# Patient Record
Sex: Male | Born: 1945 | Race: White | Hispanic: No | Marital: Married | Smoking: Former smoker
Health system: Southern US, Community
[De-identification: ages and names within clinical notes are randomized; demographics above are authoritative.]

## PROBLEM LIST (undated history)

## (undated) DIAGNOSIS — M8660 Other chronic osteomyelitis, unspecified site: Secondary | ICD-10-CM

## (undated) DIAGNOSIS — L570 Actinic keratosis: Secondary | ICD-10-CM

## (undated) DIAGNOSIS — D6861 Antiphospholipid syndrome: Secondary | ICD-10-CM

## (undated) DIAGNOSIS — N400 Enlarged prostate without lower urinary tract symptoms: Secondary | ICD-10-CM

## (undated) DIAGNOSIS — G4733 Obstructive sleep apnea (adult) (pediatric): Secondary | ICD-10-CM

## (undated) DIAGNOSIS — R5383 Other fatigue: Secondary | ICD-10-CM

## (undated) DIAGNOSIS — G2581 Restless legs syndrome: Secondary | ICD-10-CM

## (undated) DIAGNOSIS — E785 Hyperlipidemia, unspecified: Secondary | ICD-10-CM

## (undated) DIAGNOSIS — H532 Diplopia: Secondary | ICD-10-CM

## (undated) DIAGNOSIS — M5410 Radiculopathy, site unspecified: Secondary | ICD-10-CM

## (undated) DIAGNOSIS — M069 Rheumatoid arthritis, unspecified: Secondary | ICD-10-CM

## (undated) DIAGNOSIS — M4802 Spinal stenosis, cervical region: Secondary | ICD-10-CM

## (undated) DIAGNOSIS — M5417 Radiculopathy, lumbosacral region: Secondary | ICD-10-CM

## (undated) DIAGNOSIS — I2510 Atherosclerotic heart disease of native coronary artery without angina pectoris: Secondary | ICD-10-CM

## (undated) DIAGNOSIS — I739 Peripheral vascular disease, unspecified: Secondary | ICD-10-CM

## (undated) DIAGNOSIS — G4700 Insomnia, unspecified: Secondary | ICD-10-CM

## (undated) DIAGNOSIS — E559 Vitamin D deficiency, unspecified: Secondary | ICD-10-CM

## (undated) DIAGNOSIS — L0390 Cellulitis, unspecified: Secondary | ICD-10-CM

## (undated) DIAGNOSIS — F1010 Alcohol abuse, uncomplicated: Secondary | ICD-10-CM

## (undated) DIAGNOSIS — L299 Pruritus, unspecified: Secondary | ICD-10-CM

## (undated) DIAGNOSIS — N189 Chronic kidney disease, unspecified: Secondary | ICD-10-CM

## (undated) DIAGNOSIS — A0472 Enterocolitis due to Clostridium difficile, not specified as recurrent: Secondary | ICD-10-CM

## (undated) DIAGNOSIS — F329 Major depressive disorder, single episode, unspecified: Secondary | ICD-10-CM

## (undated) DIAGNOSIS — M329 Systemic lupus erythematosus, unspecified: Secondary | ICD-10-CM

## (undated) DIAGNOSIS — D802 Selective deficiency of immunoglobulin A [IgA]: Secondary | ICD-10-CM

## (undated) DIAGNOSIS — I10 Essential (primary) hypertension: Secondary | ICD-10-CM

## (undated) DIAGNOSIS — C4491 Basal cell carcinoma of skin, unspecified: Secondary | ICD-10-CM

## (undated) DIAGNOSIS — K589 Irritable bowel syndrome without diarrhea: Secondary | ICD-10-CM

## (undated) DIAGNOSIS — H35383 Toxic maculopathy, bilateral: Secondary | ICD-10-CM

## (undated) DIAGNOSIS — K219 Gastro-esophageal reflux disease without esophagitis: Secondary | ICD-10-CM

## (undated) DIAGNOSIS — M869 Osteomyelitis, unspecified: Secondary | ICD-10-CM

## (undated) DIAGNOSIS — I639 Cerebral infarction, unspecified: Secondary | ICD-10-CM

## (undated) DIAGNOSIS — F32A Depression, unspecified: Secondary | ICD-10-CM

## (undated) HISTORY — PX: CARDIAC CATHETERIZATION: SHX172

## (undated) HISTORY — PX: OTHER SURGICAL HISTORY: SHX169

## (undated) HISTORY — PX: HERNIA REPAIR: SHX51

## (undated) HISTORY — PX: CORONARY ANGIOPLASTY: SHX604

## (undated) HISTORY — DX: Actinic keratosis: L57.0

## (undated) HISTORY — DX: Basal cell carcinoma of skin, unspecified: C44.91

## (undated) HISTORY — PX: KNEE ARTHROSCOPY: SHX127

## (undated) HISTORY — PX: TONSILLECTOMY: SUR1361

## (undated) HISTORY — PX: CERVICAL LAMINECTOMY: SHX94

## (undated) HISTORY — PX: CAROTID ENDARTERECTOMY: SUR193

## (undated) HISTORY — PX: FRACTURE SURGERY: SHX138

---

## 2008-10-15 ENCOUNTER — Ambulatory Visit: Payer: Self-pay | Admitting: Internal Medicine

## 2010-11-03 DIAGNOSIS — I2510 Atherosclerotic heart disease of native coronary artery without angina pectoris: Secondary | ICD-10-CM | POA: Insufficient documentation

## 2010-11-03 DIAGNOSIS — I739 Peripheral vascular disease, unspecified: Secondary | ICD-10-CM | POA: Insufficient documentation

## 2010-11-03 DIAGNOSIS — I10 Essential (primary) hypertension: Secondary | ICD-10-CM | POA: Insufficient documentation

## 2010-11-03 DIAGNOSIS — M329 Systemic lupus erythematosus, unspecified: Secondary | ICD-10-CM | POA: Insufficient documentation

## 2011-12-20 DIAGNOSIS — N183 Chronic kidney disease, stage 3 unspecified: Secondary | ICD-10-CM | POA: Insufficient documentation

## 2012-01-23 ENCOUNTER — Ambulatory Visit: Payer: Self-pay | Admitting: Podiatry

## 2012-01-23 LAB — BASIC METABOLIC PANEL
Anion Gap: 6 — ABNORMAL LOW (ref 7–16)
BUN: 29 mg/dL — ABNORMAL HIGH (ref 7–18)
Calcium, Total: 8.8 mg/dL (ref 8.5–10.1)
Chloride: 103 mmol/L (ref 98–107)
Co2: 30 mmol/L (ref 21–32)
Creatinine: 1.1 mg/dL (ref 0.60–1.30)
EGFR (African American): >60
EGFR (Non-African Amer.): >60
Glucose: 95 mg/dL (ref 65–99)
Osmolality: 283 (ref 275–301)
Potassium: 3.9 mmol/L (ref 3.5–5.1)
Sodium: 139 mmol/L (ref 136–145)

## 2012-01-23 LAB — CBC WITH DIFFERENTIAL/PLATELET
Basophil #: 0 10*3/uL (ref 0.0–0.1)
Basophil %: 0.1 %
Eosinophil #: 0 10*3/uL (ref 0.0–0.7)
Eosinophil %: 0.2 %
HCT: 36.5 % — ABNORMAL LOW (ref 40.0–52.0)
HGB: 12.4 g/dL — ABNORMAL LOW (ref 13.0–18.0)
Lymphocyte #: 2.6 10*3/uL (ref 1.0–3.6)
Lymphocyte %: 26.7 %
MCH: 33.6 pg (ref 26.0–34.0)
MCHC: 33.9 g/dL (ref 32.0–36.0)
MCV: 99 fL (ref 80–100)
Monocyte #: 1 x10 3/mm (ref 0.2–1.0)
Monocyte %: 10.7 %
Neutrophil #: 6 10*3/uL (ref 1.4–6.5)
Neutrophil %: 62.3 %
Platelet: 153 10*3/uL (ref 150–440)
RBC: 3.69 10*6/uL — ABNORMAL LOW (ref 4.40–5.90)
RDW: 15.8 % — ABNORMAL HIGH (ref 11.5–14.5)
WBC: 9.7 10*3/uL (ref 3.8–10.6)

## 2012-01-26 ENCOUNTER — Ambulatory Visit: Payer: Self-pay | Admitting: Podiatry

## 2012-01-29 LAB — PATHOLOGY REPORT

## 2012-01-30 LAB — WOUND CULTURE

## 2012-02-02 ENCOUNTER — Ambulatory Visit: Payer: Self-pay

## 2012-02-06 ENCOUNTER — Ambulatory Visit: Payer: Self-pay

## 2012-02-16 LAB — CULTURE, FUNGUS WITHOUT SMEAR

## 2012-03-26 ENCOUNTER — Ambulatory Visit: Payer: Self-pay | Admitting: Anesthesiology

## 2012-03-27 ENCOUNTER — Ambulatory Visit: Payer: Self-pay | Admitting: Podiatry

## 2012-07-26 DIAGNOSIS — E559 Vitamin D deficiency, unspecified: Secondary | ICD-10-CM | POA: Insufficient documentation

## 2013-01-07 DIAGNOSIS — F32A Depression, unspecified: Secondary | ICD-10-CM | POA: Insufficient documentation

## 2013-01-07 DIAGNOSIS — G2581 Restless legs syndrome: Secondary | ICD-10-CM | POA: Insufficient documentation

## 2013-01-07 DIAGNOSIS — N400 Enlarged prostate without lower urinary tract symptoms: Secondary | ICD-10-CM | POA: Insufficient documentation

## 2013-01-15 DIAGNOSIS — R531 Weakness: Secondary | ICD-10-CM | POA: Insufficient documentation

## 2013-01-16 DIAGNOSIS — M4802 Spinal stenosis, cervical region: Secondary | ICD-10-CM | POA: Insufficient documentation

## 2013-01-16 DIAGNOSIS — M4310 Spondylolisthesis, site unspecified: Secondary | ICD-10-CM | POA: Insufficient documentation

## 2013-04-07 ENCOUNTER — Inpatient Hospital Stay: Payer: Self-pay | Admitting: Family Medicine

## 2013-04-07 LAB — COMPREHENSIVE METABOLIC PANEL
Albumin: 2.5 g/dL — ABNORMAL LOW (ref 3.4–5.0)
Alkaline Phosphatase: 256 U/L — ABNORMAL HIGH
Anion Gap: 6 — ABNORMAL LOW (ref 7–16)
BUN: 23 mg/dL — ABNORMAL HIGH (ref 7–18)
Bilirubin,Total: 0.5 mg/dL (ref 0.2–1.0)
Calcium, Total: 8.8 mg/dL (ref 8.5–10.1)
Chloride: 98 mmol/L (ref 98–107)
Co2: 25 mmol/L (ref 21–32)
Creatinine: 1.46 mg/dL — ABNORMAL HIGH (ref 0.60–1.30)
EGFR (African American): 56 — ABNORMAL LOW
EGFR (Non-African Amer.): 49 — ABNORMAL LOW
Glucose: 117 mg/dL — ABNORMAL HIGH (ref 65–99)
Osmolality: 264 (ref 275–301)
Potassium: 4.1 mmol/L (ref 3.5–5.1)
SGOT(AST): 23 U/L (ref 15–37)
SGPT (ALT): 19 U/L (ref 12–78)
Sodium: 129 mmol/L — ABNORMAL LOW (ref 136–145)
Total Protein: 7.4 g/dL (ref 6.4–8.2)

## 2013-04-07 LAB — URINALYSIS, COMPLETE
Bacteria: NONE SEEN
Bilirubin,UR: NEGATIVE
Glucose,UR: NEGATIVE mg/dL (ref 0–75)
Ketone: NEGATIVE
Nitrite: NEGATIVE
Ph: 5 (ref 4.5–8.0)
Protein: 100
RBC,UR: 302 /HPF (ref 0–5)
Specific Gravity: 1.016 (ref 1.003–1.030)
Squamous Epithelial: NONE SEEN
WBC UR: 1208 /HPF (ref 0–5)

## 2013-04-07 LAB — CBC
HCT: 34.3 % — ABNORMAL LOW (ref 40.0–52.0)
HGB: 10.7 g/dL — ABNORMAL LOW (ref 13.0–18.0)
MCH: 25.6 pg — ABNORMAL LOW (ref 26.0–34.0)
MCHC: 31.2 g/dL — ABNORMAL LOW (ref 32.0–36.0)
MCV: 82 fL (ref 80–100)
Platelet: 218 10*3/uL (ref 150–440)
RBC: 4.19 10*6/uL — ABNORMAL LOW (ref 4.40–5.90)
RDW: 14.8 % — ABNORMAL HIGH (ref 11.5–14.5)
WBC: 22.2 10*3/uL — ABNORMAL HIGH (ref 3.8–10.6)

## 2013-04-08 LAB — COMPREHENSIVE METABOLIC PANEL
Albumin: 1.9 g/dL — ABNORMAL LOW (ref 3.4–5.0)
Alkaline Phosphatase: 197 U/L — ABNORMAL HIGH
Anion Gap: 7 (ref 7–16)
BUN: 16 mg/dL (ref 7–18)
Bilirubin,Total: 0.6 mg/dL (ref 0.2–1.0)
Calcium, Total: 8.3 mg/dL — ABNORMAL LOW (ref 8.5–10.1)
Chloride: 99 mmol/L (ref 98–107)
Co2: 25 mmol/L (ref 21–32)
Creatinine: 1.32 mg/dL — ABNORMAL HIGH (ref 0.60–1.30)
EGFR (African American): >60
EGFR (Non-African Amer.): 55 — ABNORMAL LOW
Glucose: 64 mg/dL — ABNORMAL LOW (ref 65–99)
Osmolality: 262 (ref 275–301)
Potassium: 3.5 mmol/L (ref 3.5–5.1)
SGOT(AST): 14 U/L — ABNORMAL LOW (ref 15–37)
SGPT (ALT): 18 U/L (ref 12–78)
Sodium: 131 mmol/L — ABNORMAL LOW (ref 136–145)
Total Protein: 6 g/dL — ABNORMAL LOW (ref 6.4–8.2)

## 2013-04-08 LAB — CBC WITH DIFFERENTIAL/PLATELET
Basophil #: 0 10*3/uL (ref 0.0–0.1)
Basophil %: 0.3 %
Eosinophil #: 0.2 10*3/uL (ref 0.0–0.7)
Eosinophil %: 1.7 %
HCT: 28.9 % — ABNORMAL LOW (ref 40.0–52.0)
HGB: 9.4 g/dL — ABNORMAL LOW (ref 13.0–18.0)
Lymphocyte #: 3.5 10*3/uL (ref 1.0–3.6)
Lymphocyte %: 24.2 %
MCH: 26.4 pg (ref 26.0–34.0)
MCHC: 32.7 g/dL (ref 32.0–36.0)
MCV: 81 fL (ref 80–100)
Monocyte #: 1.9 x10 3/mm — ABNORMAL HIGH (ref 0.2–1.0)
Monocyte %: 13.1 %
Neutrophil #: 8.8 10*3/uL — ABNORMAL HIGH (ref 1.4–6.5)
Neutrophil %: 60.7 %
Platelet: 176 10*3/uL (ref 150–440)
RBC: 3.57 10*6/uL — ABNORMAL LOW (ref 4.40–5.90)
RDW: 14.9 % — ABNORMAL HIGH (ref 11.5–14.5)
WBC: 14.2 10*3/uL — ABNORMAL HIGH (ref 3.8–10.6)

## 2013-04-08 LAB — LIPID PANEL
Cholesterol: 121 mg/dL (ref 0–200)
HDL Cholesterol: 29 mg/dL — ABNORMAL LOW (ref 40–60)
Ldl Cholesterol, Calc: 75 mg/dL (ref 0–100)
Triglycerides: 86 mg/dL (ref 0–200)
VLDL Cholesterol, Calc: 17 mg/dL (ref 5–40)

## 2013-04-08 LAB — MAGNESIUM: Magnesium: 1.3 mg/dL — ABNORMAL LOW

## 2013-04-09 LAB — CBC WITH DIFFERENTIAL/PLATELET
Basophil #: 0 10*3/uL (ref 0.0–0.1)
Basophil %: 0.4 %
Eosinophil #: 0.3 10*3/uL (ref 0.0–0.7)
Eosinophil %: 2.6 %
HCT: 30 % — ABNORMAL LOW (ref 40.0–52.0)
HGB: 9.9 g/dL — ABNORMAL LOW (ref 13.0–18.0)
Lymphocyte #: 3.2 10*3/uL (ref 1.0–3.6)
Lymphocyte %: 29.8 %
MCH: 26.8 pg (ref 26.0–34.0)
MCHC: 33.1 g/dL (ref 32.0–36.0)
MCV: 81 fL (ref 80–100)
Monocyte #: 1.3 x10 3/mm — ABNORMAL HIGH (ref 0.2–1.0)
Monocyte %: 12.3 %
Neutrophil #: 5.9 10*3/uL (ref 1.4–6.5)
Neutrophil %: 54.9 %
Platelet: 206 10*3/uL (ref 150–440)
RBC: 3.7 10*6/uL — ABNORMAL LOW (ref 4.40–5.90)
RDW: 15 % — ABNORMAL HIGH (ref 11.5–14.5)
WBC: 10.7 10*3/uL — ABNORMAL HIGH (ref 3.8–10.6)

## 2013-04-09 LAB — COMPREHENSIVE METABOLIC PANEL
Albumin: 1.9 g/dL — ABNORMAL LOW (ref 3.4–5.0)
Alkaline Phosphatase: 187 U/L — ABNORMAL HIGH
Anion Gap: 6 — ABNORMAL LOW (ref 7–16)
BUN: 14 mg/dL (ref 7–18)
Bilirubin,Total: 0.6 mg/dL (ref 0.2–1.0)
Calcium, Total: 8.5 mg/dL (ref 8.5–10.1)
Chloride: 103 mmol/L (ref 98–107)
Co2: 26 mmol/L (ref 21–32)
Creatinine: 1.41 mg/dL — ABNORMAL HIGH (ref 0.60–1.30)
EGFR (African American): 59 — ABNORMAL LOW
EGFR (Non-African Amer.): 51 — ABNORMAL LOW
Glucose: 75 mg/dL (ref 65–99)
Osmolality: 269 (ref 275–301)
Potassium: 3.7 mmol/L (ref 3.5–5.1)
SGOT(AST): 14 U/L — ABNORMAL LOW (ref 15–37)
SGPT (ALT): 16 U/L (ref 12–78)
Sodium: 135 mmol/L — ABNORMAL LOW (ref 136–145)
Total Protein: 6.2 g/dL — ABNORMAL LOW (ref 6.4–8.2)

## 2013-04-09 LAB — MAGNESIUM: Magnesium: 1.7 mg/dL — ABNORMAL LOW

## 2013-04-10 LAB — BASIC METABOLIC PANEL
Anion Gap: 5 — ABNORMAL LOW (ref 7–16)
BUN: 12 mg/dL (ref 7–18)
Calcium, Total: 7.6 mg/dL — ABNORMAL LOW (ref 8.5–10.1)
Chloride: 103 mmol/L (ref 98–107)
Co2: 25 mmol/L (ref 21–32)
Creatinine: 1.33 mg/dL — ABNORMAL HIGH (ref 0.60–1.30)
EGFR (African American): >60
EGFR (Non-African Amer.): 55 — ABNORMAL LOW
Glucose: 78 mg/dL (ref 65–99)
Osmolality: 265 (ref 275–301)
Potassium: 3.7 mmol/L (ref 3.5–5.1)
Sodium: 133 mmol/L — ABNORMAL LOW (ref 136–145)

## 2013-04-10 LAB — CBC WITH DIFFERENTIAL/PLATELET
Basophil #: 0 10*3/uL (ref 0.0–0.1)
Basophil %: 0.5 %
Eosinophil #: 0.3 10*3/uL (ref 0.0–0.7)
Eosinophil %: 3.1 %
HCT: 26.2 % — ABNORMAL LOW (ref 40.0–52.0)
HGB: 9.1 g/dL — ABNORMAL LOW (ref 13.0–18.0)
Lymphocyte #: 3.3 10*3/uL (ref 1.0–3.6)
Lymphocyte %: 37.9 %
MCH: 27.9 pg (ref 26.0–34.0)
MCHC: 34.7 g/dL (ref 32.0–36.0)
MCV: 80 fL (ref 80–100)
Monocyte #: 1.3 x10 3/mm — ABNORMAL HIGH (ref 0.2–1.0)
Monocyte %: 14.7 %
Neutrophil #: 3.8 10*3/uL (ref 1.4–6.5)
Neutrophil %: 43.8 %
Platelet: 200 10*3/uL (ref 150–440)
RBC: 3.26 10*6/uL — ABNORMAL LOW (ref 4.40–5.90)
RDW: 14.9 % — ABNORMAL HIGH (ref 11.5–14.5)
WBC: 8.7 10*3/uL (ref 3.8–10.6)

## 2013-04-11 LAB — URINE CULTURE

## 2013-04-12 LAB — BASIC METABOLIC PANEL
Anion Gap: 6 — ABNORMAL LOW (ref 7–16)
BUN: 8 mg/dL (ref 7–18)
Calcium, Total: 8.1 mg/dL — ABNORMAL LOW (ref 8.5–10.1)
Chloride: 104 mmol/L (ref 98–107)
Co2: 25 mmol/L (ref 21–32)
Creatinine: 1.34 mg/dL — ABNORMAL HIGH (ref 0.60–1.30)
EGFR (African American): >60
EGFR (Non-African Amer.): 54 — ABNORMAL LOW
Glucose: 73 mg/dL (ref 65–99)
Osmolality: 267 (ref 275–301)
Potassium: 4 mmol/L (ref 3.5–5.1)
Sodium: 135 mmol/L — ABNORMAL LOW (ref 136–145)

## 2013-04-12 LAB — CBC WITH DIFFERENTIAL/PLATELET
Basophil #: 0.1 10*3/uL (ref 0.0–0.1)
Basophil %: 0.5 %
Eosinophil #: 0.3 10*3/uL (ref 0.0–0.7)
Eosinophil %: 2.9 %
HCT: 29 % — ABNORMAL LOW (ref 40.0–52.0)
HGB: 9.5 g/dL — ABNORMAL LOW (ref 13.0–18.0)
Lymphocyte #: 3.3 10*3/uL (ref 1.0–3.6)
Lymphocyte %: 30.1 %
MCH: 26.3 pg (ref 26.0–34.0)
MCHC: 32.7 g/dL (ref 32.0–36.0)
MCV: 80 fL (ref 80–100)
Monocyte #: 0.9 x10 3/mm (ref 0.2–1.0)
Monocyte %: 8.1 %
Neutrophil #: 6.5 10*3/uL (ref 1.4–6.5)
Neutrophil %: 58.4 %
Platelet: 267 10*3/uL (ref 150–440)
RBC: 3.6 10*6/uL — ABNORMAL LOW (ref 4.40–5.90)
RDW: 15.1 % — ABNORMAL HIGH (ref 11.5–14.5)
WBC: 11.1 10*3/uL — ABNORMAL HIGH (ref 3.8–10.6)

## 2013-04-12 LAB — CULTURE, BLOOD (SINGLE)

## 2013-04-13 ENCOUNTER — Ambulatory Visit: Payer: Self-pay

## 2013-04-13 LAB — CBC WITH DIFFERENTIAL/PLATELET
Basophil #: 0.1 10*3/uL (ref 0.0–0.1)
Basophil %: 0.8 %
Eosinophil #: 0.2 10*3/uL (ref 0.0–0.7)
Eosinophil %: 2.5 %
HCT: 29.4 % — ABNORMAL LOW (ref 40.0–52.0)
HGB: 9.5 g/dL — ABNORMAL LOW (ref 13.0–18.0)
Lymphocyte #: 3.4 10*3/uL (ref 1.0–3.6)
Lymphocyte %: 34.2 %
MCH: 25.8 pg — ABNORMAL LOW (ref 26.0–34.0)
MCHC: 32.2 g/dL (ref 32.0–36.0)
MCV: 80 fL (ref 80–100)
Monocyte #: 0.9 x10 3/mm (ref 0.2–1.0)
Monocyte %: 8.9 %
Neutrophil #: 5.3 10*3/uL (ref 1.4–6.5)
Neutrophil %: 53.6 %
Platelet: 279 10*3/uL (ref 150–440)
RBC: 3.67 10*6/uL — ABNORMAL LOW (ref 4.40–5.90)
RDW: 15.1 % — ABNORMAL HIGH (ref 11.5–14.5)
WBC: 9.9 10*3/uL (ref 3.8–10.6)

## 2013-04-13 LAB — BASIC METABOLIC PANEL
Anion Gap: 4 — ABNORMAL LOW (ref 7–16)
BUN: 6 mg/dL — ABNORMAL LOW (ref 7–18)
Calcium, Total: 8.1 mg/dL — ABNORMAL LOW (ref 8.5–10.1)
Chloride: 104 mmol/L (ref 98–107)
Co2: 25 mmol/L (ref 21–32)
Creatinine: 1.25 mg/dL (ref 0.60–1.30)
EGFR (African American): >60
EGFR (Non-African Amer.): 59 — ABNORMAL LOW
Glucose: 90 mg/dL (ref 65–99)
Osmolality: 264 (ref 275–301)
Potassium: 3.5 mmol/L (ref 3.5–5.1)
Sodium: 133 mmol/L — ABNORMAL LOW (ref 136–145)

## 2013-04-18 LAB — CBC
HCT: 31.3 % — ABNORMAL LOW (ref 40.0–52.0)
HGB: 10.3 g/dL — ABNORMAL LOW (ref 13.0–18.0)
MCH: 26.3 pg (ref 26.0–34.0)
MCHC: 33 g/dL (ref 32.0–36.0)
MCV: 80 fL (ref 80–100)
Platelet: 423 10*3/uL (ref 150–440)
RBC: 3.93 10*6/uL — ABNORMAL LOW (ref 4.40–5.90)
RDW: 15 % — ABNORMAL HIGH (ref 11.5–14.5)
WBC: 9.1 10*3/uL (ref 3.8–10.6)

## 2013-04-18 LAB — BASIC METABOLIC PANEL
Anion Gap: 5 — ABNORMAL LOW (ref 7–16)
BUN: 11 mg/dL (ref 7–18)
Calcium, Total: 8.7 mg/dL (ref 8.5–10.1)
Chloride: 104 mmol/L (ref 98–107)
Co2: 27 mmol/L (ref 21–32)
Creatinine: 1.24 mg/dL (ref 0.60–1.30)
EGFR (African American): >60
EGFR (Non-African Amer.): 59 — ABNORMAL LOW
Glucose: 72 mg/dL (ref 65–99)
Osmolality: 270 (ref 275–301)
Potassium: 4 mmol/L (ref 3.5–5.1)
Sodium: 136 mmol/L (ref 136–145)

## 2013-04-18 LAB — URINALYSIS, COMPLETE
Bacteria: NONE SEEN
Bilirubin,UR: NEGATIVE
Glucose,UR: NEGATIVE mg/dL (ref 0–75)
Nitrite: NEGATIVE
Ph: 5 (ref 4.5–8.0)
Protein: NEGATIVE
RBC,UR: 4 /HPF (ref 0–5)
Specific Gravity: 1.009 (ref 1.003–1.030)
Squamous Epithelial: NONE SEEN
WBC UR: 65 /HPF (ref 0–5)

## 2013-04-19 ENCOUNTER — Inpatient Hospital Stay: Payer: Self-pay

## 2013-04-19 LAB — CLOSTRIDIUM DIFFICILE(ARMC)

## 2013-04-20 LAB — WBCS, STOOL

## 2013-04-21 LAB — CBC WITH DIFFERENTIAL/PLATELET
Basophil #: 0 10*3/uL (ref 0.0–0.1)
Basophil %: 0.5 %
Eosinophil #: 0.1 10*3/uL (ref 0.0–0.7)
Eosinophil %: 1.8 %
HCT: 28 % — ABNORMAL LOW (ref 40.0–52.0)
HGB: 9.1 g/dL — ABNORMAL LOW (ref 13.0–18.0)
Lymphocyte #: 3.5 10*3/uL (ref 1.0–3.6)
Lymphocyte %: 48 %
MCH: 25.9 pg — ABNORMAL LOW (ref 26.0–34.0)
MCHC: 32.5 g/dL (ref 32.0–36.0)
MCV: 80 fL (ref 80–100)
Monocyte #: 0.6 x10 3/mm (ref 0.2–1.0)
Monocyte %: 8.8 %
Neutrophil #: 3 10*3/uL (ref 1.4–6.5)
Neutrophil %: 40.9 %
Platelet: 399 10*3/uL (ref 150–440)
RBC: 3.52 10*6/uL — ABNORMAL LOW (ref 4.40–5.90)
RDW: 15 % — ABNORMAL HIGH (ref 11.5–14.5)
WBC: 7.3 10*3/uL (ref 3.8–10.6)

## 2013-04-21 LAB — COMPREHENSIVE METABOLIC PANEL
Albumin: 1.9 g/dL — ABNORMAL LOW (ref 3.4–5.0)
Alkaline Phosphatase: 180 U/L — ABNORMAL HIGH
Anion Gap: 6 — ABNORMAL LOW (ref 7–16)
BUN: 11 mg/dL (ref 7–18)
Bilirubin,Total: 0.3 mg/dL (ref 0.2–1.0)
Calcium, Total: 7.8 mg/dL — ABNORMAL LOW (ref 8.5–10.1)
Chloride: 111 mmol/L — ABNORMAL HIGH (ref 98–107)
Co2: 24 mmol/L (ref 21–32)
Creatinine: 1.1 mg/dL (ref 0.60–1.30)
EGFR (African American): >60
EGFR (Non-African Amer.): >60
Glucose: 110 mg/dL — ABNORMAL HIGH (ref 65–99)
Osmolality: 281 (ref 275–301)
Potassium: 3.9 mmol/L (ref 3.5–5.1)
SGOT(AST): 12 U/L — ABNORMAL LOW (ref 15–37)
SGPT (ALT): 9 U/L — ABNORMAL LOW (ref 12–78)
Sodium: 141 mmol/L (ref 136–145)
Total Protein: 6.3 g/dL — ABNORMAL LOW (ref 6.4–8.2)

## 2013-04-21 LAB — STOOL CULTURE

## 2013-04-23 LAB — URINALYSIS, COMPLETE
Bilirubin,UR: NEGATIVE
Blood: NEGATIVE
Glucose,UR: NEGATIVE mg/dL (ref 0–75)
Ketone: NEGATIVE
Nitrite: NEGATIVE
Ph: 6 (ref 4.5–8.0)
Protein: NEGATIVE
RBC,UR: 2 /HPF (ref 0–5)
Specific Gravity: 1.004 (ref 1.003–1.030)
Squamous Epithelial: <1
WBC UR: 25 /HPF (ref 0–5)

## 2013-04-26 LAB — URINE CULTURE

## 2013-06-19 DIAGNOSIS — H539 Unspecified visual disturbance: Secondary | ICD-10-CM | POA: Insufficient documentation

## 2014-05-26 NOTE — Op Note (Signed)
PATIENT NAME:  Kenneth Summers, Kenneth Summers MR#:  [id number] DATE OF BIRTH:  [birth year]  DATE OF PROCEDURE:  01/26/2012  PREOPERATIVE DIAGNOSIS: Left mid-foot septic nonunion.   POSTOPERATIVE DIAGNOSIS: Left mid-foot septic nonunion.   PROCEDURE PERFORMED: Fusion left first and second metatarsal-cuneiform joint and mid-foot with external fixation and bone graft.   SURGEON: [facility] A. Vickki Muff, DPM   ANESTHESIA: General.   HEMOSTASIS: Ankle tourniquet inflated to 250 mmHg for 125 minutes.   COMPLICATIONS: None.   SPECIMEN: Bone and tissue for pathology, as well as for cultures.   OPERATIVE INDICATIONS: This is a 69 year old gentleman who has had a septic left mid-foot nonunion for some time now. He has undergone conservative therapy. He has had multiple surgeries on this as well and presents today for surgical attempt at fusion with implantation of antibiotic bone grafting. All risks, benefits, alternatives, and complications associated with the surgery were discussed with the patient and full consent has been given.   OPERATIVE PROCEDURE: The patient was brought into the OR and placed on the operating room table in supine position. General intubation was administered by the anesthesia team. The left lower extremity was prepped and draped in the usual sterile fashion. Attention was directed to the left medial mid-foot where the previous incision with drainage was noted. A full thickness incision was made down to bone. The joint was then noted at this time and there was some purulent drainage from the joint area. I then debrided this out with the use of a rongeur. Bone was sent for pathological examination, as well as for cultures. Next, a second incision was made dorsally over the second met-cuneiform joint region. Sharp and blunt dissection was taken down to the second met-cuneiform joint. At this time, there was noted to be loose bone and nonunion in this region, and this was removed for examination. All bone  was taken down to bony bleeding at this point with good residual bone left. There was no necrotic bone in the area. This was with use of a power saw and rongeur. At this time, the wound was flushed with copious amounts of irrigation. This was then packed with tobramycin and vancomycin-impregnated calcium phosphate from [employer] as a bone void filler for antibiotic delivery. It was also further packed with cancellous bone chips. The wound was noted to be quite stable. I was able to get good compression. At this time, 2 MiniRail external fixators were placed externally. The first was in the second met-cuneiform joint. The second was in the first met-cuneiform joint. Good compression was noted with the rails. At this time, all wounds were closed in layered fashions. Marcaine 0.5% block was placed around the foot. He tolerated the procedure and anesthesia well, and was placed in a well compressive sterile dressing. I will see him in the outpatient clinic in 5 to 7 days. He is to go for a PICC line today. He has seen Infectious Disease already.    ____________________________ Pete Glatter Vickki Muff, DPM jaf:es D: 01/26/2012 10:17:31 ET T: 01/26/2012 15:26:04 ET JOB#: [id number]  cc: Larkin Ina A. Vickki Muff, DPM, <Dictator> Tanaisha Pittman DPM ELECTRONICALLY SIGNED 02/02/2012 9:27

## 2014-05-30 NOTE — H&P (Signed)
PATIENT NAME:  Kenneth, Summers MR#:  [id number] DATE OF BIRTH:  November 22, 1945  DATE OF ADMISSION:  04/07/2013  PRIMARY CARE PHYSICIAN: Adrian Prows, MD.   REFERRING PHYSICIAN: Dr. Joni Fears.   CHIEF COMPLAINT: Not able to walk and get up.   HISTORY OF PRESENT ILLNESS: This is a very nice 69 year old gentleman who, at the same time, is a very poor historian. He came here to the Emergency Department mostly with a history of not able to get up, move around, feeling really weak for the past 2 days.   Whenever he arrived to the Emergency Department, it was found that this has been going on for 3 years, but after a deeper examination, it seems like the patient had a urinary tract infection for which Emergency Department physician sent a CT scan of the abdomen and pelvis showing a pyelonephritis with a perinephric abscess.   The patient has been seeing a different doctor, Dr.Hardford  at [facility] in the past for prostate problems. Apparently he had a TURP recently and he is noticed to have infections with dysuria on a regular basis. The patient, again, is a very poor historian. He does not recall any other urinary problems. He does not recall having any fever, but he definitely has a good infection and we are going to admit him for treatment. I cannot get any more information out of him. His wife left. I tried to contact her, but not able to succeed.   REVIEW OF SYSTEMS:   CONSTITUTIONAL: No fever. No chills. Positive fatigue. Positive weakness. Positive chronic pain everywhere, mostly on the lower extremities, not able to walk today. No significant weight gain or weight loss.  EYES: No blurry vision. No inflamation.  ENT:  No difficulty swallowing. No tinnitus.  RESPIRATORY: No cough, wheezing or COPD.  CARDIOVASCULAR: No chest pain or orthopnea. Apparently, the patient has a history of coronary artery disease, although he does not mention that when asked.  GASTROINTESTINAL: No nausea, vomiting,  abdominal pain, constipation, diarrhea. Apparently he takes cholestyramine every morning, otherwise he will have loose bowel movements, but he has not had as long as he has taken that.  GENITOURINARY: No dysuria, hematuria, changes in frequency. He has prostate problems.  ENDOCRINE: No polyuria, polydipsia, polyphagia, cold or heat intolerance.  HEMATOLOGIC, LYMPHATIC: No anemia, easy bruising or bleeding.  SKIN: No rashes, petechiaeor new lesions.  MUSCULOSKELETAL: Neck pain, back pain, joint pain, lower extremity pain, weakness all over his body.  NEUROLOGIC: No numbness, tingling, CVAs or TIA.  PSYCHIATRIC: No significant insomnia or depression.  RHEUMATOLOGIC: The patient has lupus, although he does not mention that when asked.   PAST MEDICAL HISTORY:  1.  Chronic pain.  2.  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  3.  Neck pain due to DJD.  4.  Urinary tract infections.  5.  BPH.  6.  Coronary artery disease, medical treatment.  7.  Lupus.  8.  History of TIA.  9.  Peripheral vascular disease with carotid artery disease, status post endarterectomy.   PAST SURGICAL HISTORY:  1.  C-spine fusion.  2.  Lower back surgery.  3.  TURP.  4.  Toe fusion. 5.  Carotid endarterectomy at the level of the right.   FAMILY HISTORY: Negative for MI. Negative for cancer. His parents died from old age.   SOCIAL HISTORY: He used to smoke previously, but he quit 30 years ago. EtOH is positive; every night he drinks 1 small shot of brandy  to go to sleep. He lives with his wife. He is retired from [employer].   CURRENT MEDICATIONS: Trazodone 50 mg once a day, Flomax 0.4 mg once a day, sucralfate 1 g twice daily, ropinirole 0.5 mg 2 tablets once a day, oxycodone 5 mg 1 to 3 tablets every 4 hours, nortriptyline 25 mg once a day, magnesium oxide 250 mg once a day, gabapentin 400 at 3 times daily, fluoxetine 40 mg once a day, finasteride 5 mg once a day, cholestyramine 4 g 2 times daily, atenolol 25 mg once a  day.   PHYSICAL EXAMINATION:  VITAL SIGNS: Blood pressure 170/80, pulse 90 to 102, respirations 18, temperature 98.4, oxygen saturation 100% on room air.  GENERAL: Alert, oriented x 3. Although he is a very poor historian, he is able to answer most of the questions.  HEENT: Pupils are equal and reactive. Extraocular movements are intact. Mucosae are moist. Anicteric sclerae. Pink conjunctivae. No oral lesions. No oropharyngeal exudates.  NECK: Supple. No JVD. No thyromegaly. No adenopathy. No carotid bruits.  CARDIOVASCULAR: Tachycardic. Regular rate and rhythm. No murmurs, rubs or gallops.  LUNGS: Clear without any wheezing or crepitus. No use of accessory muscles.  ABDOMEN: Soft. Tender to palpation at the level of the left flank. Left costovertebral angle tenderness. No rebound tenderness.  GENITAL: Negative for external lesions.  EXTREMITIES: No cyanosis or clubbing. Pulses +2. Capillary refill less than 3. Trace edema of the lower extremities actually.  SKIN: No rashes or petechiae. No jaundice.  PSYCHIATRIC: No agitation. The patient is alert and oriented x 3.  NEUROLOGIC: Cranial nerves II to XII intact. Strength is 5/5 in upper extremities, 4/5 in lower extremities, but it is more like a subjective weakness. No cerebellar problems. DTRs +2.  LYMPHATIC: Negative for lymphadenopathy in the neck or supraclavicular areas.  MUSCULOSKELETAL: No joint effusion or joint swelling. No tenderness to palpation of cervical or lumbar spine at this moment.   LABORATORY DATA: Glucose 117, BUN 23, creatinine 1.46. Previous creatinine is 1.1. Sodium 129, potassium 4.1. GFR is around 50. Albumin 2.9, alkaline phosphatase 256. Normal AST and ALT. White blood count is 22,000. Hemoglobin is 10, platelet count 282.   The patient has history of foot wound that grew out methicillin-resistant streptococcus in the past, back in 2013.   His urinalysis shows 12,000 white blood cells, 300 red blood cells, +2  leukocyte esterase.   EKG: No ST depression or elevation. Normal sinus rhythm, tachycardic.   CT scan of the abdomen and pelvis with contrast shows poor definition of the mid pole of the cortex of the medullary parenchyma on the left, which is worrisome for pyelonephritis and possibly of a focal renal abscess. He has a stent placement proximally with a coil in the urinary bladder. No hydronephrosis. Status post TURP defect on the prostate. Mild thickening of the urinary bladder wall. No hepatobiliary problems.   ASSESSMENT AND PLAN: A 69 year old gentleman with a history of multiple urinary tract infections, comes with pyelonephritis and possible abscess.  1.  Pyelonephritic abscess and pyelonephritis. The patient switched antibiotics from Rocephin to Zosyn to give better coverage, especially in the sense of penetration of the kidney.  2.  The patient has a history of multiple resistant organisms, methicillin-resistant Staphylococcus aureus and multiple urinary infections for what he should be covered for pseudomonas as well. The patient is a patient of Dr. Ola Spurr. We are going to transfer care to Dr. Ola Spurr. Blood cultures taken. Urinalysis taken. Urology consulted. If  there really is an abscess, possibility of CT-guided drainage in the near future.  3.  Sepsis secondary to urine. He has elevation of white blood count above 12,000 and tachycardic above 100. The patient is polycultured, mostly urine and blood, and treated with broad-spectrum antibiotics. He has a history of methicillin-resistant Staphylococcus aureus wound on the foot in the past, but at this moment I do not think that that should be the cause of this abscess. Consider making the spectrum of the antibiotics broader, adding vancomycin if there is no significant response.  4.  Lower back pain and lower extremity weakness. This could all be related to infection. Physical therapy consult due to the patient not able to get around very  well. The patient also needs to be seen back by his back surgeon for evaluation at some point.  5.  Coronary artery disease. At this moment, the patient is on atenolol. Continue atenolol. Continue aspirin.  6.  The patient has lupus, but he is not on any medications for lupus. No signs of exacerbation.  7.  History of transient ischemic attack. Continue aspirin.  8.  Peripheral vascular disease, status post carotid endarterectomy. No signs or symptoms of acute issues.  9.  The patient takes finasteride for benign prostatic hypertrophy. He is status post transurethral resection of prostate and apparently he has had multiple urinary tract infections.  10.  Deep vein thrombosis prophylaxis with heparin. 11.  Gastrointestinal prophylaxis with proton pump inhibitor.   TOTAL TIME SPENT: I spent about 50 minutes with this patient.   ____________________________ [place] Sink, MD rsg:np D: 04/07/2013 17:02:00 ET T: 04/07/2013 18:44:01 ET JOB#: [id number]  cc: Rosewood Sink, MD, <Dictator>  Jaque Dacy America Brown MD ELECTRONICALLY SIGNED 04/08/2013 13:22

## 2014-05-30 NOTE — Consult Note (Signed)
Brief Consult Note: Diagnosis: UTI/pyelonephritis.   Patient was seen by consultant.   Comments: Mild hypodensity may represent infection and not abscess.  Would start IV antibiotic initially.  No indication for surgical treatment or percutaneous drainage at this time.  Electronic Signatures: Abbie Sons (MD)  (Signed 02-Mar-15 15:33)  Authored: Brief Consult Note   Last Updated: 02-Mar-15 15:33 by Abbie Sons (MD)

## 2014-05-30 NOTE — Discharge Summary (Signed)
PATIENT NAME:  Kenneth Summers, Kenneth Summers MR#:  [id number] DATE OF BIRTH:  12/09/1945  DATE OF ADMISSION:  04/07/2013 DATE OF DISCHARGE:  04/14/2013  DISCHARGE DIAGNOSES: 1.  Sepsis due to pyelonephritis and renal abscess.  2.  Toxic metabolic encephalopathy.  3.  Acute on chronic renal failure.  4.  History of lupus. 5.  History of transient ischemic attacks.   HISTORY OF PRESENT ILLNESS: Please see initial history and physical for details. 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 He was admitted March 2nd with weakness. He had recently had a TURP done. It was found the patient on admission had an elevated white count and a renal abscess.   HOSPITAL COURSE BY ISSUE:  1.  Acute pyelonephritis with renal abscess. Blood cultures grew E. coli. The patient was seen by urology. He underwent removal of a stent in the left by Dr. Elnoria Howard on March 4th. Follow-up CT showed some continued inflammation, but improvement in the renal abscess. Cultures were sensitive to oral Keflex. The patient was maintained on ceftriaxone, but will be discharged on Keflex for another 14 days.  2.  Altered mental status and toxic metabolic encephalopathy. The patient was initially quite confused. He continued to improve as his infection was treated.  3.  Debility. The patient was quite weak and had been so for several days prior to admission. He was seen by physical therapy who recommended skilled nursing facility; however, the patient adamantly refused that. The patient is followed by [facility]. He agreed to return home with his wife as his caregiver. He agreed to participate in physical therapy. We did discuss that I would prefer he went to a skilled nursing facility, but again he adamantly refuses.   DISCHARGE MEDICATIONS: Please see [facility] discharge summary. New medication includes Keflex 500 mg 4 times a day for 14 days.   DISCHARGE DIET: Low sodium, regular  consistency. Encourage the patient also to take Ensure work to increase his nutritional status.  DISCHARGE FOLLOWUP: The patient will follow up with his primary care doctor and with Dr. Ola Spurr and with his urologist within 1 to 2 weeks.   TIME SPENT: 39 minutes.   ____________________________ Cheral Marker. Ola Spurr, MD dpf:sb D: 04/14/2013 08:53:43 ET T: 04/14/2013 11:13:29 ET JOB#: [id number]  cc: Cheral Marker. Ola Spurr, MD, <Dictator> Oumar Marcott Ola Spurr MD ELECTRONICALLY SIGNED 04/14/2013 19:15

## 2014-05-30 NOTE — Consult Note (Signed)
PATIENT NAME:  Kenneth Summers, Kenneth Summers MR#:  [id number] DATE OF BIRTH:  Jan 04, 1946  DATE OF CONSULTATION:  04/07/2013  REFERRING PHYSICIAN:  Dr. Joni Summers CONSULTING PHYSICIAN:  Kenneth Reaper C. Stoioff, MD  REASON FOR CONSULTATION: Pyelonephritis, possible renal abscess.   HISTORY OF PRESENT ILLNESS: A 69 year old male presented to the Emergency Department today with a 2 to 3 day history of weakness, malaise and decreased mobility. He has also complained of a generalized headache. His wife and daughter were present and supplemented the history. He apparently has had recurrent urinary tract infections since October 2014. He has most recently been followed by Kenneth Summers at [facility] in [place] and underwent a TURP on 03/25/2013, for urinary retention. He apparently had a left ureteral stent placed at the time of his TURP due to proximity of the left ureter to the prostate. He was scheduled to have this stent removed today in Kenneth Summers office, however, did not feel up to making the trip to [facility] and presented to the Emergency Department. He was seen last week  and diagnosed with a UTI and treated with oral antibiotics. His urinary retention and has resolved post TURP. He does have urinary incontinence, and it was difficult for him to explain the nature of his incontinence. He denies fever or chills.   PAST MEDICAL HISTORY: 1.  BPH with recent episode of urinary retention.  2.  Chronic low back pain secondary to degenerative joint disease.  3.  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  4.  Coronary artery disease.  5.  Lupus.  6.  History of TIA.   7.  Peripheral vascular disease.   PAST SURGICAL HISTORY:   1.  Cervical spine fusion.  2.  Low back surgery.  3.  TURP.  4.  Carotid endarterectomy - right.  5.  Foot surgery.   SOCIAL HISTORY: Occasional alcohol use, prior smoking history, quit 30 years ago.   MEDICATIONS ON ADMISSION: As per the medical reconciliation/clinical  summary.  REVIEW OF SYSTEMS:  Otherwise noncontributory except as per the HPI.   PHYSICAL EXAMINATION:  VITAL SIGNS:  BP was 170/80, pulse 94, temp 98.4.  GENERAL: Alert male in no acute distress.  ABDOMEN: Soft, nontender.  GENITOURINARY AND PROSTATE: Exam deferred at this time.   LABORATORY DATA: Creatinine is 1.46. WBC 22,000. UA with significant pyuria.   CT of the abdomen and pelvis was reviewed. There is a left ureteral stent in place. The curl of the ureteral stent was within the distal ureter. No renal calculi are identified. There is hypodensity involving the mid pole of the renal cortex laterally with some increased density in the perinephric fat with perinephric stranding. TURP defect is present.   IMPRESSION: 1.  CT findings consistent with infection/inflammation and possible early abscess formation. He has been placed on broad-spectrum IV antibiotics.  2.  Apparent migration of the left ureteral stent. This apparently was not placed for obstruction. There is no indication for removal at this time.  3.  History of urinary retention status post transurethral resection of the prostate - no significant residual on CT scan.   RECOMMENDATION: 1.  Broad-spectrum antibiotic therapy. If he does not respond clinically, would recommend a followup CT in the next 24 to 48 hours. If he does improve clinically, would obtain a followup CT in 4 to 6 weeks.  2.  He will most likely require ureteroscopic removal of his left ureteral stent; however, this can be arranged when he follows up with Dr.  Houser.  3.  We will follow.  ____________________________ Kenneth Fairly Bernardo Heater, MD scs:dmm D: 04/07/2013 22:59:17 ET T: 04/08/2013 10:37:11 ET JOB#: [id number]  cc: Kenneth Reaper C. Bernardo Heater, MD, <Dictator> Abbie Sons MD ELECTRONICALLY SIGNED 04/29/2013 21:23

## 2014-05-30 NOTE — H&P (Signed)
PATIENT NAME:  Kenneth Summers, Kenneth Summers MR#:  [id number] DATE OF BIRTH:  [birth year]  DATE OF ADMISSION:  04/18/2013  REFERRING PHYSCIAN:  Dr. Ponciano Ort.  PRIMARY CARE PHYSICIAN:  Dr. Adrian Prows at [facility].   CHIEF COMPLAINT:  Weakness.   HISTORY OF PRESENT ILLNESS:  A 69 year old Caucasian gentleman with history of coronary artery disease, degenerative joint disease, lupus as well as recent discharge on 04/14/2013 for sepsis secondary to urinary source, renal abscess, who is presenting with weakness.  The patient is unable to provide much meaningful information.  States that he does not know why he is here.  Remainder of history obtained from family at bedside.  States that he was weak for 6 to 7 days prior to first admission.  Weakness however persisted.  This is now with difficulty with ambulation.  He had 4 falls since his discharge without trauma or loss of consciousness and decreased by mouth intake without associated nausea, vomiting, diarrhea, has however had multiple bouts of diarrhea described as loose watery stools without blood or mucus.  No fevers or chills, but generalized fatigue and weakness.  The patient himself complains only of weakness.  No further complaints.   REVIEW OF SYSTEMS:  CONSTITUTIONAL:  Denies fevers, chills.  Positive for weakness, fatigue.  EYES:  Denied blurred vision, double vision, eye pain.  EARS, NOSE, THROAT:  Denies tinnitus, ear pain, hearing loss.  RESPIRATORY:  Denies cough, wheeze, shortness of breath.  CARDIOVASCULAR:  Denies chest pain, palpitations, edema.  GASTROINTESTINAL:  Denies nausea, vomiting.  Positive for diarrhea as described above.  Denies any abdominal pain.  GENITOURINARY:  Denies dysuria, hematuria.  ENDOCRINE:  Denies nocturia or thyroid problems.   HEMATOLOGY AND LYMPHATIC:  Denies easy bruising or bleeding.  SKIN:  Denies rashes or lesions.  MUSCULOSKELETAL:  Denies pain in neck, back, shoulder, knees, hips or arthritic  symptoms.  NEUROLOGIC:  Denies paralysis, paresthesias.  PSYCHIATRIC:  Denies anxiety or depressive symptoms.  Otherwise, full review of systems performed by me is negative.   PAST MEDICAL HISTORY:  Coronary artery disease, degenerative joint disease, lupus, history of TIAs, osteomyelitis followed by Dr. Ola Spurr and recent discharge from [facility] on 04/14/2013 with discharge diagnosis of sepsis secondary to urinary source with renal abscess, discharged on Keflex.   SOCIAL HISTORY:  Remote tobacco usage.  Positive for occasional alcohol usage.  Denies drug usage.   FAMILY HISTORY:  Denies any known cardiovascular diseases.   ALLERGIES:  No known drug allergies.   HOME MEDICATIONS:  Include finasteride 5 mg by mouth daily, aspirin 81 mg by mouth daily, Os-Cal by mouth one three times every four hours as needed for pain, Flomax 0.4 mg by mouth daily, gabapentin 400 mg by mouth 3 times daily, fluoxetine 40 mg by mouth daily, nortriptyline 25 mg by mouth at bedtime, trazodone 50 mg by mouth at bedtime as needed for sleep, cholestyramine 4 grams one packet twice daily, Requip 0.5 mg 2 tablets at bedtime, atenolol 25 mg by mouth daily, Keflex 500 mg by mouth 4 times daily, magnesium oxide 250 mg by mouth daily, sucralfate 1 gram by mouth twice daily, ocular lubricant ointment to affected eye daily.   PHYSICAL EXAMINATION: VITAL SIGNS:  Temperature 97.6, heart rate 65, respirations 18, blood pressure 124/59, saturating 100% on room air.  Weight 96.2 kg, BMI 25.2.  GENERAL:  Chronically ill-appearing Caucasian gentleman, currently in no acute distress.  HEAD:  Normocephalic, atraumatic.  EYES:  Pupils equal, round, reactive to light.  Extraocular muscles intact.  No scleral icterus.  MOUTH:  Dry mucosal membrane.  Dentition intact.  No abscess noted. EARS, NOSE, THROAT:  Throat clear without exudates.  No external lesions.  NECK:  Supple.  No thyromegaly.  No nodules.  No JVD.  PULMONARY:   Clear to auscultation bilaterally without wheeze, rubs or rhonchi.  No use of accessory muscles.  Good respiratory effort.  CHEST:  Nontender to palpation.  CARDIOVASCULAR:  S1, S2, regular rate and rhythm.  No murmurs, rubs, or gallops, 2+ edema to shins bilaterally.  Pedal pulses 2+ bilaterally.  GASTROINTESTINAL:  Soft, nontender, nondistended.  No masses.  Positive bowel sounds.  No hepatosplenomegaly.  MUSCULOSKELETAL:  No swelling or clubbing.  Positive for edema as described above.  Range of motion limited in the lower extremities secondary to weakness as far as proximal flexion and extension.  NEUROLOGIC:  Cranial nerves II through XII intact.  No gross focal neurological deficits.  Sensation intact.  Reflexes intact.  SKIN:  No ulcerations, lesions, rashes, cyanosis.  Skin warm, dry.  Turgor intact. PSYCHIATRIC:  Mood and affect blunted.  He is awake, alert, oriented x 2 to person and place.  However he is on the date.  He did know the year, but not the month or day of the week.  Insight and judgment relatively intact.   LABORATORY DATA:  Sodium 136, potassium 4, chloride 104, bicarb 27, BUN 11, creatinine 1.24, glucose 72.  WBC 9.1, hemoglobin 10.3, platelets of 423.  Urinalysis, WBCs 65, RBCs 4, leukocyte esterase 3+, nitrite negative.  Chest x-ray performed revealing no acute cardiopulmonary process.  Ultrasound of the legs performed revealing no DVTs.   ASSESSMENT AND PLAN:  A 69 year old Caucasian gentleman with history of coronary artery disease recently discharged from [facility] on 04/14/2013 with sepsis from urinary source, renal abscess, presenting with generalized weakness.  1.  Generalized weakness, has urinary tract infection by labs.  He is currently on Keflex and we will change to IV ceftriaxone.  This covers essentially the same organisms.  He had Escherichia coli which is fairly sensitive on prior admission.  Provide IV fluid hydration with normal saline and physical  therapy evaluation.  2.  Diarrhea.  We will check Clostridium difficile.  3.  Hypertension.  Continue atenolol.  4.  Venous thromboembolism prophylaxis with heparin subQ.  5.  CODE STATUS:  THE PATIENT IS FULL CODE.   TIME SPENT:  45 minutes.     ____________________________ Aaron Mose. Hower, MD dkh:ea D: 04/18/2013 29:93:71 ET T: 04/19/2013 00:29:21 ET JOB#: [id number]  cc: Aaron Mose. Hower, MD, <Dictator> DAVID Woodfin Ganja MD ELECTRONICALLY SIGNED 04/20/2013 1:52

## 2014-05-30 NOTE — Consult Note (Signed)
69 yo male s/p TURP followed by Dr. Elnoria Howard on this admission.  Dr. Elnoria Howard removed his ureteral stent last week.  The patient is improving.  No fevers and normal WBC. Creatinine stable reports that he is voiding without any difficulty.  No hematuria further urologic care needed while inpatient.  Will need to f/u with outpatient urologist on discharge.    Electronic Signatures: Margy Clarks (MD)  (Signed on 08-Mar-15 12:02)  Authored  Last Updated: 08-Mar-15 12:02 by Margy Clarks (MD)

## 2014-05-30 NOTE — Op Note (Signed)
PATIENT NAME:  Kenneth Summers, Kenneth Summers MR#:  [id number] DATE OF BIRTH:  09/08/1945  DATE OF PROCEDURE:  04/09/2013  PREOPERATIVE  DIAGNOSIS:  Left renal abscess, pyelonephritis with stent.   POSTOPERATIVE DIAGNOSIS:  Left renal abscess, pyelonephritis with stent.   PROCEDURE: Cystoscopy, ureteroscopy and stent removal.   SURGEON: Ercel Normoyle D. Elnoria Howard, DO  COMPLICATIONS: None.   ESTIMATED BLOOD LOSS: Zero.   ANESTHESIA: General.   DETAILS OF PROCEDURE:  With the patient sterilely prepped and draped in supine lithotomy position for ease of approach to the external genitalia, the procedure was begun. The patient has good relaxation from a general anesthetic. I do a cystoscopy. The patient had a recent resection within the last 3 weeks of the prostate. The resection was carried very close to the distal end of the left ureter and a stent had been placed properly in the ureter but had retracted up into the ureter about 2 cm from the orifice. There was no string attached to the stent so I had to do a ureteroscopy and grasp the stent. To do the ureteroscopy, I place a Glidewire up the ureter, took a short ureteroscope, easily went into the ureter, which was very, very close to the edge of the resection of the prostate. It made, access to this ureter a little bit difficult, but I did not really have a great problem accessing it. It is just that the resection site was very close to the distal ureter. So I want up into the ureter. The ureter appeared to be intact. I removed the stent because I did not want it to contribute with reflux, pressure or infection in the area of the kidney. So the stent is removed and there is no obstruction to the kidney and there was no obstruction at the time of surgery. It is just placed in there to aid the surgeon in properly resecting the prostate. The prostate itself has been well resected. There is still some debris present which would be expected to 3 weeks.  This is from the resection  it is where the area of tissue has been cauterized and is sloughing off. There was no bleeding during the procedure. I had no problems at all. The patient has a very good prostate resection. He has quite a bit of trabeculation in the bladder and I could see that he certainly needed the TURP at the bladder appeared to be one that had been obstructed for some time. There was saccule formation but no diverticula. So the stent is removed. The bladder is emptied, and 30 mL of Marcaine placed in the bladder, B and O suppository and he is sent to recovery in satisfactory condition.    ____________________________ Janice Coffin. Elnoria Howard, [place] rdh:dp D: 04/09/2013 08:34:36 ET T: 04/09/2013 13:50:31 ET JOB#: [id number]  cc: Janice Coffin. Elnoria Howard, DO, <Dictator> Violet Seabury D Dontel Harshberger DO ELECTRONICALLY SIGNED 04/11/2013 14:30

## 2014-05-30 NOTE — Consult Note (Signed)
PATIENT NAME:  Kenneth Summers, MCCLENNY MR#:  [id number] DATE OF BIRTH:  1945-11-22  DATE OF CONSULTATION:  04/08/2013  REFERRING PHYSICIAN:  Austin Sink, MD CONSULTING PHYSICIAN:  Janice Coffin. Elnoria Howard, DO  This patient presented with apparent pyelonephritis, sepsis, and it looks like he has a small perinephric abscess. He recently 20 days ago had a prostate surgery for long-term outlet obstruction secondary to BPH. He is voiding well after this. At the time of his surgery, a stent was placed in the left ureter into the left kidney. The stent is in place in the left kidney, but it is not in the bladder at this point. Apparently, there is a nice string on this stent, so I am going to pull it into the bladder tomorrow. I reviewed the case with invasive radiology, Dr. Burt Knack, and we felt that we do not need to do a percutaneous nephrostomy at this time, as the abscess in the perinephric infection is not large enough to put a needle in, so at this present time, my recommendation is we give him something for his pain from his pyelonephritis and tomorrow do a procedure on him. He is going to be in the hospital at least a week with IV antibiotics.   PAST MEDICAL HISTORY: Includes chronic pain, degenerative joint disease, urinary tract infections, BPH, lupus, history of TIA. His medical problems, other than his pyelonephritis, are chronic diarrhea. He has confusion, chronic back pain.   PAST SURGICAL HISTORY: C fusion, lower back surgery, TURP, stent, toe fusion.   FAMILY HISTORY: Negative for cancer and MI.  SOCIAL HISTORY: Smoked previously, quit 30 years ago. EtOH is positive; every night he drinks brandy.   MEDICATIONS: Trazodone 50 mg once a day, Flomax 0.4 once a day which he does not need now, sucralfate 1 gram twice daily, ropinirole 0.5 mg 2 tablets each day, oxycodone 5 mg 1 to 3 every 4 hours for pain, nortriptyline 25 mg once a day, magnesium oxide 250 once a day, gabapentin 400 mg 3 times a  day, fluoxetine 40 mg once a day. He has been on finasteride which he does not need anymore and cholestyramine 4 g 2 times a day, atenolol 25 mg daily.   REVIEW OF SYSTEMS:   EYES: No blurry vision.  ENT: No difficulty swallowing.  RESPIRATORY: No coughing, wheezing, or COPD.  CARDIOVASCULAR: No chest pain. No history of MI,  coronary artery disease that he will admit to.  GASTROINTESTINAL: No nausea, vomiting, abdominal pain, but he has chronic diarrhea.   GENITOURINARY: No dysuria or hematuria. He has frequency and urgency since his surgery, but he does not have stress incontinence. He has severe pyelonephritis right now with a small renal abscess.  ENDOCRINE: No polyuria, polydipsia, polyphagia.  HEMATOLOGIC: No anemia, easy bruising.  SKIN: He has quite a ruddiness to his face, which could be secondary to his lupus.  NEUROLOGIC: No numbness, tingling, CVAs. negative history of TIAs.  PSYCHIATRIC: No depression.    PHYSICAL EXAMINATION: GENERAL: Well-developed, well-nourished, a little confused and slight slurring of his voice.  VITAL SIGNS: Stable.  HEENT: Pupils are equal and reactive to light. Extraocular motor movements are normal. Face is ruddy.  NECK: Easily movable.  HEART SOUNDS: Regular in rate and rhythm. No murmurs, rubs, or clicks.  LUNGS: Clear to auscultation.  ABDOMEN: Soft, left positive Lloyd's (very, very positive) and central abdomen normal.  EXTREMITIES: No cyanosis or clubbing,  SKIN: No rashes or petechiae. No jaundice. PSYCHIATRIC: No agitation, but  he is a little lethargic and his voice is a little slurred, but otherwise he knows where he is and what he is doing, and he knows his family. MUSCULOSKELETAL: Joints at the present time: No joint effusion.   LABORATORY DATA: Glucose 117, creatinine 1.46; sodium and potassium are normal. Urinalysis: 12,000 white cells at the present time. His white count is 22,000, hemoglobin 10.7, hematocrit 34.   IMPRESSION: As  above. Will take care of in the morning. Hopefully he will be more alert and oriented.   ____________________________ Janice Coffin. Elnoria Howard, DO rdh:jcm D: 04/08/2013 16:52:17 ET T: 04/08/2013 17:26:15 ET JOB#: [id number]  cc: Janice Coffin. Elnoria Howard, DO, <Dictator> RICHARD D HART DO ELECTRONICALLY SIGNED 04/11/2013 14:29

## 2014-05-30 NOTE — Discharge Summary (Signed)
PATIENT NAME:  Kenneth Summers, Kenneth Summers MR#:  [id number] DATE OF BIRTH:  03/10/1945  DATE OF ADMISSION:  04/19/2013 DATE OF DISCHARGE:  04/23/2013  DISCHARGE DIAGNOSES: 1.  Clostridium difficile colitis.  2.  Dehydration.  3.  Recent urinary tract infection.  4.  Lupus.    HISTORY OF PRESENT ILLNESS: Please see initial history and physical for details. Briefly, this is a 69 year old gentleman with history of lupus, as well as a recent admission for left-sided renal abscess. During that admission, he was treated with antibiotics and then discharged on oral Keflex. He had removal of a stent by urology. His E. coli was sensitive at that time. The patient was discharged home and initially improved but then worsened. He was readmitted March  with symptoms of mainly confusion and diarrhea.   HOSPITAL COURSE BY ISSUE:  1.  Diarrhea. The patient was found to have C. difficile. He was initially treated with Flagyl, but the diarrhea persisted so he was changed to oral vancomycin. He will continue a 14-day course of that.  2.  Dehydration. Initially the patient's labs were consistent with dehydration. He was treated with IV fluids. He then had improvement in his p.o. intake.  3.  Urinary tract infection. The patient did have a urinalysis done on admission that showed 65 white cells. He was treated with ceftriaxone; however, this was discontinued after 4 days given the ongoing C. difficile. Followup urinalysis done on the day of discharge showed only 25 white cells. Urine culture is pending, as one was not done on the 13th. He will need followup with his urologist at [facility], who had recently done prostate surgery.   DISCHARGE MEDICATIONS: Please see [facility] physician discharge summary. Briefly, the patient will be on vancomycin orally for the next 10 days for initial treatment course of the C. difficile. We have held his other antibiotics. I have also restarted his prednisone 5 mg, which he says he is on as an outpatient  for his lupus. He will certainly need to follow up with his rheumatologist at [facility].   DISCHARGE DIET: Low-sodium, regular consistency.   DISCHARGE DISPOSITION: The patient will be discharged to skilled nursing facility.   DISCHARGE FOLLOWUP: The patient will follow up with Dr. Ola Spurr, with his primary care doctor, and with his rheumatologist in the next 1 to 2 weeks.  This discharge took 40 minutes.   ____________________________ Cheral Marker. Ola Spurr, MD dpf:jcm D: 04/23/2013 14:36:51 ET T: 04/23/2013 15:36:59 ET JOB#: [id number]  cc: Cheral Marker. Ola Spurr, MD, <Dictator> Calvin Chura Ola Spurr MD ELECTRONICALLY SIGNED 05/10/2013 13:25

## 2015-02-06 DIAGNOSIS — R5383 Other fatigue: Secondary | ICD-10-CM | POA: Insufficient documentation

## 2015-05-19 DIAGNOSIS — H35383 Toxic maculopathy, bilateral: Secondary | ICD-10-CM | POA: Insufficient documentation

## 2016-03-04 ENCOUNTER — Encounter: Payer: Self-pay | Admitting: Gynecology

## 2016-03-04 ENCOUNTER — Ambulatory Visit
Admission: EM | Admit: 2016-03-04 | Discharge: 2016-03-04 | Disposition: A | Discharge status: Home / Self Care | Payer: Medicare Other | Attending: Family Medicine | Admitting: Family Medicine

## 2016-03-04 DIAGNOSIS — R070 Pain in throat: Secondary | ICD-10-CM

## 2016-03-04 DIAGNOSIS — R509 Fever, unspecified: Secondary | ICD-10-CM | POA: Diagnosis not present

## 2016-03-04 DIAGNOSIS — R5383 Other fatigue: Secondary | ICD-10-CM

## 2016-03-04 DIAGNOSIS — R0981 Nasal congestion: Secondary | ICD-10-CM | POA: Diagnosis not present

## 2016-03-04 DIAGNOSIS — M791 Myalgia: Secondary | ICD-10-CM

## 2016-03-04 DIAGNOSIS — R05 Cough: Secondary | ICD-10-CM

## 2016-03-04 DIAGNOSIS — J111 Influenza due to unidentified influenza virus with other respiratory manifestations: Secondary | ICD-10-CM

## 2016-03-04 DIAGNOSIS — R69 Illness, unspecified: Principal | ICD-10-CM

## 2016-03-04 HISTORY — DX: Peripheral vascular disease, unspecified: I73.9

## 2016-03-04 HISTORY — DX: Selective deficiency of immunoglobulin a (iga): D80.2

## 2016-03-04 HISTORY — DX: Alcohol abuse, uncomplicated: F10.10

## 2016-03-04 HISTORY — DX: Pruritus, unspecified: L29.9

## 2016-03-04 HISTORY — DX: Essential (primary) hypertension: I10

## 2016-03-04 HISTORY — DX: Benign prostatic hyperplasia without lower urinary tract symptoms: N40.0

## 2016-03-04 HISTORY — DX: Irritable bowel syndrome, unspecified: K58.9

## 2016-03-04 HISTORY — DX: Radiculopathy, lumbosacral region: M54.17

## 2016-03-04 HISTORY — DX: Obstructive sleep apnea (adult) (pediatric): G47.33

## 2016-03-04 HISTORY — DX: Radiculopathy, site unspecified: M54.10

## 2016-03-04 HISTORY — DX: Other chronic osteomyelitis, unspecified site: M86.60

## 2016-03-04 HISTORY — DX: Systemic lupus erythematosus, unspecified: M32.9

## 2016-03-04 HISTORY — DX: Chronic kidney disease, unspecified: N18.9

## 2016-03-04 HISTORY — DX: Insomnia, unspecified: G47.00

## 2016-03-04 HISTORY — DX: Rheumatoid arthritis, unspecified: M06.9

## 2016-03-04 HISTORY — DX: Cellulitis, unspecified: L03.90

## 2016-03-04 HISTORY — DX: Osteomyelitis, unspecified: M86.9

## 2016-03-04 HISTORY — DX: Diplopia: H53.2

## 2016-03-04 HISTORY — DX: Restless legs syndrome: G25.81

## 2016-03-04 HISTORY — DX: Gastro-esophageal reflux disease without esophagitis: K21.9

## 2016-03-04 HISTORY — DX: Depression, unspecified: F32.A

## 2016-03-04 HISTORY — DX: Enterocolitis due to Clostridium difficile, not specified as recurrent: A04.72

## 2016-03-04 HISTORY — DX: Cerebral infarction, unspecified: I63.9

## 2016-03-04 HISTORY — DX: Other fatigue: R53.83

## 2016-03-04 HISTORY — DX: Spinal stenosis, cervical region: M48.02

## 2016-03-04 HISTORY — DX: Antiphospholipid syndrome: D68.61

## 2016-03-04 HISTORY — DX: Major depressive disorder, single episode, unspecified: F32.9

## 2016-03-04 HISTORY — DX: Hyperlipidemia, unspecified: E78.5

## 2016-03-04 HISTORY — DX: Vitamin D deficiency, unspecified: E55.9

## 2016-03-04 HISTORY — DX: Atherosclerotic heart disease of native coronary artery without angina pectoris: I25.10

## 2016-03-04 HISTORY — DX: Toxic maculopathy, bilateral: H35.383

## 2016-03-04 MED ORDER — OSELTAMIVIR PHOSPHATE 75 MG PO CAPS: 75.0000 mg | ORAL_CAPSULE | Freq: Two times a day (BID) | ORAL | 0 refills | Status: DC

## 2016-03-04 MED ORDER — HYDROCOD POLST-CPM POLST ER 10-8 MG/5ML PO SUER: 5.0000 mL | Freq: Every evening | ORAL | 0 refills | Status: DC | PRN

## 2016-03-04 NOTE — ED Triage Notes (Signed)
Per patient wife diagnose with the flu. Pt. C/o coughing/ nasal drip / body ache.

## 2016-03-04 NOTE — ED Provider Notes (Signed)
[facility]    CSN: [id number] Arrival date & time: 03/04/16  1351     History   Chief Complaint Chief Complaint  Patient presents with  . Influenza    HPI Kenneth Summers is a 71 y.o. male.   The history is provided by the patient.  URI  Presenting symptoms: congestion, cough, fatigue, fever and sore throat   Severity:  Moderate Onset quality:  Sudden Duration:  2 days Timing:  Constant Progression:  Worsening Chronicity:  New Relieved by:  None tried Associated symptoms: myalgias   Associated symptoms: no sinus pain and no wheezing   Risk factors: being elderly, chronic cardiac disease, chronic kidney disease, recent travel and sick contacts (wife hospitalized with the flu)   Risk factors: no chronic respiratory disease, no diabetes mellitus, no immunosuppression and no recent illness     No past medical history on file.  There are no active problems to display for this patient.   No past surgical history on file.     Home Medications    Prior to Admission medications   Medication Sig Start Date End Date Taking? Authorizing Provider  acetaminophen (TYLENOL) 500 MG tablet Take 500 mg by mouth every 6 (six) hours as needed.   Yes Historical Provider, MD  aspirin EC 81 MG tablet Take by mouth daily.   Yes Historical Provider, MD  atenolol (TENORMIN) 25 MG tablet Take by mouth daily.   Yes Historical Provider, MD  atorvastatin (LIPITOR) 80 MG tablet Take 80 mg by mouth daily.   Yes Historical Provider, MD  clopidogrel (PLAVIX) 75 MG tablet Take 75 mg by mouth daily.   Yes Historical Provider, MD  FLUoxetine (PROZAC) 40 MG capsule Take 40 mg by mouth daily.   Yes Historical Provider, MD  gabapentin (NEURONTIN) 100 MG capsule Take 100 mg by mouth 3 (three) times daily.   Yes Historical Provider, MD  gabapentin (NEURONTIN) 300 MG capsule Take 300 mg by mouth 3 (three) times daily.   Yes Historical Provider, MD  hydroxychloroquine (PLAQUENIL) 200 MG  tablet Take by mouth daily.   Yes Historical Provider, MD  losartan (COZAAR) 25 MG tablet Take 25 mg by mouth daily.   Yes Historical Provider, MD  Melatonin 10 MG TABS Take by mouth.   Yes Historical Provider, MD  predniSONE (DELTASONE) 5 MG tablet Take 5 mg by mouth daily with breakfast.   Yes Historical Provider, MD  rOPINIRole (REQUIP) 0.5 MG tablet Take 0.5 mg by mouth 3 (three) times daily.   Yes Historical Provider, MD  chlorpheniramine-HYDROcodone (TUSSIONEX PENNKINETIC ER) 10-8 MG/5ML SUER Take 5 mLs by mouth at bedtime as needed for cough. 03/04/16   Norval Gable, MD  oseltamivir (TAMIFLU) 75 MG capsule Take 1 capsule (75 mg total) by mouth 2 (two) times daily. 03/04/16   Norval Gable, MD    Family History No family history on file.  Social History Social History  Substance Use Topics  . Smoking status: Former Research scientist (life sciences)  . Smokeless tobacco: Never Used  . Alcohol use No     Allergies   Patient has no known allergies.   Review of Systems Review of Systems  Constitutional: Positive for fatigue and fever.  HENT: Positive for congestion and sore throat. Negative for sinus pain.   Respiratory: Positive for cough. Negative for wheezing.   Musculoskeletal: Positive for myalgias.     Physical Exam Triage Vital Signs ED Triage Vitals  Enc Vitals Group     BP 03/04/16  1411 (!) 184/74     Pulse Rate 03/04/16 1411 67     Resp 03/04/16 1411 16     Temp 03/04/16 1411 98.6 F (37 C)     Temp Source 03/04/16 1411 Oral     SpO2 03/04/16 1411 100 %     Weight 03/04/16 1411 225 lb (102.1 kg)     Height 03/04/16 1411 6\' 5"  (1.956 m)     Head Circumference --      Peak Flow --      Pain Score 03/04/16 1418 3     Pain Loc --      Pain Edu? --      Excl. in [place]? --    No data found.   Updated Vital Signs BP (!) 184/74 (BP Location: Left Arm)   Pulse 67   Temp 98.6 F (37 C) (Oral)   Resp 16   Ht 6\' 5"  (1.956 m)   Wt 225 lb (102.1 kg)   SpO2 100%   BMI 26.68 kg/m    Visual Acuity Right Eye Distance:   Left Eye Distance:   Bilateral Distance:    Right Eye Near:   Left Eye Near:    Bilateral Near:     Physical Exam  Constitutional: He appears well-developed and well-nourished. No distress.  HENT:  Head: Normocephalic and atraumatic.  Right Ear: Tympanic membrane, external ear and ear canal normal.  Left Ear: Tympanic membrane, external ear and ear canal normal.  Nose: Nose normal.  Mouth/Throat: Uvula is midline, oropharynx is clear and moist and mucous membranes are normal. No oropharyngeal exudate or tonsillar abscesses.  Eyes: Conjunctivae and EOM are normal. Pupils are equal, round, and reactive to light. Right eye exhibits no discharge. Left eye exhibits no discharge. No scleral icterus.  Neck: Normal range of motion. Neck supple. No tracheal deviation present. No thyromegaly present.  Cardiovascular: Normal rate, regular rhythm and normal heart sounds.   Pulmonary/Chest: Effort normal and breath sounds normal. No stridor. No respiratory distress. He has no wheezes. He has no rales. He exhibits no tenderness.  Lymphadenopathy:    He has no cervical adenopathy.  Neurological: He is alert.  Skin: Skin is warm and dry. No rash noted. He is not diaphoretic.  Nursing note and vitals reviewed.    UC Treatments / Results  Labs (all labs ordered are listed, but only abnormal results are displayed) Labs Reviewed - No data to display  EKG  EKG Interpretation None       Radiology No results found.  Procedures Procedures (including critical care time)  Medications Ordered in UC Medications - No data to display   Initial Impression / Assessment and Plan / UC Course  I have reviewed the triage vital signs and the nursing notes.  Pertinent labs & imaging results that were available during my care of the patient were reviewed by me and considered in my medical decision making (see chart for details).       Final Clinical  Impressions(s) / UC Diagnoses   Final diagnoses:  Influenza-like illness    New Prescriptions New Prescriptions   CHLORPHENIRAMINE-HYDROCODONE (TUSSIONEX PENNKINETIC ER) 10-8 MG/5ML SUER    Take 5 mLs by mouth at bedtime as needed for cough.   OSELTAMIVIR (TAMIFLU) 75 MG CAPSULE    Take 1 capsule (75 mg total) by mouth 2 (two) times daily.   1. diagnosis reviewed with patient 2. rx as per orders above; reviewed possible side effects, interactions, risks and benefits  3. Recommend supportive treatment with rest, fluids 4. Follow-up prn if symptoms worsen or don't improve   Norval Gable, MD 03/04/16 1459

## 2016-04-27 DIAGNOSIS — M05772 Rheumatoid arthritis with rheumatoid factor of left ankle and foot without organ or systems involvement: Secondary | ICD-10-CM | POA: Insufficient documentation

## 2016-05-08 ENCOUNTER — Ambulatory Visit
Admission: EM | Admit: 2016-05-08 | Discharge: 2016-05-08 | Disposition: A | Discharge status: Home / Self Care | Payer: Medicare Other | Attending: Family Medicine | Admitting: Family Medicine

## 2016-05-08 ENCOUNTER — Encounter: Payer: Self-pay | Admitting: *Deleted

## 2016-05-08 DIAGNOSIS — S91312A Laceration without foreign body, left foot, initial encounter: Secondary | ICD-10-CM | POA: Diagnosis not present

## 2016-05-08 DIAGNOSIS — W272XXA Contact with scissors, initial encounter: Secondary | ICD-10-CM | POA: Diagnosis not present

## 2016-05-08 MED ORDER — MUPIROCIN 2 % EX OINT: 1.0000 "application " | TOPICAL_OINTMENT | Freq: Three times a day (TID) | CUTANEOUS | 0 refills | Status: DC

## 2016-05-08 MED ORDER — SULFAMETHOXAZOLE-TRIMETHOPRIM 800-160 MG PO TABS: 1.0000 | ORAL_TABLET | Freq: Two times a day (BID) | ORAL | 0 refills | Status: DC

## 2016-05-08 NOTE — ED Provider Notes (Signed)
CSN: [id number]     Arrival date & time 05/08/16  1156 History   None    Chief Complaint  Patient presents with  . Extremity Laceration   (Consider location/radiation/quality/duration/timing/severity/associated sxs/prior Treatment) HPI  Summary 71 year old male who presents with a laceration on the dorsum of left foot. He states that he has a nonhealing wound on the plantar surface that he has been changing dressings on daily. He is going to have the surgery on the foot soon. He was removing bandages when his scissors lacerated the dorsum of his foot. since he has  limited sensation of his foot, he did not immediately realize that he had lacerated his foot. He only became aware when he felt the blood dripping. Laceration is approximately 7 cm in length extending into the subcutaneous tissue approximately 2-3 mm. Patient does have  lupus disease     Past Medical History:  Diagnosis Date  . Alcohol abuse   . APS (antiphospholipid syndrome) ([place])   . BPH (benign prostatic hyperplasia)   . CAD (coronary artery disease)   . Cellulitis   . Cervical spinal stenosis   . Chronic osteomyelitis ([place])   . CKD (chronic kidney disease)   . CKD (chronic kidney disease)   . Clostridium difficile diarrhea   . Depression   . Diplopia   . Fatigue   . GERD (gastroesophageal reflux disease)   . Hyperlipemia   . Hypertension   . Hypovitaminosis D   . IBS (irritable bowel syndrome)   . 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 Insomnia   . Left lumbosacral radiculopathy   . Moderate obstructive sleep apnea   . Osteomyelitis of foot ([place])   . PAD (peripheral artery disease) ([place])   . Pruritus   . RA (rheumatoid arthritis) ([place])   . Radiculopathy   . Restless legs syndrome   . RLS (restless legs syndrome)   . SLE (systemic lupus erythematosus related syndrome) ([place])   . Stroke ([place])   . Toxic maculopathy of both eyes    Past Surgical History:  Procedure Laterality Date  . CARDIAC CATHETERIZATION    .  CAROTID ENDARTERECTOMY    . CERVICAL LAMINECTOMY    . CORONARY ANGIOPLASTY    . FRACTURE SURGERY    . fusion C5-6-7    . HERNIA REPAIR    . KNEE ARTHROSCOPY    . TONSILLECTOMY     History reviewed. No pertinent family history. Social History  Substance Use Topics  . Smoking status: Former Research scientist (life sciences)  . Smokeless tobacco: Never Used  . Alcohol use No    Review of Systems  Constitutional: Positive for activity change. Negative for chills, fatigue and fever.  Musculoskeletal: Positive for gait problem.  Skin: Positive for wound.  All other systems reviewed and are negative.   Allergies  Patient has no known allergies.  Home Medications   Prior to Admission medications   Medication Sig Start Date End Date Taking? Authorizing Provider  aspirin EC 81 MG tablet Take by mouth daily.   Yes Historical Provider, MD  atenolol (TENORMIN) 25 MG tablet Take by mouth daily.   Yes Historical Provider, MD  atorvastatin (LIPITOR) 80 MG tablet Take 80 mg by mouth daily.   Yes Historical Provider, MD  clopidogrel (PLAVIX) 75 MG tablet Take 75 mg by mouth daily.   Yes Historical Provider, MD  FLUoxetine (PROZAC) 40 MG capsule Take 40 mg by mouth daily.   Yes Historical Provider, MD  gabapentin (NEURONTIN) 100 MG capsule Take  100 mg by mouth 3 (three) times daily.   Yes Historical Provider, MD  hydroxychloroquine (PLAQUENIL) 200 MG tablet Take by mouth daily.   Yes Historical Provider, MD  losartan (COZAAR) 25 MG tablet Take 25 mg by mouth daily.   Yes Historical Provider, MD  Melatonin 10 MG TABS Take by mouth.   Yes Historical Provider, MD  predniSONE (DELTASONE) 5 MG tablet Take 5 mg by mouth daily with breakfast.   Yes Historical Provider, MD  rOPINIRole (REQUIP) 0.5 MG tablet Take 0.5 mg by mouth 3 (three) times daily.   Yes Historical Provider, MD  acetaminophen (TYLENOL) 500 MG tablet Take 500 mg by mouth every 6 (six) hours as needed.    Historical Provider, MD  chlorpheniramine-HYDROcodone  (TUSSIONEX PENNKINETIC ER) 10-8 MG/5ML SUER Take 5 mLs by mouth at bedtime as needed for cough. 03/04/16   Norval Gable, MD  gabapentin (NEURONTIN) 300 MG capsule Take 300 mg by mouth 3 (three) times daily.    Historical Provider, MD  mupirocin ointment (BACTROBAN) 2 % Apply 1 application topically 3 (three) times daily. 05/08/16   Lorin Picket, PA-C  oseltamivir (TAMIFLU) 75 MG capsule Take 1 capsule (75 mg total) by mouth 2 (two) times daily. 03/04/16   Norval Gable, MD  sulfamethoxazole-trimethoprim (BACTRIM DS,SEPTRA DS) 800-160 MG tablet Take 1 tablet by mouth 2 (two) times daily. 05/08/16   Lorin Picket, PA-C   Meds Ordered and Administered this Visit  Medications - No data to display  BP 129/66 (BP Location: Left Arm)   Pulse (!) 55   Temp 98.3 F (36.8 C) (Oral)   Resp 16   Ht 6\' 5"  (1.956 m)   Wt 238 lb (108 kg)   SpO2 97%   BMI 28.22 kg/m  No data found.   Physical Exam  Constitutional: He is oriented to person, place, and time. He appears well-developed and well-nourished. No distress.  HENT:  Head: Normocephalic and atraumatic.  Eyes: Pupils are equal, round, and reactive to light.  Neck: Normal range of motion.  Musculoskeletal: Normal range of motion.  Neurological: He is alert and oriented to person, place, and time.  Skin: Skin is warm and dry. He is not diaphoretic.  Patient has an laceration over the dorsal of his left foot. It is a curvilinear it is approximately 7 cm in length and approximately 2-3 mm in depth. Incision over the dorsum of his foot is decreased due to his peripheral neuropathy and lupus disease.  Psychiatric: He has a normal mood and affect. His behavior is normal. Judgment and thought content normal.  Nursing note and vitals reviewed.   Urgent Care Course     .Marland KitchenLaceration Repair Date/Time: 05/08/2016 3:16 PM Performed by: Lorin Picket Authorized by: Frederich Cha   Consent:    Consent obtained:  Verbal   Consent given by:   Patient   Risks discussed:  Infection and poor wound healing   Alternatives discussed:  Referral Anesthesia (see MAR for exact dosages):    Anesthesia method:  Local infiltration   Local anesthetic:  Lidocaine 1% w/o epi Laceration details:    Location:  Foot   Foot location:  Top of L foot   Length (cm):  7   Depth (mm):  3 Repair type:    Repair type:  Intermediate Pre-procedure details:    Preparation:  Patient was prepped and draped in usual sterile fashion Exploration:    Hemostasis achieved with:  Direct pressure   Wound exploration:  entire depth of wound probed and visualized     Contaminated: no   Treatment:    Area cleansed with:  Saline and Betadine   Amount of cleaning:  Extensive   Irrigation solution:  Sterile water   Irrigation volume:  60   Irrigation method:  Pressure wash   Visualized foreign bodies/material removed: no   Skin repair:    Repair method:  Sutures   Suture size:  4-0   Suture material:  Nylon   Suture technique:  Running   Number of sutures:  7 Approximation:    Approximation:  Close   Vermilion border: well-aligned   Post-procedure details:    Dressing:  Sterile dressing   Patient tolerance of procedure:  Tolerated well, no immediate complications Comments:     Recommend wound check in 2 days followed by suture removal in 10 days. Apply Bactroban beginning in 24 hours used 3 times daily until suture removal.   (including critical care time)  Labs Review Labs Reviewed - No data to display  Imaging Review No results found.   Visual Acuity Review  Right Eye Distance:   Left Eye Distance:   Bilateral Distance:    Right Eye Near:   Left Eye Near:    Bilateral Near:     Medications - No data to display T dap was given  MDM   1. Laceration of left foot, initial encounter    Discharge Medication List as of 05/08/2016  3:02 PM    START taking these medications   Details  mupirocin ointment (BACTROBAN) 2 % Apply 1 application  topically 3 (three) times daily., Starting Mon 05/08/2016, Normal    sulfamethoxazole-trimethoprim (BACTRIM DS,SEPTRA DS) 800-160 MG tablet Take 1 tablet by mouth 2 (two) times daily., Starting Mon 05/08/2016, Normal      Plan: 1. Test/x-ray results and diagnosis reviewed with patient 2. rx as per orders; risks, benefits, potential side effects reviewed with patient 3. Recommend supportive treatment withElevation tonight and keep the dressing on for 24 hours progressing at that time and start applying Bactroban ointment 3 times daily. Recheck wound in 2 days and plan on suture removal in 10 days. Signs and symptoms of infection were reviewed with the patient and his caretaker 4. F/u prn if symptoms worsen or don't improve     Lorin Picket, PA-C 05/08/16 1524

## 2016-05-08 NOTE — ED Triage Notes (Signed)
Patient lacerated he left foot with scissors while removing a bandage today.

## 2016-05-19 ENCOUNTER — Ambulatory Visit
Admission: EM | Admit: 2016-05-19 | Discharge: 2016-05-19 | Disposition: A | Discharge status: Home / Self Care | Payer: Medicare Other

## 2016-05-19 DIAGNOSIS — Z4802 Encounter for removal of sutures: Secondary | ICD-10-CM

## 2016-05-19 NOTE — ED Notes (Signed)
Laceration is healing well, well approximated, clean, dry and intact.

## 2016-05-19 NOTE — ED Triage Notes (Signed)
Suture Removal 7 stitches on top of foot

## 2017-06-12 DIAGNOSIS — K529 Noninfective gastroenteritis and colitis, unspecified: Secondary | ICD-10-CM | POA: Insufficient documentation

## 2017-07-04 DIAGNOSIS — E785 Hyperlipidemia, unspecified: Secondary | ICD-10-CM | POA: Insufficient documentation

## 2017-10-24 DIAGNOSIS — I63531 Cerebral infarction due to unspecified occlusion or stenosis of right posterior cerebral artery: Secondary | ICD-10-CM | POA: Insufficient documentation

## 2018-04-11 DIAGNOSIS — Z8673 Personal history of transient ischemic attack (TIA), and cerebral infarction without residual deficits: Secondary | ICD-10-CM | POA: Insufficient documentation

## 2018-04-29 ENCOUNTER — Ambulatory Visit: Payer: Medicare Other | Admitting: Physical Therapy

## 2018-05-01 DIAGNOSIS — Z7901 Long term (current) use of anticoagulants: Secondary | ICD-10-CM | POA: Insufficient documentation

## 2018-05-02 ENCOUNTER — Ambulatory Visit: Payer: Medicare Other | Admitting: Occupational Therapy

## 2018-05-02 ENCOUNTER — Ambulatory Visit: Payer: Medicare Other | Admitting: Speech Pathology

## 2018-05-06 ENCOUNTER — Ambulatory Visit: Payer: Medicare Other | Admitting: Physical Therapy

## 2018-05-06 ENCOUNTER — Encounter: Payer: Medicare Other | Admitting: Occupational Therapy

## 2018-05-08 ENCOUNTER — Ambulatory Visit: Payer: Medicare Other | Admitting: Physical Therapy

## 2018-05-08 ENCOUNTER — Encounter: Payer: Medicare Other | Admitting: Occupational Therapy

## 2018-05-13 ENCOUNTER — Ambulatory Visit: Payer: Medicare Other | Admitting: Physical Therapy

## 2018-05-15 ENCOUNTER — Encounter: Payer: Medicare Other | Admitting: Occupational Therapy

## 2018-05-15 ENCOUNTER — Ambulatory Visit: Payer: Medicare Other | Admitting: Physical Therapy

## 2018-05-21 ENCOUNTER — Encounter: Payer: Medicare Other | Admitting: Occupational Therapy

## 2018-05-21 ENCOUNTER — Encounter: Payer: Medicare Other | Admitting: Speech Pathology

## 2018-05-23 ENCOUNTER — Encounter: Payer: Medicare Other | Admitting: Occupational Therapy

## 2018-05-23 ENCOUNTER — Encounter: Payer: Medicare Other | Admitting: Speech Pathology

## 2018-05-27 ENCOUNTER — Encounter: Payer: Medicare Other | Admitting: Occupational Therapy

## 2018-05-27 ENCOUNTER — Ambulatory Visit: Payer: Medicare Other | Admitting: Physical Therapy

## 2018-05-29 ENCOUNTER — Encounter: Payer: Medicare Other | Admitting: Occupational Therapy

## 2018-05-29 ENCOUNTER — Ambulatory Visit: Payer: Medicare Other | Admitting: Physical Therapy

## 2018-06-03 ENCOUNTER — Ambulatory Visit: Payer: Medicare Other | Admitting: Physical Therapy

## 2018-06-03 ENCOUNTER — Encounter: Payer: Medicare Other | Admitting: Occupational Therapy

## 2018-06-05 ENCOUNTER — Encounter: Payer: Medicare Other | Admitting: Occupational Therapy

## 2018-06-05 ENCOUNTER — Ambulatory Visit: Payer: Medicare Other | Admitting: Physical Therapy

## 2018-06-12 ENCOUNTER — Ambulatory Visit: Payer: Medicare Other | Attending: Family Medicine | Admitting: Physical Therapy

## 2018-06-12 ENCOUNTER — Encounter: Payer: Self-pay | Admitting: Physical Therapy

## 2018-06-12 ENCOUNTER — Ambulatory Visit: Payer: Medicare Other | Admitting: Occupational Therapy

## 2018-06-12 ENCOUNTER — Other Ambulatory Visit: Payer: Self-pay

## 2018-06-12 ENCOUNTER — Encounter: Payer: Self-pay | Admitting: Occupational Therapy

## 2018-06-12 DIAGNOSIS — R262 Difficulty in walking, not elsewhere classified: Secondary | ICD-10-CM | POA: Diagnosis present

## 2018-06-12 DIAGNOSIS — M6281 Muscle weakness (generalized): Secondary | ICD-10-CM

## 2018-06-12 DIAGNOSIS — I69354 Hemiplegia and hemiparesis following cerebral infarction affecting left non-dominant side: Secondary | ICD-10-CM | POA: Insufficient documentation

## 2018-06-12 DIAGNOSIS — R633 Feeding difficulties, unspecified: Secondary | ICD-10-CM

## 2018-06-12 DIAGNOSIS — R278 Other lack of coordination: Secondary | ICD-10-CM | POA: Insufficient documentation

## 2018-06-12 DIAGNOSIS — R296 Repeated falls: Secondary | ICD-10-CM | POA: Diagnosis present

## 2018-06-12 NOTE — Therapy (Signed)
[facility] [address], [place], [address] Phone: [phone]   Fax:  [phone]  Physical Therapy Evaluation  Patient Details  Name: Kenneth Summers MRN: [id number] Date of Birth: 08/03/1945 Referring Provider (PT): Dr. [name], Guy Begin   Encounter Date: 06/12/2018  PT End of Session - 06/12/18 1911    Visit Number  1    Number of Visits  24    Date for PT Re-Evaluation  09/04/18    Authorization Type  Medicare reporting period from 06/12/2018    Authorization Time Period  Current cert period 04/13/8586 - 09/04/2018    Authorization - Visit Number  1    Authorization - Number of Visits  10    PT Start Time  1330    PT Stop Time  1420    PT Time Calculation (min)  50 min    Equipment Utilized During Treatment  Gait belt    Activity Tolerance  Patient tolerated treatment well;Patient limited by fatigue    Behavior During Therapy  [facility] for tasks assessed/performed       Past Medical History:  Diagnosis Date  . Alcohol abuse   . APS (antiphospholipid syndrome) ([place])   . BPH (benign prostatic hyperplasia)   . CAD (coronary artery disease)   . Cellulitis   . Cervical spinal stenosis   . Chronic osteomyelitis ([place])   . CKD (chronic kidney disease)   . CKD (chronic kidney disease)   . Clostridium difficile diarrhea   . Depression   . Diplopia   . Fatigue   . GERD (gastroesophageal reflux disease)   . Hyperlipemia   . Hypertension   . Hypovitaminosis D   . IBS (irritable bowel syndrome)   . 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 Insomnia   . Left lumbosacral radiculopathy   . Moderate obstructive sleep apnea   . Osteomyelitis of foot ([place])   . PAD (peripheral artery disease) ([place])   . Pruritus   . RA (rheumatoid arthritis) ([place])   . Radiculopathy   . Restless legs syndrome   . RLS (restless legs syndrome)   . SLE (systemic lupus erythematosus related syndrome) ([place])   . Stroke ([place])   . Toxic  maculopathy of both eyes     Past Surgical History:  Procedure Laterality Date  . CARDIAC CATHETERIZATION    . CAROTID ENDARTERECTOMY    . CERVICAL LAMINECTOMY    . CORONARY ANGIOPLASTY    . FRACTURE SURGERY    . fusion C5-6-7    . HERNIA REPAIR    . KNEE ARTHROSCOPY    . TONSILLECTOMY      There were no vitals filed for this visit.   Subjective Assessment - 06/12/18 1908    Subjective  Patient reports condition started 10/30/2017. He had just been discharged from the hospital (he no longer remembers why he was hospitalized at that time). The day after he was discharged home, he was talking to someone who was staying with him and suddenly forgot everything he said. He was hospitalized for a stroke at that time at [facility]. He discharged to a SNF [facility]) where he stayed for 3 months. He received physical therapy there and according to his daughter, Leverne Humbles he was able to stand up to 50 seconds at the sink and took a few steps. He used a Warehouse manager to transfer while at the SNF but was advised against it at home for safety, so he  has been transferring with a hoyer since he has been home and feels he has lost some strength. He responds to cuing to pull knee up in bed. He is unable to transfer to a care and had to use public transportation Lucianne Lei to get to his appointment today. He uses a manual w/c for mobility with assistance and is in the process of getting a power chair. He is currently oriented to person, place, and partially oriented to situation. He recognizes he has memory deficits. Daughter provided some info as well. She says a previous PT thought something like an [place] stand may be helpful for transfers and beneficial for patient. Daughter lives in [place].     Patient is accompained by:  Family member   intermittantly; she thought she could not come in   Pertinent History  Patient is a 73 y.o. male who presents to outpatient physical therapy with a referral for medical  diagnosis of CVA. This patient's chief complaints consist of left hemiplegia and overall deconditioning leading to the following functional deficits: dependent for ADLs, IADLs, unable to transfer to car, dependent transfers at home with hoyer lift, unable to walk or stand without significant assistance, difficulty with W/C navigation..    Limitations  Sitting;Lifting;Standing;House hold activities;Writing;Walking;Other (comment)   stairs, transferring   Diagnostic tests  Brain MRI 11/01/2017: IMPRESSION: 1. Acute right ACA territory infarct as demonstrated on prior CT imaging. No intracranial hemorrhage or significant mass effect. 2. 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    Patient Stated Goals  wants to be able to walk again    Currently in Pain?  Yes    Pain Score  3    up to 5/10 when he moves knee   Pain Location  Knee    Pain Orientation  Left;Medial    Pain Type  Acute pain    Pain Onset  Yesterday    Pain Frequency  Constant    Aggravating Factors   end range flexion or extension         [facility] PT Assessment - 06/12/18 1853      Assessment   Medical Diagnosis  CVA    Referring Provider (PT)  Dr. [name], Guy Begin    Onset Date/Surgical Date  11/02/18    Hand Dominance  Right    Next MD Visit  this month    Prior Therapy  acute care PT, SNF based rehab that improved function, some HHPT. Last seen a week or so      Precautions   Precautions  None;Other (comment)    Precaution Comments  left hemiplegia    Required Braces or Orthoses  --   left leg brace has been reccomended     Restrictions   Weight Bearing Restrictions  No      Balance Screen   Has the patient fallen in the past 6 months  Yes    How many times?  1   fell out of chair yesterday and flexed left knee maximally     [place] residence    Living Arrangements  Spouse/significant other    Available Help at Discharge   Family;Personal care attendant;Available PRN/intermittently   two ladies come at about 4 daily to help; he met them at SNF   Type of [place] in [place] entrance   in back of house, door has handles on it  Home Layout  One level    Home Equipment  Wheelchair - manual;Other (comment);Hospital bed;Bedside commode;Walker - 2 wheels   hoyer lift, bedpan,    Additional Comments  haven't tried getting to the bathroom, unsure if he can get in. 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 Decreased muscle mass noted in left LE compared to R.   NEUROLOGICAL: Dermatomes: BUE intact and equal ot light touch except bilateral loss of sensation at bottom of feet. Myotomes: see strength testing below.  PERIPHERAL JOINT MOTION (AROM/PROM in degrees):  *Indicates pain Hip  - Flexion: WFL - Extension: Difficulty extending bilaterally with attempts to stand, unable to fully evaluate this session.  - Abduction: WFL - Adduction: WFL Knee - Flexion: R = WFL, L = pain with flexion past 120 degrees..  - Extension: R = WFL, L = pain with extension, lacking about 10-5 degrees. . - Ankle: PROM appears grossly adequate for basic ambulation.   STRENGTH:  *Indicates pain Hip  - Flexion: R =  4/5, L = 1/5. - Abduction (seated): R = 3/5, L = 1/5. - Adduction (seated): R = 3/5, L = 1/5. Knee - Ext: R = 4+/5, L = 1/5. Ankle (seated position) - Dorsiflexion: R = 3/5, L = 1/5. - Plantarflexion: R = 3+/5, L = 0/5. - Eversion: R = 3+/5, L = 1/5. - Able to wiggle toes on left  FUNCTIONAL MOBILITY: - Bed mobility: unable to test due to lack of successful chair to mat transfer - Transfers: STS transfer to RW from/to w/c with max A +3 (one on each side and one blocking chair), blocking left knee. Did not complete pivot as patient fatigued quickly.  - Gait: unable at this time - Stairs: unable at this time - W/C propulsion: dependent at this time - Car transfers: unable at this time, came by transport van.    Objective measurements completed on examination: See above findings.    TREATMENT:  Therapeutic activities: for functional strengthening and improved functional activity tolerance. - STS transfer to RW from/to w/c with max A +3, blocking left knee. Did not complete pivot as patient fatigued quickly. STS transfer with max A +1 blocking left knees with no AD, able to stand up taller. 2nd attempt at +2 (2nd person to stabilize w/c), unsuccessful as pt fatigued quickly.  - Education on diagnosis, prognosis, POC, anatomy and physiology of current condition.  Educated patient and daughter.    PT Education - 06/12/18 1910    Education Details  Exercise purpose/form. Self management techniques. Education on diagnosis, prognosis, POC, anatomy and physiology of current condition    Person(s) Educated  Patient    Methods  Explanation;Demonstration;Tactile cues;Verbal cues    Comprehension  Verbalized understanding       PT Short Term Goals - 06/12/18 1919      PT SHORT TERM GOAL #1   Title  Be independent with initial home exercise program for self-management of symptoms.    Baseline  HEP to be given at second session    Time  2    Period  Weeks    Status  New    Target Date   06/26/18         PT Long Term Goals - 06/12/18 1920      PT LONG TERM GOAL #1   Title  Patient will complete all bed mobility with min A to improve functional independence for getting in and out of bed and adjusting in bed.     Time  12    Period  Weeks    Status  New    Target Date  09/04/18      PT LONG TERM GOAL #2   Title  Patient will complete sit <> stand transfer chair to chair with Min A and LRAD to improve functional independence for household and community mobility.     Baseline  max A +2 STS at W/C (06/12/2018);     Time  12    Period  Weeks    Status  New    Target Date  09/04/18      PT LONG TERM GOAL #3   Title  Patient will navigate manual w/c with min A x 100 feet to improve mobility for household and short community distances.     Baseline  required total A (06/12/2018)    Time  12    Period  Weeks    Status  New    Target Date  07/24/18      PT LONG TERM GOAL #4   Title  Patient will complete car transfers W/C to family SUV with min A using LRAD to improve his abilty to participate in community activities.     Baseline  unable to complete car transfers at all (06/12/2018);     Time  12    Period  Weeks    Status  New    Target Date  09/04/18      PT LONG TERM GOAL #5   Title  Patient will ambulate at least 150 feet with mod I using LRAD to improve mobility for household and community distances.     Baseline  unable to take steps (06/12/2018);     Time  12    Period  Weeks    Status  New    Target Date  09/04/18      Additional Long Term Goals   Additional Long Term Goals  Yes      PT LONG TERM GOAL #6   Title  Patient will navigate 4 steps with min A and BUE support to improve his ability to access community and social participation.     Baseline  unable to complet stairs (06/12/2018)    Time  12    Period  Weeks    Status  New    Target Date  09/04/18             Plan - 06/12/18 1912    Clinical Impression Statement  Patient is a 73 y.o.  male referred to outpatient physical therapy with a medical diagnosis of CVA who presents with signs and symptoms consistent with left hemiplegia and deconditioning following CVA. Patient required max A+2 for sit <>  stand transfer to RW from/to w/c, total A for w/c mobility, and was unable to ambulate or safely attempt stairs today. [name] strength in LLE is grossly 1/5 except hip flexion is 2-/5. Patient can sit with back unsupported for about 10 seconds before leaning back in fatigue. Patient presents with significant strength, coordination, sensation, endurance, and balance impairments that are limiting ability to complete basic ADLs, IADLs and functional mobility including bed mobility, transfers, ambulation, stairs, and wheelchair navigation without difficulty. This is limiting his ability to participate in community and social settings. Patient will benefit from skilled physical therapy intervention to address current body structure impairments and activity limitations to improve function and work towards goals set in current POC in order to return to prior level of function or maximal functional improvement.     Personal Factors and Comorbidities  Age;Comorbidity 3+    Comorbidities  Relevant past medical history and comorbidities include long term steroid use for lupus, CKD, chronic osteomyelitis, cervical pine stenosis, BPH, APS, alcohol abuse, peripheral artery disease, depression, GERD, obstructive sleep apnea, HTN, IBS, IgA deficiency, lumbar radiculopathy, RA, Stroke, toxic maculopathy in both eyes, systemic lupus erythematosus related syndrome, cardiac catheterization, carotid endarterectomy, cervical laminectomy, coronary angioplasty, Fusion C5-C7, knee arthroplasty, lumbar surgery.     Examination-Activity Limitations  Bed Mobility;Bend;Sit;Toileting;Stand;Stairs;Lift;Transfers;Squat;Locomotion Level;Carry;Dressing;Hygiene/Grooming;Continence    Examination-Participation Restrictions  [place]ard  Work;Interpersonal Relationship;Community Activity    Stability/Clinical Decision Making  Evolving/Moderate complexity    Clinical Decision Making  Moderate    Rehab Potential  Fair    PT Frequency  2x / week    PT Duration  12 weeks    PT Treatment/Interventions  ADLs/Self Care Home Management;Electrical Stimulation;Moist Heat;Cryotherapy;Gait training;Stair training;Functional mobility training;Therapeutic exercise;Balance training;Neuromuscular re-education;Cognitive remediation;Patient/family education;Orthotic Fit/Training;Wheelchair mobility training;Manual techniques;Passive range of motion;Energy conservation;Joint Manipulations    PT Next Visit Plan  work on functional strength and establish HEP    PT Home Exercise Plan  will establish next session    Recommended Other Services  OT, SLP, possible left AFO    Consulted and Agree with Plan of Care  Patient;Family member/caregiver    Family Member Consulted  daugher       Patient will benefit from skilled therapeutic intervention in order to improve the following deficits and impairments:  Abnormal gait, Decreased activity tolerance, Decreased cognition, Decreased endurance, Decreased knowledge of use of DME, Decreased range of motion, Decreased skin integrity, Decreased strength, Impaired perceived functional ability, Impaired sensation, Impaired UE functional use, Improper body mechanics, Pain, Cardiopulmonary status limiting activity, Decreased balance, Decreased coordination, Decreased mobility, Difficulty walking, Impaired tone, Postural dysfunction  Visit Diagnosis: Hemiplegia and hemiparesis following cerebral infarction affecting left non-dominant side (HCC)  Muscle weakness (generalized)  Difficulty in walking, not elsewhere classified  Other lack of coordination     Problem List There are no active problems to display for this patient.  Everlean Alstrom. Graylon Good, PT, DPT 06/12/18, 7:37 PM  . [place] [facility] [address] [place], [address] Phone: [phone]   Fax:  [phone]  Name: Kenneth Summers MRN: [id number] Date of Birth: May 07, 1945

## 2018-06-12 NOTE — Therapy (Addendum)
[place] MAIN [facility] SERVICES [address] [place], [place], [address] Phone: [phone]   Fax:  [phone]  Occupational Therapy Evaluation  Patient Details  Name: Kenneth Summers MRN: [id number] Date of Birth: 09-13-1945 No data recorded  Encounter Date: 06/12/2018  OT End of Session - 06/12/18 1648    Visit Number  1    Number of Visits  24    Date for OT Re-Evaluation  09/04/18    Authorization Type  Progress report period starting 06/12/2018    OT Start Time  1525    OT Stop Time  1630    OT Time Calculation (min)  65 min    Activity Tolerance  Patient tolerated treatment well    Behavior During Therapy  [facility] for tasks assessed/performed       Past Medical History:  Diagnosis Date  . Alcohol abuse   . APS (antiphospholipid syndrome) ([place])   . BPH (benign prostatic hyperplasia)   . CAD (coronary artery disease)   . Cellulitis   . Cervical spinal stenosis   . Chronic osteomyelitis ([place])   . CKD (chronic kidney disease)   . CKD (chronic kidney disease)   . Clostridium difficile diarrhea   . Depression   . Diplopia   . Fatigue   . GERD (gastroesophageal reflux disease)   . Hyperlipemia   . Hypertension   . Hypovitaminosis D   . IBS (irritable bowel syndrome)   . 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 Insomnia   . Left lumbosacral radiculopathy   . Moderate obstructive sleep apnea   . Osteomyelitis of foot ([place])   . PAD (peripheral artery disease) ([place])   . Pruritus   . RA (rheumatoid arthritis) ([place])   . Radiculopathy   . Restless legs syndrome   . RLS (restless legs syndrome)   . SLE (systemic lupus erythematosus related syndrome) ([place])   . Stroke ([place])   . Toxic maculopathy of both eyes     Past Surgical History:  Procedure Laterality Date  . CARDIAC CATHETERIZATION    . CAROTID ENDARTERECTOMY    . CERVICAL LAMINECTOMY    . CORONARY ANGIOPLASTY    . FRACTURE SURGERY    . fusion C5-6-7    . HERNIA REPAIR    . KNEE  ARTHROSCOPY    . TONSILLECTOMY      There were no vitals filed for this visit.  Subjective Assessment - 06/12/18 1638    Subjective   Pt.'s daughter requested information about sit to stand lifts, as pt. currently uses a hoyer lift.    Pertinent History  Pt. is a 73 y.o. 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 Pt. PMHx includes: Multiple Falls, Lupus, DJD, Renal Abscess, CVA. Pt. resides with his wife. Pt.'s wife and daughter assist with ADLs. Pt. has caregivers in  for 2 hours a day, 6 days a week. Pt. received Rehab services in acute care, at SNF for STR, and [place] services. Pt. is retired from [employer] for [employer] and [employer].    Patient Stated Goals  To regain use of his left UE, and do more for himself.    Currently in Pain?  No/denies        [facility] OT Assessment - 06/12/18 1437      Assessment   Medical Diagnosis  CVA    Onset Date/Surgical Date  11/01/17    Hand Dominance  Right    Prior  Therapy  Acute Care, PT, SNF, HHOT      Precautions   Precautions  None    Precaution Comments  --   Left hemiplegia     Balance Screen   Has the patient fallen in the past 6 months  Yes    How many times?  --   multiple Falls.   Has the patient had a decrease in activity level because of a fear of falling?   Yes    Is the patient reluctant to leave their home because of a fear of falling?   Yes      Home  Environment   Family/patient expects to be discharged to:  Private residence    Living Arrangements  Spouse/significant other    Type of Home  Other (Comment)    [place] to live on main level with bedroom/bathroom    Engineer, agricultural  Yes    How accessible  Accessible via wheelchair    Adaptive equipment  Reacher;Long-handled Chartered certified accountant - 2 wheels;Cane - single point;Shower seat - built  in;Wheelchair - manual    Lives With  Spouse      Prior Function   Level of Independence  Independent    Vocation  Retired    Biomedical scientist  --   Glass blower/designer: [employer], anything outdoors      ADL   Eating/Feeding  Independent   Assist for set-up, and items cut/prepared    Grooming  Set up    Grooming Patient Percentage  --   oral  care, and shaving with an Marketing executive Bathing  + 2 Total assistance    Lower Body Bathing  + 2 Total assistance    Upper Body Dressing  Moderate assistance;Maximal assistance    Lower Body Dressing  +1 Total aassistance    Toilet Transfer  + 1 Total assistance;Other (comment)   Building surveyor  + 1 Total assistance    Toileting -  Hygiene  + 1 Total assistance    Tub/Shower Transfer  + 1 Total assistance      IADL   Prior Level of Function Shopping  --   Independent   Shopping  Completely unable to shop    Prior Level of Function Light Housekeeping  --   Independent   Light Housekeeping  Does not participate in any housekeeping tasks    Prior Level of Function Meal Prep  --   Independent   Meal Prep  Needs to have meals prepared and served    [employer] on family or friends for transportation    Medication Management  Is not capable of dispensing or managing own medication    Prior Level of Function Financial Management  --   independent   Financial Management  Dependent      Mobility   Mobility Status  History of falls      Written Expression   Dominant Hand  Right    Handwriting  50% legible      Vision - History   Baseline Vision  Wears glasses only for reading      Vision Assessment   Diplopia Assessment  --   Diplopia: side/side, & on  top, remains with one eye closed     Activity Tolerance   Activity Tolerance  Tolerates < 10 min activity with changes in vital signs      Cognition   Overall Cognitive  Status  Impaired/Different from baseline    Area of Impairment  Memory    Attention  --   limited concentration during reading.   Memory  Impaired      Sensation   Light Touch  Appears Intact    Proprioception  Appears Intact      Coordination   Right 9 Hole Peg Test  1 min. & 8 sec.   Dropped multiple pegs   Left 9 Hole Peg Test  2 min. & 12 sec.    Coordination  --   impaired     AROM   Overall AROM Comments  RUE WNL. Left sitting shoulder flexion: 70(96), abduction: 60(76), Elbow range10(0)-132, wrist extension 60, flexion 40.       Strength   Overall Strength Comments  RUE: 4-/5 overall, LUE: shoulder flexion, abduction 3-/5, elbow flexion, extension, wrist flexion, extension: 3+/5       Hand Function   Right Hand Grip (lbs)  40    Right Hand Lateral Pinch  13 lbs    Right Hand 3 Point Pinch  7 lbs    Left Hand Grip (lbs)  25    Left Hand Lateral Pinch  8 lbs    Left 3 point pinch  8 lbs                      OT Education - 06/12/18 1647    Education Details  OT services, UE functioning, goals, POC    Person(s) Educated  Patient;Child(ren)    Methods  Explanation    Comprehension  Verbalized understanding;Returned demonstration          OT Long Term Goals - 06/12/18 1701      OT LONG TERM GOAL #1   Title  Pt. will increase left shoulder flexion AROM by 10 degrees to access cabinet/shelf.    Baseline  Eval: Left shoulder flexion 70(96)    Time  12    Period  Weeks    Status  New    Target Date  09/04/18      OT LONG TERM GOAL #2   Title  Pt. will donn a shirt with Supervision.    Baseline  Eval:  ModA    Time  12    Period  Weeks    Status  New    Target Date  09/02/18      OT LONG TERM GOAL #3   Title  Pt. will require ModA to perform LE dressing    Baseline  Eval: Total    Period  Weeks    Status  New    Target Date  09/04/18      OT LONG TERM GOAL #4   Title  Pt. will improve left grip strength by 10# to be able to open a  jar/container.    Baseline  Eval:  Left: 25, Right 40    Time  12    Period  Weeks    Target Date  09/04/18      OT LONG TERM GOAL #5   Title  Pt. will improve left hand [facility] skills to be able to assist with buttoning/zipping.    Baseline  Eval: Left: 2 min. & 12 sec., R: 1 min. & 8 sec.  Time  12    Period  Weeks    Status  New    Target Date  09/04/18      Long Term Additional Goals   Additional Long Term Goals  Yes      OT LONG TERM GOAL #6   Title  Pt. will demonstrate visual conmpensatory staregies during 100% of the time during self care.    Baseline  Eval: Diplopia. Unable    Time  12    Period  Weeks    Status  New    Target Date  09/04/18      OT LONG TERM GOAL #7   Title  Pt. will prepare a simple cold snack from w/c with supervision using cognitive compensatory staregies 100% of the time.    Baseline  Eval: unable    Time  12    Period  Weeks    Status  New    Target Date  09/04/18      OT LONG TERM GOAL #8   Title  Pt. will accurately identify potential saeftey hazard using good safety awareness, and judgement 100% for ADLs, and IADLs.    Baseline  Eval: unable    Time  12    Period  Weeks    Status  New    Target Date  09/04/18            Plan - 06/12/18 1649    Clinical Impression Statement  Pt. is a 73 y.o. male who was diagnosed with a CVA resulting in left hemiplegia. Pt. presents with LUE weakness, Diplopia, decreased motor control, impaired cognition, and impaired [facility] skills. which limit his ability to engage in, and perform ADL, and IADL tasks. Pt. requires Hoyer lift for transfers at home. Pt. has hired caregiver aides assist with daily care. Pt. will benfit from OT services to improve UE functioning, provide pt./caregiver education about visual, and cognitiive compensatory strategies, and to work on improving increased engagement in basic ADL care to maximize independence, and decrease caregiver burden.    Occupational performance deficits  (Please refer to evaluation for details):  ADL's    Body Structure / Function / Physical Skills  ADL;Coordination;Balance;IADL;Pain;FMC;ROM;Tone;Proprioception;Decreased knowledge of use of DME;Vision;Strength;UE functional use    Cognitive Skills  Attention;Memory;Emotional;Problem Solve;Safety Awareness    Clinical Decision Making  Multiple treatment options, significant modification of task necessary    Comorbidities Affecting Occupational Performance:  Presence of comorbidities impacting occupational performance    Comorbidities impacting occupational performance description:  Phyical, cognitive, visual,  medical comorbidities    Modification or Assistance to Complete Evaluation   Max significant modification of tasks or assist is necessary to complete    OT Frequency  2x / week    OT Duration  12 weeks    OT Treatment/Interventions  Self-care/ADL training;DME and/or AE instruction;Therapeutic exercise;Therapeutic activities;Moist Heat;Cognitive remediation/compensation;Neuromuscular education;Visual/perceptual remediation/compensation;Coping strategies training;Patient/family education;Passive range of motion;Psychosocial skills training;Energy conservation;Functional Mobility Training    Recommended Other Services  PT    Consulted and Agree with Plan of Care  Patient;Family member/caregiver    Family Member Consulted  Daughter [name]       Patient will benefit from skilled therapeutic intervention in order to improve the following deficits and impairments:  Cognitive Skills, Marketing executive / Function / Physical Skills  Visit Diagnosis: Muscle weakness (generalized)  Other lack of coordination  Feeding difficulties  Repeated falls    Problem List There are no active problems to display for this patient.   Margaretha Sheffield  Davonda Ausley, MS, OTR/L 06/12/2018, 5:19 PM  [place] MAIN [facility] SERVICES [address] [place], [place], [address] Phone:  [phone]   Fax:  [phone]  Name: Kenneth Summers MRN: [id number] Date of Birth: 05/30/1945

## 2018-06-17 ENCOUNTER — Ambulatory Visit: Payer: Medicare Other | Admitting: Physical Therapy

## 2018-06-17 ENCOUNTER — Encounter: Payer: Self-pay | Admitting: Occupational Therapy

## 2018-06-17 ENCOUNTER — Ambulatory Visit: Payer: Medicare Other | Admitting: Occupational Therapy

## 2018-06-17 ENCOUNTER — Encounter: Payer: Self-pay | Admitting: Physical Therapy

## 2018-06-17 ENCOUNTER — Other Ambulatory Visit: Payer: Self-pay

## 2018-06-17 DIAGNOSIS — I69354 Hemiplegia and hemiparesis following cerebral infarction affecting left non-dominant side: Secondary | ICD-10-CM | POA: Diagnosis not present

## 2018-06-17 DIAGNOSIS — R278 Other lack of coordination: Secondary | ICD-10-CM

## 2018-06-17 DIAGNOSIS — M6281 Muscle weakness (generalized): Secondary | ICD-10-CM

## 2018-06-17 DIAGNOSIS — R262 Difficulty in walking, not elsewhere classified: Secondary | ICD-10-CM

## 2018-06-17 NOTE — Therapy (Addendum)
[facility] [address] [place], [address] Phone: [phone]   Fax:  [phone]  Physical Therapy Treatment  Patient Details  Name: Kenneth Summers MRN: [id number] Date of Birth: [birth year] Referring Provider (PT): Dr. [name], Guy Begin   Encounter Date: 06/17/2018  PT End of Session - 06/17/18 1820    Visit Number  2    Number of Visits  24    Date for PT Re-Evaluation  09/04/18    Authorization Type  Medicare reporting period from 06/12/2018    Authorization Time Period  Current cert period 02/10/4006 - 09/04/2018    Authorization - Visit Number  2    Authorization - Number of Visits  10    PT Start Time  1330    PT Stop Time  1430    PT Time Calculation (min)  60 min    Equipment Utilized During Treatment  Gait belt    Activity Tolerance  Patient tolerated treatment well;Patient limited by fatigue    Behavior During Therapy  [facility] for tasks assessed/performed       Past Medical History:  Diagnosis Date  . Alcohol abuse   . APS (antiphospholipid syndrome) ([place])   . BPH (benign prostatic hyperplasia)   . CAD (coronary artery disease)   . Cellulitis   . Cervical spinal stenosis   . Chronic osteomyelitis ([place])   . CKD (chronic kidney disease)   . CKD (chronic kidney disease)   . Clostridium difficile diarrhea   . Depression   . Diplopia   . Fatigue   . GERD (gastroesophageal reflux disease)   . Hyperlipemia   . Hypertension   . Hypovitaminosis D   . IBS (irritable bowel syndrome)   . IgA deficiency ([place])   . Insomnia   . Left lumbosacral radiculopathy   . Moderate obstructive sleep apnea   . Osteomyelitis of foot ([place])   . PAD (peripheral artery disease) ([place])   . Pruritus   . RA (rheumatoid arthritis) ([place])   . Radiculopathy   . Restless legs syndrome   . RLS (restless legs syndrome)   . SLE (systemic lupus erythematosus related syndrome) ([place])   . Stroke ([place])   . Toxic  maculopathy of both eyes     Past Surgical History:  Procedure Laterality Date  . CARDIAC CATHETERIZATION    . CAROTID ENDARTERECTOMY    . CERVICAL LAMINECTOMY    . CORONARY ANGIOPLASTY    . FRACTURE SURGERY    . fusion C5-6-7    . HERNIA REPAIR    . KNEE ARTHROSCOPY    . TONSILLECTOMY      There were no vitals filed for this visit.  Subjective Assessment - 06/17/18 1817    Subjective  Patient accompanied by his daughter and caregiver [name] who participated in today's treatment session. Patient and [name] report that patient is feeling well today with no pain. He was not as tired as expected following last treatment session.     Patient is accompained by:  Family member   intermittantly; she thought she could not come in   Pertinent History  Patient is a 73 y.o. male who presents to outpatient physical therapy with a referral for medical diagnosis of CVA. This patient's chief complaints consist of left hemiplegia and overall deconditioning leading to the following functional deficits: dependent for ADLs, IADLs, unable to transfer to car, dependent transfers at home with hoyer lift, unable to walk or stand without  significant assistance, difficulty with W/C navigation..    Limitations  Sitting;Lifting;Standing;House hold activities;Writing;Walking;Other (comment)   stairs, transferring   Diagnostic tests  Brain MRI 11/01/2017: IMPRESSION: 1. Acute right ACA territory infarct as demonstrated on prior CT imaging. No intracranial hemorrhage or significant mass effect. 2. 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    Patient Stated Goals  wants to be able to walk again    Currently in Pain?  No/denies    Pain Onset  Yesterday        TREATMENT:  Ace bandage dorsiflexion assist applied throughout treatment.   Therapeutic activities: for functional strengthening and improved functional activity tolerance. - Squat pivot transfer x 4 reps with max A +2  (daughter bracing w/c): w/c <> slightly elevated plinth and w/c <> large NuStep. No AD used. R knee blocked. Pt grasping around back of clinician.  - STS transfer from/to very elevated plinth using RW and max A, blocking L knee with physical assist from gait belt and facilitation at glutes as able. Patient varied in ability to extend hips and knees. Most attempts he lacked about 20 degrees hip and R knee extension, but he improved with practice to more confidently come to standing. Notable for backward lean that improved with repetitions but worsened with fatigue while standing. Stood up to 10 seconds at a time. Completed STS about 8-10 reps with rests as needed.   Therapeutic exercise: to centralize symptoms and improve ROM, strength, muscular endurance, and activity tolerance required for successful completion of functional activities.  - seated unsupported trunk balance strength with intermittent min A to prevent lean back or left. Intermittent cuing to improve posture with ability to improve siting posture for a few seconds at a time. Daughter states this is a big improvement from previously. Best able to maintain posture with BUE grasp around edge of plinth. Clinician increasing challenge by moving hands to lap repeatedly.  - continued to practice sitting stability during seated LE exercises at edge of plinth: Seated marching AROM on R and AAROM on left alternating sides to mimic ambulation x 10 each side. Hip abduction against ball between knees x 10.  - NuStep level 1 using bilateral upper and lower extremities. Seat/handle setting 16. For improved extremity mobility, muscular endurance, and activity tolerance; stimulate improved joint nutrition, practice improved posture, and improve cross limb motion for ambulation. x 10  Minutes with close supervision - min A to prevent falling to left off machine. Required max A assistance to get on/off and total assistance to set up machine.    PT Education -  06/17/18 1819    Education Details  Exercise purpose/form. Self management techniques. Education on diagnosis, prognosis, POC, anatomy and physiology of current condition    Person(s) Educated  Patient;Child(ren);Caregiver(s)   Daughter [name]   Methods  Explanation;Demonstration;Tactile cues;Verbal cues    Comprehension  Verbalized understanding;Returned demonstration;Verbal cues required;Tactile cues required;Need further instruction       PT Short Term Goals - 06/12/18 1919      PT SHORT TERM GOAL #1   Title  Be independent with initial home exercise program for self-management of symptoms.    Baseline  HEP to be given at second session    Time  2    Period  Weeks    Status  New    Target Date  06/26/18        PT Long Term Goals - 06/12/18 1920      PT LONG  TERM GOAL #1   Title  Patient will complete all bed mobility with min A to improve functional independence for getting in and out of bed and adjusting in bed.     Time  12    Period  Weeks    Status  New    Target Date  09/04/18      PT LONG TERM GOAL #2   Title  Patient will complete sit <> stand transfer chair to chair with Min A and LRAD to improve functional independence for household and community mobility.     Baseline  max A +2 STS at W/C (06/12/2018);     Time  12    Period  Weeks    Status  New    Target Date  09/04/18      PT LONG TERM GOAL #3   Title  Patient will navigate manual w/c with min A x 100 feet to improve mobility for household and short community distances.     Baseline  required total A (06/12/2018)    Time  12    Period  Weeks    Status  New    Target Date  07/24/18      PT LONG TERM GOAL #4   Title  Patient will complete car transfers W/C to family SUV with min A using LRAD to improve his abilty to participate in community activities.     Baseline  unable to complete car transfers at all (06/12/2018);     Time  12    Period  Weeks    Status  New    Target Date  09/04/18      PT LONG TERM  GOAL #5   Title  Patient will ambulate at least 150 feet with mod I using LRAD to improve mobility for household and community distances.     Baseline  unable to take steps (06/12/2018);     Time  12    Period  Weeks    Status  New    Target Date  09/04/18      Additional Long Term Goals   Additional Long Term Goals  Yes      PT LONG TERM GOAL #6   Title  Patient will navigate 4 steps with min A and BUE support to improve his ability to access community and social participation.     Baseline  unable to complet stairs (06/12/2018)    Time  12    Period  Weeks    Status  New    Target Date  09/04/18            Plan - 06/17/18 1839    Clinical Impression Statement  Patient tolerated treatment well and is making appropriate progress at this point. He was very cooperative and daughter was very helpful and engaged throughout the session, enhancing the effectiveness of clinician through offering encouragement to patient, insight into his experience, and supervising, lightly assisting pt or bracing wheelchair when a second hand was needed for optimal care. Patient was able to complete significantly more tranfers and weight bearing activities today. He showed improvements in trunk stability and within visit improvement in standing ability. According to [name], pt has ADHD and is very distractible, which was evident throughout the visit. He required cues to stay on task often. He did not have any dizziness with standing, demonstrating improvements from prior attempts at previous rehabilitation settings. Patient was appropriately fatigued following session. Patient presents with significant strength, coordination, sensation, endurance,  and balance impairments that are limiting ability to complete basic ADLs, IADLs and functional mobility including bed mobility, transfers, ambulation, stairs, and wheelchair navigation without difficulty. This is limiting his ability to participate in community and social  settings. Patient will benefit from skilled physical therapy intervention to address current body structure impairments and activity limitations to improve function and work towards goals set in current POC in order to return to prior level of function or maximal functional improvement.    Personal Factors and Comorbidities  Age;Comorbidity 3+    Comorbidities  Relevant past medical history and comorbidities include long term steroid use for lupus, CKD, chronic osteomyelitis, cervical pine stenosis, BPH, APS, alcohol abuse, peripheral artery disease, depression, GERD, obstructive sleep apnea, HTN, IBS, IgA deficiency, lumbar radiculopathy, RA, Stroke, toxic maculopathy in both eyes, systemic lupus erythematosus related syndrome, cardiac catheterization, carotid endarterectomy, cervical laminectomy, coronary angioplasty, Fusion C5-C7, knee arthroplasty, lumbar surgery.     Examination-Activity Limitations  Bed Mobility;Bend;Sit;Toileting;Stand;Stairs;Lift;Transfers;Squat;Locomotion Level;Carry;Dressing;Hygiene/Grooming;Continence    Examination-Participation Restrictions  Yard Work;Interpersonal Relationship;Community Activity    Stability/Clinical Decision Making  Evolving/Moderate complexity    Rehab Potential  Fair    PT Frequency  2x / week    PT Duration  12 weeks    PT Treatment/Interventions  ADLs/Self Care Home Management;Electrical Stimulation;Moist Heat;Cryotherapy;Gait training;Stair training;Functional mobility training;Therapeutic exercise;Balance training;Neuromuscular re-education;Cognitive remediation;Patient/family education;Orthotic Fit/Training;Wheelchair mobility training;Manual techniques;Passive range of motion;Energy conservation;Joint Manipulations    PT Next Visit Plan  work on functional strength and establish HEP    PT Home Exercise Plan  will establish next session    Consulted and Agree with Plan of Care  Patient;Family member/caregiver    Family Member Consulted  daugher        Patient will benefit from skilled therapeutic intervention in order to improve the following deficits and impairments:  Abnormal gait, Decreased activity tolerance, Decreased cognition, Decreased endurance, Decreased knowledge of use of DME, Decreased range of motion, Decreased skin integrity, Decreased strength, Impaired perceived functional ability, Impaired sensation, Impaired UE functional use, Improper body mechanics, Pain, Cardiopulmonary status limiting activity, Decreased balance, Decreased coordination, Decreased mobility, Difficulty walking, Impaired tone, Postural dysfunction  Visit Diagnosis: Hemiplegia and hemiparesis following cerebral infarction affecting left non-dominant side (HCC)  Muscle weakness (generalized)  Difficulty in walking, not elsewhere classified  Other lack of coordination     Problem List There are no active problems to display for this patient.   Everlean Alstrom. Graylon Good, PT, DPT 06/17/18, 6:40 PM  [facility] [address], [place], [address] Phone: [phone]   Fax:  [phone]  Name: MICKEAL DAWS MRN: [id number] Date of Birth: 02/26/1945

## 2018-06-17 NOTE — Therapy (Signed)
[place] [facility] SERVICES [address] [place], [place], [address] Phone: [phone]   Fax:  [phone]  Occupational Therapy Treatment  Patient Details  Name: Kenneth Summers MRN: [id number] Date of Birth: 09-Feb-1945 No data recorded  Encounter Date: 06/17/2018  OT End of Session - 06/17/18 1659    Visit Number  2    Number of Visits  24    Date for OT Re-Evaluation  09/04/18    Authorization Type  Progress report period starting 06/12/2018    OT Start Time  1430    OT Stop Time  1515    OT Time Calculation (min)  45 min    Activity Tolerance  Patient tolerated treatment well    Behavior During Therapy  [facility] for tasks assessed/performed       Past Medical History:  Diagnosis Date  . Alcohol abuse   . APS (antiphospholipid syndrome) ([place])   . BPH (benign prostatic hyperplasia)   . CAD (coronary artery disease)   . Cellulitis   . Cervical spinal stenosis   . Chronic osteomyelitis ([place])   . CKD (chronic kidney disease)   . CKD (chronic kidney disease)   . Clostridium difficile diarrhea   . Depression   . Diplopia   . Fatigue   . GERD (gastroesophageal reflux disease)   . Hyperlipemia   . Hypertension   . Hypovitaminosis D   . IBS (irritable bowel syndrome)   . 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 Insomnia   . Left lumbosacral radiculopathy   . Moderate obstructive sleep apnea   . Osteomyelitis of foot ([place])   . PAD (peripheral artery disease) ([place])   . Pruritus   . RA (rheumatoid arthritis) ([place])   . Radiculopathy   . Restless legs syndrome   . RLS (restless legs syndrome)   . SLE (systemic lupus erythematosus related syndrome) ([place])   . Stroke ([place])   . Toxic maculopathy of both eyes     Past Surgical History:  Procedure Laterality Date  . CARDIAC CATHETERIZATION    . CAROTID ENDARTERECTOMY    . CERVICAL LAMINECTOMY    . CORONARY ANGIOPLASTY    . FRACTURE SURGERY    . fusion C5-6-7    . HERNIA REPAIR    . KNEE  ARTHROSCOPY    . TONSILLECTOMY      There were no vitals filed for this visit.  Subjective Assessment - 06/17/18 1658    Subjective   Pt.'s daughter was present with the pt.    Pertinent History  Pt. is a 73 y.o. 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 Pt. PMHx includes: Multiple Falls, Lupus, DJD, Renal Abscess, CVA. Pt. resides with his wife. Pt.'s wife and daughter assist with ADLs. Pt. has caregivers in  for 2 hours a day, 6 days a week. Pt. received Rehab services in acute care, at SNF for STR, and [facility] services. Pt. is retired from [employer] for [employer] and [employer].    Patient Stated Goals  To regain use of his left UE, and do more for himself.    Currently in Pain?  Yes    Pain Score  --   Not rated   Pain Location  Neck    Pain Orientation  Posterior      OT TREATMENT    Therapeutic Exercise:  Pt. performed gross gripping with grip strengthener. Pt. worked on sustaining grip while grasping pegs and  reaching at various heights. The gripper was placed in the 3rd resistive slot with the white resistive spring. Pt. required cues for the left side. The resistance was decreased to the 2nd slot 50% of the way through. Pt. worked on pinch strengthening in the left hand for lateral, and 3pt. pinch using yellow, red, green, and blue resistive clips. Pt. worked on placing the clips at various vertical and horizontal angles. Tactile and verbal cues were required for eliciting the desired movement. Pt. worked on grasping 1" resistive cubes alternating thumb opposition to the tip of the 2nd through 5th digits while the board is placed at a vertical angle. Pt. worked on pressing the cubes back into place while isolating 2 digit extension.  Selfcare:  Pt. was assisted with donning his zip sweatshirt. Pt. required visual cues, and verbal cues to stabilize the zipper with his left hand while zipping with his right  hand.                         OT Education - 06/17/18 1659    Education Details  OT services, UE functioning, goals, POC, grip, and pinch strength    Person(s) Educated  Patient;Child(ren)    Methods  Explanation    Comprehension  Verbalized understanding;Returned demonstration          OT Long Term Goals - 06/12/18 1701      OT LONG TERM GOAL #1   Title  Pt. will increase left shoulder flexion AROM by 10 degrees to access cabinet/shelf.    Baseline  Eval: Left shoulder flexion 70(96)    Time  12    Period  Weeks    Status  New    Target Date  09/04/18      OT LONG TERM GOAL #2   Title  Pt. will donn a shirt with Supervision.    Baseline  Eval: ModA    Time  12    Period  Weeks    Status  New    Target Date  09/02/18      OT LONG TERM GOAL #3   Title  Pt. will require ModA to perform LE dressing    Baseline  Eval: Total    Time  12    Period  Weeks    Status  New    Target Date  09/04/18      OT LONG TERM GOAL #4   Title  Pt. will improve left grip strength by 10# to be able to open a jar/container.    Baseline  Eval:  Left: 25, Right 40    Time  12    Period  Weeks    Target Date  09/04/18      OT LONG TERM GOAL #5   Title  Pt. will improve left hand [facility] skills to be able to assit with buttoning/zipping.    Baseline  Eval: Left: 2 min. & 12 sec., R: 1 min. & 8 sec.    Time  12    Period  Weeks    Status  New    Target Date  09/04/18      Long Term Additional Goals   Additional Long Term Goals  Yes      OT LONG TERM GOAL #6   Title  Pt. will demonstrate visual conmpensatory strategies during 100% of the time during self care.    Baseline  Eval: Diplopia. Unable    Time  12  Period  Weeks    Status  New    Target Date  09/04/18      OT LONG TERM GOAL #7   Title  Pt. will prepare a simple cold snack from w/c with supervision using cognitive compensatory strategies 100% of the time.    Baseline  Eval: unable    Time  12     Period  Weeks    Status  New    Target Date  09/04/18      OT LONG TERM GOAL #8   Title  Pt. will accurately identify potential saeftey hazard using good safety awareness, and judgement 100% for ADLs, and IADLs.    Baseline  Eval: unable    Time  12    Period  Weeks    Status  New    Target Date  09/04/18            Plan - 06/17/18 1700    Clinical Impression Statement  Pt. reported posterior neck discomfort. Pt. reported relief with position changes, and BioFreeze. Pt. presented was able to attend to tasks today with minimal cuing, and occassional redirection. Pt. Requires cues for left sided awareness, and to engage the left and during tasks.  Pt. continues to work on improving LUE functioning in preparation for improved ADL, and IADL functioning.    Occupational performance deficits (Please refer to evaluation for details):  ADL's    Body Structure / Function / Physical Skills  ADL;Coordination;Balance;IADL;Pain;FMC;ROM;Tone;Proprioception;Decreased knowledge of use of DME;Vision;Strength;UE functional use    Cognitive Skills  Attention;Memory;Emotional;Problem Solve;Safety Awareness    Clinical Decision Making  Multiple treatment options, significant modification of task necessary    Comorbidities Affecting Occupational Performance:  Presence of comorbidities impacting occupational performance    Comorbidities impacting occupational performance description:  Phyical, cognitive, visual,  medical comorbidities    Modification or Assistance to Complete Evaluation   Max significant modification of tasks or assist is necessary to complete    OT Frequency  2x / week    OT Duration  12 weeks    OT Treatment/Interventions  Self-care/ADL training;DME and/or AE instruction;Therapeutic exercise;Therapeutic activities;Moist Heat;Cognitive remediation/compensation;Neuromuscular education;Visual/perceptual remediation/compensation;Coping strategies training;Patient/family education;Passive range  of motion;Psychosocial skills training;Energy conservation;Functional Mobility Training    Recommended Other Services  PT    Consulted and Agree with Plan of Care  Patient;Family member/caregiver    Family Member Consulted  Daughter [name]       Patient will benefit from skilled therapeutic intervention in order to improve the following deficits and impairments:  Cognitive Skills, Body Structure / Function / Physical Skills  Visit Diagnosis: Muscle weakness (generalized)  Other lack of coordination    Problem List There are no active problems to display for this patient.   Harrel Carina, MS, OTR/L 06/17/2018, 5:07 PM  [place] MAIN [facility] SERVICES [address] [place], [place], [address] Phone: [phone]   Fax:  [phone]  Name: Kenneth Summers MRN: [id number] Date of Birth: 1945/11/15

## 2018-06-19 ENCOUNTER — Other Ambulatory Visit: Payer: Self-pay

## 2018-06-19 ENCOUNTER — Encounter: Payer: Self-pay | Admitting: Occupational Therapy

## 2018-06-19 ENCOUNTER — Ambulatory Visit: Payer: Medicare Other | Admitting: Occupational Therapy

## 2018-06-19 ENCOUNTER — Ambulatory Visit: Payer: Medicare Other | Admitting: Physical Therapy

## 2018-06-19 DIAGNOSIS — R278 Other lack of coordination: Secondary | ICD-10-CM

## 2018-06-19 DIAGNOSIS — I69354 Hemiplegia and hemiparesis following cerebral infarction affecting left non-dominant side: Secondary | ICD-10-CM | POA: Diagnosis not present

## 2018-06-19 DIAGNOSIS — M6281 Muscle weakness (generalized): Secondary | ICD-10-CM

## 2018-06-19 DIAGNOSIS — R262 Difficulty in walking, not elsewhere classified: Secondary | ICD-10-CM

## 2018-06-19 NOTE — Therapy (Signed)
[place] MAIN [facility] SERVICES [address] [place], [place], [address] Phone: [phone]   Fax:  [phone]  Occupational Therapy Treatment  Patient Details  Name: Kenneth Summers MRN: [id number] Date of Birth: 09-01-1945 No data recorded  Encounter Date: 06/19/2018  OT End of Session - 06/19/18 1642    Visit Number  3    Number of Visits  24    Date for OT Re-Evaluation  09/04/18    Authorization Type  Progress report period starting 06/12/2018    OT Start Time  1430    OT Stop Time  1520    OT Time Calculation (min)  50 min    Activity Tolerance  Patient tolerated treatment well    Behavior During Therapy  [facility] for tasks assessed/performed       Past Medical History:  Diagnosis Date  . Alcohol abuse   . APS (antiphospholipid syndrome) ([place])   . BPH (benign prostatic hyperplasia)   . CAD (coronary artery disease)   . Cellulitis   . Cervical spinal stenosis   . Chronic osteomyelitis ([place])   . CKD (chronic kidney disease)   . CKD (chronic kidney disease)   . Clostridium difficile diarrhea   . Depression   . Diplopia   . Fatigue   . GERD (gastroesophageal reflux disease)   . Hyperlipemia   . Hypertension   . Hypovitaminosis D   . IBS (irritable bowel syndrome)   . 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 Insomnia   . Left lumbosacral radiculopathy   . Moderate obstructive sleep apnea   . Osteomyelitis of foot ([place])   . PAD (peripheral artery disease) ([place])   . Pruritus   . RA (rheumatoid arthritis) ([place])   . Radiculopathy   . Restless legs syndrome   . RLS (restless legs syndrome)   . SLE (systemic lupus erythematosus related syndrome) ([place])   . Stroke ([place])   . Toxic maculopathy of both eyes     Past Surgical History:  Procedure Laterality Date  . CARDIAC CATHETERIZATION    . CAROTID ENDARTERECTOMY    . CERVICAL LAMINECTOMY    . CORONARY ANGIOPLASTY    . FRACTURE SURGERY    . fusion C5-6-7    . HERNIA REPAIR    . KNEE  ARTHROSCOPY    . TONSILLECTOMY      There were no vitals filed for this visit.  Subjective Assessment - 06/19/18 1642    Subjective   Pt.'s daughter was present with the pt.    Pertinent History  Pt. is a 73 y.o. 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 Pt. PMHx includes: Multiple Falls, Lupus, DJD, Renal Abscess, CVA. Pt. resides with his wife. Pt.'s wife and daughter assist with ADLs. Pt. has caregivers in  for 2 hours a day, 6 days a week. Pt. received Rehab services in acute care, at SNF for STR, and [place] services. Pt. is retired from [employer] for [employer] and [employer].    Patient Stated Goals  To regain use of his left UE, and do more for himself.    Currently in Pain?  No/denies       OT TREATMENT    Therapeutic Exercise:  Pt. worked on pinch strengthening in the left hand for lateral, and 3pt. pinch using yellow, and red resistive clips. Pt. worked on placing the clips at various vertical and horizontal angles. Tactile and verbal cues were required for  eliciting the desired movement. Pt. Performed hand strengthening with green theraputty. Pt. required cues for proper technique. Pt. worked on gross grip , lateral pinch, 3pt. pinch, gross digit extension, thumb opposition.  Pt. required verbal and tactile cues for proper technique. Pt. was provided with a HEP through [facility] with an access code.  Selfcare:  Pt. worked on UE dressing threading the  LUE first. Pt. required modA, and moderated cognitive cues for each step. Pt. required assist for the back of the sweater. Pt. worked on incorporating the Left hand during the task often reaching across midline to achieve this. Pt. worked on zipping his jacket using his left hand to fasten, and manipulate the zipper, and right hand for stabilization of the zipper. The pt. then attempted a second rep of zipping his zipper with his left hand.                        OT  Education - 06/19/18 1642    Education Details  OT services, UE functioning, goals, POC, grip, and pinch strength    Person(s) Educated  Patient;Child(ren)    Methods  Explanation    Comprehension  Verbalized understanding;Returned demonstration          OT Long Term Goals - 06/12/18 1701      OT LONG TERM GOAL #1   Title  Pt. will increase left shoulder flexion AROM by 10 degrees to access cabinet/shelf.    Baseline  Eval: Left shoulder flexion 70(96)    Time  12    Period  Weeks    Status  New    Target Date  09/04/18      OT LONG TERM GOAL #2   Title  Pt. will donn a shirt with Supervision.    Baseline  Eval: ModA    Time  12    Period  Weeks    Status  New    Target Date  09/02/18      OT LONG TERM GOAL #3   Title  Pt. will require ModA to perform LE dressing    Baseline  Eval: Total    Time  12    Period  Weeks    Status  New    Target Date  09/04/18      OT LONG TERM GOAL #4   Title  Pt. will improve left grip strength by 10# to be able to open a jar/container.    Baseline  Eval:  Left: 25, Right 40    Time  12    Period  Weeks    Target Date  09/04/18      OT LONG TERM GOAL #5   Title  Pt. will improve left hand [facility] skills to be able to assit with buttoning/zipping.    Baseline  Eval: Left: 2 min. & 12 sec., R: 1 min. & 8 sec.    Time  12    Period  Weeks    Status  New    Target Date  09/04/18      Long Term Additional Goals   Additional Long Term Goals  Yes      OT LONG TERM GOAL #6   Title  Pt. will demonstrate visual conmpensatory strategies during 100% of the time during self care.    Baseline  Eval: Diplopia. Unable    Time  12    Period  Weeks    Status  New    Target Date  09/04/18      OT LONG TERM GOAL #7   Title  Pt. will prepare a simple cold snack from w/c with supervision using cognitive compensatory strategies 100% of the time.    Baseline  Eval: unable    Time  12    Period  Weeks    Status  New    Target Date  09/04/18       OT LONG TERM GOAL #8   Title  Pt. will accurately identify potential saeftey hazard using good safety awareness, and judgement 100% for ADLs, and IADLs.    Baseline  Eval: unable    Time  12    Period  Weeks    Status  New    Target Date  09/04/18            Plan - 06/19/18 1643    Clinical Impression Statement  Pt. with improved posterior neck pain today, with less reports of discomfort, or repositioning needed. Pt. brought his w/c cushion headrest, however did not use it. Pt. and daughter reports the pt. has allergies pretty bad with a ruuny nose. Pt. appeared distracted by this with his mask today requiring cues for for redirection at times, and to reposition the mask. Pt. conitnues to work on improving LUE functioning in order to improve participation in ADL, and IADL functioning..    Occupational performance deficits (Please refer to evaluation for details):  ADL's    Body Structure / Function / Physical Skills  ADL;Coordination;Balance;IADL;Pain;FMC;ROM;Tone;Proprioception;Decreased knowledge of use of DME;Vision;Strength;UE functional use    Cognitive Skills  Attention;Memory;Emotional;Problem Solve;Safety Awareness    Comorbidities Affecting Occupational Performance:  Presence of comorbidities impacting occupational performance    Comorbidities impacting occupational performance description:  Phyical, cognitive, visual,  medical comorbidities    Modification or Assistance to Complete Evaluation   Max significant modification of tasks or assist is necessary to complete    OT Frequency  2x / week    OT Duration  12 weeks    OT Treatment/Interventions  Self-care/ADL training;DME and/or AE instruction;Therapeutic exercise;Therapeutic activities;Moist Heat;Cognitive remediation/compensation;Neuromuscular education;Visual/perceptual remediation/compensation;Coping strategies training;Patient/family education;Passive range of motion;Psychosocial skills training;Energy  conservation;Functional Mobility Training    Consulted and Agree with Plan of Care  Patient;Family member/caregiver    Family Member Consulted  Daughter [name]       Patient will benefit from skilled therapeutic intervention in order to improve the following deficits and impairments:  Cognitive Skills, Body Structure / Function / Physical Skills  Visit Diagnosis: Muscle weakness (generalized)  Other lack of coordination    Problem List There are no active problems to display for this patient.   Harrel Carina, MS, OTR/L 06/19/2018, 4:50 PM  [place] MAIN [facility] SERVICES [address] [place], [place], [address] Phone: [phone]   Fax:  [phone]  Name: Kenneth Summers MRN: [id number] Date of Birth: 1945-07-05

## 2018-06-20 ENCOUNTER — Encounter: Payer: Self-pay | Admitting: Physical Therapy

## 2018-06-20 NOTE — Therapy (Addendum)
[facility] [address], [place], [address] Phone: [phone]   Fax:  [phone]  Physical Therapy Treatment / Progress Note / Re-Certification Reporting Period: 06/12/2018 - 06/19/2018  Patient Details  Name: Kenneth Summers MRN: [id number] Date of Birth: [birth year] Referring Provider (PT): Dr. [name], Guy Begin   Encounter Date: 06/19/2018  PT End of Session - 06/20/18 0732    Visit Number  3    Number of Visits  24    Date for PT Re-Evaluation  09/11/18    Authorization Type  Medicare reporting period from 06/12/2018    Authorization Time Period  Current cert period 10/30/2681 - 09/11/2018    Authorization - Visit Number  3    Authorization - Number of Visits  10    PT Start Time  1330    PT Stop Time  1430    PT Time Calculation (min)  60 min    Equipment Utilized During Treatment  Gait belt    Activity Tolerance  Patient tolerated treatment well;Patient limited by fatigue    Behavior During Therapy  [facility] for tasks assessed/performed       Past Medical History:  Diagnosis Date  . Alcohol abuse   . APS (antiphospholipid syndrome) ([place])   . BPH (benign prostatic hyperplasia)   . CAD (coronary artery disease)   . Cellulitis   . Cervical spinal stenosis   . Chronic osteomyelitis ([place])   . CKD (chronic kidney disease)   . CKD (chronic kidney disease)   . Clostridium difficile diarrhea   . Depression   . Diplopia   . Fatigue   . GERD (gastroesophageal reflux disease)   . Hyperlipemia   . Hypertension   . Hypovitaminosis D   . IBS (irritable bowel syndrome)   . 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 Insomnia   . Left lumbosacral radiculopathy   . Moderate obstructive sleep apnea   . Osteomyelitis of foot ([place])   . PAD (peripheral artery disease) ([place])   . Pruritus   . RA (rheumatoid arthritis) ([place])   . Radiculopathy   . Restless legs syndrome   . RLS (restless legs syndrome)   . SLE (systemic lupus  erythematosus related syndrome) ([place])   . Stroke ([place])   . Toxic maculopathy of both eyes     Past Surgical History:  Procedure Laterality Date  . CARDIAC CATHETERIZATION    . CAROTID ENDARTERECTOMY    . CERVICAL LAMINECTOMY    . CORONARY ANGIOPLASTY    . FRACTURE SURGERY    . fusion C5-6-7    . HERNIA REPAIR    . KNEE ARTHROSCOPY    . TONSILLECTOMY      There were no vitals filed for this visit.  Subjective Assessment - 06/20/18 0730    Subjective  Patient is accompanied by his daughter and caregiver [name] who pharticipated in today's treatment session. Patient and [name] report that patient is feeling well today and did not have soreness following last treatment session. He is not feeling pain today although he was not as enthusiastic about attending while getting ready. Both agree that patient would  be able to increase his visit frequency to 3x per week due to good family support.  States L knee continues to feel better but has moments of soreness at medial knee joint.    Patient is accompained by:  Family member   intermittantly; she thought she could not come in   Pertinent  History  Patient is a 73 y.o. male who presents to outpatient physical therapy with a referral for medical diagnosis of CVA. This patient's chief complaints consist of left hemiplegia and overall deconditioning leading to the following functional deficits: dependent for ADLs, IADLs, unable to transfer to car, dependent transfers at home with hoyer lift, unable to walk or stand without significant assistance, difficulty with W/C navigation..    Limitations  Sitting;Lifting;Standing;House hold activities;Writing;Walking;Other (comment)   stairs, transferring   Diagnostic tests  Brain MRI 11/01/2017: IMPRESSION: 1. Acute right ACA territory infarct as demonstrated on prior CT imaging. No intracranial hemorrhage or significant mass effect. 2. 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     Patient Stated Goals  wants to be able to walk again    Currently in Pain?  No/denies    Pain Onset  Efrain Sella PT Assessment - 06/20/18 0001      Assessment   Medical Diagnosis  CVA    Referring Provider (PT)  Dr. [name], Guy Begin    Onset Date/Surgical Date  11/02/18    Hand Dominance  Right    Next MD Visit  this month    Prior Therapy  acute care PT, SNF based rehab that improved function, some HHPT. Last seen a week or so      Precautions   Precautions  None;Other (comment)    Precaution Comments  left hemiplegia    Required Braces or Orthoses  --   left leg brace has been reccomended     Restrictions   Weight Bearing Restrictions  No      Home Environment   Living Environment  Private residence    Living Arrangements  Spouse/significant other    Available Help at Discharge  Family;Personal care attendant;Available PRN/intermittently   two ladies come at about 4 daily to help; he met them at SNF   Type of [place] in [place] entrance   in back of house, door has handles on it   Home Layout  One level    Home Equipment  Wheelchair - manual;Other (comment);Hospital bed;Bedside commode;Walker - 2 wheels   hoyer lift, bedpan,    Additional Comments  haven't tried getting to the bathroom, unsure if he can get in. 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 Decreased muscle mass noted in left LE compared to R.   NEUROLOGICAL: Dermatomes: BUE intact and equal ot light touch except bilateral loss of sensation at bottom of feet. Myotomes: see strength testing below.  PERIPHERAL JOINT MOTION (AROM/PROM in degrees):  *Indicates pain Hip  Flexion: WFL  Extension: Difficulty extending bilaterally with attempts to stand, unable to fully evaluate this session.   Abduction: WFL  Adduction: WFL Knee  Flexion: R = WFL, L = pain with flexion past 125 degrees, limited by pain.   Extension: R = WFL, L = WFL PROM .  Ankle: PROM appears grossly adequate for basic ambulation.   STRENGTH:  *Indicates pain Hip         Flexion: R = 4/5, L = 1/5.  Abduction (seated): R = 3/5, L = 1/5.  Adduction (seated): R = 3/5, L = 1/5. Knee  Ext: R = 4+/5, L = 1/5. Ankle (seated position)  Dorsiflexion: R = 3/5, L = 1/5.  Plantarflexion: R = 3+/5, L = 0/5.  Eversion: R = 3+/5, L = 1/5.  Able to wiggle toes on left  FUNCTIONAL MOBILITY:  Bed mobility: unable to test due to lack of successful chair to mat transfer  Seated balance: able to sit on edge of plinth for up to 10 seconds without support, leans to R, requiring min A after that.   Transfers: Stand pivot transfer to RW from/to w/c with max A +2-3 (one blocking chair, one in front, one guiding buttocks from behind) blocking left knee.   Standing Balance: poor. strong backward lean, Mod-max A to stand 1.5 minutes at RW with left knee blocked. With +2 assist more able to stand longer  Gait: unable at this time  Stairs: unable at this time  W/C propulsion: able to propel 20 feet with min A and very slow pace, runs into objects on left without assistance.   Car transfers: unable at this time, came by transport vehicle that accomodates w/c     TREATMENT: Ace bandage dorsiflexion assist applied throughout treatment.   Therapeutic activities: for functional strengthening and improved functional activity tolerance. - Squat pivot transfer x 4 reps with max A +3 (daughter bracing w/c, second clinician assisting at buttocks): w/c <> slightly elevated plinth and w/c <> large NuStep. No AD used. R knee blocked. Pt grasping around back of clinician.  - STS transfer from/to very elevated plinth using RW and max A +2, blocking L knee with physical assist from gait belt and facilitation at glutes as able. Patient varied in ability to extend hips and knees. Most attempts he lacked about 20 degrees hip and R knee extension, but he improved with practice to more confidently come to standing. Notable for backward lean that improved with repetitions but worsened with fatigue while standing. Stood up to 2 -3 minutes seconds at a time with second person assisting with support from behind for 4 attempts. Completed STS 7 reps with rests as needed. 3 reps max A +3.  Therapeutic exercise:to centralize symptoms and improve ROM, strength, muscular endurance, and activity tolerance required for successful completion of functional activities.  - seated unsupported trunk balance strength with intermittent min A to prevent lean back or left. Intermittent cuing to improve posture with ability to improve siting posture for a few seconds at a time. Best able to maintain posture with BUE grasp around edge of plinth. - continued to practice sitting stability during seated LE exercises at edge of plinth: Seated marching AROM on R and AAROM on left alternating sides to mimic ambulation x 10 each side. Hip abduction against ball between knees x 10. Attempted kicking soccer ball with furniture slider on bottom of L foot and got min movement knee extension on R side. Attempted using mirror biofeedback with  mirror placed between legs with patient watching R leg in mirror while  attempting to extend L knee with manual facilitation at the L quad but got less activation than with soccer.  - NuStep level 1 using bilateral upper and lower extremities. Seat/handle setting 16. For improved extremity mobility, muscular endurance, and activity tolerance; stimulate improved joint nutrition, practice improved posture, and improve cross limb motion for ambulation. x 5  Minutes with close supervision to monitor falling to left off machine. Required max A assistance to get on/off and total assistance to set up machine.    PT Education - 06/20/18 0732    Education Details  Exercise purpose/form. Self management techniques. Education on diagnosis, prognosis, POC, anatomy and physiology of current condition    Person(s) Educated  Patient;Child(ren);Caregiver(s)    Methods  Explanation;Demonstration;Tactile cues;Verbal cues    Comprehension  Verbalized understanding;Returned demonstration;Verbal cues required;Tactile cues required;Need further instruction       PT Short Term Goals - 06/20/18 0801      PT SHORT TERM GOAL #1   Title  Be independent with initial home exercise program for self-management of symptoms.    Baseline  HEP to be given at second session; advice to practice unsupported sitting at home (06/19/2018)    Time  2    Period  Weeks    Status  On-going    Target Date  06/26/18        PT Long Term Goals - 06/20/18 0802      PT LONG TERM GOAL #1   Title  Patient will complete all bed mobility with min A to improve functional independence for getting in and out of bed and adjusting in bed.     Baseline  max A - total A reported by family (06/19/2018);     Time  12    Period  Weeks    Status  New    Target Date  09/11/18      PT LONG TERM GOAL #2   Title  Patient will complete sit <> stand transfer chair to chair with Min A and LRAD to improve functional independence for household and community mobility.     Baseline  max A +2 STS at W/C (06/12/2018); max A +2-3  STS or stand pivot transfer, able to complete more reps (06/19/2018);     Time  12    Period  Weeks    Status  On-going    Target Date  09/11/18      PT LONG TERM GOAL #3   Title  Patient will navigate manual w/c with min A x 100 feet to improve mobility for household and short community distances.     Baseline  required total A (06/12/2018); able to roll 20 feet with min A (06/20/2018);     Time  12    Period  Weeks    Status  New    Target Date  09/11/18      PT LONG TERM GOAL #4   Title  Patient will complete car transfers W/C to family SUV with min A using LRAD to improve his abilty to participate in community activities.     Baseline  unable to complete car transfers at all (06/12/2018); unable to complete car transfers at all (06/19/2018).    Time  12    Period  Weeks    Status  On-going    Target Date  09/11/18      PT LONG TERM GOAL #5   Title  Patient will ambulate at least 150 feet with mod I using LRAD to improve mobility for household and community distances.     Baseline  unable to take steps (06/12/2018); able to shift R leg forward and back with max A and RW at edge of plinth (06/19/2018);    Time  12    Period  Weeks    Status  On-going    Target Date  09/11/18      PT LONG TERM GOAL #6   Title  Patient will navigate 4 steps with min A and BUE support to improve his ability to access community and social participation.     Baseline  unable to complet stairs (06/12/2018); unable to complete stairs (06/19/2018);    Time  12    Period  Weeks    Status  On-going    Target Date  09/11/18           Plan - 06/20/18 0808    Clinical Impression Statement  Patient has attended 3 physical therapy treatment sessions and is making progress towards goals. Progress note / re-certification being completed today to increase frequency to 3x a week due to increased availability of treatment and patient demonstrating his ability to participate meaningfully and benefit from more frequent  visit. Patient tolerated today's treatment well and is making appropriate progress at this point. He continues to be very cooperative and daughter continues to be very helpful and engaged throughout the session, enhancing the effectiveness of clinician through offering encouragement to patient, insight into his experience, and supervising, lightly assisting pt or bracing wheelchair when another hand was needed for optimal care. Patient was able to stand for significantly increased amount of time today and treatment continued to focus on weight bearing and trunk stability activities today. He continues to show improvements in trunk stability including able to spend 5 min on NuStep with supervision instead of min A to maintain trunk control. According to [name], pt has ADHD and is very distractible, which was evident throughout the visit but improved from last session. He required cues to stay on task often. He did not have any dizziness with standing, demonstrating improvements from prior attempts at previous rehabilitation settings. Patient was appropriately fatigued following session. Patient presents with significant strength, coordination, sensation, endurance, and balance impairments that are limiting ability to complete basic ADLs, IADLs and functional mobility including bed mobility, transfers, ambulation, stairs, and wheelchair navigation without difficulty. This is limiting his ability to participate in community and social settings. Patient will benefit from skilled physical therapy intervention to address current body structure impairments and activity limitations to improve function and work towards goals set in current POC in order to return to prior level of function or maximal functional improvement.    Personal Factors and Comorbidities  Age;Comorbidity 3+    Comorbidities  Relevant past medical history and comorbidities include long term steroid use for lupus, CKD, chronic osteomyelitis, cervical  pine stenosis, BPH, APS, alcohol abuse, peripheral artery disease, depression, GERD, obstructive sleep apnea, HTN, IBS, IgA deficiency, lumbar radiculopathy, RA, Stroke, toxic maculopathy in both eyes, systemic lupus erythematosus related syndrome, cardiac catheterization, carotid endarterectomy, cervical laminectomy, coronary angioplasty, Fusion C5-C7, knee arthroplasty, lumbar surgery.     Examination-Activity Limitations  Bed Mobility;Bend;Sit;Toileting;Stand;Stairs;Lift;Transfers;Squat;Locomotion Level;Carry;Dressing;Hygiene/Grooming;Continence    Examination-Participation Restrictions  Yard Work;Interpersonal Relationship;Community Activity    Stability/Clinical Decision Making  Evolving/Moderate complexity    Rehab Potential  Fair    PT Frequency  3x / week    PT Duration  12 weeks    PT Treatment/Interventions  ADLs/Self Care Home Management;Electrical Stimulation;Moist Heat;Cryotherapy;Gait training;Stair training;Functional mobility training;Therapeutic exercise;Balance training;Neuromuscular re-education;Cognitive remediation;Patient/family education;Orthotic Fit/Training;Wheelchair mobility training;Manual techniques;Passive range of motion;Energy conservation;Joint Manipulations    PT Next Visit Plan  work on functional strength and establish HEP    PT Home Exercise Plan  will establish next session    Recommended Other Services  OT, SLP, possible left AFO    Consulted and Agree with Plan of Care  Patient;Family member/caregiver    Family Member Consulted  daugher [name]       Patient will benefit from skilled therapeutic intervention in order to improve the following deficits and impairments:  Abnormal gait, Decreased activity tolerance, Decreased cognition, Decreased endurance, Decreased knowledge of use of DME, Decreased range of motion, Decreased skin integrity, Decreased strength, Impaired perceived functional ability, Impaired sensation, Impaired UE functional use, Improper body  mechanics, Pain, Cardiopulmonary status limiting activity, Decreased balance, Decreased coordination, Decreased mobility, Difficulty walking, Impaired tone, Postural dysfunction  Visit Diagnosis: Hemiplegia and hemiparesis following cerebral infarction affecting left non-dominant side (HCC)  Muscle weakness (generalized)  Difficulty in walking, not elsewhere classified  Other lack of coordination     Problem List There are no active problems to display for this patient.   Everlean Alstrom. Graylon Good, PT, DPT 06/20/18, 8:09 AM  [facility] [address], [place], [address] Phone: [phone]   Fax:  [phone]  Name: TINA TEMME MRN: [id number] Date of Birth: Nov 05, 1945  Addendum 06/24/2018 to correct error in frequency and duration in plan of care.   Everlean Alstrom. Graylon Good, PT, DPT 06/24/18, 8:58 AM

## 2018-06-24 ENCOUNTER — Other Ambulatory Visit: Payer: Self-pay

## 2018-06-24 ENCOUNTER — Ambulatory Visit: Payer: Medicare Other | Admitting: Occupational Therapy

## 2018-06-24 ENCOUNTER — Ambulatory Visit: Payer: Medicare Other | Admitting: Physical Therapy

## 2018-06-24 ENCOUNTER — Encounter: Payer: Self-pay | Admitting: Occupational Therapy

## 2018-06-24 ENCOUNTER — Encounter: Payer: Self-pay | Admitting: Physical Therapy

## 2018-06-24 DIAGNOSIS — R278 Other lack of coordination: Secondary | ICD-10-CM

## 2018-06-24 DIAGNOSIS — I69354 Hemiplegia and hemiparesis following cerebral infarction affecting left non-dominant side: Secondary | ICD-10-CM

## 2018-06-24 DIAGNOSIS — R262 Difficulty in walking, not elsewhere classified: Secondary | ICD-10-CM

## 2018-06-24 DIAGNOSIS — M6281 Muscle weakness (generalized): Secondary | ICD-10-CM

## 2018-06-24 NOTE — Therapy (Addendum)
[place] MAIN [facility] SERVICES [address] [place], [place], [address] Phone: [phone]   Fax:  [phone]  Occupational Therapy Treatment  Patient Details  Name: Kenneth Summers MRN: [id number] Date of Birth: 1945/10/13 No data recorded  Encounter Date: 06/24/2018  OT End of Session - 06/24/18 1530    Visit Number  4    Number of Visits  24    Date for OT Re-Evaluation  09/04/18    Authorization Type  Progress report period starting 06/12/2018    OT Start Time  1436    OT Stop Time  1515    OT Time Calculation (min)  39 min    Activity Tolerance  Patient tolerated treatment well    Behavior During Therapy  [facility] for tasks assessed/performed       Past Medical History:  Diagnosis Date  . Alcohol abuse   . APS (antiphospholipid syndrome) ([place])   . BPH (benign prostatic hyperplasia)   . CAD (coronary artery disease)   . Cellulitis   . Cervical spinal stenosis   . Chronic osteomyelitis ([place])   . CKD (chronic kidney disease)   . CKD (chronic kidney disease)   . Clostridium difficile diarrhea   . Depression   . Diplopia   . Fatigue   . GERD (gastroesophageal reflux disease)   . Hyperlipemia   . Hypertension   . Hypovitaminosis D   . IBS (irritable bowel syndrome)   . 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 Insomnia   . Left lumbosacral radiculopathy   . Moderate obstructive sleep apnea   . Osteomyelitis of foot ([place])   . PAD (peripheral artery disease) ([place])   . Pruritus   . RA (rheumatoid arthritis) ([place])   . Radiculopathy   . Restless legs syndrome   . RLS (restless legs syndrome)   . SLE (systemic lupus erythematosus related syndrome) ([place])   . Stroke ([place])   . Toxic maculopathy of both eyes     Past Surgical History:  Procedure Laterality Date  . CARDIAC CATHETERIZATION    . CAROTID ENDARTERECTOMY    . CERVICAL LAMINECTOMY    . CORONARY ANGIOPLASTY    . FRACTURE SURGERY    . fusion C5-6-7    . HERNIA REPAIR    . KNEE  ARTHROSCOPY    . TONSILLECTOMY      There were no vitals filed for this visit.  Subjective Assessment - 06/24/18 1529    Subjective   Pt.'s daughter was present during treatment.    Pertinent History  Pt. is a 73 y.o. 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 Pt. PMHx includes: Multiple Falls, Lupus, DJD, Renal Abscess, CVA. Pt. resides with his wife. Pt.'s wife and daughter assist with ADLs. Pt. has caregivers in  for 2 hours a day, 6 days a week. Pt. received Rehab services in acute care, at SNF for STR, and [place] services. Pt. is retired from [employer] for [employer] and [employer].    Currently in Pain?  No/denies      OT TREATMENT    Therapeutic Exercise:  Pt. performed 2# dowel ex. For UE strengthening secondary to weakness. Bilateral shoulder flexion, chest press, circular patterns, and elbow flexion/extension were performed 1 set 10-20 reps. Pt. performed bilateral elbow flexion, and extension, with the red theraband. Pt. performed resistive EZ Board exercises for forearm supination/pronation, wrist flexion/extension using gross grasp, and lateral pinch (key) grasp. Pt. performed LUE  resistive EZ Board exercises angled in several planes to promote shoulder flexion, abduction, and wrist flexion, and extension while performing resistive wrist flexion and extension with a gross grip. Pt. performed gross gripping with grip strengthener. Pt. worked on sustaining grip while grasping pegs and reaching at various heights. The gripper was placed in the 2nd resistive slot with the white resistive spring. Pt. performed with 1 # cuff weight for 5 reps. Pt. finished clearing the board without the weight, and with the container placed directly next to the board.  Response to treatment:   Pt. required multiple rest breaks, and cues for left sided awareness.                      OT Education - 06/24/18 1530    Education Details  OT  services, UE functioning, goals, POC, grip, and pinch strength    Person(s) Educated  Patient;Child(ren)    Methods  Explanation    Comprehension  Verbalized understanding;Returned demonstration          OT Long Term Goals - 06/12/18 1701      OT LONG TERM GOAL #1   Title  Pt. will increase left shoulder flexion AROM by 10 degrees to access cabinet/shelf.    Baseline  Eval: Left shoulder flexion 70(96)    Time  12    Period  Weeks    Status  New    Target Date  09/04/18      OT LONG TERM GOAL #2   Title  Pt. will donn a shirt with Supervision.    Baseline  Eval: ModA    Time  12    Period  Weeks    Status  New    Target Date  09/02/18      OT LONG TERM GOAL #3   Title  Pt. will require ModA to perform LE dressing    Baseline  Eval: Total    Time  12    Period  Weeks    Status  New    Target Date  09/04/18      OT LONG TERM GOAL #4   Title  Pt. will improve left grip strength by 10# to be able to open a jar/container.    Baseline  Eval:  Left: 25, Right 40    Time  12    Period  Weeks    Target Date  09/04/18      OT LONG TERM GOAL #5   Title  Pt. will improve left hand [facility] skills to be able to assit with buttoning/zipping.    Baseline  Eval: Left: 2 min. & 12 sec., R: 1 min. & 8 sec.    Time  12    Period  Weeks    Status  New    Target Date  09/04/18      Long Term Additional Goals   Additional Long Term Goals  Yes      OT LONG TERM GOAL #6   Title  Pt. will demonstrate visual conmpensatory strategies during 100% of the time during self care.    Baseline  Eval: Diplopia. Unable    Time  12    Period  Weeks    Status  New    Target Date  09/04/18      OT LONG TERM GOAL #7   Title  Pt. will prepare a simple cold snack from w/c with supervision using cognitive compensatory strategies 100% of the time.  Baseline  Eval: unable    Time  12    Period  Weeks    Status  New    Target Date  09/04/18      OT LONG TERM GOAL #8   Title  Pt. will accurately  identify potential saeftey hazard using good safety awareness, and judgement 100% for ADLs, and IADLs.    Baseline  Eval: unable    Time  12    Period  Weeks    Status  New    Target Date  09/04/18            Plan - 06/24/18 1531    Clinical Impression Statement  Pt. tolerated the session well. Pt. presents with decreased neck discomfort during the session today. Pt. conitnues to present with impaired strength, decreased motor control, and limited [facility] skills hindering his ability to complete UE, and LE ADL tasks. Pt. continues to work on improving BUE strength, hand function, and coordination skills in order to increase engagement, and independence with ADLs, and IADLs.     Occupational performance deficits (Please refer to evaluation for details):  ADL's    Body Structure / Function / Physical Skills  ADL;Coordination;Balance;IADL;Pain;FMC;ROM;Tone;Proprioception;Decreased knowledge of use of DME;Vision;Strength;UE functional use    Cognitive Skills  Attention;Memory;Emotional;Problem Solve;Safety Awareness    Clinical Decision Making  Multiple treatment options, significant modification of task necessary    Comorbidities Affecting Occupational Performance:  Presence of comorbidities impacting occupational performance    Comorbidities impacting occupational performance description:  Phyical, cognitive, visual,  medical comorbidities    Modification or Assistance to Complete Evaluation   Max significant modification of tasks or assist is necessary to complete    OT Frequency  2x / week    OT Duration  12 weeks    OT Treatment/Interventions  Self-care/ADL training;DME and/or AE instruction;Therapeutic exercise;Therapeutic activities;Moist Heat;Cognitive remediation/compensation;Neuromuscular education;Visual/perceptual remediation/compensation;Coping strategies training;Patient/family education;Passive range of motion;Psychosocial skills training;Energy conservation;Functional Mobility  Training    Consulted and Agree with Plan of Care  Patient;Family member/caregiver    Family Member Consulted  Daughter [name]       Patient will benefit from skilled therapeutic intervention in order to improve the following deficits and impairments:  Cognitive Skills, Body Structure / Function / Physical Skills  Visit Diagnosis: Muscle weakness (generalized)  Other lack of coordination    Problem List There are no active problems to display for this patient.   Harrel Carina, MS, OTR/L 06/24/2018, 3:41 PM  [place] MAIN [facility] SERVICES [address] [place], [place], [address] Phone: [phone]   Fax:  [phone]  Name: WILLEY DUE MRN: [id number] Date of Birth: May 01, 1945

## 2018-06-25 NOTE — Therapy (Signed)
[place] MAIN [facility] SERVICES [address] [place], [place], [address] Phone: [phone]   Fax:  [phone]  Physical Therapy Treatment  Patient Details  Name: Kenneth Summers MRN: [id number] Date of Birth: 08/25/1945 Referring Provider (PT): Dr. [name], Guy Begin   Encounter Date: 06/24/2018  PT End of Session - 06/25/18 0808    Visit Number  4    Number of Visits  24    Date for PT Re-Evaluation  09/11/18    Authorization Type  Medicare reporting period from 06/12/2018    Authorization Time Period  Current cert period 09/15/9831 - 09/11/2018    PT Start Time  1325    PT Stop Time  1432    PT Time Calculation (min)  67 min    Equipment Utilized During Treatment  Gait belt    Activity Tolerance  Patient tolerated treatment well;Patient limited by fatigue    Behavior During Therapy  [facility] for tasks assessed/performed       Past Medical History:  Diagnosis Date  . Alcohol abuse   . APS (antiphospholipid syndrome) ([place])   . BPH (benign prostatic hyperplasia)   . CAD (coronary artery disease)   . Cellulitis   . Cervical spinal stenosis   . Chronic osteomyelitis ([place])   . CKD (chronic kidney disease)   . CKD (chronic kidney disease)   . Clostridium difficile diarrhea   . Depression   . Diplopia   . Fatigue   . GERD (gastroesophageal reflux disease)   . Hyperlipemia   . Hypertension   . Hypovitaminosis D   . IBS (irritable bowel syndrome)   . IgA deficiency ([place])   . Insomnia   . Left lumbosacral radiculopathy   . Moderate obstructive sleep apnea   . Osteomyelitis of foot ([place])   . PAD (peripheral artery disease) ([place])   . Pruritus   . RA (rheumatoid arthritis) ([place])   . Radiculopathy   . Restless legs syndrome   . RLS (restless legs syndrome)   . SLE (systemic lupus erythematosus related syndrome) ([place])   . Stroke ([place])   . Toxic maculopathy of both eyes     Past Surgical History:  Procedure Laterality Date  .  CARDIAC CATHETERIZATION    . CAROTID ENDARTERECTOMY    . CERVICAL LAMINECTOMY    . CORONARY ANGIOPLASTY    . FRACTURE SURGERY    . fusion C5-6-7    . HERNIA REPAIR    . KNEE ARTHROSCOPY    . TONSILLECTOMY      There were no vitals filed for this visit.  Subjective Assessment - 06/25/18 0806    Subjective  Patient reports doing well; He reports feeling some fatigue which is likely related to wet cooler weather; Patient denies any new falls or changes;     Patient is accompained by:  Family member   intermittantly; she thought she could not come in   Pertinent History  Patient is a 73 y.o. male who presents to outpatient physical therapy with a referral for medical diagnosis of CVA. This patient's chief complaints consist of left hemiplegia and overall deconditioning leading to the following functional deficits: dependent for ADLs, IADLs, unable to transfer to car, dependent transfers at home with hoyer lift, unable to walk or stand without significant assistance, difficulty with W/C navigation..    Limitations  Sitting;Lifting;Standing;House hold activities;Writing;Walking;Other (comment)   stairs, transferring   Diagnostic tests  Brain MRI 11/01/2017: IMPRESSION: 1. Acute right ACA territory infarct  as demonstrated on prior CT imaging. No intracranial hemorrhage or significant mass effect. 2. 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    Patient Stated Goals  wants to be able to walk again    Currently in Pain?  No/denies    Pain Onset  Yesterday          TREATMENT: Patient presents to therapy in wheelchair with his daughter to assist with mobility training;  Patient transferred wheelchair<>Nustep: Attempted squat pivot transfer, max A +2 but unable to complete transfer; Progressed to slideboard transfer with max A +2-3 for equipment management. Patient required mod VCs for hand placement and positioning;   Exercise: Nustep BUE/BLE level 2 x6 min  with verbal and visual cues to improve erect posture as he often leans left/right with verbal cues to increase speed for cardiovascular endurance. Patient able to achieve 45 steps per minute during exercise. He reports some fatigue after exercise but denies any pain;   Following Nustep, patient transferred slide board Nustep to wheelchair with max A +2 requiring max A for board placement and mod VCs for hand placement and positioning; Patient able to reach with RUE to wheelchair to assist with pulling self to chair but has difficulty initiating LE movement;  Patient transferred slide board to mat table with max A +2 with mod VCs for hand placement to try and push self with RUE from wheelchair to assess transfers from each direction. He did have increased difficulty transferring towards weaker side requiring increased assistance and time to transfer to mat table;   Upon sitting on mat table, patient requires 2 HHA to hold self up into erect posture as he often falls side/side due to poor core and trunk control and weakness. Patient sat edge of mat table for approximately 5 min and reports increased back pain requesting to lay down due to weakness and fatigue;   Patient required max A +2 for sit to supine transition with max A for positioning with pillow.  Exercise: Instructed patient in bridges, he requires therapist assist to hold LLE into hookyling position due to poor hip adductor control x10 reps with partial ROM; Patient denies any back pain during exercise. Reports that he has been doing these exercises at home with partial ROM.  Patient instructed in hip adduction/abduction AROM with LLE requiring AAROM due to weakness in hip adduction being unable to fully bring hip into neutral position in hooklying;  Following exercise, patient required max A +2 for transitioning supine to sidelying to sitting with mod VCs for LE/UE positioning; He had significant difficulty pushing up with LUE due to  weakness;   Exercise: In sitting, instructed patient in diaphragmatic breathing with arms across chest, to facilitate thoracic extension and anterior pelvic tilt x5 reps with min A for balance; instructed patient and caregiver in proper positioning while in wheelchair for better exercise technique to facilitate upper trunk control and abdominal stabilization; Patient and caregiver verbalized understanding;  In sitting, instructed patient in scapular retraction x5 reps with cues to keep arms by side and to improve erect posture with anterior pelvic tilt and thoracic extension while avoiding shoulder elevation for better scapular retraction; Patient able to exhibit good scapular control but does require min A for maintaining erect posture as he often falls to side due to poor trunk control;   Following exercise patient transferred mat table to wheelchair with slide board, max A +3 for equipment management with mod VCs for hand placement; Patient had difficulty  with transfer at end of session due to fatigue.    Response to treatment: Patient required mod VCs for proper positioning including hand placement and LE positioning prior to, during and following transfers. He requires max A +2-3 for equipment and self management during transfers due to extensive weakness and poor trunk control; Patient verbalized and demonstrated understanding with seated exercise for postural strengthening. However follow up may be necessary to ensure correct positioning as patient often distracted and has difficulty maintaining erect posture. Patient reports moderate fatigue at end of session without increase in pain; Current plan remains appropriate.                        PT Education - 06/25/18 0808    Education Details  positioning, transfers, HEP    Person(s) Educated  Patient;Child(ren)    Methods  Explanation;Demonstration;Verbal cues    Comprehension  Verbalized understanding;Returned  demonstration;Verbal cues required;Need further instruction       PT Short Term Goals - 06/20/18 0801      PT SHORT TERM GOAL #1   Title  Be independent with initial home exercise program for self-management of symptoms.    Baseline  HEP to be given at second session; advice to practice unsupported sitting at home (06/19/2018)    Time  2    Period  Weeks    Status  On-going    Target Date  06/26/18        PT Long Term Goals - 06/20/18 0802      PT LONG TERM GOAL #1   Title  Patient will complete all bed mobility with min A to improve functional independence for getting in and out of bed and adjusting in bed.     Baseline  max A - total A reported by family (06/19/2018);     Time  12    Period  Weeks    Status  New    Target Date  09/11/18      PT LONG TERM GOAL #2   Title  Patient will complete sit <> stand transfer chair to chair with Min A and LRAD to improve functional independence for household and community mobility.     Baseline  max A +2 STS at W/C (06/12/2018); max A +2-3 STS or stand pivot transfer, able to complete more reps (06/19/2018);     Time  12    Period  Weeks    Status  On-going    Target Date  09/11/18      PT LONG TERM GOAL #3   Title  Patient will navigate manual w/c with min A x 100 feet to improve mobility for household and short community distances.     Baseline  required total A (06/12/2018); able to roll 20 feet with min A (06/20/2018);     Time  12    Period  Weeks    Status  New    Target Date  09/11/18      PT LONG TERM GOAL #4   Title  Patient will complete car transfers W/C to family SUV with min A using LRAD to improve his abilty to participate in community activities.     Baseline  unable to complete car transfers at all (06/12/2018); unable to complete car transfers at all (06/19/2018).    Time  12    Period  Weeks    Status  On-going    Target Date  09/11/18  PT LONG TERM GOAL #5   Title  Patient will ambulate at least 150 feet with mod  I using LRAD to improve mobility for household and community distances.     Baseline  unable to take steps (06/12/2018); able to shift R leg forward and back with max A and RW at edge of plinth (06/19/2018);    Time  12    Period  Weeks    Status  On-going    Target Date  09/11/18      PT LONG TERM GOAL #6   Title  Patient will navigate 4 steps with min A and BUE support to improve his ability to access community and social participation.     Baseline  unable to complet stairs (06/12/2018); unable to complete stairs (06/19/2018);    Time  12    Period  Weeks    Status  On-going    Target Date  09/11/18            Plan - 06/25/18 0809    Clinical Impression Statement  Patient educated on slideboard transfers. He continutes to require max A +2 for slideboard transfers; Patient has difficulty initiating forward weight shift. patient re-educated on postural stabilization to improve erect sitting posture. he does exhibit significant weakness in back and trunk which affects overall mobility including bed mobility, transfers and sitting balance. Patient instructed in seated trunk exercise to improve postural control. He would benefit from additional skilled PT intervention to improve strength, balance and gait safety;     Personal Factors and Comorbidities  Age;Comorbidity 3+    Comorbidities  Relevant past medical history and comorbidities include long term steroid use for lupus, CKD, chronic osteomyelitis, cervical pine stenosis, BPH, APS, alcohol abuse, peripheral artery disease, depression, GERD, obstructive sleep apnea, HTN, IBS, IgA deficiency, lumbar radiculopathy, RA, Stroke, toxic maculopathy in both eyes, systemic lupus erythematosus related syndrome, cardiac catheterization, carotid endarterectomy, cervical laminectomy, coronary angioplasty, Fusion C5-C7, knee arthroplasty, lumbar surgery.     Examination-Activity Limitations  Bed  Mobility;Bend;Sit;Toileting;Stand;Stairs;Lift;Transfers;Squat;Locomotion Level;Carry;Dressing;Hygiene/Grooming;Continence    Examination-Participation Restrictions  Yard Work;Interpersonal Relationship;Community Activity    Stability/Clinical Decision Making  Evolving/Moderate complexity    Rehab Potential  Fair    PT Frequency  3x / week    PT Duration  12 weeks    PT Treatment/Interventions  ADLs/Self Care Home Management;Electrical Stimulation;Moist Heat;Cryotherapy;Gait training;Stair training;Functional mobility training;Therapeutic exercise;Balance training;Neuromuscular re-education;Cognitive remediation;Patient/family education;Orthotic Fit/Training;Wheelchair mobility training;Manual techniques;Passive range of motion;Energy conservation;Joint Manipulations    PT Next Visit Plan  work on functional strength and establish HEP    PT Home Exercise Plan  will establish next session    Consulted and Agree with Plan of Care  Patient;Family member/caregiver    Family Member Consulted  daugher [name]       Patient will benefit from skilled therapeutic intervention in order to improve the following deficits and impairments:  Abnormal gait, Decreased activity tolerance, Decreased cognition, Decreased endurance, Decreased knowledge of use of DME, Decreased range of motion, Decreased skin integrity, Decreased strength, Impaired perceived functional ability, Impaired sensation, Impaired UE functional use, Improper body mechanics, Pain, Cardiopulmonary status limiting activity, Decreased balance, Decreased coordination, Decreased mobility, Difficulty walking, Impaired tone, Postural dysfunction  Visit Diagnosis: Muscle weakness (generalized)  Other lack of coordination  Hemiplegia and hemiparesis following cerebral infarction affecting left non-dominant side (HCC)  Difficulty in walking, not elsewhere classified     Problem List There are no active problems to display for this  patient.   Rowin Bayron PT, DPT  06/25/2018, 8:22 AM  [place] MAIN [facility] SERVICES [address] [place], [place], [address] Phone: [phone]   Fax:  [phone]  Name: Kenneth Summers MRN: [id number] Date of Birth: Jan 24, 1946

## 2018-06-26 ENCOUNTER — Ambulatory Visit: Payer: Medicare Other | Admitting: Occupational Therapy

## 2018-06-26 ENCOUNTER — Ambulatory Visit: Payer: Medicare Other | Admitting: Physical Therapy

## 2018-06-26 ENCOUNTER — Other Ambulatory Visit: Payer: Self-pay

## 2018-06-26 ENCOUNTER — Encounter: Payer: Self-pay | Admitting: Occupational Therapy

## 2018-06-26 DIAGNOSIS — R262 Difficulty in walking, not elsewhere classified: Secondary | ICD-10-CM

## 2018-06-26 DIAGNOSIS — M6281 Muscle weakness (generalized): Secondary | ICD-10-CM

## 2018-06-26 DIAGNOSIS — R278 Other lack of coordination: Secondary | ICD-10-CM

## 2018-06-26 DIAGNOSIS — I69354 Hemiplegia and hemiparesis following cerebral infarction affecting left non-dominant side: Secondary | ICD-10-CM | POA: Diagnosis not present

## 2018-06-26 NOTE — Therapy (Addendum)
[place] MAIN [facility] SERVICES [address] [place], [place], [address] Phone: [phone]   Fax:  [phone]  Occupational Therapy Treatment  Patient Details  Name: Kenneth Summers MRN: [id number] Date of Birth: December 15, 1945 No data recorded  Encounter Date: 06/26/2018  OT End of Session - 06/26/18 1740    Visit Number  5    Number of Visits  24    Date for OT Re-Evaluation  09/18/18    Authorization Type  Progress report period starting 06/12/2018    OT Start Time  1430    OT Stop Time  1515    OT Time Calculation (min)  45 min    Activity Tolerance  Patient tolerated treatment well    Behavior During Therapy  [facility] for tasks assessed/performed       Past Medical History:  Diagnosis Date  . Alcohol abuse   . APS (antiphospholipid syndrome) ([place])   . BPH (benign prostatic hyperplasia)   . CAD (coronary artery disease)   . Cellulitis   . Cervical spinal stenosis   . Chronic osteomyelitis ([place])   . CKD (chronic kidney disease)   . CKD (chronic kidney disease)   . Clostridium difficile diarrhea   . Depression   . Diplopia   . Fatigue   . GERD (gastroesophageal reflux disease)   . Hyperlipemia   . Hypertension   . Hypovitaminosis D   . IBS (irritable bowel syndrome)   . 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 Insomnia   . Left lumbosacral radiculopathy   . Moderate obstructive sleep apnea   . Osteomyelitis of foot ([place])   . PAD (peripheral artery disease) ([place])   . Pruritus   . RA (rheumatoid arthritis) ([place])   . Radiculopathy   . Restless legs syndrome   . RLS (restless legs syndrome)   . SLE (systemic lupus erythematosus related syndrome) ([place])   . Stroke ([place])   . Toxic maculopathy of both eyes     Past Surgical History:  Procedure Laterality Date  . CARDIAC CATHETERIZATION    . CAROTID ENDARTERECTOMY    . CERVICAL LAMINECTOMY    . CORONARY ANGIOPLASTY    . FRACTURE SURGERY    . fusion C5-6-7    . HERNIA REPAIR    . KNEE  ARTHROSCOPY    . TONSILLECTOMY      There were no vitals filed for this visit.  Subjective Assessment - 06/26/18 1739    Subjective   Pt.'s daughter, and wife were present during the session    Pertinent History  Pt. is a 73 y.o. 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 Pt. PMHx includes: Multiple Falls, Lupus, DJD, Renal Abscess, CVA. Pt. resides with his wife. Pt.'s wife and daughter assist with ADLs. Pt. has caregivers in  for 2 hours a day, 6 days a week. Pt. received Rehab services in acute care, at SNF for STR, and [place] services. Pt. is retired from [employer] for [employer] and [employer].    Patient Stated Goals  To regain use of his left UE, and do more for himself.    Currently in Pain?  No/denies       OT TREATMENT    Therapeutic Exercise:  Pt. performed 2# dowel ex. For UE strengthening secondary to weakness. Bilateral shoulder flexion, chest press, circular patterns, and 3#elbow flexion/extension were performed 1 set 10-20 reps. Pt. performed bilateral  Horizontal shoulder abduction, shoulder flexion, elbow  flexion, and extension, with the red theraband. A HEP was provided through [place]. Pt. performed gross gripping with grip strengthener. Pt. worked on sustaining grip while grasping pegs and reaching at various heights. The gripper was placed in the 2nd resistive slot with the white resistive spring. Pt. performed with 1 # cuff weight for 10 reps. Pt. finished clearing the board without the weight leaning forward to reach and place the pegs into the container. Pt. Worked on pinch strengthening in the left hand for lateral, and 3pt. pinch using yellow, red, green, and blue resistive clips. Pt. worked on placing the clips at various vertical and horizontal angles. Visual cues were required, as well as Tactile and verbal cues were required for eliciting the desired movement.  Response to treatment:   Improved attention, and improved  left sided awareness. Pt. was able to tolerate increased weights, with less cuing required today.                      OT Education - 06/26/18 1740    Education Details  OT services, UE functioning, goals, POC, grip, and pinch strength    Person(s) Educated  Patient;Child(ren)    Methods  Explanation    Comprehension  Verbalized understanding;Returned demonstration          OT Long Term Goals - 06/12/18 1701      OT LONG TERM GOAL #1   Title  Pt. will increase left shoulder flexion AROM by 10 degrees to access cabinet/shelf.    Baseline  Eval: Left shoulder flexion 70(96)    Time  12    Period  Weeks    Status  New    Target Date  09/18/18      OT LONG TERM GOAL #2   Title  Pt. will donn a shirt with Supervision.    Baseline  Eval: ModA    Time  12    Period  Weeks    Status  New    Target Date  09/18/18      OT LONG TERM GOAL #3   Title  Pt. will require ModA to perform LE dressing    Baseline  Eval: Total    Time  12    Period  Weeks    Status  New    Target Date  09/18/18      OT LONG TERM GOAL #4   Title  Pt. will improve left grip strength by 10# to be able to open a jar/container.    Baseline  Eval:  Left: 25, Right 40    Time  12    Period  Weeks    Target Date  09/18/18      OT LONG TERM GOAL #5   Title  Pt. will improve left hand [facility] skills to be able to assit with buttoning/zipping.    Baseline  Eval: Left: 2 min. & 12 sec., R: 1 min. & 8 sec.    Time  12    Period  Weeks    Status  New    Target Date  09/18/18      Long Term Additional Goals   Additional Long Term Goals  Yes      OT LONG TERM GOAL #6   Title  Pt. will demonstrate visual conmpensatory strategies during 100% of the time during self care.    Baseline  Eval: Diplopia. Unable    Time  12    Period  Weeks  Status  New    Target Date  09/18/18      OT LONG TERM GOAL #7   Title  Pt. will prepare a simple cold snack from w/c with supervision using cognitive  compensatory strategies 100% of the time.    Baseline  Eval: unable    Time  12    Period  Weeks    Status  New    Target Date  09/18/18      OT LONG TERM GOAL #8   Title  Pt. will accurately identify potential saeftey hazard using good safety awareness, and judgement 100% for ADLs, and IADLs.    Baseline  Eval: unable    Time  12    Period  Weeks    Status  New    Target Date  09/18/18            Plan - 06/26/18 1740    Clinical Impression Statement Pt. presented with improved attention, and Left sided awareness today. Pt. has increased activity tolerance, and tolerance for treatment. Pt. would benefit from increasing frequency of treatment form 2x's a week to 3x's a week to address and improve LUE functioning, and ADL goals. Pt. continues to present with limited left UE strength, motor control, and [facility] skills which continue to hinder his ability to perform daily ADL tasks.  WIth cuing, the pt. is able to engage his LUE, and hand more duirng tasks. Pt.'s daughter, and wife were present at the session.  Continue with OT skilled services with increased frequency of treatment as pt. is making progress.    Occupational performance deficits (Please refer to evaluation for details):  ADL's    Body Structure / Function / Physical Skills  ADL;Coordination;Balance;IADL;Pain;FMC;ROM;Tone;Proprioception;Decreased knowledge of use of DME;Vision;Strength;UE functional use    Cognitive Skills  Attention;Memory;Emotional;Problem Solve;Safety Awareness    Clinical Decision Making  Multiple treatment options, significant modification of task necessary    Comorbidities Affecting Occupational Performance:  Presence of comorbidities impacting occupational performance    Comorbidities impacting occupational performance description:  Phyical, cognitive, visual,  medical comorbidities    Modification or Assistance to Complete Evaluation   Max significant modification of tasks or assist is necessary to  complete    OT Frequency  3x / week    OT Duration  12 weeks    OT Treatment/Interventions  Self-care/ADL training;DME and/or AE instruction;Therapeutic exercise;Therapeutic activities;Moist Heat;Cognitive remediation/compensation;Neuromuscular education;Visual/perceptual remediation/compensation;Coping strategies training;Patient/family education;Passive range of motion;Psychosocial skills training;Energy conservation;Functional Mobility Training    Consulted and Agree with Plan of Care  Patient;Family member/caregiver    Family Member Consulted  Daughter [name]       Patient will benefit from skilled therapeutic intervention in order to improve the following deficits and impairments:  Cognitive Skills, Body Structure / Function / Physical Skills  Visit Diagnosis: Muscle weakness (generalized)  Other lack of coordination    Problem List There are no active problems to display for this patient.   Harrel Carina, MS, OTR/L 06/27/2018, 10:58 AM  [place] MAIN [facility] SERVICES [address] [place], [place], [address] Phone: [phone]   Fax:  [phone]  Name: NARCISO STOUTENBURG MRN: [id number] Date of Birth: 1946/01/28

## 2018-06-27 ENCOUNTER — Encounter: Payer: Self-pay | Admitting: Physical Therapy

## 2018-06-27 ENCOUNTER — Ambulatory Visit: Payer: Medicare Other | Admitting: Physical Therapy

## 2018-06-27 ENCOUNTER — Other Ambulatory Visit: Payer: Self-pay

## 2018-06-27 DIAGNOSIS — R278 Other lack of coordination: Secondary | ICD-10-CM

## 2018-06-27 DIAGNOSIS — I69354 Hemiplegia and hemiparesis following cerebral infarction affecting left non-dominant side: Secondary | ICD-10-CM | POA: Diagnosis not present

## 2018-06-27 DIAGNOSIS — M6281 Muscle weakness (generalized): Secondary | ICD-10-CM

## 2018-06-27 DIAGNOSIS — R262 Difficulty in walking, not elsewhere classified: Secondary | ICD-10-CM

## 2018-06-27 NOTE — Addendum Note (Signed)
Addended by: Lucia Bitter on: 06/27/2018 11:08 AM   Modules accepted: Orders

## 2018-06-27 NOTE — Therapy (Signed)
[place] MAIN [facility] SERVICES [address] [place], [place], [address] Phone: [phone]   Fax:  [phone]  Physical Therapy Treatment  Patient Details  Name: Kenneth Summers MRN: [id number] Date of Birth: 11-30-1945 Referring Provider (PT): Dr. [name], Guy Begin   Encounter Date: 06/26/2018  PT End of Session - 06/27/18 0742    Visit Number  5    Number of Visits  24    Date for PT Re-Evaluation  09/11/18    Authorization Type  Medicare reporting period from 06/12/2018    Authorization Time Period  Current cert period 7/51/0258 - 09/11/2018    PT Start Time  1332    PT Stop Time  1425    PT Time Calculation (min)  53 min    Equipment Utilized During Treatment  Gait belt    Activity Tolerance  Patient tolerated treatment well;Patient limited by fatigue    Behavior During Therapy  [facility] for tasks assessed/performed       Past Medical History:  Diagnosis Date  . Alcohol abuse   . APS (antiphospholipid syndrome) ([place])   . BPH (benign prostatic hyperplasia)   . CAD (coronary artery disease)   . Cellulitis   . Cervical spinal stenosis   . Chronic osteomyelitis ([place])   . CKD (chronic kidney disease)   . CKD (chronic kidney disease)   . Clostridium difficile diarrhea   . Depression   . Diplopia   . Fatigue   . GERD (gastroesophageal reflux disease)   . Hyperlipemia   . Hypertension   . Hypovitaminosis D   . IBS (irritable bowel syndrome)   . IgA deficiency ([place])   . Insomnia   . Left lumbosacral radiculopathy   . Moderate obstructive sleep apnea   . Osteomyelitis of foot ([place])   . PAD (peripheral artery disease) ([place])   . Pruritus   . RA (rheumatoid arthritis) ([place])   . Radiculopathy   . Restless legs syndrome   . RLS (restless legs syndrome)   . SLE (systemic lupus erythematosus related syndrome) ([place])   . Stroke ([place])   . Toxic maculopathy of both eyes     Past Surgical History:  Procedure Laterality Date  .  CARDIAC CATHETERIZATION    . CAROTID ENDARTERECTOMY    . CERVICAL LAMINECTOMY    . CORONARY ANGIOPLASTY    . FRACTURE SURGERY    . fusion C5-6-7    . HERNIA REPAIR    . KNEE ARTHROSCOPY    . TONSILLECTOMY      There were no vitals filed for this visit.  Subjective Assessment - 06/27/18 0741    Subjective  Patient reports doing well; Denies any new pain. Reports no new changes.     Patient is accompained by:  Family member   intermittantly; she thought she could not come in   Pertinent History  Patient is a 73 y.o. male who presents to outpatient physical therapy with a referral for medical diagnosis of CVA. This patient's chief complaints consist of left hemiplegia and overall deconditioning leading to the following functional deficits: dependent for ADLs, IADLs, unable to transfer to car, dependent transfers at home with hoyer lift, unable to walk or stand without significant assistance, difficulty with W/C navigation..    Limitations  Sitting;Lifting;Standing;House hold activities;Writing;Walking;Other (comment)   stairs, transferring   Diagnostic tests  Brain MRI 11/01/2017: IMPRESSION: 1. Acute right ACA territory infarct as demonstrated on prior CT imaging. No intracranial hemorrhage or significant mass  effect. 2. 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    Patient Stated Goals  wants to be able to walk again    Currently in Pain?  No/denies    Pain Onset  Yesterday         TREATMENT: Therapeutic Activity: Patient transferred wheelchair to mat table, using sliding board, max A +2 for set up and transfer. Patient able to reach with RUE to assist with transfer but has difficulty with trunk control. Required mod VCs for positioning and correct transfer technique;  Following seated exercise, transitioned sit to supine with max A +2 for lifting LE into bed and controlling trunk position to sidelying and rolling to supine. Patient requires mod VCs  for correct positioning and technique, specifically for hand placement and trunk position to reduce back discomfort.   Following supine exercise, assisted patient in rolling to left side to prepare to sit edge of mat. He is max A +2 with cues to reach with RUE to pull to left side. Patient then required max A +2 for transition sidelying to sitting with assistance to slide legs off mat table and to push through LUE to raise trunk to sitting; Patient reports increased difficulty with sidelying to sitting, especially due to weakness in left side;  At end of session, transferred patient back mat table to wheelchair, leading with strong side (R side). Max A +2 for positioning and during transfer requiring therapist assist to block LE for better positioning; Patient able to reach with RUE to pull to wheelchair but does have significant weakness in trunk limiting slide board transfer;  Exercise: PT re-educated patient in HEP providing written HEP for better adherence. Seated in wheelchair, instructed patient to scoot forward so that he is not leaning against back of chair: -arms across chest, diaphragmatic breathing with mod VCs for lumbopelvic anterior tilt and upper back extension for proper sitting posture x5 reps; Required mod VCs and visual cues for correct exercise technique; Able to initiate anterior pelvic tilt well, but has difficulty maintaining position, often falling back into posterior tilt.  -Scapular retraction x5 reps with mod VCS to increase scapular retraction while reducing should elevation for proper positioning;  Sitting on mat table: Re-educated patient in proper posture. Patient able to sit edge of mat with hands in lab or beside him on mat table, unsupported, supervision exhibiting good erect posture with minimal to no lateral trunk lean. This is improvement from last session; -Instructed patient in scapular retraction low rows red tband BUE 2x10 with min VCs for proper positioning and  to increase retraction for better scapular strengthening;  Patient Hooklying: Instructed patient in bridges with mod VCs to hold LLE into hooklying position prior to lifting and to avoid excessive ROM in order to keep LE into hooklying position. Educated caregiver to avoid excessive blocking of LLE in order to ensure proper muscle activation during bridge, x10 reps; Following exercise, patient able to hold legs into hooklying position independently without LLE falling into hip abduction/ER  Legs over bolster, SAQ x10 bilaterally, alteranate LE with cues to increase quad activation for proper muscle strengthening; Patient required AAROM on LLE for full ROM but was able to initiate quad activation well; Progressed to 2# SAQ on RLE with good quad control;  Supine: Quad set 5 sec hold x5 reps each LE with instruction to hold quad set for full 5 sec to facilitate better muscle control and stabilization;  Response to treatment: Patient able to exhibit better  postural control and muscle activation during today's session. He followed verbal cues well with proper positioning and muscle control. Patient does fatigue in LLE quickly. Educated patient and caregivers to allow proper rest between exercise to avoid neuro fatigue. Patient denies any increase in pain with advanced exercise. Able to progress strength training with increased exercise today as patient able to tolerate increased activity and exhibits better muscle activation;                       PT Education - 06/27/18 0741    Education Details  positioning, transfers, HEP advanced, see patient instructions    Person(s) Educated  Patient;Spouse;Child(ren)    Methods  Explanation;Demonstration;Verbal cues;Handout    Comprehension  Verbalized understanding;Returned demonstration;Verbal cues required;Need further instruction       PT Short Term Goals - 06/20/18 0801      PT SHORT TERM GOAL #1   Title  Be independent with initial  home exercise program for self-management of symptoms.    Baseline  HEP to be given at second session; advice to practice unsupported sitting at home (06/19/2018)    Time  2    Period  Weeks    Status  On-going    Target Date  06/26/18        PT Long Term Goals - 06/20/18 0802      PT LONG TERM GOAL #1   Title  Patient will complete all bed mobility with min A to improve functional independence for getting in and out of bed and adjusting in bed.     Baseline  max A - total A reported by family (06/19/2018);     Time  12    Period  Weeks    Status  New    Target Date  09/11/18      PT LONG TERM GOAL #2   Title  Patient will complete sit <> stand transfer chair to chair with Min A and LRAD to improve functional independence for household and community mobility.     Baseline  max A +2 STS at W/C (06/12/2018); max A +2-3 STS or stand pivot transfer, able to complete more reps (06/19/2018);     Time  12    Period  Weeks    Status  On-going    Target Date  09/11/18      PT LONG TERM GOAL #3   Title  Patient will navigate manual w/c with min A x 100 feet to improve mobility for household and short community distances.     Baseline  required total A (06/12/2018); able to roll 20 feet with min A (06/20/2018);     Time  12    Period  Weeks    Status  New    Target Date  09/11/18      PT LONG TERM GOAL #4   Title  Patient will complete car transfers W/C to family SUV with min A using LRAD to improve his abilty to participate in community activities.     Baseline  unable to complete car transfers at all (06/12/2018); unable to complete car transfers at all (06/19/2018).    Time  12    Period  Weeks    Status  On-going    Target Date  09/11/18      PT LONG TERM GOAL #5   Title  Patient will ambulate at least 150 feet with mod I using LRAD to improve mobility for household and community distances.  Baseline  unable to take steps (06/12/2018); able to shift R leg forward and back with max A and  RW at edge of plinth (06/19/2018);    Time  12    Period  Weeks    Status  On-going    Target Date  09/11/18      PT LONG TERM GOAL #6   Title  Patient will navigate 4 steps with min A and BUE support to improve his ability to access community and social participation.     Baseline  unable to complet stairs (06/12/2018); unable to complete stairs (06/19/2018);    Time  12    Period  Weeks    Status  On-going    Target Date  09/11/18            Plan - 06/27/18 2778    Clinical Impression Statement  Patient motivated and tolerated session well. He was able to exhibit better sitting balance requiring fewer cues and less assistance when sitting edge of mat table. Patient instructed in advanced HEP for LE strengthening and back strengthening. He does require increased cues for correct muscle activation and positioning. Caregivers educated on proper exercise technique. Patient tolerated session well, reporting no pain at end of session. He would benefit from additional skilled PT Intervention to improve strength, balance and gait safety;     Personal Factors and Comorbidities  Age;Comorbidity 3+    Comorbidities  Relevant past medical history and comorbidities include long term steroid use for lupus, CKD, chronic osteomyelitis, cervical pine stenosis, BPH, APS, alcohol abuse, peripheral artery disease, depression, GERD, obstructive sleep apnea, HTN, IBS, IgA deficiency, lumbar radiculopathy, RA, Stroke, toxic maculopathy in both eyes, systemic lupus erythematosus related syndrome, cardiac catheterization, carotid endarterectomy, cervical laminectomy, coronary angioplasty, Fusion C5-C7, knee arthroplasty, lumbar surgery.     Examination-Activity Limitations  Bed Mobility;Bend;Sit;Toileting;Stand;Stairs;Lift;Transfers;Squat;Locomotion Level;Carry;Dressing;Hygiene/Grooming;Continence    Examination-Participation Restrictions  Yard Work;Interpersonal Relationship;Community Activity    Stability/Clinical  Decision Making  Evolving/Moderate complexity    Rehab Potential  Fair    PT Frequency  3x / week    PT Duration  12 weeks    PT Treatment/Interventions  ADLs/Self Care Home Management;Electrical Stimulation;Moist Heat;Cryotherapy;Gait training;Stair training;Functional mobility training;Therapeutic exercise;Balance training;Neuromuscular re-education;Cognitive remediation;Patient/family education;Orthotic Fit/Training;Wheelchair mobility training;Manual techniques;Passive range of motion;Energy conservation;Joint Manipulations    PT Next Visit Plan  work on functional strength and establish HEP    PT Home Exercise Plan  will establish next session    Consulted and Agree with Plan of Care  Patient;Family member/caregiver    Family Member Consulted  daugher [name]       Patient will benefit from skilled therapeutic intervention in order to improve the following deficits and impairments:  Abnormal gait, Decreased activity tolerance, Decreased cognition, Decreased endurance, Decreased knowledge of use of DME, Decreased range of motion, Decreased skin integrity, Decreased strength, Impaired perceived functional ability, Impaired sensation, Impaired UE functional use, Improper body mechanics, Pain, Cardiopulmonary status limiting activity, Decreased balance, Decreased coordination, Decreased mobility, Difficulty walking, Impaired tone, Postural dysfunction  Visit Diagnosis: Muscle weakness (generalized)  Other lack of coordination  Difficulty in walking, not elsewhere classified     Problem List There are no active problems to display for this patient.   Leshawn Houseworth,MargaretPT, DPT 06/27/2018, 7:44 AM  [place] MAIN [facility] SERVICES [address] [place], [place], [address] Phone: [phone]   Fax:  [phone]  Name: Kenneth Summers MRN: [id number] Date of Birth: 09-28-1945

## 2018-06-27 NOTE — Patient Instructions (Signed)
Access Code: [email or web address]  URL: [email or web address]  Date: 06/26/2018  Prepared by: Blanche East    Exercises Seated Anterior Pelvic Tilt- 5 reps- 2 sets- 3x daily- 7x weekly  Seated Scapular Retraction- 10 reps- 2 sets- 3x daily- 7x weekly  Supine Short Arc Quad- 10 reps- 2 sets- 3 hold- 1x daily- 7x weekly  Long Sitting Quad Set- 10 reps- 2 sets- 5 hold- 1x daily- 7x weekly  Supine Bridge- 10 reps- 2 sets- 1x daily- 7x weekly

## 2018-06-27 NOTE — Therapy (Signed)
[place] MAIN [facility] SERVICES [address] [place], [place], [address] Phone: [phone]   Fax:  [phone]  Physical Therapy Treatment  Patient Details  Name: Kenneth Summers MRN: [id number] Date of Birth: [birth year] Referring Provider (PT): Dr. [name], Guy Begin   Encounter Date: 06/27/2018  PT End of Session - 06/27/18 1028    Visit Number  6    Number of Visits  24    Date for PT Re-Evaluation  09/11/18    Authorization Type  Medicare reporting period from 06/12/2018    Authorization Time Period  Current cert period 7/78/2423 - 09/11/2018    PT Start Time  1028    PT Stop Time  1115    PT Time Calculation (min)  47 min    Equipment Utilized During Treatment  Gait belt    Activity Tolerance  Patient tolerated treatment well;Patient limited by fatigue    Behavior During Therapy  [facility] for tasks assessed/performed       Past Medical History:  Diagnosis Date  . Alcohol abuse   . APS (antiphospholipid syndrome) ([place])   . BPH (benign prostatic hyperplasia)   . CAD (coronary artery disease)   . Cellulitis   . Cervical spinal stenosis   . Chronic osteomyelitis ([place])   . CKD (chronic kidney disease)   . CKD (chronic kidney disease)   . Clostridium difficile diarrhea   . Depression   . Diplopia   . Fatigue   . GERD (gastroesophageal reflux disease)   . Hyperlipemia   . Hypertension   . Hypovitaminosis D   . IBS (irritable bowel syndrome)   . 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 Insomnia   . Left lumbosacral radiculopathy   . Moderate obstructive sleep apnea   . Osteomyelitis of foot ([place])   . PAD (peripheral artery disease) ([place])   . Pruritus   . RA (rheumatoid arthritis) ([place])   . Radiculopathy   . Restless legs syndrome   . RLS (restless legs syndrome)   . SLE (systemic lupus erythematosus related syndrome) ([place])   . Stroke ([place])   . Toxic maculopathy of both eyes     Past Surgical History:  Procedure Laterality Date  .  CARDIAC CATHETERIZATION    . CAROTID ENDARTERECTOMY    . CERVICAL LAMINECTOMY    . CORONARY ANGIOPLASTY    . FRACTURE SURGERY    . fusion C5-6-7    . HERNIA REPAIR    . KNEE ARTHROSCOPY    . TONSILLECTOMY      There were no vitals filed for this visit.  Subjective Assessment - 06/27/18 1028    Subjective  Patient reports doing well; Denies any pain; Denies minimal soreness from previous sessions; no new changes;     Patient is accompained by:  Family member   intermittantly; she thought she could not come in   Pertinent History  Patient is a 73 y.o. male who presents to outpatient physical therapy with a referral for medical diagnosis of CVA. This patient's chief complaints consist of left hemiplegia and overall deconditioning leading to the following functional deficits: dependent for ADLs, IADLs, unable to transfer to car, dependent transfers at home with hoyer lift, unable to walk or stand without significant assistance, difficulty with W/C navigation..    Limitations  Sitting;Lifting;Standing;House hold activities;Writing;Walking;Other (comment)   stairs, transferring   Diagnostic tests  Brain MRI 11/01/2017: IMPRESSION: 1. Acute right ACA territory infarct as demonstrated on prior CT imaging. No intracranial  hemorrhage or significant mass effect. 2. 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    Patient Stated Goals  wants to be able to walk again    Currently in Pain?  No/denies            TREATMENT: Therapeutic Activity: Patient transferred wheelchair to mat table, using sliding board, max A +2 for set up and transfer. Patient able to reach with RUE to assist with transfer but has difficulty with trunk control. Required mod VCs for positioning and correct transfer technique;  Pt transitioned sit to supine with max A +2 for lifting LE into bed and controlling trunk position to sidelying and rolling to supine. Patient requires mod VCs for  correct positioning and technique, specifically for hand placement and trunk position to reduce back discomfort.   Following supine exercise, assisted patient in rolling to right side to prepare to sit edge of mat. He is max A +2 with cues to reach with RUE to pull LUE to right side to facilitate rolling. Patient then required max A +2 for transition sidelying to sitting with assistance to slide legs off mat table and to push through LUE to raise trunk to sitting;  At end of session, transferred patient back mat table to wheelchair, leading with strong side (R side). Max A +2 for positioning and during transfer requiring therapist assist to block LE for better positioning; Patient able to reach with RUE to pull to wheelchair but does have significant weakness in trunk limiting slide board transfer;  Exercise: Hooklying with bolster under legs: PT applied NMES to LLE quad muscle, large muscle group atrophy setting, 5 sec on, 10 sec off x10 min at tolerated intensity (#43) with mod VCs to increase quad muscle activation during NMES activation; Patient able to initiate quad activation but does require AAROM to achieve full SAQ ROM despite NMES assistance. He denies any pain or discomfort. He does fatigue after 5 min requiring increased cues and time to achieve quad activation;  Sitting on mat table: Attempted LAQ while sitting on mat table. Patient unable to initiate ROM despite elevated table with feet unweighted. PT applied NMES, large muscle group atrophy setting at tolerated intensity #43, 5 sec on, 10 sec off x5 min with AAROM; patient able to initiate quad activation with partial ROM into LAQ but unable to achieve >20 degrees of motion due to weakness and fatigue; Requires AAROM to achieve full knee extension ROM in sitting; Mod VCs for erect posture required throughout exercise for improved trunk control in unsupported sitting; Patient does fatigue with prolonged unsupported sitting, often  leaning back into posterior pelvic tilt position;   Response to treatment: Patient able to exhibit better postural control and muscle activation during today's session. He followed verbal cues well with proper positioning and muscle control. Patient does fatigue in LLE quickly.  Patient denies any increase in pain with advanced exercise. Inspected skin following NMES with good skin integrity without any redness or skin breakdown.                       PT Education - 06/27/18 1028    Education Details  positioning, transfers, LE strengthening, NMES/e-stim    Person(s) Educated  Patient;Child(ren)    Methods  Explanation;Verbal cues    Comprehension  Verbalized understanding;Returned demonstration;Verbal cues required;Need further instruction       PT Short Term Goals - 06/20/18 0801      PT SHORT TERM  GOAL #1   Title  Be independent with initial home exercise program for self-management of symptoms.    Baseline  HEP to be given at second session; advice to practice unsupported sitting at home (06/19/2018)    Time  2    Period  Weeks    Status  On-going    Target Date  06/26/18        PT Long Term Goals - 06/20/18 0802      PT LONG TERM GOAL #1   Title  Patient will complete all bed mobility with min A to improve functional independence for getting in and out of bed and adjusting in bed.     Baseline  max A - total A reported by family (06/19/2018);     Time  12    Period  Weeks    Status  New    Target Date  09/11/18      PT LONG TERM GOAL #2   Title  Patient will complete sit <> stand transfer chair to chair with Min A and LRAD to improve functional independence for household and community mobility.     Baseline  max A +2 STS at W/C (06/12/2018); max A +2-3 STS or stand pivot transfer, able to complete more reps (06/19/2018);     Time  12    Period  Weeks    Status  On-going    Target Date  09/11/18      PT LONG TERM GOAL #3   Title  Patient will  navigate manual w/c with min A x 100 feet to improve mobility for household and short community distances.     Baseline  required total A (06/12/2018); able to roll 20 feet with min A (06/20/2018);     Time  12    Period  Weeks    Status  New    Target Date  09/11/18      PT LONG TERM GOAL #4   Title  Patient will complete car transfers W/C to family SUV with min A using LRAD to improve his abilty to participate in community activities.     Baseline  unable to complete car transfers at all (06/12/2018); unable to complete car transfers at all (06/19/2018).    Time  12    Period  Weeks    Status  On-going    Target Date  09/11/18      PT LONG TERM GOAL #5   Title  Patient will ambulate at least 150 feet with mod I using LRAD to improve mobility for household and community distances.     Baseline  unable to take steps (06/12/2018); able to shift R leg forward and back with max A and RW at edge of plinth (06/19/2018);    Time  12    Period  Weeks    Status  On-going    Target Date  09/11/18      PT LONG TERM GOAL #6   Title  Patient will navigate 4 steps with min A and BUE support to improve his ability to access community and social participation.     Baseline  unable to complet stairs (06/12/2018); unable to complete stairs (06/19/2018);    Time  12    Period  Weeks    Status  On-going    Target Date  09/11/18            Plan - 06/27/18 1240    Clinical Impression Statement  Patient highly motivated during  treatment session and tolerated well. He was able to exhibit good muscle activation during NMES although still unable to achieve full knee extension with SAQ or LAQ, requiring AAROM for full ROM. Patient exhibits good skin integrity following NMES. Patient would benefit from additional skilled PT intervention to improve strength and mobility;     Personal Factors and Comorbidities  Age;Comorbidity 3+    Comorbidities  Relevant past medical history and comorbidities include long term  steroid use for lupus, CKD, chronic osteomyelitis, cervical pine stenosis, BPH, APS, alcohol abuse, peripheral artery disease, depression, GERD, obstructive sleep apnea, HTN, IBS, IgA deficiency, lumbar radiculopathy, RA, Stroke, toxic maculopathy in both eyes, systemic lupus erythematosus related syndrome, cardiac catheterization, carotid endarterectomy, cervical laminectomy, coronary angioplasty, Fusion C5-C7, knee arthroplasty, lumbar surgery.     Examination-Activity Limitations  Bed Mobility;Bend;Sit;Toileting;Stand;Stairs;Lift;Transfers;Squat;Locomotion Level;Carry;Dressing;Hygiene/Grooming;Continence    Examination-Participation Restrictions  Yard Work;Interpersonal Relationship;Community Activity    Stability/Clinical Decision Making  Evolving/Moderate complexity    Rehab Potential  Fair    PT Frequency  3x / week    PT Duration  12 weeks    PT Treatment/Interventions  ADLs/Self Care Home Management;Electrical Stimulation;Moist Heat;Cryotherapy;Gait training;Stair training;Functional mobility training;Therapeutic exercise;Balance training;Neuromuscular re-education;Cognitive remediation;Patient/family education;Orthotic Fit/Training;Wheelchair mobility training;Manual techniques;Passive range of motion;Energy conservation;Joint Manipulations    PT Next Visit Plan  work on functional strength and establish HEP    PT Home Exercise Plan  will establish next session    Consulted and Agree with Plan of Care  Patient;Family member/caregiver    Family Member Consulted  daugher [name]       Patient will benefit from skilled therapeutic intervention in order to improve the following deficits and impairments:  Abnormal gait, Decreased activity tolerance, Decreased cognition, Decreased endurance, Decreased knowledge of use of DME, Decreased range of motion, Decreased skin integrity, Decreased strength, Impaired perceived functional ability, Impaired sensation, Impaired UE functional use, Improper body  mechanics, Pain, Cardiopulmonary status limiting activity, Decreased balance, Decreased coordination, Decreased mobility, Difficulty walking, Impaired tone, Postural dysfunction  Visit Diagnosis: Muscle weakness (generalized)  Other lack of coordination  Difficulty in walking, not elsewhere classified     Problem List There are no active problems to display for this patient.   Micky Sheller PT, DPT 06/27/2018, 12:55 PM  [place] MAIN [facility] SERVICES [address] [place], [place], [address] Phone: [phone]   Fax:  [phone]  Name: Kenneth Summers MRN: [id number] Date of Birth: 29-Nov-1945

## 2018-07-02 ENCOUNTER — Ambulatory Visit: Payer: Medicare Other | Admitting: Occupational Therapy

## 2018-07-02 ENCOUNTER — Encounter: Payer: Self-pay | Admitting: Physical Therapy

## 2018-07-02 ENCOUNTER — Other Ambulatory Visit: Payer: Self-pay

## 2018-07-02 ENCOUNTER — Encounter: Payer: Self-pay | Admitting: Occupational Therapy

## 2018-07-02 ENCOUNTER — Ambulatory Visit: Payer: Medicare Other | Admitting: Physical Therapy

## 2018-07-02 DIAGNOSIS — M6281 Muscle weakness (generalized): Secondary | ICD-10-CM

## 2018-07-02 DIAGNOSIS — R278 Other lack of coordination: Secondary | ICD-10-CM

## 2018-07-02 DIAGNOSIS — I69354 Hemiplegia and hemiparesis following cerebral infarction affecting left non-dominant side: Secondary | ICD-10-CM | POA: Diagnosis not present

## 2018-07-02 DIAGNOSIS — R262 Difficulty in walking, not elsewhere classified: Secondary | ICD-10-CM

## 2018-07-02 NOTE — Therapy (Signed)
[place] MAIN [facility] SERVICES [address] [place], [place], [address] Phone: [phone]   Fax:  [phone]  Occupational Therapy Treatment  Patient Details  Name: Kenneth Summers MRN: [id number] Date of Birth: [birth year] No data recorded  Encounter Date: 07/02/2018  OT End of Session - 07/02/18 1621    Visit Number  6    Number of Visits  24    Date for OT Re-Evaluation  09/04/18    Authorization Type  Progress report period starting 06/12/2018    OT Start Time  1430    OT Stop Time  1515    OT Time Calculation (min)  45 min    Activity Tolerance  Patient tolerated treatment well    Behavior During Therapy  [facility] for tasks assessed/performed       Past Medical History:  Diagnosis Date  . Alcohol abuse   . APS (antiphospholipid syndrome) ([place])   . BPH (benign prostatic hyperplasia)   . CAD (coronary artery disease)   . Cellulitis   . Cervical spinal stenosis   . Chronic osteomyelitis ([place])   . CKD (chronic kidney disease)   . CKD (chronic kidney disease)   . Clostridium difficile diarrhea   . Depression   . Diplopia   . Fatigue   . GERD (gastroesophageal reflux disease)   . Hyperlipemia   . Hypertension   . Hypovitaminosis D   . IBS (irritable bowel syndrome)   . 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 Insomnia   . Left lumbosacral radiculopathy   . Moderate obstructive sleep apnea   . Osteomyelitis of foot ([place])   . PAD (peripheral artery disease) ([place])   . Pruritus   . RA (rheumatoid arthritis) ([place])   . Radiculopathy   . Restless legs syndrome   . RLS (restless legs syndrome)   . SLE (systemic lupus erythematosus related syndrome) ([place])   . Stroke ([place])   . Toxic maculopathy of both eyes     Past Surgical History:  Procedure Laterality Date  . CARDIAC CATHETERIZATION    . CAROTID ENDARTERECTOMY    . CERVICAL LAMINECTOMY    . CORONARY ANGIOPLASTY    . FRACTURE SURGERY    . fusion C5-6-7    . HERNIA REPAIR    . KNEE  ARTHROSCOPY    . TONSILLECTOMY      There were no vitals filed for this visit.  Subjective Assessment - 07/02/18 1619    Subjective   Pt.'s daughter was present during the session    Pertinent History  Pt. is a 73 y.o. 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 Pt. PMHx includes: Multiple Falls, Lupus, DJD, Renal Abscess, CVA. Pt. resides with his wife. Pt.'s wife and daughter assist with ADLs. Pt. has caregivers in  for 2 hours a day, 6 days a week. Pt. received Rehab services in acute care, at SNF for STR, and [place] services. Pt. is retired from [employer] for [employer] and [employer].    Currently in Pain?  Yes    Pain Score  8     Pain Location  Shoulder    Pain Orientation  Left      OT TREATMENT    Neuro muscular re-education:  Pt.  Focused on improving [facility] with manipulating nuts and bolts positioned vertically. Pt. Was able to unscrew both the 1" and 1/2" nuts, however was unable to remove the nuts without dropping them.  Pt.  Had more difficulty grasping and placing the nuts into position and alignment. Pt. Initially grasped, and attempted to remove the bolts with his right hand. The position of the bolt tower was modified for easier access from W/C  Therapeutic Exercise:  Pt. performed 3# dowel ex. For UE strengthening secondary to weakness. Bilateral shoulder flexion, chest press, circular patterns, and elbow flexion/extension were performed. 2# dumbbell ex. for elbow flexion and extension, and forearm supination/pronation. Pt. requires rest breaks and verbal cues for proper technique. Pt. Worked on pinch strengthening in the left hand for lateral, and 3pt. pinch using yellow, red, and green resistive clips. Pt. worked on placing the clips at various vertical and horizontal angles. Tactile and verbal cues were required for eliciting the desired movement. Pt. Worked on reaching for clips with the RUE,  with a 1# cuff weight, and moving the clips  to the left hand. Pt. was able to reposition the clips in his left hand to prepare for pinching. Pt. Worked on a gross gipper with a green, and yellow strength resistive band. Pt. Worked on the 1.5# digiflex with cues.    Response to treatment:  Pt. Required cues for attention, and left sided awareness. Pt. with left shoulder discomfort today.                         OT Education - 07/02/18 1621    Education Details  OT services, UE functioning, goals, POC, grip, and pinch strength    Person(s) Educated  Patient;Child(ren)    Methods  Explanation    Comprehension  Verbalized understanding;Returned demonstration          OT Long Term Goals - 06/12/18 1701      OT LONG TERM GOAL #1   Title  Pt. will increase left shoulder flexion AROM by 10 degrees to access cabinet/shelf.    Baseline  Eval: Left shoulder flexion 70(96)    Time  12    Period  Weeks    Status  New    Target Date  09/04/18      OT LONG TERM GOAL #2   Title  Pt. will donn a shirt with Supervision.    Baseline  Eval: ModA    Time  12    Period  Weeks    Status  New    Target Date  09/02/18      OT LONG TERM GOAL #3   Title  Pt. will require ModA to perform LE dressing    Baseline  Eval: Total    Time  12    Period  Weeks    Status  New    Target Date  09/04/18      OT LONG TERM GOAL #4   Title  Pt. will improve left grip strength by 10# to be able to open a jar/container.    Baseline  Eval:  Left: 25, Right 40    Time  12    Period  Weeks    Target Date  09/04/18      OT LONG TERM GOAL #5   Title  Pt. will improve left hand [facility] skills to be able to assit with buttoning/zipping.    Baseline  Eval: Left: 2 min. & 12 sec., R: 1 min. & 8 sec.    Time  12    Period  Weeks    Status  New    Target Date  09/04/18      Long  Term Additional Goals   Additional Long Term Goals  Yes      OT LONG TERM GOAL #6   Title  Pt. will demonstrate visual conmpensatory strategies during 100% of  the time during self care.    Baseline  Eval: Diplopia. Unable    Time  12    Period  Weeks    Status  New    Target Date  09/04/18      OT LONG TERM GOAL #7   Title  Pt. will prepare a simple cold snack from w/c with supervision using cognitive compensatory strategies 100% of the time.    Baseline  Eval: unable    Time  12    Period  Weeks    Status  New    Target Date  09/04/18      OT LONG TERM GOAL #8   Title  Pt. will accurately identify potential saeftey hazard using good safety awareness, and judgement 100% for ADLs, and IADLs.    Baseline  Eval: unable    Time  12    Period  Weeks    Status  New    Target Date  09/04/18            Plan - 07/02/18 1622    Clinical Impression Statement  Pt. and daughter reports that they have been doing exercises at home, and may have overdone it. Pt. continues to present with improved attention, and left sided awareness. Pt. has improved with overall activity tolerance. Pt. continues to work on increasing, and improving LUE engagement during ADLs, and IADL functioning.     Occupational performance deficits (Please refer to evaluation for details):  ADL's    Body Structure / Function / Physical Skills  ADL;Coordination;Balance;IADL;Pain;FMC;ROM;Tone;Proprioception;Decreased knowledge of use of DME;Vision;Strength;UE functional use    Cognitive Skills  Attention;Memory;Emotional;Problem Solve;Safety Awareness    Clinical Decision Making  Multiple treatment options, significant modification of task necessary    Comorbidities Affecting Occupational Performance:  Presence of comorbidities impacting occupational performance    Comorbidities impacting occupational performance description:  Phyical, cognitive, visual,  medical comorbidities    Modification or Assistance to Complete Evaluation   Max significant modification of tasks or assist is necessary to complete    OT Frequency  3x / week    OT Duration  12 weeks    OT  Treatment/Interventions  Self-care/ADL training;DME and/or AE instruction;Therapeutic exercise;Therapeutic activities;Moist Heat;Cognitive remediation/compensation;Neuromuscular education;Visual/perceptual remediation/compensation;Coping strategies training;Patient/family education;Passive range of motion;Psychosocial skills training;Energy conservation;Functional Mobility Training    Consulted and Agree with Plan of Care  Patient;Family member/caregiver    Family Member Consulted  Daughter [name]       Patient will benefit from skilled therapeutic intervention in order to improve the following deficits and impairments:  Cognitive Skills, Body Structure / Function / Physical Skills  Visit Diagnosis: Muscle weakness (generalized)  Other lack of coordination    Problem List There are no active problems to display for this patient.   Harrel Carina, MS, OTR/L 07/02/2018, 5:00 PM  [place] MAIN [facility] SERVICES [address] [place], [place], [address] Phone: [phone]   Fax:  [phone]  Name: AMYR SLUDER MRN: [id number] Date of Birth: 01/17/1946

## 2018-07-03 ENCOUNTER — Ambulatory Visit: Payer: Medicare Other | Admitting: Physical Therapy

## 2018-07-03 ENCOUNTER — Ambulatory Visit: Payer: Medicare Other | Admitting: Occupational Therapy

## 2018-07-03 ENCOUNTER — Encounter: Payer: Medicare Other | Admitting: Physical Therapy

## 2018-07-03 ENCOUNTER — Encounter: Payer: Self-pay | Admitting: Occupational Therapy

## 2018-07-03 ENCOUNTER — Encounter: Payer: Self-pay | Admitting: Physical Therapy

## 2018-07-03 ENCOUNTER — Encounter: Payer: Medicare Other | Admitting: Occupational Therapy

## 2018-07-03 ENCOUNTER — Other Ambulatory Visit: Payer: Self-pay

## 2018-07-03 DIAGNOSIS — M6281 Muscle weakness (generalized): Secondary | ICD-10-CM

## 2018-07-03 DIAGNOSIS — R262 Difficulty in walking, not elsewhere classified: Secondary | ICD-10-CM

## 2018-07-03 DIAGNOSIS — I69354 Hemiplegia and hemiparesis following cerebral infarction affecting left non-dominant side: Secondary | ICD-10-CM | POA: Diagnosis not present

## 2018-07-03 DIAGNOSIS — R278 Other lack of coordination: Secondary | ICD-10-CM

## 2018-07-03 NOTE — Therapy (Signed)
[place] MAIN [facility] SERVICES [address] [place], [place], [address] Phone: [phone]   Fax:  [phone]  Occupational Therapy Treatment  Patient Details  Name: Kenneth Summers MRN: [id number] Date of Birth: 08-21-1945 No data recorded  Encounter Date: 07/03/2018  OT End of Session - 07/03/18 1522    Visit Number  7    Number of Visits  24    Date for OT Re-Evaluation  09/04/18    Authorization Type  Progress report period starting 06/12/2018    OT Start Time  1425    OT Stop Time  1515    OT Time Calculation (min)  50 min    Activity Tolerance  Patient tolerated treatment well    Behavior During Therapy  [facility] for tasks assessed/performed       Past Medical History:  Diagnosis Date  . Alcohol abuse   . APS (antiphospholipid syndrome) ([place])   . BPH (benign prostatic hyperplasia)   . CAD (coronary artery disease)   . Cellulitis   . Cervical spinal stenosis   . Chronic osteomyelitis ([place])   . CKD (chronic kidney disease)   . CKD (chronic kidney disease)   . Clostridium difficile diarrhea   . Depression   . Diplopia   . Fatigue   . GERD (gastroesophageal reflux disease)   . Hyperlipemia   . Hypertension   . Hypovitaminosis D   . IBS (irritable bowel syndrome)   . 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 Insomnia   . Left lumbosacral radiculopathy   . Moderate obstructive sleep apnea   . Osteomyelitis of foot ([place])   . PAD (peripheral artery disease) ([place])   . Pruritus   . RA (rheumatoid arthritis) ([place])   . Radiculopathy   . Restless legs syndrome   . RLS (restless legs syndrome)   . SLE (systemic lupus erythematosus related syndrome) ([place])   . Stroke ([place])   . Toxic maculopathy of both eyes     Past Surgical History:  Procedure Laterality Date  . CARDIAC CATHETERIZATION    . CAROTID ENDARTERECTOMY    . CERVICAL LAMINECTOMY    . CORONARY ANGIOPLASTY    . FRACTURE SURGERY    . fusion C5-6-7    . HERNIA REPAIR    . KNEE  ARTHROSCOPY    . TONSILLECTOMY      There were no vitals filed for this visit.  Subjective Assessment - 07/03/18 1521    Subjective   Pt.'s daughter was present during the session    Pertinent History  Pt. is a 73 y.o. 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 Pt. PMHx includes: Multiple Falls, Lupus, DJD, Renal Abscess, CVA. Pt. resides with his wife. Pt.'s wife and daughter assist with ADLs. Pt. has caregivers in  for 2 hours a day, 6 days a week. Pt. received Rehab services in acute care, at SNF for STR, and [place] services. Pt. is retired from [employer] for [employer] and [employer].    Patient Stated Goals  To regain use of his left UE, and do more for himself.    Currently in Pain?  No/denies      OT TREATMENT    Selfcare:  Pt. worked on LE dressing skills including donning and doffing socks, and shoes. Pt. required modA doffing the right shoe, and sock. MaxA donning, and doffing the left shoe, and sock. Pt. worked on using [employer], however changed to a  dressing stick with improvement. Pt. utilized a LH shoehorn with max cues. Pt. was provided with elastic shoelaces, a long handled shoehorn, and a dressing stick.  Response to treatment:   Pt. responded well to treatment with improved trunk forward flexion, improved left sided awareness, and increased engagement of the LUE during the task. Pt. Required less cuing, and assisted in problem-solving during the task.                       OT Education - 07/03/18 1521    Education Details  LE dressing    Person(s) Educated  Patient;Child(ren)    Methods  Explanation    Comprehension  Verbalized understanding;Returned demonstration          OT Long Term Goals - 06/12/18 1701      OT LONG TERM GOAL #1   Title  Pt. will increase left shoulder flexion AROM by 10 degrees to access cabinet/shelf.    Baseline  Eval: Left shoulder flexion 70(96)    Time  12    Period   Weeks    Status  New    Target Date  09/04/18      OT LONG TERM GOAL #2   Title  Pt. will donn a shirt with Supervision.    Baseline  Eval: ModA    Time  12    Period  Weeks    Status  New    Target Date  09/02/18      OT LONG TERM GOAL #3   Title  Pt. will require ModA to perform LE dressing    Baseline  Eval: Total    Time  12    Period  Weeks    Status  New    Target Date  09/04/18      OT LONG TERM GOAL #4   Title  Pt. will improve left grip strength by 10# to be able to open a jar/container.    Baseline  Eval:  Left: 25, Right 40    Time  12    Period  Weeks    Target Date  09/04/18      OT LONG TERM GOAL #5   Title  Pt. will improve left hand [facility] skills to be able to assit with buttoning/zipping.    Baseline  Eval: Left: 2 min. & 12 sec., R: 1 min. & 8 sec.    Time  12    Period  Weeks    Status  New    Target Date  09/04/18      Long Term Additional Goals   Additional Long Term Goals  Yes      OT LONG TERM GOAL #6   Title  Pt. will demonstrate visual conmpensatory strategies during 100% of the time during self care.    Baseline  Eval: Diplopia. Unable    Time  12    Period  Weeks    Status  New    Target Date  09/04/18      OT LONG TERM GOAL #7   Title  Pt. will prepare a simple cold snack from w/c with supervision using cognitive compensatory strategies 100% of the time.    Baseline  Eval: unable    Time  12    Period  Weeks    Status  New    Target Date  09/04/18      OT LONG TERM GOAL #8   Title  Pt. will accurately identify potential  saeftey hazard using good safety awareness, and judgement 100% for ADLs, and IADLs.    Baseline  Eval: unable    Time  12    Period  Weeks    Status  New    Target Date  09/04/18            Plan - 07/03/18 1523    Clinical Impression Statement Pt. requires extensive assist with his morning care rountine. Per pt. Daughter, pt. has attempted A/E use in the past without success. Pt. presented with improved LUE  engagement, and left sided awareness during the task, and with A/E. Pt. education was provided about A/E use for LE ADLs. Pt. continues to work on improving BUE strength, and [facility] skills. Pt. continues to work on improving ADL, and IADL functoning to maximize independence with basic ADLs, and IADLs.    Occupational performance deficits (Please refer to evaluation for details):  ADL's    Body Structure / Function / Physical Skills  ADL;Coordination;Balance;IADL;Pain;FMC;ROM;Tone;Proprioception;Decreased knowledge of use of DME;Vision;Strength;UE functional use    Cognitive Skills  Attention;Memory;Emotional;Problem Solve;Safety Awareness    Clinical Decision Making  Multiple treatment options, significant modification of task necessary    Comorbidities Affecting Occupational Performance:  Presence of comorbidities impacting occupational performance    Comorbidities impacting occupational performance description:  Phyical, cognitive, visual,  medical comorbidities    Modification or Assistance to Complete Evaluation   Max significant modification of tasks or assist is necessary to complete    OT Frequency  3x / week    OT Duration  12 weeks    OT Treatment/Interventions  Self-care/ADL training;DME and/or AE instruction;Therapeutic exercise;Therapeutic activities;Moist Heat;Cognitive remediation/compensation;Neuromuscular education;Visual/perceptual remediation/compensation;Coping strategies training;Patient/family education;Passive range of motion;Psychosocial skills training;Energy conservation;Functional Mobility Training    Consulted and Agree with Plan of Care  Patient;Family member/caregiver    Family Member Consulted  Daughter [name]       Patient will benefit from skilled therapeutic intervention in order to improve the following deficits and impairments:  Cognitive Skills, Body Structure / Function / Physical Skills  Visit Diagnosis: Muscle weakness (generalized)    Problem List There are  no active problems to display for this patient.   Harrel Carina, MS, OTR/L 07/03/2018, 3:33 PM  [place] MAIN [facility] SERVICES [address] [place], [place], [address] Phone: [phone]   Fax:  [phone]  Name: Kenneth Summers MRN: [id number] Date of Birth: Dec 10, 1945

## 2018-07-03 NOTE — Therapy (Signed)
[place] MAIN [facility] SERVICES [address] [place], [place], [address] Phone: [phone]   Fax:  [phone]  Physical Therapy Treatment  Patient Details  Name: Kenneth Summers MRN: [id number] Date of Birth: 06-10-1945 Referring Provider (PT): Dr. [name], Guy Begin   Encounter Date: 07/02/2018  PT End of Session - 07/02/18 1338    Visit Number  7    Number of Visits  24    Date for PT Re-Evaluation  09/11/18    Authorization Type  Medicare reporting period from 06/12/2018    Authorization Time Period  Current cert period 5/46/2703 - 09/11/2018    PT Start Time  1332    PT Stop Time  1415    PT Time Calculation (min)  43 min    Equipment Utilized During Treatment  Gait belt    Activity Tolerance  Patient tolerated treatment well;Patient limited by fatigue    Behavior During Therapy  [facility] for tasks assessed/performed       Past Medical History:  Diagnosis Date  . Alcohol abuse   . APS (antiphospholipid syndrome) ([place])   . BPH (benign prostatic hyperplasia)   . CAD (coronary artery disease)   . Cellulitis   . Cervical spinal stenosis   . Chronic osteomyelitis ([place])   . CKD (chronic kidney disease)   . CKD (chronic kidney disease)   . Clostridium difficile diarrhea   . Depression   . Diplopia   . Fatigue   . GERD (gastroesophageal reflux disease)   . Hyperlipemia   . Hypertension   . Hypovitaminosis D   . IBS (irritable bowel syndrome)   . 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 Insomnia   . Left lumbosacral radiculopathy   . Moderate obstructive sleep apnea   . Osteomyelitis of foot ([place])   . PAD (peripheral artery disease) ([place])   . Pruritus   . RA (rheumatoid arthritis) ([place])   . Radiculopathy   . Restless legs syndrome   . RLS (restless legs syndrome)   . SLE (systemic lupus erythematosus related syndrome) ([place])   . Stroke ([place])   . Toxic maculopathy of both eyes     Past Surgical History:  Procedure Laterality Date  .  CARDIAC CATHETERIZATION    . CAROTID ENDARTERECTOMY    . CERVICAL LAMINECTOMY    . CORONARY ANGIOPLASTY    . FRACTURE SURGERY    . fusion C5-6-7    . HERNIA REPAIR    . KNEE ARTHROSCOPY    . TONSILLECTOMY      There were no vitals filed for this visit.  Subjective Assessment - 07/02/18 1329    Subjective  Pt reports sleeping on his left side and states after laying on it he couldn't move it. He does report a little foot discomfort especially when turned. He does report adherence with HEP with good results.     Patient is accompained by:  Family member   intermittantly; she thought she could not come in   Pertinent History  Patient is a 73 y.o. male who presents to outpatient physical therapy with a referral for medical diagnosis of CVA. This patient's chief complaints consist of left hemiplegia and overall deconditioning leading to the following functional deficits: dependent for ADLs, IADLs, unable to transfer to car, dependent transfers at home with hoyer lift, unable to walk or stand without significant assistance, difficulty with W/C navigation..    Limitations  Sitting;Lifting;Standing;House hold activities;Writing;Walking;Other (comment)   stairs, transferring   Diagnostic  tests  Brain MRI 11/01/2017: IMPRESSION: 1. Acute right ACA territory infarct as demonstrated on prior CT imaging. No intracranial hemorrhage or significant mass effect. 2. 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    Patient Stated Goals  wants to be able to walk again    Currently in Pain?  Yes    Pain Score  7     Pain Location  Foot    Pain Orientation  Left    Pain Descriptors / Indicators  Tender;Sharp    Pain Type  Acute pain    Pain Onset  Yesterday    Pain Frequency  Intermittent    Aggravating Factors   pressure/weight bearing    Pain Relieving Factors  rest    Effect of Pain on Daily Activities  decreased tolerance with certain positions.              TREATMENT: Therapeutic Activity: Patient transferred wheelchair to mat table, using sliding board, progressed to mod A +2 for set up and transfer. Patient able to reach with RUE to assist with transfer, instructed patient to position trunk and hips in proper position prior to transfer for optimum mobility. Patient does require increased time but was able to participate more as compared to previous session.   Pt transitioned sit to supine with max A +2 for lifting LE into bed and controlling trunk position to sidelying and rolling to supine. Patient requires mod VCs for correct positioning and technique, specifically for hand placement and trunk position to reduce back discomfort.   Following supine exercise, assisted patient in rolling to left side to prepare to sit edge of mat. He is max A +2 with cues to reach with RUE to facilitate rolling. Patient then required max A +2 for transition sidelying to sitting with assistance to slide legs off mat table and to push through LUE to raise trunk to sitting;  At end of session, transferred patient back mat table to wheelchair, leading with strong side (R side). Mod A +2 for positioning and during transfer requiring therapist assist to block LE for better positioning; Patient able to reach with RUE to pull to wheelchair but does have significant weakness in trunk limiting slide board transfer; He requires increased time due to weakness and difficulty sliding on board.   Exercise: Hooklying with bolster under legs: PT applied NMES to LLE quad muscle, large muscle group atrophy setting, 5 sec on, 10 sec off x10 min at tolerated intensity (#31-33) with mod VCs to increase quad muscle activation during NMES activation; Patient able to initiate quad activation but does require AAROM to achieve full SAQ ROM despite NMES assistance. He denies any pain or discomfort. He does fatigue after 5 min requiring increased cues and time to achieve quad activation; Overall  he was able to exhibit better muscle activation during today's session as compared to previous session.   Hooklying: Pt instructed to hold legs together in hooklying position while therapist attached and removed NMES patches. Patient able to hold position without UE assist indicating improved LLE muscle activation and control.   Sitting on mat table: Instructed patient in seated LAQ x5 repetitions. He was able to exhibit partial ROM on LLE without NMES activation indicating improving motor control and muscle activation. Patient does fatigue after 5 repetitions exhibiting decreased ROM.  Response to treatment: Patient able to exhibit better postural control and muscle activation during today's session. He followed verbal cues well with proper positioning and muscle control.  Patient does fatigue in LLE quickly.  Patient denies any increase in pain with advanced exercise. Inspected skin following NMES with good skin integrity without any redness or skin breakdown.                      PT Education - 07/02/18 1332    Education Details  positioning, transfers, NMES, LE strengthening;     Person(s) Educated  Patient;Child(ren)    Methods  Explanation;Verbal cues    Comprehension  Verbalized understanding;Returned demonstration;Verbal cues required;Need further instruction       PT Short Term Goals - 06/20/18 0801      PT SHORT TERM GOAL #1   Title  Be independent with initial home exercise program for self-management of symptoms.    Baseline  HEP to be given at second session; advice to practice unsupported sitting at home (06/19/2018)    Time  2    Period  Weeks    Status  On-going    Target Date  06/26/18        PT Long Term Goals - 06/20/18 0802      PT LONG TERM GOAL #1   Title  Patient will complete all bed mobility with min A to improve functional independence for getting in and out of bed and adjusting in bed.     Baseline  max A - total A reported by family  (06/19/2018);     Time  12    Period  Weeks    Status  New    Target Date  09/11/18      PT LONG TERM GOAL #2   Title  Patient will complete sit <> stand transfer chair to chair with Min A and LRAD to improve functional independence for household and community mobility.     Baseline  max A +2 STS at W/C (06/12/2018); max A +2-3 STS or stand pivot transfer, able to complete more reps (06/19/2018);     Time  12    Period  Weeks    Status  On-going    Target Date  09/11/18      PT LONG TERM GOAL #3   Title  Patient will navigate manual w/c with min A x 100 feet to improve mobility for household and short community distances.     Baseline  required total A (06/12/2018); able to roll 20 feet with min A (06/20/2018);     Time  12    Period  Weeks    Status  New    Target Date  09/11/18      PT LONG TERM GOAL #4   Title  Patient will complete car transfers W/C to family SUV with min A using LRAD to improve his abilty to participate in community activities.     Baseline  unable to complete car transfers at all (06/12/2018); unable to complete car transfers at all (06/19/2018).    Time  12    Period  Weeks    Status  On-going    Target Date  09/11/18      PT LONG TERM GOAL #5   Title  Patient will ambulate at least 150 feet with mod I using LRAD to improve mobility for household and community distances.     Baseline  unable to take steps (06/12/2018); able to shift R leg forward and back with max A and RW at edge of plinth (06/19/2018);    Time  12    Period  Weeks  Status  On-going    Target Date  09/11/18      PT LONG TERM GOAL #6   Title  Patient will navigate 4 steps with min A and BUE support to improve his ability to access community and social participation.     Baseline  unable to complet stairs (06/12/2018); unable to complete stairs (06/19/2018);    Time  12    Period  Weeks    Status  On-going    Target Date  09/11/18            Plan - 07/03/18 0758    Clinical Impression  Statement  Patient progressing well. He expressed depression and frustration over lack of mobility at start of session. However seemed pleased with progress made in session with improved motor contraction and LLE movement. Patient does fatigue with advanced exercise. He denies any increase in pain. He was able to participate with transfers more progressing to [place] with slide board transfer. He does require increased time for instruction and positioning for better participation and movement. Patient would benefit from additional skilled PT Intervention to improve strength, balance and mobility;     Personal Factors and Comorbidities  Age;Comorbidity 3+    Comorbidities  Relevant past medical history and comorbidities include long term steroid use for lupus, CKD, chronic osteomyelitis, cervical pine stenosis, BPH, APS, alcohol abuse, peripheral artery disease, depression, GERD, obstructive sleep apnea, HTN, IBS, IgA deficiency, lumbar radiculopathy, RA, Stroke, toxic maculopathy in both eyes, systemic lupus erythematosus related syndrome, cardiac catheterization, carotid endarterectomy, cervical laminectomy, coronary angioplasty, Fusion C5-C7, knee arthroplasty, lumbar surgery.     Examination-Activity Limitations  Bed Mobility;Bend;Sit;Toileting;Stand;Stairs;Lift;Transfers;Squat;Locomotion Level;Carry;Dressing;Hygiene/Grooming;Continence    Examination-Participation Restrictions  Yard Work;Interpersonal Relationship;Community Activity    Stability/Clinical Decision Making  Evolving/Moderate complexity    Rehab Potential  Fair    PT Frequency  3x / week    PT Duration  12 weeks    PT Treatment/Interventions  ADLs/Self Care Home Management;Electrical Stimulation;Moist Heat;Cryotherapy;Gait training;Stair training;Functional mobility training;Therapeutic exercise;Balance training;Neuromuscular re-education;Cognitive remediation;Patient/family education;Orthotic Fit/Training;Wheelchair mobility  training;Manual techniques;Passive range of motion;Energy conservation;Joint Manipulations    PT Next Visit Plan  work on functional strength and establish HEP    PT Home Exercise Plan  will establish next session    Consulted and Agree with Plan of Care  Patient;Family member/caregiver    Family Member Consulted  daugher [name]       Patient will benefit from skilled therapeutic intervention in order to improve the following deficits and impairments:  Abnormal gait, Decreased activity tolerance, Decreased cognition, Decreased endurance, Decreased knowledge of use of DME, Decreased range of motion, Decreased skin integrity, Decreased strength, Impaired perceived functional ability, Impaired sensation, Impaired UE functional use, Improper body mechanics, Pain, Cardiopulmonary status limiting activity, Decreased balance, Decreased coordination, Decreased mobility, Difficulty walking, Impaired tone, Postural dysfunction  Visit Diagnosis: Muscle weakness (generalized)  Other lack of coordination  Difficulty in walking, not elsewhere classified     Problem List There are no active problems to display for this patient.   Brixton Franko PT, DPT 07/03/2018, 8:01 AM  [place] MAIN [facility] SERVICES [address] [place], [place], [address] Phone: [phone]   Fax:  [phone]  Name: Kenneth Summers MRN: [id number] Date of Birth: [birth year]

## 2018-07-03 NOTE — Therapy (Signed)
[name] [facility] SERVICES [address] [place], [place], [address] Phone: [phone]   Fax:  [phone]  Physical Therapy Treatment  Patient Details  Name: Kenneth Summers MRN: [id number] Date of Birth: Dec 29, 1945 Referring Provider (PT): Dr. [name], Guy Begin   Encounter Date: 07/03/2018  PT End of Session - 07/03/18 1317    Visit Number  8    Number of Visits  24    Date for PT Re-Evaluation  09/11/18    Authorization Type  Medicare reporting period from 06/12/2018    Authorization Time Period  Current cert period 05/16/7351 - 09/11/2018    PT Start Time  1332    PT Stop Time  1415    PT Time Calculation (min)  43 min    Equipment Utilized During Treatment  Gait belt    Activity Tolerance  Patient tolerated treatment well;Patient limited by fatigue    Behavior During Therapy  [facility] for tasks assessed/performed       Past Medical History:  Diagnosis Date  . Alcohol abuse   . APS (antiphospholipid syndrome) ([place])   . BPH (benign prostatic hyperplasia)   . CAD (coronary artery disease)   . Cellulitis   . Cervical spinal stenosis   . Chronic osteomyelitis ([place])   . CKD (chronic kidney disease)   . CKD (chronic kidney disease)   . Clostridium difficile diarrhea   . Depression   . Diplopia   . Fatigue   . GERD (gastroesophageal reflux disease)   . Hyperlipemia   . Hypertension   . Hypovitaminosis D   . IBS (irritable bowel syndrome)   . 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 Insomnia   . Left lumbosacral radiculopathy   . Moderate obstructive sleep apnea   . Osteomyelitis of foot ([place])   . PAD (peripheral artery disease) ([place])   . Pruritus   . RA (rheumatoid arthritis) ([place])   . Radiculopathy   . Restless legs syndrome   . RLS (restless legs syndrome)   . SLE (systemic lupus erythematosus related syndrome) ([place])   . Stroke ([place])   . Toxic maculopathy of both eyes     Past Surgical History:  Procedure Laterality Date  .  CARDIAC CATHETERIZATION    . CAROTID ENDARTERECTOMY    . CERVICAL LAMINECTOMY    . CORONARY ANGIOPLASTY    . FRACTURE SURGERY    . fusion C5-6-7    . HERNIA REPAIR    . KNEE ARTHROSCOPY    . TONSILLECTOMY      There were no vitals filed for this visit.  Subjective Assessment - 07/03/18 1421    Subjective  Patient reports doing well; He reports slight hip discomfort today from positioning in wheelchair. He denies any new falls or new injuries. No soreness after last session;     Patient is accompained by:  Family member   intermittantly; she thought she could not come in   Pertinent History  Patient is a 73 y.o. male who presents to outpatient physical therapy with a referral for medical diagnosis of CVA. This patient's chief complaints consist of left hemiplegia and overall deconditioning leading to the following functional deficits: dependent for ADLs, IADLs, unable to transfer to car, dependent transfers at home with hoyer lift, unable to walk or stand without significant assistance, difficulty with W/C navigation..    Limitations  Sitting;Lifting;Standing;House hold activities;Writing;Walking;Other (comment)   stairs, transferring   Diagnostic tests  Brain MRI 11/01/2017: IMPRESSION: 1. Acute right  ACA territory infarct as demonstrated on prior CT imaging. No intracranial hemorrhage or significant mass effect. 2. 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    Patient Stated Goals  wants to be able to walk again    Currently in Pain?  Yes    Pain Score  2     Pain Location  Hip    Pain Orientation  Right;Left;Lateral    Pain Descriptors / Indicators  Aching;Sore    Pain Type  Acute pain    Pain Onset  Today    Pain Frequency  Intermittent    Aggravating Factors   pressure/positioning;     Pain Relieving Factors  rest    Effect of Pain on Daily Activities  decreased tolerance with certain positions;     Multiple Pain Sites  No         TREATMENT: Prior to exercise, PT instructed patient in transfers and bed mobility;     Therapeutic Activity: Patient transferred wheelchair to mat table, using sliding board, progressed to mod A +2 for set up and transfer. Patient able to reach with RUE to assist with transfer, instructed patient to position trunk and hips in proper position prior to transfer for optimum mobility. Patient does require increased time but was able to participate more as compared to previous session.   Educated patient in bed mobility:  Pt transitioned sit to right sidelying with mod A +2 for lifting LE into bed and controlling trunk position to sidelying and rolling to supine. Patient requires mod VCs for correct positioning and technique, specifically for hand placement and trunk position to reduce back discomfort. Rolled sidelying to hooklying with cues to keep knees bent for better positioning to reduce back discomfort;    Instructed patient in bed mobility with rolling, supine<>sitting x3 reps with mod VCs for hand placement, LE positioning, trunk control and correct technique. Patient able to exhibit better motor control and muscle activation with increased repetition progressing from mod A +2 to min A +2. Patient does require cues and assistance for crossing right foot under left foot to assist with LE movement.   At end of session, transferred patient back mat table to wheelchair with sliding board, leading with strong side (R side). Mod A +2 for positioning and during transfer requiring therapist assist to block LE for better positioning; Patient able to reach with RUE to pull to wheelchair but does have significant weakness in trunk limiting slide board transfer; He requires increased time due to weakness and difficulty sliding on board.   Exercise: Seated: BUE rows red tband x10 reps with close supervision and min VCs to increase scapular retraction for better upper trunk strengthening;  Reaching  in various directions x2 min with supervision for safety. Pt able to reach with LUE well without loss of balance; While reaching instructed patient in erect sitting posture including to increase anterior pelvic rock for better pelvic control;  BUE ball up/down x5 reps BUE ball side/side x5 reps bilaterally Requires close supervision for safety; able to exhibit good balance control without loss of balance;   Response to treatment: Patient able to exhibit better postural control and muscle activation during today's session. He followed verbal cues well with proper positioning and muscle control. Patient does fatigue in LLE quickly. Patient denies any increase in pain with advanced exercise.                      PT Education -  07/03/18 1317    Education Details  positioning, bed mobility, transfers;     Person(s) Educated  Patient;Child(ren)    Methods  Explanation;Demonstration;Verbal cues    Comprehension  Verbalized understanding;Returned demonstration;Verbal cues required;Need further instruction       PT Short Term Goals - 06/20/18 0801      PT SHORT TERM GOAL #1   Title  Be independent with initial home exercise program for self-management of symptoms.    Baseline  HEP to be given at second session; advice to practice unsupported sitting at home (06/19/2018)    Time  2    Period  Weeks    Status  On-going    Target Date  06/26/18        PT Long Term Goals - 06/20/18 0802      PT LONG TERM GOAL #1   Title  Patient will complete all bed mobility with min A to improve functional independence for getting in and out of bed and adjusting in bed.     Baseline  max A - total A reported by family (06/19/2018);     Time  12    Period  Weeks    Status  New    Target Date  09/11/18      PT LONG TERM GOAL #2   Title  Patient will complete sit <> stand transfer chair to chair with Min A and LRAD to improve functional independence for household and community mobility.      Baseline  max A +2 STS at W/C (06/12/2018); max A +2-3 STS or stand pivot transfer, able to complete more reps (06/19/2018);     Time  12    Period  Weeks    Status  On-going    Target Date  09/11/18      PT LONG TERM GOAL #3   Title  Patient will navigate manual w/c with min A x 100 feet to improve mobility for household and short community distances.     Baseline  required total A (06/12/2018); able to roll 20 feet with min A (06/20/2018);     Time  12    Period  Weeks    Status  New    Target Date  09/11/18      PT LONG TERM GOAL #4   Title  Patient will complete car transfers W/C to family SUV with min A using LRAD to improve his abilty to participate in community activities.     Baseline  unable to complete car transfers at all (06/12/2018); unable to complete car transfers at all (06/19/2018).    Time  12    Period  Weeks    Status  On-going    Target Date  09/11/18      PT LONG TERM GOAL #5   Title  Patient will ambulate at least 150 feet with mod I using LRAD to improve mobility for household and community distances.     Baseline  unable to take steps (06/12/2018); able to shift R leg forward and back with max A and RW at edge of plinth (06/19/2018);    Time  12    Period  Weeks    Status  On-going    Target Date  09/11/18      PT LONG TERM GOAL #6   Title  Patient will navigate 4 steps with min A and BUE support to improve his ability to access community and social participation.     Baseline  unable  to complet stairs (06/12/2018); unable to complete stairs (06/19/2018);    Time  12    Period  Weeks    Status  On-going    Target Date  09/11/18            Plan - 07/03/18 1422    Clinical Impression Statement  patient highly motivated and willing to participate in treatment session. He tolerated session well exhibiting better positioning and ROM initiation during bed mobility. Initially patient requires increased cues and instruction but with repetition is able to initiate  better motor control and movement. Patient reports heavy fatigue at end of session. He was able to exhibit better posture and sitting balance following mobility; He would benefit from additional skilled PT intervention to improve strength, balance and mobility;     Personal Factors and Comorbidities  Age;Comorbidity 3+    Comorbidities  Relevant past medical history and comorbidities include long term steroid use for lupus, CKD, chronic osteomyelitis, cervical pine stenosis, BPH, APS, alcohol abuse, peripheral artery disease, depression, GERD, obstructive sleep apnea, HTN, IBS, IgA deficiency, lumbar radiculopathy, RA, Stroke, toxic maculopathy in both eyes, systemic lupus erythematosus related syndrome, cardiac catheterization, carotid endarterectomy, cervical laminectomy, coronary angioplasty, Fusion C5-C7, knee arthroplasty, lumbar surgery.     Examination-Activity Limitations  Bed Mobility;Bend;Sit;Toileting;Stand;Stairs;Lift;Transfers;Squat;Locomotion Level;Carry;Dressing;Hygiene/Grooming;Continence    Examination-Participation Restrictions  Yard Work;Interpersonal Relationship;Community Activity    Stability/Clinical Decision Making  Evolving/Moderate complexity    Rehab Potential  Fair    PT Frequency  3x / week    PT Duration  12 weeks    PT Treatment/Interventions  ADLs/Self Care Home Management;Electrical Stimulation;Moist Heat;Cryotherapy;Gait training;Stair training;Functional mobility training;Therapeutic exercise;Balance training;Neuromuscular re-education;Cognitive remediation;Patient/family education;Orthotic Fit/Training;Wheelchair mobility training;Manual techniques;Passive range of motion;Energy conservation;Joint Manipulations    PT Next Visit Plan  work on functional strength and establish HEP    PT Home Exercise Plan  will establish next session    Consulted and Agree with Plan of Care  Patient;Family member/caregiver    Family Member Consulted  daugher [name]       Patient will  benefit from skilled therapeutic intervention in order to improve the following deficits and impairments:  Abnormal gait, Decreased activity tolerance, Decreased cognition, Decreased endurance, Decreased knowledge of use of DME, Decreased range of motion, Decreased skin integrity, Decreased strength, Impaired perceived functional ability, Impaired sensation, Impaired UE functional use, Improper body mechanics, Pain, Cardiopulmonary status limiting activity, Decreased balance, Decreased coordination, Decreased mobility, Difficulty walking, Impaired tone, Postural dysfunction  Visit Diagnosis: Muscle weakness (generalized)  Other lack of coordination  Difficulty in walking, not elsewhere classified     Problem List There are no active problems to display for this patient.   Tanecia Mccay PT, DPT 07/03/2018, 2:23 PM  [place] MAIN [facility] SERVICES [address] [place], [place], [address] Phone: [phone]   Fax:  [phone]  Name: SKYLIER KRETSCHMER MRN: [id number] Date of Birth: [birth year]

## 2018-07-04 ENCOUNTER — Encounter: Payer: Self-pay | Admitting: Occupational Therapy

## 2018-07-04 ENCOUNTER — Other Ambulatory Visit: Payer: Self-pay

## 2018-07-04 ENCOUNTER — Ambulatory Visit: Payer: Medicare Other | Admitting: Occupational Therapy

## 2018-07-04 ENCOUNTER — Ambulatory Visit: Payer: Medicare Other | Admitting: Physical Therapy

## 2018-07-04 ENCOUNTER — Encounter: Payer: Self-pay | Admitting: Physical Therapy

## 2018-07-04 DIAGNOSIS — R278 Other lack of coordination: Secondary | ICD-10-CM

## 2018-07-04 DIAGNOSIS — I69354 Hemiplegia and hemiparesis following cerebral infarction affecting left non-dominant side: Secondary | ICD-10-CM | POA: Diagnosis not present

## 2018-07-04 DIAGNOSIS — R262 Difficulty in walking, not elsewhere classified: Secondary | ICD-10-CM

## 2018-07-04 DIAGNOSIS — M6281 Muscle weakness (generalized): Secondary | ICD-10-CM

## 2018-07-04 NOTE — Therapy (Signed)
[place] MAIN [facility] SERVICES [address] [place], [place], [address] Phone: [phone]   Fax:  [phone]  Occupational Therapy Treatment  Patient Details  Name: Kenneth Summers MRN: [id number] Date of Birth: 10/19/1945 No data recorded  Encounter Date: 07/04/2018  OT End of Session - 07/04/18 1722    Visit Number  8    Number of Visits  24    Date for OT Re-Evaluation  09/04/18    Authorization Type  Progress report period starting 06/12/2018    Activity Tolerance  Patient tolerated treatment well    Behavior During Therapy  [facility] for tasks assessed/performed       Past Medical History:  Diagnosis Date  . Alcohol abuse   . APS (antiphospholipid syndrome) ([place])   . BPH (benign prostatic hyperplasia)   . CAD (coronary artery disease)   . Cellulitis   . Cervical spinal stenosis   . Chronic osteomyelitis ([place])   . CKD (chronic kidney disease)   . CKD (chronic kidney disease)   . Clostridium difficile diarrhea   . Depression   . Diplopia   . Fatigue   . GERD (gastroesophageal reflux disease)   . Hyperlipemia   . Hypertension   . Hypovitaminosis D   . IBS (irritable bowel syndrome)   . 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 Insomnia   . Left lumbosacral radiculopathy   . Moderate obstructive sleep apnea   . Osteomyelitis of foot ([place])   . PAD (peripheral artery disease) ([place])   . Pruritus   . RA (rheumatoid arthritis) ([place])   . Radiculopathy   . Restless legs syndrome   . RLS (restless legs syndrome)   . SLE (systemic lupus erythematosus related syndrome) ([place])   . Stroke ([place])   . Toxic maculopathy of both eyes     Past Surgical History:  Procedure Laterality Date  . CARDIAC CATHETERIZATION    . CAROTID ENDARTERECTOMY    . CERVICAL LAMINECTOMY    . CORONARY ANGIOPLASTY    . FRACTURE SURGERY    . fusion C5-6-7    . HERNIA REPAIR    . KNEE ARTHROSCOPY    . TONSILLECTOMY      There were no vitals filed for this  visit.  Subjective Assessment - 07/04/18 1721    Pertinent History  Pt. is a 73 y.o. 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 Pt. PMHx includes: Multiple Falls, Lupus, DJD, Renal Abscess, CVA. Pt. resides with his wife. Pt.'s wife and daughter assist with ADLs. Pt. has caregivers in  for 2 hours a day, 6 days a week. Pt. received Rehab services in acute care, at SNF for STR, and [place] services. Pt. is retired from [employer] for [employer] and [employer].    Patient Stated Goals  To regain use of his left UE, and do more for himself.    Currently in Pain?  No/denies      OT TREATMENT    Neuro muscular re-education:  Pt. worked on grasping 1" resistive cubes alternating thumb opposition to the tip of the 2nd through 5th digits while the board is placed at a vertical angle. Pt. worked on pressing the cubes back into place while isolating his 2nd digit extension. Pt. initially worked with a 1# cuff weight in place at the wrist. Increased time was required.  Therapeutic Exercise:  Pt. performed 2# dowel ex. For UE strengthening secondary to weakness. Bilateral shoulder flexion,  chest press, circular patterns, and elbow flexion/extension were performed. 2 sets 10 reps each. 2# dumbbell ex. for elbow flexion and extension 2 sets 10 reps. Pt. performed resistive EZ Board exercises for forearm supination/pronation, wrist flexion/extension using gross grasp. Pt. Worked on digit flexion, and extension using the resistive ring.  Selfcare:  Pt. Worked on propelling his chair engaging bilateral UEs to propel forward, backward, and for turning.  Response to treatment:  Improved sustained attention during each task throughout the treatment session despite distractions in the gym. Less cues required for attention, and left sided awareness.                       OT Education - 07/04/18 1722    Education Details  LE dressing    Person(s)  Educated  Patient;Child(ren)    Methods  Explanation    Comprehension  Verbalized understanding;Returned demonstration          OT Long Term Goals - 06/12/18 1701      OT LONG TERM GOAL #1   Title  Pt. will increase left shoulder flexion AROM by 10 degrees to access cabinet/shelf.    Baseline  Eval: Left shoulder flexion 70(96)    Time  12    Period  Weeks    Status  New    Target Date  09/04/18      OT LONG TERM GOAL #2   Title  Pt. will donn a shirt with Supervision.    Baseline  Eval: ModA    Time  12    Period  Weeks    Status  New    Target Date  09/02/18      OT LONG TERM GOAL #3   Title  Pt. will require ModA to perform LE dressing    Baseline  Eval: Total    Time  12    Period  Weeks    Status  New    Target Date  09/04/18      OT LONG TERM GOAL #4   Title  Pt. will improve left grip strength by 10# to be able to open a jar/container.    Baseline  Eval:  Left: 25, Right 40    Time  12    Period  Weeks    Target Date  09/04/18      OT LONG TERM GOAL #5   Title  Pt. will improve left hand [facility] skills to be able to assit with buttoning/zipping.    Baseline  Eval: Left: 2 min. & 12 sec., R: 1 min. & 8 sec.    Time  12    Period  Weeks    Status  New    Target Date  09/04/18      Long Term Additional Goals   Additional Long Term Goals  Yes      OT LONG TERM GOAL #6   Title  Pt. will demonstrate visual conmpensatory strategies during 100% of the time during self care.    Baseline  Eval: Diplopia. Unable    Time  12    Period  Weeks    Status  New    Target Date  09/04/18      OT LONG TERM GOAL #7   Title  Pt. will prepare a simple cold snack from w/c with supervision using cognitive compensatory strategies 100% of the time.    Baseline  Eval: unable    Time  12    Period  Weeks    Status  New    Target Date  09/04/18      OT LONG TERM GOAL #8   Title  Pt. will accurately identify potential saeftey hazard using good safety awareness, and judgement  100% for ADLs, and IADLs.    Baseline  Eval: unable    Time  12    Period  Weeks    Status  New    Target Date  09/04/18            Plan - 07/04/18 1722    Clinical Impression Statement  The pt. and his daughter report that he is assisted more with LE dressing tasks this morning, and initiated propelling his wheelchair. Pt. has improved with attention, left sided awareness, task initiation, and UE functional reaching. Pt. continues to work on engaging the LUE more during ADLs, and IADLs.    Occupational performance deficits (Please refer to evaluation for details):  ADL's    Body Structure / Function / Physical Skills  ADL;Coordination;Balance;IADL;Pain;FMC;ROM;Tone;Proprioception;Decreased knowledge of use of DME;Vision;Strength;UE functional use    Cognitive Skills  Attention;Memory;Emotional;Problem Solve;Safety Awareness    Clinical Decision Making  Multiple treatment options, significant modification of task necessary    Comorbidities Affecting Occupational Performance:  Presence of comorbidities impacting occupational performance    Comorbidities impacting occupational performance description:  Phyical, cognitive, visual,  medical comorbidities    Modification or Assistance to Complete Evaluation   Max significant modification of tasks or assist is necessary to complete    OT Frequency  3x / week    OT Duration  12 weeks    OT Treatment/Interventions  Self-care/ADL training;DME and/or AE instruction;Therapeutic exercise;Therapeutic activities;Moist Heat;Cognitive remediation/compensation;Neuromuscular education;Visual/perceptual remediation/compensation;Coping strategies training;Patient/family education;Passive range of motion;Psychosocial skills training;Energy conservation;Functional Mobility Training    Consulted and Agree with Plan of Care  Patient;Family member/caregiver    Family Member Consulted  Daughter [name]       Patient will benefit from skilled therapeutic  intervention in order to improve the following deficits and impairments:  Cognitive Skills, Body Structure / Function / Physical Skills  Visit Diagnosis: Muscle weakness (generalized)  Other lack of coordination    Problem List There are no active problems to display for this patient.   Harrel Carina, MS, OTR/L 07/04/2018, 5:31 PM  [place] MAIN [facility] SERVICES [address] [place], [place], [address] Phone: [phone]   Fax:  [phone]  Name: Kenneth Summers MRN: [id number] Date of Birth: 1945-12-17

## 2018-07-04 NOTE — Therapy (Signed)
[place] MAIN [facility] SERVICES [address] [place], [place], [address] Phone: [phone]   Fax:  [phone]  Physical Therapy Treatment  Patient Details  Name: Kenneth Summers MRN: [id number] Date of Birth: October 03, 1945 Referring Provider (PT): Dr. [name], Guy Begin   Encounter Date: 07/04/2018  PT End of Session - 07/04/18 1314    Visit Number  9    Number of Visits  24    Date for PT Re-Evaluation  09/11/18    Authorization Type  Medicare reporting period from 06/12/2018    Authorization Time Period  Current cert period 0/35/4656 - 09/11/2018    PT Start Time  1315    PT Stop Time  1400    PT Time Calculation (min)  45 min    Equipment Utilized During Treatment  Gait belt    Activity Tolerance  Patient tolerated treatment well;Patient limited by fatigue    Behavior During Therapy  [facility] for tasks assessed/performed       Past Medical History:  Diagnosis Date  . Alcohol abuse   . APS (antiphospholipid syndrome) ([place])   . BPH (benign prostatic hyperplasia)   . CAD (coronary artery disease)   . Cellulitis   . Cervical spinal stenosis   . Chronic osteomyelitis ([place])   . CKD (chronic kidney disease)   . CKD (chronic kidney disease)   . Clostridium difficile diarrhea   . Depression   . Diplopia   . Fatigue   . GERD (gastroesophageal reflux disease)   . Hyperlipemia   . Hypertension   . Hypovitaminosis D   . IBS (irritable bowel syndrome)   . 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 Insomnia   . Left lumbosacral radiculopathy   . Moderate obstructive sleep apnea   . Osteomyelitis of foot ([place])   . PAD (peripheral artery disease) ([place])   . Pruritus   . RA (rheumatoid arthritis) ([place])   . Radiculopathy   . Restless legs syndrome   . RLS (restless legs syndrome)   . SLE (systemic lupus erythematosus related syndrome) ([place])   . Stroke ([place])   . Toxic maculopathy of both eyes     Past Surgical History:  Procedure Laterality Date  .  CARDIAC CATHETERIZATION    . CAROTID ENDARTERECTOMY    . CERVICAL LAMINECTOMY    . CORONARY ANGIOPLASTY    . FRACTURE SURGERY    . fusion C5-6-7    . HERNIA REPAIR    . KNEE ARTHROSCOPY    . TONSILLECTOMY      There were no vitals filed for this visit.  Subjective Assessment - 07/04/18 1313    Subjective  Pt reports a little fatigue today; he denies any pain in neck or back; Denies any discomfort after yesterday;     Patient is accompained by:  Family member   intermittantly; she thought she could not come in   Pertinent History  Patient is a 73 y.o. male who presents to outpatient physical therapy with a referral for medical diagnosis of CVA. This patient's chief complaints consist of left hemiplegia and overall deconditioning leading to the following functional deficits: dependent for ADLs, IADLs, unable to transfer to car, dependent transfers at home with hoyer lift, unable to walk or stand without significant assistance, difficulty with W/C navigation..    Limitations  Sitting;Lifting;Standing;House hold activities;Writing;Walking;Other (comment)   stairs, transferring   Diagnostic tests  Brain MRI 11/01/2017: IMPRESSION: 1. Acute right ACA territory infarct as demonstrated on prior CT  imaging. No intracranial hemorrhage or significant mass effect. 2. 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    Patient Stated Goals  wants to be able to walk again    Currently in Pain?  No/denies    Pain Onset  Today    Multiple Pain Sites  No            TREATMENT: Therapeutic Activity: Patient transferred wheelchair to mat table, using sliding board, progressed to mod A +2 for set up and transfer. Patient able to reach with RUE to assist with transfer, instructed patient to position trunk and hips in proper position prior to transfer for optimum mobility. Patient does require increased time but was able to participate more as compared to previous sessions.    Pt transitioned sit to right sidelying to hooklying with min A +2 for positioning. He was able to cross right foot under left foot and initiate LE lifting but unable to lift both legs onto mat table independently; Requires increased time and mod VCs for proper positioning and hand placement;  Following supine exercise, assisted patient in rolling toright side to prepare to sit edge of mat. He is min A +1 with cues to reach with LUE to facilitate rolling. Patient then required mod A +2 for transition sidelying to sitting with assistance to slide legs off mat table and to push through LUE to raise trunk to sitting;  At end of session, transferred patient back mat table to wheelchair, leading with strong side (R side). Mod A +2 for positioning and during transfer requiring therapist assist to block LE for better positioning; Patient able to reach with RUE to pull to wheelchair but does have significant weakness in trunk limiting slide board transfer; He requires increased time due to weakness and difficulty sliding on board.   Exercise: Hooklying with bolster under legs: PT applied NMES to LLE quad muscle, large muscle group atrophy setting, 5 sec on, 10 sec off x10 min at tolerated intensity (#[id number]) with mod VCs to increase quad muscle activation during NMES activation; Patient able to initiate quad activation but does require AAROM to achieve full SAQ ROM despite NMES assistance. He denies any pain or discomfort. He does fatigue after 5 min requiring increased cues and time to achieve quad activation; Overall he was able to exhibit better muscle activation during today's session as compared to previous session.   Hooklying: Pt instructed to hold legs together in hooklying position while therapist attached and removed NMES patches. Patient able to hold position without UE assist indicating improved LLE muscle activation and control.  -Lumbar trunk rotation AAROM x5 reps each direction with min  VCs to slow down LE movement for better stretch;   Instructed patient in PNF D2 flexion/extension AAROM with therapist resistance x10 reps with mod VCs for positioning and optimum muscle activation. Patient slow to activation muscle but was able to exhibit better muscle activation with increased repetition; Following PNF instructed patient in heel slide, he was able to complete 1 repetition with increased time and min VCs for correct exercise technique with therapist assistance to hold LLE hip into neutral position to avoid hip abduction/ER during hip flexion;  Sidelying: LLE hip abduction clamshells partial ROM x5 reps with gluteal abduction muscle twitch but minimal ROM noted;   Sitting on mat table: Instructed patient in seated LAQ x5 repetitions. He was able to exhibit partial ROM on LLE without NMES activation indicating improving motor control and muscle activation. Patient does  fatigue after 5 repetitions exhibiting decreased ROM.  Response to treatment: Patient able to exhibit better postural control and muscle activation during today's session. He followed verbal cues well with proper positioning and muscle control. Patient does fatigue in LLE quickly. Patient denies any increase in pain with advanced exercise. Inspected skin following NMES with good skin integrity without any redness or skin breakdown.                       PT Education - 07/04/18 1313    Education Details  positioning, LE strengthening, HEP reinforced;     Person(s) Educated  Patient;Child(ren)    Methods  Explanation;Verbal cues    Comprehension  Verbalized understanding;Returned demonstration;Verbal cues required;Need further instruction       PT Short Term Goals - 06/20/18 0801      PT SHORT TERM GOAL #1   Title  Be independent with initial home exercise program for self-management of symptoms.    Baseline  HEP to be given at second session; advice to practice unsupported sitting at  home (06/19/2018)    Time  2    Period  Weeks    Status  On-going    Target Date  06/26/18        PT Long Term Goals - 06/20/18 0802      PT LONG TERM GOAL #1   Title  Patient will complete all bed mobility with min A to improve functional independence for getting in and out of bed and adjusting in bed.     Baseline  max A - total A reported by family (06/19/2018);     Time  12    Period  Weeks    Status  New    Target Date  09/11/18      PT LONG TERM GOAL #2   Title  Patient will complete sit <> stand transfer chair to chair with Min A and LRAD to improve functional independence for household and community mobility.     Baseline  max A +2 STS at W/C (06/12/2018); max A +2-3 STS or stand pivot transfer, able to complete more reps (06/19/2018);     Time  12    Period  Weeks    Status  On-going    Target Date  09/11/18      PT LONG TERM GOAL #3   Title  Patient will navigate manual w/c with min A x 100 feet to improve mobility for household and short community distances.     Baseline  required total A (06/12/2018); able to roll 20 feet with min A (06/20/2018);     Time  12    Period  Weeks    Status  New    Target Date  09/11/18      PT LONG TERM GOAL #4   Title  Patient will complete car transfers W/C to family SUV with min A using LRAD to improve his abilty to participate in community activities.     Baseline  unable to complete car transfers at all (06/12/2018); unable to complete car transfers at all (06/19/2018).    Time  12    Period  Weeks    Status  On-going    Target Date  09/11/18      PT LONG TERM GOAL #5   Title  Patient will ambulate at least 150 feet with mod I using LRAD to improve mobility for household and community distances.     Baseline  unable to take steps (06/12/2018); able to shift R leg forward and back with max A and RW at edge of plinth (06/19/2018);    Time  12    Period  Weeks    Status  On-going    Target Date  09/11/18      PT LONG TERM GOAL #6    Title  Patient will navigate 4 steps with min A and BUE support to improve his ability to access community and social participation.     Baseline  unable to complet stairs (06/12/2018); unable to complete stairs (06/19/2018);    Time  12    Period  Weeks    Status  On-going    Target Date  09/11/18            Plan - 07/04/18 1415    Clinical Impression Statement  Patient highly motivated and willing to participate. He was able to exhibit improved LE muscle activation with improved quad strength during today's session. Tolerated NMES well with good skin integrity. Patient able to exhibit improved knee extension ROM following hamstring stretch. Patient and caregiver educated on proper exercise for HEP. Patient verbalized understanding. Patient able to participate more with transfers although still requires mod A +2 for slide board transfer. He would benefit from additional skilled PT Intervention to improve strength, balance and mobility;     Personal Factors and Comorbidities  Age;Comorbidity 3+    Comorbidities  Relevant past medical history and comorbidities include long term steroid use for lupus, CKD, chronic osteomyelitis, cervical pine stenosis, BPH, APS, alcohol abuse, peripheral artery disease, depression, GERD, obstructive sleep apnea, HTN, IBS, IgA deficiency, lumbar radiculopathy, RA, Stroke, toxic maculopathy in both eyes, systemic lupus erythematosus related syndrome, cardiac catheterization, carotid endarterectomy, cervical laminectomy, coronary angioplasty, Fusion C5-C7, knee arthroplasty, lumbar surgery.     Examination-Activity Limitations  Bed Mobility;Bend;Sit;Toileting;Stand;Stairs;Lift;Transfers;Squat;Locomotion Level;Carry;Dressing;Hygiene/Grooming;Continence    Examination-Participation Restrictions  Yard Work;Interpersonal Relationship;Community Activity    Stability/Clinical Decision Making  Evolving/Moderate complexity    Rehab Potential  Fair    PT Frequency  3x / week     PT Duration  12 weeks    PT Treatment/Interventions  ADLs/Self Care Home Management;Electrical Stimulation;Moist Heat;Cryotherapy;Gait training;Stair training;Functional mobility training;Therapeutic exercise;Balance training;Neuromuscular re-education;Cognitive remediation;Patient/family education;Orthotic Fit/Training;Wheelchair mobility training;Manual techniques;Passive range of motion;Energy conservation;Joint Manipulations    PT Next Visit Plan  work on functional strength and establish HEP    PT Home Exercise Plan  will establish next session    Consulted and Agree with Plan of Care  Patient;Family member/caregiver    Family Member Consulted  daugher [name]       Patient will benefit from skilled therapeutic intervention in order to improve the following deficits and impairments:  Abnormal gait, Decreased activity tolerance, Decreased cognition, Decreased endurance, Decreased knowledge of use of DME, Decreased range of motion, Decreased skin integrity, Decreased strength, Impaired perceived functional ability, Impaired sensation, Impaired UE functional use, Improper body mechanics, Pain, Cardiopulmonary status limiting activity, Decreased balance, Decreased coordination, Decreased mobility, Difficulty walking, Impaired tone, Postural dysfunction  Visit Diagnosis: Muscle weakness (generalized)  Other lack of coordination  Difficulty in walking, not elsewhere classified     Problem List There are no active problems to display for this patient.   , 07/04/2018, 2:18 PM  [place] MAIN [facility] SERVICES [address] [place], [place], [address] Phone: [phone]   Fax:  [phone]  Name: Kenneth Summers MRN: [id number] Date of Birth: [birth year]

## 2018-07-08 ENCOUNTER — Encounter: Payer: Self-pay | Admitting: Occupational Therapy

## 2018-07-08 ENCOUNTER — Ambulatory Visit: Payer: Medicare Other | Admitting: Occupational Therapy

## 2018-07-08 ENCOUNTER — Other Ambulatory Visit: Payer: Self-pay

## 2018-07-08 ENCOUNTER — Ambulatory Visit: Payer: Medicare Other | Attending: Family Medicine

## 2018-07-08 DIAGNOSIS — M6281 Muscle weakness (generalized): Secondary | ICD-10-CM | POA: Diagnosis not present

## 2018-07-08 DIAGNOSIS — R278 Other lack of coordination: Secondary | ICD-10-CM | POA: Diagnosis present

## 2018-07-08 DIAGNOSIS — R262 Difficulty in walking, not elsewhere classified: Secondary | ICD-10-CM | POA: Diagnosis present

## 2018-07-08 DIAGNOSIS — R296 Repeated falls: Secondary | ICD-10-CM | POA: Insufficient documentation

## 2018-07-08 DIAGNOSIS — I69354 Hemiplegia and hemiparesis following cerebral infarction affecting left non-dominant side: Secondary | ICD-10-CM | POA: Diagnosis present

## 2018-07-08 NOTE — Therapy (Signed)
[place] MAIN [facility] SERVICES [address] [place], [place], [address] Phone: [phone]   Fax:  [phone]  Physical Therapy Progress Note   Dates of reporting period  06/12/18    to   07/08/18  Patient Details  Name: Kenneth Summers MRN: [id number] Date of Birth: August 11, 1945 Referring Provider (PT): Dr. [name], Guy Begin   Encounter Date: 07/08/2018  PT End of Session - 07/08/18 1337    Visit Number  10    Number of Visits  24    Date for PT Re-Evaluation  09/11/18    Authorization Type  Medicare reporting period from 06/12/2018    Authorization Time Period  Current cert period 0/86/7619 - 09/11/2018    PT Start Time  1330    PT Stop Time  1415    PT Time Calculation (min)  45 min    Equipment Utilized During Treatment  Gait belt    Activity Tolerance  Patient tolerated treatment well;Patient limited by fatigue    Behavior During Therapy  [facility] for tasks assessed/performed       Past Medical History:  Diagnosis Date  . Alcohol abuse   . APS (antiphospholipid syndrome) ([place])   . BPH (benign prostatic hyperplasia)   . CAD (coronary artery disease)   . Cellulitis   . Cervical spinal stenosis   . Chronic osteomyelitis ([place])   . CKD (chronic kidney disease)   . CKD (chronic kidney disease)   . Clostridium difficile diarrhea   . Depression   . Diplopia   . Fatigue   . GERD (gastroesophageal reflux disease)   . Hyperlipemia   . Hypertension   . Hypovitaminosis D   . IBS (irritable bowel syndrome)   . 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 Insomnia   . Left lumbosacral radiculopathy   . Moderate obstructive sleep apnea   . Osteomyelitis of foot ([place])   . PAD (peripheral artery disease) ([place])   . Pruritus   . RA (rheumatoid arthritis) ([place])   . Radiculopathy   . Restless legs syndrome   . RLS (restless legs syndrome)   . SLE (systemic lupus erythematosus related syndrome) ([place])   . Stroke ([place])   . Toxic maculopathy of both eyes      Past Surgical History:  Procedure Laterality Date  . CARDIAC CATHETERIZATION    . CAROTID ENDARTERECTOMY    . CERVICAL LAMINECTOMY    . CORONARY ANGIOPLASTY    . FRACTURE SURGERY    . fusion C5-6-7    . HERNIA REPAIR    . KNEE ARTHROSCOPY    . TONSILLECTOMY      There were no vitals filed for this visit.  Subjective Assessment - 07/08/18 1329    Subjective  Pt reports a little weakness this morning.  He is having some low back/hip pain today. A little soreness after the last therapy session but overall he is doing well. No specific questions or concerns at this time.     Patient is accompained by:  Family member   intermittantly; she thought she could not come in   Pertinent History  Patient is a 73 y.o. male who presents to outpatient physical therapy with a referral for medical diagnosis of CVA. This patient's chief complaints consist of left hemiplegia and overall deconditioning leading to the following functional deficits: dependent for ADLs, IADLs, unable to transfer to car, dependent transfers at home with hoyer lift, unable to walk or stand without significant assistance, difficulty with  W/C navigation..    Limitations  Sitting;Lifting;Standing;House hold activities;Writing;Walking;Other (comment)   stairs, transferring   Diagnostic tests  Brain MRI 11/01/2017: IMPRESSION: 1. Acute right ACA territory infarct as demonstrated on prior CT imaging. No intracranial hemorrhage or significant mass effect. 2. 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    Patient Stated Goals  wants to be able to walk again    Currently in Pain?  Yes    Pain Score  5     Pain Location  Hip    Pain Orientation  Right;Left    Pain Descriptors / Indicators  Aching;Sore    Pain Type  Acute pain    Pain Onset  In the past 7 days    Pain Frequency  Intermittent    Multiple Pain Sites  No          TREATMENT:  Therapeutic Activity Patient transferred wheelchair  to mat table toward his strong, R side, using sliding board,progressed to modA +2 for set up and transfer. Patient able to reach with RUE to assist with transfer, instructed patient to position trunk and hips in proper position prior to transfer for optimum mobility.   Following supine exercise, assisted patient in rolling toleft side to prepare to sit edge of mat. He is min A +1 with cues to reach with RUE to facilitate rolling. Performed L rolling 5 times with patient. Patient then required mod A +2 for transition sidelying to sitting with assistance to slide legs off mat table and to push through RUE to raise trunk to sitting;  At end of session, transferred patient back mat table to wheelchair, leading with strong side (R side) using sliding board.ModA +2 for positioning and during transfer requiring therapist assist to block LE for better positioning; Patient able to reach with RUE to pull to wheelchair but does have significant weakness in trunk limiting slide board transfer; He requires increased time due to weakness and difficulty sliding on board.   Estim Attended Hooklying with bolster under legs: PT applied NMES to L quad muscle, large muscle group atrophy setting, 5 sec on, 10 sec off x 10 min at tolerated intensity (#40-42) with mod VCs to increase quad muscle activation during NMES activation; Patient able to initiate quad activation but does require AAROM to achieve full SAQ ROM despite NMES assistance. He denies any pain or discomfort. He does demonstrate fatigue as well as distraction and requires frequent redirection.    Ther-ex  Hooklying: LLE hip abduction/adduction AAROM with assist to keep LLE off mat table 2 x 5; LLE heel slides with manually resisted extension 2 x 5; Hooklying clams with manual resistance 2 x 5, with AROM adduction against gravity; Instructed patient in PNF D2 flexion/extension AAROM with therapist resistance x 5 reps with mod VCs for positioning and  optimum muscle activation.  Sitting on mat table with BUE in lap working on upright posture in mirror. Therapist provided gentle challenge in all directions with cues for pt to resist movement for trunk stability training;   Response to treatment: Patient able to exhibit better postural control and muscle activation during today's session. He followed verbal cues well with proper positioning and muscle control.Patient denies any increase in pain with advanced exercise. Inspected skin following NMES with good skin integrity without any redness or skin breakdown.   Pt is making good progress with therapy. Per family he is demonstrating improved ease with rolling at home. He demonstrates improving sitting balance today and  is able to tolerate some manual challenge from therapist. He is still unsafe to attempt sit to stand transfers or ambulation. He continues to require frequent redirection during session to focus on task at hand due to being easily distracted and taking extended time to process commands. Pt will benefit from PT services to address deficits in strength, balance, and mobility in order to return to full function at home.                     PT Short Term Goals - 07/08/18 1405      PT SHORT TERM GOAL #1   Title  Be independent with initial home exercise program for self-management of symptoms.    Baseline  HEP to be given at second session; advice to practice unsupported sitting at home (06/19/2018); 07/08/18: Performing at home    Time  2    Period  Weeks    Status  On-going    Target Date  06/26/18        PT Long Term Goals - 07/08/18 1405      PT LONG TERM GOAL #1   Title  Patient will complete all bed mobility with min A to improve functional independence for getting in and out of bed and adjusting in bed.     Baseline  max A - total A reported by family (06/19/2018); 07/08/18: still requires maxA, dtr reports improvement in rolling since starting therapy     Time  12    Period  Weeks    Status  On-going    Target Date  09/11/18      PT LONG TERM GOAL #2   Title  Patient will complete sit <> stand transfer chair to chair with Min A and LRAD to improve functional independence for household and community mobility.     Baseline  max A +2 STS at W/C (06/12/2018); max A +2-3 STS or stand pivot transfer, able to complete more reps (06/19/2018); 07/08/18: not safe to attempt at this time    Time  12    Period  Weeks    Status  On-going    Target Date  09/11/18      PT LONG TERM GOAL #3   Title  Patient will navigate manual w/c with min A x 100 feet to improve mobility for household and short community distances.     Baseline  required total A (06/12/2018); able to roll 20 feet with min A (06/20/2018); 07/08/18: Pt can go approximately 10' without assist per family;    Time  12    Period  Weeks    Status  On-going    Target Date  09/11/18      PT LONG TERM GOAL #4   Title  Patient will complete car transfers W/C to family SUV with min A using LRAD to improve his abilty to participate in community activities.     Baseline  unable to complete car transfers at all (06/12/2018); unable to complete car transfers at all (06/19/2018).; 07/08/18: Unable to perform at this time    Time  12    Period  Weeks    Status  On-going    Target Date  09/11/18      PT LONG TERM GOAL #5   Title  Patient will ambulate at least 150 feet with mod I using LRAD to improve mobility for household and community distances.     Baseline  unable to take steps (06/12/2018); able to  shift R leg forward and back with max A and RW at edge of plinth (06/19/2018); 07/08/18: unable to ambulate at this time.     Time  12    Period  Weeks    Status  On-going    Target Date  09/11/18      PT LONG TERM GOAL #6   Title  Patient will navigate 4 steps with min A and BUE support to improve his ability to access community and social participation.     Baseline  unable to complet stairs (06/12/2018); unable  to complete stairs (06/19/2018); 07/08/18: unable to attempt at this time    Time  12    Period  Weeks    Status  On-going    Target Date  09/11/18            Plan - 07/08/18 1337    Clinical Impression Statement  Pt is making good progress with therapy. Per family he is demonstrating improved ease with rolling at home. He demonstrates improving sitting balance today and is able to tolerate some manual challenge from therapist. He is still unsafe to attempt sit to stand transfers or ambulation. He continues to require frequent redirection during session to focus on task at hand due to being easily distracted and taking extended time to process commands. Pt will benefit from PT services to address deficits in strength, balance, and mobility in order to return to full function at home.     Personal Factors and Comorbidities  Age;Comorbidity 3+    Comorbidities  Relevant past medical history and comorbidities include long term steroid use for lupus, CKD, chronic osteomyelitis, cervical pine stenosis, BPH, APS, alcohol abuse, peripheral artery disease, depression, GERD, obstructive sleep apnea, HTN, IBS, IgA deficiency, lumbar radiculopathy, RA, Stroke, toxic maculopathy in both eyes, systemic lupus erythematosus related syndrome, cardiac catheterization, carotid endarterectomy, cervical laminectomy, coronary angioplasty, Fusion C5-C7, knee arthroplasty, lumbar surgery.     Examination-Activity Limitations  Bed Mobility;Bend;Sit;Toileting;Stand;Stairs;Lift;Transfers;Squat;Locomotion Level;Carry;Dressing;Hygiene/Grooming;Continence    Examination-Participation Restrictions  Yard Work;Interpersonal Relationship;Community Activity    Stability/Clinical Decision Making  Evolving/Moderate complexity    Rehab Potential  Fair    PT Frequency  3x / week    PT Duration  12 weeks    PT Treatment/Interventions  ADLs/Self Care Home Management;Electrical Stimulation;Moist Heat;Cryotherapy;Gait training;Stair  training;Functional mobility training;Therapeutic exercise;Balance training;Neuromuscular re-education;Cognitive remediation;Patient/family education;Orthotic Fit/Training;Wheelchair mobility training;Manual techniques;Passive range of motion;Energy conservation;Joint Manipulations    PT Next Visit Plan  work on functional strength and establish HEP    PT Home Exercise Plan  will establish next session    Consulted and Agree with Plan of Care  Patient;Family member/caregiver    Family Member Consulted  daugher [name]       Patient will benefit from skilled therapeutic intervention in order to improve the following deficits and impairments:  Abnormal gait, Decreased activity tolerance, Decreased cognition, Decreased endurance, Decreased knowledge of use of DME, Decreased range of motion, Decreased skin integrity, Decreased strength, Impaired perceived functional ability, Impaired sensation, Impaired UE functional use, Improper body mechanics, Pain, Cardiopulmonary status limiting activity, Decreased balance, Decreased coordination, Decreased mobility, Difficulty walking, Impaired tone, Postural dysfunction  Visit Diagnosis: Muscle weakness (generalized)  Other lack of coordination  Difficulty in walking, not elsewhere classified     Problem List There are no active problems to display for this patient.  Lyndel Safe Huprich PT, DPT, GCS  Huprich,Jason 07/08/2018, 5:06 PM  [place] [facility] SERVICES [place],  [place], [address] Phone: [phone]   Fax:  [phone]  Name: STEFANO TRULSON MRN: [id number] Date of Birth: 1945-09-30

## 2018-07-08 NOTE — Therapy (Signed)
[place] MAIN [facility] SERVICES [address] [place], [place], [address] Phone: [phone]   Fax:  [phone]  Occupational Therapy Treatment  Patient Details  Name: Kenneth Summers MRN: [id number] Date of Birth: 1945/10/20 No data recorded  Encounter Date: 07/08/2018  OT End of Session - 07/08/18 1723    Visit Number  9    Number of Visits  24    Date for OT Re-Evaluation  09/04/18    Authorization Type  Progress report period starting 06/12/2018    OT Start Time  1430    OT Stop Time  1515    OT Time Calculation (min)  45 min    Activity Tolerance  Patient tolerated treatment well    Behavior During Therapy  [facility] for tasks assessed/performed       Past Medical History:  Diagnosis Date  . Alcohol abuse   . APS (antiphospholipid syndrome) ([place])   . BPH (benign prostatic hyperplasia)   . CAD (coronary artery disease)   . Cellulitis   . Cervical spinal stenosis   . Chronic osteomyelitis ([place])   . CKD (chronic kidney disease)   . CKD (chronic kidney disease)   . Clostridium difficile diarrhea   . Depression   . Diplopia   . Fatigue   . GERD (gastroesophageal reflux disease)   . Hyperlipemia   . Hypertension   . Hypovitaminosis D   . IBS (irritable bowel syndrome)   . IgA deficiency ([place])   . Insomnia   . Left lumbosacral radiculopathy   . Moderate obstructive sleep apnea   . Osteomyelitis of foot ([place])   . PAD (peripheral artery disease) ([place])   . Pruritus   . RA (rheumatoid arthritis) ([place])   . Radiculopathy   . Restless legs syndrome   . RLS (restless legs syndrome)   . SLE (systemic lupus erythematosus related syndrome) ([place])   . Stroke ([place])   . Toxic maculopathy of both eyes     Past Surgical History:  Procedure Laterality Date  . CARDIAC CATHETERIZATION    . CAROTID ENDARTERECTOMY    . CERVICAL LAMINECTOMY    . CORONARY ANGIOPLASTY    . FRACTURE SURGERY    . fusion C5-6-7    . HERNIA REPAIR    . KNEE  ARTHROSCOPY    . TONSILLECTOMY      There were no vitals filed for this visit.  Subjective Assessment - 07/08/18 1721    Subjective   Pt.'s daughter was present duirng the session.    Pertinent History  Pt. is a 73 y.o. 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 Pt. PMHx includes: Multiple Falls, Lupus, DJD, Renal Abscess, CVA. Pt. resides with his wife. Pt.'s wife and daughter assist with ADLs. Pt. has caregivers in  for 2 hours a day, 6 days a week. Pt. received Rehab services in acute care, at SNF for STR, and [place] services. Pt. is retired from [employer] for [employer] and [employer].    Patient Stated Goals  To regain use of his left UE, and do more for himself.    Currently in Pain?  Yes    Pain Score  2     Pain Location  Shoulder    Pain Orientation  Right;Left    Pain Descriptors / Indicators  Aching;Sore    Pain Type  Chronic pain      OT TREATMENT    Neuro muscular re-education:  Pt. worked on grasping coins from a tabletop surface with his left hand, placing them into a resistive container, and pushing them through the slot while isolating his 2nd digit. Pt. worked on counting various change, and coin amounts. Pt. attempted to grasp, and store the coins in the palm of his left hand with cues to use his left hand.  Therapeutic Exercise:  Pt. performed 2# dowel ex. For BUE strengthening secondary to weakness. Bilateral shoulder flexion, chest press, circular patterns, and elbow flexion/extension were performed. 1-2 sets 10 reps each with cues to keep the dowel straight. 2# dumbbell ex. for left elbow flexion and extension, forearm supination/pronation, wrist flexion/extension, and radial deviation. Pt. requires rest breaks and verbal cues for proper technique. Pt. worked on pinch strengthening in the left hand for lateral, and 3pt. pinch using yellow, red, and green resistive clips. Pt. worked on placing the clips at various vertical and  horizontal angles. Tactile and verbal cues were required for eliciting the desired movement.   Response to treatment:   Pt. required increased verbal, and visual cues to use his left hand during the coin task. Pt. Required fewer cues for left sided awareness with his left hand.                        OT Education - 07/08/18 1723    Education Details  LE dressing skills    Person(s) Educated  Patient;Child(ren)    Methods  Explanation    Comprehension  Verbalized understanding;Returned demonstration          OT Long Term Goals - 06/12/18 1701      OT LONG TERM GOAL #1   Title  Pt. will increase left shoulder flexion AROM by 10 degrees to access cabinet/shelf.    Baseline  Eval: Left shoulder flexion 70(96)    Time  12    Period  Weeks    Status  New    Target Date  09/04/18      OT LONG TERM GOAL #2   Title  Pt. will donn a shirt with Supervision.    Baseline  Eval: ModA    Time  12    Period  Weeks    Status  New    Target Date  09/02/18      OT LONG TERM GOAL #3   Title  Pt. will require ModA to perform LE dressing    Baseline  Eval: Total    Time  12    Period  Weeks    Status  New    Target Date  09/04/18      OT LONG TERM GOAL #4   Title  Pt. will improve left grip strength by 10# to be able to open a jar/container.    Baseline  Eval:  Left: 25, Right 40    Time  12    Period  Weeks    Target Date  09/04/18      OT LONG TERM GOAL #5   Title  Pt. will improve left hand [facility] skills to be able to assit with buttoning/zipping.    Baseline  Eval: Left: 2 min. & 12 sec., R: 1 min. & 8 sec.    Time  12    Period  Weeks    Status  New    Target Date  09/04/18      Long Term Additional Goals   Additional Long Term Goals  Yes  OT LONG TERM GOAL #6   Title  Pt. will demonstrate visual conmpensatory strategies during 100% of the time during self care.    Baseline  Eval: Diplopia. Unable    Time  12    Period  Weeks    Status  New     Target Date  09/04/18      OT LONG TERM GOAL #7   Title  Pt. will prepare a simple cold snack from w/c with supervision using cognitive compensatory strategies 100% of the time.    Baseline  Eval: unable    Time  12    Period  Weeks    Status  New    Target Date  09/04/18      OT LONG TERM GOAL #8   Title  Pt. will accurately identify potential saeftey hazard using good safety awareness, and judgement 100% for ADLs, and IADLs.    Baseline  Eval: unable    Time  12    Period  Weeks    Status  New    Target Date  09/04/18            Plan - 07/08/18 1724    Clinical Impression Statement  Pt. has been trying to do more at home with engaging his LUE in daily ADL, and IADL tasks at home. Pt.;'s daughter reports his attention, and concentration has improved. Pt. Pt. presents with improved attention during the session, improved left sided awareness, improved problem solving  skills, improved LUE ROM, strength,  motor control, and coordination skills.     Occupational performance deficits (Please refer to evaluation for details):  ADL's    Body Structure / Function / Physical Skills  ADL;Coordination;Balance;IADL;Pain;FMC;ROM;Tone;Proprioception;Decreased knowledge of use of DME;Vision;Strength;UE functional use    Cognitive Skills  Attention;Memory;Emotional;Problem Solve;Safety Awareness    Clinical Decision Making  Multiple treatment options, significant modification of task necessary    Comorbidities Affecting Occupational Performance:  Presence of comorbidities impacting occupational performance    Comorbidities impacting occupational performance description:  Phyical, cognitive, visual,  medical comorbidities    Modification or Assistance to Complete Evaluation   Max significant modification of tasks or assist is necessary to complete    OT Frequency  3x / week    OT Duration  12 weeks    OT Treatment/Interventions  Self-care/ADL training;DME and/or AE instruction;Therapeutic  exercise;Therapeutic activities;Moist Heat;Cognitive remediation/compensation;Neuromuscular education;Visual/perceptual remediation/compensation;Coping strategies training;Patient/family education;Passive range of motion;Psychosocial skills training;Energy conservation;Functional Mobility Training    Consulted and Agree with Plan of Care  Patient;Family member/caregiver    Family Member Consulted  Daughter [name]       Patient will benefit from skilled therapeutic intervention in order to improve the following deficits and impairments:  Cognitive Skills, Body Structure / Function / Physical Skills  Visit Diagnosis: Muscle weakness (generalized)  Other lack of coordination    Problem List There are no active problems to display for this patient.   Harrel Carina, MS, OTR/L 07/08/2018, 5:32 PM  [place] MAIN [facility] SERVICES [address] [place], [place], [address] Phone: [phone]   Fax:  [phone]  Name: Kenneth Summers MRN: [id number] Date of Birth: Mar 30, 1945

## 2018-07-10 ENCOUNTER — Other Ambulatory Visit: Payer: Self-pay

## 2018-07-10 ENCOUNTER — Ambulatory Visit: Payer: Medicare Other | Admitting: Occupational Therapy

## 2018-07-10 ENCOUNTER — Ambulatory Visit: Payer: Medicare Other

## 2018-07-10 ENCOUNTER — Encounter: Payer: Self-pay | Admitting: Occupational Therapy

## 2018-07-10 DIAGNOSIS — R278 Other lack of coordination: Secondary | ICD-10-CM

## 2018-07-10 DIAGNOSIS — M6281 Muscle weakness (generalized): Secondary | ICD-10-CM

## 2018-07-10 DIAGNOSIS — I69354 Hemiplegia and hemiparesis following cerebral infarction affecting left non-dominant side: Secondary | ICD-10-CM

## 2018-07-10 DIAGNOSIS — R262 Difficulty in walking, not elsewhere classified: Secondary | ICD-10-CM

## 2018-07-10 NOTE — Therapy (Addendum)
[place] MAIN [facility] SERVICES [address] [place], [place], [address] Phone: [phone]   Fax:  [phone]  Occupational Therapy Progress Note  Dates of reporting period  06/12/2018   to   07/10/2018  Patient Details  Name: Kenneth Summers MRN: [id number] Date of Birth: 01/31/1946 No data recorded  Encounter Date: 07/10/2018  OT End of Session - 07/10/18 1629    Visit Number  10    Number of Visits  24    Date for OT Re-Evaluation  09/04/18    Authorization Type  Progress report period starting 06/12/2018    OT Start Time  1420    OT Stop Time  1505    OT Time Calculation (min)  45 min    Activity Tolerance  Patient tolerated treatment well    Behavior During Therapy  [facility] for tasks assessed/performed       Past Medical History:  Diagnosis Date  . Alcohol abuse   . APS (antiphospholipid syndrome) ([place])   . BPH (benign prostatic hyperplasia)   . CAD (coronary artery disease)   . Cellulitis   . Cervical spinal stenosis   . Chronic osteomyelitis ([place])   . CKD (chronic kidney disease)   . CKD (chronic kidney disease)   . Clostridium difficile diarrhea   . Depression   . Diplopia   . Fatigue   . GERD (gastroesophageal reflux disease)   . Hyperlipemia   . Hypertension   . Hypovitaminosis D   . IBS (irritable bowel syndrome)   . 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 Insomnia   . Left lumbosacral radiculopathy   . Moderate obstructive sleep apnea   . Osteomyelitis of foot ([place])   . PAD (peripheral artery disease) ([place])   . Pruritus   . RA (rheumatoid arthritis) ([place])   . Radiculopathy   . Restless legs syndrome   . RLS (restless legs syndrome)   . SLE (systemic lupus erythematosus related syndrome) ([place])   . Stroke ([place])   . Toxic maculopathy of both eyes     Past Surgical History:  Procedure Laterality Date  . CARDIAC CATHETERIZATION    . CAROTID ENDARTERECTOMY    . CERVICAL LAMINECTOMY    . CORONARY ANGIOPLASTY    . FRACTURE  SURGERY    . fusion C5-6-7    . HERNIA REPAIR    . KNEE ARTHROSCOPY    . TONSILLECTOMY      There were no vitals filed for this visit.  Subjective Assessment - 07/10/18 1615    Subjective   Pt.'s daughter was present during the session.    Pertinent History  Pt. is a 73 y.o. 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 Pt. PMHx includes: Multiple Falls, Lupus, DJD, Renal Abscess, CVA. Pt. resides with his wife. Pt.'s wife and daughter assist with ADLs. Pt. has caregivers in  for 2 hours a day, 6 days a week. Pt. received Rehab services in acute care, at SNF for STR, and [place] services. Pt. is retired from [employer] for [employer] and [employer].    Patient Stated Goals  To regain use of his left UE, and do more for himself.    Currently in Pain?  No/denies      OT TREATMENT    Measurements were obtained.   Therapeutic Exercise:  Pt. performed 3# dowel ex. for UE strengthening secondary to weakness. Bilateral shoulder flexion, chest press, circular patterns, and elbow flexion/extension  were performed. 2 sets 10 reps each with cues.       [facility] OT Assessment - 07/10/18 1734      Coordination   Right 9 Hole Peg Test  56 sec.    Left 9 Hole Peg Test  1 min. & 52 sec.      AROM   Overall AROM Comments  Shoulder flexion 104, Abduction: 76, Elbow -10      Strength   Overall Strength Comments  LUE strength: 3+/5      Hand Function   Left Hand Grip (lbs)  29    Left Hand Lateral Pinch  10 lbs    Left 3 point pinch  10 lbs                       OT Education - 07/10/18 1629    Education Details  LE dressing skills    Person(s) Educated  Patient;Child(ren)    Methods  Explanation    Comprehension  Verbalized understanding;Returned demonstration          OT Long Term Goals - 07/10/18 1638      OT LONG TERM GOAL #1   Title  Pt. will increase left shoulder flexion AROM by 10 degrees to access cabinet/shelf.     Baseline  07/10/2018: AROM left shoulder flexion: 104    Time  12    Period  Weeks    Status  On-going    Target Date  09/04/18      OT LONG TERM GOAL #2   Title  Pt. will donn a shirt with Supervision.    Baseline  07/10/2018: Min-ModA    Time  12    Period  Weeks    Status  New    Target Date  09/04/18      OT LONG TERM GOAL #3   Title  Pt. will require ModA to perform LE dressing    Baseline  Eval: MaxA left, ModA with the right    Time  12    Period  Weeks    Status  On-going    Target Date  09/04/18      OT LONG TERM GOAL #4   Title  Pt. will improve left grip strength by 10# to be able to open a jar/container.    Baseline  07/10/2018: Left: 29, Right 40    Time  12    Period  Weeks    Status  On-going    Target Date  09/04/18      OT LONG TERM GOAL #5   Title  Pt. will improve left hand [facility] skills to be able to assit with buttoning/zipping.    Baseline  Eval: Left: 1 min & 52 sec. R: 56 sec.    Time  12    Period  Weeks    Status  New    Target Date  09/04/18      OT LONG TERM GOAL #6   Title  Pt. will demonstrate visual conmpensatory strategies during 100% of the time during self care.    Baseline  Continue    Time  12    Period  Weeks    Status  On-going    Target Date  09/04/18      OT LONG TERM GOAL #7   Title  Pt. will prepare a simple cold snack from w/c with supervision using cognitive compensatory strategies 100% of the time.    Baseline  Pt. is  unable    Time  12    Period  Weeks    Status  On-going    Target Date  09/04/18      OT LONG TERM GOAL #8   Title  Pt. will accurately identify potential safety hazard using good safety awareness, and judgement 100% for ADLs, and IADLs.    Baseline  Continue    Time  12    Period  Weeks    Status  On-going    Target Date  09/04/18            Plan - 07/10/18 1630    Clinical Impression Statement Pt. has made progress overall with LUE strength, and [place] skills. Pt. has improved with Left shoulder  ROM, left UE strength, improved left grip strength, pinch strength, and [place] skills. Pt. is now able to propel his w/c engaging Bilateral UEs, and his RLE. Pt. is engaging his LUE into UE, and LE ADLs.  Pt. continues to work on improving UE strength, and [facility] skills in order to improve and maximize independence with ADLs, and IADLs.       Occupational performance deficits (Please refer to evaluation for details):  ADL's    Body Structure / Function / Physical Skills  UE functional use    Cognitive Skills  Attention;Memory;Emotional;Problem Solve;Safety Awareness    Clinical Decision Making  Multiple treatment options, significant modification of task necessary    Comorbidities Affecting Occupational Performance:  Presence of comorbidities impacting occupational performance    Comorbidities impacting occupational performance description:  Phyical, cognitive, visual,  medical comorbidities    Modification or Assistance to Complete Evaluation   Max significant modification of tasks or assist is necessary to complete    OT Frequency  3x / week    OT Duration  12 weeks    OT Treatment/Interventions  Self-care/ADL training;DME and/or AE instruction;Therapeutic exercise;Therapeutic activities;Moist Heat;Cognitive remediation/compensation;Neuromuscular education;Visual/perceptual remediation/compensation;Coping strategies training;Patient/family education;Passive range of motion;Psychosocial skills training;Energy conservation;Functional Mobility Training    Recommended Other Services  PT    Consulted and Agree with Plan of Care  Patient;Family member/caregiver    Family Member Consulted  Daughter [name]       Patient will benefit from skilled therapeutic intervention in order to improve the following deficits and impairments:     Visit Diagnosis: Muscle weakness (generalized)  Other lack of coordination    Problem List There are no active problems to display for this patient.   Harrel Carina,  MS, OTR/L 07/10/2018, 5:41 PM  [place] MAIN [facility] SERVICES [address] [place], [place], [address] Phone: [phone]   Fax:  [phone]  Name: Kenneth Summers MRN: [id number] Date of Birth: 04/25/1945

## 2018-07-10 NOTE — Therapy (Signed)
Kenneth Summers [facility] SERVICES [address] [place], [place], [address] Phone: [phone]   Fax:  [phone]  Physical Therapy Treatment  Patient Details  Name: Kenneth Summers MRN: [id number] Date of Birth: 1945/10/05 Referring Provider (PT): Dr. Kym Summers, Kenneth Summers   Encounter Date: 07/10/2018  PT End of Session - 07/10/18 1435    Visit Number  11    Number of Visits  24    Date for PT Re-Evaluation  09/11/18    Authorization Type  Medicare reporting period from 06/12/2018    Authorization Time Period  Current cert period 10/30/2681 - 09/11/2018    PT Start Time  1330    PT Stop Time  1416    PT Time Calculation (min)  46 min    Equipment Utilized During Treatment  Gait belt    Activity Tolerance  Patient tolerated treatment well;Patient limited by fatigue    Behavior During Therapy  [facility] for tasks assessed/performed       Past Medical History:  Diagnosis Date  . Alcohol abuse   . APS (antiphospholipid syndrome) ([place])   . BPH (benign prostatic hyperplasia)   . CAD (coronary artery disease)   . Cellulitis   . Cervical spinal stenosis   . Chronic osteomyelitis ([place])   . CKD (chronic kidney disease)   . CKD (chronic kidney disease)   . Clostridium difficile diarrhea   . Depression   . Diplopia   . Fatigue   . GERD (gastroesophageal reflux disease)   . Hyperlipemia   . Hypertension   . Hypovitaminosis D   . IBS (irritable bowel syndrome)   . IgA deficiency ([place])   . Insomnia   . Left lumbosacral radiculopathy   . Moderate obstructive sleep apnea   . Osteomyelitis of foot ([place])   . PAD (peripheral artery disease) ([place])   . Pruritus   . RA (rheumatoid arthritis) ([place])   . Radiculopathy   . Restless legs syndrome   . RLS (restless legs syndrome)   . SLE (systemic lupus erythematosus related syndrome) ([place])   . Stroke ([place])   . Toxic maculopathy of both eyes     Past Surgical History:  Procedure Laterality Date  .  CARDIAC CATHETERIZATION    . CAROTID ENDARTERECTOMY    . CERVICAL LAMINECTOMY    . CORONARY ANGIOPLASTY    . FRACTURE SURGERY    . fusion C5-6-7    . HERNIA REPAIR    . KNEE ARTHROSCOPY    . TONSILLECTOMY      There were no vitals filed for this visit.  Subjective Assessment - 07/10/18 1434    Subjective  Pt reports feeling fatigued today upon arrival. He is not having any back or hip pain currently. No specific questions or concerns at this time.     Patient is accompained by:  Family member   intermittantly; she thought she could not come in   Pertinent History  Patient is a 73 y.o. male who presents to outpatient physical therapy with a referral for medical diagnosis of CVA. This patient's chief complaints consist of left hemiplegia and overall deconditioning leading to the following functional deficits: dependent for ADLs, IADLs, unable to transfer to car, dependent transfers at home with hoyer lift, unable to walk or stand without significant assistance, difficulty with W/C navigation..    Limitations  Sitting;Lifting;Standing;House hold activities;Writing;Walking;Other (comment)   stairs, transferring   Diagnostic tests  Brain MRI 11/01/2017: IMPRESSION: 1. Acute right ACA territory  infarct as demonstrated on prior CT imaging. No intracranial hemorrhage or significant mass effect. 2. 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    Patient Stated Goals  wants to be able to walk again    Currently in Pain?  No/denies           TREATMENT   Therapeutic Activity Patient transferred wheelchair to mat table toward his strong, R side, using sliding board,progressed to modA +2 for set up and transfer. Patient able to reach with RUE to assist with transfer, instructed patient to position trunk and hips in proper position prior to transfer for optimum mobility.   Following supine exercise, assisted patient in rolling torightside to prepare to sit  edge of mat. He ismin A +1with cues to reach with LUE to facilitate rolling. Patient then required modA +1 for transition sidelying to sitting with assistance to slide legs off mat table and to push through BUE to raise trunk to sitting;  At end of session, transferred patient back mat table to wheelchair, leading with strong side (R side) using sliding board.ModA +2 for positioning and during transfer requiring therapist assist to block LE for better positioning; Patient able to reach with RUE to pull to wheelchair but does have significant weakness in trunk limiting slide board transfer; He requires increased time due to weakness and difficulty sliding on board.   Estim Attended Hooklying with bolster under legs: PT applied NMES to L quad muscle, large muscle group atrophy setting, 10 sec on, 10 sec off x 10 min at tolerated intensity (#[id number]) with mod VCs to increase quad muscle activation during NMES activation; Patient able to initiate quad activation but does require AAROM to achieve full SAQ ROM despite NMES assistance. He denies any pain or discomfort. He does demonstrate increased fatigue as well as increased distraction today and requires frequent redirection.    Ther-ex  Hooklying: LLE hip abduction/adduction AAROM with assist to keep LLE off mat table 2 x 5; LLE heel slides with manually resisted extension 2 x 5; Hooklying clams with manual resistance x 10, with AROM adduction against gravity; Hooklying resisted hooklying lumbar rotation with therapist providing challenge at the knees 3s hold x 5 each direction; Supine manually resisted shoulder protraction 3s hold, extensive cues for activation, x 5 each direction; Sitting on mat table without UE support working on upright posture and R/L weight shifting. Tactile cues and practice for anterior pelvic tilting in sitting x 8. Therapist provided gentle challenge in all directions with cues for pt to resist movement for trunk  stability training; Low squat pivot standing with extensive cues for anterior weight shifting while maintaining anterior pelvic tilt. Performed x 3 with patient gradually increasing the height of clearance from the table. Therapist providing blocking at both knees throughout.    Response to treatment: Patient able to exhibit better postural control and muscle activation during today's session. He followed verbal cues well with proper positioning and muscle control.Patient denies any increase in pain with advanced exercise. Inspected skin following NMES with good skin integrity without any redness or skin breakdown.   Pt is making good progress with therapy. He is more fatigued and distracted today during supine/hooklying exercises however demonstrates better sitting posture and endurance. He is able to perform some low squat pivot stands with heavy assist from therapist and blocking at bilateral knees. He is still unsafe to attempt full sit to stand transfers or ambulation. Pt will benefit from PT services to address  deficits in strength, balance, and mobility in order to return to full function at home.                       PT Short Term Goals - 07/08/18 1405      PT SHORT TERM GOAL #1   Title  Be independent with initial home exercise program for self-management of symptoms.    Baseline  HEP to be given at second session; advice to practice unsupported sitting at home (06/19/2018); 07/08/18: Performing at home    Time  2    Period  Weeks    Status  On-going    Target Date  06/26/18        PT Long Term Goals - 07/08/18 1405      PT LONG TERM GOAL #1   Title  Patient will complete all bed mobility with min A to improve functional independence for getting in and out of bed and adjusting in bed.     Baseline  max A - total A reported by family (06/19/2018); 07/08/18: still requires maxA, dtr reports improvement in rolling since starting therapy    Time  12    Period   Weeks    Status  On-going    Target Date  09/11/18      PT LONG TERM GOAL #2   Title  Patient will complete sit <> stand transfer chair to chair with Min A and LRAD to improve functional independence for household and community mobility.     Baseline  max A +2 STS at W/C (06/12/2018); max A +2-3 STS or stand pivot transfer, able to complete more reps (06/19/2018); 07/08/18: not safe to attempt at this time    Time  12    Period  Weeks    Status  On-going    Target Date  09/11/18      PT LONG TERM GOAL #3   Title  Patient will navigate manual w/c with min A x 100 feet to improve mobility for household and short community distances.     Baseline  required total A (06/12/2018); able to roll 20 feet with min A (06/20/2018); 07/08/18: Pt can go approximately 10' without assist per family;    Time  12    Period  Weeks    Status  On-going    Target Date  09/11/18      PT LONG TERM GOAL #4   Title  Patient will complete car transfers W/C to family SUV with min A using LRAD to improve his abilty to participate in community activities.     Baseline  unable to complete car transfers at all (06/12/2018); unable to complete car transfers at all (06/19/2018).; 07/08/18: Unable to perform at this time    Time  12    Period  Weeks    Status  On-going    Target Date  09/11/18      PT LONG TERM GOAL #5   Title  Patient will ambulate at least 150 feet with mod I using LRAD to improve mobility for household and community distances.     Baseline  unable to take steps (06/12/2018); able to shift R leg forward and back with max A and RW at edge of plinth (06/19/2018); 07/08/18: unable to ambulate at this time.     Time  12    Period  Weeks    Status  On-going    Target Date  09/11/18  PT LONG TERM GOAL #6   Title  Patient will navigate 4 steps with min A and BUE support to improve his ability to access community and social participation.     Baseline  unable to complet stairs (06/12/2018); unable to complete stairs  (06/19/2018); 07/08/18: unable to attempt at this time    Time  12    Period  Weeks    Status  On-going    Target Date  09/11/18            Plan - 07/10/18 1436    Clinical Impression Statement  Pt is making good progress with therapy. He is more fatigued and distracted today during supine/hooklying exercises however demonstrates better sitting posture and endurance. He is able to perform some low squat pivot stands with heavy assist from therapist and blocking at bilateral knees. He is still unsafe to attempt full sit to stand transfers or ambulation. Pt will benefit from PT services to address deficits in strength, balance, and mobility in order to return to full function at home.    Personal Factors and Comorbidities  Age;Comorbidity 3+    Comorbidities  Relevant past medical history and comorbidities include long term steroid use for lupus, CKD, chronic osteomyelitis, cervical pine stenosis, BPH, APS, alcohol abuse, peripheral artery disease, depression, GERD, obstructive sleep apnea, HTN, IBS, IgA deficiency, lumbar radiculopathy, RA, Stroke, toxic maculopathy in both eyes, systemic lupus erythematosus related syndrome, cardiac catheterization, carotid endarterectomy, cervical laminectomy, coronary angioplasty, Fusion C5-C7, knee arthroplasty, lumbar surgery.     Examination-Activity Limitations  Bed Mobility;Bend;Sit;Toileting;Stand;Stairs;Lift;Transfers;Squat;Locomotion Level;Carry;Dressing;Hygiene/Grooming;Continence    Examination-Participation Restrictions  Yard Work;Interpersonal Relationship;Community Activity    Stability/Clinical Decision Making  Evolving/Moderate complexity    Rehab Potential  Fair    PT Frequency  3x / week    PT Duration  12 weeks    PT Treatment/Interventions  ADLs/Self Care Home Management;Electrical Stimulation;Moist Heat;Cryotherapy;Gait training;Stair training;Functional mobility training;Therapeutic exercise;Balance training;Neuromuscular  re-education;Cognitive remediation;Patient/family education;Orthotic Fit/Training;Wheelchair mobility training;Manual techniques;Passive range of motion;Energy conservation;Joint Manipulations    PT Next Visit Plan  work on functional strength and establish HEP    PT Home Exercise Plan  --    Consulted and Agree with Plan of Care  Patient;Family member/caregiver    Family Member Consulted  daugher [name]       Patient will benefit from skilled therapeutic intervention in order to improve the following deficits and impairments:  Abnormal gait, Decreased activity tolerance, Decreased cognition, Decreased endurance, Decreased knowledge of use of DME, Decreased range of motion, Decreased skin integrity, Decreased strength, Impaired perceived functional ability, Impaired sensation, Impaired UE functional use, Improper body mechanics, Pain, Cardiopulmonary status limiting activity, Decreased balance, Decreased coordination, Decreased mobility, Difficulty walking, Impaired tone, Postural dysfunction  Visit Diagnosis: Muscle weakness (generalized)  Difficulty in walking, not elsewhere classified  Hemiplegia and hemiparesis following cerebral infarction affecting left non-dominant side ([place])     Problem List There are no active problems to display for this patient.  Phillips Grout PT, DPT, GCS  Huprich,Jason 07/10/2018, 3:00 PM  [place] [facility] SERVICES [address] [place], [place], [address] Phone: [phone]   Fax:  [phone]  Name: Kenneth Summers MRN: [id number] Date of Birth: December 13, 1945

## 2018-07-11 ENCOUNTER — Ambulatory Visit: Payer: Medicare Other | Admitting: Occupational Therapy

## 2018-07-11 ENCOUNTER — Encounter: Payer: Self-pay | Admitting: Physical Therapy

## 2018-07-11 ENCOUNTER — Encounter: Payer: Self-pay | Admitting: Occupational Therapy

## 2018-07-11 ENCOUNTER — Other Ambulatory Visit: Payer: Self-pay

## 2018-07-11 ENCOUNTER — Ambulatory Visit: Payer: Medicare Other | Admitting: Physical Therapy

## 2018-07-11 DIAGNOSIS — R278 Other lack of coordination: Secondary | ICD-10-CM

## 2018-07-11 DIAGNOSIS — M6281 Muscle weakness (generalized): Secondary | ICD-10-CM

## 2018-07-11 DIAGNOSIS — R262 Difficulty in walking, not elsewhere classified: Secondary | ICD-10-CM

## 2018-07-11 NOTE — Therapy (Signed)
[place] MAIN [facility] SERVICES [address] [place], [place], [address] Phone: [phone]   Fax:  [phone]  Occupational Therapy Treatment  Patient Details  Name: Kenneth Summers MRN: [id number] Date of Birth: [birth year] No data recorded  Encounter Date: 07/11/2018  OT End of Session - 07/11/18 1855    Visit Number  11    Number of Visits  24    Date for OT Re-Evaluation  09/04/18    Authorization Type  Progress report period starting 06/12/2018    OT Start Time  1425    OT Stop Time  1510    OT Time Calculation (min)  45 min    Activity Tolerance  Patient tolerated treatment well    Behavior During Therapy  [facility] for tasks assessed/performed       Past Medical History:  Diagnosis Date  . Alcohol abuse   . APS (antiphospholipid syndrome) ([place])   . BPH (benign prostatic hyperplasia)   . CAD (coronary artery disease)   . Cellulitis   . Cervical spinal stenosis   . Chronic osteomyelitis ([place])   . CKD (chronic kidney disease)   . CKD (chronic kidney disease)   . Clostridium difficile diarrhea   . Depression   . Diplopia   . Fatigue   . GERD (gastroesophageal reflux disease)   . Hyperlipemia   . Hypertension   . Hypovitaminosis D   . IBS (irritable bowel syndrome)   . 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 Insomnia   . Left lumbosacral radiculopathy   . Moderate obstructive sleep apnea   . Osteomyelitis of foot ([place])   . PAD (peripheral artery disease) ([place])   . Pruritus   . RA (rheumatoid arthritis) ([place])   . Radiculopathy   . Restless legs syndrome   . RLS (restless legs syndrome)   . SLE (systemic lupus erythematosus related syndrome) ([place])   . Stroke ([place])   . Toxic maculopathy of both eyes     Past Surgical History:  Procedure Laterality Date  . CARDIAC CATHETERIZATION    . CAROTID ENDARTERECTOMY    . CERVICAL LAMINECTOMY    . CORONARY ANGIOPLASTY    . FRACTURE SURGERY    . fusion C5-6-7    . HERNIA REPAIR    . KNEE  ARTHROSCOPY    . TONSILLECTOMY      There were no vitals filed for this visit.  Subjective Assessment - 07/11/18 1855    Subjective   Pt.'s daughter was present during the session.    Pertinent History  Pt. is a 73 y.o. 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 Pt. PMHx includes: Multiple Falls, Lupus, DJD, Renal Abscess, CVA. Pt. resides with his wife. Pt.'s wife and daughter assist with ADLs. Pt. has caregivers in  for 2 hours a day, 6 days a week. Pt. received Rehab services in acute care, at SNF for STR, and [facility]. Pt. is retired from [employer] for [employer] and [employer].    Patient Stated Goals  To regain use of his left UE, and do more for himself.    Currently in Pain?  No/denies      OT TREATMENT    Neuro muscular re-education:  Pt. Worked on [facility] skills grasping, flipping, and sorting cards with his Left hand, as well as adding a cognitive memory component to the the task. Pt. Worked on constructing a PCP pipe tower using his bilateral UEs following  a pattern design. Pt. required verbal cues, and increased time to complete, and cues for the proper pipe shape, following the pattern, and to engage the LUE.  Therapeutic Exercise:  Pt. Worked on BUE strengthening with a 2# dowel. Pt. Worked on reaching forward with the UEs while encouraging forward trunk flexion using the dowel to reach specified target. Pt. demonstrated improved, and sustained forward trunk flexion with upright midline sitting.                         OT Education - 07/11/18 1855    Education Details  LE dressing skills    Person(s) Educated  Patient;Child(ren)    Methods  Explanation    Comprehension  Verbalized understanding;Returned demonstration          OT Long Term Goals - 07/10/18 1638      OT LONG TERM GOAL #1   Title  Pt. will increase left shoulder flexion AROM by 10 degrees to access cabinet/shelf.    Baseline  07/10/2018:  AROM left shoulder flexion: 104    Time  12    Period  Weeks    Status  On-going    Target Date  09/04/18      OT LONG TERM GOAL #2   Title  Pt. will donn a shirt with Supervision.    Baseline  07/10/2018: Min-ModA    Time  12    Period  Weeks    Status  New    Target Date  09/04/18      OT LONG TERM GOAL #3   Title  Pt. will require ModA to perform LE dressing    Baseline  Eval: MaxA left, ModA with the right    Time  12    Period  Weeks    Status  On-going    Target Date  09/04/18      OT LONG TERM GOAL #4   Title  Pt. will improve left grip strength by 10# to be able to open a jar/container.    Baseline  07/10/2018: Left: 29, Right 40    Time  12    Period  Weeks    Status  On-going    Target Date  09/04/18      OT LONG TERM GOAL #5   Title  Pt. will improve left hand [facility] skills to be able to assit with buttoning/zipping.    Baseline  Eval: Left: 1 min & 52 sec. R: 56 sec.    Time  12    Period  Weeks    Status  New    Target Date  09/04/18      OT LONG TERM GOAL #6   Title  Pt. will demonstrate visual conmpensatory strategies during 100% of the time during self care.    Baseline  Continue    Time  12    Period  Weeks    Status  On-going    Target Date  09/04/18      OT LONG TERM GOAL #7   Title  Pt. will prepare a simple cold snack from w/c with supervision using cognitive compensatory strategies 100% of the time.    Baseline  Pt. is unable    Time  12    Period  Weeks    Status  On-going    Target Date  09/04/18      OT LONG TERM GOAL #8   Title  Pt. will accurately identify potential safety  hazard using good safety awareness, and judgement 100% for ADLs, and IADLs.    Baseline  Continue    Time  12    Period  Weeks    Status  On-going    Target Date  09/04/18            Plan - 07/11/18 1856    Clinical Impression Statement  Pt. demonstrated improved core trunk strength, and was consistently able to lean forward in the w/c duirng reaching tasks  with the LUE, and demonstrated improved attention to tasks even when challenged with distractions. Pt. continues to present with limited LUE strength, and [facility] skills and continues to work on improving theses skills in order to improve, and initiate engaging the LUE during daily care tasks promoting independence.    Occupational performance deficits (Please refer to evaluation for details):  ADL's    Body Structure / Function / Physical Skills  UE functional use    Cognitive Skills  Attention;Memory;Emotional;Problem Solve;Safety Awareness    Clinical Decision Making  Multiple treatment options, significant modification of task necessary    Comorbidities Affecting Occupational Performance:  Presence of comorbidities impacting occupational performance    Comorbidities impacting occupational performance description:  Phyical, cognitive, visual,  medical comorbidities    Modification or Assistance to Complete Evaluation   Max significant modification of tasks or assist is necessary to complete    OT Frequency  3x / week    OT Duration  12 weeks    OT Treatment/Interventions  Self-care/ADL training;DME and/or AE instruction;Therapeutic exercise;Therapeutic activities;Moist Heat;Cognitive remediation/compensation;Neuromuscular education;Visual/perceptual remediation/compensation;Coping strategies training;Patient/family education;Passive range of motion;Psychosocial skills training;Energy conservation;Functional Mobility Training    Consulted and Agree with Plan of Care  Patient;Family member/caregiver    Family Member Consulted  Daughter [name]       Patient will benefit from skilled therapeutic intervention in order to improve the following deficits and impairments:  Cognitive Skills, Body Structure / Function / Physical Skills  Visit Diagnosis: Muscle weakness (generalized)  Other lack of coordination    Problem List There are no active problems to display for this patient.   Harrel Carina, MS, OTR/L 07/11/2018, 7:05 PM  [place] MAIN [facility] SERVICES [address] [place], [place], [address] Phone: [phone]   Fax:  [phone]  Name: BARNEY RUSSOMANNO MRN: [id number] Date of Birth: [birth year]

## 2018-07-11 NOTE — Therapy (Signed)
[place] MAIN [facility] SERVICES [address] [place], [place], [address] Phone: [phone]   Fax:  [phone]  Physical Therapy Treatment  Patient Details  Name: Kenneth Summers MRN: [id number] Date of Birth: May 05, 1945 Referring Provider (PT): Dr. [name], Guy Begin   Encounter Date: 07/11/2018  PT End of Session - 07/11/18 1324    Visit Number  12    Number of Visits  24    Date for PT Re-Evaluation  09/11/18    Authorization Type  Medicare reporting period from 06/12/2018    Authorization Time Period  Current cert period 6/96/2952 - 09/11/2018    PT Start Time  1330    PT Stop Time  1415    PT Time Calculation (min)  45 min    Equipment Utilized During Treatment  Gait belt    Activity Tolerance  Patient tolerated treatment well;Patient limited by fatigue    Behavior During Therapy  [facility] for tasks assessed/performed       Past Medical History:  Diagnosis Date  . Alcohol abuse   . APS (antiphospholipid syndrome) ([place])   . BPH (benign prostatic hyperplasia)   . CAD (coronary artery disease)   . Cellulitis   . Cervical spinal stenosis   . Chronic osteomyelitis ([place])   . CKD (chronic kidney disease)   . CKD (chronic kidney disease)   . Clostridium difficile diarrhea   . Depression   . Diplopia   . Fatigue   . GERD (gastroesophageal reflux disease)   . Hyperlipemia   . Hypertension   . Hypovitaminosis D   . IBS (irritable bowel syndrome)   . IgA deficiency ([place])   . Insomnia   . Left lumbosacral radiculopathy   . Moderate obstructive sleep apnea   . Osteomyelitis of foot ([place])   . PAD (peripheral artery disease) ([place])   . Pruritus   . RA (rheumatoid arthritis) ([place])   . Radiculopathy   . Restless legs syndrome   . RLS (restless legs syndrome)   . SLE (systemic lupus erythematosus related syndrome) ([place])   . Stroke ([place])   . Toxic maculopathy of both eyes     Past Surgical History:  Procedure Laterality Date  .  CARDIAC CATHETERIZATION    . CAROTID ENDARTERECTOMY    . CERVICAL LAMINECTOMY    . CORONARY ANGIOPLASTY    . FRACTURE SURGERY    . fusion C5-6-7    . HERNIA REPAIR    . KNEE ARTHROSCOPY    . TONSILLECTOMY      There were no vitals filed for this visit.  Subjective Assessment - 07/11/18 1334    Subjective  Patient reports feeling good today; reports some scabbing on sacral wound;     Patient is accompained by:  Family member   intermittantly; she thought she could not come in   Pertinent History  Patient is a 73 y.o. male who presents to outpatient physical therapy with a referral for medical diagnosis of CVA. This patient's chief complaints consist of left hemiplegia and overall deconditioning leading to the following functional deficits: dependent for ADLs, IADLs, unable to transfer to car, dependent transfers at home with hoyer lift, unable to walk or stand without significant assistance, difficulty with W/C navigation..    Limitations  Sitting;Lifting;Standing;House hold activities;Writing;Walking;Other (comment)   stairs, transferring   Diagnostic tests  Brain MRI 11/01/2017: IMPRESSION: 1. Acute right ACA territory infarct as demonstrated on prior CT imaging. No intracranial hemorrhage or significant mass effect.  2. 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    Patient Stated Goals  wants to be able to walk again    Currently in Pain?  No/denies    Multiple Pain Sites  No           TREATMENT: Prior to exercise, PT instructed patient in transfers and bed mobility;   Therapeutic Activity: Patient transferred wheelchair to mat table, using sliding board, progressed to mod A +2 for set up and transfer. Patient able to reach with RUE to assist with transfer, instructed patient to position trunk and hips in proper position prior to transfer for optimum mobility. Patient does require increased time but was able to participate more as compared to  previous sessions.    Educated patient in bed mobility:  Pt transitioned sit to right sidelying with mod A +2 for lifting LE into bed and controlling trunk position to sidelying and rolling to supine. Patient requires mod VCs for correct positioning and technique, specifically for hand placement and trunk position to reduce back discomfort. Rolled sidelying to hooklying with cues to keep knees bent for better positioning to reduce back discomfort;     Instructed patient in bed mobility with rolling, supine<>sitting x3 reps with mod VCs for hand placement, LE positioning, trunk control and correct technique. Patient able to exhibit better motor control and muscle activation with increased repetition progressing from mod A +2 to min A +2. Patient does require cues and assistance for crossing right foot under left foot to assist with LE movement.    At end of session, transferred patient back mat table to wheelchair with sliding board, leading with strong side (R side). Mod A +2 for positioning and during transfer requiring therapist assist to block LE for better positioning; Patient able to reach with RUE to pull to wheelchair but does have significant weakness in trunk limiting slide board transfer; He requires increased time due to weakness and difficulty sliding on board.    Exercise: Hooklying: -lumbar trunk rotation x5 reps bilaterally to facilitate better lumbopelvic mobility; -Bridges with arms by side, x10 reps with cues to posterior pelvic rock for better pelvic activation for better strengthening;  Seated: -Anterior/posterior pelvic rocks x5reps; -forward/backward weight shift with erect posture x5 reps;  Requires close supervision to min A  for safety; able to exhibit good balance control without loss of balance;    Response to treatment: Patient able to exhibit better postural control and muscle activation during today's session. He followed verbal cues well with proper positioning and  muscle control. Patient does fatigue quickly.  Patient denies any increase in pain with advanced exercise.                       PT Education - 07/11/18 1324    Education Details  positioning, strengthening, bed mobility; HEP reinforced;     Person(s) Educated  Patient;Child(ren)    Methods  Explanation;Verbal cues    Comprehension  Verbalized understanding;Returned demonstration;Verbal cues required;Need further instruction       PT Short Term Goals - 07/08/18 1405      PT SHORT TERM GOAL #1   Title  Be independent with initial home exercise program for self-management of symptoms.    Baseline  HEP to be given at second session; advice to practice unsupported sitting at home (06/19/2018); 07/08/18: Performing at home    Time  2    Period  Weeks    Status  On-going    Target Date  06/26/18        PT Long Term Goals - 07/08/18 1405      PT LONG TERM GOAL #1   Title  Patient will complete all bed mobility with min A to improve functional independence for getting in and out of bed and adjusting in bed.     Baseline  max A - total A reported by family (06/19/2018); 07/08/18: still requires maxA, dtr reports improvement in rolling since starting therapy    Time  12    Period  Weeks    Status  On-going    Target Date  09/11/18      PT LONG TERM GOAL #2   Title  Patient will complete sit <> stand transfer chair to chair with Min A and LRAD to improve functional independence for household and community mobility.     Baseline  max A +2 STS at W/C (06/12/2018); max A +2-3 STS or stand pivot transfer, able to complete more reps (06/19/2018); 07/08/18: not safe to attempt at this time    Time  12    Period  Weeks    Status  On-going    Target Date  09/11/18      PT LONG TERM GOAL #3   Title  Patient will navigate manual w/c with min A x 100 feet to improve mobility for household and short community distances.     Baseline  required total A (06/12/2018); able to roll 20 feet with  min A (06/20/2018); 07/08/18: Pt can go approximately 10' without assist per family;    Time  12    Period  Weeks    Status  On-going    Target Date  09/11/18      PT LONG TERM GOAL #4   Title  Patient will complete car transfers W/C to family SUV with min A using LRAD to improve his abilty to participate in community activities.     Baseline  unable to complete car transfers at all (06/12/2018); unable to complete car transfers at all (06/19/2018).; 07/08/18: Unable to perform at this time    Time  12    Period  Weeks    Status  On-going    Target Date  09/11/18      PT LONG TERM GOAL #5   Title  Patient will ambulate at least 150 feet with mod I using LRAD to improve mobility for household and community distances.     Baseline  unable to take steps (06/12/2018); able to shift R leg forward and back with max A and RW at edge of plinth (06/19/2018); 07/08/18: unable to ambulate at this time.     Time  12    Period  Weeks    Status  On-going    Target Date  09/11/18      PT LONG TERM GOAL #6   Title  Patient will navigate 4 steps with min A and BUE support to improve his ability to access community and social participation.     Baseline  unable to complet stairs (06/12/2018); unable to complete stairs (06/19/2018); 07/08/18: unable to attempt at this time    Time  12    Period  Weeks    Status  On-going    Target Date  09/11/18            Plan - 07/11/18 1531    Clinical Impression Statement  Patient is motivated and making progress with therapy; He  was instructed in advanced bed mobility; patient continues to require increased time to complete tasks due to fatigue and incoordination. He often requires cues for better trunk control and balance upon sitting edge of bed. Patient was able to exhibit better hip ROM and muscle activation for better strengthening with exercise. Patient was heavily fatigue at end of session. Patient would benefit from additional skilled PT Intervention to improve  strength and mobility;     Personal Factors and Comorbidities  Age;Comorbidity 3+    Comorbidities  Relevant past medical history and comorbidities include long term steroid use for lupus, CKD, chronic osteomyelitis, cervical pine stenosis, BPH, APS, alcohol abuse, peripheral artery disease, depression, GERD, obstructive sleep apnea, HTN, IBS, IgA deficiency, lumbar radiculopathy, RA, Stroke, toxic maculopathy in both eyes, systemic lupus erythematosus related syndrome, cardiac catheterization, carotid endarterectomy, cervical laminectomy, coronary angioplasty, Fusion C5-C7, knee arthroplasty, lumbar surgery.     Examination-Activity Limitations  Bed Mobility;Bend;Sit;Toileting;Stand;Stairs;Lift;Transfers;Squat;Locomotion Level;Carry;Dressing;Hygiene/Grooming;Continence    Examination-Participation Restrictions  Yard Work;Interpersonal Relationship;Community Activity    Stability/Clinical Decision Making  Evolving/Moderate complexity    Rehab Potential  Fair    PT Frequency  3x / week    PT Duration  12 weeks    PT Treatment/Interventions  ADLs/Self Care Home Management;Electrical Stimulation;Moist Heat;Cryotherapy;Gait training;Stair training;Functional mobility training;Therapeutic exercise;Balance training;Neuromuscular re-education;Cognitive remediation;Patient/family education;Orthotic Fit/Training;Wheelchair mobility training;Manual techniques;Passive range of motion;Energy conservation;Joint Manipulations    PT Next Visit Plan  work on functional strength and establish HEP    Consulted and Agree with Plan of Care  Patient;Family member/caregiver    Family Member Consulted  daugher [name]       Patient will benefit from skilled therapeutic intervention in order to improve the following deficits and impairments:  Abnormal gait, Decreased activity tolerance, Decreased cognition, Decreased endurance, Decreased knowledge of use of DME, Decreased range of motion, Decreased skin integrity, Decreased  strength, Impaired perceived functional ability, Impaired sensation, Impaired UE functional use, Improper body mechanics, Pain, Cardiopulmonary status limiting activity, Decreased balance, Decreased coordination, Decreased mobility, Difficulty walking, Impaired tone, Postural dysfunction  Visit Diagnosis: Muscle weakness (generalized)  Other lack of coordination  Difficulty in walking, not elsewhere classified     Problem List There are no active problems to display for this patient.   Trotter,Margaret PT, DPT 07/11/2018, 3:45 PM  [place] MAIN [facility] SERVICES [address] [place], [place], [address] Phone: [phone]   Fax:  [phone]  Name: Kenneth Summers MRN: [id number] Date of Birth: 1945-10-05

## 2018-07-15 ENCOUNTER — Encounter: Payer: Self-pay | Admitting: Occupational Therapy

## 2018-07-15 ENCOUNTER — Other Ambulatory Visit: Payer: Self-pay

## 2018-07-15 ENCOUNTER — Ambulatory Visit: Payer: Medicare Other | Admitting: Occupational Therapy

## 2018-07-15 ENCOUNTER — Ambulatory Visit: Payer: Medicare Other

## 2018-07-15 DIAGNOSIS — M6281 Muscle weakness (generalized): Secondary | ICD-10-CM | POA: Diagnosis not present

## 2018-07-15 DIAGNOSIS — R278 Other lack of coordination: Secondary | ICD-10-CM

## 2018-07-15 DIAGNOSIS — R262 Difficulty in walking, not elsewhere classified: Secondary | ICD-10-CM

## 2018-07-15 NOTE — Therapy (Signed)
[place] MAIN [facility] SERVICES [address] [place], [place], [address] Phone: [phone]   Fax:  [phone]  Occupational Therapy Treatment  Patient Details  Name: Kenneth Summers MRN: [id number] Date of Birth: 05/20/1945 No data recorded  Encounter Date: 07/15/2018  OT End of Session - 07/15/18 1520    Visit Number  12    Number of Visits  24    Date for OT Re-Evaluation  09/04/18    Authorization Type  Progress report period starting 06/12/2018    OT Start Time  1420    OT Stop Time  1505    OT Time Calculation (min)  45 min    Activity Tolerance  Patient tolerated treatment well    Behavior During Therapy  [facility] for tasks assessed/performed       Past Medical History:  Diagnosis Date  . Alcohol abuse   . APS (antiphospholipid syndrome) ([place])   . BPH (benign prostatic hyperplasia)   . CAD (coronary artery disease)   . Cellulitis   . Cervical spinal stenosis   . Chronic osteomyelitis ([place])   . CKD (chronic kidney disease)   . CKD (chronic kidney disease)   . Clostridium difficile diarrhea   . Depression   . Diplopia   . Fatigue   . GERD (gastroesophageal reflux disease)   . Hyperlipemia   . Hypertension   . Hypovitaminosis D   . IBS (irritable bowel syndrome)   . 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 Insomnia   . Left lumbosacral radiculopathy   . Moderate obstructive sleep apnea   . Osteomyelitis of foot ([place])   . PAD (peripheral artery disease) ([place])   . Pruritus   . RA (rheumatoid arthritis) ([place])   . Radiculopathy   . Restless legs syndrome   . RLS (restless legs syndrome)   . SLE (systemic lupus erythematosus related syndrome) ([place])   . Stroke ([place])   . Toxic maculopathy of both eyes     Past Surgical History:  Procedure Laterality Date  . CARDIAC CATHETERIZATION    . CAROTID ENDARTERECTOMY    . CERVICAL LAMINECTOMY    . CORONARY ANGIOPLASTY    . FRACTURE SURGERY    . fusion C5-6-7    . HERNIA REPAIR    . KNEE  ARTHROSCOPY    . TONSILLECTOMY      There were no vitals filed for this visit.  Subjective Assessment - 07/15/18 1520    Subjective   Pt.'s daughter was prsent during the session.    Pertinent History  Pt. is a 73 y.o. 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 Pt. PMHx includes: Multiple Falls, Lupus, DJD, Renal Abscess, CVA. Pt. resides with his wife. Pt.'s wife and daughter assist with ADLs. Pt. has caregivers in  for 2 hours a day, 6 days a week. Pt. received Rehab services in acute care, at SNF for STR, and [facility] services. Pt. is retired from [employer] for [employer] and [employer].    Patient Stated Goals  To regain use of his left UE, and do more for himself.    Currently in Pain?  No/denies      OT TREATMENT    Neuro muscular re-education:  Pt. worked on grasping, and manipulating 1/2" washers from a magnetic dish using left hand 2pt. point grasp pattern. Pt. worked on reaching up, stabilizing, and sustaining shoulder elevation while placing the washer over a small precise target on  vertical dowels positioned at various angles, as well as dowels positioned diagonally to the left.  Selfcare:  Pt. worked on [employer] his w/c throughout the kitchen in attempts to access cabinetry, and appliances. Pt. required assist to position the w/c close enough to the cabinets, and appliances to be able to reach in. Pt. utilized a reacher independently to retrieve light objects from the first shelf. Pt. education was provided about work simplification techniques in the kitchen.                    OT Education - 07/15/18 1520    Education Details  LE dressing skills    Person(s) Educated  Patient;Child(ren)    Methods  Explanation    Comprehension  Verbalized understanding;Returned demonstration          OT Long Term Goals - 07/10/18 1638      OT LONG TERM GOAL #1   Title  Pt. will increase left shoulder flexion AROM by 10  degrees to access cabinet/shelf.    Baseline  07/10/2018: AROM left shoulder flexion: 104    Time  12    Period  Weeks    Status  On-going    Target Date  09/04/18      OT LONG TERM GOAL #2   Title  Pt. will donn a shirt with Supervision.    Baseline  07/10/2018: Min-ModA    Time  12    Period  Weeks    Status  New    Target Date  09/04/18      OT LONG TERM GOAL #3   Title  Pt. will require ModA to perform LE dressing    Baseline  Eval: MaxA left, ModA with the right    Time  12    Period  Weeks    Status  On-going    Target Date  09/04/18      OT LONG TERM GOAL #4   Title  Pt. will improve left grip strength by 10# to be able to open a jar/container.    Baseline  07/10/2018: Left: 29, Right 40    Time  12    Period  Weeks    Status  On-going    Target Date  09/04/18      OT LONG TERM GOAL #5   Title  Pt. will improve left hand [facility] skills to be able to assit with buttoning/zipping.    Baseline  Eval: Left: 1 min & 52 sec. R: 56 sec.    Time  12    Period  Weeks    Status  New    Target Date  09/04/18      OT LONG TERM GOAL #6   Title  Pt. will demonstrate visual conmpensatory strategies during 100% of the time during self care.    Baseline  Continue    Time  12    Period  Weeks    Status  On-going    Target Date  09/04/18      OT LONG TERM GOAL #7   Title  Pt. will prepare a simple cold snack from w/c with supervision using cognitive compensatory strategies 100% of the time.    Baseline  Pt. is unable    Time  12    Period  Weeks    Status  On-going    Target Date  09/04/18      OT LONG TERM GOAL #8   Title  Pt. will accurately identify potential  safety hazard using good safety awareness, and judgement 100% for ADLs, and IADLs.    Baseline  Continue    Time  12    Period  Weeks    Status  On-going    Target Date  09/04/18            Plan - 07/15/18 1521    Clinical Impression Statement  Pt. is making progress consistently, and has improved with LUE  strength, grip strength, pinch strength, motor control, coordination skills. Pt. continues to work on improving LUE functioning, and engaging the LUE during daily ADL, and IADL tasks. Pt. continues to work on improving left sided awareness, attention to task, and LUE functioning during ADLs, and IADL tasks.       Occupational performance deficits (Please refer to evaluation for details):  ADL's    Body Structure / Function / Physical Skills  UE functional use    Cognitive Skills  Attention;Memory;Emotional;Problem Solve;Safety Awareness    Clinical Decision Making  Multiple treatment options, significant modification of task necessary    Comorbidities Affecting Occupational Performance:  Presence of comorbidities impacting occupational performance    Comorbidities impacting occupational performance description:  Phyical, cognitive, visual,  medical comorbidities    Modification or Assistance to Complete Evaluation   Max significant modification of tasks or assist is necessary to complete    OT Frequency  3x / week    OT Duration  12 weeks    OT Treatment/Interventions  Self-care/ADL training;DME and/or AE instruction;Therapeutic exercise;Therapeutic activities;Moist Heat;Cognitive remediation/compensation;Neuromuscular education;Visual/perceptual remediation/compensation;Coping strategies training;Patient/family education;Passive range of motion;Psychosocial skills training;Energy conservation;Functional Mobility Training    Recommended Other Services  PT    Consulted and Agree with Plan of Care  Patient;Family member/caregiver    Family Member Consulted  Daughter [name]       Patient will benefit from skilled therapeutic intervention in order to improve the following deficits and impairments:  Cognitive Skills, Body Structure / Function / Physical Skills  Visit Diagnosis: Muscle weakness (generalized)  Other lack of coordination    Problem List There are no active problems to display for  this patient.   Harrel Carina, MS, OTR/L 07/15/2018, 3:34 PM  [place] MAIN [facility] SERVICES [address] [place], [place], [address] Phone: [phone]   Fax:  [phone]  Name: Kenneth Summers MRN: [id number] Date of Birth: [birth year]

## 2018-07-15 NOTE — Therapy (Signed)
[place] MAIN [facility] SERVICES [address] [place], [place], [address] Phone: [phone]   Fax:  [phone]  Physical Therapy Treatment  Patient Details  Name: Kenneth Summers MRN: [id number] Date of Birth: 1945/10/10 Referring Provider (PT): Dr. [name], Guy Begin   Encounter Date: 07/15/2018  PT End of Session - 07/15/18 1325    Visit Number  13    Number of Visits  24    Date for PT Re-Evaluation  09/11/18    Authorization Type  Medicare reporting period from 06/12/2018    Authorization Time Period  Current cert period 6/96/2952 - 09/11/2018    PT Start Time  1325    PT Stop Time  1410    PT Time Calculation (min)  45 min    Equipment Utilized During Treatment  Gait belt    Activity Tolerance  Patient tolerated treatment well;Patient limited by fatigue    Behavior During Therapy  [facility] for tasks assessed/performed       Past Medical History:  Diagnosis Date  . Alcohol abuse   . APS (antiphospholipid syndrome) ([place])   . BPH (benign prostatic hyperplasia)   . CAD (coronary artery disease)   . Cellulitis   . Cervical spinal stenosis   . Chronic osteomyelitis ([place])   . CKD (chronic kidney disease)   . CKD (chronic kidney disease)   . Clostridium difficile diarrhea   . Depression   . Diplopia   . Fatigue   . GERD (gastroesophageal reflux disease)   . Hyperlipemia   . Hypertension   . Hypovitaminosis D   . IBS (irritable bowel syndrome)   . IgA deficiency ([place])   . Insomnia   . Left lumbosacral radiculopathy   . Moderate obstructive sleep apnea   . Osteomyelitis of foot ([place])   . PAD (peripheral artery disease) ([place])   . Pruritus   . RA (rheumatoid arthritis) ([place])   . Radiculopathy   . Restless legs syndrome   . RLS (restless legs syndrome)   . SLE (systemic lupus erythematosus related syndrome) ([place])   . Stroke ([place])   . Toxic maculopathy of both eyes     Past Surgical History:  Procedure Laterality Date  .  CARDIAC CATHETERIZATION    . CAROTID ENDARTERECTOMY    . CERVICAL LAMINECTOMY    . CORONARY ANGIOPLASTY    . FRACTURE SURGERY    . fusion C5-6-7    . HERNIA REPAIR    . KNEE ARTHROSCOPY    . TONSILLECTOMY      There were no vitals filed for this visit.  Subjective Assessment - 07/15/18 1325    Subjective  Patient reports feeling good today. Denies pain. No changes per patient and daughter. No specific questions or concerns at this time.     Patient is accompained by:  Family member   intermittantly; she thought she could not come in   Pertinent History  Patient is a 73 y.o. male who presents to outpatient physical therapy with a referral for medical diagnosis of CVA. This patient's chief complaints consist of left hemiplegia and overall deconditioning leading to the following functional deficits: dependent for ADLs, IADLs, unable to transfer to car, dependent transfers at home with hoyer lift, unable to walk or stand without significant assistance, difficulty with W/C navigation..    Limitations  Sitting;Lifting;Standing;House hold activities;Writing;Walking;Other (comment)   stairs, transferring   Diagnostic tests  Brain MRI 11/01/2017: IMPRESSION: 1. Acute right ACA territory infarct as demonstrated on  prior CT imaging. No intracranial hemorrhage or significant mass effect. 2. 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    Patient Stated Goals  wants to be able to walk again    Currently in Pain?  No/denies           TREATMENT   Therapeutic Activity Patient transferred wheelchair to mat table toward his strong, R side, using sliding board,progressed to modA +2 for set up and transfer. Patient able to reach with RUE to assist with transfer, instructed patient to position trunk and hips in proper position prior to transfer for optimum mobility.   Following supine exercise, practiced rolling to the R x 5 with cues for L scapular protraction, L  heel slide followed by adduction, and finally trunk rotation. Afterwards rolled to the L side in order to sit edge of mat. He ismin A +1with cues to reach withRUE to facilitate rolling to the left.Patient then required modA +1 for transition from L sidelying to sitting with assistance to slide legs off mat table and to push throughBUE to raise trunk to sitting;  At end of session, transferred patient mat table to wheelchair, leading with strong side (R side)using sliding board.ModA +2 for positioning and during transfer requiring therapist assist to block LE for better positioning; Patient able to reach with RUE to pull to wheelchair but does have significant weakness in trunk limiting slide board transfer. He requires increased time/effort due to weakness.   Estim Attended Hooklying with bolster under legs: PT applied NMES to L quad muscle, large muscle group atrophy setting, 10 sec on, 10 sec off x 10 min at tolerated intensity (#[id number]) with mod VCs to increase quad muscle activation during NMES activation; Patient able to initiate quad activation but does require AAROM to achieve full SAQ ROM despite NMES assistance. He denies any pain or discomfort. He doesdemonstrate increased fatigue as well as increased distraction today and requires frequent redirection.   Ther-ex Hooklying: LLE hip abduction/adduction AAROM with assist to keep LLE off mat table 2 x 5; LLE heel slides with manually resisted extension 2 x 5; Hooklying clams with manual resistance 2 x 5, with AROM adduction against gravity; Bridges with knees on bolster 5s hold, 2 x 5; Hooklying resisted hooklying lumbar rotation with therapist providing challenge at the knees 3s hold x 5 each direction; Supine manually resisted shoulder protraction 3s hold, extensive cues for activation, x 5 each direction; Sitting on mat tablewithout UE support working on upright posture and R/L weight shifting. Tactile cues and practice  for anterior pelvic tilting in sitting x 5. Therapist provided gentle challenge in all directions with cues for pt to resist movement for trunk stability training; Sit to stand transfers with extensive cues for anterior weight shifting while maintaining anterior pelvic tilt. Therapist on one side blocking L knee and rehab tech on other side blocking other knee. Performed x 2 with patient demonstrating improving standing ability compared to previous session. Cues in standing for scapular and cervical retraction as well as increased lumbar extension by pushing hips forward. Once standing pt only requires minA+2 to remain upright;  Response to treatment: Patient able to exhibit better postural control and muscle activation during today's session. He followed verbal cues well with proper positioning and muscle control.Patient denies any increase in pain with advanced exercise. Inspected skin following NMES with good skin integrity without any redness or skin breakdown.   Pt is making good progress with therapy. He is initially  more fatigued and distracted today during supine exercises however it improves during session. He struggles more today with sitting posture and repeatedly falls backwards and to the right. However he is able to come fully upright to standing today with assist from therapist and rehab tech. He is still unsafe to attempt any form of ambulation. Pt smiles frequently in standing and appears encouraged.Pt will benefit from PT services to address deficits in strength, balance, and mobility in order to return to full function at home.                       PT Short Term Goals - 07/08/18 1405      PT SHORT TERM GOAL #1   Title  Be independent with initial home exercise program for self-management of symptoms.    Baseline  HEP to be given at second session; advice to practice unsupported sitting at home (06/19/2018); 07/08/18: Performing at home    Time  2     Period  Weeks    Status  On-going    Target Date  06/26/18        PT Long Term Goals - 07/08/18 1405      PT LONG TERM GOAL #1   Title  Patient will complete all bed mobility with min A to improve functional independence for getting in and out of bed and adjusting in bed.     Baseline  max A - total A reported by family (06/19/2018); 07/08/18: still requires maxA, dtr reports improvement in rolling since starting therapy    Time  12    Period  Weeks    Status  On-going    Target Date  09/11/18      PT LONG TERM GOAL #2   Title  Patient will complete sit <> stand transfer chair to chair with Min A and LRAD to improve functional independence for household and community mobility.     Baseline  max A +2 STS at W/C (06/12/2018); max A +2-3 STS or stand pivot transfer, able to complete more reps (06/19/2018); 07/08/18: not safe to attempt at this time    Time  12    Period  Weeks    Status  On-going    Target Date  09/11/18      PT LONG TERM GOAL #3   Title  Patient will navigate manual w/c with min A x 100 feet to improve mobility for household and short community distances.     Baseline  required total A (06/12/2018); able to roll 20 feet with min A (06/20/2018); 07/08/18: Pt can go approximately 10' without assist per family;    Time  12    Period  Weeks    Status  On-going    Target Date  09/11/18      PT LONG TERM GOAL #4   Title  Patient will complete car transfers W/C to family SUV with min A using LRAD to improve his abilty to participate in community activities.     Baseline  unable to complete car transfers at all (06/12/2018); unable to complete car transfers at all (06/19/2018).; 07/08/18: Unable to perform at this time    Time  12    Period  Weeks    Status  On-going    Target Date  09/11/18      PT LONG TERM GOAL #5   Title  Patient will ambulate at least 150 feet with mod I using LRAD to improve mobility  for household and community distances.     Baseline  unable to take steps  (06/12/2018); able to shift R leg forward and back with max A and RW at edge of plinth (06/19/2018); 07/08/18: unable to ambulate at this time.     Time  12    Period  Weeks    Status  On-going    Target Date  09/11/18      PT LONG TERM GOAL #6   Title  Patient will navigate 4 steps with min A and BUE support to improve his ability to access community and social participation.     Baseline  unable to complet stairs (06/12/2018); unable to complete stairs (06/19/2018); 07/08/18: unable to attempt at this time    Time  12    Period  Weeks    Status  On-going    Target Date  09/11/18            Plan - 07/15/18 1326    Clinical Impression Statement  Pt is making good progress with therapy. He is initially more fatigued and distracted today during supine exercises however it improves during session. He struggles more today with sitting posture and repeatedly falls backwards and to the right. However he is able to come fully upright to standing today with assist from therapist and rehab tech. He is still unsafe to attempt any form of ambulation. Pt smiles frequently in standing and appears encouraged.Pt will benefit from PT services to address deficits in strength, balance, and mobility in order to return to full function at home.    Personal Factors and Comorbidities  Age;Comorbidity 3+    Comorbidities  Relevant past medical history and comorbidities include long term steroid use for lupus, CKD, chronic osteomyelitis, cervical pine stenosis, BPH, APS, alcohol abuse, peripheral artery disease, depression, GERD, obstructive sleep apnea, HTN, IBS, IgA deficiency, lumbar radiculopathy, RA, Stroke, toxic maculopathy in both eyes, systemic lupus erythematosus related syndrome, cardiac catheterization, carotid endarterectomy, cervical laminectomy, coronary angioplasty, Fusion C5-C7, knee arthroplasty, lumbar surgery.     Examination-Activity Limitations  Bed  Mobility;Bend;Sit;Toileting;Stand;Stairs;Lift;Transfers;Squat;Locomotion Level;Carry;Dressing;Hygiene/Grooming;Continence    Examination-Participation Restrictions  Yard Work;Interpersonal Relationship;Community Activity    Stability/Clinical Decision Making  Evolving/Moderate complexity    Rehab Potential  Fair    PT Frequency  3x / week    PT Duration  12 weeks    PT Treatment/Interventions  ADLs/Self Care Home Management;Electrical Stimulation;Moist Heat;Cryotherapy;Gait training;Stair training;Functional mobility training;Therapeutic exercise;Balance training;Neuromuscular re-education;Cognitive remediation;Patient/family education;Orthotic Fit/Training;Wheelchair mobility training;Manual techniques;Passive range of motion;Energy conservation;Joint Manipulations    PT Next Visit Plan  work on functional strength and establish HEP    Consulted and Agree with Plan of Care  Patient;Family member/caregiver    Family Member Consulted  daugher [name]       Patient will benefit from skilled therapeutic intervention in order to improve the following deficits and impairments:  Abnormal gait, Decreased activity tolerance, Decreased cognition, Decreased endurance, Decreased knowledge of use of DME, Decreased range of motion, Decreased skin integrity, Decreased strength, Impaired perceived functional ability, Impaired sensation, Impaired UE functional use, Improper body mechanics, Pain, Cardiopulmonary status limiting activity, Decreased balance, Decreased coordination, Decreased mobility, Difficulty walking, Impaired tone, Postural dysfunction  Visit Diagnosis: Muscle weakness (generalized)  Difficulty in walking, not elsewhere classified     Problem List There are no active problems to display for this patient.  Lyndel Safe Huprich PT, DPT, GCS  Huprich,Jason 07/15/2018, 2:27 PM  [place] MAIN [facility] SERVICES [place]  [place], [place], [address] Phone:  [phone]   Fax:  [phone]  Name: Kenneth Summers MRN: [id number] Date of Birth: [birth year]

## 2018-07-16 ENCOUNTER — Ambulatory Visit: Payer: Medicare Other | Admitting: Physical Therapy

## 2018-07-16 ENCOUNTER — Encounter: Payer: Self-pay | Admitting: Occupational Therapy

## 2018-07-16 ENCOUNTER — Ambulatory Visit: Payer: Medicare Other | Admitting: Occupational Therapy

## 2018-07-16 ENCOUNTER — Other Ambulatory Visit: Payer: Self-pay

## 2018-07-16 ENCOUNTER — Encounter: Payer: Self-pay | Admitting: Physical Therapy

## 2018-07-16 DIAGNOSIS — R262 Difficulty in walking, not elsewhere classified: Secondary | ICD-10-CM

## 2018-07-16 DIAGNOSIS — M6281 Muscle weakness (generalized): Secondary | ICD-10-CM

## 2018-07-16 DIAGNOSIS — R278 Other lack of coordination: Secondary | ICD-10-CM

## 2018-07-16 NOTE — Therapy (Signed)
[place] MAIN [facility] SERVICES [address] [place], [place], [address] Phone: [phone]   Fax:  [phone]  Physical Therapy Treatment  Patient Details  Name: Kenneth Summers MRN: [id number] Date of Birth: 02-Jan-1946 Referring Provider (PT): Dr. [name], Guy Begin   Encounter Date: 07/16/2018  PT End of Session - 07/16/18 1333    Visit Number  14    Number of Visits  24    Date for PT Re-Evaluation  09/11/18    Authorization Type  Medicare reporting period from 06/12/2018    Authorization Time Period  Current cert period 4/48/1856 - 09/11/2018    PT Start Time  1331    PT Stop Time  1415    PT Time Calculation (min)  44 min    Equipment Utilized During Treatment  Gait belt    Activity Tolerance  Patient tolerated treatment well;Patient limited by fatigue    Behavior During Therapy  [facility] for tasks assessed/performed       Past Medical History:  Diagnosis Date  . Alcohol abuse   . APS (antiphospholipid syndrome) ([place])   . BPH (benign prostatic hyperplasia)   . CAD (coronary artery disease)   . Cellulitis   . Cervical spinal stenosis   . Chronic osteomyelitis ([place])   . CKD (chronic kidney disease)   . CKD (chronic kidney disease)   . Clostridium difficile diarrhea   . Depression   . Diplopia   . Fatigue   . GERD (gastroesophageal reflux disease)   . Hyperlipemia   . Hypertension   . Hypovitaminosis D   . IBS (irritable bowel syndrome)   . 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 Insomnia   . Left lumbosacral radiculopathy   . Moderate obstructive sleep apnea   . Osteomyelitis of foot ([place])   . PAD (peripheral artery disease) ([place])   . Pruritus   . RA (rheumatoid arthritis) ([place])   . Radiculopathy   . Restless legs syndrome   . RLS (restless legs syndrome)   . SLE (systemic lupus erythematosus related syndrome) ([place])   . Stroke ([place])   . Toxic maculopathy of both eyes     Past Surgical History:  Procedure Laterality Date  .  CARDIAC CATHETERIZATION    . CAROTID ENDARTERECTOMY    . CERVICAL LAMINECTOMY    . CORONARY ANGIOPLASTY    . FRACTURE SURGERY    . fusion C5-6-7    . HERNIA REPAIR    . KNEE ARTHROSCOPY    . TONSILLECTOMY      There were no vitals filed for this visit.  Subjective Assessment - 07/16/18 1333    Subjective  Patient reports feeling well. He reports feeling more awake. Denies any soreness;     Patient is accompained by:  Family member   intermittantly; she thought she could not come in   Pertinent History  Patient is a 73 y.o. male who presents to outpatient physical therapy with a referral for medical diagnosis of CVA. This patient's chief complaints consist of left hemiplegia and overall deconditioning leading to the following functional deficits: dependent for ADLs, IADLs, unable to transfer to car, dependent transfers at home with hoyer lift, unable to walk or stand without significant assistance, difficulty with W/C navigation..    Limitations  Sitting;Lifting;Standing;House hold activities;Writing;Walking;Other (comment)   stairs, transferring   Diagnostic tests  Brain MRI 11/01/2017: IMPRESSION: 1. Acute right ACA territory infarct as demonstrated on prior CT imaging. No intracranial hemorrhage or significant mass  effect. 2. 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    Patient Stated Goals  wants to be able to walk again    Currently in Pain?  No/denies    Multiple Pain Sites  No             TREATMENT: Therapeutic activity: Patient transferred squad pivot wheelchair to mat table, mod A +2 with mod VCs for trunk control, hand placement and positioning; Provided patient with increased time to scoot forward to edge of chair and to shift hips to better position prior to transfer. He required mod A for positioning feet for better position;   Patient instructed in sit<>Stand transfer with RW; utilized rolling walker to help make transfer more  functional in hopes of patient participating more in transfer as patient has decreased motor planning with new tasks. Patient required max A +2 for sit<>stand transfer with therapist blocking left knee and mod VCs for hand placement, increase forward trunk lean and increase erect posture upon standing x3 trials. With first trial, patient immediately sat down with poor hip control and trunk control; with 2nd attempt he was able to hold erect standing for 10-15 sec with min A +2 with good trunk control and erect posture; Patient does report increased lumbopelvic discomfort along SI joints. Patient required increased assistance and knee blocking on 3rd attempt due to fatigue;  At end of session instructed patient in sit<>Stand transfer with RW with hopes of completing a stand pivot transfer to wheelchair. Patient required max A +2 for sit<>Stand and was max A +3 for pivot to wheelchair as he was unable to pivot on RLE. He exhibited heavy left side lean with final transfer due to weakness and fatigue;   Instructed patient in bed mobility: Mod A for sit to sidelying with therapist cues to cross right foot under left foot and to use right foot to assist lifting left leg onto mat table; Patient then rolled hooklying, min A +1  Rolling to right side, min A +1 with mod VCs for UE positioning and LE positioning and to use mat table to pull self to right side. Patient requires significant increased time with cues for sequencing and coordination;   Transitioned sidelying to sitting with min A +1-2 with cues for LE positioning and UE positioning; initially required +2 assist to lift trunk off mat table, but progressed to +1 assist with increased time required to complete task. Pt slow with bed mobility requiring increased time to initiate movement due to decreased motor planning;   Exercise: Instructed patient in LE strengthening and flexibility exercise:  Patient hooklying: -bridges with arm by side x10 reps; pt  able to hold legs into neutral position; instructed patient in posterior pelvic rock prior to hip extension for better gluteal activation;  -SLR hip flexion x10 reps bilaterally with AAROM on LLE; able to lift RLE well without difficulty; able to initiate ROM on LLE but requires AAROM for lifting LLE due to weakness;   Sidelying: LLE clamshells 2x10 with partial ROM with min VCs to increase ROM with tolerance;   Following standing, had patient in hooklying and instructed patient in stretches to reduce back pain: Lumbar trunk rotation x5 reps with AAROM to increase ROM and flexibility;  Single knee to chest stretch 20 sec hold x1 rep bilaterally; discontinued as patient reports increased groin pain with increased hip flexion;   Response to treatment: Patient tolerated session well; He was able to exhibit better motor input and  ROM with LLE exercise today as compared to previous sessions. He is still unable to achieve full ROM against gravity due to weakness. Instructed patient in advanced therapeutic activity with transfers and bed mobility; Patient fatigues quickly but was able to achieve standing with RW. He exhibits better forward weight shift with transfers today compared to previous sessions;                    PT Education - 07/16/18 1333    Education Details  positioning, transfers, LE strengthening, bed mobility; HEP reinforced;     Person(s) Educated  Patient;Child(ren)    Methods  Explanation;Verbal cues    Comprehension  Verbalized understanding;Returned demonstration;Verbal cues required;Need further instruction       PT Short Term Goals - 07/08/18 1405      PT SHORT TERM GOAL #1   Title  Be independent with initial home exercise program for self-management of symptoms.    Baseline  HEP to be given at second session; advice to practice unsupported sitting at home (06/19/2018); 07/08/18: Performing at home    Time  2    Period  Weeks    Status  On-going    Target  Date  06/26/18        PT Long Term Goals - 07/08/18 1405      PT LONG TERM GOAL #1   Title  Patient will complete all bed mobility with min A to improve functional independence for getting in and out of bed and adjusting in bed.     Baseline  max A - total A reported by family (06/19/2018); 07/08/18: still requires maxA, dtr reports improvement in rolling since starting therapy    Time  12    Period  Weeks    Status  On-going    Target Date  09/11/18      PT LONG TERM GOAL #2   Title  Patient will complete sit <> stand transfer chair to chair with Min A and LRAD to improve functional independence for household and community mobility.     Baseline  max A +2 STS at W/C (06/12/2018); max A +2-3 STS or stand pivot transfer, able to complete more reps (06/19/2018); 07/08/18: not safe to attempt at this time    Time  12    Period  Weeks    Status  On-going    Target Date  09/11/18      PT LONG TERM GOAL #3   Title  Patient will navigate manual w/c with min A x 100 feet to improve mobility for household and short community distances.     Baseline  required total A (06/12/2018); able to roll 20 feet with min A (06/20/2018); 07/08/18: Pt can go approximately 10' without assist per family;    Time  12    Period  Weeks    Status  On-going    Target Date  09/11/18      PT LONG TERM GOAL #4   Title  Patient will complete car transfers W/C to family SUV with min A using LRAD to improve his abilty to participate in community activities.     Baseline  unable to complete car transfers at all (06/12/2018); unable to complete car transfers at all (06/19/2018).; 07/08/18: Unable to perform at this time    Time  12    Period  Weeks    Status  On-going    Target Date  09/11/18      PT LONG TERM  GOAL #5   Title  Patient will ambulate at least 150 feet with mod I using LRAD to improve mobility for household and community distances.     Baseline  unable to take steps (06/12/2018); able to shift R leg forward and back  with max A and RW at edge of plinth (06/19/2018); 07/08/18: unable to ambulate at this time.     Time  12    Period  Weeks    Status  On-going    Target Date  09/11/18      PT LONG TERM GOAL #6   Title  Patient will navigate 4 steps with min A and BUE support to improve his ability to access community and social participation.     Baseline  unable to complet stairs (06/12/2018); unable to complete stairs (06/19/2018); 07/08/18: unable to attempt at this time    Time  12    Period  Weeks    Status  On-going    Target Date  09/11/18            Plan - 07/17/18 0807    Clinical Impression Statement  Patient motivated and making good progress during session. Instructed patient in advanced LE strengthening exercise. He was able to participate more with transfers and bed mobility this session but does require increased time to complete tasks. Patient fatigues with additional transfers being unable to tolerate standing for >10-15 sec due to fatigue. patient does report increased lumbopelvic pain with prolonged standing. patient would benefit from additional skilled PT intervention to improve strength, balance and mobility;     Personal Factors and Comorbidities  Age;Comorbidity 3+    Comorbidities  Relevant past medical history and comorbidities include long term steroid use for lupus, CKD, chronic osteomyelitis, cervical pine stenosis, BPH, APS, alcohol abuse, peripheral artery disease, depression, GERD, obstructive sleep apnea, HTN, IBS, IgA deficiency, lumbar radiculopathy, RA, Stroke, toxic maculopathy in both eyes, systemic lupus erythematosus related syndrome, cardiac catheterization, carotid endarterectomy, cervical laminectomy, coronary angioplasty, Fusion C5-C7, knee arthroplasty, lumbar surgery.     Examination-Activity Limitations  Bed Mobility;Bend;Sit;Toileting;Stand;Stairs;Lift;Transfers;Squat;Locomotion Level;Carry;Dressing;Hygiene/Grooming;Continence    Examination-Participation  Restrictions  Yard Work;Interpersonal Relationship;Community Activity    Stability/Clinical Decision Making  Evolving/Moderate complexity    Rehab Potential  Fair    PT Frequency  3x / week    PT Duration  12 weeks    PT Treatment/Interventions  ADLs/Self Care Home Management;Electrical Stimulation;Moist Heat;Cryotherapy;Gait training;Stair training;Functional mobility training;Therapeutic exercise;Balance training;Neuromuscular re-education;Cognitive remediation;Patient/family education;Orthotic Fit/Training;Wheelchair mobility training;Manual techniques;Passive range of motion;Energy conservation;Joint Manipulations    PT Next Visit Plan  work on functional strength and establish HEP    Consulted and Agree with Plan of Care  Patient;Family member/caregiver    Family Member Consulted  daugher [name]       Patient will benefit from skilled therapeutic intervention in order to improve the following deficits and impairments:  Abnormal gait, Decreased activity tolerance, Decreased cognition, Decreased endurance, Decreased knowledge of use of DME, Decreased range of motion, Decreased skin integrity, Decreased strength, Impaired perceived functional ability, Impaired sensation, Impaired UE functional use, Improper body mechanics, Pain, Cardiopulmonary status limiting activity, Decreased balance, Decreased coordination, Decreased mobility, Difficulty walking, Impaired tone, Postural dysfunction  Visit Diagnosis: Muscle weakness (generalized)  Other lack of coordination  Difficulty in walking, not elsewhere classified     Problem List There are no active problems to display for this patient.   Chandan Fly PT, DPT 07/17/2018, 8:10 AM  [place] MAIN [facility] SERVICES [address]  [place], [place], [address] Phone: [phone]   Fax:  [phone]  Name: Kenneth Summers MRN: [id number] Date of Birth: 1945/05/26

## 2018-07-16 NOTE — Therapy (Addendum)
[place] MAIN [facility] SERVICES [address] [place], [place], [address] Phone: [phone]   Fax:  [phone]  Occupational Therapy Treatment  Patient Details  Name: Kenneth Summers MRN: [id number] Date of Birth: [birth year] No data recorded  Encounter Date: 07/16/2018  OT End of Session - 07/16/18 1514    Visit Number  13    Number of Visits  24    Date for OT Re-Evaluation  09/04/18    Authorization Type  Progress report period starting 06/12/2018    OT Start Time  1420    OT Stop Time  1505    OT Time Calculation (min)  45 min    Activity Tolerance  Patient tolerated treatment well    Behavior During Therapy  [facility] for tasks assessed/performed       Past Medical History:  Diagnosis Date  . Alcohol abuse   . APS (antiphospholipid syndrome) ([place])   . BPH (benign prostatic hyperplasia)   . CAD (coronary artery disease)   . Cellulitis   . Cervical spinal stenosis   . Chronic osteomyelitis ([place])   . CKD (chronic kidney disease)   . CKD (chronic kidney disease)   . Clostridium difficile diarrhea   . Depression   . Diplopia   . Fatigue   . GERD (gastroesophageal reflux disease)   . Hyperlipemia   . Hypertension   . Hypovitaminosis D   . IBS (irritable bowel syndrome)   . IgA deficiency ([place])   . Insomnia   . Left lumbosacral radiculopathy   . Moderate obstructive sleep apnea   . Osteomyelitis of foot ([place])   . PAD (peripheral artery disease) ([place])   . Pruritus   . RA (rheumatoid arthritis) ([place])   . Radiculopathy   . Restless legs syndrome   . RLS (restless legs syndrome)   . SLE (systemic lupus erythematosus related syndrome) ([place])   . Stroke ([place])   . Toxic maculopathy of both eyes     Past Surgical History:  Procedure Laterality Date  . CARDIAC CATHETERIZATION    . CAROTID ENDARTERECTOMY    . CERVICAL LAMINECTOMY    . CORONARY ANGIOPLASTY    . FRACTURE SURGERY    . fusion C5-6-7    . HERNIA REPAIR    . KNEE  ARTHROSCOPY    . TONSILLECTOMY      There were no vitals filed for this visit.  Subjective Assessment - 07/16/18 1513    Subjective   Pt.'s daughter was present during the session.    Pertinent History  Pt. is a 73 y.o. 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 Pt. PMHx includes: Multiple Falls, Lupus, DJD, Renal Abscess, CVA. Pt. resides with his wife. Pt.'s wife and daughter assist with ADLs. Pt. has caregivers in  for 2 hours a day, 6 days a week. Pt. received Rehab services in acute care, at SNF for STR, and [facility] services. Pt. is retired from [employer] for [employer] and [employer].    Patient Stated Goals  To regain use of his left UE, and do more for himself.    Pain Score  8     Pain Location  Neck    Pain Orientation  Left      OT TREATMENT    Neuro muscular re-education:  Pt. worked on grasping, and manipulating 1/2" washers from a magnetic dish using left hand 2pt. point grasp pattern. Pt. worked on reaching up, stabilizing,  and sustaining shoulder elevation while placing the washer over a small precise target on vertical dowels positioned at various angles, as well as dowels positioned diagonally to the left.   Therapeutic Exercise:  Pt. performed gross gripping with grip strengthener. Pt. worked on sustaining grip while grasping pegs and placing them in a container positioned to the left side of the pegboard. The gripper was placed in the 3rd resistive slot with the white resistive spring, and moved to the 2nd resistive spring. Pt. digit flexion, and extension using the circular resistive disc.  Response to treatment:  Pt. Improved task initiation, and attention to task requiring fewer cues. Pt. did report 8/10 left sided neck pain upon arrival to OT. The session was modified to accommodate this. Pt.'s back, and left posterior scapula region was resting on a hard metal area where the headrest inserts. Education was provided to the  pt., and daughter about positioning in the w/c.                              OT Education - 07/16/18 6294    Education Details  LE dressing skills    Person(s) Educated  Patient;Child(ren)    Methods  Explanation    Comprehension  Verbalized understanding;Returned demonstration          OT Long Term Goals - 07/10/18 1638      OT LONG TERM GOAL #1   Title  Pt. will increase left shoulder flexion AROM by 10 degrees to access cabinet/shelf.    Baseline  07/10/2018: AROM left shoulder flexion: 104    Time  12    Period  Weeks    Status  On-going    Target Date  09/04/18      OT LONG TERM GOAL #2   Title  Pt. will donn a shirt with Supervision.    Baseline  07/10/2018: Min-ModA    Time  12    Period  Weeks    Status  New    Target Date  09/04/18      OT LONG TERM GOAL #3   Title  Pt. will require ModA to perform LE dressing    Baseline  Eval: MaxA left, ModA with the right    Time  12    Period  Weeks    Status  On-going    Target Date  09/04/18      OT LONG TERM GOAL #4   Title  Pt. will improve left grip strength by 10# to be able to open a jar/container.    Baseline  07/10/2018: Left: 29, Right 40    Time  12    Period  Weeks    Status  On-going    Target Date  09/04/18      OT LONG TERM GOAL #5   Title  Pt. will improve left hand [facility] skills to be able to assit with buttoning/zipping.    Baseline  Eval: Left: 1 min & 52 sec. R: 56 sec.    Time  12    Period  Weeks    Status  New    Target Date  09/04/18      OT LONG TERM GOAL #6   Title  Pt. will demonstrate visual conmpensatory strategies during 100% of the time during self care.    Baseline  Continue    Time  12    Period  Weeks    Status  On-going  Target Date  09/04/18      OT LONG TERM GOAL #7   Title  Pt. will prepare a simple cold snack from w/c with supervision using cognitive compensatory strategies 100% of the time.    Baseline  Pt. is unable    Time  12    Period   Weeks    Status  On-going    Target Date  09/04/18      OT LONG TERM GOAL #8   Title  Pt. will accurately identify potential safety hazard using good safety awareness, and judgement 100% for ADLs, and IADLs.    Baseline  Continue    Time  12    Period  Weeks    Status  On-going    Target Date  09/04/18            Plan - 07/16/18 1515    Clinical Impression Statement  Pt. presents with improved initiation, improved attention during tasks requiring fewer cues, improved left sided awareness, an improved LUE strength, grip strength, pinch strength, and [place] skills.  Pt. continues to work on improving these skills in order to improve engagement in, and promote independence with ADLs, and IADLs.      Occupational performance deficits (Please refer to evaluation for details):  ADL's    Comorbidities Affecting Occupational Performance:  Presence of comorbidities impacting occupational performance    Comorbidities impacting occupational performance description:  Phyical, cognitive, visual,  medical comorbidities    Modification or Assistance to Complete Evaluation   Max significant modification of tasks or assist is necessary to complete    OT Frequency  3x / week    OT Duration  12 weeks    OT Treatment/Interventions  Self-care/ADL training;DME and/or AE instruction;Therapeutic exercise;Therapeutic activities;Moist Heat;Cognitive remediation/compensation;Neuromuscular education;Visual/perceptual remediation/compensation;Coping strategies training;Patient/family education;Passive range of motion;Psychosocial skills training;Energy conservation;Functional Mobility Training    Consulted and Agree with Plan of Care  Patient;Family member/caregiver    Family Member Consulted  Daughter [name]       Patient will benefit from skilled therapeutic intervention in order to improve the following deficits and impairments:  Cognitive Skills, Body Structure / Function / Physical Skills  Visit  Diagnosis: Muscle weakness (generalized)  Other lack of coordination    Problem List There are no active problems to display for this patient.   Harrel Carina, MS, OTR/L 07/16/2018, 3:22 PM  [place] MAIN [facility] SERVICES [address] [place], [place], [address] Phone: [phone]   Fax:  [phone]  Name: Kenneth Summers MRN: [id number] Date of Birth: 06-Jun-1945

## 2018-07-18 ENCOUNTER — Other Ambulatory Visit: Payer: Self-pay

## 2018-07-18 ENCOUNTER — Ambulatory Visit: Payer: Medicare Other | Admitting: Occupational Therapy

## 2018-07-18 ENCOUNTER — Ambulatory Visit: Payer: Medicare Other | Admitting: Physical Therapy

## 2018-07-18 ENCOUNTER — Encounter: Payer: Self-pay | Admitting: Physical Therapy

## 2018-07-18 DIAGNOSIS — R278 Other lack of coordination: Secondary | ICD-10-CM

## 2018-07-18 DIAGNOSIS — I69354 Hemiplegia and hemiparesis following cerebral infarction affecting left non-dominant side: Secondary | ICD-10-CM

## 2018-07-18 DIAGNOSIS — M6281 Muscle weakness (generalized): Secondary | ICD-10-CM

## 2018-07-18 DIAGNOSIS — R262 Difficulty in walking, not elsewhere classified: Secondary | ICD-10-CM

## 2018-07-18 NOTE — Therapy (Signed)
[place] MAIN [facility] SERVICES [address] [place], [place], [address] Phone: [phone]   Fax:  [phone]  Physical Therapy Treatment  Patient Details  Name: Kenneth Summers MRN: [id number] Date of Birth: 02/28/1945 Referring Provider (PT): Dr. [name], Guy Begin   Encounter Date: 07/18/2018  PT End of Session - 07/18/18 1326    Visit Number  15    Number of Visits  24    Date for PT Re-Evaluation  09/11/18    Authorization Type  Medicare reporting period from 06/12/2018    Authorization Time Period  Current cert period 7/89/3810 - 09/11/2018    PT Start Time  1331    PT Stop Time  1415    PT Time Calculation (min)  44 min    Equipment Utilized During Treatment  Gait belt    Activity Tolerance  Patient tolerated treatment well;Patient limited by fatigue    Behavior During Therapy  [facility] for tasks assessed/performed       Past Medical History:  Diagnosis Date  . Alcohol abuse   . APS (antiphospholipid syndrome) ([place])   . BPH (benign prostatic hyperplasia)   . CAD (coronary artery disease)   . Cellulitis   . Cervical spinal stenosis   . Chronic osteomyelitis ([place])   . CKD (chronic kidney disease)   . CKD (chronic kidney disease)   . Clostridium difficile diarrhea   . Depression   . Diplopia   . Fatigue   . GERD (gastroesophageal reflux disease)   . Hyperlipemia   . Hypertension   . Hypovitaminosis D   . IBS (irritable bowel syndrome)   . 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 Insomnia   . Left lumbosacral radiculopathy   . Moderate obstructive sleep apnea   . Osteomyelitis of foot ([place])   . PAD (peripheral artery disease) ([place])   . Pruritus   . RA (rheumatoid arthritis) ([place])   . Radiculopathy   . Restless legs syndrome   . RLS (restless legs syndrome)   . SLE (systemic lupus erythematosus related syndrome) ([place])   . Stroke ([place])   . Toxic maculopathy of both eyes     Past Surgical History:  Procedure Laterality Date  .  CARDIAC CATHETERIZATION    . CAROTID ENDARTERECTOMY    . CERVICAL LAMINECTOMY    . CORONARY ANGIOPLASTY    . FRACTURE SURGERY    . fusion C5-6-7    . HERNIA REPAIR    . KNEE ARTHROSCOPY    . TONSILLECTOMY      There were no vitals filed for this visit.  Subjective Assessment - 07/18/18 1341    Subjective  Patient reports increased fatigue today with soreness in gluteal area from prolonged sitting;    Patient is accompained by:  Family member   intermittantly; she thought she could not come in   Pertinent History  Patient is a 73 y.o. male who presents to outpatient physical therapy with a referral for medical diagnosis of CVA. This patient's chief complaints consist of left hemiplegia and overall deconditioning leading to the following functional deficits: dependent for ADLs, IADLs, unable to transfer to car, dependent transfers at home with hoyer lift, unable to walk or stand without significant assistance, difficulty with W/C navigation..    Limitations  Sitting;Lifting;Standing;House hold activities;Writing;Walking;Other (comment)   stairs, transferring   Diagnostic tests  Brain MRI 11/01/2017: IMPRESSION: 1. Acute right ACA territory infarct as demonstrated on prior CT imaging. No intracranial hemorrhage or significant mass  effect. 2. 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    Patient Stated Goals  wants to be able to walk again    Currently in Pain?  Yes    Pain Score  8     Pain Location  Buttocks    Pain Orientation  Lower    Pain Descriptors / Indicators  Aching;Sore    Pain Type  Chronic pain    Pain Onset  Today    Pain Frequency  Intermittent    Aggravating Factors   worse with prolonged sitting    Pain Relieving Factors  exercise/activity    Effect of Pain on Daily Activities  reduced sitting tolerance;    Multiple Pain Sites  No              TREATMENT: Therapeutic activity: Patient transferred squat pivot transfer  wheelchair<>Nutstep with mod A +2 with mod VCs for hand placement, weight shift forward and scoot;  Patient instructed in sit<>Stand transfer in parallel bars; Patient required max A +2 for sit<>stand transfer with therapist blocking left knee and mod VCs for hand placement, increase forward trunk lean and increase erect posture upon standing x5 trials. With first trial, patient immediately sat down with poor hip control and trunk control; with 2nd attempt he was able to hold erect standing for 10-15 sec with min A +2 with good trunk control and erect posture; by 4th attempt he was able to transfer sit<>Stand quicker with increased forward weight shift; however by 5th transfer he was fatigued and almost dependent for sit<>Stand.  While standing:  Instructed patient in weight shift side/side x5 reps x 3 sets; With lateral weight shifts patient able to initiate improved hip/knee extension; with weight shift with better erect standing posture, indicating improved erect posture with functional task;    Exercise: Nustep: BUE/BLE with leg attachment on LLE to facilitate better hip adductor control and reduce abduction/ER, level 2 x5 min with cues for sitting position and increase AROM in LE; Progressed to LE hip/knee extension x10 reps to increase quad activation; Patient able to initiate better extension with LLE activation with increased momentum but with hesitation has increased difficulty with muscle activation;    Response to treatment: Patient tolerated session well; He was able to exhibit better motor input and ROM with LLE exercise today as compared to previous sessions. He is still unable to achieve full ROM against gravity due to weakness.Patient fatigues with additional standing/transfers. He was able to initiate forward weight shift better today compared to previous sessions with cues for UE/LE placement;                       PT Education - 07/18/18 1326    Education  Details  LE strengthening, positioning, transfers, HEP reinforced;    Person(s) Educated  Patient;Child(ren)    Methods  Explanation;Verbal cues    Comprehension  Verbalized understanding;Verbal cues required;Need further instruction       PT Short Term Goals - 07/08/18 1405      PT SHORT TERM GOAL #1   Title  Be independent with initial home exercise program for self-management of symptoms.    Baseline  HEP to be given at second session; advice to practice unsupported sitting at home (06/19/2018); 07/08/18: Performing at home    Time  2    Period  Weeks    Status  On-going    Target Date  06/26/18  PT Long Term Goals - 07/08/18 1405      PT LONG TERM GOAL #1   Title  Patient will complete all bed mobility with min A to improve functional independence for getting in and out of bed and adjusting in bed.     Baseline  max A - total A reported by family (06/19/2018); 07/08/18: still requires maxA, dtr reports improvement in rolling since starting therapy    Time  12    Period  Weeks    Status  On-going    Target Date  09/11/18      PT LONG TERM GOAL #2   Title  Patient will complete sit <> stand transfer chair to chair with Min A and LRAD to improve functional independence for household and community mobility.     Baseline  max A +2 STS at W/C (06/12/2018); max A +2-3 STS or stand pivot transfer, able to complete more reps (06/19/2018); 07/08/18: not safe to attempt at this time    Time  12    Period  Weeks    Status  On-going    Target Date  09/11/18      PT LONG TERM GOAL #3   Title  Patient will navigate manual w/c with min A x 100 feet to improve mobility for household and short community distances.     Baseline  required total A (06/12/2018); able to roll 20 feet with min A (06/20/2018); 07/08/18: Pt can go approximately 10' without assist per family;    Time  12    Period  Weeks    Status  On-going    Target Date  09/11/18      PT LONG TERM GOAL #4   Title  Patient will  complete car transfers W/C to family SUV with min A using LRAD to improve his abilty to participate in community activities.     Baseline  unable to complete car transfers at all (06/12/2018); unable to complete car transfers at all (06/19/2018).; 07/08/18: Unable to perform at this time    Time  12    Period  Weeks    Status  On-going    Target Date  09/11/18      PT LONG TERM GOAL #5   Title  Patient will ambulate at least 150 feet with mod I using LRAD to improve mobility for household and community distances.     Baseline  unable to take steps (06/12/2018); able to shift R leg forward and back with max A and RW at edge of plinth (06/19/2018); 07/08/18: unable to ambulate at this time.     Time  12    Period  Weeks    Status  On-going    Target Date  09/11/18      PT LONG TERM GOAL #6   Title  Patient will navigate 4 steps with min A and BUE support to improve his ability to access community and social participation.     Baseline  unable to complet stairs (06/12/2018); unable to complete stairs (06/19/2018); 07/08/18: unable to attempt at this time    Time  12    Period  Weeks    Status  On-going    Target Date  09/11/18            Plan - 07/18/18 1532    Clinical Impression Statement  Patient motivated and tolerated session well. Instructed patient in advanced strengthening with Nustep, progressing to LE only with increased LLE muscle activation; patient is  slow to activate LLE due to weakness and poor motor planning; Instructed patient in sit<>Stand transfers and standing balance in parallel bars. He required increased cues for weight shift and positioning; He does fatigue with increased repetition. Patient seems to do better with functional tasks as compared to isolated LE exercise. He would benefit from additional PT intervention to improve strength and mobility;    Personal Factors and Comorbidities  Age;Comorbidity 3+    Comorbidities  Relevant past medical history and comorbidities  include long term steroid use for lupus, CKD, chronic osteomyelitis, cervical pine stenosis, BPH, APS, alcohol abuse, peripheral artery disease, depression, GERD, obstructive sleep apnea, HTN, IBS, IgA deficiency, lumbar radiculopathy, RA, Stroke, toxic maculopathy in both eyes, systemic lupus erythematosus related syndrome, cardiac catheterization, carotid endarterectomy, cervical laminectomy, coronary angioplasty, Fusion C5-C7, knee arthroplasty, lumbar surgery.     Examination-Activity Limitations  Bed Mobility;Bend;Sit;Toileting;Stand;Stairs;Lift;Transfers;Squat;Locomotion Level;Carry;Dressing;Hygiene/Grooming;Continence    Examination-Participation Restrictions  Yard Work;Interpersonal Relationship;Community Activity    Stability/Clinical Decision Making  Evolving/Moderate complexity    Rehab Potential  Fair    PT Frequency  3x / week    PT Duration  12 weeks    PT Treatment/Interventions  ADLs/Self Care Home Management;Electrical Stimulation;Moist Heat;Cryotherapy;Gait training;Stair training;Functional mobility training;Therapeutic exercise;Balance training;Neuromuscular re-education;Cognitive remediation;Patient/family education;Orthotic Fit/Training;Wheelchair mobility training;Manual techniques;Passive range of motion;Energy conservation;Joint Manipulations    PT Next Visit Plan  work on functional strength and establish HEP    Consulted and Agree with Plan of Care  Patient;Family member/caregiver    Family Member Consulted  daugher [name]       Patient will benefit from skilled therapeutic intervention in order to improve the following deficits and impairments:  Abnormal gait, Decreased activity tolerance, Decreased cognition, Decreased endurance, Decreased knowledge of use of DME, Decreased range of motion, Decreased skin integrity, Decreased strength, Impaired perceived functional ability, Impaired sensation, Impaired UE functional use, Improper body mechanics, Pain, Cardiopulmonary status  limiting activity, Decreased balance, Decreased coordination, Decreased mobility, Difficulty walking, Impaired tone, Postural dysfunction  Visit Diagnosis: Muscle weakness (generalized) - Plan:   Other lack of coordination - Plan:   Difficulty in walking, not elsewhere classified - Plan:      Problem List There are no active problems to display for this patient.   Izel Hochberg PT,DPT 07/18/2018, 3:35 PM  [place] MAIN [facility] SERVICES [address] [place], [place], [address] Phone: [phone]   Fax:  [phone]  Name: Kenneth Summers MRN: [id number] Date of Birth: 1945-08-12

## 2018-07-22 ENCOUNTER — Encounter: Payer: Self-pay | Admitting: Physical Therapy

## 2018-07-22 ENCOUNTER — Ambulatory Visit: Payer: Medicare Other

## 2018-07-22 ENCOUNTER — Other Ambulatory Visit: Payer: Self-pay

## 2018-07-22 DIAGNOSIS — M6281 Muscle weakness (generalized): Secondary | ICD-10-CM | POA: Diagnosis not present

## 2018-07-22 DIAGNOSIS — R262 Difficulty in walking, not elsewhere classified: Secondary | ICD-10-CM

## 2018-07-22 DIAGNOSIS — I69354 Hemiplegia and hemiparesis following cerebral infarction affecting left non-dominant side: Secondary | ICD-10-CM

## 2018-07-22 DIAGNOSIS — R296 Repeated falls: Secondary | ICD-10-CM

## 2018-07-22 NOTE — Therapy (Signed)
[place] MAIN [facility] SERVICES [address] [place], [place], [address] Phone: [phone]   Fax:  [phone]  Physical Therapy Treatment  Patient Details  Name: Kenneth Summers MRN: [id number] Date of Birth: Mar 22, 1945 Referring Provider (PT): Dr. [name], Guy Begin   Encounter Date: 07/22/2018  PT End of Session - 07/22/18 1427    Visit Number  16    Number of Visits  24    Date for PT Re-Evaluation  09/11/18    Authorization Type  Medicare reporting period from 06/12/2018    Authorization Time Period  Current cert period 9/45/0388 - 09/11/2018    Authorization - Visit Number  4    Authorization - Number of Visits  10    PT Start Time  1332    PT Stop Time  1416    PT Time Calculation (min)  44 min    Equipment Utilized During Treatment  Gait belt;Other (comment)   cloth gait belt   Activity Tolerance  Patient tolerated treatment well;Patient limited by fatigue    Behavior During Therapy  [facility] for tasks assessed/performed       Past Medical History:  Diagnosis Date  . Alcohol abuse   . APS (antiphospholipid syndrome) ([place])   . BPH (benign prostatic hyperplasia)   . CAD (coronary artery disease)   . Cellulitis   . Cervical spinal stenosis   . Chronic osteomyelitis ([place])   . CKD (chronic kidney disease)   . CKD (chronic kidney disease)   . Clostridium difficile diarrhea   . Depression   . Diplopia   . Fatigue   . GERD (gastroesophageal reflux disease)   . Hyperlipemia   . Hypertension   . Hypovitaminosis D   . IBS (irritable bowel syndrome)   . 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 Insomnia   . Left lumbosacral radiculopathy   . Moderate obstructive sleep apnea   . Osteomyelitis of foot ([place])   . PAD (peripheral artery disease) ([place])   . Pruritus   . RA (rheumatoid arthritis) ([place])   . Radiculopathy   . Restless legs syndrome   . RLS (restless legs syndrome)   . SLE (systemic lupus erythematosus related syndrome) ([place])   .  Stroke ([place])   . Toxic maculopathy of both eyes     Past Surgical History:  Procedure Laterality Date  . CARDIAC CATHETERIZATION    . CAROTID ENDARTERECTOMY    . CERVICAL LAMINECTOMY    . CORONARY ANGIOPLASTY    . FRACTURE SURGERY    . fusion C5-6-7    . HERNIA REPAIR    . KNEE ARTHROSCOPY    . TONSILLECTOMY      There were no vitals filed for this visit.  Subjective Assessment - 07/22/18 1424    Subjective  Patient reported that he is doing well today. Family member with pt throughout treatment.    Patient is accompained by:  Family member    Pertinent History  Patient is a 73 y.o. male who presents to outpatient physical therapy with a referral for medical diagnosis of CVA. This patient's chief complaints consist of left hemiplegia and overall deconditioning leading to the following functional deficits: dependent for ADLs, IADLs, unable to transfer to car, dependent transfers at home with hoyer lift, unable to walk or stand without significant assistance, difficulty with W/C navigation..    Limitations  Sitting;Lifting;Standing;House hold activities;Writing;Walking;Other (comment)    Diagnostic tests  Brain MRI 11/01/2017: IMPRESSION: 1. Acute right ACA territory  infarct as demonstrated on prior CT imaging. No intracranial hemorrhage or significant mass effect. 2. 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    Patient Stated Goals  wants to be able to walk again    Currently in Pain?  No/denies        TREATMENT:    Therapeutic exercise:  Seated reaching activities with tactile, visual and verbal cues throughout activity to improve posture, exercise technique, to attend to task. Reaching midline bilaterally x15 Reaching across midline bilaterally x10 CGA/supervision for safety  Seated hamstring stretch on L 2x45sec, pt intiially reported pain that improved with prolonged positioning  Supine quad set 2x10 with tactile cues Supine SAQ, AAROM  to initiate movement, PROM for TKE and eccentric lowering Supine heel slides AAROM for initiating movement, PROM to control lowering of leg Supine hip abduction/adduction, more motor control noted during hip adduction Abdominal crunch with low bolster underneath pt with good effort, abdominal muscle activation noted, encouraged to perform at home  Therapeutic activities: Patient transferred wheelchair to mat table toward his strong R side  progressed to mod A +2 for squat pivot, therapist blocking L knee for set up and transfer. Patient able to reach with RUE to assist with transfer, instructed patient to position trunk and hips in proper position prior to transfer for optimum mobility.  Patient instructed in sit<>Stand from elevated plinth tablePatient required max A +2 for sit<>stand transfer with therapist blocking left knee, RW x5. 4th and 5th attempt most successful with hip extension initiation, cues to avoid support on plinth behind knees, upright posture. mod VCs for hand placement, increase forward trunk lean and increase erect posture upon standing as well as weight bearing through LEs. Best eccentric control noted at end of last trial with constant verbal cueing.  Sitting <> sidelying <> supine during session, modAx2 for safety, step by step sequencing and constant verbal/tactile cues to enhance pt independence.      pt response to treatment/clinical impression: Patient continued to demonstrate motivation during session with verbal and visual cues, as well as participation from daughter. Pt was able to participate in supine exercises with extended time for motor control/recruitment, AAROM noted with PROM with fatigue and TKE. Patient with improved sit<> stand with repetition, maxAx2 and constant cueing. The patient would benefit from further skilled PT to continue to progress towards goals.       PT Education - 07/22/18 1425    Education Details  LE strengthening, exercise form,  sit<> stand technique, weight bearing in standing    Person(s) Educated  Patient    Methods  Explanation    Comprehension  Verbalized understanding;Returned demonstration;Verbal cues required       PT Short Term Goals - 07/08/18 1405      PT SHORT TERM GOAL #1   Title  Be independent with initial home exercise program for self-management of symptoms.    Baseline  HEP to be given at second session; advice to practice unsupported sitting at home (06/19/2018); 07/08/18: Performing at home    Time  2    Period  Weeks    Status  On-going    Target Date  06/26/18        PT Long Term Goals - 07/08/18 1405      PT LONG TERM GOAL #1   Title  Patient will complete all bed mobility with min A to improve functional independence for getting in and out of bed and adjusting in bed.  Baseline  max A - total A reported by family (06/19/2018); 07/08/18: still requires maxA, dtr reports improvement in rolling since starting therapy    Time  12    Period  Weeks    Status  On-going    Target Date  09/11/18      PT LONG TERM GOAL #2   Title  Patient will complete sit <> stand transfer chair to chair with Min A and LRAD to improve functional independence for household and community mobility.     Baseline  max A +2 STS at W/C (06/12/2018); max A +2-3 STS or stand pivot transfer, able to complete more reps (06/19/2018); 07/08/18: not safe to attempt at this time    Time  12    Period  Weeks    Status  On-going    Target Date  09/11/18      PT LONG TERM GOAL #3   Title  Patient will navigate manual w/c with min A x 100 feet to improve mobility for household and short community distances.     Baseline  required total A (06/12/2018); able to roll 20 feet with min A (06/20/2018); 07/08/18: Pt can go approximately 10' without assist per family;    Time  12    Period  Weeks    Status  On-going    Target Date  09/11/18      PT LONG TERM GOAL #4   Title  Patient will complete car transfers W/C to family SUV with  min A using LRAD to improve his abilty to participate in community activities.     Baseline  unable to complete car transfers at all (06/12/2018); unable to complete car transfers at all (06/19/2018).; 07/08/18: Unable to perform at this time    Time  12    Period  Weeks    Status  On-going    Target Date  09/11/18      PT LONG TERM GOAL #5   Title  Patient will ambulate at least 150 feet with mod I using LRAD to improve mobility for household and community distances.     Baseline  unable to take steps (06/12/2018); able to shift R leg forward and back with max A and RW at edge of plinth (06/19/2018); 07/08/18: unable to ambulate at this time.     Time  12    Period  Weeks    Status  On-going    Target Date  09/11/18      PT LONG TERM GOAL #6   Title  Patient will navigate 4 steps with min A and BUE support to improve his ability to access community and social participation.     Baseline  unable to complet stairs (06/12/2018); unable to complete stairs (06/19/2018); 07/08/18: unable to attempt at this time    Time  12    Period  Weeks    Status  On-going    Target Date  09/11/18            Plan - 07/22/18 1537    Clinical Impression Statement  Patient continued to demonstrate motivation during session with verbal and visual cues, as well as participation from daughter. Pt was able to participate in supine exercises with extended time for motor control/recruitment, AAROM noted with PROM with fatigue and TKE. Patient with improved sit<> stand with repetition, maxAx2 and constant cueing. The patient would benefit from further skilled PT to continue to progress towards goals.    Comorbidities  Relevant past medical  history and comorbidities include long term steroid use for lupus, CKD, chronic osteomyelitis, cervical pine stenosis, BPH, APS, alcohol abuse, peripheral artery disease, depression, GERD, obstructive sleep apnea, HTN, IBS, IgA deficiency, lumbar radiculopathy, RA, Stroke, toxic maculopathy  in both eyes, systemic lupus erythematosus related syndrome, cardiac catheterization, carotid endarterectomy, cervical laminectomy, coronary angioplasty, Fusion C5-C7, knee arthroplasty, lumbar surgery.     Examination-Activity Limitations  Bed Mobility;Bend;Sit;Toileting;Stand;Stairs;Lift;Transfers;Squat;Locomotion Level;Carry;Dressing;Hygiene/Grooming;Continence    Examination-Participation Restrictions  Yard Work;Interpersonal Relationship;Community Activity    Rehab Potential  Fair    PT Frequency  3x / week    PT Duration  12 weeks    PT Treatment/Interventions  ADLs/Self Care Home Management;Electrical Stimulation;Moist Heat;Cryotherapy;Gait training;Stair training;Functional mobility training;Therapeutic exercise;Balance training;Neuromuscular re-education;Cognitive remediation;Patient/family education;Orthotic Fit/Training;Wheelchair mobility training;Manual techniques;Passive range of motion;Energy conservation;Joint Manipulations    PT Next Visit Plan  work on functional strength and establish HEP    PT Home Exercise Plan  will establish next session    Consulted and Agree with Plan of Care  Patient;Family member/caregiver    Family Member Consulted  daugher [name]       Patient will benefit from skilled therapeutic intervention in order to improve the following deficits and impairments:  Abnormal gait, Decreased activity tolerance, Decreased cognition, Decreased endurance, Decreased knowledge of use of DME, Decreased range of motion, Decreased skin integrity, Decreased strength, Impaired perceived functional ability, Impaired sensation, Impaired UE functional use, Improper body mechanics, Pain, Cardiopulmonary status limiting activity, Decreased balance, Decreased coordination, Decreased mobility, Difficulty walking, Impaired tone, Postural dysfunction  Visit Diagnosis: Muscle weakness (generalized)   Difficulty in walking, not elsewhere classified   Hemiplegia and hemiparesis  following cerebral infarction affecting left non-dominant side (HCC)   Repeated falls      Problem List There are no active problems to display for this patient.   Lieutenant Diego PT, DPT 3:43 PM,07/22/18 [place] MAIN [facility] SERVICES [address] [place], [place], [address] Phone: [phone]   Fax:  [phone]  Name: Kenneth Summers MRN: [id number] Date of Birth: Oct 13, 1945

## 2018-07-23 ENCOUNTER — Ambulatory Visit: Payer: Medicare Other | Admitting: Occupational Therapy

## 2018-07-23 ENCOUNTER — Encounter: Payer: Self-pay | Admitting: Occupational Therapy

## 2018-07-23 ENCOUNTER — Encounter: Payer: Self-pay | Admitting: Physical Therapy

## 2018-07-23 ENCOUNTER — Ambulatory Visit: Payer: Medicare Other | Admitting: Physical Therapy

## 2018-07-23 ENCOUNTER — Other Ambulatory Visit: Payer: Self-pay

## 2018-07-23 DIAGNOSIS — R262 Difficulty in walking, not elsewhere classified: Secondary | ICD-10-CM

## 2018-07-23 DIAGNOSIS — R278 Other lack of coordination: Secondary | ICD-10-CM

## 2018-07-23 DIAGNOSIS — M6281 Muscle weakness (generalized): Secondary | ICD-10-CM | POA: Diagnosis not present

## 2018-07-23 NOTE — Therapy (Signed)
[place] MAIN [facility] SERVICES [address] [place], [place], [address] Phone: [phone]   Fax:  [phone]  Occupational Therapy Treatment  Patient Details  Name: Kenneth Summers MRN: [id number] Date of Birth: [birth year] No data recorded  Encounter Date: 07/18/2018  OT End of Session - 07/23/18 1247    Visit Number  14    Number of Visits  24    Date for OT Re-Evaluation  09/04/18    Authorization Type  Progress report period starting 06/12/2018    OT Start Time  1430    OT Stop Time  1515    OT Time Calculation (min)  45 min    Activity Tolerance  Patient tolerated treatment well    Behavior During Therapy  [facility] for tasks assessed/performed       Past Medical History:  Diagnosis Date  . Alcohol abuse   . APS (antiphospholipid syndrome) ([place])   . BPH (benign prostatic hyperplasia)   . CAD (coronary artery disease)   . Cellulitis   . Cervical spinal stenosis   . Chronic osteomyelitis ([place])   . CKD (chronic kidney disease)   . CKD (chronic kidney disease)   . Clostridium difficile diarrhea   . Depression   . Diplopia   . Fatigue   . GERD (gastroesophageal reflux disease)   . Hyperlipemia   . Hypertension   . Hypovitaminosis D   . IBS (irritable bowel syndrome)   . IgA deficiency ([place])   . Insomnia   . Left lumbosacral radiculopathy   . Moderate obstructive sleep apnea   . Osteomyelitis of foot ([place])   . PAD (peripheral artery disease) ([place])   . Pruritus   . RA (rheumatoid arthritis) ([place])   . Radiculopathy   . Restless legs syndrome   . RLS (restless legs syndrome)   . SLE (systemic lupus erythematosus related syndrome) ([place])   . Stroke ([place])   . Toxic maculopathy of both eyes     Past Surgical History:  Procedure Laterality Date  . CARDIAC CATHETERIZATION    . CAROTID ENDARTERECTOMY    . CERVICAL LAMINECTOMY    . CORONARY ANGIOPLASTY    . FRACTURE SURGERY    . fusion C5-6-7    . HERNIA REPAIR    . KNEE  ARTHROSCOPY    . TONSILLECTOMY      There were no vitals filed for this visit.  Subjective Assessment - 07/23/18 1245    Subjective   Daughter was present during patient's session.  Patient reports he has been working on doing more with getting dressed with decreased assistance.    Pertinent History  Pt. is a 73 y.o. 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 Pt. PMHx includes: Multiple Falls, Lupus, DJD, Renal Abscess, CVA. Pt. resides with his wife. Pt.'s wife and daughter assist with ADLs. Pt. has caregivers in  for 2 hours a day, 6 days a week. Pt. received Rehab services in acute care, at SNF for STR, and [place] services. Pt. is retired from [employer] for [employer] and [employer].    Patient Stated Goals  To regain use of his left UE, and do more for himself.    Currently in Pain?  No/denies    Pain Score  0-No pain       Therapeutic Exercise: Patient seen this date for ROM of LUE, all joints and planes followed by strengthening with 2# dowel for shoulder flexion, ABD,  ADD, chest press, forwards and backwards circles, elbow flexion/extension for 12 repetitions for 2 sets each, therapist demo along with cues for proper form and technique.    Neuromuscular Reeducation: Patient performing multi directional reaching tasks with left UE with cues for attention at times to left, weight shifting and grasping patterns.   Patient encouraged to use left hand during self care tasks such as applying toothpaste to brush, folding clothes, helping in the kitchen.  Patient seen for manipulation of [place] discs for turning/flipping with cues for isolated finger and hand movements.  Discussed ways to incorporate more use of left UE into daily tasks at home and patient and daughter in agreeance and demo understanding.            Response to tx:  Patient continues to demonstrate improved left sided attention during tasks. After increased focus on reaching  and engaging UE into activities, patient complained of some soreness in his left neck and shoulder and daughter applied Biofreeze to help.  He tends to initiate right hand use with most tasks and we really focused on performing tasks with left hand this date, no spontaneous use noted but when cued, he responds well.  Recommended patient work towards tasks that require bilateral hand use to help with neuromuscular reeducation and improve spontaneous use of left UE.  He is performing better with donning and doffing his shirt at home and improved bridging when performing lower body dressing.  Continue to work towards goals to [employer] and independence in daily tasks.         OT Education - 07/23/18 1247    Education Details  using left hand during functional tasks at home throughout the week    Person(s) Educated  Patient;Child(ren)    Methods  Explanation;Demonstration    Comprehension  Verbalized understanding;Returned demonstration          OT Long Term Goals - 07/10/18 1638      OT LONG TERM GOAL #1   Title  Pt. will increase left shoulder flexion AROM by 10 degrees to access cabinet/shelf.    Baseline  07/10/2018: AROM left shoulder flexion: 104    Time  12    Period  Weeks    Status  On-going    Target Date  09/04/18      OT LONG TERM GOAL #2   Title  Pt. will donn a shirt with Supervision.    Baseline  07/10/2018: Min-ModA    Time  12    Period  Weeks    Status  New    Target Date  09/04/18      OT LONG TERM GOAL #3   Title  Pt. will require ModA to perform LE dressing    Baseline  Eval: MaxA left, ModA with the right    Time  12    Period  Weeks    Status  On-going    Target Date  09/04/18      OT LONG TERM GOAL #4   Title  Pt. will improve left grip strength by 10# to be able to open a jar/container.    Baseline  07/10/2018: Left: 29, Right 40    Time  12    Period  Weeks    Status  On-going    Target Date  09/04/18      OT LONG TERM GOAL #5   Title  Pt.  will improve left hand [facility] skills to be able to assit with buttoning/zipping.  Baseline  Eval: Left: 1 min & 52 sec. R: 56 sec.    Time  12    Period  Weeks    Status  New    Target Date  09/04/18      OT LONG TERM GOAL #6   Title  Pt. will demonstrate visual conmpensatory strategies during 100% of the time during self care.    Baseline  Continue    Time  12    Period  Weeks    Status  On-going    Target Date  09/04/18      OT LONG TERM GOAL #7   Title  Pt. will prepare a simple cold snack from w/c with supervision using cognitive compensatory strategies 100% of the time.    Baseline  Pt. is unable    Time  12    Period  Weeks    Status  On-going    Target Date  09/04/18      OT LONG TERM GOAL #8   Title  Pt. will accurately identify potential safety hazard using good safety awareness, and judgement 100% for ADLs, and IADLs.    Baseline  Continue    Time  12    Period  Weeks    Status  On-going    Target Date  09/04/18            Plan - 07/23/18 1248    Clinical Impression Statement  Patient continues to demonstrate improved left sided attention during tasks.  He tends to initiate right hand use with most tasks and we really focused on performing tasks with left hand this date, no spontaneous use noted but when cued, he responds well.  Recommended patient work towards tasks that require bilateral hand use to help with neuromuscular reeducation and improve spontaneous use of left UE.  He is performing better with donning and doffing his shirt at home and improved bridging when performing lower body dressing.  Continue to work towards goals to [employer] and independence in daily tasks.    Occupational performance deficits (Please refer to evaluation for details):  ADL's    Body Structure / Function / Physical Skills  UE functional use;Coordination;FMC;Dexterity;Strength;ROM    Cognitive Skills  Attention;Memory;Emotional;Problem Solve;Safety Awareness    Rehab  Potential  Good    Comorbidities Affecting Occupational Performance:  Presence of comorbidities impacting occupational performance    Comorbidities impacting occupational performance description:  Phyical, cognitive, visual,  medical comorbidities    Modification or Assistance to Complete Evaluation   Max significant modification of tasks or assist is necessary to complete    OT Frequency  3x / week    OT Duration  12 weeks    OT Treatment/Interventions  Self-care/ADL training;DME and/or AE instruction;Therapeutic exercise;Therapeutic activities;Moist Heat;Cognitive remediation/compensation;Neuromuscular education;Visual/perceptual remediation/compensation;Coping strategies training;Patient/family education;Passive range of motion;Psychosocial skills training;Energy conservation;Functional Mobility Training    Consulted and Agree with Plan of Care  Patient;Family member/caregiver    Family Member Consulted  Daughter [name]       Patient will benefit from skilled therapeutic intervention in order to improve the following deficits and impairments:   Body Structure / Function / Physical Skills: UE functional use, Coordination, FMC, Dexterity, Strength, ROM Cognitive Skills: Attention, Memory, Emotional, Problem Solve, Safety Awareness     Visit Diagnosis: 1. Muscle weakness (generalized)   2. Other lack of coordination   3. Hemiplegia and hemiparesis following cerebral infarction affecting left non-dominant side ([place])       Problem List There are  no active problems to display for this patient.  Achilles Dunk, OTR/L, CLT  , 07/23/2018, 12:53 PM  [place] MAIN [facility] SERVICES [address] [place], [place], [address] Phone: [phone]   Fax:  [phone]  Name: RICHY SPRADLEY MRN: [id number] Date of Birth: Jun 07, 1945

## 2018-07-23 NOTE — Therapy (Signed)
[place] MAIN [facility] SERVICES [address] [place], [place], [address] Phone: [phone]   Fax:  [phone]  Occupational Therapy Treatment  Patient Details  Name: Kenneth Summers MRN: [id number] Date of Birth: 04-01-1945 No data recorded  Encounter Date: 07/23/2018  OT End of Session - 07/23/18 1425    Visit Number  15    Number of Visits  24    Date for OT Re-Evaluation  09/04/18    OT Start Time  1333    OT Stop Time  1415    OT Time Calculation (min)  42 min    Activity Tolerance  Patient tolerated treatment well    Behavior During Therapy  [facility] for tasks assessed/performed       Past Medical History:  Diagnosis Date  . Alcohol abuse   . APS (antiphospholipid syndrome) ([place])   . BPH (benign prostatic hyperplasia)   . CAD (coronary artery disease)   . Cellulitis   . Cervical spinal stenosis   . Chronic osteomyelitis ([place])   . CKD (chronic kidney disease)   . CKD (chronic kidney disease)   . Clostridium difficile diarrhea   . Depression   . Diplopia   . Fatigue   . GERD (gastroesophageal reflux disease)   . Hyperlipemia   . Hypertension   . Hypovitaminosis D   . IBS (irritable bowel syndrome)   . 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 Insomnia   . Left lumbosacral radiculopathy   . Moderate obstructive sleep apnea   . Osteomyelitis of foot ([place])   . PAD (peripheral artery disease) ([place])   . Pruritus   . RA (rheumatoid arthritis) ([place])   . Radiculopathy   . Restless legs syndrome   . RLS (restless legs syndrome)   . SLE (systemic lupus erythematosus related syndrome) ([place])   . Stroke ([place])   . Toxic maculopathy of both eyes     Past Surgical History:  Procedure Laterality Date  . CARDIAC CATHETERIZATION    . CAROTID ENDARTERECTOMY    . CERVICAL LAMINECTOMY    . CORONARY ANGIOPLASTY    . FRACTURE SURGERY    . fusion C5-6-7    . HERNIA REPAIR    . KNEE ARTHROSCOPY    . TONSILLECTOMY      There were no vitals filed  for this visit.  Subjective Assessment - 07/23/18 1423    Subjective   Pt. has new shoes, and has elastic laces in place.    Pertinent History  Pt. is a 73 y.o. 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 Pt. PMHx includes: Multiple Falls, Lupus, DJD, Renal Abscess, CVA. Pt. resides with his wife. Pt.'s wife and daughter assist with ADLs. Pt. has caregivers in  for 2 hours a day, 6 days a week. Pt. received Rehab services in acute care, at SNF for STR, and [facility]. Pt. is retired from [employer] for [employer] and [employer].    Currently in Pain?  No/denies      OT TREATMENT    Therapeutic Exercise:  Pt. performed 2# dowel ex. For UE strengthening secondary to weakness. Bilateral shoulder flexion, chest press, circular patterns, and elbow flexion/extension were performed. Pt. Requires cues to keep the dowel straight. 2# dumbbell ex. for elbow flexion and extension, forearm supination/pronation, wrist flexion/extension, and radial deviation. Pt. requires rest breaks and verbal cues for proper technique. Pt. performed gross gripping with grip strengthener. Pt. worked on sustaining  grip while grasping pegs and reaching at various heights. Gripper was placed in the 2nd resistive slot with the white resistive spring. Pt. Worked on pinch strengthening in the left hand for lateral, and 3pt. pinch using yellow, red, and green resistive clips. Pt. worked on placing the clips at various vertical and horizontal angles. Tactile and verbal cues were required for eliciting the desired movement. Pt. Worked on visual scanning to the left to place the clips onto a specified number.                         OT Education - 07/23/18 1424    Education Details  left sided awareness, engaging the left hand more during tasks.    Person(s) Educated  Patient    Methods  Demonstration;Explanation    Comprehension  Verbalized understanding;Returned  demonstration          OT Long Term Goals - 07/10/18 1638      OT LONG TERM GOAL #1   Title  Pt. will increase left shoulder flexion AROM by 10 degrees to access cabinet/shelf.    Baseline  07/10/2018: AROM left shoulder flexion: 104    Time  12    Period  Weeks    Status  On-going    Target Date  09/04/18      OT LONG TERM GOAL #2   Title  Pt. will donn a shirt with Supervision.    Baseline  07/10/2018: Min-ModA    Time  12    Period  Weeks    Status  New    Target Date  09/04/18      OT LONG TERM GOAL #3   Title  Pt. will require ModA to perform LE dressing    Baseline  Eval: MaxA left, ModA with the right    Time  12    Period  Weeks    Status  On-going    Target Date  09/04/18      OT LONG TERM GOAL #4   Title  Pt. will improve left grip strength by 10# to be able to open a jar/container.    Baseline  07/10/2018: Left: 29, Right 40    Time  12    Period  Weeks    Status  On-going    Target Date  09/04/18      OT LONG TERM GOAL #5   Title  Pt. will improve left hand [facility] skills to be able to assit with buttoning/zipping.    Baseline  Eval: Left: 1 min & 52 sec. R: 56 sec.    Time  12    Period  Weeks    Status  New    Target Date  09/04/18      OT LONG TERM GOAL #6   Title  Pt. will demonstrate visual conmpensatory strategies during 100% of the time during self care.    Baseline  Continue    Time  12    Period  Weeks    Status  On-going    Target Date  09/04/18      OT LONG TERM GOAL #7   Title  Pt. will prepare a simple cold snack from w/c with supervision using cognitive compensatory strategies 100% of the time.    Baseline  Pt. is unable    Time  12    Period  Weeks    Status  On-going    Target Date  09/04/18  OT LONG TERM GOAL #8   Title  Pt. will accurately identify potential safety hazard using good safety awareness, and judgement 100% for ADLs, and IADLs.    Baseline  Continue    Time  12    Period  Weeks    Status  On-going    Target  Date  09/04/18            Plan - 07/23/18 1426    Clinical Impression Statement  Pt.'s daughter reports that the pt. is not using  his left hand as much during transfers.Pt. is improving left sided awareness, and attention however requires consistent cues for left sided awareness. Pt. is improving with initiating more with his left during during tasks. Pt. continues to work on improving, and increasing engagement of the left UE during ADL, and IADL tasks.    Occupational performance deficits (Please refer to evaluation for details):  ADL's    Body Structure / Function / Physical Skills  UE functional use;Coordination;FMC;Dexterity;Strength;ROM    Cognitive Skills  Attention;Memory;Emotional;Problem Solve;Safety Awareness    Comorbidities impacting occupational performance description:  Phyical, cognitive, visual,  medical comorbidities    Modification or Assistance to Complete Evaluation   Max significant modification of tasks or assist is necessary to complete    OT Frequency  3x / week    OT Treatment/Interventions  Self-care/ADL training;DME and/or AE instruction;Therapeutic exercise;Therapeutic activities;Moist Heat;Cognitive remediation/compensation;Neuromuscular education;Visual/perceptual remediation/compensation;Coping strategies training;Patient/family education;Passive range of motion;Psychosocial skills training;Energy conservation;Functional Mobility Training       Patient will benefit from skilled therapeutic intervention in order to improve the following deficits and impairments:   Body Structure / Function / Physical Skills: UE functional use, Coordination, FMC, Dexterity, Strength, ROM Cognitive Skills: Attention, Memory, Emotional, Problem Solve, Safety Awareness     Visit Diagnosis: 1. Muscle weakness (generalized)   2. Other lack of coordination       Problem List There are no active problems to display for this patient.   Harrel Carina, MS,  OTR/L 07/23/2018, 2:37 PM  [place] MAIN [facility] SERVICES [address] [place], [place], [address] Phone: [phone]   Fax:  [phone]  Name: Kenneth Summers MRN: [id number] Date of Birth: February 27, 1945

## 2018-07-23 NOTE — Therapy (Signed)
[place] MAIN [facility] SERVICES [address] [place], [place], [address] Phone: [phone]   Fax:  [phone]  Physical Therapy Treatment  Patient Details  Name: Kenneth Summers MRN: [id number] Date of Birth: [birth year] Referring Provider (PT): Dr. [name], Guy Begin   Encounter Date: 07/23/2018  PT End of Session - 07/24/18 1319    Visit Number  17    Number of Visits  24    Date for PT Re-Evaluation  09/11/18    Authorization Type  Medicare reporting period from 06/12/2018    Authorization Time Period  Current cert period 5/63/1497 - 09/11/2018    Authorization - Visit Number  4    Authorization - Number of Visits  10    PT Start Time  0263    PT Stop Time  1515    PT Time Calculation (min)  43 min    Equipment Utilized During Treatment  Gait belt;Other (comment)   cloth gait belt   Activity Tolerance  Patient tolerated treatment well;Patient limited by fatigue    Behavior During Therapy  [facility] for tasks assessed/performed       Past Medical History:  Diagnosis Date  . Alcohol abuse   . APS (antiphospholipid syndrome) ([place])   . BPH (benign prostatic hyperplasia)   . CAD (coronary artery disease)   . Cellulitis   . Cervical spinal stenosis   . Chronic osteomyelitis ([place])   . CKD (chronic kidney disease)   . CKD (chronic kidney disease)   . Clostridium difficile diarrhea   . Depression   . Diplopia   . Fatigue   . GERD (gastroesophageal reflux disease)   . Hyperlipemia   . Hypertension   . Hypovitaminosis D   . IBS (irritable bowel syndrome)   . IgA deficiency ([place])   . Insomnia   . Left lumbosacral radiculopathy   . Moderate obstructive sleep apnea   . Osteomyelitis of foot ([place])   . PAD (peripheral artery disease) ([place])   . Pruritus   . RA (rheumatoid arthritis) ([place])   . Radiculopathy   . Restless legs syndrome   . RLS (restless legs syndrome)   . SLE (systemic lupus erythematosus related syndrome) ([place])   .  Stroke ([place])   . Toxic maculopathy of both eyes     Past Surgical History:  Procedure Laterality Date  . CARDIAC CATHETERIZATION    . CAROTID ENDARTERECTOMY    . CERVICAL LAMINECTOMY    . CORONARY ANGIOPLASTY    . FRACTURE SURGERY    . fusion C5-6-7    . HERNIA REPAIR    . KNEE ARTHROSCOPY    . TONSILLECTOMY      There were no vitals filed for this visit.  Subjective Assessment - 07/23/18 1427    Subjective  Patient reports doing well; He reports increased sacral discomfort from prolonged sitting;    Patient is accompained by:  Family member    Pertinent History  Patient is a 73 y.o. male who presents to outpatient physical therapy with a referral for medical diagnosis of CVA. This patient's chief complaints consist of left hemiplegia and overall deconditioning leading to the following functional deficits: dependent for ADLs, IADLs, unable to transfer to car, dependent transfers at home with hoyer lift, unable to walk or stand without significant assistance, difficulty with W/C navigation..    Limitations  Sitting;Lifting;Standing;House hold activities;Writing;Walking;Other (comment)    Diagnostic tests  Brain MRI 11/01/2017: IMPRESSION: 1. Acute right ACA territory infarct as  demonstrated on prior CT imaging. No intracranial hemorrhage or significant mass effect. 2. 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    Patient Stated Goals  wants to be able to walk again    Currently in Pain?  Yes    Pain Score  7     Pain Location  Buttocks    Pain Orientation  Lower    Pain Descriptors / Indicators  Aching;Sore    Pain Type  Chronic pain    Pain Onset  More than a month ago    Pain Frequency  Intermittent    Aggravating Factors   worse with prolonged sitting;    Pain Relieving Factors  exercise/activity    Effect of Pain on Daily Activities  decreased sitting tolerance;    Multiple Pain Sites  Yes    Pain Score  6    Pain Location  Leg    Pain  Orientation  Left    Pain Descriptors / Indicators  Aching;Sore    Pain Type  Acute pain    Pain Onset  Yesterday    Pain Frequency  Intermittent    Aggravating Factors   unsure    Pain Relieving Factors  radiates down LE    Effect of Pain on Daily Activities  no change in activity;            TREATMENT: Therapeutic activity: Patient transferred squad pivot wheelchair to mat table, mod A +2 with mod VCs for trunk control, hand placement and positioning; Provided patient with increased time to scoot forward to edge of chair and to shift hips to better position prior to transfer. He required mod A for positioning feet for better position;   Patient transferred squad pivot transfer mat table to wheelchair with mod A +2 with mod VCs for trunk position and hand placement; He requires therapist assist to block knee for better positioning;   Transferred squat pivot transfer wheelchair<>nustep x2 reps with mod VCs for hand placement and positioning, mod A +2 for safety;  Instructed patient in bed mobility: Mod A for sit to sidelying with therapist cues to cross right foot under left foot and to use right foot to assist lifting left leg onto mat table; Patient then rolled hooklying, min A +1  Rolling to right side, supervision with increased time required and with mod VCs for UE positioning and LE positioning and to use mat table to pull self to right side. Patient requires significant increased time with cues for sequencing and coordination;   Transitioned sidelying to sitting with min A +1-2 with cues for LE positioning and UE positioning; initially required +2 assist to lift trunk off mat table, but progressed to +1 assist with increased time required to complete task. Pt slow with bed mobility requiring increased time to initiate movement due to decreased motor planning;   Exercise: Instructed patient in LE strengthening and flexibility exercise:  Patient hooklying: -bridges with arm  by side x15 reps; pt able to hold legs into neutral position; instructed patient in posterior pelvic rock prior to hip extension for better gluteal activation;  Bridges with right leg crossed over left leg with arms across chest x10 reps; -SLR hip flexion x10 reps with AAROM on LLE; able to initiate ROM on LLE but requires AAROM for lifting LLE due to weakness;   NMES: Exercise: Hooklying with bolster under legs: PT applied NMES to LLE quad muscle, large muscle group atrophy setting, 5 sec on, 10 sec  off x10 min at tolerated intensity (#[id number]) with mod VCs to increase quad muscle activation during NMES activation; Patient able to initiate quad activation but does require AAROM to achieve full SAQ ROM despite NMES assistance. He denies any pain or discomfort.Overall he was able to exhibit better muscle activation during today's session as compared to previous session with increased AROM.   Nustep: BLE and BUE level 0 x5 min with cues to keep steps per minute >50 with left leg attachment; BLE only x10 steps requiring mod VCs for sequencing and positioning; Patient able to activate LLE muscle activation with reciprocal motion requiring increased time for activity;   Response to treatment: Patient tolerated session well; He was able to exhibit better motor input and ROM with LLE exercise today as compared to previous sessions. He is still unable to achieve full ROM against gravity due to weakness. Instructed patient in advanced therapeutic activity with transfers and bed mobility; He exhibits better forward weight shift with transfers today compared to previous sessions;                   PT Education - 07/24/18 1319    Education Details  LE strengthening, transfers, HEP    Person(s) Educated  Patient;Child(ren)    Methods  Explanation;Verbal cues    Comprehension  Verbalized understanding;Returned demonstration;Verbal cues required;Need further instruction       PT Short Term  Goals - 07/08/18 1405      PT SHORT TERM GOAL #1   Title  Be independent with initial home exercise program for self-management of symptoms.    Baseline  HEP to be given at second session; advice to practice unsupported sitting at home (06/19/2018); 07/08/18: Performing at home    Time  2    Period  Weeks    Status  On-going    Target Date  06/26/18        PT Long Term Goals - 07/08/18 1405      PT LONG TERM GOAL #1   Title  Patient will complete all bed mobility with min A to improve functional independence for getting in and out of bed and adjusting in bed.     Baseline  max A - total A reported by family (06/19/2018); 07/08/18: still requires maxA, dtr reports improvement in rolling since starting therapy    Time  12    Period  Weeks    Status  On-going    Target Date  09/11/18      PT LONG TERM GOAL #2   Title  Patient will complete sit <> stand transfer chair to chair with Min A and LRAD to improve functional independence for household and community mobility.     Baseline  max A +2 STS at W/C (06/12/2018); max A +2-3 STS or stand pivot transfer, able to complete more reps (06/19/2018); 07/08/18: not safe to attempt at this time    Time  12    Period  Weeks    Status  On-going    Target Date  09/11/18      PT LONG TERM GOAL #3   Title  Patient will navigate manual w/c with min A x 100 feet to improve mobility for household and short community distances.     Baseline  required total A (06/12/2018); able to roll 20 feet with min A (06/20/2018); 07/08/18: Pt can go approximately 10' without assist per family;    Time  12    Period  Weeks  Status  On-going    Target Date  09/11/18      PT LONG TERM GOAL #4   Title  Patient will complete car transfers W/C to family SUV with min A using LRAD to improve his abilty to participate in community activities.     Baseline  unable to complete car transfers at all (06/12/2018); unable to complete car transfers at all (06/19/2018).; 07/08/18: Unable to  perform at this time    Time  12    Period  Weeks    Status  On-going    Target Date  09/11/18      PT LONG TERM GOAL #5   Title  Patient will ambulate at least 150 feet with mod I using LRAD to improve mobility for household and community distances.     Baseline  unable to take steps (06/12/2018); able to shift R leg forward and back with max A and RW at edge of plinth (06/19/2018); 07/08/18: unable to ambulate at this time.     Time  12    Period  Weeks    Status  On-going    Target Date  09/11/18      PT LONG TERM GOAL #6   Title  Patient will navigate 4 steps with min A and BUE support to improve his ability to access community and social participation.     Baseline  unable to complet stairs (06/12/2018); unable to complete stairs (06/19/2018); 07/08/18: unable to attempt at this time    Time  12    Period  Weeks    Status  On-going    Target Date  09/11/18            Plan - 07/24/18 1319    Clinical Impression Statement  Patient motivated and tolerated session well. He was able to exhibit better muscle activation with improved AROM on LLE today as compared to previous visits. Patient also able to participate more with transfers and bed mobility. He does fatigue quickly with advanced exercise. He would benefit from additional skilled PT intervention to improve strength, balance and mobility;    Comorbidities  Relevant past medical history and comorbidities include long term steroid use for lupus, CKD, chronic osteomyelitis, cervical pine stenosis, BPH, APS, alcohol abuse, peripheral artery disease, depression, GERD, obstructive sleep apnea, HTN, IBS, IgA deficiency, lumbar radiculopathy, RA, Stroke, toxic maculopathy in both eyes, systemic lupus erythematosus related syndrome, cardiac catheterization, carotid endarterectomy, cervical laminectomy, coronary angioplasty, Fusion C5-C7, knee arthroplasty, lumbar surgery.     Examination-Activity Limitations  Bed  Mobility;Bend;Sit;Toileting;Stand;Stairs;Lift;Transfers;Squat;Locomotion Level;Carry;Dressing;Hygiene/Grooming;Continence    Examination-Participation Restrictions  Yard Work;Interpersonal Relationship;Community Activity    Rehab Potential  Fair    PT Frequency  3x / week    PT Duration  12 weeks    PT Treatment/Interventions  ADLs/Self Care Home Management;Electrical Stimulation;Moist Heat;Cryotherapy;Gait training;Stair training;Functional mobility training;Therapeutic exercise;Balance training;Neuromuscular re-education;Cognitive remediation;Patient/family education;Orthotic Fit/Training;Wheelchair mobility training;Manual techniques;Passive range of motion;Energy conservation;Joint Manipulations    PT Next Visit Plan  work on functional strength and establish HEP    PT Home Exercise Plan  will establish next session    Consulted and Agree with Plan of Care  Patient;Family member/caregiver    Family Member Consulted  daugher [name]       Patient will benefit from skilled therapeutic intervention in order to improve the following deficits and impairments:  Abnormal gait, Decreased activity tolerance, Decreased cognition, Decreased endurance, Decreased knowledge of use of DME, Decreased range of motion, Decreased skin integrity, Decreased strength, Impaired  perceived functional ability, Impaired sensation, Impaired UE functional use, Improper body mechanics, Pain, Cardiopulmonary status limiting activity, Decreased balance, Decreased coordination, Decreased mobility, Difficulty walking, Impaired tone, Postural dysfunction  Visit Diagnosis: 1. Muscle weakness (generalized)   2. Other lack of coordination   3. Difficulty in walking, not elsewhere classified        Problem List There are no active problems to display for this patient.   Trotter,Margaret PT, DPT 07/24/2018, 1:21 PM  [place] MAIN [facility] SERVICES [address] [place], [place],  [address] Phone: [phone]   Fax:  [phone]  Name: Kenneth Summers MRN: [id number] Date of Birth: [birth year]

## 2018-07-25 ENCOUNTER — Ambulatory Visit: Payer: Medicare Other | Admitting: Occupational Therapy

## 2018-07-25 ENCOUNTER — Encounter: Payer: Self-pay | Admitting: Occupational Therapy

## 2018-07-25 ENCOUNTER — Ambulatory Visit: Payer: Medicare Other | Admitting: Physical Therapy

## 2018-07-25 ENCOUNTER — Encounter: Payer: Self-pay | Admitting: Physical Therapy

## 2018-07-25 ENCOUNTER — Other Ambulatory Visit: Payer: Self-pay

## 2018-07-25 DIAGNOSIS — M6281 Muscle weakness (generalized): Secondary | ICD-10-CM

## 2018-07-25 DIAGNOSIS — R262 Difficulty in walking, not elsewhere classified: Secondary | ICD-10-CM

## 2018-07-25 DIAGNOSIS — R278 Other lack of coordination: Secondary | ICD-10-CM

## 2018-07-25 NOTE — Therapy (Signed)
[place] MAIN [facility] SERVICES [address] [place], [place], [address] Phone: [phone]   Fax:  [phone]  Physical Therapy Treatment  Patient Details  Name: Kenneth Summers MRN: [id number] Date of Birth: 1945/09/17 Referring Provider (PT): Dr. [name], Guy Begin   Encounter Date: 07/25/2018  PT End of Session - 07/25/18 1439    Visit Number  18    Number of Visits  24    Date for PT Re-Evaluation  09/11/18    Authorization Type  Medicare reporting period from 06/12/2018    Authorization Time Period  Current cert period 9/62/2297 - 09/11/2018    Authorization - Visit Number  4    Authorization - Number of Visits  10    PT Start Time  9892    PT Stop Time  1515    PT Time Calculation (min)  44 min    Equipment Utilized During Treatment  Gait belt;Other (comment)   cloth gait belt   Activity Tolerance  Patient tolerated treatment well;Patient limited by fatigue    Behavior During Therapy  [facility] for tasks assessed/performed       Past Medical History:  Diagnosis Date  . Alcohol abuse   . APS (antiphospholipid syndrome) ([place])   . BPH (benign prostatic hyperplasia)   . CAD (coronary artery disease)   . Cellulitis   . Cervical spinal stenosis   . Chronic osteomyelitis ([place])   . CKD (chronic kidney disease)   . CKD (chronic kidney disease)   . Clostridium difficile diarrhea   . Depression   . Diplopia   . Fatigue   . GERD (gastroesophageal reflux disease)   . Hyperlipemia   . Hypertension   . Hypovitaminosis D   . IBS (irritable bowel syndrome)   . 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 Insomnia   . Left lumbosacral radiculopathy   . Moderate obstructive sleep apnea   . Osteomyelitis of foot ([place])   . PAD (peripheral artery disease) ([place])   . Pruritus   . RA (rheumatoid arthritis) ([place])   . Radiculopathy   . Restless legs syndrome   . RLS (restless legs syndrome)   . SLE (systemic lupus erythematosus related syndrome) ([place])   .  Stroke ([place])   . Toxic maculopathy of both eyes     Past Surgical History:  Procedure Laterality Date  . CARDIAC CATHETERIZATION    . CAROTID ENDARTERECTOMY    . CERVICAL LAMINECTOMY    . CORONARY ANGIOPLASTY    . FRACTURE SURGERY    . fusion C5-6-7    . HERNIA REPAIR    . KNEE ARTHROSCOPY    . TONSILLECTOMY      There were no vitals filed for this visit.  Subjective Assessment - 07/25/18 1438    Subjective  Patient reports doing well; He denies any fatigue;    Patient is accompained by:  Family member    Pertinent History  Patient is a 73 y.o. male who presents to outpatient physical therapy with a referral for medical diagnosis of CVA. This patient's chief complaints consist of left hemiplegia and overall deconditioning leading to the following functional deficits: dependent for ADLs, IADLs, unable to transfer to car, dependent transfers at home with hoyer lift, unable to walk or stand without significant assistance, difficulty with W/C navigation..    Limitations  Sitting;Lifting;Standing;House hold activities;Writing;Walking;Other (comment)    Diagnostic tests  Brain MRI 11/01/2017: IMPRESSION: 1. Acute right ACA territory infarct as demonstrated on prior CT  imaging. No intracranial hemorrhage or significant mass effect. 2. 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    Patient Stated Goals  wants to be able to walk again    Currently in Pain?  No/denies    Pain Onset  More than a month ago    Multiple Pain Sites  No    Pain Onset  Yesterday            TREATMENT: Therapeutic activity: Patient transferred squad pivot wheelchair to mat table, mod A +2 with mod VCs for trunk control, hand placement and positioning; Provided patient with increased time to scoot forward to edge of chair and to shift hips to better position prior to transfer. He required mod A for positioning feet for better position;   Patient transferred squad pivot transfer  mat table to wheelchair with mod A +2 with mod VCs for trunk position and hand placement; He requires therapist assist to block knee for better positioning;   Transferred squat pivot transfer wheelchair<>nustep x2 reps with mod VCs for hand placement and positioning, mod A +2 for safety;  Instructed patient in bed mobility: Mod A for sit to sidelying with therapist cues to cross right foot under left foot and to use right foot to assist lifting left leg onto mat table; Patient then rolled hooklying, min A +1  Rolling to right side, supervision with increased time required and with mod VCs for UE positioning and LE positioning and to use mat table to pull self to right side. Patient requires significant increased time with cues for sequencing and coordination;   Transitioned sidelying to sitting with min A +1-2 with cues for LE positioning and UE positioning; initially required +2 assist to lift trunk off mat table, but progressed to +1 assist with increased time required to complete task. Pt slow with bed mobility requiring increased time to initiate movement due to decreased motor planning;  Exercise: Instructed patient in LE strengthening and flexibility exercise: Patient hooklying: -bridges with arm by side x15 reps; pt able to hold legs into neutral position; instructed patient in posterior pelvic rock prior to hip extension for better gluteal activation; -SLR hip flexion x10 reps with AAROM on LLE; able to initiate ROM on LLE but requires AAROM for lifting LLE due to weakness;  Nustep: BLE and BUE level 2 x5 min with cues to keep steps per minute >50 with left leg attachment; Patient required mod VCs for erect posture and to avoid trunk lean to left side;  BLE only x10 steps requiring min VCs for sequencing and positioning; Patient able to activate LLE muscle activation with reciprocal motion requiring increased time for activity;   Response to treatment: Patient tolerated  session fair; He was more fatigued today requiring increased time for motor planning and muscle activation. Patient had increased difficulty with initial transfers being able to rock into stand but having difficulty with pivot transfer to chair. Patient instructed in LE exercise on mat table for better motor activation and exercise. Following exercise he was able to transfer better with improved motor activation. Recommend patient begin session with mat exercise for increased flexibility and strength training prior to mobility activities for better participation in session;                                    PT Education - 07/25/18 1439    Education Details  transfers/strengthening;  Person(s) Educated  Patient    Methods  Explanation    Comprehension  Verbalized understanding       PT Short Term Goals - 07/08/18 1405      PT SHORT TERM GOAL #1   Title  Be independent with initial home exercise program for self-management of symptoms.    Baseline  HEP to be given at second session; advice to practice unsupported sitting at home (06/19/2018); 07/08/18: Performing at home    Time  2    Period  Weeks    Status  On-going    Target Date  06/26/18        PT Long Term Goals - 07/08/18 1405      PT LONG TERM GOAL #1   Title  Patient will complete all bed mobility with min A to improve functional independence for getting in and out of bed and adjusting in bed.     Baseline  max A - total A reported by family (06/19/2018); 07/08/18: still requires maxA, dtr reports improvement in rolling since starting therapy    Time  12    Period  Weeks    Status  On-going    Target Date  09/11/18      PT LONG TERM GOAL #2   Title  Patient will complete sit <> stand transfer chair to chair with Min A and LRAD to improve functional independence for household and community mobility.     Baseline  max A +2 STS at W/C (06/12/2018); max A +2-3 STS or stand pivot transfer, able to  complete more reps (06/19/2018); 07/08/18: not safe to attempt at this time    Time  12    Period  Weeks    Status  On-going    Target Date  09/11/18      PT LONG TERM GOAL #3   Title  Patient will navigate manual w/c with min A x 100 feet to improve mobility for household and short community distances.     Baseline  required total A (06/12/2018); able to roll 20 feet with min A (06/20/2018); 07/08/18: Pt can go approximately 10' without assist per family;    Time  12    Period  Weeks    Status  On-going    Target Date  09/11/18      PT LONG TERM GOAL #4   Title  Patient will complete car transfers W/C to family SUV with min A using LRAD to improve his abilty to participate in community activities.     Baseline  unable to complete car transfers at all (06/12/2018); unable to complete car transfers at all (06/19/2018).; 07/08/18: Unable to perform at this time    Time  12    Period  Weeks    Status  On-going    Target Date  09/11/18      PT LONG TERM GOAL #5   Title  Patient will ambulate at least 150 feet with mod I using LRAD to improve mobility for household and community distances.     Baseline  unable to take steps (06/12/2018); able to shift R leg forward and back with max A and RW at edge of plinth (06/19/2018); 07/08/18: unable to ambulate at this time.     Time  12    Period  Weeks    Status  On-going    Target Date  09/11/18      PT LONG TERM GOAL #6   Title  Patient will navigate 4  steps with min A and BUE support to improve his ability to access community and social participation.     Baseline  unable to complet stairs (06/12/2018); unable to complete stairs (06/19/2018); 07/08/18: unable to attempt at this time    Time  12    Period  Weeks    Status  On-going    Target Date  09/11/18            Plan - 07/25/18 1559    Clinical Impression Statement  Patient motivated and willing to participate in therapy session; He seemed to have increased difficulty with transfers and overall  mobility during session. Patient instructed in supine exercise to increase LE muscle activation. Patient required increased time for motor planning and muscle activation. He was overall fatigued today. Patient denies any pain at end of session. He was able to participate in transfer better at end of session. Patient would benefit from additional skilled PT intervention to improve strength and mobility; Consider beginning session with mat exercise prior to transfers and mobility for better motor planning and muscle activation;    Comorbidities  Relevant past medical history and comorbidities include long term steroid use for lupus, CKD, chronic osteomyelitis, cervical pine stenosis, BPH, APS, alcohol abuse, peripheral artery disease, depression, GERD, obstructive sleep apnea, HTN, IBS, IgA deficiency, lumbar radiculopathy, RA, Stroke, toxic maculopathy in both eyes, systemic lupus erythematosus related syndrome, cardiac catheterization, carotid endarterectomy, cervical laminectomy, coronary angioplasty, Fusion C5-C7, knee arthroplasty, lumbar surgery.     Examination-Activity Limitations  Bed Mobility;Bend;Sit;Toileting;Stand;Stairs;Lift;Transfers;Squat;Locomotion Level;Carry;Dressing;Hygiene/Grooming;Continence    Examination-Participation Restrictions  Yard Work;Interpersonal Relationship;Community Activity    Rehab Potential  Fair    PT Frequency  3x / week    PT Duration  12 weeks    PT Treatment/Interventions  ADLs/Self Care Home Management;Electrical Stimulation;Moist Heat;Cryotherapy;Gait training;Stair training;Functional mobility training;Therapeutic exercise;Balance training;Neuromuscular re-education;Cognitive remediation;Patient/family education;Orthotic Fit/Training;Wheelchair mobility training;Manual techniques;Passive range of motion;Energy conservation;Joint Manipulations    PT Next Visit Plan  work on functional strength and establish HEP    PT Home Exercise Plan  will establish next session     Consulted and Agree with Plan of Care  Patient;Family member/caregiver    Family Member Consulted  daugher [name]       Patient will benefit from skilled therapeutic intervention in order to improve the following deficits and impairments:  Abnormal gait, Decreased activity tolerance, Decreased cognition, Decreased endurance, Decreased knowledge of use of DME, Decreased range of motion, Decreased skin integrity, Decreased strength, Impaired perceived functional ability, Impaired sensation, Impaired UE functional use, Improper body mechanics, Pain, Cardiopulmonary status limiting activity, Decreased balance, Decreased coordination, Decreased mobility, Difficulty walking, Impaired tone, Postural dysfunction  Visit Diagnosis: 1. Muscle weakness (generalized)   2. Other lack of coordination   3. Difficulty in walking, not elsewhere classified        Problem List There are no active problems to display for this patient.   Natanael Saladin PT, DPT 07/25/2018, 4:06 PM  [place] MAIN [facility] SERVICES [address] [place], [place], [address] Phone: [phone]   Fax:  [phone]  Name: JARIAH JARMON MRN: [id number] Date of Birth: 07/20/1945

## 2018-07-25 NOTE — Therapy (Addendum)
[place] MAIN [facility] SERVICES [address] [place], [place], [address] Phone: [phone]   Fax:  [phone]  Occupational Therapy Treatment  Patient Details  Name: Kenneth Summers MRN: [id number] Date of Birth: [birth year] No data recorded  Encounter Date: 07/25/2018  OT End of Session - 07/25/18 1645    Visit Number  16    Number of Visits  24    Date for OT Re-Evaluation  09/04/18    OT Start Time  1330    OT Stop Time  1415    OT Time Calculation (min)  45 min    Activity Tolerance  Patient tolerated treatment well    Behavior During Therapy  [facility] for tasks assessed/performed       Past Medical History:  Diagnosis Date  . Alcohol abuse   . APS (antiphospholipid syndrome) ([place])   . BPH (benign prostatic hyperplasia)   . CAD (coronary artery disease)   . Cellulitis   . Cervical spinal stenosis   . Chronic osteomyelitis ([place])   . CKD (chronic kidney disease)   . CKD (chronic kidney disease)   . Clostridium difficile diarrhea   . Depression   . Diplopia   . Fatigue   . GERD (gastroesophageal reflux disease)   . Hyperlipemia   . Hypertension   . Hypovitaminosis D   . IBS (irritable bowel syndrome)   . IgA deficiency ([place])   . Insomnia   . Left lumbosacral radiculopathy   . Moderate obstructive sleep apnea   . Osteomyelitis of foot ([place])   . PAD (peripheral artery disease) ([place])   . Pruritus   . RA (rheumatoid arthritis) ([place])   . Radiculopathy   . Restless legs syndrome   . RLS (restless legs syndrome)   . SLE (systemic lupus erythematosus related syndrome) ([place])   . Stroke ([place])   . Toxic maculopathy of both eyes     Past Surgical History:  Procedure Laterality Date  . CARDIAC CATHETERIZATION    . CAROTID ENDARTERECTOMY    . CERVICAL LAMINECTOMY    . CORONARY ANGIOPLASTY    . FRACTURE SURGERY    . fusion C5-6-7    . HERNIA REPAIR    . KNEE ARTHROSCOPY    . TONSILLECTOMY      There were no vitals filed  for this visit.  Subjective Assessment - 07/25/18 1643    Subjective   Pt. reports that he is looking forward to having ribs on Father's Day.    Pertinent History  Pt. is a 73 y.o. 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 Pt. PMHx includes: Multiple Falls, Lupus, DJD, Renal Abscess, CVA. Pt. resides with his wife. Pt.'s wife and daughter assist with ADLs. Pt. has caregivers in  for 2 hours a day, 6 days a week. Pt. received Rehab services in acute care, at SNF for STR, and [place] services. Pt. is retired from [employer] for [employer] and [employer].    Currently in Pain?  No/denies      OT TREATMENT    Neuro muscular re-education:  Pt. worked trunk rotation, and elongation skills stretching to reach across midline with the left hand grasping, and manipulating yellow, red, and green resistive clips placed to the far right. Pt. worked on placing the clips onto a ruler placed to the far left. Pt. worked on placing the clips at various horizontal angles. Tactile and verbal cues were required for eliciting the  desired movement. Pt. was able to initiate trunk rotation when reaching across midline. Pt. Worked on grasping flat coins, and placing them through a resistive container with the left hand.  Therapeutic Exercise:  Pt. performed 2# dowel ex. For UE strengthening secondary to weakness. Bilateral shoulder flexion, chest press, and circular patterns. 2# dumbbell ex. for elbow flexion and extension, forearm supination/pronation, wrist flexion/extension, and radial deviation. Pt. requires rest breaks and verbal cues for proper technique.  Selfcare:  Pt. worked on UE dressing using hemi dressing techniques. Pt. requires verbal cues, tactile cues, and visual demonstration, for donning the shirt in the correct direction, as pt. initially donned the jacket backwards. Pt. Initiated engaging his left hand to stabilize the zipper on the jacket. Pt. Required cues to  attempt using the zipper with the right hand.                        OT Education - 07/25/18 1644    Education Details  left sided awareness, attention, trunk rotation.    Person(s) Educated  Patient    Methods  Explanation;Demonstration    Comprehension  Verbalized understanding;Returned demonstration          OT Long Term Goals - 07/10/18 1638      OT LONG TERM GOAL #1   Title  Pt. will increase left shoulder flexion AROM by 10 degrees to access cabinet/shelf.    Baseline  07/10/2018: AROM left shoulder flexion: 104    Time  12    Period  Weeks    Status  On-going    Target Date  09/04/18      OT LONG TERM GOAL #2   Title  Pt. will donn a shirt with Supervision.    Baseline  07/10/2018: Min-ModA    Time  12    Period  Weeks    Status  New    Target Date  09/04/18      OT LONG TERM GOAL #3   Title  Pt. will require ModA to perform LE dressing    Baseline  Eval: MaxA left, ModA with the right    Time  12    Period  Weeks    Status  On-going    Target Date  09/04/18      OT LONG TERM GOAL #4   Title  Pt. will improve left grip strength by 10# to be able to open a jar/container.    Baseline  07/10/2018: Left: 29, Right 40    Time  12    Period  Weeks    Status  On-going    Target Date  09/04/18      OT LONG TERM GOAL #5   Title  Pt. will improve left hand [facility] skills to be able to assit with buttoning/zipping.    Baseline  Eval: Left: 1 min & 52 sec. R: 56 sec.    Time  12    Period  Weeks    Status  New    Target Date  09/04/18      OT LONG TERM GOAL #6   Title  Pt. will demonstrate visual conmpensatory strategies during 100% of the time during self care.    Baseline  Continue    Time  12    Period  Weeks    Status  On-going    Target Date  09/04/18      OT LONG TERM GOAL #7   Title  Pt. will prepare  a simple cold snack from w/c with supervision using cognitive compensatory strategies 100% of the time.    Baseline  Pt. is unable     Time  12    Period  Weeks    Status  On-going    Target Date  09/04/18      OT LONG TERM GOAL #8   Title  Pt. will accurately identify potential safety hazard using good safety awareness, and judgement 100% for ADLs, and IADLs.    Baseline  Continue    Time  12    Period  Weeks    Status  On-going    Target Date  09/04/18            Plan - 07/25/18 1646    Clinical Impression Statement  Pt. is making progress with left sided awareness, attention, trunk rotation, and engaging the left hand during functional tasks. Pt. continues to work on improving functional performance with ADLS, and IADL tasks in order to increase overall independence, and decrease caregiver burden.    Occupational performance deficits (Please refer to evaluation for details):  ADL's    Body Structure / Function / Physical Skills  UE functional use;Coordination;FMC;Dexterity;Strength;ROM    Cognitive Skills  Attention;Memory;Emotional;Problem Solve;Safety Awareness    Rehab Potential  Good    Clinical Decision Making  Multiple treatment options, significant modification of task necessary    OT Frequency  3x / week    OT Duration  12 weeks    OT Treatment/Interventions  Self-care/ADL training;DME and/or AE instruction;Therapeutic exercise;Therapeutic activities;Moist Heat;Cognitive remediation/compensation;Neuromuscular education;Visual/perceptual remediation/compensation;Coping strategies training;Patient/family education;Passive range of motion;Psychosocial skills training;Energy conservation;Functional Mobility Training       Patient will benefit from skilled therapeutic intervention in order to improve the following deficits and impairments:   Body Structure / Function / Physical Skills: UE functional use, Coordination, FMC, Dexterity, Strength, ROM Cognitive Skills: Attention, Memory, Emotional, Problem Solve, Safety Awareness     Visit Diagnosis: 1. Muscle weakness (generalized)   2. Other lack of  coordination       Problem List There are no active problems to display for this patient.   Harrel Carina, MS, OTR/L 07/25/2018, 4:55 PM  [place] MAIN [facility] SERVICES [address] [place], [place], [address] Phone: [phone]   Fax:  [phone]  Name: IZAIAH TABB MRN: [id number] Date of Birth: August 23, 1945

## 2018-07-29 ENCOUNTER — Ambulatory Visit: Payer: Medicare Other | Admitting: Occupational Therapy

## 2018-07-29 ENCOUNTER — Ambulatory Visit: Payer: Medicare Other

## 2018-07-29 ENCOUNTER — Other Ambulatory Visit: Payer: Self-pay

## 2018-07-29 ENCOUNTER — Encounter: Payer: Self-pay | Admitting: Occupational Therapy

## 2018-07-29 DIAGNOSIS — I69354 Hemiplegia and hemiparesis following cerebral infarction affecting left non-dominant side: Secondary | ICD-10-CM

## 2018-07-29 DIAGNOSIS — M6281 Muscle weakness (generalized): Secondary | ICD-10-CM

## 2018-07-29 DIAGNOSIS — R262 Difficulty in walking, not elsewhere classified: Secondary | ICD-10-CM

## 2018-07-29 DIAGNOSIS — R278 Other lack of coordination: Secondary | ICD-10-CM

## 2018-07-29 NOTE — Therapy (Signed)
[place] MAIN [facility] SERVICES [address] [place], [place], [address] Phone: [phone]   Fax:  [phone]  Occupational Therapy Treatment  Patient Details  Name: Kenneth Summers MRN: [id number] Date of Birth: 02/13/1945 No data recorded  Encounter Date: 07/29/2018  OT End of Session - 07/29/18 1718    Visit Number  17    Number of Visits  24    Date for OT Re-Evaluation  09/04/18    OT Start Time  1420    OT Stop Time  1505    OT Time Calculation (min)  45 min    Activity Tolerance  Patient tolerated treatment well    Behavior During Therapy  [facility] for tasks assessed/performed       Past Medical History:  Diagnosis Date  . Alcohol abuse   . APS (antiphospholipid syndrome) ([place])   . BPH (benign prostatic hyperplasia)   . CAD (coronary artery disease)   . Cellulitis   . Cervical spinal stenosis   . Chronic osteomyelitis ([place])   . CKD (chronic kidney disease)   . CKD (chronic kidney disease)   . Clostridium difficile diarrhea   . Depression   . Diplopia   . Fatigue   . GERD (gastroesophageal reflux disease)   . Hyperlipemia   . Hypertension   . Hypovitaminosis D   . IBS (irritable bowel syndrome)   . 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 Insomnia   . Left lumbosacral radiculopathy   . Moderate obstructive sleep apnea   . Osteomyelitis of foot ([place])   . PAD (peripheral artery disease) ([place])   . Pruritus   . RA (rheumatoid arthritis) ([place])   . Radiculopathy   . Restless legs syndrome   . RLS (restless legs syndrome)   . SLE (systemic lupus erythematosus related syndrome) ([place])   . Stroke ([place])   . Toxic maculopathy of both eyes     Past Surgical History:  Procedure Laterality Date  . CARDIAC CATHETERIZATION    . CAROTID ENDARTERECTOMY    . CERVICAL LAMINECTOMY    . CORONARY ANGIOPLASTY    . FRACTURE SURGERY    . fusion C5-6-7    . HERNIA REPAIR    . KNEE ARTHROSCOPY    . TONSILLECTOMY      There were no vitals filed  for this visit.  Subjective Assessment - 07/29/18 1717    Subjective   Pt. reports having had a nice Father's Day.    Pertinent History  Pt. is a 73 y.o. 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 Pt. PMHx includes: Multiple Falls, Lupus, DJD, Renal Abscess, CVA. Pt. resides with his wife. Pt.'s wife and daughter assist with ADLs. Pt. has caregivers in  for 2 hours a day, 6 days a week. Pt. received Rehab services in acute care, at SNF for STR, and [facility]. Pt. is retired from [employer] for [employer] and [employer].    Currently in Pain?  No/denies       OT TREATMENT    Therapeutic Exercise:  Pt. worked on grasping large flat shapes with his left hand, and moving them through 2 vertical dowels of progressively increasing heights. Pt. Worked on reaching to the left to retrieve the each shape. Pt. Required cues for hand placement, and position on the shape.  Selfcare:  Pt. worked on simulated medication management tasks. Pt. was able to problem-solve through setting up a pillbox following a list  of instructions with  75% accuracy. Pt. Required increased time to complete, as well as cues for redirection, and to engage his left hand during the task.                              OT Education - 07/29/18 1718    Education Details  left sided awareness, attention, trunk rotation.    Person(s) Educated  Patient    Methods  Explanation;Demonstration    Comprehension  Verbalized understanding;Returned demonstration          OT Long Term Goals - 07/10/18 1638      OT LONG TERM GOAL #1   Title  Pt. will increase left shoulder flexion AROM by 10 degrees to access cabinet/shelf.    Baseline  07/10/2018: AROM left shoulder flexion: 104    Time  12    Period  Weeks    Status  On-going    Target Date  09/04/18      OT LONG TERM GOAL #2   Title  Pt. will donn a shirt with Supervision.    Baseline  07/10/2018: Min-ModA     Time  12    Period  Weeks    Status  New    Target Date  09/04/18      OT LONG TERM GOAL #3   Title  Pt. will require ModA to perform LE dressing    Baseline  Eval: MaxA left, ModA with the right    Time  12    Period  Weeks    Status  On-going    Target Date  09/04/18      OT LONG TERM GOAL #4   Title  Pt. will improve left grip strength by 10# to be able to open a jar/container.    Baseline  07/10/2018: Left: 29, Right 40    Time  12    Period  Weeks    Status  On-going    Target Date  09/04/18      OT LONG TERM GOAL #5   Title  Pt. will improve left hand [facility] skills to be able to assit with buttoning/zipping.    Baseline  Eval: Left: 1 min & 52 sec. R: 56 sec.    Time  12    Period  Weeks    Status  New    Target Date  09/04/18      OT LONG TERM GOAL #6   Title  Pt. will demonstrate visual conmpensatory strategies during 100% of the time during self care.    Baseline  Continue    Time  12    Period  Weeks    Status  On-going    Target Date  09/04/18      OT LONG TERM GOAL #7   Title  Pt. will prepare a simple cold snack from w/c with supervision using cognitive compensatory strategies 100% of the time.    Baseline  Pt. is unable    Time  12    Period  Weeks    Status  On-going    Target Date  09/04/18      OT LONG TERM GOAL #8   Title  Pt. will accurately identify potential safety hazard using good safety awareness, and judgement 100% for ADLs, and IADLs.    Baseline  Continue    Time  12    Period  Weeks    Status  On-going  Target Date  09/04/18            Plan - 07/29/18 1719    Clinical Impression Statement  Pt. continues to improve with left sided awareness, and was able to maintain attention on the tas with minimal cues for redirection with increased environmental stimulae. Pt. continues to work on improving left sided awareness, and Left UE functioning in order to improve ADL, and IADL functioning.    Occupational performance deficits (Please  refer to evaluation for details):  ADL's    Body Structure / Function / Physical Skills  UE functional use;Coordination;FMC;Dexterity;Strength;ROM    Cognitive Skills  Attention;Memory;Emotional;Problem Solve;Safety Awareness    Rehab Potential  Good    Clinical Decision Making  Multiple treatment options, significant modification of task necessary    Comorbidities impacting occupational performance description:  Phyical, cognitive, visual,  medical comorbidities    Modification or Assistance to Complete Evaluation   Max significant modification of tasks or assist is necessary to complete    OT Frequency  3x / week    OT Duration  12 weeks    OT Treatment/Interventions  Self-care/ADL training;DME and/or AE instruction;Therapeutic exercise;Therapeutic activities;Moist Heat;Cognitive remediation/compensation;Neuromuscular education;Visual/perceptual remediation/compensation;Coping strategies training;Patient/family education;Passive range of motion;Psychosocial skills training;Energy conservation;Functional Mobility Training    Consulted and Agree with Plan of Care  Patient;Family member/caregiver    Family Member Consulted  Daughter [name]       Patient will benefit from skilled therapeutic intervention in order to improve the following deficits and impairments:   Body Structure / Function / Physical Skills: UE functional use, Coordination, FMC, Dexterity, Strength, ROM Cognitive Skills: Attention, Memory, Emotional, Problem Solve, Safety Awareness     Visit Diagnosis: 1. Muscle weakness (generalized)   2. Other lack of coordination       Problem List There are no active problems to display for this patient.   Harrel Carina, MS, OTR/L 07/29/2018, 5:28 PM  [place] MAIN [facility] SERVICES [address] [place], [place], [address] Phone: [phone]   Fax:  [phone]  Name: Kenneth Summers MRN: [id number] Date of Birth: Apr 09, 1945

## 2018-07-29 NOTE — Therapy (Signed)
[place] MAIN [facility] SERVICES [address] [place], [place], [address] Phone: [phone]   Fax:  [phone]  Physical Therapy Treatment  Patient Details  Name: Kenneth Summers MRN: [id number] Date of Birth: [birth year] Referring Provider (PT): Dr. [name], Guy Begin   Encounter Date: 07/29/2018  PT End of Session - 07/29/18 1524    Visit Number  19    Number of Visits  24    Date for PT Re-Evaluation  09/11/18    Authorization Type  Medicare reporting period from 06/12/2018    Authorization Time Period  Current cert period 8/93/8101 - 09/11/2018    Authorization - Visit Number  --    Authorization - Number of Visits  --    PT Start Time  1331    PT Stop Time  1416    PT Time Calculation (min)  45 min    Equipment Utilized During Treatment  Gait belt;Other (comment)   cloth gait belt   Activity Tolerance  Patient tolerated treatment well;Patient limited by fatigue    Behavior During Therapy  [facility] for tasks assessed/performed       Past Medical History:  Diagnosis Date  . Alcohol abuse   . APS (antiphospholipid syndrome) ([place])   . BPH (benign prostatic hyperplasia)   . CAD (coronary artery disease)   . Cellulitis   . Cervical spinal stenosis   . Chronic osteomyelitis ([place])   . CKD (chronic kidney disease)   . CKD (chronic kidney disease)   . Clostridium difficile diarrhea   . Depression   . Diplopia   . Fatigue   . GERD (gastroesophageal reflux disease)   . Hyperlipemia   . Hypertension   . Hypovitaminosis D   . IBS (irritable bowel syndrome)   . IgA deficiency ([place])   . Insomnia   . Left lumbosacral radiculopathy   . Moderate obstructive sleep apnea   . Osteomyelitis of foot ([place])   . PAD (peripheral artery disease) ([place])   . Pruritus   . RA (rheumatoid arthritis) ([place])   . Radiculopathy   . Restless legs syndrome   . RLS (restless legs syndrome)   . SLE (systemic lupus erythematosus related syndrome) ([place])   .  Stroke ([place])   . Toxic maculopathy of both eyes     Past Surgical History:  Procedure Laterality Date  . CARDIAC CATHETERIZATION    . CAROTID ENDARTERECTOMY    . CERVICAL LAMINECTOMY    . CORONARY ANGIOPLASTY    . FRACTURE SURGERY    . fusion C5-6-7    . HERNIA REPAIR    . KNEE ARTHROSCOPY    . TONSILLECTOMY      There were no vitals filed for this visit.  Subjective Assessment - 07/29/18 1331    Subjective  Patient reports doing well. He reports that he feels tired today but he denies any pain. Denies falls. No specific questions or concerns at this time.    Patient is accompained by:  Family member    Pertinent History  Patient is a 73 y.o. male who presents to outpatient physical therapy with a referral for medical diagnosis of CVA. This patient's chief complaints consist of left hemiplegia and overall deconditioning leading to the following functional deficits: dependent for ADLs, IADLs, unable to transfer to car, dependent transfers at home with hoyer lift, unable to walk or stand without significant assistance, difficulty with W/C navigation..    Limitations  Sitting;Lifting;Standing;House hold activities;Writing;Walking;Other (comment)  Diagnostic tests  Brain MRI 11/01/2017: IMPRESSION: 1. Acute right ACA territory infarct as demonstrated on prior CT imaging. No intracranial hemorrhage or significant mass effect. 2. 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    Patient Stated Goals  wants to be able to walk again    Currently in Pain?  No/denies    Pain Onset  --         TREATMENT   Therapeutic activity Patient transferred squat pivot wheelchair to mat table, mod A +2 with mod VCs for trunk control, hand placement and positioning; Provided patient with increased time to scoot forward to edge of chair and to shift hips to better position prior to transfer. He required mod A for positioning feet for better position;   At end of session  patient transferred squat pivot transfer mat table to wheelchair with mod A +2 with mod VCs for trunk position and hand placement; He requires therapist assist to block knee for better positioning;   Transitioned sidelying to sitting with min A +1-2 with cues for LE positioning and UE positioning; initially required +2 assist to lift trunk off mat table, but progressed to +1 assist with increased time required to complete task. Pt slow with bed mobility requiring increased time to initiate movement due to decreased motor planning;  Practiced sitting balance with anterior pelvic tilts, scapular retraction, and upright posture without posterior leaning. Practiced weight shifting left and right as well as forward/backward in sitting. Pt requires repeated cues for proper upright posture.  Sit to stand practice x 3 with intermittent use of rolling walker. Once upright attempted to maintain standing posture for longer periods of time as pt is able to tolerate. Pt requires repeated cues for knee and hip extension as well as upright posture once standing. He is clearly very fatigued during second and third attempt and is unable to stand as long or maintain hips and knees extended. During final standing repetition practiced right and left weight shifting while holding onto walker. Therapist occasionally blocking L knee and assisting L knee into extension.   Estim Attended Hooklying with bolster under legs: PT applied NMES to L quad muscle, large muscle group atrophy setting, 10sec on, 10 sec off x 10 min at tolerated intensity (#46-50) with mod VCs to increase quad muscle activation during NMES activation; Patient able to initiate quad activation but does require AAROM to achieve full SAQ ROM despite NMES assistance. Strength does appear to be improving from prior sessions with improved ability to initiate motion and hold foot off mat table. He denies any pain or discomfort. He becomes progressively more  distracted as session continues.   Ther-ex  Instructed patient in LE strengthening Patient hooklying: SLR hip flexion x 10 reps with AAROM on LLE; able to initiate ROM on LLE but requires AAROM for lifting LLE due to weakness; Hooklying clams with manual resistance x 10, extensive cues and redirection; Hooklying adductor squeezes with gentle manual resistance x 10; Bridges with arm by side x 10reps;   Response to treatment: Pt demonstrating improved initiation with LLE exercises today. He remains fatigued today as session progresses requiring increased time for motor planning and muscle activation. Pt struggles with motor planning for upright posture in standing and requires frequent cues from therapist. Patient instructed in LE exercise on mat table for better motor activation and exercise. Continue to recommend patient begin session with mat exercise for increased flexibility and strength training prior to mobility activities for better  participation in session;    Pt is continuing to make good progress with therapy. He continues to struggle with sitting posture and repeatedly falls backwards and to the right initially when upright. However after 1-2 minutes in sitting he is able to maintain balance with CGA only. However he is able to come fully upright to standing today multiple times with assist from therapist and rehab tech. His standing endurance is improving but it does worsen with repeated attempts.He is still unsafe to attempt any form of ambulation. Pt smiles frequently in standing and appears encouraged.Pt will benefit from PT services to address deficits in strength, balance, and mobility in order to return to full function at home.                        PT Short Term Goals - 07/08/18 1405      PT SHORT TERM GOAL #1   Title  Be independent with initial home exercise program for self-management of symptoms.    Baseline  HEP to be given at second  session; advice to practice unsupported sitting at home (06/19/2018); 07/08/18: Performing at home    Time  2    Period  Weeks    Status  On-going    Target Date  06/26/18        PT Long Term Goals - 07/08/18 1405      PT LONG TERM GOAL #1   Title  Patient will complete all bed mobility with min A to improve functional independence for getting in and out of bed and adjusting in bed.     Baseline  max A - total A reported by family (06/19/2018); 07/08/18: still requires maxA, dtr reports improvement in rolling since starting therapy    Time  12    Period  Weeks    Status  On-going    Target Date  09/11/18      PT LONG TERM GOAL #2   Title  Patient will complete sit <> stand transfer chair to chair with Min A and LRAD to improve functional independence for household and community mobility.     Baseline  max A +2 STS at W/C (06/12/2018); max A +2-3 STS or stand pivot transfer, able to complete more reps (06/19/2018); 07/08/18: not safe to attempt at this time    Time  12    Period  Weeks    Status  On-going    Target Date  09/11/18      PT LONG TERM GOAL #3   Title  Patient will navigate manual w/c with min A x 100 feet to improve mobility for household and short community distances.     Baseline  required total A (06/12/2018); able to roll 20 feet with min A (06/20/2018); 07/08/18: Pt can go approximately 10' without assist per family;    Time  12    Period  Weeks    Status  On-going    Target Date  09/11/18      PT LONG TERM GOAL #4   Title  Patient will complete car transfers W/C to family SUV with min A using LRAD to improve his abilty to participate in community activities.     Baseline  unable to complete car transfers at all (06/12/2018); unable to complete car transfers at all (06/19/2018).; 07/08/18: Unable to perform at this time    Time  12    Period  Weeks    Status  On-going  Target Date  09/11/18      PT LONG TERM GOAL #5   Title  Patient will ambulate at least 150 feet with mod I  using LRAD to improve mobility for household and community distances.     Baseline  unable to take steps (06/12/2018); able to shift R leg forward and back with max A and RW at edge of plinth (06/19/2018); 07/08/18: unable to ambulate at this time.     Time  12    Period  Weeks    Status  On-going    Target Date  09/11/18      PT LONG TERM GOAL #6   Title  Patient will navigate 4 steps with min A and BUE support to improve his ability to access community and social participation.     Baseline  unable to complet stairs (06/12/2018); unable to complete stairs (06/19/2018); 07/08/18: unable to attempt at this time    Time  12    Period  Weeks    Status  On-going    Target Date  09/11/18            Plan - 07/29/18 1525    Clinical Impression Statement  Pt is continuing to make good progress with therapy. He continues to struggle with sitting posture and repeatedly falls backwards and to the right initially when upright. However after 1-2 minutes in sitting he is able to maintain balance with CGA only. However he is able to come fully upright to standing today multiple times with assist from therapist and rehab tech. His standing endurance is improving but it does worsen with repeated attempts. He is still unsafe to attempt any form of ambulation. Pt smiles frequently in standing and appears encouraged. Pt will benefit from PT services to address deficits in strength, balance, and mobility in order to return to full function at home.    Comorbidities  Relevant past medical history and comorbidities include long term steroid use for lupus, CKD, chronic osteomyelitis, cervical pine stenosis, BPH, APS, alcohol abuse, peripheral artery disease, depression, GERD, obstructive sleep apnea, HTN, IBS, IgA deficiency, lumbar radiculopathy, RA, Stroke, toxic maculopathy in both eyes, systemic lupus erythematosus related syndrome, cardiac catheterization, carotid endarterectomy, cervical laminectomy, coronary  angioplasty, Fusion C5-C7, knee arthroplasty, lumbar surgery.     Examination-Activity Limitations  Bed Mobility;Bend;Sit;Toileting;Stand;Stairs;Lift;Transfers;Squat;Locomotion Level;Carry;Dressing;Hygiene/Grooming;Continence    Examination-Participation Restrictions  Yard Work;Interpersonal Relationship;Community Activity    Rehab Potential  Fair    PT Frequency  3x / week    PT Duration  12 weeks    PT Treatment/Interventions  ADLs/Self Care Home Management;Electrical Stimulation;Moist Heat;Cryotherapy;Gait training;Stair training;Functional mobility training;Therapeutic exercise;Balance training;Neuromuscular re-education;Cognitive remediation;Patient/family education;Orthotic Fit/Training;Wheelchair mobility training;Manual techniques;Passive range of motion;Energy conservation;Joint Manipulations    PT Next Visit Plan  Progress note and any outcome measures as needed, work on functional strength and establish HEP    PT Home Exercise Plan  will establish next session    Consulted and Agree with Plan of Care  Patient;Family member/caregiver    Family Member Consulted  daugher [name]       Patient will benefit from skilled therapeutic intervention in order to improve the following deficits and impairments:  Abnormal gait, Decreased activity tolerance, Decreased cognition, Decreased endurance, Decreased knowledge of use of DME, Decreased range of motion, Decreased skin integrity, Decreased strength, Impaired perceived functional ability, Impaired sensation, Impaired UE functional use, Improper body mechanics, Pain, Cardiopulmonary status limiting activity, Decreased balance, Decreased coordination, Decreased mobility, Difficulty walking, Impaired tone, Postural dysfunction  Visit  Diagnosis: 1. Muscle weakness (generalized)   2. Difficulty in walking, not elsewhere classified   3. Hemiplegia and hemiparesis following cerebral infarction affecting left non-dominant side ([place])        Problem  List There are no active problems to display for this patient.  Phillips Grout PT, DPT, GCS  Lanea Vankirk 07/30/2018, 8:19 AM  [place] MAIN [facility] SERVICES [address] [place], [place], [address] Phone: [phone]   Fax:  [phone]  Name: CHRISTPOHER SIEVERS MRN: [id number] Date of Birth: 1945-11-12

## 2018-07-30 ENCOUNTER — Encounter: Payer: Self-pay | Admitting: Occupational Therapy

## 2018-07-30 ENCOUNTER — Other Ambulatory Visit: Payer: Self-pay

## 2018-07-30 ENCOUNTER — Ambulatory Visit: Payer: Medicare Other | Admitting: Occupational Therapy

## 2018-07-30 ENCOUNTER — Ambulatory Visit: Payer: Medicare Other | Admitting: Physical Therapy

## 2018-07-30 ENCOUNTER — Encounter: Payer: Self-pay | Admitting: Physical Therapy

## 2018-07-30 DIAGNOSIS — R278 Other lack of coordination: Secondary | ICD-10-CM

## 2018-07-30 DIAGNOSIS — M6281 Muscle weakness (generalized): Secondary | ICD-10-CM

## 2018-07-30 DIAGNOSIS — R262 Difficulty in walking, not elsewhere classified: Secondary | ICD-10-CM

## 2018-07-30 DIAGNOSIS — I69354 Hemiplegia and hemiparesis following cerebral infarction affecting left non-dominant side: Secondary | ICD-10-CM

## 2018-07-30 NOTE — Therapy (Signed)
[place] MAIN [facility] SERVICES [address] [place], [place], [address] Phone: [phone]   Fax:  [phone]  Occupational Therapy Treatment  Patient Details  Name: Kenneth Summers MRN: [id number] Date of Birth: [birth year] No data recorded  Encounter Date: 07/30/2018  OT End of Session - 07/30/18 1719    Visit Number  18    Number of Visits  24    Date for OT Re-Evaluation  09/04/18    Authorization Type  Progress report period starting 06/12/2018    OT Start Time  1330    OT Stop Time  1415    OT Time Calculation (min)  45 min    Activity Tolerance  Patient tolerated treatment well    Behavior During Therapy  [facility] for tasks assessed/performed       Past Medical History:  Diagnosis Date  . Alcohol abuse   . APS (antiphospholipid syndrome) ([place])   . BPH (benign prostatic hyperplasia)   . CAD (coronary artery disease)   . Cellulitis   . Cervical spinal stenosis   . Chronic osteomyelitis ([place])   . CKD (chronic kidney disease)   . CKD (chronic kidney disease)   . Clostridium difficile diarrhea   . Depression   . Diplopia   . Fatigue   . GERD (gastroesophageal reflux disease)   . Hyperlipemia   . Hypertension   . Hypovitaminosis D   . IBS (irritable bowel syndrome)   . 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 Insomnia   . Left lumbosacral radiculopathy   . Moderate obstructive sleep apnea   . Osteomyelitis of foot ([place])   . PAD (peripheral artery disease) ([place])   . Pruritus   . RA (rheumatoid arthritis) ([place])   . Radiculopathy   . Restless legs syndrome   . RLS (restless legs syndrome)   . SLE (systemic lupus erythematosus related syndrome) ([place])   . Stroke ([place])   . Toxic maculopathy of both eyes     Past Surgical History:  Procedure Laterality Date  . CARDIAC CATHETERIZATION    . CAROTID ENDARTERECTOMY    . CERVICAL LAMINECTOMY    . CORONARY ANGIOPLASTY    . FRACTURE SURGERY    . fusion C5-6-7    . HERNIA REPAIR    . KNEE  ARTHROSCOPY    . TONSILLECTOMY      There were no vitals filed for this visit.  Subjective Assessment - 07/30/18 1422    Subjective   Pt. reports that he is more awake today    Pertinent History  Pt. is a 73 y.o. 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 Pt. PMHx includes: Multiple Falls, Lupus, DJD, Renal Abscess, CVA. Pt. resides with his wife. Pt.'s wife and daughter assist with ADLs. Pt. has caregivers in  for 2 hours a day, 6 days a week. Pt. received Rehab services in acute care, at SNF for STR, and [place] services. Pt. is retired from [employer] for [employer] and [employer].    Currently in Pain?  No/denies      OT TREATMENT    Neuro muscular re-education:  Pt. performed [facility] skills training to improve speed and dexterity needed for ADL tasks and writing. Pt. demonstrated grasping 1 inch sticks,  inch cylindrical collars, and  inch flat washers on the [place] pegboard. Attention to task was improved today. Pt. Required minimal cuing. Pt. Worked on right, and left bilateral alternating hand movements.  Therapeutic Exercise:  Pt. performed 2# dowel ex. For UE strengthening secondary to weakness. Bilateral shoulder flexion, chest press, circular patterns, and elbow flexion/extension were performed. Pt. worked on using the dowel with his bilateral UEs to reach for targets at various planes with forward flexion at the trunk. Pt. Required verbal cues.                              OT Long Term Goals - 07/10/18 1638      OT LONG TERM GOAL #1   Title  Pt. will increase left shoulder flexion AROM by 10 degrees to access cabinet/shelf.    Baseline  07/10/2018: AROM left shoulder flexion: 104    Time  12    Period  Weeks    Status  On-going    Target Date  09/04/18      OT LONG TERM GOAL #2   Title  Pt. will donn a shirt with Supervision.    Baseline  07/10/2018: Min-ModA    Time  12    Period  Weeks    Status  New     Target Date  09/04/18      OT LONG TERM GOAL #3   Title  Pt. will require ModA to perform LE dressing    Baseline  Eval: MaxA left, ModA with the right    Time  12    Period  Weeks    Status  On-going    Target Date  09/04/18      OT LONG TERM GOAL #4   Title  Pt. will improve left grip strength by 10# to be able to open a jar/container.    Baseline  07/10/2018: Left: 29, Right 40    Time  12    Period  Weeks    Status  On-going    Target Date  09/04/18      OT LONG TERM GOAL #5   Title  Pt. will improve left hand [facility] skills to be able to assit with buttoning/zipping.    Baseline  Eval: Left: 1 min & 52 sec. R: 56 sec.    Time  12    Period  Weeks    Status  New    Target Date  09/04/18      OT LONG TERM GOAL #6   Title  Pt. will demonstrate visual conmpensatory strategies during 100% of the time during self care.    Baseline  Continue    Time  12    Period  Weeks    Status  On-going    Target Date  09/04/18      OT LONG TERM GOAL #7   Title  Pt. will prepare a simple cold snack from w/c with supervision using cognitive compensatory strategies 100% of the time.    Baseline  Pt. is unable    Time  12    Period  Weeks    Status  On-going    Target Date  09/04/18      OT LONG TERM GOAL #8   Title  Pt. will accurately identify potential safety hazard using good safety awareness, and judgement 100% for ADLs, and IADLs.    Baseline  Continue    Time  12    Period  Weeks    Status  On-going    Target Date  09/04/18            Plan - 07/30/18 1720  Clinical Impression Statement  Pt. continues to work on improving left sided awareness, increasing attention during each task, improving LUE strength, motor control, and [facility] skills. Pt. conitnues to work on these skills in order to work towards improving LUE engagement during ADLs, and IADL tasks, and promote increased independence with basic ADLs while decreasing caregiver burden.    Occupational performance deficits  (Please refer to evaluation for details):  ADL's    Body Structure / Function / Physical Skills  UE functional use;Coordination;FMC;Dexterity;Strength;ROM    Cognitive Skills  Attention;Memory;Emotional;Problem Solve;Safety Awareness    Rehab Potential  Good    Clinical Decision Making  Multiple treatment options, significant modification of task necessary    Comorbidities Affecting Occupational Performance:  Presence of comorbidities impacting occupational performance    Modification or Assistance to Complete Evaluation   Max significant modification of tasks or assist is necessary to complete    OT Frequency  3x / week    OT Duration  12 weeks    OT Treatment/Interventions  Self-care/ADL training;DME and/or AE instruction;Therapeutic exercise;Therapeutic activities;Moist Heat;Cognitive remediation/compensation;Neuromuscular education;Visual/perceptual remediation/compensation;Coping strategies training;Patient/family education;Passive range of motion;Psychosocial skills training;Energy conservation;Functional Mobility Training    Recommended Other Services  PT    Consulted and Agree with Plan of Care  Patient;Family member/caregiver    Family Member Consulted  Daughter [name]       Patient will benefit from skilled therapeutic intervention in order to improve the following deficits and impairments:   Body Structure / Function / Physical Skills: UE functional use, Coordination, FMC, Dexterity, Strength, ROM Cognitive Skills: Attention, Memory, Emotional, Problem Solve, Safety Awareness     Visit Diagnosis: 1. Muscle weakness (generalized)   2. Other lack of coordination       Problem List There are no active problems to display for this patient.   Harrel Carina, MS, OTR/L 07/30/2018, 5:26 PM  [place] MAIN [facility] SERVICES [address] [place], [place], [address] Phone: [phone]   Fax:  [phone]  Name: Kenneth Summers MRN:  [id number] Date of Birth: 06/11/1945

## 2018-08-01 ENCOUNTER — Ambulatory Visit: Payer: Medicare Other | Admitting: Occupational Therapy

## 2018-08-01 ENCOUNTER — Encounter: Payer: Self-pay | Admitting: Occupational Therapy

## 2018-08-01 ENCOUNTER — Ambulatory Visit: Payer: Medicare Other

## 2018-08-01 ENCOUNTER — Other Ambulatory Visit: Payer: Self-pay

## 2018-08-01 DIAGNOSIS — M6281 Muscle weakness (generalized): Secondary | ICD-10-CM | POA: Diagnosis not present

## 2018-08-01 DIAGNOSIS — R278 Other lack of coordination: Secondary | ICD-10-CM

## 2018-08-01 DIAGNOSIS — R296 Repeated falls: Secondary | ICD-10-CM

## 2018-08-01 DIAGNOSIS — I69354 Hemiplegia and hemiparesis following cerebral infarction affecting left non-dominant side: Secondary | ICD-10-CM

## 2018-08-01 DIAGNOSIS — R262 Difficulty in walking, not elsewhere classified: Secondary | ICD-10-CM

## 2018-08-01 NOTE — Therapy (Signed)
[place] MAIN [facility] SERVICES [address] [place], [place], [address] Phone: [phone]   Fax:  [phone]  Occupational Therapy Treatment  Patient Details  Name: Kenneth Summers MRN: [id number] Date of Birth: 1945/08/11 No data recorded  Encounter Date: 08/01/2018  OT End of Session - 08/01/18 1521    Visit Number  19    Number of Visits  24    Date for OT Re-Evaluation  09/04/18    OT Start Time  1330    OT Stop Time  1415    OT Time Calculation (min)  45 min    Activity Tolerance  Patient tolerated treatment well    Behavior During Therapy  [facility] for tasks assessed/performed       Past Medical History:  Diagnosis Date  . Alcohol abuse   . APS (antiphospholipid syndrome) ([place])   . BPH (benign prostatic hyperplasia)   . CAD (coronary artery disease)   . Cellulitis   . Cervical spinal stenosis   . Chronic osteomyelitis ([place])   . CKD (chronic kidney disease)   . CKD (chronic kidney disease)   . Clostridium difficile diarrhea   . Depression   . Diplopia   . Fatigue   . GERD (gastroesophageal reflux disease)   . Hyperlipemia   . Hypertension   . Hypovitaminosis D   . IBS (irritable bowel syndrome)   . 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 Insomnia   . Left lumbosacral radiculopathy   . Moderate obstructive sleep apnea   . Osteomyelitis of foot ([place])   . PAD (peripheral artery disease) ([place])   . Pruritus   . RA (rheumatoid arthritis) ([place])   . Radiculopathy   . Restless legs syndrome   . RLS (restless legs syndrome)   . SLE (systemic lupus erythematosus related syndrome) ([place])   . Stroke ([place])   . Toxic maculopathy of both eyes     Past Surgical History:  Procedure Laterality Date  . CARDIAC CATHETERIZATION    . CAROTID ENDARTERECTOMY    . CERVICAL LAMINECTOMY    . CORONARY ANGIOPLASTY    . FRACTURE SURGERY    . fusion C5-6-7    . HERNIA REPAIR    . KNEE ARTHROSCOPY    . TONSILLECTOMY      There were no vitals filed  for this visit.  Subjective Assessment - 08/01/18 1516    Subjective   Pt. reports that he got a good night of sleep last night.    Pertinent History  Pt. is a 73 y.o. 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 Pt. PMHx includes: Multiple Falls, Lupus, DJD, Renal Abscess, CVA. Pt. resides with his wife. Pt.'s wife and daughter assist with ADLs. Pt. has caregivers in  for 2 hours a day, 6 days a week. Pt. received Rehab services in acute care, at SNF for STR, and [place] services. Pt. is retired from [employer] for [employer] and [employer].    Patient Stated Goals  To regain use of his left UE, and do more for himself.    Currently in Pain?  No/denies    Pain Onset  More than a month ago    Pain Frequency  Intermittent      OT TREATMENT    Neuro muscular re-education:   Pt. worked on grasping, and positioning magnetic hooks on a whiteboard positioned at a elevated vertical angle on the tabletop. Pt. worked on [facility] skills grasping  1/2" flat washers, and placing them on the hooks. The hooks were adjusted at various heights to work on sustaining shoulder flexion while manipulating the washers when placing them onto the hooks. Pt. required the hooks to be placed at the tabletop, or stabilized in his right hand while manipulating them with his right hand. Pt. Required minimal cues to use his left hand.  Therapeutic Exercise:  Pt. performed 2# dowel ex. For UE strengthening secondary to weakness. Bilateral shoulder flexion, chest press with cues to keep the dowel straight. Pt. Worked on pinch strengthening in the left hand for lateral, and 3pt. pinch using yellow, red, green, and blue resistive clips while following a pattern.Tactile and verbal cues were required for eliciting the desired movement. Pt. required cues to follow the pattern.                             OT Education - 08/01/18 1520    Education Details  Left hand [place] skills     Person(s) Educated  Patient    Methods  Explanation;Demonstration    Comprehension  Verbalized understanding;Returned demonstration          OT Long Term Goals - 07/10/18 1638      OT LONG TERM GOAL #1   Title  Pt. will increase left shoulder flexion AROM by 10 degrees to access cabinet/shelf.    Baseline  07/10/2018: AROM left shoulder flexion: 104    Time  12    Period  Weeks    Status  On-going    Target Date  09/04/18      OT LONG TERM GOAL #2   Title  Pt. will donn a shirt with Supervision.    Baseline  07/10/2018: Min-ModA    Time  12    Period  Weeks    Status  New    Target Date  09/04/18      OT LONG TERM GOAL #3   Title  Pt. will require ModA to perform LE dressing    Baseline  Eval: MaxA left, ModA with the right    Time  12    Period  Weeks    Status  On-going    Target Date  09/04/18      OT LONG TERM GOAL #4   Title  Pt. will improve left grip strength by 10# to be able to open a jar/container.    Baseline  07/10/2018: Left: 29, Right 40    Time  12    Period  Weeks    Status  On-going    Target Date  09/04/18      OT LONG TERM GOAL #5   Title  Pt. will improve left hand [facility] skills to be able to assit with buttoning/zipping.    Baseline  Eval: Left: 1 min & 52 sec. R: 56 sec.    Time  12    Period  Weeks    Status  New    Target Date  09/04/18      OT LONG TERM GOAL #6   Title  Pt. will demonstrate visual conmpensatory strategies during 100% of the time during self care.    Baseline  Continue    Time  12    Period  Weeks    Status  On-going    Target Date  09/04/18      OT LONG TERM GOAL #7   Title  Pt. will prepare a simple  cold snack from w/c with supervision using cognitive compensatory strategies 100% of the time.    Baseline  Pt. is unable    Time  12    Period  Weeks    Status  On-going    Target Date  09/04/18      OT LONG TERM GOAL #8   Title  Pt. will accurately identify potential safety hazard using good safety awareness, and  judgement 100% for ADLs, and IADLs.    Baseline  Continue    Time  12    Period  Weeks    Status  On-going    Target Date  09/04/18            Plan - 08/01/18 1523    Clinical Impression Statement  Pt. has improved with increasing left sided awareness, grip strength, pinch strength, and overall [place] skills.  Pt. continues to present with limited LUE ROM, strength, motor control, and [facility] skills. Pt continues to work on improving LUE functioning to improve engagement in [place], and IADLs, and to promote overall independence while decreasing caregiver burden.    Occupational performance deficits (Please refer to evaluation for details):  ADL's    Body Structure / Function / Physical Skills  UE functional use;Coordination;FMC;Dexterity;Strength;ROM    Cognitive Skills  Attention;Memory;Emotional;Problem Solve;Safety Awareness    Rehab Potential  Good    Clinical Decision Making  Multiple treatment options, significant modification of task necessary    Comorbidities impacting occupational performance description:  Phyical, cognitive, visual,  medical comorbidities    Modification or Assistance to Complete Evaluation   Max significant modification of tasks or assist is necessary to complete    OT Frequency  3x / week    OT Duration  12 weeks    OT Treatment/Interventions  Self-care/ADL training;DME and/or AE instruction;Therapeutic exercise;Therapeutic activities;Moist Heat;Cognitive remediation/compensation;Neuromuscular education;Visual/perceptual remediation/compensation;Coping strategies training;Patient/family education;Passive range of motion;Psychosocial skills training;Energy conservation;Functional Mobility Training    Consulted and Agree with Plan of Care  Patient;Family member/caregiver    Family Member Consulted  Daughter [name]       Patient will benefit from skilled therapeutic intervention in order to improve the following deficits and impairments:   Body Structure / Function /  Physical Skills: UE functional use, Coordination, FMC, Dexterity, Strength, ROM Cognitive Skills: Attention, Memory, Emotional, Problem Solve, Safety Awareness     Visit Diagnosis: 1. Muscle weakness (generalized)   2. Other lack of coordination       Problem List There are no active problems to display for this patient.   Harrel Carina, MS, OTR/L 08/01/2018, 4:42 PM  [place] MAIN [facility] SERVICES [address] [place], [place], [address] Phone: [phone]   Fax:  [phone]  Name: Kenneth Summers MRN: [id number] Date of Birth: 1945-03-21

## 2018-08-01 NOTE — Therapy (Signed)
[place] MAIN [facility] SERVICES [address] [place], [place], [address] Phone: [phone]   Fax:  [phone]  Physical Therapy Treatment Physical Therapy Progress Note   Dates of reporting period  07/08/18   to   08/01/18   Patient Details  Name: Kenneth Summers MRN: [id number] Date of Birth: 09/05/1945 Referring Provider (PT): Dr. [name], Guy Begin   Encounter Date: 08/01/2018  PT End of Session - 08/01/18 1541    Visit Number  21    Number of Visits  24    Date for PT Re-Evaluation  09/11/18    Authorization Type  Medicare reporting period from 06/12/2018    Authorization Time Period  Current cert period 09/15/1749 - 09/11/2018    Authorization - Visit Number  5    Authorization - Number of Visits  10    PT Start Time  1436    PT Stop Time  1520    PT Time Calculation (min)  44 min    Equipment Utilized During Treatment  Gait belt    Activity Tolerance  Patient tolerated treatment well;Patient limited by fatigue    Behavior During Therapy  [facility] for tasks assessed/performed       Past Medical History:  Diagnosis Date  . Alcohol abuse   . APS (antiphospholipid syndrome) ([place])   . BPH (benign prostatic hyperplasia)   . CAD (coronary artery disease)   . Cellulitis   . Cervical spinal stenosis   . Chronic osteomyelitis ([place])   . CKD (chronic kidney disease)   . CKD (chronic kidney disease)   . Clostridium difficile diarrhea   . Depression   . Diplopia   . Fatigue   . GERD (gastroesophageal reflux disease)   . Hyperlipemia   . Hypertension   . Hypovitaminosis D   . IBS (irritable bowel syndrome)   . 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 Insomnia   . Left lumbosacral radiculopathy   . Moderate obstructive sleep apnea   . Osteomyelitis of foot ([place])   . PAD (peripheral artery disease) ([place])   . Pruritus   . RA (rheumatoid arthritis) ([place])   . Radiculopathy   . Restless legs syndrome   . RLS (restless legs syndrome)   . SLE (systemic  lupus erythematosus related syndrome) ([place])   . Stroke ([place])   . Toxic maculopathy of both eyes     Past Surgical History:  Procedure Laterality Date  . CARDIAC CATHETERIZATION    . CAROTID ENDARTERECTOMY    . CERVICAL LAMINECTOMY    . CORONARY ANGIOPLASTY    . FRACTURE SURGERY    . fusion C5-6-7    . HERNIA REPAIR    . KNEE ARTHROSCOPY    . TONSILLECTOMY      There were no vitals filed for this visit.  Subjective Assessment - 08/01/18 1538    Subjective  Pt doing well this date, just finished OT session. no pain or updates to medications. DTR reports exercises going well at home.    Patient is accompained by:  Family member    Pertinent History  Patient is a 73 y.o. male who presents to outpatient physical therapy with a referral for medical diagnosis of CVA. This patient's chief complaints consist of left hemiplegia and overall deconditioning leading to the following functional deficits: dependent for ADLs, IADLs, unable to transfer to car, dependent transfers at home with hoyer lift, unable to walk or stand without significant assistance, difficulty with W/C navigation.Marland Kitchen  Limitations  Sitting;Lifting;Standing;House hold activities;Writing;Walking;Other (comment)    Diagnostic tests  Brain MRI 11/01/2017: IMPRESSION: 1. Acute right ACA territory infarct as demonstrated on prior CT imaging. No intracranial hemorrhage or significant mass effect. 2. 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    Patient Stated Goals  wants to be able to walk again    Currently in Pain?  No/denies       INTERVENTION THIS DATE: -STS from chair x2 (dependent style) MaxA+2 from [facility], heavy tactile cues for postural extension, which are minimally effective 2/2 falls anxiety -BUE reach to floor with return to postural extension and upright sitting; 2x5 with chair back support, 1x5 without support, minA for intermittent tactile cues for extension, min-modA intermittently for  trunk support to prevent Left lateral lean (and LUE support too)  -Tall sitting with Lateral reach and return to center, 1x10 bilat (rest needed when coming back from Left reach)  -STS from chair with BUE grip on treadmill bar to promote confidence and forward lean; 3x5x10sec stance, author provides Knee block Left, and intermittent MaxA anterior pelvic weight shift. (most performed from airex pad in WC)  -Sitting postural extension stretch, positioned with towels under scapular t spine, tactile cues for scapular retaction 5x30sec -seated scapular retraction with want, tactile cues for form with straight weight pushing into chest.     PT Short Term Goals - 08/01/18 1543      PT SHORT TERM GOAL #1   Title  Be independent with initial home exercise program for self-management of symptoms.    Baseline  HEP to be given at second session; advice to practice unsupported sitting at home (06/19/2018); 07/08/18: Performing at home    Time  2    Status  On-going    Target Date  06/26/18        PT Long Term Goals - 07/30/18 1429      PT LONG TERM GOAL #1   Title  Patient will complete all bed mobility with min A to improve functional independence for getting in and out of bed and adjusting in bed.     Baseline  max A - total A reported by family (06/19/2018); 07/08/18: still requires maxA, dtr reports improvement in rolling since starting therapy; 6/23: min A to supervision in clinic; not doing at home right    Time  12    Period  Weeks    Status  On-going      PT LONG TERM GOAL #2   Title  Patient will complete sit <> stand transfer chair to chair with Min A and LRAD to improve functional independence for household and community mobility.     Baseline  max A +2 STS at W/C (06/12/2018); max A +2-3 STS or stand pivot transfer, able to complete more reps (06/19/2018); 07/08/18: not safe to attempt at this time    Time  12    Period  Weeks    Status  On-going    Target Date  09/11/18      PT LONG TERM  GOAL #3   Title  Patient will navigate manual w/c with min A x 100 feet to improve mobility for household and short community distances.     Baseline  required total A (06/12/2018); able to roll 20 feet with min A (06/20/2018); 07/08/18: Pt can go approximately 10' without assist per family;    Time  12    Period  Weeks    Status  On-going  Target Date  09/11/18      PT LONG TERM GOAL #4   Title  Patient will complete car transfers W/C to family SUV with min A using LRAD to improve his abilty to participate in community activities.     Baseline  unable to complete car transfers at all (06/12/2018); unable to complete car transfers at all (06/19/2018).; 07/08/18: Unable to perform at this time    Time  12    Period  Weeks    Status  On-going    Target Date  09/11/18      PT LONG TERM GOAL #5   Title  Patient will ambulate at least 150 feet with mod I using LRAD to improve mobility for household and community distances.     Baseline  unable to take steps (06/12/2018); able to shift R leg forward and back with max A and RW at edge of plinth (06/19/2018); 07/08/18: unable to ambulate at this time.     Time  12    Period  Weeks    Status  On-going    Target Date  09/11/18      PT LONG TERM GOAL #6   Title  Patient will navigate 4 steps with min A and BUE support to improve his ability to access community and social participation.     Baseline  unable to complet stairs (06/12/2018); unable to complete stairs (06/19/2018); 07/08/18: unable to attempt at this time    Time  12    Period  Weeks    Status  On-going    Target Date  09/11/18            Plan - 08/01/18 1542    Clinical Impression Statement  Pt tolerating session well this date, responds well to verbal and tactile cues throughout session to correct form and postural correction, confidence improved, but still limited and limiting at times. DTR seems very pleased with the amount of time patient was able to perform in standing this date, and  appreciates the education on fixing patient's WC and how to modify for postural stretches as needed with towels at home. Pt reports some pain in bilat ilia after several STS transfers and sustained standing, suspect gluteal/hip joint tightness contributing. Pt limited by fatigue, but puts forth great effort to work reporting "I want to walk again."    Comorbidities  Relevant past medical history and comorbidities include long term steroid use for lupus, CKD, chronic osteomyelitis, cervical spine stenosis, BPH, APS, alcohol abuse, peripheral artery disease, depression, GERD, obstructive sleep apnea, HTN, IBS, IgA deficiency, lumbar radiculopathy, RA, Stroke, toxic maculopathy in both eyes, systemic lupus erythematosus related syndrome, cardiac catheterization, carotid endarterectomy, cervical laminectomy, coronary angioplasty, Fusion C5-C7, knee arthroplasty, lumbar surgery.    Examination-Activity Limitations  Bed Mobility;Bend;Sit;Toileting;Stand;Stairs;Lift;Transfers;Squat;Locomotion Level;Carry;Dressing;Hygiene/Grooming;Continence    Examination-Participation Restrictions  Yard Work;Interpersonal Relationship;Community Activity    Stability/Clinical Decision Making  Evolving/Moderate complexity    Clinical Decision Making  Moderate    Rehab Potential  Fair    PT Frequency  3x / week    PT Duration  12 weeks    PT Treatment/Interventions  ADLs/Self Care Home Management;Electrical Stimulation;Moist Heat;Cryotherapy;Gait training;Stair training;Functional mobility training;Therapeutic exercise;Balance training;Neuromuscular re-education;Cognitive remediation;Patient/family education;Orthotic Fit/Training;Wheelchair mobility training;Manual techniques;Passive range of motion;Energy conservation;Joint Manipulations    PT Next Visit Plan  Progress note and any outcome measures as needed, work on functional strength and establish HEP    PT Home Exercise Plan  No updates today    Consulted and Agree  with Plan  of Care  Patient;Family member/caregiver    Family Member Consulted  daugher [name]       Patient will benefit from skilled therapeutic intervention in order to improve the following deficits and impairments:  Abnormal gait, Decreased activity tolerance, Decreased cognition, Decreased endurance, Decreased knowledge of use of DME, Decreased range of motion, Decreased skin integrity, Decreased strength, Impaired perceived functional ability, Impaired sensation, Impaired UE functional use, Improper body mechanics, Pain, Cardiopulmonary status limiting activity, Decreased balance, Decreased coordination, Decreased mobility, Difficulty walking, Impaired tone, Postural dysfunction  Visit Diagnosis: 1. Muscle weakness (generalized)   2. Other lack of coordination   3. Difficulty in walking, not elsewhere classified   4. Hemiplegia and hemiparesis following cerebral infarction affecting left non-dominant side ([place])   5. Repeated falls        Problem List There are no active problems to display for this patient.  3:47 PM, 08/01/18 Etta Grandchild, PT, DPT Physical Therapist - [place] [phone]     Etta Grandchild 08/01/2018, 3:45 PM  [place] MAIN [facility] SERVICES [address] [place], [place], [address] Phone: [phone]   Fax:  [phone]  Name: GREOGRY GOODWYN MRN: [id number] Date of Birth: Mar 31, 1945

## 2018-08-05 ENCOUNTER — Ambulatory Visit: Payer: Medicare Other

## 2018-08-05 ENCOUNTER — Other Ambulatory Visit: Payer: Self-pay

## 2018-08-05 ENCOUNTER — Encounter: Payer: Self-pay | Admitting: Occupational Therapy

## 2018-08-05 ENCOUNTER — Ambulatory Visit: Payer: Medicare Other | Admitting: Occupational Therapy

## 2018-08-05 DIAGNOSIS — M6281 Muscle weakness (generalized): Secondary | ICD-10-CM

## 2018-08-05 DIAGNOSIS — R262 Difficulty in walking, not elsewhere classified: Secondary | ICD-10-CM

## 2018-08-05 DIAGNOSIS — R278 Other lack of coordination: Secondary | ICD-10-CM

## 2018-08-05 NOTE — Therapy (Addendum)
[place] MAIN [facility] SERVICES [address] [place], [place], [address] Phone: [phone]   Fax:  [phone]  Occupational Therapy Progress Note  Dates of reporting period  06/12/2018   to   08/05/2018  Patient Details  Name: Kenneth Summers MRN: [id number] Date of Birth: 06-18-1945 No data recorded  Encounter Date: 08/05/2018  OT End of Session - 08/05/18 1716    Visit Number  20    Number of Visits  24    Date for OT Re-Evaluation  09/04/18    OT Start Time  1430    OT Stop Time  1515    OT Time Calculation (min)  45 min    Activity Tolerance  Patient tolerated treatment well    Behavior During Therapy  [facility] for tasks assessed/performed       Past Medical History:  Diagnosis Date  . Alcohol abuse   . APS (antiphospholipid syndrome) ([place])   . BPH (benign prostatic hyperplasia)   . CAD (coronary artery disease)   . Cellulitis   . Cervical spinal stenosis   . Chronic osteomyelitis ([place])   . CKD (chronic kidney disease)   . CKD (chronic kidney disease)   . Clostridium difficile diarrhea   . Depression   . Diplopia   . Fatigue   . GERD (gastroesophageal reflux disease)   . Hyperlipemia   . Hypertension   . Hypovitaminosis D   . IBS (irritable bowel syndrome)   . IgA deficiency ([place])   . Insomnia   . Left lumbosacral radiculopathy   . Moderate obstructive sleep apnea   . Osteomyelitis of foot ([place])   . PAD (peripheral artery disease) ([place])   . Pruritus   . RA (rheumatoid arthritis) ([place])   . Radiculopathy   . Restless legs syndrome   . RLS (restless legs syndrome)   . SLE (systemic lupus erythematosus related syndrome) ([place])   . Stroke ([place])   . Toxic maculopathy of both eyes     Past Surgical History:  Procedure Laterality Date  . CARDIAC CATHETERIZATION    . CAROTID ENDARTERECTOMY    . CERVICAL LAMINECTOMY    . CORONARY ANGIOPLASTY    . FRACTURE SURGERY    . fusion C5-6-7    . HERNIA REPAIR    . KNEE  ARTHROSCOPY    . TONSILLECTOMY      There were no vitals filed for this visit.  Subjective Assessment - 08/05/18 1715    Subjective   Pt. reports feeling well today.    Pertinent History  Pt. is a 73 y.o. 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 Pt. PMHx includes: Multiple Falls, Lupus, DJD, Renal Abscess, CVA. Pt. resides with his wife. Pt.'s wife and daughter assist with ADLs. Pt. has caregivers in  for 2 hours a day, 6 days a week. Pt. received Rehab services in acute care, at SNF for STR, and [facility] services. Pt. is retired from [employer] for [employer] and [employer].    Patient Stated Goals  To regain use of his left UE, and do more for himself.    Currently in Pain?  No/denies      OT TREATMENT    Therapeutic Exercise:  Pt. worked on left pinch strengthening in the left hand for lateral, and 3pt. pinch using yellow, red, green, and blue resistive clips. Pt. worked on placing the clips at various vertical and horizontal angles. Tactile and verbal cues  were required for eliciting the desired movement.  Selfcare:  Pt. worked on Therapist, art medication bottle labels. Pt. was able to accurately problem solve  7/10 questions when navigating various simulated medication labels. Pt. required increased time, and cues for 3 of the questions. Pt. Required additional cues for expiration dates, and dates issued. Pt. wore glasses during this task.                          OT Education - 08/05/18 1716    Education Details  Left hand [place] skills    Person(s) Educated  Patient    Methods  Explanation;Demonstration    Comprehension  Verbalized understanding;Returned demonstration          OT Long Term Goals - 07/10/18 1638      OT LONG TERM GOAL #1   Title  Pt. will increase left shoulder flexion AROM by 10 degrees to access cabinet/shelf.    Baseline  07/10/2018: AROM left shoulder flexion: 104    Time  12    Period   Weeks    Status  On-going    Target Date  09/04/18      OT LONG TERM GOAL #2   Title  Pt. will donn a shirt with Supervision.    Baseline  07/10/2018: Min-ModA    Time  12    Period  Weeks    Status  New    Target Date  09/04/18      OT LONG TERM GOAL #3   Title  Pt. will require ModA to perform LE dressing    Baseline  Eval: MaxA left, ModA with the right    Time  12    Period  Weeks    Status  On-going    Target Date  09/04/18      OT LONG TERM GOAL #4   Title  Pt. will improve left grip strength by 10# to be able to open a jar/container.    Baseline  07/10/2018: Left: 29, Right 40    Time  12    Period  Weeks    Status  On-going    Target Date  09/04/18      OT LONG TERM GOAL #5   Title  Pt. will improve left hand [facility] skills to be able to assit with buttoning/zipping.    Baseline  Eval: Left: 1 min & 52 sec. R: 56 sec.    Time  12    Period  Weeks    Status  New    Target Date  09/04/18      OT LONG TERM GOAL #6   Title  Pt. will demonstrate visual conmpensatory strategies during 100% of the time during self care.    Baseline  Continue    Time  12    Period  Weeks    Status  On-going    Target Date  09/04/18      OT LONG TERM GOAL #7   Title  Pt. will prepare a simple cold snack from w/c with supervision using cognitive compensatory strategies 100% of the time.    Baseline  Pt. is unable    Time  12    Period  Weeks    Status  On-going    Target Date  09/04/18      OT LONG TERM GOAL #8   Title  Pt. will accurately identify potential safety hazard using good safety awareness, and judgement 100% for  ADLs, and IADLs.    Baseline  Continue    Time  12    Period  Weeks    Status  On-going    Target Date  09/04/18            Plan - 08/05/18 1717    Clinical Impression Statement  Pt. is making progress with his left UE strength, and [facility] skills, sustained attention during tasks, and left sided awareness. Pt. conitnues to worked on improving sustained  attention duirng tasks with increased environmental stimulae. Pt. continues to work on improving these tasks, working towards the goals established at the eval.    Occupational performance deficits (Please refer to evaluation for details):  ADL's    Cognitive Skills  Attention;Memory;Emotional;Problem Solve;Safety Awareness    Rehab Potential  Good    Clinical Decision Making  Multiple treatment options, significant modification of task necessary    Comorbidities Affecting Occupational Performance:  Presence of comorbidities impacting occupational performance    Comorbidities impacting occupational performance description:  Phyical, cognitive, visual,  medical comorbidities    Modification or Assistance to Complete Evaluation   Max significant modification of tasks or assist is necessary to complete    OT Frequency  3x / week    OT Duration  12 weeks    OT Treatment/Interventions  Self-care/ADL training;DME and/or AE instruction;Therapeutic exercise;Therapeutic activities;Moist Heat;Cognitive remediation/compensation;Neuromuscular education;Visual/perceptual remediation/compensation;Coping strategies training;Patient/family education;Passive range of motion;Psychosocial skills training;Energy conservation;Functional Mobility Training    Recommended Other Services  PT    Consulted and Agree with Plan of Care  Patient;Family member/caregiver    Family Member Consulted  Daughter [name]       Patient will benefit from skilled therapeutic intervention in order to improve the following deficits and impairments:     Cognitive Skills: Attention, Memory, Emotional, Problem Solve, Safety Awareness     Visit Diagnosis: 1. Muscle weakness (generalized)   2. Other lack of coordination       Problem List There are no active problems to display for this patient.   Harrel Carina, MS, OTR/L 08/05/2018, 5:29 PM  [place] MAIN [facility] SERVICES [address]  [place], [place], [address] Phone: [phone]   Fax:  [phone]  Name: Kenneth Summers MRN: [id number] Date of Birth: 1945-02-09

## 2018-08-06 ENCOUNTER — Ambulatory Visit: Payer: Medicare Other | Admitting: Occupational Therapy

## 2018-08-06 ENCOUNTER — Encounter: Payer: Self-pay | Admitting: Physical Therapy

## 2018-08-06 ENCOUNTER — Ambulatory Visit: Payer: Medicare Other | Admitting: Physical Therapy

## 2018-08-06 ENCOUNTER — Encounter: Payer: Self-pay | Admitting: Occupational Therapy

## 2018-08-06 ENCOUNTER — Other Ambulatory Visit: Payer: Self-pay

## 2018-08-06 DIAGNOSIS — M6281 Muscle weakness (generalized): Secondary | ICD-10-CM

## 2018-08-06 DIAGNOSIS — R262 Difficulty in walking, not elsewhere classified: Secondary | ICD-10-CM

## 2018-08-06 DIAGNOSIS — R278 Other lack of coordination: Secondary | ICD-10-CM

## 2018-08-06 NOTE — Therapy (Signed)
[place] MAIN [facility] SERVICES [address] [place], [place], [address] Phone: [phone]   Fax:  [phone]  Occupational Therapy Treatment  Patient Details  Name: Kenneth Summers MRN: [id number] Date of Birth: [birth year] No data recorded  Encounter Date: 08/06/2018  OT End of Session - 08/06/18 1736    Visit Number  21    Number of Visits  24    Date for OT Re-Evaluation  09/04/18    OT Start Time  1530    OT Stop Time  1615    OT Time Calculation (min)  45 min    Activity Tolerance  Patient tolerated treatment well    Behavior During Therapy  [facility] for tasks assessed/performed       Past Medical History:  Diagnosis Date  . Alcohol abuse   . APS (antiphospholipid syndrome) ([place])   . BPH (benign prostatic hyperplasia)   . CAD (coronary artery disease)   . Cellulitis   . Cervical spinal stenosis   . Chronic osteomyelitis ([place])   . CKD (chronic kidney disease)   . CKD (chronic kidney disease)   . Clostridium difficile diarrhea   . Depression   . Diplopia   . Fatigue   . GERD (gastroesophageal reflux disease)   . Hyperlipemia   . Hypertension   . Hypovitaminosis D   . IBS (irritable bowel syndrome)   . 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 Insomnia   . Left lumbosacral radiculopathy   . Moderate obstructive sleep apnea   . Osteomyelitis of foot ([place])   . PAD (peripheral artery disease) ([place])   . Pruritus   . RA (rheumatoid arthritis) ([place])   . Radiculopathy   . Restless legs syndrome   . RLS (restless legs syndrome)   . SLE (systemic lupus erythematosus related syndrome) ([place])   . Stroke ([place])   . Toxic maculopathy of both eyes     Past Surgical History:  Procedure Laterality Date  . CARDIAC CATHETERIZATION    . CAROTID ENDARTERECTOMY    . CERVICAL LAMINECTOMY    . CORONARY ANGIOPLASTY    . FRACTURE SURGERY    . fusion C5-6-7    . HERNIA REPAIR    . KNEE ARTHROSCOPY    . TONSILLECTOMY      There were no vitals filed  for this visit.   OT TREATMENT    Therapeutic Exercise:  Pt. performed gross gripping with grip strengthener. Pt. worked on sustaining grip while grasping pegs and reaching at various heights. Gripper was placed in the 3rd resistive slot with the white resistive spring, and moved to the 2nd, and 1st slots. Pt. Worked on pinch strengthening in the left hand for lateral, and 3pt. pinch using yellow, red, green, and blue resistive clips. Pt. worked on placing the clips at various vertical and horizontal angles. Tactile and verbal cues were required for eliciting the desired movement.   Selfcare:  Pt. worked on formulating responses for home safety awareness, and situational judgement. Pt. Was able to respond accurately to the home situation questions, however required cues to elaborate on the answers with more detail in wriitng.                       OT Education - 08/06/18 1736    Education Details  Left hand [place] skills    Person(s) Educated  Patient    Methods  Explanation;Demonstration    Comprehension  Verbalized understanding;Returned demonstration  OT Long Term Goals - 08/05/18 1727      OT LONG TERM GOAL #1   Title  Pt. will increase left shoulder flexion AROM by 10 degrees to access cabinet/shelf.    Baseline  AROM left shoulder flexion: 104    Time  12    Period  Weeks    Status  On-going    Target Date  09/04/18      OT LONG TERM GOAL #2   Title  Pt. will donn a shirt with Supervision.    Baseline  Min-ModA    Time  12    Period  Weeks    Status  On-going    Target Date  09/04/18      OT LONG TERM GOAL #3   Title  Pt. will require ModA to perform LE dressing    Baseline  MaxA left, ModA with the right    Time  12    Period  Weeks    Status  On-going    Target Date  09/04/18      OT LONG TERM GOAL #4   Title  Pt. will improve left grip strength by 10# to be able to open a jar/container.    Baseline  Left: 29, Right 40    Time   12    Status  On-going    Target Date  09/04/18      OT LONG TERM GOAL #5   Title  Pt. will improve left hand [facility] skills to be able to assit with buttoning/zipping.    Baseline  Left: 1 min & 52 sec. R: 56 sec.    Time  12    Period  Weeks    Status  On-going    Target Date  09/04/18      OT LONG TERM GOAL #6   Title  Pt. will demonstrate visual conmpensatory strategies during 100% of the time during self care.    Baseline  Continue    Time  12    Period  Weeks    Status  On-going    Target Date  09/04/18      OT LONG TERM GOAL #7   Title  Pt. will prepare a simple cold snack from w/c with supervision using cognitive compensatory strategies 100% of the time.    Baseline  Pt. is unable    Time  12    Period  Weeks    Status  On-going    Target Date  09/04/18      OT LONG TERM GOAL #8   Title  Pt. will accurately identify potential safety hazard using good safety awareness, and judgement 100% for ADLs, and IADLs.    Baseline  Continue    Time  12    Period  Weeks    Status  On-going    Target Date  09/04/18            Plan - 08/06/18 1737    Clinical Impression Statement  Pt. continues to make progress with LUE strength, motor control, and [facility] skills. Pt. continues to work on improving sustained attention during tasks with increasedenvironmental stimulae. Pt. continues to work on improving LUE functioning in order to improve overall engagement in ADL, and IADL tasks.    Occupational performance deficits (Please refer to evaluation for details):  ADL's    Body Structure / Function / Physical Skills  UE functional use;Coordination;FMC;Dexterity;Strength;ROM    Cognitive Skills  Attention;Memory;Emotional;Problem Solve;Safety Awareness    Rehab  Potential  Good    Clinical Decision Making  Multiple treatment options, significant modification of task necessary    Comorbidities impacting occupational performance description:  Phyical, cognitive, visual,  medical  comorbidities    Modification or Assistance to Complete Evaluation   Max significant modification of tasks or assist is necessary to complete    OT Frequency  3x / week    OT Duration  12 weeks    OT Treatment/Interventions  Self-care/ADL training;DME and/or AE instruction;Therapeutic exercise;Therapeutic activities;Moist Heat;Cognitive remediation/compensation;Neuromuscular education;Visual/perceptual remediation/compensation;Coping strategies training;Patient/family education;Passive range of motion;Psychosocial skills training;Energy conservation;Functional Mobility Training    Consulted and Agree with Plan of Care  Patient;Family member/caregiver    Family Member Consulted  Daughter [name]       Patient will benefit from skilled therapeutic intervention in order to improve the following deficits and impairments:   Body Structure / Function / Physical Skills: UE functional use, Coordination, FMC, Dexterity, Strength, ROM Cognitive Skills: Attention, Memory, Emotional, Problem Solve, Safety Awareness     Visit Diagnosis: 1. Muscle weakness (generalized)   2. Other lack of coordination       Problem List There are no active problems to display for this patient.   Harrel Carina, MS,OTR/L 08/06/2018, 5:44 PM  [place] MAIN [facility] SERVICES [address] [place], [place], [address] Phone: [phone]   Fax:  [phone]  Name: Kenneth Summers MRN: [id number] Date of Birth: 1946/01/29

## 2018-08-06 NOTE — Therapy (Signed)
[place] MAIN [facility] SERVICES [address] [place], [place], [address] Phone: [phone]   Fax:  [phone]  Physical Therapy Treatment Physical Therapy Progress Note   Dates of reporting period  06/19/18   to   07/30/18   Patient Details  Name: Kenneth Summers MRN: [id number] Date of Birth: 1945/11/25 Referring Provider (PT): Dr. [name], Guy Begin   Encounter Date: 07/30/2018  PT End of Session - 08/05/18 1322    Visit Number  20    Number of Visits  36    Date for PT Re-Evaluation  09/11/18    Authorization Type  Medicare reporting period from 06/12/2018    Authorization Time Period  Current cert period 9/79/8921 - 09/11/2018    PT Start Time  1432    PT Stop Time  1515    PT Time Calculation (min)  43 min    Equipment Utilized During Treatment  Gait belt;Other (comment)   cloth gait belt   Activity Tolerance  Patient tolerated treatment well;Patient limited by fatigue    Behavior During Therapy  [facility] for tasks assessed/performed       Past Medical History:  Diagnosis Date  . Alcohol abuse   . APS (antiphospholipid syndrome) ([place])   . BPH (benign prostatic hyperplasia)   . CAD (coronary artery disease)   . Cellulitis   . Cervical spinal stenosis   . Chronic osteomyelitis ([place])   . CKD (chronic kidney disease)   . CKD (chronic kidney disease)   . Clostridium difficile diarrhea   . Depression   . Diplopia   . Fatigue   . GERD (gastroesophageal reflux disease)   . Hyperlipemia   . Hypertension   . Hypovitaminosis D   . IBS (irritable bowel syndrome)   . 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 Insomnia   . Left lumbosacral radiculopathy   . Moderate obstructive sleep apnea   . Osteomyelitis of foot ([place])   . PAD (peripheral artery disease) ([place])   . Pruritus   . RA (rheumatoid arthritis) ([place])   . Radiculopathy   . Restless legs syndrome   . RLS (restless legs syndrome)   . SLE (systemic lupus erythematosus related syndrome)  ([place])   . Stroke ([place])   . Toxic maculopathy of both eyes     Past Surgical History:  Procedure Laterality Date  . CARDIAC CATHETERIZATION    . CAROTID ENDARTERECTOMY    . CERVICAL LAMINECTOMY    . CORONARY ANGIOPLASTY    . FRACTURE SURGERY    . fusion C5-6-7    . HERNIA REPAIR    . KNEE ARTHROSCOPY    . TONSILLECTOMY      There were no vitals filed for this visit.  Subjective Assessment - 08/06/18 0753    Subjective  Patient reports feeling well; not too tired; denies any pain; Denies any new falls;    Patient is accompained by:  Family member    Pertinent History  Patient is a 73 y.o. male who presents to outpatient physical therapy with a referral for medical diagnosis of CVA. This patient's chief complaints consist of left hemiplegia and overall deconditioning leading to the following functional deficits: dependent for ADLs, IADLs, unable to transfer to car, dependent transfers at home with hoyer lift, unable to walk or stand without significant assistance, difficulty with W/C navigation..    Limitations  Sitting;Lifting;Standing;House hold activities;Writing;Walking;Other (comment)    Diagnostic tests  Brain MRI 11/01/2017: IMPRESSION: 1. Acute right ACA territory  infarct as demonstrated on prior CT imaging. No intracranial hemorrhage or significant mass effect. 2. 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    Patient Stated Goals  wants to be able to walk again    Currently in Pain?  No/denies         [facility] PT Assessment - 08/06/18 0001      Strength   Right Hip Flexion  4/5    Right Hip ABduction  4/5    Left Hip Flexion  2-/5    Left Hip Extension  3-/5    Left Hip ABduction  2+/5    Left Hip ADduction  3-/5    Left Knee Flexion  2-/5    Left Knee Extension  2+/5              TREATMENT: Therapeutic activity: Patient transferred squat pivot wheelchair to mat table, mod A +2 with mod VCs for trunk control, hand placement and  positioning; Provided patient with increased time to scoot forward to edge of chair and to shift hips to better position prior to transfer. He required mod A for positioning feet for better position;   At end of session patient instructed in sit<>Stand transfer with plan to pivot to wheelchair; He required mod A +2 with mod VCs for trunk position and hand placement; unfortunately, patient unable to come to full standing and performed a squat pivot transfer to wheelchair.  He requires therapist assist to block left knee for better positioning;   Instructed patient in bed mobility: Min A for sit to sidelying with therapist cues to cross right foot under left foot and to use right foot to assist lifting left leg onto mat table; Patient then rolled hooklying, min A +1  Rolling to right side,supervision with significant increased time required andwith mod VCs for UE positioning and LE positioning and to use mat table to pull self to right side. Patient requires significant increased time with cues for sequencing and coordination;   Transitioned sidelying to sitting with min A +1-2 with cues for LE positioning and UE positioning; initially required +2 assist to lift trunk off mat table, but progressed to +1 assist with increased time required to complete task. Pt slow with bed mobility requiring increased time to initiate movement due to decreased motor planning;  Exercise: PT assessed LE strength, see above:  Instructed patient in LE strengthening and flexibility exercise: Patient hooklying: -bridges with arms across chest x15reps; pt able to hold legs into neutral position; instructed patient in posterior pelvic rock prior to hip extension for better gluteal activation; -SLR hip flexion x10 reps with AAROM on LLE; able to initiate ROM on LLE but requires AAROM for lifting LLE due to weakness; -SAQ x10 reps bilaterally with AAROM on LLE for full ROM. Patient able to achieve partial ROM on LLE  but is unable to fully lift against gravity;  Right sidelying: LLE clamshells x10 reps with mod VCs for positioning to isolate gluteal activation; only able to achieve partial ROM due to weakness;    Response to treatment: Patient tolerated session fair; He was more fatigued today requiring increased time for motor planning and muscle activation. Patient had increased difficulty with initial transfers being able to rock into stand but having difficulty with pivot transfer to chair. Patient instructed in LE exercise on mat table for better motor activation and exercise. Following exercise he was able to transfer better with improved motor activation. Recommend patient begin session with  mat exercise for increased flexibility and strength training prior to mobility activities for better participation in session;  [name] condition has the potential to improve in response to therapy. Maximum improvement is yet to be obtained. The anticipated improvement is attainable and reasonable in a generally predictable time.  Patient reports adherence with HEP and is motivated to continue with therapy;                  PT Education - 08/06/18 0754    Education Details  transfers/strengthening; HEP reinforced; progress towards goals    Person(s) Educated  Patient;Child(ren)    Methods  Explanation;Verbal cues    Comprehension  Verbalized understanding;Verbal cues required;Need further instruction       PT Short Term Goals - 08/05/18 1326      PT SHORT TERM GOAL #1   Title  Be independent with initial home exercise program for self-management of symptoms.    Baseline  HEP to be given at second session; advice to practice unsupported sitting at home (06/19/2018); 07/08/18: Performing at home, 6/29: needs assistance but is doing exercise program regularly;    Time  2    Period  Weeks    Status  On-going    Target Date  06/26/18        PT Long Term Goals - 07/30/18 1429      PT LONG TERM  GOAL #1   Title  Patient will complete all bed mobility with min A to improve functional independence for getting in and out of bed and adjusting in bed.     Baseline  max A - total A reported by family (06/19/2018); 07/08/18: still requires maxA, dtr reports improvement in rolling since starting therapy; 6/23: min A to supervision in clinic; not doing at home right    Time  12    Period  Weeks    Status  On-going    Target Date  09/11/18      PT LONG TERM GOAL #2   Title  Patient will complete sit <> stand transfer chair to chair with Min A and LRAD to improve functional independence for household and community mobility.     Baseline  max A +2 STS at W/C (06/12/2018); max A +2-3 STS or stand pivot transfer, able to complete more reps (06/19/2018); 07/08/18: not safe to attempt at this time    Time  12    Period  Weeks    Status  On-going    Target Date  09/11/18      PT LONG TERM GOAL #3   Title  Patient will navigate manual w/c with min A x 100 feet to improve mobility for household and short community distances.     Baseline  required total A (06/12/2018); able to roll 20 feet with min A (06/20/2018); 07/08/18: Pt can go approximately 10' without assist per family;    Time  12    Period  Weeks    Status  On-going    Target Date  09/11/18      PT LONG TERM GOAL #4   Title  Patient will complete car transfers W/C to family SUV with min A using LRAD to improve his abilty to participate in community activities.     Baseline  unable to complete car transfers at all (06/12/2018); unable to complete car transfers at all (06/19/2018).; 07/08/18: Unable to perform at this time    Time  12    Period  Weeks  Status  On-going    Target Date  09/11/18      PT LONG TERM GOAL #5   Title  Patient will ambulate at least 150 feet with mod I using LRAD to improve mobility for household and community distances.     Baseline  unable to take steps (06/12/2018); able to shift R leg forward and back with max A and RW at  edge of plinth (06/19/2018); 07/08/18: unable to ambulate at this time.     Time  12    Period  Weeks    Status  On-going    Target Date  09/11/18      PT LONG TERM GOAL #6   Title  Patient will navigate 4 steps with min A and BUE support to improve his ability to access community and social participation.     Baseline  unable to complet stairs (06/12/2018); unable to complete stairs (06/19/2018); 07/08/18: unable to attempt at this time    Time  12    Period  Weeks    Status  On-going    Target Date  09/11/18            Plan - 08/05/18 1323    Clinical Impression Statement  Patient tolerated session well. He is motivated and willing to participate. Patient continues to make improvement in muscle activation in LLE, although its slow. He is able to exhibit better AROM against gravity but is still unable to fully lift leg against gravity. Patient is also making improvements in transfers and bed mobility. He continues to require cues for motor planning and positioning to allow for optimum participation and better safety awareness. He would benefit from additional skilled PT intervention to improve strength, balance and mobility;    Comorbidities  Relevant past medical history and comorbidities include long term steroid use for lupus, CKD, chronic osteomyelitis, cervical spine stenosis, BPH, APS, alcohol abuse, peripheral artery disease, depression, GERD, obstructive sleep apnea, HTN, IBS, IgA deficiency, lumbar radiculopathy, RA, Stroke, toxic maculopathy in both eyes, systemic lupus erythematosus related syndrome, cardiac catheterization, carotid endarterectomy, cervical laminectomy, coronary angioplasty, Fusion C5-C7, knee arthroplasty, lumbar surgery.    Examination-Activity Limitations  Bed Mobility;Bend;Sit;Toileting;Stand;Stairs;Lift;Transfers;Squat;Locomotion Level;Carry;Dressing;Hygiene/Grooming;Continence    Examination-Participation Restrictions  Yard Work;Interpersonal Relationship;Community  Activity    Stability/Clinical Decision Making  Evolving/Moderate complexity    Rehab Potential  Fair    PT Frequency  3x / week    PT Duration  12 weeks    PT Treatment/Interventions  ADLs/Self Care Home Management;Electrical Stimulation;Moist Heat;Cryotherapy;Gait training;Stair training;Functional mobility training;Therapeutic exercise;Balance training;Neuromuscular re-education;Cognitive remediation;Patient/family education;Orthotic Fit/Training;Wheelchair mobility training;Manual techniques;Passive range of motion;Energy conservation;Joint Manipulations    PT Next Visit Plan  Progress note and any outcome measures as needed, work on functional strength and establish HEP    PT Home Exercise Plan  No updates today    Consulted and Agree with Plan of Care  Patient;Family member/caregiver    Family Member Consulted  daugher [name]       Patient will benefit from skilled therapeutic intervention in order to improve the following deficits and impairments:  Abnormal gait, Decreased activity tolerance, Decreased cognition, Decreased endurance, Decreased knowledge of use of DME, Decreased range of motion, Decreased skin integrity, Decreased strength, Impaired perceived functional ability, Impaired sensation, Impaired UE functional use, Improper body mechanics, Pain, Cardiopulmonary status limiting activity, Decreased balance, Decreased coordination, Decreased mobility, Difficulty walking, Impaired tone, Postural dysfunction  Visit Diagnosis: 1. Muscle weakness (generalized)   2. Other lack of coordination   3. Difficulty  in walking, not elsewhere classified   4. Hemiplegia and hemiparesis following cerebral infarction affecting left non-dominant side ([place])        Problem List There are no active problems to display for this patient.   Kamareon Sciandra PT, DPT 08/06/2018, 7:55 AM  [place] MAIN [facility] SERVICES [address] [place], [place],  [address] Phone: [phone]   Fax:  [phone]  Name: Kenneth Summers MRN: [id number] Date of Birth: 03-31-1945

## 2018-08-06 NOTE — Therapy (Signed)
[employer] [place] MAIN [facility] SERVICES [address] [place], [place], [address] Phone: [phone]   Fax:  [phone]  Physical Therapy Treatment  Patient Details  Name: Kenneth Summers MRN: [id number] Date of Birth: [birth year] Referring Provider (PT): Dr. [name], Guy Begin   Encounter Date: 08/06/2018  PT End of Session - 08/06/18 1429    Visit Number  23    Number of Visits  36    Date for PT Re-Evaluation  09/11/18    Authorization Type  Medicare reporting period from 06/12/2018    Authorization Time Period  Current cert period 1/76/1607 - 09/11/2018    PT Start Time  1431    PT Stop Time  1515    PT Time Calculation (min)  44 min    Equipment Utilized During Treatment  Gait belt;Other (comment)   cloth gait belt   Activity Tolerance  Patient tolerated treatment well;Patient limited by fatigue    Behavior During Therapy  [facility] for tasks assessed/performed       Past Medical History:  Diagnosis Date  . Alcohol abuse   . APS (antiphospholipid syndrome) ([place])   . BPH (benign prostatic hyperplasia)   . CAD (coronary artery disease)   . Cellulitis   . Cervical spinal stenosis   . Chronic osteomyelitis ([place])   . CKD (chronic kidney disease)   . CKD (chronic kidney disease)   . Clostridium difficile diarrhea   . Depression   . Diplopia   . Fatigue   . GERD (gastroesophageal reflux disease)   . Hyperlipemia   . Hypertension   . Hypovitaminosis D   . IBS (irritable bowel syndrome)   . 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 Insomnia   . Left lumbosacral radiculopathy   . Moderate obstructive sleep apnea   . Osteomyelitis of foot ([place])   . PAD (peripheral artery disease) ([place])   . Pruritus   . RA (rheumatoid arthritis) ([place])   . Radiculopathy   . Restless legs syndrome   . RLS (restless legs syndrome)   . SLE (systemic lupus erythematosus related syndrome) ([place])   . Stroke ([place])   . Toxic maculopathy of both eyes     Past Surgical History:   Procedure Laterality Date  . CARDIAC CATHETERIZATION    . CAROTID ENDARTERECTOMY    . CERVICAL LAMINECTOMY    . CORONARY ANGIOPLASTY    . FRACTURE SURGERY    . fusion C5-6-7    . HERNIA REPAIR    . KNEE ARTHROSCOPY    . TONSILLECTOMY      There were no vitals filed for this visit.  Subjective Assessment - 08/06/18 1429    Subjective  Patient reports doing well; no pain; not too tired; HEP is going well;    Patient is accompained by:  Family member    Pertinent History  Patient is a 73 y.o. male who presents to outpatient physical therapy with a referral for medical diagnosis of CVA. This patient's chief complaints consist of left hemiplegia and overall deconditioning leading to the following functional deficits: dependent for ADLs, IADLs, unable to transfer to car, dependent transfers at home with hoyer lift, unable to walk or stand without significant assistance, difficulty with W/C navigation..    Limitations  Sitting;Lifting;Standing;House hold activities;Writing;Walking;Other (comment)    Diagnostic tests  Brain MRI 11/01/2017: IMPRESSION: 1. Acute right ACA territory infarct as demonstrated on prior CT imaging. No intracranial hemorrhage or significant mass effect. 2. 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    Patient Stated Goals  wants to be able to walk again    Currently in Pain?  No/denies    Multiple Pain Sites  No            TREATMENT: Therapeutic activity: Patient transferred squat pivot wheelchair to mat table, min A +2 with mod VCs for trunk control, hand placement and positioning; Provided patient with increased time to scoot forward to edge of chair and to shift hips to better position prior to transfer. He required mod A for positioning feet for better position;   Sit<>stand transfer: With RW x3 reps with mod A +2 progressing to min A +2; patient able to hold standing position for 60 sec, 90 sec and 30 sec each with mod VCs  to facilitate erect posture, improve hip/knee extension; he does stand with heavy lean to left side with increased left knee flexion but is able to self correct with improve extension activation with verbal cues; Patient able to stand with RW with min a +1 to keep balance;  At end of session patient instructed in sit<>Stand transfer with plan to pivot to wheelchair; He required mod A +2 with mod VCs for trunk position and hand placement; unfortunately, patient unable to come to full standing and performed a squat pivot transfer to wheelchair.  He requires therapist assist to block left knee for better positioning;   Instructed patient in bed mobility: Min A for sit to sidelying with therapist cues to cross right foot under left foot and to use right foot to assist lifting left leg onto mat table; Patient then rolled hooklying, min A +1  Rolling to right side,patient required min A for initiating roll with LLE with cues for lumbar trunk rotation to utilize momentum for better rotation;  Transitioned sidelying to sitting with supervision requiring cues for LE positioning and UE positioning; Pt slow with bed mobility requiring increased time to initiate movement due to decreased motor planning;  Exercise: Instructed patient in LE strengthening and flexibility exercise: Patient hooklying: -bridges with arms across chest x15reps; pt able to hold legs into neutral position; instructed patient in posterior pelvic rock prior to hip extension for better gluteal activation; -lumbar trunk rotation x5 reps, 10 sec hold to facilitate better hip stretch and flexibility; -passive knee to chest stretch 20 sec hold x2 LLE to facilitate better flexibility; -AAROM hip flexion/extension with manual therapy resistance x10 reps with cues for increased muscle activation;  Seated: LAQ x5 reps each LE with partial ROM on LLE due to weakness and tightness;   Response to treatment: Patient tolerated  sessionwell; He was able to progress with bed mobility and transfers requiring less assistance; He does require increased time for motor planning. Patient fatigues with advanced exercise and mobility; He denies any increase in back pain with advanced exercise or with prolonged standing;                     PT Education - 08/06/18 1429    Education Details  transfers/strengthening, HEP reinforced    Person(s) Educated  Patient;Child(ren)    Methods  Explanation;Verbal cues    Comprehension  Verbalized understanding;Returned demonstration;Verbal cues required;Need further instruction       PT Short Term Goals - 08/05/18 1326      PT SHORT TERM GOAL #1   Title  Be independent with initial home exercise program for self-management of symptoms.    Baseline  HEP to  be given at second session; advice to practice unsupported sitting at home (06/19/2018); 07/08/18: Performing at home, 6/29: needs assistance but is doing exercise program regularly;    Time  2    Period  Weeks    Status  On-going    Target Date  06/26/18        PT Long Term Goals - 07/30/18 1429      PT LONG TERM GOAL #1   Title  Patient will complete all bed mobility with min A to improve functional independence for getting in and out of bed and adjusting in bed.     Baseline  max A - total A reported by family (06/19/2018); 07/08/18: still requires maxA, dtr reports improvement in rolling since starting therapy; 6/23: min A to supervision in clinic; not doing at home right    Time  12    Period  Weeks    Status  On-going    Target Date  09/11/18      PT LONG TERM GOAL #2   Title  Patient will complete sit <> stand transfer chair to chair with Min A and LRAD to improve functional independence for household and community mobility.     Baseline  max A +2 STS at W/C (06/12/2018); max A +2-3 STS or stand pivot transfer, able to complete more reps (06/19/2018); 07/08/18: not safe to attempt at this time    Time  12     Period  Weeks    Status  On-going    Target Date  09/11/18      PT LONG TERM GOAL #3   Title  Patient will navigate manual w/c with min A x 100 feet to improve mobility for household and short community distances.     Baseline  required total A (06/12/2018); able to roll 20 feet with min A (06/20/2018); 07/08/18: Pt can go approximately 10' without assist per family;    Time  12    Period  Weeks    Status  On-going    Target Date  09/11/18      PT LONG TERM GOAL #4   Title  Patient will complete car transfers W/C to family SUV with min A using LRAD to improve his abilty to participate in community activities.     Baseline  unable to complete car transfers at all (06/12/2018); unable to complete car transfers at all (06/19/2018).; 07/08/18: Unable to perform at this time    Time  12    Period  Weeks    Status  On-going    Target Date  09/11/18      PT LONG TERM GOAL #5   Title  Patient will ambulate at least 150 feet with mod I using LRAD to improve mobility for household and community distances.     Baseline  unable to take steps (06/12/2018); able to shift R leg forward and back with max A and RW at edge of plinth (06/19/2018); 07/08/18: unable to ambulate at this time.     Time  12    Period  Weeks    Status  On-going    Target Date  09/11/18      Additional Long Term Goals   Additional Long Term Goals  Yes      PT LONG TERM GOAL #6   Title  Patient will navigate 4 steps with min A and BUE support to improve his ability to access community and social participation.     Baseline  unable  to complet stairs (06/12/2018); unable to complete stairs (06/19/2018); 07/08/18: unable to attempt at this time    Time  12    Period  Weeks    Status  On-going    Target Date  09/11/18      PT LONG TERM GOAL #7   Title  Patient will increase functional reach test to >15 inches in sitting to exhibit improved sitting balance and positioning and reduce fall risk;    Baseline  07/30/18: 12 inches    Time  4     Period  Weeks    Status  New    Target Date  09/11/18            Plan - 08/06/18 1546    Clinical Impression Statement  Patient progressing well. He was able to exhibit better functional mobility requiring less assistance with bed mobility and transfers. Patient does require increased time for motor planning and proper positioning to facilitate better activity. Patient required min VCs for correct exercise technique to improve LE muscle activation. he would benefit from additional skilled PT intervention to improve strength, balance and functional mobility;    Comorbidities  Relevant past medical history and comorbidities include long term steroid use for lupus, CKD, chronic osteomyelitis, cervical spine stenosis, BPH, APS, alcohol abuse, peripheral artery disease, depression, GERD, obstructive sleep apnea, HTN, IBS, IgA deficiency, lumbar radiculopathy, RA, Stroke, toxic maculopathy in both eyes, systemic lupus erythematosus related syndrome, cardiac catheterization, carotid endarterectomy, cervical laminectomy, coronary angioplasty, Fusion C5-C7, knee arthroplasty, lumbar surgery.    Examination-Activity Limitations  Bed Mobility;Bend;Sit;Toileting;Stand;Stairs;Lift;Transfers;Squat;Locomotion Level;Carry;Dressing;Hygiene/Grooming;Continence    Examination-Participation Restrictions  Yard Work;Interpersonal Relationship;Community Activity    Stability/Clinical Decision Making  Evolving/Moderate complexity    Rehab Potential  Fair    PT Frequency  3x / week    PT Duration  12 weeks    PT Treatment/Interventions  ADLs/Self Care Home Management;Electrical Stimulation;Moist Heat;Cryotherapy;Gait training;Stair training;Functional mobility training;Therapeutic exercise;Balance training;Neuromuscular re-education;Cognitive remediation;Patient/family education;Orthotic Fit/Training;Wheelchair mobility training;Manual techniques;Passive range of motion;Energy conservation;Joint Manipulations    PT Next  Visit Plan  Work on strength, sitting/standing balance, functional activities such as rolling, bed mobility, transfers    PT Home Exercise Plan  No updates today    Consulted and Agree with Plan of Care  Patient;Family member/caregiver    Family Member Consulted  daugher [name]       Patient will benefit from skilled therapeutic intervention in order to improve the following deficits and impairments:  Abnormal gait, Decreased activity tolerance, Decreased cognition, Decreased endurance, Decreased knowledge of use of DME, Decreased range of motion, Decreased skin integrity, Decreased strength, Impaired perceived functional ability, Impaired sensation, Impaired UE functional use, Improper body mechanics, Pain, Cardiopulmonary status limiting activity, Decreased balance, Decreased coordination, Decreased mobility, Difficulty walking, Impaired tone, Postural dysfunction  Visit Diagnosis: 1. Muscle weakness (generalized)   2. Other lack of coordination   3. Difficulty in walking, not elsewhere classified        Problem List There are no active problems to display for this patient.   Rebacca Votaw PT, DPT 08/06/2018, 3:48 PM  [place] MAIN [facility] SERVICES [address] [place], [place], [address] Phone: [phone]   Fax:  [phone]  Name: KALE RONDEAU MRN: [id number] Date of Birth: 10-09-1945

## 2018-08-06 NOTE — Therapy (Signed)
[place] MAIN [facility] SERVICES [address] [place], [place], [address] Phone: [phone]   Fax:  [phone]  Physical Therapy Treatment  Patient Details  Name: Kenneth Summers MRN: [id number] Date of Birth: [birth year] Referring Provider (PT): Dr. [name], Guy Begin   Encounter Date: 08/05/2018  PT End of Session - 08/06/18 0819    Visit Number  22    Number of Visits  36    Date for PT Re-Evaluation  09/11/18    Authorization Type  Medicare reporting period from 06/12/2018    Authorization Time Period  Current cert period 8/58/8502 - 09/11/2018    PT Start Time  1330    PT Stop Time  1415    PT Time Calculation (min)  45 min    Equipment Utilized During Treatment  Gait belt;Other (comment)   cloth gait belt   Activity Tolerance  Patient tolerated treatment well;Patient limited by fatigue    Behavior During Therapy  [place] for tasks assessed/performed       Past Medical History:  Diagnosis Date  . Alcohol abuse   . APS (antiphospholipid syndrome) ([place])   . BPH (benign prostatic hyperplasia)   . CAD (coronary artery disease)   . Cellulitis   . Cervical spinal stenosis   . Chronic osteomyelitis ([place])   . CKD (chronic kidney disease)   . CKD (chronic kidney disease)   . Clostridium difficile diarrhea   . Depression   . Diplopia   . Fatigue   . GERD (gastroesophageal reflux disease)   . Hyperlipemia   . Hypertension   . Hypovitaminosis D   . IBS (irritable bowel syndrome)   . 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 Insomnia   . Left lumbosacral radiculopathy   . Moderate obstructive sleep apnea   . Osteomyelitis of foot ([place])   . PAD (peripheral artery disease) ([place])   . Pruritus   . RA (rheumatoid arthritis) ([place])   . Radiculopathy   . Restless legs syndrome   . RLS (restless legs syndrome)   . SLE (systemic lupus erythematosus related syndrome) ([place])   . Stroke ([place])   . Toxic maculopathy of both eyes     Past Surgical History:   Procedure Laterality Date  . CARDIAC CATHETERIZATION    . CAROTID ENDARTERECTOMY    . CERVICAL LAMINECTOMY    . CORONARY ANGIOPLASTY    . FRACTURE SURGERY    . fusion C5-6-7    . HERNIA REPAIR    . KNEE ARTHROSCOPY    . TONSILLECTOMY      There were no vitals filed for this visit.  Subjective Assessment - 08/05/18 1325    Subjective  Patient reports feeling well; not too tired; denies any pain; Denies any new falls;    Patient is accompained by:  Family member    Pertinent History  Patient is a 73 y.o. male who presents to outpatient physical therapy with a referral for medical diagnosis of CVA. This patient's chief complaints consist of left hemiplegia and overall deconditioning leading to the following functional deficits: dependent for ADLs, IADLs, unable to transfer to car, dependent transfers at home with hoyer lift, unable to walk or stand without significant assistance, difficulty with W/C navigation..    Limitations  Sitting;Lifting;Standing;House hold activities;Writing;Walking;Other (comment)    Diagnostic tests  Brain MRI 11/01/2017: IMPRESSION: 1. Acute right ACA territory infarct as demonstrated on prior CT imaging. No intracranial hemorrhage or significant mass effect. 2. 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    Patient Stated Goals  wants to be able to walk again    Currently in Pain?  No/denies         TREATMENT   Therapeutic activity Patient transferred squat pivot wheelchair to mat table, mod A +2 with mod VCs for trunk control, hand placement and positioning; Provided patient with increased time to scoot forward to edge of chair and to shift hips to better position prior to transfer. He required mod A for positioning feet for better position;   At end of session patient transferred squat pivot transfer mat table to wheelchair with mod A +2 with mod VCs for trunk position and hand placement; He requires therapist assist to  block knee for better positioning;   Transitioned sidelying to sitting with min A +1 with cues for LE positioning and UE positioning; Pt slow with bed mobility requiring increased time to initiate movement due to decreased motor planning and cues for sequencing with rolling;  Practiced sitting balance with anterior pelvic tilts, scapular retraction, and upright posture without posterior leaning. Practiced weight shifting left and right as well as forward/backward in sitting. Pt requires repeated cues for proper upright posture and utilized mirror feedback due to continual L lean.  Sit to stand practice x 3 with intermittent use of rolling walker. Once upright attempted to maintain standing posture for longer periods of time as pt is able to tolerate. Pt requires repeated cues for knee and hip extension as well as upright posture once standing. Therapist occasionally blocking L knee and assisting L knee into extension. During first standing attempt pt able to perform standing mini squats with L TKE x 5;   Estim Attended Hooklying with bolster under legs: PT applied NMES to L quad muscle, large muscle group atrophy setting, 10sec on, 10 sec off x 10 min at tolerated intensity (#50) with mod VCs to increase quad muscle activation during NMES activation; Patient able to initiate quad activation but does require AAROM to achieve full SAQ ROM despite NMES assistance. Strength does appear to be improving from prior sessions with improved ability to initiate motion and hold foot off mat table. He denies any pain or discomfort. He becomes progressively more distracted as session continues.   Ther-ex  Instructed patient in LE strengthening Patient hooklying: Hooklying clams with manual resistance x 10, extensive cues and redirection; Hooklying adductor squeezes with gentle manual resistance x 10; Bridges with knees resting on bolster and tactile cues for L glut activation, arms by side x  10reps; Hooklying marches alternating x 10 each; Supine SLR abduction with assist from therapist and extensive verbal/tactile cues x 10;   Pt educated throughout session about proper posture and technique with exercises. Improved exercise technique, movement at target joints, use of target muscles after min to mod verbal, visual, tactile cues.    Pt is continuing to make good progress with therapy. He demonstrates improved sitting posture however does continually lean to the left. Improved ability to self correct with use of mirror feedback. Sit to stand transfer sequencing and strength is improving and pt is even able to perform some limited mini squats in standing during one of his stands. Improving L quad activation during estim SAQ.He is still unsafe toattempt any form ofambulation.Pt will benefit from PT services to address deficits in strength, balance, and mobility in order to return to full function at home.  PT Short Term Goals - 08/05/18 1326      PT SHORT TERM GOAL #1   Title  Be independent with initial home exercise program for self-management of symptoms.    Baseline  HEP to be given at second session; advice to practice unsupported sitting at home (06/19/2018); 07/08/18: Performing at home, 6/29: needs assistance but is doing exercise program regularly;    Time  2    Period  Weeks    Status  On-going    Target Date  06/26/18        PT Long Term Goals - 07/30/18 1429      PT LONG TERM GOAL #1   Title  Patient will complete all bed mobility with min A to improve functional independence for getting in and out of bed and adjusting in bed.     Baseline  max A - total A reported by family (06/19/2018); 07/08/18: still requires maxA, dtr reports improvement in rolling since starting therapy; 6/23: min A to supervision in clinic; not doing at home right    Time  12    Period  Weeks    Status  On-going    Target Date  09/11/18       PT LONG TERM GOAL #2   Title  Patient will complete sit <> stand transfer chair to chair with Min A and LRAD to improve functional independence for household and community mobility.     Baseline  max A +2 STS at W/C (06/12/2018); max A +2-3 STS or stand pivot transfer, able to complete more reps (06/19/2018); 07/08/18: not safe to attempt at this time    Time  12    Period  Weeks    Status  On-going    Target Date  09/11/18      PT LONG TERM GOAL #3   Title  Patient will navigate manual w/c with min A x 100 feet to improve mobility for household and short community distances.     Baseline  required total A (06/12/2018); able to roll 20 feet with min A (06/20/2018); 07/08/18: Pt can go approximately 10' without assist per family;    Time  12    Period  Weeks    Status  On-going    Target Date  09/11/18      PT LONG TERM GOAL #4   Title  Patient will complete car transfers W/C to family SUV with min A using LRAD to improve his abilty to participate in community activities.     Baseline  unable to complete car transfers at all (06/12/2018); unable to complete car transfers at all (06/19/2018).; 07/08/18: Unable to perform at this time    Time  12    Period  Weeks    Status  On-going    Target Date  09/11/18      PT LONG TERM GOAL #5   Title  Patient will ambulate at least 150 feet with mod I using LRAD to improve mobility for household and community distances.     Baseline  unable to take steps (06/12/2018); able to shift R leg forward and back with max A and RW at edge of plinth (06/19/2018); 07/08/18: unable to ambulate at this time.     Time  12    Period  Weeks    Status  On-going    Target Date  09/11/18      Additional Long Term Goals   Additional Long Term Goals  Yes  PT LONG TERM GOAL #6   Title  Patient will navigate 4 steps with min A and BUE support to improve his ability to access community and social participation.     Baseline  unable to complet stairs (06/12/2018); unable to complete  stairs (06/19/2018); 07/08/18: unable to attempt at this time    Time  12    Period  Weeks    Status  On-going    Target Date  09/11/18      PT LONG TERM GOAL #7   Title  Patient will increase functional reach test to >15 inches in sitting to exhibit improved sitting balance and positioning and reduce fall risk;    Baseline  07/30/18: 12 inches    Time  4    Period  Weeks    Status  New    Target Date  09/11/18            Plan - 08/06/18 0820    Clinical Impression Statement  Pt is continuing to make good progress with therapy. He demonstrates improved sitting posture however does continually lean to the left. Improved ability to self correct with use of mirror feedback. Sit to stand transfer sequencing and strength is improving and pt is even able to perform some limited mini squats in standing during one of his stands. Improving L quad activation during estim SAQ. He is still unsafe to attempt any form of ambulation. Pt will benefit from PT services to address deficits in strength, balance, and mobility in order to return to full function at home.    Comorbidities  Relevant past medical history and comorbidities include long term steroid use for lupus, CKD, chronic osteomyelitis, cervical spine stenosis, BPH, APS, alcohol abuse, peripheral artery disease, depression, GERD, obstructive sleep apnea, HTN, IBS, IgA deficiency, lumbar radiculopathy, RA, Stroke, toxic maculopathy in both eyes, systemic lupus erythematosus related syndrome, cardiac catheterization, carotid endarterectomy, cervical laminectomy, coronary angioplasty, Fusion C5-C7, knee arthroplasty, lumbar surgery.    Examination-Activity Limitations  Bed Mobility;Bend;Sit;Toileting;Stand;Stairs;Lift;Transfers;Squat;Locomotion Level;Carry;Dressing;Hygiene/Grooming;Continence    Examination-Participation Restrictions  Yard Work;Interpersonal Relationship;Community Activity    Stability/Clinical Decision Making  Evolving/Moderate  complexity    Rehab Potential  Fair    PT Frequency  3x / week    PT Duration  12 weeks    PT Treatment/Interventions  ADLs/Self Care Home Management;Electrical Stimulation;Moist Heat;Cryotherapy;Gait training;Stair training;Functional mobility training;Therapeutic exercise;Balance training;Neuromuscular re-education;Cognitive remediation;Patient/family education;Orthotic Fit/Training;Wheelchair mobility training;Manual techniques;Passive range of motion;Energy conservation;Joint Manipulations    PT Next Visit Plan  Work on strength, sitting/standing balance, functional activities such as rolling, bed mobility, transfers    PT Home Exercise Plan  No updates today    Consulted and Agree with Plan of Care  Patient;Family member/caregiver    Family Member Consulted  daugher [name]       Patient will benefit from skilled therapeutic intervention in order to improve the following deficits and impairments:  Abnormal gait, Decreased activity tolerance, Decreased cognition, Decreased endurance, Decreased knowledge of use of DME, Decreased range of motion, Decreased skin integrity, Decreased strength, Impaired perceived functional ability, Impaired sensation, Impaired UE functional use, Improper body mechanics, Pain, Cardiopulmonary status limiting activity, Decreased balance, Decreased coordination, Decreased mobility, Difficulty walking, Impaired tone, Postural dysfunction  Visit Diagnosis: 1. Muscle weakness (generalized)   2. Difficulty in walking, not elsewhere classified   3. Other lack of coordination        Problem List There are no active problems to display for this patient.  Lyndel Safe Davion Meara PT, DPT, GCS  Mariany Mackintosh 08/06/2018, 8:29 AM  [place] MAIN [facility] SERVICES [address] [place], [place], [address] Phone: [phone]   Fax:  [phone]  Name: Kenneth Summers MRN: [id number] Date of Birth: [birth year]

## 2018-08-08 ENCOUNTER — Other Ambulatory Visit: Payer: Self-pay

## 2018-08-08 ENCOUNTER — Encounter: Payer: Self-pay | Admitting: Occupational Therapy

## 2018-08-08 ENCOUNTER — Ambulatory Visit: Payer: Medicare Other | Attending: Family Medicine | Admitting: Occupational Therapy

## 2018-08-08 ENCOUNTER — Encounter: Payer: Self-pay | Admitting: Physical Therapy

## 2018-08-08 ENCOUNTER — Ambulatory Visit: Payer: Medicare Other | Admitting: Physical Therapy

## 2018-08-08 DIAGNOSIS — R296 Repeated falls: Secondary | ICD-10-CM | POA: Insufficient documentation

## 2018-08-08 DIAGNOSIS — R262 Difficulty in walking, not elsewhere classified: Secondary | ICD-10-CM | POA: Diagnosis present

## 2018-08-08 DIAGNOSIS — M6281 Muscle weakness (generalized): Secondary | ICD-10-CM | POA: Diagnosis not present

## 2018-08-08 DIAGNOSIS — R278 Other lack of coordination: Secondary | ICD-10-CM | POA: Diagnosis present

## 2018-08-08 DIAGNOSIS — H547 Unspecified visual loss: Secondary | ICD-10-CM | POA: Diagnosis present

## 2018-08-08 DIAGNOSIS — R41841 Cognitive communication deficit: Secondary | ICD-10-CM | POA: Insufficient documentation

## 2018-08-08 NOTE — Therapy (Signed)
[place] MAIN [facility] SERVICES [address] [place], [place], [address] Phone: [phone]   Fax:  [phone]  Physical Therapy Treatment  Patient Details  Name: Kenneth Summers MRN: [id number] Date of Birth: 1945-08-12 Referring Provider (PT): Dr. [name], Guy Begin   Encounter Date: 08/08/2018  PT End of Session - 08/08/18 1540    Visit Number  24    Number of Visits  36    Date for PT Re-Evaluation  09/11/18    Authorization Type  Medicare reporting period from 06/12/2018    Authorization Time Period  Current cert period 5/36/1443 - 09/11/2018    Authorization - Visit Number  6    Authorization - Number of Visits  10    PT Start Time  1430    PT Stop Time  1515    PT Time Calculation (min)  45 min    Equipment Utilized During Treatment  Gait belt    Activity Tolerance  Patient limited by fatigue    Behavior During Therapy  [facility] for tasks assessed/performed       Past Medical History:  Diagnosis Date  . Alcohol abuse   . APS (antiphospholipid syndrome) ([place])   . BPH (benign prostatic hyperplasia)   . CAD (coronary artery disease)   . Cellulitis   . Cervical spinal stenosis   . Chronic osteomyelitis ([place])   . CKD (chronic kidney disease)   . CKD (chronic kidney disease)   . Clostridium difficile diarrhea   . Depression   . Diplopia   . Fatigue   . GERD (gastroesophageal reflux disease)   . Hyperlipemia   . Hypertension   . Hypovitaminosis D   . IBS (irritable bowel syndrome)   . IgA deficiency ([place])   . Insomnia   . Left lumbosacral radiculopathy   . Moderate obstructive sleep apnea   . Osteomyelitis of foot ([place])   . PAD (peripheral artery disease) ([place])   . Pruritus   . RA (rheumatoid arthritis) ([place])   . Radiculopathy   . Restless legs syndrome   . RLS (restless legs syndrome)   . SLE (systemic lupus erythematosus related syndrome) ([place])   . Stroke ([place])   . Toxic maculopathy of both eyes     Past Surgical  History:  Procedure Laterality Date  . CARDIAC CATHETERIZATION    . CAROTID ENDARTERECTOMY    . CERVICAL LAMINECTOMY    . CORONARY ANGIOPLASTY    . FRACTURE SURGERY    . fusion C5-6-7    . HERNIA REPAIR    . KNEE ARTHROSCOPY    . TONSILLECTOMY      There were no vitals filed for this visit.  Subjective Assessment - 08/08/18 1538    Subjective  Pt and his daughter report that he is very tired today due to forgetting to take his "restless leg" pill last night before bed, so he did not sleep much at all.    Patient is accompained by:  Family member    Pertinent History  Patient is a 73 y.o. male who presents to outpatient physical therapy with a referral for medical diagnosis of CVA. This patient's chief complaints consist of left hemiplegia and overall deconditioning leading to the following functional deficits: dependent for ADLs, IADLs, unable to transfer to car, dependent transfers at home with hoyer lift, unable to walk or stand without significant assistance, difficulty with W/C navigation..    Limitations  Sitting;Lifting;Standing;House hold activities;Writing;Walking;Other (comment)    Diagnostic tests  Brain MRI 11/01/2017: IMPRESSION: 1. Acute right ACA territory infarct as demonstrated on prior CT imaging. No intracranial hemorrhage or significant mass effect. 2. 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    Patient Stated Goals  wants to be able to walk again    Currently in Pain?  No/denies    Multiple Pain Sites  No         Treatment:    Therapeutic Activities:  Transfer Training- sliding board transfer from w/c to mat with mod-max A x2; stand pivot transfer from mat to w/c with max A x 2.  Heavy verbal cueing for proper technique with both transfers.  Therapeutic Exercise:  Glut sets 2x10; bridging 2x10; heel slides 2x10 with heavy assist with L LE; SAQ 2x10; SL clams 2x10 B; hooklying ball hip add squeezes 2x10; hooklying alt marching  with heavy assist of L LE 2x10.                      PT Education - 08/08/18 1539    Education Details  transfers/strengthening, HEP reinforced    Person(s) Educated  Patient;Child(ren)    Methods  Explanation;Verbal cues    Comprehension  Verbalized understanding;Returned demonstration;Verbal cues required;Need further instruction       PT Short Term Goals - 08/05/18 1326      PT SHORT TERM GOAL #1   Title  Be independent with initial home exercise program for self-management of symptoms.    Baseline  HEP to be given at second session; advice to practice unsupported sitting at home (06/19/2018); 07/08/18: Performing at home, 6/29: needs assistance but is doing exercise program regularly;    Time  2    Period  Weeks    Status  On-going    Target Date  06/26/18        PT Long Term Goals - 07/30/18 1429      PT LONG TERM GOAL #1   Title  Patient will complete all bed mobility with min A to improve functional independence for getting in and out of bed and adjusting in bed.     Baseline  max A - total A reported by family (06/19/2018); 07/08/18: still requires maxA, dtr reports improvement in rolling since starting therapy; 6/23: min A to supervision in clinic; not doing at home right    Time  12    Period  Weeks    Status  On-going    Target Date  09/11/18      PT LONG TERM GOAL #2   Title  Patient will complete sit <> stand transfer chair to chair with Min A and LRAD to improve functional independence for household and community mobility.     Baseline  max A +2 STS at W/C (06/12/2018); max A +2-3 STS or stand pivot transfer, able to complete more reps (06/19/2018); 07/08/18: not safe to attempt at this time    Time  12    Period  Weeks    Status  On-going    Target Date  09/11/18      PT LONG TERM GOAL #3   Title  Patient will navigate manual w/c with min A x 100 feet to improve mobility for household and short community distances.     Baseline  required total A  (06/12/2018); able to roll 20 feet with min A (06/20/2018); 07/08/18: Pt can go approximately 10' without assist per family;    Time  16  Period  Weeks    Status  On-going    Target Date  09/11/18      PT LONG TERM GOAL #4   Title  Patient will complete car transfers W/C to family SUV with min A using LRAD to improve his abilty to participate in community activities.     Baseline  unable to complete car transfers at all (06/12/2018); unable to complete car transfers at all (06/19/2018).; 07/08/18: Unable to perform at this time    Time  12    Period  Weeks    Status  On-going    Target Date  09/11/18      PT LONG TERM GOAL #5   Title  Patient will ambulate at least 150 feet with mod I using LRAD to improve mobility for household and community distances.     Baseline  unable to take steps (06/12/2018); able to shift R leg forward and back with max A and RW at edge of plinth (06/19/2018); 07/08/18: unable to ambulate at this time.     Time  12    Period  Weeks    Status  On-going    Target Date  09/11/18      Additional Long Term Goals   Additional Long Term Goals  Yes      PT LONG TERM GOAL #6   Title  Patient will navigate 4 steps with min A and BUE support to improve his ability to access community and social participation.     Baseline  unable to complet stairs (06/12/2018); unable to complete stairs (06/19/2018); 07/08/18: unable to attempt at this time    Time  12    Period  Weeks    Status  On-going    Target Date  09/11/18      PT LONG TERM GOAL #7   Title  Patient will increase functional reach test to >15 inches in sitting to exhibit improved sitting balance and positioning and reduce fall risk;    Baseline  07/30/18: 12 inches    Time  4    Period  Weeks    Status  New    Target Date  09/11/18            Plan - 08/08/18 1541    Clinical Impression Statement  Pt able to perform one stand-pivot transfer from mat to w/c today with max A x 2.  He asked to use sliding board for one  transfer from w/c to mat today due to high level of fatigue (mod-max Ax2 with sliding board).  Pt needed heavy verbal and manual cueing to stay on task today with ther ex's.    Personal Factors and Comorbidities  Age;Comorbidity 3+    Comorbidities  Relevant past medical history and comorbidities include long term steroid use for lupus, CKD, chronic osteomyelitis, cervical spine stenosis, BPH, APS, alcohol abuse, peripheral artery disease, depression, GERD, obstructive sleep apnea, HTN, IBS, IgA deficiency, lumbar radiculopathy, RA, Stroke, toxic maculopathy in both eyes, systemic lupus erythematosus related syndrome, cardiac catheterization, carotid endarterectomy, cervical laminectomy, coronary angioplasty, Fusion C5-C7, knee arthroplasty, lumbar surgery.    Examination-Activity Limitations  Bed Mobility;Bend;Sit;Toileting;Stand;Stairs;Lift;Transfers;Squat;Locomotion Level;Carry;Dressing;Hygiene/Grooming;Continence    Examination-Participation Restrictions  Yard Work;Interpersonal Relationship;Community Activity    Stability/Clinical Decision Making  Evolving/Moderate complexity    Clinical Decision Making  Moderate    Rehab Potential  Fair    PT Frequency  3x / week    PT Duration  12 weeks    PT Treatment/Interventions  ADLs/Self Care  Home Management;Electrical Stimulation;Moist Heat;Cryotherapy;Gait training;Stair training;Functional mobility training;Therapeutic exercise;Balance training;Neuromuscular re-education;Cognitive remediation;Patient/family education;Orthotic Fit/Training;Wheelchair mobility training;Manual techniques;Passive range of motion;Energy conservation;Joint Manipulations    PT Next Visit Plan  Work on strength, sitting/standing balance, functional activities such as rolling, bed mobility, transfers    PT Home Exercise Plan  No updates today    Consulted and Agree with Plan of Care  Patient;Family member/caregiver    Family Member Consulted  daugher [name]       Patient will  benefit from skilled therapeutic intervention in order to improve the following deficits and impairments:  Abnormal gait, Decreased activity tolerance, Decreased cognition, Decreased endurance, Decreased knowledge of use of DME, Decreased range of motion, Decreased skin integrity, Decreased strength, Impaired perceived functional ability, Impaired sensation, Impaired UE functional use, Improper body mechanics, Pain, Cardiopulmonary status limiting activity, Decreased balance, Decreased coordination, Decreased mobility, Difficulty walking, Impaired tone, Postural dysfunction  Visit Diagnosis: 1. Muscle weakness (generalized)   2. Other lack of coordination   3. Difficulty in walking, not elsewhere classified        Problem List There are no active problems to display for this patient.   Quenna Doepke, MPT 08/08/2018, 3:46 PM  [place] MAIN [facility] SERVICES [address] [place], [place], [address] Phone: [phone]   Fax:  [phone]  Name: Kenneth Summers MRN: [id number] Date of Birth: 02/17/1945

## 2018-08-08 NOTE — Therapy (Signed)
[place] MAIN [facility] SERVICES [address] [place], [place], [address] Phone: [phone]   Fax:  [phone]  Occupational Therapy Treatment  Patient Details  Name: Kenneth Summers MRN: [id number] Date of Birth: February 27, 1945 No data recorded  Encounter Date: 08/08/2018  OT End of Session - 08/08/18 1524    Visit Number  22    Number of Visits  24    Date for OT Re-Evaluation  09/04/18    OT Start Time  1340    OT Stop Time  1415    OT Time Calculation (min)  35 min    Activity Tolerance  Patient tolerated treatment well    Behavior During Therapy  [facility] for tasks assessed/performed       Past Medical History:  Diagnosis Date  . Alcohol abuse   . APS (antiphospholipid syndrome) ([place])   . BPH (benign prostatic hyperplasia)   . CAD (coronary artery disease)   . Cellulitis   . Cervical spinal stenosis   . Chronic osteomyelitis ([place])   . CKD (chronic kidney disease)   . CKD (chronic kidney disease)   . Clostridium difficile diarrhea   . Depression   . Diplopia   . Fatigue   . GERD (gastroesophageal reflux disease)   . Hyperlipemia   . Hypertension   . Hypovitaminosis D   . IBS (irritable bowel syndrome)   . 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 Insomnia   . Left lumbosacral radiculopathy   . Moderate obstructive sleep apnea   . Osteomyelitis of foot ([place])   . PAD (peripheral artery disease) ([place])   . Pruritus   . RA (rheumatoid arthritis) ([place])   . Radiculopathy   . Restless legs syndrome   . RLS (restless legs syndrome)   . SLE (systemic lupus erythematosus related syndrome) ([place])   . Stroke ([place])   . Toxic maculopathy of both eyes     Past Surgical History:  Procedure Laterality Date  . CARDIAC CATHETERIZATION    . CAROTID ENDARTERECTOMY    . CERVICAL LAMINECTOMY    . CORONARY ANGIOPLASTY    . FRACTURE SURGERY    . fusion C5-6-7    . HERNIA REPAIR    . KNEE ARTHROSCOPY    . TONSILLECTOMY      There were no vitals filed for  this visit.  Subjective Assessment - 08/08/18 1522    Subjective   Pt. reports feeling very tired today    Pertinent History  Pt. is a 73 y.o. 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 Pt. PMHx includes: Multiple Falls, Lupus, DJD, Renal Abscess, CVA. Pt. resides with his wife. Pt.'s wife and daughter assist with ADLs. Pt. has caregivers in  for 2 hours a day, 6 days a week. Pt. received Rehab services in acute care, at SNF for STR, and [place] services. Pt. is retired from [employer] for [employer] and [employer].    Patient Stated Goals  To regain use of his left UE, and do more for himself.    Currently in Pain?  No/denies      OT TREATMENT    Neuro muscular re-education:  Pt. worked on grasping 1" resistive cubes alternating thumb opposition to the tip of the 2nd while the board is placed at a vertical angle. Pt. worked on pressing the cubes back into place while alternating isolated 2nd through 4th digit extension. Pt. Required cues   Therapeutic Exercise:  Pt.  performed left hand gross gripping with grip strengthener. Pt. worked on sustaining grip while grasping pegs and reaching at various heights. Gripper was placed in the 3rd resistive slot with the white resistive spring. The gripper was modified, and changed to the 2nd resistive slot. Pt. Worked on pinch strengthening in the left hand for lateral, and 3pt. pinch using yellow, red, green, and blue resistive clips. Pt. worked on placing the clips at various vertical and horizontal angles. Tactile and verbal cues were required for eliciting the desired movement.                             OT Education - 08/08/18 1523    Education Details  Left hand function    Person(s) Educated  Patient    Methods  Explanation;Demonstration    Comprehension  Verbalized understanding;Returned demonstration          OT Long Term Goals - 08/05/18 1727      OT LONG TERM GOAL #1    Title  Pt. will increase left shoulder flexion AROM by 10 degrees to access cabinet/shelf.    Baseline  AROM left shoulder flexion: 104    Time  12    Period  Weeks    Status  On-going    Target Date  09/04/18      OT LONG TERM GOAL #2   Title  Pt. will donn a shirt with Supervision.    Baseline  Min-ModA    Time  12    Period  Weeks    Status  On-going    Target Date  09/04/18      OT LONG TERM GOAL #3   Title  Pt. will require ModA to perform LE dressing    Baseline  MaxA left, ModA with the right    Time  12    Period  Weeks    Status  On-going    Target Date  09/04/18      OT LONG TERM GOAL #4   Title  Pt. will improve left grip strength by 10# to be able to open a jar/container.    Baseline  Left: 29, Right 40    Time  12    Status  On-going    Target Date  09/04/18      OT LONG TERM GOAL #5   Title  Pt. will improve left hand [facility] skills to be able to assit with buttoning/zipping.    Baseline  Left: 1 min & 52 sec. R: 56 sec.    Time  12    Period  Weeks    Status  On-going    Target Date  09/04/18      OT LONG TERM GOAL #6   Title  Pt. will demonstrate visual conmpensatory strategies during 100% of the time during self care.    Baseline  Continue    Time  12    Period  Weeks    Status  On-going    Target Date  09/04/18      OT LONG TERM GOAL #7   Title  Pt. will prepare a simple cold snack from w/c with supervision using cognitive compensatory strategies 100% of the time.    Baseline  Pt. is unable    Time  12    Period  Weeks    Status  On-going    Target Date  09/04/18      OT LONG TERM GOAL #  8   Title  Pt. will accurately identify potential safety hazard using good safety awareness, and judgement 100% for ADLs, and IADLs.    Baseline  Continue    Time  12    Period  Weeks    Status  On-going    Target Date  09/04/18            Plan - 08/08/18 1524    Clinical Impression Statement  Pt. was late for the session secondary to transportation  issues. Pt. reported being tired, and not sleeping well last night. Pt. required increased cuing for attention to task, as well as verbal, and tactile cues for upright midline positioning secondary to leaning to the right. Pt.continues to present with limited LUE strength, motor control, and [facility] skills. Pt. continues to work on improving ADLs left hand function skills in order to increase engagement in ADLs, and IADL tasks. Pt. continues to work on improving overall engagement in ADL, and IADL functioning in order to promote increased independence, and decrease caregiver burden.    Occupational performance deficits (Please refer to evaluation for details):  ADL's    Body Structure / Function / Physical Skills  UE functional use;Coordination;FMC;Dexterity;Strength;ROM    Cognitive Skills  Attention;Memory;Emotional;Problem Solve;Safety Awareness    Rehab Potential  Good    Clinical Decision Making  Multiple treatment options, significant modification of task necessary    Comorbidities Affecting Occupational Performance:  Presence of comorbidities impacting occupational performance    OT Frequency  3x / week    OT Duration  12 weeks    OT Treatment/Interventions  Self-care/ADL training;DME and/or AE instruction;Therapeutic exercise;Therapeutic activities;Moist Heat;Cognitive remediation/compensation;Neuromuscular education;Visual/perceptual remediation/compensation;Coping strategies training;Patient/family education;Passive range of motion;Psychosocial skills training;Energy conservation;Functional Mobility Training    Recommended Other Services  PT    Consulted and Agree with Plan of Care  Patient;Family member/caregiver    Family Member Consulted  Daughter [name]       Patient will benefit from skilled therapeutic intervention in order to improve the following deficits and impairments:   Body Structure / Function / Physical Skills: UE functional use, Coordination, FMC, Dexterity, Strength,  ROM Cognitive Skills: Attention, Memory, Emotional, Problem Solve, Safety Awareness     Visit Diagnosis: 1. Muscle weakness (generalized)   2. Other lack of coordination       Problem List There are no active problems to display for this patient.   Harrel Carina, MS, OTR/L 08/08/2018, 4:30 PM  [place] MAIN [facility] SERVICES [address] [place], [place], [address] Phone: [phone]   Fax:  [phone]  Name: Kenneth Summers MRN: [id number] Date of Birth: 16-Sep-1945

## 2018-08-12 ENCOUNTER — Ambulatory Visit: Payer: Medicare Other | Admitting: Occupational Therapy

## 2018-08-12 ENCOUNTER — Other Ambulatory Visit: Payer: Self-pay

## 2018-08-12 ENCOUNTER — Ambulatory Visit: Payer: Medicare Other | Admitting: Physical Therapy

## 2018-08-12 ENCOUNTER — Encounter: Payer: Self-pay | Admitting: Occupational Therapy

## 2018-08-12 ENCOUNTER — Encounter: Payer: Self-pay | Admitting: Physical Therapy

## 2018-08-12 DIAGNOSIS — R296 Repeated falls: Secondary | ICD-10-CM

## 2018-08-12 DIAGNOSIS — R278 Other lack of coordination: Secondary | ICD-10-CM

## 2018-08-12 DIAGNOSIS — M6281 Muscle weakness (generalized): Secondary | ICD-10-CM

## 2018-08-12 DIAGNOSIS — R262 Difficulty in walking, not elsewhere classified: Secondary | ICD-10-CM

## 2018-08-12 NOTE — Therapy (Signed)
[place] MAIN [facility] SERVICES [address] [place], [place], [address] Phone: [phone]   Fax:  [phone]  Occupational Therapy Treatment  Patient Details  Name: Kenneth Summers MRN: [id number] Date of Birth: 03-30-1945 No data recorded  Encounter Date: 08/12/2018  OT End of Session - 08/12/18 1658    Visit Number  23    Number of Visits  48    Date for OT Re-Evaluation  09/04/18    OT Start Time  1430    OT Stop Time  1515    OT Time Calculation (min)  45 min    Activity Tolerance  Patient tolerated treatment well    Behavior During Therapy  [facility] for tasks assessed/performed       Past Medical History:  Diagnosis Date  . Alcohol abuse   . APS (antiphospholipid syndrome) ([place])   . BPH (benign prostatic hyperplasia)   . CAD (coronary artery disease)   . Cellulitis   . Cervical spinal stenosis   . Chronic osteomyelitis ([place])   . CKD (chronic kidney disease)   . CKD (chronic kidney disease)   . Clostridium difficile diarrhea   . Depression   . Diplopia   . Fatigue   . GERD (gastroesophageal reflux disease)   . Hyperlipemia   . Hypertension   . Hypovitaminosis D   . IBS (irritable bowel syndrome)   . 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 Insomnia   . Left lumbosacral radiculopathy   . Moderate obstructive sleep apnea   . Osteomyelitis of foot ([place])   . PAD (peripheral artery disease) ([place])   . Pruritus   . RA (rheumatoid arthritis) ([place])   . Radiculopathy   . Restless legs syndrome   . RLS (restless legs syndrome)   . SLE (systemic lupus erythematosus related syndrome) ([place])   . Stroke ([place])   . Toxic maculopathy of both eyes     Past Surgical History:  Procedure Laterality Date  . CARDIAC CATHETERIZATION    . CAROTID ENDARTERECTOMY    . CERVICAL LAMINECTOMY    . CORONARY ANGIOPLASTY    . FRACTURE SURGERY    . fusion C5-6-7    . HERNIA REPAIR    . KNEE ARTHROSCOPY    . TONSILLECTOMY      There were no vitals filed for  this visit.  Subjective Assessment - 08/12/18 1657    Subjective   Pt. reports that his restleg syndrome has been bothersome.    Pertinent History  Pt. is a 73 y.o. 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 Pt. PMHx includes: Multiple Falls, Lupus, DJD, Renal Abscess, CVA. Pt. resides with his wife. Pt.'s wife and daughter assist with ADLs. Pt. has caregivers in  for 2 hours a day, 6 days a week. Pt. received Rehab services in acute care, at SNF for STR, and [facility] services. Pt. is retired from [employer] for [employer] and [employer].    Patient Stated Goals  To regain use of his left UE, and do more for himself.    Currently in Pain?  Yes   Not rated. Restless leg syndrome.     OT TREATMENT    Neuro muscular re-education:  Pt. worked on [facility] skills manipulating nuts, and bolts on a bolt board. Pt. worked on screwing, and unscrewing nuts, and bolts of varying sizes, and challenging progressively smaller items. Pt. worked on increasing awareness of the left hand while screwing the  nuts onto the bolts with vision occluded. Pt. required cues to use his left hand, as well as cues, and assist to right self to midline secondary to increased leaning to the left.  Therapeutic Exercise:  Pt. performed gross gripping with grip strengthener. Pt. worked on sustaining grip while grasping pegs and reaching at various heights. The gripper was placed in the 3rd resistive slot with the white resistive spring initially, and was modified to the 2nd resistive slot.                          OT Education - 08/12/18 1658    Education Details  Left hand function    Person(s) Educated  Patient    Methods  Explanation;Demonstration    Comprehension  Verbalized understanding;Returned demonstration          OT Long Term Goals - 08/05/18 1727      OT LONG TERM GOAL #1   Title  Pt. will increase left shoulder flexion AROM by 10 degrees to access  cabinet/shelf.    Baseline  AROM left shoulder flexion: 104    Time  12    Period  Weeks    Status  On-going    Target Date  09/04/18      OT LONG TERM GOAL #2   Title  Pt. will donn a shirt with Supervision.    Baseline  Min-ModA    Time  12    Period  Weeks    Status  On-going    Target Date  09/04/18      OT LONG TERM GOAL #3   Title  Pt. will require ModA to perform LE dressing    Baseline  MaxA left, ModA with the right    Time  12    Period  Weeks    Status  On-going    Target Date  09/04/18      OT LONG TERM GOAL #4   Title  Pt. will improve left grip strength by 10# to be able to open a jar/container.    Baseline  Left: 29, Right 40    Time  12    Status  On-going    Target Date  09/04/18      OT LONG TERM GOAL #5   Title  Pt. will improve left hand [facility] skills to be able to assit with buttoning/zipping.    Baseline  Left: 1 min & 52 sec. R: 56 sec.    Time  12    Period  Weeks    Status  On-going    Target Date  09/04/18      OT LONG TERM GOAL #6   Title  Pt. will demonstrate visual conmpensatory strategies during 100% of the time during self care.    Baseline  Continue    Time  12    Period  Weeks    Status  On-going    Target Date  09/04/18      OT LONG TERM GOAL #7   Title  Pt. will prepare a simple cold snack from w/c with supervision using cognitive compensatory strategies 100% of the time.    Baseline  Pt. is unable    Time  12    Period  Weeks    Status  On-going    Target Date  09/04/18      OT LONG TERM GOAL #8   Title  Pt. will accurately identify potential safety hazard using  good safety awareness, and judgement 100% for ADLs, and IADLs.    Baseline  Continue    Time  12    Period  Weeks    Status  On-going    Target Date  09/04/18            Plan - 08/12/18 1659    Clinical Impression Statement  Pt. has complaints of restless leg syndrome. Pt. reports that it interferes with his sleep. Pt. continues to work on improving Left  sided awareness, attention, LUE strength, and Nelson skills in order to promote, and maximize independence with ADLS, and IADLs.    Occupational performance deficits (Please refer to evaluation for details):  ADL's    Body Structure / Function / Physical Skills  UE functional use;Coordination;FMC;Dexterity;Strength;ROM    Cognitive Skills  Attention;Memory;Emotional;Problem Solve;Safety Awareness    Rehab Potential  Good    Clinical Decision Making  Multiple treatment options, significant modification of task necessary    Comorbidities Affecting Occupational Performance:  Presence of comorbidities impacting occupational performance    Comorbidities impacting occupational performance description:  Phyical, cognitive, visual,  medical comorbidities    Modification or Assistance to Complete Evaluation   Max significant modification of tasks or assist is necessary to complete    OT Frequency  3x / week    OT Duration  12 weeks    OT Treatment/Interventions  Self-care/ADL training;DME and/or AE instruction;Therapeutic exercise;Therapeutic activities;Moist Heat;Cognitive remediation/compensation;Neuromuscular education;Visual/perceptual remediation/compensation;Coping strategies training;Patient/family education;Passive range of motion;Psychosocial skills training;Energy conservation;Functional Mobility Training    Consulted and Agree with Plan of Care  Patient;Family member/caregiver    Family Member Consulted  Daughter [name]       Patient will benefit from skilled therapeutic intervention in order to improve the following deficits and impairments:   Body Structure / Function / Physical Skills: UE functional use, Coordination, FMC, Dexterity, Strength, ROM Cognitive Skills: Attention, Memory, Emotional, Problem Solve, Safety Awareness     Visit Diagnosis: 1. Muscle weakness (generalized)   2. Other lack of coordination       Problem List There are no active problems to display for this  patient.   Harrel Carina, MS, OTR/L 08/12/2018, 5:05 PM  [place] MAIN [facility] SERVICES [address] [place], [place], [address] Phone: [phone]   Fax:  [phone]  Name: Kenneth Summers MRN: [id number] Date of Birth: 1945-09-16

## 2018-08-12 NOTE — Therapy (Signed)
[place] MAIN [facility] SERVICES [address] [place], [place], [address] Phone: [phone]   Fax:  [phone]  Physical Therapy Treatment  Patient Details  Name: Kenneth Summers MRN: [id number] Date of Birth: Apr 12, 1945 Referring Provider (PT): Dr. [name], Guy Begin   Encounter Date: 08/12/2018  PT End of Session - 08/12/18 1421    Visit Number  25    Number of Visits  36    Date for PT Re-Evaluation  09/11/18    Authorization Type  Medicare reporting period from 06/12/2018    Authorization Time Period  Current cert period 6/96/2952 - 09/11/2018    PT Start Time  1302    PT Stop Time  1345    PT Time Calculation (min)  43 min    Equipment Utilized During Treatment  Gait belt    Activity Tolerance  Patient limited by fatigue    Behavior During Therapy  [facility] for tasks assessed/performed       Past Medical History:  Diagnosis Date  . Alcohol abuse   . APS (antiphospholipid syndrome) ([place])   . BPH (benign prostatic hyperplasia)   . CAD (coronary artery disease)   . Cellulitis   . Cervical spinal stenosis   . Chronic osteomyelitis ([place])   . CKD (chronic kidney disease)   . CKD (chronic kidney disease)   . Clostridium difficile diarrhea   . Depression   . Diplopia   . Fatigue   . GERD (gastroesophageal reflux disease)   . Hyperlipemia   . Hypertension   . Hypovitaminosis D   . IBS (irritable bowel syndrome)   . 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 Insomnia   . Left lumbosacral radiculopathy   . Moderate obstructive sleep apnea   . Osteomyelitis of foot ([place])   . PAD (peripheral artery disease) ([place])   . Pruritus   . RA (rheumatoid arthritis) ([place])   . Radiculopathy   . Restless legs syndrome   . RLS (restless legs syndrome)   . SLE (systemic lupus erythematosus related syndrome) ([place])   . Stroke ([place])   . Toxic maculopathy of both eyes     Past Surgical History:  Procedure Laterality Date  . CARDIAC CATHETERIZATION    . CAROTID  ENDARTERECTOMY    . CERVICAL LAMINECTOMY    . CORONARY ANGIOPLASTY    . FRACTURE SURGERY    . fusion C5-6-7    . HERNIA REPAIR    . KNEE ARTHROSCOPY    . TONSILLECTOMY      There were no vitals filed for this visit.  Subjective Assessment - 08/12/18 1301    Subjective  Pt reports doing well; Pt reports a mild ache in left leg; Denies any new falls; Reports minimal fatigue;    Patient is accompained by:  Family member    Pertinent History  Patient is a 73 y.o. male who presents to outpatient physical therapy with a referral for medical diagnosis of CVA. This patient's chief complaints consist of left hemiplegia and overall deconditioning leading to the following functional deficits: dependent for ADLs, IADLs, unable to transfer to car, dependent transfers at home with hoyer lift, unable to walk or stand without significant assistance, difficulty with W/C navigation..    Limitations  Sitting;Lifting;Standing;House hold activities;Writing;Walking;Other (comment)    Diagnostic tests  Brain MRI 11/01/2017: IMPRESSION: 1. Acute right ACA territory infarct as demonstrated on prior CT imaging. No intracranial hemorrhage or significant mass effect. 2. 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    Patient Stated Goals  wants to be able to walk again    Currently in Pain?  Yes    Pain Score  1     Pain Location  Leg    Pain Orientation  Left    Pain Descriptors / Indicators  Aching;Sore    Pain Type  Acute pain    Pain Onset  Today    Pain Frequency  Intermittent    Aggravating Factors   worse with prolonged sitting;    Pain Relieving Factors  exercise/activity;    Effect of Pain on Daily Activities  decreased activity tolerance;    Multiple Pain Sites  No            TREATMENT: Therapeutic activity: Patient transferred squatpivot wheelchair to mat table, min A +2 with mod VCs for trunk control, hand placement and positioning; Provided patient with  increased time to scoot forward to edge of chair and to shift hips to better position prior to transfer. He required mod A for positioning feet for better position;   Sit<>stand transfer: With RW x3 reps with mod A +2 progressing to min A +2; patient able to hold standing position for 60 sec, 90 sec and 2.64min each with mod VCs to facilitate erect posture, improve hip/knee extension; he does stand with mild lateral lean with increased left knee flexion but is able to self correct with improve extension activation with verbal cues; Patient able to stand with RW with min a +1 to keep balance;  At end of session patientinstructed in squat pivot transfer mat table to wheelchair.  He requiredmod A +2 with mod VCs for trunk position and hand placement; He requires therapist assist to block leftknee for better positioning;   Instructed patient in bed mobility: MinA for sit to sidelying with therapist cues to cross right foot under left foot and to use right foot to assist lifting left leg onto mat table; Patient then rolled hooklying, min A +1  Rolling to right side,patient required min A for initiating roll with LLE with cues for lumbar trunk rotation to utilize momentum for better rotation;  Transitioned sidelying to sitting with supervision requiring cues for LE positioning and UE positioning; Pt slow with bed mobility requiring increased time to initiate movement due to decreased motor planning;  Exercise: Instructed patient in LE strengthening and flexibility exercise: Patient hooklying: -bridges with arms across chestx15reps; pt able to hold legs into neutral position; instructed patient in posterior pelvic rock prior to hip extension for better gluteal activation; -LLE Heel slides x10 AROM with cues to increase ROM to tolerance; -SAQ x10 bilaterally with partial ROM on LLE requiring AAROM to achieve full ROM; -LLE SLR hip flexion AAROM x10 reps with min VCs to increase quad set  prior to hip flexion for better knee control; patient able to exhibit good quad set activation;    Response to treatment: Patient tolerated sessionwell; He was able to progress with bed mobility and transfers requiring less assistance; He does require increased time for motor planning. Patient fatigues with advanced exercise and mobility; He denies any increase in back pain with advanced exercise or with prolonged standing;                           PT Education - 08/12/18 1421    Education Details  LE strengthening, transfers;    Person(s) Educated  Patient;Child(ren)    Methods  Explanation;Verbal cues    Comprehension  Verbalized understanding;Returned demonstration;Verbal cues required;Need further instruction       PT Short Term Goals - 08/05/18 1326      PT SHORT TERM GOAL #1   Title  Be independent with initial home exercise program for self-management of symptoms.    Baseline  HEP to be given at second session; advice to practice unsupported sitting at home (06/19/2018); 07/08/18: Performing at home, 6/29: needs assistance but is doing exercise program regularly;    Time  2    Period  Weeks    Status  On-going    Target Date  06/26/18        PT Long Term Goals - 07/30/18 1429      PT LONG TERM GOAL #1   Title  Patient will complete all bed mobility with min A to improve functional independence for getting in and out of bed and adjusting in bed.     Baseline  max A - total A reported by family (06/19/2018); 07/08/18: still requires maxA, dtr reports improvement in rolling since starting therapy; 6/23: min A to supervision in clinic; not doing at home right    Time  12    Period  Weeks    Status  On-going    Target Date  09/11/18      PT LONG TERM GOAL #2   Title  Patient will complete sit <> stand transfer chair to chair with Min A and LRAD to improve functional independence for household and community mobility.     Baseline  max A +2 STS at W/C  (06/12/2018); max A +2-3 STS or stand pivot transfer, able to complete more reps (06/19/2018); 07/08/18: not safe to attempt at this time    Time  12    Period  Weeks    Status  On-going    Target Date  09/11/18      PT LONG TERM GOAL #3   Title  Patient will navigate manual w/c with min A x 100 feet to improve mobility for household and short community distances.     Baseline  required total A (06/12/2018); able to roll 20 feet with min A (06/20/2018); 07/08/18: Pt can go approximately 10' without assist per family;    Time  12    Period  Weeks    Status  On-going    Target Date  09/11/18      PT LONG TERM GOAL #4   Title  Patient will complete car transfers W/C to family SUV with min A using LRAD to improve his abilty to participate in community activities.     Baseline  unable to complete car transfers at all (06/12/2018); unable to complete car transfers at all (06/19/2018).; 07/08/18: Unable to perform at this time    Time  12    Period  Weeks    Status  On-going    Target Date  09/11/18      PT LONG TERM GOAL #5   Title  Patient will ambulate at least 150 feet with mod I using LRAD to improve mobility for household and community distances.     Baseline  unable to take steps (06/12/2018); able to shift R leg forward and back with max A and RW at edge of plinth (06/19/2018); 07/08/18: unable to ambulate at this time.     Time  12    Period  Weeks    Status  On-going    Target Date  09/11/18  Additional Long Term Goals   Additional Long Term Goals  Yes      PT LONG TERM GOAL #6   Title  Patient will navigate 4 steps with min A and BUE support to improve his ability to access community and social participation.     Baseline  unable to complet stairs (06/12/2018); unable to complete stairs (06/19/2018); 07/08/18: unable to attempt at this time    Time  12    Period  Weeks    Status  On-going    Target Date  09/11/18      PT LONG TERM GOAL #7   Title  Patient will increase functional reach test  to >15 inches in sitting to exhibit improved sitting balance and positioning and reduce fall risk;    Baseline  07/30/18: 12 inches    Time  4    Period  Weeks    Status  New    Target Date  09/11/18            Plan - 08/12/18 1422    Clinical Impression Statement  Patient tolerated session well. Initially he seemed more depressed and flat affect but within session appears to be more motivated as we worked on functional tasks. Patient able to progress standing to 2.5 min with RW with min A +2 for safety; He does require frequent cues for correct standing posture incuding hip/trunk extension; Patient would benefit from additional skilled PT intervention to improve strength, balance and mobility;    Personal Factors and Comorbidities  Age;Comorbidity 3+    Comorbidities  Relevant past medical history and comorbidities include long term steroid use for lupus, CKD, chronic osteomyelitis, cervical spine stenosis, BPH, APS, alcohol abuse, peripheral artery disease, depression, GERD, obstructive sleep apnea, HTN, IBS, IgA deficiency, lumbar radiculopathy, RA, Stroke, toxic maculopathy in both eyes, systemic lupus erythematosus related syndrome, cardiac catheterization, carotid endarterectomy, cervical laminectomy, coronary angioplasty, Fusion C5-C7, knee arthroplasty, lumbar surgery.    Examination-Activity Limitations  Bed Mobility;Bend;Sit;Toileting;Stand;Stairs;Lift;Transfers;Squat;Locomotion Level;Carry;Dressing;Hygiene/Grooming;Continence    Examination-Participation Restrictions  Yard Work;Interpersonal Relationship;Community Activity    Stability/Clinical Decision Making  Evolving/Moderate complexity    Rehab Potential  Fair    PT Frequency  3x / week    PT Duration  12 weeks    PT Treatment/Interventions  ADLs/Self Care Home Management;Electrical Stimulation;Moist Heat;Cryotherapy;Gait training;Stair training;Functional mobility training;Therapeutic exercise;Balance training;Neuromuscular  re-education;Cognitive remediation;Patient/family education;Orthotic Fit/Training;Wheelchair mobility training;Manual techniques;Passive range of motion;Energy conservation;Joint Manipulations    PT Next Visit Plan  Work on strength, sitting/standing balance, functional activities such as rolling, bed mobility, transfers    PT Home Exercise Plan  No updates today    Consulted and Agree with Plan of Care  Patient;Family member/caregiver    Family Member Consulted  daugher [name]       Patient will benefit from skilled therapeutic intervention in order to improve the following deficits and impairments:  Abnormal gait, Decreased activity tolerance, Decreased cognition, Decreased endurance, Decreased knowledge of use of DME, Decreased range of motion, Decreased skin integrity, Decreased strength, Impaired perceived functional ability, Impaired sensation, Impaired UE functional use, Improper body mechanics, Pain, Cardiopulmonary status limiting activity, Decreased balance, Decreased coordination, Decreased mobility, Difficulty walking, Impaired tone, Postural dysfunction  Visit Diagnosis: 1. Muscle weakness (generalized)   2. Other lack of coordination   3. Difficulty in walking, not elsewhere classified   4. Repeated falls        Problem List There are no active problems to display for this patient.   Trotter,Margaret PT, DPT 08/12/2018,  2:24 PM  [place] MAIN [facility] SERVICES [address] [place], [place], [address] Phone: [phone]   Fax:  [phone]  Name: Kenneth Summers MRN: [id number] Date of Birth: 25-Dec-1945

## 2018-08-14 ENCOUNTER — Encounter: Payer: Self-pay | Admitting: Occupational Therapy

## 2018-08-14 ENCOUNTER — Ambulatory Visit: Payer: Medicare Other

## 2018-08-14 ENCOUNTER — Other Ambulatory Visit: Payer: Self-pay

## 2018-08-14 ENCOUNTER — Ambulatory Visit: Payer: Medicare Other | Admitting: Occupational Therapy

## 2018-08-14 DIAGNOSIS — M6281 Muscle weakness (generalized): Secondary | ICD-10-CM

## 2018-08-14 DIAGNOSIS — R262 Difficulty in walking, not elsewhere classified: Secondary | ICD-10-CM

## 2018-08-14 DIAGNOSIS — R278 Other lack of coordination: Secondary | ICD-10-CM

## 2018-08-14 NOTE — Therapy (Signed)
[place] MAIN [facility] SERVICES [address] [place], [place], [address] Phone: [phone]   Fax:  [phone]  Physical Therapy Treatment  Patient Details  Name: Kenneth Summers MRN: [id number] Date of Birth: 23-Nov-1945 Referring Provider (PT): Dr. [name], Guy Begin   Encounter Date: 08/14/2018  PT End of Session - 08/14/18 1541    Visit Number  26    Number of Visits  36    Date for PT Re-Evaluation  09/11/18    Authorization Type  Medicare reporting period from 06/12/2018    Authorization Time Period  Current cert period 05/16/7351 - 09/11/2018    Authorization - Visit Number  7    Authorization - Number of Visits  10    PT Start Time  2992    PT Stop Time  1350    PT Time Calculation (min)  45 min    Equipment Utilized During Treatment  Gait belt    Activity Tolerance  Patient limited by fatigue    Behavior During Therapy  [facility] for tasks assessed/performed       Past Medical History:  Diagnosis Date  . Alcohol abuse   . APS (antiphospholipid syndrome) ([place])   . BPH (benign prostatic hyperplasia)   . CAD (coronary artery disease)   . Cellulitis   . Cervical spinal stenosis   . Chronic osteomyelitis ([place])   . CKD (chronic kidney disease)   . CKD (chronic kidney disease)   . Clostridium difficile diarrhea   . Depression   . Diplopia   . Fatigue   . GERD (gastroesophageal reflux disease)   . Hyperlipemia   . Hypertension   . Hypovitaminosis D   . IBS (irritable bowel syndrome)   . 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 Insomnia   . Left lumbosacral radiculopathy   . Moderate obstructive sleep apnea   . Osteomyelitis of foot ([place])   . PAD (peripheral artery disease) ([place])   . Pruritus   . RA (rheumatoid arthritis) ([place])   . Radiculopathy   . Restless legs syndrome   . RLS (restless legs syndrome)   . SLE (systemic lupus erythematosus related syndrome) ([place])   . Stroke ([place])   . Toxic maculopathy of both eyes     Past Surgical  History:  Procedure Laterality Date  . CARDIAC CATHETERIZATION    . CAROTID ENDARTERECTOMY    . CERVICAL LAMINECTOMY    . CORONARY ANGIOPLASTY    . FRACTURE SURGERY    . fusion C5-6-7    . HERNIA REPAIR    . KNEE ARTHROSCOPY    . TONSILLECTOMY      There were no vitals filed for this visit.  Subjective Assessment - 08/14/18 1320    Subjective  Pt reports doing well;  Denies any new falls; Reports minimal fatigue. He was having some R knee pain earlier today but it has since subsided. No specific questions or concerns currently.    Patient is accompained by:  Family member    Pertinent History  Patient is a 73 y.o. male who presents to outpatient physical therapy with a referral for medical diagnosis of CVA. This patient's chief complaints consist of left hemiplegia and overall deconditioning leading to the following functional deficits: dependent for ADLs, IADLs, unable to transfer to car, dependent transfers at home with hoyer lift, unable to walk or stand without significant assistance, difficulty with W/C navigation..    Limitations  Sitting;Lifting;Standing;House hold activities;Writing;Walking;Other (comment)    Diagnostic tests  Brain MRI 11/01/2017: IMPRESSION: 1. Acute right ACA territory infarct as demonstrated on prior CT imaging. No intracranial hemorrhage or significant mass effect. 2. 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    Patient Stated Goals  wants to be able to walk again    Currently in Pain?  No/denies    Pain Onset  --          TREATMENT:   Therapeutic Activity Patient transferred with sliding board wheelchair to mat table, minA +2 with mod VCs for trunk control, hand placement and positioning; Provided patient with increased time to scoot forward to edge of chair and to shift hips to better position prior to transfer. He required mod A for positioning feet for better position;   Sit<>stand transfer: With RW x 2 reps  with mod A +2, patient able to hold standing position for prolonged time with mod VCs to facilitate erect posture, improve hip/knee extension; he does stand with mild lateral lean with increased left knee flexion but is able to self correct with improve extension activation with verbal cues. He does lean more excessively backwards today requiring repeated cueing.   At end of session patientinstructed in squat pivot transfer mat table to wheelchair.  He requiredmod A +2 with mod VCs for trunk position and hand placement; He requires therapist assist to block leftknee for better positioning;   Instructed patient in bed mobility: MinA for sit to sidelying with therapist cues to cross right foot under left foot and to use right foot to assist lifting left leg onto mat table; Patient then rolled hooklying, min A +1  Transitioned sidelying to sitting withsupervision requiringcues for LE positioning and UE positioning; Pt slow with bed mobility requiring increased time to initiate movement due to decreased motor planning;  Practiced rolling right and left x 5 each direction. He requires repeated cues for contralateral heel slide/hip flexion as well as pelvic rotation concurrently with UE reaching/trunk rotation. He requires minA+1 for 3/5 reps to each direction and is able to perform the additional 2 reps with supervision only.    Ther-ex  Instructed patient in LE strengthening and flexibility exercise: Patient hooklying: L SAQ over bolster 5s hold x 10, pt performing intermittent SAQ on R side during rest. Intermittent muscle tapping for improved activation as well as frequent redirection due to distraction; L manually resisted leg press x 10; Alternating marching x 10 on each side with occasional redirection due to distraction; Clams with gentle manual resistance x 10, improved activation noted today; Adductor squeeze with gentle manual resistance, less resistance on the left x 10; Supine  SLR L hip abduction/adduction with gentle assist and redirection provided by therapist x 10; Bridges with knees resting on bolsterx 10reps with 3-5s hold. Pt requires repeated cues to hold bridge position. Uneven pelvic height with less activation noted on the left side.   LLE SLR hip flexion AAROM x 10 reps with min VCs to increase quad set prior to hip flexion for better knee control; patient able to exhibit improved quad set activation;   Pt educated throughout session about proper posture and technique with exercises. Improved exercise technique, movement at target joints, use of target muscles after min to mod verbal, visual, tactile cues.    Pt iscontinuing tomakegood progress with therapy.He demonstrates improved L quad activation during SAQ and AAROM SLR. He also demonstrates improved hip strength with hooklying clams and adductor squeezes. He does require repeated redirection during mat table exercises due  to distraction but this is also gradually improving compared to previous sessions. Improving standing endurance although today pt does struggle with significant posterior leaning.He is still unsafe toattempt any form ofambulation.Pt will benefit from PT services to address deficits in strength, balance, and mobility in order to return to full function at home.                     PT Short Term Goals - 08/05/18 1326      PT SHORT TERM GOAL #1   Title  Be independent with initial home exercise program for self-management of symptoms.    Baseline  HEP to be given at second session; advice to practice unsupported sitting at home (06/19/2018); 07/08/18: Performing at home, 6/29: needs assistance but is doing exercise program regularly;    Time  2    Period  Weeks    Status  On-going    Target Date  06/26/18        PT Long Term Goals - 07/30/18 1429      PT LONG TERM GOAL #1   Title  Patient will complete all bed mobility with min A to improve functional  independence for getting in and out of bed and adjusting in bed.     Baseline  max A - total A reported by family (06/19/2018); 07/08/18: still requires maxA, dtr reports improvement in rolling since starting therapy; 6/23: min A to supervision in clinic; not doing at home right    Time  12    Period  Weeks    Status  On-going    Target Date  09/11/18      PT LONG TERM GOAL #2   Title  Patient will complete sit <> stand transfer chair to chair with Min A and LRAD to improve functional independence for household and community mobility.     Baseline  max A +2 STS at W/C (06/12/2018); max A +2-3 STS or stand pivot transfer, able to complete more reps (06/19/2018); 07/08/18: not safe to attempt at this time    Time  12    Period  Weeks    Status  On-going    Target Date  09/11/18      PT LONG TERM GOAL #3   Title  Patient will navigate manual w/c with min A x 100 feet to improve mobility for household and short community distances.     Baseline  required total A (06/12/2018); able to roll 20 feet with min A (06/20/2018); 07/08/18: Pt can go approximately 10' without assist per family;    Time  12    Period  Weeks    Status  On-going    Target Date  09/11/18      PT LONG TERM GOAL #4   Title  Patient will complete car transfers W/C to family SUV with min A using LRAD to improve his abilty to participate in community activities.     Baseline  unable to complete car transfers at all (06/12/2018); unable to complete car transfers at all (06/19/2018).; 07/08/18: Unable to perform at this time    Time  12    Period  Weeks    Status  On-going    Target Date  09/11/18      PT LONG TERM GOAL #5   Title  Patient will ambulate at least 150 feet with mod I using LRAD to improve mobility for household and community distances.     Baseline  unable to take  steps (06/12/2018); able to shift R leg forward and back with max A and RW at edge of plinth (06/19/2018); 07/08/18: unable to ambulate at this time.     Time  12     Period  Weeks    Status  On-going    Target Date  09/11/18      Additional Long Term Goals   Additional Long Term Goals  Yes      PT LONG TERM GOAL #6   Title  Patient will navigate 4 steps with min A and BUE support to improve his ability to access community and social participation.     Baseline  unable to complet stairs (06/12/2018); unable to complete stairs (06/19/2018); 07/08/18: unable to attempt at this time    Time  12    Period  Weeks    Status  On-going    Target Date  09/11/18      PT LONG TERM GOAL #7   Title  Patient will increase functional reach test to >15 inches in sitting to exhibit improved sitting balance and positioning and reduce fall risk;    Baseline  07/30/18: 12 inches    Time  4    Period  Weeks    Status  New    Target Date  09/11/18            Plan - 08/14/18 1543    Clinical Impression Statement  Pt is continuing to make good progress with therapy. He demonstrates improved L quad activation during SAQ and AAROM SLR. He also demonstrates improved hip strength with hooklying clams and adductor squeezes. He does require repeated redirection during mat table exercises due to distraction but this is also gradually improving compared to previous sessions. Improving standing endurance although today pt does struggle with significant posterior leaning. He is still unsafe to attempt any form of ambulation. Pt will benefit from PT services to address deficits in strength, balance, and mobility in order to return to full function at home.    Personal Factors and Comorbidities  Age;Comorbidity 3+    Comorbidities  Relevant past medical history and comorbidities include long term steroid use for lupus, CKD, chronic osteomyelitis, cervical spine stenosis, BPH, APS, alcohol abuse, peripheral artery disease, depression, GERD, obstructive sleep apnea, HTN, IBS, IgA deficiency, lumbar radiculopathy, RA, Stroke, toxic maculopathy in both eyes, systemic lupus erythematosus  related syndrome, cardiac catheterization, carotid endarterectomy, cervical laminectomy, coronary angioplasty, Fusion C5-C7, knee arthroplasty, lumbar surgery.    Examination-Activity Limitations  Bed Mobility;Bend;Sit;Toileting;Stand;Stairs;Lift;Transfers;Squat;Locomotion Level;Carry;Dressing;Hygiene/Grooming;Continence    Examination-Participation Restrictions  Yard Work;Interpersonal Relationship;Community Activity    Stability/Clinical Decision Making  Evolving/Moderate complexity    Rehab Potential  Fair    PT Frequency  3x / week    PT Duration  12 weeks    PT Treatment/Interventions  ADLs/Self Care Home Management;Electrical Stimulation;Moist Heat;Cryotherapy;Gait training;Stair training;Functional mobility training;Therapeutic exercise;Balance training;Neuromuscular re-education;Cognitive remediation;Patient/family education;Orthotic Fit/Training;Wheelchair mobility training;Manual techniques;Passive range of motion;Energy conservation;Joint Manipulations    PT Next Visit Plan  Work on strength, sitting/standing balance, functional activities such as rolling, bed mobility, transfers    PT Home Exercise Plan  No updates today    Consulted and Agree with Plan of Care  Patient;Family member/caregiver    Family Member Consulted  daugher [name]       Patient will benefit from skilled therapeutic intervention in order to improve the following deficits and impairments:  Abnormal gait, Decreased activity tolerance, Decreased cognition, Decreased endurance, Decreased knowledge of use of DME, Decreased range of  motion, Decreased skin integrity, Decreased strength, Impaired perceived functional ability, Impaired sensation, Impaired UE functional use, Improper body mechanics, Pain, Cardiopulmonary status limiting activity, Decreased balance, Decreased coordination, Decreased mobility, Difficulty walking, Impaired tone, Postural dysfunction  Visit Diagnosis: 1. Muscle weakness (generalized)   2.  Difficulty in walking, not elsewhere classified        Problem List There are no active problems to display for this patient.  Phillips Grout PT, DPT, GCS  Darius Lundberg 08/15/2018, 8:41 PM  [place] MAIN [facility] SERVICES [address] [place], [place], [address] Phone: [phone]   Fax:  [phone]  Name: ODDIS WESTLING MRN: [id number] Date of Birth: Jun 23, 1945

## 2018-08-14 NOTE — Therapy (Signed)
[place] MAIN [facility] SERVICES [address] [place], [place], [address] Phone: [phone]   Fax:  [phone]  Occupational Therapy Treatment  Patient Details  Name: Kenneth Summers MRN: [id number] Date of Birth: Jan 10, 1946 No data recorded  Encounter Date: 08/14/2018  OT End of Session - 08/14/18 1713    Visit Number  24    Number of Visits  48    Date for OT Re-Evaluation  09/04/18    OT Start Time  1350    OT Stop Time  1430    OT Time Calculation (min)  40 min    Activity Tolerance  Patient tolerated treatment well    Behavior During Therapy  [facility] for tasks assessed/performed       Past Medical History:  Diagnosis Date  . Alcohol abuse   . APS (antiphospholipid syndrome) ([place])   . BPH (benign prostatic hyperplasia)   . CAD (coronary artery disease)   . Cellulitis   . Cervical spinal stenosis   . Chronic osteomyelitis ([place])   . CKD (chronic kidney disease)   . CKD (chronic kidney disease)   . Clostridium difficile diarrhea   . Depression   . Diplopia   . Fatigue   . GERD (gastroesophageal reflux disease)   . Hyperlipemia   . Hypertension   . Hypovitaminosis D   . IBS (irritable bowel syndrome)   . IgA deficiency ([place])   . Insomnia   . Left lumbosacral radiculopathy   . Moderate obstructive sleep apnea   . Osteomyelitis of foot ([place])   . PAD (peripheral artery disease) ([place])   . Pruritus   . RA (rheumatoid arthritis) ([place])   . Radiculopathy   . Restless legs syndrome   . RLS (restless legs syndrome)   . SLE (systemic lupus erythematosus related syndrome) ([place])   . Stroke ([place])   . Toxic maculopathy of both eyes     Past Surgical History:  Procedure Laterality Date  . CARDIAC CATHETERIZATION    . CAROTID ENDARTERECTOMY    . CERVICAL LAMINECTOMY    . CORONARY ANGIOPLASTY    . FRACTURE SURGERY    . fusion C5-6-7    . HERNIA REPAIR    . KNEE ARTHROSCOPY    . TONSILLECTOMY      There were no vitals filed for  this visit.  Subjective Assessment - 08/14/18 1712    Subjective   Kenneth Summers. had difficulty keeping his mask from moving around,a dnexposing his nose, as well as moving up near his eyes. Kenneth Summers. was offered, and provided with a new mask.    Pertinent History  Kenneth Summers. is a 73 y.o. 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 Kenneth Summers. PMHx includes: Multiple Falls, Lupus, DJD, Renal Abscess, CVA. Kenneth Summers. resides with his wife. Kenneth Summers.'s wife and daughter assist with ADLs. Kenneth Summers. has caregivers in  for 2 hours a day, 6 days a week. Kenneth Summers. received Rehab services in acute care, at SNF for STR, and [place] services. Kenneth Summers. is retired from [employer] for [employer] and [employer].    Patient Stated Goals  To regain use of his left UE, and do more for himself.    Currently in Pain?  No/denies      OT TREATMENT    Neuromuscular re-ed:  Kenneth Summers. worked on using his left hand for grasping, and manipulating 1/2" washers from a magnetic dish using point grasp pattern. Kenneth Summers. worked on reaching up, stabilizing, and sustaining  shoulder elevation while placing the washer over a small precise target on vertical dowels positioned at various angles.  Kenneth Summers. worked on reaching for the target, and incorporating trunk flexion for an extended reach to place the washers onto vertical dowels. Kenneth Summers. had difficulty removing the washers, and picking them up off of the base of the platform to remove them. Kenneth Summers. worked on [employer]. Starting with thicker knots, and progressing to thinner rope. Kenneth Summers. Worked on bilateral hand coordination skills to untie the knots.                           OT Education - 08/14/18 1713    Education Details  Left hand function    Person(s) Educated  Patient    Methods  Explanation;Demonstration    Comprehension  Verbalized understanding;Returned demonstration          OT Long Term Goals - 08/05/18 1727      OT LONG TERM GOAL #1   Title  Kenneth Summers. will increase left shoulder  flexion AROM by 10 degrees to access cabinet/shelf.    Baseline  AROM left shoulder flexion: 104    Time  12    Period  Weeks    Status  On-going    Target Date  09/04/18      OT LONG TERM GOAL #2   Title  Kenneth Summers. will donn a shirt with Supervision.    Baseline  Min-ModA    Time  12    Period  Weeks    Status  On-going    Target Date  09/04/18      OT LONG TERM GOAL #3   Title  Kenneth Summers. will require ModA to perform LE dressing    Baseline  MaxA left, ModA with the right    Time  12    Period  Weeks    Status  On-going    Target Date  09/04/18      OT LONG TERM GOAL #4   Title  Kenneth Summers. will improve left grip strength by 10# to be able to open a jar/container.    Baseline  Left: 29, Right 40    Time  12    Status  On-going    Target Date  09/04/18      OT LONG TERM GOAL #5   Title  Kenneth Summers. will improve left hand [facility] skills to be able to assit with buttoning/zipping.    Baseline  Left: 1 min & 52 sec. R: 56 sec.    Time  12    Period  Weeks    Status  On-going    Target Date  09/04/18      OT LONG TERM GOAL #6   Title  Kenneth Summers. will demonstrate visual conmpensatory strategies during 100% of the time during self care.    Baseline  Continue    Time  12    Period  Weeks    Status  On-going    Target Date  09/04/18      OT LONG TERM GOAL #7   Title  Kenneth Summers. will prepare a simple cold snack from w/c with supervision using cognitive compensatory strategies 100% of the time.    Baseline  Kenneth Summers. is unable    Time  12    Period  Weeks    Status  On-going    Target Date  09/04/18      OT LONG TERM GOAL #8   Title  Kenneth Summers.  will accurately identify potential safety hazard using good safety awareness, and judgement 100% for ADLs, and IADLs.    Baseline  Continue    Time  12    Period  Weeks    Status  On-going    Target Date  09/04/18            Plan - 08/14/18 1714    Clinical Impression Statement  Kenneth Summers. with no complaints of restleg syndrome today, or pain today. Kenneth Summers. continues to work on  improving with attention, left sided awareness,  BUE strength, and [facility] skills. Kenneth Summers. is engaging his RUE more during ADL, and IADL tasks. Kenneth Summers. continues to work on improving overall ADL, and IADL functioning.    Occupational performance deficits (Please refer to evaluation for details):  ADL's    Body Structure / Function / Physical Skills  UE functional use;Coordination;FMC;Dexterity;Strength;ROM    Cognitive Skills  Attention;Memory;Emotional;Problem Solve;Safety Awareness    Clinical Decision Making  Multiple treatment options, significant modification of task necessary    Comorbidities Affecting Occupational Performance:  Presence of comorbidities impacting occupational performance    Comorbidities impacting occupational performance description:  Phyical, cognitive, visual,  medical comorbidities    Modification or Assistance to Complete Evaluation   Max significant modification of tasks or assist is necessary to complete    OT Frequency  3x / week    OT Duration  12 weeks    OT Treatment/Interventions  Self-care/ADL training;DME and/or AE instruction;Therapeutic exercise;Therapeutic activities;Moist Heat;Cognitive remediation/compensation;Neuromuscular education;Visual/perceptual remediation/compensation;Coping strategies training;Patient/family education;Passive range of motion;Psychosocial skills training;Energy conservation;Functional Mobility Training    Consulted and Agree with Plan of Care  Patient;Family member/caregiver    Family Member Consulted  Daughter [name]       Patient will benefit from skilled therapeutic intervention in order to improve the following deficits and impairments:   Body Structure / Function / Physical Skills: UE functional use, Coordination, FMC, Dexterity, Strength, ROM Cognitive Skills: Attention, Memory, Emotional, Problem Solve, Safety Awareness     Visit Diagnosis: 1. Muscle weakness (generalized)   2. Other lack of coordination       Problem  List There are no active problems to display for this patient.   Harrel Carina, MS, OTR/L 08/14/2018, 5:21 PM  [place] MAIN [facility] SERVICES [address] [place], [place], [address] Phone: [phone]   Fax:  [phone]  Name: MANLEY FASON MRN: [id number] Date of Birth: [birth year]

## 2018-08-15 ENCOUNTER — Ambulatory Visit: Payer: Medicare Other | Admitting: Physical Therapy

## 2018-08-15 ENCOUNTER — Other Ambulatory Visit: Payer: Self-pay

## 2018-08-15 ENCOUNTER — Encounter: Payer: Self-pay | Admitting: Occupational Therapy

## 2018-08-15 ENCOUNTER — Ambulatory Visit: Payer: Medicare Other | Admitting: Occupational Therapy

## 2018-08-15 ENCOUNTER — Encounter: Payer: Self-pay | Admitting: Physical Therapy

## 2018-08-15 DIAGNOSIS — M6281 Muscle weakness (generalized): Secondary | ICD-10-CM | POA: Diagnosis not present

## 2018-08-15 DIAGNOSIS — R262 Difficulty in walking, not elsewhere classified: Secondary | ICD-10-CM

## 2018-08-15 DIAGNOSIS — R278 Other lack of coordination: Secondary | ICD-10-CM

## 2018-08-15 DIAGNOSIS — R296 Repeated falls: Secondary | ICD-10-CM

## 2018-08-15 DIAGNOSIS — H547 Unspecified visual loss: Secondary | ICD-10-CM

## 2018-08-15 NOTE — Therapy (Addendum)
[place] MAIN [facility] SERVICES [address] [place], [place], [address] Phone: [phone]   Fax:  [phone]  Occupational Therapy Treatment  Patient Details  Name: Kenneth Summers MRN: [id number] Date of Birth: 1945-11-14 No data recorded  Encounter Date: 08/15/2018  OT End of Session - 08/15/18 1444    Visit Number  25    Number of Visits  28    Date for OT Re-Evaluation  09/04/18    Authorization Type  Progress report period starting 06/12/2018    OT Start Time  1400    OT Stop Time  1430    OT Time Calculation (min)  30 min    Activity Tolerance  Patient tolerated treatment well    Behavior During Therapy  [facility] for tasks assessed/performed       Past Medical History:  Diagnosis Date  . Alcohol abuse   . APS (antiphospholipid syndrome) (Kenneth Summers)   . BPH (benign prostatic hyperplasia)   . CAD (coronary artery disease)   . Cellulitis   . Cervical spinal stenosis   . Chronic osteomyelitis (Kenneth Summers)   . CKD (chronic kidney disease)   . CKD (chronic kidney disease)   . Clostridium difficile diarrhea   . Depression   . Diplopia   . Fatigue   . GERD (gastroesophageal reflux disease)   . Hyperlipemia   . Hypertension   . Hypovitaminosis D   . IBS (irritable bowel syndrome)   . IgA deficiency (Kenneth Summers)   . Insomnia   . Left lumbosacral radiculopathy   . Moderate obstructive sleep apnea   . Osteomyelitis of foot (Kenneth Summers)   . PAD (peripheral artery disease) (Kenneth Summers)   . Pruritus   . RA (rheumatoid arthritis) (Kenneth Summers)   . Radiculopathy   . Restless legs syndrome   . RLS (restless legs syndrome)   . SLE (systemic lupus erythematosus related syndrome) (Kenneth Summers)   . Stroke (Kenneth Summers)   . Toxic maculopathy of both eyes     Past Surgical History:  Procedure Laterality Date  . CARDIAC CATHETERIZATION    . CAROTID ENDARTERECTOMY    . CERVICAL LAMINECTOMY    . CORONARY ANGIOPLASTY    . FRACTURE SURGERY    . fusion C5-6-7    . HERNIA REPAIR    . KNEE  ARTHROSCOPY    . TONSILLECTOMY      There were no vitals filed for this visit.  Subjective Assessment - 08/15/18 1440    Subjective   Pt. was late to the session secondary transportation.    Pertinent History  Pt. is a 73 y.o. 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 Pt. PMHx includes: Multiple Falls, Lupus, DJD, Renal Abscess, CVA. Pt. resides with his wife. Pt.'s wife and daughter assist with ADLs. Pt. has caregivers in  for 2 hours a day, 6 days a week. Pt. received Rehab services in acute care, at SNF for STR, and [place] services. Pt. is retired from [employer] for [employer].    Patient Stated Goals  To regain use of his left UE, and do more for himself.    Currently in Pain?  No/denies    Pain Orientation  Left      OT TREATMENT    Selfcare:  Pt. education was provided about visual scanning strategies, and activities at home to promote visual scanning, visual search strategies.  OT TREATMENT    The BiV[employer] Visual Attention Assessment was administered to  determine how his visual impairments may be affecting his ADL, and IADL functioning. SEARCH STRATEGIES FOR NEAR SPACE  Structured visual Array Single letter search-simple Time: 2 min & 48 sec. Omissions: 15 search strategy used: random, disorganized scan visual search strategy Word Search Time: 1 min & 46 sec. Omissions: 19 Search Strategy used:horizontal, and vertical rectilinear  Structured complex circles search Time: 2 min.  Omissions: 11  Omissions, 4 misidentifications Search Strategy used: random disorganized search strategy.  Unstructured Visual Array Random Plain Circles-Simple Time:  50 sec. Omissions: No omissions Search strategy used: symmetrical horizontal rectilinear Random Plain Circles-Crowded: Time: 2 min & 14 sec. Omissions: 1  Search Strategy used: initially horizontal rectilinear, then random, disorganized search strategy Random Complex Circles  Search Time: 2 min. & 22 sec.    Omissions: 10  Search strategy used: random disorganized visual search strategy                         OT Education - 08/15/18 1444    Education Details  Left hand function    Person(s) Educated  Patient    Methods  Explanation;Demonstration    Comprehension  Verbalized understanding;Returned demonstration          OT Long Term Goals - 08/05/18 1727      OT LONG TERM GOAL #1   Title  Pt. will increase left shoulder flexion AROM by 10 degrees to access cabinet/shelf.    Baseline  AROM left shoulder flexion: 104    Time  12    Period  Weeks    Status  On-going    Target Date  09/04/18      OT LONG TERM GOAL #2   Title  Pt. will donn a shirt with Supervision.    Baseline  Min-ModA    Time  12    Period  Weeks    Status  On-going    Target Date  09/04/18      OT LONG TERM GOAL #3   Title  Pt. will require ModA to perform LE dressing    Baseline  MaxA left, ModA with the right    Time  12    Period  Weeks    Status  On-going    Target Date  09/04/18      OT LONG TERM GOAL #4   Title  Pt. will improve left grip strength by 10# to be able to open a jar/container.    Baseline  Left: 29, Right 40    Time  12    Status  On-going    Target Date  09/04/18      OT LONG TERM GOAL #5   Title  Pt. will improve left hand [facility] skills to be able to assit with buttoning/zipping.    Baseline  Left: 1 min & 52 sec. R: 56 sec.    Time  12    Period  Weeks    Status  On-going    Target Date  09/04/18      OT LONG TERM GOAL #6   Title  Pt. will demonstrate visual conmpensatory strategies during 100% of the time during self care.    Baseline  Continue    Time  12    Period  Weeks    Status  On-going    Target Date  09/04/18      OT LONG TERM GOAL #7   Title  Pt. will prepare a simple cold snack from  w/c with supervision using cognitive compensatory strategies 100% of the time.    Baseline  Pt. is unable    Time  12     Period  Weeks    Status  On-going    Target Date  09/04/18      OT LONG TERM GOAL #8   Title  Pt. will accurately identify potential safety hazard using good safety awareness, and judgement 100% for ADLs, and IADLs.    Baseline  Continue    Time  12    Period  Weeks    Status  On-going    Target Date  09/04/18            Plan - 08/15/18 1445    Clinical Impression Statement  Pt. is making progress overall. Pt. is improving with attention, and left sided awareness.Pt. continues to present with limited visual scanning skills to the left utilizing disorganized scanning patterns in his near space during tabletop tasks. Pt. continues to work on improving UE strength, and [facility] skills in order to improve overall ADL, and IADL functioning.    Occupational performance deficits (Please refer to evaluation for details):  ADL's    Body Structure / Function / Physical Skills  UE functional use;Coordination;FMC;Dexterity;Strength;ROM    Cognitive Skills  Attention;Memory;Emotional;Problem Solve;Safety Awareness    Rehab Potential  Good    Clinical Decision Making  Multiple treatment options, significant modification of task necessary    Comorbidities Affecting Occupational Performance:  Presence of comorbidities impacting occupational performance    Comorbidities impacting occupational performance description:  Phyical, cognitive, visual,  medical comorbidities    Modification or Assistance to Complete Evaluation   Max significant modification of tasks or assist is necessary to complete    OT Treatment/Interventions  Self-care/ADL training;DME and/or AE instruction;Therapeutic exercise;Therapeutic activities;Moist Heat;Cognitive remediation/compensation;Neuromuscular education;Visual/perceptual remediation/compensation;Coping strategies training;Patient/family education;Passive range of motion;Psychosocial skills training;Energy conservation;Functional Mobility Training    Recommended Other Services   PT    Consulted and Agree with Plan of Care  Patient;Family member/caregiver    Family Member Consulted  Daughter [name]       Patient will benefit from skilled therapeutic intervention in order to improve the following deficits and impairments:   Body Structure / Function / Physical Skills: UE functional use, Coordination, FMC, Dexterity, Strength, ROM Cognitive Skills: Attention, Memory, Emotional, Problem Solve, Safety Awareness     Visit Diagnosis: 1. Muscle weakness (generalized)   2. Vision impairment       Problem List There are no active problems to display for this patient.   Harrel Carina, MS, OTR/L 08/15/2018, 2:53 PM  [place] MAIN [facility] SERVICES [address] [place], [place], [address] Phone: [phone]   Fax:  [phone]  Name: XZAVION DOSWELL MRN: [id number] Date of Birth: 1945-08-25

## 2018-08-15 NOTE — Therapy (Signed)
[place] MAIN [facility] SERVICES [address] [place], [place], [address] Phone: [phone]   Fax:  [phone]  Physical Therapy Treatment  Patient Details  Name: Kenneth Summers MRN: [id number] Date of Birth: 1946-02-02 Referring Provider (PT): Dr. [name], Guy Begin   Encounter Date: 08/15/2018  PT End of Session - 08/15/18 1540    Visit Number  27    Number of Visits  36    Date for PT Re-Evaluation  09/11/18    Authorization Type  Medicare reporting period from 06/12/2018    Authorization Time Period  Current cert period 4/81/8563 - 09/11/2018    PT Start Time  1432    PT Stop Time  1515    PT Time Calculation (min)  43 min    Equipment Utilized During Treatment  Gait belt    Activity Tolerance  Patient limited by fatigue    Behavior During Therapy  [facility] for tasks assessed/performed       Past Medical History:  Diagnosis Date  . Alcohol abuse   . APS (antiphospholipid syndrome) ([place])   . BPH (benign prostatic hyperplasia)   . CAD (coronary artery disease)   . Cellulitis   . Cervical spinal stenosis   . Chronic osteomyelitis ([place])   . CKD (chronic kidney disease)   . CKD (chronic kidney disease)   . Clostridium difficile diarrhea   . Depression   . Diplopia   . Fatigue   . GERD (gastroesophageal reflux disease)   . Hyperlipemia   . Hypertension   . Hypovitaminosis D   . IBS (irritable bowel syndrome)   . IgA deficiency ([place])   . Insomnia   . Left lumbosacral radiculopathy   . Moderate obstructive sleep apnea   . Osteomyelitis of foot ([place])   . PAD (peripheral artery disease) ([place])   . Pruritus   . RA (rheumatoid arthritis) ([place])   . Radiculopathy   . Restless legs syndrome   . RLS (restless legs syndrome)   . SLE (systemic lupus erythematosus related syndrome) ([place])   . Stroke ([place])   . Toxic maculopathy of both eyes     Past Surgical History:  Procedure Laterality Date  . CARDIAC CATHETERIZATION    . CAROTID  ENDARTERECTOMY    . CERVICAL LAMINECTOMY    . CORONARY ANGIOPLASTY    . FRACTURE SURGERY    . fusion C5-6-7    . HERNIA REPAIR    . KNEE ARTHROSCOPY    . TONSILLECTOMY      There were no vitals filed for this visit.  Subjective Assessment - 08/15/18 1540    Subjective  Patient reports doing well; denies any pain; reports adherence with HEP; patient denies any fatigue;    Patient is accompained by:  Family member    Pertinent History  Patient is a 73 y.o. male who presents to outpatient physical therapy with a referral for medical diagnosis of CVA. This patient's chief complaints consist of left hemiplegia and overall deconditioning leading to the following functional deficits: dependent for ADLs, IADLs, unable to transfer to car, dependent transfers at home with hoyer lift, unable to walk or stand without significant assistance, difficulty with W/C navigation..    Limitations  Sitting;Lifting;Standing;House hold activities;Writing;Walking;Other (comment)    Diagnostic tests  Brain MRI 11/01/2017: IMPRESSION: 1. Acute right ACA territory infarct as demonstrated on prior CT imaging. No intracranial hemorrhage or significant mass effect. 2. 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  Patient Stated Goals  wants to be able to walk again    Currently in Pain?  No/denies    Multiple Pain Sites  No         TREATMENT:  PT wheeled patient in parallel bars to work on sit<>stand transfers  Patient transferred sit<>Stand from wheelchair using 2HHA pushing on bars x7 reps; Required mod A -min A +2 with therapist blocking LLE knee and mod VCs for hand/LE placement including to bring hands under shoulders to facilitate better forward weight shift and to increase push through BLE and BUE for better transfer ability; Patient able to shift weight forward well with initial transfer but then mid transfer occasionally exhibits heavy posterior lean requiring max VCs and  tactile cues for forward weight shift.   Upon standing patient required mod-max VCs and tactile/visual cues for erect posture including "push down" through LE for better hip/knee extension and to stand tall for better trunk control;  In standing: Instructed patient in side/side weight shift x1 min x3 sets Instructed patient in forward/backward step with RLE with therapist blocking LLE knee and cues for erect posture x5 steps x2 sets; Attempted stepping with LLE but patient too fatigued and unable to hold weight onto RLE with increased hip flexion and posterior lean;  Seated: Instructed patient in seated marches alternate LE to facilitate better muscle activation in LLE x10 reps bilaterally with AROM on RLE, AAROM on LLE; required visual cues to increase ROM for better strengthening;   Response to treatment: Patient tolerated well; He was very fatigued with advanced standing and increased transfer repetition with increased shortness of breath and decreased power. Patient does require frequent seated rest breaks between transfers to recover. His vitals were monitored with good SPO2/HR levels. Patient able to exhibit better forward weight shift with initial transfer but does have difficulty maintaining forward weight shift during mid transfer and upon standing.                       PT Education - 08/15/18 1540    Education Details  transfers/positioning;    Person(s) Educated  Patient;Child(ren)    Methods  Explanation;Demonstration;Verbal cues    Comprehension  Verbalized understanding;Returned demonstration;Verbal cues required;Need further instruction       PT Short Term Goals - 08/05/18 1326      PT SHORT TERM GOAL #1   Title  Be independent with initial home exercise program for self-management of symptoms.    Baseline  HEP to be given at second session; advice to practice unsupported sitting at home (06/19/2018); 07/08/18: Performing at home, 6/29: needs assistance but  is doing exercise program regularly;    Time  2    Period  Weeks    Status  On-going    Target Date  06/26/18        PT Long Term Goals - 07/30/18 1429      PT LONG TERM GOAL #1   Title  Patient will complete all bed mobility with min A to improve functional independence for getting in and out of bed and adjusting in bed.     Baseline  max A - total A reported by family (06/19/2018); 07/08/18: still requires maxA, dtr reports improvement in rolling since starting therapy; 6/23: min A to supervision in clinic; not doing at home right    Time  12    Period  Weeks    Status  On-going    Target Date  09/11/18  PT LONG TERM GOAL #2   Title  Patient will complete sit <> stand transfer chair to chair with Min A and LRAD to improve functional independence for household and community mobility.     Baseline  max A +2 STS at W/C (06/12/2018); max A +2-3 STS or stand pivot transfer, able to complete more reps (06/19/2018); 07/08/18: not safe to attempt at this time    Time  12    Period  Weeks    Status  On-going    Target Date  09/11/18      PT LONG TERM GOAL #3   Title  Patient will navigate manual w/c with min A x 100 feet to improve mobility for household and short community distances.     Baseline  required total A (06/12/2018); able to roll 20 feet with min A (06/20/2018); 07/08/18: Pt can go approximately 10' without assist per family;    Time  12    Period  Weeks    Status  On-going    Target Date  09/11/18      PT LONG TERM GOAL #4   Title  Patient will complete car transfers W/C to family SUV with min A using LRAD to improve his abilty to participate in community activities.     Baseline  unable to complete car transfers at all (06/12/2018); unable to complete car transfers at all (06/19/2018).; 07/08/18: Unable to perform at this time    Time  12    Period  Weeks    Status  On-going    Target Date  09/11/18      PT LONG TERM GOAL #5   Title  Patient will ambulate at least 150 feet with  mod I using LRAD to improve mobility for household and community distances.     Baseline  unable to take steps (06/12/2018); able to shift R leg forward and back with max A and RW at edge of plinth (06/19/2018); 07/08/18: unable to ambulate at this time.     Time  12    Period  Weeks    Status  On-going    Target Date  09/11/18      Additional Long Term Goals   Additional Long Term Goals  Yes      PT LONG TERM GOAL #6   Title  Patient will navigate 4 steps with min A and BUE support to improve his ability to access community and social participation.     Baseline  unable to complet stairs (06/12/2018); unable to complete stairs (06/19/2018); 07/08/18: unable to attempt at this time    Time  12    Period  Weeks    Status  On-going    Target Date  09/11/18      PT LONG TERM GOAL #7   Title  Patient will increase functional reach test to >15 inches in sitting to exhibit improved sitting balance and positioning and reduce fall risk;    Baseline  07/30/18: 12 inches    Time  4    Period  Weeks    Status  New    Target Date  09/11/18            Plan - 08/15/18 1541    Clinical Impression Statement  Patient tolerated session fair. Focused on sit<>Stand transfers in parallel bars to facilitate better standing posture and positioning. Patient fatigues quickly being unable to hold standing position for long periods of time. However he is able to exhibit better hip/knee  extension in standing with visual and tactile cues for better positioning. Patient able to take a step with RLE today with therapist blocking LLE knee for better safety in stance. He reports heavy fatigue at end of session; patient would benefit from additional skilled PT intervention to improve strength, balance and gait safety;    Personal Factors and Comorbidities  Age;Comorbidity 3+    Comorbidities  Relevant past medical history and comorbidities include long term steroid use for lupus, CKD, chronic osteomyelitis, cervical spine  stenosis, BPH, APS, alcohol abuse, peripheral artery disease, depression, GERD, obstructive sleep apnea, HTN, IBS, IgA deficiency, lumbar radiculopathy, RA, Stroke, toxic maculopathy in both eyes, systemic lupus erythematosus related syndrome, cardiac catheterization, carotid endarterectomy, cervical laminectomy, coronary angioplasty, Fusion C5-C7, knee arthroplasty, lumbar surgery.    Examination-Activity Limitations  Bed Mobility;Bend;Sit;Toileting;Stand;Stairs;Lift;Transfers;Squat;Locomotion Level;Carry;Dressing;Hygiene/Grooming;Continence    Examination-Participation Restrictions  Yard Work;Interpersonal Relationship;Community Activity    Stability/Clinical Decision Making  Evolving/Moderate complexity    Rehab Potential  Fair    PT Frequency  3x / week    PT Duration  12 weeks    PT Treatment/Interventions  ADLs/Self Care Home Management;Electrical Stimulation;Moist Heat;Cryotherapy;Gait training;Stair training;Functional mobility training;Therapeutic exercise;Balance training;Neuromuscular re-education;Cognitive remediation;Patient/family education;Orthotic Fit/Training;Wheelchair mobility training;Manual techniques;Passive range of motion;Energy conservation;Joint Manipulations    PT Next Visit Plan  Work on strength, sitting/standing balance, functional activities such as rolling, bed mobility, transfers    PT Home Exercise Plan  No updates today    Consulted and Agree with Plan of Care  Patient;Family member/caregiver    Family Member Consulted  daugher [name]       Patient will benefit from skilled therapeutic intervention in order to improve the following deficits and impairments:  Abnormal gait, Decreased activity tolerance, Decreased cognition, Decreased endurance, Decreased knowledge of use of DME, Decreased range of motion, Decreased skin integrity, Decreased strength, Impaired perceived functional ability, Impaired sensation, Impaired UE functional use, Improper body mechanics, Pain,  Cardiopulmonary status limiting activity, Decreased balance, Decreased coordination, Decreased mobility, Difficulty walking, Impaired tone, Postural dysfunction  Visit Diagnosis: 1. Muscle weakness (generalized)   2. Other lack of coordination   3. Difficulty in walking, not elsewhere classified   4. Repeated falls        Problem List There are no active problems to display for this patient.   Araseli Sherry PT, DPT 08/15/2018, 3:45 PM  [place] MAIN [facility] SERVICES [address] [place], [place], [address] Phone: [phone]   Fax:  [phone]  Name: Kenneth Summers MRN: [id number] Date of Birth: 1945/10/13

## 2018-08-19 ENCOUNTER — Ambulatory Visit: Payer: Medicare Other | Admitting: Physical Therapy

## 2018-08-19 ENCOUNTER — Encounter: Payer: Self-pay | Admitting: Physical Therapy

## 2018-08-19 ENCOUNTER — Other Ambulatory Visit: Payer: Self-pay

## 2018-08-19 ENCOUNTER — Ambulatory Visit: Payer: Medicare Other | Admitting: Occupational Therapy

## 2018-08-19 ENCOUNTER — Encounter: Payer: Self-pay | Admitting: Occupational Therapy

## 2018-08-19 DIAGNOSIS — R262 Difficulty in walking, not elsewhere classified: Secondary | ICD-10-CM

## 2018-08-19 DIAGNOSIS — M6281 Muscle weakness (generalized): Secondary | ICD-10-CM

## 2018-08-19 DIAGNOSIS — R278 Other lack of coordination: Secondary | ICD-10-CM

## 2018-08-19 NOTE — Therapy (Signed)
[place] MAIN [facility] SERVICES [address] [place], [place], [address] Phone: [phone]   Fax:  [phone]  Occupational Therapy Treatment  Patient Details  Name: Kenneth Summers MRN: [id number] Date of Birth: 1945-05-24 No data recorded  Encounter Date: 08/19/2018  OT End of Session - 08/19/18 1546    Visit Number  26    Number of Visits  60    Date for OT Re-Evaluation  09/04/18    Authorization Type  Progress report period starting 06/12/2018    OT Start Time  1350    OT Stop Time  1430    OT Time Calculation (min)  40 min    Activity Tolerance  Patient tolerated treatment well    Behavior During Therapy  [facility] for tasks assessed/performed       Past Medical History:  Diagnosis Date  . Alcohol abuse   . APS (antiphospholipid syndrome) ([place])   . BPH (benign prostatic hyperplasia)   . CAD (coronary artery disease)   . Cellulitis   . Cervical spinal stenosis   . Chronic osteomyelitis ([place])   . CKD (chronic kidney disease)   . CKD (chronic kidney disease)   . Clostridium difficile diarrhea   . Depression   . Diplopia   . Fatigue   . GERD (gastroesophageal reflux disease)   . Hyperlipemia   . Hypertension   . Hypovitaminosis D   . IBS (irritable bowel syndrome)   . 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 Insomnia   . Left lumbosacral radiculopathy   . Moderate obstructive sleep apnea   . Osteomyelitis of foot ([place])   . PAD (peripheral artery disease) ([place])   . Pruritus   . RA (rheumatoid arthritis) ([place])   . Radiculopathy   . Restless legs syndrome   . RLS (restless legs syndrome)   . SLE (systemic lupus erythematosus related syndrome) ([place])   . Stroke ([place])   . Toxic maculopathy of both eyes     Past Surgical History:  Procedure Laterality Date  . CARDIAC CATHETERIZATION    . CAROTID ENDARTERECTOMY    . CERVICAL LAMINECTOMY    . CORONARY ANGIOPLASTY    . FRACTURE SURGERY    . fusion C5-6-7    . HERNIA REPAIR    . KNEE  ARTHROSCOPY    . TONSILLECTOMY      There were no vitals filed for this visit.  Subjective Assessment - 08/19/18 1545    Subjective   Pt. reports having sore bottom.    Pertinent History  Pt. is a 73 y.o. 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 Pt. PMHx includes: Multiple Falls, Lupus, DJD, Renal Abscess, CVA. Pt. resides with his wife. Pt.'s wife and daughter assist with ADLs. Pt. has caregivers in  for 2 hours a day, 6 days a week. Pt. received Rehab services in acute care, at SNF for STR, and [place] services. Pt. is retired from [employer] for [employer] and [employer].    Currently in Pain?  Yes    Pain Score  --   Not rated   Pain Location  Buttocks    Pain Descriptors / Indicators  Sore      OT TREATMENT    Therapeutic Exercise:  Pt. performed gross left gripping with the grip strengthener. Pt. worked on sustaining grip while grasping pegs and reaching at various heights. The Gripper was placed in the 3rd resistive slot initially, with the white  resistive spring then transitioned to the 2nd, and finally the 1st as the task progressed.  Neuro muscular re-education:  Pt. worked on grasping, and flipping 60 [place] style discs. Pt. required visual demonstration, and cues for movement patterns. Pt. worked on left hand fine motor coordination skills. Pt. Initiated the task with his left hand, and required cues to use his left hand throughout the whole task during the task.  Pt. Required increased time, and and cues for the discs on the far left.                        OT Education - 08/19/18 1546    Education Details  Left hand functioning    Person(s) Educated  Patient    Methods  Explanation;Demonstration    Comprehension  Verbalized understanding;Returned demonstration          OT Long Term Goals - 08/05/18 1727      OT LONG TERM GOAL #1   Title  Pt. will increase left shoulder flexion AROM by 10 degrees to  access cabinet/shelf.    Baseline  AROM left shoulder flexion: 104    Time  12    Period  Weeks    Status  On-going    Target Date  09/04/18      OT LONG TERM GOAL #2   Title  Pt. will donn a shirt with Supervision.    Baseline  Min-ModA    Time  12    Period  Weeks    Status  On-going    Target Date  09/04/18      OT LONG TERM GOAL #3   Title  Pt. will require ModA to perform LE dressing    Baseline  MaxA left, ModA with the right    Time  12    Period  Weeks    Status  On-going    Target Date  09/04/18      OT LONG TERM GOAL #4   Title  Pt. will improve left grip strength by 10# to be able to open a jar/container.    Baseline  Left: 29, Right 40    Time  12    Status  On-going    Target Date  09/04/18      OT LONG TERM GOAL #5   Title  Pt. will improve left hand [facility] skills to be able to assit with buttoning/zipping.    Baseline  Left: 1 min & 52 sec. R: 56 sec.    Time  12    Period  Weeks    Status  On-going    Target Date  09/04/18      OT LONG TERM GOAL #6   Title  Pt. will demonstrate visual conmpensatory strategies during 100% of the time during self care.    Baseline  Continue    Time  12    Period  Weeks    Status  On-going    Target Date  09/04/18      OT LONG TERM GOAL #7   Title  Pt. will prepare a simple cold snack from w/c with supervision using cognitive compensatory strategies 100% of the time.    Baseline  Pt. is unable    Time  12    Period  Weeks    Status  On-going    Target Date  09/04/18      OT LONG TERM GOAL #8   Title  Pt. will accurately identify  potential safety hazard using good safety awareness, and judgement 100% for ADLs, and IADLs.    Baseline  Continue    Time  12    Period  Weeks    Status  On-going    Target Date  09/04/18            Plan - 08/19/18 1546    Clinical Impression Statement  Pt. reports having a sore on his bottom which is more painful today. Pt. required with frequent repositioning, and  weightshifting. Pt. continues to work on improving LUE functioning, motor control, and [facility] skills in order to increase engagement in ADLSs, and IADL tasks, and improve, and promote independence.    Occupational performance deficits (Please refer to evaluation for details):  ADL's    Body Structure / Function / Physical Skills  UE functional use;Coordination;FMC;Dexterity;Strength;ROM    Cognitive Skills  Attention;Memory;Emotional;Problem Solve;Safety Awareness    Rehab Potential  Good    Clinical Decision Making  Multiple treatment options, significant modification of task necessary    Comorbidities Affecting Occupational Performance:  Presence of comorbidities impacting occupational performance    Comorbidities impacting occupational performance description:  Phyical, cognitive, visual,  medical comorbidities    Modification or Assistance to Complete Evaluation   Max significant modification of tasks or assist is necessary to complete    OT Frequency  3x / week    OT Duration  12 weeks    OT Treatment/Interventions  Self-care/ADL training;DME and/or AE instruction;Therapeutic exercise;Therapeutic activities;Moist Heat;Cognitive remediation/compensation;Neuromuscular education;Visual/perceptual remediation/compensation;Coping strategies training;Patient/family education;Passive range of motion;Psychosocial skills training;Energy conservation;Functional Mobility Training    Consulted and Agree with Plan of Care  Patient;Family member/caregiver    Family Member Consulted  Daughter [name]       Patient will benefit from skilled therapeutic intervention in order to improve the following deficits and impairments:   Body Structure / Function / Physical Skills: UE functional use, Coordination, FMC, Dexterity, Strength, ROM Cognitive Skills: Attention, Memory, Emotional, Problem Solve, Safety Awareness     Visit Diagnosis: 1. Muscle weakness (generalized)   2. Other lack of coordination        Problem List There are no active problems to display for this patient.   Harrel Carina 08/19/2018, 4:37 PM  [place] MAIN [facility] SERVICES [address] [place], [place], [address] Phone: [phone]   Fax:  [phone]  Name: Kenneth Summers MRN: [id number] Date of Birth: 1945/07/16

## 2018-08-21 ENCOUNTER — Ambulatory Visit: Payer: Medicare Other | Admitting: Physical Therapy

## 2018-08-21 ENCOUNTER — Ambulatory Visit: Payer: Medicare Other | Admitting: Occupational Therapy

## 2018-08-21 ENCOUNTER — Encounter: Payer: Self-pay | Admitting: Occupational Therapy

## 2018-08-21 ENCOUNTER — Encounter: Payer: Self-pay | Admitting: Physical Therapy

## 2018-08-21 ENCOUNTER — Other Ambulatory Visit: Payer: Self-pay

## 2018-08-21 DIAGNOSIS — R278 Other lack of coordination: Secondary | ICD-10-CM

## 2018-08-21 DIAGNOSIS — R262 Difficulty in walking, not elsewhere classified: Secondary | ICD-10-CM

## 2018-08-21 DIAGNOSIS — M6281 Muscle weakness (generalized): Secondary | ICD-10-CM | POA: Diagnosis not present

## 2018-08-21 DIAGNOSIS — R296 Repeated falls: Secondary | ICD-10-CM

## 2018-08-21 NOTE — Therapy (Signed)
[place] MAIN [facility] [address] [place], [place], [address] Phone: [phone]   Fax:  [phone]  Occupational Therapy Treatment  Patient Details  Name: Kenneth Summers MRN: [id number] Date of Birth: [birth year] No data recorded  Encounter Date: 08/21/2018  OT End of Session - 08/21/18 1611    Visit Number  27    Number of Visits  48    Date for OT Re-Evaluation  09/04/18    OT Start Time  1345    OT Stop Time  1430    OT Time Calculation (min)  45 min    Activity Tolerance  Patient tolerated treatment well    Behavior During Therapy  [facility] for tasks assessed/performed       Past Medical History:  Diagnosis Date  . Alcohol abuse   . APS (antiphospholipid syndrome) ([place])   . BPH (benign prostatic hyperplasia)   . CAD (coronary artery disease)   . Cellulitis   . Cervical spinal stenosis   . Chronic osteomyelitis ([place])   . CKD (chronic kidney disease)   . CKD (chronic kidney disease)   . Clostridium difficile diarrhea   . Depression   . Diplopia   . Fatigue   . GERD (gastroesophageal reflux disease)   . Hyperlipemia   . Hypertension   . Hypovitaminosis D   . IBS (irritable bowel syndrome)   . 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 Insomnia   . Left lumbosacral radiculopathy   . Moderate obstructive sleep apnea   . Osteomyelitis of foot ([place])   . PAD (peripheral artery disease) ([place])   . Pruritus   . RA (rheumatoid arthritis) ([place])   . Radiculopathy   . Restless legs syndrome   . RLS (restless legs syndrome)   . SLE (systemic lupus erythematosus related syndrome) ([place])   . Stroke ([place])   . Toxic maculopathy of both eyes     Past Surgical History:  Procedure Laterality Date  . CARDIAC CATHETERIZATION    . CAROTID ENDARTERECTOMY    . CERVICAL LAMINECTOMY    . CORONARY ANGIOPLASTY    . FRACTURE SURGERY    . fusion C5-6-7    . HERNIA REPAIR    . KNEE ARTHROSCOPY    . TONSILLECTOMY      There were no vitals filed  for this visit.  Subjective Assessment - 08/21/18 1609    Subjective   Pt. reports is fingers on his bilateral hands have swelling.    Pertinent History  Pt. is a 73 y.o. 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 Pt. PMHx includes: Multiple Falls, Lupus, DJD, Renal Abscess, CVA. Pt. resides with his wife. Pt.'s wife and daughter assist with ADLs. Pt. has caregivers in  for 2 hours a day, 6 days a week. Pt. received Rehab services in acute care, at SNF for STR, and [place] services. Pt. is retired from [employer] for [employer] and [employer].    Currently in Pain?  No/denies      OT TREATMENT    Neuro muscular re-education:  Pt. worked on left hand [facility] skills grasping small objects, and building a 3 piece tower. Pt. required increased verbal, and tactile cues to use his left hand during the task. Pt. worked on manipulating the 1" sticks, 1/4"collars, and 1/4"washers. Rest breaks were required, and assist for upright midline positioning and realigning hips in the chair secondary to increased leaning to the left.  Pillows  were required to maintain midline position secondary to fatigue.                           OT Education - 08/21/18 1610    Education Details  Left hand functioning    Person(s) Educated  Patient    Methods  Explanation;Demonstration    Comprehension  Verbalized understanding;Returned demonstration          OT Long Term Goals - 08/05/18 1727      OT LONG TERM GOAL #1   Title  Pt. will increase left shoulder flexion AROM by 10 degrees to access cabinet/shelf.    Baseline  AROM left shoulder flexion: 104    Time  12    Period  Weeks    Status  On-going    Target Date  09/04/18      OT LONG TERM GOAL #2   Title  Pt. will donn a shirt with Supervision.    Baseline  Min-ModA    Time  12    Period  Weeks    Status  On-going    Target Date  09/04/18      OT LONG TERM GOAL #3   Title  Pt. will require  ModA to perform LE dressing    Baseline  MaxA left, ModA with the right    Time  12    Period  Weeks    Status  On-going    Target Date  09/04/18      OT LONG TERM GOAL #4   Title  Pt. will improve left grip strength by 10# to be able to open a jar/container.    Baseline  Left: 29, Right 40    Time  12    Status  On-going    Target Date  09/04/18      OT LONG TERM GOAL #5   Title  Pt. will improve left hand [facility] skills to be able to assit with buttoning/zipping.    Baseline  Left: 1 min & 52 sec. R: 56 sec.    Time  12    Period  Weeks    Status  On-going    Target Date  09/04/18      OT LONG TERM GOAL #6   Title  Pt. will demonstrate visual conmpensatory strategies during 100% of the time during self care.    Baseline  Continue    Time  12    Period  Weeks    Status  On-going    Target Date  09/04/18      OT LONG TERM GOAL #7   Title  Pt. will prepare a simple cold snack from w/c with supervision using cognitive compensatory strategies 100% of the time.    Baseline  Pt. is unable    Time  12    Period  Weeks    Status  On-going    Target Date  09/04/18      OT LONG TERM GOAL #8   Title  Pt. will accurately identify potential safety hazard using good safety awareness, and judgement 100% for ADLs, and IADLs.    Baseline  Continue    Time  12    Period  Weeks    Status  On-going    Target Date  09/04/18            Plan - 08/21/18 1612    Clinical Impression Statement Pt. reports having sore joints today that he  attributes to holding, and pushing up from the bars when working on standing. Pt. continues to work on improving LUE strength, and [facility] skills. Pt. is improving with maintaining attention throughout the task. Pt. continues to work on improving UE strength, and [facility] skills in order to improve, and increase engagement during ADLs, and IADL tasks.    Occupational performance deficits (Please refer to evaluation for details):  ADL's    Body Structure / Function /  Physical Skills  UE functional use;Coordination;FMC;Dexterity;Strength;ROM    Cognitive Skills  Attention;Memory;Emotional;Problem Solve;Safety Awareness    Rehab Potential  Good    Clinical Decision Making  Multiple treatment options, significant modification of task necessary    Comorbidities Affecting Occupational Performance:  Presence of comorbidities impacting occupational performance    Comorbidities impacting occupational performance description:  Phyical, cognitive, visual,  medical comorbidities    Modification or Assistance to Complete Evaluation   Max significant modification of tasks or assist is necessary to complete    OT Frequency  3x / week    OT Duration  12 weeks    OT Treatment/Interventions  Self-care/ADL training;DME and/or AE instruction;Therapeutic exercise;Therapeutic activities;Moist Heat;Cognitive remediation/compensation;Neuromuscular education;Visual/perceptual remediation/compensation;Coping strategies training;Patient/family education;Passive range of motion;Psychosocial skills training;Energy conservation;Functional Mobility Training    Recommended Other Services  PT    Consulted and Agree with Plan of Care  Patient;Family member/caregiver    Family Member Consulted  Daughter [name]       Patient will benefit from skilled therapeutic intervention in order to improve the following deficits and impairments:   Body Structure / Function / Physical Skills: UE functional use, Coordination, FMC, Dexterity, Strength, ROM Cognitive Skills: Attention, Memory, Emotional, Problem Solve, Safety Awareness     Visit Diagnosis: 1. Muscle weakness (generalized)   2. Other lack of coordination       Problem List There are no active problems to display for this patient.   Harrel Carina, MS, OTR/L 08/21/2018, 5:42 PM  [place] MAIN [facility] SERVICES [address] [place], [place], [address] Phone: [phone]   Fax:   [phone]  Name: Kenneth Summers MRN: [id number] Date of Birth: 1946-01-10

## 2018-08-21 NOTE — Therapy (Signed)
[place] MAIN [facility] SERVICES [address] [place], [place], [address] Phone: [phone]   Fax:  [phone]  Physical Therapy Treatment  Patient Details  Name: Kenneth Summers MRN: [id number] Date of Birth: Oct 08, 1945 Referring Provider (PT): Dr. [name], Guy Begin   Encounter Date: 08/19/2018  PT End of Session - 08/21/18 1028    Visit Number  28    Number of Visits  36    Date for PT Re-Evaluation  09/11/18    Authorization Type  Medicare reporting period from 06/12/2018    Authorization Time Period  Current cert period 6/76/1950 - 09/11/2018    PT Start Time  1305    PT Stop Time  1346    PT Time Calculation (min)  41 min    Equipment Utilized During Treatment  Gait belt    Activity Tolerance  Patient limited by fatigue;Patient limited by pain    Behavior During Therapy  [facility] for tasks assessed/performed       Past Medical History:  Diagnosis Date  . Alcohol abuse   . APS (antiphospholipid syndrome) ([place])   . BPH (benign prostatic hyperplasia)   . CAD (coronary artery disease)   . Cellulitis   . Cervical spinal stenosis   . Chronic osteomyelitis ([place])   . CKD (chronic kidney disease)   . CKD (chronic kidney disease)   . Clostridium difficile diarrhea   . Depression   . Diplopia   . Fatigue   . GERD (gastroesophageal reflux disease)   . Hyperlipemia   . Hypertension   . Hypovitaminosis D   . IBS (irritable bowel syndrome)   . 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 Insomnia   . Left lumbosacral radiculopathy   . Moderate obstructive sleep apnea   . Osteomyelitis of foot ([place])   . PAD (peripheral artery disease) ([place])   . Pruritus   . RA (rheumatoid arthritis) ([place])   . Radiculopathy   . Restless legs syndrome   . RLS (restless legs syndrome)   . SLE (systemic lupus erythematosus related syndrome) ([place])   . Stroke ([place])   . Toxic maculopathy of both eyes     Past Surgical History:  Procedure Laterality Date  . CARDIAC  CATHETERIZATION    . CAROTID ENDARTERECTOMY    . CERVICAL LAMINECTOMY    . CORONARY ANGIOPLASTY    . FRACTURE SURGERY    . fusion C5-6-7    . HERNIA REPAIR    . KNEE ARTHROSCOPY    . TONSILLECTOMY      There were no vitals filed for this visit.  Subjective Assessment - 08/21/18 1028    Subjective  Patient reports doing well; Denies any pain;    Patient is accompained by:  Family member    Pertinent History  Patient is a 73 y.o. male who presents to outpatient physical therapy with a referral for medical diagnosis of CVA. This patient's chief complaints consist of left hemiplegia and overall deconditioning leading to the following functional deficits: dependent for ADLs, IADLs, unable to transfer to car, dependent transfers at home with hoyer lift, unable to walk or stand without significant assistance, difficulty with W/C navigation..    Limitations  Sitting;Lifting;Standing;House hold activities;Writing;Walking;Other (comment)    Diagnostic tests  Brain MRI 11/01/2017: IMPRESSION: 1. Acute right ACA territory infarct as demonstrated on prior CT imaging. No intracranial hemorrhage or significant mass effect. 2. 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    Patient Stated  Goals  wants to be able to walk again    Currently in Pain?  No/denies    Multiple Pain Sites  No          TREATMENT: Patient taken to parallel bars in wheelchair to prepare for sit<>stand transfers;  Instructed patient in forward scoot in wheelchair and provided assistance for proper foot placement and hand placement to prepare for sit to stand transfer; PT provided assistance in front of patient with blocking LLE knee for better safety and success in transfer;  Patient instructed in sit<>Stand transfers from wheelchair x3 attempts. First 2 attempts were unsuccessful with max A +2 with patient being unable to achieve full standing; On 3rd attempt patient was able to stand for  approximately 10 sec with mod-max A+2 with max VCs and tactile cues for forward weight shift and to increase hip/knee extension in standing;  Patient required mod VCs and tactile cues for proper hand placement, trunk position and overall sequencing for proper technique. He exhibits increased left lateral trunk lean with decreased forward weight shift and heavy posterior lean with each transfer attempt; Patient reports increased fatigue after 3rd stand;  Therapist then transferred pt squat pivot to Nustep with Mod A +2 with mod VCs for hand placement and positioning; Instructed patient in Nustep with BUE/BLE level 2x3 min with LLE leg attachment to prevent hip abduction/ER for better positioning; Patient instructed to keep steps per minute >60 for cardiovascular conditioning and strengthening;  During exercise patient reports increased discomfort to bottom and expressed that he had developed a rash which was possibly from a yeast infection;  Exercise discontinued and patient transferred back to wheelchair, max A +2 with cues for positioning and transfer technique;   Patient heavily fatigued at end of session; [name] admission of rash and discomfort to bottom could likely explain his limited performance with decreased forward weight shift and trouble with positioning; recommend patient follow up with physician for treatment to avoid any worsening of wound.                     PT Education - 08/21/18 1028    Education Details  transfers/positioning;    Person(s) Educated  Patient;Child(ren)    Methods  Explanation;Verbal cues;Tactile cues;Demonstration    Comprehension  Verbalized understanding;Returned demonstration;Verbal cues required;Need further instruction;Tactile cues required       PT Short Term Goals - 08/05/18 1326      PT SHORT TERM GOAL #1   Title  Be independent with initial home exercise program for self-management of symptoms.    Baseline  HEP to be given at  second session; advice to practice unsupported sitting at home (06/19/2018); 07/08/18: Performing at home, 6/29: needs assistance but is doing exercise program regularly;    Time  2    Period  Weeks    Status  On-going    Target Date  06/26/18        PT Long Term Goals - 07/30/18 1429      PT LONG TERM GOAL #1   Title  Patient will complete all bed mobility with min A to improve functional independence for getting in and out of bed and adjusting in bed.     Baseline  max A - total A reported by family (06/19/2018); 07/08/18: still requires maxA, dtr reports improvement in rolling since starting therapy; 6/23: min A to supervision in clinic; not doing at home right    Time  12    Period  Weeks    Status  On-going    Target Date  09/11/18      PT LONG TERM GOAL #2   Title  Patient will complete sit <> stand transfer chair to chair with Min A and LRAD to improve functional independence for household and community mobility.     Baseline  max A +2 STS at W/C (06/12/2018); max A +2-3 STS or stand pivot transfer, able to complete more reps (06/19/2018); 07/08/18: not safe to attempt at this time    Time  12    Period  Weeks    Status  On-going    Target Date  09/11/18      PT LONG TERM GOAL #3   Title  Patient will navigate manual w/c with min A x 100 feet to improve mobility for household and short community distances.     Baseline  required total A (06/12/2018); able to roll 20 feet with min A (06/20/2018); 07/08/18: Pt can go approximately 10' without assist per family;    Time  12    Period  Weeks    Status  On-going    Target Date  09/11/18      PT LONG TERM GOAL #4   Title  Patient will complete car transfers W/C to family SUV with min A using LRAD to improve his abilty to participate in community activities.     Baseline  unable to complete car transfers at all (06/12/2018); unable to complete car transfers at all (06/19/2018).; 07/08/18: Unable to perform at this time    Time  12    Period  Weeks     Status  On-going    Target Date  09/11/18      PT LONG TERM GOAL #5   Title  Patient will ambulate at least 150 feet with mod I using LRAD to improve mobility for household and community distances.     Baseline  unable to take steps (06/12/2018); able to shift R leg forward and back with max A and RW at edge of plinth (06/19/2018); 07/08/18: unable to ambulate at this time.     Time  12    Period  Weeks    Status  On-going    Target Date  09/11/18      Additional Long Term Goals   Additional Long Term Goals  Yes      PT LONG TERM GOAL #6   Title  Patient will navigate 4 steps with min A and BUE support to improve his ability to access community and social participation.     Baseline  unable to complet stairs (06/12/2018); unable to complete stairs (06/19/2018); 07/08/18: unable to attempt at this time    Time  12    Period  Weeks    Status  On-going    Target Date  09/11/18      PT LONG TERM GOAL #7   Title  Patient will increase functional reach test to >15 inches in sitting to exhibit improved sitting balance and positioning and reduce fall risk;    Baseline  07/30/18: 12 inches    Time  4    Period  Weeks    Status  New    Target Date  09/11/18            Plan - 08/21/18 1029    Clinical Impression Statement  Patient late to session; He initially reported feeling well with no pain at start of session. During session, patient had significant  difficulty with sit<>Stand transfers with poor trunk position and decreased forward lean. Towards end of session, patient admitted that he had some redness on his bottom as a result of possible yeast infection. His lack of forward trunk lean and decreased weight shift forward on hips could be explained by the discomfort felt from the wound. Patient would benefit from additional skilled PT Intervention to work on strength, transfers and mobility; If patient still has wounds, consider mat exercise next session to reduce discomfort;    Personal  Factors and Comorbidities  Age;Comorbidity 3+    Comorbidities  Relevant past medical history and comorbidities include long term steroid use for lupus, CKD, chronic osteomyelitis, cervical spine stenosis, BPH, APS, alcohol abuse, peripheral artery disease, depression, GERD, obstructive sleep apnea, HTN, IBS, IgA deficiency, lumbar radiculopathy, RA, Stroke, toxic maculopathy in both eyes, systemic lupus erythematosus related syndrome, cardiac catheterization, carotid endarterectomy, cervical laminectomy, coronary angioplasty, Fusion C5-C7, knee arthroplasty, lumbar surgery.    Examination-Activity Limitations  Bed Mobility;Bend;Sit;Toileting;Stand;Stairs;Lift;Transfers;Squat;Locomotion Level;Carry;Dressing;Hygiene/Grooming;Continence    Examination-Participation Restrictions  Yard Work;Interpersonal Relationship;Community Activity    Stability/Clinical Decision Making  Evolving/Moderate complexity    Rehab Potential  Fair    PT Frequency  3x / week    PT Duration  12 weeks    PT Treatment/Interventions  ADLs/Self Care Home Management;Electrical Stimulation;Moist Heat;Cryotherapy;Gait training;Stair training;Functional mobility training;Therapeutic exercise;Balance training;Neuromuscular re-education;Cognitive remediation;Patient/family education;Orthotic Fit/Training;Wheelchair mobility training;Manual techniques;Passive range of motion;Energy conservation;Joint Manipulations    PT Next Visit Plan  Work on strength, sitting/standing balance, functional activities such as rolling, bed mobility, transfers    PT Home Exercise Plan  No updates today    Consulted and Agree with Plan of Care  Patient;Family member/caregiver    Family Member Consulted  daugher [name]       Patient will benefit from skilled therapeutic intervention in order to improve the following deficits and impairments:  Abnormal gait, Decreased activity tolerance, Decreased cognition, Decreased endurance, Decreased knowledge of use of  DME, Decreased range of motion, Decreased skin integrity, Decreased strength, Impaired perceived functional ability, Impaired sensation, Impaired UE functional use, Improper body mechanics, Pain, Cardiopulmonary status limiting activity, Decreased balance, Decreased coordination, Decreased mobility, Difficulty walking, Impaired tone, Postural dysfunction  Visit Diagnosis: 1. Muscle weakness (generalized)   2. Other lack of coordination   3. Difficulty in walking, not elsewhere classified        Problem List There are no active problems to display for this patient.   Trotter,Margaret PT, DPT 08/21/2018, 10:31 AM  [place] MAIN [facility] SERVICES [address] [place], [place], [address] Phone: [phone]   Fax:  [phone]  Name: Kenneth Summers MRN: [id number] Date of Birth: [birth year]

## 2018-08-22 ENCOUNTER — Encounter: Payer: Self-pay | Admitting: Occupational Therapy

## 2018-08-22 ENCOUNTER — Encounter: Payer: Self-pay | Admitting: Physical Therapy

## 2018-08-22 ENCOUNTER — Ambulatory Visit: Payer: Medicare Other | Admitting: Physical Therapy

## 2018-08-22 ENCOUNTER — Ambulatory Visit: Payer: Medicare Other | Admitting: Occupational Therapy

## 2018-08-22 ENCOUNTER — Other Ambulatory Visit: Payer: Self-pay

## 2018-08-22 DIAGNOSIS — M6281 Muscle weakness (generalized): Secondary | ICD-10-CM

## 2018-08-22 DIAGNOSIS — R41841 Cognitive communication deficit: Secondary | ICD-10-CM

## 2018-08-22 DIAGNOSIS — R278 Other lack of coordination: Secondary | ICD-10-CM

## 2018-08-22 DIAGNOSIS — R296 Repeated falls: Secondary | ICD-10-CM

## 2018-08-22 DIAGNOSIS — R262 Difficulty in walking, not elsewhere classified: Secondary | ICD-10-CM

## 2018-08-22 NOTE — Therapy (Signed)
[place] MAIN [facility] SERVICES [address] [place], [place], [address] Phone: [phone]   Fax:  [phone]  Physical Therapy Treatment  Patient Details  Name: Kenneth Summers MRN: [id number] Date of Birth: 1945/07/10 Referring Provider (PT): Dr. [name], Guy Begin   Encounter Date: 08/21/2018  PT End of Session - 08/22/18 1547    Visit Number  29    Number of Visits  36    Date for PT Re-Evaluation  09/11/18    Authorization Type  Medicare reporting period from 06/12/2018    Authorization Time Period  Current cert period 8/88/7579 - 09/11/2018    PT Start Time  1302    PT Stop Time  1345    PT Time Calculation (min)  43 min    Equipment Utilized During Treatment  Gait belt    Activity Tolerance  Patient limited by fatigue;Patient limited by pain    Behavior During Therapy  [facility] for tasks assessed/performed       Past Medical History:  Diagnosis Date  . Alcohol abuse   . APS (antiphospholipid syndrome) ([place])   . BPH (benign prostatic hyperplasia)   . CAD (coronary artery disease)   . Cellulitis   . Cervical spinal stenosis   . Chronic osteomyelitis ([place])   . CKD (chronic kidney disease)   . CKD (chronic kidney disease)   . Clostridium difficile diarrhea   . Depression   . Diplopia   . Fatigue   . GERD (gastroesophageal reflux disease)   . Hyperlipemia   . Hypertension   . Hypovitaminosis D   . IBS (irritable bowel syndrome)   . 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 Insomnia   . Left lumbosacral radiculopathy   . Moderate obstructive sleep apnea   . Osteomyelitis of foot ([place])   . PAD (peripheral artery disease) ([place])   . Pruritus   . RA (rheumatoid arthritis) ([place])   . Radiculopathy   . Restless legs syndrome   . RLS (restless legs syndrome)   . SLE (systemic lupus erythematosus related syndrome) ([place])   . Stroke ([place])   . Toxic maculopathy of both eyes     Past Surgical History:  Procedure Laterality Date  . CARDIAC  CATHETERIZATION    . CAROTID ENDARTERECTOMY    . CERVICAL LAMINECTOMY    . CORONARY ANGIOPLASTY    . FRACTURE SURGERY    . fusion C5-6-7    . HERNIA REPAIR    . KNEE ARTHROSCOPY    . TONSILLECTOMY      There were no vitals filed for this visit.  Subjective Assessment - 08/22/18 1546    Subjective  Patient reports doing well; he reports his skin is clearing and he is not as sore on his bottom.    Patient is accompained by:  Family member    Pertinent History  Patient is a 73 y.o. male who presents to outpatient physical therapy with a referral for medical diagnosis of CVA. This patient's chief complaints consist of left hemiplegia and overall deconditioning leading to the following functional deficits: dependent for ADLs, IADLs, unable to transfer to car, dependent transfers at home with hoyer lift, unable to walk or stand without significant assistance, difficulty with W/C navigation..    Limitations  Sitting;Lifting;Standing;House hold activities;Writing;Walking;Other (comment)    Diagnostic tests  Brain MRI 11/01/2017: IMPRESSION: 1. Acute right ACA territory infarct as demonstrated on prior CT imaging. No intracranial hemorrhage or significant mass effect. 2. 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    Patient Stated Goals  wants to be able to walk again    Currently in Pain?  No/denies         TREATMENT: Patient instructed in sit<>stand transfers in parallel bars: Patient required mod A +2 for sit<>stand transfer with therapist blocking left knee and cues for forward weight shift and proper hand placement. Instructed patient to keep hands back and elbows up to facilitate increased tricep press and forward weight shift rather than pulling self forward. Patient able to stand for 3 min, 1.5 min, 1 min x2 and 30 sec respectively;  He does fatigue with prolonged standing; Upon standing, PT instructed patient to increase forward weight shift and to  increase hip/knee extension for better standing position. Patient fatigues quickly with increase flexed position requiring cues for extension.   Patient educated in proper positioning and hand placement for better transfer ability; Patient able to transfer better with right hand pushing on wheelchair and LUE on parallel bars and then standing and moving RUE to parallel bars;  Patient does fatigue quickly requiring short seated rest breaks;  Reinforced HEP:                       PT Education - 08/21/18 1520    Education Details  transfers/positioning;    Person(s) Educated  Patient    Methods  Explanation;Verbal cues    Comprehension  Verbalized understanding;Returned demonstration;Verbal cues required;Need further instruction       PT Short Term Goals - 08/22/18 1326      PT SHORT TERM GOAL #1   Title  Be independent with initial home exercise program for self-management of symptoms.    Baseline  HEP to be given at second session; advice to practice unsupported sitting at home (06/19/2018); 07/08/18: Performing at home, 6/29: needs assistance but is doing exercise program regularly; 08/22/18: adherent;    Time  2    Period  Weeks    Status  Achieved    Target Date  06/26/18        PT Long Term Goals - 08/22/18 1326      PT LONG TERM GOAL #1   Title  Patient will complete all bed mobility with min A to improve functional independence for getting in and out of bed and adjusting in bed.     Baseline  max A - total A reported by family (06/19/2018); 07/08/18: still requires maxA, dtr reports improvement in rolling since starting therapy; 6/23: min A to supervision in clinic; not doing at home right    Time  12    Period  Weeks    Status  Partially Met      PT LONG TERM GOAL #2   Title  Patient will complete sit <> stand transfer chair to chair with Min A and LRAD to improve functional independence for household and community mobility.     Baseline  max A +2 STS at W/C  (06/12/2018); max A +2-3 STS or stand pivot transfer, able to complete more reps (06/19/2018); 07/08/18: not safe to attempt at this time    Time  12    Period  Weeks    Status  On-going      PT LONG TERM GOAL #3   Title  Patient will navigate manual w/c with min A x 100 feet to improve mobility for household and short community distances.     Baseline  required total  A (06/12/2018); able to roll 20 feet with min A (06/20/2018); 07/08/18: Pt can go approximately 10' without assist per family;    Time  12    Period  Weeks    Status  On-going      PT LONG TERM GOAL #4   Title  Patient will complete car transfers W/C to family SUV with min A using LRAD to improve his abilty to participate in community activities.     Baseline  unable to complete car transfers at all (06/12/2018); unable to complete car transfers at all (06/19/2018).; 07/08/18: Unable to perform at this time    Time  12    Period  Weeks    Status  On-going      PT LONG TERM GOAL #5   Title  Patient will ambulate at least 150 feet with mod I using LRAD to improve mobility for household and community distances.     Baseline  unable to take steps (06/12/2018); able to shift R leg forward and back with max A and RW at edge of plinth (06/19/2018); 07/08/18: unable to ambulate at this time.     Time  12    Period  Weeks    Status  On-going      PT LONG TERM GOAL #6   Title  Patient will navigate 4 steps with min A and BUE support to improve his ability to access community and social participation.     Baseline  unable to complet stairs (06/12/2018); unable to complete stairs (06/19/2018); 07/08/18: unable to attempt at this time    Time  12    Period  Weeks    Status  On-going      PT LONG TERM GOAL #7   Title  Patient will increase functional reach test to >15 inches in sitting to exhibit improved sitting balance and positioning and reduce fall risk;    Baseline  07/30/18: 12 inches    Time  4    Period  Weeks    Status  New             Plan - 08/22/18 1547    Clinical Impression Statement  Patient motivated and tolerated session well. Able to progress sit<> stand transfers in parallel bars to min A +2 with better hand placement and improved standing posture. Patient continues to require min A +2 for optimum weight shift for better stance control. Patient does fatigue with prolonged standing. Patient would benefit from additional skilled PT intervention to improve strength and mobility;    Personal Factors and Comorbidities  Age;Comorbidity 3+    Comorbidities  Relevant past medical history and comorbidities include long term steroid use for lupus, CKD, chronic osteomyelitis, cervical spine stenosis, BPH, APS, alcohol abuse, peripheral artery disease, depression, GERD, obstructive sleep apnea, HTN, IBS, IgA deficiency, lumbar radiculopathy, RA, Stroke, toxic maculopathy in both eyes, systemic lupus erythematosus related syndrome, cardiac catheterization, carotid endarterectomy, cervical laminectomy, coronary angioplasty, Fusion C5-C7, knee arthroplasty, lumbar surgery.    Examination-Activity Limitations  Bed Mobility;Bend;Sit;Toileting;Stand;Stairs;Lift;Transfers;Squat;Locomotion Level;Carry;Dressing;Hygiene/Grooming;Continence    Examination-Participation Restrictions  Yard Work;Interpersonal Relationship;Community Activity    Stability/Clinical Decision Making  Evolving/Moderate complexity    Rehab Potential  Fair    PT Frequency  3x / week    PT Duration  12 weeks    PT Treatment/Interventions  ADLs/Self Care Home Management;Electrical Stimulation;Moist Heat;Cryotherapy;Gait training;Stair training;Functional mobility training;Therapeutic exercise;Balance training;Neuromuscular re-education;Cognitive remediation;Patient/family education;Orthotic Fit/Training;Wheelchair mobility training;Manual techniques;Passive range of motion;Energy conservation;Joint Manipulations    PT Next Visit  Plan  Work on strength,  sitting/standing balance, functional activities such as rolling, bed mobility, transfers    PT Home Exercise Plan  No updates today    Consulted and Agree with Plan of Care  Patient;Family member/caregiver    Family Member Consulted  daugher [name]       Patient will benefit from skilled therapeutic intervention in order to improve the following deficits and impairments:  Abnormal gait, Decreased activity tolerance, Decreased cognition, Decreased endurance, Decreased knowledge of use of DME, Decreased range of motion, Decreased skin integrity, Decreased strength, Impaired perceived functional ability, Impaired sensation, Impaired UE functional use, Improper body mechanics, Pain, Cardiopulmonary status limiting activity, Decreased balance, Decreased coordination, Decreased mobility, Difficulty walking, Impaired tone, Postural dysfunction  Visit Diagnosis: 1. Muscle weakness (generalized)   2. Other lack of coordination   3. Difficulty in walking, not elsewhere classified   4. Repeated falls        Problem List There are no active problems to display for this patient.   Trotter,Margaret PT, DPT 08/22/2018, 3:49 PM  [place] MAIN [facility] SERVICES [address] [place], [place], [address] Phone: [phone]   Fax:  [phone]  Name: Kenneth Summers MRN: [id number] Date of Birth: [birth year]

## 2018-08-22 NOTE — Therapy (Signed)
[place] MAIN [facility] SERVICES [address] [place], [place], [address] Phone: [phone]   Fax:  [phone]  Physical Therapy Treatment Physical Therapy Progress Note   Dates of reporting period  07/30/18   to  08/22/18  Patient Details  Name: Kenneth Summers MRN: [id number] Date of Birth: 09/15/1945 Referring Provider (PT): Dr. [name], Guy Begin   Encounter Date: 08/22/2018  PT End of Session - 08/22/18 1605    Visit Number  30    Number of Visits  36    Date for PT Re-Evaluation  09/11/18    Authorization Type  Medicare reporting period from 06/12/2018    Authorization Time Period  Current cert period 10/28/1939 - 09/11/2018    PT Start Time  1302    PT Stop Time  1345    PT Time Calculation (min)  43 min    Equipment Utilized During Treatment  Gait belt    Activity Tolerance  Patient limited by fatigue;Patient limited by pain    Behavior During Therapy  [facility] for tasks assessed/performed       Past Medical History:  Diagnosis Date  . Alcohol abuse   . APS (antiphospholipid syndrome) ([place])   . BPH (benign prostatic hyperplasia)   . CAD (coronary artery disease)   . Cellulitis   . Cervical spinal stenosis   . Chronic osteomyelitis ([place])   . CKD (chronic kidney disease)   . CKD (chronic kidney disease)   . Clostridium difficile diarrhea   . Depression   . Diplopia   . Fatigue   . GERD (gastroesophageal reflux disease)   . Hyperlipemia   . Hypertension   . Hypovitaminosis D   . IBS (irritable bowel syndrome)   . 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 Insomnia   . Left lumbosacral radiculopathy   . Moderate obstructive sleep apnea   . Osteomyelitis of foot ([place])   . PAD (peripheral artery disease) ([place])   . Pruritus   . RA (rheumatoid arthritis) ([place])   . Radiculopathy   . Restless legs syndrome   . RLS (restless legs syndrome)   . SLE (systemic lupus erythematosus related syndrome) ([place])   . Stroke ([place])   . Toxic maculopathy  of both eyes     Past Surgical History:  Procedure Laterality Date  . CARDIAC CATHETERIZATION    . CAROTID ENDARTERECTOMY    . CERVICAL LAMINECTOMY    . CORONARY ANGIOPLASTY    . FRACTURE SURGERY    . fusion C5-6-7    . HERNIA REPAIR    . KNEE ARTHROSCOPY    . TONSILLECTOMY      There were no vitals filed for this visit.  Subjective Assessment - 08/22/18 1604    Subjective  Patient reports doing well; Denies any pain; Reports some fatigue after yesterday;    Patient is accompained by:  Family member    Pertinent History  Patient is a 73 y.o. male who presents to outpatient physical therapy with a referral for medical diagnosis of CVA. This patient's chief complaints consist of left hemiplegia and overall deconditioning leading to the following functional deficits: dependent for ADLs, IADLs, unable to transfer to car, dependent transfers at home with hoyer lift, unable to walk or stand without significant assistance, difficulty with W/C navigation..    Limitations  Sitting;Lifting;Standing;House hold activities;Writing;Walking;Other (comment)    Diagnostic tests  Brain MRI 11/01/2017: IMPRESSION: 1. Acute right ACA territory infarct as demonstrated on prior CT imaging. No intracranial  hemorrhage or significant mass effect. 2. 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    Patient Stated Goals  wants to be able to walk again    Currently in Pain?  No/denies            TREATMENT: Patient instructed in sit<>stand transfers in parallel bars: Patient required mod A +2 progressing to Min A +1 for sit<>stand transfer with therapist blocking left knee and cues for forward weight shift and proper hand placement. Instructed patient to keep hands back and elbows up to facilitate increased tricep press and forward weight shift rather than pulling self forward. Patient able to stand for 5 min, 1.5 min, 1 min x2 and 30 sec respectively;  He does fatigue with  prolonged standing; Upon standing, PT instructed patient to increase forward weight shift and to increase hip/knee extension for better standing position. Patient fatigues quickly with increase flexed position requiring cues for extension.   Patient educated in proper positioning and hand placement for better transfer ability; Patient able to transfer better with right hand pushing on wheelchair and LUE on parallel bars and then standing and moving RUE to parallel bars;  Patient does fatigue quickly requiring short seated rest breaks;  Reinforced HEP:   Response to treatment:  Patient able to achieve better transfer ability with improved activation and posture. Patient able to stand for 5 min with min A +2; He was able to exhibit improved extension with better standing posture and control. Patient was educated in standing posture and positioning progressing to reducing assistance in standing to challenge standing balance. Patient only able to stand for 2 sec unsupported with heavy flexed posture with sitting position. [name] condition has the potential to improve in response to therapy. Maximum improvement is yet to be obtained. The anticipated improvement is attainable and reasonable in a generally predictable time.  Patient reports improved overall mobility.                                PT Education - 08/22/18 1325    Education Details  transfers/positioning;    Person(s) Educated  Patient    Methods  Explanation;Verbal cues    Comprehension  Verbalized understanding;Returned demonstration;Verbal cues required;Need further instruction       PT Short Term Goals - 08/22/18 1326      PT SHORT TERM GOAL #1   Title  Be independent with initial home exercise program for self-management of symptoms.    Baseline  HEP to be given at second session; advice to practice unsupported sitting at home (06/19/2018); 07/08/18: Performing at home, 6/29: needs assistance but is  doing exercise program regularly; 08/22/18: adherent;    Time  2    Period  Weeks    Status  Achieved    Target Date  06/26/18        PT Long Term Goals - 08/22/18 1326      PT LONG TERM GOAL #1   Title  Patient will complete all bed mobility with min A to improve functional independence for getting in and out of bed and adjusting in bed.     Baseline  max A - total A reported by family (06/19/2018); 07/08/18: still requires maxA, dtr reports improvement in rolling since starting therapy; 6/23: min A to supervision in clinic; not doing at home right    Time  12    Period  Weeks  Status  Partially Met    Target Date  09/11/18      PT LONG TERM GOAL #2   Title  Patient will complete sit <> stand transfer chair to chair with Min A and LRAD to improve functional independence for household and community mobility.     Baseline  max A +2 STS at W/C (06/12/2018); max A +2-3 STS or stand pivot transfer, able to complete more reps (06/19/2018); 07/08/18: not safe to attempt at this time    Time  12    Period  Weeks    Status  On-going    Target Date  09/11/18      PT LONG TERM GOAL #3   Title  Patient will navigate manual w/c with min A x 100 feet to improve mobility for household and short community distances.     Baseline  required total A (06/12/2018); able to roll 20 feet with min A (06/20/2018); 07/08/18: Pt can go approximately 10' without assist per family;    Time  12    Period  Weeks    Status  On-going    Target Date  09/11/18      PT LONG TERM GOAL #4   Title  Patient will complete car transfers W/C to family SUV with min A using LRAD to improve his abilty to participate in community activities.     Baseline  unable to complete car transfers at all (06/12/2018); unable to complete car transfers at all (06/19/2018).; 07/08/18: Unable to perform at this time    Time  12    Period  Weeks    Status  On-going    Target Date  09/11/18      PT LONG TERM GOAL #5   Title  Patient will ambulate at  least 150 feet with mod I using LRAD to improve mobility for household and community distances.     Baseline  unable to take steps (06/12/2018); able to shift R leg forward and back with max A and RW at edge of plinth (06/19/2018); 07/08/18: unable to ambulate at this time.     Time  12    Period  Weeks    Status  On-going    Target Date  09/11/18      PT LONG TERM GOAL #6   Title  Patient will navigate 4 steps with min A and BUE support to improve his ability to access community and social participation.     Baseline  unable to complet stairs (06/12/2018); unable to complete stairs (06/19/2018); 07/08/18: unable to attempt at this time    Time  12    Period  Weeks    Status  On-going    Target Date  09/11/18      PT LONG TERM GOAL #7   Title  Patient will increase functional reach test to >15 inches in sitting to exhibit improved sitting balance and positioning and reduce fall risk;    Baseline  07/30/18: 12 inches    Time  4    Period  Weeks    Status  Partially Met    Target Date  09/11/18            Plan - 08/22/18 1605    Clinical Impression Statement  Patient motivated and tolerated session well. He was instructed in sit<>stand transfers in parallel bars. He is able to progress standing duration as well as transfer ability progressing to min A +1 for sit<>Stand and standing unsupported for 1-2 sec.  Patient is adherent with HEP. He does continue to have weakness in LLE which limits overall mobility. he would benefit from additional skilled PT intervention to improve strength, balance and mobility;    Personal Factors and Comorbidities  Age;Comorbidity 3+    Comorbidities  Relevant past medical history and comorbidities include long term steroid use for lupus, CKD, chronic osteomyelitis, cervical spine stenosis, BPH, APS, alcohol abuse, peripheral artery disease, depression, GERD, obstructive sleep apnea, HTN, IBS, IgA deficiency, lumbar radiculopathy, RA, Stroke, toxic maculopathy in both  eyes, systemic lupus erythematosus related syndrome, cardiac catheterization, carotid endarterectomy, cervical laminectomy, coronary angioplasty, Fusion C5-C7, knee arthroplasty, lumbar surgery.    Examination-Activity Limitations  Bed Mobility;Bend;Sit;Toileting;Stand;Stairs;Lift;Transfers;Squat;Locomotion Level;Carry;Dressing;Hygiene/Grooming;Continence    Examination-Participation Restrictions  Yard Work;Interpersonal Relationship;Community Activity    Stability/Clinical Decision Making  Evolving/Moderate complexity    Rehab Potential  Fair    PT Frequency  3x / week    PT Duration  12 weeks    PT Treatment/Interventions  ADLs/Self Care Home Management;Electrical Stimulation;Moist Heat;Cryotherapy;Gait training;Stair training;Functional mobility training;Therapeutic exercise;Balance training;Neuromuscular re-education;Cognitive remediation;Patient/family education;Orthotic Fit/Training;Wheelchair mobility training;Manual techniques;Passive range of motion;Energy conservation;Joint Manipulations    PT Next Visit Plan  Work on strength, sitting/standing balance, functional activities such as rolling, bed mobility, transfers    PT Home Exercise Plan  No updates today    Consulted and Agree with Plan of Care  Patient;Family member/caregiver    Family Member Consulted  daugher [name]       Patient will benefit from skilled therapeutic intervention in order to improve the following deficits and impairments:  Abnormal gait, Decreased activity tolerance, Decreased cognition, Decreased endurance, Decreased knowledge of use of DME, Decreased range of motion, Decreased skin integrity, Decreased strength, Impaired perceived functional ability, Impaired sensation, Impaired UE functional use, Improper body mechanics, Pain, Cardiopulmonary status limiting activity, Decreased balance, Decreased coordination, Decreased mobility, Difficulty walking, Impaired tone, Postural dysfunction  Visit Diagnosis: 1. Muscle  weakness (generalized)   2. Other lack of coordination   3. Difficulty in walking, not elsewhere classified   4. Repeated falls        Problem List There are no active problems to display for this patient.   Galo Sayed PT, DPT 08/22/2018, 4:09 PM  [place] MAIN [facility] SERVICES [address] [place], [place], [address] Phone: [phone]   Fax:  [phone]  Name: Kenneth Summers MRN: [id number] Date of Birth: 02-13-1945

## 2018-08-22 NOTE — Therapy (Addendum)
[place] MAIN [facility] SERVICES [address] [place], [place], [address] Phone: [phone]   Fax:  [phone]  Occupational Therapy Treatment  Patient Details  Name: Kenneth Summers MRN: [id number] Date of Birth: May 12, 1945 No data recorded  Encounter Date: 08/22/2018  OT End of Session - 08/22/18 1453    Visit Number  28    Number of Visits  48    Date for OT Re-Evaluation  09/04/18    OT Start Time  1345    OT Stop Time  1430    OT Time Calculation (min)  45 min    Activity Tolerance  Patient tolerated treatment well    Behavior During Therapy  [facility] for tasks assessed/performed       Past Medical History:  Diagnosis Date  . Alcohol abuse   . APS (antiphospholipid syndrome) ([place])   . BPH (benign prostatic hyperplasia)   . CAD (coronary artery disease)   . Cellulitis   . Cervical spinal stenosis   . Chronic osteomyelitis ([place])   . CKD (chronic kidney disease)   . CKD (chronic kidney disease)   . Clostridium difficile diarrhea   . Depression   . Diplopia   . Fatigue   . GERD (gastroesophageal reflux disease)   . Hyperlipemia   . Hypertension   . Hypovitaminosis D   . IBS (irritable bowel syndrome)   . 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 Insomnia   . Left lumbosacral radiculopathy   . Moderate obstructive sleep apnea   . Osteomyelitis of foot ([place])   . PAD (peripheral artery disease) ([place])   . Pruritus   . RA (rheumatoid arthritis) ([place])   . Radiculopathy   . Restless legs syndrome   . RLS (restless legs syndrome)   . SLE (systemic lupus erythematosus related syndrome) ([place])   . Stroke ([place])   . Toxic maculopathy of both eyes     Past Surgical History:  Procedure Laterality Date  . CARDIAC CATHETERIZATION    . CAROTID ENDARTERECTOMY    . CERVICAL LAMINECTOMY    . CORONARY ANGIOPLASTY    . FRACTURE SURGERY    . fusion C5-6-7    . HERNIA REPAIR    . KNEE ARTHROSCOPY    . TONSILLECTOMY      There were no vitals filed  for this visit.  Subjective Assessment - 08/22/18 1452    Subjective   Pt. reports finger swelling has improved    Patient is accompanied by:  Family member    Pertinent History  Pt. is a 73 y.o. 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 Pt. PMHx includes: Multiple Falls, Lupus, DJD, Renal Abscess, CVA. Pt. resides with his wife. Pt.'s wife and daughter assist with ADLs. Pt. has caregivers in  for 2 hours a day, 6 days a week. Pt. received Rehab services in acute care, at SNF for STR, and [facility]. Pt. is retired from [employer] for [employer] and [employer].    Patient Stated Goals  To regain use of his left UE, and do more for himself.    Currently in Pain?  No/denies      OT TREATMENT    Selfcare:  Pt. worked on Industrial/product designer calendars. Pt. worked to identify incorrect items on a calendar. Pt. was able to identify 80% of items wrong on a monthly calendar with increased time, and verbal cues. Pt. worked on filling out a blank calendar setting  up dates, months, and events. Pt. was able to set the calendar up. Pt. with decreased spacing when filling in the small daily squares, and fitting multiple items on the calendar.                         OT Education - 08/22/18 1453    Education Details  Left hand functioning    Person(s) Educated  Patient    Methods  Explanation;Demonstration    Comprehension  Verbalized understanding;Returned demonstration          OT Long Term Goals - 08/05/18 1727      OT LONG TERM GOAL #1   Title  Pt. will increase left shoulder flexion AROM by 10 degrees to access cabinet/shelf.    Baseline  AROM left shoulder flexion: 104    Time  12    Period  Weeks    Status  On-going    Target Date  09/04/18      OT LONG TERM GOAL #2   Title  Pt. will donn a shirt with Supervision.    Baseline  Min-ModA    Time  12    Period  Weeks    Status  On-going    Target Date  09/04/18      OT  LONG TERM GOAL #3   Title  Pt. will require ModA to perform LE dressing    Baseline  MaxA left, ModA with the right    Time  12    Period  Weeks    Status  On-going    Target Date  09/04/18      OT LONG TERM GOAL #4   Title  Pt. will improve left grip strength by 10# to be able to open a jar/container.    Baseline  Left: 29, Right 40    Time  12    Status  On-going    Target Date  09/04/18      OT LONG TERM GOAL #5   Title  Pt. will improve left hand [facility] skills to be able to assit with buttoning/zipping.    Baseline  Left: 1 min & 52 sec. R: 56 sec.    Time  12    Period  Weeks    Status  On-going    Target Date  09/04/18      OT LONG TERM GOAL #6   Title  Pt. will demonstrate visual conmpensatory strategies during 100% of the time during self care.    Baseline  Continue    Time  12    Period  Weeks    Status  On-going    Target Date  09/04/18      OT LONG TERM GOAL #7   Title  Pt. will prepare a simple cold snack from w/c with supervision using cognitive compensatory strategies 100% of the time.    Baseline  Pt. is unable    Time  12    Period  Weeks    Status  On-going    Target Date  09/04/18      OT LONG TERM GOAL #8   Title  Pt. will accurately identify potential safety hazard using good safety awareness, and judgement 100% for ADLs, and IADLs.    Baseline  Continue    Time  12    Period  Weeks    Status  On-going    Target Date  09/04/18  Plan - 08/22/18 1453    Clinical Impression Statement Pt. presented with improved attention throughout the duration of the task, requiring fewer cues for redirection. Pt. was able to sustain attention when challenged with environmental stimulation. Pt. continues to work on improvng attention, and Left sided awareness, and LUE functioning during ADLs, and IADL functioning.    Occupational performance deficits (Please refer to evaluation for details):  ADL's    Body Structure / Function / Physical Skills  UE  functional use;Coordination;FMC;Dexterity;Strength;ROM    Cognitive Skills  Attention;Memory;Emotional;Problem Solve;Safety Awareness    Rehab Potential  Good    Clinical Decision Making  Multiple treatment options, significant modification of task necessary    Comorbidities impacting occupational performance description:  Phyical, cognitive, visual,  medical comorbidities    Modification or Assistance to Complete Evaluation   Max significant modification of tasks or assist is necessary to complete    OT Frequency  3x / week    OT Duration  12 weeks    OT Treatment/Interventions  Self-care/ADL training;DME and/or AE instruction;Therapeutic exercise;Therapeutic activities;Moist Heat;Cognitive remediation/compensation;Neuromuscular education;Visual/perceptual remediation/compensation;Coping strategies training;Patient/family education;Passive range of motion;Psychosocial skills training;Energy conservation;Functional Mobility Training    Consulted and Agree with Plan of Care  Patient;Family member/caregiver    Family Member Consulted  Daughter [name]       Patient will benefit from skilled therapeutic intervention in order to improve the following deficits and impairments:   Body Structure / Function / Physical Skills: UE functional use, Coordination, FMC, Dexterity, Strength, ROM Cognitive Skills: Attention, Memory, Emotional, Problem Solve, Safety Awareness     Visit Diagnosis: 1. Muscle weakness (generalized)   2. Other lack of coordination   3. Cognitive communication deficit       Problem List There are no active problems to display for this patient.   Harrel Carina, MS, OTR/L 08/22/2018, 2:58 PM  [place] MAIN [facility] SERVICES [address] [place], [place], [address] Phone: [phone]   Fax:  [phone]  Name: Kenneth Summers MRN: [id number] Date of Birth: [birth year]

## 2018-08-26 ENCOUNTER — Encounter: Payer: Self-pay | Admitting: Occupational Therapy

## 2018-08-26 ENCOUNTER — Other Ambulatory Visit: Payer: Self-pay

## 2018-08-26 ENCOUNTER — Ambulatory Visit: Payer: Medicare Other | Admitting: Physical Therapy

## 2018-08-26 ENCOUNTER — Ambulatory Visit: Payer: Medicare Other | Admitting: Occupational Therapy

## 2018-08-26 ENCOUNTER — Encounter: Payer: Self-pay | Admitting: Physical Therapy

## 2018-08-26 DIAGNOSIS — R296 Repeated falls: Secondary | ICD-10-CM

## 2018-08-26 DIAGNOSIS — M6281 Muscle weakness (generalized): Secondary | ICD-10-CM

## 2018-08-26 DIAGNOSIS — R262 Difficulty in walking, not elsewhere classified: Secondary | ICD-10-CM

## 2018-08-26 DIAGNOSIS — R278 Other lack of coordination: Secondary | ICD-10-CM

## 2018-08-26 NOTE — Therapy (Signed)
[place] MAIN [facility] SERVICES [address] [place], [place], [address] Phone: [phone]   Fax:  [phone]  Physical Therapy Treatment  Patient Details  Name: Kenneth Summers MRN: [id number] Date of Birth: May 04, 1945 Referring Provider (PT): Dr. [name], Guy Begin   Encounter Date: 08/26/2018  PT End of Session - 08/27/18 1047    Visit Number  31    Number of Visits  36    Date for PT Re-Evaluation  09/11/18    Authorization Type  Medicare reporting period from 06/12/2018    Authorization Time Period  Current cert period 5/63/8937 - 09/11/2018    PT Start Time  1302    PT Stop Time  1345    PT Time Calculation (min)  43 min    Equipment Utilized During Treatment  Gait belt    Activity Tolerance  Patient limited by fatigue;Patient limited by pain    Behavior During Therapy  [facility] for tasks assessed/performed       Past Medical History:  Diagnosis Date  . Alcohol abuse   . APS (antiphospholipid syndrome) ([place])   . BPH (benign prostatic hyperplasia)   . CAD (coronary artery disease)   . Cellulitis   . Cervical spinal stenosis   . Chronic osteomyelitis ([place])   . CKD (chronic kidney disease)   . CKD (chronic kidney disease)   . Clostridium difficile diarrhea   . Depression   . Diplopia   . Fatigue   . GERD (gastroesophageal reflux disease)   . Hyperlipemia   . Hypertension   . Hypovitaminosis D   . IBS (irritable bowel syndrome)   . 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 Insomnia   . Left lumbosacral radiculopathy   . Moderate obstructive sleep apnea   . Osteomyelitis of foot ([place])   . PAD (peripheral artery disease) ([place])   . Pruritus   . RA (rheumatoid arthritis) ([place])   . Radiculopathy   . Restless legs syndrome   . RLS (restless legs syndrome)   . SLE (systemic lupus erythematosus related syndrome) ([place])   . Stroke ([place])   . Toxic maculopathy of both eyes     Past Surgical History:  Procedure Laterality Date  . CARDIAC  CATHETERIZATION    . CAROTID ENDARTERECTOMY    . CERVICAL LAMINECTOMY    . CORONARY ANGIOPLASTY    . FRACTURE SURGERY    . fusion C5-6-7    . HERNIA REPAIR    . KNEE ARTHROSCOPY    . TONSILLECTOMY      There were no vitals filed for this visit.  Subjective Assessment - 08/27/18 1046    Subjective  Patient reports doing well; Denies any pain or discomfort; Reports mild fatigue after last session; he reports not doing much over the weekend.    Patient is accompained by:  Family member    Pertinent History  Patient is a 73 y.o. male who presents to outpatient physical therapy with a referral for medical diagnosis of CVA. This patient's chief complaints consist of left hemiplegia and overall deconditioning leading to the following functional deficits: dependent for ADLs, IADLs, unable to transfer to car, dependent transfers at home with hoyer lift, unable to walk or stand without significant assistance, difficulty with W/C navigation..    Limitations  Sitting;Lifting;Standing;House hold activities;Writing;Walking;Other (comment)    Diagnostic tests  Brain MRI 11/01/2017: IMPRESSION: 1. Acute right ACA territory infarct as demonstrated on prior CT imaging. No intracranial hemorrhage or significant mass effect. 2.  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    Patient Stated Goals  wants to be able to walk again    Currently in Pain?  No/denies    Multiple Pain Sites  No              TREATMENT: Patient instructed in sit<>stand transfers in parallel bars: Patient required mod A +2 progressing to Min A +1 for sit<>stand transfer with therapist blocking left knee and cues for forward weight shift and proper hand placement. Instructed patient to keep hands back and elbows up to facilitate increased tricep press and forward weight shift rather than pulling self forward. Patient able to stand for 2 min, 1 min, <1 min x4 reps;  He does fatigue with prolonged  standing; Upon standing, PT instructed patient to increase forward weight shift and to increase hip/knee extension for better standing position. Patient fatigues quickly with increase flexed position requiring cues for extension.  Patient able to stand for 10 sec supervision, 7 sec supervision with 2 parallel bar support with mod VCs for erect posture;  While standing patient able to take steps with RLE with min A for safety and therapist blocking LLE knee:  x2 steps   x3 steps Required cues for erect posture and to improve hip/knee extension for better stance control;    Patient educated in proper positioning and hand placement for better transfer ability; Patient able to transfer better with right hand pushing on wheelchair and LUE on parallel bars and then standing and moving RUE to parallel bars;  Patient does fatigue quickly requiring short seated rest breaks;  Reinforced HEP:  Response to treatment:  Patient able to achieve better transfer ability with improved activation and posture. Patient able to stand for  2+ min with min A +2; He was able to exhibit improved extension with better standing posture and control. Patient was educated in standing posture and positioning progressing to reducing assistance in standing to challenge standing balance. Patient progressed to standing 10 sec with 2 HHA supervision with good extension control.                  PT Education - 08/27/18 1047    Education Details  transfers/positioning; standing balance;    Person(s) Educated  Patient    Methods  Explanation;Verbal cues    Comprehension  Verbalized understanding;Returned demonstration;Verbal cues required;Need further instruction       PT Short Term Goals - 08/22/18 1326      PT SHORT TERM GOAL #1   Title  Be independent with initial home exercise program for self-management of symptoms.    Baseline  HEP to be given at second session; advice to practice unsupported sitting  at home (06/19/2018); 07/08/18: Performing at home, 6/29: needs assistance but is doing exercise program regularly; 08/22/18: adherent;    Time  2    Period  Weeks    Status  Achieved    Target Date  06/26/18        PT Long Term Goals - 08/22/18 1326      PT LONG TERM GOAL #1   Title  Patient will complete all bed mobility with min A to improve functional independence for getting in and out of bed and adjusting in bed.     Baseline  max A - total A reported by family (06/19/2018); 07/08/18: still requires maxA, dtr reports improvement in rolling since starting therapy; 6/23: min A to supervision in clinic; not doing  at home right    Time  12    Period  Weeks    Status  Partially Met    Target Date  09/11/18      PT LONG TERM GOAL #2   Title  Patient will complete sit <> stand transfer chair to chair with Min A and LRAD to improve functional independence for household and community mobility.     Baseline  max A +2 STS at W/C (06/12/2018); max A +2-3 STS or stand pivot transfer, able to complete more reps (06/19/2018); 07/08/18: not safe to attempt at this time    Time  12    Period  Weeks    Status  On-going    Target Date  09/11/18      PT LONG TERM GOAL #3   Title  Patient will navigate manual w/c with min A x 100 feet to improve mobility for household and short community distances.     Baseline  required total A (06/12/2018); able to roll 20 feet with min A (06/20/2018); 07/08/18: Pt can go approximately 10' without assist per family;    Time  12    Period  Weeks    Status  On-going    Target Date  09/11/18      PT LONG TERM GOAL #4   Title  Patient will complete car transfers W/C to family SUV with min A using LRAD to improve his abilty to participate in community activities.     Baseline  unable to complete car transfers at all (06/12/2018); unable to complete car transfers at all (06/19/2018).; 07/08/18: Unable to perform at this time    Time  12    Period  Weeks    Status  On-going    Target  Date  09/11/18      PT LONG TERM GOAL #5   Title  Patient will ambulate at least 150 feet with mod I using LRAD to improve mobility for household and community distances.     Baseline  unable to take steps (06/12/2018); able to shift R leg forward and back with max A and RW at edge of plinth (06/19/2018); 07/08/18: unable to ambulate at this time.     Time  12    Period  Weeks    Status  On-going    Target Date  09/11/18      PT LONG TERM GOAL #6   Title  Patient will navigate 4 steps with min A and BUE support to improve his ability to access community and social participation.     Baseline  unable to complet stairs (06/12/2018); unable to complete stairs (06/19/2018); 07/08/18: unable to attempt at this time    Time  12    Period  Weeks    Status  On-going    Target Date  09/11/18      PT LONG TERM GOAL #7   Title  Patient will increase functional reach test to >15 inches in sitting to exhibit improved sitting balance and positioning and reduce fall risk;    Baseline  07/30/18: 12 inches    Time  4    Period  Weeks    Status  Partially Met    Target Date  09/11/18            Plan - 08/27/18 1054    Clinical Impression Statement  Patient motivated and tolerated session well. He was able to progress transfer ability to min A +1-2; He also was able to  stand for 10 sec with supervision with B rail assist. Patient also able to take a few steps with RLE with therapist blocking LLE knee for safety; patient fatigues quickly with prolonged standing. He also requires cues for better breath control to reduce fatigue. He would benefit from additional skilled PT intervention to improve strength, balance and mobility;    Personal Factors and Comorbidities  Age;Comorbidity 3+    Comorbidities  Relevant past medical history and comorbidities include long term steroid use for lupus, CKD, chronic osteomyelitis, cervical spine stenosis, BPH, APS, alcohol abuse, peripheral artery disease, depression, GERD,  obstructive sleep apnea, HTN, IBS, IgA deficiency, lumbar radiculopathy, RA, Stroke, toxic maculopathy in both eyes, systemic lupus erythematosus related syndrome, cardiac catheterization, carotid endarterectomy, cervical laminectomy, coronary angioplasty, Fusion C5-C7, knee arthroplasty, lumbar surgery.    Examination-Activity Limitations  Bed Mobility;Bend;Sit;Toileting;Stand;Stairs;Lift;Transfers;Squat;Locomotion Level;Carry;Dressing;Hygiene/Grooming;Continence    Examination-Participation Restrictions  Yard Work;Interpersonal Relationship;Community Activity    Stability/Clinical Decision Making  Evolving/Moderate complexity    Rehab Potential  Fair    PT Frequency  3x / week    PT Duration  12 weeks    PT Treatment/Interventions  ADLs/Self Care Home Management;Electrical Stimulation;Moist Heat;Cryotherapy;Gait training;Stair training;Functional mobility training;Therapeutic exercise;Balance training;Neuromuscular re-education;Cognitive remediation;Patient/family education;Orthotic Fit/Training;Wheelchair mobility training;Manual techniques;Passive range of motion;Energy conservation;Joint Manipulations    PT Next Visit Plan  Work on strength, sitting/standing balance, functional activities such as rolling, bed mobility, transfers    PT Home Exercise Plan  No updates today    Consulted and Agree with Plan of Care  Patient;Family member/caregiver    Family Member Consulted  daugher [name]       Patient will benefit from skilled therapeutic intervention in order to improve the following deficits and impairments:  Abnormal gait, Decreased activity tolerance, Decreased cognition, Decreased endurance, Decreased knowledge of use of DME, Decreased range of motion, Decreased skin integrity, Decreased strength, Impaired perceived functional ability, Impaired sensation, Impaired UE functional use, Improper body mechanics, Pain, Cardiopulmonary status limiting activity, Decreased balance, Decreased  coordination, Decreased mobility, Difficulty walking, Impaired tone, Postural dysfunction  Visit Diagnosis: 1. Muscle weakness (generalized)   2. Other lack of coordination   3. Difficulty in walking, not elsewhere classified   4. Repeated falls        Problem List There are no active problems to display for this patient.   Cali Cuartas PT, DPT 08/27/2018, 11:00 AM  [place] MAIN [facility] SERVICES [address] [place], [place], [address] Phone: [phone]   Fax:  [phone]  Name: NILO FALLIN MRN: [id number] Date of Birth: [birth year]

## 2018-08-26 NOTE — Therapy (Signed)
[place] MAIN [facility] SERVICES [address] [place], [place], [address] Phone: [phone]   Fax:  [phone]  Occupational Therapy Treatment  Patient Details  Name: Kenneth Summers MRN: [id number] Date of Birth: 04-16-1945 No data recorded  Encounter Date: 08/26/2018  OT End of Session - 08/26/18 1423    Visit Number  29    Number of Visits  37    Date for OT Re-Evaluation  09/04/18    Authorization Type  Progress report period starting 06/12/2018    OT Start Time  1345    OT Stop Time  1430    OT Time Calculation (min)  45 min    Activity Tolerance  Patient tolerated treatment well    Behavior During Therapy  [facility] for tasks assessed/performed       Past Medical History:  Diagnosis Date  . Alcohol abuse   . APS (antiphospholipid syndrome) ([place])   . BPH (benign prostatic hyperplasia)   . CAD (coronary artery disease)   . Cellulitis   . Cervical spinal stenosis   . Chronic osteomyelitis ([place])   . CKD (chronic kidney disease)   . CKD (chronic kidney disease)   . Clostridium difficile diarrhea   . Depression   . Diplopia   . Fatigue   . GERD (gastroesophageal reflux disease)   . Hyperlipemia   . Hypertension   . Hypovitaminosis D   . IBS (irritable bowel syndrome)   . 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 Insomnia   . Left lumbosacral radiculopathy   . Moderate obstructive sleep apnea   . Osteomyelitis of foot ([place])   . PAD (peripheral artery disease) ([place])   . Pruritus   . RA (rheumatoid arthritis) ([place])   . Radiculopathy   . Restless legs syndrome   . RLS (restless legs syndrome)   . SLE (systemic lupus erythematosus related syndrome) ([place])   . Stroke ([place])   . Toxic maculopathy of both eyes     Past Surgical History:  Procedure Laterality Date  . CARDIAC CATHETERIZATION    . CAROTID ENDARTERECTOMY    . CERVICAL LAMINECTOMY    . CORONARY ANGIOPLASTY    . FRACTURE SURGERY    . fusion C5-6-7    . HERNIA REPAIR    . KNEE  ARTHROSCOPY    . TONSILLECTOMY      There were no vitals filed for this visit.  Subjective Assessment - 08/26/18 1409    Subjective   Pt reports feeling tired after PT session but L shoulder is feeling good.    Patient is accompanied by:  Family member   Daugher Kenneth Summers   Pertinent History  Pt. is a 73 y.o. 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 Pt. PMHx includes: Multiple Falls, Lupus, DJD, Renal Abscess, CVA. Pt. resides with his wife. Pt.'s wife and daughter assist with ADLs. Pt. has caregivers in  for 2 hours a day, 6 days a week. Pt. received Rehab services in acute care, at SNF for STR, and [place] services. Pt. is retired from [employer] for [employer] and [employer].    Patient Stated Goals  To regain use of his left UE, and do more for himself.    Currently in Pain?  No/denies      Neuromuscular re-education:    Patient present with his daughter Kenneth Summers and had just finished with PT and was tired but motivated to continue to work with OT.  He did not have any pain in LUE but did present with edema in L MCPs and dorsal aspect of hand and wrist.  Reviewed weight bearing exercises to do at home using both hands together and for LUE and hand pressed against a chair with daughter helping cue him to keep L elbow extended.  Also discussed edema massage above his heart using a thick lotion like Eucerin cream. He did well with manipulation of metal pieces for grooved pegboard to complete 5 with mod cues and rest breaks.  He reported difficulty seeing the small grooves and position properly.  Improved use of L hand with R for wooden screws with nuts and washers to remove but needed rest break when muscle pain started in his left side of chest and arm.                       OT Education - 08/26/18 1421    Education Details  Left hand functioning    Person(s) Educated  Patient;Child(ren)    Methods  Explanation;Demonstration    Comprehension   Verbalized understanding;Returned demonstration          OT Long Term Goals - 08/05/18 1727      OT LONG TERM GOAL #1   Title  Pt. will increase left shoulder flexion AROM by 10 degrees to access cabinet/shelf.    Baseline  AROM left shoulder flexion: 104    Time  12    Period  Weeks    Status  On-going    Target Date  09/04/18      OT LONG TERM GOAL #2   Title  Pt. will donn a shirt with Supervision.    Baseline  Min-ModA    Time  12    Period  Weeks    Status  On-going    Target Date  09/04/18      OT LONG TERM GOAL #3   Title  Pt. will require ModA to perform LE dressing    Baseline  MaxA left, ModA with the right    Time  12    Period  Weeks    Status  On-going    Target Date  09/04/18      OT LONG TERM GOAL #4   Title  Pt. will improve left grip strength by 10# to be able to open a jar/container.    Baseline  Left: 29, Right 40    Time  12    Status  On-going    Target Date  09/04/18      OT LONG TERM GOAL #5   Title  Pt. will improve left hand [facility] skills to be able to assit with buttoning/zipping.    Baseline  Left: 1 min & 52 sec. R: 56 sec.    Time  12    Period  Weeks    Status  On-going    Target Date  09/04/18      OT LONG TERM GOAL #6   Title  Pt. will demonstrate visual conmpensatory strategies during 100% of the time during self care.    Baseline  Continue    Time  12    Period  Weeks    Status  On-going    Target Date  09/04/18      OT LONG TERM GOAL #7   Title  Pt. will prepare a simple cold snack from w/c with supervision using cognitive compensatory strategies 100% of the time.    Baseline  Pt. is unable    Time  12    Period  Weeks    Status  On-going    Target Date  09/04/18      OT LONG TERM GOAL #8   Title  Pt. will accurately identify potential safety hazard using good safety awareness, and judgement 100% for ADLs, and IADLs.    Baseline  Continue    Time  12    Period  Weeks    Status  On-going    Target Date  09/04/18             Plan - 08/26/18 1428    Clinical Impression Statement  Pt. requiring fewer cues for redirection today and no pain in LUE.  He did have minimal pulling pain in left side of chest which went away after taking a rest break and seemed to be muscular with no other symptoms. Pt. was able to sustain attention when challenged with environmental stimulation with min cues. Pt. continues to work on improvng attention, and Left sided awareness, and LUE functioning during ADLs, and IADL functioning.    OT Occupational Profile and History  Problem Focused Assessment - Including review of records relating to presenting problem    Occupational performance deficits (Please refer to evaluation for details):  ADL's    Body Structure / Function / Physical Skills  UE functional use;Coordination;FMC;Dexterity;Strength;ROM    Cognitive Skills  Attention;Memory;Emotional;Problem Solve;Safety Awareness    Rehab Potential  Good    Clinical Decision Making  Several treatment options, min-mod task modification necessary    Comorbidities Affecting Occupational Performance:  Presence of comorbidities impacting occupational performance    Comorbidities impacting occupational performance description:  Phyical, cognitive, visual,  medical comorbidities    Modification or Assistance to Complete Evaluation   Min-Moderate modification of tasks or assist with assess necessary to complete eval    OT Frequency  3x / week    OT Duration  12 weeks    OT Treatment/Interventions  Self-care/ADL training;DME and/or AE instruction;Therapeutic exercise;Therapeutic activities;Moist Heat;Cognitive remediation/compensation;Neuromuscular education;Visual/perceptual remediation/compensation;Coping strategies training;Patient/family education;Passive range of motion;Psychosocial skills training;Energy conservation;Functional Mobility Training    Recommended Other Services  PT    Consulted and Agree with Plan of Care  Patient;Family  member/caregiver    Family Member Consulted  Daughter Kenneth Summers       Patient will benefit from skilled therapeutic intervention in order to improve the following deficits and impairments:   Body Structure / Function / Physical Skills: UE functional use, Coordination, FMC, Dexterity, Strength, ROM Cognitive Skills: Attention, Memory, Emotional, Problem Solve, Safety Awareness     Visit Diagnosis: 1. Muscle weakness (generalized)   2. Other lack of coordination       Problem List There are no active problems to display for this patient.     Chrys Racer, OTR/L, [facility] ascom [phone] 08/26/18, 2:50 PM  [place] MAIN [facility] SERVICES [address] [place], [place], [address] Phone: [phone]   Fax:  [phone]  Name: KAELIN BONELLI MRN: [id number] Date of Birth: 1945/09/03

## 2018-08-28 ENCOUNTER — Encounter: Payer: Self-pay | Admitting: Occupational Therapy

## 2018-08-28 ENCOUNTER — Ambulatory Visit: Payer: Medicare Other | Admitting: Occupational Therapy

## 2018-08-28 ENCOUNTER — Ambulatory Visit: Payer: Medicare Other | Admitting: Physical Therapy

## 2018-08-28 ENCOUNTER — Encounter: Payer: Self-pay | Admitting: Physical Therapy

## 2018-08-28 ENCOUNTER — Other Ambulatory Visit: Payer: Self-pay

## 2018-08-28 DIAGNOSIS — R278 Other lack of coordination: Secondary | ICD-10-CM

## 2018-08-28 DIAGNOSIS — M6281 Muscle weakness (generalized): Secondary | ICD-10-CM | POA: Diagnosis not present

## 2018-08-28 DIAGNOSIS — R262 Difficulty in walking, not elsewhere classified: Secondary | ICD-10-CM

## 2018-08-28 NOTE — Therapy (Signed)
[place] MAIN [facility] SERVICES [address] [place], [place], [address] Phone: [phone]   Fax:  [phone]  Occupational Therapy Progress Note  Dates of reporting period  06/12/2018  to   08/28/2018  Patient Details  Name: Kenneth Summers MRN: [id number] Date of Birth: [birth year] No data recorded  Encounter Date: 08/28/2018  OT End of Session - 08/28/18 1604    Visit Number  30    Number of Visits  58    Date for OT Re-Evaluation  09/04/18    Authorization Type  Progress report period starting 06/12/2018    OT Start Time  1345    OT Stop Time  1430    OT Time Calculation (min)  45 min    Activity Tolerance  Patient tolerated treatment well    Behavior During Therapy  [facility] for tasks assessed/performed       Past Medical History:  Diagnosis Date  . Alcohol abuse   . APS (antiphospholipid syndrome) ([place])   . BPH (benign prostatic hyperplasia)   . CAD (coronary artery disease)   . Cellulitis   . Cervical spinal stenosis   . Chronic osteomyelitis ([place])   . CKD (chronic kidney disease)   . CKD (chronic kidney disease)   . Clostridium difficile diarrhea   . Depression   . Diplopia   . Fatigue   . GERD (gastroesophageal reflux disease)   . Hyperlipemia   . Hypertension   . Hypovitaminosis D   . IBS (irritable bowel syndrome)   . IgA deficiency ([place])   . Insomnia   . Left lumbosacral radiculopathy   . Moderate obstructive sleep apnea   . Osteomyelitis of foot ([place])   . PAD (peripheral artery disease) ([place])   . Pruritus   . RA (rheumatoid arthritis) ([place])   . Radiculopathy   . Restless legs syndrome   . RLS (restless legs syndrome)   . SLE (systemic lupus erythematosus related syndrome) ([place])   . Stroke ([place])   . Toxic maculopathy of both eyes     Past Surgical History:  Procedure Laterality Date  . CARDIAC CATHETERIZATION    . CAROTID ENDARTERECTOMY    . CERVICAL LAMINECTOMY    . CORONARY ANGIOPLASTY    . FRACTURE  SURGERY    . fusion C5-6-7    . HERNIA REPAIR    . KNEE ARTHROSCOPY    . TONSILLECTOMY      There were no vitals filed for this visit.  Subjective Assessment - 08/28/18 1602    Subjective   Pt. has his new electri wheelchair    Patient is accompanied by:  Family member    Pertinent History  Pt. is a 73 y.o. 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 Pt. PMHx includes: Multiple Falls, Lupus, DJD, Renal Abscess, CVA. Pt. resides with his wife. Pt.'s wife and daughter assist with ADLs. Pt. has caregivers in  for 2 hours a day, 6 days a week. Pt. received Rehab services in acute care, at SNF for STR, and [place] services. Pt. is retired from [employer] for [employer] and [employer].    Patient Stated Goals  To regain use of his left UE, and do more for himself.    Currently in Pain?  No/denies       OT TREATMENT    Neuro muscular re-education:  Pt. worked on grasping, and manipulating 1/2" washers from a magnetic dish using  2pt. grasp  pattern. Pt. worked on reaching up, stabilizing, and sustaining shoulder elevation while placing the washer over a small precise target on vertical dowels positioned at various angles. Pt. fatigued quickly, and the dowel was modified to a lower horizontal position.   Selfcare:  Pt. worked on controlling, and maneuvering his electric w/c navigating through the therapy gym, in and out of the kitchen, and in the hallway. Pt. required extensive cues for left sided awareness, and for scanning his environment for obstacles on the left.                          OT Education - 08/28/18 6295    Education Details  Left hand functioning    Person(s) Educated  Patient;Child(ren)    Methods  Explanation;Demonstration    Comprehension  Verbalized understanding;Returned demonstration          OT Long Term Goals - 08/28/18 1715      OT LONG TERM GOAL #1   Title  Pt. will increase left shoulder flexion  AROM by 10 degrees to access cabinet/shelf.    Baseline  AROM left shoulder flexion: 104    Time  12    Period  Weeks    Status  On-going    Target Date  09/04/18      OT LONG TERM GOAL #2   Title  Pt. will donn a shirt with Supervision.    Baseline  Min-ModA    Time  12    Period  Weeks    Status  On-going    Target Date  09/04/18      OT LONG TERM GOAL #3   Title  Pt. will require ModA to perform LE dressing    Baseline  MaxA left, ModA with the right    Time  12    Period  Weeks    Status  On-going    Target Date  09/04/18      OT LONG TERM GOAL #4   Title  Pt. will improve left grip strength by 10# to be able to open a jar/container.    Baseline  Left: 29, Right 40    Time  12    Period  Weeks    Status  On-going    Target Date  09/04/18      OT LONG TERM GOAL #5   Title  Pt. will improve left hand [facility] skills to be able to assit with buttoning/zipping.    Baseline  Left: 1 min & 52 sec. R: 56 sec.    Time  12    Period  Weeks    Status  On-going      OT LONG TERM GOAL #6   Title  Pt. will demonstrate visual conmpensatory strategies during 100% of the time during self care.    Baseline  Continue    Time  12    Period  Weeks    Status  On-going    Target Date  09/04/18      OT LONG TERM GOAL #7   Title  Pt. will prepare a simple cold snack from w/c with supervision using cognitive compensatory strategies 100% of the time.    Baseline  Pt. is unable    Time  12    Period  Weeks    Status  On-going    Target Date  09/04/18      OT LONG TERM GOAL #8   Title  Pt. will accurately  identify potential safety hazard using good safety awareness, and judgement 100% for ADLs, and IADLs.    Baseline  Continue    Time  12    Period  Weeks    Status  On-going    Target Date  09/04/18            Plan - 08/28/18 1605    Clinical Impression Statement  Pt. required increased cues for redirection, and extensive cues for left sided awareness when attempting to drive  his electric w/c. Pt. presents with increase posterior neck pain, and required repositioning in the seat of the chair. Pt. continues to work on improving LUE strength, and [facility] skills in order to improve overall ADL, and IADL functioning.    OT Occupational Profile and History  Problem Focused Assessment - Including review of records relating to presenting problem    Occupational performance deficits (Please refer to evaluation for details):  ADL's    Body Structure / Function / Physical Skills  UE functional use;Coordination;FMC;Dexterity;Strength;ROM    Cognitive Skills  Attention;Memory;Emotional;Problem Solve;Safety Awareness    Rehab Potential  Good    Clinical Decision Making  Several treatment options, min-mod task modification necessary    Comorbidities Affecting Occupational Performance:  Presence of comorbidities impacting occupational performance    Comorbidities impacting occupational performance description:  Phyical, cognitive, visual,  medical comorbidities    Modification or Assistance to Complete Evaluation   Min-Moderate modification of tasks or assist with assess necessary to complete eval    OT Frequency  3x / week    OT Duration  12 weeks    OT Treatment/Interventions  Self-care/ADL training;DME and/or AE instruction;Therapeutic exercise;Therapeutic activities;Moist Heat;Cognitive remediation/compensation;Neuromuscular education;Visual/perceptual remediation/compensation;Coping strategies training;Patient/family education;Passive range of motion;Psychosocial skills training;Energy conservation;Functional Mobility Training    Consulted and Agree with Plan of Care  Patient;Family member/caregiver    Family Member Consulted  Daughter [name]       Patient will benefit from skilled therapeutic intervention in order to improve the following deficits and impairments:   Body Structure / Function / Physical Skills: UE functional use, Coordination, FMC, Dexterity, Strength, ROM Cognitive  Skills: Attention, Memory, Emotional, Problem Solve, Safety Awareness     Visit Diagnosis: 1. Other lack of coordination   2. Muscle weakness (generalized)       Problem List There are no active problems to display for this patient.   Harrel Carina, MS, OTR/L 08/28/2018, 5:18 PM  [place] MAIN [facility] SERVICES [address] [place], [place], [address] Phone: [phone]   Fax:  [phone]  Name: Kenneth Summers MRN: [id number] Date of Birth: 1945-05-20

## 2018-08-28 NOTE — Therapy (Signed)
[facility] MAIN REHAB SERVICES [address] [place], [place], [address] Phone: [phone]   Fax:  [phone]  Physical Therapy Treatment  Patient Details  Name: Kenneth Summers MRN: [id number] Date of Birth: 05/16/1945 Referring Provider (PT): Dr. Olmedo, Mario Ernesto   Encounter Date: 08/28/2018  PT End of Session - 08/28/18 1512    Visit Number  32    Number of Visits  36    Date for PT Re-Evaluation  09/11/18    Authorization Type  Medicare reporting period from 06/12/2018    Authorization Time Period  Current cert period 06/19/2018 - 09/11/2018    PT Start Time  1302    PT Stop Time  1345    PT Time Calculation (min)  43 min    Equipment Utilized During Treatment  Gait belt    Activity Tolerance  Patient limited by fatigue;Patient limited by pain    Behavior During Therapy  WFL for tasks assessed/performed       Past Medical History:  Diagnosis Date  . Alcohol abuse   . APS (antiphospholipid syndrome) ([facility])   . BPH (benign prostatic hyperplasia)   . CAD (coronary artery disease)   . Cellulitis   . Cervical spinal stenosis   . Chronic osteomyelitis ([facility])   . CKD (chronic kidney disease)   . CKD (chronic kidney disease)   . Clostridium difficile diarrhea   . Depression   . Diplopia   . Fatigue   . GERD (gastroesophageal reflux disease)   . Hyperlipemia   . Hypertension   . Hypovitaminosis D   . IBS (irritable bowel syndrome)   . IgA deficiency ([facility])   . Insomnia   . Left lumbosacral radiculopathy   . Moderate obstructive sleep apnea   . Osteomyelitis of foot ([facility])   . PAD (peripheral artery disease) ([facility])   . Pruritus   . RA (rheumatoid arthritis) ([facility])   . Radiculopathy   . Restless legs syndrome   . RLS (restless legs syndrome)   . SLE (systemic lupus erythematosus related syndrome) ([facility])   . Stroke ([facility])   . Toxic maculopathy of both eyes     Past Surgical History:  Procedure Laterality Date  . CARDIAC  CATHETERIZATION    . CAROTID ENDARTERECTOMY    . CERVICAL LAMINECTOMY    . CORONARY ANGIOPLASTY    . FRACTURE SURGERY    . fusion C5-6-7    . HERNIA REPAIR    . KNEE ARTHROSCOPY    . TONSILLECTOMY      There were no vitals filed for this visit.  Subjective Assessment - 08/28/18 1511    Subjective  Patient reports doing well; Denies any pain or discomfort; Presents to therapy with new power chair. He does report bumping into obstacles having difficulty operating joystick.    Patient is accompained by:  Family member    Pertinent History  Patient is a 73 y.o. male who presents to outpatient physical therapy with a referral for medical diagnosis of CVA. This patient's chief complaints consist of left hemiplegia and overall deconditioning leading to the following functional deficits: dependent for ADLs, IADLs, unable to transfer to car, dependent transfers at home with hoyer lift, unable to walk or stand without significant assistance, difficulty with W/C navigation..    Limitations  Sitting;Lifting;Standing;House hold activities;Writing;Walking;Other (comment)    Diagnostic tests  Brain MRI 11/01/2017: IMPRESSION: 1. Acute right ACA territory infarct as demonstrated on prior CT imaging. No intracranial hemorrhage or significant   mass effect. 2. 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    Patient Stated Goals  wants to be able to walk again    Currently in Pain?  No/denies    Multiple Pain Sites  No         TREATMENT:  Patient presents to therapy with new power chair. He does require min VCs for proper positioning, negotiating doorway and obstacles to get into position in gym.  He was educated on proper positioning of wheelchair prior to transfer training including removing side trunk support to avoid injuring back during transfer; Also tilted chair to forward position with chair adjusted to lowest height position;   Patient instructed in sit<>stand  transfers from power chair: using RW, min A +2 progressing to min A +1 x5 reps; required mod VCs for proper positioning and to increase forward trunk lean for better transfer ability; Patient able to scoot forward in chair well, but often reverts to leaning posteriorly especially during mid transfer requiring assistance for forward weight shift; He denies any increase in knee pain; Therapist was not required to block LLE with patient being able to stand with RLE doing most of the work. Utilized mirror for visual cues for erect posture and weight shift in standing;  Therapist educated patient in proper standing position with instruction to increase forward weight shift over BLE feet for better erect standing position and balance. Able to progress to patient standing 15-20 sec with close supervision using RW for support.  Educated caregiver in sit<>stand transfer with therapist adjusting patient's wheelchair to elevated position to reduce difficulty x2 sets with instruction to daughter for proper positioning and hand placement to avoid injury and instruction to both patient and caregiver in proper sequencing and technique to improve safety and success with sit<>Stand transfer. Patient able to stand with his daughter for a few seconds with heavy posterior lean;  Patient required frequent cues to reach back for chair prior to sitting for better safety awareness. Decreased cognition with impaired memory does affect safety awareness with out of chair safety;   Response to treatment: Tolerated session fair. He does report increased right side low back pain with additional standing which could be related to new chair/positioning or from old injury. No pain at rest. Patient educated in proper positioning to reduce strain on low back; He reports moderate fatigue at end of session. Able to exhibit better sitting and standing posture today compared to previous sessions;                       PT  Education - 08/28/18 1512    Education Details  transfers/positioning, standing balance;    Person(s) Educated  Patient;Child(ren)    Methods  Explanation;Demonstration;Verbal cues    Comprehension  Verbalized understanding;Returned demonstration;Verbal cues required;Need further instruction       PT Short Term Goals - 08/22/18 1326      PT SHORT TERM GOAL #1   Title  Be independent with initial home exercise program for self-management of symptoms.    Baseline  HEP to be given at second session; advice to practice unsupported sitting at home (06/19/2018); 07/08/18: Performing at home, 6/29: needs assistance but is doing exercise program regularly; 08/22/18: adherent;    Time  2    Period  Weeks    Status  Achieved    Target Date  06/26/18        PT Long Term Goals - 08/22/18 1326        PT LONG TERM GOAL #1   Title  Patient will complete all bed mobility with min A to improve functional independence for getting in and out of bed and adjusting in bed.     Baseline  max A - total A reported by family (06/19/2018); 07/08/18: still requires maxA, dtr reports improvement in rolling since starting therapy; 6/23: min A to supervision in clinic; not doing at home right    Time  12    Period  Weeks    Status  Partially Met    Target Date  09/11/18      PT LONG TERM GOAL #2   Title  Patient will complete sit <> stand transfer chair to chair with Min A and LRAD to improve functional independence for household and community mobility.     Baseline  max A +2 STS at W/C (06/12/2018); max A +2-3 STS or stand pivot transfer, able to complete more reps (06/19/2018); 07/08/18: not safe to attempt at this time    Time  12    Period  Weeks    Status  On-going    Target Date  09/11/18      PT LONG TERM GOAL #3   Title  Patient will navigate manual w/c with min A x 100 feet to improve mobility for household and short community distances.     Baseline  required total A (06/12/2018); able to roll 20 feet with min A  (06/20/2018); 07/08/18: Pt can go approximately 10' without assist per family;    Time  12    Period  Weeks    Status  On-going    Target Date  09/11/18      PT LONG TERM GOAL #4   Title  Patient will complete car transfers W/C to family SUV with min A using LRAD to improve his abilty to participate in community activities.     Baseline  unable to complete car transfers at all (06/12/2018); unable to complete car transfers at all (06/19/2018).; 07/08/18: Unable to perform at this time    Time  12    Period  Weeks    Status  On-going    Target Date  09/11/18      PT LONG TERM GOAL #5   Title  Patient will ambulate at least 150 feet with mod I using LRAD to improve mobility for household and community distances.     Baseline  unable to take steps (06/12/2018); able to shift R leg forward and back with max A and RW at edge of plinth (06/19/2018); 07/08/18: unable to ambulate at this time.     Time  12    Period  Weeks    Status  On-going    Target Date  09/11/18      PT LONG TERM GOAL #6   Title  Patient will navigate 4 steps with min A and BUE support to improve his ability to access community and social participation.     Baseline  unable to complet stairs (06/12/2018); unable to complete stairs (06/19/2018); 07/08/18: unable to attempt at this time    Time  12    Period  Weeks    Status  On-going    Target Date  09/11/18      PT LONG TERM GOAL #7   Title  Patient will increase functional reach test to >15 inches in sitting to exhibit improved sitting balance and positioning and reduce fall risk;    Baseline  07/30/18: 12 inches  Time  4    Period  Weeks    Status  Partially Met    Target Date  09/11/18            Plan - 08/28/18 1512    Clinical Impression Statement  Patient motivated and participated well within session. Focused on sit<>stand transfers using RW from power chair. Patient able to progress to min A +1 for sit<>Stand transfer requiring heavy cues and instruction for forward  weight shift and proper positioning. Transfers eased with being able to raise height of surface of chair with power chair function. Patient and caregiver educated on proper positioning and sequencing for safety with transfers. He would benefit from additional skilled PT Intervention to improve strength, balance and mobility;    Personal Factors and Comorbidities  Age;Comorbidity 3+    Comorbidities  Relevant past medical history and comorbidities include long term steroid use for lupus, CKD, chronic osteomyelitis, cervical spine stenosis, BPH, APS, alcohol abuse, peripheral artery disease, depression, GERD, obstructive sleep apnea, HTN, IBS, IgA deficiency, lumbar radiculopathy, RA, Stroke, toxic maculopathy in both eyes, systemic lupus erythematosus related syndrome, cardiac catheterization, carotid endarterectomy, cervical laminectomy, coronary angioplasty, Fusion C5-C7, knee arthroplasty, lumbar surgery.    Examination-Activity Limitations  Bed Mobility;Bend;Sit;Toileting;Stand;Stairs;Lift;Transfers;Squat;Locomotion Level;Carry;Dressing;Hygiene/Grooming;Continence    Examination-Participation Restrictions  Yard Work;Interpersonal Relationship;Community Activity    Stability/Clinical Decision Making  Evolving/Moderate complexity    Rehab Potential  Fair    PT Frequency  3x / week    PT Duration  12 weeks    PT Treatment/Interventions  ADLs/Self Care Home Management;Electrical Stimulation;Moist Heat;Cryotherapy;Gait training;Stair training;Functional mobility training;Therapeutic exercise;Balance training;Neuromuscular re-education;Cognitive remediation;Patient/family education;Orthotic Fit/Training;Wheelchair mobility training;Manual techniques;Passive range of motion;Energy conservation;Joint Manipulations    PT Next Visit Plan  Work on strength, sitting/standing balance, functional activities such as rolling, bed mobility, transfers    PT Home Exercise Plan  No updates today    Consulted and Agree  with Plan of Care  Patient;Family member/caregiver    Family Member Consulted  daugher Janet       Patient will benefit from skilled therapeutic intervention in order to improve the following deficits and impairments:  Abnormal gait, Decreased activity tolerance, Decreased cognition, Decreased endurance, Decreased knowledge of use of DME, Decreased range of motion, Decreased skin integrity, Decreased strength, Impaired perceived functional ability, Impaired sensation, Impaired UE functional use, Improper body mechanics, Pain, Cardiopulmonary status limiting activity, Decreased balance, Decreased coordination, Decreased mobility, Difficulty walking, Impaired tone, Postural dysfunction  Visit Diagnosis: 1. Muscle weakness (generalized)   2. Other lack of coordination   3. Difficulty in walking, not elsewhere classified        Problem List There are no active problems to display for this patient.   Trotter,Margaret PT, DPT 08/28/2018, 3:14 PM  [place] [facility] MAIN REHAB SERVICES [address] [place], [place], [address] Phone: [phone]   Fax:  [phone]  Name: Davidson F Szczerba MRN: [id number] Date of Birth: 08/31/1945   

## 2018-08-29 ENCOUNTER — Encounter: Payer: Self-pay | Admitting: Occupational Therapy

## 2018-08-29 ENCOUNTER — Other Ambulatory Visit: Payer: Self-pay

## 2018-08-29 ENCOUNTER — Ambulatory Visit: Payer: Medicare Other | Admitting: Physical Therapy

## 2018-08-29 ENCOUNTER — Encounter: Payer: Self-pay | Admitting: Physical Therapy

## 2018-08-29 ENCOUNTER — Ambulatory Visit: Payer: Medicare Other | Admitting: Occupational Therapy

## 2018-08-29 DIAGNOSIS — R296 Repeated falls: Secondary | ICD-10-CM

## 2018-08-29 DIAGNOSIS — H547 Unspecified visual loss: Secondary | ICD-10-CM

## 2018-08-29 DIAGNOSIS — R278 Other lack of coordination: Secondary | ICD-10-CM

## 2018-08-29 DIAGNOSIS — M6281 Muscle weakness (generalized): Secondary | ICD-10-CM

## 2018-08-29 DIAGNOSIS — R262 Difficulty in walking, not elsewhere classified: Secondary | ICD-10-CM

## 2018-08-29 NOTE — Therapy (Signed)
[place] MAIN [facility] SERVICES [address] [place], [place], [address] Phone: [phone]   Fax:  [phone]  Physical Therapy Treatment  Patient Details  Name: Kenneth Summers MRN: [id number] Date of Birth: 04-18-1945 Referring Provider (PT): Dr. [name], Guy Begin   Encounter Date: 08/29/2018  PT End of Session - 08/29/18 1503    Visit Number  33    Number of Visits  36    Date for PT Re-Evaluation  09/11/18    Authorization Type  Medicare reporting period from 06/12/2018    Authorization Time Period  Current cert period 0/53/9767 - 09/11/2018    PT Start Time  1302    PT Stop Time  1345    PT Time Calculation (min)  43 min    Equipment Utilized During Treatment  Gait belt    Activity Tolerance  Patient limited by fatigue;Patient limited by pain    Behavior During Therapy  [facility] for tasks assessed/performed       Past Medical History:  Diagnosis Date  . Alcohol abuse   . APS (antiphospholipid syndrome) ([place])   . BPH (benign prostatic hyperplasia)   . CAD (coronary artery disease)   . Cellulitis   . Cervical spinal stenosis   . Chronic osteomyelitis ([place])   . CKD (chronic kidney disease)   . CKD (chronic kidney disease)   . Clostridium difficile diarrhea   . Depression   . Diplopia   . Fatigue   . GERD (gastroesophageal reflux disease)   . Hyperlipemia   . Hypertension   . Hypovitaminosis D   . IBS (irritable bowel syndrome)   . 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 Insomnia   . Left lumbosacral radiculopathy   . Moderate obstructive sleep apnea   . Osteomyelitis of foot ([place])   . PAD (peripheral artery disease) ([place])   . Pruritus   . RA (rheumatoid arthritis) ([place])   . Radiculopathy   . Restless legs syndrome   . RLS (restless legs syndrome)   . SLE (systemic lupus erythematosus related syndrome) ([place])   . Stroke ([place])   . Toxic maculopathy of both eyes     Past Surgical History:  Procedure Laterality Date  . CARDIAC  CATHETERIZATION    . CAROTID ENDARTERECTOMY    . CERVICAL LAMINECTOMY    . CORONARY ANGIOPLASTY    . FRACTURE SURGERY    . fusion C5-6-7    . HERNIA REPAIR    . KNEE ARTHROSCOPY    . TONSILLECTOMY      There were no vitals filed for this visit.  Subjective Assessment - 08/29/18 1502    Subjective  Patient reports doing well; Reports fatigue after last session but denies any pain;    Patient is accompained by:  Family member    Pertinent History  Patient is a 73 y.o. male who presents to outpatient physical therapy with a referral for medical diagnosis of CVA. This patient's chief complaints consist of left hemiplegia and overall deconditioning leading to the following functional deficits: dependent for ADLs, IADLs, unable to transfer to car, dependent transfers at home with hoyer lift, unable to walk or stand without significant assistance, difficulty with W/C navigation..    Limitations  Sitting;Lifting;Standing;House hold activities;Writing;Walking;Other (comment)    Diagnostic tests  Brain MRI 11/01/2017: IMPRESSION: 1. Acute right ACA territory infarct as demonstrated on prior CT imaging. No intracranial hemorrhage or significant mass effect. 2. 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    Patient Stated Goals  wants to be able to walk again    Currently in Pain?  No/denies    Multiple Pain Sites  No            TREATMENT:  Patient presents to therapy with  power chair. He does require min VCs for proper positioning, negotiating doorway and obstacles to get into position in gym.  He was educated on proper positioning of wheelchair prior to transfer training including removing side trunk support to avoid injuring back during transfer; Also tilted chair to forward position with chair adjusted to lowest height position;   Patient instructed in sit<>stand transfers from power chair: using RW, min A +2 progressing to min A +1 x10 reps (with 1 set  of 5 sit<>stands for exercise); required mod VCs for proper positioning and to increase forward trunk lean for better transfer ability; Patient able to scoot forward in chair well, but often reverts to leaning posteriorly especially during mid transfer requiring assistance for forward weight shift; He denies any increase in knee pain; Therapist was not required to block LLE with patient being able to stand with RLE doing most of the work. Utilized Geologist, engineering for visual cues for erect posture and weight shift in standing;  Therapist educated patient in proper standing position with instruction to increase forward weight shift over BLE feet for better erect standing position and balance. Able to progress to patient standing 15-20 sec with close supervision using RW for support x2 sets.  Patient required frequent cues to reach back for chair prior to sitting for better safety awareness. Decreased cognition with impaired memory does affect safety awareness with out of chair safety;   Patient and caregiver educated in ways to exercise in power chair including: -LAQ x5 sets each LE with AAROM on LLE with better muscle activation in sitting compared to previous sessions; -SAQ with chair reclined with therapist providing LLE support to avoid hip ER x5 reps each LE  Response to treatment: Tolerated session fair. Patient fatigued quickly especially after 5 times sit<>stand with increased shortness of breath. He denies any back pain this session. Patient also able to exhibit improved reaching back to chair prior to sitting for improved safety awareness. He does exhibit increased posterior lean this date which is likely related to increased fatigue.                           PT Education - 08/29/18 1502    Education Details  transfers/positioning, standing balance;    Person(s) Educated  Patient;Child(ren)    Methods  Explanation;Demonstration;Verbal cues    Comprehension  Verbalized  understanding;Returned demonstration;Verbal cues required;Need further instruction       PT Short Term Goals - 08/22/18 1326      PT SHORT TERM GOAL #1   Title  Be independent with initial home exercise program for self-management of symptoms.    Baseline  HEP to be given at second session; advice to practice unsupported sitting at home (06/19/2018); 07/08/18: Performing at home, 6/29: needs assistance but is doing exercise program regularly; 08/22/18: adherent;    Time  2    Period  Weeks    Status  Achieved    Target Date  06/26/18        PT Long Term Goals - 08/22/18 1326      PT LONG TERM GOAL #1   Title  Patient will complete all bed mobility with  min A to improve functional independence for getting in and out of bed and adjusting in bed.     Baseline  max A - total A reported by family (06/19/2018); 07/08/18: still requires maxA, dtr reports improvement in rolling since starting therapy; 6/23: min A to supervision in clinic; not doing at home right    Time  12    Period  Weeks    Status  Partially Met    Target Date  09/11/18      PT LONG TERM GOAL #2   Title  Patient will complete sit <> stand transfer chair to chair with Min A and LRAD to improve functional independence for household and community mobility.     Baseline  max A +2 STS at W/C (06/12/2018); max A +2-3 STS or stand pivot transfer, able to complete more reps (06/19/2018); 07/08/18: not safe to attempt at this time    Time  12    Period  Weeks    Status  On-going    Target Date  09/11/18      PT LONG TERM GOAL #3   Title  Patient will navigate manual w/c with min A x 100 feet to improve mobility for household and short community distances.     Baseline  required total A (06/12/2018); able to roll 20 feet with min A (06/20/2018); 07/08/18: Pt can go approximately 10' without assist per family;    Time  12    Period  Weeks    Status  On-going    Target Date  09/11/18      PT LONG TERM GOAL #4   Title  Patient will complete  car transfers W/C to family SUV with min A using LRAD to improve his abilty to participate in community activities.     Baseline  unable to complete car transfers at all (06/12/2018); unable to complete car transfers at all (06/19/2018).; 07/08/18: Unable to perform at this time    Time  12    Period  Weeks    Status  On-going    Target Date  09/11/18      PT LONG TERM GOAL #5   Title  Patient will ambulate at least 150 feet with mod I using LRAD to improve mobility for household and community distances.     Baseline  unable to take steps (06/12/2018); able to shift R leg forward and back with max A and RW at edge of plinth (06/19/2018); 07/08/18: unable to ambulate at this time.     Time  12    Period  Weeks    Status  On-going    Target Date  09/11/18      PT LONG TERM GOAL #6   Title  Patient will navigate 4 steps with min A and BUE support to improve his ability to access community and social participation.     Baseline  unable to complet stairs (06/12/2018); unable to complete stairs (06/19/2018); 07/08/18: unable to attempt at this time    Time  12    Period  Weeks    Status  On-going    Target Date  09/11/18      PT LONG TERM GOAL #7   Title  Patient will increase functional reach test to >15 inches in sitting to exhibit improved sitting balance and positioning and reduce fall risk;    Baseline  07/30/18: 12 inches    Time  4    Period  Weeks    Status  Partially Met    Target Date  09/11/18            Plan - 08/29/18 1503    Clinical Impression Statement  Patient motivated and participated well within session; He was able to exhibit better muscle activation with better exercise technique. Educated patient and caregiver in ways to exercise in power chair for better muscle activation with less strain. Patient able to progress transfers although continues to require min A +1-2 with increased posterior lean. He does fatigue with increased repetition. patient reports less pain today; he  would benefit from additional skilled PT intervention to improve strength, balance and gait safety;    Personal Factors and Comorbidities  Age;Comorbidity 3+    Comorbidities  Relevant past medical history and comorbidities include long term steroid use for lupus, CKD, chronic osteomyelitis, cervical spine stenosis, BPH, APS, alcohol abuse, peripheral artery disease, depression, GERD, obstructive sleep apnea, HTN, IBS, IgA deficiency, lumbar radiculopathy, RA, Stroke, toxic maculopathy in both eyes, systemic lupus erythematosus related syndrome, cardiac catheterization, carotid endarterectomy, cervical laminectomy, coronary angioplasty, Fusion C5-C7, knee arthroplasty, lumbar surgery.    Examination-Activity Limitations  Bed Mobility;Bend;Sit;Toileting;Stand;Stairs;Lift;Transfers;Squat;Locomotion Level;Carry;Dressing;Hygiene/Grooming;Continence    Examination-Participation Restrictions  Yard Work;Interpersonal Relationship;Community Activity    Stability/Clinical Decision Making  Evolving/Moderate complexity    Rehab Potential  Fair    PT Frequency  3x / week    PT Duration  12 weeks    PT Treatment/Interventions  ADLs/Self Care Home Management;Electrical Stimulation;Moist Heat;Cryotherapy;Gait training;Stair training;Functional mobility training;Therapeutic exercise;Balance training;Neuromuscular re-education;Cognitive remediation;Patient/family education;Orthotic Fit/Training;Wheelchair mobility training;Manual techniques;Passive range of motion;Energy conservation;Joint Manipulations    PT Next Visit Plan  Work on strength, sitting/standing balance, functional activities such as rolling, bed mobility, transfers    PT Home Exercise Plan  No updates today    Consulted and Agree with Plan of Care  Patient;Family member/caregiver    Family Member Consulted  daugher [name]       Patient will benefit from skilled therapeutic intervention in order to improve the following deficits and impairments:   Abnormal gait, Decreased activity tolerance, Decreased cognition, Decreased endurance, Decreased knowledge of use of DME, Decreased range of motion, Decreased skin integrity, Decreased strength, Impaired perceived functional ability, Impaired sensation, Impaired UE functional use, Improper body mechanics, Pain, Cardiopulmonary status limiting activity, Decreased balance, Decreased coordination, Decreased mobility, Difficulty walking, Impaired tone, Postural dysfunction  Visit Diagnosis: 1. Other lack of coordination   2. Muscle weakness (generalized)   3. Difficulty in walking, not elsewhere classified   4. Repeated falls        Problem List There are no active problems to display for this patient.   Delmore Sear PT, DPT 08/29/2018, 3:06 PM  [place] MAIN [facility] SERVICES [address] [place], [place], [address] Phone: [phone]   Fax:  [phone]  Name: IZAAN KINGBIRD MRN: [id number] Date of Birth: 10/18/1945

## 2018-08-29 NOTE — Therapy (Addendum)
[place] MAIN [facility] SERVICES [address] [place], [place], [address] Phone: [phone]   Fax:  [phone]  Occupational Therapy Treatment  Patient Details  Name: Kenneth Summers MRN: [id number] Date of Birth: 06/11/1945 No data recorded  Encounter Date: 08/29/2018  OT End of Session - 08/29/18 1626    Visit Number  31    Number of Visits  30    Date for OT Re-Evaluation  09/04/18    OT Start Time  1345    OT Stop Time  1430    OT Time Calculation (min)  45 min    Activity Tolerance  Patient tolerated treatment well    Behavior During Therapy  Kenneth Summers for tasks assessed/performed       Past Medical History:  Diagnosis Date  . Alcohol abuse   . APS (antiphospholipid syndrome) ([place])   . BPH (benign prostatic hyperplasia)   . CAD (coronary artery disease)   . Cellulitis   . Cervical spinal stenosis   . Chronic osteomyelitis ([place])   . CKD (chronic kidney disease)   . CKD (chronic kidney disease)   . Clostridium difficile diarrhea   . Depression   . Diplopia   . Fatigue   . GERD (gastroesophageal reflux disease)   . Hyperlipemia   . Hypertension   . Hypovitaminosis D   . IBS (irritable bowel syndrome)   . 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 Insomnia   . Left lumbosacral radiculopathy   . Moderate obstructive sleep apnea   . Osteomyelitis of foot ([place])   . PAD (peripheral artery disease) ([place])   . Pruritus   . RA (rheumatoid arthritis) ([place])   . Radiculopathy   . Restless legs syndrome   . RLS (restless legs syndrome)   . SLE (systemic lupus erythematosus related syndrome) ([place])   . Stroke ([place])   . Toxic maculopathy of both eyes     Past Surgical History:  Procedure Laterality Date  . CARDIAC CATHETERIZATION    . CAROTID ENDARTERECTOMY    . CERVICAL LAMINECTOMY    . CORONARY ANGIOPLASTY    . FRACTURE SURGERY    . fusion C5-6-7    . HERNIA REPAIR    . KNEE ARTHROSCOPY    . TONSILLECTOMY      There were no vitals filed  for this visit.  Subjective Assessment - 08/29/18 1625    Subjective   Pt. has his new electri wheelchair    Patient is accompanied by:  Family member    Pertinent History  Pt. is a 73 y.o. 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 Pt. PMHx includes: Multiple Falls, Lupus, DJD, Renal Abscess, CVA. Pt. resides with his wife. Pt.'s wife and daughter assist with ADLs. Pt. has caregivers in  for 2 hours a day, 6 days a week. Pt. received Rehab services in acute care, at SNF for STR, and [place] services. Pt. is retired from [employer] for [employer] and [employer].    Currently in Pain?  No/denies      OT TREATMENT    Neuro muscular re-education:  Pt. worked on  grasping, and flipping 2" large pegs with his left hand on the Instructo board placed at a tabletop surface. Pt. Required few cues to flip all of the pegs on the left. The board was repositioned further to the left to challenge visual scanning, and increase complexity of the task.  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 Pt. had difficulty identifying cards to the far upper and lower left.                        OT Education - 08/29/18 1625    Education Details  Left hand functioning    Methods  Explanation;Demonstration    Comprehension  Verbalized understanding;Returned demonstration          OT Long Term Goals - 08/28/18 1715      OT LONG TERM GOAL #1   Title  Pt. will increase left shoulder flexion AROM by 10 degrees to access cabinet/shelf.    Baseline  AROM left shoulder flexion: 104    Time  12    Period  Weeks    Status  On-going    Target Date  09/04/18      OT LONG TERM GOAL #2   Title  Pt. will donn a shirt with Supervision.    Baseline  Min-ModA    Time  12    Period  Weeks    Status  On-going    Target Date  09/04/18      OT LONG TERM GOAL #3   Title  Pt.  will require ModA to perform LE dressing    Baseline  MaxA left, ModA with the right    Time  12    Period  Weeks    Status  On-going    Target Date  09/04/18      OT LONG TERM GOAL #4   Title  Pt. will improve left grip strength by 10# to be able to open a jar/container.    Baseline  Left: 29, Right 40    Time  12    Period  Weeks    Status  On-going    Target Date  09/04/18      OT LONG TERM GOAL #5   Title  Pt. will improve left hand [facility] skills to be able to assit with buttoning/zipping.    Baseline  Left: 1 min & 52 sec. R: 56 sec.    Time  12    Period  Weeks    Status  On-going      OT LONG TERM GOAL #6   Title  Pt. will demonstrate visual conmpensatory strategies during 100% of the time during self care.    Baseline  Continue    Time  12    Period  Weeks    Status  On-going    Target Date  09/04/18      OT LONG TERM GOAL #7   Title  Pt. will prepare a simple cold snack from w/c with supervision using cognitive compensatory strategies 100% of the time.    Baseline  Pt. is unable    Time  12    Period  Weeks    Status  On-going    Target Date  09/04/18      OT LONG TERM GOAL #8   Title  Pt. will accurately identify potential safety hazard using good safety awareness, and judgement 100% for ADLs, and IADLs.    Baseline  Continue    Time  12    Period  Weeks    Status  On-going    Target Date  09/04/18            Plan - 08/29/18 1626    Clinical  Impression Statement  Pt. required fewer cues for left sided awareness, and scanning his environment ton the left when maneuvering through his environment in the w/c. Pt. requires cues for redirection, however was not as distractible today, and was able to [place] his attention more during the session today. Pt. conitnues to work on improving attention, left sided awareness, visual scanning, visual and cognitive compensatory strategies, and LUE functioning to maximize independence with ADLs, and IADLs.    OT  Occupational Profile and History  Problem Focused Assessment - Including review of records relating to presenting problem    Occupational performance deficits (Please refer to evaluation for details):  ADL's    Body Structure / Function / Physical Skills  UE functional use;Coordination;FMC;Dexterity;Strength;ROM    Cognitive Skills  Attention;Memory;Emotional;Problem Solve;Safety Awareness    Rehab Potential  Good    Clinical Decision Making  Several treatment options, min-mod task modification necessary    Comorbidities Affecting Occupational Performance:  Presence of comorbidities impacting occupational performance    Modification or Assistance to Complete Evaluation   Min-Moderate modification of tasks or assist with assess necessary to complete eval    OT Frequency  3x / week    OT Duration  12 weeks    OT Treatment/Interventions  Self-care/ADL training;DME and/or AE instruction;Therapeutic exercise;Therapeutic activities;Moist Heat;Cognitive remediation/compensation;Neuromuscular education;Visual/perceptual remediation/compensation;Coping strategies training;Patient/family education;Passive range of motion;Psychosocial skills training;Energy conservation;Functional Mobility Training    Recommended Other Services  PT    Consulted and Agree with Plan of Care  Patient;Family member/caregiver    Family Member Consulted  Daughter [name]       Patient will benefit from skilled therapeutic intervention in order to improve the following deficits and impairments:   Body Structure / Function / Physical Skills: UE functional use, Coordination, FMC, Dexterity, Strength, ROM Cognitive Skills: Attention, Memory, Emotional, Problem Solve, Safety Awareness     Visit Diagnosis: 1. Muscle weakness (generalized)   2. Other lack of coordination   3. Vision impairment       Problem List There are no active problems to display for this patient.   Harrel Carina, MS, OTR/L 08/29/2018, 4:32  PM  [place] MAIN [facility] SERVICES [address] [place], [place], [address] Phone: [phone]   Fax:  [phone]  Name: Kenneth Summers MRN: [id number] Date of Birth: Sep 11, 1945

## 2018-09-02 ENCOUNTER — Encounter: Payer: Medicare Other | Admitting: Occupational Therapy

## 2018-09-02 ENCOUNTER — Ambulatory Visit: Payer: Medicare Other | Admitting: Physical Therapy

## 2018-09-04 ENCOUNTER — Encounter: Payer: Self-pay | Admitting: Physical Therapy

## 2018-09-04 ENCOUNTER — Other Ambulatory Visit: Payer: Self-pay

## 2018-09-04 ENCOUNTER — Encounter: Payer: Self-pay | Admitting: Occupational Therapy

## 2018-09-04 ENCOUNTER — Ambulatory Visit: Payer: Medicare Other | Admitting: Occupational Therapy

## 2018-09-04 ENCOUNTER — Ambulatory Visit: Payer: Medicare Other | Admitting: Physical Therapy

## 2018-09-04 DIAGNOSIS — R296 Repeated falls: Secondary | ICD-10-CM

## 2018-09-04 DIAGNOSIS — R278 Other lack of coordination: Secondary | ICD-10-CM

## 2018-09-04 DIAGNOSIS — M6281 Muscle weakness (generalized): Secondary | ICD-10-CM | POA: Diagnosis not present

## 2018-09-04 DIAGNOSIS — R262 Difficulty in walking, not elsewhere classified: Secondary | ICD-10-CM

## 2018-09-04 NOTE — Therapy (Signed)
[name] [facility] SERVICES [address] [place], [place], [address] Phone: [phone]   Fax:  [phone]  Physical Therapy Treatment  Patient Details  Name: Kenneth Summers MRN: [id number] Date of Birth: 04/05/1945 Referring Provider (PT): Dr. [name], Guy Begin   Encounter Date: 09/04/2018  PT End of Session - 09/05/18 0805    Visit Number  34    Number of Visits  36    Date for PT Re-Evaluation  09/11/18    Authorization Type  Medicare reporting period from 06/12/2018    Authorization Time Period  Current cert period 2/87/8676 - 09/11/2018    PT Start Time  1301    PT Stop Time  1345    PT Time Calculation (min)  44 min    Equipment Utilized During Treatment  Gait belt    Activity Tolerance  Patient limited by fatigue;Patient limited by pain    Behavior During Therapy  [facility] for tasks assessed/performed       Past Medical History:  Diagnosis Date  . Alcohol abuse   . APS (antiphospholipid syndrome) ([place])   . BPH (benign prostatic hyperplasia)   . CAD (coronary artery disease)   . Cellulitis   . Cervical spinal stenosis   . Chronic osteomyelitis ([place])   . CKD (chronic kidney disease)   . CKD (chronic kidney disease)   . Clostridium difficile diarrhea   . Depression   . Diplopia   . Fatigue   . GERD (gastroesophageal reflux disease)   . Hyperlipemia   . Hypertension   . Hypovitaminosis D   . IBS (irritable bowel syndrome)   . 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 Insomnia   . Left lumbosacral radiculopathy   . Moderate obstructive sleep apnea   . Osteomyelitis of foot ([place])   . PAD (peripheral artery disease) ([place])   . Pruritus   . RA (rheumatoid arthritis) ([place])   . Radiculopathy   . Restless legs syndrome   . RLS (restless legs syndrome)   . SLE (systemic lupus erythematosus related syndrome) ([place])   . Stroke ([place])   . Toxic maculopathy of both eyes     Past Surgical History:  Procedure Laterality Date  . CARDIAC  CATHETERIZATION    . CAROTID ENDARTERECTOMY    . CERVICAL LAMINECTOMY    . CORONARY ANGIOPLASTY    . FRACTURE SURGERY    . fusion C5-6-7    . HERNIA REPAIR    . KNEE ARTHROSCOPY    . TONSILLECTOMY      There were no vitals filed for this visit.  Subjective Assessment - 09/05/18 0804    Subjective  Patient reports being frustrated with having difficulty operating power chair. He presents to therapy with multiple bruises along left arm. Denies any soreness today; reports mixed adherence with HEP    Patient is accompained by:  Family member    Pertinent History  Patient is a 73 y.o. male who presents to outpatient physical therapy with a referral for medical diagnosis of CVA. This patient's chief complaints consist of left hemiplegia and overall deconditioning leading to the following functional deficits: dependent for ADLs, IADLs, unable to transfer to car, dependent transfers at home with hoyer lift, unable to walk or stand without significant assistance, difficulty with W/C navigation..    Limitations  Sitting;Lifting;Standing;House hold activities;Writing;Walking;Other (comment)    Diagnostic tests  Brain MRI 11/01/2017: IMPRESSION: 1. Acute right ACA territory infarct as demonstrated on prior CT imaging. No intracranial  hemorrhage or significant mass effect. 2. 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    Patient Stated Goals  wants to be able to walk again    Currently in Pain?  No/denies    Multiple Pain Sites  No          TREATMENT:  Patient presents to therapy with  power chair. He does require min VCs for proper positioning, negotiating doorway and obstacles to get into position in gym.  He was educated on proper positioning of wheelchair prior to transfer training including removing side trunk support to avoid injuring back during transfer; Also tilted chair to forward position with chair adjusted to mid height position;   Patient  instructed in sit<>stand transfers from power chair: using RW, mod A +2 x4 reps; required mod VCs for proper positioning and to increase forward trunk lean for better transfer ability; Patient able to scoot forward in chair well, but often reverts to leaning posteriorly especially during mid transfer requiring assistance for forward weight shift; He denies any increase in knee pain; Therapist was not required to block LLE with patient being able to stand with RLE doing most of the work. Utilized Geologist, engineering for visual cues for erect posture and weight shift in standing;  Therapist educated patient in proper standing position with instruction to increase forward weight shift over BLE feet for better erect standing position and balance. Patient stood with min A +2 for 2.5 min, 1 min, 30 sec x2 sets respectively;   Patient required frequent cues to reach back for chair prior to sitting for better safety awareness. Decreased cognition with impaired memory does affect safety awareness with out of chair safety; He was only able to reach back to arm rest 1 repetition out of the 4 transfers;   Instructed patient in squad pivot transfer wheelchair<>Nustep with max A +2 with patient requiring max VCs for hand placement and positioning; Patient able to come up to partial stand, but has significant difficulty with pivot requiring increased assistance; Transferred nustep to wheelchair with max A +3 requiring 3rd person assist to pivot to wheelchair;  Patient required mod A +2 for scooting back in chair and obtaining optimal position in chair;  Exercise: Patient and caregiver educated in ways to exercise in power chair including: -LAQ x5 sets each LE with AAROM on LLE with better muscle activation in sitting compared to previous sessions; Instructed patient in LLE leg kicks for quad activation x5 reps with mod verbal and visual cues to improve force and muscle activation; Seated hip flexion march x5 reps with AAROM  on LLE to facilitate increased ROM for better strengthening;   Nustep: BUE/BLE level 2 x5 min with leg attachement on LLE to avoid hip ER for better knee control; Finished with patient propelling Nustep with BLE only x10 steps on level 2 for better resistance;   Response to treatment: Tolerated session fair. Patient fatigued quickly within session. He appears more depressed requiring increased cues for motivation and encouragement with progress that has been made to date. Patient had increased difficulty with transfers today which likely is due to length of time between sessions and decreased mobility. He is not safe to work on transfers at home and has been using hoyer lift for mobility and therefore had difficulty with motor planning during session. Patient would benefit from additional education/instruction in stand pivot transfer as he seems to have forgotten the pivot portion of the transfer with increased repetition in sit<>Stand transfers;  PT Education - 09/05/18 0805    Education Details  transfers/positioning, standing balance, strengthening;    Person(s) Educated  Patient;Child(ren)    Methods  Explanation;Demonstration;Verbal cues    Comprehension  Verbalized understanding;Returned demonstration;Verbal cues required;Need further instruction       PT Short Term Goals - 08/22/18 1326      PT SHORT TERM GOAL #1   Title  Be independent with initial home exercise program for self-management of symptoms.    Baseline  HEP to be given at second session; advice to practice unsupported sitting at home (06/19/2018); 07/08/18: Performing at home, 6/29: needs assistance but is doing exercise program regularly; 08/22/18: adherent;    Time  2    Period  Weeks    Status  Achieved    Target Date  06/26/18        PT Long Term Goals - 08/22/18 1326      PT LONG TERM GOAL #1   Title  Patient will complete all bed mobility with min A to improve functional independence  for getting in and out of bed and adjusting in bed.     Baseline  max A - total A reported by family (06/19/2018); 07/08/18: still requires maxA, dtr reports improvement in rolling since starting therapy; 6/23: min A to supervision in clinic; not doing at home right    Time  12    Period  Weeks    Status  Partially Met    Target Date  09/11/18      PT LONG TERM GOAL #2   Title  Patient will complete sit <> stand transfer chair to chair with Min A and LRAD to improve functional independence for household and community mobility.     Baseline  max A +2 STS at W/C (06/12/2018); max A +2-3 STS or stand pivot transfer, able to complete more reps (06/19/2018); 07/08/18: not safe to attempt at this time    Time  12    Period  Weeks    Status  On-going    Target Date  09/11/18      PT LONG TERM GOAL #3   Title  Patient will navigate manual w/c with min A x 100 feet to improve mobility for household and short community distances.     Baseline  required total A (06/12/2018); able to roll 20 feet with min A (06/20/2018); 07/08/18: Pt can go approximately 10' without assist per family;    Time  12    Period  Weeks    Status  On-going    Target Date  09/11/18      PT LONG TERM GOAL #4   Title  Patient will complete car transfers W/C to family SUV with min A using LRAD to improve his abilty to participate in community activities.     Baseline  unable to complete car transfers at all (06/12/2018); unable to complete car transfers at all (06/19/2018).; 07/08/18: Unable to perform at this time    Time  12    Period  Weeks    Status  On-going    Target Date  09/11/18      PT LONG TERM GOAL #5   Title  Patient will ambulate at least 150 feet with mod I using LRAD to improve mobility for household and community distances.     Baseline  unable to take steps (06/12/2018); able to shift R leg forward and back with max A and RW at edge of plinth (06/19/2018); 07/08/18: unable to  ambulate at this time.     Time  12    Period   Weeks    Status  On-going    Target Date  09/11/18      PT LONG TERM GOAL #6   Title  Patient will navigate 4 steps with min A and BUE support to improve his ability to access community and social participation.     Baseline  unable to complet stairs (06/12/2018); unable to complete stairs (06/19/2018); 07/08/18: unable to attempt at this time    Time  12    Period  Weeks    Status  On-going    Target Date  09/11/18      PT LONG TERM GOAL #7   Title  Patient will increase functional reach test to >15 inches in sitting to exhibit improved sitting balance and positioning and reduce fall risk;    Baseline  07/30/18: 12 inches    Time  4    Period  Weeks    Status  Partially Met    Target Date  09/11/18            Plan - 09/05/18 0806    Clinical Impression Statement  Patient participated well within session. He does appear more depressed today likely from frustration over difficulty using power chair and overall functional limitations. PT will reach out to power chair representative regarding possible modifications that can be made to improve patient safety with power chair mobility. Patient instructed in sit<>Stand transfers. he had increased difficulty with forward weight shift requiring increased verbal and tactile cues. He fatigues quickly with standing, likely due to immobility over last week. Patient re-educated in HEP for importance of LE movement. Patient instructed in Nustep activity to facilitate better LE movement, he had significant diffciulty completing a squat pivot transfer due to fear of falling. He would benefit from additional skilled PT Intervention to improve strength, balance and mobility;    Personal Factors and Comorbidities  Age;Comorbidity 3+    Comorbidities  Relevant past medical history and comorbidities include long term steroid use for lupus, CKD, chronic osteomyelitis, cervical spine stenosis, BPH, APS, alcohol abuse, peripheral artery disease, depression, GERD,  obstructive sleep apnea, HTN, IBS, IgA deficiency, lumbar radiculopathy, RA, Stroke, toxic maculopathy in both eyes, systemic lupus erythematosus related syndrome, cardiac catheterization, carotid endarterectomy, cervical laminectomy, coronary angioplasty, Fusion C5-C7, knee arthroplasty, lumbar surgery.    Examination-Activity Limitations  Bed Mobility;Bend;Sit;Toileting;Stand;Stairs;Lift;Transfers;Squat;Locomotion Level;Carry;Dressing;Hygiene/Grooming;Continence    Examination-Participation Restrictions  Yard Work;Interpersonal Relationship;Community Activity    Stability/Clinical Decision Making  Evolving/Moderate complexity    Rehab Potential  Fair    PT Frequency  3x / week    PT Duration  12 weeks    PT Treatment/Interventions  ADLs/Self Care Home Management;Electrical Stimulation;Moist Heat;Cryotherapy;Gait training;Stair training;Functional mobility training;Therapeutic exercise;Balance training;Neuromuscular re-education;Cognitive remediation;Patient/family education;Orthotic Fit/Training;Wheelchair mobility training;Manual techniques;Passive range of motion;Energy conservation;Joint Manipulations    PT Next Visit Plan  Work on strength, sitting/standing balance, functional activities such as rolling, bed mobility, transfers    PT Home Exercise Plan  No updates today    Consulted and Agree with Plan of Care  Patient;Family member/caregiver    Family Member Consulted  daugher [name]       Patient will benefit from skilled therapeutic intervention in order to improve the following deficits and impairments:  Abnormal gait, Decreased activity tolerance, Decreased cognition, Decreased endurance, Decreased knowledge of use of DME, Decreased range of motion, Decreased skin integrity, Decreased strength, Impaired perceived functional ability, Impaired sensation, Impaired UE  functional use, Improper body mechanics, Pain, Cardiopulmonary status limiting activity, Decreased balance, Decreased  coordination, Decreased mobility, Difficulty walking, Impaired tone, Postural dysfunction  Visit Diagnosis: 1. Muscle weakness (generalized)   2. Other lack of coordination   3. Difficulty in walking, not elsewhere classified   4. Repeated falls        Problem List There are no active problems to display for this patient.   Trotter,MargaretPT, DPT 09/05/2018, 9:14 AM  [place] MAIN [facility] SERVICES [address] [place], [place], [address] Phone: [phone]   Fax:  [phone]  Name: CHRISHON MARTINO MRN: [id number] Date of Birth: 29-Apr-1945

## 2018-09-04 NOTE — Therapy (Signed)
[place] MAIN [facility] SERVICES [address] [place], [place], [address] Phone: [phone]   Fax:  [phone]  Occupational Therapy Treatment  Patient Details  Name: Kenneth Summers MRN: [id number] Date of Birth: [birth year] No data recorded  Encounter Date: 09/04/2018  OT End of Session - 09/04/18 1441    Visit Number  32    Number of Visits  48    Date for OT Re-Evaluation  11/27/18    OT Start Time  8242    OT Stop Time  1430    OT Time Calculation (min)  35 min    Activity Tolerance  Patient tolerated treatment well    Behavior During Therapy  [facility] for tasks assessed/performed       Past Medical History:  Diagnosis Date  . Alcohol abuse   . APS (antiphospholipid syndrome) ([place])   . BPH (benign prostatic hyperplasia)   . CAD (coronary artery disease)   . Cellulitis   . Cervical spinal stenosis   . Chronic osteomyelitis ([place])   . CKD (chronic kidney disease)   . CKD (chronic kidney disease)   . Clostridium difficile diarrhea   . Depression   . Diplopia   . Fatigue   . GERD (gastroesophageal reflux disease)   . Hyperlipemia   . Hypertension   . Hypovitaminosis D   . IBS (irritable bowel syndrome)   . IgA deficiency ([place])   . Insomnia   . Left lumbosacral radiculopathy   . Moderate obstructive sleep apnea   . Osteomyelitis of foot ([place])   . PAD (peripheral artery disease) ([place])   . Pruritus   . RA (rheumatoid arthritis) ([place])   . Radiculopathy   . Restless legs syndrome   . RLS (restless legs syndrome)   . SLE (systemic lupus erythematosus related syndrome) ([place])   . Stroke ([place])   . Toxic maculopathy of both eyes     Past Surgical History:  Procedure Laterality Date  . CARDIAC CATHETERIZATION    . CAROTID ENDARTERECTOMY    . CERVICAL LAMINECTOMY    . CORONARY ANGIOPLASTY    . FRACTURE SURGERY    . fusion C5-6-7    . HERNIA REPAIR    . KNEE ARTHROSCOPY    . TONSILLECTOMY      There were no vitals filed  for this visit.  Subjective Assessment - 09/04/18 1440    Subjective   Pt. has his new electric wheelchair    Patient is accompanied by:  Family member    Pertinent History  Pt. is a 73 y.o. 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 Pt. PMHx includes: Multiple Falls, Lupus, DJD, Renal Abscess, CVA. Pt. resides with his wife. Pt.'s wife and daughter assist with ADLs. Pt. has caregivers in  for 2 hours a day, 6 days a week. Pt. received Rehab services in acute care, at SNF for STR, and [place] services. Pt. is retired from [employer] for [employer] and [employer].         [facility] OT Assessment - 09/04/18 1405      Coordination   Right 9 Hole Peg Test  57    Left 9 Hole Peg Test  1 min. & 50 sec.      AROM   Overall AROM Comments  Left shoulder flexion 108, abduction 77, elbow 12-130, wrist flwxion 42, wrist extension 40      Hand Function   Right Hand Grip (lbs)  40    Right Hand Lateral Pinch  10 lbs    Right Hand 3 Point Pinch  7 lbs    Left Hand Grip (lbs)  30   3rd slot on dynamometer   Left Hand Lateral Pinch  6 lbs    Left 3 point pinch  7 lbs      OT TREATMENT    Measurements were obtained, and goals were reviewed with the pt.                        OT Long Term Goals - 09/04/18 1425      OT LONG TERM GOAL #1   Title  Pt. will increase left shoulder flexion AROM by 10 degrees to access cabinet/shelf.    Baseline  AROM left shoulder flexion: 108    Time  12    Period  Weeks    Status  On-going    Target Date  11/27/18      OT LONG TERM GOAL #2   Title  Pt. will donn a shirt with Supervision.    Baseline  Min-ModA    Time  12    Period  Weeks    Status  On-going    Target Date  11/27/18      OT LONG TERM GOAL #3   Title  Pt. will require ModA to perform LE dressing    Baseline  MaxA left, ModA with the right    Time  12    Period  Weeks    Status  On-going    Target Date  11/27/18      OT LONG  TERM GOAL #4   Title  Pt. will improve left grip strength by 10# to be able to open a jar/container.    Baseline  Left: 30, Right 40    Time  12    Period  Weeks    Status  On-going    Target Date  11/27/18      OT LONG TERM GOAL #5   Title  Pt. will improve left hand [facility] skills to be able to assit with buttoning/zipping.    Baseline  Left: 1 min & 50 sec. R: 56 sec.    Time  12    Period  Weeks    Status  New    Target Date  11/27/18      OT LONG TERM GOAL #6   Title  Pt. will demonstrate visual conmpensatory strategies during 100% of the time during ADLs    Baseline  Pt. requires min-mod for left side awareness for tabletop tasks in his near space, Max cues in the extrapersonal space    Time  12    Period  Weeks    Status  On-going    Target Date  11/27/18      OT LONG TERM GOAL #7   Title  Pt. will prepare a simple cold snack from w/c with supervision using cognitive compensatory strategies 100% of the time.    Baseline  Pt. is unable    Time  12    Period  Weeks    Status  On-going    Target Date  11/27/18      OT LONG TERM GOAL #8   Title  Pt. will accurately identify potential safety hazard using good safety awareness, and judgement 100% for ADLs, and IADLs.    Baseline  limited    Time  12    Period  Weeks    Status  On-going    Target Date  11/27/18      OT LONG TERM GOAL  #9   TITLE  Pt. will  navigate his w/c around obstacles with Supervision and 100% accuracy    Baseline  Pt. requires maxA, and heavy cuing for obstacle on the left    Time  12    Period  Weeks    Status  New    Target Date  11/27/18            Plan - 09/04/18 1704    Clinical Impression Statement The w/c representative was present in the therapy gym today, and reviewed  modification options with the hand control, left lateral support, and and head rest. Pt. is making progress with attention, left sided awareness, LUE shoulder ROM, grip strength, [place] skills, and UE self-dressing tasks.  Pt. continues to work on improving attention, and left sided awareness, LUE strength, grip strength, motor control, FMC, ADL/IADL tasks, and manuvering his W/C throughout his environment while utilizing compensatory strategies for obstacles on the left. Pt. conitnues to work on these tasks to maximize independence with ADLs, and IADLs    OT Occupational Profile and History  Problem Focused Assessment - Including review of records relating to presenting problem    Occupational performance deficits (Please refer to evaluation for details):  ADL's    Body Structure / Function / Physical Skills  UE functional use;Coordination;FMC;Dexterity;Strength;ROM    Cognitive Skills  Attention;Memory;Emotional;Problem Solve;Safety Awareness    Rehab Potential  Good    Clinical Decision Making  Several treatment options, min-mod task modification necessary    Comorbidities Affecting Occupational Performance:  Presence of comorbidities impacting occupational performance    Comorbidities impacting occupational performance description:  Phyical, cognitive, visual,  medical comorbidities    Modification or Assistance to Complete Evaluation   Min-Moderate modification of tasks or assist with assess necessary to complete eval    OT Frequency  3x / week    OT Duration  12 weeks    OT Treatment/Interventions  Self-care/ADL training;DME and/or AE instruction;Therapeutic exercise;Therapeutic activities;Moist Heat;Cognitive remediation/compensation;Neuromuscular education;Visual/perceptual remediation/compensation;Coping strategies training;Patient/family education;Passive range of motion;Psychosocial skills training;Energy conservation;Functional Mobility Training    Consulted and Agree with Plan of Care  Patient;Family member/caregiver    Family Member Consulted  Daughter [name]       Patient will benefit from skilled therapeutic intervention in order to improve the following deficits and impairments:   Body Structure /  Function / Physical Skills: UE functional use, Coordination, FMC, Dexterity, Strength, ROM Cognitive Skills: Attention, Memory, Emotional, Problem Solve, Safety Awareness     Visit Diagnosis: 1. Muscle weakness (generalized)   2. Other lack of coordination       Problem List There are no active problems to display for this patient.   Harrel Carina, MS, OTR/L 09/04/2018, 5:26 PM  [place] MAIN [facility] SERVICES [address] [place], [place], [address] Phone: [phone]   Fax:  [phone]  Name: Kenneth Summers MRN: [id number] Date of Birth: Aug 05, 1945

## 2018-09-05 ENCOUNTER — Encounter: Payer: Self-pay | Admitting: Physical Therapy

## 2018-09-05 ENCOUNTER — Ambulatory Visit: Payer: Medicare Other | Admitting: Physical Therapy

## 2018-09-05 ENCOUNTER — Ambulatory Visit: Payer: Medicare Other | Admitting: Occupational Therapy

## 2018-09-05 ENCOUNTER — Encounter: Payer: Self-pay | Admitting: Occupational Therapy

## 2018-09-05 ENCOUNTER — Other Ambulatory Visit: Payer: Self-pay

## 2018-09-05 DIAGNOSIS — R278 Other lack of coordination: Secondary | ICD-10-CM

## 2018-09-05 DIAGNOSIS — R262 Difficulty in walking, not elsewhere classified: Secondary | ICD-10-CM

## 2018-09-05 DIAGNOSIS — R296 Repeated falls: Secondary | ICD-10-CM

## 2018-09-05 DIAGNOSIS — M6281 Muscle weakness (generalized): Secondary | ICD-10-CM

## 2018-09-05 NOTE — Therapy (Signed)
[place] MAIN [facility] SERVICES [address] [place], [place], [address] Phone: [phone]   Fax:  [phone]  Physical Therapy Treatment  Patient Details  Name: Kenneth Summers MRN: [id number] Date of Birth: 02-29-1945 Referring Provider (PT): Dr. [name], Guy Begin   Encounter Date: 09/05/2018  PT End of Session - 09/05/18 1431    Visit Number  35    Number of Visits  36    Date for PT Re-Evaluation  09/11/18    Authorization Type  Medicare reporting period from 06/12/2018    Authorization Time Period  Current cert period 02/08/7251 - 09/11/2018    PT Start Time  1301    PT Stop Time  1345    PT Time Calculation (min)  44 min    Equipment Utilized During Treatment  Gait belt    Activity Tolerance  Patient limited by fatigue;Patient limited by pain    Behavior During Therapy  [facility] for tasks assessed/performed       Past Medical History:  Diagnosis Date  . Alcohol abuse   . APS (antiphospholipid syndrome) ([place])   . BPH (benign prostatic hyperplasia)   . CAD (coronary artery disease)   . Cellulitis   . Cervical spinal stenosis   . Chronic osteomyelitis ([place])   . CKD (chronic kidney disease)   . CKD (chronic kidney disease)   . Clostridium difficile diarrhea   . Depression   . Diplopia   . Fatigue   . GERD (gastroesophageal reflux disease)   . Hyperlipemia   . Hypertension   . Hypovitaminosis D   . IBS (irritable bowel syndrome)   . 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 Insomnia   . Left lumbosacral radiculopathy   . Moderate obstructive sleep apnea   . Osteomyelitis of foot ([place])   . PAD (peripheral artery disease) ([place])   . Pruritus   . RA (rheumatoid arthritis) ([place])   . Radiculopathy   . Restless legs syndrome   . RLS (restless legs syndrome)   . SLE (systemic lupus erythematosus related syndrome) ([place])   . Stroke ([place])   . Toxic maculopathy of both eyes     Past Surgical History:  Procedure Laterality Date  . CARDIAC  CATHETERIZATION    . CAROTID ENDARTERECTOMY    . CERVICAL LAMINECTOMY    . CORONARY ANGIOPLASTY    . FRACTURE SURGERY    . fusion C5-6-7    . HERNIA REPAIR    . KNEE ARTHROSCOPY    . TONSILLECTOMY      There were no vitals filed for this visit.  Subjective Assessment - 09/05/18 1430    Subjective  Patient reports doing well and feeling good today. He denies any discomfort;    Patient is accompained by:  Family member    Pertinent History  Patient is a 73 y.o. male who presents to outpatient physical therapy with a referral for medical diagnosis of CVA. This patient's chief complaints consist of left hemiplegia and overall deconditioning leading to the following functional deficits: dependent for ADLs, IADLs, unable to transfer to car, dependent transfers at home with hoyer lift, unable to walk or stand without significant assistance, difficulty with W/C navigation..    Limitations  Sitting;Lifting;Standing;House hold activities;Writing;Walking;Other (comment)    Diagnostic tests  Brain MRI 11/01/2017: IMPRESSION: 1. Acute right ACA territory infarct as demonstrated on prior CT imaging. No intracranial hemorrhage or significant mass effect. 2. 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  Patient Stated Goals  wants to be able to walk again    Currently in Pain?  No/denies    Multiple Pain Sites  No          TREATMENT:   Patient presents to therapy with  power chair. He does require min VCs for proper positioning, negotiating doorway and obstacles to get into position in gym.   He was educated on proper positioning of wheelchair prior to transfer training including removing side trunk support to avoid injuring back during transfer; Also tilted chair to forward position with chair adjusted to mid height position;   Patient instructed in sit<>stand transfers from power chair: using RW, mod A +2 progressing to min A +2, x3 reps; required mod VCs for  proper positioning and to increase forward trunk lean for better transfer ability; Patient able to scoot forward in chair well, but often reverts to leaning posteriorly especially during mid transfer requiring assistance for forward weight shift; He denies any increase in knee pain; Therapist was not required to block LLE with patient being able to stand with RLE doing most of the work. Utilized Geologist, engineering for visual cues for erect posture and weight shift in standing;   Therapist educated patient in proper standing position with instruction to increase forward weight shift over BLE feet for better erect standing position and balance. Patient stood with min A +2 for 1 min x3 reps;    Patient required frequent cues to reach back for chair prior to sitting for better safety awareness. Decreased cognition with impaired memory does affect safety awareness with out of chair safety; He was only able to reach back to arm rest 1 repetition out of the 4 transfers;    Instructed patient in squat pivot transfer wheelchair<>mat table with mod A +2 with patient requiring max VCs for hand placement and positioning; Patient able to come up to partial stand, but has significant difficulty with pivot requiring increased assistance; Transferred mat table to wheelchair with mod A +2 exhibiting better mobility with better positioning;   Patient required min A +2 for scooting back in chair and obtaining optimal position in chair;   Exercise: Patient and caregiver re-educated in HEP; Provided written handout with pictures and chart for better adherence: Seated Pelvic rocks x5 Seated scapular retraction x5 Reaching forward and then sitting upright x5 Seated Trunk rotation x5 Seated Marches x5 Seated hip abduction with yellow tband for resistance x5 Patient required min VCs for correct exercise technique and positioning with all exercise for proper exercise technique and muscle activation;    Response to  treatment: Tolerated session fair. Patient fatigued quickly within session. He verbalized understanding with advanced HEP. Patient provided with written handout for better adherence. Patient improved with sit<>Stand transfers within session today. He does continues to require cues for proper hand placement and positioning.  Patient would benefit from additional education/instruction in stand pivot transfer as he seems to have forgotten the pivot portion of the transfer with increased repetition in sit<>Stand transfers;                        PT Education - 09/05/18 1431    Education Details  transfers/positioning, strengthening/HEP reinforced;    Person(s) Educated  Patient;Child(ren)    Methods  Explanation;Demonstration;Verbal cues    Comprehension  Verbalized understanding;Returned demonstration;Verbal cues required;Need further instruction       PT Short Term Goals - 08/22/18 1326      PT SHORT TERM  GOAL #1   Title  Be independent with initial home exercise program for self-management of symptoms.    Baseline  HEP to be given at second session; advice to practice unsupported sitting at home (06/19/2018); 07/08/18: Performing at home, 6/29: needs assistance but is doing exercise program regularly; 08/22/18: adherent;    Time  2    Period  Weeks    Status  Achieved    Target Date  06/26/18        PT Long Term Goals - 08/22/18 1326      PT LONG TERM GOAL #1   Title  Patient will complete all bed mobility with min A to improve functional independence for getting in and out of bed and adjusting in bed.     Baseline  max A - total A reported by family (06/19/2018); 07/08/18: still requires maxA, dtr reports improvement in rolling since starting therapy; 6/23: min A to supervision in clinic; not doing at home right    Time  12    Period  Weeks    Status  Partially Met    Target Date  09/11/18      PT LONG TERM GOAL #2   Title  Patient will complete sit <> stand transfer  chair to chair with Min A and LRAD to improve functional independence for household and community mobility.     Baseline  max A +2 STS at W/C (06/12/2018); max A +2-3 STS or stand pivot transfer, able to complete more reps (06/19/2018); 07/08/18: not safe to attempt at this time    Time  12    Period  Weeks    Status  On-going    Target Date  09/11/18      PT LONG TERM GOAL #3   Title  Patient will navigate manual w/c with min A x 100 feet to improve mobility for household and short community distances.     Baseline  required total A (06/12/2018); able to roll 20 feet with min A (06/20/2018); 07/08/18: Pt can go approximately 10' without assist per family;    Time  12    Period  Weeks    Status  On-going    Target Date  09/11/18      PT LONG TERM GOAL #4   Title  Patient will complete car transfers W/C to family SUV with min A using LRAD to improve his abilty to participate in community activities.     Baseline  unable to complete car transfers at all (06/12/2018); unable to complete car transfers at all (06/19/2018).; 07/08/18: Unable to perform at this time    Time  12    Period  Weeks    Status  On-going    Target Date  09/11/18      PT LONG TERM GOAL #5   Title  Patient will ambulate at least 150 feet with mod I using LRAD to improve mobility for household and community distances.     Baseline  unable to take steps (06/12/2018); able to shift R leg forward and back with max A and RW at edge of plinth (06/19/2018); 07/08/18: unable to ambulate at this time.     Time  12    Period  Weeks    Status  On-going    Target Date  09/11/18      PT LONG TERM GOAL #6   Title  Patient will navigate 4 steps with min A and BUE support to improve his ability to access community  and social participation.     Baseline  unable to complet stairs (06/12/2018); unable to complete stairs (06/19/2018); 07/08/18: unable to attempt at this time    Time  12    Period  Weeks    Status  On-going    Target Date  09/11/18       PT LONG TERM GOAL #7   Title  Patient will increase functional reach test to >15 inches in sitting to exhibit improved sitting balance and positioning and reduce fall risk;    Baseline  07/30/18: 12 inches    Time  4    Period  Weeks    Status  Partially Met    Target Date  09/11/18            Plan - 09/05/18 1534    Clinical Impression Statement  Patient motivated and participated well within session. Reinforced HEP providing written HEP plan for better adherence. Patient does require cues for proper trunk positioning and LE activation for better exercise. Patient instructed in advanced transfers. He had increased difficulty with forward weight shift and with erect posture. He exhibit heavy posterior trunk lean upon standing having difficulty holding standing position for long periods of time. Patient instructed in squat pivot transfer. He was able to exhibit better pivot 2nd attempt after instruction; Patient would benefit from additional skilled PT intervention to improve strength, balance and mobility;    Personal Factors and Comorbidities  Age;Comorbidity 3+    Comorbidities  Relevant past medical history and comorbidities include long term steroid use for lupus, CKD, chronic osteomyelitis, cervical spine stenosis, BPH, APS, alcohol abuse, peripheral artery disease, depression, GERD, obstructive sleep apnea, HTN, IBS, IgA deficiency, lumbar radiculopathy, RA, Stroke, toxic maculopathy in both eyes, systemic lupus erythematosus related syndrome, cardiac catheterization, carotid endarterectomy, cervical laminectomy, coronary angioplasty, Fusion C5-C7, knee arthroplasty, lumbar surgery.    Examination-Activity Limitations  Bed Mobility;Bend;Sit;Toileting;Stand;Stairs;Lift;Transfers;Squat;Locomotion Level;Carry;Dressing;Hygiene/Grooming;Continence    Examination-Participation Restrictions  Yard Work;Interpersonal Relationship;Community Activity    Stability/Clinical Decision Making   Evolving/Moderate complexity    Rehab Potential  Fair    PT Frequency  3x / week    PT Duration  12 weeks    PT Treatment/Interventions  ADLs/Self Care Home Management;Electrical Stimulation;Moist Heat;Cryotherapy;Gait training;Stair training;Functional mobility training;Therapeutic exercise;Balance training;Neuromuscular re-education;Cognitive remediation;Patient/family education;Orthotic Fit/Training;Wheelchair mobility training;Manual techniques;Passive range of motion;Energy conservation;Joint Manipulations    PT Next Visit Plan  Work on strength, sitting/standing balance, functional activities such as rolling, bed mobility, transfers    PT Home Exercise Plan  No updates today    Consulted and Agree with Plan of Care  Patient;Family member/caregiver    Family Member Consulted  daugher [name]       Patient will benefit from skilled therapeutic intervention in order to improve the following deficits and impairments:  Abnormal gait, Decreased activity tolerance, Decreased cognition, Decreased endurance, Decreased knowledge of use of DME, Decreased range of motion, Decreased skin integrity, Decreased strength, Impaired perceived functional ability, Impaired sensation, Impaired UE functional use, Improper body mechanics, Pain, Cardiopulmonary status limiting activity, Decreased balance, Decreased coordination, Decreased mobility, Difficulty walking, Impaired tone, Postural dysfunction  Visit Diagnosis: 1. Muscle weakness (generalized)   2. Other lack of coordination   3. Difficulty in walking, not elsewhere classified   4. Repeated falls        Problem List There are no active problems to display for this patient.   Atiya Yera PT,DPT 09/05/2018, 3:43 PM  [place] MAIN REHAB SERVICES [place]  [place], [place], [address] Phone: [phone]   Fax:  [phone]  Name: Kenneth Summers MRN: [id number] Date of Birth: Mar 05, 1945

## 2018-09-05 NOTE — Patient Instructions (Signed)
Access Code: [email or web address]  URL: [email or web address]  Date: 09/05/2018  Prepared by: Blanche East   Exercises Seated Anterior Pelvic Tilt - 10 reps - 1 sets - 3x daily - 7x weekly Seated Scapular Retraction - 10 reps - 1 sets - 3x daily - 7x weekly Seated Flexion Stretch - 10 reps - 1 sets - 1x daily - 7x weekly Seated Thoracic Flexion and Rotation with Arms Crossed - 10 reps - 1 sets - 1x daily - 7x weekly Seated Hamstring Stretch - 3 reps - 1 sets - 30 hold - 1x daily

## 2018-09-05 NOTE — Therapy (Signed)
[place] MAIN [facility] SERVICES [address] [place], [place], [address] Phone: [phone]   Fax:  [phone]  Occupational Therapy Treatment  Patient Details  Name: CANDELARIO STEPPE MRN: [id number] Date of Birth: 1946/02/04 No data recorded  Encounter Date: 09/05/2018  OT End of Session - 09/05/18 1752    Visit Number  33    Number of Visits  85    Date for OT Re-Evaluation  11/27/18    Authorization Type  Progress report period starting 06/12/2018    OT Start Time  1355    OT Stop Time  1430    OT Time Calculation (min)  35 min    Activity Tolerance  Patient tolerated treatment well    Behavior During Therapy  [facility] for tasks assessed/performed       Past Medical History:  Diagnosis Date  . Alcohol abuse   . APS (antiphospholipid syndrome) ([place])   . BPH (benign prostatic hyperplasia)   . CAD (coronary artery disease)   . Cellulitis   . Cervical spinal stenosis   . Chronic osteomyelitis ([place])   . CKD (chronic kidney disease)   . CKD (chronic kidney disease)   . Clostridium difficile diarrhea   . Depression   . Diplopia   . Fatigue   . GERD (gastroesophageal reflux disease)   . Hyperlipemia   . Hypertension   . Hypovitaminosis D   . IBS (irritable bowel syndrome)   . 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 Insomnia   . Left lumbosacral radiculopathy   . Moderate obstructive sleep apnea   . Osteomyelitis of foot ([place])   . PAD (peripheral artery disease) ([place])   . Pruritus   . RA (rheumatoid arthritis) ([place])   . Radiculopathy   . Restless legs syndrome   . RLS (restless legs syndrome)   . SLE (systemic lupus erythematosus related syndrome) ([place])   . Stroke ([place])   . Toxic maculopathy of both eyes     Past Surgical History:  Procedure Laterality Date  . CARDIAC CATHETERIZATION    . CAROTID ENDARTERECTOMY    . CERVICAL LAMINECTOMY    . CORONARY ANGIOPLASTY    . FRACTURE SURGERY    . fusion C5-6-7    . HERNIA REPAIR    . KNEE  ARTHROSCOPY    . TONSILLECTOMY      There were no vitals filed for this visit.  Subjective Assessment - 09/05/18 1751    Subjective   Pt. continues to use his new electric chair.    Patient is accompanied by:  Family member    Pertinent History  Pt. is a 73 y.o. 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 Pt. PMHx includes: Multiple Falls, Lupus, DJD, Renal Abscess, CVA. Pt. resides with his wife. Pt.'s wife and daughter assist with ADLs. Pt. has caregivers in  for 2 hours a day, 6 days a week. Pt. received Rehab services in acute care, at SNF for STR, and [place] services. Pt. is retired from [employer] for [employer] and [employer].    Currently in Pain?  No/denies      OT TREATMENT    Neuro muscular re-education:  Pt. Worked on pinch strengthening in the left hand for lateral, and 3pt. pinch using yellow, and redresistive clips. Pt. worked on placing the clips at various vertical and horizontal angles with emphasis placed on placing the clips onto numbers positioned to the left, and far left.  Verbal, and visual cues were required for eliciting the desired movement. Pt. Worked on reaching with the left for the clips while engaging his trunk in flexing forward.  Therapeutic Exercise:  Pt. performed 2# dowel ex. For UE strengthening secondary to weakness. Bilateral shoulder flexion, chest press, circular patterns. Cues were required to keep the dowel straight during the exercises. 2# dumbbell ex. For left elbow flexion and extension, forearm supination/pronation, and wrist flexion/extension. Pt. requires rest breaks and verbal cues for the proper technique.                          OT Education - 09/05/18 1752    Education Details  Left hand functioning    Person(s) Educated  Patient;Child(ren)    Methods  Explanation;Demonstration    Comprehension  Verbalized understanding;Returned demonstration          OT Long Term  Goals - 09/04/18 1425      OT LONG TERM GOAL #1   Title  Pt. will increase left shoulder flexion AROM by 10 degrees to access cabinet/shelf.    Baseline  AROM left shoulder flexion: 108    Time  12    Period  Weeks    Status  On-going    Target Date  11/27/18      OT LONG TERM GOAL #2   Title  Pt. will donn a shirt with Supervision.    Baseline  Min-ModA    Time  12    Period  Weeks    Status  On-going    Target Date  11/27/18      OT LONG TERM GOAL #3   Title  Pt. will require ModA to perform LE dressing    Baseline  MaxA left, ModA with the right    Time  12    Period  Weeks    Status  On-going    Target Date  11/27/18      OT LONG TERM GOAL #4   Title  Pt. will improve left grip strength by 10# to be able to open a jar/container.    Baseline  Left: 30, Right 40    Time  12    Period  Weeks    Status  On-going    Target Date  11/27/18      OT LONG TERM GOAL #5   Title  Pt. will improve left hand [facility] skills to be able to assit with buttoning/zipping.    Baseline  Left: 1 min & 50 sec. R: 56 sec.    Time  12    Period  Weeks    Status  New    Target Date  11/27/18      OT LONG TERM GOAL #6   Title  Pt. will demonstrate visual conmpensatory strategies during 100% of the time during ADLs    Baseline  Pt. requires min-mod for left side awareness for tabletop tasks in his near space, Max cues in the extrapersonal space    Time  12    Period  Weeks    Status  On-going    Target Date  11/27/18      OT LONG TERM GOAL #7   Title  Pt. will prepare a simple cold snack from w/c with supervision using cognitive compensatory strategies 100% of the time.    Baseline  Pt. is unable    Time  12    Period  Weeks    Status  On-going    Target Date  11/27/18      OT LONG TERM GOAL #8   Title  Pt. will accurately identify potential safety hazard using good safety awareness, and judgement 100% for ADLs, and IADLs.    Baseline  limited    Time  12    Period  Weeks    Status   On-going    Target Date  11/27/18      OT LONG TERM GOAL  #9   TITLE  Pt. will  navigate his w/c around obstacles with Supervision and 100% accuracy    Baseline  Pt. requires maxA, and heavy cuing for obstacle on the left    Time  12    Period  Weeks    Status  New    Target Date  11/27/18            Plan - 09/05/18 1753    Clinical Impression Statement Pt. has bruises throughout his left forearm from bumping into walls, and furniture at home with his new electric w/c. The left armrest of the chair is scratched, and has a tear in the fabric. Pt. conitnues to require practice on maneuvering the chair in his environment, around obstacles, and through doorways. Pt. continues to make progress, and requires continued work on improving attention, left sided awareness, and coordination skills in order to improve engagement of the LUE during daily tasks, and maximize independence with ADLs, and IADL functioning.    OT Occupational Profile and History  Problem Focused Assessment - Including review of records relating to presenting problem    Occupational performance deficits (Please refer to evaluation for details):  ADL's    Body Structure / Function / Physical Skills  UE functional use;Coordination;FMC;Dexterity;Strength;ROM    Cognitive Skills  Attention;Memory;Emotional;Problem Solve;Safety Awareness    Rehab Potential  Good    Clinical Decision Making  Several treatment options, min-mod task modification necessary    Comorbidities impacting occupational performance description:  Phyical, cognitive, visual,  medical comorbidities    Modification or Assistance to Complete Evaluation   Min-Moderate modification of tasks or assist with assess necessary to complete eval    OT Frequency  3x / week    OT Duration  12 weeks    OT Treatment/Interventions  Self-care/ADL training;DME and/or AE instruction;Therapeutic exercise;Therapeutic activities;Moist Heat;Cognitive  remediation/compensation;Neuromuscular education;Visual/perceptual remediation/compensation;Coping strategies training;Patient/family education;Passive range of motion;Psychosocial skills training;Energy conservation;Functional Mobility Training    Family Member Consulted  Daughter [name]       Patient will benefit from skilled therapeutic intervention in order to improve the following deficits and impairments:   Body Structure / Function / Physical Skills: UE functional use, Coordination, FMC, Dexterity, Strength, ROM Cognitive Skills: Attention, Memory, Emotional, Problem Solve, Safety Awareness     Visit Diagnosis: 1. Muscle weakness (generalized)   2. Other lack of coordination       Problem List There are no active problems to display for this patient.   Harrel Carina, MS, OTR/L 09/05/2018, 6:02 PM  [place] MAIN [facility] SERVICES [address] [place], [place], [address] Phone: [phone]   Fax:  [phone]  Name: JAYCUB NOORANI MRN: [id number] Date of Birth: 04-10-1945

## 2018-09-09 ENCOUNTER — Ambulatory Visit: Payer: Medicare Other

## 2018-09-09 ENCOUNTER — Ambulatory Visit: Payer: Medicare Other | Admitting: Occupational Therapy

## 2018-09-10 ENCOUNTER — Encounter: Payer: Medicare Other | Admitting: Occupational Therapy

## 2018-09-10 ENCOUNTER — Ambulatory Visit: Payer: Medicare Other | Admitting: Physical Therapy

## 2018-09-11 ENCOUNTER — Ambulatory Visit: Payer: Medicare Other | Attending: Family Medicine | Admitting: Occupational Therapy

## 2018-09-11 ENCOUNTER — Encounter: Payer: Self-pay | Admitting: Occupational Therapy

## 2018-09-11 ENCOUNTER — Other Ambulatory Visit: Payer: Self-pay

## 2018-09-11 ENCOUNTER — Ambulatory Visit: Payer: Medicare Other

## 2018-09-11 DIAGNOSIS — M6281 Muscle weakness (generalized): Secondary | ICD-10-CM

## 2018-09-11 DIAGNOSIS — H547 Unspecified visual loss: Secondary | ICD-10-CM | POA: Diagnosis present

## 2018-09-11 DIAGNOSIS — R278 Other lack of coordination: Secondary | ICD-10-CM

## 2018-09-11 DIAGNOSIS — R262 Difficulty in walking, not elsewhere classified: Secondary | ICD-10-CM | POA: Diagnosis present

## 2018-09-11 NOTE — Therapy (Signed)
[place] MAIN [facility] SERVICES [address] [place], [place], [address] Phone: [phone]   Fax:  [phone]  Physical Therapy Treatment/Recertification  Patient Details  Name: Kenneth Summers MRN: [id number] Date of Birth: Nov 24, 1945 Referring Provider (PT): Dr. [name], Guy Begin   Encounter Date: 09/11/2018  PT End of Session - 09/11/18 1659    Visit Number  [id number]    Number of Visits  84    Date for PT Re-Evaluation  12/04/18    Authorization Type  Medicare reporting period starting 09/11/18    Authorization Time Period  Current cert period 09/08/3381-29/19/16    PT Start Time  1350    PT Stop Time  1440    PT Time Calculation (min)  50 min    Equipment Utilized During Treatment  Gait belt    Activity Tolerance  Patient limited by fatigue;Patient limited by pain    Behavior During Therapy  [facility] for tasks assessed/performed       Past Medical History:  Diagnosis Date  . Alcohol abuse   . APS (antiphospholipid syndrome) ([place])   . BPH (benign prostatic hyperplasia)   . CAD (coronary artery disease)   . Cellulitis   . Cervical spinal stenosis   . Chronic osteomyelitis ([place])   . CKD (chronic kidney disease)   . CKD (chronic kidney disease)   . Clostridium difficile diarrhea   . Depression   . Diplopia   . Fatigue   . GERD (gastroesophageal reflux disease)   . Hyperlipemia   . Hypertension   . Hypovitaminosis D   . IBS (irritable bowel syndrome)   . 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 Insomnia   . Left lumbosacral radiculopathy   . Moderate obstructive sleep apnea   . Osteomyelitis of foot ([place])   . PAD (peripheral artery disease) ([place])   . Pruritus   . RA (rheumatoid arthritis) ([place])   . Radiculopathy   . Restless legs syndrome   . RLS (restless legs syndrome)   . SLE (systemic lupus erythematosus related syndrome) ([place])   . Stroke ([place])   . Toxic maculopathy of both eyes     Past Surgical History:  Procedure Laterality Date  .  CARDIAC CATHETERIZATION    . CAROTID ENDARTERECTOMY    . CERVICAL LAMINECTOMY    . CORONARY ANGIOPLASTY    . FRACTURE SURGERY    . fusion C5-6-7    . HERNIA REPAIR    . KNEE ARTHROSCOPY    . TONSILLECTOMY      There were no vitals filed for this visit.  Subjective Assessment - 09/11/18 1356    Subjective  Patient presents with a R foot wound over the R medial malleolus with a blood blister and diffuse bruising above and below the blister.  He states that he hit his leg on the brick entry way on Sunday. Daughter reports that he has been complaining of R ankle pain when resting foot on wheelchair leg rests.    Patient is accompained by:  Family member    Pertinent History  Patient is a 73 y.o. male who presents to outpatient physical therapy with a referral for medical diagnosis of CVA. This patient's chief complaints consist of left hemiplegia and overall deconditioning leading to the following functional deficits: dependent for ADLs, IADLs, unable to transfer to car, dependent transfers at home with hoyer lift, unable to walk or stand without significant assistance, difficulty with W/C navigation..    Limitations  Sitting;Lifting;Standing;House hold  activities;Writing;Walking;Other (comment)    Diagnostic tests  Brain MRI 11/01/2017: IMPRESSION: 1. Acute right ACA territory infarct as demonstrated on prior CT imaging. No intracranial hemorrhage or significant mass effect. 2. 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    Patient Stated Goals  wants to be able to walk again    Currently in Pain?  Yes    Pain Score  2     Pain Location  Ankle    Pain Orientation  Right    Pain Descriptors / Indicators  Aching;Sore    Pain Type  Acute pain    Pain Onset  In the past 7 days    Pain Frequency  Intermittent        TREATMENT:     Neuromuscular Re-education   Dynamic sitting balance with patient reaching for a variety of targets as determined by therapist.  Alternated upper extremities and focused especially on challenging lateral and forward reach. Pt also encouraged to reach across midline and improve trunk rotation.  STSs from power chair with RW mod Ax2 to come to stand with max cueing for hand placement, leaning forward, and standing up tall. Performed twice with patient. He also requires cueing and assistance with scooting forward to edge of power chair including lateral weight shifting. Pt struggles more today with anterior weight shifting during transfers.  Squat pivot transfer to/from mat table mod Ax 2 with occasional maxA. Transferred toward the right during both transfers. Third person present as well for additional safety. Pt struggles today with anterior weight shifting as well as frequently leaning backwards in the middle of the transfers.      There-ex:   Seated Marches x10 bilaterally, extensive cues and redirection required;  Seated hip adduction ball squeezes x10 bilaterally, verbal and tactile cues required;  Seated hip abduction with manual resistance x10 bilaterally, frequent redirection and verbal cues required.      Pt continues to display good motivation throughout PT sessions.  He has met 1 short term goals but has not met any long term goals to date.  He has made improvements with his ability to roll to complete bed mobility, but still requires mod/maxA with remainder of bed mobility.  In PT sessions, he is able to complete a STS and squat pivot transfer from a power WC using a RW with modA+2 which indicates improvement from last assessment. He is unable to safely ambulate with a RW at time time.  He is also unable to safely perform stairs at this time.  Pt will benefit from continued skilled PT services in order to increase strength and functional mobility in order to decrease caregiver burden, improve his ability to perform ADLs, and to improve his QOL.    PT Short Term Goals - 09/11/18 1704      PT SHORT TERM GOAL  #1   Title  Be independent with initial home exercise program for self-management of symptoms.    Baseline  HEP to be given at second session; advice to practice unsupported sitting at home (06/19/2018); 07/08/18: Performing at home, 6/29: needs assistance but is doing exercise program regularly; 08/22/18: adherent;    Time  2    Period  Weeks    Status  Achieved    Target Date  06/26/18        PT Long Term Goals - 09/11/18 1704      PT LONG TERM GOAL #1   Title  Patient will complete all bed mobility with  min A to improve functional independence for getting in and out of bed and adjusting in bed.     Baseline  max A - total A reported by family (06/19/2018); 07/08/18: still requires maxA, dtr reports improvement in rolling since starting therapy; 6/23: min A to supervision in clinic; not doing at home right; 8/5: Pt's daughter states that his rolling has improved, but still has difficulty with all other bed mobility    Time  12    Period  Weeks    Status  Partially Met    Target Date  12/04/18      PT LONG TERM GOAL #2   Title  Patient will complete sit <> stand transfer chair to chair with Min A and LRAD to improve functional independence for household and community mobility.     Baseline  max A +2 STS at W/C (06/12/2018); max A +2-3 STS or stand pivot transfer, able to complete more reps (06/19/2018); 07/08/18: not safe to attempt at this time; 09/11/18: requires maxA+1 or modA+2 for STSs and squat pivot transfer    Time  12    Period  Weeks    Status  On-going    Target Date  12/04/18      PT LONG TERM GOAL #3   Title  Patient will navigate manual w/c with min A x 100 feet to improve mobility for household and short community distances.     Baseline  required total A (06/12/2018); able to roll 20 feet with min A (06/20/2018); 07/08/18: Pt can go approximately 10' without assist per family;    Time  12    Period  Weeks    Status  On-going    Target Date  12/04/18      PT LONG TERM GOAL #4    Title  Patient will complete car transfers W/C to family SUV with min A using LRAD to improve his abilty to participate in community activities.     Baseline  unable to complete car transfers at all (06/12/2018); unable to complete car transfers at all (06/19/2018).; 07/08/18: Unable to perform at this time; 09/11/18: unable to perform at this time    Time  12    Period  Weeks    Status  On-going    Target Date  12/04/18      PT LONG TERM GOAL #5   Title  Patient will ambulate at least 150 feet with mod I using LRAD to improve mobility for household and community distances.     Baseline  unable to take steps (06/12/2018); able to shift R leg forward and back with max A and RW at edge of plinth (06/19/2018); 07/08/18: unable to ambulate at this time. 09/11/18: unable to perform at this time    Time  12    Period  Weeks    Status  On-going    Target Date  12/04/18      Additional Long Term Goals   Additional Long Term Goals  Yes      PT LONG TERM GOAL #6   Title  Patient will navigate 4 steps with min A and BUE support to improve his ability to access community and social participation.     Baseline  unable to complet stairs (06/12/2018); unable to complete stairs (06/19/2018); 07/08/18: unable to attempt at this time; 09/11/18: unsafe to attempt at this time    Time  12    Period  Weeks    Status  On-going  Target Date  12/04/18      PT LONG TERM GOAL #7   Title  Patient will increase functional reach test to >15 inches in sitting to exhibit improved sitting balance and positioning and reduce fall risk;    Baseline  07/30/18: 12 inches; 09/11/18: 10-12 inches    Time  4    Period  Weeks    Status  Partially Met    Target Date  12/04/18            Plan - 09/11/18 1722    Clinical Impression Statement  Pt continues to display good motivation throughout PT sessions.  He has met 1 short term goals but has not met any long term goals to date.  He has made improvements with his ability to roll to  complete bed mobility, but still requires mod/maxA with remainder of bed mobility.  In PT sessions, he is able to complete a STS and squat pivot transfer from a power WC using a RW with modA+2 which indicates improvement from last assessment. He is unable to safely ambulate with a RW at time time.  He is also unable to safely perform stairs at this time.  Pt will benefit from continued skilled PT services in order to increase strength and functional mobility in order to decrease caregiver burden, improve his ability to perform ADLs, and to improve his QOL.    Personal Factors and Comorbidities  Age;Comorbidity 3+    Comorbidities  Relevant past medical history and comorbidities include long term steroid use for lupus, CKD, chronic osteomyelitis, cervical spine stenosis, BPH, APS, alcohol abuse, peripheral artery disease, depression, GERD, obstructive sleep apnea, HTN, IBS, IgA deficiency, lumbar radiculopathy, RA, Stroke, toxic maculopathy in both eyes, systemic lupus erythematosus related syndrome, cardiac catheterization, carotid endarterectomy, cervical laminectomy, coronary angioplasty, Fusion C5-C7, knee arthroplasty, lumbar surgery.    Examination-Activity Limitations  Bed Mobility;Bend;Sit;Toileting;Stand;Stairs;Lift;Transfers;Squat;Locomotion Level;Carry;Dressing;Hygiene/Grooming;Continence    Examination-Participation Restrictions  Yard Work;Interpersonal Relationship;Community Activity    Stability/Clinical Decision Making  Evolving/Moderate complexity    Rehab Potential  Fair    PT Frequency  3x / week    PT Duration  12 weeks    PT Treatment/Interventions  ADLs/Self Care Home Management;Electrical Stimulation;Moist Heat;Cryotherapy;Gait training;Stair training;Functional mobility training;Therapeutic exercise;Balance training;Neuromuscular re-education;Cognitive remediation;Patient/family education;Orthotic Fit/Training;Wheelchair mobility training;Manual techniques;Passive range of  motion;Energy conservation;Joint Manipulations    PT Next Visit Plan  Work on strength, sitting/standing balance, functional activities such as rolling, bed mobility, transfers    PT Home Exercise Plan  No updates today    Consulted and Agree with Plan of Care  Patient;Family member/caregiver    Family Member Consulted  daugher [name]       Patient will benefit from skilled therapeutic intervention in order to improve the following deficits and impairments:  Abnormal gait, Decreased activity tolerance, Decreased cognition, Decreased endurance, Decreased knowledge of use of DME, Decreased range of motion, Decreased skin integrity, Decreased strength, Impaired perceived functional ability, Impaired sensation, Impaired UE functional use, Improper body mechanics, Pain, Cardiopulmonary status limiting activity, Decreased balance, Decreased coordination, Decreased mobility, Difficulty walking, Impaired tone, Postural dysfunction  Visit Diagnosis: 1. Muscle weakness (generalized)   2. Other lack of coordination   3. Difficulty in walking, not elsewhere classified        Problem List There are no active problems to display for this patient.   This entire session was performed under direct supervision and direction of a licensed therapist/therapist assistant . I have personally read, edited and approve of the note  as written.   Lutricia Horsfall, SPT [facility]out PT, DPT, GCS  Huprich,Jason 09/12/2018, 8:52 AM  [place] MAIN [facility] SERVICES [address] [place], [place], [address] Phone: [phone]   Fax:  [phone]  Name: BRYSEN SHANKMAN MRN: [id number] Date of Birth: 07-Jan-1946

## 2018-09-12 ENCOUNTER — Other Ambulatory Visit: Payer: Self-pay

## 2018-09-12 ENCOUNTER — Ambulatory Visit: Payer: Medicare Other | Admitting: Occupational Therapy

## 2018-09-12 ENCOUNTER — Ambulatory Visit: Payer: Medicare Other

## 2018-09-12 ENCOUNTER — Encounter: Payer: Self-pay | Admitting: Occupational Therapy

## 2018-09-12 DIAGNOSIS — H547 Unspecified visual loss: Secondary | ICD-10-CM

## 2018-09-12 DIAGNOSIS — M6281 Muscle weakness (generalized): Secondary | ICD-10-CM

## 2018-09-12 DIAGNOSIS — R278 Other lack of coordination: Secondary | ICD-10-CM

## 2018-09-12 DIAGNOSIS — R262 Difficulty in walking, not elsewhere classified: Secondary | ICD-10-CM

## 2018-09-12 NOTE — Therapy (Signed)
[place] MAIN [facility] SERVICES [address] [place], [place], [address] Phone: [phone]   Fax:  [phone]  Physical Therapy Treatment  Patient Details  Name: Kenneth Summers MRN: [id number] Date of Birth: 09-Jul-1945 Referring Provider (PT): Dr. [name], Guy Begin   Encounter Date: 09/12/2018  PT End of Session - 09/12/18 1706    Visit Number  37    Number of Visits  95    Date for PT Re-Evaluation  12/04/18    Authorization Type  Medicare reporting period starting 09/11/18    Authorization Time Period  Current cert period 07/12/628-16/01/09    PT Start Time  1346    PT Stop Time  1432    PT Time Calculation (min)  46 min    Equipment Utilized During Treatment  Gait belt    Activity Tolerance  Patient limited by fatigue;Patient limited by pain    Behavior During Therapy  [facility] for tasks assessed/performed       Past Medical History:  Diagnosis Date  . Alcohol abuse   . APS (antiphospholipid syndrome) ([place])   . BPH (benign prostatic hyperplasia)   . CAD (coronary artery disease)   . Cellulitis   . Cervical spinal stenosis   . Chronic osteomyelitis ([place])   . CKD (chronic kidney disease)   . CKD (chronic kidney disease)   . Clostridium difficile diarrhea   . Depression   . Diplopia   . Fatigue   . GERD (gastroesophageal reflux disease)   . Hyperlipemia   . Hypertension   . Hypovitaminosis D   . IBS (irritable bowel syndrome)   . 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 Insomnia   . Left lumbosacral radiculopathy   . Moderate obstructive sleep apnea   . Osteomyelitis of foot ([place])   . PAD (peripheral artery disease) ([place])   . Pruritus   . RA (rheumatoid arthritis) ([place])   . Radiculopathy   . Restless legs syndrome   . RLS (restless legs syndrome)   . SLE (systemic lupus erythematosus related syndrome) ([place])   . Stroke ([place])   . Toxic maculopathy of both eyes     Past Surgical History:  Procedure Laterality Date  . CARDIAC  CATHETERIZATION    . CAROTID ENDARTERECTOMY    . CERVICAL LAMINECTOMY    . CORONARY ANGIOPLASTY    . FRACTURE SURGERY    . fusion C5-6-7    . HERNIA REPAIR    . KNEE ARTHROSCOPY    . TONSILLECTOMY      There were no vitals filed for this visit.  Subjective Assessment - 09/12/18 1645    Subjective  Patient reports that the blood blister on his ankle popped last night and that he has felt some relief from the discomfort he was feeling.  Pt reports no soreness after last session.  Neither he nor his daughter have any new questions or concerns at this time.    Patient is accompained by:  Family member    Pertinent History  Patient is a 73 y.o. male who presents to outpatient physical therapy with a referral for medical diagnosis of CVA. This patient's chief complaints consist of left hemiplegia and overall deconditioning leading to the following functional deficits: dependent for ADLs, IADLs, unable to transfer to car, dependent transfers at home with hoyer lift, unable to walk or stand without significant assistance, difficulty with W/C navigation..    Limitations  Sitting;Lifting;Standing;House hold activities;Writing;Walking;Other (comment)    Diagnostic tests  Brain  MRI 11/01/2017: IMPRESSION: 1. Acute right ACA territory infarct as demonstrated on prior CT imaging. No intracranial hemorrhage or significant mass effect. 2. 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    Patient Stated Goals  wants to be able to walk again    Currently in Pain?  No/denies         TREATMENT  Neuromuscular Re-education   Dynamic sitting balance with patient reaching for a variety of targets as determined by therapist. Alternated upper extremities and focused especially on challenging lateral and forward reach. Pt also encouraged to reach across midline and improve trunk rotation.  Min to [facility] provided throughout for L lateral lean and posterior weight shift.  STSs from  elevated therapy mat using a RW.  Mod Ax2 to come to stand with max cueing for leaning forward and standing up tall.  He demonstrated good carry over from yesterday with hand placement on the RW and therapy mat in order to initiate stand. Performed twice with patient. He continues to struggle with anterior weight shifting once in standing and his standing endurance is not as good as it has been previously.  Squat pivot transfer to/from mat table mod Ax 2 with occasional maxA. Transferred toward the right during both transfers. Third person present as well for additional safety. Pt struggles today with anterior weight shifting as well as frequently leaning backwards in the middle of the transfers.   Ball roll outs with large physioball x10 in order to encourage anterior weight shift and trunk control.  CGA with occasional min-modA provided throughout for L lateral lean.       There-ex:   Seated Marches x10 bilaterally, extensive cues and redirection required;  Seated knee extensions x10 bilaterally; required assistance with LLE to initiate and complete exercise.  Seated hip adduction with manual resistance x10 bilaterally, verbal and tactile cues required;  Seated hip abduction with manual resistance x10 bilaterally, frequent redirection and verbal cues required.   Physioball press downs used to initiate core activation.  Pt was instructed to press down with his UEs on the ball that was place in his lap.  x10 with a 3 second hold.  CGA with occasional minA provided for L lateral lean.    Pt continues to display good motivation throughout PT sessions. He demonstrates carryover in his ability to recall his hand placement on the the RW and therapy mat for STSs today.  He demonstrates improvements with his forward and lateral reaching, but still has difficulty with the dual task component of maintaining an upright posture, with a tendency to laterally lean L and to posteriorly weight shift  between reaching for targets.  He responded well to ball roll outs, demonstrating a good anterior weight shift. CGA provided throughout with intermittent min/modA to prevent posterior weight shift and L lateral lean.    Pt will benefit from continued skilled PT services in order to increase strength and functional mobility in order to decrease caregiver burden, improve his ability to perform ADLs, and to improve his QOL.     PT Short Term Goals - 09/11/18 1704      PT SHORT TERM GOAL #1   Title  Be independent with initial home exercise program for self-management of symptoms.    Baseline  HEP to be given at second session; advice to practice unsupported sitting at home (06/19/2018); 07/08/18: Performing at home, 6/29: needs assistance but is doing exercise program regularly; 08/22/18: adherent;    Time  2  Period  Weeks    Status  Achieved    Target Date  06/26/18        PT Long Term Goals - 09/11/18 1704      PT LONG TERM GOAL #1   Title  Patient will complete all bed mobility with min A to improve functional independence for getting in and out of bed and adjusting in bed.     Baseline  max A - total A reported by family (06/19/2018); 07/08/18: still requires maxA, dtr reports improvement in rolling since starting therapy; 6/23: min A to supervision in clinic; not doing at home right; 8/5: Pt's daughter states that his rolling has improved, but still has difficulty with all other bed mobility    Time  12    Period  Weeks    Status  Partially Met    Target Date  12/04/18      PT LONG TERM GOAL #2   Title  Patient will complete sit <> stand transfer chair to chair with Min A and LRAD to improve functional independence for household and community mobility.     Baseline  max A +2 STS at W/C (06/12/2018); max A +2-3 STS or stand pivot transfer, able to complete more reps (06/19/2018); 07/08/18: not safe to attempt at this time; 09/11/18: requires maxA+1 or modA+2 for STSs and squat pivot transfer     Time  12    Period  Weeks    Status  On-going    Target Date  12/04/18      PT LONG TERM GOAL #3   Title  Patient will navigate manual w/c with min A x 100 feet to improve mobility for household and short community distances.     Baseline  required total A (06/12/2018); able to roll 20 feet with min A (06/20/2018); 07/08/18: Pt can go approximately 10' without assist per family;    Time  12    Period  Weeks    Status  On-going    Target Date  12/04/18      PT LONG TERM GOAL #4   Title  Patient will complete car transfers W/C to family SUV with min A using LRAD to improve his abilty to participate in community activities.     Baseline  unable to complete car transfers at all (06/12/2018); unable to complete car transfers at all (06/19/2018).; 07/08/18: Unable to perform at this time; 09/11/18: unable to perform at this time    Time  12    Period  Weeks    Status  On-going    Target Date  12/04/18      PT LONG TERM GOAL #5   Title  Patient will ambulate at least 150 feet with mod I using LRAD to improve mobility for household and community distances.     Baseline  unable to take steps (06/12/2018); able to shift R leg forward and back with max A and RW at edge of plinth (06/19/2018); 07/08/18: unable to ambulate at this time. 09/11/18: unable to perform at this time    Time  12    Period  Weeks    Status  On-going    Target Date  12/04/18      Additional Long Term Goals   Additional Long Term Goals  Yes      PT LONG TERM GOAL #6   Title  Patient will navigate 4 steps with min A and BUE support to improve his ability to access community and social  participation.     Baseline  unable to complet stairs (06/12/2018); unable to complete stairs (06/19/2018); 07/08/18: unable to attempt at this time; 09/11/18: unsafe to attempt at this time    Time  12    Period  Weeks    Status  On-going    Target Date  12/04/18      PT LONG TERM GOAL #7   Title  Patient will increase functional reach test to >15 inches in  sitting to exhibit improved sitting balance and positioning and reduce fall risk;    Baseline  07/30/18: 12 inches; 09/11/18: 10-12 inches    Time  4    Period  Weeks    Status  Partially Met    Target Date  12/04/18            Plan - 09/12/18 1706    Clinical Impression Statement  Pt continues to display good motivation throughout PT sessions. He demonstrates carryover in his ability to recall his hand placement on the the RW and therapy mat for STSs today.  He demonstrates improvements with his forward and lateral reaching, but still has difficulty with the dual task component of maintaining an upright posture, with a tendency to laterally lean L and to posteriorly weight shift between reaching for targets.  He responded well to ball roll outs, demonstrating a good anterior weight shift. CGA provided throughout with intermittent min/modA to prevent posterior weight shift and L lateral lean.    Pt will benefit from continued skilled PT services in order to increase strength and functional mobility in order to decrease caregiver burden, improve his ability to perform ADLs, and to improve his QOL.    Personal Factors and Comorbidities  Age;Comorbidity 3+    Comorbidities  Relevant past medical history and comorbidities include long term steroid use for lupus, CKD, chronic osteomyelitis, cervical spine stenosis, BPH, APS, alcohol abuse, peripheral artery disease, depression, GERD, obstructive sleep apnea, HTN, IBS, IgA deficiency, lumbar radiculopathy, RA, Stroke, toxic maculopathy in both eyes, systemic lupus erythematosus related syndrome, cardiac catheterization, carotid endarterectomy, cervical laminectomy, coronary angioplasty, Fusion C5-C7, knee arthroplasty, lumbar surgery.    Examination-Activity Limitations  Bed Mobility;Bend;Sit;Toileting;Stand;Stairs;Lift;Transfers;Squat;Locomotion Level;Carry;Dressing;Hygiene/Grooming;Continence    Examination-Participation Restrictions  Yard  Work;Interpersonal Relationship;Community Activity    Stability/Clinical Decision Making  Evolving/Moderate complexity    Rehab Potential  Fair    PT Frequency  3x / week    PT Duration  12 weeks    PT Treatment/Interventions  ADLs/Self Care Home Management;Electrical Stimulation;Moist Heat;Cryotherapy;Gait training;Stair training;Functional mobility training;Therapeutic exercise;Balance training;Neuromuscular re-education;Cognitive remediation;Patient/family education;Orthotic Fit/Training;Wheelchair mobility training;Manual techniques;Passive range of motion;Energy conservation;Joint Manipulations    PT Next Visit Plan  Work on strength, sitting/standing balance, functional activities such as rolling, bed mobility, transfers    PT [place] tracking sheet provided via Beth today    Consulted and Agree with Plan of Care  Patient;Family member/caregiver    Family Member Consulted  daugher [name]       Patient will benefit from skilled therapeutic intervention in order to improve the following deficits and impairments:  Abnormal gait, Decreased activity tolerance, Decreased cognition, Decreased endurance, Decreased knowledge of use of DME, Decreased range of motion, Decreased skin integrity, Decreased strength, Impaired perceived functional ability, Impaired sensation, Impaired UE functional use, Improper body mechanics, Pain, Cardiopulmonary status limiting activity, Decreased balance, Decreased coordination, Decreased mobility, Difficulty walking, Impaired tone, Postural dysfunction  Visit Diagnosis: 1. Other lack of coordination   2. Muscle weakness (generalized)  3. Difficulty in walking, not elsewhere classified        Problem List There are no active problems to display for this patient.   This entire session was performed under direct supervision and direction of a licensed therapist/therapist assistant . I have personally read, edited and approve of the note as  written.   Lutricia Horsfall, SPT Phillips Grout PT, DPT, GCS  Huprich,Jason 09/13/2018, 11:14 AM  [place] MAIN [facility] SERVICES [address] [place], [place], [address] Phone: [phone]   Fax:  [phone]  Name: Kenneth Summers MRN: [id number] Date of Birth: March 20, 1945

## 2018-09-12 NOTE — Therapy (Signed)
[place] MAIN [facility] SERVICES [address] [place], [place], [address] Phone: [phone]   Fax:  [phone]  Occupational Therapy Treatment  Patient Details  Name: Kenneth Summers MRN: [id number] Date of Birth: [birth year] No data recorded  Encounter Date: 09/12/2018  OT End of Session - 09/12/18 1357    Visit Number  35    Number of Visits  48    Date for OT Re-Evaluation  11/27/18    Authorization Type  Progress report period starting 06/12/2018    OT Start Time  1300    OT Stop Time  1345    OT Time Calculation (min)  45 min    Activity Tolerance  Patient tolerated treatment well    Behavior During Therapy  [facility] for tasks assessed/performed       Past Medical History:  Diagnosis Date  . Alcohol abuse   . APS (antiphospholipid syndrome) ([place])   . BPH (benign prostatic hyperplasia)   . CAD (coronary artery disease)   . Cellulitis   . Cervical spinal stenosis   . Chronic osteomyelitis ([place])   . CKD (chronic kidney disease)   . CKD (chronic kidney disease)   . Clostridium difficile diarrhea   . Depression   . Diplopia   . Fatigue   . GERD (gastroesophageal reflux disease)   . Hyperlipemia   . Hypertension   . Hypovitaminosis D   . IBS (irritable bowel syndrome)   . IgA deficiency ([place])   . Insomnia   . Left lumbosacral radiculopathy   . Moderate obstructive sleep apnea   . Osteomyelitis of foot ([place])   . PAD (peripheral artery disease) ([place])   . Pruritus   . RA (rheumatoid arthritis) ([place])   . Radiculopathy   . Restless legs syndrome   . RLS (restless legs syndrome)   . SLE (systemic lupus erythematosus related syndrome) ([place])   . Stroke ([place])   . Toxic maculopathy of both eyes     Past Surgical History:  Procedure Laterality Date  . CARDIAC CATHETERIZATION    . CAROTID ENDARTERECTOMY    . CERVICAL LAMINECTOMY    . CORONARY ANGIOPLASTY    . FRACTURE SURGERY    . fusion C5-6-7    . HERNIA REPAIR    . KNEE  ARTHROSCOPY    . TONSILLECTOMY      There were no vitals filed for this visit.  Subjective Assessment - 09/12/18 1355    Subjective   Patient reported that he was doing his PT exercises and is getting used to his new electric wheelchair.    Patient is accompanied by:  Family member    Pertinent History  Pt. is a 73 y.o. 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 Pt. PMHx includes: Multiple Falls, Lupus, DJD, Renal Abscess, CVA. Pt. resides with his wife. Pt.'s wife and daughter assist with ADLs. Pt. has caregivers in  for 2 hours a day, 6 days a week. Pt. received Rehab services in acute care, at SNF for STR, and [place] services. Pt. is retired from [employer] for [employer] and [employer].    Patient Stated Goals  To regain use of his left UE, and do more for himself.    Currently in Pain?  No/denies      Therapeutic activity: Joint mobility to left hand while sitting in electric wheelchair to help increase curvature of hand since edema is improved.  Educated patient's daughter on  how to provide this at home as well.  Patient reported that it felt good to have more movement in his L hand again.  Neuromuscular re-ed:  Worked on combining neuromuscular re-training during visual perceptual task to follow visual diagram that is placed on his left side with min verbal cue to look to the left and mod cues to problem solve length of PVC pipe tubing and how to adjust.  He needed min cues to use L hand with R with improved grasp and release and 3 point pinch but unable to supinate L wrist and forearm and compensated for this with his R hand.  Rec practicing wringing out a wash cloth, wiping down tables and cleaning windows with L hand to keep improving this motion.  Patient did well throughout session and his daughter asked good questions and appeared motivated to help him as much as he needs to promote use of his L hand and increase independence in  ADLs.                     OT Education - 09/12/18 1356    Education Details  visual perceptual activities and strengthening exercises for L hand    Person(s) Educated  Patient;Child(ren)    Methods  Explanation;Demonstration    Comprehension  Verbalized understanding;Returned demonstration;Verbal cues required          OT Long Term Goals - 09/04/18 1425      OT LONG TERM GOAL #1   Title  Pt. will increase left shoulder flexion AROM by 10 degrees to access cabinet/shelf.    Baseline  AROM left shoulder flexion: 108    Time  12    Period  Weeks    Status  On-going    Target Date  11/27/18      OT LONG TERM GOAL #2   Title  Pt. will donn a shirt with Supervision.    Baseline  Min-ModA    Time  12    Period  Weeks    Status  On-going    Target Date  11/27/18      OT LONG TERM GOAL #3   Title  Pt. will require ModA to perform LE dressing    Baseline  MaxA left, ModA with the right    Time  12    Period  Weeks    Status  On-going    Target Date  11/27/18      OT LONG TERM GOAL #4   Title  Pt. will improve left grip strength by 10# to be able to open a jar/container.    Baseline  Left: 30, Right 40    Time  12    Period  Weeks    Status  On-going    Target Date  11/27/18      OT LONG TERM GOAL #5   Title  Pt. will improve left hand [facility] skills to be able to assit with buttoning/zipping.    Baseline  Left: 1 min & 50 sec. R: 56 sec.    Time  12    Period  Weeks    Status  New    Target Date  11/27/18      OT LONG TERM GOAL #6   Title  Pt. will demonstrate visual conmpensatory strategies during 100% of the time during ADLs    Baseline  Pt. requires min-mod for left side awareness for tabletop tasks in his near space, Max cues in the extrapersonal space  Time  12    Period  Weeks    Status  On-going    Target Date  11/27/18      OT LONG TERM GOAL #7   Title  Pt. will prepare a simple cold snack from w/c with supervision using cognitive  compensatory strategies 100% of the time.    Baseline  Pt. is unable    Time  12    Period  Weeks    Status  On-going    Target Date  11/27/18      OT LONG TERM GOAL #8   Title  Pt. will accurately identify potential safety hazard using good safety awareness, and judgement 100% for ADLs, and IADLs.    Baseline  limited    Time  12    Period  Weeks    Status  On-going    Target Date  11/27/18      OT LONG TERM GOAL  #9   TITLE  Pt. will  navigate his w/c around obstacles with Supervision and 100% accuracy    Baseline  Pt. requires maxA, and heavy cuing for obstacle on the left    Time  12    Period  Weeks    Status  New    Target Date  11/27/18            Plan - 09/12/18 1357    Clinical Impression Statement  Patient continues to need verbal cues for attention and problem solving how to manuever electric wheelchair and his daughter did well with cueing him but expressed frustration with it.  Worked on how to cue him by standing in front of him vs at his side and on visual perceptual skills and bilateral hand use using PVC pipe tubing pieces while looking at visual diagram placed on his left side.  He continues to have improved edema control in LUE and hand and making good progress to increase functional use of LUE and hand to increase and maximize independence in ADLs and IADLSs.    OT Occupational Profile and History  Problem Focused Assessment - Including review of records relating to presenting problem    Occupational performance deficits (Please refer to evaluation for details):  ADL's    Body Structure / Function / Physical Skills  UE functional use;Coordination;FMC;Dexterity;Strength;ROM    Cognitive Skills  Attention;Memory;Emotional;Problem Solve;Safety Awareness    Rehab Potential  Good    Clinical Decision Making  Several treatment options, min-mod task modification necessary    Comorbidities Affecting Occupational Performance:  Presence of comorbidities impacting  occupational performance    Comorbidities impacting occupational performance description:  Phyical, cognitive, visual,  medical comorbidities    Modification or Assistance to Complete Evaluation   Min-Moderate modification of tasks or assist with assess necessary to complete eval    OT Frequency  3x / week    OT Duration  12 weeks    OT Treatment/Interventions  Self-care/ADL training;DME and/or AE instruction;Therapeutic exercise;Therapeutic activities;Moist Heat;Cognitive remediation/compensation;Neuromuscular education;Visual/perceptual remediation/compensation;Coping strategies training;Patient/family education;Passive range of motion;Psychosocial skills training;Energy conservation;Functional Mobility Training    Consulted and Agree with Plan of Care  Patient    Family Member Consulted  Daughter [name]       Patient will benefit from skilled therapeutic intervention in order to improve the following deficits and impairments:   Body Structure / Function / Physical Skills: UE functional use, Coordination, FMC, Dexterity, Strength, ROM Cognitive Skills: Attention, Memory, Emotional, Problem Solve, Safety Awareness     Visit Diagnosis: 1. Other  lack of coordination   2. Muscle weakness (generalized)   3. Vision impairment       Problem List There are no active problems to display for this patient.   Chrys Racer, OTR/L, [facility] ascom [phone] 09/12/18, 2:08 PM  [place] MAIN [facility] SERVICES [address] [place], [place], [address] Phone: [phone]   Fax:  [phone]  Name: Kenneth Summers MRN: [id number] Date of Birth: 1945-09-27

## 2018-09-12 NOTE — Therapy (Signed)
[place] MAIN [facility] SERVICES [address] [place], [place], [address] Phone: [phone]   Fax:  [phone]  Occupational Therapy Treatment  Patient Details  Name: Kenneth Summers MRN: [id number] Date of Birth: May 12, 1945 No data recorded  Encounter Date: 09/11/2018  OT End of Session - 09/12/18 0926    Visit Number  34    Number of Visits  48    Date for OT Re-Evaluation  11/27/18    Authorization Type  Progress report period starting 06/12/2018    OT Start Time  1300    OT Stop Time  1345    OT Time Calculation (min)  45 min    Activity Tolerance  Patient tolerated treatment well    Behavior During Therapy  [facility] for tasks assessed/performed       Past Medical History:  Diagnosis Date  . Alcohol abuse   . APS (antiphospholipid syndrome) ([place])   . BPH (benign prostatic hyperplasia)   . CAD (coronary artery disease)   . Cellulitis   . Cervical spinal stenosis   . Chronic osteomyelitis ([place])   . CKD (chronic kidney disease)   . CKD (chronic kidney disease)   . Clostridium difficile diarrhea   . Depression   . Diplopia   . Fatigue   . GERD (gastroesophageal reflux disease)   . Hyperlipemia   . Hypertension   . Hypovitaminosis D   . IBS (irritable bowel syndrome)   . 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 Insomnia   . Left lumbosacral radiculopathy   . Moderate obstructive sleep apnea   . Osteomyelitis of foot ([place])   . PAD (peripheral artery disease) ([place])   . Pruritus   . RA (rheumatoid arthritis) ([place])   . Radiculopathy   . Restless legs syndrome   . RLS (restless legs syndrome)   . SLE (systemic lupus erythematosus related syndrome) ([place])   . Stroke ([place])   . Toxic maculopathy of both eyes     Past Surgical History:  Procedure Laterality Date  . CARDIAC CATHETERIZATION    . CAROTID ENDARTERECTOMY    . CERVICAL LAMINECTOMY    . CORONARY ANGIOPLASTY    . FRACTURE SURGERY    . fusion C5-6-7    . HERNIA REPAIR    . KNEE  ARTHROSCOPY    . TONSILLECTOMY      There were no vitals filed for this visit.  Subjective Assessment - 09/11/18 1310    Subjective   Patient reports he had an "accident' with his wheelchair this week and ran into the brick side of the house, hurt his right hand and has an abrasion.  Patient reports its difficult to control the wc with his right hand, daughter reports they are getting another piece for the wc to make it easier.  "we thought this wheelchair would be the answer to all our problems."    Pertinent History  Pt. is a 73 y.o. 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 Pt. PMHx includes: Multiple Falls, Lupus, DJD, Renal Abscess, CVA. Pt. resides with his wife. Pt.'s wife and daughter assist with ADLs. Pt. has caregivers in  for 2 hours a day, 6 days a week. Pt. received Rehab services in acute care, at SNF for STR, and [facility] services. Pt. is retired from [employer] for [employer] and [employer].    Patient Stated Goals  To regain use of his left UE, and do more for himself.  Currently in Pain?  Yes    Pain Score  9     Pain Location  Ankle    Pain Orientation  Right    Pain Descriptors / Indicators  Aching;Shooting    Pain Onset  Today    Pain Frequency  Intermittent    Multiple Pain Sites  No       Neuromuscular re education  Patient seen this date with focus on that motor coordination skills on the right side to you operate his wheelchair. Normally he works on left-sided coordination however he is having increased difficulty with operating wheelchair and has difficulty with coordinating movements and speed of response. Utilized grooved pegboard to place and remove into grid with minimal cues for returning and manipulating objects.    Therapeutic exercise Patient seen for red there a band resistance exercises for shoulder flexion, abduction, diagonal patterns, elbow flexion extension for two sets of 12 repetitions each. Cues for technique  and form. Additional reaching tasks in multi directions with therapist providing demonstration and cues  Visual scanning and left-sided attention with cards in a scatter pile. Patient instructed to pick up cards in ascending order for each suit. Patient requires occasional key to locate select cards.  Response to treatment Patient reports incident with his wheelchair this week where he had difficulty coordinating movements to manage the controls on the right side. This resulted in him running into the brick wall of his house and hurting his foot.  patient appears to have difficulty with managing controls on the right and has a slower response and processing time. Daughter reports they are working with the wheelchair vendor to make some adjustments to accommodate for this. Patient continues to progress in other areas with vision strength and range of motion. Continue to work towards goals and plan of care to Maximize safety and independence and daily tasks.                      OT Education - 09/12/18 0925    Education Details  visual activities with card and scanning    Person(s) Educated  Patient;Child(ren)    Methods  Explanation;Demonstration    Comprehension  Verbalized understanding;Returned demonstration          OT Long Term Goals - 09/04/18 1425      OT LONG TERM GOAL #1   Title  Pt. will increase left shoulder flexion AROM by 10 degrees to access cabinet/shelf.    Baseline  AROM left shoulder flexion: 108    Time  12    Period  Weeks    Status  On-going    Target Date  11/27/18      OT LONG TERM GOAL #2   Title  Pt. will donn a shirt with Supervision.    Baseline  Min-ModA    Time  12    Period  Weeks    Status  On-going    Target Date  11/27/18      OT LONG TERM GOAL #3   Title  Pt. will require ModA to perform LE dressing    Baseline  MaxA left, ModA with the right    Time  12    Period  Weeks    Status  On-going    Target Date  11/27/18       OT LONG TERM GOAL #4   Title  Pt. will improve left grip strength by 10# to be able to open a jar/container.  Baseline  Left: 30, Right 40    Time  12    Period  Weeks    Status  On-going    Target Date  11/27/18      OT LONG TERM GOAL #5   Title  Pt. will improve left hand [facility] skills to be able to assit with buttoning/zipping.    Baseline  Left: 1 min & 50 sec. R: 56 sec.    Time  12    Period  Weeks    Status  New    Target Date  11/27/18      OT LONG TERM GOAL #6   Title  Pt. will demonstrate visual conmpensatory strategies during 100% of the time during ADLs    Baseline  Pt. requires min-mod for left side awareness for tabletop tasks in his near space, Max cues in the extrapersonal space    Time  12    Period  Weeks    Status  On-going    Target Date  11/27/18      OT LONG TERM GOAL #7   Title  Pt. will prepare a simple cold snack from w/c with supervision using cognitive compensatory strategies 100% of the time.    Baseline  Pt. is unable    Time  12    Period  Weeks    Status  On-going    Target Date  11/27/18      OT LONG TERM GOAL #8   Title  Pt. will accurately identify potential safety hazard using good safety awareness, and judgement 100% for ADLs, and IADLs.    Baseline  limited    Time  12    Period  Weeks    Status  On-going    Target Date  11/27/18      OT LONG TERM GOAL  #9   TITLE  Pt. will  navigate his w/c around obstacles with Supervision and 100% accuracy    Baseline  Pt. requires maxA, and heavy cuing for obstacle on the left    Time  12    Period  Weeks    Status  New    Target Date  11/27/18            Plan - 09/12/18 0926    OT Occupational Profile and History  Problem Focused Assessment - Including review of records relating to presenting problem    Occupational performance deficits (Please refer to evaluation for details):  ADL's    Body Structure / Function / Physical Skills  UE functional  use;Coordination;FMC;Dexterity;Strength;ROM    Cognitive Skills  Attention;Memory;Emotional;Problem Solve;Safety Awareness    Rehab Potential  Good    Clinical Decision Making  Several treatment options, min-mod task modification necessary    Comorbidities impacting occupational performance description:  Phyical, cognitive, visual,  medical comorbidities    Modification or Assistance to Complete Evaluation   Min-Moderate modification of tasks or assist with assess necessary to complete eval    OT Frequency  3x / week    OT Duration  12 weeks    OT Treatment/Interventions  Self-care/ADL training;DME and/or AE instruction;Therapeutic exercise;Therapeutic activities;Moist Heat;Cognitive remediation/compensation;Neuromuscular education;Visual/perceptual remediation/compensation;Coping strategies training;Patient/family education;Passive range of motion;Psychosocial skills training;Energy conservation;Functional Mobility Training    Consulted and Agree with Plan of Care  Patient    Family Member Consulted  Daughter [name]       Patient will benefit from skilled therapeutic intervention in order to improve the following deficits and impairments:   Body Structure / Function /  Physical Skills: UE functional use, Coordination, FMC, Dexterity, Strength, ROM Cognitive Skills: Attention, Memory, Emotional, Problem Solve, Safety Awareness     Visit Diagnosis: 1. Other lack of coordination   2. Vision impairment       Problem List There are no active problems to display for this patient.  Achilles Dunk, OTR/L, CLT  Nadia Torr 09/12/2018, 9:28 AM  [place] MAIN [facility] SERVICES [address] [place], [place], [address] Phone: [phone]   Fax:  [phone]  Name: Kenneth Summers MRN: [id number] Date of Birth: 1945/11/24

## 2018-09-16 ENCOUNTER — Ambulatory Visit: Payer: Medicare Other

## 2018-09-16 ENCOUNTER — Ambulatory Visit: Payer: Medicare Other | Admitting: Occupational Therapy

## 2018-09-16 ENCOUNTER — Other Ambulatory Visit: Payer: Self-pay

## 2018-09-16 ENCOUNTER — Encounter: Payer: Self-pay | Admitting: Occupational Therapy

## 2018-09-16 DIAGNOSIS — R262 Difficulty in walking, not elsewhere classified: Secondary | ICD-10-CM

## 2018-09-16 DIAGNOSIS — R278 Other lack of coordination: Secondary | ICD-10-CM

## 2018-09-16 DIAGNOSIS — M6281 Muscle weakness (generalized): Secondary | ICD-10-CM

## 2018-09-16 NOTE — Therapy (Signed)
[place] MAIN [facility] SERVICES [address] [place], [place], [address] Phone: [phone]   Fax:  [phone]  Occupational Therapy Treatment  Patient Details  Name: Kenneth Summers MRN: [id number] Date of Birth: 02/09/1945 No data recorded  Encounter Date: 09/16/2018  OT End of Session - 09/16/18 1540    Visit Number  36    Number of Visits  48    Date for OT Re-Evaluation  11/27/18    OT Start Time  1300    OT Stop Time  1345    OT Time Calculation (min)  45 min    Activity Tolerance  Patient tolerated treatment well    Behavior During Therapy  [facility] for tasks assessed/performed       Past Medical History:  Diagnosis Date  . Alcohol abuse   . APS (antiphospholipid syndrome) ([place])   . BPH (benign prostatic hyperplasia)   . CAD (coronary artery disease)   . Cellulitis   . Cervical spinal stenosis   . Chronic osteomyelitis ([place])   . CKD (chronic kidney disease)   . CKD (chronic kidney disease)   . Clostridium difficile diarrhea   . Depression   . Diplopia   . Fatigue   . GERD (gastroesophageal reflux disease)   . Hyperlipemia   . Hypertension   . Hypovitaminosis D   . IBS (irritable bowel syndrome)   . 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 Insomnia   . Left lumbosacral radiculopathy   . Moderate obstructive sleep apnea   . Osteomyelitis of foot ([place])   . PAD (peripheral artery disease) ([place])   . Pruritus   . RA (rheumatoid arthritis) ([place])   . Radiculopathy   . Restless legs syndrome   . RLS (restless legs syndrome)   . SLE (systemic lupus erythematosus related syndrome) ([place])   . Stroke ([place])   . Toxic maculopathy of both eyes     Past Surgical History:  Procedure Laterality Date  . CARDIAC CATHETERIZATION    . CAROTID ENDARTERECTOMY    . CERVICAL LAMINECTOMY    . CORONARY ANGIOPLASTY    . FRACTURE SURGERY    . fusion C5-6-7    . HERNIA REPAIR    . KNEE ARTHROSCOPY    . TONSILLECTOMY      There were no vitals filed  for this visit.   OT TREATMENT    Neuro muscular re-education:  Pt. worked on using his left hand for grasping 2" pegs from a container placed at his left side. Pt. grasped the pegs, and placed them onto the pegboard following large design patterns positioned to the right. Pt. required extensive cues to use his right hand. Pt. worked on reaching, and flexing his trunk forward to place the pegs into a container in from front of him, as well as encouraging trunk rotation to cross midline with the left to place items in the container. Pt. continues to work on improving ADL and IADL functioning.                       OT Education - 09/16/18 1540    Education Details  visual perceptual activities and strengthening exercises for L hand    Person(s) Educated  Patient    Methods  Explanation;Demonstration    Comprehension  Verbalized understanding;Returned demonstration;Verbal cues required          OT Long Term Goals - 09/04/18 1425      OT LONG TERM GOAL #  1   Title  Pt. will increase left shoulder flexion AROM by 10 degrees to access cabinet/shelf.    Baseline  AROM left shoulder flexion: 108    Time  12    Period  Weeks    Status  On-going    Target Date  11/27/18      OT LONG TERM GOAL #2   Title  Pt. will donn a shirt with Supervision.    Baseline  Min-ModA    Time  12    Period  Weeks    Status  On-going    Target Date  11/27/18      OT LONG TERM GOAL #3   Title  Pt. will require ModA to perform LE dressing    Baseline  MaxA left, ModA with the right    Time  12    Period  Weeks    Status  On-going    Target Date  11/27/18      OT LONG TERM GOAL #4   Title  Pt. will improve left grip strength by 10# to be able to open a jar/container.    Baseline  Left: 30, Right 40    Time  12    Period  Weeks    Status  On-going    Target Date  11/27/18      OT LONG TERM GOAL #5   Title  Pt. will improve left hand [facility] skills to be able to assit with  buttoning/zipping.    Baseline  Left: 1 min & 50 sec. R: 56 sec.    Time  12    Period  Weeks    Status  New    Target Date  11/27/18      OT LONG TERM GOAL #6   Title  Pt. will demonstrate visual conmpensatory strategies during 100% of the time during ADLs    Baseline  Pt. requires min-mod for left side awareness for tabletop tasks in his near space, Max cues in the extrapersonal space    Time  12    Period  Weeks    Status  On-going    Target Date  11/27/18      OT LONG TERM GOAL #7   Title  Pt. will prepare a simple cold snack from w/c with supervision using cognitive compensatory strategies 100% of the time.    Baseline  Pt. is unable    Time  12    Period  Weeks    Status  On-going    Target Date  11/27/18      OT LONG TERM GOAL #8   Title  Pt. will accurately identify potential safety hazard using good safety awareness, and judgement 100% for ADLs, and IADLs.    Baseline  limited    Time  12    Period  Weeks    Status  On-going    Target Date  11/27/18      OT LONG TERM GOAL  #9   TITLE  Pt. will  navigate his w/c around obstacles with Supervision and 100% accuracy    Baseline  Pt. requires maxA, and heavy cuing for obstacle on the left    Time  12    Period  Weeks    Status  New    Target Date  11/27/18            Plan - 09/16/18 1541    Clinical Impression Statement Pt. required cues for left sided awareness when controlling, and  maneuvering the electric w/c to the gym, and to the OT section. Pt. did however require less cuing. Pt. continues to require extensive cues to attempt to use his left hand during the session, and during daily tasks at home.   OT Occupational Profile and History  Problem Focused Assessment - Including review of records relating to presenting problem    Occupational performance deficits (Please refer to evaluation for details):  ADL's    Body Structure / Function / Physical Skills  UE functional  use;Coordination;FMC;Dexterity;Strength;ROM    Cognitive Skills  Attention;Memory;Emotional;Problem Solve;Safety Awareness    Rehab Potential  Good    Clinical Decision Making  Several treatment options, min-mod task modification necessary    Comorbidities Affecting Occupational Performance:  Presence of comorbidities impacting occupational performance    Comorbidities impacting occupational performance description:  Phyical, cognitive, visual,  medical comorbidities    Modification or Assistance to Complete Evaluation   Min-Moderate modification of tasks or assist with assess necessary to complete eval    OT Frequency  3x / week    OT Duration  12 weeks    OT Treatment/Interventions  Self-care/ADL training;DME and/or AE instruction;Therapeutic exercise;Therapeutic activities;Moist Heat;Cognitive remediation/compensation;Neuromuscular education;Visual/perceptual remediation/compensation;Coping strategies training;Patient/family education;Passive range of motion;Psychosocial skills training;Energy conservation;Functional Mobility Training    Consulted and Agree with Plan of Care  Patient    Family Member Consulted  Daughter [name]       Patient will benefit from skilled therapeutic intervention in order to improve the following deficits and impairments:   Body Structure / Function / Physical Skills: UE functional use, Coordination, FMC, Dexterity, Strength, ROM Cognitive Skills: Attention, Memory, Emotional, Problem Solve, Safety Awareness     Visit Diagnosis: 1. Muscle weakness (generalized)   2. Other lack of coordination       Problem List There are no active problems to display for this patient.   Harrel Carina, MS, OTR/L 09/16/2018, 3:59 PM  [place] MAIN [facility] SERVICES [address] [place], [place], [address] Phone: [phone]   Fax:  [phone]  Name: Kenneth Summers MRN: [id number] Date of Birth: 10-Jun-1945

## 2018-09-16 NOTE — Therapy (Signed)
[place] MAIN [facility] SERVICES [address] [place], [place], [address] Phone: [phone]   Fax:  [phone]  Physical Therapy Treatment  Patient Details  Name: Kenneth Summers MRN: [id number] Date of Birth: [birth year] Referring Provider (PT): Dr. [name], Guy Begin   Encounter Date: 09/16/2018  PT End of Session - 09/16/18 1355    Visit Number  38    Number of Visits  43    Date for PT Re-Evaluation  12/04/18    Authorization Type  Medicare reporting period starting 09/11/18    Authorization Time Period  Current cert period 08/09/1285-86/76/72    PT Start Time  1348    PT Stop Time  1430    PT Time Calculation (min)  42 min    Equipment Utilized During Treatment  Gait belt    Activity Tolerance  Patient limited by fatigue;Patient limited by pain    Behavior During Therapy  [facility] for tasks assessed/performed       Past Medical History:  Diagnosis Date  . Alcohol abuse   . APS (antiphospholipid syndrome) ([place])   . BPH (benign prostatic hyperplasia)   . CAD (coronary artery disease)   . Cellulitis   . Cervical spinal stenosis   . Chronic osteomyelitis ([place])   . CKD (chronic kidney disease)   . CKD (chronic kidney disease)   . Clostridium difficile diarrhea   . Depression   . Diplopia   . Fatigue   . GERD (gastroesophageal reflux disease)   . Hyperlipemia   . Hypertension   . Hypovitaminosis D   . IBS (irritable bowel syndrome)   . 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 Insomnia   . Left lumbosacral radiculopathy   . Moderate obstructive sleep apnea   . Osteomyelitis of foot ([place])   . PAD (peripheral artery disease) ([place])   . Pruritus   . RA (rheumatoid arthritis) ([place])   . Radiculopathy   . Restless legs syndrome   . RLS (restless legs syndrome)   . SLE (systemic lupus erythematosus related syndrome) ([place])   . Stroke ([place])   . Toxic maculopathy of both eyes     Past Surgical History:  Procedure Laterality Date  . CARDIAC  CATHETERIZATION    . CAROTID ENDARTERECTOMY    . CERVICAL LAMINECTOMY    . CORONARY ANGIOPLASTY    . FRACTURE SURGERY    . fusion C5-6-7    . HERNIA REPAIR    . KNEE ARTHROSCOPY    . TONSILLECTOMY      There were no vitals filed for this visit.  Subjective Assessment - 09/16/18 1250    Subjective  Pt reports feeling good today. He completed HEP exercises x1 since last PT session. His R ankle is mildly swollen and the purple blister reformed after previously popping over the medial malleolus. He continues to have increasing redness around the wound without warmth and mild bruising surrounding the R ankle. Pt's daughter reports discharge from the blister popping was clear but blister has reformed since.    Patient is accompained by:  Family member    Pertinent History  Patient is a 73 y.o. male who presents to outpatient physical therapy with a referral for medical diagnosis of CVA. This patient's chief complaints consist of left hemiplegia and overall deconditioning leading to the following functional deficits: dependent for ADLs, IADLs, unable to transfer to car, dependent transfers at home with hoyer lift, unable to walk or stand without significant assistance, difficulty with W/C  navigation..    Limitations  Sitting;Lifting;Standing;House hold activities;Writing;Walking;Other (comment)    Diagnostic tests  Brain MRI 11/01/2017: IMPRESSION: 1. Acute right ACA territory infarct as demonstrated on prior CT imaging. No intracranial hemorrhage or significant mass effect. 2. 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    Patient Stated Goals  wants to be able to walk again    Currently in Pain?  No/denies        TREATMENT   Neuromuscular Re-education    Standing balance in RW mod-maxA+2 and +1 at walker: 1 minute 20s x 2; noticeable fatigue vitals  SPO2 = 99%, HR=91bpm; max verbal and tactile cueing for anterior pelvic weight shift, upright posture and weight  shifting   Dynamic sitting balance with patient reaching for a variety of targets as determined by therapist. Alt UE  and focused especially on challenging lateral and forward reach. Pt also encouraged to reach across midline and improve trunk rotation.  Min to [facility] provided throughout for L lateral lean and posterior weight shift.      Instructed patient in squat pivot transfer wheelchair<>mat table with modA+2 with patient requiring max VCs for hand placement and positioning; Patient able to come up to partial stand, but has significant difficulty with pivot requiring increased assistance;  Transferred mat table to wheelchair with modA+2 exhibiting better mobility with better positioning;   Patient required modA+2 for scooting back in chair and obtaining optimal position in chair;      Therapeutic Exercise Examined R ankle wound. Unable to visualize wound bed due to dark fluid filled blister over wound. Mild redness around edges of wound but not bright red and does not appear to have expanded since last session. No significant swelling noted and no warmth. Mildly tender around wound. Bruise has continued to expand and change colors. Daughter denies any chills, fevers, night sweats, or colored discharge from wound. Redressed wound with 2x2 gauze and Coban wrap. Daughter advised to monitor wound and provided signs/symptoms of infection. Daughter advised to contact PCP if she develops any concerns.  Seated Marches x10 bilaterally, extensive cues and redirection required; Seated LAQ x10 bilaterally; required assistance with LLE to initiate and complete exercise.     Pt educated throughout session about proper posture and technique with exercises. Improved exercise technique, movement at target joints, use of target muscles after min to mod verbal, visual, tactile cues.    Pt continues to display good motivation throughout PT sessions. Pt demonstrates improvement in w/c <> mat transfers today  requiring less verbal cueing for hand placement. He is able to scoot forward in chair well, but often reverts to leaning posteriorly especially during mid transfer requiring assistance for forward weight shift, however demonstrated improved forward trunk lean. Pt requires mod-max verbal and tactile cueing for upright posture and weight shifting in sitting. Utilized Geologist, engineering for visual cues for erect posture and weight shift in standing which pt responds to well. He demonstrates improvement in recall of steps to perform w/c <> mat transfers.  He has difficulty with dual task component of maintaining an upright posture during dynamic reaching tending to laterally lean to the leg and posteriorly weight shift. Examined R ankle wound during session. Unable to visualize wound bed due to dark fluid filled blister over wound. Mild redness around edges of wound but not bright red and does not appear to have expanded since last session. No significant swelling noted and no warmth. Mildly tender around wound. Bruise has continued to expand  and change colors. Daughter denies any chills, fevers, night sweats, or colored discharge from wound. Redressed wound with 2x2 gauze and Coban wrap. Daughter advised to monitor wound and provided signs/symptoms of infection. Daughter advised to contact PCP if she develops any concerns. Pt will benefit from continued skilled PT services in order to increase strength and functional mobility in order to decrease caregiver burden, improve his ability to perform ADLs, and to improve his QOL.         PT Education - 09/16/18 1251    Education Details  transfers/positioning, standing balance, strengthening;    Person(s) Educated  Patient;Child(ren)    Methods  Explanation;Demonstration;Tactile cues;Verbal cues    Comprehension  Verbalized understanding;Returned demonstration;Verbal cues required;Tactile cues required       PT Short Term Goals - 09/11/18 1704      PT SHORT TERM GOAL #1    Title  Be independent with initial home exercise program for self-management of symptoms.    Baseline  HEP to be given at second session; advice to practice unsupported sitting at home (06/19/2018); 07/08/18: Performing at home, 6/29: needs assistance but is doing exercise program regularly; 08/22/18: adherent;    Time  2    Period  Weeks    Status  Achieved    Target Date  06/26/18        PT Long Term Goals - 09/11/18 1704      PT LONG TERM GOAL #1   Title  Patient will complete all bed mobility with min A to improve functional independence for getting in and out of bed and adjusting in bed.     Baseline  max A - total A reported by family (06/19/2018); 07/08/18: still requires maxA, dtr reports improvement in rolling since starting therapy; 6/23: min A to supervision in clinic; not doing at home right; 8/5: Pt's daughter states that his rolling has improved, but still has difficulty with all other bed mobility    Time  12    Period  Weeks    Status  Partially Met    Target Date  12/04/18      PT LONG TERM GOAL #2   Title  Patient will complete sit <> stand transfer chair to chair with Min A and LRAD to improve functional independence for household and community mobility.     Baseline  max A +2 STS at W/C (06/12/2018); max A +2-3 STS or stand pivot transfer, able to complete more reps (06/19/2018); 07/08/18: not safe to attempt at this time; 09/11/18: requires maxA+1 or modA+2 for STSs and squat pivot transfer    Time  12    Period  Weeks    Status  On-going    Target Date  12/04/18      PT LONG TERM GOAL #3   Title  Patient will navigate manual w/c with min A x 100 feet to improve mobility for household and short community distances.     Baseline  required total A (06/12/2018); able to roll 20 feet with min A (06/20/2018); 07/08/18: Pt can go approximately 10' without assist per family;    Time  12    Period  Weeks    Status  On-going    Target Date  12/04/18      PT LONG TERM GOAL #4   Title   Patient will complete car transfers W/C to family SUV with min A using LRAD to improve his abilty to participate in community activities.     Baseline  unable to complete car transfers at all (06/12/2018); unable to complete car transfers at all (06/19/2018).; 07/08/18: Unable to perform at this time; 09/11/18: unable to perform at this time    Time  12    Period  Weeks    Status  On-going    Target Date  12/04/18      PT LONG TERM GOAL #5   Title  Patient will ambulate at least 150 feet with mod I using LRAD to improve mobility for household and community distances.     Baseline  unable to take steps (06/12/2018); able to shift R leg forward and back with max A and RW at edge of plinth (06/19/2018); 07/08/18: unable to ambulate at this time. 09/11/18: unable to perform at this time    Time  12    Period  Weeks    Status  On-going    Target Date  12/04/18      Additional Long Term Goals   Additional Long Term Goals  Yes      PT LONG TERM GOAL #6   Title  Patient will navigate 4 steps with min A and BUE support to improve his ability to access community and social participation.     Baseline  unable to complet stairs (06/12/2018); unable to complete stairs (06/19/2018); 07/08/18: unable to attempt at this time; 09/11/18: unsafe to attempt at this time    Time  12    Period  Weeks    Status  On-going    Target Date  12/04/18      PT LONG TERM GOAL #7   Title  Patient will increase functional reach test to >15 inches in sitting to exhibit improved sitting balance and positioning and reduce fall risk;    Baseline  07/30/18: 12 inches; 09/11/18: 10-12 inches    Time  4    Period  Weeks    Status  Partially Met    Target Date  12/04/18            Plan - 09/16/18 1255    Clinical Impression Statement  Pt continues to display good motivation throughout PT sessions. Pt demonstrates improvement in w/c <> mat transfers today requiring less verbal cueing for hand placement. He is able to scoot forward in chair  well, but often reverts to leaning posteriorly especially during mid transfer requiring assistance for forward weight shift, however demonstrated improved forward trunk lean. Pt requires mod-max verbal and tactile cueing for upright posture and weight shifting in sitting. Utilized Geologist, engineering for visual cues for erect posture and weight shift in standing which pt responds to well. He demonstrates improvement in recall of steps to perform w/c <> mat transfers.  He has difficulty with dual task component of maintaining an upright posture during dynamic reaching tending to laterally lean to the leg and posteriorly weight shift. Examined R ankle wound during session. Unable to visualize wound bed due to dark fluid filled blister over wound. Mild redness around edges of wound but not bright red and does not appear to have expanded since last session. No significant swelling noted and no warmth. Mildly tender around wound. Bruise has continued to expand and change colors. Daughter denies any chills, fevers, night sweats, or colored discharge from wound. Redressed wound with 2x2 gauze and Coban wrap. Daughter advised to monitor wound and provided signs/symptoms of infection. Daughter advised to contact PCP if she develops any concerns. Pt will benefit from continued skilled PT services in order to increase strength  and functional mobility in order to decrease caregiver burden, improve his ability to perform ADLs, and to improve his QOL.    Personal Factors and Comorbidities  Age;Comorbidity 3+    Comorbidities  Relevant past medical history and comorbidities include long term steroid use for lupus, CKD, chronic osteomyelitis, cervical spine stenosis, BPH, APS, alcohol abuse, peripheral artery disease, depression, GERD, obstructive sleep apnea, HTN, IBS, IgA deficiency, lumbar radiculopathy, RA, Stroke, toxic maculopathy in both eyes, systemic lupus erythematosus related syndrome, cardiac catheterization, carotid  endarterectomy, cervical laminectomy, coronary angioplasty, Fusion C5-C7, knee arthroplasty, lumbar surgery.    Examination-Activity Limitations  Bed Mobility;Bend;Sit;Toileting;Stand;Stairs;Lift;Transfers;Squat;Locomotion Level;Carry;Dressing;Hygiene/Grooming;Continence    Examination-Participation Restrictions  Yard Work;Interpersonal Relationship;Community Activity    Stability/Clinical Decision Making  Evolving/Moderate complexity    Rehab Potential  Fair    PT Frequency  3x / week    PT Duration  12 weeks    PT Treatment/Interventions  ADLs/Self Care Home Management;Electrical Stimulation;Moist Heat;Cryotherapy;Gait training;Stair training;Functional mobility training;Therapeutic exercise;Balance training;Neuromuscular re-education;Cognitive remediation;Patient/family education;Orthotic Fit/Training;Wheelchair mobility training;Manual techniques;Passive range of motion;Energy conservation;Joint Manipulations    PT Next Visit Plan  Work on strength, sitting/standing balance, functional activities such as rolling, bed mobility, transfers    PT [place] tracking sheet provided via Beth today    Consulted and Agree with Plan of Care  Patient;Family member/caregiver    Family Member Consulted  daugher [name]       Patient will benefit from skilled therapeutic intervention in order to improve the following deficits and impairments:  Abnormal gait, Decreased activity tolerance, Decreased cognition, Decreased endurance, Decreased knowledge of use of DME, Decreased range of motion, Decreased skin integrity, Decreased strength, Impaired perceived functional ability, Impaired sensation, Impaired UE functional use, Improper body mechanics, Pain, Cardiopulmonary status limiting activity, Decreased balance, Decreased coordination, Decreased mobility, Difficulty walking, Impaired tone, Postural dysfunction  Visit Diagnosis: 1. Other lack of coordination   2. Muscle weakness (generalized)    3. Difficulty in walking, not elsewhere classified        Problem List There are no active problems to display for this patient.   This entire session was performed under direct supervision and direction of a licensed therapist/therapist assistant . I have personally read, edited and approve of the note as written.   Elmyra Ricks Tamaira Ciriello SPT Phillips Grout PT, DPT, GCS  Huprich,Jason 09/17/2018, 8:30 AM  [place] MAIN [facility] SERVICES [address] [place], [place], [address] Phone: [phone]   Fax:  [phone]  Name: TARENCE SEARCY MRN: [id number] Date of Birth: 03-03-1945

## 2018-09-18 ENCOUNTER — Encounter: Payer: Self-pay | Admitting: Occupational Therapy

## 2018-09-18 ENCOUNTER — Other Ambulatory Visit: Payer: Self-pay

## 2018-09-18 ENCOUNTER — Ambulatory Visit: Payer: Medicare Other | Admitting: Physical Therapy

## 2018-09-18 ENCOUNTER — Ambulatory Visit: Payer: Medicare Other

## 2018-09-18 ENCOUNTER — Ambulatory Visit: Payer: Medicare Other | Admitting: Occupational Therapy

## 2018-09-18 DIAGNOSIS — R278 Other lack of coordination: Secondary | ICD-10-CM | POA: Diagnosis not present

## 2018-09-18 DIAGNOSIS — M6281 Muscle weakness (generalized): Secondary | ICD-10-CM

## 2018-09-18 DIAGNOSIS — R262 Difficulty in walking, not elsewhere classified: Secondary | ICD-10-CM

## 2018-09-18 NOTE — Therapy (Signed)
[place] MAIN [facility] SERVICES [address] [place], [place], [address] Phone: [phone]   Fax:  [phone]  Physical Therapy Treatment  Patient Details  Name: Kenneth Summers MRN: [id number] Date of Birth: 1945/05/14 Referring Provider (PT): Dr. [name], Guy Begin   Encounter Date: 09/18/2018  PT End of Session - 09/18/18 1455    Visit Number  39    Number of Visits  14    Date for PT Re-Evaluation  12/04/18    Authorization Type  Medicare reporting period starting 09/11/18    Authorization Time Period  Current cert period 10/10/7652-65/03/54    PT Start Time  1345    PT Stop Time  1430    PT Time Calculation (min)  45 min    Equipment Utilized During Treatment  Gait belt    Activity Tolerance  Patient limited by fatigue;Patient limited by pain    Behavior During Therapy  [facility] for tasks assessed/performed       Past Medical History:  Diagnosis Date  . Alcohol abuse   . APS (antiphospholipid syndrome) ([place])   . BPH (benign prostatic hyperplasia)   . CAD (coronary artery disease)   . Cellulitis   . Cervical spinal stenosis   . Chronic osteomyelitis ([place])   . CKD (chronic kidney disease)   . CKD (chronic kidney disease)   . Clostridium difficile diarrhea   . Depression   . Diplopia   . Fatigue   . GERD (gastroesophageal reflux disease)   . Hyperlipemia   . Hypertension   . Hypovitaminosis D   . IBS (irritable bowel syndrome)   . 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 Insomnia   . Left lumbosacral radiculopathy   . Moderate obstructive sleep apnea   . Osteomyelitis of foot ([place])   . PAD (peripheral artery disease) ([place])   . Pruritus   . RA (rheumatoid arthritis) ([place])   . Radiculopathy   . Restless legs syndrome   . RLS (restless legs syndrome)   . SLE (systemic lupus erythematosus related syndrome) ([place])   . Stroke ([place])   . Toxic maculopathy of both eyes     Past Surgical History:  Procedure Laterality Date  . CARDIAC  CATHETERIZATION    . CAROTID ENDARTERECTOMY    . CERVICAL LAMINECTOMY    . CORONARY ANGIOPLASTY    . FRACTURE SURGERY    . fusion C5-6-7    . HERNIA REPAIR    . KNEE ARTHROSCOPY    . TONSILLECTOMY      There were no vitals filed for this visit.  Subjective Assessment - 09/18/18 1400    Subjective  Pt reports he is feeling good today. He had a telehealth appt yesterday, and MD agreed that the wound on the medial R ankle looks like it is healing well. His R ankle is less swollen today and the redness surrounding the injury is less red today.    Patient is accompained by:  Family member    Pertinent History  Patient is a 73 y.o. male who presents to outpatient physical therapy with a referral for medical diagnosis of CVA. This patient's chief complaints consist of left hemiplegia and overall deconditioning leading to the following functional deficits: dependent for ADLs, IADLs, unable to transfer to car, dependent transfers at home with hoyer lift, unable to walk or stand without significant assistance, difficulty with W/C navigation..    Limitations  Sitting;Lifting;Standing;House hold activities;Writing;Walking;Other (comment)    Diagnostic tests  Brain MRI  11/01/2017: IMPRESSION: 1. Acute right ACA territory infarct as demonstrated on prior CT imaging. No intracranial hemorrhage or significant mass effect. 2. 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    Patient Stated Goals  wants to be able to walk again    Currently in Pain?  No/denies       TREATMENT  There-ex   Hamstring stretch with 30 sec hold bilaterally    Piriformis stretch with 30 sec hold bilaterally    Figure 4 stretch with 30 sec hold  bilaterally    Bridging with knees on bolster x10 with a 3 second hold   SAQ in supine with improved ability to contract LLE x10 with a 5 second hold   L Hip abduction in supine with MinA under heel for guidance x10  Hip adduction in hooklying  x10  Hip Abduction in hooklying with manual resistance x10    Neuromuscular Re-education  Instructed patient in squat pivot transfer wheelchair<>mat table with modA+2 with patient requiring max VCs for hand placement and positioning; Patient able to come up to partial stand, but has significant difficulty with pivot requiring increased assistance   Bed mobility: rolling to L with cues to reach for PT's hand to facilitate rotating hips and to initiate a chin tuck x3 in each direction  Seated ball rolls outs with wedging on L side; 3 forward, 3 to L and 3 to R with cues to keep bilateral arms on ball to increase anterior weight shift  Standing balance in RW mod-maxA+2 and +1 at walker: x2; max verbal and tactile cueing for anterior pelvic weight shift, upright posture and weight shifting      Pt educated throughout session about proper posture and technique with exercises. Improved exercise technique, movement at target joints, use of target muscles after min to mod verbal, visual, tactile cues.        Pt continues to display good motivation throughout PT sessions. Pt continues to have difficulty with coordinating a STS from an elevated mat to a RW, verbalizing better understanding of hand placement and to pull his hips into extension, but still requiring cueing for anterior weight shift. A wedge was trialed under L hip in sitting, which improved his midline posture, with intermittent leaning to the R demonstrating an altered sense of midline before wedging.  Pt's daughter will add towels underneath WC cushion at home as a trial to see if his sense of midline can be corrected with prolonged wedge sitting.  He also demonstrates increased ability to perform rolling L and R on a mat table, but requires max cueing for reaching outside his BOS to initiate rolling from supine to sidelying.  Pt will benefit from continued skilled PT services in order to increase strength and functional mobility in order  to decrease caregiver burden, improve his ability to perform ADLs, and to improve his QOL.      PT Short Term Goals - 09/11/18 1704      PT SHORT TERM GOAL #1   Title  Be independent with initial home exercise program for self-management of symptoms.    Baseline  HEP to be given at second session; advice to practice unsupported sitting at home (06/19/2018); 07/08/18: Performing at home, 6/29: needs assistance but is doing exercise program regularly; 08/22/18: adherent;    Time  2    Period  Weeks    Status  Achieved    Target Date  06/26/18  PT Long Term Goals - 09/11/18 1704      PT LONG TERM GOAL #1   Title  Patient will complete all bed mobility with min A to improve functional independence for getting in and out of bed and adjusting in bed.     Baseline  max A - total A reported by family (06/19/2018); 07/08/18: still requires maxA, dtr reports improvement in rolling since starting therapy; 6/23: min A to supervision in clinic; not doing at home right; 8/5: Pt's daughter states that his rolling has improved, but still has difficulty with all other bed mobility    Time  12    Period  Weeks    Status  Partially Met    Target Date  12/04/18      PT LONG TERM GOAL #2   Title  Patient will complete sit <> stand transfer chair to chair with Min A and LRAD to improve functional independence for household and community mobility.     Baseline  max A +2 STS at W/C (06/12/2018); max A +2-3 STS or stand pivot transfer, able to complete more reps (06/19/2018); 07/08/18: not safe to attempt at this time; 09/11/18: requires maxA+1 or modA+2 for STSs and squat pivot transfer    Time  12    Period  Weeks    Status  On-going    Target Date  12/04/18      PT LONG TERM GOAL #3   Title  Patient will navigate manual w/c with min A x 100 feet to improve mobility for household and short community distances.     Baseline  required total A (06/12/2018); able to roll 20 feet with min A (06/20/2018); 07/08/18: Pt  can go approximately 10' without assist per family;    Time  12    Period  Weeks    Status  On-going    Target Date  12/04/18      PT LONG TERM GOAL #4   Title  Patient will complete car transfers W/C to family SUV with min A using LRAD to improve his abilty to participate in community activities.     Baseline  unable to complete car transfers at all (06/12/2018); unable to complete car transfers at all (06/19/2018).; 07/08/18: Unable to perform at this time; 09/11/18: unable to perform at this time    Time  12    Period  Weeks    Status  On-going    Target Date  12/04/18      PT LONG TERM GOAL #5   Title  Patient will ambulate at least 150 feet with mod I using LRAD to improve mobility for household and community distances.     Baseline  unable to take steps (06/12/2018); able to shift R leg forward and back with max A and RW at edge of plinth (06/19/2018); 07/08/18: unable to ambulate at this time. 09/11/18: unable to perform at this time    Time  12    Period  Weeks    Status  On-going    Target Date  12/04/18      Additional Long Term Goals   Additional Long Term Goals  Yes      PT LONG TERM GOAL #6   Title  Patient will navigate 4 steps with min A and BUE support to improve his ability to access community and social participation.     Baseline  unable to complet stairs (06/12/2018); unable to complete stairs (06/19/2018); 07/08/18: unable to attempt at this  time; 09/11/18: unsafe to attempt at this time    Time  12    Period  Weeks    Status  On-going    Target Date  12/04/18      PT LONG TERM GOAL #7   Title  Patient will increase functional reach test to >15 inches in sitting to exhibit improved sitting balance and positioning and reduce fall risk;    Baseline  07/30/18: 12 inches; 09/11/18: 10-12 inches    Time  4    Period  Weeks    Status  Partially Met    Target Date  12/04/18            Plan - 09/18/18 1453    Clinical Impression Statement  Pt continues to display good motivation  throughout PT sessions. Pt continues to have difficulty with coordinating a STS from an elevated mat to a RW, verbalizing better understanding of hand placement and to pull his hips into extension, but still requiring cueing for anterior weight shift. A wedge was trialed under L hip in sitting, which improved his midline posture, with intermittent leaning to the R demonstrating an altered sense of midline before wedging.  Pt's daughter will add towels underneath WC cushion at home as a trial to see if his sense of midline can be corrected with prolonged wedge sitting.  He also demonstrates increased ability to perform rolling L and R on a mat table, but requires max cueing for reaching outside his BOS to initiate rolling from supine to sidelying.  Pt will benefit from continued skilled PT services in order to increase strength and functional mobility in order to decrease caregiver burden, improve his ability to perform ADLs, and to improve his QOL.    Personal Factors and Comorbidities  Age;Comorbidity 3+    Comorbidities  Relevant past medical history and comorbidities include long term steroid use for lupus, CKD, chronic osteomyelitis, cervical spine stenosis, BPH, APS, alcohol abuse, peripheral artery disease, depression, GERD, obstructive sleep apnea, HTN, IBS, IgA deficiency, lumbar radiculopathy, RA, Stroke, toxic maculopathy in both eyes, systemic lupus erythematosus related syndrome, cardiac catheterization, carotid endarterectomy, cervical laminectomy, coronary angioplasty, Fusion C5-C7, knee arthroplasty, lumbar surgery.    Examination-Activity Limitations  Bed Mobility;Bend;Sit;Toileting;Stand;Stairs;Lift;Transfers;Squat;Locomotion Level;Carry;Dressing;Hygiene/Grooming;Continence    Examination-Participation Restrictions  Yard Work;Interpersonal Relationship;Community Activity    Stability/Clinical Decision Making  Evolving/Moderate complexity    Rehab Potential  Fair    PT Frequency  3x / week     PT Duration  12 weeks    PT Treatment/Interventions  ADLs/Self Care Home Management;Electrical Stimulation;Moist Heat;Cryotherapy;Gait training;Stair training;Functional mobility training;Therapeutic exercise;Balance training;Neuromuscular re-education;Cognitive remediation;Patient/family education;Orthotic Fit/Training;Wheelchair mobility training;Manual techniques;Passive range of motion;Energy conservation;Joint Manipulations    PT Next Visit Plan  Work on strength, sitting/standing balance, functional activities such as rolling, bed mobility, transfers    PT Home Exercise Plan  HEP handout provided via Beth last week    Consulted and Agree with Plan of Care  Patient;Family member/caregiver    Family Member Consulted  daugher [name]       Patient will benefit from skilled therapeutic intervention in order to improve the following deficits and impairments:  Abnormal gait, Decreased activity tolerance, Decreased cognition, Decreased endurance, Decreased knowledge of use of DME, Decreased range of motion, Decreased skin integrity, Decreased strength, Impaired perceived functional ability, Impaired sensation, Impaired UE functional use, Improper body mechanics, Pain, Cardiopulmonary status limiting activity, Decreased balance, Decreased coordination, Decreased mobility, Difficulty walking, Impaired tone, Postural dysfunction  Visit Diagnosis: 1. Other lack of  coordination   2. Muscle weakness (generalized)   3. Difficulty in walking, not elsewhere classified        Problem List There are no active problems to display for this patient.  This entire session was performed under direct supervision and direction of a licensed therapist/therapist assistant . I have personally read, edited and approve of the note as written.   Lutricia Horsfall, SPT Phillips Grout PT, DPT, GCS  Huprich,Jason 09/19/2018, 9:55 AM  [place] MAIN [facility] SERVICES [address]  [place], [place], [address] Phone: [phone]   Fax:  [phone]  Name: Kenneth Summers MRN: [id number] Date of Birth: 05-09-1945

## 2018-09-18 NOTE — Therapy (Signed)
[place] MAIN [facility] SERVICES [address] [place], [place], [address] Phone: [phone]   Fax:  [phone]  Occupational Therapy Treatment  Patient Details  Name: Kenneth Summers MRN: [id number] Date of Birth: 09-23-1945 No data recorded  Encounter Date: 09/18/2018  OT End of Session - 09/18/18 1723    Visit Number  37    Number of Visits  9    Date for OT Re-Evaluation  11/27/18    Authorization Type  Progress report period starting 06/12/2018    OT Start Time  1255    OT Stop Time  1345    OT Time Calculation (min)  50 min    Activity Tolerance  Patient tolerated treatment well    Behavior During Therapy  [facility] for tasks assessed/performed       Past Medical History:  Diagnosis Date  . Alcohol abuse   . APS (antiphospholipid syndrome) ([place])   . BPH (benign prostatic hyperplasia)   . CAD (coronary artery disease)   . Cellulitis   . Cervical spinal stenosis   . Chronic osteomyelitis ([place])   . CKD (chronic kidney disease)   . CKD (chronic kidney disease)   . Clostridium difficile diarrhea   . Depression   . Diplopia   . Fatigue   . GERD (gastroesophageal reflux disease)   . Hyperlipemia   . Hypertension   . Hypovitaminosis D   . IBS (irritable bowel syndrome)   . IgA deficiency ([place])   . Insomnia   . Left lumbosacral radiculopathy   . Moderate obstructive sleep apnea   . Osteomyelitis of foot ([place])   . PAD (peripheral artery disease) ([place])   . Pruritus   . RA (rheumatoid arthritis) ([place])   . Radiculopathy   . Restless legs syndrome   . RLS (restless legs syndrome)   . SLE (systemic lupus erythematosus related syndrome) ([place])   . Stroke ([place])   . Toxic maculopathy of both eyes     Past Surgical History:  Procedure Laterality Date  . CARDIAC CATHETERIZATION    . CAROTID ENDARTERECTOMY    . CERVICAL LAMINECTOMY    . CORONARY ANGIOPLASTY    . FRACTURE SURGERY    . fusion C5-6-7    . HERNIA REPAIR    . KNEE  ARTHROSCOPY    . TONSILLECTOMY      There were no vitals filed for this visit.  Subjective Assessment - 09/18/18 1720    Subjective   Pt. has made progress overall    Patient is accompanied by:  Family member    Pertinent History  Pt. is a 73 y.o. 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 Pt. PMHx includes: Multiple Falls, Lupus, DJD, Renal Abscess, CVA. Pt. resides with his wife. Pt.'s wife and daughter assist with ADLs. Pt. has caregivers in  for 2 hours a day, 6 days a week. Pt. received Rehab services in acute care, at SNF for STR, and [facility]. Pt. is retired from [employer] for [employer] and [employer].    Patient Stated Goals  To regain use of his left UE, and do more for himself.    Currently in Pain?  No/denies      OT TREATMENT    Neuro muscular re-education:  Pt. Worked on left hand [facility] skills grasping 1" ringed cubes, and placing them over a horizontal dowel. Pt. Initially worked on reaching up to the left to place the cubes  onto the dowel. Pt. Then worked on holding the dowel with his right hand at midline, and placing the cubes onto the dowel using bilateral hand coordination. Pt. performed resistive EZ Board exercises for left  forearm supination/pronation, wrist flexion/extension using gross grasp, and lateral pinch (key) grasp. Pt. performed resistive EZ Board exercises angled in several planes to promote shoulder flexion, abduction, and wrist flexion, and extension while performing resistive wrist flexion and extension with a gross grip. Pt. Was provided with dycem for stability during tabletop tasks. Pt. Required fewer cues to use his left hand during tasks today.                          OT Education - 09/18/18 1722    Education Details  LUE functioning, reaching    Person(s) Educated  Patient    Methods  Explanation;Demonstration    Comprehension  Verbalized understanding;Returned demonstration;Verbal  cues required          OT Long Term Goals - 09/04/18 1425      OT LONG TERM GOAL #1   Title  Pt. will increase left shoulder flexion AROM by 10 degrees to access cabinet/shelf.    Baseline  AROM left shoulder flexion: 108    Time  12    Period  Weeks    Status  On-going    Target Date  11/27/18      OT LONG TERM GOAL #2   Title  Pt. will donn a shirt with Supervision.    Baseline  Min-ModA    Time  12    Period  Weeks    Status  On-going    Target Date  11/27/18      OT LONG TERM GOAL #3   Title  Pt. will require ModA to perform LE dressing    Baseline  MaxA left, ModA with the right    Time  12    Period  Weeks    Status  On-going    Target Date  11/27/18      OT LONG TERM GOAL #4   Title  Pt. will improve left grip strength by 10# to be able to open a jar/container.    Baseline  Left: 30, Right 40    Time  12    Period  Weeks    Status  On-going    Target Date  11/27/18      OT LONG TERM GOAL #5   Title  Pt. will improve left hand [facility] skills to be able to assit with buttoning/zipping.    Baseline  Left: 1 min & 50 sec. R: 56 sec.    Time  12    Period  Weeks    Status  New    Target Date  11/27/18      OT LONG TERM GOAL #6   Title  Pt. will demonstrate visual conmpensatory strategies during 100% of the time during ADLs    Baseline  Pt. requires min-mod for left side awareness for tabletop tasks in his near space, Max cues in the extrapersonal space    Time  12    Period  Weeks    Status  On-going    Target Date  11/27/18      OT LONG TERM GOAL #7   Title  Pt. will prepare a simple cold snack from w/c with supervision using cognitive compensatory strategies 100% of the time.    Baseline  Pt. is unable  Time  12    Period  Weeks    Status  On-going    Target Date  11/27/18      OT LONG TERM GOAL #8   Title  Pt. will accurately identify potential safety hazard using good safety awareness, and judgement 100% for ADLs, and IADLs.    Baseline  limited     Time  12    Period  Weeks    Status  On-going    Target Date  11/27/18      OT LONG TERM GOAL  #9   TITLE  Pt. will  navigate his w/c around obstacles with Supervision and 100% accuracy    Baseline  Pt. requires maxA, and heavy cuing for obstacle on the left    Time  12    Period  Weeks    Status  New    Target Date  11/27/18            Plan - 09/18/18 1724    Clinical Impression Statement  Pt. continues to present with limited left sided awareness requiring increased cues to attend to the left side especially when using his electric w/c. Pt. required fewer cues today to initiate with his his LUE during tasks. Pt. was provided with dycem to stabilize tabletop items at home. Pt. was assisted with positioning in his w/c secondary to multiple complaints of discomfort from restless leg syndrome. Pt. Was provided with additional positioning assist using pillows at his left side, and back secondary to leaning laterally to the left.   OT Occupational Profile and History  Problem Focused Assessment - Including review of records relating to presenting problem    Occupational performance deficits (Please refer to evaluation for details):  ADL's    Body Structure / Function / Physical Skills  UE functional use;Coordination;FMC;Dexterity;Strength;ROM    Cognitive Skills  Attention;Memory;Emotional;Problem Solve;Safety Awareness    Rehab Potential  Good    Clinical Decision Making  Several treatment options, min-mod task modification necessary    Comorbidities Affecting Occupational Performance:  Presence of comorbidities impacting occupational performance    Comorbidities impacting occupational performance description:  Phyical, cognitive, visual,  medical comorbidities    Modification or Assistance to Complete Evaluation   Min-Moderate modification of tasks or assist with assess necessary to complete eval    OT Frequency  3x / week    OT Duration  12 weeks    OT Treatment/Interventions   Self-care/ADL training;DME and/or AE instruction;Therapeutic exercise;Therapeutic activities;Moist Heat;Cognitive remediation/compensation;Neuromuscular education;Visual/perceptual remediation/compensation;Coping strategies training;Patient/family education;Passive range of motion;Psychosocial skills training;Energy conservation;Functional Mobility Training    Consulted and Agree with Plan of Care  Patient    Family Member Consulted  Daughter [name]       Patient will benefit from skilled therapeutic intervention in order to improve the following deficits and impairments:   Body Structure / Function / Physical Skills: UE functional use, Coordination, FMC, Dexterity, Strength, ROM Cognitive Skills: Attention, Memory, Emotional, Problem Solve, Safety Awareness     Visit Diagnosis: 1. Muscle weakness (generalized)   2. Other lack of coordination       Problem List There are no active problems to display for this patient.   Harrel Carina, MS, OTR/L 09/18/2018, 5:32 PM  [place] MAIN [facility] SERVICES [address] [place], [place], [address] Phone: [phone]   Fax:  [phone]  Name: Kenneth Summers MRN: [id number] Date of Birth: 03-29-1945

## 2018-09-19 ENCOUNTER — Ambulatory Visit: Payer: Medicare Other

## 2018-09-19 ENCOUNTER — Encounter: Payer: Self-pay | Admitting: Occupational Therapy

## 2018-09-19 ENCOUNTER — Other Ambulatory Visit: Payer: Self-pay

## 2018-09-19 ENCOUNTER — Ambulatory Visit: Payer: Medicare Other | Admitting: Occupational Therapy

## 2018-09-19 DIAGNOSIS — R278 Other lack of coordination: Secondary | ICD-10-CM

## 2018-09-19 DIAGNOSIS — M6281 Muscle weakness (generalized): Secondary | ICD-10-CM

## 2018-09-19 DIAGNOSIS — R262 Difficulty in walking, not elsewhere classified: Secondary | ICD-10-CM

## 2018-09-19 NOTE — Therapy (Signed)
[place] MAIN [facility] SERVICES [address] [place], [place], [address] Phone: [phone]   Fax:  [phone]  Physical Therapy Progress Note Dates of reporting period  08/26/18  to 09/19/18     Patient Details  Name: Kenneth Summers MRN: [id number] Date of Birth: [birth year] Referring Provider (PT): Dr. [name], Guy Begin   Encounter Date: 09/19/2018  PT End of Session - 09/21/18 1230    Visit Number  40    Number of Visits  42    Date for PT Re-Evaluation  12/04/18    Authorization Type  Medicare reporting period starting 09/11/18    Authorization Time Period  Current cert period 0/0/7121-97/58/83    PT Start Time  1430    PT Stop Time  1515    PT Time Calculation (min)  45 min    Equipment Utilized During Treatment  Gait belt    Activity Tolerance  Patient limited by fatigue    Behavior During Therapy  [facility] for tasks assessed/performed       Past Medical History:  Diagnosis Date  . Alcohol abuse   . APS (antiphospholipid syndrome) ([place])   . BPH (benign prostatic hyperplasia)   . CAD (coronary artery disease)   . Cellulitis   . Cervical spinal stenosis   . Chronic osteomyelitis ([place])   . CKD (chronic kidney disease)   . CKD (chronic kidney disease)   . Clostridium difficile diarrhea   . Depression   . Diplopia   . Fatigue   . GERD (gastroesophageal reflux disease)   . Hyperlipemia   . Hypertension   . Hypovitaminosis D   . IBS (irritable bowel syndrome)   . 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 Insomnia   . Left lumbosacral radiculopathy   . Moderate obstructive sleep apnea   . Osteomyelitis of foot ([place])   . PAD (peripheral artery disease) ([place])   . Pruritus   . RA (rheumatoid arthritis) ([place])   . Radiculopathy   . Restless legs syndrome   . RLS (restless legs syndrome)   . SLE (systemic lupus erythematosus related syndrome) ([place])   . Stroke ([place])   . Toxic maculopathy of both eyes     Past Surgical History:  Procedure  Laterality Date  . CARDIAC CATHETERIZATION    . CAROTID ENDARTERECTOMY    . CERVICAL LAMINECTOMY    . CORONARY ANGIOPLASTY    . FRACTURE SURGERY    . fusion C5-6-7    . HERNIA REPAIR    . KNEE ARTHROSCOPY    . TONSILLECTOMY      There were no vitals filed for this visit.  Subjective Assessment - 09/21/18 1229    Subjective  Pt reports he is feeling good today.  His daughter states that the blister popped on his R ankle today, and that they sent a picture to the MD and he agrees that it looks good.    Patient is accompained by:  Family member    Pertinent History  Patient is a 73 y.o. male who presents to outpatient physical therapy with a referral for medical diagnosis of CVA. This patient's chief complaints consist of left hemiplegia and overall deconditioning leading to the following functional deficits: dependent for ADLs, IADLs, unable to transfer to car, dependent transfers at home with hoyer lift, unable to walk or stand without significant assistance, difficulty with W/C navigation..    Limitations  Sitting;Lifting;Standing;House hold activities;Writing;Walking;Other (comment)    Diagnostic tests  Brain MRI 11/01/2017:  IMPRESSION: 1. Acute right ACA territory infarct as demonstrated on prior CT imaging. No intracranial hemorrhage or significant mass effect. 2. 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    Patient Stated Goals  wants to be able to walk again    Currently in Pain?  No/denies      TREATMENT  There-ex   Hamstring stretch with 30 sec hold bilaterally    Piriformis stretch with 30 sec hold bilaterally    Figure 4 stretch with 30 sec hold  bilaterally    Bridging with knees on bolster x10 with a 3 second hold   SAQ in supine with improved ability to contract LLE x10 with a 5 second hold   Hip adduction in hooklying x10  Hip Abduction in hooklying with manual resistance x10    Neuromuscular  Re-education  Instructed patient in squat pivot transfer wheelchair<>mat table with modA+2 with patient requiring max VCs for hand placement and positioning; Patient able to come up to partial stand, but has significant difficulty with pivot requiring increased assistance   Bed mobility: rolling to L with cues to reach for PT's hand to facilitate rotating hips and to initiate a chin tuck x5 in each direction  Standing balance in RW mod-maxA+2 at walker: 1x 2 min; max verbal and tactile cueing for anterior pelvic weight shift, upright posture and weight shifting   Pt educated throughout session about proper posture and technique with exercises. Improved exercise technique, movement at target joints, use of target muscles after min to mod verbal, visual, tactile cues.       Pt continues to display good motivation throughout PT sessions.  He has met 1 short term goals but has not met any long term goals to date.  He has made improvements with his ability to roll to complete bed mobility, but still requires mod/maxA with remainder of bed mobility.  In PT sessions, he is able to complete a STS and squat pivot transfer from a power WC using a RW with modA+2 which indicates improvement from last assessment. He is unable to safely ambulate with a RW at time time.  He is also unable to safely perform stairs at this time.  He has made improvements in being able to anteriorly weight shift when cued to reach for targets anteriorly and laterally. Pt will benefit from continued skilled PT services in order to increase strength and functional mobility in order to decrease caregiver burden, improve his ability to perform ADLs, and to improve his QOL.        PT Short Term Goals - 09/11/18 1704      PT SHORT TERM GOAL #1   Title  Be independent with initial home exercise program for self-management of symptoms.    Baseline  HEP to be given at second session; advice to practice unsupported sitting at home  (06/19/2018); 07/08/18: Performing at home, 6/29: needs assistance but is doing exercise program regularly; 08/22/18: adherent;    Time  2    Period  Weeks    Status  Achieved    Target Date  06/26/18        PT Long Term Goals - 09/11/18 1704      PT LONG TERM GOAL #1   Title  Patient will complete all bed mobility with min A to improve functional independence for getting in and out of bed and adjusting in bed.     Baseline  max A - total A reported by family (  06/19/2018); 07/08/18: still requires maxA, dtr reports improvement in rolling since starting therapy; 6/23: min A to supervision in clinic; not doing at home right; 8/5: Pt's daughter states that his rolling has improved, but still has difficulty with all other bed mobility    Time  12    Period  Weeks    Status  Partially Met    Target Date  12/04/18      PT LONG TERM GOAL #2   Title  Patient will complete sit <> stand transfer chair to chair with Min A and LRAD to improve functional independence for household and community mobility.     Baseline  max A +2 STS at W/C (06/12/2018); max A +2-3 STS or stand pivot transfer, able to complete more reps (06/19/2018); 07/08/18: not safe to attempt at this time; 09/11/18: requires maxA+1 or modA+2 for STSs and squat pivot transfer    Time  12    Period  Weeks    Status  On-going    Target Date  12/04/18      PT LONG TERM GOAL #3   Title  Patient will navigate manual w/c with min A x 100 feet to improve mobility for household and short community distances.     Baseline  required total A (06/12/2018); able to roll 20 feet with min A (06/20/2018); 07/08/18: Pt can go approximately 10' without assist per family;    Time  12    Period  Weeks    Status  On-going    Target Date  12/04/18      PT LONG TERM GOAL #4   Title  Patient will complete car transfers W/C to family SUV with min A using LRAD to improve his abilty to participate in community activities.     Baseline  unable to complete car transfers at  all (06/12/2018); unable to complete car transfers at all (06/19/2018).; 07/08/18: Unable to perform at this time; 09/11/18: unable to perform at this time    Time  12    Period  Weeks    Status  On-going    Target Date  12/04/18      PT LONG TERM GOAL #5   Title  Patient will ambulate at least 150 feet with mod I using LRAD to improve mobility for household and community distances.     Baseline  unable to take steps (06/12/2018); able to shift R leg forward and back with max A and RW at edge of plinth (06/19/2018); 07/08/18: unable to ambulate at this time. 09/11/18: unable to perform at this time    Time  12    Period  Weeks    Status  On-going    Target Date  12/04/18      Additional Long Term Goals   Additional Long Term Goals  Yes      PT LONG TERM GOAL #6   Title  Patient will navigate 4 steps with min A and BUE support to improve his ability to access community and social participation.     Baseline  unable to complet stairs (06/12/2018); unable to complete stairs (06/19/2018); 07/08/18: unable to attempt at this time; 09/11/18: unsafe to attempt at this time    Time  12    Period  Weeks    Status  On-going    Target Date  12/04/18      PT LONG TERM GOAL #7   Title  Patient will increase functional reach test to >15 inches in sitting to  exhibit improved sitting balance and positioning and reduce fall risk;    Baseline  07/30/18: 12 inches; 09/11/18: 10-12 inches    Time  4    Period  Weeks    Status  Partially Met    Target Date  12/04/18            Plan - 09/21/18 1231    Clinical Impression Statement  Pt continues to display good motivation throughout PT sessions. He has met 1 short term goals but has not met any long term goals to date. He has made improvements with his ability to roll to complete bed mobility, but still requires mod/maxA with remainder of bed mobility. In PT sessions, he is able to complete a STS and squat pivot transfer from a power WC using a RW with modA+2 which  indicates improvement from last assessment. He is unable to safely ambulate with a RW at time time. He is also unable to safely perform stairs at this time. He has made improvements in being able to anteriorly weight shift when cued to reach for targets anteriorly and laterally. Pt will benefit from continued skilled PT services in order to increase strength and functional mobility in order to decrease caregiver burden, improve his ability to perform ADLs, and to improve his QOL.    Personal Factors and Comorbidities  Age;Comorbidity 3+    Comorbidities  Relevant past medical history and comorbidities include long term steroid use for lupus, CKD, chronic osteomyelitis, cervical spine stenosis, BPH, APS, alcohol abuse, peripheral artery disease, depression, GERD, obstructive sleep apnea, HTN, IBS, IgA deficiency, lumbar radiculopathy, RA, Stroke, toxic maculopathy in both eyes, systemic lupus erythematosus related syndrome, cardiac catheterization, carotid endarterectomy, cervical laminectomy, coronary angioplasty, Fusion C5-C7, knee arthroplasty, lumbar surgery.    Examination-Activity Limitations  Bed Mobility;Bend;Sit;Toileting;Stand;Stairs;Lift;Transfers;Squat;Locomotion Level;Carry;Dressing;Hygiene/Grooming;Continence    Examination-Participation Restrictions  Yard Work;Interpersonal Relationship;Community Activity    Stability/Clinical Decision Making  Evolving/Moderate complexity    Rehab Potential  Fair    PT Frequency  3x / week    PT Duration  12 weeks    PT Treatment/Interventions  ADLs/Self Care Home Management;Electrical Stimulation;Moist Heat;Cryotherapy;Gait training;Stair training;Functional mobility training;Therapeutic exercise;Balance training;Neuromuscular re-education;Cognitive remediation;Patient/family education;Orthotic Fit/Training;Wheelchair mobility training;Manual techniques;Passive range of motion;Energy conservation;Joint Manipulations    PT Next Visit Plan  Work on strength,  sitting/standing balance, functional activities such as rolling, bed mobility, transfers    PT Home Exercise Plan  HEP handout provided via Beth last week    Consulted and Agree with Plan of Care  Patient;Family member/caregiver    Family Member Consulted  daughter [name]       Patient will benefit from skilled therapeutic intervention in order to improve the following deficits and impairments:  Abnormal gait, Decreased activity tolerance, Decreased cognition, Decreased endurance, Decreased knowledge of use of DME, Decreased range of motion, Decreased skin integrity, Decreased strength, Impaired perceived functional ability, Impaired sensation, Impaired UE functional use, Improper body mechanics, Pain, Cardiopulmonary status limiting activity, Decreased balance, Decreased coordination, Decreased mobility, Difficulty walking, Impaired tone, Postural dysfunction  Visit Diagnosis: 1. Other lack of coordination   2. Muscle weakness (generalized)   3. Difficulty in walking, not elsewhere classified        Problem List There are no active problems to display for this patient.  This entire session was performed under direct supervision and direction of a licensed therapist/therapist assistant . I have personally read, edited and approve of the note as written.   Lutricia Horsfall, SPT Phillips Grout PT, DPT,  GCS  Huprich,Jason 09/21/2018, 12:33 PM  [place] MAIN [facility] SERVICES [address] [place], [place], [address] Phone: [phone]   Fax:  [phone]  Name: Kenneth Summers MRN: [id number] Date of Birth: 11/13/1945

## 2018-09-19 NOTE — Therapy (Signed)
[place] MAIN [facility] SERVICES [address] [place], [place], [address] Phone: [phone]   Fax:  [phone]  Occupational Therapy Treatment  Patient Details  Name: Kenneth Summers MRN: [id number] Date of Birth: February 21, 1945 No data recorded  Encounter Date: 09/19/2018  OT End of Session - 09/19/18 1719    Visit Number  38    Number of Visits  53    Date for OT Re-Evaluation  11/27/18    Authorization Type  Progress report period starting 06/12/2018    OT Start Time  26    OT Stop Time  1600    OT Time Calculation (min)  32 min    Activity Tolerance  Patient tolerated treatment well    Behavior During Therapy  [facility] for tasks assessed/performed       Past Medical History:  Diagnosis Date  . Alcohol abuse   . APS (antiphospholipid syndrome) ([place])   . BPH (benign prostatic hyperplasia)   . CAD (coronary artery disease)   . Cellulitis   . Cervical spinal stenosis   . Chronic osteomyelitis ([place])   . CKD (chronic kidney disease)   . CKD (chronic kidney disease)   . Clostridium difficile diarrhea   . Depression   . Diplopia   . Fatigue   . GERD (gastroesophageal reflux disease)   . Hyperlipemia   . Hypertension   . Hypovitaminosis D   . IBS (irritable bowel syndrome)   . IgA deficiency ([place])   . Insomnia   . Left lumbosacral radiculopathy   . Moderate obstructive sleep apnea   . Osteomyelitis of foot ([place])   . PAD (peripheral artery disease) ([place])   . Pruritus   . RA (rheumatoid arthritis) ([place])   . Radiculopathy   . Restless legs syndrome   . RLS (restless legs syndrome)   . SLE (systemic lupus erythematosus related syndrome) ([place])   . Stroke ([place])   . Toxic maculopathy of both eyes     Past Surgical History:  Procedure Laterality Date  . CARDIAC CATHETERIZATION    . CAROTID ENDARTERECTOMY    . CERVICAL LAMINECTOMY    . CORONARY ANGIOPLASTY    . FRACTURE SURGERY    . fusion C5-6-7    . HERNIA REPAIR    . KNEE  ARTHROSCOPY    . TONSILLECTOMY      There were no vitals filed for this visit.  Subjective Assessment - 09/19/18 1717    Subjective   Pt. reports that he needs to make sleep a priority this weekend.    Patient is accompanied by:  Family member    Pertinent History  Pt. is a 73 y.o. 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 Pt. PMHx includes: Multiple Falls, Lupus, DJD, Renal Abscess, CVA. Pt. resides with his wife. Pt.'s wife and daughter assist with ADLs. Pt. has caregivers in  for 2 hours a day, 6 days a week. Pt. received Rehab services in acute care, at SNF for STR, and [facility] services. Pt. is retired from [employer] for [employer] and [employer].    Currently in Pain?  No/denies      OT TREATMENT    Neuro muscular re-education:  Pt. performed resistive EZ Board exercises for forearm supination/pronation, wrist flexion/extension using gross grasp, and lateral pinch (key) grasp. Pt. performed resistive EZ Board exercises angled in several planes to promote shoulder flexion, abduction, and wrist flexion, and extension while performing resistive wrist  flexion and extension with a gross grip. Pt. Worked on grasping, and manipulating nuts, and bolts on a vertical tower. Pt. worked on stabilizing the bolts with his right hand while manipulating them with the left hand. Pt. worked on manipulating, and stacking coins using his left hand. Pt. required less verbal, and tactile cues to complete the task. Pt. Position was more aligned in midline in the w/c.                            OT Education - 09/19/18 1718    Education Details  LUE functioning    Person(s) Educated  Patient    Methods  Explanation;Demonstration    Comprehension  Verbalized understanding;Returned demonstration;Verbal cues required          OT Long Term Goals - 09/04/18 1425      OT LONG TERM GOAL #1   Title  Pt. will increase left shoulder flexion AROM by  10 degrees to access cabinet/shelf.    Baseline  AROM left shoulder flexion: 108    Time  12    Period  Weeks    Status  On-going    Target Date  11/27/18      OT LONG TERM GOAL #2   Title  Pt. will donn a shirt with Supervision.    Baseline  Min-ModA    Time  12    Period  Weeks    Status  On-going    Target Date  11/27/18      OT LONG TERM GOAL #3   Title  Pt. will require ModA to perform LE dressing    Baseline  MaxA left, ModA with the right    Time  12    Period  Weeks    Status  On-going    Target Date  11/27/18      OT LONG TERM GOAL #4   Title  Pt. will improve left grip strength by 10# to be able to open a jar/container.    Baseline  Left: 30, Right 40    Time  12    Period  Weeks    Status  On-going    Target Date  11/27/18      OT LONG TERM GOAL #5   Title  Pt. will improve left hand [facility] skills to be able to assit with buttoning/zipping.    Baseline  Left: 1 min & 50 sec. R: 56 sec.    Time  12    Period  Weeks    Status  New    Target Date  11/27/18      OT LONG TERM GOAL #6   Title  Pt. will demonstrate visual conmpensatory strategies during 100% of the time during ADLs    Baseline  Pt. requires min-mod for left side awareness for tabletop tasks in his near space, Max cues in the extrapersonal space    Time  12    Period  Weeks    Status  On-going    Target Date  11/27/18      OT LONG TERM GOAL #7   Title  Pt. will prepare a simple cold snack from w/c with supervision using cognitive compensatory strategies 100% of the time.    Baseline  Pt. is unable    Time  12    Period  Weeks    Status  On-going    Target Date  11/27/18      OT  LONG TERM GOAL #8   Title  Pt. will accurately identify potential safety hazard using good safety awareness, and judgement 100% for ADLs, and IADLs.    Baseline  limited    Time  12    Period  Weeks    Status  On-going    Target Date  11/27/18      OT LONG TERM GOAL  #9   TITLE  Pt. will  navigate his w/c around  obstacles with Supervision and 100% accuracy    Baseline  Pt. requires maxA, and heavy cuing for obstacle on the left    Time  12    Period  Weeks    Status  New    Target Date  11/27/18            Plan - 09/19/18 1719    Clinical Impression Statement  OT session was delayed starting today secondary to pt.being delayed finishing with PT. Pt. presented with improved w/c positioning today. Pt. reported meeting with the w/c vendor today, and some modifications/adjustments were made. Pt. continues to present with limited LUE functioning, and conitnues to work on increasing engagement fof the LUE during ADLs, and IADL tasks.    OT Occupational Profile and History  Problem Focused Assessment - Including review of records relating to presenting problem    Occupational performance deficits (Please refer to evaluation for details):  ADL's    Body Structure / Function / Physical Skills  UE functional use;Coordination;FMC;Dexterity;Strength;ROM    Cognitive Skills  Attention;Memory;Emotional;Problem Solve;Safety Awareness    Rehab Potential  Good    Clinical Decision Making  Several treatment options, min-mod task modification necessary    Comorbidities Affecting Occupational Performance:  Presence of comorbidities impacting occupational performance    Comorbidities impacting occupational performance description:  Phyical, cognitive, visual,  medical comorbidities    Modification or Assistance to Complete Evaluation   Min-Moderate modification of tasks or assist with assess necessary to complete eval    OT Frequency  3x / week    OT Duration  12 weeks    OT Treatment/Interventions  Self-care/ADL training;DME and/or AE instruction;Therapeutic exercise;Therapeutic activities;Moist Heat;Cognitive remediation/compensation;Neuromuscular education;Visual/perceptual remediation/compensation;Coping strategies training;Patient/family education;Passive range of motion;Psychosocial skills training;Energy  conservation;Functional Mobility Training    Recommended Other Services  PT    Consulted and Agree with Plan of Care  Patient    Family Member Consulted  Daughter [name]       Patient will benefit from skilled therapeutic intervention in order to improve the following deficits and impairments:   Body Structure / Function / Physical Skills: UE functional use, Coordination, FMC, Dexterity, Strength, ROM Cognitive Skills: Attention, Memory, Emotional, Problem Solve, Safety Awareness     Visit Diagnosis: 1. Muscle weakness (generalized)   2. Other lack of coordination       Problem List There are no active problems to display for this patient.   Harrel Carina, MS, OTR/L 09/19/2018, 5:28 PM  [place] MAIN [facility] SERVICES [address] [place], [place], [address] Phone: [phone]   Fax:  [phone]  Name: Kenneth Summers MRN: [id number] Date of Birth: 07-04-1945

## 2018-09-23 ENCOUNTER — Ambulatory Visit: Payer: Medicare Other

## 2018-09-23 ENCOUNTER — Encounter: Payer: Medicare Other | Admitting: Occupational Therapy

## 2018-09-24 ENCOUNTER — Ambulatory Visit: Payer: Medicare Other | Admitting: Physical Therapy

## 2018-09-24 ENCOUNTER — Ambulatory Visit: Payer: Medicare Other | Admitting: Occupational Therapy

## 2018-09-24 ENCOUNTER — Other Ambulatory Visit: Payer: Self-pay

## 2018-09-24 ENCOUNTER — Encounter: Payer: Self-pay | Admitting: Occupational Therapy

## 2018-09-24 ENCOUNTER — Encounter: Payer: Self-pay | Admitting: Physical Therapy

## 2018-09-24 DIAGNOSIS — R278 Other lack of coordination: Secondary | ICD-10-CM

## 2018-09-24 DIAGNOSIS — M6281 Muscle weakness (generalized): Secondary | ICD-10-CM

## 2018-09-24 DIAGNOSIS — R262 Difficulty in walking, not elsewhere classified: Secondary | ICD-10-CM

## 2018-09-24 NOTE — Therapy (Signed)
[place] MAIN [facility] SERVICES [address] [place], [place], [address] Phone: [phone]   Fax:  [phone]  Physical Therapy Treatment  Kenneth Summers Details  Name: Kenneth Summers MRN: [id number] Date of Birth: 1946-01-24 Referring Provider (PT): Dr. [name], Guy Begin   Encounter Date: 09/24/2018  PT End of Session - 09/24/18 1547    Visit Number  41    Number of Visits  67    Date for PT Re-Evaluation  12/04/18    Authorization Type  Medicare reporting period starting 09/11/18    Authorization Time Period  Current cert period 09/13/4164-08/06/14    PT Start Time  1445    PT Stop Time  1530    PT Time Calculation (min)  45 min    Equipment Utilized During Treatment  Gait belt    Activity Tolerance  Kenneth Summers limited by fatigue    Behavior During Therapy  [facility] for tasks assessed/performed       Past Medical History:  Diagnosis Date  . Alcohol abuse   . APS (antiphospholipid syndrome) ([place])   . BPH (benign prostatic hyperplasia)   . CAD (coronary artery disease)   . Cellulitis   . Cervical spinal stenosis   . Chronic osteomyelitis ([place])   . CKD (chronic kidney disease)   . CKD (chronic kidney disease)   . Clostridium difficile diarrhea   . Depression   . Diplopia   . Fatigue   . GERD (gastroesophageal reflux disease)   . Hyperlipemia   . Hypertension   . Hypovitaminosis D   . IBS (irritable bowel syndrome)   . 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 Insomnia   . Left lumbosacral radiculopathy   . Moderate obstructive sleep apnea   . Osteomyelitis of foot ([place])   . PAD (peripheral artery disease) ([place])   . Pruritus   . RA (rheumatoid arthritis) ([place])   . Radiculopathy   . Restless legs syndrome   . RLS (restless legs syndrome)   . SLE (systemic lupus erythematosus related syndrome) ([place])   . Stroke ([place])   . Toxic maculopathy of both eyes     Past Surgical History:  Procedure Laterality Date  . CARDIAC CATHETERIZATION    . CAROTID  ENDARTERECTOMY    . CERVICAL LAMINECTOMY    . CORONARY ANGIOPLASTY    . FRACTURE SURGERY    . fusion C5-6-7    . HERNIA REPAIR    . KNEE ARTHROSCOPY    . TONSILLECTOMY      There were no vitals filed for this visit.  Subjective Assessment - 09/24/18 1546    Subjective  Kenneth Summers reports feeling good; He reports working on [employer] well with better muscle activation but states that they are still challenging; Denies any soreness; denies any falls;    Kenneth Summers is accompained by:  Family member    Pertinent History  Kenneth Summers is a 73 y.o. male who presents to outpatient physical therapy with a referral for medical diagnosis of CVA. This Kenneth Summers's chief complaints consist of left hemiplegia and overall deconditioning leading to the following functional deficits: dependent for ADLs, IADLs, unable to transfer to car, dependent transfers at home with hoyer lift, unable to walk or stand without significant assistance, difficulty with W/C navigation..    Limitations  Sitting;Lifting;Standing;House hold activities;Writing;Walking;Other (comment)    Diagnostic tests  Brain MRI 11/01/2017: IMPRESSION: 1. Acute right ACA territory infarct as demonstrated on prior CT imaging. No intracranial hemorrhage or significant mass effect. 2. Age  advanced global brain atrophy and small chronic right occipital Infarct.    Kenneth Summers Stated Goals  wants to be able to walk again    Currently in Pain?  No/denies    Multiple Pain Sites  No         TREATMENT: Kenneth Summers instructed in sit<>Stand transfer from power chair using RW for safety: Mod A +2 for transfer x3 reps with mod-max VCs for hand placement, forward trunk lean and to improve erect posture and weight shift upon standing; Kenneth Summers hesitant to initiate forward trunk lean requiring increased cues for weight shift and positioning; Upon standing, he exhibits forward flexed posture with heavy posterior trunk lean requiring mod A to keep balance; Kenneth Summers fatigues quickly often  sitting down within 10-15 seconds upon standing. He required mod VCs and tactile cues to reach back for wheelchair prior to sitting with RUE for better safety awareness;  Progressed transfers instructing Kenneth Summers in 5 times sit<>Stand transfers with RW, mod a +2 with cues for hand placement, forward trunk lean and hip extension upon standing; Kenneth Summers continues to stand with increased hip/knee flexion, particularly on LLE due to weakness and fatigue; He was able to exhibit better safety awareness, reaching back with RUE requiring less assistance with increased repetition;   Instructed Kenneth Summers in scoot pivot transfer wheelchair<>mat table x1 reps each direction, mod A +2 with Kenneth Summers requiring increased time for proper positioning and set up for better transfer ability; he is able to participate better in transfer with time given for hip positioning prior to pivot.  Sitting edge of mat table: Instructed Kenneth Summers in neutral positioning sitting edge of mat table. Initially Kenneth Summers exhibits heavy posterior lean, requiring mod A +1-2 for sitting balance. Utilized mirror for better visual cues to improve erect posture; with increased repetition and cues, Kenneth Summers able to achieve neutral position but often with slumped posture; PT applied wedge under left hip to provide tactile cues to reduce left lateral trunk lean; He was able to achieve better sitting balance with wedge;   Instructed Kenneth Summers in scapular retraction x10 reps, unsupported with mod-min A +2 for balance with increased cues to reduce excess ROM to avoid posterior trunk lean; Kenneth Summers required increased cues to maintain erect posture with anterior pelvic tilt, while retracting scapulae without leaning backwards. He was able to achieve been trunk control with less ROM.  Seated, reaching forward cross body outside base of support x5 reps each direction to challenge forward weight shift with min A +1 for balance control. Kenneth Summers hesitant to reach forward  due to fear of falling forward; With increased repetition he was able to exhibit better weight shift and improved trunk control;   Response to treatment: pt fatigued at end of session, requiring increased assistance for proper positioning into wheelchair. He exhibited increased posterior trunk lean this session requiring increased cues for weight shift and better trunk control for improved mobility;                       PT Education - 09/24/18 1547    Education Details  transfers/positioning; trunk control;    Person(s) Educated  Kenneth Summers;Child(ren)    Methods  Explanation;Verbal cues    Comprehension  Verbalized understanding;Returned demonstration;Verbal cues required;Need further instruction       PT Short Term Goals - 09/11/18 1704      PT SHORT TERM GOAL #1   Title  Be independent with initial home exercise program for self-management of symptoms.    Baseline  HEP to be given at second session; advice to practice unsupported sitting at home (06/19/2018); 07/08/18: Performing at home, 6/29: needs assistance but is doing exercise program regularly; 08/22/18: adherent;    Time  2    Period  Weeks    Status  Achieved    Target Date  06/26/18        PT Long Term Goals - 09/11/18 1704      PT LONG TERM GOAL #1   Title  Kenneth Summers will complete all bed mobility with min A to improve functional independence for getting in and out of bed and adjusting in bed.     Baseline  max A - total A reported by family (06/19/2018); 07/08/18: still requires maxA, dtr reports improvement in rolling since starting therapy; 6/23: min A to supervision in clinic; not doing at home right; 8/5: Pt's daughter states that his rolling has improved, but still has difficulty with all other bed mobility    Time  12    Period  Weeks    Status  Partially Met    Target Date  12/04/18      PT LONG TERM GOAL #2   Title  Kenneth Summers will complete sit <> stand transfer chair to chair with Min A and LRAD to  improve functional independence for household and community mobility.     Baseline  max A +2 STS at W/C (06/12/2018); max A +2-3 STS or stand pivot transfer, able to complete more reps (06/19/2018); 07/08/18: not safe to attempt at this time; 09/11/18: requires maxA+1 or modA+2 for STSs and squat pivot transfer    Time  12    Period  Weeks    Status  On-going    Target Date  12/04/18      PT LONG TERM GOAL #3   Title  Kenneth Summers will navigate manual w/c with min A x 100 feet to improve mobility for household and short community distances.     Baseline  required total A (06/12/2018); able to roll 20 feet with min A (06/20/2018); 07/08/18: Pt can go approximately 10' without assist per family;    Time  12    Period  Weeks    Status  On-going    Target Date  12/04/18      PT LONG TERM GOAL #4   Title  Kenneth Summers will complete car transfers W/C to family SUV with min A using LRAD to improve his abilty to participate in community activities.     Baseline  unable to complete car transfers at all (06/12/2018); unable to complete car transfers at all (06/19/2018).; 07/08/18: Unable to perform at this time; 09/11/18: unable to perform at this time    Time  12    Period  Weeks    Status  On-going    Target Date  12/04/18      PT LONG TERM GOAL #5   Title  Kenneth Summers will ambulate at least 150 feet with mod I using LRAD to improve mobility for household and community distances.     Baseline  unable to take steps (06/12/2018); able to shift R leg forward and back with max A and RW at edge of plinth (06/19/2018); 07/08/18: unable to ambulate at this time. 09/11/18: unable to perform at this time    Time  12    Period  Weeks    Status  On-going    Target Date  12/04/18      Additional Long Term Goals   Additional  Long Term Goals  Yes      PT LONG TERM GOAL #6   Title  Kenneth Summers will navigate 4 steps with min A and BUE support to improve his ability to access community and social participation.     Baseline  unable to complet  stairs (06/12/2018); unable to complete stairs (06/19/2018); 07/08/18: unable to attempt at this time; 09/11/18: unsafe to attempt at this time    Time  12    Period  Weeks    Status  On-going    Target Date  12/04/18      PT LONG TERM GOAL #7   Title  Kenneth Summers will increase functional reach test to >15 inches in sitting to exhibit improved sitting balance and positioning and reduce fall risk;    Baseline  07/30/18: 12 inches; 09/11/18: 10-12 inches    Time  4    Period  Weeks    Status  Partially Met    Target Date  12/04/18            Plan - 09/24/18 1547    Clinical Impression Statement  Kenneth Summers motivated and participated well within session. He continues to require cues for weight shift and upper trunk control for better transfer ability; Kenneth Summers also requires increased verbal and tactile cues for hand placement for better safety awarness with transfers. Kenneth Summers continues to have heavy posterior lean throughout session requiring increased assistance for neutral trunk/weight shift. Kenneth Summers reports increased fatigue at end of session. He does improve in mobility when given extra time for proper set up prior to transfer.He would benefit from additional skilled PT Intervention to improve strength, balance and mobility;    Personal Factors and Comorbidities  Age;Comorbidity 3+    Comorbidities  Relevant past medical history and comorbidities include long term steroid use for lupus, CKD, chronic osteomyelitis, cervical spine stenosis, BPH, APS, alcohol abuse, peripheral artery disease, depression, GERD, obstructive sleep apnea, HTN, IBS, IgA deficiency, lumbar radiculopathy, RA, Stroke, toxic maculopathy in both eyes, systemic lupus erythematosus related syndrome, cardiac catheterization, carotid endarterectomy, cervical laminectomy, coronary angioplasty, Fusion C5-C7, knee arthroplasty, lumbar surgery.    Examination-Activity Limitations  Bed  Mobility;Bend;Sit;Toileting;Stand;Stairs;Lift;Transfers;Squat;Locomotion Level;Carry;Dressing;Hygiene/Grooming;Continence    Examination-Participation Restrictions  Yard Work;Interpersonal Relationship;Community Activity    Stability/Clinical Decision Making  Evolving/Moderate complexity    Rehab Potential  Fair    PT Frequency  3x / week    PT Duration  12 weeks    PT Treatment/Interventions  ADLs/Self Care Home Management;Electrical Stimulation;Moist Heat;Cryotherapy;Gait training;Stair training;Functional mobility training;Therapeutic exercise;Balance training;Neuromuscular re-education;Cognitive remediation;Kenneth Summers/family education;Orthotic Fit/Training;Wheelchair mobility training;Manual techniques;Passive range of motion;Energy conservation;Joint Manipulations    PT Next Visit Plan  Work on strength, sitting/standing balance, functional activities such as rolling, bed mobility, transfers    PT Home Exercise Plan  HEP handout provided via Beth last week    Consulted and Agree with Plan of Care  Kenneth Summers;Family member/caregiver    Family Member Consulted  daughter [name]       Kenneth Summers will benefit from skilled therapeutic intervention in order to improve the following deficits and impairments:  Abnormal gait, Decreased activity tolerance, Decreased cognition, Decreased endurance, Decreased knowledge of use of DME, Decreased range of motion, Decreased skin integrity, Decreased strength, Impaired perceived functional ability, Impaired sensation, Impaired UE functional use, Improper body mechanics, Pain, Cardiopulmonary status limiting activity, Decreased balance, Decreased coordination, Decreased mobility, Difficulty walking, Impaired tone, Postural dysfunction  Visit Diagnosis: 1. Muscle weakness (generalized)   2. Other lack of coordination   3. Difficulty in walking,  not elsewhere classified        Problem List There are no active problems to display for this  Kenneth Summers.   Anum Palecek PT, DPT 09/24/2018, 3:49 PM  [place] MAIN [facility] SERVICES [address] [place], [place], [address] Phone: [phone]   Fax:  [phone]  Name: KLINT LEZCANO MRN: [id number] Date of Birth: 01/05/1946

## 2018-09-24 NOTE — Therapy (Signed)
[place] MAIN [facility] SERVICES [address] [place], [place], [address] Phone: [phone]   Fax:  [phone]  Occupational Therapy Treatment  Patient Details  Name: Kenneth Summers MRN: [id number] Date of Birth: April 09, 1945 No data recorded  Encounter Date: 09/24/2018  OT End of Session - 09/24/18 1455    Visit Number  39    Number of Visits  48    Date for OT Re-Evaluation  11/27/18    OT Start Time  1345    OT Stop Time  1430    OT Time Calculation (min)  45 min    Activity Tolerance  Patient tolerated treatment well    Behavior During Therapy  [facility] for tasks assessed/performed       Past Medical History:  Diagnosis Date  . Alcohol abuse   . APS (antiphospholipid syndrome) ([place])   . BPH (benign prostatic hyperplasia)   . CAD (coronary artery disease)   . Cellulitis   . Cervical spinal stenosis   . Chronic osteomyelitis ([place])   . CKD (chronic kidney disease)   . CKD (chronic kidney disease)   . Clostridium difficile diarrhea   . Depression   . Diplopia   . Fatigue   . GERD (gastroesophageal reflux disease)   . Hyperlipemia   . Hypertension   . Hypovitaminosis D   . IBS (irritable bowel syndrome)   . IgA deficiency ([place])   . Insomnia   . Left lumbosacral radiculopathy   . Moderate obstructive sleep apnea   . Osteomyelitis of foot ([place])   . PAD (peripheral artery disease) ([place])   . Pruritus   . RA (rheumatoid arthritis) ([place])   . Radiculopathy   . Restless legs syndrome   . RLS (restless legs syndrome)   . SLE (systemic lupus erythematosus related syndrome) ([place])   . Stroke ([place])   . Toxic maculopathy of both eyes     Past Surgical History:  Procedure Laterality Date  . CARDIAC CATHETERIZATION    . CAROTID ENDARTERECTOMY    . CERVICAL LAMINECTOMY    . CORONARY ANGIOPLASTY    . FRACTURE SURGERY    . fusion C5-6-7    . HERNIA REPAIR    . KNEE ARTHROSCOPY    . TONSILLECTOMY      There were no vitals filed  for this visit.  Subjective Assessment - 09/24/18 1453    Subjective   Pt. reports that he slept alot this past weekend.    Patient is accompanied by:  Family member    Pertinent History  Pt. is a 73 y.o. 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 Pt. PMHx includes: Multiple Falls, Lupus, DJD, Renal Abscess, CVA. Pt. resides with his wife. Pt.'s wife and daughter assist with ADLs. Pt. has caregivers in  for 2 hours a day, 6 days a week. Pt. received Rehab services in acute care, at SNF for STR, and [employer] services. Pt. is retired from [employer] for [employer] and [employer].    Patient Stated Goals  To regain use of his left UE, and do more for himself.    Currently in Pain?  No/denies    Pain Orientation  Right;Medial    Pain Descriptors / Indicators  Discomfort    Pain Type  Acute pain    Pain Orientation  Left    Pain Descriptors / Indicators  Aching;Sore    Pain Type  Acute pain    Pain  Onset  Yesterday    Pain Frequency  Intermittent      OT TREATMENT    Therapeutic Activities:  Pt. worked on Astronomer patterns using small parquetry blocks. Pt. Worked on visual scanning for the parquetry blocks positioned further to the left. Pt. Required verbal and tactile cues to use his left hand. Pt. Was able to complete simple, and moderately complex desins with increased time to complete.                          OT Education - 09/24/18 1455    Education Details  LUE functioning    Person(s) Educated  Patient    Methods  Explanation;Demonstration    Comprehension  Verbalized understanding;Returned demonstration;Verbal cues required          OT Long Term Goals - 09/04/18 1425      OT LONG TERM GOAL #1   Title  Pt. will increase left shoulder flexion AROM by 10 degrees to access cabinet/shelf.    Baseline  AROM left shoulder flexion: 108    Time  12    Period  Weeks    Status  On-going    Target Date   11/27/18      OT LONG TERM GOAL #2   Title  Pt. will donn a shirt with Supervision.    Baseline  Min-ModA    Time  12    Period  Weeks    Status  On-going    Target Date  11/27/18      OT LONG TERM GOAL #3   Title  Pt. will require ModA to perform LE dressing    Baseline  MaxA left, ModA with the right    Time  12    Period  Weeks    Status  On-going    Target Date  11/27/18      OT LONG TERM GOAL #4   Title  Pt. will improve left grip strength by 10# to be able to open a jar/container.    Baseline  Left: 30, Right 40    Time  12    Period  Weeks    Status  On-going    Target Date  11/27/18      OT LONG TERM GOAL #5   Title  Pt. will improve left hand [facility] skills to be able to assit with buttoning/zipping.    Baseline  Left: 1 min & 50 sec. R: 56 sec.    Time  12    Period  Weeks    Status  New    Target Date  11/27/18      OT LONG TERM GOAL #6   Title  Pt. will demonstrate visual conmpensatory strategies during 100% of the time during ADLs    Baseline  Pt. requires min-mod for left side awareness for tabletop tasks in his near space, Max cues in the extrapersonal space    Time  12    Period  Weeks    Status  On-going    Target Date  11/27/18      OT LONG TERM GOAL #7   Title  Pt. will prepare a simple cold snack from w/c with supervision using cognitive compensatory strategies 100% of the time.    Baseline  Pt. is unable    Time  12    Period  Weeks    Status  On-going    Target Date  11/27/18  OT LONG TERM GOAL #8   Title  Pt. will accurately identify potential safety hazard using good safety awareness, and judgement 100% for ADLs, and IADLs.    Baseline  limited    Time  12    Period  Weeks    Status  On-going    Target Date  11/27/18      OT LONG TERM GOAL  #9   TITLE  Pt. will  navigate his w/c around obstacles with Supervision and 100% accuracy    Baseline  Pt. requires maxA, and heavy cuing for obstacle on the left    Time  12    Period  Weeks     Status  New    Target Date  11/27/18            Plan - 09/24/18 1456    Clinical Impression Statement  Pt.'s daughter reports that the patient experienced an episode of seeing blue light with his eyes when he was leaving home to come to therapy. The pt. and his daughter report no additional episodes after they contacted the physician, and came to therapy this afternoon. Pt. BP 122/72. Pt. continues to work on improving left sided awareness, attention, and LUE functioning during ADLs, and IADLs.    OT Occupational Profile and History  Problem Focused Assessment - Including review of records relating to presenting problem    Occupational performance deficits (Please refer to evaluation for details):  ADL's    Body Structure / Function / Physical Skills  UE functional use;Coordination;FMC;Dexterity;Strength;ROM    Cognitive Skills  Attention;Memory;Emotional;Problem Solve;Safety Awareness    Rehab Potential  Good    Clinical Decision Making  Several treatment options, min-mod task modification necessary    Comorbidities Affecting Occupational Performance:  Presence of comorbidities impacting occupational performance    Comorbidities impacting occupational performance description:  Phyical, cognitive, visual,  medical comorbidities    Modification or Assistance to Complete Evaluation   Min-Moderate modification of tasks or assist with assess necessary to complete eval    OT Frequency  3x / week    OT Duration  12 weeks    OT Treatment/Interventions  Self-care/ADL training;DME and/or AE instruction;Therapeutic exercise;Therapeutic activities;Moist Heat;Cognitive remediation/compensation;Neuromuscular education;Visual/perceptual remediation/compensation;Coping strategies training;Patient/family education;Passive range of motion;Psychosocial skills training;Energy conservation;Functional Mobility Training    Consulted and Agree with Plan of Care  Patient    Family Member Consulted  Daughter  [name]       Patient will benefit from skilled therapeutic intervention in order to improve the following deficits and impairments:   Body Structure / Function / Physical Skills: UE functional use, Coordination, FMC, Dexterity, Strength, ROM Cognitive Skills: Attention, Memory, Emotional, Problem Solve, Safety Awareness     Visit Diagnosis: 1. Muscle weakness (generalized)   2. Other lack of coordination       Problem List There are no active problems to display for this patient.   Harrel Carina, MS, OTR/L 09/24/2018, 3:07 PM  [place] MAIN [facility] SERVICES [address] [place], [place], [address] Phone: [phone]   Fax:  [phone]  Name: Kenneth Summers MRN: [id number] Date of Birth: 1945/04/17

## 2018-09-25 ENCOUNTER — Ambulatory Visit: Payer: Medicare Other | Admitting: Physical Therapy

## 2018-09-25 ENCOUNTER — Encounter: Payer: Self-pay | Admitting: Occupational Therapy

## 2018-09-25 ENCOUNTER — Ambulatory Visit: Payer: Medicare Other | Admitting: Occupational Therapy

## 2018-09-25 ENCOUNTER — Other Ambulatory Visit: Payer: Self-pay

## 2018-09-25 DIAGNOSIS — R278 Other lack of coordination: Secondary | ICD-10-CM | POA: Diagnosis not present

## 2018-09-25 DIAGNOSIS — M6281 Muscle weakness (generalized): Secondary | ICD-10-CM

## 2018-09-25 NOTE — Therapy (Addendum)
[address] MAIN [facility] SERVICES [address] [place], [place], [address] Phone: [phone]   Fax:  [phone]  Occupational Therapy Progress Note  Dates of reporting period  06/12/2018   to   09/25/2018  Patient Details  Name: Kenneth Summers MRN: [id number] Date of Birth: [birth year] No data recorded  Encounter Date: 09/25/2018  OT End of Session - 09/25/18 1441    Visit Number  40    Number of Visits  19    Date for OT Re-Evaluation  11/27/18    Authorization Type  Progress report period starting 09/26/2018    OT Start Time  1350    OT Stop Time  1430    OT Time Calculation (min)  40 min    Activity Tolerance  Patient tolerated treatment well    Behavior During Therapy  [facility] for tasks assessed/performed       Past Medical History:  Diagnosis Date  . Alcohol abuse   . APS (antiphospholipid syndrome) ([place])   . BPH (benign prostatic hyperplasia)   . CAD (coronary artery disease)   . Cellulitis   . Cervical spinal stenosis   . Chronic osteomyelitis ([place])   . CKD (chronic kidney disease)   . CKD (chronic kidney disease)   . Clostridium difficile diarrhea   . Depression   . Diplopia   . Fatigue   . GERD (gastroesophageal reflux disease)   . Hyperlipemia   . Hypertension   . Hypovitaminosis D   . IBS (irritable bowel syndrome)   . 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 Insomnia   . Left lumbosacral radiculopathy   . Moderate obstructive sleep apnea   . Osteomyelitis of foot ([place])   . PAD (peripheral artery disease) ([place])   . Pruritus   . RA (rheumatoid arthritis) ([place])   . Radiculopathy   . Restless legs syndrome   . RLS (restless legs syndrome)   . SLE (systemic lupus erythematosus related syndrome) ([place])   . Stroke ([place])   . Toxic maculopathy of both eyes     Past Surgical History:  Procedure Laterality Date  . CARDIAC CATHETERIZATION    . CAROTID ENDARTERECTOMY    . CERVICAL LAMINECTOMY    . CORONARY ANGIOPLASTY    . FRACTURE  SURGERY    . fusion C5-6-7    . HERNIA REPAIR    . KNEE ARTHROSCOPY    . TONSILLECTOMY      There were no vitals filed for this visit.  Subjective Assessment - 09/25/18 1441    Subjective   Pt. waited outside a long time for his ride to come for therapy today.    Patient is accompanied by:  Family member    Pertinent History  Pt. is a 73 y.o. 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 Pt. PMHx includes: Multiple Falls, Lupus, DJD, Renal Abscess, CVA. Pt. resides with his wife. Pt.'s wife and daughter assist with ADLs. Pt. has caregivers in  for 2 hours a day, 6 days a week. Pt. received Rehab services in acute care, at SNF for STR, and [facility]. Pt. is retired from [employer] for [employer] and [employer].    Currently in Pain?  Yes    Pain Score  --   Not rated   Pain Location  Leg    Pain Orientation  Left    Pain Descriptors / Indicators  --   Restless leg pain  OT TREATMENT    Neuro muscular re-education:  Pt. worked on using his RUE to cross midline, and grasp washers from a magnetic dish. Pt. worked on abducting his LUE, and reaching to place the washers onto magnetic hooks positioned on an elevated vertical angle. Pt. Minimal cues to use his LUE. The tasks was positioned further  To the left to challenge , and work on visal scanning to the left. Pt. was distracted at times by LE restless leg syndrome. Rest breaks were required. Pt. Worked on bilateral hand coordination skills untying knots on rope of medium thickness.                         OT Education - 09/25/18 1444    Education Details  LUE functioning    Person(s) Educated  Patient    Methods  Explanation;Demonstration    Comprehension  Verbalized understanding;Returned demonstration;Verbal cues required          OT Long Term Goals - 09/25/18 1719      OT LONG TERM GOAL #1   Title  Pt. will increase left shoulder flexion AROM by 10 degrees to  access cabinet/shelf.    Baseline  AROM left shoulder flexion: 108    Time  12    Period  Weeks    Status  On-going    Target Date  11/27/18      OT LONG TERM GOAL #2   Title  Pt. will donn a shirt with Supervision.    Baseline  Min-ModA    Time  12    Period  Weeks    Status  On-going    Target Date  11/27/18      OT LONG TERM GOAL #3   Title  Pt. will require ModA to perform LE dressing    Baseline  MaxA left, ModA with the right    Time  12    Period  Weeks    Status  On-going    Target Date  11/27/18      OT LONG TERM GOAL #4   Title  Pt. will improve left grip strength by 10# to be able to open a jar/container.    Baseline  Left: 30, Right 40    Time  12    Period  Weeks    Status  On-going    Target Date  11/27/18      OT LONG TERM GOAL #5   Title  Pt. will improve left hand [facility] skills to be able to assit with buttoning/zipping.    Baseline  Left: 1 min & 50 sec. R: 56 sec.    Time  12    Period  Weeks    Status  New    Target Date  11/27/18      OT LONG TERM GOAL #6   Title  Pt. will demonstrate visual conmpensatory strategies during 100% of the time during ADLs    Baseline  Pt. requires min-mod for left side awareness for tabletop tasks in his near space, Max cues in the extrapersonal space    Time  12    Period  Weeks    Status  On-going    Target Date  11/27/18      OT LONG TERM GOAL #7   Title  Pt. will prepare a simple cold snack from w/c with supervision using cognitive compensatory strategies 100% of the time.    Baseline  Pt. is unable  Time  12    Period  Weeks    Status  On-going    Target Date  11/27/18      OT LONG TERM GOAL #8   Title  Pt. will accurately identify potential safety hazard using good safety awareness, and judgement 100% for ADLs, and IADLs.    Baseline  limited    Time  12    Period  Weeks    Status  On-going    Target Date  11/27/18      OT LONG TERM GOAL  #9   TITLE  Pt. will  navigate his w/c around obstacles with  Supervision and 100% accuracy    Baseline  Pt. requires maxA, and heavy cuing for obstacle on the left    Time  12    Period  Weeks    Status  On-going    Target Date  11/27/18            Plan - 09/25/18 1444    Clinical Impression Statement  Pt. missed his PT session secondary to transportation being late to pick him up for treatment today. Pt. has restless leg syndrome which is distracting for the pt. Pt. was assisted with position changes. Pt. continues to work on improving LUE strength, and [facility] skills in order to improve LUE functioning, as well as to increase engagement during ADLs, and IADL tasks.    OT Occupational Profile and History  Problem Focused Assessment - Including review of records relating to presenting problem    Occupational performance deficits (Please refer to evaluation for details):  ADL's    Body Structure / Function / Physical Skills  UE functional use;Coordination;FMC;Dexterity;Strength;ROM    Cognitive Skills  Attention;Memory;Emotional;Problem Solve;Safety Awareness    Rehab Potential  Good    Clinical Decision Making  Several treatment options, min-mod task modification necessary    Comorbidities Affecting Occupational Performance:  Presence of comorbidities impacting occupational performance    Comorbidities impacting occupational performance description:  Phyical, cognitive, visual,  medical comorbidities    Modification or Assistance to Complete Evaluation   Min-Moderate modification of tasks or assist with assess necessary to complete eval    OT Frequency  3x / week    OT Duration  12 weeks    OT Treatment/Interventions  Self-care/ADL training;DME and/or AE instruction;Therapeutic exercise;Therapeutic activities;Moist Heat;Cognitive remediation/compensation;Neuromuscular education;Visual/perceptual remediation/compensation;Coping strategies training;Patient/family education;Passive range of motion;Psychosocial skills training;Energy conservation;Functional  Mobility Training    Consulted and Agree with Plan of Care  Patient    Family Member Consulted  Daughter [name]       Patient will benefit from skilled therapeutic intervention in order to improve the following deficits and impairments:   Body Structure / Function / Physical Skills: UE functional use, Coordination, FMC, Dexterity, Strength, ROM Cognitive Skills: Attention, Memory, Emotional, Problem Solve, Safety Awareness     Visit Diagnosis: 1. Muscle weakness (generalized)   2. Other lack of coordination       Problem List There are no active problems to display for this patient.   Harrel Carina, MS, OTR/L 09/25/2018, 5:21 PM  [place] MAIN [facility] SERVICES [address] [place], [place], [address] Phone: [phone]   Fax:  [phone]  Name: Kenneth Summers MRN: [id number] Date of Birth: 28-Jun-1945

## 2018-09-26 ENCOUNTER — Ambulatory Visit: Payer: Medicare Other | Admitting: Physical Therapy

## 2018-09-26 ENCOUNTER — Ambulatory Visit: Payer: Medicare Other | Admitting: Occupational Therapy

## 2018-09-26 ENCOUNTER — Encounter: Payer: Self-pay | Admitting: Physical Therapy

## 2018-09-26 ENCOUNTER — Encounter: Payer: Self-pay | Admitting: Occupational Therapy

## 2018-09-26 ENCOUNTER — Other Ambulatory Visit: Payer: Self-pay

## 2018-09-26 DIAGNOSIS — R262 Difficulty in walking, not elsewhere classified: Secondary | ICD-10-CM

## 2018-09-26 DIAGNOSIS — M6281 Muscle weakness (generalized): Secondary | ICD-10-CM

## 2018-09-26 DIAGNOSIS — R278 Other lack of coordination: Secondary | ICD-10-CM | POA: Diagnosis not present

## 2018-09-26 NOTE — Therapy (Signed)
[place] MAIN [facility] SERVICES [address] [place], [place], [address] Phone: [phone]   Fax:  [phone]  Physical Therapy Treatment  Patient Details  Name: Kenneth Summers MRN: [id number] Date of Birth: 05-Dec-1945 Referring Provider (PT): Dr. [name], Guy Begin   Encounter Date: 09/26/2018  PT End of Session - 09/26/18 1535    Visit Number  42    Number of Visits  76    Date for PT Re-Evaluation  12/04/18    Authorization Type  Medicare reporting period starting 09/11/18    Authorization Time Period  Current cert period 02/09/9700-63/78/58    PT Start Time  1431    PT Stop Time  1515    PT Time Calculation (min)  44 min    Equipment Utilized During Treatment  Gait belt    Activity Tolerance  Patient limited by fatigue    Behavior During Therapy  [facility] for tasks assessed/performed       Past Medical History:  Diagnosis Date  . Alcohol abuse   . APS (antiphospholipid syndrome) ([place])   . BPH (benign prostatic hyperplasia)   . CAD (coronary artery disease)   . Cellulitis   . Cervical spinal stenosis   . Chronic osteomyelitis ([place])   . CKD (chronic kidney disease)   . CKD (chronic kidney disease)   . Clostridium difficile diarrhea   . Depression   . Diplopia   . Fatigue   . GERD (gastroesophageal reflux disease)   . Hyperlipemia   . Hypertension   . Hypovitaminosis D   . IBS (irritable bowel syndrome)   . 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 Insomnia   . Left lumbosacral radiculopathy   . Moderate obstructive sleep apnea   . Osteomyelitis of foot ([place])   . PAD (peripheral artery disease) ([place])   . Pruritus   . RA (rheumatoid arthritis) ([place])   . Radiculopathy   . Restless legs syndrome   . RLS (restless legs syndrome)   . SLE (systemic lupus erythematosus related syndrome) ([place])   . Stroke ([place])   . Toxic maculopathy of both eyes     Past Surgical History:  Procedure Laterality Date  . CARDIAC CATHETERIZATION    . CAROTID  ENDARTERECTOMY    . CERVICAL LAMINECTOMY    . CORONARY ANGIOPLASTY    . FRACTURE SURGERY    . fusion C5-6-7    . HERNIA REPAIR    . KNEE ARTHROSCOPY    . TONSILLECTOMY      There were no vitals filed for this visit.  Subjective Assessment - 09/26/18 1534    Subjective  "I feel okay today. I have been working on my exercises at home." patient denies any new falls. Reports that his ankle is healing well. he does report increased LLE discomfort yesterday but states that its better today;    Patient is accompained by:  Family member    Pertinent History  Patient is a 73 y.o. male who presents to outpatient physical therapy with a referral for medical diagnosis of CVA. This patient's chief complaints consist of left hemiplegia and overall deconditioning leading to the following functional deficits: dependent for ADLs, IADLs, unable to transfer to car, dependent transfers at home with hoyer lift, unable to walk or stand without significant assistance, difficulty with W/C navigation..    Limitations  Sitting;Lifting;Standing;House hold activities;Writing;Walking;Other (comment)    Diagnostic tests  Brain MRI 11/01/2017: IMPRESSION: 1. Acute right ACA territory infarct as demonstrated on prior  CT imaging. No intracranial hemorrhage or significant mass effect. 2. 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    Patient Stated Goals  wants to be able to walk again    Currently in Pain?  No/denies    Multiple Pain Sites  No           TREATMENT: Patient instructed in sit<>Stand transfer from power chair using RW for safety: Mod A +2 for transfer x5 reps with mod-max VCs for hand placement, forward trunk lean and to improve erect posture and weight shift upon standing; Patient hesitant to initiate forward trunk lean requiring increased cues for weight shift and positioning; Upon standing, he exhibits forward flexed posture with heavy posterior trunk lean requiring mod A  to keep balance; Patient fatigues quickly often sitting down within 10-15 seconds upon standing. However he was able to stand for approximately 55 sec with one standing repetition.  He required mod VCs and tactile cues to reach back for wheelchair prior to sitting with RUE for better safety awareness; Patient exhibited increased LLE knee flexion and valgus positioning having difficulty activating hip/knee extension; This contributed to imbalance and having difficulty keeping erect posture. Patient heavily fatigued by last transfer requiring increased assistance to avoid loss of balance when sitting down (max A +2)  Upon sitting in chair, patient required mod A +2 for repositioning to erect sitting posture. He had difficulty leaning forward and scooting hips backward in chair. By last transfer, had to adjust power chair to recumbent position to reposition patient into neutral positioning as he was too fatigued to scoot back into chair. Patient exhibits increased left lateral trunk lean this session requiring visual and tactile cues to weight shift to right side;  Exercise: Instructed patient in LE exercise in sitting: LAQ with visual/tactile cues to increase AROM x5 reps each LE with 3 sec hold time. Patient able to initiate knee extension on LLE, requiring min A to achieve full ROM;  Seated: reaching forward to toes x5 reps x2 sets to facilitate better forward weight shift; Patient required min VCs for proper positioning. He is able to reach further down towards toes this session;  Seated unsupported from back of chair with cues for erect sitting posture x1-2 min; patient required mod VCs and visual cues using mirror to cue patient to erect sitting posture;   Response to treatment: pt fatigued at end of session, requiring increased assistance for proper positioning into wheelchair. He exhibited increased posterior trunk lean this session requiring increased cues for weight shift and better trunk  control for improved mobility; He also exhibits increased weakness in LLE upon standing with increased knee flexion/valgus position; He had a heavy left lateral trunk lean making transfers more difficult.                              PT Education - 09/26/18 1535    Education Details  transfers/exercise, HEP    Person(s) Educated  Patient    Methods  Explanation;Verbal cues    Comprehension  Verbalized understanding;Returned demonstration;Verbal cues required;Need further instruction       PT Short Term Goals - 09/11/18 1704      PT SHORT TERM GOAL #1   Title  Be independent with initial home exercise program for self-management of symptoms.    Baseline  HEP to be given at second session; advice to practice unsupported sitting at home (06/19/2018); 07/08/18: Performing at  home, 6/29: needs assistance but is doing exercise program regularly; 08/22/18: adherent;    Time  2    Period  Weeks    Status  Achieved    Target Date  06/26/18        PT Long Term Goals - 09/11/18 1704      PT LONG TERM GOAL #1   Title  Patient will complete all bed mobility with min A to improve functional independence for getting in and out of bed and adjusting in bed.     Baseline  max A - total A reported by family (06/19/2018); 07/08/18: still requires maxA, dtr reports improvement in rolling since starting therapy; 6/23: min A to supervision in clinic; not doing at home right; 8/5: Pt's daughter states that his rolling has improved, but still has difficulty with all other bed mobility    Time  12    Period  Weeks    Status  Partially Met    Target Date  12/04/18      PT LONG TERM GOAL #2   Title  Patient will complete sit <> stand transfer chair to chair with Min A and LRAD to improve functional independence for household and community mobility.     Baseline  max A +2 STS at W/C (06/12/2018); max A +2-3 STS or stand pivot transfer, able to complete more reps (06/19/2018); 07/08/18: not safe  to attempt at this time; 09/11/18: requires maxA+1 or modA+2 for STSs and squat pivot transfer    Time  12    Period  Weeks    Status  On-going    Target Date  12/04/18      PT LONG TERM GOAL #3   Title  Patient will navigate manual w/c with min A x 100 feet to improve mobility for household and short community distances.     Baseline  required total A (06/12/2018); able to roll 20 feet with min A (06/20/2018); 07/08/18: Pt can go approximately 10' without assist per family;    Time  12    Period  Weeks    Status  On-going    Target Date  12/04/18      PT LONG TERM GOAL #4   Title  Patient will complete car transfers W/C to family SUV with min A using LRAD to improve his abilty to participate in community activities.     Baseline  unable to complete car transfers at all (06/12/2018); unable to complete car transfers at all (06/19/2018).; 07/08/18: Unable to perform at this time; 09/11/18: unable to perform at this time    Time  12    Period  Weeks    Status  On-going    Target Date  12/04/18      PT LONG TERM GOAL #5   Title  Patient will ambulate at least 150 feet with mod I using LRAD to improve mobility for household and community distances.     Baseline  unable to take steps (06/12/2018); able to shift R leg forward and back with max A and RW at edge of plinth (06/19/2018); 07/08/18: unable to ambulate at this time. 09/11/18: unable to perform at this time    Time  12    Period  Weeks    Status  On-going    Target Date  12/04/18      Additional Long Term Goals   Additional Long Term Goals  Yes      PT LONG TERM GOAL #6   Title  Patient will navigate 4 steps with min A and BUE support to improve his ability to access community and social participation.     Baseline  unable to complet stairs (06/12/2018); unable to complete stairs (06/19/2018); 07/08/18: unable to attempt at this time; 09/11/18: unsafe to attempt at this time    Time  12    Period  Weeks    Status  On-going    Target Date  12/04/18       PT LONG TERM GOAL #7   Title  Patient will increase functional reach test to >15 inches in sitting to exhibit improved sitting balance and positioning and reduce fall risk;    Baseline  07/30/18: 12 inches; 09/11/18: 10-12 inches    Time  4    Period  Weeks    Status  Partially Met    Target Date  12/04/18            Plan - 09/26/18 1535    Clinical Impression Statement  Patient motivated this session, but heavily fatigued. He exhibits increased left lateral trunk lean having difficulty achieving forward weight shift and coming up into erect standing position. patient able to progress transfers being able to hold standing position for approximately 55 sec this session which is an improvement over last few sessions. He was also able to exhibit better muscle activation in LLE initiating LAQ with partial ROM. patient does fatigue quickly requiring increased seated rest breaks and having difficutly keeping erect sitting posture. he would benefit from additional skilled PT intervention to improve strength, balance and mobility;    Personal Factors and Comorbidities  Age;Comorbidity 3+    Comorbidities  Relevant past medical history and comorbidities include long term steroid use for lupus, CKD, chronic osteomyelitis, cervical spine stenosis, BPH, APS, alcohol abuse, peripheral artery disease, depression, GERD, obstructive sleep apnea, HTN, IBS, IgA deficiency, lumbar radiculopathy, RA, Stroke, toxic maculopathy in both eyes, systemic lupus erythematosus related syndrome, cardiac catheterization, carotid endarterectomy, cervical laminectomy, coronary angioplasty, Fusion C5-C7, knee arthroplasty, lumbar surgery.    Examination-Activity Limitations  Bed Mobility;Bend;Sit;Toileting;Stand;Stairs;Lift;Transfers;Squat;Locomotion Level;Carry;Dressing;Hygiene/Grooming;Continence    Examination-Participation Restrictions  Yard Work;Interpersonal Relationship;Community Activity    Stability/Clinical Decision  Making  Evolving/Moderate complexity    Rehab Potential  Fair    PT Frequency  3x / week    PT Duration  12 weeks    PT Treatment/Interventions  ADLs/Self Care Home Management;Electrical Stimulation;Moist Heat;Cryotherapy;Gait training;Stair training;Functional mobility training;Therapeutic exercise;Balance training;Neuromuscular re-education;Cognitive remediation;Patient/family education;Orthotic Fit/Training;Wheelchair mobility training;Manual techniques;Passive range of motion;Energy conservation;Joint Manipulations    PT Next Visit Plan  Work on strength, sitting/standing balance, functional activities such as rolling, bed mobility, transfers    PT Home Exercise Plan  HEP handout provided via [facility] last week    Consulted and Agree with Plan of Care  Patient;Family member/caregiver    Family Member Consulted  daughter [name]       Patient will benefit from skilled therapeutic intervention in order to improve the following deficits and impairments:  Abnormal gait, Decreased activity tolerance, Decreased cognition, Decreased endurance, Decreased knowledge of use of DME, Decreased range of motion, Decreased skin integrity, Decreased strength, Impaired perceived functional ability, Impaired sensation, Impaired UE functional use, Improper body mechanics, Pain, Cardiopulmonary status limiting activity, Decreased balance, Decreased coordination, Decreased mobility, Difficulty walking, Impaired tone, Postural dysfunction  Visit Diagnosis: Muscle weakness (generalized)  Other lack of coordination  Difficulty in walking, not elsewhere classified     Problem List There are no active problems to display for this patient.  ,MargaretPT, DPT 09/26/2018, 3:37 PM  [place] MAIN [facility] SERVICES [address] [place], [place], [address] Phone: [phone]   Fax:  [phone]  Name: Kenneth Summers MRN: [id number] Date of Birth: 1945/04/06

## 2018-09-26 NOTE — Therapy (Signed)
[place] MAIN [facility] SERVICES [address] [place], [place], [address] Phone: [phone]   Fax:  [phone]  Occupational Therapy Treatment  Patient Details  Name: Kenneth Summers MRN: [id number] Date of Birth: 07/13/1945 No data recorded  Encounter Date: 09/26/2018  OT End of Session - 09/26/18 1724    Visit Number  41    Number of Visits  56    Date for OT Re-Evaluation  11/27/18    Authorization Type  Progress report period starting 09/26/2018    OT Start Time  1515    OT Stop Time  1600    OT Time Calculation (min)  45 min    Activity Tolerance  Patient tolerated treatment well    Behavior During Therapy  [facility] for tasks assessed/performed       Past Medical History:  Diagnosis Date  . Alcohol abuse   . APS (antiphospholipid syndrome) ([place])   . BPH (benign prostatic hyperplasia)   . CAD (coronary artery disease)   . Cellulitis   . Cervical spinal stenosis   . Chronic osteomyelitis ([place])   . CKD (chronic kidney disease)   . CKD (chronic kidney disease)   . Clostridium difficile diarrhea   . Depression   . Diplopia   . Fatigue   . GERD (gastroesophageal reflux disease)   . Hyperlipemia   . Hypertension   . Hypovitaminosis D   . IBS (irritable bowel syndrome)   . 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 Insomnia   . Left lumbosacral radiculopathy   . Moderate obstructive sleep apnea   . Osteomyelitis of foot ([place])   . PAD (peripheral artery disease) ([place])   . Pruritus   . RA (rheumatoid arthritis) ([place])   . Radiculopathy   . Restless legs syndrome   . RLS (restless legs syndrome)   . SLE (systemic lupus erythematosus related syndrome) ([place])   . Stroke ([place])   . Toxic maculopathy of both eyes     Past Surgical History:  Procedure Laterality Date  . CARDIAC CATHETERIZATION    . CAROTID ENDARTERECTOMY    . CERVICAL LAMINECTOMY    . CORONARY ANGIOPLASTY    . FRACTURE SURGERY    . fusion C5-6-7    . HERNIA REPAIR    . KNEE  ARTHROSCOPY    . TONSILLECTOMY      There were no vitals filed for this visit.  Subjective Assessment - 09/26/18 1723    Subjective   Pt. reports he plans to rest this weekend    Patient is accompanied by:  Family member    Pertinent History  Pt. is a 73 y.o. 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 Pt. PMHx includes: Multiple Falls, Lupus, DJD, Renal Abscess, CVA. Pt. resides with his wife. Pt.'s wife and daughter assist with ADLs. Pt. has caregivers in  for 2 hours a day, 6 days a week. Pt. received Rehab services in acute care, at SNF for STR, and [place] services. Pt. is retired from [employer] for [employer] and [employer].    Patient Stated Goals  To regain use of his left UE, and do more for himself.    Currently in Pain?  No/denies      OT TREATMENT    Neuro muscular re-education:  Pt. worked on visual scanning tasks with emphasis placed on visual attention to the left using cards placed in random patterns at the tabletop. Pt. worked on sorting  the cards least to highest numbers of various suits. Pt. Worked with increasing the distance of the cards further to the left to widen the scan field. Pt. Worked on bilateral hand coordination holding the cards with the right hand, and sorting them by suit with the left hand. Pt. Required cues to use his left hand, however required fewer cues initially with increased cues needed as the task progressed.                         OT Education - 09/26/18 1724    Education Details  LUE functioning    Person(s) Educated  Patient    Methods  Explanation;Demonstration    Comprehension  Verbalized understanding;Returned demonstration;Verbal cues required          OT Long Term Goals - 09/25/18 1719      OT LONG TERM GOAL #1   Title  Pt. will increase left shoulder flexion AROM by 10 degrees to access cabinet/shelf.    Baseline  AROM left shoulder flexion: 108    Time  12    Period   Weeks    Status  On-going    Target Date  11/27/18      OT LONG TERM GOAL #2   Title  Pt. will donn a shirt with Supervision.    Baseline  Min-ModA    Time  12    Period  Weeks    Status  On-going    Target Date  11/27/18      OT LONG TERM GOAL #3   Title  Pt. will require ModA to perform LE dressing    Baseline  MaxA left, ModA with the right    Time  12    Period  Weeks    Status  On-going    Target Date  11/27/18      OT LONG TERM GOAL #4   Title  Pt. will improve left grip strength by 10# to be able to open a jar/container.    Baseline  Left: 30, Right 40    Time  12    Period  Weeks    Status  On-going    Target Date  11/27/18      OT LONG TERM GOAL #5   Title  Pt. will improve left hand [facility] skills to be able to assit with buttoning/zipping.    Baseline  Left: 1 min & 50 sec. R: 56 sec.    Time  12    Period  Weeks    Status  New    Target Date  11/27/18      OT LONG TERM GOAL #6   Title  Pt. will demonstrate visual conmpensatory strategies during 100% of the time during ADLs    Baseline  Pt. requires min-mod for left side awareness for tabletop tasks in his near space, Max cues in the extrapersonal space    Time  12    Period  Weeks    Status  On-going    Target Date  11/27/18      OT LONG TERM GOAL #7   Title  Pt. will prepare a simple cold snack from w/c with supervision using cognitive compensatory strategies 100% of the time.    Baseline  Pt. is unable    Time  12    Period  Weeks    Status  On-going    Target Date  11/27/18      OT LONG TERM  GOAL #8   Title  Pt. will accurately identify potential safety hazard using good safety awareness, and judgement 100% for ADLs, and IADLs.    Baseline  limited    Time  12    Period  Weeks    Status  On-going    Target Date  11/27/18      OT LONG TERM GOAL  #9   TITLE  Pt. will  navigate his w/c around obstacles with Supervision and 100% accuracy    Baseline  Pt. requires maxA, and heavy cuing for obstacle  on the left    Time  12    Period  Weeks    Status  On-going    Target Date  11/27/18            Plan - 09/26/18 1725    Clinical Impression Statement  Pt. continues to make steady progress, and is improving with left sided awareness, and sustained attention. Pt. reported no restless leg pain today. Pt. continues to work on improving visual scanning to the left, increasing left UE strength, coordination, increasing left UE engagement in ADL, and IADL functioning in order to maximize independence.    OT Occupational Profile and History  Problem Focused Assessment - Including review of records relating to presenting problem    Occupational performance deficits (Please refer to evaluation for details):  ADL's    Body Structure / Function / Physical Skills  UE functional use;Coordination;FMC;Dexterity;Strength;ROM    Cognitive Skills  Attention;Memory;Emotional;Problem Solve;Safety Awareness    Rehab Potential  Good    Clinical Decision Making  Several treatment options, min-mod task modification necessary    Comorbidities Affecting Occupational Performance:  Presence of comorbidities impacting occupational performance    Comorbidities impacting occupational performance description:  Phyical, cognitive, visual,  medical comorbidities    Modification or Assistance to Complete Evaluation   Min-Moderate modification of tasks or assist with assess necessary to complete eval    OT Frequency  3x / week    OT Duration  12 weeks    OT Treatment/Interventions  Self-care/ADL training;DME and/or AE instruction;Therapeutic exercise;Therapeutic activities;Moist Heat;Cognitive remediation/compensation;Neuromuscular education;Visual/perceptual remediation/compensation;Coping strategies training;Patient/family education;Passive range of motion;Psychosocial skills training;Energy conservation;Functional Mobility Training    Recommended Other Services  PT    Consulted and Agree with Plan of Care  Patient     Family Member Consulted  Daughter [name]       Patient will benefit from skilled therapeutic intervention in order to improve the following deficits and impairments:   Body Structure / Function / Physical Skills: UE functional use, Coordination, FMC, Dexterity, Strength, ROM Cognitive Skills: Attention, Memory, Emotional, Problem Solve, Safety Awareness     Visit Diagnosis: Muscle weakness (generalized)    Problem List There are no active problems to display for this patient.   Harrel Carina, MS, OTR/L 09/26/2018, 5:31 PM  [place] MAIN [facility] SERVICES [address] [place], [place], [address] Phone: [phone]   Fax:  [phone]  Name: Kenneth Summers MRN: [id number] Date of Birth: 03-Feb-1946

## 2018-09-30 ENCOUNTER — Encounter: Payer: Self-pay | Admitting: Physical Therapy

## 2018-09-30 ENCOUNTER — Encounter: Payer: Self-pay | Admitting: Occupational Therapy

## 2018-09-30 ENCOUNTER — Ambulatory Visit: Payer: Medicare Other | Admitting: Physical Therapy

## 2018-09-30 ENCOUNTER — Other Ambulatory Visit: Payer: Self-pay

## 2018-09-30 ENCOUNTER — Ambulatory Visit: Payer: Medicare Other | Admitting: Occupational Therapy

## 2018-09-30 DIAGNOSIS — M6281 Muscle weakness (generalized): Secondary | ICD-10-CM

## 2018-09-30 DIAGNOSIS — R278 Other lack of coordination: Secondary | ICD-10-CM

## 2018-09-30 DIAGNOSIS — R262 Difficulty in walking, not elsewhere classified: Secondary | ICD-10-CM

## 2018-09-30 NOTE — Therapy (Signed)
[place] MAIN [facility] SERVICES [address] [place], [place], [address] Phone: [phone]   Fax:  [phone]  Physical Therapy Treatment  Patient Details  Name: Kenneth Summers MRN: [id number] Date of Birth: 1945/08/30 Referring Provider (PT): Dr. [name], Guy Begin   Encounter Date: 09/30/2018  PT End of Session - 09/30/18 1256    Visit Number  43    Number of Visits  43    Date for PT Re-Evaluation  12/04/18    Authorization Type  Medicare reporting period starting 09/11/18    Authorization Time Period  Current cert period 02/13/8414-60/63/01    PT Start Time  1300    PT Stop Time  1345    PT Time Calculation (min)  45 min    Equipment Utilized During Treatment  Gait belt    Activity Tolerance  Patient limited by fatigue    Behavior During Therapy  [facility] for tasks assessed/performed       Past Medical History:  Diagnosis Date  . Alcohol abuse   . APS (antiphospholipid syndrome) ([place])   . BPH (benign prostatic hyperplasia)   . CAD (coronary artery disease)   . Cellulitis   . Cervical spinal stenosis   . Chronic osteomyelitis ([place])   . CKD (chronic kidney disease)   . CKD (chronic kidney disease)   . Clostridium difficile diarrhea   . Depression   . Diplopia   . Fatigue   . GERD (gastroesophageal reflux disease)   . Hyperlipemia   . Hypertension   . Hypovitaminosis D   . IBS (irritable bowel syndrome)   . IgA deficiency ([place])   . Insomnia   . Left lumbosacral radiculopathy   . Moderate obstructive sleep apnea   . Osteomyelitis of foot ([place])   . PAD (peripheral artery disease) ([place])   . Pruritus   . RA (rheumatoid arthritis) ([place])   . Radiculopathy   . Restless legs syndrome   . RLS (restless legs syndrome)   . SLE (systemic lupus erythematosus related syndrome) ([place])   . Stroke ([place])   . Toxic maculopathy of both eyes     Past Surgical History:  Procedure Laterality Date  . CARDIAC CATHETERIZATION    . CAROTID  ENDARTERECTOMY    . CERVICAL LAMINECTOMY    . CORONARY ANGIOPLASTY    . FRACTURE SURGERY    . fusion C5-6-7    . HERNIA REPAIR    . KNEE ARTHROSCOPY    . TONSILLECTOMY      There were no vitals filed for this visit.  Subjective Assessment - 09/30/18 1304    Subjective  Patient reports feeling okay today; He reports feeling fearful of falling when bending forward but has been working on his HEP; He denies any new falls;    Patient is accompained by:  Family member    Pertinent History  Patient is a 73 y.o. male who presents to outpatient physical therapy with a referral for medical diagnosis of CVA. This patient's chief complaints consist of left hemiplegia and overall deconditioning leading to the following functional deficits: dependent for ADLs, IADLs, unable to transfer to car, dependent transfers at home with hoyer lift, unable to walk or stand without significant assistance, difficulty with W/C navigation..    Limitations  Sitting;Lifting;Standing;House hold activities;Writing;Walking;Other (comment)    Diagnostic tests  Brain MRI 11/01/2017: IMPRESSION: 1. Acute right ACA territory infarct as demonstrated on prior CT imaging. No intracranial hemorrhage or significant mass effect. 2. 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    Patient Stated Goals  wants to be able to walk again    Currently in Pain?  No/denies    Multiple Pain Sites  No         TREATMENT:  Transferred wheelchair<>mat table mod A +2; patient required increased cues for proper positioning and increased time to prepare to transfer;   Transitioned to supine, mod A +2 with assistance for lifting LLE and cues for proper positioning;  Rolled to prone mod A +2, required increased time and mod VCs for UE positioning and assistance for LE positioning;  Transitioned to qped mod A +2 with pball under stomach  Cues for erect posture, weight bearing through UE and increase upper trunk  control x2-3 min; with prolonged position, patient often collapses over pball without weight bearing in UE; however with increased instruction he was   Transitioned  qped with pball to tall kneeling with pball x1 min with mod A +2 for positioning; patient unable to achieve full erect posture but was able to initiate gluteal activation for improved hip extension. He does require mod A +2 for balance and trunk control;   Transitioned to sitting edge of mat table, unsupported x2-3 min with good posture and upper trunk control; patient able to exhibit improved trunk control without lateral or posterior loss of balance being able to hold erect posture with distant supervision;   Transferred sit<>Stand with RW x3 reps with mod A +2 with cues for forward weight shift and to increase push through BLE; patient able to come up into standing but does have difficulty achieving hip extension with forward weight shift; patient able to stand for 10-15 sec, progressing to 30 sec for 1 trial; He does have difficulty extending LLE and required cues for erect head/posture to activate LE extension;   Transferred mat table to wheelchair mod a +2 with increased time required and cues for weight shift for proper positioning;    Response to treatment: patient tolerated well. He does report increased fatigue at end of session. However he does exhibit improved sitting posture and positioning following qped positioning. Pt's left knee did have a small cut after qped (possible skin tear), bandaid applied. He denies any discomfort or pain;                        PT Education - 09/30/18 1255    Education Details  transfers/exercise; HEP reinforced;    Person(s) Educated  Patient    Methods  Explanation;Verbal cues    Comprehension  Verbalized understanding;Returned demonstration;Verbal cues required;Need further instruction       PT Short Term Goals - 09/11/18 1704      PT SHORT TERM GOAL #1   Title   Be independent with initial home exercise program for self-management of symptoms.    Baseline  HEP to be given at second session; advice to practice unsupported sitting at home (06/19/2018); 07/08/18: Performing at home, 6/29: needs assistance but is doing exercise program regularly; 08/22/18: adherent;    Time  2    Period  Weeks    Status  Achieved    Target Date  06/26/18        PT Long Term Goals - 09/11/18 1704      PT LONG TERM GOAL #1   Title  Patient will complete all bed mobility with min A to improve functional independence for getting in and out of  bed and adjusting in bed.     Baseline  max A - total A reported by family (06/19/2018); 07/08/18: still requires maxA, dtr reports improvement in rolling since starting therapy; 6/23: min A to supervision in clinic; not doing at home right; 8/5: Pt's daughter states that his rolling has improved, but still has difficulty with all other bed mobility    Time  12    Period  Weeks    Status  Partially Met    Target Date  12/04/18      PT LONG TERM GOAL #2   Title  Patient will complete sit <> stand transfer chair to chair with Min A and LRAD to improve functional independence for household and community mobility.     Baseline  max A +2 STS at W/C (06/12/2018); max A +2-3 STS or stand pivot transfer, able to complete more reps (06/19/2018); 07/08/18: not safe to attempt at this time; 09/11/18: requires maxA+1 or modA+2 for STSs and squat pivot transfer    Time  12    Period  Weeks    Status  On-going    Target Date  12/04/18      PT LONG TERM GOAL #3   Title  Patient will navigate manual w/c with min A x 100 feet to improve mobility for household and short community distances.     Baseline  required total A (06/12/2018); able to roll 20 feet with min A (06/20/2018); 07/08/18: Pt can go approximately 10' without assist per family;    Time  12    Period  Weeks    Status  On-going    Target Date  12/04/18      PT LONG TERM GOAL #4   Title  Patient  will complete car transfers W/C to family SUV with min A using LRAD to improve his abilty to participate in community activities.     Baseline  unable to complete car transfers at all (06/12/2018); unable to complete car transfers at all (06/19/2018).; 07/08/18: Unable to perform at this time; 09/11/18: unable to perform at this time    Time  12    Period  Weeks    Status  On-going    Target Date  12/04/18      PT LONG TERM GOAL #5   Title  Patient will ambulate at least 150 feet with mod I using LRAD to improve mobility for household and community distances.     Baseline  unable to take steps (06/12/2018); able to shift R leg forward and back with max A and RW at edge of plinth (06/19/2018); 07/08/18: unable to ambulate at this time. 09/11/18: unable to perform at this time    Time  12    Period  Weeks    Status  On-going    Target Date  12/04/18      Additional Long Term Goals   Additional Long Term Goals  Yes      PT LONG TERM GOAL #6   Title  Patient will navigate 4 steps with min A and BUE support to improve his ability to access community and social participation.     Baseline  unable to complet stairs (06/12/2018); unable to complete stairs (06/19/2018); 07/08/18: unable to attempt at this time; 09/11/18: unsafe to attempt at this time    Time  12    Period  Weeks    Status  On-going    Target Date  12/04/18      PT LONG  TERM GOAL #7   Title  Patient will increase functional reach test to >15 inches in sitting to exhibit improved sitting balance and positioning and reduce fall risk;    Baseline  07/30/18: 12 inches; 09/11/18: 10-12 inches    Time  4    Period  Weeks    Status  Partially Met    Target Date  12/04/18         Plan - 10/02/18 0815    Clinical Impression Statement  Patient motivated this session; Progressed mobility exercise with qped position to challenge UE/LE strengthening and trunk control. Following Qped, patient able to sit edge of mat table with better positioning and trunk  control with less lateral/posterior trunk lean. He was able to sit edge of mat table, unsupported distant supervision with good trunk control. Instructed patient in sit<>stand transfers; patient able to exhibit forward weight shift better but continues to have difficulty achieving erect standing posture requiring mod A for safety; Patient would benefit from additional skilled PT intervention to improve strength and mobility;    Personal Factors and Comorbidities  Age;Comorbidity 3+    Comorbidities  Relevant past medical history and comorbidities include long term steroid use for lupus, CKD, chronic osteomyelitis, cervical spine stenosis, BPH, APS, alcohol abuse, peripheral artery disease, depression, GERD, obstructive sleep apnea, HTN, IBS, IgA deficiency, lumbar radiculopathy, RA, Stroke, toxic maculopathy in both eyes, systemic lupus erythematosus related syndrome, cardiac catheterization, carotid endarterectomy, cervical laminectomy, coronary angioplasty, Fusion C5-C7, knee arthroplasty, lumbar surgery.    Examination-Activity Limitations  Bed Mobility;Bend;Sit;Toileting;Stand;Stairs;Lift;Transfers;Squat;Locomotion Level;Carry;Dressing;Hygiene/Grooming;Continence    Examination-Participation Restrictions  Yard Work;Interpersonal Relationship;Community Activity    Stability/Clinical Decision Making  Evolving/Moderate complexity    Rehab Potential  Fair    PT Frequency  3x / week    PT Duration  12 weeks    PT Treatment/Interventions  ADLs/Self Care Home Management;Electrical Stimulation;Moist Heat;Cryotherapy;Gait training;Stair training;Functional mobility training;Therapeutic exercise;Balance training;Neuromuscular re-education;Cognitive remediation;Patient/family education;Orthotic Fit/Training;Wheelchair mobility training;Manual techniques;Passive range of motion;Energy conservation;Joint Manipulations    PT Next Visit Plan  Work on strength, sitting/standing balance, functional activities such as  rolling, bed mobility, transfers    PT Home Exercise Plan  HEP handout provided via Beth last week    Consulted and Agree with Plan of Care  Patient;Family member/caregiver    Family Member Consulted  daughter [name]             Patient will benefit from skilled therapeutic intervention in order to improve the following deficits and impairments:     Visit Diagnosis: Muscle weakness (generalized)  Other lack of coordination  Difficulty in walking, not elsewhere classified     Problem List There are no active problems to display for this patient.   Trotter,Margaret PT, DPT 09/30/2018, 1:06 PM  [place] MAIN [facility] SERVICES [address] [place], [place], [address] Phone: [phone]   Fax:  [phone]  Name: Kenneth Summers MRN: [id number] Date of Birth: 10/20/1945

## 2018-09-30 NOTE — Therapy (Signed)
[place] MAIN [facility] SERVICES [address] [place], [place], [address] Phone: [phone]   Fax:  [phone]  Occupational Therapy Treatment  Patient Details  Name: Kenneth Summers MRN: [id number] Date of Birth: [birth year] No data recorded  Encounter Date: 09/30/2018  OT End of Session - 09/30/18 1447    Visit Number  42    Number of Visits  72    Date for OT Re-Evaluation  11/27/18    Authorization Type  Progress report period starting 09/26/2018    OT Start Time  1353    OT Stop Time  1425    OT Time Calculation (min)  32 min    Activity Tolerance  Patient tolerated treatment well    Behavior During Therapy  [facility] for tasks assessed/performed       Past Medical History:  Diagnosis Date  . Alcohol abuse   . APS (antiphospholipid syndrome) ([place])   . BPH (benign prostatic hyperplasia)   . CAD (coronary artery disease)   . Cellulitis   . Cervical spinal stenosis   . Chronic osteomyelitis ([place])   . CKD (chronic kidney disease)   . CKD (chronic kidney disease)   . Clostridium difficile diarrhea   . Depression   . Diplopia   . Fatigue   . GERD (gastroesophageal reflux disease)   . Hyperlipemia   . Hypertension   . Hypovitaminosis D   . IBS (irritable bowel syndrome)   . 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 Insomnia   . Left lumbosacral radiculopathy   . Moderate obstructive sleep apnea   . Osteomyelitis of foot ([place])   . PAD (peripheral artery disease) ([place])   . Pruritus   . RA (rheumatoid arthritis) ([place])   . Radiculopathy   . Restless legs syndrome   . RLS (restless legs syndrome)   . SLE (systemic lupus erythematosus related syndrome) ([place])   . Stroke ([place])   . Toxic maculopathy of both eyes     Past Surgical History:  Procedure Laterality Date  . CARDIAC CATHETERIZATION    . CAROTID ENDARTERECTOMY    . CERVICAL LAMINECTOMY    . CORONARY ANGIOPLASTY    . FRACTURE SURGERY    . fusion C5-6-7    . HERNIA REPAIR    . KNEE  ARTHROSCOPY    . TONSILLECTOMY      There were no vitals filed for this visit.  Subjective Assessment - 09/30/18 1446    Subjective   Pt. reports he plans to rest this weekend    Patient is accompanied by:  Family member    Pertinent History  Pt. is a 73 y.o. 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 Pt. PMHx includes: Multiple Falls, Lupus, DJD, Renal Abscess, CVA. Pt. resides with his wife. Pt.'s wife and daughter assist with ADLs. Pt. has caregivers in  for 2 hours a day, 6 days a week. Pt. received Rehab services in acute care, at SNF for STR, and [facility]. Pt. is retired from [employer] for [employer] and [employer].    Currently in Pain?  No/denies      OT TREATMENT    Selfcare:  Motor Free Visual Perceptual Test was administered as a screening tool to determine how visual perceptual skills may be affecting ADL, and IADL functioning.  Pt. Scored 12/36. Pt. Missed 1 item in the similar category,  2 items in figure ground, 3 items in visual discrimination, 3 items  in visual memory, 3 items in visual closure, and 1 item in visual discrimination different objects.  Pt. required cues for redirection, and rest breaks with position changes.                       OT Education - 09/30/18 1447    Education Details  LUE functioning    Person(s) Educated  Patient    Methods  Explanation;Demonstration    Comprehension  Verbalized understanding;Returned demonstration;Verbal cues required          OT Long Term Goals - 09/25/18 1719      OT LONG TERM GOAL #1   Title  Pt. will increase left shoulder flexion AROM by 10 degrees to access cabinet/shelf.    Baseline  AROM left shoulder flexion: 108    Time  12    Period  Weeks    Status  On-going    Target Date  11/27/18      OT LONG TERM GOAL #2   Title  Pt. will donn a shirt with Supervision.    Baseline  Min-ModA    Time  12    Period  Weeks    Status  On-going     Target Date  11/27/18      OT LONG TERM GOAL #3   Title  Pt. will require ModA to perform LE dressing    Baseline  MaxA left, ModA with the right    Time  12    Period  Weeks    Status  On-going    Target Date  11/27/18      OT LONG TERM GOAL #4   Title  Pt. will improve left grip strength by 10# to be able to open a jar/container.    Baseline  Left: 30, Right 40    Time  12    Period  Weeks    Status  On-going    Target Date  11/27/18      OT LONG TERM GOAL #5   Title  Pt. will improve left hand [facility] skills to be able to assit with buttoning/zipping.    Baseline  Left: 1 min & 50 sec. R: 56 sec.    Time  12    Period  Weeks    Status  New    Target Date  11/27/18      OT LONG TERM GOAL #6   Title  Pt. will demonstrate visual conmpensatory strategies during 100% of the time during ADLs    Baseline  Pt. requires min-mod for left side awareness for tabletop tasks in his near space, Max cues in the extrapersonal space    Time  12    Period  Weeks    Status  On-going    Target Date  11/27/18      OT LONG TERM GOAL #7   Title  Pt. will prepare a simple cold snack from w/c with supervision using cognitive compensatory strategies 100% of the time.    Baseline  Pt. is unable    Time  12    Period  Weeks    Status  On-going    Target Date  11/27/18      OT LONG TERM GOAL #8   Title  Pt. will accurately identify potential safety hazard using good safety awareness, and judgement 100% for ADLs, and IADLs.    Baseline  limited    Time  12    Period  Weeks  Status  On-going    Target Date  11/27/18      OT LONG TERM GOAL  #9   TITLE  Pt. will  navigate his w/c around obstacles with Supervision and 100% accuracy    Baseline  Pt. requires maxA, and heavy cuing for obstacle on the left    Time  12    Period  Weeks    Status  On-going    Target Date  11/27/18            Plan - 09/30/18 1448    Clinical Impression Statement  Pt. continues to present with limited  attention to tasks, improving left sided awareness, visual scanning skills to the left. and limited LUE functioning. Pt. continues to be motivated to work on these skills in order to improve overall ADL, and IADL functioning, as well as promote independence.    OT Occupational Profile and History  Problem Focused Assessment - Including review of records relating to presenting problem    Occupational performance deficits (Please refer to evaluation for details):  ADL's    Body Structure / Function / Physical Skills  UE functional use;Coordination;FMC;Dexterity;Strength;ROM    Cognitive Skills  Attention;Memory;Emotional;Problem Solve;Safety Awareness    Rehab Potential  Good    Clinical Decision Making  Several treatment options, min-mod task modification necessary    Comorbidities Affecting Occupational Performance:  Presence of comorbidities impacting occupational performance    Comorbidities impacting occupational performance description:  Phyical, cognitive, visual,  medical comorbidities    Modification or Assistance to Complete Evaluation   Min-Moderate modification of tasks or assist with assess necessary to complete eval    OT Frequency  3x / week    OT Duration  12 weeks    OT Treatment/Interventions  Self-care/ADL training;DME and/or AE instruction;Therapeutic exercise;Therapeutic activities;Moist Heat;Cognitive remediation/compensation;Neuromuscular education;Visual/perceptual remediation/compensation;Coping strategies training;Patient/family education;Passive range of motion;Psychosocial skills training;Energy conservation;Functional Mobility Training    Consulted and Agree with Plan of Care  Patient    Family Member Consulted  Daughter [name]       Patient will benefit from skilled therapeutic intervention in order to improve the following deficits and impairments:   Body Structure / Function / Physical Skills: UE functional use, Coordination, FMC, Dexterity, Strength, ROM Cognitive  Skills: Attention, Memory, Emotional, Problem Solve, Safety Awareness     Visit Diagnosis: Muscle weakness (generalized)  Other lack of coordination    Problem List There are no active problems to display for this patient.   Harrel Carina, MS, OTR/L 09/30/2018, 5:17 PM  [place] MAIN [facility] SERVICES [address] [place], [place], [address] Phone: [phone]   Fax:  [phone]  Name: Kenneth Summers MRN: [id number] Date of Birth: 1945-02-27

## 2018-10-02 ENCOUNTER — Other Ambulatory Visit: Payer: Self-pay

## 2018-10-02 ENCOUNTER — Ambulatory Visit: Payer: Medicare Other | Admitting: Physical Therapy

## 2018-10-02 ENCOUNTER — Encounter: Payer: Self-pay | Admitting: Physical Therapy

## 2018-10-02 ENCOUNTER — Encounter: Payer: Self-pay | Admitting: Occupational Therapy

## 2018-10-02 ENCOUNTER — Ambulatory Visit: Payer: Medicare Other | Admitting: Occupational Therapy

## 2018-10-02 DIAGNOSIS — M6281 Muscle weakness (generalized): Secondary | ICD-10-CM

## 2018-10-02 DIAGNOSIS — R278 Other lack of coordination: Secondary | ICD-10-CM | POA: Diagnosis not present

## 2018-10-02 DIAGNOSIS — R262 Difficulty in walking, not elsewhere classified: Secondary | ICD-10-CM

## 2018-10-02 NOTE — Therapy (Signed)
[place] MAIN [facility] SERVICES [address] [place], [place], [address] Phone: [phone]   Fax:  [phone]  Physical Therapy Treatment  Patient Details  Name: Kenneth Summers MRN: [id number] Date of Birth: 06/19/1945 Referring Provider (PT): Dr. [name], Guy Begin   Encounter Date: 10/02/2018  PT End of Session - 10/02/18 1520    Visit Number  44    Number of Visits  53    Date for PT Re-Evaluation  12/04/18    Authorization Type  Medicare reporting period starting 09/11/18    Authorization Time Period  Current cert period 03/18/4763-46/50/35    PT Start Time  1301    PT Stop Time  1345    PT Time Calculation (min)  44 min    Equipment Utilized During Treatment  Gait belt    Activity Tolerance  Patient limited by fatigue    Behavior During Therapy  [facility] for tasks assessed/performed       Past Medical History:  Diagnosis Date  . Alcohol abuse   . APS (antiphospholipid syndrome) ([place])   . BPH (benign prostatic hyperplasia)   . CAD (coronary artery disease)   . Cellulitis   . Cervical spinal stenosis   . Chronic osteomyelitis ([place])   . CKD (chronic kidney disease)   . CKD (chronic kidney disease)   . Clostridium difficile diarrhea   . Depression   . Diplopia   . Fatigue   . GERD (gastroesophageal reflux disease)   . Hyperlipemia   . Hypertension   . Hypovitaminosis D   . IBS (irritable bowel syndrome)   . IgA deficiency ([place])   . Insomnia   . Left lumbosacral radiculopathy   . Moderate obstructive sleep apnea   . Osteomyelitis of foot ([place])   . PAD (peripheral artery disease) ([place])   . Pruritus   . RA (rheumatoid arthritis) ([place])   . Radiculopathy   . Restless legs syndrome   . RLS (restless legs syndrome)   . SLE (systemic lupus erythematosus related syndrome) ([place])   . Stroke ([place])   . Toxic maculopathy of both eyes     Past Surgical History:  Procedure Laterality Date  . CARDIAC CATHETERIZATION    . CAROTID  ENDARTERECTOMY    . CERVICAL LAMINECTOMY    . CORONARY ANGIOPLASTY    . FRACTURE SURGERY    . fusion C5-6-7    . HERNIA REPAIR    . KNEE ARTHROSCOPY    . TONSILLECTOMY      There were no vitals filed for this visit.  Subjective Assessment - 10/02/18 1519    Subjective  Patient reports feeling well; denies any pain; Reports some soreness after last session which only lasted that day and then the following day he reports having a very good day with good alertness and good HEP adherence;    Patient is accompained by:  Family member    Pertinent History  Patient is a 73 y.o. male who presents to outpatient physical therapy with a referral for medical diagnosis of CVA. This patient's chief complaints consist of left hemiplegia and overall deconditioning leading to the following functional deficits: dependent for ADLs, IADLs, unable to transfer to car, dependent transfers at home with hoyer lift, unable to walk or stand without significant assistance, difficulty with W/C navigation..    Limitations  Sitting;Lifting;Standing;House hold activities;Writing;Walking;Other (comment)    Diagnostic tests  Brain MRI 11/01/2017: IMPRESSION: 1. Acute right ACA territory infarct as demonstrated on prior CT  imaging. No intracranial hemorrhage or significant mass effect. 2. 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    Patient Stated Goals  wants to be able to walk again    Currently in Pain?  No/denies    Multiple Pain Sites  No         TREATMENT:  Transferred wheelchair<>mat table mod A +2; patient required increased cues for proper positioning and increased time to prepare to transfer;   Transitioned to supine, min A +2 with assistance for lifting LLE and cues for proper positioning;  Rolled to prone mod A +2, required increased time and mod VCs for UE positioning and assistance for LE positioning;  Transitioned to qped mod A +2 with pball under stomach              Cues for erect posture, weight bearing through UE and increase upper trunk control x2-3 min; with prolonged position, patient often collapses over pball without weight bearing in UE; however with increased instruction he was able to increase push through UE for better qped position and improved trunk control; able to hold qped position with close supervision/CGA for 20 sec with good trunk control and less loss of balance;   Qped over pball: forward/backward weight shift x5 reps with min A +2 for balance and mod VCs for proper positioning and trunk activation;    Transitioned to sitting edge of mat table, unsupported x2-3 min with good posture and upper trunk control; patient able to exhibit improved trunk control without lateral or posterior loss of balance being able to hold erect posture with distant supervision;   Transferred sit<>Stand with RW x2 reps with mod A +2 with cues for forward weight shift and to increase push through BLE; patient able to come up into standing with improved forward weight shift and better trunk position this session; Able to hold standing position for 1 min 40 sec, and 40 sec each rep;  Transferred mat table to wheelchair mod A +2 with increased time required and cues for weight shift for proper positioning;    Response to treatment: patient tolerated well. He does report increased fatigue at end of session. However he does exhibit improved sitting posture and positioning following qped positioning.He denies any discomfort or pain; He does require increased assistance for positioning in chair at end of session due to fatigue. Patient able to exhibit improved trunk control and weight shift following qped activity;                                 PT Education - 10/02/18 1520    Education Details  transfers/positioning;    Person(s) Educated  Patient    Methods  Explanation;Verbal cues    Comprehension  Verbalized  understanding;Returned demonstration;Verbal cues required;Need further instruction       PT Short Term Goals - 09/11/18 1704      PT SHORT TERM GOAL #1   Title  Be independent with initial home exercise program for self-management of symptoms.    Baseline  HEP to be given at second session; advice to practice unsupported sitting at home (06/19/2018); 07/08/18: Performing at home, 6/29: needs assistance but is doing exercise program regularly; 08/22/18: adherent;    Time  2    Period  Weeks    Status  Achieved    Target Date  06/26/18        PT Long Term  Goals - 09/11/18 1704      PT LONG TERM GOAL #1   Title  Patient will complete all bed mobility with min A to improve functional independence for getting in and out of bed and adjusting in bed.     Baseline  max A - total A reported by family (06/19/2018); 07/08/18: still requires maxA, dtr reports improvement in rolling since starting therapy; 6/23: min A to supervision in clinic; not doing at home right; 8/5: Pt's daughter states that his rolling has improved, but still has difficulty with all other bed mobility    Time  12    Period  Weeks    Status  Partially Met    Target Date  12/04/18      PT LONG TERM GOAL #2   Title  Patient will complete sit <> stand transfer chair to chair with Min A and LRAD to improve functional independence for household and community mobility.     Baseline  max A +2 STS at W/C (06/12/2018); max A +2-3 STS or stand pivot transfer, able to complete more reps (06/19/2018); 07/08/18: not safe to attempt at this time; 09/11/18: requires maxA+1 or modA+2 for STSs and squat pivot transfer    Time  12    Period  Weeks    Status  On-going    Target Date  12/04/18      PT LONG TERM GOAL #3   Title  Patient will navigate manual w/c with min A x 100 feet to improve mobility for household and short community distances.     Baseline  required total A (06/12/2018); able to roll 20 feet with min A (06/20/2018); 07/08/18: Pt can go  approximately 10' without assist per family;    Time  12    Period  Weeks    Status  On-going    Target Date  12/04/18      PT LONG TERM GOAL #4   Title  Patient will complete car transfers W/C to family SUV with min A using LRAD to improve his abilty to participate in community activities.     Baseline  unable to complete car transfers at all (06/12/2018); unable to complete car transfers at all (06/19/2018).; 07/08/18: Unable to perform at this time; 09/11/18: unable to perform at this time    Time  12    Period  Weeks    Status  On-going    Target Date  12/04/18      PT LONG TERM GOAL #5   Title  Patient will ambulate at least 150 feet with mod I using LRAD to improve mobility for household and community distances.     Baseline  unable to take steps (06/12/2018); able to shift R leg forward and back with max A and RW at edge of plinth (06/19/2018); 07/08/18: unable to ambulate at this time. 09/11/18: unable to perform at this time    Time  12    Period  Weeks    Status  On-going    Target Date  12/04/18      Additional Long Term Goals   Additional Long Term Goals  Yes      PT LONG TERM GOAL #6   Title  Patient will navigate 4 steps with min A and BUE support to improve his ability to access community and social participation.     Baseline  unable to complet stairs (06/12/2018); unable to complete stairs (06/19/2018); 07/08/18: unable to attempt at this time; 09/11/18: unsafe  to attempt at this time    Time  12    Period  Weeks    Status  On-going    Target Date  12/04/18      PT LONG TERM GOAL #7   Title  Patient will increase functional reach test to >15 inches in sitting to exhibit improved sitting balance and positioning and reduce fall risk;    Baseline  07/30/18: 12 inches; 09/11/18: 10-12 inches    Time  4    Period  Weeks    Status  Partially Met    Target Date  12/04/18            Plan - 10/02/18 1521    Clinical Impression Statement  Patient tolerated session well; He is  progressing well with qped positioning. he did require increased cues and time to achieve qped position but was able to progress exercise with increased weight shift. Patient able to stand better this session with improved posture and better forward weight shift. He does continue to fatigue with advanced exercise. He would benefit from additional skilled PT intervention to improve strength and mobility;    Personal Factors and Comorbidities  Age;Comorbidity 3+    Comorbidities  Relevant past medical history and comorbidities include long term steroid use for lupus, CKD, chronic osteomyelitis, cervical spine stenosis, BPH, APS, alcohol abuse, peripheral artery disease, depression, GERD, obstructive sleep apnea, HTN, IBS, IgA deficiency, lumbar radiculopathy, RA, Stroke, toxic maculopathy in both eyes, systemic lupus erythematosus related syndrome, cardiac catheterization, carotid endarterectomy, cervical laminectomy, coronary angioplasty, Fusion C5-C7, knee arthroplasty, lumbar surgery.    Examination-Activity Limitations  Bed Mobility;Bend;Sit;Toileting;Stand;Stairs;Lift;Transfers;Squat;Locomotion Level;Carry;Dressing;Hygiene/Grooming;Continence    Examination-Participation Restrictions  Yard Work;Interpersonal Relationship;Community Activity    Stability/Clinical Decision Making  Evolving/Moderate complexity    Rehab Potential  Fair    PT Frequency  3x / week    PT Duration  12 weeks    PT Treatment/Interventions  ADLs/Self Care Home Management;Electrical Stimulation;Moist Heat;Cryotherapy;Gait training;Stair training;Functional mobility training;Therapeutic exercise;Balance training;Neuromuscular re-education;Cognitive remediation;Patient/family education;Orthotic Fit/Training;Wheelchair mobility training;Manual techniques;Passive range of motion;Energy conservation;Joint Manipulations    PT Next Visit Plan  Work on strength, sitting/standing balance, functional activities such as rolling, bed mobility,  transfers    PT Home Exercise Plan  HEP handout provided via [facility] last week    Consulted and Agree with Plan of Care  Patient;Family member/caregiver    Family Member Consulted  daughter [name]       Patient will benefit from skilled therapeutic intervention in order to improve the following deficits and impairments:  Abnormal gait, Decreased activity tolerance, Decreased cognition, Decreased endurance, Decreased knowledge of use of DME, Decreased range of motion, Decreased skin integrity, Decreased strength, Impaired perceived functional ability, Impaired sensation, Impaired UE functional use, Improper body mechanics, Pain, Cardiopulmonary status limiting activity, Decreased balance, Decreased coordination, Decreased mobility, Difficulty walking, Impaired tone, Postural dysfunction  Visit Diagnosis: Muscle weakness (generalized)  Other lack of coordination  Difficulty in walking, not elsewhere classified     Problem List There are no active problems to display for this patient.   , PT, DPT 10/02/2018, 3:22 PM  [place] MAIN [facility] SERVICES [address] [place], [place], [address] Phone: [phone]   Fax:  [phone]  Name: Kenneth Summers MRN: [id number] Date of Birth: 1945-06-23

## 2018-10-02 NOTE — Therapy (Signed)
[place] MAIN [facility] SERVICES [address] [place], [place], [address] Phone: [phone]   Fax:  [phone]  Occupational Therapy Treatment  Patient Details  Name: Kenneth Summers MRN: [id number] Date of Birth: 1945-05-09 No data recorded  Encounter Date: 10/02/2018  OT End of Session - 10/02/18 1814    Visit Number  43    Number of Visits  61    Date for OT Re-Evaluation  11/27/18    Authorization Type  Progress report period starting 09/26/2018    OT Start Time  1351    OT Stop Time  1430    OT Time Calculation (min)  39 min    Activity Tolerance  Patient tolerated treatment well    Behavior During Therapy  [facility] for tasks assessed/performed       Past Medical History:  Diagnosis Date  . Alcohol abuse   . APS (antiphospholipid syndrome) ([place])   . BPH (benign prostatic hyperplasia)   . CAD (coronary artery disease)   . Cellulitis   . Cervical spinal stenosis   . Chronic osteomyelitis ([place])   . CKD (chronic kidney disease)   . CKD (chronic kidney disease)   . Clostridium difficile diarrhea   . Depression   . Diplopia   . Fatigue   . GERD (gastroesophageal reflux disease)   . Hyperlipemia   . Hypertension   . Hypovitaminosis D   . IBS (irritable bowel syndrome)   . 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 Insomnia   . Left lumbosacral radiculopathy   . Moderate obstructive sleep apnea   . Osteomyelitis of foot ([place])   . PAD (peripheral artery disease) ([place])   . Pruritus   . RA (rheumatoid arthritis) ([place])   . Radiculopathy   . Restless legs syndrome   . RLS (restless legs syndrome)   . SLE (systemic lupus erythematosus related syndrome) ([place])   . Stroke ([place])   . Toxic maculopathy of both eyes     Past Surgical History:  Procedure Laterality Date  . CARDIAC CATHETERIZATION    . CAROTID ENDARTERECTOMY    . CERVICAL LAMINECTOMY    . CORONARY ANGIOPLASTY    . FRACTURE SURGERY    . fusion C5-6-7    . HERNIA REPAIR    . KNEE  ARTHROSCOPY    . TONSILLECTOMY      There were no vitals filed for this visit.  Subjective Assessment - 10/02/18 1814    Subjective   No reports of pain today    Patient is accompanied by:  Family member    Pertinent History  Pt. is a 73 y.o. 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 Pt. PMHx includes: Multiple Falls, Lupus, DJD, Renal Abscess, CVA. Pt. resides with his wife. Pt.'s wife and daughter assist with ADLs. Pt. has caregivers in  for 2 hours a day, 6 days a week. Pt. received Rehab services in acute care, at SNF for STR, and [facility] services. Pt. is retired from [employer] for [employer] and [employer].    Currently in Pain?  No/denies      OT TREATMENT    Therapeutic Activities:  Pt. Worked on using bilateral UE  coordination to build a PCP Pipe tower following design patterns of simple, and moderate complexity. Pt. Required cues to engage his LUE during reaching to the left to retrieve the pieces from a shallow container. Fewer cues were required to use both hands together  to connect the pieces. Pt. Was able to complete simple pattern, however required increased time to complete the pattern of moderate complexity and did not complete the design during the session.                       OT Education - 10/02/18 1814    Education Details  LUE functioning    Person(s) Educated  Patient    Methods  Explanation;Demonstration    Comprehension  Verbalized understanding;Returned demonstration;Verbal cues required          OT Long Term Goals - 09/25/18 1719      OT LONG TERM GOAL #1   Title  Pt. will increase left shoulder flexion AROM by 10 degrees to access cabinet/shelf.    Baseline  AROM left shoulder flexion: 108    Time  12    Period  Weeks    Status  On-going    Target Date  11/27/18      OT LONG TERM GOAL #2   Title  Pt. will donn a shirt with Supervision.    Baseline  Min-ModA    Time  12    Period   Weeks    Status  On-going    Target Date  11/27/18      OT LONG TERM GOAL #3   Title  Pt. will require ModA to perform LE dressing    Baseline  MaxA left, ModA with the right    Time  12    Period  Weeks    Status  On-going    Target Date  11/27/18      OT LONG TERM GOAL #4   Title  Pt. will improve left grip strength by 10# to be able to open a jar/container.    Baseline  Left: 30, Right 40    Time  12    Period  Weeks    Status  On-going    Target Date  11/27/18      OT LONG TERM GOAL #5   Title  Pt. will improve left hand [facility] skills to be able to assit with buttoning/zipping.    Baseline  Left: 1 min & 50 sec. R: 56 sec.    Time  12    Period  Weeks    Status  New    Target Date  11/27/18      OT LONG TERM GOAL #6   Title  Pt. will demonstrate visual conmpensatory strategies during 100% of the time during ADLs    Baseline  Pt. requires min-mod for left side awareness for tabletop tasks in his near space, Max cues in the extrapersonal space    Time  12    Period  Weeks    Status  On-going    Target Date  11/27/18      OT LONG TERM GOAL #7   Title  Pt. will prepare a simple cold snack from w/c with supervision using cognitive compensatory strategies 100% of the time.    Baseline  Pt. is unable    Time  12    Period  Weeks    Status  On-going    Target Date  11/27/18      OT LONG TERM GOAL #8   Title  Pt. will accurately identify potential safety hazard using good safety awareness, and judgement 100% for ADLs, and IADLs.    Baseline  limited    Time  12    Period  Weeks    Status  On-going    Target Date  11/27/18      OT LONG TERM GOAL  #9   TITLE  Pt. will  navigate his w/c around obstacles with Supervision and 100% accuracy    Baseline  Pt. requires maxA, and heavy cuing for obstacle on the left    Time  12    Period  Weeks    Status  On-going    Target Date  11/27/18            Plan - 10/02/18 1816    Clinical Impression Statement  Pt. is making  progress with attention during tasks however requires occasional cues for redirection, Pt. conitnues to require cues for left sided awareness, and to engage the LUE during tasks as pt. attempts to initiate and reach with the RUE. Pt. continues to work on improving LUE functioning during ADLs, and IADL tasks.    OT Occupational Profile and History  Problem Focused Assessment - Including review of records relating to presenting problem    Occupational performance deficits (Please refer to evaluation for details):  ADL's    Body Structure / Function / Physical Skills  UE functional use;Coordination;FMC;Dexterity;Strength;ROM    Rehab Potential  Good    Clinical Decision Making  Several treatment options, min-mod task modification necessary    Comorbidities Affecting Occupational Performance:  Presence of comorbidities impacting occupational performance    Comorbidities impacting occupational performance description:  Phyical, cognitive, visual,  medical comorbidities    Modification or Assistance to Complete Evaluation   Min-Moderate modification of tasks or assist with assess necessary to complete eval    OT Frequency  3x / week    OT Duration  12 weeks    OT Treatment/Interventions  Self-care/ADL training;DME and/or AE instruction;Therapeutic exercise;Therapeutic activities;Moist Heat;Cognitive remediation/compensation;Neuromuscular education;Visual/perceptual remediation/compensation;Coping strategies training;Patient/family education;Passive range of motion;Psychosocial skills training;Energy conservation;Functional Mobility Training    Consulted and Agree with Plan of Care  Patient    Family Member Consulted  Daughter [name]       Patient will benefit from skilled therapeutic intervention in order to improve the following deficits and impairments:   Body Structure / Function / Physical Skills: UE functional use, Coordination, FMC, Dexterity, Strength, ROM       Visit Diagnosis: Muscle  weakness (generalized)  Other lack of coordination    Problem List There are no active problems to display for this patient.   Harrel Carina, MS, OTR/L 10/02/2018, 6:22 PM  [place] MAIN [facility] SERVICES [address] [place], [place], [address] Phone: [phone]   Fax:  [phone]  Name: Kenneth Summers MRN: [id number] Date of Birth: [birth year]

## 2018-10-03 ENCOUNTER — Ambulatory Visit: Payer: Medicare Other | Admitting: Occupational Therapy

## 2018-10-03 ENCOUNTER — Other Ambulatory Visit: Payer: Self-pay

## 2018-10-03 ENCOUNTER — Ambulatory Visit: Payer: Medicare Other | Admitting: Physical Therapy

## 2018-10-03 ENCOUNTER — Encounter: Payer: Self-pay | Admitting: Occupational Therapy

## 2018-10-03 ENCOUNTER — Encounter: Payer: Self-pay | Admitting: Physical Therapy

## 2018-10-03 DIAGNOSIS — R278 Other lack of coordination: Secondary | ICD-10-CM | POA: Diagnosis not present

## 2018-10-03 DIAGNOSIS — M6281 Muscle weakness (generalized): Secondary | ICD-10-CM

## 2018-10-03 DIAGNOSIS — R262 Difficulty in walking, not elsewhere classified: Secondary | ICD-10-CM

## 2018-10-03 NOTE — Therapy (Signed)
[place] MAIN [facility] SERVICES [address] [place], [place], [address] Phone: [phone]   Fax:  [phone]  Physical Therapy Treatment  Patient Details  Name: Kenneth Summers MRN: [id number] Date of Birth: [birth year] Referring Provider (PT): Dr. [name], Guy Begin   Encounter Date: 10/03/2018  PT End of Session - 10/03/18 1542    Visit Number  45    Number of Visits  32    Date for PT Re-Evaluation  12/04/18    Authorization Type  Medicare reporting period starting 09/11/18    Authorization Time Period  Current cert period 09/14/2798-34/91/79    PT Start Time  1435    PT Stop Time  1515    PT Time Calculation (min)  40 min    Equipment Utilized During Treatment  Gait belt    Activity Tolerance  Patient limited by fatigue    Behavior During Therapy  [facility] for tasks assessed/performed       Past Medical History:  Diagnosis Date  . Alcohol abuse   . APS (antiphospholipid syndrome) ([place])   . BPH (benign prostatic hyperplasia)   . CAD (coronary artery disease)   . Cellulitis   . Cervical spinal stenosis   . Chronic osteomyelitis ([place])   . CKD (chronic kidney disease)   . CKD (chronic kidney disease)   . Clostridium difficile diarrhea   . Depression   . Diplopia   . Fatigue   . GERD (gastroesophageal reflux disease)   . Hyperlipemia   . Hypertension   . Hypovitaminosis D   . IBS (irritable bowel syndrome)   . IgA deficiency ([place])   . Insomnia   . Left lumbosacral radiculopathy   . Moderate obstructive sleep apnea   . Osteomyelitis of foot ([place])   . PAD (peripheral artery disease) ([place])   . Pruritus   . RA (rheumatoid arthritis) ([place])   . Radiculopathy   . Restless legs syndrome   . RLS (restless legs syndrome)   . SLE (systemic lupus erythematosus related syndrome) ([place])   . Stroke ([place])   . Toxic maculopathy of both eyes     Past Surgical History:  Procedure Laterality Date  . CARDIAC CATHETERIZATION    . CAROTID  ENDARTERECTOMY    . CERVICAL LAMINECTOMY    . CORONARY ANGIOPLASTY    . FRACTURE SURGERY    . fusion C5-6-7    . HERNIA REPAIR    . KNEE ARTHROSCOPY    . TONSILLECTOMY      There were no vitals filed for this visit.  Subjective Assessment - 10/03/18 1541    Subjective  Patient reports increased fatigue and soreness today; He reports increased stiffness after sitting in chair for prolonged period today;    Patient is accompained by:  Family member    Pertinent History  Patient is a 73 y.o. male who presents to outpatient physical therapy with a referral for medical diagnosis of CVA. This patient's chief complaints consist of left hemiplegia and overall deconditioning leading to the following functional deficits: dependent for ADLs, IADLs, unable to transfer to car, dependent transfers at home with hoyer lift, unable to walk or stand without significant assistance, difficulty with W/C navigation..    Limitations  Sitting;Lifting;Standing;House hold activities;Writing;Walking;Other (comment)    Diagnostic tests  Brain MRI 11/01/2017: IMPRESSION: 1. Acute right ACA territory infarct as demonstrated on prior CT imaging. No intracranial hemorrhage or significant mass effect. 2. 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    Patient Stated Goals  wants to be able to walk again    Currently in Pain?  Yes    Pain Score  4     Pain Location  Leg    Pain Orientation  Left    Pain Descriptors / Indicators  Aching;Sore    Pain Type  Acute pain    Pain Onset  Today    Pain Frequency  Intermittent    Aggravating Factors   prolonged sitting    Pain Relieving Factors  exercise/activity    Effect of Pain on Daily Activities  decreased sitting tolerance;    Multiple Pain Sites  No            TREATMENT:  Transferred wheelchair<>mat table mod A +2; patient required increased cues for proper positioning and increased time to prepare to transfer;  Transitioned to  supine, min A +2with assistance for lifting LLE and cues for proper positioning; Rolled to prone mod A +2, required increased time and mod VCs for UE positioning and assistance for LE positioning; Transitioned to qped max A +3 with pball under stomach Cues for erect posture, weight bearing through UE and increase upper trunk control x2-3 min; with prolonged position, patient often collapses over pball without weight bearing in UE; however with increased instruction he was able to increase push through UE for better qped position and improved trunk control; able to hold qped position with close supervision for 15 sec with good trunk control and less loss of balance;   Qped over pball: forward/backward weight shift x5 reps with min A +2 for balance and mod VCs for proper positioning and trunk activation;   Transitioned to sitting edge of mat table, unsupported x2-3 min with good posture and upper trunk control;patient able to exhibit improved trunk control without lateral or posterior loss of balance being able to hold erect posture with distant supervision;  Transferred mat table to wheelchair mod A +2with increased time required and cues for weight shift for proper positioning;    Response to treatment: patient tolerated well. He does report increased fatigue at end of session. However he does exhibit improved sitting posture and positioning following qped positioning.He denies any discomfort or pain;He does require increased assistance for positioning in chair at end of session due to fatigue. Patient able to exhibit improved trunk control and weight shift following qped activity;                      PT Education - 10/03/18 1541    Education Details  transfers/positioning;    Person(s) Educated  Patient;Child(ren)    Methods  Explanation;Verbal cues    Comprehension  Verbalized understanding;Returned demonstration;Verbal cues required;Need further  instruction       PT Short Term Goals - 09/11/18 1704      PT SHORT TERM GOAL #1   Title  Be independent with initial home exercise program for self-management of symptoms.    Baseline  HEP to be given at second session; advice to practice unsupported sitting at home (06/19/2018); 07/08/18: Performing at home, 6/29: needs assistance but is doing exercise program regularly; 08/22/18: adherent;    Time  2    Period  Weeks    Status  Achieved    Target Date  06/26/18        PT Long Term Goals - 09/11/18 1704      PT LONG TERM GOAL #1   Title  Patient will  complete all bed mobility with min A to improve functional independence for getting in and out of bed and adjusting in bed.     Baseline  max A - total A reported by family (06/19/2018); 07/08/18: still requires maxA, dtr reports improvement in rolling since starting therapy; 6/23: min A to supervision in clinic; not doing at home right; 8/5: Pt's daughter states that his rolling has improved, but still has difficulty with all other bed mobility    Time  12    Period  Weeks    Status  Partially Met    Target Date  12/04/18      PT LONG TERM GOAL #2   Title  Patient will complete sit <> stand transfer chair to chair with Min A and LRAD to improve functional independence for household and community mobility.     Baseline  max A +2 STS at W/C (06/12/2018); max A +2-3 STS or stand pivot transfer, able to complete more reps (06/19/2018); 07/08/18: not safe to attempt at this time; 09/11/18: requires maxA+1 or modA+2 for STSs and squat pivot transfer    Time  12    Period  Weeks    Status  On-going    Target Date  12/04/18      PT LONG TERM GOAL #3   Title  Patient will navigate manual w/c with min A x 100 feet to improve mobility for household and short community distances.     Baseline  required total A (06/12/2018); able to roll 20 feet with min A (06/20/2018); 07/08/18: Pt can go approximately 10' without assist per family;    Time  12    Period   Weeks    Status  On-going    Target Date  12/04/18      PT LONG TERM GOAL #4   Title  Patient will complete car transfers W/C to family SUV with min A using LRAD to improve his abilty to participate in community activities.     Baseline  unable to complete car transfers at all (06/12/2018); unable to complete car transfers at all (06/19/2018).; 07/08/18: Unable to perform at this time; 09/11/18: unable to perform at this time    Time  12    Period  Weeks    Status  On-going    Target Date  12/04/18      PT LONG TERM GOAL #5   Title  Patient will ambulate at least 150 feet with mod I using LRAD to improve mobility for household and community distances.     Baseline  unable to take steps (06/12/2018); able to shift R leg forward and back with max A and RW at edge of plinth (06/19/2018); 07/08/18: unable to ambulate at this time. 09/11/18: unable to perform at this time    Time  12    Period  Weeks    Status  On-going    Target Date  12/04/18      Additional Long Term Goals   Additional Long Term Goals  Yes      PT LONG TERM GOAL #6   Title  Patient will navigate 4 steps with min A and BUE support to improve his ability to access community and social participation.     Baseline  unable to complet stairs (06/12/2018); unable to complete stairs (06/19/2018); 07/08/18: unable to attempt at this time; 09/11/18: unsafe to attempt at this time    Time  12    Period  Weeks  Status  On-going    Target Date  12/04/18      PT LONG TERM GOAL #7   Title  Patient will increase functional reach test to >15 inches in sitting to exhibit improved sitting balance and positioning and reduce fall risk;    Baseline  07/30/18: 12 inches; 09/11/18: 10-12 inches    Time  4    Period  Weeks    Status  Partially Met    Target Date  12/04/18            Plan - 10/03/18 1542    Clinical Impression Statement  Patient required increased assistance to achieve qped this session due to weakness and fatigue in UE and LE. He  required increased cues for proper positioning. While patient required increased assistance to achieve position, he was able to exhibit improved weight shift and better extension activation holding qped with pball under stomach for approximately 15 sec unsupported. He continues to exhibit improved trunk control and better sitting balance following qped exercise; Patient would benefit from additional skilled PT Intervention to improve strength and mobilty;    Personal Factors and Comorbidities  Age;Comorbidity 3+    Comorbidities  Relevant past medical history and comorbidities include long term steroid use for lupus, CKD, chronic osteomyelitis, cervical spine stenosis, BPH, APS, alcohol abuse, peripheral artery disease, depression, GERD, obstructive sleep apnea, HTN, IBS, IgA deficiency, lumbar radiculopathy, RA, Stroke, toxic maculopathy in both eyes, systemic lupus erythematosus related syndrome, cardiac catheterization, carotid endarterectomy, cervical laminectomy, coronary angioplasty, Fusion C5-C7, knee arthroplasty, lumbar surgery.    Examination-Activity Limitations  Bed Mobility;Bend;Sit;Toileting;Stand;Stairs;Lift;Transfers;Squat;Locomotion Level;Carry;Dressing;Hygiene/Grooming;Continence    Examination-Participation Restrictions  Yard Work;Interpersonal Relationship;Community Activity    Stability/Clinical Decision Making  Evolving/Moderate complexity    Rehab Potential  Fair    PT Frequency  3x / week    PT Duration  12 weeks    PT Treatment/Interventions  ADLs/Self Care Home Management;Electrical Stimulation;Moist Heat;Cryotherapy;Gait training;Stair training;Functional mobility training;Therapeutic exercise;Balance training;Neuromuscular re-education;Cognitive remediation;Patient/family education;Orthotic Fit/Training;Wheelchair mobility training;Manual techniques;Passive range of motion;Energy conservation;Joint Manipulations    PT Next Visit Plan  Work on strength, sitting/standing balance,  functional activities such as rolling, bed mobility, transfers    PT Home Exercise Plan  HEP handout provided via Beth last week    Consulted and Agree with Plan of Care  Patient;Family member/caregiver    Family Member Consulted  daughter [name]       Patient will benefit from skilled therapeutic intervention in order to improve the following deficits and impairments:  Abnormal gait, Decreased activity tolerance, Decreased cognition, Decreased endurance, Decreased knowledge of use of DME, Decreased range of motion, Decreased skin integrity, Decreased strength, Impaired perceived functional ability, Impaired sensation, Impaired UE functional use, Improper body mechanics, Pain, Cardiopulmonary status limiting activity, Decreased balance, Decreased coordination, Decreased mobility, Difficulty walking, Impaired tone, Postural dysfunction  Visit Diagnosis: Muscle weakness (generalized)  Other lack of coordination  Difficulty in walking, not elsewhere classified     Problem List There are no active problems to display for this patient.   Trotter,Margaret PT, DPT 10/03/2018, 3:44 PM  [place] MAIN [facility] SERVICES [address] [place], [place], [address] Phone: [phone]   Fax:  [phone]  Name: Kenneth Summers MRN: [id number] Date of Birth: 1945-08-24

## 2018-10-03 NOTE — Therapy (Signed)
[place] [facility] SERVICES [address] [place], [place], [address] Phone: [phone]   Fax:  [phone]  Occupational Therapy Treatment  Patient Details  Name: Kenneth Summers MRN: [id number] Date of Birth: 10/04/1945 No data recorded  Encounter Date: 10/03/2018  OT End of Session - 10/03/18 1740    Visit Number  44    Number of Visits  36    Date for OT Re-Evaluation  11/27/18    Authorization Type  Progress report period starting 09/26/2018    OT Start Time  1523    OT Stop Time  1600    OT Time Calculation (min)  37 min    Activity Tolerance  Patient tolerated treatment well    Behavior During Therapy  [facility] for tasks assessed/performed       Past Medical History:  Diagnosis Date  . Alcohol abuse   . APS (antiphospholipid syndrome) ([place])   . BPH (benign prostatic hyperplasia)   . CAD (coronary artery disease)   . Cellulitis   . Cervical spinal stenosis   . Chronic osteomyelitis ([place])   . CKD (chronic kidney disease)   . CKD (chronic kidney disease)   . Clostridium difficile diarrhea   . Depression   . Diplopia   . Fatigue   . GERD (gastroesophageal reflux disease)   . Hyperlipemia   . Hypertension   . Hypovitaminosis D   . IBS (irritable bowel syndrome)   . 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 Insomnia   . Left lumbosacral radiculopathy   . Moderate obstructive sleep apnea   . Osteomyelitis of foot ([place])   . PAD (peripheral artery disease) ([place])   . Pruritus   . RA (rheumatoid arthritis) ([place])   . Radiculopathy   . Restless legs syndrome   . RLS (restless legs syndrome)   . SLE (systemic lupus erythematosus related syndrome) ([place])   . Stroke ([place])   . Toxic maculopathy of both eyes     Past Surgical History:  Procedure Laterality Date  . CARDIAC CATHETERIZATION    . CAROTID ENDARTERECTOMY    . CERVICAL LAMINECTOMY    . CORONARY ANGIOPLASTY    . FRACTURE SURGERY    . fusion C5-6-7    . HERNIA REPAIR    . KNEE  ARTHROSCOPY    . TONSILLECTOMY      There were no vitals filed for this visit.  Subjective Assessment - 10/03/18 1739    Subjective   Pt. with no pain complaints today    Patient is accompanied by:  Family member    Pertinent History  Pt. is a 73 y.o. 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 Pt. PMHx includes: Multiple Falls, Lupus, DJD, Renal Abscess, CVA. Pt. resides with his wife. Pt.'s wife and daughter assist with ADLs. Pt. has caregivers in  for 2 hours a day, 6 days a week. Pt. received Rehab services in acute care, at SNF for STR, and [place] services. Pt. is retired from [employer] for [employer] and [employer].    Currently in Pain?  No/denies      OT TREATMENT    Neuro muscular re-education:  Pt. worked on using his left hand to grasp single wide clips, positioning them in his hand, pressing them with his 2nd digit, and thumb in preparation for placing them onto a small horizontal dowel. Pt. required verbal, and tactile cues to use his left hand to grasp one  clip at time from a container full of clips. Pt. worked on progressing to grasping mini clips, positioning them in his hand, and placing them onto an elevated surface with cues for hand, and clip position.                            OT Education - 10/03/18 1740    Education Details  LUE functioning    Person(s) Educated  Patient    Methods  Explanation;Demonstration    Comprehension  Verbalized understanding;Returned demonstration;Verbal cues required          OT Long Term Goals - 09/25/18 1719      OT LONG TERM GOAL #1   Title  Pt. will increase left shoulder flexion AROM by 10 degrees to access cabinet/shelf.    Baseline  AROM left shoulder flexion: 108    Time  12    Period  Weeks    Status  On-going    Target Date  11/27/18      OT LONG TERM GOAL #2   Title  Pt. will donn a shirt with Supervision.    Baseline  Min-ModA    Time  12     Period  Weeks    Status  On-going    Target Date  11/27/18      OT LONG TERM GOAL #3   Title  Pt. will require ModA to perform LE dressing    Baseline  MaxA left, ModA with the right    Time  12    Period  Weeks    Status  On-going    Target Date  11/27/18      OT LONG TERM GOAL #4   Title  Pt. will improve left grip strength by 10# to be able to open a jar/container.    Baseline  Left: 30, Right 40    Time  12    Period  Weeks    Status  On-going    Target Date  11/27/18      OT LONG TERM GOAL #5   Title  Pt. will improve left hand [facility] skills to be able to assit with buttoning/zipping.    Baseline  Left: 1 min & 50 sec. R: 56 sec.    Time  12    Period  Weeks    Status  New    Target Date  11/27/18      OT LONG TERM GOAL #6   Title  Pt. will demonstrate visual conmpensatory strategies during 100% of the time during ADLs    Baseline  Pt. requires min-mod for left side awareness for tabletop tasks in his near space, Max cues in the extrapersonal space    Time  12    Period  Weeks    Status  On-going    Target Date  11/27/18      OT LONG TERM GOAL #7   Title  Pt. will prepare a simple cold snack from w/c with supervision using cognitive compensatory strategies 100% of the time.    Baseline  Pt. is unable    Time  12    Period  Weeks    Status  On-going    Target Date  11/27/18      OT LONG TERM GOAL #8   Title  Pt. will accurately identify potential safety hazard using good safety awareness, and judgement 100% for ADLs, and IADLs.    Baseline  limited  Time  12    Period  Weeks    Status  On-going    Target Date  11/27/18      OT LONG TERM GOAL  #9   TITLE  Pt. will  navigate his w/c around obstacles with Supervision and 100% accuracy    Baseline  Pt. requires maxA, and heavy cuing for obstacle on the left    Time  12    Period  Weeks    Status  On-going    Target Date  11/27/18            Plan - 10/03/18 1740    Clinical Impression Statement  Pt.  is making steady progress overall. Pt. continues to require fewer cues for left sided awareness, and has improved sustained attention during tasks. Pt. continues to require cues to engage his left UE during ADL, and IADL tasks with fewer cues to initiate with the left hand. Pt. continues to work on improving LUE functioning to improve engagement in ADLs, and IADLs.    OT Occupational Profile and History  Problem Focused Assessment - Including review of records relating to presenting problem    Occupational performance deficits (Please refer to evaluation for details):  ADL's    Body Structure / Function / Physical Skills  UE functional use;Coordination;FMC;Dexterity;Strength;ROM    Cognitive Skills  Attention;Memory;Emotional;Problem Solve;Safety Awareness    Rehab Potential  Good    Clinical Decision Making  Several treatment options, min-mod task modification necessary    Comorbidities Affecting Occupational Performance:  Presence of comorbidities impacting occupational performance    Modification or Assistance to Complete Evaluation   No modification of tasks or assist necessary to complete eval    OT Frequency  3x / week    OT Duration  12 weeks    OT Treatment/Interventions  Self-care/ADL training;DME and/or AE instruction;Therapeutic exercise;Therapeutic activities;Moist Heat;Cognitive remediation/compensation;Neuromuscular education;Visual/perceptual remediation/compensation;Coping strategies training;Patient/family education;Passive range of motion;Psychosocial skills training;Energy conservation;Functional Mobility Training    Recommended Other Services  PT    Family Member Consulted  Daughter [name]       Patient will benefit from skilled therapeutic intervention in order to improve the following deficits and impairments:   Body Structure / Function / Physical Skills: UE functional use, Coordination, FMC, Dexterity, Strength, ROM Cognitive Skills: Attention, Memory, Emotional, Problem  Solve, Safety Awareness     Visit Diagnosis: Muscle weakness (generalized)  Other lack of coordination    Problem List There are no active problems to display for this patient.   Harrel Carina, MS, OTR/L 10/03/2018, 5:50 PM  [place] MAIN [facility] SERVICES [address] [place], [place], [address] Phone: [phone]   Fax:  [phone]  Name: Kenneth Summers MRN: [id number] Date of Birth: 30-Oct-1945

## 2018-10-08 ENCOUNTER — Encounter: Payer: Self-pay | Admitting: Physical Therapy

## 2018-10-08 ENCOUNTER — Ambulatory Visit: Payer: Medicare Other | Admitting: Physical Therapy

## 2018-10-08 ENCOUNTER — Other Ambulatory Visit: Payer: Self-pay

## 2018-10-08 ENCOUNTER — Ambulatory Visit: Payer: Medicare Other | Attending: Family Medicine | Admitting: Occupational Therapy

## 2018-10-08 ENCOUNTER — Encounter: Payer: Self-pay | Admitting: Occupational Therapy

## 2018-10-08 DIAGNOSIS — R41841 Cognitive communication deficit: Secondary | ICD-10-CM | POA: Diagnosis present

## 2018-10-08 DIAGNOSIS — R296 Repeated falls: Secondary | ICD-10-CM | POA: Insufficient documentation

## 2018-10-08 DIAGNOSIS — R262 Difficulty in walking, not elsewhere classified: Secondary | ICD-10-CM | POA: Diagnosis present

## 2018-10-08 DIAGNOSIS — R278 Other lack of coordination: Secondary | ICD-10-CM

## 2018-10-08 DIAGNOSIS — H547 Unspecified visual loss: Secondary | ICD-10-CM | POA: Insufficient documentation

## 2018-10-08 DIAGNOSIS — M6281 Muscle weakness (generalized): Secondary | ICD-10-CM | POA: Diagnosis not present

## 2018-10-08 NOTE — Therapy (Signed)
[place] MAIN [facility] SERVICES [address] [place], [place], [address] Phone: [phone]   Fax:  [phone]  Occupational Therapy Treatment  Patient Details  Name: Kenneth Summers MRN: [id number] Date of Birth: 1945/06/03 No data recorded  Encounter Date: 10/08/2018  OT End of Session - 10/08/18 1358    Visit Number  45    Number of Visits  72    Date for OT Re-Evaluation  11/27/18    OT Start Time  1300    OT Stop Time  1345    OT Time Calculation (min)  45 min    Activity Tolerance  Patient tolerated treatment well    Behavior During Therapy  [facility] for tasks assessed/performed       Past Medical History:  Diagnosis Date  . Alcohol abuse   . APS (antiphospholipid syndrome) ([place])   . BPH (benign prostatic hyperplasia)   . CAD (coronary artery disease)   . Cellulitis   . Cervical spinal stenosis   . Chronic osteomyelitis ([place])   . CKD (chronic kidney disease)   . CKD (chronic kidney disease)   . Clostridium difficile diarrhea   . Depression   . Diplopia   . Fatigue   . GERD (gastroesophageal reflux disease)   . Hyperlipemia   . Hypertension   . Hypovitaminosis D   . IBS (irritable bowel syndrome)   . 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 Insomnia   . Left lumbosacral radiculopathy   . Moderate obstructive sleep apnea   . Osteomyelitis of foot ([place])   . PAD (peripheral artery disease) ([place])   . Pruritus   . RA (rheumatoid arthritis) ([place])   . Radiculopathy   . Restless legs syndrome   . RLS (restless legs syndrome)   . SLE (systemic lupus erythematosus related syndrome) ([place])   . Stroke ([place])   . Toxic maculopathy of both eyes     Past Surgical History:  Procedure Laterality Date  . CARDIAC CATHETERIZATION    . CAROTID ENDARTERECTOMY    . CERVICAL LAMINECTOMY    . CORONARY ANGIOPLASTY    . FRACTURE SURGERY    . fusion C5-6-7    . HERNIA REPAIR    . KNEE ARTHROSCOPY    . TONSILLECTOMY      There were no vitals filed for  this visit.  Subjective Assessment - 10/08/18 1357    Patient is accompanied by:  Family member    Pertinent History  Pt. is a 73 y.o. 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 Pt. PMHx includes: Multiple Falls, Lupus, DJD, Renal Abscess, CVA. Pt. resides with his wife. Pt.'s wife and daughter assist with ADLs. Pt. has caregivers in  for 2 hours a day, 6 days a week. Pt. received Rehab services in acute care, at SNF for STR, and [place] services. Pt. is retired from [employer] for [employer] and [employer].    Currently in Pain?  Yes    Pain Score  2     Pain Location  Head      OT TREATMENT    Therapeutic Activities:  Pt. worked on using bilateral UE  coordination to build a PCP Pipe tower following design patterns of simple, and moderate complexity. Pt. required cues to engage his LUE during reaching to the left to retrieve the pieces from a shallow container. Fewer cues were required to use both hands together to connect the pieces. Pt. required increased  time to complete the patterns of moderate complexity. Pt. required increased, consistent cuing today to use his LUE during the task. Pt. Required cues to use his fingers to mark his space on the pattern design.                        OT Education - 10/08/18 1358    Education Details  LUE functioning    Person(s) Educated  Patient    Methods  Explanation;Demonstration    Comprehension  Verbalized understanding;Returned demonstration;Verbal cues required          OT Long Term Goals - 09/25/18 1719      OT LONG TERM GOAL #1   Title  Pt. will increase left shoulder flexion AROM by 10 degrees to access cabinet/shelf.    Baseline  AROM left shoulder flexion: 108    Time  12    Period  Weeks    Status  On-going    Target Date  11/27/18      OT LONG TERM GOAL #2   Title  Pt. will donn a shirt with Supervision.    Baseline  Min-ModA    Time  12    Period  Weeks     Status  On-going    Target Date  11/27/18      OT LONG TERM GOAL #3   Title  Pt. will require ModA to perform LE dressing    Baseline  MaxA left, ModA with the right    Time  12    Period  Weeks    Status  On-going    Target Date  11/27/18      OT LONG TERM GOAL #4   Title  Pt. will improve left grip strength by 10# to be able to open a jar/container.    Baseline  Left: 30, Right 40    Time  12    Period  Weeks    Status  On-going    Target Date  11/27/18      OT LONG TERM GOAL #5   Title  Pt. will improve left hand [facility] skills to be able to assit with buttoning/zipping.    Baseline  Left: 1 min & 50 sec. R: 56 sec.    Time  12    Period  Weeks    Status  New    Target Date  11/27/18      OT LONG TERM GOAL #6   Title  Pt. will demonstrate visual conmpensatory strategies during 100% of the time during ADLs    Baseline  Pt. requires min-mod for left side awareness for tabletop tasks in his near space, Max cues in the extrapersonal space    Time  12    Period  Weeks    Status  On-going    Target Date  11/27/18      OT LONG TERM GOAL #7   Title  Pt. will prepare a simple cold snack from w/c with supervision using cognitive compensatory strategies 100% of the time.    Baseline  Pt. is unable    Time  12    Period  Weeks    Status  On-going    Target Date  11/27/18      OT LONG TERM GOAL #8   Title  Pt. will accurately identify potential safety hazard using good safety awareness, and judgement 100% for ADLs, and IADLs.    Baseline  limited    Time  12    Period  Weeks    Status  On-going    Target Date  11/27/18      OT LONG TERM GOAL  #9   TITLE  Pt. will  navigate his w/c around obstacles with Supervision and 100% accuracy    Baseline  Pt. requires maxA, and heavy cuing for obstacle on the left    Time  12    Period  Weeks    Status  On-going    Target Date  11/27/18            Plan - 10/08/18 1359    Clinical Impression Statement Pt. reports having had  newer onset of headaches this week. Pt. reports 2/10 headache today. Pt. required increased verbal cues for left sided awareness, and to engage his LUE during tasks. Pt. continues to work on improving UE functioning during ADls, and IADLs.    OT Occupational Profile and History  Problem Focused Assessment - Including review of records relating to presenting problem    Occupational performance deficits (Please refer to evaluation for details):  ADL's    Body Structure / Function / Physical Skills  UE functional use;Coordination;FMC;Dexterity;Strength;ROM    Cognitive Skills  Attention;Memory;Emotional;Problem Solve;Safety Awareness    Rehab Potential  Good    Clinical Decision Making  Several treatment options, min-mod task modification necessary    Comorbidities Affecting Occupational Performance:  Presence of comorbidities impacting occupational performance    Comorbidities impacting occupational performance description:  Phyical, cognitive, visual,  medical comorbidities    OT Frequency  3x / week    OT Duration  12 weeks    OT Treatment/Interventions  Self-care/ADL training;DME and/or AE instruction;Therapeutic exercise;Therapeutic activities;Moist Heat;Cognitive remediation/compensation;Neuromuscular education;Visual/perceptual remediation/compensation;Coping strategies training;Patient/family education;Passive range of motion;Psychosocial skills training;Energy conservation;Functional Mobility Training    Consulted and Agree with Plan of Care  Patient    Family Member Consulted  Daughter [name]       Patient will benefit from skilled therapeutic intervention in order to improve the following deficits and impairments:   Body Structure / Function / Physical Skills: UE functional use, Coordination, FMC, Dexterity, Strength, ROM Cognitive Skills: Attention, Memory, Emotional, Problem Solve, Safety Awareness     Visit Diagnosis: Muscle weakness (generalized)  Other lack of  coordination    Problem List There are no active problems to display for this patient.   Harrel Carina, MS, OTR/L 10/08/2018, 2:06 PM  [place] MAIN [facility] SERVICES [address] [place], [place], [address] Phone: [phone]   Fax:  [phone]  Name: Kenneth Summers MRN: [id number] Date of Birth: Mar 13, 1945

## 2018-10-08 NOTE — Therapy (Signed)
[place] MAIN [facility] SERVICES [address] [place], [place], [address] Phone: [phone]   Fax:  [phone]  Physical Therapy Treatment  Patient Details  Name: Kenneth Summers MRN: [id number] Date of Birth: 1945/07/14 Referring Provider (PT): Dr. [name], Guy Begin   Encounter Date: 10/08/2018  PT End of Session - 10/08/18 1257    Visit Number  46    Number of Visits  35    Date for PT Re-Evaluation  12/04/18    Authorization Type  Medicare reporting period starting 09/11/18    Authorization Time Period  Current cert period 0/03/5850-77/82/42    PT Start Time  1345    PT Stop Time  1430    PT Time Calculation (min)  45 min    Equipment Utilized During Treatment  Gait belt    Activity Tolerance  Patient limited by fatigue    Behavior During Therapy  [facility] for tasks assessed/performed       Past Medical History:  Diagnosis Date  . Alcohol abuse   . APS (antiphospholipid syndrome) ([place])   . BPH (benign prostatic hyperplasia)   . CAD (coronary artery disease)   . Cellulitis   . Cervical spinal stenosis   . Chronic osteomyelitis ([place])   . CKD (chronic kidney disease)   . CKD (chronic kidney disease)   . Clostridium difficile diarrhea   . Depression   . Diplopia   . Fatigue   . GERD (gastroesophageal reflux disease)   . Hyperlipemia   . Hypertension   . Hypovitaminosis D   . IBS (irritable bowel syndrome)   . 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 Insomnia   . Left lumbosacral radiculopathy   . Moderate obstructive sleep apnea   . Osteomyelitis of foot ([place])   . PAD (peripheral artery disease) ([place])   . Pruritus   . RA (rheumatoid arthritis) ([place])   . Radiculopathy   . Restless legs syndrome   . RLS (restless legs syndrome)   . SLE (systemic lupus erythematosus related syndrome) ([place])   . Stroke ([place])   . Toxic maculopathy of both eyes     Past Surgical History:  Procedure Laterality Date  . CARDIAC CATHETERIZATION    . CAROTID  ENDARTERECTOMY    . CERVICAL LAMINECTOMY    . CORONARY ANGIOPLASTY    . FRACTURE SURGERY    . fusion C5-6-7    . HERNIA REPAIR    . KNEE ARTHROSCOPY    . TONSILLECTOMY      There were no vitals filed for this visit.  Subjective Assessment - 10/08/18 1353    Subjective  Patient reports having a slight headache today; Otherwise he feels well;    Patient is accompained by:  Family member    Pertinent History  Patient is a 73 y.o. male who presents to outpatient physical therapy with a referral for medical diagnosis of CVA. This patient's chief complaints consist of left hemiplegia and overall deconditioning leading to the following functional deficits: dependent for ADLs, IADLs, unable to transfer to car, dependent transfers at home with hoyer lift, unable to walk or stand without significant assistance, difficulty with W/C navigation..    Limitations  Sitting;Lifting;Standing;House hold activities;Writing;Walking;Other (comment)    Diagnostic tests  Brain MRI 11/01/2017: IMPRESSION: 1. Acute right ACA territory infarct as demonstrated on prior CT imaging. No intracranial hemorrhage or significant mass effect. 2. 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    Patient Stated Goals  wants to be able to walk again    Currently in Pain?  Yes    Pain Score  1     Pain Location  Head    Pain Orientation  Anterior    Pain Descriptors / Indicators  Aching    Pain Type  Acute pain    Pain Onset  Today              TREATMENT:  Transferred wheelchair<>mat table mod A +2; patient required increased cues for proper positioning and increased time to prepare to transfer;  Transitioned to supine, minA +2with assistance for lifting LLE and cues for proper positioning; Rolled to prone mod A +2, required increased time and mod VCs for UE positioning and assistance for LE positioning; Transitioned to qped mod A +2 with pball under stomach Cues for  erect posture, weight bearing through UE and increase upper trunk control x2-3 min; with prolonged position, patient often collapses over pball without weight bearing in UE; however with increased instruction he wasable to increase push through UE for better qped position and improved trunk control; able to hold qped position with close supervision for 20 sec with good trunk control and less loss of balance;  Qped over pball: forward/backward weight shift x5 reps with min A +2 for balance and mod VCs for proper positioning and trunk activation;  Transitioned to sitting edge of mat table, unsupported x2-3 min with good posture and upper trunk control;patient able to exhibit improved trunk control without lateral or posterior loss of balance being able to hold erect posture with distant supervision;  Transferred sit<>stand with RW (patient wearing shoes without discomfort on RLE), mod A +2 with mod-max VCs for proper hand placement; initially patient exhibits better forward weight shift; He was able to stand with improved erect posture and better trunk extension but requires cues for forward weight shift for better stance control; completed 3 sit<> stand transfers for 10 sec, 1 min and 30 sec each;  Transferred mat table to wheelchair modA+2with increased time required and cues for weight shift for proper positioning;    Response to treatment: patient tolerated well. He does report increased fatigue at end of session. However he does exhibit improved sitting posture and positioning following qped positioning.He denies any discomfort or pain;He does require increased assistance for positioning in chair at end of session due to fatigue. Patient able to exhibit improved trunk control and weight shift following qped activity;                   PT Education - 10/08/18 1257    Education Details  transfers/positioning;    Person(s) Educated  Patient;Child(ren)    Methods   Explanation;Demonstration;Verbal cues    Comprehension  Verbalized understanding;Returned demonstration;Verbal cues required;Need further instruction       PT Short Term Goals - 09/11/18 1704      PT SHORT TERM GOAL #1   Title  Be independent with initial home exercise program for self-management of symptoms.    Baseline  HEP to be given at second session; advice to practice unsupported sitting at home (06/19/2018); 07/08/18: Performing at home, 6/29: needs assistance but is doing exercise program regularly; 08/22/18: adherent;    Time  2    Period  Weeks    Status  Achieved    Target Date  06/26/18        PT Long Term Goals - 09/11/18 1704      PT LONG TERM GOAL #1  Title  Patient will complete all bed mobility with min A to improve functional independence for getting in and out of bed and adjusting in bed.     Baseline  max A - total A reported by family (06/19/2018); 07/08/18: still requires maxA, dtr reports improvement in rolling since starting therapy; 6/23: min A to supervision in clinic; not doing at home right; 8/5: Pt's daughter states that his rolling has improved, but still has difficulty with all other bed mobility    Time  12    Period  Weeks    Status  Partially Met    Target Date  12/04/18      PT LONG TERM GOAL #2   Title  Patient will complete sit <> stand transfer chair to chair with Min A and LRAD to improve functional independence for household and community mobility.     Baseline  max A +2 STS at W/C (06/12/2018); max A +2-3 STS or stand pivot transfer, able to complete more reps (06/19/2018); 07/08/18: not safe to attempt at this time; 09/11/18: requires maxA+1 or modA+2 for STSs and squat pivot transfer    Time  12    Period  Weeks    Status  On-going    Target Date  12/04/18      PT LONG TERM GOAL #3   Title  Patient will navigate manual w/c with min A x 100 feet to improve mobility for household and short community distances.     Baseline  required total A  (06/12/2018); able to roll 20 feet with min A (06/20/2018); 07/08/18: Pt can go approximately 10' without assist per family;    Time  12    Period  Weeks    Status  On-going    Target Date  12/04/18      PT LONG TERM GOAL #4   Title  Patient will complete car transfers W/C to family SUV with min A using LRAD to improve his abilty to participate in community activities.     Baseline  unable to complete car transfers at all (06/12/2018); unable to complete car transfers at all (06/19/2018).; 07/08/18: Unable to perform at this time; 09/11/18: unable to perform at this time    Time  12    Period  Weeks    Status  On-going    Target Date  12/04/18      PT LONG TERM GOAL #5   Title  Patient will ambulate at least 150 feet with mod I using LRAD to improve mobility for household and community distances.     Baseline  unable to take steps (06/12/2018); able to shift R leg forward and back with max A and RW at edge of plinth (06/19/2018); 07/08/18: unable to ambulate at this time. 09/11/18: unable to perform at this time    Time  12    Period  Weeks    Status  On-going    Target Date  12/04/18      Additional Long Term Goals   Additional Long Term Goals  Yes      PT LONG TERM GOAL #6   Title  Patient will navigate 4 steps with min A and BUE support to improve his ability to access community and social participation.     Baseline  unable to complet stairs (06/12/2018); unable to complete stairs (06/19/2018); 07/08/18: unable to attempt at this time; 09/11/18: unsafe to attempt at this time    Time  12    Period  Weeks    Status  On-going    Target Date  12/04/18      PT LONG TERM GOAL #7   Title  Patient will increase functional reach test to >15 inches in sitting to exhibit improved sitting balance and positioning and reduce fall risk;    Baseline  07/30/18: 12 inches; 09/11/18: 10-12 inches    Time  4    Period  Weeks    Status  Partially Met    Target Date  12/04/18            Plan - 10/09/18 0949     Clinical Impression Statement  Patient motivated this session. He was able to transition to qped better with less assistance. Patient does require increased cues for proper weight shift and trunk control, but was able to hold qped positioning for longer period of time this session with less fatigue. Patient progressing in sit<>stand transfers being able to hold standing position for approximately1 min with mod A +2. he was able to extend trunk better this session but fatigues quickly often looking down leading to flexed hips/knees. Patient requires continuous cues for proper positioning/hand placement for safety awareness. He would benefit from additional skilled PT intervention to improve strength, balance and mobility;    Personal Factors and Comorbidities  Age;Comorbidity 3+    Comorbidities  Relevant past medical history and comorbidities include long term steroid use for lupus, CKD, chronic osteomyelitis, cervical spine stenosis, BPH, APS, alcohol abuse, peripheral artery disease, depression, GERD, obstructive sleep apnea, HTN, IBS, IgA deficiency, lumbar radiculopathy, RA, Stroke, toxic maculopathy in both eyes, systemic lupus erythematosus related syndrome, cardiac catheterization, carotid endarterectomy, cervical laminectomy, coronary angioplasty, Fusion C5-C7, knee arthroplasty, lumbar surgery.    Examination-Activity Limitations  Bed Mobility;Bend;Sit;Toileting;Stand;Stairs;Lift;Transfers;Squat;Locomotion Level;Carry;Dressing;Hygiene/Grooming;Continence    Examination-Participation Restrictions  Yard Work;Interpersonal Relationship;Community Activity    Stability/Clinical Decision Making  Evolving/Moderate complexity    Rehab Potential  Fair    PT Frequency  3x / week    PT Duration  12 weeks    PT Treatment/Interventions  ADLs/Self Care Home Management;Electrical Stimulation;Moist Heat;Cryotherapy;Gait training;Stair training;Functional mobility training;Therapeutic exercise;Balance  training;Neuromuscular re-education;Cognitive remediation;Patient/family education;Orthotic Fit/Training;Wheelchair mobility training;Manual techniques;Passive range of motion;Energy conservation;Joint Manipulations    PT Next Visit Plan  Work on strength, sitting/standing balance, functional activities such as rolling, bed mobility, transfers    PT Home Exercise Plan  HEP handout provided via Beth last week    Consulted and Agree with Plan of Care  Patient;Family member/caregiver    Family Member Consulted  daughter [name]       Patient will benefit from skilled therapeutic intervention in order to improve the following deficits and impairments:  Abnormal gait, Decreased activity tolerance, Decreased cognition, Decreased endurance, Decreased knowledge of use of DME, Decreased range of motion, Decreased skin integrity, Decreased strength, Impaired perceived functional ability, Impaired sensation, Impaired UE functional use, Improper body mechanics, Pain, Cardiopulmonary status limiting activity, Decreased balance, Decreased coordination, Decreased mobility, Difficulty walking, Impaired tone, Postural dysfunction  Visit Diagnosis: Muscle weakness (generalized)  Other lack of coordination  Difficulty in walking, not elsewhere classified     Problem List There are no active problems to display for this patient.   , PT, DPT 10/09/2018, 9:51 AM  [place] MAIN [facility] SERVICES [address] [place], [place], [address] Phone: [phone]   Fax:  [phone]  Name: Kenneth Summers MRN: [id number] Date of Birth: October 26, 1945

## 2018-10-09 ENCOUNTER — Ambulatory Visit: Payer: Medicare Other | Admitting: Occupational Therapy

## 2018-10-09 ENCOUNTER — Encounter: Payer: Self-pay | Admitting: Physical Therapy

## 2018-10-09 ENCOUNTER — Encounter: Payer: Self-pay | Admitting: Occupational Therapy

## 2018-10-09 ENCOUNTER — Ambulatory Visit: Payer: Medicare Other | Admitting: Physical Therapy

## 2018-10-09 ENCOUNTER — Other Ambulatory Visit: Payer: Self-pay

## 2018-10-09 DIAGNOSIS — M6281 Muscle weakness (generalized): Secondary | ICD-10-CM

## 2018-10-09 DIAGNOSIS — R278 Other lack of coordination: Secondary | ICD-10-CM

## 2018-10-09 DIAGNOSIS — R262 Difficulty in walking, not elsewhere classified: Secondary | ICD-10-CM

## 2018-10-09 NOTE — Therapy (Signed)
[place] MAIN [facility] SERVICES [address] [place], [place], [address] Phone: [phone]   Fax:  [phone]  Physical Therapy Treatment  Patient Details  Name: Kenneth Summers MRN: [id number] Date of Birth: March 02, 1945 Referring Provider (PT): Dr. [name], Guy Begin   Encounter Date: 10/09/2018  PT End of Session - 10/09/18 1451    Visit Number  58    Number of Visits  45    Date for PT Re-Evaluation  12/04/18    Authorization Type  Medicare reporting period starting 09/11/18    Authorization Time Period  Current cert period 08/14/5883-02/77/41    PT Start Time  1302    PT Stop Time  1345    PT Time Calculation (min)  43 min    Equipment Utilized During Treatment  Gait belt    Activity Tolerance  Patient limited by fatigue    Behavior During Therapy  [facility] for tasks assessed/performed       Past Medical History:  Diagnosis Date  . Alcohol abuse   . APS (antiphospholipid syndrome) ([place])   . BPH (benign prostatic hyperplasia)   . CAD (coronary artery disease)   . Cellulitis   . Cervical spinal stenosis   . Chronic osteomyelitis ([place])   . CKD (chronic kidney disease)   . CKD (chronic kidney disease)   . Clostridium difficile diarrhea   . Depression   . Diplopia   . Fatigue   . GERD (gastroesophageal reflux disease)   . Hyperlipemia   . Hypertension   . Hypovitaminosis D   . IBS (irritable bowel syndrome)   . 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 Insomnia   . Left lumbosacral radiculopathy   . Moderate obstructive sleep apnea   . Osteomyelitis of foot ([place])   . PAD (peripheral artery disease) ([place])   . Pruritus   . RA (rheumatoid arthritis) ([place])   . Radiculopathy   . Restless legs syndrome   . RLS (restless legs syndrome)   . SLE (systemic lupus erythematosus related syndrome) ([place])   . Stroke ([place])   . Toxic maculopathy of both eyes     Past Surgical History:  Procedure Laterality Date  . CARDIAC CATHETERIZATION    . CAROTID  ENDARTERECTOMY    . CERVICAL LAMINECTOMY    . CORONARY ANGIOPLASTY    . FRACTURE SURGERY    . fusion C5-6-7    . HERNIA REPAIR    . KNEE ARTHROSCOPY    . TONSILLECTOMY      There were no vitals filed for this visit.  Subjective Assessment - 10/09/18 1449    Subjective  Patient reports doing well; Denies any pain; His daughter reports that he is more alert and having a good day. Patient reports that he has been sleeping in the hospital bed due to having difficulty using urinal and not wanting to have accidents in his personal king size bed. His daughter reports that the hoyer lift will work to transport him from the king size bed but they are hesitant due to having difficulty managing the urinal.    Patient is accompained by:  Family member    Pertinent History  Patient is a 73 y.o. male who presents to outpatient physical therapy with a referral for medical diagnosis of CVA. This patient's chief complaints consist of left hemiplegia and overall deconditioning leading to the following functional deficits: dependent for ADLs, IADLs, unable to transfer to car, dependent transfers at home with hoyer lift, unable to  walk or stand without significant assistance, difficulty with W/C navigation..    Limitations  Sitting;Lifting;Standing;House hold activities;Writing;Walking;Other (comment)    Diagnostic tests  Brain MRI 11/01/2017: IMPRESSION: 1. Acute right ACA territory infarct as demonstrated on prior CT imaging. No intracranial hemorrhage or significant mass effect. 2. 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    Patient Stated Goals  wants to be able to walk again    Currently in Pain?  No/denies    Pain Onset  Today    Multiple Pain Sites  No            TREATMENT:  Transferred wheelchair<>mat table mod A +2; patient required increased cues for proper positioning and increased time to prepare to transfer;  Transitioned to supine, minA +2with  assistance for lifting LLE and cues for proper positioning; Rolled to prone mod A +2, required increased time and mod VCs for UE positioning and assistance for LE positioning; Transitioned to qped mod A +2(no physioball used) Cues for erect posture, weight bearing through UE and increase upper trunk control x2-3 min; patient able to hold self up into qped with supervision in neutral position x1 min without physioball with good weight bearing in BUE/BLE;  Qped: forward/backward weight shift x5 reps with min A +2 for balance and mod VCs for proper positioning and trunk activation;  Attempted transition to tall kneeling with therapist holding patient's hands to assist with balance; However patient unable due to complaints of knee pain and fatigue;   Transitioned to sitting edge of mat table, unsupported x2-3 min with good posture and upper trunk control;patient able to exhibit improved trunk control without lateral or posterior loss of balance being able to hold erect posture with distant supervision; Instructed patient in seated exercise: -Anterior/posterior pelvic rocks x5 reps; -arms across chest trunk rotation x5 reps bilaterally; Patient able to exhibit good trunk control and weight shift without loss of balance; Instructed patient/caregiver to start doing HEP seated on his personal bed to challenge edge of bed/unsupported sitting. Patient/caregiver agreeable.   Transferred sit<>stand with RW (patient wearing shoes without discomfort on RLE), mod A +2 with mod-max VCs for proper hand placement; initially patient exhibits better forward weight shift; He was able to stand with improved erect posture and better trunk extension but requires cues for forward weight shift for better stance control; completed 1 sit<> stand transfers for 1 min and 20 sec   Transferred mat table to wheelchair modA+2with increased time required and cues for weight shift for proper positioning;     Response to treatment: patient tolerated well. He does report increased fatigue at end of session. However he does exhibit improved sitting posture and positioning following qped positioning.He denies any discomfort or pain;He does require increased assistance for positioning in chair at end of session due to fatigue. Patient able to exhibit improved trunk control and weight shift following qped activity;                          PT Education - 10/09/18 1451    Education Details  transfers/positioning;    Person(s) Educated  Patient;Child(ren)    Methods  Explanation;Verbal cues    Comprehension  Verbalized understanding;Returned demonstration;Verbal cues required;Need further instruction       PT Short Term Goals - 09/11/18 1704      PT SHORT TERM GOAL #1   Title  Be independent with initial home exercise program for self-management of  symptoms.    Baseline  HEP to be given at second session; advice to practice unsupported sitting at home (06/19/2018); 07/08/18: Performing at home, 6/29: needs assistance but is doing exercise program regularly; 08/22/18: adherent;    Time  2    Period  Weeks    Status  Achieved    Target Date  06/26/18        PT Long Term Goals - 09/11/18 1704      PT LONG TERM GOAL #1   Title  Patient will complete all bed mobility with min A to improve functional independence for getting in and out of bed and adjusting in bed.     Baseline  max A - total A reported by family (06/19/2018); 07/08/18: still requires maxA, dtr reports improvement in rolling since starting therapy; 6/23: min A to supervision in clinic; not doing at home right; 8/5: Pt's daughter states that his rolling has improved, but still has difficulty with all other bed mobility    Time  12    Period  Weeks    Status  Partially Met    Target Date  12/04/18      PT LONG TERM GOAL #2   Title  Patient will complete sit <> stand transfer chair to chair with Min A and  LRAD to improve functional independence for household and community mobility.     Baseline  max A +2 STS at W/C (06/12/2018); max A +2-3 STS or stand pivot transfer, able to complete more reps (06/19/2018); 07/08/18: not safe to attempt at this time; 09/11/18: requires maxA+1 or modA+2 for STSs and squat pivot transfer    Time  12    Period  Weeks    Status  On-going    Target Date  12/04/18      PT LONG TERM GOAL #3   Title  Patient will navigate manual w/c with min A x 100 feet to improve mobility for household and short community distances.     Baseline  required total A (06/12/2018); able to roll 20 feet with min A (06/20/2018); 07/08/18: Pt can go approximately 10' without assist per family;    Time  12    Period  Weeks    Status  On-going    Target Date  12/04/18      PT LONG TERM GOAL #4   Title  Patient will complete car transfers W/C to family SUV with min A using LRAD to improve his abilty to participate in community activities.     Baseline  unable to complete car transfers at all (06/12/2018); unable to complete car transfers at all (06/19/2018).; 07/08/18: Unable to perform at this time; 09/11/18: unable to perform at this time    Time  12    Period  Weeks    Status  On-going    Target Date  12/04/18      PT LONG TERM GOAL #5   Title  Patient will ambulate at least 150 feet with mod I using LRAD to improve mobility for household and community distances.     Baseline  unable to take steps (06/12/2018); able to shift R leg forward and back with max A and RW at edge of plinth (06/19/2018); 07/08/18: unable to ambulate at this time. 09/11/18: unable to perform at this time    Time  12    Period  Weeks    Status  On-going    Target Date  12/04/18      Additional  Long Term Goals   Additional Long Term Goals  Yes      PT LONG TERM GOAL #6   Title  Patient will navigate 4 steps with min A and BUE support to improve his ability to access community and social participation.     Baseline  unable to  complet stairs (06/12/2018); unable to complete stairs (06/19/2018); 07/08/18: unable to attempt at this time; 09/11/18: unsafe to attempt at this time    Time  12    Period  Weeks    Status  On-going    Target Date  12/04/18      PT LONG TERM GOAL #7   Title  Patient will increase functional reach test to >15 inches in sitting to exhibit improved sitting balance and positioning and reduce fall risk;    Baseline  07/30/18: 12 inches; 09/11/18: 10-12 inches    Time  4    Period  Weeks    Status  Partially Met    Target Date  12/04/18            Plan - 10/09/18 1455    Clinical Impression Statement  Patient progressing well. He was able to hold self up into qped without physioball this session with improved weight bearing through [place]. He does fatigue requiring short seated rest breaks. Patient able to exhibit improved trunk control being able to sit edge of bed unsupported with supervision with less trunk lean and less loss of balance. He would benefit from additional skilled PT Intervention to improve trunk control working towards sitting unsupported to improve ADLs edge of bed; continue to progress transfers for better mobility;    Personal Factors and Comorbidities  Age;Comorbidity 3+    Comorbidities  Relevant past medical history and comorbidities include long term steroid use for lupus, CKD, chronic osteomyelitis, cervical spine stenosis, BPH, APS, alcohol abuse, peripheral artery disease, depression, GERD, obstructive sleep apnea, HTN, IBS, IgA deficiency, lumbar radiculopathy, RA, Stroke, toxic maculopathy in both eyes, systemic lupus erythematosus related syndrome, cardiac catheterization, carotid endarterectomy, cervical laminectomy, coronary angioplasty, Fusion C5-C7, knee arthroplasty, lumbar surgery.    Examination-Activity Limitations  Bed Mobility;Bend;Sit;Toileting;Stand;Stairs;Lift;Transfers;Squat;Locomotion Level;Carry;Dressing;Hygiene/Grooming;Continence     Examination-Participation Restrictions  Yard Work;Interpersonal Relationship;Community Activity    Stability/Clinical Decision Making  Evolving/Moderate complexity    Rehab Potential  Fair    PT Frequency  3x / week    PT Duration  12 weeks    PT Treatment/Interventions  ADLs/Self Care Home Management;Electrical Stimulation;Moist Heat;Cryotherapy;Gait training;Stair training;Functional mobility training;Therapeutic exercise;Balance training;Neuromuscular re-education;Cognitive remediation;Patient/family education;Orthotic Fit/Training;Wheelchair mobility training;Manual techniques;Passive range of motion;Energy conservation;Joint Manipulations    PT Next Visit Plan  Work on strength, sitting/standing balance, functional activities such as rolling, bed mobility, transfers    PT Home Exercise Plan  HEP handout provided via [facility] last week    Consulted and Agree with Plan of Care  Patient;Family member/caregiver    Family Member Consulted  daughter [name]       Patient will benefit from skilled therapeutic intervention in order to improve the following deficits and impairments:  Abnormal gait, Decreased activity tolerance, Decreased cognition, Decreased endurance, Decreased knowledge of use of DME, Decreased range of motion, Decreased skin integrity, Decreased strength, Impaired perceived functional ability, Impaired sensation, Impaired UE functional use, Improper body mechanics, Pain, Cardiopulmonary status limiting activity, Decreased balance, Decreased coordination, Decreased mobility, Difficulty walking, Impaired tone, Postural dysfunction  Visit Diagnosis: Muscle weakness (generalized)  Other lack of coordination  Difficulty in walking, not elsewhere classified  Problem List There are no active problems to display for this patient.   Ema Hebner PT, DPT 10/09/2018, 2:57 PM  [place] MAIN [facility] SERVICES [address] [place], [place],  [address] Phone: [phone]   Fax:  [phone]  Name: Kenneth Summers MRN: [id number] Date of Birth: 05-Mar-1945

## 2018-10-09 NOTE — Therapy (Signed)
[place] MAIN [facility] SERVICES [address] [place], [place], [address] Phone: [phone]   Fax:  [phone]  Occupational Therapy Treatment  Patient Details  Name: Kenneth Summers MRN: [id number] Date of Birth: 1945/04/02 No data recorded  Encounter Date: 10/09/2018  OT End of Session - 10/09/18 1501    Visit Number  46    Number of Visits  72    Date for OT Re-Evaluation  11/27/18    OT Start Time  1356    OT Stop Time  1430    OT Time Calculation (min)  34 min    Activity Tolerance  Patient tolerated treatment well    Behavior During Therapy  [facility] for tasks assessed/performed       Past Medical History:  Diagnosis Date  . Alcohol abuse   . APS (antiphospholipid syndrome) ([place])   . BPH (benign prostatic hyperplasia)   . CAD (coronary artery disease)   . Cellulitis   . Cervical spinal stenosis   . Chronic osteomyelitis ([place])   . CKD (chronic kidney disease)   . CKD (chronic kidney disease)   . Clostridium difficile diarrhea   . Depression   . Diplopia   . Fatigue   . GERD (gastroesophageal reflux disease)   . Hyperlipemia   . Hypertension   . Hypovitaminosis D   . IBS (irritable bowel syndrome)   . IgA deficiency ([place])   . Insomnia   . Left lumbosacral radiculopathy   . Moderate obstructive sleep apnea   . Osteomyelitis of foot ([place])   . PAD (peripheral artery disease) ([place])   . Pruritus   . RA (rheumatoid arthritis) ([place])   . Radiculopathy   . Restless legs syndrome   . RLS (restless legs syndrome)   . SLE (systemic lupus erythematosus related syndrome) ([place])   . Stroke ([place])   . Toxic maculopathy of both eyes     Past Surgical History:  Procedure Laterality Date  . CARDIAC CATHETERIZATION    . CAROTID ENDARTERECTOMY    . CERVICAL LAMINECTOMY    . CORONARY ANGIOPLASTY    . FRACTURE SURGERY    . fusion C5-6-7    . HERNIA REPAIR    . KNEE ARTHROSCOPY    . TONSILLECTOMY      There were no vitals filed for  this visit.  Subjective Assessment - 10/09/18 1501    Subjective   Pt. reports fatigue    Patient is accompanied by:  Family member    Pertinent History  Pt. is a 73 y.o. 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 Pt. PMHx includes: Multiple Falls, Lupus, DJD, Renal Abscess, CVA. Pt. resides with his wife. Pt.'s wife and daughter assist with ADLs. Pt. has caregivers in  for 2 hours a day, 6 days a week. Pt. received Rehab services in acute care, at SNF for STR, and [facility] services. Pt. is retired from [employer] for [employer] and [employer].    Currently in Pain?  No/denies      OT TREATMENT    Neuro muscular re-education:  Pt. worked on grasping 1" resistive cubes alternating thumb opposition to the tip of the 2nd digit while the board is placed at a vertical angle. Pt. worked on pressing the cubes back into place while alternating isolated 2nd, and 3rd digit. Pt. required extensive cues to use his LUE.   Selfcare:  Pt. and his daughter were assisted in problem-solving through  options for improving urinal use at home. Discussed toileting routine with the urinal, positioning, adaptive options for clothing.                          OT Education - 10/09/18 1501    Education Details  LUE functioning    Person(s) Educated  Patient    Methods  Explanation;Demonstration    Comprehension  Verbalized understanding;Returned demonstration;Verbal cues required          OT Long Term Goals - 09/25/18 1719      OT LONG TERM GOAL #1   Title  Pt. will increase left shoulder flexion AROM by 10 degrees to access cabinet/shelf.    Baseline  AROM left shoulder flexion: 108    Time  12    Period  Weeks    Status  On-going    Target Date  11/27/18      OT LONG TERM GOAL #2   Title  Pt. will donn a shirt with Supervision.    Baseline  Min-ModA    Time  12    Period  Weeks    Status  On-going    Target Date  11/27/18      OT LONG  TERM GOAL #3   Title  Pt. will require ModA to perform LE dressing    Baseline  MaxA left, ModA with the right    Time  12    Period  Weeks    Status  On-going    Target Date  11/27/18      OT LONG TERM GOAL #4   Title  Pt. will improve left grip strength by 10# to be able to open a jar/container.    Baseline  Left: 30, Right 40    Time  12    Period  Weeks    Status  On-going    Target Date  11/27/18      OT LONG TERM GOAL #5   Title  Pt. will improve left hand [facility] skills to be able to assit with buttoning/zipping.    Baseline  Left: 1 min & 50 sec. R: 56 sec.    Time  12    Period  Weeks    Status  New    Target Date  11/27/18      OT LONG TERM GOAL #6   Title  Pt. will demonstrate visual conmpensatory strategies during 100% of the time during ADLs    Baseline  Pt. requires min-mod for left side awareness for tabletop tasks in his near space, Max cues in the extrapersonal space    Time  12    Period  Weeks    Status  On-going    Target Date  11/27/18      OT LONG TERM GOAL #7   Title  Pt. will prepare a simple cold snack from w/c with supervision using cognitive compensatory strategies 100% of the time.    Baseline  Pt. is unable    Time  12    Period  Weeks    Status  On-going    Target Date  11/27/18      OT LONG TERM GOAL #8   Title  Pt. will accurately identify potential safety hazard using good safety awareness, and judgement 100% for ADLs, and IADLs.    Baseline  limited    Time  12    Period  Weeks    Status  On-going  Target Date  11/27/18      OT LONG TERM GOAL  #9   TITLE  Pt. will  navigate his w/c around obstacles with Supervision and 100% accuracy    Baseline  Pt. requires maxA, and heavy cuing for obstacle on the left    Time  12    Period  Weeks    Status  On-going    Target Date  11/27/18            Plan - 10/09/18 1502    Clinical Impression Statement  Pt. presented with increased fatigued with his LUE, requiring rest breaks, and  increased verbal cues for left sided awareness. Pt. continues to work on improving LUE functioning, attention and limited strength in order to improve ADL, and IADL funct    OT Occupational Profile and History  Problem Focused Assessment - Including review of records relating to presenting problem    Occupational performance deficits (Please refer to evaluation for details):  ADL's    Body Structure / Function / Physical Skills  UE functional use;Coordination;FMC;Dexterity;Strength;ROM    Cognitive Skills  Attention;Memory;Emotional;Problem Solve;Safety Awareness    Rehab Potential  Good    Clinical Decision Making  Several treatment options, min-mod task modification necessary    Comorbidities Affecting Occupational Performance:  Presence of comorbidities impacting occupational performance    Modification or Assistance to Complete Evaluation   No modification of tasks or assist necessary to complete eval    OT Frequency  3x / week    OT Duration  12 weeks    OT Treatment/Interventions  Self-care/ADL training;DME and/or AE instruction;Therapeutic exercise;Therapeutic activities;Moist Heat;Cognitive remediation/compensation;Neuromuscular education;Visual/perceptual remediation/compensation;Coping strategies training;Patient/family education;Passive range of motion;Psychosocial skills training;Energy conservation;Functional Mobility Training    Consulted and Agree with Plan of Care  Patient    Family Member Consulted  Daughter [name]       Patient will benefit from skilled therapeutic intervention in order to improve the following deficits and impairments:   Body Structure / Function / Physical Skills: UE functional use, Coordination, FMC, Dexterity, Strength, ROM Cognitive Skills: Attention, Memory, Emotional, Problem Solve, Safety Awareness     Visit Diagnosis: Muscle weakness (generalized)  Other lack of coordination    Problem List There are no active problems to display for this  patient.   Harrel Carina, MS, OTR/L 10/09/2018, 3:13 PM  [place] MAIN [facility] SERVICES [address] [place], [place], [address] Phone: [phone]   Fax:  [phone]  Name: Kenneth Summers MRN: [id number] Date of Birth: [birth year]

## 2018-10-16 ENCOUNTER — Other Ambulatory Visit: Payer: Self-pay

## 2018-10-16 ENCOUNTER — Encounter: Payer: Self-pay | Admitting: Physical Therapy

## 2018-10-16 ENCOUNTER — Encounter: Payer: Self-pay | Admitting: Occupational Therapy

## 2018-10-16 ENCOUNTER — Ambulatory Visit: Payer: Medicare Other | Admitting: Physical Therapy

## 2018-10-16 ENCOUNTER — Ambulatory Visit: Payer: Medicare Other | Admitting: Occupational Therapy

## 2018-10-16 DIAGNOSIS — A419 Sepsis, unspecified organism: Secondary | ICD-10-CM | POA: Diagnosis not present

## 2018-10-16 DIAGNOSIS — M6281 Muscle weakness (generalized): Secondary | ICD-10-CM

## 2018-10-16 DIAGNOSIS — R278 Other lack of coordination: Secondary | ICD-10-CM

## 2018-10-16 DIAGNOSIS — R05 Cough: Secondary | ICD-10-CM | POA: Diagnosis not present

## 2018-10-16 DIAGNOSIS — R262 Difficulty in walking, not elsewhere classified: Secondary | ICD-10-CM

## 2018-10-16 NOTE — Therapy (Signed)
[place] MAIN [facility] SERVICES [address] [place], [place], [address] Phone: [phone]   Fax:  [phone]  Physical Therapy Treatment  Patient Details  Name: Kenneth Summers MRN: [id number] Date of Birth: 1946-01-29 Referring Provider (PT): Dr. [name], Guy Begin   Encounter Date: 10/16/2018  PT End of Session - 10/16/18 1402    Visit Number  48    Number of Visits  22    Date for PT Re-Evaluation  12/04/18    Authorization Type  Medicare reporting period starting 09/11/18    Authorization Time Period  Current cert period 06/11/9739-63/84/53    PT Start Time  1302    PT Stop Time  1345    PT Time Calculation (min)  43 min    Equipment Utilized During Treatment  Gait belt    Activity Tolerance  Patient limited by fatigue    Behavior During Therapy  [facility] for tasks assessed/performed       Past Medical History:  Diagnosis Date  . Alcohol abuse   . APS (antiphospholipid syndrome) ([place])   . BPH (benign prostatic hyperplasia)   . CAD (coronary artery disease)   . Cellulitis   . Cervical spinal stenosis   . Chronic osteomyelitis ([place])   . CKD (chronic kidney disease)   . CKD (chronic kidney disease)   . Clostridium difficile diarrhea   . Depression   . Diplopia   . Fatigue   . GERD (gastroesophageal reflux disease)   . Hyperlipemia   . Hypertension   . Hypovitaminosis D   . IBS (irritable bowel syndrome)   . 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 Insomnia   . Left lumbosacral radiculopathy   . Moderate obstructive sleep apnea   . Osteomyelitis of foot ([place])   . PAD (peripheral artery disease) ([place])   . Pruritus   . RA (rheumatoid arthritis) ([place])   . Radiculopathy   . Restless legs syndrome   . RLS (restless legs syndrome)   . SLE (systemic lupus erythematosus related syndrome) ([place])   . Stroke ([place])   . Toxic maculopathy of both eyes     Past Surgical History:  Procedure Laterality Date  . CARDIAC CATHETERIZATION    . CAROTID  ENDARTERECTOMY    . CERVICAL LAMINECTOMY    . CORONARY ANGIOPLASTY    . FRACTURE SURGERY    . fusion C5-6-7    . HERNIA REPAIR    . KNEE ARTHROSCOPY    . TONSILLECTOMY      There were no vitals filed for this visit.  Subjective Assessment - 10/16/18 1401    Subjective  Patient reports doing well. He reports trying to sit on edge of bed at home with success. His daughter reports that they need to practice it more.    Patient is accompained by:  Family member    Pertinent History  Patient is a 73 y.o. male who presents to outpatient physical therapy with a referral for medical diagnosis of CVA. This patient's chief complaints consist of left hemiplegia and overall deconditioning leading to the following functional deficits: dependent for ADLs, IADLs, unable to transfer to car, dependent transfers at home with hoyer lift, unable to walk or stand without significant assistance, difficulty with W/C navigation..    Limitations  Sitting;Lifting;Standing;House hold activities;Writing;Walking;Other (comment)    Diagnostic tests  Brain MRI 11/01/2017: IMPRESSION: 1. Acute right ACA territory infarct as demonstrated on prior CT imaging. No intracranial hemorrhage or significant mass effect. 2. Age  advanced global brain atrophy and small chronic right occipital Infarct.    Patient Stated Goals  wants to be able to walk again    Currently in Pain?  No/denies    Pain Onset  Today    Multiple Pain Sites  No       TREATMENT:  Transferred wheelchair<>mat table mod A +2; patient required increased cues for proper positioning and increased time to prepare to transfer;  Transitioned to supine, minA +2with assistance for lifting LLE and cues for proper positioning; Rolled to prone mod A +2, required increased time and mod VCs for UE positioning and assistance for LE positioning; Transitioned to qped max A +3(no physioball used) Cues for erect posture, weight bearing through UE and  increase upper trunk control x2-3 min; patient able to hold self up into qped with supervision in neutral position x30 sec without physioball with good weight bearing in BUE/BLE; Initially patient exhibits heavy right side lean requiring cues for weight shift to neutral position. He also fatigue quickly often wanted to collapse onto elbows due to UE weakness;   Qped: forward/backward weight shift x5 reps with min A +2 for balance and mod VCs for proper positioning and trunk activation;  Transitioned to sitting edge of mat table; Patient required CGA and cues for weight shift for neutral position; He exhibits heavy left lateral shift upon sitting; Concerned this is related to weakness and fatigue; He required increased verbal and visual cues for proper weight shift; Instructed patient in seated exercise: -Anterior/posterior pelvic rocks x5 reps; Despite cues for pelvic mobility, patient continues to exhibit heavy left lateral lean;   Transferred sit<>stand with RW  mod A +2 with mod-max VCs for proper hand placement; initially patient exhibits better forward weight shift; He was able to stand with improved erect posture and better trunk extension but requires cues for forward weight shift for better stance control; completed 1 sit<> stand transfers for 40 seconds.  Transferred mat table to wheelchair modA+2with increased time required and cues for weight shift for proper positioning;    Response to treatment: patient tolerated fair; He was more fatigued this session with increased left lateral trunk lean and decreased trunk control. He required increased assistance for transitioning into qped position due to UE/LE weakness; He denies any increase in pain at end of session;                          PT Education - 10/16/18 1402    Education Details  transfers/positioning;    Person(s) Educated  Patient;Child(ren)    Methods  Explanation;Verbal cues     Comprehension  Verbalized understanding;Returned demonstration;Verbal cues required;Need further instruction       PT Short Term Goals - 09/11/18 1704      PT SHORT TERM GOAL #1   Title  Be independent with initial home exercise program for self-management of symptoms.    Baseline  HEP to be given at second session; advice to practice unsupported sitting at home (06/19/2018); 07/08/18: Performing at home, 6/29: needs assistance but is doing exercise program regularly; 08/22/18: adherent;    Time  2    Period  Weeks    Status  Achieved    Target Date  06/26/18        PT Long Term Goals - 09/11/18 1704      PT LONG TERM GOAL #1   Title  Patient will complete all bed mobility with min A to  improve functional independence for getting in and out of bed and adjusting in bed.     Baseline  max A - total A reported by family (06/19/2018); 07/08/18: still requires maxA, dtr reports improvement in rolling since starting therapy; 6/23: min A to supervision in clinic; not doing at home right; 8/5: Pt's daughter states that his rolling has improved, but still has difficulty with all other bed mobility    Time  12    Period  Weeks    Status  Partially Met    Target Date  12/04/18      PT LONG TERM GOAL #2   Title  Patient will complete sit <> stand transfer chair to chair with Min A and LRAD to improve functional independence for household and community mobility.     Baseline  max A +2 STS at W/C (06/12/2018); max A +2-3 STS or stand pivot transfer, able to complete more reps (06/19/2018); 07/08/18: not safe to attempt at this time; 09/11/18: requires maxA+1 or modA+2 for STSs and squat pivot transfer    Time  12    Period  Weeks    Status  On-going    Target Date  12/04/18      PT LONG TERM GOAL #3   Title  Patient will navigate manual w/c with min A x 100 feet to improve mobility for household and short community distances.     Baseline  required total A (06/12/2018); able to roll 20 feet with min A  (06/20/2018); 07/08/18: Pt can go approximately 10' without assist per family;    Time  12    Period  Weeks    Status  On-going    Target Date  12/04/18      PT LONG TERM GOAL #4   Title  Patient will complete car transfers W/C to family SUV with min A using LRAD to improve his abilty to participate in community activities.     Baseline  unable to complete car transfers at all (06/12/2018); unable to complete car transfers at all (06/19/2018).; 07/08/18: Unable to perform at this time; 09/11/18: unable to perform at this time    Time  12    Period  Weeks    Status  On-going    Target Date  12/04/18      PT LONG TERM GOAL #5   Title  Patient will ambulate at least 150 feet with mod I using LRAD to improve mobility for household and community distances.     Baseline  unable to take steps (06/12/2018); able to shift R leg forward and back with max A and RW at edge of plinth (06/19/2018); 07/08/18: unable to ambulate at this time. 09/11/18: unable to perform at this time    Time  12    Period  Weeks    Status  On-going    Target Date  12/04/18      Additional Long Term Goals   Additional Long Term Goals  Yes      PT LONG TERM GOAL #6   Title  Patient will navigate 4 steps with min A and BUE support to improve his ability to access community and social participation.     Baseline  unable to complet stairs (06/12/2018); unable to complete stairs (06/19/2018); 07/08/18: unable to attempt at this time; 09/11/18: unsafe to attempt at this time    Time  12    Period  Weeks    Status  On-going    Target Date  12/04/18      PT LONG TERM GOAL #7   Title  Patient will increase functional reach test to >15 inches in sitting to exhibit improved sitting balance and positioning and reduce fall risk;    Baseline  07/30/18: 12 inches; 09/11/18: 10-12 inches    Time  4    Period  Weeks    Status  Partially Met    Target Date  12/04/18            Plan - 10/16/18 1402    Clinical Impression Statement  Patient  tolerated session fair. He exhibits increased left lateral lean upon sitting edge of bed at end of mat session having difficulty with neutral weight shift. Patient was able to achieve qped with increased assistance and heavy cues for positioning and weight shift. He reports increased fatigue at end of session. Patient would benefit from additional skilled PT Intervention to improve strength, balance and mobility;    Personal Factors and Comorbidities  Age;Comorbidity 3+    Comorbidities  Relevant past medical history and comorbidities include long term steroid use for lupus, CKD, chronic osteomyelitis, cervical spine stenosis, BPH, APS, alcohol abuse, peripheral artery disease, depression, GERD, obstructive sleep apnea, HTN, IBS, IgA deficiency, lumbar radiculopathy, RA, Stroke, toxic maculopathy in both eyes, systemic lupus erythematosus related syndrome, cardiac catheterization, carotid endarterectomy, cervical laminectomy, coronary angioplasty, Fusion C5-C7, knee arthroplasty, lumbar surgery.    Examination-Activity Limitations  Bed Mobility;Bend;Sit;Toileting;Stand;Stairs;Lift;Transfers;Squat;Locomotion Level;Carry;Dressing;Hygiene/Grooming;Continence    Examination-Participation Restrictions  Yard Work;Interpersonal Relationship;Community Activity    Stability/Clinical Decision Making  Evolving/Moderate complexity    Rehab Potential  Fair    PT Frequency  3x / week    PT Duration  12 weeks    PT Treatment/Interventions  ADLs/Self Care Home Management;Electrical Stimulation;Moist Heat;Cryotherapy;Gait training;Stair training;Functional mobility training;Therapeutic exercise;Balance training;Neuromuscular re-education;Cognitive remediation;Patient/family education;Orthotic Fit/Training;Wheelchair mobility training;Manual techniques;Passive range of motion;Energy conservation;Joint Manipulations    PT Next Visit Plan  Work on strength, sitting/standing balance, functional activities such as rolling, bed  mobility, transfers    PT Home Exercise Plan  HEP handout provided via Beth last week    Consulted and Agree with Plan of Care  Patient;Family member/caregiver    Family Member Consulted  daughter [name]       Patient will benefit from skilled therapeutic intervention in order to improve the following deficits and impairments:  Abnormal gait, Decreased activity tolerance, Decreased cognition, Decreased endurance, Decreased knowledge of use of DME, Decreased range of motion, Decreased skin integrity, Decreased strength, Impaired perceived functional ability, Impaired sensation, Impaired UE functional use, Improper body mechanics, Pain, Cardiopulmonary status limiting activity, Decreased balance, Decreased coordination, Decreased mobility, Difficulty walking, Impaired tone, Postural dysfunction  Visit Diagnosis: Muscle weakness (generalized)  Other lack of coordination  Difficulty in walking, not elsewhere classified     Problem List There are no active problems to display for this patient.   Rosendo Couser PT, DPT 10/16/2018, 2:03 PM  [place] MAIN [facility] SERVICES [address] [place], [place], [address] Phone: [phone]   Fax:  [phone]  Name: Kenneth Summers MRN: [id number] Date of Birth: [birth year]

## 2018-10-16 NOTE — Therapy (Signed)
[place] MAIN [facility] SERVICES [address] [place], [place], [address] Phone: [phone]   Fax:  [phone]  Occupational Therapy Treatment  Patient Details  Name: Kenneth Summers MRN: [id number] Date of Birth: 1945-07-13 No data recorded  Encounter Date: 10/16/2018  OT End of Session - 10/16/18 1654    Visit Number  81    Number of Visits  73    Date for OT Re-Evaluation  11/27/18    Authorization Type  Progress report period starting 09/26/2018    OT Start Time  1350    OT Stop Time  1430    OT Time Calculation (min)  40 min    Activity Tolerance  Patient tolerated treatment well    Behavior During Therapy  [facility] for tasks assessed/performed       Past Medical History:  Diagnosis Date  . Alcohol abuse   . APS (antiphospholipid syndrome) ([place])   . BPH (benign prostatic hyperplasia)   . CAD (coronary artery disease)   . Cellulitis   . Cervical spinal stenosis   . Chronic osteomyelitis ([place])   . CKD (chronic kidney disease)   . CKD (chronic kidney disease)   . Clostridium difficile diarrhea   . Depression   . Diplopia   . Fatigue   . GERD (gastroesophageal reflux disease)   . Hyperlipemia   . Hypertension   . Hypovitaminosis D   . IBS (irritable bowel syndrome)   . IgA deficiency ([place])   . Insomnia   . Left lumbosacral radiculopathy   . Moderate obstructive sleep apnea   . Osteomyelitis of foot ([place])   . PAD (peripheral artery disease) ([place])   . Pruritus   . RA (rheumatoid arthritis) ([place])   . Radiculopathy   . Restless legs syndrome   . RLS (restless legs syndrome)   . SLE (systemic lupus erythematosus related syndrome) ([place])   . Stroke ([place])   . Toxic maculopathy of both eyes     Past Surgical History:  Procedure Laterality Date  . CARDIAC CATHETERIZATION    . CAROTID ENDARTERECTOMY    . CERVICAL LAMINECTOMY    . CORONARY ANGIOPLASTY    . FRACTURE SURGERY    . fusion C5-6-7    . HERNIA REPAIR    . KNEE  ARTHROSCOPY    . TONSILLECTOMY      There were no vitals filed for this visit.  Subjective Assessment - 10/16/18 1653    Subjective   Pt    Patient is accompanied by:  Family member    Pertinent History  Pt. is a 73 y.o. 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 Pt. PMHx includes: Multiple Falls, Lupus, DJD, Renal Abscess, CVA. Pt. resides with his wife. Pt.'s wife and daughter assist with ADLs. Pt. has caregivers in  for 2 hours a day, 6 days a week. Pt. received Rehab services in acute care, at SNF for STR, and [facility] services. Pt. is retired from [employer] for [employer] and [employer].    Currently in Pain?  No/denies      OT TREATMENT    Neuro muscular re-education:  Pt. worked on grasping 2" large pegs from a container placed at the left side, and placing them onto the [employer] placed at a tabletop surface following a jumbo [employer] pattern. Pt. had difficulty following a design in the right upper quadrant. Pt. required increased time, rest breaks, verbal, and tactile cues  to complete the task, and use his left hand.                         OT Education - 10/16/18 1654    Education Details  LUE functioning    Person(s) Educated  Patient    Methods  Explanation;Demonstration    Comprehension  Verbalized understanding;Returned demonstration;Verbal cues required          OT Long Term Goals - 09/25/18 1719      OT LONG TERM GOAL #1   Title  Pt. will increase left shoulder flexion AROM by 10 degrees to access cabinet/shelf.    Baseline  AROM left shoulder flexion: 108    Time  12    Period  Weeks    Status  On-going    Target Date  11/27/18      OT LONG TERM GOAL #2   Title  Pt. will donn a shirt with Supervision.    Baseline  Min-ModA    Time  12    Period  Weeks    Status  On-going    Target Date  11/27/18      OT LONG TERM GOAL #3   Title  Pt. will require ModA to perform LE dressing     Baseline  MaxA left, ModA with the right    Time  12    Period  Weeks    Status  On-going    Target Date  11/27/18      OT LONG TERM GOAL #4   Title  Pt. will improve left grip strength by 10# to be able to open a jar/container.    Baseline  Left: 30, Right 40    Time  12    Period  Weeks    Status  On-going    Target Date  11/27/18      OT LONG TERM GOAL #5   Title  Pt. will improve left hand [facility] skills to be able to assit with buttoning/zipping.    Baseline  Left: 1 min & 50 sec. R: 56 sec.    Time  12    Period  Weeks    Status  New    Target Date  11/27/18      OT LONG TERM GOAL #6   Title  Pt. will demonstrate visual conmpensatory strategies during 100% of the time during ADLs    Baseline  Pt. requires min-mod for left side awareness for tabletop tasks in his near space, Max cues in the extrapersonal space    Time  12    Period  Weeks    Status  On-going    Target Date  11/27/18      OT LONG TERM GOAL #7   Title  Pt. will prepare a simple cold snack from w/c with supervision using cognitive compensatory strategies 100% of the time.    Baseline  Pt. is unable    Time  12    Period  Weeks    Status  On-going    Target Date  11/27/18      OT LONG TERM GOAL #8   Title  Pt. will accurately identify potential safety hazard using good safety awareness, and judgement 100% for ADLs, and IADLs.    Baseline  limited    Time  12    Period  Weeks    Status  On-going    Target Date  11/27/18      OT  LONG TERM GOAL  #9   TITLE  Pt. will  navigate his w/c around obstacles with Supervision and 100% accuracy    Baseline  Pt. requires maxA, and heavy cuing for obstacle on the left    Time  12    Period  Weeks    Status  On-going    Target Date  11/27/18            Plan - 10/16/18 1655    Clinical Impression Statement  Pt. and daughter were educated about adaptive spill proof urinals, and adpative clothing/pants. A visual handout was provided to the pt., and his  daughter. Pt. continues to work on improving LUE strength, and motor control skills. Pt. continues to work on LUE functioning, left sided awareness, visual attention in order to improve  ADL, and IADL functioning.    OT Occupational Profile and History  Problem Focused Assessment - Including review of records relating to presenting problem    Occupational performance deficits (Please refer to evaluation for details):  ADL's    Body Structure / Function / Physical Skills  UE functional use;Coordination;FMC;Dexterity;Strength;ROM    Cognitive Skills  Attention;Memory;Emotional;Problem Solve;Safety Awareness    Rehab Potential  Good    Clinical Decision Making  Several treatment options, min-mod task modification necessary    Comorbidities Affecting Occupational Performance:  Presence of comorbidities impacting occupational performance    Comorbidities impacting occupational performance description:  Phyical, cognitive, visual,  medical comorbidities    Modification or Assistance to Complete Evaluation   No modification of tasks or assist necessary to complete eval    OT Frequency  3x / week    OT Duration  12 weeks    OT Treatment/Interventions  Self-care/ADL training;DME and/or AE instruction;Therapeutic exercise;Therapeutic activities;Moist Heat;Cognitive remediation/compensation;Neuromuscular education;Visual/perceptual remediation/compensation;Coping strategies training;Patient/family education;Passive range of motion;Psychosocial skills training;Energy conservation;Functional Mobility Training    Consulted and Agree with Plan of Care  Patient    Family Member Consulted  Daughter [name]       Patient will benefit from skilled therapeutic intervention in order to improve the following deficits and impairments:   Body Structure / Function / Physical Skills: UE functional use, Coordination, FMC, Dexterity, Strength, ROM Cognitive Skills: Attention, Memory, Emotional, Problem Solve, Safety  Awareness     Visit Diagnosis: Other lack of coordination  Muscle weakness (generalized)    Problem List There are no active problems to display for this patient.   Harrel Carina, MS, OTR/L 10/16/2018, 5:10 PM  [place] MAIN [facility] SERVICES [address] [place], [place], [address] Phone: [phone]   Fax:  [phone]  Name: Kenneth Summers MRN: [id number] Date of Birth: 01/15/1946

## 2018-10-17 ENCOUNTER — Encounter: Payer: Self-pay | Admitting: Occupational Therapy

## 2018-10-17 ENCOUNTER — Ambulatory Visit: Payer: Medicare Other | Admitting: Occupational Therapy

## 2018-10-17 ENCOUNTER — Other Ambulatory Visit: Payer: Self-pay

## 2018-10-17 ENCOUNTER — Ambulatory Visit: Payer: Medicare Other

## 2018-10-17 DIAGNOSIS — M6281 Muscle weakness (generalized): Secondary | ICD-10-CM

## 2018-10-17 DIAGNOSIS — R278 Other lack of coordination: Secondary | ICD-10-CM

## 2018-10-17 DIAGNOSIS — R262 Difficulty in walking, not elsewhere classified: Secondary | ICD-10-CM

## 2018-10-17 DIAGNOSIS — R41841 Cognitive communication deficit: Secondary | ICD-10-CM

## 2018-10-17 DIAGNOSIS — H547 Unspecified visual loss: Secondary | ICD-10-CM

## 2018-10-17 NOTE — Therapy (Signed)
[place] MAIN [facility] SERVICES [address] [place], [place], [address] Phone: [phone]   Fax:  [phone]  Physical Therapy Treatment  Patient Details  Name: Kenneth Summers MRN: [id number] Date of Birth: [birth year] Referring Provider (PT): Dr. [name], Guy Begin   Encounter Date: 10/17/2018  PT End of Session - 10/17/18 1402    Visit Number  31    Number of Visits  25    Date for PT Re-Evaluation  12/04/18    Authorization Type  Medicare reporting period starting 09/11/18    Authorization Time Period  Current cert period 10/08/6382-66/59/93    PT Start Time  1430    PT Stop Time  1515    PT Time Calculation (min)  45 min    Equipment Utilized During Treatment  Gait belt    Activity Tolerance  Patient limited by fatigue    Behavior During Therapy  [facility] for tasks assessed/performed       Past Medical History:  Diagnosis Date  . Alcohol abuse   . APS (antiphospholipid syndrome) ([place])   . BPH (benign prostatic hyperplasia)   . CAD (coronary artery disease)   . Cellulitis   . Cervical spinal stenosis   . Chronic osteomyelitis ([place])   . CKD (chronic kidney disease)   . CKD (chronic kidney disease)   . Clostridium difficile diarrhea   . Depression   . Diplopia   . Fatigue   . GERD (gastroesophageal reflux disease)   . Hyperlipemia   . Hypertension   . Hypovitaminosis D   . IBS (irritable bowel syndrome)   . 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 Insomnia   . Left lumbosacral radiculopathy   . Moderate obstructive sleep apnea   . Osteomyelitis of foot ([place])   . PAD (peripheral artery disease) ([place])   . Pruritus   . RA (rheumatoid arthritis) ([place])   . Radiculopathy   . Restless legs syndrome   . RLS (restless legs syndrome)   . SLE (systemic lupus erythematosus related syndrome) ([place])   . Stroke ([place])   . Toxic maculopathy of both eyes     Past Surgical History:  Procedure Laterality Date  . CARDIAC CATHETERIZATION    . CAROTID  ENDARTERECTOMY    . CERVICAL LAMINECTOMY    . CORONARY ANGIOPLASTY    . FRACTURE SURGERY    . fusion C5-6-7    . HERNIA REPAIR    . KNEE ARTHROSCOPY    . TONSILLECTOMY      There were no vitals filed for this visit.  Subjective Assessment - 10/17/18 1434    Subjective  Patient reports doing well.  He reports no pain or soreness today.  His daughter reports that he took a medication last night with sedative effects, and that he has been fatigued today.  No new questions or concerns.    Patient is accompained by:  Family member    Pertinent History  Patient is a 73 y.o. male who presents to outpatient physical therapy with a referral for medical diagnosis of CVA. This patient's chief complaints consist of left hemiplegia and overall deconditioning leading to the following functional deficits: dependent for ADLs, IADLs, unable to transfer to car, dependent transfers at home with hoyer lift, unable to walk or stand without significant assistance, difficulty with W/C navigation..    Limitations  Sitting;Lifting;Standing;House hold activities;Writing;Walking;Other (comment)    Diagnostic tests  Brain MRI 11/01/2017: IMPRESSION: 1. Acute right ACA territory infarct as demonstrated on  prior CT imaging. No intracranial hemorrhage or significant mass effect. 2. 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    Patient Stated Goals  wants to be able to walk again    Pain Onset  Today       TREATMENT:    Transferred wheelchair<>mat table mod A +2; patient required increased cues for proper positioning and increased time to prepare to transfer;     Sitting edge of mat pt demonstrated a heavy L lateral lean initially, so a wedge was placed under his L hip, which improved his posture.  x10 hip flexion in sitting performed bilaterally.  Assistance required on L side to raise leg off of ground, but trace muscle activation was noted.  Ball roll outs while sitting edge of mat  x5 forward, and x5 laterally to each side; CGA provided throughout behind pt  While sitting edge of mat, pt cued to reach to various, randomized spots provided by therapist encouraging him to reach forward, laterally, and up to promote forward and lateral weight shifting while maintaining core stability.  CGA to minA provided throughout to prevent LOB.  Transitioned to supine, min A +2 with assistance for lifting LLE and cues for proper positioning;    Rolling R x5 with external cueing provided to reach for therapists hand to encourage hip rotation coming into sidelying.  Pt is able to roll R without assistance at hips, but requires the visual cueing of reaching for the therapists hand.  Rolling L x5 requiring min/modA at hips to assist with hip rotation.  Pt still requires external cue of outstretched arm to reach for a target to promote rolling.  L Knee flexion and extension in supine x10   L supine manual resisted leg press x5    Transferred sit<>stand with RW mod A +2 with mod-max VCs for proper hand placement; initially patient exhibits better forward weight shift; He was able to stand with improved erect posture and better trunk extension but requires cues for forward weight shift for better stance control; completed 2 sit<> stand transfers for 30 seconds during the first set and 15 sec during 2nd set.  Transferred mat table to wheelchair mod A +2 with increased time required and cues for weight shift for proper positioning;         Response to treatment: Patient tolerated fair; His daughter reports that he took a medication with sedative effects last night, and pt appears to still be fatigued throughout his session today.  He demonstrated the ability to roll R with external cueing of the therapists outstretched hand to assist with hip rotation, but requires min/modA at hips when rolling L. Patient would benefit from additional skilled PT Intervention to improve strength, balance and mobility            PT Short Term Goals - 09/11/18 1704      PT SHORT TERM GOAL #1   Title  Be independent with initial home exercise program for self-management of symptoms.    Baseline  HEP to be given at second session; advice to practice unsupported sitting at home (06/19/2018); 07/08/18: Performing at home, 6/29: needs assistance but is doing exercise program regularly; 08/22/18: adherent;    Time  2    Period  Weeks    Status  Achieved    Target Date  06/26/18        PT Long Term Goals - 09/11/18 1704      PT LONG TERM GOAL #1  Title  Patient will complete all bed mobility with min A to improve functional independence for getting in and out of bed and adjusting in bed.     Baseline  max A - total A reported by family (06/19/2018); 07/08/18: still requires maxA, dtr reports improvement in rolling since starting therapy; 6/23: min A to supervision in clinic; not doing at home right; 8/5: Pt's daughter states that his rolling has improved, but still has difficulty with all other bed mobility    Time  12    Period  Weeks    Status  Partially Met    Target Date  12/04/18      PT LONG TERM GOAL #2   Title  Patient will complete sit <> stand transfer chair to chair with Min A and LRAD to improve functional independence for household and community mobility.     Baseline  max A +2 STS at W/C (06/12/2018); max A +2-3 STS or stand pivot transfer, able to complete more reps (06/19/2018); 07/08/18: not safe to attempt at this time; 09/11/18: requires maxA+1 or modA+2 for STSs and squat pivot transfer    Time  12    Period  Weeks    Status  On-going    Target Date  12/04/18      PT LONG TERM GOAL #3   Title  Patient will navigate manual w/c with min A x 100 feet to improve mobility for household and short community distances.     Baseline  required total A (06/12/2018); able to roll 20 feet with min A (06/20/2018); 07/08/18: Pt can go approximately 10' without assist per family;    Time  12    Period  Weeks     Status  On-going    Target Date  12/04/18      PT LONG TERM GOAL #4   Title  Patient will complete car transfers W/C to family SUV with min A using LRAD to improve his abilty to participate in community activities.     Baseline  unable to complete car transfers at all (06/12/2018); unable to complete car transfers at all (06/19/2018).; 07/08/18: Unable to perform at this time; 09/11/18: unable to perform at this time    Time  12    Period  Weeks    Status  On-going    Target Date  12/04/18      PT LONG TERM GOAL #5   Title  Patient will ambulate at least 150 feet with mod I using LRAD to improve mobility for household and community distances.     Baseline  unable to take steps (06/12/2018); able to shift R leg forward and back with max A and RW at edge of plinth (06/19/2018); 07/08/18: unable to ambulate at this time. 09/11/18: unable to perform at this time    Time  12    Period  Weeks    Status  On-going    Target Date  12/04/18      Additional Long Term Goals   Additional Long Term Goals  Yes      PT LONG TERM GOAL #6   Title  Patient will navigate 4 steps with min A and BUE support to improve his ability to access community and social participation.     Baseline  unable to complet stairs (06/12/2018); unable to complete stairs (06/19/2018); 07/08/18: unable to attempt at this time; 09/11/18: unsafe to attempt at this time    Time  12    Period  Weeks    Status  On-going    Target Date  12/04/18      PT LONG TERM GOAL #7   Title  Patient will increase functional reach test to >15 inches in sitting to exhibit improved sitting balance and positioning and reduce fall risk;    Baseline  07/30/18: 12 inches; 09/11/18: 10-12 inches    Time  4    Period  Weeks    Status  Partially Met    Target Date  12/04/18            Plan - 10/17/18 1711    Clinical Impression Statement  Patient tolerated fair; His daughter reports that he took a medication with sedative effects last night, and pt appears to  still be fatigued throughout his session today.  He demonstrated the ability to roll R with external cueing of the therapists outstretched hand to assist with hip rotation, but requires min/modA at hips when rolling L. Patient would benefit from additional skilled PT Intervention to improve strength, balance and mobility    Personal Factors and Comorbidities  Age;Comorbidity 3+    Comorbidities  Relevant past medical history and comorbidities include long term steroid use for lupus, CKD, chronic osteomyelitis, cervical spine stenosis, BPH, APS, alcohol abuse, peripheral artery disease, depression, GERD, obstructive sleep apnea, HTN, IBS, IgA deficiency, lumbar radiculopathy, RA, Stroke, toxic maculopathy in both eyes, systemic lupus erythematosus related syndrome, cardiac catheterization, carotid endarterectomy, cervical laminectomy, coronary angioplasty, Fusion C5-C7, knee arthroplasty, lumbar surgery.    Examination-Activity Limitations  Bed Mobility;Bend;Sit;Toileting;Stand;Stairs;Lift;Transfers;Squat;Locomotion Level;Carry;Dressing;Hygiene/Grooming;Continence    Examination-Participation Restrictions  Yard Work;Interpersonal Relationship;Community Activity    Stability/Clinical Decision Making  Evolving/Moderate complexity    Rehab Potential  Fair    PT Frequency  3x / week    PT Duration  12 weeks    PT Treatment/Interventions  ADLs/Self Care Home Management;Electrical Stimulation;Moist Heat;Cryotherapy;Gait training;Stair training;Functional mobility training;Therapeutic exercise;Balance training;Neuromuscular re-education;Cognitive remediation;Patient/family education;Orthotic Fit/Training;Wheelchair mobility training;Manual techniques;Passive range of motion;Energy conservation;Joint Manipulations    PT Next Visit Plan  Outcome measures, goals, progress note; Work on strength, sitting/standing balance, functional activities such as rolling, bed mobility, transfers    PT [place]  handout provided via Beth last week    Consulted and Agree with Plan of Care  Patient;Family member/caregiver    Family Member Consulted  daughter [name]       Patient will benefit from skilled therapeutic intervention in order to improve the following deficits and impairments:  Abnormal gait, Decreased activity tolerance, Decreased cognition, Decreased endurance, Decreased knowledge of use of DME, Decreased range of motion, Decreased skin integrity, Decreased strength, Impaired perceived functional ability, Impaired sensation, Impaired UE functional use, Improper body mechanics, Pain, Cardiopulmonary status limiting activity, Decreased balance, Decreased coordination, Decreased mobility, Difficulty walking, Impaired tone, Postural dysfunction  Visit Diagnosis: Other lack of coordination  Muscle weakness (generalized)  Difficulty in walking, not elsewhere classified     Problem List There are no active problems to display for this patient.   This entire session was performed under direct supervision and direction of a licensed therapist/therapist assistant . I have personally read, edited and approve of the note as written.   Lutricia Horsfall, SPT Phillips Grout PT, DPT, GCS  Huprich,Jason 10/18/2018, 9:32 AM  [place] MAIN [facility] SERVICES [address] [place], [place], [address] Phone: [phone]   Fax:  [phone]  Name: DEIONTAE RABEL MRN: [id number] Date of Birth: 1946-01-01

## 2018-10-17 NOTE — Therapy (Signed)
[place] MAIN [facility] SERVICES [address] [place], [place], [address] Phone: [phone]   Fax:  [phone]  Occupational Therapy Treatment  Patient Details  Name: Kenneth Summers MRN: [id number] Date of Birth: 1945-03-20 No data recorded  Encounter Date: 10/17/2018  OT End of Session - 10/17/18 1518    Visit Number  48    Number of Visits  21    Date for OT Re-Evaluation  11/27/18    Authorization Type  Progress report period starting 09/26/2018    OT Start Time  1355    OT Stop Time  1430    OT Time Calculation (min)  35 min    Activity Tolerance  Patient tolerated treatment well    Behavior During Therapy  [facility] for tasks assessed/performed       Past Medical History:  Diagnosis Date  . Alcohol abuse   . APS (antiphospholipid syndrome) ([place])   . BPH (benign prostatic hyperplasia)   . CAD (coronary artery disease)   . Cellulitis   . Cervical spinal stenosis   . Chronic osteomyelitis ([place])   . CKD (chronic kidney disease)   . CKD (chronic kidney disease)   . Clostridium difficile diarrhea   . Depression   . Diplopia   . Fatigue   . GERD (gastroesophageal reflux disease)   . Hyperlipemia   . Hypertension   . Hypovitaminosis D   . IBS (irritable bowel syndrome)   . IgA deficiency ([place])   . Insomnia   . Left lumbosacral radiculopathy   . Moderate obstructive sleep apnea   . Osteomyelitis of foot ([place])   . PAD (peripheral artery disease) ([place])   . Pruritus   . RA (rheumatoid arthritis) ([place])   . Radiculopathy   . Restless legs syndrome   . RLS (restless legs syndrome)   . SLE (systemic lupus erythematosus related syndrome) ([place])   . Stroke ([place])   . Toxic maculopathy of both eyes     Past Surgical History:  Procedure Laterality Date  . CARDIAC CATHETERIZATION    . CAROTID ENDARTERECTOMY    . CERVICAL LAMINECTOMY    . CORONARY ANGIOPLASTY    . FRACTURE SURGERY    . fusion C5-6-7    . HERNIA REPAIR    . KNEE  ARTHROSCOPY    . TONSILLECTOMY      There were no vitals filed for this visit.  Subjective Assessment - 10/17/18 1516    Subjective   Pt. was late for the session.    Patient is accompanied by:  Family member    Pertinent History  Pt. is a 73 y.o. 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 Pt. PMHx includes: Multiple Falls, Lupus, DJD, Renal Abscess, CVA. Pt. resides with his wife. Pt.'s wife and daughter assist with ADLs. Pt. has caregivers in  for 2 hours a day, 6 days a week. Pt. received Rehab services in acute care, at SNF for STR, and [place] services. Pt. is retired from [employer] for [employer] and [employer].    Currently in Pain?  No/denies     OT TREATMENT    Neuro muscular re-education:  Pt. worked on grasping 2" large pegs from a container placed at the left side, and placing them onto the [employer] placed at a tabletop surface following a jumbo [employer] pattern. Pt. required heavy verbal cues, and visual cues to follow the design pattern. Pt. required increased time,  rest breaks, verbal, and tactile cues to complete the task, and use his left hand.                         OT Education - 10/17/18 1517    Education Details  LUE functioning    Person(s) Educated  Patient    Methods  Explanation;Demonstration    Comprehension  Verbalized understanding;Returned demonstration;Verbal cues required          OT Long Term Goals - 09/25/18 1719      OT LONG TERM GOAL #1   Title  Pt. will increase left shoulder flexion AROM by 10 degrees to access cabinet/shelf.    Baseline  AROM left shoulder flexion: 108    Time  12    Period  Weeks    Status  On-going    Target Date  11/27/18      OT LONG TERM GOAL #2   Title  Pt. will donn a shirt with Supervision.    Baseline  Min-ModA    Time  12    Period  Weeks    Status  On-going    Target Date  11/27/18      OT LONG TERM GOAL #3   Title  Pt. will  require ModA to perform LE dressing    Baseline  MaxA left, ModA with the right    Time  12    Period  Weeks    Status  On-going    Target Date  11/27/18      OT LONG TERM GOAL #4   Title  Pt. will improve left grip strength by 10# to be able to open a jar/container.    Baseline  Left: 30, Right 40    Time  12    Period  Weeks    Status  On-going    Target Date  11/27/18      OT LONG TERM GOAL #5   Title  Pt. will improve left hand [facility] skills to be able to assit with buttoning/zipping.    Baseline  Left: 1 min & 50 sec. R: 56 sec.    Time  12    Period  Weeks    Status  New    Target Date  11/27/18      OT LONG TERM GOAL #6   Title  Pt. will demonstrate visual conmpensatory strategies during 100% of the time during ADLs    Baseline  Pt. requires min-mod for left side awareness for tabletop tasks in his near space, Max cues in the extrapersonal space    Time  12    Period  Weeks    Status  On-going    Target Date  11/27/18      OT LONG TERM GOAL #7   Title  Pt. will prepare a simple cold snack from w/c with supervision using cognitive compensatory strategies 100% of the time.    Baseline  Pt. is unable    Time  12    Period  Weeks    Status  On-going    Target Date  11/27/18      OT LONG TERM GOAL #8   Title  Pt. will accurately identify potential safety hazard using good safety awareness, and judgement 100% for ADLs, and IADLs.    Baseline  limited    Time  12    Period  Weeks    Status  On-going    Target Date  11/27/18  OT LONG TERM GOAL  #9   TITLE  Pt. will  navigate his w/c around obstacles with Supervision and 100% accuracy    Baseline  Pt. requires maxA, and heavy cuing for obstacle on the left    Time  12    Period  Weeks    Status  On-going    Target Date  11/27/18            Plan - 10/17/18 1518    Clinical Impression Statement  Pt. and daughter were provided with education about options for mens adaptive clothing line to assist with making  easier access for urinal use. Information was provided. Pt. reports they worked on UE dressing while sitting at the EOB today. Pt. edcuation was provided about one armed self-dressing techniques. Pt. continues to work on improving LUE functioning, left sided awareness, and attention during tasks in order to increase engagement during ADLs, and IADL tasks.    OT Occupational Profile and History  Problem Focused Assessment - Including review of records relating to presenting problem    Occupational performance deficits (Please refer to evaluation for details):  ADL's    Body Structure / Function / Physical Skills  UE functional use;Coordination;FMC;Dexterity;Strength;ROM    Cognitive Skills  Attention;Memory;Emotional;Problem Solve;Safety Awareness    Rehab Potential  Good    Clinical Decision Making  Several treatment options, min-mod task modification necessary    Comorbidities Affecting Occupational Performance:  Presence of comorbidities impacting occupational performance    Comorbidities impacting occupational performance description:  Phyical, cognitive, visual,  medical comorbidities    Modification or Assistance to Complete Evaluation   No modification of tasks or assist necessary to complete eval    OT Frequency  3x / week    OT Duration  12 weeks    OT Treatment/Interventions  Self-care/ADL training;DME and/or AE instruction;Therapeutic exercise;Therapeutic activities;Moist Heat;Cognitive remediation/compensation;Neuromuscular education;Visual/perceptual remediation/compensation;Coping strategies training;Patient/family education;Passive range of motion;Psychosocial skills training;Energy conservation;Functional Mobility Training    Consulted and Agree with Plan of Care  Patient    Family Member Consulted  Daughter [name]       Patient will benefit from skilled therapeutic intervention in order to improve the following deficits and impairments:   Body Structure / Function / Physical Skills:  UE functional use, Coordination, FMC, Dexterity, Strength, ROM Cognitive Skills: Attention, Memory, Emotional, Problem Solve, Safety Awareness     Visit Diagnosis: Muscle weakness (generalized)  Other lack of coordination  Vision impairment  Cognitive communication deficit    Problem List There are no active problems to display for this patient.   Harrel Carina, MS, OTR/L 10/17/2018, 3:29 PM  [place] MAIN [facility] SERVICES [address] [place], [place], [address] Phone: [phone]   Fax:  [phone]  Name: TAHAJ STAKER MRN: [id number] Date of Birth: [birth year]

## 2018-10-18 ENCOUNTER — Other Ambulatory Visit: Payer: Self-pay

## 2018-10-18 ENCOUNTER — Emergency Department: Payer: Medicare Other

## 2018-10-18 ENCOUNTER — Encounter: Payer: Self-pay | Admitting: Specialist

## 2018-10-18 ENCOUNTER — Inpatient Hospital Stay
Admission: EM | Admit: 2018-10-18 | Discharge: 2018-10-21 | DRG: 872 | Disposition: A | Discharge status: Home / Self Care | Payer: Medicare Other | Attending: Internal Medicine | Admitting: Internal Medicine

## 2018-10-18 DIAGNOSIS — Z7952 Long term (current) use of systemic steroids: Secondary | ICD-10-CM

## 2018-10-18 DIAGNOSIS — Z7982 Long term (current) use of aspirin: Secondary | ICD-10-CM

## 2018-10-18 DIAGNOSIS — Z79899 Other long term (current) drug therapy: Secondary | ICD-10-CM

## 2018-10-18 DIAGNOSIS — M069 Rheumatoid arthritis, unspecified: Secondary | ICD-10-CM | POA: Diagnosis present

## 2018-10-18 DIAGNOSIS — A419 Sepsis, unspecified organism: Principal | ICD-10-CM

## 2018-10-18 DIAGNOSIS — Z66 Do not resuscitate: Secondary | ICD-10-CM | POA: Diagnosis present

## 2018-10-18 DIAGNOSIS — I2510 Atherosclerotic heart disease of native coronary artery without angina pectoris: Secondary | ICD-10-CM | POA: Diagnosis present

## 2018-10-18 DIAGNOSIS — Z87891 Personal history of nicotine dependence: Secondary | ICD-10-CM

## 2018-10-18 DIAGNOSIS — L03115 Cellulitis of right lower limb: Secondary | ICD-10-CM | POA: Diagnosis present

## 2018-10-18 DIAGNOSIS — J069 Acute upper respiratory infection, unspecified: Secondary | ICD-10-CM

## 2018-10-18 DIAGNOSIS — Z7901 Long term (current) use of anticoagulants: Secondary | ICD-10-CM

## 2018-10-18 DIAGNOSIS — J209 Acute bronchitis, unspecified: Secondary | ICD-10-CM | POA: Diagnosis present

## 2018-10-18 DIAGNOSIS — G2581 Restless legs syndrome: Secondary | ICD-10-CM | POA: Diagnosis present

## 2018-10-18 DIAGNOSIS — R05 Cough: Secondary | ICD-10-CM

## 2018-10-18 DIAGNOSIS — K219 Gastro-esophageal reflux disease without esophagitis: Secondary | ICD-10-CM | POA: Diagnosis present

## 2018-10-18 DIAGNOSIS — Z7902 Long term (current) use of antithrombotics/antiplatelets: Secondary | ICD-10-CM

## 2018-10-18 DIAGNOSIS — Z20828 Contact with and (suspected) exposure to other viral communicable diseases: Secondary | ICD-10-CM | POA: Diagnosis present

## 2018-10-18 DIAGNOSIS — Z8673 Personal history of transient ischemic attack (TIA), and cerebral infarction without residual deficits: Secondary | ICD-10-CM

## 2018-10-18 DIAGNOSIS — R5381 Other malaise: Secondary | ICD-10-CM | POA: Diagnosis present

## 2018-10-18 DIAGNOSIS — N189 Chronic kidney disease, unspecified: Secondary | ICD-10-CM | POA: Diagnosis present

## 2018-10-18 DIAGNOSIS — I739 Peripheral vascular disease, unspecified: Secondary | ICD-10-CM | POA: Diagnosis present

## 2018-10-18 DIAGNOSIS — L97319 Non-pressure chronic ulcer of right ankle with unspecified severity: Secondary | ICD-10-CM | POA: Diagnosis present

## 2018-10-18 DIAGNOSIS — Z993 Dependence on wheelchair: Secondary | ICD-10-CM

## 2018-10-18 DIAGNOSIS — G629 Polyneuropathy, unspecified: Secondary | ICD-10-CM | POA: Diagnosis present

## 2018-10-18 DIAGNOSIS — I129 Hypertensive chronic kidney disease with stage 1 through stage 4 chronic kidney disease, or unspecified chronic kidney disease: Secondary | ICD-10-CM | POA: Diagnosis present

## 2018-10-18 DIAGNOSIS — F329 Major depressive disorder, single episode, unspecified: Secondary | ICD-10-CM | POA: Diagnosis present

## 2018-10-18 DIAGNOSIS — Z7401 Bed confinement status: Secondary | ICD-10-CM

## 2018-10-18 DIAGNOSIS — M329 Systemic lupus erythematosus, unspecified: Secondary | ICD-10-CM | POA: Diagnosis present

## 2018-10-18 DIAGNOSIS — N400 Enlarged prostate without lower urinary tract symptoms: Secondary | ICD-10-CM | POA: Diagnosis present

## 2018-10-18 DIAGNOSIS — R509 Fever, unspecified: Secondary | ICD-10-CM

## 2018-10-18 DIAGNOSIS — R059 Cough, unspecified: Secondary | ICD-10-CM

## 2018-10-18 DIAGNOSIS — Z9861 Coronary angioplasty status: Secondary | ICD-10-CM

## 2018-10-18 DIAGNOSIS — Z23 Encounter for immunization: Secondary | ICD-10-CM

## 2018-10-18 DIAGNOSIS — E785 Hyperlipidemia, unspecified: Secondary | ICD-10-CM | POA: Diagnosis present

## 2018-10-18 LAB — PROTIME-INR
INR: 2.5 — ABNORMAL HIGH (ref 0.8–1.2)
Prothrombin Time: 26.9 seconds — ABNORMAL HIGH (ref 11.4–15.2)

## 2018-10-18 LAB — CBC WITH DIFFERENTIAL/PLATELET
Abs Immature Granulocytes: 0.1 10*3/uL — ABNORMAL HIGH (ref 0.00–0.07)
Basophils Absolute: 0.1 10*3/uL (ref 0.0–0.1)
Basophils Relative: 0 %
Eosinophils Absolute: 0.1 10*3/uL (ref 0.0–0.5)
Eosinophils Relative: 1 %
HCT: 39.1 % (ref 39.0–52.0)
Hemoglobin: 12.9 g/dL — ABNORMAL LOW (ref 13.0–17.0)
Immature Granulocytes: 1 %
Lymphocytes Relative: 11 %
Lymphs Abs: 1.8 10*3/uL (ref 0.7–4.0)
MCH: 29.8 pg (ref 26.0–34.0)
MCHC: 33 g/dL (ref 30.0–36.0)
MCV: 90.3 fL (ref 80.0–100.0)
Monocytes Absolute: 1.2 10*3/uL — ABNORMAL HIGH (ref 0.1–1.0)
Monocytes Relative: 7 %
Neutro Abs: 13.1 10*3/uL — ABNORMAL HIGH (ref 1.7–7.7)
Neutrophils Relative %: 80 %
Platelets: 212 10*3/uL (ref 150–400)
RBC: 4.33 MIL/uL (ref 4.22–5.81)
RDW: 13.2 % (ref 11.5–15.5)
WBC: 16.3 10*3/uL — ABNORMAL HIGH (ref 4.0–10.5)
nRBC: 0 % (ref 0.0–0.2)

## 2018-10-18 LAB — URINALYSIS, ROUTINE W REFLEX MICROSCOPIC
Bacteria, UA: NONE SEEN
Bilirubin Urine: NEGATIVE
Glucose, UA: NEGATIVE mg/dL
Ketones, ur: NEGATIVE mg/dL
Leukocytes,Ua: NEGATIVE
Nitrite: NEGATIVE
Protein, ur: NEGATIVE mg/dL
Specific Gravity, Urine: 1.012 (ref 1.005–1.030)
Squamous Epithelial / HPF: NONE SEEN (ref 0–5)
WBC, UA: NONE SEEN WBC/hpf (ref 0–5)
pH: 5 (ref 5.0–8.0)

## 2018-10-18 LAB — COMPREHENSIVE METABOLIC PANEL
ALT: 21 U/L (ref 0–44)
AST: 23 U/L (ref 15–41)
Albumin: 3.4 g/dL — ABNORMAL LOW (ref 3.5–5.0)
Alkaline Phosphatase: 99 U/L (ref 38–126)
Anion gap: 9 (ref 5–15)
BUN: 26 mg/dL — ABNORMAL HIGH (ref 8–23)
CO2: 24 mmol/L (ref 22–32)
Calcium: 8.6 mg/dL — ABNORMAL LOW (ref 8.9–10.3)
Chloride: 103 mmol/L (ref 98–111)
Creatinine, Ser: 1.14 mg/dL (ref 0.61–1.24)
GFR calc Af Amer: >60 mL/min (ref >60)
GFR calc non Af Amer: >60 mL/min (ref >60)
Glucose, Bld: 126 mg/dL — ABNORMAL HIGH (ref 70–99)
Potassium: 4.3 mmol/L (ref 3.5–5.1)
Sodium: 136 mmol/L (ref 135–145)
Total Bilirubin: 0.9 mg/dL (ref 0.3–1.2)
Total Protein: 7.1 g/dL (ref 6.5–8.1)

## 2018-10-18 LAB — RESPIRATORY PANEL BY PCR

## 2018-10-18 LAB — APTT: aPTT: 61 seconds — ABNORMAL HIGH (ref 24–36)

## 2018-10-18 LAB — MRSA PCR SCREENING: MRSA by PCR: NEGATIVE

## 2018-10-18 LAB — SARS CORONAVIRUS 2 BY RT PCR (HOSPITAL ORDER, PERFORMED IN ~~LOC~~ HOSPITAL LAB): SARS Coronavirus 2: NEGATIVE

## 2018-10-18 LAB — LACTIC ACID, PLASMA: Lactic Acid, Venous: 1.8 mmol/L (ref 0.5–1.9)

## 2018-10-18 MED ORDER — WARFARIN - PHYSICIAN DOSING INPATIENT: Freq: Every day | Status: DC

## 2018-10-18 MED ORDER — LOSARTAN POTASSIUM 50 MG PO TABS
25.0000 mg | ORAL_TABLET | Freq: Every day | ORAL | Status: DC
  Administered 2018-10-19 – 2018-10-21 (×3): 25 mg via ORAL
  Filled 2018-10-18 (×3): qty 1

## 2018-10-18 MED ORDER — WARFARIN SODIUM 2.5 MG PO TABS
2.5000 mg | ORAL_TABLET | ORAL | Status: DC
  Administered 2018-10-18: 2.5 mg via ORAL
  Filled 2018-10-18 (×2): qty 1

## 2018-10-18 MED ORDER — ASPIRIN EC 81 MG PO TBEC
81.0000 mg | DELAYED_RELEASE_TABLET | Freq: Every day | ORAL | Status: DC
  Administered 2018-10-19 – 2018-10-21 (×3): 81 mg via ORAL
  Filled 2018-10-18 (×3): qty 1

## 2018-10-18 MED ORDER — ATORVASTATIN CALCIUM 20 MG PO TABS
80.0000 mg | ORAL_TABLET | Freq: Every day | ORAL | Status: DC
  Administered 2018-10-18 – 2018-10-20 (×3): 80 mg via ORAL
  Filled 2018-10-18 (×3): qty 4

## 2018-10-18 MED ORDER — VANCOMYCIN HCL 1.5 G IV SOLR
1500.0000 mg | Freq: Once | INTRAVENOUS | Status: AC
  Administered 2018-10-18: 1500 mg via INTRAVENOUS
  Filled 2018-10-18: qty 1500

## 2018-10-18 MED ORDER — INFLUENZA VAC A&B SA ADJ QUAD 0.5 ML IM PRSY
0.5000 mL | PREFILLED_SYRINGE | INTRAMUSCULAR | Status: AC
  Administered 2018-10-21: 0.5 mL via INTRAMUSCULAR
  Filled 2018-10-18 (×2): qty 0.5

## 2018-10-18 MED ORDER — FLUOXETINE HCL 20 MG PO CAPS
40.0000 mg | ORAL_CAPSULE | Freq: Every day | ORAL | Status: DC
  Administered 2018-10-19 – 2018-10-21 (×3): 40 mg via ORAL
  Filled 2018-10-18 (×3): qty 2

## 2018-10-18 MED ORDER — SODIUM CHLORIDE 0.9 % IV BOLUS
1000.0000 mL | Freq: Once | INTRAVENOUS | Status: AC
  Administered 2018-10-18: 1000 mL via INTRAVENOUS

## 2018-10-18 MED ORDER — WARFARIN SODIUM 5 MG PO TABS
5.0000 mg | ORAL_TABLET | ORAL | Status: DC
  Filled 2018-10-18: qty 1

## 2018-10-18 MED ORDER — ACETAMINOPHEN 325 MG PO TABS
650.0000 mg | ORAL_TABLET | Freq: Four times a day (QID) | ORAL | Status: DC | PRN
  Administered 2018-10-19 – 2018-10-20 (×2): 650 mg via ORAL
  Filled 2018-10-18 (×2): qty 2

## 2018-10-18 MED ORDER — VANCOMYCIN HCL IN DEXTROSE 1-5 GM/200ML-% IV SOLN
1000.0000 mg | Freq: Once | INTRAVENOUS | Status: AC
  Administered 2018-10-18: 1000 mg via INTRAVENOUS
  Filled 2018-10-18: qty 200

## 2018-10-18 MED ORDER — SODIUM CHLORIDE 0.9 % IV SOLN
INTRAVENOUS | Status: DC
  Administered 2018-10-18 – 2018-10-21 (×5): via INTRAVENOUS

## 2018-10-18 MED ORDER — PREDNISONE 10 MG PO TABS
5.0000 mg | ORAL_TABLET | Freq: Every day | ORAL | Status: DC
  Administered 2018-10-19 – 2018-10-21 (×3): 5 mg via ORAL
  Filled 2018-10-18 (×3): qty 1

## 2018-10-18 MED ORDER — SODIUM CHLORIDE 0.9 % IV SOLN
2.0000 g | Freq: Once | INTRAVENOUS | Status: AC
  Administered 2018-10-18: 2 g via INTRAVENOUS
  Filled 2018-10-18: qty 2

## 2018-10-18 MED ORDER — ACETAMINOPHEN 650 MG RE SUPP: 650.0000 mg | Freq: Four times a day (QID) | RECTAL | Status: DC | PRN

## 2018-10-18 MED ORDER — ACETAMINOPHEN 500 MG PO TABS
1000.0000 mg | ORAL_TABLET | Freq: Once | ORAL | Status: AC
  Administered 2018-10-18: 1000 mg via ORAL
  Filled 2018-10-18: qty 2

## 2018-10-18 MED ORDER — METRONIDAZOLE IN NACL 5-0.79 MG/ML-% IV SOLN
500.0000 mg | Freq: Once | INTRAVENOUS | Status: AC
  Administered 2018-10-18: 500 mg via INTRAVENOUS
  Filled 2018-10-18: qty 100

## 2018-10-18 MED ORDER — ONDANSETRON HCL 4 MG PO TABS: 4.0000 mg | ORAL_TABLET | Freq: Four times a day (QID) | ORAL | Status: DC | PRN

## 2018-10-18 MED ORDER — ROPINIROLE HCL 1 MG PO TABS: 2.0000 mg | ORAL_TABLET | Freq: Every evening | ORAL | Status: DC | PRN

## 2018-10-18 MED ORDER — ONDANSETRON HCL 4 MG/2ML IJ SOLN: 4.0000 mg | Freq: Four times a day (QID) | INTRAMUSCULAR | Status: DC | PRN

## 2018-10-18 MED ORDER — TRAZODONE HCL 50 MG PO TABS: 25.0000 mg | ORAL_TABLET | Freq: Every day | ORAL | Status: DC

## 2018-10-18 MED ORDER — QUETIAPINE FUMARATE 25 MG PO TABS
25.0000 mg | ORAL_TABLET | Freq: Every day | ORAL | Status: DC
  Administered 2018-10-19 – 2018-10-20 (×2): 25 mg via ORAL
  Filled 2018-10-18 (×2): qty 1

## 2018-10-18 MED ORDER — GABAPENTIN 400 MG PO CAPS
400.0000 mg | ORAL_CAPSULE | Freq: Every day | ORAL | Status: DC
  Administered 2018-10-18 – 2018-10-20 (×3): 400 mg via ORAL
  Filled 2018-10-18 (×2): qty 4
  Filled 2018-10-18: qty 1
  Filled 2018-10-18: qty 4
  Filled 2018-10-18 (×3): qty 1

## 2018-10-18 MED ORDER — MELATONIN 5 MG PO TABS
5.0000 mg | ORAL_TABLET | Freq: Every day | ORAL | Status: DC
  Filled 2018-10-18 (×2): qty 1

## 2018-10-18 MED ORDER — MONTELUKAST SODIUM 10 MG PO TABS
10.0000 mg | ORAL_TABLET | Freq: Every day | ORAL | Status: DC
  Administered 2018-10-18 – 2018-10-21 (×4): 10 mg via ORAL
  Filled 2018-10-18 (×5): qty 1

## 2018-10-18 NOTE — H&P (Signed)
[name] NAME: Kenneth Summers    MR#:  [id number]  DATE OF BIRTH:  May 24, 1945  DATE OF ADMISSION:  10/18/2018  PRIMARY CARE PHYSICIAN: Kenneth Summers, [facility]   REQUESTING/REFERRING PHYSICIAN: Dr. Harvest Dark  CHIEF COMPLAINT:   Chief Complaint  Patient presents with  . Cough  . Fever    HISTORY OF PRESENT ILLNESS:  Kenneth Summers  is a 73 y.o. male with a known history of coronary artery disease, BPH, history of antiphospholipid syndrome, hypertension, hyperlipidemia, IBS, cervical spinal stenosis, peripheral artery disease, rheumatoid arthritis, restless leg syndrome who presented to the hospital due to fever, cough.  As per the patient's wife patient woke up this morning around 530 having significant chills.  He has had a dry cough now for the past few months but denies any shortness of breath, nausea, vomiting diarrhea, sick contacts or loss of taste.  He has had no recent travel history.  He presented to the ER was noted to have a fever of 103 and leukocytosis but no source of the fever was identified.  [name] COVID-19 test is negative, his chest x-ray is negative for pneumonia, urinalysis is negative.  Patient denies any abdominal pain diarrhea or any unusual rashes on his body.  Hospitalist services were contacted for admission for suspected sepsis.  PAST MEDICAL HISTORY:   Past Medical History:  Diagnosis Date  . Alcohol abuse   . APS (antiphospholipid syndrome) ([place])   . BPH (benign prostatic hyperplasia)   . CAD (coronary artery disease)   . Cellulitis   . Cervical spinal stenosis   . Chronic osteomyelitis ([place])   . CKD (chronic kidney disease)   . CKD (chronic kidney disease)   . Clostridium difficile diarrhea   . Depression   . Diplopia   . Fatigue   . GERD (gastroesophageal reflux disease)   . Hyperlipemia   . Hypertension   . Hypovitaminosis D   . IBS (irritable bowel syndrome)   . 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 Insomnia   . Left lumbosacral radiculopathy   . Moderate obstructive sleep apnea   . Osteomyelitis of foot ([place])   . PAD (peripheral artery disease) ([place])   . Pruritus   . RA (rheumatoid arthritis) ([place])   . Radiculopathy   . Restless legs syndrome   . RLS (restless legs syndrome)   . SLE (systemic lupus erythematosus related syndrome) ([place])   . Stroke ([place])   . Toxic maculopathy of both eyes     PAST SURGICAL HISTORY:   Past Surgical History:  Procedure Laterality Date  . CARDIAC CATHETERIZATION    . CAROTID ENDARTERECTOMY    . CERVICAL LAMINECTOMY    . CORONARY ANGIOPLASTY    . FRACTURE SURGERY    . fusion C5-6-7    . HERNIA REPAIR    . KNEE ARTHROSCOPY    . TONSILLECTOMY      SOCIAL HISTORY:   Social History   Tobacco Use  . Smoking status: Former Smoker    Packs/day: 1.00    Years: 20.00    Pack years: 20.00    Types: Cigarettes  . Smokeless tobacco: Never Used  Substance Use Topics  . Alcohol use: Yes    Comment: Socially    FAMILY HISTORY:   Family History  Problem Relation Age of Onset  . COPD Mother   . Stroke Father     DRUG ALLERGIES:   Allergies  Allergen Reactions  .  Hydroxychloroquine Other (See Comments)    Other reaction(s): Other (See Comments)  Causing blindness Visual problems     REVIEW OF SYSTEMS:   Review of Systems  Constitutional: Positive for chills and fever. Negative for weight loss.  HENT: Negative for congestion, nosebleeds and tinnitus.   Eyes: Negative for blurred vision, double vision and redness.  Respiratory: Positive for cough. Negative for hemoptysis and shortness of breath.   Cardiovascular: Negative for chest pain, orthopnea, leg swelling and PND.  Gastrointestinal: Negative for abdominal pain, diarrhea, melena, nausea and vomiting.  Genitourinary: Negative for dysuria, hematuria and urgency.  Musculoskeletal: Negative for falls and joint pain.  Neurological: Negative for dizziness,  tingling, sensory change, focal weakness, seizures, weakness and headaches.  Endo/Heme/Allergies: Negative for polydipsia. Does not bruise/bleed easily.  Psychiatric/Behavioral: Negative for depression and memory loss. The patient is not nervous/anxious.     MEDICATIONS AT HOME:   Prior to Admission medications   Medication Sig Start Date End Date Taking? Authorizing Provider  aspirin EC 81 MG tablet Take by mouth daily.   Yes [provider]  atorvastatin (LIPITOR) 80 MG tablet Take 80 mg by mouth Nightly.    Yes [provider]  FLUoxetine (PROZAC) 40 MG capsule Take 40 mg by mouth daily.   Yes [provider]  gabapentin (NEURONTIN) 400 MG capsule Take 400 mg by mouth at bedtime.    Yes [provider]  losartan (COZAAR) 25 MG tablet Take 25 mg by mouth daily.   Yes [provider]  Melatonin 3 MG TABS Take 6 mg by mouth at bedtime.    Yes [provider]  montelukast (SINGULAIR) 10 MG tablet Take 10 mg by mouth daily.   Yes [provider]  predniSONE (DELTASONE) 5 MG tablet Take 5 mg by mouth daily with breakfast.   Yes [provider]  QUEtiapine (SEROQUEL) 25 MG tablet Take 25-50 mg at night 10/15/18  Yes [provider]  rOPINIRole (REQUIP) 2 MG tablet Take 2 mg by mouth at bedtime as needed.    Yes [provider]  traZODone (DESYREL) 50 MG tablet Take 25 mg by mouth at bedtime.   Yes [provider]  warfarin (COUMADIN) 2.5 MG tablet Take 2.5 mg by mouth 3 (three) times a week. [place], wed, fri 08/29/18  Yes [provider]  warfarin (COUMADIN) 5 MG tablet Take 5 mg by mouth 4 (four) times a week. Tues, thurs,sat sun   Yes [provider]      VITAL SIGNS:  Blood pressure 107/62, pulse 79, temperature 99.6 F (37.6 C), temperature source Axillary, resp. rate (!) 22, height 6\' 5"  (1.956 m), weight 108 kg, SpO2 98 %.  PHYSICAL EXAMINATION:  Physical Exam  GENERAL:  73  y.o.-year-old patient lying in the bed in NAD.   EYES: Pupils equal, round, reactive to light and accommodation. No scleral icterus. Extraocular muscles intact.  HEENT: Head atraumatic, normocephalic. Oropharynx and nasopharynx clear. No oropharyngeal erythema, moist oral mucosa  NECK:  Supple, no jugular venous distention. No thyroid enlargement, no tenderness.  LUNGS: Poor Resp. effort, no wheezing, rales, rhonchi. No use of accessory muscles of respiration.  CARDIOVASCULAR: S1, S2 RRR. No murmurs, rubs, gallops, clicks.  ABDOMEN: Soft, nontender, nondistended. Bowel sounds present. No organomegaly or mass.  EXTREMITIES: +1-2 edema b/l, No cyanosis, or clubbing. + 2 pedal & radial pulses b/l.   NEUROLOGIC: Cranial nerves II through XII are intact. No focal Motor or sensory deficits appreciated b/l. Globally  weak with profound LLE weakness.  PSYCHIATRIC: The patient is alert and oriented x 3.  SKIN: No obvious rash, lesion, or ulcer.   LABORATORY PANEL:   CBC Recent Labs  Lab 10/18/18 1222  WBC 16.3*  HGB 12.9*  HCT 39.1  PLT 212   ------------------------------------------------------------------------------------------------------------------  Chemistries  Recent Labs  Lab 10/18/18 1222  NA 136  K 4.3  CL 103  CO2 24  GLUCOSE 126*  BUN 26*  CREATININE 1.14  CALCIUM 8.6*  AST 23  ALT 21  ALKPHOS 99  BILITOT 0.9   ------------------------------------------------------------------------------------------------------------------  Cardiac Enzymes No results for input(s): TROPONINI in the last 168 hours. ------------------------------------------------------------------------------------------------------------------  RADIOLOGY:  Dg Chest Port 1 View  Result Date: 10/18/2018 CLINICAL DATA:  Cough and fever. Sepsis. EXAM: PORTABLE CHEST 1 VIEW COMPARISON:  04/18/2013 FINDINGS: The heart size and pulmonary vascularity are normal. There is slight new fullness in the  region of main pulmonary artery just below the aortic arch as compared to the prior exam. Lungs are clear. No acute bone abnormality. Aortic atherosclerosis. IMPRESSION: Fullness in the region of the aortopulmonary window and main pulmonary artery could represent new adenopathy. Aortic Atherosclerosis (ICD10-I70.0). Electronically Signed   By: Lorriane Shire M.D.   On: 10/18/2018 13:02     IMPRESSION AND PLAN:   73 y.o. male with a known history of coronary artery disease, BPH, history of antiphospholipid syndrome, hypertension, hyperlipidemia, IBS, cervical spinal stenosis, peripheral artery disease, rheumatoid arthritis, restless leg syndrome who presented to the hospital due to fever, cough.  1.  Sepsis-patient meets criteria given fever, leukocytosis on admission.  Source of sepsis remains unclear.  [name] chest x-ray is negative, urinalysis is negative and COVID-19 test is also negative. - Patient received 1 dose of broad-spectrum antibiotics including IV vancomycin, cefepime and Flagyl.  I will hold off on further antibiotics at this time. -Follow fever curve, cultures for now.  I will check a respiratory panel given the patient's ongoing cough.  2.  Leukocytosis-suspected to be secondary to sepsis. -Follow with IV antibiotic therapy.  Patient is also on chronic prednisone which may be contributing to this.  3.  History of restless leg syndrome-continue patient's Requip.  4.  Depression-continue Prozac.    5.  History of antiphospholipid syndrome-continue patient's Coumadin.  INR is therapeutic.  6.  Essential hypertension-continue losartan.  7.  Neuropathy-continue gabapentin.  8.  Hyperlipidemia-continue atorvastatin.    All the records are reviewed and case discussed with ED provider. Management plans discussed with the patient, family and they are in agreement.  CODE STATUS: DNR  TOTAL TIME TAKING CARE OF THIS PATIENT: 40 minutes.    Henreitta Leber M.D on 10/18/2018  at 3:36 PM  Between 7am to 6pm - Pager - [phone]  After 6pm go to [email or web address] - password [email or web address] [facility]  [facility]  Office  [phone]  CC: Primary care physician; [facility]

## 2018-10-18 NOTE — Progress Notes (Signed)
PHARMACY -  BRIEF ANTIBIOTIC NOTE   Pharmacy has received consult(s) for vancomycin and cefepime from an ED provider.  The patient's profile has been reviewed for ht/wt/allergies/indication/available labs.    One time order(s) placed for vanc (1000 mg + 1500 mg) + cefepime 2 g  Further antibiotics/pharmacy consults should be ordered by admitting physician if indicated.                       Thank you,  Tawnya Crook, PharmD 10/18/2018  2:34 PM
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
## 2018-10-18 NOTE — ED Provider Notes (Signed)
[facility] Emergency Department Provider Note  Time seen: 12:22 PM  I have reviewed the triage vital signs and the nursing notes.   HISTORY  Chief Complaint Cough and Fever   HPI Kenneth Summers is a 73 y.o. male with a past medical history of alcohol use, CKD, hypertension, hyperlipidemia, CVA, presents to the emergency department for weakness fever cough and shortness of breath.  According to report patient's home health aide who has been caring for the patient has been exposed to coronavirus.  Yesterday patient awoke with shortness of breath and cough.  Continued to worsen today along with some mild confusion weakness and found to be febrile.  The patient is awake alert he is oriented to person and place only but not time.  Patient denies any nausea vomiting diarrhea dysuria chest pain or abdominal pain.  Does admit to cough and shortness of breath as well as subjective fever.   Past Medical History:  Diagnosis Date  . Alcohol abuse   . APS (antiphospholipid syndrome) ([place])   . BPH (benign prostatic hyperplasia)   . CAD (coronary artery disease)   . Cellulitis   . Cervical spinal stenosis   . Chronic osteomyelitis ([place])   . CKD (chronic kidney disease)   . CKD (chronic kidney disease)   . Clostridium difficile diarrhea   . Depression   . Diplopia   . Fatigue   . GERD (gastroesophageal reflux disease)   . Hyperlipemia   . Hypertension   . Hypovitaminosis D   . IBS (irritable bowel syndrome)   . 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 Insomnia   . Left lumbosacral radiculopathy   . Moderate obstructive sleep apnea   . Osteomyelitis of foot ([place])   . PAD (peripheral artery disease) ([place])   . Pruritus   . RA (rheumatoid arthritis) ([place])   . Radiculopathy   . Restless legs syndrome   . RLS (restless legs syndrome)   . SLE (systemic lupus erythematosus related syndrome) ([place])   . Stroke ([place])   . Toxic maculopathy of both eyes     There are no active problems  to display for this patient.   Past Surgical History:  Procedure Laterality Date  . CARDIAC CATHETERIZATION    . CAROTID ENDARTERECTOMY    . CERVICAL LAMINECTOMY    . CORONARY ANGIOPLASTY    . FRACTURE SURGERY    . fusion C5-6-7    . HERNIA REPAIR    . KNEE ARTHROSCOPY    . TONSILLECTOMY      Prior to Admission medications   Medication Sig Start Date End Date Taking? Authorizing Provider  acetaminophen (TYLENOL) 500 MG tablet Take 500 mg by mouth every 6 (six) hours as needed.    [provider]  aspirin EC 81 MG tablet Take by mouth daily.    [provider]  atenolol (TENORMIN) 25 MG tablet Take by mouth daily.    [provider]  atorvastatin (LIPITOR) 80 MG tablet Take 80 mg by mouth daily.    [provider]  chlorpheniramine-HYDROcodone (TUSSIONEX PENNKINETIC ER) 10-8 MG/5ML SUER Take 5 mLs by mouth at bedtime as needed for cough. 03/04/16   Norval Gable, MD  clopidogrel (PLAVIX) 75 MG tablet Take 75 mg by mouth daily.    [provider]  FLUoxetine (PROZAC) 40 MG capsule Take 40 mg by mouth daily.    [provider]  gabapentin (NEURONTIN) 100 MG capsule Take 100 mg by mouth 3 (three) times  daily.    [provider]  gabapentin (NEURONTIN) 300 MG capsule Take 300 mg by mouth 3 (three) times daily.    [provider]  hydroxychloroquine (PLAQUENIL) 200 MG tablet Take by mouth daily.    [provider]  losartan (COZAAR) 25 MG tablet Take 25 mg by mouth daily.    [provider]  Melatonin 10 MG TABS Take by mouth.    [provider]  mupirocin ointment (BACTROBAN) 2 % Apply 1 application topically 3 (three) times daily. 05/08/16   Lorin Picket, PA-C  oseltamivir (TAMIFLU) 75 MG capsule Take 1 capsule (75 mg total) by mouth 2 (two) times daily. 03/04/16   Norval Gable, MD  predniSONE (DELTASONE) 5 MG tablet Take 5 mg by mouth daily with breakfast.    [provider]   rOPINIRole (REQUIP) 0.5 MG tablet Take 0.5 mg by mouth 3 (three) times daily.    [provider]  sulfamethoxazole-trimethoprim (BACTRIM DS,SEPTRA DS) 800-160 MG tablet Take 1 tablet by mouth 2 (two) times daily. 05/08/16   Lorin Picket, PA-C    No Known Allergies  No family history on file.  Social History Social History   Tobacco Use  . Smoking status: Former Research scientist (life sciences)  . Smokeless tobacco: Never Used  Substance Use Topics  . Alcohol use: No  . Drug use: No    Review of Systems Constitutional: Positive for fever ENT: Negative for recent illness/congestion Cardiovascular: Negative for chest pain. Respiratory: Positive for cough and shortness of breath Gastrointestinal: Negative for abdominal pain, vomiting and diarrhea. Genitourinary: Negative for urinary compaints Musculoskeletal: Negative for musculoskeletal complaints Skin: Negative for skin complaints  Neurological: Negative for headache All other ROS negative, although possibly limited due to altered mental status/confusion.  ____________________________________________   PHYSICAL EXAM:  VITAL SIGNS: ED Triage Vitals  Enc Vitals Group     BP 10/18/18 1216 96/62     Pulse Rate 10/18/18 1216 (!) 110     Resp 10/18/18 1216 (!) 26     Temp 10/18/18 1216 (!) 102.3 F (39.1 C)     Temp Source 10/18/18 1216 Rectal     SpO2 10/18/18 1214 90 %     Weight 10/18/18 1216 238 lb (108 kg)     Height 10/18/18 1216 6\' 5"  (1.956 m)     Head Circumference --      Peak Flow --      Pain Score 10/18/18 1216 0     Pain Loc --      Pain Edu? --      Excl. in [place]? --    Constitutional: Alert and oriented to person and place. Well appearing and in no distress. Eyes: Normal exam ENT      Head: Normocephalic and atraumatic.      Mouth/Throat: Mucous membranes are moist. Cardiovascular: Normal rate, regular rhythm.  Respiratory: Normal respiratory effort without tachypnea nor retractions. Breath sounds are clear   Gastrointestinal: Soft and nontender. No distention. Musculoskeletal: Nontender with normal range of motion in all extremities.  Neurologic:  Normal speech and language. No gross focal neurologic deficits  Skin:  Skin is warm, dry and intact.  Psychiatric: Mood and affect are normal.   ____________________________________________    EKG  EKG viewed and interpreted by myself shows sinus tachycardia 104 bpm with a narrow QRS, left axis deviation, largely normal intervals with nonspecific ST changes  ____________________________________________    RADIOLOGY  No acute abnormality  ____________________________________________   INITIAL IMPRESSION /  ASSESSMENT AND PLAN / ED COURSE  Pertinent labs & imaging results that were available during my care of the patient were reviewed by me and considered in my medical decision making (see chart for details).   Patient presents to the emergency department for shortness of breath fever and cough as well as mild confusion since yesterday.  Upon arrival patient satting 90% on room air no home O2 baseline requirement we will placed on 2 L.  Patient oriented to person and place only but not time.  Patient meets sepsis criteria however per report patient's brother recently had coronavirus and the home health aide recently was exposed to coronavirus.  We will check labs, cultures, chest x-ray, start IV fluids, treat the patient's fever and obtain a coronavirus test.  If coronavirus test is negative patient will be covered with broad-spectrum antibiotics.  [name] cover test is negative.  Work-up is largely nonrevealing.  Urinalysis is normal.  Chest x-ray is clear.  Patient does have a cough and fever is not entirely sure the patient's source of sepsis however strongly suspect upper respiratory infection.  We will start broad-spectrum antibiotics and the patient will be admitted to the hospital service for further treatment.   Kenneth Summers was  evaluated in Emergency Department on 10/18/2018 for the symptoms described in the history of present illness. He was evaluated in the context of the global COVID-19 pandemic, which necessitated consideration that the patient might be at risk for infection with the SARS-CoV-2 virus that causes COVID-19. Institutional protocols and algorithms that pertain to the evaluation of patients at risk for COVID-19 are in a state of rapid change based on information released by regulatory bodies including the CDC and federal and state organizations. These policies and algorithms were followed during the patient's care in the ED.  CRITICAL CARE Performed by: Harvest Dark   Total critical care time: 30 minutes  Critical care time was exclusive of separately billable procedures and treating other patients.  Critical care was necessary to treat or prevent imminent or life-threatening deterioration.  Critical care was time spent personally by me on the following activities: development of treatment plan with patient and/or surrogate as well as nursing, discussions with consultants, evaluation of patient's response to treatment, examination of patient, obtaining history from patient or surrogate, ordering and performing treatments and interventions, ordering and review of laboratory studies, ordering and review of radiographic studies, pulse oximetry and re-evaluation of patient's condition.   ____________________________________________   FINAL CLINICAL IMPRESSION(S) / ED DIAGNOSES  Sepsis Dyspnea Fever Upper respiratory infection   Harvest Dark, MD 10/18/18 1455

## 2018-10-18 NOTE — ED Notes (Signed)
Pt daughter reported pt has wound on right inner ankle, and left elbow. ED MD made aware. Both sites inspected by RN. Area on left ankle around 1" in diameter, with drainage noted. Elbow wound possibly 1/2 inch in diameter, no drainage noted at this site. Both wounds had padded bandages covering the sites.

## 2018-10-18 NOTE — Care Management Obs Status (Signed)
MEDICARE OBSERVATION STATUS NOTIFICATION   Patient Details  Name: Kenneth Summers MRN: [id number] Date of Birth: 11/04/1945   Medicare Observation Status Notification Given:  Yes    Katrina Stack, RN 10/18/2018, 4:50 PM

## 2018-10-18 NOTE — Progress Notes (Signed)
   [place] at [place] [facility] Day: 0 days Kenneth Summers is a 73 y.o. male known history of coronary artery disease, BPH, history of antiphospholipid syndrome, hypertension, hyperlipidemia, IBS, cervical spinal stenosis, peripheral artery disease, rheumatoid arthritis, restless leg syndrome who presented to the hospital due to  Cough and Fever .   Given patient's advanced age and multiple comorbidities discussed CODE STATUS with patient and patient's wife at bedside.  Explained to them the difference between DNR and a full code.  Patient does not want any heroic measures to save his life and would prefer to be a DNR.  Advance care planning discussed with patient  with additional Family at bedside. All questions in regards to overall condition and expected prognosis answered. The decision was made to continue current code status  CODE STATUS: dnr Time spent: 16 minutes
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
## 2018-10-19 LAB — CBC
HCT: 40 % (ref 39.0–52.0)
Hemoglobin: 12.7 g/dL — ABNORMAL LOW (ref 13.0–17.0)
MCH: 29.5 pg (ref 26.0–34.0)
MCHC: 31.8 g/dL (ref 30.0–36.0)
MCV: 92.8 fL (ref 80.0–100.0)
Platelets: 178 10*3/uL (ref 150–400)
RBC: 4.31 MIL/uL (ref 4.22–5.81)
RDW: 13.2 % (ref 11.5–15.5)
WBC: 11.1 10*3/uL — ABNORMAL HIGH (ref 4.0–10.5)
nRBC: 0 % (ref 0.0–0.2)

## 2018-10-19 LAB — BASIC METABOLIC PANEL
Anion gap: 9 (ref 5–15)
BUN: 28 mg/dL — ABNORMAL HIGH (ref 8–23)
CO2: 24 mmol/L (ref 22–32)
Calcium: 8.4 mg/dL — ABNORMAL LOW (ref 8.9–10.3)
Chloride: 108 mmol/L (ref 98–111)
Creatinine, Ser: 1.02 mg/dL (ref 0.61–1.24)
GFR calc Af Amer: >60 mL/min (ref >60)
GFR calc non Af Amer: >60 mL/min (ref >60)
Glucose, Bld: 92 mg/dL (ref 70–99)
Potassium: 4.5 mmol/L (ref 3.5–5.1)
Sodium: 141 mmol/L (ref 135–145)

## 2018-10-19 LAB — PROTIME-INR
INR: 2.8 — ABNORMAL HIGH (ref 0.8–1.2)
Prothrombin Time: 29.3 seconds — ABNORMAL HIGH (ref 11.4–15.2)

## 2018-10-19 MED ORDER — GUAIFENESIN 100 MG/5ML PO SOLN
5.0000 mL | ORAL | Status: DC | PRN
  Administered 2018-10-19 – 2018-10-21 (×6): 100 mg via ORAL
  Filled 2018-10-19 (×6): qty 10

## 2018-10-19 MED ORDER — COLLAGENASE 250 UNIT/GM EX OINT
TOPICAL_OINTMENT | Freq: Every day | CUTANEOUS | Status: DC
  Administered 2018-10-19: 14:00:00 via TOPICAL
  Filled 2018-10-19: qty 30

## 2018-10-19 MED ORDER — WARFARIN - PHARMACIST DOSING INPATIENT
Freq: Every day | Status: DC
  Administered 2018-10-19 – 2018-10-20 (×2)

## 2018-10-19 MED ORDER — WARFARIN SODIUM 2.5 MG PO TABS
2.5000 mg | ORAL_TABLET | Freq: Once | ORAL | Status: AC
  Administered 2018-10-19: 18:00:00 2.5 mg via ORAL
  Filled 2018-10-19: qty 1

## 2018-10-19 NOTE — Plan of Care (Signed)
  Problem: Nutrition: Goal: Adequate nutrition will be maintained Outcome: Completed/Met

## 2018-10-19 NOTE — Progress Notes (Signed)
[place] at [place] NAME: Kenneth Summers    MR#:  [id number]  DATE OF BIRTH:  [birth year]  SUBJECTIVE:  CHIEF COMPLAINT:   Chief Complaint  Patient presents with  . Cough  . Fever   Patient noted to be very sleepy this morning work arousable.  Reviewed meds and holding off on melatonin and trazodone which patient takes at night to decrease excessive sedation.  No fevers overnight.  Patient however denies any complaints.  REVIEW OF SYSTEMS:  Review of Systems  Constitutional: Negative for chills.       No fevers overnight  HENT: Negative for hearing loss and tinnitus.   Eyes: Negative for blurred vision and double vision.  Respiratory: Negative for cough and hemoptysis.   Cardiovascular: Negative for chest pain and palpitations.  Gastrointestinal: Negative for abdominal pain, heartburn, nausea and vomiting.  Genitourinary: Negative for dysuria and urgency.  Musculoskeletal: Negative for myalgias and neck pain.  Skin: Negative for itching and rash.  Neurological: Negative for dizziness and headaches.  Psychiatric/Behavioral: Negative for depression and substance abuse.    DRUG ALLERGIES:   Allergies  Allergen Reactions  . Hydroxychloroquine Other (See Comments)    Other reaction(s): Other (See Comments)  Causing blindness Visual problems    VITALS:  Blood pressure (!) 158/80, pulse 71, temperature 97.9 F (36.6 C), temperature source Oral, resp. rate 18, height 6\' 5"  (1.956 m), weight 108 kg, SpO2 96 %. PHYSICAL EXAMINATION:   Physical Exam  Constitutional: He is oriented to person, place, and time and well-developed, well-nourished, and in no distress.  HENT:  Head: Normocephalic and atraumatic.  Right Ear: External ear normal.  Eyes: Pupils are equal, round, and reactive to light. Conjunctivae are normal.  Neck: Normal range of motion. Neck supple. No tracheal deviation present.  Cardiovascular: Normal rate, regular  rhythm and normal heart sounds.  Pulmonary/Chest: Effort normal and breath sounds normal. No respiratory distress.  Abdominal: Soft. Bowel sounds are normal. He exhibits no distension.  Musculoskeletal: Normal range of motion.        General: No edema.     Comments: Also around the right ankle with pictures of the wound below.  Neurological: He is alert and oriented to person, place, and time.  Sleepy but easily arousable.  Skin: Skin is warm and dry. He is not diaphoretic.  Psychiatric: Affect and judgment normal.      LABORATORY PANEL:  Male CBC Recent Labs  Lab 10/19/18 0550  WBC 11.1*  HGB 12.7*  HCT 40.0  PLT 178   ------------------------------------------------------------------------------------------------------------------ Chemistries  Recent Labs  Lab 10/18/18 1222 10/19/18 0550  NA 136 141  K 4.3 4.5  CL 103 108  CO2 24 24  GLUCOSE 126* 92  BUN 26* 28*  CREATININE 1.14 1.02  CALCIUM 8.6* 8.4*  AST 23  --   ALT 21  --   ALKPHOS 99  --   BILITOT 0.9  --    RADIOLOGY:  Dg Chest Port 1 View  Result Date: 10/18/2018 CLINICAL DATA:  Cough and fever. Sepsis. EXAM: PORTABLE CHEST 1 VIEW COMPARISON:  04/18/2013 FINDINGS: The heart size and pulmonary vascularity are normal. There is slight new fullness in the region of main pulmonary artery just below the aortic arch as compared to the prior exam. Lungs are clear. No acute bone abnormality. Aortic atherosclerosis. IMPRESSION: Fullness in the region of the aortopulmonary window and main pulmonary artery could represent new adenopathy. Aortic Atherosclerosis (ICD10-I70.0). Electronically  Signed   By: Lorriane Shire M.D.   On: 10/18/2018 13:02   ASSESSMENT AND PLAN:   73 y.o. male with a known history of coronary artery disease, BPH, history of antiphospholipid syndrome, hypertension, hyperlipidemia, IBS, cervical spinal stenosis, peripheral artery disease, rheumatoid arthritis, restless leg syndrome who presented to  the hospital due to fever, cough.  1.  Sepsis-patient meets criteria given fever, leukocytosis on admission.  Source of sepsis remains unclear.  So far no source of sepsis. Follow-up on blood culture results [name] chest x-ray is negative, urinalysis is negative and COVID-19 test is also negative.  Respiratory panel negative. - Patient received 1 dose of broad-spectrum antibiotics including IV vancomycin, cefepime and Flagyl.  I will hold off on further antibiotics at this time. Patient has remained afebrile since yesterday.  2.  Leukocytosis-suspected to be secondary to sepsis. -Follow with IV antibiotic therapy.  Patient is also on chronic prednisone which may be contributing to this. WBC count down to 11,000.  3.  History of restless leg syndrome-continue patient's Requip.  4.  Depression-continue Prozac.    5.  History of antiphospholipid syndrome-continue patient's Coumadin.  INR is therapeutic at 2.8.  6.  Essential hypertension-continue losartan.  7.  Neuropathy-continue gabapentin.  8.  Hyperlipidemia-continue atorvastatin.  9.  Right ankle ulcer.;  Unstageable.  Picture above Follow-up on input from wound care nurse.  DVT prophylaxis; patient already on Coumadin  All the records are reviewed and case discussed with Care Management/Social Worker. Management plans discussed with the patient, family and they are in agreement.  CODE STATUS: DNR  TOTAL TIME TAKING CARE OF THIS PATIENT: 33 minutes.   More than 50% of the time was spent in counseling/coordination of care: YES  POSSIBLE D/C IN 1-2 DAYS, DEPENDING ON CLINICAL CONDITION.   Derec Mozingo M.D on 10/19/2018 at 11:55 AM  Between 7am to 6pm - Pager - [phone]  After 6pm go to [email or web address] - Proofreader  [facility]  Office  [phone]  CC: Primary care physician; [facility]  Note: This dictation was prepared with Dragon dictation along with  smaller phrase technology. Any transcriptional errors that result from this process are unintentional.

## 2018-10-19 NOTE — Consult Note (Signed)
[place] Nurse wound consult note Reason for Consult:right medial malleolar ulceration, full thickness Wound type: suspected venous insufficiency Pressure Injury POA: N/A Measurement:2cm x 1cm with depth not able to be assessed due to the presence of nonviable tissue (black) Wound [id number] tissue obscures wound depth Drainage (amount, consistency, odor)  Periwound:No edema today Dressing procedure/placement/frequency: I will provide Nursing with guidance for the care of this wound using a once daily application of an enzymatic debriding agent, collagenase (Santyl) topped with a saline-moistened gauze, covered with dry gauze and secured using a few turns of Kerlix roll gauze. A pressure redistribution heel boot is provided for elevation of the foot while in bed or chair.  Note: the boot is not to be used while ambulating.  Patient to follow up with his PCP or other provider of his choice for continuing care of this wound.  Expect collagenase to reveal a red wound bed that will be amenable to granulation and eventually reepithelialization. At that time, compression may be indicated.  [place] nursing team will not follow, but will remain available to this patient, the nursing and medical teams.  Please re-consult if needed. Thanks, Maudie Flakes, MSN, RN, [place], Arther Abbott  Pager# [phone]

## 2018-10-19 NOTE — Consult Note (Signed)
[place] for warfarin dosing  Indication: stroke  Allergies  Allergen Reactions  . Hydroxychloroquine Other (See Comments)    Other reaction(s): Other (See Comments)  Causing blindness Visual problems     Patient Measurements: Height: 6\' 5"  (195.6 cm) Weight: 238 lb (108 kg) IBW/kg (Calculated) : 89.1  Vital Signs: Temp: 97.9 F (36.6 C) (09/12 0515) Temp Source: Oral (09/12 0515) BP: 158/80 (09/12 0515) Pulse Rate: 71 (09/12 0515)  Labs: Recent Labs    10/18/18 1222 10/19/18 0550  HGB 12.9* 12.7*  HCT 39.1 40.0  PLT 212 178  APTT 61*  --   LABPROT 26.9* 29.3*  INR 2.5* 2.8*  CREATININE 1.14 1.02    Estimated Creatinine Clearance: 88.2 mL/min (by C-G formula based on SCr of 1.02 mg/dL).   Medical History: Past Medical History:  Diagnosis Date  . Alcohol abuse   . APS (antiphospholipid syndrome) ([place])   . BPH (benign prostatic hyperplasia)   . CAD (coronary artery disease)   . Cellulitis   . Cervical spinal stenosis   . Chronic osteomyelitis ([place])   . CKD (chronic kidney disease)   . CKD (chronic kidney disease)   . Clostridium difficile diarrhea   . Depression   . Diplopia   . Fatigue   . GERD (gastroesophageal reflux disease)   . Hyperlipemia   . Hypertension   . Hypovitaminosis D   . IBS (irritable bowel syndrome)   . 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 Insomnia   . Left lumbosacral radiculopathy   . Moderate obstructive sleep apnea   . Osteomyelitis of foot ([place])   . PAD (peripheral artery disease) ([place])   . Pruritus   . RA (rheumatoid arthritis) ([place])   . Radiculopathy   . Restless legs syndrome   . RLS (restless legs syndrome)   . SLE (systemic lupus erythematosus related syndrome) ([place])   . Stroke ([place])   . Toxic maculopathy of both eyes     Medications:  Scheduled:  . aspirin EC  81 mg Oral Daily  . atorvastatin  80 mg Oral QHS  . collagenase   Topical Daily  . FLUoxetine  40 mg Oral Daily  .  gabapentin  400 mg Oral QHS  . influenza vaccine adjuvanted  0.5 mL Intramuscular Tomorrow-1000  . losartan  25 mg Oral Daily  . montelukast  10 mg Oral Daily  . predniSONE  5 mg Oral Q breakfast  . QUEtiapine  25 mg Oral QHS  . warfarin  2.5 mg Oral Q M,W,F-1800  . warfarin  5 mg Oral Q T,Th,S,Su-1800  . Warfarin - Physician Dosing Inpatient   Does not apply q1800   Infusions:  . sodium chloride 75 mL/hr at 10/19/18 1552    Assessment: Pharmacy consulted for warfarin management for 73 yo male on warfarin for secondary stroke prevention. Patient takes warfarin 2.5mg  Monday/Wednesday/Friday and warfarin 5mg  Tuesday/Thursday/Saturday/Sunday for goal INR 2-3.   Goal of Therapy:  INR 2-3 Monitor platelets by anticoagulation protocol: Yes   Plan:  Patient received warfarin 2.5mg  on 9/11. Will order additional warfarin 2.5mg  on 9/12 and obtain INR with am labs.   Pharmacy will continue to monitor and adjust per consult.   Jsean Taussig L 10/19/2018,10:50 AM

## 2018-10-20 ENCOUNTER — Observation Stay: Payer: Medicare Other

## 2018-10-20 DIAGNOSIS — Z66 Do not resuscitate: Secondary | ICD-10-CM | POA: Diagnosis present

## 2018-10-20 DIAGNOSIS — Z20828 Contact with and (suspected) exposure to other viral communicable diseases: Secondary | ICD-10-CM | POA: Diagnosis present

## 2018-10-20 DIAGNOSIS — I2510 Atherosclerotic heart disease of native coronary artery without angina pectoris: Secondary | ICD-10-CM | POA: Diagnosis present

## 2018-10-20 DIAGNOSIS — E785 Hyperlipidemia, unspecified: Secondary | ICD-10-CM | POA: Diagnosis present

## 2018-10-20 DIAGNOSIS — G629 Polyneuropathy, unspecified: Secondary | ICD-10-CM | POA: Diagnosis present

## 2018-10-20 DIAGNOSIS — Z7952 Long term (current) use of systemic steroids: Secondary | ICD-10-CM | POA: Diagnosis not present

## 2018-10-20 DIAGNOSIS — M069 Rheumatoid arthritis, unspecified: Secondary | ICD-10-CM | POA: Diagnosis present

## 2018-10-20 DIAGNOSIS — I129 Hypertensive chronic kidney disease with stage 1 through stage 4 chronic kidney disease, or unspecified chronic kidney disease: Secondary | ICD-10-CM | POA: Diagnosis present

## 2018-10-20 DIAGNOSIS — N400 Enlarged prostate without lower urinary tract symptoms: Secondary | ICD-10-CM | POA: Diagnosis present

## 2018-10-20 DIAGNOSIS — Z7902 Long term (current) use of antithrombotics/antiplatelets: Secondary | ICD-10-CM | POA: Diagnosis not present

## 2018-10-20 DIAGNOSIS — L03115 Cellulitis of right lower limb: Secondary | ICD-10-CM | POA: Diagnosis present

## 2018-10-20 DIAGNOSIS — R05 Cough: Secondary | ICD-10-CM | POA: Diagnosis present

## 2018-10-20 DIAGNOSIS — Z9861 Coronary angioplasty status: Secondary | ICD-10-CM | POA: Diagnosis not present

## 2018-10-20 DIAGNOSIS — F329 Major depressive disorder, single episode, unspecified: Secondary | ICD-10-CM | POA: Diagnosis present

## 2018-10-20 DIAGNOSIS — Z87891 Personal history of nicotine dependence: Secondary | ICD-10-CM | POA: Diagnosis not present

## 2018-10-20 DIAGNOSIS — Z79899 Other long term (current) drug therapy: Secondary | ICD-10-CM | POA: Diagnosis not present

## 2018-10-20 DIAGNOSIS — Z8673 Personal history of transient ischemic attack (TIA), and cerebral infarction without residual deficits: Secondary | ICD-10-CM | POA: Diagnosis not present

## 2018-10-20 DIAGNOSIS — G2581 Restless legs syndrome: Secondary | ICD-10-CM | POA: Diagnosis present

## 2018-10-20 DIAGNOSIS — A419 Sepsis, unspecified organism: Secondary | ICD-10-CM | POA: Diagnosis present

## 2018-10-20 DIAGNOSIS — M329 Systemic lupus erythematosus, unspecified: Secondary | ICD-10-CM | POA: Diagnosis present

## 2018-10-20 DIAGNOSIS — N189 Chronic kidney disease, unspecified: Secondary | ICD-10-CM | POA: Diagnosis present

## 2018-10-20 DIAGNOSIS — Z23 Encounter for immunization: Secondary | ICD-10-CM | POA: Diagnosis present

## 2018-10-20 DIAGNOSIS — Z7982 Long term (current) use of aspirin: Secondary | ICD-10-CM | POA: Diagnosis not present

## 2018-10-20 DIAGNOSIS — I739 Peripheral vascular disease, unspecified: Secondary | ICD-10-CM | POA: Diagnosis present

## 2018-10-20 DIAGNOSIS — L97319 Non-pressure chronic ulcer of right ankle with unspecified severity: Secondary | ICD-10-CM | POA: Diagnosis present

## 2018-10-20 LAB — PROTIME-INR
INR: 2.5 — ABNORMAL HIGH (ref 0.8–1.2)
Prothrombin Time: 26.5 seconds — ABNORMAL HIGH (ref 11.4–15.2)

## 2018-10-20 LAB — CBC
HCT: 37 % — ABNORMAL LOW (ref 39.0–52.0)
Hemoglobin: 12.1 g/dL — ABNORMAL LOW (ref 13.0–17.0)
MCH: 29.5 pg (ref 26.0–34.0)
MCHC: 32.7 g/dL (ref 30.0–36.0)
MCV: 90.2 fL (ref 80.0–100.0)
Platelets: 165 10*3/uL (ref 150–400)
RBC: 4.1 MIL/uL — ABNORMAL LOW (ref 4.22–5.81)
RDW: 13.2 % (ref 11.5–15.5)
WBC: 7.6 10*3/uL (ref 4.0–10.5)
nRBC: 0 % (ref 0.0–0.2)

## 2018-10-20 LAB — URINE CULTURE: Culture: NO GROWTH

## 2018-10-20 MED ORDER — COLLAGENASE 250 UNIT/GM EX OINT
TOPICAL_OINTMENT | Freq: Every day | CUTANEOUS | Status: DC
  Administered 2018-10-20 – 2018-10-21 (×2): via TOPICAL
  Filled 2018-10-20: qty 30

## 2018-10-20 MED ORDER — PNEUMOCOCCAL VAC POLYVALENT 25 MCG/0.5ML IJ INJ
0.5000 mL | INJECTION | INTRAMUSCULAR | Status: AC
  Administered 2018-10-21: 10:00:00 0.5 mL via INTRAMUSCULAR
  Filled 2018-10-20: qty 0.5

## 2018-10-20 MED ORDER — SODIUM CHLORIDE 0.9 % IV SOLN
1.5000 g | Freq: Four times a day (QID) | INTRAVENOUS | Status: DC
  Administered 2018-10-20 – 2018-10-21 (×5): 1.5 g via INTRAVENOUS
  Filled 2018-10-20 (×2): qty 4
  Filled 2018-10-20 (×3): qty 1.5
  Filled 2018-10-20: qty 4
  Filled 2018-10-20 (×2): qty 1.5

## 2018-10-20 MED ORDER — WARFARIN SODIUM 2.5 MG PO TABS
2.5000 mg | ORAL_TABLET | ORAL | Status: DC
  Filled 2018-10-20: qty 1

## 2018-10-20 MED ORDER — WARFARIN SODIUM 5 MG PO TABS
5.0000 mg | ORAL_TABLET | ORAL | Status: DC
  Administered 2018-10-20: 5 mg via ORAL
  Filled 2018-10-20: qty 1

## 2018-10-20 NOTE — Progress Notes (Signed)
Kenneth Summers at [place] NAME: Kenneth Summers    MR#:  [id number]  DATE OF BIRTH:  08-26-1945  SUBJECTIVE:  CHIEF COMPLAINT:   Chief Complaint  Patient presents with  . Cough  . Fever   No new complaint this morning.  Patient noted to have had a fever with temperature of 100.5 last night.  Antibiotics initiated.  Seen by wound care nurse yesterday  REVIEW OF SYSTEMS:  Review of Systems  Constitutional: Negative for chills and fever.  HENT: Negative for hearing loss and tinnitus.   Eyes: Negative for blurred vision and double vision.  Respiratory: Negative for cough and hemoptysis.   Cardiovascular: Negative for chest pain and palpitations.  Gastrointestinal: Negative for abdominal pain, heartburn, nausea and vomiting.  Genitourinary: Negative for dysuria and urgency.  Musculoskeletal: Negative for myalgias and neck pain.  Skin: Negative for itching and rash.  Neurological: Negative for dizziness and headaches.  Psychiatric/Behavioral: Negative for depression and substance abuse.    DRUG ALLERGIES:   Allergies  Allergen Reactions  . Hydroxychloroquine Other (See Comments)    Other reaction(s): Other (See Comments)  Causing blindness Visual problems    VITALS:  Blood pressure (!) 163/84, pulse 90, temperature 98.3 F (36.8 C), temperature source Oral, resp. rate 18, height 6\' 5"  (1.956 m), weight 108 kg, SpO2 99 %. PHYSICAL EXAMINATION:   Physical Exam  Constitutional: He is oriented to person, place, and time and well-developed, well-nourished, and in no distress.  HENT:  Head: Normocephalic and atraumatic.  Right Ear: External ear normal.  Eyes: Pupils are equal, round, and reactive to light. Conjunctivae are normal.  Neck: Normal range of motion. Neck supple. No tracheal deviation present.  Cardiovascular: Normal rate, regular rhythm and normal heart sounds.  Pulmonary/Chest: Effort normal and breath sounds normal. No  respiratory distress.  Abdominal: Soft. Bowel sounds are normal. He exhibits no distension.  Musculoskeletal: Normal range of motion.        General: No edema.     Comments: Also around the right ankle with pictures of the wound below.  Neurological: He is alert and oriented to person, place, and time.  Sleepy but easily arousable.  Skin: Skin is warm and dry. He is not diaphoretic.  Psychiatric: Affect and judgment normal.      LABORATORY PANEL:  Male CBC Recent Labs  Lab 10/20/18 0535  WBC 7.6  HGB 12.1*  HCT 37.0*  PLT 165   ------------------------------------------------------------------------------------------------------------------ Chemistries  Recent Labs  Lab 10/18/18 1222 10/19/18 0550  NA 136 141  K 4.3 4.5  CL 103 108  CO2 24 24  GLUCOSE 126* 92  BUN 26* 28*  CREATININE 1.14 1.02  CALCIUM 8.6* 8.4*  AST 23  --   ALT 21  --   ALKPHOS 99  --   BILITOT 0.9  --    RADIOLOGY:  No results found. ASSESSMENT AND PLAN:   73 y.o. male with a known history of coronary artery disease, BPH, history of antiphospholipid syndrome, hypertension, hyperlipidemia, IBS, cervical spinal stenosis, peripheral artery disease, rheumatoid arthritis, restless leg syndrome who presented to the hospital due to fever, cough.  1.  Sepsis-patient meets criteria given fever, leukocytosis on admission.   Likely due to right ankle ulcer with surrounding area of cellulitis.  No other source of infectious process found so far. So far no growth on cultures. [name] chest x-ray is negative, urinalysis is negative and COVID-19 test is also negative.  Respiratory  panel negative. - Patient received 1 dose of broad-spectrum antibiotics including IV vancomycin, cefepime and Flagyl and antibiotics was initially placed on hold to monitor for repeat fevers. Patient had a temperature 100.5 last night.  Initiated empiric antibiotics with IV Unasyn  2.  Leukocytosis-suspected to be secondary  to sepsis. Resolved. Patient is also on chronic prednisone which may be contributing to this.  3.  History of restless leg syndrome-continue patient's Requip.  4.  Depression-continue Prozac.    5.  History of antiphospholipid syndrome-continue patient's Coumadin.  INR is therapeutic at 2.5.  6.  Essential hypertension-continue losartan.  7.  Neuropathy-continue gabapentin.  8.  Hyperlipidemia-continue atorvastatin.  9.  Right ankle ulcer.;  Unstageable.   Seen by wound care nurse.  Measures 2cm x 1cm with depth not able to be assessed due to the presence of nonviable tissue (black) Continue collagenase (Santyl) topped with a saline-moistened gauze, covered with dry gauze and secured using a few turns of Kerlix roll gauze. A pressure redistribution heel boot is provided for elevation of the foot while in bed or chair.  the boot is not to be used while ambulating.  DVT prophylaxis; patient already on Coumadin  All the records are reviewed and case discussed with Care Management/Social Worker. Management plans discussed with the patient, family and they are in agreement. I called and updated patient's daughter Ms. [name] this morning.  She confirmed patient is mostly bed and wheelchair bound at baseline but has physical therapy and Occupational Therapy working with him at home.  I have requested for physical therapy and OT to evaluate during this hospitalization.   CODE STATUS: DNR  TOTAL TIME TAKING CARE OF THIS PATIENT: 33 minutes.   More than 50% of the time was spent in counseling/coordination of care: YES  POSSIBLE D/C IN 1-2 DAYS, DEPENDING ON CLINICAL CONDITION.   Aleesa Sweigert M.D on 10/20/2018 at 9:45 AM  Between 7am to 6pm - Pager - [phone]  After 6pm go to [email or web address] - Proofreader  [facility]  Office  [phone]  CC: Primary care physician; [facility]  Note: This dictation was prepared with Dragon  dictation along with smaller phrase technology. Any transcriptional errors that result from this process are unintentional.

## 2018-10-20 NOTE — Consult Note (Signed)
[facility] for warfarin dosing  Indication: stroke  Allergies  Allergen Reactions  . Hydroxychloroquine Other (See Comments)    Other reaction(s): Other (See Comments)  Causing blindness Visual problems     Patient Measurements: Height: 6\' 5"  (195.6 cm) Weight: 238 lb (108 kg) IBW/kg (Calculated) : 89.1  Vital Signs: Temp: 98.3 F (36.8 C) (09/13 0352) Temp Source: Oral (09/13 0352) BP: 163/84 (09/13 0352) Pulse Rate: 90 (09/13 0352)  Labs: Recent Labs    10/18/18 1222 10/19/18 0550 10/20/18 0535  HGB 12.9* 12.7* 12.1*  HCT 39.1 40.0 37.0*  PLT 212 178 165  APTT 61*  --   --   LABPROT 26.9* 29.3* 26.5*  INR 2.5* 2.8* 2.5*  CREATININE 1.14 1.02  --     Estimated Creatinine Clearance: 88.2 mL/min (by C-G formula based on SCr of 1.02 mg/dL).   Medical History: Past Medical History:  Diagnosis Date  . Alcohol abuse   . APS (antiphospholipid syndrome) ([place])   . BPH (benign prostatic hyperplasia)   . CAD (coronary artery disease)   . Cellulitis   . Cervical spinal stenosis   . Chronic osteomyelitis ([place])   . CKD (chronic kidney disease)   . CKD (chronic kidney disease)   . Clostridium difficile diarrhea   . Depression   . Diplopia   . Fatigue   . GERD (gastroesophageal reflux disease)   . Hyperlipemia   . Hypertension   . Hypovitaminosis D   . IBS (irritable bowel syndrome)   . 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 Insomnia   . Left lumbosacral radiculopathy   . Moderate obstructive sleep apnea   . Osteomyelitis of foot ([place])   . PAD (peripheral artery disease) ([place])   . Pruritus   . RA (rheumatoid arthritis) ([place])   . Radiculopathy   . Restless legs syndrome   . RLS (restless legs syndrome)   . SLE (systemic lupus erythematosus related syndrome) ([place])   . Stroke ([place])   . Toxic maculopathy of both eyes     Medications:  Scheduled:  . aspirin EC  81 mg Oral Daily  . atorvastatin  80 mg Oral QHS  . [START ON  10/21/2018] collagenase   Topical Q0600  . FLUoxetine  40 mg Oral Daily  . gabapentin  400 mg Oral QHS  . influenza vaccine adjuvanted  0.5 mL Intramuscular Tomorrow-1000  . losartan  25 mg Oral Daily  . montelukast  10 mg Oral Daily  . predniSONE  5 mg Oral Q breakfast  . QUEtiapine  25 mg Oral QHS  . Warfarin - Pharmacist Dosing Inpatient   Does not apply q1800   Infusions:  . sodium chloride 75 mL/hr at 10/20/18 0854  . ampicillin-sulbactam (UNASYN) IV 1.5 g (10/20/18 0859)    Assessment: Pharmacy consulted for warfarin management for 73 yo male on warfarin for secondary stroke prevention. Patient takes warfarin 2.5mg  Monday/Wednesday/Friday and warfarin 5mg  Tuesday/Thursday/Saturday/Sunday for goal INR 2-3.   Goal of Therapy:  INR 2-3 Monitor platelets by anticoagulation protocol: Yes   Plan:  Will initiate home regimen and obtain INR with am labs.   Pharmacy will continue to monitor and adjust per consult.   Simpson,Michael L 10/20/2018,10:15 AM

## 2018-10-20 NOTE — Progress Notes (Signed)
Physical Therapy Evaluation Patient Details Name: Kenneth Summers MRN: [id number] DOB: 1945-04-13 Today's Date: 10/20/2018   History of Present Illness  Kenneth Summers  is a 73 y.o. male with PMHx of coronary artery disease, BPH, history of antiphospholipid syndrome, hypertension, hyperlipidemia, IBS, cervical spinal stenosis, peripheral artery disease, rheumatoid arthritis, restless leg syndrome who presented to the hospital due to fever, cough.  Clinical Impression  Patient presents with a fever and cough. He has -3/5 strength to RLE hip flex and poor strength LLE.  He needs max assist for rolling left and right with max cueing for sequencing. He uses a hoyer lift at home for transfers and a power wc for mobility. He needs max assist x 2 for supine to sit EOB PLOF. Unable to assess sitting mobility due to patient needing personal hygiene care.Patient will benefit from skilled PT to improve strength and mobility.    Follow Up Recommendations Outpatient PT    Equipment Recommendations  None recommended by PT    Recommendations for Other Services       Precautions / Restrictions Restrictions Weight Bearing Restrictions: No      Mobility  Bed Mobility Overal bed mobility: Needs Assistance Bed Mobility: Rolling Rolling: Max assist         General bed mobility comments: Patient reaches with arm only and does not push from legs to roll over left or right  Transfers Overall transfer level: (Patient uses a hoyer lift at home)                  Ambulation/Gait                Stairs            Wheelchair Mobility    Modified Rankin (Stroke Patients Only)       Balance Overall balance assessment: Needs assistance Sitting-balance support: (NT )                                         Pertinent Vitals/Pain      Home Living Family/patient expects to be discharged to:: Private residence Living Arrangements: Children Available Help  at Discharge: Family Type of Home: House                Prior Function Level of Independence: Needs assistance   Gait / Transfers Assistance Needed: (has power wc, hoyer lift)  ADL's / Homemaking Assistance Needed: dependent for ADLs/ homemakeing        Hand Dominance        Extremity/Trunk Assessment   Upper Extremity Assessment Upper Extremity Assessment: Defer to OT evaluation    Lower Extremity Assessment Lower Extremity Assessment: LLE deficits/detail LLE Deficits / Details: hip flex -3/5, abd -3/5 LLE Coordination: decreased gross motor       Communication   Communication: No difficulties  Cognition Arousal/Alertness: Awake/alert Behavior During Therapy: WFL for tasks assessed/performed Overall Cognitive Status: History of cognitive impairments - at baseline                                        General Comments      Exercises     Assessment/Plan    PT Assessment Patient needs continued PT services  PT Problem List Decreased strength;Decreased activity tolerance;Decreased balance;Decreased coordination;Decreased cognition;Decreased knowledge of  use of DME;Decreased safety awareness;Decreased knowledge of precautions       PT Treatment Interventions Balance training;Therapeutic exercise;Therapeutic activities    PT Goals (Current goals can be found in the Care Plan section)  Acute Rehab PT Goals Patient Stated Goal: no goals stated PT Goal Formulation: With patient Time For Goal Achievement: 11/03/18 Potential to Achieve Goals: Fair    Frequency Min 2X/week   Barriers to discharge        Co-evaluation               AM-PAC PT "6 Clicks" Mobility  Outcome Measure Help needed turning from your back to your side while in a flat bed without using bedrails?: Total Help needed moving from lying on your back to sitting on the side of a flat bed without using bedrails?: Total Help needed moving to and from a bed to a  chair (including a wheelchair)?: Total Help needed standing up from a chair using your arms (e.g., wheelchair or bedside chair)?: Total Help needed to walk in hospital room?: Total Help needed climbing 3-5 steps with a railing? : Total 6 Click Score: 6    End of Session         PT Visit Diagnosis: Muscle weakness (generalized) (M62.81)    Time: [id number] PT Time Calculation (min) (ACUTE ONLY): 20 min   Charges:   PT Evaluation $PT Eval Low Complexity: 1 Low           [place], Sherryl Barters, PT DPT 10/20/2018, 2:27 PM

## 2018-10-21 ENCOUNTER — Ambulatory Visit: Payer: Medicare Other | Admitting: Occupational Therapy

## 2018-10-21 ENCOUNTER — Ambulatory Visit: Payer: Medicare Other | Admitting: Physical Therapy

## 2018-10-21 LAB — PROTIME-INR
INR: 2.4 — ABNORMAL HIGH (ref 0.8–1.2)
Prothrombin Time: 25.8 seconds — ABNORMAL HIGH (ref 11.4–15.2)

## 2018-10-21 MED ORDER — COLLAGENASE 250 UNIT/GM EX OINT: TOPICAL_OINTMENT | Freq: Every day | CUTANEOUS | 0 refills | Status: AC

## 2018-10-21 MED ORDER — AMOXICILLIN-POT CLAVULANATE 875-125 MG PO TABS: 1.0000 | ORAL_TABLET | Freq: Two times a day (BID) | ORAL | 0 refills | Status: AC

## 2018-10-21 MED ORDER — GUAIFENESIN 100 MG/5ML PO SOLN: 5.0000 mL | ORAL | 0 refills | Status: AC | PRN

## 2018-10-21 NOTE — TOC Transition Note (Signed)
Transition of Care [facility]) - CM/SW Discharge Note   Patient Details  Name: Kenneth Summers MRN: [id number] Date of Birth: 1945/07/03  [facility] CM/SW Contact:  Shelbie Hutching, RN Phone Number: 10/21/2018, 11:38 AM   Clinical Narrative:     Patient will discharge home in the care of his wife and daughter.  Patient will transport home via [facility].    Final next level of care: Home/Self Care Barriers to Discharge: Barriers Resolved   Patient Goals and CMS Choice        Discharge Placement                       Discharge Plan and Services   Discharge Planning Services: CM Consult                                 Social Determinants of Health (SDOH) Interventions     Readmission Risk Interventions No flowsheet data found.

## 2018-10-21 NOTE — Consult Note (Addendum)
[place] for warfarin dosing  Indication: stroke  Allergies  Allergen Reactions  . Hydroxychloroquine Other (See Comments)    Other reaction(s): Other (See Comments)  Causing blindness Visual problems     Patient Measurements: Height: 6\' 5"  (195.6 cm) Weight: 238 lb (108 kg) IBW/kg (Calculated) : 89.1  Vital Signs: Temp: 97.5 F (36.4 C) (09/14 0400) Temp Source: Oral (09/14 0400) BP: 143/79 (09/14 0400) Pulse Rate: 68 (09/14 0400)  Labs: Recent Labs    10/18/18 1222 10/19/18 0550 10/20/18 0535 10/21/18 0416  HGB 12.9* 12.7* 12.1*  --   HCT 39.1 40.0 37.0*  --   PLT 212 178 165  --   APTT 61*  --   --   --   LABPROT 26.9* 29.3* 26.5* 25.8*  INR 2.5* 2.8* 2.5* 2.4*  CREATININE 1.14 1.02  --   --     Estimated Creatinine Clearance: 88.2 mL/min (by C-G formula based on SCr of 1.02 mg/dL).   Medical History: Past Medical History:  Diagnosis Date  . Alcohol abuse   . APS (antiphospholipid syndrome) ([place])   . BPH (benign prostatic hyperplasia)   . CAD (coronary artery disease)   . Cellulitis   . Cervical spinal stenosis   . Chronic osteomyelitis ([place])   . CKD (chronic kidney disease)   . CKD (chronic kidney disease)   . Clostridium difficile diarrhea   . Depression   . Diplopia   . Fatigue   . GERD (gastroesophageal reflux disease)   . Hyperlipemia   . Hypertension   . Hypovitaminosis D   . IBS (irritable bowel syndrome)   . 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 Insomnia   . Left lumbosacral radiculopathy   . Moderate obstructive sleep apnea   . Osteomyelitis of foot ([place])   . PAD (peripheral artery disease) ([place])   . Pruritus   . RA (rheumatoid arthritis) ([place])   . Radiculopathy   . Restless legs syndrome   . RLS (restless legs syndrome)   . SLE (systemic lupus erythematosus related syndrome) ([place])   . Stroke ([place])   . Toxic maculopathy of both eyes     Medications:  Scheduled:  . aspirin EC  81 mg Oral Daily  .  atorvastatin  80 mg Oral QHS  . collagenase   Topical Q0600  . FLUoxetine  40 mg Oral Daily  . gabapentin  400 mg Oral QHS  . influenza vaccine adjuvanted  0.5 mL Intramuscular Tomorrow-1000  . losartan  25 mg Oral Daily  . montelukast  10 mg Oral Daily  . pneumococcal 23 valent vaccine  0.5 mL Intramuscular Tomorrow-1000  . predniSONE  5 mg Oral Q breakfast  . QUEtiapine  25 mg Oral QHS  . warfarin  2.5 mg Oral Q M,W,F-1800  . warfarin  5 mg Oral Q T,Th,S,Su-1800  . Warfarin - Pharmacist Dosing Inpatient   Does not apply q1800   Infusions:  . sodium chloride 75 mL/hr at 10/21/18 0300  . ampicillin-sulbactam (UNASYN) IV Stopped (10/21/18 [id number])    Assessment: Pharmacy consulted for warfarin management for 73 yo male on warfarin for secondary stroke prevention. Patient takes warfarin 2.5mg  Monday/Wednesday/Friday and warfarin 5mg  Tuesday/Thursday/Saturday/Sunday for goal INR 2-3.    INR Warfarin Dose 9/11  2.5 2.5mg  9/12 2.8 2.5mg  9/13 2.5     5mg  9/14 2.4  Goal of Therapy:  INR 2-3 Monitor platelets by anticoagulation protocol: Yes   Plan:  Will continue home regimen and obtain INR  with am labs.   Will give 2.5mg  tab for this evening's dose.  Pharmacy will continue to monitor and adjust per consult.   Lu Duffel, PharmD, BCPS Clinical Pharmacist 10/21/2018 7:48 AM

## 2018-10-21 NOTE — Progress Notes (Signed)
Pt being discharged, discharge papers in packet, discharge instructions and prescriptions also reviewed with pts daughter [name], states understanding, pt with no complaints

## 2018-10-21 NOTE — Discharge Summary (Signed)
[place] [facility] [place] NAME: Kenneth Summers    MR#:  [id number]  DATE OF BIRTH:  1945/05/18  DATE OF ADMISSION:  10/18/2018   ADMITTING PHYSICIAN: Henreitta Leber, MD  DATE OF DISCHARGE: 10/21/2018  PRIMARY CARE PHYSICIAN: [place], [facility]   ADMISSION DIAGNOSIS:  Cough [R05] Upper respiratory tract infection, unspecified type [J06.9] Sepsis, due to unspecified organism, unspecified whether acute organ dysfunction present ([place]) [A41.9] DISCHARGE DIAGNOSIS:  Active Problems:   Sepsis ([place])  SECONDARY DIAGNOSIS:   Past Medical History:  Diagnosis Date  . Alcohol abuse   . APS (antiphospholipid syndrome) ([place])   . BPH (benign prostatic hyperplasia)   . CAD (coronary artery disease)   . Cellulitis   . Cervical spinal stenosis   . Chronic osteomyelitis ([place])   . CKD (chronic kidney disease)   . CKD (chronic kidney disease)   . Clostridium difficile diarrhea   . Depression   . Diplopia   . Fatigue   . GERD (gastroesophageal reflux disease)   . Hyperlipemia   . Hypertension   . Hypovitaminosis D   . IBS (irritable bowel syndrome)   . 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 Insomnia   . Left lumbosacral radiculopathy   . Moderate obstructive sleep apnea   . Osteomyelitis of foot ([place])   . PAD (peripheral artery disease) ([place])   . Pruritus   . RA (rheumatoid arthritis) ([place])   . Radiculopathy   . Restless legs syndrome   . RLS (restless legs syndrome)   . SLE (systemic lupus erythematosus related syndrome) ([place])   . Stroke ([place])   . Toxic maculopathy of both eyes    HOSPITAL COURSE:  Chief complaint; fever and cough  History of presenting complaint; Kenneth Summers  is a 74 y.o. male with a known history of coronary artery disease, BPH, history of antiphospholipid syndrome, hypertension, hyperlipidemia, IBS, cervical spinal stenosis, peripheral artery disease, rheumatoid arthritis, restless leg syndrome who presented to the  hospital due to fever, cough. He presented to the ER was noted to have a fever of 103 and leukocytosis but no source of the fever was identified.  [name] COVID-19 test is negative, his chest x-ray is negative for pneumonia, urinalysis is negative.  Patient diagnosed with sepsis and admitted to medical service.  Hospital course; 1. Sepsis-patient met criteria given fever, leukocytosis on admission.  Likely due to right ankle ulcer with surrounding area of cellulitis.  No other source of infectious process found so far.So far no growth on cultures. [name] chest x-ray is negative, urinalysis is negative and COVID-19 test is also negative.  Respiratory panel negative.  Patient was treated with IV antibiotics initially with vancomycin and later with IV Unasyn.  Has remained afebrile for more than 24 hours. Repeat chest x-ray done yesterday with no evidence of infiltrates.  Patient presumed to have acute bronchitis and should improve with antibiotics as already being on antibiotics.  Chest x-ray however did reveal AP window fullness.  AP window lymphadenopathy not entirely excluded.  Recommendation was to consider close follow-up with CT chest after patient completes empiric antibiotics.  I informed patient's daughter to have primary care physician to do CT chest after patient completes antibiotics treatment for acute bronchitis and if cough persists.  2. Leukocytosis-suspected to be secondary to sepsis. Resolved. 3. History of restless leg syndrome-continue patient's Requip. 4. Depression-continue Prozac.  5. History of antiphospholipid syndrome-continue patient's Coumadin. INR is therapeutic at 2.4. 6. Essential  hypertension-continue losartan. 7. Neuropathy-continue gabapentin. 8. Hyperlipidemia-continue atorvastatin. 9.  Right ankle ulcer.;  Unstageable.   Seen by wound care nurse.  Measures 2cm x 1cm with depth not able to be assessed due to the presence of nonviable tissue  (black) Continue collagenase (Santyl) topped with a saline-moistened gauze, covered with dry gauze and secured using a few turns of Kerlix roll gauze.  Patient has debility due to multiple medical problems listed above.  Seen by physical therapist.  Recommendation is to resume his outpatient physical therapy on discharge from the hospital. I called and updated patient's daughter on treatment and discharge plans today.  All questions were answered.  DISCHARGE CONDITIONS:  Stable CONSULTS OBTAINED:   DRUG ALLERGIES:   Allergies  Allergen Reactions  . Hydroxychloroquine Other (See Comments)    Other reaction(s): Other (See Comments)  Causing blindness Visual problems    DISCHARGE MEDICATIONS:   Allergies as of 10/21/2018      Reactions   Hydroxychloroquine Other (See Comments)   Other reaction(s): Other (See Comments)  Causing blindness Visual problems      Medication List    STOP taking these medications   traZODone 50 MG tablet Commonly known as: DESYREL     TAKE these medications   amoxicillin-clavulanate 875-125 MG tablet Commonly known as: Augmentin Take 1 tablet by mouth 2 (two) times daily for 5 days.   aspirin EC 81 MG tablet Take by mouth daily.   atorvastatin 80 MG tablet Commonly known as: LIPITOR Take 80 mg by mouth Nightly.   collagenase ointment Commonly known as: SANTYL Apply topically daily at 6 (six) AM for 7 days. Start taking on: October 22, 2018   FLUoxetine 40 MG capsule Commonly known as: PROZAC Take 40 mg by mouth daily.   gabapentin 400 MG capsule Commonly known as: NEURONTIN Take 400 mg by mouth at bedtime.   guaiFENesin 100 MG/5ML Soln Commonly known as: ROBITUSSIN Take 5 mLs (100 mg total) by mouth every 4 (four) hours as needed for up to 5 days for cough or to loosen phlegm.   losartan 25 MG tablet Commonly known as: COZAAR Take 25 mg by mouth daily.   Melatonin 3 MG Tabs Take 6 mg by mouth at bedtime.   montelukast 10  MG tablet Commonly known as: SINGULAIR Take 10 mg by mouth daily.   predniSONE 5 MG tablet Commonly known as: DELTASONE Take 5 mg by mouth daily with breakfast.   QUEtiapine 25 MG tablet Commonly known as: SEROQUEL Take 25-50 mg at night   rOPINIRole 2 MG tablet Commonly known as: REQUIP Take 2 mg by mouth at bedtime as needed.   warfarin 5 MG tablet Commonly known as: COUMADIN Take 5 mg by mouth 4 (four) times a week. Tues, thurs,sat sun   warfarin 2.5 MG tablet Commonly known as: COUMADIN Take 2.5 mg by mouth 3 (three) times a week. Mon, wed, fri        DISCHARGE INSTRUCTIONS:   DIET:  Cardiac diet DISCHARGE CONDITION:  Stable ACTIVITY:  Activity as tolerated OXYGEN:  Home Oxygen: No.  Oxygen Delivery: room air DISCHARGE LOCATION:  home   If you experience worsening of your admission symptoms, develop shortness of breath, life threatening emergency, suicidal or homicidal thoughts you must seek medical attention immediately by calling 911 or calling your MD immediately  if symptoms less severe.  You Must read complete instructions/literature along with all the possible adverse reactions/side effects for all the Medicines you take and that have  been prescribed to you. Take any new Medicines after you have completely understood and accpet all the possible adverse reactions/side effects.   Please note  You were cared for by a hospitalist during your hospital stay. If you have any questions about your discharge medications or the care you received while you were in the hospital after you are discharged, you can call the unit and asked to speak with the hospitalist on call if the hospitalist that took care of you is not available. Once you are discharged, your primary care physician will handle any further medical issues. Please note that NO REFILLS for any discharge medications will be authorized once you are discharged, as it is imperative that you return to your primary  care physician (or establish a relationship with a primary care physician if you do not have one) for your aftercare needs so that they can reassess your need for medications and monitor your lab values.    On the day of Discharge:  VITAL SIGNS:  Blood pressure (!) 143/79, pulse 68, temperature (!) 97.5 F (36.4 C), temperature source Oral, resp. rate 18, height '6\' 5"'$  (1.956 m), weight 108 kg, SpO2 93 %. PHYSICAL EXAMINATION:  GENERAL:  73 y.o.-year-old patient lying in the bed with no acute distress.  EYES: Pupils equal, round, reactive to light and accommodation. No scleral icterus. Extraocular muscles intact.  HEENT: Head atraumatic, normocephalic. Oropharynx and nasopharynx clear.  NECK:  Supple, no jugular venous distention. No thyroid enlargement, no tenderness.  LUNGS: Normal breath sounds bilaterally, no wheezing, rales,rhonchi or crepitation. No use of accessory muscles of respiration.  CARDIOVASCULAR: S1, S2 normal. No murmurs, rubs, or gallops.  ABDOMEN: Soft, non-tender, non-distended. Bowel sounds present. No organomegaly or mass.  EXTREMITIES: No pedal edema, cyanosis, or clubbing.  NEUROLOGIC: Cranial nerves II through XII are intact. Muscle strength 5/5 in all extremities. Sensation intact. Gait not checked.  PSYCHIATRIC: The patient is alert and oriented x 3.  SKIN: No obvious rash, lesion, or ulcer.  DATA REVIEW:   CBC Recent Labs  Lab 10/20/18 0535  WBC 7.6  HGB 12.1*  HCT 37.0*  PLT 165    Chemistries  Recent Labs  Lab 10/18/18 1222 10/19/18 0550  NA 136 141  K 4.3 4.5  CL 103 108  CO2 24 24  GLUCOSE 126* 92  BUN 26* 28*  CREATININE 1.14 1.02  CALCIUM 8.6* 8.4*  AST 23  --   ALT 21  --   ALKPHOS 99  --   BILITOT 0.9  --      Microbiology Results  Results for orders placed or performed during the hospital encounter of 10/18/18  Blood Culture (routine x 2)     Status: None (Preliminary result)   Collection Time: 10/18/18 12:22 PM   Specimen:  BLOOD  Result Value Ref Range Status   Specimen Description BLOOD BLOOD RIGHT FOREARM  Final   Special Requests   Final    BOTTLES DRAWN AEROBIC AND ANAEROBIC Blood Culture results may not be optimal due to an excessive volume of blood received in culture bottles   Culture   Final    NO GROWTH 3 DAYS Performed at [facility], [address] [place], [place] [address]    Report Status PENDING  Incomplete  Blood Culture (routine x 2)     Status: None (Preliminary result)   Collection Time: 10/18/18 12:22 PM   Specimen: BLOOD  Result Value Ref Range Status   Specimen Description BLOOD BLOOD  RIGHT WRIST  Final   Special Requests   Final    BOTTLES DRAWN AEROBIC AND ANAEROBIC Blood Culture results may not be optimal due to an excessive volume of blood received in culture bottles   Culture   Final    NO GROWTH 3 DAYS Performed at [facility], [address], [place], [place] [address]    Report Status PENDING  Incomplete  Urine culture     Status: None   Collection Time: 10/18/18 12:22 PM   Specimen: Urine, Catheterized  Result Value Ref Range Status   Specimen Description   Final    URINE, CATHETERIZED Performed at [facility], [address], [place], [place] [address]    Special Requests   Final    NONE Performed at [facility], [address], [place], [place] [address]    Culture   Final    NO GROWTH Performed at [facility], [place] [address], [place], [place] [address]    Report Status 10/20/2018 FINAL  Final  SARS Coronavirus 2 [facility] order, Performed in [facility] hospital lab) Nasopharyngeal Nasopharyngeal Swab     Status: None   Collection Time: 10/18/18 12:38 PM   Specimen: Nasopharyngeal Swab  Result Value Ref Range Status   SARS Coronavirus 2 NEGATIVE NEGATIVE Final    Comment: (NOTE) If result is NEGATIVE SARS-CoV-2 target nucleic acids are NOT DETECTED. The SARS-CoV-2 RNA is generally detectable in upper and  lower  respiratory specimens during the acute phase of infection. The lowest  concentration of SARS-CoV-2 viral copies this assay can detect is 250  copies / mL. A negative result does not preclude SARS-CoV-2 infection  and should not be used as the sole basis for treatment or other  patient management decisions.  A negative result may occur with  improper specimen collection / handling, submission of specimen other  than nasopharyngeal swab, presence of viral mutation(s) within the  areas targeted by this assay, and inadequate number of viral copies  (<250 copies / mL). A negative result must be combined with clinical  observations, patient history, and epidemiological information. If result is POSITIVE SARS-CoV-2 target nucleic acids are DETECTED. The SARS-CoV-2 RNA is generally detectable in upper and lower  respiratory specimens dur ing the acute phase of infection.  Positive  results are indicative of active infection with SARS-CoV-2.  Clinical  correlation with patient history and other diagnostic information is  necessary to determine patient infection status.  Positive results do  not rule out bacterial infection or co-infection with other viruses. If result is PRESUMPTIVE POSTIVE SARS-CoV-2 nucleic acids MAY BE PRESENT.   A presumptive positive result was obtained on the submitted specimen  and confirmed on repeat testing.  While 2019 novel coronavirus  (SARS-CoV-2) nucleic acids may be present in the submitted sample  additional confirmatory testing may be necessary for epidemiological  and / or clinical management purposes  to differentiate between  SARS-CoV-2 and other Sarbecovirus currently known to infect humans.  If clinically indicated additional testing with an alternate test  methodology [phone] is advised. The SARS-CoV-2 RNA is generally  detectable in upper and lower respiratory sp ecimens during the acute  phase of infection. The expected result is Negative.  Fact Sheet for Patients:  StrictlyIdeas.no Fact Sheet for Healthcare Providers: BankingDealers.co.za This test is not yet approved or cleared by the [place] FDA and has been authorized for detection and/or diagnosis of SARS-CoV-2 by FDA under an Emergency Use Authorization (EUA).  This EUA will remain in effect (meaning this test can be used) for the duration of the COVID-19 declaration under Section 564(b)(1) of the Act, 21 U.S.C. section 360bbb-3(b)(1), unless the authorization is terminated or revoked sooner. Performed at [facility], [address] [place], [place] [address]   Respiratory Panel by PCR     Status: None   Collection Time: 10/18/18  6:13 PM   Specimen: Nasopharyngeal Swab; Respiratory  Result Value Ref Range Status   Adenovirus NOT DETECTED NOT DETECTED Final   Coronavirus 229E NOT DETECTED NOT DETECTED Final    Comment: (NOTE) The Coronavirus on the Respiratory Panel, DOES NOT test for the novel  Coronavirus (2019 nCoV)    Coronavirus HKU1 NOT DETECTED NOT DETECTED Final   Coronavirus NL63 NOT DETECTED NOT DETECTED Final   Coronavirus OC43 NOT DETECTED NOT DETECTED Final   Metapneumovirus NOT DETECTED NOT DETECTED Final   Rhinovirus / Enterovirus NOT DETECTED NOT DETECTED Final   Influenza A NOT DETECTED NOT DETECTED Final   Influenza B NOT DETECTED NOT DETECTED Final   Parainfluenza Virus 1 NOT DETECTED NOT DETECTED Final   Parainfluenza Virus 2 NOT DETECTED NOT DETECTED Final   Parainfluenza Virus 3 NOT DETECTED NOT DETECTED Final   Parainfluenza Virus 4 NOT DETECTED NOT DETECTED Final   Respiratory Syncytial Virus NOT DETECTED NOT DETECTED Final   Bordetella pertussis NOT DETECTED NOT DETECTED Final   Chlamydophila pneumoniae NOT DETECTED NOT DETECTED Final   Mycoplasma pneumoniae NOT DETECTED NOT DETECTED Final    Comment: Performed at [facility], [place], [place], [place]  [address]  MRSA PCR Screening     Status: None   Collection Time: 10/18/18  6:14 PM   Specimen: Nasopharyngeal  Result Value Ref Range Status   MRSA by PCR NEGATIVE NEGATIVE Final    Comment:        The GeneXpert MRSA Assay (FDA approved for NASAL specimens only), is one component of a comprehensive MRSA colonization surveillance program. It is not intended to diagnose MRSA infection nor to guide or monitor treatment for MRSA infections. Performed at [facility], [address] [place], [place] [address]     RADIOLOGY:  No results found.   Management plans discussed with the patient, family and they are in agreement.  CODE STATUS: DNR   TOTAL TIME TAKING CARE OF THIS PATIENT: 37 minutes.    Karim Aiello M.D on 10/21/2018 at 10:35 AM  Between 7am to 6pm - Pager - [phone]  After 6pm go to [email or web address] - Proofreader  [facility] [place] [facility]  Office  [phone]  CC: Primary care physician; [facility]   Note: This dictation was prepared with Dragon dictation along with smaller phrase technology. Any transcriptional errors that result from this process are unintentional.

## 2018-10-21 NOTE — Evaluation (Signed)
Occupational Therapy Evaluation Patient Details Name: Kenneth Summers MRN: [id number] DOB: 1945/10/28 Today's Date: 10/21/2018    History of Present Illness Kenneth Summers  is a 73 y.o. male with PMHx of coronary artery disease, BPH, history of antiphospholipid syndrome, hypertension, hyperlipidemia, IBS, cervical spinal stenosis, peripheral artery disease, rheumatoid arthritis, restless leg syndrome who presented to the hospital due to fever, cough.   Clinical Impression   Kenneth Summers was seen for OT evaluation this date. Prior to hospital admission, pt was using a power wheel chair for all mobility and dependent for transfers at baseline. Caregivers use a hoyer lift in the home.  Pt lives with his adult daughter and has a PCA who assists with ADLs.  Currently pt demonstrates impairments in balance, coordination, functional UE use requiring max assist for bed mobility and set-up to min assist for grooming and self-feeding at bed level.  Pt would benefit from skilled OT to address noted impairments and functional limitations (see below for any additional details) in order to maximize safety and independence while minimizing falls risk and caregiver burden. Upon hospital discharge, recommend pt continue with outpatient occupational therapy.      Follow Up Recommendations  Outpatient OT    Equipment Recommendations  None recommended by OT(PT has necessary equipment at this time.)    Recommendations for Other Services       Precautions / Restrictions Precautions Precautions: Fall Precaution Comments: High Fall Restrictions Weight Bearing Restrictions: No      Mobility Bed Mobility Overal bed mobility: Needs Assistance Bed Mobility: Rolling Rolling: Max assist            Transfers                 General transfer comment: Dependent at baseline. Uses hoyer lift.    Balance                                           ADL either performed or  assessed with clinical judgement   ADL                                         General ADL Comments: Pt generally near baseline, however reports some additional deficits such as inability to open meal tray items, and limited UE coordination which impacts his ability to complete self-care tasks. Pt is dependent for functional transfers at baseline using a hoyer lift.     Vision Baseline Vision/History: Wears glasses Wears Glasses: Reading only Patient Visual Report: No change from baseline       Perception     Praxis      Pertinent Vitals/Pain Pain Assessment: No/denies pain     Hand Dominance Right   Extremity/Trunk Assessment Upper Extremity Assessment Upper Extremity Assessment: Generalized weakness(Pt with decreased grip strength and decreased gross/FMC. Is working with OT in outpatient.)   Lower Extremity Assessment Lower Extremity Assessment: Generalized weakness;Defer to PT evaluation LLE Coordination: decreased gross motor       Communication Communication Communication: No difficulties   Cognition Arousal/Alertness: Awake/alert Behavior During Therapy: WFL for tasks assessed/performed Overall Cognitive Status: History of cognitive impairments - at baseline  General Comments: Pt required re-direction to conversation topic at times during session. Able to follow 1-step VC's.   General Comments  Pt with prevlon boot in place at start/end of session.    Exercises     Shoulder Instructions      Home Living Family/patient expects to be discharged to:: Private residence Living Arrangements: Children Available Help at Discharge: Family Type of Home: House                                  Prior Functioning/Environment Level of Independence: Needs assistance  Gait / Transfers Assistance Needed: Has hoyer lift and power WC. ADL's / Homemaking Assistance Needed: dependent for  ADLs/IADL. Has PCA.            OT Problem List: Decreased strength;Decreased coordination;Decreased range of motion;Decreased cognition;Impaired sensation;Decreased activity tolerance;Decreased safety awareness;Decreased knowledge of use of DME or AE;Impaired balance (sitting and/or standing);Impaired UE functional use      OT Treatment/Interventions:      OT Goals(Current goals can be found in the care plan section) Acute Rehab OT Goals Patient Stated Goal: To go home OT Goal Formulation: With patient Time For Goal Achievement: 11/04/18 Potential to Achieve Goals: Good  OT Frequency:     Barriers to D/C:            Co-evaluation              AM-PAC OT "6 Clicks" Daily Activity     Outcome Measure Help from another person eating meals?: A Little Help from another person taking care of personal grooming?: A Little Help from another person toileting, which includes using toliet, bedpan, or urinal?: Total Help from another person bathing (including washing, rinsing, drying)?: A Lot Help from another person to put on and taking off regular upper body clothing?: A Little Help from another person to put on and taking off regular lower body clothing?: Total 6 Click Score: 13   End of Session    Activity Tolerance: Patient tolerated treatment well Patient left: in bed;with call bell/phone within reach;with bed alarm set;Other (comment)(With prevlon boot in place.)  OT Visit Diagnosis: Other abnormalities of gait and mobility (R26.89);Muscle weakness (generalized) (M62.81)                Time: [id number] OT Time Calculation (min): 14 min Charges:  OT General Charges $OT Visit: 1 Visit OT Evaluation $OT Eval Moderate Complexity: 1 Mod  Shara Blazing, M.S., OTR/L Ascom: [phone] 10/21/18, 12:29 PM

## 2018-10-21 NOTE — TOC Initial Note (Signed)
Transition of Care [facility]) - Initial/Assessment Note    Patient Details  Name: Kenneth Summers MRN: [id number] Date of Birth: 10/24/1945  Transition of Care [facility]) CM/SW Contact:    Shelbie Hutching, RN Phone Number: 10/21/2018, 10:58 AM  Clinical Narrative:                 Patient admitted with Sepsis, resolved, history of stroke with residual left hemiplegia.   Patient is ready for discharge today and will discharge back home.  Patient is from home with his wife, bed and wheelchair bound at baseline.  Patient has privately hired caregivers at home and patient goes for outpatient PT 3 times per week transported by [employer].  Patient has a hospital bed, hoyer lift and electric wheelchair.  No equipment needs or HH needs as patient will be going to OP PT and OT.   Expected Discharge Plan: Home/Self Care Barriers to Discharge: Barriers Resolved   Patient Goals and CMS Choice        Expected Discharge Plan and Services Expected Discharge Plan: Home/Self Care   Discharge Planning Services: CM Consult   Living arrangements for the past 2 months: Single Family Home Expected Discharge Date: 10/21/18                                    Prior Living Arrangements/Services Living arrangements for the past 2 months: Single Family Home Lives with:: Spouse Patient language and need for interpreter reviewed:: No Do you feel safe going back to the place where you live?: Yes      Need for Family Participation in Patient Care: Yes (Comment)(stroke, wheelchair bound) Care giver support system in place?: Yes (comment)(wife and daughter) Current home services: Homehealth aide Criminal Activity/Legal Involvement Pertinent to Current Situation/Hospitalization: No - Comment as needed  Activities of Daily Living Home Assistive Devices/Equipment: Wheelchair, Hospital bed, [employer] Lift ADL Screening (condition at time of admission) [name] cognitive ability adequate to safely complete daily  activities?: No Is the patient deaf or have difficulty hearing?: No Does the patient have difficulty seeing, even when wearing glasses/contacts?: No Does the patient have difficulty concentrating, remembering, or making decisions?: No Patient able to express need for assistance with ADLs?: Yes Does the patient have difficulty dressing or bathing?: Yes Independently performs ADLs?: No Communication: Independent Dressing (OT): Dependent, Needs assistance Is this a change from baseline?: Pre-admission baseline Grooming: Needs assistance Is this a change from baseline?: Pre-admission baseline Feeding: Independent Bathing: Dependent, Needs assistance Is this a change from baseline?: Pre-admission baseline Toileting: Dependent, Needs assistance Is this a change from baseline?: Pre-admission baseline In/Out Bed: Dependent, Needs assistance Is this a change from baseline?: Pre-admission baseline Walks in Home: Dependent, Needs assistance Is this a change from baseline?: Pre-admission baseline Does the patient have difficulty walking or climbing stairs?: Yes Weakness of Legs: Left Weakness of Arms/Hands: None  Permission Sought/Granted Permission sought to share information with : Case Manager, Family Supports Permission granted to share information with : Yes, Verbal Permission Granted        Permission granted to share info w Relationship: daughter     Emotional Assessment Appearance:: Appears stated age Attitude/Demeanor/Rapport: Engaged Affect (typically observed): Accepting Orientation: : Oriented to Self, Oriented to Place, Oriented to  Time, Oriented to Situation Alcohol / Substance Use: Not Applicable Psych Involvement: No (comment)  Admission diagnosis:  Cough [R05] Upper respiratory tract infection, unspecified type [J06.9] Sepsis,  due to unspecified organism, unspecified whether acute organ dysfunction present [facility]) [A41.9] Patient Active Problem List   Diagnosis Date  Noted  . Sepsis ([place]) 10/18/2018   PCP:  [facility]:   [facility] [phone] [address], [address] [place] [address] Phone: [phone] Fax: [phone]  [place], [address]. [place] [address] Phone: [phone] Fax: [phone]     Social Determinants of Health (SDOH) Interventions    Readmission Risk Interventions No flowsheet data found.

## 2018-10-23 ENCOUNTER — Ambulatory Visit: Payer: Medicare Other | Admitting: Occupational Therapy

## 2018-10-23 ENCOUNTER — Other Ambulatory Visit: Payer: Self-pay

## 2018-10-23 ENCOUNTER — Ambulatory Visit: Payer: Medicare Other

## 2018-10-23 ENCOUNTER — Ambulatory Visit: Payer: Medicare Other | Admitting: Physical Therapy

## 2018-10-23 ENCOUNTER — Encounter: Payer: Self-pay | Admitting: Occupational Therapy

## 2018-10-23 ENCOUNTER — Encounter: Payer: Medicare Other | Admitting: Occupational Therapy

## 2018-10-23 VITALS — BP 102/58 | HR 82 | Temp 97.9°F

## 2018-10-23 DIAGNOSIS — R278 Other lack of coordination: Secondary | ICD-10-CM

## 2018-10-23 DIAGNOSIS — R262 Difficulty in walking, not elsewhere classified: Secondary | ICD-10-CM

## 2018-10-23 DIAGNOSIS — M6281 Muscle weakness (generalized): Secondary | ICD-10-CM

## 2018-10-23 DIAGNOSIS — H547 Unspecified visual loss: Secondary | ICD-10-CM | POA: Diagnosis present

## 2018-10-23 DIAGNOSIS — R41841 Cognitive communication deficit: Secondary | ICD-10-CM | POA: Diagnosis present

## 2018-10-23 DIAGNOSIS — R296 Repeated falls: Secondary | ICD-10-CM | POA: Diagnosis present

## 2018-10-23 LAB — CULTURE, BLOOD (ROUTINE X 2)
Culture: NO GROWTH
Culture: NO GROWTH

## 2018-10-23 NOTE — Therapy (Addendum)
[place] MAIN [facility] SERVICES [address] [place], [place], [address] Phone: [phone]   Fax:  [phone]  Occupational Therapy Revaluation Note  Patient Details  Name: Kenneth Summers MRN: [id number] Date of Birth: 1945/04/13 No data recorded  Encounter Date: 10/23/2018  OT End of Session - 10/23/18 1706    Visit Number  71    Number of Visits  29   Date for OT Re-Evaluation  01/15/19    Authorization Type  Progress report period starting 09/26/2018    OT Start Time  1350    OT Stop Time  1430    OT Time Calculation (min)  40 min    Activity Tolerance  Patient tolerated treatment well    Behavior During Therapy  [facility] for tasks assessed/performed       Past Medical History:  Diagnosis Date  . Alcohol abuse   . APS (antiphospholipid syndrome) ([place])   . BPH (benign prostatic hyperplasia)   . CAD (coronary artery disease)   . Cellulitis   . Cervical spinal stenosis   . Chronic osteomyelitis ([place])   . CKD (chronic kidney disease)   . CKD (chronic kidney disease)   . Clostridium difficile diarrhea   . Depression   . Diplopia   . Fatigue   . GERD (gastroesophageal reflux disease)   . Hyperlipemia   . Hypertension   . Hypovitaminosis D   . IBS (irritable bowel syndrome)   . 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 Insomnia   . Left lumbosacral radiculopathy   . Moderate obstructive sleep apnea   . Osteomyelitis of foot ([place])   . PAD (peripheral artery disease) ([place])   . Pruritus   . RA (rheumatoid arthritis) ([place])   . Radiculopathy   . Restless legs syndrome   . RLS (restless legs syndrome)   . SLE (systemic lupus erythematosus related syndrome) ([place])   . Stroke ([place])   . Toxic maculopathy of both eyes     Past Surgical History:  Procedure Laterality Date  . CARDIAC CATHETERIZATION    . CAROTID ENDARTERECTOMY    . CERVICAL LAMINECTOMY    . CORONARY ANGIOPLASTY    . FRACTURE SURGERY    . fusion C5-6-7    . HERNIA REPAIR    .  KNEE ARTHROSCOPY    . TONSILLECTOMY      There were no vitals filed for this visit.  Subjective Assessment - 10/23/18 1705    Subjective   Pt. presents with a cough today.    Patient is accompanied by:  Family member    Pertinent History  Pt. is a 73 y.o. 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 Pt. PMHx includes: Multiple Falls, Lupus, DJD, Renal Abscess, CVA. Pt. resides with his wife. Pt.'s wife and daughter assist with ADLs. Pt. has caregivers in  for 2 hours a day, 6 days a week. Pt. received Rehab services in acute care, at SNF for STR, and [place] services. Pt. is retired from [employer] for [employer] and [employer].    Currently in Pain?  No/denies         [facility] OT Assessment - 10/23/18 1723      Coordination   Right 9 Hole Peg Test  1 min. & 9  sec.    Left 9 Hole Peg Test  59min. & 23 sec.      AROM   Overall AROM Comments  Left shoulder flexion 84 ,  abduction 82, elbow 10-142, wrist extension 64      Hand Function   Right Hand Grip (lbs)  30    Right Hand Lateral Pinch  10 lbs    Right Hand 3 Point Pinch  7 lbs    Left Hand Grip (lbs)  20    Left Hand Lateral Pinch  8 lbs    Left 3 point pinch  6 lbs      Measurements were obtained, and goals were reviewed with the pt.                 OT Education - 10/23/18 1706    Education Details  LUE functioning, goals    Person(s) Educated  Patient    Methods  Explanation;Demonstration    Comprehension  Verbalized understanding;Returned demonstration;Verbal cues required          OT Long Term Goals - 10/23/18 1730      OT LONG TERM GOAL #1   Title  Pt. will increase left shoulder flexion AROM by 10 degrees to access cabinet/shelf.    Baseline  Re-eval: pt. has difficulty reaching for items with the LUE secondary to decreased LUE ROM    Time  12    Period  Weeks    Status  On-going    Target Date  01/15/19      OT LONG TERM GOAL #2   Title  Pt. will donn a  shirt with Supervision.    Baseline  Re-eval: Min/ModA    Time  12    Period  Weeks    Status  On-going    Target Date  01/15/19      OT LONG TERM GOAL #3   Title  Pt. will require ModA to perform LE dressing    Baseline  MaxA left, ModA with the right    Time  12    Period  Weeks    Status  On-going    Target Date  01/15/19      OT LONG TERM GOAL #4   Title  Pt. will improve left grip strength by 10# to be able to open a jar/container.    Baseline  Re-Evaluation: Decreased grip since hospital readmission, pt. has difficulty opening containers    Time  12    Period  Weeks    Status  On-going    Target Date  01/15/19      OT LONG TERM GOAL #5   Title  Pt. will improve left hand [facility] skills to be able to assit with buttoning/zipping.    Baseline  Re-eval: Pt. has difficulty [facility] skills buttoning/zipping    Time  12    Period  Weeks    Status  New    Target Date  01/15/19      OT LONG TERM GOAL #6   Title  Pt. will demonstrate visual conmpensatory strategies during 100% of the time during ADLs    Baseline  Re-eval: Pt. requires cues for left sides awareness.    Time  12    Period  Weeks    Status  On-going    Target Date  01/15/19      OT LONG TERM GOAL #7   Title  Pt. will prepare a simple cold snack from w/c with supervision using cognitive compensatory strategies 100% of the time.    Baseline  Re-eval: Pt. is unable    Time  12    Period  Weeks    Status  On-going  Target Date  11/27/18      OT LONG TERM GOAL #8   Title  Pt. will accurately identify potential safety hazard using good safety awareness, and judgement 100% for ADLs, and IADLs.    Baseline  Re-eval: limited    Time  12    Period  Weeks    Status  On-going      OT LONG TERM GOAL  #9   TITLE  Pt. will  navigate his w/c around obstacles with Supervision and 100% accuracy    Baseline  Re-eval: Pt. requires cuing for obstacle on the left    Time  12    Period  Weeks    Status  On-going    Target  Date  11/27/18            Plan - 10/23/18 1708    Clinical Impression Statement  Pt. was hospitalized over the weekend with a Fever,Sepsis, and an ankle wound with cellulitis.. Pt. returns to the clinic with an active cough. Pt. re-evaluation was completed, with goals reviewed. Pt. presents with increased weakness with bilateral grip strength, decreased left shoulder ROM, and decreased left hand [facility] skills. Pt. has progessed with left pinch strength, left sided awareness, and visual attention. Pt. continues to require verbal, and tactile cues to engage the LUE. Pt. is improving with UE dressing skills, and continues to require assist from cargeivers for LE dressing skills using A/E. Pt. continues to work on improving UE functioning, cognitive functioning, visual , and cognitive compensatory strategies to maximize safety with ADLS, and decrease caregiver burden.    OT Occupational Profile and History  Problem Focused Assessment - Including review of records relating to presenting problem    Occupational performance deficits (Please refer to evaluation for details):  ADL's    Body Structure / Function / Physical Skills  UE functional use;Coordination;FMC;Dexterity;Strength;ROM    Cognitive Skills  Attention;Memory;Emotional;Problem Solve;Safety Awareness    Rehab Potential  Good    Clinical Decision Making  Several treatment options, min-mod task modification necessary    Comorbidities Affecting Occupational Performance:  Presence of comorbidities impacting occupational performance    Comorbidities impacting occupational performance description:  Phyical, cognitive, visual,  medical comorbidities    Modification or Assistance to Complete Evaluation   No modification of tasks or assist necessary to complete eval    OT Frequency  3x / week    OT Duration  12 weeks    OT Treatment/Interventions  Self-care/ADL training;DME and/or AE instruction;Therapeutic exercise;Therapeutic activities;Moist  Heat;Cognitive remediation/compensation;Neuromuscular education;Visual/perceptual remediation/compensation;Coping strategies training;Patient/family education;Passive range of motion;Psychosocial skills training;Energy conservation;Functional Mobility Training    Consulted and Agree with Plan of Care  Patient    Family Member Consulted  Daughter [name]       Patient will benefit from skilled therapeutic intervention in order to improve the following deficits and impairments:   Body Structure / Function / Physical Skills: UE functional use, Coordination, FMC, Dexterity, Strength, ROM Cognitive Skills: Attention, Memory, Emotional, Problem Solve, Safety Awareness     Visit Diagnosis: Muscle weakness (generalized) - Plan: Ot plan of care cert/re-cert  Other lack of coordination - Plan: Ot plan of care cert/re-cert    Problem List Patient Active Problem List   Diagnosis Date Noted  . Sepsis ([place]) 10/18/2018    Harrel Carina, MS, OTR/L 10/23/2018, 5:40 PM  [place] MAIN [facility] SERVICES [address] [place], [place], [address] Phone: [phone]   Fax:  [phone]  Name: Kenneth Leedom  Summers MRN: [id number] Date of Birth: 29-Jan-1946

## 2018-10-23 NOTE — Therapy (Signed)
[place] MAIN [facility] SERVICES [address] [place], [place], [address] Phone: [phone]   Fax:  [phone]  Physical Therapy Progress Note   Dates of reporting period  09/24/2018   to   10/23/2018   Patient Details  Name: Kenneth Summers MRN: [id number] Date of Birth: 04-22-1945 Referring Provider (PT): Dr. [name], Guy Begin   Encounter Date: 10/23/2018  PT End of Session - 10/23/18 1314    Visit Number  50    Number of Visits  67    Date for PT Re-Evaluation  12/04/18    Authorization Type  Medicare reporting period starting 09/11/18    Authorization Time Period  Current cert period 02/06/6577-03/83/33    PT Start Time  1300    PT Stop Time  1345    PT Time Calculation (min)  45 min    Equipment Utilized During Treatment  Gait belt    Activity Tolerance  Patient limited by fatigue    Behavior During Therapy  [facility] for tasks assessed/performed       Past Medical History:  Diagnosis Date  . Alcohol abuse   . APS (antiphospholipid syndrome) ([place])   . BPH (benign prostatic hyperplasia)   . CAD (coronary artery disease)   . Cellulitis   . Cervical spinal stenosis   . Chronic osteomyelitis ([place])   . CKD (chronic kidney disease)   . CKD (chronic kidney disease)   . Clostridium difficile diarrhea   . Depression   . Diplopia   . Fatigue   . GERD (gastroesophageal reflux disease)   . Hyperlipemia   . Hypertension   . Hypovitaminosis D   . IBS (irritable bowel syndrome)   . 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 Insomnia   . Left lumbosacral radiculopathy   . Moderate obstructive sleep apnea   . Osteomyelitis of foot ([place])   . PAD (peripheral artery disease) ([place])   . Pruritus   . RA (rheumatoid arthritis) ([place])   . Radiculopathy   . Restless legs syndrome   . RLS (restless legs syndrome)   . SLE (systemic lupus erythematosus related syndrome) ([place])   . Stroke ([place])   . Toxic maculopathy of both eyes     Past Surgical History:   Procedure Laterality Date  . CARDIAC CATHETERIZATION    . CAROTID ENDARTERECTOMY    . CERVICAL LAMINECTOMY    . CORONARY ANGIOPLASTY    . FRACTURE SURGERY    . fusion C5-6-7    . HERNIA REPAIR    . KNEE ARTHROSCOPY    . TONSILLECTOMY      Vitals:   10/23/18 1311  BP: (!) 102/58  Pulse: 82  Temp: 97.9 F (36.6 C)  SpO2: 95%    Subjective Assessment - 10/23/18 1307    Subjective  Pt is back today after being in acute care for 3 days.  He states that he still has a cough, but that it is productive now.  He states that he has not had a fever since Sunday night.  COVID testing negative during admission. Otherwise, he feels okay today.  No new questions or concerns today.    Pertinent History  Patient is a 73 y.o. male who presents to outpatient physical therapy with a referral for medical diagnosis of CVA. This patient's chief complaints consist of left hemiplegia and overall deconditioning leading to the following functional deficits: dependent for ADLs, IADLs, unable to transfer to car, dependent transfers at home with hoyer lift,  unable to walk or stand without significant assistance, difficulty with W/C navigation..    Limitations  Sitting;Lifting;Standing;House hold activities;Writing;Walking;Other (comment)    Diagnostic tests  Brain MRI 11/01/2017: IMPRESSION: 1. Acute right ACA territory infarct as demonstrated on prior CT imaging. No intracranial hemorrhage or significant mass effect. 2. 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    Currently in Pain?  No/denies           TREATMENT:       Reassessment of goals  Transferred wheelchair<>mat table mod A +2; patient required increased cues for proper positioning and increased time to prepare to transfer;      Sitting edge of mat pt demonstrated an improvement in his posture today, sitting more upright without a L lateral lean today.     Ball roll outs while sitting edge of mat x5 forward,  and x5 laterally to each side; CGA provided throughout behind pt   While sitting edge of mat, pt cued to reach to various, randomized spots provided by therapist encouraging him to reach forward, laterally, and up to promote forward and lateral weight shifting while maintaining core stability.  CGA to minA provided throughout to prevent LOB.   Transitioned to supine, min A +2 with assistance for lifting LLE and cues for proper positioning;     Hooklying marching performed bilaterally x10 on each side.  Assistance required on L side to raise leg   Hooklying LAQ x 10 bilateral;      Supine bridging with bolster under knees x10   Transferred sit<>stand with RW mod A +2 with mod VCs for proper hand placement; required cueing to stand erect with coming into more hip extension, but displays difficulty getting into position. Requires modA x2 to avoid posterior weight shift today with RW.  Able to complete 3 stands today for <30sec each, displaying more fatigue today.     Transferred mat table to wheelchair mod A +2 with increased time required and cues for weight shift for proper positioning;          Pt educated throughout session about proper posture and technique with exercises. Improved exercise technique, movement at target joints, use of target muscles after min to mod verbal, visual, tactile cues.    Pt continues to display good motivation throughout PT sessions. He has met 1short term goaland 1 long term goalto date.  His daughter reports that he has made improvements with his bed mobility, stating that he does about 65% of the work with rolling using the bed rails if he is in an optimal position however he still requires a hoyer lift to sit edge of bed.  In PT sessions, he is able to complete a STS from an elevated mat table with a rolling walker with modA+2, and additionally completes a squat pivot transfer from a power WC to the mat table with modA+2.  He is unable to safely ambulate  with a RW at time time. He is also unable to safely perform stairs at this time.  He has made improvements in being able to anteriorly weight shift, demonstrating the ability to reach 12-13" in sitting while maintaining his balance.  His L lateral trunk lean is much improved, demonstrating the ability to maintain sitting balance with CGA and occasional minA when fatigued.  Pt will benefit from continued skilled PT services in order to increase strength and functional mobilityin order to decrease caregiver burden, improve his ability to perform ADLs, and to improve  his QOL.        PT Short Term Goals - 10/23/18 1314      PT SHORT TERM GOAL #1   Title  Be independent with initial home exercise program for self-management of symptoms.    Baseline  HEP to be given at second session; advice to practice unsupported sitting at home (06/19/2018); 07/08/18: Performing at home, 6/29: needs assistance but is doing exercise program regularly; 08/22/18: adherent;    Time  2    Period  Weeks    Status  Achieved    Target Date  06/26/18        PT Long Term Goals - 10/23/18 1315      PT LONG TERM GOAL #1   Title  Patient will complete all bed mobility with min A to improve functional independence for getting in and out of bed and adjusting in bed.     Baseline  max A - total A reported by family (06/19/2018); 07/08/18: still requires maxA, dtr reports improvement in rolling since starting therapy; 6/23: min A to supervision in clinic; not doing at home right; 8/5: Pt's daughter states that his rolling has improved, but still has difficulty with all other bed mobility; 10/23/18: pts daughter states that she is doing about 35% of the work if he is in proper position, he does uses the bed rails to help roll;  using Hoyer for coming to sit EOB    Time  12    Period  Weeks    Status  Partially Met    Target Date  12/04/18      PT LONG TERM GOAL #2   Title  Patient will complete sit <> stand transfer chair to  chair with Min A and LRAD to improve functional independence for household and community mobility.     Baseline  max A +2 STS at W/C (06/12/2018); max A +2-3 STS or stand pivot transfer, able to complete more reps (06/19/2018); 07/08/18: not safe to attempt at this time; 09/11/18: requires maxA+1 or modA+2 for STSs and squat pivot transfer; 10/23/18: requires maxA+1 or modA+2 for STSs and squat pivot transfer    Time  12    Period  Weeks    Status  On-going    Target Date  12/04/18      PT LONG TERM GOAL #3   Title  Patient will navigate power w/c with min A x 100 feet to improve mobility for household and short community distances.    Baseline  required total A (06/12/2018); able to roll 20 feet with min A (06/20/2018); 07/08/18: Pt can go approximately 10' without assist per family;  10/23/2018: pt's daughter states that his power WC mobility has become 100% better with the new hand control, slowed speed, and with practice    Time  12    Period  Weeks    Status  Achieved    Target Date  10/23/18      PT LONG TERM GOAL #4   Title  Patient will complete car transfers W/C to family SUV with min A using LRAD to improve his abilty to participate in community activities.     Baseline  unable to complete car transfers at all (06/12/2018); unable to complete car transfers at all (06/19/2018).; 07/08/18: Unable to perform at this time; 09/11/18: unable to perform at this time; 10/23/2018: unable to complete car transfers at all    Time  12    Period  Weeks  Status  On-going    Target Date  12/04/18      PT LONG TERM GOAL #5   Title  Patient will ambulate at least 150 feet with mod I using LRAD to improve mobility for household and community distances.     Baseline  unable to take steps (06/12/2018); able to shift R leg forward and back with max A and RW at edge of plinth (06/19/2018); 07/08/18: unable to ambulate at this time. 09/11/18: unable to perform at this time; 10/23/2018: unable to perform at this time    Time  12     Period  Weeks    Status  On-going    Target Date  12/04/18      PT LONG TERM GOAL #6   Title  Patient will navigate 4 steps with min A and BUE support to improve his ability to access community and social participation.     Baseline  unable to complet stairs (06/12/2018); unable to complete stairs (06/19/2018); 07/08/18: unable to attempt at this time; 09/11/18: unsafe to attempt at this time; 10/23/2018: unable to complet stairs (06/12/2018); unable to complete stairs (06/19/2018); 07/08/18: unable to attempt at this time; 09/11/18: unsafe to attempt at this time; 10/23/18: unsafe to attempt at this time    Time  12    Period  Weeks    Status  On-going    Target Date  12/04/18      PT LONG TERM GOAL #7   Title  Patient will increase functional reach test to >15 inches in sitting to exhibit improved sitting balance and positioning and reduce fall risk;    Baseline  07/30/18: 12 inches; 09/11/18: 10-12 inches; 10/23/18: 12-13 inch    Time  4    Period  Weeks    Status  Partially Met    Target Date  12/04/18            Plan - 10/23/18 1424    Clinical Impression Statement  Pt continues to display good motivation throughout PT sessions.  He has met 1 short term goal and 1 long term goal to date.  His daughter reports that he has made improvements with his bed mobility, stating that he does about 65% of the work with rolling using the bed rails if he is in an optimal position however he still requires a hoyer lift to sit edge of bed.  In PT sessions, he is able to complete a STS from an elevated mat table with a rolling walker with modA+2, and additionally completes a squat pivot transfer from a power WC to the mat table with modA+2.  He is unable to safely ambulate with a RW at time time.  He is also unable to safely perform stairs at this time.  He has made improvements in being able to anteriorly weight shift, demonstrating the ability to reach 12-13" in sitting while maintaining his balance.  His L  lateral trunk lean is much improved, demonstrating the ability to maintain sitting balance with CGA and occasional minA when fatigued.  Pt will benefit from continued skilled PT services in order to increase strength and functional mobility in order to decrease caregiver burden, improve his ability to perform ADLs, and to improve his QOL.    Personal Factors and Comorbidities  Age;Comorbidity 3+    Comorbidities  Relevant past medical history and comorbidities include long term steroid use for lupus, CKD, chronic osteomyelitis, cervical spine stenosis, BPH, APS, alcohol abuse, peripheral artery disease, depression, GERD,  obstructive sleep apnea, HTN, IBS, IgA deficiency, lumbar radiculopathy, RA, Stroke, toxic maculopathy in both eyes, systemic lupus erythematosus related syndrome, cardiac catheterization, carotid endarterectomy, cervical laminectomy, coronary angioplasty, Fusion C5-C7, knee arthroplasty, lumbar surgery.    Examination-Activity Limitations  Bed Mobility;Bend;Sit;Toileting;Stand;Stairs;Lift;Transfers;Squat;Locomotion Level;Carry;Dressing;Hygiene/Grooming;Continence    Examination-Participation Restrictions  Yard Work;Interpersonal Relationship;Community Activity    Stability/Clinical Decision Making  Evolving/Moderate complexity    Rehab Potential  Fair    PT Frequency  3x / week    PT Duration  12 weeks    PT Treatment/Interventions  ADLs/Self Care Home Management;Electrical Stimulation;Moist Heat;Cryotherapy;Gait training;Stair training;Functional mobility training;Therapeutic exercise;Balance training;Neuromuscular re-education;Cognitive remediation;Patient/family education;Orthotic Fit/Training;Wheelchair mobility training;Manual techniques;Passive range of motion;Energy conservation;Joint Manipulations    PT Next Visit Plan  Outcome measures, goals, progress note; Work on strength, sitting/standing balance, functional activities such as rolling, bed mobility, transfers    PT Home  Exercise Plan  handout provided via Beth during previous sessions    Consulted and Agree with Plan of Care  Patient;Family member/caregiver    Family Member Consulted  daughter [name]       Patient will benefit from skilled therapeutic intervention in order to improve the following deficits and impairments:  Abnormal gait, Decreased activity tolerance, Decreased cognition, Decreased endurance, Decreased knowledge of use of DME, Decreased range of motion, Decreased skin integrity, Decreased strength, Impaired perceived functional ability, Impaired sensation, Impaired UE functional use, Improper body mechanics, Pain, Cardiopulmonary status limiting activity, Decreased balance, Decreased coordination, Decreased mobility, Difficulty walking, Impaired tone, Postural dysfunction  Visit Diagnosis: Muscle weakness (generalized)  Difficulty in walking, not elsewhere classified  Other lack of coordination     Problem List Patient Active Problem List   Diagnosis Date Noted  . Sepsis ([place]) 10/18/2018   This entire session was performed under direct supervision and direction of a licensed therapist/therapist assistant . I have personally read, edited and approve of the note as written.   Lutricia Horsfall, SPT Phillips Grout PT, DPT, GCS  Huprich,Jason 10/23/2018, 4:53 PM  [place] MAIN [facility] SERVICES [address] [place], [place], [address] Phone: [phone]   Fax:  [phone]  Name: PONCE SKILLMAN MRN: [id number] Date of Birth: 01-07-1946

## 2018-10-24 ENCOUNTER — Other Ambulatory Visit: Payer: Self-pay

## 2018-10-24 ENCOUNTER — Encounter: Payer: Medicare Other | Admitting: Occupational Therapy

## 2018-10-24 ENCOUNTER — Ambulatory Visit: Payer: Medicare Other | Admitting: Occupational Therapy

## 2018-10-24 ENCOUNTER — Ambulatory Visit: Payer: Medicare Other

## 2018-10-24 VITALS — BP 112/83 | HR 83 | Temp 98.7°F

## 2018-10-24 DIAGNOSIS — M6281 Muscle weakness (generalized): Secondary | ICD-10-CM | POA: Diagnosis not present

## 2018-10-24 DIAGNOSIS — R262 Difficulty in walking, not elsewhere classified: Secondary | ICD-10-CM

## 2018-10-24 DIAGNOSIS — R278 Other lack of coordination: Secondary | ICD-10-CM

## 2018-10-24 NOTE — Therapy (Signed)
[place] MAIN [facility] SERVICES [address] [place], [place], [address] Phone: [phone]   Fax:  [phone]  Patient Details  Name: TYWAUN TROUP MRN: [id number] Date of Birth: [birth year] Referring Provider:  Valera Castle, *  Encounter Date: 10/24/2018  Pt. arrived for rehab services, however was too exhausted to stay for the Occupational Therapy session following PT today.  Pt. continues to have a significant cough, as he continues to recover from being hospitalized with sepsis over the weekend.  Harrel Carina, MS, OTR/L 10/24/2018, 3:33 PM  [place] MAIN [facility] SERVICES [address] [place], [place], [address] Phone: [phone]   Fax:  [phone]

## 2018-10-24 NOTE — Therapy (Signed)
[place] MAIN [facility] SERVICES [address] [place], [place], [address] Phone: [phone]   Fax:  [phone]  Physical Therapy Treatment  Patient Details  Name: Kenneth Summers MRN: [id number] Date of Birth: [birth year] Referring Provider (PT): Dr. [name], Guy Begin   Encounter Date: 10/24/2018  PT End of Session - 10/24/18 1254    Visit Number  51    Number of Visits  6    Date for PT Re-Evaluation  12/04/18    Authorization Type  Medicare reporting period starting 09/11/18    Authorization Time Period  Current cert period 4/0/1027-25/36/64    PT Start Time  1300    PT Stop Time  1345    PT Time Calculation (min)  45 min    Equipment Utilized During Treatment  Gait belt    Activity Tolerance  Patient limited by fatigue    Behavior During Therapy  [facility] for tasks assessed/performed       Past Medical History:  Diagnosis Date  . Alcohol abuse   . APS (antiphospholipid syndrome) ([place])   . BPH (benign prostatic hyperplasia)   . CAD (coronary artery disease)   . Cellulitis   . Cervical spinal stenosis   . Chronic osteomyelitis ([place])   . CKD (chronic kidney disease)   . CKD (chronic kidney disease)   . Clostridium difficile diarrhea   . Depression   . Diplopia   . Fatigue   . GERD (gastroesophageal reflux disease)   . Hyperlipemia   . Hypertension   . Hypovitaminosis D   . IBS (irritable bowel syndrome)   . 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 Insomnia   . Left lumbosacral radiculopathy   . Moderate obstructive sleep apnea   . Osteomyelitis of foot ([place])   . PAD (peripheral artery disease) ([place])   . Pruritus   . RA (rheumatoid arthritis) ([place])   . Radiculopathy   . Restless legs syndrome   . RLS (restless legs syndrome)   . SLE (systemic lupus erythematosus related syndrome) ([place])   . Stroke ([place])   . Toxic maculopathy of both eyes     Past Surgical History:  Procedure Laterality Date  . CARDIAC CATHETERIZATION    . CAROTID  ENDARTERECTOMY    . CERVICAL LAMINECTOMY    . CORONARY ANGIOPLASTY    . FRACTURE SURGERY    . fusion C5-6-7    . HERNIA REPAIR    . KNEE ARTHROSCOPY    . TONSILLECTOMY      Vitals:   10/24/18 1333  BP: 112/83  Pulse: 83  Temp: 98.7 F (37.1 C)  SpO2: 99%    Subjective Assessment - 10/24/18 1305    Subjective  Pt states that he is very fatigued today.  His daughter states that he takes the last dose of amoxicillin tomorrow.  No new questions or concerns today.    Pertinent History  Patient is a 73 y.o. male who presents to outpatient physical therapy with a referral for medical diagnosis of CVA. This patient's chief complaints consist of left hemiplegia and overall deconditioning leading to the following functional deficits: dependent for ADLs, IADLs, unable to transfer to car, dependent transfers at home with hoyer lift, unable to walk or stand without significant assistance, difficulty with W/C navigation..    Limitations  Sitting;Lifting;Standing;House hold activities;Writing;Walking;Other (comment)    Diagnostic tests  Brain MRI 11/01/2017: IMPRESSION: 1. Acute right ACA territory infarct as demonstrated on prior CT imaging. No intracranial hemorrhage or  significant mass effect. 2. 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    Currently in Pain?  No/denies         TREATMENT:      Transferred wheelchair<>mat table mod A +2; patient required increased cues for proper positioning and increased time to prepare to transfer;      Sitting edge of mat pt demonstrated an improvement in his posture, displaying an upright posture with visual feedback.  CGA provided throughout.   Anterior pelvic tilts performed while sitting edge of mat x10.  Moderate verbal cueing provided throughout for technique and to stay on task.        Seated punches to various points to promote reaching outside base of support.  x15  Seated knee drives into boxing pads x2 on  RLE.  Unable to complete reps on LLE.    Ball roll outs while sitting edge of mat x5 forward, and x5 laterally to each side; CGA provided throughout behind pt   Transitioned to supine with a wedge to elevate his head, min A +2 with assistance for lifting LLE and cues for proper positioning;  Pt had difficulty maintaining position due to coughing attacks.     Transferred sit<>stand with RW mod A +2 with mod VCs for proper hand placement; required cueing to stand erect with coming into more hip extension, but displays difficulty getting into position. Requires modA x2 to avoid posterior weight shift today with RW.  Able to complete 1 stand today for 5 seconds, displaying significant fatigue today.     Transferred mat table to wheelchair mod A +2 with increased time required and cues for weight shift for proper positioning;          Pt educated throughout session about proper posture and technique with exercises. Improved exercise technique, movement at target joints, use of target muscles after min to mod verbal, visual, tactile cues.    Patient presents with excessive fatigue today, limiting his ability to fully participate in PT today.  He required moderate cueing for to perform anterior pelvic tilts in sitting for proper technique and to remain focused on the task.  He responded well to the addition of seated punches to various points, enocouraging reaching outside his base of support and requiring him to increase his power to punch the boxing pad.  He was limited to performing 1 stand for 5 seconds today due to fatigue. Session limited today and pt/dtr encouraged to watch for signs/symtoms such as worsening cough, chills, fever, sweats, or other signs of infection. Daughter encouraged to contact PCP if she has concerns. Patient would benefit from additional skilled PT Intervention to improve strength, balance and mobility.          PT Short Term Goals - 10/23/18 1314      PT SHORT  TERM GOAL #1   Title  Be independent with initial home exercise program for self-management of symptoms.    Baseline  HEP to be given at second session; advice to practice unsupported sitting at home (06/19/2018); 07/08/18: Performing at home, 6/29: needs assistance but is doing exercise program regularly; 08/22/18: adherent;    Time  2    Period  Weeks    Status  Achieved    Target Date  06/26/18        PT Long Term Goals - 10/23/18 1315      PT LONG TERM GOAL #1   Title  Patient will complete all bed mobility with  min A to improve functional independence for getting in and out of bed and adjusting in bed.     Baseline  max A - total A reported by family (06/19/2018); 07/08/18: still requires maxA, dtr reports improvement in rolling since starting therapy; 6/23: min A to supervision in clinic; not doing at home right; 8/5: Pt's daughter states that his rolling has improved, but still has difficulty with all other bed mobility; 10/23/18: pts daughter states that she is doing about 35% of the work if he is in proper position, he does uses the bed rails to help roll;  using Hoyer for coming to sit EOB    Time  12    Period  Weeks    Status  Partially Met    Target Date  12/04/18      PT LONG TERM GOAL #2   Title  Patient will complete sit <> stand transfer chair to chair with Min A and LRAD to improve functional independence for household and community mobility.     Baseline  max A +2 STS at W/C (06/12/2018); max A +2-3 STS or stand pivot transfer, able to complete more reps (06/19/2018); 07/08/18: not safe to attempt at this time; 09/11/18: requires maxA+1 or modA+2 for STSs and squat pivot transfer; 10/23/18: requires maxA+1 or modA+2 for STSs and squat pivot transfer    Time  12    Period  Weeks    Status  On-going    Target Date  12/04/18      PT LONG TERM GOAL #3   Title  Patient will navigate power w/c with min A x 100 feet to improve mobility for household and short community distances.     Baseline  required total A (06/12/2018); able to roll 20 feet with min A (06/20/2018); 07/08/18: Pt can go approximately 10' without assist per family;  10/23/2018: pt's daughter states that his power WC mobility has become 100% better with the new hand control, slowed speed, and with practice    Time  12    Period  Weeks    Status  Achieved    Target Date  10/23/18      PT LONG TERM GOAL #4   Title  Patient will complete car transfers W/C to family SUV with min A using LRAD to improve his abilty to participate in community activities.     Baseline  unable to complete car transfers at all (06/12/2018); unable to complete car transfers at all (06/19/2018).; 07/08/18: Unable to perform at this time; 09/11/18: unable to perform at this time; 10/23/2018: unable to complete car transfers at all    Time  12    Period  Weeks    Status  On-going    Target Date  12/04/18      PT LONG TERM GOAL #5   Title  Patient will ambulate at least 150 feet with mod I using LRAD to improve mobility for household and community distances.     Baseline  unable to take steps (06/12/2018); able to shift R leg forward and back with max A and RW at edge of plinth (06/19/2018); 07/08/18: unable to ambulate at this time. 09/11/18: unable to perform at this time; 10/23/2018: unable to perform at this time    Time  12    Period  Weeks    Status  On-going    Target Date  12/04/18      PT LONG TERM GOAL #6   Title  Patient will navigate  4 steps with min A and BUE support to improve his ability to access community and social participation.     Baseline  unable to complet stairs (06/12/2018); unable to complete stairs (06/19/2018); 07/08/18: unable to attempt at this time; 09/11/18: unsafe to attempt at this time; 10/23/2018: unable to complet stairs (06/12/2018); unable to complete stairs (06/19/2018); 07/08/18: unable to attempt at this time; 09/11/18: unsafe to attempt at this time; 10/23/18: unsafe to attempt at this time    Time  12    Period  Weeks     Status  On-going    Target Date  12/04/18      PT LONG TERM GOAL #7   Title  Patient will increase functional reach test to >15 inches in sitting to exhibit improved sitting balance and positioning and reduce fall risk;    Baseline  07/30/18: 12 inches; 09/11/18: 10-12 inches; 10/23/18: 12-13 inch    Time  4    Period  Weeks    Status  Partially Met    Target Date  12/04/18            Plan - 10/24/18 1531    Clinical Impression Statement  Patient presents with excessive fatigue today, limiting his ability to fully participate in PT today.  He required moderate cueing for to perform anterior pelvic tilts in sitting for proper technique and to remain focused on the task.  He responded well to the addition of seated punches to various points, enocouraging reaching outside his base of support and requiring him to increase his power to punch the boxing pad.  He was limited to performing 1 stand for 5 seconds today due to fatigue. Session limited today and pt/dtr encouraged to watch for signs/symtoms such as worsening cough, chills, fever, sweats, or other signs of infection. Daughter encouraged to contact PCP if she has concerns. Patient would benefit from additional skilled PT Intervention to improve strength, balance and mobility.    Personal Factors and Comorbidities  Age;Comorbidity 3+    Comorbidities  Relevant past medical history and comorbidities include long term steroid use for lupus, CKD, chronic osteomyelitis, cervical spine stenosis, BPH, APS, alcohol abuse, peripheral artery disease, depression, GERD, obstructive sleep apnea, HTN, IBS, IgA deficiency, lumbar radiculopathy, RA, Stroke, toxic maculopathy in both eyes, systemic lupus erythematosus related syndrome, cardiac catheterization, carotid endarterectomy, cervical laminectomy, coronary angioplasty, Fusion C5-C7, knee arthroplasty, lumbar surgery.    Examination-Activity Limitations  Bed  Mobility;Bend;Sit;Toileting;Stand;Stairs;Lift;Transfers;Squat;Locomotion Level;Carry;Dressing;Hygiene/Grooming;Continence    Examination-Participation Restrictions  Yard Work;Interpersonal Relationship;Community Activity    Stability/Clinical Decision Making  Evolving/Moderate complexity    Rehab Potential  Fair    PT Frequency  3x / week    PT Duration  12 weeks    PT Treatment/Interventions  ADLs/Self Care Home Management;Electrical Stimulation;Moist Heat;Cryotherapy;Gait training;Stair training;Functional mobility training;Therapeutic exercise;Balance training;Neuromuscular re-education;Cognitive remediation;Patient/family education;Orthotic Fit/Training;Wheelchair mobility training;Manual techniques;Passive range of motion;Energy conservation;Joint Manipulations    PT Next Visit Plan  Work on strength, sitting/standing balance, functional activities such as rolling, bed mobility, transfers    PT Home Exercise Plan  handout provided via Beth during previous sessions    Consulted and Agree with Plan of Care  Patient;Family member/caregiver    Family Member Consulted  daughter [name]       Patient will benefit from skilled therapeutic intervention in order to improve the following deficits and impairments:  Abnormal gait, Decreased activity tolerance, Decreased cognition, Decreased endurance, Decreased knowledge of use of DME, Decreased range of motion, Decreased skin integrity, Decreased strength,  Impaired perceived functional ability, Impaired sensation, Impaired UE functional use, Improper body mechanics, Pain, Cardiopulmonary status limiting activity, Decreased balance, Decreased coordination, Decreased mobility, Difficulty walking, Impaired tone, Postural dysfunction  Visit Diagnosis: Muscle weakness (generalized)  Difficulty in walking, not elsewhere classified  Other lack of coordination     Problem List Patient Active Problem List   Diagnosis Date Noted  . Sepsis ([place]) 10/18/2018      This entire session was performed under direct supervision and direction of a licensed therapist/therapist assistant . I have personally read, edited and approve of the note as written.   Lutricia Horsfall, SPT Phillips Grout PT, DPT, GCS  Huprich,Jason 10/25/2018, 11:56 AM  [place] MAIN [facility] SERVICES [address] [place], [place], [address] Phone: [phone]   Fax:  [phone]  Name: Kenneth Summers MRN: [id number] Date of Birth: 10-06-1945

## 2018-10-28 ENCOUNTER — Other Ambulatory Visit: Payer: Self-pay

## 2018-10-28 ENCOUNTER — Encounter: Payer: Self-pay | Admitting: Occupational Therapy

## 2018-10-28 ENCOUNTER — Ambulatory Visit: Payer: Medicare Other

## 2018-10-28 ENCOUNTER — Ambulatory Visit: Payer: Medicare Other | Admitting: Occupational Therapy

## 2018-10-28 DIAGNOSIS — R278 Other lack of coordination: Secondary | ICD-10-CM

## 2018-10-28 DIAGNOSIS — M6281 Muscle weakness (generalized): Secondary | ICD-10-CM

## 2018-10-28 DIAGNOSIS — R262 Difficulty in walking, not elsewhere classified: Secondary | ICD-10-CM

## 2018-10-28 NOTE — Therapy (Addendum)
[place] MAIN [facility] SERVICES [address] [place], [place], [address] Phone: [phone]   Fax:  [phone]  Occupational Therapy Progress Note  Dates of reporting period  09/26/2018  to   10/28/2018  Patient Details  Name: Kenneth Summers MRN: [id number] Date of Birth: 23-Apr-1945 No data recorded  Encounter Date: 10/28/2018  OT End of Session - 10/28/18 1355    Visit Number  50    Number of Visits  69    Date for OT Re-Evaluation  01/15/19    Authorization Type  Progress report period starting 10/30/2018    OT Start Time  1300    OT Stop Time  1345    OT Time Calculation (min)  45 min    Activity Tolerance  Patient tolerated treatment well    Behavior During Therapy  [facility] for tasks assessed/performed       Past Medical History:  Diagnosis Date  . Alcohol abuse   . APS (antiphospholipid syndrome) ([place])   . BPH (benign prostatic hyperplasia)   . CAD (coronary artery disease)   . Cellulitis   . Cervical spinal stenosis   . Chronic osteomyelitis ([place])   . CKD (chronic kidney disease)   . CKD (chronic kidney disease)   . Clostridium difficile diarrhea   . Depression   . Diplopia   . Fatigue   . GERD (gastroesophageal reflux disease)   . Hyperlipemia   . Hypertension   . Hypovitaminosis D   . IBS (irritable bowel syndrome)   . IgA deficiency ([place])   . Insomnia   . Left lumbosacral radiculopathy   . Moderate obstructive sleep apnea   . Osteomyelitis of foot ([place])   . PAD (peripheral artery disease) ([place])   . Pruritus   . RA (rheumatoid arthritis) ([place])   . Radiculopathy   . Restless legs syndrome   . RLS (restless legs syndrome)   . SLE (systemic lupus erythematosus related syndrome) ([employer])   . Stroke ([place])   . Toxic maculopathy of both eyes     Past Surgical History:  Procedure Laterality Date  . CARDIAC CATHETERIZATION    . CAROTID ENDARTERECTOMY    . CERVICAL LAMINECTOMY    . CORONARY ANGIOPLASTY    . FRACTURE  SURGERY    . fusion C5-6-7    . HERNIA REPAIR    . KNEE ARTHROSCOPY    . TONSILLECTOMY      There were no vitals filed for this visit.  Subjective Assessment - 10/28/18 1354    Subjective   Pt. presents with a cough today.    Patient is accompanied by:  Family member    Pertinent History  Pt. is a 73 y.o. 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 Pt. PMHx includes: Multiple Falls, Lupus, DJD, Renal Abscess, CVA. Pt. resides with his wife. Pt.'s wife and daughter assist with ADLs. Pt. has caregivers in  for 2 hours a day, 6 days a week. Pt. received Rehab services in acute care, at SNF for STR, and [facility]. Pt. is retired from [employer] for [employer] and [employer].    Patient Stated Goals  To regain use of his left UE, and do more for himself.    Currently in Pain?  No/denies      OT TREATMENT    Neuro muscular re-education  Pt. worked on using his LUE for grasping, and manipulating 1/2" washers from a magnetic dish using  2pt. point pinch grasp pattern. Pt. worked on reaching up, stabilizing, and sustaining shoulder elevation while placing the washer over a small precise target on vertical dowels positioned at various angles. Pt. worked on flexing forward with his trunk, and reaching out to place the washers onto dowels placed at vertical, diagonal, and horizontal angles.  Response to Treatment  Pt. presented with improved initiation with the LUE requiring no cues to use his LUE. Pt. presents with improved trunk control when reaching with his LUE. Pt. continues to work on improving LUE functioning for improved engagement during ADLs, and IADL tasks.                     OT Education - 10/28/18 1355    Education Details  LUE functioning, goals    Methods  Explanation;Demonstration    Comprehension  Verbalized understanding;Returned demonstration;Verbal cues required          OT Long Term Goals - 10/28/18 1532       OT LONG TERM GOAL #1   Title  Pt. will increase left shoulder flexion AROM by 10 degrees to access cabinet/shelf.    Baseline  Pt. has difficulty reaching for items with the LUE secondary to decreased LUE ROM    Time  12    Period  Weeks    Status  On-going    Target Date  01/15/19      OT LONG TERM GOAL #2   Title  Pt. will donn a shirt with Supervision.    Baseline  Min    Time  12    Period  Weeks    Status  On-going    Target Date  01/15/19      OT LONG TERM GOAL #3   Title  Pt. will require ModA to perform LE dressing    Baseline  MaxA left, ModA with the right    Time  12    Period  Weeks    Status  On-going    Target Date  01/15/19      OT LONG TERM GOAL #4   Title  Pt. will improve left grip strength by 10# to be able to open a jar/container.    Baseline  Decreased grip since hospital readmission, pt. has difficulty opening containers    Time  12    Period  Weeks    Status  On-going    Target Date  01/15/19      OT LONG TERM GOAL #5   Title  Pt. will improve left hand [facility] skills to be able to assit with buttoning/zipping.    Baseline  Pt. has difficulty [facility] skills buttoning/zipping    Time  12    Period  Weeks    Status  On-going    Target Date  01/15/19      OT LONG TERM GOAL #6   Title  Pt. will demonstrate visual conmpensatory strategies during 100% of the time during ADLs    Baseline  Pt. requires cues for left sides awareness.    Time  12    Period  Weeks    Status  On-going    Target Date  01/15/19      OT LONG TERM GOAL #7   Title  Pt. will prepare a simple cold snack from w/c with supervision using cognitive compensatory strategies 100% of the time.    Baseline  Pt. is unable    Time  12    Period  Weeks  Target Date  11/27/18      OT LONG TERM GOAL #8   Title  Pt. will accurately identify potential safety hazard using good safety awareness, and judgement 100% for ADLs, and IADLs.    Baseline  Limited    Time  12    Period  Weeks     Status  On-going    Target Date  11/27/18      OT LONG TERM GOAL  #9   TITLE  Pt. will  navigate his w/c around obstacles with Supervision and 100% accuracy    Baseline  Pt. requires cuing for obstacle on the left    Time  12    Period  Weeks    Status  On-going    Target Date  11/27/18            Plan - 10/28/18 1356    Clinical Impression Statement Pt. reported feeling better overall today. This past weekend pt. sat on the EOB, and performed UE bathing, and dressing. Pt. and his daughter reported that they tried to do some standing from the w/c. Pt. presented with improved initiation with the LUE requiring no cues to use his LUE. Pt. presents with improved trunk control when reaching with his LUE. Pt. continues to work on improving LUE functioning for improved engagement during ADLs, and IADL tasks.   OT Occupational Profile and History  Problem Focused Assessment - Including review of records relating to presenting problem    Occupational performance deficits (Please refer to evaluation for details):  ADL's    Body Structure / Function / Physical Skills  UE functional use;Coordination;FMC;Dexterity;Strength;ROM    Cognitive Skills  Attention;Memory;Emotional;Problem Solve;Safety Awareness    Rehab Potential  Good    Clinical Decision Making  Several treatment options, min-mod task modification necessary    Comorbidities impacting occupational performance description:  Phyical, cognitive, visual,  medical comorbidities    Modification or Assistance to Complete Evaluation   No modification of tasks or assist necessary to complete eval    OT Frequency  3x / week    OT Duration  12 weeks    OT Treatment/Interventions  Self-care/ADL training;DME and/or AE instruction;Therapeutic exercise;Therapeutic activities;Moist Heat;Cognitive remediation/compensation;Neuromuscular education;Visual/perceptual remediation/compensation;Coping strategies training;Patient/family education;Passive range of  motion;Psychosocial skills training;Energy conservation;Functional Mobility Training    Consulted and Agree with Plan of Care  Patient    Family Member Consulted  Daughter [name]       Patient will benefit from skilled therapeutic intervention in order to improve the following deficits and impairments:   Body Structure / Function / Physical Skills: UE functional use, Coordination, FMC, Dexterity, Strength, ROM Cognitive Skills: Attention, Memory, Emotional, Problem Solve, Safety Awareness     Visit Diagnosis: Muscle weakness (generalized)  Other lack of coordination    Problem List Patient Active Problem List   Diagnosis Date Noted  . Sepsis ([place]) 10/18/2018    Harrel Carina, MS, OTR/L 10/28/2018, 3:47 PM  [place] MAIN [facility] SERVICES [address] [place], [place], [address] Phone: [phone]   Fax:  [phone]  Name: Kenneth Summers MRN: [id number] Date of Birth: 1945-03-18

## 2018-10-28 NOTE — Therapy (Signed)
[place] MAIN [facility] SERVICES [address] [place], [place], [address] Phone: [phone]   Fax:  [phone]  Physical Therapy Treatment  Patient Details  Name: Kenneth Summers MRN: [id number] Date of Birth: [birth year] Referring Provider (PT): Dr. [name], Guy Begin   Encounter Date: 10/28/2018  PT End of Session - 10/28/18 1301    Visit Number  65    Number of Visits  66    Date for PT Re-Evaluation  12/04/18    Authorization Type  Medicare reporting period starting 09/11/18    Authorization Time Period  Current cert period 02/15/6220-97/98/92    PT Start Time  1345    PT Stop Time  1430    PT Time Calculation (min)  45 min    Equipment Utilized During Treatment  Gait belt    Activity Tolerance  Patient limited by fatigue    Behavior During Therapy  [facility] for tasks assessed/performed       Past Medical History:  Diagnosis Date  . Alcohol abuse   . APS (antiphospholipid syndrome) ([place])   . BPH (benign prostatic hyperplasia)   . CAD (coronary artery disease)   . Cellulitis   . Cervical spinal stenosis   . Chronic osteomyelitis ([place])   . CKD (chronic kidney disease)   . CKD (chronic kidney disease)   . Clostridium difficile diarrhea   . Depression   . Diplopia   . Fatigue   . GERD (gastroesophageal reflux disease)   . Hyperlipemia   . Hypertension   . Hypovitaminosis D   . IBS (irritable bowel syndrome)   . 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 Insomnia   . Left lumbosacral radiculopathy   . Moderate obstructive sleep apnea   . Osteomyelitis of foot ([place])   . PAD (peripheral artery disease) ([place])   . Pruritus   . RA (rheumatoid arthritis) ([place])   . Radiculopathy   . Restless legs syndrome   . RLS (restless legs syndrome)   . SLE (systemic lupus erythematosus related syndrome) ([place])   . Stroke ([place])   . Toxic maculopathy of both eyes     Past Surgical History:  Procedure Laterality Date  . CARDIAC CATHETERIZATION    . CAROTID  ENDARTERECTOMY    . CERVICAL LAMINECTOMY    . CORONARY ANGIOPLASTY    . FRACTURE SURGERY    . fusion C5-6-7    . HERNIA REPAIR    . KNEE ARTHROSCOPY    . TONSILLECTOMY      There were no vitals filed for this visit.  Subjective Assessment - 10/28/18 1555    Subjective  Pt states that he is feeling better today.  He reports no pain or soreness today.  No new questions or concerns today.    Pertinent History  Patient is a 73 y.o. male who presents to outpatient physical therapy with a referral for medical diagnosis of CVA. This patient's chief complaints consist of left hemiplegia and overall deconditioning leading to the following functional deficits: dependent for ADLs, IADLs, unable to transfer to car, dependent transfers at home with hoyer lift, unable to walk or stand without significant assistance, difficulty with W/C navigation..    Limitations  Sitting;Lifting;Standing;House hold activities;Writing;Walking;Other (comment)    Diagnostic tests  Brain MRI 11/01/2017: IMPRESSION: 1. Acute right ACA territory infarct as demonstrated on prior CT imaging. No intracranial hemorrhage or significant mass effect. 2. 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    Currently  in Pain?  No/denies         TREATMENT:      Transferred wheelchair<>mat table mod A +2 with a squat pivot transfer; patient required increased cues for proper positioning and increased time to prepare to transfer;           Transitioned to supine, min A +2 with assistance for lifting LLE and cues for proper positioning;   Rolling R x5 with external cueing provided to reach for therapists hand to encourage hip rotation coming into sidelying.  Pt is able to roll bilaterally without assistance at hips, but requires the visual cueing of reaching for the therapists hand today.  He displays more difficulty with rolling to the L.      Rolled to prone mod A +2, required increased time and mod VCs  for UE positioning and assistance for LE positioning;     Transitioned to qped max A +3 with assistance bilaterally at trunk and UE, and 1 person blocking his LEs to prevent sliding. (no physioball used)               Cues for erect posture, weight bearing through UE and increase upper trunk control ~2-3 min with 1 rest break; patient able to hold self up into qped with supervision in neutral position x30 sec with physioball with good weight bearing in BUE/BLE;   Qped: forward/backward weight shift x5 reps with min A +2 for balance and mod VCs for proper positioning and trunk activation;      Transferred into sitting on edge of mat mod+1 and min+1    Transferred sit<>stand from elevated mat table mod A+1 in front and minA +1 behind at hips to assist with coming into a full stand.  Once in standing, minA+1 for steadying. with mod-max VCs for proper hand placement; He was able to stand with improved erect posture and better trunk extension but requires cues and minA+2 to achieve full hip extension; completed 2 sit<> stand transfers for about 1 min each.     Stand pivot transfer with modA+1 in front and minA+1 at ischial tuberosities to transfer from mat table to wheelchair with increased time required and cues for weight shift for proper positioning;         Response to treatment: Patient displayed good motivation throughout today's session.  He demonstrates improvement with rolling today, able to complete bilaterally with external cues to reach for the therapists hand.  He was also able to perform STS transfers and stand pivot transfers from the mat table today with modA in front from the PT and minA behind him at his ischial tuberosities to assist with with hip extension and good trunk control.  Pt will benefit from continued skilled PT services in order to increase strength and functional mobility in order to decrease caregiver burden, improve his ability to perform ADLs, and to improve his QOL.                     PT Short Term Goals - 10/23/18 1314      PT SHORT TERM GOAL #1   Title  Be independent with initial home exercise program for self-management of symptoms.    Baseline  HEP to be given at second session; advice to practice unsupported sitting at home (06/19/2018); 07/08/18: Performing at home, 6/29: needs assistance but is doing exercise program regularly; 08/22/18: adherent;    Time  2    Period  Weeks    Status  Achieved    Target Date  06/26/18        PT Long Term Goals - 10/23/18 1315      PT LONG TERM GOAL #1   Title  Patient will complete all bed mobility with min A to improve functional independence for getting in and out of bed and adjusting in bed.     Baseline  max A - total A reported by family (06/19/2018); 07/08/18: still requires maxA, dtr reports improvement in rolling since starting therapy; 6/23: min A to supervision in clinic; not doing at home right; 8/5: Pt's daughter states that his rolling has improved, but still has difficulty with all other bed mobility; 10/23/18: pts daughter states that she is doing about 35% of the work if he is in proper position, he does uses the bed rails to help roll;  using Hoyer for coming to sit EOB    Time  12    Period  Weeks    Status  Partially Met    Target Date  12/04/18      PT LONG TERM GOAL #2   Title  Patient will complete sit <> stand transfer chair to chair with Min A and LRAD to improve functional independence for household and community mobility.     Baseline  max A +2 STS at W/C (06/12/2018); max A +2-3 STS or stand pivot transfer, able to complete more reps (06/19/2018); 07/08/18: not safe to attempt at this time; 09/11/18: requires maxA+1 or modA+2 for STSs and squat pivot transfer; 10/23/18: requires maxA+1 or modA+2 for STSs and squat pivot transfer    Time  12    Period  Weeks    Status  On-going    Target Date  12/04/18      PT LONG TERM GOAL #3   Title  Patient will navigate power w/c with min  A x 100 feet to improve mobility for household and short community distances.    Baseline  required total A (06/12/2018); able to roll 20 feet with min A (06/20/2018); 07/08/18: Pt can go approximately 10' without assist per family;  10/23/2018: pt's daughter states that his power WC mobility has become 100% better with the new hand control, slowed speed, and with practice    Time  12    Period  Weeks    Status  Achieved    Target Date  10/23/18      PT LONG TERM GOAL #4   Title  Patient will complete car transfers W/C to family SUV with min A using LRAD to improve his abilty to participate in community activities.     Baseline  unable to complete car transfers at all (06/12/2018); unable to complete car transfers at all (06/19/2018).; 07/08/18: Unable to perform at this time; 09/11/18: unable to perform at this time; 10/23/2018: unable to complete car transfers at all    Time  12    Period  Weeks    Status  On-going    Target Date  12/04/18      PT LONG TERM GOAL #5   Title  Patient will ambulate at least 150 feet with mod I using LRAD to improve mobility for household and community distances.     Baseline  unable to take steps (06/12/2018); able to shift R leg forward and back with max A and RW at edge of plinth (06/19/2018); 07/08/18: unable to ambulate at this time. 09/11/18: unable to perform at this time; 10/23/2018: unable to perform at this  time    Time  12    Period  Weeks    Status  On-going    Target Date  12/04/18      PT LONG TERM GOAL #6   Title  Patient will navigate 4 steps with min A and BUE support to improve his ability to access community and social participation.     Baseline  unable to complet stairs (06/12/2018); unable to complete stairs (06/19/2018); 07/08/18: unable to attempt at this time; 09/11/18: unsafe to attempt at this time; 10/23/2018: unable to complet stairs (06/12/2018); unable to complete stairs (06/19/2018); 07/08/18: unable to attempt at this time; 09/11/18: unsafe to attempt at this  time; 10/23/18: unsafe to attempt at this time    Time  12    Period  Weeks    Status  On-going    Target Date  12/04/18      PT LONG TERM GOAL #7   Title  Patient will increase functional reach test to >15 inches in sitting to exhibit improved sitting balance and positioning and reduce fall risk;    Baseline  07/30/18: 12 inches; 09/11/18: 10-12 inches; 10/23/18: 12-13 inch    Time  4    Period  Weeks    Status  Partially Met    Target Date  12/04/18            Plan - 10/28/18 1716    Clinical Impression Statement  Patient displayed good motivation throughout today's session.  He demonstrates improvement with rolling today, able to complete bilaterally with external cues to reach for the therapists hand.  He was also able to perform STS transfers and stand pivot transfers from the mat table today with modA in front from the PT and minA behind him at his ischial tuberosities to assist with with hip extension and good trunk control.  Pt will benefit from continued skilled PT services in order to increase strength and functional mobility in order to decrease caregiver burden, improve his ability to perform ADLs, and to improve his QOL.    Personal Factors and Comorbidities  Age;Comorbidity 3+    Comorbidities  Relevant past medical history and comorbidities include long term steroid use for lupus, CKD, chronic osteomyelitis, cervical spine stenosis, BPH, APS, alcohol abuse, peripheral artery disease, depression, GERD, obstructive sleep apnea, HTN, IBS, IgA deficiency, lumbar radiculopathy, RA, Stroke, toxic maculopathy in both eyes, systemic lupus erythematosus related syndrome, cardiac catheterization, carotid endarterectomy, cervical laminectomy, coronary angioplasty, Fusion C5-C7, knee arthroplasty, lumbar surgery.    Examination-Activity Limitations  Bed Mobility;Bend;Sit;Toileting;Stand;Stairs;Lift;Transfers;Squat;Locomotion Level;Carry;Dressing;Hygiene/Grooming;Continence     Examination-Participation Restrictions  Yard Work;Interpersonal Relationship;Community Activity    Stability/Clinical Decision Making  Evolving/Moderate complexity    Rehab Potential  Fair    PT Frequency  3x / week    PT Duration  12 weeks    PT Treatment/Interventions  ADLs/Self Care Home Management;Electrical Stimulation;Moist Heat;Cryotherapy;Gait training;Stair training;Functional mobility training;Therapeutic exercise;Balance training;Neuromuscular re-education;Cognitive remediation;Patient/family education;Orthotic Fit/Training;Wheelchair mobility training;Manual techniques;Passive range of motion;Energy conservation;Joint Manipulations    PT Next Visit Plan  Work on strength, sitting/standing balance, functional activities such as rolling, bed mobility, transfers    PT Home Exercise Plan  handout provided via Beth during previous sessions    Consulted and Agree with Plan of Care  Patient;Family member/caregiver    Family Member Consulted  daughter [name]       Patient will benefit from skilled therapeutic intervention in order to improve the following deficits and impairments:  Abnormal gait, Decreased activity tolerance, Decreased cognition,  Decreased endurance, Decreased knowledge of use of DME, Decreased range of motion, Decreased skin integrity, Decreased strength, Impaired perceived functional ability, Impaired sensation, Impaired UE functional use, Improper body mechanics, Pain, Cardiopulmonary status limiting activity, Decreased balance, Decreased coordination, Decreased mobility, Difficulty walking, Impaired tone, Postural dysfunction  Visit Diagnosis: Muscle weakness (generalized)  Other lack of coordination  Difficulty in walking, not elsewhere classified     Problem List Patient Active Problem List   Diagnosis Date Noted  . Sepsis ([place]) 10/18/2018    This entire session was performed under direct supervision and direction of a licensed therapist/therapist assistant .  I have personally read, edited and approve of the note as written.   Lutricia Horsfall, SPT Phillips Grout PT, DPT, GCS  Huprich,Jason 10/29/2018, 10:12 AM  [place] MAIN [facility] SERVICES [address] [place], [place], [address] Phone: [phone]   Fax:  [phone]  Name: Kenneth Summers MRN: [id number] Date of Birth: [birth year]

## 2018-10-30 ENCOUNTER — Ambulatory Visit: Payer: Medicare Other | Admitting: Occupational Therapy

## 2018-10-30 ENCOUNTER — Encounter: Payer: Self-pay | Admitting: Occupational Therapy

## 2018-10-30 ENCOUNTER — Ambulatory Visit: Payer: Medicare Other | Admitting: Physical Therapy

## 2018-10-30 ENCOUNTER — Encounter: Payer: Self-pay | Admitting: Physical Therapy

## 2018-10-30 ENCOUNTER — Other Ambulatory Visit: Payer: Self-pay

## 2018-10-30 DIAGNOSIS — R278 Other lack of coordination: Secondary | ICD-10-CM

## 2018-10-30 DIAGNOSIS — M6281 Muscle weakness (generalized): Secondary | ICD-10-CM

## 2018-10-30 DIAGNOSIS — R262 Difficulty in walking, not elsewhere classified: Secondary | ICD-10-CM

## 2018-10-30 NOTE — Therapy (Signed)
[place] MAIN [facility] SERVICES [address] [place], [place], [address] Phone: [phone]   Fax:  [phone]  Physical Therapy Treatment  Patient Details  Name: Kenneth Summers MRN: [id number] Date of Birth: 1945-06-02 Referring Provider (PT): Dr. [name], Guy Begin   Encounter Date: 10/30/2018  PT End of Session - 10/30/18 1302    Visit Number  55    Number of Visits  79    Date for PT Re-Evaluation  12/04/18    Authorization Type  Medicare reporting period starting 09/11/18    Authorization Time Period  Current cert period 07/10/6801-21/22/48    PT Start Time  1302    PT Stop Time  1345    PT Time Calculation (min)  43 min    Equipment Utilized During Treatment  Gait belt    Activity Tolerance  Patient limited by fatigue    Behavior During Therapy  [facility] for tasks assessed/performed       Past Medical History:  Diagnosis Date  . Alcohol abuse   . APS (antiphospholipid syndrome) ([place])   . BPH (benign prostatic hyperplasia)   . CAD (coronary artery disease)   . Cellulitis   . Cervical spinal stenosis   . Chronic osteomyelitis ([place])   . CKD (chronic kidney disease)   . CKD (chronic kidney disease)   . Clostridium difficile diarrhea   . Depression   . Diplopia   . Fatigue   . GERD (gastroesophageal reflux disease)   . Hyperlipemia   . Hypertension   . Hypovitaminosis D   . IBS (irritable bowel syndrome)   . 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 Insomnia   . Left lumbosacral radiculopathy   . Moderate obstructive sleep apnea   . Osteomyelitis of foot ([place])   . PAD (peripheral artery disease) ([place])   . Pruritus   . RA (rheumatoid arthritis) ([place])   . Radiculopathy   . Restless legs syndrome   . RLS (restless legs syndrome)   . SLE (systemic lupus erythematosus related syndrome) ([place])   . Stroke ([place])   . Toxic maculopathy of both eyes     Past Surgical History:  Procedure Laterality Date  . CARDIAC CATHETERIZATION    . CAROTID  ENDARTERECTOMY    . CERVICAL LAMINECTOMY    . CORONARY ANGIOPLASTY    . FRACTURE SURGERY    . fusion C5-6-7    . HERNIA REPAIR    . KNEE ARTHROSCOPY    . TONSILLECTOMY      There were no vitals filed for this visit.  Subjective Assessment - 10/30/18 1307    Subjective  Pt reports feeling well; he denies any pain/soreness; Reports he has stood up 2x already today with family; Reports working on HEP well;    Pertinent History  Patient is a 73 y.o. male who presents to outpatient physical therapy with a referral for medical diagnosis of CVA. This patient's chief complaints consist of left hemiplegia and overall deconditioning leading to the following functional deficits: dependent for ADLs, IADLs, unable to transfer to car, dependent transfers at home with hoyer lift, unable to walk or stand without significant assistance, difficulty with W/C navigation..    Limitations  Sitting;Lifting;Standing;House hold activities;Writing;Walking;Other (comment)    Diagnostic tests  Brain MRI 11/01/2017: IMPRESSION: 1. Acute right ACA territory infarct as demonstrated on prior CT imaging. No intracranial hemorrhage or significant mass effect. 2. 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    Currently  in Pain?  No/denies    Multiple Pain Sites  No          TREATMENT: PT assessed patient/caregiver positioning and technique with sit<>stand transfer from wheelchair with RW. Patient exhibits increased posterior lean with daughter helping under patient's left arm and pulling him forward to stand;  PT educated patient/caregiver in ways to improve sit<>Stand transfers at home: Instructed caregiver to utilize gait belt with education on how to don/doff safely for better positioning to avoid pulling on shoulder; Patient also instructed to perform anterior pelvic rocks x5 reps with forward weight shift prior to transfer to facilitate better lumbopelvic mobility for improved  transfer ability;   Caregiver assisted patient in sit<>Stand transfer with RW with cues for patient to increase forward weight shift; Patient continues to have posterior lean upon partial stand; Instructed patient and caregiver to focus more on forward weight shift and coming into partial stand at home for better technique to challenge quad control (partial stands) x5 reps x2 sets with therapist/caregiver holding patient's left hand and using gait belt for lumbopelvic mobility;  Patient sitting in chair: Pt able to complete 5 reps of LAQ on LLE achieving 75% full ROM He was then able to partial DF/PF left foot 2-3 reps; Patient able to achieve good quad set 5 sec hold x2 rep; with good positioning; Re-educated patient/caregiver in HEP with patient verbalizing understanding;  Therapist stood in front of patient with assistance to patient's left side for steadying: Instructed patient in sit<>stand transfer with therapist providing anterior pelvic rock tactile cues and blocking knee for transfer. Patient able to stand from elevated wheelchair min A +2 for balance x2 sets with verbal and visual cues to improve erect posture. Patient fatigues quickly only able to hold standing position for 10-15 sec before sitting down;  Patient/caregiver would benefit from additional education in proper positioning for sit<>Stand transfers at home to improve transfer ability and functional strength;                      PT Education - 10/30/18 1301    Education Details  transfers/positioning;    Person(s) Educated  Patient;Child(ren)    Methods  Explanation;Verbal cues    Comprehension  Verbalized understanding;Returned demonstration;Verbal cues required;Need further instruction       PT Short Term Goals - 10/23/18 1314      PT SHORT TERM GOAL #1   Title  Be independent with initial home exercise program for self-management of symptoms.    Baseline  HEP to be given at second session; advice  to practice unsupported sitting at home (06/19/2018); 07/08/18: Performing at home, 6/29: needs assistance but is doing exercise program regularly; 08/22/18: adherent;    Time  2    Period  Weeks    Status  Achieved    Target Date  06/26/18        PT Long Term Goals - 10/23/18 1315      PT LONG TERM GOAL #1   Title  Patient will complete all bed mobility with min A to improve functional independence for getting in and out of bed and adjusting in bed.     Baseline  max A - total A reported by family (06/19/2018); 07/08/18: still requires maxA, dtr reports improvement in rolling since starting therapy; 6/23: min A to supervision in clinic; not doing at home right; 8/5: Pt's daughter states that his rolling has improved, but still has difficulty with all other bed mobility; 10/23/18: pts  daughter states that she is doing about 35% of the work if he is in proper position, he does uses the bed rails to help roll;  using Hoyer for coming to sit EOB    Time  12    Period  Weeks    Status  Partially Met    Target Date  12/04/18      PT LONG TERM GOAL #2   Title  Patient will complete sit <> stand transfer chair to chair with Min A and LRAD to improve functional independence for household and community mobility.     Baseline  max A +2 STS at W/C (06/12/2018); max A +2-3 STS or stand pivot transfer, able to complete more reps (06/19/2018); 07/08/18: not safe to attempt at this time; 09/11/18: requires maxA+1 or modA+2 for STSs and squat pivot transfer; 10/23/18: requires maxA+1 or modA+2 for STSs and squat pivot transfer    Time  12    Period  Weeks    Status  On-going    Target Date  12/04/18      PT LONG TERM GOAL #3   Title  Patient will navigate power w/c with min A x 100 feet to improve mobility for household and short community distances.    Baseline  required total A (06/12/2018); able to roll 20 feet with min A (06/20/2018); 07/08/18: Pt can go approximately 10' without assist per family;  10/23/2018: pt's  daughter states that his power WC mobility has become 100% better with the new hand control, slowed speed, and with practice    Time  12    Period  Weeks    Status  Achieved    Target Date  10/23/18      PT LONG TERM GOAL #4   Title  Patient will complete car transfers W/C to family SUV with min A using LRAD to improve his abilty to participate in community activities.     Baseline  unable to complete car transfers at all (06/12/2018); unable to complete car transfers at all (06/19/2018).; 07/08/18: Unable to perform at this time; 09/11/18: unable to perform at this time; 10/23/2018: unable to complete car transfers at all    Time  12    Period  Weeks    Status  On-going    Target Date  12/04/18      PT LONG TERM GOAL #5   Title  Patient will ambulate at least 150 feet with mod I using LRAD to improve mobility for household and community distances.     Baseline  unable to take steps (06/12/2018); able to shift R leg forward and back with max A and RW at edge of plinth (06/19/2018); 07/08/18: unable to ambulate at this time. 09/11/18: unable to perform at this time; 10/23/2018: unable to perform at this time    Time  12    Period  Weeks    Status  On-going    Target Date  12/04/18      PT LONG TERM GOAL #6   Title  Patient will navigate 4 steps with min A and BUE support to improve his ability to access community and social participation.     Baseline  unable to complet stairs (06/12/2018); unable to complete stairs (06/19/2018); 07/08/18: unable to attempt at this time; 09/11/18: unsafe to attempt at this time; 10/23/2018: unable to complet stairs (06/12/2018); unable to complete stairs (06/19/2018); 07/08/18: unable to attempt at this time; 09/11/18: unsafe to attempt at this time; 10/23/18: unsafe to  attempt at this time    Time  12    Period  Weeks    Status  On-going    Target Date  12/04/18      PT LONG TERM GOAL #7   Title  Patient will increase functional reach test to >15 inches in sitting to exhibit improved  sitting balance and positioning and reduce fall risk;    Baseline  07/30/18: 12 inches; 09/11/18: 10-12 inches; 10/23/18: 12-13 inch    Time  4    Period  Weeks    Status  Partially Met    Target Date  12/04/18            Plan - 10/30/18 1524    Clinical Impression Statement  PT assessed patient/caregiver positioning with sit<>Stand transfers from wheelchair that they are doing at home. Educated patient and caregiver in ways to improve positioning to increase patient participation and improve ease of transfer. Patient seems to respond well to tactile cues for anterior pelvic weight shift to help facilitate sit to stand transfer. He exhibits increased fatigue with increased repetitions. Patient is improving in strength with improved AROM in LLE. He would benefit from additional skilled PT Intervention to improve strength, balance and mobility;    Personal Factors and Comorbidities  Age;Comorbidity 3+    Comorbidities  Relevant past medical history and comorbidities include long term steroid use for lupus, CKD, chronic osteomyelitis, cervical spine stenosis, BPH, APS, alcohol abuse, peripheral artery disease, depression, GERD, obstructive sleep apnea, HTN, IBS, IgA deficiency, lumbar radiculopathy, RA, Stroke, toxic maculopathy in both eyes, systemic lupus erythematosus related syndrome, cardiac catheterization, carotid endarterectomy, cervical laminectomy, coronary angioplasty, Fusion C5-C7, knee arthroplasty, lumbar surgery.    Examination-Activity Limitations  Bed Mobility;Bend;Sit;Toileting;Stand;Stairs;Lift;Transfers;Squat;Locomotion Level;Carry;Dressing;Hygiene/Grooming;Continence    Examination-Participation Restrictions  Yard Work;Interpersonal Relationship;Community Activity    Stability/Clinical Decision Making  Evolving/Moderate complexity    Rehab Potential  Fair    PT Frequency  3x / week    PT Duration  12 weeks    PT Treatment/Interventions  ADLs/Self Care Home Management;Electrical  Stimulation;Moist Heat;Cryotherapy;Gait training;Stair training;Functional mobility training;Therapeutic exercise;Balance training;Neuromuscular re-education;Cognitive remediation;Patient/family education;Orthotic Fit/Training;Wheelchair mobility training;Manual techniques;Passive range of motion;Energy conservation;Joint Manipulations    PT Next Visit Plan  Work on strength, sitting/standing balance, functional activities such as rolling, bed mobility, transfers    PT Home Exercise Plan  handout provided via Beth during previous sessions    Consulted and Agree with Plan of Care  Patient;Family member/caregiver    Family Member Consulted  daughter [name]       Patient will benefit from skilled therapeutic intervention in order to improve the following deficits and impairments:  Abnormal gait, Decreased activity tolerance, Decreased cognition, Decreased endurance, Decreased knowledge of use of DME, Decreased range of motion, Decreased skin integrity, Decreased strength, Impaired perceived functional ability, Impaired sensation, Impaired UE functional use, Improper body mechanics, Pain, Cardiopulmonary status limiting activity, Decreased balance, Decreased coordination, Decreased mobility, Difficulty walking, Impaired tone, Postural dysfunction  Visit Diagnosis: Muscle weakness (generalized)  Other lack of coordination  Difficulty in walking, not elsewhere classified     Problem List Patient Active Problem List   Diagnosis Date Noted  . Sepsis ([place]) 10/18/2018    Venissa Nappi PT, DPT 10/30/2018, 3:26 PM  [place] MAIN [facility] SERVICES [address] [place], [place], [address] Phone: [phone]   Fax:  [phone]  Name: Kenneth Summers MRN: [id number] Date of Birth: 02-13-1945

## 2018-10-30 NOTE — Therapy (Addendum)
[place] MAIN [facility] SERVICES [address] [place], [place], [address] Phone: [phone]   Fax:  [phone]  Occupational Therapy Treatment  Patient Details  Name: Kenneth Summers MRN: [id number] Date of Birth: 08/20/1945 No data recorded  Encounter Date: 10/30/2018  OT End of Session - 10/30/18 1553    Visit Number  84    Number of Visits  21   Date for OT Re-Evaluation  01/15/19    Authorization Type  Progress report period starting 10/30/2018    OT Start Time  1350    OT Stop Time  1425    OT Time Calculation (min)  35 min    Activity Tolerance  Patient tolerated treatment well    Behavior During Therapy  [facility] for tasks assessed/performed       Past Medical History:  Diagnosis Date  . Alcohol abuse   . APS (antiphospholipid syndrome) ([place])   . BPH (benign prostatic hyperplasia)   . CAD (coronary artery disease)   . Cellulitis   . Cervical spinal stenosis   . Chronic osteomyelitis ([place])   . CKD (chronic kidney disease)   . CKD (chronic kidney disease)   . Clostridium difficile diarrhea   . Depression   . Diplopia   . Fatigue   . GERD (gastroesophageal reflux disease)   . Hyperlipemia   . Hypertension   . Hypovitaminosis D   . IBS (irritable bowel syndrome)   . 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 Insomnia   . Left lumbosacral radiculopathy   . Moderate obstructive sleep apnea   . Osteomyelitis of foot ([place])   . PAD (peripheral artery disease) ([place])   . Pruritus   . RA (rheumatoid arthritis) ([place])   . Radiculopathy   . Restless legs syndrome   . RLS (restless legs syndrome)   . SLE (systemic lupus erythematosus related syndrome) ([place])   . Stroke ([place])   . Toxic maculopathy of both eyes     Past Surgical History:  Procedure Laterality Date  . CARDIAC CATHETERIZATION    . CAROTID ENDARTERECTOMY    . CERVICAL LAMINECTOMY    . CORONARY ANGIOPLASTY    . FRACTURE SURGERY    . fusion C5-6-7    . HERNIA REPAIR    . KNEE  ARTHROSCOPY    . TONSILLECTOMY      There were no vitals filed for this visit.  Subjective Assessment - 10/30/18 1552    Subjective   Pt. reports that his cough is breaking up.    Patient is accompanied by:  Family member    Pertinent History  Pt. is a 73 y.o. 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 Pt. PMHx includes: Multiple Falls, Lupus, DJD, Renal Abscess, CVA. Pt. resides with his wife. Pt.'s wife and daughter assist with ADLs. Pt. has caregivers in  for 2 hours a day, 6 days a week. Pt. received Rehab services in acute care, at SNF for STR, and [place] services. Pt. is retired from [employer] for [employer] and [employer].    Patient Stated Goals  To regain use of his left UE, and do more for himself.    Currently in Pain?  No/denies      OT TREATMENT    Neuro muscular re-education:  Pt. worked on bilateral [facility] skills manipulating nuts, and bolts on a bolt board.  Pt. worked on screwing, and unscrewing nuts, and bolts of varying sizes, and challenging  progressively smaller items. Pt. worked on manipulating the screws with his left hand with vision occluded, while stabilizing with the right hand. Pt. worked on [employer], and coordination skills while handling, and using tools. Pt. worked on Contractor, IT trainer, and screw drivers to remove screws, and bolts of varying sizes with his right hand, while stabilizing the screws with his left hand. Pt. was able to sustain attention for the duration of the task. Pt. required cues for rest breaks, and and upright midline sitting.  Response to Treatment  Pt. is feeling better, and has improved with his cough. Pt. has made progress overall with LUE functioning. Pt. is engaging his LUE more during ADL, and IADL tasks. Pt. continues to present with limited trunk control with leaning to the left during bilateral [facility] tasks. Pt. is able to right himself to midline with cues. Pt.  continues to work on improving LUE functioning during ADLs, and IADL tasks.                    OT Education - 10/30/18 1552    Education Details  LUE functioning, [place], trunk control    Person(s) Educated  Patient    Methods  Explanation;Demonstration    Comprehension  Verbalized understanding;Returned demonstration;Verbal cues required          OT Long Term Goals - 10/28/18 1532      OT LONG TERM GOAL #1   Title  Pt. will increase left shoulder flexion AROM by 10 degrees to access cabinet/shelf.    Baseline  Pt. has difficulty reaching for items with the LUE secondary to decreased LUE ROM    Time  12    Period  Weeks    Status  On-going    Target Date  01/15/19      OT LONG TERM GOAL #2   Title  Pt. will donn a shirt with Supervision.    Baseline  Min    Time  12    Period  Weeks    Status  On-going    Target Date  01/15/19      OT LONG TERM GOAL #3   Title  Pt. will require ModA to perform LE dressing    Baseline  MaxA left, ModA with the right    Time  12    Period  Weeks    Status  On-going    Target Date  01/15/19      OT LONG TERM GOAL #4   Title  Pt. will improve left grip strength by 10# to be able to open a jar/container.    Baseline  Decreased grip since hospital readmission, pt. has difficulty opening containers    Time  12    Period  Weeks    Status  On-going    Target Date  01/15/19      OT LONG TERM GOAL #5   Title  Pt. will improve left hand [facility] skills to be able to assit with buttoning/zipping.    Baseline  Pt. has difficulty [facility] skills buttoning/zipping    Time  12    Period  Weeks    Status  On-going    Target Date  01/15/19      OT LONG TERM GOAL #6   Title  Pt. will demonstrate visual conmpensatory strategies during 100% of the time during ADLs    Baseline  Pt. requires cues for left sides awareness.    Time  12  Period  Weeks    Status  On-going    Target Date  01/15/19      OT LONG TERM GOAL #7   Title  Pt. will  prepare a simple cold snack from w/c with supervision using cognitive compensatory strategies 100% of the time.    Baseline  Pt. is unable    Time  12    Period  Weeks    Target Date  11/27/18      OT LONG TERM GOAL #8   Title  Pt. will accurately identify potential safety hazard using good safety awareness, and judgement 100% for ADLs, and IADLs.    Baseline  Limited    Time  12    Period  Weeks    Status  On-going    Target Date  11/27/18      OT LONG TERM GOAL  #9   TITLE  Pt. will  navigate his w/c around obstacles with Supervision and 100% accuracy    Baseline  Pt. requires cuing for obstacle on the left    Time  12    Period  Weeks    Status  On-going    Target Date  11/27/18            Plan - 10/30/18 1554    Clinical Impression Statement Pt. is feeling better, and has improved with his cough. Pt. has made progress overall with LUE functioning. Pt. is engaging his LUE more during ADL, and IADL tasks. Pt. continues to present with limited trunk control with leaning to the left during bilateral [facility] tasks. Pt. is able to right himself to midline with cues. Pt. continues to work on improving LUE functioning during ADLs, and IADL tasks.    OT Occupational Profile and History  Problem Focused Assessment - Including review of records relating to presenting problem    Occupational performance deficits (Please refer to evaluation for details):  ADL's    Body Structure / Function / Physical Skills  UE functional use;Coordination;FMC;Dexterity;Strength;ROM    Cognitive Skills  Attention;Memory;Emotional;Problem Solve;Safety Awareness    Rehab Potential  Good    Clinical Decision Making  Several treatment options, min-mod task modification necessary    Comorbidities Affecting Occupational Performance:  Presence of comorbidities impacting occupational performance    Modification or Assistance to Complete Evaluation   No modification of tasks or assist necessary to complete eval    OT  Frequency  3x / week    OT Duration  12 weeks    OT Treatment/Interventions  Self-care/ADL training;DME and/or AE instruction;Therapeutic exercise;Therapeutic activities;Moist Heat;Cognitive remediation/compensation;Neuromuscular education;Visual/perceptual remediation/compensation;Coping strategies training;Patient/family education;Passive range of motion;Psychosocial skills training;Energy conservation;Functional Mobility Training    Consulted and Agree with Plan of Care  Patient    Family Member Consulted  Daughter [name]       Patient will benefit from skilled therapeutic intervention in order to improve the following deficits and impairments:   Body Structure / Function / Physical Skills: UE functional use, Coordination, FMC, Dexterity, Strength, ROM Cognitive Skills: Attention, Memory, Emotional, Problem Solve, Safety Awareness     Visit Diagnosis: Muscle weakness (generalized)  Other lack of coordination    Problem List Patient Active Problem List   Diagnosis Date Noted  . Sepsis ([place]) 10/18/2018    Harrel Carina, MS, OTR/L 10/30/2018, 5:01 PM  [place] MAIN [facility] SERVICES [address] [place], [place], [address] Phone: [phone]   Fax:  [phone]  Name: Kenneth Summers MRN: [id number] Date  of Birth: 08-08-1945

## 2018-10-31 ENCOUNTER — Ambulatory Visit: Payer: Medicare Other

## 2018-10-31 ENCOUNTER — Ambulatory Visit: Payer: Medicare Other | Admitting: Occupational Therapy

## 2018-10-31 ENCOUNTER — Encounter: Payer: Self-pay | Admitting: Occupational Therapy

## 2018-10-31 ENCOUNTER — Other Ambulatory Visit: Payer: Self-pay

## 2018-10-31 DIAGNOSIS — M6281 Muscle weakness (generalized): Secondary | ICD-10-CM | POA: Diagnosis not present

## 2018-10-31 DIAGNOSIS — R278 Other lack of coordination: Secondary | ICD-10-CM

## 2018-10-31 DIAGNOSIS — R262 Difficulty in walking, not elsewhere classified: Secondary | ICD-10-CM

## 2018-10-31 DIAGNOSIS — R41841 Cognitive communication deficit: Secondary | ICD-10-CM

## 2018-10-31 DIAGNOSIS — H547 Unspecified visual loss: Secondary | ICD-10-CM

## 2018-10-31 NOTE — Therapy (Addendum)
[place] MAIN [facility] SERVICES [address] [place], [place], [address] Phone: [phone]   Fax:  [phone]  Occupational Therapy Treatment  Patient Details  Name: Kenneth Summers MRN: [id number] Date of Birth: 1946/01/19 No data recorded  Encounter Date: 10/31/2018  OT End of Session - 10/31/18 1539    Visit Number  52    Number of Visits  75    Date for OT Re-Evaluation  01/15/19    Authorization Type  Progress report period starting 10/30/2018    OT Start Time  1350    OT Stop Time  1430    OT Time Calculation (min)  40 min    Activity Tolerance  Patient tolerated treatment well    Behavior During Therapy  [facility] for tasks assessed/performed       Past Medical History:  Diagnosis Date  . Alcohol abuse   . APS (antiphospholipid syndrome) ([place])   . BPH (benign prostatic hyperplasia)   . CAD (coronary artery disease)   . Cellulitis   . Cervical spinal stenosis   . Chronic osteomyelitis ([place])   . CKD (chronic kidney disease)   . CKD (chronic kidney disease)   . Clostridium difficile diarrhea   . Depression   . Diplopia   . Fatigue   . GERD (gastroesophageal reflux disease)   . Hyperlipemia   . Hypertension   . Hypovitaminosis D   . IBS (irritable bowel syndrome)   . 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 Insomnia   . Left lumbosacral radiculopathy   . Moderate obstructive sleep apnea   . Osteomyelitis of foot ([place])   . PAD (peripheral artery disease) ([place])   . Pruritus   . RA (rheumatoid arthritis) ([place])   . Radiculopathy   . Restless legs syndrome   . RLS (restless legs syndrome)   . SLE (systemic lupus erythematosus related syndrome) ([place])   . Stroke ([place])   . Toxic maculopathy of both eyes     Past Surgical History:  Procedure Laterality Date  . CARDIAC CATHETERIZATION    . CAROTID ENDARTERECTOMY    . CERVICAL LAMINECTOMY    . CORONARY ANGIOPLASTY    . FRACTURE SURGERY    . fusion C5-6-7    . HERNIA REPAIR    . KNEE  ARTHROSCOPY    . TONSILLECTOMY      There were no vitals filed for this visit.  Subjective Assessment - 10/31/18 1539    Subjective   Pt. reports feeling better    Patient is accompanied by:  Family member    Pertinent History  Pt. is a 73 y.o. 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 Pt. PMHx includes: Multiple Falls, Lupus, DJD, Renal Abscess, CVA. Pt. resides with his wife. Pt.'s wife and daughter assist with ADLs. Pt. has caregivers in  for 2 hours a day, 6 days a week. Pt. received Rehab services in acute care, at SNF for STR, and [place] services. Pt. is retired from [employer] for [employer] and [employer].    Currently in Pain?  No/denies      OT TREATMENT    Neuro muscular re-education:  Pt. worked on using his left hand to grasp pegs, and place them onto the [employer] pegboard following [employer] patterns. Pt. worked with the design pattern flat on the table, and the [employer] board placed at a vertical angle to encourage shoulder flexion, elbow extension, and wrist extension  when placing them onto the board. Pt. worked on using his LUE, and forward leaning with his trunk to grasp the pegs from the container. Pt. Required verbal cues to use his LUE, as well as increased cues for peg placement following the design closer to the end of the task. Pt. Required the position of the design pattern, and the board to be exchanged.  Response to Treatment  Pt. has is making steady progress overall with LUE strength, visual attention, left sided awareness, and problem solving skills. Pt. continues to require verbal cues to engage the LUE during tasks, however requires fewer cues today. Pt. reports having fatigue. Pt. continues to work on improving LUE functioning during ADLs, and IADL tasks.                         OT Education - 10/31/18 1539    Education Details  LUE functioning, [place], trunk control    Person(s)  Educated  Patient    Methods  Explanation;Demonstration    Comprehension  Verbalized understanding;Returned demonstration;Verbal cues required          OT Long Term Goals - 10/28/18 1532      OT LONG TERM GOAL #1   Title  Pt. will increase left shoulder flexion AROM by 10 degrees to access cabinet/shelf.    Baseline  Pt. has difficulty reaching for items with the LUE secondary to decreased LUE ROM    Time  12    Period  Weeks    Status  On-going    Target Date  01/15/19      OT LONG TERM GOAL #2   Title  Pt. will donn a shirt with Supervision.    Baseline  Min    Time  12    Period  Weeks    Status  On-going    Target Date  01/15/19      OT LONG TERM GOAL #3   Title  Pt. will require ModA to perform LE dressing    Baseline  MaxA left, ModA with the right    Time  12    Period  Weeks    Status  On-going    Target Date  01/15/19      OT LONG TERM GOAL #4   Title  Pt. will improve left grip strength by 10# to be able to open a jar/container.    Baseline  Decreased grip since hospital readmission, pt. has difficulty opening containers    Time  12    Period  Weeks    Status  On-going    Target Date  01/15/19      OT LONG TERM GOAL #5   Title  Pt. will improve left hand [facility] skills to be able to assit with buttoning/zipping.    Baseline  Pt. has difficulty [facility] skills buttoning/zipping    Time  12    Period  Weeks    Status  On-going    Target Date  01/15/19      OT LONG TERM GOAL #6   Title  Pt. will demonstrate visual conmpensatory strategies during 100% of the time during ADLs    Baseline  Pt. requires cues for left sides awareness.    Time  12    Period  Weeks    Status  On-going    Target Date  01/15/19      OT LONG TERM GOAL #7   Title  Pt. will prepare a simple  cold snack from w/c with supervision using cognitive compensatory strategies 100% of the time.    Baseline  Pt. is unable    Time  12    Period  Weeks    Target Date  11/27/18      OT LONG TERM  GOAL #8   Title  Pt. will accurately identify potential safety hazard using good safety awareness, and judgement 100% for ADLs, and IADLs.    Baseline  Limited    Time  12    Period  Weeks    Status  On-going    Target Date  11/27/18      OT LONG TERM GOAL  #9   TITLE  Pt. will  navigate his w/c around obstacles with Supervision and 100% accuracy    Baseline  Pt. requires cuing for obstacle on the left    Time  12    Period  Weeks    Status  On-going    Target Date  11/27/18            Plan - 10/31/18 1540    Clinical Impression Statement  Pt. has is making steady progress overall with LUE strength, visual attention, left sided awareness, and problem solving skills. Pt. continues to require verbal cues to engage the LUE during tasks, however requires fewer cues today. Pt. reports having fatigue. Pt. continues to work on improving LUE functioning during ADLs, and IADL tasks.    OT Occupational Profile and History  Problem Focused Assessment - Including review of records relating to presenting problem    Occupational performance deficits (Please refer to evaluation for details):  ADL's    Body Structure / Function / Physical Skills  UE functional use;Coordination;FMC;Dexterity;Strength;ROM    Cognitive Skills  Attention;Memory;Emotional;Problem Solve;Safety Awareness    Rehab Potential  Good    Clinical Decision Making  Several treatment options, min-mod task modification necessary    Comorbidities Affecting Occupational Performance:  Presence of comorbidities impacting occupational performance    Comorbidities impacting occupational performance description:  Phyical, cognitive, visual,  medical comorbidities    Modification or Assistance to Complete Evaluation   No modification of tasks or assist necessary to complete eval    OT Frequency  3x / week    OT Duration  12 weeks    OT Treatment/Interventions  Self-care/ADL training;DME and/or AE instruction;Therapeutic  exercise;Therapeutic activities;Moist Heat;Cognitive remediation/compensation;Neuromuscular education;Visual/perceptual remediation/compensation;Coping strategies training;Patient/family education;Passive range of motion;Psychosocial skills training;Energy conservation;Functional Mobility Training    Consulted and Agree with Plan of Care  Patient    Family Member Consulted  Daughter [name]       Patient will benefit from skilled therapeutic intervention in order to improve the following deficits and impairments:   Body Structure / Function / Physical Skills: UE functional use, Coordination, FMC, Dexterity, Strength, ROM Cognitive Skills: Attention, Memory, Emotional, Problem Solve, Safety Awareness     Visit Diagnosis: Muscle weakness (generalized)  Other lack of coordination  Vision impairment  Cognitive communication deficit    Problem List Patient Active Problem List   Diagnosis Date Noted  . Sepsis ([place]) 10/18/2018    Harrel Carina, MS, OTR/L 10/31/2018, 3:48 PM  [place] MAIN [facility] SERVICES [address] [place], [place], [address] Phone: [phone]   Fax:  [phone]  Name: Kenneth Summers MRN: [id number] Date of Birth: [birth year]

## 2018-10-31 NOTE — Therapy (Signed)
[place] MAIN [facility] SERVICES [address] [place], [place], [address] Phone: [phone]   Fax:  [phone]  Physical Therapy Treatment  Patient Details  Name: Kenneth Summers MRN: [id number] Date of Birth: [birth year] Referring Provider (PT): Dr. [name], Guy Begin   Encounter Date: 10/31/2018  PT End of Session - 10/31/18 1258    Visit Number  66    Number of Visits  1    Date for PT Re-Evaluation  12/04/18    Authorization Type  Medicare reporting period starting 09/11/18    Authorization Time Period  Current cert period 10/09/5699-77/93/90    PT Start Time  1300    PT Stop Time  1345    PT Time Calculation (min)  45 min    Equipment Utilized During Treatment  Gait belt    Activity Tolerance  Patient tolerated treatment well    Behavior During Therapy  [facility] for tasks assessed/performed       Past Medical History:  Diagnosis Date  . Alcohol abuse   . APS (antiphospholipid syndrome) ([place])   . BPH (benign prostatic hyperplasia)   . CAD (coronary artery disease)   . Cellulitis   . Cervical spinal stenosis   . Chronic osteomyelitis ([place])   . CKD (chronic kidney disease)   . CKD (chronic kidney disease)   . Clostridium difficile diarrhea   . Depression   . Diplopia   . Fatigue   . GERD (gastroesophageal reflux disease)   . Hyperlipemia   . Hypertension   . Hypovitaminosis D   . IBS (irritable bowel syndrome)   . IgA deficiency ([place])   . Insomnia   . Left lumbosacral radiculopathy   . Moderate obstructive sleep apnea   . Osteomyelitis of foot ([place])   . PAD (peripheral artery disease) ([place])   . Pruritus   . RA (rheumatoid arthritis) ([place])   . Radiculopathy   . Restless legs syndrome   . RLS (restless legs syndrome)   . SLE (systemic lupus erythematosus related syndrome) ([place])   . Stroke ([place])   . Toxic maculopathy of both eyes     Past Surgical History:  Procedure Laterality Date  . CARDIAC CATHETERIZATION    .  CAROTID ENDARTERECTOMY    . CERVICAL LAMINECTOMY    . CORONARY ANGIOPLASTY    . FRACTURE SURGERY    . fusion C5-6-7    . HERNIA REPAIR    . KNEE ARTHROSCOPY    . TONSILLECTOMY      There were no vitals filed for this visit.  Subjective Assessment - 10/31/18 1310    Subjective  Pt reports feeling well today.  He denies any pain/soreness.  No new questions or concerns at this point    Pertinent History  Patient is a 73 y.o. male who presents to outpatient physical therapy with a referral for medical diagnosis of CVA. This patient's chief complaints consist of left hemiplegia and overall deconditioning leading to the following functional deficits: dependent for ADLs, IADLs, unable to transfer to car, dependent transfers at home with hoyer lift, unable to walk or stand without significant assistance, difficulty with W/C navigation..    Limitations  Sitting;Lifting;Standing;House hold activities;Writing;Walking;Other (comment)    Diagnostic tests  Brain MRI 11/01/2017: IMPRESSION: 1. Acute right ACA territory infarct as demonstrated on prior CT imaging. No intracranial hemorrhage or significant mass effect. 2. 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    Currently in Pain?  No/denies  TREATMENT   PT reassessed patient/caregiver positioning and technique with sit<>stand transfer from wheelchair with RW. Patient exhibits increased posterior lean with daughter helping under patient's left arm and pulling him forward to stand;  Both caregiver and pt demonstrate good carryover of anterior to posterior pelvic tilts for improved lumbopelvic mobility during transfers   PT educated patient/caregiver in ways to improve sit<>stand transfers at home: Instructed caregiver to utilize gait belt place lower, ideally around the PSIS or ischial tuberosities, as well as to stand in front of the pt and elevate the WC.  Trialed both with RW and without RW.  Caregiver  demonstrates good body mechanics with cueing to block LLE and keeping arms straight to assist with using body weight as a fulcrum to assist patient into a stand.  ~15 STSs were performed from [facility] between PT and caregiver, with pt demonstrating fatigue as session progressed.  He continues to demonstrate a heavy posterior lean when coming into a stand.     Patient sitting in chair: Seated reaching in WC to various points to encourage anterior and lateral weight shifting Seated marching x10 performed bilaterally; able to achieve 50-60% of L hip flexion Seated LAQ x10 with the ability to gain 50% of ROM on LLE on 75% of reps.  Seated hip abduction performed bilaterally x10 Seated hip adduction performed bilaterally x10   Pt demonstrated excellent motivation throughout today's session.  He demonstrates improvements with L hip flexion and L LAQ, being able to achieve ~50% of ROM actively.  Extensive caregiver training was performed today to reinforce and practice proper body mechanics and techniques in order to experience success at home.  Patient/caregiver would benefit from additional education in proper positioning for sit<>Stand transfers at home to improve transfer ability and functional strength.  Pt will benefit from continued skilled PT services in order to increase strength and functional mobility in order to decrease caregiver burden, improve his ability to perform ADLs, and to improve his QOL.                 PT Short Term Goals - 10/23/18 1314      PT SHORT TERM GOAL #1   Title  Be independent with initial home exercise program for self-management of symptoms.    Baseline  HEP to be given at second session; advice to practice unsupported sitting at home (06/19/2018); 07/08/18: Performing at home, 6/29: needs assistance but is doing exercise program regularly; 08/22/18: adherent;    Time  2    Period  Weeks    Status  Achieved    Target Date  06/26/18        PT Long Term Goals  - 10/23/18 1315      PT LONG TERM GOAL #1   Title  Patient will complete all bed mobility with min A to improve functional independence for getting in and out of bed and adjusting in bed.     Baseline  max A - total A reported by family (06/19/2018); 07/08/18: still requires maxA, dtr reports improvement in rolling since starting therapy; 6/23: min A to supervision in clinic; not doing at home right; 8/5: Pt's daughter states that his rolling has improved, but still has difficulty with all other bed mobility; 10/23/18: pts daughter states that she is doing about 35% of the work if he is in proper position, he does uses the bed rails to help roll;  using Hoyer for coming to sit EOB    Time  12  Period  Weeks    Status  Partially Met    Target Date  12/04/18      PT LONG TERM GOAL #2   Title  Patient will complete sit <> stand transfer chair to chair with Min A and LRAD to improve functional independence for household and community mobility.     Baseline  max A +2 STS at W/C (06/12/2018); max A +2-3 STS or stand pivot transfer, able to complete more reps (06/19/2018); 07/08/18: not safe to attempt at this time; 09/11/18: requires maxA+1 or modA+2 for STSs and squat pivot transfer; 10/23/18: requires maxA+1 or modA+2 for STSs and squat pivot transfer    Time  12    Period  Weeks    Status  On-going    Target Date  12/04/18      PT LONG TERM GOAL #3   Title  Patient will navigate power w/c with min A x 100 feet to improve mobility for household and short community distances.    Baseline  required total A (06/12/2018); able to roll 20 feet with min A (06/20/2018); 07/08/18: Pt can go approximately 10' without assist per family;  10/23/2018: pt's daughter states that his power WC mobility has become 100% better with the new hand control, slowed speed, and with practice    Time  12    Period  Weeks    Status  Achieved    Target Date  10/23/18      PT LONG TERM GOAL #4   Title  Patient will complete car transfers  W/C to family SUV with min A using LRAD to improve his abilty to participate in community activities.     Baseline  unable to complete car transfers at all (06/12/2018); unable to complete car transfers at all (06/19/2018).; 07/08/18: Unable to perform at this time; 09/11/18: unable to perform at this time; 10/23/2018: unable to complete car transfers at all    Time  12    Period  Weeks    Status  On-going    Target Date  12/04/18      PT LONG TERM GOAL #5   Title  Patient will ambulate at least 150 feet with mod I using LRAD to improve mobility for household and community distances.     Baseline  unable to take steps (06/12/2018); able to shift R leg forward and back with max A and RW at edge of plinth (06/19/2018); 07/08/18: unable to ambulate at this time. 09/11/18: unable to perform at this time; 10/23/2018: unable to perform at this time    Time  12    Period  Weeks    Status  On-going    Target Date  12/04/18      PT LONG TERM GOAL #6   Title  Patient will navigate 4 steps with min A and BUE support to improve his ability to access community and social participation.     Baseline  unable to complet stairs (06/12/2018); unable to complete stairs (06/19/2018); 07/08/18: unable to attempt at this time; 09/11/18: unsafe to attempt at this time; 10/23/2018: unable to complet stairs (06/12/2018); unable to complete stairs (06/19/2018); 07/08/18: unable to attempt at this time; 09/11/18: unsafe to attempt at this time; 10/23/18: unsafe to attempt at this time    Time  12    Period  Weeks    Status  On-going    Target Date  12/04/18      PT LONG TERM GOAL #7   Title  Patient will increase functional reach test to >15 inches in sitting to exhibit improved sitting balance and positioning and reduce fall risk;    Baseline  07/30/18: 12 inches; 09/11/18: 10-12 inches; 10/23/18: 12-13 inch    Time  4    Period  Weeks    Status  Partially Met    Target Date  12/04/18            Plan - 10/31/18 1618    Clinical Impression  Statement  Pt demonstrated excellent motivation throughout today's session.  He demonstrates improvements with L hip flexion and L LAQ, being able to achieve ~50% of ROM actively.  Extensive caregiver training was performed today to reinforce and practice proper body mechanics and techniques in order to experience success at home.  Patient/caregiver would benefit from additional education in proper positioning for sit<>Stand transfers at home to improve transfer ability and functional strength.  Pt will benefit from continued skilled PT services in order to increase strength and functional mobility in order to decrease caregiver burden, improve his ability to perform ADLs, and to improve his QOL.    Personal Factors and Comorbidities  Age;Comorbidity 3+    Comorbidities  Relevant past medical history and comorbidities include long term steroid use for lupus, CKD, chronic osteomyelitis, cervical spine stenosis, BPH, APS, alcohol abuse, peripheral artery disease, depression, GERD, obstructive sleep apnea, HTN, IBS, IgA deficiency, lumbar radiculopathy, RA, Stroke, toxic maculopathy in both eyes, systemic lupus erythematosus related syndrome, cardiac catheterization, carotid endarterectomy, cervical laminectomy, coronary angioplasty, Fusion C5-C7, knee arthroplasty, lumbar surgery.    Examination-Activity Limitations  Bed Mobility;Bend;Sit;Toileting;Stand;Stairs;Lift;Transfers;Squat;Locomotion Level;Carry;Dressing;Hygiene/Grooming;Continence    Examination-Participation Restrictions  Yard Work;Interpersonal Relationship;Community Activity    Stability/Clinical Decision Making  Evolving/Moderate complexity    Rehab Potential  Fair    PT Frequency  3x / week    PT Duration  12 weeks    PT Treatment/Interventions  ADLs/Self Care Home Management;Electrical Stimulation;Moist Heat;Cryotherapy;Gait training;Stair training;Functional mobility training;Therapeutic exercise;Balance training;Neuromuscular  re-education;Cognitive remediation;Patient/family education;Orthotic Fit/Training;Wheelchair mobility training;Manual techniques;Passive range of motion;Energy conservation;Joint Manipulations    PT Next Visit Plan  Work on strength, sitting/standing balance, functional activities such as rolling, bed mobility, transfers; caregiver training    PT Home Exercise Plan  handout provided via Beth during previous sessions    Consulted and Agree with Plan of Care  Patient;Family member/caregiver    Family Member Consulted  daughter [name]       Patient will benefit from skilled therapeutic intervention in order to improve the following deficits and impairments:  Abnormal gait, Decreased activity tolerance, Decreased cognition, Decreased endurance, Decreased knowledge of use of DME, Decreased range of motion, Decreased skin integrity, Decreased strength, Impaired perceived functional ability, Impaired sensation, Impaired UE functional use, Improper body mechanics, Pain, Cardiopulmonary status limiting activity, Decreased balance, Decreased coordination, Decreased mobility, Difficulty walking, Impaired tone, Postural dysfunction  Visit Diagnosis: Muscle weakness (generalized)  Difficulty in walking, not elsewhere classified  Other lack of coordination     Problem List Patient Active Problem List   Diagnosis Date Noted  . Sepsis ([place]) 10/18/2018     This entire session was performed under direct supervision and direction of a licensed therapist/therapist assistant . I have personally read, edited and approve of the note as written.   Lutricia Horsfall, SPT Phillips Grout PT, DPT, GCS  Huprich,Jason 11/01/2018, 10:11 AM  [place] MAIN [facility] SERVICES [address] [place], [place], [address] Phone: [phone]   Fax:  [phone]  Name:  Kenneth Summers MRN: [id number] Date of Birth: 20-Mar-1945

## 2018-11-04 ENCOUNTER — Encounter: Payer: Self-pay | Admitting: Physical Therapy

## 2018-11-04 ENCOUNTER — Ambulatory Visit: Payer: Medicare Other | Admitting: Occupational Therapy

## 2018-11-04 ENCOUNTER — Ambulatory Visit: Payer: Medicare Other | Admitting: Physical Therapy

## 2018-11-04 ENCOUNTER — Encounter: Payer: Self-pay | Admitting: Occupational Therapy

## 2018-11-04 ENCOUNTER — Other Ambulatory Visit: Payer: Self-pay

## 2018-11-04 DIAGNOSIS — M6281 Muscle weakness (generalized): Secondary | ICD-10-CM

## 2018-11-04 DIAGNOSIS — R278 Other lack of coordination: Secondary | ICD-10-CM

## 2018-11-04 DIAGNOSIS — R262 Difficulty in walking, not elsewhere classified: Secondary | ICD-10-CM

## 2018-11-04 DIAGNOSIS — R296 Repeated falls: Secondary | ICD-10-CM

## 2018-11-04 NOTE — Therapy (Addendum)
[place] MAIN [name] SERVICES [address] [place], [place], [address] Phone: [phone]   Fax:  [phone]  Occupational Therapy Treatment  Patient Details  Name: Kenneth Summers MRN: [id number] Date of Birth: 03/07/1945 No data recorded  Encounter Date: 11/04/2018  OT End of Session - 11/04/18 1715    Visit Number  53    Number of Visits  7   Date for OT Re-Evaluation  01/15/19    Authorization Type  Progress report period starting 10/30/2018    OT Start Time  1352    OT Stop Time  1430    OT Time Calculation (min)  38 min    Activity Tolerance  Patient tolerated treatment well    Behavior During Therapy  [facility] for tasks assessed/performed       Past Medical History:  Diagnosis Date  . Alcohol abuse   . APS (antiphospholipid syndrome) ([place])   . BPH (benign prostatic hyperplasia)   . CAD (coronary artery disease)   . Cellulitis   . Cervical spinal stenosis   . Chronic osteomyelitis ([place])   . CKD (chronic kidney disease)   . CKD (chronic kidney disease)   . Clostridium difficile diarrhea   . Depression   . Diplopia   . Fatigue   . GERD (gastroesophageal reflux disease)   . Hyperlipemia   . Hypertension   . Hypovitaminosis D   . IBS (irritable bowel syndrome)   . 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 Insomnia   . Left lumbosacral radiculopathy   . Moderate obstructive sleep apnea   . Osteomyelitis of foot ([place])   . PAD (peripheral artery disease) ([place])   . Pruritus   . RA (rheumatoid arthritis) ([place])   . Radiculopathy   . Restless legs syndrome   . RLS (restless legs syndrome)   . SLE (systemic lupus erythematosus related syndrome) ([place])   . Stroke ([place])   . Toxic maculopathy of both eyes     Past Surgical History:  Procedure Laterality Date  . CARDIAC CATHETERIZATION    . CAROTID ENDARTERECTOMY    . CERVICAL LAMINECTOMY    . CORONARY ANGIOPLASTY    . FRACTURE SURGERY    . fusion C5-6-7    . HERNIA REPAIR    . KNEE  ARTHROSCOPY    . TONSILLECTOMY      There were no vitals filed for this visit.  Subjective Assessment - 11/04/18 1714    Subjective   Pt. reports feeling better    Patient is accompanied by:  Family member    Pertinent History  Pt. is a 73 y.o. 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 Pt. PMHx includes: Multiple Falls, Lupus, DJD, Renal Abscess, CVA. Pt. resides with his wife. Pt.'s wife and daughter assist with ADLs. Pt. has caregivers in  for 2 hours a day, 6 days a week. Pt. received Rehab services in acute care, at SNF for STR, and [facility] services. Pt. is retired from [employer] for [employer] and [employer].    Currently in Pain?  No/denies      OT TREATMENT    Neuro muscular re-education:   Pt. performed LUE reaching, and [facility] skills grasping 1" cubes with his thumb, and 2nd digit. Pt. worked on engaging his core, and trunk when flexing forward for the cubes placed at a tabletop.  Selfcare:  Education was provided to the patient, and his daughter about his daily ADL routine.  Assist was provided in problem solving through posterior hygiene care at bed level, positioning, and LE dressing.  Response to Treatment:  Pt. reported being tire this afternoon follwoing PT. Pt. is making progress with left sided awareness requiring fewer cues to engage the LUE. Pt. is improving with motor control, and [facility] skills. Pt. continues to present with limited strength,  motor control, and [facility] skills. Pt. continues to work on improving UE functioning for improved engagement in ADLs, and IADL tasks, and maximize independence.                          OT Education - 11/04/18 1715    Education Details  LUE functioning, [place], trunk control    Person(s) Educated  Patient    Methods  Explanation;Demonstration    Comprehension  Verbalized understanding;Returned demonstration;Verbal cues required          OT Long Term Goals - 10/28/18 1532       OT LONG TERM GOAL #1   Title  Pt. will increase left shoulder flexion AROM by 10 degrees to access cabinet/shelf.    Baseline  Pt. has difficulty reaching for items with the LUE secondary to decreased LUE ROM    Time  12    Period  Weeks    Status  On-going    Target Date  01/15/19      OT LONG TERM GOAL #2   Title  Pt. will donn a shirt with Supervision.    Baseline  Min    Time  12    Period  Weeks    Status  On-going    Target Date  01/15/19      OT LONG TERM GOAL #3   Title  Pt. will require ModA to perform LE dressing    Baseline  MaxA left, ModA with the right    Time  12    Period  Weeks    Status  On-going    Target Date  01/15/19      OT LONG TERM GOAL #4   Title  Pt. will improve left grip strength by 10# to be able to open a jar/container.    Baseline  Decreased grip since hospital readmission, pt. has difficulty opening containers    Time  12    Period  Weeks    Status  On-going    Target Date  01/15/19      OT LONG TERM GOAL #5   Title  Pt. will improve left hand [facility] skills to be able to assit with buttoning/zipping.    Baseline  Pt. has difficulty [facility] skills buttoning/zipping    Time  12    Period  Weeks    Status  On-going    Target Date  01/15/19      OT LONG TERM GOAL #6   Title  Pt. will demonstrate visual conmpensatory strategies during 100% of the time during ADLs    Baseline  Pt. requires cues for left sides awareness.    Time  12    Period  Weeks    Status  On-going    Target Date  01/15/19      OT LONG TERM GOAL #7   Title  Pt. will prepare a simple cold snack from w/c with supervision using cognitive compensatory strategies 100% of the time.    Baseline  Pt. is unable    Time  12    Period  Weeks  Target Date  11/27/18      OT LONG TERM GOAL #8   Title  Pt. will accurately identify potential safety hazard using good safety awareness, and judgement 100% for ADLs, and IADLs.    Baseline  Limited    Time  12    Period  Weeks     Status  On-going    Target Date  11/27/18      OT LONG TERM GOAL  #9   TITLE  Pt. will  navigate his w/c around obstacles with Supervision and 100% accuracy    Baseline  Pt. requires cuing for obstacle on the left    Time  12    Period  Weeks    Status  On-going    Target Date  11/27/18            Plan - 11/04/18 1715    Clinical Impression Statement  Pt. reported being tire this afternoon follwoing PT. Pt. is making progress with left sided awareness requiring fewer cues to engage the LUE. Pt. is improving with motor control, and [facility] skills. Pt. continues to present with limited strength,  motor control, and [facility] skills. Pt. continues to work on improving UE functioning for improved engagement in ADLs, and IADL tasks, and maximize independence.    OT Occupational Profile and History  Problem Focused Assessment - Including review of records relating to presenting problem    Occupational performance deficits (Please refer to evaluation for details):  ADL's    Body Structure / Function / Physical Skills  UE functional use;Coordination;FMC;Dexterity;Strength;ROM    Cognitive Skills  Attention;Memory;Emotional;Problem Solve;Safety Awareness    Rehab Potential  Good    Clinical Decision Making  Several treatment options, min-mod task modification necessary    Comorbidities Affecting Occupational Performance:  Presence of comorbidities impacting occupational performance    Comorbidities impacting occupational performance description:  Phyical, cognitive, visual,  medical comorbidities    Modification or Assistance to Complete Evaluation   No modification of tasks or assist necessary to complete eval    OT Frequency  3x / week    OT Duration  12 weeks    OT Treatment/Interventions  Self-care/ADL training;DME and/or AE instruction;Therapeutic exercise;Therapeutic activities;Moist Heat;Cognitive remediation/compensation;Neuromuscular education;Visual/perceptual remediation/compensation;Coping  strategies training;Patient/family education;Passive range of motion;Psychosocial skills training;Energy conservation;Functional Mobility Training    Recommended Other Services  PT    Consulted and Agree with Plan of Care  Patient    Family Member Consulted  Daughter [name]       Patient will benefit from skilled therapeutic intervention in order to improve the following deficits and impairments:   Body Structure / Function / Physical Skills: UE functional use, Coordination, FMC, Dexterity, Strength, ROM Cognitive Skills: Attention, Memory, Emotional, Problem Solve, Safety Awareness     Visit Diagnosis: Muscle weakness (generalized)  Other lack of coordination    Problem List Patient Active Problem List   Diagnosis Date Noted  . Sepsis ([place]) 10/18/2018    Harrel Carina, MS, OTR/L 11/04/2018, 5:25 PM  [place] MAIN [facility] SERVICES [address] [place], [place], [address] Phone: [phone]   Fax:  [phone]  Name: SAHAR PRIMUS MRN: [id number] Date of Birth: Oct 26, 1945

## 2018-11-04 NOTE — Therapy (Signed)
[place] MAIN [facility] SERVICES [address] [place], [place], [address] Phone: [phone]   Fax:  [phone]  Physical Therapy Treatment  Patient Details  Name: Kenneth Summers MRN: [id number] Date of Birth: Aug 19, 1945 Referring Provider (PT): Dr. [name], Guy Begin   Encounter Date: 11/04/2018  PT End of Session - 11/04/18 1402    Visit Number  55    Number of Visits  36    Date for PT Re-Evaluation  12/04/18    Authorization Type  Medicare reporting period starting 09/11/18    Authorization Time Period  Current cert period 0/[id number]/[id number]    PT Start Time  1302    PT Stop Time  1345    PT Time Calculation (min)  43 min    Equipment Utilized During Treatment  Gait belt    Activity Tolerance  Patient tolerated treatment well    Behavior During Therapy  [facility] for tasks assessed/performed       Past Medical History:  Diagnosis Date  . Alcohol abuse   . APS (antiphospholipid syndrome) ([place])   . BPH (benign prostatic hyperplasia)   . CAD (coronary artery disease)   . Cellulitis   . Cervical spinal stenosis   . Chronic osteomyelitis ([place])   . CKD (chronic kidney disease)   . CKD (chronic kidney disease)   . Clostridium difficile diarrhea   . Depression   . Diplopia   . Fatigue   . GERD (gastroesophageal reflux disease)   . Hyperlipemia   . Hypertension   . Hypovitaminosis D   . IBS (irritable bowel syndrome)   . IgA deficiency ([place])   . Insomnia   . Left lumbosacral radiculopathy   . Moderate obstructive sleep apnea   . Osteomyelitis of foot ([place])   . PAD (peripheral artery disease) ([place])   . Pruritus   . RA (rheumatoid arthritis) ([place])   . Radiculopathy   . Restless legs syndrome   . RLS (restless legs syndrome)   . SLE (systemic lupus erythematosus related syndrome) ([place])   . Stroke ([place])   . Toxic maculopathy of both eyes     Past Surgical History:  Procedure Laterality Date  . CARDIAC CATHETERIZATION    .  CAROTID ENDARTERECTOMY    . CERVICAL LAMINECTOMY    . CORONARY ANGIOPLASTY    . FRACTURE SURGERY    . fusion C5-6-7    . HERNIA REPAIR    . KNEE ARTHROSCOPY    . TONSILLECTOMY      There were no vitals filed for this visit.  Subjective Assessment - 11/04/18 1401    Subjective  Patient reports increased fatigue and depression today. He reports having a follow up with PCP who recommended he work on imporving electrolytes to help with fatigue; He reports adherence with HEP but denies working on standing over the weekend.    Pertinent History  Patient is a 73 y.o. male who presents to outpatient physical therapy with a referral for medical diagnosis of CVA. This patient's chief complaints consist of left hemiplegia and overall deconditioning leading to the following functional deficits: dependent for ADLs, IADLs, unable to transfer to car, dependent transfers at home with hoyer lift, unable to walk or stand without significant assistance, difficulty with W/C navigation..    Limitations  Sitting;Lifting;Standing;House hold activities;Writing;Walking;Other (comment)    Diagnostic tests  Brain MRI 11/01/2017: IMPRESSION: 1. Acute right ACA territory infarct as demonstrated on prior CT imaging. No intracranial hemorrhage or significant mass effect.  2. 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    Currently in Pain?  No/denies    Multiple Pain Sites  No       TREATMENT:  Attempted sit<>Stand transfer from power chair with therapist assistance x2 reps with therapist in front, x2 reps with therapist to each side but patient unable to transfer to full stand. Patient able to lean forward with cues but when trying to stand often falls backwards with heavy posterior lean.  He required mod VCs for proper hand placement and trunk position. When sitting forward in chair unsupported patient leans to left side away from midline;  Utilized mirror and verbal cues to re-orient to  midline;  PT instructed patient in multiple exercise to facilitate better transfer ability: Sitting in power chair:  Anterior/posterior pelvic rocks x5 reps;  Forward/backward lean with therapist assist x5 reps x2 sets with cues to re-orient to midline;   Despite multiple attempts patient unable to fully stand without mod-max A +2  Instructed patient in squat pivot transfer to mat table mod A +2 with mod VCs for hand placement/trunk position; Patient required increased time to position self prior to transfer for better transfer ability; He exhibits decreased trunk control with heavy posterior lean;  Patient transitioned sit to hooklying with max A +2 with poor trunk control and decreased UE/LE positioning; He required assistance to lift legs onto mat table and to position self better;  Patient in hooklying: Lumbar trunk rotation x5 reps each direction with cues to increase ROM for better stretch; Progressed to rolling side to side stopping at midline x2 reps each direction with mod VCs for proper hand placement and LE positioning;  Following rolling instructed patient to transition sidelying to sitting mod A +2 with increased time required;  Patient able to sit edge of mat table with erect posture with less lateral trunk lean with mirror for visual feedback;  Instructed patient in sitting with eyes closed to "find midline" and sit with erect posture. Patient able to correctly identify midline, indicating left posterior lean is likely related to fatigue and not poor positional awareness;  Instructed patient in stand pivot transfer with mod VCs for hand/foot placement mat table to power chair with mod A +2. He was able to stand up fully erect but had significant difficulty pivoting to power chair requiring increased assistance for hip control;  Patient reports increased fatigue at end of session; He was able to scoot back in power chair with min A and mod VCs for positioning;                          PT Education - 11/04/18 1402    Education Details  transfers/positioning;    Person(s) Educated  Patient;Child(ren)    Methods  Explanation;Verbal cues    Comprehension  Verbalized understanding;Returned demonstration;Verbal cues required;Need further instruction       PT Short Term Goals - 10/23/18 1314      PT SHORT TERM GOAL #1   Title  Be independent with initial home exercise program for self-management of symptoms.    Baseline  HEP to be given at second session; advice to practice unsupported sitting at home (06/19/2018); 07/08/18: Performing at home, 6/29: needs assistance but is doing exercise program regularly; 08/22/18: adherent;    Time  2    Period  Weeks    Status  Achieved    Target Date  06/26/18  PT Long Term Goals - 10/23/18 1315      PT LONG TERM GOAL #1   Title  Patient will complete all bed mobility with min A to improve functional independence for getting in and out of bed and adjusting in bed.     Baseline  max A - total A reported by family (06/19/2018); 07/08/18: still requires maxA, dtr reports improvement in rolling since starting therapy; 6/23: min A to supervision in clinic; not doing at home right; 8/5: Pt's daughter states that his rolling has improved, but still has difficulty with all other bed mobility; 10/23/18: pts daughter states that she is doing about 35% of the work if he is in proper position, he does uses the bed rails to help roll;  using Hoyer for coming to sit EOB    Time  12    Period  Weeks    Status  Partially Met    Target Date  12/04/18      PT LONG TERM GOAL #2   Title  Patient will complete sit <> stand transfer chair to chair with Min A and LRAD to improve functional independence for household and community mobility.     Baseline  max A +2 STS at W/C (06/12/2018); max A +2-3 STS or stand pivot transfer, able to complete more reps (06/19/2018); 07/08/18: not safe to attempt at this time;  09/11/18: requires maxA+1 or modA+2 for STSs and squat pivot transfer; 10/23/18: requires maxA+1 or modA+2 for STSs and squat pivot transfer    Time  12    Period  Weeks    Status  On-going    Target Date  12/04/18      PT LONG TERM GOAL #3   Title  Patient will navigate power w/c with min A x 100 feet to improve mobility for household and short community distances.    Baseline  required total A (06/12/2018); able to roll 20 feet with min A (06/20/2018); 07/08/18: Pt can go approximately 10' without assist per family;  10/23/2018: pt's daughter states that his power WC mobility has become 100% better with the new hand control, slowed speed, and with practice    Time  12    Period  Weeks    Status  Achieved    Target Date  10/23/18      PT LONG TERM GOAL #4   Title  Patient will complete car transfers W/C to family SUV with min A using LRAD to improve his abilty to participate in community activities.     Baseline  unable to complete car transfers at all (06/12/2018); unable to complete car transfers at all (06/19/2018).; 07/08/18: Unable to perform at this time; 09/11/18: unable to perform at this time; 10/23/2018: unable to complete car transfers at all    Time  12    Period  Weeks    Status  On-going    Target Date  12/04/18      PT LONG TERM GOAL #5   Title  Patient will ambulate at least 150 feet with mod I using LRAD to improve mobility for household and community distances.     Baseline  unable to take steps (06/12/2018); able to shift R leg forward and back with max A and RW at edge of plinth (06/19/2018); 07/08/18: unable to ambulate at this time. 09/11/18: unable to perform at this time; 10/23/2018: unable to perform at this time    Time  12    Period  Weeks  Status  On-going    Target Date  12/04/18      PT LONG TERM GOAL #6   Title  Patient will navigate 4 steps with min A and BUE support to improve his ability to access community and social participation.     Baseline  unable to complet stairs  (06/12/2018); unable to complete stairs (06/19/2018); 07/08/18: unable to attempt at this time; 09/11/18: unsafe to attempt at this time; 10/23/2018: unable to complet stairs (06/12/2018); unable to complete stairs (06/19/2018); 07/08/18: unable to attempt at this time; 09/11/18: unsafe to attempt at this time; 10/23/18: unsafe to attempt at this time    Time  12    Period  Weeks    Status  On-going    Target Date  12/04/18      PT LONG TERM GOAL #7   Title  Patient will increase functional reach test to >15 inches in sitting to exhibit improved sitting balance and positioning and reduce fall risk;    Baseline  07/30/18: 12 inches; 09/11/18: 10-12 inches; 10/23/18: 12-13 inch    Time  4    Period  Weeks    Status  Partially Met    Target Date  12/04/18            Plan - 11/04/18 1402    Clinical Impression Statement  Patient heavily fatigued this session requiring increased cues and instruction for proper positioning and task. patient often falling to left side with heavy posterior lean. PT instructed patient in sitting edge of table with eyes closed with instruction to find midline with patient being able to correctly adjust to midline. However with increased time sitting unsupported often falls to left side due to fatigue. Patient has increased difficulty with transfers this session requiring increased assistance and cues for proper positioning; Patient would benefit from additional skilled PT Intervention to improve strength and mobility;    Personal Factors and Comorbidities  Age;Comorbidity 3+    Comorbidities  Relevant past medical history and comorbidities include long term steroid use for lupus, CKD, chronic osteomyelitis, cervical spine stenosis, BPH, APS, alcohol abuse, peripheral artery disease, depression, GERD, obstructive sleep apnea, HTN, IBS, IgA deficiency, lumbar radiculopathy, RA, Stroke, toxic maculopathy in both eyes, systemic lupus erythematosus related syndrome, cardiac catheterization,  carotid endarterectomy, cervical laminectomy, coronary angioplasty, Fusion C5-C7, knee arthroplasty, lumbar surgery.    Examination-Activity Limitations  Bed Mobility;Bend;Sit;Toileting;Stand;Stairs;Lift;Transfers;Squat;Locomotion Level;Carry;Dressing;Hygiene/Grooming;Continence    Examination-Participation Restrictions  Yard Work;Interpersonal Relationship;Community Activity    Stability/Clinical Decision Making  Evolving/Moderate complexity    Rehab Potential  Fair    PT Frequency  3x / week    PT Duration  12 weeks    PT Treatment/Interventions  ADLs/Self Care Home Management;Electrical Stimulation;Moist Heat;Cryotherapy;Gait training;Stair training;Functional mobility training;Therapeutic exercise;Balance training;Neuromuscular re-education;Cognitive remediation;Patient/family education;Orthotic Fit/Training;Wheelchair mobility training;Manual techniques;Passive range of motion;Energy conservation;Joint Manipulations    PT Next Visit Plan  Work on strength, sitting/standing balance, functional activities such as rolling, bed mobility, transfers; caregiver training    PT Home Exercise Plan  handout provided via Beth during previous sessions    Consulted and Agree with Plan of Care  Patient;Family member/caregiver    Family Member Consulted  daughter [name]       Patient will benefit from skilled therapeutic intervention in order to improve the following deficits and impairments:  Abnormal gait, Decreased activity tolerance, Decreased cognition, Decreased endurance, Decreased knowledge of use of DME, Decreased range of motion, Decreased skin integrity, Decreased strength, Impaired perceived functional ability, Impaired sensation, Impaired UE  functional use, Improper body mechanics, Pain, Cardiopulmonary status limiting activity, Decreased balance, Decreased coordination, Decreased mobility, Difficulty walking, Impaired tone, Postural dysfunction  Visit Diagnosis: Muscle weakness  (generalized)  Other lack of coordination  Difficulty in walking, not elsewhere classified  Repeated falls     Problem List Patient Active Problem List   Diagnosis Date Noted  . Sepsis ([place]) 10/18/2018    Jaquanda Wickersham PT, DPT 11/04/2018, 2:07 PM  [place] MAIN [facility] SERVICES [address] [place], [place], [address] Phone: [phone]   Fax:  [phone]  Name: Kenneth Summers MRN: [id number] Date of Birth: 1945/07/12

## 2018-11-06 ENCOUNTER — Ambulatory Visit: Payer: Medicare Other | Admitting: Physical Therapy

## 2018-11-06 ENCOUNTER — Other Ambulatory Visit: Payer: Self-pay

## 2018-11-06 ENCOUNTER — Ambulatory Visit: Payer: Medicare Other | Admitting: Occupational Therapy

## 2018-11-06 ENCOUNTER — Encounter: Payer: Self-pay | Admitting: Physical Therapy

## 2018-11-06 DIAGNOSIS — M6281 Muscle weakness (generalized): Secondary | ICD-10-CM

## 2018-11-06 DIAGNOSIS — R262 Difficulty in walking, not elsewhere classified: Secondary | ICD-10-CM

## 2018-11-06 DIAGNOSIS — R296 Repeated falls: Secondary | ICD-10-CM

## 2018-11-06 DIAGNOSIS — R278 Other lack of coordination: Secondary | ICD-10-CM

## 2018-11-06 NOTE — Therapy (Addendum)
[place] MAIN [facility] SERVICES [address] [place], [place], [address] Phone: [phone]   Fax:  [phone]  Occupational Therapy Treatment  Patient Details  Name: Kenneth Summers MRN: [id number] Date of Birth: May 24, 1945 No data recorded  Encounter Date: 11/06/2018  OT End of Session - 11/06/18 1618    Visit Number  47    Number of Visits  65   Date for OT Re-Evaluation  01/15/19    Authorization Type  Progress report period starting 10/30/2018    OT Start Time  1352    OT Stop Time  1430    OT Time Calculation (min)  38 min    Activity Tolerance  Patient tolerated treatment well    Behavior During Therapy  [facility] for tasks assessed/performed       Past Medical History:  Diagnosis Date  . Alcohol abuse   . APS (antiphospholipid syndrome) ([place])   . BPH (benign prostatic hyperplasia)   . CAD (coronary artery disease)   . Cellulitis   . Cervical spinal stenosis   . Chronic osteomyelitis ([place])   . CKD (chronic kidney disease)   . CKD (chronic kidney disease)   . Clostridium difficile diarrhea   . Depression   . Diplopia   . Fatigue   . GERD (gastroesophageal reflux disease)   . Hyperlipemia   . Hypertension   . Hypovitaminosis D   . IBS (irritable bowel syndrome)   . 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 Insomnia   . Left lumbosacral radiculopathy   . Moderate obstructive sleep apnea   . Osteomyelitis of foot ([place])   . PAD (peripheral artery disease) ([place])   . Pruritus   . RA (rheumatoid arthritis) ([place])   . Radiculopathy   . Restless legs syndrome   . RLS (restless legs syndrome)   . SLE (systemic lupus erythematosus related syndrome) ([place])   . Stroke ([place])   . Toxic maculopathy of both eyes     Past Surgical History:  Procedure Laterality Date  . CARDIAC CATHETERIZATION    . CAROTID ENDARTERECTOMY    . CERVICAL LAMINECTOMY    . CORONARY ANGIOPLASTY    . FRACTURE SURGERY    . fusion C5-6-7    . HERNIA REPAIR    . KNEE  ARTHROSCOPY    . TONSILLECTOMY      There were no vitals filed for this visit.  Subjective Assessment - 11/06/18 1618    Patient is accompanied by:  Family member    Pertinent History  Pt. is a 73 y.o. 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 Pt. PMHx includes: Multiple Falls, Lupus, DJD, Renal Abscess, CVA. Pt. resides with his wife. Pt.'s wife and daughter assist with ADLs. Pt. has caregivers in  for 2 hours a day, 6 days a week. Pt. received Rehab services in acute care, at SNF for STR, and [place] services. Pt. is retired from [employer] for [employer] and [employer].    Currently in Pain?  No/denies      OT TREATMENT    Neuro muscular re-education:  Pt. worked on reaching tasks with forward trunk flexion grasping with the left UE for 1" resistive cubes and placing them on the tabletop. Pt. worked on reaching while crossing midline with the left to the far right, as well as to the far left. Pt. continues to work reaching, and sequencing to place the cubes back onto the board. Pt. required  multiple rest breaks, and cues for righting himself back to midline.  Response to Treatment  Pt. is making steady progress. Pt. is now practicing posterior hygiene care at home. Education was provided about positioning, and adding the use of pillow, or wedge supports when in sidelying to prevent rolling all the way to his back when reaching posteriorly with his RUE. Pt. education was provided about adaptive velcro LE clothing, and underclothing.  Pt. was able to identifiy when he was distracted, and lost his attention during reaching tasks. Pt. was able to identifiy it, and was able to redirect back to perform the task without cuing. Pt. continues to work on improving UE strength, trunk control, and [place] skills during ADLs, and IADL tasks.                      OT Education - 11/06/18 1618    Education Details  LUE functioning, [place], trunk control     Person(s) Educated  Patient    Methods  Explanation;Demonstration    Comprehension  Verbalized understanding;Returned demonstration;Verbal cues required          OT Long Term Goals - 10/28/18 1532      OT LONG TERM GOAL #1   Title  Pt. will increase left shoulder flexion AROM by 10 degrees to access cabinet/shelf.    Baseline  Pt. has difficulty reaching for items with the LUE secondary to decreased LUE ROM    Time  12    Period  Weeks    Status  On-going    Target Date  01/15/19      OT LONG TERM GOAL #2   Title  Pt. will donn a shirt with Supervision.    Baseline  Min    Time  12    Period  Weeks    Status  On-going    Target Date  01/15/19      OT LONG TERM GOAL #3   Title  Pt. will require ModA to perform LE dressing    Baseline  MaxA left, ModA with the right    Time  12    Period  Weeks    Status  On-going    Target Date  01/15/19      OT LONG TERM GOAL #4   Title  Pt. will improve left grip strength by 10# to be able to open a jar/container.    Baseline  Decreased grip since hospital readmission, pt. has difficulty opening containers    Time  12    Period  Weeks    Status  On-going    Target Date  01/15/19      OT LONG TERM GOAL #5   Title  Pt. will improve left hand [facility] skills to be able to assit with buttoning/zipping.    Baseline  Pt. has difficulty [facility] skills buttoning/zipping    Time  12    Period  Weeks    Status  On-going    Target Date  01/15/19      OT LONG TERM GOAL #6   Title  Pt. will demonstrate visual conmpensatory strategies during 100% of the time during ADLs    Baseline  Pt. requires cues for left sides awareness.    Time  12    Period  Weeks    Status  On-going    Target Date  01/15/19      OT LONG TERM GOAL #7   Title  Pt. will prepare a simple  cold snack from w/c with supervision using cognitive compensatory strategies 100% of the time.    Baseline  Pt. is unable    Time  12    Period  Weeks    Target Date  11/27/18       OT LONG TERM GOAL #8   Title  Pt. will accurately identify potential safety hazard using good safety awareness, and judgement 100% for ADLs, and IADLs.    Baseline  Limited    Time  12    Period  Weeks    Status  On-going    Target Date  11/27/18      OT LONG TERM GOAL  #9   TITLE  Pt. will  navigate his w/c around obstacles with Supervision and 100% accuracy    Baseline  Pt. requires cuing for obstacle on the left    Time  12    Period  Weeks    Status  On-going    Target Date  11/27/18            Plan - 11/06/18 1619    Clinical Impression Statement  Pt. is making steady progress. Pt. is now practicing posterior hygiene care at home. Education was provided about positioning, and adding the use of pillow, or wedge supports when in sidelying to prevent rolling all the way to his back when reaching posteriorly with his RUE. Pt. was able to identifiy when he was distracted, and lost his attention during reaching tasks. Pt. was able to identifiy it, and was able to redirect back to perform the task without cuing. Pt. continues to work on improving UE strength, trunk control, and [place] skills during ADLs, and IADL tasks.    OT Occupational Profile and History  Problem Focused Assessment - Including review of records relating to presenting problem    Occupational performance deficits (Please refer to evaluation for details):  ADL's    Body Structure / Function / Physical Skills  UE functional use;Coordination;FMC;Dexterity;Strength;ROM    Cognitive Skills  Attention;Memory;Emotional;Problem Solve;Safety Awareness    Rehab Potential  Good    Clinical Decision Making  Several treatment options, min-mod task modification necessary    Comorbidities Affecting Occupational Performance:  Presence of comorbidities impacting occupational performance    Comorbidities impacting occupational performance description:  Phyical, cognitive, visual,  medical comorbidities    Modification or Assistance to  Complete Evaluation   No modification of tasks or assist necessary to complete eval    OT Frequency  3x / week    OT Duration  12 weeks    OT Treatment/Interventions  Self-care/ADL training;DME and/or AE instruction;Therapeutic exercise;Therapeutic activities;Moist Heat;Cognitive remediation/compensation;Neuromuscular education;Visual/perceptual remediation/compensation;Coping strategies training;Patient/family education;Passive range of motion;Psychosocial skills training;Energy conservation;Functional Mobility Training    Consulted and Agree with Plan of Care  Patient       Patient will benefit from skilled therapeutic intervention in order to improve the following deficits and impairments:   Body Structure / Function / Physical Skills: UE functional use, Coordination, FMC, Dexterity, Strength, ROM Cognitive Skills: Attention, Memory, Emotional, Problem Solve, Safety Awareness     Visit Diagnosis: Muscle weakness (generalized)  Other lack of coordination    Problem List Patient Active Problem List   Diagnosis Date Noted  . Sepsis ([place]) 10/18/2018    Harrel Carina, MS, OTR/L 11/06/2018, 4:32 PM  [place] MAIN [facility] SERVICES [address] [place], [place], [address] Phone: [phone]   Fax:  [phone]  Name: Kenneth Summers MRN: [id number]  Date of Birth: 1945/07/19

## 2018-11-06 NOTE — Therapy (Signed)
[place] MAIN [facility] SERVICES [address] [place], [place], [address] Phone: [phone]   Fax:  [phone]  Physical Therapy Treatment  Patient Details  Name: Kenneth Summers MRN: [id number] Date of Birth: 06/02/1945 Referring Provider (PT): Dr. [name], Guy Begin   Encounter Date: 11/06/2018  PT End of Session - 11/06/18 1257    Visit Number  27    Number of Visits  79    Date for PT Re-Evaluation  12/04/18    Authorization Type  Medicare reporting period starting 09/11/18    Authorization Time Period  Current cert period 07/15/6293-28/41/32    PT Start Time  1301    PT Stop Time  1345    PT Time Calculation (min)  44 min    Equipment Utilized During Treatment  Gait belt    Activity Tolerance  Patient tolerated treatment well    Behavior During Therapy  [facility] for tasks assessed/performed       Past Medical History:  Diagnosis Date  . Alcohol abuse   . APS (antiphospholipid syndrome) ([place])   . BPH (benign prostatic hyperplasia)   . CAD (coronary artery disease)   . Cellulitis   . Cervical spinal stenosis   . Chronic osteomyelitis ([place])   . CKD (chronic kidney disease)   . CKD (chronic kidney disease)   . Clostridium difficile diarrhea   . Depression   . Diplopia   . Fatigue   . GERD (gastroesophageal reflux disease)   . Hyperlipemia   . Hypertension   . Hypovitaminosis D   . IBS (irritable bowel syndrome)   . IgA deficiency ([place])   . Insomnia   . Left lumbosacral radiculopathy   . Moderate obstructive sleep apnea   . Osteomyelitis of foot ([place])   . PAD (peripheral artery disease) ([place])   . Pruritus   . RA (rheumatoid arthritis) ([place])   . Radiculopathy   . Restless legs syndrome   . RLS (restless legs syndrome)   . SLE (systemic lupus erythematosus related syndrome) ([place])   . Stroke ([place])   . Toxic maculopathy of both eyes     Past Surgical History:  Procedure Laterality Date  . CARDIAC CATHETERIZATION    .  CAROTID ENDARTERECTOMY    . CERVICAL LAMINECTOMY    . CORONARY ANGIOPLASTY    . FRACTURE SURGERY    . fusion C5-6-7    . HERNIA REPAIR    . KNEE ARTHROSCOPY    . TONSILLECTOMY      There were no vitals filed for this visit.  Subjective Assessment - 11/06/18 1549    Subjective  Patient reports feeling good. Denies any soreness and reports minimal fatigue. Daughter reports HEP has been going well.    Pertinent History  Patient is a 73 y.o. male who presents to outpatient physical therapy with a referral for medical diagnosis of CVA. This patient's chief complaints consist of left hemiplegia and overall deconditioning leading to the following functional deficits: dependent for ADLs, IADLs, unable to transfer to car, dependent transfers at home with hoyer lift, unable to walk or stand without significant assistance, difficulty with W/C navigation..    Limitations  Sitting;Lifting;Standing;House hold activities;Writing;Walking;Other (comment)    Diagnostic tests  Brain MRI 11/01/2017: IMPRESSION: 1. Acute right ACA territory infarct as demonstrated on prior CT imaging. No intracranial hemorrhage or significant mass effect. 2. 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    Currently in Pain?  No/denies  TREATMENT:  Patient expressed desire to improve transfer ability at home. He and caregiver states that they have been unable to stand over last week due to patient being unable to successful transfer from power chair  PT instructed patient in sit<>Stand transfers with mod VCs for proper positioning including forward weight shift, hand placement and adjusting power chair for better ease of transfer. He exhibits increased left lateral lean this session requiring verbal , tactile and visual cues to correct to midline Sit<>Stand with therapist in front of patient blocking knees x2 reps with mod A +2 with cues for forward weight shift and proper trunk control;   Patient able to stand for approximately 30 sec, 20 sec respectively exhibiting increased posterior trunk lean;   Instructed patient in forward weight shifts with hip rocks x5 reps x2 sets with patient lifting hips off of power chair. Patient/caregiver verbalized understanding;  Progressed to patient's daughter standing in front of patient, blocking knees, rocking with patient x5 reps with patient standing on 5th attempt with mod A +2; patient reports increased knee discomfort due to daughter's positioning; Repositioned with pillow placed between daughter and patient for better comfort; Rocked forward x5 reps with standing on 5th rep with patient being able to hold standing for approximately 30 sec with daughter assistance (min-mod A +1) with cues for proper weight shift and erect posture;  Patient and caregiver able to exhibit good positioning and safety with transfer. They verbalized understanding of importance of rocking prior to transfer for better transfer ease;  Patient heavily fatigued at end of session. He was able to reposition self in power chair with min A +2 for better ease.                        PT Education - 11/06/18 1257    Education Details  transfers/positioning;    Person(s) Educated  Patient;Child(ren)    Methods  Explanation;Demonstration;Verbal cues    Comprehension  Verbalized understanding;Returned demonstration;Verbal cues required;Need further instruction       PT Short Term Goals - 10/23/18 1314      PT SHORT TERM GOAL #1   Title  Be independent with initial home exercise program for self-management of symptoms.    Baseline  HEP to be given at second session; advice to practice unsupported sitting at home (06/19/2018); 07/08/18: Performing at home, 6/29: needs assistance but is doing exercise program regularly; 08/22/18: adherent;    Time  2    Period  Weeks    Status  Achieved    Target Date  06/26/18        PT Long Term Goals - 10/23/18  1315      PT LONG TERM GOAL #1   Title  Patient will complete all bed mobility with min A to improve functional independence for getting in and out of bed and adjusting in bed.     Baseline  max A - total A reported by family (06/19/2018); 07/08/18: still requires maxA, dtr reports improvement in rolling since starting therapy; 6/23: min A to supervision in clinic; not doing at home right; 8/5: Pt's daughter states that his rolling has improved, but still has difficulty with all other bed mobility; 10/23/18: pts daughter states that she is doing about 35% of the work if he is in proper position, he does uses the bed rails to help roll;  using Hoyer for coming to sit EOB    Time  12    Period  Weeks    Status  Partially Met    Target Date  12/04/18      PT LONG TERM GOAL #2   Title  Patient will complete sit <> stand transfer chair to chair with Min A and LRAD to improve functional independence for household and community mobility.     Baseline  max A +2 STS at W/C (06/12/2018); max A +2-3 STS or stand pivot transfer, able to complete more reps (06/19/2018); 07/08/18: not safe to attempt at this time; 09/11/18: requires maxA+1 or modA+2 for STSs and squat pivot transfer; 10/23/18: requires maxA+1 or modA+2 for STSs and squat pivot transfer    Time  12    Period  Weeks    Status  On-going    Target Date  12/04/18      PT LONG TERM GOAL #3   Title  Patient will navigate power w/c with min A x 100 feet to improve mobility for household and short community distances.    Baseline  required total A (06/12/2018); able to roll 20 feet with min A (06/20/2018); 07/08/18: Pt can go approximately 10' without assist per family;  10/23/2018: pt's daughter states that his power WC mobility has become 100% better with the new hand control, slowed speed, and with practice    Time  12    Period  Weeks    Status  Achieved    Target Date  10/23/18      PT LONG TERM GOAL #4   Title  Patient will complete car transfers W/C to  family SUV with min A using LRAD to improve his abilty to participate in community activities.     Baseline  unable to complete car transfers at all (06/12/2018); unable to complete car transfers at all (06/19/2018).; 07/08/18: Unable to perform at this time; 09/11/18: unable to perform at this time; 10/23/2018: unable to complete car transfers at all    Time  12    Period  Weeks    Status  On-going    Target Date  12/04/18      PT LONG TERM GOAL #5   Title  Patient will ambulate at least 150 feet with mod I using LRAD to improve mobility for household and community distances.     Baseline  unable to take steps (06/12/2018); able to shift R leg forward and back with max A and RW at edge of plinth (06/19/2018); 07/08/18: unable to ambulate at this time. 09/11/18: unable to perform at this time; 10/23/2018: unable to perform at this time    Time  12    Period  Weeks    Status  On-going    Target Date  12/04/18      PT LONG TERM GOAL #6   Title  Patient will navigate 4 steps with min A and BUE support to improve his ability to access community and social participation.     Baseline  unable to complet stairs (06/12/2018); unable to complete stairs (06/19/2018); 07/08/18: unable to attempt at this time; 09/11/18: unsafe to attempt at this time; 10/23/2018: unable to complet stairs (06/12/2018); unable to complete stairs (06/19/2018); 07/08/18: unable to attempt at this time; 09/11/18: unsafe to attempt at this time; 10/23/18: unsafe to attempt at this time    Time  12    Period  Weeks    Status  On-going    Target Date  12/04/18      PT LONG TERM GOAL #7   Title  Patient will  increase functional reach test to >15 inches in sitting to exhibit improved sitting balance and positioning and reduce fall risk;    Baseline  07/30/18: 12 inches; 09/11/18: 10-12 inches; 10/23/18: 12-13 inch    Time  4    Period  Weeks    Status  Partially Met    Target Date  12/04/18            Plan - 11/06/18 1549    Clinical Impression  Statement  Patient tolerated session well. He was able to improve forward weight shift and was able to stand with daughter assistance for short period of time. patient and caregiver require mod VCs for proper positioning to improve transfer safety. Patient fatigues with repeated sit to stands requiring intermittent seated rest breaks. Educated patient and caregiver in importance of forward weight shifts for better transfer ability. Patient would benefit from additional skilled PT intervention to improve strength, balance and mobility; Patient does require repeated instruction for proper positioning to improve functional mobility due to poor motor planning from CVA.    Personal Factors and Comorbidities  Age;Comorbidity 3+    Comorbidities  Relevant past medical history and comorbidities include long term steroid use for lupus, CKD, chronic osteomyelitis, cervical spine stenosis, BPH, APS, alcohol abuse, peripheral artery disease, depression, GERD, obstructive sleep apnea, HTN, IBS, IgA deficiency, lumbar radiculopathy, RA, Stroke, toxic maculopathy in both eyes, systemic lupus erythematosus related syndrome, cardiac catheterization, carotid endarterectomy, cervical laminectomy, coronary angioplasty, Fusion C5-C7, knee arthroplasty, lumbar surgery.    Examination-Activity Limitations  Bed Mobility;Bend;Sit;Toileting;Stand;Stairs;Lift;Transfers;Squat;Locomotion Level;Carry;Dressing;Hygiene/Grooming;Continence    Examination-Participation Restrictions  Yard Work;Interpersonal Relationship;Community Activity    Stability/Clinical Decision Making  Evolving/Moderate complexity    Rehab Potential  Fair    PT Frequency  3x / week    PT Duration  12 weeks    PT Treatment/Interventions  ADLs/Self Care Home Management;Electrical Stimulation;Moist Heat;Cryotherapy;Gait training;Stair training;Functional mobility training;Therapeutic exercise;Balance training;Neuromuscular re-education;Cognitive  remediation;Patient/family education;Orthotic Fit/Training;Wheelchair mobility training;Manual techniques;Passive range of motion;Energy conservation;Joint Manipulations    PT Next Visit Plan  Work on strength, sitting/standing balance, functional activities such as rolling, bed mobility, transfers; caregiver training    PT Home Exercise Plan  handout provided via Beth during previous sessions    Consulted and Agree with Plan of Care  Patient;Family member/caregiver    Family Member Consulted  daughter [name]       Patient will benefit from skilled therapeutic intervention in order to improve the following deficits and impairments:  Abnormal gait, Decreased activity tolerance, Decreased cognition, Decreased endurance, Decreased knowledge of use of DME, Decreased range of motion, Decreased skin integrity, Decreased strength, Impaired perceived functional ability, Impaired sensation, Impaired UE functional use, Improper body mechanics, Pain, Cardiopulmonary status limiting activity, Decreased balance, Decreased coordination, Decreased mobility, Difficulty walking, Impaired tone, Postural dysfunction  Visit Diagnosis: Muscle weakness (generalized)  Other lack of coordination  Difficulty in walking, not elsewhere classified  Repeated falls     Problem List Patient Active Problem List   Diagnosis Date Noted  . Sepsis ([place]) 10/18/2018    Trotter,Margaret PT, DPT 11/06/2018, 3:55 PM  [place] MAIN [facility] SERVICES [address] [place], [place], [address] Phone: [phone]   Fax:  [phone]  Name: ANA LIAW MRN: [id number] Date of Birth: 05-05-1945

## 2018-11-07 ENCOUNTER — Ambulatory Visit: Payer: Medicare Other | Admitting: Occupational Therapy

## 2018-11-07 ENCOUNTER — Encounter: Payer: Self-pay | Admitting: Occupational Therapy

## 2018-11-07 ENCOUNTER — Ambulatory Visit: Payer: Medicare Other | Attending: Family Medicine | Admitting: Physical Therapy

## 2018-11-07 ENCOUNTER — Encounter: Payer: Self-pay | Admitting: Physical Therapy

## 2018-11-07 ENCOUNTER — Other Ambulatory Visit: Payer: Self-pay

## 2018-11-07 DIAGNOSIS — M6281 Muscle weakness (generalized): Secondary | ICD-10-CM | POA: Diagnosis not present

## 2018-11-07 DIAGNOSIS — R262 Difficulty in walking, not elsewhere classified: Secondary | ICD-10-CM | POA: Insufficient documentation

## 2018-11-07 DIAGNOSIS — R278 Other lack of coordination: Secondary | ICD-10-CM | POA: Diagnosis present

## 2018-11-07 DIAGNOSIS — R296 Repeated falls: Secondary | ICD-10-CM | POA: Diagnosis present

## 2018-11-07 DIAGNOSIS — I69354 Hemiplegia and hemiparesis following cerebral infarction affecting left non-dominant side: Secondary | ICD-10-CM | POA: Insufficient documentation

## 2018-11-07 NOTE — Therapy (Signed)
[place] MAIN [facility] SERVICES [address] [place], [place], [address] Phone: [phone]   Fax:  [phone]  Physical Therapy Treatment  Patient Details  Name: Kenneth Summers MRN: [id number] Date of Birth: 12-27-1945 Referring Provider (PT): Dr. [name], Guy Begin   Encounter Date: 11/07/2018  PT End of Session - 11/07/18 1544    Visit Number  95    Number of Visits  49    Date for PT Re-Evaluation  12/04/18    Authorization Type  Medicare reporting period starting 09/11/18    Authorization Time Period  Current cert period 10/10/8544-27/03/50    PT Start Time  1301    PT Stop Time  1345    PT Time Calculation (min)  44 min    Equipment Utilized During Treatment  Gait belt    Activity Tolerance  Patient tolerated treatment well    Behavior During Therapy  [facility] for tasks assessed/performed       Past Medical History:  Diagnosis Date  . Alcohol abuse   . APS (antiphospholipid syndrome) ([place])   . BPH (benign prostatic hyperplasia)   . CAD (coronary artery disease)   . Cellulitis   . Cervical spinal stenosis   . Chronic osteomyelitis ([place])   . CKD (chronic kidney disease)   . CKD (chronic kidney disease)   . Clostridium difficile diarrhea   . Depression   . Diplopia   . Fatigue   . GERD (gastroesophageal reflux disease)   . Hyperlipemia   . Hypertension   . Hypovitaminosis D   . IBS (irritable bowel syndrome)   . 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 Insomnia   . Left lumbosacral radiculopathy   . Moderate obstructive sleep apnea   . Osteomyelitis of foot ([place])   . PAD (peripheral artery disease) ([place])   . Pruritus   . RA (rheumatoid arthritis) ([place])   . Radiculopathy   . Restless legs syndrome   . RLS (restless legs syndrome)   . SLE (systemic lupus erythematosus related syndrome) ([place])   . Stroke ([place])   . Toxic maculopathy of both eyes     Past Surgical History:  Procedure Laterality Date  . CARDIAC CATHETERIZATION    .  CAROTID ENDARTERECTOMY    . CERVICAL LAMINECTOMY    . CORONARY ANGIOPLASTY    . FRACTURE SURGERY    . fusion C5-6-7    . HERNIA REPAIR    . KNEE ARTHROSCOPY    . TONSILLECTOMY      There were no vitals filed for this visit.  Subjective Assessment - 11/07/18 1542    Subjective  Patient reports feeling good. Denies any soreness and reports minimal fatigue. Daughter reports HEP has been going well.    Pertinent History  Patient is a 73 y.o. male who presents to outpatient physical therapy with a referral for medical diagnosis of CVA. This patient's chief complaints consist of left hemiplegia and overall deconditioning leading to the following functional deficits: dependent for ADLs, IADLs, unable to transfer to car, dependent transfers at home with hoyer lift, unable to walk or stand without significant assistance, difficulty with W/C navigation..    Limitations  Sitting;Lifting;Standing;House hold activities;Writing;Walking;Other (comment)    Diagnostic tests  Brain MRI 11/01/2017: IMPRESSION: 1. Acute right ACA territory infarct as demonstrated on prior CT imaging. No intracranial hemorrhage or significant mass effect. 2. 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    Currently in Pain?  No/denies  TREATMENT:  Patient expressed desire to improve transfer ability at home. He and caregiver states that they tried standing, but was unable to fully stand this morning. He was able to rock forward better this morning compared to previously, per caregiver.   PT instructed patient in sit<>Stand transfers with mod VCs for proper positioning including forward weight shift, hand placement and adjusting power chair for better ease of transfer. He exhibits increased left lateral lean this session requiring verbal , tactile and visual cues to correct to midline Sit<>Stand with caregiver in front of patient blocking knees x2 reps with mod-min A +1 with cues for  forward weight shift and proper trunk control;  Patient able to stand for approximately 20 sec, 30 sec respectively exhibiting increased posterior trunk lean;   Instructed patient in forward weight shifts with hip rocks x5 reps x2 sets with patient lifting hips off of power chair. Patient/caregiver verbalized understanding;  Instructed patient in forward rock with sit<>Stand to improve transfer ability; He does require cues to improve anterior pelvic rock upon forward weight shift for better transfer ability: pt transferred sit<>Stand with therapist blocking knees min A +1 with mod VCs and tactile/visual cues for proper positioning; Upon standing, instructed patient to increase RLE knee extension and improve erect posture; He often looks down and starts leaning forward with increased fatigue;  Patient and caregiver able to exhibit good positioning and safety with transfer. They verbalized understanding of importance of rocking prior to transfer for better transfer ease;  Exercise: Following transfers, instructed patient in AROM: Seated LAQ x5 x2 sets with improved AROM on LLE Seated hip flexoin march x5 reps bilaterally; Instructed patient in LLE hip flexion with abduction/adduction to simulate getting leg in/out of car or on/off shower chair x5 reps with increased difficulty adducting back to midline;  Patient heavily fatigued at end of session. He was short of breath after each transfer, vitals monitored, HR increased to 90 bpm with repeated transfers; He was able to reposition self in power chair with min A +2 for better ease.                               PT Education - 11/07/18 1544    Education Details  transfers/positioning, exercise, HEP    Person(s) Educated  Patient;Child(ren)    Methods  Explanation;Verbal cues    Comprehension  Verbalized understanding;Returned demonstration;Verbal cues required;Need further instruction       PT Short Term Goals  - 10/23/18 1314      PT SHORT TERM GOAL #1   Title  Be independent with initial home exercise program for self-management of symptoms.    Baseline  HEP to be given at second session; advice to practice unsupported sitting at home (06/19/2018); 07/08/18: Performing at home, 6/29: needs assistance but is doing exercise program regularly; 08/22/18: adherent;    Time  2    Period  Weeks    Status  Achieved    Target Date  06/26/18        PT Long Term Goals - 10/23/18 1315      PT LONG TERM GOAL #1   Title  Patient will complete all bed mobility with min A to improve functional independence for getting in and out of bed and adjusting in bed.     Baseline  max A - total A reported by family (06/19/2018); 07/08/18: still requires maxA, dtr reports improvement in rolling since starting therapy;  6/23: min A to supervision in clinic; not doing at home right; 8/5: Pt's daughter states that his rolling has improved, but still has difficulty with all other bed mobility; 10/23/18: pts daughter states that she is doing about 35% of the work if he is in proper position, he does uses the bed rails to help roll;  using Hoyer for coming to sit EOB    Time  12    Period  Weeks    Status  Partially Met    Target Date  12/04/18      PT LONG TERM GOAL #2   Title  Patient will complete sit <> stand transfer chair to chair with Min A and LRAD to improve functional independence for household and community mobility.     Baseline  max A +2 STS at W/C (06/12/2018); max A +2-3 STS or stand pivot transfer, able to complete more reps (06/19/2018); 07/08/18: not safe to attempt at this time; 09/11/18: requires maxA+1 or modA+2 for STSs and squat pivot transfer; 10/23/18: requires maxA+1 or modA+2 for STSs and squat pivot transfer    Time  12    Period  Weeks    Status  On-going    Target Date  12/04/18      PT LONG TERM GOAL #3   Title  Patient will navigate power w/c with min A x 100 feet to improve mobility for household and short  community distances.    Baseline  required total A (06/12/2018); able to roll 20 feet with min A (06/20/2018); 07/08/18: Pt can go approximately 10' without assist per family;  10/23/2018: pt's daughter states that his power WC mobility has become 100% better with the new hand control, slowed speed, and with practice    Time  12    Period  Weeks    Status  Achieved    Target Date  10/23/18      PT LONG TERM GOAL #4   Title  Patient will complete car transfers W/C to family SUV with min A using LRAD to improve his abilty to participate in community activities.     Baseline  unable to complete car transfers at all (06/12/2018); unable to complete car transfers at all (06/19/2018).; 07/08/18: Unable to perform at this time; 09/11/18: unable to perform at this time; 10/23/2018: unable to complete car transfers at all    Time  12    Period  Weeks    Status  On-going    Target Date  12/04/18      PT LONG TERM GOAL #5   Title  Patient will ambulate at least 150 feet with mod I using LRAD to improve mobility for household and community distances.     Baseline  unable to take steps (06/12/2018); able to shift R leg forward and back with max A and RW at edge of plinth (06/19/2018); 07/08/18: unable to ambulate at this time. 09/11/18: unable to perform at this time; 10/23/2018: unable to perform at this time    Time  12    Period  Weeks    Status  On-going    Target Date  12/04/18      PT LONG TERM GOAL #6   Title  Patient will navigate 4 steps with min A and BUE support to improve his ability to access community and social participation.     Baseline  unable to complet stairs (06/12/2018); unable to complete stairs (06/19/2018); 07/08/18: unable to attempt at this time; 09/11/18: unsafe to  attempt at this time; 10/23/2018: unable to complet stairs (06/12/2018); unable to complete stairs (06/19/2018); 07/08/18: unable to attempt at this time; 09/11/18: unsafe to attempt at this time; 10/23/18: unsafe to attempt at this time    Time  12     Period  Weeks    Status  On-going    Target Date  12/04/18      PT LONG TERM GOAL #7   Title  Patient will increase functional reach test to >15 inches in sitting to exhibit improved sitting balance and positioning and reduce fall risk;    Baseline  07/30/18: 12 inches; 09/11/18: 10-12 inches; 10/23/18: 12-13 inch    Time  4    Period  Weeks    Status  Partially Met    Target Date  12/04/18            Plan - 11/07/18 1544    Clinical Impression Statement  Patient motiviated and participated well within session. He was able to stand for short time with daughters assistance. Patient does require verbal and visual/tactile cues for proper positioning and technique with transfers. Caregiver is able to exhibit good positioning and safety. Patient does fatigue with increased repetitions. Vitals monitored with HR increasing to low 90's with increased repetition. Patient able to exhibit improved LLE AROM following transfers with better mobility. he does require cues for motivation due to increased fatigue. He would benefit from additional skilled PT intervention to improve strength, balance and mobility;    Personal Factors and Comorbidities  Age;Comorbidity 3+    Comorbidities  Relevant past medical history and comorbidities include long term steroid use for lupus, CKD, chronic osteomyelitis, cervical spine stenosis, BPH, APS, alcohol abuse, peripheral artery disease, depression, GERD, obstructive sleep apnea, HTN, IBS, IgA deficiency, lumbar radiculopathy, RA, Stroke, toxic maculopathy in both eyes, systemic lupus erythematosus related syndrome, cardiac catheterization, carotid endarterectomy, cervical laminectomy, coronary angioplasty, Fusion C5-C7, knee arthroplasty, lumbar surgery.    Examination-Activity Limitations  Bed Mobility;Bend;Sit;Toileting;Stand;Stairs;Lift;Transfers;Squat;Locomotion Level;Carry;Dressing;Hygiene/Grooming;Continence    Examination-Participation Restrictions  Yard  Work;Interpersonal Relationship;Community Activity    Stability/Clinical Decision Making  Evolving/Moderate complexity    Rehab Potential  Fair    PT Frequency  3x / week    PT Duration  12 weeks    PT Treatment/Interventions  ADLs/Self Care Home Management;Electrical Stimulation;Moist Heat;Cryotherapy;Gait training;Stair training;Functional mobility training;Therapeutic exercise;Balance training;Neuromuscular re-education;Cognitive remediation;Patient/family education;Orthotic Fit/Training;Wheelchair mobility training;Manual techniques;Passive range of motion;Energy conservation;Joint Manipulations    PT Next Visit Plan  Work on strength, sitting/standing balance, functional activities such as rolling, bed mobility, transfers; caregiver training    PT Home Exercise Plan  handout provided via Beth during previous sessions    Consulted and Agree with Plan of Care  Patient;Family member/caregiver    Family Member Consulted  daughter [name]       Patient will benefit from skilled therapeutic intervention in order to improve the following deficits and impairments:  Abnormal gait, Decreased activity tolerance, Decreased cognition, Decreased endurance, Decreased knowledge of use of DME, Decreased range of motion, Decreased skin integrity, Decreased strength, Impaired perceived functional ability, Impaired sensation, Impaired UE functional use, Improper body mechanics, Pain, Cardiopulmonary status limiting activity, Decreased balance, Decreased coordination, Decreased mobility, Difficulty walking, Impaired tone, Postural dysfunction  Visit Diagnosis: Muscle weakness (generalized)  Other lack of coordination  Difficulty in walking, not elsewhere classified     Problem List Patient Active Problem List   Diagnosis Date Noted  . Sepsis ([place]) 10/18/2018    Liesa Tsan PT, DPT 11/07/2018, 3:47 PM  [place] MAIN [facility] SERVICES [address]  [place], [place], [address] Phone: [phone]   Fax:  [phone]  Name: Kenneth Summers MRN: [id number] Date of Birth: 1945-03-11

## 2018-11-07 NOTE — Therapy (Addendum)
[place] MAIN [facility] SERVICES [address] [place], [place], [address] Phone: [phone]   Fax:  [phone]  Occupational Therapy Treatment  Patient Details  Name: Kenneth Summers MRN: [id number] Date of Birth: 1945/03/30 No data recorded  Encounter Date: 11/07/2018  OT End of Session - 11/07/18 1833    Visit Number  55    Number of Visits  19   Date for OT Re-Evaluation  01/15/19    Authorization Type  Progress report period starting 10/30/2018    OT Start Time  1350    OT Stop Time  1430    OT Time Calculation (min)  40 min    Activity Tolerance  Patient tolerated treatment well    Behavior During Therapy  [facility] for tasks assessed/performed       Past Medical History:  Diagnosis Date  . Alcohol abuse   . APS (antiphospholipid syndrome) ([place])   . BPH (benign prostatic hyperplasia)   . CAD (coronary artery disease)   . Cellulitis   . Cervical spinal stenosis   . Chronic osteomyelitis ([place])   . CKD (chronic kidney disease)   . CKD (chronic kidney disease)   . Clostridium difficile diarrhea   . Depression   . Diplopia   . Fatigue   . GERD (gastroesophageal reflux disease)   . Hyperlipemia   . Hypertension   . Hypovitaminosis D   . IBS (irritable bowel syndrome)   . IgA deficiency ([place])   . Insomnia   . Left lumbosacral radiculopathy   . Moderate obstructive sleep apnea   . Osteomyelitis of foot ([place])   . PAD (peripheral artery disease) ([place])   . Pruritus   . RA (rheumatoid arthritis) ([place])   . Radiculopathy   . Restless legs syndrome   . RLS (restless legs syndrome)   . SLE (systemic lupus erythematosus related syndrome) ([place])   . Stroke ([place])   . Toxic maculopathy of both eyes     Past Surgical History:  Procedure Laterality Date  . CARDIAC CATHETERIZATION    . CAROTID ENDARTERECTOMY    . CERVICAL LAMINECTOMY    . CORONARY ANGIOPLASTY    . FRACTURE SURGERY    . fusion C5-6-7    . HERNIA REPAIR    . KNEE  ARTHROSCOPY    . TONSILLECTOMY      There were no vitals filed for this visit.  Subjective Assessment - 11/07/18 1832    Subjective   Pt. has allergies today.    Patient is accompanied by:  Family member    Pertinent History  Pt. is a 73 y.o. 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 Pt. PMHx includes: Multiple Falls, Lupus, DJD, Renal Abscess, CVA. Pt. resides with his wife. Pt.'s wife and daughter assist with ADLs. Pt. has caregivers in  for 2 hours a day, 6 days a week. Pt. received Rehab services in acute care, at SNF for STR, and [place] services. Pt. is retired from [employer] for [employer] and [employer].    Currently in Pain?  No/denies      OT TREATMENT    Neuro muscular re-education:  Pt. worked on using his left hand for grasping, flipping, turning, and stacking [place] style discs. Pt. required visual demonstration, and cues for movement patterns. Pt. worked on speed, and coordination skills while incorporating forward trunk flexion with reaching for the discs. Pt. worked on reaching and grasping cards with his LUE  with more cuing required today to engage his LUE. Pt. worked on visual scanning tasks to find number cards adding to series of 13. Pt. worked on forward trunk flexion to reach the back row of the cards. Pt. required multiple rest breaks today, and was distracted at times by his allergies/runny nose.  Response to Treatment    Pt. continues to make steady progress. Pt., and his daughter reported that they tried the recommendation of using a wedge at his upper back while attempting to perform posterior hygiene skills from sidelying at home. Pt. reported more success with this recommendation. Pt. continues to work on improving UE functioning, trunk control, attention, left sdied awareness, strength, motor control, and coordination skills in order to improvie overall ADL, and IADL performance skills.                           OT Education - 11/07/18 1833    Education Details  LUE functioning, [place], trunk control    Person(s) Educated  Patient    Methods  Explanation;Demonstration    Comprehension  Verbalized understanding;Returned demonstration;Verbal cues required          OT Long Term Goals - 10/28/18 1532      OT LONG TERM GOAL #1   Title  Pt. will increase left shoulder flexion AROM by 10 degrees to access cabinet/shelf.    Baseline  Pt. has difficulty reaching for items with the LUE secondary to decreased LUE ROM    Time  12    Period  Weeks    Status  On-going    Target Date  01/15/19      OT LONG TERM GOAL #2   Title  Pt. will donn a shirt with Supervision.    Baseline  Min    Time  12    Period  Weeks    Status  On-going    Target Date  01/15/19      OT LONG TERM GOAL #3   Title  Pt. will require ModA to perform LE dressing    Baseline  MaxA left, ModA with the right    Time  12    Period  Weeks    Status  On-going    Target Date  01/15/19      OT LONG TERM GOAL #4   Title  Pt. will improve left grip strength by 10# to be able to open a jar/container.    Baseline  Decreased grip since hospital readmission, pt. has difficulty opening containers    Time  12    Period  Weeks    Status  On-going    Target Date  01/15/19      OT LONG TERM GOAL #5   Title  Pt. will improve left hand [facility] skills to be able to assit with buttoning/zipping.    Baseline  Pt. has difficulty [facility] skills buttoning/zipping    Time  12    Period  Weeks    Status  On-going    Target Date  01/15/19      OT LONG TERM GOAL #6   Title  Pt. will demonstrate visual conmpensatory strategies during 100% of the time during ADLs    Baseline  Pt. requires cues for left sides awareness.    Time  12    Period  Weeks    Status  On-going    Target Date  01/15/19      OT LONG TERM GOAL #  7   Title  Pt. will prepare a simple cold snack from w/c with supervision using  cognitive compensatory strategies 100% of the time.    Baseline  Pt. is unable    Time  12    Period  Weeks    Target Date  11/27/18      OT LONG TERM GOAL #8   Title  Pt. will accurately identify potential safety hazard using good safety awareness, and judgement 100% for ADLs, and IADLs.    Baseline  Limited    Time  12    Period  Weeks    Status  On-going    Target Date  11/27/18      OT LONG TERM GOAL  #9   TITLE  Pt. will  navigate his w/c around obstacles with Supervision and 100% accuracy    Baseline  Pt. requires cuing for obstacle on the left    Time  12    Period  Weeks    Status  On-going    Target Date  11/27/18            Plan - 11/07/18 1834    Clinical Impression Statement  Pt. continues to make steady progress. Pt., and his daughter reported that they tried the recommendation of using a wedge at his upper back while attempting to perform posterior hygiene skills from sidelying at home. Pt. reported more success with this recommendation. Pt. continues to work on improving UE functioning, trunk control, attention, left sdied awareness, strength, motor control, and coordination skills in order to improve overall ADL, and IADL performance skills.    OT Occupational Profile and History  Problem Focused Assessment - Including review of records relating to presenting problem    Occupational performance deficits (Please refer to evaluation for details):  ADL's    Body Structure / Function / Physical Skills  UE functional use;Coordination;FMC;Dexterity;Strength;ROM    Cognitive Skills  Attention;Memory;Emotional;Problem Solve;Safety Awareness    Rehab Potential  Good    Clinical Decision Making  Several treatment options, min-mod task modification necessary    Comorbidities Affecting Occupational Performance:  Presence of comorbidities impacting occupational performance    Comorbidities impacting occupational performance description:  Phyical, cognitive, visual,  medical  comorbidities    Modification or Assistance to Complete Evaluation   No modification of tasks or assist necessary to complete eval    OT Frequency  3x / week    OT Duration  12 weeks    OT Treatment/Interventions  Self-care/ADL training;DME and/or AE instruction;Therapeutic exercise;Therapeutic activities;Moist Heat;Cognitive remediation/compensation;Neuromuscular education;Visual/perceptual remediation/compensation;Coping strategies training;Patient/family education;Passive range of motion;Psychosocial skills training;Energy conservation;Functional Mobility Training    Recommended Other Services  PT    Consulted and Agree with Plan of Care  Patient    Family Member Consulted  Daughter [name]       Patient will benefit from skilled therapeutic intervention in order to improve the following deficits and impairments:   Body Structure / Function / Physical Skills: UE functional use, Coordination, FMC, Dexterity, Strength, ROM Cognitive Skills: Attention, Memory, Emotional, Problem Solve, Safety Awareness     Visit Diagnosis: Muscle weakness (generalized)  Other lack of coordination    Problem List Patient Active Problem List   Diagnosis Date Noted  . Sepsis ([place]) 10/18/2018    Harrel Carina, MS, OTR/L 11/07/2018, 6:43 PM  [place] MAIN [facility] SERVICES [address] [place], [place], [address] Phone: [phone]   Fax:  [phone]  Name: Kenneth Summers MRN:  [id number] Date of Birth: 1945-05-02

## 2018-11-11 ENCOUNTER — Encounter: Payer: Self-pay | Admitting: Occupational Therapy

## 2018-11-11 ENCOUNTER — Ambulatory Visit: Payer: Medicare Other

## 2018-11-11 ENCOUNTER — Ambulatory Visit: Payer: Medicare Other | Admitting: Occupational Therapy

## 2018-11-11 ENCOUNTER — Other Ambulatory Visit: Payer: Self-pay

## 2018-11-11 DIAGNOSIS — M6281 Muscle weakness (generalized): Secondary | ICD-10-CM

## 2018-11-11 DIAGNOSIS — I69354 Hemiplegia and hemiparesis following cerebral infarction affecting left non-dominant side: Secondary | ICD-10-CM

## 2018-11-11 DIAGNOSIS — R278 Other lack of coordination: Secondary | ICD-10-CM

## 2018-11-11 DIAGNOSIS — R262 Difficulty in walking, not elsewhere classified: Secondary | ICD-10-CM

## 2018-11-11 NOTE — Therapy (Addendum)
[place] MAIN [facility] SERVICES [address] [place], [place], [address] Phone: [phone]   Fax:  [phone]  Occupational Therapy Treatment  Patient Details  Name: Kenneth Summers MRN: [id number] Date of Birth: [birth year] No data recorded  Encounter Date: 11/11/2018  OT End of Session - 11/11/18 1652    Visit Number  56    Number of Visits  31    Date for OT Re-Evaluation  01/15/19    Authorization Type  Progress report period starting 10/30/2018    OT Start Time  1345    OT Stop Time  1430    OT Time Calculation (min)  45 min    Activity Tolerance  Patient tolerated treatment well    Behavior During Therapy  [facility] for tasks assessed/performed       Past Medical History:  Diagnosis Date  . Alcohol abuse   . APS (antiphospholipid syndrome) ([place])   . BPH (benign prostatic hyperplasia)   . CAD (coronary artery disease)   . Cellulitis   . Cervical spinal stenosis   . Chronic osteomyelitis ([place])   . CKD (chronic kidney disease)   . CKD (chronic kidney disease)   . Clostridium difficile diarrhea   . Depression   . Diplopia   . Fatigue   . GERD (gastroesophageal reflux disease)   . Hyperlipemia   . Hypertension   . Hypovitaminosis D   . IBS (irritable bowel syndrome)   . 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 Insomnia   . Left lumbosacral radiculopathy   . Moderate obstructive sleep apnea   . Osteomyelitis of foot ([place])   . PAD (peripheral artery disease) ([place])   . Pruritus   . RA (rheumatoid arthritis) ([place])   . Radiculopathy   . Restless legs syndrome   . RLS (restless legs syndrome)   . SLE (systemic lupus erythematosus related syndrome) ([place])   . Stroke ([place])   . Toxic maculopathy of both eyes     Past Surgical History:  Procedure Laterality Date  . CARDIAC CATHETERIZATION    . CAROTID ENDARTERECTOMY    . CERVICAL LAMINECTOMY    . CORONARY ANGIOPLASTY    . FRACTURE SURGERY    . fusion C5-6-7    . HERNIA REPAIR    . KNEE  ARTHROSCOPY    . TONSILLECTOMY      There were no vitals filed for this visit.  Subjective Assessment - 11/11/18 1651    Subjective   Pt. was alert today. No reports of pain.    Patient is accompanied by:  Kenneth Summers    Pertinent History  Pt. is a 73 y.o. 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 Pt. PMHx includes: Multiple Falls, Lupus, DJD, Renal Abscess, CVA. Pt. resides with his wife. Pt.'s wife and daughter assist with ADLs. Pt. has caregivers in  for 2 hours a day, 6 days a week. Pt. received Rehab services in acute care, at SNF for STR, and [place] services. Pt. is retired from [employer] for [employer] and [employer].    Currently in Pain?  No/denies      OT TREATMENT    Neuro muscular re-education:  Pt. Worked on sequencing tasks while increasing complexity of the sequencing tasks. Pt. Worked on sequencing cards in serial order, when elevating UEs to place them on a dowel rod while sequencing clips with a sequence of 4 to 5 color patterns. Pt. Was able to complete  simple sequences, however required verbal cues, and assist to complete more complex sequences.  Selfcare:  Pt. worked on UE dressing skills, requiring modA to donn his jacket. Pt. required verbal cues for set-up, and to initiate thread his LUE to his neck. Pt. required modA to donn jacket around his back. Pt. required cues, and assist for locating, and donning the right side of his jacket.    Response to Treatment  Pt. requires cues, and assist to perform UE dressing donning his jacket. Pt. is making progress overall, however continues to present with overall LUE weakness, with limited sustained elevation when using Bilateral UEs. Pt. continues to work on improving LUE functioning, and functional reaching in order to improve LUE engagement during ADLs, and IADL tasks.                    OT Education - 11/11/18 1652    Education Details  LUE functioning, [place],  trunk control    Person(s) Educated  Patient    Methods  Explanation;Demonstration    Comprehension  Verbalized understanding;Returned demonstration;Verbal cues required          OT Long Term Goals - 10/28/18 1532      OT LONG TERM GOAL #1   Title  Pt. will increase left shoulder flexion AROM by 10 degrees to access cabinet/shelf.    Baseline  Pt. has difficulty reaching for items with the LUE secondary to decreased LUE ROM    Time  12    Period  Weeks    Status  On-going    Target Date  01/15/19      OT LONG TERM GOAL #2   Title  Pt. will donn a shirt with Supervision.    Baseline  Min    Time  12    Period  Weeks    Status  On-going    Target Date  01/15/19      OT LONG TERM GOAL #3   Title  Pt. will require ModA to perform LE dressing    Baseline  MaxA left, ModA with the right    Time  12    Period  Weeks    Status  On-going    Target Date  01/15/19      OT LONG TERM GOAL #4   Title  Pt. will improve left grip strength by 10# to be able to open a jar/container.    Baseline  Decreased grip since hospital readmission, pt. has difficulty opening containers    Time  12    Period  Weeks    Status  On-going    Target Date  01/15/19      OT LONG TERM GOAL #5   Title  Pt. will improve left hand [facility] skills to be able to assit with buttoning/zipping.    Baseline  Pt. has difficulty [facility] skills buttoning/zipping    Time  12    Period  Weeks    Status  On-going    Target Date  01/15/19      OT LONG TERM GOAL #6   Title  Pt. will demonstrate visual conmpensatory strategies during 100% of the time during ADLs    Baseline  Pt. requires cues for left sides awareness.    Time  12    Period  Weeks    Status  On-going    Target Date  01/15/19      OT LONG TERM GOAL #7   Title  Pt. will prepare a  simple cold snack from w/c with supervision using cognitive compensatory strategies 100% of the time.    Baseline  Pt. is unable    Time  12    Period  Weeks    Target Date   11/27/18      OT LONG TERM GOAL #8   Title  Pt. will accurately identify potential safety hazard using good safety awareness, and judgement 100% for ADLs, and IADLs.    Baseline  Limited    Time  12    Period  Weeks    Status  On-going    Target Date  11/27/18      OT LONG TERM GOAL  #9   TITLE  Pt. will  navigate his w/c around obstacles with Supervision and 100% accuracy    Baseline  Pt. requires cuing for obstacle on the left    Time  12    Period  Weeks    Status  On-going    Target Date  11/27/18            Plan - 11/11/18 1653    Clinical Impression Statement  Pt. reports that his wife is in the hospital after having had a fall. Pt. reports that she had to have hip surgery. Pt. reports that his grandson has been staying with him. Pt. is making progress overall, however continues to present with overall LUE weakness, with limited sustained elevation when using Bilateral UEs. Pt. continues to work on improving LUE functioning, and functional reaching in order to improve LUE engagement during ADLs, and IADL tasks.    OT Occupational Profile and History  Problem Focused Assessment - Including review of records relating to presenting problem    Occupational performance deficits (Please refer to evaluation for details):  ADL's    Body Structure / Function / Physical Skills  UE functional use;Coordination;FMC;Dexterity;Strength;ROM    Cognitive Skills  Attention;Memory;Emotional;Problem Solve;Safety Awareness    Rehab Potential  Good    Clinical Decision Making  Several treatment options, min-mod task modification necessary    Comorbidities Affecting Occupational Performance:  Presence of comorbidities impacting occupational performance    Comorbidities impacting occupational performance description:  Phyical, cognitive, visual,  medical comorbidities    Modification or Assistance to Complete Evaluation   No modification of tasks or assist necessary to complete eval    OT Frequency   3x / week    OT Duration  12 weeks    OT Treatment/Interventions  Self-care/ADL training;DME and/or AE instruction;Therapeutic exercise;Therapeutic activities;Moist Heat;Cognitive remediation/compensation;Neuromuscular education;Visual/perceptual remediation/compensation;Coping strategies training;Patient/Kenneth education;Passive range of motion;Psychosocial skills training;Energy conservation;Functional Mobility Training    Consulted and Agree with Plan of Care  Patient    Kenneth Summers Consulted  Daughter [name]       Patient will benefit from skilled therapeutic intervention in order to improve the following deficits and impairments:   Body Structure / Function / Physical Skills: UE functional use, Coordination, FMC, Dexterity, Strength, ROM Cognitive Skills: Attention, Memory, Emotional, Problem Solve, Safety Awareness     Visit Diagnosis: Muscle weakness (generalized)  Other lack of coordination    Problem List Patient Active Problem List   Diagnosis Date Noted  . Sepsis ([place]) 10/18/2018    Harrel Carina, MS, OTR/L 11/11/2018, 5:06 PM  [place] MAIN [facility] SERVICES [address] [place], [place], [address] Phone: [phone]   Fax:  [phone]  Name: SAAFIR WIESEMANN MRN: [id number] Date of Birth: [birth year]

## 2018-11-11 NOTE — Therapy (Signed)
[place] MAIN [facility] SERVICES [address] [place], [place], [address] Phone: [phone]   Fax:  [phone]  Physical Therapy Treatment  Patient Details  Name: Kenneth Summers MRN: [id number] Date of Birth: 09-17-1945 Referring Provider (PT): Dr. [name], Guy Begin   Encounter Date: 11/11/2018  PT End of Session - 11/11/18 1358    Visit Number  27    Number of Visits  75    Date for PT Re-Evaluation  12/04/18    Authorization Type  Medicare reporting period starting 09/11/18    Authorization Time Period  Current cert period 10/11/7094-28/36/62    PT Start Time  1301    PT Stop Time  1345    PT Time Calculation (min)  44 min    Equipment Utilized During Treatment  Gait belt    Activity Tolerance  Patient tolerated treatment well    Behavior During Therapy  [facility] for tasks assessed/performed       Past Medical History:  Diagnosis Date  . Alcohol abuse   . APS (antiphospholipid syndrome) ([place])   . BPH (benign prostatic hyperplasia)   . CAD (coronary artery disease)   . Cellulitis   . Cervical spinal stenosis   . Chronic osteomyelitis ([place])   . CKD (chronic kidney disease)   . CKD (chronic kidney disease)   . Clostridium difficile diarrhea   . Depression   . Diplopia   . Fatigue   . GERD (gastroesophageal reflux disease)   . Hyperlipemia   . Hypertension   . Hypovitaminosis D   . IBS (irritable bowel syndrome)   . IgA deficiency ([place])   . Insomnia   . Left lumbosacral radiculopathy   . Moderate obstructive sleep apnea   . Osteomyelitis of foot ([place])   . PAD (peripheral artery disease) ([place])   . Pruritus   . RA (rheumatoid arthritis) ([place])   . Radiculopathy   . Restless legs syndrome   . RLS (restless legs syndrome)   . SLE (systemic lupus erythematosus related syndrome) ([place])   . Stroke ([place])   . Toxic maculopathy of both eyes     Past Surgical History:  Procedure Laterality Date  . CARDIAC CATHETERIZATION    .  CAROTID ENDARTERECTOMY    . CERVICAL LAMINECTOMY    . CORONARY ANGIOPLASTY    . FRACTURE SURGERY    . fusion C5-6-7    . HERNIA REPAIR    . KNEE ARTHROSCOPY    . TONSILLECTOMY      There were no vitals filed for this visit.  Subjective Assessment - 11/11/18 1318    Subjective  Patient reports feeling good. Denies any soreness and reports minimal fatigue. He did wake  Daughter reports HEP has been going well.    Pertinent History  Patient is a 73 y.o. male who presents to outpatient physical therapy with a referral for medical diagnosis of CVA. This patient's chief complaints consist of left hemiplegia and overall deconditioning leading to the following functional deficits: dependent for ADLs, IADLs, unable to transfer to car, dependent transfers at home with hoyer lift, unable to walk or stand without significant assistance, difficulty with W/C navigation..    Limitations  Sitting;Lifting;Standing;House hold activities;Writing;Walking;Other (comment)    Diagnostic tests  Brain MRI 11/01/2017: IMPRESSION: 1. Acute right ACA territory infarct as demonstrated on prior CT imaging. No intracranial hemorrhage or significant mass effect. 2. 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    Currently in  Pain?  No/denies          TREATMENT:   Therapeutic Activity  PT instructed patient in sit<>Stand transfers with mod VCs for proper positioning including forward weight shift, hand placement and adjusting power chair for better ease of transfer. Performed transfers in front of // bars in order to simulate home environment with counter tops. Performed three reps with patient. He demonstrates improved upright posture especially during the first and second rep. He stands for approximately 2 minutes during the first attempt. Intermittent cues required for fully erect posture with hip and thoracic extension. Instructed patient in lateral weight shifts with sacral slide to  move toward edge of chair prior to each transfer attempt.   Ther-ex  Seated forward, R lateral, and L lateral ball rolls x 5 each direction; Hooklying clams with manual resistance x 10; Hooklying adductor squeeze with manual resistance x 10, improved strength noted today; Hooklying L hip flexion marching x 10, intermittent cues required;  Seated LLE LAQ x 5, assist required to reach end range and verbal/tactile cues to hold contraction; Hooklying LLE manually resisted leg press x 10;   Pt educated throughout session about proper posture and technique with exercises. Improved exercise technique, movement at target joints, use of target muscles after min to mod verbal, visual, tactile cues.    Patient motivated and participated well within session. He is able to stand next the parallel bars today to simulate the counter top however becomes increasingly fatigued with repetition. He continues to demonstrate improved LLE strength with seated and hooklying exercises today.  He does require cues for motivation due to increased fatigue toward the end of session. He would benefit from additional skilled PT intervention to improve strength, balance and mobility.                      PT Short Term Goals - 10/23/18 1314      PT SHORT TERM GOAL #1   Title  Be independent with initial home exercise program for self-management of symptoms.    Baseline  HEP to be given at second session; advice to practice unsupported sitting at home (06/19/2018); 07/08/18: Performing at home, 6/29: needs assistance but is doing exercise program regularly; 08/22/18: adherent;    Time  2    Period  Weeks    Status  Achieved    Target Date  06/26/18        PT Long Term Goals - 10/23/18 1315      PT LONG TERM GOAL #1   Title  Patient will complete all bed mobility with min A to improve functional independence for getting in and out of bed and adjusting in bed.     Baseline  max A - total A reported by  family (06/19/2018); 07/08/18: still requires maxA, dtr reports improvement in rolling since starting therapy; 6/23: min A to supervision in clinic; not doing at home right; 8/5: Pt's daughter states that his rolling has improved, but still has difficulty with all other bed mobility; 10/23/18: pts daughter states that she is doing about 35% of the work if he is in proper position, he does uses the bed rails to help roll;  using Hoyer for coming to sit EOB    Time  12    Period  Weeks    Status  Partially Met    Target Date  12/04/18      PT LONG TERM GOAL #2   Title  Patient will  complete sit <> stand transfer chair to chair with Min A and LRAD to improve functional independence for household and community mobility.     Baseline  max A +2 STS at W/C (06/12/2018); max A +2-3 STS or stand pivot transfer, able to complete more reps (06/19/2018); 07/08/18: not safe to attempt at this time; 09/11/18: requires maxA+1 or modA+2 for STSs and squat pivot transfer; 10/23/18: requires maxA+1 or modA+2 for STSs and squat pivot transfer    Time  12    Period  Weeks    Status  On-going    Target Date  12/04/18      PT LONG TERM GOAL #3   Title  Patient will navigate power w/c with min A x 100 feet to improve mobility for household and short community distances.    Baseline  required total A (06/12/2018); able to roll 20 feet with min A (06/20/2018); 07/08/18: Pt can go approximately 10' without assist per family;  10/23/2018: pt's daughter states that his power WC mobility has become 100% better with the new hand control, slowed speed, and with practice    Time  12    Period  Weeks    Status  Achieved    Target Date  10/23/18      PT LONG TERM GOAL #4   Title  Patient will complete car transfers W/C to family SUV with min A using LRAD to improve his abilty to participate in community activities.     Baseline  unable to complete car transfers at all (06/12/2018); unable to complete car transfers at all (06/19/2018).; 07/08/18:  Unable to perform at this time; 09/11/18: unable to perform at this time; 10/23/2018: unable to complete car transfers at all    Time  12    Period  Weeks    Status  On-going    Target Date  12/04/18      PT LONG TERM GOAL #5   Title  Patient will ambulate at least 150 feet with mod I using LRAD to improve mobility for household and community distances.     Baseline  unable to take steps (06/12/2018); able to shift R leg forward and back with max A and RW at edge of plinth (06/19/2018); 07/08/18: unable to ambulate at this time. 09/11/18: unable to perform at this time; 10/23/2018: unable to perform at this time    Time  12    Period  Weeks    Status  On-going    Target Date  12/04/18      PT LONG TERM GOAL #6   Title  Patient will navigate 4 steps with min A and BUE support to improve his ability to access community and social participation.     Baseline  unable to complet stairs (06/12/2018); unable to complete stairs (06/19/2018); 07/08/18: unable to attempt at this time; 09/11/18: unsafe to attempt at this time; 10/23/2018: unable to complet stairs (06/12/2018); unable to complete stairs (06/19/2018); 07/08/18: unable to attempt at this time; 09/11/18: unsafe to attempt at this time; 10/23/18: unsafe to attempt at this time    Time  12    Period  Weeks    Status  On-going    Target Date  12/04/18      PT LONG TERM GOAL #7   Title  Patient will increase functional reach test to >15 inches in sitting to exhibit improved sitting balance and positioning and reduce fall risk;    Baseline  07/30/18: 12 inches; 09/11/18: 10-12 inches;  10/23/18: 12-13 inch    Time  4    Period  Weeks    Status  Partially Met    Target Date  12/04/18            Plan - 11/11/18 1359    Clinical Impression Statement  Patient motivated and participated well within session. He is able to stand next the parallel bars today to simulate the counter top however becomes increasingly fatigued with repetition. He continues to demonstrate  improved LLE strength with seated and hooklying exercises today.  He does require cues for motivation due to increased fatigue toward the end of session. He would benefit from additional skilled PT intervention to improve strength, balance and mobility.    Personal Factors and Comorbidities  Age;Comorbidity 3+    Comorbidities  Relevant past medical history and comorbidities include long term steroid use for lupus, CKD, chronic osteomyelitis, cervical spine stenosis, BPH, APS, alcohol abuse, peripheral artery disease, depression, GERD, obstructive sleep apnea, HTN, IBS, IgA deficiency, lumbar radiculopathy, RA, Stroke, toxic maculopathy in both eyes, systemic lupus erythematosus related syndrome, cardiac catheterization, carotid endarterectomy, cervical laminectomy, coronary angioplasty, Fusion C5-C7, knee arthroplasty, lumbar surgery.    Examination-Activity Limitations  Bed Mobility;Bend;Sit;Toileting;Stand;Stairs;Lift;Transfers;Squat;Locomotion Level;Carry;Dressing;Hygiene/Grooming;Continence    Examination-Participation Restrictions  Yard Work;Interpersonal Relationship;Community Activity    Stability/Clinical Decision Making  Evolving/Moderate complexity    Rehab Potential  Fair    PT Frequency  3x / week    PT Duration  12 weeks    PT Treatment/Interventions  ADLs/Self Care Home Management;Electrical Stimulation;Moist Heat;Cryotherapy;Gait training;Stair training;Functional mobility training;Therapeutic exercise;Balance training;Neuromuscular re-education;Cognitive remediation;Patient/family education;Orthotic Fit/Training;Wheelchair mobility training;Manual techniques;Passive range of motion;Energy conservation;Joint Manipulations    PT Next Visit Plan  Work on strength, sitting/standing balance, functional activities such as rolling, bed mobility, transfers; caregiver training    PT Home Exercise Plan  handout provided via [place] during previous sessions    Consulted and Agree with Plan of Care   Patient;Family member/caregiver    Family Member Consulted  daughter [name]       Patient will benefit from skilled therapeutic intervention in order to improve the following deficits and impairments:  Abnormal gait, Decreased activity tolerance, Decreased cognition, Decreased endurance, Decreased knowledge of use of DME, Decreased range of motion, Decreased skin integrity, Decreased strength, Impaired perceived functional ability, Impaired sensation, Impaired UE functional use, Improper body mechanics, Pain, Cardiopulmonary status limiting activity, Decreased balance, Decreased coordination, Decreased mobility, Difficulty walking, Impaired tone, Postural dysfunction  Visit Diagnosis: Muscle weakness (generalized)  Difficulty in walking, not elsewhere classified  Hemiplegia and hemiparesis following cerebral infarction affecting left non-dominant side [facility])     Problem List Patient Active Problem List   Diagnosis Date Noted  . Sepsis ([place]) 10/18/2018   Phillips Grout PT, DPT, GCS  Dream [place]man 11/11/2018, 3:03 PM  [place] MAIN [facility] SERVICES [address] [place], [place], [address] Phone: [phone]   Fax:  [phone]  Name: Kenneth Summers MRN: [id number] Date of Birth: 1945/02/26

## 2018-11-13 ENCOUNTER — Other Ambulatory Visit: Payer: Self-pay

## 2018-11-13 ENCOUNTER — Ambulatory Visit: Payer: Medicare Other | Admitting: Occupational Therapy

## 2018-11-13 ENCOUNTER — Encounter: Payer: Self-pay | Admitting: Occupational Therapy

## 2018-11-13 ENCOUNTER — Ambulatory Visit: Payer: Medicare Other | Admitting: Physical Therapy

## 2018-11-13 ENCOUNTER — Encounter: Payer: Self-pay | Admitting: Physical Therapy

## 2018-11-13 DIAGNOSIS — R278 Other lack of coordination: Secondary | ICD-10-CM

## 2018-11-13 DIAGNOSIS — M6281 Muscle weakness (generalized): Secondary | ICD-10-CM | POA: Diagnosis not present

## 2018-11-13 DIAGNOSIS — R262 Difficulty in walking, not elsewhere classified: Secondary | ICD-10-CM

## 2018-11-13 DIAGNOSIS — R296 Repeated falls: Secondary | ICD-10-CM

## 2018-11-13 NOTE — Therapy (Signed)
[place] MAIN [facility] SERVICES [address] [place], [place], [address] Phone: [phone]   Fax:  [phone]  Physical Therapy Treatment  Patient Details  Name: Kenneth Summers MRN: [id number] Date of Birth: 1945/06/07 Referring Provider (PT): Dr. [name], Guy Begin   Encounter Date: 11/13/2018  PT End of Session - 11/13/18 1452    Visit Number  13    Number of Visits  20    Date for PT Re-Evaluation  12/04/18    Authorization Type  Medicare reporting period starting 09/11/18    Authorization Time Period  Current cert period 03/11/6331-54/56/25    PT Start Time  1300    PT Stop Time  1345    PT Time Calculation (min)  45 min    Equipment Utilized During Treatment  Gait belt    Activity Tolerance  Patient tolerated treatment well    Behavior During Therapy  [facility] for tasks assessed/performed       Past Medical History:  Diagnosis Date  . Alcohol abuse   . APS (antiphospholipid syndrome) ([place])   . BPH (benign prostatic hyperplasia)   . CAD (coronary artery disease)   . Cellulitis   . Cervical spinal stenosis   . Chronic osteomyelitis ([place])   . CKD (chronic kidney disease)   . CKD (chronic kidney disease)   . Clostridium difficile diarrhea   . Depression   . Diplopia   . Fatigue   . GERD (gastroesophageal reflux disease)   . Hyperlipemia   . Hypertension   . Hypovitaminosis D   . IBS (irritable bowel syndrome)   . 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 Insomnia   . Left lumbosacral radiculopathy   . Moderate obstructive sleep apnea   . Osteomyelitis of foot ([place])   . PAD (peripheral artery disease) ([place])   . Pruritus   . RA (rheumatoid arthritis) ([place])   . Radiculopathy   . Restless legs syndrome   . RLS (restless legs syndrome)   . SLE (systemic lupus erythematosus related syndrome) ([place])   . Stroke ([place])   . Toxic maculopathy of both eyes     Past Surgical History:  Procedure Laterality Date  . CARDIAC CATHETERIZATION    .  CAROTID ENDARTERECTOMY    . CERVICAL LAMINECTOMY    . CORONARY ANGIOPLASTY    . FRACTURE SURGERY    . fusion C5-6-7    . HERNIA REPAIR    . KNEE ARTHROSCOPY    . TONSILLECTOMY      There were no vitals filed for this visit.  Subjective Assessment - 11/13/18 1450    Subjective  Patient reports that he is feeling tired today. Denies any soreness or pain this date. Patient did stand twice with son-in-law yesterday, has not stood today yet.    Patient is accompained by:  Family member    Pertinent History  Patient is a 73 y.o. male who presents to outpatient physical therapy with a referral for medical diagnosis of CVA. This patient's chief complaints consist of left hemiplegia and overall deconditioning leading to the following functional deficits: dependent for ADLs, IADLs, unable to transfer to car, dependent transfers at home with hoyer lift, unable to walk or stand without significant assistance, difficulty with W/C navigation..    Limitations  Sitting;Lifting;Standing;House hold activities;Writing;Walking;Other (comment)    Diagnostic tests  Brain MRI 11/01/2017: IMPRESSION: 1. Acute right ACA territory infarct as demonstrated on prior CT imaging. No intracranial hemorrhage or significant mass effect. 2. Age  advanced global brain atrophy and small chronic right occipital Infarct.    Currently in Pain?  No/denies    Pain Score  0-No pain          Treatment:  Discussed home environment, kitchen sink setup for practicing STS transfer at home.  -Forward weight shift x5 with hip clearance from chair, 3 sets; improved with mod VC to arch his back and look into his reflection in the mirror to promote self-correction to midline sitting balance; able to accomplish ~2 inches of hip clearance from seat.   -STS transfers x6 using parallel bar for BUE support, PT and daughter on left side to give TC for knee and hip extension; patient claimed he was too fatigued to stand as long as last session,  which was exasperated by left knee valgus moment and decreased hip extension in weight bearing; improved with use of bolster to block left knee valgus and mod VC to bring his belt to the parallel bar;   -Simulated leg press in WC x10 using BUE support on parallel bars to lift hips from chair, maintaining forward weight shift as initial position for STS; cues to straighten knees for improved hip clearance, improved with bolster placed between knees to prevent left knee valgus moment;   -Patient performed x2 additional STS transfers using simulated leg press position as the initial phase, achieved 22 seconds max standing time this date, using bolster between knees.   Pt educated throughout session about proper posture and technique with exercises. Improved exercise technique, movement at target joints, use of target muscles after min to mod verbal, visual, tactile cues.   Response to Treatment:  Patient demonstrates good motivation throughout today's session. Patient was able to stand for 22 seconds this date, required mod VC, TC, demonstrations, and bolster between knees for stability. Patient continues to be challenged by fast onset of fatigue, maintaining midline balance in sitting and standing, proprioception and strength of LLE, and transferring to standing, which improved with cues to look at mirror for balance and blocking of left knee during STS transfer for improved stability. Patient is also challenged by executing correct timing of standing during anterior weight shifts which often resulted in pushing himself back into the chair, which improved significantly with completing STS transfer from modified leg press position where patient's COM is already shifted anteriorly.    PT Education - 11/13/18 1303    Education Details  transfers/positioning, exercise, HEP    Person(s) Educated  Patient;Child(ren)    Methods  Explanation;Verbal cues    Comprehension  Verbalized understanding;Returned  demonstration;Verbal cues required;Need further instruction       PT Short Term Goals - 10/23/18 1314      PT SHORT TERM GOAL #1   Title  Be independent with initial home exercise program for self-management of symptoms.    Baseline  HEP to be given at second session; advice to practice unsupported sitting at home (06/19/2018); 07/08/18: Performing at home, 6/29: needs assistance but is doing exercise program regularly; 08/22/18: adherent;    Time  2    Period  Weeks    Status  Achieved    Target Date  06/26/18        PT Long Term Goals - 10/23/18 1315      PT LONG TERM GOAL #1   Title  Patient will complete all bed mobility with min A to improve functional independence for getting in and out of bed and adjusting in bed.  Baseline  max A - total A reported by family (06/19/2018); 07/08/18: still requires maxA, dtr reports improvement in rolling since starting therapy; 6/23: min A to supervision in clinic; not doing at home right; 8/5: Pt's daughter states that his rolling has improved, but still has difficulty with all other bed mobility; 10/23/18: pts daughter states that she is doing about 35% of the work if he is in proper position, he does uses the bed rails to help roll;  using Hoyer for coming to sit EOB    Time  12    Period  Weeks    Status  Partially Met    Target Date  12/04/18      PT LONG TERM GOAL #2   Title  Patient will complete sit <> stand transfer chair to chair with Min A and LRAD to improve functional independence for household and community mobility.     Baseline  max A +2 STS at W/C (06/12/2018); max A +2-3 STS or stand pivot transfer, able to complete more reps (06/19/2018); 07/08/18: not safe to attempt at this time; 09/11/18: requires maxA+1 or modA+2 for STSs and squat pivot transfer; 10/23/18: requires maxA+1 or modA+2 for STSs and squat pivot transfer    Time  12    Period  Weeks    Status  On-going    Target Date  12/04/18      PT LONG TERM GOAL #3   Title  Patient  will navigate power w/c with min A x 100 feet to improve mobility for household and short community distances.    Baseline  required total A (06/12/2018); able to roll 20 feet with min A (06/20/2018); 07/08/18: Pt can go approximately 10' without assist per family;  10/23/2018: pt's daughter states that his power WC mobility has become 100% better with the new hand control, slowed speed, and with practice    Time  12    Period  Weeks    Status  Achieved    Target Date  10/23/18      PT LONG TERM GOAL #4   Title  Patient will complete car transfers W/C to family SUV with min A using LRAD to improve his abilty to participate in community activities.     Baseline  unable to complete car transfers at all (06/12/2018); unable to complete car transfers at all (06/19/2018).; 07/08/18: Unable to perform at this time; 09/11/18: unable to perform at this time; 10/23/2018: unable to complete car transfers at all    Time  12    Period  Weeks    Status  On-going    Target Date  12/04/18      PT LONG TERM GOAL #5   Title  Patient will ambulate at least 150 feet with mod I using LRAD to improve mobility for household and community distances.     Baseline  unable to take steps (06/12/2018); able to shift R leg forward and back with max A and RW at edge of plinth (06/19/2018); 07/08/18: unable to ambulate at this time. 09/11/18: unable to perform at this time; 10/23/2018: unable to perform at this time    Time  12    Period  Weeks    Status  On-going    Target Date  12/04/18      PT LONG TERM GOAL #6   Title  Patient will navigate 4 steps with min A and BUE support to improve his ability to access community and social participation.  Baseline  unable to complet stairs (06/12/2018); unable to complete stairs (06/19/2018); 07/08/18: unable to attempt at this time; 09/11/18: unsafe to attempt at this time; 10/23/2018: unable to complet stairs (06/12/2018); unable to complete stairs (06/19/2018); 07/08/18: unable to attempt at this time;  09/11/18: unsafe to attempt at this time; 10/23/18: unsafe to attempt at this time    Time  12    Period  Weeks    Status  On-going    Target Date  12/04/18      PT LONG TERM GOAL #7   Title  Patient will increase functional reach test to >15 inches in sitting to exhibit improved sitting balance and positioning and reduce fall risk;    Baseline  07/30/18: 12 inches; 09/11/18: 10-12 inches; 10/23/18: 12-13 inch    Time  4    Period  Weeks    Status  Partially Met    Target Date  12/04/18            Plan - 11/13/18 1623    Clinical Impression Statement  Patient presented with good motication and participated well within session. Patient transfers to standing with parallel bars was limited by fatigue today, requiring mod VC and TC to achieve and maintain standing position, and was able to stand for a maximum of 22 seconds this date. Patient demonstrated improved trunk control to maintain midline in sitting balance this date. He would continue to benefit from skilled PT to address the deficits outlined in this note.    Personal Factors and Comorbidities  Age;Comorbidity 3+    Comorbidities  Relevant past medical history and comorbidities include long term steroid use for lupus, CKD, chronic osteomyelitis, cervical spine stenosis, BPH, APS, alcohol abuse, peripheral artery disease, depression, GERD, obstructive sleep apnea, HTN, IBS, IgA deficiency, lumbar radiculopathy, RA, Stroke, toxic maculopathy in both eyes, systemic lupus erythematosus related syndrome, cardiac catheterization, carotid endarterectomy, cervical laminectomy, coronary angioplasty, Fusion C5-C7, knee arthroplasty, lumbar surgery.    Examination-Activity Limitations  Bed Mobility;Bend;Sit;Toileting;Stand;Stairs;Lift;Transfers;Squat;Locomotion Level;Carry;Dressing;Hygiene/Grooming;Continence    Examination-Participation Restrictions  Yard Work;Interpersonal Relationship;Community Activity    Stability/Clinical Decision Making   Evolving/Moderate complexity    Rehab Potential  Fair    PT Frequency  3x / week    PT Duration  12 weeks    PT Treatment/Interventions  ADLs/Self Care Home Management;Electrical Stimulation;Moist Heat;Cryotherapy;Gait training;Stair training;Functional mobility training;Therapeutic exercise;Balance training;Neuromuscular re-education;Cognitive remediation;Patient/family education;Orthotic Fit/Training;Wheelchair mobility training;Manual techniques;Passive range of motion;Energy conservation;Joint Manipulations    PT Next Visit Plan  Work on strength, sitting/standing balance, functional activities such as rolling, bed mobility, transfers; caregiver training    PT Home Exercise Plan  handout provided via Beth during previous sessions    Consulted and Agree with Plan of Care  Patient;Family member/caregiver    Family Member Consulted  daughter Kenneth Summers       Patient will benefit from skilled therapeutic intervention in order to improve the following deficits and impairments:  Abnormal gait, Decreased activity tolerance, Decreased cognition, Decreased endurance, Decreased knowledge of use of DME, Decreased range of motion, Decreased skin integrity, Decreased strength, Impaired perceived functional ability, Impaired sensation, Impaired UE functional use, Improper body mechanics, Pain, Cardiopulmonary status limiting activity, Decreased balance, Decreased coordination, Decreased mobility, Difficulty walking, Impaired tone, Postural dysfunction  Visit Diagnosis: Muscle weakness (generalized)  Other lack of coordination  Difficulty in walking, not elsewhere classified  Repeated falls     Problem List Patient Active Problem List   Diagnosis Date Noted  . Sepsis ([place]) 10/18/2018   Bram A.  Rosana Hoes, SPT This entire session was performed under direct supervision and direction of a licensed therapist/therapist assistant . I have personally read, edited and approve of the note as  written.  Trotter,Margaret  PT, DPT 11/13/2018, 7:11 PM  [place] MAIN [facility] SERVICES [address] [place], [place], [address] Phone: [phone]   Fax:  [phone]  Name: Kenneth Summers MRN: [id number] Date of Birth: 25-May-1945

## 2018-11-13 NOTE — Therapy (Addendum)
[place] MAIN [facility] SERVICES [address] [place], [place], [address] Phone: [phone]   Fax:  [phone]  Occupational Therapy Treatment  Patient Details  Name: Kenneth Summers MRN: [id number] Date of Birth: 02/09/1945 No data recorded  Encounter Date: 11/13/2018  OT End of Session - 11/13/18 1433    Visit Number  37    Number of Visits  31   Date for OT Re-Evaluation  01/15/19    Authorization Type  Progress report period starting 10/30/2018    OT Start Time  1350    OT Stop Time  1420    OT Time Calculation (min)  30 min    Activity Tolerance  Patient tolerated treatment well    Behavior During Therapy  [facility] for tasks assessed/performed       Past Medical History:  Diagnosis Date  . Alcohol abuse   . APS (antiphospholipid syndrome) ([place])   . BPH (benign prostatic hyperplasia)   . CAD (coronary artery disease)   . Cellulitis   . Cervical spinal stenosis   . Chronic osteomyelitis ([place])   . CKD (chronic kidney disease)   . CKD (chronic kidney disease)   . Clostridium difficile diarrhea   . Depression   . Diplopia   . Fatigue   . GERD (gastroesophageal reflux disease)   . Hyperlipemia   . Hypertension   . Hypovitaminosis D   . IBS (irritable bowel syndrome)   . IgA deficiency ([place])   . Insomnia   . Left lumbosacral radiculopathy   . Moderate obstructive sleep apnea   . Osteomyelitis of foot ([place])   . PAD (peripheral artery disease) ([place])   . Pruritus   . RA (rheumatoid arthritis) ([place])   . Radiculopathy   . Restless legs syndrome   . RLS (restless legs syndrome)   . SLE (systemic lupus erythematosus related syndrome) ([place])   . Stroke ([place])   . Toxic maculopathy of both eyes     Past Surgical History:  Procedure Laterality Date  . CARDIAC CATHETERIZATION    . CAROTID ENDARTERECTOMY    . CERVICAL LAMINECTOMY    . CORONARY ANGIOPLASTY    . FRACTURE SURGERY    . fusion C5-6-7    . HERNIA REPAIR    . KNEE  ARTHROSCOPY    . TONSILLECTOMY      There were no vitals filed for this visit.  Subjective Assessment - 11/13/18 1431    Subjective   Pt. reports not sleeping well last night. Pt. reports that his wife in in rehab for hip surgery.    Patient is accompanied by:  Family member    Pertinent History  Pt. is a 73 y.o. 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 Pt. PMHx includes: Multiple Falls, Lupus, DJD, Renal Abscess, CVA. Pt. resides with his wife. Pt.'s wife and daughter assist with ADLs. Pt. has caregivers in  for 2 hours a day, 6 days a week. Pt. received Rehab services in acute care, at SNF for STR, and [facility]. Pt. is retired from [employer] for [employer] and [employer].    Patient Stated Goals  To regain use of his left UE, and do more for himself.    Currently in Pain?  Yes    Pain Score  --   Not rated   Pain Location  --   neck, bilateral shoulders, legs.     OT TREATMENT    Neuro  muscular re-education:  Pt. worked on reaching with the LUE using his trunk to forward flex for increased reach. Pt. worked on reaching out , and crossing midline to place the items on the table located at the right. Pt. required verbal cues, rest breaks, and encouragement.  Response to Treatment  Pt. did not sleep well last night, and had to take a sleeping pill. Pt's daughter reports that the pt. is very tired today from the affects of that. Pt. continues to present with limited Bilateral UE strength, and [facility] skills. Pt. required increased rest breaks, increased need for repositioning today. Pt. also reported that he hurt all over today with reports of discormfort in his neck, bilateral shoulders, and legs. Pt. conintues to work on improving UE functioning, and UE functional hand use during ADLs, and IADLs.                       OT Education - 11/13/18 1433    Education Details  LUE functioning, [place], trunk control    Person(s)  Educated  Patient    Methods  Explanation;Demonstration    Comprehension  Verbalized understanding;Returned demonstration;Verbal cues required          OT Long Term Goals - 10/28/18 1532      OT LONG TERM GOAL #1   Title  Pt. will increase left shoulder flexion AROM by 10 degrees to access cabinet/shelf.    Baseline  Pt. has difficulty reaching for items with the LUE secondary to decreased LUE ROM    Time  12    Period  Weeks    Status  On-going    Target Date  01/15/19      OT LONG TERM GOAL #2   Title  Pt. will donn a shirt with Supervision.    Baseline  Min    Time  12    Period  Weeks    Status  On-going    Target Date  01/15/19      OT LONG TERM GOAL #3   Title  Pt. will require ModA to perform LE dressing    Baseline  MaxA left, ModA with the right    Time  12    Period  Weeks    Status  On-going    Target Date  01/15/19      OT LONG TERM GOAL #4   Title  Pt. will improve left grip strength by 10# to be able to open a jar/container.    Baseline  Decreased grip since hospital readmission, pt. has difficulty opening containers    Time  12    Period  Weeks    Status  On-going    Target Date  01/15/19      OT LONG TERM GOAL #5   Title  Pt. will improve left hand [facility] skills to be able to assit with buttoning/zipping.    Baseline  Pt. has difficulty [facility] skills buttoning/zipping    Time  12    Period  Weeks    Status  On-going    Target Date  01/15/19      OT LONG TERM GOAL #6   Title  Pt. will demonstrate visual conmpensatory strategies during 100% of the time during ADLs    Baseline  Pt. requires cues for left sides awareness.    Time  12    Period  Weeks    Status  On-going    Target Date  01/15/19  OT LONG TERM GOAL #7   Title  Pt. will prepare a simple cold snack from w/c with supervision using cognitive compensatory strategies 100% of the time.    Baseline  Pt. is unable    Time  12    Period  Weeks    Target Date  11/27/18      OT LONG TERM  GOAL #8   Title  Pt. will accurately identify potential safety hazard using good safety awareness, and judgement 100% for ADLs, and IADLs.    Baseline  Limited    Time  12    Period  Weeks    Status  On-going    Target Date  11/27/18      OT LONG TERM GOAL  #9   TITLE  Pt. will  navigate his w/c around obstacles with Supervision and 100% accuracy    Baseline  Pt. requires cuing for obstacle on the left    Time  12    Period  Weeks    Status  On-going    Target Date  11/27/18            Plan - 11/13/18 1433    Clinical Impression Statement  Pt. did not sleep well last night, and had to take a sleeping pill. Pt's daughter reports that the pt. is very tired today from the affects of that. Pt. continues to present with limited Bilateral UE strength, and [facility] skills. Pt. required increased rest breaks, increased need for repositioning today. Pt. also reported that he hurt all over today with reports of discormfort in his neck, bilateral shoulders, and legs. Pt. conintues to work on improving UE functioning, and UE functional hand use during ADLs, and IADLs.    OT Occupational Profile and History  Problem Focused Assessment - Including review of records relating to presenting problem    Occupational performance deficits (Please refer to evaluation for details):  ADL's    Body Structure / Function / Physical Skills  UE functional use;Coordination;FMC;Dexterity;Strength;ROM    Cognitive Skills  Attention;Memory;Emotional;Problem Solve;Safety Awareness    Rehab Potential  Good    Clinical Decision Making  Several treatment options, min-mod task modification necessary    Comorbidities Affecting Occupational Performance:  Presence of comorbidities impacting occupational performance    Comorbidities impacting occupational performance description:  Phyical, cognitive, visual,  medical comorbidities    Modification or Assistance to Complete Evaluation   No modification of tasks or assist necessary  to complete eval    OT Frequency  3x / week    OT Duration  12 weeks    OT Treatment/Interventions  Self-care/ADL training;DME and/or AE instruction;Therapeutic exercise;Therapeutic activities;Moist Heat;Cognitive remediation/compensation;Neuromuscular education;Visual/perceptual remediation/compensation;Coping strategies training;Patient/family education;Passive range of motion;Psychosocial skills training;Energy conservation;Functional Mobility Training    Consulted and Agree with Plan of Care  Patient    Family Member Consulted  Daughter [name]       Patient will benefit from skilled therapeutic intervention in order to improve the following deficits and impairments:   Body Structure / Function / Physical Skills: UE functional use, Coordination, FMC, Dexterity, Strength, ROM Cognitive Skills: Attention, Memory, Emotional, Problem Solve, Safety Awareness     Visit Diagnosis: Muscle weakness (generalized)  Other lack of coordination    Problem List Patient Active Problem List   Diagnosis Date Noted  . Sepsis ([place]) 10/18/2018    Harrel Carina, MS, OTR/L 11/13/2018, 2:43 PM  [place] MAIN [facility] SERVICES [address] [place], [place], [address] Phone:  [phone]   Fax:  [phone]  Name: Kenneth Summers MRN: [id number] Date of Birth: 10/03/1945

## 2018-11-14 ENCOUNTER — Other Ambulatory Visit: Payer: Self-pay

## 2018-11-14 ENCOUNTER — Encounter: Payer: Self-pay | Admitting: Occupational Therapy

## 2018-11-14 ENCOUNTER — Ambulatory Visit: Payer: Medicare Other | Admitting: Occupational Therapy

## 2018-11-14 ENCOUNTER — Ambulatory Visit: Payer: Medicare Other | Admitting: Physical Therapy

## 2018-11-14 DIAGNOSIS — M6281 Muscle weakness (generalized): Secondary | ICD-10-CM | POA: Diagnosis not present

## 2018-11-14 DIAGNOSIS — R278 Other lack of coordination: Secondary | ICD-10-CM

## 2018-11-14 NOTE — Therapy (Addendum)
[place] MAIN [facility] SERVICES [address] [place], [place], [address] Phone: [phone]   Fax:  [phone]  Occupational Therapy Treatment  Patient Details  Name: Kenneth Summers MRN: [id number] Date of Birth: 11-Oct-1945 No data recorded  Encounter Date: 11/14/2018  OT End of Session - 11/14/18 1452    Visit Number  93    Number of Visits  71    Date for OT Re-Evaluation  01/15/19    Authorization Type  Progress report period starting 10/30/2018    OT Start Time  1300    OT Stop Time  1345    OT Time Calculation (min)  45 min    Activity Tolerance  Patient tolerated treatment well    Behavior During Therapy  [facility] for tasks assessed/performed       Past Medical History:  Diagnosis Date  . Alcohol abuse   . APS (antiphospholipid syndrome) ([place])   . BPH (benign prostatic hyperplasia)   . CAD (coronary artery disease)   . Cellulitis   . Cervical spinal stenosis   . Chronic osteomyelitis ([place])   . CKD (chronic kidney disease)   . CKD (chronic kidney disease)   . Clostridium difficile diarrhea   . Depression   . Diplopia   . Fatigue   . GERD (gastroesophageal reflux disease)   . Hyperlipemia   . Hypertension   . Hypovitaminosis D   . IBS (irritable bowel syndrome)   . 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 Insomnia   . Left lumbosacral radiculopathy   . Moderate obstructive sleep apnea   . Osteomyelitis of foot ([place])   . PAD (peripheral artery disease) ([place])   . Pruritus   . RA (rheumatoid arthritis) ([place])   . Radiculopathy   . Restless legs syndrome   . RLS (restless legs syndrome)   . SLE (systemic lupus erythematosus related syndrome) ([place])   . Stroke ([place])   . Toxic maculopathy of both eyes     Past Surgical History:  Procedure Laterality Date  . CARDIAC CATHETERIZATION    . CAROTID ENDARTERECTOMY    . CERVICAL LAMINECTOMY    . CORONARY ANGIOPLASTY    . FRACTURE SURGERY    . fusion C5-6-7    . HERNIA REPAIR    . KNEE  ARTHROSCOPY    . TONSILLECTOMY      There were no vitals filed for this visit.  Subjective Assessment - 11/14/18 1451    Subjective   Pt. reports feeling better, and sleeping well last night.    Patient is accompanied by:  Kenneth Summers    Pertinent History  Pt. is a 73 y.o. 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 Pt. PMHx includes: Multiple Falls, Lupus, DJD, Renal Abscess, CVA. Pt. resides with his wife. Pt.'s wife and daughter assist with ADLs. Pt. has caregivers in  for 2 hours a day, 6 days a week. Pt. received Rehab services in acute care, at SNF for STR, and [place] services. Pt. is retired from [employer] for [employer] and [employer].    Patient Stated Goals  To regain use of his left UE, and do more for himself.    Currently in Pain?  No/denies      OT TREATMENT    Neuro muscular re-education:  Pt. worked on using his LUE while reaching for letters positioned randomly on a vertical surface while forward flexing, and rotating his trunk. Pt. worked on Environmental health practitioner  scanning to the left, and worked on grasping the letters and creating 4 letter words. Pt. worked on creating a 4 letter word, a six letter word, and a 9 letter word. Pt. reported being very proud of himself for completing a 9 letter word.    Self-care:  Pt. worked on UE dressing skills using one armed UE dressing techniques. Pt. required mod A, and cues for step, by step instruction. Pt. Initially attempted to put the jacket on backwards.  Response to Treatment:  Pt. reports sleeping better last night. Pt. was more engaged in the session, and reported the fact that he was able locate, and create a 6 letter word, and a 9 letter word when visually scanning, and reaching for 4 letter words. Pt. required verbal cues for forward flexion with the trunk, and trunk rotation. Pt. required step-by-step cues for initiating UE dressing, the proper direction of the jacket, threading the LUE, reaching  with the right around the back of the neck, and pushing the RUE through. Pt. requires assist to pull the sweater down in the back. Pt. continues to work on improving LUE functioning in order to improve ADL, and IADL functioning.                      OT Education - 11/14/18 1452    Education Details  LUE functioning, [place], trunk control    Person(s) Educated  Patient    Methods  Explanation;Demonstration    Comprehension  Verbalized understanding;Returned demonstration;Verbal cues required          OT Long Term Goals - 10/28/18 1532      OT LONG TERM GOAL #1   Title  Pt. will increase left shoulder flexion AROM by 10 degrees to access cabinet/shelf.    Baseline  Pt. has difficulty reaching for items with the LUE secondary to decreased LUE ROM    Time  12    Period  Weeks    Status  On-going    Target Date  01/15/19      OT LONG TERM GOAL #2   Title  Pt. will donn a shirt with Supervision.    Baseline  Min    Time  12    Period  Weeks    Status  On-going    Target Date  01/15/19      OT LONG TERM GOAL #3   Title  Pt. will require ModA to perform LE dressing    Baseline  MaxA left, ModA with the right    Time  12    Period  Weeks    Status  On-going    Target Date  01/15/19      OT LONG TERM GOAL #4   Title  Pt. will improve left grip strength by 10# to be able to open a jar/container.    Baseline  Decreased grip since hospital readmission, pt. has difficulty opening containers    Time  12    Period  Weeks    Status  On-going    Target Date  01/15/19      OT LONG TERM GOAL #5   Title  Pt. will improve left hand [facility] skills to be able to assit with buttoning/zipping.    Baseline  Pt. has difficulty [facility] skills buttoning/zipping    Time  12    Period  Weeks    Status  On-going    Target Date  01/15/19      OT LONG TERM  GOAL #6   Title  Pt. will demonstrate visual conmpensatory strategies during 100% of the time during ADLs    Baseline  Pt. requires  cues for left sides awareness.    Time  12    Period  Weeks    Status  On-going    Target Date  01/15/19      OT LONG TERM GOAL #7   Title  Pt. will prepare a simple cold snack from w/c with supervision using cognitive compensatory strategies 100% of the time.    Baseline  Pt. is unable    Time  12    Period  Weeks    Target Date  11/27/18      OT LONG TERM GOAL #8   Title  Pt. will accurately identify potential safety hazard using good safety awareness, and judgement 100% for ADLs, and IADLs.    Baseline  Limited    Time  12    Period  Weeks    Status  On-going    Target Date  11/27/18      OT LONG TERM GOAL  #9   TITLE  Pt. will  navigate his w/c around obstacles with Supervision and 100% accuracy    Baseline  Pt. requires cuing for obstacle on the left    Time  12    Period  Weeks    Status  On-going    Target Date  11/27/18            Plan - 11/14/18 1452    Clinical Impression Statement  Pt. reports sleeping better last night. Pt. was more engaged in the session, and reported the fact that he was able locate, and create a 6 letter word, and a 9 letter word when visually scanning, and reaching for 4 letter words. Pt. required verbal cues for forward flexion with the trunk, and trunk rotation. Pt. required step-by-step cues for initiating UE dressing, the proper direction of the jacket, threading the LUE, reaching with the right around the back of the neck, and pushing the RUE through. Pt. requires assist to pull the sweater down in the back. Pt. continues to work on improving LUE functioning in order to improve ADL, and IADL functioning.    OT Occupational Profile and History  Problem Focused Assessment - Including review of records relating to presenting problem    Occupational performance deficits (Please refer to evaluation for details):  ADL's    Body Structure / Function / Physical Skills  UE functional use;Coordination;FMC;Dexterity;Strength;ROM    Cognitive Skills   Attention;Memory;Emotional;Problem Solve;Safety Awareness    Rehab Potential  Good    Clinical Decision Making  Several treatment options, min-mod task modification necessary    Comorbidities Affecting Occupational Performance:  Presence of comorbidities impacting occupational performance    Comorbidities impacting occupational performance description:  Phyical, cognitive, visual,  medical comorbidities    Modification or Assistance to Complete Evaluation   No modification of tasks or assist necessary to complete eval    OT Frequency  3x / week    OT Duration  12 weeks    OT Treatment/Interventions  Self-care/ADL training;DME and/or AE instruction;Therapeutic exercise;Therapeutic activities;Moist Heat;Cognitive remediation/compensation;Neuromuscular education;Visual/perceptual remediation/compensation;Coping strategies training;Patient/Kenneth education;Passive range of motion;Psychosocial skills training;Energy conservation;Functional Mobility Training    Recommended Other Services  PT    Consulted and Agree with Plan of Care  Patient    Kenneth Summers Consulted  Daughter [name]       Patient will benefit from skilled therapeutic intervention in  order to improve the following deficits and impairments:   Body Structure / Function / Physical Skills: UE functional use, Coordination, FMC, Dexterity, Strength, ROM Cognitive Skills: Attention, Memory, Emotional, Problem Solve, Safety Awareness     Visit Diagnosis: Muscle weakness (generalized)  Other lack of coordination    Problem List Patient Active Problem List   Diagnosis Date Noted  . Sepsis ([place]) 10/18/2018    Harrel Carina, MS, OTR/L 11/14/2018, 4:29 PM  [place] MAIN [facility] SERVICES [address] [place], [place], [address] Phone: [phone]   Fax:  [phone]  Name: RISHON SALIB MRN: [id number] Date of Birth: [birth year]

## 2018-11-18 ENCOUNTER — Ambulatory Visit: Payer: Medicare Other | Admitting: Occupational Therapy

## 2018-11-18 ENCOUNTER — Other Ambulatory Visit: Payer: Self-pay

## 2018-11-18 ENCOUNTER — Encounter: Payer: Self-pay | Admitting: Occupational Therapy

## 2018-11-18 ENCOUNTER — Ambulatory Visit: Payer: Medicare Other

## 2018-11-18 VITALS — BP 109/61 | HR 70

## 2018-11-18 DIAGNOSIS — M6281 Muscle weakness (generalized): Secondary | ICD-10-CM

## 2018-11-18 DIAGNOSIS — R278 Other lack of coordination: Secondary | ICD-10-CM

## 2018-11-18 DIAGNOSIS — R262 Difficulty in walking, not elsewhere classified: Secondary | ICD-10-CM

## 2018-11-18 NOTE — Therapy (Signed)
[place] MAIN [facility] SERVICES [address] [place], [place], [address] Phone: [phone]   Fax:  [phone]  Physical Therapy Progress Note  Dates of reporting period  10/24/18   to   11/18/18   Patient Details  Name: Kenneth Summers MRN: [id number] Date of Birth: 1945-05-25 Referring Provider (PT): Dr. [name], Guy Begin   Encounter Date: 11/18/2018  PT End of Session - 11/18/18 1759    Visit Number  60    Number of Visits  78    Date for PT Re-Evaluation  12/04/18    Authorization Type  Medicare reporting period starting 09/11/18    Authorization Time Period  Current cert period 0/02/270-53/66/44    PT Start Time  1300    PT Stop Time  1346    PT Time Calculation (min)  46 min    Equipment Utilized During Treatment  Gait belt    Activity Tolerance  Patient tolerated treatment well    Behavior During Therapy  [facility] for tasks assessed/performed       Past Medical History:  Diagnosis Date  . Alcohol abuse   . APS (antiphospholipid syndrome) ([place])   . BPH (benign prostatic hyperplasia)   . CAD (coronary artery disease)   . Cellulitis   . Cervical spinal stenosis   . Chronic osteomyelitis ([place])   . CKD (chronic kidney disease)   . CKD (chronic kidney disease)   . Clostridium difficile diarrhea   . Depression   . Diplopia   . Fatigue   . GERD (gastroesophageal reflux disease)   . Hyperlipemia   . Hypertension   . Hypovitaminosis D   . IBS (irritable bowel syndrome)   . IgA deficiency ([place])   . Insomnia   . Left lumbosacral radiculopathy   . Moderate obstructive sleep apnea   . Osteomyelitis of foot ([place])   . PAD (peripheral artery disease) ([place])   . Pruritus   . RA (rheumatoid arthritis) ([place])   . Radiculopathy   . Restless legs syndrome   . RLS (restless legs syndrome)   . SLE (systemic lupus erythematosus related syndrome) ([place])   . Stroke ([place])   . Toxic maculopathy of both eyes     Past Surgical History:   Procedure Laterality Date  . CARDIAC CATHETERIZATION    . CAROTID ENDARTERECTOMY    . CERVICAL LAMINECTOMY    . CORONARY ANGIOPLASTY    . FRACTURE SURGERY    . fusion C5-6-7    . HERNIA REPAIR    . KNEE ARTHROSCOPY    . TONSILLECTOMY      Vitals:   11/18/18 1308  BP: 109/61  Pulse: 70    Subjective Assessment - 11/18/18 1536    Subjective  Pt reports he is doing well today. Denies any soreness or pain this date. No new questions or concerns at this time.    Patient is accompained by:  Family member    Pertinent History  Patient is a 73 y.o. male who presents to outpatient physical therapy with a referral for medical diagnosis of CVA. This patient's chief complaints consist of left hemiplegia and overall deconditioning leading to the following functional deficits: dependent for ADLs, IADLs, unable to transfer to car, dependent transfers at home with hoyer lift, unable to walk or stand without significant assistance, difficulty with W/C navigation..    Limitations  Sitting;Lifting;Standing;House hold activities;Writing;Walking;Other (comment)    Diagnostic tests  Brain MRI 11/01/2017: IMPRESSION: 1. Acute right ACA territory infarct  as demonstrated on prior CT imaging. No intracranial hemorrhage or significant mass effect. 2. 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    Currently in Pain?  No/denies         TREATMENT   Modified [employer] and Roll test performed in [facility], both negative.  Seated forward reach was reassessed today, with the ability to reach 14.5" today.  STS transfers x3 using parallel bar for BUE support, PT on pt's right side and SPT on pt's left side providing min/modA to come to stand from [facility].  A bolster was utlized during the last set to improve knee valgus collapse from occurring.  Mod VC were provided throughout for technique to pull his hips forward and stand up tall.  CGA with occasional minA was provided once in standing.  He stood for 30s for the first and 2nd set and 1:15 during 3rd set.   Seated on edge of therapy mat:  Seated hip flexion x10 bilaterally  Seated knee extension x10 bilaterally  Seated hip abduction with manual resistance x10  Seated hip adduction with manual resistance x10  Forward reaching x5    Pt educated throughout session about proper posture and technique with exercises. Improved exercise technique, movement at target joints, use of target muscles after min to mod verbal, visual, tactile cues.  Pt continues to display good motivation throughout PT sessions. He has met 1short term goaland 1 long term goalto date.  His daughter reports that he has made improvements with his bed mobility, stating that he does about 70% of the work if he is in an optimal position, and that his rolling is improving, however he still requires a hoyer lift to sit edge of bed.   In PT sessions, he is able to complete a STS from his power WC at the edge of the // bars, utilizing the bar to help pull himself into a stand, with min/modA+2, and additionally completes a squat pivot transfer from a power WC to the mat table with maxAx1 or modA+2.  He is unable to safely ambulate with a RW at time time. He is also unable to safely perform stairs at this time.He has made improvements in being able to anteriorly weight shift, demonstrating the ability to reach 14.5" in sitting while maintaining his balance today.  Pt will benefit from continued skilled PT services in order to increase strength and functional mobilityin order to decrease caregiver burden, improve his ability to perform ADLs, and to improve his QOL.          PT Short Term Goals - 11/18/18 1307      PT SHORT TERM GOAL #1   Title  Be independent with initial home exercise program for self-management of symptoms.    Baseline  HEP to be given at second session; advice to practice unsupported sitting at home (06/19/2018); 07/08/18: Performing  at home, 6/29: needs assistance but is doing exercise program regularly; 08/22/18: adherent;    Time  2    Period  Weeks    Status  Achieved    Target Date  06/26/18        PT Long Term Goals - 11/18/18 1309      PT LONG TERM GOAL #1   Title  Patient will complete all bed mobility with min A to improve functional independence for getting in and out of bed and adjusting in bed.     Baseline  max A - total A reported by  family (06/19/2018); 07/08/18: still requires maxA, dtr reports improvement in rolling since starting therapy; 6/23: min A to supervision in clinic; not doing at home right; 8/5: Pt's daughter states that his rolling has improved, but still has difficulty with all other bed mobility; 10/23/18: pts daughter states that she is doing about 35% of the work if he is in proper position, he does uses the bed rails to help roll;  using Hoyer for coming to sit EOB; 10/12: pts daughter states that she is doing about 30% of the work if he is in proper position, he does uses the bed rails to help roll, but it is improving;  using Hoyer for coming to sit EOB    Time  12    Period  Weeks    Status  Partially Met    Target Date  12/04/18      PT LONG TERM GOAL #2   Title  Patient will complete sit <> stand transfer chair to chair with Min A and LRAD to improve functional independence for household and community mobility.     Baseline  max A +2 STS at W/C (06/12/2018); max A +2-3 STS or stand pivot transfer, able to complete more reps (06/19/2018); 07/08/18: not safe to attempt at this time; 09/11/18: requires maxA+1 or modA+2 for STSs and squat pivot transfer; 10/23/18: requires maxA+1 or modA+2 for STSs and squat pivot transfer; 10/12: requires maxA+1 or modA+2 for STSs and squat pivot transfer    Time  12    Period  Weeks    Status  Partially Met    Target Date  12/04/18      PT LONG TERM GOAL #3   Title  Patient will navigate power w/c with min A x 100 feet to improve mobility for household and  short community distances.    Baseline  required total A (06/12/2018); able to roll 20 feet with min A (06/20/2018); 07/08/18: Pt can go approximately 10' without assist per family;  10/23/2018: pt's daughter states that his power WC mobility has become 100% better with the new hand control, slowed speed, and with practice; 10/12: pt's daughter reports that he can perform household WC mobility and limited community ambulation with minA    Time  12    Period  Weeks    Status  Achieved      PT LONG TERM GOAL #4   Title  Patient will complete car transfers W/C to family SUV with min A using LRAD to improve his abilty to participate in community activities.     Baseline  unable to complete car transfers at all (06/12/2018); unable to complete car transfers at all (06/19/2018).; 07/08/18: Unable to perform at this time; 09/11/18: unable to perform at this time; 10/23/2018: unable to complete car transfers at all; 10/12:  unable to complete car transfers at all    Time  12    Period  Weeks    Status  On-going      PT LONG TERM GOAL #5   Title  Patient will ambulate at least 150 feet with mod I using LRAD to improve mobility for household and community distances.     Baseline  unable to take steps (06/12/2018); able to shift R leg forward and back with max A and RW at edge of plinth (06/19/2018); 07/08/18: unable to ambulate at this time. 09/11/18: unable to perform at this time; 10/23/2018: unable to perform at this time; 10/12: unable to perform at this time  Time  12    Period  Weeks    Status  On-going      PT LONG TERM GOAL #6   Title  Patient will navigate 4 steps with min A and BUE support to improve his ability to access community and social participation.     Baseline  unable to complet stairs (06/12/2018); unable to complete stairs (06/19/2018); 07/08/18: unable to attempt at this time; 09/11/18: unsafe to attempt at this time; 10/23/2018: unable to complet stairs (06/12/2018); unable to complete stairs (06/19/2018);  07/08/18: unable to attempt at this time; 09/11/18: unsafe to attempt at this time; 10/23/18: unsafe to attempt at this time    Time  12    Period  Weeks    Status  On-going      PT LONG TERM GOAL #7   Title  Patient will increase functional reach test to >15 inches in sitting to exhibit improved sitting balance and positioning and reduce fall risk;    Baseline  07/30/18: 12 inches; 09/11/18: 10-12 inches; 10/23/18: 12-13 inch; 10/12: 14.5"    Time  4    Period  Weeks    Status  Partially Met    Target Date  12/04/18            Plan - 11/18/18 1831    Clinical Impression Statement  Pt continues to display good motivation throughout PT sessions.  He has met 1 short term goal and 1 long term goal to date.  His daughter reports that he has made improvements with his bed mobility, stating that he does about 70% of the work if he is in an optimal position, and that his rolling is improving, however he still requires a hoyer lift to sit edge of bed.   In PT sessions, he is able to complete a STS from his power WC at the edge of the // bars, utilizing the bar to help pull himself into a stand, with min/modA+2, and additionally completes a squat pivot transfer from a power WC to the mat table with maxAx1 or modA+2.  He is unable to safely ambulate with a RW at time time.  He is also unable to safely perform stairs at this time.  He has made improvements in being able to anteriorly weight shift, demonstrating the ability to reach 14.5" in sitting while maintaining his balance today.  Pt will benefit from continued skilled PT services in order to increase strength and functional mobility in order to decrease caregiver burden, improve his ability to perform ADLs, and to improve his QOL.    Personal Factors and Comorbidities  Age;Comorbidity 3+    Comorbidities  Relevant past medical history and comorbidities include long term steroid use for lupus, CKD, chronic osteomyelitis, cervical spine stenosis, BPH, APS,  alcohol abuse, peripheral artery disease, depression, GERD, obstructive sleep apnea, HTN, IBS, IgA deficiency, lumbar radiculopathy, RA, Stroke, toxic maculopathy in both eyes, systemic lupus erythematosus related syndrome, cardiac catheterization, carotid endarterectomy, cervical laminectomy, coronary angioplasty, Fusion C5-C7, knee arthroplasty, lumbar surgery.    Examination-Activity Limitations  Bed Mobility;Bend;Sit;Toileting;Stand;Stairs;Lift;Transfers;Squat;Locomotion Level;Carry;Dressing;Hygiene/Grooming;Continence    Examination-Participation Restrictions  Yard Work;Interpersonal Relationship;Community Activity    Stability/Clinical Decision Making  Evolving/Moderate complexity    Rehab Potential  Fair    PT Frequency  3x / week    PT Duration  12 weeks    PT Treatment/Interventions  ADLs/Self Care Home Management;Electrical Stimulation;Moist Heat;Cryotherapy;Gait training;Stair training;Functional mobility training;Therapeutic exercise;Balance training;Neuromuscular re-education;Cognitive remediation;Patient/family education;Orthotic Fit/Training;Wheelchair mobility training;Manual techniques;Passive range of motion;Energy conservation;Joint  Manipulations    PT Next Visit Plan  Work on strength, sitting/standing balance, functional activities such as rolling, bed mobility, transfers; caregiver training    PT Home Exercise Plan  handout provided via Beth during previous sessions    Consulted and Agree with Plan of Care  Patient;Family member/caregiver    Family Member Consulted  daughter [name]       Patient will benefit from skilled therapeutic intervention in order to improve the following deficits and impairments:  Abnormal gait, Decreased activity tolerance, Decreased cognition, Decreased endurance, Decreased knowledge of use of DME, Decreased range of motion, Decreased skin integrity, Decreased strength, Impaired perceived functional ability, Impaired sensation, Impaired UE functional  use, Improper body mechanics, Pain, Cardiopulmonary status limiting activity, Decreased balance, Decreased coordination, Decreased mobility, Difficulty walking, Impaired tone, Postural dysfunction  Visit Diagnosis: Muscle weakness (generalized)  Other lack of coordination  Difficulty in walking, not elsewhere classified     Problem List Patient Active Problem List   Diagnosis Date Noted  . Sepsis ([place]) 10/18/2018    This entire session was performed under direct supervision and direction of a licensed therapist/therapist assistant . I have personally read, edited and approve of the note as written.   Lutricia Horsfall, SPT Phillips Grout PT, DPT, GCS  Huprich,Jason 11/19/2018, 4:25 PM  [place] MAIN [facility] SERVICES [address] [place], [place], [address] Phone: [phone]   Fax:  [phone]  Name: RAMEL TOBON MRN: [id number] Date of Birth: Oct 19, 1945

## 2018-11-18 NOTE — Therapy (Addendum)
[place] MAIN [facility] SERVICES [address] [place], [place], [address] Phone: [phone]   Fax:  [phone]  Occupational Therapy Treatment  Patient Details  Name: Kenneth Summers MRN: [id number] Date of Birth: 1945/05/13 No data recorded  Encounter Date: 11/18/2018  OT End of Session - 11/18/18 1512    Visit Number  64    Number of Visits  54   Date for OT Re-Evaluation  01/15/19    Authorization Type  Progress report period starting 10/30/2018    OT Start Time  1350    OT Stop Time  1430    OT Time Calculation (min)  40 min    Activity Tolerance  Patient tolerated treatment well    Behavior During Therapy  [facility] for tasks assessed/performed       Past Medical History:  Diagnosis Date  . Alcohol abuse   . APS (antiphospholipid syndrome) ([place])   . BPH (benign prostatic hyperplasia)   . CAD (coronary artery disease)   . Cellulitis   . Cervical spinal stenosis   . Chronic osteomyelitis ([place])   . CKD (chronic kidney disease)   . CKD (chronic kidney disease)   . Clostridium difficile diarrhea   . Depression   . Diplopia   . Fatigue   . GERD (gastroesophageal reflux disease)   . Hyperlipemia   . Hypertension   . Hypovitaminosis D   . IBS (irritable bowel syndrome)   . 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 Insomnia   . Left lumbosacral radiculopathy   . Moderate obstructive sleep apnea   . Osteomyelitis of foot ([place])   . PAD (peripheral artery disease) ([place])   . Pruritus   . RA (rheumatoid arthritis) ([place])   . Radiculopathy   . Restless legs syndrome   . RLS (restless legs syndrome)   . SLE (systemic lupus erythematosus related syndrome) ([place])   . Stroke ([place])   . Toxic maculopathy of both eyes     Past Surgical History:  Procedure Laterality Date  . CARDIAC CATHETERIZATION    . CAROTID ENDARTERECTOMY    . CERVICAL LAMINECTOMY    . CORONARY ANGIOPLASTY    . FRACTURE SURGERY    . fusion C5-6-7    . HERNIA REPAIR    . KNEE  ARTHROSCOPY    . TONSILLECTOMY      There were no vitals filed for this visit.  Subjective Assessment - 11/18/18 1512    Subjective   Pt. reports feeling better, and sleeping well last night.    Patient is accompanied by:  Family member    Pertinent History  Pt. is a 73 y.o. 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 Pt. PMHx includes: Multiple Falls, Lupus, DJD, Renal Abscess, CVA. Pt. resides with his wife. Pt.'s wife and daughter assist with ADLs. Pt. has caregivers in  for 2 hours a day, 6 days a week. Pt. received Rehab services in acute care, at SNF for STR, and [place] services. Pt. is retired from [employer] for [employer] and [employer].    Currently in Pain?  Yes    Pain Score  9     Pain Location  Abdomen    Pain Orientation  Left    Pain Descriptors / Indicators  Aching    Pain Type  Acute pain    Pain Onset  1 to 4 weeks ago      OT TREATMENT    Neuro  muscular re-education:  Pt. worked on using his left hand for grasping 1/2" small pegs, and following a small design pattern when placing them onto a small pegboard.  Pt. required the pegboard to be repositioned diagonally.  Selfcare:  Pt. worked on UE dressing tasks using hemi-dressing techniques. Pt. requires increased cues for position when initially to donning a zip down sweatshirt, as pt. Attempts to donn the jacket backwards. Pt. required step-by step verbal, and tactile cues.    Response to Treatment:  Pt. reports having 9/10 left sided abdominal/trunk pain when sitting upright in his chair. Pt. reports having this pain over the weekend. Pt. requires visual, verbal, and tactile cues for UE dressing techniques, as pt. attempts to place the shirt on backwards. Pt. requires the use of mirrors, and step by-step cues to complete donning a shirt. Pt. continues to require cues to engage his LUE hand during [facility] tasks.                       OT Education - 11/18/18  1515    Education Details  LUE functioning, [place], UE dressing    Person(s) Educated  Patient    Methods  Explanation;Demonstration    Comprehension  Returned demonstration;Tactile cues required;Verbalized understanding;Need further instruction          OT Long Term Goals - 10/28/18 1532      OT LONG TERM GOAL #1   Title  Pt. will increase left shoulder flexion AROM by 10 degrees to access cabinet/shelf.    Baseline  Pt. has difficulty reaching for items with the LUE secondary to decreased LUE ROM    Time  12    Period  Weeks    Status  On-going    Target Date  01/15/19      OT LONG TERM GOAL #2   Title  Pt. will donn a shirt with Supervision.    Baseline  Min    Time  12    Period  Weeks    Status  On-going    Target Date  01/15/19      OT LONG TERM GOAL #3   Title  Pt. will require ModA to perform LE dressing    Baseline  MaxA left, ModA with the right    Time  12    Period  Weeks    Status  On-going    Target Date  01/15/19      OT LONG TERM GOAL #4   Title  Pt. will improve left grip strength by 10# to be able to open a jar/container.    Baseline  Decreased grip since hospital readmission, pt. has difficulty opening containers    Time  12    Period  Weeks    Status  On-going    Target Date  01/15/19      OT LONG TERM GOAL #5   Title  Pt. will improve left hand [facility] skills to be able to assit with buttoning/zipping.    Baseline  Pt. has difficulty [facility] skills buttoning/zipping    Time  12    Period  Weeks    Status  On-going    Target Date  01/15/19      OT LONG TERM GOAL #6   Title  Pt. will demonstrate visual conmpensatory strategies during 100% of the time during ADLs    Baseline  Pt. requires cues for left sides awareness.    Time  12    Period  Weeks    Status  On-going    Target Date  01/15/19      OT LONG TERM GOAL #7   Title  Pt. will prepare a simple cold snack from w/c with supervision using cognitive compensatory strategies 100% of the time.     Baseline  Pt. is unable    Time  12    Period  Weeks    Target Date  11/27/18      OT LONG TERM GOAL #8   Title  Pt. will accurately identify potential safety hazard using good safety awareness, and judgement 100% for ADLs, and IADLs.    Baseline  Limited    Time  12    Period  Weeks    Status  On-going    Target Date  11/27/18      OT LONG TERM GOAL  #9   TITLE  Pt. will  navigate his w/c around obstacles with Supervision and 100% accuracy    Baseline  Pt. requires cuing for obstacle on the left    Time  12    Period  Weeks    Status  On-going    Target Date  11/27/18            Plan - 11/18/18 1513    Clinical Impression Statement  Pt. reports having 9/10 left sided abdominal/trunk pain when sitting upright in his chair. Pt. reports having this pain over the weekend. Pt. requires visual, verbal, and tactile cues for UE dressing techniques, as pt. attempts to place the shirt on backwards. Pt. requires the use of mirrors, and step by-step cues to complete donning a shirt. Pt. continues to require cues to engage his LUE hand during [facility] tasks.    OT Occupational Profile and History  Problem Focused Assessment - Including review of records relating to presenting problem    Occupational performance deficits (Please refer to evaluation for details):  ADL's    Body Structure / Function / Physical Skills  UE functional use;Coordination;FMC;Dexterity;Strength;ROM    Cognitive Skills  Attention;Memory;Emotional;Problem Solve;Safety Awareness    Rehab Potential  Good    Clinical Decision Making  Several treatment options, min-mod task modification necessary    Comorbidities Affecting Occupational Performance:  Presence of comorbidities impacting occupational performance    Comorbidities impacting occupational performance description:  Phyical, cognitive, visual,  medical comorbidities    Modification or Assistance to Complete Evaluation   No modification of tasks or assist necessary to  complete eval    OT Frequency  3x / week    OT Duration  12 weeks    OT Treatment/Interventions  Self-care/ADL training;DME and/or AE instruction;Therapeutic exercise;Therapeutic activities;Moist Heat;Cognitive remediation/compensation;Neuromuscular education;Visual/perceptual remediation/compensation;Coping strategies training;Patient/family education;Passive range of motion;Psychosocial skills training;Energy conservation;Functional Mobility Training    Consulted and Agree with Plan of Care  Patient    Family Member Consulted  Daughter [name]       Patient will benefit from skilled therapeutic intervention in order to improve the following deficits and impairments:   Body Structure / Function / Physical Skills: UE functional use, Coordination, FMC, Dexterity, Strength, ROM Cognitive Skills: Attention, Memory, Emotional, Problem Solve, Safety Awareness     Visit Diagnosis: Muscle weakness (generalized)  Other lack of coordination    Problem List Patient Active Problem List   Diagnosis Date Noted  . Sepsis ([place]) 10/18/2018    Harrel Carina, MS, OTR/L 11/18/2018, 4:56 PM  [place] MAIN [facility] SERVICES [address] [place], [place], [address]  Phone: [phone]   Fax:  [phone]  Name: LITTLETON IBARA MRN: [id number] Date of Birth: 07-26-1945

## 2018-11-20 ENCOUNTER — Ambulatory Visit: Payer: Medicare Other | Admitting: Occupational Therapy

## 2018-11-20 ENCOUNTER — Encounter: Payer: Self-pay | Admitting: Occupational Therapy

## 2018-11-20 ENCOUNTER — Ambulatory Visit: Payer: Medicare Other

## 2018-11-20 ENCOUNTER — Other Ambulatory Visit: Payer: Self-pay

## 2018-11-20 VITALS — BP 108/57 | HR 69

## 2018-11-20 DIAGNOSIS — R278 Other lack of coordination: Secondary | ICD-10-CM

## 2018-11-20 DIAGNOSIS — M6281 Muscle weakness (generalized): Secondary | ICD-10-CM | POA: Diagnosis not present

## 2018-11-20 DIAGNOSIS — R262 Difficulty in walking, not elsewhere classified: Secondary | ICD-10-CM

## 2018-11-20 NOTE — Therapy (Signed)
[place] MAIN [facility] SERVICES [address] [place], [place], [address] Phone: [phone]   Fax:  [phone]  Physical Therapy Treatment  Patient Details  Name: Kenneth Summers MRN: [id number] Date of Birth: 30-Nov-1945 Referring Provider (PT): Dr. [name], Guy Begin   Encounter Date: 11/20/2018  PT End of Session - 11/20/18 1403    Visit Number  61    Number of Visits  73    Date for PT Re-Evaluation  12/04/18    Authorization Type  Medicare reporting period starting 09/11/18    Authorization Time Period  Current cert period 05/09/5954-38/75/64    PT Start Time  1300    PT Stop Time  1345    PT Time Calculation (min)  45 min    Equipment Utilized During Treatment  Gait belt    Activity Tolerance  Patient tolerated treatment well    Behavior During Therapy  [facility] for tasks assessed/performed       Past Medical History:  Diagnosis Date  . Alcohol abuse   . APS (antiphospholipid syndrome) ([place])   . BPH (benign prostatic hyperplasia)   . CAD (coronary artery disease)   . Cellulitis   . Cervical spinal stenosis   . Chronic osteomyelitis ([place])   . CKD (chronic kidney disease)   . CKD (chronic kidney disease)   . Clostridium difficile diarrhea   . Depression   . Diplopia   . Fatigue   . GERD (gastroesophageal reflux disease)   . Hyperlipemia   . Hypertension   . Hypovitaminosis D   . IBS (irritable bowel syndrome)   . IgA deficiency ([place])   . Insomnia   . Left lumbosacral radiculopathy   . Moderate obstructive sleep apnea   . Osteomyelitis of foot ([place])   . PAD (peripheral artery disease) ([place])   . Pruritus   . RA (rheumatoid arthritis) ([place])   . Radiculopathy   . Restless legs syndrome   . RLS (restless legs syndrome)   . SLE (systemic lupus erythematosus related syndrome) ([place])   . Stroke ([place])   . Toxic maculopathy of both eyes     Past Surgical History:  Procedure Laterality Date  . CARDIAC CATHETERIZATION    .  CAROTID ENDARTERECTOMY    . CERVICAL LAMINECTOMY    . CORONARY ANGIOPLASTY    . FRACTURE SURGERY    . fusion C5-6-7    . HERNIA REPAIR    . KNEE ARTHROSCOPY    . TONSILLECTOMY      Vitals:   11/20/18 1320  BP: (!) 108/57  Pulse: 69  SpO2: 100%    Subjective Assessment - 11/20/18 1307    Subjective  Pt reports he is doing well today. His daughter states that he had 2-3 episodes of "blacking out" yesterday, and that he was very fatigued all day, however states that he is doing well today. He has had these episodes in the past and his neurologist is aware. He states that his L shoulder has been bothering him since Saturday night, stating that it is worse when he is laying down.   No new questions or concerns at this time.    Patient is accompained by:  Family member    Pertinent History  Patient is a 73 y.o. male who presents to outpatient physical therapy with a referral for medical diagnosis of CVA. This patient's chief complaints consist of left hemiplegia and overall deconditioning leading to the following functional deficits: dependent for ADLs, IADLs, unable  to transfer to car, dependent transfers at home with hoyer lift, unable to walk or stand without significant assistance, difficulty with W/C navigation..    Limitations  Sitting;Lifting;Standing;House hold activities;Writing;Walking;Other (comment)    Diagnostic tests  Brain MRI 11/01/2017: IMPRESSION: 1. Acute right ACA territory infarct as demonstrated on prior CT imaging. No intracranial hemorrhage or significant mass effect. 2. 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    Currently in Pain?  Yes    Pain Score  5     Pain Location  Shoulder    Pain Orientation  Left    Pain Descriptors / Indicators  Aching    Pain Type  Acute pain    Pain Onset  More than a month ago    Pain Frequency  Intermittent        TREATMENT   STS transfers x 4 using parallel bar for BUE support, PT on pt's  left side and SPT on pt's right side providing min/modA to come to stand from [facility]. A bolster was utlized during the last set to improve knee valgus collapse from occurring.  Mod VC were provided throughout for technique to pull his hips forward and stand up tall.  CGA with occasional minA was provided once in standing. He stood for 10s for the first set, 40s during the 2nd set,  35s during the 3rd set, 30s during 3rd set, and 60s during the 4th set.  Seated in power wheelchair:  Seated hip flexion x10 bilaterally  Seated knee extension x10 bilaterally  Seated hip abduction with manual resistance x10  Seated hip adduction with manual resistance x10  Forward reaching x5 with each hand    Pt educated throughout session about proper posture and technique with exercises. Improved exercise technique, movement at target joints, use of target muscles after min to mod verbal, visual, tactile cues.   Patient displays good motivation throughout today's session.  He demonstrates improvement in his confidence with forward reaching today in sitting, however requires intermittent cueing with his STSs to shift his weight further forward.  He tolerated 4 stands today, with the longest being 1 minute. He does require cues for motivation with increased fatigue during interventions. Pt will benefit from continued skilled PT services in order to increase strength and functional mobilityin order to decrease caregiver burden, improve his ability to perform ADLs, and to improve his QOL.         PT Short Term Goals - 11/18/18 1307      PT SHORT TERM GOAL #1   Title  Be independent with initial home exercise program for self-management of symptoms.    Baseline  HEP to be given at second session; advice to practice unsupported sitting at home (06/19/2018); 07/08/18: Performing at home, 6/29: needs assistance but is doing exercise program regularly; 08/22/18: adherent;    Time  2    Period  Weeks     Status  Achieved    Target Date  06/26/18        PT Long Term Goals - 11/18/18 1309      PT LONG TERM GOAL #1   Title  Patient will complete all bed mobility with min A to improve functional independence for getting in and out of bed and adjusting in bed.     Baseline  max A - total A reported by family (06/19/2018); 07/08/18: still requires maxA, dtr reports improvement in rolling since starting therapy; 6/23: min A to supervision in clinic; not  doing at home right; 8/5: Pt's daughter states that his rolling has improved, but still has difficulty with all other bed mobility; 10/23/18: pts daughter states that she is doing about 35% of the work if he is in proper position, he does uses the bed rails to help roll;  using Harrel Lemon for coming to sit EOB; 10/12: pts daughter states that she is doing about 30% of the work if he is in proper position, he does uses the bed rails to help roll, but it is improving;  using Hoyer for coming to sit EOB    Time  12    Period  Weeks    Status  Partially Met    Target Date  12/04/18      PT LONG TERM GOAL #2   Title  Patient will complete sit <> stand transfer chair to chair with Min A and LRAD to improve functional independence for household and community mobility.     Baseline  max A +2 STS at W/C (06/12/2018); max A +2-3 STS or stand pivot transfer, able to complete more reps (06/19/2018); 07/08/18: not safe to attempt at this time; 09/11/18: requires maxA+1 or modA+2 for STSs and squat pivot transfer; 10/23/18: requires maxA+1 or modA+2 for STSs and squat pivot transfer; 10/12: requires maxA+1 or modA+2 for STSs and squat pivot transfer    Time  12    Period  Weeks    Status  Partially Met    Target Date  12/04/18      PT LONG TERM GOAL #3   Title  Patient will navigate power w/c with min A x 100 feet to improve mobility for household and short community distances.    Baseline  required total A (06/12/2018); able to roll 20 feet with min A (06/20/2018); 07/08/18: Pt can  go approximately 10' without assist per family;  10/23/2018: pt's daughter states that his power WC mobility has become 100% better with the new hand control, slowed speed, and with practice; 10/12: pt's daughter reports that he can perform household WC mobility and limited community ambulation with minA    Time  12    Period  Weeks    Status  Achieved      PT LONG TERM GOAL #4   Title  Patient will complete car transfers W/C to family SUV with min A using LRAD to improve his abilty to participate in community activities.     Baseline  unable to complete car transfers at all (06/12/2018); unable to complete car transfers at all (06/19/2018).; 07/08/18: Unable to perform at this time; 09/11/18: unable to perform at this time; 10/23/2018: unable to complete car transfers at all; 10/12:  unable to complete car transfers at all    Time  12    Period  Weeks    Status  On-going      PT LONG TERM GOAL #5   Title  Patient will ambulate at least 150 feet with mod I using LRAD to improve mobility for household and community distances.     Baseline  unable to take steps (06/12/2018); able to shift R leg forward and back with max A and RW at edge of plinth (06/19/2018); 07/08/18: unable to ambulate at this time. 09/11/18: unable to perform at this time; 10/23/2018: unable to perform at this time; 10/12: unable to perform at this time    Time  12    Period  Weeks    Status  On-going  PT LONG TERM GOAL #6   Title  Patient will navigate 4 steps with min A and BUE support to improve his ability to access community and social participation.     Baseline  unable to complet stairs (06/12/2018); unable to complete stairs (06/19/2018); 07/08/18: unable to attempt at this time; 09/11/18: unsafe to attempt at this time; 10/23/2018: unable to complet stairs (06/12/2018); unable to complete stairs (06/19/2018); 07/08/18: unable to attempt at this time; 09/11/18: unsafe to attempt at this time; 10/23/18: unsafe to attempt at this time    Time  12     Period  Weeks    Status  On-going      PT LONG TERM GOAL #7   Title  Patient will increase functional reach test to >15 inches in sitting to exhibit improved sitting balance and positioning and reduce fall risk;    Baseline  07/30/18: 12 inches; 09/11/18: 10-12 inches; 10/23/18: 12-13 inch; 10/12: 14.5"    Time  4    Period  Weeks    Status  Partially Met    Target Date  12/04/18            Plan - 11/20/18 1402    Clinical Impression Statement  Patient displays good motivation throughout today's session.  He demonstrates improvement in his confidence with forward reaching today in sitting, however requires intermittent cueing with his STSs to shift his weight further forward.  He tolerated 4 stands today, with the longest being 1 minute. He does require cues for motivation with increased fatigue during interventions. Pt will benefit from continued skilled PT services in order to increase strength and functional mobility in order to decrease caregiver burden, improve his ability to perform ADLs, and to improve his QOL.    Personal Factors and Comorbidities  Age;Comorbidity 3+    Comorbidities  Relevant past medical history and comorbidities include long term steroid use for lupus, CKD, chronic osteomyelitis, cervical spine stenosis, BPH, APS, alcohol abuse, peripheral artery disease, depression, GERD, obstructive sleep apnea, HTN, IBS, IgA deficiency, lumbar radiculopathy, RA, Stroke, toxic maculopathy in both eyes, systemic lupus erythematosus related syndrome, cardiac catheterization, carotid endarterectomy, cervical laminectomy, coronary angioplasty, Fusion C5-C7, knee arthroplasty, lumbar surgery.    Examination-Activity Limitations  Bed Mobility;Bend;Sit;Toileting;Stand;Stairs;Lift;Transfers;Squat;Locomotion Level;Carry;Dressing;Hygiene/Grooming;Continence    Examination-Participation Restrictions  Yard Work;Interpersonal Relationship;Community Activity    Stability/Clinical Decision  Making  Evolving/Moderate complexity    Rehab Potential  Fair    PT Frequency  3x / week    PT Duration  12 weeks    PT Treatment/Interventions  ADLs/Self Care Home Management;Electrical Stimulation;Moist Heat;Cryotherapy;Gait training;Stair training;Functional mobility training;Therapeutic exercise;Balance training;Neuromuscular re-education;Cognitive remediation;Patient/family education;Orthotic Fit/Training;Wheelchair mobility training;Manual techniques;Passive range of motion;Energy conservation;Joint Manipulations    PT Next Visit Plan  Work on strength, sitting/standing balance, functional activities such as rolling, bed mobility, transfers; caregiver training    PT Home Exercise Plan  handout provided via Beth during previous sessions    Consulted and Agree with Plan of Care  Patient;Family member/caregiver    Family Member Consulted  daughter [name]       Patient will benefit from skilled therapeutic intervention in order to improve the following deficits and impairments:  Abnormal gait, Decreased activity tolerance, Decreased cognition, Decreased endurance, Decreased knowledge of use of DME, Decreased range of motion, Decreased skin integrity, Decreased strength, Impaired perceived functional ability, Impaired sensation, Impaired UE functional use, Improper body mechanics, Pain, Cardiopulmonary status limiting activity, Decreased balance, Decreased coordination, Decreased mobility, Difficulty walking, Impaired tone, Postural dysfunction  Visit  Diagnosis: Muscle weakness (generalized)  Difficulty in walking, not elsewhere classified  Other lack of coordination     Problem List Patient Active Problem List   Diagnosis Date Noted  . Sepsis ([place]) 10/18/2018    This entire session was performed under direct supervision and direction of a licensed therapist/therapist assistant . I have personally read, edited and approve of the note as written.    Lutricia Horsfall, SPT Phillips Grout PT, DPT, GCS  Huprich,Jason 11/21/2018, 9:25 AM  [place] MAIN [facility] SERVICES [address] [place], [place], [address] Phone: [phone]   Fax:  [phone]  Name: DEYON CHIZEK MRN: [id number] Date of Birth: 04/14/1945

## 2018-11-20 NOTE — Therapy (Addendum)
[place] MAIN [facility] SERVICES [address] [place], [place], [address] Phone: [phone]   Fax:  [phone]  Occupational Therapy Treatment  Patient Details  Name: Kenneth Summers MRN: [id number] Date of Birth: Jun 20, 1945 No data recorded  Encounter Date: 11/20/2018  OT End of Session - 11/20/18 1507    Visit Number  60    Number of Visits  94    Date for OT Re-Evaluation  01/15/19    Authorization Type  Progress report period starting 10/30/2018    OT Start Time  1350    OT Stop Time  1430    OT Time Calculation (min)  40 min    Activity Tolerance  Patient tolerated treatment well    Behavior During Therapy  [facility] for tasks assessed/performed       Past Medical History:  Diagnosis Date  . Alcohol abuse   . APS (antiphospholipid syndrome) ([place])   . BPH (benign prostatic hyperplasia)   . CAD (coronary artery disease)   . Cellulitis   . Cervical spinal stenosis   . Chronic osteomyelitis ([place])   . CKD (chronic kidney disease)   . CKD (chronic kidney disease)   . Clostridium difficile diarrhea   . Depression   . Diplopia   . Fatigue   . GERD (gastroesophageal reflux disease)   . Hyperlipemia   . Hypertension   . Hypovitaminosis D   . IBS (irritable bowel syndrome)   . 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 Insomnia   . Left lumbosacral radiculopathy   . Moderate obstructive sleep apnea   . Osteomyelitis of foot ([place])   . PAD (peripheral artery disease) ([place])   . Pruritus   . RA (rheumatoid arthritis) ([place])   . Radiculopathy   . Restless legs syndrome   . RLS (restless legs syndrome)   . SLE (systemic lupus erythematosus related syndrome) ([place])   . Stroke ([place])   . Toxic maculopathy of both eyes     Past Surgical History:  Procedure Laterality Date  . CARDIAC CATHETERIZATION    . CAROTID ENDARTERECTOMY    . CERVICAL LAMINECTOMY    . CORONARY ANGIOPLASTY    . FRACTURE SURGERY    . fusion C5-6-7    . HERNIA REPAIR    . KNEE  ARTHROSCOPY    . TONSILLECTOMY      There were no vitals filed for this visit.  Subjective Assessment - 11/20/18 1506    Subjective   Pt. reports feeling better, and sleeping well last night.    Patient is accompanied by:  Family member    Pertinent History  Pt. is a 73 y.o. 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 Pt. PMHx includes: Multiple Falls, Lupus, DJD, Renal Abscess, CVA. Pt. resides with his wife. Pt.'s wife and daughter assist with ADLs. Pt. has caregivers in  for 2 hours a day, 6 days a week. Pt. received Rehab services in acute care, at SNF for STR, and [place] services. Pt. is retired from [employer] for [employer] and [employer].    Currently in Pain?  No/denies      OT TREATMENT    Neuro muscular re-education:  Pt. worked on using his left hand to grasp resistive cubes using his thumb, and 2nd digit. Pt. was able to initiate more with his left hand, and required fewer cues to engage his left hand.   Selfcare:  Pt. worked on UE dressing tasks  using hemi-dressing techniques. Pt. required fewer cues for position when donning a zip down sweatshirt, as pt. Pt. required step-by step verbal, and tactile cues. Pt. Utilized the mirror more to guide the jacket. Pt. Was provided with a zipper pull.  Response to Treatment:  Pt. is making steady progress with LUE strength, motor control, [facility] skills, attention, left sided awareness, and UE dressing. Pt. continues to work on improving LUE functioning, and overall ADL, and IADL functioning to maximize independence, and decrease caregiver burden.                          OT Education - 11/20/18 1507    Education Details  LUE functioning, [place], UE dressing    Person(s) Educated  Patient    Methods  Explanation;Demonstration    Comprehension  Returned demonstration;Tactile cues required;Verbalized understanding;Need further instruction          OT Long Term Goals -  10/28/18 1532      OT LONG TERM GOAL #1   Title  Pt. will increase left shoulder flexion AROM by 10 degrees to access cabinet/shelf.    Baseline  Pt. has difficulty reaching for items with the LUE secondary to decreased LUE ROM    Time  12    Period  Weeks    Status  On-going    Target Date  01/15/19      OT LONG TERM GOAL #2   Title  Pt. will donn a shirt with Supervision.    Baseline  Min    Time  12    Period  Weeks    Status  On-going    Target Date  01/15/19      OT LONG TERM GOAL #3   Title  Pt. will require ModA to perform LE dressing    Baseline  MaxA left, ModA with the right    Time  12    Period  Weeks    Status  On-going    Target Date  01/15/19      OT LONG TERM GOAL #4   Title  Pt. will improve left grip strength by 10# to be able to open a jar/container.    Baseline  Decreased grip since hospital readmission, pt. has difficulty opening containers    Time  12    Period  Weeks    Status  On-going    Target Date  01/15/19      OT LONG TERM GOAL #5   Title  Pt. will improve left hand [facility] skills to be able to assit with buttoning/zipping.    Baseline  Pt. has difficulty [facility] skills buttoning/zipping    Time  12    Period  Weeks    Status  On-going    Target Date  01/15/19      OT LONG TERM GOAL #6   Title  Pt. will demonstrate visual conmpensatory strategies during 100% of the time during ADLs    Baseline  Pt. requires cues for left sides awareness.    Time  12    Period  Weeks    Status  On-going    Target Date  01/15/19      OT LONG TERM GOAL #7   Title  Pt. will prepare a simple cold snack from w/c with supervision using cognitive compensatory strategies 100% of the time.    Baseline  Pt. is unable    Time  12    Period  Weeks  Target Date  11/27/18      OT LONG TERM GOAL #8   Title  Pt. will accurately identify potential safety hazard using good safety awareness, and judgement 100% for ADLs, and IADLs.    Baseline  Limited    Time  12     Period  Weeks    Status  On-going    Target Date  11/27/18      OT LONG TERM GOAL  #9   TITLE  Pt. will  navigate his w/c around obstacles with Supervision and 100% accuracy    Baseline  Pt. requires cuing for obstacle on the left    Time  12    Period  Weeks    Status  On-going    Target Date  11/27/18            Plan - 11/20/18 1508    Clinical Impression Statement  Pt. is making steady progress with LUE strength, motor control, [facility] skills, attention, left sided awareness, and UE dressing. Pt. continues to work on improving LUE functioning, and overall ADL, and IADL functioning to maximize independence, and decrease caregiver burden.    OT Occupational Profile and History  Problem Focused Assessment - Including review of records relating to presenting problem    Occupational performance deficits (Please refer to evaluation for details):  ADL's    Body Structure / Function / Physical Skills  UE functional use;Coordination;FMC;Dexterity;Strength;ROM    Cognitive Skills  Attention;Memory;Emotional;Problem Solve;Safety Awareness    Comorbidities Affecting Occupational Performance:  Presence of comorbidities impacting occupational performance    Modification or Assistance to Complete Evaluation   No modification of tasks or assist necessary to complete eval    OT Frequency  3x / week    OT Duration  12 weeks    OT Treatment/Interventions  Self-care/ADL training;DME and/or AE instruction;Therapeutic exercise;Therapeutic activities;Moist Heat;Cognitive remediation/compensation;Neuromuscular education;Visual/perceptual remediation/compensation;Coping strategies training;Patient/family education;Passive range of motion;Psychosocial skills training;Energy conservation;Functional Mobility Training    Recommended Other Services  PT    Consulted and Agree with Plan of Care  Patient    Family Member Consulted  Daughter [name]       Patient will benefit from skilled therapeutic intervention in  order to improve the following deficits and impairments:   Body Structure / Function / Physical Skills: UE functional use, Coordination, FMC, Dexterity, Strength, ROM Cognitive Skills: Attention, Memory, Emotional, Problem Solve, Safety Awareness     Visit Diagnosis: Muscle weakness (generalized)  Other lack of coordination    Problem List Patient Active Problem List   Diagnosis Date Noted  . Sepsis ([place]) 10/18/2018    Harrel Carina, MS, OTR/L 11/20/2018, 3:32 PM  [place] MAIN [facility] SERVICES [address] [place], [place], [address] Phone: [phone]   Fax:  [phone]  Name: Kenneth Summers MRN: [id number] Date of Birth: [birth year]

## 2018-11-21 ENCOUNTER — Ambulatory Visit: Payer: Medicare Other | Admitting: Occupational Therapy

## 2018-11-21 ENCOUNTER — Ambulatory Visit: Payer: Medicare Other

## 2018-11-25 ENCOUNTER — Ambulatory Visit: Payer: Medicare Other | Admitting: Occupational Therapy

## 2018-11-25 ENCOUNTER — Encounter: Payer: Self-pay | Admitting: Physical Therapy

## 2018-11-25 ENCOUNTER — Ambulatory Visit: Payer: Medicare Other | Admitting: Physical Therapy

## 2018-11-25 ENCOUNTER — Other Ambulatory Visit: Payer: Self-pay

## 2018-11-25 ENCOUNTER — Encounter: Payer: Self-pay | Admitting: Occupational Therapy

## 2018-11-25 DIAGNOSIS — M6281 Muscle weakness (generalized): Secondary | ICD-10-CM

## 2018-11-25 DIAGNOSIS — R278 Other lack of coordination: Secondary | ICD-10-CM

## 2018-11-25 DIAGNOSIS — R296 Repeated falls: Secondary | ICD-10-CM

## 2018-11-25 DIAGNOSIS — R262 Difficulty in walking, not elsewhere classified: Secondary | ICD-10-CM

## 2018-11-25 NOTE — Therapy (Signed)
[place] MAIN [facility] SERVICES [address] [place], [place], [address] Phone: [phone]   Fax:  [phone]  Physical Therapy Treatment  Patient Details  Name: Kenneth Summers MRN: [id number] Date of Birth: 1945-09-20 Referring Provider (PT): Dr. [name], Guy Begin   Encounter Date: 11/25/2018  PT End of Session - 11/25/18 1528    Visit Number  63    Number of Visits  69    Date for PT Re-Evaluation  12/04/18    Authorization Type  Medicare reporting period starting 09/11/18    Authorization Time Period  Current cert period 05/13/5679-27/51/70    PT Start Time  1302    PT Stop Time  1345    PT Time Calculation (min)  43 min    Equipment Utilized During Treatment  Gait belt    Activity Tolerance  Patient tolerated treatment well    Behavior During Therapy  [facility] for tasks assessed/performed       Past Medical History:  Diagnosis Date  . Alcohol abuse   . APS (antiphospholipid syndrome) ([place])   . BPH (benign prostatic hyperplasia)   . CAD (coronary artery disease)   . Cellulitis   . Cervical spinal stenosis   . Chronic osteomyelitis ([place])   . CKD (chronic kidney disease)   . CKD (chronic kidney disease)   . Clostridium difficile diarrhea   . Depression   . Diplopia   . Fatigue   . GERD (gastroesophageal reflux disease)   . Hyperlipemia   . Hypertension   . Hypovitaminosis D   . IBS (irritable bowel syndrome)   . IgA deficiency ([place])   . Insomnia   . Left lumbosacral radiculopathy   . Moderate obstructive sleep apnea   . Osteomyelitis of foot ([place])   . PAD (peripheral artery disease) ([place])   . Pruritus   . RA (rheumatoid arthritis) ([place])   . Radiculopathy   . Restless legs syndrome   . RLS (restless legs syndrome)   . SLE (systemic lupus erythematosus related syndrome) ([place])   . Stroke ([place])   . Toxic maculopathy of both eyes     Past Surgical History:  Procedure Laterality Date  . CARDIAC CATHETERIZATION    .  CAROTID ENDARTERECTOMY    . CERVICAL LAMINECTOMY    . CORONARY ANGIOPLASTY    . FRACTURE SURGERY    . fusion C5-6-7    . HERNIA REPAIR    . KNEE ARTHROSCOPY    . TONSILLECTOMY      There were no vitals filed for this visit.  Subjective Assessment - 11/25/18 1308    Subjective  Patient reports doing well at home; He is still sitting edge of bed and reaching for items to challenge sitting balance with good tolerance; He is still having trouble with transfers;    Patient is accompained by:  Family member    Pertinent History  Patient is a 73 y.o. male who presents to outpatient physical therapy with a referral for medical diagnosis of CVA. This patient's chief complaints consist of left hemiplegia and overall deconditioning leading to the following functional deficits: dependent for ADLs, IADLs, unable to transfer to car, dependent transfers at home with hoyer lift, unable to walk or stand without significant assistance, difficulty with W/C navigation..    Limitations  Sitting;Lifting;Standing;House hold activities;Writing;Walking;Other (comment)    Diagnostic tests  Brain MRI 11/01/2017: IMPRESSION: 1. Acute right ACA territory infarct as demonstrated on prior CT imaging. No intracranial hemorrhage or significant  mass effect. 2. 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    Currently in Pain?  No/denies    Pain Onset  More than a month ago    Multiple Pain Sites  No          TREATMENT:  -Forward weight shift x5 with hip clearance from chair, x 3 sets; able to accomplish ~2 inches of hip clearance from seat. Able to exhibit neutral positioning without lateral trunk lean;   -STS transfers x4 reps using parallel bar for BUE support, PT on left side to give TC for knee and hip extension; patient claimed he was too fatigued to stand as long as last session, which was exasperated by left knee valgus moment and decreased hip extension in weight bearing;  improved with use of bolster to block left knee valgus and mod VC to bring his belt to the parallel bar;  Able to transfer min A +1, progressing to CGA to close supervision upon standing; Able to hold standing position approximately 20-30 sec each transfer with good hip/knee extension especially on right side and good weight shift; Better trunk control noted this session with less left lateral trunk lean;   -Simulated leg press in WC x5 using BUE support on parallel bars to lift hips from chair, maintaining forward weight shift as initial position for STS; cues to straighten knees for improved hip clearance, improved with bolster placed between knees to prevent left knee valgus moment;   Pt educated throughout session about proper posture and technique with exercises. Improved exercise technique, movement at target joints, use of target muscles after min to mod verbal, visual, tactile cues.  Response to Treatment:  Patient demonstrates good motivation throughout today's session. He does fatigue quickly requiring intermittent seated rest break; He also became short of breath with each transfer as patient is doing more work. Noted that patient was able to progress transfer to min A +1 with parallel bars which is an improvement, especially considering he was unable to transfer over the weekend; Patient reports adherence with HEP and continues to be motivated to improve transfer ability and mobility;                     PT Education - 11/25/18 1527    Education Details  transfers, positioning, HEP    Person(s) Educated  Patient;Child(ren)    Methods  Explanation;Verbal cues;Demonstration    Comprehension  Verbalized understanding;Returned demonstration;Verbal cues required;Need further instruction       PT Short Term Goals - 11/18/18 1307      PT SHORT TERM GOAL #1   Title  Be independent with initial home exercise program for self-management of symptoms.    Baseline  HEP  to be given at second session; advice to practice unsupported sitting at home (06/19/2018); 07/08/18: Performing at home, 6/29: needs assistance but is doing exercise program regularly; 08/22/18: adherent;    Time  2    Period  Weeks    Status  Achieved    Target Date  06/26/18        PT Long Term Goals - 11/18/18 1309      PT LONG TERM GOAL #1   Title  Patient will complete all bed mobility with min A to improve functional independence for getting in and out of bed and adjusting in bed.     Baseline  max A - total A reported by family (06/19/2018); 07/08/18: still requires maxA, dtr reports improvement  in rolling since starting therapy; 6/23: min A to supervision in clinic; not doing at home right; 8/5: Pt's daughter states that his rolling has improved, but still has difficulty with all other bed mobility; 10/23/18: pts daughter states that she is doing about 35% of the work if he is in proper position, he does uses the bed rails to help roll;  using Hoyer for coming to sit EOB; 10/12: pts daughter states that she is doing about 30% of the work if he is in proper position, he does uses the bed rails to help roll, but it is improving;  using Hoyer for coming to sit EOB    Time  12    Period  Weeks    Status  Partially Met    Target Date  12/04/18      PT LONG TERM GOAL #2   Title  Patient will complete sit <> stand transfer chair to chair with Min A and LRAD to improve functional independence for household and community mobility.     Baseline  max A +2 STS at W/C (06/12/2018); max A +2-3 STS or stand pivot transfer, able to complete more reps (06/19/2018); 07/08/18: not safe to attempt at this time; 09/11/18: requires maxA+1 or modA+2 for STSs and squat pivot transfer; 10/23/18: requires maxA+1 or modA+2 for STSs and squat pivot transfer; 10/12: requires maxA+1 or modA+2 for STSs and squat pivot transfer    Time  12    Period  Weeks    Status  Partially Met    Target Date  12/04/18      PT LONG TERM GOAL  #3   Title  Patient will navigate power w/c with min A x 100 feet to improve mobility for household and short community distances.    Baseline  required total A (06/12/2018); able to roll 20 feet with min A (06/20/2018); 07/08/18: Pt can go approximately 10' without assist per family;  10/23/2018: pt's daughter states that his power WC mobility has become 100% better with the new hand control, slowed speed, and with practice; 10/12: pt's daughter reports that he can perform household WC mobility and limited community ambulation with minA    Time  12    Period  Weeks    Status  Achieved      PT LONG TERM GOAL #4   Title  Patient will complete car transfers W/C to family SUV with min A using LRAD to improve his abilty to participate in community activities.     Baseline  unable to complete car transfers at all (06/12/2018); unable to complete car transfers at all (06/19/2018).; 07/08/18: Unable to perform at this time; 09/11/18: unable to perform at this time; 10/23/2018: unable to complete car transfers at all; 10/12:  unable to complete car transfers at all    Time  12    Period  Weeks    Status  On-going      PT LONG TERM GOAL #5   Title  Patient will ambulate at least 150 feet with mod I using LRAD to improve mobility for household and community distances.     Baseline  unable to take steps (06/12/2018); able to shift R leg forward and back with max A and RW at edge of plinth (06/19/2018); 07/08/18: unable to ambulate at this time. 09/11/18: unable to perform at this time; 10/23/2018: unable to perform at this time; 10/12: unable to perform at this time    Time  12    Period  Weeks    Status  On-going      PT LONG TERM GOAL #6   Title  Patient will navigate 4 steps with min A and BUE support to improve his ability to access community and social participation.     Baseline  unable to complet stairs (06/12/2018); unable to complete stairs (06/19/2018); 07/08/18: unable to attempt at this time; 09/11/18: unsafe to attempt  at this time; 10/23/2018: unable to complet stairs (06/12/2018); unable to complete stairs (06/19/2018); 07/08/18: unable to attempt at this time; 09/11/18: unsafe to attempt at this time; 10/23/18: unsafe to attempt at this time    Time  12    Period  Weeks    Status  On-going      PT LONG TERM GOAL #7   Title  Patient will increase functional reach test to >15 inches in sitting to exhibit improved sitting balance and positioning and reduce fall risk;    Baseline  07/30/18: 12 inches; 09/11/18: 10-12 inches; 10/23/18: 12-13 inch; 10/12: 14.5"    Time  4    Period  Weeks    Status  Partially Met    Target Date  12/04/18            Plan - 11/25/18 1528    Clinical Impression Statement  Patient motivated and participated well within session. He reports feeling increased "weakness" this session; however PT explained that he may be feeling "weaker" because its harder for him to complete transfer because he is doing more of the work requiring less assistance. Patient able to transfer sit<>Stand at parallel bars with min A +1 with bolster between knees to help support LLE knee positioning and avoid valgus position. Patient does require frequent cues for proper positioning and weight shift for optimal transfer ability. He does fatigue quickly but this could be related to him doing more of the work. He would benefit from additional skilled PT Intervention to improve strength, balance and mobility; Reinforced HEP;    Personal Factors and Comorbidities  Age;Comorbidity 3+    Comorbidities  Relevant past medical history and comorbidities include long term steroid use for lupus, CKD, chronic osteomyelitis, cervical spine stenosis, BPH, APS, alcohol abuse, peripheral artery disease, depression, GERD, obstructive sleep apnea, HTN, IBS, IgA deficiency, lumbar radiculopathy, RA, Stroke, toxic maculopathy in both eyes, systemic lupus erythematosus related syndrome, cardiac catheterization, carotid endarterectomy, cervical  laminectomy, coronary angioplasty, Fusion C5-C7, knee arthroplasty, lumbar surgery.    Examination-Activity Limitations  Bed Mobility;Bend;Sit;Toileting;Stand;Stairs;Lift;Transfers;Squat;Locomotion Level;Carry;Dressing;Hygiene/Grooming;Continence    Examination-Participation Restrictions  Yard Work;Interpersonal Relationship;Community Activity    Stability/Clinical Decision Making  Evolving/Moderate complexity    Rehab Potential  Fair    PT Frequency  3x / week    PT Duration  12 weeks    PT Treatment/Interventions  ADLs/Self Care Home Management;Electrical Stimulation;Moist Heat;Cryotherapy;Gait training;Stair training;Functional mobility training;Therapeutic exercise;Balance training;Neuromuscular re-education;Cognitive remediation;Patient/family education;Orthotic Fit/Training;Wheelchair mobility training;Manual techniques;Passive range of motion;Energy conservation;Joint Manipulations    PT Next Visit Plan  Work on strength, sitting/standing balance, functional activities such as rolling, bed mobility, transfers; caregiver training    PT Home Exercise Plan  handout provided via Beth during previous sessions    Consulted and Agree with Plan of Care  Patient;Family member/caregiver    Family Member Consulted  daughter [name]       Patient will benefit from skilled therapeutic intervention in order to improve the following deficits and impairments:  Abnormal gait, Decreased activity tolerance, Decreased cognition, Decreased endurance, Decreased knowledge of use of DME, Decreased range of  motion, Decreased skin integrity, Decreased strength, Impaired perceived functional ability, Impaired sensation, Impaired UE functional use, Improper body mechanics, Pain, Cardiopulmonary status limiting activity, Decreased balance, Decreased coordination, Decreased mobility, Difficulty walking, Impaired tone, Postural dysfunction  Visit Diagnosis: Muscle weakness (generalized)  Other lack of  coordination  Difficulty in walking, not elsewhere classified  Repeated falls     Problem List Patient Active Problem List   Diagnosis Date Noted  . Sepsis ([place]) 10/18/2018    Trotter,Margaret PT, DPT 11/25/2018, 3:30 PM  [place] MAIN [facility] SERVICES [address] [place], [place], [address] Phone: [phone]   Fax:  [phone]  Name: JACCOB CZAPLICKI MRN: [id number] Date of Birth: 11-12-1945

## 2018-11-25 NOTE — Therapy (Addendum)
[place] MAIN [facility] SERVICES [address] [place], [place], [address] Phone: [phone]   Fax:  [phone]  Occupational Therapy Treatment  Patient Details  Name: Kenneth Summers MRN: [id number] Date of Birth: 05/08/1945 No data recorded  Encounter Date: 11/25/2018  OT End of Session - 11/25/18 1539    Visit Number  60    Number of Visits  59   Date for OT Re-Evaluation  01/15/19    Authorization Type  Progress report period starting 10/30/2018    OT Start Time  1350    OT Stop Time  1422    OT Time Calculation (min)  32 min    Activity Tolerance  Patient tolerated treatment well    Behavior During Therapy  [facility] for tasks assessed/performed       Past Medical History:  Diagnosis Date  . Alcohol abuse   . APS (antiphospholipid syndrome) ([place])   . BPH (benign prostatic hyperplasia)   . CAD (coronary artery disease)   . Cellulitis   . Cervical spinal stenosis   . Chronic osteomyelitis ([place])   . CKD (chronic kidney disease)   . CKD (chronic kidney disease)   . Clostridium difficile diarrhea   . Depression   . Diplopia   . Fatigue   . GERD (gastroesophageal reflux disease)   . Hyperlipemia   . Hypertension   . Hypovitaminosis D   . IBS (irritable bowel syndrome)   . 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 Insomnia   . Left lumbosacral radiculopathy   . Moderate obstructive sleep apnea   . Osteomyelitis of foot ([place])   . PAD (peripheral artery disease) ([place])   . Pruritus   . RA (rheumatoid arthritis) ([place])   . Radiculopathy   . Restless legs syndrome   . RLS (restless legs syndrome)   . SLE (systemic lupus erythematosus related syndrome) ([place])   . Stroke ([place])   . Toxic maculopathy of both eyes     Past Surgical History:  Procedure Laterality Date  . CARDIAC CATHETERIZATION    . CAROTID ENDARTERECTOMY    . CERVICAL LAMINECTOMY    . CORONARY ANGIOPLASTY    . FRACTURE SURGERY    . fusion C5-6-7    . HERNIA REPAIR    . KNEE  ARTHROSCOPY    . TONSILLECTOMY      There were no vitals filed for this visit.  Subjective Assessment - 11/25/18 1538    Subjective   Pt. had an episode of dizziness    Patient is accompanied by:  Family member    Pertinent History  Pt. is a 73 y.o. 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 Pt. PMHx includes: Multiple Falls, Lupus, DJD, Renal Abscess, CVA. Pt. resides with his wife. Pt.'s wife and daughter assist with ADLs. Pt. has caregivers in  for 2 hours a day, 6 days a week. Pt. received Rehab services in acute care, at SNF for STR, and [place] services. Pt. is retired from [employer] for [employer] and [employer].    Currently in Pain?  No/denies      OT TREATMENT    Neuro muscular re-education:  Pt. worked on bilateral UEs grasping and constructing a PCP pipe tower following a design pattern. Pt. required cues to engage his LUE, and had difficulty determining the appropriate joint connector pieces.  Selfcare:  Pt. worked on UE dressing tasks using hemi-dressing techniques. Pt. required fewer cues for position  when donning a zip down sweatshirt, as pt. Pt. Required fewer step-by step verbal, and tactile cues. Pt. Utilized the mirror more to guide the jacket.  Response to Treatment   Pt. had an episode of dizzines, and SOB during the session today. Pt.'s BP 81/47 with the HR 83. Pt. reported reported required posiitoning changes form upright to reclining as pt. had difficulty tolerating upright midline positioning in the chair. Pt. continues to work on improving LUE functioning during ADLs, and IADL tasks                        OT Education - 11/25/18 1539    Education Details  LUE functioning, [place], UE dressing    Person(s) Educated  Patient    Methods  Explanation;Demonstration    Comprehension  Returned demonstration;Tactile cues required;Verbalized understanding;Need further instruction          OT Long Term  Goals - 10/28/18 1532      OT LONG TERM GOAL #1   Title  Pt. will increase left shoulder flexion AROM by 10 degrees to access cabinet/shelf.    Baseline  Pt. has difficulty reaching for items with the LUE secondary to decreased LUE ROM    Time  12    Period  Weeks    Status  On-going    Target Date  01/15/19      OT LONG TERM GOAL #2   Title  Pt. will donn a shirt with Supervision.    Baseline  Min    Time  12    Period  Weeks    Status  On-going    Target Date  01/15/19      OT LONG TERM GOAL #3   Title  Pt. will require ModA to perform LE dressing    Baseline  MaxA left, ModA with the right    Time  12    Period  Weeks    Status  On-going    Target Date  01/15/19      OT LONG TERM GOAL #4   Title  Pt. will improve left grip strength by 10# to be able to open a jar/container.    Baseline  Decreased grip since hospital readmission, pt. has difficulty opening containers    Time  12    Period  Weeks    Status  On-going    Target Date  01/15/19      OT LONG TERM GOAL #5   Title  Pt. will improve left hand [facility] skills to be able to assit with buttoning/zipping.    Baseline  Pt. has difficulty [facility] skills buttoning/zipping    Time  12    Period  Weeks    Status  On-going    Target Date  01/15/19      OT LONG TERM GOAL #6   Title  Pt. will demonstrate visual conmpensatory strategies during 100% of the time during ADLs    Baseline  Pt. requires cues for left sides awareness.    Time  12    Period  Weeks    Status  On-going    Target Date  01/15/19      OT LONG TERM GOAL #7   Title  Pt. will prepare a simple cold snack from w/c with supervision using cognitive compensatory strategies 100% of the time.    Baseline  Pt. is unable    Time  12    Period  Weeks  Target Date  11/27/18      OT LONG TERM GOAL #8   Title  Pt. will accurately identify potential safety hazard using good safety awareness, and judgement 100% for ADLs, and IADLs.    Baseline  Limited    Time  12     Period  Weeks    Status  On-going    Target Date  11/27/18      OT LONG TERM GOAL  #9   TITLE  Pt. will  navigate his w/c around obstacles with Supervision and 100% accuracy    Baseline  Pt. requires cuing for obstacle on the left    Time  12    Period  Weeks    Status  On-going    Target Date  11/27/18            Plan - 11/25/18 1544    Clinical Impression Statement  Pt. had an episode of dizzines, and SOB during the session today. Pt.'s BP 81/47 with the HR 83. Pt. reported reported required posiitoning changes form upright to reclining as pt. had difficulty tolerating upright midline positioning in the chair. Pt. continues to work on improving LUE functioning during ADLs, and IADL tasks    Occupational performance deficits (Please refer to evaluation for details):  ADL's    Body Structure / Function / Physical Skills  UE functional use;Coordination;FMC;Dexterity;Strength;ROM    Cognitive Skills  Attention;Memory;Emotional;Problem Solve;Safety Awareness    Rehab Potential  Good    Clinical Decision Making  Several treatment options, min-mod task modification necessary    Comorbidities Affecting Occupational Performance:  Presence of comorbidities impacting occupational performance    Comorbidities impacting occupational performance description:  Phyical, cognitive, visual,  medical comorbidities    Modification or Assistance to Complete Evaluation   No modification of tasks or assist necessary to complete eval    OT Frequency  3x / week    OT Duration  12 weeks    OT Treatment/Interventions  Self-care/ADL training;DME and/or AE instruction;Therapeutic exercise;Therapeutic activities;Moist Heat;Cognitive remediation/compensation;Neuromuscular education;Visual/perceptual remediation/compensation;Coping strategies training;Patient/family education;Passive range of motion;Psychosocial skills training;Energy conservation;Functional Mobility Training    Consulted and Agree with Plan  of Care  Patient       Patient will benefit from skilled therapeutic intervention in order to improve the following deficits and impairments:   Body Structure / Function / Physical Skills: UE functional use, Coordination, FMC, Dexterity, Strength, ROM Cognitive Skills: Attention, Memory, Emotional, Problem Solve, Safety Awareness     Visit Diagnosis: Muscle weakness (generalized)  Other lack of coordination    Problem List Patient Active Problem List   Diagnosis Date Noted  . Sepsis ([place]) 10/18/2018    Harrel Carina, MS, OTR/L 11/25/2018, 3:50 PM  [place] MAIN [facility] SERVICES [address] [place], [place], [address] Phone: [phone]   Fax:  [phone]  Name: Kenneth Summers MRN: [id number] Date of Birth: 1945/05/13

## 2018-11-27 ENCOUNTER — Ambulatory Visit: Payer: Medicare Other

## 2018-11-27 ENCOUNTER — Ambulatory Visit: Payer: Medicare Other | Admitting: Occupational Therapy

## 2018-11-28 ENCOUNTER — Ambulatory Visit: Payer: Medicare Other

## 2018-11-28 ENCOUNTER — Other Ambulatory Visit: Payer: Self-pay

## 2018-11-28 ENCOUNTER — Ambulatory Visit: Payer: Medicare Other | Admitting: Occupational Therapy

## 2018-11-28 ENCOUNTER — Encounter: Payer: Self-pay | Admitting: Occupational Therapy

## 2018-11-28 DIAGNOSIS — M6281 Muscle weakness (generalized): Secondary | ICD-10-CM

## 2018-11-28 DIAGNOSIS — R262 Difficulty in walking, not elsewhere classified: Secondary | ICD-10-CM

## 2018-11-28 DIAGNOSIS — R278 Other lack of coordination: Secondary | ICD-10-CM

## 2018-11-28 NOTE — Therapy (Signed)
[place] MAIN [facility] SERVICES [address] [place], [place], [address] Phone: [phone]   Fax:  [phone]  Physical Therapy Treatment  Patient Details  Name: Kenneth Summers MRN: [id number] Date of Birth: 09/25/1945 Referring Provider (PT): Dr. [name], Guy Begin   Encounter Date: 11/28/2018  PT End of Session - 11/28/18 1254    Visit Number  61    Number of Visits  5    Date for PT Re-Evaluation  12/04/18    Authorization Type  Medicare reporting period starting 09/11/18    Authorization Time Period  Current cert period 03/14/6387-37/34/28    PT Start Time  1300    PT Stop Time  1345    PT Time Calculation (min)  45 min    Equipment Utilized During Treatment  Gait belt    Activity Tolerance  Patient tolerated treatment well    Behavior During Therapy  [facility] for tasks assessed/performed       Past Medical History:  Diagnosis Date  . Alcohol abuse   . APS (antiphospholipid syndrome) ([place])   . BPH (benign prostatic hyperplasia)   . CAD (coronary artery disease)   . Cellulitis   . Cervical spinal stenosis   . Chronic osteomyelitis ([place])   . CKD (chronic kidney disease)   . CKD (chronic kidney disease)   . Clostridium difficile diarrhea   . Depression   . Diplopia   . Fatigue   . GERD (gastroesophageal reflux disease)   . Hyperlipemia   . Hypertension   . Hypovitaminosis D   . IBS (irritable bowel syndrome)   . IgA deficiency ([place])   . Insomnia   . Left lumbosacral radiculopathy   . Moderate obstructive sleep apnea   . Osteomyelitis of foot ([place])   . PAD (peripheral artery disease) ([place])   . Pruritus   . RA (rheumatoid arthritis) ([place])   . Radiculopathy   . Restless legs syndrome   . RLS (restless legs syndrome)   . SLE (systemic lupus erythematosus related syndrome) ([place])   . Stroke ([place])   . Toxic maculopathy of both eyes     Past Surgical History:  Procedure Laterality Date  . CARDIAC CATHETERIZATION    .  CAROTID ENDARTERECTOMY    . CERVICAL LAMINECTOMY    . CORONARY ANGIOPLASTY    . FRACTURE SURGERY    . fusion C5-6-7    . HERNIA REPAIR    . KNEE ARTHROSCOPY    . TONSILLECTOMY      There were no vitals filed for this visit.  Subjective Assessment - 11/29/18 0933    Subjective  Patient reports doing well at home. He is still having trouble with transfers but is practicing in a limit capacity as able at home. No pain reported today and no specific questions or concerns.    Patient is accompained by:  Family member    Pertinent History  Patient is a 73 y.o. male who presents to outpatient physical therapy with a referral for medical diagnosis of CVA. This patient's chief complaints consist of left hemiplegia and overall deconditioning leading to the following functional deficits: dependent for ADLs, IADLs, unable to transfer to car, dependent transfers at home with hoyer lift, unable to walk or stand without significant assistance, difficulty with W/C navigation..    Limitations  Sitting;Lifting;Standing;House hold activities;Writing;Walking;Other (comment)    Diagnostic tests  Brain MRI 11/01/2017: IMPRESSION: 1. Acute right ACA territory infarct as demonstrated on prior CT imaging. No intracranial  hemorrhage or significant mass effect. 2. 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    Currently in Pain?  No/denies    Pain Onset  More than a month ago         TREATMENT   STS transfers x5using parallel bar for BUE support, SPT on pt's left side and PT in front of pt providing mod/maxA+2 to come to stand from [facility]. Mod VC were provided throughout for technique to pull his hips forward and stand up tall. CGA+2 with intermittent min/modA+2 was provided once in standing. He stood for 20s forthefirst set, 2:20 during the 2nd set,  1:40 during the 3rd set, 2:10 during 4th set, and 2:15 during the 5th set.     Pt educated throughout session about proper  posture and technique with exercises. Improved exercise technique, movement at target joints, use of target muscles after min to mod verbal, visual, tactile cues.   Patient displays good motivation throughout today's session.  He continues to demonstrate improvements in his forward weight shift when coming to stand, but still requires up to mod/maxA+2 to come to stand.  He was able to complete 5 stands today, with increased time tolerated in standing.He continues to benefit from verbal cues for motivation and refocusing with increased fatigueduring interventions. Pt will benefit from continued skilled PT services in order to increase strength and functional mobilityin order to decrease caregiver burden, improve his ability to perform ADLs, and to improve his QOL.          PT Short Term Goals - 11/18/18 1307      PT SHORT TERM GOAL #1   Title  Be independent with initial home exercise program for self-management of symptoms.    Baseline  HEP to be given at second session; advice to practice unsupported sitting at home (06/19/2018); 07/08/18: Performing at home, 6/29: needs assistance but is doing exercise program regularly; 08/22/18: adherent;    Time  2    Period  Weeks    Status  Achieved    Target Date  06/26/18        PT Long Term Goals - 11/18/18 1309      PT LONG TERM GOAL #1   Title  Patient will complete all bed mobility with min A to improve functional independence for getting in and out of bed and adjusting in bed.     Baseline  max A - total A reported by family (06/19/2018); 07/08/18: still requires maxA, dtr reports improvement in rolling since starting therapy; 6/23: min A to supervision in clinic; not doing at home right; 8/5: Pt's daughter states that his rolling has improved, but still has difficulty with all other bed mobility; 10/23/18: pts daughter states that she is doing about 35% of the work if he is in proper position, he does uses the bed rails to help roll;   using Hoyer for coming to sit EOB; 10/12: pts daughter states that she is doing about 30% of the work if he is in proper position, he does uses the bed rails to help roll, but it is improving;  using Hoyer for coming to sit EOB    Time  12    Period  Weeks    Status  Partially Met    Target Date  12/04/18      PT LONG TERM GOAL #2   Title  Patient will complete sit <> stand transfer chair to chair with Min A and LRAD to improve  functional independence for household and community mobility.     Baseline  max A +2 STS at W/C (06/12/2018); max A +2-3 STS or stand pivot transfer, able to complete more reps (06/19/2018); 07/08/18: not safe to attempt at this time; 09/11/18: requires maxA+1 or modA+2 for STSs and squat pivot transfer; 10/23/18: requires maxA+1 or modA+2 for STSs and squat pivot transfer; 10/12: requires maxA+1 or modA+2 for STSs and squat pivot transfer    Time  12    Period  Weeks    Status  Partially Met    Target Date  12/04/18      PT LONG TERM GOAL #3   Title  Patient will navigate power w/c with min A x 100 feet to improve mobility for household and short community distances.    Baseline  required total A (06/12/2018); able to roll 20 feet with min A (06/20/2018); 07/08/18: Pt can go approximately 10' without assist per family;  10/23/2018: pt's daughter states that his power WC mobility has become 100% better with the new hand control, slowed speed, and with practice; 10/12: pt's daughter reports that he can perform household WC mobility and limited community ambulation with minA    Time  12    Period  Weeks    Status  Achieved      PT LONG TERM GOAL #4   Title  Patient will complete car transfers W/C to family SUV with min A using LRAD to improve his abilty to participate in community activities.     Baseline  unable to complete car transfers at all (06/12/2018); unable to complete car transfers at all (06/19/2018).; 07/08/18: Unable to perform at this time; 09/11/18: unable to perform at this  time; 10/23/2018: unable to complete car transfers at all; 10/12:  unable to complete car transfers at all    Time  12    Period  Weeks    Status  On-going      PT LONG TERM GOAL #5   Title  Patient will ambulate at least 150 feet with mod I using LRAD to improve mobility for household and community distances.     Baseline  unable to take steps (06/12/2018); able to shift R leg forward and back with max A and RW at edge of plinth (06/19/2018); 07/08/18: unable to ambulate at this time. 09/11/18: unable to perform at this time; 10/23/2018: unable to perform at this time; 10/12: unable to perform at this time    Time  12    Period  Weeks    Status  On-going      PT LONG TERM GOAL #6   Title  Patient will navigate 4 steps with min A and BUE support to improve his ability to access community and social participation.     Baseline  unable to complet stairs (06/12/2018); unable to complete stairs (06/19/2018); 07/08/18: unable to attempt at this time; 09/11/18: unsafe to attempt at this time; 10/23/2018: unable to complet stairs (06/12/2018); unable to complete stairs (06/19/2018); 07/08/18: unable to attempt at this time; 09/11/18: unsafe to attempt at this time; 10/23/18: unsafe to attempt at this time    Time  12    Period  Weeks    Status  On-going      PT LONG TERM GOAL #7   Title  Patient will increase functional reach test to >15 inches in sitting to exhibit improved sitting balance and positioning and reduce fall risk;    Baseline  07/30/18: 12 inches; 09/11/18:  10-12 inches; 10/23/18: 12-13 inch; 10/12: 14.5"    Time  4    Period  Weeks    Status  Partially Met    Target Date  12/04/18            Plan - 11/28/18 1708    Clinical Impression Statement  Patient displays good motivation throughout today's session.  He continues to demonstrate improvements in his forward weight shift when coming to stand, but still requires up to mod/maxA+2 to come to stand.  He was able to complete 5 stands today, with  increased time tolerated in standing. He continues to benefit from verbal cues for motivation and refocusing with increased fatigue during interventions. Pt will benefit from continued skilled PT services in order to increase strength and functional mobility in order to decrease caregiver burden, improve his ability to perform ADLs, and to improve his QOL.    Personal Factors and Comorbidities  Age;Comorbidity 3+    Comorbidities  Relevant past medical history and comorbidities include long term steroid use for lupus, CKD, chronic osteomyelitis, cervical spine stenosis, BPH, APS, alcohol abuse, peripheral artery disease, depression, GERD, obstructive sleep apnea, HTN, IBS, IgA deficiency, lumbar radiculopathy, RA, Stroke, toxic maculopathy in both eyes, systemic lupus erythematosus related syndrome, cardiac catheterization, carotid endarterectomy, cervical laminectomy, coronary angioplasty, Fusion C5-C7, knee arthroplasty, lumbar surgery.    Examination-Activity Limitations  Bed Mobility;Bend;Sit;Toileting;Stand;Stairs;Lift;Transfers;Squat;Locomotion Level;Carry;Dressing;Hygiene/Grooming;Continence    Examination-Participation Restrictions  Yard Work;Interpersonal Relationship;Community Activity    Stability/Clinical Decision Making  Evolving/Moderate complexity    Rehab Potential  Fair    PT Frequency  3x / week    PT Duration  12 weeks    PT Treatment/Interventions  ADLs/Self Care Home Management;Electrical Stimulation;Moist Heat;Cryotherapy;Gait training;Stair training;Functional mobility training;Therapeutic exercise;Balance training;Neuromuscular re-education;Cognitive remediation;Patient/family education;Orthotic Fit/Training;Wheelchair mobility training;Manual techniques;Passive range of motion;Energy conservation;Joint Manipulations    PT Next Visit Plan  Work on strength, sitting/standing balance, functional activities such as rolling, bed mobility, transfers; caregiver training    PT Home  Exercise Plan  handout provided via Beth during previous sessions    Consulted and Agree with Plan of Care  Patient;Family member/caregiver    Family Member Consulted  daughter [name]       Patient will benefit from skilled therapeutic intervention in order to improve the following deficits and impairments:  Abnormal gait, Decreased activity tolerance, Decreased cognition, Decreased endurance, Decreased knowledge of use of DME, Decreased range of motion, Decreased skin integrity, Decreased strength, Impaired perceived functional ability, Impaired sensation, Impaired UE functional use, Improper body mechanics, Pain, Cardiopulmonary status limiting activity, Decreased balance, Decreased coordination, Decreased mobility, Difficulty walking, Impaired tone, Postural dysfunction  Visit Diagnosis: Muscle weakness (generalized)  Difficulty in walking, not elsewhere classified     Problem List Patient Active Problem List   Diagnosis Date Noted  . Sepsis ([place]) 10/18/2018    This entire session was performed under direct supervision and direction of a licensed therapist/therapist assistant . I have personally read, edited and approve of the note as written.   Lutricia Horsfall, SPT Phillips Grout PT, DPT, GCS  Huprich,Jason 11/29/2018, 9:34 AM  [place] MAIN [facility] SERVICES [address] [place], [place], [address] Phone: [phone]   Fax:  [phone]  Name: Kenneth Summers MRN: [id number] Date of Birth: August 06, 1945

## 2018-11-28 NOTE — Therapy (Addendum)
[place] MAIN [facility] SERVICES [address] [place], [place], [address] Phone: [phone]   Fax:  [phone]  Occupational Therapy Treatment  Patient Details  Name: Kenneth Summers MRN: [id number] Date of Birth: [birth year] No data recorded  Encounter Date: 11/28/2018  OT End of Session - 11/28/18 1718    Visit Number  90    Number of Visits  60    Date for OT Re-Evaluation  01/15/19    Authorization Type  Progress report period starting 10/30/2018    OT Start Time  1350    OT Stop Time  1430    OT Time Calculation (min)  40 min    Activity Tolerance  Patient tolerated treatment well    Behavior During Therapy  [facility] for tasks assessed/performed       Past Medical History:  Diagnosis Date  . Alcohol abuse   . APS (antiphospholipid syndrome) ([place])   . BPH (benign prostatic hyperplasia)   . CAD (coronary artery disease)   . Cellulitis   . Cervical spinal stenosis   . Chronic osteomyelitis ([place])   . CKD (chronic kidney disease)   . CKD (chronic kidney disease)   . Clostridium difficile diarrhea   . Depression   . Diplopia   . Fatigue   . GERD (gastroesophageal reflux disease)   . Hyperlipemia   . Hypertension   . Hypovitaminosis D   . IBS (irritable bowel syndrome)   . 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 Insomnia   . Left lumbosacral radiculopathy   . Moderate obstructive sleep apnea   . Osteomyelitis of foot ([place])   . PAD (peripheral artery disease) ([place])   . Pruritus   . RA (rheumatoid arthritis) ([place])   . Radiculopathy   . Restless legs syndrome   . RLS (restless legs syndrome)   . SLE (systemic lupus erythematosus related syndrome) ([place])   . Stroke ([place])   . Toxic maculopathy of both eyes     Past Surgical History:  Procedure Laterality Date  . CARDIAC CATHETERIZATION    . CAROTID ENDARTERECTOMY    . CERVICAL LAMINECTOMY    . CORONARY ANGIOPLASTY    . FRACTURE SURGERY    . fusion C5-6-7    . HERNIA REPAIR    . KNEE  ARTHROSCOPY    . TONSILLECTOMY      There were no vitals filed for this visit.  Subjective Assessment - 11/28/18 1719    Subjective   Pt. reports that his wife came home from rehab yesterday.    Patient is accompanied by:  Family member    Pertinent History  Pt. is a 73 y.o. 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 Pt. PMHx includes: Multiple Falls, Lupus, DJD, Renal Abscess, CVA. Pt. resides with his wife. Pt.'s wife and daughter assist with ADLs. Pt. has caregivers in  for 2 hours a day, 6 days a week. Pt. received Rehab services in acute care, at SNF for STR, and [place] services. Pt. is retired from [employer] for [employer] and [employer].    Currently in Pain?  No/denies      OT TREATMENT    Neuro muscular re-education:  Pt. Worked on following design patterns to construct a PCP pipe tower. Pt. worked grasping the pipe pieces with his left hand with cues. Pt. worked on using his bilateral hands to construct a tower following design of moderate complexity. Pt. Worked on forward flexing  at the trunk when reaching forward to return the pipe pieces to the container.   Selfcare:  Pt. worked on UE dressing tasks using hemi-dressing techniques. Pt. required fewercues for position when donning a zip down sweatshirt, as pt. Pt. Required fewer step-by step verbal, and tactile cues. Pt. Required repositioning for the jacket around his back, at his trunk, as well as the for the position of the zipper.   Response to Treatment:  No episodes of dizziness today. Pt. required one rest break requiring his w/c to be fully reclined for his back. Pt. required fewer cues today for UE dressing. Pt. continues to require step-by step cues, however required less cuing. Pt. requires verbal encouragement, and positive reinforcement. Pt. continues to require verbal cues to engage his LUEduring ADLs, and IADLs. Pt. continues to work on improving, and promoting  independence.                          OT Education - 11/28/18 1717    Education Details  LUE functioning, [place], UE dressing    Person(s) Educated  Patient    Methods  Explanation;Demonstration    Comprehension  Returned demonstration;Tactile cues required;Verbalized understanding;Need further instruction          OT Long Term Goals - 10/28/18 1532      OT LONG TERM GOAL #1   Title  Pt. will increase left shoulder flexion AROM by 10 degrees to access cabinet/shelf.    Baseline  Pt. has difficulty reaching for items with the LUE secondary to decreased LUE ROM    Time  12    Period  Weeks    Status  On-going    Target Date  01/15/19      OT LONG TERM GOAL #2   Title  Pt. will donn a shirt with Supervision.    Baseline  Min    Time  12    Period  Weeks    Status  On-going    Target Date  01/15/19      OT LONG TERM GOAL #3   Title  Pt. will require ModA to perform LE dressing    Baseline  MaxA left, ModA with the right    Time  12    Period  Weeks    Status  On-going    Target Date  01/15/19      OT LONG TERM GOAL #4   Title  Pt. will improve left grip strength by 10# to be able to open a jar/container.    Baseline  Decreased grip since hospital readmission, pt. has difficulty opening containers    Time  12    Period  Weeks    Status  On-going    Target Date  01/15/19      OT LONG TERM GOAL #5   Title  Pt. will improve left hand [facility] skills to be able to assit with buttoning/zipping.    Baseline  Pt. has difficulty [facility] skills buttoning/zipping    Time  12    Period  Weeks    Status  On-going    Target Date  01/15/19      OT LONG TERM GOAL #6   Title  Pt. will demonstrate visual conmpensatory strategies during 100% of the time during ADLs    Baseline  Pt. requires cues for left sides awareness.    Time  12    Period  Weeks    Status  On-going  Target Date  01/15/19      OT LONG TERM GOAL #7   Title  Pt. will prepare a simple cold snack  from w/c with supervision using cognitive compensatory strategies 100% of the time.    Baseline  Pt. is unable    Time  12    Period  Weeks    Target Date  11/27/18      OT LONG TERM GOAL #8   Title  Pt. will accurately identify potential safety hazard using good safety awareness, and judgement 100% for ADLs, and IADLs.    Baseline  Limited    Time  12    Period  Weeks    Status  On-going    Target Date  11/27/18      OT LONG TERM GOAL  #9   TITLE  Pt. will  navigate his w/c around obstacles with Supervision and 100% accuracy    Baseline  Pt. requires cuing for obstacle on the left    Time  12    Period  Weeks    Status  On-going    Target Date  11/27/18            Plan - 11/28/18 1718    Clinical Impression Statement  No episodes of dizziness today. Pt. required one rest break requiring his w/c to be fully reclined for his back. Pt. required fewer cues today for UE dressing. Pt. continues to require step-by step cues, however required less cuing. Pt. requires verbal encouragement, and positive reinforcement. Pt. continues to require verbal cues to engage his LUEduring ADLs, and IADLs. Pt. continues to work on improving, and promoting independence.    OT Occupational Profile and History  Problem Focused Assessment - Including review of records relating to presenting problem    Occupational performance deficits (Please refer to evaluation for details):  ADL's    Body Structure / Function / Physical Skills  UE functional use;Coordination;FMC;Dexterity;Strength;ROM    Cognitive Skills  Attention;Memory;Emotional;Problem Solve;Safety Awareness    Rehab Potential  Good    Clinical Decision Making  Several treatment options, min-mod task modification necessary    Comorbidities Affecting Occupational Performance:  Presence of comorbidities impacting occupational performance    Comorbidities impacting occupational performance description:  Phyical, cognitive, visual,  medical  comorbidities    Modification or Assistance to Complete Evaluation   No modification of tasks or assist necessary to complete eval    OT Frequency  3x / week    OT Duration  12 weeks    OT Treatment/Interventions  Self-care/ADL training;DME and/or AE instruction;Therapeutic exercise;Therapeutic activities;Moist Heat;Cognitive remediation/compensation;Neuromuscular education;Visual/perceptual remediation/compensation;Coping strategies training;Patient/family education;Passive range of motion;Psychosocial skills training;Energy conservation;Functional Mobility Training    Consulted and Agree with Plan of Care  Patient    Family Member Consulted  Daughter [name]       Patient will benefit from skilled therapeutic intervention in order to improve the following deficits and impairments:   Body Structure / Function / Physical Skills: UE functional use, Coordination, FMC, Dexterity, Strength, ROM Cognitive Skills: Attention, Memory, Emotional, Problem Solve, Safety Awareness     Visit Diagnosis: Muscle weakness (generalized)  Other lack of coordination    Problem List Patient Active Problem List   Diagnosis Date Noted  . Sepsis ([place]) 10/18/2018    Harrel Carina, MS, OTR/L 11/28/2018, 5:27 PM  [place] MAIN [facility] SERVICES [address] [place], [place], [address] Phone: [phone]   Fax:  [phone]  Name: Kenneth Summers  MRN: [id number] Date of Birth: 1945/04/11

## 2018-12-02 ENCOUNTER — Encounter: Payer: Self-pay | Admitting: Occupational Therapy

## 2018-12-02 ENCOUNTER — Ambulatory Visit: Payer: Medicare Other | Admitting: Occupational Therapy

## 2018-12-02 ENCOUNTER — Other Ambulatory Visit: Payer: Self-pay

## 2018-12-02 ENCOUNTER — Encounter: Payer: Self-pay | Admitting: Physical Therapy

## 2018-12-02 ENCOUNTER — Ambulatory Visit: Payer: Medicare Other | Admitting: Physical Therapy

## 2018-12-02 DIAGNOSIS — R278 Other lack of coordination: Secondary | ICD-10-CM

## 2018-12-02 DIAGNOSIS — R262 Difficulty in walking, not elsewhere classified: Secondary | ICD-10-CM

## 2018-12-02 DIAGNOSIS — M6281 Muscle weakness (generalized): Secondary | ICD-10-CM

## 2018-12-02 DIAGNOSIS — R296 Repeated falls: Secondary | ICD-10-CM

## 2018-12-02 NOTE — Therapy (Addendum)
[place] MAIN [facility] SERVICES [address] [place], [place], [address] Phone: [phone]   Fax:  [phone]  Occupational Therapy Treatment  Patient Details  Name: Kenneth Summers MRN: [id number] Date of Birth: October 13, 1945 No data recorded  Encounter Date: 12/02/2018  OT End of Session - 12/02/18 1544    Visit Number  52    Number of Visits  50    Date for OT Re-Evaluation  01/15/19    Authorization Type  Progress report period starting 10/30/2018    OT Start Time  1345    OT Stop Time  1430    OT Time Calculation (min)  45 min    Activity Tolerance  Patient tolerated treatment well    Behavior During Therapy  [facility] for tasks assessed/performed       Past Medical History:  Diagnosis Date  . Alcohol abuse   . APS (antiphospholipid syndrome) ([place])   . BPH (benign prostatic hyperplasia)   . CAD (coronary artery disease)   . Cellulitis   . Cervical spinal stenosis   . Chronic osteomyelitis ([place])   . CKD (chronic kidney disease)   . CKD (chronic kidney disease)   . Clostridium difficile diarrhea   . Depression   . Diplopia   . Fatigue   . GERD (gastroesophageal reflux disease)   . Hyperlipemia   . Hypertension   . Hypovitaminosis D   . IBS (irritable bowel syndrome)   . IgA deficiency ([place])   . Insomnia   . Left lumbosacral radiculopathy   . Moderate obstructive sleep apnea   . Osteomyelitis of foot ([place])   . PAD (peripheral artery disease) ([place])   . Pruritus   . RA (rheumatoid arthritis) ([place])   . Radiculopathy   . Restless legs syndrome   . RLS (restless legs syndrome)   . SLE (systemic lupus erythematosus related syndrome) ([place])   . Stroke ([place])   . Toxic maculopathy of both eyes     Past Surgical History:  Procedure Laterality Date  . CARDIAC CATHETERIZATION    . CAROTID ENDARTERECTOMY    . CERVICAL LAMINECTOMY    . CORONARY ANGIOPLASTY    . FRACTURE SURGERY    . fusion C5-6-7    . HERNIA REPAIR    . KNEE  ARTHROSCOPY    . TONSILLECTOMY      There were no vitals filed for this visit.  Subjective Assessment - 12/02/18 1528    Subjective   Pt. reports being very fatigued after PT.    Patient is accompanied by:  Family member    Pertinent History  Pt. is a 73 y.o. 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 Pt. PMHx includes: Multiple Falls, Lupus, DJD, Renal Abscess, CVA. Pt. resides with his wife. Pt.'s wife and daughter assist with ADLs. Pt. has caregivers in  for 2 hours a day, 6 days a week. Pt. received Rehab services in acute care, at SNF for STR, and [place] services. Pt. is retired from [employer] for [employer] and [employer].    Patient Stated Goals  To regain use of his left UE, and do more for himself.    Currently in Pain?  No/denies      OT TREATMENT    Neuro muscular re-education:  Pt. worked on [facility] skills grasping 1/2" pegs, and placing them onto a pegboard following a design pattern. Pt. required verbal cues for placing the pegs following the design  pattern, as well as cues for engaging his left hand during the task. Pt. Worked on grasping coins from a tabletop surface, placing them into a resistive container, and pushing them through the slot while isolating his 2nd digit.   Selfcare:  Pt. worked on UE dressing tasks using hemi-dressing techniques. Pt. required fewercues for position when donning a zip down sweatshirt, as pt. Pt. Requiredfewerstep-by step verbal, and tactile cues. Pt. Required repositioning for the jacket around his back, at his trunk, as well as the for the position of the zipper  Response to Treatment:   Pt. presents with increased fatigue today following PT. Pt. presents required multiple restbreaks, with position changes. Pt. requires increased cues to engage his left hand during the tasks.  Pt. continues to require step by step cues, and assist to perfrom UE dressing. Pt. with improved ability to manage the zipper  today. Pt. continues to work on improving LUE functioning duirng ADLs, and IADL tasks.                     OT Education - 12/02/18 1544    Education Details  LUE functioning, [place], UE dressing    Person(s) Educated  Patient    Methods  Explanation;Demonstration    Comprehension  Returned demonstration;Tactile cues required;Verbalized understanding;Need further instruction          OT Long Term Goals - 10/28/18 1532      OT LONG TERM GOAL #1   Title  Pt. will increase left shoulder flexion AROM by 10 degrees to access cabinet/shelf.    Baseline  Pt. has difficulty reaching for items with the LUE secondary to decreased LUE ROM    Time  12    Period  Weeks    Status  On-going    Target Date  01/15/19      OT LONG TERM GOAL #2   Title  Pt. will donn a shirt with Supervision.    Baseline  Min    Time  12    Period  Weeks    Status  On-going    Target Date  01/15/19      OT LONG TERM GOAL #3   Title  Pt. will require ModA to perform LE dressing    Baseline  MaxA left, ModA with the right    Time  12    Period  Weeks    Status  On-going    Target Date  01/15/19      OT LONG TERM GOAL #4   Title  Pt. will improve left grip strength by 10# to be able to open a jar/container.    Baseline  Decreased grip since hospital readmission, pt. has difficulty opening containers    Time  12    Period  Weeks    Status  On-going    Target Date  01/15/19      OT LONG TERM GOAL #5   Title  Pt. will improve left hand [facility] skills to be able to assit with buttoning/zipping.    Baseline  Pt. has difficulty [facility] skills buttoning/zipping    Time  12    Period  Weeks    Status  On-going    Target Date  01/15/19      OT LONG TERM GOAL #6   Title  Pt. will demonstrate visual conmpensatory strategies during 100% of the time during ADLs    Baseline  Pt. requires cues for left sides awareness.    Time  12    Period  Weeks    Status  On-going    Target Date  01/15/19      OT  LONG TERM GOAL #7   Title  Pt. will prepare a simple cold snack from w/c with supervision using cognitive compensatory strategies 100% of the time.    Baseline  Pt. is unable    Time  12    Period  Weeks    Target Date  11/27/18      OT LONG TERM GOAL #8   Title  Pt. will accurately identify potential safety hazard using good safety awareness, and judgement 100% for ADLs, and IADLs.    Baseline  Limited    Time  12    Period  Weeks    Status  On-going    Target Date  11/27/18      OT LONG TERM GOAL  #9   TITLE  Pt. will  navigate his w/c around obstacles with Supervision and 100% accuracy    Baseline  Pt. requires cuing for obstacle on the left    Time  12    Period  Weeks    Status  On-going    Target Date  11/27/18            Plan - 12/02/18 1545    Clinical Impression Statement  Pt. presents with increased fatigue today following PT. Pt. presents required multiple restbreaks, with position changes. Pt. requires increased cues to engage his left hand during the tasks.  Pt. continues to require step by step cues, and assist to perfrom UE dressing. Pt. with improved ability to manage the zipper today. Pt. continues to work on improving LUE functioning duirng ADLs, and IADL tasks.    OT Occupational Profile and History  Problem Focused Assessment - Including review of records relating to presenting problem    Occupational performance deficits (Please refer to evaluation for details):  ADL's    Body Structure / Function / Physical Skills  UE functional use;Coordination;FMC;Dexterity;Strength;ROM    Cognitive Skills  Attention;Memory;Emotional;Problem Solve;Safety Awareness    Rehab Potential  Good    Clinical Decision Making  Several treatment options, min-mod task modification necessary    Comorbidities Affecting Occupational Performance:  Presence of comorbidities impacting occupational performance    Comorbidities impacting occupational performance description:  Phyical,  cognitive, visual,  medical comorbidities    Modification or Assistance to Complete Evaluation   No modification of tasks or assist necessary to complete eval    OT Frequency  3x / week    OT Duration  12 weeks    OT Treatment/Interventions  Self-care/ADL training;DME and/or AE instruction;Therapeutic exercise;Therapeutic activities;Moist Heat;Cognitive remediation/compensation;Neuromuscular education;Visual/perceptual remediation/compensation;Coping strategies training;Patient/family education;Passive range of motion;Psychosocial skills training;Energy conservation;Functional Mobility Training    Consulted and Agree with Plan of Care  Patient    Family Member Consulted  Daughter [name]       Patient will benefit from skilled therapeutic intervention in order to improve the following deficits and impairments:   Body Structure / Function / Physical Skills: UE functional use, Coordination, FMC, Dexterity, Strength, ROM Cognitive Skills: Attention, Memory, Emotional, Problem Solve, Safety Awareness     Visit Diagnosis: Muscle weakness (generalized)  Other lack of coordination    Problem List Patient Active Problem List   Diagnosis Date Noted  . Sepsis ([place]) 10/18/2018    Harrel Carina, MS, OTR/L 12/02/2018, 3:57 PM  [place] MAIN [facility] SERVICES [place], [place],  [place] Phone: [phone]   Fax:  [phone]  Name: Kenneth Summers MRN: [id number] Date of Birth: Sep 16, 1945

## 2018-12-02 NOTE — Therapy (Signed)
[place] MAIN [facility] SERVICES [address] [place], [place], [address] Phone: [phone]   Fax:  [phone]  Physical Therapy Treatment  Patient Details  Name: Kenneth Summers MRN: [id number] Date of Birth: 1945/06/01 Referring Provider (PT): Dr. [name], Guy Begin   Encounter Date: 12/02/2018  PT End of Session - 12/02/18 1252    Visit Number  43    Number of Visits  28    Date for PT Re-Evaluation  12/04/18    Authorization Type  Medicare reporting period starting 09/11/18    Authorization Time Period  Current cert period 04/10/3612-43/15/40    PT Start Time  1301    PT Stop Time  1345    PT Time Calculation (min)  44 min    Equipment Utilized During Treatment  Gait belt    Activity Tolerance  Patient tolerated treatment well    Behavior During Therapy  [facility] for tasks assessed/performed       Past Medical History:  Diagnosis Date  . Alcohol abuse   . APS (antiphospholipid syndrome) ([place])   . BPH (benign prostatic hyperplasia)   . CAD (coronary artery disease)   . Cellulitis   . Cervical spinal stenosis   . Chronic osteomyelitis ([place])   . CKD (chronic kidney disease)   . CKD (chronic kidney disease)   . Clostridium difficile diarrhea   . Depression   . Diplopia   . Fatigue   . GERD (gastroesophageal reflux disease)   . Hyperlipemia   . Hypertension   . Hypovitaminosis D   . IBS (irritable bowel syndrome)   . 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 Insomnia   . Left lumbosacral radiculopathy   . Moderate obstructive sleep apnea   . Osteomyelitis of foot ([place])   . PAD (peripheral artery disease) ([place])   . Pruritus   . RA (rheumatoid arthritis) ([place])   . Radiculopathy   . Restless legs syndrome   . RLS (restless legs syndrome)   . SLE (systemic lupus erythematosus related syndrome) ([place])   . Stroke ([place])   . Toxic maculopathy of both eyes     Past Surgical History:  Procedure Laterality Date  . CARDIAC CATHETERIZATION    .  CAROTID ENDARTERECTOMY    . CERVICAL LAMINECTOMY    . CORONARY ANGIOPLASTY    . FRACTURE SURGERY    . fusion C5-6-7    . HERNIA REPAIR    . KNEE ARTHROSCOPY    . TONSILLECTOMY      There were no vitals filed for this visit.  Subjective Assessment - 12/02/18 1302    Subjective  Patient reports doing well; His daughter reports he has been able to kick his leg out better and hold for longer time. He reports still having trouble with transfers.    Patient is accompained by:  Family member    Pertinent History  Patient is a 73 y.o. male who presents to outpatient physical therapy with a referral for medical diagnosis of CVA. This patient's chief complaints consist of left hemiplegia and overall deconditioning leading to the following functional deficits: dependent for ADLs, IADLs, unable to transfer to car, dependent transfers at home with hoyer lift, unable to walk or stand without significant assistance, difficulty with W/C navigation..    Limitations  Sitting;Lifting;Standing;House hold activities;Writing;Walking;Other (comment)    Diagnostic tests  Brain MRI 11/01/2017: IMPRESSION: 1. Acute right ACA territory infarct as demonstrated on prior CT imaging. No intracranial hemorrhage or significant mass effect.  2. 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    Currently in Pain?  No/denies    Pain Onset  More than a month ago    Multiple Pain Sites  No             TREATMENT: Therapeutic activity: -Forward weight shift x5 with hip clearance from chair, x 2 sets; able to accomplish ~2 inches of hip clearance from seat. Able to exhibit neutral positioning without lateral trunk lean;   -STS transfers x6 reps using parallel bar for BUE support, PT on left side to give TC for knee and hip extension;  Pt able to transfer with MinA progressing to CGA +1 with cues for weight shift and positioning; Patient able to progress with improved weight shift and quad  control;  Standing: -lateral weight shift with parallel bar support x10 reps with cues to increase quad activation with weight shift to stance leg. Pt able to exhibit better quad activation on LLE however is unable to fully straighten left leg; -attempted RLE marches however patient unable to fully stand on LLE due to fatigue with buckling of left knee;    Therapeutic exercise: Seated alternate marches x10 reps bilaterally with good AROM bilaterally including good ROM on LLE;  Standing: Mini squat with B rail assist with therapist blocking left knee for stability 2x5 with increased fatigue reported; patient does require cues for proper weight shift and positioning; He reports significant fatigue in bilateral quads with good  Muscle activation;  Pt educated throughout session about proper posture and technique with exercises. Improved exercise technique, movement at target joints, use of target muscles after min to mod verbal, visual, tactile cues.  Response to Treatment:  Patient demonstratesgoodmotivation throughout today's session. He does fatigue quickly requiring intermittent seated rest break; He also became short of breath with each transfer as patient is doing more work. Noted that patient was able to progress transfer to [place] +1 with parallel bars which is a significant improvement; Patient reports adherence with HEP and continues to be motivated to improve transfer ability and mobility;                     PT Education - 12/02/18 1252    Education Details  transfers, positioning, HEP, strengthening;    Person(s) Educated  Patient;Child(ren)    Methods  Explanation;Demonstration;Verbal cues    Comprehension  Verbalized understanding;Verbal cues required;Returned demonstration;Need further instruction       PT Short Term Goals - 11/18/18 1307      PT SHORT TERM GOAL #1   Title  Be independent with initial home exercise program for self-management of  symptoms.    Baseline  HEP to be given at second session; advice to practice unsupported sitting at home (06/19/2018); 07/08/18: Performing at home, 6/29: needs assistance but is doing exercise program regularly; 08/22/18: adherent;    Time  2    Period  Weeks    Status  Achieved    Target Date  06/26/18        PT Long Term Goals - 11/18/18 1309      PT LONG TERM GOAL #1   Title  Patient will complete all bed mobility with min A to improve functional independence for getting in and out of bed and adjusting in bed.     Baseline  max A - total A reported by family (06/19/2018); 07/08/18: still requires maxA, dtr reports improvement in rolling since starting therapy; 6/23:  min A to supervision in clinic; not doing at home right; 8/5: Pt's daughter states that his rolling has improved, but still has difficulty with all other bed mobility; 10/23/18: pts daughter states that she is doing about 35% of the work if he is in proper position, he does uses the bed rails to help roll;  using Harrel Lemon for coming to sit EOB; 10/12: pts daughter states that she is doing about 30% of the work if he is in proper position, he does uses the bed rails to help roll, but it is improving;  using Hoyer for coming to sit EOB    Time  12    Period  Weeks    Status  Partially Met    Target Date  12/04/18      PT LONG TERM GOAL #2   Title  Patient will complete sit <> stand transfer chair to chair with Min A and LRAD to improve functional independence for household and community mobility.     Baseline  max A +2 STS at W/C (06/12/2018); max A +2-3 STS or stand pivot transfer, able to complete more reps (06/19/2018); 07/08/18: not safe to attempt at this time; 09/11/18: requires maxA+1 or modA+2 for STSs and squat pivot transfer; 10/23/18: requires maxA+1 or modA+2 for STSs and squat pivot transfer; 10/12: requires maxA+1 or modA+2 for STSs and squat pivot transfer    Time  12    Period  Weeks    Status  Partially Met    Target Date   12/04/18      PT LONG TERM GOAL #3   Title  Patient will navigate power w/c with min A x 100 feet to improve mobility for household and short community distances.    Baseline  required total A (06/12/2018); able to roll 20 feet with min A (06/20/2018); 07/08/18: Pt can go approximately 10' without assist per family;  10/23/2018: pt's daughter states that his power WC mobility has become 100% better with the new hand control, slowed speed, and with practice; 10/12: pt's daughter reports that he can perform household WC mobility and limited community ambulation with minA    Time  12    Period  Weeks    Status  Achieved      PT LONG TERM GOAL #4   Title  Patient will complete car transfers W/C to family SUV with min A using LRAD to improve his abilty to participate in community activities.     Baseline  unable to complete car transfers at all (06/12/2018); unable to complete car transfers at all (06/19/2018).; 07/08/18: Unable to perform at this time; 09/11/18: unable to perform at this time; 10/23/2018: unable to complete car transfers at all; 10/12:  unable to complete car transfers at all    Time  12    Period  Weeks    Status  On-going      PT LONG TERM GOAL #5   Title  Patient will ambulate at least 150 feet with mod I using LRAD to improve mobility for household and community distances.     Baseline  unable to take steps (06/12/2018); able to shift R leg forward and back with max A and RW at edge of plinth (06/19/2018); 07/08/18: unable to ambulate at this time. 09/11/18: unable to perform at this time; 10/23/2018: unable to perform at this time; 10/12: unable to perform at this time    Time  12    Period  Weeks    Status  On-going      PT LONG TERM GOAL #6   Title  Patient will navigate 4 steps with min A and BUE support to improve his ability to access community and social participation.     Baseline  unable to complet stairs (06/12/2018); unable to complete stairs (06/19/2018); 07/08/18: unable to attempt at  this time; 09/11/18: unsafe to attempt at this time; 10/23/2018: unable to complet stairs (06/12/2018); unable to complete stairs (06/19/2018); 07/08/18: unable to attempt at this time; 09/11/18: unsafe to attempt at this time; 10/23/18: unsafe to attempt at this time    Time  12    Period  Weeks    Status  On-going      PT LONG TERM GOAL #7   Title  Patient will increase functional reach test to >15 inches in sitting to exhibit improved sitting balance and positioning and reduce fall risk;    Baseline  07/30/18: 12 inches; 09/11/18: 10-12 inches; 10/23/18: 12-13 inch; 10/12: 14.5"    Time  4    Period  Weeks    Status  Partially Met    Target Date  12/04/18            Plan - 12/02/18 1520    Clinical Impression Statement  Patient motivated and participated well within session. he progressed sit<>Stand transfers this session with being able to complete at parallel bars with CGA with therapist blocking left knee for better quad control/knee stability. Patient able to progress standing exercises to mini squats with increased fatigue reported. He requires intermittent short rest breaks with increased shortness of breath and fatigue. reinforced HEP; Patient would benefit from additional skilled PT intervention to improve strength, balance and mobility;    Personal Factors and Comorbidities  Age;Comorbidity 3+    Comorbidities  Relevant past medical history and comorbidities include long term steroid use for lupus, CKD, chronic osteomyelitis, cervical spine stenosis, BPH, APS, alcohol abuse, peripheral artery disease, depression, GERD, obstructive sleep apnea, HTN, IBS, IgA deficiency, lumbar radiculopathy, RA, Stroke, toxic maculopathy in both eyes, systemic lupus erythematosus related syndrome, cardiac catheterization, carotid endarterectomy, cervical laminectomy, coronary angioplasty, Fusion C5-C7, knee arthroplasty, lumbar surgery.    Examination-Activity Limitations  Bed  Mobility;Bend;Sit;Toileting;Stand;Stairs;Lift;Transfers;Squat;Locomotion Level;Carry;Dressing;Hygiene/Grooming;Continence    Examination-Participation Restrictions  Yard Work;Interpersonal Relationship;Community Activity    Stability/Clinical Decision Making  Evolving/Moderate complexity    Rehab Potential  Fair    PT Frequency  3x / week    PT Duration  12 weeks    PT Treatment/Interventions  ADLs/Self Care Home Management;Electrical Stimulation;Moist Heat;Cryotherapy;Gait training;Stair training;Functional mobility training;Therapeutic exercise;Balance training;Neuromuscular re-education;Cognitive remediation;Patient/family education;Orthotic Fit/Training;Wheelchair mobility training;Manual techniques;Passive range of motion;Energy conservation;Joint Manipulations    PT Next Visit Plan  Work on strength, sitting/standing balance, functional activities such as rolling, bed mobility, transfers; caregiver training    PT Home Exercise Plan  handout provided via Beth during previous sessions    Consulted and Agree with Plan of Care  Patient;Family member/caregiver    Family Member Consulted  daughter [name]       Patient will benefit from skilled therapeutic intervention in order to improve the following deficits and impairments:  Abnormal gait, Decreased activity tolerance, Decreased cognition, Decreased endurance, Decreased knowledge of use of DME, Decreased range of motion, Decreased skin integrity, Decreased strength, Impaired perceived functional ability, Impaired sensation, Impaired UE functional use, Improper body mechanics, Pain, Cardiopulmonary status limiting activity, Decreased balance, Decreased coordination, Decreased mobility, Difficulty walking, Impaired tone, Postural dysfunction  Visit Diagnosis: Muscle weakness (generalized)  Other lack of  coordination  Difficulty in walking, not elsewhere classified  Repeated falls     Problem List Patient Active Problem List   Diagnosis  Date Noted  . Sepsis ([place]) 10/18/2018    Trotter,Margaret PT, DPT 12/02/2018, 3:21 PM  [place] MAIN [facility] SERVICES [address] [place], [place], [address] Phone: [phone]   Fax:  [phone]  Name: Kenneth Summers MRN: [id number] Date of Birth: 08/11/1945

## 2018-12-04 ENCOUNTER — Ambulatory Visit: Payer: Medicare Other | Admitting: Occupational Therapy

## 2018-12-04 ENCOUNTER — Ambulatory Visit: Payer: Medicare Other | Admitting: Physical Therapy

## 2018-12-04 ENCOUNTER — Encounter: Payer: Self-pay | Admitting: Physical Therapy

## 2018-12-04 ENCOUNTER — Encounter: Payer: Self-pay | Admitting: Occupational Therapy

## 2018-12-04 ENCOUNTER — Other Ambulatory Visit: Payer: Self-pay

## 2018-12-04 DIAGNOSIS — R262 Difficulty in walking, not elsewhere classified: Secondary | ICD-10-CM

## 2018-12-04 DIAGNOSIS — M6281 Muscle weakness (generalized): Secondary | ICD-10-CM | POA: Diagnosis not present

## 2018-12-04 DIAGNOSIS — R278 Other lack of coordination: Secondary | ICD-10-CM

## 2018-12-04 DIAGNOSIS — R296 Repeated falls: Secondary | ICD-10-CM

## 2018-12-04 NOTE — Therapy (Signed)
[place] MAIN [facility] SERVICES [address] [place], [place], [address] Phone: [phone]   Fax:  [phone]  Physical Therapy Treatment  Patient Details  Name: Kenneth Summers MRN: [id number] Date of Birth: August 05, 1945 Referring Provider (PT): Dr. [name], Guy Begin   Encounter Date: 12/04/2018  PT End of Session - 12/04/18 1302    Visit Number  [id number]    Number of Visits  108    Date for PT Re-Evaluation  02/27/19    Authorization Type  Medicare reporting period starting 09/11/18    Authorization Time Period  goals last assessed 12/04/18    PT Start Time  1302    PT Stop Time  1345    PT Time Calculation (min)  43 min    Equipment Utilized During Treatment  Gait belt    Activity Tolerance  Patient tolerated treatment well    Behavior During Therapy  [facility] for tasks assessed/performed       Past Medical History:  Diagnosis Date  . Alcohol abuse   . APS (antiphospholipid syndrome) ([place])   . BPH (benign prostatic hyperplasia)   . CAD (coronary artery disease)   . Cellulitis   . Cervical spinal stenosis   . Chronic osteomyelitis ([place])   . CKD (chronic kidney disease)   . CKD (chronic kidney disease)   . Clostridium difficile diarrhea   . Depression   . Diplopia   . Fatigue   . GERD (gastroesophageal reflux disease)   . Hyperlipemia   . Hypertension   . Hypovitaminosis D   . IBS (irritable bowel syndrome)   . 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 Insomnia   . Left lumbosacral radiculopathy   . Moderate obstructive sleep apnea   . Osteomyelitis of foot ([place])   . PAD (peripheral artery disease) ([place])   . Pruritus   . RA (rheumatoid arthritis) ([place])   . Radiculopathy   . Restless legs syndrome   . RLS (restless legs syndrome)   . SLE (systemic lupus erythematosus related syndrome) ([place])   . Stroke ([place])   . Toxic maculopathy of both eyes     Past Surgical History:  Procedure Laterality Date  . CARDIAC CATHETERIZATION    . CAROTID  ENDARTERECTOMY    . CERVICAL LAMINECTOMY    . CORONARY ANGIOPLASTY    . FRACTURE SURGERY    . fusion C5-6-7    . HERNIA REPAIR    . KNEE ARTHROSCOPY    . TONSILLECTOMY      There were no vitals filed for this visit.  Subjective Assessment - 12/04/18 1306    Subjective  Patient reports doing well; Denies any pain/soreness. reports working on trying to stand at counter but otherwise has not been doing the sitting edge of bed exercise.    Patient is accompained by:  Family member    Pertinent History  Patient is a 73 y.o. male who presents to outpatient physical therapy with a referral for medical diagnosis of CVA. This patient's chief complaints consist of left hemiplegia and overall deconditioning leading to the following functional deficits: dependent for ADLs, IADLs, unable to transfer to car, dependent transfers at home with hoyer lift, unable to walk or stand without significant assistance, difficulty with W/C navigation..    Limitations  Sitting;Lifting;Standing;House hold activities;Writing;Walking;Other (comment)    Diagnostic tests  Brain MRI 11/01/2017: IMPRESSION: 1. Acute right ACA territory infarct as demonstrated on prior CT imaging. No intracranial hemorrhage or significant mass effect. 2. Age  advanced global brain atrophy and small chronic right occipital Infarct.    Currently in Pain?  No/denies    Pain Onset  More than a month ago    Multiple Pain Sites  No         [facility] PT Assessment - 12/05/18 0001      Strength   Right Hip Flexion  4+/5    Left Hip Flexion  3-/5    Right Knee Flexion  4/5    Right Knee Extension  4/5    Left Knee Extension  2+/5           PT assessed transfers, bed mobility, functional reach and other outcome measures to address goals. See below:    TREATMENT:  Therapeutic activity: Patient transferred squat pivot wheelchair<>mat table with min A +1 with mod VCs for proper hand placement, positioning and set up; Patient able to lean  forward well and exhibits better positioning and safety awareness; Upon sitting edge of mat table, he required min A for scooting forward to sit straight and then was able to sit unsupported with feet flat on floor with good trunk control;  Forward reach test: 15.5 inches  Instructed patient in transition sit to sidelying with RLE crossed behind LLE to assist with lifting LLE onto mat; Patient able to transition sit to sidelying with min A to lift feet onto mat table (last 2-3 inches) otherwise he is able to complete well with good control;  Rolled to supine mod I with good LE positioning;  Instructed patient in rolling supine to right sidelying, supervision with significant increase time required with mod VCs for LLE positioning and reaching with LUE; Patient requires increased time to tilt LLE over to assist with rolling; Instructed patient in sidelying to sitting edge of mat table, able to slide LE off table well without assistance, does require 1 HHA to pull self up to sitting but able to initiate transition well with good UE push and trunk control;   Patient transferred mat table to wheelchair, squat pivot transfer with min A +1 with cues for hand placement and LE positioning;   -Forward weight shift x3 with hip clearance from chair, x1 sets;   -STS transfers x1 reps with RW, min A +1 with therapist blocking left knee and mod VCs for forward weight shift and to increase push through RLE; Patient able to exhibit better trunk/hip extension upon standing; Does require 2nd person to hold RW for safety; Able to stand for approximately 15-20 sec with good control, CGA with therapist blocking LLE knee for safety;     Therapeutic exercise: Seated alternate marches x10 reps bilaterally with good AROM bilaterally including good ROM on LLE; Seated LAQ x5 reps bilaterally, able to exhibit approximately 75% full ROM on LLE with good quad initiation;   Supine: SLR hip flexion x10 reps RLE, AAROM x5  reps LLE; Bridges arms by side x15 reps Lumbar trunk rotation x5 reps supervision;  Patient requires min VCs for correct exercise technique and positioning; Patient is able to exhibit improved muscle activation and AROM in LLE compared to previous sessions;      Pt educated throughout session about proper posture and technique with exercises. Improved exercise technique, movement at target joints, use of target muscles after min to mod verbal, visual, tactile cues.    Response to Treatment:   Patient demonstrates good motivation throughout today's session. He does fatigue quickly requiring intermittent seated rest break; Patient exhibits significant improvement in functional mobility with  improved LLE mobility and better trunk control. He is also exhibiting improved sitting balance with better forward reach; Patient is also progressing with transfers; while he still needs some assistance, he is able to complete squat pivot and sit<>Stand transfer with 1 person assist rather than 2 person assist and exhibits improved motor activation and positioning. Patient does continue to require increased time for motor planning and cues for proper positioning;                  PT Education - 12/04/18 1302    Education Details  transfers/positioning/progress towards goals, HEP    Person(s) Educated  Patient;Child(ren)    Methods  Explanation;Verbal cues;Demonstration    Comprehension  Verbalized understanding;Returned demonstration;Verbal cues required;Need further instruction       PT Short Term Goals - 12/04/18 1303      PT SHORT TERM GOAL #1   Title  Be independent with initial home exercise program for self-management of symptoms.    Baseline  HEP to be given at second session; advice to practice unsupported sitting at home (06/19/2018); 07/08/18: Performing at home, 6/29: needs assistance but is doing exercise program regularly; 08/22/18: adherent;    Time  2    Period  Weeks    Status   Achieved    Target Date  06/26/18        PT Long Term Goals - 12/04/18 1303      PT LONG TERM GOAL #1   Title  Patient will complete all bed mobility with min A to improve functional independence for getting in and out of bed and adjusting in bed.     Baseline  max A - total A reported by family (06/19/2018); 07/08/18: still requires maxA, dtr reports improvement in rolling since starting therapy; 6/23: min A to supervision in clinic; not doing at home right; 8/5: Pt's daughter states that his rolling has improved, but still has difficulty with all other bed mobility; 10/23/18: pts daughter states that she is doing about 35% of the work if he is in proper position, he does uses the bed rails to help roll;  using Hoyer for coming to sit EOB; 10/12: pts daughter states that she is doing about 30% of the work if he is in proper position, he does uses the bed rails to help roll, but it is improving;  using Hoyer for coming to sit EOB, 10/29: min A for sit to sidelying, rolling supervision, able to transition sidelying to sit with HHA;    Time  12    Period  Weeks    Status  Achieved      PT LONG TERM GOAL #2   Title  Patient will complete sit <> stand transfer chair to chair with Min A and LRAD to improve functional independence for household and community mobility.     Baseline  max A +2 STS at W/C (06/12/2018); max A +2-3 STS or stand pivot transfer, able to complete more reps (06/19/2018); 07/08/18: not safe to attempt at this time; 09/11/18: requires maxA+1 or modA+2 for STSs and squat pivot transfer; 10/23/18: requires maxA+1 or modA+2 for STSs and squat pivot transfer; 10/12: requires maxA+1 or modA+2 for STSs and squat pivot transfer; 10/29: patient able to complete sit<>Stand transfer min A +1 with parallel bars and RW, although inconsistent, mod A for stand pivot;    Time  12    Period  Weeks    Status  Partially Met    Target Date  02/27/19      PT LONG TERM GOAL #3   Title  Patient will navigate  power w/c with min A x 100 feet to improve mobility for household and short community distances.    Baseline  required total A (06/12/2018); able to roll 20 feet with min A (06/20/2018); 07/08/18: Pt can go approximately 10' without assist per family;  10/23/2018: pt's daughter states that his power WC mobility has become 100% better with the new hand control, slowed speed, and with practice; 10/12: pt's daughter reports that he can perform household WC mobility and limited community ambulation with minA    Time  12    Period  Weeks    Status  Achieved      PT LONG TERM GOAL #4   Title  Patient will complete car transfers W/C to family SUV with min A using LRAD to improve his abilty to participate in community activities.     Baseline  unable to complete car transfers at all (06/12/2018); unable to complete car transfers at all (06/19/2018).; 07/08/18: Unable to perform at this time; 09/11/18: unable to perform at this time; 10/23/2018: unable to complete car transfers at all; 10/12:  unable to complete car transfers at all, 10/29: unable    Time  12    Period  Weeks    Status  On-going    Target Date  02/27/19      PT LONG TERM GOAL #5   Title  Patient will ambulate at least 150 feet with mod I using LRAD to improve mobility for household and community distances.     Baseline  unable to take steps (06/12/2018); able to shift R leg forward and back with max A and RW at edge of plinth (06/19/2018); 07/08/18: unable to ambulate at this time. 09/11/18: unable to perform at this time; 10/23/2018: unable to perform at this time; 10/12: unable to perform at this time, 10/29: unable at this time    Time  12    Period  Weeks    Status  On-going    Target Date  02/27/19      Additional Long Term Goals   Additional Long Term Goals  Yes      PT LONG TERM GOAL #6   Title  Patient will be able to safety navigate up/down ramp with power chair, exhibiting good safety awareness, mod I to safely enter/exit his home.    Baseline   unable to complet stairs (06/12/2018); unable to complete stairs (06/19/2018); 07/08/18: unable to attempt at this time; 09/11/18: unsafe to attempt at this time; 10/23/2018: unable to complet stairs (06/12/2018); unable to complete stairs (06/19/2018); 07/08/18: unable to attempt at this time; 09/11/18: unsafe to attempt at this time; 10/23/18: unsafe to attempt at this time, 10/29: requires superivison and occasional min A for safety    Time  12    Period  Weeks    Status  Revised    Target Date  02/27/19      PT LONG TERM GOAL #7   Title  Patient will increase functional reach test to >15 inches in sitting to exhibit improved sitting balance and positioning and reduce fall risk;    Baseline  07/30/18: 12 inches; 09/11/18: 10-12 inches; 10/23/18: 12-13 inch; 10/12: 14.5", 10/29: 15.5 inch    Time  4    Period  Weeks    Status  Achieved      PT LONG TERM GOAL #8   Title  Patient will  increase LLE quad strength to 3+/5 to improve functional strength for standing and mobility;    Baseline  10/28: 2+/5    Time  12    Period  Weeks    Status  New    Target Date  02/27/19            Plan - 12/05/18 0819    Clinical Impression Statement  Patient motivated and participated well within session. He is exhibiting improvement in functional mobility including improved bed mobility and transfer ability. He also exhibits significant improvement in forward reach exhibiting improved sitting balance. Patient is progressing in LE strength, although continues to have significant weakness in BLE which limits mobility. He fatigues quickly with repeated transfers requiring short seated rest breaks. Patient does continue to require therapist cues/instruction for safety awareness and positioning for optimal motor control and ADL ability. He would benefit from additional skilled PT intervention to improve strength, balance and mobility;    Personal Factors and Comorbidities  Age;Comorbidity 3+    Comorbidities  Relevant past  medical history and comorbidities include long term steroid use for lupus, CKD, chronic osteomyelitis, cervical spine stenosis, BPH, APS, alcohol abuse, peripheral artery disease, depression, GERD, obstructive sleep apnea, HTN, IBS, IgA deficiency, lumbar radiculopathy, RA, Stroke, toxic maculopathy in both eyes, systemic lupus erythematosus related syndrome, cardiac catheterization, carotid endarterectomy, cervical laminectomy, coronary angioplasty, Fusion C5-C7, knee arthroplasty, lumbar surgery.    Examination-Activity Limitations  Bed Mobility;Bend;Sit;Toileting;Stand;Stairs;Lift;Transfers;Squat;Locomotion Level;Carry;Dressing;Hygiene/Grooming;Continence    Examination-Participation Restrictions  Yard Work;Interpersonal Relationship;Community Activity    Stability/Clinical Decision Making  Evolving/Moderate complexity    Rehab Potential  Fair    PT Frequency  3x / week    PT Duration  12 weeks    PT Treatment/Interventions  ADLs/Self Care Home Management;Electrical Stimulation;Moist Heat;Cryotherapy;Gait training;Stair training;Functional mobility training;Therapeutic exercise;Balance training;Neuromuscular re-education;Cognitive remediation;Patient/family education;Orthotic Fit/Training;Wheelchair mobility training;Manual techniques;Passive range of motion;Energy conservation;Joint Manipulations    PT Next Visit Plan  Work on strength, sitting/standing balance, functional activities such as rolling, bed mobility, transfers; caregiver training    PT Home Exercise Plan  handout provided via Beth during previous sessions    Consulted and Agree with Plan of Care  Patient;Family member/caregiver    Family Member Consulted  daughter [name]       Patient will benefit from skilled therapeutic intervention in order to improve the following deficits and impairments:  Abnormal gait, Decreased activity tolerance, Decreased cognition, Decreased endurance, Decreased knowledge of use of DME, Decreased range of  motion, Decreased skin integrity, Decreased strength, Impaired perceived functional ability, Impaired sensation, Impaired UE functional use, Improper body mechanics, Pain, Cardiopulmonary status limiting activity, Decreased balance, Decreased coordination, Decreased mobility, Difficulty walking, Impaired tone, Postural dysfunction  Visit Diagnosis: Muscle weakness (generalized)  Other lack of coordination  Difficulty in walking, not elsewhere classified  Repeated falls     Problem List Patient Active Problem List   Diagnosis Date Noted  . Sepsis ([place]) 10/18/2018    , PT, DPT 12/05/2018, 8:26 AM  [place] MAIN [facility] SERVICES [address] [place], [place], [address] Phone: [phone]   Fax:  [phone]  Name: Kenneth Summers MRN: [id number] Date of Birth: 1945-09-23

## 2018-12-04 NOTE — Therapy (Addendum)
[place] MAIN [facility] SERVICES [address] [place], [place], [address] Phone: [phone]   Fax:  [phone]  Occupational Therapy Treatment  Patient Details  Name: Kenneth Summers MRN: [id number] Date of Birth: 07/25/1945 No data recorded  Encounter Date: 12/04/2018  OT End of Session - 12/04/18 1446    Visit Number  6    Number of Visits  34   Date for OT Re-Evaluation  01/15/19    Authorization Type  Progress report period starting 10/30/2018    OT Start Time  1350    OT Stop Time  1430    OT Time Calculation (min)  40 min    Activity Tolerance  Patient tolerated treatment well    Behavior During Therapy  [facility] for tasks assessed/performed       Past Medical History:  Diagnosis Date  . Alcohol abuse   . APS (antiphospholipid syndrome) ([place])   . BPH (benign prostatic hyperplasia)   . CAD (coronary artery disease)   . Cellulitis   . Cervical spinal stenosis   . Chronic osteomyelitis ([place])   . CKD (chronic kidney disease)   . CKD (chronic kidney disease)   . Clostridium difficile diarrhea   . Depression   . Diplopia   . Fatigue   . GERD (gastroesophageal reflux disease)   . Hyperlipemia   . Hypertension   . Hypovitaminosis D   . IBS (irritable bowel syndrome)   . 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 Insomnia   . Left lumbosacral radiculopathy   . Moderate obstructive sleep apnea   . Osteomyelitis of foot ([place])   . PAD (peripheral artery disease) ([place])   . Pruritus   . RA (rheumatoid arthritis) ([place])   . Radiculopathy   . Restless legs syndrome   . RLS (restless legs syndrome)   . SLE (systemic lupus erythematosus related syndrome) ([place])   . Stroke ([place])   . Toxic maculopathy of both eyes     Past Surgical History:  Procedure Laterality Date  . CARDIAC CATHETERIZATION    . CAROTID ENDARTERECTOMY    . CERVICAL LAMINECTOMY    . CORONARY ANGIOPLASTY    . FRACTURE SURGERY    . fusion C5-6-7    . HERNIA REPAIR    . KNEE  ARTHROSCOPY    . TONSILLECTOMY      There were no vitals filed for this visit.  Subjective Assessment - 12/04/18 1446    Subjective   Pt. reports being very fatigued after PT.    Patient is accompanied by:  Family member    Pertinent History  Pt. is a 73 y.o. 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 Pt. PMHx includes: Multiple Falls, Lupus, DJD, Renal Abscess, CVA. Pt. resides with his wife. Pt.'s wife and daughter assist with ADLs. Pt. has caregivers in  for 2 hours a day, 6 days a week. Pt. received Rehab services in acute care, at SNF for STR, and [place] services. Pt. is retired from [employer] for [employer] and [employer].    Currently in Pain?  No/denies      OT TREATMENT    Selfcare:  Pt. Worked on holding and using a buttonhook with his left hand to button a shirt with medium to small sized buttons.Pt. worked on UE dressing tasks using hemi-dressing techniques. Pt. required fewercues for position when donning a zip down sweatshirt, as pt. Pt. requiredfewerstep-by step verbal, and tactile cues.Pt. Required  min-mod assist to position the jacket around his back, at his trunk, as well as the for the position of the zipper.  Response to Treatment:  Pt. was able to tolerate the therapy session well today. Pt. required fewer cues to initiate threading his left arm through the jacket, and fewer cues pull the jacket all the way up his arm to his shoulder, around his neck. Pt. required min-modA to pull the jacket down in the back, and reposiiton the jacket in order to prepare for managing the zipper. Pt. required visual, and verbal, and tactile cues to manage a buttonhook to manage zippers. Pt. continues to work on improving overall ADL, and IADL functioning.                      OT Education - 12/04/18 1446    Education Details  LUE functioning, [place], UE dressing    Person(s) Educated  Patient    Methods   Explanation;Demonstration    Comprehension  Returned demonstration;Tactile cues required;Verbalized understanding;Need further instruction          OT Long Term Goals - 10/28/18 1532      OT LONG TERM GOAL #1   Title  Pt. will increase left shoulder flexion AROM by 10 degrees to access cabinet/shelf.    Baseline  Pt. has difficulty reaching for items with the LUE secondary to decreased LUE ROM    Time  12    Period  Weeks    Status  On-going    Target Date  01/15/19      OT LONG TERM GOAL #2   Title  Pt. will donn a shirt with Supervision.    Baseline  Min    Time  12    Period  Weeks    Status  On-going    Target Date  01/15/19      OT LONG TERM GOAL #3   Title  Pt. will require ModA to perform LE dressing    Baseline  MaxA left, ModA with the right    Time  12    Period  Weeks    Status  On-going    Target Date  01/15/19      OT LONG TERM GOAL #4   Title  Pt. will improve left grip strength by 10# to be able to open a jar/container.    Baseline  Decreased grip since hospital readmission, pt. has difficulty opening containers    Time  12    Period  Weeks    Status  On-going    Target Date  01/15/19      OT LONG TERM GOAL #5   Title  Pt. will improve left hand [facility] skills to be able to assit with buttoning/zipping.    Baseline  Pt. has difficulty [facility] skills buttoning/zipping    Time  12    Period  Weeks    Status  On-going    Target Date  01/15/19      OT LONG TERM GOAL #6   Title  Pt. will demonstrate visual conmpensatory strategies during 100% of the time during ADLs    Baseline  Pt. requires cues for left sides awareness.    Time  12    Period  Weeks    Status  On-going    Target Date  01/15/19      OT LONG TERM GOAL #7   Title  Pt. will prepare a simple cold snack from w/c with supervision using  cognitive compensatory strategies 100% of the time.    Baseline  Pt. is unable    Time  12    Period  Weeks    Target Date  11/27/18      OT LONG TERM GOAL  #8   Title  Pt. will accurately identify potential safety hazard using good safety awareness, and judgement 100% for ADLs, and IADLs.    Baseline  Limited    Time  12    Period  Weeks    Status  On-going    Target Date  11/27/18      OT LONG TERM GOAL  #9   TITLE  Pt. will  navigate his w/c around obstacles with Supervision and 100% accuracy    Baseline  Pt. requires cuing for obstacle on the left    Time  12    Period  Weeks    Status  On-going    Target Date  11/27/18            Plan - 12/04/18 1447    Clinical Impression Statement Pt. was able to tolerate the therapy session well today. Pt. required fewer cues to initiate threading his left arm through the jacket, and fewer cues pull the jacket all the way up his arm to his shoulder, around his neck. Pt. required min-modA to pull the jacket down in the back, and reposiiton the jacket in order to prepare for managing the zipper. Pt. required visual, and verbal, and tactile cues to manage a buttonhook to manage zippers. Pt. continues to work on improving overall ADL, and IADL functioning.    OT Occupational Profile and History  Problem Focused Assessment - Including review of records relating to presenting problem    Occupational performance deficits (Please refer to evaluation for details):  ADL's    Body Structure / Function / Physical Skills  UE functional use;Coordination;FMC;Dexterity;Strength;ROM    Cognitive Skills  Attention;Memory;Emotional;Problem Solve;Safety Awareness    Rehab Potential  Good    Clinical Decision Making  Several treatment options, min-mod task modification necessary    Comorbidities Affecting Occupational Performance:  Presence of comorbidities impacting occupational performance    Comorbidities impacting occupational performance description:  Phyical, cognitive, visual,  medical comorbidities    Modification or Assistance to Complete Evaluation   No modification of tasks or assist necessary to complete  eval    OT Frequency  3x / week    OT Duration  12 weeks    OT Treatment/Interventions  Self-care/ADL training;DME and/or AE instruction;Therapeutic exercise;Therapeutic activities;Moist Heat;Cognitive remediation/compensation;Neuromuscular education;Visual/perceptual remediation/compensation;Coping strategies training;Patient/family education;Passive range of motion;Psychosocial skills training;Energy conservation;Functional Mobility Training    Consulted and Agree with Plan of Care  Patient    Family Member Consulted  Daughter [name]       Patient will benefit from skilled therapeutic intervention in order to improve the following deficits and impairments:   Body Structure / Function / Physical Skills: UE functional use, Coordination, FMC, Dexterity, Strength, ROM Cognitive Skills: Attention, Memory, Emotional, Problem Solve, Safety Awareness     Visit Diagnosis: Muscle weakness (generalized)  Other lack of coordination    Problem List Patient Active Problem List   Diagnosis Date Noted  . Sepsis ([place]) 10/18/2018    Harrel Carina, MS, OTR/L 12/04/2018, 3:06 PM  [place] MAIN [facility] SERVICES [address] [place], [place], [address] Phone: [phone]   Fax:  [phone]  Name: Kenneth Summers MRN: [id number] Date of Birth: 1945/05/31

## 2018-12-05 ENCOUNTER — Ambulatory Visit: Payer: Medicare Other

## 2018-12-05 ENCOUNTER — Other Ambulatory Visit: Payer: Self-pay

## 2018-12-05 ENCOUNTER — Ambulatory Visit: Payer: Medicare Other | Admitting: Occupational Therapy

## 2018-12-05 ENCOUNTER — Encounter: Payer: Self-pay | Admitting: Occupational Therapy

## 2018-12-05 DIAGNOSIS — M6281 Muscle weakness (generalized): Secondary | ICD-10-CM | POA: Diagnosis not present

## 2018-12-05 DIAGNOSIS — R262 Difficulty in walking, not elsewhere classified: Secondary | ICD-10-CM

## 2018-12-05 DIAGNOSIS — R278 Other lack of coordination: Secondary | ICD-10-CM

## 2018-12-05 NOTE — Therapy (Signed)
[place] MAIN [facility] SERVICES [address] [place], [place], [address] Phone: [phone]   Fax:  [phone]  Physical Therapy Treatment  Patient Details  Name: Kenneth Summers MRN: [id number] Date of Birth: 01-Jul-1945 Referring Provider (PT): Dr. [name], Guy Begin   Encounter Date: 12/05/2018  PT End of Session - 12/05/18 1707    Visit Number  58    Number of Visits  108    Date for PT Re-Evaluation  02/27/19    Authorization Type  Medicare reporting period starting 09/11/18    Authorization Time Period  goals last assessed 12/04/18    PT Start Time  1345    PT Stop Time  1430    PT Time Calculation (min)  45 min    Equipment Utilized During Treatment  Gait belt    Activity Tolerance  Patient tolerated treatment well;Patient limited by fatigue    Behavior During Therapy  [facility] for tasks assessed/performed       Past Medical History:  Diagnosis Date  . Alcohol abuse   . APS (antiphospholipid syndrome) ([place])   . BPH (benign prostatic hyperplasia)   . CAD (coronary artery disease)   . Cellulitis   . Cervical spinal stenosis   . Chronic osteomyelitis ([place])   . CKD (chronic kidney disease)   . CKD (chronic kidney disease)   . Clostridium difficile diarrhea   . Depression   . Diplopia   . Fatigue   . GERD (gastroesophageal reflux disease)   . Hyperlipemia   . Hypertension   . Hypovitaminosis D   . IBS (irritable bowel syndrome)   . 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 Insomnia   . Left lumbosacral radiculopathy   . Moderate obstructive sleep apnea   . Osteomyelitis of foot ([place])   . PAD (peripheral artery disease) ([place])   . Pruritus   . RA (rheumatoid arthritis) ([place])   . Radiculopathy   . Restless legs syndrome   . RLS (restless legs syndrome)   . SLE (systemic lupus erythematosus related syndrome) ([place])   . Stroke ([place])   . Toxic maculopathy of both eyes     Past Surgical History:  Procedure Laterality Date  . CARDIAC  CATHETERIZATION    . CAROTID ENDARTERECTOMY    . CERVICAL LAMINECTOMY    . CORONARY ANGIOPLASTY    . FRACTURE SURGERY    . fusion C5-6-7    . HERNIA REPAIR    . KNEE ARTHROSCOPY    . TONSILLECTOMY      There were no vitals filed for this visit.  Subjective Assessment - 12/05/18 1501    Subjective  Patient reports doing well; Denies any pain/soreness. no new questions or concerns at this time.    Patient is accompained by:  Family member    Pertinent History  Patient is a 73 y.o. male who presents to outpatient physical therapy with a referral for medical diagnosis of CVA. This patient's chief complaints consist of left hemiplegia and overall deconditioning leading to the following functional deficits: dependent for ADLs, IADLs, unable to transfer to car, dependent transfers at home with hoyer lift, unable to walk or stand without significant assistance, difficulty with W/C navigation..    Limitations  Sitting;Lifting;Standing;House hold activities;Writing;Walking;Other (comment)    Diagnostic tests  Brain MRI 11/01/2017: IMPRESSION: 1. Acute right ACA territory infarct as demonstrated on prior CT imaging. No intracranial hemorrhage or significant mass effect. 2. 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    Currently in Pain?  No/denies    Pain Onset  --       TREATMENT  Ther-ex  STS transfers x3using parallel bar for BUE support, SPT on pt'sleftside and PT in front of pt providing minA+2 on first 2 and minA on last set to come to stand from [facility].Mod VC were provided throughout for technique to pull his hips forward and stand up tall. CGA+2 with intermittent min/modA+2 was provided once in standing. He stood for45s forthefirstset, 50s during the 2nd set, 30s during the 3rd set.  STS from [facility] to RW x3 with minA+2;  Intermittent cueing for hand placement on chair and RW.  Mod VC were provided throughout for technique to pull his hips forward and  stand up tall.   Transfer from [facility] to mat table modA+1 and cueing for anterior weight shift and hand placement.  STS from [facility] with PT in front; pt has a tendency to posteriorly weight shift when coming into a stand.    Side scooting utilizing a mini squat along the side of the mat with PT x5 in each direction requiring modA throughout.  Seated marching x10 on each leg with assistance provided at LLE  Seated knee extensions x10 on each leg with 3s hold with assistance provided at LLE  Scooting  back into WC from mat table with modA+1    Pt educated throughout session about proper posture and technique with exercises. Improved exercise technique, movement at target joints, use of target muscles after min to mod verbal, visual, tactile cues.   Patientdisplays good motivation throughout today's session.  He benefits from moderate verbal cueing for anterior weight shift when performing STS exercises.  He demonstrated progress today with all exercises, requiring less assistance to come to stand.  He performed multiple stands today from the [facility] to the parallel bars, a RW, and with the PT in front of him, requiring modA+1. Pt will benefit from continued skilled PT services in order to increase strength and functional mobilityin order to decrease caregiver burden, improve his ability to perform ADLs, and to improve his QOL.                          PT Short Term Goals - 12/04/18 1303      PT SHORT TERM GOAL #1   Title  Be independent with initial home exercise program for self-management of symptoms.    Baseline  HEP to be given at second session; advice to practice unsupported sitting at home (06/19/2018); 07/08/18: Performing at home, 6/29: needs assistance but is doing exercise program regularly; 08/22/18: adherent;    Time  2    Period  Weeks    Status  Achieved    Target Date  06/26/18        PT Long Term Goals - 12/04/18 1303      PT LONG TERM GOAL #1    Title  Patient will complete all bed mobility with min A to improve functional independence for getting in and out of bed and adjusting in bed.     Baseline  max A - total A reported by family (06/19/2018); 07/08/18: still requires maxA, dtr reports improvement in rolling since starting therapy; 6/23: min A to supervision in clinic; not doing at home right; 8/5: Pt's daughter states that his rolling has improved, but still has difficulty with all other bed mobility; 10/23/18: pts daughter states that she is doing about  35% of the work if he is in proper position, he does uses the bed rails to help roll;  using Harrel Lemon for coming to sit EOB; 10/12: pts daughter states that she is doing about 30% of the work if he is in proper position, he does uses the bed rails to help roll, but it is improving;  using Hoyer for coming to sit EOB, 10/29: min A for sit to sidelying, rolling supervision, able to transition sidelying to sit with HHA;    Time  12    Period  Weeks    Status  Achieved      PT LONG TERM GOAL #2   Title  Patient will complete sit <> stand transfer chair to chair with Min A and LRAD to improve functional independence for household and community mobility.     Baseline  max A +2 STS at W/C (06/12/2018); max A +2-3 STS or stand pivot transfer, able to complete more reps (06/19/2018); 07/08/18: not safe to attempt at this time; 09/11/18: requires maxA+1 or modA+2 for STSs and squat pivot transfer; 10/23/18: requires maxA+1 or modA+2 for STSs and squat pivot transfer; 10/12: requires maxA+1 or modA+2 for STSs and squat pivot transfer; 10/29: patient able to complete sit<>Stand transfer min A +1 with parallel bars and RW, although inconsistent, mod A for stand pivot;    Time  12    Period  Weeks    Status  Partially Met    Target Date  02/27/19      PT LONG TERM GOAL #3   Title  Patient will navigate power w/c with min A x 100 feet to improve mobility for household and short community distances.    Baseline   required total A (06/12/2018); able to roll 20 feet with min A (06/20/2018); 07/08/18: Pt can go approximately 10' without assist per family;  10/23/2018: pt's daughter states that his power WC mobility has become 100% better with the new hand control, slowed speed, and with practice; 10/12: pt's daughter reports that he can perform household WC mobility and limited community ambulation with minA    Time  12    Period  Weeks    Status  Achieved      PT LONG TERM GOAL #4   Title  Patient will complete car transfers W/C to family SUV with min A using LRAD to improve his abilty to participate in community activities.     Baseline  unable to complete car transfers at all (06/12/2018); unable to complete car transfers at all (06/19/2018).; 07/08/18: Unable to perform at this time; 09/11/18: unable to perform at this time; 10/23/2018: unable to complete car transfers at all; 10/12:  unable to complete car transfers at all, 10/29: unable    Time  12    Period  Weeks    Status  On-going    Target Date  02/27/19      PT LONG TERM GOAL #5   Title  Patient will ambulate at least 150 feet with mod I using LRAD to improve mobility for household and community distances.     Baseline  unable to take steps (06/12/2018); able to shift R leg forward and back with max A and RW at edge of plinth (06/19/2018); 07/08/18: unable to ambulate at this time. 09/11/18: unable to perform at this time; 10/23/2018: unable to perform at this time; 10/12: unable to perform at this time, 10/29: unable at this time    Time  12  Period  Weeks    Status  On-going    Target Date  02/27/19      Additional Long Term Goals   Additional Long Term Goals  Yes      PT LONG TERM GOAL #6   Title  Patient will be able to safety navigate up/down ramp with power chair, exhibiting good safety awareness, mod I to safely enter/exit his home.    Baseline  unable to complet stairs (06/12/2018); unable to complete stairs (06/19/2018); 07/08/18: unable to attempt at this  time; 09/11/18: unsafe to attempt at this time; 10/23/2018: unable to complet stairs (06/12/2018); unable to complete stairs (06/19/2018); 07/08/18: unable to attempt at this time; 09/11/18: unsafe to attempt at this time; 10/23/18: unsafe to attempt at this time, 10/29: requires superivison and occasional min A for safety    Time  12    Period  Weeks    Status  Revised    Target Date  02/27/19      PT LONG TERM GOAL #7   Title  Patient will increase functional reach test to >15 inches in sitting to exhibit improved sitting balance and positioning and reduce fall risk;    Baseline  07/30/18: 12 inches; 09/11/18: 10-12 inches; 10/23/18: 12-13 inch; 10/12: 14.5", 10/29: 15.5 inch    Time  4    Period  Weeks    Status  Achieved      PT LONG TERM GOAL #8   Title  Patient will increase LLE quad strength to 3+/5 to improve functional strength for standing and mobility;    Baseline  10/28: 2+/5    Time  12    Period  Weeks    Status  New    Target Date  02/27/19            Plan - 12/05/18 1501    Clinical Impression Statement  Patient displays good motivation throughout today's session.  He benefits from moderate verbal cueing for anterior weight shift when performing STS exercises.  He demonstrated progress today with all exercises, requiring less assistance to come to stand.  He performed multiple stands today from the [facility] to the parallel bars, a RW, and with the PT in front of him, requiring modA+1. Pt will benefit from continued skilled PT services in order to increase strength and functional mobility in order to decrease caregiver burden, improve his ability to perform ADLs, and to improve his QOL.    Personal Factors and Comorbidities  Age;Comorbidity 3+    Comorbidities  Relevant past medical history and comorbidities include long term steroid use for lupus, CKD, chronic osteomyelitis, cervical spine stenosis, BPH, APS, alcohol abuse, peripheral artery disease, depression, GERD, obstructive sleep  apnea, HTN, IBS, IgA deficiency, lumbar radiculopathy, RA, Stroke, toxic maculopathy in both eyes, systemic lupus erythematosus related syndrome, cardiac catheterization, carotid endarterectomy, cervical laminectomy, coronary angioplasty, Fusion C5-C7, knee arthroplasty, lumbar surgery.    Examination-Activity Limitations  Bed Mobility;Bend;Sit;Toileting;Stand;Stairs;Lift;Transfers;Squat;Locomotion Level;Carry;Dressing;Hygiene/Grooming;Continence    Examination-Participation Restrictions  Yard Work;Interpersonal Relationship;Community Activity    Stability/Clinical Decision Making  Evolving/Moderate complexity    Rehab Potential  Fair    PT Frequency  3x / week    PT Duration  12 weeks    PT Treatment/Interventions  ADLs/Self Care Home Management;Electrical Stimulation;Moist Heat;Cryotherapy;Gait training;Stair training;Functional mobility training;Therapeutic exercise;Balance training;Neuromuscular re-education;Cognitive remediation;Patient/family education;Orthotic Fit/Training;Wheelchair mobility training;Manual techniques;Passive range of motion;Energy conservation;Joint Manipulations    PT Next Visit Plan  Work on strength, sitting/standing balance, functional activities such as rolling, bed mobility, transfers;  caregiver training    PT Home Exercise Plan  handout provided via Beth during previous sessions    Consulted and Agree with Plan of Care  Patient;Family member/caregiver    Family Member Consulted  daughter [name]       Patient will benefit from skilled therapeutic intervention in order to improve the following deficits and impairments:  Abnormal gait, Decreased activity tolerance, Decreased cognition, Decreased endurance, Decreased knowledge of use of DME, Decreased range of motion, Decreased skin integrity, Decreased strength, Impaired perceived functional ability, Impaired sensation, Impaired UE functional use, Improper body mechanics, Pain, Cardiopulmonary status limiting activity,  Decreased balance, Decreased coordination, Decreased mobility, Difficulty walking, Impaired tone, Postural dysfunction  Visit Diagnosis: Muscle weakness (generalized)  Difficulty in walking, not elsewhere classified  Other lack of coordination     Problem List Patient Active Problem List   Diagnosis Date Noted  . Sepsis ([place]) 10/18/2018    This entire session was performed under direct supervision and direction of a licensed therapist/therapist assistant . I have personally read, edited and approve of the note as written.   Lutricia Horsfall, SPT Phillips Grout PT, DPT, GCS  Huprich,Jason 12/06/2018, 1:21 PM  [place] MAIN [facility] SERVICES [address] [place], [place], [address] Phone: [phone]   Fax:  [phone]  Name: Kenneth Summers MRN: [id number] Date of Birth: March 06, 1945

## 2018-12-05 NOTE — Therapy (Addendum)
[place] MAIN [facility] SERVICES [address] [place], [place], [address] Phone: [phone]   Fax:  [phone]  Occupational Therapy Treatment  Patient Details  Name: Kenneth Summers MRN: [id number] Date of Birth: 1945-02-13 No data recorded  Encounter Date: 12/05/2018  OT End of Session - 12/05/18 1446    Visit Number  72    Number of Visits  66    Date for OT Re-Evaluation  01/15/19    Authorization Type  Progress report period starting 10/30/2018    OT Start Time  1347    OT Stop Time  1425    OT Time Calculation (min)  38 min    Activity Tolerance  Patient tolerated treatment well    Behavior During Therapy  [facility] for tasks assessed/performed       Past Medical History:  Diagnosis Date  . Alcohol abuse   . APS (antiphospholipid syndrome) ([place])   . BPH (benign prostatic hyperplasia)   . CAD (coronary artery disease)   . Cellulitis   . Cervical spinal stenosis   . Chronic osteomyelitis ([place])   . CKD (chronic kidney disease)   . CKD (chronic kidney disease)   . Clostridium difficile diarrhea   . Depression   . Diplopia   . Fatigue   . GERD (gastroesophageal reflux disease)   . Hyperlipemia   . Hypertension   . Hypovitaminosis D   . IBS (irritable bowel syndrome)   . 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 Insomnia   . Left lumbosacral radiculopathy   . Moderate obstructive sleep apnea   . Osteomyelitis of foot ([place])   . PAD (peripheral artery disease) ([place])   . Pruritus   . RA (rheumatoid arthritis) ([place])   . Radiculopathy   . Restless legs syndrome   . RLS (restless legs syndrome)   . SLE (systemic lupus erythematosus related syndrome) ([place])   . Stroke ([place])   . Toxic maculopathy of both eyes     Past Surgical History:  Procedure Laterality Date  . CARDIAC CATHETERIZATION    . CAROTID ENDARTERECTOMY    . CERVICAL LAMINECTOMY    . CORONARY ANGIOPLASTY    . FRACTURE SURGERY    . fusion C5-6-7    . HERNIA REPAIR    . KNEE  ARTHROSCOPY    . TONSILLECTOMY      There were no vitals filed for this visit.  Subjective Assessment - 12/05/18 1445    Subjective   Pt. reports feeling well today.    Patient is accompanied by:  Family member    Pertinent History  Pt. is a 73 y.o. 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 Pt. PMHx includes: Multiple Falls, Lupus, DJD, Renal Abscess, CVA. Pt. resides with his wife. Pt.'s wife and daughter assist with ADLs. Pt. has caregivers in  for 2 hours a day, 6 days a week. Pt. received Rehab services in acute care, at SNF for STR, and [facility]. Pt. is retired from [employer] for [employer] and [employer].    Currently in Pain?  No/denies       OT TREATMENT    Selfcare:  Pt. Worked on holding and using a buttonhook with his left hand to button a shirt with medium to small sized buttons.Pt. worked on UE dressing tasks using hemi-dressing techniques. Pt. required increased cues for initially positioning the sweatshirt when preparing to donn a zip down sweatshirt, and pt. Initially placed  the jacket on backwards. Pt. requiredfewerstep-by step verbal, and tactile cues.Pt. Required min-mod assist to position the jacket around his back, at his trunk, as well as the for the position of the zipper. Pt. Worked on snapping snaps on a shirt.  Response to Treatment  Pt. tolerated the therapy session well, and required one rest break at the end of his requiring his w/c to be reclined to rest his back, and neck. Pt. required increased visual, and verbal cues to initiate donning the jacket over his LUE, with his RUE, as pt. initally attempts to put the jacket on backwards with the back of the jacket facing front. Pt. required assist to lean forward, and pull the jacket down in the back. Pt. was able to see the buttons, and snaps better with his glasses, however continues to require increased time, and cues to position the buttonhook through the button  hole first before loopin the button using it in his left hand. Pt. continues to work on improving these skills to reinforce carryover, and maximize independence.                     OT Education - 12/05/18 1446    Education Details  LUE functioning, Bruno, UE dressing    Person(s) Educated  Patient    Methods  Explanation;Demonstration    Comprehension  Returned demonstration;Tactile cues required;Verbalized understanding;Need further instruction          OT Long Term Goals - 10/28/18 1532      OT LONG TERM GOAL #1   Title  Pt. will increase left shoulder flexion AROM by 10 degrees to access cabinet/shelf.    Baseline  Pt. has difficulty reaching for items with the LUE secondary to decreased LUE ROM    Time  12    Period  Weeks    Status  On-going    Target Date  01/15/19      OT LONG TERM GOAL #2   Title  Pt. will donn a shirt with Supervision.    Baseline  Min    Time  12    Period  Weeks    Status  On-going    Target Date  01/15/19      OT LONG TERM GOAL #3   Title  Pt. will require ModA to perform LE dressing    Baseline  MaxA left, ModA with the right    Time  12    Period  Weeks    Status  On-going    Target Date  01/15/19      OT LONG TERM GOAL #4   Title  Pt. will improve left grip strength by 10# to be able to open a jar/container.    Baseline  Decreased grip since hospital readmission, pt. has difficulty opening containers    Time  12    Period  Weeks    Status  On-going    Target Date  01/15/19      OT LONG TERM GOAL #5   Title  Pt. will improve left hand [facility] skills to be able to assit with buttoning/zipping.    Baseline  Pt. has difficulty [facility] skills buttoning/zipping    Time  12    Period  Weeks    Status  On-going    Target Date  01/15/19      OT LONG TERM GOAL #6   Title  Pt. will demonstrate visual conmpensatory strategies during 100% of the time during ADLs  Baseline  Pt. requires cues for left sides awareness.    Time  12     Period  Weeks    Status  On-going    Target Date  01/15/19      OT LONG TERM GOAL #7   Title  Pt. will prepare a simple cold snack from w/c with supervision using cognitive compensatory strategies 100% of the time.    Baseline  Pt. is unable    Time  12    Period  Weeks    Target Date  11/27/18      OT LONG TERM GOAL #8   Title  Pt. will accurately identify potential safety hazard using good safety awareness, and judgement 100% for ADLs, and IADLs.    Baseline  Limited    Time  12    Period  Weeks    Status  On-going    Target Date  11/27/18      OT LONG TERM GOAL  #9   TITLE  Pt. will  navigate his w/c around obstacles with Supervision and 100% accuracy    Baseline  Pt. requires cuing for obstacle on the left    Time  12    Period  Weeks    Status  On-going    Target Date  11/27/18            Plan - 12/05/18 1448    Clinical Impression Statement  Pt. tolerated the therapy session well, and required one rest break at the end of his requiring his w/c to be reclined to rest his back, and neck. Pt. required increased visual, and verbal cues to initiate donning the jacket over his LUE, with his RUE, as pt. initally attempts to put the jacket on backwards with the back of the jacket facing front. Pt. required assist to lean forward, and pull the jacket down in the back. Pt. was able to see the buttons, and snaps better with his glasses, however continues to require increased time, and cues to position the buttonhook through the button hole first before loopin the button using it in his left hand. Pt. continues to work on improving these skills to reinforce carryover, and maximize independence.    OT Occupational Profile and History  Problem Focused Assessment - Including review of records relating to presenting problem    Occupational performance deficits (Please refer to evaluation for details):  ADL's    Body Structure / Function / Physical Skills  UE functional  use;Coordination;FMC;Dexterity;Strength;ROM    Cognitive Skills  Attention;Memory;Emotional;Problem Solve;Safety Awareness    Rehab Potential  Good    Clinical Decision Making  Several treatment options, min-mod task modification necessary    Comorbidities Affecting Occupational Performance:  Presence of comorbidities impacting occupational performance    Comorbidities impacting occupational performance description:  Phyical, cognitive, visual,  medical comorbidities    Modification or Assistance to Complete Evaluation   No modification of tasks or assist necessary to complete eval    OT Frequency  3x / week    OT Duration  12 weeks    OT Treatment/Interventions  Self-care/ADL training;DME and/or AE instruction;Therapeutic exercise;Therapeutic activities;Moist Heat;Cognitive remediation/compensation;Neuromuscular education;Visual/perceptual remediation/compensation;Coping strategies training;Patient/family education;Passive range of motion;Psychosocial skills training;Energy conservation;Functional Mobility Training    Consulted and Agree with Plan of Care  Patient    Family Member Consulted  Daughter [name]       Patient will benefit from skilled therapeutic intervention in order to improve the following deficits and impairments:   Body Structure /  Function / Physical Skills: UE functional use, Coordination, FMC, Dexterity, Strength, ROM Cognitive Skills: Attention, Memory, Emotional, Problem Solve, Safety Awareness     Visit Diagnosis: Muscle weakness (generalized)    Problem List Patient Active Problem List   Diagnosis Date Noted  . Sepsis ([place]) 10/18/2018    Harrel Carina, MS, OTR/L 12/05/2018, 5:42 PM  [place] MAIN [facility] SERVICES [address] [place], [place], [address] Phone: [phone]   Fax:  [phone]  Name: Kenneth Summers MRN: [id number] Date of Birth: Jul 25, 1945

## 2018-12-09 ENCOUNTER — Other Ambulatory Visit: Payer: Self-pay

## 2018-12-09 ENCOUNTER — Encounter: Payer: Self-pay | Admitting: Physical Therapy

## 2018-12-09 ENCOUNTER — Ambulatory Visit: Payer: Medicare Other | Attending: Family Medicine | Admitting: Physical Therapy

## 2018-12-09 DIAGNOSIS — R278 Other lack of coordination: Secondary | ICD-10-CM | POA: Insufficient documentation

## 2018-12-09 DIAGNOSIS — I69354 Hemiplegia and hemiparesis following cerebral infarction affecting left non-dominant side: Secondary | ICD-10-CM | POA: Insufficient documentation

## 2018-12-09 DIAGNOSIS — R41841 Cognitive communication deficit: Secondary | ICD-10-CM | POA: Insufficient documentation

## 2018-12-09 DIAGNOSIS — R296 Repeated falls: Secondary | ICD-10-CM | POA: Diagnosis present

## 2018-12-09 DIAGNOSIS — M6281 Muscle weakness (generalized): Secondary | ICD-10-CM | POA: Diagnosis not present

## 2018-12-09 DIAGNOSIS — H547 Unspecified visual loss: Secondary | ICD-10-CM | POA: Insufficient documentation

## 2018-12-09 DIAGNOSIS — R262 Difficulty in walking, not elsewhere classified: Secondary | ICD-10-CM | POA: Insufficient documentation

## 2018-12-09 NOTE — Therapy (Signed)
[place] MAIN [facility] SERVICES [address] [place], [place], [address] Phone: [phone]   Fax:  [phone]  Physical Therapy Treatment  Patient Details  Name: Kenneth Summers MRN: [id number] Date of Birth: 1946/01/06 Referring Provider (PT): Dr. [name], Guy Begin   Encounter Date: 12/09/2018  PT End of Session - 12/10/18 1502    Visit Number  58    Number of Visits  108    Date for PT Re-Evaluation  02/27/19    Authorization Type  Medicare reporting period starting 09/11/18    Authorization Time Period  goals last assessed 12/04/18    PT Start Time  1302    PT Stop Time  1345    PT Time Calculation (min)  43 min    Equipment Utilized During Treatment  Gait belt    Activity Tolerance  Patient tolerated treatment well;Patient limited by fatigue    Behavior During Therapy  [facility] for tasks assessed/performed       Past Medical History:  Diagnosis Date  . Alcohol abuse   . APS (antiphospholipid syndrome) ([place])   . BPH (benign prostatic hyperplasia)   . CAD (coronary artery disease)   . Cellulitis   . Cervical spinal stenosis   . Chronic osteomyelitis ([place])   . CKD (chronic kidney disease)   . CKD (chronic kidney disease)   . Clostridium difficile diarrhea   . Depression   . Diplopia   . Fatigue   . GERD (gastroesophageal reflux disease)   . Hyperlipemia   . Hypertension   . Hypovitaminosis D   . IBS (irritable bowel syndrome)   . IgA deficiency ([place])   . Insomnia   . Left lumbosacral radiculopathy   . Moderate obstructive sleep apnea   . Osteomyelitis of foot ([place])   . PAD (peripheral artery disease) ([place])   . Pruritus   . RA (rheumatoid arthritis) ([place])   . Radiculopathy   . Restless legs syndrome   . RLS (restless legs syndrome)   . SLE (systemic lupus erythematosus related syndrome) ([place])   . Stroke ([place])   . Toxic maculopathy of both eyes     Past Surgical History:  Procedure Laterality Date  . CARDIAC  CATHETERIZATION    . CAROTID ENDARTERECTOMY    . CERVICAL LAMINECTOMY    . CORONARY ANGIOPLASTY    . FRACTURE SURGERY    . fusion C5-6-7    . HERNIA REPAIR    . KNEE ARTHROSCOPY    . TONSILLECTOMY      There were no vitals filed for this visit.  Subjective Assessment - 12/09/18 1309    Subjective  Patient reports doing okay;    Patient is accompained by:  Family member    Pertinent History  Patient is a 73 y.o. male who presents to outpatient physical therapy with a referral for medical diagnosis of CVA. This patient's chief complaints consist of left hemiplegia and overall deconditioning leading to the following functional deficits: dependent for ADLs, IADLs, unable to transfer to car, dependent transfers at home with hoyer lift, unable to walk or stand without significant assistance, difficulty with W/C navigation..    Limitations  Sitting;Lifting;Standing;House hold activities;Writing;Walking;Other (comment)    Diagnostic tests  Brain MRI 11/01/2017: IMPRESSION: 1. Acute right ACA territory infarct as demonstrated on prior CT imaging. No intracranial hemorrhage or significant mass effect. 2. 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    Currently in Pain?  No/denies  Multiple Pain Sites  No         TREATMENT: Seated in power chair: Leg press against pball x10 reps each LE 5 sec hold; Required min A for positioning and mod VCs to increase push through LE especially on LLE; Patient able to exhibit good quad activation;   Seated with 2# ankle weight: LAQ x15 bilaterally; Hip flexion march x15 bilaterally Requires mod VCs and TCs for better ROM on LLE, he is unable to achieve full ROM on LLE but with increased repetition was able to exhibit better AROM;  Sitting in power chair with pball under legs: quad set 5 sec hold x10 reps each LE with good quad activation on LLE. Instructed patient and caregiver to increase quad sets as part of HEP. Patient  and caregiver verbalized understanding;   Following exercise:  Instructed patient in transfers:  Sit<>Stand from power chair with RW: x5 reps with min A +1 with intermittent therapist assist to block left knee; patient able to exhibit good forward trunk lean but does require mod VCs for proper positioning during mid transfer; Upon standing, patient able to exhibit improved hip/knee extension being able to progress to CGA for balance; Patient able to stand for 10-30 sec each repetition with good control; He was able to reach back for arm rest of chair to control sit down with good positioning and safety awareness;   Patient tolerated session well. He reports less knee discomfort upon standing; He does report increased fatigue at end of session. Patient responds well to cues with better positioning and muscle activation;                      PT Education - 12/10/18 1502    Education Details  LE strengthening, transfers, positioning, HEP    Person(s) Educated  Patient;Child(ren)    Methods  Explanation;Demonstration;Verbal cues    Comprehension  Verbalized understanding;Returned demonstration;Verbal cues required;Need further instruction       PT Short Term Goals - 12/04/18 1303      PT SHORT TERM GOAL #1   Title  Be independent with initial home exercise program for self-management of symptoms.    Baseline  HEP to be given at second session; advice to practice unsupported sitting at home (06/19/2018); 07/08/18: Performing at home, 6/29: needs assistance but is doing exercise program regularly; 08/22/18: adherent;    Time  2    Period  Weeks    Status  Achieved    Target Date  06/26/18        PT Long Term Goals - 12/04/18 1303      PT LONG TERM GOAL #1   Title  Patient will complete all bed mobility with min A to improve functional independence for getting in and out of bed and adjusting in bed.     Baseline  max A - total A reported by family (06/19/2018); 07/08/18: still  requires maxA, dtr reports improvement in rolling since starting therapy; 6/23: min A to supervision in clinic; not doing at home right; 8/5: Pt's daughter states that his rolling has improved, but still has difficulty with all other bed mobility; 10/23/18: pts daughter states that she is doing about 35% of the work if he is in proper position, he does uses the bed rails to help roll;  using Harrel Lemon for coming to sit EOB; 10/12: pts daughter states that she is doing about 30% of the work if he is in proper position, he does uses the  bed rails to help roll, but it is improving;  using Hoyer for coming to sit EOB, 10/29: min A for sit to sidelying, rolling supervision, able to transition sidelying to sit with HHA;    Time  12    Period  Weeks    Status  Achieved      PT LONG TERM GOAL #2   Title  Patient will complete sit <> stand transfer chair to chair with Min A and LRAD to improve functional independence for household and community mobility.     Baseline  max A +2 STS at W/C (06/12/2018); max A +2-3 STS or stand pivot transfer, able to complete more reps (06/19/2018); 07/08/18: not safe to attempt at this time; 09/11/18: requires maxA+1 or modA+2 for STSs and squat pivot transfer; 10/23/18: requires maxA+1 or modA+2 for STSs and squat pivot transfer; 10/12: requires maxA+1 or modA+2 for STSs and squat pivot transfer; 10/29: patient able to complete sit<>Stand transfer min A +1 with parallel bars and RW, although inconsistent, mod A for stand pivot;    Time  12    Period  Weeks    Status  Partially Met    Target Date  02/27/19      PT LONG TERM GOAL #3   Title  Patient will navigate power w/c with min A x 100 feet to improve mobility for household and short community distances.    Baseline  required total A (06/12/2018); able to roll 20 feet with min A (06/20/2018); 07/08/18: Pt can go approximately 10' without assist per family;  10/23/2018: pt's daughter states that his power WC mobility has become 100% better with  the new hand control, slowed speed, and with practice; 10/12: pt's daughter reports that he can perform household WC mobility and limited community ambulation with minA    Time  12    Period  Weeks    Status  Achieved      PT LONG TERM GOAL #4   Title  Patient will complete car transfers W/C to family SUV with min A using LRAD to improve his abilty to participate in community activities.     Baseline  unable to complete car transfers at all (06/12/2018); unable to complete car transfers at all (06/19/2018).; 07/08/18: Unable to perform at this time; 09/11/18: unable to perform at this time; 10/23/2018: unable to complete car transfers at all; 10/12:  unable to complete car transfers at all, 10/29: unable    Time  12    Period  Weeks    Status  On-going    Target Date  02/27/19      PT LONG TERM GOAL #5   Title  Patient will ambulate at least 150 feet with mod I using LRAD to improve mobility for household and community distances.     Baseline  unable to take steps (06/12/2018); able to shift R leg forward and back with max A and RW at edge of plinth (06/19/2018); 07/08/18: unable to ambulate at this time. 09/11/18: unable to perform at this time; 10/23/2018: unable to perform at this time; 10/12: unable to perform at this time, 10/29: unable at this time    Time  12    Period  Weeks    Status  On-going    Target Date  02/27/19      Additional Long Term Goals   Additional Long Term Goals  Yes      PT LONG TERM GOAL #6   Title  Patient will be  able to safety navigate up/down ramp with power chair, exhibiting good safety awareness, mod I to safely enter/exit his home.    Baseline  unable to complet stairs (06/12/2018); unable to complete stairs (06/19/2018); 07/08/18: unable to attempt at this time; 09/11/18: unsafe to attempt at this time; 10/23/2018: unable to complet stairs (06/12/2018); unable to complete stairs (06/19/2018); 07/08/18: unable to attempt at this time; 09/11/18: unsafe to attempt at this time; 10/23/18:  unsafe to attempt at this time, 10/29: requires superivison and occasional min A for safety    Time  12    Period  Weeks    Status  Revised    Target Date  02/27/19      PT LONG TERM GOAL #7   Title  Patient will increase functional reach test to >15 inches in sitting to exhibit improved sitting balance and positioning and reduce fall risk;    Baseline  07/30/18: 12 inches; 09/11/18: 10-12 inches; 10/23/18: 12-13 inch; 10/12: 14.5", 10/29: 15.5 inch    Time  4    Period  Weeks    Status  Achieved      PT LONG TERM GOAL #8   Title  Patient will increase LLE quad strength to 3+/5 to improve functional strength for standing and mobility;    Baseline  10/28: 2+/5    Time  12    Period  Weeks    Status  New    Target Date  02/27/19            Plan - 12/10/18 1502    Clinical Impression Statement  Patient motivated and participated well within session. instructed patient in advanced LE strengthening. He was able to exhibit better quad activation this session with better ROM.Following exercise, patient able to stand with min A +1 with RW with improved standing positioning and weight shift. Pt reports less knee discomfort standing this session. Patient would benefit from additional skilled PT Intervention to improve strength, balance and gait safety;    Personal Factors and Comorbidities  Age;Comorbidity 3+    Comorbidities  Relevant past medical history and comorbidities include long term steroid use for lupus, CKD, chronic osteomyelitis, cervical spine stenosis, BPH, APS, alcohol abuse, peripheral artery disease, depression, GERD, obstructive sleep apnea, HTN, IBS, IgA deficiency, lumbar radiculopathy, RA, Stroke, toxic maculopathy in both eyes, systemic lupus erythematosus related syndrome, cardiac catheterization, carotid endarterectomy, cervical laminectomy, coronary angioplasty, Fusion C5-C7, knee arthroplasty, lumbar surgery.    Examination-Activity Limitations  Bed  Mobility;Bend;Sit;Toileting;Stand;Stairs;Lift;Transfers;Squat;Locomotion Level;Carry;Dressing;Hygiene/Grooming;Continence    Examination-Participation Restrictions  Yard Work;Interpersonal Relationship;Community Activity    Stability/Clinical Decision Making  Evolving/Moderate complexity    Rehab Potential  Fair    PT Frequency  3x / week    PT Duration  12 weeks    PT Treatment/Interventions  ADLs/Self Care Home Management;Electrical Stimulation;Moist Heat;Cryotherapy;Gait training;Stair training;Functional mobility training;Therapeutic exercise;Balance training;Neuromuscular re-education;Cognitive remediation;Patient/family education;Orthotic Fit/Training;Wheelchair mobility training;Manual techniques;Passive range of motion;Energy conservation;Joint Manipulations    PT Next Visit Plan  Work on strength, sitting/standing balance, functional activities such as rolling, bed mobility, transfers; caregiver training    PT Home Exercise Plan  handout provided via Beth during previous sessions    Consulted and Agree with Plan of Care  Patient;Family member/caregiver    Family Member Consulted  daughter [name]       Patient will benefit from skilled therapeutic intervention in order to improve the following deficits and impairments:  Abnormal gait, Decreased activity tolerance, Decreased cognition, Decreased endurance, Decreased knowledge of use of DME, Decreased  range of motion, Decreased skin integrity, Decreased strength, Impaired perceived functional ability, Impaired sensation, Impaired UE functional use, Improper body mechanics, Pain, Cardiopulmonary status limiting activity, Decreased balance, Decreased coordination, Decreased mobility, Difficulty walking, Impaired tone, Postural dysfunction  Visit Diagnosis: Muscle weakness (generalized)  Difficulty in walking, not elsewhere classified  Other lack of coordination  Repeated falls     Problem List Patient Active Problem List   Diagnosis  Date Noted  . Sepsis ([place]) 10/18/2018    Glorine Hanratty PT, DPT 12/10/2018, 3:05 PM  [place] MAIN [facility] SERVICES [address] [place], [place], [address] Phone: [phone]   Fax:  [phone]  Name: Kenneth Summers MRN: [id number] Date of Birth: 07-01-1945

## 2018-12-11 ENCOUNTER — Other Ambulatory Visit: Payer: Self-pay

## 2018-12-11 ENCOUNTER — Ambulatory Visit: Payer: Medicare Other | Admitting: Occupational Therapy

## 2018-12-11 ENCOUNTER — Ambulatory Visit: Payer: Medicare Other | Admitting: Physical Therapy

## 2018-12-11 ENCOUNTER — Encounter: Payer: Self-pay | Admitting: Occupational Therapy

## 2018-12-11 ENCOUNTER — Encounter: Payer: Self-pay | Admitting: Physical Therapy

## 2018-12-11 DIAGNOSIS — M6281 Muscle weakness (generalized): Secondary | ICD-10-CM

## 2018-12-11 DIAGNOSIS — R278 Other lack of coordination: Secondary | ICD-10-CM

## 2018-12-11 DIAGNOSIS — R296 Repeated falls: Secondary | ICD-10-CM

## 2018-12-11 DIAGNOSIS — R262 Difficulty in walking, not elsewhere classified: Secondary | ICD-10-CM

## 2018-12-11 NOTE — Therapy (Signed)
[place] MAIN [facility] SERVICES [address] [place], [place], [address] Phone: [phone]   Fax:  [phone]  Physical Therapy Treatment  Patient Details  Name: Kenneth Summers MRN: [id number] Date of Birth: [birth year] Referring Provider (PT): Dr. [name], Guy Begin   Encounter Date: 12/11/2018  PT End of Session - 12/11/18 1359    Visit Number  56    Number of Visits  108    Date for PT Re-Evaluation  02/27/19    Authorization Type  Medicare reporting period starting 09/11/18    Authorization Time Period  goals last assessed 12/04/18    PT Start Time  1302    PT Stop Time  1345    PT Time Calculation (min)  43 min    Equipment Utilized During Treatment  Gait belt    Activity Tolerance  Patient tolerated treatment well;Patient limited by fatigue    Behavior During Therapy  [facility] for tasks assessed/performed       Past Medical History:  Diagnosis Date  . Alcohol abuse   . APS (antiphospholipid syndrome) ([place])   . BPH (benign prostatic hyperplasia)   . CAD (coronary artery disease)   . Cellulitis   . Cervical spinal stenosis   . Chronic osteomyelitis ([place])   . CKD (chronic kidney disease)   . CKD (chronic kidney disease)   . Clostridium difficile diarrhea   . Depression   . Diplopia   . Fatigue   . GERD (gastroesophageal reflux disease)   . Hyperlipemia   . Hypertension   . Hypovitaminosis D   . IBS (irritable bowel syndrome)   . 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 Insomnia   . Left lumbosacral radiculopathy   . Moderate obstructive sleep apnea   . Osteomyelitis of foot ([place])   . PAD (peripheral artery disease) ([place])   . Pruritus   . RA (rheumatoid arthritis) ([place])   . Radiculopathy   . Restless legs syndrome   . RLS (restless legs syndrome)   . SLE (systemic lupus erythematosus related syndrome) ([place])   . Stroke ([place])   . Toxic maculopathy of both eyes     Past Surgical History:  Procedure Laterality Date  . CARDIAC  CATHETERIZATION    . CAROTID ENDARTERECTOMY    . CERVICAL LAMINECTOMY    . CORONARY ANGIOPLASTY    . FRACTURE SURGERY    . fusion C5-6-7    . HERNIA REPAIR    . KNEE ARTHROSCOPY    . TONSILLECTOMY      There were no vitals filed for this visit.  Subjective Assessment - 12/11/18 1358    Subjective  Patient reports doing well; Reports working on exercises at home with good results. He reports sitting edge of power chair and shaving his face with good balance/tolerance;    Patient is accompained by:  Family member    Pertinent History  Patient is a 73 y.o. male who presents to outpatient physical therapy with a referral for medical diagnosis of CVA. This patient's chief complaints consist of left hemiplegia and overall deconditioning leading to the following functional deficits: dependent for ADLs, IADLs, unable to transfer to car, dependent transfers at home with hoyer lift, unable to walk or stand without significant assistance, difficulty with W/C navigation..    Limitations  Sitting;Lifting;Standing;House hold activities;Writing;Walking;Other (comment)    Diagnostic tests  Brain MRI 11/01/2017: IMPRESSION: 1. Acute right ACA territory infarct as demonstrated on prior CT imaging. No intracranial hemorrhage or significant mass  effect. 2. 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    Currently in Pain?  No/denies    Multiple Pain Sites  No           TREATMENT: Prior to therapeutic activity: Seated in power chair: Leg press against pball 5 sec hold x15 reps each LE; Required min A for positioning and mod VCs to increase push through LE especially on LLE; Patient able to exhibit good quad activation;   Seated with 2# ankle weight: LAQ x10 bilaterally; Hip flexion march x15 bilaterally Reclined in power chair, SAQ 2# x15 each LE Reclined in power chair, LLE hamstring stretch with ankle DF 30 sec hold x2 reps for better flexibility in LLE;  Requires  mod VCs and TCs for better ROM on LLE, he is unable to achieve full ROM on LLE but with increased repetition was able to exhibit better AROM;   Following exercise:  Instructed patient in transfers:  Sit<>Stand from power chair with RW: x5 reps with min A +1 with intermittent therapist assist to block left knee; patient able to exhibit good forward trunk lean but does require mod VCs for proper positioning during mid transfer; Upon standing, patient able to exhibit improved hip/knee extension being able to progress to CGA/supervision for balance; He was able to stand for last repetition for 2 sec supervision with better weight shift to midline. Patient able to stand for 10-30 sec each repetition; He required min VCs to reach back for arm rest of chair to control sit down with good positioning and safety awareness;   Patient tolerated session well. He reports less knee discomfort upon standing; He does report increased fatigue at end of session. Patient responds well to cues with better positioning and muscle activation;                        PT Education - 12/11/18 1359    Education Details  LE strengthening, transfers, positioning, HEP    Person(s) Educated  Patient;Child(ren)    Methods  Explanation;Demonstration;Verbal cues    Comprehension  Verbalized understanding;Returned demonstration;Verbal cues required;Need further instruction       PT Short Term Goals - 12/04/18 1303      PT SHORT TERM GOAL #1   Title  Be independent with initial home exercise program for self-management of symptoms.    Baseline  HEP to be given at second session; advice to practice unsupported sitting at home (06/19/2018); 07/08/18: Performing at home, 6/29: needs assistance but is doing exercise program regularly; 08/22/18: adherent;    Time  2    Period  Weeks    Status  Achieved    Target Date  06/26/18        PT Long Term Goals - 12/04/18 1303      PT LONG TERM GOAL #1   Title   Patient will complete all bed mobility with min A to improve functional independence for getting in and out of bed and adjusting in bed.     Baseline  max A - total A reported by family (06/19/2018); 07/08/18: still requires maxA, dtr reports improvement in rolling since starting therapy; 6/23: min A to supervision in clinic; not doing at home right; 8/5: Pt's daughter states that his rolling has improved, but still has difficulty with all other bed mobility; 10/23/18: pts daughter states that she is doing about 35% of the work if he is in proper position, he does uses  the bed rails to help roll;  using Harrel Lemon for coming to sit EOB; 10/12: pts daughter states that she is doing about 30% of the work if he is in proper position, he does uses the bed rails to help roll, but it is improving;  using Hoyer for coming to sit EOB, 10/29: min A for sit to sidelying, rolling supervision, able to transition sidelying to sit with HHA;    Time  12    Period  Weeks    Status  Achieved      PT LONG TERM GOAL #2   Title  Patient will complete sit <> stand transfer chair to chair with Min A and LRAD to improve functional independence for household and community mobility.     Baseline  max A +2 STS at W/C (06/12/2018); max A +2-3 STS or stand pivot transfer, able to complete more reps (06/19/2018); 07/08/18: not safe to attempt at this time; 09/11/18: requires maxA+1 or modA+2 for STSs and squat pivot transfer; 10/23/18: requires maxA+1 or modA+2 for STSs and squat pivot transfer; 10/12: requires maxA+1 or modA+2 for STSs and squat pivot transfer; 10/29: patient able to complete sit<>Stand transfer min A +1 with parallel bars and RW, although inconsistent, mod A for stand pivot;    Time  12    Period  Weeks    Status  Partially Met    Target Date  02/27/19      PT LONG TERM GOAL #3   Title  Patient will navigate power w/c with min A x 100 feet to improve mobility for household and short community distances.    Baseline  required  total A (06/12/2018); able to roll 20 feet with min A (06/20/2018); 07/08/18: Pt can go approximately 10' without assist per family;  10/23/2018: pt's daughter states that his power WC mobility has become 100% better with the new hand control, slowed speed, and with practice; 10/12: pt's daughter reports that he can perform household WC mobility and limited community ambulation with minA    Time  12    Period  Weeks    Status  Achieved      PT LONG TERM GOAL #4   Title  Patient will complete car transfers W/C to family SUV with min A using LRAD to improve his abilty to participate in community activities.     Baseline  unable to complete car transfers at all (06/12/2018); unable to complete car transfers at all (06/19/2018).; 07/08/18: Unable to perform at this time; 09/11/18: unable to perform at this time; 10/23/2018: unable to complete car transfers at all; 10/12:  unable to complete car transfers at all, 10/29: unable    Time  12    Period  Weeks    Status  On-going    Target Date  02/27/19      PT LONG TERM GOAL #5   Title  Patient will ambulate at least 150 feet with mod I using LRAD to improve mobility for household and community distances.     Baseline  unable to take steps (06/12/2018); able to shift R leg forward and back with max A and RW at edge of plinth (06/19/2018); 07/08/18: unable to ambulate at this time. 09/11/18: unable to perform at this time; 10/23/2018: unable to perform at this time; 10/12: unable to perform at this time, 10/29: unable at this time    Time  12    Period  Weeks    Status  On-going    Target  Date  02/27/19      Additional Long Term Goals   Additional Long Term Goals  Yes      PT LONG TERM GOAL #6   Title  Patient will be able to safety navigate up/down ramp with power chair, exhibiting good safety awareness, mod I to safely enter/exit his home.    Baseline  unable to complet stairs (06/12/2018); unable to complete stairs (06/19/2018); 07/08/18: unable to attempt at this time;  09/11/18: unsafe to attempt at this time; 10/23/2018: unable to complet stairs (06/12/2018); unable to complete stairs (06/19/2018); 07/08/18: unable to attempt at this time; 09/11/18: unsafe to attempt at this time; 10/23/18: unsafe to attempt at this time, 10/29: requires superivison and occasional min A for safety    Time  12    Period  Weeks    Status  Revised    Target Date  02/27/19      PT LONG TERM GOAL #7   Title  Patient will increase functional reach test to >15 inches in sitting to exhibit improved sitting balance and positioning and reduce fall risk;    Baseline  07/30/18: 12 inches; 09/11/18: 10-12 inches; 10/23/18: 12-13 inch; 10/12: 14.5", 10/29: 15.5 inch    Time  4    Period  Weeks    Status  Achieved      PT LONG TERM GOAL #8   Title  Patient will increase LLE quad strength to 3+/5 to improve functional strength for standing and mobility;    Baseline  10/28: 2+/5    Time  12    Period  Weeks    Status  New    Target Date  02/27/19            Plan - 12/11/18 1400    Clinical Impression Statement  Patient motivated and participated well within session. He is progressing with LE strength with improved AROM on each side. Patient does require constant verbal/visual cues for correct exercise technique and positioning. He was able to exhibit improved muscle activation pushing harder through physioball this sesion. Patient able to exhibit better transfer ability with improved initial forward lean. upon standing, he does exhibit increased posterior loss of balance requiring min A for re-orientation to midline. Patient required mod VCs and tactile cues to weight shift forward for better midline and standing balance. He would benefit from additional skilled PT intervention to improve strength, balance and mobility;    Personal Factors and Comorbidities  Age;Comorbidity 3+    Comorbidities  Relevant past medical history and comorbidities include long term steroid use for lupus, CKD, chronic  osteomyelitis, cervical spine stenosis, BPH, APS, alcohol abuse, peripheral artery disease, depression, GERD, obstructive sleep apnea, HTN, IBS, IgA deficiency, lumbar radiculopathy, RA, Stroke, toxic maculopathy in both eyes, systemic lupus erythematosus related syndrome, cardiac catheterization, carotid endarterectomy, cervical laminectomy, coronary angioplasty, Fusion C5-C7, knee arthroplasty, lumbar surgery.    Examination-Activity Limitations  Bed Mobility;Bend;Sit;Toileting;Stand;Stairs;Lift;Transfers;Squat;Locomotion Level;Carry;Dressing;Hygiene/Grooming;Continence    Examination-Participation Restrictions  Yard Work;Interpersonal Relationship;Community Activity    Stability/Clinical Decision Making  Evolving/Moderate complexity    Rehab Potential  Fair    PT Frequency  3x / week    PT Duration  12 weeks    PT Treatment/Interventions  ADLs/Self Care Home Management;Electrical Stimulation;Moist Heat;Cryotherapy;Gait training;Stair training;Functional mobility training;Therapeutic exercise;Balance training;Neuromuscular re-education;Cognitive remediation;Patient/family education;Orthotic Fit/Training;Wheelchair mobility training;Manual techniques;Passive range of motion;Energy conservation;Joint Manipulations    PT Next Visit Plan  Work on strength, sitting/standing balance, functional activities such as rolling, bed mobility, transfers; caregiver training  PT Home Exercise Plan  handout provided via Beth during previous sessions    Consulted and Agree with Plan of Care  Patient;Family member/caregiver    Family Member Consulted  daughter [name]       Patient will benefit from skilled therapeutic intervention in order to improve the following deficits and impairments:  Abnormal gait, Decreased activity tolerance, Decreased cognition, Decreased endurance, Decreased knowledge of use of DME, Decreased range of motion, Decreased skin integrity, Decreased strength, Impaired perceived functional  ability, Impaired sensation, Impaired UE functional use, Improper body mechanics, Pain, Cardiopulmonary status limiting activity, Decreased balance, Decreased coordination, Decreased mobility, Difficulty walking, Impaired tone, Postural dysfunction  Visit Diagnosis: Muscle weakness (generalized)  Difficulty in walking, not elsewhere classified  Other lack of coordination  Repeated falls     Problem List Patient Active Problem List   Diagnosis Date Noted  . Sepsis ([place]) 10/18/2018    Dawnmarie Breon PT, DPT 12/11/2018, 2:02 PM  [place] MAIN [facility] SERVICES [address] [place], [place], [address] Phone: [phone]   Fax:  [phone]  Name: LADISLAV CASELLI MRN: [id number] Date of Birth: 12/30/1945

## 2018-12-11 NOTE — Therapy (Addendum)
[place] MAIN [facility] SERVICES [address] [place], [place], [address] Phone: [phone]   Fax:  [phone]  Occupational Therapy Treatment  Patient Details  Name: Kenneth Summers MRN: [id number] Date of Birth: Jan 17, 1946 No data recorded  Encounter Date: 12/11/2018  OT End of Session - 12/11/18 1808    Visit Number  65    Number of Visits  71    Date for OT Re-Evaluation  01/15/19    Authorization Type  Progress report period starting 10/30/2018    OT Start Time  1350    OT Stop Time  1430    OT Time Calculation (min)  40 min    Activity Tolerance  Patient tolerated treatment well    Behavior During Therapy  [facility] for tasks assessed/performed       Past Medical History:  Diagnosis Date  . Alcohol abuse   . APS (antiphospholipid syndrome) ([place])   . BPH (benign prostatic hyperplasia)   . CAD (coronary artery disease)   . Cellulitis   . Cervical spinal stenosis   . Chronic osteomyelitis ([place])   . CKD (chronic kidney disease)   . CKD (chronic kidney disease)   . Clostridium difficile diarrhea   . Depression   . Diplopia   . Fatigue   . GERD (gastroesophageal reflux disease)   . Hyperlipemia   . Hypertension   . Hypovitaminosis D   . IBS (irritable bowel syndrome)   . 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 Insomnia   . Left lumbosacral radiculopathy   . Moderate obstructive sleep apnea   . Osteomyelitis of foot ([place])   . PAD (peripheral artery disease) ([place])   . Pruritus   . RA (rheumatoid arthritis) ([place])   . Radiculopathy   . Restless legs syndrome   . RLS (restless legs syndrome)   . SLE (systemic lupus erythematosus related syndrome) ([place])   . Stroke ([place])   . Toxic maculopathy of both eyes     Past Surgical History:  Procedure Laterality Date  . CARDIAC CATHETERIZATION    . CAROTID ENDARTERECTOMY    . CERVICAL LAMINECTOMY    . CORONARY ANGIOPLASTY    . FRACTURE SURGERY    . fusion C5-6-7    . HERNIA REPAIR    . KNEE  ARTHROSCOPY    . TONSILLECTOMY      There were no vitals filed for this visit.  Subjective Assessment - 12/11/18 1807    Subjective   Pt. reports sinus issues with a runny nose.    Patient is accompanied by:  Family member    Pertinent History  Pt. is a 73 y.o. 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 Pt. PMHx includes: Multiple Falls, Lupus, DJD, Renal Abscess, CVA. Pt. resides with his wife. Pt.'s wife and daughter assist with ADLs. Pt. has caregivers in  for 2 hours a day, 6 days a week. Pt. received Rehab services in acute care, at SNF for STR, and [place] services. Pt. is retired from [employer] for [employer] and [employer].    Currently in Pain?  No/denies      OT TREATMENT    Neuro muscular re-education:  Pt. worked on using his left hand for grasping, and manipulating 1/2", 3/4" and 1" washers from a magnetic dish using point grasp pattern. Pt. worked on reaching up, stabilizing, and sustaining shoulder elevation while placing the washer over a small precise target on vertical dowels positioned  at various angles.  Pt. Worked on reaching to the left, and reps on forward trunk flexion to assist with extending the reach further.  Selfcare:  Pt. worked on UE dressing tasks using hemi-dressing techniques. Pt. required increased cues for initially positioning the sweatshirt when preparing to donn a zip down sweatshirt. Pt. Required cues to thread the LUE with the right, assist to bring the jacket around his back, and reposition the jacket to prepare for zipping.   Response to Treatment  Pt. continues to tolerate the session well. Pt. was distracted at time by sinus issues. Pt. continues to make progresss reporting that he shaved at home independently using an Copy. Pt. continues to require verbal cues, and min-mod assist for donning his jacket. Pt. continues to work on improving LUE functioning in order to improve, and increase engagement  during ADLs, and IADL tasks                    OT Education - 12/11/18 1808    Education Details  LUE functioning, [place], UE dressing    Person(s) Educated  Patient    Methods  Explanation;Demonstration    Comprehension  Returned demonstration;Tactile cues required;Verbalized understanding;Need further instruction          OT Long Term Goals - 10/28/18 1532      OT LONG TERM GOAL #1   Title  Pt. will increase left shoulder flexion AROM by 10 degrees to access cabinet/shelf.    Baseline  Pt. has difficulty reaching for items with the LUE secondary to decreased LUE ROM    Time  12    Period  Weeks    Status  On-going    Target Date  01/15/19      OT LONG TERM GOAL #2   Title  Pt. will donn a shirt with Supervision.    Baseline  Min    Time  12    Period  Weeks    Status  On-going    Target Date  01/15/19      OT LONG TERM GOAL #3   Title  Pt. will require ModA to perform LE dressing    Baseline  MaxA left, ModA with the right    Time  12    Period  Weeks    Status  On-going    Target Date  01/15/19      OT LONG TERM GOAL #4   Title  Pt. will improve left grip strength by 10# to be able to open a jar/container.    Baseline  Decreased grip since hospital readmission, pt. has difficulty opening containers    Time  12    Period  Weeks    Status  On-going    Target Date  01/15/19      OT LONG TERM GOAL #5   Title  Pt. will improve left hand [facility] skills to be able to assit with buttoning/zipping.    Baseline  Pt. has difficulty [facility] skills buttoning/zipping    Time  12    Period  Weeks    Status  On-going    Target Date  01/15/19      OT LONG TERM GOAL #6   Title  Pt. will demonstrate visual conmpensatory strategies during 100% of the time during ADLs    Baseline  Pt. requires cues for left sides awareness.    Time  12    Period  Weeks    Status  On-going  Target Date  01/15/19      OT LONG TERM GOAL #7   Title  Pt. will prepare a simple cold  snack from w/c with supervision using cognitive compensatory strategies 100% of the time.    Baseline  Pt. is unable    Time  12    Period  Weeks    Target Date  11/27/18      OT LONG TERM GOAL #8   Title  Pt. will accurately identify potential safety hazard using good safety awareness, and judgement 100% for ADLs, and IADLs.    Baseline  Limited    Time  12    Period  Weeks    Status  On-going    Target Date  11/27/18      OT LONG TERM GOAL  #9   TITLE  Pt. will  navigate his w/c around obstacles with Supervision and 100% accuracy    Baseline  Pt. requires cuing for obstacle on the left    Time  12    Period  Weeks    Status  On-going    Target Date  11/27/18            Plan - 12/11/18 1808    Clinical Impression Statement Pt. continues to tolerate the session well. Pt. was distracted at time by sinus issues. Pt. continues to make progresss reporting that he shaved at home independently using an Copy. Pt. continues to require verbal cues, and min-mod assist for donning his jacket. Pt. continues to work on improving LUE functioning in order to improve, and increase engagement during ADLs, and IADL tasks.    OT Occupational Profile and History  Problem Focused Assessment - Including review of records relating to presenting problem    Occupational performance deficits (Please refer to evaluation for details):  ADL's    Body Structure / Function / Physical Skills  UE functional use;Coordination;FMC;Dexterity;Strength;ROM    Cognitive Skills  Attention;Memory;Emotional;Problem Solve;Safety Awareness    Rehab Potential  Good    Clinical Decision Making  Several treatment options, min-mod task modification necessary    Comorbidities Affecting Occupational Performance:  Presence of comorbidities impacting occupational performance    Modification or Assistance to Complete Evaluation   No modification of tasks or assist necessary to complete eval    OT Frequency  3x / week     OT Duration  12 weeks    OT Treatment/Interventions  Self-care/ADL training;DME and/or AE instruction;Therapeutic exercise;Therapeutic activities;Moist Heat;Cognitive remediation/compensation;Neuromuscular education;Visual/perceptual remediation/compensation;Coping strategies training;Patient/family education;Passive range of motion;Psychosocial skills training;Energy conservation;Functional Mobility Training    Consulted and Agree with Plan of Care  Patient    Family Member Consulted  Daughter [name]       Patient will benefit from skilled therapeutic intervention in order to improve the following deficits and impairments:   Body Structure / Function / Physical Skills: UE functional use, Coordination, FMC, Dexterity, Strength, ROM Cognitive Skills: Attention, Memory, Emotional, Problem Solve, Safety Awareness     Visit Diagnosis: Muscle weakness (generalized)  Other lack of coordination    Problem List Patient Active Problem List   Diagnosis Date Noted  . Sepsis ([place]) 10/18/2018    Harrel Carina, MS, OTR/L 12/11/2018, 6:14 PM  [place] MAIN [facility] SERVICES [address] [place], [place], [address] Phone: [phone]   Fax:  [phone]  Name: Kenneth Summers MRN: [id number] Date of Birth: 06/16/1945

## 2018-12-12 ENCOUNTER — Encounter: Payer: Self-pay | Admitting: Occupational Therapy

## 2018-12-12 ENCOUNTER — Encounter: Payer: Medicare Other | Admitting: Occupational Therapy

## 2018-12-12 ENCOUNTER — Other Ambulatory Visit: Payer: Self-pay

## 2018-12-12 ENCOUNTER — Ambulatory Visit: Payer: Medicare Other

## 2018-12-12 ENCOUNTER — Ambulatory Visit: Payer: Medicare Other | Admitting: Occupational Therapy

## 2018-12-12 DIAGNOSIS — R262 Difficulty in walking, not elsewhere classified: Secondary | ICD-10-CM

## 2018-12-12 DIAGNOSIS — M6281 Muscle weakness (generalized): Secondary | ICD-10-CM | POA: Diagnosis not present

## 2018-12-12 DIAGNOSIS — R278 Other lack of coordination: Secondary | ICD-10-CM

## 2018-12-12 NOTE — Therapy (Signed)
[place] MAIN [facility] SERVICES [address] [place], [place], [address] Phone: [phone]   Fax:  [phone]  Physical Therapy Treatment  Patient Details  Name: Kenneth Summers MRN: [id number] Date of Birth: 05/20/1945 Referring Provider (PT): Dr. [name], Guy Begin   Encounter Date: 12/12/2018  PT End of Session - 12/12/18 1257    Visit Number  58    Number of Visits  108    Date for PT Re-Evaluation  02/27/19    Authorization Type  Medicare reporting period starting 09/11/18    Authorization Time Period  goals last assessed 12/04/18    PT Start Time  1345    PT Stop Time  1430    PT Time Calculation (min)  45 min    Equipment Utilized During Treatment  Gait belt    Activity Tolerance  Patient tolerated treatment well;Patient limited by fatigue    Behavior During Therapy  [facility] for tasks assessed/performed       Past Medical History:  Diagnosis Date  . Alcohol abuse   . APS (antiphospholipid syndrome) ([place])   . BPH (benign prostatic hyperplasia)   . CAD (coronary artery disease)   . Cellulitis   . Cervical spinal stenosis   . Chronic osteomyelitis ([place])   . CKD (chronic kidney disease)   . CKD (chronic kidney disease)   . Clostridium difficile diarrhea   . Depression   . Diplopia   . Fatigue   . GERD (gastroesophageal reflux disease)   . Hyperlipemia   . Hypertension   . Hypovitaminosis D   . IBS (irritable bowel syndrome)   . 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 Insomnia   . Left lumbosacral radiculopathy   . Moderate obstructive sleep apnea   . Osteomyelitis of foot ([place])   . PAD (peripheral artery disease) ([place])   . Pruritus   . RA (rheumatoid arthritis) ([place])   . Radiculopathy   . Restless legs syndrome   . RLS (restless legs syndrome)   . SLE (systemic lupus erythematosus related syndrome) ([place])   . Stroke ([place])   . Toxic maculopathy of both eyes     Past Surgical History:  Procedure Laterality Date  . CARDIAC  CATHETERIZATION    . CAROTID ENDARTERECTOMY    . CERVICAL LAMINECTOMY    . CORONARY ANGIOPLASTY    . FRACTURE SURGERY    . fusion C5-6-7    . HERNIA REPAIR    . KNEE ARTHROSCOPY    . TONSILLECTOMY      There were no vitals filed for this visit.  Subjective Assessment - 12/12/18 1356    Subjective  Patient reports that he is doing well today.  He endorses no pain or soreness today.    Patient is accompained by:  Family member    Pertinent History  Patient is a 73 y.o. male who presents to outpatient physical therapy with a referral for medical diagnosis of CVA. This patient's chief complaints consist of left hemiplegia and overall deconditioning leading to the following functional deficits: dependent for ADLs, IADLs, unable to transfer to car, dependent transfers at home with hoyer lift, unable to walk or stand without significant assistance, difficulty with W/C navigation..    Limitations  Sitting;Lifting;Standing;House hold activities;Writing;Walking;Other (comment)    Diagnostic tests  Brain MRI 11/01/2017: IMPRESSION: 1. Acute right ACA territory infarct as demonstrated on prior CT imaging. No intracranial hemorrhage or significant mass effect. 2. 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    Currently in Pain?  No/denies        TREATMENT:  Standing transfer from power WC to NuSTEP with minA  NuSTEP 5 min at L0, legs only; assistance needed with LLE to bring it over the threshold and to bring it up to the pedal.  STS transfer from NuStep back to power WC with min/modA  STS transfer from power WC to mat table with min/modA  STS transfer from mat table to RW x3 with minA+2 with a tendency to posteriorly lean once in standing.  He required cueing to pull his hips in, and bilateral knee blocking was provided in case of collapse.  He stood up between 20-45 sec for each bout.  He performed STSs better with both UE on RW, but required PT to push down  through RW to prevent it from tipping, but demonstrated the ability to anteriorly weight shift with more accuracy.    Squat pivot transfer back to WC from mat table with modA+1 and maxA+1  Seated soccer ball kicks with LLE x 10 to promote LLE coordination and strength;   Patient demonstrates good motivation throughout today's session.  He demonstrates improvements in his ability to perform STSs with less external assistance today.  He demonstrates improved anterior weight shift when performing STS from mat table to RW with both hands on the RW, however requires blocking of the RW to prevent tipping.  Pt will need to progress to pushing off of the table with one hand in the future to gain more independence.  Pt will benefit from continued skilled PT services in order to increase strength and functional mobilityin order to decrease caregiver burden, improve his ability to perform ADLs, and to improve his QOL.                                PT Short Term Goals - 12/04/18 1303      PT SHORT TERM GOAL #1   Title  Be independent with initial home exercise program for self-management of symptoms.    Baseline  HEP to be given at second session; advice to practice unsupported sitting at home (06/19/2018); 07/08/18: Performing at home, 6/29: needs assistance but is doing exercise program regularly; 08/22/18: adherent;    Time  2    Period  Weeks    Status  Achieved    Target Date  06/26/18        PT Long Term Goals - 12/04/18 1303      PT LONG TERM GOAL #1   Title  Patient will complete all bed mobility with min A to improve functional independence for getting in and out of bed and adjusting in bed.     Baseline  max A - total A reported by family (06/19/2018); 07/08/18: still requires maxA, dtr reports improvement in rolling since starting therapy; 6/23: min A to supervision in clinic; not doing at home right; 8/5: Pt's daughter states that his rolling has improved, but  still has difficulty with all other bed mobility; 10/23/18: pts daughter states that she is doing about 35% of the work if he is in proper position, he does uses the bed rails to help roll;  using Hoyer for coming to sit EOB; 10/12: pts daughter states that she is doing about 30% of the work if he is in proper position, he does uses the bed rails to help roll, but it is  improving;  using Hoyer for coming to sit EOB, 10/29: min A for sit to sidelying, rolling supervision, able to transition sidelying to sit with HHA;    Time  12    Period  Weeks    Status  Achieved      PT LONG TERM GOAL #2   Title  Patient will complete sit <> stand transfer chair to chair with Min A and LRAD to improve functional independence for household and community mobility.     Baseline  max A +2 STS at W/C (06/12/2018); max A +2-3 STS or stand pivot transfer, able to complete more reps (06/19/2018); 07/08/18: not safe to attempt at this time; 09/11/18: requires maxA+1 or modA+2 for STSs and squat pivot transfer; 10/23/18: requires maxA+1 or modA+2 for STSs and squat pivot transfer; 10/12: requires maxA+1 or modA+2 for STSs and squat pivot transfer; 10/29: patient able to complete sit<>Stand transfer min A +1 with parallel bars and RW, although inconsistent, mod A for stand pivot;    Time  12    Period  Weeks    Status  Partially Met    Target Date  02/27/19      PT LONG TERM GOAL #3   Title  Patient will navigate power w/c with min A x 100 feet to improve mobility for household and short community distances.    Baseline  required total A (06/12/2018); able to roll 20 feet with min A (06/20/2018); 07/08/18: Pt can go approximately 10' without assist per family;  10/23/2018: pt's daughter states that his power WC mobility has become 100% better with the new hand control, slowed speed, and with practice; 10/12: pt's daughter reports that he can perform household WC mobility and limited community ambulation with minA    Time  12    Period   Weeks    Status  Achieved      PT LONG TERM GOAL #4   Title  Patient will complete car transfers W/C to family SUV with min A using LRAD to improve his abilty to participate in community activities.     Baseline  unable to complete car transfers at all (06/12/2018); unable to complete car transfers at all (06/19/2018).; 07/08/18: Unable to perform at this time; 09/11/18: unable to perform at this time; 10/23/2018: unable to complete car transfers at all; 10/12:  unable to complete car transfers at all, 10/29: unable    Time  12    Period  Weeks    Status  On-going    Target Date  02/27/19      PT LONG TERM GOAL #5   Title  Patient will ambulate at least 150 feet with mod I using LRAD to improve mobility for household and community distances.     Baseline  unable to take steps (06/12/2018); able to shift R leg forward and back with max A and RW at edge of plinth (06/19/2018); 07/08/18: unable to ambulate at this time. 09/11/18: unable to perform at this time; 10/23/2018: unable to perform at this time; 10/12: unable to perform at this time, 10/29: unable at this time    Time  12    Period  Weeks    Status  On-going    Target Date  02/27/19      Additional Long Term Goals   Additional Long Term Goals  Yes      PT LONG TERM GOAL #6   Title  Patient will be able to safety navigate up/down ramp with power  chair, exhibiting good safety awareness, mod I to safely enter/exit his home.    Baseline  unable to complet stairs (06/12/2018); unable to complete stairs (06/19/2018); 07/08/18: unable to attempt at this time; 09/11/18: unsafe to attempt at this time; 10/23/2018: unable to complet stairs (06/12/2018); unable to complete stairs (06/19/2018); 07/08/18: unable to attempt at this time; 09/11/18: unsafe to attempt at this time; 10/23/18: unsafe to attempt at this time, 10/29: requires superivison and occasional min A for safety    Time  12    Period  Weeks    Status  Revised    Target Date  02/27/19      PT LONG TERM GOAL #7    Title  Patient will increase functional reach test to >15 inches in sitting to exhibit improved sitting balance and positioning and reduce fall risk;    Baseline  07/30/18: 12 inches; 09/11/18: 10-12 inches; 10/23/18: 12-13 inch; 10/12: 14.5", 10/29: 15.5 inch    Time  4    Period  Weeks    Status  Achieved      PT LONG TERM GOAL #8   Title  Patient will increase LLE quad strength to 3+/5 to improve functional strength for standing and mobility;    Baseline  10/28: 2+/5    Time  12    Period  Weeks    Status  New    Target Date  02/27/19            Plan - 12/12/18 1742    Clinical Impression Statement  Patient demonstrates good motivation throughout today's session.  He demonstrates improvements in his ability to perform STSs with less external assistance today.  He demonstrates improved anterior weight shift when performing STS from mat table to RW with both hands on the RW, however requires blocking of the RW to prevent tipping.  Pt will need to progress to pushing off of the table with one hand in the future to gain more independence.  Pt will benefit from continued skilled PT services in order to increase strength and functional mobility in order to decrease caregiver burden, improve his ability to perform ADLs, and to improve his QOL.    Personal Factors and Comorbidities  Age;Comorbidity 3+    Comorbidities  Relevant past medical history and comorbidities include long term steroid use for lupus, CKD, chronic osteomyelitis, cervical spine stenosis, BPH, APS, alcohol abuse, peripheral artery disease, depression, GERD, obstructive sleep apnea, HTN, IBS, IgA deficiency, lumbar radiculopathy, RA, Stroke, toxic maculopathy in both eyes, systemic lupus erythematosus related syndrome, cardiac catheterization, carotid endarterectomy, cervical laminectomy, coronary angioplasty, Fusion C5-C7, knee arthroplasty, lumbar surgery.    Examination-Activity Limitations  Bed  Mobility;Bend;Sit;Toileting;Stand;Stairs;Lift;Transfers;Squat;Locomotion Level;Carry;Dressing;Hygiene/Grooming;Continence    Examination-Participation Restrictions  Yard Work;Interpersonal Relationship;Community Activity    Stability/Clinical Decision Making  Evolving/Moderate complexity    Rehab Potential  Fair    PT Frequency  3x / week    PT Duration  12 weeks    PT Treatment/Interventions  ADLs/Self Care Home Management;Electrical Stimulation;Moist Heat;Cryotherapy;Gait training;Stair training;Functional mobility training;Therapeutic exercise;Balance training;Neuromuscular re-education;Cognitive remediation;Patient/family education;Orthotic Fit/Training;Wheelchair mobility training;Manual techniques;Passive range of motion;Energy conservation;Joint Manipulations    PT Next Visit Plan  Update goals, progress note; Work on strength, sitting/standing balance, functional activities such as rolling, bed mobility, transfers; caregiver training    PT Home Exercise Plan  handout provided via Beth during previous sessions    Consulted and Agree with Plan of Care  Patient;Family member/caregiver    Family Member Consulted  daughter [name]  Patient will benefit from skilled therapeutic intervention in order to improve the following deficits and impairments:  Abnormal gait, Decreased activity tolerance, Decreased cognition, Decreased endurance, Decreased knowledge of use of DME, Decreased range of motion, Decreased skin integrity, Decreased strength, Impaired perceived functional ability, Impaired sensation, Impaired UE functional use, Improper body mechanics, Pain, Cardiopulmonary status limiting activity, Decreased balance, Decreased coordination, Decreased mobility, Difficulty walking, Impaired tone, Postural dysfunction  Visit Diagnosis: Muscle weakness (generalized)  Difficulty in walking, not elsewhere classified  Other lack of coordination     Problem List Patient Active Problem List    Diagnosis Date Noted  . Sepsis ([place]) 10/18/2018    This entire session was performed under direct supervision and direction of a licensed therapist/therapist assistant . I have personally read, edited and approve of the note as written.   Lutricia Horsfall, SPT [place]out PT, DPT, GCS  Huprich,Jason 12/13/2018, 8:59 AM  [place] MAIN [facility] SERVICES [address] [place], [place], [address] Phone: [phone]   Fax:  [phone]  Name: Kenneth Summers MRN: [id number] Date of Birth: [birth year]

## 2018-12-12 NOTE — Therapy (Addendum)
[place] MAIN [facility] SERVICES [address] [place], [place], [address] Phone: [phone]   Fax:  [phone]  Occupational Therapy Treatment  Kenneth Summers Details  Name: Kenneth Summers MRN: [id number] Date of Birth: 12-Nov-1945 No data recorded  Encounter Date: 12/12/2018  OT End of Session - 12/12/18 1759    Visit Number  29    Number of Visits  60   Date for OT Re-Evaluation  01/15/19    Authorization Type  Progress report period starting 10/30/2018    OT Start Time  1300    OT Stop Time  1345    OT Time Calculation (min)  45 min    Activity Tolerance  Kenneth Summers tolerated treatment well    Behavior During Therapy  [facility] for tasks assessed/performed       Past Medical History:  Diagnosis Date  . Alcohol abuse   . APS (antiphospholipid syndrome) ([place])   . BPH (benign prostatic hyperplasia)   . CAD (coronary artery disease)   . Cellulitis   . Cervical spinal stenosis   . Chronic osteomyelitis ([place])   . CKD (chronic kidney disease)   . CKD (chronic kidney disease)   . Clostridium difficile diarrhea   . Depression   . Diplopia   . Fatigue   . GERD (gastroesophageal reflux disease)   . Hyperlipemia   . Hypertension   . Hypovitaminosis D   . IBS (irritable bowel syndrome)   . IgA deficiency ([place])   . Insomnia   . Left lumbosacral radiculopathy   . Moderate obstructive sleep apnea   . Osteomyelitis of foot ([place])   . PAD (peripheral artery disease) ([place])   . Pruritus   . RA (rheumatoid arthritis) ([place])   . Radiculopathy   . Restless legs syndrome   . RLS (restless legs syndrome)   . SLE (systemic lupus erythematosus related syndrome) ([place])   . Stroke ([place])   . Toxic maculopathy of both eyes     Past Surgical History:  Procedure Laterality Date  . CARDIAC CATHETERIZATION    . CAROTID ENDARTERECTOMY    . CERVICAL LAMINECTOMY    . CORONARY ANGIOPLASTY    . FRACTURE SURGERY    . fusion C5-6-7    . HERNIA REPAIR    . KNEE  ARTHROSCOPY    . TONSILLECTOMY      There were no vitals filed for this visit.  Subjective Assessment - 12/12/18 1758    Subjective   Pt. reports doing well today    Kenneth Summers is accompanied by:  Kenneth Summers    Pertinent History  Pt. is a 73 y.o. 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 Pt. PMHx includes: Multiple Falls, Lupus, DJD, Renal Abscess, CVA. Pt. resides with his wife. Pt.'s wife and daughter assist with ADLs. Pt. has caregivers in  for 2 hours a day, 6 days a week. Pt. received Rehab services in acute care, at SNF for STR, and [place] services. Pt. is retired from [employer] for [employer] and [employer].    Currently in Pain?  No/denies      OT TREATMENT    Therapeutic Activities:  Pt. worked on following design patterns using the [employer] patterns on the [employer]. Pt. Worked on using his left hand to grasp the 1 &1/2" pegs. Pt. Required verbal, and visual cues for following the design.   Selfcare:  Pt. worked on UE dressing tasks using hemi-dressing techniques.  Pt. requiredincreasedcues forinitially positioning the sweatshirtwhenpreparing todonn a zip down sweatshirt. Pt. Required cues to thread the LUE with the right, assist to bring the jacket around his back, and reposition the jacket to prepare for zipping.  Response to Treatment:  Pt. continues to progress with donning his jacket, requiring fewer cues for initiation, and fewer cues for step by step instruction. Pt. is progressing with LUE reaching, and engaging his left UE during tasks. Pt. requires verbal, and visual cues to problem solve through copying design patterns. Pt. conitnues to work to improving, and promoting indepndence with ADLs, and IADL tasks.                       OT Education - 12/12/18 1759    Education Details  LUE functioning, [place], UE dressing    Person(s) Educated  Kenneth Summers    Methods  Explanation;Demonstration     Comprehension  Returned demonstration;Tactile cues required;Verbalized understanding;Need further instruction          OT Long Term Goals - 10/28/18 1532      OT LONG TERM GOAL #1   Title  Pt. will increase left shoulder flexion AROM by 10 degrees to access cabinet/shelf.    Baseline  Pt. has difficulty reaching for items with the LUE secondary to decreased LUE ROM    Time  12    Period  Weeks    Status  On-going    Target Date  01/15/19      OT LONG TERM GOAL #2   Title  Pt. will donn a shirt with Supervision.    Baseline  Min    Time  12    Period  Weeks    Status  On-going    Target Date  01/15/19      OT LONG TERM GOAL #3   Title  Pt. will require ModA to perform LE dressing    Baseline  MaxA left, ModA with the right    Time  12    Period  Weeks    Status  On-going    Target Date  01/15/19      OT LONG TERM GOAL #4   Title  Pt. will improve left grip strength by 10# to be able to open a jar/container.    Baseline  Decreased grip since hospital readmission, pt. has difficulty opening containers    Time  12    Period  Weeks    Status  On-going    Target Date  01/15/19      OT LONG TERM GOAL #5   Title  Pt. will improve left hand [facility] skills to be able to assit with buttoning/zipping.    Baseline  Pt. has difficulty [facility] skills buttoning/zipping    Time  12    Period  Weeks    Status  On-going    Target Date  01/15/19      OT LONG TERM GOAL #6   Title  Pt. will demonstrate visual conmpensatory strategies during 100% of the time during ADLs    Baseline  Pt. requires cues for left sides awareness.    Time  12    Period  Weeks    Status  On-going    Target Date  01/15/19      OT LONG TERM GOAL #7   Title  Pt. will prepare a simple cold snack from w/c with supervision using cognitive compensatory strategies 100% of the time.    Baseline  Pt.  is unable    Time  12    Period  Weeks    Target Date  11/27/18      OT LONG TERM GOAL #8   Title  Pt. will  accurately identify potential safety hazard using good safety awareness, and judgement 100% for ADLs, and IADLs.    Baseline  Limited    Time  12    Period  Weeks    Status  On-going    Target Date  11/27/18      OT LONG TERM GOAL  #9   TITLE  Pt. will  navigate his w/c around obstacles with Supervision and 100% accuracy    Baseline  Pt. requires cuing for obstacle on the left    Time  12    Period  Weeks    Status  On-going    Target Date  11/27/18            Plan - 12/12/18 1759    Clinical Impression Statement Pt. continues to progress with donning his jacket, requiring fewer cues for initiation, and fewer cues for step by step instruction. Pt. is progressing with LUE reaching, and engaging his left UE during tasks. Pt. requires verbal, and visual cues to problem solve through copying design patterns. Pt. conitnues to work to improving, and promoting indepndence with ADLs, and IADL tasks.    OT Occupational Profile and History  Problem Focused Assessment - Including review of records relating to presenting problem    Occupational performance deficits (Please refer to evaluation for details):  ADL's    Body Structure / Function / Physical Skills  UE functional use;Coordination;FMC;Dexterity;Strength;ROM    Cognitive Skills  Attention;Memory;Emotional;Problem Solve;Safety Awareness    Rehab Potential  Good    Clinical Decision Making  Several treatment options, min-mod task modification necessary    Comorbidities Affecting Occupational Performance:  Presence of comorbidities impacting occupational performance    Comorbidities impacting occupational performance description:  Phyical, cognitive, visual,  medical comorbidities    Modification or Assistance to Complete Evaluation   No modification of tasks or assist necessary to complete eval    OT Frequency  3x / week    OT Duration  12 weeks    OT Treatment/Interventions  Self-care/ADL training;DME and/or AE instruction;Therapeutic  exercise;Therapeutic activities;Moist Heat;Cognitive remediation/compensation;Neuromuscular education;Visual/perceptual remediation/compensation;Coping strategies training;Kenneth Summers/Kenneth education;Passive range of motion;Psychosocial skills training;Energy conservation;Functional Mobility Training    Consulted and Agree with Plan of Care  Kenneth Summers    Kenneth Summers Consulted  Daughter [name]       Kenneth Summers will benefit from skilled therapeutic intervention in order to improve the following deficits and impairments:   Body Structure / Function / Physical Skills: UE functional use, Coordination, FMC, Dexterity, Strength, ROM Cognitive Skills: Attention, Memory, Emotional, Problem Solve, Safety Awareness     Visit Diagnosis: Muscle weakness (generalized)    Problem List Kenneth Summers Active Problem List   Diagnosis Date Noted  . Sepsis ([place]) 10/18/2018    Harrel Carina, MS, OTR/L 12/12/2018, 6:05 PM  [place] MAIN [facility] SERVICES [address] [place], [place], [address] Phone: [phone]   Fax:  [phone]  Name: SHRAVAN GAUTREAU MRN: [id number] Date of Birth: 15-May-1945

## 2018-12-16 ENCOUNTER — Encounter: Payer: Self-pay | Admitting: Occupational Therapy

## 2018-12-16 ENCOUNTER — Ambulatory Visit: Payer: Medicare Other | Admitting: Occupational Therapy

## 2018-12-16 ENCOUNTER — Encounter: Payer: Self-pay | Admitting: Physical Therapy

## 2018-12-16 ENCOUNTER — Ambulatory Visit: Payer: Medicare Other | Admitting: Physical Therapy

## 2018-12-16 ENCOUNTER — Other Ambulatory Visit: Payer: Self-pay

## 2018-12-16 DIAGNOSIS — R41841 Cognitive communication deficit: Secondary | ICD-10-CM

## 2018-12-16 DIAGNOSIS — R262 Difficulty in walking, not elsewhere classified: Secondary | ICD-10-CM

## 2018-12-16 DIAGNOSIS — M6281 Muscle weakness (generalized): Secondary | ICD-10-CM

## 2018-12-16 DIAGNOSIS — R296 Repeated falls: Secondary | ICD-10-CM

## 2018-12-16 DIAGNOSIS — R278 Other lack of coordination: Secondary | ICD-10-CM

## 2018-12-16 NOTE — Therapy (Signed)
[place] MAIN [facility] SERVICES [address] [place], [place], [address] Phone: [phone]   Fax:  [phone]  Physical Therapy Treatment Physical Therapy Progress Note   Dates of reporting period  12/04/18   to   12/16/18   Patient Details  Name: Kenneth Summers MRN: [id number] Date of Birth: 1945/06/02 Referring Provider (PT): Dr. [name], Guy Begin   Encounter Date: 12/16/2018  PT End of Session - 12/16/18 1303    Visit Number  [id number]    Number of Visits  108    Date for PT Re-Evaluation  02/27/19    Authorization Type  Medicare reporting period starting 09/11/18    Authorization Time Period  goals last assessed 12/04/18    PT Start Time  1302    PT Stop Time  1345    PT Time Calculation (min)  43 min    Equipment Utilized During Treatment  Gait belt    Activity Tolerance  Patient tolerated treatment well;Patient limited by fatigue    Behavior During Therapy  [facility] for tasks assessed/performed       Past Medical History:  Diagnosis Date  . Alcohol abuse   . APS (antiphospholipid syndrome) ([place])   . BPH (benign prostatic hyperplasia)   . CAD (coronary artery disease)   . Cellulitis   . Cervical spinal stenosis   . Chronic osteomyelitis ([place])   . CKD (chronic kidney disease)   . CKD (chronic kidney disease)   . Clostridium difficile diarrhea   . Depression   . Diplopia   . Fatigue   . GERD (gastroesophageal reflux disease)   . Hyperlipemia   . Hypertension   . Hypovitaminosis D   . IBS (irritable bowel syndrome)   . 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 Insomnia   . Left lumbosacral radiculopathy   . Moderate obstructive sleep apnea   . Osteomyelitis of foot ([place])   . PAD (peripheral artery disease) ([place])   . Pruritus   . RA (rheumatoid arthritis) ([place])   . Radiculopathy   . Restless legs syndrome   . RLS (restless legs syndrome)   . SLE (systemic lupus erythematosus related syndrome) ([place])   . Stroke ([place])   . Toxic  maculopathy of both eyes     Past Surgical History:  Procedure Laterality Date  . CARDIAC CATHETERIZATION    . CAROTID ENDARTERECTOMY    . CERVICAL LAMINECTOMY    . CORONARY ANGIOPLASTY    . FRACTURE SURGERY    . fusion C5-6-7    . HERNIA REPAIR    . KNEE ARTHROSCOPY    . TONSILLECTOMY      There were no vitals filed for this visit.  Subjective Assessment - 12/16/18 1302    Subjective  Patient reports that he is doing well today.  He reports no pain or soreness. Overall he states that he is doing a little better in managing power chair in the home with less bumps.    Patient is accompained by:  Family member    Pertinent History  Patient is a 73 y.o. male who presents to outpatient physical therapy with a referral for medical diagnosis of CVA. This patient's chief complaints consist of left hemiplegia and overall deconditioning leading to the following functional deficits: dependent for ADLs, IADLs, unable to transfer to car, dependent transfers at home with hoyer lift, unable to walk or stand without significant assistance, difficulty with W/C navigation..    Limitations  Sitting;Lifting;Standing;House hold activities;Writing;Walking;Other (comment)  Diagnostic tests  Brain MRI 11/01/2017: IMPRESSION: 1. Acute right ACA territory infarct as demonstrated on prior CT imaging. No intracranial hemorrhage or significant mass effect. 2. 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    Currently in Pain?  No/denies    Multiple Pain Sites  No        TREATMENT: Patient transferred squat pivot wheelchair<>nustep with BUE and therapist assistance, min A +1 with daughter providing HHA as needed;   Following therapeutic activity:  Warm up on Nustep BLE only level 2 x5 min with cues to increase speed to >50 steps per minute, patient averaged 30-40 steps per minute, HR was 90bpm following exercise;  Pt then performed Nustep BUE and BLE level 2 x1 min with cues to  increase speed as fast as possible, increasing steps per minute to 67, with increased shortness of breath noted; HR 95 following exercise. Pt reports increased fatigue after Nustep activity;  During exercise, he did require mod VCs for proper weight shift and trunk control as patient often leaning to left side and required cues for re-orientation to task as patient is heavily distracted; He was able to exhibit better LLE hip control with less hip abduction/ER this session compared to previous sessions;   Following therapeutic exercise:  Transferred nustep to wheelchair squat pivot with mod A +2 with daughter assisting for safety;  Patient required cues for hand placement and positioning. He had increased difficulty pivoting to wheelchair likely due to fatigue;  Patient is improving in LE AROM being able to place LLE foot on leg rest with cues and increased time;   Patient sitting in power chair with power chair elevated and tilted forward for better positioning: Sit<>stand transfers with RW:    x4 reps with min A +1 with intermittent therapist assist to block left knee; patient able to exhibit good forward trunk lean but does require mod VCs for proper positioning during mid transfer; Upon standing, patient able to exhibit improved hip/knee extension being able to progress to CGA/supervision for balance; Patient able to stand for 10-30 sec each repetition; He required mod VCs to reach back for arm rest of chair to control sit down with good positioning and safety awareness;   Patient tolerated session well. He does report increased fatigue at end of session. Patient responds well to cues with better positioning and muscle activation;He required max A +2 for repositioning in wheelchair at end of session due to full body fatigue. However patient able to exhibit improved trunk control and less lateral lean following treatment session indicating improved weight shift to midline.    [name] condition  has the potential to improve in response to therapy. Maximum improvement is yet to be obtained. The anticipated improvement is attainable and reasonable in a generally predictable time.  Patient reports adherence with HEP and has been working hard with mobility with his family. Please refer to last progress note dated 12/04/18 with updated goals.                   PT Education - 12/16/18 1302    Education Details  LE strengthening, transfers, HEP reinforced;    Person(s) Educated  Patient;Child(ren)    Methods  Explanation;Demonstration;Verbal cues    Comprehension  Verbalized understanding;Returned demonstration;Verbal cues required;Need further instruction       PT Short Term Goals - 12/04/18 1303      PT SHORT TERM GOAL #1   Title  Be independent with initial home  exercise program for self-management of symptoms.    Baseline  HEP to be given at second session; advice to practice unsupported sitting at home (06/19/2018); 07/08/18: Performing at home, 6/29: needs assistance but is doing exercise program regularly; 08/22/18: adherent;    Time  2    Period  Weeks    Status  Achieved    Target Date  06/26/18        PT Long Term Goals - 12/04/18 1303      PT LONG TERM GOAL #1   Title  Patient will complete all bed mobility with min A to improve functional independence for getting in and out of bed and adjusting in bed.     Baseline  max A - total A reported by family (06/19/2018); 07/08/18: still requires maxA, dtr reports improvement in rolling since starting therapy; 6/23: min A to supervision in clinic; not doing at home right; 8/5: Pt's daughter states that his rolling has improved, but still has difficulty with all other bed mobility; 10/23/18: pts daughter states that she is doing about 35% of the work if he is in proper position, he does uses the bed rails to help roll;  using Hoyer for coming to sit EOB; 10/12: pts daughter states that she is doing about 30% of the work if he  is in proper position, he does uses the bed rails to help roll, but it is improving;  using Hoyer for coming to sit EOB, 10/29: min A for sit to sidelying, rolling supervision, able to transition sidelying to sit with HHA;    Time  12    Period  Weeks    Status  Achieved      PT LONG TERM GOAL #2   Title  Patient will complete sit <> stand transfer chair to chair with Min A and LRAD to improve functional independence for household and community mobility.     Baseline  max A +2 STS at W/C (06/12/2018); max A +2-3 STS or stand pivot transfer, able to complete more reps (06/19/2018); 07/08/18: not safe to attempt at this time; 09/11/18: requires maxA+1 or modA+2 for STSs and squat pivot transfer; 10/23/18: requires maxA+1 or modA+2 for STSs and squat pivot transfer; 10/12: requires maxA+1 or modA+2 for STSs and squat pivot transfer; 10/29: patient able to complete sit<>Stand transfer min A +1 with parallel bars and RW, although inconsistent, mod A for stand pivot;    Time  12    Period  Weeks    Status  Partially Met    Target Date  02/27/19      PT LONG TERM GOAL #3   Title  Patient will navigate power w/c with min A x 100 feet to improve mobility for household and short community distances.    Baseline  required total A (06/12/2018); able to roll 20 feet with min A (06/20/2018); 07/08/18: Pt can go approximately 10' without assist per family;  10/23/2018: pt's daughter states that his power WC mobility has become 100% better with the new hand control, slowed speed, and with practice; 10/12: pt's daughter reports that he can perform household WC mobility and limited community ambulation with minA    Time  12    Period  Weeks    Status  Achieved      PT LONG TERM GOAL #4   Title  Patient will complete car transfers W/C to family SUV with min A using LRAD to improve his abilty to participate in community activities.  Baseline  unable to complete car transfers at all (06/12/2018); unable to complete car  transfers at all (06/19/2018).; 07/08/18: Unable to perform at this time; 09/11/18: unable to perform at this time; 10/23/2018: unable to complete car transfers at all; 10/12:  unable to complete car transfers at all, 10/29: unable    Time  12    Period  Weeks    Status  On-going    Target Date  02/27/19      PT LONG TERM GOAL #5   Title  Patient will ambulate at least 150 feet with mod I using LRAD to improve mobility for household and community distances.     Baseline  unable to take steps (06/12/2018); able to shift R leg forward and back with max A and RW at edge of plinth (06/19/2018); 07/08/18: unable to ambulate at this time. 09/11/18: unable to perform at this time; 10/23/2018: unable to perform at this time; 10/12: unable to perform at this time, 10/29: unable at this time    Time  12    Period  Weeks    Status  On-going    Target Date  02/27/19      Additional Long Term Goals   Additional Long Term Goals  Yes      PT LONG TERM GOAL #6   Title  Patient will be able to safety navigate up/down ramp with power chair, exhibiting good safety awareness, mod I to safely enter/exit his home.    Baseline  unable to complet stairs (06/12/2018); unable to complete stairs (06/19/2018); 07/08/18: unable to attempt at this time; 09/11/18: unsafe to attempt at this time; 10/23/2018: unable to complet stairs (06/12/2018); unable to complete stairs (06/19/2018); 07/08/18: unable to attempt at this time; 09/11/18: unsafe to attempt at this time; 10/23/18: unsafe to attempt at this time, 10/29: requires superivison and occasional min A for safety    Time  12    Period  Weeks    Status  Revised    Target Date  02/27/19      PT LONG TERM GOAL #7   Title  Patient will increase functional reach test to >15 inches in sitting to exhibit improved sitting balance and positioning and reduce fall risk;    Baseline  07/30/18: 12 inches; 09/11/18: 10-12 inches; 10/23/18: 12-13 inch; 10/12: 14.5", 10/29: 15.5 inch    Time  4    Period  Weeks     Status  Achieved      PT LONG TERM GOAL #8   Title  Patient will increase LLE quad strength to 3+/5 to improve functional strength for standing and mobility;    Baseline  10/28: 2+/5    Time  12    Period  Weeks    Status  New    Target Date  02/27/19            Plan - 12/17/18 1046    Clinical Impression Statement  Patient motivated and participated well within session; He was able to exhibit improved strength and ROM with nustep activity with BLE moving only with better quad/hamstring activation on LLE. He also exhibits less hip abduction/ER indicating better hip control. Pt does require cues for proper posture and positioning to improve motor control. He fatigues quickly with exercise. He is progressing well with transfers, although required increased time and cues for better forward weight shift this session. patient fatigued quickly during session likely from Nustep activity and cardiovascular demands. He required increased assistance to reposition self  in power chair at end of session. He would benefit from additional skilled PT Intervention to improve strength, balance and mobility;    Personal Factors and Comorbidities  Age;Comorbidity 3+    Comorbidities  Relevant past medical history and comorbidities include long term steroid use for lupus, CKD, chronic osteomyelitis, cervical spine stenosis, BPH, APS, alcohol abuse, peripheral artery disease, depression, GERD, obstructive sleep apnea, HTN, IBS, IgA deficiency, lumbar radiculopathy, RA, Stroke, toxic maculopathy in both eyes, systemic lupus erythematosus related syndrome, cardiac catheterization, carotid endarterectomy, cervical laminectomy, coronary angioplasty, Fusion C5-C7, knee arthroplasty, lumbar surgery.    Examination-Activity Limitations  Bed Mobility;Bend;Sit;Toileting;Stand;Stairs;Lift;Transfers;Squat;Locomotion Level;Carry;Dressing;Hygiene/Grooming;Continence    Examination-Participation Restrictions  Yard  Work;Interpersonal Relationship;Community Activity    Stability/Clinical Decision Making  Evolving/Moderate complexity    Rehab Potential  Fair    PT Frequency  3x / week    PT Duration  12 weeks    PT Treatment/Interventions  ADLs/Self Care Home Management;Electrical Stimulation;Moist Heat;Cryotherapy;Gait training;Stair training;Functional mobility training;Therapeutic exercise;Balance training;Neuromuscular re-education;Cognitive remediation;Patient/family education;Orthotic Fit/Training;Wheelchair mobility training;Manual techniques;Passive range of motion;Energy conservation;Joint Manipulations    PT Next Visit Plan  Update goals, progress note; Work on strength, sitting/standing balance, functional activities such as rolling, bed mobility, transfers; caregiver training    PT Home Exercise Plan  handout provided via Beth during previous sessions    Consulted and Agree with Plan of Care  Patient;Family member/caregiver    Family Member Consulted  daughter [name]       Patient will benefit from skilled therapeutic intervention in order to improve the following deficits and impairments:  Abnormal gait, Decreased activity tolerance, Decreased cognition, Decreased endurance, Decreased knowledge of use of DME, Decreased range of motion, Decreased skin integrity, Decreased strength, Impaired perceived functional ability, Impaired sensation, Impaired UE functional use, Improper body mechanics, Pain, Cardiopulmonary status limiting activity, Decreased balance, Decreased coordination, Decreased mobility, Difficulty walking, Impaired tone, Postural dysfunction  Visit Diagnosis: Muscle weakness (generalized)  Difficulty in walking, not elsewhere classified  Other lack of coordination  Repeated falls     Problem List Patient Active Problem List   Diagnosis Date Noted  . Sepsis ([place]) 10/18/2018    Ivorie Uplinger PT, DPT 12/17/2018, 10:49 AM  [place]  MAIN [facility] SERVICES [address] [place], [place], [address] Phone: [phone]   Fax:  [phone]  Name: Kenneth Summers MRN: [id number] Date of Birth: 01-02-1946

## 2018-12-16 NOTE — Therapy (Addendum)
[place] MAIN [facility] SERVICES [address] [place], [place], [address] Phone: [phone]   Fax:  [phone]  Occupational Therapy Treatment  Patient Details  Name: Kenneth Summers MRN: [id number] Date of Birth: [birth year] No data recorded  Encounter Date: 12/16/2018  OT End of Session - 12/16/18 1553    Visit Number  59    Number of Visits  32   Date for OT Re-Evaluation  01/15/19    Authorization Type  Progress report period starting 10/30/2018    OT Start Time  1350    OT Stop Time  1430    OT Time Calculation (min)  40 min    Activity Tolerance  Patient tolerated treatment well    Behavior During Therapy  [facility] for tasks assessed/performed       Past Medical History:  Diagnosis Date  . Alcohol abuse   . APS (antiphospholipid syndrome) ([place])   . BPH (benign prostatic hyperplasia)   . CAD (coronary artery disease)   . Cellulitis   . Cervical spinal stenosis   . Chronic osteomyelitis ([place])   . CKD (chronic kidney disease)   . CKD (chronic kidney disease)   . Clostridium difficile diarrhea   . Depression   . Diplopia   . Fatigue   . GERD (gastroesophageal reflux disease)   . Hyperlipemia   . Hypertension   . Hypovitaminosis D   . IBS (irritable bowel syndrome)   . 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 Insomnia   . Left lumbosacral radiculopathy   . Moderate obstructive sleep apnea   . Osteomyelitis of foot ([place])   . PAD (peripheral artery disease) ([place])   . Pruritus   . RA (rheumatoid arthritis) ([place])   . Radiculopathy   . Restless legs syndrome   . RLS (restless legs syndrome)   . SLE (systemic lupus erythematosus related syndrome) ([place])   . Stroke ([place])   . Toxic maculopathy of both eyes     Past Surgical History:  Procedure Laterality Date  . CARDIAC CATHETERIZATION    . CAROTID ENDARTERECTOMY    . CERVICAL LAMINECTOMY    . CORONARY ANGIOPLASTY    . FRACTURE SURGERY    . fusion C5-6-7    . HERNIA REPAIR    . KNEE  ARTHROSCOPY    . TONSILLECTOMY      There were no vitals filed for this visit.  Subjective Assessment - 12/16/18 1552    Subjective   Pt. reports having had a good weekend    Patient is accompanied by:  Family member    Pertinent History  Pt. is a 73 y.o. 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 Pt. PMHx includes: Multiple Falls, Lupus, DJD, Renal Abscess, CVA. Pt. resides with his wife. Pt.'s wife and daughter assist with ADLs. Pt. has caregivers in  for 2 hours a day, 6 days a week. Pt. received Rehab services in acute care, at SNF for STR, and [place] services. Pt. is retired from [employer] for [employer] and [employer].    Currently in Pain?  No/denies      OT TREATMENT    Therapeutic Activities:  Pt. worked on following design patterns using the [employer] patterns on the [employer]. Pt. Worked on using his left hand to grasp the 1 &1/2" pegs. Pt. Required verbal, and visual cues for following the design.   Selfcare:  Pt. worked on UE dressing tasks using  hemi-dressing techniques. Pt. requiredincreasedcues forinitially positioning the sweatshirtwhenpreparing todonn a zip down sweatshirt. Pt. Required cues to thread the LUE with the right, assist to bring the jacket around his back, and reposition the jacket to prepare for zipping.  Response to Treatment  Pt. is making steady progress with donning his jacket requiring no cues today for task initiation, and less assist to donnhis jacket around his back, and threading his RUE through. Pt. did required increased visuasl, and verbal cues for follwoing designs on the [employer]. Pt. continues to work on improving his UEs in order to improve ADL, and IADL functioning.                       OT Education - 12/16/18 1553    Education Details  LUE functioning, Stevens, UE dressing    Person(s) Educated  Patient    Methods  Explanation;Demonstration     Comprehension  Returned demonstration;Tactile cues required;Verbalized understanding;Need further instruction          OT Long Term Goals - 10/28/18 1532      OT LONG TERM GOAL #1   Title  Pt. will increase left shoulder flexion AROM by 10 degrees to access cabinet/shelf.    Baseline  Pt. has difficulty reaching for items with the LUE secondary to decreased LUE ROM    Time  12    Period  Weeks    Status  On-going    Target Date  01/15/19      OT LONG TERM GOAL #2   Title  Pt. will donn a shirt with Supervision.    Baseline  Min    Time  12    Period  Weeks    Status  On-going    Target Date  01/15/19      OT LONG TERM GOAL #3   Title  Pt. will require ModA to perform LE dressing    Baseline  MaxA left, ModA with the right    Time  12    Period  Weeks    Status  On-going    Target Date  01/15/19      OT LONG TERM GOAL #4   Title  Pt. will improve left grip strength by 10# to be able to open a jar/container.    Baseline  Decreased grip since hospital readmission, pt. has difficulty opening containers    Time  12    Period  Weeks    Status  On-going    Target Date  01/15/19      OT LONG TERM GOAL #5   Title  Pt. will improve left hand [facility] skills to be able to assit with buttoning/zipping.    Baseline  Pt. has difficulty [facility] skills buttoning/zipping    Time  12    Period  Weeks    Status  On-going    Target Date  01/15/19      OT LONG TERM GOAL #6   Title  Pt. will demonstrate visual conmpensatory strategies during 100% of the time during ADLs    Baseline  Pt. requires cues for left sides awareness.    Time  12    Period  Weeks    Status  On-going    Target Date  01/15/19      OT LONG TERM GOAL #7   Title  Pt. will prepare a simple cold snack from w/c with supervision using cognitive compensatory strategies 100% of the time.    Baseline  Pt. is unable    Time  12    Period  Weeks    Target Date  11/27/18      OT LONG TERM GOAL #8   Title  Pt. will  accurately identify potential safety hazard using good safety awareness, and judgement 100% for ADLs, and IADLs.    Baseline  Limited    Time  12    Period  Weeks    Status  On-going    Target Date  11/27/18      OT LONG TERM GOAL  #9   TITLE  Pt. will  navigate his w/c around obstacles with Supervision and 100% accuracy    Baseline  Pt. requires cuing for obstacle on the left    Time  12    Period  Weeks    Status  On-going    Target Date  11/27/18            Plan - 12/16/18 1553    Clinical Impression Statement  Pt. is making steady progress with donning his jacket requiring no cues today for task initiation, and less assist to donnhis jacket around his back, and threading his RUE through. Pt. did required increased visuasl, and verbal cues for follwoing designs on the [employer]. Pt. continues to work on improving his UEs in order to improve ADL, and IADL functioning.    OT Occupational Profile and History  Problem Focused Assessment - Including review of records relating to presenting problem    Occupational performance deficits (Please refer to evaluation for details):  ADL's    Body Structure / Function / Physical Skills  UE functional use;Coordination;FMC;Dexterity;Strength;ROM    Cognitive Skills  Attention;Memory;Emotional;Problem Solve;Safety Awareness    Rehab Potential  Good    Clinical Decision Making  Several treatment options, min-mod task modification necessary    Comorbidities Affecting Occupational Performance:  Presence of comorbidities impacting occupational performance    OT Frequency  3x / week    OT Duration  12 weeks    OT Treatment/Interventions  Self-care/ADL training;DME and/or AE instruction;Therapeutic exercise;Therapeutic activities;Moist Heat;Cognitive remediation/compensation;Neuromuscular education;Visual/perceptual remediation/compensation;Coping strategies training;Patient/family education;Passive range of motion;Psychosocial skills  training;Energy conservation;Functional Mobility Training    Consulted and Agree with Plan of Care  Patient    Family Member Consulted  Daughter [name]       Patient will benefit from skilled therapeutic intervention in order to improve the following deficits and impairments:   Body Structure / Function / Physical Skills: UE functional use, Coordination, FMC, Dexterity, Strength, ROM Cognitive Skills: Attention, Memory, Emotional, Problem Solve, Safety Awareness     Visit Diagnosis: Muscle weakness (generalized)  Other lack of coordination  Cognitive communication deficit    Problem List Patient Active Problem List   Diagnosis Date Noted  . Sepsis ([place]) 10/18/2018    Harrel Carina, MS, OTR/L 12/16/2018, 4:47 PM  [place] MAIN [facility] SERVICES [address] [place], [place], [address] Phone: [phone]   Fax:  [phone]  Name: Kenneth Summers MRN: [id number] Date of Birth: 23-Feb-1945

## 2018-12-18 ENCOUNTER — Ambulatory Visit: Payer: Medicare Other | Admitting: Physical Therapy

## 2018-12-18 ENCOUNTER — Encounter: Payer: Medicare Other | Admitting: Occupational Therapy

## 2018-12-19 ENCOUNTER — Other Ambulatory Visit: Payer: Self-pay

## 2018-12-19 ENCOUNTER — Ambulatory Visit: Payer: Medicare Other

## 2018-12-19 ENCOUNTER — Encounter: Payer: Medicare Other | Admitting: Occupational Therapy

## 2018-12-19 ENCOUNTER — Encounter: Payer: Self-pay | Admitting: Occupational Therapy

## 2018-12-19 ENCOUNTER — Ambulatory Visit: Payer: Medicare Other | Admitting: Occupational Therapy

## 2018-12-19 DIAGNOSIS — R262 Difficulty in walking, not elsewhere classified: Secondary | ICD-10-CM

## 2018-12-19 DIAGNOSIS — R278 Other lack of coordination: Secondary | ICD-10-CM

## 2018-12-19 DIAGNOSIS — M6281 Muscle weakness (generalized): Secondary | ICD-10-CM

## 2018-12-19 NOTE — Therapy (Signed)
[place] MAIN [facility] SERVICES [address] [place], [place], [address] Phone: [phone]   Fax:  [phone]  Physical Therapy Treatment  Patient Details  Name: Kenneth Summers MRN: [id number] Date of Birth: 06-26-1945 Referring Provider (PT): Dr. [name], Guy Begin   Encounter Date: 12/19/2018  PT End of Session - 12/19/18 1256    Visit Number  71    Number of Visits  108    Date for PT Re-Evaluation  02/27/19    Authorization Type  Medicare reporting period starting 09/11/18    Authorization Time Period  goals last assessed 12/04/18    PT Start Time  1345    PT Stop Time  1430    PT Time Calculation (min)  45 min    Equipment Utilized During Treatment  Gait belt    Activity Tolerance  Patient tolerated treatment well;Patient limited by fatigue    Behavior During Therapy  [facility] for tasks assessed/performed       Past Medical History:  Diagnosis Date  . Alcohol abuse   . APS (antiphospholipid syndrome) ([place])   . BPH (benign prostatic hyperplasia)   . CAD (coronary artery disease)   . Cellulitis   . Cervical spinal stenosis   . Chronic osteomyelitis ([place])   . CKD (chronic kidney disease)   . CKD (chronic kidney disease)   . Clostridium difficile diarrhea   . Depression   . Diplopia   . Fatigue   . GERD (gastroesophageal reflux disease)   . Hyperlipemia   . Hypertension   . Hypovitaminosis D   . IBS (irritable bowel syndrome)   . IgA deficiency ([place])   . Insomnia   . Left lumbosacral radiculopathy   . Moderate obstructive sleep apnea   . Osteomyelitis of foot ([place])   . PAD (peripheral artery disease) ([place])   . Pruritus   . RA (rheumatoid arthritis) ([place])   . Radiculopathy   . Restless legs syndrome   . RLS (restless legs syndrome)   . SLE (systemic lupus erythematosus related syndrome) ([place])   . Stroke ([place])   . Toxic maculopathy of both eyes     Past Surgical History:  Procedure Laterality Date  . CARDIAC  CATHETERIZATION    . CAROTID ENDARTERECTOMY    . CERVICAL LAMINECTOMY    . CORONARY ANGIOPLASTY    . FRACTURE SURGERY    . fusion C5-6-7    . HERNIA REPAIR    . KNEE ARTHROSCOPY    . TONSILLECTOMY      There were no vitals filed for this visit.  Subjective Assessment - 12/19/18 1436    Subjective  Patient reports that he is very tired today.  He reports mild pain in his L hand, but otherwise no pain is reported. No new questions or concerns.    Patient is accompained by:  Family member    Pertinent History  Patient is a 73 y.o. male who presents to outpatient physical therapy with a referral for medical diagnosis of CVA. This patient's chief complaints consist of left hemiplegia and overall deconditioning leading to the following functional deficits: dependent for ADLs, IADLs, unable to transfer to car, dependent transfers at home with hoyer lift, unable to walk or stand without significant assistance, difficulty with W/C navigation..    Limitations  Sitting;Lifting;Standing;House hold activities;Writing;Walking;Other (comment)    Diagnostic tests  Brain MRI 11/01/2017: IMPRESSION: 1. Acute right ACA territory infarct as demonstrated on prior CT imaging. No intracranial hemorrhage or significant  mass effect. 2. 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    Currently in Pain?  No/denies       TREATMENT: Patient transferred squat pivot wheelchair to NuStep with BUE and therapist assistance, modA +1  with 2nd person assisting to guide hips   Following therapeutic activity:  Warm up on Nustep BLE only level 0 for 1 min; level 2 x3 min, and L1x 1 min with cues to increase speed to >40 steps per minute, patient averaged 25-30 steps per minute, HR was 90bpm following exercise;   He requires min-mod verbal cueing to for proper trunk alignment, with a tendency to lean towards his left.  He additionally required cues for re-orientation to task as patient is  heavily distracted  Following therapeutic exercise:  Transferred nustep to wheelchair squat pivot with mod A +2;  Patient required cues for hand placement and positioning. He had increased difficulty pivoting to wheelchair likely due to fatigue;   Patient sitting in power chair with power chair elevated and tilted forward for better positioning: Sit<>stand transfers with RW:   x2 reps with mod-maxA +2  during the first stand with intermittent therapist assist to block left knee; patient able to exhibit good forward trunk lean to stand, but has a tendency to posteriorly weight shift, requiring mod cueing to pull his hips forward and stand up straight;Patient able to stand for ~10-15 sec each repetition;   Seated in WC: x10 L knee extensions with cueing to kick therapists hand at his end range x10 L hip flexion with cueing to touch therapists hand with his thigh x10 B hip abduction against manual resistance  x10 B hip adduction against manual resistance  Patient was very fatigued throughout today's session.  He was able to complete 5 minutes on the NuSTEP today with with LEs at varying resistance levels between L0-L2.  He does require mod verbal cueing throughout session for redirection to limit distractions.  He was able to complete 2 stands today, however demonstrates increased posterior weight shift today, and requires more assistance to come to stand.                             PT Short Term Goals - 12/04/18 1303      PT SHORT TERM GOAL #1   Title  Be independent with initial home exercise program for self-management of symptoms.    Baseline  HEP to be given at second session; advice to practice unsupported sitting at home (06/19/2018); 07/08/18: Performing at home, 6/29: needs assistance but is doing exercise program regularly; 08/22/18: adherent;    Time  2    Period  Weeks    Status  Achieved    Target Date  06/26/18        PT Long Term Goals - 12/04/18  1303      PT LONG TERM GOAL #1   Title  Patient will complete all bed mobility with min A to improve functional independence for getting in and out of bed and adjusting in bed.     Baseline  max A - total A reported by family (06/19/2018); 07/08/18: still requires maxA, dtr reports improvement in rolling since starting therapy; 6/23: min A to supervision in clinic; not doing at home right; 8/5: Pt's daughter states that his rolling has improved, but still has difficulty with all other bed mobility; 10/23/18: pts daughter states that she is doing about 35%  of the work if he is in proper position, he does uses the bed rails to help roll;  using Harrel Lemon for coming to sit EOB; 10/12: pts daughter states that she is doing about 30% of the work if he is in proper position, he does uses the bed rails to help roll, but it is improving;  using Hoyer for coming to sit EOB, 10/29: min A for sit to sidelying, rolling supervision, able to transition sidelying to sit with HHA;    Time  12    Period  Weeks    Status  Achieved      PT LONG TERM GOAL #2   Title  Patient will complete sit <> stand transfer chair to chair with Min A and LRAD to improve functional independence for household and community mobility.     Baseline  max A +2 STS at W/C (06/12/2018); max A +2-3 STS or stand pivot transfer, able to complete more reps (06/19/2018); 07/08/18: not safe to attempt at this time; 09/11/18: requires maxA+1 or modA+2 for STSs and squat pivot transfer; 10/23/18: requires maxA+1 or modA+2 for STSs and squat pivot transfer; 10/12: requires maxA+1 or modA+2 for STSs and squat pivot transfer; 10/29: patient able to complete sit<>Stand transfer min A +1 with parallel bars and RW, although inconsistent, mod A for stand pivot;    Time  12    Period  Weeks    Status  Partially Met    Target Date  02/27/19      PT LONG TERM GOAL #3   Title  Patient will navigate power w/c with min A x 100 feet to improve mobility for household and short  community distances.    Baseline  required total A (06/12/2018); able to roll 20 feet with min A (06/20/2018); 07/08/18: Pt can go approximately 10' without assist per family;  10/23/2018: pt's daughter states that his power WC mobility has become 100% better with the new hand control, slowed speed, and with practice; 10/12: pt's daughter reports that he can perform household WC mobility and limited community ambulation with minA    Time  12    Period  Weeks    Status  Achieved      PT LONG TERM GOAL #4   Title  Patient will complete car transfers W/C to family SUV with min A using LRAD to improve his abilty to participate in community activities.     Baseline  unable to complete car transfers at all (06/12/2018); unable to complete car transfers at all (06/19/2018).; 07/08/18: Unable to perform at this time; 09/11/18: unable to perform at this time; 10/23/2018: unable to complete car transfers at all; 10/12:  unable to complete car transfers at all, 10/29: unable    Time  12    Period  Weeks    Status  On-going    Target Date  02/27/19      PT LONG TERM GOAL #5   Title  Patient will ambulate at least 150 feet with mod I using LRAD to improve mobility for household and community distances.     Baseline  unable to take steps (06/12/2018); able to shift R leg forward and back with max A and RW at edge of plinth (06/19/2018); 07/08/18: unable to ambulate at this time. 09/11/18: unable to perform at this time; 10/23/2018: unable to perform at this time; 10/12: unable to perform at this time, 10/29: unable at this time    Time  12    Period  Weeks    Status  On-going    Target Date  02/27/19      Additional Long Term Goals   Additional Long Term Goals  Yes      PT LONG TERM GOAL #6   Title  Patient will be able to safety navigate up/down ramp with power chair, exhibiting good safety awareness, mod I to safely enter/exit his home.    Baseline  unable to complet stairs (06/12/2018); unable to complete stairs  (06/19/2018); 07/08/18: unable to attempt at this time; 09/11/18: unsafe to attempt at this time; 10/23/2018: unable to complet stairs (06/12/2018); unable to complete stairs (06/19/2018); 07/08/18: unable to attempt at this time; 09/11/18: unsafe to attempt at this time; 10/23/18: unsafe to attempt at this time, 10/29: requires superivison and occasional min A for safety    Time  12    Period  Weeks    Status  Revised    Target Date  02/27/19      PT LONG TERM GOAL #7   Title  Patient will increase functional reach test to >15 inches in sitting to exhibit improved sitting balance and positioning and reduce fall risk;    Baseline  07/30/18: 12 inches; 09/11/18: 10-12 inches; 10/23/18: 12-13 inch; 10/12: 14.5", 10/29: 15.5 inch    Time  4    Period  Weeks    Status  Achieved      PT LONG TERM GOAL #8   Title  Patient will increase LLE quad strength to 3+/5 to improve functional strength for standing and mobility;    Baseline  10/28: 2+/5    Time  12    Period  Weeks    Status  New    Target Date  02/27/19            Plan - 12/19/18 1552    Clinical Impression Statement  Patient was very fatigued throughout today's session.  He was able to complete 5 minutes on the NuSTEP today with with LEs at varying resistance levels between L0-L2.  He does require mod verbal cueing throughout session for redirection to limit distractions.  He was able to complete 2 stands today, however demonstrates increased posterior weight shift today, and requires more assistance to come to stand.    Personal Factors and Comorbidities  Age;Comorbidity 3+    Comorbidities  Relevant past medical history and comorbidities include long term steroid use for lupus, CKD, chronic osteomyelitis, cervical spine stenosis, BPH, APS, alcohol abuse, peripheral artery disease, depression, GERD, obstructive sleep apnea, HTN, IBS, IgA deficiency, lumbar radiculopathy, RA, Stroke, toxic maculopathy in both eyes, systemic lupus erythematosus related  syndrome, cardiac catheterization, carotid endarterectomy, cervical laminectomy, coronary angioplasty, Fusion C5-C7, knee arthroplasty, lumbar surgery.    Examination-Activity Limitations  Bed Mobility;Bend;Sit;Toileting;Stand;Stairs;Lift;Transfers;Squat;Locomotion Level;Carry;Dressing;Hygiene/Grooming;Continence    Examination-Participation Restrictions  Yard Work;Interpersonal Relationship;Community Activity    Stability/Clinical Decision Making  Evolving/Moderate complexity    Rehab Potential  Fair    PT Frequency  3x / week    PT Duration  12 weeks    PT Treatment/Interventions  ADLs/Self Care Home Management;Electrical Stimulation;Moist Heat;Cryotherapy;Gait training;Stair training;Functional mobility training;Therapeutic exercise;Balance training;Neuromuscular re-education;Cognitive remediation;Patient/family education;Orthotic Fit/Training;Wheelchair mobility training;Manual techniques;Passive range of motion;Energy conservation;Joint Manipulations    PT Next Visit Plan  Update goals, progress note; Work on strength, sitting/standing balance, functional activities such as rolling, bed mobility, transfers; caregiver training    PT Home Exercise Plan  handout provided via Beth during previous sessions    Consulted and Agree with Plan of Care  Patient;Family member/caregiver    Family Member Consulted  daughter [name]       Patient will benefit from skilled therapeutic intervention in order to improve the following deficits and impairments:  Abnormal gait, Decreased activity tolerance, Decreased cognition, Decreased endurance, Decreased knowledge of use of DME, Decreased range of motion, Decreased skin integrity, Decreased strength, Impaired perceived functional ability, Impaired sensation, Impaired UE functional use, Improper body mechanics, Pain, Cardiopulmonary status limiting activity, Decreased balance, Decreased coordination, Decreased mobility, Difficulty walking, Impaired tone, Postural  dysfunction  Visit Diagnosis: Muscle weakness (generalized)  Other lack of coordination  Difficulty in walking, not elsewhere classified     Problem List Patient Active Problem List   Diagnosis Date Noted  . Sepsis ([place]) 10/18/2018     Lutricia Horsfall, SPT  Lutricia Horsfall 12/19/2018, 3:53 PM  [place] MAIN [facility] SERVICES [address] [place], [place], [address] Phone: [phone]   Fax:  [phone]  Name: BYREN PANKOW MRN: [id number] Date of Birth: 1945/02/15

## 2018-12-19 NOTE — Therapy (Addendum)
[place] MAIN [facility] SERVICES [address] [place], [place], [address] Phone: [phone]   Fax:  [phone]  Occupational Therapy Treatment  Patient Details  Name: Kenneth Summers MRN: [id number] Date of Birth: 04/16/1945 No data recorded  Encounter Date: 12/19/2018  OT End of Session - 12/19/18 1355    Visit Number  14    Number of Visits  56   Date for OT Re-Evaluation  01/15/19    Authorization Type  Progress report period starting 10/30/2018    OT Start Time  1300    OT Stop Time  1345    OT Time Calculation (min)  45 min    Activity Tolerance  Patient tolerated treatment well    Behavior During Therapy  [facility] for tasks assessed/performed       Past Medical History:  Diagnosis Date  . Alcohol abuse   . APS (antiphospholipid syndrome) ([place])   . BPH (benign prostatic hyperplasia)   . CAD (coronary artery disease)   . Cellulitis   . Cervical spinal stenosis   . Chronic osteomyelitis ([place])   . CKD (chronic kidney disease)   . CKD (chronic kidney disease)   . Clostridium difficile diarrhea   . Depression   . Diplopia   . Fatigue   . GERD (gastroesophageal reflux disease)   . Hyperlipemia   . Hypertension   . Hypovitaminosis D   . IBS (irritable bowel syndrome)   . 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 Insomnia   . Left lumbosacral radiculopathy   . Moderate obstructive sleep apnea   . Osteomyelitis of foot ([place])   . PAD (peripheral artery disease) ([place])   . Pruritus   . RA (rheumatoid arthritis) ([place])   . Radiculopathy   . Restless legs syndrome   . RLS (restless legs syndrome)   . SLE (systemic lupus erythematosus related syndrome) ([place])   . Stroke ([place])   . Toxic maculopathy of both eyes     Past Surgical History:  Procedure Laterality Date  . CARDIAC CATHETERIZATION    . CAROTID ENDARTERECTOMY    . CERVICAL LAMINECTOMY    . CORONARY ANGIOPLASTY    . FRACTURE SURGERY    . fusion C5-6-7    . HERNIA REPAIR    . KNEE  ARTHROSCOPY    . TONSILLECTOMY      There were no vitals filed for this visit.  Subjective Assessment - 12/19/18 1354    Subjective   Pt. reports having had a good weekend    Patient is accompanied by:  Family member    Pertinent History  Pt. is a 73 y.o. 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 Pt. PMHx includes: Multiple Falls, Lupus, DJD, Renal Abscess, CVA. Pt. resides with his wife. Pt.'s wife and daughter assist with ADLs. Pt. has caregivers in  for 2 hours a day, 6 days a week. Pt. received Rehab services in acute care, at SNF for STR, and [place] services. Pt. is retired from [employer] for [employer] and [employer].    Patient Stated Goals  To regain use of his left UE, and do more for himself.    Currently in Pain?  No/denies      OT TREATMENT    Neuro muscular re-education:  Pt. Worked on using his left hand for [facility] skills manipulating nuts, and bolts on a bolt board. Pt. Worked on screwing, and unscrewing nuts, and bolts of varying sizes,  and challenging progressively smaller items with vision occluded.  Selfcare:  Pt. worked on UE dressing tasks using hemi-dressing techniques. Pt. requiredincreasedcues forinitially positioning the sweatshirtwhenpreparing todonn a zip down sweatshirt. Pt. Required cues to thread the LUE with the right, assist to bring the jacket around his back, and reposition the jacket to prepare for zipping. Pt. Had a moe difficult time with the rain slicker outer jacket vs. A sweatshirt jacket.  Response to Treatment:  Pt. required increased cues today for steering his wheelchair from the waiting area to the rehab gym. Pt. required increased time, and more cues today for UE dressing donning a jacket/coat. Pt. required increased cues to engage his left hand while manipulating objects. Pt. continues to work on improving LUE strength, motor control, and coordination in order to improve overall ADL, and IADL  functioning.                       OT Education - 12/19/18 1453    Education Details  LUE functioning    Person(s) Educated  Patient    Methods  Explanation;Demonstration    Comprehension  Returned demonstration;Tactile cues required;Verbalized understanding;Need further instruction          OT Long Term Goals - 10/28/18 1532      OT LONG TERM GOAL #1   Title  Pt. will increase left shoulder flexion AROM by 10 degrees to access cabinet/shelf.    Baseline  Pt. has difficulty reaching for items with the LUE secondary to decreased LUE ROM    Time  12    Period  Weeks    Status  On-going    Target Date  01/15/19      OT LONG TERM GOAL #2   Title  Pt. will donn a shirt with Supervision.    Baseline  Min    Time  12    Period  Weeks    Status  On-going    Target Date  01/15/19      OT LONG TERM GOAL #3   Title  Pt. will require ModA to perform LE dressing    Baseline  MaxA left, ModA with the right    Time  12    Period  Weeks    Status  On-going    Target Date  01/15/19      OT LONG TERM GOAL #4   Title  Pt. will improve left grip strength by 10# to be able to open a jar/container.    Baseline  Decreased grip since hospital readmission, pt. has difficulty opening containers    Time  12    Period  Weeks    Status  On-going    Target Date  01/15/19      OT LONG TERM GOAL #5   Title  Pt. will improve left hand [facility] skills to be able to assit with buttoning/zipping.    Baseline  Pt. has difficulty [facility] skills buttoning/zipping    Time  12    Period  Weeks    Status  On-going    Target Date  01/15/19      OT LONG TERM GOAL #6   Title  Pt. will demonstrate visual conmpensatory strategies during 100% of the time during ADLs    Baseline  Pt. requires cues for left sides awareness.    Time  12    Period  Weeks    Status  On-going    Target Date  01/15/19  OT LONG TERM GOAL #7   Title  Pt. will prepare a simple cold snack from w/c with  supervision using cognitive compensatory strategies 100% of the time.    Baseline  Pt. is unable    Time  12    Period  Weeks    Target Date  11/27/18      OT LONG TERM GOAL #8   Title  Pt. will accurately identify potential safety hazard using good safety awareness, and judgement 100% for ADLs, and IADLs.    Baseline  Limited    Time  12    Period  Weeks    Status  On-going    Target Date  11/27/18      OT LONG TERM GOAL  #9   TITLE  Pt. will  navigate his w/c around obstacles with Supervision and 100% accuracy    Baseline  Pt. requires cuing for obstacle on the left    Time  12    Period  Weeks    Status  On-going    Target Date  11/27/18            Plan - 12/19/18 1355    Clinical Impression Statement Pt. required increased cues today for steering his wheelchair from the waiting area to the rehab gym. Pt. required increased time, and more cues today for UE dressing donning a jacket/coat. Pt. required increased cues to engage his left hand while manipulating objects..Pt. continues to work on improving LUE strength, motor control, and coordination in order to improve overall ADL, and IADL functioning.    OT Occupational Profile and History  Problem Focused Assessment - Including review of records relating to presenting problem    Occupational performance deficits (Please refer to evaluation for details):  ADL's    Body Structure / Function / Physical Skills  UE functional use;Coordination;FMC;Dexterity;Strength;ROM    Cognitive Skills  Attention;Memory;Emotional;Problem Solve;Safety Awareness    Rehab Potential  Good    Clinical Decision Making  Several treatment options, min-mod task modification necessary    Comorbidities Affecting Occupational Performance:  Presence of comorbidities impacting occupational performance    Comorbidities impacting occupational performance description:  Phyical, cognitive, visual,  medical comorbidities    Modification or Assistance to Complete  Evaluation   No modification of tasks or assist necessary to complete eval    OT Frequency  3x / week    OT Duration  12 weeks    OT Treatment/Interventions  Self-care/ADL training;DME and/or AE instruction;Therapeutic exercise;Therapeutic activities;Moist Heat;Cognitive remediation/compensation;Neuromuscular education;Visual/perceptual remediation/compensation;Coping strategies training;Patient/family education;Passive range of motion;Psychosocial skills training;Energy conservation;Functional Mobility Training    Recommended Other Services  PT    Consulted and Agree with Plan of Care  Patient    Family Member Consulted  Daughter [name]       Patient will benefit from skilled therapeutic intervention in order to improve the following deficits and impairments:   Body Structure / Function / Physical Skills: UE functional use, Coordination, FMC, Dexterity, Strength, ROM Cognitive Skills: Attention, Memory, Emotional, Problem Solve, Safety Awareness     Visit Diagnosis: Muscle weakness (generalized)  Other lack of coordination    Problem List Patient Active Problem List   Diagnosis Date Noted  . Sepsis ([place]) 10/18/2018    Harrel Carina, MS, OTR/L 12/19/2018, 3:05 PM  [place] MAIN [facility] SERVICES [address] [place], [place], [address] Phone: [phone]   Fax:  [phone]  Name: OAK BONFANTE MRN: [id number] Date of Birth: [birth year]

## 2018-12-23 ENCOUNTER — Encounter: Payer: Self-pay | Admitting: Physical Therapy

## 2018-12-23 ENCOUNTER — Ambulatory Visit: Payer: Medicare Other | Admitting: Occupational Therapy

## 2018-12-23 ENCOUNTER — Ambulatory Visit: Payer: Medicare Other | Admitting: Physical Therapy

## 2018-12-23 ENCOUNTER — Other Ambulatory Visit: Payer: Self-pay

## 2018-12-23 ENCOUNTER — Encounter: Payer: Self-pay | Admitting: Occupational Therapy

## 2018-12-23 DIAGNOSIS — R278 Other lack of coordination: Secondary | ICD-10-CM

## 2018-12-23 DIAGNOSIS — M6281 Muscle weakness (generalized): Secondary | ICD-10-CM | POA: Diagnosis not present

## 2018-12-23 DIAGNOSIS — R262 Difficulty in walking, not elsewhere classified: Secondary | ICD-10-CM

## 2018-12-23 NOTE — Therapy (Addendum)
[place] MAIN [facility] SERVICES [address] [place], [place], [address] Phone: [phone]   Fax:  [phone]  Occupational Therapy Treatment  Patient Details  Name: Kenneth Summers MRN: [id number] Date of Birth: 05/30/1945 No data recorded  Encounter Date: 12/23/2018  OT End of Session - 12/23/18 1609    Visit Number  10    Number of Visits  42   Date for OT Re-Evaluation  01/15/19    Authorization Type  Progress report period starting 10/30/2018    OT Start Time  1352    OT Stop Time  1430    OT Time Calculation (min)  38 min    Activity Tolerance  Patient tolerated treatment well    Behavior During Therapy  [facility] for tasks assessed/performed       Past Medical History:  Diagnosis Date  . Alcohol abuse   . APS (antiphospholipid syndrome) ([place])   . BPH (benign prostatic hyperplasia)   . CAD (coronary artery disease)   . Cellulitis   . Cervical spinal stenosis   . Chronic osteomyelitis ([place])   . CKD (chronic kidney disease)   . CKD (chronic kidney disease)   . Clostridium difficile diarrhea   . Depression   . Diplopia   . Fatigue   . GERD (gastroesophageal reflux disease)   . Hyperlipemia   . Hypertension   . Hypovitaminosis D   . IBS (irritable bowel syndrome)   . 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 Insomnia   . Left lumbosacral radiculopathy   . Moderate obstructive sleep apnea   . Osteomyelitis of foot ([place])   . PAD (peripheral artery disease) ([place])   . Pruritus   . RA (rheumatoid arthritis) ([place])   . Radiculopathy   . Restless legs syndrome   . RLS (restless legs syndrome)   . SLE (systemic lupus erythematosus related syndrome) ([place])   . Stroke ([place])   . Toxic maculopathy of both eyes     Past Surgical History:  Procedure Laterality Date  . CARDIAC CATHETERIZATION    . CAROTID ENDARTERECTOMY    . CERVICAL LAMINECTOMY    . CORONARY ANGIOPLASTY    . FRACTURE SURGERY    . fusion C5-6-7    . HERNIA REPAIR    . KNEE  ARTHROSCOPY    . TONSILLECTOMY      There were no vitals filed for this visit.  Subjective Assessment - 12/23/18 1608    Subjective   Pt. reports that he had a nice weekend    Patient is accompanied by:  Family member    Pertinent History  Pt. is a 73 y.o. 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 Pt. PMHx includes: Multiple Falls, Lupus, DJD, Renal Abscess, CVA. Pt. resides with his wife. Pt.'s wife and daughter assist with ADLs. Pt. has caregivers in  for 2 hours a day, 6 days a week. Pt. received Rehab services in acute care, at SNF for STR, and [place] services. Pt. is retired from [employer] for [employer] and [employer].    Patient Stated Goals  To regain use of his left UE, and do more for himself.    Currently in Pain?  No/denies       OT TREATMENT    TherapeuticActivities:  Pt. worked on Administrator, arts patterns using the [employer] patterns on the [employer]. Pt. Worked on using his left hand to grasp the 1 &1/2" pegs. Pt.  Required verbal, and visual cues for following the design.Pt. Required visual, and verbal cues.  There. Ex.:  Pt. worked on using 1.5# dowel with his bilateral UEs reaching forward, and UEs, and trunk flexion towards targets in various planes.   Selfcare:  Pt. worked on UE dressing tasks using hemi-dressing techniques. Pt. requiredincreasedcues forinitially positioning the sweatshirtwhenpreparing todonn a zip down sweatshirt. Pt. Required cues to thread the LUE with the right, assist to bring the jacket around his back, and reposition the jacket to prepare for zipping.    Response to Treatment   Pt. continues to require cues, and assist to initiate donning,a nd threading his left arm into the sleeeve, and pt. initially attempts to donn the to sweatshirt in a backwards position.Pt. required verbal cues to use his left hand for grasping items, and visual/verbal cues to follow the design pattern.  Pt. continues to present with limited left sided awareness, and LUE functioning.  Pt. continues to work on improving LUE awareness, Left UE strength, and [facility] skills in order to improve LUE functioning in order to improve LUE engagement during ADLs, and IADLs, maximize independence and decrease caregiver burden.                OT Education - 12/23/18 1609    Education Details  LUE functioning    Person(s) Educated  Patient    Methods  Explanation;Demonstration    Comprehension  Returned demonstration;Tactile cues required;Verbalized understanding;Need further instruction          OT Long Term Goals - 10/28/18 1532      OT LONG TERM GOAL #1   Title  Pt. will increase left shoulder flexion AROM by 10 degrees to access cabinet/shelf.    Baseline  Pt. has difficulty reaching for items with the LUE secondary to decreased LUE ROM    Time  12    Period  Weeks    Status  On-going    Target Date  01/15/19      OT LONG TERM GOAL #2   Title  Pt. will donn a shirt with Supervision.    Baseline  Min    Time  12    Period  Weeks    Status  On-going    Target Date  01/15/19      OT LONG TERM GOAL #3   Title  Pt. will require ModA to perform LE dressing    Baseline  MaxA left, ModA with the right    Time  12    Period  Weeks    Status  On-going    Target Date  01/15/19      OT LONG TERM GOAL #4   Title  Pt. will improve left grip strength by 10# to be able to open a jar/container.    Baseline  Decreased grip since hospital readmission, pt. has difficulty opening containers    Time  12    Period  Weeks    Status  On-going    Target Date  01/15/19      OT LONG TERM GOAL #5   Title  Pt. will improve left hand [facility] skills to be able to assit with buttoning/zipping.    Baseline  Pt. has difficulty [facility] skills buttoning/zipping    Time  12    Period  Weeks    Status  On-going    Target Date  01/15/19      OT LONG TERM GOAL #6   Title  Pt. will demonstrate visual  conmpensatory strategies during 100% of the time during ADLs    Baseline  Pt. requires cues for left sides awareness.    Time  12    Period  Weeks    Status  On-going    Target Date  01/15/19      OT LONG TERM GOAL #7   Title  Pt. will prepare a simple cold snack from w/c with supervision using cognitive compensatory strategies 100% of the time.    Baseline  Pt. is unable    Time  12    Period  Weeks    Target Date  11/27/18      OT LONG TERM GOAL #8   Title  Pt. will accurately identify potential safety hazard using good safety awareness, and judgement 100% for ADLs, and IADLs.    Baseline  Limited    Time  12    Period  Weeks    Status  On-going    Target Date  11/27/18      OT LONG TERM GOAL  #9   TITLE  Pt. will  navigate his w/c around obstacles with Supervision and 100% accuracy    Baseline  Pt. requires cuing for obstacle on the left    Time  12    Period  Weeks    Status  On-going    Target Date  11/27/18            Plan - 12/23/18 1609    Clinical Impression Statement  Pt. continues to require cues, and assist to initiate donning,a nd threading his left arm into the sleeeve, and pt. initially attempts to donn the to sweatshirt in a backwards position.Pt. required verbal cues to use his left hand for grasping items, and visual/verbal cues to follow the design pattern. Pt. continues to present with limited left sided awareness, and LUE functioning.  Pt. continues to work on improving LUE awareness, Left UE strength, and [facility] skills in order to improve LUE functioning in order to improve LUE engagement during ADLs, and IADLs, maximize independence and decrease caregiver burden.    OT Occupational Profile and History  Problem Focused Assessment - Including review of records relating to presenting problem    Occupational performance deficits (Please refer to evaluation for details):  ADL's    Body Structure / Function / Physical Skills  UE functional  use;Coordination;FMC;Dexterity;Strength;ROM    Cognitive Skills  Attention;Memory;Emotional;Problem Solve;Safety Awareness    Rehab Potential  Good    Clinical Decision Making  Several treatment options, min-mod task modification necessary    Comorbidities Affecting Occupational Performance:  Presence of comorbidities impacting occupational performance    Comorbidities impacting occupational performance description:  Phyical, cognitive, visual,  medical comorbidities    Modification or Assistance to Complete Evaluation   No modification of tasks or assist necessary to complete eval    OT Frequency  3x / week    OT Duration  12 weeks    OT Treatment/Interventions  Self-care/ADL training;DME and/or AE instruction;Therapeutic exercise;Therapeutic activities;Moist Heat;Cognitive remediation/compensation;Neuromuscular education;Visual/perceptual remediation/compensation;Coping strategies training;Patient/family education;Passive range of motion;Psychosocial skills training;Energy conservation;Functional Mobility Training    Recommended Other Services  PT    Consulted and Agree with Plan of Care  Patient    Family Member Consulted  Daughter [name]       Patient will benefit from skilled therapeutic intervention in order to improve the following deficits and impairments:   Body Structure / Function / Physical Skills: UE functional use, Coordination, FMC, Dexterity, Strength, ROM Cognitive  Skills: Attention, Memory, Emotional, Problem Solve, Safety Awareness     Visit Diagnosis: Muscle weakness (generalized)  Other lack of coordination    Problem List Patient Active Problem List   Diagnosis Date Noted  . Sepsis ([place]) 10/18/2018    Harrel Carina, MS, OTR/L 12/23/2018, 4:19 PM  [place] MAIN [facility] SERVICES [address] [place], [place], [address] Phone: [phone]   Fax:  [phone]  Name: Kenneth Summers MRN: [id number] Date of Birth:  December 10, 1945

## 2018-12-23 NOTE — Therapy (Signed)
[place] [place] [facility] MA[facility] SERVICES [address] [place], [place], [address] Phone: [phone]   Fax:  [phone]  Physical Therapy Treatment  Patient Details  Name: Kenneth Summers MRN: [id number] Date of Birth: [birth year] Referring Provider (PT): Dr. [name], Guy Begin   Encounter Date: 12/23/2018  PT End of Session - 12/23/18 1256    Visit Number  [id number]    Number of Visits  108    Date for PT Re-Evaluation  02/27/19    Authorization Type  Medicare reporting period starting 09/11/18    Authorization Time Period  goals last assessed 12/04/18    PT Start Time  1302    PT Stop Time  1345    PT Time Calculation (min)  43 min    Equipment Utilized During Treatment  Gait belt    Activity Tolerance  Patient tolerated treatment well;Patient limited by fatigue    Behavior During Therapy  [facility] for tasks assessed/performed       Past Medical History:  Diagnosis Date  . Alcohol abuse   . APS (antiphospholipid syndrome) ([place])   . BPH (benign prostatic hyperplasia)   . CAD (coronary artery disease)   . Cellulitis   . Cervical spinal stenosis   . Chronic osteomyelitis ([place])   . CKD (chronic kidney disease)   . CKD (chronic kidney disease)   . Clostridium difficile diarrhea   . Depression   . Diplopia   . Fatigue   . GERD (gastroesophageal reflux disease)   . Hyperlipemia   . Hypertension   . Hypovitaminosis D   . IBS (irritable bowel syndrome)   . 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 Insomnia   . Left lumbosacral radiculopathy   . Moderate obstructive sleep apnea   . Osteomyelitis of foot ([place])   . PAD (peripheral artery disease) ([place])   . Pruritus   . RA (rheumatoid arthritis) ([place])   . Radiculopathy   . Restless legs syndrome   . RLS (restless legs syndrome)   . SLE (systemic lupus erythematosus related syndrome) ([place])   . Stroke ([place])   . Toxic maculopathy of both eyes     Past Surgical History:  Procedure Laterality Date  . CARDIAC  CATHETERIZATION    . CAROTID ENDARTERECTOMY    . CERVICAL LAMINECTOMY    . CORONARY ANGIOPLASTY    . FRACTURE SURGERY    . fusion C5-6-7    . HERNIA REPAIR    . KNEE ARTHROSCOPY    . TONSILLECTOMY      There were no vitals filed for this visit.  Subjective Assessment - 12/24/18 0901    Subjective  Pt reports feeling good today. Denies any pain, reports minimal fatigue.    Patient is accompained by:  Family member    Pertinent History  Patient is a 73 y.o. male who presents to outpatient physical therapy with a referral for medical diagnosis of CVA. This patient's chief complaints consist of left hemiplegia and overall deconditioning leading to the following functional deficits: dependent for ADLs, IADLs, unable to transfer to car, dependent transfers at home with hoyer lift, unable to walk or stand without significant assistance, difficulty with W/C navigation..    Limitations  Sitting;Lifting;Standing;House hold activities;Writing;Walking;Other (comment)    Diagnostic tests  Brain MRI 11/01/2017: IMPRESSION: 1. Acute right ACA territory infarct as demonstrated on prior CT imaging. No intracranial hemorrhage or significant mass effect. 2. 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  Currently in Pain?  No/denies    Multiple Pain Sites  No            TREATMENT: Patient transferred squat pivot wheelchair<>nustep with BUE and therapist assistance, min A +1 with daughter providing HHA as needed;    Following therapeutic activity:  Instructed patient in Nustep, seat #15, arms #14 Pt instructed in BUE/BLE level 2x 2 min with cues to increase speed as fast as possible, increasing steps per minute to >70, Then reduced to BLE only level 2 x33mn with cues to increase speed to >50 steps per minute, patient averaged 35-45 steps per minute, HR was 96 bpm following exercise;    Following therapeutic exercise:  When patient turned in Nustep to prepare to  transfer he complained of a vertigo type symptom with room spinning. Vitals assessed, BP 126/72; Pt rested for 2-3 min and vertigo resolved; Denies any other symptoms; Transferred nustep to wheelchair squat pivot with mod A +2 with daughter assisting for safety;  Patient required cues for hand placement and positioning. He had increased difficulty pivoting to wheelchair likely due to fatigue;   Patient is improving in LE AROM being able to place LLE foot on leg rest with cues and increased time;    Patient sitting in power chair with power chair elevated and tilted forward for better positioning: Sit<>stand transfers with RW:   x2 reps with min A +1 with intermittent therapist assist to block left knee; patient able to exhibit good forward trunk lean but does require mod VCs for proper positioning during mid transfer; Upon standing, patient able to exhibit improved hip/knee extension being able to progress to CGA/supervision for balance; However he exhibits increased posterior lean upon standing requiring mod VCs and tactile cues to pull self forward and shift forward for better shift to midline and improved stance control;  Patient able to stand for 10-30 sec each repetition; He required mod VCs to reach back for arm rest of chair to control sit down with good positioning and safety awareness;   Following therapeutic activity: Exercise: Seated in power chair: -hip flexion AROM x10 reps; -LAQ x10 reps with cues to increase knee flexion upon rest especially on left side -LLE hip abduction with flexion for bring leg out to side and then back to midline x10 reps with AAROM and mod VCs and tactile cues to increase ROM; -LLE hip adduction towel squeezes x5 reps with increased time and significant difficulty isolating LLE;  Patient instructed to increase HEP with hip adduction towel squeezes for better hip adductor activation;    Patient tolerated session well. He does report increased fatigue at end of  session. Patient responds well to cues with better positioning and muscle activation; Patient able to exhibit improved trunk control and less lateral lean following treatment session indicating improved weight shift to midline.                     PT Education - 12/23/18 1255    Education Details  LE strengthening, transfers, HEP    Person(s) Educated  Patient;Child(ren)    Methods  Explanation;Demonstration;Verbal cues    Comprehension  Verbalized understanding;Returned demonstration;Verbal cues required;Need further instruction       PT Short Term Goals - 12/04/18 1303      PT SHORT TERM GOAL #1   Title  Be independent with initial home exercise program for self-management of symptoms.    Baseline  HEP to be given at second session; advice to practice  unsupported sitting at home (06/19/2018); 07/08/18: Performing at home, 6/29: needs assistance but is doing exercise program regularly; 08/22/18: adherent;    Time  2    Period  Weeks    Status  Achieved    Target Date  06/26/18        PT Long Term Goals - 12/04/18 1303      PT LONG TERM GOAL #1   Title  Patient will complete all bed mobility with min A to improve functional independence for getting in and out of bed and adjusting in bed.     Baseline  max A - total A reported by family (06/19/2018); 07/08/18: still requires maxA, dtr reports improvement in rolling since starting therapy; 6/23: min A to supervision in clinic; not doing at home right; 8/5: Pt's daughter states that his rolling has improved, but still has difficulty with all other bed mobility; 10/23/18: pts daughter states that she is doing about 35% of the work if he is in proper position, he does uses the bed rails to help roll;  using Hoyer for coming to sit EOB; 10/12: pts daughter states that she is doing about 30% of the work if he is in proper position, he does uses the bed rails to help roll, but it is improving;  using Hoyer for coming to sit EOB, 10/29:  min A for sit to sidelying, rolling supervision, able to transition sidelying to sit with HHA;    Time  12    Period  Weeks    Status  Achieved      PT LONG TERM GOAL #2   Title  Patient will complete sit <> stand transfer chair to chair with Min A and LRAD to improve functional independence for household and community mobility.     Baseline  max A +2 STS at W/C (06/12/2018); max A +2-3 STS or stand pivot transfer, able to complete more reps (06/19/2018); 07/08/18: not safe to attempt at this time; 09/11/18: requires maxA+1 or modA+2 for STSs and squat pivot transfer; 10/23/18: requires maxA+1 or modA+2 for STSs and squat pivot transfer; 10/12: requires maxA+1 or modA+2 for STSs and squat pivot transfer; 10/29: patient able to complete sit<>Stand transfer min A +1 with parallel bars and RW, although inconsistent, mod A for stand pivot;    Time  12    Period  Weeks    Status  Partially Met    Target Date  02/27/19      PT LONG TERM GOAL #3   Title  Patient will navigate power w/c with min A x 100 feet to improve mobility for household and short community distances.    Baseline  required total A (06/12/2018); able to roll 20 feet with min A (06/20/2018); 07/08/18: Pt can go approximately 10' without assist per family;  10/23/2018: pt's daughter states that his power WC mobility has become 100% better with the new hand control, slowed speed, and with practice; 10/12: pt's daughter reports that he can perform household WC mobility and limited community ambulation with minA    Time  12    Period  Weeks    Status  Achieved      PT LONG TERM GOAL #4   Title  Patient will complete car transfers W/C to family SUV with min A using LRAD to improve his abilty to participate in community activities.     Baseline  unable to complete car transfers at all (06/12/2018); unable to complete car transfers at all (06/19/2018).;  07/08/18: Unable to perform at this time; 09/11/18: unable to perform at this time; 10/23/2018: unable to  complete car transfers at all; 10/12:  unable to complete car transfers at all, 10/29: unable    Time  12    Period  Weeks    Status  On-going    Target Date  02/27/19      PT LONG TERM GOAL #5   Title  Patient will ambulate at least 150 feet with mod I using LRAD to improve mobility for household and community distances.     Baseline  unable to take steps (06/12/2018); able to shift R leg forward and back with max A and RW at edge of plinth (06/19/2018); 07/08/18: unable to ambulate at this time. 09/11/18: unable to perform at this time; 10/23/2018: unable to perform at this time; 10/12: unable to perform at this time, 10/29: unable at this time    Time  12    Period  Weeks    Status  On-going    Target Date  02/27/19      Additional Long Term Goals   Additional Long Term Goals  Yes      PT LONG TERM GOAL #6   Title  Patient will be able to safety navigate up/down ramp with power chair, exhibiting good safety awareness, mod I to safely enter/exit his home.    Baseline  unable to complet stairs (06/12/2018); unable to complete stairs (06/19/2018); 07/08/18: unable to attempt at this time; 09/11/18: unsafe to attempt at this time; 10/23/2018: unable to complet stairs (06/12/2018); unable to complete stairs (06/19/2018); 07/08/18: unable to attempt at this time; 09/11/18: unsafe to attempt at this time; 10/23/18: unsafe to attempt at this time, 10/29: requires superivison and occasional min A for safety    Time  12    Period  Weeks    Status  Revised    Target Date  02/27/19      PT LONG TERM GOAL #7   Title  Patient will increase functional reach test to >15 inches in sitting to exhibit improved sitting balance and positioning and reduce fall risk;    Baseline  07/30/18: 12 inches; 09/11/18: 10-12 inches; 10/23/18: 12-13 inch; 10/12: 14.5", 10/29: 15.5 inch    Time  4    Period  Weeks    Status  Achieved      PT LONG TERM GOAL #8   Title  Patient will increase LLE quad strength to 3+/5 to improve functional  strength for standing and mobility;    Baseline  10/28: 2+/5    Time  12    Period  Weeks    Status  New    Target Date  02/27/19            Plan - 12/24/18 0916    Clinical Impression Statement  Patient tolerated session well. He was able to exhibit improved speed and duration on Nustep with better LE muscle activation. Patient did experience episode of vertigo when turning around on Nustep requiring short seated rest break and vital monitoring for safety; This quickly passed. Patient is progressing fair with transfers with improved forward weight shift at start of transfer but then upon standing exhibits increased posterior trunk lean. Patient fatigues quickly. Advanced LE strengthening with cues for increased ROM and repetitions. Patient is progressing in LLE strength with improved AROM noted. He does fatigue quickly. He would benefit from additional skilled PT intervention to improve strength, balance and gait safety;  Personal Factors and Comorbidities  Age;Comorbidity 3+    Comorbidities  Relevant past medical history and comorbidities include long term steroid use for lupus, CKD, chronic osteomyelitis, cervical spine stenosis, BPH, APS, alcohol abuse, peripheral artery disease, depression, GERD, obstructive sleep apnea, HTN, IBS, IgA deficiency, lumbar radiculopathy, RA, Stroke, toxic maculopathy in both eyes, systemic lupus erythematosus related syndrome, cardiac catheterization, carotid endarterectomy, cervical laminectomy, coronary angioplasty, Fusion C5-C7, knee arthroplasty, lumbar surgery.    Examination-Activity Limitations  Bed Mobility;Bend;Sit;Toileting;Stand;Stairs;Lift;Transfers;Squat;Locomotion Level;Carry;Dressing;Hygiene/Grooming;Continence    Examination-Participation Restrictions  Yard Work;Interpersonal Relationship;Community Activity    Stability/Clinical Decision Making  Evolving/Moderate complexity    Rehab Potential  Fair    PT Frequency  3x / week    PT Duration   12 weeks    PT Treatment/Interventions  ADLs/Self Care Home Management;Electrical Stimulation;Moist Heat;Cryotherapy;Gait training;Stair training;Functional mobility training;Therapeutic exercise;Balance training;Neuromuscular re-education;Cognitive remediation;Patient/family education;Orthotic Fit/Training;Wheelchair mobility training;Manual techniques;Passive range of motion;Energy conservation;Joint Manipulations    PT Next Visit Plan  Update goals, progress note; Work on strength, sitting/standing balance, functional activities such as rolling, bed mobility, transfers; caregiver training    PT Home Exercise Plan  handout provided via Beth during previous sessions    Consulted and Agree with Plan of Care  Patient;Family member/caregiver    Family Member Consulted  daughter [name]       Patient will benefit from skilled therapeutic intervention in order to improve the following deficits and impairments:  Abnormal gait, Decreased activity tolerance, Decreased cognition, Decreased endurance, Decreased knowledge of use of DME, Decreased range of motion, Decreased skin integrity, Decreased strength, Impaired perceived functional ability, Impaired sensation, Impaired UE functional use, Improper body mechanics, Pain, Cardiopulmonary status limiting activity, Decreased balance, Decreased coordination, Decreased mobility, Difficulty walking, Impaired tone, Postural dysfunction  Visit Diagnosis: Muscle weakness (generalized)  Other lack of coordination  Difficulty in walking, not elsewhere classified     Problem List Patient Active Problem List   Diagnosis Date Noted  . Sepsis ([place]) 10/18/2018    Cait Locust PT, DPT 12/24/2018, 9:20 AM  [facility] MAIN [facility] SERVICES [address] [place], [place], [address] Phone: [phone]   Fax:  [phone]  Name: LUIZ TRUMPOWER MRN: [id number] Date of Birth: 09/08/1945

## 2018-12-25 ENCOUNTER — Encounter: Payer: Self-pay | Admitting: Occupational Therapy

## 2018-12-25 ENCOUNTER — Encounter: Payer: Self-pay | Admitting: Physical Therapy

## 2018-12-25 ENCOUNTER — Ambulatory Visit: Payer: Medicare Other | Admitting: Occupational Therapy

## 2018-12-25 ENCOUNTER — Ambulatory Visit: Payer: Medicare Other | Admitting: Physical Therapy

## 2018-12-25 ENCOUNTER — Other Ambulatory Visit: Payer: Self-pay

## 2018-12-25 DIAGNOSIS — M6281 Muscle weakness (generalized): Secondary | ICD-10-CM

## 2018-12-25 DIAGNOSIS — R278 Other lack of coordination: Secondary | ICD-10-CM

## 2018-12-25 DIAGNOSIS — R262 Difficulty in walking, not elsewhere classified: Secondary | ICD-10-CM

## 2018-12-25 NOTE — Therapy (Signed)
[name] MAIN [facility] SERVICES [address] [place], [place], [address] Phone: [phone]   Fax:  [phone]  Physical Therapy Treatment  Patient Details  Name: Kenneth Summers MRN: [id number] Date of Birth: 01/14/1946 Referring Provider (PT): Dr. [name], Guy Begin   Encounter Date: 12/25/2018  PT End of Session - 12/25/18 1312    Visit Number  [id number]    Number of Visits  108    Date for PT Re-Evaluation  02/27/19    Authorization Type  Medicare reporting period starting 09/11/18    Authorization Time Period  goals last assessed 12/04/18    PT Start Time  1302    PT Stop Time  1345    PT Time Calculation (min)  43 min    Equipment Utilized During Treatment  Gait belt    Activity Tolerance  Patient tolerated treatment well;Patient limited by fatigue    Behavior During Therapy  [facility] for tasks assessed/performed       Past Medical History:  Diagnosis Date  . Alcohol abuse   . APS (antiphospholipid syndrome) ([place])   . BPH (benign prostatic hyperplasia)   . CAD (coronary artery disease)   . Cellulitis   . Cervical spinal stenosis   . Chronic osteomyelitis ([place])   . CKD (chronic kidney disease)   . CKD (chronic kidney disease)   . Clostridium difficile diarrhea   . Depression   . Diplopia   . Fatigue   . GERD (gastroesophageal reflux disease)   . Hyperlipemia   . Hypertension   . Hypovitaminosis D   . IBS (irritable bowel syndrome)   . 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 Insomnia   . Left lumbosacral radiculopathy   . Moderate obstructive sleep apnea   . Osteomyelitis of foot ([place])   . PAD (peripheral artery disease) ([place])   . Pruritus   . RA (rheumatoid arthritis) ([place])   . Radiculopathy   . Restless legs syndrome   . RLS (restless legs syndrome)   . SLE (systemic lupus erythematosus related syndrome) ([place])   . Stroke ([place])   . Toxic maculopathy of both eyes     Past Surgical History:  Procedure Laterality Date  . CARDIAC  CATHETERIZATION    . CAROTID ENDARTERECTOMY    . CERVICAL LAMINECTOMY    . CORONARY ANGIOPLASTY    . FRACTURE SURGERY    . fusion C5-6-7    . HERNIA REPAIR    . KNEE ARTHROSCOPY    . TONSILLECTOMY      There were no vitals filed for this visit.  Subjective Assessment - 12/25/18 1312    Subjective  Pt reports feeling good today. Denies any pain, reports minimal fatigue; Daughter reports patient had a good morning.    Patient is accompained by:  Family member    Pertinent History  Patient is a 73 y.o. male who presents to outpatient physical therapy with a referral for medical diagnosis of CVA. This patient's chief complaints consist of left hemiplegia and overall deconditioning leading to the following functional deficits: dependent for ADLs, IADLs, unable to transfer to car, dependent transfers at home with hoyer lift, unable to walk or stand without significant assistance, difficulty with W/C navigation..    Limitations  Sitting;Lifting;Standing;House hold activities;Writing;Walking;Other (comment)    Diagnostic tests  Brain MRI 11/01/2017: IMPRESSION: 1. Acute right ACA territory infarct as demonstrated on prior CT imaging. No intracranial hemorrhage or significant mass effect. 2. 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    Currently in Pain?  No/denies    Multiple Pain Sites  No            TREATMENT: Patient transferred squat pivot wheelchair<>nustep with BUE and therapist assistance, min A +1 with daughter providing HHA as needed;  Following therapeutic activity: Instructed patient in Nustep, seat #15, arms #14 Pt instructed in BUE/BLE level 2x 3 min with cues to increase speed as fast as possible, increasing steps per minute to >70, Then reduced to BLE only level 2 x74mn with cues to increase speed to >50 steps per minute, patient averaged 40-50 steps per minute, HR was 108 bpm following exercise;    Following therapeutic  exercise: Transferred nustep to wheelchair squat pivotwith mod A +2 with daughter assisting for safety; Patient required cues for hand placement and positioning. He had increased difficulty pivoting to wheelchair likely due to fatigue;  Patient is improving in LE AROM being able to place LLE foot on leg rest with cues and increased time;   Patient sitting in power chair with power chair elevated and tilted forward for better positioning: Sit<>stand transfers inside parallel bars:  x5 reps; requires min A +1 for most transfers with mod A +1 for last transfer due to fatigue. Upon standing patient exhibits increased posterior lean requiring mod VCs to "pull self forward with UE and shift weight forward" for better stance control; Therapist initially providing min A in standing and reduced assistance to CGA for patient to feel posterior lean with cues for correction; As therapist reduces assist, patient fatigues quickly often sitting back down in power chair.  He also required cues upon standing to increase hip/knee extension and to straighten arms;  With increased repetition patient expressed increased fatigue in BUE PT asked patient what exercises he is doing for UE strengthening, which patient reported he is not.  PT educated patient in seated tricep press to improve UE strength with lifting weight in chair. Patient verbalized/demonstrated understanding;  At end of session PT discussed with patient/OT the importance of UE strengthening to improve functional mobility and reduce fatigue;      Patient tolerated session well. He does report increased fatigue at end of session. Patient responds well to cues with better positioning and muscle activation;Patient able to exhibit improved trunk control with improved posture sitting in power chair at end of session; He denies any pain;  He is also exhibiting improved LLE knee extension in standing this session compared to previous sessions indicating  improved muscle activation and better strength;                      PT Education - 12/25/18 1312    Education Details  LE strengthening, transfers, HEP    Person(s) Educated  Patient;Child(ren)    Methods  Explanation;Verbal cues    Comprehension  Verbalized understanding;Returned demonstration;Verbal cues required;Need further instruction       PT Short Term Goals - 12/04/18 1303      PT SHORT TERM GOAL #1   Title  Be independent with initial home exercise program for self-management of symptoms.    Baseline  HEP to be given at second session; advice to practice unsupported sitting at home (06/19/2018); 07/08/18: Performing at home, 6/29: needs assistance but is doing exercise program regularly; 08/22/18: adherent;    Time  2    Period  Weeks    Status  Achieved    Target Date  06/26/18  PT Long Term Goals - 12/04/18 1303      PT LONG TERM GOAL #1   Title  Patient will complete all bed mobility with min A to improve functional independence for getting in and out of bed and adjusting in bed.     Baseline  max A - total A reported by family (06/19/2018); 07/08/18: still requires maxA, dtr reports improvement in rolling since starting therapy; 6/23: min A to supervision in clinic; not doing at home right; 8/5: Pt's daughter states that his rolling has improved, but still has difficulty with all other bed mobility; 10/23/18: pts daughter states that she is doing about 35% of the work if he is in proper position, he does uses the bed rails to help roll;  using Hoyer for coming to sit EOB; 10/12: pts daughter states that she is doing about 30% of the work if he is in proper position, he does uses the bed rails to help roll, but it is improving;  using Hoyer for coming to sit EOB, 10/29: min A for sit to sidelying, rolling supervision, able to transition sidelying to sit with HHA;    Time  12    Period  Weeks    Status  Achieved      PT LONG TERM GOAL #2   Title  Patient  will complete sit <> stand transfer chair to chair with Min A and LRAD to improve functional independence for household and community mobility.     Baseline  max A +2 STS at W/C (06/12/2018); max A +2-3 STS or stand pivot transfer, able to complete more reps (06/19/2018); 07/08/18: not safe to attempt at this time; 09/11/18: requires maxA+1 or modA+2 for STSs and squat pivot transfer; 10/23/18: requires maxA+1 or modA+2 for STSs and squat pivot transfer; 10/12: requires maxA+1 or modA+2 for STSs and squat pivot transfer; 10/29: patient able to complete sit<>Stand transfer min A +1 with parallel bars and RW, although inconsistent, mod A for stand pivot;    Time  12    Period  Weeks    Status  Partially Met    Target Date  02/27/19      PT LONG TERM GOAL #3   Title  Patient will navigate power w/c with min A x 100 feet to improve mobility for household and short community distances.    Baseline  required total A (06/12/2018); able to roll 20 feet with min A (06/20/2018); 07/08/18: Pt can go approximately 10' without assist per family;  10/23/2018: pt's daughter states that his power WC mobility has become 100% better with the new hand control, slowed speed, and with practice; 10/12: pt's daughter reports that he can perform household WC mobility and limited community ambulation with minA    Time  12    Period  Weeks    Status  Achieved      PT LONG TERM GOAL #4   Title  Patient will complete car transfers W/C to family SUV with min A using LRAD to improve his abilty to participate in community activities.     Baseline  unable to complete car transfers at all (06/12/2018); unable to complete car transfers at all (06/19/2018).; 07/08/18: Unable to perform at this time; 09/11/18: unable to perform at this time; 10/23/2018: unable to complete car transfers at all; 10/12:  unable to complete car transfers at all, 10/29: unable    Time  12    Period  Weeks    Status  On-going  Target Date  02/27/19      PT LONG TERM GOAL  #5   Title  Patient will ambulate at least 150 feet with mod I using LRAD to improve mobility for household and community distances.     Baseline  unable to take steps (06/12/2018); able to shift R leg forward and back with max A and RW at edge of plinth (06/19/2018); 07/08/18: unable to ambulate at this time. 09/11/18: unable to perform at this time; 10/23/2018: unable to perform at this time; 10/12: unable to perform at this time, 10/29: unable at this time    Time  12    Period  Weeks    Status  On-going    Target Date  02/27/19      Additional Long Term Goals   Additional Long Term Goals  Yes      PT LONG TERM GOAL #6   Title  Patient will be able to safety navigate up/down ramp with power chair, exhibiting good safety awareness, mod I to safely enter/exit his home.    Baseline  unable to complet stairs (06/12/2018); unable to complete stairs (06/19/2018); 07/08/18: unable to attempt at this time; 09/11/18: unsafe to attempt at this time; 10/23/2018: unable to complet stairs (06/12/2018); unable to complete stairs (06/19/2018); 07/08/18: unable to attempt at this time; 09/11/18: unsafe to attempt at this time; 10/23/18: unsafe to attempt at this time, 10/29: requires superivison and occasional min A for safety    Time  12    Period  Weeks    Status  Revised    Target Date  02/27/19      PT LONG TERM GOAL #7   Title  Patient will increase functional reach test to >15 inches in sitting to exhibit improved sitting balance and positioning and reduce fall risk;    Baseline  07/30/18: 12 inches; 09/11/18: 10-12 inches; 10/23/18: 12-13 inch; 10/12: 14.5", 10/29: 15.5 inch    Time  4    Period  Weeks    Status  Achieved      PT LONG TERM GOAL #8   Title  Patient will increase LLE quad strength to 3+/5 to improve functional strength for standing and mobility;    Baseline  10/28: 2+/5    Time  12    Period  Weeks    Status  New    Target Date  02/27/19            Plan - 12/26/18 1048    Clinical Impression  Statement  Patient motivated and participated well within session. he is progressing well with Nustep activity with improved speed and increased LE muscle activation. He does continue to fatigue quickly and exhibits good cardiovascular HR response with advanced challenge. Patient is also progressing with overall mobility moving LLE more with functional tasks being able to lift foot on/off foot place with power chair. He does sometimes require increased time and cues for motor planning but is exhibiting increased muscle activation. Instructed patient in sit<>Stand transfers in parallel bars this session. He was able to exhibit improved transfer ability but is continuing to have heavy posterior lean upon standing. Patient educated in weight shifts and proper trunk control this session with less assistance from therapist. With less assistance, he does fatigue quicker being unable to hold standing position as long. Patient would benefit from additional skilled PT intervention to improve strength, balance and mobility;    Personal Factors and Comorbidities  Age;Comorbidity 3+    Comorbidities  Relevant  past medical history and comorbidities include long term steroid use for lupus, CKD, chronic osteomyelitis, cervical spine stenosis, BPH, APS, alcohol abuse, peripheral artery disease, depression, GERD, obstructive sleep apnea, HTN, IBS, IgA deficiency, lumbar radiculopathy, RA, Stroke, toxic maculopathy in both eyes, systemic lupus erythematosus related syndrome, cardiac catheterization, carotid endarterectomy, cervical laminectomy, coronary angioplasty, Fusion C5-C7, knee arthroplasty, lumbar surgery.    Examination-Activity Limitations  Bed Mobility;Bend;Sit;Toileting;Stand;Stairs;Lift;Transfers;Squat;Locomotion Level;Carry;Dressing;Hygiene/Grooming;Continence    Examination-Participation Restrictions  Yard Work;Interpersonal Relationship;Community Activity    Stability/Clinical Decision Making  Evolving/Moderate  complexity    Rehab Potential  Fair    PT Frequency  3x / week    PT Duration  12 weeks    PT Treatment/Interventions  ADLs/Self Care Home Management;Electrical Stimulation;Moist Heat;Cryotherapy;Gait training;Stair training;Functional mobility training;Therapeutic exercise;Balance training;Neuromuscular re-education;Cognitive remediation;Patient/family education;Orthotic Fit/Training;Wheelchair mobility training;Manual techniques;Passive range of motion;Energy conservation;Joint Manipulations    PT Next Visit Plan  Update goals, progress note; Work on strength, sitting/standing balance, functional activities such as rolling, bed mobility, transfers; caregiver training    PT Home Exercise Plan  handout provided via Beth during previous sessions    Consulted and Agree with Plan of Care  Patient;Family member/caregiver    Family Member Consulted  daughter [name]       Patient will benefit from skilled therapeutic intervention in order to improve the following deficits and impairments:  Abnormal gait, Decreased activity tolerance, Decreased cognition, Decreased endurance, Decreased knowledge of use of DME, Decreased range of motion, Decreased skin integrity, Decreased strength, Impaired perceived functional ability, Impaired sensation, Impaired UE functional use, Improper body mechanics, Pain, Cardiopulmonary status limiting activity, Decreased balance, Decreased coordination, Decreased mobility, Difficulty walking, Impaired tone, Postural dysfunction  Visit Diagnosis: Muscle weakness (generalized)  Other lack of coordination  Difficulty in walking, not elsewhere classified     Problem List Patient Active Problem List   Diagnosis Date Noted  . Sepsis ([place]) 10/18/2018    Trotter,Margaret PT, DPT 12/26/2018, 10:55 AM  [place] MAIN [facility] SERVICES [address] [place], [place], [address] Phone: [phone]   Fax:  [phone]  Name: Kenneth Summers MRN: [id number] Date of Birth: 05-25-1945

## 2018-12-25 NOTE — Therapy (Addendum)
Kenneth Summers [facility] SERVICES [address] [place], [place], [address] Phone: [phone]   Fax:  [phone]  Occupational Therapy Treatment  Patient Details  Name: Kenneth Summers MRN: [id number] Date of Birth: [birth year] No data recorded  Encounter Date: 12/25/2018  OT End of Session - 12/25/18 1601    Visit Number  82    Number of Visits  45   Date for OT Re-Evaluation  01/15/19    Authorization Type  Progress report period starting 10/30/2018    OT Start Time  1345    OT Stop Time  1425    OT Time Calculation (min)  40 min    Activity Tolerance  Patient tolerated treatment well    Behavior During Therapy  [facility] for tasks assessed/performed       Past Medical History:  Diagnosis Date  . Alcohol abuse   . APS (antiphospholipid syndrome) ([place])   . BPH (benign prostatic hyperplasia)   . CAD (coronary artery disease)   . Cellulitis   . Cervical spinal stenosis   . Chronic osteomyelitis ([place])   . CKD (chronic kidney disease)   . CKD (chronic kidney disease)   . Clostridium difficile diarrhea   . Depression   . Diplopia   . Fatigue   . GERD (gastroesophageal reflux disease)   . Hyperlipemia   . Hypertension   . Hypovitaminosis D   . IBS (irritable bowel syndrome)   . IgA deficiency ([place])   . Insomnia   . Left lumbosacral radiculopathy   . Moderate obstructive sleep apnea   . Osteomyelitis of foot ([place])   . PAD (peripheral artery disease) ([place])   . Pruritus   . RA (rheumatoid arthritis) ([place])   . Radiculopathy   . Restless legs syndrome   . RLS (restless legs syndrome)   . SLE (systemic lupus erythematosus related syndrome) ([place])   . Stroke ([place])   . Toxic maculopathy of both eyes     Past Surgical History:  Procedure Laterality Date  . CARDIAC CATHETERIZATION    . CAROTID ENDARTERECTOMY    . CERVICAL LAMINECTOMY    . CORONARY ANGIOPLASTY    . FRACTURE SURGERY    . fusion C5-6-7    . HERNIA REPAIR    . KNEE  ARTHROSCOPY    . TONSILLECTOMY      There were no vitals filed for this visit.  Subjective Assessment - 12/25/18 1600    Subjective   Pt. reports that he had a nice weekend    Patient is accompanied by:  Family member    Pertinent History  Pt. is a 73 y.o. 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 Pt. PMHx includes: Multiple Falls, Lupus, DJD, Renal Abscess, CVA. Pt. resides with his wife. Pt.'s wife and daughter assist with ADLs. Pt. has caregivers in  for 2 hours a day, 6 days a week. Pt. received Rehab services in acute care, at SNF for STR, and [place] services. Pt. is retired from [employer] for [employer] and [employer].    Currently in Pain?  No/denies     OT TREATMENT    Neuro muscular re-education:  Pt. worked on grasping, flipping, turning, and stacking [place] style discs using his left hand. Pt. required visual demonstration, cues for movement patterns, cues for using his left hand and cues for reaching to the far left.   Therapeutic Exercise:  Pt. performed 2# dowel ex. For UE  strengthening secondary to weakness. Bilateral shoulder flexion, chest press, circular patterns. Pt. worked on 1 Psychiatric nurse. Pt. Worked on bilateral UE reaching with the 2# dowel reaching for targets at various planes focusing on incorporating trunk flexion into the task when reaching.    Response to Treatment  Pt. continues to make steady progress. Pt. has improved with donning a sweatshirt, zipping, and using a buttonhook. Pt. was very fatigued today requiring increased rest breaks, as well as breaks with position changes. Pt. continues to work on improving [place] strength, consistency with using his left UE, and hand during tasks.                         OT Education - 12/25/18 1601    Education Details  LUE functioning    Person(s) Educated  Patient    Methods  Explanation;Demonstration    Comprehension  Returned  demonstration;Tactile cues required;Verbalized understanding;Need further instruction          OT Long Term Goals - 12/25/18 1603      OT LONG TERM GOAL #1   Title  Pt. will increase left shoulder flexion AROM by 10 degrees to access cabinet/shelf.    Baseline  Pt. is improving with LUE ROM, and functional reaching    Time  12    Period  Weeks    Status  On-going    Target Date  01/15/19      OT LONG TERM GOAL #2   Title  Pt. will donn a shirt with Supervision.    Baseline  MinA    Time  12    Period  Weeks    Status  On-going    Target Date  01/15/19      OT LONG TERM GOAL #3   Title  Pt. will require ModA to perform LE dressing    Baseline  MaxA left, ModA with the right    Time  12    Period  Weeks    Status  On-going    Target Date  01/15/19      OT LONG TERM GOAL #4   Title  Pt. will improve left grip strength by 10# to be able to open a jar/container.    Baseline  Pt. has difficulty opening containers    Time  12    Period  Weeks    Status  On-going    Target Date  01/15/19      OT LONG TERM GOAL #5   Title  Pt. will improve left hand [facility] skills to be able to assit with buttoning/zipping.    Baseline  Pt. has difficulty [facility] skills buttoning/zipping    Time  12    Period  Weeks    Status  On-going    Target Date  01/15/19      OT LONG TERM GOAL #6   Title  Pt. will demonstrate visual conmpensatory strategies during 100% of the time during ADLs    Baseline  Pt. requires cues at times for left sides awareness.    Time  12    Period  Weeks    Status  On-going    Target Date  01/15/19      OT LONG TERM GOAL #7   Title  Pt. will prepare a simple cold snack from w/c with supervision using cognitive compensatory strategies 100% of the time.    Baseline  Pt. is unable    Time  12  Period  Weeks    Status  On-going    Target Date  01/15/19      OT LONG TERM GOAL #8   Title  Pt. will accurately identify potential safety hazard using good safety  awareness, and judgement 100% for ADLs, and IADLs.    Baseline  Limited    Time  12    Period  Weeks    Status  On-going    Target Date  01/15/20      OT LONG TERM GOAL  #9   TITLE  Pt. will  navigate his w/c around obstacles with Supervision and 100% accuracy    Baseline  Pt. requires cuing for obstacle on the left    Time  12    Period  Weeks    Status  On-going    Target Date  01/15/19            Plan - 12/25/18 1609    Clinical Impression Statement  Pt. continues to make steady progress. Pt. has improved with donning a sweatshirt, zipping, and using a buttonhook. Pt. was very fatigued today requiring increased rest breaks, as well as breaks with position changes. Pt. continues to work on improving [place] strength, consistency with using his left UE, and hand during tasks.    OT Occupational Profile and History  Problem Focused Assessment - Including review of records relating to presenting problem    Occupational performance deficits (Please refer to evaluation for details):  ADL's    Body Structure / Function / Physical Skills  UE functional use;Coordination;FMC;Dexterity;Strength;ROM    Cognitive Skills  Attention;Memory;Emotional;Problem Solve;Safety Awareness    Rehab Potential  Good    Clinical Decision Making  Several treatment options, min-mod task modification necessary    Comorbidities impacting occupational performance description:  Phyical, cognitive, visual,  medical comorbidities    Modification or Assistance to Complete Evaluation   No modification of tasks or assist necessary to complete eval    OT Frequency  3x / week    OT Duration  12 weeks    OT Treatment/Interventions  Self-care/ADL training;DME and/or AE instruction;Therapeutic exercise;Therapeutic activities;Moist Heat;Cognitive remediation/compensation;Neuromuscular education;Visual/perceptual remediation/compensation;Coping strategies training;Patient/family education;Passive range of motion;Psychosocial  skills training;Energy conservation;Functional Mobility Training    Consulted and Agree with Plan of Care  Patient    Family Member Consulted  Daughter [name]       Patient will benefit from skilled therapeutic intervention in order to improve the following deficits and impairments:   Body Structure / Function / Physical Skills: UE functional use, Coordination, FMC, Dexterity, Strength, ROM Cognitive Skills: Attention, Memory, Emotional, Problem Solve, Safety Awareness     Visit Diagnosis: Muscle weakness (generalized)  Other lack of coordination    Problem List Patient Active Problem List   Diagnosis Date Noted  . Sepsis ([place]) 10/18/2018    Harrel Carina, MS, OTR/L 12/25/2018, 5:33 PM  [facility] SERVICES [address] [place], [place], [address] Phone: [phone]   Fax:  [phone]  Name: CHRISHUN KIMURA MRN: [id number] Date of Birth: 09/04/1945

## 2018-12-26 ENCOUNTER — Ambulatory Visit: Payer: Medicare Other | Admitting: Occupational Therapy

## 2018-12-26 ENCOUNTER — Other Ambulatory Visit: Payer: Self-pay

## 2018-12-26 ENCOUNTER — Encounter: Payer: Self-pay | Admitting: Occupational Therapy

## 2018-12-26 ENCOUNTER — Encounter: Payer: Medicare Other | Admitting: Occupational Therapy

## 2018-12-26 ENCOUNTER — Ambulatory Visit: Payer: Medicare Other

## 2018-12-26 DIAGNOSIS — M6281 Muscle weakness (generalized): Secondary | ICD-10-CM

## 2018-12-26 DIAGNOSIS — R278 Other lack of coordination: Secondary | ICD-10-CM

## 2018-12-26 DIAGNOSIS — R262 Difficulty in walking, not elsewhere classified: Secondary | ICD-10-CM

## 2018-12-26 NOTE — Therapy (Addendum)
[place] MAIN [facility] SERVICES [address] [place], [place], [address] Phone: [phone]   Fax:  [phone]  Occupational Therapy Treatment  Patient Details  Name: Kenneth Summers MRN: [id number] Date of Birth: 09-Mar-1945 No data recorded  Encounter Date: 12/26/2018  OT End of Session - 12/26/18 1501    Visit Number  71    Number of Visits  59   Date for OT Re-Evaluation  01/15/19    Authorization Type  Progress report period starting 12/26/2018    OT Start Time  1300    OT Stop Time  1345    OT Time Calculation (min)  45 min    Activity Tolerance  Patient tolerated treatment well    Behavior During Therapy  [facility] for tasks assessed/performed       Past Medical History:  Diagnosis Date  . Alcohol abuse   . APS (antiphospholipid syndrome) ([place])   . BPH (benign prostatic hyperplasia)   . CAD (coronary artery disease)   . Cellulitis   . Cervical spinal stenosis   . Chronic osteomyelitis ([place])   . CKD (chronic kidney disease)   . CKD (chronic kidney disease)   . Clostridium difficile diarrhea   . Depression   . Diplopia   . Fatigue   . GERD (gastroesophageal reflux disease)   . Hyperlipemia   . Hypertension   . Hypovitaminosis D   . IBS (irritable bowel syndrome)   . 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 Insomnia   . Left lumbosacral radiculopathy   . Moderate obstructive sleep apnea   . Osteomyelitis of foot ([place])   . PAD (peripheral artery disease) ([place])   . Pruritus   . RA (rheumatoid arthritis) ([place])   . Radiculopathy   . Restless legs syndrome   . RLS (restless legs syndrome)   . SLE (systemic lupus erythematosus related syndrome) ([place])   . Stroke ([place])   . Toxic maculopathy of both eyes     Past Surgical History:  Procedure Laterality Date  . CARDIAC CATHETERIZATION    . CAROTID ENDARTERECTOMY    . CERVICAL LAMINECTOMY    . CORONARY ANGIOPLASTY    . FRACTURE SURGERY    . fusion C5-6-7    . HERNIA REPAIR    . KNEE  ARTHROSCOPY    . TONSILLECTOMY      There were no vitals filed for this visit.  Subjective Assessment - 12/26/18 1500    Subjective   Pt. reports that he had a nice weekend    Patient is accompanied by:  Family member    Pertinent History  Pt. is a 73 y.o. 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 Pt. PMHx includes: Multiple Falls, Lupus, DJD, Renal Abscess, CVA. Pt. resides with his wife. Pt.'s wife and daughter assist with ADLs. Pt. has caregivers in  for 2 hours a day, 6 days a week. Pt. received Rehab services in acute care, at SNF for STR, and [place] services. Pt. is retired from [employer] for [employer] and [employer].    Patient Stated Goals  To regain use of his left UE, and do more for himself.    Currently in Pain?  No/denies      OT TREATMENT    Selfcare:  Pt. worked on UE dressing tasks using hemi-dressing techniques. Pt. requiredlesscues forinitiating position the sweatshirtwhenpreparing todonn a zip down sweatshirt. Pt. Required fewer cues to thread the LUE with the right,  assist to bring the jacket around his back, and reposition the jacket to prepare for zipping  Neuro muscular re-education:  Pt. performed left hand [place] tasks using the Grooved pegboard. Pt. worked on grasping the grooved pegs from a horizontal position, and moving the pegs to a vertical position in the hand to prepare for placing them in the grooved slot. The grooved pegboard was positioned vertically to encourage wrist extension. Pt. Removed the pegs while grasping, and storing them in his left hand.  Therapeutic Exercise:  Pt. performed 2# dowel ex. For UE strengthening secondary to weakness. Bilateral shoulder flexion, chest press, circular patterns. Pt. worked on 1 Psychiatric nurse. Pt. Worked on bilateral UE reaching with the 2# dowel reaching for targets at various planes focusing on incorporating trunk flexion into the task when reaching  Response to  Treatment:   Pt. continues to make steady progress with UE dressing requiring fewer cues, and assist to Kenneth his jacket. Pt. was less fatigued today with no reports of fatigue. Pt. was able to tolerate the UE exercises well, and was able to engage his core fluidly during exercises with no leaning to the left. Pt. continues to work on improving LUE functioning in order to improve ADLs/IADLs, and promote independence while reducing caregiver burden.                       OT Education - 12/26/18 1501    Education Details  LUE functioning    Person(s) Educated  Patient    Methods  Explanation;Demonstration    Comprehension  Returned demonstration;Tactile cues required;Verbalized understanding;Need further instruction          OT Long Term Goals - 12/25/18 1603      OT LONG TERM GOAL #1   Title  Pt. will increase left shoulder flexion AROM by 10 degrees to access cabinet/shelf.    Baseline  Pt. is improving with LUE ROM, and functional reaching    Time  12    Period  Weeks    Status  On-going    Target Date  01/15/19      OT LONG TERM GOAL #2   Title  Pt. will Kenneth a shirt with Supervision.    Baseline  MinA    Time  12    Period  Weeks    Status  On-going    Target Date  01/15/19      OT LONG TERM GOAL #3   Title  Pt. will require ModA to perform LE dressing    Baseline  MaxA left, ModA with the right    Time  12    Period  Weeks    Status  On-going    Target Date  01/15/19      OT LONG TERM GOAL #4   Title  Pt. will improve left grip strength by 10# to be able to open a jar/container.    Baseline  Pt. has difficulty opening containers    Time  12    Period  Weeks    Status  On-going    Target Date  01/15/19      OT LONG TERM GOAL #5   Title  Pt. will improve left hand [facility] skills to be able to assit with buttoning/zipping.    Baseline  Pt. has difficulty [facility] skills buttoning/zipping    Time  12    Period  Weeks    Status  On-going    Target Date  01/15/19      OT LONG TERM GOAL #6   Title  Pt. will demonstrate visual conmpensatory strategies during 100% of the time during ADLs    Baseline  Pt. requires cues at times for left sides awareness.    Time  12    Period  Weeks    Status  On-going    Target Date  01/15/19      OT LONG TERM GOAL #7   Title  Pt. will prepare a simple cold snack from w/c with supervision using cognitive compensatory strategies 100% of the time.    Baseline  Pt. is unable    Time  12    Period  Weeks    Status  On-going    Target Date  01/15/19      OT LONG TERM GOAL #8   Title  Pt. will accurately identify potential safety hazard using good safety awareness, and judgement 100% for ADLs, and IADLs.    Baseline  Limited    Time  12    Period  Weeks    Status  On-going    Target Date  01/15/20      OT LONG TERM GOAL  #9   TITLE  Pt. will  navigate his w/c around obstacles with Supervision and 100% accuracy    Baseline  Pt. requires cuing for obstacle on the left    Time  12    Period  Weeks    Status  On-going    Target Date  01/15/19            Plan - 12/26/18 1502    Clinical Impression Statement  Pt. continues to make steady progress with UE dressing requiring fewer cues, and assist to Kenneth his jacket. Pt. was less fatigued today with no reports of fatigue. Pt. was able to tolerate the UE exercises well, and was able to engage his core fluidly during exercises with no leaning to the left. Pt. continues to work on improving LUE functioning in order to improve ADLs/IADLs, and promote independence while reducing caregiver burden   OT Occupational Profile and History  Problem Focused Assessment - Including review of records relating to presenting problem    Occupational performance deficits (Please refer to evaluation for details):  ADL's    Body Structure / Function / Physical Skills  UE functional use;Coordination;FMC;Dexterity;Strength;ROM    Cognitive Skills   Attention;Memory;Emotional;Problem Solve;Safety Awareness    Rehab Potential  Good    Clinical Decision Making  Several treatment options, min-mod task modification necessary    Comorbidities Affecting Occupational Performance:  Presence of comorbidities impacting occupational performance    Comorbidities impacting occupational performance description:  Phyical, cognitive, visual,  medical comorbidities    Modification or Assistance to Complete Evaluation   No modification of tasks or assist necessary to complete eval    OT Frequency  3x / week    OT Duration  12 weeks    OT Treatment/Interventions  Self-care/ADL training;DME and/or AE instruction;Therapeutic exercise;Therapeutic activities;Moist Heat;Cognitive remediation/compensation;Neuromuscular education;Visual/perceptual remediation/compensation;Coping strategies training;Patient/family education;Passive range of motion;Psychosocial skills training;Energy conservation;Functional Mobility Training    Consulted and Agree with Plan of Care  Patient    Family Member Consulted  Daughter [name]       Patient will benefit from skilled therapeutic intervention in order to improve the following deficits and impairments:   Body Structure / Function / Physical Skills: UE functional use, Coordination, FMC, Dexterity, Strength, ROM Cognitive Skills: Attention, Memory, Emotional, Problem Solve, Safety Awareness  Visit Diagnosis: Muscle weakness (generalized)  Other lack of coordination    Problem List Patient Active Problem List   Diagnosis Date Noted  . Sepsis ([place]) 10/18/2018    Harrel Carina 12/26/2018, 3:09 PM  [place] MAIN [facility] SERVICES [address] [place], [place], [address] Phone: [phone]   Fax:  [phone]  Name: Kenneth Summers MRN: [id number] Date of Birth: 12/28/1945

## 2018-12-26 NOTE — Therapy (Signed)
[place] MAIN [facility] SERVICES [address] [place], [place], [address] Phone: [phone]   Fax:  [phone]  Physical Therapy Treatment  Patient Details  Name: Kenneth Summers MRN: [id number] Date of Birth: 02-22-1945 Referring Provider (PT): Dr. [name], Guy Begin   Encounter Date: 12/26/2018  PT End of Session - 12/26/18 1730    Visit Number  7    Number of Visits  108    Date for PT Re-Evaluation  02/27/19    Authorization Type  Medicare reporting period starting 09/11/18    Authorization Time Period  goals last assessed 12/04/18    PT Start Time  1345    PT Stop Time  1430    PT Time Calculation (min)  45 min    Equipment Utilized During Treatment  Gait belt    Activity Tolerance  Patient tolerated treatment well;Patient limited by fatigue    Behavior During Therapy  [facility] for tasks assessed/performed       Past Medical History:  Diagnosis Date  . Alcohol abuse   . APS (antiphospholipid syndrome) ([place])   . BPH (benign prostatic hyperplasia)   . CAD (coronary artery disease)   . Cellulitis   . Cervical spinal stenosis   . Chronic osteomyelitis ([place])   . CKD (chronic kidney disease)   . CKD (chronic kidney disease)   . Clostridium difficile diarrhea   . Depression   . Diplopia   . Fatigue   . GERD (gastroesophageal reflux disease)   . Hyperlipemia   . Hypertension   . Hypovitaminosis D   . IBS (irritable bowel syndrome)   . IgA deficiency ([place])   . Insomnia   . Left lumbosacral radiculopathy   . Moderate obstructive sleep apnea   . Osteomyelitis of foot ([place])   . PAD (peripheral artery disease) ([place])   . Pruritus   . RA (rheumatoid arthritis) ([place])   . Radiculopathy   . Restless legs syndrome   . RLS (restless legs syndrome)   . SLE (systemic lupus erythematosus related syndrome) ([place])   . Stroke ([place])   . Toxic maculopathy of both eyes     Past Surgical History:  Procedure Laterality Date  . CARDIAC  CATHETERIZATION    . CAROTID ENDARTERECTOMY    . CERVICAL LAMINECTOMY    . CORONARY ANGIOPLASTY    . FRACTURE SURGERY    . fusion C5-6-7    . HERNIA REPAIR    . KNEE ARTHROSCOPY    . TONSILLECTOMY      There were no vitals filed for this visit.  Subjective Assessment - 12/26/18 1730    Subjective  Pt reports that he feels good today. He reports no pain, soreness, or fatigue today.  No new questions or concerns at this time.    Patient is accompained by:  Family member    Pertinent History  Patient is a 73 y.o. male who presents to outpatient physical therapy with a referral for medical diagnosis of CVA. This patient's chief complaints consist of left hemiplegia and overall deconditioning leading to the following functional deficits: dependent for ADLs, IADLs, unable to transfer to car, dependent transfers at home with hoyer lift, unable to walk or stand without significant assistance, difficulty with W/C navigation..    Limitations  Sitting;Lifting;Standing;House hold activities;Writing;Walking;Other (comment)    Diagnostic tests  Brain MRI 11/01/2017: IMPRESSION: 1. Acute right ACA territory infarct as demonstrated on prior CT imaging. No intracranial hemorrhage or significant mass effect. 2. Age  advanced global brain atrophy and small chronic right occipital Infarct.    Currently in Pain?  No/denies          TREATMENT:   STS transfer from mat table to RW x3 with with CGA-minA+2 during the first 2 stands, and modA+2 for the 3rd stand requiring assistance with holding the RW down to stabilize.   He required cueing to pull his hips in, and bilateral knee blocking was provided in case of collapse.  He stood up between 20-45 sec for each bout.  He performed STSs better with both UE on RW, but required PT to push down through RW to prevent it from tipping, but demonstrated the ability to anteriorly weight shift with more accuracy.        Lite Gait come to stand x3 with 3 marches  bilaterally in standing during the 1st stand; displays a tendency to posteriorly weight shift, relying on the harness to support him.    Seated knee extensions x10 bilaterally   Seated hip flexion x10 bilaterally  Seated hip abduction x10 bilaterally  Seated hip adduction x10 bilaterally    Patient demonstrates good motivation throughout today's session.  He demonstrates improvements in his ability to perform STSs with less assistance today, demonstrating the ability to come to stand with CGA/minA on 2 stands.  He continues to require cueing for anterior weight shifting while performing STSs and maintaining a standing position.  The Lite Gait was trialed today, with the patient demonstrating the ability to perform 3 marches, however he has a tendency to rely on the harness to support his weight, rather than standing.  Pt will benefit from continued skilled PT services in order to increase strength and functional mobilityin order to decrease caregiver burden, improve his ability to perform ADLs, and to improve his QOL.                         PT Short Term Goals - 12/04/18 1303      PT SHORT TERM GOAL #1   Title  Be independent with initial home exercise program for self-management of symptoms.    Baseline  HEP to be given at second session; advice to practice unsupported sitting at home (06/19/2018); 07/08/18: Performing at home, 6/29: needs assistance but is doing exercise program regularly; 08/22/18: adherent;    Time  2    Period  Weeks    Status  Achieved    Target Date  06/26/18        PT Long Term Goals - 12/04/18 1303      PT LONG TERM GOAL #1   Title  Patient will complete all bed mobility with min A to improve functional independence for getting in and out of bed and adjusting in bed.     Baseline  max A - total A reported by family (06/19/2018); 07/08/18: still requires maxA, dtr reports improvement in rolling since starting therapy; 6/23: min A to  supervision in clinic; not doing at home right; 8/5: Pt's daughter states that his rolling has improved, but still has difficulty with all other bed mobility; 10/23/18: pts daughter states that she is doing about 35% of the work if he is in proper position, he does uses the bed rails to help roll;  using Harrel Lemon for coming to sit EOB; 10/12: pts daughter states that she is doing about 30% of the work if he is in proper position, he does uses the bed rails to help  roll, but it is improving;  using Hoyer for coming to sit EOB, 10/29: min A for sit to sidelying, rolling supervision, able to transition sidelying to sit with HHA;    Time  12    Period  Weeks    Status  Achieved      PT LONG TERM GOAL #2   Title  Patient will complete sit <> stand transfer chair to chair with Min A and LRAD to improve functional independence for household and community mobility.     Baseline  max A +2 STS at W/C (06/12/2018); max A +2-3 STS or stand pivot transfer, able to complete more reps (06/19/2018); 07/08/18: not safe to attempt at this time; 09/11/18: requires maxA+1 or modA+2 for STSs and squat pivot transfer; 10/23/18: requires maxA+1 or modA+2 for STSs and squat pivot transfer; 10/12: requires maxA+1 or modA+2 for STSs and squat pivot transfer; 10/29: patient able to complete sit<>Stand transfer min A +1 with parallel bars and RW, although inconsistent, mod A for stand pivot;    Time  12    Period  Weeks    Status  Partially Met    Target Date  02/27/19      PT LONG TERM GOAL #3   Title  Patient will navigate power w/c with min A x 100 feet to improve mobility for household and short community distances.    Baseline  required total A (06/12/2018); able to roll 20 feet with min A (06/20/2018); 07/08/18: Pt can go approximately 10' without assist per family;  10/23/2018: pt's daughter states that his power WC mobility has become 100% better with the new hand control, slowed speed, and with practice; 10/12: pt's daughter reports that  he can perform household WC mobility and limited community ambulation with minA    Time  12    Period  Weeks    Status  Achieved      PT LONG TERM GOAL #4   Title  Patient will complete car transfers W/C to family SUV with min A using LRAD to improve his abilty to participate in community activities.     Baseline  unable to complete car transfers at all (06/12/2018); unable to complete car transfers at all (06/19/2018).; 07/08/18: Unable to perform at this time; 09/11/18: unable to perform at this time; 10/23/2018: unable to complete car transfers at all; 10/12:  unable to complete car transfers at all, 10/29: unable    Time  12    Period  Weeks    Status  On-going    Target Date  02/27/19      PT LONG TERM GOAL #5   Title  Patient will ambulate at least 150 feet with mod I using LRAD to improve mobility for household and community distances.     Baseline  unable to take steps (06/12/2018); able to shift R leg forward and back with max A and RW at edge of plinth (06/19/2018); 07/08/18: unable to ambulate at this time. 09/11/18: unable to perform at this time; 10/23/2018: unable to perform at this time; 10/12: unable to perform at this time, 10/29: unable at this time    Time  12    Period  Weeks    Status  On-going    Target Date  02/27/19      Additional Long Term Goals   Additional Long Term Goals  Yes      PT LONG TERM GOAL #6   Title  Patient will be able to safety navigate  up/down ramp with power chair, exhibiting good safety awareness, mod I to safely enter/exit his home.    Baseline  unable to complet stairs (06/12/2018); unable to complete stairs (06/19/2018); 07/08/18: unable to attempt at this time; 09/11/18: unsafe to attempt at this time; 10/23/2018: unable to complet stairs (06/12/2018); unable to complete stairs (06/19/2018); 07/08/18: unable to attempt at this time; 09/11/18: unsafe to attempt at this time; 10/23/18: unsafe to attempt at this time, 10/29: requires superivison and occasional min A for safety     Time  12    Period  Weeks    Status  Revised    Target Date  02/27/19      PT LONG TERM GOAL #7   Title  Patient will increase functional reach test to >15 inches in sitting to exhibit improved sitting balance and positioning and reduce fall risk;    Baseline  07/30/18: 12 inches; 09/11/18: 10-12 inches; 10/23/18: 12-13 inch; 10/12: 14.5", 10/29: 15.5 inch    Time  4    Period  Weeks    Status  Achieved      PT LONG TERM GOAL #8   Title  Patient will increase LLE quad strength to 3+/5 to improve functional strength for standing and mobility;    Baseline  10/28: 2+/5    Time  12    Period  Weeks    Status  New    Target Date  02/27/19            Plan - 12/26/18 1729    Clinical Impression Statement  Patient demonstrates good motivation throughout today's session.  He demonstrates improvements in his ability to perform STSs with less assistance today, demonstrating the ability to come to stand with CGA/minA on 2 stands.  He continues to require cueing for anterior weight shifting while performing STSs and maintaining a standing position.  The Lite Gait was trialed today, with the patient demonstrating the ability to perform 3 marches, however he has a tendency to rely on the harness to support his weight, rather than standing.  Pt will benefit from continued skilled PT services in order to increase strength and functional mobility in order to decrease caregiver burden, improve his ability to perform ADLs, and to improve his QOL.    Personal Factors and Comorbidities  Age;Comorbidity 3+    Comorbidities  Relevant past medical history and comorbidities include long term steroid use for lupus, CKD, chronic osteomyelitis, cervical spine stenosis, BPH, APS, alcohol abuse, peripheral artery disease, depression, GERD, obstructive sleep apnea, HTN, IBS, IgA deficiency, lumbar radiculopathy, RA, Stroke, toxic maculopathy in both eyes, systemic lupus erythematosus related syndrome, cardiac  catheterization, carotid endarterectomy, cervical laminectomy, coronary angioplasty, Fusion C5-C7, knee arthroplasty, lumbar surgery.    Examination-Activity Limitations  Bed Mobility;Bend;Sit;Toileting;Stand;Stairs;Lift;Transfers;Squat;Locomotion Level;Carry;Dressing;Hygiene/Grooming;Continence    Examination-Participation Restrictions  Yard Work;Interpersonal Relationship;Community Activity    Stability/Clinical Decision Making  Evolving/Moderate complexity    Rehab Potential  Fair    PT Frequency  3x / week    PT Duration  12 weeks    PT Treatment/Interventions  ADLs/Self Care Home Management;Electrical Stimulation;Moist Heat;Cryotherapy;Gait training;Stair training;Functional mobility training;Therapeutic exercise;Balance training;Neuromuscular re-education;Cognitive remediation;Patient/family education;Orthotic Fit/Training;Wheelchair mobility training;Manual techniques;Passive range of motion;Energy conservation;Joint Manipulations    PT Next Visit Plan  Update goals, progress note; Work on strength, sitting/standing balance, functional activities such as rolling, bed mobility, transfers; caregiver training    PT Home Exercise Plan  handout provided via Beth during previous sessions    Consulted and Agree with Plan of  Care  Patient;Family member/caregiver    Family Member Consulted  daughter [name]       Patient will benefit from skilled therapeutic intervention in order to improve the following deficits and impairments:  Abnormal gait, Decreased activity tolerance, Decreased cognition, Decreased endurance, Decreased knowledge of use of DME, Decreased range of motion, Decreased skin integrity, Decreased strength, Impaired perceived functional ability, Impaired sensation, Impaired UE functional use, Improper body mechanics, Pain, Cardiopulmonary status limiting activity, Decreased balance, Decreased coordination, Decreased mobility, Difficulty walking, Impaired tone, Postural dysfunction  Visit  Diagnosis: Muscle weakness (generalized)  Difficulty in walking, not elsewhere classified  Other lack of coordination     Problem List Patient Active Problem List   Diagnosis Date Noted  . Sepsis ([place]) 10/18/2018    This entire session was performed under direct supervision and direction of a licensed therapist/therapist assistant . I have personally read, edited and approve of the note as written.   Lutricia Horsfall, SPT Phillips Grout PT, DPT, GCS  Huprich,Jason 12/27/2018, 2:19 PM  [place] MAIN [facility] SERVICES [address] [place], [place], [address] Phone: [phone]   Fax:  [phone]  Name: Kenneth Summers MRN: [id number] Date of Birth: [birth year]

## 2018-12-30 ENCOUNTER — Encounter: Payer: Self-pay | Admitting: Physical Therapy

## 2018-12-30 ENCOUNTER — Ambulatory Visit: Payer: Medicare Other | Admitting: Physical Therapy

## 2018-12-30 ENCOUNTER — Ambulatory Visit: Payer: Medicare Other | Admitting: Occupational Therapy

## 2018-12-30 ENCOUNTER — Other Ambulatory Visit: Payer: Self-pay

## 2018-12-30 DIAGNOSIS — M6281 Muscle weakness (generalized): Secondary | ICD-10-CM

## 2018-12-30 DIAGNOSIS — I69354 Hemiplegia and hemiparesis following cerebral infarction affecting left non-dominant side: Secondary | ICD-10-CM

## 2018-12-30 DIAGNOSIS — R278 Other lack of coordination: Secondary | ICD-10-CM

## 2018-12-30 DIAGNOSIS — R262 Difficulty in walking, not elsewhere classified: Secondary | ICD-10-CM

## 2018-12-30 NOTE — Therapy (Signed)
[place] MAIN [facility] [address] [place], [place], [address] Phone: [phone]   Fax:  [phone]  Physical Therapy Treatment  Patient Details  Name: Kenneth Summers MRN: [id number] Date of Birth: Jul 02, 1945 Referring Provider (PT): Dr. [name], Guy Begin   Encounter Date: 12/30/2018  PT End of Session - 12/30/18 1300    Visit Number  4    Number of Visits  108    Date for PT Re-Evaluation  02/27/19    Authorization Type  Medicare reporting period starting 09/11/18    Authorization Time Period  goals last assessed 12/04/18    PT Start Time  1302    PT Stop Time  1345    PT Time Calculation (min)  43 min    Equipment Utilized During Treatment  Gait belt    Activity Tolerance  Patient tolerated treatment well;Patient limited by fatigue    Behavior During Therapy  [employer] [facility] for tasks assessed/performed       Past Medical History:  Diagnosis Date  . Alcohol abuse   . APS (antiphospholipid syndrome) ([place])   . BPH (benign prostatic hyperplasia)   . CAD (coronary artery disease)   . Cellulitis   . Cervical spinal stenosis   . Chronic osteomyelitis ([place])   . CKD (chronic kidney disease)   . CKD (chronic kidney disease)   . Clostridium difficile diarrhea   . Depression   . Diplopia   . Fatigue   . GERD (gastroesophageal reflux disease)   . Hyperlipemia   . Hypertension   . Hypovitaminosis D   . IBS (irritable bowel syndrome)   . 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 Insomnia   . Left lumbosacral radiculopathy   . Moderate obstructive sleep apnea   . Osteomyelitis of foot ([place])   . PAD (peripheral artery disease) ([place])   . Pruritus   . RA (rheumatoid arthritis) ([place])   . Radiculopathy   . Restless legs syndrome   . RLS (restless legs syndrome)   . SLE (systemic lupus erythematosus related syndrome) ([place])   . Stroke ([place])   . Toxic maculopathy of both eyes     Past Surgical History:  Procedure Laterality Date  . CARDIAC  CATHETERIZATION    . CAROTID ENDARTERECTOMY    . CERVICAL LAMINECTOMY    . CORONARY ANGIOPLASTY    . FRACTURE SURGERY    . fusion C5-6-7    . HERNIA REPAIR    . KNEE ARTHROSCOPY    . TONSILLECTOMY      There were no vitals filed for this visit.  Subjective Assessment - 12/30/18 1301    Subjective  Patient reports doing well; no new falls; Denies any pain; Reports adherence with HEP;    Patient is accompained by:  Family member    Pertinent History  Patient is a 73 y.o. male who presents to outpatient physical therapy with a referral for medical diagnosis of CVA. This patient's chief complaints consist of left hemiplegia and overall deconditioning leading to the following functional deficits: dependent for ADLs, IADLs, unable to transfer to car, dependent transfers at home with hoyer lift, unable to walk or stand without significant assistance, difficulty with W/C navigation..    Limitations  Sitting;Lifting;Standing;House hold activities;Writing;Walking;Other (comment)    Diagnostic tests  Brain MRI 11/01/2017: IMPRESSION: 1. Acute right ACA territory infarct as demonstrated on prior CT imaging. No intracranial hemorrhage or significant mass effect. 2. 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  Currently in Pain?  No/denies    Multiple Pain Sites  No              TREATMENT: Patient transferred squat pivot wheelchair<>nustep with BUE and therapist assistance, min A +1 with daughter providing HHA as needed;Patient does require increased time for proper positioning and set up but was able to exhibit good safety with transfer; He requires cues for walking LLE foot around and required min A for placing LLE foot onto foot plate;   Following therapeutic activity: Instructed patient in Nustep, seat #15, arms #14 Pt instructed in BUE/BLE level 3x 3 minwith cues to increase speed as fast as possible, increasing steps per minute to>70, Then reduced  to BLE onlylevel 3 x28mn with cues to increase speed to >50 steps per minute, patient averaged45-55steps per minute, HR was 102bpm following exercise; Reports increased shortness of breath and fatigue;  Patient completed 0.21 miles total distance;   Following therapeutic exercise: Transferred nustep to wheelchair squat pivotwith mod A +2 with daughter assisting for safety; Patient required cues for hand placement and positioning. He had increased difficulty pivoting to wheelchair likely due to fatigue;  Patient is improving in LE AROM being able to place LLE foot on leg rest with cues and increased time;   Patient sitting in power chair with power chair elevated and tilted forward for better positioning: (seat height 27-28 inches from floor) Sit<>stand transfers inside parallel bars:   x2 reps; requires min A +1 for blocking left knee but otherwise patient able to pull self forward and stand with close supervision. He requires increased time and effort with mod VCs for forward weight shift and proper positioning. Upon standing patient able to hold position with close supervision with intermittent cues for proper weight shift. Patient able to hold standing position for 10-45 sec with increased effort and fatigue;  With increased repetition patient expressed increased fatigue in BUE Patient experienced vertigo episode following 2nd transfer requiring short seated rest break. He reports increased fatigue and feeling hot/nauseated. This resolved with short rest; vitals monitored, WFL   He required mod A for repositioning in chair at end of session. Reinforced HEP;                   PT Education - 12/30/18 1300    Education Details  LE strengthening, transfers, HEP    Person(s) Educated  Patient;Child(ren)    Methods  Explanation;Verbal cues    Comprehension  Verbalized understanding;Returned demonstration;Verbal cues required;Need further instruction       PT  Short Term Goals - 12/04/18 1303      PT SHORT TERM GOAL #1   Title  Be independent with initial home exercise program for self-management of symptoms.    Baseline  HEP to be given at second session; advice to practice unsupported sitting at home (06/19/2018); 07/08/18: Performing at home, 6/29: needs assistance but is doing exercise program regularly; 08/22/18: adherent;    Time  2    Period  Weeks    Status  Achieved    Target Date  06/26/18        PT Long Term Goals - 12/04/18 1303      PT LONG TERM GOAL #1   Title  Patient will complete all bed mobility with min A to improve functional independence for getting in and out of bed and adjusting in bed.     Baseline  max A - total A reported by family (06/19/2018); 07/08/18: still requires maxA,  dtr reports improvement in rolling since starting therapy; 6/23: min A to supervision in clinic; not doing at home right; 8/5: Pt's daughter states that his rolling has improved, but still has difficulty with all other bed mobility; 10/23/18: pts daughter states that she is doing about 35% of the work if he is in proper position, he does uses the bed rails to help roll;  using Harrel Lemon for coming to sit EOB; 10/12: pts daughter states that she is doing about 30% of the work if he is in proper position, he does uses the bed rails to help roll, but it is improving;  using Hoyer for coming to sit EOB, 10/29: min A for sit to sidelying, rolling supervision, able to transition sidelying to sit with HHA;    Time  12    Period  Weeks    Status  Achieved      PT LONG TERM GOAL #2   Title  Patient will complete sit <> stand transfer chair to chair with Min A and LRAD to improve functional independence for household and community mobility.     Baseline  max A +2 STS at W/C (06/12/2018); max A +2-3 STS or stand pivot transfer, able to complete more reps (06/19/2018); 07/08/18: not safe to attempt at this time; 09/11/18: requires maxA+1 or modA+2 for STSs and squat pivot transfer;  10/23/18: requires maxA+1 or modA+2 for STSs and squat pivot transfer; 10/12: requires maxA+1 or modA+2 for STSs and squat pivot transfer; 10/29: patient able to complete sit<>Stand transfer min A +1 with parallel bars and RW, although inconsistent, mod A for stand pivot;    Time  12    Period  Weeks    Status  Partially Met    Target Date  02/27/19      PT LONG TERM GOAL #3   Title  Patient will navigate power w/c with min A x 100 feet to improve mobility for household and short community distances.    Baseline  required total A (06/12/2018); able to roll 20 feet with min A (06/20/2018); 07/08/18: Pt can go approximately 10' without assist per family;  10/23/2018: pt's daughter states that his power WC mobility has become 100% better with the new hand control, slowed speed, and with practice; 10/12: pt's daughter reports that he can perform household WC mobility and limited community ambulation with minA    Time  12    Period  Weeks    Status  Achieved      PT LONG TERM GOAL #4   Title  Patient will complete car transfers W/C to family SUV with min A using LRAD to improve his abilty to participate in community activities.     Baseline  unable to complete car transfers at all (06/12/2018); unable to complete car transfers at all (06/19/2018).; 07/08/18: Unable to perform at this time; 09/11/18: unable to perform at this time; 10/23/2018: unable to complete car transfers at all; 10/12:  unable to complete car transfers at all, 10/29: unable    Time  12    Period  Weeks    Status  On-going    Target Date  02/27/19      PT LONG TERM GOAL #5   Title  Patient will ambulate at least 150 feet with mod I using LRAD to improve mobility for household and community distances.     Baseline  unable to take steps (06/12/2018); able to shift R leg forward and back with max A and RW  at edge of plinth (06/19/2018); 07/08/18: unable to ambulate at this time. 09/11/18: unable to perform at this time; 10/23/2018: unable to perform at  this time; 10/12: unable to perform at this time, 10/29: unable at this time    Time  12    Period  Weeks    Status  On-going    Target Date  02/27/19      Additional Long Term Goals   Additional Long Term Goals  Yes      PT LONG TERM GOAL #6   Title  Patient will be able to safety navigate up/down ramp with power chair, exhibiting good safety awareness, mod I to safely enter/exit his home.    Baseline  unable to complet stairs (06/12/2018); unable to complete stairs (06/19/2018); 07/08/18: unable to attempt at this time; 09/11/18: unsafe to attempt at this time; 10/23/2018: unable to complet stairs (06/12/2018); unable to complete stairs (06/19/2018); 07/08/18: unable to attempt at this time; 09/11/18: unsafe to attempt at this time; 10/23/18: unsafe to attempt at this time, 10/29: requires superivison and occasional min A for safety    Time  12    Period  Weeks    Status  Revised    Target Date  02/27/19      PT LONG TERM GOAL #7   Title  Patient will increase functional reach test to >15 inches in sitting to exhibit improved sitting balance and positioning and reduce fall risk;    Baseline  07/30/18: 12 inches; 09/11/18: 10-12 inches; 10/23/18: 12-13 inch; 10/12: 14.5", 10/29: 15.5 inch    Time  4    Period  Weeks    Status  Achieved      PT LONG TERM GOAL #8   Title  Patient will increase LLE quad strength to 3+/5 to improve functional strength for standing and mobility;    Baseline  10/28: 2+/5    Time  12    Period  Weeks    Status  New    Target Date  02/27/19            Plan - 12/31/18 0816    Clinical Impression Statement  Patient motivated and tolerated session fair. He was able to tolerate increased resistance and was moving faster on Nustep indicating improved LE strength and ROM. He does continue to require cues for motivation and attention to task as he is often distracted. Patient progressing well with sit<>stand transfers in parallel bars. he was able to transfer sit<>stand with  therapist blocking LLE knee only without any additional assistance. He does require increased time and increased effort with mod VCs for forward weight shift. He was able to stand longer with improved weight shift and less posterior lean requiring close supervision for safety. Patient fatigued quickly after transfer experiencing nystagmus and dizziness following transfer. Patient required short seated rest break and was able to recover. Reinforced HEP. He would benefit from additional skilled PT intervention to improve strength, balance and mobility;    Personal Factors and Comorbidities  Age;Comorbidity 3+    Comorbidities  Relevant past medical history and comorbidities include long term steroid use for lupus, CKD, chronic osteomyelitis, cervical spine stenosis, BPH, APS, alcohol abuse, peripheral artery disease, depression, GERD, obstructive sleep apnea, HTN, IBS, IgA deficiency, lumbar radiculopathy, RA, Stroke, toxic maculopathy in both eyes, systemic lupus erythematosus related syndrome, cardiac catheterization, carotid endarterectomy, cervical laminectomy, coronary angioplasty, Fusion C5-C7, knee arthroplasty, lumbar surgery.    Examination-Activity Limitations  Bed Mobility;Bend;Sit;Toileting;Stand;Stairs;Lift;Transfers;Squat;Locomotion Level;Carry;Dressing;Hygiene/Grooming;Continence  Examination-Participation Restrictions  Yard Work;Interpersonal Relationship;Community Activity    Stability/Clinical Decision Making  Evolving/Moderate complexity    Rehab Potential  Fair    PT Frequency  3x / week    PT Duration  12 weeks    PT Treatment/Interventions  ADLs/Self Care Home Management;Electrical Stimulation;Moist Heat;Cryotherapy;Gait training;Stair training;Functional mobility training;Therapeutic exercise;Balance training;Neuromuscular re-education;Cognitive remediation;Patient/family education;Orthotic Fit/Training;Wheelchair mobility training;Manual techniques;Passive range of motion;Energy  conservation;Joint Manipulations    PT Next Visit Plan  Update goals, progress note; Work on strength, sitting/standing balance, functional activities such as rolling, bed mobility, transfers; caregiver training    PT Home Exercise Plan  handout provided via Beth during previous sessions    Consulted and Agree with Plan of Care  Patient;Family member/caregiver    Family Member Consulted  daughter [name]       Patient will benefit from skilled therapeutic intervention in order to improve the following deficits and impairments:  Abnormal gait, Decreased activity tolerance, Decreased cognition, Decreased endurance, Decreased knowledge of use of DME, Decreased range of motion, Decreased skin integrity, Decreased strength, Impaired perceived functional ability, Impaired sensation, Impaired UE functional use, Improper body mechanics, Pain, Cardiopulmonary status limiting activity, Decreased balance, Decreased coordination, Decreased mobility, Difficulty walking, Impaired tone, Postural dysfunction  Visit Diagnosis: Muscle weakness (generalized)  Other lack of coordination  Difficulty in walking, not elsewhere classified     Problem List Patient Active Problem List   Diagnosis Date Noted  . Sepsis ([place]) 10/18/2018    Shaterrica Territo PT, DPT 12/31/2018, 8:28 AM  [place] MAIN [facility] [address] [place], [place], [address] Phone: [phone]   Fax:  [phone]  Name: Kenneth Summers MRN: [id number] Date of Birth: [birth year]

## 2018-12-31 NOTE — Therapy (Signed)
[place] MAIN [facility] SERVICES [address] [place], [place], [address] Phone: [phone]   Fax:  [phone]  Occupational Therapy Treatment  Patient Details  Name: Kenneth Summers MRN: [id number] Date of Birth: [birth year] No data recorded  Encounter Date: 12/30/2018  OT End of Session - 12/31/18 0746    Visit Number  72    Number of Visits  15    Date for OT Re-Evaluation  01/15/19    Authorization Type  Progress report period starting 12/26/2018    OT Start Time  1345    OT Stop Time  1430    OT Time Calculation (min)  45 min    Activity Tolerance  Patient tolerated treatment well;Patient limited by fatigue    Behavior During Therapy  [facility] for tasks assessed/performed       Past Medical History:  Diagnosis Date  . Alcohol abuse   . APS (antiphospholipid syndrome) ([place])   . BPH (benign prostatic hyperplasia)   . CAD (coronary artery disease)   . Cellulitis   . Cervical spinal stenosis   . Chronic osteomyelitis ([place])   . CKD (chronic kidney disease)   . CKD (chronic kidney disease)   . Clostridium difficile diarrhea   . Depression   . Diplopia   . Fatigue   . GERD (gastroesophageal reflux disease)   . Hyperlipemia   . Hypertension   . Hypovitaminosis D   . IBS (irritable bowel syndrome)   . 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 Insomnia   . Left lumbosacral radiculopathy   . Moderate obstructive sleep apnea   . Osteomyelitis of foot ([place])   . PAD (peripheral artery disease) ([place])   . Pruritus   . RA (rheumatoid arthritis) ([place])   . Radiculopathy   . Restless legs syndrome   . RLS (restless legs syndrome)   . SLE (systemic lupus erythematosus related syndrome) ([place])   . Stroke ([place])   . Toxic maculopathy of both eyes     Past Surgical History:  Procedure Laterality Date  . CARDIAC CATHETERIZATION    . CAROTID ENDARTERECTOMY    . CERVICAL LAMINECTOMY    . CORONARY ANGIOPLASTY    . FRACTURE SURGERY    . fusion C5-6-7    .  HERNIA REPAIR    . KNEE ARTHROSCOPY    . TONSILLECTOMY      There were no vitals filed for this visit.  Subjective Assessment - 12/31/18 0744    Subjective   Pt reported that he was pretty tired from PT session.    Patient is accompanied by:  Family member    Pertinent History  Pt. is a 73 y.o. 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 Pt. PMHx includes: Multiple Falls, Lupus, DJD, Renal Abscess, CVA. Pt. resides with his wife. Pt.'s wife and daughter assist with ADLs. Pt. has caregivers in  for 2 hours a day, 6 days a week. Pt. received Rehab services in acute care, at SNF for STR, and [place] services. Pt. is retired from [employer] for [employer] and [employer].    Patient Stated Goals  To regain use of his left UE, and do more for himself.    Currently in Pain?  No/denies      Neuromuscular re-ed: With moderate cues, pt educated in using vision to work on symmetry of moving LUE during bilateral motor integration and reaching task with 2# dowel to achieve elbow and shoulder  flexion and extension with diagonal patterns across body to reach target.  Pt did well for first 5 minutes and then started to fatigue and lean to left.  Improvement with control of trunk and correcting of posture this session.    Therapeutic exercise: Patient performed yellow theraband exercises with mod cues and hand over hand for proper movement pattern to complete 1 set of 10 for chest squeezes and BUE abduction and adduction while seated. Rest break needed for mild dizziness and pt praised for communicating this before vertigo set in.  Pt and daughter educated in exercises to complete at home in front of mirror for visual feedback for HEP and continue to work on strengthening.                     OT Education - 12/31/18 0745    Education Details  Theraband exercises to do at home incorporating trunk and abdominal muscles.    Person(s) Educated  Patient     Methods  Explanation;Demonstration    Comprehension  Returned demonstration;Tactile cues required;Verbalized understanding;Need further instruction          OT Long Term Goals - 12/25/18 1603      OT LONG TERM GOAL #1   Title  Pt. will increase left shoulder flexion AROM by 10 degrees to access cabinet/shelf.    Baseline  Pt. is improving with LUE ROM, and functional reaching    Time  12    Period  Weeks    Status  On-going    Target Date  01/15/19      OT LONG TERM GOAL #2   Title  Pt. will donn a shirt with Supervision.    Baseline  MinA    Time  12    Period  Weeks    Status  On-going    Target Date  01/15/19      OT LONG TERM GOAL #3   Title  Pt. will require ModA to perform LE dressing    Baseline  MaxA left, ModA with the right    Time  12    Period  Weeks    Status  On-going    Target Date  01/15/19      OT LONG TERM GOAL #4   Title  Pt. will improve left grip strength by 10# to be able to open a jar/container.    Baseline  Pt. has difficulty opening containers    Time  12    Period  Weeks    Status  On-going    Target Date  01/15/19      OT LONG TERM GOAL #5   Title  Pt. will improve left hand [name] skills to be able to assit with buttoning/zipping.    Baseline  Pt. has difficulty [facility] skills buttoning/zipping    Time  12    Period  Weeks    Status  On-going    Target Date  01/15/19      OT LONG TERM GOAL #6   Title  Pt. will demonstrate visual conmpensatory strategies during 100% of the time during ADLs    Baseline  Pt. requires cues at times for left sides awareness.    Time  12    Period  Weeks    Status  On-going    Target Date  01/15/19      OT LONG TERM GOAL #7   Title  Pt. will prepare a simple cold snack from w/c with supervision using cognitive  compensatory strategies 100% of the time.    Baseline  Pt. is unable    Time  12    Period  Weeks    Status  On-going    Target Date  01/15/19      OT LONG TERM GOAL #8   Title  Pt. will  accurately identify potential safety hazard using good safety awareness, and judgement 100% for ADLs, and IADLs.    Baseline  Limited    Time  12    Period  Weeks    Status  On-going    Target Date  01/15/20      OT LONG TERM GOAL  #9   TITLE  Pt. will  navigate his w/c around obstacles with Supervision and 100% accuracy    Baseline  Pt. requires cuing for obstacle on the left    Time  12    Period  Weeks    Status  On-going    Target Date  01/15/19            Plan - 12/31/18 0747    Clinical Impression Statement  Pt continues to make steady progress with UB strengthening while incorporating trunk and abdominal muscles to sit forward away from eleectric wheelchair for exercises to prevent anterior tilt.  Discussed recommendation to do exercises at home in front of a mirror to help with finding center and not lean to the left.  He was tired from PT session today and needed 2 rest breaks but was motivated to continue and push through as tolerated.  He continues to work on improving LUE functioning in order to increase independence in [place] and reduce caregiver burden.    OT Occupational Profile and History  Problem Focused Assessment - Including review of records relating to presenting problem    Occupational performance deficits (Please refer to evaluation for details):  ADL's    Body Structure / Function / Physical Skills  UE functional use;Coordination;FMC;Dexterity;Strength;ROM    Cognitive Skills  Attention;Memory;Emotional;Problem Solve;Safety Awareness    Clinical Decision Making  Several treatment options, min-mod task modification necessary    Comorbidities Affecting Occupational Performance:  Presence of comorbidities impacting occupational performance    Comorbidities impacting occupational performance description:  Phyical, cognitive, visual,  medical comorbidities    OT Frequency  3x / week    OT Duration  12 weeks    OT Treatment/Interventions  Self-care/ADL  training;DME and/or AE instruction;Therapeutic exercise;Therapeutic activities;Moist Heat;Cognitive remediation/compensation;Neuromuscular education;Visual/perceptual remediation/compensation;Coping strategies training;Patient/family education;Passive range of motion;Psychosocial skills training;Energy conservation;Functional Mobility Training    Consulted and Agree with Plan of Care  Patient;Family member/caregiver    Family Member Consulted  Daughter [name]       Patient will benefit from skilled therapeutic intervention in order to improve the following deficits and impairments:   Body Structure / Function / Physical Skills: UE functional use, Coordination, FMC, Dexterity, Strength, ROM Cognitive Skills: Attention, Memory, Emotional, Problem Solve, Safety Awareness     Visit Diagnosis: Muscle weakness (generalized)  Other lack of coordination  Hemiplegia and hemiparesis following cerebral infarction affecting left non-dominant side [facility])    Problem List Patient Active Problem List   Diagnosis Date Noted  . Sepsis [facility]) 10/18/2018    Chrys Racer, OTR/L, [facility] ascom [phone] 12/31/18, 7:59 AM  [place] MAIN [facility] SERVICES [address] [place], [place], [address] Phone: [phone]   Fax:  [phone]  Name: Kenneth Summers MRN: [id number] Date of Birth: [birth year]

## 2019-01-01 ENCOUNTER — Encounter: Payer: Self-pay | Admitting: Occupational Therapy

## 2019-01-01 ENCOUNTER — Ambulatory Visit: Payer: Medicare Other | Admitting: Occupational Therapy

## 2019-01-01 ENCOUNTER — Other Ambulatory Visit: Payer: Self-pay

## 2019-01-01 ENCOUNTER — Ambulatory Visit: Payer: Medicare Other | Admitting: Physical Therapy

## 2019-01-01 ENCOUNTER — Encounter: Payer: Self-pay | Admitting: Physical Therapy

## 2019-01-01 DIAGNOSIS — M6281 Muscle weakness (generalized): Secondary | ICD-10-CM

## 2019-01-01 DIAGNOSIS — R262 Difficulty in walking, not elsewhere classified: Secondary | ICD-10-CM

## 2019-01-01 DIAGNOSIS — R278 Other lack of coordination: Secondary | ICD-10-CM

## 2019-01-01 DIAGNOSIS — H547 Unspecified visual loss: Secondary | ICD-10-CM

## 2019-01-01 DIAGNOSIS — I69354 Hemiplegia and hemiparesis following cerebral infarction affecting left non-dominant side: Secondary | ICD-10-CM

## 2019-01-01 DIAGNOSIS — R41841 Cognitive communication deficit: Secondary | ICD-10-CM

## 2019-01-01 NOTE — Therapy (Addendum)
[place] MAIN [facility] SERVICES [address] [place], [place], [address] Phone: [phone]   Fax:  [phone]  Occupational Therapy Treatment  Patient Details  Name: Kenneth Summers MRN: [id number] Date of Birth: May 22, 1945 No data recorded  Encounter Date: 01/01/2019  OT End of Session - 01/03/19 1553    Visit Number  53    Number of Visits  39   Date for OT Re-Evaluation  01/15/19    Authorization Type  Progress report period starting 12/26/2018    OT Start Time  1347    OT Stop Time  1430    OT Time Calculation (min)  43 min    Activity Tolerance  Patient tolerated treatment well;Patient limited by fatigue    Behavior During Therapy  [facility] for tasks assessed/performed       Past Medical History:  Diagnosis Date  . Alcohol abuse   . APS (antiphospholipid syndrome) ([place])   . BPH (benign prostatic hyperplasia)   . CAD (coronary artery disease)   . Cellulitis   . Cervical spinal stenosis   . Chronic osteomyelitis ([place])   . CKD (chronic kidney disease)   . CKD (chronic kidney disease)   . Clostridium difficile diarrhea   . Depression   . Diplopia   . Fatigue   . GERD (gastroesophageal reflux disease)   . Hyperlipemia   . Hypertension   . Hypovitaminosis D   . IBS (irritable bowel syndrome)   . 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 Insomnia   . Left lumbosacral radiculopathy   . Moderate obstructive sleep apnea   . Osteomyelitis of foot ([place])   . PAD (peripheral artery disease) ([place])   . Pruritus   . RA (rheumatoid arthritis) ([place])   . Radiculopathy   . Restless legs syndrome   . RLS (restless legs syndrome)   . SLE (systemic lupus erythematosus related syndrome) ([place])   . Stroke ([place])   . Toxic maculopathy of both eyes     Past Surgical History:  Procedure Laterality Date  . CARDIAC CATHETERIZATION    . CAROTID ENDARTERECTOMY    . CERVICAL LAMINECTOMY    . CORONARY ANGIOPLASTY    . FRACTURE SURGERY    . fusion C5-6-7    .  HERNIA REPAIR    . KNEE ARTHROSCOPY    . TONSILLECTOMY      There were no vitals filed for this visit.  Subjective Assessment - 01/03/19 1550    Subjective   Patient reports he is tired but willing to work.  He is ready for Thanksgiving.  Patient reports this is the first time he has come alone for therapy.    Pertinent History  Pt. is a 73 y.o. 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 Pt. PMHx includes: Multiple Falls, Lupus, DJD, Renal Abscess, CVA. Pt. resides with his wife. Pt.'s wife and daughter assist with ADLs. Pt. has caregivers in  for 2 hours a day, 6 days a week. Pt. received Rehab services in acute care, at SNF for STR, and [place] services. Pt. is retired from [employer] for [employer] and [employer].    Patient Stated Goals  To regain use of his left UE, and do more for himself.    Currently in Pain?  Yes    Pain Score  3     Pain Location  Leg    Pain Orientation  Left    Pain Descriptors / Indicators  Aching;Sore    Pain Type  Acute pain    Pain Onset  Yesterday    Pain Frequency  Intermittent    Multiple Pain Sites  No        Patient seen this date for LUE reaching and ROM with use of shape tower, patient able to complete first 3 levels with cues and occasional min assist to move items from 3rd level.  Increased time to complete task and rest breaks as needed. Cues for weight shifting and posture while reaching.  [place] discs with left hand to pick up, place all into grid and then work on performing flipping/turning with left hand.  Cues for prehension patterns and to not allow disc to touch the table (propping disc to turn) but to use fingers to perform task.  Cues at times for posture during task.  Response to tx:   Patient continues to make good progress towards goals.  He does require rest breaks during session especially with increased reach on the left.  Patient tends to lean left at times and responds well to cues.  Slow  to complete manipulation skills but is able to demonstrate increased isolated finger movements and manipulation skills.  Continue OT to maximize safety and independence in daily tasks.                     OT Education - 01/03/19 1551    Education Details  navigating in hall with wheelchair    Methods  Explanation;Demonstration    Comprehension  Returned demonstration;Tactile cues required;Verbalized understanding;Need further instruction          OT Long Term Goals - 12/25/18 1603      OT LONG TERM GOAL #1   Title  Pt. will increase left shoulder flexion AROM by 10 degrees to access cabinet/shelf.    Baseline  Pt. is improving with LUE ROM, and functional reaching    Time  12    Period  Weeks    Status  On-going    Target Date  01/15/19      OT LONG TERM GOAL #2   Title  Pt. will donn a shirt with Supervision.    Baseline  MinA    Time  12    Period  Weeks    Status  On-going    Target Date  01/15/19      OT LONG TERM GOAL #3   Title  Pt. will require ModA to perform LE dressing    Baseline  MaxA left, ModA with the right    Time  12    Period  Weeks    Status  On-going    Target Date  01/15/19      OT LONG TERM GOAL #4   Title  Pt. will improve left grip strength by 10# to be able to open a jar/container.    Baseline  Pt. has difficulty opening containers    Time  12    Period  Weeks    Status  On-going    Target Date  01/15/19      OT LONG TERM GOAL #5   Title  Pt. will improve left hand [facility] skills to be able to assit with buttoning/zipping.    Baseline  Pt. has difficulty [facility] skills buttoning/zipping    Time  12    Period  Weeks    Status  On-going    Target Date  01/15/19      OT LONG TERM GOAL #  6   Title  Pt. will demonstrate visual conmpensatory strategies during 100% of the time during ADLs    Baseline  Pt. requires cues at times for left sides awareness.    Time  12    Period  Weeks    Status  On-going    Target Date  01/15/19       OT LONG TERM GOAL #7   Title  Pt. will prepare a simple cold snack from w/c with supervision using cognitive compensatory strategies 100% of the time.    Baseline  Pt. is unable    Time  12    Period  Weeks    Status  On-going    Target Date  01/15/19      OT LONG TERM GOAL #8   Title  Pt. will accurately identify potential safety hazard using good safety awareness, and judgement 100% for ADLs, and IADLs.    Baseline  Limited    Time  12    Period  Weeks    Status  On-going    Target Date  01/15/20      OT LONG TERM GOAL  #9   TITLE  Pt. will  navigate his w/c around obstacles with Supervision and 100% accuracy    Baseline  Pt. requires cuing for obstacle on the left    Time  12    Period  Weeks    Status  On-going    Target Date  01/15/19            Plan - 01/03/19 1553    Clinical Impression Statement  Patient continues to make good progress towards goals.  He does require rest breaks during session especially with increased reach on the left.  Patient tends to lean left at times and responds well to cues.  Slow to complete manipulation skills but is able to demonstrate increased isolated finger movements and manipulation skills.  Continue OT to maximize safety and independence in daily tasks.    OT Occupational Profile and History  Problem Focused Assessment - Including review of records relating to presenting problem    Occupational performance deficits (Please refer to evaluation for details):  ADL's    Body Structure / Function / Physical Skills  UE functional use;Coordination;FMC;Dexterity;Strength;ROM    Cognitive Skills  Attention;Memory;Emotional;Problem Solve;Safety Awareness    Rehab Potential  Good    Clinical Decision Making  Several treatment options, min-mod task modification necessary    Comorbidities Affecting Occupational Performance:  Presence of comorbidities impacting occupational performance    Comorbidities impacting occupational performance  description:  Phyical, cognitive, visual,  medical comorbidities    Modification or Assistance to Complete Evaluation   No modification of tasks or assist necessary to complete eval    OT Frequency  3x / week    OT Duration  12 weeks    OT Treatment/Interventions  Self-care/ADL training;DME and/or AE instruction;Therapeutic exercise;Therapeutic activities;Moist Heat;Cognitive remediation/compensation;Neuromuscular education;Visual/perceptual remediation/compensation;Coping strategies training;Patient/family education;Passive range of motion;Psychosocial skills training;Energy conservation;Functional Mobility Training    Consulted and Agree with Plan of Care  Patient       Patient will benefit from skilled therapeutic intervention in order to improve the following deficits and impairments:   Body Structure / Function / Physical Skills: UE functional use, Coordination, FMC, Dexterity, Strength, ROM Cognitive Skills: Attention, Memory, Emotional, Problem Solve, Safety Awareness     Visit Diagnosis: Muscle weakness (generalized)  Other lack of coordination  Hemiplegia and hemiparesis following cerebral infarction affecting left non-dominant side (  [place])  Cognitive communication deficit  Vision impairment    Problem List Patient Active Problem List   Diagnosis Date Noted  . Sepsis ([place]) 10/18/2018   Amy T Lovett, OTR/L, CLT  Lovett,Amy 01/03/2019, 3:59 PM  [place] MAIN [facility] SERVICES [address] [place], [place], [address] Phone: [phone]   Fax:  [phone]  Name: Kenneth Summers MRN: [id number] Date of Birth: 1945-07-21

## 2019-01-01 NOTE — Therapy (Signed)
[place] MAIN [facility] SERVICES [address] [place], [place], [address] Phone: [phone]   Fax:  [phone]  Physical Therapy Treatment  Patient Details  Name: Kenneth Summers MRN: [id number] Date of Birth: [birth year] Referring Provider (PT): Dr. [name], Guy Begin   Encounter Date: 01/01/2019  PT End of Session - 01/01/19 1313    Visit Number  [id number]    Number of Visits  108    Date for PT Re-Evaluation  02/27/19    Authorization Type  Medicare reporting period starting 09/11/18    Authorization Time Period  goals last assessed 12/04/18    PT Start Time  1302    PT Stop Time  1345    PT Time Calculation (min)  43 min    Equipment Utilized During Treatment  Gait belt    Activity Tolerance  Patient tolerated treatment well;Patient limited by fatigue    Behavior During Therapy  [facility] for tasks assessed/performed       Past Medical History:  Diagnosis Date  . Alcohol abuse   . APS (antiphospholipid syndrome) ([place])   . BPH (benign prostatic hyperplasia)   . CAD (coronary artery disease)   . Cellulitis   . Cervical spinal stenosis   . Chronic osteomyelitis ([place])   . CKD (chronic kidney disease)   . CKD (chronic kidney disease)   . Clostridium difficile diarrhea   . Depression   . Diplopia   . Fatigue   . GERD (gastroesophageal reflux disease)   . Hyperlipemia   . Hypertension   . Hypovitaminosis D   . IBS (irritable bowel syndrome)   . 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 Insomnia   . Left lumbosacral radiculopathy   . Moderate obstructive sleep apnea   . Osteomyelitis of foot ([place])   . PAD (peripheral artery disease) ([place])   . Pruritus   . RA (rheumatoid arthritis) ([place])   . Radiculopathy   . Restless legs syndrome   . RLS (restless legs syndrome)   . SLE (systemic lupus erythematosus related syndrome) ([place])   . Stroke ([place])   . Toxic maculopathy of both eyes     Past Surgical History:  Procedure Laterality Date  . CARDIAC  CATHETERIZATION    . CAROTID ENDARTERECTOMY    . CERVICAL LAMINECTOMY    . CORONARY ANGIOPLASTY    . FRACTURE SURGERY    . fusion C5-6-7    . HERNIA REPAIR    . KNEE ARTHROSCOPY    . TONSILLECTOMY      There were no vitals filed for this visit.  Subjective Assessment - 01/01/19 1309    Subjective  Patient reports doing well; no new falls; Denies any pain; Reports adherence with HEP; He reports being able to sit edge of bed today and gave himself a sponge bath.    Patient is accompained by:  Family member    Pertinent History  Patient is a 73 y.o. male who presents to outpatient physical therapy with a referral for medical diagnosis of CVA. This patient's chief complaints consist of left hemiplegia and overall deconditioning leading to the following functional deficits: dependent for ADLs, IADLs, unable to transfer to car, dependent transfers at home with hoyer lift, unable to walk or stand without significant assistance, difficulty with W/C navigation..    Limitations  Sitting;Lifting;Standing;House hold activities;Writing;Walking;Other (comment)    Diagnostic tests  Brain MRI 11/01/2017: IMPRESSION: 1. Acute right ACA territory infarct as demonstrated on prior CT imaging. No intracranial hemorrhage  or significant mass effect. 2. 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    Currently in Pain?  Yes    Pain Score  3     Pain Location  Leg    Pain Orientation  Left    Pain Descriptors / Indicators  Aching;Sore    Pain Type  Acute pain    Pain Onset  Yesterday    Pain Frequency  Intermittent    Aggravating Factors   tender    Pain Relieving Factors  stretch/activity    Effect of Pain on Daily Activities  no change in activity;    Multiple Pain Sites  No                TREATMENT: Patient transferred squat pivot wheelchair<>nustep with BUE and therapist assistance, min A +1;Patient does require increased time for proper positioning and set up  but was able to exhibit good safety with transfer; He requires cues for walking LLE foot around and required min A for placing LLE foot onto foot plate;   Following therapeutic activity: Instructed patient in Nustep, seat #15, arms #14 Pt instructed in BUE/BLE level 3x81mnwith cues to increase speed as fast as possible, increasing steps per minute to>70, Then reduced to BLE onlylevel 3 x318m with cues to increase speed to >50 steps per minute, patient averaged45-55steps per minute, HR was102bpm following exercise; Reports increased shortness of breath and fatigue;  Patient completed 0.18 miles total distance; He required intermittent rest breaks after 5 min due to increased shortness of breath and fatigue;   Following therapeutic exercise: Transferred nustep to wheelchair squat pivotwith mod A +1; Patient required cues for hand placement and positioning. He had increased difficulty pivoting to wheelchair likely due to fatigue;  Patient transferred squat pivot wheelchair to mat table with therapist assist (min A +1) adjusting wheelchair and mat table to optimal height for easiest transfer. Patient Then transitioned sitting to supine with mod A +1 for lifting legs onto table and controlling trunk positioning;   Patient able to scoot to midline with legs bent  Following therapeutic activity: Patient hooklying: -lumbar trunk rotation x5 reps bilaterally x2 sets with min A and mod VCs to keep knees together for good hip stretches -red tband around BLE: hip flexion march x15 bilaterally with AAROM on LLE -red tband around BLE hip abduction/adduction to midline x15 with cues to increase LLE AROM -bridges x15 reps with therapist providing assistance to LLE for better positioning; Patient does report mild back pain with end range of motion;  -SLR hip flexion x10 reps AROM on RLE, AAROM on LLE with cues to keep knee straight for better stabilization and motor control;   Patient  instructed in rolling to right side requiring increased time and cues for LE/UE positioning;  Sidelying: PT performed passive LLE quad stretch 30 sec hold x3 reps; PT performed passive LLE hip flexion/extension ROM x5 reps PT performed passive LLE hamstring stretch with ankle DF 20 sec hold x2 reps; Patient tolerated stretches fair. He does report increased pull at end range.   Following exercise: Patient required min A for transition sidelying to sitting edge of bed with cues for UE/LE positioning;  Upon sitting, patient exhibits heavy lean to right side requiring cues to reorient to midline;  Patient transferred squat pivot mat table to wheelchair mod A +2 with increased time for proper positioning and cues for hand/leg placement;  He required mod A for repositioning in chair at end of  session. Reinforced HEP;    Patient tolerated session fair. He was heavily fatigued this session requiring increased time for motor planning to improve LLE muscle activation; Patient tolerated exercises well reporting less pain at end of session;               PT Education - 01/01/19 1311    Education Details  LE strengthening, transfers. HEP    Person(s) Educated  Patient    Methods  Explanation;Verbal cues    Comprehension  Verbalized understanding;Returned demonstration;Verbal cues required;Need further instruction       PT Short Term Goals - 12/04/18 1303      PT SHORT TERM GOAL #1   Title  Be independent with initial home exercise program for self-management of symptoms.    Baseline  HEP to be given at second session; advice to practice unsupported sitting at home (06/19/2018); 07/08/18: Performing at home, 6/29: needs assistance but is doing exercise program regularly; 08/22/18: adherent;    Time  2    Period  Weeks    Status  Achieved    Target Date  06/26/18        PT Long Term Goals - 12/04/18 1303      PT LONG TERM GOAL #1   Title  Patient will complete all bed mobility  with min A to improve functional independence for getting in and out of bed and adjusting in bed.     Baseline  max A - total A reported by family (06/19/2018); 07/08/18: still requires maxA, dtr reports improvement in rolling since starting therapy; 6/23: min A to supervision in clinic; not doing at home right; 8/5: Pt's daughter states that his rolling has improved, but still has difficulty with all other bed mobility; 10/23/18: pts daughter states that she is doing about 35% of the work if he is in proper position, he does uses the bed rails to help roll;  using Hoyer for coming to sit EOB; 10/12: pts daughter states that she is doing about 30% of the work if he is in proper position, he does uses the bed rails to help roll, but it is improving;  using Hoyer for coming to sit EOB, 10/29: min A for sit to sidelying, rolling supervision, able to transition sidelying to sit with HHA;    Time  12    Period  Weeks    Status  Achieved      PT LONG TERM GOAL #2   Title  Patient will complete sit <> stand transfer chair to chair with Min A and LRAD to improve functional independence for household and community mobility.     Baseline  max A +2 STS at W/C (06/12/2018); max A +2-3 STS or stand pivot transfer, able to complete more reps (06/19/2018); 07/08/18: not safe to attempt at this time; 09/11/18: requires maxA+1 or modA+2 for STSs and squat pivot transfer; 10/23/18: requires maxA+1 or modA+2 for STSs and squat pivot transfer; 10/12: requires maxA+1 or modA+2 for STSs and squat pivot transfer; 10/29: patient able to complete sit<>Stand transfer min A +1 with parallel bars and RW, although inconsistent, mod A for stand pivot;    Time  12    Period  Weeks    Status  Partially Met    Target Date  02/27/19      PT LONG TERM GOAL #3   Title  Patient will navigate power w/c with min A x 100 feet to improve mobility for household and short community distances.  Baseline  required total A (06/12/2018); able to roll 20 feet  with min A (06/20/2018); 07/08/18: Pt can go approximately 10' without assist per family;  10/23/2018: pt's daughter states that his power WC mobility has become 100% better with the new hand control, slowed speed, and with practice; 10/12: pt's daughter reports that he can perform household WC mobility and limited community ambulation with minA    Time  12    Period  Weeks    Status  Achieved      PT LONG TERM GOAL #4   Title  Patient will complete car transfers W/C to family SUV with min A using LRAD to improve his abilty to participate in community activities.     Baseline  unable to complete car transfers at all (06/12/2018); unable to complete car transfers at all (06/19/2018).; 07/08/18: Unable to perform at this time; 09/11/18: unable to perform at this time; 10/23/2018: unable to complete car transfers at all; 10/12:  unable to complete car transfers at all, 10/29: unable    Time  12    Period  Weeks    Status  On-going    Target Date  02/27/19      PT LONG TERM GOAL #5   Title  Patient will ambulate at least 150 feet with mod I using LRAD to improve mobility for household and community distances.     Baseline  unable to take steps (06/12/2018); able to shift R leg forward and back with max A and RW at edge of plinth (06/19/2018); 07/08/18: unable to ambulate at this time. 09/11/18: unable to perform at this time; 10/23/2018: unable to perform at this time; 10/12: unable to perform at this time, 10/29: unable at this time    Time  12    Period  Weeks    Status  On-going    Target Date  02/27/19      Additional Long Term Goals   Additional Long Term Goals  Yes      PT LONG TERM GOAL #6   Title  Patient will be able to safety navigate up/down ramp with power chair, exhibiting good safety awareness, mod I to safely enter/exit his home.    Baseline  unable to complet stairs (06/12/2018); unable to complete stairs (06/19/2018); 07/08/18: unable to attempt at this time; 09/11/18: unsafe to attempt at this time;  10/23/2018: unable to complet stairs (06/12/2018); unable to complete stairs (06/19/2018); 07/08/18: unable to attempt at this time; 09/11/18: unsafe to attempt at this time; 10/23/18: unsafe to attempt at this time, 10/29: requires superivison and occasional min A for safety    Time  12    Period  Weeks    Status  Revised    Target Date  02/27/19      PT LONG TERM GOAL #7   Title  Patient will increase functional reach test to >15 inches in sitting to exhibit improved sitting balance and positioning and reduce fall risk;    Baseline  07/30/18: 12 inches; 09/11/18: 10-12 inches; 10/23/18: 12-13 inch; 10/12: 14.5", 10/29: 15.5 inch    Time  4    Period  Weeks    Status  Achieved      PT LONG TERM GOAL #8   Title  Patient will increase LLE quad strength to 3+/5 to improve functional strength for standing and mobility;    Baseline  10/28: 2+/5    Time  12    Period  Weeks    Status  New    Target Date  02/27/19            Plan - 01/01/19 1406    Clinical Impression Statement  Patient motivated and tolerated session fair. He was heavily fatigued this session requiring increased assistance for transfers and increased time for LE muscle activation. Patient also exhibited decreased trunk control with increased lateral lean this session. He required min-mod A for most transfers and with all bed mobility. Patient requires cues for proper exercise technique and often requires min A for LLE positioning for optimal muscle activation. Patient would benefit from additional skilled PT intervention to improve strength, balance and mobility;    Personal Factors and Comorbidities  Age;Comorbidity 3+    Comorbidities  Relevant past medical history and comorbidities include long term steroid use for lupus, CKD, chronic osteomyelitis, cervical spine stenosis, BPH, APS, alcohol abuse, peripheral artery disease, depression, GERD, obstructive sleep apnea, HTN, IBS, IgA deficiency, lumbar radiculopathy, RA, Stroke, toxic  maculopathy in both eyes, systemic lupus erythematosus related syndrome, cardiac catheterization, carotid endarterectomy, cervical laminectomy, coronary angioplasty, Fusion C5-C7, knee arthroplasty, lumbar surgery.    Examination-Activity Limitations  Bed Mobility;Bend;Sit;Toileting;Stand;Stairs;Lift;Transfers;Squat;Locomotion Level;Carry;Dressing;Hygiene/Grooming;Continence    Examination-Participation Restrictions  Yard Work;Interpersonal Relationship;Community Activity    Stability/Clinical Decision Making  Evolving/Moderate complexity    Rehab Potential  Fair    PT Frequency  3x / week    PT Duration  12 weeks    PT Treatment/Interventions  ADLs/Self Care Home Management;Electrical Stimulation;Moist Heat;Cryotherapy;Gait training;Stair training;Functional mobility training;Therapeutic exercise;Balance training;Neuromuscular re-education;Cognitive remediation;Patient/family education;Orthotic Fit/Training;Wheelchair mobility training;Manual techniques;Passive range of motion;Energy conservation;Joint Manipulations    PT Next Visit Plan  Update goals, progress note; Work on strength, sitting/standing balance, functional activities such as rolling, bed mobility, transfers; caregiver training    PT Home Exercise Plan  handout provided via Beth during previous sessions    Consulted and Agree with Plan of Care  Patient;Family member/caregiver    Family Member Consulted  daughter [name]       Patient will benefit from skilled therapeutic intervention in order to improve the following deficits and impairments:  Abnormal gait, Decreased activity tolerance, Decreased cognition, Decreased endurance, Decreased knowledge of use of DME, Decreased range of motion, Decreased skin integrity, Decreased strength, Impaired perceived functional ability, Impaired sensation, Impaired UE functional use, Improper body mechanics, Pain, Cardiopulmonary status limiting activity, Decreased balance, Decreased coordination,  Decreased mobility, Difficulty walking, Impaired tone, Postural dysfunction  Visit Diagnosis: Muscle weakness (generalized)  Other lack of coordination  Difficulty in walking, not elsewhere classified     Problem List Patient Active Problem List   Diagnosis Date Noted  . Sepsis ([place]) 10/18/2018    Chaye Misch PT, DPT 01/01/2019, 2:08 PM  [place] MAIN [facility] SERVICES [address] [place], [place], [address] Phone: [phone]   Fax:  [phone]  Name: Kenneth Summers MRN: [id number] Date of Birth: [birth year]

## 2019-01-06 ENCOUNTER — Other Ambulatory Visit: Payer: Self-pay

## 2019-01-06 ENCOUNTER — Ambulatory Visit: Payer: Medicare Other | Admitting: Physical Therapy

## 2019-01-06 ENCOUNTER — Encounter: Payer: Self-pay | Admitting: Occupational Therapy

## 2019-01-06 ENCOUNTER — Encounter: Payer: Self-pay | Admitting: Physical Therapy

## 2019-01-06 ENCOUNTER — Ambulatory Visit: Payer: Medicare Other | Admitting: Occupational Therapy

## 2019-01-06 DIAGNOSIS — R278 Other lack of coordination: Secondary | ICD-10-CM

## 2019-01-06 DIAGNOSIS — M6281 Muscle weakness (generalized): Secondary | ICD-10-CM

## 2019-01-06 DIAGNOSIS — R262 Difficulty in walking, not elsewhere classified: Secondary | ICD-10-CM

## 2019-01-06 NOTE — Therapy (Signed)
[place] MAIN [facility] SERVICES [address] [place], [place], [address] Phone: [phone]   Fax:  [phone]  Physical Therapy Treatment  Patient Details  Name: Kenneth Summers MRN: [id number] Date of Birth: 02-Sep-1945 Referring Provider (PT): Dr. [name], Guy Begin   Encounter Date: 01/06/2019  PT End of Session - 01/06/19 1315    Visit Number  18    Number of Visits  108    Date for PT Re-Evaluation  02/27/19    Authorization Type  Medicare reporting period starting 09/11/18    Authorization Time Period  goals last assessed 12/04/18    PT Start Time  1302    PT Stop Time  1345    PT Time Calculation (min)  43 min    Equipment Utilized During Treatment  Gait belt    Activity Tolerance  Patient tolerated treatment well;Patient limited by fatigue    Behavior During Therapy  [facility] for tasks assessed/performed       Past Medical History:  Diagnosis Date  . Alcohol abuse   . APS (antiphospholipid syndrome) ([place])   . BPH (benign prostatic hyperplasia)   . CAD (coronary artery disease)   . Cellulitis   . Cervical spinal stenosis   . Chronic osteomyelitis ([place])   . CKD (chronic kidney disease)   . CKD (chronic kidney disease)   . Clostridium difficile diarrhea   . Depression   . Diplopia   . Fatigue   . GERD (gastroesophageal reflux disease)   . Hyperlipemia   . Hypertension   . Hypovitaminosis D   . IBS (irritable bowel syndrome)   . 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 Insomnia   . Left lumbosacral radiculopathy   . Moderate obstructive sleep apnea   . Osteomyelitis of foot ([place])   . PAD (peripheral artery disease) ([place])   . Pruritus   . RA (rheumatoid arthritis) ([place])   . Radiculopathy   . Restless legs syndrome   . RLS (restless legs syndrome)   . SLE (systemic lupus erythematosus related syndrome) ([place])   . Stroke ([place])   . Toxic maculopathy of both eyes     Past Surgical History:  Procedure Laterality Date  . CARDIAC  CATHETERIZATION    . CAROTID ENDARTERECTOMY    . CERVICAL LAMINECTOMY    . CORONARY ANGIOPLASTY    . FRACTURE SURGERY    . fusion C5-6-7    . HERNIA REPAIR    . KNEE ARTHROSCOPY    . TONSILLECTOMY      There were no vitals filed for this visit.  Subjective Assessment - 01/06/19 1314    Subjective  Patient reports doing well; Denies any new falls; Reports trying to get in the car over the weekend but was having a hard time standing. He reports minimal fatigue and no soreness.    Patient is accompained by:  Family member    Pertinent History  Patient is a 73 y.o. male who presents to outpatient physical therapy with a referral for medical diagnosis of CVA. This patient's chief complaints consist of left hemiplegia and overall deconditioning leading to the following functional deficits: dependent for ADLs, IADLs, unable to transfer to car, dependent transfers at home with hoyer lift, unable to walk or stand without significant assistance, difficulty with W/C navigation..    Limitations  Sitting;Lifting;Standing;House hold activities;Writing;Walking;Other (comment)    Diagnostic tests  Brain MRI 11/01/2017: IMPRESSION: 1. Acute right ACA territory infarct as demonstrated on prior CT imaging. No  intracranial hemorrhage or significant mass effect. 2. 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    Currently in Pain?  No/denies    Pain Onset  Yesterday            TREATMENT: Patient transferred squat pivot wheelchair<>nustep with BUE and therapist assistance, min A +1 with daughter providing HHA as needed;Patient does require increased time for proper positioning and set up but was able to exhibit good safety with transfer; He requires cues for walking LLE foot around and required min A for placing LLE foot onto foot plate;   Following therapeutic activity: Instructed patient in Nustep, seat #15, arms #14 Pt instructed in BUE/BLE level 3x34mnwith cues to  increase speed as fast as possible, increasing steps per minute to>70, Then reduced to BLE onlylevel 3 x43m with cues to increase speed to >50 steps per minute, patient averaged45-55steps per minute, HR was102bpm following exercise; Reports increased shortness of breath and fatigue;  Patient completed 0.18 miles total distance;   Following therapeutic exercise: Transferred nustep to wheelchair squat pivotwith min A +2 with daughter assisting for safety; Patient required cues for hand placement and positioning. He had increased difficulty pivoting to wheelchair likely due to fatigue;  Patient is improving in LE AROM being able to place LLE foot on leg rest with cues and increased time;   Patient sitting in power chair with power chair elevated and tilted forward for better positioning: (seat height 27-28 inches from floor) Sit<>stand transfersinside parallel bars:  x4 reps;  During 1st attempt, he requires min A +1 for blocking left knee but otherwise patient able to pull self forward and stand with close supervision on first rep;  During 2-4 attempts he was able to transfer with close supervision without physical assistance.  He requires increased time and effort with mod VCs for forward weight shift and proper positioning. Upon standing patient able to hold position with close supervision with intermittent cues for proper weight shift. Patient able to hold standing position for 10-30 sec with increased effort and fatigue; He required cues to shift weight to left side for better neutral stance, often leaning to right side upon initial standing;   With increased repetition patient expressed increased fatigue in BUE  He required min A for repositioning in chair at end of session. Reinforced HEP;                      PT Education - 01/06/19 1315    Education Details  LE strengthening, transfers, HEP    Person(s) Educated  Patient;Child(ren)    Methods   Explanation;Demonstration;Verbal cues    Comprehension  Verbalized understanding;Returned demonstration;Verbal cues required;Need further instruction       PT Short Term Goals - 12/04/18 1303      PT SHORT TERM GOAL #1   Title  Be independent with initial home exercise program for self-management of symptoms.    Baseline  HEP to be given at second session; advice to practice unsupported sitting at home (06/19/2018); 07/08/18: Performing at home, 6/29: needs assistance but is doing exercise program regularly; 08/22/18: adherent;    Time  2    Period  Weeks    Status  Achieved    Target Date  06/26/18        PT Long Term Goals - 12/04/18 1303      PT LONG TERM GOAL #1   Title  Patient will complete all bed mobility with min A  to improve functional independence for getting in and out of bed and adjusting in bed.     Baseline  max A - total A reported by family (06/19/2018); 07/08/18: still requires maxA, dtr reports improvement in rolling since starting therapy; 6/23: min A to supervision in clinic; not doing at home right; 8/5: Pt's daughter states that his rolling has improved, but still has difficulty with all other bed mobility; 10/23/18: pts daughter states that she is doing about 35% of the work if he is in proper position, he does uses the bed rails to help roll;  using Hoyer for coming to sit EOB; 10/12: pts daughter states that she is doing about 30% of the work if he is in proper position, he does uses the bed rails to help roll, but it is improving;  using Hoyer for coming to sit EOB, 10/29: min A for sit to sidelying, rolling supervision, able to transition sidelying to sit with HHA;    Time  12    Period  Weeks    Status  Achieved      PT LONG TERM GOAL #2   Title  Patient will complete sit <> stand transfer chair to chair with Min A and LRAD to improve functional independence for household and community mobility.     Baseline  max A +2 STS at W/C (06/12/2018); max A +2-3 STS or stand  pivot transfer, able to complete more reps (06/19/2018); 07/08/18: not safe to attempt at this time; 09/11/18: requires maxA+1 or modA+2 for STSs and squat pivot transfer; 10/23/18: requires maxA+1 or modA+2 for STSs and squat pivot transfer; 10/12: requires maxA+1 or modA+2 for STSs and squat pivot transfer; 10/29: patient able to complete sit<>Stand transfer min A +1 with parallel bars and RW, although inconsistent, mod A for stand pivot;    Time  12    Period  Weeks    Status  Partially Met    Target Date  02/27/19      PT LONG TERM GOAL #3   Title  Patient will navigate power w/c with min A x 100 feet to improve mobility for household and short community distances.    Baseline  required total A (06/12/2018); able to roll 20 feet with min A (06/20/2018); 07/08/18: Pt can go approximately 10' without assist per family;  10/23/2018: pt's daughter states that his power WC mobility has become 100% better with the new hand control, slowed speed, and with practice; 10/12: pt's daughter reports that he can perform household WC mobility and limited community ambulation with minA    Time  12    Period  Weeks    Status  Achieved      PT LONG TERM GOAL #4   Title  Patient will complete car transfers W/C to family SUV with min A using LRAD to improve his abilty to participate in community activities.     Baseline  unable to complete car transfers at all (06/12/2018); unable to complete car transfers at all (06/19/2018).; 07/08/18: Unable to perform at this time; 09/11/18: unable to perform at this time; 10/23/2018: unable to complete car transfers at all; 10/12:  unable to complete car transfers at all, 10/29: unable    Time  12    Period  Weeks    Status  On-going    Target Date  02/27/19      PT LONG TERM GOAL #5   Title  Patient will ambulate at least 150 feet with mod I  using LRAD to improve mobility for household and community distances.     Baseline  unable to take steps (06/12/2018); able to shift R leg forward and  back with max A and RW at edge of plinth (06/19/2018); 07/08/18: unable to ambulate at this time. 09/11/18: unable to perform at this time; 10/23/2018: unable to perform at this time; 10/12: unable to perform at this time, 10/29: unable at this time    Time  12    Period  Weeks    Status  On-going    Target Date  02/27/19      Additional Long Term Goals   Additional Long Term Goals  Yes      PT LONG TERM GOAL #6   Title  Patient will be able to safety navigate up/down ramp with power chair, exhibiting good safety awareness, mod I to safely enter/exit his home.    Baseline  unable to complet stairs (06/12/2018); unable to complete stairs (06/19/2018); 07/08/18: unable to attempt at this time; 09/11/18: unsafe to attempt at this time; 10/23/2018: unable to complet stairs (06/12/2018); unable to complete stairs (06/19/2018); 07/08/18: unable to attempt at this time; 09/11/18: unsafe to attempt at this time; 10/23/18: unsafe to attempt at this time, 10/29: requires superivison and occasional min A for safety    Time  12    Period  Weeks    Status  Revised    Target Date  02/27/19      PT LONG TERM GOAL #7   Title  Patient will increase functional reach test to >15 inches in sitting to exhibit improved sitting balance and positioning and reduce fall risk;    Baseline  07/30/18: 12 inches; 09/11/18: 10-12 inches; 10/23/18: 12-13 inch; 10/12: 14.5", 10/29: 15.5 inch    Time  4    Period  Weeks    Status  Achieved      PT LONG TERM GOAL #8   Title  Patient will increase LLE quad strength to 3+/5 to improve functional strength for standing and mobility;    Baseline  10/28: 2+/5    Time  12    Period  Weeks    Status  New    Target Date  02/27/19            Plan - 01/06/19 1433    Clinical Impression Statement  Patient motivated and participated well within session. He does get fatigued while on Nustep but continues to exhibit good LE muscle activation. Patient requires frequent cues for attention to task as he  is easily distracted. He was able to self correct postural control while sitting on nustep this session requiring less cues for trunk control. Patient progressing well with transfers being able to stand with supervision with parallel bars. He does require cues for weight shift to neutral, often leaning to RLE. Patient able to exhibit increased hip/knee extension in standing this session with less posterior lean. He does fatigue quickly being able to hold standing position for only 10-30 sec each repetition. He would benefit from additional skilled PT intervention to improve strength, balance and mobility; Plan to work on transfers with RW next session;    Personal Factors and Comorbidities  Age;Comorbidity 3+    Comorbidities  Relevant past medical history and comorbidities include long term steroid use for lupus, CKD, chronic osteomyelitis, cervical spine stenosis, BPH, APS, alcohol abuse, peripheral artery disease, depression, GERD, obstructive sleep apnea, HTN, IBS, IgA deficiency, lumbar radiculopathy, RA, Stroke, toxic maculopathy in both eyes,  systemic lupus erythematosus related syndrome, cardiac catheterization, carotid endarterectomy, cervical laminectomy, coronary angioplasty, Fusion C5-C7, knee arthroplasty, lumbar surgery.    Examination-Activity Limitations  Bed Mobility;Bend;Sit;Toileting;Stand;Stairs;Lift;Transfers;Squat;Locomotion Level;Carry;Dressing;Hygiene/Grooming;Continence    Examination-Participation Restrictions  Yard Work;Interpersonal Relationship;Community Activity    Stability/Clinical Decision Making  Evolving/Moderate complexity    Rehab Potential  Fair    PT Frequency  3x / week    PT Duration  12 weeks    PT Treatment/Interventions  ADLs/Self Care Home Management;Electrical Stimulation;Moist Heat;Cryotherapy;Gait training;Stair training;Functional mobility training;Therapeutic exercise;Balance training;Neuromuscular re-education;Cognitive remediation;Patient/family  education;Orthotic Fit/Training;Wheelchair mobility training;Manual techniques;Passive range of motion;Energy conservation;Joint Manipulations    PT Next Visit Plan  Update goals, progress note; Work on strength, sitting/standing balance, functional activities such as rolling, bed mobility, transfers; caregiver training    PT Home Exercise Plan  handout provided via Beth during previous sessions    Consulted and Agree with Plan of Care  Patient;Family member/caregiver    Family Member Consulted  daughter [name]       Patient will benefit from skilled therapeutic intervention in order to improve the following deficits and impairments:  Abnormal gait, Decreased activity tolerance, Decreased cognition, Decreased endurance, Decreased knowledge of use of DME, Decreased range of motion, Decreased skin integrity, Decreased strength, Impaired perceived functional ability, Impaired sensation, Impaired UE functional use, Improper body mechanics, Pain, Cardiopulmonary status limiting activity, Decreased balance, Decreased coordination, Decreased mobility, Difficulty walking, Impaired tone, Postural dysfunction  Visit Diagnosis: Muscle weakness (generalized)  Other lack of coordination  Difficulty in walking, not elsewhere classified     Problem List Patient Active Problem List   Diagnosis Date Noted  . Sepsis ([place]) 10/18/2018    Trotter,Margaret PT, DPT 01/06/2019, 6:53 PM  [place] MAIN [facility] SERVICES [address] [place], [place], [address] Phone: [phone]   Fax:  [phone]  Name: SHAHAB POLHAMUS MRN: [id number] Date of Birth: 19-Feb-1945

## 2019-01-06 NOTE — Therapy (Addendum)
[place] MAIN [facility] SERVICES [address] [place], [place], [address] Phone: [phone]   Fax:  [phone]  Occupational Therapy Treatment  Patient Details  Name: Kenneth Summers MRN: [id number] Date of Birth: August 01, 1945 No data recorded  Encounter Date: 01/06/2019  OT End of Session - 01/06/19 1532    Visit Number  38    Number of Visits  53   Date for OT Re-Evaluation  01/15/19    Authorization Type  Progress report period starting 12/26/2018    OT Start Time  1350    OT Stop Time  1430    OT Time Calculation (min)  40 min    Activity Tolerance  Patient tolerated treatment well;Patient limited by fatigue    Behavior During Therapy  [facility] for tasks assessed/performed       Past Medical History:  Diagnosis Date  . Alcohol abuse   . APS (antiphospholipid syndrome) ([place])   . BPH (benign prostatic hyperplasia)   . CAD (coronary artery disease)   . Cellulitis   . Cervical spinal stenosis   . Chronic osteomyelitis ([place])   . CKD (chronic kidney disease)   . CKD (chronic kidney disease)   . Clostridium difficile diarrhea   . Depression   . Diplopia   . Fatigue   . GERD (gastroesophageal reflux disease)   . Hyperlipemia   . Hypertension   . Hypovitaminosis D   . IBS (irritable bowel syndrome)   . 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 Insomnia   . Left lumbosacral radiculopathy   . Moderate obstructive sleep apnea   . Osteomyelitis of foot ([place])   . PAD (peripheral artery disease) ([place])   . Pruritus   . RA (rheumatoid arthritis) ([place])   . Radiculopathy   . Restless legs syndrome   . RLS (restless legs syndrome)   . SLE (systemic lupus erythematosus related syndrome) ([place])   . Stroke ([place])   . Toxic maculopathy of both eyes     Past Surgical History:  Procedure Laterality Date  . CARDIAC CATHETERIZATION    . CAROTID ENDARTERECTOMY    . CERVICAL LAMINECTOMY    . CORONARY ANGIOPLASTY    . FRACTURE SURGERY    . fusion C5-6-7    .  HERNIA REPAIR    . KNEE ARTHROSCOPY    . TONSILLECTOMY      There were no vitals filed for this visit.  Subjective Assessment - 01/06/19 1531    Subjective   Pt. reports that he had a nice Thanksgiving.    Patient is accompanied by:  Family member    Pertinent History  Pt. is a 73 y.o. 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 Pt. PMHx includes: Multiple Falls, Lupus, DJD, Renal Abscess, CVA. Pt. resides with his wife. Pt.'s wife and daughter assist with ADLs. Pt. has caregivers in  for 2 hours a day, 6 days a week. Pt. received Rehab services in acute care, at SNF for STR, and [facility] services. Pt. is retired from [employer] for [employer] and [employer].    Patient Stated Goals  To regain use of his left UE, and do more for himself.    Currently in Pain?  No/denies      OT TREATMENT    Neuro muscular re-education:  Pt. worked on grasping, and manipulating 1/2" washers from a magnetic dish using 2 point grasp pattern. Pt. worked on reaching up, stabilizing, and sustaining shoulder  elevation while placing the washer over a small precise target on vertical dowels positioned at various angles. Pt. Required verbal cues to engage his trunk when flexing, and reaching forward to place the washers onto to dowels.  Therapeutic Exercise:  Pt. performed resistive EZ Board exercises for forearm supination/pronation, wrist flexion/extension using gross grasp, and lateral pinch (key) grasp. Pt. performed resistive EZ Board exercises angled in several planes to promote shoulder flexion, abduction, and wrist flexion, and extension while performing resistive wrist flexion and extension with a gross grip.  Selfcare:  Pt. worked on UE dressing tasks using hemi-dressing techniques. Pt. requiredlesscues forinitiating position the sweatshirtwhenpreparing todonn a zip down sweatshirt. Pt. Required fewer cues to thread the LUE with the right, assist to bring the  jacket around his back, and reposition the jacket to prepare for zipping  Response to Treatment:  Pt. required multiple rest breaks today for his back after performing reaching tasks with his right and left UEs. Pt. required more cues today to engage his RUE. Pt. continues to work on improving LUE functional reaching, motor control, and [facility] skills. Pt. continues to work on improving UE functioning during ADLs, and IADL functioning.                         OT Education - 01/06/19 1531    Education Details  navigating in hall with wheelchair    Person(s) Educated  Patient    Methods  Explanation;Demonstration    Comprehension  Returned demonstration;Tactile cues required;Verbalized understanding;Need further instruction          OT Long Term Goals - 12/25/18 1603      OT LONG TERM GOAL #1   Title  Pt. will increase left shoulder flexion AROM by 10 degrees to access cabinet/shelf.    Baseline  Pt. is improving with LUE ROM, and functional reaching    Time  12    Period  Weeks    Status  On-going    Target Date  01/15/19      OT LONG TERM GOAL #2   Title  Pt. will donn a shirt with Supervision.    Baseline  MinA    Time  12    Period  Weeks    Status  On-going    Target Date  01/15/19      OT LONG TERM GOAL #3   Title  Pt. will require ModA to perform LE dressing    Baseline  MaxA left, ModA with the right    Time  12    Period  Weeks    Status  On-going    Target Date  01/15/19      OT LONG TERM GOAL #4   Title  Pt. will improve left grip strength by 10# to be able to open a jar/container.    Baseline  Pt. has difficulty opening containers    Time  12    Period  Weeks    Status  On-going    Target Date  01/15/19      OT LONG TERM GOAL #5   Title  Pt. will improve left hand [facility] skills to be able to assit with buttoning/zipping.    Baseline  Pt. has difficulty [facility] skills buttoning/zipping    Time  12    Period  Weeks    Status  On-going     Target Date  01/15/19      OT LONG TERM GOAL #6  Title  Pt. will demonstrate visual conmpensatory strategies during 100% of the time during ADLs    Baseline  Pt. requires cues at times for left sides awareness.    Time  12    Period  Weeks    Status  On-going    Target Date  01/15/19      OT LONG TERM GOAL #7   Title  Pt. will prepare a simple cold snack from w/c with supervision using cognitive compensatory strategies 100% of the time.    Baseline  Pt. is unable    Time  12    Period  Weeks    Status  On-going    Target Date  01/15/19      OT LONG TERM GOAL #8   Title  Pt. will accurately identify potential safety hazard using good safety awareness, and judgement 100% for ADLs, and IADLs.    Baseline  Limited    Time  12    Period  Weeks    Status  On-going    Target Date  01/15/20      OT LONG TERM GOAL  #9   TITLE  Pt. will  navigate his w/c around obstacles with Supervision and 100% accuracy    Baseline  Pt. requires cuing for obstacle on the left    Time  12    Period  Weeks    Status  On-going    Target Date  01/15/19            Plan - 01/06/19 1532    Clinical Impression Statement  Pt. required multiple rest breaks today for his back after performing reaching tasks with his right and left UEs. Pt. required more cues today to engage his RUE. Pt. continues to work on improving LUE functional reaching, motor control, and [facility] skills. Pt. continues to work on improving UE functioning during ADLs, and IADL functioning.    OT Occupational Profile and History  Problem Focused Assessment - Including review of records relating to presenting problem    Occupational performance deficits (Please refer to evaluation for details):  ADL's    Body Structure / Function / Physical Skills  UE functional use;Coordination;FMC;Dexterity;Strength;ROM    Cognitive Skills  Attention;Memory;Emotional;Problem Solve;Safety Awareness    Rehab Potential  Good    Clinical Decision Making   Several treatment options, min-mod task modification necessary    Comorbidities Affecting Occupational Performance:  Presence of comorbidities impacting occupational performance    Comorbidities impacting occupational performance description:  Phyical, cognitive, visual,  medical comorbidities    Modification or Assistance to Complete Evaluation   No modification of tasks or assist necessary to complete eval    OT Frequency  3x / week    OT Duration  12 weeks    OT Treatment/Interventions  Self-care/ADL training;DME and/or AE instruction;Therapeutic exercise;Therapeutic activities;Moist Heat;Cognitive remediation/compensation;Neuromuscular education;Visual/perceptual remediation/compensation;Coping strategies training;Patient/family education;Passive range of motion;Psychosocial skills training;Energy conservation;Functional Mobility Training    Recommended Other Services  PT    Consulted and Agree with Plan of Care  Patient    Family Member Consulted  Daughter [name]       Patient will benefit from skilled therapeutic intervention in order to improve the following deficits and impairments:   Body Structure / Function / Physical Skills: UE functional use, Coordination, FMC, Dexterity, Strength, ROM Cognitive Skills: Attention, Memory, Emotional, Problem Solve, Safety Awareness     Visit Diagnosis: Muscle weakness (generalized)  Other lack of coordination    Problem List Patient Active Problem  List   Diagnosis Date Noted  . Sepsis ([place]) 10/18/2018    Harrel Carina, MS, OTR/L 01/06/2019, 3:40 PM  [place] MAIN [facility] SERVICES [address] [place], [place], [address] Phone: [phone]   Fax:  [phone]  Name: Kenneth Summers MRN: [id number] Date of Birth: 1945-07-08

## 2019-01-08 ENCOUNTER — Encounter: Payer: Self-pay | Admitting: Physical Therapy

## 2019-01-08 ENCOUNTER — Other Ambulatory Visit: Payer: Self-pay

## 2019-01-08 ENCOUNTER — Encounter: Payer: Self-pay | Admitting: Occupational Therapy

## 2019-01-08 ENCOUNTER — Ambulatory Visit: Payer: Medicare Other | Admitting: Occupational Therapy

## 2019-01-08 ENCOUNTER — Ambulatory Visit: Payer: Medicare Other | Attending: Family Medicine | Admitting: Physical Therapy

## 2019-01-08 DIAGNOSIS — M6281 Muscle weakness (generalized): Secondary | ICD-10-CM

## 2019-01-08 DIAGNOSIS — R278 Other lack of coordination: Secondary | ICD-10-CM | POA: Insufficient documentation

## 2019-01-08 DIAGNOSIS — R633 Feeding difficulties: Secondary | ICD-10-CM | POA: Insufficient documentation

## 2019-01-08 DIAGNOSIS — H547 Unspecified visual loss: Secondary | ICD-10-CM | POA: Diagnosis present

## 2019-01-08 DIAGNOSIS — R296 Repeated falls: Secondary | ICD-10-CM | POA: Insufficient documentation

## 2019-01-08 DIAGNOSIS — I69354 Hemiplegia and hemiparesis following cerebral infarction affecting left non-dominant side: Secondary | ICD-10-CM | POA: Diagnosis present

## 2019-01-08 DIAGNOSIS — R41841 Cognitive communication deficit: Secondary | ICD-10-CM | POA: Insufficient documentation

## 2019-01-08 DIAGNOSIS — R262 Difficulty in walking, not elsewhere classified: Secondary | ICD-10-CM | POA: Insufficient documentation

## 2019-01-08 NOTE — Therapy (Signed)
[place] MAIN [facility] SERVICES [address] [place], [place], [address] Phone: [phone]   Fax:  [phone]  Physical Therapy Treatment  Patient Details  Name: Kenneth Summers MRN: [id number] Date of Birth: 09-Jan-1946 Referring Provider (PT): Dr. [name], Guy Begin   Encounter Date: 01/08/2019  PT End of Session - 01/08/19 1257    Visit Number  80    Number of Visits  108    Date for PT Re-Evaluation  02/27/19    Authorization Type  Medicare reporting period starting 09/11/18    Authorization Time Period  goals last assessed 12/04/18    PT Start Time  1302    PT Stop Time  1345    PT Time Calculation (min)  43 min    Equipment Utilized During Treatment  Gait belt    Activity Tolerance  Patient tolerated treatment well;Patient limited by fatigue    Behavior During Therapy  [facility] for tasks assessed/performed       Past Medical History:  Diagnosis Date  . Alcohol abuse   . APS (antiphospholipid syndrome) ([place])   . BPH (benign prostatic hyperplasia)   . CAD (coronary artery disease)   . Cellulitis   . Cervical spinal stenosis   . Chronic osteomyelitis ([place])   . CKD (chronic kidney disease)   . CKD (chronic kidney disease)   . Clostridium difficile diarrhea   . Depression   . Diplopia   . Fatigue   . GERD (gastroesophageal reflux disease)   . Hyperlipemia   . Hypertension   . Hypovitaminosis D   . IBS (irritable bowel syndrome)   . IgA deficiency ([place])   . Insomnia   . Left lumbosacral radiculopathy   . Moderate obstructive sleep apnea   . Osteomyelitis of foot ([place])   . PAD (peripheral artery disease) ([place])   . Pruritus   . RA (rheumatoid arthritis) ([place])   . Radiculopathy   . Restless legs syndrome   . RLS (restless legs syndrome)   . SLE (systemic lupus erythematosus related syndrome) ([place])   . Stroke ([place])   . Toxic maculopathy of both eyes     Past Surgical History:  Procedure Laterality Date  . CARDIAC  CATHETERIZATION    . CAROTID ENDARTERECTOMY    . CERVICAL LAMINECTOMY    . CORONARY ANGIOPLASTY    . FRACTURE SURGERY    . fusion C5-6-7    . HERNIA REPAIR    . KNEE ARTHROSCOPY    . TONSILLECTOMY      There were no vitals filed for this visit.  Subjective Assessment - 01/08/19 1346    Subjective  Patient reports doing well; Denies any new falls; Denies any pain or soreness. reports working on exercises this morning with having a hard time with partial transfers;    Patient is accompained by:  Family member    Pertinent History  Patient is a 73 y.o. male who presents to outpatient physical therapy with a referral for medical diagnosis of CVA. This patient's chief complaints consist of left hemiplegia and overall deconditioning leading to the following functional deficits: dependent for ADLs, IADLs, unable to transfer to car, dependent transfers at home with hoyer lift, unable to walk or stand without significant assistance, difficulty with W/C navigation..    Limitations  Sitting;Lifting;Standing;House hold activities;Writing;Walking;Other (comment)    Diagnostic tests  Brain MRI 11/01/2017: IMPRESSION: 1. Acute right ACA territory infarct as demonstrated on prior CT imaging. No intracranial hemorrhage or significant mass  effect. 2. 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    Currently in Pain?  No/denies    Pain Onset  Yesterday    Multiple Pain Sites  No        TREATMENT: Patient presents to therapy with power chair.  Adjusted height of wheelchair to 26 inch in height to facilitate better transfer.  Patient instructed in sit<>Stand transfer using RW He required mod VCs for forward weight shift, scooting forward to get foot on floor and cues for hand placement;  Patient able to transfer sit<>Stand x5 reps with RW with min A +2 (therapist assisted to block LLE knee and daughter held RW for better transfer) Upon standing, patient able to pull self  up to standing and upon standing exhibits mild posterior lean requiring cues for forward weight shift. He was able to stand for 10-30 sec each repetition;  With increased repetition he was able to exhibit better hip extension upon standing but then exhibit increased fatigue requiring increased cues for forward weight shift and to increase push through UE/LE;  Patient fatigues quickly with subsequent repetitions requiring short seated rest break;  Patient instructed in stand pivot transfer wheelchair<>mat table with therapist assist, min A +1 x1 rep, and then transferred back mat table to wheelchair with min A +2 for pivot. Patient had increased difficulty with pivot due to weakness and imbalance.   Seated edge of mat table: Pelvic rocks anterior/posterior x10 reps with supervision with good lumbopelvic mobility; Patient able to sit edge of mat table with good trunk control and no loss of balance;   Response to treatment: Patient tolerated well. He does fatigue quickly requiring short seated rest breaks. Patient required cues for forward weight shift and to increase push through UE/LE for better transfer ability. He is progressing well with sit<>stand transfers being able to pull self up to standing with min A for left knee blocking but not requiring assistance to bring trunk forward;                        PT Education - 01/08/19 1347    Education Details  LE strengthening, transfers, positioning/HEP    Person(s) Educated  Patient;Child(ren)    Methods  Explanation;Demonstration;Verbal cues    Comprehension  Verbalized understanding;Returned demonstration;Verbal cues required;Need further instruction       PT Short Term Goals - 12/04/18 1303      PT SHORT TERM GOAL #1   Title  Be independent with initial home exercise program for self-management of symptoms.    Baseline  HEP to be given at second session; advice to practice unsupported sitting at home (06/19/2018);  07/08/18: Performing at home, 6/29: needs assistance but is doing exercise program regularly; 08/22/18: adherent;    Time  2    Period  Weeks    Status  Achieved    Target Date  06/26/18        PT Long Term Goals - 12/04/18 1303      PT LONG TERM GOAL #1   Title  Patient will complete all bed mobility with min A to improve functional independence for getting in and out of bed and adjusting in bed.     Baseline  max A - total A reported by family (06/19/2018); 07/08/18: still requires maxA, dtr reports improvement in rolling since starting therapy; 6/23: min A to supervision in clinic; not doing at home right; 8/5: Pt's daughter states that his rolling  has improved, but still has difficulty with all other bed mobility; 10/23/18: pts daughter states that she is doing about 35% of the work if he is in proper position, he does uses the bed rails to help roll;  using Harrel Lemon for coming to sit EOB; 10/12: pts daughter states that she is doing about 30% of the work if he is in proper position, he does uses the bed rails to help roll, but it is improving;  using Hoyer for coming to sit EOB, 10/29: min A for sit to sidelying, rolling supervision, able to transition sidelying to sit with HHA;    Time  12    Period  Weeks    Status  Achieved      PT LONG TERM GOAL #2   Title  Patient will complete sit <> stand transfer chair to chair with Min A and LRAD to improve functional independence for household and community mobility.     Baseline  max A +2 STS at W/C (06/12/2018); max A +2-3 STS or stand pivot transfer, able to complete more reps (06/19/2018); 07/08/18: not safe to attempt at this time; 09/11/18: requires maxA+1 or modA+2 for STSs and squat pivot transfer; 10/23/18: requires maxA+1 or modA+2 for STSs and squat pivot transfer; 10/12: requires maxA+1 or modA+2 for STSs and squat pivot transfer; 10/29: patient able to complete sit<>Stand transfer min A +1 with parallel bars and RW, although inconsistent, mod A for  stand pivot;    Time  12    Period  Weeks    Status  Partially Met    Target Date  02/27/19      PT LONG TERM GOAL #3   Title  Patient will navigate power w/c with min A x 100 feet to improve mobility for household and short community distances.    Baseline  required total A (06/12/2018); able to roll 20 feet with min A (06/20/2018); 07/08/18: Pt can go approximately 10' without assist per family;  10/23/2018: pt's daughter states that his power WC mobility has become 100% better with the new hand control, slowed speed, and with practice; 10/12: pt's daughter reports that he can perform household WC mobility and limited community ambulation with minA    Time  12    Period  Weeks    Status  Achieved      PT LONG TERM GOAL #4   Title  Patient will complete car transfers W/C to family SUV with min A using LRAD to improve his abilty to participate in community activities.     Baseline  unable to complete car transfers at all (06/12/2018); unable to complete car transfers at all (06/19/2018).; 07/08/18: Unable to perform at this time; 09/11/18: unable to perform at this time; 10/23/2018: unable to complete car transfers at all; 10/12:  unable to complete car transfers at all, 10/29: unable    Time  12    Period  Weeks    Status  On-going    Target Date  02/27/19      PT LONG TERM GOAL #5   Title  Patient will ambulate at least 150 feet with mod I using LRAD to improve mobility for household and community distances.     Baseline  unable to take steps (06/12/2018); able to shift R leg forward and back with max A and RW at edge of plinth (06/19/2018); 07/08/18: unable to ambulate at this time. 09/11/18: unable to perform at this time; 10/23/2018: unable to perform at this time; 10/12:  unable to perform at this time, 10/29: unable at this time    Time  12    Period  Weeks    Status  On-going    Target Date  02/27/19      Additional Long Term Goals   Additional Long Term Goals  Yes      PT LONG TERM GOAL #6   Title   Patient will be able to safety navigate up/down ramp with power chair, exhibiting good safety awareness, mod I to safely enter/exit his home.    Baseline  unable to complet stairs (06/12/2018); unable to complete stairs (06/19/2018); 07/08/18: unable to attempt at this time; 09/11/18: unsafe to attempt at this time; 10/23/2018: unable to complet stairs (06/12/2018); unable to complete stairs (06/19/2018); 07/08/18: unable to attempt at this time; 09/11/18: unsafe to attempt at this time; 10/23/18: unsafe to attempt at this time, 10/29: requires superivison and occasional min A for safety    Time  12    Period  Weeks    Status  Revised    Target Date  02/27/19      PT LONG TERM GOAL #7   Title  Patient will increase functional reach test to >15 inches in sitting to exhibit improved sitting balance and positioning and reduce fall risk;    Baseline  07/30/18: 12 inches; 09/11/18: 10-12 inches; 10/23/18: 12-13 inch; 10/12: 14.5", 10/29: 15.5 inch    Time  4    Period  Weeks    Status  Achieved      PT LONG TERM GOAL #8   Title  Patient will increase LLE quad strength to 3+/5 to improve functional strength for standing and mobility;    Baseline  10/28: 2+/5    Time  12    Period  Weeks    Status  New    Target Date  02/27/19            Plan - 01/08/19 1348    Clinical Impression Statement  Patient motivated and participated well within session. Instructed patient in sit<>stand transfer from wheelchair using RW. Patient requires assistance to block LLE knee but was able to stand with RW with good positioning and control. He does fatigue with multiple repetitions requiring increased cues for forward weight shift and to increase push through LE. Patient instructed in stand pivot transfer. He was able to complete a full stand but required increased assistance for pivot due to weakness and imbalance. 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    Examination-Activity Limitations  Bed Mobility;Bend;Sit;Toileting;Stand;Stairs;Lift;Transfers;Squat;Locomotion Level;Carry;Dressing;Hygiene/Grooming;Continence    Examination-Participation Restrictions  Yard Work;Interpersonal Relationship;Community Activity    Stability/Clinical Decision Making  Evolving/Moderate complexity    Rehab Potential  Fair    PT Frequency  3x / week    PT Duration  12 weeks    PT Treatment/Interventions  ADLs/Self Care Home Management;Electrical Stimulation;Moist Heat;Cryotherapy;Gait training;Stair training;Functional mobility training;Therapeutic exercise;Balance training;Neuromuscular re-education;Cognitive remediation;Patient/family education;Orthotic Fit/Training;Wheelchair mobility training;Manual techniques;Passive range of motion;Energy conservation;Joint Manipulations  PT Next Visit Plan  Update goals, progress note; Work on strength, sitting/standing balance, functional activities such as rolling, bed mobility, transfers; caregiver training    PT Home Exercise Plan  handout provided via Beth during previous sessions    Consulted and Agree with Plan of Care  Patient;Family member/caregiver    Family Member Consulted  daughter [name]       Patient will benefit from skilled therapeutic intervention in order to improve the following deficits and impairments:  Abnormal gait, Decreased  activity tolerance, Decreased cognition, Decreased endurance, Decreased knowledge of use of DME, Decreased range of motion, Decreased skin integrity, Decreased strength, Impaired perceived functional ability, Impaired sensation, Impaired UE functional use, Improper body mechanics, Pain, Cardiopulmonary status limiting activity, Decreased balance, Decreased coordination, Decreased mobility, Difficulty walking, Impaired tone, Postural dysfunction  Visit Diagnosis: Muscle weakness (generalized)  Other lack of coordination  Difficulty in walking, not elsewhere classified     Problem List Patient Active Problem List   Diagnosis Date Noted  . Sepsis ([place]) 10/18/2018    Arlett Goold PT, DPT 01/08/2019, 1:52 PM  [place] MAIN [facility] SERVICES [address] [place], [place], [address] Phone: [phone]   Fax:  [phone]  Name: Kenneth Summers MRN: [id number] Date of Birth: November 12, 1945

## 2019-01-08 NOTE — Therapy (Signed)
[place] MAIN [facility] SERVICES [address] [place], [place], [address] Phone: [phone]   Fax:  [phone]  Occupational Therapy Treatment  Patient Details  Name: Kenneth Summers MRN: [id number] Date of Birth: [birth year] No data recorded  Encounter Date: 01/08/2019  OT End of Session - 01/08/19 1748    Visit Number  74    Number of Visits  85    Date for OT Re-Evaluation  01/15/19    Authorization Type  Progress report period starting 12/26/2018    OT Start Time  1345    OT Stop Time  1430    OT Time Calculation (min)  45 min    Activity Tolerance  Patient tolerated treatment well;Patient limited by fatigue    Behavior During Therapy  [facility] for tasks assessed/performed       Past Medical History:  Diagnosis Date  . Alcohol abuse   . APS (antiphospholipid syndrome) ([place])   . BPH (benign prostatic hyperplasia)   . CAD (coronary artery disease)   . Cellulitis   . Cervical spinal stenosis   . Chronic osteomyelitis ([place])   . CKD (chronic kidney disease)   . CKD (chronic kidney disease)   . Clostridium difficile diarrhea   . Depression   . Diplopia   . Fatigue   . GERD (gastroesophageal reflux disease)   . Hyperlipemia   . Hypertension   . Hypovitaminosis D   . IBS (irritable bowel syndrome)   . 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 Insomnia   . Left lumbosacral radiculopathy   . Moderate obstructive sleep apnea   . Osteomyelitis of foot ([place])   . PAD (peripheral artery disease) ([place])   . Pruritus   . RA (rheumatoid arthritis) ([place])   . Radiculopathy   . Restless legs syndrome   . RLS (restless legs syndrome)   . SLE (systemic lupus erythematosus related syndrome) ([place])   . Stroke ([place])   . Toxic maculopathy of both eyes     Past Surgical History:  Procedure Laterality Date  . CARDIAC CATHETERIZATION    . CAROTID ENDARTERECTOMY    . CERVICAL LAMINECTOMY    . CORONARY ANGIOPLASTY    . FRACTURE SURGERY    . fusion C5-6-7    .  HERNIA REPAIR    . KNEE ARTHROSCOPY    . TONSILLECTOMY      There were no vitals filed for this visit.  Subjective Assessment - 01/08/19 1746    Subjective   Pt. reports that he had a nice Thanksgiving.    Patient is accompanied by:  Family member    Pertinent History  Pt. is a 73 y.o. 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 Pt. PMHx includes: Multiple Falls, Lupus, DJD, Renal Abscess, CVA. Pt. resides with his wife. Pt.'s wife and daughter assist with ADLs. Pt. has caregivers in  for 2 hours a day, 6 days a week. Pt. received Rehab services in acute care, at SNF for STR, and Central services. Pt. is retired from [employer] for [employer] and [employer].    Currently in Pain?  No/denies      OT TREATMENT    Therapeutic Activities:  Pt. worked on using his left hand for grasping, flipping and stacking 2" large pegs on the Instructo board placed at a tabletop surface.  Pt. Was able to complete simple design patterns with increased time. Pt. required cues, and increased time for more complex  design patterns. Pt. Required frequent cues to engage his left hand.  Selfcare:  Pt. worked on UE dressing tasks using hemi-dressing techniques. Pt. requiredlesscues forinitiatingposition the sweatshirtwhenpreparing todonn a zip down sweatshirt. Pt. Requiredfewercues to thread the LUE with the right, assist to bring the jacket around his back, and reposition the jacket to prepare for zipping.  Response to Treatment:  Pt. required restbreaks with reclining positioning breaks during the session today. Pt. required fewer cues to donn his gray sweatshirt. Pt.required cues to use his LUE during reaching, and grasping tasks as pt. intermittently attempts to use his RUE instead of his left. Pt. required fewer cues today for initiatiation, and complex problem-solving  tasks.                        OT Education - 01/08/19 1748    Education  Details  navigating in hall with wheelchair    Person(s) Educated  Patient    Methods  Explanation;Demonstration    Comprehension  Returned demonstration;Tactile cues required;Verbalized understanding;Need further instruction          OT Long Term Goals - 12/25/18 1603      OT LONG TERM GOAL #1   Title  Pt. will increase left shoulder flexion AROM by 10 degrees to access cabinet/shelf.    Baseline  Pt. is improving with LUE ROM, and functional reaching    Time  12    Period  Weeks    Status  On-going    Target Date  01/15/19      OT LONG TERM GOAL #2   Title  Pt. will donn a shirt with Supervision.    Baseline  MinA    Time  12    Period  Weeks    Status  On-going    Target Date  01/15/19      OT LONG TERM GOAL #3   Title  Pt. will require ModA to perform LE dressing    Baseline  MaxA left, ModA with the right    Time  12    Period  Weeks    Status  On-going    Target Date  01/15/19      OT LONG TERM GOAL #4   Title  Pt. will improve left grip strength by 10# to be able to open a jar/container.    Baseline  Pt. has difficulty opening containers    Time  12    Period  Weeks    Status  On-going    Target Date  01/15/19      OT LONG TERM GOAL #5   Title  Pt. will improve left hand [facility] skills to be able to assit with buttoning/zipping.    Baseline  Pt. has difficulty [facility] skills buttoning/zipping    Time  12    Period  Weeks    Status  On-going    Target Date  01/15/19      OT LONG TERM GOAL #6   Title  Pt. will demonstrate visual conmpensatory strategies during 100% of the time during ADLs    Baseline  Pt. requires cues at times for left sides awareness.    Time  12    Period  Weeks    Status  On-going    Target Date  01/15/19      OT LONG TERM GOAL #7   Title  Pt. will prepare a simple cold snack from w/c with supervision using cognitive compensatory strategies 100% of  the time.    Baseline  Pt. is unable    Time  12    Period  Weeks    Status  On-going     Target Date  01/15/19      OT LONG TERM GOAL #8   Title  Pt. will accurately identify potential safety hazard using good safety awareness, and judgement 100% for ADLs, and IADLs.    Baseline  Limited    Time  12    Period  Weeks    Status  On-going    Target Date  01/15/20      OT LONG TERM GOAL  #9   TITLE  Pt. will  navigate his w/c around obstacles with Supervision and 100% accuracy    Baseline  Pt. requires cuing for obstacle on the left    Time  12    Period  Weeks    Status  On-going    Target Date  01/15/19            Plan - 01/08/19 1752    Clinical Impression Statement Pt. required restbreaks with reclining positioning breaks during the session today. Pt. required fewer cues to donn his gray sweatshirt. Pt.required cues to use his LUE during reaching, and grasping tasks as pt. intermittently attempts to use his RUE instead of his left. Pt. required fewer cues today for initiatiation, and complex problem-solving  tasks.    OT Occupational Profile and History  Problem Focused Assessment - Including review of records relating to presenting problem    Occupational performance deficits (Please refer to evaluation for details):  ADL's    Body Structure / Function / Physical Skills  UE functional use;Coordination;FMC;Dexterity;Strength;ROM    Cognitive Skills  Attention;Memory;Emotional;Problem Solve;Safety Awareness    Rehab Potential  Good    Clinical Decision Making  Several treatment options, min-mod task modification necessary    Comorbidities Affecting Occupational Performance:  Presence of comorbidities impacting occupational performance    Comorbidities impacting occupational performance description:  Phyical, cognitive, visual,  medical comorbidities    Modification or Assistance to Complete Evaluation   No modification of tasks or assist necessary to complete eval    OT Frequency  3x / week    OT Duration  12 weeks    OT Treatment/Interventions  Self-care/ADL  training;DME and/or AE instruction;Therapeutic exercise;Therapeutic activities;Moist Heat;Cognitive remediation/compensation;Neuromuscular education;Visual/perceptual remediation/compensation;Coping strategies training;Patient/family education;Passive range of motion;Psychosocial skills training;Energy conservation;Functional Mobility Training    Consulted and Agree with Plan of Care  Patient    Family Member Consulted  Daughter [name]       Patient will benefit from skilled therapeutic intervention in order to improve the following deficits and impairments:   Body Structure / Function / Physical Skills: UE functional use, Coordination, FMC, Dexterity, Strength, ROM Cognitive Skills: Attention, Memory, Emotional, Problem Solve, Safety Awareness     Visit Diagnosis: Muscle weakness (generalized)  Other lack of coordination    Problem List Patient Active Problem List   Diagnosis Date Noted  . Sepsis ([place]) 10/18/2018    Harrel Carina, MS, OTR/L 01/08/2019, 6:04 PM  [place] MAIN [facility] SERVICES [address] [place], [place], [address] Phone: [phone]   Fax:  [phone]  Name: ADYNN TENSLEY MRN: [id number] Date of Birth: 06-26-1945

## 2019-01-09 ENCOUNTER — Ambulatory Visit: Payer: Medicare Other | Admitting: Occupational Therapy

## 2019-01-09 ENCOUNTER — Ambulatory Visit: Payer: Medicare Other | Admitting: Physical Therapy

## 2019-01-09 ENCOUNTER — Other Ambulatory Visit: Payer: Self-pay

## 2019-01-09 ENCOUNTER — Ambulatory Visit: Payer: Medicare Other

## 2019-01-09 ENCOUNTER — Encounter: Payer: Medicare Other | Admitting: Occupational Therapy

## 2019-01-09 ENCOUNTER — Encounter: Payer: Self-pay | Admitting: Occupational Therapy

## 2019-01-09 ENCOUNTER — Encounter: Payer: Self-pay | Admitting: Physical Therapy

## 2019-01-09 DIAGNOSIS — M6281 Muscle weakness (generalized): Secondary | ICD-10-CM

## 2019-01-09 DIAGNOSIS — R278 Other lack of coordination: Secondary | ICD-10-CM

## 2019-01-09 DIAGNOSIS — R262 Difficulty in walking, not elsewhere classified: Secondary | ICD-10-CM

## 2019-01-09 NOTE — Therapy (Signed)
[place] MAIN [facility] SERVICES [address] [place], [place], [address] Phone: [phone]   Fax:  [phone]  Occupational Therapy Treatment  Patient Details  Name: Kenneth Summers MRN: [id number] Date of Birth: [birth year] No data recorded  Encounter Date: 01/09/2019  OT End of Session - 01/09/19 1540    Visit Number  5    Number of Visits  41    Date for OT Re-Evaluation  01/15/19    Authorization Type  Progress report period starting 12/26/2018    OT Start Time  1345    OT Stop Time  1430    OT Time Calculation (min)  45 min    Activity Tolerance  Patient tolerated treatment well;Patient limited by fatigue    Behavior During Therapy  [facility] for tasks assessed/performed       Past Medical History:  Diagnosis Date  . Alcohol abuse   . APS (antiphospholipid syndrome) ([place])   . BPH (benign prostatic hyperplasia)   . CAD (coronary artery disease)   . Cellulitis   . Cervical spinal stenosis   . Chronic osteomyelitis ([place])   . CKD (chronic kidney disease)   . CKD (chronic kidney disease)   . Clostridium difficile diarrhea   . Depression   . Diplopia   . Fatigue   . GERD (gastroesophageal reflux disease)   . Hyperlipemia   . Hypertension   . Hypovitaminosis D   . IBS (irritable bowel syndrome)   . 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 Insomnia   . Left lumbosacral radiculopathy   . Moderate obstructive sleep apnea   . Osteomyelitis of foot ([place])   . PAD (peripheral artery disease) ([place])   . Pruritus   . RA (rheumatoid arthritis) ([place])   . Radiculopathy   . Restless legs syndrome   . RLS (restless legs syndrome)   . SLE (systemic lupus erythematosus related syndrome) ([place])   . Stroke ([place])   . Toxic maculopathy of both eyes     Past Surgical History:  Procedure Laterality Date  . CARDIAC CATHETERIZATION    . CAROTID ENDARTERECTOMY    . CERVICAL LAMINECTOMY    . CORONARY ANGIOPLASTY    . FRACTURE SURGERY    . fusion C5-6-7    .  HERNIA REPAIR    . KNEE ARTHROSCOPY    . TONSILLECTOMY      There were no vitals filed for this visit.  Subjective Assessment - 01/09/19 1539    Subjective   Pt. came to therapy alone today.    Patient is accompanied by:  Family member    Pertinent History  Pt. is a 73 y.o. 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 Pt. PMHx includes: Multiple Falls, Lupus, DJD, Renal Abscess, CVA. Pt. resides with his wife. Pt.'s wife and daughter assist with ADLs. Pt. has caregivers in  for 2 hours a day, 6 days a week. Pt. received Rehab services in acute care, at SNF for STR, and [place] services. Pt. is retired from [employer] for [employer] and [employer].    Currently in Pain?  No/denies      OT TREATMENT    Therapeutic Activities:  Pt. worked on using his left hand for grasping, flipping and stacking 2" large pegs on the Instructo board placed at a tabletop surface. Pt. Required verbal cues, visual cues, increased time, and cues to use his left hand during complex design patterns. Pt. Required frequent cues  to engage his left hand.  Selfcare:  Pt. worked on UE dressing tasks using hemi-dressing techniques. Pt. requiredlesscues forinitiatingposition the sweatshirtwhenpreparing todonn a zip down sweatshirt. Pt. Requiredfewercues to thread the LUE with the right, assist to bring the jacket around his back, and reposition the jacket to prepare for zipping.  Response to Treatment:  Pt. continues to make steady progress. Pt. continues to require several reclining restbreaks during the session today. Pt. continues to require cues, and minA to donn his brown sweatshirt. Pt. continues to require verbal, and visual cues to follow more complex design patterns. As well as cues to engage his left hand throughout the task. Pt. continues to work on improving UE functioning in order to improve UE functioning, and promote independence with ADLs, and IADL  tasks.                       OT Education - 01/09/19 1540    Education Details  UE functioning    Person(s) Educated  Patient    Methods  Explanation;Demonstration    Comprehension  Returned demonstration;Tactile cues required;Verbalized understanding;Need further instruction          OT Long Term Goals - 12/25/18 1603      OT LONG TERM GOAL #1   Title  Pt. will increase left shoulder flexion AROM by 10 degrees to access cabinet/shelf.    Baseline  Pt. is improving with LUE ROM, and functional reaching    Time  12    Period  Weeks    Status  On-going    Target Date  01/15/19      OT LONG TERM GOAL #2   Title  Pt. will donn a shirt with Supervision.    Baseline  MinA    Time  12    Period  Weeks    Status  On-going    Target Date  01/15/19      OT LONG TERM GOAL #3   Title  Pt. will require ModA to perform LE dressing    Baseline  MaxA left, ModA with the right    Time  12    Period  Weeks    Status  On-going    Target Date  01/15/19      OT LONG TERM GOAL #4   Title  Pt. will improve left grip strength by 10# to be able to open a jar/container.    Baseline  Pt. has difficulty opening containers    Time  12    Period  Weeks    Status  On-going    Target Date  01/15/19      OT LONG TERM GOAL #5   Title  Pt. will improve left hand [facility] skills to be able to assit with buttoning/zipping.    Baseline  Pt. has difficulty [facility] skills buttoning/zipping    Time  12    Period  Weeks    Status  On-going    Target Date  01/15/19      OT LONG TERM GOAL #6   Title  Pt. will demonstrate visual conmpensatory strategies during 100% of the time during ADLs    Baseline  Pt. requires cues at times for left sides awareness.    Time  12    Period  Weeks    Status  On-going    Target Date  01/15/19      OT LONG TERM GOAL #7   Title  Pt. will prepare a simple  cold snack from w/c with supervision using cognitive compensatory strategies 100% of the time.     Baseline  Pt. is unable    Time  12    Period  Weeks    Status  On-going    Target Date  01/15/19      OT LONG TERM GOAL #8   Title  Pt. will accurately identify potential safety hazard using good safety awareness, and judgement 100% for ADLs, and IADLs.    Baseline  Limited    Time  12    Period  Weeks    Status  On-going    Target Date  01/15/20      OT LONG TERM GOAL  #9   TITLE  Pt. will  navigate his w/c around obstacles with Supervision and 100% accuracy    Baseline  Pt. requires cuing for obstacle on the left    Time  12    Period  Weeks    Status  On-going    Target Date  01/15/19            Plan - 01/09/19 1541    Clinical Impression Statement  Pt. continues to make steady progress. Pt. continues to require several reclining restbreaks during the session today. Pt. continues to require cues, and minA to donn his brown sweatshirt. Pt. continues to require verbal, and visual cues to follow more complex design patterns. As well as cues to engage his left hand throughout the task. Pt. continues to work on improving UE functioning in order to improve UE functioning, and promote independence with ADLs, and IADL tasks.    OT Occupational Profile and History  Problem Focused Assessment - Including review of records relating to presenting problem    Occupational performance deficits (Please refer to evaluation for details):  ADL's    Body Structure / Function / Physical Skills  UE functional use;Coordination;FMC;Dexterity;Strength;ROM    Cognitive Skills  Attention;Memory;Emotional;Problem Solve;Safety Awareness    Rehab Potential  Good    Clinical Decision Making  Several treatment options, min-mod task modification necessary    Comorbidities Affecting Occupational Performance:  Presence of comorbidities impacting occupational performance    Comorbidities impacting occupational performance description:  Phyical, cognitive, visual,  medical comorbidities    Modification or  Assistance to Complete Evaluation   No modification of tasks or assist necessary to complete eval    OT Frequency  3x / week    OT Duration  12 weeks    OT Treatment/Interventions  Self-care/ADL training;DME and/or AE instruction;Therapeutic exercise;Therapeutic activities;Moist Heat;Cognitive remediation/compensation;Neuromuscular education;Visual/perceptual remediation/compensation;Coping strategies training;Patient/family education;Passive range of motion;Psychosocial skills training;Energy conservation;Functional Mobility Training    Consulted and Agree with Plan of Care  Patient       Patient will benefit from skilled therapeutic intervention in order to improve the following deficits and impairments:   Body Structure / Function / Physical Skills: UE functional use, Coordination, FMC, Dexterity, Strength, ROM Cognitive Skills: Attention, Memory, Emotional, Problem Solve, Safety Awareness     Visit Diagnosis: Muscle weakness (generalized)    Problem List Patient Active Problem List   Diagnosis Date Noted  . Sepsis ([place]) 10/18/2018    Harrel Carina, MS, OTR/L 01/09/2019, 3:52 PM  [place] MAIN [facility] SERVICES [address] [place], [place], [address] Phone: [phone]   Fax:  [phone]  Name: JAMAURION FEENEY MRN: [id number] Date of Birth: 17-Oct-1945

## 2019-01-09 NOTE — Therapy (Signed)
[facility] [address] [place], [place], [address] Phone: [phone]   Fax:  [phone]  Physical Therapy Treatment  Patient Details  Name: Kenneth Summers MRN: [id number] Date of Birth: May 25, 1945 Referring Provider (PT): Dr. [name], Guy Begin   Encounter Date: 01/09/2019  PT End of Session - 01/09/19 1312    Visit Number  40    Number of Visits  108    Date for PT Re-Evaluation  02/27/19    Authorization Type  Medicare reporting period starting 09/11/18    Authorization Time Period  goals last assessed 12/04/18    PT Start Time  1301    PT Stop Time  1345    PT Time Calculation (min)  44 min    Equipment Utilized During Treatment  Gait belt    Activity Tolerance  Patient tolerated treatment well;Patient limited by fatigue    Behavior During Therapy  [place] for tasks assessed/performed       Past Medical History:  Diagnosis Date  . Alcohol abuse   . APS (antiphospholipid syndrome) ([place])   . BPH (benign prostatic hyperplasia)   . CAD (coronary artery disease)   . Cellulitis   . Cervical spinal stenosis   . Chronic osteomyelitis ([place])   . CKD (chronic kidney disease)   . CKD (chronic kidney disease)   . Clostridium difficile diarrhea   . Depression   . Diplopia   . Fatigue   . GERD (gastroesophageal reflux disease)   . Hyperlipemia   . Hypertension   . Hypovitaminosis D   . IBS (irritable bowel syndrome)   . 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 Insomnia   . Left lumbosacral radiculopathy   . Moderate obstructive sleep apnea   . Osteomyelitis of foot ([place])   . PAD (peripheral artery disease) ([place])   . Pruritus   . RA (rheumatoid arthritis) ([place])   . Radiculopathy   . Restless legs syndrome   . RLS (restless legs syndrome)   . SLE (systemic lupus erythematosus related syndrome) ([place])   . Stroke ([place])   . Toxic maculopathy of both eyes     Past Surgical History:  Procedure Laterality Date  . CARDIAC  CATHETERIZATION    . CAROTID ENDARTERECTOMY    . CERVICAL LAMINECTOMY    . CORONARY ANGIOPLASTY    . FRACTURE SURGERY    . fusion C5-6-7    . HERNIA REPAIR    . KNEE ARTHROSCOPY    . TONSILLECTOMY      There were no vitals filed for this visit.  Subjective Assessment - 01/09/19 1310    Subjective  Patient reports increased neck pain on left side. Reports some soreness in left knee after getting off the [place] because he hit his knee on the ramp. Otherwise he is doing well. Denies any new falls;    Patient is accompained by:  Family member    Pertinent History  Patient is a 73 y.o. male who presents to outpatient physical therapy with a referral for medical diagnosis of CVA. This patient's chief complaints consist of left hemiplegia and overall deconditioning leading to the following functional deficits: dependent for ADLs, IADLs, unable to transfer to car, dependent transfers at home with hoyer lift, unable to walk or stand without significant assistance, difficulty with W/C navigation..    Limitations  Sitting;Lifting;Standing;House hold activities;Writing;Walking;Other (comment)    Diagnostic tests  Brain MRI 11/01/2017: IMPRESSION: 1. Acute right ACA territory infarct as demonstrated on  prior CT imaging. No intracranial hemorrhage or significant mass effect. 2. 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    Currently in Pain?  Yes    Pain Score  7     Pain Location  Neck    Pain Orientation  Left    Pain Descriptors / Indicators  Aching;Sore    Pain Type  Acute pain    Pain Onset  Yesterday    Pain Frequency  Intermittent    Aggravating Factors   tender    Pain Relieving Factors  stretch/activity    Effect of Pain on Daily Activities  no change;    Multiple Pain Sites  Yes    Pain Score  3    Pain Location  Knee    Pain Orientation  Left    Pain Descriptors / Indicators  Aching;Sore    Pain Type  Acute pain    Pain Onset  Today    Pain Frequency   Intermittent    Aggravating Factors   tender to touch    Pain Relieving Factors  rest    Effect of Pain on Daily Activities  decreased transfer tolerance; no pain on Nustep             TREATMENT: Patient transferred squat pivot wheelchair<>nustep with BUE and therapist assistance, min A +1; Patient does require increased time for proper positioning and set up but was able to exhibit good safety with transfer; He requires cues for walking LLE foot around and required min A for placing LLE foot onto foot plate;  Following therapeutic activity: Instructed patient in Nustep, seat #15, arms #14 Pt instructed in BUE/BLE level3x40mnwith cues to increase speed to around 50 steps per minute to challenge cardiovascular endurance with cues to avoid stopping and to continue exercise. Distance propelled: 0.27 miles, HR after exercise 108 bpm;  Required cues to shift to right side for better midline positioning as patient often leans to left side;   Following therapeutic exercise: Transferred nustep to wheelchair squat pivotwith mod A +1 for safety;Patient required cues for hand placement and positioning. He had increased difficulty pivoting to wheelchair likely due to fatigue;  Patient is improving in LE AROM being able to place LLE foot on leg rest with cues and increased time;   Patient sitting in power chair with power chair elevated and tilted forward for better positioning:(seat height 27 inches from floor) Sit<>stand transfersinside parallel bars:  x3 reps;  He was able to transfer with min A to block LLE; upon standing patient exhibits heavy lean to RLE, requiring cues for weight shift to neutral position. [facility]. He was able to stand for up to 30 sec each repetition with increased fatigue.  With increased repetition patient expressed increased fatigue in BUE. He does exhibit better safety awareness with controlled sit down  upon fatigue.  He required min A for repositioning in chair at end of session. Reinforced HEP;                     PT Education - 01/09/19 1312    Education Details  LE strengthening, transfers, HEP    Person(s) Educated  Patient    Methods  Explanation    Comprehension  Verbalized understanding;Returned demonstration;Verbal cues required       PT Short Term Goals - 12/04/18 1303      PT SHORT TERM GOAL #1   Title  Be independent  with initial home exercise program for self-management of symptoms.    Baseline  HEP to be given at second session; advice to practice unsupported sitting at home (06/19/2018); 07/08/18: Performing at home, 6/29: needs assistance but is doing exercise program regularly; 08/22/18: adherent;    Time  2    Period  Weeks    Status  Achieved    Target Date  06/26/18        PT Long Term Goals - 12/04/18 1303      PT LONG TERM GOAL #1   Title  Patient will complete all bed mobility with min A to improve functional independence for getting in and out of bed and adjusting in bed.     Baseline  max A - total A reported by family (06/19/2018); 07/08/18: still requires maxA, dtr reports improvement in rolling since starting therapy; 6/23: min A to supervision in clinic; not doing at home right; 8/5: Pt's daughter states that his rolling has improved, but still has difficulty with all other bed mobility; 10/23/18: pts daughter states that she is doing about 35% of the work if he is in proper position, he does uses the bed rails to help roll;  using Hoyer for coming to sit EOB; 10/12: pts daughter states that she is doing about 30% of the work if he is in proper position, he does uses the bed rails to help roll, but it is improving;  using Hoyer for coming to sit EOB, 10/29: min A for sit to sidelying, rolling supervision, able to transition sidelying to sit with HHA;    Time  12    Period  Weeks    Status  Achieved      PT LONG TERM GOAL #2   Title   Patient will complete sit <> stand transfer chair to chair with Min A and LRAD to improve functional independence for household and community mobility.     Baseline  max A +2 STS at W/C (06/12/2018); max A +2-3 STS or stand pivot transfer, able to complete more reps (06/19/2018); 07/08/18: not safe to attempt at this time; 09/11/18: requires maxA+1 or modA+2 for STSs and squat pivot transfer; 10/23/18: requires maxA+1 or modA+2 for STSs and squat pivot transfer; 10/12: requires maxA+1 or modA+2 for STSs and squat pivot transfer; 10/29: patient able to complete sit<>Stand transfer min A +1 with parallel bars and RW, although inconsistent, mod A for stand pivot;    Time  12    Period  Weeks    Status  Partially Met    Target Date  02/27/19      PT LONG TERM GOAL #3   Title  Patient will navigate power w/c with min A x 100 feet to improve mobility for household and short community distances.    Baseline  required total A (06/12/2018); able to roll 20 feet with min A (06/20/2018); 07/08/18: Pt can go approximately 10' without assist per family;  10/23/2018: pt's daughter states that his power WC mobility has become 100% better with the new hand control, slowed speed, and with practice; 10/12: pt's daughter reports that he can perform household WC mobility and limited community ambulation with minA    Time  12    Period  Weeks    Status  Achieved      PT LONG TERM GOAL #4   Title  Patient will complete car transfers W/C to family SUV with min A using LRAD to improve his abilty to participate in  community activities.     Baseline  unable to complete car transfers at all (06/12/2018); unable to complete car transfers at all (06/19/2018).; 07/08/18: Unable to perform at this time; 09/11/18: unable to perform at this time; 10/23/2018: unable to complete car transfers at all; 10/12:  unable to complete car transfers at all, 10/29: unable    Time  12    Period  Weeks    Status  On-going    Target Date  02/27/19      PT LONG  TERM GOAL #5   Title  Patient will ambulate at least 150 feet with mod I using LRAD to improve mobility for household and community distances.     Baseline  unable to take steps (06/12/2018); able to shift R leg forward and back with max A and RW at edge of plinth (06/19/2018); 07/08/18: unable to ambulate at this time. 09/11/18: unable to perform at this time; 10/23/2018: unable to perform at this time; 10/12: unable to perform at this time, 10/29: unable at this time    Time  12    Period  Weeks    Status  On-going    Target Date  02/27/19      Additional Long Term Goals   Additional Long Term Goals  Yes      PT LONG TERM GOAL #6   Title  Patient will be able to safety navigate up/down ramp with power chair, exhibiting good safety awareness, mod I to safely enter/exit his home.    Baseline  unable to complet stairs (06/12/2018); unable to complete stairs (06/19/2018); 07/08/18: unable to attempt at this time; 09/11/18: unsafe to attempt at this time; 10/23/2018: unable to complet stairs (06/12/2018); unable to complete stairs (06/19/2018); 07/08/18: unable to attempt at this time; 09/11/18: unsafe to attempt at this time; 10/23/18: unsafe to attempt at this time, 10/29: requires superivison and occasional min A for safety    Time  12    Period  Weeks    Status  Revised    Target Date  02/27/19      PT LONG TERM GOAL #7   Title  Patient will increase functional reach test to >15 inches in sitting to exhibit improved sitting balance and positioning and reduce fall risk;    Baseline  07/30/18: 12 inches; 09/11/18: 10-12 inches; 10/23/18: 12-13 inch; 10/12: 14.5", 10/29: 15.5 inch    Time  4    Period  Weeks    Status  Achieved      PT LONG TERM GOAL #8   Title  Patient will increase LLE quad strength to 3+/5 to improve functional strength for standing and mobility;    Baseline  10/28: 2+/5    Time  12    Period  Weeks    Status  New    Target Date  02/27/19            Plan - 01/09/19 1316    Clinical  Impression Statement  Patient motivated and participated well within session; He was able to exercise on Nustep for longer time with increased distance completed. Patient does fatigue quickly requiring cues for weight shift and better trunk control to neutral position. Patient instructed in sit<>stand transfer. He was able to shift to midline with close supervision and hold standing position for 30 sec with good control. He does fatigue quickly with increased repetition. Patient would benefit from additional skilled PT intervention to improve strength, balance and gait safety;    Personal Factors  and Comorbidities  Age;Comorbidity 3+    Comorbidities  Relevant past medical history and comorbidities include long term steroid use for lupus, CKD, chronic osteomyelitis, cervical spine stenosis, BPH, APS, alcohol abuse, peripheral artery disease, depression, GERD, obstructive sleep apnea, HTN, IBS, IgA deficiency, lumbar radiculopathy, RA, Stroke, toxic maculopathy in both eyes, systemic lupus erythematosus related syndrome, cardiac catheterization, carotid endarterectomy, cervical laminectomy, coronary angioplasty, Fusion C5-C7, knee arthroplasty, lumbar surgery.    Examination-Activity Limitations  Bed Mobility;Bend;Sit;Toileting;Stand;Stairs;Lift;Transfers;Squat;Locomotion Level;Carry;Dressing;Hygiene/Grooming;Continence    Examination-Participation Restrictions  Yard Work;Interpersonal Relationship;Community Activity    Stability/Clinical Decision Making  Evolving/Moderate complexity    Rehab Potential  Fair    PT Frequency  3x / week    PT Duration  12 weeks    PT Treatment/Interventions  ADLs/Self Care Home Management;Electrical Stimulation;Moist Heat;Cryotherapy;Gait training;Stair training;Functional mobility training;Therapeutic exercise;Balance training;Neuromuscular re-education;Cognitive remediation;Patient/family education;Orthotic Fit/Training;Wheelchair mobility training;Manual techniques;Passive  range of motion;Energy conservation;Joint Manipulations    PT Next Visit Plan  Update goals, progress note; Work on strength, sitting/standing balance, functional activities such as rolling, bed mobility, transfers; caregiver training    PT Home Exercise Plan  handout provided via Beth during previous sessions    Consulted and Agree with Plan of Care  Patient;Family member/caregiver    Family Member Consulted  daughter [name]       Patient will benefit from skilled therapeutic intervention in order to improve the following deficits and impairments:  Abnormal gait, Decreased activity tolerance, Decreased cognition, Decreased endurance, Decreased knowledge of use of DME, Decreased range of motion, Decreased skin integrity, Decreased strength, Impaired perceived functional ability, Impaired sensation, Impaired UE functional use, Improper body mechanics, Pain, Cardiopulmonary status limiting activity, Decreased balance, Decreased coordination, Decreased mobility, Difficulty walking, Impaired tone, Postural dysfunction  Visit Diagnosis: Muscle weakness (generalized)  Other lack of coordination  Difficulty in walking, not elsewhere classified     Problem List Patient Active Problem List   Diagnosis Date Noted  . Sepsis ([place]) 10/18/2018    Trotter,Margaret PT, DPT 01/09/2019, 3:27 PM  [place] [facility] SERVICES [address] [place], [place], [address] Phone: [phone]   Fax:  [phone]  Name: NICKALAUS CROOKE MRN: [id number] Date of Birth: Jun 26, 1945

## 2019-01-13 ENCOUNTER — Other Ambulatory Visit: Payer: Self-pay

## 2019-01-13 ENCOUNTER — Ambulatory Visit: Payer: Medicare Other

## 2019-01-13 ENCOUNTER — Ambulatory Visit: Payer: Medicare Other | Admitting: Occupational Therapy

## 2019-01-13 ENCOUNTER — Encounter: Payer: Self-pay | Admitting: Occupational Therapy

## 2019-01-13 DIAGNOSIS — R278 Other lack of coordination: Secondary | ICD-10-CM

## 2019-01-13 DIAGNOSIS — M6281 Muscle weakness (generalized): Secondary | ICD-10-CM

## 2019-01-13 DIAGNOSIS — R262 Difficulty in walking, not elsewhere classified: Secondary | ICD-10-CM

## 2019-01-13 NOTE — Therapy (Signed)
[place] MAIN [facility] SERVICES [address] [place], [place], [address] Phone: [phone]   Fax:  [phone]  Occupational Therapy Treatment/Recertification Note  Patient Details  Name: Kenneth Summers MRN: [id number] Date of Birth: 06-18-1945 No data recorded  Encounter Date: 01/13/2019  OT End of Session - 01/13/19 1405    Visit Number  68    Number of Visits  54    Date for OT Re-Evaluation  04/07/19    Authorization Type  Progress report period starting 12/26/2018    OT Start Time  1300    OT Stop Time  1345    OT Time Calculation (min)  45 min    Activity Tolerance  Patient tolerated treatment well;Patient limited by fatigue    Behavior During Therapy  [facility] for tasks assessed/performed       Past Medical History:  Diagnosis Date  . Alcohol abuse   . APS (antiphospholipid syndrome) ([place])   . BPH (benign prostatic hyperplasia)   . CAD (coronary artery disease)   . Cellulitis   . Cervical spinal stenosis   . Chronic osteomyelitis ([place])   . CKD (chronic kidney disease)   . CKD (chronic kidney disease)   . Clostridium difficile diarrhea   . Depression   . Diplopia   . Fatigue   . GERD (gastroesophageal reflux disease)   . Hyperlipemia   . Hypertension   . Hypovitaminosis D   . IBS (irritable bowel syndrome)   . 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 Insomnia   . Left lumbosacral radiculopathy   . Moderate obstructive sleep apnea   . Osteomyelitis of foot ([place])   . PAD (peripheral artery disease) ([place])   . Pruritus   . RA (rheumatoid arthritis) ([place])   . Radiculopathy   . Restless legs syndrome   . RLS (restless legs syndrome)   . SLE (systemic lupus erythematosus related syndrome) ([place])   . Stroke ([place])   . Toxic maculopathy of both eyes     Past Surgical History:  Procedure Laterality Date  . CARDIAC CATHETERIZATION    . CAROTID ENDARTERECTOMY    . CERVICAL LAMINECTOMY    . CORONARY ANGIOPLASTY    . FRACTURE SURGERY    .  fusion C5-6-7    . HERNIA REPAIR    . KNEE ARTHROSCOPY    . TONSILLECTOMY      There were no vitals filed for this visit.  Subjective Assessment - 01/13/19 1404    Subjective   Pt. was present with his daughter.    Patient is accompanied by:  Family member    Pertinent History  Pt. is a 73 y.o. 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 Pt. PMHx includes: Multiple Falls, Lupus, DJD, Renal Abscess, CVA. Pt. resides with his wife. Pt.'s wife and daughter assist with ADLs. Pt. has caregivers in  for 2 hours a day, 6 days a week. Pt. received Rehab services in acute care, at SNF for STR, and [facility] services. Pt. is retired from [employer] for [employer] and [employer].    Patient Stated Goals  To regain use of his left UE, and do more for himself.    Currently in Pain?  No/denies         [facility] OT Assessment - 01/13/19 1307      Coordination   Right 9 Hole Peg Test  45 sec.    Left 9 Hole Peg Test  59  sec.      AROM   Overall AROM Comments  Left shoulder flexion 123, abduction: 90, elbow  10-142 (0-142)      Strength   Overall Strength Comments  LUE strength 3+/5      Hand Function   Right Hand Grip (lbs)  30    Right Hand Lateral Pinch  12 lbs    Right Hand 3 Point Pinch  7 lbs    Left Hand Grip (lbs)  25    Left Hand Lateral Pinch  10 lbs    Left 3 point pinch  9 lbs      Measurements were obtained, and goals were reviewed with the pt.                 OT Education - 01/13/19 1405    Education Details  UE functioning    Person(s) Educated  Patient    Methods  Explanation;Demonstration    Comprehension  Returned demonstration;Tactile cues required;Verbalized understanding;Need further instruction          OT Long Term Goals - 01/13/19 1325      OT LONG TERM GOAL #1   Title  Pt. will increase left shoulder flexion AROM by 10 degrees to access cabinet/shelf.    Baseline  Pt. is improving with LUE ROM, and  functional reaching.    Time  12    Period  Weeks    Status  On-going    Target Date  04/07/19      OT LONG TERM GOAL #2   Title  Pt. will donn a shirt with Supervision.    Baseline  MinA donning a zip down jacket    Time  12    Period  Weeks    Status  On-going    Target Date  04/07/19      OT LONG TERM GOAL #3   Title  Pt. will require ModA to perform LE dressing    Baseline  MaxA left, ModA with the right. Pt. has sores on his ankle, and feet. Pt.'s daughter is assisting.    Time  12    Period  Weeks    Status  Deferred    Target Date  01/15/20      OT LONG TERM GOAL #4   Title  Pt. will improve left grip strength by 10# to be able to open a jar/container.    Baseline  pt. is making excellent progress with left grip strength, continues to work onopening jars, and containers.    Time  12    Period  Weeks    Status  On-going    Target Date  04/07/19      OT LONG TERM GOAL #5   Title  Pt. will improve left hand [facility] skills to be able to assist with buttoning/zipping.    Baseline  Pt. has made excellent progress with bilateral [facility] skills. Pt. continues to work on improving bilateral [facility] skills for buttoning/zipping    Time  12    Period  Weeks    Status  On-going    Target Date  04/07/19      Long Term Additional Goals   Additional Long Term Goals  Yes      OT LONG TERM GOAL #6   Title  Pt. will demonstrate visual conmpensatory strategies during 100% of the time during ADLs    Baseline  Pt. requires cues at times for left sides awareness.    Time  12  Period  Weeks    Status  On-going    Target Date  04/07/19      OT LONG TERM GOAL #7   Title  Pt. will prepare a simple cold snack from w/c with supervision using cognitive compensatory strategies 100% of the time.    Baseline  Pt. has difficulty with getting the w/c close enough to access the refrigerator    Time  12    Period  Weeks    Status  Deferred    Target Date  04/07/19      OT LONG TERM GOAL #8   Title   Pt. will accurately identify potential safety hazard using good safety awareness, and judgement 100% for ADLs, and IADLs.    Baseline  Pt. continues to be limited    Time  12    Period  Weeks    Status  On-going    Target Date  04/07/19      OT LONG TERM GOAL  #9   TITLE  Pt. will  navigate his w/c around obstacles with Supervision and 100% accuracy    Baseline  Pt. continues to require consistent cuing for obstacle on the left    Time  12    Period  Weeks    Status  On-going    Target Date  04/07/19      OT LONG TERM GOAL  #11   TITLE  Pt. will increase BUE strength by 67mm grades in preparation for ADL transfers.    Baseline  Limited BUE strength    Time  12    Period  Weeks    Status  New    Target Date  04/07/19            Plan - 01/13/19 1405    Clinical Impression Statement Pt. is making excellent progress with left shoulder AROM, grip strength, pinch strength, and [place] skills.  Pt. is now appropriate for change of frequency to 2x's a week. Pt. continues to present with limited attention making it difficult to consistently maneuver to his w/c independently through doorways. Pt. continues to work on improving LUE strength, and [facility] skills in order to improve functional reaching, and increase engagement during ADLs, and IADL tasks, UE dressing, zipping, buttoning, reaching into cabinetry, and navigating his w/c around obstacles.   OT Occupational Profile and History  Problem Focused Assessment - Including review of records relating to presenting problem    Occupational performance deficits (Please refer to evaluation for details):  ADL's    Body Structure / Function / Physical Skills  UE functional use;Coordination;FMC;Dexterity;Strength;ROM    Cognitive Skills  Attention;Memory;Emotional;Problem Solve;Safety Awareness    Rehab Potential  Good    Clinical Decision Making  Several treatment options, min-mod task modification necessary    Comorbidities Affecting Occupational  Performance:  Presence of comorbidities impacting occupational performance    Comorbidities impacting occupational performance description:  Phyical, cognitive, visual,  medical comorbidities    Modification or Assistance to Complete Evaluation   No modification of tasks or assist necessary to complete eval    OT Frequency  2x / week    OT Duration  12 weeks    OT Treatment/Interventions  Self-care/ADL training;DME and/or AE instruction;Therapeutic exercise;Therapeutic activities;Moist Heat;Cognitive remediation/compensation;Neuromuscular education;Visual/perceptual remediation/compensation;Coping strategies training;Patient/family education;Passive range of motion;Psychosocial skills training;Energy conservation;Functional Mobility Training    Consulted and Agree with Plan of Care  Patient    Family Member Consulted  Daughter [name]       Patient will  benefit from skilled therapeutic intervention in order to improve the following deficits and impairments:   Body Structure / Function / Physical Skills: UE functional use, Coordination, FMC, Dexterity, Strength, ROM Cognitive Skills: Attention, Memory, Emotional, Problem Solve, Safety Awareness     Visit Diagnosis: Muscle weakness (generalized)  Other lack of coordination    Problem List Patient Active Problem List   Diagnosis Date Noted  . Sepsis ([place]) 10/18/2018    Harrel Carina, MS, OTR/L 01/13/2019, 2:26 PM  [place] MAIN [facility] SERVICES [address] [place], [place], [address] Phone: [phone]   Fax:  [phone]  Name: Kenneth Summers MRN: [id number] Date of Birth: [birth year]

## 2019-01-13 NOTE — Therapy (Signed)
[place] MAIN [facility] SERVICES [address] [place], [place], [address] Phone: [phone]   Fax:  [phone]  Physical Therapy Progress Note/Treatment   Dates of reporting period  12/16/18   to  01/14/19  Patient Details  Name: Kenneth Summers MRN: [id number] Date of Birth: 04-24-1945 Referring Provider (PT): Dr. [name], Guy Begin   Encounter Date: 01/13/2019  PT End of Session - 01/13/19 1720    Visit Number  [id number]    Number of Visits  108    Date for PT Re-Evaluation  02/27/19    Authorization Type  Medicare reporting period starting 09/11/18    Authorization Time Period  goals last assessed 12/04/18    PT Start Time  1300    PT Stop Time  1345    PT Time Calculation (min)  45 min    Equipment Utilized During Treatment  Gait belt    Activity Tolerance  Patient tolerated treatment well    Behavior During Therapy  [facility] for tasks assessed/performed       Past Medical History:  Diagnosis Date  . Alcohol abuse   . APS (antiphospholipid syndrome) ([place])   . BPH (benign prostatic hyperplasia)   . CAD (coronary artery disease)   . Cellulitis   . Cervical spinal stenosis   . Chronic osteomyelitis ([place])   . CKD (chronic kidney disease)   . CKD (chronic kidney disease)   . Clostridium difficile diarrhea   . Depression   . Diplopia   . Fatigue   . GERD (gastroesophageal reflux disease)   . Hyperlipemia   . Hypertension   . Hypovitaminosis D   . IBS (irritable bowel syndrome)   . 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 Insomnia   . Left lumbosacral radiculopathy   . Moderate obstructive sleep apnea   . Osteomyelitis of foot ([place])   . PAD (peripheral artery disease) ([place])   . Pruritus   . RA (rheumatoid arthritis) ([place])   . Radiculopathy   . Restless legs syndrome   . RLS (restless legs syndrome)   . SLE (systemic lupus erythematosus related syndrome) ([place])   . Stroke ([place])   . Toxic maculopathy of both eyes     Past Surgical History:   Procedure Laterality Date  . CARDIAC CATHETERIZATION    . CAROTID ENDARTERECTOMY    . CERVICAL LAMINECTOMY    . CORONARY ANGIOPLASTY    . FRACTURE SURGERY    . fusion C5-6-7    . HERNIA REPAIR    . KNEE ARTHROSCOPY    . TONSILLECTOMY      There were no vitals filed for this visit.  Subjective Assessment - 01/13/19 1719    Subjective  Patient reports doing well today. He has no complaints today and denies any pain upon arrival. No specific questions or concerns from either patient or daughter. No new falls reported.    Patient is accompained by:  Family member    Pertinent History  Patient is a 73 y.o. male who presents to outpatient physical therapy with a referral for medical diagnosis of CVA. This patient's chief complaints consist of left hemiplegia and overall deconditioning leading to the following functional deficits: dependent for ADLs, IADLs, unable to transfer to car, dependent transfers at home with hoyer lift, unable to walk or stand without significant assistance, difficulty with W/C navigation..    Limitations  Sitting;Lifting;Standing;House hold activities;Writing;Walking;Other (comment)    Diagnostic tests  Brain MRI 11/01/2017: IMPRESSION: 1. Acute right ACA  territory infarct as demonstrated on prior CT imaging. No intracranial hemorrhage or significant mass effect. 2. 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    Currently in Pain?  No/denies         TREATMENT   Therapeutic Activity STS transfer from power wheelchair outside of parallel bars. Pt requiring CGA for first transfer and minA+1 for the following 2 stands. He requires repeated cues for posture in standing and to remain upright for longer periods. No blocking of knees required at this time and pt demonstrates improved weight acceptance to LLE. Pt stayed in standing for 80s, 60s, 30s for attempts 1 through 3 respectively;      Practiced sliding board transfers x 4 to/from  power chair and mat table. Education provided to daughter during sliding board transfers. Pt demonstrates improved ability to slide without assistance especially back to his power chair toward the right when he can use his RUE to hold onto the arm rest of the chair. Pt requires repeated cues to lean forward during sliding board transfer as he tends to want to extend his trunk   Ther-ex (following therapeutic activity) Seated knee extensions x10 bilaterally Seated hip flexion x10 bilaterally Seated hip abduction x10 bilaterally Seated hip adduction x10 bilaterally   Pt educated throughout session about proper posture and technique with exercises. Improved exercise technique, movement at target joints, use of target muscles after min to mod verbal, visual, tactile cues.    Patientdemonstrates good motivation throughout today's session. Updated goals with patient and he demonstrates improved strength with sit to stand transfers. He is also improving in his standing endurance and weight shifting to his left side. He is still unable to perform sit to stand transfers safely to/from car but practiced sliding board transfers today with patient and daughter. He demonstrates improved ability to slide without assistance especially back to his power chair toward the right when he can use his RUE to hold onto the arm rest of the chair. Pt requires repeated cues to lean forward during sliding board transfer as he tends to want to extend his trunk. Discussed possibly practicing car slide board transfers with patient during a session however it depends on how his car is set up and whether it will allow his power chair to get close enough to the seat for a sliding board.Pt will benefit from continued skilled PT services in order to increase strength and functional mobilityin order to decrease caregiver burden, improve his ability to perform ADLs, and to improve his  QOL.                          PT Short Term Goals - 01/13/19 1721      PT SHORT TERM GOAL #1   Title  Be independent with initial home exercise program for self-management of symptoms.    Baseline  HEP to be given at second session; advice to practice unsupported sitting at home (06/19/2018); 07/08/18: Performing at home, 6/29: needs assistance but is doing exercise program regularly; 08/22/18: adherent;    Time  2    Period  Weeks    Status  Achieved    Target Date  06/26/18        PT Long Term Goals - 01/13/19 1721      PT LONG TERM GOAL #1   Title  Patient will complete all bed mobility with min A to improve functional independence for getting in and out  of bed and adjusting in bed.     Baseline  max A - total A reported by family (06/19/2018); 07/08/18: still requires maxA, dtr reports improvement in rolling since starting therapy; 6/23: min A to supervision in clinic; not doing at home right; 8/5: Pt's daughter states that his rolling has improved, but still has difficulty with all other bed mobility; 10/23/18: pts daughter states that she is doing about 35% of the work if he is in proper position, he does uses the bed rails to help roll;  using Hoyer for coming to sit EOB; 10/12: pts daughter states that she is doing about 30% of the work if he is in proper position, he does uses the bed rails to help roll, but it is improving;  using Hoyer for coming to sit EOB, 10/29: min A for sit to sidelying, rolling supervision, able to transition sidelying to sit with HHA;    Time  12    Period  Weeks    Status  Achieved      PT LONG TERM GOAL #2   Title  Patient will complete sit <> stand transfer chair to chair with Min A and LRAD to improve functional independence for household and community mobility.     Baseline  max A +2 STS at W/C (06/12/2018); max A +2-3 STS or stand pivot transfer, able to complete more reps (06/19/2018); 07/08/18: not safe to attempt at this time; 09/11/18:  requires maxA+1 or modA+2 for STSs and squat pivot transfer; 10/23/18: requires maxA+1 or modA+2 for STSs and squat pivot transfer; 10/12: requires maxA+1 or modA+2 for STSs and squat pivot transfer; 10/29: patient able to complete sit<>Stand transfer min A +1 with parallel bars and RW, although inconsistent, mod A for stand pivot; 01/13/19: Pt able to perform sit to stand with parallel bars and RW with minA+1 and a few times with CGA only, still requires modA+1 for stand pivot transfer.    Time  12    Period  Weeks    Status  Partially Met    Target Date  02/27/19      PT LONG TERM GOAL #3   Title  Patient will navigate power w/c with min A x 100 feet to improve mobility for household and short community distances.    Baseline  required total A (06/12/2018); able to roll 20 feet with min A (06/20/2018); 07/08/18: Pt can go approximately 10' without assist per family;  10/23/2018: pt's daughter states that his power WC mobility has become 100% better with the new hand control, slowed speed, and with practice; 10/12: pt's daughter reports that he can perform household WC mobility and limited community ambulation with minA    Time  12    Period  Weeks    Status  Achieved      PT LONG TERM GOAL #4   Title  Patient will complete car transfers W/C to family SUV with min A using LRAD to improve his abilty to participate in community activities.     Baseline  unable to complete car transfers at all (06/12/2018); unable to complete car transfers at all (06/19/2018).; 07/08/18: Unable to perform at this time; 09/11/18: unable to perform at this time; 10/23/2018: unable to complete car transfers at all; 10/12:  unable to complete car transfers at all, 10/29: unable; 01/13/19: unable;    Time  12    Period  Weeks    Status  On-going    Target Date  02/27/19  PT LONG TERM GOAL #5   Title  Patient will ambulate at least 150 feet with mod I using LRAD to improve mobility for household and community distances.      Baseline  unable to take steps (06/12/2018); able to shift R leg forward and back with max A and RW at edge of plinth (06/19/2018); 07/08/18: unable to ambulate at this time. 09/11/18: unable to perform at this time; 10/23/2018: unable to perform at this time; 10/12: unable to perform at this time, 10/29: unable at this time; 01/13/19: unable at this time    Time  12    Period  Weeks    Status  On-going    Target Date  02/27/19      PT LONG TERM GOAL #6   Title  Patient will be able to safety navigate up/down ramp with power chair, exhibiting good safety awareness, mod I to safely enter/exit his home.    Baseline  unable to complet stairs (06/12/2018); unable to complete stairs (06/19/2018); 07/08/18: unable to attempt at this time; 09/11/18: unsafe to attempt at this time; 10/23/2018: unable to complet stairs (06/12/2018); unable to complete stairs (06/19/2018); 07/08/18: unable to attempt at this time; 09/11/18: unsafe to attempt at this time; 10/23/18: unsafe to attempt at this time, 10/29: requires superivison and occasional min A for safety; 01/13/19: unchanged from last update    Time  12    Period  Weeks    Status  On-going    Target Date  02/27/19      PT LONG TERM GOAL #7   Title  Patient will increase functional reach test to >15 inches in sitting to exhibit improved sitting balance and positioning and reduce fall risk;    Baseline  07/30/18: 12 inches; 09/11/18: 10-12 inches; 10/23/18: 12-13 inch; 10/12: 14.5", 10/29: 15.5 inch    Time  4    Period  Weeks    Status  Achieved      PT LONG TERM GOAL #8   Title  Patient will increase LLE quad strength to 3+/5 to improve functional strength for standing and mobility;    Baseline  10/28: 2+/5; 01/13/19: 2+/5    Time  12    Period  Weeks    Status  On-going    Target Date  02/27/19            Plan - 01/13/19 1720    Clinical Impression Statement  Patient demonstrates good motivation throughout today's session. Updated goals with patient and he  demonstrates improved strength with sit to stand transfers. He is also improving in his standing endurance and weight shifting to his left side. He is still unable to perform sit to stand transfers safely to/from car but practiced sliding board transfers today with patient and daughter. He demonstrates improved ability to slide without assistance especially back to his power chair toward the right when he can use his RUE to hold onto the arm rest of the chair. Pt requires repeated cues to lean forward during sliding board transfer as he tends to want to extend his trunk. Discussed possibly practicing car slide board transfers with patient during a session however it depends on how his car is set up and whether it will allow his power chair to get close enough to the seat for a sliding board. Pt will benefit from continued skilled PT services in order to increase strength and functional mobility in order to decrease caregiver burden, improve his ability to perform ADLs, and  to improve his QOL.    Personal Factors and Comorbidities  Age;Comorbidity 3+    Comorbidities  Relevant past medical history and comorbidities include long term steroid use for lupus, CKD, chronic osteomyelitis, cervical spine stenosis, BPH, APS, alcohol abuse, peripheral artery disease, depression, GERD, obstructive sleep apnea, HTN, IBS, IgA deficiency, lumbar radiculopathy, RA, Stroke, toxic maculopathy in both eyes, systemic lupus erythematosus related syndrome, cardiac catheterization, carotid endarterectomy, cervical laminectomy, coronary angioplasty, Fusion C5-C7, knee arthroplasty, lumbar surgery.    Examination-Activity Limitations  Bed Mobility;Bend;Sit;Toileting;Stand;Stairs;Lift;Transfers;Squat;Locomotion Level;Carry;Dressing;Hygiene/Grooming;Continence    Examination-Participation Restrictions  Yard Work;Interpersonal Relationship;Community Activity    Stability/Clinical Decision Making  Evolving/Moderate complexity    Rehab  Potential  Fair    PT Frequency  3x / week    PT Duration  12 weeks    PT Treatment/Interventions  ADLs/Self Care Home Management;Electrical Stimulation;Moist Heat;Cryotherapy;Gait training;Stair training;Functional mobility training;Therapeutic exercise;Balance training;Neuromuscular re-education;Cognitive remediation;Patient/family education;Orthotic Fit/Training;Wheelchair mobility training;Manual techniques;Passive range of motion;Energy conservation;Joint Manipulations    PT Next Visit Plan  Work on strength, sitting/standing balance, functional activities such as rolling, bed mobility, transfers; caregiver training    PT Home Exercise Plan  handout provided via Beth during previous sessions    Consulted and Agree with Plan of Care  Patient;Family member/caregiver    Family Member Consulted  daughter [name]       Patient will benefit from skilled therapeutic intervention in order to improve the following deficits and impairments:  Abnormal gait, Decreased activity tolerance, Decreased cognition, Decreased endurance, Decreased knowledge of use of DME, Decreased range of motion, Decreased skin integrity, Decreased strength, Impaired perceived functional ability, Impaired sensation, Impaired UE functional use, Improper body mechanics, Pain, Cardiopulmonary status limiting activity, Decreased balance, Decreased coordination, Decreased mobility, Difficulty walking, Impaired tone, Postural dysfunction  Visit Diagnosis: Muscle weakness (generalized)  Other lack of coordination  Difficulty in walking, not elsewhere classified     Problem List Patient Active Problem List   Diagnosis Date Noted  . Sepsis ([place]) 10/18/2018   Phillips Grout PT, DPT, GCS  Huprich,Jason 01/14/2019, 12:36 PM  [place] MAIN [facility] SERVICES [address] [place], [place], [address] Phone: [phone]   Fax:  [phone]  Name: ALEKSANDER EDMISTON MRN: [id number] Date of  Birth: [birth year]

## 2019-01-15 ENCOUNTER — Encounter: Payer: Self-pay | Admitting: Occupational Therapy

## 2019-01-15 ENCOUNTER — Encounter: Payer: Self-pay | Admitting: Physical Therapy

## 2019-01-15 ENCOUNTER — Ambulatory Visit: Payer: Medicare Other | Admitting: Occupational Therapy

## 2019-01-15 ENCOUNTER — Other Ambulatory Visit: Payer: Self-pay

## 2019-01-15 ENCOUNTER — Ambulatory Visit: Payer: Medicare Other | Admitting: Physical Therapy

## 2019-01-15 DIAGNOSIS — M6281 Muscle weakness (generalized): Secondary | ICD-10-CM | POA: Diagnosis not present

## 2019-01-15 DIAGNOSIS — R278 Other lack of coordination: Secondary | ICD-10-CM

## 2019-01-15 DIAGNOSIS — R262 Difficulty in walking, not elsewhere classified: Secondary | ICD-10-CM

## 2019-01-15 NOTE — Therapy (Signed)
[place] MAIN [facility] SERVICES [address] [place], [place], [address] Phone: [phone]   Fax:  [phone]  Occupational Therapy Treatment  Patient Details  Name: Kenneth Summers MRN: [id number] Date of Birth: 1945/05/16 No data recorded  Encounter Date: 01/15/2019  OT End of Session - 01/15/19 1506    Visit Number  43    Number of Visits  22    Date for OT Re-Evaluation  04/07/19    Authorization Type  Progress report period starting 12/26/2018    OT Start Time  1353    OT Stop Time  1430    OT Time Calculation (min)  37 min    Activity Tolerance  Patient tolerated treatment well;Patient limited by fatigue    Behavior During Therapy  [facility] for tasks assessed/performed       Past Medical History:  Diagnosis Date  . Alcohol abuse   . APS (antiphospholipid syndrome) ([place])   . BPH (benign prostatic hyperplasia)   . CAD (coronary artery disease)   . Cellulitis   . Cervical spinal stenosis   . Chronic osteomyelitis ([place])   . CKD (chronic kidney disease)   . CKD (chronic kidney disease)   . Clostridium difficile diarrhea   . Depression   . Diplopia   . Fatigue   . GERD (gastroesophageal reflux disease)   . Hyperlipemia   . Hypertension   . Hypovitaminosis D   . IBS (irritable bowel syndrome)   . 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 Insomnia   . Left lumbosacral radiculopathy   . Moderate obstructive sleep apnea   . Osteomyelitis of foot ([place])   . PAD (peripheral artery disease) ([place])   . Pruritus   . RA (rheumatoid arthritis) ([place])   . Radiculopathy   . Restless legs syndrome   . RLS (restless legs syndrome)   . SLE (systemic lupus erythematosus related syndrome) ([place])   . Stroke ([place])   . Toxic maculopathy of both eyes     Past Surgical History:  Procedure Laterality Date  . CARDIAC CATHETERIZATION    . CAROTID ENDARTERECTOMY    . CERVICAL LAMINECTOMY    . CORONARY ANGIOPLASTY    . FRACTURE SURGERY    . fusion C5-6-7    .  HERNIA REPAIR    . KNEE ARTHROSCOPY    . TONSILLECTOMY      There were no vitals filed for this visit.  Subjective Assessment - 01/15/19 1505    Subjective   Pt. was present with his daughter.    Patient is accompanied by:  Family member    Pertinent History  Pt. is a 73 y.o. 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 Pt. PMHx includes: Multiple Falls, Lupus, DJD, Renal Abscess, CVA. Pt. resides with his wife. Pt.'s wife and daughter assist with ADLs. Pt. has caregivers in  for 2 hours a day, 6 days a week. Pt. received Rehab services in acute care, at SNF for STR, and [place] services. Pt. is retired from [employer] for [employer] and [employer].    Currently in Pain?  No/denies      OT TREATMENT    Neuro muscular re-education:  Pt. Performed LUE resistive EZ Board exercises for forearm supination/pronation, wrist flexion/extension using gross grasp, and lateral pinch (key) grasp. Pt. performed resistive EZ Board exercises angled in several planes to promote shoulder flexion, abduction, and wrist flexion, and extension while performing resistive wrist flexion and  extension with a gross grip. Pt. worked on grasping 1" resistive cubes alternating thumb opposition to the tip of the 2nd through 5th digits while the board is placed at a vertical angle. Pt. worked on pressing the cubes back into place while alternating isolated 2nd through 5th digit extension.Pt. performed [facility] tasks using the Grooved pegboard. Pt. worked on using his left hand for grasping the grooved pegs from a horizontal position, and moving the pegs to a vertical position in the hand to prepare for placing them in the grooved slot.   Response to Treatment:  Pt. is making steady progress overall with his LUE functioning. Pt. required cues, and assist to initiate with his LUE, and perform LUE functioning during tasks. Pt. required verbal cues, cues for visual demonstration, and increased time  to move, and turn the pegs in the tips of digits to be able to fit them into the grooved pegboard at the correct angle. Pt. required cues to turn the pegs from a horizontal position, to a vertical position.                        OT Education - 01/15/19 1506    Education Details  UE functioning    Person(s) Educated  Patient    Methods  Explanation;Demonstration    Comprehension  Returned demonstration;Tactile cues required;Verbalized understanding;Need further instruction          OT Long Term Goals - 01/13/19 1325      OT LONG TERM GOAL #1   Title  Pt. will increase left shoulder flexion AROM by 10 degrees to access cabinet/shelf.    Baseline  Pt. is improving with LUE ROM, and functional reaching.    Time  12    Period  Weeks    Status  On-going    Target Date  04/07/19      OT LONG TERM GOAL #2   Title  Pt. will donn a shirt with Supervision.    Baseline  MinA donning a zip down jacket    Time  12    Period  Weeks    Status  On-going    Target Date  04/07/19      OT LONG TERM GOAL #3   Title  Pt. will require ModA to perform LE dressing    Baseline  MaxA left, ModA with the right. Pt. has sores on his ankle, and feet. Pt.'s daughter is assisting.    Time  12    Period  Weeks    Status  Deferred    Target Date  01/15/20      OT LONG TERM GOAL #4   Title  Pt. will improve left grip strength by 10# to be able to open a jar/container.    Baseline  pt. is making excellent progress with left grip strength, continues to work onopening jars, and containers.    Time  12    Period  Weeks    Status  On-going    Target Date  04/07/19      OT LONG TERM GOAL #5   Title  Pt. will improve left hand [facility] skills to be able to assist with buttoning/zipping.    Baseline  Pt. has made excellent progress with bilateral [facility] skills. Pt. continues to work on improving bilateral [facility] skills for buttoning/zipping    Time  12    Period  Weeks    Status  On-going     Target Date  04/07/19      Long Term Additional Goals   Additional Long Term Goals  Yes      OT LONG TERM GOAL #6   Title  Pt. will demonstrate visual conmpensatory strategies during 100% of the time during ADLs    Baseline  Pt. requires cues at times for left sides awareness.    Time  12    Period  Weeks    Status  On-going    Target Date  04/07/19      OT LONG TERM GOAL #7   Title  Pt. will prepare a simple cold snack from w/c with supervision using cognitive compensatory strategies 100% of the time.    Baseline  Pt. has difficulty with getting the w/c close enough to access the refrigerator    Time  12    Period  Weeks    Status  Deferred    Target Date  04/07/19      OT LONG TERM GOAL #8   Title  Pt. will accurately identify potential safety hazard using good safety awareness, and judgement 100% for ADLs, and IADLs.    Baseline  Pt. continues to be limited    Time  12    Period  Weeks    Status  On-going    Target Date  04/07/19      OT LONG TERM GOAL  #9   TITLE  Pt. will  navigate his w/c around obstacles with Supervision and 100% accuracy    Baseline  Pt. continues to require consistent cuing for obstacle on the left    Time  12    Period  Weeks    Status  On-going    Target Date  04/07/19      OT LONG TERM GOAL  #11   TITLE  Pt. will increase BUE strength by 52mm grades in preparation for ADL transfers.    Baseline  Limited BUE strength    Time  12    Period  Weeks    Status  New    Target Date  04/07/19            Plan - 01/15/19 1507    Clinical Impression Statement Pt. is making steady progress overall with his LUE functioning. Pt. required cues, and assist to initiate with his LUE, and perform LUE functioning during tasks. Pt. required verbal cues, cues for visual demonstration, and increased time to move, and turn the pegs in the tips of digits to be able to fit them into the grooved pegboard at the correct angle. Pt. required cues to turn the pegs  from a horizontal position, to a vertical position.    OT Occupational Profile and History  Problem Focused Assessment - Including review of records relating to presenting problem    Occupational performance deficits (Please refer to evaluation for details):  ADL's    Body Structure / Function / Physical Skills  UE functional use;Coordination;FMC;Dexterity;Strength;ROM    Cognitive Skills  Attention;Memory;Emotional;Problem Solve;Safety Awareness    Rehab Potential  Good    Clinical Decision Making  Several treatment options, min-mod task modification necessary    Comorbidities Affecting Occupational Performance:  Presence of comorbidities impacting occupational performance    Comorbidities impacting occupational performance description:  Phyical, cognitive, visual,  medical comorbidities    Modification or Assistance to Complete Evaluation   No modification of tasks or assist necessary to complete eval    OT Frequency  2x / week    OT Duration  12 weeks    OT Treatment/Interventions  Self-care/ADL training;DME and/or AE instruction;Therapeutic exercise;Therapeutic activities;Moist Heat;Cognitive remediation/compensation;Neuromuscular education;Visual/perceptual remediation/compensation;Coping strategies training;Patient/family education;Passive range of motion;Psychosocial skills training;Energy conservation;Functional Mobility Training    Consulted and Agree with Plan of Care  Patient    Family Member Consulted  Daughter [name]       Patient will benefit from skilled therapeutic intervention in order to improve the following deficits and impairments:   Body Structure / Function / Physical Skills: UE functional use, Coordination, FMC, Dexterity, Strength, ROM Cognitive Skills: Attention, Memory, Emotional, Problem Solve, Safety Awareness     Visit Diagnosis: Muscle weakness (generalized)  Other lack of coordination    Problem List Patient Active Problem List   Diagnosis Date Noted   . Sepsis ([place]) 10/18/2018    Harrel Carina, MS, OTR/L 01/15/2019, 4:03 PM  [place] MAIN [facility] SERVICES [address] [place], [place], [address] Phone: [phone]   Fax:  [phone]  Name: Kenneth Summers MRN: [id number] Date of Birth: 12/25/1945

## 2019-01-15 NOTE — Therapy (Signed)
[place] MAIN [facility] SERVICES [address] [place], [place], [address] Phone: [phone]   Fax:  [phone]  Physical Therapy Treatment  Patient Details  Name: Kenneth Summers MRN: [id number] Date of Birth: 05-22-1945 Referring Provider (PT): Dr. [name], Guy Begin   Encounter Date: 01/15/2019  PT End of Session - 01/15/19 1311    Visit Number  [id number]    Number of Visits  108    Date for PT Re-Evaluation  02/27/19    Authorization Type  Medicare reporting period starting 09/11/18    Authorization Time Period  goals last assessed 12/04/18    PT Start Time  1301    PT Stop Time  1345    PT Time Calculation (min)  44 min    Equipment Utilized During Treatment  Gait belt    Activity Tolerance  Patient tolerated treatment well    Behavior During Therapy  [facility] for tasks assessed/performed       Past Medical History:  Diagnosis Date  . Alcohol abuse   . APS (antiphospholipid syndrome) ([place])   . BPH (benign prostatic hyperplasia)   . CAD (coronary artery disease)   . Cellulitis   . Cervical spinal stenosis   . Chronic osteomyelitis ([place])   . CKD (chronic kidney disease)   . CKD (chronic kidney disease)   . Clostridium difficile diarrhea   . Depression   . Diplopia   . Fatigue   . GERD (gastroesophageal reflux disease)   . Hyperlipemia   . Hypertension   . Hypovitaminosis D   . IBS (irritable bowel syndrome)   . 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 Insomnia   . Left lumbosacral radiculopathy   . Moderate obstructive sleep apnea   . Osteomyelitis of foot ([place])   . PAD (peripheral artery disease) ([place])   . Pruritus   . RA (rheumatoid arthritis) ([place])   . Radiculopathy   . Restless legs syndrome   . RLS (restless legs syndrome)   . SLE (systemic lupus erythematosus related syndrome) ([place])   . Stroke ([place])   . Toxic maculopathy of both eyes     Past Surgical History:  Procedure Laterality Date  . CARDIAC CATHETERIZATION    . CAROTID  ENDARTERECTOMY    . CERVICAL LAMINECTOMY    . CORONARY ANGIOPLASTY    . FRACTURE SURGERY    . fusion C5-6-7    . HERNIA REPAIR    . KNEE ARTHROSCOPY    . TONSILLECTOMY      There were no vitals filed for this visit.  Subjective Assessment - 01/15/19 1310    Subjective  Patient reports doing well. Denies any pain. Reports adherence with HEP. He reports minimal fatigue this session and states that he has been working on leg exercises at home.    Patient is accompained by:  Family member    Pertinent History  Patient is a 73 y.o. male who presents to outpatient physical therapy with a referral for medical diagnosis of CVA. This patient's chief complaints consist of left hemiplegia and overall deconditioning leading to the following functional deficits: dependent for ADLs, IADLs, unable to transfer to car, dependent transfers at home with hoyer lift, unable to walk or stand without significant assistance, difficulty with W/C navigation..    Limitations  Sitting;Lifting;Standing;House hold activities;Writing;Walking;Other (comment)    Diagnostic tests  Brain MRI 11/01/2017: IMPRESSION: 1. Acute right ACA territory infarct as demonstrated on prior CT imaging. No intracranial hemorrhage or significant mass effect.  2. 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    Currently in Pain?  No/denies    Multiple Pain Sites  No             TREATMENT: Patient transferred stand pivot wheelchair<>nustep with BUE and therapist assistance, min A +1; Patient does require increased time for proper positioning and set up but was able to exhibit good safety with transfer; He requires cues for walking LLE foot around and required min A for placing LLE foot onto foot plate;  Following therapeutic activity: Instructed patient in Nustep, seat #15, arms #14 Pt instructed in BUE/BLE level3x3mnwith cues to increase speed to around 50-55 steps per minute to challenge  cardiovascular endurance with cues to avoid stopping and to continue exercise. Pt less fatigued this session being able to complete exercise without stopping and without lateral lean. Distance propelled: 0.28 miles, HR after exercise 84 bpm; Minimal shortness of breath noted;   Following therapeutic exercise: Transferred nustep to wheelchair squat pivotwith modA +1 for safety;Patient required cues for hand placement and positioning including to increase forward weight shift. He had increased difficulty pivoting to wheelchair likely due to fatigue;  Patient is improving in LE AROM being able to place LLE foot on leg rest with cues and increased time;   Patient sitting in power chair with power chair elevated and tilted forward for better positioning:(seat height 27 inches from floor) Sit<>stand transfersinside parallel bars:  x6 reps; He was able to transfer with close supervision. He did not require therapist assist to block LLE knee and was also able to exhibit forward weight shift well without assistance.  Initially patient able to stand for 40 sec before sitting down with min VCs for weight shift to neutral position and increase hip/knee extension in stance; Following 2 repetitions, instructed patient in pivot turn in parallel bars with cues for pivoting LLE and then RLE. Patient able to turn 45 degrees but unable to make a full 90 degree turn due to fatigue and fear of falling. Requires min A for safety and balance in standing; On 6th transfers, instructed patient in standing marches. He required min A for balance but was able to lift each foot well without loss of balance.   With increased repetition patient expressed increased fatigue in BUE. He does exhibit better safety awareness with controlled sit down. Patient able to scoot back in chair well with therapist cueing him for weight shift forward and push through feet/UE.   He required minA for repositioning in chair at end of  session. Reinforced HEP;                     PT Education - 01/15/19 1311    Education Details  LE strengthening, transfers, HEP    Person(s) Educated  Patient    Methods  Explanation;Verbal cues    Comprehension  Verbalized understanding;Returned demonstration;Verbal cues required;Need further instruction       PT Short Term Goals - 01/13/19 1721      PT SHORT TERM GOAL #1   Title  Be independent with initial home exercise program for self-management of symptoms.    Baseline  HEP to be given at second session; advice to practice unsupported sitting at home (06/19/2018); 07/08/18: Performing at home, 6/29: needs assistance but is doing exercise program regularly; 08/22/18: adherent;    Time  2    Period  Weeks    Status  Achieved    Target Date  06/26/18        PT Long Term Goals - 01/13/19 1721      PT LONG TERM GOAL #1   Title  Patient will complete all bed mobility with min A to improve functional independence for getting in and out of bed and adjusting in bed.     Baseline  max A - total A reported by family (06/19/2018); 07/08/18: still requires maxA, dtr reports improvement in rolling since starting therapy; 6/23: min A to supervision in clinic; not doing at home right; 8/5: Pt's daughter states that his rolling has improved, but still has difficulty with all other bed mobility; 10/23/18: pts daughter states that she is doing about 35% of the work if he is in proper position, he does uses the bed rails to help roll;  using Hoyer for coming to sit EOB; 10/12: pts daughter states that she is doing about 30% of the work if he is in proper position, he does uses the bed rails to help roll, but it is improving;  using Hoyer for coming to sit EOB, 10/29: min A for sit to sidelying, rolling supervision, able to transition sidelying to sit with HHA;    Time  12    Period  Weeks    Status  Achieved      PT LONG TERM GOAL #2   Title  Patient will complete sit <> stand  transfer chair to chair with Min A and LRAD to improve functional independence for household and community mobility.     Baseline  max A +2 STS at W/C (06/12/2018); max A +2-3 STS or stand pivot transfer, able to complete more reps (06/19/2018); 07/08/18: not safe to attempt at this time; 09/11/18: requires maxA+1 or modA+2 for STSs and squat pivot transfer; 10/23/18: requires maxA+1 or modA+2 for STSs and squat pivot transfer; 10/12: requires maxA+1 or modA+2 for STSs and squat pivot transfer; 10/29: patient able to complete sit<>Stand transfer min A +1 with parallel bars and RW, although inconsistent, mod A for stand pivot; 01/13/19: Pt able to perform sit to stand with parallel bars and RW with minA+1 and a few times with CGA only, still requires modA+1 for stand pivot transfer.    Time  12    Period  Weeks    Status  Partially Met    Target Date  02/27/19      PT LONG TERM GOAL #3   Title  Patient will navigate power w/c with min A x 100 feet to improve mobility for household and short community distances.    Baseline  required total A (06/12/2018); able to roll 20 feet with min A (06/20/2018); 07/08/18: Pt can go approximately 10' without assist per family;  10/23/2018: pt's daughter states that his power WC mobility has become 100% better with the new hand control, slowed speed, and with practice; 10/12: pt's daughter reports that he can perform household WC mobility and limited community ambulation with minA    Time  12    Period  Weeks    Status  Achieved      PT LONG TERM GOAL #4   Title  Patient will complete car transfers W/C to family SUV with min A using LRAD to improve his abilty to participate in community activities.     Baseline  unable to complete car transfers at all (06/12/2018); unable to complete car transfers at all (06/19/2018).; 07/08/18: Unable to perform at this time; 09/11/18: unable to perform at this time; 10/23/2018:  unable to complete car transfers at all; 10/12:  unable to complete car  transfers at all, 10/29: unable; 01/13/19: unable;    Time  12    Period  Weeks    Status  On-going    Target Date  02/27/19      PT LONG TERM GOAL #5   Title  Patient will ambulate at least 150 feet with mod I using LRAD to improve mobility for household and community distances.     Baseline  unable to take steps (06/12/2018); able to shift R leg forward and back with max A and RW at edge of plinth (06/19/2018); 07/08/18: unable to ambulate at this time. 09/11/18: unable to perform at this time; 10/23/2018: unable to perform at this time; 10/12: unable to perform at this time, 10/29: unable at this time; 01/13/19: unable at this time    Time  12    Period  Weeks    Status  On-going    Target Date  02/27/19      PT LONG TERM GOAL #6   Title  Patient will be able to safety navigate up/down ramp with power chair, exhibiting good safety awareness, mod I to safely enter/exit his home.    Baseline  unable to complet stairs (06/12/2018); unable to complete stairs (06/19/2018); 07/08/18: unable to attempt at this time; 09/11/18: unsafe to attempt at this time; 10/23/2018: unable to complet stairs (06/12/2018); unable to complete stairs (06/19/2018); 07/08/18: unable to attempt at this time; 09/11/18: unsafe to attempt at this time; 10/23/18: unsafe to attempt at this time, 10/29: requires superivison and occasional min A for safety; 01/13/19: unchanged from last update    Time  12    Period  Weeks    Status  On-going    Target Date  02/27/19      PT LONG TERM GOAL #7   Title  Patient will increase functional reach test to >15 inches in sitting to exhibit improved sitting balance and positioning and reduce fall risk;    Baseline  07/30/18: 12 inches; 09/11/18: 10-12 inches; 10/23/18: 12-13 inch; 10/12: 14.5", 10/29: 15.5 inch    Time  4    Period  Weeks    Status  Achieved      PT LONG TERM GOAL #8   Title  Patient will increase LLE quad strength to 3+/5 to improve functional strength for standing and mobility;    Baseline   10/28: 2+/5; 01/13/19: 2+/5    Time  12    Period  Weeks    Status  On-going    Target Date  02/27/19            Plan - 01/15/19 1351    Clinical Impression Statement  Patient motivated and participated well within session. He is progressing well with Nustep activity with less fatigue and better trunk control and positioning. He was able to maintain a moderate speed with less shortness of breath and less fatigue and was able to pedal longer without stopping. Patient instructed in sit<>stand transfers. He is able to transfer in parallel bars with close supervision with good trunk management and better hip/knee extension in stance. Patient is unable to fully extend LLE due to quad weakness. Patient instructed in pivot turns in parallel bars requiring min A and cues for weight shift/foot placement. Patient unable to fully turn due to weakness and fear of falling. However he was able to lift each foot in a march with min A for LLE quad control  during RLE hip flexion. Patient would benefit from additional skilled PT Intervention to improve strength, balance and mobility;    Personal Factors and Comorbidities  Age;Comorbidity 3+    Comorbidities  Relevant past medical history and comorbidities include long term steroid use for lupus, CKD, chronic osteomyelitis, cervical spine stenosis, BPH, APS, alcohol abuse, peripheral artery disease, depression, GERD, obstructive sleep apnea, HTN, IBS, IgA deficiency, lumbar radiculopathy, RA, Stroke, toxic maculopathy in both eyes, systemic lupus erythematosus related syndrome, cardiac catheterization, carotid endarterectomy, cervical laminectomy, coronary angioplasty, Fusion C5-C7, knee arthroplasty, lumbar surgery.    Examination-Activity Limitations  Bed Mobility;Bend;Sit;Toileting;Stand;Stairs;Lift;Transfers;Squat;Locomotion Level;Carry;Dressing;Hygiene/Grooming;Continence    Examination-Participation Restrictions  Yard Work;Interpersonal Relationship;Community  Activity    Stability/Clinical Decision Making  Evolving/Moderate complexity    Rehab Potential  Fair    PT Frequency  3x / week    PT Duration  12 weeks    PT Treatment/Interventions  ADLs/Self Care Home Management;Electrical Stimulation;Moist Heat;Cryotherapy;Gait training;Stair training;Functional mobility training;Therapeutic exercise;Balance training;Neuromuscular re-education;Cognitive remediation;Patient/family education;Orthotic Fit/Training;Wheelchair mobility training;Manual techniques;Passive range of motion;Energy conservation;Joint Manipulations    PT Next Visit Plan  Work on strength, sitting/standing balance, functional activities such as rolling, bed mobility, transfers; caregiver training    PT Home Exercise Plan  handout provided via Beth during previous sessions    Consulted and Agree with Plan of Care  Patient;Family member/caregiver    Family Member Consulted  daughter [name]       Patient will benefit from skilled therapeutic intervention in order to improve the following deficits and impairments:  Abnormal gait, Decreased activity tolerance, Decreased cognition, Decreased endurance, Decreased knowledge of use of DME, Decreased range of motion, Decreased skin integrity, Decreased strength, Impaired perceived functional ability, Impaired sensation, Impaired UE functional use, Improper body mechanics, Pain, Cardiopulmonary status limiting activity, Decreased balance, Decreased coordination, Decreased mobility, Difficulty walking, Impaired tone, Postural dysfunction  Visit Diagnosis: Muscle weakness (generalized)  Other lack of coordination  Difficulty in walking, not elsewhere classified     Problem List Patient Active Problem List   Diagnosis Date Noted  . Sepsis ([place]) 10/18/2018    Trotter,Margaret PT, DPT 01/15/2019, 1:57 PM  [place] [facility] MAIN [facility] SERVICES [address] [place], [place], [address] Phone: [phone]   Fax:   [phone]  Name: AMITAI DELAUGHTER MRN: [id number] Date of Birth: 22-Jan-1946

## 2019-01-16 ENCOUNTER — Ambulatory Visit: Payer: Medicare Other | Admitting: Occupational Therapy

## 2019-01-16 ENCOUNTER — Ambulatory Visit: Payer: Medicare Other | Admitting: Physical Therapy

## 2019-01-20 ENCOUNTER — Other Ambulatory Visit: Payer: Self-pay

## 2019-01-20 ENCOUNTER — Ambulatory Visit: Payer: Medicare Other | Admitting: Occupational Therapy

## 2019-01-20 ENCOUNTER — Ambulatory Visit: Payer: Medicare Other

## 2019-01-20 ENCOUNTER — Encounter: Payer: Self-pay | Admitting: Physical Therapy

## 2019-01-20 ENCOUNTER — Encounter: Payer: Self-pay | Admitting: Occupational Therapy

## 2019-01-20 DIAGNOSIS — R262 Difficulty in walking, not elsewhere classified: Secondary | ICD-10-CM

## 2019-01-20 DIAGNOSIS — R278 Other lack of coordination: Secondary | ICD-10-CM

## 2019-01-20 DIAGNOSIS — R296 Repeated falls: Secondary | ICD-10-CM

## 2019-01-20 DIAGNOSIS — M6281 Muscle weakness (generalized): Secondary | ICD-10-CM | POA: Diagnosis not present

## 2019-01-20 NOTE — Therapy (Signed)
[place] MAIN [facility] SERVICES [address] [place], [place], [address] Phone: [phone]   Fax:  [phone] Physical Therapy Treatment  Patient Details  Name: Kenneth SWINGERMRN: [id number] of Birth: 1March 12, 1947Referring Provider (PT): Dr. OKym Groom MGuy Begin  Encounter Date: 01/20/2019  PT End of Session - 01/20/19 1352    Visit Number  [id number]   Number of Visits  108    Date for PT Re-Evaluation  02/27/19    PT Start Time  1300    PT Stop Time  1345    PT Time Calculation (min)  45 min    Equipment Utilized During Treatment  Gait belt    Activity Tolerance  Patient tolerated treatment well    Behavior During Therapy  [facility] tasks assessed/performed       Past Medical History:  Diagnosis Date  . Alcohol abuse   . APS (antiphospholipid syndrome) ([place]   . BPH (benign prostatic hyperplasia)   . CAD (coronary artery disease)   . Cellulitis   . Cervical spinal stenosis   . Chronic osteomyelitis ([place]   . CKD (chronic kidney disease)   . CKD (chronic kidney disease)   . Clostridium difficile diarrhea   . Depression   . Diplopia   . Fatigue   . GERD (gastroesophageal reflux disease)   . Hyperlipemia   . Hypertension   . Hypovitaminosis D   . IBS (irritable bowel syndrome)   . IgA deficiency ([place]   . Insomnia   . Left lumbosacral radiculopathy   . Moderate obstructive sleep apnea   . Osteomyelitis of foot ([place]   . PAD (peripheral artery disease) ([place]   . Pruritus   . RA (rheumatoid arthritis) ([place]   . Radiculopathy   . Restless legs syndrome   . RLS (restless legs syndrome)   . SLE (systemic lupus erythematosus related syndrome) ([place]   . Stroke ([place]   . Toxic maculopathy of both eyes     Past Surgical History:  Procedure Laterality Date  . CARDIAC CATHETERIZATION    . CAROTID ENDARTERECTOMY    . CERVICAL LAMINECTOMY    . CORONARY ANGIOPLASTY    . FRACTURE SURGERY    . fusion C5-6-7    . HERNIA  REPAIR    . KNEE ARTHROSCOPY    . TONSILLECTOMY      There were no vitals filed for this visit.  Subjective Assessment - 01/20/19 1339    Subjective  Pt reports he is feeling like he has good energy for therapy this afternoon.  He denies pain upon arrival.  He had a blister on his L knee/thigh from bumping it but he does not recall how.  Daughter states it is covered with a dressing now.    Patient is accompained by:  Family member    Currently in Pain?  No/denies       Today's treatment:   Sit to stand: (facing parallel bar outside) -first time: CGA x 1, standing time- 4 minutes with PT CGA, wt shifts laterally, VC's for upright posture, pt reach for bar with L hand and push from chair with R hand -second time: Min A x 1, standing time - 2 minutes with PT CGA, wt shifts laterally, VC's for upright posture -third time: Mod A x 1, standing time- 45 sec VC's to sit upright and for leaning forward in chair after rest breaks prior to transfer  Transfers: -Squat pivot (power chair to  at table) with min A x 1 -sit to sidelying to supine hooklying with min A x 1  Exercises: -hooklying alternating marches: x 10 -hooklying abd bilateral: 5 sec x 10 -hooklying add bilateral: 5 sec x 10 -supine hip abd: x10 L -supine hip add: x10 L -SAQ: x10 L  Transfers Rolling supine to R x 3 -sidelying to sit -sit to stand pivot transfer table to chair with min A x 1  -chair tilt for assisting with repositioning in chair with min A x 2 at end of session       PT Education - 01/20/19 1350    Education Details  body mechanics for transfers- forward trunk lean and scooting hips forward before standing    Person(s) Educated  Patient    Methods  Explanation;Verbal cues    Comprehension  Verbalized understanding;Returned demonstration;Verbal cues required       PT Short Term Goals - 01/13/19 1721      PT SHORT TERM GOAL #1   Title  Be independent with initial home exercise program for  self-management of symptoms.    Baseline  HEP to be given at second session; advice to practice unsupported sitting at home (06/19/2018); 07/08/18: Performing at home, 6/29: needs assistance but is doing exercise program regularly; 08/22/18: adherent;    Time  2    Period  Weeks    Status  Achieved    Target Date  06/26/18        PT Long Term Goals - 01/13/19 1721      PT LONG TERM GOAL #1   Title  Patient will complete all bed mobility with min A to improve functional independence for getting in and out of bed and adjusting in bed.     Baseline  max A - total A reported by family (06/19/2018); 07/08/18: still requires maxA, dtr reports improvement in rolling since starting therapy; 6/23: min A to supervision in clinic; not doing at home right; 8/5: Pt's daughter states that his rolling has improved, but still has difficulty with all other bed mobility; 10/23/18: pts daughter states that she is doing about 35% of the work if he is in proper position, he does uses the bed rails to help roll;  using [place] for coming to sit EOB; 10/12: pts daughter states that she is doing about 30% of the work if he is in proper position, he does uses the bed rails to help roll, but it is improving;  using Hoyer for coming to sit EOB, 10/29: min A for sit to sidelying, rolling supervision, able to transition sidelying to sit with HHA;    Time  12    Period  Weeks    Status  Achieved      PT LONG TERM GOAL #2   Title  Patient will complete sit <> stand transfer chair to chair with Min A and LRAD to improve functional independence for household and community mobility.     Baseline  max A +2 STS at W/C (06/12/2018); max A +2-3 STS or stand pivot transfer, able to complete more reps (06/19/2018); 07/08/18: not safe to attempt at this time; 09/11/18: requires maxA+1 or modA+2 for STSs and squat pivot transfer; 10/23/18: requires maxA+1 or modA+2 for STSs and squat pivot transfer; 10/12: requires maxA+1 or modA+2 for STSs and squat  pivot transfer; 10/29: patient able to complete sit<>Stand transfer min A +1 with parallel bars and RW, although inconsistent, mod A for stand pivot; 01/13/19: Pt able to  perform sit to stand with parallel bars and RW with minA+1 and a few times with CGA only, still requires modA+1 for stand pivot transfer.    Time  12    Period  Weeks    Status  Partially Met    Target Date  02/27/19      PT LONG TERM GOAL #3   Title  Patient will navigate power w/c with min A x 100 feet to improve mobility for household and short community distances.    Baseline  required total A (06/12/2018); able to roll 20 feet with min A (06/20/2018); 07/08/18: Pt can go approximately 10' without assist per family;  10/23/2018: pt's daughter states that his power WC mobility has become 100% better with the new hand control, slowed speed, and with practice; 10/12: pt's daughter reports that he can perform household WC mobility and limited community ambulation with minA    Time  12    Period  Weeks    Status  Achieved      PT LONG TERM GOAL #4   Title  Patient will complete car transfers W/C to family SUV with min A using LRAD to improve his abilty to participate in community activities.     Baseline  unable to complete car transfers at all (06/12/2018); unable to complete car transfers at all (06/19/2018).; 07/08/18: Unable to perform at this time; 09/11/18: unable to perform at this time; 10/23/2018: unable to complete car transfers at all; 10/12:  unable to complete car transfers at all, 10/29: unable; 01/13/19: unable;    Time  12    Period  Weeks    Status  On-going    Target Date  02/27/19      PT LONG TERM GOAL #5   Title  Patient will ambulate at least 150 feet with mod I using LRAD to improve mobility for household and community distances.     Baseline  unable to take steps (06/12/2018); able to shift R leg forward and back with max A and RW at edge of plinth (06/19/2018); 07/08/18: unable to ambulate at this time. 09/11/18: unable to  perform at this time; 10/23/2018: unable to perform at this time; 10/12: unable to perform at this time, 10/29: unable at this time; 01/13/19: unable at this time    Time  12    Period  Weeks    Status  On-going    Target Date  02/27/19      PT LONG TERM GOAL #6   Title  Patient will be able to safety navigate up/down ramp with power chair, exhibiting good safety awareness, mod I to safely enter/exit his home.    Baseline  unable to complet stairs (06/12/2018); unable to complete stairs (06/19/2018); 07/08/18: unable to attempt at this time; 09/11/18: unsafe to attempt at this time; 10/23/2018: unable to complet stairs (06/12/2018); unable to complete stairs (06/19/2018); 07/08/18: unable to attempt at this time; 09/11/18: unsafe to attempt at this time; 10/23/18: unsafe to attempt at this time, 10/29: requires superivison and occasional min A for safety; 01/13/19: unchanged from last update    Time  12    Period  Weeks    Status  On-going    Target Date  02/27/19      PT LONG TERM GOAL #7   Title  Patient will increase functional reach test to >15 inches in sitting to exhibit improved sitting balance and positioning and reduce fall risk;    Baseline  07/30/18: 12 inches; 09/11/18:  10-12 inches; 10/23/18: 12-13 inch; 10/12: 14.5", 10/29: 15.5 inch    Time  4    Period  Weeks    Status  Achieved      PT LONG TERM GOAL #8   Title  Patient will increase LLE quad strength to 3+/5 to improve functional strength for standing and mobility;    Baseline  10/28: 2+/5; 01/13/19: 2+/5    Time  12    Period  Weeks    Status  On-going    Target Date  02/27/19            Plan - 01/20/19 1356    Clinical Impression Statement  Pt motivated and participated well within session.  He was able to stand for 4 minutes with first sit to stand transfer which pt and his daughter state is the longest amount of time he has been able to stand.  Pt does require VC's for body mechanics- scooting hips forward in chair and leaning  trunk forward prior to sit to stand.  Pt would benefit from continued skilled PT intervention to improve strength, balance, mobility, and transfer ability.    Personal Factors and Comorbidities  Age;Comorbidity 3+    Comorbidities  Relevant past medical history and comorbidities include long term steroid use for lupus, CKD, chronic osteomyelitis, cervical spine stenosis, BPH, APS, alcohol abuse, peripheral artery disease, depression, GERD, obstructive sleep apnea, HTN, IBS, IgA deficiency, lumbar radiculopathy, RA, Stroke, toxic maculopathy in both eyes, systemic lupus erythematosus related syndrome, cardiac catheterization, carotid endarterectomy, cervical laminectomy, coronary angioplasty, Fusion C5-C7, knee arthroplasty, lumbar surgery.    Examination-Activity Limitations  Bed Mobility;Bend;Sit;Toileting;Stand;Stairs;Lift;Transfers;Squat;Locomotion Level;Carry;Dressing;Hygiene/Grooming;Continence    Examination-Participation Restrictions  Yard Work;Interpersonal Relationship;Community Activity    Stability/Clinical Decision Making  Evolving/Moderate complexity    Rehab Potential  Fair    PT Frequency  3x / week    PT Duration  12 weeks    PT Treatment/Interventions  ADLs/Self Care Home Management;Electrical Stimulation;Moist Heat;Cryotherapy;Gait training;Stair training;Functional mobility training;Therapeutic exercise;Balance training;Neuromuscular re-education;Cognitive remediation;Patient/family education;Orthotic Fit/Training;Wheelchair mobility training;Manual techniques;Passive range of motion;Energy conservation;Joint Manipulations    PT Next Visit Plan  Work on strength, sitting/standing balance, functional activities such as rolling, bed mobility, transfers; caregiver training    PT Home Exercise Plan  handout provided via Beth during previous sessions    Consulted and Agree with Plan of Care  Patient;Family member/caregiver    Family Member Consulted  daughter [name]       Patient will  benefit from skilled therapeutic intervention in order to improve the following deficits and impairments:  Abnormal gait, Decreased activity tolerance, Decreased cognition, Decreased endurance, Decreased knowledge of use of DME, Decreased range of motion, Decreased skin integrity, Decreased strength, Impaired perceived functional ability, Impaired sensation, Impaired UE functional use, Improper body mechanics, Pain, Cardiopulmonary status limiting activity, Decreased balance, Decreased coordination, Decreased mobility, Difficulty walking, Impaired tone, Postural dysfunction  Visit Diagnosis: Difficulty in walking, not elsewhere classified  Repeated falls  Other lack of coordination     Problem List Patient Active Problem List   Diagnosis Date Noted  . Sepsis ([place]) 10/18/2018    Pincus Badder 01/20/2019, 2:06 PM  Merdis Delay, PT, DPT Physical Therapist - [facility]  Outpatient Physical Therapy- [place] MAIN [facility] SERVICES [address] [place], [place], [address] Phone: [phone]   Fax:  [phone]  Name: Kenneth Summers MRN: [id number] Date of Birth: Aug 22, 1945

## 2019-01-20 NOTE — Therapy (Signed)
[place] MAIN [facility] SERVICES [address] [place], [place], [address] Phone: [phone]   Fax:  [phone]  Occupational Therapy Treatment  Patient Details  Name: Kenneth Summers MRN: [id number] Date of Birth: [birth year] No data recorded  Encounter Date: 01/20/2019  OT End of Session - 01/20/19 1602    Visit Number  46    Number of Visits  45    Date for OT Re-Evaluation  04/07/19    Authorization Type  Progress report period starting 12/26/2018    OT Start Time  1345    OT Stop Time  1430    OT Time Calculation (min)  45 min    Activity Tolerance  Patient tolerated treatment well;Patient limited by fatigue    Behavior During Therapy  [facility] for tasks assessed/performed       Past Medical History:  Diagnosis Date  . Alcohol abuse   . APS (antiphospholipid syndrome) ([place])   . BPH (benign prostatic hyperplasia)   . CAD (coronary artery disease)   . Cellulitis   . Cervical spinal stenosis   . Chronic osteomyelitis ([place])   . CKD (chronic kidney disease)   . CKD (chronic kidney disease)   . Clostridium difficile diarrhea   . Depression   . Diplopia   . Fatigue   . GERD (gastroesophageal reflux disease)   . Hyperlipemia   . Hypertension   . Hypovitaminosis D   . IBS (irritable bowel syndrome)   . IgA deficiency ([place])   . Insomnia   . Left lumbosacral radiculopathy   . Moderate obstructive sleep apnea   . Osteomyelitis of foot ([place])   . PAD (peripheral artery disease) ([place])   . Pruritus   . RA (rheumatoid arthritis) ([place])   . Radiculopathy   . Restless legs syndrome   . RLS (restless legs syndrome)   . SLE (systemic lupus erythematosus related syndrome) ([place])   . Stroke ([place])   . Toxic maculopathy of both eyes     Past Surgical History:  Procedure Laterality Date  . CARDIAC CATHETERIZATION    . CAROTID ENDARTERECTOMY    . CERVICAL LAMINECTOMY    . CORONARY ANGIOPLASTY    . FRACTURE SURGERY    . fusion C5-6-7    .  HERNIA REPAIR    . KNEE ARTHROSCOPY    . TONSILLECTOMY      There were no vitals filed for this visit.  Subjective Assessment - 01/20/19 1601    Subjective   Pt. reports feeling good today.    Patient is accompanied by:  Family member    Pertinent History  Pt. is a 73 y.o. 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 Pt. PMHx includes: Multiple Falls, Lupus, DJD, Renal Abscess, CVA. Pt. resides with his wife. Pt.'s wife and daughter assist with ADLs. Pt. has caregivers in  for 2 hours a day, 6 days a week. Pt. received Rehab services in acute care, at SNF for STR, and [place] services. Pt. is retired from [employer] for [employer] and [employer].    Patient Stated Goals  To regain use of his left UE, and do more for himself.    Currently in Pain?  No/denies      OT TREATMENT    Neuro muscular re-education:  Pt. Worked on left hand [facility] skills manipulating nuts, and bolts on a bolt board. Pt. Worked on screwing, and unscrewing nuts, and bolts of varying sizes, and  challenging progressively smaller items with vision occluded.  Therapeutic Exercise:  Pt. performed 2# dowel ex. For UE strengthening secondary to weakness. Bilateral shoulder flexion, chest press, circular patterns, and "V" shaped pattern.  Response to Treatment:  Pt. continues to make steady progress overall with LUE functional reaching. Pt. requires cues for trunk control, and exercise movement patterns. Pt. had difficulty using his left hand to manipulate, and fasten the nuts onto the bolts with vision occluded. Pt. Required verbal cues, and rest breaks, however no reclining restbreak required.                       OT Education - 01/20/19 1602    Education Details  UE functioning    Person(s) Educated  Patient    Methods  Explanation;Demonstration    Comprehension  Returned demonstration;Tactile cues required;Verbalized understanding;Need further instruction           OT Long Term Goals - 01/13/19 1325      OT LONG TERM GOAL #1   Title  Pt. will increase left shoulder flexion AROM by 10 degrees to access cabinet/shelf.    Baseline  Pt. is improving with LUE ROM, and functional reaching.    Time  12    Period  Weeks    Status  On-going    Target Date  04/07/19      OT LONG TERM GOAL #2   Title  Pt. will donn a shirt with Supervision.    Baseline  MinA donning a zip down jacket    Time  12    Period  Weeks    Status  On-going    Target Date  04/07/19      OT LONG TERM GOAL #3   Title  Pt. will require ModA to perform LE dressing    Baseline  MaxA left, ModA with the right. Pt. has sores on his ankle, and feet. Pt.'s daughter is assisting.    Time  12    Period  Weeks    Status  Deferred    Target Date  01/15/20      OT LONG TERM GOAL #4   Title  Pt. will improve left grip strength by 10# to be able to open a jar/container.    Baseline  pt. is making excellent progress with left grip strength, continues to work onopening jars, and containers.    Time  12    Period  Weeks    Status  On-going    Target Date  04/07/19      OT LONG TERM GOAL #5   Title  Pt. will improve left hand [facility] skills to be able to assist with buttoning/zipping.    Baseline  Pt. has made excellent progress with bilateral [facility] skills. Pt. continues to work on improving bilateral [facility] skills for buttoning/zipping    Time  12    Period  Weeks    Status  On-going    Target Date  04/07/19      Long Term Additional Goals   Additional Long Term Goals  Yes      OT LONG TERM GOAL #6   Title  Pt. will demonstrate visual conmpensatory strategies during 100% of the time during ADLs    Baseline  Pt. requires cues at times for left sides awareness.    Time  12    Period  Weeks    Status  On-going    Target Date  04/07/19  OT LONG TERM GOAL #7   Title  Pt. will prepare a simple cold snack from w/c with supervision using cognitive compensatory strategies 100% of  the time.    Baseline  Pt. has difficulty with getting the w/c close enough to access the refrigerator    Time  12    Period  Weeks    Status  Deferred    Target Date  04/07/19      OT LONG TERM GOAL #8   Title  Pt. will accurately identify potential safety hazard using good safety awareness, and judgement 100% for ADLs, and IADLs.    Baseline  Pt. continues to be limited    Time  12    Period  Weeks    Status  On-going    Target Date  04/07/19      OT LONG TERM GOAL  #9   TITLE  Pt. will  navigate his w/c around obstacles with Supervision and 100% accuracy    Baseline  Pt. continues to require consistent cuing for obstacle on the left    Time  12    Period  Weeks    Status  On-going    Target Date  04/07/19      OT LONG TERM GOAL  #11   TITLE  Pt. will increase BUE strength by 52mm grades in preparation for ADL transfers.    Baseline  Limited BUE strength    Time  12    Period  Weeks    Status  New    Target Date  04/07/19            Plan - 01/20/19 1603    Clinical Impression Statement Pt. continues to make steady progress overall with LUE functional reaching. Pt. requires cues for trunk control, and exercise movement patterns. Pt. had difficulty using his left hand to manipulate, and fasten the nuts onto the bolts with vision occluded. Pt. Required verbal cues, and rest breaks, however no reclining restbreak required.   OT Occupational Profile and History  Problem Focused Assessment - Including review of records relating to presenting problem    Occupational performance deficits (Please refer to evaluation for details):  ADL's    Body Structure / Function / Physical Skills  UE functional use;Coordination;FMC;Dexterity;Strength;ROM    Cognitive Skills  Attention;Memory;Emotional;Problem Solve;Safety Awareness    Rehab Potential  Good    Clinical Decision Making  Several treatment options, min-mod task modification necessary    Comorbidities Affecting Occupational  Performance:  Presence of comorbidities impacting occupational performance    Comorbidities impacting occupational performance description:  Phyical, cognitive, visual,  medical comorbidities    Modification or Assistance to Complete Evaluation   No modification of tasks or assist necessary to complete eval    OT Frequency  2x / week    OT Duration  12 weeks    OT Treatment/Interventions  Self-care/ADL training;DME and/or AE instruction;Therapeutic exercise;Therapeutic activities;Moist Heat;Cognitive remediation/compensation;Neuromuscular education;Visual/perceptual remediation/compensation;Coping strategies training;Patient/family education;Passive range of motion;Psychosocial skills training;Energy conservation;Functional Mobility Training    Consulted and Agree with Plan of Care  Patient    Family Member Consulted  Daughter [name]       Patient will benefit from skilled therapeutic intervention in order to improve the following deficits and impairments:   Body Structure / Function / Physical Skills: UE functional use, Coordination, FMC, Dexterity, Strength, ROM Cognitive Skills: Attention, Memory, Emotional, Problem Solve, Safety Awareness     Visit Diagnosis: Muscle weakness (generalized)  Other lack of coordination  Problem List Patient Active Problem List   Diagnosis Date Noted  . Sepsis ([place]) 10/18/2018    Harrel Carina, MS, OTR/L 01/20/2019, 5:37 PM  [place] MAIN [facility] SERVICES [address] [place], [place], [address] Phone: [phone]   Fax:  [phone]  Name: Kenneth Summers MRN: [id number] Date of Birth: December 25, 1945

## 2019-01-22 ENCOUNTER — Ambulatory Visit: Payer: Medicare Other | Admitting: Physical Therapy

## 2019-01-22 ENCOUNTER — Ambulatory Visit: Payer: Medicare Other | Admitting: Occupational Therapy

## 2019-01-23 ENCOUNTER — Encounter: Payer: Self-pay | Admitting: Occupational Therapy

## 2019-01-23 ENCOUNTER — Encounter: Payer: Self-pay | Admitting: Physical Therapy

## 2019-01-23 ENCOUNTER — Other Ambulatory Visit: Payer: Self-pay

## 2019-01-23 ENCOUNTER — Ambulatory Visit: Payer: Medicare Other | Admitting: Occupational Therapy

## 2019-01-23 ENCOUNTER — Ambulatory Visit: Payer: Medicare Other | Admitting: Physical Therapy

## 2019-01-23 DIAGNOSIS — M6281 Muscle weakness (generalized): Secondary | ICD-10-CM

## 2019-01-23 DIAGNOSIS — R296 Repeated falls: Secondary | ICD-10-CM

## 2019-01-23 DIAGNOSIS — R262 Difficulty in walking, not elsewhere classified: Secondary | ICD-10-CM

## 2019-01-23 DIAGNOSIS — R278 Other lack of coordination: Secondary | ICD-10-CM

## 2019-01-23 NOTE — Therapy (Signed)
[place] MAIN [facility] SERVICES [address] [place], [place], [address] Phone: [phone]   Fax:  [phone]  Physical Therapy Treatment  Patient Details  Name: Kenneth Summers MRN: [id number] Date of Birth: 04/10/1945 Referring Provider (PT): Dr. [name], Guy Begin   Encounter Date: 01/23/2019  PT End of Session - 01/23/19 1322    Visit Number  [id number]    Number of Visits  108    Date for PT Re-Evaluation  02/27/19    Authorization Type  Medicare reporting period starting 09/11/18    Authorization Time Period  goals last assessed 12/04/18    PT Start Time  1302    PT Stop Time  1345    PT Time Calculation (min)  43 min    Equipment Utilized During Treatment  Gait belt    Activity Tolerance  Patient tolerated treatment well    Behavior During Therapy  [facility] for tasks assessed/performed       Past Medical History:  Diagnosis Date  . Alcohol abuse   . APS (antiphospholipid syndrome) ([place])   . BPH (benign prostatic hyperplasia)   . CAD (coronary artery disease)   . Cellulitis   . Cervical spinal stenosis   . Chronic osteomyelitis ([place])   . CKD (chronic kidney disease)   . CKD (chronic kidney disease)   . Clostridium difficile diarrhea   . Depression   . Diplopia   . Fatigue   . GERD (gastroesophageal reflux disease)   . Hyperlipemia   . Hypertension   . Hypovitaminosis D   . IBS (irritable bowel syndrome)   . 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 Insomnia   . Left lumbosacral radiculopathy   . Moderate obstructive sleep apnea   . Osteomyelitis of foot ([place])   . PAD (peripheral artery disease) ([place])   . Pruritus   . RA (rheumatoid arthritis) ([place])   . Radiculopathy   . Restless legs syndrome   . RLS (restless legs syndrome)   . SLE (systemic lupus erythematosus related syndrome) ([place])   . Stroke ([place])   . Toxic maculopathy of both eyes     Past Surgical History:  Procedure Laterality Date  . CARDIAC CATHETERIZATION    . CAROTID  ENDARTERECTOMY    . CERVICAL LAMINECTOMY    . CORONARY ANGIOPLASTY    . FRACTURE SURGERY    . fusion C5-6-7    . HERNIA REPAIR    . KNEE ARTHROSCOPY    . TONSILLECTOMY      There were no vitals filed for this visit.  Subjective Assessment - 01/23/19 1319    Subjective  Patient reports having a rough day yesterday being heavily fatigued. He reports some fatigue today and some dizziness. Reports mild soreness in BLE but otherwise no pain;    Pertinent History  Patient is a 73 y.o. male who presents to outpatient physical therapy with a referral for medical diagnosis of CVA. This patient's chief complaints consist of left hemiplegia and overall deconditioning leading to the following functional deficits: dependent for ADLs, IADLs, unable to transfer to car, dependent transfers at home with hoyer lift, unable to walk or stand without significant assistance, difficulty with W/C navigation..    Limitations  Sitting;Lifting;Standing;House hold activities;Writing;Walking;Other (comment)    Currently in Pain?  No/denies    Multiple Pain Sites  No           TREATMENT: Patient transferred stand pivot wheelchair<>nustep with BUE and therapist assistance, min A +1;Patient does  require increased time for proper positioning and set up but was able to exhibit good safety with transfer; He requires cues for walking LLE foot around and required min A for placing LLE foot onto foot plate;  Following therapeutic activity: Patient seated: Alternate LAQ x10 reps each with mod VCs to increase AROM for better muscle activation and strengthening in each LE. Patient able to achieve partial ROM on LLE due to weakness;  Instructed patient in Nustep, seat #15, arms #14 Pt instructed in BUE/BLE [id number] cues to increase speedto around 50-55 steps per minute to challenge cardiovascular endurance with cues to avoid stopping and to continue exercise.  Distance propelled: 0.12 miles, Patient heavily  fatigued this session requiring cues for trunk control and to increase speed. He was unable to continue >5 min due to fatigue.   Following therapeutic exercise: Transferred nustep to wheelchair squat pivotwith min A +2 for safety;Patient required cues for hand placement and positioning including to increase forward weight shift. He had increased difficulty pivoting to wheelchair likely due to fatigue;  Patient is improving in LE AROM being able to place LLE foot on leg rest with cues and increased time;   Patient sitting in power chair with power chair elevated and tilted forward for better positioning:(seat height 27 inches from floor) Sit<>stand transfersat sink/counter  x4reps; He was able to transfer with min A-close supervision. He did not require therapist assist to block LLE knee but did require assistance for forward weight shift and proper positioning; Patient able to stand for approximately 10 sec each repetition. He often sits quickly due to fatigue and weakness. Instructed patient to reach back for chair to improve safety awareness.   With increased repetition patient expressed increased fatigue in BUE.  He required minA for repositioning in chair at end of session. Reinforced HEP;                     PT Education - 01/23/19 1322    Education Details  transfers, exercise, positioning, HEP    Person(s) Educated  Patient    Methods  Explanation;Verbal cues    Comprehension  Verbalized understanding;Returned demonstration;Verbal cues required;Need further instruction       PT Short Term Goals - 01/13/19 1721      PT SHORT TERM GOAL #1   Title  Be independent with initial home exercise program for self-management of symptoms.    Baseline  HEP to be given at second session; advice to practice unsupported sitting at home (06/19/2018); 07/08/18: Performing at home, 6/29: needs assistance but is doing exercise program regularly; 08/22/18: adherent;     Time  2    Period  Weeks    Status  Achieved    Target Date  06/26/18        PT Long Term Goals - 01/13/19 1721      PT LONG TERM GOAL #1   Title  Patient will complete all bed mobility with min A to improve functional independence for getting in and out of bed and adjusting in bed.     Baseline  max A - total A reported by family (06/19/2018); 07/08/18: still requires maxA, dtr reports improvement in rolling since starting therapy; 6/23: min A to supervision in clinic; not doing at home right; 8/5: Pt's daughter states that his rolling has improved, but still has difficulty with all other bed mobility; 10/23/18: pts daughter states that she is doing about 35% of the work if he is in proper  position, he does uses the bed rails to help roll;  using Harrel Lemon for coming to sit EOB; 10/12: pts daughter states that she is doing about 30% of the work if he is in proper position, he does uses the bed rails to help roll, but it is improving;  using Hoyer for coming to sit EOB, 10/29: min A for sit to sidelying, rolling supervision, able to transition sidelying to sit with HHA;    Time  12    Period  Weeks    Status  Achieved      PT LONG TERM GOAL #2   Title  Patient will complete sit <> stand transfer chair to chair with Min A and LRAD to improve functional independence for household and community mobility.     Baseline  max A +2 STS at W/C (06/12/2018); max A +2-3 STS or stand pivot transfer, able to complete more reps (06/19/2018); 07/08/18: not safe to attempt at this time; 09/11/18: requires maxA+1 or modA+2 for STSs and squat pivot transfer; 10/23/18: requires maxA+1 or modA+2 for STSs and squat pivot transfer; 10/12: requires maxA+1 or modA+2 for STSs and squat pivot transfer; 10/29: patient able to complete sit<>Stand transfer min A +1 with parallel bars and RW, although inconsistent, mod A for stand pivot; 01/13/19: Pt able to perform sit to stand with parallel bars and RW with minA+1 and a few times with CGA  only, still requires modA+1 for stand pivot transfer.    Time  12    Period  Weeks    Status  Partially Met    Target Date  02/27/19      PT LONG TERM GOAL #3   Title  Patient will navigate power w/c with min A x 100 feet to improve mobility for household and short community distances.    Baseline  required total A (06/12/2018); able to roll 20 feet with min A (06/20/2018); 07/08/18: Pt can go approximately 10' without assist per family;  10/23/2018: pt's daughter states that his power WC mobility has become 100% better with the new hand control, slowed speed, and with practice; 10/12: pt's daughter reports that he can perform household WC mobility and limited community ambulation with minA    Time  12    Period  Weeks    Status  Achieved      PT LONG TERM GOAL #4   Title  Patient will complete car transfers W/C to family SUV with min A using LRAD to improve his abilty to participate in community activities.     Baseline  unable to complete car transfers at all (06/12/2018); unable to complete car transfers at all (06/19/2018).; 07/08/18: Unable to perform at this time; 09/11/18: unable to perform at this time; 10/23/2018: unable to complete car transfers at all; 10/12:  unable to complete car transfers at all, 10/29: unable; 01/13/19: unable;    Time  12    Period  Weeks    Status  On-going    Target Date  02/27/19      PT LONG TERM GOAL #5   Title  Patient will ambulate at least 150 feet with mod I using LRAD to improve mobility for household and community distances.     Baseline  unable to take steps (06/12/2018); able to shift R leg forward and back with max A and RW at edge of plinth (06/19/2018); 07/08/18: unable to ambulate at this time. 09/11/18: unable to perform at this time; 10/23/2018: unable to perform at this  time; 10/12: unable to perform at this time, 10/29: unable at this time; 01/13/19: unable at this time    Time  12    Period  Weeks    Status  On-going    Target Date  02/27/19      PT LONG  TERM GOAL #6   Title  Patient will be able to safety navigate up/down ramp with power chair, exhibiting good safety awareness, mod I to safely enter/exit his home.    Baseline  unable to complet stairs (06/12/2018); unable to complete stairs (06/19/2018); 07/08/18: unable to attempt at this time; 09/11/18: unsafe to attempt at this time; 10/23/2018: unable to complet stairs (06/12/2018); unable to complete stairs (06/19/2018); 07/08/18: unable to attempt at this time; 09/11/18: unsafe to attempt at this time; 10/23/18: unsafe to attempt at this time, 10/29: requires superivison and occasional min A for safety; 01/13/19: unchanged from last update    Time  12    Period  Weeks    Status  On-going    Target Date  02/27/19      PT LONG TERM GOAL #7   Title  Patient will increase functional reach test to >15 inches in sitting to exhibit improved sitting balance and positioning and reduce fall risk;    Baseline  07/30/18: 12 inches; 09/11/18: 10-12 inches; 10/23/18: 12-13 inch; 10/12: 14.5", 10/29: 15.5 inch    Time  4    Period  Weeks    Status  Achieved      PT LONG TERM GOAL #8   Title  Patient will increase LLE quad strength to 3+/5 to improve functional strength for standing and mobility;    Baseline  10/28: 2+/5; 01/13/19: 2+/5    Time  12    Period  Weeks    Status  On-going    Target Date  02/27/19            Plan - 01/23/19 1531    Clinical Impression Statement  patient motivated but heavily fatigued this session. He did well with stand pivot transfers, but had a lot of difficulty with sit<>stand transfers. Instructed patient in proper positioning and wheelchair set up for transfers at sink/counter in order to improve transfer ability at home. He did stand with daughter's supervision exhibiting good positioning and safety awareness. he does require mod VCs for proper hand placement and positioning. patient would benefit from additional skilled PT intervention to improve strength, balance and mobility;     Personal Factors and Comorbidities  Age;Comorbidity 3+    Comorbidities  Relevant past medical history and comorbidities include long term steroid use for lupus, CKD, chronic osteomyelitis, cervical spine stenosis, BPH, APS, alcohol abuse, peripheral artery disease, depression, GERD, obstructive sleep apnea, HTN, IBS, IgA deficiency, lumbar radiculopathy, RA, Stroke, toxic maculopathy in both eyes, systemic lupus erythematosus related syndrome, cardiac catheterization, carotid endarterectomy, cervical laminectomy, coronary angioplasty, Fusion C5-C7, knee arthroplasty, lumbar surgery.    Examination-Activity Limitations  Bed Mobility;Bend;Sit;Toileting;Stand;Stairs;Lift;Transfers;Squat;Locomotion Level;Carry;Dressing;Hygiene/Grooming;Continence    Examination-Participation Restrictions  Yard Work;Interpersonal Relationship;Community Activity    Stability/Clinical Decision Making  Evolving/Moderate complexity    Rehab Potential  Fair    PT Frequency  3x / week    PT Duration  12 weeks    PT Treatment/Interventions  ADLs/Self Care Home Management;Electrical Stimulation;Moist Heat;Cryotherapy;Gait training;Stair training;Functional mobility training;Therapeutic exercise;Balance training;Neuromuscular re-education;Cognitive remediation;Patient/family education;Orthotic Fit/Training;Wheelchair mobility training;Manual techniques;Passive range of motion;Energy conservation;Joint Manipulations    PT Next Visit Plan  Work on strength, sitting/standing balance, functional activities such as rolling,  bed mobility, transfers; caregiver training    PT Home Exercise Plan  handout provided via Beth during previous sessions    Consulted and Agree with Plan of Care  Patient;Family member/caregiver    Family Member Consulted  daughter [name]       Patient will benefit from skilled therapeutic intervention in order to improve the following deficits and impairments:  Abnormal gait, Decreased activity tolerance,  Decreased cognition, Decreased endurance, Decreased knowledge of use of DME, Decreased range of motion, Decreased skin integrity, Decreased strength, Impaired perceived functional ability, Impaired sensation, Impaired UE functional use, Improper body mechanics, Pain, Cardiopulmonary status limiting activity, Decreased balance, Decreased coordination, Decreased mobility, Difficulty walking, Impaired tone, Postural dysfunction  Visit Diagnosis: Muscle weakness (generalized)  Other lack of coordination  Difficulty in walking, not elsewhere classified  Repeated falls     Problem List Patient Active Problem List   Diagnosis Date Noted  . Sepsis ([place]) 10/18/2018    Vanellope Passmore PT, DPT 01/23/2019, 3:33 PM  [place] MAIN [facility] SERVICES [address] [place], [place], [address] Phone: [phone]   Fax:  [phone]  Name: JERAMYAH GOODPASTURE MRN: [id number] Date of Birth: April 16, 1945

## 2019-01-23 NOTE — Therapy (Signed)
[place] MAIN [facility] SERVICES [address] [place], [place], [address] Phone: [phone]   Fax:  [phone]  Occupational Therapy Treatment  Patient Details  Name: Kenneth Summers MRN: [id number] Date of Birth: 06/18/1945 No data recorded  Encounter Date: 01/23/2019  OT End of Session - 01/23/19 1801    Visit Number  58    Number of Visits  108    Date for OT Re-Evaluation  04/07/19    Authorization Type  Progress report period starting 12/26/2018    OT Start Time  1350    OT Stop Time  1430    OT Time Calculation (min)  40 min    Activity Tolerance  Patient tolerated treatment well;Patient limited by fatigue    Behavior During Therapy  [facility] for tasks assessed/performed       Past Medical History:  Diagnosis Date  . Alcohol abuse   . APS (antiphospholipid syndrome) ([place])   . BPH (benign prostatic hyperplasia)   . CAD (coronary artery disease)   . Cellulitis   . Cervical spinal stenosis   . Chronic osteomyelitis ([place])   . CKD (chronic kidney disease)   . CKD (chronic kidney disease)   . Clostridium difficile diarrhea   . Depression   . Diplopia   . Fatigue   . GERD (gastroesophageal reflux disease)   . Hyperlipemia   . Hypertension   . Hypovitaminosis D   . IBS (irritable bowel syndrome)   . 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 Insomnia   . Left lumbosacral radiculopathy   . Moderate obstructive sleep apnea   . Osteomyelitis of foot ([place])   . PAD (peripheral artery disease) ([place])   . Pruritus   . RA (rheumatoid arthritis) ([place])   . Radiculopathy   . Restless legs syndrome   . RLS (restless legs syndrome)   . SLE (systemic lupus erythematosus related syndrome) ([place])   . Stroke ([place])   . Toxic maculopathy of both eyes     Past Surgical History:  Procedure Laterality Date  . CARDIAC CATHETERIZATION    . CAROTID ENDARTERECTOMY    . CERVICAL LAMINECTOMY    . CORONARY ANGIOPLASTY    . FRACTURE SURGERY    . fusion C5-6-7     . HERNIA REPAIR    . KNEE ARTHROSCOPY    . TONSILLECTOMY      There were no vitals filed for this visit.  Subjective Assessment - 01/23/19 1800    Subjective   Pt. reports feeling very tired today.    Patient is accompanied by:  Family member    Pertinent History  Pt. is a 73 y.o. 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 Pt. PMHx includes: Multiple Falls, Lupus, DJD, Renal Abscess, CVA. Pt. resides with his wife. Pt.'s wife and daughter assist with ADLs. Pt. has caregivers in  for 2 hours a day, 6 days a week. Pt. received Rehab services in acute care, at SNF for STR, and [place] services. Pt. is retired from [employer] for [employer] and [employer].    Patient Stated Goals  To regain use of his left UE, and do more for himself.    Currently in Pain?  No/denies      OT TREATMENT    Therapeutic Exercise:  Pt. performed 2# dowel ex. For UE strengthening secondary to weakness. Bilateral shoulder flexion, chest press, circular patterns, and elbow flexion/extension were performed. Pt. Worked on left shoulder  AROM while reaching for cones placed at multiple planes. Pt. Required cues for forward trunk flexion when reaching, and placing them onto a tabletop surface.  Selfcare:  Pt. worked on donning a zip up jacket. Pt. required cues to thread the left sleeve, and pull them up the LUE to the shoulder. Pt. required cues to donn the jacket around his neck, and thread his RUE through. Pt. Had increased difficulty today managing his zipper requiring increased verbal, and tactile cues. Pt. Required a pillow placed the left aspect of his trunk for midline sitting secondary to leaning to the left.  Response to Treatment  The pt. and his daughter report that the pt. Has been very fatigued today since having an MD appointment yesterday. Pt. continues to make steady progress overall with LUE functioning. Pt. required increased time, verbal cuing, and visual cues for  donning his jacket, and manipulating the zipper. Pt. required increased rest breaks during each task, and continues to work on LE functioning in order to increase engagement in, and maximize independence in ADL, IADL functioning.                        OT Education - 01/23/19 1801    Education Details  UE functioning    Person(s) Educated  Patient    Methods  Explanation;Demonstration    Comprehension  Returned demonstration;Tactile cues required;Verbalized understanding;Need further instruction          OT Long Term Goals - 01/13/19 1325      OT LONG TERM GOAL #1   Title  Pt. will increase left shoulder flexion AROM by 10 degrees to access cabinet/shelf.    Baseline  Pt. is improving with LUE ROM, and functional reaching.    Time  12    Period  Weeks    Status  On-going    Target Date  04/07/19      OT LONG TERM GOAL #2   Title  Pt. will donn a shirt with Supervision.    Baseline  MinA donning a zip down jacket    Time  12    Period  Weeks    Status  On-going    Target Date  04/07/19      OT LONG TERM GOAL #3   Title  Pt. will require ModA to perform LE dressing    Baseline  MaxA left, ModA with the right. Pt. has sores on his ankle, and feet. Pt.'s daughter is assisting.    Time  12    Period  Weeks    Status  Deferred    Target Date  01/15/20      OT LONG TERM GOAL #4   Title  Pt. will improve left grip strength by 10# to be able to open a jar/container.    Baseline  pt. is making excellent progress with left grip strength, continues to work onopening jars, and containers.    Time  12    Period  Weeks    Status  On-going    Target Date  04/07/19      OT LONG TERM GOAL #5   Title  Pt. will improve left hand [facility] skills to be able to assist with buttoning/zipping.    Baseline  Pt. has made excellent progress with bilateral [facility] skills. Pt. continues to work on improving bilateral [facility] skills for buttoning/zipping    Time  12    Period  Weeks     Status  On-going  Target Date  04/07/19      Long Term Additional Goals   Additional Long Term Goals  Yes      OT LONG TERM GOAL #6   Title  Pt. will demonstrate visual conmpensatory strategies during 100% of the time during ADLs    Baseline  Pt. requires cues at times for left sides awareness.    Time  12    Period  Weeks    Status  On-going    Target Date  04/07/19      OT LONG TERM GOAL #7   Title  Pt. will prepare a simple cold snack from w/c with supervision using cognitive compensatory strategies 100% of the time.    Baseline  Pt. has difficulty with getting the w/c close enough to access the refrigerator    Time  12    Period  Weeks    Status  Deferred    Target Date  04/07/19      OT LONG TERM GOAL #8   Title  Pt. will accurately identify potential safety hazard using good safety awareness, and judgement 100% for ADLs, and IADLs.    Baseline  Pt. continues to be limited    Time  12    Period  Weeks    Status  On-going    Target Date  04/07/19      OT LONG TERM GOAL  #9   TITLE  Pt. will  navigate his w/c around obstacles with Supervision and 100% accuracy    Baseline  Pt. continues to require consistent cuing for obstacle on the left    Time  12    Period  Weeks    Status  On-going    Target Date  04/07/19      OT LONG TERM GOAL  #11   TITLE  Pt. will increase BUE strength by 12mm grades in preparation for ADL transfers.    Baseline  Limited BUE strength    Time  12    Period  Weeks    Status  New    Target Date  04/07/19            Plan - 01/23/19 1802    Clinical Impression Statement The pt. and his daughter report that the pt. Has been very fatigued today since having an MD appointment yesterday. Pt. continues to make steady progress overall with LUE functioning. Pt. required increased time, verbal cuing, and visual cues for donning his jacket, and manipulating the zipper. Pt. required increased rest breaks during each task, and continues to work on  LE functioning in order to increase engagement in, and maximize independence in ADL, IADL functioning.   OT Occupational Profile and History  Problem Focused Assessment - Including review of records relating to presenting problem    Occupational performance deficits (Please refer to evaluation for details):  ADL's    Body Structure / Function / Physical Skills  UE functional use;Coordination;FMC;Dexterity;Strength;ROM    Cognitive Skills  Attention;Memory;Emotional;Problem Solve;Safety Awareness    Rehab Potential  Good    Clinical Decision Making  Several treatment options, min-mod task modification necessary    Comorbidities Affecting Occupational Performance:  Presence of comorbidities impacting occupational performance    Comorbidities impacting occupational performance description:  Phyical, cognitive, visual,  medical comorbidities    Modification or Assistance to Complete Evaluation   No modification of tasks or assist necessary to complete eval    OT Frequency  2x / week    OT Duration  12 weeks    OT Treatment/Interventions  Self-care/ADL training;DME and/or AE instruction;Therapeutic exercise;Therapeutic activities;Moist Heat;Cognitive remediation/compensation;Neuromuscular education;Visual/perceptual remediation/compensation;Coping strategies training;Patient/family education;Passive range of motion;Psychosocial skills training;Energy conservation;Functional Mobility Training    Consulted and Agree with Plan of Care  Patient    Family Member Consulted  Daughter [name]       Patient will benefit from skilled therapeutic intervention in order to improve the following deficits and impairments:   Body Structure / Function / Physical Skills: UE functional use, Coordination, FMC, Dexterity, Strength, ROM Cognitive Skills: Attention, Memory, Emotional, Problem Solve, Safety Awareness     Visit Diagnosis: Muscle weakness (generalized)  Other lack of coordination    Problem  List Patient Active Problem List   Diagnosis Date Noted  . Sepsis ([place]) 10/18/2018    Harrel Carina, MS, OTR/L 01/23/2019, 6:11 PM  [place] MAIN [facility] SERVICES [address] [place], [place], [address] Phone: [phone]   Fax:  [phone]  Name: KERWIN POEHLMAN MRN: [id number] Date of Birth: 27-Apr-1945

## 2019-01-27 ENCOUNTER — Other Ambulatory Visit: Payer: Self-pay

## 2019-01-27 ENCOUNTER — Ambulatory Visit: Payer: Medicare Other | Admitting: Occupational Therapy

## 2019-01-27 ENCOUNTER — Ambulatory Visit: Payer: Medicare Other

## 2019-01-27 ENCOUNTER — Encounter: Payer: Self-pay | Admitting: Occupational Therapy

## 2019-01-27 DIAGNOSIS — R278 Other lack of coordination: Secondary | ICD-10-CM

## 2019-01-27 DIAGNOSIS — C4492 Squamous cell carcinoma of skin, unspecified: Secondary | ICD-10-CM

## 2019-01-27 DIAGNOSIS — M6281 Muscle weakness (generalized): Secondary | ICD-10-CM

## 2019-01-27 DIAGNOSIS — R262 Difficulty in walking, not elsewhere classified: Secondary | ICD-10-CM

## 2019-01-27 HISTORY — DX: Squamous cell carcinoma of skin, unspecified: C44.92

## 2019-01-27 NOTE — Therapy (Signed)
[place] MAIN [facility] SERVICES [address] [place], [place], [address] Phone: [phone]   Fax:  [phone]  Occupational Therapy Treatment  Patient Details  Name: Kenneth Summers MRN: [id number] Date of Birth: 1945/06/02 No data recorded  Encounter Date: 01/27/2019  OT End of Session - 01/27/19 1402    Visit Number  80    Number of Visits  108    Date for OT Re-Evaluation  04/07/19    Authorization Type  Progress report period starting 12/26/2018    OT Start Time  1345    OT Stop Time  1430    OT Time Calculation (min)  45 min    Activity Tolerance  Patient tolerated treatment well;Patient limited by fatigue    Behavior During Therapy  [facility] for tasks assessed/performed       Past Medical History:  Diagnosis Date  . Alcohol abuse   . APS (antiphospholipid syndrome) ([place])   . BPH (benign prostatic hyperplasia)   . CAD (coronary artery disease)   . Cellulitis   . Cervical spinal stenosis   . Chronic osteomyelitis ([place])   . CKD (chronic kidney disease)   . CKD (chronic kidney disease)   . Clostridium difficile diarrhea   . Depression   . Diplopia   . Fatigue   . GERD (gastroesophageal reflux disease)   . Hyperlipemia   . Hypertension   . Hypovitaminosis D   . IBS (irritable bowel syndrome)   . 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 Insomnia   . Left lumbosacral radiculopathy   . Moderate obstructive sleep apnea   . Osteomyelitis of foot ([place])   . PAD (peripheral artery disease) ([place])   . Pruritus   . RA (rheumatoid arthritis) ([place])   . Radiculopathy   . Restless legs syndrome   . RLS (restless legs syndrome)   . SLE (systemic lupus erythematosus related syndrome) ([place])   . Stroke ([place])   . Toxic maculopathy of both eyes     Past Surgical History:  Procedure Laterality Date  . CARDIAC CATHETERIZATION    . CAROTID ENDARTERECTOMY    . CERVICAL LAMINECTOMY    . CORONARY ANGIOPLASTY    . FRACTURE SURGERY    . fusion C5-6-7     . HERNIA REPAIR    . KNEE ARTHROSCOPY    . TONSILLECTOMY      There were no vitals filed for this visit.  Subjective Assessment - 01/27/19 1401    Subjective   Pt. reports having had difficulty fastening his jacket zipper this morning.    Patient is accompanied by:  Family member    Pertinent History  Pt. is a 73 y.o. 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 Pt. PMHx includes: Multiple Falls, Lupus, DJD, Renal Abscess, CVA. Pt. resides with his wife. Pt.'s wife and daughter assist with ADLs. Pt. has caregivers in  for 2 hours a day, 6 days a week. Pt. received Rehab services in acute care, at SNF for STR, and [place] services. Pt. is retired from [employer] for [employer] and [employer].    Currently in Pain?  No/denies       OT TREATMENT    Neuro muscular re-education:   Pt. Worked on reaching  With his LUE for various targets with cues to forward flexing at the trunk.  Therapeutic Exercise:  Pt. performed 2# dowel ex. For UE strengthening secondary to weakness. Bilateral shoulder flexion, chest press,  circular patterns, and elbow flexion/extension were performed. Pt. Performed 2# dumbbell ex. for elbow flexion and extension, forearm supination/pronation, wrist flexion/extension, and radial deviation. Pt. requires rest breaks and verbal cues for proper technique.  Selfcare:  Pt. worked on donning a zip up jacket. Pt. required  Fewer cues to thread the left sleeve, and pull it up the LUE to the shoulder. Pt. required cues to donn the jacket around his neck, and thread his RUE through. Pt. Required verbal, and tactile cues to manage his zipper.    Response to Treatment:  Pt. continues to make progress with LUE ROM, left UE strength, grip strength, and [place] skills. Pt. continues to require minA for donning his sweater, and required increased assist to reposition the sweatshirt, and manage the zipper. Pt. continues to work on improving LUE  functioning, LUE functional reaching, and left sided awareness in order increase engagement in ADLs, and IADLs, and maximize overall independence.                     OT Education - 01/27/19 1402    Education Details  UE functioning    Person(s) Educated  Patient    Methods  Explanation;Demonstration    Comprehension  Returned demonstration;Tactile cues required;Verbalized understanding;Need further instruction          OT Long Term Goals - 01/27/19 1404      OT LONG TERM GOAL #1   Title  Pt. will increase left shoulder flexion AROM by 10 degrees to access cabinet/shelf.    Baseline  Pt. is improving with LUE ROM, and functional reaching.    Time  12    Period  Weeks    Status  On-going    Target Date  04/07/19      OT LONG TERM GOAL #2   Title  Pt. will donn a shirt with Supervision.    Baseline  MinA donning a zip down jacket    Time  12    Period  Weeks    Status  On-going    Target Date  04/07/19      OT LONG TERM GOAL #4   Title  Pt. will improve left grip strength by 10# to be able to open a jar/container.    Time  12    Period  Weeks    Status  On-going    Target Date  04/07/19      OT LONG TERM GOAL #5   Title  Pt. will improve left hand [facility] skills to be able to assist with buttoning/zipping.    Baseline  Pt. has made excellent progress with bilateral [facility] skills. Pt. continues to work on improving bilateral [facility] skills for buttoning/zipping    Time  12    Period  Weeks    Status  On-going    Target Date  04/07/19      OT LONG TERM GOAL #6   Title  Pt. will demonstrate visual conmpensatory strategies during 100% of the time during ADLs    Baseline  Pt. requires cues at times for left sides awareness.    Time  12    Period  Weeks    Status  On-going    Target Date  04/07/19      OT LONG TERM GOAL #8   Title  Pt. will accurately identify potential safety hazard using good safety awareness, and judgement 100% for ADLs, and IADLs.     Baseline  Pt. continues to be limited  Time  12    Period  Weeks    Status  On-going    Target Date  04/07/19      OT LONG TERM GOAL  #9   TITLE  Pt. will  navigate his w/c around obstacles with Supervision and 100% accuracy    Baseline  Pt. continues to require consistent cuing for obstacle on the left    Time  12    Period  Weeks    Status  On-going    Target Date  04/07/19      OT LONG TERM GOAL  #11   TITLE  Pt. will increase BUE strength by 78mm grades in preparation for ADL transfers.    Baseline  Limited BUE strength    Time  12    Period  Weeks    Status  On-going    Target Date  04/07/19            Plan - 01/27/19 1403    Clinical Impression Statement Pt. continues to make progress with LUE ROM, left UE strength, grip strength, and [place] skills. Pt. continues to require minA for donning his sweater, and required increased assist to reposition the sweatshirt, and manage the zipper. Pt. continues to work on improving LUE functioning, LUE functional reaching, and left sided awareness in order increase engagement in ADLs, and IADLs, and maximize overall independence.    OT Occupational Profile and History  Problem Focused Assessment - Including review of records relating to presenting problem    Occupational performance deficits (Please refer to evaluation for details):  ADL's    Body Structure / Function / Physical Skills  UE functional use;Coordination;FMC;Dexterity;Strength;ROM    Cognitive Skills  Attention;Memory;Emotional;Problem Solve;Safety Awareness    Rehab Potential  Good    Clinical Decision Making  Several treatment options, min-mod task modification necessary    Comorbidities Affecting Occupational Performance:  Presence of comorbidities impacting occupational performance    Comorbidities impacting occupational performance description:  Phyical, cognitive, visual,  medical comorbidities    Modification or Assistance to Complete Evaluation   No modification of  tasks or assist necessary to complete eval    OT Frequency  2x / week    OT Duration  12 weeks    OT Treatment/Interventions  Self-care/ADL training;DME and/or AE instruction;Therapeutic exercise;Therapeutic activities;Moist Heat;Cognitive remediation/compensation;Neuromuscular education;Visual/perceptual remediation/compensation;Coping strategies training;Patient/family education;Passive range of motion;Psychosocial skills training;Energy conservation;Functional Mobility Training    Recommended Other Services  PT    Consulted and Agree with Plan of Care  Patient       Patient will benefit from skilled therapeutic intervention in order to improve the following deficits and impairments:   Body Structure / Function / Physical Skills: UE functional use, Coordination, FMC, Dexterity, Strength, ROM Cognitive Skills: Attention, Memory, Emotional, Problem Solve, Safety Awareness     Visit Diagnosis: Muscle weakness (generalized)  Other lack of coordination    Problem List Patient Active Problem List   Diagnosis Date Noted  . Sepsis ([place]) 10/18/2018    Harrel Carina, MS, OTR/L 01/27/2019, 5:44 PM  [place] MAIN [facility] SERVICES [address] [place], [place], [address] Phone: [phone]   Fax:  [phone]  Name: Kenneth Summers MRN: [id number] Date of Birth: Jun 11, 1945

## 2019-01-27 NOTE — Therapy (Signed)
[place] [facility] SERVICES [address] [place], [place], [address] Phone: [phone]   Fax:  [phone]  Physical Therapy Treatment  Patient Details  Name: Kenneth Summers MRN: [id number] Date of Birth: [birth year] Referring Provider (PT): Dr. [name], Guy Begin   Encounter Date: 01/27/2019  PT End of Session - 01/27/19 1303    Visit Number  [id number]    Number of Visits  108    Date for PT Re-Evaluation  02/27/19    Authorization Type  Medicare reporting period starting 09/11/18    Authorization Time Period  goals last assessed 01/13/19    PT Start Time  1305    PT Stop Time  1345    PT Time Calculation (min)  40 min    Equipment Utilized During Treatment  Gait belt    Activity Tolerance  Patient tolerated treatment well    Behavior During Therapy  [facility] for tasks assessed/performed       Past Medical History:  Diagnosis Date  . Alcohol abuse   . APS (antiphospholipid syndrome) ([place])   . BPH (benign prostatic hyperplasia)   . CAD (coronary artery disease)   . Cellulitis   . Cervical spinal stenosis   . Chronic osteomyelitis ([place])   . CKD (chronic kidney disease)   . CKD (chronic kidney disease)   . Clostridium difficile diarrhea   . Depression   . Diplopia   . Fatigue   . GERD (gastroesophageal reflux disease)   . Hyperlipemia   . Hypertension   . Hypovitaminosis D   . IBS (irritable bowel syndrome)   . 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 Insomnia   . Left lumbosacral radiculopathy   . Moderate obstructive sleep apnea   . Osteomyelitis of foot ([place])   . PAD (peripheral artery disease) ([place])   . Pruritus   . RA (rheumatoid arthritis) ([place])   . Radiculopathy   . Restless legs syndrome   . RLS (restless legs syndrome)   . SLE (systemic lupus erythematosus related syndrome) ([place])   . Stroke ([place])   . Toxic maculopathy of both eyes     Past Surgical History:  Procedure Laterality Date  . CARDIAC CATHETERIZATION    . CAROTID  ENDARTERECTOMY    . CERVICAL LAMINECTOMY    . CORONARY ANGIOPLASTY    . FRACTURE SURGERY    . fusion C5-6-7    . HERNIA REPAIR    . KNEE ARTHROSCOPY    . TONSILLECTOMY      There were no vitals filed for this visit.  Subjective Assessment - 01/27/19 1527    Subjective  Pt reports he is doing well today however daughter states that he has been tired this morning. He denies any pain at rest upon arrival and no specific questions or concerns.    Pertinent History  Patient is a 73 y.o. male who presents to outpatient physical therapy with a referral for medical diagnosis of CVA. This patient's chief complaints consist of left hemiplegia and overall deconditioning leading to the following functional deficits: dependent for ADLs, IADLs, unable to transfer to car, dependent transfers at home with hoyer lift, unable to walk or stand without significant assistance, difficulty with W/C navigation..    Limitations  Sitting;Lifting;Standing;House hold activities;Writing;Walking;Other (comment)    Currently in Pain?  No/denies         TREATMENT   Therapeutic Activity Patient transferredstandpivot wheelchair to nustep with BUE and therapist assistance, mod A +1;Patient does require increased  time for proper positioning and struggles more today during sequencing with transfers. He requires cues for walking LLE foot around and required min A for placing LLE foot onto foot plate;  Afterward transferred nustep to wheelchair squat pivotwith min A +2 for safety;Patient required cues for hand placement and positioningincluding to increase forward weight shift;  Patient sitting in power chair with power chair elevated and tilted forward for better positioning:(seat height 27 inches from floor) Sit<>stand transfersat // bars x 3reps; He was able to transfer with min/modA+1 today with increased fatigue noted compared to previous sessions. He did not require therapist assist to block LLE knee but  did require assistance for forward weight shift, proper positioning, and cues to push with RUE from wheelchair. Patient able to stand for approximately 30 sec each repetition which is improved from previous session but still very limited. He is easily fatigued during session and with standing reports some increase in R knee pain. Instructed patient to reach back for chair to improve safety awareness.  He required minA for repositioning in chair at end of session. Reinforced HEP;   Ther-ex  Instructed patient in Nustep, seat #17 (pt is more slouched/sacral posture today) BLE only level0/1 x 43mnwith cues to increase speedto around 40-45steps per minute to challenge cardiovascular endurance. Cues also required to avoid stopping and to continue exercise. Distance propelled: 0.119mes, patient exhibits increased fatigue today and more challenge with BLE only.   Patient seated:  Alternating LAQ x 15 reps each with mod VCs to increase AROM for better muscle activation and strengthening in each LE. Patient able to achieve partial ROM on LLE due to weakness; Seated marches alternating LE x 15 each; Seated clams with manual resistance from therapist x 15; Seated adductor squeeze with manual resistance from therapist x 15;   Pt educated throughout session about proper posture and technique with exercises. Improved exercise technique, movement at target joints, use of target muscles after min to mod verbal, visual, tactile cues.    Patientdemonstrates good motivation throughout today's session. He is easily fatigued during session today and is only able to remain standing for approximately 30s during each bout. He is also able to complete 5 minutes on the nustep and perform seated exercises with therapist as well.Pt will benefit from continued skilled PT services in order to increase strength and functional mobilityin order to decrease caregiver burden, improve his ability to perform ADLs, and  to improve his QOL.                        PT Short Term Goals - 01/13/19 1721      PT SHORT TERM GOAL #1   Title  Be independent with initial home exercise program for self-management of symptoms.    Baseline  HEP to be given at second session; advice to practice unsupported sitting at home (06/19/2018); 07/08/18: Performing at home, 6/29: needs assistance but is doing exercise program regularly; 08/22/18: adherent;    Time  2    Period  Weeks    Status  Achieved    Target Date  06/26/18        PT Long Term Goals - 01/13/19 1721      PT LONG TERM GOAL #1   Title  Patient will complete all bed mobility with min A to improve functional independence for getting in and out of bed and adjusting in bed.     Baseline  max A - total A reported  by family (06/19/2018); 07/08/18: still requires maxA, dtr reports improvement in rolling since starting therapy; 6/23: min A to supervision in clinic; not doing at home right; 8/5: Pt's daughter states that his rolling has improved, but still has difficulty with all other bed mobility; 10/23/18: pts daughter states that she is doing about 35% of the work if he is in proper position, he does uses the bed rails to help roll;  using Hoyer for coming to sit EOB; 10/12: pts daughter states that she is doing about 30% of the work if he is in proper position, he does uses the bed rails to help roll, but it is improving;  using Hoyer for coming to sit EOB, 10/29: min A for sit to sidelying, rolling supervision, able to transition sidelying to sit with HHA;    Time  12    Period  Weeks    Status  Achieved      PT LONG TERM GOAL #2   Title  Patient will complete sit <> stand transfer chair to chair with Min A and LRAD to improve functional independence for household and community mobility.     Baseline  max A +2 STS at W/C (06/12/2018); max A +2-3 STS or stand pivot transfer, able to complete more reps (06/19/2018); 07/08/18: not safe to attempt at this  time; 09/11/18: requires maxA+1 or modA+2 for STSs and squat pivot transfer; 10/23/18: requires maxA+1 or modA+2 for STSs and squat pivot transfer; 10/12: requires maxA+1 or modA+2 for STSs and squat pivot transfer; 10/29: patient able to complete sit<>Stand transfer min A +1 with parallel bars and RW, although inconsistent, mod A for stand pivot; 01/13/19: Pt able to perform sit to stand with parallel bars and RW with minA+1 and a few times with CGA only, still requires modA+1 for stand pivot transfer.    Time  12    Period  Weeks    Status  Partially Met    Target Date  02/27/19      PT LONG TERM GOAL #3   Title  Patient will navigate power w/c with min A x 100 feet to improve mobility for household and short community distances.    Baseline  required total A (06/12/2018); able to roll 20 feet with min A (06/20/2018); 07/08/18: Pt can go approximately 10' without assist per family;  10/23/2018: pt's daughter states that his power WC mobility has become 100% better with the new hand control, slowed speed, and with practice; 10/12: pt's daughter reports that he can perform household WC mobility and limited community ambulation with minA    Time  12    Period  Weeks    Status  Achieved      PT LONG TERM GOAL #4   Title  Patient will complete car transfers W/C to family SUV with min A using LRAD to improve his abilty to participate in community activities.     Baseline  unable to complete car transfers at all (06/12/2018); unable to complete car transfers at all (06/19/2018).; 07/08/18: Unable to perform at this time; 09/11/18: unable to perform at this time; 10/23/2018: unable to complete car transfers at all; 10/12:  unable to complete car transfers at all, 10/29: unable; 01/13/19: unable;    Time  12    Period  Weeks    Status  On-going    Target Date  02/27/19      PT LONG TERM GOAL #5   Title  Patient will ambulate at least 150  feet with mod I using LRAD to improve mobility for household and community  distances.     Baseline  unable to take steps (06/12/2018); able to shift R leg forward and back with max A and RW at edge of plinth (06/19/2018); 07/08/18: unable to ambulate at this time. 09/11/18: unable to perform at this time; 10/23/2018: unable to perform at this time; 10/12: unable to perform at this time, 10/29: unable at this time; 01/13/19: unable at this time    Time  12    Period  Weeks    Status  On-going    Target Date  02/27/19      PT LONG TERM GOAL #6   Title  Patient will be able to safety navigate up/down ramp with power chair, exhibiting good safety awareness, mod I to safely enter/exit his home.    Baseline  unable to complet stairs (06/12/2018); unable to complete stairs (06/19/2018); 07/08/18: unable to attempt at this time; 09/11/18: unsafe to attempt at this time; 10/23/2018: unable to complet stairs (06/12/2018); unable to complete stairs (06/19/2018); 07/08/18: unable to attempt at this time; 09/11/18: unsafe to attempt at this time; 10/23/18: unsafe to attempt at this time, 10/29: requires superivison and occasional min A for safety; 01/13/19: unchanged from last update    Time  12    Period  Weeks    Status  On-going    Target Date  02/27/19      PT LONG TERM GOAL #7   Title  Patient will increase functional reach test to >15 inches in sitting to exhibit improved sitting balance and positioning and reduce fall risk;    Baseline  07/30/18: 12 inches; 09/11/18: 10-12 inches; 10/23/18: 12-13 inch; 10/12: 14.5", 10/29: 15.5 inch    Time  4    Period  Weeks    Status  Achieved      PT LONG TERM GOAL #8   Title  Patient will increase LLE quad strength to 3+/5 to improve functional strength for standing and mobility;    Baseline  10/28: 2+/5; 01/13/19: 2+/5    Time  12    Period  Weeks    Status  On-going    Target Date  02/27/19            Plan - 01/27/19 1313    Clinical Impression Statement  Patient demonstrates good motivation throughout today's session. He is easily fatigued during  session today and is only able to remain standing for approximately 30s during each bout. He is also able to complete 5 minutes on the nustep and perform seated exercises with therapist as well. Pt will benefit from continued skilled PT services in order to increase strength and functional mobility in order to decrease caregiver burden, improve his ability to perform ADLs, and to improve his QOL.    Personal Factors and Comorbidities  Age;Comorbidity 3+    Comorbidities  Relevant past medical history and comorbidities include long term steroid use for lupus, CKD, chronic osteomyelitis, cervical spine stenosis, BPH, APS, alcohol abuse, peripheral artery disease, depression, GERD, obstructive sleep apnea, HTN, IBS, IgA deficiency, lumbar radiculopathy, RA, Stroke, toxic maculopathy in both eyes, systemic lupus erythematosus related syndrome, cardiac catheterization, carotid endarterectomy, cervical laminectomy, coronary angioplasty, Fusion C5-C7, knee arthroplasty, lumbar surgery.    Examination-Activity Limitations  Bed Mobility;Bend;Sit;Toileting;Stand;Stairs;Lift;Transfers;Squat;Locomotion Level;Carry;Dressing;Hygiene/Grooming;Continence    Examination-Participation Restrictions  Yard Work;Interpersonal Relationship;Community Activity    Stability/Clinical Decision Making  Evolving/Moderate complexity    Rehab Potential  Fair    PT  Frequency  3x / week    PT Duration  12 weeks    PT Treatment/Interventions  ADLs/Self Care Home Management;Electrical Stimulation;Moist Heat;Cryotherapy;Gait training;Stair training;Functional mobility training;Therapeutic exercise;Balance training;Neuromuscular re-education;Cognitive remediation;Patient/family education;Orthotic Fit/Training;Wheelchair mobility training;Manual techniques;Passive range of motion;Energy conservation;Joint Manipulations    PT Next Visit Plan  Work on strength, sitting/standing balance, functional activities such as rolling, bed mobility,  transfers; caregiver training    PT Home Exercise Plan  handout provided via Beth during previous sessions    Consulted and Agree with Plan of Care  Patient;Family member/caregiver    Family Member Consulted  daughter [name]       Patient will benefit from skilled therapeutic intervention in order to improve the following deficits and impairments:  Abnormal gait, Decreased activity tolerance, Decreased cognition, Decreased endurance, Decreased knowledge of use of DME, Decreased range of motion, Decreased skin integrity, Decreased strength, Impaired perceived functional ability, Impaired sensation, Impaired UE functional use, Improper body mechanics, Pain, Cardiopulmonary status limiting activity, Decreased balance, Decreased coordination, Decreased mobility, Difficulty walking, Impaired tone, Postural dysfunction  Visit Diagnosis: Muscle weakness (generalized)  Difficulty in walking, not elsewhere classified     Problem List Patient Active Problem List   Diagnosis Date Noted  . Sepsis ([place]) 10/18/2018   Phillips Grout PT, DPT, GCS  Amiri Riechers 01/27/2019, 3:38 PM  [place] [facility] SERVICES [address] [place], [place], [address] Phone: [phone]   Fax:  [phone]  Name: Kenneth Summers MRN: [id number] Date of Birth: September 16, 1945

## 2019-01-29 ENCOUNTER — Encounter: Payer: Self-pay | Admitting: Physical Therapy

## 2019-01-29 ENCOUNTER — Ambulatory Visit: Payer: Medicare Other | Admitting: Occupational Therapy

## 2019-01-29 ENCOUNTER — Ambulatory Visit: Payer: Medicare Other

## 2019-01-29 ENCOUNTER — Encounter: Payer: Self-pay | Admitting: Occupational Therapy

## 2019-01-29 ENCOUNTER — Other Ambulatory Visit: Payer: Self-pay

## 2019-01-29 DIAGNOSIS — R262 Difficulty in walking, not elsewhere classified: Secondary | ICD-10-CM

## 2019-01-29 DIAGNOSIS — M6281 Muscle weakness (generalized): Secondary | ICD-10-CM

## 2019-01-29 DIAGNOSIS — R633 Feeding difficulties, unspecified: Secondary | ICD-10-CM

## 2019-01-29 DIAGNOSIS — R41841 Cognitive communication deficit: Secondary | ICD-10-CM

## 2019-01-29 DIAGNOSIS — R278 Other lack of coordination: Secondary | ICD-10-CM

## 2019-01-29 DIAGNOSIS — I69354 Hemiplegia and hemiparesis following cerebral infarction affecting left non-dominant side: Secondary | ICD-10-CM

## 2019-01-29 DIAGNOSIS — H547 Unspecified visual loss: Secondary | ICD-10-CM

## 2019-01-29 DIAGNOSIS — R296 Repeated falls: Secondary | ICD-10-CM

## 2019-01-29 NOTE — Therapy (Signed)
[place] MAIN [facility] SERVICES [address] [place], [place], [address] Phone: [phone]   Fax:  [phone]  Occupational Therapy Treatment  Patient Details  Name: Kenneth Summers MRN: [id number] Date of Birth: 28-Sep-1945 No data recorded  Encounter Date: 01/29/2019  OT End of Session - 01/29/19 1745    Visit Number  81    Number of Visits  108    Date for OT Re-Evaluation  04/07/19    Authorization Type  Progress report period starting 12/26/2018    OT Start Time  1348    OT Stop Time  1430    OT Time Calculation (min)  42 min    Activity Tolerance  Patient tolerated treatment well;Patient limited by fatigue    Behavior During Therapy  [facility] for tasks assessed/performed       Past Medical History:  Diagnosis Date  . Alcohol abuse   . APS (antiphospholipid syndrome) ([place])   . BPH (benign prostatic hyperplasia)   . CAD (coronary artery disease)   . Cellulitis   . Cervical spinal stenosis   . Chronic osteomyelitis ([place])   . CKD (chronic kidney disease)   . CKD (chronic kidney disease)   . Clostridium difficile diarrhea   . Depression   . Diplopia   . Fatigue   . GERD (gastroesophageal reflux disease)   . Hyperlipemia   . Hypertension   . Hypovitaminosis D   . IBS (irritable bowel syndrome)   . 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 Insomnia   . Left lumbosacral radiculopathy   . Moderate obstructive sleep apnea   . Osteomyelitis of foot ([place])   . PAD (peripheral artery disease) ([place])   . Pruritus   . RA (rheumatoid arthritis) ([place])   . Radiculopathy   . Restless legs syndrome   . RLS (restless legs syndrome)   . SLE (systemic lupus erythematosus related syndrome) ([place])   . Stroke ([place])   . Toxic maculopathy of both eyes     Past Surgical History:  Procedure Laterality Date  . CARDIAC CATHETERIZATION    . CAROTID ENDARTERECTOMY    . CERVICAL LAMINECTOMY    . CORONARY ANGIOPLASTY    . FRACTURE SURGERY    . fusion C5-6-7     . HERNIA REPAIR    . KNEE ARTHROSCOPY    . TONSILLECTOMY      There were no vitals filed for this visit.  Subjective Assessment - 01/29/19 1745    Subjective   Pt. reports that his sister, and brother in law are both sick with COVID.    Patient is accompanied by:  Family member    Pertinent History  Pt. is a 73 y.o. 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 Pt. PMHx includes: Multiple Falls, Lupus, DJD, Renal Abscess, CVA. Pt. resides with his wife. Pt.'s wife and daughter assist with ADLs. Pt. has caregivers in  for 2 hours a day, 6 days a week. Pt. received Rehab services in acute care, at SNF for STR, and [place] services. Pt. is retired from [employer] for [employer] and [employer].    Currently in Pain?  No/denies       OT TREATMENT   TherapeuticActivities:  Pt. worked onusing his left hand forgrasping, large pegs and aligning them onto a large Instructo board placed at a tabletop surface.Pt. Required verbal cues, visual cues, increased time, and cues to use his left hand to follow complex [employer]  Bethena Roys design patterns.   Selfcare:  Pt. worked on UE dressing tasks using hemi-dressing techniques. Pt. requiredlesscues forinitiatingposition the sweatshirtwhenpreparing todonn a zip down sweatshirt. Pt. Requiredfewercues to thread the LUE with the right, assist to bring the jacket around his back, and reposition the jacket to prepare for zipping. Pt. Required assist to align the zipper in his hands.  Response to Treatment:   Pt. required less cuing today to engage his left hand during tasks. With verbal cues, visual cues, and increased time pt. was able to self-correct the position on the pegs on the borad to follow the design patterns with increased time to complete. Pt. continues to require assist with donning his jacket, however required fewer cues, and assist to initiate threading the jacket over his left UE, bring the jacket  round his neck, and manipulate initially connecting the zipper.                            OT Education - 01/29/19 1745    Education Details  UE functioning    Person(s) Educated  Patient    Methods  Explanation;Demonstration    Comprehension  Returned demonstration;Tactile cues required;Verbalized understanding;Need further instruction          OT Long Term Goals - 01/27/19 1404      OT LONG TERM GOAL #1   Title  Pt. will increase left shoulder flexion AROM by 10 degrees to access cabinet/shelf.    Baseline  Pt. is improving with LUE ROM, and functional reaching.    Time  12    Period  Weeks    Status  On-going    Target Date  04/07/19      OT LONG TERM GOAL #2   Title  Pt. will donn a shirt with Supervision.    Baseline  MinA donning a zip down jacket    Time  12    Period  Weeks    Status  On-going    Target Date  04/07/19      OT LONG TERM GOAL #4   Title  Pt. will improve left grip strength by 10# to be able to open a jar/container.    Time  12    Period  Weeks    Status  On-going    Target Date  04/07/19      OT LONG TERM GOAL #5   Title  Pt. will improve left hand [facility] skills to be able to assist with buttoning/zipping.    Baseline  Pt. has made excellent progress with bilateral [facility] skills. Pt. continues to work on improving bilateral [facility] skills for buttoning/zipping    Time  12    Period  Weeks    Status  On-going    Target Date  04/07/19      OT LONG TERM GOAL #6   Title  Pt. will demonstrate visual conmpensatory strategies during 100% of the time during ADLs    Baseline  Pt. requires cues at times for left sides awareness.    Time  12    Period  Weeks    Status  On-going    Target Date  04/07/19      OT LONG TERM GOAL #8   Title  Pt. will accurately identify potential safety hazard using good safety awareness, and judgement 100% for ADLs, and IADLs.    Baseline  Pt. continues to be limited    Time  12  Period  Weeks     Status  On-going    Target Date  04/07/19      OT LONG TERM GOAL  #9   TITLE  Pt. will  navigate his w/c around obstacles with Supervision and 100% accuracy    Baseline  Pt. continues to require consistent cuing for obstacle on the left    Time  12    Period  Weeks    Status  On-going    Target Date  04/07/19      OT LONG TERM GOAL  #11   TITLE  Pt. will increase BUE strength by 41mm grades in preparation for ADL transfers.    Baseline  Limited BUE strength    Time  12    Period  Weeks    Status  On-going    Target Date  04/07/19            Plan - 01/29/19 1746    Clinical Impression Statement  Pt. required less cuing today to engage his left hand during tasks. With verbal cues, visual cues, and increased time pt. was able to self-correct the position on the pegs on the borad to follow the design patterns with increased time to complete. Pt. continues to require assist with donning his jacket, however required fewer cues, and assist to initiate threading the jacket over his left UE, bring the jacket round his neck, and manipulate initially connecting the zipper.    OT Occupational Profile and History  Problem Focused Assessment - Including review of records relating to presenting problem    Occupational performance deficits (Please refer to evaluation for details):  ADL's    Body Structure / Function / Physical Skills  UE functional use;Coordination;FMC;Dexterity;Strength;ROM    Cognitive Skills  Attention;Memory;Emotional;Problem Solve;Safety Awareness    Rehab Potential  Good    Clinical Decision Making  Several treatment options, min-mod task modification necessary    Comorbidities Affecting Occupational Performance:  Presence of comorbidities impacting occupational performance    Comorbidities impacting occupational performance description:  Phyical, cognitive, visual,  medical comorbidities    Modification or Assistance to Complete Evaluation   No modification of tasks or  assist necessary to complete eval    OT Frequency  2x / week    OT Duration  12 weeks    OT Treatment/Interventions  Self-care/ADL training;DME and/or AE instruction;Therapeutic exercise;Therapeutic activities;Moist Heat;Cognitive remediation/compensation;Neuromuscular education;Visual/perceptual remediation/compensation;Coping strategies training;Patient/family education;Passive range of motion;Psychosocial skills training;Energy conservation;Functional Mobility Training    Consulted and Agree with Plan of Care  Patient    Family Member Consulted  Daughter [name]       Patient will benefit from skilled therapeutic intervention in order to improve the following deficits and impairments:   Body Structure / Function / Physical Skills: UE functional use, Coordination, FMC, Dexterity, Strength, ROM Cognitive Skills: Attention, Memory, Emotional, Problem Solve, Safety Awareness     Visit Diagnosis: Muscle weakness (generalized)  Other lack of coordination  Cognitive communication deficit    Problem List Patient Active Problem List   Diagnosis Date Noted  . Sepsis ([place]) 10/18/2018    Harrel Carina, MS, OTR/L 01/29/2019, 5:54 PM  [place] MAIN [facility] SERVICES [address] [place], [place], [address] Phone: [phone]   Fax:  [phone]  Name: Kenneth Summers MRN: [id number] Date of Birth: 05-27-1945

## 2019-01-29 NOTE — Therapy (Signed)
[place] MAIN [facility] SERVICES [address] [place], [place], [address] Phone: [phone]   Fax:  [phone]  Physical Therapy Treatment  Patient Details  Name: Kenneth Summers MRN: [id number] Date of Birth: 28-May-1945 Referring Provider (PT): Dr. [name], Guy Begin   Encounter Date: 01/29/2019  PT End of Session - 01/29/19 1507    Visit Number  54    Number of Visits  108    Date for PT Re-Evaluation  02/27/19    Authorization Type  Medicare reporting period starting 09/11/18    Authorization Time Period  goals last assessed 01/13/19    Authorization - Visit Number  8    Authorization - Number of Visits  10    PT Start Time  1300    PT Stop Time  1345    PT Time Calculation (min)  45 min    Equipment Utilized During Treatment  Gait belt    Activity Tolerance  Patient tolerated treatment well    Behavior During Therapy  [facility] for tasks assessed/performed       Past Medical History:  Diagnosis Date  . Alcohol abuse   . APS (antiphospholipid syndrome) ([place])   . BPH (benign prostatic hyperplasia)   . CAD (coronary artery disease)   . Cellulitis   . Cervical spinal stenosis   . Chronic osteomyelitis ([place])   . CKD (chronic kidney disease)   . CKD (chronic kidney disease)   . Clostridium difficile diarrhea   . Depression   . Diplopia   . Fatigue   . GERD (gastroesophageal reflux disease)   . Hyperlipemia   . Hypertension   . Hypovitaminosis D   . IBS (irritable bowel syndrome)   . 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 Insomnia   . Left lumbosacral radiculopathy   . Moderate obstructive sleep apnea   . Osteomyelitis of foot ([place])   . PAD (peripheral artery disease) ([place])   . Pruritus   . RA (rheumatoid arthritis) ([place])   . Radiculopathy   . Restless legs syndrome   . RLS (restless legs syndrome)   . SLE (systemic lupus erythematosus related syndrome) ([place])   . Stroke ([place])   . Toxic maculopathy of both eyes     Past Surgical  History:  Procedure Laterality Date  . CARDIAC CATHETERIZATION    . CAROTID ENDARTERECTOMY    . CERVICAL LAMINECTOMY    . CORONARY ANGIOPLASTY    . FRACTURE SURGERY    . fusion C5-6-7    . HERNIA REPAIR    . KNEE ARTHROSCOPY    . TONSILLECTOMY      There were no vitals filed for this visit.  Subjective Assessment - 01/29/19 1453    Subjective  Patient reported that he was very tired this morning but feels better this afternoon. No complaints at this time.    Patient is accompained by:  Family member    Pertinent History  Patient is a 73 y.o. male who presents to outpatient physical therapy with a referral for medical diagnosis of CVA. This patient's chief complaints consist of left hemiplegia and overall deconditioning leading to the following functional deficits: dependent for ADLs, IADLs, unable to transfer to car, dependent transfers at home with hoyer lift, unable to walk or stand without significant assistance, difficulty with W/C navigation..    Limitations  Sitting;Lifting;Standing;House hold activities;Writing;Walking;Other (comment)    Diagnostic tests  Brain MRI 11/01/2017: IMPRESSION: 1. Acute right ACA territory infarct as demonstrated on prior  CT imaging. No intracranial hemorrhage or significant mass effect. 2. 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    Patient Stated Goals  wants to be able to walk again    Currently in Pain?  No/denies       TREATMENT     Therapeutic Activity  Patient sitting in power chair with power chair elevated and tilted forward for better positioning: (seat height 27 inches from floor) Sit<>stand transfers at // bars x 4 reps;  He was able to transfer with min+1-2 today. Tactile/verbal cues utilized to encourage anterior weight shift, knee extension in standing, UE placement and hip extension. Patient able to stand for ea rep ~33mn, ~30sec, ~20sec, ~30sec with constant motivation and education. He required  mod A for repositioning in chair at end of session.     Ther-ex  Patient seated:   LAQ 2x10 each with mod VCs to increase AROM for better muscle activation and strengthening in each LE. Patient able to achieve partial ROM on LLE due to weakness AAROM for full range x10; Seated reaches anteriorly 2x10 with tactile/visual cues Seated marches alternating LE x 15 each; Seated clams with manual resistance from therapist x 15;     Pt educated throughout session about proper posture and technique with exercises. Improved exercise technique, movement at target joints, use of target muscles after min to mod verbal, visual, tactile cues.      Pt response/clinical impression: The patient reported fatigue at end of session but overall in good spirits. The patient continued to demonstrate motivation throughout session but did fatigue quickly with standing tolerance. Patient with improved ability to perform transfers with decreased PT assistance. The patient would benefit from further skilled PT intervention to continue to progress towards goals.     PT Education - 01/29/19 1303    Education Details  transfers, exercise, positioning, HEP    Person(s) Educated  Patient    Methods  Explanation    Comprehension  Verbalized understanding;Returned demonstration;Tactile cues required;Need further instruction       PT Short Term Goals - 01/13/19 1721      PT SHORT TERM GOAL #1   Title  Be independent with initial home exercise program for self-management of symptoms.    Baseline  HEP to be given at second session; advice to practice unsupported sitting at home (06/19/2018); 07/08/18: Performing at home, 6/29: needs assistance but is doing exercise program regularly; 08/22/18: adherent;    Time  2    Period  Weeks    Status  Achieved    Target Date  06/26/18        PT Long Term Goals - 01/13/19 1721      PT LONG TERM GOAL #1   Title  Patient will complete all bed mobility with min A to improve  functional independence for getting in and out of bed and adjusting in bed.     Baseline  max A - total A reported by family (06/19/2018); 07/08/18: still requires maxA, dtr reports improvement in rolling since starting therapy; 6/23: min A to supervision in clinic; not doing at home right; 8/5: Pt's daughter states that his rolling has improved, but still has difficulty with all other bed mobility; 10/23/18: pts daughter states that she is doing about 35% of the work if he is in proper position, he does uses the bed rails to help roll;  using Hoyer for coming to sit EOB; 10/12: pts daughter states that she is doing  about 30% of the work if he is in proper position, he does uses the bed rails to help roll, but it is improving;  using Hoyer for coming to sit EOB, 10/29: min A for sit to sidelying, rolling supervision, able to transition sidelying to sit with HHA;    Time  12    Period  Weeks    Status  Achieved      PT LONG TERM GOAL #2   Title  Patient will complete sit <> stand transfer chair to chair with Min A and LRAD to improve functional independence for household and community mobility.     Baseline  max A +2 STS at W/C (06/12/2018); max A +2-3 STS or stand pivot transfer, able to complete more reps (06/19/2018); 07/08/18: not safe to attempt at this time; 09/11/18: requires maxA+1 or modA+2 for STSs and squat pivot transfer; 10/23/18: requires maxA+1 or modA+2 for STSs and squat pivot transfer; 10/12: requires maxA+1 or modA+2 for STSs and squat pivot transfer; 10/29: patient able to complete sit<>Stand transfer min A +1 with parallel bars and RW, although inconsistent, mod A for stand pivot; 01/13/19: Pt able to perform sit to stand with parallel bars and RW with minA+1 and a few times with CGA only, still requires modA+1 for stand pivot transfer.    Time  12    Period  Weeks    Status  Partially Met    Target Date  02/27/19      PT LONG TERM GOAL #3   Title  Patient will navigate power w/c with min A x  100 feet to improve mobility for household and short community distances.    Baseline  required total A (06/12/2018); able to roll 20 feet with min A (06/20/2018); 07/08/18: Pt can go approximately 10' without assist per family;  10/23/2018: pt's daughter states that his power WC mobility has become 100% better with the new hand control, slowed speed, and with practice; 10/12: pt's daughter reports that he can perform household WC mobility and limited community ambulation with minA    Time  12    Period  Weeks    Status  Achieved      PT LONG TERM GOAL #4   Title  Patient will complete car transfers W/C to family SUV with min A using LRAD to improve his abilty to participate in community activities.     Baseline  unable to complete car transfers at all (06/12/2018); unable to complete car transfers at all (06/19/2018).; 07/08/18: Unable to perform at this time; 09/11/18: unable to perform at this time; 10/23/2018: unable to complete car transfers at all; 10/12:  unable to complete car transfers at all, 10/29: unable; 01/13/19: unable;    Time  12    Period  Weeks    Status  On-going    Target Date  02/27/19      PT LONG TERM GOAL #5   Title  Patient will ambulate at least 150 feet with mod I using LRAD to improve mobility for household and community distances.     Baseline  unable to take steps (06/12/2018); able to shift R leg forward and back with max A and RW at edge of plinth (06/19/2018); 07/08/18: unable to ambulate at this time. 09/11/18: unable to perform at this time; 10/23/2018: unable to perform at this time; 10/12: unable to perform at this time, 10/29: unable at this time; 01/13/19: unable at this time    Time  12  Period  Weeks    Status  On-going    Target Date  02/27/19      PT LONG TERM GOAL #6   Title  Patient will be able to safety navigate up/down ramp with power chair, exhibiting good safety awareness, mod I to safely enter/exit his home.    Baseline  unable to complet stairs (06/12/2018);  unable to complete stairs (06/19/2018); 07/08/18: unable to attempt at this time; 09/11/18: unsafe to attempt at this time; 10/23/2018: unable to complet stairs (06/12/2018); unable to complete stairs (06/19/2018); 07/08/18: unable to attempt at this time; 09/11/18: unsafe to attempt at this time; 10/23/18: unsafe to attempt at this time, 10/29: requires superivison and occasional min A for safety; 01/13/19: unchanged from last update    Time  12    Period  Weeks    Status  On-going    Target Date  02/27/19      PT LONG TERM GOAL #7   Title  Patient will increase functional reach test to >15 inches in sitting to exhibit improved sitting balance and positioning and reduce fall risk;    Baseline  07/30/18: 12 inches; 09/11/18: 10-12 inches; 10/23/18: 12-13 inch; 10/12: 14.5", 10/29: 15.5 inch    Time  4    Period  Weeks    Status  Achieved      PT LONG TERM GOAL #8   Title  Patient will increase LLE quad strength to 3+/5 to improve functional strength for standing and mobility;    Baseline  10/28: 2+/5; 01/13/19: 2+/5    Time  12    Period  Weeks    Status  On-going    Target Date  02/27/19            Plan - 01/29/19 1454    Clinical Impression Statement  The patient reported fatigue at end of session but overall in good spirits. The patient continued to demonstrate motivation throughout session but did fatigue quickly with standing tolerance. Patient with improved ability to perform transfers with decreased PT assistance. The patient would benefit from further skilled PT intervention to continue to progress towards goals.    Personal Factors and Comorbidities  Age;Comorbidity 3+    Comorbidities  Relevant past medical history and comorbidities include long term steroid use for lupus, CKD, chronic osteomyelitis, cervical spine stenosis, BPH, APS, alcohol abuse, peripheral artery disease, depression, GERD, obstructive sleep apnea, HTN, IBS, IgA deficiency, lumbar radiculopathy, RA, Stroke, toxic maculopathy  in both eyes, systemic lupus erythematosus related syndrome, cardiac catheterization, carotid endarterectomy, cervical laminectomy, coronary angioplasty, Fusion C5-C7, knee arthroplasty, lumbar surgery.    Examination-Activity Limitations  Bed Mobility;Bend;Sit;Toileting;Stand;Stairs;Lift;Transfers;Squat;Locomotion Level;Carry;Dressing;Hygiene/Grooming;Continence    Examination-Participation Restrictions  Yard Work;Interpersonal Relationship;Community Activity    Stability/Clinical Decision Making  Evolving/Moderate complexity    Rehab Potential  Fair    PT Frequency  3x / week    PT Duration  12 weeks    PT Treatment/Interventions  ADLs/Self Care Home Management;Electrical Stimulation;Moist Heat;Cryotherapy;Gait training;Stair training;Functional mobility training;Therapeutic exercise;Balance training;Neuromuscular re-education;Cognitive remediation;Patient/family education;Orthotic Fit/Training;Wheelchair mobility training;Manual techniques;Passive range of motion;Energy conservation;Joint Manipulations    PT Next Visit Plan  Work on strength, sitting/standing balance, functional activities such as rolling, bed mobility, transfers; caregiver training    PT Home Exercise Plan  handout provided via Beth during previous sessions    Consulted and Agree with Plan of Care  Patient;Family member/caregiver    Family Member Consulted  daughter [name]       Patient will benefit from skilled therapeutic  intervention in order to improve the following deficits and impairments:  Abnormal gait, Decreased activity tolerance, Decreased cognition, Decreased endurance, Decreased knowledge of use of DME, Decreased range of motion, Decreased skin integrity, Decreased strength, Impaired perceived functional ability, Impaired sensation, Impaired UE functional use, Improper body mechanics, Pain, Cardiopulmonary status limiting activity, Decreased balance, Decreased coordination, Decreased mobility, Difficulty walking,  Impaired tone, Postural dysfunction  Visit Diagnosis: Muscle weakness (generalized)  Other lack of coordination  Cognitive communication deficit  Vision impairment  Difficulty in walking, not elsewhere classified  Feeding difficulties  Repeated falls  Hemiplegia and hemiparesis following cerebral infarction affecting left non-dominant side [facility])     Problem List Patient Active Problem List   Diagnosis Date Noted  . Sepsis ([place]) 10/18/2018    Lieutenant Diego PT, DPT 3:09 PM,01/29/19   [place] MAIN [facility] SERVICES [address] [place], [place], [address] Phone: [phone]   Fax:  [phone]  Name: Kenneth Summers MRN: [id number] Date of Birth: 05-27-1945

## 2019-01-30 ENCOUNTER — Ambulatory Visit: Payer: Medicare Other | Admitting: Physical Therapy

## 2019-02-03 ENCOUNTER — Ambulatory Visit: Payer: Medicare Other | Admitting: Physical Therapy

## 2019-02-03 ENCOUNTER — Other Ambulatory Visit: Payer: Self-pay

## 2019-02-03 ENCOUNTER — Ambulatory Visit: Payer: Medicare Other | Admitting: Occupational Therapy

## 2019-02-03 ENCOUNTER — Encounter: Payer: Self-pay | Admitting: Occupational Therapy

## 2019-02-03 ENCOUNTER — Encounter: Payer: Self-pay | Admitting: Physical Therapy

## 2019-02-03 DIAGNOSIS — R278 Other lack of coordination: Secondary | ICD-10-CM

## 2019-02-03 DIAGNOSIS — M6281 Muscle weakness (generalized): Secondary | ICD-10-CM | POA: Diagnosis not present

## 2019-02-03 DIAGNOSIS — R262 Difficulty in walking, not elsewhere classified: Secondary | ICD-10-CM

## 2019-02-03 NOTE — Therapy (Signed)
[place] MAIN [facility] SERVICES [address] [place], [place], [address] Phone: [phone]   Fax:  [phone]  Occupational Therapy Treatment  Patient Details  Name: Kenneth Summers MRN: [id number] Date of Birth: 05-21-1945 No data recorded  Encounter Date: 02/03/2019  OT End of Session - 02/03/19 1622    Visit Number  82    Number of Visits  108    Date for OT Re-Evaluation  04/07/19    OT Start Time  1350    OT Stop Time  1430    OT Time Calculation (min)  40 min    Activity Tolerance  Patient tolerated treatment well;Patient limited by fatigue    Behavior During Therapy  [facility] for tasks assessed/performed       Past Medical History:  Diagnosis Date  . Alcohol abuse   . APS (antiphospholipid syndrome) ([place])   . BPH (benign prostatic hyperplasia)   . CAD (coronary artery disease)   . Cellulitis   . Cervical spinal stenosis   . Chronic osteomyelitis ([place])   . CKD (chronic kidney disease)   . CKD (chronic kidney disease)   . Clostridium difficile diarrhea   . Depression   . Diplopia   . Fatigue   . GERD (gastroesophageal reflux disease)   . Hyperlipemia   . Hypertension   . Hypovitaminosis D   . IBS (irritable bowel syndrome)   . IgA deficiency ([place])   . Insomnia   . Left lumbosacral radiculopathy   . Moderate obstructive sleep apnea   . Osteomyelitis of foot ([place])   . PAD (peripheral artery disease) ([place])   . Pruritus   . RA (rheumatoid arthritis) ([place])   . Radiculopathy   . Restless legs syndrome   . RLS (restless legs syndrome)   . SLE (systemic lupus erythematosus related syndrome) ([place])   . Stroke ([place])   . Toxic maculopathy of both eyes     Past Surgical History:  Procedure Laterality Date  . CARDIAC CATHETERIZATION    . CAROTID ENDARTERECTOMY    . CERVICAL LAMINECTOMY    . CORONARY ANGIOPLASTY    . FRACTURE SURGERY    . fusion C5-6-7    . HERNIA REPAIR    . KNEE ARTHROSCOPY    . TONSILLECTOMY       There were no vitals filed for this visit.  Subjective Assessment - 02/03/19 1621    Subjective   Pt. reports having a nice Christmas    Patient is accompanied by:  Family member    Pertinent History  Pt. is a 73 y.o. 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 Pt. PMHx includes: Multiple Falls, Lupus, DJD, Renal Abscess, CVA. Pt. resides with his wife. Pt.'s wife and daughter assist with ADLs. Pt. has caregivers in  for 2 hours a day, 6 days a week. Pt. received Rehab services in acute care, at SNF for STR, and [place] services. Pt. is retired from [employer] for [employer] and [employer].    Patient Stated Goals  To regain use of his left UE, and do more for himself.    Currently in Pain?  Yes      OT TREATMENT    Neuro muscular re-education:  Pt. worked on left [facility] skills manipulating nuts, and bolts on a bolt board. Pt. worked on screwing, and unscrewing nuts, and bolts of varying sizes, and challenging progressively smaller items with cues. Pt. Required increased cues to  use his left hand. Pt. Was able to screw, and unscrew the nuts, and bolts with his vision occluded, however continues to work on connecting the nuts to the bolts with his vision occluded.   Selfcare:  Pt. worked on UE dressing tasks using hemi-dressing techniques. Pt. requiredincreasedcues forinitiatingposition the sweatshirtwhenpreparing todonn a zip down sweatshirt. Pt. Requiredincreased cues to thread the LUE with the right, assist to bring the jacket around his back, and reposition the jacket to prepare for zipping. Pt. required assist to align the zipper in his hands.  Manual Therapy:  Tolerated soft tissue mobilizations for his left hand with carpal rolls, metacarpal spread stretches to prepare the left hand for engagement in functional tasks.  Pt. Reports no complaints of pain.  Response to Treatment  Pt. required increased verbal cues, and visual demonstration  today to initiate donning his jacket, as he attempts to donn the jacket backwards with the back facing front. Pt. required mod cues, and assist to donn the jacket today with assist to set-up the zipper. Pt. continues to work on improving LUE functioning, and consistency with left sided awareness in order to improve overall ADL, and IADL functioning.                        OT Education - 02/03/19 1622    Education Details  UE functioning    Person(s) Educated  Patient    Methods  Explanation;Demonstration    Comprehension  Returned demonstration;Tactile cues required;Verbalized understanding;Need further instruction          OT Long Term Goals - 01/27/19 1404      OT LONG TERM GOAL #1   Title  Pt. will increase left shoulder flexion AROM by 10 degrees to access cabinet/shelf.    Baseline  Pt. is improving with LUE ROM, and functional reaching.    Time  12    Period  Weeks    Status  On-going    Target Date  04/07/19      OT LONG TERM GOAL #2   Title  Pt. will donn a shirt with Supervision.    Baseline  MinA donning a zip down jacket    Time  12    Period  Weeks    Status  On-going    Target Date  04/07/19      OT LONG TERM GOAL #4   Title  Pt. will improve left grip strength by 10# to be able to open a jar/container.    Time  12    Period  Weeks    Status  On-going    Target Date  04/07/19      OT LONG TERM GOAL #5   Title  Pt. will improve left hand [facility] skills to be able to assist with buttoning/zipping.    Baseline  Pt. has made excellent progress with bilateral [facility] skills. Pt. continues to work on improving bilateral [facility] skills for buttoning/zipping    Time  12    Period  Weeks    Status  On-going    Target Date  04/07/19      OT LONG TERM GOAL #6   Title  Pt. will demonstrate visual conmpensatory strategies during 100% of the time during ADLs    Baseline  Pt. requires cues at times for left sides awareness.    Time  12    Period  Weeks     Status  On-going    Target Date  04/07/19  OT LONG TERM GOAL #8   Title  Pt. will accurately identify potential safety hazard using good safety awareness, and judgement 100% for ADLs, and IADLs.    Baseline  Pt. continues to be limited    Time  12    Period  Weeks    Status  On-going    Target Date  04/07/19      OT LONG TERM GOAL  #9   TITLE  Pt. will  navigate his w/c around obstacles with Supervision and 100% accuracy    Baseline  Pt. continues to require consistent cuing for obstacle on the left    Time  12    Period  Weeks    Status  On-going    Target Date  04/07/19      OT LONG TERM GOAL  #11   TITLE  Pt. will increase BUE strength by 23mm grades in preparation for ADL transfers.    Baseline  Limited BUE strength    Time  12    Period  Weeks    Status  On-going    Target Date  04/07/19            Plan - 02/03/19 1622    Clinical Impression Statement Pt. required increased verbal cues, and visual demonstration today to initiate donning his jacket, as he attempts to donn the jacket backwards with the back facing front. Pt. required mod cues, and assist to donn the jacket today with assist to set-up the zipper. Pt. continues to work on improving LUE functioning, and consistency with left sided awareness in order to improve overall ADL, and IADL functioning.    OT Occupational Profile and History  Problem Focused Assessment - Including review of records relating to presenting problem    Occupational performance deficits (Please refer to evaluation for details):  ADL's    Body Structure / Function / Physical Skills  UE functional use;Coordination;FMC;Dexterity;Strength;ROM    Cognitive Skills  Attention;Memory;Emotional;Problem Solve;Safety Awareness    Rehab Potential  Good    Clinical Decision Making  Several treatment options, min-mod task modification necessary    Comorbidities Affecting Occupational Performance:  Presence of comorbidities impacting occupational  performance    Comorbidities impacting occupational performance description:  Phyical, cognitive, visual,  medical comorbidities    Modification or Assistance to Complete Evaluation   No modification of tasks or assist necessary to complete eval    OT Frequency  2x / week    OT Duration  12 weeks    OT Treatment/Interventions  Self-care/ADL training;DME and/or AE instruction;Therapeutic exercise;Therapeutic activities;Moist Heat;Cognitive remediation/compensation;Neuromuscular education;Visual/perceptual remediation/compensation;Coping strategies training;Patient/family education;Passive range of motion;Psychosocial skills training;Energy conservation;Functional Mobility Training    Consulted and Agree with Plan of Care  Patient    Family Member Consulted  Daughter [name]       Patient will benefit from skilled therapeutic intervention in order to improve the following deficits and impairments:   Body Structure / Function / Physical Skills: UE functional use, Coordination, FMC, Dexterity, Strength, ROM Cognitive Skills: Attention, Memory, Emotional, Problem Solve, Safety Awareness     Visit Diagnosis: Muscle weakness (generalized)  Other lack of coordination    Problem List Patient Active Problem List   Diagnosis Date Noted  . Sepsis ([place]) 10/18/2018    Harrel Carina, MS, OTR/L 02/03/2019, 4:34 PM  [place] MAIN [facility] SERVICES [address] [place], [place], [address] Phone: [phone]   Fax:  [phone]  Name: Kenneth Summers MRN: [id number] Date of  Birth: 1945-08-28

## 2019-02-03 NOTE — Therapy (Signed)
[place] MAIN [facility] SERVICES [address] [place], [place], [address] Phone: [phone]   Fax:  [phone]  Physical Therapy Treatment  Patient Details  Name: Kenneth Summers MRN: [id number] Date of Birth: [birth year] Referring Provider (PT): Dr. [name], Guy Begin   Encounter Date: 02/03/2019  PT End of Session - 02/03/19 1259    Visit Number  [id number]    Number of Visits  108    Date for PT Re-Evaluation  02/27/19    Authorization Type  Medicare reporting period starting 09/11/18    Authorization Time Period  goals last assessed 01/13/19    PT Start Time  1300    PT Stop Time  1345    PT Time Calculation (min)  45 min    Equipment Utilized During Treatment  Gait belt    Activity Tolerance  Patient tolerated treatment well    Behavior During Therapy  [facility] for tasks assessed/performed       Past Medical History:  Diagnosis Date  . Alcohol abuse   . APS (antiphospholipid syndrome) ([place])   . BPH (benign prostatic hyperplasia)   . CAD (coronary artery disease)   . Cellulitis   . Cervical spinal stenosis   . Chronic osteomyelitis ([place])   . CKD (chronic kidney disease)   . CKD (chronic kidney disease)   . Clostridium difficile diarrhea   . Depression   . Diplopia   . Fatigue   . GERD (gastroesophageal reflux disease)   . Hyperlipemia   . Hypertension   . Hypovitaminosis D   . IBS (irritable bowel syndrome)   . 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 Insomnia   . Left lumbosacral radiculopathy   . Moderate obstructive sleep apnea   . Osteomyelitis of foot ([place])   . PAD (peripheral artery disease) ([place])   . Pruritus   . RA (rheumatoid arthritis) ([place])   . Radiculopathy   . Restless legs syndrome   . RLS (restless legs syndrome)   . SLE (systemic lupus erythematosus related syndrome) ([place])   . Stroke ([place])   . Toxic maculopathy of both eyes     Past Surgical History:  Procedure Laterality Date  . CARDIAC CATHETERIZATION    . CAROTID  ENDARTERECTOMY    . CERVICAL LAMINECTOMY    . CORONARY ANGIOPLASTY    . FRACTURE SURGERY    . fusion C5-6-7    . HERNIA REPAIR    . KNEE ARTHROSCOPY    . TONSILLECTOMY      There were no vitals filed for this visit.  Subjective Assessment - 02/03/19 1310    Subjective  Patient reports mild fatigue today. He reports increased LUE burning pain in hand when he leans back or extends head; Recommended patient follow up with neurologist regarding possible nerve impingement.    Patient is accompained by:  Family member    Pertinent History  Patient is a 73 y.o. male who presents to outpatient physical therapy with a referral for medical diagnosis of CVA. This patient's chief complaints consist of left hemiplegia and overall deconditioning leading to the following functional deficits: dependent for ADLs, IADLs, unable to transfer to car, dependent transfers at home with hoyer lift, unable to walk or stand without significant assistance, difficulty with W/C navigation..    Limitations  Sitting;Lifting;Standing;House hold activities;Writing;Walking;Other (comment)    Diagnostic tests  Brain MRI 11/01/2017: IMPRESSION: 1. Acute right ACA territory infarct as demonstrated on prior CT imaging. No intracranial hemorrhage or significant mass  effect. 2. 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    Patient Stated Goals  wants to be able to walk again    Currently in Pain?  Yes    Pain Score  7     Pain Location  Hand    Pain Orientation  Left    Pain Descriptors / Indicators  Burning    Pain Type  Acute pain    Pain Onset  1 to 4 weeks ago    Pain Frequency  Intermittent    Aggravating Factors   worse when leaning back/looking up    Pain Relieving Factors  rest    Effect of Pain on Daily Activities  decreased activity tolerance;          TREATMENT: Patient exhibits increased tightness at start of session;   Seated  PT performed passive hamstring stretch 30 sec  hold x2 reps bilaterally; LAQ x5 reps bilaterally; Following hamstring stretch patient able to exhibit improved ROM with less stiffness.  Hip flexion march 2x5 reps bilaterally with mod VCs to avoid posterior lean to improve hip flexor activation;   Transferred sit<>Stand in parallel bars (seat adjusted to 27 inch from floor height) x4 reps with 10 sec initial stand progressing to 1 min each subsequent stand Patient transfers sit<>stand with CGA with cues for forward weight shift and to increase push through BLE.   In standing worked on standing balance with alternate UE lift x5 reps bilaterally x3 sets with min A to challenge standing balance and avoid posterior lean Patient exhibits increased posterior loss of balance with initial lifting of hand but was able to exhibit better weight shift with improved stance control with increased repetition. He does fatigue quickly; Instructed patient to reach back for arm rest prior to sitting for better controlled sit down and safety;   Sit<>Stand from power chair with therapist assist x2 reps with mod A +1 with cues for forward weight shift and to increase hip extension; Patient heavily fatigued with poor trunk control and poor weight shift having difficulty achieving full standing position.   Patient required mod A to scoot back in chair at end of session due to increased fatigue  Response to treatment: Tolerated fair. Patient heavily fatigued at end of session. He required increased cues for proper weight shift and standing position. Patient progressed standing exercise to challenge balance with less rail assist. Patient initially exhibits poor LLE AROM due to stiffness which was improved following LE stretches.                       PT Education - 02/03/19 1258    Education Details  transfer, exercise, positioning, HEP    Person(s) Educated  Patient    Methods  Explanation;Verbal cues    Comprehension  Verbalized  understanding;Returned demonstration;Verbal cues required;Need further instruction       PT Short Term Goals - 01/13/19 1721      PT SHORT TERM GOAL #1   Title  Be independent with initial home exercise program for self-management of symptoms.    Baseline  HEP to be given at second session; advice to practice unsupported sitting at home (06/19/2018); 07/08/18: Performing at home, 6/29: needs assistance but is doing exercise program regularly; 08/22/18: adherent;    Time  2    Period  Weeks    Status  Achieved    Target Date  06/26/18        PT Long  Term Goals - 01/13/19 1721      PT LONG TERM GOAL #1   Title  Patient will complete all bed mobility with min A to improve functional independence for getting in and out of bed and adjusting in bed.     Baseline  max A - total A reported by family (06/19/2018); 07/08/18: still requires maxA, dtr reports improvement in rolling since starting therapy; 6/23: min A to supervision in clinic; not doing at home right; 8/5: Pt's daughter states that his rolling has improved, but still has difficulty with all other bed mobility; 10/23/18: pts daughter states that she is doing about 35% of the work if he is in proper position, he does uses the bed rails to help roll;  using Hoyer for coming to sit EOB; 10/12: pts daughter states that she is doing about 30% of the work if he is in proper position, he does uses the bed rails to help roll, but it is improving;  using Hoyer for coming to sit EOB, 10/29: min A for sit to sidelying, rolling supervision, able to transition sidelying to sit with HHA;    Time  12    Period  Weeks    Status  Achieved      PT LONG TERM GOAL #2   Title  Patient will complete sit <> stand transfer chair to chair with Min A and LRAD to improve functional independence for household and community mobility.     Baseline  max A +2 STS at W/C (06/12/2018); max A +2-3 STS or stand pivot transfer, able to complete more reps (06/19/2018); 07/08/18: not  safe to attempt at this time; 09/11/18: requires maxA+1 or modA+2 for STSs and squat pivot transfer; 10/23/18: requires maxA+1 or modA+2 for STSs and squat pivot transfer; 10/12: requires maxA+1 or modA+2 for STSs and squat pivot transfer; 10/29: patient able to complete sit<>Stand transfer min A +1 with parallel bars and RW, although inconsistent, mod A for stand pivot; 01/13/19: Pt able to perform sit to stand with parallel bars and RW with minA+1 and a few times with CGA only, still requires modA+1 for stand pivot transfer.    Time  12    Period  Weeks    Status  Partially Met    Target Date  02/27/19      PT LONG TERM GOAL #3   Title  Patient will navigate power w/c with min A x 100 feet to improve mobility for household and short community distances.    Baseline  required total A (06/12/2018); able to roll 20 feet with min A (06/20/2018); 07/08/18: Pt can go approximately 10' without assist per family;  10/23/2018: pt's daughter states that his power WC mobility has become 100% better with the new hand control, slowed speed, and with practice; 10/12: pt's daughter reports that he can perform household WC mobility and limited community ambulation with minA    Time  12    Period  Weeks    Status  Achieved      PT LONG TERM GOAL #4   Title  Patient will complete car transfers W/C to family SUV with min A using LRAD to improve his abilty to participate in community activities.     Baseline  unable to complete car transfers at all (06/12/2018); unable to complete car transfers at all (06/19/2018).; 07/08/18: Unable to perform at this time; 09/11/18: unable to perform at this time; 10/23/2018: unable to complete car transfers at all; 10/12:  unable  to complete car transfers at all, 10/29: unable; 01/13/19: unable;    Time  12    Period  Weeks    Status  On-going    Target Date  02/27/19      PT LONG TERM GOAL #5   Title  Patient will ambulate at least 150 feet with mod I using LRAD to improve mobility for  household and community distances.     Baseline  unable to take steps (06/12/2018); able to shift R leg forward and back with max A and RW at edge of plinth (06/19/2018); 07/08/18: unable to ambulate at this time. 09/11/18: unable to perform at this time; 10/23/2018: unable to perform at this time; 10/12: unable to perform at this time, 10/29: unable at this time; 01/13/19: unable at this time    Time  12    Period  Weeks    Status  On-going    Target Date  02/27/19      PT LONG TERM GOAL #6   Title  Patient will be able to safety navigate up/down ramp with power chair, exhibiting good safety awareness, mod I to safely enter/exit his home.    Baseline  unable to complet stairs (06/12/2018); unable to complete stairs (06/19/2018); 07/08/18: unable to attempt at this time; 09/11/18: unsafe to attempt at this time; 10/23/2018: unable to complet stairs (06/12/2018); unable to complete stairs (06/19/2018); 07/08/18: unable to attempt at this time; 09/11/18: unsafe to attempt at this time; 10/23/18: unsafe to attempt at this time, 10/29: requires superivison and occasional min A for safety; 01/13/19: unchanged from last update    Time  12    Period  Weeks    Status  On-going    Target Date  02/27/19      PT LONG TERM GOAL #7   Title  Patient will increase functional reach test to >15 inches in sitting to exhibit improved sitting balance and positioning and reduce fall risk;    Baseline  07/30/18: 12 inches; 09/11/18: 10-12 inches; 10/23/18: 12-13 inch; 10/12: 14.5", 10/29: 15.5 inch    Time  4    Period  Weeks    Status  Achieved      PT LONG TERM GOAL #8   Title  Patient will increase LLE quad strength to 3+/5 to improve functional strength for standing and mobility;    Baseline  10/28: 2+/5; 01/13/19: 2+/5    Time  12    Period  Weeks    Status  On-going    Target Date  02/27/19            Plan - 02/03/19 1453    Clinical Impression Statement  Patient fatigued this session. He required increased cues for proper  positioning and exercise technique. Initially patient exhibits increased stiffness in LLE posterior leg with minimal muscle activation. However following stretches he was able to exhibit partial AROM (approximately 50% against gravity). Patient instructed in sit<>stand transfers, progressed standing exercise to include alternate UE lift to challenge balance/stance control. Patient exhibits increased posterior loss of balance with reduced rail assist. He requires cues for forward weight shift to improve stance control. Patient heavily fatigued at end of session;    Personal Factors and Comorbidities  Age;Comorbidity 3+    Comorbidities  Relevant past medical history and comorbidities include long term steroid use for lupus, CKD, chronic osteomyelitis, cervical spine stenosis, BPH, APS, alcohol abuse, peripheral artery disease, depression, GERD, obstructive sleep apnea, HTN, IBS, IgA deficiency, lumbar radiculopathy, RA, Stroke,  toxic maculopathy in both eyes, systemic lupus erythematosus related syndrome, cardiac catheterization, carotid endarterectomy, cervical laminectomy, coronary angioplasty, Fusion C5-C7, knee arthroplasty, lumbar surgery.    Examination-Activity Limitations  Bed Mobility;Bend;Sit;Toileting;Stand;Stairs;Lift;Transfers;Squat;Locomotion Level;Carry;Dressing;Hygiene/Grooming;Continence    Examination-Participation Restrictions  Yard Work;Interpersonal Relationship;Community Activity    Stability/Clinical Decision Making  Evolving/Moderate complexity    Rehab Potential  Fair    PT Frequency  3x / week    PT Duration  12 weeks    PT Treatment/Interventions  ADLs/Self Care Home Management;Electrical Stimulation;Moist Heat;Cryotherapy;Gait training;Stair training;Functional mobility training;Therapeutic exercise;Balance training;Neuromuscular re-education;Cognitive remediation;Patient/family education;Orthotic Fit/Training;Wheelchair mobility training;Manual techniques;Passive range of  motion;Energy conservation;Joint Manipulations    PT Next Visit Plan  Work on strength, sitting/standing balance, functional activities such as rolling, bed mobility, transfers; caregiver training    PT Home Exercise Plan  handout provided via Beth during previous sessions    Consulted and Agree with Plan of Care  Patient;Family member/caregiver    Family Member Consulted  daughter [name]       Patient will benefit from skilled therapeutic intervention in order to improve the following deficits and impairments:  Abnormal gait, Decreased activity tolerance, Decreased cognition, Decreased endurance, Decreased knowledge of use of DME, Decreased range of motion, Decreased skin integrity, Decreased strength, Impaired perceived functional ability, Impaired sensation, Impaired UE functional use, Improper body mechanics, Pain, Cardiopulmonary status limiting activity, Decreased balance, Decreased coordination, Decreased mobility, Difficulty walking, Impaired tone, Postural dysfunction  Visit Diagnosis: Muscle weakness (generalized)  Other lack of coordination  Difficulty in walking, not elsewhere classified     Problem List Patient Active Problem List   Diagnosis Date Noted  . Sepsis ([place]) 10/18/2018    Shane Melby PT,DPT 02/03/2019, 2:56 PM  [place] MAIN [facility] SERVICES [address] [place], [place], [address] Phone: [phone]   Fax:  [phone]  Name: Kenneth Summers MRN: [id number] Date of Birth: Dec 19, 1945

## 2019-02-05 ENCOUNTER — Other Ambulatory Visit: Payer: Self-pay

## 2019-02-05 ENCOUNTER — Ambulatory Visit: Payer: Medicare Other | Admitting: Occupational Therapy

## 2019-02-05 ENCOUNTER — Ambulatory Visit: Payer: Medicare Other

## 2019-02-05 DIAGNOSIS — M6281 Muscle weakness (generalized): Secondary | ICD-10-CM

## 2019-02-05 DIAGNOSIS — R262 Difficulty in walking, not elsewhere classified: Secondary | ICD-10-CM

## 2019-02-05 NOTE — Therapy (Signed)
[place] MAIN [facility] SERVICES [address] [place], [place], [address] Phone: [phone]   Fax:  [phone]  Physical Therapy Treatment  Patient Details  Name: Kenneth Summers MRN: [id number] Date of Birth: 1945/09/22 Referring Provider (PT): Dr. [name], Guy Begin   Encounter Date: 02/05/2019  PT End of Session - 02/05/19 1313    Visit Number  9    Number of Visits  108    Date for PT Re-Evaluation  02/27/19    Authorization Type  Medicare reporting period starting 09/11/18    Authorization Time Period  goals last assessed 01/13/19    PT Start Time  1307    PT Stop Time  1345    PT Time Calculation (min)  38 min    Equipment Utilized During Treatment  Gait belt    Activity Tolerance  Patient tolerated treatment well    Behavior During Therapy  [facility] for tasks assessed/performed       Past Medical History:  Diagnosis Date  . Alcohol abuse   . APS (antiphospholipid syndrome) ([place])   . BPH (benign prostatic hyperplasia)   . CAD (coronary artery disease)   . Cellulitis   . Cervical spinal stenosis   . Chronic osteomyelitis ([place])   . CKD (chronic kidney disease)   . CKD (chronic kidney disease)   . Clostridium difficile diarrhea   . Depression   . Diplopia   . Fatigue   . GERD (gastroesophageal reflux disease)   . Hyperlipemia   . Hypertension   . Hypovitaminosis D   . IBS (irritable bowel syndrome)   . 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 Insomnia   . Left lumbosacral radiculopathy   . Moderate obstructive sleep apnea   . Osteomyelitis of foot ([place])   . PAD (peripheral artery disease) ([place])   . Pruritus   . RA (rheumatoid arthritis) ([place])   . Radiculopathy   . Restless legs syndrome   . RLS (restless legs syndrome)   . SLE (systemic lupus erythematosus related syndrome) ([place])   . Stroke ([place])   . Toxic maculopathy of both eyes     Past Surgical History:  Procedure Laterality Date  . CARDIAC CATHETERIZATION    . CAROTID  ENDARTERECTOMY    . CERVICAL LAMINECTOMY    . CORONARY ANGIOPLASTY    . FRACTURE SURGERY    . fusion C5-6-7    . HERNIA REPAIR    . KNEE ARTHROSCOPY    . TONSILLECTOMY      There were no vitals filed for this visit.  Subjective Assessment - 02/05/19 1311    Subjective  Patient reports he is doing alright today. He continues to report LUE burning pain in hand when he leans back or extends head. He states that when he raises his arm up the pain stops. Otherwise no specific questions or concerns at start of session.    Patient is accompained by:  Family member    Pertinent History  Patient is a 73 y.o. male who presents to outpatient physical therapy with a referral for medical diagnosis of CVA. This patient's chief complaints consist of left hemiplegia and overall deconditioning leading to the following functional deficits: dependent for ADLs, IADLs, unable to transfer to car, dependent transfers at home with hoyer lift, unable to walk or stand without significant assistance, difficulty with W/C navigation..    Limitations  Sitting;Lifting;Standing;House hold activities;Writing;Walking;Other (comment)    Diagnostic tests  Brain MRI 11/01/2017: IMPRESSION: 1. Acute right  ACA territory infarct as demonstrated on prior CT imaging. No intracranial hemorrhage or significant mass effect. 2. 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    Patient Stated Goals  wants to be able to walk again    Currently in Pain?  No/denies   No pain at rest at start of session        TREATMENT   Therapeutic Activity Patient sitting in power chair with power chair elevated and tilted forward for better positioning:(seat height 27 inches from floor) Sit<>stand transfersat // bars x 3reps; He was able to transfer withminA+1 for first rep and modA+1 for additional 2 reps. He did not require therapist assist to block LLE kneebut did require assistance for forward weight shift,  proper positioning, and cues to push with RUE from wheelchair. Patient able to stand for approximately 90 sec first repetition, 30s during second repetition, and 75s during third repetition. Instructed patient to reach back for chair to improve safety awareness.Attempted one additional stand at end of session following exercises however pt is too fatigued to come to standing;  He required minA for repositioning in chair at end of session;   Ther-ex  Patient seated:  Alternating LAQ 2 x 10 reps each with mod VCs to increase AROM for better muscle activation and strengthening in each LE. Patient able to achieve partial ROM on LLE due to weakness; Seated marches alternating LE 2 x 10 each; Seated clams with manual resistance from therapist 2 x 10; Seated adductor squeeze with manual resistance from therapist 2 x 10; Seated HS curls with manual resistance from therapist 2 x 10;   Pt educated throughout session about proper posture and technique with exercises. Improved exercise technique, movement at target joints, use of target muscles after min to mod verbal, visual, tactile cues.    Patientdemonstrates good motivation throughout today's session.He is able to complete sit to stand transfers as well as seated exercises during session today. Patient able to stand for approximately 90 sec first repetition, 30s during second repetition, and 75s during third repetition. Attempted one additional stand at end of session following exercises however pt is too fatigued to come to standing. With seated exercises he is demonstrating improved LLE strength especially hip flexion. Pt will benefit from continued skilled PT services in order to increase strength and functional mobilityin order to decrease caregiver burden, improve his ability to perform ADLs, and to improve his QOL.                        PT Short Term Goals - 01/13/19 1721      PT SHORT TERM GOAL #1   Title  Be  independent with initial home exercise program for self-management of symptoms.    Baseline  HEP to be given at second session; advice to practice unsupported sitting at home (06/19/2018); 07/08/18: Performing at home, 6/29: needs assistance but is doing exercise program regularly; 08/22/18: adherent;    Time  2    Period  Weeks    Status  Achieved    Target Date  06/26/18        PT Long Term Goals - 01/13/19 1721      PT LONG TERM GOAL #1   Title  Patient will complete all bed mobility with min A to improve functional independence for getting in and out of bed and adjusting in bed.     Baseline  max A - total A reported  by family (06/19/2018); 07/08/18: still requires maxA, dtr reports improvement in rolling since starting therapy; 6/23: min A to supervision in clinic; not doing at home right; 8/5: Pt's daughter states that his rolling has improved, but still has difficulty with all other bed mobility; 10/23/18: pts daughter states that she is doing about 35% of the work if he is in proper position, he does uses the bed rails to help roll;  using Hoyer for coming to sit EOB; 10/12: pts daughter states that she is doing about 30% of the work if he is in proper position, he does uses the bed rails to help roll, but it is improving;  using Hoyer for coming to sit EOB, 10/29: min A for sit to sidelying, rolling supervision, able to transition sidelying to sit with HHA;    Time  12    Period  Weeks    Status  Achieved      PT LONG TERM GOAL #2   Title  Patient will complete sit <> stand transfer chair to chair with Min A and LRAD to improve functional independence for household and community mobility.     Baseline  max A +2 STS at W/C (06/12/2018); max A +2-3 STS or stand pivot transfer, able to complete more reps (06/19/2018); 07/08/18: not safe to attempt at this time; 09/11/18: requires maxA+1 or modA+2 for STSs and squat pivot transfer; 10/23/18: requires maxA+1 or modA+2 for STSs and squat pivot transfer;  10/12: requires maxA+1 or modA+2 for STSs and squat pivot transfer; 10/29: patient able to complete sit<>Stand transfer min A +1 with parallel bars and RW, although inconsistent, mod A for stand pivot; 01/13/19: Pt able to perform sit to stand with parallel bars and RW with minA+1 and a few times with CGA only, still requires modA+1 for stand pivot transfer.    Time  12    Period  Weeks    Status  Partially Met    Target Date  02/27/19      PT LONG TERM GOAL #3   Title  Patient will navigate power w/c with min A x 100 feet to improve mobility for household and short community distances.    Baseline  required total A (06/12/2018); able to roll 20 feet with min A (06/20/2018); 07/08/18: Pt can go approximately 10' without assist per family;  10/23/2018: pt's daughter states that his power WC mobility has become 100% better with the new hand control, slowed speed, and with practice; 10/12: pt's daughter reports that he can perform household WC mobility and limited community ambulation with minA    Time  12    Period  Weeks    Status  Achieved      PT LONG TERM GOAL #4   Title  Patient will complete car transfers W/C to family SUV with min A using LRAD to improve his abilty to participate in community activities.     Baseline  unable to complete car transfers at all (06/12/2018); unable to complete car transfers at all (06/19/2018).; 07/08/18: Unable to perform at this time; 09/11/18: unable to perform at this time; 10/23/2018: unable to complete car transfers at all; 10/12:  unable to complete car transfers at all, 10/29: unable; 01/13/19: unable;    Time  12    Period  Weeks    Status  On-going    Target Date  02/27/19      PT LONG TERM GOAL #5   Title  Patient will ambulate at least 150  feet with mod I using LRAD to improve mobility for household and community distances.     Baseline  unable to take steps (06/12/2018); able to shift R leg forward and back with max A and RW at edge of plinth (06/19/2018); 07/08/18:  unable to ambulate at this time. 09/11/18: unable to perform at this time; 10/23/2018: unable to perform at this time; 10/12: unable to perform at this time, 10/29: unable at this time; 01/13/19: unable at this time    Time  12    Period  Weeks    Status  On-going    Target Date  02/27/19      PT LONG TERM GOAL #6   Title  Patient will be able to safety navigate up/down ramp with power chair, exhibiting good safety awareness, mod I to safely enter/exit his home.    Baseline  unable to complet stairs (06/12/2018); unable to complete stairs (06/19/2018); 07/08/18: unable to attempt at this time; 09/11/18: unsafe to attempt at this time; 10/23/2018: unable to complet stairs (06/12/2018); unable to complete stairs (06/19/2018); 07/08/18: unable to attempt at this time; 09/11/18: unsafe to attempt at this time; 10/23/18: unsafe to attempt at this time, 10/29: requires superivison and occasional min A for safety; 01/13/19: unchanged from last update    Time  12    Period  Weeks    Status  On-going    Target Date  02/27/19      PT LONG TERM GOAL #7   Title  Patient will increase functional reach test to >15 inches in sitting to exhibit improved sitting balance and positioning and reduce fall risk;    Baseline  07/30/18: 12 inches; 09/11/18: 10-12 inches; 10/23/18: 12-13 inch; 10/12: 14.5", 10/29: 15.5 inch    Time  4    Period  Weeks    Status  Achieved      PT LONG TERM GOAL #8   Title  Patient will increase LLE quad strength to 3+/5 to improve functional strength for standing and mobility;    Baseline  10/28: 2+/5; 01/13/19: 2+/5    Time  12    Period  Weeks    Status  On-going    Target Date  02/27/19            Plan - 02/05/19 1314    Clinical Impression Statement  Patient demonstrates good motivation throughout today's session. He is able to complete sit to stand transfers as well as seated exercises during session today. Patient able to stand for approximately 90 sec first repetition, 30s during second  repetition, and 75s during third repetition. Attempted one additional stand at end of session following exercises however pt is too fatigued to come to standing. With seated exercises he is demonstrating improved LLE strength especially hip flexion. Pt will benefit from continued skilled PT services in order to increase strength and functional mobility in order to decrease caregiver burden, improve his ability to perform ADLs, and to improve his QOL.    Personal Factors and Comorbidities  Age;Comorbidity 3+    Comorbidities  Relevant past medical history and comorbidities include long term steroid use for lupus, CKD, chronic osteomyelitis, cervical spine stenosis, BPH, APS, alcohol abuse, peripheral artery disease, depression, GERD, obstructive sleep apnea, HTN, IBS, IgA deficiency, lumbar radiculopathy, RA, Stroke, toxic maculopathy in both eyes, systemic lupus erythematosus related syndrome, cardiac catheterization, carotid endarterectomy, cervical laminectomy, coronary angioplasty, Fusion C5-C7, knee arthroplasty, lumbar surgery.    Examination-Activity Limitations  Bed Mobility;Bend;Sit;Toileting;Stand;Stairs;Lift;Transfers;Squat;Locomotion Level;Carry;Dressing;Hygiene/Grooming;Continence  Examination-Participation Restrictions  Yard Work;Interpersonal Relationship;Community Activity    Stability/Clinical Decision Making  Evolving/Moderate complexity    Rehab Potential  Fair    PT Frequency  3x / week    PT Duration  12 weeks    PT Treatment/Interventions  ADLs/Self Care Home Management;Electrical Stimulation;Moist Heat;Cryotherapy;Gait training;Stair training;Functional mobility training;Therapeutic exercise;Balance training;Neuromuscular re-education;Cognitive remediation;Patient/family education;Orthotic Fit/Training;Wheelchair mobility training;Manual techniques;Passive range of motion;Energy conservation;Joint Manipulations    PT Next Visit Plan  Work on strength, sitting/standing balance,  functional activities such as rolling, bed mobility, transfers; caregiver training    PT Home Exercise Plan  handout provided via Beth during previous sessions    Consulted and Agree with Plan of Care  Patient;Family member/caregiver    Family Member Consulted  daughter [name]       Patient will benefit from skilled therapeutic intervention in order to improve the following deficits and impairments:  Abnormal gait, Decreased activity tolerance, Decreased cognition, Decreased endurance, Decreased knowledge of use of DME, Decreased range of motion, Decreased skin integrity, Decreased strength, Impaired perceived functional ability, Impaired sensation, Impaired UE functional use, Improper body mechanics, Pain, Cardiopulmonary status limiting activity, Decreased balance, Decreased coordination, Decreased mobility, Difficulty walking, Impaired tone, Postural dysfunction  Visit Diagnosis: Muscle weakness (generalized)  Difficulty in walking, not elsewhere classified     Problem List Patient Active Problem List   Diagnosis Date Noted  . Sepsis ([place]) 10/18/2018   Phillips Grout PT, DPT, GCS  Manasa Spease 02/05/2019, 4:40 PM  [place] MAIN [facility] SERVICES [address] [place], [place], [address] Phone: [phone]   Fax:  [phone]  Name: Kenneth Summers MRN: [id number] Date of Birth: 03-Jul-1945

## 2019-02-06 ENCOUNTER — Other Ambulatory Visit: Payer: Self-pay

## 2019-02-06 ENCOUNTER — Ambulatory Visit: Payer: Medicare Other | Admitting: Occupational Therapy

## 2019-02-06 ENCOUNTER — Encounter: Payer: Self-pay | Admitting: Occupational Therapy

## 2019-02-06 ENCOUNTER — Ambulatory Visit: Payer: Medicare Other

## 2019-02-06 DIAGNOSIS — R278 Other lack of coordination: Secondary | ICD-10-CM

## 2019-02-06 DIAGNOSIS — R262 Difficulty in walking, not elsewhere classified: Secondary | ICD-10-CM

## 2019-02-06 DIAGNOSIS — M6281 Muscle weakness (generalized): Secondary | ICD-10-CM | POA: Diagnosis not present

## 2019-02-06 NOTE — Therapy (Signed)
[place] MAIN [facility] SERVICES [address] [place], [place], [address] Phone: [phone]   Fax:  [phone]  Occupational Therapy Treatment  Patient Details  Name: Kenneth Summers MRN: [id number] Date of Birth: Oct 08, 1945 No data recorded  Encounter Date: 02/06/2019  OT End of Session - 02/06/19 1438    Visit Number  56    Number of Visits  108    Date for OT Re-Evaluation  04/07/19    Authorization Type  Progress report period starting 12/26/2018    OT Start Time  1347    OT Stop Time  1428    OT Time Calculation (min)  41 min    Activity Tolerance  Patient tolerated treatment well;Patient limited by fatigue    Behavior During Therapy  [facility] for tasks assessed/performed       Past Medical History:  Diagnosis Date  . Alcohol abuse   . APS (antiphospholipid syndrome) ([place])   . BPH (benign prostatic hyperplasia)   . CAD (coronary artery disease)   . Cellulitis   . Cervical spinal stenosis   . Chronic osteomyelitis ([place])   . CKD (chronic kidney disease)   . CKD (chronic kidney disease)   . Clostridium difficile diarrhea   . Depression   . Diplopia   . Fatigue   . GERD (gastroesophageal reflux disease)   . Hyperlipemia   . Hypertension   . Hypovitaminosis D   . IBS (irritable bowel syndrome)   . IgA deficiency ([place])   . Insomnia   . Left lumbosacral radiculopathy   . Moderate obstructive sleep apnea   . Osteomyelitis of foot ([place])   . PAD (peripheral artery disease) ([place])   . Pruritus   . RA (rheumatoid arthritis) ([place])   . Radiculopathy   . Restless legs syndrome   . RLS (restless legs syndrome)   . SLE (systemic lupus erythematosus related syndrome) ([place])   . Stroke ([place])   . Toxic maculopathy of both eyes     Past Surgical History:  Procedure Laterality Date  . CARDIAC CATHETERIZATION    . CAROTID ENDARTERECTOMY    . CERVICAL LAMINECTOMY    . CORONARY ANGIOPLASTY    . FRACTURE SURGERY    . fusion C5-6-7     . HERNIA REPAIR    . KNEE ARTHROSCOPY    . TONSILLECTOMY      There were no vitals filed for this visit.  Subjective Assessment - 02/06/19 1438    Subjective   Pt. reports that his wife had a fall with a bump to the head yesterday, wents to the hospital, and has now returned home.    Patient is accompanied by:  Family member    Pertinent History  Pt. is a 73 y.o. 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 Pt. PMHx includes: Multiple Falls, Lupus, DJD, Renal Abscess, CVA. Pt. resides with his wife. Pt.'s wife and daughter assist with ADLs. Pt. has caregivers in  for 2 hours a day, 6 days a week. Pt. received Rehab services in acute care, at SNF for STR, and [place] services. Pt. is retired from [employer] for [employer] and [employer].    Currently in Pain?  No/denies      OT TREATMENT    Selfcare:  Pt. worked on UE dressing tasks using hemi-dressing techniques. Pt. requiredlesscues forinitiatingposition of the sweatshirtwhenpreparing todonn a zip down sweatshirt over the LUE. Pt. required cues to thread the LUE  with the right, assist to bring the jacket around his back, and reposition the jacket to prepare for zipping.No assist needed to align the zipper in his hands x's3 reps. Pt. worked on [employer], and coordination skills while handling, and using tools. Pt. worked on using crescent wrenches, and screw drivers to remove screws, and bolts of varying sizes. Pt. Required cues to engage his left hand during the task, and required cues to reposition to bolt board.   Response to Treatment  Pt. was more fatigued today, and required a reclining rest break. Pt. has improved with zipping his sweatshirt zipper 3/3 times consistently. Pt. continues to work on improving LUE functioning in order to improve left sided awareness, and increase LUE engagement in ADLs, and IADL tasks to maximize independence, and decrease the need for caregiver  support.                          OT Education - 02/06/19 1438    Education Details  UE functioning    Person(s) Educated  Patient    Methods  Explanation;Demonstration    Comprehension  Returned demonstration;Tactile cues required;Verbalized understanding;Need further instruction          OT Long Term Goals - 01/27/19 1404      OT LONG TERM GOAL #1   Title  Pt. will increase left shoulder flexion AROM by 10 degrees to access cabinet/shelf.    Baseline  Pt. is improving with LUE ROM, and functional reaching.    Time  12    Period  Weeks    Status  On-going    Target Date  04/07/19      OT LONG TERM GOAL #2   Title  Pt. will donn a shirt with Supervision.    Baseline  MinA donning a zip down jacket    Time  12    Period  Weeks    Status  On-going    Target Date  04/07/19      OT LONG TERM GOAL #4   Title  Pt. will improve left grip strength by 10# to be able to open a jar/container.    Time  12    Period  Weeks    Status  On-going    Target Date  04/07/19      OT LONG TERM GOAL #5   Title  Pt. will improve left hand [facility] skills to be able to assist with buttoning/zipping.    Baseline  Pt. has made excellent progress with bilateral [facility] skills. Pt. continues to work on improving bilateral [facility] skills for buttoning/zipping    Time  12    Period  Weeks    Status  On-going    Target Date  04/07/19      OT LONG TERM GOAL #6   Title  Pt. will demonstrate visual conmpensatory strategies during 100% of the time during ADLs    Baseline  Pt. requires cues at times for left sides awareness.    Time  12    Period  Weeks    Status  On-going    Target Date  04/07/19      OT LONG TERM GOAL #8   Title  Pt. will accurately identify potential safety hazard using good safety awareness, and judgement 100% for ADLs, and IADLs.    Baseline  Pt. continues to be limited    Time  12    Period  Weeks  Status  On-going    Target Date  04/07/19      OT LONG  TERM GOAL  #9   TITLE  Pt. will  navigate his w/c around obstacles with Supervision and 100% accuracy    Baseline  Pt. continues to require consistent cuing for obstacle on the left    Time  12    Period  Weeks    Status  On-going    Target Date  04/07/19      OT LONG TERM GOAL  #11   TITLE  Pt. will increase BUE strength by 68mm grades in preparation for ADL transfers.    Baseline  Limited BUE strength    Time  12    Period  Weeks    Status  On-going    Target Date  04/07/19            Plan - 02/06/19 1439    Clinical Impression Statement Pt. was more fatigued today, and required a reclining rest break. Pt. has improved with zipping his sweatshirt zipper 3/3 times consistently. Pt. continues to work on improving LUE functioning in order to improve left sided awareness, and increase LUE engagement in ADLs, and IADL tasks to maximize independence, and decrease the need for caregiver support.    OT Occupational Profile and History  Problem Focused Assessment - Including review of records relating to presenting problem    Occupational performance deficits (Please refer to evaluation for details):  ADL's    Body Structure / Function / Physical Skills  UE functional use;Coordination;FMC;Dexterity;Strength;ROM    Cognitive Skills  Attention;Memory;Emotional;Problem Solve;Safety Awareness    Rehab Potential  Good    Clinical Decision Making  Several treatment options, min-mod task modification necessary    Comorbidities Affecting Occupational Performance:  Presence of comorbidities impacting occupational performance    Comorbidities impacting occupational performance description:  Phyical, cognitive, visual,  medical comorbidities    Modification or Assistance to Complete Evaluation   No modification of tasks or assist necessary to complete eval    OT Frequency  2x / week    OT Duration  12 weeks    OT Treatment/Interventions  Self-care/ADL training;DME and/or AE instruction;Therapeutic  exercise;Therapeutic activities;Moist Heat;Cognitive remediation/compensation;Neuromuscular education;Visual/perceptual remediation/compensation;Coping strategies training;Patient/family education;Passive range of motion;Psychosocial skills training;Energy conservation;Functional Mobility Training    Consulted and Agree with Plan of Care  Patient    Family Member Consulted  Daughter [name]       Patient will benefit from skilled therapeutic intervention in order to improve the following deficits and impairments:   Body Structure / Function / Physical Skills: UE functional use, Coordination, FMC, Dexterity, Strength, ROM Cognitive Skills: Attention, Memory, Emotional, Problem Solve, Safety Awareness     Visit Diagnosis: Muscle weakness (generalized)  Other lack of coordination    Problem List Patient Active Problem List   Diagnosis Date Noted  . Sepsis ([place]) 10/18/2018    Harrel Carina, MS, OTR/L 02/06/2019, 2:52 PM  [place] MAIN [facility] SERVICES [address] [place], [place], [address] Phone: [phone]   Fax:  [phone]  Name: ESTABAN MURRAY MRN: [id number] Date of Birth: 04-21-1945

## 2019-02-06 NOTE — Therapy (Signed)
[place] MAIN [facility] SERVICES [address] [place], [place], [address] Phone: [phone]   Fax:  [phone]  Physical Therapy Treatment  Patient Details  Name: Kenneth Summers MRN: [id number] Date of Birth: 1945-02-22 Referring Provider (PT): Dr. [name], Guy Begin   Encounter Date: 02/06/2019  PT End of Session - 02/06/19 1344    Visit Number  [id number]    Number of Visits  108    Date for PT Re-Evaluation  02/27/19    Authorization Type  Medicare reporting period starting 09/11/18    Authorization Time Period  goals last assessed 01/13/19    PT Start Time  1300    PT Stop Time  1345    PT Time Calculation (min)  45 min    Equipment Utilized During Treatment  Gait belt    Activity Tolerance  Patient tolerated treatment well    Behavior During Therapy  [facility] for tasks assessed/performed       Past Medical History:  Diagnosis Date  . Alcohol abuse   . APS (antiphospholipid syndrome) ([place])   . BPH (benign prostatic hyperplasia)   . CAD (coronary artery disease)   . Cellulitis   . Cervical spinal stenosis   . Chronic osteomyelitis ([place])   . CKD (chronic kidney disease)   . CKD (chronic kidney disease)   . Clostridium difficile diarrhea   . Depression   . Diplopia   . Fatigue   . GERD (gastroesophageal reflux disease)   . Hyperlipemia   . Hypertension   . Hypovitaminosis D   . IBS (irritable bowel syndrome)   . 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 Insomnia   . Left lumbosacral radiculopathy   . Moderate obstructive sleep apnea   . Osteomyelitis of foot ([place])   . PAD (peripheral artery disease) ([place])   . Pruritus   . RA (rheumatoid arthritis) ([place])   . Radiculopathy   . Restless legs syndrome   . RLS (restless legs syndrome)   . SLE (systemic lupus erythematosus related syndrome) ([place])   . Stroke ([place])   . Toxic maculopathy of both eyes     Past Surgical History:  Procedure Laterality Date  . CARDIAC CATHETERIZATION    . CAROTID  ENDARTERECTOMY    . CERVICAL LAMINECTOMY    . CORONARY ANGIOPLASTY    . FRACTURE SURGERY    . fusion C5-6-7    . HERNIA REPAIR    . KNEE ARTHROSCOPY    . TONSILLECTOMY      There were no vitals filed for this visit.  Subjective Assessment - 02/06/19 1312    Subjective  Patient reports he is doing alright today. He continues to report LUE burning pain in hand. Otherwise no specific questions or concerns at start of session.    Patient is accompained by:  Family member    Pertinent History  Patient is a 73 y.o. male who presents to outpatient physical therapy with a referral for medical diagnosis of CVA. This patient's chief complaints consist of left hemiplegia and overall deconditioning leading to the following functional deficits: dependent for ADLs, IADLs, unable to transfer to car, dependent transfers at home with hoyer lift, unable to walk or stand without significant assistance, difficulty with W/C navigation..    Limitations  Sitting;Lifting;Standing;House hold activities;Writing;Walking;Other (comment)    Diagnostic tests  Brain MRI 11/01/2017: IMPRESSION: 1. Acute right ACA territory infarct as demonstrated on prior CT imaging. No intracranial hemorrhage or significant mass effect. 2. 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    Patient Stated Goals  wants to be able to walk again    Currently in Pain?  No/denies   No resting pain but pt has been reporting L hand "burning" pain recently       TREATMENT   Therapeutic Activity Patient transferredstandpivot wheelchair<>nustep with BUE and therapist assistance, min A +1, +2 present for safety but does not assist. Patient does require increased time for proper positioning and set up but was able to exhibit good safety with transfer; He requires cues for walking LLE foot around and required min A for placing LLE foot onto foot plate;  Patient sitting in power chair with power chair elevated and  tilted forward for better positioning:(seat height 27 inches from floor) Sit<>stand transfersattreadmillx3reps; He was able to transfer withminA+1 for first rep and modA+1 for additional 2 reps. He did not require therapist assist to block LLE kneebut did require assistance for forward weight shift,proper positioning, and cues to push with RUE from wheelchair. Patient demonstrates considerably more fatigue than last session and is only able to stand for approximately 10s each bout.Instructed patient to reach back for chair to improve safety awareness.  He required minA for repositioning in chair at end of session;   Ther-ex Instructed patient in Nustep, seat #15, arms #14 Pt instructed in BUE/BLE level3x79mnwith cues to increase speedto around 50-55steps per minute to challenge cardiovascular endurance with cues to avoid stopping and to continue exercise. Distance propelled: 0.133mes. Pt demonstrates good endurance today until the last 45-60s when he starts to fatigue;  Patient seated:  AlternatingLAQ 2 x 10reps each with mod VCs to increase AROM for better muscle activation and strengthening in each LE. Patient able to achieve partial ROM on LLE due to weakness; Seated marches alternating LE 2 x 10 each; Seated clams with manual resistance from therapist 2 x 10; Seated adductor squeeze with manual resistance from therapist 2 x 10;   Pt educated throughout session about proper posture and technique with exercises. Improved exercise technique, movement at target joints, use of target muscles after min to mod verbal, visual, tactile cues.   Patientdemonstrates good motivation throughout today's session.He is notably more fatigued today with sit to stand transfers and it is unclear if this is related to starting session with NuStep or just general fatigue. He is only able to stand for approximately 10s during each bout today so more time spent on seated exercises.  Pt will benefit from continued skilled PT services in order to increase strength and functional mobilityin order to decrease caregiver burden, improve his ability to perform ADLs, and to improve his QOL.                            PT Short Term Goals - 01/13/19 1721      PT SHORT TERM GOAL #1   Title  Be independent with initial home exercise program for self-management of symptoms.    Baseline  HEP to be given at second session; advice to practice unsupported sitting at home (06/19/2018); 07/08/18: Performing at home, 6/29: needs assistance but is doing exercise program regularly; 08/22/18: adherent;    Time  2    Period  Weeks    Status  Achieved    Target Date  06/26/18        PT Long Term Goals - 01/13/19 1721      PT LONG TERM  GOAL #1   Title  Patient will complete all bed mobility with min A to improve functional independence for getting in and out of bed and adjusting in bed.     Baseline  max A - total A reported by family (06/19/2018); 07/08/18: still requires maxA, dtr reports improvement in rolling since starting therapy; 6/23: min A to supervision in clinic; not doing at home right; 8/5: Pt's daughter states that his rolling has improved, but still has difficulty with all other bed mobility; 10/23/18: pts daughter states that she is doing about 35% of the work if he is in proper position, he does uses the bed rails to help roll;  using Hoyer for coming to sit EOB; 10/12: pts daughter states that she is doing about 30% of the work if he is in proper position, he does uses the bed rails to help roll, but it is improving;  using Hoyer for coming to sit EOB, 10/29: min A for sit to sidelying, rolling supervision, able to transition sidelying to sit with HHA;    Time  12    Period  Weeks    Status  Achieved      PT LONG TERM GOAL #2   Title  Patient will complete sit <> stand transfer chair to chair with Min A and LRAD to improve functional independence for  household and community mobility.     Baseline  max A +2 STS at W/C (06/12/2018); max A +2-3 STS or stand pivot transfer, able to complete more reps (06/19/2018); 07/08/18: not safe to attempt at this time; 09/11/18: requires maxA+1 or modA+2 for STSs and squat pivot transfer; 10/23/18: requires maxA+1 or modA+2 for STSs and squat pivot transfer; 10/12: requires maxA+1 or modA+2 for STSs and squat pivot transfer; 10/29: patient able to complete sit<>Stand transfer min A +1 with parallel bars and RW, although inconsistent, mod A for stand pivot; 01/13/19: Pt able to perform sit to stand with parallel bars and RW with minA+1 and a few times with CGA only, still requires modA+1 for stand pivot transfer.    Time  12    Period  Weeks    Status  Partially Met    Target Date  02/27/19      PT LONG TERM GOAL #3   Title  Patient will navigate power w/c with min A x 100 feet to improve mobility for household and short community distances.    Baseline  required total A (06/12/2018); able to roll 20 feet with min A (06/20/2018); 07/08/18: Pt can go approximately 10' without assist per family;  10/23/2018: pt's daughter states that his power WC mobility has become 100% better with the new hand control, slowed speed, and with practice; 10/12: pt's daughter reports that he can perform household WC mobility and limited community ambulation with minA    Time  12    Period  Weeks    Status  Achieved      PT LONG TERM GOAL #4   Title  Patient will complete car transfers W/C to family SUV with min A using LRAD to improve his abilty to participate in community activities.     Baseline  unable to complete car transfers at all (06/12/2018); unable to complete car transfers at all (06/19/2018).; 07/08/18: Unable to perform at this time; 09/11/18: unable to perform at this time; 10/23/2018: unable to complete car transfers at all; 10/12:  unable to complete car transfers at all, 10/29: unable; 01/13/19: unable;  Time  12    Period  Weeks     Status  On-going    Target Date  02/27/19      PT LONG TERM GOAL #5   Title  Patient will ambulate at least 150 feet with mod I using LRAD to improve mobility for household and community distances.     Baseline  unable to take steps (06/12/2018); able to shift R leg forward and back with max A and RW at edge of plinth (06/19/2018); 07/08/18: unable to ambulate at this time. 09/11/18: unable to perform at this time; 10/23/2018: unable to perform at this time; 10/12: unable to perform at this time, 10/29: unable at this time; 01/13/19: unable at this time    Time  12    Period  Weeks    Status  On-going    Target Date  02/27/19      PT LONG TERM GOAL #6   Title  Patient will be able to safety navigate up/down ramp with power chair, exhibiting good safety awareness, mod I to safely enter/exit his home.    Baseline  unable to complet stairs (06/12/2018); unable to complete stairs (06/19/2018); 07/08/18: unable to attempt at this time; 09/11/18: unsafe to attempt at this time; 10/23/2018: unable to complet stairs (06/12/2018); unable to complete stairs (06/19/2018); 07/08/18: unable to attempt at this time; 09/11/18: unsafe to attempt at this time; 10/23/18: unsafe to attempt at this time, 10/29: requires superivison and occasional min A for safety; 01/13/19: unchanged from last update    Time  12    Period  Weeks    Status  On-going    Target Date  02/27/19      PT LONG TERM GOAL #7   Title  Patient will increase functional reach test to >15 inches in sitting to exhibit improved sitting balance and positioning and reduce fall risk;    Baseline  07/30/18: 12 inches; 09/11/18: 10-12 inches; 10/23/18: 12-13 inch; 10/12: 14.5", 10/29: 15.5 inch    Time  4    Period  Weeks    Status  Achieved      PT LONG TERM GOAL #8   Title  Patient will increase LLE quad strength to 3+/5 to improve functional strength for standing and mobility;    Baseline  10/28: 2+/5; 01/13/19: 2+/5    Time  12    Period  Weeks    Status  On-going     Target Date  02/27/19            Plan - 02/06/19 1411    Clinical Impression Statement  Patient demonstrates good motivation throughout today's session. He is notably more fatigued today with sit to stand transfers and it is unclear if this is related to starting session with NuStep or just general fatigue. He is only able to stand for approximately 10s during each bout today so more time spent on seated exercises. Pt will benefit from continued skilled PT services in order to increase strength and functional mobility in order to decrease caregiver burden, improve his ability to perform ADLs, and to improve his QOL.    Personal Factors and Comorbidities  Age;Comorbidity 3+    Comorbidities  Relevant past medical history and comorbidities include long term steroid use for lupus, CKD, chronic osteomyelitis, cervical spine stenosis, BPH, APS, alcohol abuse, peripheral artery disease, depression, GERD, obstructive sleep apnea, HTN, IBS, IgA deficiency, lumbar radiculopathy, RA, Stroke, toxic maculopathy in both eyes, systemic lupus erythematosus related syndrome, cardiac catheterization,  carotid endarterectomy, cervical laminectomy, coronary angioplasty, Fusion C5-C7, knee arthroplasty, lumbar surgery.    Examination-Activity Limitations  Bed Mobility;Bend;Sit;Toileting;Stand;Stairs;Lift;Transfers;Squat;Locomotion Level;Carry;Dressing;Hygiene/Grooming;Continence    Examination-Participation Restrictions  Yard Work;Interpersonal Relationship;Community Activity    Stability/Clinical Decision Making  Evolving/Moderate complexity    Rehab Potential  Fair    PT Frequency  3x / week    PT Duration  12 weeks    PT Treatment/Interventions  ADLs/Self Care Home Management;Electrical Stimulation;Moist Heat;Cryotherapy;Gait training;Stair training;Functional mobility training;Therapeutic exercise;Balance training;Neuromuscular re-education;Cognitive remediation;Patient/family education;Orthotic  Fit/Training;Wheelchair mobility training;Manual techniques;Passive range of motion;Energy conservation;Joint Manipulations    PT Next Visit Plan  Work on strength, sitting/standing balance, functional activities such as rolling, bed mobility, transfers; caregiver training    PT Home Exercise Plan  handout provided via Beth during previous sessions    Consulted and Agree with Plan of Care  Patient;Family member/caregiver    Family Member Consulted  daughter [name]       Patient will benefit from skilled therapeutic intervention in order to improve the following deficits and impairments:  Abnormal gait, Decreased activity tolerance, Decreased cognition, Decreased endurance, Decreased knowledge of use of DME, Decreased range of motion, Decreased skin integrity, Decreased strength, Impaired perceived functional ability, Impaired sensation, Impaired UE functional use, Improper body mechanics, Pain, Cardiopulmonary status limiting activity, Decreased balance, Decreased coordination, Decreased mobility, Difficulty walking, Impaired tone, Postural dysfunction  Visit Diagnosis: Muscle weakness (generalized)  Difficulty in walking, not elsewhere classified     Problem List Patient Active Problem List   Diagnosis Date Noted  . Sepsis ([place]) 10/18/2018   Phillips Grout PT, DPT, GCS  Pastor Sgro 02/06/2019, 5:39 PM  [place] MAIN [facility] SERVICES [address] [place], [place], [address] Phone: [phone]   Fax:  [phone]  Name: Kenneth Summers MRN: [id number] Date of Birth: May 19, 1945

## 2019-02-10 ENCOUNTER — Other Ambulatory Visit: Payer: Self-pay

## 2019-02-10 ENCOUNTER — Ambulatory Visit: Payer: Medicare Other | Attending: Family Medicine | Admitting: Occupational Therapy

## 2019-02-10 ENCOUNTER — Ambulatory Visit: Payer: Medicare Other

## 2019-02-10 ENCOUNTER — Encounter: Payer: Self-pay | Admitting: Occupational Therapy

## 2019-02-10 DIAGNOSIS — R262 Difficulty in walking, not elsewhere classified: Secondary | ICD-10-CM | POA: Diagnosis present

## 2019-02-10 DIAGNOSIS — M6281 Muscle weakness (generalized): Secondary | ICD-10-CM

## 2019-02-10 DIAGNOSIS — R296 Repeated falls: Secondary | ICD-10-CM | POA: Insufficient documentation

## 2019-02-10 DIAGNOSIS — R278 Other lack of coordination: Secondary | ICD-10-CM | POA: Diagnosis present

## 2019-02-10 DIAGNOSIS — I69354 Hemiplegia and hemiparesis following cerebral infarction affecting left non-dominant side: Secondary | ICD-10-CM | POA: Diagnosis present

## 2019-02-10 NOTE — Therapy (Addendum)
[place] MAIN [facility] SERVICES [address] [place], [place], [address] Phone: [phone]   Fax:  [phone]  Occupational Therapy Treatment  Patient Details  Name: Kenneth Summers MRN: [id number] Date of Birth: 10-30-1945 No data recorded  Encounter Date: 02/10/2019  OT End of Session - 02/10/19 1654    Visit Number  39    Number of Visits  108    Date for OT Re-Evaluation  04/07/19    Authorization Type  Progress report period starting 12/26/2018    OT Start Time  1350    OT Stop Time  1430    OT Time Calculation (min)  40 min    Activity Tolerance  Patient tolerated treatment well;Patient limited by fatigue    Behavior During Therapy  [facility] for tasks assessed/performed       Past Medical History:  Diagnosis Date  . Alcohol abuse   . APS (antiphospholipid syndrome) ([place])   . BPH (benign prostatic hyperplasia)   . CAD (coronary artery disease)   . Cellulitis   . Cervical spinal stenosis   . Chronic osteomyelitis ([place])   . CKD (chronic kidney disease)   . CKD (chronic kidney disease)   . Clostridium difficile diarrhea   . Depression   . Diplopia   . Fatigue   . GERD (gastroesophageal reflux disease)   . Hyperlipemia   . Hypertension   . Hypovitaminosis D   . IBS (irritable bowel syndrome)   . IgA deficiency ([place])   . Insomnia   . Left lumbosacral radiculopathy   . Moderate obstructive sleep apnea   . Osteomyelitis of foot ([place])   . PAD (peripheral artery disease) ([place])   . Pruritus   . RA (rheumatoid arthritis) ([place])   . Radiculopathy   . Restless legs syndrome   . RLS (restless legs syndrome)   . SLE (systemic lupus erythematosus related syndrome) ([place])   . Stroke ([place])   . Toxic maculopathy of both eyes     Past Surgical History:  Procedure Laterality Date  . CARDIAC CATHETERIZATION    . CAROTID ENDARTERECTOMY    . CERVICAL LAMINECTOMY    . CORONARY ANGIOPLASTY    . FRACTURE SURGERY    . fusion C5-6-7    .  HERNIA REPAIR    . KNEE ARTHROSCOPY    . TONSILLECTOMY      There were no vitals filed for this visit.  Subjective Assessment - 02/10/19 1654    Subjective   Pt. reports being tired today.    Patient is accompanied by:  Family member    Pertinent History  Pt. is a 74 y.o. 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 Pt. PMHx includes: Multiple Falls, Lupus, DJD, Renal Abscess, CVA. Pt. resides with his wife. Pt.'s wife and daughter assist with ADLs. Pt. has caregivers in  for 2 hours a day, 6 days a week. Pt. received Rehab services in acute care, at SNF for STR, and [place] services. Pt. is retired from [employer] for [employer] and [employer].    Patient Stated Goals  To regain use of his left UE, and do more for himself.    Currently in Pain?  No/denies      OT TREATMENT    Neuro muscular re-education:  Pt. worked on using the left hand for grasping 1" resistive cubes alternating thumb opposition to the tip of the 2nd digit while the board is placed at  a vertical angle positioned to the left. Pt. worked on pressing the cubes back into place while alternating isolated 2nd digit extension. Pt. requires cues to use his left hand.  Therapeutic Exercise:  Pt. performed 2# dowel ex. for UE strengthening secondary to weakness. Bilateral shoulder flexion, chest press, and circular patterns were performed. 3# dumbbell ex. for elbow flexion and extension,  forearm supination, and 2# for wrist extension. Pt. requires rest breaks and verbal cues for proper technique.  Response to Treatment:  Pt. reports that he has an MD appointment to address his neck pain, and LUE pain when laying down. Pt. reports he thinks he may have a pinched nerve when he lays down. Pt. tolerated UE ther. ex well with rest breaks. Pt. continues to present with limited LUE  Functioning, UE strength, motor control,and [facility] skills, and continues to work on these skills in order to improve, and  maximize independence with ADLs, and IADLs.                        OT Education - 02/10/19 1654    Education Details  UE functioning    Person(s) Educated  Patient    Methods  Explanation;Demonstration    Comprehension  Returned demonstration;Tactile cues required;Verbalized understanding;Need further instruction          OT Long Term Goals - 01/27/19 1404      OT LONG TERM GOAL #1   Title  Pt. will increase left shoulder flexion AROM by 10 degrees to access cabinet/shelf.    Baseline  Pt. is improving with LUE ROM, and functional reaching.    Time  12    Period  Weeks    Status  On-going    Target Date  04/07/19      OT LONG TERM GOAL #2   Title  Pt. will donn a shirt with Supervision.    Baseline  MinA donning a zip down jacket    Time  12    Period  Weeks    Status  On-going    Target Date  04/07/19      OT LONG TERM GOAL #4   Title  Pt. will improve left grip strength by 10# to be able to open a jar/container.    Time  12    Period  Weeks    Status  On-going    Target Date  04/07/19      OT LONG TERM GOAL #5   Title  Pt. will improve left hand [facility] skills to be able to assist with buttoning/zipping.    Baseline  Pt. has made excellent progress with bilateral [facility] skills. Pt. continues to work on improving bilateral [facility] skills for buttoning/zipping    Time  12    Period  Weeks    Status  On-going    Target Date  04/07/19      OT LONG TERM GOAL #6   Title  Pt. will demonstrate visual conmpensatory strategies during 100% of the time during ADLs    Baseline  Pt. requires cues at times for left sides awareness.    Time  12    Period  Weeks    Status  On-going    Target Date  04/07/19      OT LONG TERM GOAL #8   Title  Pt. will accurately identify potential safety hazard using good safety awareness, and judgement 100% for ADLs, and IADLs.    Baseline  Pt. continues to  be limited    Time  12    Period  Weeks    Status  On-going    Target  Date  04/07/19      OT LONG TERM GOAL  #9   TITLE  Pt. will  navigate his w/c around obstacles with Supervision and 100% accuracy    Baseline  Pt. continues to require consistent cuing for obstacle on the left    Time  12    Period  Weeks    Status  On-going    Target Date  04/07/19      OT LONG TERM GOAL  #11   TITLE  Pt. will increase BUE strength by 45mm grades in preparation for ADL transfers.    Baseline  Limited BUE strength    Time  12    Period  Weeks    Status  On-going    Target Date  04/07/19            Plan - 02/10/19 1655    Clinical Impression Statement Pt. reports that he has an MD appointment to address his neck pain, and LUE pain when laying down. Pt. reports he thinks he may have a pinched nerve when he lays down. Pt. tolerated UE ther. ex well with rest breaks. Pt. continues to present with limited LUE  Functioning, UE strength, motor control,and [facility] skills, and continues to work on these skills in order to improve, and maximize independence with ADLs, and IADLs..    OT Occupational Profile and History  Problem Focused Assessment - Including review of records relating to presenting problem    Occupational performance deficits (Please refer to evaluation for details):  ADL's    Body Structure / Function / Physical Skills  UE functional use;Coordination;FMC;Dexterity;Strength;ROM    Cognitive Skills  Attention;Memory;Emotional;Problem Solve;Safety Awareness    Rehab Potential  Good    Clinical Decision Making  Several treatment options, min-mod task modification necessary    Comorbidities Affecting Occupational Performance:  Presence of comorbidities impacting occupational performance    Comorbidities impacting occupational performance description:  Phyical, cognitive, visual,  medical comorbidities    Modification or Assistance to Complete Evaluation   No modification of tasks or assist necessary to complete eval    OT Frequency  2x / week    OT Duration  12  weeks    OT Treatment/Interventions  Self-care/ADL training;DME and/or AE instruction;Therapeutic exercise;Therapeutic activities;Moist Heat;Cognitive remediation/compensation;Neuromuscular education;Visual/perceptual remediation/compensation;Coping strategies training;Patient/family education;Passive range of motion;Psychosocial skills training;Energy conservation;Functional Mobility Training    Recommended Other Services  PT    Consulted and Agree with Plan of Care  Patient    Family Member Consulted  Daughter [name]       Patient will benefit from skilled therapeutic intervention in order to improve the following deficits and impairments:   Body Structure / Function / Physical Skills: UE functional use, Coordination, FMC, Dexterity, Strength, ROM Cognitive Skills: Attention, Memory, Emotional, Problem Solve, Safety Awareness     Visit Diagnosis: Muscle weakness (generalized)  Other lack of coordination    Problem List Patient Active Problem List   Diagnosis Date Noted  . Sepsis ([place]) 10/18/2018    Harrel Carina, MS, OTR/L 02/10/2019, 5:07 PM  [place] MAIN [facility] SERVICES [address] [place], [place], [address] Phone: [phone]   Fax:  [phone]  Name: KAMIR SCHUNEMAN MRN: [id number] Date of Birth: 1945-04-11

## 2019-02-10 NOTE — Therapy (Signed)
[place] MAIN [facility] SERVICES [address] [place], [place], [address] Phone: [phone]   Fax:  [phone]  Physical Therapy Treatment  Patient Details  Name: Kenneth Summers MRN: [id number] Date of Birth: December 18, 1945 Referring Provider (PT): Dr. [name], Guy Begin   Encounter Date: 02/10/2019  PT End of Session - 02/10/19 1437    Visit Number  25    Number of Visits  108    Date for PT Re-Evaluation  02/27/19    Authorization Type  Medicare reporting period starting 09/11/18    Authorization Time Period  goals last assessed 01/13/19    Authorization - Visit Number  9    Authorization - Number of Visits  10    PT Start Time  8916    PT Stop Time  1346    PT Time Calculation (min)  40 min    Activity Tolerance  Patient tolerated treatment well;No increased pain    Behavior During Therapy  WFL for tasks assessed/performed       Past Medical History:  Diagnosis Date  . Alcohol abuse   . APS (antiphospholipid syndrome) ([place])   . BPH (benign prostatic hyperplasia)   . CAD (coronary artery disease)   . Cellulitis   . Cervical spinal stenosis   . Chronic osteomyelitis ([place])   . CKD (chronic kidney disease)   . CKD (chronic kidney disease)   . Clostridium difficile diarrhea   . Depression   . Diplopia   . Fatigue   . GERD (gastroesophageal reflux disease)   . Hyperlipemia   . Hypertension   . Hypovitaminosis D   . IBS (irritable bowel syndrome)   . 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 Insomnia   . Left lumbosacral radiculopathy   . Moderate obstructive sleep apnea   . Osteomyelitis of foot ([place])   . PAD (peripheral artery disease) ([place])   . Pruritus   . RA (rheumatoid arthritis) ([place])   . Radiculopathy   . Restless legs syndrome   . RLS (restless legs syndrome)   . SLE (systemic lupus erythematosus related syndrome) ([place])   . Stroke ([place])   . Toxic maculopathy of both eyes     Past Surgical History:  Procedure Laterality Date   . CARDIAC CATHETERIZATION    . CAROTID ENDARTERECTOMY    . CERVICAL LAMINECTOMY    . CORONARY ANGIOPLASTY    . FRACTURE SURGERY    . fusion C5-6-7    . HERNIA REPAIR    . KNEE ARTHROSCOPY    . TONSILLECTOMY      There were no vitals filed for this visit.  Subjective Assessment - 02/10/19 1313    Subjective  Pt reports pain in left hand digits 3/4 worse, especially with cervical extension.    Pertinent History  Patient is a 74 y.o. male who presents to outpatient physical therapy with a referral for medical diagnosis of CVA. This patient's chief complaints consist of left hemiplegia and overall deconditioning leading to the following functional deficits: dependent for ADLs, IADLs, unable to transfer to car, dependent transfers at home with hoyer lift, unable to walk or stand without significant assistance, difficulty with W/C navigation..    Diagnostic tests  Brain MRI 11/01/2017: IMPRESSION: 1. Acute right ACA territory infarct as demonstrated on prior CT imaging. No intracranial hemorrhage or significant mass effect. 2. 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    Patient Stated Goals  wants to be able to  walk again    Currently in Pain?  --   Intermittent pain in left hand, worse with cervical extension.     INTERVENTION THIS DATE  LLE P/ROM author assisted stretching 2x30sec of following:  -knee extension (hamstrings) -SKTC -FABER -ankle dorsiflexion  AA/ROM for graded strengthening in moderate zone -Left LAQ 2x10 -Left marching 2x10 -left hip extension (from terminal flexion to 90) 2x10  A/ROM for strengthening  -Right LAQ 2x10 c 5lb AW  -right hip extension (from terminal flexion to 90) 2x10, manually resisted  -RLE Cable Resisted hip extension: 1x12 @ 17.5lbs, 2x12 @ 22.5 -RUE row 1x10 @ 12.5lbs; 1x10 @ 17.5lbs LUE row 1x10 @ 12.5lbds, 1x10 7.5, cues for elbows back      PT Short Term Goals - 01/13/19 1721      PT SHORT TERM  GOAL #1   Title  Be independent with initial home exercise program for self-management of symptoms.    Baseline  HEP to be given at second session; advice to practice unsupported sitting at home (06/19/2018); 07/08/18: Performing at home, 6/29: needs assistance but is doing exercise program regularly; 08/22/18: adherent;    Time  2    Period  Weeks    Status  Achieved    Target Date  06/26/18        PT Long Term Goals - 01/13/19 1721      PT LONG TERM GOAL #1   Title  Patient will complete all bed mobility with min A to improve functional independence for getting in and out of bed and adjusting in bed.     Baseline  max A - total A reported by family (06/19/2018); 07/08/18: still requires maxA, dtr reports improvement in rolling since starting therapy; 6/23: min A to supervision in clinic; not doing at home right; 8/5: Pt's daughter states that his rolling has improved, but still has difficulty with all other bed mobility; 10/23/18: pts daughter states that she is doing about 35% of the work if he is in proper position, he does uses the bed rails to help roll;  using Hoyer for coming to sit EOB; 10/12: pts daughter states that she is doing about 30% of the work if he is in proper position, he does uses the bed rails to help roll, but it is improving;  using Hoyer for coming to sit EOB, 10/29: min A for sit to sidelying, rolling supervision, able to transition sidelying to sit with HHA;    Time  12    Period  Weeks    Status  Achieved      PT LONG TERM GOAL #2   Title  Patient will complete sit <> stand transfer chair to chair with Min A and LRAD to improve functional independence for household and community mobility.     Baseline  max A +2 STS at W/C (06/12/2018); max A +2-3 STS or stand pivot transfer, able to complete more reps (06/19/2018); 07/08/18: not safe to attempt at this time; 09/11/18: requires maxA+1 or modA+2 for STSs and squat pivot transfer; 10/23/18: requires maxA+1 or modA+2 for STSs and squat  pivot transfer; 10/12: requires maxA+1 or modA+2 for STSs and squat pivot transfer; 10/29: patient able to complete sit<>Stand transfer min A +1 with parallel bars and RW, although inconsistent, mod A for stand pivot; 01/13/19: Pt able to perform sit to stand with parallel bars and RW with minA+1 and a few times with CGA only, still requires modA+1 for stand pivot transfer.  Time  12    Period  Weeks    Status  Partially Met    Target Date  02/27/19      PT LONG TERM GOAL #3   Title  Patient will navigate power w/c with min A x 100 feet to improve mobility for household and short community distances.    Baseline  required total A (06/12/2018); able to roll 20 feet with min A (06/20/2018); 07/08/18: Pt can go approximately 10' without assist per family;  10/23/2018: pt's daughter states that his power WC mobility has become 100% better with the new hand control, slowed speed, and with practice; 10/12: pt's daughter reports that he can perform household WC mobility and limited community ambulation with minA    Time  12    Period  Weeks    Status  Achieved      PT LONG TERM GOAL #4   Title  Patient will complete car transfers W/C to family SUV with min A using LRAD to improve his abilty to participate in community activities.     Baseline  unable to complete car transfers at all (06/12/2018); unable to complete car transfers at all (06/19/2018).; 07/08/18: Unable to perform at this time; 09/11/18: unable to perform at this time; 10/23/2018: unable to complete car transfers at all; 10/12:  unable to complete car transfers at all, 10/29: unable; 01/13/19: unable;    Time  12    Period  Weeks    Status  On-going    Target Date  02/27/19      PT LONG TERM GOAL #5   Title  Patient will ambulate at least 150 feet with mod I using LRAD to improve mobility for household and community distances.     Baseline  unable to take steps (06/12/2018); able to shift R leg forward and back with max A and RW at edge of plinth  (06/19/2018); 07/08/18: unable to ambulate at this time. 09/11/18: unable to perform at this time; 10/23/2018: unable to perform at this time; 10/12: unable to perform at this time, 10/29: unable at this time; 01/13/19: unable at this time    Time  12    Period  Weeks    Status  On-going    Target Date  02/27/19      PT LONG TERM GOAL #6   Title  Patient will be able to safety navigate up/down ramp with power chair, exhibiting good safety awareness, mod I to safely enter/exit his home.    Baseline  unable to complet stairs (06/12/2018); unable to complete stairs (06/19/2018); 07/08/18: unable to attempt at this time; 09/11/18: unsafe to attempt at this time; 10/23/2018: unable to complet stairs (06/12/2018); unable to complete stairs (06/19/2018); 07/08/18: unable to attempt at this time; 09/11/18: unsafe to attempt at this time; 10/23/18: unsafe to attempt at this time, 10/29: requires superivison and occasional min A for safety; 01/13/19: unchanged from last update    Time  12    Period  Weeks    Status  On-going    Target Date  02/27/19      PT LONG TERM GOAL #7   Title  Patient will increase functional reach test to >15 inches in sitting to exhibit improved sitting balance and positioning and reduce fall risk;    Baseline  07/30/18: 12 inches; 09/11/18: 10-12 inches; 10/23/18: 12-13 inch; 10/12: 14.5", 10/29: 15.5 inch    Time  4    Period  Weeks    Status  Achieved  PT LONG TERM GOAL #8   Title  Patient will increase LLE quad strength to 3+/5 to improve functional strength for standing and mobility;    Baseline  10/28: 2+/5; 01/13/19: 2+/5    Time  12    Period  Weeks    Status  On-going    Target Date  02/27/19            Plan - 02/10/19 1437    Clinical Impression Statement  Pt reports fatigue upon arrival, a limiter at last session. Author opted to focus primarily on sitting based program this date. Pt assisted with stretching in LLE, then seated exercises of BLE, AA/ROM in LLE and resisted  exercise in RLE. Pt then moved to cable machine for trial of resisted deep range hip extension and single UE cable row, all to maximize remediation training for optimization of compensatory functional mobility. Pt tolerates session well. Pt/DTR ask about worseing Right hand pain, noted to be worse with cervical extension, although when RUE neural tension is removed, pt is able to extend neck without symptoms. Recommended FU with neurologist regarding this issue, and respective to screening this date, author recommends use of aditional head pillow, as well as limiting neural tension in LUE during sleep positioning.    Personal Factors and Comorbidities  Age;Comorbidity 3+    Comorbidities  Relevant past medical history and comorbidities include long term steroid use for lupus, CKD, chronic osteomyelitis, cervical spine stenosis, BPH, APS, alcohol abuse, peripheral artery disease, depression, GERD, obstructive sleep apnea, HTN, IBS, IgA deficiency, lumbar radiculopathy, RA, Stroke, toxic maculopathy in both eyes, systemic lupus erythematosus related syndrome, cardiac catheterization, carotid endarterectomy, cervical laminectomy, coronary angioplasty, Fusion C5-C7, knee arthroplasty, lumbar surgery.    Examination-Activity Limitations  Bed Mobility;Bend;Sit;Toileting;Stand;Stairs;Lift;Transfers;Squat;Locomotion Level;Carry;Dressing;Hygiene/Grooming;Continence    Examination-Participation Restrictions  Yard Work;Interpersonal Relationship;Community Activity    Stability/Clinical Decision Making  Evolving/Moderate complexity    Clinical Decision Making  Moderate    Rehab Potential  Fair    PT Frequency  3x / week    PT Duration  12 weeks    PT Treatment/Interventions  ADLs/Self Care Home Management;Electrical Stimulation;Moist Heat;Cryotherapy;Gait training;Stair training;Functional mobility training;Therapeutic exercise;Balance training;Neuromuscular re-education;Cognitive remediation;Patient/family  education;Orthotic Fit/Training;Wheelchair mobility training;Manual techniques;Passive range of motion;Energy conservation;Joint Manipulations    PT Next Visit Plan  Work on strength, sitting/standing balance, functional activities such as rolling, bed mobility, transfers; caregiver training    PT Home Exercise Plan  handout provided via Beth during previous sessions    Consulted and Agree with Plan of Care  Patient;Family member/caregiver    Family Member Consulted  daughter [name]       Patient will benefit from skilled therapeutic intervention in order to improve the following deficits and impairments:  Abnormal gait, Decreased activity tolerance, Decreased cognition, Decreased endurance, Decreased knowledge of use of DME, Decreased range of motion, Decreased skin integrity, Decreased strength, Impaired perceived functional ability, Impaired sensation, Impaired UE functional use, Improper body mechanics, Pain, Cardiopulmonary status limiting activity, Decreased balance, Decreased coordination, Decreased mobility, Difficulty walking, Impaired tone, Postural dysfunction  Visit Diagnosis: Muscle weakness (generalized)  Other lack of coordination  Difficulty in walking, not elsewhere classified  Repeated falls  Hemiplegia and hemiparesis following cerebral infarction affecting left non-dominant side [facility])     Problem List Patient Active Problem List   Diagnosis Date Noted  . Sepsis ([place]) 10/18/2018   2:46 PM, 02/10/19 Etta Grandchild, PT, DPT Physical Therapist - [place]  [place] 02/10/2019, 2:45 PM  [place] MAIN [facility] SERVICES [address] [place], [place], [address] Phone: [phone]   Fax:  [phone]  Name: Kenneth Summers MRN: [id number] Date of Birth: 1946-01-07

## 2019-02-12 ENCOUNTER — Ambulatory Visit: Payer: Medicare Other

## 2019-02-12 ENCOUNTER — Encounter: Payer: Medicare Other | Admitting: Occupational Therapy

## 2019-02-12 ENCOUNTER — Other Ambulatory Visit: Payer: Self-pay

## 2019-02-12 DIAGNOSIS — I69354 Hemiplegia and hemiparesis following cerebral infarction affecting left non-dominant side: Secondary | ICD-10-CM

## 2019-02-12 DIAGNOSIS — M6281 Muscle weakness (generalized): Secondary | ICD-10-CM | POA: Diagnosis not present

## 2019-02-12 DIAGNOSIS — R262 Difficulty in walking, not elsewhere classified: Secondary | ICD-10-CM

## 2019-02-12 DIAGNOSIS — R296 Repeated falls: Secondary | ICD-10-CM

## 2019-02-12 NOTE — Therapy (Signed)
[place] MAIN [facility] SERVICES [address] [place], [place], [address] Phone: [phone]   Fax:  [phone]  Physical Therapy Progress Note   Dates of reporting period  01/14/19   to   02/12/19   Patient Details  Name: Kenneth Summers MRN: [id number] Date of Birth: 11/10/1945 Referring Provider (PT): Dr. [name], Guy Begin   Encounter Date: 02/12/2019  PT End of Session - 02/12/19 1630    Visit Number  90    Number of Visits  108    Date for PT Re-Evaluation  02/27/19    Authorization Type  Medicare reporting period starting 09/11/18    Authorization Time Period  goals last assessed 02/12/19    Authorization - Visit Number  10    Authorization - Number of Visits  10    PT Start Time  [id number]    PT Stop Time  [id number]    PT Time Calculation (min)  32 min    Activity Tolerance  Patient tolerated treatment well;No increased pain    Behavior During Therapy  WFL for tasks assessed/performed       Past Medical History:  Diagnosis Date  . Alcohol abuse   . APS (antiphospholipid syndrome) ([place])   . BPH (benign prostatic hyperplasia)   . CAD (coronary artery disease)   . Cellulitis   . Cervical spinal stenosis   . Chronic osteomyelitis ([place])   . CKD (chronic kidney disease)   . CKD (chronic kidney disease)   . Clostridium difficile diarrhea   . Depression   . Diplopia   . Fatigue   . GERD (gastroesophageal reflux disease)   . Hyperlipemia   . Hypertension   . Hypovitaminosis D   . IBS (irritable bowel syndrome)   . 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 Insomnia   . Left lumbosacral radiculopathy   . Moderate obstructive sleep apnea   . Osteomyelitis of foot ([place])   . PAD (peripheral artery disease) ([place])   . Pruritus   . RA (rheumatoid arthritis) ([place])   . Radiculopathy   . Restless legs syndrome   . RLS (restless legs syndrome)   . SLE (systemic lupus erythematosus related syndrome) ([place])   . Stroke ([place])   . Toxic maculopathy of both  eyes     Past Surgical History:  Procedure Laterality Date  . CARDIAC CATHETERIZATION    . CAROTID ENDARTERECTOMY    . CERVICAL LAMINECTOMY    . CORONARY ANGIOPLASTY    . FRACTURE SURGERY    . fusion C5-6-7    . HERNIA REPAIR    . KNEE ARTHROSCOPY    . TONSILLECTOMY      There were no vitals filed for this visit.  Subjective Assessment - 02/12/19 1359    Subjective  Subjective reports collected from pt/DTR: both reports pt being pain free in LUE after recommendations for increased pillow use behind head and LUE positioning to limit radial/median neural tension c additional pillow. Pt also reports pain free dressing this date, absent any pain in fingers. DTR reports continued plans to FU with neurologist regarding this issue. DTR reports as they have arrived late, she would like to prioritize standing with pt as time is limited.    Patient is accompained by:  Family member    Pertinent History  Patient is a 74 y.o. male who presents to outpatient physical therapy with a referral for medical diagnosis of CVA. This patient's chief complaints consist of left hemiplegia and overall  deconditioning leading to the following functional deficits: dependent for ADLs, IADLs, unable to transfer to car, dependent transfers at home with hoyer lift, unable to walk or stand without significant assistance, difficulty with W/C navigation..    Currently in Pain?  No/denies        INTERVENTION THIS DATE: -STS from WC, modA at Left hemipelvis, Left knee block for safety, BUE pulling to standing 3x90seconds Pt only able to sustain 70sec for third bout. Pt requires min to no physical assist once standing, but intermittent tactile/verbal cues to correct trunk flexion Q15sec. Pt given ~90sec recovery period between efforts.  -STS from [place], modA at Left hemipelvis, Left knee block for safety, BUE pulling to standing 1x3, 1x2 (no sustained standing)    PT Short Term Goals - 01/13/19 1721      PT SHORT TERM  GOAL #1   Title  Be independent with initial home exercise program for self-management of symptoms.    Baseline  HEP to be given at second session; advice to practice unsupported sitting at home (06/19/2018); 07/08/18: Performing at home, 6/29: needs assistance but is doing exercise program regularly; 08/22/18: adherent;    Time  2    Period  Weeks    Status  Achieved    Target Date  06/26/18        PT Long Term Goals - 02/12/19 1634      PT LONG TERM GOAL #1   Title  Patient will complete all bed mobility with min A to improve functional independence for getting in and out of bed and adjusting in bed.     Baseline  max A - total A reported by family (06/19/2018); 07/08/18: still requires maxA, dtr reports improvement in rolling since starting therapy; 6/23: min A to supervision in clinic; not doing at home right; 8/5: Pt's daughter states that his rolling has improved, but still has difficulty with all other bed mobility; 10/23/18: pts daughter states that she is doing about 35% of the work if he is in proper position, he does uses the bed rails to help roll;  using Hoyer for coming to sit EOB; 10/12: pts daughter states that she is doing about 30% of the work if he is in proper position, he does uses the bed rails to help roll, but it is improving;  using Hoyer for coming to sit EOB, 10/29: min A for sit to sidelying, rolling supervision, able to transition sidelying to sit with HHA;    Time  12    Period  Weeks    Status  Achieved      PT LONG TERM GOAL #2   Title  Patient will complete sit <> stand transfer chair to chair with Min A and LRAD to improve functional independence for household and community mobility.     Baseline  variable min-modA for STS, still requires elevated surface height to 27"    Time  12    Period  Weeks    Status  On-going    Target Date  02/27/19      PT LONG TERM GOAL #3   Title  Patient will navigate power w/c with min A x 100 feet to improve mobility for household  and short community distances.    Baseline  required total A (06/12/2018); able to roll 20 feet with min A (06/20/2018); 07/08/18: Pt can go approximately 10' without assist per family;  10/23/2018: pt's daughter states that his power WC mobility has become 100% better with  the new hand control, slowed speed, and with practice; 10/12: pt's daughter reports that he can perform household WC mobility and limited community ambulation with minA    Time  12    Period  Weeks    Status  Achieved      PT LONG TERM GOAL #4   Title  Patient will complete car transfers W/C to family SUV with min A using LRAD to improve his abilty to participate in community activities.     Baseline  unable to complete car transfers at all (06/12/2018); unable to complete car transfers at all (06/19/2018).; 07/08/18: Unable to perform at this time; 09/11/18: unable to perform at this time; 10/23/2018: unable to complete car transfers at all; 10/12:  unable to complete car transfers at all, 10/29: unable; 01/13/19: unable;    Time  12    Period  Weeks    Status  On-going    Target Date  02/27/19      PT LONG TERM GOAL #5   Title  Patient will ambulate at least 150 feet with mod I using LRAD to improve mobility for household and community distances.     Time  12    Period  Weeks    Status  Deferred    Target Date  02/27/19      PT LONG TERM GOAL #6   Title  Patient will be able to safety navigate up/down ramp with power chair, exhibiting good safety awareness, mod I to safely enter/exit his home.    Time  12    Period  Weeks    Status  On-going    Target Date  02/27/19      PT LONG TERM GOAL #8   Title  Patient will increase LLE quad strength to 3+/5 to improve functional strength for standing and mobility;    Baseline  10/28: 2+/5; 01/13/19: 2+/5    Time  12    Period  Weeks    Status  On-going    Target Date  02/27/19            Plan - 02/12/19 1631    Clinical Impression Statement  Session focus on STS function this  date at request of DTR d/t late arrival and limited STS activity in recent sessions. Pt does well following cues for postural correction, modA at best to rise to standing, and author provides Lef tknee block for safety and tactile cues at the left hemipelvis and trunk. Pt fatigues after 2 reps stnading, but is able to recover fairly well. Unable to return to new cable rows and hip extension as introduced last session, however pt/DTR report continued contentment with performing these and would like to try them again in future visit.    Comorbidities  Relevant past medical history and comorbidities include long term steroid use for lupus, CKD, chronic osteomyelitis, cervical spine stenosis, BPH, APS, alcohol abuse, peripheral artery disease, depression, GERD, obstructive sleep apnea, HTN, IBS, IgA deficiency, lumbar radiculopathy, RA, Stroke, toxic maculopathy in both eyes, systemic lupus erythematosus related syndrome, cardiac catheterization, carotid endarterectomy, cervical laminectomy, coronary angioplasty, Fusion C5-C7, knee arthroplasty, lumbar surgery.    Rehab Potential  Fair    PT Frequency  3x / week    PT Duration  12 weeks    PT Treatment/Interventions  ADLs/Self Care Home Management;Electrical Stimulation;Moist Heat;Cryotherapy;Gait training;Stair training;Functional mobility training;Therapeutic exercise;Balance training;Neuromuscular re-education;Cognitive remediation;Patient/family education;Orthotic Fit/Training;Wheelchair mobility training;Manual techniques;Passive range of motion;Energy conservation;Joint Manipulations    PT Next Visit Plan  Work on strength, sitting/standing balance, functional activities such as rolling, bed mobility, transfers; caregiver training    PT Home Exercise Plan  no updates this session    Consulted and Agree with Plan of Care  Patient;Family member/caregiver    Family Member Consulted  daughter [name]       Patient will benefit from skilled therapeutic  intervention in order to improve the following deficits and impairments:  Abnormal gait, Decreased activity tolerance, Decreased cognition, Decreased endurance, Decreased knowledge of use of DME, Decreased range of motion, Decreased skin integrity, Decreased strength, Impaired perceived functional ability, Impaired sensation, Impaired UE functional use, Improper body mechanics, Pain, Cardiopulmonary status limiting activity, Decreased balance, Decreased coordination, Decreased mobility, Difficulty walking, Impaired tone, Postural dysfunction  Visit Diagnosis: Muscle weakness (generalized)  Difficulty in walking, not elsewhere classified  Repeated falls  Hemiplegia and hemiparesis following cerebral infarction affecting left non-dominant side [facility])     Problem List Patient Active Problem List   Diagnosis Date Noted  . Sepsis ([place]) 10/18/2018   4:44 PM, 02/12/19 Etta Grandchild, PT, DPT Physical Therapist - [facility]  Outpatient Physical Therapy- [place] [facility] 02/12/2019, 4:39 PM  [place] MAIN [facility] SERVICES [address] [place], [place], [address] Phone: [phone]   Fax:  [phone]  Name: KHALID BERNARDY MRN: [id number] Date of Birth: Jul 26, 1945

## 2019-02-13 ENCOUNTER — Encounter: Payer: Medicare Other | Admitting: Occupational Therapy

## 2019-02-13 ENCOUNTER — Ambulatory Visit: Payer: Medicare Other | Admitting: Physical Therapy

## 2019-02-17 ENCOUNTER — Ambulatory Visit: Payer: Medicare Other | Admitting: Physical Therapy

## 2019-02-17 ENCOUNTER — Ambulatory Visit: Payer: Medicare Other | Admitting: Occupational Therapy

## 2019-02-19 ENCOUNTER — Ambulatory Visit: Payer: Medicare Other | Admitting: Physical Therapy

## 2019-02-19 ENCOUNTER — Encounter: Payer: Medicare Other | Admitting: Occupational Therapy

## 2019-02-19 ENCOUNTER — Encounter: Payer: Self-pay | Admitting: Physical Therapy

## 2019-02-19 ENCOUNTER — Other Ambulatory Visit: Payer: Self-pay

## 2019-02-19 DIAGNOSIS — M6281 Muscle weakness (generalized): Secondary | ICD-10-CM

## 2019-02-19 DIAGNOSIS — R296 Repeated falls: Secondary | ICD-10-CM

## 2019-02-19 DIAGNOSIS — R262 Difficulty in walking, not elsewhere classified: Secondary | ICD-10-CM

## 2019-02-19 NOTE — Therapy (Signed)
[place] MAIN [facility] SERVICES [address] [place], [place], [address] Phone: [phone]   Fax:  [phone]  Physical Therapy Treatment  Patient Details  Name: Kenneth Summers MRN: [id number] Date of Birth: [birth year] Referring Provider (PT): Dr. [name], Guy Begin   Encounter Date: 02/19/2019  PT End of Session - 02/19/19 1442    Visit Number  91    Number of Visits  108    Date for PT Re-Evaluation  02/27/19    Authorization Type  Medicare reporting period starting 09/11/18    Authorization Time Period  goals last assessed 02/12/19    Authorization - Visit Number  10    Authorization - Number of Visits  10    PT Start Time  [id number]    PT Stop Time  1435    PT Time Calculation (min)  48 min    Equipment Utilized During Treatment  Gait belt    Activity Tolerance  Patient tolerated treatment well;No increased pain    Behavior During Therapy  WFL for tasks assessed/performed       Past Medical History:  Diagnosis Date  . Alcohol abuse   . APS (antiphospholipid syndrome) ([place])   . BPH (benign prostatic hyperplasia)   . CAD (coronary artery disease)   . Cellulitis   . Cervical spinal stenosis   . Chronic osteomyelitis ([place])   . CKD (chronic kidney disease)   . CKD (chronic kidney disease)   . Clostridium difficile diarrhea   . Depression   . Diplopia   . Fatigue   . GERD (gastroesophageal reflux disease)   . Hyperlipemia   . Hypertension   . Hypovitaminosis D   . IBS (irritable bowel syndrome)   . IgA deficiency ([place])   . Insomnia   . Left lumbosacral radiculopathy   . Moderate obstructive sleep apnea   . Osteomyelitis of foot ([place])   . PAD (peripheral artery disease) ([place])   . Pruritus   . RA (rheumatoid arthritis) ([place])   . Radiculopathy   . Restless legs syndrome   . RLS (restless legs syndrome)   . SLE (systemic lupus erythematosus related syndrome) ([place])   . Stroke ([place])   . Toxic maculopathy of both eyes      Past Surgical History:  Procedure Laterality Date  . CARDIAC CATHETERIZATION    . CAROTID ENDARTERECTOMY    . CERVICAL LAMINECTOMY    . CORONARY ANGIOPLASTY    . FRACTURE SURGERY    . fusion C5-6-7    . HERNIA REPAIR    . KNEE ARTHROSCOPY    . TONSILLECTOMY      There were no vitals filed for this visit.  Subjective Assessment - 02/19/19 1440    Subjective  Patient reports doing fair. He reports increased dizziness and was really tired on Monday. He had EEG done with no significant results found per daughter. Patient denies any falls. Reports not feeling comfortable standing at home due to fear of falling but otherwise is motivated to continue with HEP;    Patient is accompained by:  Family member    Pertinent History  Patient is a 74 y.o. male who presents to outpatient physical therapy with a referral for medical diagnosis of CVA. This patient's chief complaints consist of left hemiplegia and overall deconditioning leading to the following functional deficits: dependent for ADLs, IADLs, unable to transfer to car, dependent transfers at home with hoyer lift, unable to walk or stand without significant assistance, difficulty  with W/C navigation..    Currently in Pain?  No/denies               TREATMENT: Patient transferredstandpivot wheelchair<>nustep with BUE and therapist assistance, mod A +1;Patient does require increased time for proper positioning and set up; He requires cues for walking LLE foot around and required min A for placing LLE foot onto foot plate;   Instructed patient in Nustep, seat #15, arms #14 Pt instructed in BUE/BLE level2x36minwith cues to increase speedto around 50-55steps per minute to challenge cardiovascular endurance with cues to avoid stopping and to continue exercise. Distance propelled: 0.65miles,   Patient sitting on Nustep seat: Instructed patient in LE strengthening exercise: Yellow tband around BLE: -hip flexion march  x10 reps bilaterally; -LAQ x10 reps bilaterally  Partial ROM on LLE due to weakness with mod VCs for correct exercise technique and positioning for increased muscle activation;  Following therapeutic exercise: Transferred nustep to wheelchair squat pivotwith min A +2 for safety;Patient required cues for hand placement and positioningincluding to increase forward weight shift. He had increased difficulty pivoting to wheelchair likely due to fatigue;  Transferred sit<>Stand in parallel bars (seat adjusted to 27 inch from floor height) x2 reps with 10 sec initial stand progressing to 15-20 sec on 2nd repetition; Required mod VCs for forward weight shift and to increase hip/knee extension in stance. Patient exhibits better safety and stability with min A from therapist for LLE knee extension;   Patient required mod A to scoot back in chair at end of session due to increased fatigue  Response to treatment: Tolerated fair. Patient heavily fatigued at end of session. He required increased cues for proper weight shift and standing position. He did have 1 episode of dizziness, BP assessed 115/57; Daughter reached out to MD regarding need to adjust BP medication; Patient denies any dizziness at end of session.                    PT Education - 02/19/19 1441    Education Details  transfers/exercise, HEP    Person(s) Educated  Patient    Methods  Explanation;Verbal cues    Comprehension  Verbalized understanding;Returned demonstration;Verbal cues required;Need further instruction       PT Short Term Goals - 01/13/19 1721      PT SHORT TERM GOAL #1   Title  Be independent with initial home exercise program for self-management of symptoms.    Baseline  HEP to be given at second session; advice to practice unsupported sitting at home (06/19/2018); 07/08/18: Performing at home, 6/29: needs assistance but is doing exercise program regularly; 08/22/18: adherent;    Time  2    Period   Weeks    Status  Achieved    Target Date  06/26/18        PT Long Term Goals - 02/12/19 1634      PT LONG TERM GOAL #1   Title  Patient will complete all bed mobility with min A to improve functional independence for getting in and out of bed and adjusting in bed.     Baseline  max A - total A reported by family (06/19/2018); 07/08/18: still requires maxA, dtr reports improvement in rolling since starting therapy; 6/23: min A to supervision in clinic; not doing at home right; 8/5: Pt's daughter states that his rolling has improved, but still has difficulty with all other bed mobility; 10/23/18: pts daughter states that she is doing about 35% of the work  if he is in proper position, he does uses the bed rails to help roll;  using Harrel Lemon for coming to sit EOB; 10/12: pts daughter states that she is doing about 30% of the work if he is in proper position, he does uses the bed rails to help roll, but it is improving;  using Hoyer for coming to sit EOB, 10/29: min A for sit to sidelying, rolling supervision, able to transition sidelying to sit with HHA;    Time  12    Period  Weeks    Status  Achieved      PT LONG TERM GOAL #2   Title  Patient will complete sit <> stand transfer chair to chair with Min A and LRAD to improve functional independence for household and community mobility.     Baseline  variable min-modA for STS, still requires elevated surface height to 27"    Time  12    Period  Weeks    Status  On-going    Target Date  02/27/19      PT LONG TERM GOAL #3   Title  Patient will navigate power w/c with min A x 100 feet to improve mobility for household and short community distances.    Baseline  required total A (06/12/2018); able to roll 20 feet with min A (06/20/2018); 07/08/18: Pt can go approximately 10' without assist per family;  10/23/2018: pt's daughter states that his power WC mobility has become 100% better with the new hand control, slowed speed, and with practice; 10/12: pt's  daughter reports that he can perform household WC mobility and limited community ambulation with minA    Time  12    Period  Weeks    Status  Achieved      PT LONG TERM GOAL #4   Title  Patient will complete car transfers W/C to family SUV with min A using LRAD to improve his abilty to participate in community activities.     Baseline  unable to complete car transfers at all (06/12/2018); unable to complete car transfers at all (06/19/2018).; 07/08/18: Unable to perform at this time; 09/11/18: unable to perform at this time; 10/23/2018: unable to complete car transfers at all; 10/12:  unable to complete car transfers at all, 10/29: unable; 01/13/19: unable;    Time  12    Period  Weeks    Status  On-going    Target Date  02/27/19      PT LONG TERM GOAL #5   Title  Patient will ambulate at least 150 feet with mod I using LRAD to improve mobility for household and community distances.     Time  12    Period  Weeks    Status  Deferred    Target Date  02/27/19      PT LONG TERM GOAL #6   Title  Patient will be able to safety navigate up/down ramp with power chair, exhibiting good safety awareness, mod I to safely enter/exit his home.    Time  12    Period  Weeks    Status  On-going    Target Date  02/27/19      PT LONG TERM GOAL #8   Title  Patient will increase LLE quad strength to 3+/5 to improve functional strength for standing and mobility;    Baseline  10/28: 2+/5; 01/13/19: 2+/5    Time  12    Period  Weeks    Status  On-going  Target Date  02/27/19            Plan - 02/19/19 1442    Clinical Impression Statement  Patient motivated and participated well within session. He was instructed in advanced LE strengthening requiring increased time and verbal cues for correct exercise technique. Patient is able to exhibit overall improved muscle activation on LLE however requires increased time and visual/verbal cues. Patient distracted easily requiring cues to reorient to task. Patient  instructed in sit<>Stand transfer. He required increased time and mod VCs for forward weight shift. He exhibits improved stance control with min A from therapist for LLE knee extension. Patient fatigues quickly. He would benefit from additional skilled PT Intervention to improve strength, balance and mobility;    Comorbidities  Relevant past medical history and comorbidities include long term steroid use for lupus, CKD, chronic osteomyelitis, cervical spine stenosis, BPH, APS, alcohol abuse, peripheral artery disease, depression, GERD, obstructive sleep apnea, HTN, IBS, IgA deficiency, lumbar radiculopathy, RA, Stroke, toxic maculopathy in both eyes, systemic lupus erythematosus related syndrome, cardiac catheterization, carotid endarterectomy, cervical laminectomy, coronary angioplasty, Fusion C5-C7, knee arthroplasty, lumbar surgery.    Rehab Potential  Fair    PT Frequency  3x / week    PT Duration  12 weeks    PT Treatment/Interventions  ADLs/Self Care Home Management;Electrical Stimulation;Moist Heat;Cryotherapy;Gait training;Stair training;Functional mobility training;Therapeutic exercise;Balance training;Neuromuscular re-education;Cognitive remediation;Patient/family education;Orthotic Fit/Training;Wheelchair mobility training;Manual techniques;Passive range of motion;Energy conservation;Joint Manipulations    PT Next Visit Plan  Work on strength, sitting/standing balance, functional activities such as rolling, bed mobility, transfers; caregiver training    PT Home Exercise Plan  no updates this session    Consulted and Agree with Plan of Care  Patient;Family member/caregiver    Family Member Consulted  daughter [name]       Patient will benefit from skilled therapeutic intervention in order to improve the following deficits and impairments:  Abnormal gait, Decreased activity tolerance, Decreased cognition, Decreased endurance, Decreased knowledge of use of DME, Decreased range of motion,  Decreased skin integrity, Decreased strength, Impaired perceived functional ability, Impaired sensation, Impaired UE functional use, Improper body mechanics, Pain, Cardiopulmonary status limiting activity, Decreased balance, Decreased coordination, Decreased mobility, Difficulty walking, Impaired tone, Postural dysfunction  Visit Diagnosis: Muscle weakness (generalized)  Difficulty in walking, not elsewhere classified  Repeated falls     Problem List Patient Active Problem List   Diagnosis Date Noted  . Sepsis ([place]) 10/18/2018    Gleb Mcguire PT, DPT 02/20/2019, 7:50 AM  [place] MAIN [facility] SERVICES [address] [place], [place], [address] Phone: [phone]   Fax:  [phone]  Name: Kenneth Summers MRN: [id number] Date of Birth: 12-04-1945

## 2019-02-20 ENCOUNTER — Encounter: Payer: Medicare Other | Admitting: Occupational Therapy

## 2019-02-20 ENCOUNTER — Ambulatory Visit: Payer: Medicare Other | Admitting: Physical Therapy

## 2019-02-24 ENCOUNTER — Encounter: Payer: Medicare Other | Admitting: Occupational Therapy

## 2019-02-24 ENCOUNTER — Ambulatory Visit: Payer: Medicare Other | Admitting: Physical Therapy

## 2019-02-26 ENCOUNTER — Other Ambulatory Visit: Payer: Self-pay

## 2019-02-26 ENCOUNTER — Encounter: Payer: Medicare Other | Admitting: Occupational Therapy

## 2019-02-26 ENCOUNTER — Encounter: Payer: Self-pay | Admitting: Physical Therapy

## 2019-02-26 ENCOUNTER — Ambulatory Visit: Payer: Medicare Other | Admitting: Physical Therapy

## 2019-02-26 DIAGNOSIS — R262 Difficulty in walking, not elsewhere classified: Secondary | ICD-10-CM

## 2019-02-26 DIAGNOSIS — R296 Repeated falls: Secondary | ICD-10-CM

## 2019-02-26 DIAGNOSIS — M6281 Muscle weakness (generalized): Secondary | ICD-10-CM

## 2019-02-26 NOTE — Therapy (Signed)
[place] MAIN [facility] SERVICES [address] [place], [place], [address] Phone: [phone]   Fax:  [phone]  Physical Therapy Treatment  Patient Details  Name: Kenneth Summers MRN: [id number] Date of Birth: 07/18/1945 Referring Provider (PT): Dr. [name], Guy Begin   Encounter Date: 02/26/2019  PT End of Session - 02/26/19 1323    Visit Number  92    Number of Visits  108    Date for PT Re-Evaluation  02/27/19    Authorization Type  Medicare reporting period starting 09/11/18    Authorization Time Period  goals last assessed 02/12/19    Authorization - Visit Number  10    Authorization - Number of Visits  10    PT Start Time  1302    PT Stop Time  1345    PT Time Calculation (min)  43 min    Equipment Utilized During Treatment  Gait belt    Activity Tolerance  Patient tolerated treatment well;No increased pain    Behavior During Therapy  WFL for tasks assessed/performed       Past Medical History:  Diagnosis Date  . Alcohol abuse   . APS (antiphospholipid syndrome) ([place])   . BPH (benign prostatic hyperplasia)   . CAD (coronary artery disease)   . Cellulitis   . Cervical spinal stenosis   . Chronic osteomyelitis ([place])   . CKD (chronic kidney disease)   . CKD (chronic kidney disease)   . Clostridium difficile diarrhea   . Depression   . Diplopia   . Fatigue   . GERD (gastroesophageal reflux disease)   . Hyperlipemia   . Hypertension   . Hypovitaminosis D   . IBS (irritable bowel syndrome)   . 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 Insomnia   . Left lumbosacral radiculopathy   . Moderate obstructive sleep apnea   . Osteomyelitis of foot ([place])   . PAD (peripheral artery disease) ([place])   . Pruritus   . RA (rheumatoid arthritis) ([place])   . Radiculopathy   . Restless legs syndrome   . RLS (restless legs syndrome)   . SLE (systemic lupus erythematosus related syndrome) ([place])   . Stroke ([place])   . Toxic maculopathy of both eyes      Past Surgical History:  Procedure Laterality Date  . CARDIAC CATHETERIZATION    . CAROTID ENDARTERECTOMY    . CERVICAL LAMINECTOMY    . CORONARY ANGIOPLASTY    . FRACTURE SURGERY    . fusion C5-6-7    . HERNIA REPAIR    . KNEE ARTHROSCOPY    . TONSILLECTOMY      There were no vitals filed for this visit.  Subjective Assessment - 02/26/19 1320    Subjective  Patient reports doing well; no pain; reports getting a restorator and has been doing bike exercises 2x a week. He reports adherence to other exercises.    Patient is accompained by:  Family member    Pertinent History  Patient is a 74 y.o. male who presents to outpatient physical therapy with a referral for medical diagnosis of CVA. This patient's chief complaints consist of left hemiplegia and overall deconditioning leading to the following functional deficits: dependent for ADLs, IADLs, unable to transfer to car, dependent transfers at home with hoyer lift, unable to walk or stand without significant assistance, difficulty with W/C navigation..    Currently in Pain?  No/denies            TREATMENT:  Transferred sit<>Stand in parallel bars (seat adjusted to 27 inch from floor height) x2 reps with 10 sec initial stand progressing to 40 sec on 2nd repetition; Required min A for forward weight shift and cues for proper hand placement and weight shift.   Progressed to sit<>Stand transfer, min A, and then standing in parallel bars:  Mini squat x3 reps  Mini squat x5 reps with min A for proper weight shift as patient exhibits heavy posterior lean; Patient required tactile and verbal cues for weight shift to neutral position and then initiated mini squat. With initial standing, patient often shifted to RLE;  Seated: Yellow tband around BLE: Hip flexion march 2x10 with good AROM noted in BLE.  During each seated rest break, educated patient in diaphragmatic breathing (x5 reps) for better breath support and to reduce  dyspnea on exertion.   In Standing: Hip flexion march attempted LLE, unable, able to complete 5 marches RLE with min A and LLE knee block into extension for stability;   Patient required mod A to scoot back in chair at end of session due to increased fatigue  Response to treatment: Tolerated fair. With all transfers patient required mod VCs for forward weight shift and to increase hip/knee extension in stance. Patient requires min A with intermittent LLE knee blocking for better transfer ability;   Patient heavily fatigued at end of session. He required increased cues for proper weight shift and standing position. He denies any dizziness this session but did report dizziness last weekend. BP assessed 103/57 following 2nd transfer; Daughter reached out to MD regarding need to adjust BP medication; Patient denies any dizziness at end of session.                       PT Education - 02/26/19 1322    Education Details  transfers/ HEP    Person(s) Educated  Patient;Child(ren)    Methods  Explanation;Verbal cues    Comprehension  Verbalized understanding;Returned demonstration;Verbal cues required;Need further instruction       PT Short Term Goals - 01/13/19 1721      PT SHORT TERM GOAL #1   Title  Be independent with initial home exercise program for self-management of symptoms.    Baseline  HEP to be given at second session; advice to practice unsupported sitting at home (06/19/2018); 07/08/18: Performing at home, 6/29: needs assistance but is doing exercise program regularly; 08/22/18: adherent;    Time  2    Period  Weeks    Status  Achieved    Target Date  06/26/18        PT Long Term Goals - 02/12/19 1634      PT LONG TERM GOAL #1   Title  Patient will complete all bed mobility with min A to improve functional independence for getting in and out of bed and adjusting in bed.     Baseline  max A - total A reported by family (06/19/2018); 07/08/18: still requires  maxA, dtr reports improvement in rolling since starting therapy; 6/23: min A to supervision in clinic; not doing at home right; 8/5: Pt's daughter states that his rolling has improved, but still has difficulty with all other bed mobility; 10/23/18: pts daughter states that she is doing about 35% of the work if he is in proper position, he does uses the bed rails to help roll;  using Hoyer for coming to sit EOB; 10/12: pts daughter states that she is doing about 30%  of the work if he is in proper position, he does uses the bed rails to help roll, but it is improving;  using Hoyer for coming to sit EOB, 10/29: min A for sit to sidelying, rolling supervision, able to transition sidelying to sit with HHA;    Time  12    Period  Weeks    Status  Achieved      PT LONG TERM GOAL #2   Title  Patient will complete sit <> stand transfer chair to chair with Min A and LRAD to improve functional independence for household and community mobility.     Baseline  variable min-modA for STS, still requires elevated surface height to 27"    Time  12    Period  Weeks    Status  On-going    Target Date  02/27/19      PT LONG TERM GOAL #3   Title  Patient will navigate power w/c with min A x 100 feet to improve mobility for household and short community distances.    Baseline  required total A (06/12/2018); able to roll 20 feet with min A (06/20/2018); 07/08/18: Pt can go approximately 10' without assist per family;  10/23/2018: pt's daughter states that his power WC mobility has become 100% better with the new hand control, slowed speed, and with practice; 10/12: pt's daughter reports that he can perform household WC mobility and limited community ambulation with minA    Time  12    Period  Weeks    Status  Achieved      PT LONG TERM GOAL #4   Title  Patient will complete car transfers W/C to family SUV with min A using LRAD to improve his abilty to participate in community activities.     Baseline  unable to complete car  transfers at all (06/12/2018); unable to complete car transfers at all (06/19/2018).; 07/08/18: Unable to perform at this time; 09/11/18: unable to perform at this time; 10/23/2018: unable to complete car transfers at all; 10/12:  unable to complete car transfers at all, 10/29: unable; 01/13/19: unable;    Time  12    Period  Weeks    Status  On-going    Target Date  02/27/19      PT LONG TERM GOAL #5   Title  Patient will ambulate at least 150 feet with mod I using LRAD to improve mobility for household and community distances.     Time  12    Period  Weeks    Status  Deferred    Target Date  02/27/19      PT LONG TERM GOAL #6   Title  Patient will be able to safety navigate up/down ramp with power chair, exhibiting good safety awareness, mod I to safely enter/exit his home.    Time  12    Period  Weeks    Status  On-going    Target Date  02/27/19      PT LONG TERM GOAL #8   Title  Patient will increase LLE quad strength to 3+/5 to improve functional strength for standing and mobility;    Baseline  10/28: 2+/5; 01/13/19: 2+/5    Time  12    Period  Weeks    Status  On-going    Target Date  02/27/19            Plan - 02/26/19 1435    Clinical Impression Statement  Patient motivated and participated well within  session. He required increased assistance for sit<>stand transfers but was able to progress to increased exercise in standing with better LE muscle activation. Patient does fatigue quickly. Vitals monitored and BP was a little low. Patient will be following up with neurologist regarding low BP and concern for need of continued BP meds. Patient would benefit from additional skilled PT Intervention to improve strength and mobiltiy;    Comorbidities  Relevant past medical history and comorbidities include long term steroid use for lupus, CKD, chronic osteomyelitis, cervical spine stenosis, BPH, APS, alcohol abuse, peripheral artery disease, depression, GERD, obstructive sleep apnea,  HTN, IBS, IgA deficiency, lumbar radiculopathy, RA, Stroke, toxic maculopathy in both eyes, systemic lupus erythematosus related syndrome, cardiac catheterization, carotid endarterectomy, cervical laminectomy, coronary angioplasty, Fusion C5-C7, knee arthroplasty, lumbar surgery.    Rehab Potential  Fair    PT Frequency  3x / week    PT Duration  12 weeks    PT Treatment/Interventions  ADLs/Self Care Home Management;Electrical Stimulation;Moist Heat;Cryotherapy;Gait training;Stair training;Functional mobility training;Therapeutic exercise;Balance training;Neuromuscular re-education;Cognitive remediation;Patient/family education;Orthotic Fit/Training;Wheelchair mobility training;Manual techniques;Passive range of motion;Energy conservation;Joint Manipulations    PT Next Visit Plan  Work on strength, sitting/standing balance, functional activities such as rolling, bed mobility, transfers; caregiver training    PT Home Exercise Plan  no updates this session    Consulted and Agree with Plan of Care  Patient;Family member/caregiver    Family Member Consulted  daughter [name]       Patient will benefit from skilled therapeutic intervention in order to improve the following deficits and impairments:  Abnormal gait, Decreased activity tolerance, Decreased cognition, Decreased endurance, Decreased knowledge of use of DME, Decreased range of motion, Decreased skin integrity, Decreased strength, Impaired perceived functional ability, Impaired sensation, Impaired UE functional use, Improper body mechanics, Pain, Cardiopulmonary status limiting activity, Decreased balance, Decreased coordination, Decreased mobility, Difficulty walking, Impaired tone, Postural dysfunction  Visit Diagnosis: Muscle weakness (generalized)  Difficulty in walking, not elsewhere classified  Repeated falls     Problem List Patient Active Problem List   Diagnosis Date Noted  . Sepsis ([place]) 10/18/2018    Heberto Sturdevant PT,  DPT 02/26/2019, 2:50 PM  [place] MAIN [facility] SERVICES [address] [place], [place], [address] Phone: [phone]   Fax:  [phone]  Name: Kenneth Summers MRN: [id number] Date of Birth: 1945/03/26

## 2019-02-27 ENCOUNTER — Other Ambulatory Visit: Payer: Self-pay

## 2019-02-27 ENCOUNTER — Ambulatory Visit: Payer: Medicare Other | Admitting: Physical Therapy

## 2019-02-27 ENCOUNTER — Encounter: Payer: Self-pay | Admitting: Physical Therapy

## 2019-02-27 ENCOUNTER — Encounter: Payer: Self-pay | Admitting: Occupational Therapy

## 2019-02-27 ENCOUNTER — Ambulatory Visit: Payer: Medicare Other | Admitting: Occupational Therapy

## 2019-02-27 DIAGNOSIS — R278 Other lack of coordination: Secondary | ICD-10-CM

## 2019-02-27 DIAGNOSIS — M6281 Muscle weakness (generalized): Secondary | ICD-10-CM

## 2019-02-27 DIAGNOSIS — R262 Difficulty in walking, not elsewhere classified: Secondary | ICD-10-CM

## 2019-02-27 DIAGNOSIS — R296 Repeated falls: Secondary | ICD-10-CM

## 2019-02-27 NOTE — Therapy (Signed)
[place] MAIN [facility] SERVICES [address] [place], [place], [address] Phone: [phone]   Fax:  [phone]  Occupational Therapy Treatment  Patient Details  Name: Kenneth Summers MRN: [id number] Date of Birth: 07-11-1945 No data recorded  Encounter Date: 02/27/2019  OT End of Session - 02/27/19 1453    Visit Number  57    Number of Visits  108    Date for OT Re-Evaluation  04/07/19    Authorization Type  Progress report period starting 12/26/2018    OT Start Time  1300    OT Stop Time  1345    OT Time Calculation (min)  45 min    Activity Tolerance  Patient tolerated treatment well;Patient limited by fatigue    Behavior During Therapy  [facility] for tasks assessed/performed       Past Medical History:  Diagnosis Date  . Alcohol abuse   . APS (antiphospholipid syndrome) ([place])   . BPH (benign prostatic hyperplasia)   . CAD (coronary artery disease)   . Cellulitis   . Cervical spinal stenosis   . Chronic osteomyelitis ([place])   . CKD (chronic kidney disease)   . CKD (chronic kidney disease)   . Clostridium difficile diarrhea   . Depression   . Diplopia   . Fatigue   . GERD (gastroesophageal reflux disease)   . Hyperlipemia   . Hypertension   . Hypovitaminosis D   . IBS (irritable bowel syndrome)   . 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 Insomnia   . Left lumbosacral radiculopathy   . Moderate obstructive sleep apnea   . Osteomyelitis of foot ([place])   . PAD (peripheral artery disease) ([place])   . Pruritus   . RA (rheumatoid arthritis) ([place])   . Radiculopathy   . Restless legs syndrome   . RLS (restless legs syndrome)   . SLE (systemic lupus erythematosus related syndrome) ([place])   . Stroke ([place])   . Toxic maculopathy of both eyes     Past Surgical History:  Procedure Laterality Date  . CARDIAC CATHETERIZATION    . CAROTID ENDARTERECTOMY    . CERVICAL LAMINECTOMY    . CORONARY ANGIOPLASTY    . FRACTURE SURGERY    . fusion C5-6-7    .  HERNIA REPAIR    . KNEE ARTHROSCOPY    . TONSILLECTOMY      There were no vitals filed for this visit.  Subjective Assessment - 02/27/19 1452    Subjective   Pt. reports that he is doing well today.    Patient is accompanied by:  Family member    Pertinent History  Pt. is a 74 y.o. 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 Pt. PMHx includes: Multiple Falls, Lupus, DJD, Renal Abscess, CVA. Pt. resides with his wife. Pt.'s wife and daughter assist with ADLs. Pt. has caregivers in  for 2 hours a day, 6 days a week. Pt. received Rehab services in acute care, at SNF for STR, and [place] services. Pt. is retired from [employer] for [employer] and [employer].    Patient Stated Goals  To regain use of his left UE, and do more for himself.    Currently in Pain?  No/denies      OT TREATMENT    Neuro muscular re-education:  Pt. worked on grasping 1" resistive cubes with the left hand alternating thumb opposition to the tip of the 2nd digit while the board is  placed at a vertical angle. Pt. worked on pressing the cubes back into place while alternating isolated 2nd digit extension. Pt. required fewer cues to use his left hand.  Therapeutic Exercise:  Pt. performed 2# dowel ex. for UE strengthening secondary to weakness. Bilateral shoulder flexion, chest press, circular, and horizontal V shaped patterns. 3# dumbbell ex. for forearm supination/pronation. Pt. utilized a Geologist, engineering for visual cues, and form.   Response to Treatment:  Pt. continues to present with limited LUE strength, motor control, and [facility] skills. Pt. tolerated ther. ex well, and required verbal cues, and visual using the full length mirror for proper technique, and form. Pt. continues to work on improving  LUE functioning in order to improve, and increase LUE engagement with ADLs, and IADL tasks.                          OT Education - 02/27/19 1558    Education Details  LUE  functioning    Person(s) Educated  Patient    Methods  Explanation;Demonstration    Comprehension  Returned demonstration;Tactile cues required;Verbalized understanding;Need further instruction          OT Long Term Goals - 01/27/19 1404      OT LONG TERM GOAL #1   Title  Pt. will increase left shoulder flexion AROM by 10 degrees to access cabinet/shelf.    Baseline  Pt. is improving with LUE ROM, and functional reaching.    Time  12    Period  Weeks    Status  On-going    Target Date  04/07/19      OT LONG TERM GOAL #2   Title  Pt. will donn a shirt with Supervision.    Baseline  MinA donning a zip down jacket    Time  12    Period  Weeks    Status  On-going    Target Date  04/07/19      OT LONG TERM GOAL #4   Title  Pt. will improve left grip strength by 10# to be able to open a jar/container.    Time  12    Period  Weeks    Status  On-going    Target Date  04/07/19      OT LONG TERM GOAL #5   Title  Pt. will improve left hand [facility] skills to be able to assist with buttoning/zipping.    Baseline  Pt. has made excellent progress with bilateral [facility] skills. Pt. continues to work on improving bilateral [facility] skills for buttoning/zipping    Time  12    Period  Weeks    Status  On-going    Target Date  04/07/19      OT LONG TERM GOAL #6   Title  Pt. will demonstrate visual conmpensatory strategies during 100% of the time during ADLs    Baseline  Pt. requires cues at times for left sides awareness.    Time  12    Period  Weeks    Status  On-going    Target Date  04/07/19      OT LONG TERM GOAL #8   Title  Pt. will accurately identify potential safety hazard using good safety awareness, and judgement 100% for ADLs, and IADLs.    Baseline  Pt. continues to be limited    Time  12    Period  Weeks    Status  On-going    Target Date  04/07/19  OT LONG TERM GOAL  #9   TITLE  Pt. will  navigate his w/c around obstacles with Supervision and 100% accuracy    Baseline   Pt. continues to require consistent cuing for obstacle on the left    Time  12    Period  Weeks    Status  On-going    Target Date  04/07/19      OT LONG TERM GOAL  #11   TITLE  Pt. will increase BUE strength by 41mm grades in preparation for ADL transfers.    Baseline  Limited BUE strength    Time  12    Period  Weeks    Status  On-going    Target Date  04/07/19            Plan - 02/27/19 1456    Clinical Impression Statement Pt. continues to present with limited LUE strength, motor control, and [facility] skills. Pt. tolerated ther. ex well, and required verbal cues, and visual using the full length mirror for proper technique, and form. Pt. continues to work on improving  LUE functioning in order to improve, and increase LUE engagement with ADLs, and IADL tasks.    OT Occupational Profile and History  Problem Focused Assessment - Including review of records relating to presenting problem    Occupational performance deficits (Please refer to evaluation for details):  ADL's    Body Structure / Function / Physical Skills  UE functional use;Coordination;FMC;Dexterity;Strength;ROM    Cognitive Skills  Attention;Memory;Emotional;Problem Solve;Safety Awareness    Rehab Potential  Good    Clinical Decision Making  Several treatment options, min-mod task modification necessary    Comorbidities Affecting Occupational Performance:  Presence of comorbidities impacting occupational performance    Comorbidities impacting occupational performance description:  Phyical, cognitive, visual,  medical comorbidities    Modification or Assistance to Complete Evaluation   No modification of tasks or assist necessary to complete eval    OT Frequency  2x / week    OT Duration  12 weeks    OT Treatment/Interventions  Self-care/ADL training;DME and/or AE instruction;Therapeutic exercise;Therapeutic activities;Moist Heat;Cognitive remediation/compensation;Neuromuscular education;Visual/perceptual  remediation/compensation;Coping strategies training;Patient/family education;Passive range of motion;Psychosocial skills training;Energy conservation;Functional Mobility Training    Recommended Other Services  PT    Consulted and Agree with Plan of Care  Patient    Family Member Consulted  Daughter [name]       Patient will benefit from skilled therapeutic intervention in order to improve the following deficits and impairments:   Body Structure / Function / Physical Skills: UE functional use, Coordination, FMC, Dexterity, Strength, ROM Cognitive Skills: Attention, Memory, Emotional, Problem Solve, Safety Awareness     Visit Diagnosis: Muscle weakness (generalized)  Other lack of coordination    Problem List Patient Active Problem List   Diagnosis Date Noted  . Sepsis ([place]) 10/18/2018    Harrel Carina, MS, OTR/L 02/27/2019, 4:08 PM  [place] MAIN [facility] SERVICES [address] [place], [place], [address] Phone: [phone]   Fax:  [phone]  Name: Kenneth Summers MRN: [id number] Date of Birth: Mar 03, 1945

## 2019-02-27 NOTE — Therapy (Signed)
Kenneth Summers MAIN [facility] SERVICES [address] [place], [place], [address] Phone: [phone]   Fax:  [phone]  Physical Therapy Treatment  Patient Details  Name: Kenneth Summers MRN: [id number] Date of Birth: 20-Apr-1945 Referring Provider (PT): Dr. [name], Guy Begin   Encounter Date: 02/27/2019  PT End of Session - 02/27/19 1536    Visit Number  93    Number of Visits  108    Date for PT Re-Evaluation  02/27/19    Authorization Type  Medicare reporting period starting 09/11/18    Authorization Time Period  goals last assessed 02/12/19    PT Start Time  1345    PT Stop Time  1430    PT Time Calculation (min)  45 min    Equipment Utilized During Treatment  Gait belt    Activity Tolerance  Patient tolerated treatment well;No increased pain    Behavior During Therapy  WFL for tasks assessed/performed       Past Medical History:  Diagnosis Date  . Alcohol abuse   . APS (antiphospholipid syndrome) ([place])   . BPH (benign prostatic hyperplasia)   . CAD (coronary artery disease)   . Cellulitis   . Cervical spinal stenosis   . Chronic osteomyelitis ([place])   . CKD (chronic kidney disease)   . CKD (chronic kidney disease)   . Clostridium difficile diarrhea   . Depression   . Diplopia   . Fatigue   . GERD (gastroesophageal reflux disease)   . Hyperlipemia   . Hypertension   . Hypovitaminosis D   . IBS (irritable bowel syndrome)   . 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 Insomnia   . Left lumbosacral radiculopathy   . Moderate obstructive sleep apnea   . Osteomyelitis of foot ([place])   . PAD (peripheral artery disease) ([place])   . Pruritus   . RA (rheumatoid arthritis) ([place])   . Radiculopathy   . Restless legs syndrome   . RLS (restless legs syndrome)   . SLE (systemic lupus erythematosus related syndrome) ([place])   . Stroke ([place])   . Toxic maculopathy of both eyes     Past Surgical History:  Procedure Laterality Date  . CARDIAC  CATHETERIZATION    . CAROTID ENDARTERECTOMY    . CERVICAL LAMINECTOMY    . CORONARY ANGIOPLASTY    . FRACTURE SURGERY    . fusion C5-6-7    . HERNIA REPAIR    . KNEE ARTHROSCOPY    . TONSILLECTOMY      There were no vitals filed for this visit.  Subjective Assessment - 02/27/19 1535    Subjective  Patient reports doing well. Denies any pain. Reports moderate fatigue;    Patient is accompained by:  Family member    Pertinent History  Patient is a 74 y.o. male who presents to outpatient physical therapy with a referral for medical diagnosis of CVA. This patient's chief complaints consist of left hemiplegia and overall deconditioning leading to the following functional deficits: dependent for ADLs, IADLs, unable to transfer to car, dependent transfers at home with hoyer lift, unable to walk or stand without significant assistance, difficulty with W/C navigation..    Currently in Pain?  No/denies    Multiple Pain Sites  No            TREATMENT:  Transferred sit<>Stand in parallel bars (seat adjusted to 27 inch from floor height) x10 reps throughout session; Initially he required min A for forward weight shift  and cues for proper hand placement. However was able to progress to supervision with sit<>stand transfers after 5th repetition.   Patient was able to stand for 60 sec for at least 1 repetition with min VCs for weight shift and increase hip/trunk extension for better weight bearing in BLE, required supervision for safety;   Progressed to sit<>Stand transfer with standing in parallel bars:             Mini squat 2x10 reps with min A for proper weight shift as patient exhibits heavy posterior lean; Patient required tactile and verbal cues for weight shift to neutral position and then initiated mini squat. With initial standing, patient often shifted to RLE;  Seated: Yellow tband around BLE: Hip flexion march x10 with good AROM noted in BLE.  During each seated rest break,  educated patient in diaphragmatic breathing (x5 reps) for better breath support and to reduce dyspnea on exertion.   In Standing: Hip flexion march  able to complete 5 marches RLE with min A and LLE knee block into extension for stability; x5 reps on LLE with min A to help with ROM;    Patient required mod A to scoot back in chair at end of session due to increased fatigue  Response to treatment: Tolerated well. With all transfers patient required mod VCs for forward weight shift and to increase hip/knee extension in stance. Patient was able to progress transfers from min A to supervision with better LE Muscle activation to stand.    Patient heavily fatigued at end of session.                     PT Education - 02/27/19 1535    Education Details  transfers/HEP    Person(s) Educated  Patient;Child(ren)    Methods  Explanation;Demonstration;Verbal cues    Comprehension  Verbalized understanding;Returned demonstration;Verbal cues required;Need further instruction       PT Short Term Goals - 01/13/19 1721      PT SHORT TERM GOAL #1   Title  Be independent with initial home exercise program for self-management of symptoms.    Baseline  HEP to be given at second session; advice to practice unsupported sitting at home (06/19/2018); 07/08/18: Performing at home, 6/29: needs assistance but is doing exercise program regularly; 08/22/18: adherent;    Time  2    Period  Weeks    Status  Achieved    Target Date  06/26/18        PT Long Term Goals - 02/12/19 1634      PT LONG TERM GOAL #1   Title  Patient will complete all bed mobility with min A to improve functional independence for getting in and out of bed and adjusting in bed.     Baseline  max A - total A reported by family (06/19/2018); 07/08/18: still requires maxA, dtr reports improvement in rolling since starting therapy; 6/23: min A to supervision in clinic; not doing at home right; 8/5: Pt's daughter states that  his rolling has improved, but still has difficulty with all other bed mobility; 10/23/18: pts daughter states that she is doing about 35% of the work if he is in proper position, he does uses the bed rails to help roll;  using Hoyer for coming to sit EOB; 10/12: pts daughter states that she is doing about 30% of the work if he is in proper position, he does uses the bed rails to help roll, but it is  improving;  using Hoyer for coming to sit EOB, 10/29: min A for sit to sidelying, rolling supervision, able to transition sidelying to sit with HHA;    Time  12    Period  Weeks    Status  Achieved      PT LONG TERM GOAL #2   Title  Patient will complete sit <> stand transfer chair to chair with Min A and LRAD to improve functional independence for household and community mobility.     Baseline  variable min-modA for STS, still requires elevated surface height to 27"    Time  12    Period  Weeks    Status  On-going    Target Date  02/27/19      PT LONG TERM GOAL #3   Title  Patient will navigate power w/c with min A x 100 feet to improve mobility for household and short community distances.    Baseline  required total A (06/12/2018); able to roll 20 feet with min A (06/20/2018); 07/08/18: Pt can go approximately 10' without assist per family;  10/23/2018: pt's daughter states that his power WC mobility has become 100% better with the new hand control, slowed speed, and with practice; 10/12: pt's daughter reports that he can perform household WC mobility and limited community ambulation with minA    Time  12    Period  Weeks    Status  Achieved      PT LONG TERM GOAL #4   Title  Patient will complete car transfers W/C to family SUV with min A using LRAD to improve his abilty to participate in community activities.     Baseline  unable to complete car transfers at all (06/12/2018); unable to complete car transfers at all (06/19/2018).; 07/08/18: Unable to perform at this time; 09/11/18: unable to perform at this  time; 10/23/2018: unable to complete car transfers at all; 10/12:  unable to complete car transfers at all, 10/29: unable; 01/13/19: unable;    Time  12    Period  Weeks    Status  On-going    Target Date  02/27/19      PT LONG TERM GOAL #5   Title  Patient will ambulate at least 150 feet with mod I using LRAD to improve mobility for household and community distances.     Time  12    Period  Weeks    Status  Deferred    Target Date  02/27/19      PT LONG TERM GOAL #6   Title  Patient will be able to safety navigate up/down ramp with power chair, exhibiting good safety awareness, mod I to safely enter/exit his home.    Time  12    Period  Weeks    Status  On-going    Target Date  02/27/19      PT LONG TERM GOAL #8   Title  Patient will increase LLE quad strength to 3+/5 to improve functional strength for standing and mobility;    Baseline  10/28: 2+/5; 01/13/19: 2+/5    Time  12    Period  Weeks    Status  On-going    Target Date  02/27/19            Plan - 02/27/19 1536    Clinical Impression Statement  Patient motivated and participated well within session. He continues to progress with sit<>stand transfers progressing to supervision this session in parallel bars. Patient also is progressing in  LE strength being able to do light exercise in standing. he does require min A in standing for proper weight shift and positioning for optimal muscle activation. Patient would benefit from additional skilled PT Intervention to improve strength, balance and mobility;    Comorbidities  Relevant past medical history and comorbidities include long term steroid use for lupus, CKD, chronic osteomyelitis, cervical spine stenosis, BPH, APS, alcohol abuse, peripheral artery disease, depression, GERD, obstructive sleep apnea, HTN, IBS, IgA deficiency, lumbar radiculopathy, RA, Stroke, toxic maculopathy in both eyes, systemic lupus erythematosus related syndrome, cardiac catheterization, carotid  endarterectomy, cervical laminectomy, coronary angioplasty, Fusion C5-C7, knee arthroplasty, lumbar surgery.    Rehab Potential  Fair    PT Frequency  3x / week    PT Duration  12 weeks    PT Treatment/Interventions  ADLs/Self Care Home Management;Electrical Stimulation;Moist Heat;Cryotherapy;Gait training;Stair training;Functional mobility training;Therapeutic exercise;Balance training;Neuromuscular re-education;Cognitive remediation;Patient/family education;Orthotic Fit/Training;Wheelchair mobility training;Manual techniques;Passive range of motion;Energy conservation;Joint Manipulations    PT Next Visit Plan  Work on strength, sitting/standing balance, functional activities such as rolling, bed mobility, transfers; caregiver training    PT Home Exercise Plan  no updates this session    Consulted and Agree with Plan of Care  Patient;Family member/caregiver    Family Member Consulted  daughter [name]       Patient will benefit from skilled therapeutic intervention in order to improve the following deficits and impairments:  Abnormal gait, Decreased activity tolerance, Decreased cognition, Decreased endurance, Decreased knowledge of use of DME, Decreased range of motion, Decreased skin integrity, Decreased strength, Impaired perceived functional ability, Impaired sensation, Impaired UE functional use, Improper body mechanics, Pain, Cardiopulmonary status limiting activity, Decreased balance, Decreased coordination, Decreased mobility, Difficulty walking, Impaired tone, Postural dysfunction  Visit Diagnosis: Muscle weakness (generalized)  Other lack of coordination  Difficulty in walking, not elsewhere classified  Repeated falls     Problem List Patient Active Problem List   Diagnosis Date Noted  . Sepsis ([place]) 10/18/2018    Kailani Brass PT, DPT 02/27/2019, 3:38 PM  [place] MAIN [facility] SERVICES [address] [place], [place],  [address] Phone: [phone]   Fax:  [phone]  Name: Kenneth Summers MRN: [id number] Date of Birth: [birth year]

## 2019-03-03 ENCOUNTER — Ambulatory Visit: Payer: Medicare Other | Admitting: Physical Therapy

## 2019-03-03 ENCOUNTER — Other Ambulatory Visit: Payer: Self-pay

## 2019-03-03 ENCOUNTER — Ambulatory Visit: Payer: Medicare Other | Admitting: Occupational Therapy

## 2019-03-03 DIAGNOSIS — M6281 Muscle weakness (generalized): Secondary | ICD-10-CM

## 2019-03-03 DIAGNOSIS — R262 Difficulty in walking, not elsewhere classified: Secondary | ICD-10-CM

## 2019-03-03 DIAGNOSIS — R278 Other lack of coordination: Secondary | ICD-10-CM

## 2019-03-03 DIAGNOSIS — R296 Repeated falls: Secondary | ICD-10-CM

## 2019-03-04 ENCOUNTER — Encounter: Payer: Self-pay | Admitting: Occupational Therapy

## 2019-03-04 ENCOUNTER — Encounter: Payer: Self-pay | Admitting: Physical Therapy

## 2019-03-04 NOTE — Therapy (Signed)
[place] MAIN [facility] SERVICES [address] [place], [place], [address] Phone: [phone]   Fax:  [phone]  Occupational Therapy Treatment  Patient Details  Name: Kenneth Summers MRN: [id number] Date of Birth: [birth year] No data recorded  Encounter Date: 03/03/2019  OT End of Session - 03/04/19 0841    Visit Number  90    Number of Visits  108    Date for OT Re-Evaluation  04/07/19    Authorization Type  Progress report period starting 12/26/2018    OT Start Time  1345    OT Stop Time  1430    OT Time Calculation (min)  45 min    Activity Tolerance  Patient tolerated treatment well;Patient limited by fatigue    Behavior During Therapy  [facility] for tasks assessed/performed       Past Medical History:  Diagnosis Date  . Alcohol abuse   . APS (antiphospholipid syndrome) ([place])   . BPH (benign prostatic hyperplasia)   . CAD (coronary artery disease)   . Cellulitis   . Cervical spinal stenosis   . Chronic osteomyelitis ([place])   . CKD (chronic kidney disease)   . CKD (chronic kidney disease)   . Clostridium difficile diarrhea   . Depression   . Diplopia   . Fatigue   . GERD (gastroesophageal reflux disease)   . Hyperlipemia   . Hypertension   . Hypovitaminosis D   . IBS (irritable bowel syndrome)   . 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 Insomnia   . Left lumbosacral radiculopathy   . Moderate obstructive sleep apnea   . Osteomyelitis of foot ([place])   . PAD (peripheral artery disease) ([place])   . Pruritus   . RA (rheumatoid arthritis) ([place])   . Radiculopathy   . Restless legs syndrome   . RLS (restless legs syndrome)   . SLE (systemic lupus erythematosus related syndrome) ([place])   . Stroke ([place])   . Toxic maculopathy of both eyes     Past Surgical History:  Procedure Laterality Date  . CARDIAC CATHETERIZATION    . CAROTID ENDARTERECTOMY    . CERVICAL LAMINECTOMY    . CORONARY ANGIOPLASTY    . FRACTURE SURGERY    . fusion C5-6-7    .  HERNIA REPAIR    . KNEE ARTHROSCOPY    . TONSILLECTOMY      There were no vitals filed for this visit.  Subjective Assessment - 03/04/19 0839    Subjective   Pt. reports being very tired following car transfer training with PT.    Patient is accompanied by:  Family member    Pertinent History  Pt. is a 74 y.o. 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 Pt. PMHx includes: Multiple Falls, Lupus, DJD, Renal Abscess, CVA. Pt. resides with his wife. Pt.'s wife and daughter assist with ADLs. Pt. has caregivers in  for 2 hours a day, 6 days a week. Pt. received Rehab services in acute care, at SNF for STR, and [facility] services. Pt. is retired from [employer] for [employer] and [employer].    Currently in Pain?  No/denies      OT TREATMENT    Therapeutic Exercise:  Pt. performed 2# dowel ex. For UE strengthening secondary to weakness. Bilateral shoulder flexion, chest press, circular patterns, v shaped pattern, and elbow flexion/extension were performed.  Selfcare:  Pt. worked on UE dressing skills donning a winter coat. Pt. required mod-maxA  with increased cues for the right sleeve, and elbow.   Response to Treatment:  Pt. required Mod-MaxA donning his winter coat requiring increased assist to donn, and doff the right sleeve over the elbow secondary to coat fit/style/size. Pt. required cues as pt. initially donns the jacket in reverse direction. Pt. continues to tolerate UE exercises well with verbal cues, and visual cues using the mirror for form. Pt. required rest breaks during the session.                            OT Education - 03/04/19 0840    Education Details  LUE functioning    Person(s) Educated  Patient    Methods  Explanation;Demonstration    Comprehension  Returned demonstration;Tactile cues required;Verbalized understanding;Need further instruction          OT Long Term Goals - 01/27/19 1404      OT LONG  TERM GOAL #1   Title  Pt. will increase left shoulder flexion AROM by 10 degrees to access cabinet/shelf.    Baseline  Pt. is improving with LUE ROM, and functional reaching.    Time  12    Period  Weeks    Status  On-going    Target Date  04/07/19      OT LONG TERM GOAL #2   Title  Pt. will donn a shirt with Supervision.    Baseline  MinA donning a zip down jacket    Time  12    Period  Weeks    Status  On-going    Target Date  04/07/19      OT LONG TERM GOAL #4   Title  Pt. will improve left grip strength by 10# to be able to open a jar/container.    Time  12    Period  Weeks    Status  On-going    Target Date  04/07/19      OT LONG TERM GOAL #5   Title  Pt. will improve left hand [facility] skills to be able to assist with buttoning/zipping.    Baseline  Pt. has made excellent progress with bilateral [facility] skills. Pt. continues to work on improving bilateral [facility] skills for buttoning/zipping    Time  12    Period  Weeks    Status  On-going    Target Date  04/07/19      OT LONG TERM GOAL #6   Title  Pt. will demonstrate visual conmpensatory strategies during 100% of the time during ADLs    Baseline  Pt. requires cues at times for left sides awareness.    Time  12    Period  Weeks    Status  On-going    Target Date  04/07/19      OT LONG TERM GOAL #8   Title  Pt. will accurately identify potential safety hazard using good safety awareness, and judgement 100% for ADLs, and IADLs.    Baseline  Pt. continues to be limited    Time  12    Period  Weeks    Status  On-going    Target Date  04/07/19      OT LONG TERM GOAL  #9   TITLE  Pt. will  navigate his w/c around obstacles with Supervision and 100% accuracy    Baseline  Pt. continues to require consistent cuing for obstacle on the left    Time  12    Period  Weeks    Status  On-going    Target Date  04/07/19      OT LONG TERM GOAL  #11   TITLE  Pt. will increase BUE strength by 42mm grades in preparation for ADL transfers.     Baseline  Limited BUE strength    Time  12    Period  Weeks    Status  On-going    Target Date  04/07/19            Plan - 03/04/19 0841    Clinical Impression Statement  Pt. required Mod-MaxA donning his winter coat requiring increased assist to donn, and doff the right sleeve over the elbow secondary to coat fit/style/size. Pt. required cues as pt. initially donns the jacket in reverse direction. Pt. continues to tolerate UE exercises well with verbal cues, and visual cues using the mirror for form. Pt. required rest breaks during the session.    OT Occupational Profile and History  Problem Focused Assessment - Including review of records relating to presenting problem    Occupational performance deficits (Please refer to evaluation for details):  ADL's    Body Structure / Function / Physical Skills  UE functional use;Coordination;FMC;Dexterity;Strength;ROM    Cognitive Skills  Attention;Memory;Emotional;Problem Solve;Safety Awareness    Clinical Decision Making  Several treatment options, min-mod task modification necessary    OT Frequency  2x / week    OT Duration  12 weeks    OT Treatment/Interventions  Self-care/ADL training;DME and/or AE instruction;Therapeutic exercise;Therapeutic activities;Moist Heat;Cognitive remediation/compensation;Neuromuscular education;Visual/perceptual remediation/compensation;Coping strategies training;Patient/family education;Passive range of motion;Psychosocial skills training;Energy conservation;Functional Mobility Training    Family Member Consulted  Daughter [name]       Patient will benefit from skilled therapeutic intervention in order to improve the following deficits and impairments:   Body Structure / Function / Physical Skills: UE functional use, Coordination, FMC, Dexterity, Strength, ROM Cognitive Skills: Attention, Memory, Emotional, Problem Solve, Safety Awareness     Visit Diagnosis: Muscle weakness (generalized)    Problem  List Patient Active Problem List   Diagnosis Date Noted  . Sepsis ([place]) 10/18/2018    Harrel Carina, MS, OTR/L 03/04/2019, 9:06 AM  [place] MAIN [facility] SERVICES [address] [place], [place], [address] Phone: [phone]   Fax:  [phone]  Name: Kenneth Summers MRN: [id number] Date of Birth: 1945/02/18

## 2019-03-04 NOTE — Therapy (Signed)
[place] MAIN [facility] SERVICES [address] [place], [place], [address] Phone: [phone]   Fax:  [phone]  Physical Therapy Treatment  Patient Details  Name: Kenneth Summers MRN: [id number] Date of Birth: 02-11-1945 Referring Provider (PT): Dr. [name], Guy Begin   Encounter Date: 03/03/2019  PT End of Session - 03/04/19 1048    Visit Number  [id number]    Number of Visits  108    Date for PT Re-Evaluation  02/27/19    Authorization Type  Medicare reporting period starting 09/11/18    Authorization Time Period  goals last assessed 02/12/19    PT Start Time  1255    PT Stop Time  1350    PT Time Calculation (min)  55 min    Equipment Utilized During Treatment  Gait belt    Activity Tolerance  Patient tolerated treatment well;No increased pain    Behavior During Therapy  WFL for tasks assessed/performed       Past Medical History:  Diagnosis Date  . Alcohol abuse   . APS (antiphospholipid syndrome) ([place])   . BPH (benign prostatic hyperplasia)   . CAD (coronary artery disease)   . Cellulitis   . Cervical spinal stenosis   . Chronic osteomyelitis ([place])   . CKD (chronic kidney disease)   . CKD (chronic kidney disease)   . Clostridium difficile diarrhea   . Depression   . Diplopia   . Fatigue   . GERD (gastroesophageal reflux disease)   . Hyperlipemia   . Hypertension   . Hypovitaminosis D   . IBS (irritable bowel syndrome)   . 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 Insomnia   . Left lumbosacral radiculopathy   . Moderate obstructive sleep apnea   . Osteomyelitis of foot ([place])   . PAD (peripheral artery disease) ([place])   . Pruritus   . RA (rheumatoid arthritis) ([place])   . Radiculopathy   . Restless legs syndrome   . RLS (restless legs syndrome)   . SLE (systemic lupus erythematosus related syndrome) ([place])   . Stroke ([place])   . Toxic maculopathy of both eyes     Past Surgical History:  Procedure Laterality Date  . CARDIAC  CATHETERIZATION    . CAROTID ENDARTERECTOMY    . CERVICAL LAMINECTOMY    . CORONARY ANGIOPLASTY    . FRACTURE SURGERY    . fusion C5-6-7    . HERNIA REPAIR    . KNEE ARTHROSCOPY    . TONSILLECTOMY      There were no vitals filed for this visit.  Subjective Assessment - 03/04/19 1047    Subjective  Patient reports doing well; Denies any pain; Reports some fatigue but not much.    Patient is accompained by:  Family member    Pertinent History  Patient is a 74 y.o. male who presents to outpatient physical therapy with a referral for medical diagnosis of CVA. This patient's chief complaints consist of left hemiplegia and overall deconditioning leading to the following functional deficits: dependent for ADLs, IADLs, unable to transfer to car, dependent transfers at home with hoyer lift, unable to walk or stand without significant assistance, difficulty with W/C navigation..    Currently in Pain?  No/denies          TREATMENT:  Patient expressed desire to complete a car transfer in order to be able to get a COVID vaccination.   Attempted slide board transfer but patient unable to complete due to difficulty achieving  proper positioning of wheelchair in proximity to car seat. He is able to adjust height of power chair to assist with slide board, but then his feet are off the floor, in order to keep feet supported for safety, he has to keep them on the leg rests, but that prevents patient from sliding close enough to car seat due to proximity of door.   Patient then attempted sit<>Stand and stand pivot to car to front passenger side. However due to weakness in LLE he had difficulty achieving full knee extension which made transfer to left side unsafe.  Patient then attempted a sit<>stand/stand pivot transfer to back drivers side seat; This allowed therapist to block LLE knee for better knee extension in standing for improved safety awareness. He required mod A +2 with patient holding onto door  to pull self up and 2nd person in back of car, helping slide hips backwards for better transfer ability;   Patient required mod A for lifting LE into car. He was able to push through UE and LE and scoot to side for neutral sitting position in back of car.  Patient heavily fatigued. Educated patient on proper sitting position and safety awareness with sitting in car.  Then instructed patient in sliding feet out of car, mod A +1 and then transferring sit<>stand and stand pivot to wheelchair. He required mod A +2 with better positioning and ease. He did required mod VCs for proper hand placement and positioning.   Following car transfer he required max A +2 for sliding back in power chair for optimal sitting position;   Response to treatment: Patient excited that he was able to successfully transfer into car- he hasn't been able to sit in a car in over a year. He did report increased fatigue but denies any pain. Patient required increased cues for proper hand placement and positioning for optimal safety awareness with transfers. He would benefit from additional skilled intervention for better safety awareness with patient and family to improve success and ease with car transfers;                      PT Education - 03/04/19 1048    Education Details  transfers/positioning;    [employer]) Educated  Patient;Child(ren)    Methods  Explanation;Tactile cues;Verbal cues    Comprehension  Verbalized understanding;Returned demonstration;Verbal cues required;Need further instruction       PT Short Term Goals - 01/13/19 1721      PT SHORT TERM GOAL #1   Title  Be independent with initial home exercise program for self-management of symptoms.    Baseline  HEP to be given at second session; advice to practice unsupported sitting at home (06/19/2018); 07/08/18: Performing at home, 6/29: needs assistance but is doing exercise program regularly; 08/22/18: adherent;    Time  2    Period  Weeks     Status  Achieved    Target Date  06/26/18        PT Long Term Goals - 02/12/19 1634      PT LONG TERM GOAL #1   Title  Patient will complete all bed mobility with min A to improve functional independence for getting in and out of bed and adjusting in bed.     Baseline  max A - total A reported by family (06/19/2018); 07/08/18: still requires maxA, dtr reports improvement in rolling since starting therapy; 6/23: min A to supervision in clinic; not doing at home right; 8/5: Pt's  daughter states that his rolling has improved, but still has difficulty with all other bed mobility; 10/23/18: pts daughter states that she is doing about 35% of the work if he is in proper position, he does uses the bed rails to help roll;  using Harrel Lemon for coming to sit EOB; 10/12: pts daughter states that she is doing about 30% of the work if he is in proper position, he does uses the bed rails to help roll, but it is improving;  using Hoyer for coming to sit EOB, 10/29: min A for sit to sidelying, rolling supervision, able to transition sidelying to sit with HHA;    Time  12    Period  Weeks    Status  Achieved      PT LONG TERM GOAL #2   Title  Patient will complete sit <> stand transfer chair to chair with Min A and LRAD to improve functional independence for household and community mobility.     Baseline  variable min-modA for STS, still requires elevated surface height to 27"    Time  12    Period  Weeks    Status  On-going    Target Date  02/27/19      PT LONG TERM GOAL #3   Title  Patient will navigate power w/c with min A x 100 feet to improve mobility for household and short community distances.    Baseline  required total A (06/12/2018); able to roll 20 feet with min A (06/20/2018); 07/08/18: Pt can go approximately 10' without assist per family;  10/23/2018: pt's daughter states that his power WC mobility has become 100% better with the new hand control, slowed speed, and with practice; 10/12: pt's daughter reports  that he can perform household WC mobility and limited community ambulation with minA    Time  12    Period  Weeks    Status  Achieved      PT LONG TERM GOAL #4   Title  Patient will complete car transfers W/C to family SUV with min A using LRAD to improve his abilty to participate in community activities.     Baseline  unable to complete car transfers at all (06/12/2018); unable to complete car transfers at all (06/19/2018).; 07/08/18: Unable to perform at this time; 09/11/18: unable to perform at this time; 10/23/2018: unable to complete car transfers at all; 10/12:  unable to complete car transfers at all, 10/29: unable; 01/13/19: unable;    Time  12    Period  Weeks    Status  On-going    Target Date  02/27/19      PT LONG TERM GOAL #5   Title  Patient will ambulate at least 150 feet with mod I using LRAD to improve mobility for household and community distances.     Time  12    Period  Weeks    Status  Deferred    Target Date  02/27/19      PT LONG TERM GOAL #6   Title  Patient will be able to safety navigate up/down ramp with power chair, exhibiting good safety awareness, mod I to safely enter/exit his home.    Time  12    Period  Weeks    Status  On-going    Target Date  02/27/19      PT LONG TERM GOAL #8   Title  Patient will increase LLE quad strength to 3+/5 to improve functional strength for standing and mobility;  Baseline  10/28: 2+/5; 01/13/19: 2+/5    Time  12    Period  Weeks    Status  On-going    Target Date  02/27/19            Plan - 03/04/19 1048    Clinical Impression Statement  Patient motivated and participated well within session. Educated patient and caregiver in safe car transfers. Patient was able to transfer to back drivers side seat which allowed therpaist to provide support to LLE knee. He required mod A +2 for stand pivot transfer to car. He was unable to complete a sliding board transfer due to difficulty achieving proper positioning of wheelchair  for safety. Patient does report increased fatigue following car transfer. He required max A +2 for repositoining in power chair due to weakness in BUE and BLE. Patient would benefit from additional skilled PT intervention for sequencing, positioning for better safety awareness with car transfers and to improve overall mobility and strength;    Comorbidities  Relevant past medical history and comorbidities include long term steroid use for lupus, CKD, chronic osteomyelitis, cervical spine stenosis, BPH, APS, alcohol abuse, peripheral artery disease, depression, GERD, obstructive sleep apnea, HTN, IBS, IgA deficiency, lumbar radiculopathy, RA, Stroke, toxic maculopathy in both eyes, systemic lupus erythematosus related syndrome, cardiac catheterization, carotid endarterectomy, cervical laminectomy, coronary angioplasty, Fusion C5-C7, knee arthroplasty, lumbar surgery.    Rehab Potential  Fair    PT Frequency  3x / week    PT Duration  12 weeks    PT Treatment/Interventions  ADLs/Self Care Home Management;Electrical Stimulation;Moist Heat;Cryotherapy;Gait training;Stair training;Functional mobility training;Therapeutic exercise;Balance training;Neuromuscular re-education;Cognitive remediation;Patient/family education;Orthotic Fit/Training;Wheelchair mobility training;Manual techniques;Passive range of motion;Energy conservation;Joint Manipulations    PT Next Visit Plan  Work on strength, sitting/standing balance, functional activities such as rolling, bed mobility, transfers; caregiver training    PT Home Exercise Plan  no updates this session    Consulted and Agree with Plan of Care  Patient;Family member/caregiver    Family Member Consulted  daughter [name]       Patient will benefit from skilled therapeutic intervention in order to improve the following deficits and impairments:  Abnormal gait, Decreased activity tolerance, Decreased cognition, Decreased endurance, Decreased knowledge of use of DME,  Decreased range of motion, Decreased skin integrity, Decreased strength, Impaired perceived functional ability, Impaired sensation, Impaired UE functional use, Improper body mechanics, Pain, Cardiopulmonary status limiting activity, Decreased balance, Decreased coordination, Decreased mobility, Difficulty walking, Impaired tone, Postural dysfunction  Visit Diagnosis: Muscle weakness (generalized)  Other lack of coordination  Difficulty in walking, not elsewhere classified  Repeated falls     Problem List Patient Active Problem List   Diagnosis Date Noted  . Sepsis ([place]) 10/18/2018    Biran Mayberry PT, DPT 03/04/2019, 10:51 AM  [place] MAIN [facility] SERVICES [address] [place], [place], [address] Phone: [phone]   Fax:  [phone]  Name: Kenneth Summers MRN: [id number] Date of Birth: [birth year]

## 2019-03-05 ENCOUNTER — Other Ambulatory Visit: Payer: Self-pay

## 2019-03-05 ENCOUNTER — Encounter: Payer: Medicare Other | Admitting: Occupational Therapy

## 2019-03-05 ENCOUNTER — Ambulatory Visit: Payer: Medicare Other | Admitting: Physical Therapy

## 2019-03-05 DIAGNOSIS — R296 Repeated falls: Secondary | ICD-10-CM

## 2019-03-05 DIAGNOSIS — R278 Other lack of coordination: Secondary | ICD-10-CM

## 2019-03-05 DIAGNOSIS — R262 Difficulty in walking, not elsewhere classified: Secondary | ICD-10-CM

## 2019-03-05 DIAGNOSIS — M6281 Muscle weakness (generalized): Secondary | ICD-10-CM

## 2019-03-06 ENCOUNTER — Ambulatory Visit: Payer: Medicare Other | Admitting: Occupational Therapy

## 2019-03-06 ENCOUNTER — Ambulatory Visit: Payer: Medicare Other | Admitting: Physical Therapy

## 2019-03-06 ENCOUNTER — Encounter: Payer: Self-pay | Admitting: Physical Therapy

## 2019-03-06 NOTE — Therapy (Signed)
[place] MAIN [facility] SERVICES [address] [place], [place], [address] Phone: [phone]   Fax:  [phone]  Physical Therapy Treatment  Patient Details  Name: Kenneth Summers MRN: [id number] Date of Birth: [birth year] Referring Provider (PT): Dr. [name], Guy Begin   Encounter Date: 03/05/2019  PT End of Session - 03/06/19 0853    Visit Number  95    Number of Visits  108    Date for PT Re-Evaluation  02/27/19    Authorization Type  Medicare reporting period starting 09/11/18    Authorization Time Period  goals last assessed 02/12/19    PT Start Time  1255    PT Stop Time  1340    PT Time Calculation (min)  45 min    Equipment Utilized During Treatment  Gait belt    Activity Tolerance  Patient tolerated treatment well;No increased pain    Behavior During Therapy  WFL for tasks assessed/performed       Past Medical History:  Diagnosis Date  . Alcohol abuse   . APS (antiphospholipid syndrome) ([place])   . BPH (benign prostatic hyperplasia)   . CAD (coronary artery disease)   . Cellulitis   . Cervical spinal stenosis   . Chronic osteomyelitis ([place])   . CKD (chronic kidney disease)   . CKD (chronic kidney disease)   . Clostridium difficile diarrhea   . Depression   . Diplopia   . Fatigue   . GERD (gastroesophageal reflux disease)   . Hyperlipemia   . Hypertension   . Hypovitaminosis D   . IBS (irritable bowel syndrome)   . 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 Insomnia   . Left lumbosacral radiculopathy   . Moderate obstructive sleep apnea   . Osteomyelitis of foot ([place])   . PAD (peripheral artery disease) ([place])   . Pruritus   . RA (rheumatoid arthritis) ([place])   . Radiculopathy   . Restless legs syndrome   . RLS (restless legs syndrome)   . SLE (systemic lupus erythematosus related syndrome) ([place])   . Stroke ([place])   . Toxic maculopathy of both eyes     Past Surgical History:  Procedure Laterality Date  . CARDIAC  CATHETERIZATION    . CAROTID ENDARTERECTOMY    . CERVICAL LAMINECTOMY    . CORONARY ANGIOPLASTY    . FRACTURE SURGERY    . fusion C5-6-7    . HERNIA REPAIR    . KNEE ARTHROSCOPY    . TONSILLECTOMY      There were no vitals filed for this visit.  Subjective Assessment - 03/06/19 0852    Subjective  Patient reports doing well. Denies any pain. Denies any fatigue.    Patient is accompained by:  Family member    Pertinent History  Patient is a 74 y.o. male who presents to outpatient physical therapy with a referral for medical diagnosis of CVA. This patient's chief complaints consist of left hemiplegia and overall deconditioning leading to the following functional deficits: dependent for ADLs, IADLs, unable to transfer to car, dependent transfers at home with hoyer lift, unable to walk or stand without significant assistance, difficulty with W/C navigation..    Currently in Pain?  No/denies    Multiple Pain Sites  No          TREATMENT:  Patient expressed desire to complete a car transfer in order to be able to get a COVID vaccination.  Patient able to position power chair beside car well  with good positioning;   Patient instructed in a sit<>stand/stand pivot transfer to back drivers side seat; This allowed therapist to block LLE knee for better knee extension in standing for improved safety awareness. He required min A +2 with patient holding onto door to pull self up and 2nd person in back of car, helping slide hips backwards for better transfer ability;   Patient required mod A+1 for lifting LE into car. He was able to push through UE and LE and scoot to side for neutral sitting position in back of car.  Patient fatigued. Educated patient on proper sitting position and safety awareness with sitting in car.  Then instructed patient in sliding feet out of car, mod A +1 and then transferring sit<>stand and stand pivot to wheelchair. He required min A +2 with better positioning and  ease. He did require mod VCs for proper hand placement and positioning.   Patient required mod A +2 for scooting back into chair for proper positioning;  Patient completed 2 car transfers requiring mod VCs for proper positioning and set up and increased time for proper positioning. He was able to progress to min A +2 this session with improved positioning and safety awareness.   Following car transfers he required mod A +2 for sliding back in power chair for optimal sitting position;   Response to treatment: Patient excited that he was able to successfully transfer into car. He did report increased fatigue but denies any pain. Patient required increased cues for proper hand placement and positioning for optimal safety awareness with transfers. He would benefit from additional skilled intervention for better safety awareness with patient and family to improve success and ease with car transfers;                       PT Education - 03/06/19 0853    Education Details  transfers/positioning;    Person(s) Educated  Patient;Child(ren)    Methods  Explanation;Demonstration;Verbal cues    Comprehension  Verbalized understanding;Returned demonstration;Verbal cues required;Need further instruction       PT Short Term Goals - 01/13/19 1721      PT SHORT TERM GOAL #1   Title  Be independent with initial home exercise program for self-management of symptoms.    Baseline  HEP to be given at second session; advice to practice unsupported sitting at home (06/19/2018); 07/08/18: Performing at home, 6/29: needs assistance but is doing exercise program regularly; 08/22/18: adherent;    Time  2    Period  Weeks    Status  Achieved    Target Date  06/26/18        PT Long Term Goals - 02/12/19 1634      PT LONG TERM GOAL #1   Title  Patient will complete all bed mobility with min A to improve functional independence for getting in and out of bed and adjusting in bed.     Baseline  max  A - total A reported by family (06/19/2018); 07/08/18: still requires maxA, dtr reports improvement in rolling since starting therapy; 6/23: min A to supervision in clinic; not doing at home right; 8/5: Pt's daughter states that his rolling has improved, but still has difficulty with all other bed mobility; 10/23/18: pts daughter states that she is doing about 35% of the work if he is in proper position, he does uses the bed rails to help roll;  using Hoyer for coming to sit EOB; 10/12: pts daughter states  that she is doing about 30% of the work if he is in proper position, he does uses the bed rails to help roll, but it is improving;  using Hoyer for coming to sit EOB, 10/29: min A for sit to sidelying, rolling supervision, able to transition sidelying to sit with HHA;    Time  12    Period  Weeks    Status  Achieved      PT LONG TERM GOAL #2   Title  Patient will complete sit <> stand transfer chair to chair with Min A and LRAD to improve functional independence for household and community mobility.     Baseline  variable min-modA for STS, still requires elevated surface height to 27"    Time  12    Period  Weeks    Status  On-going    Target Date  02/27/19      PT LONG TERM GOAL #3   Title  Patient will navigate power w/c with min A x 100 feet to improve mobility for household and short community distances.    Baseline  required total A (06/12/2018); able to roll 20 feet with min A (06/20/2018); 07/08/18: Pt can go approximately 10' without assist per family;  10/23/2018: pt's daughter states that his power WC mobility has become 100% better with the new hand control, slowed speed, and with practice; 10/12: pt's daughter reports that he can perform household WC mobility and limited community ambulation with minA    Time  12    Period  Weeks    Status  Achieved      PT LONG TERM GOAL #4   Title  Patient will complete car transfers W/C to family SUV with min A using LRAD to improve his abilty to  participate in community activities.     Baseline  unable to complete car transfers at all (06/12/2018); unable to complete car transfers at all (06/19/2018).; 07/08/18: Unable to perform at this time; 09/11/18: unable to perform at this time; 10/23/2018: unable to complete car transfers at all; 10/12:  unable to complete car transfers at all, 10/29: unable; 01/13/19: unable;    Time  12    Period  Weeks    Status  On-going    Target Date  02/27/19      PT LONG TERM GOAL #5   Title  Patient will ambulate at least 150 feet with mod I using LRAD to improve mobility for household and community distances.     Time  12    Period  Weeks    Status  Deferred    Target Date  02/27/19      PT LONG TERM GOAL #6   Title  Patient will be able to safety navigate up/down ramp with power chair, exhibiting good safety awareness, mod I to safely enter/exit his home.    Time  12    Period  Weeks    Status  On-going    Target Date  02/27/19      PT LONG TERM GOAL #8   Title  Patient will increase LLE quad strength to 3+/5 to improve functional strength for standing and mobility;    Baseline  10/28: 2+/5; 01/13/19: 2+/5    Time  12    Period  Weeks    Status  On-going    Target Date  02/27/19            Plan - 03/06/19 0853    Clinical Impression Statement  Patient motivated and participated well within session. He was instructed in proper positioning and safety awareness with car transfers. Patient was able to position power chair safely for car transfer. He continues to require min Vcs for proper hand placement and foot positioning. Patient can be impulsive and requires cues for proper positioning prior to transfer. When he is positioned well prior to standing, patient able to complete a stand pivot transfer to car with min A +2. Patient does require increased time for proper positioning and requires mod A for lifting LE into car. Patient requires cues for hand placement but then able to scoot back in seat  for better positioning. Patient able to complete 2 stand pivot transfers to car. Daughter educated in proper positioning for better safety awareness. Patient is fatigued at end of session. He required mod A +2 for positioning in power chair at end of session. He would benefit from additional skilled PT Intervention to improve strength, balance and mobility;    Comorbidities  Relevant past medical history and comorbidities include long term steroid use for lupus, CKD, chronic osteomyelitis, cervical spine stenosis, BPH, APS, alcohol abuse, peripheral artery disease, depression, GERD, obstructive sleep apnea, HTN, IBS, IgA deficiency, lumbar radiculopathy, RA, Stroke, toxic maculopathy in both eyes, systemic lupus erythematosus related syndrome, cardiac catheterization, carotid endarterectomy, cervical laminectomy, coronary angioplasty, Fusion C5-C7, knee arthroplasty, lumbar surgery.    Rehab Potential  Fair    PT Frequency  3x / week    PT Duration  12 weeks    PT Treatment/Interventions  ADLs/Self Care Home Management;Electrical Stimulation;Moist Heat;Cryotherapy;Gait training;Stair training;Functional mobility training;Therapeutic exercise;Balance training;Neuromuscular re-education;Cognitive remediation;Patient/family education;Orthotic Fit/Training;Wheelchair mobility training;Manual techniques;Passive range of motion;Energy conservation;Joint Manipulations    PT Next Visit Plan  Work on strength, sitting/standing balance, functional activities such as rolling, bed mobility, transfers; caregiver training    PT Home Exercise Plan  no updates this session    Consulted and Agree with Plan of Care  Patient;Family member/caregiver    Family Member Consulted  daughter [name]       Patient will benefit from skilled therapeutic intervention in order to improve the following deficits and impairments:  Abnormal gait, Decreased activity tolerance, Decreased cognition, Decreased endurance, Decreased knowledge of  use of DME, Decreased range of motion, Decreased skin integrity, Decreased strength, Impaired perceived functional ability, Impaired sensation, Impaired UE functional use, Improper body mechanics, Pain, Cardiopulmonary status limiting activity, Decreased balance, Decreased coordination, Decreased mobility, Difficulty walking, Impaired tone, Postural dysfunction  Visit Diagnosis: Muscle weakness (generalized)  Other lack of coordination  Difficulty in walking, not elsewhere classified  Repeated falls     Problem List Patient Active Problem List   Diagnosis Date Noted  . Sepsis ([place]) 10/18/2018    Saran Laviolette,MargaretPT, DPT 03/06/2019, 9:13 AM  [place] MAIN [facility] SERVICES [address] [place], [place], [address] Phone: [phone]   Fax:  [phone]  Name: Kenneth Summers MRN: [id number] Date of Birth: 10-03-1945

## 2019-03-10 ENCOUNTER — Encounter: Payer: Self-pay | Admitting: Physical Therapy

## 2019-03-10 ENCOUNTER — Encounter: Payer: Self-pay | Admitting: Occupational Therapy

## 2019-03-10 ENCOUNTER — Other Ambulatory Visit: Payer: Self-pay

## 2019-03-10 ENCOUNTER — Ambulatory Visit: Payer: Medicare Other | Admitting: Occupational Therapy

## 2019-03-10 ENCOUNTER — Ambulatory Visit: Payer: Medicare Other | Attending: Family Medicine | Admitting: Physical Therapy

## 2019-03-10 DIAGNOSIS — R296 Repeated falls: Secondary | ICD-10-CM | POA: Diagnosis present

## 2019-03-10 DIAGNOSIS — R262 Difficulty in walking, not elsewhere classified: Secondary | ICD-10-CM | POA: Insufficient documentation

## 2019-03-10 DIAGNOSIS — M6281 Muscle weakness (generalized): Secondary | ICD-10-CM

## 2019-03-10 DIAGNOSIS — R278 Other lack of coordination: Secondary | ICD-10-CM | POA: Insufficient documentation

## 2019-03-10 NOTE — Addendum Note (Signed)
Addended by: Blanche East E on: 03/10/2019 12:49 PM   Modules accepted: Orders

## 2019-03-10 NOTE — Therapy (Signed)
[place] MAIN [facility] SERVICES [address] [place], [place], [address] Phone: [phone]   Fax:  [phone]  Occupational Therapy Treatment  Patient Details  Name: Kenneth Summers MRN: [id number] Date of Birth: 27-Sep-1945 No data recorded  Encounter Date: 03/10/2019  OT End of Session - 03/10/19 1618    Visit Number  87    Number of Visits  108    Date for OT Re-Evaluation  04/07/19    Authorization Type  Progress report period starting 12/26/2018    OT Start Time  1347    OT Stop Time  1427    OT Time Calculation (min)  40 min    Activity Tolerance  Patient tolerated treatment well;Patient limited by fatigue    Behavior During Therapy  [facility] for tasks assessed/performed       Past Medical History:  Diagnosis Date  . Alcohol abuse   . APS (antiphospholipid syndrome) ([place])   . BPH (benign prostatic hyperplasia)   . CAD (coronary artery disease)   . Cellulitis   . Cervical spinal stenosis   . Chronic osteomyelitis ([place])   . CKD (chronic kidney disease)   . CKD (chronic kidney disease)   . Clostridium difficile diarrhea   . Depression   . Diplopia   . Fatigue   . GERD (gastroesophageal reflux disease)   . Hyperlipemia   . Hypertension   . Hypovitaminosis D   . IBS (irritable bowel syndrome)   . 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 Insomnia   . Left lumbosacral radiculopathy   . Moderate obstructive sleep apnea   . Osteomyelitis of foot ([place])   . PAD (peripheral artery disease) ([place])   . Pruritus   . RA (rheumatoid arthritis) ([place])   . Radiculopathy   . Restless legs syndrome   . RLS (restless legs syndrome)   . SLE (systemic lupus erythematosus related syndrome) ([place])   . Stroke ([place])   . Toxic maculopathy of both eyes     Past Surgical History:  Procedure Laterality Date  . CARDIAC CATHETERIZATION    . CAROTID ENDARTERECTOMY    . CERVICAL LAMINECTOMY    . CORONARY ANGIOPLASTY    . FRACTURE SURGERY    . fusion C5-6-7    .  HERNIA REPAIR    . KNEE ARTHROSCOPY    . TONSILLECTOMY      There were no vitals filed for this visit.  Subjective Assessment - 03/10/19 1617    Subjective   Pt. was very fatigued following PT today    Patient is accompanied by:  Family member    Currently in Pain?  Yes    Pain Score  2     Pain Location  Shoulder    Pain Orientation  Right    Pain Descriptors / Indicators  Aching      OT TREATMENT    Therapeutic Exercise:  Pt. performed 2# dowel ex. For UE strengthening secondary to weakness. Bilateral shoulder flexion, chest press, circular patterns. 2# dumbbell ex. for elbow flexion and extension, forearm supination/pronation, wrist flexion/extension. Pt. requires rest breaks and verbal cues for proper technique.  Selfcare:  Pt. worked on UE dressing tasks using hemi-dressing techniques. Pt. requiredlesscues forinitiatingposition of the sweatshirtwhenpreparing todonn a zip down sweatshirt over the LUE. Pt. Required increasedcues to thread the LUE with the right, assist to bring the jacket around his back, and reposition the jacket to prepare for zipping.Pt. Required increased assist to align the zipper in his  hands. Pt. Worked on multiple reps.   Response to Treatment:  Pt. was fatigued following PT today. Pt. required cues for step by step instruction during each task, with increased cues needed. Pt. required increased assist to donn his jacket, and manage the zipper. Pt. with limited tolerance for UE ther. ex today secondary to right shoulder pain.                      OT Education - 03/10/19 1618    Education Details  LUE functioning    Person(s) Educated  Patient    Methods  Explanation;Demonstration    Comprehension  Returned demonstration;Tactile cues required;Verbalized understanding;Need further instruction          OT Long Term Goals - 01/27/19 1404      OT LONG TERM GOAL #1   Title  Pt. will increase left shoulder flexion AROM by  10 degrees to access cabinet/shelf.    Baseline  Pt. is improving with LUE ROM, and functional reaching.    Time  12    Period  Weeks    Status  On-going    Target Date  04/07/19      OT LONG TERM GOAL #2   Title  Pt. will donn a shirt with Supervision.    Baseline  MinA donning a zip down jacket    Time  12    Period  Weeks    Status  On-going    Target Date  04/07/19      OT LONG TERM GOAL #4   Title  Pt. will improve left grip strength by 10# to be able to open a jar/container.    Time  12    Period  Weeks    Status  On-going    Target Date  04/07/19      OT LONG TERM GOAL #5   Title  Pt. will improve left hand [facility] skills to be able to assist with buttoning/zipping.    Baseline  Pt. has made excellent progress with bilateral [facility] skills. Pt. continues to work on improving bilateral [employer] skills for buttoning/zipping    Time  12    Period  Weeks    Status  On-going    Target Date  04/07/19      OT LONG TERM GOAL #6   Title  Pt. will demonstrate visual conmpensatory strategies during 100% of the time during ADLs    Baseline  Pt. requires cues at times for left sides awareness.    Time  12    Period  Weeks    Status  On-going    Target Date  04/07/19      OT LONG TERM GOAL #8   Title  Pt. will accurately identify potential safety hazard using good safety awareness, and judgement 100% for ADLs, and IADLs.    Baseline  Pt. continues to be limited    Time  12    Period  Weeks    Status  On-going    Target Date  04/07/19      OT LONG TERM GOAL  #9   TITLE  Pt. will  navigate his w/c around obstacles with Supervision and 100% accuracy    Baseline  Pt. continues to require consistent cuing for obstacle on the left    Time  12    Period  Weeks    Status  On-going    Target Date  04/07/19      OT LONG TERM  GOAL  #11   TITLE  Pt. will increase BUE strength by 73mm grades in preparation for ADL transfers.    Baseline  Limited BUE strength    Time  12    Period  Weeks     Status  On-going    Target Date  04/07/19            Plan - 03/10/19 1618    Clinical Impression Statement  Pt. was fatigued following PT today. Pt. required cues for step by step instruction during each task, with increased cues needed. Pt. required increased assist to donn his jacket, and manage the zipper. Pt. with limited tolerance for UE ther. ex today secondary to right shoulder pain.    OT Occupational Profile and History  Problem Focused Assessment - Including review of records relating to presenting problem    Occupational performance deficits (Please refer to evaluation for details):  ADL's    Body Structure / Function / Physical Skills  UE functional use;Coordination;FMC;Dexterity;Strength;ROM    Cognitive Skills  Attention;Memory;Emotional;Problem Solve;Safety Awareness    Rehab Potential  Good    Comorbidities Affecting Occupational Performance:  Presence of comorbidities impacting occupational performance    Comorbidities impacting occupational performance description:  Phyical, cognitive, visual,  medical comorbidities    Modification or Assistance to Complete Evaluation   No modification of tasks or assist necessary to complete eval    OT Frequency  2x / week    OT Duration  12 weeks    OT Treatment/Interventions  Self-care/ADL training;DME and/or AE instruction;Therapeutic exercise;Therapeutic activities;Moist Heat;Cognitive remediation/compensation;Neuromuscular education;Visual/perceptual remediation/compensation;Coping strategies training;Patient/family education;Passive range of motion;Psychosocial skills training;Energy conservation;Functional Mobility Training    Family Member Consulted  Daughter [name]       Patient will benefit from skilled therapeutic intervention in order to improve the following deficits and impairments:   Body Structure / Function / Physical Skills: UE functional use, Coordination, FMC, Dexterity, Strength, ROM Cognitive Skills: Attention,  Memory, Emotional, Problem Solve, Safety Awareness     Visit Diagnosis: Muscle weakness (generalized)    Problem List Patient Active Problem List   Diagnosis Date Noted  . Sepsis ([place]) 10/18/2018    Harrel Carina, MS, OTR/L 03/10/2019, 4:30 PM  [place] MAIN [facility] SERVICES [address] [place], [place], [address] Phone: [phone]   Fax:  [phone]  Name: Kenneth Summers MRN: [id number] Date of Birth: [birth year]

## 2019-03-10 NOTE — Therapy (Signed)
[name] [facility] SERVICES [address] [place], [place], [address] Phone: [phone]   Fax:  [phone]  Physical Therapy Treatment  Patient Details  Name: Kenneth Summers MRN: [id number] Date of Birth: 1945-06-18 Referring Provider (PT): Dr. [name], Guy Begin   Encounter Date: 03/10/2019  PT End of Session - 03/10/19 1250    Visit Number  [id number]    Number of Visits  144    Date for PT Re-Evaluation  05/23/19    Authorization Type  Medicare reporting period starting 09/11/18    Authorization Time Period  goals last assessed 02/12/19    PT Start Time  1300    PT Stop Time  1345    PT Time Calculation (min)  45 min    Equipment Utilized During Treatment  Gait belt    Activity Tolerance  Patient tolerated treatment well;No increased pain    Behavior During Therapy  WFL for tasks assessed/performed       Past Medical History:  Diagnosis Date  . Alcohol abuse   . APS (antiphospholipid syndrome) ([place])   . BPH (benign prostatic hyperplasia)   . CAD (coronary artery disease)   . Cellulitis   . Cervical spinal stenosis   . Chronic osteomyelitis ([place])   . CKD (chronic kidney disease)   . CKD (chronic kidney disease)   . Clostridium difficile diarrhea   . Depression   . Diplopia   . Fatigue   . GERD (gastroesophageal reflux disease)   . Hyperlipemia   . Hypertension   . Hypovitaminosis D   . IBS (irritable bowel syndrome)   . 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 Insomnia   . Left lumbosacral radiculopathy   . Moderate obstructive sleep apnea   . Osteomyelitis of foot ([place])   . PAD (peripheral artery disease) ([place])   . Pruritus   . RA (rheumatoid arthritis) ([place])   . Radiculopathy   . Restless legs syndrome   . RLS (restless legs syndrome)   . SLE (systemic lupus erythematosus related syndrome) ([place])   . Stroke ([place])   . Toxic maculopathy of both eyes     Past Surgical History:  Procedure Laterality Date  . CARDIAC CATHETERIZATION     . CAROTID ENDARTERECTOMY    . CERVICAL LAMINECTOMY    . CORONARY ANGIOPLASTY    . FRACTURE SURGERY    . fusion C5-6-7    . HERNIA REPAIR    . KNEE ARTHROSCOPY    . TONSILLECTOMY      There were no vitals filed for this visit.  Subjective Assessment - 03/10/19 1326    Subjective  Patient reports doing well; Had a restful weekend; no pain;    Patient is accompained by:  Family member    Pertinent History  Patient is a 74 y.o. male who presents to outpatient physical therapy with a referral for medical diagnosis of CVA. This patient's chief complaints consist of left hemiplegia and overall deconditioning leading to the following functional deficits: dependent for ADLs, IADLs, unable to transfer to car, dependent transfers at home with hoyer lift, unable to walk or stand without significant assistance, difficulty with W/C navigation..    Currently in Pain?  No/denies            TREATMENT:   Transferred sit<>Stand in parallel bars (seat adjusted to 27 inch from floor height) x10 reps throughout session; Initially he required CGA for forward weight shift and cues for proper hand placement. However was  able to progress to supervision with sit<>stand transfers after 3-4th repetition.    Patient was able to stand for 75 sec for at least 1 repetition with min VCs for weight shift and increase hip/trunk extension for better weight bearing in BLE, required supervision for safety;    Progressed to sit<>Stand transfer with standing in parallel bars:             Mini squat 2x10 reps with min A for proper weight shift as patient exhibits heavy posterior lean; Patient required tactile and verbal cues for weight shift to neutral position and then initiated mini squat. With initial standing, patient often shifted to RLE;   Standing in parallel bars: Side/side weight shift with min A for blocking LLE knee 2x5 reps with B rail assist and cues for neutral positioning;   Seated: LAQ AROM x10  reps bilaterally with visual cues to increase ROM for better strengthening; Required min VCs to isolate knee extension on LLE and reduce hip flexion;  Yellow tband around BLE: Hip flexion march x10 with good AROM noted in BLE. Attempted hip abduction/ER but patient unable to isolate LLE due to weakness;    In Standing: Hip flexion march  able to complete 10 marches RLE with min A and LLE knee block into extension for stability; x10 reps on LLE partial ROM and cues for erect posture and weight shift to RLE;      Patient required min A to scoot back in chair at end of session due to increased fatigue   Response to treatment: Tolerated well. With all transfers patient required mod VCs for forward weight shift and to increase hip/knee extension in stance. Patient was able to progress transfers from [facility] to supervision with better LE Muscle activation to stand.     Patient heavily fatigued at end of session.                     PT Education - 03/10/19 1250    Education Details  transfers/strengthening, HEP    Person(s) Educated  Patient;Child(ren)    Methods  Explanation;Demonstration;Verbal cues    Comprehension  Verbalized understanding;Returned demonstration;Verbal cues required;Need further instruction       PT Short Term Goals - 01/13/19 1721      PT SHORT TERM GOAL #1   Title  Be independent with initial home exercise program for self-management of symptoms.    Baseline  HEP to be given at second session; advice to practice unsupported sitting at home (06/19/2018); 07/08/18: Performing at home, 6/29: needs assistance but is doing exercise program regularly; 08/22/18: adherent;    Time  2    Period  Weeks    Status  Achieved    Target Date  06/26/18        PT Long Term Goals - 02/12/19 1634      PT LONG TERM GOAL #1   Title  Patient will complete all bed mobility with min A to improve functional independence for getting in and out of bed and adjusting in bed.      Baseline  max A - total A reported by family (06/19/2018); 07/08/18: still requires maxA, dtr reports improvement in rolling since starting therapy; 6/23: min A to supervision in clinic; not doing at home right; 8/5: Pt's daughter states that his rolling has improved, but still has difficulty with all other bed mobility; 10/23/18: pts daughter states that she is doing about 35% of the work if he is in proper  position, he does uses the bed rails to help roll;  using Harrel Lemon for coming to sit EOB; 10/12: pts daughter states that she is doing about 30% of the work if he is in proper position, he does uses the bed rails to help roll, but it is improving;  using Hoyer for coming to sit EOB, 10/29: min A for sit to sidelying, rolling supervision, able to transition sidelying to sit with HHA;    Time  12    Period  Weeks    Status  Achieved      PT LONG TERM GOAL #2   Title  Patient will complete sit <> stand transfer chair to chair with Min A and LRAD to improve functional independence for household and community mobility.     Baseline  variable min-modA for STS, still requires elevated surface height to 27"    Time  12    Period  Weeks    Status  On-going    Target Date  02/27/19      PT LONG TERM GOAL #3   Title  Patient will navigate power w/c with min A x 100 feet to improve mobility for household and short community distances.    Baseline  required total A (06/12/2018); able to roll 20 feet with min A (06/20/2018); 07/08/18: Pt can go approximately 10' without assist per family;  10/23/2018: pt's daughter states that his power WC mobility has become 100% better with the new hand control, slowed speed, and with practice; 10/12: pt's daughter reports that he can perform household WC mobility and limited community ambulation with minA    Time  12    Period  Weeks    Status  Achieved      PT LONG TERM GOAL #4   Title  Patient will complete car transfers W/C to family SUV with min A using LRAD to improve his  abilty to participate in community activities.     Baseline  unable to complete car transfers at all (06/12/2018); unable to complete car transfers at all (06/19/2018).; 07/08/18: Unable to perform at this time; 09/11/18: unable to perform at this time; 10/23/2018: unable to complete car transfers at all; 10/12:  unable to complete car transfers at all, 10/29: unable; 01/13/19: unable;    Time  12    Period  Weeks    Status  On-going    Target Date  02/27/19      PT LONG TERM GOAL #5   Title  Patient will ambulate at least 150 feet with mod I using LRAD to improve mobility for household and community distances.     Time  12    Period  Weeks    Status  Deferred    Target Date  02/27/19      PT LONG TERM GOAL #6   Title  Patient will be able to safety navigate up/down ramp with power chair, exhibiting good safety awareness, mod I to safely enter/exit his home.    Time  12    Period  Weeks    Status  On-going    Target Date  02/27/19      PT LONG TERM GOAL #8   Title  Patient will increase LLE quad strength to 3+/5 to improve functional strength for standing and mobility;    Baseline  10/28: 2+/5; 01/13/19: 2+/5    Time  12    Period  Weeks    Status  On-going    Target Date  02/27/19  Plan - 03/10/19 1349    Clinical Impression Statement  Patient motivated and participated well within session. He continutes to progress with LE strengthening exhibiting improved AROM. He does require min VCs and visual cues for correct positioning and muscle activation. Patient does fatigue with prolonged standing and with repetitive standing. He was able to progress standing exercise with increased repetition. Patient would benefit from additional skilled PT intervention to improve strength, balance and mobility;    Comorbidities  Relevant past medical history and comorbidities include long term steroid use for lupus, CKD, chronic osteomyelitis, cervical spine stenosis, BPH, APS, alcohol abuse,  peripheral artery disease, depression, GERD, obstructive sleep apnea, HTN, IBS, IgA deficiency, lumbar radiculopathy, RA, Stroke, toxic maculopathy in both eyes, systemic lupus erythematosus related syndrome, cardiac catheterization, carotid endarterectomy, cervical laminectomy, coronary angioplasty, Fusion C5-C7, knee arthroplasty, lumbar surgery.    Rehab Potential  Fair    PT Frequency  3x / week    PT Duration  12 weeks    PT Treatment/Interventions  ADLs/Self Care Home Management;Electrical Stimulation;Moist Heat;Cryotherapy;Gait training;Stair training;Functional mobility training;Therapeutic exercise;Balance training;Neuromuscular re-education;Cognitive remediation;Patient/family education;Orthotic Fit/Training;Wheelchair mobility training;Manual techniques;Passive range of motion;Energy conservation;Joint Manipulations    PT Next Visit Plan  Work on strength, sitting/standing balance, functional activities such as rolling, bed mobility, transfers; caregiver training    PT Home Exercise Plan  no updates this session    Consulted and Agree with Plan of Care  Patient;Family member/caregiver    Family Member Consulted  daughter [name]       Patient will benefit from skilled therapeutic intervention in order to improve the following deficits and impairments:  Abnormal gait, Decreased activity tolerance, Decreased cognition, Decreased endurance, Decreased knowledge of use of DME, Decreased range of motion, Decreased skin integrity, Decreased strength, Impaired perceived functional ability, Impaired sensation, Impaired UE functional use, Improper body mechanics, Pain, Cardiopulmonary status limiting activity, Decreased balance, Decreased coordination, Decreased mobility, Difficulty walking, Impaired tone, Postural dysfunction  Visit Diagnosis: Muscle weakness (generalized)  Other lack of coordination  Difficulty in walking, not elsewhere classified  Repeated falls     Problem List Patient  Active Problem List   Diagnosis Date Noted  . Sepsis ([place]) 10/18/2018    Kele Barthelemy PT, DPT 03/10/2019, 1:51 PM  [place] MAIN [facility] SERVICES [address] [place], [place], [address] Phone: [phone]   Fax:  [phone]  Name: Kenneth Summers MRN: [id number] Date of Birth: [birth year]

## 2019-03-12 ENCOUNTER — Other Ambulatory Visit: Payer: Self-pay

## 2019-03-12 ENCOUNTER — Encounter: Payer: Self-pay | Admitting: Physical Therapy

## 2019-03-12 ENCOUNTER — Ambulatory Visit: Payer: Medicare Other | Admitting: Physical Therapy

## 2019-03-12 DIAGNOSIS — M6281 Muscle weakness (generalized): Secondary | ICD-10-CM

## 2019-03-12 DIAGNOSIS — R296 Repeated falls: Secondary | ICD-10-CM

## 2019-03-12 DIAGNOSIS — R262 Difficulty in walking, not elsewhere classified: Secondary | ICD-10-CM

## 2019-03-12 DIAGNOSIS — R278 Other lack of coordination: Secondary | ICD-10-CM

## 2019-03-12 NOTE — Therapy (Signed)
[place] MAIN [facility] SERVICES [address] [place], [place], [address] Phone: [phone]   Fax:  [phone]  Physical Therapy Treatment  Patient Details  Name: Kenneth Summers MRN: [id number] Date of Birth: 1945/07/27 Referring Provider (PT): Dr. [name], Guy Begin   Encounter Date: 03/12/2019  PT End of Session - 03/12/19 1313    Visit Number  7    Number of Visits  144    Date for PT Re-Evaluation  05/23/19    Authorization Type  Medicare reporting period starting 09/11/18    Authorization Time Period  goals last assessed 02/12/19    PT Start Time  1302    PT Stop Time  1345    PT Time Calculation (min)  43 min    Equipment Utilized During Treatment  Gait belt    Activity Tolerance  Patient tolerated treatment well;No increased pain    Behavior During Therapy  WFL for tasks assessed/performed       Past Medical History:  Diagnosis Date  . Alcohol abuse   . APS (antiphospholipid syndrome) ([place])   . BPH (benign prostatic hyperplasia)   . CAD (coronary artery disease)   . Cellulitis   . Cervical spinal stenosis   . Chronic osteomyelitis ([place])   . CKD (chronic kidney disease)   . CKD (chronic kidney disease)   . Clostridium difficile diarrhea   . Depression   . Diplopia   . Fatigue   . GERD (gastroesophageal reflux disease)   . Hyperlipemia   . Hypertension   . Hypovitaminosis D   . IBS (irritable bowel syndrome)   . 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 Insomnia   . Left lumbosacral radiculopathy   . Moderate obstructive sleep apnea   . Osteomyelitis of foot ([place])   . PAD (peripheral artery disease) ([place])   . Pruritus   . RA (rheumatoid arthritis) ([place])   . Radiculopathy   . Restless legs syndrome   . RLS (restless legs syndrome)   . SLE (systemic lupus erythematosus related syndrome) ([place])   . Stroke ([place])   . Toxic maculopathy of both eyes     Past Surgical History:  Procedure Laterality Date  . CARDIAC CATHETERIZATION     . CAROTID ENDARTERECTOMY    . CERVICAL LAMINECTOMY    . CORONARY ANGIOPLASTY    . FRACTURE SURGERY    . fusion C5-6-7    . HERNIA REPAIR    . KNEE ARTHROSCOPY    . TONSILLECTOMY      There were no vitals filed for this visit.  Subjective Assessment - 03/12/19 1312    Subjective  Patient reports doing well; he denies any pain; Reports no fatigue.    Patient is accompained by:  Family member    Pertinent History  Patient is a 74 y.o. male who presents to outpatient physical therapy with a referral for medical diagnosis of CVA. This patient's chief complaints consist of left hemiplegia and overall deconditioning leading to the following functional deficits: dependent for ADLs, IADLs, unable to transfer to car, dependent transfers at home with hoyer lift, unable to walk or stand without significant assistance, difficulty with W/C navigation..    Currently in Pain?  No/denies          TREATMENT:  Patient transferred stand pivot wheelchair<>nustep with BUE and therapist assistance, min A +1;  Patient does require increased time for proper positioning and set up; He requires cues for walking LLE foot around and required  min A for placing LLE foot onto foot plate;    Instructed patient in Nustep, seat #15, arms #14 Pt instructed in BUE/BLE level 2x5 min with cues to increase speed to around 50-55 steps per minute to challenge cardiovascular endurance with cues to improve positional awareness for neutral position.   Distance propelled: 0.15 miles, then continued for 1 additional minute, LE only level 0 at 50 steps per minute;  HR 95 bpm at end of session;   Patient sitting on Nustep seat: Instructed patient in LE strengthening exercise: 2# ankle weights around BLE: -hip flexion march x10 reps bilaterally; -LAQ x10 reps bilaterally  Partial ROM on LLE due to weakness with mod VCs for correct exercise technique and positioning for increased muscle activation;   Following therapeutic  exercise:  Transferred nustep to wheelchair squat pivot with mod A +2 for safety; Patient required cues for hand placement and positioning including to increase forward weight shift. He had increased difficulty pivoting to wheelchair likely due to fatigue;  Transferred sit<>Stand in parallel bars (seat adjusted to 27 inch from floor height) x3 reps throughout session;He was able to transfer with supervision with mod VCs for forward weight shift for better trunk control and transfer ability;   Standing in parallel bars: Side/side weight shift with min A for blocking LLE knee 2x5 reps with B rail assist and cues for neutral positioning; Patient initially exhibits flexed posture with decreased hip extension; He was able to exhibit improved hip extension during 2nd set following instruction;    Patient required min A to scoot back in chair at end of session due to increased fatigue   Response to treatment: Tolerated well. With all transfers patient required mod VCs for forward weight shift and to increase hip/knee extension in stance. He was able to tolerate increased resistance with exercise this session with less difficulty. He continues to have decreased LLE terminal knee extension in stance which limits stance control.   Patient heavily fatigued at end of session.                       PT Education - 03/12/19 1313    Education Details  transfers/strengthening, HEP    Person(s) Educated  Patient;Child(ren)    Methods  Explanation;Verbal cues    Comprehension  Verbalized understanding;Returned demonstration;Verbal cues required;Need further instruction       PT Short Term Goals - 01/13/19 1721      PT SHORT TERM GOAL #1   Title  Be independent with initial home exercise program for self-management of symptoms.    Baseline  HEP to be given at second session; advice to practice unsupported sitting at home (06/19/2018); 07/08/18: Performing at home, 6/29: needs assistance but is  doing exercise program regularly; 08/22/18: adherent;    Time  2    Period  Weeks    Status  Achieved    Target Date  06/26/18        PT Long Term Goals - 02/12/19 1634      PT LONG TERM GOAL #1   Title  Patient will complete all bed mobility with min A to improve functional independence for getting in and out of bed and adjusting in bed.     Baseline  max A - total A reported by family (06/19/2018); 07/08/18: still requires maxA, dtr reports improvement in rolling since starting therapy; 6/23: min A to supervision in clinic; not doing at home right; 8/5: Pt's daughter states that his  rolling has improved, but still has difficulty with all other bed mobility; 10/23/18: pts daughter states that she is doing about 35% of the work if he is in proper position, he does uses the bed rails to help roll;  using Harrel Lemon for coming to sit EOB; 10/12: pts daughter states that she is doing about 30% of the work if he is in proper position, he does uses the bed rails to help roll, but it is improving;  using Hoyer for coming to sit EOB, 10/29: min A for sit to sidelying, rolling supervision, able to transition sidelying to sit with HHA;    Time  12    Period  Weeks    Status  Achieved      PT LONG TERM GOAL #2   Title  Patient will complete sit <> stand transfer chair to chair with Min A and LRAD to improve functional independence for household and community mobility.     Baseline  variable min-modA for STS, still requires elevated surface height to 27"    Time  12    Period  Weeks    Status  On-going    Target Date  02/27/19      PT LONG TERM GOAL #3   Title  Patient will navigate power w/c with min A x 100 feet to improve mobility for household and short community distances.    Baseline  required total A (06/12/2018); able to roll 20 feet with min A (06/20/2018); 07/08/18: Pt can go approximately 10' without assist per family;  10/23/2018: pt's daughter states that his power WC mobility has become 100% better  with the new hand control, slowed speed, and with practice; 10/12: pt's daughter reports that he can perform household WC mobility and limited community ambulation with minA    Time  12    Period  Weeks    Status  Achieved      PT LONG TERM GOAL #4   Title  Patient will complete car transfers W/C to family SUV with min A using LRAD to improve his abilty to participate in community activities.     Baseline  unable to complete car transfers at all (06/12/2018); unable to complete car transfers at all (06/19/2018).; 07/08/18: Unable to perform at this time; 09/11/18: unable to perform at this time; 10/23/2018: unable to complete car transfers at all; 10/12:  unable to complete car transfers at all, 10/29: unable; 01/13/19: unable;    Time  12    Period  Weeks    Status  On-going    Target Date  02/27/19      PT LONG TERM GOAL #5   Title  Patient will ambulate at least 150 feet with mod I using LRAD to improve mobility for household and community distances.     Time  12    Period  Weeks    Status  Deferred    Target Date  02/27/19      PT LONG TERM GOAL #6   Title  Patient will be able to safety navigate up/down ramp with power chair, exhibiting good safety awareness, mod I to safely enter/exit his home.    Time  12    Period  Weeks    Status  On-going    Target Date  02/27/19      PT LONG TERM GOAL #8   Title  Patient will increase LLE quad strength to 3+/5 to improve functional strength for standing and mobility;    Baseline  10/28: 2+/5; 01/13/19: 2+/5    Time  12    Period  Weeks    Status  On-going    Target Date  02/27/19            Plan - 03/12/19 1315    Clinical Impression Statement  Patient motivated and participated well within session. Progressed LE strengthening, instructing patient in Nustep activity. Patient was able to exhibit improved UE/LE movement with less stopping and less distractions. He does require cues for proper positioning and trunk control, exhibiting slight  increase in left lateral trunk lean this session. Patient is progressing well with transfers. He was instructed in standing weight shifts to facilitate better standing balance and control. Patient heavily fatigued at end of session but denies any pain. He would benefit from additional skilled PT intervention to improve strength, balance and mobility;    Comorbidities  Relevant past medical history and comorbidities include long term steroid use for lupus, CKD, chronic osteomyelitis, cervical spine stenosis, BPH, APS, alcohol abuse, peripheral artery disease, depression, GERD, obstructive sleep apnea, HTN, IBS, IgA deficiency, lumbar radiculopathy, RA, Stroke, toxic maculopathy in both eyes, systemic lupus erythematosus related syndrome, cardiac catheterization, carotid endarterectomy, cervical laminectomy, coronary angioplasty, Fusion C5-C7, knee arthroplasty, lumbar surgery.    Rehab Potential  Fair    PT Frequency  3x / week    PT Duration  12 weeks    PT Treatment/Interventions  ADLs/Self Care Home Management;Electrical Stimulation;Moist Heat;Cryotherapy;Gait training;Stair training;Functional mobility training;Therapeutic exercise;Balance training;Neuromuscular re-education;Cognitive remediation;Patient/family education;Orthotic Fit/Training;Wheelchair mobility training;Manual techniques;Passive range of motion;Energy conservation;Joint Manipulations    PT Next Visit Plan  Work on strength, sitting/standing balance, functional activities such as rolling, bed mobility, transfers; caregiver training    PT Home Exercise Plan  no updates this session    Consulted and Agree with Plan of Care  Patient;Family member/caregiver    Family Member Consulted  daughter [name]       Patient will benefit from skilled therapeutic intervention in order to improve the following deficits and impairments:  Abnormal gait, Decreased activity tolerance, Decreased cognition, Decreased endurance, Decreased knowledge of use of  DME, Decreased range of motion, Decreased skin integrity, Decreased strength, Impaired perceived functional ability, Impaired sensation, Impaired UE functional use, Improper body mechanics, Pain, Cardiopulmonary status limiting activity, Decreased balance, Decreased coordination, Decreased mobility, Difficulty walking, Impaired tone, Postural dysfunction  Visit Diagnosis: Muscle weakness (generalized)  Other lack of coordination  Difficulty in walking, not elsewhere classified  Repeated falls     Problem List Patient Active Problem List   Diagnosis Date Noted  . Sepsis ([place]) 10/18/2018    Tayten Bergdoll  PT,DPT 03/12/2019, 1:49 PM  [place] MAIN [facility] SERVICES [address] [place], [place], [address] Phone: [phone]   Fax:  [phone]  Name: Kenneth Summers MRN: [id number] Date of Birth: 12/15/1945

## 2019-03-13 ENCOUNTER — Other Ambulatory Visit: Payer: Self-pay

## 2019-03-13 ENCOUNTER — Ambulatory Visit: Payer: Medicare Other | Admitting: Physical Therapy

## 2019-03-13 ENCOUNTER — Ambulatory Visit: Payer: Medicare Other | Admitting: Occupational Therapy

## 2019-03-13 ENCOUNTER — Encounter: Payer: Self-pay | Admitting: Occupational Therapy

## 2019-03-13 ENCOUNTER — Encounter: Payer: Self-pay | Admitting: Physical Therapy

## 2019-03-13 DIAGNOSIS — R262 Difficulty in walking, not elsewhere classified: Secondary | ICD-10-CM

## 2019-03-13 DIAGNOSIS — M6281 Muscle weakness (generalized): Secondary | ICD-10-CM | POA: Diagnosis not present

## 2019-03-13 DIAGNOSIS — R278 Other lack of coordination: Secondary | ICD-10-CM

## 2019-03-13 DIAGNOSIS — R296 Repeated falls: Secondary | ICD-10-CM

## 2019-03-13 NOTE — Therapy (Signed)
[place] MAIN [facility] SERVICES [address] [place], [place], [address] Phone: [phone]   Fax:  [phone]  Physical Therapy Treatment  Patient Details  Name: Kenneth Summers MRN: [id number] Date of Birth: 1945/11/24 Referring Provider (PT): Dr. [name], Guy Begin   Encounter Date: 03/13/2019  PT End of Session - 03/13/19 1354    Visit Number  18    Number of Visits  144    Date for PT Re-Evaluation  05/23/19    Authorization Type  Medicare reporting period starting 09/11/18    Authorization Time Period  goals last assessed 02/12/19    PT Start Time  1345    PT Stop Time  1430    PT Time Calculation (min)  45 min    Equipment Utilized During Treatment  Gait belt    Activity Tolerance  Patient tolerated treatment well;No increased pain    Behavior During Therapy  WFL for tasks assessed/performed       Past Medical History:  Diagnosis Date  . Alcohol abuse   . APS (antiphospholipid syndrome) ([place])   . BPH (benign prostatic hyperplasia)   . CAD (coronary artery disease)   . Cellulitis   . Cervical spinal stenosis   . Chronic osteomyelitis ([place])   . CKD (chronic kidney disease)   . CKD (chronic kidney disease)   . Clostridium difficile diarrhea   . Depression   . Diplopia   . Fatigue   . GERD (gastroesophageal reflux disease)   . Hyperlipemia   . Hypertension   . Hypovitaminosis D   . IBS (irritable bowel syndrome)   . 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 Insomnia   . Left lumbosacral radiculopathy   . Moderate obstructive sleep apnea   . Osteomyelitis of foot ([place])   . PAD (peripheral artery disease) ([place])   . Pruritus   . RA (rheumatoid arthritis) ([place])   . Radiculopathy   . Restless legs syndrome   . RLS (restless legs syndrome)   . SLE (systemic lupus erythematosus related syndrome) ([place])   . Stroke ([place])   . Toxic maculopathy of both eyes     Past Surgical History:  Procedure Laterality Date  . CARDIAC CATHETERIZATION     . CAROTID ENDARTERECTOMY    . CERVICAL LAMINECTOMY    . CORONARY ANGIOPLASTY    . FRACTURE SURGERY    . fusion C5-6-7    . HERNIA REPAIR    . KNEE ARTHROSCOPY    . TONSILLECTOMY      There were no vitals filed for this visit.  Subjective Assessment - 03/13/19 1353    Subjective  Patient reports doing well; slight soreness in LLE due to sitting in chair for prolonged period of time;    Patient is accompained by:  Family member    Pertinent History  Patient is a 74 y.o. male who presents to outpatient physical therapy with a referral for medical diagnosis of CVA. This patient's chief complaints consist of left hemiplegia and overall deconditioning leading to the following functional deficits: dependent for ADLs, IADLs, unable to transfer to car, dependent transfers at home with hoyer lift, unable to walk or stand without significant assistance, difficulty with W/C navigation..    Currently in Pain?  No/denies    Multiple Pain Sites  No         TREATMENT: Patient performed squat pivot transfer wheelchair to mat table with min A +1 with heavy cues for forward weight shift and hand  placement  Transitioned sit to sidelying with min A with cues for foot placement and UE positioning; He was able to use RLE to lift LLE onto mat table.   Rolled to supine, supervision;   Following therapeutic activity: Hooklying: PT performed passive hamstring stretch SLR 30 sec hold x2 reps bilaterally; SAQ 2# x10 reps 3 sec hold each LE with cues to increase ROM for better strengthening;  SLR hip flexion 2# x10 each LE with AAROM on LLE due to weakness;  Bridges with arms by side x15 reps with cues for increased push through BLE for better neutral hip positioning;  Following exercise, patient rolled to right side with min a and min VCs for hand placement and LE positioning;  Patient transitioned sidelying to sitting with min A for pulling self up into sitting. He was able to slide legs off mat  table without assistance;  Sitting on edge of mat able, transferred squat pivot to wheelchair min A +1 with cues for hand placement and positioning; He also required mod A for scooting into power chair for proper positioning;  Tolerated session well. He reports less discomfort following exercise and reports feeling better after moving legs. He reports minimal fatigue; Denies any pain;                        PT Education - 03/13/19 1353    Education Details  transfers/strengthening, HEP    Person(s) Educated  Patient    Methods  Explanation    Comprehension  Verbalized understanding       PT Short Term Goals - 01/13/19 1721      PT SHORT TERM GOAL #1   Title  Be independent with initial home exercise program for self-management of symptoms.    Baseline  HEP to be given at second session; advice to practice unsupported sitting at home (06/19/2018); 07/08/18: Performing at home, 6/29: needs assistance but is doing exercise program regularly; 08/22/18: adherent;    Time  2    Period  Weeks    Status  Achieved    Target Date  06/26/18        PT Long Term Goals - 02/12/19 1634      PT LONG TERM GOAL #1   Title  Patient will complete all bed mobility with min A to improve functional independence for getting in and out of bed and adjusting in bed.     Baseline  max A - total A reported by family (06/19/2018); 07/08/18: still requires maxA, dtr reports improvement in rolling since starting therapy; 6/23: min A to supervision in clinic; not doing at home right; 8/5: Pt's daughter states that his rolling has improved, but still has difficulty with all other bed mobility; 10/23/18: pts daughter states that she is doing about 35% of the work if he is in proper position, he does uses the bed rails to help roll;  using Hoyer for coming to sit EOB; 10/12: pts daughter states that she is doing about 30% of the work if he is in proper position, he does uses the bed rails to help roll, but  it is improving;  using Hoyer for coming to sit EOB, 10/29: min A for sit to sidelying, rolling supervision, able to transition sidelying to sit with HHA;    Time  12    Period  Weeks    Status  Achieved      PT LONG TERM GOAL #2   Title  Patient will complete sit <> stand transfer chair to chair with Min A and LRAD to improve functional independence for household and community mobility.     Baseline  variable min-modA for STS, still requires elevated surface height to 27"    Time  12    Period  Weeks    Status  On-going    Target Date  02/27/19      PT LONG TERM GOAL #3   Title  Patient will navigate power w/c with min A x 100 feet to improve mobility for household and short community distances.    Baseline  required total A (06/12/2018); able to roll 20 feet with min A (06/20/2018); 07/08/18: Pt can go approximately 10' without assist per family;  10/23/2018: pt's daughter states that his power WC mobility has become 100% better with the new hand control, slowed speed, and with practice; 10/12: pt's daughter reports that he can perform household WC mobility and limited community ambulation with minA    Time  12    Period  Weeks    Status  Achieved      PT LONG TERM GOAL #4   Title  Patient will complete car transfers W/C to family SUV with min A using LRAD to improve his abilty to participate in community activities.     Baseline  unable to complete car transfers at all (06/12/2018); unable to complete car transfers at all (06/19/2018).; 07/08/18: Unable to perform at this time; 09/11/18: unable to perform at this time; 10/23/2018: unable to complete car transfers at all; 10/12:  unable to complete car transfers at all, 10/29: unable; 01/13/19: unable;    Time  12    Period  Weeks    Status  On-going    Target Date  02/27/19      PT LONG TERM GOAL #5   Title  Patient will ambulate at least 150 feet with mod I using LRAD to improve mobility for household and community distances.     Time  12     Period  Weeks    Status  Deferred    Target Date  02/27/19      PT LONG TERM GOAL #6   Title  Patient will be able to safety navigate up/down ramp with power chair, exhibiting good safety awareness, mod I to safely enter/exit his home.    Time  12    Period  Weeks    Status  On-going    Target Date  02/27/19      PT LONG TERM GOAL #8   Title  Patient will increase LLE quad strength to 3+/5 to improve functional strength for standing and mobility;    Baseline  10/28: 2+/5; 01/13/19: 2+/5    Time  12    Period  Weeks    Status  On-going    Target Date  02/27/19            Plan - 03/13/19 1532    Clinical Impression Statement  Patient motivated and participated well within session. instructed patient in advanced LE strengthening on mat table. He requires min A +1 for squat pivot transfers requiring increased time and cues for proper positioning and technique. He is progressing well with bed mobility being able to roll with better UE/LE positioning. patient did fatigue with advanced exercise. He would benefit from additional skilled PT Intervention to improve strength, balance and mobility;    Comorbidities  Relevant past medical history and comorbidities include long term steroid  use for lupus, CKD, chronic osteomyelitis, cervical spine stenosis, BPH, APS, alcohol abuse, peripheral artery disease, depression, GERD, obstructive sleep apnea, HTN, IBS, IgA deficiency, lumbar radiculopathy, RA, Stroke, toxic maculopathy in both eyes, systemic lupus erythematosus related syndrome, cardiac catheterization, carotid endarterectomy, cervical laminectomy, coronary angioplasty, Fusion C5-C7, knee arthroplasty, lumbar surgery.    Rehab Potential  Fair    PT Frequency  3x / week    PT Duration  12 weeks    PT Treatment/Interventions  ADLs/Self Care Home Management;Electrical Stimulation;Moist Heat;Cryotherapy;Gait training;Stair training;Functional mobility training;Therapeutic exercise;Balance  training;Neuromuscular re-education;Cognitive remediation;Patient/family education;Orthotic Fit/Training;Wheelchair mobility training;Manual techniques;Passive range of motion;Energy conservation;Joint Manipulations    PT Next Visit Plan  Work on strength, sitting/standing balance, functional activities such as rolling, bed mobility, transfers; caregiver training    PT Home Exercise Plan  no updates this session    Consulted and Agree with Plan of Care  Patient;Family member/caregiver    Family Member Consulted  daughter [name]       Patient will benefit from skilled therapeutic intervention in order to improve the following deficits and impairments:  Abnormal gait, Decreased activity tolerance, Decreased cognition, Decreased endurance, Decreased knowledge of use of DME, Decreased range of motion, Decreased skin integrity, Decreased strength, Impaired perceived functional ability, Impaired sensation, Impaired UE functional use, Improper body mechanics, Pain, Cardiopulmonary status limiting activity, Decreased balance, Decreased coordination, Decreased mobility, Difficulty walking, Impaired tone, Postural dysfunction  Visit Diagnosis: Muscle weakness (generalized)  Other lack of coordination  Difficulty in walking, not elsewhere classified  Repeated falls     Problem List Patient Active Problem List   Diagnosis Date Noted  . Sepsis ([place]) 10/18/2018    Rhena Glace PT, DPT 03/13/2019, 3:33 PM  [place] MAIN [facility] SERVICES [address] [place], [place], [address] Phone: [phone]   Fax:  [phone]  Name: Kenneth Summers MRN: [id number] Date of Birth: 1945-05-12

## 2019-03-13 NOTE — Therapy (Signed)
[place] MAIN [facility] SERVICES [address] [place], [place], [address] Phone: [phone]   Fax:  [phone]  Occupational Therapy Treatment  Patient Details  Name: Kenneth Summers MRN: [id number] Date of Birth: November 30, 1945 No data recorded  Encounter Date: 03/13/2019  OT End of Session - 03/13/19 1458    Visit Number  81    Number of Visits  108    Date for OT Re-Evaluation  04/07/19    Authorization Type  Progress report period starting 12/26/2018    OT Start Time  1255    OT Stop Time  1340    OT Time Calculation (min)  45 min    Activity Tolerance  Patient tolerated treatment well;Patient limited by fatigue    Behavior During Therapy  [facility] for tasks assessed/performed       Past Medical History:  Diagnosis Date  . Alcohol abuse   . APS (antiphospholipid syndrome) ([place])   . BPH (benign prostatic hyperplasia)   . CAD (coronary artery disease)   . Cellulitis   . Cervical spinal stenosis   . Chronic osteomyelitis ([place])   . CKD (chronic kidney disease)   . CKD (chronic kidney disease)   . Clostridium difficile diarrhea   . Depression   . Diplopia   . Fatigue   . GERD (gastroesophageal reflux disease)   . Hyperlipemia   . Hypertension   . Hypovitaminosis D   . IBS (irritable bowel syndrome)   . 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 Insomnia   . Left lumbosacral radiculopathy   . Moderate obstructive sleep apnea   . Osteomyelitis of foot ([place])   . PAD (peripheral artery disease) ([place])   . Pruritus   . RA (rheumatoid arthritis) ([place])   . Radiculopathy   . Restless legs syndrome   . RLS (restless legs syndrome)   . SLE (systemic lupus erythematosus related syndrome) ([place])   . Stroke ([place])   . Toxic maculopathy of both eyes     Past Surgical History:  Procedure Laterality Date  . CARDIAC CATHETERIZATION    . CAROTID ENDARTERECTOMY    . CERVICAL LAMINECTOMY    . CORONARY ANGIOPLASTY    . FRACTURE SURGERY    . fusion C5-6-7    .  HERNIA REPAIR    . KNEE ARTHROSCOPY    . TONSILLECTOMY      There were no vitals filed for this visit.  Subjective Assessment - 03/13/19 1457    Subjective   Pt. was more aleart today.    Patient is accompanied by:  Family member    Pertinent History  Pt. is a 74 y.o. 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 Pt. PMHx includes: Multiple Falls, Lupus, DJD, Renal Abscess, CVA. Pt. resides with his wife. Pt.'s wife and daughter assist with ADLs. Pt. has caregivers in  for 2 hours a day, 6 days a week. Pt. received Rehab services in acute care, at SNF for STR, and [place] services. Pt. is retired from [employer] for [employer] and [employer].    Patient Stated Goals  To regain use of his left UE, and do more for himself.    Currently in Pain?  No/denies      OT TREATMENT    Neuro muscular re-education:  Pt. worked on grasping 1", 3/4", and 1/2" washers with his left hand from a magnetic dish, and worked on placing the on a small horizontal dowel positioned  to the left.  Therapeutic Exercise:  Pt. performed 2# dowel ex. For UE strengthening secondary to weakness. Bilateral shoulder flexion, chest press, circular patterns using a mirror for proper form.Pt. performed resistive EZ Board exercises for forearm supination/pronation, wrist flexion/extension using gross grasp, and lateral pinch (key) grasp. Pt. performed resistive EZ Board exercises angled in several planes to promote shoulder flexion, abduction, and wrist flexion, and extension while performing resistive wrist flexion and extension with a gross grip.  Response to Treatment  Pt. was more alert, less fatiques, and more actively engaging in the session today. Pt. reports that he does much better with OT when he has it first. Pt. continues to present with limited BUE strength, and [facility] skills in order to increase LUE engagement during daily care tasks, and maximize independence with ADLs, and IADL  tasks.                        OT Education - 03/13/19 1458    Education Details  LUE functioning    Person(s) Educated  Patient    Methods  Explanation;Demonstration    Comprehension  Returned demonstration;Tactile cues required;Verbalized understanding;Need further instruction          OT Long Term Goals - 01/27/19 1404      OT LONG TERM GOAL #1   Title  Pt. will increase left shoulder flexion AROM by 10 degrees to access cabinet/shelf.    Baseline  Pt. is improving with LUE ROM, and functional reaching.    Time  12    Period  Weeks    Status  On-going    Target Date  04/07/19      OT LONG TERM GOAL #2   Title  Pt. will donn a shirt with Supervision.    Baseline  MinA donning a zip down jacket    Time  12    Period  Weeks    Status  On-going    Target Date  04/07/19      OT LONG TERM GOAL #4   Title  Pt. will improve left grip strength by 10# to be able to open a jar/container.    Time  12    Period  Weeks    Status  On-going    Target Date  04/07/19      OT LONG TERM GOAL #5   Title  Pt. will improve left hand [facility] skills to be able to assist with buttoning/zipping.    Baseline  Pt. has made excellent progress with bilateral [facility] skills. Pt. continues to work on improving bilateral [facility] skills for buttoning/zipping    Time  12    Period  Weeks    Status  On-going    Target Date  04/07/19      OT LONG TERM GOAL #6   Title  Pt. will demonstrate visual conmpensatory strategies during 100% of the time during ADLs    Baseline  Pt. requires cues at times for left sides awareness.    Time  12    Period  Weeks    Status  On-going    Target Date  04/07/19      OT LONG TERM GOAL #8   Title  Pt. will accurately identify potential safety hazard using good safety awareness, and judgement 100% for ADLs, and IADLs.    Baseline  Pt. continues to be limited    Time  12    Period  Weeks    Status  On-going    Target Date  04/07/19      OT LONG TERM GOAL   #9   TITLE  Pt. will  navigate his w/c around obstacles with Supervision and 100% accuracy    Baseline  Pt. continues to require consistent cuing for obstacle on the left    Time  12    Period  Weeks    Status  On-going    Target Date  04/07/19      OT LONG TERM GOAL  #11   TITLE  Pt. will increase BUE strength by 67mm grades in preparation for ADL transfers.    Baseline  Limited BUE strength    Time  12    Period  Weeks    Status  On-going    Target Date  04/07/19            Plan - 03/13/19 1459    Clinical Impression Statement Pt. was more alert, less fatiques, and more actively engaging in the session today. Pt. reports that he does much better with OT when he has it first. Pt. continues to present with limited BUE strength, and [facility] skills in order to increase LUE engagement during daily care tasks, and maximize independence with ADLs, and IADL tasks.    OT Occupational Profile and History  Problem Focused Assessment - Including review of records relating to presenting problem    Occupational performance deficits (Please refer to evaluation for details):  ADL's    Body Structure / Function / Physical Skills  UE functional use;Coordination;FMC;Dexterity;Strength;ROM    Cognitive Skills  Attention;Memory;Emotional;Problem Solve;Safety Awareness    Rehab Potential  Good    Clinical Decision Making  Several treatment options, min-mod task modification necessary    Comorbidities Affecting Occupational Performance:  Presence of comorbidities impacting occupational performance    Comorbidities impacting occupational performance description:  Phyical, cognitive, visual,  medical comorbidities    Modification or Assistance to Complete Evaluation   No modification of tasks or assist necessary to complete eval    OT Frequency  2x / week    OT Duration  12 weeks    OT Treatment/Interventions  Self-care/ADL training;DME and/or AE instruction;Therapeutic exercise;Therapeutic activities;Moist  Heat;Cognitive remediation/compensation;Neuromuscular education;Visual/perceptual remediation/compensation;Coping strategies training;Patient/family education;Passive range of motion;Psychosocial skills training;Energy conservation;Functional Mobility Training    Consulted and Agree with Plan of Care  Patient    Family Member Consulted  Daughter [name]       Patient will benefit from skilled therapeutic intervention in order to improve the following deficits and impairments:   Body Structure / Function / Physical Skills: UE functional use, Coordination, FMC, Dexterity, Strength, ROM Cognitive Skills: Attention, Memory, Emotional, Problem Solve, Safety Awareness     Visit Diagnosis: Muscle weakness (generalized)  Other lack of coordination    Problem List Patient Active Problem List   Diagnosis Date Noted  . Sepsis ([place]) 10/18/2018    Harrel Carina, MS, OTR/L 03/13/2019, 3:04 PM  [place] MAIN [facility] SERVICES [address] [place], [place], [address] Phone: [phone]   Fax:  [phone]  Name: Kenneth Summers MRN: [id number] Date of Birth: Dec 04, 1945

## 2019-03-17 ENCOUNTER — Other Ambulatory Visit: Payer: Self-pay

## 2019-03-17 ENCOUNTER — Ambulatory Visit: Payer: Medicare Other | Admitting: Physical Therapy

## 2019-03-17 ENCOUNTER — Encounter: Payer: Self-pay | Admitting: Physical Therapy

## 2019-03-17 ENCOUNTER — Encounter: Payer: Self-pay | Admitting: Occupational Therapy

## 2019-03-17 ENCOUNTER — Ambulatory Visit: Payer: Medicare Other | Admitting: Occupational Therapy

## 2019-03-17 DIAGNOSIS — R278 Other lack of coordination: Secondary | ICD-10-CM

## 2019-03-17 DIAGNOSIS — R296 Repeated falls: Secondary | ICD-10-CM

## 2019-03-17 DIAGNOSIS — M6281 Muscle weakness (generalized): Secondary | ICD-10-CM

## 2019-03-17 DIAGNOSIS — R262 Difficulty in walking, not elsewhere classified: Secondary | ICD-10-CM

## 2019-03-17 NOTE — Therapy (Signed)
[place] MAIN [facility] SERVICES [address] [place], [place], [address] Phone: [phone]   Fax:  [phone]  Occupational Therapy Treatment  Patient Details  Name: Kenneth Summers MRN: [id number] Date of Birth: [birth year] No data recorded  Encounter Date: 03/17/2019  OT End of Session - 03/17/19 1503    Visit Number  53    Number of Visits  108    Date for OT Re-Evaluation  04/07/19    OT Start Time  [id number]    OT Stop Time  1428    OT Time Calculation (min)  40 min    Activity Tolerance  Patient tolerated treatment well;Patient limited by fatigue    Behavior During Therapy  [facility] for tasks assessed/performed       Past Medical History:  Diagnosis Date  . Alcohol abuse   . APS (antiphospholipid syndrome) ([place])   . BPH (benign prostatic hyperplasia)   . CAD (coronary artery disease)   . Cellulitis   . Cervical spinal stenosis   . Chronic osteomyelitis ([place])   . CKD (chronic kidney disease)   . CKD (chronic kidney disease)   . Clostridium difficile diarrhea   . Depression   . Diplopia   . Fatigue   . GERD (gastroesophageal reflux disease)   . Hyperlipemia   . Hypertension   . Hypovitaminosis D   . IBS (irritable bowel syndrome)   . 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 Insomnia   . Left lumbosacral radiculopathy   . Moderate obstructive sleep apnea   . Osteomyelitis of foot ([place])   . PAD (peripheral artery disease) ([place])   . Pruritus   . RA (rheumatoid arthritis) ([place])   . Radiculopathy   . Restless legs syndrome   . RLS (restless legs syndrome)   . SLE (systemic lupus erythematosus related syndrome) ([place])   . Stroke ([place])   . Toxic maculopathy of both eyes     Past Surgical History:  Procedure Laterality Date  . CARDIAC CATHETERIZATION    . CAROTID ENDARTERECTOMY    . CERVICAL LAMINECTOMY    . CORONARY ANGIOPLASTY    . FRACTURE SURGERY    . fusion C5-6-7    . HERNIA REPAIR    . KNEE ARTHROSCOPY    . TONSILLECTOMY       There were no vitals filed for this visit.  Subjective Assessment - 03/17/19 1502    Subjective   Pt. report that he does better with OT when it's scheduled before PT.    Patient is accompanied by:  Family member    Pertinent History  Pt. is a 74 y.o. 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 Pt. PMHx includes: Multiple Falls, Lupus, DJD, Renal Abscess, CVA. Pt. resides with his wife. Pt.'s wife and daughter assist with ADLs. Pt. has caregivers in  for 2 hours a day, 6 days a week. Pt. received Rehab services in acute care, at SNF for STR, and [place] services. Pt. is retired from [employer] for [employer] and [employer].    Patient Stated Goals  To regain use of his left UE, and do more for himself.    Currently in Pain?  No/denies       OT TREATMENT    Neuro muscular re-education:  Pt. worked on grasping 1", 3/4", and 1/2" washers with his left hand from a magnetic dish, and worked on placing the on a vertical, diagonal, and horizontall dowel positioned  to the left.  Therapeutic Exercise:  Pt. performed 2.5# dowel ex. For UE strengthening secondary to weakness. Bilateral shoulder flexion, chest press, circular patterns using a mirror for proper form.   Self-care:   Pt. worked on UE dressing tasks using hemi-dressing techniques. Pt. requiredlesscues forinitiatingpositionofthe sweatshirtwhenpreparing todonn a zip down sweatshirtover the LUE.Pt. Required increasedcues to thread the LUE with the right, assist to bring the jacket around his back, and reposition the jacket to prepare for zipping.Pt. Was able to independently complete the zipper.   Response to Treatment:  Pt. was fatigued after PT, and required rest breaks today. Pt.  continues to work on improving LUE strength, motor control, and [facility] skills. Pt. was able to tolerate increased weight for the dowel during strengthening. Pt. continues to require verbal cues for  form/technique during the exercises, and cues to engage his left hand during [facility] tasks. Pt. continues to work on improving UE strength, and [facility] skills to improve overall engagement of the LUE, and hand during ADLs, and IADL tasks.                 OT Education - 03/17/19 1503    Education Details  LUE functioning    Person(s) Educated  Patient    Methods  Explanation;Demonstration    Comprehension  Returned demonstration;Tactile cues required;Verbalized understanding;Need further instruction          OT Long Term Goals - 01/27/19 1404      OT LONG TERM GOAL #1   Title  Pt. will increase left shoulder flexion AROM by 10 degrees to access cabinet/shelf.    Baseline  Pt. is improving with LUE ROM, and functional reaching.    Time  12    Period  Weeks    Status  On-going    Target Date  04/07/19      OT LONG TERM GOAL #2   Title  Pt. will donn a shirt with Supervision.    Baseline  MinA donning a zip down jacket    Time  12    Period  Weeks    Status  On-going    Target Date  04/07/19      OT LONG TERM GOAL #4   Title  Pt. will improve left grip strength by 10# to be able to open a jar/container.    Time  12    Period  Weeks    Status  On-going    Target Date  04/07/19      OT LONG TERM GOAL #5   Title  Pt. will improve left hand [facility] skills to be able to assist with buttoning/zipping.    Baseline  Pt. has made excellent progress with bilateral [facility] skills. Pt. continues to work on improving bilateral [facility] skills for buttoning/zipping    Time  12    Period  Weeks    Status  On-going    Target Date  04/07/19      OT LONG TERM GOAL #6   Title  Pt. will demonstrate visual conmpensatory strategies during 100% of the time during ADLs    Baseline  Pt. requires cues at times for left sides awareness.    Time  12    Period  Weeks    Status  On-going    Target Date  04/07/19      OT LONG TERM GOAL #8   Title  Pt. will accurately identify potential safety hazard  using good safety awareness, and judgement 100% for ADLs, and  IADLs.    Baseline  Pt. continues to be limited    Time  12    Period  Weeks    Status  On-going    Target Date  04/07/19      OT LONG TERM GOAL  #9   TITLE  Pt. will  navigate his w/c around obstacles with Supervision and 100% accuracy    Baseline  Pt. continues to require consistent cuing for obstacle on the left    Time  12    Period  Weeks    Status  On-going    Target Date  04/07/19      OT LONG TERM GOAL  #11   TITLE  Pt. will increase BUE strength by 75mm grades in preparation for ADL transfers.    Baseline  Limited BUE strength    Time  12    Period  Weeks    Status  On-going    Target Date  04/07/19            Plan - 03/17/19 1503    Clinical Impression Statement  Pt. was fatigued after PT, and required rest breaks today. Pt.  continues to work on improving LUE strength, motor control, and [facility] skills. Pt. was able to tolerate increased weight for the dowel during strengthening. Pt. continues to require verbal cues for form/technique during the exercises, and cues to engage his left hand during [facility] tasks. Pt. continues to work on improving UE strength, and [facility] skills to improve overall engagement of the LUE, and hand during ADLs, and IADL tasks.    OT Occupational Profile and History  Problem Focused Assessment - Including review of records relating to presenting problem    Occupational performance deficits (Please refer to evaluation for details):  ADL's    Body Structure / Function / Physical Skills  UE functional use;Coordination;FMC;Dexterity;Strength;ROM    Cognitive Skills  Attention;Memory;Emotional;Problem Solve;Safety Awareness    Rehab Potential  Good    Clinical Decision Making  Several treatment options, min-mod task modification necessary    Comorbidities Affecting Occupational Performance:  Presence of comorbidities impacting occupational performance    Comorbidities impacting occupational  performance description:  Phyical, cognitive, visual,  medical comorbidities    Modification or Assistance to Complete Evaluation   No modification of tasks or assist necessary to complete eval    OT Frequency  2x / week    OT Duration  12 weeks    OT Treatment/Interventions  Self-care/ADL training;DME and/or AE instruction;Therapeutic exercise;Therapeutic activities;Moist Heat;Cognitive remediation/compensation;Neuromuscular education;Visual/perceptual remediation/compensation;Coping strategies training;Patient/family education;Passive range of motion;Psychosocial skills training;Energy conservation;Functional Mobility Training    Consulted and Agree with Plan of Care  Patient    Family Member Consulted  Daughter [name]       Patient will benefit from skilled therapeutic intervention in order to improve the following deficits and impairments:   Body Structure / Function / Physical Skills: UE functional use, Coordination, FMC, Dexterity, Strength, ROM Cognitive Skills: Attention, Memory, Emotional, Problem Solve, Safety Awareness     Visit Diagnosis: Muscle weakness (generalized)    Problem List Patient Active Problem List   Diagnosis Date Noted  . Sepsis ([place]) 10/18/2018    Harrel Carina, MS, OTR/L 03/17/2019, 3:11 PM  [place] MAIN [facility] SERVICES [address] [place], [place], [address] Phone: [phone]   Fax:  [phone]  Name: BATES DENNINGTON MRN: [id number] Date of Birth: 1945/02/20

## 2019-03-17 NOTE — Therapy (Signed)
[place] MAIN [facility] SERVICES [address] [place], [place], [address] Phone: [phone]   Fax:  [phone]  Physical Therapy Treatment  Patient Details  Name: Kenneth Summers MRN: [id number] Date of Birth: [birth year] Referring Provider (PT): Dr. [name], Guy Begin   Encounter Date: 03/17/2019  PT End of Session - 03/17/19 1253    Visit Number  [id number]    Number of Visits  144    Date for PT Re-Evaluation  05/23/19    Authorization Type  Medicare reporting period starting 09/11/18    Authorization Time Period  goals last assessed 02/12/19    PT Start Time  1302    PT Stop Time  1345    PT Time Calculation (min)  43 min    Equipment Utilized During Treatment  Gait belt    Activity Tolerance  Patient tolerated treatment well;No increased pain    Behavior During Therapy  WFL for tasks assessed/performed       Past Medical History:  Diagnosis Date  . Alcohol abuse   . APS (antiphospholipid syndrome) ([place])   . BPH (benign prostatic hyperplasia)   . CAD (coronary artery disease)   . Cellulitis   . Cervical spinal stenosis   . Chronic osteomyelitis ([place])   . CKD (chronic kidney disease)   . CKD (chronic kidney disease)   . Clostridium difficile diarrhea   . Depression   . Diplopia   . Fatigue   . GERD (gastroesophageal reflux disease)   . Hyperlipemia   . Hypertension   . Hypovitaminosis D   . IBS (irritable bowel syndrome)   . 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 Insomnia   . Left lumbosacral radiculopathy   . Moderate obstructive sleep apnea   . Osteomyelitis of foot ([place])   . PAD (peripheral artery disease) ([place])   . Pruritus   . RA (rheumatoid arthritis) ([place])   . Radiculopathy   . Restless legs syndrome   . RLS (restless legs syndrome)   . SLE (systemic lupus erythematosus related syndrome) ([place])   . Stroke ([place])   . Toxic maculopathy of both eyes     Past Surgical History:  Procedure Laterality Date  . CARDIAC CATHETERIZATION     . CAROTID ENDARTERECTOMY    . CERVICAL LAMINECTOMY    . CORONARY ANGIOPLASTY    . FRACTURE SURGERY    . fusion C5-6-7    . HERNIA REPAIR    . KNEE ARTHROSCOPY    . TONSILLECTOMY      There were no vitals filed for this visit.  Subjective Assessment - 03/17/19 1314    Subjective  Patient reports doing well; Reports increased fatigue after staying up late last night to watch the superbowl.    Patient is accompained by:  Family member    Pertinent History  Patient is a 74 y.o. male who presents to outpatient physical therapy with a referral for medical diagnosis of CVA. This patient's chief complaints consist of left hemiplegia and overall deconditioning leading to the following functional deficits: dependent for ADLs, IADLs, unable to transfer to car, dependent transfers at home with hoyer lift, unable to walk or stand without significant assistance, difficulty with W/C navigation..    Currently in Pain?  No/denies    Multiple Pain Sites  No              TREATMENT:  Patient sitting in wheelchair: Instructed patient in LE strengthening exercise: 2# ankle weights around BLE: -hip flexion  march 2 x10 reps bilaterally; -LAQ 2x10 reps bilaterally  Partial ROM on LLE due to weakness with mod VCs for correct exercise technique and positioning for increased muscle activation;  Transferred sit<>Stand in parallel bars (seat adjusted to 27 inch from floor height) x8reps throughout session;He was able to transfer CGA-supervision with mod VCs for forward weight shift for better trunk control and transfer ability;  Standing in parallel bars: Side/side weight shift with min A for blocking LLE knee x10 reps with B rail assist and cues for neutral positioning;Patient initially exhibits flexed posture with decreased hip extension; He was able to exhibit improved hip extension during 2nd set following instruction;   Instructed patient in forward/backward weight shift with therapist  blocking LLE knee, B rail assist 2x10 reps with good heel raise on RLE but minimal heel raise on LLE;  Mini squats x10 reps with therapist intermittent blocking LLE knee, required tactile cues for neutral weight shift for increased weight bearing in BLE;  Patient required minA to scoot back in chair at end of session due to increased fatigue  Response to treatment: [place].With all transfers patient required mod VCs for forward weight shift and to increase hip/knee extension in stance. He was able to tolerate increased resistance with exercise this session with less difficulty. He continues to have decreased LLE terminal knee extension in stance which limits stance control.  Patient heavily fatigued at end of session.                    PT Education - 03/17/19 1252    Education Details  transfers/strengthening, HEP    Person(s) Educated  Patient    Methods  Explanation;Verbal cues    Comprehension  Verbalized understanding;Returned demonstration;Verbal cues required;Need further instruction       PT Short Term Goals - 01/13/19 1721      PT SHORT TERM GOAL #1   Title  Be independent with initial home exercise program for self-management of symptoms.    Baseline  HEP to be given at second session; advice to practice unsupported sitting at home (06/19/2018); 07/08/18: Performing at home, 6/29: needs assistance but is doing exercise program regularly; 08/22/18: adherent;    Time  2    Period  Weeks    Status  Achieved    Target Date  06/26/18        PT Long Term Goals - 02/12/19 1634      PT LONG TERM GOAL #1   Title  Patient will complete all bed mobility with min A to improve functional independence for getting in and out of bed and adjusting in bed.     Baseline  max A - total A reported by family (06/19/2018); 07/08/18: still requires maxA, dtr reports improvement in rolling since starting therapy; 6/23: min A to supervision in clinic; not doing at home  right; 8/5: Pt's daughter states that his rolling has improved, but still has difficulty with all other bed mobility; 10/23/18: pts daughter states that she is doing about 35% of the work if he is in proper position, he does uses the bed rails to help roll;  using Harrel Lemon for coming to sit EOB; 10/12: pts daughter states that she is doing about 30% of the work if he is in proper position, he does uses the bed rails to help roll, but it is improving;  using Hoyer for coming to sit EOB, 10/29: min A for sit to sidelying, rolling supervision, able to transition sidelying to  sit with HHA;    Time  12    Period  Weeks    Status  Achieved      PT LONG TERM GOAL #2   Title  Patient will complete sit <> stand transfer chair to chair with Min A and LRAD to improve functional independence for household and community mobility.     Baseline  variable min-modA for STS, still requires elevated surface height to 27"    Time  12    Period  Weeks    Status  On-going    Target Date  02/27/19      PT LONG TERM GOAL #3   Title  Patient will navigate power w/c with min A x 100 feet to improve mobility for household and short community distances.    Baseline  required total A (06/12/2018); able to roll 20 feet with min A (06/20/2018); 07/08/18: Pt can go approximately 10' without assist per family;  10/23/2018: pt's daughter states that his power WC mobility has become 100% better with the new hand control, slowed speed, and with practice; 10/12: pt's daughter reports that he can perform household WC mobility and limited community ambulation with minA    Time  12    Period  Weeks    Status  Achieved      PT LONG TERM GOAL #4   Title  Patient will complete car transfers W/C to family SUV with min A using LRAD to improve his abilty to participate in community activities.     Baseline  unable to complete car transfers at all (06/12/2018); unable to complete car transfers at all (06/19/2018).; 07/08/18: Unable to perform at this  time; 09/11/18: unable to perform at this time; 10/23/2018: unable to complete car transfers at all; 10/12:  unable to complete car transfers at all, 10/29: unable; 01/13/19: unable;    Time  12    Period  Weeks    Status  On-going    Target Date  02/27/19      PT LONG TERM GOAL #5   Title  Patient will ambulate at least 150 feet with mod I using LRAD to improve mobility for household and community distances.     Time  12    Period  Weeks    Status  Deferred    Target Date  02/27/19      PT LONG TERM GOAL #6   Title  Patient will be able to safety navigate up/down ramp with power chair, exhibiting good safety awareness, mod I to safely enter/exit his home.    Time  12    Period  Weeks    Status  On-going    Target Date  02/27/19      PT LONG TERM GOAL #8   Title  Patient will increase LLE quad strength to 3+/5 to improve functional strength for standing and mobility;    Baseline  10/28: 2+/5; 01/13/19: 2+/5    Time  12    Period  Weeks    Status  On-going    Target Date  02/27/19            Plan - 03/18/19 0900    Clinical Impression Statement  Patient motivated and participated well within session. He continues to progress in LE strength with improved ROM on LLE. Patient does require min VCS for correct exercise technique. Patient instructed in advanced standing exercise to challenge weight shift and standing posture. Patient does require min-mod VCs for correct posture and  weight shift and min A for safety. He does fatigue quickly with prolonged standing, requiring frequent seated rest breaks. Patient would benefit from additional skilled PT intervention to improve strength, balance and gait safety;    Comorbidities  Relevant past medical history and comorbidities include long term steroid use for lupus, CKD, chronic osteomyelitis, cervical spine stenosis, BPH, APS, alcohol abuse, peripheral artery disease, depression, GERD, obstructive sleep apnea, HTN, IBS, IgA deficiency, lumbar  radiculopathy, RA, Stroke, toxic maculopathy in both eyes, systemic lupus erythematosus related syndrome, cardiac catheterization, carotid endarterectomy, cervical laminectomy, coronary angioplasty, Fusion C5-C7, knee arthroplasty, lumbar surgery.    Rehab Potential  Fair    PT Frequency  3x / week    PT Duration  12 weeks    PT Treatment/Interventions  ADLs/Self Care Home Management;Electrical Stimulation;Moist Heat;Cryotherapy;Gait training;Stair training;Functional mobility training;Therapeutic exercise;Balance training;Neuromuscular re-education;Cognitive remediation;Patient/family education;Orthotic Fit/Training;Wheelchair mobility training;Manual techniques;Passive range of motion;Energy conservation;Joint Manipulations    PT Next Visit Plan  Work on strength, sitting/standing balance, functional activities such as rolling, bed mobility, transfers; caregiver training    PT Home Exercise Plan  no updates this session    Consulted and Agree with Plan of Care  Patient;Family member/caregiver    Family Member Consulted  daughter [name]       Patient will benefit from skilled therapeutic intervention in order to improve the following deficits and impairments:  Abnormal gait, Decreased activity tolerance, Decreased cognition, Decreased endurance, Decreased knowledge of use of DME, Decreased range of motion, Decreased skin integrity, Decreased strength, Impaired perceived functional ability, Impaired sensation, Impaired UE functional use, Improper body mechanics, Pain, Cardiopulmonary status limiting activity, Decreased balance, Decreased coordination, Decreased mobility, Difficulty walking, Impaired tone, Postural dysfunction  Visit Diagnosis: Muscle weakness (generalized)  Other lack of coordination  Difficulty in walking, not elsewhere classified  Repeated falls     Problem List Patient Active Problem List   Diagnosis Date Noted  . Sepsis ([place]) 10/18/2018    Zyad Boomer PT,  DPT 03/18/2019, 9:08 AM  [place] MAIN [facility] SERVICES [address] [place], [place], [address] Phone: [phone]   Fax:  [phone]  Name: LIRIDON BOHLEN MRN: [id number] Date of Birth: 1945-09-19

## 2019-03-19 ENCOUNTER — Ambulatory Visit: Payer: Medicare Other | Admitting: Physical Therapy

## 2019-03-19 ENCOUNTER — Other Ambulatory Visit: Payer: Self-pay

## 2019-03-19 ENCOUNTER — Encounter: Payer: Self-pay | Admitting: Physical Therapy

## 2019-03-19 DIAGNOSIS — M6281 Muscle weakness (generalized): Secondary | ICD-10-CM | POA: Diagnosis not present

## 2019-03-19 DIAGNOSIS — R278 Other lack of coordination: Secondary | ICD-10-CM

## 2019-03-19 DIAGNOSIS — R296 Repeated falls: Secondary | ICD-10-CM

## 2019-03-19 DIAGNOSIS — R262 Difficulty in walking, not elsewhere classified: Secondary | ICD-10-CM

## 2019-03-19 NOTE — Therapy (Signed)
[place] MAIN [facility] SERVICES [address] [place], [place], [address] Phone: [phone]   Fax:  [phone]  Physical Therapy Treatment Physical Therapy Progress Note   Dates of reporting period  02/12/19   to   03/19/19   Patient Details  Name: Kenneth Summers MRN: [id number] Date of Birth: February 22, 1945 Referring Provider (PT): Dr. [name], Guy Begin   Encounter Date: 03/19/2019  PT End of Session - 03/19/19 1258    Visit Number  [id number]    Number of Visits  144    Date for PT Re-Evaluation  05/23/19    Authorization Type  Medicare reporting period starting 09/11/18    Authorization Time Period  goals last assessed 02/12/19    PT Start Time  1302    PT Stop Time  1345    PT Time Calculation (min)  43 min    Equipment Utilized During Treatment  Gait belt    Activity Tolerance  Patient tolerated treatment well;No increased pain    Behavior During Therapy  WFL for tasks assessed/performed       Past Medical History:  Diagnosis Date  . Alcohol abuse   . APS (antiphospholipid syndrome) ([place])   . BPH (benign prostatic hyperplasia)   . CAD (coronary artery disease)   . Cellulitis   . Cervical spinal stenosis   . Chronic osteomyelitis ([place])   . CKD (chronic kidney disease)   . CKD (chronic kidney disease)   . Clostridium difficile diarrhea   . Depression   . Diplopia   . Fatigue   . GERD (gastroesophageal reflux disease)   . Hyperlipemia   . Hypertension   . Hypovitaminosis D   . IBS (irritable bowel syndrome)   . 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 Insomnia   . Left lumbosacral radiculopathy   . Moderate obstructive sleep apnea   . Osteomyelitis of foot ([place])   . PAD (peripheral artery disease) ([place])   . Pruritus   . RA (rheumatoid arthritis) ([place])   . Radiculopathy   . Restless legs syndrome   . RLS (restless legs syndrome)   . SLE (systemic lupus erythematosus related syndrome) ([place])   . Stroke ([place])   . Toxic maculopathy of both  eyes     Past Surgical History:  Procedure Laterality Date  . CARDIAC CATHETERIZATION    . CAROTID ENDARTERECTOMY    . CERVICAL LAMINECTOMY    . CORONARY ANGIOPLASTY    . FRACTURE SURGERY    . fusion C5-6-7    . HERNIA REPAIR    . KNEE ARTHROSCOPY    . TONSILLECTOMY      There were no vitals filed for this visit.  Subjective Assessment - 03/20/19 0844    Subjective  Patient reports doing well. He denies any pain and reports minimal fatigue.    Patient is accompained by:  Family member    Pertinent History  Patient is a 74 y.o. male who presents to outpatient physical therapy with a referral for medical diagnosis of CVA. This patient's chief complaints consist of left hemiplegia and overall deconditioning leading to the following functional deficits: dependent for ADLs, IADLs, unable to transfer to car, dependent transfers at home with hoyer lift, unable to walk or stand without significant assistance, difficulty with W/C navigation..    Currently in Pain?  No/denies    Multiple Pain Sites  No          TREATMENT:  Instructed patient in car transers: Patient able  to position power chair beside car well with good positioning;   Patient instructed in a sit<>stand/stand pivot transfer to back drivers side seat; This allowed therapist to block LLE knee for better knee extension in standing for improved safety awareness. He required min A +2 with patient holding onto door to pull self up and 2nd person in back of car, helping slide hips backwards for better transfer ability;   Patient required mod A+1 for lifting LE into car. He was able to push through UE and LE and scoot to side for neutral sitting position in back of car.  Patient fatigued. Educated patient on proper sitting position and safety awareness with sitting in car.  Then instructed patient in sliding feet out of car, mod A +1 and then transferring sit<>stand and stand pivot to wheelchair. He required min A +1 for  sit<>stand transfers with better positioning and ease, but then required mod A +2 for pivot to wheelchair. He did require mod VCs for proper hand placement and positioning.   Patient required mod A +2 for scooting back into chair for proper positioning;  Patient completed 2 car transfers requiring mod VCs for proper positioning and set up and increased time for proper positioning.  Following car transfers he required mod A +2 for sliding back in power chair for optimal sitting position;   Response to treatment: Pt reports increased fatigue but denies any pain at end of session. Patient required increased cues for proper hand placement and positioning for optimal safety awareness with transfers. He would benefit from additional skilled intervention for better safety awareness with patient and family to improve success and ease with car transfers;   [name] condition has the potential to improve in response to therapy. Maximum improvement is yet to be obtained. The anticipated improvement is attainable and reasonable in a generally predictable time.  Patient reports adherence with HEP and reports trying to use recumbent bike at home. He has also been doing better with moving his power chair in the home not bumping into objects.                     PT Education - 03/19/19 1258    Education Details  car transfers;    Person(s) Educated  Patient    Methods  Explanation;Verbal cues    Comprehension  Verbalized understanding;Verbal cues required;Returned demonstration       PT Short Term Goals - 03/20/19 0846      PT SHORT TERM GOAL #1   Title  Be independent with initial home exercise program for self-management of symptoms.    Baseline  HEP to be given at second session; advice to practice unsupported sitting at home (06/19/2018); 07/08/18: Performing at home, 6/29: needs assistance but is doing exercise program regularly; 08/22/18: adherent;    Time  2    Period  Weeks     Status  Achieved    Target Date  06/26/18        PT Long Term Goals - 03/20/19 0847      PT LONG TERM GOAL #1   Title  Patient will complete all bed mobility with min A to improve functional independence for getting in and out of bed and adjusting in bed.     Baseline  max A - total A reported by family (06/19/2018); 07/08/18: still requires maxA, dtr reports improvement in rolling since starting therapy; 6/23: min A to supervision in clinic; not doing at home right; 8/5: Pt's  daughter states that his rolling has improved, but still has difficulty with all other bed mobility; 10/23/18: pts daughter states that she is doing about 35% of the work if he is in proper position, he does uses the bed rails to help roll;  using Harrel Lemon for coming to sit EOB; 10/12: pts daughter states that she is doing about 30% of the work if he is in proper position, he does uses the bed rails to help roll, but it is improving;  using Hoyer for coming to sit EOB, 10/29: min A for sit to sidelying, rolling supervision, able to transition sidelying to sit with HHA;    Time  12    Period  Weeks    Status  Achieved    Target Date  12/04/18      PT LONG TERM GOAL #2   Title  Patient will complete sit <> stand transfer chair to chair with Min A and LRAD to improve functional independence for household and community mobility.     Baseline  variable min-modA for STS, still requires elevated surface height to 27"    Time  12    Period  Weeks    Status  Partially Met    Target Date  05/23/19      PT LONG TERM GOAL #3   Title  Patient will navigate power w/c with min A x 100 feet to improve mobility for household and short community distances.    Baseline  required total A (06/12/2018); able to roll 20 feet with min A (06/20/2018); 07/08/18: Pt can go approximately 10' without assist per family;  10/23/2018: pt's daughter states that his power WC mobility has become 100% better with the new hand control, slowed speed, and with  practice; 10/12: pt's daughter reports that he can perform household WC mobility and limited community ambulation with minA    Time  12    Period  Weeks    Status  Achieved    Target Date  05/23/19      PT LONG TERM GOAL #4   Title  Patient will complete car transfers W/C to family SUV with min A using LRAD to improve his abilty to participate in community activities.     Baseline  unable to complete car transfers at all (06/12/2018); unable to complete car transfers at all (06/19/2018).; 07/08/18: Unable to perform at this time; 09/11/18: unable to perform at this time; 10/23/2018: unable to complete car transfers at all; 10/12:  unable to complete car transfers at all, 10/29: unable; 01/13/19: unable; 2/11: min-mod A +2    Time  12    Period  Weeks    Status  Partially Met    Target Date  05/23/19      PT LONG TERM GOAL #5   Title  Patient will ambulate at least 10 feet with min A using LRAD to improve mobility for household and community distances.    Baseline  unable to take steps (06/12/2018); able to shift R leg forward and back with max A and RW at edge of plinth (06/19/2018); 07/08/18: unable to ambulate at this time. 09/11/18: unable to perform at this time; 10/23/2018: unable to perform at this time; 10/12: unable to perform at this time, 10/29: unable at this time; 01/13/19: unable at this time, 2/11: min A to step with RLE, unable to step with LLE    Time  12    Period  Weeks    Status  Revised  Target Date  05/23/19      PT LONG TERM GOAL #6   Title  Patient will be able to safety navigate up/down ramp with power chair, exhibiting good safety awareness, mod I to safely enter/exit his home.    Baseline  2/11: supervision with intermittent min A    Time  12    Period  Weeks    Status  On-going    Target Date  05/23/19      PT LONG TERM GOAL #7   Title  Patient will increase functional reach test to >15 inches in sitting to exhibit improved sitting balance and positioning and reduce fall  risk;    Baseline  07/30/18: 12 inches; 09/11/18: 10-12 inches; 10/23/18: 12-13 inch; 10/12: 14.5", 10/29: 15.5 inch    Time  4    Period  Weeks    Status  Achieved    Target Date  12/04/18      PT LONG TERM GOAL #8   Title  Patient will increase LLE quad strength to 3+/5 to improve functional strength for standing and mobility;    Baseline  10/28: 2+/5; 01/13/19: 2+/5, 2/11: 3-/5    Time  12    Period  Weeks    Status  Partially Met    Target Date  05/23/19            Plan - 03/20/19 0844    Clinical Impression Statement  Patient motivated and participated well within session. he was instructed in car transfers requiring cues for proper positioning/hand placement for better transfer ability. Utilized handybar to assist with better hand placement for transfer. Patient does have difficulty taking step for stand pivot transfer. However he is exhibit improved awareness with proper positioning prior to transfer for better safety. Patient does fatigue quickly with prolonged standing. Overall he is progressing in strength exhibiting improved LLE AROM against gravity, although is unable to achieve full ROM. He is also progressing in transfers being supevision in parallel bars and min A for squat pivot.  He would benefit from additional skilled PT Intervention to improve strength, balance and mobility;    Comorbidities  Relevant past medical history and comorbidities include long term steroid use for lupus, CKD, chronic osteomyelitis, cervical spine stenosis, BPH, APS, alcohol abuse, peripheral artery disease, depression, GERD, obstructive sleep apnea, HTN, IBS, IgA deficiency, lumbar radiculopathy, RA, Stroke, toxic maculopathy in both eyes, systemic lupus erythematosus related syndrome, cardiac catheterization, carotid endarterectomy, cervical laminectomy, coronary angioplasty, Fusion C5-C7, knee arthroplasty, lumbar surgery.    Rehab Potential  Fair    PT Frequency  3x / week    PT Duration  12  weeks    PT Treatment/Interventions  ADLs/Self Care Home Management;Electrical Stimulation;Moist Heat;Cryotherapy;Gait training;Stair training;Functional mobility training;Therapeutic exercise;Balance training;Neuromuscular re-education;Cognitive remediation;Patient/family education;Orthotic Fit/Training;Wheelchair mobility training;Manual techniques;Passive range of motion;Energy conservation;Joint Manipulations    PT Next Visit Plan  Work on strength, sitting/standing balance, functional activities such as rolling, bed mobility, transfers; caregiver training    PT Home Exercise Plan  no updates this session    Consulted and Agree with Plan of Care  Patient;Family member/caregiver    Family Member Consulted  daughter [name]       Patient will benefit from skilled therapeutic intervention in order to improve the following deficits and impairments:  Abnormal gait, Decreased activity tolerance, Decreased cognition, Decreased endurance, Decreased knowledge of use of DME, Decreased range of motion, Decreased skin integrity, Decreased strength, Impaired perceived functional ability, Impaired sensation, Impaired UE functional use,  Improper body mechanics, Pain, Cardiopulmonary status limiting activity, Decreased balance, Decreased coordination, Decreased mobility, Difficulty walking, Impaired tone, Postural dysfunction  Visit Diagnosis: Muscle weakness (generalized)  Other lack of coordination  Difficulty in walking, not elsewhere classified  Repeated falls     Problem List Patient Active Problem List   Diagnosis Date Noted  . Sepsis ([place]) 10/18/2018    Phoua Hoadley PT, DPT 03/20/2019, 8:58 AM  [place] MAIN [facility] SERVICES [address] [place], [place], [address] Phone: [phone]   Fax:  [phone]  Name: Kenneth Summers MRN: [id number] Date of Birth: 1945/07/29

## 2019-03-20 ENCOUNTER — Encounter: Payer: Self-pay | Admitting: Physical Therapy

## 2019-03-20 ENCOUNTER — Ambulatory Visit: Payer: Medicare Other | Admitting: Physical Therapy

## 2019-03-20 ENCOUNTER — Other Ambulatory Visit: Payer: Self-pay

## 2019-03-20 ENCOUNTER — Encounter: Payer: Self-pay | Admitting: Occupational Therapy

## 2019-03-20 ENCOUNTER — Ambulatory Visit: Payer: Medicare Other | Admitting: Occupational Therapy

## 2019-03-20 DIAGNOSIS — R262 Difficulty in walking, not elsewhere classified: Secondary | ICD-10-CM

## 2019-03-20 DIAGNOSIS — R278 Other lack of coordination: Secondary | ICD-10-CM

## 2019-03-20 DIAGNOSIS — M6281 Muscle weakness (generalized): Secondary | ICD-10-CM

## 2019-03-20 DIAGNOSIS — R296 Repeated falls: Secondary | ICD-10-CM

## 2019-03-20 NOTE — Therapy (Signed)
[place] MAIN [facility] SERVICES [address] [place], [place], [address] Phone: [phone]   Fax:  [phone]  Physical Therapy Treatment  Patient Details  Name: Kenneth Summers MRN: [id number] Date of Birth: Jun 14, 1945 Referring Provider (PT): Dr. [name], Guy Begin   Encounter Date: 03/20/2019  PT End of Session - 03/20/19 1546    Visit Number  101    Number of Visits  144    Date for PT Re-Evaluation  05/23/19    Authorization Type  Medicare reporting period starting 09/11/18    Authorization Time Period  goals last assessed 03/19/19    PT Start Time  1345    PT Stop Time  1430    PT Time Calculation (min)  45 min    Equipment Utilized During Treatment  Gait belt    Activity Tolerance  Patient tolerated treatment well;No increased pain    Behavior During Therapy  WFL for tasks assessed/performed       Past Medical History:  Diagnosis Date  . Alcohol abuse   . APS (antiphospholipid syndrome) ([place])   . BPH (benign prostatic hyperplasia)   . CAD (coronary artery disease)   . Cellulitis   . Cervical spinal stenosis   . Chronic osteomyelitis ([place])   . CKD (chronic kidney disease)   . CKD (chronic kidney disease)   . Clostridium difficile diarrhea   . Depression   . Diplopia   . Fatigue   . GERD (gastroesophageal reflux disease)   . Hyperlipemia   . Hypertension   . Hypovitaminosis D   . IBS (irritable bowel syndrome)   . 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 Insomnia   . Left lumbosacral radiculopathy   . Moderate obstructive sleep apnea   . Osteomyelitis of foot ([place])   . PAD (peripheral artery disease) ([place])   . Pruritus   . RA (rheumatoid arthritis) ([place])   . Radiculopathy   . Restless legs syndrome   . RLS (restless legs syndrome)   . SLE (systemic lupus erythematosus related syndrome) ([place])   . Stroke ([place])   . Toxic maculopathy of both eyes     Past Surgical History:  Procedure Laterality Date  . CARDIAC  CATHETERIZATION    . CAROTID ENDARTERECTOMY    . CERVICAL LAMINECTOMY    . CORONARY ANGIOPLASTY    . FRACTURE SURGERY    . fusion C5-6-7    . HERNIA REPAIR    . KNEE ARTHROSCOPY    . TONSILLECTOMY      There were no vitals filed for this visit.  Subjective Assessment - 03/20/19 1355    Subjective  Patient reports doing well; denies any pain;    Patient is accompained by:  Family member    Pertinent History  Patient is a 74 y.o. male who presents to outpatient physical therapy with a referral for medical diagnosis of CVA. This patient's chief complaints consist of left hemiplegia and overall deconditioning leading to the following functional deficits: dependent for ADLs, IADLs, unable to transfer to car, dependent transfers at home with hoyer lift, unable to walk or stand without significant assistance, difficulty with W/C navigation..    Currently in Pain?  No/denies         TREATMENT: Pt instructed in transfers  Transferred squat pivot wheelchair to mat table min A +1 with min Vcs for hand placement and positioning; He does require increased time for proper positioning for optimal transfer ability;  Adjusted mat table to 27  inch height for optimal transfer ability; Pt transferred sit<>Stand from mat table with RW x2 reps, min A +1 with min Vcs for forward weight shift and to increase push through LE He was able to stand for approximately 30-45 sec each repetition. Patient able to achieve midline weight shift with less posterior lean for increased LE weight bearing. He does fatigue quickly with prolonged standing;  Progressed transfers with stand pivot with RW, mod A +1 with mod VCs for positioning including weight shift and foot placement. He was unable to step with RLE due to poor quad control on LLE and weakness. Patient was able to transfer mat table to wheelchair with stand pivot with RW with mod A +1.   Following transfers: Patient sitting in wheelchair: Instructed patient  in LE strengthening exercise: 2# ankle weights around BLE: -hip flexion march 2 x10 reps bilaterally; -LAQ 2x10 reps bilaterally  Partial ROM on LLE due to weakness with mod VCs for correct exercise technique and positioning for increased muscle activation;  Patient tolerated session well. He was able to progress transfers with RW with improved weight shift and stance control. He is also progressing in LE strength with increased ROM noted. Patient would benefit from additional skilled PT intervention for strength and mobility;                       PT Education - 03/20/19 1546    Education Details  transfers/exercise    Person(s) Educated  Patient    Methods  Explanation;Verbal cues    Comprehension  Verbalized understanding;Returned demonstration;Verbal cues required;Need further instruction       PT Short Term Goals - 03/20/19 0846      PT SHORT TERM GOAL #1   Title  Be independent with initial home exercise program for self-management of symptoms.    Baseline  HEP to be given at second session; advice to practice unsupported sitting at home (06/19/2018); 07/08/18: Performing at home, 6/29: needs assistance but is doing exercise program regularly; 08/22/18: adherent;    Time  2    Period  Weeks    Status  Achieved    Target Date  06/26/18        PT Long Term Goals - 03/20/19 0847      PT LONG TERM GOAL #1   Title  Patient will complete all bed mobility with min A to improve functional independence for getting in and out of bed and adjusting in bed.     Baseline  max A - total A reported by family (06/19/2018); 07/08/18: still requires maxA, dtr reports improvement in rolling since starting therapy; 6/23: min A to supervision in clinic; not doing at home right; 8/5: Pt's daughter states that his rolling has improved, but still has difficulty with all other bed mobility; 10/23/18: pts daughter states that she is doing about 35% of the work if he is in proper position, he  does uses the bed rails to help roll;  using Hoyer for coming to sit EOB; 10/12: pts daughter states that she is doing about 30% of the work if he is in proper position, he does uses the bed rails to help roll, but it is improving;  using Hoyer for coming to sit EOB, 10/29: min A for sit to sidelying, rolling supervision, able to transition sidelying to sit with HHA;    Time  12    Period  Weeks    Status  Achieved    Target  Date  12/04/18      PT LONG TERM GOAL #2   Title  Patient will complete sit <> stand transfer chair to chair with Min A and LRAD to improve functional independence for household and community mobility.     Baseline  variable min-modA for STS, still requires elevated surface height to 27"    Time  12    Period  Weeks    Status  Partially Met    Target Date  05/23/19      PT LONG TERM GOAL #3   Title  Patient will navigate power w/c with min A x 100 feet to improve mobility for household and short community distances.    Baseline  required total A (06/12/2018); able to roll 20 feet with min A (06/20/2018); 07/08/18: Pt can go approximately 10' without assist per family;  10/23/2018: pt's daughter states that his power WC mobility has become 100% better with the new hand control, slowed speed, and with practice; 10/12: pt's daughter reports that he can perform household WC mobility and limited community ambulation with minA    Time  12    Period  Weeks    Status  Achieved    Target Date  05/23/19      PT LONG TERM GOAL #4   Title  Patient will complete car transfers W/C to family SUV with min A using LRAD to improve his abilty to participate in community activities.     Baseline  unable to complete car transfers at all (06/12/2018); unable to complete car transfers at all (06/19/2018).; 07/08/18: Unable to perform at this time; 09/11/18: unable to perform at this time; 10/23/2018: unable to complete car transfers at all; 10/12:  unable to complete car transfers at all, 10/29: unable;  01/13/19: unable; 2/11: min-mod A +2    Time  12    Period  Weeks    Status  Partially Met    Target Date  05/23/19      PT LONG TERM GOAL #5   Title  Patient will ambulate at least 10 feet with min A using LRAD to improve mobility for household and community distances.    Baseline  unable to take steps (06/12/2018); able to shift R leg forward and back with max A and RW at edge of plinth (06/19/2018); 07/08/18: unable to ambulate at this time. 09/11/18: unable to perform at this time; 10/23/2018: unable to perform at this time; 10/12: unable to perform at this time, 10/29: unable at this time; 01/13/19: unable at this time, 2/11: min A to step with RLE, unable to step with LLE    Time  12    Period  Weeks    Status  Revised    Target Date  05/23/19      PT LONG TERM GOAL #6   Title  Patient will be able to safety navigate up/down ramp with power chair, exhibiting good safety awareness, mod I to safely enter/exit his home.    Baseline  2/11: supervision with intermittent min A    Time  12    Period  Weeks    Status  On-going    Target Date  05/23/19      PT LONG TERM GOAL #7   Title  Patient will increase functional reach test to >15 inches in sitting to exhibit improved sitting balance and positioning and reduce fall risk;    Baseline  07/30/18: 12 inches; 09/11/18: 10-12 inches; 10/23/18: 12-13 inch; 10/12: 14.5", 10/29:  15.5 inch    Time  4    Period  Weeks    Status  Achieved    Target Date  12/04/18      PT LONG TERM GOAL #8   Title  Patient will increase LLE quad strength to 3+/5 to improve functional strength for standing and mobility;    Baseline  10/28: 2+/5; 01/13/19: 2+/5, 2/11: 3-/5    Time  12    Period  Weeks    Status  Partially Met    Target Date  05/23/19            Plan - 03/20/19 1546    Clinical Impression Statement  Patient motivated and participated well within session. He was instructed in transfers with instruction for forward weight shift for better sit<>stand  ability; Patient was able to exhibit improved standing balance with finding midline with less posterior loss of balance. Progressed transfers to stand pivot with RW with mod A +1 with slight unsteadiness noted. Patient did have difficulty stepping with RLE due to weakness in LLE being unable to extend LLE knee. Patient instructed in advanced LE strengthening with improved ROM noted. He would benefit from additional skilled PT Intervention to improve strength, balance and mobility;    Comorbidities  Relevant past medical history and comorbidities include long term steroid use for lupus, CKD, chronic osteomyelitis, cervical spine stenosis, BPH, APS, alcohol abuse, peripheral artery disease, depression, GERD, obstructive sleep apnea, HTN, IBS, IgA deficiency, lumbar radiculopathy, RA, Stroke, toxic maculopathy in both eyes, systemic lupus erythematosus related syndrome, cardiac catheterization, carotid endarterectomy, cervical laminectomy, coronary angioplasty, Fusion C5-C7, knee arthroplasty, lumbar surgery.    Rehab Potential  Fair    PT Frequency  3x / week    PT Duration  12 weeks    PT Treatment/Interventions  ADLs/Self Care Home Management;Electrical Stimulation;Moist Heat;Cryotherapy;Gait training;Stair training;Functional mobility training;Therapeutic exercise;Balance training;Neuromuscular re-education;Cognitive remediation;Patient/family education;Orthotic Fit/Training;Wheelchair mobility training;Manual techniques;Passive range of motion;Energy conservation;Joint Manipulations    PT Next Visit Plan  Work on strength, sitting/standing balance, functional activities such as rolling, bed mobility, transfers; caregiver training    PT Home Exercise Plan  no updates this session    Consulted and Agree with Plan of Care  Patient;Family member/caregiver    Family Member Consulted  daughter [name]       Patient will benefit from skilled therapeutic intervention in order to improve the following deficits  and impairments:  Abnormal gait, Decreased activity tolerance, Decreased cognition, Decreased endurance, Decreased knowledge of use of DME, Decreased range of motion, Decreased skin integrity, Decreased strength, Impaired perceived functional ability, Impaired sensation, Impaired UE functional use, Improper body mechanics, Pain, Cardiopulmonary status limiting activity, Decreased balance, Decreased coordination, Decreased mobility, Difficulty walking, Impaired tone, Postural dysfunction  Visit Diagnosis: Muscle weakness (generalized)  Other lack of coordination  Difficulty in walking, not elsewhere classified  Repeated falls     Problem List Patient Active Problem List   Diagnosis Date Noted  . Sepsis ([place]) 10/18/2018    Natlie Asfour PT, DPT 03/20/2019, 3:56 PM  [place] MAIN [facility] SERVICES [address] [place], [place], [address] Phone: [phone]   Fax:  [phone]  Name: Kenneth Summers MRN: [id number] Date of Birth: November 21, 1945

## 2019-03-20 NOTE — Therapy (Signed)
[place] MAIN [facility] SERVICES [address] [place], [place], [address] Phone: [phone]   Fax:  [phone]  Occupational Therapy Progress Note  Dates of reporting period  12/26/2019   to   03/20/2019  Patient Details  Name: Kenneth Summers MRN: [id number] Date of Birth: 1946-01-20 No data recorded  Encounter Date: 03/20/2019  OT End of Session - 03/20/19 1309    Visit Number  90    Number of Visits  108    Date for OT Re-Evaluation  04/07/19    Authorization Type  Progress report period starting 12/26/2018    OT Start Time  1300    OT Stop Time  1345    OT Time Calculation (min)  45 min    Activity Tolerance  Patient tolerated treatment well;Patient limited by fatigue    Behavior During Therapy  [facility] for tasks assessed/performed       Past Medical History:  Diagnosis Date  . Alcohol abuse   . APS (antiphospholipid syndrome) ([place])   . BPH (benign prostatic hyperplasia)   . CAD (coronary artery disease)   . Cellulitis   . Cervical spinal stenosis   . Chronic osteomyelitis ([place])   . CKD (chronic kidney disease)   . CKD (chronic kidney disease)   . Clostridium difficile diarrhea   . Depression   . Diplopia   . Fatigue   . GERD (gastroesophageal reflux disease)   . Hyperlipemia   . Hypertension   . Hypovitaminosis D   . IBS (irritable bowel syndrome)   . IgA deficiency ([place])   . Insomnia   . Left lumbosacral radiculopathy   . Moderate obstructive sleep apnea   . Osteomyelitis of foot ([place])   . PAD (peripheral artery disease) ([place])   . Pruritus   . RA (rheumatoid arthritis) ([place])   . Radiculopathy   . Restless legs syndrome   . RLS (restless legs syndrome)   . SLE (systemic lupus erythematosus related syndrome) ([place])   . Stroke ([place])   . Toxic maculopathy of both eyes     Past Surgical History:  Procedure Laterality Date  . CARDIAC CATHETERIZATION    . CAROTID ENDARTERECTOMY    . CERVICAL LAMINECTOMY    .  CORONARY ANGIOPLASTY    . FRACTURE SURGERY    . fusion C5-6-7    . HERNIA REPAIR    . KNEE ARTHROSCOPY    . TONSILLECTOMY      There were no vitals filed for this visit.  Subjective Assessment - 03/20/19 1307    Subjective   Pt. had apodiatry appointment this morning.    Patient is accompanied by:  Family member    Pertinent History  Pt. is a 74 y.o. 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 Pt. PMHx includes: Multiple Falls, Lupus, DJD, Renal Abscess, CVA. Pt. resides with his wife. Pt.'s wife and daughter assist with ADLs. Pt. has caregivers in  for 2 hours a day, 6 days a week. Pt. received Rehab services in acute care, at SNF for STR, and [place] services. Pt. is retired from [employer] for [employer] and [employer].    Currently in Pain?  No/denies       Therapeutic Exercise:  Pt. performed 2.0#, followed by 2.5# dowel ex. For UE strengthening secondary to weakness. Bilateral shoulder flexion, chest press, circular patternsusing a mirror for proper form.   Neuromuscular Re-ed:  Pt. worked on using his left  hand for grasping, and manipulating 1", and 1/2" nuts, and bolts on a vertical tower. Pt. required consistent cues to engage his left hand.  Pt. Requires increased time to complete.   Response to Treatment:  Pt. required fewer restbreaks today. Pt. was able to tolerate increased resistance today during ther. ex. Pt. continues to present with LUE weakness, impaired motor control, and [facility] skills. Pt. continues to work on improving UE strength, and functioning in order to work towards improving, and maximizing independence with ADLs, and IADLs.                   OT Education - 03/20/19 1309    Education Details  LUE functioning    Person(s) Educated  Patient    Methods  Explanation;Demonstration    Comprehension  Returned demonstration;Tactile cues required;Verbalized understanding;Need further instruction           OT Long Term Goals - 03/20/19 1311      OT LONG TERM GOAL #1   Title  Pt. will increase left shoulder flexion AROM by 10 degrees to access cabinet/shelf.    Baseline  Pt. continues to improve with LUE ROM, and functional reaching.    Time  12    Period  Weeks    Status  On-going    Target Date  04/07/19      OT LONG TERM GOAL #2   Title  Pt. will donn a shirt with Supervision.    Baseline  MinA donning a zip down jacket    Time  12    Period  Weeks    Status  On-going    Target Date  04/07/19      OT LONG TERM GOAL #4   Title  Pt. will improve left grip strength by 10# to be able to open a jar/container.    Baseline  pt. continues to make progress with left grip strength, continues to work onopening jars, and containers.    Time  12    Period  Weeks    Status  On-going    Target Date  04/07/19      OT LONG TERM GOAL #5   Title  Pt. will improve left hand [facility] skills to be able to assist with buttoning/zipping.    Baseline  Pt. has made excellent progress with bilateral [facility] skills. Pt. continues to work on improving bilateral [facility] skills for buttoning/zipping    Time  12    Period  Weeks    Status  On-going    Target Date  04/07/19      OT LONG TERM GOAL #6   Title  Pt. will demonstrate visual conmpensatory strategies during 100% of the time during ADLs    Baseline  Pt. requires cues at times for left sides awareness.    Time  12    Period  Weeks    Status  On-going    Target Date  04/07/19      OT LONG TERM GOAL #8   Title  Pt. will accurately identify potential safety hazard using good safety awareness, and judgement 100% for ADLs, and IADLs.    Baseline  Pt. continues to be limited    Time  12    Period  Weeks    Status  On-going    Target Date  04/07/19      OT LONG TERM GOAL  #9   TITLE  Pt. will  navigate his w/c around obstacles with Supervision and 100% accuracy  Baseline  Pt. continues to require consistent cuing for obstacle on the left     Time  12    Status  On-going    Target Date  04/07/19      OT LONG TERM GOAL  #11   TITLE  Pt. will increase BUE strength by 5mm grades in preparation for ADL transfers.    Baseline  Limited BUE strength    Time  12    Period  Weeks    Status  On-going    Target Date  04/07/19            Plan - 03/20/19 1310    Clinical Impression Statement  Pt. required fewer restbreaks today. Pt. was able to tolerate increased resistance today during ther. ex. Pt. continues to present with LUE weakness, impaired motor control, and [place] skills. Pt. continues to work on improving UE strength, and functioning in order to work towards improving, and maximizing independence with ADLs, and IADLs.    OT Occupational Profile and History  Problem Focused Assessment - Including review of records relating to presenting problem    Occupational performance deficits (Please refer to evaluation for details):  ADL's    Body Structure / Function / Physical Skills  UE functional use;Coordination;FMC;Dexterity;Strength;ROM    Cognitive Skills  Attention;Memory;Emotional;Problem Solve;Safety Awareness    Rehab Potential  Good    Clinical Decision Making  Several treatment options, min-mod task modification necessary    Comorbidities Affecting Occupational Performance:  Presence of comorbidities impacting occupational performance    Modification or Assistance to Complete Evaluation   No modification of tasks or assist necessary to complete eval    OT Frequency  2x / week    OT Duration  12 weeks    OT Treatment/Interventions  Self-care/ADL training;DME and/or AE instruction;Therapeutic exercise;Therapeutic activities;Moist Heat;Cognitive remediation/compensation;Neuromuscular education;Visual/perceptual remediation/compensation;Coping strategies training;Patient/family education;Passive range of motion;Psychosocial skills training;Energy conservation;Functional Mobility Training    Consulted and Agree with Plan of Care   Patient    Family Member Consulted  Daughter [name]       Patient will benefit from skilled therapeutic intervention in order to improve the following deficits and impairments:   Body Structure / Function / Physical Skills: UE functional use, Coordination, FMC, Dexterity, Strength, ROM Cognitive Skills: Attention, Memory, Emotional, Problem Solve, Safety Awareness     Visit Diagnosis: Muscle weakness (generalized)  Other lack of coordination    Problem List Patient Active Problem List   Diagnosis Date Noted  . Sepsis ([place]) 10/18/2018    Harrel Carina, MS, OTR/L 03/20/2019, 1:23 PM  [place] MAIN [facility] SERVICES [address] [place], [place], [address] Phone: [phone]   Fax:  [phone]  Name: TOSHIO MCKIERNAN MRN: [id number] Date of Birth: 1945/03/08

## 2019-03-24 ENCOUNTER — Ambulatory Visit: Payer: Medicare Other | Admitting: Occupational Therapy

## 2019-03-24 ENCOUNTER — Ambulatory Visit: Payer: Medicare Other | Admitting: Physical Therapy

## 2019-03-24 ENCOUNTER — Other Ambulatory Visit: Payer: Self-pay

## 2019-03-24 ENCOUNTER — Encounter: Payer: Self-pay | Admitting: Occupational Therapy

## 2019-03-24 ENCOUNTER — Encounter: Payer: Self-pay | Admitting: Physical Therapy

## 2019-03-24 DIAGNOSIS — R262 Difficulty in walking, not elsewhere classified: Secondary | ICD-10-CM

## 2019-03-24 DIAGNOSIS — M6281 Muscle weakness (generalized): Secondary | ICD-10-CM | POA: Diagnosis not present

## 2019-03-24 DIAGNOSIS — R278 Other lack of coordination: Secondary | ICD-10-CM

## 2019-03-24 DIAGNOSIS — R296 Repeated falls: Secondary | ICD-10-CM

## 2019-03-24 NOTE — Therapy (Addendum)
[place] MAIN [facility] SERVICES [address] [place], [place], [address] Phone: [phone]   Fax:  [phone]  Occupational Therapy Treatment  Patient Details  Name: Kenneth Summers MRN: [id number] Date of Birth: 10/31/1945 No data recorded  Encounter Date: 03/24/2019  OT End of Session - 03/24/19 1440    Visit Number  91    Number of Visits  132    Date for OT Re-Evaluation  04/07/19    Authorization Type  Progress report period starting 12/26/2018    OT Start Time  1345    OT Stop Time  1428    OT Time Calculation (min)  43 min    Activity Tolerance  Patient tolerated treatment well;Patient limited by fatigue    Behavior During Therapy  [facility] for tasks assessed/performed       Past Medical History:  Diagnosis Date  . Alcohol abuse   . APS (antiphospholipid syndrome) ([place])   . BPH (benign prostatic hyperplasia)   . CAD (coronary artery disease)   . Cellulitis   . Cervical spinal stenosis   . Chronic osteomyelitis ([place])   . CKD (chronic kidney disease)   . CKD (chronic kidney disease)   . Clostridium difficile diarrhea   . Depression   . Diplopia   . Fatigue   . GERD (gastroesophageal reflux disease)   . Hyperlipemia   . Hypertension   . Hypovitaminosis D   . IBS (irritable bowel syndrome)   . 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 Insomnia   . Left lumbosacral radiculopathy   . Moderate obstructive sleep apnea   . Osteomyelitis of foot ([place])   . PAD (peripheral artery disease) ([place])   . Pruritus   . RA (rheumatoid arthritis) ([place])   . Radiculopathy   . Restless legs syndrome   . RLS (restless legs syndrome)   . SLE (systemic lupus erythematosus related syndrome) ([place])   . Stroke ([place])   . Toxic maculopathy of both eyes     Past Surgical History:  Procedure Laterality Date  . CARDIAC CATHETERIZATION    . CAROTID ENDARTERECTOMY    . CERVICAL LAMINECTOMY    . CORONARY ANGIOPLASTY    . FRACTURE SURGERY    . fusion C5-6-7    .  HERNIA REPAIR    . KNEE ARTHROSCOPY    . TONSILLECTOMY      There were no vitals filed for this visit.  OT TREATMENT    Neuro muscular re-education:  Pt. worked on using his left hand for grasping 1" resistive cubes alternating thumb opposition to the tip of the 2nd through 5th digits while the board is placed at a vertical angle. Pt. worked on pressing the cubes back into place while alternating isolated 2nd through 5th digit extension.  Therapeutic Exercise:  Pt. performed 2.5 # dowel ex. For BUE strengthening secondary to weakness. Bilateral shoulder flexion, chest press, and circular patterns, and were performed. 2 sets 10-20 reps. Pt. used a mirror for visual cues. 3# dumbbell ex. for elbow flexion and extension, 2# for forearm supination/pronation, and wrist flexion/extension on the left, and 3# for the right. Pt. required rest breaks and verbal cues for proper technique.  Response to Treatment:  Pt., and his daughter were provided with A visual handout for magnetic buttons. Pt. was fatigued today during the OT session following PT. Pt. required rest breaks, and initially reported left shoulder discomfort. Pt. responded very well to gentle scapular, shoulder, and neck stretches prior to  ther. ex. Pt. continues to present with limited LUE strength, and [facility] skills.  Pt. Requires cues to use his left hand. Pt. continues to work on improving UE strength, and [facility] skills in order to work towards improving, and maximizing independence with overall ADLs, and IADL tasks.                       OT Education - 03/24/19 1440    Education Details  LUE functioning    Person(s) Educated  Patient    Methods  Explanation;Demonstration    Comprehension  Returned demonstration;Tactile cues required;Verbalized understanding;Need further instruction          OT Long Term Goals - 03/20/19 1311      OT LONG TERM GOAL #1   Title  Pt. will increase left shoulder flexion AROM by  10 degrees to access cabinet/shelf.    Baseline  Pt. continues to improve with LUE ROM, and functional reaching.    Time  12    Period  Weeks    Status  On-going    Target Date  04/07/19      OT LONG TERM GOAL #2   Title  Pt. will donn a shirt with Supervision.    Baseline  MinA donning a zip down jacket    Time  12    Period  Weeks    Status  On-going    Target Date  04/07/19      OT LONG TERM GOAL #4   Title  Pt. will improve left grip strength by 10# to be able to open a jar/container.    Baseline  pt. continues to make progress with left grip strength, continues to work onopening jars, and containers.    Time  12    Period  Weeks    Status  On-going    Target Date  04/07/19      OT LONG TERM GOAL #5   Title  Pt. will improve left hand [facility] skills to be able to assist with buttoning/zipping.    Baseline  Pt. has made excellent progress with bilateral [facility] skills. Pt. continues to work on improving bilateral [facility] skills for buttoning/zipping    Time  12    Period  Weeks    Status  On-going    Target Date  04/07/19      OT LONG TERM GOAL #6   Title  Pt. will demonstrate visual conmpensatory strategies during 100% of the time during ADLs    Baseline  Pt. requires cues at times for left sides awareness.    Time  12    Period  Weeks    Status  On-going    Target Date  04/07/19      OT LONG TERM GOAL #8   Title  Pt. will accurately identify potential safety hazard using good safety awareness, and judgement 100% for ADLs, and IADLs.    Baseline  Pt. continues to be limited    Time  12    Period  Weeks    Status  On-going    Target Date  04/07/19      OT LONG TERM GOAL  #9   TITLE  Pt. will  navigate his w/c around obstacles with Supervision and 100% accuracy    Baseline  Pt. continues to require consistent cuing for obstacle on the left    Time  12    Status  On-going    Target Date  04/07/19  OT LONG TERM GOAL  #11   TITLE  Pt. will increase BUE strength by 60mm  grades in preparation for ADL transfers.    Baseline  Limited BUE strength    Time  12    Period  Weeks    Status  On-going    Target Date  04/07/19            Plan - 03/24/19 1440    Clinical Impression Statement Pt., and his daughter were provided with A visual handout for magnetic buttons. Pt. was fatigued today during the OT session following PT. Pt. required rest breaks, and initially reported left shoulder discomfort. Pt. responded very well to gentle scapular, shoulder, and neck stretches prior to  ther. ex. Pt. continues to present with limited LUE strength, and [facility] skills.  Pt. Requires cues to use his left hand. Pt. continues to work on improving UE strength, and [facility] skills in order to work towards improving, and maximizing independence with overall ADLs, and IADL tasks.   OT Occupational Profile and History  Problem Focused Assessment - Including review of records relating to presenting problem    Occupational performance deficits (Please refer to evaluation for details):  ADL's    Body Structure / Function / Physical Skills  UE functional use;Coordination;FMC;Dexterity;Strength;ROM    Cognitive Skills  Attention;Memory;Emotional;Problem Solve;Safety Awareness    Rehab Potential  Good    Clinical Decision Making  Several treatment options, min-mod task modification necessary    Comorbidities Affecting Occupational Performance:  Presence of comorbidities impacting occupational performance    Comorbidities impacting occupational performance description:  Phyical, cognitive, visual,  medical comorbidities    Modification or Assistance to Complete Evaluation   No modification of tasks or assist necessary to complete eval    OT Frequency  2x / week    OT Duration  12 weeks    OT Treatment/Interventions  Self-care/ADL training;DME and/or AE instruction;Therapeutic exercise;Therapeutic activities;Moist Heat;Cognitive remediation/compensation;Neuromuscular education;Visual/perceptual  remediation/compensation;Coping strategies training;Patient/family education;Passive range of motion;Psychosocial skills training;Energy conservation;Functional Mobility Training    Consulted and Agree with Plan of Care  Patient    Family Member Consulted  Daughter [name]       Patient will benefit from skilled therapeutic intervention in order to improve the following deficits and impairments:   Body Structure / Function / Physical Skills: UE functional use, Coordination, FMC, Dexterity, Strength, ROM Cognitive Skills: Attention, Memory, Emotional, Problem Solve, Safety Awareness     Visit Diagnosis: Muscle weakness (generalized)  Other lack of coordination    Problem List Patient Active Problem List   Diagnosis Date Noted  . Sepsis ([place]) 10/18/2018    Harrel Carina, MS, OTR/L 03/24/2019, 2:56 PM  [place] MAIN [facility] SERVICES [address] [place], [place], [address] Phone: [phone]   Fax:  [phone]  Name: WELDEN MANWELL MRN: [id number] Date of Birth: [birth year]

## 2019-03-24 NOTE — Therapy (Signed)
[place] MAIN [facility] SERVICES [address] [place], [place], [address] Phone: [phone]   Fax:  [phone]  Physical Therapy Treatment  Patient Details  Name: Kenneth Summers MRN: [id number] Date of Birth: Jul 24, 1945 Referring Provider (PT): Dr. [name], Guy Begin   Encounter Date: 03/24/2019  PT End of Session - 03/24/19 1310    Visit Number  102    Number of Visits  144    Date for PT Re-Evaluation  05/23/19    Authorization Type  Medicare reporting period starting 09/11/18    Authorization Time Period  goals last assessed 03/19/19    PT Start Time  1302    PT Stop Time  1345    PT Time Calculation (min)  43 min    Equipment Utilized During Treatment  Gait belt    Activity Tolerance  Patient tolerated treatment well;No increased pain    Behavior During Therapy  WFL for tasks assessed/performed       Past Medical History:  Diagnosis Date  . Alcohol abuse   . APS (antiphospholipid syndrome) ([place])   . BPH (benign prostatic hyperplasia)   . CAD (coronary artery disease)   . Cellulitis   . Cervical spinal stenosis   . Chronic osteomyelitis ([place])   . CKD (chronic kidney disease)   . CKD (chronic kidney disease)   . Clostridium difficile diarrhea   . Depression   . Diplopia   . Fatigue   . GERD (gastroesophageal reflux disease)   . Hyperlipemia   . Hypertension   . Hypovitaminosis D   . IBS (irritable bowel syndrome)   . 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 Insomnia   . Left lumbosacral radiculopathy   . Moderate obstructive sleep apnea   . Osteomyelitis of foot ([place])   . PAD (peripheral artery disease) ([place])   . Pruritus   . RA (rheumatoid arthritis) ([place])   . Radiculopathy   . Restless legs syndrome   . RLS (restless legs syndrome)   . SLE (systemic lupus erythematosus related syndrome) ([place])   . Stroke ([place])   . Toxic maculopathy of both eyes     Past Surgical History:  Procedure Laterality Date  . CARDIAC  CATHETERIZATION    . CAROTID ENDARTERECTOMY    . CERVICAL LAMINECTOMY    . CORONARY ANGIOPLASTY    . FRACTURE SURGERY    . fusion C5-6-7    . HERNIA REPAIR    . KNEE ARTHROSCOPY    . TONSILLECTOMY      There were no vitals filed for this visit.  Subjective Assessment - 03/24/19 1309    Subjective  Patient reports doing well; no pain; no new falls;    Patient is accompained by:  Family member    Pertinent History  Patient is a 74 y.o. male who presents to outpatient physical therapy with a referral for medical diagnosis of CVA. This patient's chief complaints consist of left hemiplegia and overall deconditioning leading to the following functional deficits: dependent for ADLs, IADLs, unable to transfer to car, dependent transfers at home with hoyer lift, unable to walk or stand without significant assistance, difficulty with W/C navigation..    Currently in Pain?  No/denies    Multiple Pain Sites  No             TREATMENT: Pt instructed in transfers  Transferred squat pivot wheelchair to mat table min A +1 with min Vcs for hand placement and positioning; He does require increased  time for proper positioning for optimal transfer ability;  Adjusted mat table to 27 inch height for optimal transfer ability; Pt transferred sit<>Stand from mat table with RW x5 reps, min A +1 with min Vcs for forward weight shift and to increase push through LE He was able to stand for approximately 10-15 sec each repetition. Patient exhibits increased posterior lean with increased difficulty finding midline. He also fatigues quickly;  Transferred  with stand pivot, mod A +1 with mod VCs for positioning including weight shift and foot placement. He was unable to step with RLE due to poor quad control on LLE and weakness.  Following transfers: Patient sittingin wheelchair: Instructed patient in LE strengthening exercise: 2# ankle weights around BLE: -hip flexion march x15 reps bilaterally; -LAQ  x15 reps bilaterally  Partial ROM on LLE due to weakness with mod VCs for correct exercise technique and positioning for increased muscle activation;  Patient tolerated session well. He was able to progress transfers with RW with improved weight shift and stance control. He is also progressing in LE strength with increased ROM noted. Patient would benefit from additional skilled PT intervention for strength and mobility;                     PT Education - 03/24/19 1309    Education Details  transfers/exercise    Person(s) Educated  Patient;Child(ren)    Methods  Explanation;Verbal cues    Comprehension  Verbalized understanding;Returned demonstration;Verbal cues required;Need further instruction       PT Short Term Goals - 03/20/19 0846      PT SHORT TERM GOAL #1   Title  Be independent with initial home exercise program for self-management of symptoms.    Baseline  HEP to be given at second session; advice to practice unsupported sitting at home (06/19/2018); 07/08/18: Performing at home, 6/29: needs assistance but is doing exercise program regularly; 08/22/18: adherent;    Time  2    Period  Weeks    Status  Achieved    Target Date  06/26/18        PT Long Term Goals - 03/20/19 0847      PT LONG TERM GOAL #1   Title  Patient will complete all bed mobility with min A to improve functional independence for getting in and out of bed and adjusting in bed.     Baseline  max A - total A reported by family (06/19/2018); 07/08/18: still requires maxA, dtr reports improvement in rolling since starting therapy; 6/23: min A to supervision in clinic; not doing at home right; 8/5: Pt's daughter states that his rolling has improved, but still has difficulty with all other bed mobility; 10/23/18: pts daughter states that she is doing about 35% of the work if he is in proper position, he does uses the bed rails to help roll;  using Hoyer for coming to sit EOB; 10/12: pts daughter states  that she is doing about 30% of the work if he is in proper position, he does uses the bed rails to help roll, but it is improving;  using Hoyer for coming to sit EOB, 10/29: min A for sit to sidelying, rolling supervision, able to transition sidelying to sit with HHA;    Time  12    Period  Weeks    Status  Achieved    Target Date  12/04/18      PT LONG TERM GOAL #2   Title  Patient will complete  sit <> stand transfer chair to chair with Min A and LRAD to improve functional independence for household and community mobility.     Baseline  variable min-modA for STS, still requires elevated surface height to 27"    Time  12    Period  Weeks    Status  Partially Met    Target Date  05/23/19      PT LONG TERM GOAL #3   Title  Patient will navigate power w/c with min A x 100 feet to improve mobility for household and short community distances.    Baseline  required total A (06/12/2018); able to roll 20 feet with min A (06/20/2018); 07/08/18: Pt can go approximately 10' without assist per family;  10/23/2018: pt's daughter states that his power WC mobility has become 100% better with the new hand control, slowed speed, and with practice; 10/12: pt's daughter reports that he can perform household WC mobility and limited community ambulation with minA    Time  12    Period  Weeks    Status  Achieved    Target Date  05/23/19      PT LONG TERM GOAL #4   Title  Patient will complete car transfers W/C to family SUV with min A using LRAD to improve his abilty to participate in community activities.     Baseline  unable to complete car transfers at all (06/12/2018); unable to complete car transfers at all (06/19/2018).; 07/08/18: Unable to perform at this time; 09/11/18: unable to perform at this time; 10/23/2018: unable to complete car transfers at all; 10/12:  unable to complete car transfers at all, 10/29: unable; 01/13/19: unable; 2/11: min-mod A +2    Time  12    Period  Weeks    Status  Partially Met    Target  Date  05/23/19      PT LONG TERM GOAL #5   Title  Patient will ambulate at least 10 feet with min A using LRAD to improve mobility for household and community distances.    Baseline  unable to take steps (06/12/2018); able to shift R leg forward and back with max A and RW at edge of plinth (06/19/2018); 07/08/18: unable to ambulate at this time. 09/11/18: unable to perform at this time; 10/23/2018: unable to perform at this time; 10/12: unable to perform at this time, 10/29: unable at this time; 01/13/19: unable at this time, 2/11: min A to step with RLE, unable to step with LLE    Time  12    Period  Weeks    Status  Revised    Target Date  05/23/19      PT LONG TERM GOAL #6   Title  Patient will be able to safety navigate up/down ramp with power chair, exhibiting good safety awareness, mod I to safely enter/exit his home.    Baseline  2/11: supervision with intermittent min A    Time  12    Period  Weeks    Status  On-going    Target Date  05/23/19      PT LONG TERM GOAL #7   Title  Patient will increase functional reach test to >15 inches in sitting to exhibit improved sitting balance and positioning and reduce fall risk;    Baseline  07/30/18: 12 inches; 09/11/18: 10-12 inches; 10/23/18: 12-13 inch; 10/12: 14.5", 10/29: 15.5 inch    Time  4    Period  Weeks    Status  Achieved  Target Date  12/04/18      PT LONG TERM GOAL #8   Title  Patient will increase LLE quad strength to 3+/5 to improve functional strength for standing and mobility;    Baseline  10/28: 2+/5; 01/13/19: 2+/5, 2/11: 3-/5    Time  12    Period  Weeks    Status  Partially Met    Target Date  05/23/19            Plan - 03/25/19 1244    Clinical Impression Statement  Patient motivated and participated well within session. Instructed patient in advanced transfers using RW from elevated mat surface. He continues to require cues for forward weight. Patient had increased difficulty finding midline this session. Patient  fatigues quickly with increased repetition. Instructed patient in advanced LE strengthening with increased repetition. patient would benefit from additional skilled PT intervention to improve strength, balance and mobility;    Comorbidities  Relevant past medical history and comorbidities include long term steroid use for lupus, CKD, chronic osteomyelitis, cervical spine stenosis, BPH, APS, alcohol abuse, peripheral artery disease, depression, GERD, obstructive sleep apnea, HTN, IBS, IgA deficiency, lumbar radiculopathy, RA, Stroke, toxic maculopathy in both eyes, systemic lupus erythematosus related syndrome, cardiac catheterization, carotid endarterectomy, cervical laminectomy, coronary angioplasty, Fusion C5-C7, knee arthroplasty, lumbar surgery.    Rehab Potential  Fair    PT Frequency  3x / week    PT Duration  12 weeks    PT Treatment/Interventions  ADLs/Self Care Home Management;Electrical Stimulation;Moist Heat;Cryotherapy;Gait training;Stair training;Functional mobility training;Therapeutic exercise;Balance training;Neuromuscular re-education;Cognitive remediation;Patient/family education;Orthotic Fit/Training;Wheelchair mobility training;Manual techniques;Passive range of motion;Energy conservation;Joint Manipulations    PT Next Visit Plan  Work on strength, sitting/standing balance, functional activities such as rolling, bed mobility, transfers; caregiver training    PT Home Exercise Plan  no updates this session    Consulted and Agree with Plan of Care  Patient;Family member/caregiver    Family Member Consulted  daughter [name]       Patient will benefit from skilled therapeutic intervention in order to improve the following deficits and impairments:  Abnormal gait, Decreased activity tolerance, Decreased cognition, Decreased endurance, Decreased knowledge of use of DME, Decreased range of motion, Decreased skin integrity, Decreased strength, Impaired perceived functional ability, Impaired  sensation, Impaired UE functional use, Improper body mechanics, Pain, Cardiopulmonary status limiting activity, Decreased balance, Decreased coordination, Decreased mobility, Difficulty walking, Impaired tone, Postural dysfunction  Visit Diagnosis: Muscle weakness (generalized)  Other lack of coordination  Difficulty in walking, not elsewhere classified  Repeated falls     Problem List Patient Active Problem List   Diagnosis Date Noted  . Sepsis ([place]) 10/18/2018    , PT, DPT 03/25/2019, 12:47 PM  [place] MAIN [facility] SERVICES [address] [place], [place], [address] Phone: [phone]   Fax:  [phone]  Name: Kenneth Summers MRN: [id number] Date of Birth: [birth year]

## 2019-03-26 ENCOUNTER — Encounter: Payer: Self-pay | Admitting: Physical Therapy

## 2019-03-26 ENCOUNTER — Ambulatory Visit: Payer: Medicare Other | Admitting: Physical Therapy

## 2019-03-26 ENCOUNTER — Other Ambulatory Visit: Payer: Self-pay

## 2019-03-26 DIAGNOSIS — M6281 Muscle weakness (generalized): Secondary | ICD-10-CM

## 2019-03-26 DIAGNOSIS — R262 Difficulty in walking, not elsewhere classified: Secondary | ICD-10-CM

## 2019-03-26 DIAGNOSIS — R278 Other lack of coordination: Secondary | ICD-10-CM

## 2019-03-26 DIAGNOSIS — R296 Repeated falls: Secondary | ICD-10-CM

## 2019-03-26 NOTE — Therapy (Signed)
[place] MAIN [facility] SERVICES [address] [place], [place], [address] Phone: [phone]   Fax:  [phone]  Physical Therapy Treatment  Patient Details  Name: Kenneth Summers MRN: [id number] Date of Birth: [birth year] Referring Provider (PT): Dr. [name], Guy Begin   Encounter Date: 03/26/2019  PT End of Session - 03/26/19 1453    Visit Number  [id number]    Number of Visits  144    Date for PT Re-Evaluation  05/23/19    Authorization Type  Medicare reporting period starting 09/11/18    Authorization Time Period  goals last assessed 03/19/19    PT Start Time  1302    PT Stop Time  1345    PT Time Calculation (min)  43 min    Equipment Utilized During Treatment  Gait belt    Activity Tolerance  Patient tolerated treatment well;No increased pain    Behavior During Therapy  WFL for tasks assessed/performed       Past Medical History:  Diagnosis Date  . Alcohol abuse   . APS (antiphospholipid syndrome) ([place])   . BPH (benign prostatic hyperplasia)   . CAD (coronary artery disease)   . Cellulitis   . Cervical spinal stenosis   . Chronic osteomyelitis ([place])   . CKD (chronic kidney disease)   . CKD (chronic kidney disease)   . Clostridium difficile diarrhea   . Depression   . Diplopia   . Fatigue   . GERD (gastroesophageal reflux disease)   . Hyperlipemia   . Hypertension   . Hypovitaminosis D   . IBS (irritable bowel syndrome)   . 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 Insomnia   . Left lumbosacral radiculopathy   . Moderate obstructive sleep apnea   . Osteomyelitis of foot ([place])   . PAD (peripheral artery disease) ([place])   . Pruritus   . RA (rheumatoid arthritis) ([place])   . Radiculopathy   . Restless legs syndrome   . RLS (restless legs syndrome)   . SLE (systemic lupus erythematosus related syndrome) ([place])   . Stroke ([place])   . Toxic maculopathy of both eyes     Past Surgical History:  Procedure Laterality Date  . CARDIAC  CATHETERIZATION    . CAROTID ENDARTERECTOMY    . CERVICAL LAMINECTOMY    . CORONARY ANGIOPLASTY    . FRACTURE SURGERY    . fusion C5-6-7    . HERNIA REPAIR    . KNEE ARTHROSCOPY    . TONSILLECTOMY      There were no vitals filed for this visit.  Subjective Assessment - 03/26/19 1452    Subjective  Patient reports doing well; no pain; no new falls;    Patient is accompained by:  Family member    Pertinent History  Patient is a 74 y.o. male who presents to outpatient physical therapy with a referral for medical diagnosis of CVA. This patient's chief complaints consist of left hemiplegia and overall deconditioning leading to the following functional deficits: dependent for ADLs, IADLs, unable to transfer to car, dependent transfers at home with hoyer lift, unable to walk or stand without significant assistance, difficulty with W/C navigation..    Currently in Pain?  No/denies    Multiple Pain Sites  No            TREATMENT:  Patient instructed in sliding board transfers to improve ability to transfer wheelchair to tub bench.  Pt seated with wheelchair to left of mat table Educated patient  in proper sliding board position, he required max A for board placement; Patient transferred sliding board from wheelchair to mat table, mod A +1 with mod VCs for proper positioning including hand placement, trunk position and feet position. He had increased difficulty with proper hand placement and often would fall backwards against back of chair due to fatigue;  Patient then transferred mat table to wheelchair with min-mod A +1 with cues for proper hand placement, trunk lean/position and LE placement.  Patient performed 2 sliding board transfers requiring increased time for proper positioning and technique;  He exhibits increased fatigue at end of session requiring mod A +2 for repositioning in chair. Patient educated on importance of sitting forward in power chair while at home to challenge  trunk control and positioning;                    PT Education - 03/26/19 1452    Education Details  transfers/positioning;    Person(s) Educated  Patient;Child(ren)    Methods  Explanation;Verbal cues    Comprehension  Verbalized understanding;Returned demonstration;Verbal cues required;Need further instruction       PT Short Term Goals - 03/20/19 0846      PT SHORT TERM GOAL #1   Title  Be independent with initial home exercise program for self-management of symptoms.    Baseline  HEP to be given at second session; advice to practice unsupported sitting at home (06/19/2018); 07/08/18: Performing at home, 6/29: needs assistance but is doing exercise program regularly; 08/22/18: adherent;    Time  2    Period  Weeks    Status  Achieved    Target Date  06/26/18        PT Long Term Goals - 03/20/19 0847      PT LONG TERM GOAL #1   Title  Patient will complete all bed mobility with min A to improve functional independence for getting in and out of bed and adjusting in bed.     Baseline  max A - total A reported by family (06/19/2018); 07/08/18: still requires maxA, dtr reports improvement in rolling since starting therapy; 6/23: min A to supervision in clinic; not doing at home right; 8/5: Pt's daughter states that his rolling has improved, but still has difficulty with all other bed mobility; 10/23/18: pts daughter states that she is doing about 35% of the work if he is in proper position, he does uses the bed rails to help roll;  using Hoyer for coming to sit EOB; 10/12: pts daughter states that she is doing about 30% of the work if he is in proper position, he does uses the bed rails to help roll, but it is improving;  using Hoyer for coming to sit EOB, 10/29: min A for sit to sidelying, rolling supervision, able to transition sidelying to sit with HHA;    Time  12    Period  Weeks    Status  Achieved    Target Date  12/04/18      PT LONG TERM GOAL #2   Title  Patient will  complete sit <> stand transfer chair to chair with Min A and LRAD to improve functional independence for household and community mobility.     Baseline  variable min-modA for STS, still requires elevated surface height to 27"    Time  12    Period  Weeks    Status  Partially Met    Target Date  05/23/19  PT LONG TERM GOAL #3   Title  Patient will navigate power w/c with min A x 100 feet to improve mobility for household and short community distances.    Baseline  required total A (06/12/2018); able to roll 20 feet with min A (06/20/2018); 07/08/18: Pt can go approximately 10' without assist per family;  10/23/2018: pt's daughter states that his power WC mobility has become 100% better with the new hand control, slowed speed, and with practice; 10/12: pt's daughter reports that he can perform household WC mobility and limited community ambulation with minA    Time  12    Period  Weeks    Status  Achieved    Target Date  05/23/19      PT LONG TERM GOAL #4   Title  Patient will complete car transfers W/C to family SUV with min A using LRAD to improve his abilty to participate in community activities.     Baseline  unable to complete car transfers at all (06/12/2018); unable to complete car transfers at all (06/19/2018).; 07/08/18: Unable to perform at this time; 09/11/18: unable to perform at this time; 10/23/2018: unable to complete car transfers at all; 10/12:  unable to complete car transfers at all, 10/29: unable; 01/13/19: unable; 2/11: min-mod A +2    Time  12    Period  Weeks    Status  Partially Met    Target Date  05/23/19      PT LONG TERM GOAL #5   Title  Patient will ambulate at least 10 feet with min A using LRAD to improve mobility for household and community distances.    Baseline  unable to take steps (06/12/2018); able to shift R leg forward and back with max A and RW at edge of plinth (06/19/2018); 07/08/18: unable to ambulate at this time. 09/11/18: unable to perform at this time; 10/23/2018:  unable to perform at this time; 10/12: unable to perform at this time, 10/29: unable at this time; 01/13/19: unable at this time, 2/11: min A to step with RLE, unable to step with LLE    Time  12    Period  Weeks    Status  Revised    Target Date  05/23/19      PT LONG TERM GOAL #6   Title  Patient will be able to safety navigate up/down ramp with power chair, exhibiting good safety awareness, mod I to safely enter/exit his home.    Baseline  2/11: supervision with intermittent min A    Time  12    Period  Weeks    Status  On-going    Target Date  05/23/19      PT LONG TERM GOAL #7   Title  Patient will increase functional reach test to >15 inches in sitting to exhibit improved sitting balance and positioning and reduce fall risk;    Baseline  07/30/18: 12 inches; 09/11/18: 10-12 inches; 10/23/18: 12-13 inch; 10/12: 14.5", 10/29: 15.5 inch    Time  4    Period  Weeks    Status  Achieved    Target Date  12/04/18      PT LONG TERM GOAL #8   Title  Patient will increase LLE quad strength to 3+/5 to improve functional strength for standing and mobility;    Baseline  10/28: 2+/5; 01/13/19: 2+/5, 2/11: 3-/5    Time  12    Period  Weeks    Status  Partially Met  Target Date  05/23/19            Plan - 03/26/19 1453    Clinical Impression Statement  Patient motivated and participated well within session. Instructed patient and caregiver in sliding board transfers with goal of improving transfers for showers. Patient does require mod VCs for proper positioning and technique. He also required min-mod A for sliding board transfer. He fatigues quickly with prolonged unsupported standing, exhibiting decreased trunk control. Educated patient in importance of sitting forward in chair at home to challenge back extensor strength and to improve trunk stability. He would benefit from additional skilled PT intervention to improve strength, balance and mobility;    Comorbidities  Relevant past medical  history and comorbidities include long term steroid use for lupus, CKD, chronic osteomyelitis, cervical spine stenosis, BPH, APS, alcohol abuse, peripheral artery disease, depression, GERD, obstructive sleep apnea, HTN, IBS, IgA deficiency, lumbar radiculopathy, RA, Stroke, toxic maculopathy in both eyes, systemic lupus erythematosus related syndrome, cardiac catheterization, carotid endarterectomy, cervical laminectomy, coronary angioplasty, Fusion C5-C7, knee arthroplasty, lumbar surgery.    Rehab Potential  Fair    PT Frequency  3x / week    PT Duration  12 weeks    PT Treatment/Interventions  ADLs/Self Care Home Management;Electrical Stimulation;Moist Heat;Cryotherapy;Gait training;Stair training;Functional mobility training;Therapeutic exercise;Balance training;Neuromuscular re-education;Cognitive remediation;Patient/family education;Orthotic Fit/Training;Wheelchair mobility training;Manual techniques;Passive range of motion;Energy conservation;Joint Manipulations    PT Next Visit Plan  Work on strength, sitting/standing balance, functional activities such as rolling, bed mobility, transfers; caregiver training    PT Home Exercise Plan  no updates this session    Consulted and Agree with Plan of Care  Patient;Family member/caregiver    Family Member Consulted  daughter [name]       Patient will benefit from skilled therapeutic intervention in order to improve the following deficits and impairments:  Abnormal gait, Decreased activity tolerance, Decreased cognition, Decreased endurance, Decreased knowledge of use of DME, Decreased range of motion, Decreased skin integrity, Decreased strength, Impaired perceived functional ability, Impaired sensation, Impaired UE functional use, Improper body mechanics, Pain, Cardiopulmonary status limiting activity, Decreased balance, Decreased coordination, Decreased mobility, Difficulty walking, Impaired tone, Postural dysfunction  Visit Diagnosis: Muscle weakness  (generalized)  Other lack of coordination  Difficulty in walking, not elsewhere classified  Repeated falls     Problem List Patient Active Problem List   Diagnosis Date Noted  . Sepsis ([place]) 10/18/2018    Swara Donze PT, DPT 03/26/2019, 2:55 PM  [place] MAIN [facility] SERVICES [address] [place], [place], [address] Phone: [phone]   Fax:  [phone]  Name: FODAY CONE MRN: [id number] Date of Birth: 1945/07/06

## 2019-03-27 ENCOUNTER — Ambulatory Visit: Payer: Medicare Other | Admitting: Occupational Therapy

## 2019-03-27 ENCOUNTER — Ambulatory Visit: Payer: Medicare Other | Admitting: Physical Therapy

## 2019-03-29 ENCOUNTER — Ambulatory Visit: Payer: Medicare Other

## 2019-03-31 ENCOUNTER — Ambulatory Visit: Payer: Medicare Other | Admitting: Occupational Therapy

## 2019-03-31 ENCOUNTER — Ambulatory Visit: Payer: Medicare Other | Admitting: Physical Therapy

## 2019-04-02 ENCOUNTER — Encounter: Payer: Self-pay | Admitting: Physical Therapy

## 2019-04-02 ENCOUNTER — Ambulatory Visit: Payer: Medicare Other | Admitting: Physical Therapy

## 2019-04-02 ENCOUNTER — Other Ambulatory Visit: Payer: Self-pay

## 2019-04-02 DIAGNOSIS — R278 Other lack of coordination: Secondary | ICD-10-CM

## 2019-04-02 DIAGNOSIS — M6281 Muscle weakness (generalized): Secondary | ICD-10-CM | POA: Diagnosis not present

## 2019-04-02 DIAGNOSIS — R296 Repeated falls: Secondary | ICD-10-CM

## 2019-04-02 DIAGNOSIS — R262 Difficulty in walking, not elsewhere classified: Secondary | ICD-10-CM

## 2019-04-02 NOTE — Therapy (Signed)
[place] MAIN [facility] SERVICES [address] [place], [place], [address] Phone: [phone]   Fax:  [phone]  Physical Therapy Treatment  Patient Details  Name: Kenneth Summers MRN: [id number] Date of Birth: [birth year] Referring Provider (PT): Dr. [name], Guy Begin   Encounter Date: 04/02/2019  PT End of Session - 04/02/19 1242    Visit Number  [id number]    Number of Visits  144    Date for PT Re-Evaluation  05/23/19    Authorization Type  Medicare reporting period starting 09/11/18    Authorization Time Period  goals last assessed 03/19/19    PT Start Time  1302    PT Stop Time  1345    PT Time Calculation (min)  43 min    Equipment Utilized During Treatment  Gait belt    Activity Tolerance  Patient tolerated treatment well;No increased pain    Behavior During Therapy  WFL for tasks assessed/performed       Past Medical History:  Diagnosis Date  . Alcohol abuse   . APS (antiphospholipid syndrome) ([place])   . BPH (benign prostatic hyperplasia)   . CAD (coronary artery disease)   . Cellulitis   . Cervical spinal stenosis   . Chronic osteomyelitis ([place])   . CKD (chronic kidney disease)   . CKD (chronic kidney disease)   . Clostridium difficile diarrhea   . Depression   . Diplopia   . Fatigue   . GERD (gastroesophageal reflux disease)   . Hyperlipemia   . Hypertension   . Hypovitaminosis D   . IBS (irritable bowel syndrome)   . 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 Insomnia   . Left lumbosacral radiculopathy   . Moderate obstructive sleep apnea   . Osteomyelitis of foot ([place])   . PAD (peripheral artery disease) ([place])   . Pruritus   . RA (rheumatoid arthritis) ([place])   . Radiculopathy   . Restless legs syndrome   . RLS (restless legs syndrome)   . SLE (systemic lupus erythematosus related syndrome) ([place])   . Stroke ([place])   . Toxic maculopathy of both eyes     Past Surgical History:  Procedure Laterality Date  . CARDIAC  CATHETERIZATION    . CAROTID ENDARTERECTOMY    . CERVICAL LAMINECTOMY    . CORONARY ANGIOPLASTY    . FRACTURE SURGERY    . fusion C5-6-7    . HERNIA REPAIR    . KNEE ARTHROSCOPY    . TONSILLECTOMY      There were no vitals filed for this visit.  Subjective Assessment - 04/02/19 1346    Subjective  Patient reports doing well; no pain; no new falls; He reports trying to get up out of the chair over the weekend without success; He also reports trying to sit up in the chair away from backrest with limited success.    Patient is accompained by:  Family member    Pertinent History  Patient is a 74 y.o. male who presents to outpatient physical therapy with a referral for medical diagnosis of CVA. This patient's chief complaints consist of left hemiplegia and overall deconditioning leading to the following functional deficits: dependent for ADLs, IADLs, unable to transfer to car, dependent transfers at home with hoyer lift, unable to walk or stand without significant assistance, difficulty with W/C navigation..    Currently in Pain?  No/denies    Multiple Pain Sites  No         TREATMENT:  Patient instructed in sliding board transfers to improve ability to transfer wheelchair to tub bench.  Pt seated with wheelchair to left of mat table Educated patient in proper sliding board position, he required max A for board placement; Patient transferred sliding board from wheelchair to mat table, mod A +1 with mod VCs for proper positioning including hand placement, trunk position and feet position. He had increased difficulty with proper hand placement and often would fall backwards against back of chair due to fatigue;  Seated on mat table:  Instructed patient in sit<>Stand transfers on mat table:  Adjusted mat table to 27 inch height for optimal transfer ability; Attempted transfers with RW but patient unable after 2 attempts due to heavy posterior lean and poor LE muscle activation;  Pt  transferred sit<>Stand with therapist assist, mod A +1 x1 reps, progressing to min A+1 x2 reps; Pt transferred sit<>Stand from mat table with RW x2 reps, min A +1  He was able to stand for approximately 5-15 sec each repetition. Patient exhibits increased posterior lean with increased difficulty finding midline. He also fatigues quickly; required mod VCs for forward weight shift and to increase push through LE for better transfer ability;   Patient then transferred mat table to wheelchair, sliding board with mod A +1 with cues for proper hand placement, trunk lean/position and LE placement.  He exhibits increased fatigue at end of session requiring maxA +1 for repositioning in chair. Patient educated on importance of sitting forward in power chair while at home to challenge trunk control and positioning;     Patient tolerated session fair.  He sat on mat table, unsupported back for approximately 20 min during sit<>Stand transfers. Patient fatigued quickly reporting increased back/neck discomfort with heavy posterior lean. Patient required increased assistance for sliding board transfer and repositioning in wheelchair. Reinforced importance of HEP adherence including unsupported sitting to improve trunk strength and control;                      PT Education - 04/02/19 1242    Education Details  transfers/positioning    Person(s) Educated  Patient    Methods  Explanation;Verbal cues    Comprehension  Verbalized understanding;Returned demonstration;Verbal cues required;Need further instruction       PT Short Term Goals - 03/20/19 0846      PT SHORT TERM GOAL #1   Title  Be independent with initial home exercise program for self-management of symptoms.    Baseline  HEP to be given at second session; advice to practice unsupported sitting at home (06/19/2018); 07/08/18: Performing at home, 6/29: needs assistance but is doing exercise program regularly; 08/22/18: adherent;     Time  2    Period  Weeks    Status  Achieved    Target Date  06/26/18        PT Long Term Goals - 03/20/19 0847      PT LONG TERM GOAL #1   Title  Patient will complete all bed mobility with min A to improve functional independence for getting in and out of bed and adjusting in bed.     Baseline  max A - total A reported by family (06/19/2018); 07/08/18: still requires maxA, dtr reports improvement in rolling since starting therapy; 6/23: min A to supervision in clinic; not doing at home right; 8/5: Pt's daughter states that his rolling has improved, but still has difficulty with all other bed mobility; 10/23/18: pts daughter states that she  is doing about 35% of the work if he is in proper position, he does uses the bed rails to help roll;  using Harrel Lemon for coming to sit EOB; 10/12: pts daughter states that she is doing about 30% of the work if he is in proper position, he does uses the bed rails to help roll, but it is improving;  using Hoyer for coming to sit EOB, 10/29: min A for sit to sidelying, rolling supervision, able to transition sidelying to sit with HHA;    Time  12    Period  Weeks    Status  Achieved    Target Date  12/04/18      PT LONG TERM GOAL #2   Title  Patient will complete sit <> stand transfer chair to chair with Min A and LRAD to improve functional independence for household and community mobility.     Baseline  variable min-modA for STS, still requires elevated surface height to 27"    Time  12    Period  Weeks    Status  Partially Met    Target Date  05/23/19      PT LONG TERM GOAL #3   Title  Patient will navigate power w/c with min A x 100 feet to improve mobility for household and short community distances.    Baseline  required total A (06/12/2018); able to roll 20 feet with min A (06/20/2018); 07/08/18: Pt can go approximately 10' without assist per family;  10/23/2018: pt's daughter states that his power WC mobility has become 100% better with the new hand control,  slowed speed, and with practice; 10/12: pt's daughter reports that he can perform household WC mobility and limited community ambulation with minA    Time  12    Period  Weeks    Status  Achieved    Target Date  05/23/19      PT LONG TERM GOAL #4   Title  Patient will complete car transfers W/C to family SUV with min A using LRAD to improve his abilty to participate in community activities.     Baseline  unable to complete car transfers at all (06/12/2018); unable to complete car transfers at all (06/19/2018).; 07/08/18: Unable to perform at this time; 09/11/18: unable to perform at this time; 10/23/2018: unable to complete car transfers at all; 10/12:  unable to complete car transfers at all, 10/29: unable; 01/13/19: unable; 2/11: min-mod A +2    Time  12    Period  Weeks    Status  Partially Met    Target Date  05/23/19      PT LONG TERM GOAL #5   Title  Patient will ambulate at least 10 feet with min A using LRAD to improve mobility for household and community distances.    Baseline  unable to take steps (06/12/2018); able to shift R leg forward and back with max A and RW at edge of plinth (06/19/2018); 07/08/18: unable to ambulate at this time. 09/11/18: unable to perform at this time; 10/23/2018: unable to perform at this time; 10/12: unable to perform at this time, 10/29: unable at this time; 01/13/19: unable at this time, 2/11: min A to step with RLE, unable to step with LLE    Time  12    Period  Weeks    Status  Revised    Target Date  05/23/19      PT LONG TERM GOAL #6   Title  Patient will be  able to safety navigate up/down ramp with power chair, exhibiting good safety awareness, mod I to safely enter/exit his home.    Baseline  2/11: supervision with intermittent min A    Time  12    Period  Weeks    Status  On-going    Target Date  05/23/19      PT LONG TERM GOAL #7   Title  Patient will increase functional reach test to >15 inches in sitting to exhibit improved sitting balance and  positioning and reduce fall risk;    Baseline  07/30/18: 12 inches; 09/11/18: 10-12 inches; 10/23/18: 12-13 inch; 10/12: 14.5", 10/29: 15.5 inch    Time  4    Period  Weeks    Status  Achieved    Target Date  12/04/18      PT LONG TERM GOAL #8   Title  Patient will increase LLE quad strength to 3+/5 to improve functional strength for standing and mobility;    Baseline  10/28: 2+/5; 01/13/19: 2+/5, 2/11: 3-/5    Time  12    Period  Weeks    Status  Partially Met    Target Date  05/23/19            Plan - 04/02/19 1347    Clinical Impression Statement  Patient motivated and participated fair within session. He did have significant difficulty with transfers this session requiring increased assistance and cues. This likley could be related to a few missed appointments and time since last session. Patient required mod VCs for trunk positioning and control for better positioning. He exhibits increased posterior lean especially with fatigue. Patient fatigued quickly while sitting on mat table unsupported. Reinforced importance of HEP adherence with sitting forward in chair for better trunk strengthening. Patient would benefit from additional skilled PT intervention to improve strength, balance and mobility;    Comorbidities  Relevant past medical history and comorbidities include long term steroid use for lupus, CKD, chronic osteomyelitis, cervical spine stenosis, BPH, APS, alcohol abuse, peripheral artery disease, depression, GERD, obstructive sleep apnea, HTN, IBS, IgA deficiency, lumbar radiculopathy, RA, Stroke, toxic maculopathy in both eyes, systemic lupus erythematosus related syndrome, cardiac catheterization, carotid endarterectomy, cervical laminectomy, coronary angioplasty, Fusion C5-C7, knee arthroplasty, lumbar surgery.    Rehab Potential  Fair    PT Frequency  3x / week    PT Duration  12 weeks    PT Treatment/Interventions  ADLs/Self Care Home Management;Electrical Stimulation;Moist  Heat;Cryotherapy;Gait training;Stair training;Functional mobility training;Therapeutic exercise;Balance training;Neuromuscular re-education;Cognitive remediation;Patient/family education;Orthotic Fit/Training;Wheelchair mobility training;Manual techniques;Passive range of motion;Energy conservation;Joint Manipulations    PT Next Visit Plan  Work on strength, sitting/standing balance, functional activities such as rolling, bed mobility, transfers; caregiver training    PT Home Exercise Plan  no updates this session    Consulted and Agree with Plan of Care  Patient;Family member/caregiver    Family Member Consulted  daughter [name]       Patient will benefit from skilled therapeutic intervention in order to improve the following deficits and impairments:  Abnormal gait, Decreased activity tolerance, Decreased cognition, Decreased endurance, Decreased knowledge of use of DME, Decreased range of motion, Decreased skin integrity, Decreased strength, Impaired perceived functional ability, Impaired sensation, Impaired UE functional use, Improper body mechanics, Pain, Cardiopulmonary status limiting activity, Decreased balance, Decreased coordination, Decreased mobility, Difficulty walking, Impaired tone, Postural dysfunction  Visit Diagnosis: Muscle weakness (generalized)  Other lack of coordination  Difficulty in walking, not elsewhere classified  Repeated falls  Problem List Patient Active Problem List   Diagnosis Date Noted  . Sepsis ([place]) 10/18/2018    Ceanna Wareing PT, DPT 04/02/2019, 1:49 PM  [place] MAIN [facility] SERVICES [address] [place], [place], [address] Phone: [phone]   Fax:  [phone]  Name: JAHARI BILLY MRN: [id number] Date of Birth: May 11, 1945

## 2019-04-03 ENCOUNTER — Encounter: Payer: Self-pay | Admitting: Occupational Therapy

## 2019-04-03 ENCOUNTER — Ambulatory Visit: Payer: Medicare Other | Admitting: Physical Therapy

## 2019-04-03 ENCOUNTER — Encounter: Payer: Self-pay | Admitting: Physical Therapy

## 2019-04-03 ENCOUNTER — Other Ambulatory Visit: Payer: Self-pay

## 2019-04-03 ENCOUNTER — Ambulatory Visit: Payer: Medicare Other | Admitting: Occupational Therapy

## 2019-04-03 DIAGNOSIS — M6281 Muscle weakness (generalized): Secondary | ICD-10-CM

## 2019-04-03 DIAGNOSIS — R262 Difficulty in walking, not elsewhere classified: Secondary | ICD-10-CM

## 2019-04-03 DIAGNOSIS — R296 Repeated falls: Secondary | ICD-10-CM

## 2019-04-03 DIAGNOSIS — R278 Other lack of coordination: Secondary | ICD-10-CM

## 2019-04-03 NOTE — Therapy (Signed)
[place] MAIN [facility] SERVICES [address] [place], [place], [address] Phone: [phone]   Fax:  [phone]  Physical Therapy Treatment  Patient Details  Name: Kenneth Summers MRN: [id number] Date of Birth: 1945/04/28 Referring Provider (PT): Dr. [name], Guy Begin   Encounter Date: 04/03/2019  PT End of Session - 04/03/19 1301    Visit Number  [id number]    Number of Visits  144    Date for PT Re-Evaluation  05/23/19    Authorization Type  Medicare reporting period starting 09/11/18    Authorization Time Period  goals last assessed 03/19/19    PT Start Time  1345    PT Stop Time  1430    PT Time Calculation (min)  45 min    Equipment Utilized During Treatment  Gait belt    Activity Tolerance  Patient tolerated treatment well;No increased pain    Behavior During Therapy  WFL for tasks assessed/performed       Past Medical History:  Diagnosis Date  . Alcohol abuse   . APS (antiphospholipid syndrome) ([place])   . BPH (benign prostatic hyperplasia)   . CAD (coronary artery disease)   . Cellulitis   . Cervical spinal stenosis   . Chronic osteomyelitis ([place])   . CKD (chronic kidney disease)   . CKD (chronic kidney disease)   . Clostridium difficile diarrhea   . Depression   . Diplopia   . Fatigue   . GERD (gastroesophageal reflux disease)   . Hyperlipemia   . Hypertension   . Hypovitaminosis D   . IBS (irritable bowel syndrome)   . 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 Insomnia   . Left lumbosacral radiculopathy   . Moderate obstructive sleep apnea   . Osteomyelitis of foot ([place])   . PAD (peripheral artery disease) ([place])   . Pruritus   . RA (rheumatoid arthritis) ([place])   . Radiculopathy   . Restless legs syndrome   . RLS (restless legs syndrome)   . SLE (systemic lupus erythematosus related syndrome) ([place])   . Stroke ([place])   . Toxic maculopathy of both eyes     Past Surgical History:  Procedure Laterality Date  . CARDIAC  CATHETERIZATION    . CAROTID ENDARTERECTOMY    . CERVICAL LAMINECTOMY    . CORONARY ANGIOPLASTY    . FRACTURE SURGERY    . fusion C5-6-7    . HERNIA REPAIR    . KNEE ARTHROSCOPY    . TONSILLECTOMY      There were no vitals filed for this visit.  Subjective Assessment - 04/03/19 1519    Subjective  Patient reports feeling well; no new complaints;    Patient is accompained by:  Family member    Pertinent History  Patient is a 74 y.o. male who presents to outpatient physical therapy with a referral for medical diagnosis of CVA. This patient's chief complaints consist of left hemiplegia and overall deconditioning leading to the following functional deficits: dependent for ADLs, IADLs, unable to transfer to car, dependent transfers at home with hoyer lift, unable to walk or stand without significant assistance, difficulty with W/C navigation..    Currently in Pain?  No/denies              TREATMENT:  Instructed patient in car transers: Patient able to position power chair beside car well with good positioning;    Patient instructed in a sit<>stand/stand pivot transfer to back drivers side seat; This allowed therapist  to block LLE knee for better knee extension in standing for improved safety awareness. He required min A +2 with patient holding onto door to pull self up and 2nd person in back of car, helping slide hips backwards for better transfer ability;    Patient required mod A+1 for lifting LE into car. He was able to push through UE and LE and scoot to side for neutral sitting position in back of car.  Patient fatigued. Educated patient on proper sitting position and safety awareness with sitting in car.   Then instructed patient in sliding feet out of car, mod A +1 and then transferring sit<>stand and stand pivot to wheelchair. He required min A +1 for sit<>stand transfers with better positioning and ease, but then required mod A +2 for pivot to wheelchair. He did require mod  VCs for proper hand placement and positioning.    Patient required mod A +2 for scooting back into chair for proper positioning;   Patient completed 2 car transfers requiring mod VCs for proper positioning and set up and increased time for proper positioning.   Following car transfers he required mod A +2 for sliding back in power chair for optimal sitting position;    Response to treatment: Pt reports increased fatigue but denies any pain at end of session. Patient required increased cues for proper hand placement and positioning for optimal safety awareness with transfers. He would benefit from additional skilled intervention for better safety awareness with patient and family to improve success and ease with car transfers;                  PT Education - 04/03/19 1301    Education Details  transfers/positioning;    Person(s) Educated  Patient;Child(ren)    Methods  Explanation;Verbal cues    Comprehension  Verbalized understanding;Returned demonstration;Verbal cues required;Need further instruction       PT Short Term Goals - 03/20/19 0846      PT SHORT TERM GOAL #1   Title  Be independent with initial home exercise program for self-management of symptoms.    Baseline  HEP to be given at second session; advice to practice unsupported sitting at home (06/19/2018); 07/08/18: Performing at home, 6/29: needs assistance but is doing exercise program regularly; 08/22/18: adherent;    Time  2    Period  Weeks    Status  Achieved    Target Date  06/26/18        PT Long Term Goals - 03/20/19 0847      PT LONG TERM GOAL #1   Title  Patient will complete all bed mobility with min A to improve functional independence for getting in and out of bed and adjusting in bed.     Baseline  max A - total A reported by family (06/19/2018); 07/08/18: still requires maxA, dtr reports improvement in rolling since starting therapy; 6/23: min A to supervision in clinic; not doing at home right;  8/5: Pt's daughter states that his rolling has improved, but still has difficulty with all other bed mobility; 10/23/18: pts daughter states that she is doing about 35% of the work if he is in proper position, he does uses the bed rails to help roll;  using Harrel Lemon for coming to sit EOB; 10/12: pts daughter states that she is doing about 30% of the work if he is in proper position, he does uses the bed rails to help roll, but it is improving;  using Engineer, drilling for  coming to sit EOB, 10/29: min A for sit to sidelying, rolling supervision, able to transition sidelying to sit with HHA;    Time  12    Period  Weeks    Status  Achieved    Target Date  12/04/18      PT LONG TERM GOAL #2   Title  Patient will complete sit <> stand transfer chair to chair with Min A and LRAD to improve functional independence for household and community mobility.     Baseline  variable min-modA for STS, still requires elevated surface height to 27"    Time  12    Period  Weeks    Status  Partially Met    Target Date  05/23/19      PT LONG TERM GOAL #3   Title  Patient will navigate power w/c with min A x 100 feet to improve mobility for household and short community distances.    Baseline  required total A (06/12/2018); able to roll 20 feet with min A (06/20/2018); 07/08/18: Pt can go approximately 10' without assist per family;  10/23/2018: pt's daughter states that his power WC mobility has become 100% better with the new hand control, slowed speed, and with practice; 10/12: pt's daughter reports that he can perform household WC mobility and limited community ambulation with minA    Time  12    Period  Weeks    Status  Achieved    Target Date  05/23/19      PT LONG TERM GOAL #4   Title  Patient will complete car transfers W/C to family SUV with min A using LRAD to improve his abilty to participate in community activities.     Baseline  unable to complete car transfers at all (06/12/2018); unable to complete car transfers at all  (06/19/2018).; 07/08/18: Unable to perform at this time; 09/11/18: unable to perform at this time; 10/23/2018: unable to complete car transfers at all; 10/12:  unable to complete car transfers at all, 10/29: unable; 01/13/19: unable; 2/11: min-mod A +2    Time  12    Period  Weeks    Status  Partially Met    Target Date  05/23/19      PT LONG TERM GOAL #5   Title  Patient will ambulate at least 10 feet with min A using LRAD to improve mobility for household and community distances.    Baseline  unable to take steps (06/12/2018); able to shift R leg forward and back with max A and RW at edge of plinth (06/19/2018); 07/08/18: unable to ambulate at this time. 09/11/18: unable to perform at this time; 10/23/2018: unable to perform at this time; 10/12: unable to perform at this time, 10/29: unable at this time; 01/13/19: unable at this time, 2/11: min A to step with RLE, unable to step with LLE    Time  12    Period  Weeks    Status  Revised    Target Date  05/23/19      PT LONG TERM GOAL #6   Title  Patient will be able to safety navigate up/down ramp with power chair, exhibiting good safety awareness, mod I to safely enter/exit his home.    Baseline  2/11: supervision with intermittent min A    Time  12    Period  Weeks    Status  On-going    Target Date  05/23/19      PT LONG TERM GOAL #7  Title  Patient will increase functional reach test to >15 inches in sitting to exhibit improved sitting balance and positioning and reduce fall risk;    Baseline  07/30/18: 12 inches; 09/11/18: 10-12 inches; 10/23/18: 12-13 inch; 10/12: 14.5", 10/29: 15.5 inch    Time  4    Period  Weeks    Status  Achieved    Target Date  12/04/18      PT LONG TERM GOAL #8   Title  Patient will increase LLE quad strength to 3+/5 to improve functional strength for standing and mobility;    Baseline  10/28: 2+/5; 01/13/19: 2+/5, 2/11: 3-/5    Time  12    Period  Weeks    Status  Partially Met    Target Date  05/23/19             Plan - 04/03/19 1520    Clinical Impression Statement  Patient motivated and participated well within session. Instructed patient and caregiver in car transfers. Educated both in proper positioning and technique for safety awareness and technique. Patient able to complete stand pivot transfer to car with min A +2. He does require increased cues for proper hand placement and trunk control for safety. He fatigues with increased repetition. Patient would benefit from additional skilled PT intervention to improve strength, balance and mobility;    Comorbidities  Relevant past medical history and comorbidities include long term steroid use for lupus, CKD, chronic osteomyelitis, cervical spine stenosis, BPH, APS, alcohol abuse, peripheral artery disease, depression, GERD, obstructive sleep apnea, HTN, IBS, IgA deficiency, lumbar radiculopathy, RA, Stroke, toxic maculopathy in both eyes, systemic lupus erythematosus related syndrome, cardiac catheterization, carotid endarterectomy, cervical laminectomy, coronary angioplasty, Fusion C5-C7, knee arthroplasty, lumbar surgery.    Rehab Potential  Fair    PT Frequency  3x / week    PT Duration  12 weeks    PT Treatment/Interventions  ADLs/Self Care Home Management;Electrical Stimulation;Moist Heat;Cryotherapy;Gait training;Stair training;Functional mobility training;Therapeutic exercise;Balance training;Neuromuscular re-education;Cognitive remediation;Patient/family education;Orthotic Fit/Training;Wheelchair mobility training;Manual techniques;Passive range of motion;Energy conservation;Joint Manipulations    PT Next Visit Plan  Work on strength, sitting/standing balance, functional activities such as rolling, bed mobility, transfers; caregiver training    PT Home Exercise Plan  no updates this session    Consulted and Agree with Plan of Care  Patient;Family member/caregiver    Family Member Consulted  daughter [name]       Patient will benefit from  skilled therapeutic intervention in order to improve the following deficits and impairments:  Abnormal gait, Decreased activity tolerance, Decreased cognition, Decreased endurance, Decreased knowledge of use of DME, Decreased range of motion, Decreased skin integrity, Decreased strength, Impaired perceived functional ability, Impaired sensation, Impaired UE functional use, Improper body mechanics, Pain, Cardiopulmonary status limiting activity, Decreased balance, Decreased coordination, Decreased mobility, Difficulty walking, Impaired tone, Postural dysfunction  Visit Diagnosis: Muscle weakness (generalized)  Other lack of coordination  Difficulty in walking, not elsewhere classified  Repeated falls     Problem List Patient Active Problem List   Diagnosis Date Noted  . Sepsis ([place]) 10/18/2018    Nillie Bartolotta PT, DPT 04/03/2019, 3:21 PM  [place] MAIN [facility] SERVICES [address] [place], [place], [address] Phone: [phone]   Fax:  [phone]  Name: Kenneth Summers MRN: [id number] Date of Birth: 1945/04/06

## 2019-04-03 NOTE — Therapy (Signed)
[place] MAIN [facility] SERVICES [address] [place], [place], [address] Phone: [phone]   Fax:  [phone]  Occupational Therapy Treatment  Patient Details  Name: Kenneth Summers MRN: [id number] Date of Birth: 01-03-1946 No data recorded  Encounter Date: 04/03/2019  OT End of Session - 04/03/19 1520    Visit Number  [id number]    Number of Visits  132    Date for OT Re-Evaluation  04/07/19    Authorization Type  Progress report period starting 12/26/2018    OT Start Time  1300    OT Stop Time  1345    OT Time Calculation (min)  45 min    Activity Tolerance  Patient tolerated treatment well;Patient limited by fatigue    Behavior During Therapy  [facility] for tasks assessed/performed       Past Medical History:  Diagnosis Date  . Alcohol abuse   . APS (antiphospholipid syndrome) ([place])   . BPH (benign prostatic hyperplasia)   . CAD (coronary artery disease)   . Cellulitis   . Cervical spinal stenosis   . Chronic osteomyelitis ([place])   . CKD (chronic kidney disease)   . CKD (chronic kidney disease)   . Clostridium difficile diarrhea   . Depression   . Diplopia   . Fatigue   . GERD (gastroesophageal reflux disease)   . Hyperlipemia   . Hypertension   . Hypovitaminosis D   . IBS (irritable bowel syndrome)   . IgA deficiency ([place])   . Insomnia   . Left lumbosacral radiculopathy   . Moderate obstructive sleep apnea   . Osteomyelitis of foot ([place])   . PAD (peripheral artery disease) ([place])   . Pruritus   . RA (rheumatoid arthritis) ([place])   . Radiculopathy   . Restless legs syndrome   . RLS (restless legs syndrome)   . SLE (systemic lupus erythematosus related syndrome) ([place])   . Stroke ([place])   . Toxic maculopathy of both eyes     Past Surgical History:  Procedure Laterality Date  . CARDIAC CATHETERIZATION    . CAROTID ENDARTERECTOMY    . CERVICAL LAMINECTOMY    . CORONARY ANGIOPLASTY    . FRACTURE SURGERY    . fusion C5-6-7    .  HERNIA REPAIR    . KNEE ARTHROSCOPY    . TONSILLECTOMY      There were no vitals filed for this visit.  Subjective Assessment - 04/03/19 1519    Subjective   Pt. reports feeling better than yesterday.    Patient is accompanied by:  Family member    Patient Stated Goals  To regain use of his left UE, and do more for himself.    Currently in Pain?  No/denies      OT TREATMENT    Neuro muscular re-education:  Pt. worked on grasping, and manipulating 1:, 3/4", and 1/2" washers from a magnetic dish using point grasp pattern. Pt. worked on reaching up, stabilizing, and sustaining shoulder elevation while placing the washer over a small precise target on vertical dowels positioned at various angles. Emphasis was placed on leaning forward with trunk flexion to extend the LUE reach.  Therapeutic Exercise:  Pt. Worked on pinch strengthening in the left hand for lateral, and 3pt. pinch using yellow, red, green resistive clips. Pt. worked on  placing the clips at various vertical and horizontal angles.  Emphasis was placed on flexing forward at the trunk for increased reach.Tactile and verbal cues were  required for eliciting the desired movement.  Self care:  Pt. worked on UE dressing tasks using hemi-dressing techniques. Pt.cues forinitiatingposition the sweatshirtwhenpreparing todonn a zip down sweatshirt, as pt. Continuously attempted to donn it backwards. Pt. Requiredfewercues to thread the LUE with the right, assist to bring the jacket around his back, and reposition the jacket to prepare for zipping.  Response to Treatment:  Pt. tolerated the session well, and was able to flex forward further with the trunk when reaching with the LUE. No rest breaks were required today. Pt. Required cues to initiate donning the jacket, as pt. continuously attempted to donn the jacket backwards. Pt. continues to work on improving LUE strength, and [facility] skills  in order to improve functional reaching  for items, strengthen the core, the LUE, and inprovie LUE functioning during ADLs, and IADL tasks.                       OT Education - 04/03/19 1519    Education Details  LUE functioning    Person(s) Educated  Patient    Methods  Explanation;Demonstration    Comprehension  Returned demonstration;Tactile cues required;Verbalized understanding;Need further instruction          OT Long Term Goals - 03/20/19 1311      OT LONG TERM GOAL #1   Title  Pt. will increase left shoulder flexion AROM by 10 degrees to access cabinet/shelf.    Baseline  Pt. continues to improve with LUE ROM, and functional reaching.    Time  12    Period  Weeks    Status  On-going    Target Date  04/07/19      OT LONG TERM GOAL #2   Title  Pt. will donn a shirt with Supervision.    Baseline  MinA donning a zip down jacket    Time  12    Period  Weeks    Status  On-going    Target Date  04/07/19      OT LONG TERM GOAL #4   Title  Pt. will improve left grip strength by 10# to be able to open a jar/container.    Baseline  pt. continues to make progress with left grip strength, continues to work onopening jars, and containers.    Time  12    Period  Weeks    Status  On-going    Target Date  04/07/19      OT LONG TERM GOAL #5   Title  Pt. will improve left hand [facility] skills to be able to assist with buttoning/zipping.    Baseline  Pt. has made excellent progress with bilateral [facility] skills. Pt. continues to work on improving bilateral [facility] skills for buttoning/zipping    Time  12    Period  Weeks    Status  On-going    Target Date  04/07/19      OT LONG TERM GOAL #6   Title  Pt. will demonstrate visual conmpensatory strategies during 100% of the time during ADLs    Baseline  Pt. requires cues at times for left sides awareness.    Time  12    Period  Weeks    Status  On-going    Target Date  04/07/19      OT LONG TERM GOAL #8   Title  Pt. will accurately identify potential safety  hazard using good safety awareness, and judgement 100% for ADLs, and IADLs.    Baseline  Pt. continues to be limited    Time  12    Period  Weeks    Status  On-going    Target Date  04/07/19      OT LONG TERM GOAL  #9   TITLE  Pt. will  navigate his w/c around obstacles with Supervision and 100% accuracy    Baseline  Pt. continues to require consistent cuing for obstacle on the left    Time  12    Status  On-going    Target Date  04/07/19      OT LONG TERM GOAL  #11   TITLE  Pt. will increase BUE strength by 49mm grades in preparation for ADL transfers.    Baseline  Limited BUE strength    Time  12    Period  Weeks    Status  On-going    Target Date  04/07/19            Plan - 04/03/19 1520    Clinical Impression Statement Pt. tolerated the session well, and was able to flex forward further with the trunk when reaching with the LUE. No rest breaks were required today. Pt. Required cues to initiate donning the jacket, as pt. continuously donned it backwards.Pt. continues to work on improving LUE strength, and [facility] skills  in order to improve functional reaching for items, strengthen the core, the LUE, and inprovie LUE functioning during ADLs, and IADL tasks.    OT Occupational Profile and History  Problem Focused Assessment - Including review of records relating to presenting problem    Occupational performance deficits (Please refer to evaluation for details):  ADL's    Body Structure / Function / Physical Skills  UE functional use;Coordination;FMC;Dexterity;Strength;ROM    Cognitive Skills  Attention;Memory;Emotional;Problem Solve;Safety Awareness    Rehab Potential  Good    Clinical Decision Making  Several treatment options, min-mod task modification necessary    Comorbidities Affecting Occupational Performance:  Presence of comorbidities impacting occupational performance    Comorbidities impacting occupational performance description:  Phyical, cognitive, visual,  medical  comorbidities    Modification or Assistance to Complete Evaluation   No modification of tasks or assist necessary to complete eval    OT Frequency  2x / week    OT Duration  12 weeks    OT Treatment/Interventions  Self-care/ADL training;DME and/or AE instruction;Therapeutic exercise;Therapeutic activities;Moist Heat;Cognitive remediation/compensation;Neuromuscular education;Visual/perceptual remediation/compensation;Coping strategies training;Patient/family education;Passive range of motion;Psychosocial skills training;Energy conservation;Functional Mobility Training    Consulted and Agree with Plan of Care  Patient    Family Member Consulted  Daughter [name]       Patient will benefit from skilled therapeutic intervention in order to improve the following deficits and impairments:   Body Structure / Function / Physical Skills: UE functional use, Coordination, FMC, Dexterity, Strength, ROM Cognitive Skills: Attention, Memory, Emotional, Problem Solve, Safety Awareness     Visit Diagnosis: Muscle weakness (generalized)    Problem List Patient Active Problem List   Diagnosis Date Noted  . Sepsis ([place]) 10/18/2018    Harrel Carina, MS, OTR/L 04/03/2019, 3:32 PM  [address] MAIN [facility] SERVICES [address] [place], [place], [address] Phone: [phone]   Fax:  [phone]  Name: BENJIMAN HODGEMAN MRN: [id number] Date of Birth: 02-23-1945

## 2019-04-07 ENCOUNTER — Encounter: Payer: Self-pay | Admitting: Occupational Therapy

## 2019-04-07 ENCOUNTER — Ambulatory Visit: Payer: Medicare Other | Attending: Family Medicine | Admitting: Physical Therapy

## 2019-04-07 ENCOUNTER — Ambulatory Visit: Payer: Medicare Other | Admitting: Occupational Therapy

## 2019-04-07 ENCOUNTER — Other Ambulatory Visit: Payer: Self-pay

## 2019-04-07 ENCOUNTER — Encounter: Payer: Self-pay | Admitting: Physical Therapy

## 2019-04-07 DIAGNOSIS — M6281 Muscle weakness (generalized): Secondary | ICD-10-CM

## 2019-04-07 DIAGNOSIS — R296 Repeated falls: Secondary | ICD-10-CM | POA: Insufficient documentation

## 2019-04-07 DIAGNOSIS — H547 Unspecified visual loss: Secondary | ICD-10-CM | POA: Insufficient documentation

## 2019-04-07 DIAGNOSIS — R262 Difficulty in walking, not elsewhere classified: Secondary | ICD-10-CM

## 2019-04-07 DIAGNOSIS — R278 Other lack of coordination: Secondary | ICD-10-CM

## 2019-04-07 DIAGNOSIS — I69354 Hemiplegia and hemiparesis following cerebral infarction affecting left non-dominant side: Secondary | ICD-10-CM | POA: Diagnosis present

## 2019-04-07 NOTE — Therapy (Addendum)
[place] MAIN [facility] SERVICES [address] [place], [place], [address] Phone: [phone]   Fax:  [phone]  Occupational Therapy Treatment/Recertification Note  Patient Details  Name: Kenneth Summers MRN: [id number] Date of Birth: 12/06/1945 No data recorded  Encounter Date: 04/07/2019  OT End of Session - 04/07/19 2142    Visit Number  93    Number of Visits  133    Date for OT Re-Evaluation 06/30/2019   Authorization Type  Progress report period starting 12/26/2018    OT Start Time  1349    OT Stop Time  1430    OT Time Calculation (min)  41 min    Activity Tolerance  Patient tolerated treatment well;Patient limited by fatigue    Behavior During Therapy  [facility] for tasks assessed/performed       Past Medical History:  Diagnosis Date  . Alcohol abuse   . APS (antiphospholipid syndrome) ([place])   . BPH (benign prostatic hyperplasia)   . CAD (coronary artery disease)   . Cellulitis   . Cervical spinal stenosis   . Chronic osteomyelitis ([place])   . CKD (chronic kidney disease)   . CKD (chronic kidney disease)   . Clostridium difficile diarrhea   . Depression   . Diplopia   . Fatigue   . GERD (gastroesophageal reflux disease)   . Hyperlipemia   . Hypertension   . Hypovitaminosis D   . IBS (irritable bowel syndrome)   . 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 Insomnia   . Left lumbosacral radiculopathy   . Moderate obstructive sleep apnea   . Osteomyelitis of foot ([place])   . PAD (peripheral artery disease) ([place])   . Pruritus   . RA (rheumatoid arthritis) ([place])   . Radiculopathy   . Restless legs syndrome   . RLS (restless legs syndrome)   . SLE (systemic lupus erythematosus related syndrome) ([place])   . Stroke ([place])   . Toxic maculopathy of both eyes     Past Surgical History:  Procedure Laterality Date  . CARDIAC CATHETERIZATION    . CAROTID ENDARTERECTOMY    . CERVICAL LAMINECTOMY    . CORONARY ANGIOPLASTY    . FRACTURE SURGERY    .  fusion C5-6-7    . HERNIA REPAIR    . KNEE ARTHROSCOPY    . TONSILLECTOMY      There were no vitals filed for this visit.  Subjective Assessment - 04/07/19 1353    Subjective   Pt. reports pain in his neck and shoulders    Patient is accompanied by:  Family member    Pertinent History  Pt. is a 74 y.o. 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 Pt. PMHx includes: Multiple Falls, Lupus, DJD, Renal Abscess, CVA. Pt. resides with his wife. Pt.'s wife and daughter assist with ADLs. Pt. has caregivers in  for 2 hours a day, 6 days a week. Pt. received Rehab services in acute care, at SNF for STR, and [facility] services. Pt. is retired from [employer] for [employer] and [employer].    Patient Stated Goals  To regain use of his left UE, and do more for himself.    Currently in Pain?  Yes    Pain Score  8     Pain Location  Shoulder    Pain Orientation  Right;Left    Pain Descriptors / Indicators  Sore    Pain Type  Acute pain  Pain Onset  Today         [facility] OT Assessment - 04/07/19 1356      Coordination   Right 9 Hole Peg Test  49 sec.    Left 9 Hole Peg Test  1 min. & 6 sec.      AROM   Overall AROM Comments  Left shoulder flexion 125, abduction 90, elbow 5-142 (0-142)      Hand Function   Right Hand Grip (lbs)  30    Right Hand Lateral Pinch  12 lbs    Right Hand 3 Point Pinch  9 lbs    Left Hand Grip (lbs)  26    Left Hand Lateral Pinch  7 lbs    Left 3 point pinch  9 lbs       Measurements were obtained,and goals were reviewed with the pt.  Pt. has made progress with left shoulder AROM, and grip strength. Pt. is improving with fastening the zipper on his jacket, and requires fewer cues to initiate donning his jacket. Pt. continues to require min assist to donn his jacket around his back. Pt. continues require cues for left sided awareness, and using his left hand. Pt. continues to present with limited activity tolerance requiring  frequent restbreaks. Pt. presents with increased bilateral shoulder, and neck pain today limiting pt's functional reaching. Pt. presented with decreased pinch strength, and [place] skills. Pt. continues to work on improving BUE ROM, strength, functional reaching, motor control, and [facility] skills in order to work towards improving, and maximizing independence with ADLs, and IADLs.                  OT Education - 04/07/19 2142    Education Details  06/30/2019    Person(s) Educated  Patient    Methods  Explanation;Demonstration    Comprehension  Returned demonstration;Tactile cues required;Verbalized understanding;Need further instruction          OT Long Term Goals - 04/07/19 1415      OT LONG TERM GOAL #1   Title  Pt. will increase left shoulder flexion AROM by 10 degrees to access cabinet/shelf.    Baseline  Shoulder flexion improved to 125. Pt. continues to work on reaching with increasing emphasis on flexing trunk to improve functional reach.    Time  12    Period  Weeks    Status  On-going    Target Date  06/30/19      OT LONG TERM GOAL #2   Title  Pt. will donn a shirt with Supervision.    Baseline  MinA donning a zip down jacket with consistent cues to initiate the correct direction of the jacket.    Time  12    Period  Weeks    Status  On-going    Target Date  06/30/19      OT LONG TERM GOAL #4   Title  Pt. will improve left grip strength by 10# to be able to open a jar/container.    Baseline  Pt. continues to progress with grip strength, and continues to work towards opening containers, and bottles.    Time  12    Period  Weeks    Status  On-going    Target Date  06/30/19      OT LONG TERM GOAL #5   Title  Pt. will improve left hand [facility] skills to be able to assist with buttoning/zipping.    Baseline  Pt. is improving with manipulating the  zipper on his jacket, however continues to require practice. Pt. continues to work on improving bilateral [facility] skills for  buttoning/zipping    Time  12    Period  Weeks    Status  On-going    Target Date  06/30/19      OT LONG TERM GOAL #6   Title  Pt. will demonstrate visual conmpensatory strategies during 100% of the time during ADLs    Baseline  Pt. requires cues at times for left sides awareness.    Time  12    Period  Weeks    Status  On-going    Target Date  06/30/19      OT LONG TERM GOAL #7   Title  Pt. will prepare a simple cold snack from w/c with supervision using cognitive compensatory strategies 100% of the time.    Baseline  Pt. has difficulty with getting the w/c close enough to access the refrigerator      OT LONG TERM GOAL #8   Title  Pt. will accurately identify potential safety hazard using good safety awareness, and judgement 100% for ADLs, and IADLs.    Baseline  Pt. requires cues    Time  12    Period  Weeks    Status  On-going    Target Date  06/30/19      OT LONG TERM GOAL  #9   TITLE  Pt. will  navigate his w/c around obstacles with Supervision and 100% accuracy    Baseline  Pt. continues to require cuing for obstacle on the left, however requires less cues.    Time  12    Period  Weeks    Status  On-going    Target Date  04/30/19      OT LONG TERM GOAL  #11   TITLE  Pt. will increase BUE strength by 40mm grades in preparation for ADL transfers.    Baseline  Pt. continues to present with limited BUE strength    Time  12    Period  Weeks    Status  On-going    Target Date  06/30/19            Plan - 04/07/19 2143    Clinical Impression Statement Pt. has made progress with left shoulder AROM, and grip strength. Pt. is improving with fastening the zipper on his jacket, and requires fewer cues to initiate donning his jacket. Pt. continues to require min assist to donn his jacket around his back. Pt. continues require cues for left sided awareness, and using his left hand. Pt. continues to present with limited activity tolerance requiring frequent restbreaks. Pt.  presents with increased bilateral shoulder, and neck pain today limiting pt's functional reaching. Pt. presented with decreased pinch strength, and [facility] skills today. Pt. continues to work on improving BUE ROM, strength, functional reaching, motor control, and [facility] skills in order to work towards improving, and maximizing independence with ADLs,and IADLs.   OT Occupational Profile and History  Problem Focused Assessment - Including review of records relating to presenting problem    Occupational performance deficits (Please refer to evaluation for details):  ADL's    Body Structure / Function / Physical Skills  UE functional use;Coordination;FMC;Dexterity;Strength;ROM    Cognitive Skills  Attention;Memory;Emotional;Problem Solve;Safety Awareness    Rehab Potential  Good    Clinical Decision Making  Several treatment options, min-mod task modification necessary    Comorbidities Affecting Occupational Performance:  Presence of comorbidities impacting occupational performance  Modification or Assistance to Complete Evaluation   No modification of tasks or assist necessary to complete eval    OT Frequency  2x / week    OT Duration  12 weeks    OT Treatment/Interventions  Self-care/ADL training;DME and/or AE instruction;Therapeutic exercise;Therapeutic activities;Moist Heat;Cognitive remediation/compensation;Neuromuscular education;Visual/perceptual remediation/compensation;Coping strategies training;Patient/family education;Passive range of motion;Psychosocial skills training;Energy conservation;Functional Mobility Training    Consulted and Agree with Plan of Care  Patient    Family Member Consulted  Daughter [name]       Patient will benefit from skilled therapeutic intervention in order to improve the following deficits and impairments:   Body Structure / Function / Physical Skills: UE functional use, Coordination, FMC, Dexterity, Strength, ROM Cognitive Skills: Attention, Memory, Emotional, Problem  Solve, Safety Awareness     Visit Diagnosis: Muscle weakness (generalized)  Other lack of coordination    Problem List Patient Active Problem List   Diagnosis Date Noted  . Sepsis ([place]) 10/18/2018    Harrel Carina, MS, OTR/L 04/07/2019, 10:14 PM  [place] MAIN [facility] SERVICES [address] [place], [place], [address] Phone: [phone]   Fax:  [phone]  Name: ZACHARRY LACKLAND MRN: [id number] Date of Birth: 10-26-1945

## 2019-04-07 NOTE — Therapy (Signed)
[place] MAIN [facility] SERVICES [address] [place], [place], [address] Phone: [phone]   Fax:  [phone]  Physical Therapy Treatment  Patient Details  Name: Kenneth Summers MRN: [id number] Date of Birth: [birth year] Referring Provider (PT): Dr. [name], Guy Begin   Encounter Date: 04/07/2019  PT End of Session - 04/07/19 1252    Visit Number  106    Number of Visits  144    Date for PT Re-Evaluation  05/23/19    Authorization Type  Medicare reporting period starting 09/11/18    Authorization Time Period  goals last assessed 03/19/19    PT Start Time  1302    PT Stop Time  1345    PT Time Calculation (min)  43 min    Equipment Utilized During Treatment  Gait belt    Activity Tolerance  Patient tolerated treatment well;No increased pain    Behavior During Therapy  WFL for tasks assessed/performed       Past Medical History:  Diagnosis Date  . Alcohol abuse   . APS (antiphospholipid syndrome) ([place])   . BPH (benign prostatic hyperplasia)   . CAD (coronary artery disease)   . Cellulitis   . Cervical spinal stenosis   . Chronic osteomyelitis ([place])   . CKD (chronic kidney disease)   . CKD (chronic kidney disease)   . Clostridium difficile diarrhea   . Depression   . Diplopia   . Fatigue   . GERD (gastroesophageal reflux disease)   . Hyperlipemia   . Hypertension   . Hypovitaminosis D   . IBS (irritable bowel syndrome)   . IgA deficiency ([place])   . Insomnia   . Left lumbosacral radiculopathy   . Moderate obstructive sleep apnea   . Osteomyelitis of foot ([place])   . PAD (peripheral artery disease) ([place])   . Pruritus   . RA (rheumatoid arthritis) ([place])   . Radiculopathy   . Restless legs syndrome   . RLS (restless legs syndrome)   . SLE (systemic lupus erythematosus related syndrome) ([place])   . Stroke ([place])   . Toxic maculopathy of both eyes     Past Surgical History:  Procedure Laterality Date  . CARDIAC CATHETERIZATION     . CAROTID ENDARTERECTOMY    . CERVICAL LAMINECTOMY    . CORONARY ANGIOPLASTY    . FRACTURE SURGERY    . fusion C5-6-7    . HERNIA REPAIR    . KNEE ARTHROSCOPY    . TONSILLECTOMY      There were no vitals filed for this visit.  Subjective Assessment - 04/07/19 1254    Subjective  Patient reports doing well; no new complaints; no new falls;    Patient is accompained by:  Family member    Pertinent History  Patient is a 74 y.o. male who presents to outpatient physical therapy with a referral for medical diagnosis of CVA. This patient's chief complaints consist of left hemiplegia and overall deconditioning leading to the following functional deficits: dependent for ADLs, IADLs, unable to transfer to car, dependent transfers at home with hoyer lift, unable to walk or stand without significant assistance, difficulty with W/C navigation..    Currently in Pain?  No/denies    Multiple Pain Sites  No              TREATMENT:  Patient instructed in sliding board transfers to improve ability to transfer wheelchair to tub bench.  Pt seated with wheelchair to left of mat  table Educated patient in proper sliding board position, he required max A for board placement; Patient transferred sliding board from wheelchair to mat table, mod A +1 with mod VCs for proper positioning including hand placement, trunk position and feet position. He had increased difficulty with proper hand placement and often would fall backwards against back of chair due to fatigue;  Following transfer,  Instructed patient in postural strengthening exercise to facilitate better back strengthening and improve unsupported seated: Sitting: Erect posture with deep breathing x5 reps for anterior pelvic rock; Scapular retraction x10 reps with min VCs to increase scapular activation on LUE for better bilateral back strengthening;  Posterior shoulder rolls x10 reps with min VCs and visual cues to increase LUE  activation; Alternate UE lift to shoulder height x5 reps bilaterally with min VCs to avoid posterior lean and increase anterior pelvic rock for better back control and trunk positioning;  Required min a for balance with advanced seated exercise. Patient does fatigue reporting increased neck/back discomfort;   Following exercise:   Seated on mat table:  Instructed patient in sit<>Stand transfers on mat table:  Adjusted mat table to 27 inch height for optimal transfer ability; Pt transferred sit<>Stand from mat table with RW x2reps, min A +1  He was able to stand for approximately30 sec each repetition. Patient requires cues for forward weight shift. He was able to exhibit better neutral positioning with less posterior lean this session; He does fatigue quickly with standing position.   Patient then transferred mat table to wheelchair, sliding board with mod A +1 with cues for proper hand placement, trunk lean/position and LE placement.  He exhibits increased fatigue at end of session requiring maxA +1 for repositioning in chair. Patient educated on importance of sitting forward in power chair while at home to challenge trunk control and positioning;    Patient tolerated session fair.  He sat on mat table, unsupported back for approximately 20 min during exercise/sit<>Stand transfers. Patient fatigued quickly reporting increased back/neck discomfort with heavy posterior lean. Patient required increased assistance for sliding board transfer and repositioning in wheelchair. Reinforced importance of HEP adherence including unsupported sitting to improve trunk strength and control;                       PT Education - 04/07/19 1251    Education Details  transfers/positioning;    Person(s) Educated  Patient;Child(ren)    Methods  Explanation;Verbal cues    Comprehension  Verbalized understanding;Returned demonstration;Verbal cues required;Need further instruction        PT Short Term Goals - 03/20/19 0846      PT SHORT TERM GOAL #1   Title  Be independent with initial home exercise program for self-management of symptoms.    Baseline  HEP to be given at second session; advice to practice unsupported sitting at home (06/19/2018); 07/08/18: Performing at home, 6/29: needs assistance but is doing exercise program regularly; 08/22/18: adherent;    Time  2    Period  Weeks    Status  Achieved    Target Date  06/26/18        PT Long Term Goals - 03/20/19 0847      PT LONG TERM GOAL #1   Title  Patient will complete all bed mobility with min A to improve functional independence for getting in and out of bed and adjusting in bed.     Baseline  max A - total A reported by family (06/19/2018);  07/08/18: still requires maxA, dtr reports improvement in rolling since starting therapy; 6/23: min A to supervision in clinic; not doing at home right; 8/5: Pt's daughter states that his rolling has improved, but still has difficulty with all other bed mobility; 10/23/18: pts daughter states that she is doing about 35% of the work if he is in proper position, he does uses the bed rails to help roll;  using Harrel Lemon for coming to sit EOB; 10/12: pts daughter states that she is doing about 30% of the work if he is in proper position, he does uses the bed rails to help roll, but it is improving;  using Hoyer for coming to sit EOB, 10/29: min A for sit to sidelying, rolling supervision, able to transition sidelying to sit with HHA;    Time  12    Period  Weeks    Status  Achieved    Target Date  12/04/18      PT LONG TERM GOAL #2   Title  Patient will complete sit <> stand transfer chair to chair with Min A and LRAD to improve functional independence for household and community mobility.     Baseline  variable min-modA for STS, still requires elevated surface height to 27"    Time  12    Period  Weeks    Status  Partially Met    Target Date  05/23/19      PT LONG TERM GOAL #3    Title  Patient will navigate power w/c with min A x 100 feet to improve mobility for household and short community distances.    Baseline  required total A (06/12/2018); able to roll 20 feet with min A (06/20/2018); 07/08/18: Pt can go approximately 10' without assist per family;  10/23/2018: pt's daughter states that his power WC mobility has become 100% better with the new hand control, slowed speed, and with practice; 10/12: pt's daughter reports that he can perform household WC mobility and limited community ambulation with minA    Time  12    Period  Weeks    Status  Achieved    Target Date  05/23/19      PT LONG TERM GOAL #4   Title  Patient will complete car transfers W/C to family SUV with min A using LRAD to improve his abilty to participate in community activities.     Baseline  unable to complete car transfers at all (06/12/2018); unable to complete car transfers at all (06/19/2018).; 07/08/18: Unable to perform at this time; 09/11/18: unable to perform at this time; 10/23/2018: unable to complete car transfers at all; 10/12:  unable to complete car transfers at all, 10/29: unable; 01/13/19: unable; 2/11: min-mod A +2    Time  12    Period  Weeks    Status  Partially Met    Target Date  05/23/19      PT LONG TERM GOAL #5   Title  Patient will ambulate at least 10 feet with min A using LRAD to improve mobility for household and community distances.    Baseline  unable to take steps (06/12/2018); able to shift R leg forward and back with max A and RW at edge of plinth (06/19/2018); 07/08/18: unable to ambulate at this time. 09/11/18: unable to perform at this time; 10/23/2018: unable to perform at this time; 10/12: unable to perform at this time, 10/29: unable at this time; 01/13/19: unable at this time, 2/11: min A to step with  RLE, unable to step with LLE    Time  12    Period  Weeks    Status  Revised    Target Date  05/23/19      PT LONG TERM GOAL #6   Title  Patient will be able to safety navigate  up/down ramp with power chair, exhibiting good safety awareness, mod I to safely enter/exit his home.    Baseline  2/11: supervision with intermittent min A    Time  12    Period  Weeks    Status  On-going    Target Date  05/23/19      PT LONG TERM GOAL #7   Title  Patient will increase functional reach test to >15 inches in sitting to exhibit improved sitting balance and positioning and reduce fall risk;    Baseline  07/30/18: 12 inches; 09/11/18: 10-12 inches; 10/23/18: 12-13 inch; 10/12: 14.5", 10/29: 15.5 inch    Time  4    Period  Weeks    Status  Achieved    Target Date  12/04/18      PT LONG TERM GOAL #8   Title  Patient will increase LLE quad strength to 3+/5 to improve functional strength for standing and mobility;    Baseline  10/28: 2+/5; 01/13/19: 2+/5, 2/11: 3-/5    Time  12    Period  Weeks    Status  Partially Met    Target Date  05/23/19            Plan - 04/07/19 1253    Clinical Impression Statement  Patient motivated and participated well within session. instructed patient in advanced transfers. He is progressing with slide board transfers with better awareness of trunk position and mobility. He continues to require increased assistance due to weakness and poor trunk control. he fatigues quickly with prolonged sitting edge of table. 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    Rehab Potential  Fair    PT Frequency  3x / week    PT Duration  12 weeks    PT Treatment/Interventions  ADLs/Self Care Home Management;Electrical Stimulation;Moist Heat;Cryotherapy;Gait training;Stair training;Functional mobility training;Therapeutic exercise;Balance training;Neuromuscular re-education;Cognitive remediation;Patient/family education;Orthotic Fit/Training;Wheelchair mobility training;Manual techniques;Passive range of motion;Energy conservation;Joint Manipulations    PT Next Visit Plan  Work on strength, sitting/standing balance, functional activities such as rolling, bed mobility, transfers; caregiver training    PT Home Exercise Plan  no updates this session    Consulted and Agree with Plan of Care  Patient;Family member/caregiver    Family Member Consulted  daughter [name]       Patient will benefit from skilled therapeutic intervention in order to improve the following deficits and impairments:  Abnormal gait, Decreased activity tolerance, Decreased cognition, Decreased endurance, Decreased knowledge of use of DME, Decreased range of motion, Decreased skin integrity, Decreased strength, Impaired perceived functional ability, Impaired sensation, Impaired UE functional use, Improper body mechanics, Pain, Cardiopulmonary status limiting activity, Decreased balance, Decreased coordination, Decreased  mobility, Difficulty walking, Impaired tone, Postural dysfunction  Visit Diagnosis: Muscle weakness (generalized)  Other lack of coordination  Difficulty in walking, not elsewhere classified  Repeated falls     Problem List Patient Active Problem List   Diagnosis Date Noted  . Sepsis ([place]) 10/18/2018    Makisha Marrin PT, DPT 04/08/2019, 12:42 PM  [place] MAIN [facility] SERVICES [address] [place], [place], [address] Phone: [phone]   Fax:   [phone]  Name: Kenneth Summers MRN: [id number] Date of Birth: October 10, 1945

## 2019-04-09 ENCOUNTER — Ambulatory Visit: Payer: Medicare Other | Admitting: Physical Therapy

## 2019-04-09 ENCOUNTER — Other Ambulatory Visit: Payer: Self-pay

## 2019-04-09 ENCOUNTER — Encounter: Payer: Self-pay | Admitting: Physical Therapy

## 2019-04-09 DIAGNOSIS — M6281 Muscle weakness (generalized): Secondary | ICD-10-CM | POA: Diagnosis not present

## 2019-04-09 DIAGNOSIS — R278 Other lack of coordination: Secondary | ICD-10-CM

## 2019-04-09 DIAGNOSIS — R296 Repeated falls: Secondary | ICD-10-CM

## 2019-04-09 DIAGNOSIS — R262 Difficulty in walking, not elsewhere classified: Secondary | ICD-10-CM

## 2019-04-09 NOTE — Therapy (Signed)
[place] MAIN [facility] SERVICES [address] [place], [place], [address] Phone: [phone]   Fax:  [phone]  Physical Therapy Treatment  Patient Details  Name: Kenneth Summers MRN: [id number] Date of Birth: 1945-08-31 Referring Provider (PT): Dr. [name], Guy Begin   Encounter Date: 04/09/2019  PT End of Session - 04/09/19 1312    Visit Number  [id number]    Number of Visits  144    Date for PT Re-Evaluation  05/23/19    Authorization Type  Medicare reporting period starting 09/11/18    Authorization Time Period  goals last assessed 03/19/19    PT Start Time  1302    PT Stop Time  1345    PT Time Calculation (min)  43 min    Equipment Utilized During Treatment  Gait belt    Activity Tolerance  Patient tolerated treatment well;No increased pain    Behavior During Therapy  WFL for tasks assessed/performed       Past Medical History:  Diagnosis Date  . Alcohol abuse   . APS (antiphospholipid syndrome) ([place])   . BPH (benign prostatic hyperplasia)   . CAD (coronary artery disease)   . Cellulitis   . Cervical spinal stenosis   . Chronic osteomyelitis ([place])   . CKD (chronic kidney disease)   . CKD (chronic kidney disease)   . Clostridium difficile diarrhea   . Depression   . Diplopia   . Fatigue   . GERD (gastroesophageal reflux disease)   . Hyperlipemia   . Hypertension   . Hypovitaminosis D   . IBS (irritable bowel syndrome)   . 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 Insomnia   . Left lumbosacral radiculopathy   . Moderate obstructive sleep apnea   . Osteomyelitis of foot ([place])   . PAD (peripheral artery disease) ([place])   . Pruritus   . RA (rheumatoid arthritis) ([place])   . Radiculopathy   . Restless legs syndrome   . RLS (restless legs syndrome)   . SLE (systemic lupus erythematosus related syndrome) ([place])   . Stroke ([place])   . Toxic maculopathy of both eyes     Past Surgical History:  Procedure Laterality Date  . CARDIAC CATHETERIZATION     . CAROTID ENDARTERECTOMY    . CERVICAL LAMINECTOMY    . CORONARY ANGIOPLASTY    . FRACTURE SURGERY    . fusion C5-6-7    . HERNIA REPAIR    . KNEE ARTHROSCOPY    . TONSILLECTOMY      There were no vitals filed for this visit.  Subjective Assessment - 04/09/19 1311    Subjective  Patient reports doing okay; He reports having a harder time with exercise/transfers today. He reports some difficulty with relying on family but states that he is still pushing through;    Patient is accompained by:  Family member    Pertinent History  Patient is a 74 y.o. male who presents to outpatient physical therapy with a referral for medical diagnosis of CVA. This patient's chief complaints consist of left hemiplegia and overall deconditioning leading to the following functional deficits: dependent for ADLs, IADLs, unable to transfer to car, dependent transfers at home with hoyer lift, unable to walk or stand without significant assistance, difficulty with W/C navigation..    Currently in Pain?  No/denies    Multiple Pain Sites  No          TREATMENT: Patient transferredstandpivot wheelchair<>nustep with BUE and therapist assistance,minA +1;Patient does  require increased time for proper positioning and set up;He requires cues for walking LLE foot around and required min A for placing LLE foot onto foot plate;  Instructed patient in Nustep, seat #15, arms #14 Pt instructed in BUE/BLE level2x58mnwith cues to increase speedto around 50-60steps per minute to challenge cardiovascular endurance with cues to improve positional awareness for neutral position.  Distance propelled: 0.17 miles,then continued for 1 additional minute, LE only level 2 at 50 steps per minute;  HR 84 bpm at end of session;    Following therapeutic exercise: Transferred nustep to wheelchair squat pivotwith min A +120f safety;Patient required cues for hand placement and positioningincluding to increase forward  weight shift. He was able to fully stand and exhibit improved control with pivot this session;   Instructed patient in sit<>Stand transfers from power chair, chair adjusted to 27 inch height and utilized walker for better safety;  Patient required mod VCs for proper positioning including forward weight shift, hand placement and improved foot position Attempted sit<>Stand transfer x5 reps with heavy posterior lean during mid stance being unable to fully stand up;  PT removed walker and instructed patient in sit<>Stand with therapist assist with mod VCs for hand placement on therapist shoulders and mod VCS for forward weight shift and improved trunk control x3 attempts with heavy posterior lean;   Following attempts, instructed patient in LE strengthening:  Seated in chair: -hip flexion march x10 reps bilaterally; -LAQ x10 reps bilaterally  Partial ROM on LLE due to weakness with mod VCs and visual cues for correct exercise technique and positioning for increased muscle activation;  Following exercise, Instructed patient in sit<>Stand transfer with therapist assist x1 rep with patient coming to full standing, min-mod A +1 with heavy posterior lean and cues for forward weight shift for increased weight bearing in BLE; Pt able to hold standing position for approximately 15-20 sec;  Instructed patient in sit<>Stand transfer with RW x1 rep with min-mod A +1 with cues for forward weight shift, especially after fully standing. Patient continues to have heavy posterior lean being unable to hold standing position for >15 sec;   Pt required mod A for repositioning in chair at end of session due to fatigue;   Response to treatment: Tolerated fair. Patient heavily fatigued at end of session. He was easily distracted this session requiring increased quiet environment and increased cues for direction to task. Pt exhibits decreased trunk control this session with heavy posterior/left lateral lean this  session;                      PT Education - 04/09/19 1312    Education Details  transfers/positioning, exercise; HEP    Person(s) Educated  Patient    Methods  Explanation    Comprehension  Verbalized understanding;Verbal cues required;Need further instruction       PT Short Term Goals - 03/20/19 0846      PT SHORT TERM GOAL #1   Title  Be independent with initial home exercise program for self-management of symptoms.    Baseline  HEP to be given at second session; advice to practice unsupported sitting at home (06/19/2018); 07/08/18: Performing at home, 6/29: needs assistance but is doing exercise program regularly; 08/22/18: adherent;    Time  2    Period  Weeks    Status  Achieved    Target Date  06/26/18        PT Long Term Goals - 03/20/19 [id number]  PT LONG TERM GOAL #1   Title  Patient will complete all bed mobility with min A to improve functional independence for getting in and out of bed and adjusting in bed.     Baseline  max A - total A reported by family (06/19/2018); 07/08/18: still requires maxA, dtr reports improvement in rolling since starting therapy; 6/23: min A to supervision in clinic; not doing at home right; 8/5: Pt's daughter states that his rolling has improved, but still has difficulty with all other bed mobility; 10/23/18: pts daughter states that she is doing about 35% of the work if he is in proper position, he does uses the bed rails to help roll;  using Hoyer for coming to sit EOB; 10/12: pts daughter states that she is doing about 30% of the work if he is in proper position, he does uses the bed rails to help roll, but it is improving;  using Hoyer for coming to sit EOB, 10/29: min A for sit to sidelying, rolling supervision, able to transition sidelying to sit with HHA;    Time  12    Period  Weeks    Status  Achieved    Target Date  12/04/18      PT LONG TERM GOAL #2   Title  Patient will complete sit <> stand transfer chair to chair with  Min A and LRAD to improve functional independence for household and community mobility.     Baseline  variable min-modA for STS, still requires elevated surface height to 27"    Time  12    Period  Weeks    Status  Partially Met    Target Date  05/23/19      PT LONG TERM GOAL #3   Title  Patient will navigate power w/c with min A x 100 feet to improve mobility for household and short community distances.    Baseline  required total A (06/12/2018); able to roll 20 feet with min A (06/20/2018); 07/08/18: Pt can go approximately 10' without assist per family;  10/23/2018: pt's daughter states that his power WC mobility has become 100% better with the new hand control, slowed speed, and with practice; 10/12: pt's daughter reports that he can perform household WC mobility and limited community ambulation with minA    Time  12    Period  Weeks    Status  Achieved    Target Date  05/23/19      PT LONG TERM GOAL #4   Title  Patient will complete car transfers W/C to family SUV with min A using LRAD to improve his abilty to participate in community activities.     Baseline  unable to complete car transfers at all (06/12/2018); unable to complete car transfers at all (06/19/2018).; 07/08/18: Unable to perform at this time; 09/11/18: unable to perform at this time; 10/23/2018: unable to complete car transfers at all; 10/12:  unable to complete car transfers at all, 10/29: unable; 01/13/19: unable; 2/11: min-mod A +2    Time  12    Period  Weeks    Status  Partially Met    Target Date  05/23/19      PT LONG TERM GOAL #5   Title  Patient will ambulate at least 10 feet with min A using LRAD to improve mobility for household and community distances.    Baseline  unable to take steps (06/12/2018); able to shift R leg forward and back with max A and RW at  edge of plinth (06/19/2018); 07/08/18: unable to ambulate at this time. 09/11/18: unable to perform at this time; 10/23/2018: unable to perform at this time; 10/12: unable to  perform at this time, 10/29: unable at this time; 01/13/19: unable at this time, 2/11: min A to step with RLE, unable to step with LLE    Time  12    Period  Weeks    Status  Revised    Target Date  05/23/19      PT LONG TERM GOAL #6   Title  Patient will be able to safety navigate up/down ramp with power chair, exhibiting good safety awareness, mod I to safely enter/exit his home.    Baseline  2/11: supervision with intermittent min A    Time  12    Period  Weeks    Status  On-going    Target Date  05/23/19      PT LONG TERM GOAL #7   Title  Patient will increase functional reach test to >15 inches in sitting to exhibit improved sitting balance and positioning and reduce fall risk;    Baseline  07/30/18: 12 inches; 09/11/18: 10-12 inches; 10/23/18: 12-13 inch; 10/12: 14.5", 10/29: 15.5 inch    Time  4    Period  Weeks    Status  Achieved    Target Date  12/04/18      PT LONG TERM GOAL #8   Title  Patient will increase LLE quad strength to 3+/5 to improve functional strength for standing and mobility;    Baseline  10/28: 2+/5; 01/13/19: 2+/5, 2/11: 3-/5    Time  12    Period  Weeks    Status  Partially Met    Target Date  05/23/19            Plan - 04/09/19 1435    Clinical Impression Statement  Patient motivated and participated well within session. He was able to exhibit improved speed and LE muscle activation on nustep this session. He does fatigue quickly and had increased difficulty with sit<>stand transfers. Patient instructed in advanced LE strengthening with visual cues to improve AROM on LLE. Patient required frequent cues for neutral trunk position, exhibiting increased left lateral lean this session. He also required a quiet environment as he was easily distracted. Patient would benefit from additional skilled PT Intervention to improve strength, balance and mobility;    Comorbidities  Relevant past medical history and comorbidities include long term steroid use for lupus,  CKD, chronic osteomyelitis, cervical spine stenosis, BPH, APS, alcohol abuse, peripheral artery disease, depression, GERD, obstructive sleep apnea, HTN, IBS, IgA deficiency, lumbar radiculopathy, RA, Stroke, toxic maculopathy in both eyes, systemic lupus erythematosus related syndrome, cardiac catheterization, carotid endarterectomy, cervical laminectomy, coronary angioplasty, Fusion C5-C7, knee arthroplasty, lumbar surgery.    Rehab Potential  Fair    PT Frequency  3x / week    PT Duration  12 weeks    PT Treatment/Interventions  ADLs/Self Care Home Management;Electrical Stimulation;Moist Heat;Cryotherapy;Gait training;Stair training;Functional mobility training;Therapeutic exercise;Balance training;Neuromuscular re-education;Cognitive remediation;Patient/family education;Orthotic Fit/Training;Wheelchair mobility training;Manual techniques;Passive range of motion;Energy conservation;Joint Manipulations    PT Next Visit Plan  Work on strength, sitting/standing balance, functional activities such as rolling, bed mobility, transfers; caregiver training    PT Home Exercise Plan  no updates this session    Consulted and Agree with Plan of Care  Patient;Family member/caregiver    Family Member Consulted  daughter [name]       Patient will benefit from skilled  therapeutic intervention in order to improve the following deficits and impairments:  Abnormal gait, Decreased activity tolerance, Decreased cognition, Decreased endurance, Decreased knowledge of use of DME, Decreased range of motion, Decreased skin integrity, Decreased strength, Impaired perceived functional ability, Impaired sensation, Impaired UE functional use, Improper body mechanics, Pain, Cardiopulmonary status limiting activity, Decreased balance, Decreased coordination, Decreased mobility, Difficulty walking, Impaired tone, Postural dysfunction  Visit Diagnosis: Muscle weakness (generalized)  Other lack of coordination  Difficulty in  walking, not elsewhere classified  Repeated falls     Problem List Patient Active Problem List   Diagnosis Date Noted  . Sepsis ([place]) 10/18/2018    Alenah Sarria PT, DPT 04/09/2019, 2:42 PM  [place] MAIN [facility] SERVICES [address] [place], [place], [address] Phone: [phone]   Fax:  [phone]  Name: KORY RAINS MRN: [id number] Date of Birth: 1945-06-03

## 2019-04-10 ENCOUNTER — Ambulatory Visit: Payer: Medicare Other | Admitting: Occupational Therapy

## 2019-04-10 ENCOUNTER — Encounter: Payer: Self-pay | Admitting: Occupational Therapy

## 2019-04-10 ENCOUNTER — Ambulatory Visit: Payer: Medicare Other | Admitting: Physical Therapy

## 2019-04-10 ENCOUNTER — Encounter: Payer: Self-pay | Admitting: Physical Therapy

## 2019-04-10 ENCOUNTER — Other Ambulatory Visit: Payer: Self-pay

## 2019-04-10 DIAGNOSIS — M6281 Muscle weakness (generalized): Secondary | ICD-10-CM | POA: Diagnosis not present

## 2019-04-10 DIAGNOSIS — I69354 Hemiplegia and hemiparesis following cerebral infarction affecting left non-dominant side: Secondary | ICD-10-CM

## 2019-04-10 DIAGNOSIS — R296 Repeated falls: Secondary | ICD-10-CM

## 2019-04-10 DIAGNOSIS — R262 Difficulty in walking, not elsewhere classified: Secondary | ICD-10-CM

## 2019-04-10 DIAGNOSIS — R278 Other lack of coordination: Secondary | ICD-10-CM

## 2019-04-10 NOTE — Therapy (Signed)
[place] MAIN [facility] SERVICES [address] [place], [place], [address] Phone: [phone]   Fax:  [phone]  Occupational Therapy Treatment  Patient Details  Name: Kenneth Summers MRN: [id number] Date of Birth: 1945/07/10 No data recorded  Encounter Date: 04/10/2019  OT End of Session - 04/10/19 1725    Visit Number  94    Number of Visits  133    Date for OT Re-Evaluation  06/30/19    Authorization Type  Progress report period starting 12/26/2018    OT Start Time  1313    OT Stop Time  1345    OT Time Calculation (min)  32 min    Activity Tolerance  Patient tolerated treatment well;Patient limited by fatigue    Behavior During Therapy  [facility] for tasks assessed/performed       Past Medical History:  Diagnosis Date  . Alcohol abuse   . APS (antiphospholipid syndrome) ([place])   . BPH (benign prostatic hyperplasia)   . CAD (coronary artery disease)   . Cellulitis   . Cervical spinal stenosis   . Chronic osteomyelitis ([place])   . CKD (chronic kidney disease)   . CKD (chronic kidney disease)   . Clostridium difficile diarrhea   . Depression   . Diplopia   . Fatigue   . GERD (gastroesophageal reflux disease)   . Hyperlipemia   . Hypertension   . Hypovitaminosis D   . IBS (irritable bowel syndrome)   . IgA deficiency ([place])   . Insomnia   . Left lumbosacral radiculopathy   . Moderate obstructive sleep apnea   . Osteomyelitis of foot ([place])   . PAD (peripheral artery disease) ([place])   . Pruritus   . RA (rheumatoid arthritis) ([place])   . Radiculopathy   . Restless legs syndrome   . RLS (restless legs syndrome)   . SLE (systemic lupus erythematosus related syndrome) ([place])   . Stroke ([place])   . Toxic maculopathy of both eyes     Past Surgical History:  Procedure Laterality Date  . CARDIAC CATHETERIZATION    . CAROTID ENDARTERECTOMY    . CERVICAL LAMINECTOMY    . CORONARY ANGIOPLASTY    . FRACTURE SURGERY    . fusion C5-6-7    .  HERNIA REPAIR    . KNEE ARTHROSCOPY    . TONSILLECTOMY      There were no vitals filed for this visit.  Subjective Assessment - 04/10/19 1725    Subjective   Pt. reports doing well today. No complaints of pain.    Patient is accompanied by:  Family member    Pertinent History  Pt. is a 74 y.o. 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 Pt. PMHx includes: Multiple Falls, Lupus, DJD, Renal Abscess, CVA. Pt. resides with his wife. Pt.'s wife and daughter assist with ADLs. Pt. has caregivers in  for 2 hours a day, 6 days a week. Pt. received Rehab services in acute care, at SNF for STR, and [facility] services. Pt. is retired from [employer] for [employer] and [employer].    Patient Stated Goals  To regain use of his left UE, and do more for himself.    Currently in Pain?  No/denies       OT TREATMENT    Therapeutic Activities:  Pt. Worked on using  A resistive cube checker game to enhance problem-solving, Left [place], left sided awareness, and trunk control skills. The resistive  board was placed at various vertical heights, and angles. Requiring pt. To reach up with his LUE, forward flex at his trunk, and move the checkers with his fingers for each turn. Pt. used his left hand with consistent verbal cues to complete the task. Pt. Required verbal cues for for  Forward trunk flexion.  Pt.'s transportation was late today. Pt. presented with no reports of bilateral shoulder, and neck discomfort today. Pt. presented with improve trunk control, however continues to present with weakness in the trunk with attempts for sustained upright sitting secondary to weakness through the trunk. Pt. requires verbal cues to engage his LUE during tasks today. No rest breaks were required.                        OT Long Term Goals - 04/07/19 1415      OT LONG TERM GOAL #1   Title  Pt. will increase left shoulder flexion AROM by 10 degrees to access  cabinet/shelf.    Baseline  Shoulder flexion improved to 125. Pt. continues to work on reaching with increasing emphasis on flexing trunk to improve functional reach.    Time  12    Period  Weeks    Status  On-going    Target Date  06/30/19      OT LONG TERM GOAL #2   Title  Pt. will donn a shirt with Supervision.    Baseline  MinA donning a zip down jacket with consistent cues to initiate the correct direction of the jacket.    Time  12    Period  Weeks    Status  On-going    Target Date  06/30/19      OT LONG TERM GOAL #4   Title  Pt. will improve left grip strength by 10# to be able to open a jar/container.    Baseline  Pt. continues to progress with grip strength, and continues to work towards opening containers, and bottles.    Time  12    Period  Weeks    Status  On-going    Target Date  06/30/19      OT LONG TERM GOAL #5   Title  Pt. will improve left hand [facility] skills to be able to assist with buttoning/zipping.    Baseline  Pt. is improving with manipulating the zipper on his jacket, however continues to require practice. Pt. continues to work on improving bilateral [facility] skills for buttoning/zipping    Time  12    Period  Weeks    Status  On-going    Target Date  06/30/19      OT LONG TERM GOAL #6   Title  Pt. will demonstrate visual conmpensatory strategies during 100% of the time during ADLs    Baseline  Pt. requires cues at times for left sides awareness.    Time  12    Period  Weeks    Status  On-going    Target Date  06/30/19      OT LONG TERM GOAL #7   Title  Pt. will prepare a simple cold snack from w/c with supervision using cognitive compensatory strategies 100% of the time.    Baseline  Pt. has difficulty with getting the w/c close enough to access the refrigerator      OT LONG TERM GOAL #8   Title  Pt. will accurately identify potential safety hazard using good safety awareness, and judgement 100% for ADLs, and  IADLs.    Baseline  Pt. requires cues     Time  12    Period  Weeks    Status  On-going    Target Date  06/30/19      OT LONG TERM GOAL  #9   TITLE  Pt. will  navigate his w/c around obstacles with Supervision and 100% accuracy    Baseline  Pt. continues to require cuing for obstacle on the left, however requires less cues.    Time  12    Period  Weeks    Status  On-going    Target Date  04/30/19      OT LONG TERM GOAL  #11   TITLE  Pt. will increase BUE strength by 70mm grades in preparation for ADL transfers.    Baseline  Pt. continues to present with limited BUE strength    Time  12    Period  Weeks    Status  On-going    Target Date  06/30/19            Plan - 04/10/19 1726    Clinical Impression Statement  Pt.'s transportation was late today. Pt. presented with no reports of bilateral shoulder, and neck discomfort today. Pt. presented with improve trunk control, however continues to present with weakness in the trunk with attempts for sustained upright sitting secondary to weakness through the trunk. Pt. requires verbal cues to engage his LUE during tasks today.    OT Occupational Profile and History  Problem Focused Assessment - Including review of records relating to presenting problem    Occupational performance deficits (Please refer to evaluation for details):  ADL's    Body Structure / Function / Physical Skills  UE functional use;Coordination;FMC;Dexterity;Strength;ROM    Cognitive Skills  Attention;Memory;Emotional;Problem Solve;Safety Awareness    Rehab Potential  Good    Clinical Decision Making  Several treatment options, min-mod task modification necessary    Comorbidities Affecting Occupational Performance:  Presence of comorbidities impacting occupational performance    Comorbidities impacting occupational performance description:  Phyical, cognitive, visual,  medical comorbidities    Modification or Assistance to Complete Evaluation   No modification of tasks or assist necessary to complete eval     OT Frequency  2x / week    OT Duration  12 weeks    OT Treatment/Interventions  Self-care/ADL training;DME and/or AE instruction;Therapeutic exercise;Therapeutic activities;Moist Heat;Cognitive remediation/compensation;Neuromuscular education;Visual/perceptual remediation/compensation;Coping strategies training;Patient/family education;Passive range of motion;Psychosocial skills training;Energy conservation;Functional Mobility Training    Consulted and Agree with Plan of Care  Patient    Family Member Consulted  Daughter [name]       Patient will benefit from skilled therapeutic intervention in order to improve the following deficits and impairments:   Body Structure / Function / Physical Skills: UE functional use, Coordination, FMC, Dexterity, Strength, ROM Cognitive Skills: Attention, Memory, Emotional, Problem Solve, Safety Awareness     Visit Diagnosis: Muscle weakness (generalized)  Other lack of coordination    Problem List Patient Active Problem List   Diagnosis Date Noted  . Sepsis ([place]) 10/18/2018    Harrel Carina, MS, OTR/L 04/10/2019, 5:36 PM  [place] MAIN [facility] SERVICES [address] [place], [place], [address] Phone: [phone]   Fax:  [phone]  Name: CHUKWUDI ALLENDER MRN: [id number] Date of Birth: [birth year]

## 2019-04-10 NOTE — Therapy (Signed)
[place] MAIN [facility] SERVICES [address] [place], [place], [address] Phone: [phone]   Fax:  [phone]  Physical Therapy Treatment  Patient Details  Name: Kenneth Summers MRN: [id number] Date of Birth: [birth year] Referring Provider (PT): Dr. [name], Guy Begin   Encounter Date: 04/10/2019  PT End of Session - 04/10/19 1358    Visit Number  [id number]    Number of Visits  144    Date for PT Re-Evaluation  05/23/19    Authorization Type  Medicare reporting period starting 09/11/18    Authorization Time Period  goals last assessed 03/19/19    PT Start Time  1347    PT Stop Time  1430    PT Time Calculation (min)  43 min    Equipment Utilized During Treatment  Gait belt    Activity Tolerance  Patient tolerated treatment well;No increased pain    Behavior During Therapy  WFL for tasks assessed/performed       Past Medical History:  Diagnosis Date  . Alcohol abuse   . APS (antiphospholipid syndrome) ([place])   . BPH (benign prostatic hyperplasia)   . CAD (coronary artery disease)   . Cellulitis   . Cervical spinal stenosis   . Chronic osteomyelitis ([place])   . CKD (chronic kidney disease)   . CKD (chronic kidney disease)   . Clostridium difficile diarrhea   . Depression   . Diplopia   . Fatigue   . GERD (gastroesophageal reflux disease)   . Hyperlipemia   . Hypertension   . Hypovitaminosis D   . IBS (irritable bowel syndrome)   . 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 Insomnia   . Left lumbosacral radiculopathy   . Moderate obstructive sleep apnea   . Osteomyelitis of foot ([place])   . PAD (peripheral artery disease) ([place])   . Pruritus   . RA (rheumatoid arthritis) ([place])   . Radiculopathy   . Restless legs syndrome   . RLS (restless legs syndrome)   . SLE (systemic lupus erythematosus related syndrome) ([place])   . Stroke ([place])   . Toxic maculopathy of both eyes     Past Surgical History:  Procedure Laterality Date  . CARDIAC CATHETERIZATION     . CAROTID ENDARTERECTOMY    . CERVICAL LAMINECTOMY    . CORONARY ANGIOPLASTY    . FRACTURE SURGERY    . fusion C5-6-7    . HERNIA REPAIR    . KNEE ARTHROSCOPY    . TONSILLECTOMY      There were no vitals filed for this visit.  Subjective Assessment - 04/10/19 1357    Subjective  Patient reports doing well; Denies any pain; Reports minimal fatigue;    Patient is accompained by:  Family member    Pertinent History  Patient is a 74 y.o. male who presents to outpatient physical therapy with a referral for medical diagnosis of CVA. This patient's chief complaints consist of left hemiplegia and overall deconditioning leading to the following functional deficits: dependent for ADLs, IADLs, unable to transfer to car, dependent transfers at home with hoyer lift, unable to walk or stand without significant assistance, difficulty with W/C navigation..    Currently in Pain?  No/denies                 TREATMENT:   Patient sitting in wheelchair: Instructed patient in LE strengthening exercise: -hip flexion march  x10 reps bilaterally; Pt exhibits improved ROM  bilaterally with better motor control and  LE positioning;  Following exercise:  Transferred sit<>Stand in parallel bars (seat adjusted to 27 inch from floor height) x6 reps throughout session;He was able to transfer CGA-supervision with min VCs for forward weight shift for better trunk control and transfer ability;   Standing in parallel bars: -1st rep, held for 60 sec, 6th rep held for 60 sec with CGA to close supervision with min VCs for weight shift and increased extension for better stance control;  -Side/side weight shift with supervision, for blocking LLE knee x10, x5 reps reps with B rail assist and cues for neutral positioning;  -RLE hip flexion march x5 reps with min A to shift to LLE -Pt able to progress to stepping with RLE, required max A to slide LLE forward, then stepped against with right foot with max A to  slide LLE forward, min A for safety;   Patient initially exhibits flexed posture with decreased hip extension; He was able to exhibit improved hip extension during 2nd rep and subsequent transfers; He was able to progress to supervision in standing with significant improvement in trunk control and weight shift;    Patient required min A to scoot back in chair at end of session due to increased fatigue   Response to treatment: Tolerated well. With all transfers patient required min VCs for forward weight shift and to increase hip/knee extension in stance. He was able to exhibit improved AROM with seated exercise and better motor control in standing. He continues to have decreased LLE terminal knee extension in stance which limits stance control.   Patient heavily fatigued at end of session.               PT Education - 04/10/19 1358    Education Details  transfers/positioning, exercise, HEP    Person(s) Educated  Patient;Child(ren)    Methods  Explanation;Verbal cues    Comprehension  Verbalized understanding;Returned demonstration;Verbal cues required;Need further instruction       PT Short Term Goals - 03/20/19 0846      PT SHORT TERM GOAL #1   Title  Be independent with initial home exercise program for self-management of symptoms.    Baseline  HEP to be given at second session; advice to practice unsupported sitting at home (06/19/2018); 07/08/18: Performing at home, 6/29: needs assistance but is doing exercise program regularly; 08/22/18: adherent;    Time  2    Period  Weeks    Status  Achieved    Target Date  06/26/18        PT Long Term Goals - 03/20/19 0847      PT LONG TERM GOAL #1   Title  Patient will complete all bed mobility with min A to improve functional independence for getting in and out of bed and adjusting in bed.     Baseline  max A - total A reported by family (06/19/2018); 07/08/18: still requires maxA, dtr reports improvement in rolling since starting  therapy; 6/23: min A to supervision in clinic; not doing at home right; 8/5: Pt's daughter states that his rolling has improved, but still has difficulty with all other bed mobility; 10/23/18: pts daughter states that she is doing about 35% of the work if he is in proper position, he does uses the bed rails to help roll;  using Harrel Lemon for coming to sit EOB; 10/12: pts daughter states that she is doing about 30% of the work if he is in proper position, he does uses the bed rails to  help roll, but it is improving;  using Hoyer for coming to sit EOB, 10/29: min A for sit to sidelying, rolling supervision, able to transition sidelying to sit with HHA;    Time  12    Period  Weeks    Status  Achieved    Target Date  12/04/18      PT LONG TERM GOAL #2   Title  Patient will complete sit <> stand transfer chair to chair with Min A and LRAD to improve functional independence for household and community mobility.     Baseline  variable min-modA for STS, still requires elevated surface height to 27"    Time  12    Period  Weeks    Status  Partially Met    Target Date  05/23/19      PT LONG TERM GOAL #3   Title  Patient will navigate power w/c with min A x 100 feet to improve mobility for household and short community distances.    Baseline  required total A (06/12/2018); able to roll 20 feet with min A (06/20/2018); 07/08/18: Pt can go approximately 10' without assist per family;  10/23/2018: pt's daughter states that his power WC mobility has become 100% better with the new hand control, slowed speed, and with practice; 10/12: pt's daughter reports that he can perform household WC mobility and limited community ambulation with minA    Time  12    Period  Weeks    Status  Achieved    Target Date  05/23/19      PT LONG TERM GOAL #4   Title  Patient will complete car transfers W/C to family SUV with min A using LRAD to improve his abilty to participate in community activities.     Baseline  unable to complete car  transfers at all (06/12/2018); unable to complete car transfers at all (06/19/2018).; 07/08/18: Unable to perform at this time; 09/11/18: unable to perform at this time; 10/23/2018: unable to complete car transfers at all; 10/12:  unable to complete car transfers at all, 10/29: unable; 01/13/19: unable; 2/11: min-mod A +2    Time  12    Period  Weeks    Status  Partially Met    Target Date  05/23/19      PT LONG TERM GOAL #5   Title  Patient will ambulate at least 10 feet with min A using LRAD to improve mobility for household and community distances.    Baseline  unable to take steps (06/12/2018); able to shift R leg forward and back with max A and RW at edge of plinth (06/19/2018); 07/08/18: unable to ambulate at this time. 09/11/18: unable to perform at this time; 10/23/2018: unable to perform at this time; 10/12: unable to perform at this time, 10/29: unable at this time; 01/13/19: unable at this time, 2/11: min A to step with RLE, unable to step with LLE    Time  12    Period  Weeks    Status  Revised    Target Date  05/23/19      PT LONG TERM GOAL #6   Title  Patient will be able to safety navigate up/down ramp with power chair, exhibiting good safety awareness, mod I to safely enter/exit his home.    Baseline  2/11: supervision with intermittent min A    Time  12    Period  Weeks    Status  On-going    Target Date  05/23/19  PT LONG TERM GOAL #7   Title  Patient will increase functional reach test to >15 inches in sitting to exhibit improved sitting balance and positioning and reduce fall risk;    Baseline  07/30/18: 12 inches; 09/11/18: 10-12 inches; 10/23/18: 12-13 inch; 10/12: 14.5", 10/29: 15.5 inch    Time  4    Period  Weeks    Status  Achieved    Target Date  12/04/18      PT LONG TERM GOAL #8   Title  Patient will increase LLE quad strength to 3+/5 to improve functional strength for standing and mobility;    Baseline  10/28: 2+/5; 01/13/19: 2+/5, 2/11: 3-/5    Time  12    Period  Weeks     Status  Partially Met    Target Date  05/23/19            Plan - 04/10/19 1526    Clinical Impression Statement  Patient motivated and participated well within session. He is progressing well with transfers in parallel bars with better trunk control and weight shift. He was able to maintain standing for at least 1 min several repetitions exhibiting improved strength and  cardiovascular endurance. Overall patient exhibits improved motor control this session with increased ROM and improved LE positioning with all exercise. He does report increased fatigue at end of session. He would benefit from additional skilled PT Intervention to improve strength, balance and mobility;    Comorbidities  Relevant past medical history and comorbidities include long term steroid use for lupus, CKD, chronic osteomyelitis, cervical spine stenosis, BPH, APS, alcohol abuse, peripheral artery disease, depression, GERD, obstructive sleep apnea, HTN, IBS, IgA deficiency, lumbar radiculopathy, RA, Stroke, toxic maculopathy in both eyes, systemic lupus erythematosus related syndrome, cardiac catheterization, carotid endarterectomy, cervical laminectomy, coronary angioplasty, Fusion C5-C7, knee arthroplasty, lumbar surgery.    Rehab Potential  Fair    PT Frequency  3x / week    PT Duration  12 weeks    PT Treatment/Interventions  ADLs/Self Care Home Management;Electrical Stimulation;Moist Heat;Cryotherapy;Gait training;Stair training;Functional mobility training;Therapeutic exercise;Balance training;Neuromuscular re-education;Cognitive remediation;Patient/family education;Orthotic Fit/Training;Wheelchair mobility training;Manual techniques;Passive range of motion;Energy conservation;Joint Manipulations    PT Next Visit Plan  Work on strength, sitting/standing balance, functional activities such as rolling, bed mobility, transfers; caregiver training    PT Home Exercise Plan  no updates this session    Consulted and Agree  with Plan of Care  Patient;Family member/caregiver    Family Member Consulted  daughter [name]       Patient will benefit from skilled therapeutic intervention in order to improve the following deficits and impairments:  Abnormal gait, Decreased activity tolerance, Decreased cognition, Decreased endurance, Decreased knowledge of use of DME, Decreased range of motion, Decreased skin integrity, Decreased strength, Impaired perceived functional ability, Impaired sensation, Impaired UE functional use, Improper body mechanics, Pain, Cardiopulmonary status limiting activity, Decreased balance, Decreased coordination, Decreased mobility, Difficulty walking, Impaired tone, Postural dysfunction  Visit Diagnosis: Muscle weakness (generalized)  Other lack of coordination  Difficulty in walking, not elsewhere classified  Repeated falls  Hemiplegia and hemiparesis following cerebral infarction affecting left non-dominant side [facility])     Problem List Patient Active Problem List   Diagnosis Date Noted  . Sepsis ([place]) 10/18/2018    Mylan Schwarz PT, DPT 04/10/2019, 3:29 PM  [place] MAIN [facility] SERVICES [address] [place], [place], [address] Phone: [phone]   Fax:  [phone]  Name: Kenneth Summers MRN: [id number] Date  of Birth: 1945/07/25

## 2019-04-14 ENCOUNTER — Encounter: Payer: Self-pay | Admitting: Physical Therapy

## 2019-04-14 ENCOUNTER — Ambulatory Visit: Payer: Medicare Other | Admitting: Occupational Therapy

## 2019-04-14 ENCOUNTER — Ambulatory Visit: Payer: Medicare Other | Admitting: Physical Therapy

## 2019-04-14 ENCOUNTER — Other Ambulatory Visit: Payer: Self-pay

## 2019-04-14 ENCOUNTER — Encounter: Payer: Self-pay | Admitting: Occupational Therapy

## 2019-04-14 DIAGNOSIS — M6281 Muscle weakness (generalized): Secondary | ICD-10-CM

## 2019-04-14 DIAGNOSIS — R296 Repeated falls: Secondary | ICD-10-CM

## 2019-04-14 DIAGNOSIS — R278 Other lack of coordination: Secondary | ICD-10-CM

## 2019-04-14 DIAGNOSIS — R262 Difficulty in walking, not elsewhere classified: Secondary | ICD-10-CM

## 2019-04-14 NOTE — Therapy (Addendum)
[place] MAIN [facility] SERVICES [address] [place], [place], [address] Phone: [phone]   Fax:  [phone]  Occupational Therapy Treatment  Patient Details  Name: Kenneth Summers MRN: [id number] Date of Birth: December 24, 1945 No data recorded  Encounter Date: 04/14/2019  OT End of Session - 04/14/19 2155    Visit Number  95    Number of Visits  133    Date for OT Re-Evaluation  06/30/19    Authorization Type  Progress report period starting 12/26/2018    OT Start Time  1348    OT Stop Time  1430    OT Time Calculation (min)  42 min    Activity Tolerance  Patient tolerated treatment well;Patient limited by fatigue    Behavior During Therapy  [facility] for tasks assessed/performed       Past Medical History:  Diagnosis Date  . Actinic keratosis   . Alcohol abuse   . APS (antiphospholipid syndrome) ([place])   . Basal cell carcinoma   . BPH (benign prostatic hyperplasia)   . CAD (coronary artery disease)   . Cellulitis   . Cervical spinal stenosis   . Chronic osteomyelitis ([place])   . CKD (chronic kidney disease)   . CKD (chronic kidney disease)   . Clostridium difficile diarrhea   . Depression   . Diplopia   . Fatigue   . GERD (gastroesophageal reflux disease)   . Hyperlipemia   . Hypertension   . Hypovitaminosis D   . IBS (irritable bowel syndrome)   . IgA deficiency ([place])   . Insomnia   . Left lumbosacral radiculopathy   . Moderate obstructive sleep apnea   . Osteomyelitis of foot ([place])   . PAD (peripheral artery disease) ([place])   . Pruritus   . RA (rheumatoid arthritis) ([place])   . Radiculopathy   . Restless legs syndrome   . RLS (restless legs syndrome)   . SLE (systemic lupus erythematosus related syndrome) ([place])   . 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 Stroke ([place])   . Toxic maculopathy of both eyes     Past Surgical History:  Procedure Laterality Date  . CARDIAC CATHETERIZATION    .  CAROTID ENDARTERECTOMY    . CERVICAL LAMINECTOMY    . CORONARY ANGIOPLASTY    . FRACTURE SURGERY    . fusion C5-6-7    . HERNIA REPAIR    . KNEE ARTHROSCOPY    . TONSILLECTOMY      There were no vitals filed for this visit.  Subjective Assessment - 04/14/19 2154    Subjective   Pt. reports doing well today. No complaints of pain.    Patient is accompanied by:  Family member    Patient Stated Goals  To regain use of his left UE, and do more for himself.    Currently in Pain?  Yes    Pain Score  2     Pain Location  Shoulder    Pain Orientation  Left    Pain Descriptors / Indicators  Aching;Sore      OT TREATMENT    Therapeutic Activities:  Pt. worked on using his left hand for grasping, and placing 2" large pegs on the Instruct[employer] board placed at a tabletop surface. Pt. Worked on following [employer] Pt. required cues to follow the design pattern, and place the pegs in the accurate spot.  Therapeutic Exercise:  Pt. Performed AROM  for neck flexion, lateral flexion to the right and left, and rotation to the right, and left.  Pt. has 2/10 neck, and left shoulder, wrist, and  digit pain. Pt. required multiple reclining restbreaks. Pt. required less cues to engage his hand during the task today, however required more visual cues to follow the design pattern. Pt. with limited activity tolerance when engaging the trunk today. Pt. Tolerated heat, and ROM well.                     OT Education - 04/14/19 2155    Education Details  06/30/2019    Person(s) Educated  Patient    Methods  Explanation;Demonstration    Comprehension  Returned demonstration;Tactile cues required;Verbalized understanding;Need further instruction          OT Long Term Goals - 04/07/19 1415      OT LONG TERM GOAL #1   Title  Pt. will increase left shoulder flexion AROM by 10 degrees to access cabinet/shelf.    Baseline  Shoulder flexion improved to 125. Pt. continues to  work on reaching with increasing emphasis on flexing trunk to improve functional reach.    Time  12    Period  Weeks    Status  On-going    Target Date  06/30/19      OT LONG TERM GOAL #2   Title  Pt. will donn a shirt with Supervision.    Baseline  MinA donning a zip down jacket with consistent cues to initiate the correct direction of the jacket.    Time  12    Period  Weeks    Status  On-going    Target Date  06/30/19      OT LONG TERM GOAL #4   Title  Pt. will improve left grip strength by 10# to be able to open a jar/container.    Baseline  Pt. continues to progress with grip strength, and continues to work towards opening containers, and bottles.    Time  12    Period  Weeks    Status  On-going    Target Date  06/30/19      OT LONG TERM GOAL #5   Title  Pt. will improve left hand [facility] skills to be able to assist with buttoning/zipping.    Baseline  Pt. is improving with manipulating the zipper on his jacket, however continues to require practice. Pt. continues to work on improving bilateral [facility] skills for buttoning/zipping    Time  12    Period  Weeks    Status  On-going    Target Date  06/30/19      OT LONG TERM GOAL #6   Title  Pt. will demonstrate visual conmpensatory strategies during 100% of the time during ADLs    Baseline  Pt. requires cues at times for left sides awareness.    Time  12    Period  Weeks    Status  On-going    Target Date  06/30/19      OT LONG TERM GOAL #7   Title  Pt. will prepare a simple cold snack from w/c with supervision using cognitive compensatory strategies 100% of the time.    Baseline  Pt. has difficulty with getting the w/c close enough to access the refrigerator      OT LONG TERM GOAL #8   Title  Pt. will accurately identify potential safety hazard using good safety awareness, and judgement 100% for ADLs, and  IADLs.    Baseline  Pt. requires cues    Time  12    Period  Weeks    Status  On-going    Target Date  06/30/19       OT LONG TERM GOAL  #9   TITLE  Pt. will  navigate his w/c around obstacles with Supervision and 100% accuracy    Baseline  Pt. continues to require cuing for obstacle on the left, however requires less cues.    Time  12    Period  Weeks    Status  On-going    Target Date  04/30/19      OT LONG TERM GOAL  #11   TITLE  Pt. will increase BUE strength by 37mm grades in preparation for ADL transfers.    Baseline  Pt. continues to present with limited BUE strength    Time  12    Period  Weeks    Status  On-going    Target Date  06/30/19            Plan - 04/14/19 2156    Clinical Impression Statement Pt. has 2/10 neck, and left shoulder, wrist, and  digit pain. Pt. required multiple reclining restbreaks. Pt. required less cues to engage his hand during the task today, however required more visual cues to follow the design pattern. Pt. with limited activity tolerance when engaging the trunk today. Pt. Tolerated heat, and ROM well.   OT Occupational Profile and History  Problem Focused Assessment - Including review of records relating to presenting problem    Occupational performance deficits (Please refer to evaluation for details):  ADL's    Body Structure / Function / Physical Skills  UE functional use;Coordination;FMC;Dexterity;Strength;ROM    Cognitive Skills  Attention;Memory;Emotional;Problem Solve;Safety Awareness    Rehab Potential  Good    Clinical Decision Making  Several treatment options, min-mod task modification necessary    Comorbidities Affecting Occupational Performance:  Presence of comorbidities impacting occupational performance    Comorbidities impacting occupational performance description:  Phyical, cognitive, visual,  medical comorbidities    Modification or Assistance to Complete Evaluation   No modification of tasks or assist necessary to complete eval    OT Frequency  2x / week    OT Duration  12 weeks    OT Treatment/Interventions  Self-care/ADL training;DME  and/or AE instruction;Therapeutic exercise;Therapeutic activities;Moist Heat;Cognitive remediation/compensation;Neuromuscular education;Visual/perceptual remediation/compensation;Coping strategies training;Patient/family education;Passive range of motion;Psychosocial skills training;Energy conservation;Functional Mobility Training    Recommended Other Services  PT    Consulted and Agree with Plan of Care  Patient       Patient will benefit from skilled therapeutic intervention in order to improve the following deficits and impairments:   Body Structure / Function / Physical Skills: UE functional use, Coordination, FMC, Dexterity, Strength, ROM Cognitive Skills: Attention, Memory, Emotional, Problem Solve, Safety Awareness     Visit Diagnosis: Muscle weakness (generalized)  Other lack of coordination    Problem List Patient Active Problem List   Diagnosis Date Noted  . Sepsis ([place]) 10/18/2018    Harrel Carina, MS, OTR/L 04/14/2019, 10:06 PM  [place] MAIN [facility] SERVICES [address] [place], [place], [address] Phone: [phone]   Fax:  [phone]  Name: Kenneth Summers MRN: [id number] Date of Birth: 12-18-1945

## 2019-04-14 NOTE — Therapy (Signed)
[place] MAIN [facility] SERVICES [address] [place], [place], [address] Phone: [phone]   Fax:  [phone]  Physical Therapy Treatment  Patient Details  Name: Kenneth Summers MRN: [id number] Date of Birth: February 08, 1945 Referring Provider (PT): Dr. [name], Guy Begin   Encounter Date: 04/14/2019  PT End of Session - 04/14/19 1447    Visit Number  [id number]    Number of Visits  144    Date for PT Re-Evaluation  05/23/19    Authorization Type  Medicare reporting period starting 09/11/18    Authorization Time Period  goals last assessed 03/19/19    PT Start Time  1302    PT Stop Time  1345    PT Time Calculation (min)  43 min    Equipment Utilized During Treatment  Gait belt    Activity Tolerance  Patient tolerated treatment well;No increased pain    Behavior During Therapy  WFL for tasks assessed/performed       Past Medical History:  Diagnosis Date  . Actinic keratosis   . Alcohol abuse   . APS (antiphospholipid syndrome) ([place])   . Basal cell carcinoma   . BPH (benign prostatic hyperplasia)   . CAD (coronary artery disease)   . Cellulitis   . Cervical spinal stenosis   . Chronic osteomyelitis ([place])   . CKD (chronic kidney disease)   . CKD (chronic kidney disease)   . Clostridium difficile diarrhea   . Depression   . Diplopia   . Fatigue   . GERD (gastroesophageal reflux disease)   . Hyperlipemia   . Hypertension   . Hypovitaminosis D   . IBS (irritable bowel syndrome)   . 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 Insomnia   . Left lumbosacral radiculopathy   . Moderate obstructive sleep apnea   . Osteomyelitis of foot ([place])   . PAD (peripheral artery disease) ([place])   . Pruritus   . RA (rheumatoid arthritis) ([place])   . Radiculopathy   . Restless legs syndrome   . RLS (restless legs syndrome)   . SLE (systemic lupus erythematosus related syndrome) ([place])   . 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 Stroke  ([place])   . Toxic maculopathy of both eyes     Past Surgical History:  Procedure Laterality Date  . CARDIAC CATHETERIZATION    . CAROTID ENDARTERECTOMY    . CERVICAL LAMINECTOMY    . CORONARY ANGIOPLASTY    . FRACTURE SURGERY    . fusion C5-6-7    . HERNIA REPAIR    . KNEE ARTHROSCOPY    . TONSILLECTOMY      There were no vitals filed for this visit.  Subjective Assessment - 04/14/19 1445    Subjective  Patient reports doing okay. He reports increased LUE discomfort as a result of nerve pain from cervical impingement. He states, "I must have slept wrong last night." reports taking OTC pain meds prior to session.    Patient is accompained by:  Family member    Pertinent History  Patient is a 74 y.o. male who presents to outpatient physical therapy with a referral for medical diagnosis of CVA. This patient's chief complaints consist of left hemiplegia and overall deconditioning leading to the following functional deficits: dependent for ADLs, IADLs, unable to transfer to car, dependent transfers at home with hoyer lift, unable to walk or stand without significant assistance, difficulty with W/C navigation..    Currently  in Pain?  Yes    Pain Score  8     Pain Location  Arm    Pain Orientation  Left    Pain Descriptors / Indicators  Aching;Sore    Pain Type  Acute pain    Pain Radiating Towards  radiates down LUE to hand    Pain Onset  Today    Pain Frequency  Intermittent    Aggravating Factors   worse with sitting forward, pushing down through LUE    Pain Relieving Factors  rest/heat    Effect of Pain on Daily Activities  decreased activity tolerance;    Multiple Pain Sites  No               TREATMENT: Prior to transfers:  Patient sittingin wheelchair: Instructed patient in LE strengthening exercise: -hip flexion marchx15 reps bilaterally; -Hip adduction ball squeeze x15 reps -LAQ x10 reps bilaterally; Pt exhibits improved ROM bilaterally with better motor  control and LE positioning; During seated exercise, PT applied moist heat to cervical paraspinals for better tolerance and to help reduce pain;   Following exercise:  Transferred sit<>Stand in parallel bars (seat adjusted to 27 inch from floor height) x6 reps throughout session;He was able to transferCGA-supervision with min VCs for forward weight shift for better trunk control and transfer ability;  Standing in parallel bars: -1st rep, held for 60 sec, 5th rep held for 30 sec with CGA to close supervision with min VCs for weight shift and increased extension for better stance control;  -Side/side weight shift with CGA, x10, with B rail assist and cues for neutral positioning; -RLE hip flexion march x10 reps with min A to shift to LLE -Forward weight shift with BLE heel raises x10 reps;   Patient initially exhibits flexed posture with decreased hip extension; He was able to exhibit improved hip extension during 2nd rep and subsequent transfers; He was able to progress to supervision in standing with significant improvement in trunk control and weight shift;   Patient required minA to scoot back in chair at end of session due to increased fatigue  Response to treatment: [place].With all transfers patient required min VCs for forward weight shift and to increase hip/knee extension in stance.He was able to exhibit improved AROM with seated exercise and better motor control in standing. He continues to have decreased LLE terminal knee extension in stance which limits stance control. Patient heavily fatigued at end of session.                 PT Education - 04/14/19 1446    Education Details  transfers/positioning, HEP    Person(s) Educated  Patient;Child(ren)    Methods  Explanation;Verbal cues    Comprehension  Verbalized understanding;Returned demonstration;Verbal cues required;Need further instruction       PT Short Term Goals - 03/20/19 0846      PT  SHORT TERM GOAL #1   Title  Be independent with initial home exercise program for self-management of symptoms.    Baseline  HEP to be given at second session; advice to practice unsupported sitting at home (06/19/2018); 07/08/18: Performing at home, 6/29: needs assistance but is doing exercise program regularly; 08/22/18: adherent;    Time  2    Period  Weeks    Status  Achieved    Target Date  06/26/18        PT Long Term Goals - 03/20/19 0847      PT LONG TERM GOAL #1  Title  Patient will complete all bed mobility with min A to improve functional independence for getting in and out of bed and adjusting in bed.     Baseline  max A - total A reported by family (06/19/2018); 07/08/18: still requires maxA, dtr reports improvement in rolling since starting therapy; 6/23: min A to supervision in clinic; not doing at home right; 8/5: Pt's daughter states that his rolling has improved, but still has difficulty with all other bed mobility; 10/23/18: pts daughter states that she is doing about 35% of the work if he is in proper position, he does uses the bed rails to help roll;  using Hoyer for coming to sit EOB; 10/12: pts daughter states that she is doing about 30% of the work if he is in proper position, he does uses the bed rails to help roll, but it is improving;  using Hoyer for coming to sit EOB, 10/29: min A for sit to sidelying, rolling supervision, able to transition sidelying to sit with HHA;    Time  12    Period  Weeks    Status  Achieved    Target Date  12/04/18      PT LONG TERM GOAL #2   Title  Patient will complete sit <> stand transfer chair to chair with Min A and LRAD to improve functional independence for household and community mobility.     Baseline  variable min-modA for STS, still requires elevated surface height to 27"    Time  12    Period  Weeks    Status  Partially Met    Target Date  05/23/19      PT LONG TERM GOAL #3   Title  Patient will navigate power w/c with min A x  100 feet to improve mobility for household and short community distances.    Baseline  required total A (06/12/2018); able to roll 20 feet with min A (06/20/2018); 07/08/18: Pt can go approximately 10' without assist per family;  10/23/2018: pt's daughter states that his power WC mobility has become 100% better with the new hand control, slowed speed, and with practice; 10/12: pt's daughter reports that he can perform household WC mobility and limited community ambulation with minA    Time  12    Period  Weeks    Status  Achieved    Target Date  05/23/19      PT LONG TERM GOAL #4   Title  Patient will complete car transfers W/C to family SUV with min A using LRAD to improve his abilty to participate in community activities.     Baseline  unable to complete car transfers at all (06/12/2018); unable to complete car transfers at all (06/19/2018).; 07/08/18: Unable to perform at this time; 09/11/18: unable to perform at this time; 10/23/2018: unable to complete car transfers at all; 10/12:  unable to complete car transfers at all, 10/29: unable; 01/13/19: unable; 2/11: min-mod A +2    Time  12    Period  Weeks    Status  Partially Met    Target Date  05/23/19      PT LONG TERM GOAL #5   Title  Patient will ambulate at least 10 feet with min A using LRAD to improve mobility for household and community distances.    Baseline  unable to take steps (06/12/2018); able to shift R leg forward and back with max A and RW at edge of plinth (06/19/2018); 07/08/18: unable to  ambulate at this time. 09/11/18: unable to perform at this time; 10/23/2018: unable to perform at this time; 10/12: unable to perform at this time, 10/29: unable at this time; 01/13/19: unable at this time, 2/11: min A to step with RLE, unable to step with LLE    Time  12    Period  Weeks    Status  Revised    Target Date  05/23/19      PT LONG TERM GOAL #6   Title  Patient will be able to safety navigate up/down ramp with power chair, exhibiting good safety  awareness, mod I to safely enter/exit his home.    Baseline  2/11: supervision with intermittent min A    Time  12    Period  Weeks    Status  On-going    Target Date  05/23/19      PT LONG TERM GOAL #7   Title  Patient will increase functional reach test to >15 inches in sitting to exhibit improved sitting balance and positioning and reduce fall risk;    Baseline  07/30/18: 12 inches; 09/11/18: 10-12 inches; 10/23/18: 12-13 inch; 10/12: 14.5", 10/29: 15.5 inch    Time  4    Period  Weeks    Status  Achieved    Target Date  12/04/18      PT LONG TERM GOAL #8   Title  Patient will increase LLE quad strength to 3+/5 to improve functional strength for standing and mobility;    Baseline  10/28: 2+/5; 01/13/19: 2+/5, 2/11: 3-/5    Time  12    Period  Weeks    Status  Partially Met    Target Date  05/23/19            Plan - 04/14/19 1447    Clinical Impression Statement  Patient motivated and participated well within session. Patient did initially report increased LUE discomfort. However during therapy session he reports no pain during rest. PT did apply moist heat to cervical paraspinals in sitting during LE exercise for better tolerance. Patient continues to progress in LE strength with increased standing tolerance and mobility. He does fatigue quickly in standing requiring cues for breath support and weight shift. Patient would benefit from additional skilled PT intervention to improve strength and mobility;    Comorbidities  Relevant past medical history and comorbidities include long term steroid use for lupus, CKD, chronic osteomyelitis, cervical spine stenosis, BPH, APS, alcohol abuse, peripheral artery disease, depression, GERD, obstructive sleep apnea, HTN, IBS, IgA deficiency, lumbar radiculopathy, RA, Stroke, toxic maculopathy in both eyes, systemic lupus erythematosus related syndrome, cardiac catheterization, carotid endarterectomy, cervical laminectomy, coronary angioplasty, Fusion  C5-C7, knee arthroplasty, lumbar surgery.    Rehab Potential  Fair    PT Frequency  3x / week    PT Duration  12 weeks    PT Treatment/Interventions  ADLs/Self Care Home Management;Electrical Stimulation;Moist Heat;Cryotherapy;Gait training;Stair training;Functional mobility training;Therapeutic exercise;Balance training;Neuromuscular re-education;Cognitive remediation;Patient/family education;Orthotic Fit/Training;Wheelchair mobility training;Manual techniques;Passive range of motion;Energy conservation;Joint Manipulations    PT Next Visit Plan  Work on strength, sitting/standing balance, functional activities such as rolling, bed mobility, transfers; caregiver training    PT Home Exercise Plan  no updates this session    Consulted and Agree with Plan of Care  Patient;Family member/caregiver    Family Member Consulted  daughter [name]       Patient will benefit from skilled therapeutic intervention in order to improve the following deficits and impairments:  Abnormal gait,  Decreased activity tolerance, Decreased cognition, Decreased endurance, Decreased knowledge of use of DME, Decreased range of motion, Decreased skin integrity, Decreased strength, Impaired perceived functional ability, Impaired sensation, Impaired UE functional use, Improper body mechanics, Pain, Cardiopulmonary status limiting activity, Decreased balance, Decreased coordination, Decreased mobility, Difficulty walking, Impaired tone, Postural dysfunction  Visit Diagnosis: Muscle weakness (generalized)  Other lack of coordination  Difficulty in walking, not elsewhere classified  Repeated falls     Problem List Patient Active Problem List   Diagnosis Date Noted  . Sepsis ([place]) 10/18/2018    Shriyans Kuenzi PT, DPT 04/14/2019, 2:53 PM  [place] MAIN [facility] SERVICES [address] [place], [place], [address] Phone: [phone]   Fax:  [phone]  Name: Kenneth Summers MRN:  [id number] Date of Birth: [birth year]

## 2019-04-16 ENCOUNTER — Other Ambulatory Visit: Payer: Self-pay

## 2019-04-16 ENCOUNTER — Ambulatory Visit: Payer: Medicare Other | Admitting: Physical Therapy

## 2019-04-16 ENCOUNTER — Encounter: Payer: Self-pay | Admitting: Physical Therapy

## 2019-04-16 DIAGNOSIS — R278 Other lack of coordination: Secondary | ICD-10-CM

## 2019-04-16 DIAGNOSIS — M6281 Muscle weakness (generalized): Secondary | ICD-10-CM

## 2019-04-16 DIAGNOSIS — R262 Difficulty in walking, not elsewhere classified: Secondary | ICD-10-CM

## 2019-04-16 DIAGNOSIS — R296 Repeated falls: Secondary | ICD-10-CM

## 2019-04-16 NOTE — Therapy (Signed)
[place] MAIN [facility] SERVICES [address] [place], [place], [address] Phone: [phone]   Fax:  [phone]  Physical Therapy Treatment Physical Therapy Progress Note   Dates of reporting period  03/20/19  to   04/16/19   Patient Details  Name: Kenneth Summers MRN: [id number] Date of Birth: [birth year] Referring Provider (PT): Dr. [name], Guy Begin   Encounter Date: 04/16/2019  PT End of Session - 04/16/19 1300    Visit Number  110    Number of Visits  144    Date for PT Re-Evaluation  05/23/19    Authorization Type  Medicare reporting period starting 09/11/18    Authorization Time Period  goals last assessed 03/19/19    PT Start Time  1302    PT Stop Time  1345    PT Time Calculation (min)  43 min    Equipment Utilized During Treatment  Gait belt    Activity Tolerance  Patient tolerated treatment well;No increased pain    Behavior During Therapy  WFL for tasks assessed/performed       Past Medical History:  Diagnosis Date  . Actinic keratosis   . Alcohol abuse   . APS (antiphospholipid syndrome) ([place])   . Basal cell carcinoma   . BPH (benign prostatic hyperplasia)   . CAD (coronary artery disease)   . Cellulitis   . Cervical spinal stenosis   . Chronic osteomyelitis ([place])   . CKD (chronic kidney disease)   . CKD (chronic kidney disease)   . Clostridium difficile diarrhea   . Depression   . Diplopia   . Fatigue   . GERD (gastroesophageal reflux disease)   . Hyperlipemia   . Hypertension   . Hypovitaminosis D   . IBS (irritable bowel syndrome)   . 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 Insomnia   . Left lumbosacral radiculopathy   . Moderate obstructive sleep apnea   . Osteomyelitis of foot ([place])   . PAD (peripheral artery disease) ([place])   . Pruritus   . RA (rheumatoid arthritis) ([place])   . Radiculopathy   . Restless legs syndrome   . RLS (restless legs syndrome)   . SLE (systemic lupus erythematosus related syndrome) ([place])    . 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 Stroke ([place])   . Toxic maculopathy of both eyes     Past Surgical History:  Procedure Laterality Date  . CARDIAC CATHETERIZATION    . CAROTID ENDARTERECTOMY    . CERVICAL LAMINECTOMY    . CORONARY ANGIOPLASTY    . FRACTURE SURGERY    . fusion C5-6-7    . HERNIA REPAIR    . KNEE ARTHROSCOPY    . TONSILLECTOMY      There were no vitals filed for this visit.  Subjective Assessment - 04/17/19 0859    Subjective  Patient reports doing well. He reports continued LUE discomfort but states that its not as bad today. He reports that it was worse when looking up.    Patient is accompained by:  Family member    Pertinent History  Patient is a 74 y.o. male who presents to outpatient physical therapy with a referral for medical diagnosis of CVA. This patient's chief complaints consist of left hemiplegia and overall deconditioning leading to the following functional deficits: dependent for ADLs, IADLs, unable to transfer to car, dependent transfers at home with hoyer lift, unable to walk or stand without significant  assistance, difficulty with W/C navigation..    Currently in Pain?  Yes    Pain Score  5     Pain Location  Arm    Pain Orientation  Left    Pain Descriptors / Indicators  Aching;Sore    Pain Type  Acute pain    Pain Onset  In the past 7 days    Pain Frequency  Intermittent    Aggravating Factors   worse with looking up/cervical extension    Pain Relieving Factors  rest/heat    Effect of Pain on Daily Activities  decreased activity tolerance;    Multiple Pain Sites  No              TREATMENT: Prior to transfers:  Patient sittingin wheelchair: Instructed patient in LE strengthening exercise: -LAQ x10 reps bilaterally; Pt exhibits improved ROMbilaterally with better motor control and LE positioning; During seated exercise, PT applied moist heat to cervical paraspinals for better  tolerance and to help reduce pain;   Following exercise: Transferred sit<>Stand in parallel bars (seat adjusted to 27 inch from floor height) x6reps throughout session;He was able to transferCGA-supervision withminVCs for forward weight shift for better trunk control and transfer ability;  Standing in parallel bars: -1st rep, held for 1-2 min with standing activities, CGA to close supervision with min VCs for weight shift and increased extension for better stance control;  -Side/side weight shift with CGA,x10, with B rail assist and cues for neutral positioning; -Shift to neutral position, alternate UE lift x5 reps bilaterally;  -RLE hip flexion march x10 reps with min A to shift to LLE -LLE hip flexion march x  5 Reps with min A for weight shift and erect posture;  -mini squat x10 reps with  Min A for proper positioning and weight shift;    Patient initially exhibits flexed posture with decreased hip extension; He was able to exhibit improved hip extension during  subsequent transfers; Although he continues to require min A for safety and trunk control;   Patient required minA to scoot back in chair at end of session due to increased fatigue  Response to treatment: [place].With all transfers patient requiredminVCs for forward weight shift and to increase hip/knee extension in stance.He was able toexhibit improved AROM with seated exerciseand better motor control in standing.He continues to have decreased LLE terminal knee extension in stance which limits stance control. Patient heavily fatigued at end of session.Vitals monitored during session, HR 84 following standing;  [name] condition has the potential to improve in response to therapy. Maximum improvement is yet to be obtained. The anticipated improvement is attainable and reasonable in a generally predictable time.  Patient reports adherence with HEP and is motivated to improve transfer ability for  increased ADL ability;                    PT Education - 04/16/19 1300    Education Details  transfers/positioning, HEP    Person(s) Educated  Patient;Child(ren)    Methods  Explanation;Verbal cues    Comprehension  Verbalized understanding;Returned demonstration;Verbal cues required;Need further instruction       PT Short Term Goals - 04/17/19 0902      PT SHORT TERM GOAL #1   Title  Be independent with initial home exercise program for self-management of symptoms.    Baseline  HEP to be given at second session; advice to practice unsupported sitting at home (06/19/2018); 07/08/18: Performing at home, 6/29: needs assistance but is doing  exercise program regularly; 08/22/18: adherent;    Time  2    Period  Weeks    Status  Achieved    Target Date  06/26/18        PT Long Term Goals - 04/17/19 0902      PT LONG TERM GOAL #1   Title  Patient will complete all bed mobility with min A to improve functional independence for getting in and out of bed and adjusting in bed.     Baseline  max A - total A reported by family (06/19/2018); 07/08/18: still requires maxA, dtr reports improvement in rolling since starting therapy; 6/23: min A to supervision in clinic; not doing at home right; 8/5: Pt's daughter states that his rolling has improved, but still has difficulty with all other bed mobility; 10/23/18: pts daughter states that she is doing about 35% of the work if he is in proper position, he does uses the bed rails to help roll;  using Hoyer for coming to sit EOB; 10/12: pts daughter states that she is doing about 30% of the work if he is in proper position, he does uses the bed rails to help roll, but it is improving;  using Hoyer for coming to sit EOB, 10/29: min A for sit to sidelying, rolling supervision, able to transition sidelying to sit with HHA;    Time  12    Period  Weeks    Status  Achieved    Target Date  12/04/18      PT LONG TERM GOAL #2   Title  Patient will  complete sit <> stand transfer chair to chair with Min A and LRAD to improve functional independence for household and community mobility.     Baseline  variable min-modA for STS, still requires elevated surface height to 27"    Time  12    Period  Weeks    Status  Partially Met    Target Date  05/23/19      PT LONG TERM GOAL #3   Title  Patient will navigate power w/c with min A x 100 feet to improve mobility for household and short community distances.    Baseline  required total A (06/12/2018); able to roll 20 feet with min A (06/20/2018); 07/08/18: Pt can go approximately 10' without assist per family;  10/23/2018: pt's daughter states that his power WC mobility has become 100% better with the new hand control, slowed speed, and with practice; 10/12: pt's daughter reports that he can perform household WC mobility and limited community ambulation with minA    Time  12    Period  Weeks    Status  Achieved    Target Date  05/23/19      PT LONG TERM GOAL #4   Title  Patient will complete car transfers W/C to family SUV with min A using LRAD to improve his abilty to participate in community activities.     Baseline  unable to complete car transfers at all (06/12/2018); unable to complete car transfers at all (06/19/2018).; 07/08/18: Unable to perform at this time; 09/11/18: unable to perform at this time; 10/23/2018: unable to complete car transfers at all; 10/12:  unable to complete car transfers at all, 10/29: unable; 01/13/19: unable; 2/11: min-mod A +2, 3/10: min A+1    Time  12    Period  Weeks    Status  Achieved    Target Date  05/23/19      PT LONG  TERM GOAL #5   Title  Patient will ambulate at least 10 feet with min A using LRAD to improve mobility for household and community distances.    Baseline  unable to take steps (06/12/2018); able to shift R leg forward and back with max A and RW at edge of plinth (06/19/2018); 07/08/18: unable to ambulate at this time. 09/11/18: unable to perform at this time;  10/23/2018: unable to perform at this time; 10/12: unable to perform at this time, 10/29: unable at this time; 01/13/19: unable at this time, 2/11: min A to step with RLE, unable to step with LLE, 3/10: able to take 2 steps in parallel bars with min A for safety;    Time  12    Period  Weeks    Status  Partially Met    Target Date  05/23/19      PT LONG TERM GOAL #6   Title  Patient will be able to safety navigate up/down ramp with power chair, exhibiting good safety awareness, mod I to safely enter/exit his home.    Baseline  2/11: supervision with intermittent min A    Time  12    Period  Weeks    Status  On-going    Target Date  05/23/19      PT LONG TERM GOAL #7   Title  Patient will increase functional reach test to >15 inches in sitting to exhibit improved sitting balance and positioning and reduce fall risk;    Baseline  07/30/18: 12 inches; 09/11/18: 10-12 inches; 10/23/18: 12-13 inch; 10/12: 14.5", 10/29: 15.5 inch    Time  4    Period  Weeks    Status  Achieved    Target Date  05/23/19      PT LONG TERM GOAL #8   Title  Patient will increase LLE quad strength to 3+/5 to improve functional strength for standing and mobility;    Baseline  10/28: 2+/5; 01/13/19: 2+/5, 2/11: 3-/5, 04/16/19: 3-/5    Time  12    Period  Weeks    Status  Partially Met    Target Date  05/23/19            Plan - 04/17/19 0900    Clinical Impression Statement  Patient motivated and participated well within session. He is progressing well with transfers being able to exhibit better neutral weight shift and achieve improved postural control. He does require cues for weight shift and continues to have decreased LLE knee extension in stance due to flexion contracture and weakness. Patient is progressing in standing exercise with improved foot clearance with standing marches. He does require min A for postural control and weight shift to facilitate better stance control. Patient does fatigue quickly  requiring short seated rest breaks throughout session. He would benefit from additional skilled PT intervention to improve strength, balance and mobility;    Comorbidities  Relevant past medical history and comorbidities include long term steroid use for lupus, CKD, chronic osteomyelitis, cervical spine stenosis, BPH, APS, alcohol abuse, peripheral artery disease, depression, GERD, obstructive sleep apnea, HTN, IBS, IgA deficiency, lumbar radiculopathy, RA, Stroke, toxic maculopathy in both eyes, systemic lupus erythematosus related syndrome, cardiac catheterization, carotid endarterectomy, cervical laminectomy, coronary angioplasty, Fusion C5-C7, knee arthroplasty, lumbar surgery.    Rehab Potential  Fair    PT Frequency  3x / week    PT Duration  12 weeks    PT Treatment/Interventions  ADLs/Self Care Home Management;Electrical Stimulation;Moist Heat;Cryotherapy;Gait training;Stair training;Functional  mobility training;Therapeutic exercise;Balance training;Neuromuscular re-education;Cognitive remediation;Patient/family education;Orthotic Fit/Training;Wheelchair mobility training;Manual techniques;Passive range of motion;Energy conservation;Joint Manipulations    PT Next Visit Plan  Work on strength, sitting/standing balance, functional activities such as rolling, bed mobility, transfers; caregiver training    PT Home Exercise Plan  no updates this session    Consulted and Agree with Plan of Care  Patient;Family member/caregiver    Family Member Consulted  daughter [name]       Patient will benefit from skilled therapeutic intervention in order to improve the following deficits and impairments:  Abnormal gait, Decreased activity tolerance, Decreased cognition, Decreased endurance, Decreased knowledge of use of DME, Decreased range of motion, Decreased skin integrity, Decreased strength, Impaired perceived functional ability, Impaired sensation, Impaired UE functional use, Improper body mechanics, Pain,  Cardiopulmonary status limiting activity, Decreased balance, Decreased coordination, Decreased mobility, Difficulty walking, Impaired tone, Postural dysfunction  Visit Diagnosis: Muscle weakness (generalized)  Other lack of coordination  Difficulty in walking, not elsewhere classified  Repeated falls     Problem List Patient Active Problem List   Diagnosis Date Noted  . Sepsis ([place]) 10/18/2018    Soua Lenk PT, DPT 04/17/2019, 9:05 AM  [place] [facility] MAIN [facility] SERVICES [address] [place], [place], [address] Phone: [phone]   Fax:  [phone]  Name: FABRIZIO FILIP MRN: [id number] Date of Birth: [birth year]

## 2019-04-17 ENCOUNTER — Ambulatory Visit: Payer: Medicare Other | Admitting: Physical Therapy

## 2019-04-17 ENCOUNTER — Encounter: Payer: Self-pay | Admitting: Occupational Therapy

## 2019-04-17 ENCOUNTER — Other Ambulatory Visit: Payer: Self-pay

## 2019-04-17 ENCOUNTER — Ambulatory Visit: Payer: Medicare Other | Admitting: Occupational Therapy

## 2019-04-17 ENCOUNTER — Encounter: Payer: Self-pay | Admitting: Physical Therapy

## 2019-04-17 DIAGNOSIS — R278 Other lack of coordination: Secondary | ICD-10-CM

## 2019-04-17 DIAGNOSIS — M6281 Muscle weakness (generalized): Secondary | ICD-10-CM

## 2019-04-17 DIAGNOSIS — R296 Repeated falls: Secondary | ICD-10-CM

## 2019-04-17 DIAGNOSIS — R262 Difficulty in walking, not elsewhere classified: Secondary | ICD-10-CM

## 2019-04-17 NOTE — Therapy (Signed)
[name] [facility] SERVICES [address] [place], [place], [address] Phone: [phone]   Fax:  [phone]  Occupational Therapy Treatment  Patient Details  Name: Kenneth Summers MRN: [id number] Date of Birth: 1945-12-31 No data recorded  Encounter Date: 04/17/2019  OT End of Session - 04/17/19 1629    Visit Number  96    Number of Visits  133    Date for OT Re-Evaluation  06/30/19    Authorization Type  Progress report period starting 12/26/2018    OT Start Time  1300    OT Stop Time  1345    OT Time Calculation (min)  45 min    Activity Tolerance  Patient tolerated treatment well;Patient limited by fatigue    Behavior During Therapy  [facility] for tasks assessed/performed       Past Medical History:  Diagnosis Date  . Actinic keratosis   . Alcohol abuse   . APS (antiphospholipid syndrome) ([place])   . Basal cell carcinoma   . BPH (benign prostatic hyperplasia)   . CAD (coronary artery disease)   . Cellulitis   . Cervical spinal stenosis   . Chronic osteomyelitis ([place])   . CKD (chronic kidney disease)   . CKD (chronic kidney disease)   . Clostridium difficile diarrhea   . Depression   . Diplopia   . Fatigue   . GERD (gastroesophageal reflux disease)   . Hyperlipemia   . Hypertension   . Hypovitaminosis D   . IBS (irritable bowel syndrome)   . 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 Insomnia   . Left lumbosacral radiculopathy   . Moderate obstructive sleep apnea   . Osteomyelitis of foot ([place])   . PAD (peripheral artery disease) ([place])   . Pruritus   . RA (rheumatoid arthritis) ([place])   . Radiculopathy   . Restless legs syndrome   . RLS (restless legs syndrome)   . SLE (systemic lupus erythematosus related syndrome) ([place])   . 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 Stroke ([place])   . Toxic maculopathy of both eyes     Past Surgical History:  Procedure Laterality Date  . CARDIAC CATHETERIZATION    .  CAROTID ENDARTERECTOMY    . CERVICAL LAMINECTOMY    . CORONARY ANGIOPLASTY    . FRACTURE SURGERY    . fusion C5-6-7    . HERNIA REPAIR    . KNEE ARTHROSCOPY    . TONSILLECTOMY      There were no vitals filed for this visit.  Subjective Assessment - 04/17/19 1627    Subjective   Pt. reports having 7/10 pain in his left hand    Patient is accompanied by:  Family member    Pertinent History  Pt. is a 74 y.o. 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 Pt. PMHx includes: Multiple Falls, Lupus, DJD, Renal Abscess, CVA. Pt. resides with his wife. Pt.'s wife and daughter assist with ADLs. Pt. has caregivers in  for 2 hours a day, 6 days a week. Pt. received Rehab services in acute care, at SNF for STR, and [place] services. Pt. is retired from [employer] for [employer] and [employer].    Patient Stated Goals  To regain use of his left UE, and do more for himself.    Currently in Pain?  Yes    Pain Score  7     Pain  Location  Hand    Pain Orientation  Left    Pain Descriptors / Indicators  Aching    Pain Type  Chronic pain    Pain Radiating Towards  radiating towards left shoulder and neck.      OT Treatment:  Therapeutic Activities:  Pt. worked on using his left hand for grasping, and placing 2" large pegs on the Instructo board placed at a tabletop surface. Pt. Worked on following [employer]  Pt. required cues to follow the design pattern, and place the pegs in the accurate spot on the board. Pt. Worked on further challenging the task with increasing trunk control, and flexing forward at the trunk when placing the pegs onto the board. Pt. Worked on trunk rotation to the right, and left when reaching.  Self care:  Pt. worked on UE dressing tasks using hemi-dressing techniques. Pt.required cues forinitiating, and organizing theposition of the sweatshirtwhenpreparing todonn the zip down sweatshirt. Pt. Initially attempts to donn  it backwards, however is able to self correct the position with cues. Pt. Requiredfewercues to thread the LUE with the right, assist to bring the jacket around his back, and reposition the jacket to prepare for zipping. Pt. Required assist to initiate fastening the zipper.  Pt. has 7/10 pain in his left hand. pt. reports that it radiates to his shoulder, and neck with raising his LUE. Pt. is making progress with UE dressing skills, and continues to require cues, and assist to donn the jacket in the correct direction, bring the jacket around his back, and shoulders, and  manipulate the zipper. Pt. continues to require cues for left sided awareness, trunk control, and for acuuracy with following design patterns. Pt. continues to work on these skills in order to work towards increasing engagement of the LUE during ADLs, and IADL tasks.                 OT Education - 04/17/19 1628    Education Details  Left UE functioning, left sided awareness.    Person(s) Educated  Patient    Methods  Explanation;Demonstration    Comprehension  Returned demonstration;Tactile cues required;Verbalized understanding;Need further instruction          OT Long Term Goals - 04/07/19 1415      OT LONG TERM GOAL #1   Title  Pt. will increase left shoulder flexion AROM by 10 degrees to access cabinet/shelf.    Baseline  Shoulder flexion improved to 125. Pt. continues to work on reaching with increasing emphasis on flexing trunk to improve functional reach.    Time  12    Period  Weeks    Status  On-going    Target Date  06/30/19      OT LONG TERM GOAL #2   Title  Pt. will donn a shirt with Supervision.    Baseline  MinA donning a zip down jacket with consistent cues to initiate the correct direction of the jacket.    Time  12    Period  Weeks    Status  On-going    Target Date  06/30/19      OT LONG TERM GOAL #4   Title  Pt. will improve left grip strength by 10# to be able to open a  jar/container.    Baseline  Pt. continues to progress with grip strength, and continues to work towards opening containers, and bottles.    Time  12    Period  Weeks  Status  On-going    Target Date  06/30/19      OT LONG TERM GOAL #5   Title  Pt. will improve left hand [facility] skills to be able to assist with buttoning/zipping.    Baseline  Pt. is improving with manipulating the zipper on his jacket, however continues to require practice. Pt. continues to work on improving bilateral [facility] skills for buttoning/zipping    Time  12    Period  Weeks    Status  On-going    Target Date  06/30/19      OT LONG TERM GOAL #6   Title  Pt. will demonstrate visual conmpensatory strategies during 100% of the time during ADLs    Baseline  Pt. requires cues at times for left sides awareness.    Time  12    Period  Weeks    Status  On-going    Target Date  06/30/19      OT LONG TERM GOAL #7   Title  Pt. will prepare a simple cold snack from w/c with supervision using cognitive compensatory strategies 100% of the time.    Baseline  Pt. has difficulty with getting the w/c close enough to access the refrigerator      OT LONG TERM GOAL #8   Title  Pt. will accurately identify potential safety hazard using good safety awareness, and judgement 100% for ADLs, and IADLs.    Baseline  Pt. requires cues    Time  12    Period  Weeks    Status  On-going    Target Date  06/30/19      OT LONG TERM GOAL  #9   TITLE  Pt. will  navigate his w/c around obstacles with Supervision and 100% accuracy    Baseline  Pt. continues to require cuing for obstacle on the left, however requires less cues.    Time  12    Period  Weeks    Status  On-going    Target Date  04/30/19      OT LONG TERM GOAL  #11   TITLE  Pt. will increase BUE strength by 58mm grades in preparation for ADL transfers.    Baseline  Pt. continues to present with limited BUE strength    Time  12    Period  Weeks    Status  On-going    Target Date   06/30/19            Plan - 04/17/19 1629    Clinical Impression Statement  Pt. has 7/10 pain in his left hand. pt. reports that it radiates to his shoulder, and neck with raising his LUE. Pt. is making progress with UE dressing skills, and continues to require cues, and assist to donn the jacket in the correct direction, bring the jacket around his back, and shoulders, and  manipulate the zipper. Pt. continues to require cues for left sided awareness, trunk control, and for acuuracy with following design patterns. Pt. continues to work on these skills in order to work towards increasing engagement of the LUE during ADLs, and IADL tasks.    OT Occupational Profile and History  Problem Focused Assessment - Including review of records relating to presenting problem    Occupational performance deficits (Please refer to evaluation for details):  ADL's    Body Structure / Function / Physical Skills  UE functional use;Coordination;FMC;Dexterity;Strength;ROM    Cognitive Skills  Attention;Memory;Emotional;Problem Solve;Safety Awareness    Comorbidities Affecting Occupational Performance:  Presence of comorbidities impacting occupational performance    Comorbidities impacting occupational performance description:  Phyical, cognitive, visual,  medical comorbidities    Modification or Assistance to Complete Evaluation   No modification of tasks or assist necessary to complete eval    OT Frequency  2x / week    OT Duration  12 weeks    OT Treatment/Interventions  Self-care/ADL training;DME and/or AE instruction;Therapeutic exercise;Therapeutic activities;Moist Heat;Cognitive remediation/compensation;Neuromuscular education;Visual/perceptual remediation/compensation;Coping strategies training;Patient/family education;Passive range of motion;Psychosocial skills training;Energy conservation;Functional Mobility Training    Recommended Other Services  PT    Consulted and Agree with Plan of Care  Patient     Family Member Consulted  Daughter [name]       Patient will benefit from skilled therapeutic intervention in order to improve the following deficits and impairments:   Body Structure / Function / Physical Skills: UE functional use, Coordination, FMC, Dexterity, Strength, ROM Cognitive Skills: Attention, Memory, Emotional, Problem Solve, Safety Awareness     Visit Diagnosis: Muscle weakness (generalized)  Other lack of coordination    Problem List Patient Active Problem List   Diagnosis Date Noted  . Sepsis ([place]) 10/18/2018    Harrel Carina, MS, OTR/L 04/17/2019, 4:36 PM  [place] MAIN [facility] SERVICES [address] [place], [place], [address] Phone: [phone]   Fax:  [phone]  Name: Kenneth Summers MRN: [id number] Date of Birth: Dec 04, 1945

## 2019-04-17 NOTE — Therapy (Signed)
[place] MAIN [facility] SERVICES [address] [place], [place], [address] Phone: [phone]   Fax:  [phone]  Physical Therapy Treatment  Patient Details  Name: Kenneth Summers MRN: [id number] Date of Birth: 1945-12-06 Referring Provider (PT): Dr. [name], Guy Begin   Encounter Date: 04/17/2019  PT End of Session - 04/17/19 1354    Visit Number  111    Number of Visits  144    Date for PT Re-Evaluation  05/23/19    Authorization Type  Medicare reporting period starting 09/11/18    Authorization Time Period  goals last assessed 03/19/19    PT Start Time  1347    PT Stop Time  1430    PT Time Calculation (min)  43 min    Equipment Utilized During Treatment  Gait belt    Activity Tolerance  Patient tolerated treatment well;No increased pain    Behavior During Therapy  WFL for tasks assessed/performed       Past Medical History:  Diagnosis Date  . Actinic keratosis   . Alcohol abuse   . APS (antiphospholipid syndrome) ([place])   . Basal cell carcinoma   . BPH (benign prostatic hyperplasia)   . CAD (coronary artery disease)   . Cellulitis   . Cervical spinal stenosis   . Chronic osteomyelitis ([place])   . CKD (chronic kidney disease)   . CKD (chronic kidney disease)   . Clostridium difficile diarrhea   . Depression   . Diplopia   . Fatigue   . GERD (gastroesophageal reflux disease)   . Hyperlipemia   . Hypertension   . Hypovitaminosis D   . IBS (irritable bowel syndrome)   . 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 Insomnia   . Left lumbosacral radiculopathy   . Moderate obstructive sleep apnea   . Osteomyelitis of foot ([place])   . PAD (peripheral artery disease) ([place])   . Pruritus   . RA (rheumatoid arthritis) ([place])   . Radiculopathy   . Restless legs syndrome   . RLS (restless legs syndrome)   . SLE (systemic lupus erythematosus related syndrome) ([place])   . 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 Stroke  ([place])   . Toxic maculopathy of both eyes     Past Surgical History:  Procedure Laterality Date  . CARDIAC CATHETERIZATION    . CAROTID ENDARTERECTOMY    . CERVICAL LAMINECTOMY    . CORONARY ANGIOPLASTY    . FRACTURE SURGERY    . fusion C5-6-7    . HERNIA REPAIR    . KNEE ARTHROSCOPY    . TONSILLECTOMY      There were no vitals filed for this visit.  Subjective Assessment - 04/17/19 1350    Subjective  Patient reports continued soreness in LUE especially in hand; Reports adherence with HEP    Patient is accompained by:  Family member    Pertinent History  Patient is a 74 y.o. male who presents to outpatient physical therapy with a referral for medical diagnosis of CVA. This patient's chief complaints consist of left hemiplegia and overall deconditioning leading to the following functional deficits: dependent for ADLs, IADLs, unable to transfer to car, dependent transfers at home with hoyer lift, unable to walk or stand without significant assistance, difficulty with W/C navigation..    Currently in Pain?  Yes    Pain Score  2     Pain Location  Arm  Pain Orientation  Left    Pain Descriptors / Indicators  Aching;Sore    Pain Type  Acute pain    Pain Radiating Towards  radiates down UE to hand- hand is 8/10    Pain Onset  In the past 7 days    Pain Frequency  Intermittent    Aggravating Factors   worse with looking up/cervical extension    Pain Relieving Factors  rest/heat    Effect of Pain on Daily Activities  decreased activity tolerance;         TREATMENT: Patient transferred stand pivot from wheelchair to mat table, mod A +1 with mod VCs for forward weight shift and to increase push through LE;  Patient then required mod A for transitioning sit to supine with assistance to lift LE onto mat table;  Patient reported increased UE discomfort, PT applied moist heat to cervical paraspinals in supine concurrent with exercise;   Hooklying: Lumbar trunk rotation x5 reps  with min A for increased ROM for better stretch; He does report increased pain with rotation to left side;  Bridges with arms by side x10 reps; Bridges with right leg crossed over left x10 reps for LLE single leg bridge Instructed patient in manual resisted hip flexion/extension with therapist resisted x4 reps with increased pain reported, discontinued exercise;   Patient required mod A for transition to sidelying: PT performed passive LLE quad stretch 20 sec hold x2 reps; PT instructed patient in clamshells LLE x5 reps, partial ROM;   Patient required mod A +1 for transition sidelying to sitting edge of mat;  He then required mod A +2 for stand pivot transfer back to wheelchair with mod VCS for hand placement/foot placement and positioning;  Response to treatment: Tolerated fair. Patient does report increased LUE discomfort throughout session, especially with straining of using LLE. He required increased assistance for bed mobility throughout session. He is heavily fatigued this session. Patient required cues and assistance for proper positioning with strengthening exercise. He would benefit from additional skilled PT intervention to improve strength and mobility;                     PT Education - 04/17/19 1351    Education Details  transfers/positioning, HEP    Person(s) Educated  Patient    Methods  Explanation;Verbal cues    Comprehension  Verbalized understanding;Returned demonstration;Verbal cues required;Need further instruction       PT Short Term Goals - 04/17/19 0902      PT SHORT TERM GOAL #1   Title  Be independent with initial home exercise program for self-management of symptoms.    Baseline  HEP to be given at second session; advice to practice unsupported sitting at home (06/19/2018); 07/08/18: Performing at home, 6/29: needs assistance but is doing exercise program regularly; 08/22/18: adherent;    Time  2    Period  Weeks    Status  Achieved    Target  Date  06/26/18        PT Long Term Goals - 04/17/19 0902      PT LONG TERM GOAL #1   Title  Patient will complete all bed mobility with min A to improve functional independence for getting in and out of bed and adjusting in bed.     Baseline  max A - total A reported by family (06/19/2018); 07/08/18: still requires maxA, dtr reports improvement in rolling since starting therapy; 6/23: min A to supervision in clinic; not doing  at home right; 8/5: Pt's daughter states that his rolling has improved, but still has difficulty with all other bed mobility; 10/23/18: pts daughter states that she is doing about 35% of the work if he is in proper position, he does uses the bed rails to help roll;  using Harrel Lemon for coming to sit EOB; 10/12: pts daughter states that she is doing about 30% of the work if he is in proper position, he does uses the bed rails to help roll, but it is improving;  using Hoyer for coming to sit EOB, 10/29: min A for sit to sidelying, rolling supervision, able to transition sidelying to sit with HHA;    Time  12    Period  Weeks    Status  Achieved    Target Date  12/04/18      PT LONG TERM GOAL #2   Title  Patient will complete sit <> stand transfer chair to chair with Min A and LRAD to improve functional independence for household and community mobility.     Baseline  variable min-modA for STS, still requires elevated surface height to 27"    Time  12    Period  Weeks    Status  Partially Met    Target Date  05/23/19      PT LONG TERM GOAL #3   Title  Patient will navigate power w/c with min A x 100 feet to improve mobility for household and short community distances.    Baseline  required total A (06/12/2018); able to roll 20 feet with min A (06/20/2018); 07/08/18: Pt can go approximately 10' without assist per family;  10/23/2018: pt's daughter states that his power WC mobility has become 100% better with the new hand control, slowed speed, and with practice; 10/12: pt's daughter  reports that he can perform household WC mobility and limited community ambulation with minA    Time  12    Period  Weeks    Status  Achieved    Target Date  05/23/19      PT LONG TERM GOAL #4   Title  Patient will complete car transfers W/C to family SUV with min A using LRAD to improve his abilty to participate in community activities.     Baseline  unable to complete car transfers at all (06/12/2018); unable to complete car transfers at all (06/19/2018).; 07/08/18: Unable to perform at this time; 09/11/18: unable to perform at this time; 10/23/2018: unable to complete car transfers at all; 10/12:  unable to complete car transfers at all, 10/29: unable; 01/13/19: unable; 2/11: min-mod A +2, 3/10: min A+1    Time  12    Period  Weeks    Status  Achieved    Target Date  05/23/19      PT LONG TERM GOAL #5   Title  Patient will ambulate at least 10 feet with min A using LRAD to improve mobility for household and community distances.    Baseline  unable to take steps (06/12/2018); able to shift R leg forward and back with max A and RW at edge of plinth (06/19/2018); 07/08/18: unable to ambulate at this time. 09/11/18: unable to perform at this time; 10/23/2018: unable to perform at this time; 10/12: unable to perform at this time, 10/29: unable at this time; 01/13/19: unable at this time, 2/11: min A to step with RLE, unable to step with LLE, 3/10: able to take 2 steps in parallel bars with min A for  safety;    Time  12    Period  Weeks    Status  Partially Met    Target Date  05/23/19      PT LONG TERM GOAL #6   Title  Patient will be able to safety navigate up/down ramp with power chair, exhibiting good safety awareness, mod I to safely enter/exit his home.    Baseline  2/11: supervision with intermittent min A    Time  12    Period  Weeks    Status  On-going    Target Date  05/23/19      PT LONG TERM GOAL #7   Title  Patient will increase functional reach test to >15 inches in sitting to exhibit  improved sitting balance and positioning and reduce fall risk;    Baseline  07/30/18: 12 inches; 09/11/18: 10-12 inches; 10/23/18: 12-13 inch; 10/12: 14.5", 10/29: 15.5 inch    Time  4    Period  Weeks    Status  Achieved    Target Date  05/23/19      PT LONG TERM GOAL #8   Title  Patient will increase LLE quad strength to 3+/5 to improve functional strength for standing and mobility;    Baseline  10/28: 2+/5; 01/13/19: 2+/5, 2/11: 3-/5, 04/16/19: 3-/5    Time  12    Period  Weeks    Status  Partially Met    Target Date  05/23/19            Plan - 04/17/19 1429    Clinical Impression Statement  Patient does report increased LUE discomfort throughout session, especially with straining of using LLE. He required increased assistance for bed mobility throughout session. He is heavily fatigued this session. Patient required cues and assistance for proper positioning with strengthening exercise. 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    Rehab Potential  Fair    PT Frequency  3x / week    PT Duration  12 weeks    PT Treatment/Interventions  ADLs/Self Care Home Management;Electrical Stimulation;Moist Heat;Cryotherapy;Gait training;Stair training;Functional mobility training;Therapeutic exercise;Balance training;Neuromuscular re-education;Cognitive remediation;Patient/family education;Orthotic Fit/Training;Wheelchair mobility  training;Manual techniques;Passive range of motion;Energy conservation;Joint Manipulations    PT Next Visit Plan  Work on strength, sitting/standing balance, functional activities such as rolling, bed mobility, transfers; caregiver training    PT Home Exercise Plan  no updates this session    Consulted and Agree with Plan of Care  Patient;Family member/caregiver    Family Member Consulted  daughter [name]       Patient will benefit from skilled therapeutic intervention in order to improve the following deficits and impairments:  Abnormal gait, Decreased activity tolerance, Decreased cognition, Decreased endurance, Decreased knowledge of use of DME, Decreased range of motion, Decreased skin integrity, Decreased strength, Impaired perceived functional ability, Impaired sensation, Impaired UE functional use, Improper body mechanics, Pain, Cardiopulmonary status limiting activity, Decreased balance, Decreased coordination, Decreased mobility, Difficulty walking, Impaired tone, Postural dysfunction  Visit Diagnosis: Muscle weakness (generalized)  Other lack of coordination  Difficulty in walking, not elsewhere classified  Repeated falls     Problem List Patient Active Problem List   Diagnosis Date Noted  . Sepsis ([place]) 10/18/2018    Aayansh Codispoti PT, DPT 04/17/2019, 2:30 PM  [place] MAIN [facility] SERVICES [address] [place], [place], [address] Phone: [phone]   Fax:  [phone]  Name: Kenneth Summers MRN: [id number] Date of Birth: October 16, 1945

## 2019-04-21 ENCOUNTER — Encounter: Payer: Self-pay | Admitting: Physical Therapy

## 2019-04-21 ENCOUNTER — Other Ambulatory Visit: Payer: Self-pay

## 2019-04-21 ENCOUNTER — Ambulatory Visit: Payer: Medicare Other | Admitting: Occupational Therapy

## 2019-04-21 ENCOUNTER — Ambulatory Visit: Payer: Medicare Other | Admitting: Physical Therapy

## 2019-04-21 ENCOUNTER — Encounter: Payer: Self-pay | Admitting: Occupational Therapy

## 2019-04-21 DIAGNOSIS — R262 Difficulty in walking, not elsewhere classified: Secondary | ICD-10-CM

## 2019-04-21 DIAGNOSIS — R296 Repeated falls: Secondary | ICD-10-CM

## 2019-04-21 DIAGNOSIS — R278 Other lack of coordination: Secondary | ICD-10-CM

## 2019-04-21 DIAGNOSIS — M6281 Muscle weakness (generalized): Secondary | ICD-10-CM

## 2019-04-21 NOTE — Therapy (Signed)
[place] MAIN [facility] SERVICES [address] [place], [place], [address] Phone: [phone]   Fax:  [phone]  Physical Therapy Treatment  Patient Details  Name: Kenneth Summers MRN: [id number] Date of Birth: 01-13-1946 Referring Provider (PT): Dr. [name], Guy Begin   Encounter Date: 04/21/2019  PT End of Session - 04/21/19 1320    Visit Number  [id number]    Number of Visits  144    Date for PT Re-Evaluation  05/23/19    Authorization Type  Medicare reporting period starting 09/11/18    Authorization Time Period  goals last assessed 03/19/19    PT Start Time  1302    PT Stop Time  1345    PT Time Calculation (min)  43 min    Equipment Utilized During Treatment  Gait belt    Activity Tolerance  Patient tolerated treatment well;No increased pain    Behavior During Therapy  WFL for tasks assessed/performed       Past Medical History:  Diagnosis Date  . Actinic keratosis   . Alcohol abuse   . APS (antiphospholipid syndrome) ([place])   . Basal cell carcinoma   . BPH (benign prostatic hyperplasia)   . CAD (coronary artery disease)   . Cellulitis   . Cervical spinal stenosis   . Chronic osteomyelitis ([place])   . CKD (chronic kidney disease)   . CKD (chronic kidney disease)   . Clostridium difficile diarrhea   . Depression   . Diplopia   . Fatigue   . GERD (gastroesophageal reflux disease)   . Hyperlipemia   . Hypertension   . Hypovitaminosis D   . IBS (irritable bowel syndrome)   . 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 Insomnia   . Left lumbosacral radiculopathy   . Moderate obstructive sleep apnea   . Osteomyelitis of foot ([place])   . PAD (peripheral artery disease) ([place])   . Pruritus   . RA (rheumatoid arthritis) ([place])   . Radiculopathy   . Restless legs syndrome   . RLS (restless legs syndrome)   . SLE (systemic lupus erythematosus related syndrome) ([place])   . 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 Stroke  ([place])   . Toxic maculopathy of both eyes     Past Surgical History:  Procedure Laterality Date  . CARDIAC CATHETERIZATION    . CAROTID ENDARTERECTOMY    . CERVICAL LAMINECTOMY    . CORONARY ANGIOPLASTY    . FRACTURE SURGERY    . fusion C5-6-7    . HERNIA REPAIR    . KNEE ARTHROSCOPY    . TONSILLECTOMY      There were no vitals filed for this visit.  Subjective Assessment - 04/21/19 1314    Subjective  Patient reports continued LUE soreness. He and caregiver reports that he had a good weekend and has been doing more at home.    Patient is accompained by:  Family member    Pertinent History  Patient is a 74 y.o. male who presents to outpatient physical therapy with a referral for medical diagnosis of CVA. This patient's chief complaints consist of left hemiplegia and overall deconditioning leading to the following functional deficits: dependent for ADLs, IADLs, unable to transfer to car, dependent transfers at home with hoyer lift, unable to walk or stand without significant assistance, difficulty with W/C navigation..    Currently in Pain?  Yes    Pain Score  2  Pain Location  Arm    Pain Orientation  Left    Pain Descriptors / Indicators  Aching;Sore    Pain Type  Acute pain    Pain Onset  1 to 4 weeks ago    Pain Frequency  Intermittent    Aggravating Factors   worse with cervical extension    Pain Relieving Factors  rest/heat    Effect of Pain on Daily Activities  decreased activity tolerance;    Multiple Pain Sites  No           TREATMENT:  Prior to transfers and in between transfers:  Patient sitting in wheelchair: Instructed patient in LE strengthening exercise: -LAQ x10 reps bilaterally; -finger flicks to relax UE and stretch hands x5 reps; -deep breathing with erect posture x5 reps with cues for proper positioning;  Pt exhibits improved ROM  bilaterally with better motor control and LE positioning;   Following exercise:  Transferred sit<>Stand in parallel  bars (seat adjusted to 27 inch from floor height) x5 reps throughout session;He was able to transfer CGA-supervision with min VCs for forward weight shift for better trunk control and transfer ability;   Standing in parallel bars: -1st rep, held for 1-2 min with standing activities, CGA to close supervision with min VCs for weight shift and increased extension for better stance control;  -Side/side weight shift with CGA,  x10, with B rail assist and cues for neutral positioning;  -Shift to neutral position, alternate UE lift x5 reps bilaterally;  -RLE hip flexion march x10 reps with min A to shift to LLE with therapist blocking LLE knee;  -LLE hip flexion march x 2 Reps with min A for weight shift and erect posture;  -Instructed patient in forward steps in parallel bars, he was able to step forward with right/left foot x1 set with min A for safety;      Patient initially exhibits flexed posture with decreased hip extension; He was able to exhibit improved hip extension during  subsequent transfers; Although he continues to require min A for safety and trunk control;    Patient required min A to scoot back in chair at end of session due to increased fatigue   Response to treatment: Tolerated well. With all transfers patient required min VCs for forward weight shift and to increase hip/knee extension in stance. He was able to exhibit improved AROM with seated exercise and better motor control in standing. He continues to have decreased LLE terminal knee extension in stance which limits stance control.   Patient heavily fatigued at end of session.                      PT Education - 04/21/19 1315    Education Details  transfers/positioning, HEP    Person(s) Educated  Patient    Methods  Explanation;Verbal cues    Comprehension  Verbalized understanding;Need further instruction;Returned demonstration;Verbal cues required       PT Short Term Goals - 04/17/19 0902      PT  SHORT TERM GOAL #1   Title  Be independent with initial home exercise program for self-management of symptoms.    Baseline  HEP to be given at second session; advice to practice unsupported sitting at home (06/19/2018); 07/08/18: Performing at home, 6/29: needs assistance but is doing exercise program regularly; 08/22/18: adherent;    Time  2    Period  Weeks    Status  Achieved    Target Date  06/26/18        PT Long Term Goals - 04/17/19 0902      PT LONG TERM GOAL #1   Title  Patient will complete all bed mobility with min A to improve functional independence for getting in and out of bed and adjusting in bed.     Baseline  max A - total A reported by family (06/19/2018); 07/08/18: still requires maxA, dtr reports improvement in rolling since starting therapy; 6/23: min A to supervision in clinic; not doing at home right; 8/5: Pt's daughter states that his rolling has improved, but still has difficulty with all other bed mobility; 10/23/18: pts daughter states that she is doing about 35% of the work if he is in proper position, he does uses the bed rails to help roll;  using Hoyer for coming to sit EOB; 10/12: pts daughter states that she is doing about 30% of the work if he is in proper position, he does uses the bed rails to help roll, but it is improving;  using Hoyer for coming to sit EOB, 10/29: min A for sit to sidelying, rolling supervision, able to transition sidelying to sit with HHA;    Time  12    Period  Weeks    Status  Achieved    Target Date  12/04/18      PT LONG TERM GOAL #2   Title  Patient will complete sit <> stand transfer chair to chair with Min A and LRAD to improve functional independence for household and community mobility.     Baseline  variable min-modA for STS, still requires elevated surface height to 27"    Time  12    Period  Weeks    Status  Partially Met    Target Date  05/23/19      PT LONG TERM GOAL #3   Title  Patient will navigate power w/c with min A x  100 feet to improve mobility for household and short community distances.    Baseline  required total A (06/12/2018); able to roll 20 feet with min A (06/20/2018); 07/08/18: Pt can go approximately 10' without assist per family;  10/23/2018: pt's daughter states that his power WC mobility has become 100% better with the new hand control, slowed speed, and with practice; 10/12: pt's daughter reports that he can perform household WC mobility and limited community ambulation with minA    Time  12    Period  Weeks    Status  Achieved    Target Date  05/23/19      PT LONG TERM GOAL #4   Title  Patient will complete car transfers W/C to family SUV with min A using LRAD to improve his abilty to participate in community activities.     Baseline  unable to complete car transfers at all (06/12/2018); unable to complete car transfers at all (06/19/2018).; 07/08/18: Unable to perform at this time; 09/11/18: unable to perform at this time; 10/23/2018: unable to complete car transfers at all; 10/12:  unable to complete car transfers at all, 10/29: unable; 01/13/19: unable; 2/11: min-mod A +2, 3/10: min A+1    Time  12    Period  Weeks    Status  Achieved    Target Date  05/23/19      PT LONG TERM GOAL #5   Title  Patient will ambulate at least 10 feet with min A using LRAD to improve mobility for household and community distances.  Baseline  unable to take steps (06/12/2018); able to shift R leg forward and back with max A and RW at edge of plinth (06/19/2018); 07/08/18: unable to ambulate at this time. 09/11/18: unable to perform at this time; 10/23/2018: unable to perform at this time; 10/12: unable to perform at this time, 10/29: unable at this time; 01/13/19: unable at this time, 2/11: min A to step with RLE, unable to step with LLE, 3/10: able to take 2 steps in parallel bars with min A for safety;    Time  12    Period  Weeks    Status  Partially Met    Target Date  05/23/19      PT LONG TERM GOAL #6   Title  Patient will  be able to safety navigate up/down ramp with power chair, exhibiting good safety awareness, mod I to safely enter/exit his home.    Baseline  2/11: supervision with intermittent min A    Time  12    Period  Weeks    Status  On-going    Target Date  05/23/19      PT LONG TERM GOAL #7   Title  Patient will increase functional reach test to >15 inches in sitting to exhibit improved sitting balance and positioning and reduce fall risk;    Baseline  07/30/18: 12 inches; 09/11/18: 10-12 inches; 10/23/18: 12-13 inch; 10/12: 14.5", 10/29: 15.5 inch    Time  4    Period  Weeks    Status  Achieved    Target Date  05/23/19      PT LONG TERM GOAL #8   Title  Patient will increase LLE quad strength to 3+/5 to improve functional strength for standing and mobility;    Baseline  10/28: 2+/5; 01/13/19: 2+/5, 2/11: 3-/5, 04/16/19: 3-/5    Time  12    Period  Weeks    Status  Partially Met    Target Date  05/23/19            Plan - 04/21/19 1530    Clinical Impression Statement  Patient motivated and participated well within session. He was able to progress in weight shift in standing being able to find neutral position easier this session and exhibits better control. Patient does fatigue quickly requiring short sitting rest breaks. Instructed patient in LE strengthening and breathing exercise during rest for better sitting position. He does require visual cues to increase AROM especially on LLE for better strengthening. He would benefit from additional skilled PT Intervention to improve strength, balance and mobility;    Comorbidities  Relevant past medical history and comorbidities include long term steroid use for lupus, CKD, chronic osteomyelitis, cervical spine stenosis, BPH, APS, alcohol abuse, peripheral artery disease, depression, GERD, obstructive sleep apnea, HTN, IBS, IgA deficiency, lumbar radiculopathy, RA, Stroke, toxic maculopathy in both eyes, systemic lupus erythematosus related syndrome,  cardiac catheterization, carotid endarterectomy, cervical laminectomy, coronary angioplasty, Fusion C5-C7, knee arthroplasty, lumbar surgery.    Rehab Potential  Fair    PT Frequency  3x / week    PT Duration  12 weeks    PT Treatment/Interventions  ADLs/Self Care Home Management;Electrical Stimulation;Moist Heat;Cryotherapy;Gait training;Stair training;Functional mobility training;Therapeutic exercise;Balance training;Neuromuscular re-education;Cognitive remediation;Patient/family education;Orthotic Fit/Training;Wheelchair mobility training;Manual techniques;Passive range of motion;Energy conservation;Joint Manipulations    PT Next Visit Plan  Work on strength, sitting/standing balance, functional activities such as rolling, bed mobility, transfers; caregiver training    PT Home Exercise Plan  no updates this session  Consulted and Agree with Plan of Care  Patient;Family member/caregiver    Family Member Consulted  daughter [name]       Patient will benefit from skilled therapeutic intervention in order to improve the following deficits and impairments:  Abnormal gait, Decreased activity tolerance, Decreased cognition, Decreased endurance, Decreased knowledge of use of DME, Decreased range of motion, Decreased skin integrity, Decreased strength, Impaired perceived functional ability, Impaired sensation, Impaired UE functional use, Improper body mechanics, Pain, Cardiopulmonary status limiting activity, Decreased balance, Decreased coordination, Decreased mobility, Difficulty walking, Impaired tone, Postural dysfunction  Visit Diagnosis: Muscle weakness (generalized)  Other lack of coordination  Difficulty in walking, not elsewhere classified  Repeated falls     Problem List Patient Active Problem List   Diagnosis Date Noted  . Sepsis ([place]) 10/18/2018    Natania Finigan PT, DPT 04/21/2019, 3:31 PM  [place] MAIN [facility] SERVICES [address] [place], [place], [address] Phone: [phone]   Fax:  [phone]  Name: Kenneth Summers MRN: [id number] Date of Birth: 09-Sep-1945

## 2019-04-21 NOTE — Therapy (Signed)
[place] MAIN [facility] SERVICES [address] [place], [place], [address] Phone: [phone]   Fax:  [phone]  Occupational Therapy Treatment  Patient Details  Name: Kenneth Summers MRN: [id number] Date of Birth: 1945-02-18 No data recorded  Encounter Date: 04/21/2019  OT End of Session - 04/21/19 2248    Visit Number  97    Number of Visits  133    Date for OT Re-Evaluation  06/30/19    Authorization Type  Progress report period starting 12/26/2018    OT Start Time  1348    OT Stop Time  1430    OT Time Calculation (min)  42 min    Activity Tolerance  Patient tolerated treatment well;Patient limited by fatigue    Behavior During Therapy  [facility] for tasks assessed/performed       Past Medical History:  Diagnosis Date  . Actinic keratosis   . Alcohol abuse   . APS (antiphospholipid syndrome) ([place])   . Basal cell carcinoma   . BPH (benign prostatic hyperplasia)   . CAD (coronary artery disease)   . Cellulitis   . Cervical spinal stenosis   . Chronic osteomyelitis ([place])   . CKD (chronic kidney disease)   . CKD (chronic kidney disease)   . Clostridium difficile diarrhea   . Depression   . Diplopia   . Fatigue   . GERD (gastroesophageal reflux disease)   . Hyperlipemia   . Hypertension   . Hypovitaminosis D   . IBS (irritable bowel syndrome)   . 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 Insomnia   . Left lumbosacral radiculopathy   . Moderate obstructive sleep apnea   . Osteomyelitis of foot ([place])   . PAD (peripheral artery disease) ([place])   . Pruritus   . RA (rheumatoid arthritis) ([place])   . Radiculopathy   . Restless legs syndrome   . RLS (restless legs syndrome)   . SLE (systemic lupus erythematosus related syndrome) ([place])   . 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 Stroke ([place])   . Toxic maculopathy of both eyes     Past Surgical History:  Procedure Laterality Date  . CARDIAC CATHETERIZATION    .  CAROTID ENDARTERECTOMY    . CERVICAL LAMINECTOMY    . CORONARY ANGIOPLASTY    . FRACTURE SURGERY    . fusion C5-6-7    . HERNIA REPAIR    . KNEE ARTHROSCOPY    . TONSILLECTOMY      There were no vitals filed for this visit.  Subjective Assessment - 04/21/19 1650    Subjective   Pt. reports that he was able to get outside for some fresh air this weekend.    Patient is accompanied by:  Family member    Pertinent History  Pt. is a 74 y.o. 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 Pt. PMHx includes: Multiple Falls, Lupus, DJD, Renal Abscess, CVA. Pt. resides with his wife. Pt.'s wife and daughter assist with ADLs. Pt. has caregivers in  for 2 hours a day, 6 days a week. Pt. received Rehab services in acute care, at SNF for STR, and [place] services. Pt. is retired from [employer] for [employer] and [employer].    Patient Stated Goals  To regain use of his left UE, and do more for himself.    Currently in Pain?  Yes    Pain Score  4     Pain Location  Hand   radiating from the neck when moving his head back.   Pain Orientation  Left    Pain Descriptors / Indicators  Aching;Sore   Electricity   Pain Type  Acute pain    Pain Onset  1 to 4 weeks ago       OT Treatment:  Therapeutic Activities:  Pt. worked onusing his left hand forgrasping,and placing 2"large pegs on the [employer] placed at a tabletop surface. Pt. Worked on following [employer]  Pt. required cues to follow the design pattern, and place the pegs in the accurate spot on the board. Pt. Worked on further challenging the task with increasing trunk control, and flexing forward at the trunk when placing the pegs onto the board.   Self care:  Pt. worked on UE dressing tasks using hemi-dressing techniques. Pt.required cues forinitiating, and organizing theposition of the sweatshirtwhenpreparing todonn the zip down sweatshirt. Pt. Initially attempts to donn it  backwards, however is able to self correct the position with cues.Pt. Requiredfewercues to thread the LUE with the right, assist to bring the jacket around his back, and reposition the jacket to prepare for zipping. Pt. Required assist to initiate fastening the zipper.  Manual Therapy:  Pt. Tolerated retrograde massage for edema control in the left hand Manual therapy.  Pt. has 7/10 pain in his left hand. Pt. reports pain in his hand feels like electricity in his hand, and radiates to his shoulder when moving his neck. Pt. is making progress with UE dressing skills, and continues to require cues, and assist to donn the jacket in the correct direction, bring the jacket around his back, and shoulders, and  manipulate the zipper. Pt. continues to require cues for left sided awareness, trunk control, and for acuuracy with following design patterns. Pt. continues to work on these skills in order to work towards increasing engagement of the LUE during ADLs, and IADL tasks.                      OT Education - 04/21/19 2248    Education Details  Left UE functioning, left sided awareness.    Person(s) Educated  Patient    Methods  Explanation;Demonstration    Comprehension  Returned demonstration;Tactile cues required;Verbalized understanding;Need further instruction          OT Long Term Goals - 04/07/19 1415      OT LONG TERM GOAL #1   Title  Pt. will increase left shoulder flexion AROM by 10 degrees to access cabinet/shelf.    Baseline  Shoulder flexion improved to 125. Pt. continues to work on reaching with increasing emphasis on flexing trunk to improve functional reach.    Time  12    Period  Weeks    Status  On-going    Target Date  06/30/19      OT LONG TERM GOAL #2   Title  Pt. will donn a shirt with Supervision.    Baseline  MinA donning a zip down jacket with consistent cues to initiate the correct direction of the jacket.    Time  12    Period  Weeks     Status  On-going    Target Date  06/30/19      OT LONG TERM GOAL #4   Title  Pt. will improve left grip strength by 10# to be able to open a jar/container.    Baseline  Pt. continues to progress with grip strength, and continues to work towards opening containers, and bottles.    Time  12    Period  Weeks    Status  On-going    Target Date  06/30/19      OT LONG TERM GOAL #5   Title  Pt. will improve left hand [facility] skills to be able to assist with buttoning/zipping.    Baseline  Pt. is improving with manipulating the zipper on his jacket, however continues to require practice. Pt. continues to work on improving bilateral [facility] skills for buttoning/zipping    Time  12    Period  Weeks    Status  On-going    Target Date  06/30/19      OT LONG TERM GOAL #6   Title  Pt. will demonstrate visual conmpensatory strategies during 100% of the time during ADLs    Baseline  Pt. requires cues at times for left sides awareness.    Time  12    Period  Weeks    Status  On-going    Target Date  06/30/19      OT LONG TERM GOAL #7   Title  Pt. will prepare a simple cold snack from w/c with supervision using cognitive compensatory strategies 100% of the time.    Baseline  Pt. has difficulty with getting the w/c close enough to access the refrigerator      OT LONG TERM GOAL #8   Title  Pt. will accurately identify potential safety hazard using good safety awareness, and judgement 100% for ADLs, and IADLs.    Baseline  Pt. requires cues    Time  12    Period  Weeks    Status  On-going    Target Date  06/30/19      OT LONG TERM GOAL  #9   TITLE  Pt. will  navigate his w/c around obstacles with Supervision and 100% accuracy    Baseline  Pt. continues to require cuing for obstacle on the left, however requires less cues.    Time  12    Period  Weeks    Status  On-going    Target Date  04/30/19      OT LONG TERM GOAL  #11   TITLE  Pt. will increase BUE strength by 53mm grades in preparation for ADL  transfers.    Baseline  Pt. continues to present with limited BUE strength    Time  12    Period  Weeks    Status  On-going    Target Date  06/30/19            Plan - 04/21/19 2249    Clinical Impression Statement Pt. has 7/10 pain in his left hand. Pt. reports pain in his hand feels like electricity in his hand, and radiates to his shoulder when moving his neck. Pt. is making progress with UE dressing skills, and continues to require cues, and assist to donn the jacket in the correct direction, bring the jacket around his back, and shoulders, and  manipulate the zipper. Pt. continues to require cues for left sided awareness, trunk control, and for acuuracy with following design patterns. Pt. continues to work on these skills in order to work towards increasing engagement of the LUE during ADLs, and IADL tasks.    OT Occupational Profile and History  Problem Focused Assessment - Including review of records relating to presenting problem    Occupational performance deficits (Please  refer to evaluation for details):  ADL's    Body Structure / Function / Physical Skills  UE functional use;Coordination;FMC;Dexterity;Strength;ROM    Cognitive Skills  Attention;Memory;Emotional;Problem Solve;Safety Awareness    Rehab Potential  Good    Clinical Decision Making  Several treatment options, min-mod task modification necessary    Comorbidities Affecting Occupational Performance:  Presence of comorbidities impacting occupational performance    Comorbidities impacting occupational performance description:  Phyical, cognitive, visual,  medical comorbidities    Modification or Assistance to Complete Evaluation   No modification of tasks or assist necessary to complete eval    OT Frequency  2x / week    OT Duration  12 weeks    OT Treatment/Interventions  Self-care/ADL training;DME and/or AE instruction;Therapeutic exercise;Therapeutic activities;Moist Heat;Cognitive  remediation/compensation;Neuromuscular education;Visual/perceptual remediation/compensation;Coping strategies training;Patient/family education;Passive range of motion;Psychosocial skills training;Energy conservation;Functional Mobility Training    Consulted and Agree with Plan of Care  Patient    Family Member Consulted  Daughter [name]       Patient will benefit from skilled therapeutic intervention in order to improve the following deficits and impairments:   Body Structure / Function / Physical Skills: UE functional use, Coordination, FMC, Dexterity, Strength, ROM Cognitive Skills: Attention, Memory, Emotional, Problem Solve, Safety Awareness     Visit Diagnosis: Muscle weakness (generalized)    Problem List Patient Active Problem List   Diagnosis Date Noted  . Sepsis ([place]) 10/18/2018    Harrel Carina, MS, OTR/L 04/21/2019, 10:53 PM  [place] MAIN [facility] SERVICES [address] [place], [place], [address] Phone: [phone]   Fax:  [phone]  Name: Kenneth Summers MRN: [id number] Date of Birth: 06-21-1945

## 2019-04-23 ENCOUNTER — Encounter: Payer: Self-pay | Admitting: Physical Therapy

## 2019-04-23 ENCOUNTER — Other Ambulatory Visit: Payer: Self-pay

## 2019-04-23 ENCOUNTER — Ambulatory Visit: Payer: Medicare Other | Admitting: Physical Therapy

## 2019-04-23 DIAGNOSIS — R296 Repeated falls: Secondary | ICD-10-CM

## 2019-04-23 DIAGNOSIS — M6281 Muscle weakness (generalized): Secondary | ICD-10-CM

## 2019-04-23 DIAGNOSIS — R278 Other lack of coordination: Secondary | ICD-10-CM

## 2019-04-23 DIAGNOSIS — R262 Difficulty in walking, not elsewhere classified: Secondary | ICD-10-CM

## 2019-04-23 NOTE — Therapy (Signed)
[place] MAIN [facility] SERVICES [address] [place], [place], [address] Phone: [phone]   Fax:  [phone]  Physical Therapy Treatment  Patient Details  Name: Kenneth Summers MRN: [id number] Date of Birth: [birth year] Referring Provider (PT): Dr. [name], Guy Begin   Encounter Date: 04/23/2019  PT End of Session - 04/23/19 1314    Visit Number  [id number]    Number of Visits  144    Date for PT Re-Evaluation  05/23/19    Authorization Type  Medicare reporting period starting 09/11/18    Authorization Time Period  goals last assessed 03/19/19    PT Start Time  1302    PT Stop Time  1345    PT Time Calculation (min)  43 min    Equipment Utilized During Treatment  Gait belt    Activity Tolerance  Patient tolerated treatment well;No increased pain    Behavior During Therapy  WFL for tasks assessed/performed       Past Medical History:  Diagnosis Date  . Actinic keratosis   . Alcohol abuse   . APS (antiphospholipid syndrome) ([place])   . Basal cell carcinoma   . BPH (benign prostatic hyperplasia)   . CAD (coronary artery disease)   . Cellulitis   . Cervical spinal stenosis   . Chronic osteomyelitis ([place])   . CKD (chronic kidney disease)   . CKD (chronic kidney disease)   . Clostridium difficile diarrhea   . Depression   . Diplopia   . Fatigue   . GERD (gastroesophageal reflux disease)   . Hyperlipemia   . Hypertension   . Hypovitaminosis D   . IBS (irritable bowel syndrome)   . 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 Insomnia   . Left lumbosacral radiculopathy   . Moderate obstructive sleep apnea   . Osteomyelitis of foot ([place])   . PAD (peripheral artery disease) ([place])   . Pruritus   . RA (rheumatoid arthritis) ([place])   . Radiculopathy   . Restless legs syndrome   . RLS (restless legs syndrome)   . SLE (systemic lupus erythematosus related syndrome) ([place])   . 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 Stroke  ([place])   . Toxic maculopathy of both eyes     Past Surgical History:  Procedure Laterality Date  . CARDIAC CATHETERIZATION    . CAROTID ENDARTERECTOMY    . CERVICAL LAMINECTOMY    . CORONARY ANGIOPLASTY    . FRACTURE SURGERY    . fusion C5-6-7    . HERNIA REPAIR    . KNEE ARTHROSCOPY    . TONSILLECTOMY      There were no vitals filed for this visit.  Subjective Assessment - 04/23/19 1313    Subjective  Patient reports continued LUE soreness, intermittent; Reports adherence with HEP; no other new complaints;    Patient is accompained by:  Family member    Pertinent History  Patient is a 74 y.o. male who presents to outpatient physical therapy with a referral for medical diagnosis of CVA. This patient's chief complaints consist of left hemiplegia and overall deconditioning leading to the following functional deficits: dependent for ADLs, IADLs, unable to transfer to car, dependent transfers at home with hoyer lift, unable to walk or stand without significant assistance, difficulty with W/C navigation..    Currently in Pain?  Yes    Pain Score  8     Pain Location  Arm  Pain Orientation  Left    Pain Descriptors / Indicators  Aching;Sore    Pain Type  Acute pain    Pain Onset  1 to 4 weeks ago    Pain Frequency  Intermittent    Aggravating Factors   worse with cervical extension    Pain Relieving Factors  rest/heat    Effect of Pain on Daily Activities  decreased activity tolerance;    Multiple Pain Sites  No            TREATMENT: Prior to transfers and in between transfers: Patient sittingin wheelchair: Instructed patient in LE strengthening exercise: -hip flexion march x10 reps bilaterally  -LAQ x10 reps bilaterally; -finger flicks to relax UE and stretch hands x5 reps; -deep breathing with erect posture x5 reps with cues for proper positioning;  Pt requires verbal and visual cues to improve ROM; He does exhibit improved ROMbilaterally with better motor control  and LE positioning;  Following exercise: Transferred sit<>Stand in parallel bars (seat adjusted to 27 inch from floor height) x6reps throughout session;He was able to transferCGA-supervision withminVCs for forward weight shift for better trunk control and transfer ability;  Standing in parallel bars: Standing, CGA to close supervision with min VCs for weight shift and increased extension for better stance control;  -Side/side weight shift withCGA,x10,withB rail assist and cues for neutral positioning; -Shift to neutral position, alternate UE lift x5 reps bilaterally; Required cues for weight shift and to improve weight bearing in BLE;  -RLE hip flexion march x10reps with min A to shift to LLE with therapist blocking LLE knee;  -LLE hip flexion march x8Reps (partial ROM) with min A for weight shift and erect posture;  -mini squat x10 reps;    Patient initially exhibits flexed posture with decreased hip extension; He was able to exhibit improved hip extension during subsequent transfers; Although he continues to require min A for safety and trunk control;  Patient required minA to scoot back in chair at end of session due to increased fatigue  Response to treatment: [place].With all transfers patient requiredminVCs for forward weight shift and to increase hip/knee extension in stance.He was able toexhibit improved AROM with seated exerciseand better motor control in standing.He continues to have decreased LLE terminal knee extension in stance which limits stance control. Patient heavily fatigued at end of session.                     PT Education - 04/23/19 1313    Education Details  transfers/positioning, HEP    Person(s) Educated  Patient    Methods  Explanation;Verbal cues    Comprehension  Verbalized understanding;Returned demonstration;Verbal cues required;Need further instruction       PT Short Term Goals - 04/17/19 0902       PT SHORT TERM GOAL #1   Title  Be independent with initial home exercise program for self-management of symptoms.    Baseline  HEP to be given at second session; advice to practice unsupported sitting at home (06/19/2018); 07/08/18: Performing at home, 6/29: needs assistance but is doing exercise program regularly; 08/22/18: adherent;    Time  2    Period  Weeks    Status  Achieved    Target Date  06/26/18        PT Long Term Goals - 04/17/19 0902      PT LONG TERM GOAL #1   Title  Patient will complete all bed mobility with min A to improve functional independence  for getting in and out of bed and adjusting in bed.     Baseline  max A - total A reported by family (06/19/2018); 07/08/18: still requires maxA, dtr reports improvement in rolling since starting therapy; 6/23: min A to supervision in clinic; not doing at home right; 8/5: Pt's daughter states that his rolling has improved, but still has difficulty with all other bed mobility; 10/23/18: pts daughter states that she is doing about 35% of the work if he is in proper position, he does uses the bed rails to help roll;  using Hoyer for coming to sit EOB; 10/12: pts daughter states that she is doing about 30% of the work if he is in proper position, he does uses the bed rails to help roll, but it is improving;  using Hoyer for coming to sit EOB, 10/29: min A for sit to sidelying, rolling supervision, able to transition sidelying to sit with HHA;    Time  12    Period  Weeks    Status  Achieved    Target Date  12/04/18      PT LONG TERM GOAL #2   Title  Patient will complete sit <> stand transfer chair to chair with Min A and LRAD to improve functional independence for household and community mobility.     Baseline  variable min-modA for STS, still requires elevated surface height to 27"    Time  12    Period  Weeks    Status  Partially Met    Target Date  05/23/19      PT LONG TERM GOAL #3   Title  Patient will navigate power w/c with  min A x 100 feet to improve mobility for household and short community distances.    Baseline  required total A (06/12/2018); able to roll 20 feet with min A (06/20/2018); 07/08/18: Pt can go approximately 10' without assist per family;  10/23/2018: pt's daughter states that his power WC mobility has become 100% better with the new hand control, slowed speed, and with practice; 10/12: pt's daughter reports that he can perform household WC mobility and limited community ambulation with minA    Time  12    Period  Weeks    Status  Achieved    Target Date  05/23/19      PT LONG TERM GOAL #4   Title  Patient will complete car transfers W/C to family SUV with min A using LRAD to improve his abilty to participate in community activities.     Baseline  unable to complete car transfers at all (06/12/2018); unable to complete car transfers at all (06/19/2018).; 07/08/18: Unable to perform at this time; 09/11/18: unable to perform at this time; 10/23/2018: unable to complete car transfers at all; 10/12:  unable to complete car transfers at all, 10/29: unable; 01/13/19: unable; 2/11: min-mod A +2, 3/10: min A+1    Time  12    Period  Weeks    Status  Achieved    Target Date  05/23/19      PT LONG TERM GOAL #5   Title  Patient will ambulate at least 10 feet with min A using LRAD to improve mobility for household and community distances.    Baseline  unable to take steps (06/12/2018); able to shift R leg forward and back with max A and RW at edge of plinth (06/19/2018); 07/08/18: unable to ambulate at this time. 09/11/18: unable to perform at this time; 10/23/2018: unable  to perform at this time; 10/12: unable to perform at this time, 10/29: unable at this time; 01/13/19: unable at this time, 2/11: min A to step with RLE, unable to step with LLE, 3/10: able to take 2 steps in parallel bars with min A for safety;    Time  12    Period  Weeks    Status  Partially Met    Target Date  05/23/19      PT LONG TERM GOAL #6   Title   Patient will be able to safety navigate up/down ramp with power chair, exhibiting good safety awareness, mod I to safely enter/exit his home.    Baseline  2/11: supervision with intermittent min A    Time  12    Period  Weeks    Status  On-going    Target Date  05/23/19      PT LONG TERM GOAL #7   Title  Patient will increase functional reach test to >15 inches in sitting to exhibit improved sitting balance and positioning and reduce fall risk;    Baseline  07/30/18: 12 inches; 09/11/18: 10-12 inches; 10/23/18: 12-13 inch; 10/12: 14.5", 10/29: 15.5 inch    Time  4    Period  Weeks    Status  Achieved    Target Date  05/23/19      PT LONG TERM GOAL #8   Title  Patient will increase LLE quad strength to 3+/5 to improve functional strength for standing and mobility;    Baseline  10/28: 2+/5; 01/13/19: 2+/5, 2/11: 3-/5, 04/16/19: 3-/5    Time  12    Period  Weeks    Status  Partially Met    Target Date  05/23/19            Plan - 04/23/19 1321    Clinical Impression Statement  Patient motivated and participated well within session. He was more fatigued but was able to exhibit improved postural control and weight shift in stance this session. Patient does require verbal and visual cues to increase ROM with all exercise for better strengthening. He would benefit from additional skilled PT intervention to improve strength, balance and mobility;    Comorbidities  Relevant past medical history and comorbidities include long term steroid use for lupus, CKD, chronic osteomyelitis, cervical spine stenosis, BPH, APS, alcohol abuse, peripheral artery disease, depression, GERD, obstructive sleep apnea, HTN, IBS, IgA deficiency, lumbar radiculopathy, RA, Stroke, toxic maculopathy in both eyes, systemic lupus erythematosus related syndrome, cardiac catheterization, carotid endarterectomy, cervical laminectomy, coronary angioplasty, Fusion C5-C7, knee arthroplasty, lumbar surgery.    Rehab Potential  Fair     PT Frequency  3x / week    PT Duration  12 weeks    PT Treatment/Interventions  ADLs/Self Care Home Management;Electrical Stimulation;Moist Heat;Cryotherapy;Gait training;Stair training;Functional mobility training;Therapeutic exercise;Balance training;Neuromuscular re-education;Cognitive remediation;Patient/family education;Orthotic Fit/Training;Wheelchair mobility training;Manual techniques;Passive range of motion;Energy conservation;Joint Manipulations    PT Next Visit Plan  Work on strength, sitting/standing balance, functional activities such as rolling, bed mobility, transfers; caregiver training    PT Home Exercise Plan  no updates this session    Consulted and Agree with Plan of Care  Patient;Family member/caregiver    Family Member Consulted  daughter [name]       Patient will benefit from skilled therapeutic intervention in order to improve the following deficits and impairments:  Abnormal gait, Decreased activity tolerance, Decreased cognition, Decreased endurance, Decreased knowledge of use of DME, Decreased range of motion, Decreased skin integrity,  Decreased strength, Impaired perceived functional ability, Impaired sensation, Impaired UE functional use, Improper body mechanics, Pain, Cardiopulmonary status limiting activity, Decreased balance, Decreased coordination, Decreased mobility, Difficulty walking, Impaired tone, Postural dysfunction  Visit Diagnosis: Muscle weakness (generalized)  Other lack of coordination  Difficulty in walking, not elsewhere classified  Repeated falls     Problem List Patient Active Problem List   Diagnosis Date Noted  . Sepsis ([place]) 10/18/2018    Alvon Nygaard PT, DPT 04/23/2019, 1:44 PM  [place] MAIN [facility] SERVICES [address] [place], [place], [address] Phone: [phone]   Fax:  [phone]  Name: Kenneth Summers MRN: [id number] Date of Birth: [birth year]

## 2019-04-24 ENCOUNTER — Other Ambulatory Visit: Payer: Self-pay

## 2019-04-24 ENCOUNTER — Ambulatory Visit: Payer: Medicare Other | Admitting: Occupational Therapy

## 2019-04-24 ENCOUNTER — Ambulatory Visit: Payer: Medicare Other | Admitting: Physical Therapy

## 2019-04-24 ENCOUNTER — Encounter: Payer: Self-pay | Admitting: Physical Therapy

## 2019-04-24 DIAGNOSIS — M6281 Muscle weakness (generalized): Secondary | ICD-10-CM

## 2019-04-24 DIAGNOSIS — R262 Difficulty in walking, not elsewhere classified: Secondary | ICD-10-CM

## 2019-04-24 DIAGNOSIS — R296 Repeated falls: Secondary | ICD-10-CM

## 2019-04-24 DIAGNOSIS — R278 Other lack of coordination: Secondary | ICD-10-CM

## 2019-04-24 NOTE — Therapy (Signed)
[place] MAIN [facility] SERVICES [address] [place], [place], [address] Phone: [phone]   Fax:  [phone]  Physical Therapy Treatment  Patient Details  Name: Kenneth Summers MRN: [id number] Date of Birth: Jun 25, 1945 Referring Provider (PT): Dr. [name], Guy Begin   Encounter Date: 04/24/2019  PT End of Session - 04/24/19 1510    Visit Number  [id number]    Number of Visits  144    Date for PT Re-Evaluation  05/23/19    Authorization Type  Medicare reporting period starting 09/11/18    Authorization Time Period  goals last assessed 03/19/19    PT Start Time  1350    PT Stop Time  1430    PT Time Calculation (min)  40 min    Equipment Utilized During Treatment  Gait belt    Activity Tolerance  Patient tolerated treatment well;No increased pain    Behavior During Therapy  WFL for tasks assessed/performed       Past Medical History:  Diagnosis Date  . Actinic keratosis   . Alcohol abuse   . APS (antiphospholipid syndrome) ([place])   . Basal cell carcinoma   . BPH (benign prostatic hyperplasia)   . CAD (coronary artery disease)   . Cellulitis   . Cervical spinal stenosis   . Chronic osteomyelitis ([place])   . CKD (chronic kidney disease)   . CKD (chronic kidney disease)   . Clostridium difficile diarrhea   . Depression   . Diplopia   . Fatigue   . GERD (gastroesophageal reflux disease)   . Hyperlipemia   . Hypertension   . Hypovitaminosis D   . IBS (irritable bowel syndrome)   . 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 Insomnia   . Left lumbosacral radiculopathy   . Moderate obstructive sleep apnea   . Osteomyelitis of foot ([place])   . PAD (peripheral artery disease) ([place])   . Pruritus   . RA (rheumatoid arthritis) ([place])   . Radiculopathy   . Restless legs syndrome   . RLS (restless legs syndrome)   . SLE (systemic lupus erythematosus related syndrome) ([place])   . 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 Stroke  ([place])   . Toxic maculopathy of both eyes     Past Surgical History:  Procedure Laterality Date  . CARDIAC CATHETERIZATION    . CAROTID ENDARTERECTOMY    . CERVICAL LAMINECTOMY    . CORONARY ANGIOPLASTY    . FRACTURE SURGERY    . fusion C5-6-7    . HERNIA REPAIR    . KNEE ARTHROSCOPY    . TONSILLECTOMY      There were no vitals filed for this visit.  Subjective Assessment - 04/24/19 1402    Subjective  Patient reports continued LUE soreness/pain; Reports adherence with HEP; no new complaints;    Patient is accompained by:  Family member    Pertinent History  Patient is a 74 y.o. male who presents to outpatient physical therapy with a referral for medical diagnosis of CVA. This patient's chief complaints consist of left hemiplegia and overall deconditioning leading to the following functional deficits: dependent for ADLs, IADLs, unable to transfer to car, dependent transfers at home with hoyer lift, unable to walk or stand without significant assistance, difficulty with W/C navigation..    Currently in Pain?  Yes    Pain Score  8     Pain Location  Shoulder    Pain  Orientation  Left    Pain Descriptors / Indicators  Aching;Sore    Pain Type  Acute pain    Pain Onset  1 to 4 weeks ago    Pain Frequency  Intermittent    Aggravating Factors   worse with cervical extension    Pain Relieving Factors  rest/heat    Effect of Pain on Daily Activities  decreased activity tolerance;    Multiple Pain Sites  No            TREATMENT: Prior to transfersand in between transfers: Patient sittingin wheelchair: Instructed patient in LE strengthening exercise: -hip flexion march x12 reps bilaterally  -LAQ x10 reps bilaterally with cues to isolate right/left leg to improve disassociation -finger flicks to relax UE and stretch hands x5 reps; -deep breathing with erect posture x5 reps with cues for proper positioning; -BUE mid rows green tband 2x10 Pt requires verbal and visual cues  to improve ROM; He does exhibit improved ROMbilaterally with better motor control and LE positioning;  Following exercise: Transferred sit<>Stand in parallel bars (seat adjusted to 27 inch from floor height) x6reps throughout session;He was able to transfersupervision withminVCs for forward weight shift for better trunk control and transfer ability;  Standing in parallel bars: Standing, CGA to close supervision with min VCs for weight shift and increased extension for better stance control;  -Side/side weight shift withCGA,x10,withB rail assist and cues for neutral positioning; -Shift to netural position, BLE heel raises for forward/backward weight shift; -RLE hip flexion march x10reps with min A to shift to [employer] therapist blocking LLE knee; -LLE hip flexion march x2Reps (partial ROM) with min A for weight shift and erect posture; -mini squat x10 reps, exhibits better knee extension on LLE and improved BLE quad activation;   Patient able to progress standing activities to taking 2 steps in parallel bars with chair follow. He required mod A to advance LLE forward but was able to step with RLE well;   Patient initially exhibits flexed posture with decreased hip extension; He was able to exhibit improved hip extension during subsequent transfers; Although he continues to require min A for safety and trunk control;  Patient required minA to scoot back in chair at end of session due to increased fatigue  Response to treatment: [place].With all transfers patient requiredminVCs for forward weight shift and to increase hip/knee extension in stance.He was able toexhibit improved AROM with seated exerciseand better motor control in standing.He was able to exhibit better LLE knee extension this session although is unable to achieve full terminal knee extension;  Patient heavily fatigued at end of session.                     PT  Education - 04/24/19 1510    Education Details  transfers/positioning, HEP    Person(s) Educated  Patient;Child(ren)    Methods  Explanation;Verbal cues    Comprehension  Verbalized understanding;Returned demonstration;Verbal cues required;Need further instruction       PT Short Term Goals - 04/17/19 0902      PT SHORT TERM GOAL #1   Title  Be independent with initial home exercise program for self-management of symptoms.    Baseline  HEP to be given at second session; advice to practice unsupported sitting at home (06/19/2018); 07/08/18: Performing at home, 6/29: needs assistance but is doing exercise program regularly; 08/22/18: adherent;    Time  2    Period  Weeks    Status  Achieved  Target Date  06/26/18        PT Long Term Goals - 04/17/19 0902      PT LONG TERM GOAL #1   Title  Patient will complete all bed mobility with min A to improve functional independence for getting in and out of bed and adjusting in bed.     Baseline  max A - total A reported by family (06/19/2018); 07/08/18: still requires maxA, dtr reports improvement in rolling since starting therapy; 6/23: min A to supervision in clinic; not doing at home right; 8/5: Pt's daughter states that his rolling has improved, but still has difficulty with all other bed mobility; 10/23/18: pts daughter states that she is doing about 35% of the work if he is in proper position, he does uses the bed rails to help roll;  using Hoyer for coming to sit EOB; 10/12: pts daughter states that she is doing about 30% of the work if he is in proper position, he does uses the bed rails to help roll, but it is improving;  using Hoyer for coming to sit EOB, 10/29: min A for sit to sidelying, rolling supervision, able to transition sidelying to sit with HHA;    Time  12    Period  Weeks    Status  Achieved    Target Date  12/04/18      PT LONG TERM GOAL #2   Title  Patient will complete sit <> stand transfer chair to chair with Min A and LRAD to  improve functional independence for household and community mobility.     Baseline  variable min-modA for STS, still requires elevated surface height to 27"    Time  12    Period  Weeks    Status  Partially Met    Target Date  05/23/19      PT LONG TERM GOAL #3   Title  Patient will navigate power w/c with min A x 100 feet to improve mobility for household and short community distances.    Baseline  required total A (06/12/2018); able to roll 20 feet with min A (06/20/2018); 07/08/18: Pt can go approximately 10' without assist per family;  10/23/2018: pt's daughter states that his power WC mobility has become 100% better with the new hand control, slowed speed, and with practice; 10/12: pt's daughter reports that he can perform household WC mobility and limited community ambulation with minA    Time  12    Period  Weeks    Status  Achieved    Target Date  05/23/19      PT LONG TERM GOAL #4   Title  Patient will complete car transfers W/C to family SUV with min A using LRAD to improve his abilty to participate in community activities.     Baseline  unable to complete car transfers at all (06/12/2018); unable to complete car transfers at all (06/19/2018).; 07/08/18: Unable to perform at this time; 09/11/18: unable to perform at this time; 10/23/2018: unable to complete car transfers at all; 10/12:  unable to complete car transfers at all, 10/29: unable; 01/13/19: unable; 2/11: min-mod A +2, 3/10: min A+1    Time  12    Period  Weeks    Status  Achieved    Target Date  05/23/19      PT LONG TERM GOAL #5   Title  Patient will ambulate at least 10 feet with min A using LRAD to improve mobility for household and community  distances.    Baseline  unable to take steps (06/12/2018); able to shift R leg forward and back with max A and RW at edge of plinth (06/19/2018); 07/08/18: unable to ambulate at this time. 09/11/18: unable to perform at this time; 10/23/2018: unable to perform at this time; 10/12: unable to perform at  this time, 10/29: unable at this time; 01/13/19: unable at this time, 2/11: min A to step with RLE, unable to step with LLE, 3/10: able to take 2 steps in parallel bars with min A for safety;    Time  12    Period  Weeks    Status  Partially Met    Target Date  05/23/19      PT LONG TERM GOAL #6   Title  Patient will be able to safety navigate up/down ramp with power chair, exhibiting good safety awareness, mod I to safely enter/exit his home.    Baseline  2/11: supervision with intermittent min A    Time  12    Period  Weeks    Status  On-going    Target Date  05/23/19      PT LONG TERM GOAL #7   Title  Patient will increase functional reach test to >15 inches in sitting to exhibit improved sitting balance and positioning and reduce fall risk;    Baseline  07/30/18: 12 inches; 09/11/18: 10-12 inches; 10/23/18: 12-13 inch; 10/12: 14.5", 10/29: 15.5 inch    Time  4    Period  Weeks    Status  Achieved    Target Date  05/23/19      PT LONG TERM GOAL #8   Title  Patient will increase LLE quad strength to 3+/5 to improve functional strength for standing and mobility;    Baseline  10/28: 2+/5; 01/13/19: 2+/5, 2/11: 3-/5, 04/16/19: 3-/5    Time  12    Period  Weeks    Status  Partially Met    Target Date  05/23/19            Plan - 04/24/19 1510    Clinical Impression Statement  Patient motivated and participated very well this session. He is able to exhibit better stand position and improved LLE muscle activation in standing. He does fatigue quickly requiring short seated rest breaks. Patient did have difficulty with LLE hip flexion march in standing. Concerned patient is having trouble disassociating LLE from RLE as he exhibits increased flexed posture and increased RLE knee flexion while trying to lift LLE. Reinforced HEPand instructed patient/caregiver to focus on trying to isolate LLE from RLE for better motor control and muscle activation. Patient was able to take a few steps in  parallel bars. He would benefit from additional skilled PT Intervention to improve strength, balance and mobility;    Comorbidities  Relevant past medical history and comorbidities include long term steroid use for lupus, CKD, chronic osteomyelitis, cervical spine stenosis, BPH, APS, alcohol abuse, peripheral artery disease, depression, GERD, obstructive sleep apnea, HTN, IBS, IgA deficiency, lumbar radiculopathy, RA, Stroke, toxic maculopathy in both eyes, systemic lupus erythematosus related syndrome, cardiac catheterization, carotid endarterectomy, cervical laminectomy, coronary angioplasty, Fusion C5-C7, knee arthroplasty, lumbar surgery.    Rehab Potential  Fair    PT Frequency  3x / week    PT Duration  12 weeks    PT Treatment/Interventions  ADLs/Self Care Home Management;Electrical Stimulation;Moist Heat;Cryotherapy;Gait training;Stair training;Functional mobility training;Therapeutic exercise;Balance training;Neuromuscular re-education;Cognitive remediation;Patient/family education;Orthotic Fit/Training;Wheelchair mobility training;Manual techniques;Passive range of motion;Energy conservation;Joint Manipulations  PT Next Visit Plan  Work on strength, sitting/standing balance, functional activities such as rolling, bed mobility, transfers; caregiver training    PT Home Exercise Plan  no updates this session    Consulted and Agree with Plan of Care  Patient;Family member/caregiver    Family Member Consulted  daughter [name]       Patient will benefit from skilled therapeutic intervention in order to improve the following deficits and impairments:  Abnormal gait, Decreased activity tolerance, Decreased cognition, Decreased endurance, Decreased knowledge of use of DME, Decreased range of motion, Decreased skin integrity, Decreased strength, Impaired perceived functional ability, Impaired sensation, Impaired UE functional use, Improper body mechanics, Pain, Cardiopulmonary status limiting activity,  Decreased balance, Decreased coordination, Decreased mobility, Difficulty walking, Impaired tone, Postural dysfunction  Visit Diagnosis: Muscle weakness (generalized)  Other lack of coordination  Difficulty in walking, not elsewhere classified  Repeated falls     Problem List Patient Active Problem List   Diagnosis Date Noted  . Sepsis ([place]) 10/18/2018    Kaytelyn Glore PT, DPT 04/24/2019, 3:14 PM  [place] MAIN [facility] SERVICES [address] [place], [place], [address] Phone: [phone]   Fax:  [phone]  Name: MYLEZ VENABLE MRN: [id number] Date of Birth: 1946-01-21

## 2019-04-25 ENCOUNTER — Ambulatory Visit: Payer: Medicare Other | Admitting: Dermatology

## 2019-04-27 ENCOUNTER — Encounter: Payer: Self-pay | Admitting: Occupational Therapy

## 2019-04-27 NOTE — Therapy (Signed)
[place] MAIN [facility] SERVICES [address] [place], [place], [address] Phone: [phone]   Fax:  [phone]  Occupational Therapy Treatment  Patient Details  Name: Kenneth Summers MRN: [id number] Date of Birth: 24-Jun-1945 No data recorded  Encounter Date: 04/24/2019  OT End of Session - 04/27/19 1311    Visit Number  [id number]    Number of Visits  133    Date for OT Re-Evaluation  06/30/19    Authorization Type  Progress report period starting 12/26/2018    OT Start Time  1300    OT Stop Time  1345    OT Time Calculation (min)  45 min    Activity Tolerance  Patient tolerated treatment well;Patient limited by fatigue    Behavior During Therapy  [facility] for tasks assessed/performed       Past Medical History:  Diagnosis Date  . Actinic keratosis   . Alcohol abuse   . APS (antiphospholipid syndrome) ([place])   . Basal cell carcinoma   . BPH (benign prostatic hyperplasia)   . CAD (coronary artery disease)   . Cellulitis   . Cervical spinal stenosis   . Chronic osteomyelitis ([place])   . CKD (chronic kidney disease)   . CKD (chronic kidney disease)   . Clostridium difficile diarrhea   . Depression   . Diplopia   . Fatigue   . GERD (gastroesophageal reflux disease)   . Hyperlipemia   . Hypertension   . Hypovitaminosis D   . IBS (irritable bowel syndrome)   . 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 Insomnia   . Left lumbosacral radiculopathy   . Moderate obstructive sleep apnea   . Osteomyelitis of foot ([place])   . PAD (peripheral artery disease) ([place])   . Pruritus   . RA (rheumatoid arthritis) ([place])   . Radiculopathy   . Restless legs syndrome   . RLS (restless legs syndrome)   . SLE (systemic lupus erythematosus related syndrome) ([place])   . 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 Stroke ([place])   . Toxic maculopathy of both eyes     Past Surgical History:  Procedure Laterality Date  . CARDIAC CATHETERIZATION    .  CAROTID ENDARTERECTOMY    . CERVICAL LAMINECTOMY    . CORONARY ANGIOPLASTY    . FRACTURE SURGERY    . fusion C5-6-7    . HERNIA REPAIR    . KNEE ARTHROSCOPY    . TONSILLECTOMY      There were no vitals filed for this visit.  Subjective Assessment - 04/27/19 1310    Subjective   Pt. reports feeling better today.    Patient is accompanied by:  Family member    Pertinent History  Pt. is a 74 y.o. 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 Pt. PMHx includes: Multiple Falls, Lupus, DJD, Renal Abscess, CVA. Pt. resides with his wife. Pt.'s wife and daughter assist with ADLs. Pt. has caregivers in  for 2 hours a day, 6 days a week. Pt. received Rehab services in acute care, at SNF for STR, and [facility] services. Pt. is retired from [employer] for [employer] and [employer].    Patient Stated Goals  To regain use of his left UE, and do more for himself.    Currently in Pain?  No/denies      OT TREATMENT    Neuromuscular Re-education:  Pt. worked on  grasping 1" circular velcro spheres from a bag with his left hand. Pt. Worked on flexing forward with the trunk with reaching place them onto an elevated target. The target was changed to encourage trunk rotation when reaching.  Self care:  Pt. worked on UE dressing tasks using hemi-dressing techniques. Pt.requiredcues forinitiating, and organizing thepositionofthe sweatshirtwhenpreparing todonnthe zip down sweatshirt. Pt. Initially attempts to donn it backwards, however is able to self correct the position with cues.Pt. Requiredfewercues to thread the LUE with the right, assist to bring the jacket around his back, and reposition the jacket to prepare for zipping.Pt. Required assist to initiate fastening the zipper.   Pt. is making progress with UE dressing skills, however continues to require cues, and assist to donn the jacket in the correct direction, bring the jacket around his back, and  shoulders, and manipulate the zipper. Pt. continues to require cues for left sided awareness, trunk control. Pt. continues to work on these skills in order to work towards increasing engagement of the LUE during ADLs, and IADL tasks.                          OT Education - 04/27/19 1311    Education Details  Left UE functioning, left sided awareness.    Person(s) Educated  Patient    Methods  Explanation;Demonstration    Comprehension  Returned demonstration;Tactile cues required;Verbalized understanding;Need further instruction          OT Long Term Goals - 04/07/19 1415      OT LONG TERM GOAL #1   Title  Pt. will increase left shoulder flexion AROM by 10 degrees to access cabinet/shelf.    Baseline  Shoulder flexion improved to 125. Pt. continues to work on reaching with increasing emphasis on flexing trunk to improve functional reach.    Time  12    Period  Weeks    Status  On-going    Target Date  06/30/19      OT LONG TERM GOAL #2   Title  Pt. will donn a shirt with Supervision.    Baseline  MinA donning a zip down jacket with consistent cues to initiate the correct direction of the jacket.    Time  12    Period  Weeks    Status  On-going    Target Date  06/30/19      OT LONG TERM GOAL #4   Title  Pt. will improve left grip strength by 10# to be able to open a jar/container.    Baseline  Pt. continues to progress with grip strength, and continues to work towards opening containers, and bottles.    Time  12    Period  Weeks    Status  On-going    Target Date  06/30/19      OT LONG TERM GOAL #5   Title  Pt. will improve left hand [facility] skills to be able to assist with buttoning/zipping.    Baseline  Pt. is improving with manipulating the zipper on his jacket, however continues to require practice. Pt. continues to work on improving bilateral [facility] skills for buttoning/zipping    Time  12    Period  Weeks    Status  On-going    Target Date   06/30/19      OT LONG TERM GOAL #6   Title  Pt. will demonstrate visual conmpensatory strategies during 100% of the time during ADLs    Baseline  Pt. requires cues at times for left sides awareness.    Time  12    Period  Weeks    Status  On-going    Target Date  06/30/19      OT LONG TERM GOAL #7   Title  Pt. will prepare a simple cold snack from w/c with supervision using cognitive compensatory strategies 100% of the time.    Baseline  Pt. has difficulty with getting the w/c close enough to access the refrigerator      OT LONG TERM GOAL #8   Title  Pt. will accurately identify potential safety hazard using good safety awareness, and judgement 100% for ADLs, and IADLs.    Baseline  Pt. requires cues    Time  12    Period  Weeks    Status  On-going    Target Date  06/30/19      OT LONG TERM GOAL  #9   TITLE  Pt. will  navigate his w/c around obstacles with Supervision and 100% accuracy    Baseline  Pt. continues to require cuing for obstacle on the left, however requires less cues.    Time  12    Period  Weeks    Status  On-going    Target Date  04/30/19      OT LONG TERM GOAL  #11   TITLE  Pt. will increase BUE strength by 74mm grades in preparation for ADL transfers.    Baseline  Pt. continues to present with limited BUE strength    Time  12    Period  Weeks    Status  On-going    Target Date  06/30/19            Plan - 04/27/19 1312    Clinical Impression Statement Pt. is making progress with UE dressing skills, however continues to require cues, and assist to donn the jacket in the correct direction, bring the jacket around his back, and shoulders, and manipulate the zipper. Pt. continues to require cues for left sided awareness, trunk control. Pt. continues to work on these skills in order to work towards increasing engagement of the LUE during ADLs, and IADL tasks.   OT Occupational Profile and History  Problem Focused Assessment - Including review of records  relating to presenting problem    Occupational performance deficits (Please refer to evaluation for details):  ADL's    Body Structure / Function / Physical Skills  UE functional use;Coordination;FMC;Dexterity;Strength;ROM    Cognitive Skills  Attention;Memory;Emotional;Problem Solve;Safety Awareness    Rehab Potential  Good    Clinical Decision Making  Several treatment options, min-mod task modification necessary    Comorbidities Affecting Occupational Performance:  Presence of comorbidities impacting occupational performance    Comorbidities impacting occupational performance description:  Phyical, cognitive, visual,  medical comorbidities    Modification or Assistance to Complete Evaluation   No modification of tasks or assist necessary to complete eval    OT Frequency  2x / week    OT Duration  12 weeks    OT Treatment/Interventions  Self-care/ADL training;DME and/or AE instruction;Therapeutic exercise;Therapeutic activities;Moist Heat;Cognitive remediation/compensation;Neuromuscular education;Visual/perceptual remediation/compensation;Coping strategies training;Patient/family education;Passive range of motion;Psychosocial skills training;Energy conservation;Functional Mobility Training    Consulted and Agree with Plan of Care  Patient    Family Member Consulted  Daughter [name]       Patient will benefit from skilled therapeutic intervention in order to improve the following deficits and impairments:   Body Structure / Function /  Physical Skills: UE functional use, Coordination, FMC, Dexterity, Strength, ROM Cognitive Skills: Attention, Memory, Emotional, Problem Solve, Safety Awareness     Visit Diagnosis: Muscle weakness (generalized)    Problem List Patient Active Problem List   Diagnosis Date Noted  . Sepsis ([place]) 10/18/2018    Harrel Carina, MS, OTR/L 04/27/2019, 1:14 PM  [place] MAIN [facility] SERVICES [address]  [place], [place], [address] Phone: [phone]   Fax:  [phone]  Name: Kenneth Summers MRN: [id number] Date of Birth: February 20, 1945

## 2019-04-28 ENCOUNTER — Encounter: Payer: Self-pay | Admitting: Occupational Therapy

## 2019-04-28 ENCOUNTER — Ambulatory Visit: Payer: Medicare Other | Admitting: Physical Therapy

## 2019-04-28 ENCOUNTER — Ambulatory Visit: Payer: Medicare Other | Admitting: Occupational Therapy

## 2019-04-28 ENCOUNTER — Other Ambulatory Visit: Payer: Self-pay

## 2019-04-28 ENCOUNTER — Encounter: Payer: Self-pay | Admitting: Physical Therapy

## 2019-04-28 DIAGNOSIS — M6281 Muscle weakness (generalized): Secondary | ICD-10-CM | POA: Diagnosis not present

## 2019-04-28 DIAGNOSIS — R278 Other lack of coordination: Secondary | ICD-10-CM

## 2019-04-28 DIAGNOSIS — R296 Repeated falls: Secondary | ICD-10-CM

## 2019-04-28 DIAGNOSIS — R262 Difficulty in walking, not elsewhere classified: Secondary | ICD-10-CM

## 2019-04-28 NOTE — Therapy (Signed)
[place] MAIN [facility] SERVICES [address] [place], [place], [address] Phone: [phone]   Fax:  [phone]  Physical Therapy Treatment  Patient Details  Name: Kenneth Summers MRN: [id number] Date of Birth: 06-22-1945 Referring Provider (PT): Dr. [name], Guy Begin   Encounter Date: 04/28/2019  PT End of Session - 04/28/19 1319    Visit Number  [id number]    Number of Visits  144    Date for PT Re-Evaluation  05/23/19    Authorization Type  Medicare reporting period starting 09/11/18    Authorization Time Period  goals last assessed 03/19/19    PT Start Time  1302    PT Stop Time  1345    PT Time Calculation (min)  43 min    Equipment Utilized During Treatment  Gait belt    Activity Tolerance  Patient tolerated treatment well;No increased pain    Behavior During Therapy  WFL for tasks assessed/performed       Past Medical History:  Diagnosis Date  . Actinic keratosis   . Alcohol abuse   . APS (antiphospholipid syndrome) ([place])   . Basal cell carcinoma   . BPH (benign prostatic hyperplasia)   . CAD (coronary artery disease)   . Cellulitis   . Cervical spinal stenosis   . Chronic osteomyelitis ([place])   . CKD (chronic kidney disease)   . CKD (chronic kidney disease)   . Clostridium difficile diarrhea   . Depression   . Diplopia   . Fatigue   . GERD (gastroesophageal reflux disease)   . Hyperlipemia   . Hypertension   . Hypovitaminosis D   . IBS (irritable bowel syndrome)   . 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 Insomnia   . Left lumbosacral radiculopathy   . Moderate obstructive sleep apnea   . Osteomyelitis of foot ([place])   . PAD (peripheral artery disease) ([place])   . Pruritus   . RA (rheumatoid arthritis) ([place])   . Radiculopathy   . Restless legs syndrome   . RLS (restless legs syndrome)   . SLE (systemic lupus erythematosus related syndrome) ([place])   . 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 Stroke  ([place])   . Toxic maculopathy of both eyes     Past Surgical History:  Procedure Laterality Date  . CARDIAC CATHETERIZATION    . CAROTID ENDARTERECTOMY    . CERVICAL LAMINECTOMY    . CORONARY ANGIOPLASTY    . FRACTURE SURGERY    . fusion C5-6-7    . HERNIA REPAIR    . KNEE ARTHROSCOPY    . TONSILLECTOMY      There were no vitals filed for this visit.  Subjective Assessment - 04/28/19 1319    Subjective  Patient reports feeling better. he is not having as much LUE discomfort. He does report dizziness this morning, but no dizziness in the chair.    Patient is accompained by:  Family member    Pertinent History  Patient is a 74 y.o. male who presents to outpatient physical therapy with a referral for medical diagnosis of CVA. This patient's chief complaints consist of left hemiplegia and overall deconditioning leading to the following functional deficits: dependent for ADLs, IADLs, unable to transfer to car, dependent transfers at home with hoyer lift, unable to walk or stand without significant assistance, difficulty with W/C navigation..    Currently in Pain?  No/denies    Pain Onset  1  to 4 weeks ago    Multiple Pain Sites  No                 TREATMENT:  Prior to transfers and in between transfers:  Patient sitting in wheelchair: Instructed patient in LE strengthening exercise: -hip flexion march x15 reps bilaterally  -LAQ x10 reps bilaterally with cues to isolate right/left leg to improve disassociation -finger flicks to relax UE and stretch hands x5 reps; -deep breathing with erect posture x5 reps with cues for proper positioning;  Pt requires verbal and visual cues to improve ROM; He does exhibit improved ROM  bilaterally with better motor control and LE positioning;   Following exercise:  Transferred sit<>Stand in parallel bars (seat adjusted to 27 inch from floor height) x4 reps throughout session;He was able to transfer supervision with min VCs for forward weight  shift for better trunk control and transfer ability;   Standing in parallel bars: Standing,  CGA to close supervision with min VCs for weight shift and increased extension for better stance control;  -Side/side weight shift with CGA,  x10, with B rail assist and cues for neutral positioning;  -Shift to netural position, BLE heel raises for forward/backward weight shift x10; -RLE hip flexion march x10 reps with min A to shift to LLE with therapist blocking LLE knee;  -LLE hip flexion march x 2 Reps (partial ROM) with min A for weight shift and erect posture; Pt able to exhibit better LLE hip flexion but does fatigue quickly;       Patient initially exhibits flexed posture with decreased hip extension; He was able to exhibit improved hip extension during  subsequent transfers; Although he continues to require min A for safety and trunk control;    Patient required min A to scoot back in chair at end of session due to increased fatigue   Response to treatment: Tolerated fair. He does exhibit increased fatigue this session. With all transfers patient required min VCs for forward weight shift and to increase hip/knee extension in stance. He was able to exhibit improved AROM with seated exercise and better motor control in standing. He was able to exhibit better LLE knee extension this session although is unable to achieve full terminal knee extension;    Vitals monitored, following standing HR 102 bpm; BP 97/57; Patient does report increased headache with increased activity; he also had 1 dizzy spell which was relieved with seated rest break;  Patient heavily fatigued at end of session.                  PT Education - 04/28/19 1319    Education Details  transfers/positioning, HEP    Person(s) Educated  Patient;Child(ren)    Methods  Explanation;Verbal cues    Comprehension  Verbalized understanding;Returned demonstration;Verbal cues required;Need further instruction       PT Short  Term Goals - 04/17/19 0902      PT SHORT TERM GOAL #1   Title  Be independent with initial home exercise program for self-management of symptoms.    Baseline  HEP to be given at second session; advice to practice unsupported sitting at home (06/19/2018); 07/08/18: Performing at home, 6/29: needs assistance but is doing exercise program regularly; 08/22/18: adherent;    Time  2    Period  Weeks    Status  Achieved    Target Date  06/26/18        PT Long Term Goals - 04/17/19 1610  PT LONG TERM GOAL #1   Title  Patient will complete all bed mobility with min A to improve functional independence for getting in and out of bed and adjusting in bed.     Baseline  max A - total A reported by family (06/19/2018); 07/08/18: still requires maxA, dtr reports improvement in rolling since starting therapy; 6/23: min A to supervision in clinic; not doing at home right; 8/5: Pt's daughter states that his rolling has improved, but still has difficulty with all other bed mobility; 10/23/18: pts daughter states that she is doing about 35% of the work if he is in proper position, he does uses the bed rails to help roll;  using Hoyer for coming to sit EOB; 10/12: pts daughter states that she is doing about 30% of the work if he is in proper position, he does uses the bed rails to help roll, but it is improving;  using Hoyer for coming to sit EOB, 10/29: min A for sit to sidelying, rolling supervision, able to transition sidelying to sit with HHA;    Time  12    Period  Weeks    Status  Achieved    Target Date  12/04/18      PT LONG TERM GOAL #2   Title  Patient will complete sit <> stand transfer chair to chair with Min A and LRAD to improve functional independence for household and community mobility.     Baseline  variable min-modA for STS, still requires elevated surface height to 27"    Time  12    Period  Weeks    Status  Partially Met    Target Date  05/23/19      PT LONG TERM GOAL #3   Title  Patient  will navigate power w/c with min A x 100 feet to improve mobility for household and short community distances.    Baseline  required total A (06/12/2018); able to roll 20 feet with min A (06/20/2018); 07/08/18: Pt can go approximately 10' without assist per family;  10/23/2018: pt's daughter states that his power WC mobility has become 100% better with the new hand control, slowed speed, and with practice; 10/12: pt's daughter reports that he can perform household WC mobility and limited community ambulation with minA    Time  12    Period  Weeks    Status  Achieved    Target Date  05/23/19      PT LONG TERM GOAL #4   Title  Patient will complete car transfers W/C to family SUV with min A using LRAD to improve his abilty to participate in community activities.     Baseline  unable to complete car transfers at all (06/12/2018); unable to complete car transfers at all (06/19/2018).; 07/08/18: Unable to perform at this time; 09/11/18: unable to perform at this time; 10/23/2018: unable to complete car transfers at all; 10/12:  unable to complete car transfers at all, 10/29: unable; 01/13/19: unable; 2/11: min-mod A +2, 3/10: min A+1    Time  12    Period  Weeks    Status  Achieved    Target Date  05/23/19      PT LONG TERM GOAL #5   Title  Patient will ambulate at least 10 feet with min A using LRAD to improve mobility for household and community distances.    Baseline  unable to take steps (06/12/2018); able to shift R leg forward and back with max A and  RW at edge of plinth (06/19/2018); 07/08/18: unable to ambulate at this time. 09/11/18: unable to perform at this time; 10/23/2018: unable to perform at this time; 10/12: unable to perform at this time, 10/29: unable at this time; 01/13/19: unable at this time, 2/11: min A to step with RLE, unable to step with LLE, 3/10: able to take 2 steps in parallel bars with min A for safety;    Time  12    Period  Weeks    Status  Partially Met    Target Date  05/23/19      PT  LONG TERM GOAL #6   Title  Patient will be able to safety navigate up/down ramp with power chair, exhibiting good safety awareness, mod I to safely enter/exit his home.    Baseline  2/11: supervision with intermittent min A    Time  12    Period  Weeks    Status  On-going    Target Date  05/23/19      PT LONG TERM GOAL #7   Title  Patient will increase functional reach test to >15 inches in sitting to exhibit improved sitting balance and positioning and reduce fall risk;    Baseline  07/30/18: 12 inches; 09/11/18: 10-12 inches; 10/23/18: 12-13 inch; 10/12: 14.5", 10/29: 15.5 inch    Time  4    Period  Weeks    Status  Achieved    Target Date  05/23/19      PT LONG TERM GOAL #8   Title  Patient will increase LLE quad strength to 3+/5 to improve functional strength for standing and mobility;    Baseline  10/28: 2+/5; 01/13/19: 2+/5, 2/11: 3-/5, 04/16/19: 3-/5    Time  12    Period  Weeks    Status  Partially Met    Target Date  05/23/19            Plan - 04/29/19 1131    Clinical Impression Statement  Patient motivated and participated fair this session. He does exhibit increased fatigue and had a harder time in standing this session with increased shortness of breath reported. Patient did complain of headache with progressive standing. Vitals assessed, BP WNL; Pt does exhibit elevated HR >100 with progressive standing. He would benefit from additional skilled PT intervention to improve strength, balance and mobility;    Comorbidities  Relevant past medical history and comorbidities include long term steroid use for lupus, CKD, chronic osteomyelitis, cervical spine stenosis, BPH, APS, alcohol abuse, peripheral artery disease, depression, GERD, obstructive sleep apnea, HTN, IBS, IgA deficiency, lumbar radiculopathy, RA, Stroke, toxic maculopathy in both eyes, systemic lupus erythematosus related syndrome, cardiac catheterization, carotid endarterectomy, cervical laminectomy, coronary  angioplasty, Fusion C5-C7, knee arthroplasty, lumbar surgery.    Rehab Potential  Fair    PT Frequency  3x / week    PT Duration  12 weeks    PT Treatment/Interventions  ADLs/Self Care Home Management;Electrical Stimulation;Moist Heat;Cryotherapy;Gait training;Stair training;Functional mobility training;Therapeutic exercise;Balance training;Neuromuscular re-education;Cognitive remediation;Patient/family education;Orthotic Fit/Training;Wheelchair mobility training;Manual techniques;Passive range of motion;Energy conservation;Joint Manipulations    PT Next Visit Plan  Work on strength, sitting/standing balance, functional activities such as rolling, bed mobility, transfers; caregiver training    PT Home Exercise Plan  no updates this session    Consulted and Agree with Plan of Care  Patient;Family member/caregiver    Family Member Consulted  daughter [name]       Patient will benefit from skilled therapeutic intervention in order to improve  the following deficits and impairments:  Abnormal gait, Decreased activity tolerance, Decreased cognition, Decreased endurance, Decreased knowledge of use of DME, Decreased range of motion, Decreased skin integrity, Decreased strength, Impaired perceived functional ability, Impaired sensation, Impaired UE functional use, Improper body mechanics, Pain, Cardiopulmonary status limiting activity, Decreased balance, Decreased coordination, Decreased mobility, Difficulty walking, Impaired tone, Postural dysfunction  Visit Diagnosis: Muscle weakness (generalized)  Other lack of coordination  Difficulty in walking, not elsewhere classified  Repeated falls     Problem List Patient Active Problem List   Diagnosis Date Noted  . Sepsis ([place]) 10/18/2018    Jacorian Golaszewski PT, DPT 04/29/2019, 11:34 AM  [place] MAIN [facility] SERVICES [address] [place], [place], [address] Phone: [phone]   Fax:  [phone]  Name:  YSMAEL HIRES MRN: [id number] Date of Birth: 11-06-1945

## 2019-04-28 NOTE — Therapy (Addendum)
[place] MAIN [facility] SERVICES [address] [place], [place], [address] Phone: [phone]   Fax:  [phone]  Occupational Therapy Treatment  Patient Details  Name: Kenneth Summers MRN: [id number] Date of Birth: 1945/09/11 No data recorded  Encounter Date: 04/28/2019  OT End of Session - 04/28/19 1353    Visit Number  [id number]    Number of Visits  133    Date for OT Re-Evaluation  06/30/19    Authorization Type  Progress report period starting 12/26/2018    OT Start Time  1347    OT Stop Time  1430    OT Time Calculation (min)  43 min    Activity Tolerance  Patient tolerated treatment well;Patient limited by fatigue    Behavior During Therapy  [facility] for tasks assessed/performed       Past Medical History:  Diagnosis Date  . Actinic keratosis   . Alcohol abuse   . APS (antiphospholipid syndrome) ([place])   . Basal cell carcinoma   . BPH (benign prostatic hyperplasia)   . CAD (coronary artery disease)   . Cellulitis   . Cervical spinal stenosis   . Chronic osteomyelitis ([place])   . CKD (chronic kidney disease)   . CKD (chronic kidney disease)   . Clostridium difficile diarrhea   . Depression   . Diplopia   . Fatigue   . GERD (gastroesophageal reflux disease)   . Hyperlipemia   . Hypertension   . Hypovitaminosis D   . IBS (irritable bowel syndrome)   . IgA deficiency ([place])   . Insomnia   . Left lumbosacral radiculopathy   . Moderate obstructive sleep apnea   . Osteomyelitis of foot ([place])   . PAD (peripheral artery disease) ([place])   . Pruritus   . RA (rheumatoid arthritis) ([place])   . Radiculopathy   . Restless legs syndrome   . RLS (restless legs syndrome)   . SLE (systemic lupus erythematosus related syndrome) ([place])   . 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 Stroke ([place])   . Toxic maculopathy of both eyes     Past Surgical History:  Procedure Laterality Date  . CARDIAC CATHETERIZATION    .  CAROTID ENDARTERECTOMY    . CERVICAL LAMINECTOMY    . CORONARY ANGIOPLASTY    . FRACTURE SURGERY    . fusion C5-6-7    . HERNIA REPAIR    . KNEE ARTHROSCOPY    . TONSILLECTOMY      There were no vitals filed for this visit.  Subjective Assessment - 04/28/19 1352    Subjective   Pt. reports feeling better today.    Patient is accompanied by:  Family member    Pertinent History  Pt. is a 74 y.o. 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 Pt. PMHx includes: Multiple Falls, Lupus, DJD, Renal Abscess, CVA. Pt. resides with his wife. Pt.'s wife and daughter assist with ADLs. Pt. has caregivers in  for 2 hours a day, 6 days a week. Pt. received Rehab services in acute care, at SNF for STR, and [place] services. Pt. is retired from [employer] for [employer] and [employer].    Patient Stated Goals  To regain use of his left UE, and do more for himself.    Currently in Pain?  Yes    Pain Score  2     Pain Location  Head  Pain Descriptors / Indicators  Aching      OT TREATMENT    Neuromuscular Re-education:  Pt. worked on grasping 1" circular velcro spheres from a bag with his left hand, while holding the g=bag with his right hand. Pt. Worked on flexing forward with the trunk with reaching place them onto an elevated target. The target was changed to encourage trunk rotation when reaching.  Self care:  Pt. worked on UE dressing tasks using hemi-dressing techniques. Pt.requiredcues forinitiating, and organizing thepositionofthe sweatshirtwhenpreparing todonnthe zip down sweatshirt. Pt. Initially attempts to donn it backwards, however is able to self correct the position with cues.Pt. Requiredfewercues to thread the LUE with the right, assist to bring the jacket around his back, and reposition the jacket to prepare for zipping.Pt. Required assist to initiate fastening the zipper.  Pt. had headache pain today. Pt. is making progress with  UE dressing skills, however continues to required increased cues, and assist to donn the jacket in the correct direction, bring the jacket around his back, and shoulders, and manipulate the zipper. Pt. continues to require cues for left sided awareness, trunk control, LUE strength, and coordination skills. Pt. continues to work on these skills in order to increase engagement of the LUE during ADLs, and IADL tasks.                     OT Education - 04/28/19 1353    Education Details  Left UE functioning, left sided awareness.    Person(s) Educated  Patient    Methods  Explanation;Demonstration    Comprehension  Returned demonstration;Tactile cues required;Verbalized understanding;Need further instruction          OT Long Term Goals - 04/07/19 1415      OT LONG TERM GOAL #1   Title  Pt. will increase left shoulder flexion AROM by 10 degrees to access cabinet/shelf.    Baseline  Shoulder flexion improved to 125. Pt. continues to work on reaching with increasing emphasis on flexing trunk to improve functional reach.    Time  12    Period  Weeks    Status  On-going    Target Date  06/30/19      OT LONG TERM GOAL #2   Title  Pt. will donn a shirt with Supervision.    Baseline  MinA donning a zip down jacket with consistent cues to initiate the correct direction of the jacket.    Time  12    Period  Weeks    Status  On-going    Target Date  06/30/19      OT LONG TERM GOAL #4   Title  Pt. will improve left grip strength by 10# to be able to open a jar/container.    Baseline  Pt. continues to progress with grip strength, and continues to work towards opening containers, and bottles.    Time  12    Period  Weeks    Status  On-going    Target Date  06/30/19      OT LONG TERM GOAL #5   Title  Pt. will improve left hand [facility] skills to be able to assist with buttoning/zipping.    Baseline  Pt. is improving with manipulating the zipper on his jacket, however continues  to require practice. Pt. continues to work on improving bilateral [facility] skills for buttoning/zipping    Time  12    Period  Weeks    Status  On-going    Target Date  06/30/19      OT LONG TERM GOAL #6   Title  Pt. will demonstrate visual conmpensatory strategies during 100% of the time during ADLs    Baseline  Pt. requires cues at times for left sides awareness.    Time  12    Period  Weeks    Status  On-going    Target Date  06/30/19      OT LONG TERM GOAL #7   Title  Pt. will prepare a simple cold snack from w/c with supervision using cognitive compensatory strategies 100% of the time.    Baseline  Pt. has difficulty with getting the w/c close enough to access the refrigerator      OT LONG TERM GOAL #8   Title  Pt. will accurately identify potential safety hazard using good safety awareness, and judgement 100% for ADLs, and IADLs.    Baseline  Pt. requires cues    Time  12    Period  Weeks    Status  On-going    Target Date  06/30/19      OT LONG TERM GOAL  #9   TITLE  Pt. will  navigate his w/c around obstacles with Supervision and 100% accuracy    Baseline  Pt. continues to require cuing for obstacle on the left, however requires less cues.    Time  12    Period  Weeks    Status  On-going    Target Date  04/30/19      OT LONG TERM GOAL  #11   TITLE  Pt. will increase BUE strength by 93mm grades in preparation for ADL transfers.    Baseline  Pt. continues to present with limited BUE strength    Time  12    Period  Weeks    Status  On-going    Target Date  06/30/19            Plan - 04/28/19 1354    Clinical Impression Statement Pt. had headache pain today. Pt. is making progress with UE dressing skills, however continues to required increased cues, and assist to donn the jacket in the correct direction, bring the jacket around his back, and shoulders, and manipulate the zipper. Pt. continues to require cues for left sided awareness, trunk control, LUE strength, and  coordination skills. Pt. continues to work on these skills in order to increase engagement of the LUE during ADLs, and IADL tasks.    OT Occupational Profile and History  Problem Focused Assessment - Including review of records relating to presenting problem    Occupational performance deficits (Please refer to evaluation for details):  ADL's    Body Structure / Function / Physical Skills  UE functional use;Coordination;FMC;Dexterity;Strength;ROM    Cognitive Skills  Attention;Memory;Emotional;Problem Solve;Safety Awareness    Rehab Potential  Good    Clinical Decision Making  Several treatment options, min-mod task modification necessary    Comorbidities Affecting Occupational Performance:  Presence of comorbidities impacting occupational performance    Comorbidities impacting occupational performance description:  Phyical, cognitive, visual,  medical comorbidities    Modification or Assistance to Complete Evaluation   No modification of tasks or assist necessary to complete eval    OT Frequency  2x / week    OT Duration  12 weeks    OT Treatment/Interventions  Self-care/ADL training;DME and/or AE instruction;Therapeutic exercise;Therapeutic activities;Moist Heat;Cognitive remediation/compensation;Neuromuscular education;Visual/perceptual remediation/compensation;Coping strategies training;Patient/family education;Passive range of motion;Psychosocial skills training;Energy conservation;Functional Mobility Training    Recommended Other Services  PT    Consulted and Agree with Plan of Care  Patient    Family Member Consulted  Daughter [name]       Patient will benefit from skilled therapeutic intervention in order to improve the following deficits and impairments:   Body Structure / Function / Physical Skills: UE functional use, Coordination, FMC, Dexterity, Strength, ROM Cognitive Skills: Attention, Memory, Emotional, Problem Solve, Safety Awareness     Visit Diagnosis: Muscle weakness  (generalized)  Other lack of coordination    Problem List Patient Active Problem List   Diagnosis Date Noted  . Sepsis ([place]) 10/18/2018    Harrel Carina, MS, OTR/L 04/28/2019, 2:06 PM  [place] MAIN [facility] SERVICES [address] [place], [place], [address] Phone: [phone]   Fax:  [phone]  Name: Kenneth Summers MRN: [id number] Date of Birth: [birth year]

## 2019-04-29 ENCOUNTER — Encounter: Payer: Self-pay | Admitting: Physical Therapy

## 2019-04-30 ENCOUNTER — Encounter: Payer: Self-pay | Admitting: Physical Therapy

## 2019-04-30 ENCOUNTER — Other Ambulatory Visit: Payer: Self-pay

## 2019-04-30 ENCOUNTER — Ambulatory Visit: Payer: Medicare Other | Admitting: Physical Therapy

## 2019-04-30 DIAGNOSIS — R278 Other lack of coordination: Secondary | ICD-10-CM

## 2019-04-30 DIAGNOSIS — M6281 Muscle weakness (generalized): Secondary | ICD-10-CM

## 2019-04-30 DIAGNOSIS — R262 Difficulty in walking, not elsewhere classified: Secondary | ICD-10-CM

## 2019-04-30 NOTE — Therapy (Signed)
[place] MAIN [facility] SERVICES [address] [place], [place], [address] Phone: [phone]   Fax:  [phone]  Physical Therapy Treatment  Patient Details  Name: Kenneth Summers MRN: [id number] Date of Birth: 05-Jun-1945 Referring Provider (PT): Dr. [name], Guy Begin   Encounter Date: 04/30/2019  PT End of Session - 04/30/19 1254    Visit Number  [id number]    Number of Visits  144    Date for PT Re-Evaluation  05/23/19    Authorization Type  Medicare reporting period starting 09/11/18    Authorization Time Period  goals last assessed 03/19/19    PT Start Time  1302    PT Stop Time  1345    PT Time Calculation (min)  43 min    Equipment Utilized During Treatment  Gait belt    Activity Tolerance  Patient tolerated treatment well;No increased pain    Behavior During Therapy  WFL for tasks assessed/performed       Past Medical History:  Diagnosis Date  . Actinic keratosis   . Alcohol abuse   . APS (antiphospholipid syndrome) ([place])   . Basal cell carcinoma   . BPH (benign prostatic hyperplasia)   . CAD (coronary artery disease)   . Cellulitis   . Cervical spinal stenosis   . Chronic osteomyelitis ([place])   . CKD (chronic kidney disease)   . CKD (chronic kidney disease)   . Clostridium difficile diarrhea   . Depression   . Diplopia   . Fatigue   . GERD (gastroesophageal reflux disease)   . Hyperlipemia   . Hypertension   . Hypovitaminosis D   . IBS (irritable bowel syndrome)   . 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 Insomnia   . Left lumbosacral radiculopathy   . Moderate obstructive sleep apnea   . Osteomyelitis of foot ([place])   . PAD (peripheral artery disease) ([place])   . Pruritus   . RA (rheumatoid arthritis) ([place])   . Radiculopathy   . Restless legs syndrome   . RLS (restless legs syndrome)   . SLE (systemic lupus erythematosus related syndrome) ([place])   . 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 Stroke  ([place])   . Toxic maculopathy of both eyes     Past Surgical History:  Procedure Laterality Date  . CARDIAC CATHETERIZATION    . CAROTID ENDARTERECTOMY    . CERVICAL LAMINECTOMY    . CORONARY ANGIOPLASTY    . FRACTURE SURGERY    . fusion C5-6-7    . HERNIA REPAIR    . KNEE ARTHROSCOPY    . TONSILLECTOMY      There were no vitals filed for this visit.  Subjective Assessment - 04/30/19 1326    Subjective  Patient reports doing fair. He reports having a good morning but admits he is fatigued today;    Patient is accompained by:  Family member    Pertinent History  Patient is a 74 y.o. male who presents to outpatient physical therapy with a referral for medical diagnosis of CVA. This patient's chief complaints consist of left hemiplegia and overall deconditioning leading to the following functional deficits: dependent for ADLs, IADLs, unable to transfer to car, dependent transfers at home with hoyer lift, unable to walk or stand without significant assistance, difficulty with W/C navigation..    Currently in Pain?  No/denies    Pain Onset  1 to 4 weeks ago    Multiple  Pain Sites  No            TREATMENT: Prior to transfersand in between transfers: Patient sittingin wheelchair: Instructed patient in LE strengthening exercise: -hip flexion march x15reps bilaterally, LLE only x10 reps;  -LAQ x15 reps bilaterallywith cues to isolate right/left leg to improve disassociation -finger flicks to relax UE and stretch hands x5 reps; -deep breathing with erect posture x5 reps with cues for proper positioning; Ptrequires verbal and visual cues to improve ROM; He doesexhibit improved ROMbilaterally with better motor control and LE positioning;  Following exercise: Transferred sit<>Stand in parallel bars (seat adjusted to 27 inch from floor height) x5 reps throughout session;He was able to transfersupervision withminVCs for forward weight shift for better trunk control and  transfer ability;  Standing in parallel bars: Standing,CGA to close supervision with min VCs for weight shift and increased extension for better stance control;  -Side/side weight shift withCGA,x10,withB rail assist and cues for neutral positioning; -RLE hip flexion march x15reps with min A to shift to [employer] therapist blocking LLE knee; -LLE hip flexion march 2x5Reps(partial ROM)with min A for increased ROM, patient able to hold self up with supervision with therapist assisting with LLE movement;     Sitting posture: Educated patient in sitting while in chair between standing with arms on thighs to challenge sitting balance, with cues for weight shift to neutral; Sitting with arms across chest:  Trunk rotation side side x4 reps with min A for balance and cues to right to midline to avoid falling backwards or leaning to left side;   Patient initially exhibits flexed posture with decreased hip extension; He was able to exhibit improved hip extension during subsequent transfers; Although he continues to require min A for safety and trunk control;  Patient required minA to scoot back in chair at end of session due to increased fatigue  Response to treatment: Toleratedfair. He does exhibit increased fatigue this session. With all transfers patient requiredminVCs for forward weight shift and to increase hip/knee extension in stance.He was able toexhibit improved AROM with seated exerciseand better motor control in standing.Hewas able to exhibit better LLE knee extension this session although is unable to achieve full terminal knee extension; Patient heavily fatigued at end of session.                      PT Education - 04/30/19 1254    Education Details  transfers/positioning, HEP    Person(s) Educated  Patient;Child(ren)    Methods  Explanation;Verbal cues    Comprehension  Verbalized understanding;Returned demonstration;Verbal cues  required;Need further instruction       PT Short Term Goals - 04/17/19 0902      PT SHORT TERM GOAL #1   Title  Be independent with initial home exercise program for self-management of symptoms.    Baseline  HEP to be given at second session; advice to practice unsupported sitting at home (06/19/2018); 07/08/18: Performing at home, 6/29: needs assistance but is doing exercise program regularly; 08/22/18: adherent;    Time  2    Period  Weeks    Status  Achieved    Target Date  06/26/18        PT Long Term Goals - 04/17/19 0902      PT LONG TERM GOAL #1   Title  Patient will complete all bed mobility with min A to improve functional independence for getting in and out of bed and adjusting in bed.  Baseline  max A - total A reported by family (06/19/2018); 07/08/18: still requires maxA, dtr reports improvement in rolling since starting therapy; 6/23: min A to supervision in clinic; not doing at home right; 8/5: Pt's daughter states that his rolling has improved, but still has difficulty with all other bed mobility; 10/23/18: pts daughter states that she is doing about 35% of the work if he is in proper position, he does uses the bed rails to help roll;  using Hoyer for coming to sit EOB; 10/12: pts daughter states that she is doing about 30% of the work if he is in proper position, he does uses the bed rails to help roll, but it is improving;  using Hoyer for coming to sit EOB, 10/29: min A for sit to sidelying, rolling supervision, able to transition sidelying to sit with HHA;    Time  12    Period  Weeks    Status  Achieved    Target Date  12/04/18      PT LONG TERM GOAL #2   Title  Patient will complete sit <> stand transfer chair to chair with Min A and LRAD to improve functional independence for household and community mobility.     Baseline  variable min-modA for STS, still requires elevated surface height to 27"    Time  12    Period  Weeks    Status  Partially Met    Target Date   05/23/19      PT LONG TERM GOAL #3   Title  Patient will navigate power w/c with min A x 100 feet to improve mobility for household and short community distances.    Baseline  required total A (06/12/2018); able to roll 20 feet with min A (06/20/2018); 07/08/18: Pt can go approximately 10' without assist per family;  10/23/2018: pt's daughter states that his power WC mobility has become 100% better with the new hand control, slowed speed, and with practice; 10/12: pt's daughter reports that he can perform household WC mobility and limited community ambulation with minA    Time  12    Period  Weeks    Status  Achieved    Target Date  05/23/19      PT LONG TERM GOAL #4   Title  Patient will complete car transfers W/C to family SUV with min A using LRAD to improve his abilty to participate in community activities.     Baseline  unable to complete car transfers at all (06/12/2018); unable to complete car transfers at all (06/19/2018).; 07/08/18: Unable to perform at this time; 09/11/18: unable to perform at this time; 10/23/2018: unable to complete car transfers at all; 10/12:  unable to complete car transfers at all, 10/29: unable; 01/13/19: unable; 2/11: min-mod A +2, 3/10: min A+1    Time  12    Period  Weeks    Status  Achieved    Target Date  05/23/19      PT LONG TERM GOAL #5   Title  Patient will ambulate at least 10 feet with min A using LRAD to improve mobility for household and community distances.    Baseline  unable to take steps (06/12/2018); able to shift R leg forward and back with max A and RW at edge of plinth (06/19/2018); 07/08/18: unable to ambulate at this time. 09/11/18: unable to perform at this time; 10/23/2018: unable to perform at this time; 10/12: unable to perform at this time, 10/29: unable at  this time; 01/13/19: unable at this time, 2/11: min A to step with RLE, unable to step with LLE, 3/10: able to take 2 steps in parallel bars with min A for safety;    Time  12    Period  Weeks    Status   Partially Met    Target Date  05/23/19      PT LONG TERM GOAL #6   Title  Patient will be able to safety navigate up/down ramp with power chair, exhibiting good safety awareness, mod I to safely enter/exit his home.    Baseline  2/11: supervision with intermittent min A    Time  12    Period  Weeks    Status  On-going    Target Date  05/23/19      PT LONG TERM GOAL #7   Title  Patient will increase functional reach test to >15 inches in sitting to exhibit improved sitting balance and positioning and reduce fall risk;    Baseline  07/30/18: 12 inches; 09/11/18: 10-12 inches; 10/23/18: 12-13 inch; 10/12: 14.5", 10/29: 15.5 inch    Time  4    Period  Weeks    Status  Achieved    Target Date  05/23/19      PT LONG TERM GOAL #8   Title  Patient will increase LLE quad strength to 3+/5 to improve functional strength for standing and mobility;    Baseline  10/28: 2+/5; 01/13/19: 2+/5, 2/11: 3-/5, 04/16/19: 3-/5    Time  12    Period  Weeks    Status  Partially Met    Target Date  05/23/19            Plan - 04/30/19 1452    Clinical Impression Statement  Patient motivated and participated well within session. He does report increased fatigue today requiring short seated rest breaks. Patient continues to be able to stand with parallel bars with supervision with good positioning and better LE muscle activation. He was able to exhibit better hip extension this session being able to stand with close supervision. Patient does require cues for breath support during exercise and to increase AROM with all exercise. He is able to exhibit better LLE muscle activation this session with better ROM. He would benefit from additional skilled PT intervention to improve strength, balance and mobility;    Comorbidities  Relevant past medical history and comorbidities include long term steroid use for lupus, CKD, chronic osteomyelitis, cervical spine stenosis, BPH, APS, alcohol abuse, peripheral artery disease,  depression, GERD, obstructive sleep apnea, HTN, IBS, IgA deficiency, lumbar radiculopathy, RA, Stroke, toxic maculopathy in both eyes, systemic lupus erythematosus related syndrome, cardiac catheterization, carotid endarterectomy, cervical laminectomy, coronary angioplasty, Fusion C5-C7, knee arthroplasty, lumbar surgery.    Rehab Potential  Fair    PT Frequency  3x / week    PT Duration  12 weeks    PT Treatment/Interventions  ADLs/Self Care Home Management;Electrical Stimulation;Moist Heat;Cryotherapy;Gait training;Stair training;Functional mobility training;Therapeutic exercise;Balance training;Neuromuscular re-education;Cognitive remediation;Patient/family education;Orthotic Fit/Training;Wheelchair mobility training;Manual techniques;Passive range of motion;Energy conservation;Joint Manipulations    PT Next Visit Plan  Work on strength, sitting/standing balance, functional activities such as rolling, bed mobility, transfers; caregiver training    PT Home Exercise Plan  no updates this session    Consulted and Agree with Plan of Care  Patient;Family member/caregiver    Family Member Consulted  daughter [name]       Patient will benefit from skilled therapeutic intervention in order to improve  the following deficits and impairments:  Abnormal gait, Decreased activity tolerance, Decreased cognition, Decreased endurance, Decreased knowledge of use of DME, Decreased range of motion, Decreased skin integrity, Decreased strength, Impaired perceived functional ability, Impaired sensation, Impaired UE functional use, Improper body mechanics, Pain, Cardiopulmonary status limiting activity, Decreased balance, Decreased coordination, Decreased mobility, Difficulty walking, Impaired tone, Postural dysfunction  Visit Diagnosis: Muscle weakness (generalized)  Other lack of coordination  Difficulty in walking, not elsewhere classified     Problem List Patient Active Problem List   Diagnosis Date Noted   . Sepsis ([place]) 10/18/2018    Erienne Spelman PT, DPT 04/30/2019, 2:54 PM  [place] MAIN [facility] SERVICES [address] [place], [place], [address] Phone: [phone]   Fax:  [phone]  Name: Kenneth Summers MRN: [id number] Date of Birth: 28-Apr-1945

## 2019-05-01 ENCOUNTER — Ambulatory Visit: Payer: Medicare Other | Admitting: Physical Therapy

## 2019-05-01 ENCOUNTER — Encounter: Payer: Self-pay | Admitting: Physical Therapy

## 2019-05-01 ENCOUNTER — Ambulatory Visit: Payer: Medicare Other | Admitting: Occupational Therapy

## 2019-05-01 ENCOUNTER — Encounter: Payer: Self-pay | Admitting: Occupational Therapy

## 2019-05-01 ENCOUNTER — Other Ambulatory Visit: Payer: Self-pay

## 2019-05-01 DIAGNOSIS — M6281 Muscle weakness (generalized): Secondary | ICD-10-CM

## 2019-05-01 DIAGNOSIS — R278 Other lack of coordination: Secondary | ICD-10-CM

## 2019-05-01 DIAGNOSIS — H547 Unspecified visual loss: Secondary | ICD-10-CM

## 2019-05-01 DIAGNOSIS — R296 Repeated falls: Secondary | ICD-10-CM

## 2019-05-01 DIAGNOSIS — R262 Difficulty in walking, not elsewhere classified: Secondary | ICD-10-CM

## 2019-05-01 NOTE — Therapy (Signed)
[place] MAIN [facility] SERVICES [address] [place], [place], [address] Phone: [phone]   Fax:  [phone]  Occupational Therapy Progress Note  Dates of reporting period  03/24/2019   to   05/01/2019  Patient Details  Name: Kenneth Summers MRN: [id number] Date of Birth: 1945/09/25 No data recorded  Encounter Date: 05/01/2019  OT End of Session - 05/01/19 1307    Visit Number  100    Number of Visits  157    Date for OT Re-Evaluation  06/30/19    Authorization Type  Progress report period starting 12/26/2018    OT Start Time  1300    OT Stop Time  1345    OT Time Calculation (min)  45 min    Activity Tolerance  Patient tolerated treatment well;Patient limited by fatigue    Behavior During Therapy  [facility] for tasks assessed/performed       Past Medical History:  Diagnosis Date  . Actinic keratosis   . Alcohol abuse   . APS (antiphospholipid syndrome) ([place])   . Basal cell carcinoma   . BPH (benign prostatic hyperplasia)   . CAD (coronary artery disease)   . Cellulitis   . Cervical spinal stenosis   . Chronic osteomyelitis ([place])   . CKD (chronic kidney disease)   . CKD (chronic kidney disease)   . Clostridium difficile diarrhea   . Depression   . Diplopia   . Fatigue   . GERD (gastroesophageal reflux disease)   . Hyperlipemia   . Hypertension   . Hypovitaminosis D   . IBS (irritable bowel syndrome)   . IgA deficiency ([place])   . Insomnia   . Left lumbosacral radiculopathy   . Moderate obstructive sleep apnea   . Osteomyelitis of foot ([place])   . PAD (peripheral artery disease) ([place])   . Pruritus   . RA (rheumatoid arthritis) ([place])   . Radiculopathy   . Restless legs syndrome   . RLS (restless legs syndrome)   . SLE (systemic lupus erythematosus related syndrome) ([place])   . 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 Stroke ([place])   . Toxic maculopathy of both eyes     Past Surgical History:   Procedure Laterality Date  . CARDIAC CATHETERIZATION    . CAROTID ENDARTERECTOMY    . CERVICAL LAMINECTOMY    . CORONARY ANGIOPLASTY    . FRACTURE SURGERY    . fusion C5-6-7    . HERNIA REPAIR    . KNEE ARTHROSCOPY    . TONSILLECTOMY      There were no vitals filed for this visit.  Subjective Assessment - 05/01/19 1306    Subjective   Pt. that he is feeling good today.    Patient is accompanied by:  Family member    Pertinent History  Pt. is a 74 y.o. 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 Pt. PMHx includes: Multiple Falls, Lupus, DJD, Renal Abscess, CVA. Pt. resides with his wife. Pt.'s wife and daughter assist with ADLs. Pt. has caregivers in  for 2 hours a day, 6 days a week. Pt. received Rehab services in acute care, at SNF for STR, and [place] services. Pt. is retired from [employer] for [employer] and [employer].    Patient Stated Goals  To regain use of his left UE, and do more for himself.    Currently in Pain?  No/denies  OT Treatment  Therapeutic activities:  Pt. Worked on navigating through [employer] following a Scientist, research (life sciences) pattern with the board placed at a vertical angle to encourage LUE reaching. Pt. Required extensive step-by-step cues for accuracy, and to use his left UE during the task.  Pt. With no reports of pain today. Pt. is making progress overall with his LUE ROM, and forward trunk flexion with cues. Pt. Is making steady progress with  UE dressing skills,however pt. Continues to require increased cues, and assist to donn the jacket in the correct direction, bring the jacket around his back, and shoulders, and manipulate the zipper. Pt. continues to require cues for left sided awareness, trunk control,  LUE strength for functional reaching, and coordination skills. Pt. continues to work on these skills in order to increase engagement of the LUE during ADLs, and IADL  tasks.                     OT Education - 05/01/19 1307    Education Details  Left UE functioning, left sided awareness.    Person(s) Educated  Patient    Methods  Explanation;Demonstration    Comprehension  Returned demonstration;Tactile cues required;Verbalized understanding;Need further instruction          OT Long Term Goals - 05/01/19 1309      OT LONG TERM GOAL #1   Title  Pt. will increase left shoulder flexion AROM by 10 degrees to access cabinet/shelf.    Baseline  Shoulder flexion:125. Pt. continues to work on reaching with increasing emphasis on flexing trunk to improve functional reach.    Time  12    Period  Weeks    Status  Partially Met    Target Date  06/30/19      OT LONG TERM GOAL #2   Title  Pt. will donn a shirt with Supervision.    Baseline  Pt. continues to require MinA donning a zip down jacket with consistent cues to initiate the correct direction of the jacket.    Time  12    Period  Weeks    Status  On-going    Target Date  06/30/19      OT LONG TERM GOAL #4   Title  Pt. will improve left grip strength by 10# to be able to open a jar/container.    Baseline  Pt. continues to progress with grip strength, and continues to work towards opening containers, and bottles.    Time  12    Period  Weeks    Status  On-going    Target Date  06/30/19      OT LONG TERM GOAL #5   Title  Pt. will improve left hand [facility] skills to be able to assist with buttoning/zipping.    Baseline  Pt. is improving with manipulating the zipper on his jacket, however continues to require practice. Pt. continues to work on improving bilateral [facility] skills for buttoning/zipping    Time  12    Period  Weeks    Status  On-going    Target Date  06/30/19      OT LONG TERM GOAL #6   Title  Pt. will demonstrate visual conmpensatory strategies for 100% of the time during ADLs    Baseline  Pt. requires cues for left sides awareness.    Time  12    Period  Weeks     Status  On-going    Target Date  06/30/19  OT LONG TERM GOAL #8   Title  Pt. will accurately identify potential safety hazard using good safety awareness, and judgement 100% for ADLs, and IADLs.    Baseline  Pt. requires cues    Time  12    Period  Weeks    Status  On-going    Target Date  06/30/19      OT LONG TERM GOAL  #9   TITLE  Pt. will  navigate his w/c around obstacles with Supervision and 100% accuracy    Baseline  Pt. continues to require cuing for obstacle on the left, however requires less cues.    Time  12    Period  Weeks    Status  On-going    Target Date  06/30/19      OT LONG TERM GOAL  #11   TITLE  Pt. will increase BUE strength by 24m grades in preparation for ADL transfers.    Baseline  Pt. continues to present with limited BUE strength    Time  12    Period  Weeks    Status  On-going    Target Date  06/30/19            Plan - 05/01/19 1308    Clinical Impression           OT Occupational Profile and History Pt. With no reports of pain today. Pt. is making progress overall with his LUE ROM, and forward trunk flexion with cues. Pt. Is making steady progress with  UE dressing skills,however pt. Continues to require increased cues, and assist to donn the jacket in the correct direction, bring the jacket around his back, and shoulders, and manipulate the zipper. Pt. continues to require cues for left sided awareness, trunk control,  LUE strength for functional reaching, and coordination skills. Pt. continues to work on these skills in order to increase engagement of the LUE during ADLs, and IADL tasks.  Problem Focused Assessment - Including review of records relating to presenting problem    Occupational performance deficits (Please refer to evaluation for details):  ADL's    Body Structure / Function / Physical Skills  UE functional use;Coordination;FMC;Dexterity;Strength;ROM    Cognitive Skills  Attention;Memory;Emotional;Problem  Solve;Safety Awareness    Rehab Potential  Good    Clinical Decision Making  Several treatment options, min-mod task modification necessary    Comorbidities Affecting Occupational Performance:  Presence of comorbidities impacting occupational performance    Comorbidities impacting occupational performance description:  Phyical, cognitive, visual,  medical comorbidities    Modification or Assistance to Complete Evaluation   No modification of tasks or assist necessary to complete eval    OT Frequency  2x / week    OT Duration  12 weeks    OT Treatment/Interventions  Self-care/ADL training;DME and/or AE instruction;Therapeutic exercise;Therapeutic activities;Moist Heat;Cognitive remediation/compensation;Neuromuscular education;Visual/perceptual remediation/compensation;Coping strategies training;Patient/family education;Passive range of motion;Psychosocial skills training;Energy conservation;Functional Mobility Training    Consulted and Agree with Plan of Care  Patient    Family Member Consulted  Daughter JMarcie Bal      Patient will benefit from skilled therapeutic intervention in order to improve the following deficits and impairments:   Body Structure / Function / Physical Skills: UE functional use, Coordination, FMC, Dexterity, Strength, ROM Cognitive Skills: Attention, Memory, Emotional, Problem Solve, Safety Awareness     Visit Diagnosis: Muscle weakness (generalized)  Vision impairment    Problem List Patient Active Problem List   Diagnosis Date Noted  . Sepsis ([place] 10/18/2018  Harrel Carina, MS, OT 05/01/2019, 1:18 PM  [place] MAIN [facility] SERVICES [address] [place], [place], [address] Phone: [phone]   Fax:  [phone]  Name: Kenneth Summers MRN: [id number] Date of Birth: 1945-03-01

## 2019-05-01 NOTE — Therapy (Signed)
[place] MAIN [facility] SERVICES [address] [place], [place], [address] Phone: [phone]   Fax:  [phone]  Physical Therapy Treatment  Patient Details  Name: Kenneth Summers MRN: [id number] Date of Birth: [birth year] Referring Provider (PT): Dr. [name], Guy Begin   Encounter Date: 05/01/2019  PT End of Session - 05/01/19 1257    Visit Number  [id number]    Number of Visits  144    Date for PT Re-Evaluation  05/23/19    Authorization Type  Medicare reporting period starting 09/11/18    Authorization Time Period  goals last assessed 03/19/19    PT Start Time  1345    PT Stop Time  1430    PT Time Calculation (min)  45 min    Equipment Utilized During Treatment  Gait belt    Activity Tolerance  Patient tolerated treatment well;No increased pain    Behavior During Therapy  WFL for tasks assessed/performed       Past Medical History:  Diagnosis Date  . Actinic keratosis   . Alcohol abuse   . APS (antiphospholipid syndrome) ([place])   . Basal cell carcinoma   . BPH (benign prostatic hyperplasia)   . CAD (coronary artery disease)   . Cellulitis   . Cervical spinal stenosis   . Chronic osteomyelitis ([place])   . CKD (chronic kidney disease)   . CKD (chronic kidney disease)   . Clostridium difficile diarrhea   . Depression   . Diplopia   . Fatigue   . GERD (gastroesophageal reflux disease)   . Hyperlipemia   . Hypertension   . Hypovitaminosis D   . IBS (irritable bowel syndrome)   . 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 Insomnia   . Left lumbosacral radiculopathy   . Moderate obstructive sleep apnea   . Osteomyelitis of foot ([place])   . PAD (peripheral artery disease) ([place])   . Pruritus   . RA (rheumatoid arthritis) ([place])   . Radiculopathy   . Restless legs syndrome   . RLS (restless legs syndrome)   . SLE (systemic lupus erythematosus related syndrome) ([place])   . 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 Stroke  ([place])   . Toxic maculopathy of both eyes     Past Surgical History:  Procedure Laterality Date  . CARDIAC CATHETERIZATION    . CAROTID ENDARTERECTOMY    . CERVICAL LAMINECTOMY    . CORONARY ANGIOPLASTY    . FRACTURE SURGERY    . fusion C5-6-7    . HERNIA REPAIR    . KNEE ARTHROSCOPY    . TONSILLECTOMY      There were no vitals filed for this visit.  Subjective Assessment - 05/01/19 1507    Subjective  Patient reports increased fatigue today. Denies any pain. Otherwise reports doing okay;    Patient is accompained by:  Family member    Pertinent History  Patient is a 74 y.o. male who presents to outpatient physical therapy with a referral for medical diagnosis of CVA. This patient's chief complaints consist of left hemiplegia and overall deconditioning leading to the following functional deficits: dependent for ADLs, IADLs, unable to transfer to car, dependent transfers at home with hoyer lift, unable to walk or stand without significant assistance, difficulty with W/C navigation..    Currently in Pain?  No/denies    Pain Onset  1 to 4 weeks ago    Multiple Pain Sites  No  TREATMENT: Prior to transfersand in between transfers: Patient sittingin wheelchair: Instructed patient in LE strengthening exercise: -hip flexion march x15reps bilaterally -finger flicks to relax UE and stretch hands x5 reps; -deep breathing with erect posture x5 reps with cues for proper positioning; Ptrequires verbal and visual cues to improve ROM; He doesexhibit improved ROMbilaterally with better motor control and LE positioning;  Following exercise: Transferred sit<>Stand in parallel bars (seat adjusted to 27 inch from floor height) x4 reps throughout session;He was able to transfersupervision withminVCs for forward weight shift for better trunk control and transfer ability;  Standing in parallel bars: Standing,CGA to close supervision with min VCs for weight shift and  increased extension for better stance control;  -Side/side weight shift withCGA,x10,withB rail assist and cues for neutral positioning; -RLE hip flexion march x15reps with min A to shift to [employer] therapist blocking LLE knee; -LLE hip flexion march x10Reps(partial ROM)with min A for increased ROM, patient able to hold self up with supervision with therapist assisting with LLE movement;    Patient initially exhibits flexed posture with decreased hip extension; He was able to exhibit improved hip extension during subsequent transfers; Although he continues to require min A for safety and trunk control;  Progressed activity to Gait training: Patient stood in parallel bars, supervision with CGA for steadying upon standing. Patient ambulated 6 steps each LE ( approximately 10 feet), required min A to advance LLE. He was able to advance RLE well with good control requiring intermittent LLE knee blocking. Patient does exhibit increased LLE knee extension in stance this session and was able to exhibit improved hip positioning with less posterior lean. He was unable to advance LLE independently due to weakness and imbalance;   Patient required minA to scoot back in chair at end of session due to increased fatigue  Response to treatment: [place]. Patient pleased with his ability to walk in parallel bars. He exhibits improved LE ROM this session with better LLE knee extension in stance. He also exhibits better trunk control with less posterior lean and better standing balance.                    PT Education - 05/01/19 1257    Education Details  transfers/positioning/HEP    Person(s) Educated  Patient;Child(ren)    Methods  Explanation;Verbal cues    Comprehension  Verbalized understanding;Returned demonstration;Verbal cues required;Need further instruction       PT Short Term Goals - 04/17/19 0902      PT SHORT TERM GOAL #1   Title  Be independent with  initial home exercise program for self-management of symptoms.    Baseline  HEP to be given at second session; advice to practice unsupported sitting at home (06/19/2018); 07/08/18: Performing at home, 6/29: needs assistance but is doing exercise program regularly; 08/22/18: adherent;    Time  2    Period  Weeks    Status  Achieved    Target Date  06/26/18        PT Long Term Goals - 04/17/19 0902      PT LONG TERM GOAL #1   Title  Patient will complete all bed mobility with min A to improve functional independence for getting in and out of bed and adjusting in bed.     Baseline  max A - total A reported by family (06/19/2018); 07/08/18: still requires maxA, dtr reports improvement in rolling since starting therapy; 6/23: min A to supervision in clinic; not doing at home  right; 8/5: Pt's daughter states that his rolling has improved, but still has difficulty with all other bed mobility; 10/23/18: pts daughter states that she is doing about 35% of the work if he is in proper position, he does uses the bed rails to help roll;  using Harrel Lemon for coming to sit EOB; 10/12: pts daughter states that she is doing about 30% of the work if he is in proper position, he does uses the bed rails to help roll, but it is improving;  using Hoyer for coming to sit EOB, 10/29: min A for sit to sidelying, rolling supervision, able to transition sidelying to sit with HHA;    Time  12    Period  Weeks    Status  Achieved    Target Date  12/04/18      PT LONG TERM GOAL #2   Title  Patient will complete sit <> stand transfer chair to chair with Min A and LRAD to improve functional independence for household and community mobility.     Baseline  variable min-modA for STS, still requires elevated surface height to 27"    Time  12    Period  Weeks    Status  Partially Met    Target Date  05/23/19      PT LONG TERM GOAL #3   Title  Patient will navigate power w/c with min A x 100 feet to improve mobility for household and  short community distances.    Baseline  required total A (06/12/2018); able to roll 20 feet with min A (06/20/2018); 07/08/18: Pt can go approximately 10' without assist per family;  10/23/2018: pt's daughter states that his power WC mobility has become 100% better with the new hand control, slowed speed, and with practice; 10/12: pt's daughter reports that he can perform household WC mobility and limited community ambulation with minA    Time  12    Period  Weeks    Status  Achieved    Target Date  05/23/19      PT LONG TERM GOAL #4   Title  Patient will complete car transfers W/C to family SUV with min A using LRAD to improve his abilty to participate in community activities.     Baseline  unable to complete car transfers at all (06/12/2018); unable to complete car transfers at all (06/19/2018).; 07/08/18: Unable to perform at this time; 09/11/18: unable to perform at this time; 10/23/2018: unable to complete car transfers at all; 10/12:  unable to complete car transfers at all, 10/29: unable; 01/13/19: unable; 2/11: min-mod A +2, 3/10: min A+1    Time  12    Period  Weeks    Status  Achieved    Target Date  05/23/19      PT LONG TERM GOAL #5   Title  Patient will ambulate at least 10 feet with min A using LRAD to improve mobility for household and community distances.    Baseline  unable to take steps (06/12/2018); able to shift R leg forward and back with max A and RW at edge of plinth (06/19/2018); 07/08/18: unable to ambulate at this time. 09/11/18: unable to perform at this time; 10/23/2018: unable to perform at this time; 10/12: unable to perform at this time, 10/29: unable at this time; 01/13/19: unable at this time, 2/11: min A to step with RLE, unable to step with LLE, 3/10: able to take 2 steps in parallel bars with min A for safety;  Time  12    Period  Weeks    Status  Partially Met    Target Date  05/23/19      PT LONG TERM GOAL #6   Title  Patient will be able to safety navigate up/down ramp with  power chair, exhibiting good safety awareness, mod I to safely enter/exit his home.    Baseline  2/11: supervision with intermittent min A    Time  12    Period  Weeks    Status  On-going    Target Date  05/23/19      PT LONG TERM GOAL #7   Title  Patient will increase functional reach test to >15 inches in sitting to exhibit improved sitting balance and positioning and reduce fall risk;    Baseline  07/30/18: 12 inches; 09/11/18: 10-12 inches; 10/23/18: 12-13 inch; 10/12: 14.5", 10/29: 15.5 inch    Time  4    Period  Weeks    Status  Achieved    Target Date  05/23/19      PT LONG TERM GOAL #8   Title  Patient will increase LLE quad strength to 3+/5 to improve functional strength for standing and mobility;    Baseline  10/28: 2+/5; 01/13/19: 2+/5, 2/11: 3-/5, 04/16/19: 3-/5    Time  12    Period  Weeks    Status  Partially Met    Target Date  05/23/19            Plan - 05/01/19 1508    Clinical Impression Statement  Patient motivated and participated well within session. He continues to progress in LE AROM with better LLE movement. He does require verbal and visual cues to increase ROM for strengthening. Patient less short of breath and less fatigued, being able to stand for longer period of time this session. progressed standing exercise to walking in parallel bars with min A +1 with chair follow. Patient able to step well with RLE but does require assistance to advance LLE. He would benefit from additional skilled PT intervention to improve strength, balance and mobility;    Comorbidities  Relevant past medical history and comorbidities include long term steroid use for lupus, CKD, chronic osteomyelitis, cervical spine stenosis, BPH, APS, alcohol abuse, peripheral artery disease, depression, GERD, obstructive sleep apnea, HTN, IBS, IgA deficiency, lumbar radiculopathy, RA, Stroke, toxic maculopathy in both eyes, systemic lupus erythematosus related syndrome, cardiac catheterization,  carotid endarterectomy, cervical laminectomy, coronary angioplasty, Fusion C5-C7, knee arthroplasty, lumbar surgery.    Rehab Potential  Fair    PT Frequency  3x / week    PT Duration  12 weeks    PT Treatment/Interventions  ADLs/Self Care Home Management;Electrical Stimulation;Moist Heat;Cryotherapy;Gait training;Stair training;Functional mobility training;Therapeutic exercise;Balance training;Neuromuscular re-education;Cognitive remediation;Patient/family education;Orthotic Fit/Training;Wheelchair mobility training;Manual techniques;Passive range of motion;Energy conservation;Joint Manipulations    PT Next Visit Plan  Work on strength, sitting/standing balance, functional activities such as rolling, bed mobility, transfers; caregiver training    PT Home Exercise Plan  no updates this session    Consulted and Agree with Plan of Care  Patient;Family member/caregiver    Family Member Consulted  daughter [name]       Patient will benefit from skilled therapeutic intervention in order to improve the following deficits and impairments:  Abnormal gait, Decreased activity tolerance, Decreased cognition, Decreased endurance, Decreased knowledge of use of DME, Decreased range of motion, Decreased skin integrity, Decreased strength, Impaired perceived functional ability, Impaired sensation, Impaired UE functional use, Improper body mechanics,  Pain, Cardiopulmonary status limiting activity, Decreased balance, Decreased coordination, Decreased mobility, Difficulty walking, Impaired tone, Postural dysfunction  Visit Diagnosis: Muscle weakness (generalized)  Other lack of coordination  Difficulty in walking, not elsewhere classified  Repeated falls     Problem List Patient Active Problem List   Diagnosis Date Noted  . Sepsis ([place]) 10/18/2018    Kaliah Haddaway PT, DPT 05/01/2019, 3:09 PM  [place] MAIN [facility] SERVICES [address] [place], [place],  [address] Phone: [phone]   Fax:  [phone]  Name: Kenneth Summers MRN: [id number] Date of Birth: 22-Dec-1945

## 2019-05-05 ENCOUNTER — Encounter: Payer: Self-pay | Admitting: Occupational Therapy

## 2019-05-05 ENCOUNTER — Encounter: Payer: Self-pay | Admitting: Physical Therapy

## 2019-05-05 ENCOUNTER — Ambulatory Visit: Payer: Medicare Other | Admitting: Physical Therapy

## 2019-05-05 ENCOUNTER — Other Ambulatory Visit: Payer: Self-pay

## 2019-05-05 ENCOUNTER — Ambulatory Visit: Payer: Medicare Other | Admitting: Occupational Therapy

## 2019-05-05 DIAGNOSIS — M6281 Muscle weakness (generalized): Secondary | ICD-10-CM | POA: Diagnosis not present

## 2019-05-05 DIAGNOSIS — R262 Difficulty in walking, not elsewhere classified: Secondary | ICD-10-CM

## 2019-05-05 DIAGNOSIS — R296 Repeated falls: Secondary | ICD-10-CM

## 2019-05-05 DIAGNOSIS — R278 Other lack of coordination: Secondary | ICD-10-CM

## 2019-05-05 NOTE — Therapy (Signed)
[place] MA[place] [facility] SERVICES [address] [place], [place], [address] Phone: [phone]   Fax:  [phone]  Physical Therapy Treatment  Patient Details  Name: Kenneth Summers MRN: [id number] Date of Birth: 03/28/1945 Referring Provider (PT): Dr. [name], Guy Begin   Encounter Date: 05/05/2019  PT End of Session - 05/05/19 1258    Visit Number  118    Number of Visits  144    Date for PT Re-Evaluation  05/23/19    Authorization Time Period  goals last assessed 03/19/19    PT Start Time  1302    PT Stop Time  1345    PT Time Calculation (min)  43 min    Equipment Utilized During Treatment  Gait belt    Activity Tolerance  Patient tolerated treatment well;No increased pain    Behavior During Therapy  WFL for tasks assessed/performed       Past Medical History:  Diagnosis Date  . Actinic keratosis   . Alcohol abuse   . APS (antiphospholipid syndrome) ([place])   . Basal cell carcinoma   . BPH (benign prostatic hyperplasia)   . CAD (coronary artery disease)   . Cellulitis   . Cervical spinal stenosis   . Chronic osteomyelitis ([place])   . CKD (chronic kidney disease)   . CKD (chronic kidney disease)   . Clostridium difficile diarrhea   . Depression   . Diplopia   . Fatigue   . GERD (gastroesophageal reflux disease)   . Hyperlipemia   . Hypertension   . Hypovitaminosis D   . IBS (irritable bowel syndrome)   . 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 Insomnia   . Left lumbosacral radiculopathy   . Moderate obstructive sleep apnea   . Osteomyelitis of foot ([place])   . PAD (peripheral artery disease) ([place])   . Pruritus   . RA (rheumatoid arthritis) ([place])   . Radiculopathy   . Restless legs syndrome   . RLS (restless legs syndrome)   . SLE (systemic lupus erythematosus related syndrome) ([place])   . 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 Stroke ([place])   . Toxic maculopathy of both eyes     Past Surgical  History:  Procedure Laterality Date  . CARDIAC CATHETERIZATION    . CAROTID ENDARTERECTOMY    . CERVICAL LAMINECTOMY    . CORONARY ANGIOPLASTY    . FRACTURE SURGERY    . fusion C5-6-7    . HERNIA REPAIR    . KNEE ARTHROSCOPY    . TONSILLECTOMY      There were no vitals filed for this visit.  Subjective Assessment - 05/05/19 1317    Subjective  Patient reports some fatigue today, otherwise doing well; Denies any pain;    Patient is accompained by:  Family member    Pertinent History  Patient is a 74 y.o. male who presents to outpatient physical therapy with a referral for medical diagnosis of CVA. This patient's chief complaints consist of left hemiplegia and overall deconditioning leading to the following functional deficits: dependent for ADLs, IADLs, unable to transfer to car, dependent transfers at home with hoyer lift, unable to walk or stand without significant assistance, difficulty with W/C navigation..    Currently in Pain?  No/denies    Pain Onset  1 to 4 weeks ago    Multiple Pain Sites  No  TREATMENT: Prior to transfersand in between transfers: Patient sittingin wheelchair: Instructed patient in LE strengthening exercise: -hip flexion march x15reps bilaterally -LAQ x10 reps bilaterally with visual cues to increase ROM especially on LLE;  -finger flicks to relax UE and stretch hands x5 reps; -deep breathing with erect posture x5 reps with cues for proper positioning; Ptrequires verbal and visual cues to improve ROM; He doesexhibit improved ROMbilaterally with better motor control and LE positioning;  Following exercise: Transferred sit<>Stand in parallel bars (seat adjusted to 27 inch from floor height) x4reps throughout session;He was able to transfersupervision withminVCs for forward weight shift for better trunk control and transfer ability;  Standing in parallel bars: Standing,CGA to close supervision with min VCs for weight  shift and increased extension for better stance control;  -Mini squat with B rail assist x10 reps with cues to increase knee extension in BLE between repetitions;  -Side/side weight shift withCGA,x10,withB rail assist and cues for neutral positioning; -RLE hip flexion march x15reps with min A to shift to [employer] therapist blocking LLE knee; -LLE hip flexion marchx10Reps(partial ROM)with min A forincreased ROM, patient able to hold self up with supervision with therapist assisting with LLE movement;   Patient initially exhibits flexed posture with decreased hip extension; He was able to exhibit improved hip extension during subsequent transfers; Although he continues to require min A for safety and trunk control;  Progressed activity to Gait training: Patient stood in parallel bars, supervision with CGA for steadying upon standing. Patient ambulated 2 steps each LE ( approximately 2 feet) x2 sets, required min A to advance LLE. He was able to advance RLE well with good control requiring intermittent LLE knee blocking. Patient does exhibit increased LLE knee extension in stance this session and was able to exhibit improved hip positioning with less posterior lean. He was able to advance LLE partially x1 step but then requiring min A for advancing LLE for 2nd step due to fatigue; Patient able to stand longer time and work on proper weight shift and body mechanics for better gait techniques;  Patient required minA to scoot back in chair at end of session due to increased fatigue  Response to treatment: [place]. Patient pleased with his ability to walk in parallel bars. He exhibits improved LE ROM this session with better LLE knee extension in stance. He also exhibits better trunk control with less posterior lean and better standing balance.                       PT Education - 05/05/19 1258    Education Details  transfers/positioning, HEP    Person(s)  Educated  Patient;Child(ren)    Methods  Explanation;Verbal cues    Comprehension  Verbalized understanding;Returned demonstration;Verbal cues required;Need further instruction       PT Short Term Goals - 04/17/19 0902      PT SHORT TERM GOAL #1   Title  Be independent with initial home exercise program for self-management of symptoms.    Baseline  HEP to be given at second session; advice to practice unsupported sitting at home (06/19/2018); 07/08/18: Performing at home, 6/29: needs assistance but is doing exercise program regularly; 08/22/18: adherent;    Time  2    Period  Weeks    Status  Achieved    Target Date  06/26/18        PT Long Term Goals - 04/17/19 0902      PT LONG TERM GOAL #1  Title  Patient will complete all bed mobility with min A to improve functional independence for getting in and out of bed and adjusting in bed.     Baseline  max A - total A reported by family (06/19/2018); 07/08/18: still requires maxA, dtr reports improvement in rolling since starting therapy; 6/23: min A to supervision in clinic; not doing at home right; 8/5: Pt's daughter states that his rolling has improved, but still has difficulty with all other bed mobility; 10/23/18: pts daughter states that she is doing about 35% of the work if he is in proper position, he does uses the bed rails to help roll;  using Hoyer for coming to sit EOB; 10/12: pts daughter states that she is doing about 30% of the work if he is in proper position, he does uses the bed rails to help roll, but it is improving;  using Hoyer for coming to sit EOB, 10/29: min A for sit to sidelying, rolling supervision, able to transition sidelying to sit with HHA;    Time  12    Period  Weeks    Status  Achieved    Target Date  12/04/18      PT LONG TERM GOAL #2   Title  Patient will complete sit <> stand transfer chair to chair with Min A and LRAD to improve functional independence for household and community mobility.     Baseline   variable min-modA for STS, still requires elevated surface height to 27"    Time  12    Period  Weeks    Status  Partially Met    Target Date  05/23/19      PT LONG TERM GOAL #3   Title  Patient will navigate power w/c with min A x 100 feet to improve mobility for household and short community distances.    Baseline  required total A (06/12/2018); able to roll 20 feet with min A (06/20/2018); 07/08/18: Pt can go approximately 10' without assist per family;  10/23/2018: pt's daughter states that his power WC mobility has become 100% better with the new hand control, slowed speed, and with practice; 10/12: pt's daughter reports that he can perform household WC mobility and limited community ambulation with minA    Time  12    Period  Weeks    Status  Achieved    Target Date  05/23/19      PT LONG TERM GOAL #4   Title  Patient will complete car transfers W/C to family SUV with min A using LRAD to improve his abilty to participate in community activities.     Baseline  unable to complete car transfers at all (06/12/2018); unable to complete car transfers at all (06/19/2018).; 07/08/18: Unable to perform at this time; 09/11/18: unable to perform at this time; 10/23/2018: unable to complete car transfers at all; 10/12:  unable to complete car transfers at all, 10/29: unable; 01/13/19: unable; 2/11: min-mod A +2, 3/10: min A+1    Time  12    Period  Weeks    Status  Achieved    Target Date  05/23/19      PT LONG TERM GOAL #5   Title  Patient will ambulate at least 10 feet with min A using LRAD to improve mobility for household and community distances.    Baseline  unable to take steps (06/12/2018); able to shift R leg forward and back with max A and RW at edge of plinth (06/19/2018); 07/08/18:  unable to ambulate at this time. 09/11/18: unable to perform at this time; 10/23/2018: unable to perform at this time; 10/12: unable to perform at this time, 10/29: unable at this time; 01/13/19: unable at this time, 2/11: min A to  step with RLE, unable to step with LLE, 3/10: able to take 2 steps in parallel bars with min A for safety;    Time  12    Period  Weeks    Status  Partially Met    Target Date  05/23/19      PT LONG TERM GOAL #6   Title  Patient will be able to safety navigate up/down ramp with power chair, exhibiting good safety awareness, mod I to safely enter/exit his home.    Baseline  2/11: supervision with intermittent min A    Time  12    Period  Weeks    Status  On-going    Target Date  05/23/19      PT LONG TERM GOAL #7   Title  Patient will increase functional reach test to >15 inches in sitting to exhibit improved sitting balance and positioning and reduce fall risk;    Baseline  07/30/18: 12 inches; 09/11/18: 10-12 inches; 10/23/18: 12-13 inch; 10/12: 14.5", 10/29: 15.5 inch    Time  4    Period  Weeks    Status  Achieved    Target Date  05/23/19      PT LONG TERM GOAL #8   Title  Patient will increase LLE quad strength to 3+/5 to improve functional strength for standing and mobility;    Baseline  10/28: 2+/5; 01/13/19: 2+/5, 2/11: 3-/5, 04/16/19: 3-/5    Time  12    Period  Weeks    Status  Partially Met    Target Date  05/23/19            Plan - 05/05/19 1329    Clinical Impression Statement  Patient motivated and participated well within session. He does exhibit increased fatigue this session requiring increased visual/verbal cues for proper positioning and exercise technique. He was able to exhibit better standing position this session with increased repetition. Patient would benefit from additional skilled PT intervention to improve strength, balance and mobility;    Comorbidities  Relevant past medical history and comorbidities include long term steroid use for lupus, CKD, chronic osteomyelitis, cervical spine stenosis, BPH, APS, alcohol abuse, peripheral artery disease, depression, GERD, obstructive sleep apnea, HTN, IBS, IgA deficiency, lumbar radiculopathy, RA, Stroke, toxic  maculopathy in both eyes, systemic lupus erythematosus related syndrome, cardiac catheterization, carotid endarterectomy, cervical laminectomy, coronary angioplasty, Fusion C5-C7, knee arthroplasty, lumbar surgery.    Rehab Potential  Fair    PT Frequency  3x / week    PT Duration  12 weeks    PT Treatment/Interventions  ADLs/Self Care Home Management;Electrical Stimulation;Moist Heat;Cryotherapy;Gait training;Stair training;Functional mobility training;Therapeutic exercise;Balance training;Neuromuscular re-education;Cognitive remediation;Patient/family education;Orthotic Fit/Training;Wheelchair mobility training;Manual techniques;Passive range of motion;Energy conservation;Joint Manipulations    PT Next Visit Plan  Work on strength, sitting/standing balance, functional activities such as rolling, bed mobility, transfers; caregiver training    PT Home Exercise Plan  no updates this session    Consulted and Agree with Plan of Care  Patient;Family member/caregiver    Family Member Consulted  daughter [name]       Patient will benefit from skilled therapeutic intervention in order to improve the following deficits and impairments:  Abnormal gait, Decreased activity tolerance, Decreased cognition, Decreased endurance, Decreased knowledge of  use of DME, Decreased range of motion, Decreased skin integrity, Decreased strength, Impaired perceived functional ability, Impaired sensation, Impaired UE functional use, Improper body mechanics, Pain, Cardiopulmonary status limiting activity, Decreased balance, Decreased coordination, Decreased mobility, Difficulty walking, Impaired tone, Postural dysfunction  Visit Diagnosis: Muscle weakness (generalized)  Other lack of coordination  Difficulty in walking, not elsewhere classified  Repeated falls     Problem List Patient Active Problem List   Diagnosis Date Noted  . Sepsis ([place]) 10/18/2018    Tylisa Alcivar PT, DPT 05/05/2019, 1:49 PM  [place] MAIN [facility] SERVICES [address] [place], [place], [address] Phone: [phone]   Fax:  [phone]  Name: Kenneth Summers MRN: [id number] Date of Birth: [birth year]

## 2019-05-05 NOTE — Therapy (Signed)
[place] MAIN [facility] SERVICES [address] [place], [place], [address] Phone: [phone]   Fax:  [phone]  Occupational Therapy Treatment  Patient Details  Name: Kenneth Summers MRN: [id number] Date of Birth: October 13, 1945 No data recorded  Encounter Date: 05/05/2019  OT End of Session - 05/05/19 1351    Visit Number  101    Number of Visits  157    Date for OT Re-Evaluation  06/30/19    Authorization Type  Progress report period starting 12/26/2018    OT Start Time  1349    OT Stop Time  1430    OT Time Calculation (min)  41 min    Activity Tolerance  Patient tolerated treatment well;Patient limited by fatigue    Behavior During Therapy  [facility] for tasks assessed/performed       Past Medical History:  Diagnosis Date  . Actinic keratosis   . Alcohol abuse   . APS (antiphospholipid syndrome) ([place])   . Basal cell carcinoma   . BPH (benign prostatic hyperplasia)   . CAD (coronary artery disease)   . Cellulitis   . Cervical spinal stenosis   . Chronic osteomyelitis ([place])   . CKD (chronic kidney disease)   . CKD (chronic kidney disease)   . Clostridium difficile diarrhea   . Depression   . Diplopia   . Fatigue   . GERD (gastroesophageal reflux disease)   . Hyperlipemia   . Hypertension   . Hypovitaminosis D   . IBS (irritable bowel syndrome)   . 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 Insomnia   . Left lumbosacral radiculopathy   . Moderate obstructive sleep apnea   . Osteomyelitis of foot ([place])   . PAD (peripheral artery disease) ([place])   . Pruritus   . RA (rheumatoid arthritis) ([place])   . Radiculopathy   . Restless legs syndrome   . RLS (restless legs syndrome)   . SLE (systemic lupus erythematosus related syndrome) ([place])   . 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 Stroke ([place])   . Toxic maculopathy of both eyes     Past Surgical History:  Procedure Laterality Date  . CARDIAC CATHETERIZATION    .  CAROTID ENDARTERECTOMY    . CERVICAL LAMINECTOMY    . CORONARY ANGIOPLASTY    . FRACTURE SURGERY    . fusion C5-6-7    . HERNIA REPAIR    . KNEE ARTHROSCOPY    . TONSILLECTOMY      There were no vitals filed for this visit.  Subjective Assessment - 05/05/19 1350    Subjective   Pt. that he is feeling good today.    Patient is accompanied by:  Family member    Pertinent History  Pt. is a 74 y.o. 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 Pt. PMHx includes: Multiple Falls, Lupus, DJD, Renal Abscess, CVA. Pt. resides with his wife. Pt.'s wife and daughter assist with ADLs. Pt. has caregivers in  for 2 hours a day, 6 days a week. Pt. received Rehab services in acute care, at SNF for STR, and [place] services. Pt. is retired from [employer] for [employer] and [employer].    Currently in Pain?  No/denies      OT TREATMENT    Selfcare:  Pt. worked on UE dressing tasks using hemi-dressing techniques. Pt. requiredlesscues forinitiatingposition of the sweatshirtwhenpreparing todonn a zip down sweatshirt over  the LUE. Pt. requiredcues to thread the LUE with the right, assist to bring the jacket around his back, and reposition the jacket to prepare for zipping.No assist needed to align the zipper in his hands x's3 reps.    Therapeutic Activities:  Pt. worked on Financial risk analyst PCP pipe designs following simple design patterns. Pt. required increased time to complete, verbal cues, visual cues to locate his position on the design pattern, and assist to connect to the pipe pieces in the correct direction. Pt. Required moderate cues to engage his LUE during the task today.   Pt.reports being very fatigued today following PT. Pt. required increased time, consistent verbal cues for each step, and assist to to complete simple tasks following set design patterns. Pt. Continues to make steady progress with UE dressing skills,howevercontinues to require  increased cues, and assist to donn the jacket in the correct direction, bring the jacket around his back, and shoulders, and manipulate the zipper. Pt. continues to require cues for left sided awareness, trunk control, engaging his LUE, motor, and coordination skills. Pt. continues to work on these skills in order to increase engagement of the LUE during ADLs, and IADL tasks.                    OT Education - 05/05/19 1350    Education Details  Left UE functioning, left sided awareness.    Person(s) Educated  Patient    Methods  Explanation;Demonstration    Comprehension  Returned demonstration;Tactile cues required;Verbalized understanding;Need further instruction          OT Long Term Goals - 05/01/19 1309      OT LONG TERM GOAL #1   Title  Pt. will increase left shoulder flexion AROM by 10 degrees to access cabinet/shelf.    Baseline  Shoulder flexion:125. Pt. continues to work on reaching with increasing emphasis on flexing trunk to improve functional reach.    Time  12    Period  Weeks    Status  Partially Met    Target Date  06/30/19      OT LONG TERM GOAL #2   Title  Pt. will donn a shirt with Supervision.    Baseline  Pt. continues to require MinA donning a zip down jacket with consistent cues to initiate the correct direction of the jacket.    Time  12    Period  Weeks    Status  On-going    Target Date  06/30/19      OT LONG TERM GOAL #4   Title  Pt. will improve left grip strength by 10# to be able to open a jar/container.    Baseline  Pt. continues to progress with grip strength, and continues to work towards opening containers, and bottles.    Time  12    Period  Weeks    Status  On-going    Target Date  06/30/19      OT LONG TERM GOAL #5   Title  Pt. will improve left hand [facility] skills to be able to assist with buttoning/zipping.    Baseline  Pt. is improving with manipulating the zipper on his jacket, however continues to require practice.  Pt. continues to work on improving bilateral [facility] skills for buttoning/zipping    Time  12    Period  Weeks    Status  On-going    Target Date  06/30/19      OT LONG TERM GOAL #6   Title  Pt. will demonstrate visual conmpensatory strategies for 100% of the time during ADLs    Baseline  Pt. requires cues for left sides awareness.    Time  12    Period  Weeks    Status  On-going    Target Date  06/30/19      OT LONG TERM GOAL #8   Title  Pt. will accurately identify potential safety hazard using good safety awareness, and judgement 100% for ADLs, and IADLs.    Baseline  Pt. requires cues    Time  12    Period  Weeks    Status  On-going    Target Date  06/30/19      OT LONG TERM GOAL  #9   TITLE  Pt. will  navigate his w/c around obstacles with Supervision and 100% accuracy    Baseline  Pt. continues to require cuing for obstacle on the left, however requires less cues.    Time  12    Period  Weeks    Status  On-going    Target Date  06/30/19      OT LONG TERM GOAL  #11   TITLE  Pt. will increase BUE strength by 2mm grades in preparation for ADL transfers.    Baseline  Pt. continues to present with limited BUE strength    Time  12    Period  Weeks    Status  On-going    Target Date  06/30/19            Plan - 05/05/19 1352    Clinical Impression Statement  p    OT Occupational Profile and History  Problem Focused Assessment - Including review of records relating to presenting problem    Occupational performance deficits (Please refer to evaluation for details):  ADL's    Body Structure / Function / Physical Skills  UE functional use;Coordination;FMC;Dexterity;Strength;ROM    Cognitive Skills  Attention;Memory;Emotional;Problem Solve;Safety Awareness    Rehab Potential  Good    Clinical Decision Making  Several treatment options, min-mod task modification necessary    Comorbidities Affecting Occupational Performance:  Presence of comorbidities impacting occupational  performance    Comorbidities impacting occupational performance description:  Phyical, cognitive, visual,  medical comorbidities    Modification or Assistance to Complete Evaluation   No modification of tasks or assist necessary to complete eval    OT Frequency  2x / week    OT Duration  12 weeks    OT Treatment/Interventions  Self-care/ADL training;DME and/or AE instruction;Therapeutic exercise;Therapeutic activities;Moist Heat;Cognitive remediation/compensation;Neuromuscular education;Visual/perceptual remediation/compensation;Coping strategies training;Patient/family education;Passive range of motion;Psychosocial skills training;Energy conservation;Functional Mobility Training    Consulted and Agree with Plan of Care  Patient       Patient will benefit from skilled therapeutic intervention in order to improve the following deficits and impairments:   Body Structure / Function / Physical Skills: UE functional use, Coordination, FMC, Dexterity, Strength, ROM Cognitive Skills: Attention, Memory, Emotional, Problem Solve, Safety Awareness     Visit Diagnosis: Muscle weakness (generalized)  Other lack of coordination    Problem List Patient Active Problem List   Diagnosis Date Noted  . Sepsis ([facility]) 10/18/2018      05/05/2019, 1:56 PM   [facility] MA[facility] REHAB SERVICES [address] [place], [place], [address] Phone: [phone]   Fax:  [phone]  Name: Kenneth Summers MRN: [id number] Date of Birth: 05/01/1945 

## 2019-05-07 ENCOUNTER — Encounter: Payer: Self-pay | Admitting: Physical Therapy

## 2019-05-07 ENCOUNTER — Ambulatory Visit: Payer: Medicare Other | Admitting: Physical Therapy

## 2019-05-07 ENCOUNTER — Other Ambulatory Visit: Payer: Self-pay

## 2019-05-07 DIAGNOSIS — R262 Difficulty in walking, not elsewhere classified: Secondary | ICD-10-CM

## 2019-05-07 DIAGNOSIS — M6281 Muscle weakness (generalized): Secondary | ICD-10-CM | POA: Diagnosis not present

## 2019-05-07 DIAGNOSIS — R278 Other lack of coordination: Secondary | ICD-10-CM

## 2019-05-07 DIAGNOSIS — R296 Repeated falls: Secondary | ICD-10-CM

## 2019-05-07 NOTE — Therapy (Signed)
[place] MAIN [facility] SERVICES [address] [place], [place], [address] Phone: [phone]   Fax:  [phone]  Physical Therapy Treatment  Patient Details  Name: Kenneth Summers MRN: [id number] Date of Birth: 1945-06-08 Referring Provider (PT): Dr. [name], Guy Begin   Encounter Date: 05/07/2019  PT End of Session - 05/07/19 1257    Visit Number  [id number]    Number of Visits  144    Date for PT Re-Evaluation  05/23/19    Authorization Time Period  goals last assessed 03/19/19    PT Start Time  1302    PT Stop Time  1345    PT Time Calculation (min)  43 min    Equipment Utilized During Treatment  Gait belt    Activity Tolerance  Patient tolerated treatment well;No increased pain    Behavior During Therapy  WFL for tasks assessed/performed       Past Medical History:  Diagnosis Date  . Actinic keratosis   . Alcohol abuse   . APS (antiphospholipid syndrome) ([place])   . Basal cell carcinoma   . BPH (benign prostatic hyperplasia)   . CAD (coronary artery disease)   . Cellulitis   . Cervical spinal stenosis   . Chronic osteomyelitis ([place])   . CKD (chronic kidney disease)   . CKD (chronic kidney disease)   . Clostridium difficile diarrhea   . Depression   . Diplopia   . Fatigue   . GERD (gastroesophageal reflux disease)   . Hyperlipemia   . Hypertension   . Hypovitaminosis D   . IBS (irritable bowel syndrome)   . 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 Insomnia   . Left lumbosacral radiculopathy   . Moderate obstructive sleep apnea   . Osteomyelitis of foot ([place])   . PAD (peripheral artery disease) ([place])   . Pruritus   . RA (rheumatoid arthritis) ([place])   . Radiculopathy   . Restless legs syndrome   . RLS (restless legs syndrome)   . SLE (systemic lupus erythematosus related syndrome) ([place])   . 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 Stroke ([place])   . Toxic maculopathy of both eyes     Past Surgical  History:  Procedure Laterality Date  . CARDIAC CATHETERIZATION    . CAROTID ENDARTERECTOMY    . CERVICAL LAMINECTOMY    . CORONARY ANGIOPLASTY    . FRACTURE SURGERY    . fusion C5-6-7    . HERNIA REPAIR    . KNEE ARTHROSCOPY    . TONSILLECTOMY      There were no vitals filed for this visit.  Subjective Assessment - 05/07/19 1310    Subjective  Patient reports doing well; no pain today; reports adherence with HEP    Patient is accompained by:  Family member    Pertinent History  Patient is a 74 y.o. male who presents to outpatient physical therapy with a referral for medical diagnosis of CVA. This patient's chief complaints consist of left hemiplegia and overall deconditioning leading to the following functional deficits: dependent for ADLs, IADLs, unable to transfer to car, dependent transfers at home with hoyer lift, unable to walk or stand without significant assistance, difficulty with W/C navigation..    Currently in Pain?  No/denies    Pain Onset  1 to 4 weeks ago    Multiple Pain Sites  No          TREATMENT: Prior to  transfersand in between transfers: Patient sittingin wheelchair: Instructed patient in LE strengthening exercise: -hip flexion march x15reps bilaterally -LAQ x10 reps bilaterally with visual cues to increase ROM especially on LLE;  -finger flicks to relax UE and stretch hands x5 reps; -deep breathing with erect posture x5 reps with cues for proper positioning; Ptrequires verbal and visual cues to improve ROM; He doesexhibit improved ROMbilaterally with better motor control and LE positioning;  Following exercise: Transferred sit<>Stand in parallel bars (seat adjusted to 27 inch from floor height) x5 reps throughout session;He was able to transfersupervision withminVCs for forward weight shift for better trunk control and transfer ability;  Standing in parallel bars: Standing,CGA to close supervision with min VCs for weight shift and  increased extension for better stance control;  -Mini squat with B rail assist x10 reps with cues to increase knee extension in BLE between repetitions;  -RLE hip flexion march x15reps with min A to shift to [employer] therapist blocking LLE knee; -LLE hip flexion marchx10Reps(partial ROM)with min A forincreased ROM, patient able to hold self up with supervision with therapist assisting with LLE movement;   Patient initially exhibits flexed posture with decreased hip extension; He was able to exhibit improved hip extension during subsequent transfers; Although he continues to require min A for safety and trunk control;  Progressed activity to Gait training: Patient stood in parallel bars, supervision with CGA for steadying upon standing. Patient ambulated 6 steps each LE ( approximately 7-8 feet) x2 sets required min A to advance LLE. He was able to advance RLE well with good control requiring intermittent LLE knee blocking. Patient does exhibit increased LLE knee extension in stance this session and was able to exhibit improved hip positioning with less posterior lean. He was unable to advance LLE independently due to weakness and imbalance;   Patient required minA to scoot back in chair at end of session due to increased fatigue  Response to treatment: [place]. Patient pleased with his ability to walk in parallel bars. He exhibits improved LE ROM this session with better LLE knee extension in stance. He also exhibits better trunk control with less posterior lean and better standing balance.                        PT Education - 05/07/19 1256    Education Details  transfers/positioning, HEP    Person(s) Educated  Patient;Child(ren)    Methods  Explanation;Demonstration;Verbal cues    Comprehension  Verbalized understanding;Returned demonstration;Verbal cues required;Need further instruction       PT Short Term Goals - 04/17/19 0902      PT  SHORT TERM GOAL #1   Title  Be independent with initial home exercise program for self-management of symptoms.    Baseline  HEP to be given at second session; advice to practice unsupported sitting at home (06/19/2018); 07/08/18: Performing at home, 6/29: needs assistance but is doing exercise program regularly; 08/22/18: adherent;    Time  2    Period  Weeks    Status  Achieved    Target Date  06/26/18        PT Long Term Goals - 04/17/19 0902      PT LONG TERM GOAL #1   Title  Patient will complete all bed mobility with min A to improve functional independence for getting in and out of bed and adjusting in bed.     Baseline  max A - total A reported by family (06/19/2018);  07/08/18: still requires maxA, dtr reports improvement in rolling since starting therapy; 6/23: min A to supervision in clinic; not doing at home right; 8/5: Pt's daughter states that his rolling has improved, but still has difficulty with all other bed mobility; 10/23/18: pts daughter states that she is doing about 35% of the work if he is in proper position, he does uses the bed rails to help roll;  using Harrel Lemon for coming to sit EOB; 10/12: pts daughter states that she is doing about 30% of the work if he is in proper position, he does uses the bed rails to help roll, but it is improving;  using Hoyer for coming to sit EOB, 10/29: min A for sit to sidelying, rolling supervision, able to transition sidelying to sit with HHA;    Time  12    Period  Weeks    Status  Achieved    Target Date  12/04/18      PT LONG TERM GOAL #2   Title  Patient will complete sit <> stand transfer chair to chair with Min A and LRAD to improve functional independence for household and community mobility.     Baseline  variable min-modA for STS, still requires elevated surface height to 27"    Time  12    Period  Weeks    Status  Partially Met    Target Date  05/23/19      PT LONG TERM GOAL #3   Title  Patient will navigate power w/c with min A x  100 feet to improve mobility for household and short community distances.    Baseline  required total A (06/12/2018); able to roll 20 feet with min A (06/20/2018); 07/08/18: Pt can go approximately 10' without assist per family;  10/23/2018: pt's daughter states that his power WC mobility has become 100% better with the new hand control, slowed speed, and with practice; 10/12: pt's daughter reports that he can perform household WC mobility and limited community ambulation with minA    Time  12    Period  Weeks    Status  Achieved    Target Date  05/23/19      PT LONG TERM GOAL #4   Title  Patient will complete car transfers W/C to family SUV with min A using LRAD to improve his abilty to participate in community activities.     Baseline  unable to complete car transfers at all (06/12/2018); unable to complete car transfers at all (06/19/2018).; 07/08/18: Unable to perform at this time; 09/11/18: unable to perform at this time; 10/23/2018: unable to complete car transfers at all; 10/12:  unable to complete car transfers at all, 10/29: unable; 01/13/19: unable; 2/11: min-mod A +2, 3/10: min A+1    Time  12    Period  Weeks    Status  Achieved    Target Date  05/23/19      PT LONG TERM GOAL #5   Title  Patient will ambulate at least 10 feet with min A using LRAD to improve mobility for household and community distances.    Baseline  unable to take steps (06/12/2018); able to shift R leg forward and back with max A and RW at edge of plinth (06/19/2018); 07/08/18: unable to ambulate at this time. 09/11/18: unable to perform at this time; 10/23/2018: unable to perform at this time; 10/12: unable to perform at this time, 10/29: unable at this time; 01/13/19: unable at this time, 2/11: min A to  step with RLE, unable to step with LLE, 3/10: able to take 2 steps in parallel bars with min A for safety;    Time  12    Period  Weeks    Status  Partially Met    Target Date  05/23/19      PT LONG TERM GOAL #6   Title  Patient will  be able to safety navigate up/down ramp with power chair, exhibiting good safety awareness, mod I to safely enter/exit his home.    Baseline  2/11: supervision with intermittent min A    Time  12    Period  Weeks    Status  On-going    Target Date  05/23/19      PT LONG TERM GOAL #7   Title  Patient will increase functional reach test to >15 inches in sitting to exhibit improved sitting balance and positioning and reduce fall risk;    Baseline  07/30/18: 12 inches; 09/11/18: 10-12 inches; 10/23/18: 12-13 inch; 10/12: 14.5", 10/29: 15.5 inch    Time  4    Period  Weeks    Status  Achieved    Target Date  05/23/19      PT LONG TERM GOAL #8   Title  Patient will increase LLE quad strength to 3+/5 to improve functional strength for standing and mobility;    Baseline  10/28: 2+/5; 01/13/19: 2+/5, 2/11: 3-/5, 04/16/19: 3-/5    Time  12    Period  Weeks    Status  Partially Met    Target Date  05/23/19            Plan - 05/07/19 1450    Clinical Impression Statement  Patient motivated and participated well within session. He is progressing well with improved LLE AROM this session and better standing balance  being able to find neutral position with supervision. Patient was able to progress gait training to ambulating approximately 7 feet x2 sets with better step length on RLE. He does require assistance for advancing LLE but is able to unweight LLE well with better weight shift to RLE. Patient does fatigue quickly with prolonged standing. He would benefit from additional skilled PT intervention to improve strength, balance and gait safety;    Comorbidities  Relevant past medical history and comorbidities include long term steroid use for lupus, CKD, chronic osteomyelitis, cervical spine stenosis, BPH, APS, alcohol abuse, peripheral artery disease, depression, GERD, obstructive sleep apnea, HTN, IBS, IgA deficiency, lumbar radiculopathy, RA, Stroke, toxic maculopathy in both eyes, systemic lupus  erythematosus related syndrome, cardiac catheterization, carotid endarterectomy, cervical laminectomy, coronary angioplasty, Fusion C5-C7, knee arthroplasty, lumbar surgery.    Rehab Potential  Fair    PT Frequency  3x / week    PT Duration  12 weeks    PT Treatment/Interventions  ADLs/Self Care Home Management;Electrical Stimulation;Moist Heat;Cryotherapy;Gait training;Stair training;Functional mobility training;Therapeutic exercise;Balance training;Neuromuscular re-education;Cognitive remediation;Patient/family education;Orthotic Fit/Training;Wheelchair mobility training;Manual techniques;Passive range of motion;Energy conservation;Joint Manipulations    PT Next Visit Plan  Work on strength, sitting/standing balance, functional activities such as rolling, bed mobility, transfers; caregiver training    PT Home Exercise Plan  no updates this session    Consulted and Agree with Plan of Care  Patient;Family member/caregiver    Family Member Consulted  daughter [name]       Patient will benefit from skilled therapeutic intervention in order to improve the following deficits and impairments:  Abnormal gait, Decreased activity tolerance, Decreased cognition, Decreased endurance, Decreased knowledge of  use of DME, Decreased range of motion, Decreased skin integrity, Decreased strength, Impaired perceived functional ability, Impaired sensation, Impaired UE functional use, Improper body mechanics, Pain, Cardiopulmonary status limiting activity, Decreased balance, Decreased coordination, Decreased mobility, Difficulty walking, Impaired tone, Postural dysfunction  Visit Diagnosis: Muscle weakness (generalized)  Other lack of coordination  Difficulty in walking, not elsewhere classified  Repeated falls     Problem List Patient Active Problem List   Diagnosis Date Noted  . Sepsis ([place]) 10/18/2018    Alyssia Heese PT, DPT 05/07/2019, 2:52 PM  [place] MAIN  [facility] SERVICES [address] [place], [place], [address] Phone: [phone]   Fax:  [phone]  Name: LEONE PUTMAN MRN: [id number] Date of Birth: 05/07/1945

## 2019-05-08 ENCOUNTER — Ambulatory Visit: Payer: Medicare Other | Attending: Family Medicine | Admitting: Occupational Therapy

## 2019-05-08 ENCOUNTER — Other Ambulatory Visit: Payer: Self-pay

## 2019-05-08 ENCOUNTER — Ambulatory Visit: Payer: Medicare Other | Admitting: Physical Therapy

## 2019-05-08 ENCOUNTER — Encounter: Payer: Self-pay | Admitting: Physical Therapy

## 2019-05-08 ENCOUNTER — Encounter: Payer: Self-pay | Admitting: Occupational Therapy

## 2019-05-08 DIAGNOSIS — R278 Other lack of coordination: Secondary | ICD-10-CM

## 2019-05-08 DIAGNOSIS — M6281 Muscle weakness (generalized): Secondary | ICD-10-CM | POA: Insufficient documentation

## 2019-05-08 DIAGNOSIS — R262 Difficulty in walking, not elsewhere classified: Secondary | ICD-10-CM

## 2019-05-08 DIAGNOSIS — R296 Repeated falls: Secondary | ICD-10-CM | POA: Insufficient documentation

## 2019-05-08 DIAGNOSIS — I69354 Hemiplegia and hemiparesis following cerebral infarction affecting left non-dominant side: Secondary | ICD-10-CM | POA: Diagnosis present

## 2019-05-08 NOTE — Therapy (Signed)
[place] MAIN [facility] SERVICES [address] [place], [place], [address] Phone: [phone]   Fax:  [phone]  Occupational Therapy Treatment  Patient Details  Name: Kenneth Summers MRN: [id number] Date of Birth: Mar 23, 1945 No data recorded  Encounter Date: 05/08/2019  OT End of Session - 05/08/19 1353    Visit Number  102    Number of Visits  157    Date for OT Re-Evaluation  06/30/19    Authorization Type  Progress report period starting 12/26/2018    OT Start Time  1300    OT Stop Time  1345    OT Time Calculation (min)  45 min    Activity Tolerance  Patient tolerated treatment well;Patient limited by fatigue    Behavior During Therapy  [facility] for tasks assessed/performed       Past Medical History:  Diagnosis Date  . Actinic keratosis   . Alcohol abuse   . APS (antiphospholipid syndrome) ([place])   . Basal cell carcinoma   . BPH (benign prostatic hyperplasia)   . CAD (coronary artery disease)   . Cellulitis   . Cervical spinal stenosis   . Chronic osteomyelitis ([place])   . CKD (chronic kidney disease)   . CKD (chronic kidney disease)   . Clostridium difficile diarrhea   . Depression   . Diplopia   . Fatigue   . GERD (gastroesophageal reflux disease)   . Hyperlipemia   . Hypertension   . Hypovitaminosis D   . IBS (irritable bowel syndrome)   . 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 Insomnia   . Left lumbosacral radiculopathy   . Moderate obstructive sleep apnea   . Osteomyelitis of foot ([place])   . PAD (peripheral artery disease) ([place])   . Pruritus   . RA (rheumatoid arthritis) ([place])   . Radiculopathy   . Restless legs syndrome   . RLS (restless legs syndrome)   . SLE (systemic lupus erythematosus related syndrome) ([place])   . 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 Stroke ([place])   . Toxic maculopathy of both eyes     Past Surgical History:  Procedure Laterality Date  . CARDIAC CATHETERIZATION    .  CAROTID ENDARTERECTOMY    . CERVICAL LAMINECTOMY    . CORONARY ANGIOPLASTY    . FRACTURE SURGERY    . fusion C5-6-7    . HERNIA REPAIR    . KNEE ARTHROSCOPY    . TONSILLECTOMY      There were no vitals filed for this visit.  Subjective Assessment - 05/08/19 1353    Subjective   Pt. that he is feeling good today.    Patient is accompanied by:  Family member    Pertinent History  Pt. is a 74 y.o. 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 Pt. PMHx includes: Multiple Falls, Lupus, DJD, Renal Abscess, CVA. Pt. resides with his wife. Pt.'s wife and daughter assist with ADLs. Pt. has caregivers in  for 2 hours a day, 6 days a week. Pt. received Rehab services in acute care, at SNF for STR, and [place] services. Pt. is retired from [employer] for [employer] and [employer].    Patient Stated Goals  To regain use of his left UE, and do more for himself.    Currently in Pain?  No/denies       Selfcare:  Pt. worked on UE dressing tasks  using hemi-dressing techniques. Pt. requiredlesscues forinitiatingpositionofthe sweatshirtwhenpreparing todonn a zip down sweatshirtover the LUE.Pt. requiredcues to thread the LUE with the right, assist to bring the jacket around his back, and reposition the jacket to prepare for zipping.Noassistneededto align the zipper in his hands 2's3 reps.   Therapeutic Activities:  Pt. worked on Financial risk analyst PCP pipe designs following simple design patterns. Pt. required increased time to complete, verbal cues, visual cues to locate his position on the design pattern, and assist to connect to the pipe pieces in the correct direction. Pt. Required moderate cues to engage his LUE during the task today.   Pt.reports being very fatigued today following PT. Pt. required increased time, consistent verbal cues for each step, and assist to to complete simple tasks following set design patterns.Pt. Continues to make steady  progress with UE dressing skills,howevercontinues to require increasedcues, and assist to donn the jacket in the correct direction, bring the jacket around his back, and shoulders, and manipulate the zipper. Pt. continues to require cues for left sided awareness, trunk control, engaging his LUE, motor, and coordination skills.Pt. continues to work on these skills in order to increaseengagement of the LUE during ADLs, and IADL tasks.                     OT Education - 05/08/19 1353    Education Details  Left UE functioning, left sided awareness.    Person(s) Educated  Patient    Methods  Explanation;Demonstration    Comprehension  Returned demonstration;Tactile cues required;Verbalized understanding;Need further instruction          OT Long Term Goals - 05/01/19 1309      OT LONG TERM GOAL #1   Title  Pt. will increase left shoulder flexion AROM by 10 degrees to access cabinet/shelf.    Baseline  Shoulder flexion:125. Pt. continues to work on reaching with increasing emphasis on flexing trunk to improve functional reach.    Time  12    Period  Weeks    Status  Partially Met    Target Date  06/30/19      OT LONG TERM GOAL #2   Title  Pt. will donn a shirt with Supervision.    Baseline  Pt. continues to require MinA donning a zip down jacket with consistent cues to initiate the correct direction of the jacket.    Time  12    Period  Weeks    Status  On-going    Target Date  06/30/19      OT LONG TERM GOAL #4   Title  Pt. will improve left grip strength by 10# to be able to open a jar/container.    Baseline  Pt. continues to progress with grip strength, and continues to work towards opening containers, and bottles.    Time  12    Period  Weeks    Status  On-going    Target Date  06/30/19      OT LONG TERM GOAL #5   Title  Pt. will improve left hand [facility] skills to be able to assist with buttoning/zipping.    Baseline  Pt. is improving with manipulating  the zipper on his jacket, however continues to require practice. Pt. continues to work on improving bilateral [facility] skills for buttoning/zipping    Time  12    Period  Weeks    Status  On-going    Target Date  06/30/19      OT LONG TERM GOAL #  6   Title  Pt. will demonstrate visual conmpensatory strategies for 100% of the time during ADLs    Baseline  Pt. requires cues for left sides awareness.    Time  12    Period  Weeks    Status  On-going    Target Date  06/30/19      OT LONG TERM GOAL #8   Title  Pt. will accurately identify potential safety hazard using good safety awareness, and judgement 100% for ADLs, and IADLs.    Baseline  Pt. requires cues    Time  12    Period  Weeks    Status  On-going    Target Date  06/30/19      OT LONG TERM GOAL  #9   TITLE  Pt. will  navigate his w/c around obstacles with Supervision and 100% accuracy    Baseline  Pt. continues to require cuing for obstacle on the left, however requires less cues.    Time  12    Period  Weeks    Status  On-going    Target Date  06/30/19      OT LONG TERM GOAL  #11   TITLE  Pt. will increase BUE strength by 60m grades in preparation for ADL transfers.    Baseline  Pt. continues to present with limited BUE strength    Time  12    Period  Weeks    Status  On-going    Target Date  06/30/19            Plan - 05/08/19 1354    Clinical Impression Statement  Patient continues to be motivated to regain functional use of LUE and hand with improved use for ADLs and IADLs but continues to lack in coordination and strength in L compared to R.  He has increased AROm in LUE and hand and worked on UExpress Scriptsdressing and undressing skills and then hot packs to L shoulder with active stretching and trigger point releases to uppper trapezius and levator scapulae and rhomboids.  Problem solving using visual perceptual skills continues to be difficult for him during pipe diagram activity using both hands.    OT Occupational Profile  and History  Problem Focused Assessment - Including review of records relating to presenting problem    Occupational performance deficits (Please refer to evaluation for details):  ADL's    Body Structure / Function / Physical Skills  UE functional use;Coordination;FMC;Dexterity;Strength;ROM    Cognitive Skills  Attention;Memory;Emotional;Problem Solve;Safety Awareness    Rehab Potential  Good    Clinical Decision Making  Several treatment options, min-mod task modification necessary    Comorbidities Affecting Occupational Performance:  Presence of comorbidities impacting occupational performance    Comorbidities impacting occupational performance description:  Phyical, cognitive, visual,  medical comorbidities    OT Frequency  2x / week    OT Duration  12 weeks    OT Treatment/Interventions  Self-care/ADL training;DME and/or AE instruction;Therapeutic exercise;Therapeutic activities;Moist Heat;Cognitive remediation/compensation;Neuromuscular education;Visual/perceptual remediation/compensation;Coping strategies training;Patient/family education;Passive range of motion;Psychosocial skills training;Energy conservation;Functional Mobility Training    Consulted and Agree with Plan of Care  Patient    Family Member Consulted  Daughter JMarcie Bal      Patient will benefit from skilled therapeutic intervention in order to improve the following deficits and impairments:   Body Structure / Function / Physical Skills: UE functional use, Coordination, FMC, Dexterity, Strength, ROM Cognitive Skills: Attention, Memory, Emotional, Problem Solve, Safety Awareness     Visit  Diagnosis: Muscle weakness (generalized)  Other lack of coordination  Hemiplegia and hemiparesis following cerebral infarction affecting left non-dominant side [facility])    Problem List Patient Active Problem List   Diagnosis Date Noted  . Sepsis [facility]) 10/18/2018    Chrys Racer, OTR/L, [facility] ascom [phone] 05/08/19, 2:01  PM  [place] MAIN [facility] SERVICES [address] [place], [place], [address] Phone: [phone]   Fax:  [phone]  Name: Kenneth Summers MRN: [id number] Date of Birth: May 08, 1945

## 2019-05-08 NOTE — Therapy (Signed)
[place] MAIN [facility] SERVICES [address] [place], [place], [address] Phone: [phone]   Fax:  [phone]  Physical Therapy Treatment Physical Therapy Progress Note   Dates of reporting period 03/19/19   to   05/08/19   Patient Details  Name: MONISH HALIBURTON MRN: [id number] Date of Birth: 31-Mar-1945 Referring Provider (PT): Dr. [name], Guy Begin   Encounter Date: 05/08/2019  PT End of Session - 05/08/19 1353    Visit Number  [id number]    Number of Visits  144    Date for PT Re-Evaluation  05/23/19    Authorization Time Period  goals last assessed 03/19/19    PT Start Time  1346    PT Stop Time  1430    PT Time Calculation (min)  44 min    Equipment Utilized During Treatment  Gait belt    Activity Tolerance  Patient tolerated treatment well;No increased pain    Behavior During Therapy  WFL for tasks assessed/performed       Past Medical History:  Diagnosis Date  . Actinic keratosis   . Alcohol abuse   . APS (antiphospholipid syndrome) ([place])   . Basal cell carcinoma   . BPH (benign prostatic hyperplasia)   . CAD (coronary artery disease)   . Cellulitis   . Cervical spinal stenosis   . Chronic osteomyelitis ([place])   . CKD (chronic kidney disease)   . CKD (chronic kidney disease)   . Clostridium difficile diarrhea   . Depression   . Diplopia   . Fatigue   . GERD (gastroesophageal reflux disease)   . Hyperlipemia   . Hypertension   . Hypovitaminosis D   . IBS (irritable bowel syndrome)   . 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 Insomnia   . Left lumbosacral radiculopathy   . Moderate obstructive sleep apnea   . Osteomyelitis of foot ([place])   . PAD (peripheral artery disease) ([place])   . Pruritus   . RA (rheumatoid arthritis) ([place])   . Radiculopathy   . Restless legs syndrome   . RLS (restless legs syndrome)   . SLE (systemic lupus erythematosus related syndrome) ([place])   . Squamous cell carcinoma of skin 01/27/2019   right mid lower  ear helix  . Stroke ([place])   . Toxic maculopathy of both eyes     Past Surgical History:  Procedure Laterality Date  . CARDIAC CATHETERIZATION    . CAROTID ENDARTERECTOMY    . CERVICAL LAMINECTOMY    . CORONARY ANGIOPLASTY    . FRACTURE SURGERY    . fusion C5-6-7    . HERNIA REPAIR    . KNEE ARTHROSCOPY    . TONSILLECTOMY      There were no vitals filed for this visit.  Subjective Assessment - 05/08/19 1352    Subjective  Reports doing well; no pain;    Patient is accompained by:  Family member    Pertinent History  Patient is a 74 y.o. male who presents to outpatient physical therapy with a referral for medical diagnosis of CVA. This patient's chief complaints consist of left hemiplegia and overall deconditioning leading to the following functional deficits: dependent for ADLs, IADLs, unable to transfer to car, dependent transfers at home with hoyer lift, unable to walk or stand without significant assistance, difficulty with W/C navigation..    Currently in Pain?  No/denies    Pain Onset  1 to 4 weeks ago    Multiple Pain Sites  No  TREATMENT:  Prior to transfers and in between transfers:  Patient sitting in wheelchair: Instructed patient in LE strengthening exercise, applied 2# ankle weight for better strengthening: -hip flexion march 2#x15 reps bilaterally  -LAQ 2# x15 reps bilaterally with visual cues to increase ROM especially on LLE;  Pt requires verbal and visual cues to improve ROM; He does exhibit improved ROM  bilaterally with better motor control and LE positioning;   Following exercise:  Transferred sit<>Stand in parallel bars (seat adjusted to 27 inch from floor height) x4 reps throughout session;He was able to transfer supervision with min VCs for forward weight shift for better trunk control and transfer ability;   Standing in parallel bars: Standing,  CGA to close supervision with min VCs for weight shift and increased extension for better  stance control;  -Mini squat with B rail assist x15 reps with cues to increase knee extension in BLE between repetitions;  -RLE hip flexion march x15 reps with min A to shift to LLE with therapist blocking LLE knee;    Patient initially exhibits flexed posture with decreased hip extension; He was able to exhibit improved hip extension during  subsequent transfers; Although he continues to require min A for safety and trunk control;    Progressed activity to Gait training: Patient stood in parallel bars, supervision with CGA for steadying upon standing. Patient ambulated 6 steps each LE ( approximately 10 feet) x2 sets required min A to advance LLE. He was able to advance RLE well with good control requiring intermittent LLE knee blocking. Patient does exhibit increased LLE knee extension in stance this session and was able to exhibit improved hip positioning with less posterior lean. He was unable to advance LLE independently due to weakness and imbalance;    Patient required min A to scoot back in chair at end of session due to increased fatigue   Response to treatment: Tolerated well. Patient pleased with his ability to walk in parallel bars. He exhibits improved LE ROM this session with better LLE knee extension in stance. He also exhibits better trunk control with less posterior lean and better standing balance.  [name] condition has the potential to improve in response to therapy. Maximum improvement is yet to be obtained. The anticipated improvement is attainable and reasonable in a generally predictable time.  Patient reports adherence with HEP. His daughter reports that ADLs are going better at home and he is doing much better with bed mobility;                     PT Education - 05/08/19 1353    Education Details  transfers/positioning, HEP    Person(s) Educated  Patient;Child(ren)    Methods  Explanation;Verbal cues    Comprehension  Verbalized understanding;Returned  demonstration;Verbal cues required;Need further instruction       PT Short Term Goals - 05/08/19 1430      PT SHORT TERM GOAL #1   Title  Be independent with initial home exercise program for self-management of symptoms.    Baseline  HEP to be given at second session; advice to practice unsupported sitting at home (06/19/2018); 07/08/18: Performing at home, 6/29: needs assistance but is doing exercise program regularly; 08/22/18: adherent;    Time  2    Period  Weeks    Status  Achieved    Target Date  06/26/18        PT Long Term Goals - 05/08/19 1431      PT  LONG TERM GOAL #1   Title  Patient will complete all bed mobility with min A to improve functional independence for getting in and out of bed and adjusting in bed.     Baseline  max A - total A reported by family (06/19/2018); 07/08/18: still requires maxA, dtr reports improvement in rolling since starting therapy; 6/23: min A to supervision in clinic; not doing at home right; 8/5: Pt's daughter states that his rolling has improved, but still has difficulty with all other bed mobility; 10/23/18: pts daughter states that she is doing about 35% of the work if he is in proper position, he does uses the bed rails to help roll;  using Hoyer for coming to sit EOB; 10/12: pts daughter states that she is doing about 30% of the work if he is in proper position, he does uses the bed rails to help roll, but it is improving;  using Hoyer for coming to sit EOB, 10/29: min A for sit to sidelying, rolling supervision, able to transition sidelying to sit with HHA;    Time  12    Period  Weeks    Status  Achieved    Target Date  12/04/18      PT LONG TERM GOAL #2   Title  Patient will complete sit <> stand transfer chair to chair with Min A and LRAD to improve functional independence for household and community mobility.     Baseline  supervision for STS, still requires elevated surface height to 27"    Time  12    Period  Weeks    Status  Achieved     Target Date  05/23/19      PT LONG TERM GOAL #3   Title  Patient will navigate power w/c with min A x 100 feet to improve mobility for household and short community distances.    Baseline  required total A (06/12/2018); able to roll 20 feet with min A (06/20/2018); 07/08/18: Pt can go approximately 10' without assist per family;  10/23/2018: pt's daughter states that his power WC mobility has become 100% better with the new hand control, slowed speed, and with practice; 10/12: pt's daughter reports that he can perform household WC mobility and limited community ambulation with minA    Time  12    Period  Weeks    Status  Achieved    Target Date  05/23/19      PT LONG TERM GOAL #4   Title  Patient will complete car transfers W/C to family SUV with min A using LRAD to improve his abilty to participate in community activities.     Baseline  unable to complete car transfers at all (06/12/2018); unable to complete car transfers at all (06/19/2018).; 07/08/18: Unable to perform at this time; 09/11/18: unable to perform at this time; 10/23/2018: unable to complete car transfers at all; 10/12:  unable to complete car transfers at all, 10/29: unable; 01/13/19: unable; 2/11: min-mod A +2, 3/10: min A+1    Time  12    Period  Weeks    Status  Achieved    Target Date  05/23/19      PT LONG TERM GOAL #5   Title  Patient will ambulate at least 10 feet with min A using LRAD to improve mobility for household and community distances.    Baseline  unable to take steps (06/12/2018); able to shift R leg forward and back with max A and RW at edge  of plinth (06/19/2018); 07/08/18: unable to ambulate at this time. 09/11/18: unable to perform at this time; 10/23/2018: unable to perform at this time; 10/12: unable to perform at this time, 10/29: unable at this time; 01/13/19: unable at this time, 2/11: min A to step with RLE, unable to step with LLE, 3/10: able to take 2 steps in parallel bars with min A for safety; 4/1: min A for walking 10  feet in parallel bars    Time  12    Period  Weeks    Status  Partially Met    Target Date  05/23/19      PT LONG TERM GOAL #6   Title  Patient will be able to safety navigate up/down ramp with power chair, exhibiting good safety awareness, mod I to safely enter/exit his home.    Baseline  2/11: supervision with intermittent min A;4/1: supervision;    Time  12    Period  Weeks    Status  Partially Met    Target Date  05/23/19      PT LONG TERM GOAL #7   Title  Patient will increase functional reach test to >15 inches in sitting to exhibit improved sitting balance and positioning and reduce fall risk;    Baseline  07/30/18: 12 inches; 09/11/18: 10-12 inches; 10/23/18: 12-13 inch; 10/12: 14.5", 10/29: 15.5 inch    Time  4    Period  Weeks    Status  Achieved    Target Date  05/23/19      PT LONG TERM GOAL #8   Title  Patient will increase LLE quad strength to 3+/5 to improve functional strength for standing and mobility;    Baseline  10/28: 2+/5; 01/13/19: 2+/5, 2/11: 3-/5, 04/16/19: 3-/5, 05/08/19: 3-/5    Time  12    Period  Weeks    Status  Partially Met    Target Date  05/23/19            Plan - 05/08/19 1430    Clinical Impression Statement  Patient motivated and participated well within session. He continues to progress in LE strengthening, exhibiting improved LE AROM on each LE. Patient is still very weak in LLE approximately 3-/5 being unable to achieve full ROM against gravity. He is able to shift weight in standing to neutral with better standing balance, progressing to supervision in parallel bars. Patient has progressed in LE strength/balance, being able to ambulate in parallel bars. He does require assistance for advancing LLE forward due to weakness but is able to exhibit better weight shift. He would benefit from additional skilled PT intervention to improve strength, balance and gait safety;    Comorbidities  Relevant past medical history and comorbidities include long  term steroid use for lupus, CKD, chronic osteomyelitis, cervical spine stenosis, BPH, APS, alcohol abuse, peripheral artery disease, depression, GERD, obstructive sleep apnea, HTN, IBS, IgA deficiency, lumbar radiculopathy, RA, Stroke, toxic maculopathy in both eyes, systemic lupus erythematosus related syndrome, cardiac catheterization, carotid endarterectomy, cervical laminectomy, coronary angioplasty, Fusion C5-C7, knee arthroplasty, lumbar surgery.    Rehab Potential  Fair    PT Frequency  3x / week    PT Duration  12 weeks    PT Treatment/Interventions  ADLs/Self Care Home Management;Electrical Stimulation;Moist Heat;Cryotherapy;Gait training;Stair training;Functional mobility training;Therapeutic exercise;Balance training;Neuromuscular re-education;Cognitive remediation;Patient/family education;Orthotic Fit/Training;Wheelchair mobility training;Manual techniques;Passive range of motion;Energy conservation;Joint Manipulations    PT Next Visit Plan  Work on strength, sitting/standing balance, functional activities such as rolling, bed mobility,  transfers; caregiver training    PT Home Exercise Plan  no updates this session    Consulted and Agree with Plan of Care  Patient;Family member/caregiver    Family Member Consulted  daughter [name]       Patient will benefit from skilled therapeutic intervention in order to improve the following deficits and impairments:  Abnormal gait, Decreased activity tolerance, Decreased cognition, Decreased endurance, Decreased knowledge of use of DME, Decreased range of motion, Decreased skin integrity, Decreased strength, Impaired perceived functional ability, Impaired sensation, Impaired UE functional use, Improper body mechanics, Pain, Cardiopulmonary status limiting activity, Decreased balance, Decreased coordination, Decreased mobility, Difficulty walking, Impaired tone, Postural dysfunction  Visit Diagnosis: Muscle weakness (generalized)  Other lack of  coordination  Difficulty in walking, not elsewhere classified  Repeated falls     Problem List Patient Active Problem List   Diagnosis Date Noted  . Sepsis ([place]) 10/18/2018    Kaelee Pfeffer PT, DPT 05/08/2019, 3:29 PM  [place] MAIN [facility] SERVICES [address] [place], [place], [address] Phone: [phone]   Fax:  [phone]  Name: JAVARIUS TSOSIE MRN: [id number] Date of Birth: November 01, 1945

## 2019-05-12 ENCOUNTER — Ambulatory Visit: Payer: Medicare Other | Admitting: Physical Therapy

## 2019-05-12 ENCOUNTER — Encounter: Payer: Self-pay | Admitting: Physical Therapy

## 2019-05-12 ENCOUNTER — Other Ambulatory Visit: Payer: Self-pay

## 2019-05-12 DIAGNOSIS — M6281 Muscle weakness (generalized): Secondary | ICD-10-CM

## 2019-05-12 DIAGNOSIS — R262 Difficulty in walking, not elsewhere classified: Secondary | ICD-10-CM

## 2019-05-12 DIAGNOSIS — R278 Other lack of coordination: Secondary | ICD-10-CM

## 2019-05-12 DIAGNOSIS — R296 Repeated falls: Secondary | ICD-10-CM

## 2019-05-12 NOTE — Therapy (Signed)
[place] MAIN [facility] SERVICES [address] [place], [place], [address] Phone: [phone]   Fax:  [phone]  Physical Therapy Treatment  Patient Details  Name: Kenneth Summers MRN: [id number] Date of Birth: [birth year] Referring Provider (PT): Dr. [name], Guy Begin   Encounter Date: 05/12/2019  PT End of Session - 05/12/19 1253    Visit Number  [id number]    Number of Visits  144    Date for PT Re-Evaluation  05/23/19    Authorization Time Period  goals last assessed 03/19/19    PT Start Time  1302    PT Stop Time  1345    PT Time Calculation (min)  43 min    Equipment Utilized During Treatment  Gait belt    Activity Tolerance  Patient tolerated treatment well;No increased pain    Behavior During Therapy  WFL for tasks assessed/performed       Past Medical History:  Diagnosis Date  . Actinic keratosis   . Alcohol abuse   . APS (antiphospholipid syndrome) ([place])   . Basal cell carcinoma   . BPH (benign prostatic hyperplasia)   . CAD (coronary artery disease)   . Cellulitis   . Cervical spinal stenosis   . Chronic osteomyelitis ([place])   . CKD (chronic kidney disease)   . CKD (chronic kidney disease)   . Clostridium difficile diarrhea   . Depression   . Diplopia   . Fatigue   . GERD (gastroesophageal reflux disease)   . Hyperlipemia   . Hypertension   . Hypovitaminosis D   . IBS (irritable bowel syndrome)   . 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 Insomnia   . Left lumbosacral radiculopathy   . Moderate obstructive sleep apnea   . Osteomyelitis of foot ([place])   . PAD (peripheral artery disease) ([place])   . Pruritus   . RA (rheumatoid arthritis) ([place])   . Radiculopathy   . Restless legs syndrome   . RLS (restless legs syndrome)   . SLE (systemic lupus erythematosus related syndrome) ([place])   . 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 Stroke ([place])   . Toxic maculopathy of both eyes     Past Surgical  History:  Procedure Laterality Date  . CARDIAC CATHETERIZATION    . CAROTID ENDARTERECTOMY    . CERVICAL LAMINECTOMY    . CORONARY ANGIOPLASTY    . FRACTURE SURGERY    . fusion C5-6-7    . HERNIA REPAIR    . KNEE ARTHROSCOPY    . TONSILLECTOMY      There were no vitals filed for this visit.  Subjective Assessment - 05/12/19 1302    Subjective  Patient reports doing well; Reports having a good weekend and getting to travel in the car for the first time. He reports having right heel pain this morning but states its okay right now.    Patient is accompained by:  Family member    Pertinent History  Patient is a 74 y.o. male who presents to outpatient physical therapy with a referral for medical diagnosis of CVA. This patient's chief complaints consist of left hemiplegia and overall deconditioning leading to the following functional deficits: dependent for ADLs, IADLs, unable to transfer to car, dependent transfers at home with hoyer lift, unable to walk or stand without significant assistance, difficulty with W/C navigation..    Currently in Pain?  No/denies    Pain Onset  1 to  4 weeks ago    Multiple Pain Sites  No          TREATMENT: Prior to transfersand in between transfers: Patient sittingin wheelchair: Instructed patient in LE strengthening exercise, applied 2# ankle weight for better strengthening: -hip flexion march 2#x15reps bilaterally -LAQ 2# x15 reps bilaterally with visual cues to increase ROM especially on LLE; Ptrequires verbal and visual cues to improve ROM; He doesexhibit improved ROMbilaterally with better motor control and LE positioning;  Following exercise: Transferred sit<>Stand in parallel bars (seat adjusted to 27 inch from floor height) x3reps throughout session;He was able to transfersupervision withminVCs for forward weight shift for better trunk control and transfer ability;  Standing in parallel bars: Standing,CGA to close  supervision with min VCs for weight shift and increased extension for better stance control; -Mini squat with B rail assist x15 reps with cues to increase knee extension in BLE between repetitions; -RLE hip flexion march 2# x10reps with min A to shift to [place] therapist blocking LLE knee;  Patient initially exhibits flexed posture with decreased hip extension; He was able to exhibit improved hip extension during subsequent transfers; Although he continues to require min A for safety and trunk control;  Patient transferred stand pivot wheelchair<>Nustep with min A +1 with mod VCs for hand placement and increased forward weight shift;  He requires cues for walking LLE foot around and required min A for placing LLE foot onto foot plate;  Instructed patient in Nustep, seat #15, arms #14 Pt instructed in BUE/BLE level2x68mnwith cues to increase speedto >50steps per minute to challenge cardiovascular endurance with cues to improve positional awareness for neutral position. Patient able to consistently propel at 60-80 steps per minute;   Distance propelled: 0.163mes,then continued for 3.5 additional minute, LE only level 1 at 30-40 steps per minute;  Total distance 0.25 miles;  HR 108 bpm at end of session;   He required min A for stand pivot transfer back to power chair with mod VCs for forward weight shift for improved trunk control. He had significant difficulty with last transfer with heavy posterior lean;   Patient required modA to scoot back in chair at end of session due to increased fatigue  Response to treatment: Tolerated fair. Patient heavily fatigued this session requiring increased seated rest breaks. He was able to tolerate increased strengthening, being able to do increased repetition and increased steps per minute on Nustep. Patient had difficulty with last transfer due to heavy fatigue.                         PT Education - 05/12/19 1253     Education Details  transfers/positioning, HEP    Person(s) Educated  Patient    Methods  Explanation;Verbal cues    Comprehension  Verbalized understanding;Returned demonstration;Verbal cues required;Need further instruction       PT Short Term Goals - 05/08/19 1430      PT SHORT TERM GOAL #1   Title  Be independent with initial home exercise program for self-management of symptoms.    Baseline  HEP to be given at second session; advice to practice unsupported sitting at home (06/19/2018); 07/08/18: Performing at home, 6/29: needs assistance but is doing exercise program regularly; 08/22/18: adherent;    Time  2    Period  Weeks    Status  Achieved    Target Date  06/26/18        PT Long Term Goals - 05/08/19 1431  PT LONG TERM GOAL #1   Title  Patient will complete all bed mobility with min A to improve functional independence for getting in and out of bed and adjusting in bed.     Baseline  max A - total A reported by family (06/19/2018); 07/08/18: still requires maxA, dtr reports improvement in rolling since starting therapy; 6/23: min A to supervision in clinic; not doing at home right; 8/5: Pt's daughter states that his rolling has improved, but still has difficulty with all other bed mobility; 10/23/18: pts daughter states that she is doing about 35% of the work if he is in proper position, he does uses the bed rails to help roll;  using Hoyer for coming to sit EOB; 10/12: pts daughter states that she is doing about 30% of the work if he is in proper position, he does uses the bed rails to help roll, but it is improving;  using Hoyer for coming to sit EOB, 10/29: min A for sit to sidelying, rolling supervision, able to transition sidelying to sit with HHA;    Time  12    Period  Weeks    Status  Achieved    Target Date  12/04/18      PT LONG TERM GOAL #2   Title  Patient will complete sit <> stand transfer chair to chair with Min A and LRAD to improve functional independence for  household and community mobility.     Baseline  supervision for STS, still requires elevated surface height to 27"    Time  12    Period  Weeks    Status  Achieved    Target Date  05/23/19      PT LONG TERM GOAL #3   Title  Patient will navigate power w/c with min A x 100 feet to improve mobility for household and short community distances.    Baseline  required total A (06/12/2018); able to roll 20 feet with min A (06/20/2018); 07/08/18: Pt can go approximately 10' without assist per family;  10/23/2018: pt's daughter states that his power WC mobility has become 100% better with the new hand control, slowed speed, and with practice; 10/12: pt's daughter reports that he can perform household WC mobility and limited community ambulation with minA    Time  12    Period  Weeks    Status  Achieved    Target Date  05/23/19      PT LONG TERM GOAL #4   Title  Patient will complete car transfers W/C to family SUV with min A using LRAD to improve his abilty to participate in community activities.     Baseline  unable to complete car transfers at all (06/12/2018); unable to complete car transfers at all (06/19/2018).; 07/08/18: Unable to perform at this time; 09/11/18: unable to perform at this time; 10/23/2018: unable to complete car transfers at all; 10/12:  unable to complete car transfers at all, 10/29: unable; 01/13/19: unable; 2/11: min-mod A +2, 3/10: min A+1    Time  12    Period  Weeks    Status  Achieved    Target Date  05/23/19      PT LONG TERM GOAL #5   Title  Patient will ambulate at least 10 feet with min A using LRAD to improve mobility for household and community distances.    Baseline  unable to take steps (06/12/2018); able to shift R leg forward and back with max A and RW at  edge of plinth (06/19/2018); 07/08/18: unable to ambulate at this time. 09/11/18: unable to perform at this time; 10/23/2018: unable to perform at this time; 10/12: unable to perform at this time, 10/29: unable at this time; 01/13/19:  unable at this time, 2/11: min A to step with RLE, unable to step with LLE, 3/10: able to take 2 steps in parallel bars with min A for safety; 4/1: min A for walking 10 feet in parallel bars    Time  12    Period  Weeks    Status  Partially Met    Target Date  05/23/19      PT LONG TERM GOAL #6   Title  Patient will be able to safety navigate up/down ramp with power chair, exhibiting good safety awareness, mod I to safely enter/exit his home.    Baseline  2/11: supervision with intermittent min A;4/1: supervision;    Time  12    Period  Weeks    Status  Partially Met    Target Date  05/23/19      PT LONG TERM GOAL #7   Title  Patient will increase functional reach test to >15 inches in sitting to exhibit improved sitting balance and positioning and reduce fall risk;    Baseline  07/30/18: 12 inches; 09/11/18: 10-12 inches; 10/23/18: 12-13 inch; 10/12: 14.5", 10/29: 15.5 inch    Time  4    Period  Weeks    Status  Achieved    Target Date  05/23/19      PT LONG TERM GOAL #8   Title  Patient will increase LLE quad strength to 3+/5 to improve functional strength for standing and mobility;    Baseline  10/28: 2+/5; 01/13/19: 2+/5, 2/11: 3-/5, 04/16/19: 3-/5, 05/08/19: 3-/5    Time  12    Period  Weeks    Status  Partially Met    Target Date  05/23/19            Plan - 05/12/19 1331    Clinical Impression Statement  Patient motivated and participated well within session,. Advanced LE strengthening with increased resistance and repetition. Patient does require verbal and visual cues to increase AROM and improve motor control. He was able to exercise farther and harder on Nustep this session with improved resistance/steps per minute and increased cardiovascular response. He does fatigue quickly requiring short seated rest break. Patient would benefit from additional skilled PT intervention to improve strength, balance and mobility;    Comorbidities  Relevant past medical history and  comorbidities include long term steroid use for lupus, CKD, chronic osteomyelitis, cervical spine stenosis, BPH, APS, alcohol abuse, peripheral artery disease, depression, GERD, obstructive sleep apnea, HTN, IBS, IgA deficiency, lumbar radiculopathy, RA, Stroke, toxic maculopathy in both eyes, systemic lupus erythematosus related syndrome, cardiac catheterization, carotid endarterectomy, cervical laminectomy, coronary angioplasty, Fusion C5-C7, knee arthroplasty, lumbar surgery.    Rehab Potential  Fair    PT Frequency  3x / week    PT Duration  12 weeks    PT Treatment/Interventions  ADLs/Self Care Home Management;Electrical Stimulation;Moist Heat;Cryotherapy;Gait training;Stair training;Functional mobility training;Therapeutic exercise;Balance training;Neuromuscular re-education;Cognitive remediation;Patient/family education;Orthotic Fit/Training;Wheelchair mobility training;Manual techniques;Passive range of motion;Energy conservation;Joint Manipulations    PT Next Visit Plan  Work on strength, sitting/standing balance, functional activities such as rolling, bed mobility, transfers; caregiver training    PT Home Exercise Plan  no updates this session    Consulted and Agree with Plan of Care  Patient;Family member/caregiver  Family Member Consulted  daughter [name]       Patient will benefit from skilled therapeutic intervention in order to improve the following deficits and impairments:  Abnormal gait, Decreased activity tolerance, Decreased cognition, Decreased endurance, Decreased knowledge of use of DME, Decreased range of motion, Decreased skin integrity, Decreased strength, Impaired perceived functional ability, Impaired sensation, Impaired UE functional use, Improper body mechanics, Pain, Cardiopulmonary status limiting activity, Decreased balance, Decreased coordination, Decreased mobility, Difficulty walking, Impaired tone, Postural dysfunction  Visit Diagnosis: Muscle weakness  (generalized)  Other lack of coordination  Difficulty in walking, not elsewhere classified  Repeated falls     Problem List Patient Active Problem List   Diagnosis Date Noted  . Sepsis ([place]) 10/18/2018    Kenneth Summers PT, DPT 05/12/2019, 3:26 PM  [place] MAIN [facility] SERVICES [address] [place], [place], [address] Phone: [phone]   Fax:  [phone]  Name: Kenneth Summers MRN: [id number] Date of Birth: [birth year]

## 2019-05-14 ENCOUNTER — Ambulatory Visit: Payer: Medicare Other | Admitting: Physical Therapy

## 2019-05-14 ENCOUNTER — Encounter: Payer: Self-pay | Admitting: Physical Therapy

## 2019-05-14 ENCOUNTER — Other Ambulatory Visit: Payer: Self-pay

## 2019-05-14 DIAGNOSIS — R262 Difficulty in walking, not elsewhere classified: Secondary | ICD-10-CM

## 2019-05-14 DIAGNOSIS — M6281 Muscle weakness (generalized): Secondary | ICD-10-CM | POA: Diagnosis not present

## 2019-05-14 DIAGNOSIS — R278 Other lack of coordination: Secondary | ICD-10-CM

## 2019-05-14 DIAGNOSIS — R296 Repeated falls: Secondary | ICD-10-CM

## 2019-05-14 NOTE — Therapy (Signed)
[place] MAIN [facility] SERVICES [address] [place], [place], [address] Phone: [phone]   Fax:  [phone]  Physical Therapy Treatment  Patient Details  Name: Kenneth Summers MRN: [id number] Date of Birth: 1945/08/15 Referring Provider (PT): Dr. [name], Guy Begin   Encounter Date: 05/14/2019  PT End of Session - 05/14/19 1444    Visit Number  [id number]    Number of Visits  144    Date for PT Re-Evaluation  05/23/19    Authorization Time Period  goals last assessed 03/19/19    PT Start Time  1302    PT Stop Time  1345    PT Time Calculation (min)  43 min    Equipment Utilized During Treatment  Gait belt    Activity Tolerance  Patient tolerated treatment well;No increased pain    Behavior During Therapy  WFL for tasks assessed/performed       Past Medical History:  Diagnosis Date  . Actinic keratosis   . Alcohol abuse   . APS (antiphospholipid syndrome) ([place])   . Basal cell carcinoma   . BPH (benign prostatic hyperplasia)   . CAD (coronary artery disease)   . Cellulitis   . Cervical spinal stenosis   . Chronic osteomyelitis ([place])   . CKD (chronic kidney disease)   . CKD (chronic kidney disease)   . Clostridium difficile diarrhea   . Depression   . Diplopia   . Fatigue   . GERD (gastroesophageal reflux disease)   . Hyperlipemia   . Hypertension   . Hypovitaminosis D   . IBS (irritable bowel syndrome)   . 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 Insomnia   . Left lumbosacral radiculopathy   . Moderate obstructive sleep apnea   . Osteomyelitis of foot ([place])   . PAD (peripheral artery disease) ([place])   . Pruritus   . RA (rheumatoid arthritis) ([place])   . Radiculopathy   . Restless legs syndrome   . RLS (restless legs syndrome)   . SLE (systemic lupus erythematosus related syndrome) ([place])   . 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 Stroke ([place])   . Toxic maculopathy of both eyes     Past Surgical  History:  Procedure Laterality Date  . CARDIAC CATHETERIZATION    . CAROTID ENDARTERECTOMY    . CERVICAL LAMINECTOMY    . CORONARY ANGIOPLASTY    . FRACTURE SURGERY    . fusion C5-6-7    . HERNIA REPAIR    . KNEE ARTHROSCOPY    . TONSILLECTOMY      There were no vitals filed for this visit.  Subjective Assessment - 05/14/19 1443    Subjective  Patient reports doing well. He reports some stiffness and fatigue in LLE with increased difficulty moving leg this morning. Reports falling earlier in the week when his wife was trying to get him in the recliner with the hoyer lift. no injury reported.    Patient is accompained by:  Family member    Pertinent History  Patient is a 74 y.o. male who presents to outpatient physical therapy with a referral for medical diagnosis of CVA. This patient's chief complaints consist of left hemiplegia and overall deconditioning leading to the following functional deficits: dependent for ADLs, IADLs, unable to transfer to car, dependent transfers at home with hoyer lift, unable to walk or stand without significant assistance, difficulty with W/C navigation..    Currently in Pain?  No/denies    Pain Onset  1 to 4 weeks ago    Multiple Pain Sites  No             TREATMENT: Prior to transfersand in between transfers: Patient sittingin wheelchair: Instructed patient in LE strengthening exercise, applied 2# ankle weight for better strengthening: -hip flexion march2#x15reps bilaterally -LAQ2#x15reps bilaterally with visual cues to increase ROM especially on LLE; Ptrequires verbal and visual cues to improve ROM; He doesexhibit improved ROMbilaterally with better motor control and LE positioning;  Following exercise: Transferred sit<>Stand in parallel bars (seat adjusted to 27 inch from floor height) x4reps throughout session;He was able to transfersupervision withminVCs for forward weight shift for better trunk control and transfer  ability;  Standing in parallel bars: Standing,CGA to close supervision with min VCs for weight shift and increased extension for better stance control; -Mini squat with B rail assist x15reps with cues to increase knee extension in BLE between repetitions; -RLE hip flexion march 2# x15reps with min A to shift to [place] therapist blocking LLE knee; -shifting to neutral positioning, standing with reducing rail assist to finger tip hold intermittently, min A for safety with increased weight bearing in BLE;   Patient initially exhibits flexed posture with decreased hip extension; He was able to exhibit improved hip extension during subsequent transfers; Although he continues to require min A for safety and trunk control;  Progressed activity to Gait training: Patient stood in parallel bars, supervision with CGA for steadying upon standing. Patient ambulated 4 steps each LE ( approximately25fet) x1 setsrequired min A to advance LLE. He was able to advance RLE well with good control requiring intermittent LLE knee blocking. Patient does exhibit increased LLE knee extension in stance this session and was able to exhibit improved hip positioning with less posterior lean. He was unable to advance LLE independently due to weakness and imbalance;  Patient required modA to scoot back in chair at end of session due to increased fatigue  Response to treatment: Tolerated fair. Patient heavily fatigued this session requiring increased seated rest breaks. He was able to tolerate increased strengthening, being able to do increased repetitions. He does require increased verbal/visual cues for proper exercise technique and to increase LLE activation. Patient was able to shift more weight on BLE in standing with less rail assist.                     PT Education - 05/14/19 1443    Education Details  transfers, positioning, exercise, HEP    Person(s) Educated  Patient;Child(ren)     Methods  Explanation;Verbal cues    Comprehension  Verbalized understanding;Returned demonstration;Verbal cues required;Need further instruction       PT Short Term Goals - 05/08/19 1430      PT SHORT TERM GOAL #1   Title  Be independent with initial home exercise program for self-management of symptoms.    Baseline  HEP to be given at second session; advice to practice unsupported sitting at home (06/19/2018); 07/08/18: Performing at home, 6/29: needs assistance but is doing exercise program regularly; 08/22/18: adherent;    Time  2    Period  Weeks    Status  Achieved    Target Date  06/26/18        PT Long Term Goals - 05/08/19 1431      PT LONG TERM GOAL #1   Title  Patient will complete all bed mobility with min A to improve functional independence for  getting in and out of bed and adjusting in bed.     Baseline  max A - total A reported by family (06/19/2018); 07/08/18: still requires maxA, dtr reports improvement in rolling since starting therapy; 6/23: min A to supervision in clinic; not doing at home right; 8/5: Pt's daughter states that his rolling has improved, but still has difficulty with all other bed mobility; 10/23/18: pts daughter states that she is doing about 35% of the work if he is in proper position, he does uses the bed rails to help roll;  using Hoyer for coming to sit EOB; 10/12: pts daughter states that she is doing about 30% of the work if he is in proper position, he does uses the bed rails to help roll, but it is improving;  using Hoyer for coming to sit EOB, 10/29: min A for sit to sidelying, rolling supervision, able to transition sidelying to sit with HHA;    Time  12    Period  Weeks    Status  Achieved    Target Date  12/04/18      PT LONG TERM GOAL #2   Title  Patient will complete sit <> stand transfer chair to chair with Min A and LRAD to improve functional independence for household and community mobility.     Baseline  supervision for STS, still  requires elevated surface height to 27"    Time  12    Period  Weeks    Status  Achieved    Target Date  05/23/19      PT LONG TERM GOAL #3   Title  Patient will navigate power w/c with min A x 100 feet to improve mobility for household and short community distances.    Baseline  required total A (06/12/2018); able to roll 20 feet with min A (06/20/2018); 07/08/18: Pt can go approximately 10' without assist per family;  10/23/2018: pt's daughter states that his power WC mobility has become 100% better with the new hand control, slowed speed, and with practice; 10/12: pt's daughter reports that he can perform household WC mobility and limited community ambulation with minA    Time  12    Period  Weeks    Status  Achieved    Target Date  05/23/19      PT LONG TERM GOAL #4   Title  Patient will complete car transfers W/C to family SUV with min A using LRAD to improve his abilty to participate in community activities.     Baseline  unable to complete car transfers at all (06/12/2018); unable to complete car transfers at all (06/19/2018).; 07/08/18: Unable to perform at this time; 09/11/18: unable to perform at this time; 10/23/2018: unable to complete car transfers at all; 10/12:  unable to complete car transfers at all, 10/29: unable; 01/13/19: unable; 2/11: min-mod A +2, 3/10: min A+1    Time  12    Period  Weeks    Status  Achieved    Target Date  05/23/19      PT LONG TERM GOAL #5   Title  Patient will ambulate at least 10 feet with min A using LRAD to improve mobility for household and community distances.    Baseline  unable to take steps (06/12/2018); able to shift R leg forward and back with max A and RW at edge of plinth (06/19/2018); 07/08/18: unable to ambulate at this time. 09/11/18: unable to perform at this time; 10/23/2018: unable to perform at  this time; 10/12: unable to perform at this time, 10/29: unable at this time; 01/13/19: unable at this time, 2/11: min A to step with RLE, unable to step with LLE,  3/10: able to take 2 steps in parallel bars with min A for safety; 4/1: min A for walking 10 feet in parallel bars    Time  12    Period  Weeks    Status  Partially Met    Target Date  05/23/19      PT LONG TERM GOAL #6   Title  Patient will be able to safety navigate up/down ramp with power chair, exhibiting good safety awareness, mod I to safely enter/exit his home.    Baseline  2/11: supervision with intermittent min A;4/1: supervision;    Time  12    Period  Weeks    Status  Partially Met    Target Date  05/23/19      PT LONG TERM GOAL #7   Title  Patient will increase functional reach test to >15 inches in sitting to exhibit improved sitting balance and positioning and reduce fall risk;    Baseline  07/30/18: 12 inches; 09/11/18: 10-12 inches; 10/23/18: 12-13 inch; 10/12: 14.5", 10/29: 15.5 inch    Time  4    Period  Weeks    Status  Achieved    Target Date  05/23/19      PT LONG TERM GOAL #8   Title  Patient will increase LLE quad strength to 3+/5 to improve functional strength for standing and mobility;    Baseline  10/28: 2+/5; 01/13/19: 2+/5, 2/11: 3-/5, 04/16/19: 3-/5, 05/08/19: 3-/5    Time  12    Period  Weeks    Status  Partially Met    Target Date  05/23/19            Plan - 05/14/19 1444    Clinical Impression Statement  Patient motivated and participated well within session. He was instructed in advanced LE strengthening exercise. He did require increased visual/verbal cues for correct exercise technique and positioning. He required increased time with advanced exercise. Patient does fatigue quickly with transfers requiring short seated rest breaks. He was able to ambulate in parallel bars for short distance, being limited by fatigue. He would benefit from additional skilled PT Intervention to improve strength, balance and mobility;    Comorbidities  Relevant past medical history and comorbidities include long term steroid use for lupus, CKD, chronic osteomyelitis,  cervical spine stenosis, BPH, APS, alcohol abuse, peripheral artery disease, depression, GERD, obstructive sleep apnea, HTN, IBS, IgA deficiency, lumbar radiculopathy, RA, Stroke, toxic maculopathy in both eyes, systemic lupus erythematosus related syndrome, cardiac catheterization, carotid endarterectomy, cervical laminectomy, coronary angioplasty, Fusion C5-C7, knee arthroplasty, lumbar surgery.    Rehab Potential  Fair    PT Frequency  3x / week    PT Duration  12 weeks    PT Treatment/Interventions  ADLs/Self Care Home Management;Electrical Stimulation;Moist Heat;Cryotherapy;Gait training;Stair training;Functional mobility training;Therapeutic exercise;Balance training;Neuromuscular re-education;Cognitive remediation;Patient/family education;Orthotic Fit/Training;Wheelchair mobility training;Manual techniques;Passive range of motion;Energy conservation;Joint Manipulations    PT Next Visit Plan  Work on strength, sitting/standing balance, functional activities such as rolling, bed mobility, transfers; caregiver training    PT Home Exercise Plan  no updates this session    Consulted and Agree with Plan of Care  Patient;Family member/caregiver    Family Member Consulted  daughter [name]       Patient will benefit from skilled therapeutic intervention in order to  improve the following deficits and impairments:  Abnormal gait, Decreased activity tolerance, Decreased cognition, Decreased endurance, Decreased knowledge of use of DME, Decreased range of motion, Decreased skin integrity, Decreased strength, Impaired perceived functional ability, Impaired sensation, Impaired UE functional use, Improper body mechanics, Pain, Cardiopulmonary status limiting activity, Decreased balance, Decreased coordination, Decreased mobility, Difficulty walking, Impaired tone, Postural dysfunction  Visit Diagnosis: Muscle weakness (generalized)  Other lack of coordination  Difficulty in walking, not elsewhere  classified  Repeated falls     Problem List Patient Active Problem List   Diagnosis Date Noted  . Sepsis ([place]) 10/18/2018    Jaecob Lowden PT, DPT  05/14/2019, 2:45 PM  [place] MAIN [facility] SERVICES [address] [place], [place], [address] Phone: [phone]   Fax:  [phone]  Name: Kenneth Summers MRN: [id number] Date of Birth: [birth year]

## 2019-05-15 ENCOUNTER — Other Ambulatory Visit: Payer: Self-pay

## 2019-05-15 ENCOUNTER — Encounter: Payer: Self-pay | Admitting: Physical Therapy

## 2019-05-15 ENCOUNTER — Encounter: Payer: Self-pay | Admitting: Occupational Therapy

## 2019-05-15 ENCOUNTER — Ambulatory Visit: Payer: Medicare Other | Admitting: Physical Therapy

## 2019-05-15 ENCOUNTER — Ambulatory Visit: Payer: Medicare Other | Admitting: Occupational Therapy

## 2019-05-15 DIAGNOSIS — M6281 Muscle weakness (generalized): Secondary | ICD-10-CM

## 2019-05-15 DIAGNOSIS — R278 Other lack of coordination: Secondary | ICD-10-CM

## 2019-05-15 DIAGNOSIS — R262 Difficulty in walking, not elsewhere classified: Secondary | ICD-10-CM

## 2019-05-15 DIAGNOSIS — R296 Repeated falls: Secondary | ICD-10-CM

## 2019-05-15 NOTE — Therapy (Signed)
[place] MAIN [facility] SERVICES [address] [place], [place], [address] Phone: [phone]   Fax:  [phone]  Occupational Therapy Treatment  Kenneth Summers Details  Name: Kenneth Summers MRN: [id number] Date of Birth: March 21, 1945 No data recorded  Encounter Date: 05/15/2019  OT End of Session - 05/15/19 1305    Visit Number  [id number]    Number of Visits  157    Date for OT Re-Evaluation  06/30/19    Authorization Type  Progress report period starting 12/26/2018    OT Start Time  1300    OT Stop Time  1345    OT Time Calculation (min)  45 min    Activity Tolerance  Kenneth Summers tolerated treatment well;Kenneth Summers limited by fatigue    Behavior During Therapy  [facility] for tasks assessed/performed       Past Medical History:  Diagnosis Date  . Actinic keratosis   . Alcohol abuse   . APS (antiphospholipid syndrome) ([place])   . Basal cell carcinoma   . BPH (benign prostatic hyperplasia)   . CAD (coronary artery disease)   . Cellulitis   . Cervical spinal stenosis   . Chronic osteomyelitis ([place])   . CKD (chronic kidney disease)   . CKD (chronic kidney disease)   . Clostridium difficile diarrhea   . Depression   . Diplopia   . Fatigue   . GERD (gastroesophageal reflux disease)   . Hyperlipemia   . Hypertension   . Hypovitaminosis D   . IBS (irritable bowel syndrome)   . 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 Insomnia   . Left lumbosacral radiculopathy   . Moderate obstructive sleep apnea   . Osteomyelitis of foot ([place])   . PAD (peripheral artery disease) ([place])   . Pruritus   . RA (rheumatoid arthritis) ([place])   . Radiculopathy   . Restless legs syndrome   . RLS (restless legs syndrome)   . SLE (systemic lupus erythematosus related syndrome) ([place])   . 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 Stroke ([place])   . Toxic maculopathy of both eyes     Past Surgical History:  Procedure Laterality Date  . CARDIAC CATHETERIZATION    .  CAROTID ENDARTERECTOMY    . CERVICAL LAMINECTOMY    . CORONARY ANGIOPLASTY    . FRACTURE SURGERY    . fusion C5-6-7    . HERNIA REPAIR    . KNEE ARTHROSCOPY    . TONSILLECTOMY      There were no vitals filed for this visit.  Subjective Assessment - 05/15/19 1303    Subjective   Pt. reports left LLE pain    Kenneth Summers is accompanied by:  Kenneth Summers    Pertinent History  Pt. is a 74 y.o. 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 Pt. PMHx includes: Multiple Falls, Lupus, DJD, Renal Abscess, CVA. Pt. resides with his wife. Pt.'s wife and daughter assist with ADLs. Pt. has caregivers in  for 2 hours a day, 6 days a week. Pt. received Rehab services in acute care, at SNF for STR, and [facility] services. Pt. is retired from [employer] for [employer] and [employer].    Kenneth Summers Stated Goals  To regain use of his left UE, and do more for himself.    Currently in Pain?  Yes    Pain Score  8     Pain Location  Leg  Pain Orientation  Left    Pain Descriptors / Indicators  Aching       Neuromuscular Re-education:  Pt. worked on grasping 1", 3/4", 1/2" washers with his left hand, and  Pt. Worked on flexing forward with the trunk with reaching place them onto the elevated hooks. Pt worked on flexing forward with the trunk when reaching forward. Pt required increased cues to engage, and use his left hand during the task.  Pt. Has 8/10 restless leg pain in his LLE. Pt. Required reclining restbreaks today. No LUE discomfort today.  Pt. Presented with improved sustained attention during the task. Pt. continues to require cues for left sided awareness, engaging his LUE,  trunk control, LUE strength, and coordination skills. Pt. continues to work on these skills in order to increase engagement of the LUE during ADLs, and IADL tasks.                         OT Education - 05/15/19 1304    Education Details  Left UE functioning, left sided  awareness.    Person(s) Educated  Kenneth Summers    Methods  Explanation;Demonstration    Comprehension  Returned demonstration;Tactile cues required;Verbalized understanding;Need further instruction          OT Long Term Goals - 05/01/19 1309      OT LONG TERM GOAL #1   Title  Pt. will increase left shoulder flexion AROM by 10 degrees to access cabinet/shelf.    Baseline  Shoulder flexion:125. Pt. continues to work on reaching with increasing emphasis on flexing trunk to improve functional reach.    Time  12    Period  Weeks    Status  Partially Met    Target Date  06/30/19      OT LONG TERM GOAL #2   Title  Pt. will donn a shirt with Supervision.    Baseline  Pt. continues to require MinA donning a zip down jacket with consistent cues to initiate the correct direction of the jacket.    Time  12    Period  Weeks    Status  On-going    Target Date  06/30/19      OT LONG TERM GOAL #4   Title  Pt. will improve left grip strength by 10# to be able to open a jar/container.    Baseline  Pt. continues to progress with grip strength, and continues to work towards opening containers, and bottles.    Time  12    Period  Weeks    Status  On-going    Target Date  06/30/19      OT LONG TERM GOAL #5   Title  Pt. will improve left hand [facility] skills to be able to assist with buttoning/zipping.    Baseline  Pt. is improving with manipulating the zipper on his jacket, however continues to require practice. Pt. continues to work on improving bilateral [facility] skills for buttoning/zipping    Time  12    Period  Weeks    Status  On-going    Target Date  06/30/19      OT LONG TERM GOAL #6   Title  Pt. will demonstrate visual conmpensatory strategies for 100% of the time during ADLs    Baseline  Pt. requires cues for left sides awareness.    Time  12    Period  Weeks    Status  On-going    Target Date  06/30/19  OT LONG TERM GOAL #8   Title  Pt. will accurately identify potential safety hazard  using good safety awareness, and judgement 100% for ADLs, and IADLs.    Baseline  Pt. requires cues    Time  12    Period  Weeks    Status  On-going    Target Date  06/30/19      OT LONG TERM GOAL  #9   TITLE  Pt. will  navigate his w/c around obstacles with Supervision and 100% accuracy    Baseline  Pt. continues to require cuing for obstacle on the left, however requires less cues.    Time  12    Period  Weeks    Status  On-going    Target Date  06/30/19      OT LONG TERM GOAL  #11   TITLE  Pt. will increase BUE strength by 14m grades in preparation for ADL transfers.    Baseline  Pt. continues to present with limited BUE strength    Time  12    Period  Weeks    Status  On-going    Target Date  06/30/19            Plan - 05/15/19 1305    Clinical Impression Statement  Pt. Has 8/10 restless leg pain in his LLE. Pt. Required reclining restbreaks today. No LUE discomfort today.  Pt. Presented with improved sustained attention during the task. Pt. continues to require cues for left sided awareness, engaging his LUE,  trunk control, LUE strength, and coordination skills. Pt. continues to work on these skills in order to increase engagement of the LUE during ADLs, and IADL tasks.   OT Occupational Profile and History  Problem Focused Assessment - Including review of records relating to presenting problem    Occupational performance deficits (Please refer to evaluation for details):  ADL's    Body Structure / Function / Physical Skills  UE functional use;Coordination;FMC;Dexterity;Strength;ROM    Rehab Potential  Good    Clinical Decision Making  Several treatment options, min-mod task modification necessary    Comorbidities Affecting Occupational Performance:  Presence of comorbidities impacting occupational performance    Comorbidities impacting occupational performance description:  Phyical, cognitive, visual,  medical comorbidities    Modification or Assistance to Complete  Evaluation   No modification of tasks or assist necessary to complete eval    OT Frequency  2x / week    OT Duration  12 weeks    OT Treatment/Interventions  Self-care/ADL training;DME and/or AE instruction;Therapeutic exercise;Therapeutic activities;Moist Heat;Cognitive remediation/compensation;Neuromuscular education;Visual/perceptual remediation/compensation;Coping strategies training;Kenneth Summers/Kenneth education;Passive range of motion;Psychosocial skills training;Energy conservation;Functional Mobility Training    Recommended Other Services  PT    Consulted and Agree with Plan of Care  Kenneth Summers    Kenneth Summers Consulted  Daughter JMarcie Bal      Kenneth Summers will benefit from skilled therapeutic intervention in order to improve the following deficits and impairments:   Body Structure / Function / Physical Skills: UE functional use, Coordination, FMC, Dexterity, Strength, ROM       Visit Diagnosis: Muscle weakness (generalized)  Other lack of coordination    Problem List Kenneth Summers Active Problem List   Diagnosis Date Noted  . Sepsis ([place] 10/18/2018    EHarrel Carina MS, OTR/L 05/15/2019, 1:07 PM  [place] [facility] [address] [address]Phone: [phone]  Fax:  [phone] Name: JJIMEL MYLERMRN: [id number] of  Birth: 1945-08-28

## 2019-05-15 NOTE — Therapy (Signed)
[place] MAIN [facility] SERVICES [address] [place], [place], [address] Phone: [phone]   Fax:  [phone]  Physical Therapy Treatment  Patient Details  Name: Kenneth Summers MRN: [id number] Date of Birth: 04-07-1945 Referring Provider (PT): Dr. [name], Guy Begin   Encounter Date: 05/15/2019  PT End of Session - 05/15/19 1307    Visit Number  [id number]    Number of Visits  144    Date for PT Re-Evaluation  05/23/19    Authorization Time Period  goals last assessed 03/19/19    PT Start Time  1347    PT Stop Time  1430    PT Time Calculation (min)  43 min    Equipment Utilized During Treatment  Gait belt    Activity Tolerance  Patient tolerated treatment well;No increased pain    Behavior During Therapy  WFL for tasks assessed/performed       Past Medical History:  Diagnosis Date  . Actinic keratosis   . Alcohol abuse   . APS (antiphospholipid syndrome) ([place])   . Basal cell carcinoma   . BPH (benign prostatic hyperplasia)   . CAD (coronary artery disease)   . Cellulitis   . Cervical spinal stenosis   . Chronic osteomyelitis ([place])   . CKD (chronic kidney disease)   . CKD (chronic kidney disease)   . Clostridium difficile diarrhea   . Depression   . Diplopia   . Fatigue   . GERD (gastroesophageal reflux disease)   . Hyperlipemia   . Hypertension   . Hypovitaminosis D   . IBS (irritable bowel syndrome)   . 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 Insomnia   . Left lumbosacral radiculopathy   . Moderate obstructive sleep apnea   . Osteomyelitis of foot ([place])   . PAD (peripheral artery disease) ([place])   . Pruritus   . RA (rheumatoid arthritis) ([place])   . Radiculopathy   . Restless legs syndrome   . RLS (restless legs syndrome)   . SLE (systemic lupus erythematosus related syndrome) ([place])   . 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 Stroke ([place])   . Toxic maculopathy of both eyes     Past Surgical  History:  Procedure Laterality Date  . CARDIAC CATHETERIZATION    . CAROTID ENDARTERECTOMY    . CERVICAL LAMINECTOMY    . CORONARY ANGIOPLASTY    . FRACTURE SURGERY    . fusion C5-6-7    . HERNIA REPAIR    . KNEE ARTHROSCOPY    . TONSILLECTOMY      There were no vitals filed for this visit.  Subjective Assessment - 05/15/19 1343    Subjective  Patient reports increased fatigue. He reports increased restless leg pain today.    Patient is accompained by:  Family member    Pertinent History  Patient is a 74 y.o. male who presents to outpatient physical therapy with a referral for medical diagnosis of CVA. This patient's chief complaints consist of left hemiplegia and overall deconditioning leading to the following functional deficits: dependent for ADLs, IADLs, unable to transfer to car, dependent transfers at home with hoyer lift, unable to walk or stand without significant assistance, difficulty with W/C navigation..    Currently in Pain?  Yes    Pain Score  8     Pain Location  Leg    Pain Orientation  Right;Left    Pain Descriptors / Indicators  Aching  Pain Type  Acute pain    Pain Onset  1 to 4 weeks ago    Pain Frequency  Intermittent    Aggravating Factors   unsure    Pain Relieving Factors  medication    Effect of Pain on Daily Activities  decreased activity tolerance;    Multiple Pain Sites  No             TREATMENT: Prior to transfersand in between transfers: Patient sittingin wheelchair: Instructed patient in LE strengthening exercise, applied 2# ankle weight for better strengthening: -hip flexion marchx15reps bilaterally -LAQx10 reps bilaterally with visual cues to increase ROM especially on LLE; Ptrequires verbal and visual cues to improve ROM; He was able to exhibit better trunk control but does have difficulty increasing LLE AROM this session;   Following exercise: Transferred sit<>Stand in parallel bars (seat adjusted to 27 inch from floor  height) x5reps throughout session;He was able to transfermin A-CGA withminVCs for forward weight shift for better trunk control and transfer ability;  Standing in parallel bars: Standing,CGA to close supervision with min VCs for weight shift and increased extension for better stance control; -Mini squat with B rail assist x15reps with cues to increase knee extension in BLE between repetitions; -weight shift to neutral, with cues for hip extension  -weight shift to neutral, heel raises x10 reps;  -RLE hip flexion marchx15reps with min A to shift to [place] therapist blocking LLE knee; -shifting to neutral positioning, standing with reducing rail assist to finger tip hold intermittently, min A for safety with increased weight bearing in BLE;   Patient initially exhibits flexed posture with decreased hip extension; He was able to exhibit improved hip extension during subsequent transfers; Although he continues to require min A for safety and trunk control;   Patient requiredmodA to scoot back in chair at end of session due to increased fatigue  Response to treatment:Tolerated fair. Patient heavily fatigued this session requiring increased seated rest breaks. He required increased assistance with forward weight shift and improved hip extension in stance. Patient required increased cues for trunk control and increased weight bearing in LE for better stance control.                     PT Education - 05/15/19 1307    Education Details  transfers/positioning, exercise, HEP    Person(s) Educated  Patient;Child(ren)    Methods  Explanation;Verbal cues    Comprehension  Verbalized understanding;Returned demonstration;Verbal cues required;Need further instruction       PT Short Term Goals - 05/08/19 1430      PT SHORT TERM GOAL #1   Title  Be independent with initial home exercise program for self-management of symptoms.    Baseline  HEP to be given at  second session; advice to practice unsupported sitting at home (06/19/2018); 07/08/18: Performing at home, 6/29: needs assistance but is doing exercise program regularly; 08/22/18: adherent;    Time  2    Period  Weeks    Status  Achieved    Target Date  06/26/18        PT Long Term Goals - 05/08/19 1431      PT LONG TERM GOAL #1   Title  Patient will complete all bed mobility with min A to improve functional independence for getting in and out of bed and adjusting in bed.     Baseline  max A - total A reported by family (06/19/2018); 07/08/18: still requires maxA, dtr reports  improvement in rolling since starting therapy; 6/23: min A to supervision in clinic; not doing at home right; 8/5: Pt's daughter states that his rolling has improved, but still has difficulty with all other bed mobility; 10/23/18: pts daughter states that she is doing about 35% of the work if he is in proper position, he does uses the bed rails to help roll;  using Harrel Lemon for coming to sit EOB; 10/12: pts daughter states that she is doing about 30% of the work if he is in proper position, he does uses the bed rails to help roll, but it is improving;  using Hoyer for coming to sit EOB, 10/29: min A for sit to sidelying, rolling supervision, able to transition sidelying to sit with HHA;    Time  12    Period  Weeks    Status  Achieved    Target Date  12/04/18      PT LONG TERM GOAL #2   Title  Patient will complete sit <> stand transfer chair to chair with Min A and LRAD to improve functional independence for household and community mobility.     Baseline  supervision for STS, still requires elevated surface height to 27"    Time  12    Period  Weeks    Status  Achieved    Target Date  05/23/19      PT LONG TERM GOAL #3   Title  Patient will navigate power w/c with min A x 100 feet to improve mobility for household and short community distances.    Baseline  required total A (06/12/2018); able to roll 20 feet with min A  (06/20/2018); 07/08/18: Pt can go approximately 10' without assist per family;  10/23/2018: pt's daughter states that his power WC mobility has become 100% better with the new hand control, slowed speed, and with practice; 10/12: pt's daughter reports that he can perform household WC mobility and limited community ambulation with minA    Time  12    Period  Weeks    Status  Achieved    Target Date  05/23/19      PT LONG TERM GOAL #4   Title  Patient will complete car transfers W/C to family SUV with min A using LRAD to improve his abilty to participate in community activities.     Baseline  unable to complete car transfers at all (06/12/2018); unable to complete car transfers at all (06/19/2018).; 07/08/18: Unable to perform at this time; 09/11/18: unable to perform at this time; 10/23/2018: unable to complete car transfers at all; 10/12:  unable to complete car transfers at all, 10/29: unable; 01/13/19: unable; 2/11: min-mod A +2, 3/10: min A+1    Time  12    Period  Weeks    Status  Achieved    Target Date  05/23/19      PT LONG TERM GOAL #5   Title  Patient will ambulate at least 10 feet with min A using LRAD to improve mobility for household and community distances.    Baseline  unable to take steps (06/12/2018); able to shift R leg forward and back with max A and RW at edge of plinth (06/19/2018); 07/08/18: unable to ambulate at this time. 09/11/18: unable to perform at this time; 10/23/2018: unable to perform at this time; 10/12: unable to perform at this time, 10/29: unable at this time; 01/13/19: unable at this time, 2/11: min A to step with RLE, unable to step with LLE,  3/10: able to take 2 steps in parallel bars with min A for safety; 4/1: min A for walking 10 feet in parallel bars    Time  12    Period  Weeks    Status  Partially Met    Target Date  05/23/19      PT LONG TERM GOAL #6   Title  Patient will be able to safety navigate up/down ramp with power chair, exhibiting good safety awareness, mod I to  safely enter/exit his home.    Baseline  2/11: supervision with intermittent min A;4/1: supervision;    Time  12    Period  Weeks    Status  Partially Met    Target Date  05/23/19      PT LONG TERM GOAL #7   Title  Patient will increase functional reach test to >15 inches in sitting to exhibit improved sitting balance and positioning and reduce fall risk;    Baseline  07/30/18: 12 inches; 09/11/18: 10-12 inches; 10/23/18: 12-13 inch; 10/12: 14.5", 10/29: 15.5 inch    Time  4    Period  Weeks    Status  Achieved    Target Date  05/23/19      PT LONG TERM GOAL #8   Title  Patient will increase LLE quad strength to 3+/5 to improve functional strength for standing and mobility;    Baseline  10/28: 2+/5; 01/13/19: 2+/5, 2/11: 3-/5, 04/16/19: 3-/5, 05/08/19: 3-/5    Time  12    Period  Weeks    Status  Partially Met    Target Date  05/23/19            Plan - 05/15/19 1523    Clinical Impression Statement  Patient motivated and participated well within session. Patient does reports increased fatigue this session. He was having increased restless legs and took gabapentin at start of session. He required increased cues for  positioning and trunk control. Instructed patient in LE strengthening but reduced resistance for better tolerance and ROM. Patient would benefit from additional skilled PT intervention to improve strength, balance and mobility;    Comorbidities  Relevant past medical history and comorbidities include long term steroid use for lupus, CKD, chronic osteomyelitis, cervical spine stenosis, BPH, APS, alcohol abuse, peripheral artery disease, depression, GERD, obstructive sleep apnea, HTN, IBS, IgA deficiency, lumbar radiculopathy, RA, Stroke, toxic maculopathy in both eyes, systemic lupus erythematosus related syndrome, cardiac catheterization, carotid endarterectomy, cervical laminectomy, coronary angioplasty, Fusion C5-C7, knee arthroplasty, lumbar surgery.    Rehab Potential  Fair     PT Frequency  3x / week    PT Duration  12 weeks    PT Treatment/Interventions  ADLs/Self Care Home Management;Electrical Stimulation;Moist Heat;Cryotherapy;Gait training;Stair training;Functional mobility training;Therapeutic exercise;Balance training;Neuromuscular re-education;Cognitive remediation;Patient/family education;Orthotic Fit/Training;Wheelchair mobility training;Manual techniques;Passive range of motion;Energy conservation;Joint Manipulations    PT Next Visit Plan  Work on strength, sitting/standing balance, functional activities such as rolling, bed mobility, transfers; caregiver training    PT Home Exercise Plan  no updates this session    Consulted and Agree with Plan of Care  Patient;Family member/caregiver    Family Member Consulted  daughter [name]       Patient will benefit from skilled therapeutic intervention in order to improve the following deficits and impairments:  Abnormal gait, Decreased activity tolerance, Decreased cognition, Decreased endurance, Decreased knowledge of use of DME, Decreased range of motion, Decreased skin integrity, Decreased strength, Impaired perceived functional ability, Impaired sensation, Impaired UE functional use,  Improper body mechanics, Pain, Cardiopulmonary status limiting activity, Decreased balance, Decreased coordination, Decreased mobility, Difficulty walking, Impaired tone, Postural dysfunction  Visit Diagnosis: Muscle weakness (generalized)  Other lack of coordination  Difficulty in walking, not elsewhere classified  Repeated falls     Problem List Patient Active Problem List   Diagnosis Date Noted  . Sepsis ([place]) 10/18/2018    Kenneth Summers PT, DPT 05/15/2019, 3:25 PM  [place] MAIN [facility] SERVICES [address] [place], [place], [address] Phone: [phone]   Fax:  [phone]  Name: Kenneth Summers MRN: [id number] Date of Birth: August 12, 1945

## 2019-05-19 ENCOUNTER — Ambulatory Visit: Payer: Medicare Other | Admitting: Physical Therapy

## 2019-05-19 ENCOUNTER — Ambulatory Visit: Payer: Medicare Other | Admitting: Occupational Therapy

## 2019-05-20 ENCOUNTER — Encounter: Payer: Self-pay | Admitting: Dermatology

## 2019-05-20 ENCOUNTER — Other Ambulatory Visit: Payer: Self-pay

## 2019-05-20 ENCOUNTER — Ambulatory Visit (INDEPENDENT_AMBULATORY_CARE_PROVIDER_SITE_OTHER): Payer: Medicare Other | Admitting: Dermatology

## 2019-05-20 DIAGNOSIS — L219 Seborrheic dermatitis, unspecified: Secondary | ICD-10-CM | POA: Diagnosis not present

## 2019-05-20 DIAGNOSIS — Z85828 Personal history of other malignant neoplasm of skin: Secondary | ICD-10-CM

## 2019-05-20 DIAGNOSIS — B353 Tinea pedis: Secondary | ICD-10-CM | POA: Diagnosis not present

## 2019-05-20 DIAGNOSIS — L905 Scar conditions and fibrosis of skin: Secondary | ICD-10-CM

## 2019-05-20 DIAGNOSIS — L578 Other skin changes due to chronic exposure to nonionizing radiation: Secondary | ICD-10-CM

## 2019-05-20 DIAGNOSIS — L570 Actinic keratosis: Secondary | ICD-10-CM | POA: Diagnosis not present

## 2019-05-20 DIAGNOSIS — D692 Other nonthrombocytopenic purpura: Secondary | ICD-10-CM

## 2019-05-20 MED ORDER — KETOCONAZOLE 2 % EX CREA: TOPICAL_CREAM | CUTANEOUS | 2 refills | Status: DC

## 2019-05-20 MED ORDER — HYDROCORTISONE 2.5 % EX CREA: TOPICAL_CREAM | CUTANEOUS | 3 refills | Status: DC

## 2019-05-20 NOTE — Patient Instructions (Addendum)
Cryotherapy Aftercare  . Wash gently with soap and water everyday.   Marland Kitchen Apply Vaseline and Band-Aid daily until healed.   .Seborrheic Dermatitis  Mix hydrocortisone with ketaconazole 2% twice a day. If improved, decrease to hydrocortisone and ketaconazole mixed once a day. If still clear, decrease to ketaconazole only.  May also use ketoconazole 2% cream to scaliness on feet and toes.

## 2019-05-20 NOTE — Progress Notes (Signed)
   Follow-Up Visit   Subjective  Kenneth Summers is a 74 y.o. male who presents for the following: Follow-up (Hx SCC - R mid lower ear helix, bx/edc 15/15/2020). Patient has a history of AKs treated on the face and back.  History of lesion on right mid dorsum foot improved with moisturizers. Patient also complains of an itchy scalp.  The following portions of the chart were reviewed this encounter and updated as appropriate:     Review of Systems: No other skin or systemic complaints.  Objective  Well appearing patient in no apparent distress; mood and affect are within normal limits.  A focused examination was performed including face, ears, right foot. Relevant physical exam findings are noted in the Assessment and Plan.  Objective  Right Mid Ear Helix x 2, right forearm x 1, R ear concha x 1, left forearm x 2, scalp x 3: Erythematous thin papules/macules with gritty scale.   Objective  Right Mid Lower Ear Helix: Well healed scar with no evidence of recurrence  Objective  Right Mid Dorsum Foot: Violaceous scar, no scale, much improved from previous visit  Objective  face: Pink patches with greasy scale of temples, upper forehead, sideburn/lateral cheeks.   Objective  feet, toes: Scaling and maceration web spaces and over distal and lateral soles.   Assessment & Plan   Actinic Damage - diffuse scaly erythematous macules with underlying dyspigmentation - Recommend daily broad spectrum sunscreen SPF 30+ to sun-exposed areas, reapply every 2 hours as needed.  - Call for new or changing lesions. Purpura - Violaceous macules and patches - Benign - Related to age, sun damage and/or use of blood thinners - Observe - Can use OTC arnica containing moisturizer such as Dermend Bruise Formula if desired - Call for worsening or other concerns     AK (actinic keratosis) Right Mid Ear Helix x 2, right forearm x 1, R ear concha x 1, left forearm x 2, scalp x 3  Destruction  of lesion - Right Mid Ear Helix x 2, right forearm x 1, R ear concha x 1, left forearm x 2, scalp x 3  Destruction method: cryotherapy   Destruction method comment:  Electrodessication Informed consent: discussed and consent obtained   Lesion destroyed using liquid nitrogen: Yes   Region frozen until ice ball extended beyond lesion: Yes   Outcome: patient tolerated procedure well with no complications   Post-procedure details: wound care instructions given   Additional details:  Prior to procedure, discussed risks of blister formation, small wound, skin dyspigmentation, or rare scar following cryotherapy.  History of SCC (squamous cell carcinoma) of skin Right Mid Lower Ear Helix  Clear. Observe for recurrence. Call clinic for new or changing lesions.  Recommend regular skin exams, daily broad-spectrum spf 30+ sunscreen use, and photoprotection.     Scar Right Mid Dorsum Foot  Benign, observe.    Seborrheic dermatitis face  HC 2.5% Cream Apply to Aas face qd/bid as directed. Ketoconazole 2% Cream Apply to Aas face qd/bid as directed. Pt given written instructions  hydrocortisone 2.5 % cream - face  ketoconazole (NIZORAL) 2 % cream - face  Tinea pedis of both feet feet, toes  Start ketoconazole 2% cream qd/bid AAs feet. Sample of Eucerin Roughness Relief Cream given.  Return in about 6 months (around 11/19/2019) for AKs, Hx SCC.   IJamesetta Orleans, CMA, am acting as scribe for Brendolyn Patty, MD .

## 2019-05-21 ENCOUNTER — Ambulatory Visit: Payer: Medicare Other | Admitting: Physical Therapy

## 2019-05-21 ENCOUNTER — Encounter: Payer: Self-pay | Admitting: Physical Therapy

## 2019-05-21 DIAGNOSIS — M6281 Muscle weakness (generalized): Secondary | ICD-10-CM | POA: Diagnosis not present

## 2019-05-21 DIAGNOSIS — R278 Other lack of coordination: Secondary | ICD-10-CM

## 2019-05-21 DIAGNOSIS — R262 Difficulty in walking, not elsewhere classified: Secondary | ICD-10-CM

## 2019-05-22 ENCOUNTER — Encounter: Payer: Self-pay | Admitting: Occupational Therapy

## 2019-05-22 ENCOUNTER — Encounter: Payer: Self-pay | Admitting: Physical Therapy

## 2019-05-22 ENCOUNTER — Ambulatory Visit: Payer: Medicare Other | Admitting: Physical Therapy

## 2019-05-22 ENCOUNTER — Ambulatory Visit: Payer: Medicare Other | Admitting: Occupational Therapy

## 2019-05-22 ENCOUNTER — Other Ambulatory Visit: Payer: Self-pay

## 2019-05-22 DIAGNOSIS — M6281 Muscle weakness (generalized): Secondary | ICD-10-CM

## 2019-05-22 DIAGNOSIS — R262 Difficulty in walking, not elsewhere classified: Secondary | ICD-10-CM

## 2019-05-22 DIAGNOSIS — R278 Other lack of coordination: Secondary | ICD-10-CM

## 2019-05-22 DIAGNOSIS — R296 Repeated falls: Secondary | ICD-10-CM

## 2019-05-22 NOTE — Therapy (Signed)
[place] MAIN [facility] SERVICES [address] [place], [place], [address] Phone: [phone]   Fax:  [phone]  Occupational Therapy Treatment  Patient Details  Name: Kenneth Summers MRN: [id number] Date of Birth: May 16, 1945 No data recorded  Encounter Date: 05/22/2019  OT End of Session - 05/22/19 1307    Visit Number  104    Number of Visits  157    Date for OT Re-Evaluation  06/30/19    OT Start Time  1300    OT Stop Time  1345    OT Time Calculation (min)  45 min    Activity Tolerance  Patient tolerated treatment well;Patient limited by fatigue    Behavior During Therapy  [facility] for tasks assessed/performed       Past Medical History:  Diagnosis Date  . Actinic keratosis   . Alcohol abuse   . APS (antiphospholipid syndrome) ([place])   . Basal cell carcinoma   . BPH (benign prostatic hyperplasia)   . CAD (coronary artery disease)   . Cellulitis   . Cervical spinal stenosis   . Chronic osteomyelitis ([place])   . CKD (chronic kidney disease)   . CKD (chronic kidney disease)   . Clostridium difficile diarrhea   . Depression   . Diplopia   . Fatigue   . GERD (gastroesophageal reflux disease)   . Hyperlipemia   . Hypertension   . Hypovitaminosis D   . IBS (irritable bowel syndrome)   . 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 Insomnia   . Left lumbosacral radiculopathy   . Moderate obstructive sleep apnea   . Osteomyelitis of foot ([place])   . PAD (peripheral artery disease) ([place])   . Pruritus   . RA (rheumatoid arthritis) ([place])   . Radiculopathy   . Restless legs syndrome   . RLS (restless legs syndrome)   . SLE (systemic lupus erythematosus related syndrome) ([place])   . 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 Stroke ([place])   . Toxic maculopathy of both eyes     Past Surgical History:  Procedure Laterality Date  . CARDIAC CATHETERIZATION    . CAROTID ENDARTERECTOMY    . CERVICAL LAMINECTOMY    . CORONARY  ANGIOPLASTY    . FRACTURE SURGERY    . fusion C5-6-7    . HERNIA REPAIR    . KNEE ARTHROSCOPY    . TONSILLECTOMY      There were no vitals filed for this visit.  Subjective Assessment - 05/22/19 1306    Subjective   Pt. reports left LLE pain    Patient is accompanied by:  Family member    Pertinent History  Pt. is a 74 y.o. 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 Pt. PMHx includes: Multiple Falls, Lupus, DJD, Renal Abscess, CVA. Pt. resides with his wife. Pt.'s wife and daughter assist with ADLs. Pt. has caregivers in  for 2 hours a day, 6 days a week. Pt. received Rehab services in acute care, at SNF for STR, and [place] services. Pt. is retired from [employer] for [employer] and [employer].    Currently in Pain?  No/denies      OT TREATMENT    Neuro muscular re-education:  Pt. worked on grasping, and manipulating 1", 3/4", and 1/2" washers from a magnetic dish using 2pt. pinch grasp pattern. Pt. worked on reaching up, stabilizing, and sustaining shoulder elevation while placing  the washers over a small precise target on vertical dowels positioned at various angles. Emphasis was placed on leaning forward with trunk flexion to extend the LUE reach. Pt. required maxA. Pt. performed [facility] tasks using the grooved pegboard. Pt. worked on grasping the grooved pegs from a horizontal position, and moving the pegs to a vertical position in the hand to prepare for placing them in the grooved slot.   Response to Treatment:  Pt. tolerated the session well today. Pt. was able to flex forward further with the trunk when reaching with the LUE. No rest breaks were required today. Pt. r equired cues to initiate donning the jacket, as pt. continuously attempted to donn the jacket backwards. Pt. continues to work on improving LUE strength, and [facility] skills  in order to improve functional reaching for items, strengthen the core, the LUE, and inprove LUE functioning  during ADLs, and IADL tasks.                          OT Education - 05/22/19 1307    Education Details  Left UE functioning, left sided awareness.    Person(s) Educated  Patient    Methods  Explanation;Demonstration    Comprehension  Returned demonstration;Tactile cues required;Verbalized understanding;Need further instruction          OT Long Term Goals - 05/01/19 1309      OT LONG TERM GOAL #1   Title  Pt. will increase left shoulder flexion AROM by 10 degrees to access cabinet/shelf.    Baseline  Shoulder flexion:125. Pt. continues to work on reaching with increasing emphasis on flexing trunk to improve functional reach.    Time  12    Period  Weeks    Status  Partially Met    Target Date  06/30/19      OT LONG TERM GOAL #2   Title  Pt. will donn a shirt with Supervision.    Baseline  Pt. continues to require MinA donning a zip down jacket with consistent cues to initiate the correct direction of the jacket.    Time  12    Period  Weeks    Status  On-going    Target Date  06/30/19      OT LONG TERM GOAL #4   Title  Pt. will improve left grip strength by 10# to be able to open a jar/container.    Baseline  Pt. continues to progress with grip strength, and continues to work towards opening containers, and bottles.    Time  12    Period  Weeks    Status  On-going    Target Date  06/30/19      OT LONG TERM GOAL #5   Title  Pt. will improve left hand [facility] skills to be able to assist with buttoning/zipping.    Baseline  Pt. is improving with manipulating the zipper on his jacket, however continues to require practice. Pt. continues to work on improving bilateral [facility] skills for buttoning/zipping    Time  12    Period  Weeks    Status  On-going    Target Date  06/30/19      OT LONG TERM GOAL #6   Title  Pt. will demonstrate visual conmpensatory strategies for 100% of the time during ADLs    Baseline  Pt. requires cues for left sides awareness.     Time  12    Period  Weeks  Status  On-going    Target Date  06/30/19      OT LONG TERM GOAL #8   Title  Pt. will accurately identify potential safety hazard using good safety awareness, and judgement 100% for ADLs, and IADLs.    Baseline  Pt. requires cues    Time  12    Period  Weeks    Status  On-going    Target Date  06/30/19      OT LONG TERM GOAL  #9   TITLE  Pt. will  navigate his w/c around obstacles with Supervision and 100% accuracy    Baseline  Pt. continues to require cuing for obstacle on the left, however requires less cues.    Time  12    Period  Weeks    Status  On-going    Target Date  06/30/19      OT LONG TERM GOAL  #11   TITLE  Pt. will increase BUE strength by 12m grades in preparation for ADL transfers.    Baseline  Pt. continues to present with limited BUE strength    Time  12    Period  Weeks    Status  On-going    Target Date  06/30/19            Plan - 05/22/19 1309    Clinical Impression Statement  Pt. tolerated the session well today. Pt. was able to flex forward further with the trunk when reaching with the LUE. No rest breaks were required today. Pt. r equired cues to initiate donning the jacket, as pt. continuously attempted to donn the jacket backwards. Pt. continues to work on improving LUE strength, and [facility]  in order to improve functional reaching for items, strengthen the core, the LUE, and inprove LUE functioning during ADLs, and IADL tasks.   OT Occupational Profile and History  Problem Focused Assessment - Including review of records relating to presenting problem    Occupational performance deficits (Please refer to evaluation for details):  ADL's    Body Structure / Function / Physical Skills  UE functional use;Coordination;FMC;Dexterity;Strength;ROM    Cognitive Skills  Attention;Memory;Emotional;Problem Solve;Safety Awareness    Rehab Potential  Good    Clinical Decision Making  Several treatment options, min-mod task  modification necessary    Comorbidities Affecting Occupational Performance:  Presence of comorbidities impacting occupational performance    Comorbidities impacting occupational performance description:  Phyical, cognitive, visual,  medical comorbidities    Modification or Assistance to Complete Evaluation   No modification of tasks or assist necessary to complete eval    OT Frequency  2x / week    OT Duration  12 weeks    OT Treatment/Interventions  Self-care/ADL training;DME and/or AE instruction;Therapeutic exercise;Therapeutic activities;Moist Heat;Cognitive remediation/compensation;Neuromuscular education;Visual/perceptual remediation/compensation;Coping strategies training;Patient/family education;Passive range of motion;Psychosocial skills training;Energy conservation;Functional Mobility Training    Consulted and Agree with Plan of Care  Patient    Family Member Consulted  Daughter JMarcie Bal      Patient will benefit from skilled therapeutic intervention in order to improve the following deficits and impairments:   Body Structure / Function / Physical Skills: UE functional use, Coordination, FMC, Dexterity, Strength, ROM Cognitive Skills: Attention, Memory, Emotional, Problem Solve, Safety Awareness     Visit Diagnosis: Muscle weakness (generalized)  Other lack of coordination    Problem List Patient Active Problem List   Diagnosis Date Noted  . Sepsis ([place] 10/18/2018    EHarrel Carina, MS, OTR/L 05/22/2019, 1:13  PM  [place] MAIN [facility] SERVICES [address] [place], [place], [address] Phone: [phone]   Fax:  [phone]  Name: Kenneth Summers MRN: [id number] Date of Birth: Jun 30, 1945

## 2019-05-22 NOTE — Therapy (Signed)
[place] MAIN [facility] SERVICES [address] [place], [place], [address] Phone: [phone]   Fax:  [phone]  Physical Therapy Treatment  Patient Details  Name: Kenneth Summers MRN: [id number] Date of Birth: [birth year] Referring Provider (PT): Dr. [name], Guy Begin   Encounter Date: 05/22/2019  PT End of Session - 05/22/19 1356    Visit Number  125    Number of Visits  144    Date for PT Re-Evaluation  05/23/19    Authorization Time Period  goals last assessed 03/19/19    PT Start Time  1346    PT Stop Time  1430    PT Time Calculation (min)  44 min    Equipment Utilized During Treatment  Gait belt    Activity Tolerance  Patient tolerated treatment well;No increased pain    Behavior During Therapy  WFL for tasks assessed/performed       Past Medical History:  Diagnosis Date  . Actinic keratosis   . Alcohol abuse   . APS (antiphospholipid syndrome) ([place])   . Basal cell carcinoma   . BPH (benign prostatic hyperplasia)   . CAD (coronary artery disease)   . Cellulitis   . Cervical spinal stenosis   . Chronic osteomyelitis ([place])   . CKD (chronic kidney disease)   . CKD (chronic kidney disease)   . Clostridium difficile diarrhea   . Depression   . Diplopia   . Fatigue   . GERD (gastroesophageal reflux disease)   . Hyperlipemia   . Hypertension   . Hypovitaminosis D   . IBS (irritable bowel syndrome)   . IgA deficiency ([place])   . Insomnia   . Left lumbosacral radiculopathy   . Moderate obstructive sleep apnea   . Osteomyelitis of foot ([place])   . PAD (peripheral artery disease) ([place])   . Pruritus   . RA (rheumatoid arthritis) ([place])   . Radiculopathy   . Restless legs syndrome   . RLS (restless legs syndrome)   . SLE (systemic lupus erythematosus related syndrome) ([place])   . 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 Stroke ([place])   . Toxic maculopathy of both eyes     Past Surgical  History:  Procedure Laterality Date  . CARDIAC CATHETERIZATION    . CAROTID ENDARTERECTOMY    . CERVICAL LAMINECTOMY    . CORONARY ANGIOPLASTY    . FRACTURE SURGERY    . fusion C5-6-7    . HERNIA REPAIR    . KNEE ARTHROSCOPY    . TONSILLECTOMY      There were no vitals filed for this visit.  Subjective Assessment - 05/22/19 1355    Subjective  Reports doing well; no pain; Reports minimal fatigue today;    Patient is accompained by:  Family member    Pertinent History  Patient is a 74 y.o. male who presents to outpatient physical therapy with a referral for medical diagnosis of CVA. This patient's chief complaints consist of left hemiplegia and overall deconditioning leading to the following functional deficits: dependent for ADLs, IADLs, unable to transfer to car, dependent transfers at home with hoyer lift, unable to walk or stand without significant assistance, difficulty with W/C navigation..    Currently in Pain?  No/denies    Pain Onset  1 to 4 weeks ago    Multiple Pain Sites  No            TREATMENT: Prior to  transfersand in between transfers: Patient sittingin wheelchair: Instructed patient in LE strengthening exercise, applied 2# ankle weight for better strengthening: -hip flexion [id number] bilaterally Ptrequires verbal and visual cues to improve ROM; He was able to exhibit better trunk control but does have difficulty increasing LLE AROM this session;   Following exercise: Transferred sit<>Stand in parallel bars (seat adjusted to 27 inch from floor height) x5reps throughout session;He was able to transfermin A-CGA withminVCs for forward weight shift for better trunk control and transfer ability;  Standing in parallel bars: Standing,CGA to close supervision with min VCs for weight shift and increased extension for better stance control; -Mini squat with B rail assist x15reps with cues to increase knee extension in BLE between  repetitions; -weight shift to neutral, with cues for hip extension  -RLE step forward and backward x10 reps with min A to shift to [place] therapist blocking LLE knee;   Patient initially exhibits flexed posture with decreased hip extension; He was able to exhibit improved hip extension during subsequent transfers; Although he continues to require min A for safety and trunk control;  Progressed activity to Gait training: Patient stood in parallel bars, supervision with CGA for steadying upon standing. Patient ambulated 3-4 steps x2 sets being able to advance LLE with step to pattern; Requires min A for weight shift and trunk control. Patient heavily fatigued requiring short seated rest breaks;   Patient required minA to scoot back in chair at end of session due to increased fatigue  Response to treatment: [place]. Patient pleased with his ability to walk in parallel bars. He exhibits improved LE ROM this session with better LLE knee extension in stance. He also exhibits better trunk control with less posterior lean and better standing balance.                    PT Education - 05/22/19 1356    Education Details  transfers/positioning; HEP    Person(s) Educated  Patient;Child(ren)    Methods  Explanation;Verbal cues    Comprehension  Verbalized understanding;Returned demonstration;Verbal cues required;Need further instruction       PT Short Term Goals - 05/08/19 1430      PT SHORT TERM GOAL #1   Title  Be independent with initial home exercise program for self-management of symptoms.    Baseline  HEP to be given at second session; advice to practice unsupported sitting at home (06/19/2018); 07/08/18: Performing at home, 6/29: needs assistance but is doing exercise program regularly; 08/22/18: adherent;    Time  2    Period  Weeks    Status  Achieved    Target Date  06/26/18        PT Long Term Goals - 05/08/19 1431      PT LONG TERM GOAL #1    Title  Patient will complete all bed mobility with min A to improve functional independence for getting in and out of bed and adjusting in bed.     Baseline  max A - total A reported by family (06/19/2018); 07/08/18: still requires maxA, dtr reports improvement in rolling since starting therapy; 6/23: min A to supervision in clinic; not doing at home right; 8/5: Pt's daughter states that his rolling has improved, but still has difficulty with all other bed mobility; 10/23/18: pts daughter states that she is doing about 35% of the work if he is in proper position, he does uses the bed rails to help roll;  using [place] for coming to sit  EOB; 10/12: pts daughter states that she is doing about 30% of the work if he is in proper position, he does uses the bed rails to help roll, but it is improving;  using Harrel Lemon for coming to sit EOB, 10/29: min A for sit to sidelying, rolling supervision, able to transition sidelying to sit with HHA;    Time  12    Period  Weeks    Status  Achieved    Target Date  12/04/18      PT LONG TERM GOAL #2   Title  Patient will complete sit <> stand transfer chair to chair with Min A and LRAD to improve functional independence for household and community mobility.     Baseline  supervision for STS, still requires elevated surface height to 27"    Time  12    Period  Weeks    Status  Achieved    Target Date  05/23/19      PT LONG TERM GOAL #3   Title  Patient will navigate power w/c with min A x 100 feet to improve mobility for household and short community distances.    Baseline  required total A (06/12/2018); able to roll 20 feet with min A (06/20/2018); 07/08/18: Pt can go approximately 10' without assist per family;  10/23/2018: pt's daughter states that his power WC mobility has become 100% better with the new hand control, slowed speed, and with practice; 10/12: pt's daughter reports that he can perform household WC mobility and limited community ambulation with minA    Time  12     Period  Weeks    Status  Achieved    Target Date  05/23/19      PT LONG TERM GOAL #4   Title  Patient will complete car transfers W/C to family SUV with min A using LRAD to improve his abilty to participate in community activities.     Baseline  unable to complete car transfers at all (06/12/2018); unable to complete car transfers at all (06/19/2018).; 07/08/18: Unable to perform at this time; 09/11/18: unable to perform at this time; 10/23/2018: unable to complete car transfers at all; 10/12:  unable to complete car transfers at all, 10/29: unable; 01/13/19: unable; 2/11: min-mod A +2, 3/10: min A+1    Time  12    Period  Weeks    Status  Achieved    Target Date  05/23/19      PT LONG TERM GOAL #5   Title  Patient will ambulate at least 10 feet with min A using LRAD to improve mobility for household and community distances.    Baseline  unable to take steps (06/12/2018); able to shift R leg forward and back with max A and RW at edge of plinth (06/19/2018); 07/08/18: unable to ambulate at this time. 09/11/18: unable to perform at this time; 10/23/2018: unable to perform at this time; 10/12: unable to perform at this time, 10/29: unable at this time; 01/13/19: unable at this time, 2/11: min A to step with RLE, unable to step with LLE, 3/10: able to take 2 steps in parallel bars with min A for safety; 4/1: min A for walking 10 feet in parallel bars    Time  12    Period  Weeks    Status  Partially Met    Target Date  05/23/19      PT LONG TERM GOAL #6   Title  Patient will be able to safety navigate  up/down ramp with power chair, exhibiting good safety awareness, mod I to safely enter/exit his home.    Baseline  2/11: supervision with intermittent min A;4/1: supervision;    Time  12    Period  Weeks    Status  Partially Met    Target Date  05/23/19      PT LONG TERM GOAL #7   Title  Patient will increase functional reach test to >15 inches in sitting to exhibit improved sitting balance and positioning and  reduce fall risk;    Baseline  07/30/18: 12 inches; 09/11/18: 10-12 inches; 10/23/18: 12-13 inch; 10/12: 14.5", 10/29: 15.5 inch    Time  4    Period  Weeks    Status  Achieved    Target Date  05/23/19      PT LONG TERM GOAL #8   Title  Patient will increase LLE quad strength to 3+/5 to improve functional strength for standing and mobility;    Baseline  10/28: 2+/5; 01/13/19: 2+/5, 2/11: 3-/5, 04/16/19: 3-/5, 05/08/19: 3-/5    Time  12    Period  Weeks    Status  Partially Met    Target Date  05/23/19            Plan - 05/22/19 1514    Clinical Impression Statement  Patient motivated and participated well within session. He was able to exhibit increased LLE muscle activation this session with better hip/knee extension with increased weight bearing to LLE. Able to progress gait training with patient being able to take a few steps with LLE. He was heavily fatigued at end of session. Patient would benefit from additional PT intervention to improve strength, balance and mobility;    Comorbidities  Relevant past medical history and comorbidities include long term steroid use for lupus, CKD, chronic osteomyelitis, cervical spine stenosis, BPH, APS, alcohol abuse, peripheral artery disease, depression, GERD, obstructive sleep apnea, HTN, IBS, IgA deficiency, lumbar radiculopathy, RA, Stroke, toxic maculopathy in both eyes, systemic lupus erythematosus related syndrome, cardiac catheterization, carotid endarterectomy, cervical laminectomy, coronary angioplasty, Fusion C5-C7, knee arthroplasty, lumbar surgery.    Rehab Potential  Fair    PT Frequency  3x / week    PT Duration  12 weeks    PT Treatment/Interventions  ADLs/Self Care Home Management;Electrical Stimulation;Moist Heat;Cryotherapy;Gait training;Stair training;Functional mobility training;Therapeutic exercise;Balance training;Neuromuscular re-education;Cognitive remediation;Patient/family education;Orthotic Fit/Training;Wheelchair mobility  training;Manual techniques;Passive range of motion;Energy conservation;Joint Manipulations    PT Next Visit Plan  Work on strength, sitting/standing balance, functional activities such as rolling, bed mobility, transfers; caregiver training    PT Home Exercise Plan  no updates this session    Consulted and Agree with Plan of Care  Patient;Family member/caregiver    Family Member Consulted  daughter [name]       Patient will benefit from skilled therapeutic intervention in order to improve the following deficits and impairments:  Abnormal gait, Decreased activity tolerance, Decreased cognition, Decreased endurance, Decreased knowledge of use of DME, Decreased range of motion, Decreased skin integrity, Decreased strength, Impaired perceived functional ability, Impaired sensation, Impaired UE functional use, Improper body mechanics, Pain, Cardiopulmonary status limiting activity, Decreased balance, Decreased coordination, Decreased mobility, Difficulty walking, Impaired tone, Postural dysfunction  Visit Diagnosis: Muscle weakness (generalized)  Other lack of coordination  Difficulty in walking, not elsewhere classified  Repeated falls     Problem List Patient Active Problem List   Diagnosis Date Noted  . Sepsis ([place]) 10/18/2018    Meili Kleckley PT, DPT 05/22/2019, 3:52 PM  [place] MAIN [facility] SERVICES [address] [place], [place], [address] Phone: [phone]   Fax:  [phone]  Name: KEI MCELHINEY MRN: [id number] Date of Birth: [birth year]

## 2019-05-22 NOTE — Therapy (Signed)
[place] MAIN [facility] SERVICES [address] [place], [place], [address] Phone: [phone]   Fax:  [phone]  Physical Therapy Treatment  Patient Details  Name: Kenneth Summers MRN: [id number] Date of Birth: September 26, 1945 Referring Provider (PT): Dr. [name], Guy Begin   Encounter Date: 05/21/2019  PT End of Session - 05/21/19 1457    Visit Number  [id number]    Number of Visits  144    Date for PT Re-Evaluation  05/23/19    Authorization Time Period  goals last assessed 03/19/19    PT Start Time  1302    PT Stop Time  1332    PT Time Calculation (min)  30 min    Equipment Utilized During Treatment  Gait belt    Activity Tolerance  Patient tolerated treatment well;No increased pain    Behavior During Therapy  WFL for tasks assessed/performed       Past Medical History:  Diagnosis Date  . Actinic keratosis   . Alcohol abuse   . APS (antiphospholipid syndrome) ([place])   . Basal cell carcinoma   . BPH (benign prostatic hyperplasia)   . CAD (coronary artery disease)   . Cellulitis   . Cervical spinal stenosis   . Chronic osteomyelitis ([place])   . CKD (chronic kidney disease)   . CKD (chronic kidney disease)   . Clostridium difficile diarrhea   . Depression   . Diplopia   . Fatigue   . GERD (gastroesophageal reflux disease)   . Hyperlipemia   . Hypertension   . Hypovitaminosis D   . IBS (irritable bowel syndrome)   . 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 Insomnia   . Left lumbosacral radiculopathy   . Moderate obstructive sleep apnea   . Osteomyelitis of foot ([place])   . PAD (peripheral artery disease) ([place])   . Pruritus   . RA (rheumatoid arthritis) ([place])   . Radiculopathy   . Restless legs syndrome   . RLS (restless legs syndrome)   . SLE (systemic lupus erythematosus related syndrome) ([place])   . 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 Stroke ([place])   . Toxic maculopathy of both eyes     Past Surgical  History:  Procedure Laterality Date  . CARDIAC CATHETERIZATION    . CAROTID ENDARTERECTOMY    . CERVICAL LAMINECTOMY    . CORONARY ANGIOPLASTY    . FRACTURE SURGERY    . fusion C5-6-7    . HERNIA REPAIR    . KNEE ARTHROSCOPY    . TONSILLECTOMY      There were no vitals filed for this visit.  Subjective Assessment - 05/21/19 1451    Subjective  Patient reports increased fatigue today. He denies any dizziness; He reports feeling better after monday.    Patient is accompained by:  Family member    Pertinent History  Patient is a 74 y.o. male who presents to outpatient physical therapy with a referral for medical diagnosis of CVA. This patient's chief complaints consist of left hemiplegia and overall deconditioning leading to the following functional deficits: dependent for ADLs, IADLs, unable to transfer to car, dependent transfers at home with hoyer lift, unable to walk or stand without significant assistance, difficulty with W/C navigation..    Currently in Pain?  No/denies    Pain Onset  1 to 4 weeks ago          TREATMENT: Prior to transfersand in between transfers:  Patient sittingin wheelchair: Instructed patient in LE strengthening exercise,  -hip flexion [id number] bilaterally Ptrequires verbal and visual cues to improve ROM; He was able to exhibit better trunk control but does have difficulty increasing LLE AROM this session;   Following exercise: Transferred sit<>Stand in parallel bars (seat adjusted to 27 inch from floor height) x3reps throughout session;He was able to transfermin A-CGA withminVCs for forward weight shift for better trunk control and transfer ability;  Standing in parallel bars: Standing,CGA to close supervision with min VCs for weight shift and increased extension for better stance control; -weight shift to neutral, with cues for hip extension  -side/side weight shift to encourage increased weight bearing in LLE x10 reps; min A for  safety;  Patient initially exhibits flexed posture with decreased hip extension; He was able to exhibit improved hip extension during subsequent transfers; Although he continues to require min A for safety and trunk control;   Patient requiredmodA to scoot back in chair at end of session due to increased fatigue  Response to treatment:Tolerated fair. During 2nd stand, patient sat down due to increased fatigue. Upon sitting, instructed patient in seated hip flexion march to improve hip strengthening. However during exercise, patient became unresponsive. He was awake  But not responding to questions asking if he was okay. This lasted approximately 10-15 sec. He reports being unsure what happened and seemed confused. PT assessed vitals, BP 133/61, HR and SPO2 are WNL; He reports feeling mildly dizzy but mostly feeling increased fatigue; Session ended early due to increased fatigue;                        PT Education - 05/21/19 1457    Education Details  transfers/positioning, HEP    Person(s) Educated  Patient;Child(ren)    Methods  Explanation;Verbal cues    Comprehension  Verbalized understanding;Returned demonstration;Verbal cues required;Need further instruction       PT Short Term Goals - 05/08/19 1430      PT SHORT TERM GOAL #1   Title  Be independent with initial home exercise program for self-management of symptoms.    Baseline  HEP to be given at second session; advice to practice unsupported sitting at home (06/19/2018); 07/08/18: Performing at home, 6/29: needs assistance but is doing exercise program regularly; 08/22/18: adherent;    Time  2    Period  Weeks    Status  Achieved    Target Date  06/26/18        PT Long Term Goals - 05/08/19 1431      PT LONG TERM GOAL #1   Title  Patient will complete all bed mobility with min A to improve functional independence for getting in and out of bed and adjusting in bed.     Baseline  max A - total A  reported by family (06/19/2018); 07/08/18: still requires maxA, dtr reports improvement in rolling since starting therapy; 6/23: min A to supervision in clinic; not doing at home right; 8/5: Pt's daughter states that his rolling has improved, but still has difficulty with all other bed mobility; 10/23/18: pts daughter states that she is doing about 35% of the work if he is in proper position, he does uses the bed rails to help roll;  using Hoyer for coming to sit EOB; 10/12: pts daughter states that she is doing about 30% of the work if he is in proper position, he does uses the bed rails to help roll, but it  is improving;  using Hoyer for coming to sit EOB, 10/29: min A for sit to sidelying, rolling supervision, able to transition sidelying to sit with HHA;    Time  12    Period  Weeks    Status  Achieved    Target Date  12/04/18      PT LONG TERM GOAL #2   Title  Patient will complete sit <> stand transfer chair to chair with Min A and LRAD to improve functional independence for household and community mobility.     Baseline  supervision for STS, still requires elevated surface height to 27"    Time  12    Period  Weeks    Status  Achieved    Target Date  05/23/19      PT LONG TERM GOAL #3   Title  Patient will navigate power w/c with min A x 100 feet to improve mobility for household and short community distances.    Baseline  required total A (06/12/2018); able to roll 20 feet with min A (06/20/2018); 07/08/18: Pt can go approximately 10' without assist per family;  10/23/2018: pt's daughter states that his power WC mobility has become 100% better with the new hand control, slowed speed, and with practice; 10/12: pt's daughter reports that he can perform household WC mobility and limited community ambulation with minA    Time  12    Period  Weeks    Status  Achieved    Target Date  05/23/19      PT LONG TERM GOAL #4   Title  Patient will complete car transfers W/C to family SUV with min A using LRAD  to improve his abilty to participate in community activities.     Baseline  unable to complete car transfers at all (06/12/2018); unable to complete car transfers at all (06/19/2018).; 07/08/18: Unable to perform at this time; 09/11/18: unable to perform at this time; 10/23/2018: unable to complete car transfers at all; 10/12:  unable to complete car transfers at all, 10/29: unable; 01/13/19: unable; 2/11: min-mod A +2, 3/10: min A+1    Time  12    Period  Weeks    Status  Achieved    Target Date  05/23/19      PT LONG TERM GOAL #5   Title  Patient will ambulate at least 10 feet with min A using LRAD to improve mobility for household and community distances.    Baseline  unable to take steps (06/12/2018); able to shift R leg forward and back with max A and RW at edge of plinth (06/19/2018); 07/08/18: unable to ambulate at this time. 09/11/18: unable to perform at this time; 10/23/2018: unable to perform at this time; 10/12: unable to perform at this time, 10/29: unable at this time; 01/13/19: unable at this time, 2/11: min A to step with RLE, unable to step with LLE, 3/10: able to take 2 steps in parallel bars with min A for safety; 4/1: min A for walking 10 feet in parallel bars    Time  12    Period  Weeks    Status  Partially Met    Target Date  05/23/19      PT LONG TERM GOAL #6   Title  Patient will be able to safety navigate up/down ramp with power chair, exhibiting good safety awareness, mod I to safely enter/exit his home.    Baseline  2/11: supervision with intermittent min A;4/1: supervision;  Time  12    Period  Weeks    Status  Partially Met    Target Date  05/23/19      PT LONG TERM GOAL #7   Title  Patient will increase functional reach test to >15 inches in sitting to exhibit improved sitting balance and positioning and reduce fall risk;    Baseline  07/30/18: 12 inches; 09/11/18: 10-12 inches; 10/23/18: 12-13 inch; 10/12: 14.5", 10/29: 15.5 inch    Time  4    Period  Weeks    Status  Achieved     Target Date  05/23/19      PT LONG TERM GOAL #8   Title  Patient will increase LLE quad strength to 3+/5 to improve functional strength for standing and mobility;    Baseline  10/28: 2+/5; 01/13/19: 2+/5, 2/11: 3-/5, 04/16/19: 3-/5, 05/08/19: 3-/5    Time  12    Period  Weeks    Status  Partially Met    Target Date  05/23/19            Plan - 05/21/19 1458    Clinical Impression Statement  Patient motivated, but was more fatigued this session. Instructed patient in sit<>Stand transfers. During 2nd transfer he reports feeling increased fatigue. While sitting and doing LE exercise, patient started staring off into space and was unresponsive. PT assessed vitals, WNL; Caregiver reports patient has not been like that before. He reports feeling increased fatigue and dizziness. Stopped session. Will follow up with neurologist regarding referral for BPPV. He would benefit from additional skilled PT intervention to improve strength, balance and mobility;    Comorbidities  Relevant past medical history and comorbidities include long term steroid use for lupus, CKD, chronic osteomyelitis, cervical spine stenosis, BPH, APS, alcohol abuse, peripheral artery disease, depression, GERD, obstructive sleep apnea, HTN, IBS, IgA deficiency, lumbar radiculopathy, RA, Stroke, toxic maculopathy in both eyes, systemic lupus erythematosus related syndrome, cardiac catheterization, carotid endarterectomy, cervical laminectomy, coronary angioplasty, Fusion C5-C7, knee arthroplasty, lumbar surgery.    Rehab Potential  Fair    PT Frequency  3x / week    PT Duration  12 weeks    PT Treatment/Interventions  ADLs/Self Care Home Management;Electrical Stimulation;Moist Heat;Cryotherapy;Gait training;Stair training;Functional mobility training;Therapeutic exercise;Balance training;Neuromuscular re-education;Cognitive remediation;Patient/family education;Orthotic Fit/Training;Wheelchair mobility training;Manual  techniques;Passive range of motion;Energy conservation;Joint Manipulations    PT Next Visit Plan  Work on strength, sitting/standing balance, functional activities such as rolling, bed mobility, transfers; caregiver training    PT Home Exercise Plan  no updates this session    Consulted and Agree with Plan of Care  Patient;Family member/caregiver    Family Member Consulted  daughter [name]       Patient will benefit from skilled therapeutic intervention in order to improve the following deficits and impairments:  Abnormal gait, Decreased activity tolerance, Decreased cognition, Decreased endurance, Decreased knowledge of use of DME, Decreased range of motion, Decreased skin integrity, Decreased strength, Impaired perceived functional ability, Impaired sensation, Impaired UE functional use, Improper body mechanics, Pain, Cardiopulmonary status limiting activity, Decreased balance, Decreased coordination, Decreased mobility, Difficulty walking, Impaired tone, Postural dysfunction  Visit Diagnosis: Muscle weakness (generalized)  Other lack of coordination  Difficulty in walking, not elsewhere classified     Problem List Patient Active Problem List   Diagnosis Date Noted  . Sepsis ([place]) 10/18/2018    Dalessandro Baldyga PT, DPT 05/22/2019, 7:58 AM  [place] MAIN [facility] SERVICES [address] [place], [place], [address] Phone:  [phone]   Fax:  [phone]  Name: Kenneth Summers MRN: [id number] Date of Birth: 05-Jan-1946

## 2019-05-26 ENCOUNTER — Ambulatory Visit: Payer: Medicare Other | Admitting: Physical Therapy

## 2019-05-26 ENCOUNTER — Ambulatory Visit: Payer: Medicare Other | Admitting: Occupational Therapy

## 2019-05-26 ENCOUNTER — Encounter: Payer: Self-pay | Admitting: Occupational Therapy

## 2019-05-26 ENCOUNTER — Other Ambulatory Visit: Payer: Self-pay

## 2019-05-26 DIAGNOSIS — R278 Other lack of coordination: Secondary | ICD-10-CM

## 2019-05-26 DIAGNOSIS — M6281 Muscle weakness (generalized): Secondary | ICD-10-CM

## 2019-05-26 DIAGNOSIS — R296 Repeated falls: Secondary | ICD-10-CM

## 2019-05-26 DIAGNOSIS — R262 Difficulty in walking, not elsewhere classified: Secondary | ICD-10-CM

## 2019-05-26 NOTE — Therapy (Addendum)
[place] MAIN [facility] SERVICES [address] [place], [place], [address] Phone: [phone]   Fax:  [phone]  Occupational Therapy Treatment  Patient Details  Name: Kenneth Summers MRN: [id number] Date of Birth: April 27, 1945 No data recorded  Encounter Date: 05/26/2019  OT End of Session - 05/26/19 1400    Visit Number  [id number]    Number of Visits  157    Date for OT Re-Evaluation  06/30/19    Authorization Type  Progress report period starting 12/26/2018    OT Start Time  1349    OT Stop Time  1430    OT Time Calculation (min)  41 min    Activity Tolerance  Patient tolerated treatment well;Patient limited by fatigue    Behavior During Therapy  [facility] for tasks assessed/performed       Past Medical History:  Diagnosis Date  . Actinic keratosis   . Alcohol abuse   . APS (antiphospholipid syndrome) ([place])   . Basal cell carcinoma   . BPH (benign prostatic hyperplasia)   . CAD (coronary artery disease)   . Cellulitis   . Cervical spinal stenosis   . Chronic osteomyelitis ([place])   . CKD (chronic kidney disease)   . CKD (chronic kidney disease)   . Clostridium difficile diarrhea   . Depression   . Diplopia   . Fatigue   . GERD (gastroesophageal reflux disease)   . Hyperlipemia   . Hypertension   . Hypovitaminosis D   . IBS (irritable bowel syndrome)   . 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 Insomnia   . Left lumbosacral radiculopathy   . Moderate obstructive sleep apnea   . Osteomyelitis of foot ([place])   . PAD (peripheral artery disease) ([place])   . Pruritus   . RA (rheumatoid arthritis) ([place])   . Radiculopathy   . Restless legs syndrome   . RLS (restless legs syndrome)   . SLE (systemic lupus erythematosus related syndrome) ([place])   . 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 Stroke ([place])   . Toxic maculopathy of both eyes     Past Surgical History:  Procedure Laterality Date  . CARDIAC CATHETERIZATION    .  CAROTID ENDARTERECTOMY    . CERVICAL LAMINECTOMY    . CORONARY ANGIOPLASTY    . FRACTURE SURGERY    . fusion C5-6-7    . HERNIA REPAIR    . KNEE ARTHROSCOPY    . TONSILLECTOMY      There were no vitals filed for this visit.  Subjective Assessment - 05/26/19 1356    Subjective   Pt. reports left LLE pain    Patient is accompanied by:  Family member    Pertinent History  Pt. is a 74 y.o. 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 Pt. PMHx includes: Multiple Falls, Lupus, DJD, Renal Abscess, CVA. Pt. resides with his wife. Pt.'s wife and daughter assist with ADLs. Pt. has caregivers in  for 2 hours a day, 6 days a week. Pt. received Rehab services in acute care, at SNF for STR, and [place] services. Pt. is retired from [employer] for [employer] and [employer].    Patient Stated Goals  To regain use of his left UE, and do more for himself.    Currently in Pain?  No/denies    Pain Score  8     Pain Orientation  Right;Left  Pain Descriptors / Indicators  Aching    Pain Type  Acute pain    Pain Onset  1 to 4 weeks ago      OT TREATMENT    Therapeutic Activities:  Pt. worked on Financial risk analyst PCP Administrator, arts. Pt. worked on Administrator, arts patterns to construct a PCP pipe tower. Pt. required increased, and continuous verbal, and visual cues for each step when constructing the tower, and to locate the next position on the design pattern. Pt. required consistent cues to incorporate his left hand during the task.   Pt. reports that he hurt his left foot after running into his front door this past weekend. Pt. reports that he can not tolerate wearing a shoe on the foot. Pt. required extensive visual, and verbal cues, and increased time for each when completing the pipe design pattern. Pt. Continues to work on improving LUE functioning, left sides awareness, and processing during ADLs, and IADL tasks.                           OT  Education - 05/26/19 1400    Education Details  Left UE functioning, left sided awareness.    Person(s) Educated  Patient    Methods  Explanation;Demonstration    Comprehension  Returned demonstration;Tactile cues required;Verbalized understanding;Need further instruction          OT Long Term Goals - 05/01/19 1309      OT LONG TERM GOAL #1   Title  Pt. will increase left shoulder flexion AROM by 10 degrees to access cabinet/shelf.    Baseline  Shoulder flexion:125. Pt. continues to work on reaching with increasing emphasis on flexing trunk to improve functional reach.    Time  12    Period  Weeks    Status  Partially Met    Target Date  06/30/19      OT LONG TERM GOAL #2   Title  Pt. will donn a shirt with Supervision.    Baseline  Pt. continues to require MinA donning a zip down jacket with consistent cues to initiate the correct direction of the jacket.    Time  12    Period  Weeks    Status  On-going    Target Date  06/30/19      OT LONG TERM GOAL #4   Title  Pt. will improve left grip strength by 10# to be able to open a jar/container.    Baseline  Pt. continues to progress with grip strength, and continues to work towards opening containers, and bottles.    Time  12    Period  Weeks    Status  On-going    Target Date  06/30/19      OT LONG TERM GOAL #5   Title  Pt. will improve left hand [facility] skills to be able to assist with buttoning/zipping.    Baseline  Pt. is improving with manipulating the zipper on his jacket, however continues to require practice. Pt. continues to work on improving bilateral [facility] skills for buttoning/zipping    Time  12    Period  Weeks    Status  On-going    Target Date  06/30/19      OT LONG TERM GOAL #6   Title  Pt. will demonstrate visual conmpensatory strategies for 100% of the time during ADLs    Baseline  Pt. requires cues for left sides awareness.    Time  12  Period  Weeks    Status  On-going    Target Date  06/30/19      OT  LONG TERM GOAL #8   Title  Pt. will accurately identify potential safety hazard using good safety awareness, and judgement 100% for ADLs, and IADLs.    Baseline  Pt. requires cues    Time  12    Period  Weeks    Status  On-going    Target Date  06/30/19      OT LONG TERM GOAL  #9   TITLE  Pt. will  navigate his w/c around obstacles with Supervision and 100% accuracy    Baseline  Pt. continues to require cuing for obstacle on the left, however requires less cues.    Time  12    Period  Weeks    Status  On-going    Target Date  06/30/19      OT LONG TERM GOAL  #11   TITLE  Pt. will increase BUE strength by 26m grades in preparation for ADL transfers.    Baseline  Pt. continues to present with limited BUE strength    Time  12    Period  Weeks    Status  On-going    Target Date  06/30/19            Plan - 05/26/19 1400    Clinical Impression Statement Pt. reports that he hurt his left foot after running into his front door this past weekend. Pt. reports that he can not tolerate wearing a shoe on the foot. Pt. required extensive visual, and verbal cues, and increased time for each when completing the pipe design pattern. Pt. Continues to work on improving LUE functioning, left sides awareness, and processing during ADLs, and IADL tasks.   OT Occupational Profile and History  Problem Focused Assessment - Including review of records relating to presenting problem    Occupational performance deficits (Please refer to evaluation for details):  ADL's    Body Structure / Function / Physical Skills  UE functional use;Coordination;FMC;Dexterity;Strength;ROM    Cognitive Skills  Attention;Memory;Emotional;Problem Solve;Safety Awareness    Rehab Potential  Good    Clinical Decision Making  Several treatment options, min-mod task modification necessary    Comorbidities Affecting Occupational Performance:  Presence of comorbidities impacting occupational performance    Comorbidities impacting  occupational performance description:  Phyical, cognitive, visual,  medical comorbidities    Modification or Assistance to Complete Evaluation   No modification of tasks or assist necessary to complete eval    OT Frequency  2x / week    OT Duration  12 weeks    OT Treatment/Interventions  Self-care/ADL training;DME and/or AE instruction;Therapeutic exercise;Therapeutic activities;Moist Heat;Cognitive remediation/compensation;Neuromuscular education;Visual/perceptual remediation/compensation;Coping strategies training;Patient/family education;Passive range of motion;Psychosocial skills training;Energy conservation;Functional Mobility Training    Recommended Other Services  PT    Consulted and Agree with Plan of Care  Patient    Family Member Consulted  Daughter JMarcie Bal      Patient will benefit from skilled therapeutic intervention in order to improve the following deficits and impairments:   Body Structure / Function / Physical Skills: UE functional use, Coordination, FMC, Dexterity, Strength, ROM Cognitive Skills: Attention, Memory, Emotional, Problem Solve, Safety Awareness     Visit Diagnosis: Muscle weakness (generalized)  Other lack of coordination    Problem List Patient Active Problem List   Diagnosis Date Noted  . Sepsis ([place] 10/18/2018    EHarrel Carina MS, OTR/L 05/26/2019, 25:32  PM  [place] MAIN [facility] SERVICES [address] [place], [place], [address] Phone: [phone]   Fax:  [phone]  Name: Kenneth Summers MRN: [id number] Date of Birth: [birth year]

## 2019-05-28 ENCOUNTER — Encounter: Payer: Self-pay | Admitting: Physical Therapy

## 2019-05-28 ENCOUNTER — Ambulatory Visit: Payer: Medicare Other

## 2019-05-28 ENCOUNTER — Other Ambulatory Visit: Payer: Self-pay

## 2019-05-28 DIAGNOSIS — R296 Repeated falls: Secondary | ICD-10-CM

## 2019-05-28 DIAGNOSIS — R262 Difficulty in walking, not elsewhere classified: Secondary | ICD-10-CM

## 2019-05-28 DIAGNOSIS — I69354 Hemiplegia and hemiparesis following cerebral infarction affecting left non-dominant side: Secondary | ICD-10-CM

## 2019-05-28 DIAGNOSIS — M6281 Muscle weakness (generalized): Secondary | ICD-10-CM

## 2019-05-28 DIAGNOSIS — R278 Other lack of coordination: Secondary | ICD-10-CM

## 2019-05-28 NOTE — Therapy (Signed)
[place] MAIN [facility] SERVICES [address] [place], [place], [address] Phone: [phone]   Fax:  [phone]  Physical Therapy Treatment  Patient Details  Name: Kenneth Summers MRN: [id number] Date of Birth: 16-Aug-1945 Referring Provider (PT): Dr. [name], Guy Begin   Encounter Date: 05/28/2019  PT End of Session - 05/28/19 1255    Visit Number  [id number]    Number of Visits  180    Date for PT Re-Evaluation  08/20/19    Authorization Time Period  goals last assessed 05/26/19    PT Start Time  1300    PT Stop Time  1346    PT Time Calculation (min)  46 min    Equipment Utilized During Treatment  Gait belt    Activity Tolerance  Patient tolerated treatment well;No increased pain    Behavior During Therapy  WFL for tasks assessed/performed       Past Medical History:  Diagnosis Date  . Actinic keratosis   . Alcohol abuse   . APS (antiphospholipid syndrome) ([place])   . Basal cell carcinoma   . BPH (benign prostatic hyperplasia)   . CAD (coronary artery disease)   . Cellulitis   . Cervical spinal stenosis   . Chronic osteomyelitis ([place])   . CKD (chronic kidney disease)   . CKD (chronic kidney disease)   . Clostridium difficile diarrhea   . Depression   . Diplopia   . Fatigue   . GERD (gastroesophageal reflux disease)   . Hyperlipemia   . Hypertension   . Hypovitaminosis D   . IBS (irritable bowel syndrome)   . 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 Insomnia   . Left lumbosacral radiculopathy   . Moderate obstructive sleep apnea   . Osteomyelitis of foot ([place])   . PAD (peripheral artery disease) ([place])   . Pruritus   . RA (rheumatoid arthritis) ([place])   . Radiculopathy   . Restless legs syndrome   . RLS (restless legs syndrome)   . SLE (systemic lupus erythematosus related syndrome) ([place])   . 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 Stroke ([place])   . Toxic maculopathy of both eyes     Past Surgical  History:  Procedure Laterality Date  . CARDIAC CATHETERIZATION    . CAROTID ENDARTERECTOMY    . CERVICAL LAMINECTOMY    . CORONARY ANGIOPLASTY    . FRACTURE SURGERY    . fusion C5-6-7    . HERNIA REPAIR    . KNEE ARTHROSCOPY    . TONSILLECTOMY      There were no vitals filed for this visit.  Subjective Assessment - 05/28/19 1254    Subjective  Patient reported he is doing okay today. Does endorse L UE pain, including numbness towards end of session.    Patient is accompained by:  Family member    Pertinent History  Patient is a 74 y.o. male who presents to outpatient physical therapy with a referral for medical diagnosis of CVA. This patient's chief complaints consist of left hemiplegia and overall deconditioning leading to the following functional deficits: dependent for ADLs, IADLs, unable to transfer to car, dependent transfers at home with hoyer lift, unable to walk or stand without significant assistance, difficulty with W/C navigation..    Limitations  Sitting;Lifting;Standing;House hold activities;Writing;Walking;Other (comment)    Diagnostic tests  Brain MRI 11/01/2017: IMPRESSION: 1. Acute right ACA territory infarct as demonstrated on prior CT imaging. No  intracranial hemorrhage or significant mass effect. 2. 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    Patient Stated Goals  wants to be able to walk again    Currently in Pain?  No/denies       There were no vitals filed for this visit.  TREATMENT: Patient presents to therapy with wound on great toe of left foot, wrapped;  Seated anterior weight shifts in preparation for slide board transfers x7 rounds, tactile cueing throughout.  PT instructed patient in sliding board transfer, required max A for board placement and max VCs for weight shift and positioning. He required mod A +2 for slide board transfer wheelchair to mat table; Upon sitting on mat table, re-educated patient in proper  sitting posture including anterior pelvic tilt, erect posture, weight shift to neutral; When sitting unsupported, patient often slumps and leans to left side. With time and supported rest breaks, pt exhibited improved sitting balance and midline positioning. Seated reaching to R with CGA also performed x7 in preparation for slide board transfer  Patient then transferred from mat table back to wheelchair with mod A +2 with cues for hand placement, weight shift and trunk positioning;   Seated L hamstring stretch x15mn, 2 rounds Seated L calf stretch x1 min Seated LAQ x10 on R AROM, AAROM on LLE x10, tactile and verbal cueing throughout Seated hip abduction/adduction of LLE: minimal AAROM for adduction, PROM for hip abduction, trace muscle activation Seated hip abduction/adduction of RLE: AROM x10   Pt response/clinical impression: The patient reported some fatigue with exercises, especially with seated balance. Multimodal cueing throughout to promote proper muscle activation and postural awareness, variable from occasional CGA to maintain position to modA with fatigue. The patient would benefit from further skilled PT intervention to continue to address functional limitations to maximize mobility.      PT Education - 05/28/19 1254    Education Details  transfers, positioning, exercises    Person(s) Educated  Patient;Child(ren)    Methods  Explanation;Verbal cues;Tactile cues    Comprehension  Verbalized understanding;Returned demonstration;Verbal cues required;Tactile cues required;Need further instruction       PT Short Term Goals - 05/28/19 0717      PT SHORT TERM GOAL #1   Title  Be independent with initial home exercise program for self-management of symptoms.    Baseline  HEP to be given at second session; advice to practice unsupported sitting at home (06/19/2018); 07/08/18: Performing at home, 6/29: needs assistance but is doing exercise program regularly; 08/22/18: adherent;    Time   2    Period  Weeks    Status  Achieved    Target Date  06/26/18        PT Long Term Goals - 05/28/19 0717      PT LONG TERM GOAL #1   Title  Patient will complete all bed mobility with min A to improve functional independence for getting in and out of bed and adjusting in bed.     Baseline  max A - total A reported by family (06/19/2018); 07/08/18: still requires maxA, dtr reports improvement in rolling since starting therapy; 6/23: min A to supervision in clinic; not doing at home right; 8/5: Pt's daughter states that his rolling has improved, but still has difficulty with all other bed mobility; 10/23/18: pts daughter states that she is doing about 35% of the work if he is in proper position, he does uses the bed rails to help roll;  using Harrel Lemon for coming to sit EOB; 10/12: pts daughter states that she is doing about 30% of the work if he is in proper position, he does uses the bed rails to help roll, but it is improving;  using Hoyer for coming to sit EOB, 10/29: min A for sit to sidelying, rolling supervision, able to transition sidelying to sit with HHA;    Time  12    Period  Weeks    Status  Achieved    Target Date  12/04/18      PT LONG TERM GOAL #2   Title  Patient will complete sit <> stand transfer chair to chair with Min A and LRAD to improve functional independence for household and community mobility.     Baseline  supervision for STS, still requires elevated surface height to 27"    Time  12    Period  Weeks    Status  Achieved    Target Date  05/23/19      PT LONG TERM GOAL #3   Title  Patient will navigate power w/c with min A x 100 feet to improve mobility for household and short community distances.    Baseline  required total A (06/12/2018); able to roll 20 feet with min A (06/20/2018); 07/08/18: Pt can go approximately 10' without assist per family;  10/23/2018: pt's daughter states that his power WC mobility has become 100% better with the new hand control, slowed speed, and  with practice; 10/12: pt's daughter reports that he can perform household WC mobility and limited community ambulation with minA    Time  12    Period  Weeks    Status  Achieved    Target Date  05/23/19      PT LONG TERM GOAL #4   Title  Patient will complete car transfers W/C to family SUV with min A using LRAD to improve his abilty to participate in community activities.     Baseline  unable to complete car transfers at all (06/12/2018); unable to complete car transfers at all (06/19/2018).; 07/08/18: Unable to perform at this time; 09/11/18: unable to perform at this time; 10/23/2018: unable to complete car transfers at all; 10/12:  unable to complete car transfers at all, 10/29: unable; 01/13/19: unable; 2/11: min-mod A +2, 3/10: min A+1    Time  12    Period  Weeks    Status  Achieved    Target Date  05/23/19      PT LONG TERM GOAL #5   Title  Patient will ambulate at least 10 feet with min A using LRAD to improve mobility for household and community distances.    Baseline  unable to take steps (06/12/2018); able to shift R leg forward and back with max A and RW at edge of plinth (06/19/2018); 07/08/18: unable to ambulate at this time. 09/11/18: unable to perform at this time; 10/23/2018: unable to perform at this time; 10/12: unable to perform at this time, 10/29: unable at this time; 01/13/19: unable at this time, 2/11: min A to step with RLE, unable to step with LLE, 3/10: able to take 2 steps in parallel bars with min A for safety; 4/1: min A for walking 10 feet in parallel bars    Time  12    Period  Weeks    Status  Partially Met    Target Date  08/20/19      PT LONG TERM GOAL #6   Title  Patient  will be able to safety navigate up/down ramp with power chair, exhibiting good safety awareness, mod I to safely enter/exit his home.    Baseline  2/11: supervision with intermittent min A;4/1: supervision;    Time  12    Period  Weeks    Status  Partially Met    Target Date  08/20/19      PT LONG  TERM GOAL #7   Title  Patient will increase functional reach test to >15 inches in sitting to exhibit improved sitting balance and positioning and reduce fall risk;    Baseline  07/30/18: 12 inches; 09/11/18: 10-12 inches; 10/23/18: 12-13 inch; 10/12: 14.5", 10/29: 15.5 inch    Time  4    Period  Weeks    Status  Achieved    Target Date  05/23/19      PT LONG TERM GOAL #8   Title  Patient will increase LLE quad strength to 3+/5 to improve functional strength for standing and mobility;    Baseline  10/28: 2+/5; 01/13/19: 2+/5, 2/11: 3-/5, 04/16/19: 3-/5, 05/08/19: 3-/5    Time  12    Period  Weeks    Status  Partially Met    Target Date  08/20/19            Plan - 05/28/19 1255    Clinical Impression Statement  The patient reported some fatigue with exercises, especially with seated balance. Multimodal cueing throughout to promote proper muscle activation and postural awareness, variable from occasional CGA to maintain position to modA with fatigue. The patient would benefit from further skilled PT intervention to continue to address functional limitations to maximize mobility.    Personal Factors and Comorbidities  Age;Comorbidity 3+    Comorbidities  Relevant past medical history and comorbidities include long term steroid use for lupus, CKD, chronic osteomyelitis, cervical spine stenosis, BPH, APS, alcohol abuse, peripheral artery disease, depression, GERD, obstructive sleep apnea, HTN, IBS, IgA deficiency, lumbar radiculopathy, RA, Stroke, toxic maculopathy in both eyes, systemic lupus erythematosus related syndrome, cardiac catheterization, carotid endarterectomy, cervical laminectomy, coronary angioplasty, Fusion C5-C7, knee arthroplasty, lumbar surgery.    Examination-Activity Limitations  Bed Mobility;Bend;Sit;Toileting;Stand;Stairs;Lift;Transfers;Squat;Locomotion Level;Carry;Dressing;Hygiene/Grooming;Continence    Examination-Participation Restrictions  Yard Work;Interpersonal  Relationship;Community Activity    Stability/Clinical Decision Making  Evolving/Moderate complexity    Rehab Potential  Fair    PT Frequency  3x / week    PT Duration  12 weeks    PT Treatment/Interventions  ADLs/Self Care Home Management;Electrical Stimulation;Moist Heat;Cryotherapy;Gait training;Stair training;Functional mobility training;Therapeutic exercise;Balance training;Neuromuscular re-education;Cognitive remediation;Patient/family education;Orthotic Fit/Training;Wheelchair mobility training;Manual techniques;Passive range of motion;Energy conservation;Joint Manipulations    PT Next Visit Plan  Work on strength, sitting/standing balance, functional activities such as rolling, bed mobility, transfers; caregiver training    PT Home Exercise Plan  no updates this session    Consulted and Agree with Plan of Care  Patient;Family member/caregiver    Family Member Consulted  daughter [name]       Patient will benefit from skilled therapeutic intervention in order to improve the following deficits and impairments:  Abnormal gait, Decreased activity tolerance, Decreased cognition, Decreased endurance, Decreased knowledge of use of DME, Decreased range of motion, Decreased skin integrity, Decreased strength, Impaired perceived functional ability, Impaired sensation, Impaired UE functional use, Improper body mechanics, Pain, Cardiopulmonary status limiting activity, Decreased balance, Decreased coordination, Decreased mobility, Difficulty walking, Impaired tone, Postural dysfunction  Visit Diagnosis: Other lack of coordination  Difficulty in walking, not elsewhere classified  Muscle weakness (generalized)  Repeated  falls  Hemiplegia and hemiparesis following cerebral infarction affecting left non-dominant side [facility])     Problem List Patient Active Problem List   Diagnosis Date Noted  . Sepsis ([place]) 10/18/2018    Lieutenant Diego PT, DPT 2:06 PM,05/28/19   [place] MAIN [facility] SERVICES [address] [place], [place], [address] Phone: [phone]   Fax:  [phone]  Name: Kenneth Summers MRN: [id number] Date of Birth: 1945-02-18

## 2019-05-28 NOTE — Therapy (Signed)
[place] MAIN [facility] SERVICES [address] [place], [place], [address] Phone: [phone]   Fax:  [phone]  Physical Therapy Treatment  Patient Details  Name: Kenneth Summers MRN: [id number] Date of Birth: [birth year] Referring Provider (PT): Dr. [name], Guy Begin   Encounter Date: 05/26/2019  PT End of Session - 05/28/19 0636    Visit Number  [id number]    Number of Visits  180    Date for PT Re-Evaluation  08/20/19    Authorization Time Period  goals last assessed 05/26/19    PT Start Time  1302    PT Stop Time  1345    PT Time Calculation (min)  43 min    Equipment Utilized During Treatment  Gait belt    Activity Tolerance  Patient tolerated treatment well;No increased pain    Behavior During Therapy  WFL for tasks assessed/performed       Past Medical History:  Diagnosis Date  . Actinic keratosis   . Alcohol abuse   . APS (antiphospholipid syndrome) ([place])   . Basal cell carcinoma   . BPH (benign prostatic hyperplasia)   . CAD (coronary artery disease)   . Cellulitis   . Cervical spinal stenosis   . Chronic osteomyelitis ([place])   . CKD (chronic kidney disease)   . CKD (chronic kidney disease)   . Clostridium difficile diarrhea   . Depression   . Diplopia   . Fatigue   . GERD (gastroesophageal reflux disease)   . Hyperlipemia   . Hypertension   . Hypovitaminosis D   . IBS (irritable bowel syndrome)   . 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 Insomnia   . Left lumbosacral radiculopathy   . Moderate obstructive sleep apnea   . Osteomyelitis of foot ([place])   . PAD (peripheral artery disease) ([place])   . Pruritus   . RA (rheumatoid arthritis) ([place])   . Radiculopathy   . Restless legs syndrome   . RLS (restless legs syndrome)   . SLE (systemic lupus erythematosus related syndrome) ([place])   . 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 Stroke ([place])   . Toxic maculopathy of both eyes     Past Surgical  History:  Procedure Laterality Date  . CARDIAC CATHETERIZATION    . CAROTID ENDARTERECTOMY    . CERVICAL LAMINECTOMY    . CORONARY ANGIOPLASTY    . FRACTURE SURGERY    . fusion C5-6-7    . HERNIA REPAIR    . KNEE ARTHROSCOPY    . TONSILLECTOMY      There were no vitals filed for this visit.    TREATMENT: Patient presents to therapy with wound on great toe of left foot;  PT inspected, active bleeding noted along distal tip of great toe. PT recommended patient avoid weight bearing activities to allow toe to heal. Pt denies any pain due to numbness in left foot. Educated patient and caregiver on importance of daily foot care and inspection due to numbness and concern of re-injury or infection;  Educated patient/caregiver in proper dressing to allow foot to heal. Pt/caregiver verbalized understanding;  PT instructed patient in sliding board transfer, required max A for board placement and max VCs for weight shift and positioning. He required mod A +1 for slide board transfer wheelchair to mat table; Upon sitting on mat table, re-educated patient in proper sitting posture including anterior pelvic tilt, erect posture, weight shift to neutral; When  sitting unsupported, patient often slumps and leans to left side. He was able to self correct with verbal cues but unable to hold for >1 min due to fatigue;  Patient then transferred from mat table back to wheelchair with mod A +1 with cues for hand placement, weight shift and trunk positioning;   Response to treatment: Tolerated well. He denies any pain. Does report mild fatigue with sliding board transfers. He continues to require cues for proper trunk control and posture with slide board transfers and sitting unsupported.                         PT Education - 05/28/19 0636    Education Details  transfers, positioning, proper foot care;    Person(s) Educated  Patient;Child(ren)    Methods  Explanation;Verbal cues     Comprehension  Verbalized understanding;Returned demonstration;Verbal cues required;Tactile cues required;Need further instruction       PT Short Term Goals - 05/28/19 0717      PT SHORT TERM GOAL #1   Title  Be independent with initial home exercise program for self-management of symptoms.    Baseline  HEP to be given at second session; advice to practice unsupported sitting at home (06/19/2018); 07/08/18: Performing at home, 6/29: needs assistance but is doing exercise program regularly; 08/22/18: adherent;    Time  2    Period  Weeks    Status  Achieved    Target Date  06/26/18        PT Long Term Goals - 05/28/19 0717      PT LONG TERM GOAL #1   Title  Patient will complete all bed mobility with min A to improve functional independence for getting in and out of bed and adjusting in bed.     Baseline  max A - total A reported by family (06/19/2018); 07/08/18: still requires maxA, dtr reports improvement in rolling since starting therapy; 6/23: min A to supervision in clinic; not doing at home right; 8/5: Pt's daughter states that his rolling has improved, but still has difficulty with all other bed mobility; 10/23/18: pts daughter states that she is doing about 35% of the work if he is in proper position, he does uses the bed rails to help roll;  using [place] for coming to sit EOB; 10/12: pts daughter states that she is doing about 30% of the work if he is in proper position, he does uses the bed rails to help roll, but it is improving;  using [place]oyer for coming to sit EOB, 10/29: min A for sit to sidelying, rolling supervision, able to transition sidelying to sit with HHA;    Time  12    Period  Weeks    Status  Achieved    Target Date  12/04/18      PT LONG TERM GOAL #2   Title  Patient will complete sit <> stand transfer chair to chair with Min A and LRAD to improve functional independence for household and community mobility.     Baseline  supervision for STS, still requires elevated surface  height to 27"    Time  12    Period  Weeks    Status  Achieved    Target Date  05/23/19      PT LONG TERM GOAL #3   Title  Patient will navigate power w/c with min A x 100 feet to improve mobility for household and short community distances.  Baseline  required total A (06/12/2018); able to roll 20 feet with min A (06/20/2018); 07/08/18: Pt can go approximately 10' without assist per family;  10/23/2018: pt's daughter states that his power WC mobility has become 100% better with the new hand control, slowed speed, and with practice; 10/12: pt's daughter reports that he can perform household WC mobility and limited community ambulation with minA    Time  12    Period  Weeks    Status  Achieved    Target Date  05/23/19      PT LONG TERM GOAL #4   Title  Patient will complete car transfers W/C to family SUV with min A using LRAD to improve his abilty to participate in community activities.     Baseline  unable to complete car transfers at all (06/12/2018); unable to complete car transfers at all (06/19/2018).; 07/08/18: Unable to perform at this time; 09/11/18: unable to perform at this time; 10/23/2018: unable to complete car transfers at all; 10/12:  unable to complete car transfers at all, 10/29: unable; 01/13/19: unable; 2/11: min-mod A +2, 3/10: min A+1    Time  12    Period  Weeks    Status  Achieved    Target Date  05/23/19      PT LONG TERM GOAL #5   Title  Patient will ambulate at least 10 feet with min A using LRAD to improve mobility for household and community distances.    Baseline  unable to take steps (06/12/2018); able to shift R leg forward and back with max A and RW at edge of plinth (06/19/2018); 07/08/18: unable to ambulate at this time. 09/11/18: unable to perform at this time; 10/23/2018: unable to perform at this time; 10/12: unable to perform at this time, 10/29: unable at this time; 01/13/19: unable at this time, 2/11: min A to step with RLE, unable to step with LLE, 3/10: able to take 2  steps in parallel bars with min A for safety; 4/1: min A for walking 10 feet in parallel bars    Time  12    Period  Weeks    Status  Partially Met    Target Date  08/20/19      PT LONG TERM GOAL #6   Title  Patient will be able to safety navigate up/down ramp with power chair, exhibiting good safety awareness, mod I to safely enter/exit his home.    Baseline  2/11: supervision with intermittent min A;4/1: supervision;    Time  12    Period  Weeks    Status  Partially Met    Target Date  08/20/19      PT LONG TERM GOAL #7   Title  Patient will increase functional reach test to >15 inches in sitting to exhibit improved sitting balance and positioning and reduce fall risk;    Baseline  07/30/18: 12 inches; 09/11/18: 10-12 inches; 10/23/18: 12-13 inch; 10/12: 14.5", 10/29: 15.5 inch    Time  4    Period  Weeks    Status  Achieved    Target Date  05/23/19      PT LONG TERM GOAL #8   Title  Patient will increase LLE quad strength to 3+/5 to improve functional strength for standing and mobility;    Baseline  10/28: 2+/5; 01/13/19: 2+/5, 2/11: 3-/5, 04/16/19: 3-/5, 05/08/19: 3-/5    Time  12    Period  Weeks    Status  Partially Met  Target Date  08/20/19            Plan - 05/28/19 0640    Clinical Impression Statement  Patient motivated and participated well within session. He does present to therapy with wound on left great toe. PT inspected and educated patient and caregiver how to keep it covered. Recommend holding off weight bearing activities for a few days to allow toe to heal. Educated patient and caregiver in sliding board transfers. Plan to work on strengthening and postural re-education in sitting while foot heals. Patient has overall been progressing well with sit<>Stand transfers and has been able to take a few steps in parallel bars. He continues to require close supervision to min A and cues for erect posture and weight shift to neutral. He would benefit from additional  skilled PT intervention to improve strength, balance and mobility;    Comorbidities  Relevant past medical history and comorbidities include long term steroid use for lupus, CKD, chronic osteomyelitis, cervical spine stenosis, BPH, APS, alcohol abuse, peripheral artery disease, depression, GERD, obstructive sleep apnea, HTN, IBS, IgA deficiency, lumbar radiculopathy, RA, Stroke, toxic maculopathy in both eyes, systemic lupus erythematosus related syndrome, cardiac catheterization, carotid endarterectomy, cervical laminectomy, coronary angioplasty, Fusion C5-C7, knee arthroplasty, lumbar surgery.    Rehab Potential  Fair    PT Frequency  3x / week    PT Duration  12 weeks    PT Treatment/Interventions  ADLs/Self Care Home Management;Electrical Stimulation;Moist Heat;Cryotherapy;Gait training;Stair training;Functional mobility training;Therapeutic exercise;Balance training;Neuromuscular re-education;Cognitive remediation;Patient/family education;Orthotic Fit/Training;Wheelchair mobility training;Manual techniques;Passive range of motion;Energy conservation;Joint Manipulations    PT Next Visit Plan  Work on strength, sitting/standing balance, functional activities such as rolling, bed mobility, transfers; caregiver training    PT Home Exercise Plan  no updates this session    Consulted and Agree with Plan of Care  Patient;Family member/caregiver    Family Member Consulted  daughter [name]       Patient will benefit from skilled therapeutic intervention in order to improve the following deficits and impairments:  Abnormal gait, Decreased activity tolerance, Decreased cognition, Decreased endurance, Decreased knowledge of use of DME, Decreased range of motion, Decreased skin integrity, Decreased strength, Impaired perceived functional ability, Impaired sensation, Impaired UE functional use, Improper body mechanics, Pain, Cardiopulmonary status limiting activity, Decreased balance, Decreased coordination,  Decreased mobility, Difficulty walking, Impaired tone, Postural dysfunction  Visit Diagnosis: Muscle weakness (generalized)  Other lack of coordination  Difficulty in walking, not elsewhere classified  Repeated falls     Problem List Patient Active Problem List   Diagnosis Date Noted  . Sepsis ([place]) 10/18/2018    Britni Driscoll PT, DPT 05/28/2019, 7:19 AM  [place] MAIN [facility] SERVICES [address] [place], [place], [address] Phone: [phone]   Fax:  [phone]  Name: JOSHAUA EPPLE MRN: [id number] Date of Birth: [birth year]

## 2019-05-29 ENCOUNTER — Ambulatory Visit: Payer: Medicare Other | Admitting: Occupational Therapy

## 2019-05-29 ENCOUNTER — Other Ambulatory Visit: Payer: Self-pay

## 2019-05-29 ENCOUNTER — Encounter: Payer: Self-pay | Admitting: Physical Therapy

## 2019-05-29 ENCOUNTER — Encounter: Payer: Self-pay | Admitting: Occupational Therapy

## 2019-05-29 ENCOUNTER — Ambulatory Visit: Payer: Medicare Other | Admitting: Physical Therapy

## 2019-05-29 DIAGNOSIS — M6281 Muscle weakness (generalized): Secondary | ICD-10-CM

## 2019-05-29 DIAGNOSIS — R278 Other lack of coordination: Secondary | ICD-10-CM

## 2019-05-29 DIAGNOSIS — R296 Repeated falls: Secondary | ICD-10-CM

## 2019-05-29 DIAGNOSIS — R262 Difficulty in walking, not elsewhere classified: Secondary | ICD-10-CM

## 2019-05-29 NOTE — Therapy (Signed)
[place] MAIN [facility] SERVICES [address] [place], [place], [address] Phone: [phone]   Fax:  [phone]  Occupational Therapy Treatment  Patient Details  Name: Kenneth Summers MRN: [id number] Date of Birth: [birth year] No data recorded  Encounter Date: 05/29/2019  OT End of Session - 05/29/19 1312    Visit Number  106    Number of Visits  157    Date for OT Re-Evaluation  06/30/19    Authorization Type  Progress report period starting 12/26/2018    OT Start Time  1300    OT Stop Time  1345    OT Time Calculation (min)  45 min    Activity Tolerance  Patient tolerated treatment well;Patient limited by fatigue    Behavior During Therapy  [facility] for tasks assessed/performed       Past Medical History:  Diagnosis Date  . Actinic keratosis   . Alcohol abuse   . APS (antiphospholipid syndrome) ([place])   . Basal cell carcinoma   . BPH (benign prostatic hyperplasia)   . CAD (coronary artery disease)   . Cellulitis   . Cervical spinal stenosis   . Chronic osteomyelitis ([place])   . CKD (chronic kidney disease)   . CKD (chronic kidney disease)   . Clostridium difficile diarrhea   . Depression   . Diplopia   . Fatigue   . GERD (gastroesophageal reflux disease)   . Hyperlipemia   . Hypertension   . Hypovitaminosis D   . IBS (irritable bowel syndrome)   . 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 Insomnia   . Left lumbosacral radiculopathy   . Moderate obstructive sleep apnea   . Osteomyelitis of foot ([place])   . PAD (peripheral artery disease) ([place])   . Pruritus   . RA (rheumatoid arthritis) ([place])   . Radiculopathy   . Restless legs syndrome   . RLS (restless legs syndrome)   . SLE (systemic lupus erythematosus related syndrome) ([place])   . 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 Stroke ([place])   . Toxic maculopathy of both eyes     Past Surgical History:  Procedure Laterality Date  . CARDIAC CATHETERIZATION    .  CAROTID ENDARTERECTOMY    . CERVICAL LAMINECTOMY    . CORONARY ANGIOPLASTY    . FRACTURE SURGERY    . fusion C5-6-7    . HERNIA REPAIR    . KNEE ARTHROSCOPY    . TONSILLECTOMY      There were no vitals filed for this visit.  Subjective Assessment - 05/29/19 1310    Subjective   Pt. reports no pain    Patient is accompanied by:  Family member    Pertinent History  Pt. is a 74 y.o. 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 Pt. PMHx includes: Multiple Falls, Lupus, DJD, Renal Abscess, CVA. Pt. resides with his wife. Pt.'s wife and daughter assist with ADLs. Pt. has caregivers in  for 2 hours a day, 6 days a week. Pt. received Rehab services in acute care, at SNF for STR, and [place] services. Pt. is retired from [employer] for [employer] and [employer].    Currently in Pain?  No/denies      OT  TREATMENT    Therapeutic Activities:  Pt. worked onusing his left hand forgrasping,and placing 2"large pegs on the [employer] placed at a tabletop surface. Pt. Worked on  following [employer] patterns. Pt. required cues to follow the design pattern, and place the pegs in the accurate spot on the board. Pt. worked on further challenging the task with increasing trunk control, and flexing forward at the trunk when placing the pegs onto the board.   Pt.'s pain is improved today. Pt. Reports no pain. No rest breaks were required today. Pt. required increased cues initially to engage the left UE. Pt. Required consistent cues for left sided awareness, trunk control, and for acuuracy with following more complex design patterns. Pt. continues to work on these skills in order to work towards increasing engagement of the LUE during ADLs, and IADL tasks.                        OT Education - 05/29/19 1311    Education Details  Left UE functioning, left sided awareness.    Person(s) Educated  Patient    Methods   Explanation;Demonstration    Comprehension  Returned demonstration;Tactile cues required;Verbalized understanding;Need further instruction          OT Long Term Goals - 05/01/19 1309      OT LONG TERM GOAL #1   Title  Pt. will increase left shoulder flexion AROM by 10 degrees to access cabinet/shelf.    Baseline  Shoulder flexion:125. Pt. continues to work on reaching with increasing emphasis on flexing trunk to improve functional reach.    Time  12    Period  Weeks    Status  Partially Met    Target Date  06/30/19      OT LONG TERM GOAL #2   Title  Pt. will donn a shirt with Supervision.    Baseline  Pt. continues to require MinA donning a zip down jacket with consistent cues to initiate the correct direction of the jacket.    Time  12    Period  Weeks    Status  On-going    Target Date  06/30/19      OT LONG TERM GOAL #4   Title  Pt. will improve left grip strength by 10# to be able to open a jar/container.    Baseline  Pt. continues to progress with grip strength, and continues to work towards opening containers, and bottles.    Time  12    Period  Weeks    Status  On-going    Target Date  06/30/19      OT LONG TERM GOAL #5   Title  Pt. will improve left hand [facility] skills to be able to assist with buttoning/zipping.    Baseline  Pt. is improving with manipulating the zipper on his jacket, however continues to require practice. Pt. continues to work on improving bilateral [facility] skills for buttoning/zipping    Time  12    Period  Weeks    Status  On-going    Target Date  06/30/19      OT LONG TERM GOAL #6   Title  Pt. will demonstrate visual conmpensatory strategies for 100% of the time during ADLs    Baseline  Pt. requires cues for left sides awareness.    Time  12    Period  Weeks    Status  On-going    Target Date  06/30/19      OT LONG TERM GOAL #8   Title  Pt. will accurately identify potential safety hazard using good safety awareness, and judgement 100% for  ADLs,  and IADLs.    Baseline  Pt. requires cues    Time  12    Period  Weeks    Status  On-going    Target Date  06/30/19      OT LONG TERM GOAL  #9   TITLE  Pt. will  navigate his w/c around obstacles with Supervision and 100% accuracy    Baseline  Pt. continues to require cuing for obstacle on the left, however requires less cues.    Time  12    Period  Weeks    Status  On-going    Target Date  06/30/19      OT LONG TERM GOAL  #11   TITLE  Pt. will increase BUE strength by 71m grades in preparation for ADL transfers.    Baseline  Pt. continues to present with limited BUE strength    Time  12    Period  Weeks    Status  On-going    Target Date  06/30/19            Plan - 05/29/19 1312    Clinical Impression Statement Pt.'s pain is improved today. Pt. Reports no pain. No rest breaks were required today. Pt. required increased cues initially to engage the left UE. Pt. Required consistent cues for left sided awareness, trunk control, and for acuuracy with following more complex design patterns. Pt. continues to work on these skills in order to work towards increasing engagement of the LUE during ADLs, and IADL tasks.   OT Occupational Profile and History  Problem Focused Assessment - Including review of records relating to presenting problem    Occupational performance deficits (Please refer to evaluation for details):  ADL's    Body Structure / Function / Physical Skills  UE functional use;Coordination;FMC;Dexterity;Strength;ROM    Cognitive Skills  Attention;Memory;Emotional;Problem Solve;Safety Awareness    Rehab Potential  Good    Clinical Decision Making  Several treatment options, min-mod task modification necessary    Comorbidities Affecting Occupational Performance:  Presence of comorbidities impacting occupational performance    Comorbidities impacting occupational performance description:  Phyical, cognitive, visual,  medical comorbidities    Modification or  Assistance to Complete Evaluation   No modification of tasks or assist necessary to complete eval    OT Frequency  2x / week    OT Duration  12 weeks    OT Treatment/Interventions  Self-care/ADL training;DME and/or AE instruction;Therapeutic exercise;Therapeutic activities;Moist Heat;Cognitive remediation/compensation;Neuromuscular education;Visual/perceptual remediation/compensation;Coping strategies training;Patient/family education;Passive range of motion;Psychosocial skills training;Energy conservation;Functional Mobility Training    Consulted and Agree with Plan of Care  Patient       Patient will benefit from skilled therapeutic intervention in order to improve the following deficits and impairments:   Body Structure / Function / Physical Skills: UE functional use, Coordination, FMC, Dexterity, Strength, ROM Cognitive Skills: Attention, Memory, Emotional, Problem Solve, Safety Awareness     Visit Diagnosis: Muscle weakness (generalized)  Other lack of coordination    Problem List Patient Active Problem List   Diagnosis Date Noted  . Sepsis ([place] 10/18/2018    EHarrel Carina MS, OTR/L 05/29/2019, 1:17 PM  [place] [facility] [address] [address]Phone: [phone]  Fax:  [phone] Name: JJACKEY HOUSEYMRN: [id number] of Birth: [birth year]

## 2019-05-29 NOTE — Addendum Note (Signed)
Addended by: Blanche East E on: 05/29/2019 09:07 AM   Modules accepted: Orders

## 2019-05-29 NOTE — Therapy (Signed)
[facility] [address] [place], [place], [address] Phone: [phone]   Fax:  [phone]  Physical Therapy Treatment  Patient Details  Name: Kenneth Summers MRN: [id number] Date of Birth: 11/09/1945 Referring Provider (PT): Dr. Olmedo, Mario Ernesto   Encounter Date: 05/29/2019  PT End of Session - 05/29/19 1402    Visit Number  [id number]    Number of Visits  180    Date for PT Re-Evaluation  08/20/19    Authorization Time Period  goals last assessed 05/26/19    PT Start Time  1355    PT Stop Time  1430    PT Time Calculation (min)  35 min    Equipment Utilized During Treatment  Gait belt    Activity Tolerance  Patient tolerated treatment well;No increased pain    Behavior During Therapy  WFL for tasks assessed/performed       Past Medical History:  Diagnosis Date  . Actinic keratosis   . Alcohol abuse   . APS (antiphospholipid syndrome) (HCC)   . Basal cell carcinoma   . BPH (benign prostatic hyperplasia)   . CAD (coronary artery disease)   . Cellulitis   . Cervical spinal stenosis   . Chronic osteomyelitis (HCC)   . CKD (chronic kidney disease)   . CKD (chronic kidney disease)   . Clostridium difficile diarrhea   . Depression   . Diplopia   . Fatigue   . GERD (gastroesophageal reflux disease)   . Hyperlipemia   . Hypertension   . Hypovitaminosis D   . IBS (irritable bowel syndrome)   . IgA deficiency (HCC)   . Insomnia   . Left lumbosacral radiculopathy   . Moderate obstructive sleep apnea   . Osteomyelitis of foot (HCC)   . PAD (peripheral artery disease) (HCC)   . Pruritus   . RA (rheumatoid arthritis) (HCC)   . Radiculopathy   . Restless legs syndrome   . RLS (restless legs syndrome)   . SLE (systemic lupus erythematosus related syndrome) (HCC)   . 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 Stroke (HCC)   . Toxic maculopathy of both eyes     Past Surgical  History:  Procedure Laterality Date  . CARDIAC CATHETERIZATION    . CAROTID ENDARTERECTOMY    . CERVICAL LAMINECTOMY    . CORONARY ANGIOPLASTY    . FRACTURE SURGERY    . fusion C5-6-7    . HERNIA REPAIR    . KNEE ARTHROSCOPY    . TONSILLECTOMY      There were no vitals filed for this visit.  Subjective Assessment - 05/29/19 1401    Subjective  Patient reports doing well. He reports increased stiffness in LLE today. Likely due to cold weather.    Patient is accompained by:  Family member    Pertinent History  Patient is a 74 y.o. male who presents to outpatient physical therapy with a referral for medical diagnosis of CVA. This patient's chief complaints consist of left hemiplegia and overall deconditioning leading to the following functional deficits: dependent for ADLs, IADLs, unable to transfer to car, dependent transfers at home with hoyer lift, unable to walk or stand without significant assistance, difficulty with W/C navigation..    Limitations  Sitting;Lifting;Standing;House hold activities;Writing;Walking;Other (comment)    Diagnostic tests  Brain MRI 11/01/2017: IMPRESSION: 1. Acute right ACA territory infarct as demonstrated on prior CT imaging. No intracranial hemorrhage   or significant mass effect. 2. 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    Patient Stated Goals  wants to be able to walk again    Currently in Pain?  No/denies    Multiple Pain Sites  No          TREATMENT: Patient performed sliding board transfer wheelchair to mat table with mod A +1; Required mod VCs for hand placement/trunk control. Initially patient exhibits heavy posterior lean. Reoriented patient to midline with better postural control which allowed improved transfer ability;   Exercise:  Sitting on mat table: Anterior/posterior pelvic rocks x10 reps; Reorient sitting posture to midline with anterior pelvic rock;  BUE low row red tband 2x15, required mod VCs  and tactile cues to increase scapular retraction, especially on LUE;  Alternate UE lift x10 reps with mod VCs for anterior pelvic tilt for improved sitting posture; Patient unable to achieve full AROM on either side due to weakness and stiffness in UE;  Reaching in various directions x2 min each UE to challenge seated balance; Required close supervision and mod VCs for reorient to midline and to improve erect posture for better trunk control;   Patient required min A to transition to hooklying:    Hooklying: Bridges 2x10 reps Posterior/anterior pelvic rocks x10 reps; Patient required min-moderate verbal/tactile cues for correct exercise technique.  Required mod A+1 to transition to sitting edge of mat table;  Patient instructed in slide board transfer back to wheelchair with mod A +2 with mod VCs for proper hand placement and positioning;  Response to treatment: Patient tolerated well. He does report increased fatigue at end of session. He was unable to tolerate sitting for >20 min due to fatigue and discomfort in spine. Patient able to progress seated balance with sitting unsupported and reaching outside base of support. He would benefit from additional skilled PT intervention to improve trunk control and postural re-education.                     PT Education - 05/29/19 1402    Education Details  transfers/positioning, exercise;    Person(s) Educated  Patient;Child(ren)    Methods  Explanation;Verbal cues    Comprehension  Verbalized understanding;Returned demonstration;Verbal cues required;Need further instruction       PT Short Term Goals - 05/28/19 0717      PT SHORT TERM GOAL #1   Title  Be independent with initial home exercise program for self-management of symptoms.    Baseline  HEP to be given at second session; advice to practice unsupported sitting at home (06/19/2018); 07/08/18: Performing at home, 6/29: needs assistance but is doing exercise program regularly;  08/22/18: adherent;    Time  2    Period  Weeks    Status  Achieved    Target Date  06/26/18        PT Long Term Goals - 05/28/19 0717      PT LONG TERM GOAL #1   Title  Patient will complete all bed mobility with min A to improve functional independence for getting in and out of bed and adjusting in bed.     Baseline  max A - total A reported by family (06/19/2018); 07/08/18: still requires maxA, dtr reports improvement in rolling since starting therapy; 6/23: min A to supervision in clinic; not doing at home right; 8/5: Pt's daughter states that his rolling has improved, but still has difficulty with all other bed mobility; 10/23/18: pts  daughter states that she is doing about 35% of the work if he is in proper position, he does uses the bed rails to help roll;  using Hoyer for coming to sit EOB; 10/12: pts daughter states that she is doing about 30% of the work if he is in proper position, he does uses the bed rails to help roll, but it is improving;  using Hoyer for coming to sit EOB, 10/29: min A for sit to sidelying, rolling supervision, able to transition sidelying to sit with HHA;    Time  12    Period  Weeks    Status  Achieved    Target Date  12/04/18      PT LONG TERM GOAL #2   Title  Patient will complete sit <> stand transfer chair to chair with Min A and LRAD to improve functional independence for household and community mobility.     Baseline  supervision for STS, still requires elevated surface height to 27"    Time  12    Period  Weeks    Status  Achieved    Target Date  05/23/19      PT LONG TERM GOAL #3   Title  Patient will navigate power w/c with min A x 100 feet to improve mobility for household and short community distances.    Baseline  required total A (06/12/2018); able to roll 20 feet with min A (06/20/2018); 07/08/18: Pt can go approximately 10' without assist per family;  10/23/2018: pt's daughter states that his power WC mobility has become 100% better with the new  hand control, slowed speed, and with practice; 10/12: pt's daughter reports that he can perform household WC mobility and limited community ambulation with minA    Time  12    Period  Weeks    Status  Achieved    Target Date  05/23/19      PT LONG TERM GOAL #4   Title  Patient will complete car transfers W/C to family SUV with min A using LRAD to improve his abilty to participate in community activities.     Baseline  unable to complete car transfers at all (06/12/2018); unable to complete car transfers at all (06/19/2018).; 07/08/18: Unable to perform at this time; 09/11/18: unable to perform at this time; 10/23/2018: unable to complete car transfers at all; 10/12:  unable to complete car transfers at all, 10/29: unable; 01/13/19: unable; 2/11: min-mod A +2, 3/10: min A+1    Time  12    Period  Weeks    Status  Achieved    Target Date  05/23/19      PT LONG TERM GOAL #5   Title  Patient will ambulate at least 10 feet with min A using LRAD to improve mobility for household and community distances.    Baseline  unable to take steps (06/12/2018); able to shift R leg forward and back with max A and RW at edge of plinth (06/19/2018); 07/08/18: unable to ambulate at this time. 09/11/18: unable to perform at this time; 10/23/2018: unable to perform at this time; 10/12: unable to perform at this time, 10/29: unable at this time; 01/13/19: unable at this time, 2/11: min A to step with RLE, unable to step with LLE, 3/10: able to take 2 steps in parallel bars with min A for safety; 4/1: min A for walking 10 feet in parallel bars    Time  12    Period  Weeks      Status  Partially Met    Target Date  08/20/19      Additional Long Term Goals   Additional Long Term Goals  Yes      PT LONG TERM GOAL #6   Title  Patient will be able to safety navigate up/down ramp with power chair, exhibiting good safety awareness, mod I to safely enter/exit his home.    Baseline  2/11: supervision with intermittent min A;4/1: supervision;     Time  12    Period  Weeks    Status  Partially Met    Target Date  08/20/19      PT LONG TERM GOAL #7   Title  Patient will increase functional reach test to >15 inches in sitting to exhibit improved sitting balance and positioning and reduce fall risk;    Baseline  07/30/18: 12 inches; 09/11/18: 10-12 inches; 10/23/18: 12-13 inch; 10/12: 14.5", 10/29: 15.5 inch    Time  4    Period  Weeks    Status  Achieved    Target Date  05/23/19      PT LONG TERM GOAL #8   Title  Patient will increase LLE quad strength to 3+/5 to improve functional strength for standing and mobility;    Baseline  10/28: 2+/5; 01/13/19: 2+/5, 2/11: 3-/5, 04/16/19: 3-/5, 05/08/19: 3-/5    Time  12    Period  Weeks    Status  Partially Met    Target Date  08/20/19      PT LONG TERM GOAL  #9   TITLE  Patient will report no vertigo with provoking motions or positions in order to be able to perform ADLs and mobility tasks without symptoms.    Baseline  reports severe dizziness with room spinning causing nausea and vomitting;    Time  12    Period  Weeks    Status  New    Target Date  08/20/19            Plan - 05/29/19 2013    Clinical Impression Statement  Patient motivated and participated well within session. He was able to progress seated balance exercise this session being able to reach outside base of support with supervision. He does require cues for reorient to midline and cues for erect posture/pelvic control for better sitting posture. Instructed patient in advanced seated exercise to challenge postural strength and control. Patient does require mod A for slide board transfer with increased cues for proper hand placement and trunk positioning. He fatigues quickly being unable to tolerate unsupported sitting for >20 min. Patient would benefit from additional skilled PT interventio to improve strength, balance and gait safety;    Comorbidities  Relevant past medical history and comorbidities include long term  steroid use for lupus, CKD, chronic osteomyelitis, cervical spine stenosis, BPH, APS, alcohol abuse, peripheral artery disease, depression, GERD, obstructive sleep apnea, HTN, IBS, IgA deficiency, lumbar radiculopathy, RA, Stroke, toxic maculopathy in both eyes, systemic lupus erythematosus related syndrome, cardiac catheterization, carotid endarterectomy, cervical laminectomy, coronary angioplasty, Fusion C5-C7, knee arthroplasty, lumbar surgery.    Rehab Potential  Fair    PT Frequency  3x / week    PT Duration  12 weeks    PT Treatment/Interventions  ADLs/Self Care Home Management;Electrical Stimulation;Moist Heat;Cryotherapy;Gait training;Stair training;Functional mobility training;Therapeutic exercise;Balance training;Neuromuscular re-education;Cognitive remediation;Patient/family education;Orthotic Fit/Training;Wheelchair mobility training;Manual techniques;Passive range of motion;Energy conservation;Joint Manipulations;Canalith Repostioning;Therapeutic activities;Vestibular    PT Next Visit Plan  Work on strength, sitting/standing balance, functional activities such as  rolling, bed mobility, transfers; caregiver training    PT Home Exercise Plan  no updates this session    Consulted and Agree with Plan of Care  Patient;Family member/caregiver    Family Member Consulted  daughter Janet       Patient will benefit from skilled therapeutic intervention in order to improve the following deficits and impairments:  Abnormal gait, Decreased activity tolerance, Decreased cognition, Decreased endurance, Decreased knowledge of use of DME, Decreased range of motion, Decreased skin integrity, Decreased strength, Impaired perceived functional ability, Impaired sensation, Impaired UE functional use, Improper body mechanics, Pain, Cardiopulmonary status limiting activity, Decreased balance, Decreased coordination, Decreased mobility, Difficulty walking, Impaired tone, Postural dysfunction, Dizziness  Visit  Diagnosis: Other lack of coordination  Muscle weakness (generalized)  Difficulty in walking, not elsewhere classified  Repeated falls     Problem List Patient Active Problem List   Diagnosis Date Noted  . Sepsis (HCC) 10/18/2018    , PT, DPT 05/29/2019, 8:15 PM  [facility] [address] [place], [place], [address] Phone: [phone]   Fax:  [phone]  Name: Wally F Storrs MRN: [id number] Date of Birth: 05/06/1945   

## 2019-06-02 ENCOUNTER — Ambulatory Visit: Payer: Medicare Other | Admitting: Physical Therapy

## 2019-06-02 ENCOUNTER — Encounter: Payer: Self-pay | Admitting: Physical Therapy

## 2019-06-02 ENCOUNTER — Ambulatory Visit: Payer: Medicare Other | Admitting: Occupational Therapy

## 2019-06-02 ENCOUNTER — Encounter: Payer: Self-pay | Admitting: Occupational Therapy

## 2019-06-02 ENCOUNTER — Other Ambulatory Visit: Payer: Self-pay

## 2019-06-02 DIAGNOSIS — R296 Repeated falls: Secondary | ICD-10-CM

## 2019-06-02 DIAGNOSIS — R278 Other lack of coordination: Secondary | ICD-10-CM

## 2019-06-02 DIAGNOSIS — R262 Difficulty in walking, not elsewhere classified: Secondary | ICD-10-CM

## 2019-06-02 DIAGNOSIS — M6281 Muscle weakness (generalized): Secondary | ICD-10-CM | POA: Diagnosis not present

## 2019-06-02 NOTE — Therapy (Signed)
[place] MAIN [facility] SERVICES [address] [place], [place], [address] Phone: [phone]   Fax:  [phone]  Physical Therapy Treatment  Patient Details  Name: Kenneth Summers MRN: [id number] Date of Birth: [birth year] Referring Provider (PT): Dr. [name], Guy Begin   Encounter Date: 06/02/2019  PT End of Session - 06/02/19 1448    Visit Number  [id number]    Number of Visits  180    Date for PT Re-Evaluation  08/20/19    Authorization Time Period  goals last assessed 05/26/19    PT Start Time  1302    PT Stop Time  1345    PT Time Calculation (min)  43 min    Equipment Utilized During Treatment  Gait belt    Activity Tolerance  Patient tolerated treatment well;No increased pain    Behavior During Therapy  WFL for tasks assessed/performed       Past Medical History:  Diagnosis Date  . Actinic keratosis   . Alcohol abuse   . APS (antiphospholipid syndrome) ([place])   . Basal cell carcinoma   . BPH (benign prostatic hyperplasia)   . CAD (coronary artery disease)   . Cellulitis   . Cervical spinal stenosis   . Chronic osteomyelitis ([place])   . CKD (chronic kidney disease)   . CKD (chronic kidney disease)   . Clostridium difficile diarrhea   . Depression   . Diplopia   . Fatigue   . GERD (gastroesophageal reflux disease)   . Hyperlipemia   . Hypertension   . Hypovitaminosis D   . IBS (irritable bowel syndrome)   . 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 Insomnia   . Left lumbosacral radiculopathy   . Moderate obstructive sleep apnea   . Osteomyelitis of foot ([place])   . PAD (peripheral artery disease) ([place])   . Pruritus   . RA (rheumatoid arthritis) ([place])   . Radiculopathy   . Restless legs syndrome   . RLS (restless legs syndrome)   . SLE (systemic lupus erythematosus related syndrome) ([place])   . 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 Stroke ([place])   . Toxic maculopathy of both eyes     Past Surgical  History:  Procedure Laterality Date  . CARDIAC CATHETERIZATION    . CAROTID ENDARTERECTOMY    . CERVICAL LAMINECTOMY    . CORONARY ANGIOPLASTY    . FRACTURE SURGERY    . fusion C5-6-7    . HERNIA REPAIR    . KNEE ARTHROSCOPY    . TONSILLECTOMY      There were no vitals filed for this visit.  Subjective Assessment - 06/02/19 1447    Subjective  Patient reports doing well; Denies any pain; Reports continued wound on LLE foot.    Patient is accompained by:  Family member    Pertinent History  Patient is a 74 y.o. male who presents to outpatient physical therapy with a referral for medical diagnosis of CVA. This patient's chief complaints consist of left hemiplegia and overall deconditioning leading to the following functional deficits: dependent for ADLs, IADLs, unable to transfer to car, dependent transfers at home with hoyer lift, unable to walk or stand without significant assistance, difficulty with W/C navigation..    Limitations  Sitting;Lifting;Standing;House hold activities;Writing;Walking;Other (comment)    Diagnostic tests  Brain MRI 11/01/2017: IMPRESSION: 1. Acute right ACA territory infarct as demonstrated on prior CT imaging. No intracranial hemorrhage or significant mass  effect. 2. 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    Patient Stated Goals  wants to be able to walk again    Currently in Pain?  No/denies    Multiple Pain Sites  No            TREATMENT: Patient performed sliding board transfer wheelchair to mat table with mod A +1; Required mod VCs for hand placement/trunk control. Initially patient exhibits heavy posterior lean. Reoriented patient to midline with better postural control which allowed improved transfer ability;   Exercise:  Sitting on mat table: Anterior/posterior pelvic rocks x10 reps; Reorient sitting posture to midline with anterior pelvic rock;  Alternate UE lift x10 reps with mod VCs for anterior pelvic  tilt for improved sitting posture; Patient unable to achieve full AROM on either side due to weakness and stiffness in UE;  BUE arm lift x10 reps with cues for forward weight shift and anterior pelvic rock x10 reps;  Reaching in various directions x2 min each UE to challenge seated balance; Required close supervision and mod VCs for reorient to midline and to improve erect posture for better trunk control;   Hip flexion march x10 reps each LE with tactile and visual cues for erect posture and increased ROM LAQ x5 reps with tactile cues for anterior pelvic rock progressing to x10 reps with visual cues to increase knee extension AROM  Following exercise:  Patient instructed in slide board transfer back to wheelchair with mod A +2 with mod VCs for proper hand placement and positioning;  Response to treatment: Patient tolerated well. He does report increased fatigue at end of session. He was unable to tolerate sitting for >20 min due to fatigue and discomfort in spine. Patient able to progress seated balance with sitting unsupported and reaching outside base of support. He would benefit from additional skilled PT intervention to improve trunk control and postural re-education.                        PT Education - 06/02/19 1447    Education Details  transfers/positioning, exercise;    Person(s) Educated  Patient;Child(ren)    Methods  Explanation;Verbal cues    Comprehension  Verbalized understanding;Returned demonstration;Verbal cues required;Need further instruction       PT Short Term Goals - 05/28/19 0717      PT SHORT TERM GOAL #1   Title  Be independent with initial home exercise program for self-management of symptoms.    Baseline  HEP to be given at second session; advice to practice unsupported sitting at home (06/19/2018); 07/08/18: Performing at home, 6/29: needs assistance but is doing exercise program regularly; 08/22/18: adherent;    Time  2    Period  Weeks     Status  Achieved    Target Date  06/26/18        PT Long Term Goals - 05/28/19 0717      PT LONG TERM GOAL #1   Title  Patient will complete all bed mobility with min A to improve functional independence for getting in and out of bed and adjusting in bed.     Baseline  max A - total A reported by family (06/19/2018); 07/08/18: still requires maxA, dtr reports improvement in rolling since starting therapy; 6/23: min A to supervision in clinic; not doing at home right; 8/5: Pt's daughter states that his rolling has improved, but still has difficulty with all other bed mobility;  10/23/18: pts daughter states that she is doing about 35% of the work if he is in proper position, he does uses the bed rails to help roll;  using Harrel Lemon for coming to sit EOB; 10/12: pts daughter states that she is doing about 30% of the work if he is in proper position, he does uses the bed rails to help roll, but it is improving;  using Hoyer for coming to sit EOB, 10/29: min A for sit to sidelying, rolling supervision, able to transition sidelying to sit with HHA;    Time  12    Period  Weeks    Status  Achieved    Target Date  12/04/18      PT LONG TERM GOAL #2   Title  Patient will complete sit <> stand transfer chair to chair with Min A and LRAD to improve functional independence for household and community mobility.     Baseline  supervision for STS, still requires elevated surface height to 27"    Time  12    Period  Weeks    Status  Achieved    Target Date  05/23/19      PT LONG TERM GOAL #3   Title  Patient will navigate power w/c with min A x 100 feet to improve mobility for household and short community distances.    Baseline  required total A (06/12/2018); able to roll 20 feet with min A (06/20/2018); 07/08/18: Pt can go approximately 10' without assist per family;  10/23/2018: pt's daughter states that his power WC mobility has become 100% better with the new hand control, slowed speed, and with practice; 10/12:  pt's daughter reports that he can perform household WC mobility and limited community ambulation with minA    Time  12    Period  Weeks    Status  Achieved    Target Date  05/23/19      PT LONG TERM GOAL #4   Title  Patient will complete car transfers W/C to family SUV with min A using LRAD to improve his abilty to participate in community activities.     Baseline  unable to complete car transfers at all (06/12/2018); unable to complete car transfers at all (06/19/2018).; 07/08/18: Unable to perform at this time; 09/11/18: unable to perform at this time; 10/23/2018: unable to complete car transfers at all; 10/12:  unable to complete car transfers at all, 10/29: unable; 01/13/19: unable; 2/11: min-mod A +2, 3/10: min A+1    Time  12    Period  Weeks    Status  Achieved    Target Date  05/23/19      PT LONG TERM GOAL #5   Title  Patient will ambulate at least 10 feet with min A using LRAD to improve mobility for household and community distances.    Baseline  unable to take steps (06/12/2018); able to shift R leg forward and back with max A and RW at edge of plinth (06/19/2018); 07/08/18: unable to ambulate at this time. 09/11/18: unable to perform at this time; 10/23/2018: unable to perform at this time; 10/12: unable to perform at this time, 10/29: unable at this time; 01/13/19: unable at this time, 2/11: min A to step with RLE, unable to step with LLE, 3/10: able to take 2 steps in parallel bars with min A for safety; 4/1: min A for walking 10 feet in parallel bars    Time  12    Period  Weeks  Status  Partially Met    Target Date  08/20/19      Additional Long Term Goals   Additional Long Term Goals  Yes      PT LONG TERM GOAL #6   Title  Patient will be able to safety navigate up/down ramp with power chair, exhibiting good safety awareness, mod I to safely enter/exit his home.    Baseline  2/11: supervision with intermittent min A;4/1: supervision;    Time  12    Period  Weeks    Status  Partially  Met    Target Date  08/20/19      PT LONG TERM GOAL #7   Title  Patient will increase functional reach test to >15 inches in sitting to exhibit improved sitting balance and positioning and reduce fall risk;    Baseline  07/30/18: 12 inches; 09/11/18: 10-12 inches; 10/23/18: 12-13 inch; 10/12: 14.5", 10/29: 15.5 inch    Time  4    Period  Weeks    Status  Achieved    Target Date  05/23/19      PT LONG TERM GOAL #8   Title  Patient will increase LLE quad strength to 3+/5 to improve functional strength for standing and mobility;    Baseline  10/28: 2+/5; 01/13/19: 2+/5, 2/11: 3-/5, 04/16/19: 3-/5, 05/08/19: 3-/5    Time  12    Period  Weeks    Status  Partially Met    Target Date  08/20/19      PT LONG TERM GOAL  #9   TITLE  Patient will report no vertigo with provoking motions or positions in order to be able to perform ADLs and mobility tasks without symptoms.    Baseline  reports severe dizziness with room spinning causing nausea and vomitting;    Time  12    Period  Weeks    Status  New    Target Date  08/20/19            Plan - 06/03/19 1410    Clinical Impression Statement  Patient motivated and participated well within session. He continues to have wound on left great toe. PT focused on seated exercise to challenge trunk control, balance and strengthening. Patient was able to exhibit better sitting balance, reaching in various directions without instability. He does require cues for erect posture, specifically for increased anterior pelvic rock during UE/LE movement for better trunk control. Patient fatigues quickly with unsupported sitting. He would benefit from additional skilled PT Intervention to improve strength, balance and mobility;    Comorbidities  Relevant past medical history and comorbidities include long term steroid use for lupus, CKD, chronic osteomyelitis, cervical spine stenosis, BPH, APS, alcohol abuse, peripheral artery disease, depression, GERD, obstructive sleep  apnea, HTN, IBS, IgA deficiency, lumbar radiculopathy, RA, Stroke, toxic maculopathy in both eyes, systemic lupus erythematosus related syndrome, cardiac catheterization, carotid endarterectomy, cervical laminectomy, coronary angioplasty, Fusion C5-C7, knee arthroplasty, lumbar surgery.    Rehab Potential  Fair    PT Frequency  3x / week    PT Duration  12 weeks    PT Treatment/Interventions  ADLs/Self Care Home Management;Electrical Stimulation;Moist Heat;Cryotherapy;Gait training;Stair training;Functional mobility training;Therapeutic exercise;Balance training;Neuromuscular re-education;Cognitive remediation;Patient/family education;Orthotic Fit/Training;Wheelchair mobility training;Manual techniques;Passive range of motion;Energy conservation;Joint Manipulations;Canalith Repostioning;Therapeutic activities;Vestibular    PT Next Visit Plan  Work on strength, sitting/standing balance, functional activities such as rolling, bed mobility, transfers; caregiver training    PT Home Exercise Plan  no updates this session  Consulted and Agree with Plan of Care  Patient;Family member/caregiver    Family Member Consulted  daughter [name]       Patient will benefit from skilled therapeutic intervention in order to improve the following deficits and impairments:  Abnormal gait, Decreased activity tolerance, Decreased cognition, Decreased endurance, Decreased knowledge of use of DME, Decreased range of motion, Decreased skin integrity, Decreased strength, Impaired perceived functional ability, Impaired sensation, Impaired UE functional use, Improper body mechanics, Pain, Cardiopulmonary status limiting activity, Decreased balance, Decreased coordination, Decreased mobility, Difficulty walking, Impaired tone, Postural dysfunction, Dizziness  Visit Diagnosis: Muscle weakness (generalized)  Other lack of coordination  Difficulty in walking, not elsewhere classified  Repeated falls     Problem  List Patient Active Problem List   Diagnosis Date Noted  . Sepsis ([place]) 10/18/2018    Zhi Geier PT, DPT 06/03/2019, 2:12 PM  [place] MAIN [facility] SERVICES [address] [place], [place], [address] Phone: [phone]   Fax:  [phone]  Name: Kenneth Summers MRN: [id number] Date of Birth: 1946/01/13

## 2019-06-02 NOTE — Therapy (Signed)
[place] MAIN [facility] SERVICES [address] [place], [place], [address] Phone: [phone]   Fax:  [phone]  Occupational Therapy Treatment  Patient Details  Name: Kenneth Summers MRN: [id number] Date of Birth: 28-Dec-1945 No data recorded  Encounter Date: 06/02/2019  OT End of Session - 06/02/19 1358    Visit Number  [id number]    Number of Visits  157    Date for OT Re-Evaluation  06/30/19    Authorization Type  Progress report period starting 12/26/2018    OT Start Time  1350    OT Stop Time  1430    OT Time Calculation (min)  40 min    Activity Tolerance  Patient tolerated treatment well;Patient limited by fatigue    Behavior During Therapy  [facility] for tasks assessed/performed       Past Medical History:  Diagnosis Date  . Actinic keratosis   . Alcohol abuse   . APS (antiphospholipid syndrome) ([place])   . Basal cell carcinoma   . BPH (benign prostatic hyperplasia)   . CAD (coronary artery disease)   . Cellulitis   . Cervical spinal stenosis   . Chronic osteomyelitis ([place])   . CKD (chronic kidney disease)   . CKD (chronic kidney disease)   . Clostridium difficile diarrhea   . Depression   . Diplopia   . Fatigue   . GERD (gastroesophageal reflux disease)   . Hyperlipemia   . Hypertension   . Hypovitaminosis D   . IBS (irritable bowel syndrome)   . 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 Insomnia   . Left lumbosacral radiculopathy   . Moderate obstructive sleep apnea   . Osteomyelitis of foot ([place])   . PAD (peripheral artery disease) ([place])   . Pruritus   . RA (rheumatoid arthritis) ([place])   . Radiculopathy   . Restless legs syndrome   . RLS (restless legs syndrome)   . SLE (systemic lupus erythematosus related syndrome) ([place])   . 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 Stroke ([place])   . Toxic maculopathy of both eyes     Past Surgical History:  Procedure Laterality Date  . CARDIAC CATHETERIZATION    .  CAROTID ENDARTERECTOMY    . CERVICAL LAMINECTOMY    . CORONARY ANGIOPLASTY    . FRACTURE SURGERY    . fusion C5-6-7    . HERNIA REPAIR    . KNEE ARTHROSCOPY    . TONSILLECTOMY      There were no vitals filed for this visit.  Subjective Assessment - 06/02/19 1356    Subjective   Pt. 6/10 left shoulder pain.    Patient is accompanied by:  Family member    Pertinent History  Pt. is a 74 y.o. 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 Pt. PMHx includes: Multiple Falls, Lupus, DJD, Renal Abscess, CVA. Pt. resides with his wife. Pt.'s wife and daughter assist with ADLs. Pt. has caregivers in  for 2 hours a day, 6 days a week. Pt. received Rehab services in acute care, at SNF for STR, and [place] services. Pt. is retired from [employer] for [employer] and [employer].    Currently in Pain?  Yes    Pain Score  6     Pain Location  Shoulder    Pain Orientation  Left    Pain Descriptors / Indicators  Aching  OT  TREATMENT    Therapeutic Activities:  Pt. worked onusing his left hand forgrasping,and placing 2"large pegs on the [employer] placed at a tabletop surface. Pt. worked on following [employer] Pt. required cues to follow the design pattern, and place the pegs in the accurate spot on the board. Pt. worked on further challenging the task with increasing trunk control, and flexing forward at the trunk when placing the pegs onto the board.  Increased time was required. Pt. was able to initiate corrections.  Pt. reports that his physician called this morning, and determined that he is having seizures. Pt. is going to be starting medication to help control them soon. Pt. required less cues initially to engage the left UE, however required cues to engage his left hand, and flex forward with the trunk. Pt. required consistent cues for left sided awareness, trunk control, and for acuuracy with following more complex design  patterns. Pt. continues to work on these skills in order to work towards increasing engagement of the LUE during ADLs, and IADL tasks.                      OT Education - 06/02/19 1358    Education Details  Left UE functioning, left sided awareness.    Person(s) Educated  Patient    Methods  Explanation;Demonstration    Comprehension  Returned demonstration;Tactile cues required;Verbalized understanding;Need further instruction          OT Long Term Goals - 05/01/19 1309      OT LONG TERM GOAL #1   Title  Pt. will increase left shoulder flexion AROM by 10 degrees to access cabinet/shelf.    Baseline  Shoulder flexion:125. Pt. continues to work on reaching with increasing emphasis on flexing trunk to improve functional reach.    Time  12    Period  Weeks    Status  Partially Met    Target Date  06/30/19      OT LONG TERM GOAL #2   Title  Pt. will donn a shirt with Supervision.    Baseline  Pt. continues to require MinA donning a zip down jacket with consistent cues to initiate the correct direction of the jacket.    Time  12    Period  Weeks    Status  On-going    Target Date  06/30/19      OT LONG TERM GOAL #4   Title  Pt. will improve left grip strength by 10# to be able to open a jar/container.    Baseline  Pt. continues to progress with grip strength, and continues to work towards opening containers, and bottles.    Time  12    Period  Weeks    Status  On-going    Target Date  06/30/19      OT LONG TERM GOAL #5   Title  Pt. will improve left hand [facility] skills to be able to assist with buttoning/zipping.    Baseline  Pt. is improving with manipulating the zipper on his jacket, however continues to require practice. Pt. continues to work on improving bilateral [facility] skills for buttoning/zipping    Time  12    Period  Weeks    Status  On-going    Target Date  06/30/19      OT LONG TERM GOAL #6   Title  Pt. will demonstrate visual conmpensatory  strategies for 100% of the time during ADLs  Baseline  Pt. requires cues for left sides awareness.    Time  12    Period  Weeks    Status  On-going    Target Date  06/30/19      OT LONG TERM GOAL #8   Title  Pt. will accurately identify potential safety hazard using good safety awareness, and judgement 100% for ADLs, and IADLs.    Baseline  Pt. requires cues    Time  12    Period  Weeks    Status  On-going    Target Date  06/30/19      OT LONG TERM GOAL  #9   TITLE  Pt. will  navigate his w/c around obstacles with Supervision and 100% accuracy    Baseline  Pt. continues to require cuing for obstacle on the left, however requires less cues.    Time  12    Period  Weeks    Status  On-going    Target Date  06/30/19      OT LONG TERM GOAL  #11   TITLE  Pt. will increase BUE strength by 21m grades in preparation for ADL transfers.    Baseline  Pt. continues to present with limited BUE strength    Time  12    Period  Weeks    Status  On-going    Target Date  06/30/19            Plan - 06/02/19 1359    Clinical Impression Statement Pt. reports that his physician called this morning, and determined that he is having seizures. Pt. is going to be starting medication to help control them soon. Pt. required less cues initially to engage the left UE, however required cues to engage his left hand, and flex forward with the trunk. Pt. required consistent cues for left sided awareness, trunk control, and for acuuracy with following more complex design patterns. Pt. continues to work on these skills in order to work towards increasing engagement of the LUE during ADLs, and IADL tasks.    OT Occupational Profile and History  Problem Focused Assessment - Including review of records relating to presenting problem    Occupational performance deficits (Please refer to evaluation for details):  ADL's    Body Structure / Function / Physical Skills  UE functional  use;Coordination;FMC;Dexterity;Strength;ROM    Cognitive Skills  Attention;Memory;Emotional;Problem Solve;Safety Awareness    Rehab Potential  Good    Clinical Decision Making  Several treatment options, min-mod task modification necessary    Comorbidities Affecting Occupational Performance:  Presence of comorbidities impacting occupational performance    Comorbidities impacting occupational performance description:  Phyical, cognitive, visual,  medical comorbidities    Modification or Assistance to Complete Evaluation   No modification of tasks or assist necessary to complete eval    OT Frequency  2x / week    OT Duration  12 weeks    OT Treatment/Interventions  Self-care/ADL training;DME and/or AE instruction;Therapeutic exercise;Therapeutic activities;Moist Heat;Cognitive remediation/compensation;Neuromuscular education;Visual/perceptual remediation/compensation;Coping strategies training;Patient/family education;Passive range of motion;Psychosocial skills training;Energy conservation;Functional Mobility Training    Consulted and Agree with Plan of Care  Patient    Family Member Consulted  Daughter JMarcie Bal      Patient will benefit from skilled therapeutic intervention in order to improve the following deficits and impairments:   Body Structure / Function / Physical Skills: UE functional use, Coordination, FMC, Dexterity, Strength, ROM Cognitive Skills: Attention, Memory, Emotional, Problem Solve, Safety Awareness     Visit Diagnosis:  Muscle weakness (generalized)  Other lack of coordination    Problem List Patient Active Problem List   Diagnosis Date Noted  . Sepsis ([place]) 10/18/2018    Harrel Carina, MS, OTR/L 06/02/2019, 2:05 PM  [place] MAIN [facility] SERVICES [address] [place], [place], [address] Phone: [phone]   Fax:  [phone]  Name: Kenneth Summers MRN: [id number] Date of Birth: Jan 16, 1946

## 2019-06-04 ENCOUNTER — Encounter: Payer: Self-pay | Admitting: Physical Therapy

## 2019-06-04 ENCOUNTER — Ambulatory Visit: Payer: Medicare Other | Admitting: Physical Therapy

## 2019-06-04 ENCOUNTER — Other Ambulatory Visit: Payer: Self-pay

## 2019-06-04 DIAGNOSIS — R262 Difficulty in walking, not elsewhere classified: Secondary | ICD-10-CM

## 2019-06-04 DIAGNOSIS — M6281 Muscle weakness (generalized): Secondary | ICD-10-CM

## 2019-06-04 DIAGNOSIS — R296 Repeated falls: Secondary | ICD-10-CM

## 2019-06-04 DIAGNOSIS — R278 Other lack of coordination: Secondary | ICD-10-CM

## 2019-06-04 NOTE — Therapy (Signed)
[place] MAIN [facility] SERVICES [address] [place], [place], [address] Phone: [phone]   Fax:  [phone]  Physical Therapy Treatment  Patient Details  Name: Kenneth Summers MRN: [id number] Date of Birth: [birth year] Referring Provider (PT): Dr. [name], Guy Begin   Encounter Date: 06/04/2019  PT End of Session - 06/04/19 1350    Visit Number  [id number]    Number of Visits  180    Date for PT Re-Evaluation  08/20/19    Authorization Time Period  goals last assessed 05/26/19    PT Start Time  1302    PT Stop Time  1345    PT Time Calculation (min)  43 min    Equipment Utilized During Treatment  Gait belt    Activity Tolerance  Patient tolerated treatment well;No increased pain    Behavior During Therapy  WFL for tasks assessed/performed       Past Medical History:  Diagnosis Date  . Actinic keratosis   . Alcohol abuse   . APS (antiphospholipid syndrome) ([place])   . Basal cell carcinoma   . BPH (benign prostatic hyperplasia)   . CAD (coronary artery disease)   . Cellulitis   . Cervical spinal stenosis   . Chronic osteomyelitis ([place])   . CKD (chronic kidney disease)   . CKD (chronic kidney disease)   . Clostridium difficile diarrhea   . Depression   . Diplopia   . Fatigue   . GERD (gastroesophageal reflux disease)   . Hyperlipemia   . Hypertension   . Hypovitaminosis D   . IBS (irritable bowel syndrome)   . 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 Insomnia   . Left lumbosacral radiculopathy   . Moderate obstructive sleep apnea   . Osteomyelitis of foot ([place])   . PAD (peripheral artery disease) ([place])   . Pruritus   . RA (rheumatoid arthritis) ([place])   . Radiculopathy   . Restless legs syndrome   . RLS (restless legs syndrome)   . SLE (systemic lupus erythematosus related syndrome) ([place])   . 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 Stroke ([place])   . Toxic maculopathy of both eyes     Past Surgical  History:  Procedure Laterality Date  . CARDIAC CATHETERIZATION    . CAROTID ENDARTERECTOMY    . CERVICAL LAMINECTOMY    . CORONARY ANGIOPLASTY    . FRACTURE SURGERY    . fusion C5-6-7    . HERNIA REPAIR    . KNEE ARTHROSCOPY    . TONSILLECTOMY      There were no vitals filed for this visit.  Subjective Assessment - 06/04/19 1348    Subjective  Patient reports doing well; Denies any current pain. States last night he had increased back pain which he thinks is because he was outside bending over reaching for things.    Patient is accompained by:  Family member    Pertinent History  Patient is a 74 y.o. male who presents to outpatient physical therapy with a referral for medical diagnosis of CVA. This patient's chief complaints consist of left hemiplegia and overall deconditioning leading to the following functional deficits: dependent for ADLs, IADLs, unable to transfer to car, dependent transfers at home with hoyer lift, unable to walk or stand without significant assistance, difficulty with W/C navigation..    Limitations  Sitting;Lifting;Standing;House hold activities;Writing;Walking;Other (comment)    Diagnostic tests  Brain MRI 11/01/2017: IMPRESSION: 1. Acute  right ACA territory infarct as demonstrated on prior CT imaging. No intracranial hemorrhage or significant mass effect. 2. 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    Patient Stated Goals  wants to be able to walk again    Currently in Pain?  No/denies    Multiple Pain Sites  No               TREATMENT: Prior to transfersand in between transfers: Patient sittingin wheelchair: Instructed patient in LE strengthening exercise -hip flexion marchx15reps bilaterally Ptrequires verbal and visual cues to improve ROM;He was able to exhibit better trunk control but does have difficulty increasing LLE AROM this session;  Following exercise: Transferred sit<>Stand in parallel bars  (seat adjusted to 27 inch from floor height) x5reps throughout session;He was able to transfermin A-CGAwithminVCs for forward weight shift for better trunk control and transfer ability;  Standing in parallel bars: Standing,CGA to close supervision with min VCs for weight shift and increased extension for better stance control; -Mini squat with B rail assist 2x10reps with cues to increase knee extension in BLE between repetitions; -weight shift to neutral, with cues for hip extension  -weight shift side/side x10 reps with cues for forward weight shift for increased weight bearing/acceptance in each foot.    Patient initially exhibits flexed posture with decreased hip extension; He was able to exhibit improved hip extension during subsequent transfers; Although he continues to require min A for safety and trunk control;   Patient required minA to scoot back in chair at end of session due to increased fatigue  Response to treatment: Toleratedfair. Patient heavily fatigued this session requiring frequent seated rest breaks. During mid session patient did have to be excused to use the restroom. He denies any increase in pain with advanced exercise. Reinforced HEP;                    PT Education - 06/04/19 1350    Education Details  transfers, positioning,    Person(s) Educated  Patient;Spouse    Methods  Explanation;Verbal cues    Comprehension  Verbalized understanding;Returned demonstration;Verbal cues required;Need further instruction       PT Short Term Goals - 05/28/19 0717      PT SHORT TERM GOAL #1   Title  Be independent with initial home exercise program for self-management of symptoms.    Baseline  HEP to be given at second session; advice to practice unsupported sitting at home (06/19/2018); 07/08/18: Performing at home, 6/29: needs assistance but is doing exercise program regularly; 08/22/18: adherent;    Time  2    Period  Weeks    Status   Achieved    Target Date  06/26/18        PT Long Term Goals - 05/28/19 0717      PT LONG TERM GOAL #1   Title  Patient will complete all bed mobility with min A to improve functional independence for getting in and out of bed and adjusting in bed.     Baseline  max A - total A reported by family (06/19/2018); 07/08/18: still requires maxA, dtr reports improvement in rolling since starting therapy; 6/23: min A to supervision in clinic; not doing at home right; 8/5: Pt's daughter states that his rolling has improved, but still has difficulty with all other bed mobility; 10/23/18: pts daughter states that she is doing about 35% of the work if he is in proper position, he does uses  the bed rails to help roll;  using Harrel Lemon for coming to sit EOB; 10/12: pts daughter states that she is doing about 30% of the work if he is in proper position, he does uses the bed rails to help roll, but it is improving;  using Hoyer for coming to sit EOB, 10/29: min A for sit to sidelying, rolling supervision, able to transition sidelying to sit with HHA;    Time  12    Period  Weeks    Status  Achieved    Target Date  12/04/18      PT LONG TERM GOAL #2   Title  Patient will complete sit <> stand transfer chair to chair with Min A and LRAD to improve functional independence for household and community mobility.     Baseline  supervision for STS, still requires elevated surface height to 27"    Time  12    Period  Weeks    Status  Achieved    Target Date  05/23/19      PT LONG TERM GOAL #3   Title  Patient will navigate power w/c with min A x 100 feet to improve mobility for household and short community distances.    Baseline  required total A (06/12/2018); able to roll 20 feet with min A (06/20/2018); 07/08/18: Pt can go approximately 10' without assist per family;  10/23/2018: pt's daughter states that his power WC mobility has become 100% better with the new hand control, slowed speed, and with practice; 10/12: pt's  daughter reports that he can perform household WC mobility and limited community ambulation with minA    Time  12    Period  Weeks    Status  Achieved    Target Date  05/23/19      PT LONG TERM GOAL #4   Title  Patient will complete car transfers W/C to family SUV with min A using LRAD to improve his abilty to participate in community activities.     Baseline  unable to complete car transfers at all (06/12/2018); unable to complete car transfers at all (06/19/2018).; 07/08/18: Unable to perform at this time; 09/11/18: unable to perform at this time; 10/23/2018: unable to complete car transfers at all; 10/12:  unable to complete car transfers at all, 10/29: unable; 01/13/19: unable; 2/11: min-mod A +2, 3/10: min A+1    Time  12    Period  Weeks    Status  Achieved    Target Date  05/23/19      PT LONG TERM GOAL #5   Title  Patient will ambulate at least 10 feet with min A using LRAD to improve mobility for household and community distances.    Baseline  unable to take steps (06/12/2018); able to shift R leg forward and back with max A and RW at edge of plinth (06/19/2018); 07/08/18: unable to ambulate at this time. 09/11/18: unable to perform at this time; 10/23/2018: unable to perform at this time; 10/12: unable to perform at this time, 10/29: unable at this time; 01/13/19: unable at this time, 2/11: min A to step with RLE, unable to step with LLE, 3/10: able to take 2 steps in parallel bars with min A for safety; 4/1: min A for walking 10 feet in parallel bars    Time  12    Period  Weeks    Status  Partially Met    Target Date  08/20/19      Additional Long Term  Goals   Additional Long Term Goals  Yes      PT LONG TERM GOAL #6   Title  Patient will be able to safety navigate up/down ramp with power chair, exhibiting good safety awareness, mod I to safely enter/exit his home.    Baseline  2/11: supervision with intermittent min A;4/1: supervision;    Time  12    Period  Weeks    Status  Partially Met     Target Date  08/20/19      PT LONG TERM GOAL #7   Title  Patient will increase functional reach test to >15 inches in sitting to exhibit improved sitting balance and positioning and reduce fall risk;    Baseline  07/30/18: 12 inches; 09/11/18: 10-12 inches; 10/23/18: 12-13 inch; 10/12: 14.5", 10/29: 15.5 inch    Time  4    Period  Weeks    Status  Achieved    Target Date  05/23/19      PT LONG TERM GOAL #8   Title  Patient will increase LLE quad strength to 3+/5 to improve functional strength for standing and mobility;    Baseline  10/28: 2+/5; 01/13/19: 2+/5, 2/11: 3-/5, 04/16/19: 3-/5, 05/08/19: 3-/5    Time  12    Period  Weeks    Status  Partially Met    Target Date  08/20/19      PT LONG TERM GOAL  #9   TITLE  Patient will report no vertigo with provoking motions or positions in order to be able to perform ADLs and mobility tasks without symptoms.    Baseline  reports severe dizziness with room spinning causing nausea and vomitting;    Time  12    Period  Weeks    Status  New    Target Date  08/20/19            Plan - 06/04/19 1437    Clinical Impression Statement  Patient motivated and participated well within session. He presents to therapy with shoes on his foot and denies any pain. Patient reports that his toe is improving. He was instructed in sit<>stand transfers in parallel bars. Patient was heavily fatigued requiring min A to CGA and cues for weight shift for better transfer ability. He fatigues quickly with short bouts of standing. Patient recently had medication adjusted to include Keppra and states that he has felt more fatigued since starting medication. Instructed patient in advanced LE strengthening, requiring tactile/visual cues for optimal muscle activation. He would benefit from additional skilled PT intervention to improve strength and mobility;    Comorbidities  Relevant past medical history and comorbidities include long term steroid use for lupus, CKD, chronic  osteomyelitis, cervical spine stenosis, BPH, APS, alcohol abuse, peripheral artery disease, depression, GERD, obstructive sleep apnea, HTN, IBS, IgA deficiency, lumbar radiculopathy, RA, Stroke, toxic maculopathy in both eyes, systemic lupus erythematosus related syndrome, cardiac catheterization, carotid endarterectomy, cervical laminectomy, coronary angioplasty, Fusion C5-C7, knee arthroplasty, lumbar surgery.    Rehab Potential  Fair    PT Frequency  3x / week    PT Duration  12 weeks    PT Treatment/Interventions  ADLs/Self Care Home Management;Electrical Stimulation;Moist Heat;Cryotherapy;Gait training;Stair training;Functional mobility training;Therapeutic exercise;Balance training;Neuromuscular re-education;Cognitive remediation;Patient/family education;Orthotic Fit/Training;Wheelchair mobility training;Manual techniques;Passive range of motion;Energy conservation;Joint Manipulations;Canalith Repostioning;Therapeutic activities;Vestibular    PT Next Visit Plan  Work on strength, sitting/standing balance, functional activities such as rolling, bed mobility, transfers; caregiver training    PT Home Exercise Plan  no  updates this session    Consulted and Agree with Plan of Care  Patient;Family member/caregiver    Family Member Consulted  daughter [name]       Patient will benefit from skilled therapeutic intervention in order to improve the following deficits and impairments:  Abnormal gait, Decreased activity tolerance, Decreased cognition, Decreased endurance, Decreased knowledge of use of DME, Decreased range of motion, Decreased skin integrity, Decreased strength, Impaired perceived functional ability, Impaired sensation, Impaired UE functional use, Improper body mechanics, Pain, Cardiopulmonary status limiting activity, Decreased balance, Decreased coordination, Decreased mobility, Difficulty walking, Impaired tone, Postural dysfunction, Dizziness  Visit Diagnosis: Muscle weakness  (generalized)  Other lack of coordination  Difficulty in walking, not elsewhere classified  Repeated falls     Problem List Patient Active Problem List   Diagnosis Date Noted  . Sepsis ([place]) 10/18/2018    Trotter,Margaret PT, DPT 06/04/2019, 2:40 PM  [place] MAIN [facility] SERVICES [address] [place], [place], [address] Phone: [phone]   Fax:  [phone]  Name: Kenneth Summers MRN: [id number] Date of Birth: 10/02/1945

## 2019-06-05 ENCOUNTER — Other Ambulatory Visit: Payer: Self-pay

## 2019-06-05 ENCOUNTER — Encounter: Payer: Self-pay | Admitting: Occupational Therapy

## 2019-06-05 ENCOUNTER — Ambulatory Visit: Payer: Medicare Other | Admitting: Occupational Therapy

## 2019-06-05 ENCOUNTER — Ambulatory Visit: Payer: Medicare Other | Admitting: Physical Therapy

## 2019-06-05 DIAGNOSIS — M6281 Muscle weakness (generalized): Secondary | ICD-10-CM | POA: Diagnosis not present

## 2019-06-05 DIAGNOSIS — R296 Repeated falls: Secondary | ICD-10-CM

## 2019-06-05 DIAGNOSIS — R262 Difficulty in walking, not elsewhere classified: Secondary | ICD-10-CM

## 2019-06-05 DIAGNOSIS — R278 Other lack of coordination: Secondary | ICD-10-CM

## 2019-06-05 NOTE — Therapy (Signed)
[place] MAIN [facility] SERVICES [address] [place], [place], [address] Phone: [phone]   Fax:  [phone]  Occupational Therapy Treatment  Patient Details  Name: Kenneth Summers MRN: [id number] Date of Birth: 1945-11-21 No data recorded  Encounter Date: 06/05/2019  OT End of Session - 06/05/19 1316    Visit Number  [id number]    Number of Visits  157    Date for OT Re-Evaluation  06/30/19    Authorization Type  Progress report period starting 12/26/2018    OT Start Time  1307    OT Stop Time  1345    OT Time Calculation (min)  38 min    Activity Tolerance  Patient tolerated treatment well;Patient limited by fatigue    Behavior During Therapy  [facility] for tasks assessed/performed       Past Medical History:  Diagnosis Date  . Actinic keratosis   . Alcohol abuse   . APS (antiphospholipid syndrome) ([place])   . Basal cell carcinoma   . BPH (benign prostatic hyperplasia)   . CAD (coronary artery disease)   . Cellulitis   . Cervical spinal stenosis   . Chronic osteomyelitis ([place])   . CKD (chronic kidney disease)   . CKD (chronic kidney disease)   . Clostridium difficile diarrhea   . Depression   . Diplopia   . Fatigue   . GERD (gastroesophageal reflux disease)   . Hyperlipemia   . Hypertension   . Hypovitaminosis D   . IBS (irritable bowel syndrome)   . 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 Insomnia   . Left lumbosacral radiculopathy   . Moderate obstructive sleep apnea   . Osteomyelitis of foot ([place])   . PAD (peripheral artery disease) ([place])   . Pruritus   . RA (rheumatoid arthritis) ([place])   . Radiculopathy   . Restless legs syndrome   . RLS (restless legs syndrome)   . SLE (systemic lupus erythematosus related syndrome) ([place])   . 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 Stroke ([place])   . Toxic maculopathy of both eyes     Past Surgical History:  Procedure Laterality Date  . CARDIAC CATHETERIZATION    .  CAROTID ENDARTERECTOMY    . CERVICAL LAMINECTOMY    . CORONARY ANGIOPLASTY    . FRACTURE SURGERY    . fusion C5-6-7    . HERNIA REPAIR    . KNEE ARTHROSCOPY    . TONSILLECTOMY      There were no vitals filed for this visit.  Subjective Assessment - 06/05/19 1314    Subjective   Pt. reports no pain today.    Patient is accompanied by:  Family member    Pertinent History  Pt. is a 74 y.o. 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 Pt. PMHx includes: Multiple Falls, Lupus, DJD, Renal Abscess, CVA. Pt. resides with his wife. Pt.'s wife and daughter assist with ADLs. Pt. has caregivers in  for 2 hours a day, 6 days a week. Pt. received Rehab services in acute care, at SNF for STR, and [place] services. Pt. is retired from [employer] for [employer] and [employer].    Currently in Pain?  No/denies       OTTREATMENT   Therapeutic Activities:  Pt. worked onusing his left hand forgrasping,and placing 2"large pegs on the [employer] placed at a tabletop surface. Pt. worked on following  [employer] patterns. Pt. required cues to follow the design pattern, and place the pegs in the accurate spot on the board. Pt.presented with decreased tasks initiation.  Pt. started a  medication to help control the seizures.  pt. Required increased cuing today, and presented with decreased task initiation, requiring increased cues to use his left hand, as well as cues for accuracy followingmore complexdesign patterns. Pt. continues to work on these skills in order to work towards increasing engagement of the LUE during ADLs, and IADL tasks. Pt. Reported that his left arm felt heavier than normal, and reported numbness in his left fingers. Pt.'s daughter was notified, who notified his physician of the changes. BP 112/52.  Pt. Continues to work on improving LUE functioning in order to work improving overall independence with ADLs, and  IADLs.                     OT Education - 06/05/19 1316    Education Details  Left UE functioning, left sided awareness.    Person(s) Educated  Patient    Methods  Explanation;Demonstration    Comprehension  Returned demonstration;Tactile cues required;Verbalized understanding;Need further instruction          OT Long Term Goals - 05/01/19 1309      OT LONG TERM GOAL #1   Title  Pt. will increase left shoulder flexion AROM by 10 degrees to access cabinet/shelf.    Baseline  Shoulder flexion:125. Pt. continues to work on reaching with increasing emphasis on flexing trunk to improve functional reach.    Time  12    Period  Weeks    Status  Partially Met    Target Date  06/30/19      OT LONG TERM GOAL #2   Title  Pt. will donn a shirt with Supervision.    Baseline  Pt. continues to require MinA donning a zip down jacket with consistent cues to initiate the correct direction of the jacket.    Time  12    Period  Weeks    Status  On-going    Target Date  06/30/19      OT LONG TERM GOAL #4   Title  Pt. will improve left grip strength by 10# to be able to open a jar/container.    Baseline  Pt. continues to progress with grip strength, and continues to work towards opening containers, and bottles.    Time  12    Period  Weeks    Status  On-going    Target Date  06/30/19      OT LONG TERM GOAL #5   Title  Pt. will improve left hand [facility] skills to be able to assist with buttoning/zipping.    Baseline  Pt. is improving with manipulating the zipper on his jacket, however continues to require practice. Pt. continues to work on improving bilateral [facility] skills for buttoning/zipping    Time  12    Period  Weeks    Status  On-going    Target Date  06/30/19      OT LONG TERM GOAL #6   Title  Pt. will demonstrate visual conmpensatory strategies for 100% of the time during ADLs    Baseline  Pt. requires cues for left sides awareness.    Time  12    Period  Weeks     Status  On-going    Target Date  06/30/19      OT LONG TERM GOAL #  8   Title  Pt. will accurately identify potential safety hazard using good safety awareness, and judgement 100% for ADLs, and IADLs.    Baseline  Pt. requires cues    Time  12    Period  Weeks    Status  On-going    Target Date  06/30/19      OT LONG TERM GOAL  #9   TITLE  Pt. will  navigate his w/c around obstacles with Supervision and 100% accuracy    Baseline  Pt. continues to require cuing for obstacle on the left, however requires less cues.    Time  12    Period  Weeks    Status  On-going    Target Date  06/30/19      OT LONG TERM GOAL  #11   TITLE  Pt. will increase BUE strength by 36m grades in preparation for ADL transfers.    Baseline  Pt. continues to present with limited BUE strength    Time  12    Period  Weeks    Status  On-going    Target Date  06/30/19            Plan - 06/05/19 1319    Clinical Impression Statement  Pt. started a  medication to help control the seizures.  pt. Required increased cuing today, and presented with decreased task initiation, requiring increased cues to use his left hand, as well as cues for accuracy followingmore complexdesign patterns. Pt. continues to work on these skills in order to work towards increasing engagement of the LUE during ADLs, and IADL tasks. Pt. Reported that his left arm felt heavier than normal, and reported numbness in his left fingers. Pt.'s daughter was notified, who notified his physician of the changes. BP 112/52.  Pt. Continues to work on improving LUE functioning in order to work improving overall independence with ADLs, and IADLs.   OT Occupational Profile and History  Problem Focused Assessment - Including review of records relating to presenting problem    Occupational performance deficits (Please refer to evaluation for details):  ADL's    Body Structure / Function / Physical Skills  UE functional  use;Coordination;FMC;Dexterity;Strength;ROM    Cognitive Skills  Attention;Memory;Emotional;Problem Solve;Safety Awareness    Rehab Potential  Good    Clinical Decision Making  Several treatment options, min-mod task modification necessary    Comorbidities Affecting Occupational Performance:  Presence of comorbidities impacting occupational performance    Comorbidities impacting occupational performance description:  Phyical, cognitive, visual,  medical comorbidities    Modification or Assistance to Complete Evaluation   No modification of tasks or assist necessary to complete eval    OT Frequency  2x / week    OT Duration  12 weeks    OT Treatment/Interventions  Self-care/ADL training;DME and/or AE instruction;Therapeutic exercise;Therapeutic activities;Moist Heat;Cognitive remediation/compensation;Neuromuscular education;Visual/perceptual remediation/compensation;Coping strategies training;Patient/family education;Passive range of motion;Psychosocial skills training;Energy conservation;Functional Mobility Training    Consulted and Agree with Plan of Care  Patient       Patient will benefit from skilled therapeutic intervention in order to improve the following deficits and impairments:   Body Structure / Function / Physical Skills: UE functional use, Coordination, FMC, Dexterity, Strength, ROM Cognitive Skills: Attention, Memory, Emotional, Problem Solve, Safety Awareness     Visit Diagnosis: Muscle weakness (generalized)  Other lack of coordination    Problem List Patient Active Problem List   Diagnosis Date Noted  . Sepsis ([place] 10/18/2018    EHarrel Carina MS,  OTR/L 06/05/2019, 5:17 PM  [place] MAIN [facility] SERVICES [address] [place], [place], [address] Phone: [phone]   Fax:  [phone]  Name: Kenneth Summers MRN: [id number] Date of Birth: Aug 20, 1945

## 2019-06-06 ENCOUNTER — Encounter: Payer: Self-pay | Admitting: Physical Therapy

## 2019-06-06 NOTE — Therapy (Signed)
[place] MAIN [facility] SERVICES [address] [place], [place], [address] Phone: [phone]   Fax:  [phone]  Physical Therapy Treatment  Patient Details  Name: Kenneth Summers MRN: [id number] Date of Birth: 1945/04/19 Referring Provider (PT): Dr. [name], Guy Begin   Encounter Date: 06/05/2019  PT End of Session - 06/06/19 0932    Visit Number  [id number]    Number of Visits  180    Date for PT Re-Evaluation  08/20/19    Authorization Time Period  goals last assessed 05/26/19    PT Start Time  1347    PT Stop Time  1430    PT Time Calculation (min)  43 min    Equipment Utilized During Treatment  Gait belt    Activity Tolerance  Patient tolerated treatment well;No increased pain    Behavior During Therapy  WFL for tasks assessed/performed       Past Medical History:  Diagnosis Date  . Actinic keratosis   . Alcohol abuse   . APS (antiphospholipid syndrome) ([place])   . Basal cell carcinoma   . BPH (benign prostatic hyperplasia)   . CAD (coronary artery disease)   . Cellulitis   . Cervical spinal stenosis   . Chronic osteomyelitis ([place])   . CKD (chronic kidney disease)   . CKD (chronic kidney disease)   . Clostridium difficile diarrhea   . Depression   . Diplopia   . Fatigue   . GERD (gastroesophageal reflux disease)   . Hyperlipemia   . Hypertension   . Hypovitaminosis D   . IBS (irritable bowel syndrome)   . 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 Insomnia   . Left lumbosacral radiculopathy   . Moderate obstructive sleep apnea   . Osteomyelitis of foot ([place])   . PAD (peripheral artery disease) ([place])   . Pruritus   . RA (rheumatoid arthritis) ([place])   . Radiculopathy   . Restless legs syndrome   . RLS (restless legs syndrome)   . SLE (systemic lupus erythematosus related syndrome) ([place])   . 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 Stroke ([place])   . Toxic maculopathy of both eyes     Past Surgical  History:  Procedure Laterality Date  . CARDIAC CATHETERIZATION    . CAROTID ENDARTERECTOMY    . CERVICAL LAMINECTOMY    . CORONARY ANGIOPLASTY    . FRACTURE SURGERY    . fusion C5-6-7    . HERNIA REPAIR    . KNEE ARTHROSCOPY    . TONSILLECTOMY      There were no vitals filed for this visit.  Subjective Assessment - 06/06/19 0930    Subjective  Patient reports increased fatigue. Daughter has reached out to physician regarding new onset of fatigue. He has recently started Keppra for seizure control which could be causing fatigue. No new falls. Increased pain reported in left hand. No other new symptoms.    Patient is accompained by:  Family member    Pertinent History  Patient is a 74 y.o. male who presents to outpatient physical therapy with a referral for medical diagnosis of CVA. This patient's chief complaints consist of left hemiplegia and overall deconditioning leading to the following functional deficits: dependent for ADLs, IADLs, unable to transfer to car, dependent transfers at home with hoyer lift, unable to walk or stand without significant assistance, difficulty with W/C navigation..    Limitations  Sitting;Lifting;Standing;House hold activities;Writing;Walking;Other (comment)  Diagnostic tests  Brain MRI 11/01/2017: IMPRESSION: 1. Acute right ACA territory infarct as demonstrated on prior CT imaging. No intracranial hemorrhage or significant mass effect. 2. 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    Patient Stated Goals  wants to be able to walk again    Currently in Pain?  Yes    Pain Score  6     Pain Location  Hand    Pain Orientation  Left    Pain Descriptors / Indicators  Aching;Sore    Pain Type  Acute pain    Pain Onset  1 to 4 weeks ago    Pain Frequency  Intermittent    Aggravating Factors   unsure    Pain Relieving Factors  medication    Effect of Pain on Daily Activities  decreased activity tolerance          TREATMENT: Patient instructed in sit<>stand transfer in parallel bars; adjusted power chair to 27 inch height to allow better transfer ability;  Patient required min A for scooting forward to edge of chair. He exhibits heavy left lateral and posterior lean requiring instruction and tactile cues for reposition to midline.  Sit<>Stand x1 rep, mod A +1 with mod VCS for forward weight shift and cues for shifting to midline. Patient had significant difficulty and had to sit down due to fatigue;  Following therapeutic activity:  Patient sitting in chair PT elevated LLE into long sit, instructed patient in quad sets 5 sec hold x10 reps with good quad activation; He does require cues to relax between repetition for better muscle contraction; Hip flexion march x10 reps with visual cues to increase AROM LAQ x10 reps each LE with visual cues to increase AROM and cues to slow down each LE for better motor control; Isometric hip abduction with therapist resistance 5 sec hold x10 reps;  Response to treatment; Tolerated fair, patient heavily fatigued this session, likely due to starting Keppra for anti-seizure. Daughter is following up with MD regarding new behavior change. Patient educated on importance of proper positioning; reinforced HEP; Patient verbalized understanding;                      PT Education - 06/06/19 0932    Education Details  transfers, strengthening, HEP    Person(s) Educated  Patient;Child(ren)    Methods  Explanation;Verbal cues    Comprehension  Verbalized understanding;Returned demonstration;Verbal cues required;Need further instruction       PT Short Term Goals - 05/28/19 0717      PT SHORT TERM GOAL #1   Title  Be independent with initial home exercise program for self-management of symptoms.    Baseline  HEP to be given at second session; advice to practice unsupported sitting at home (06/19/2018); 07/08/18: Performing at home, 6/29: needs  assistance but is doing exercise program regularly; 08/22/18: adherent;    Time  2    Period  Weeks    Status  Achieved    Target Date  06/26/18        PT Long Term Goals - 05/28/19 0717      PT LONG TERM GOAL #1   Title  Patient will complete all bed mobility with min A to improve functional independence for getting in and out of bed and adjusting in bed.     Baseline  max A - total A reported by family (06/19/2018); 07/08/18: still requires maxA, dtr reports improvement in rolling since starting  therapy; 6/23: min A to supervision in clinic; not doing at home right; 8/5: Pt's daughter states that his rolling has improved, but still has difficulty with all other bed mobility; 10/23/18: pts daughter states that she is doing about 35% of the work if he is in proper position, he does uses the bed rails to help roll;  using Harrel Lemon for coming to sit EOB; 10/12: pts daughter states that she is doing about 30% of the work if he is in proper position, he does uses the bed rails to help roll, but it is improving;  using Hoyer for coming to sit EOB, 10/29: min A for sit to sidelying, rolling supervision, able to transition sidelying to sit with HHA;    Time  12    Period  Weeks    Status  Achieved    Target Date  12/04/18      PT LONG TERM GOAL #2   Title  Patient will complete sit <> stand transfer chair to chair with Min A and LRAD to improve functional independence for household and community mobility.     Baseline  supervision for STS, still requires elevated surface height to 27"    Time  12    Period  Weeks    Status  Achieved    Target Date  05/23/19      PT LONG TERM GOAL #3   Title  Patient will navigate power w/c with min A x 100 feet to improve mobility for household and short community distances.    Baseline  required total A (06/12/2018); able to roll 20 feet with min A (06/20/2018); 07/08/18: Pt can go approximately 10' without assist per family;  10/23/2018: pt's daughter states that his power  WC mobility has become 100% better with the new hand control, slowed speed, and with practice; 10/12: pt's daughter reports that he can perform household WC mobility and limited community ambulation with minA    Time  12    Period  Weeks    Status  Achieved    Target Date  05/23/19      PT LONG TERM GOAL #4   Title  Patient will complete car transfers W/C to family SUV with min A using LRAD to improve his abilty to participate in community activities.     Baseline  unable to complete car transfers at all (06/12/2018); unable to complete car transfers at all (06/19/2018).; 07/08/18: Unable to perform at this time; 09/11/18: unable to perform at this time; 10/23/2018: unable to complete car transfers at all; 10/12:  unable to complete car transfers at all, 10/29: unable; 01/13/19: unable; 2/11: min-mod A +2, 3/10: min A+1    Time  12    Period  Weeks    Status  Achieved    Target Date  05/23/19      PT LONG TERM GOAL #5   Title  Patient will ambulate at least 10 feet with min A using LRAD to improve mobility for household and community distances.    Baseline  unable to take steps (06/12/2018); able to shift R leg forward and back with max A and RW at edge of plinth (06/19/2018); 07/08/18: unable to ambulate at this time. 09/11/18: unable to perform at this time; 10/23/2018: unable to perform at this time; 10/12: unable to perform at this time, 10/29: unable at this time; 01/13/19: unable at this time, 2/11: min A to step with RLE, unable to step with LLE, 3/10: able to take 2  steps in parallel bars with min A for safety; 4/1: min A for walking 10 feet in parallel bars    Time  12    Period  Weeks    Status  Partially Met    Target Date  08/20/19      Additional Long Term Goals   Additional Long Term Goals  Yes      PT LONG TERM GOAL #6   Title  Patient will be able to safety navigate up/down ramp with power chair, exhibiting good safety awareness, mod I to safely enter/exit his home.    Baseline  2/11:  supervision with intermittent min A;4/1: supervision;    Time  12    Period  Weeks    Status  Partially Met    Target Date  08/20/19      PT LONG TERM GOAL #7   Title  Patient will increase functional reach test to >15 inches in sitting to exhibit improved sitting balance and positioning and reduce fall risk;    Baseline  07/30/18: 12 inches; 09/11/18: 10-12 inches; 10/23/18: 12-13 inch; 10/12: 14.5", 10/29: 15.5 inch    Time  4    Period  Weeks    Status  Achieved    Target Date  05/23/19      PT LONG TERM GOAL #8   Title  Patient will increase LLE quad strength to 3+/5 to improve functional strength for standing and mobility;    Baseline  10/28: 2+/5; 01/13/19: 2+/5, 2/11: 3-/5, 04/16/19: 3-/5, 05/08/19: 3-/5    Time  12    Period  Weeks    Status  Partially Met    Target Date  08/20/19      PT LONG TERM GOAL  #9   TITLE  Patient will report no vertigo with provoking motions or positions in order to be able to perform ADLs and mobility tasks without symptoms.    Baseline  reports severe dizziness with room spinning causing nausea and vomitting;    Time  12    Period  Weeks    Status  New    Target Date  08/20/19            Plan - 06/06/19 0933    Clinical Impression Statement  Patient motivated but complains of increased fatigue this session. He presents with flater affect and is slower to respond to questions and instruction. Attempted sit<>Stand transfers but patient exhibits poor trunk control with heavy left lateral lean and posterior lean. Focused on seated LE strengthening exercise. Patient required closed environment for less distraction. He required cues incuding visual, verbal and tactile cues for optimal muscle activation. He would benefit from additional skilled PT intervention to improve strength, balance and gait safety.    Comorbidities  Relevant past medical history and comorbidities include long term steroid use for lupus, CKD, chronic osteomyelitis, cervical spine  stenosis, BPH, APS, alcohol abuse, peripheral artery disease, depression, GERD, obstructive sleep apnea, HTN, IBS, IgA deficiency, lumbar radiculopathy, RA, Stroke, toxic maculopathy in both eyes, systemic lupus erythematosus related syndrome, cardiac catheterization, carotid endarterectomy, cervical laminectomy, coronary angioplasty, Fusion C5-C7, knee arthroplasty, lumbar surgery.    Rehab Potential  Fair    PT Frequency  3x / week    PT Duration  12 weeks    PT Treatment/Interventions  ADLs/Self Care Home Management;Electrical Stimulation;Moist Heat;Cryotherapy;Gait training;Stair training;Functional mobility training;Therapeutic exercise;Balance training;Neuromuscular re-education;Cognitive remediation;Patient/family education;Orthotic Fit/Training;Wheelchair mobility training;Manual techniques;Passive range of motion;Energy conservation;Joint Manipulations;Canalith Repostioning;Therapeutic activities;Vestibular    PT Next  Visit Plan  Work on strength, sitting/standing balance, functional activities such as rolling, bed mobility, transfers; caregiver training    PT Home Exercise Plan  no updates this session    Consulted and Agree with Plan of Care  Patient;Family member/caregiver    Family Member Consulted  daughter [name]       Patient will benefit from skilled therapeutic intervention in order to improve the following deficits and impairments:  Abnormal gait, Decreased activity tolerance, Decreased cognition, Decreased endurance, Decreased knowledge of use of DME, Decreased range of motion, Decreased skin integrity, Decreased strength, Impaired perceived functional ability, Impaired sensation, Impaired UE functional use, Improper body mechanics, Pain, Cardiopulmonary status limiting activity, Decreased balance, Decreased coordination, Decreased mobility, Difficulty walking, Impaired tone, Postural dysfunction, Dizziness  Visit Diagnosis: Muscle weakness (generalized)  Other lack of  coordination  Difficulty in walking, not elsewhere classified  Repeated falls     Problem List Patient Active Problem List   Diagnosis Date Noted  . Sepsis ([place]) 10/18/2018    Maaz Spiering PT, DPT 06/06/2019, 9:36 AM  [place] MAIN [facility] SERVICES [address] [place], [place], [address] Phone: [phone]   Fax:  [phone]  Name: ERCELL RAZON MRN: [id number] Date of Birth: 02/16/1945

## 2019-06-09 ENCOUNTER — Encounter: Payer: Self-pay | Admitting: Occupational Therapy

## 2019-06-09 ENCOUNTER — Ambulatory Visit: Payer: Medicare Other | Admitting: Occupational Therapy

## 2019-06-09 ENCOUNTER — Other Ambulatory Visit: Payer: Self-pay

## 2019-06-09 ENCOUNTER — Ambulatory Visit: Payer: Medicare Other | Attending: Family Medicine | Admitting: Physical Therapy

## 2019-06-09 ENCOUNTER — Encounter: Payer: Self-pay | Admitting: Physical Therapy

## 2019-06-09 DIAGNOSIS — R278 Other lack of coordination: Secondary | ICD-10-CM | POA: Insufficient documentation

## 2019-06-09 DIAGNOSIS — R262 Difficulty in walking, not elsewhere classified: Secondary | ICD-10-CM | POA: Insufficient documentation

## 2019-06-09 DIAGNOSIS — M6281 Muscle weakness (generalized): Secondary | ICD-10-CM

## 2019-06-09 DIAGNOSIS — I69354 Hemiplegia and hemiparesis following cerebral infarction affecting left non-dominant side: Secondary | ICD-10-CM | POA: Insufficient documentation

## 2019-06-09 DIAGNOSIS — R296 Repeated falls: Secondary | ICD-10-CM | POA: Insufficient documentation

## 2019-06-09 DIAGNOSIS — H547 Unspecified visual loss: Secondary | ICD-10-CM | POA: Insufficient documentation

## 2019-06-09 NOTE — Therapy (Signed)
[place] MAIN [facility] SERVICES [address] [place], [place], [address] Phone: [phone]   Fax:  [phone]  Occupational Therapy Progress Note  Dates of reporting period  05/05/2019  to   06/09/2019  Patient Details  Name: Kenneth Summers MRN: [id number] Date of Birth: [birth year] No data recorded  Encounter Date: 06/09/2019  OT End of Session - 06/09/19 1359    Visit Number  110    Number of Visits  157    Date for OT Re-Evaluation  06/30/19    Authorization Type  Progress report period starting 12/26/2018    OT Start Time  1352    OT Stop Time  1430    OT Time Calculation (min)  38 min    Activity Tolerance  Patient tolerated treatment well;Patient limited by fatigue    Behavior During Therapy  [facility] for tasks assessed/performed       Past Medical History:  Diagnosis Date  . Actinic keratosis   . Alcohol abuse   . APS (antiphospholipid syndrome) ([place])   . Basal cell carcinoma   . BPH (benign prostatic hyperplasia)   . CAD (coronary artery disease)   . Cellulitis   . Cervical spinal stenosis   . Chronic osteomyelitis ([place])   . CKD (chronic kidney disease)   . CKD (chronic kidney disease)   . Clostridium difficile diarrhea   . Depression   . Diplopia   . Fatigue   . GERD (gastroesophageal reflux disease)   . Hyperlipemia   . Hypertension   . Hypovitaminosis D   . IBS (irritable bowel syndrome)   . IgA deficiency ([place])   . Insomnia   . Left lumbosacral radiculopathy   . Moderate obstructive sleep apnea   . Osteomyelitis of foot ([place])   . PAD (peripheral artery disease) ([place])   . Pruritus   . RA (rheumatoid arthritis) ([place])   . Radiculopathy   . Restless legs syndrome   . RLS (restless legs syndrome)   . SLE (systemic lupus erythematosus related syndrome) ([place])   . 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 Stroke ([place])   . Toxic maculopathy of both eyes     Past Surgical History:   Procedure Laterality Date  . CARDIAC CATHETERIZATION    . CAROTID ENDARTERECTOMY    . CERVICAL LAMINECTOMY    . CORONARY ANGIOPLASTY    . FRACTURE SURGERY    . fusion C5-6-7    . HERNIA REPAIR    . KNEE ARTHROSCOPY    . TONSILLECTOMY      There were no vitals filed for this visit.  Subjective Assessment - 06/09/19 1358    Subjective   Pt. reports no pain today.    Patient is accompanied by:  Family member    Pertinent History  Pt. is a 74 y.o. 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 Pt. PMHx includes: Multiple Falls, Lupus, DJD, Renal Abscess, CVA. Pt. resides with his wife. Pt.'s wife and daughter assist with ADLs. Pt. has caregivers in  for 2 hours a day, 6 days a week. Pt. received Rehab services in acute care, at SNF for STR, and [place] services. Pt. is retired from [employer] for [employer] and [employer].    Patient Stated Goals  To regain use of his left UE, and do more for himself.    Currently in Pain?  No/denies  OT TREATMENT    Therapeutic Activities:  Pt. worked on Financial risk analyst PCP Administrator, arts. Pt. worked on Administrator, arts patterns to construct a PCP pipe tower. Pt. required less cuing today, and presented with improved task initiation with his LUE.  with his bilateral UEs when constructing the tower. Pt. required cues to find, and initiate corrections on the design pattern. Pt. Required less cues to incorporate his left hand during the task.   Pt. reports that he has stopped taking the seizure medication. Pt. Appeared to be much less fatigued today.  Pt. required less time to complete the task, and fewer cues were required to incorporate his Left UE during the task. Pt. required verbal cues to copy the design in the forward direction, as he created the design patterns backwards. Pt. continues to make steady progress , Pt. continues to work on improving LUE functioning, left sided awareness, and processing skills needed  for ADL, and IADL functioning.                         OT Education - 06/09/19 1359    Education Details  Left UE functioning, left sided awareness.    Person(s) Educated  Patient    Methods  Explanation;Demonstration    Comprehension  Returned demonstration;Tactile cues required;Verbalized understanding;Need further instruction          OT Long Term Goals - 06/09/19 1407      OT LONG TERM GOAL #1   Title  Pt. will increase left shoulder flexion AROM by 10 degrees to access cabinet/shelf.    Baseline  Shoulder flexion:125. Pt. continues to work on reaching with increasing emphasis on flexing trunk to improve functional reach.    Time  12    Period  Weeks    Status  Partially Met    Target Date  06/30/19      OT LONG TERM GOAL #2   Title  Pt. will donn a shirt with Supervision.    Baseline  Pt. continues to require MinA donning a zip down jacket with consistent cues to initiate the correct direction of the jacket.    Time  12    Period  Weeks    Status  New    Target Date  06/30/19      OT LONG TERM GOAL #4   Title  Pt. will improve left grip strength by 10# to be able to open a jar/container.    Baseline  Pt. continues to progress with grip strength, and continues to work towards opening containers, and bottles.    Time  12    Period  Weeks    Status  On-going    Target Date  06/30/19      OT LONG TERM GOAL #5   Title  Pt. will improve left hand [facility] skills to be able to assist with buttoning/zipping.    Baseline  Pt. is improving with manipulating the zipper on his jacket, however continues to require practice. Pt. continues to work on improving bilateral [facility] skills for buttoning/zipping    Time  12    Period  Weeks    Status  On-going    Target Date  06/30/19      OT LONG TERM GOAL #6   Title  Pt. will demonstrate visual conmpensatory strategies for 100% of the time during ADLs    Baseline  Pt. requires cues for left sides awareness.    Time   12  Period  Weeks    Status  On-going    Target Date  06/30/19      OT LONG TERM GOAL  #9   TITLE  Pt. will  navigate his w/c around obstacles with Supervision and 100% accuracy    Baseline  Pt. continues to require cuing for obstacle on the left, however requires less cues.    Time  12    Period  Weeks    Status  On-going    Target Date  06/30/19      OT LONG TERM GOAL  #11   TITLE  Pt. will increase BUE strength by 69m grades in preparation for ADL transfers.    Baseline  Pt. continues to present with limited BUE strength    Time  12    Period  Weeks    Status  On-going    Target Date  06/30/19            Plan - 06/09/19 1403    Clinical Impression Statement  Pt. reports that he has stopped taking the seizure medication. Pt. Appeared to be much less fatigued today.  Pt. required less time to complete the task, and fewer cues were required to incorporate his Left UE during the task. Pt. required verbal cues to copy the design in the forward direction, as he created the design patterns backwards. Pt. continues to make steady progress , Pt. continues to work on improving LUE functioning, left sided awareness, and processing skills needed for ADL, and IADL functioning.     OT Occupational Profile and History  Problem Focused Assessment - Including review of records relating to presenting problem    Occupational performance deficits (Please refer to evaluation for details):  ADL's    Body Structure / Function / Physical Skills  UE functional use;Coordination;FMC;Dexterity;Strength;ROM    Cognitive Skills  Attention;Memory;Emotional;Problem Solve;Safety Awareness    Rehab Potential  Good    Clinical Decision Making  Several treatment options, min-mod task modification necessary    Comorbidities Affecting Occupational Performance:  Presence of comorbidities impacting occupational performance    Comorbidities impacting occupational performance description:  Phyical, cognitive,  visual,  medical comorbidities    Modification or Assistance to Complete Evaluation   No modification of tasks or assist necessary to complete eval    OT Frequency  2x / week    OT Treatment/Interventions  Self-care/ADL training;DME and/or AE instruction;Therapeutic exercise;Therapeutic activities;Moist Heat;Cognitive remediation/compensation;Neuromuscular education;Visual/perceptual remediation/compensation;Coping strategies training;Patient/family education;Passive range of motion;Psychosocial skills training;Energy conservation;Functional Mobility Training    Consulted and Agree with Plan of Care  Patient    Family Member Consulted  Daughter JMarcie Bal      Patient will benefit from skilled therapeutic intervention in order to improve the following deficits and impairments:   Body Structure / Function / Physical Skills: UE functional use, Coordination, FMC, Dexterity, Strength, ROM Cognitive Skills: Attention, Memory, Emotional, Problem Solve, Safety Awareness     Visit Diagnosis: Muscle weakness (generalized)  Other lack of coordination    Problem List Patient Active Problem List   Diagnosis Date Noted  . Sepsis ([place] 10/18/2018    EHarrel Carina MS, OTR/L 06/09/2019, 2:12 PM  [place] [facility] [address] [address]Phone: [phone]  Fax:  [phone] Name: Kenneth TOUCHETMRN: [id number] of Birth: [birth year]

## 2019-06-09 NOTE — Therapy (Signed)
[place] MAIN [facility] SERVICES [address] [place], [place], [address] Phone: [phone]   Fax:  [phone]  Physical Therapy Treatment  Patient Details  Name: Kenneth Summers MRN: [id number] Date of Birth: 1945-08-30 Referring Provider (PT): Dr. [name], Guy Begin   Encounter Date: 06/09/2019  PT End of Session - 06/09/19 1257    Visit Number  [id number]    Number of Visits  180    Date for PT Re-Evaluation  08/20/19    Authorization Time Period  goals last assessed 05/26/19    PT Start Time  1302    PT Stop Time  1345    PT Time Calculation (min)  43 min    Equipment Utilized During Treatment  Gait belt    Activity Tolerance  Patient tolerated treatment well;No increased pain    Behavior During Therapy  WFL for tasks assessed/performed       Past Medical History:  Diagnosis Date  . Actinic keratosis   . Alcohol abuse   . APS (antiphospholipid syndrome) ([place])   . Basal cell carcinoma   . BPH (benign prostatic hyperplasia)   . CAD (coronary artery disease)   . Cellulitis   . Cervical spinal stenosis   . Chronic osteomyelitis ([place])   . CKD (chronic kidney disease)   . CKD (chronic kidney disease)   . Clostridium difficile diarrhea   . Depression   . Diplopia   . Fatigue   . GERD (gastroesophageal reflux disease)   . Hyperlipemia   . Hypertension   . Hypovitaminosis D   . IBS (irritable bowel syndrome)   . 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 Insomnia   . Left lumbosacral radiculopathy   . Moderate obstructive sleep apnea   . Osteomyelitis of foot ([place])   . PAD (peripheral artery disease) ([place])   . Pruritus   . RA (rheumatoid arthritis) ([place])   . Radiculopathy   . Restless legs syndrome   . RLS (restless legs syndrome)   . SLE (systemic lupus erythematosus related syndrome) ([place])   . 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 Stroke ([place])   . Toxic maculopathy of both eyes     Past Surgical  History:  Procedure Laterality Date  . CARDIAC CATHETERIZATION    . CAROTID ENDARTERECTOMY    . CERVICAL LAMINECTOMY    . CORONARY ANGIOPLASTY    . FRACTURE SURGERY    . fusion C5-6-7    . HERNIA REPAIR    . KNEE ARTHROSCOPY    . TONSILLECTOMY      There were no vitals filed for this visit.  Subjective Assessment - 06/09/19 1331    Subjective  Patient reports doing better since stopping Keflex. He reports having more energy and is more himself. He reports having a dizzy spell this morning when getting dressed. He is also still having left hand pain;    Patient is accompained by:  Family member    Pertinent History  Patient is a 74 y.o. male who presents to outpatient physical therapy with a referral for medical diagnosis of CVA. This patient's chief complaints consist of left hemiplegia and overall deconditioning leading to the following functional deficits: dependent for ADLs, IADLs, unable to transfer to car, dependent transfers at home with hoyer lift, unable to walk or stand without significant assistance, difficulty with W/C navigation..    Limitations  Sitting;Lifting;Standing;House hold activities;Writing;Walking;Other (comment)    Diagnostic tests  Brain MRI 11/01/2017: IMPRESSION: 1. Acute right ACA territory infarct as demonstrated on prior CT imaging. No intracranial hemorrhage or significant mass effect. 2. 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    Patient Stated Goals  wants to be able to walk again    Currently in Pain?  Yes    Pain Score  3     Pain Location  Hand    Pain Orientation  Left    Pain Descriptors / Indicators  Aching;Sore    Pain Type  Acute pain    Pain Onset  1 to 4 weeks ago    Pain Frequency  Intermittent    Aggravating Factors   unsure    Pain Relieving Factors  medication    Effect of Pain on Daily Activities  decreased activity tolerance;    Multiple Pain Sites  No                 TREATMENT: Patient  transferred stand pivot wheelchair to mat table with min A +2 with cues for hand placement and proper positioning;  Patient required mod A +2 to transition to supine position  PT performed a horizontal roll test which was negative bilaterally; patient was in an inclined position;  Then transitioned to sitting with mod A +2, performed dix hallpike test each side with mod A+2 for proper positioning in reclined (trendelenburg position) Patient tested negative bilaterally;  Will continue to assess as patient experiences dizziness  He required mod A +2 for stand pivot to wheelchair;  Prior to transfersand in between transfers: Patient sittingin wheelchair: Instructed patient in LE strengthening exercise, applied 2# ankle weight for better strengthening: -hip flexion [id number] bilaterally -LAQ x10 reps each LE;  Ptrequires verbal and visual cues to improve ROM;He was able to exhibit better trunk control but does have difficulty increasing LLE AROM this session;  Following exercise: Transferred sit<>Stand in parallel bars (seat adjusted to 27 inch from floor height) x4reps throughout session;He was able to transfermin A-CGAwithminVCs for forward weight shift for better trunk control and transfer ability; By the last transfer he was able to progress to supervision with better forward trunk lean and improved LE muscle activation;   Standing in parallel bars: Standing,CGA to close supervision with min VCs for weight shift and increased extension for better stance control; -Mini squat with B rail assist x10reps with cues to increase knee extension in BLE between repetitions; -weight shift to neutral, with cues for hip extension   Patient initially exhibits flexed posture with decreased hip extension; He was able to exhibit improved hip extension during subsequent transfers; Although he continues to require min A for safety and trunk control;  Patient required minA  to scoot back in chair at end of session due to increased fatigue  Response to treatment: [place].He does report increased difficulty with transfers this session. This is likely due to patient participating more and therapist assisting less. He was able to progress to supervision for sit<>Stand transfers by end of session with better forward weight shift and trunk control;  He does report increased fatigue at end of session;            PT Education - 06/09/19 1256    Education Details  transfers, strengthening, HEP    Person(s) Educated  Patient;Child(ren)    Methods  Explanation;Verbal cues    Comprehension  Verbalized understanding;Returned demonstration;Verbal cues required;Need further instruction       PT Short Term Goals - 05/28/19  0717      PT SHORT TERM GOAL #1   Title  Be independent with initial home exercise program for self-management of symptoms.    Baseline  HEP to be given at second session; advice to practice unsupported sitting at home (06/19/2018); 07/08/18: Performing at home, 6/29: needs assistance but is doing exercise program regularly; 08/22/18: adherent;    Time  2    Period  Weeks    Status  Achieved    Target Date  06/26/18        PT Long Term Goals - 05/28/19 0717      PT LONG TERM GOAL #1   Title  Patient will complete all bed mobility with min A to improve functional independence for getting in and out of bed and adjusting in bed.     Baseline  max A - total A reported by family (06/19/2018); 07/08/18: still requires maxA, dtr reports improvement in rolling since starting therapy; 6/23: min A to supervision in clinic; not doing at home right; 8/5: Pt's daughter states that his rolling has improved, but still has difficulty with all other bed mobility; 10/23/18: pts daughter states that she is doing about 35% of the work if he is in proper position, he does uses the bed rails to help roll;  using Hoyer for coming to sit EOB; 10/12: pts daughter  states that she is doing about 30% of the work if he is in proper position, he does uses the bed rails to help roll, but it is improving;  using Hoyer for coming to sit EOB, 10/29: min A for sit to sidelying, rolling supervision, able to transition sidelying to sit with HHA;    Time  12    Period  Weeks    Status  Achieved    Target Date  12/04/18      PT LONG TERM GOAL #2   Title  Patient will complete sit <> stand transfer chair to chair with Min A and LRAD to improve functional independence for household and community mobility.     Baseline  supervision for STS, still requires elevated surface height to 27"    Time  12    Period  Weeks    Status  Achieved    Target Date  05/23/19      PT LONG TERM GOAL #3   Title  Patient will navigate power w/c with min A x 100 feet to improve mobility for household and short community distances.    Baseline  required total A (06/12/2018); able to roll 20 feet with min A (06/20/2018); 07/08/18: Pt can go approximately 10' without assist per family;  10/23/2018: pt's daughter states that his power WC mobility has become 100% better with the new hand control, slowed speed, and with practice; 10/12: pt's daughter reports that he can perform household WC mobility and limited community ambulation with minA    Time  12    Period  Weeks    Status  Achieved    Target Date  05/23/19      PT LONG TERM GOAL #4   Title  Patient will complete car transfers W/C to family SUV with min A using LRAD to improve his abilty to participate in community activities.     Baseline  unable to complete car transfers at all (06/12/2018); unable to complete car transfers at all (06/19/2018).; 07/08/18: Unable to perform at this time; 09/11/18: unable to perform at this time; 10/23/2018: unable to complete car transfers  at all; 10/12:  unable to complete car transfers at all, 10/29: unable; 01/13/19: unable; 2/11: min-mod A +2, 3/10: min A+1    Time  12    Period  Weeks    Status  Achieved     Target Date  05/23/19      PT LONG TERM GOAL #5   Title  Patient will ambulate at least 10 feet with min A using LRAD to improve mobility for household and community distances.    Baseline  unable to take steps (06/12/2018); able to shift R leg forward and back with max A and RW at edge of plinth (06/19/2018); 07/08/18: unable to ambulate at this time. 09/11/18: unable to perform at this time; 10/23/2018: unable to perform at this time; 10/12: unable to perform at this time, 10/29: unable at this time; 01/13/19: unable at this time, 2/11: min A to step with RLE, unable to step with LLE, 3/10: able to take 2 steps in parallel bars with min A for safety; 4/1: min A for walking 10 feet in parallel bars    Time  12    Period  Weeks    Status  Partially Met    Target Date  08/20/19      Additional Long Term Goals   Additional Long Term Goals  Yes      PT LONG TERM GOAL #6   Title  Patient will be able to safety navigate up/down ramp with power chair, exhibiting good safety awareness, mod I to safely enter/exit his home.    Baseline  2/11: supervision with intermittent min A;4/1: supervision;    Time  12    Period  Weeks    Status  Partially Met    Target Date  08/20/19      PT LONG TERM GOAL #7   Title  Patient will increase functional reach test to >15 inches in sitting to exhibit improved sitting balance and positioning and reduce fall risk;    Baseline  07/30/18: 12 inches; 09/11/18: 10-12 inches; 10/23/18: 12-13 inch; 10/12: 14.5", 10/29: 15.5 inch    Time  4    Period  Weeks    Status  Achieved    Target Date  05/23/19      PT LONG TERM GOAL #8   Title  Patient will increase LLE quad strength to 3+/5 to improve functional strength for standing and mobility;    Baseline  10/28: 2+/5; 01/13/19: 2+/5, 2/11: 3-/5, 04/16/19: 3-/5, 05/08/19: 3-/5    Time  12    Period  Weeks    Status  Partially Met    Target Date  08/20/19      PT LONG TERM GOAL  #9   TITLE  Patient will report no vertigo with  provoking motions or positions in order to be able to perform ADLs and mobility tasks without symptoms.    Baseline  reports severe dizziness with room spinning causing nausea and vomitting;    Time  12    Period  Weeks    Status  New    Target Date  08/20/19            Plan - 06/09/19 1531    Clinical Impression Statement  Patient motivated and participated well within session; He does complain of increased dizziness this session. PT performed horizontal roll and dix hallpike test which was negative. Patient does require mod A +2 for supine to sit transitions due to weakness and poor trunk control. Patient then  transitioned to parallel bars to work on sit<>stand transfers. He was able to progress from min A to supervision sit<>Stand with elevated wheelchair; He does require cues for forward weight shift for better transfer ability. Patient does fatigue with prolonged standing activities. He would benefit from additional skilled PT intervention to improve strength, balance and mobility;    Comorbidities  Relevant past medical history and comorbidities include long term steroid use for lupus, CKD, chronic osteomyelitis, cervical spine stenosis, BPH, APS, alcohol abuse, peripheral artery disease, depression, GERD, obstructive sleep apnea, HTN, IBS, IgA deficiency, lumbar radiculopathy, RA, Stroke, toxic maculopathy in both eyes, systemic lupus erythematosus related syndrome, cardiac catheterization, carotid endarterectomy, cervical laminectomy, coronary angioplasty, Fusion C5-C7, knee arthroplasty, lumbar surgery.    Rehab Potential  Fair    PT Frequency  3x / week    PT Duration  12 weeks    PT Treatment/Interventions  ADLs/Self Care Home Management;Electrical Stimulation;Moist Heat;Cryotherapy;Gait training;Stair training;Functional mobility training;Therapeutic exercise;Balance training;Neuromuscular re-education;Cognitive remediation;Patient/family education;Orthotic Fit/Training;Wheelchair  mobility training;Manual techniques;Passive range of motion;Energy conservation;Joint Manipulations;Canalith Repostioning;Therapeutic activities;Vestibular    PT Next Visit Plan  Work on strength, sitting/standing balance, functional activities such as rolling, bed mobility, transfers; caregiver training    PT Home Exercise Plan  no updates this session    Consulted and Agree with Plan of Care  Patient;Family member/caregiver    Family Member Consulted  daughter [name]       Patient will benefit from skilled therapeutic intervention in order to improve the following deficits and impairments:  Abnormal gait, Decreased activity tolerance, Decreased cognition, Decreased endurance, Decreased knowledge of use of DME, Decreased range of motion, Decreased skin integrity, Decreased strength, Impaired perceived functional ability, Impaired sensation, Impaired UE functional use, Improper body mechanics, Pain, Cardiopulmonary status limiting activity, Decreased balance, Decreased coordination, Decreased mobility, Difficulty walking, Impaired tone, Postural dysfunction, Dizziness  Visit Diagnosis: Muscle weakness (generalized)  Other lack of coordination  Difficulty in walking, not elsewhere classified  Repeated falls     Problem List Patient Active Problem List   Diagnosis Date Noted  . Sepsis ([place]) 10/18/2018    Trotter,Margaret PT, DPT 06/09/2019, 3:38 PM  [place] [facility] SERVICES [address] [place], [place], [address] Phone: [phone]   Fax:  [phone]  Name: Kenneth Summers MRN: [id number] Date of Birth: 1945/09/23

## 2019-06-11 ENCOUNTER — Other Ambulatory Visit: Payer: Self-pay

## 2019-06-11 ENCOUNTER — Ambulatory Visit: Payer: Medicare Other

## 2019-06-11 VITALS — BP 105/78 | HR 75

## 2019-06-11 DIAGNOSIS — R296 Repeated falls: Secondary | ICD-10-CM

## 2019-06-11 DIAGNOSIS — M6281 Muscle weakness (generalized): Secondary | ICD-10-CM | POA: Diagnosis not present

## 2019-06-11 DIAGNOSIS — R262 Difficulty in walking, not elsewhere classified: Secondary | ICD-10-CM

## 2019-06-11 DIAGNOSIS — R278 Other lack of coordination: Secondary | ICD-10-CM

## 2019-06-11 DIAGNOSIS — I69354 Hemiplegia and hemiparesis following cerebral infarction affecting left non-dominant side: Secondary | ICD-10-CM

## 2019-06-11 NOTE — Therapy (Signed)
[place] [facility] SERVICES [address] [place], [place], [address] Phone: [phone]   Fax:  [phone]  Physical Therapy Treatment  Patient Details  Name: Kenneth Summers MRN: [id number] Date of Birth: 1946-01-31 Referring Provider (PT): Dr. [name], Guy Begin   Encounter Date: 06/11/2019  PT End of Session - 06/11/19 1323    Visit Number  [id number]    Number of Visits  180    Date for PT Re-Evaluation  08/20/19    Authorization Type  Medicare reporting period starting 09/11/18    Authorization Time Period  goals last assessed 05/26/19    PT Start Time  1304    PT Stop Time  1344    PT Time Calculation (min)  40 min    Equipment Utilized During Treatment  Gait belt    Activity Tolerance  Patient tolerated treatment well;No increased pain    Behavior During Therapy  WFL for tasks assessed/performed       Past Medical History:  Diagnosis Date  . Actinic keratosis   . Alcohol abuse   . APS (antiphospholipid syndrome) ([place])   . Basal cell carcinoma   . BPH (benign prostatic hyperplasia)   . CAD (coronary artery disease)   . Cellulitis   . Cervical spinal stenosis   . Chronic osteomyelitis ([place])   . CKD (chronic kidney disease)   . CKD (chronic kidney disease)   . Clostridium difficile diarrhea   . Depression   . Diplopia   . Fatigue   . GERD (gastroesophageal reflux disease)   . Hyperlipemia   . Hypertension   . Hypovitaminosis D   . IBS (irritable bowel syndrome)   . 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 Insomnia   . Left lumbosacral radiculopathy   . Moderate obstructive sleep apnea   . Osteomyelitis of foot ([place])   . PAD (peripheral artery disease) ([place])   . Pruritus   . RA (rheumatoid arthritis) ([place])   . Radiculopathy   . Restless legs syndrome   . RLS (restless legs syndrome)   . SLE (systemic lupus erythematosus related syndrome) ([place])   . 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 Stroke  ([place])   . Toxic maculopathy of both eyes     Past Surgical History:  Procedure Laterality Date  . CARDIAC CATHETERIZATION    . CAROTID ENDARTERECTOMY    . CERVICAL LAMINECTOMY    . CORONARY ANGIOPLASTY    . FRACTURE SURGERY    . fusion C5-6-7    . HERNIA REPAIR    . KNEE ARTHROSCOPY    . TONSILLECTOMY      Vitals:   06/11/19 1317  BP: 105/78  Pulse: 75    Subjective Assessment - 06/11/19 1321    Subjective  Doing well since last visit. No updates. Pt report sno dizziness since last session.    Patient is accompained by:  Family member    Pertinent History  Patient is a 74 y.o. male who presents to outpatient physical therapy with a referral for medical diagnosis of CVA. This patient's chief complaints consist of left hemiplegia and overall deconditioning leading to the following functional deficits: dependent for ADLs, IADLs, unable to transfer to car, dependent transfers at home with hoyer lift, unable to walk or stand without significant assistance, difficulty with W/C navigation..    Limitations  Sitting;Lifting;Standing;House hold activities;Writing;Walking;Other (comment)    Currently in Pain?  No/denies  INTERVENTION THIS DATE: BP mildly hypotensive in session without orthostasis, pt has no dizziness this date.  105/92mHg seated at beginning 1123/643mg seated after first standing interval.   TREATMENT: Seated BLE strengthening with resistance PRN -hip flexion march 2x15 right, 3x8 Left (no resistance, manual assist for full range)  -LAQ 3x10 LLE (modA for full available ROM); RLE 1x10 @ 2lb, 2x10@ 4lb   In // bars   -STS training from 27" height x4 reps throughout session, req minA-CGA with min VCs for forward weight shift for better trunk control and transfer ability;   Standing in parallel bars: -Standing CGA to close supervision with min VCs for weight shift and increased extension for better stance control;  -3xSTS from [place] BUE on // bars,  tactile/verbal cues for setup, then supervision for lift, tactile cues and Rt knee block for final upright (less than 3sec standing) fatigued at end.     Patient initially exhibits flexed posture with decreased hip extension; He was able to exhibit improved hip extension during  subsequent transfers; Although he continues to require min A for safety and trunk control;    Patient required min A to scoot back in chair at end of session due to increased fatigue Pt requires verbal and visual cues to improve ROM; He was able to exhibit better trunk control but does have difficulty increasing LLE AROM this session;       PT Short Term Goals - 05/28/19 0717      PT SHORT TERM GOAL #1   Title  Be independent with initial home exercise program for self-management of symptoms.    Baseline  HEP to be given at second session; advice to practice unsupported sitting at home (06/19/2018); 07/08/18: Performing at home, 6/29: needs assistance but is doing exercise program regularly; 08/22/18: adherent;    Time  2    Period  Weeks    Status  Achieved    Target Date  06/26/18        PT Long Term Goals - 05/28/19 0717      PT LONG TERM GOAL #1   Title  Patient will complete all bed mobility with min A to improve functional independence for getting in and out of bed and adjusting in bed.     Baseline  max A - total A reported by family (06/19/2018); 07/08/18: still requires maxA, dtr reports improvement in rolling since starting therapy; 6/23: min A to supervision in clinic; not doing at home right; 8/5: Pt's daughter states that his rolling has improved, but still has difficulty with all other bed mobility; 10/23/18: pts daughter states that she is doing about 35% of the work if he is in proper position, he does uses the bed rails to help roll;  using Hoyer for coming to sit EOB; 10/12: pts daughter states that she is doing about 30% of the work if he is in proper position, he does uses the bed rails to help roll,  but it is improving;  using Hoyer for coming to sit EOB, 10/29: min A for sit to sidelying, rolling supervision, able to transition sidelying to sit with HHA;    Time  12    Period  Weeks    Status  Achieved    Target Date  12/04/18      PT LONG TERM GOAL #2   Title  Patient will complete sit <> stand transfer chair to chair with Min A and LRAD to improve functional independence for household and  community mobility.     Baseline  supervision for STS, still requires elevated surface height to 27"    Time  12    Period  Weeks    Status  Achieved    Target Date  05/23/19      PT LONG TERM GOAL #3   Title  Patient will navigate power w/c with min A x 100 feet to improve mobility for household and short community distances.    Baseline  required total A (06/12/2018); able to roll 20 feet with min A (06/20/2018); 07/08/18: Pt can go approximately 10' without assist per family;  10/23/2018: pt's daughter states that his power WC mobility has become 100% better with the new hand control, slowed speed, and with practice; 10/12: pt's daughter reports that he can perform household WC mobility and limited community ambulation with minA    Time  12    Period  Weeks    Status  Achieved    Target Date  05/23/19      PT LONG TERM GOAL #4   Title  Patient will complete car transfers W/C to family SUV with min A using LRAD to improve his abilty to participate in community activities.     Baseline  unable to complete car transfers at all (06/12/2018); unable to complete car transfers at all (06/19/2018).; 07/08/18: Unable to perform at this time; 09/11/18: unable to perform at this time; 10/23/2018: unable to complete car transfers at all; 10/12:  unable to complete car transfers at all, 10/29: unable; 01/13/19: unable; 2/11: min-mod A +2, 3/10: min A+1    Time  12    Period  Weeks    Status  Achieved    Target Date  05/23/19      PT LONG TERM GOAL #5   Title  Patient will ambulate at least 10 feet with min A using  LRAD to improve mobility for household and community distances.    Baseline  unable to take steps (06/12/2018); able to shift R leg forward and back with max A and RW at edge of plinth (06/19/2018); 07/08/18: unable to ambulate at this time. 09/11/18: unable to perform at this time; 10/23/2018: unable to perform at this time; 10/12: unable to perform at this time, 10/29: unable at this time; 01/13/19: unable at this time, 2/11: min A to step with RLE, unable to step with LLE, 3/10: able to take 2 steps in parallel bars with min A for safety; 4/1: min A for walking 10 feet in parallel bars    Time  12    Period  Weeks    Status  Partially Met    Target Date  08/20/19      Additional Long Term Goals   Additional Long Term Goals  Yes      PT LONG TERM GOAL #6   Title  Patient will be able to safety navigate up/down ramp with power chair, exhibiting good safety awareness, mod I to safely enter/exit his home.    Baseline  2/11: supervision with intermittent min A;4/1: supervision;    Time  12    Period  Weeks    Status  Partially Met    Target Date  08/20/19      PT LONG TERM GOAL #7   Title  Patient will increase functional reach test to >15 inches in sitting to exhibit improved sitting balance and positioning and reduce fall risk;    Baseline  07/30/18: 12 inches; 09/11/18: 10-12 inches; 10/23/18:  12-13 inch; 10/12: 14.5", 10/29: 15.5 inch    Time  4    Period  Weeks    Status  Achieved    Target Date  05/23/19      PT LONG TERM GOAL #8   Title  Patient will increase LLE quad strength to 3+/5 to improve functional strength for standing and mobility;    Baseline  10/28: 2+/5; 01/13/19: 2+/5, 2/11: 3-/5, 04/16/19: 3-/5, 05/08/19: 3-/5    Time  12    Period  Weeks    Status  Partially Met    Target Date  08/20/19      PT LONG TERM GOAL  #9   TITLE  Patient will report no vertigo with provoking motions or positions in order to be able to perform ADLs and mobility tasks without symptoms.    Baseline   reports severe dizziness with room spinning causing nausea and vomitting;    Time  12    Period  Weeks    Status  New    Target Date  08/20/19            Plan - 06/11/19 1325    Clinical Impression Statement Pt able to complete entire session as planned with rest breaks provided as needed. Pt maintains high level of focus and motivation. Extensive verbal, visual, and tactile cues are provided for most accurate form possible. Author provides min-modA intermittently for full ROM when needed, particularly for LLE end range hip flexion and end range knee extension. Pt has more difficulty with sustained upright stance this date, struggling to make it to 30sec once, much shorter for others. Overall pt continues to make steady progress toward treatment goals.    Personal Factors and Comorbidities  Age;Comorbidity 3+    Comorbidities  Relevant past medical history and comorbidities include long term steroid use for lupus, CKD, chronic osteomyelitis, cervical spine stenosis, BPH, APS, alcohol abuse, peripheral artery disease, depression, GERD, obstructive sleep apnea, HTN, IBS, IgA deficiency, lumbar radiculopathy, RA, Stroke, toxic maculopathy in both eyes, systemic lupus erythematosus related syndrome, cardiac catheterization, carotid endarterectomy, cervical laminectomy, coronary angioplasty, Fusion C5-C7, knee arthroplasty, lumbar surgery.    Examination-Activity Limitations  Bed Mobility;Bend;Sit;Toileting;Stand;Stairs;Lift;Transfers;Squat;Locomotion Level;Carry;Dressing;Hygiene/Grooming;Continence    Examination-Participation Restrictions  Yard Work;Interpersonal Relationship;Community Activity    Stability/Clinical Decision Making  Evolving/Moderate complexity    Clinical Decision Making  Moderate    Rehab Potential  Fair    PT Frequency  3x / week    PT Duration  12 weeks    PT Treatment/Interventions  ADLs/Self Care Home Management;Electrical Stimulation;Moist Heat;Cryotherapy;Gait  training;Stair training;Functional mobility training;Therapeutic exercise;Balance training;Neuromuscular re-education;Cognitive remediation;Patient/family education;Orthotic Fit/Training;Wheelchair mobility training;Manual techniques;Passive range of motion;Energy conservation;Joint Manipulations;Canalith Repostioning;Therapeutic activities;Vestibular    PT Next Visit Plan  Work on strength, sitting/standing balance, functional activities such as rolling, bed mobility, transfers; caregiver training    PT Home Exercise Plan  no updates this session    Consulted and Agree with Plan of Care  Patient;Family member/caregiver    Family Member Consulted  Wife       Patient will benefit from skilled therapeutic intervention in order to improve the following deficits and impairments:  Abnormal gait, Decreased activity tolerance, Decreased cognition, Decreased endurance, Decreased knowledge of use of DME, Decreased range of motion, Decreased skin integrity, Decreased strength, Impaired perceived functional ability, Impaired sensation, Impaired UE functional use, Improper body mechanics, Pain, Cardiopulmonary status limiting activity, Decreased balance, Decreased coordination, Decreased mobility, Difficulty walking, Impaired tone, Postural dysfunction, Dizziness  Visit Diagnosis: Muscle weakness (  generalized)  Other lack of coordination  Difficulty in walking, not elsewhere classified  Repeated falls  Hemiplegia and hemiparesis following cerebral infarction affecting left non-dominant side [facility])     Problem List Patient Active Problem List   Diagnosis Date Noted  . Sepsis ([place]) 10/18/2018   2:03 PM, 06/11/19 Etta Grandchild, PT, DPT Physical Therapist - [facility]  [phone] ([address]    [place] 06/11/2019, 1:58 PM  [place] [facility] SERVICES [address] [place], [place], [address] Phone: [phone]    Fax:  [phone]  Name: Kenneth Summers MRN: [id number] Date of Birth: 1945/09/30

## 2019-06-12 ENCOUNTER — Other Ambulatory Visit: Payer: Self-pay

## 2019-06-12 ENCOUNTER — Ambulatory Visit: Payer: Medicare Other | Admitting: Physical Therapy

## 2019-06-12 ENCOUNTER — Encounter: Payer: Self-pay | Admitting: Physical Therapy

## 2019-06-12 ENCOUNTER — Encounter: Payer: Self-pay | Admitting: Occupational Therapy

## 2019-06-12 ENCOUNTER — Ambulatory Visit: Payer: Medicare Other | Admitting: Occupational Therapy

## 2019-06-12 DIAGNOSIS — R296 Repeated falls: Secondary | ICD-10-CM

## 2019-06-12 DIAGNOSIS — M6281 Muscle weakness (generalized): Secondary | ICD-10-CM | POA: Diagnosis not present

## 2019-06-12 DIAGNOSIS — R278 Other lack of coordination: Secondary | ICD-10-CM

## 2019-06-12 DIAGNOSIS — R262 Difficulty in walking, not elsewhere classified: Secondary | ICD-10-CM

## 2019-06-12 NOTE — Therapy (Signed)
[place] MAIN [facility] SERVICES [address] [place], [place], [address] Phone: [phone]   Fax:  [phone]  Physical Therapy Treatment  Patient Details  Name: Kenneth Summers MRN: [id number] Date of Birth: 10-27-1945 Referring Provider (PT): Dr. [name], Guy Begin   Encounter Date: 06/12/2019  PT End of Session - 06/12/19 1401    Visit Number  [id number]    Number of Visits  180    Date for PT Re-Evaluation  08/20/19    Authorization Type  Medicare reporting period starting 09/11/18    Authorization Time Period  goals last assessed 05/26/19    PT Start Time  1347    PT Stop Time  1430    PT Time Calculation (min)  43 min    Equipment Utilized During Treatment  Gait belt    Activity Tolerance  Patient tolerated treatment well;No increased pain    Behavior During Therapy  WFL for tasks assessed/performed       Past Medical History:  Diagnosis Date  . Actinic keratosis   . Alcohol abuse   . APS (antiphospholipid syndrome) ([place])   . Basal cell carcinoma   . BPH (benign prostatic hyperplasia)   . CAD (coronary artery disease)   . Cellulitis   . Cervical spinal stenosis   . Chronic osteomyelitis ([place])   . CKD (chronic kidney disease)   . CKD (chronic kidney disease)   . Clostridium difficile diarrhea   . Depression   . Diplopia   . Fatigue   . GERD (gastroesophageal reflux disease)   . Hyperlipemia   . Hypertension   . Hypovitaminosis D   . IBS (irritable bowel syndrome)   . 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 Insomnia   . Left lumbosacral radiculopathy   . Moderate obstructive sleep apnea   . Osteomyelitis of foot ([place])   . PAD (peripheral artery disease) ([place])   . Pruritus   . RA (rheumatoid arthritis) ([place])   . Radiculopathy   . Restless legs syndrome   . RLS (restless legs syndrome)   . SLE (systemic lupus erythematosus related syndrome) ([place])   . 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 Stroke  ([place])   . Toxic maculopathy of both eyes     Past Surgical History:  Procedure Laterality Date  . CARDIAC CATHETERIZATION    . CAROTID ENDARTERECTOMY    . CERVICAL LAMINECTOMY    . CORONARY ANGIOPLASTY    . FRACTURE SURGERY    . fusion C5-6-7    . HERNIA REPAIR    . KNEE ARTHROSCOPY    . TONSILLECTOMY      There were no vitals filed for this visit.  Subjective Assessment - 06/12/19 1400    Subjective  Patient reports doing well; no pain; no new falls; no dizziness;    Patient is accompained by:  Family member    Pertinent History  Patient is a 74 y.o. male who presents to outpatient physical therapy with a referral for medical diagnosis of CVA. This patient's chief complaints consist of left hemiplegia and overall deconditioning leading to the following functional deficits: dependent for ADLs, IADLs, unable to transfer to car, dependent transfers at home with hoyer lift, unable to walk or stand without significant assistance, difficulty with W/C navigation..    Limitations  Sitting;Lifting;Standing;House hold activities;Writing;Walking;Other (comment)    Currently in Pain?  No/denies    Multiple Pain Sites  No  TREATMENT:  Prior to transfersand in between transfers: Patient sittingin wheelchair: Instructed patient in LE strengthening exercise, applied 2# ankle weight for better strengthening: -hip flexion marchx15reps bilaterally -LAQ x15 reps each LE;  Ptrequires verbal and visual cues to improve ROM;He was able to exhibit better trunk control but does have difficulty increasing LLE AROM this session;  Following exercise: Transferred sit<>Stand in parallel bars (seat adjusted to 27 inch from floor height) x7reps throughout session;He was able to transfer close supervisionwithminVCs for forward weight shift for better trunk control and transfer ability;   Lowered chair down to 25 inch, patient required CGA to min A for safety with increased  difficulty pushing through legs.   Standing in parallel bars: Standing,CGA to close supervision with min VCs for weight shift and increased extension for better stance control; -Mini squat with B rail assist x15reps with cues to increase knee extension in BLE between repetitions; -weight shift to neutral, with cues for hip extension and to shift to neutral without heavy right lateral lean  Progressed to reducing rail assist from 2-1 intermittent rail assist x20-30 sec x2 sets to facilitate increased weight bearing in BLE and to improve LE weight acceptance.   Patient initially exhibits flexed posture with decreased hip extension; He was able to exhibit improved hip extension during subsequent transfers; Although he continues to require min A for safety and trunk control;  Patient required minA to scoot back in chair at end of session due to increased fatigue  Response to treatment: [place].He was able to exhibit better weight shift during transfers with better LE/trunk positioning. Patient does fatigue quickly being unable to stand for prolonged period of time. He does require visual cues for LE exercise to increase AROM. Patient repots moderate fatigue at end of session;                   PT Education - 06/12/19 1401    Education Details  transfers/strengthening, HEP    Person(s) Educated  Patient;Child(ren)    Methods  Explanation;Verbal cues    Comprehension  Verbalized understanding;Returned demonstration;Verbal cues required;Need further instruction       PT Short Term Goals - 05/28/19 0717      PT SHORT TERM GOAL #1   Title  Be independent with initial home exercise program for self-management of symptoms.    Baseline  HEP to be given at second session; advice to practice unsupported sitting at home (06/19/2018); 07/08/18: Performing at home, 6/29: needs assistance but is doing exercise program regularly; 08/22/18: adherent;    Time  2    Period  Weeks     Status  Achieved    Target Date  06/26/18        PT Long Term Goals - 05/28/19 0717      PT LONG TERM GOAL #1   Title  Patient will complete all bed mobility with min A to improve functional independence for getting in and out of bed and adjusting in bed.     Baseline  max A - total A reported by family (06/19/2018); 07/08/18: still requires maxA, dtr reports improvement in rolling since starting therapy; 6/23: min A to supervision in clinic; not doing at home right; 8/5: Pt's daughter states that his rolling has improved, but still has difficulty with all other bed mobility; 10/23/18: pts daughter states that she is doing about 35% of the work if he is in proper position, he does uses the bed rails to help roll;  using  Hoyer for coming to sit EOB; 10/12: pts daughter states that she is doing about 30% of the work if he is in proper position, he does uses the bed rails to help roll, but it is improving;  using Harrel Lemon for coming to sit EOB, 10/29: min A for sit to sidelying, rolling supervision, able to transition sidelying to sit with HHA;    Time  12    Period  Weeks    Status  Achieved    Target Date  12/04/18      PT LONG TERM GOAL #2   Title  Patient will complete sit <> stand transfer chair to chair with Min A and LRAD to improve functional independence for household and community mobility.     Baseline  supervision for STS, still requires elevated surface height to 27"    Time  12    Period  Weeks    Status  Achieved    Target Date  05/23/19      PT LONG TERM GOAL #3   Title  Patient will navigate power w/c with min A x 100 feet to improve mobility for household and short community distances.    Baseline  required total A (06/12/2018); able to roll 20 feet with min A (06/20/2018); 07/08/18: Pt can go approximately 10' without assist per family;  10/23/2018: pt's daughter states that his power WC mobility has become 100% better with the new hand control, slowed speed, and with practice;  10/12: pt's daughter reports that he can perform household WC mobility and limited community ambulation with minA    Time  12    Period  Weeks    Status  Achieved    Target Date  05/23/19      PT LONG TERM GOAL #4   Title  Patient will complete car transfers W/C to family SUV with min A using LRAD to improve his abilty to participate in community activities.     Baseline  unable to complete car transfers at all (06/12/2018); unable to complete car transfers at all (06/19/2018).; 07/08/18: Unable to perform at this time; 09/11/18: unable to perform at this time; 10/23/2018: unable to complete car transfers at all; 10/12:  unable to complete car transfers at all, 10/29: unable; 01/13/19: unable; 2/11: min-mod A +2, 3/10: min A+1    Time  12    Period  Weeks    Status  Achieved    Target Date  05/23/19      PT LONG TERM GOAL #5   Title  Patient will ambulate at least 10 feet with min A using LRAD to improve mobility for household and community distances.    Baseline  unable to take steps (06/12/2018); able to shift R leg forward and back with max A and RW at edge of plinth (06/19/2018); 07/08/18: unable to ambulate at this time. 09/11/18: unable to perform at this time; 10/23/2018: unable to perform at this time; 10/12: unable to perform at this time, 10/29: unable at this time; 01/13/19: unable at this time, 2/11: min A to step with RLE, unable to step with LLE, 3/10: able to take 2 steps in parallel bars with min A for safety; 4/1: min A for walking 10 feet in parallel bars    Time  12    Period  Weeks    Status  Partially Met    Target Date  08/20/19      Additional Long Term Goals   Additional Long Term Goals  Yes      PT LONG TERM GOAL #6   Title  Patient will be able to safety navigate up/down ramp with power chair, exhibiting good safety awareness, mod I to safely enter/exit his home.    Baseline  2/11: supervision with intermittent min A;4/1: supervision;    Time  12    Period  Weeks    Status   Partially Met    Target Date  08/20/19      PT LONG TERM GOAL #7   Title  Patient will increase functional reach test to >15 inches in sitting to exhibit improved sitting balance and positioning and reduce fall risk;    Baseline  07/30/18: 12 inches; 09/11/18: 10-12 inches; 10/23/18: 12-13 inch; 10/12: 14.5", 10/29: 15.5 inch    Time  4    Period  Weeks    Status  Achieved    Target Date  05/23/19      PT LONG TERM GOAL #8   Title  Patient will increase LLE quad strength to 3+/5 to improve functional strength for standing and mobility;    Baseline  10/28: 2+/5; 01/13/19: 2+/5, 2/11: 3-/5, 04/16/19: 3-/5, 05/08/19: 3-/5    Time  12    Period  Weeks    Status  Partially Met    Target Date  08/20/19      PT LONG TERM GOAL  #9   TITLE  Patient will report no vertigo with provoking motions or positions in order to be able to perform ADLs and mobility tasks without symptoms.    Baseline  reports severe dizziness with room spinning causing nausea and vomitting;    Time  12    Period  Weeks    Status  New    Target Date  08/20/19            Plan - 06/12/19 1436    Clinical Impression Statement  Patient motivated and participated well within session. He was able to exhibit better weight shift/trunk control this session with improved transfer ability. He does require elevated surface for better transfer ability. Patient instructed in weight shift to neutral in standing for better weight acceptance in BLE this session. Patient instructed in advanced LE strengthening requiring min VCs for proper exercise technique/increase AROM for better strenghtening. He would benefit from additional skilled PT intervention to improve strength, balance and mobility;    Personal Factors and Comorbidities  Age;Comorbidity 3+    Comorbidities  Relevant past medical history and comorbidities include long term steroid use for lupus, CKD, chronic osteomyelitis, cervical spine stenosis, BPH, APS, alcohol abuse,  peripheral artery disease, depression, GERD, obstructive sleep apnea, HTN, IBS, IgA deficiency, lumbar radiculopathy, RA, Stroke, toxic maculopathy in both eyes, systemic lupus erythematosus related syndrome, cardiac catheterization, carotid endarterectomy, cervical laminectomy, coronary angioplasty, Fusion C5-C7, knee arthroplasty, lumbar surgery.    Examination-Activity Limitations  Bed Mobility;Bend;Sit;Toileting;Stand;Stairs;Lift;Transfers;Squat;Locomotion Level;Carry;Dressing;Hygiene/Grooming;Continence    Examination-Participation Restrictions  Yard Work;Interpersonal Relationship;Community Activity    Stability/Clinical Decision Making  Evolving/Moderate complexity    Rehab Potential  Fair    PT Frequency  3x / week    PT Duration  12 weeks    PT Treatment/Interventions  ADLs/Self Care Home Management;Electrical Stimulation;Moist Heat;Cryotherapy;Gait training;Stair training;Functional mobility training;Therapeutic exercise;Balance training;Neuromuscular re-education;Cognitive remediation;Patient/family education;Orthotic Fit/Training;Wheelchair mobility training;Manual techniques;Passive range of motion;Energy conservation;Joint Manipulations;Canalith Repostioning;Therapeutic activities;Vestibular    PT Next Visit Plan  Work on strength, sitting/standing balance, functional activities such as rolling, bed mobility, transfers; caregiver training    PT [place]  no updates this session    Consulted and Agree with Plan of Care  Patient;Family member/caregiver    Family Member Consulted  Wife       Patient will benefit from skilled therapeutic intervention in order to improve the following deficits and impairments:  Abnormal gait, Decreased activity tolerance, Decreased cognition, Decreased endurance, Decreased knowledge of use of DME, Decreased range of motion, Decreased skin integrity, Decreased strength, Impaired perceived functional ability, Impaired sensation, Impaired UE functional  use, Improper body mechanics, Pain, Cardiopulmonary status limiting activity, Decreased balance, Decreased coordination, Decreased mobility, Difficulty walking, Impaired tone, Postural dysfunction, Dizziness  Visit Diagnosis: Muscle weakness (generalized)  Other lack of coordination  Difficulty in walking, not elsewhere classified  Repeated falls     Problem List Patient Active Problem List   Diagnosis Date Noted  . Sepsis ([place]) 10/18/2018    Jawanda Passey PT, DPT 06/12/2019, 2:37 PM  [place] [address] MAIN [facility] SERVICES [address] [place], [place], [address] Phone: [phone]   Fax:  [phone]  Name: Kenneth Summers MRN: [id number] Date of Birth: [birth year]

## 2019-06-12 NOTE — Therapy (Signed)
[place] MAIN [facility] SERVICES [address] [place], [place], [address] Phone: [phone]   Fax:  [phone]  Occupational Therapy Treatment  Kenneth Summers Details  Name: Kenneth Summers MRN: [id number] Date of Birth: 1945/07/06 No data recorded  Encounter Date: 06/12/2019  OT End of Session - 06/12/19 1312    Visit Number  [id number]    Number of Visits  157    Date for OT Re-Evaluation  06/30/19    Authorization Type  Progress report period starting 12/26/2018    OT Start Time  1300    OT Stop Time  1345    OT Time Calculation (min)  45 min    Activity Tolerance  Kenneth Summers tolerated treatment well;Kenneth Summers limited by fatigue    Behavior During Therapy  [facility] for tasks assessed/performed       Past Medical History:  Diagnosis Date  . Actinic keratosis   . Alcohol abuse   . APS (antiphospholipid syndrome) ([place])   . Basal cell carcinoma   . BPH (benign prostatic hyperplasia)   . CAD (coronary artery disease)   . Cellulitis   . Cervical spinal stenosis   . Chronic osteomyelitis ([place])   . CKD (chronic kidney disease)   . CKD (chronic kidney disease)   . Clostridium difficile diarrhea   . Depression   . Diplopia   . Fatigue   . GERD (gastroesophageal reflux disease)   . Hyperlipemia   . Hypertension   . Hypovitaminosis D   . IBS (irritable bowel syndrome)   . IgA deficiency ([place])   . Insomnia   . Left lumbosacral radiculopathy   . Moderate obstructive sleep apnea   . Osteomyelitis of foot ([place])   . PAD (peripheral artery disease) ([place])   . Pruritus   . RA (rheumatoid arthritis) ([place])   . Radiculopathy   . Restless legs syndrome   . RLS (restless legs syndrome)   . SLE (systemic lupus erythematosus related syndrome) ([place])   . 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 Stroke ([place])   . Toxic maculopathy of both eyes     Past Surgical History:  Procedure Laterality Date  . CARDIAC CATHETERIZATION    .  CAROTID ENDARTERECTOMY    . CERVICAL LAMINECTOMY    . CORONARY ANGIOPLASTY    . FRACTURE SURGERY    . fusion C5-6-7    . HERNIA REPAIR    . KNEE ARTHROSCOPY    . TONSILLECTOMY      There were no vitals filed for this visit.  Subjective Assessment - 06/12/19 1311    Subjective   Pt. reports no pain today.    Kenneth Summers is accompanied by:  Family member    Pertinent History  Pt. is a 74 y.o. 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 Pt. PMHx includes: Multiple Falls, Lupus, DJD, Renal Abscess, CVA. Pt. resides with his wife. Pt.'s wife and daughter assist with ADLs. Pt. has caregivers in  for 2 hours a day, 6 days a week. Pt. received Rehab services in acute care, at SNF for STR, and [place] services. Pt. is retired from [employer] for [employer] and [employer].    Kenneth Summers Stated Goals  To regain use of his left UE, and do more for himself.    Currently in Pain?  No/denies      OT TREATMENT    Neuro muscular re-education:  Pt. worked  on using his LUE for reaching, grasping, and flipping [place] style discs. Pt. worked or reaching to the far left, and far right with trunk rotation when flipping 60 discs lined up into 4 rows of 15 discs. Pt. worked on flipping the the disc simulateously with both bilateral hands, Pt. Had difficulty completing. Pt. Worked on using his left hand to stack the discs,a nd place them into the case.  Pt. was more alert this week, and reports feeling better. Pt. required less time to complete the task, and fewer cues were required to incorporate his Left UE during the task pt. Initiated with his LUE without cues, and engaged his LUE during the full task today. Pt. required verbal cues to copy the design in the forward direction, as he created the design patterns backwards. Pt. continues to make steady progress. Pt. continues to work on improving LUE functioning, left sided awareness, trunk control, and processing skills needed for  ADL, and IADL functioning.                        OT Education - 06/12/19 1311    Education Details  Left UE functioning, left sided awareness.    Person(s) Educated  Kenneth Summers    Methods  Explanation;Demonstration    Comprehension  Returned demonstration;Tactile cues required;Verbalized understanding;Need further instruction          OT Long Term Goals - 06/09/19 1407      OT LONG TERM GOAL #1   Title  Pt. will increase left shoulder flexion AROM by 10 degrees to access cabinet/shelf.    Baseline  Shoulder flexion:125. Pt. continues to work on reaching with increasing emphasis on flexing trunk to improve functional reach.    Time  12    Period  Weeks    Status  Partially Met    Target Date  06/30/19      OT LONG TERM GOAL #2   Title  Pt. will donn a shirt with Supervision.    Baseline  Pt. continues to require MinA donning a zip down jacket with consistent cues to initiate the correct direction of the jacket.    Time  12    Period  Weeks    Status  New    Target Date  06/30/19      OT LONG TERM GOAL #4   Title  Pt. will improve left grip strength by 10# to be able to open a jar/container.    Baseline  Pt. continues to progress with grip strength, and continues to work towards opening containers, and bottles.    Time  12    Period  Weeks    Status  On-going    Target Date  06/30/19      OT LONG TERM GOAL #5   Title  Pt. will improve left hand [facility] skills to be able to assist with buttoning/zipping.    Baseline  Pt. is improving with manipulating the zipper on his jacket, however continues to require practice. Pt. continues to work on improving bilateral [facility] skills for buttoning/zipping    Time  12    Period  Weeks    Status  On-going    Target Date  06/30/19      OT LONG TERM GOAL #6   Title  Pt. will demonstrate visual conmpensatory strategies for 100% of the time during ADLs    Baseline  Pt. requires cues for left sides awareness.    Time  12  Period  Weeks    Status  On-going    Target Date  06/30/19      OT LONG TERM GOAL  #9   TITLE  Pt. will  navigate his w/c around obstacles with Supervision and 100% accuracy    Baseline  Pt. continues to require cuing for obstacle on the left, however requires less cues.    Time  12    Period  Weeks    Status  On-going    Target Date  06/30/19      OT LONG TERM GOAL  #11   TITLE  Pt. will increase BUE strength by 3m grades in preparation for ADL transfers.    Baseline  Pt. continues to present with limited BUE strength    Time  12    Period  Weeks    Status  On-going    Target Date  06/30/19            Plan - 06/12/19 1314    Clinical Impression Statement  p    OT Occupational Profile and History  Problem Focused Assessment - Including review of records relating to presenting problem    Occupational performance deficits (Please refer to evaluation for details):  ADL's    Body Structure / Function / Physical Skills  UE functional use;Coordination;FMC;Dexterity;Strength;ROM    Cognitive Skills  Attention;Memory;Emotional;Problem Solve;Safety Awareness    Rehab Potential  Good    Clinical Decision Making  Several treatment options, min-mod task modification necessary    Comorbidities Affecting Occupational Performance:  Presence of comorbidities impacting occupational performance    Modification or Assistance to Complete Evaluation   No modification of tasks or assist necessary to complete eval    OT Frequency  2x / week    OT Duration  12 weeks    OT Treatment/Interventions  Self-care/ADL training;DME and/or AE instruction;Therapeutic exercise;Therapeutic activities;Moist Heat;Cognitive remediation/compensation;Neuromuscular education;Visual/perceptual remediation/compensation;Coping strategies training;Kenneth Summers/family education;Passive range of motion;Psychosocial skills training;Energy conservation;Functional Mobility Training    Consulted and Agree with Plan of Care  Kenneth Summers        Kenneth Summers will benefit from skilled therapeutic intervention in order to improve the following deficits and impairments:   Body Structure / Function / Physical Skills: UE functional use, Coordination, FMC, Dexterity, Strength, ROM Cognitive Skills: Attention, Memory, Emotional, Problem Solve, Safety Awareness     Visit Diagnosis: Muscle weakness (generalized)    Problem List Kenneth Summers Active Problem List   Diagnosis Date Noted  . Sepsis ([place] 10/18/2018    EHarrel Carina5/07/2019, 1:17 PM  [place] [facility]SERV[facility] [address] [place] [address]Phone: [phone]  Fax:  [phone] Name: JSHEMAR PLEMMONSMRN: [id number] of Birth: 108-12-1945

## 2019-06-16 ENCOUNTER — Ambulatory Visit: Payer: Medicare Other | Admitting: Occupational Therapy

## 2019-06-16 ENCOUNTER — Ambulatory Visit: Payer: Medicare Other | Admitting: Physical Therapy

## 2019-06-16 ENCOUNTER — Other Ambulatory Visit: Payer: Self-pay

## 2019-06-16 ENCOUNTER — Encounter: Payer: Self-pay | Admitting: Occupational Therapy

## 2019-06-16 ENCOUNTER — Encounter: Payer: Self-pay | Admitting: Physical Therapy

## 2019-06-16 DIAGNOSIS — M6281 Muscle weakness (generalized): Secondary | ICD-10-CM | POA: Diagnosis not present

## 2019-06-16 DIAGNOSIS — R262 Difficulty in walking, not elsewhere classified: Secondary | ICD-10-CM

## 2019-06-16 DIAGNOSIS — R296 Repeated falls: Secondary | ICD-10-CM

## 2019-06-16 DIAGNOSIS — R278 Other lack of coordination: Secondary | ICD-10-CM

## 2019-06-16 NOTE — Therapy (Signed)
[place] MAIN [facility] SERVICES [address] [place], [place], [address] Phone: [phone]   Fax:  [phone]  Occupational Therapy Treatment  Patient Details  Name: STANLEY LYNESS MRN: [id number] Date of Birth: 01-27-1946 No data recorded  Encounter Date: 06/16/2019  OT End of Session - 06/16/19 1359    Visit Number  112    Number of Visits  157    Date for OT Re-Evaluation  06/30/19    Authorization Type  Progress report period starting 12/26/2018    OT Start Time  1348    OT Stop Time  1430    OT Time Calculation (min)  42 min    Activity Tolerance  Patient tolerated treatment well;Patient limited by fatigue    Behavior During Therapy  [facility] for tasks assessed/performed       Past Medical History:  Diagnosis Date  . Actinic keratosis   . Alcohol abuse   . APS (antiphospholipid syndrome) ([place])   . Basal cell carcinoma   . BPH (benign prostatic hyperplasia)   . CAD (coronary artery disease)   . Cellulitis   . Cervical spinal stenosis   . Chronic osteomyelitis ([place])   . CKD (chronic kidney disease)   . CKD (chronic kidney disease)   . Clostridium difficile diarrhea   . Depression   . Diplopia   . Fatigue   . GERD (gastroesophageal reflux disease)   . Hyperlipemia   . Hypertension   . Hypovitaminosis D   . IBS (irritable bowel syndrome)   . 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 Insomnia   . Left lumbosacral radiculopathy   . Moderate obstructive sleep apnea   . Osteomyelitis of foot ([place])   . PAD (peripheral artery disease) ([place])   . Pruritus   . RA (rheumatoid arthritis) ([place])   . Radiculopathy   . Restless legs syndrome   . RLS (restless legs syndrome)   . SLE (systemic lupus erythematosus related syndrome) ([place])   . 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 Stroke ([place])   . Toxic maculopathy of both eyes     Past Surgical History:  Procedure Laterality Date  . CARDIAC CATHETERIZATION    .  CAROTID ENDARTERECTOMY    . CERVICAL LAMINECTOMY    . CORONARY ANGIOPLASTY    . FRACTURE SURGERY    . fusion C5-6-7    . HERNIA REPAIR    . KNEE ARTHROSCOPY    . TONSILLECTOMY      There were no vitals filed for this visit.  Subjective Assessment - 06/16/19 1354    Subjective   Pt. reports no pain today.    Patient is accompanied by:  Family member    Pertinent History  Pt. is a 74 y.o. 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 Pt. PMHx includes: Multiple Falls, Lupus, DJD, Renal Abscess, CVA. Pt. resides with his wife. Pt.'s wife and daughter assist with ADLs. Pt. has caregivers in  for 2 hours a day, 6 days a week. Pt. received Rehab services in acute care, at SNF for STR, and [facility] services. Pt. is retired from [employer] for [employer] and [employer].    Patient Stated Goals  To regain use of his left UE, and do more for himself.    Currently in Pain?  No/denies       OT TREATMENT    Neuro muscular re-education:  Pt.  worked on using his LUE for reaching, grasping, and flipping [place] style discs. Pt. worked or reaching to the far left, and far right with trunk rotation when flipping 60 discs lined up into 4 rows of 15 discs. Pt. Worked on using his left hand to reach forward at the trunks, remove the discs from the case positioned further in front of the board.  Pt. Continues to make progress, and required fewer cues to incorporate his Left UE throughout the session. Pt. Initiated with his LUE without cues, and engaged his LUE during the full task today. Pt. Utilized strategies to continue engaging his LUE throughout the entire task without cues.  Pt. requiredverbal cues to copy the design in the forward direction, as he created the design patterns backwards. Pt. continues to make steady progress. Pt.required verbal cues for rest breaks. Pt.continues to work on improving LUE functioning, left sidedawareness, trunk control, and processing  skills needed for ADL, and IADL functioning.                     OT Education - 06/16/19 1359    Education Details  Left UE functioning, left sided awareness.    Person(s) Educated  Patient    Methods  Explanation;Demonstration    Comprehension  Returned demonstration;Tactile cues required;Verbalized understanding;Need further instruction          OT Long Term Goals - 06/09/19 1407      OT LONG TERM GOAL #1   Title  Pt. will increase left shoulder flexion AROM by 10 degrees to access cabinet/shelf.    Baseline  Shoulder flexion:125. Pt. continues to work on reaching with increasing emphasis on flexing trunk to improve functional reach.    Time  12    Period  Weeks    Status  Partially Met    Target Date  06/30/19      OT LONG TERM GOAL #2   Title  Pt. will donn a shirt with Supervision.    Baseline  Pt. continues to require MinA donning a zip down jacket with consistent cues to initiate the correct direction of the jacket.    Time  12    Period  Weeks    Status  New    Target Date  06/30/19      OT LONG TERM GOAL #4   Title  Pt. will improve left grip strength by 10# to be able to open a jar/container.    Baseline  Pt. continues to progress with grip strength, and continues to work towards opening containers, and bottles.    Time  12    Period  Weeks    Status  On-going    Target Date  06/30/19      OT LONG TERM GOAL #5   Title  Pt. will improve left hand [facility] skills to be able to assist with buttoning/zipping.    Baseline  Pt. is improving with manipulating the zipper on his jacket, however continues to require practice. Pt. continues to work on improving bilateral [facility] skills for buttoning/zipping    Time  12    Period  Weeks    Status  On-going    Target Date  06/30/19      OT LONG TERM GOAL #6   Title  Pt. will demonstrate visual conmpensatory strategies for 100% of the time during ADLs    Baseline  Pt. requires cues for left sides awareness.     Time  12    Period  Weeks    Status  On-going    Target Date  06/30/19      OT LONG TERM GOAL  #9   TITLE  Pt. will  navigate his w/c around obstacles with Supervision and 100% accuracy    Baseline  Pt. continues to require cuing for obstacle on the left, however requires less cues.    Time  12    Period  Weeks    Status  On-going    Target Date  06/30/19      OT LONG TERM GOAL  #11   TITLE  Pt. will increase BUE strength by 3m grades in preparation for ADL transfers.    Baseline  Pt. continues to present with limited BUE strength    Time  12    Period  Weeks    Status  On-going    Target Date  06/30/19            Plan - 06/16/19 1402    Clinical Impression Statement  Pt. Continues to make progress, and required fewer cues to incorporate his Left UE throughout the session. Pt. Initiated with his LUE without cues, and engaged his LUE during the full task today. Pt. Utilized strategies to continue engaging his LUE throughout the entire task without cues.  Pt. requiredverbal cues to copy the design in the forward direction, as he created the design patterns backwards. Pt. continues to make steady progress. Pt.required verbal cues for rest breaks. Pt.continues to work on improving LUE functioning, left sidedawareness, trunk control, and processing skills needed for ADL, and IADL functioning.     OT Occupational Profile and History  Problem Focused Assessment - Including review of records relating to presenting problem    Occupational performance deficits (Please refer to evaluation for details):  ADL's    Body Structure / Function / Physical Skills  UE functional use;Coordination;FMC;Dexterity;Strength;ROM    Cognitive Skills  Attention;Memory;Emotional;Problem Solve;Safety Awareness    Rehab Potential  Good    Clinical Decision Making  Several treatment options, min-mod task modification necessary    Comorbidities Affecting Occupational Performance:  Presence of comorbidities  impacting occupational performance    Comorbidities impacting occupational performance description:  Phyical, cognitive, visual,  medical comorbidities    Modification or Assistance to Complete Evaluation   No modification of tasks or assist necessary to complete eval    OT Frequency  2x / week    OT Duration  12 weeks    OT Treatment/Interventions  Self-care/ADL training;DME and/or AE instruction;Therapeutic exercise;Therapeutic activities;Moist Heat;Cognitive remediation/compensation;Neuromuscular education;Visual/perceptual remediation/compensation;Coping strategies training;Patient/family education;Passive range of motion;Psychosocial skills training;Energy conservation;Functional Mobility Training    Consulted and Agree with Plan of Care  Patient       Patient will benefit from skilled therapeutic intervention in order to improve the following deficits and impairments:   Body Structure / Function / Physical Skills: UE functional use, Coordination, FMC, Dexterity, Strength, ROM Cognitive Skills: Attention, Memory, Emotional, Problem Solve, Safety Awareness     Visit Diagnosis: Muscle weakness (generalized)  Other lack of coordination    Problem List Patient Active Problem List   Diagnosis Date Noted  . Sepsis ([place] 10/18/2018    EHarrel Carina MS, OTR/L 06/16/2019, 2:06 PM  [place] [facility] [address] [address]Phone: [phone]  Fax:  [phone] Name: JNATALIO SALOISMRN: [id number] of Birth: [birth year]

## 2019-06-16 NOTE — Therapy (Signed)
[place] MAIN [facility] SERVICES [address] [place], [place], [address] Phone: [phone]   Fax:  [phone]  Physical Therapy Treatment  Patient Details  Name: Kenneth Summers MRN: [id number] Date of Birth: 30-Apr-1945 Referring Provider (PT): Dr. [name], Guy Begin   Encounter Date: 06/16/2019  PT End of Session - 06/16/19 1530    Visit Number  135    Number of Visits  180    Date for PT Re-Evaluation  08/20/19    Authorization Type  Medicare reporting period starting 09/11/18    Authorization Time Period  goals last assessed 05/26/19    PT Start Time  1302    PT Stop Time  1345    PT Time Calculation (min)  43 min    Equipment Utilized During Treatment  Gait belt    Activity Tolerance  Patient tolerated treatment well;No increased pain    Behavior During Therapy  WFL for tasks assessed/performed       Past Medical History:  Diagnosis Date  . Actinic keratosis   . Alcohol abuse   . APS (antiphospholipid syndrome) ([place])   . Basal cell carcinoma   . BPH (benign prostatic hyperplasia)   . CAD (coronary artery disease)   . Cellulitis   . Cervical spinal stenosis   . Chronic osteomyelitis ([place])   . CKD (chronic kidney disease)   . CKD (chronic kidney disease)   . Clostridium difficile diarrhea   . Depression   . Diplopia   . Fatigue   . GERD (gastroesophageal reflux disease)   . Hyperlipemia   . Hypertension   . Hypovitaminosis D   . IBS (irritable bowel syndrome)   . IgA deficiency ([place])   . Insomnia   . Left lumbosacral radiculopathy   . Moderate obstructive sleep apnea   . Osteomyelitis of foot ([place])   . PAD (peripheral artery disease) ([place])   . Pruritus   . RA (rheumatoid arthritis) ([place])   . Radiculopathy   . Restless legs syndrome   . RLS (restless legs syndrome)   . SLE (systemic lupus erythematosus related syndrome) ([place])   . 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 Stroke  ([place])   . Toxic maculopathy of both eyes     Past Surgical History:  Procedure Laterality Date  . CARDIAC CATHETERIZATION    . CAROTID ENDARTERECTOMY    . CERVICAL LAMINECTOMY    . CORONARY ANGIOPLASTY    . FRACTURE SURGERY    . fusion C5-6-7    . HERNIA REPAIR    . KNEE ARTHROSCOPY    . TONSILLECTOMY      There were no vitals filed for this visit.  Subjective Assessment - 06/16/19 1527    Subjective  Patient reports doing well; no new falls. reports minimal fatigue.    Patient is accompained by:  Family member    Pertinent History  Patient is a 74 y.o. male who presents to outpatient physical therapy with a referral for medical diagnosis of CVA. This patient's chief complaints consist of left hemiplegia and overall deconditioning leading to the following functional deficits: dependent for ADLs, IADLs, unable to transfer to car, dependent transfers at home with hoyer lift, unable to walk or stand without significant assistance, difficulty with W/C navigation..    Limitations  Sitting;Lifting;Standing;House hold activities;Writing;Walking;Other (comment)    Currently in Pain?  No/denies          TREATMENT: Patient presents  to therapy with power chair Adjusted chair to 27 inch height from floor to seat height  Instructed patient in sit<>stand transfers from power chair using RW  Patient required min VCs for proper hand placement, weight shift and positioning for optimal transfer ability; Initially he is able to exhibit good forward lean but during mid transfer often pushes backwards in trunk requiring min A for trunk control and positioning  Patient able to complete 7 transfers with min A with cues for weight shift and proper positioning  In standing: Educated pt in neutral weight shift with cues for erect posture and increased hip extension;   Instructed patient in side/Side weight shifts x7 reps with cues for erect posture and increased quad activation; Instructed  patient in forward/backward weight shift x5 reps with cues for erect posture and upper trunk control;     Patient tolerated session fair. He was heavily fatigued this session requiring frequent seated rest breaks. Patient requires min A for repositioning in chair having difficulty scooting posteriorly;                      PT Education - 06/16/19 1529    Education Details  transfers/weight shift, HEP    Person(s) Educated  Patient;Child(ren)    Methods  Explanation;Verbal cues    Comprehension  Verbalized understanding;Returned demonstration;Verbal cues required;Need further instruction       PT Short Term Goals - 05/28/19 0717      PT SHORT TERM GOAL #1   Title  Be independent with initial home exercise program for self-management of symptoms.    Baseline  HEP to be given at second session; advice to practice unsupported sitting at home (06/19/2018); 07/08/18: Performing at home, 6/29: needs assistance but is doing exercise program regularly; 08/22/18: adherent;    Time  2    Period  Weeks    Status  Achieved    Target Date  06/26/18        PT Long Term Goals - 05/28/19 0717      PT LONG TERM GOAL #1   Title  Patient will complete all bed mobility with min A to improve functional independence for getting in and out of bed and adjusting in bed.     Baseline  max A - total A reported by family (06/19/2018); 07/08/18: still requires maxA, dtr reports improvement in rolling since starting therapy; 6/23: min A to supervision in clinic; not doing at home right; 8/5: Pt's daughter states that his rolling has improved, but still has difficulty with all other bed mobility; 10/23/18: pts daughter states that she is doing about 35% of the work if he is in proper position, he does uses the bed rails to help roll;  using Hoyer for coming to sit EOB; 10/12: pts daughter states that she is doing about 30% of the work if he is in proper position, he does uses the bed rails to help roll,  but it is improving;  using Hoyer for coming to sit EOB, 10/29: min A for sit to sidelying, rolling supervision, able to transition sidelying to sit with HHA;    Time  12    Period  Weeks    Status  Achieved    Target Date  12/04/18      PT LONG TERM GOAL #2   Title  Patient will complete sit <> stand transfer chair to chair with Min A and LRAD to improve functional independence for household and community mobility.  Baseline  supervision for STS, still requires elevated surface height to 27"    Time  12    Period  Weeks    Status  Achieved    Target Date  05/23/19      PT LONG TERM GOAL #3   Title  Patient will navigate power w/c with min A x 100 feet to improve mobility for household and short community distances.    Baseline  required total A (06/12/2018); able to roll 20 feet with min A (06/20/2018); 07/08/18: Pt can go approximately 10' without assist per family;  10/23/2018: pt's daughter states that his power WC mobility has become 100% better with the new hand control, slowed speed, and with practice; 10/12: pt's daughter reports that he can perform household WC mobility and limited community ambulation with minA    Time  12    Period  Weeks    Status  Achieved    Target Date  05/23/19      PT LONG TERM GOAL #4   Title  Patient will complete car transfers W/C to family SUV with min A using LRAD to improve his abilty to participate in community activities.     Baseline  unable to complete car transfers at all (06/12/2018); unable to complete car transfers at all (06/19/2018).; 07/08/18: Unable to perform at this time; 09/11/18: unable to perform at this time; 10/23/2018: unable to complete car transfers at all; 10/12:  unable to complete car transfers at all, 10/29: unable; 01/13/19: unable; 2/11: min-mod A +2, 3/10: min A+1    Time  12    Period  Weeks    Status  Achieved    Target Date  05/23/19      PT LONG TERM GOAL #5   Title  Patient will ambulate at least 10 feet with min A using  LRAD to improve mobility for household and community distances.    Baseline  unable to take steps (06/12/2018); able to shift R leg forward and back with max A and RW at edge of plinth (06/19/2018); 07/08/18: unable to ambulate at this time. 09/11/18: unable to perform at this time; 10/23/2018: unable to perform at this time; 10/12: unable to perform at this time, 10/29: unable at this time; 01/13/19: unable at this time, 2/11: min A to step with RLE, unable to step with LLE, 3/10: able to take 2 steps in parallel bars with min A for safety; 4/1: min A for walking 10 feet in parallel bars    Time  12    Period  Weeks    Status  Partially Met    Target Date  08/20/19      Additional Long Term Goals   Additional Long Term Goals  Yes      PT LONG TERM GOAL #6   Title  Patient will be able to safety navigate up/down ramp with power chair, exhibiting good safety awareness, mod I to safely enter/exit his home.    Baseline  2/11: supervision with intermittent min A;4/1: supervision;    Time  12    Period  Weeks    Status  Partially Met    Target Date  08/20/19      PT LONG TERM GOAL #7   Title  Patient will increase functional reach test to >15 inches in sitting to exhibit improved sitting balance and positioning and reduce fall risk;    Baseline  07/30/18: 12 inches; 09/11/18: 10-12 inches; 10/23/18: 12-13 inch; 10/12: 14.5", 10/29: 15.5  inch    Time  4    Period  Weeks    Status  Achieved    Target Date  05/23/19      PT LONG TERM GOAL #8   Title  Patient will increase LLE quad strength to 3+/5 to improve functional strength for standing and mobility;    Baseline  10/28: 2+/5; 01/13/19: 2+/5, 2/11: 3-/5, 04/16/19: 3-/5, 05/08/19: 3-/5    Time  12    Period  Weeks    Status  Partially Met    Target Date  08/20/19      PT LONG TERM GOAL  #9   TITLE  Patient will report no vertigo with provoking motions or positions in order to be able to perform ADLs and mobility tasks without symptoms.    Baseline   reports severe dizziness with room spinning causing nausea and vomitting;    Time  12    Period  Weeks    Status  New    Target Date  08/20/19            Plan - 06/16/19 1530    Clinical Impression Statement  Patient motivated and participated well within session. Instructed patient in transfers from power chair with RW to challenge stance control/balance. He does require min A for sit<>stand transfers and requires cues for forward weight shift for neutral stance. patient does fatigue quickly requiring short seated rest breaks. He also has difficulty correcting balance with poor hip/knee and ankle strategies. Patient would benefit from additional skilled PT intervention to improve strength, balance and mobility;    Personal Factors and Comorbidities  Age;Comorbidity 3+    Comorbidities  Relevant past medical history and comorbidities include long term steroid use for lupus, CKD, chronic osteomyelitis, cervical spine stenosis, BPH, APS, alcohol abuse, peripheral artery disease, depression, GERD, obstructive sleep apnea, HTN, IBS, IgA deficiency, lumbar radiculopathy, RA, Stroke, toxic maculopathy in both eyes, systemic lupus erythematosus related syndrome, cardiac catheterization, carotid endarterectomy, cervical laminectomy, coronary angioplasty, Fusion C5-C7, knee arthroplasty, lumbar surgery.    Examination-Activity Limitations  Bed Mobility;Bend;Sit;Toileting;Stand;Stairs;Lift;Transfers;Squat;Locomotion Level;Carry;Dressing;Hygiene/Grooming;Continence    Examination-Participation Restrictions  Yard Work;Interpersonal Relationship;Community Activity    Stability/Clinical Decision Making  Evolving/Moderate complexity    Rehab Potential  Fair    PT Frequency  3x / week    PT Duration  12 weeks    PT Treatment/Interventions  ADLs/Self Care Home Management;Electrical Stimulation;Moist Heat;Cryotherapy;Gait training;Stair training;Functional mobility training;Therapeutic exercise;Balance  training;Neuromuscular re-education;Cognitive remediation;Patient/family education;Orthotic Fit/Training;Wheelchair mobility training;Manual techniques;Passive range of motion;Energy conservation;Joint Manipulations;Canalith Repostioning;Therapeutic activities;Vestibular    PT Next Visit Plan  Work on strength, sitting/standing balance, functional activities such as rolling, bed mobility, transfers; caregiver training    PT Home Exercise Plan  no updates this session    Consulted and Agree with Plan of Care  Patient;Family member/caregiver    Family Member Consulted  Wife       Patient will benefit from skilled therapeutic intervention in order to improve the following deficits and impairments:  Abnormal gait, Decreased activity tolerance, Decreased cognition, Decreased endurance, Decreased knowledge of use of DME, Decreased range of motion, Decreased skin integrity, Decreased strength, Impaired perceived functional ability, Impaired sensation, Impaired UE functional use, Improper body mechanics, Pain, Cardiopulmonary status limiting activity, Decreased balance, Decreased coordination, Decreased mobility, Difficulty walking, Impaired tone, Postural dysfunction, Dizziness  Visit Diagnosis: Muscle weakness (generalized)  Other lack of coordination  Difficulty in walking, not elsewhere classified  Repeated falls     Problem List Patient Active Problem List  Diagnosis Date Noted  . Sepsis ([place]) 10/18/2018    Jacquette Canales PT, DPT 06/16/2019, 3:32 PM  [place] MAIN [facility] SERVICES [address] [place], [place], [address] Phone: [phone]   Fax:  [phone]  Name: Kenneth Summers MRN: [id number] Date of Birth: [birth year]

## 2019-06-18 ENCOUNTER — Other Ambulatory Visit: Payer: Self-pay

## 2019-06-18 ENCOUNTER — Encounter: Payer: Self-pay | Admitting: Physical Therapy

## 2019-06-18 ENCOUNTER — Ambulatory Visit: Payer: Medicare Other | Admitting: Physical Therapy

## 2019-06-18 DIAGNOSIS — R278 Other lack of coordination: Secondary | ICD-10-CM

## 2019-06-18 DIAGNOSIS — M6281 Muscle weakness (generalized): Secondary | ICD-10-CM | POA: Diagnosis not present

## 2019-06-18 DIAGNOSIS — R296 Repeated falls: Secondary | ICD-10-CM

## 2019-06-18 DIAGNOSIS — R262 Difficulty in walking, not elsewhere classified: Secondary | ICD-10-CM

## 2019-06-18 NOTE — Therapy (Signed)
[place] MAIN [facility] SERVICES [address] [place], [place], [address] Phone: [phone]   Fax:  [phone]  Physical Therapy Treatment  Patient Details  Name: Kenneth Summers MRN: [id number] Date of Birth: 01-13-1946 Referring Provider (PT): Dr. [name], Guy Begin   Encounter Date: 06/18/2019  PT End of Session - 06/18/19 1259    Visit Number  [id number]    Number of Visits  180    Date for PT Re-Evaluation  08/20/19    Authorization Type  Medicare reporting period starting 09/11/18    Authorization Time Period  goals last assessed 05/26/19    PT Start Time  1301    PT Stop Time  1345    PT Time Calculation (min)  44 min    Equipment Utilized During Treatment  Gait belt    Activity Tolerance  Patient tolerated treatment well;No increased pain    Behavior During Therapy  WFL for tasks assessed/performed       Past Medical History:  Diagnosis Date  . Actinic keratosis   . Alcohol abuse   . APS (antiphospholipid syndrome) ([place])   . Basal cell carcinoma   . BPH (benign prostatic hyperplasia)   . CAD (coronary artery disease)   . Cellulitis   . Cervical spinal stenosis   . Chronic osteomyelitis ([place])   . CKD (chronic kidney disease)   . CKD (chronic kidney disease)   . Clostridium difficile diarrhea   . Depression   . Diplopia   . Fatigue   . GERD (gastroesophageal reflux disease)   . Hyperlipemia   . Hypertension   . Hypovitaminosis D   . IBS (irritable bowel syndrome)   . 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 Insomnia   . Left lumbosacral radiculopathy   . Moderate obstructive sleep apnea   . Osteomyelitis of foot ([place])   . PAD (peripheral artery disease) ([place])   . Pruritus   . RA (rheumatoid arthritis) ([place])   . Radiculopathy   . Restless legs syndrome   . RLS (restless legs syndrome)   . SLE (systemic lupus erythematosus related syndrome) ([place])   . 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 Stroke  ([place])   . Toxic maculopathy of both eyes     Past Surgical History:  Procedure Laterality Date  . CARDIAC CATHETERIZATION    . CAROTID ENDARTERECTOMY    . CERVICAL LAMINECTOMY    . CORONARY ANGIOPLASTY    . FRACTURE SURGERY    . fusion C5-6-7    . HERNIA REPAIR    . KNEE ARTHROSCOPY    . TONSILLECTOMY      There were no vitals filed for this visit.  Subjective Assessment - 06/18/19 1439    Subjective  Patient reports doing well; reports mild discomfort in hand this morning but reports no pain currently;    Patient is accompained by:  Family member    Pertinent History  Patient is a 74 y.o. male who presents to outpatient physical therapy with a referral for medical diagnosis of CVA. This patient's chief complaints consist of left hemiplegia and overall deconditioning leading to the following functional deficits: dependent for ADLs, IADLs, unable to transfer to car, dependent transfers at home with hoyer lift, unable to walk or stand without significant assistance, difficulty with W/C navigation..    Limitations  Sitting;Lifting;Standing;House hold activities;Writing;Walking;Other (comment)    Currently in Pain?  No/denies    Multiple Pain Sites  No              TREATMENT:  Prior to transfersand in between transfers: Patient sittingin wheelchair: LLE passive hamstring stretch 30 sec hold x1 rep  Instructed patient in LE strengthening exercise, applied 2# ankle weight for better strengthening: -hip flexion marchx10 reps bilaterally -LAQ x15 reps each LE; Ptrequires verbal and visual cues to improve ROM;He was able to exhibit better trunk control but does have difficulty increasing LLE AROM this session;  Following exercise: Transferred sit<>Stand in parallel bars (seat adjusted to 27 inch from floor height) x5reps throughout session;He was able to transfer close supervisionwithminVCs for forward weight shift for better trunk control and transfer  ability;  Standing in parallel bars: Standing,CGA to close supervision with min VCs for weight shift and increased extension for better stance control; -weight shift side/side x10 reps with min A and min VCs for erect posture and increased LLE knee extension in stance;  -Mini squat with B rail assist x15reps with cues to increase knee extension in BLE between repetitions; Patient able to exhibit good quad activation in each LE with squats;  -weight shift to neutral, with cues for hip extension and to shift to neutral without heavy right lateral lean             Progressed to reducing rail assist from 2-1-0 intermittent rail assist x15-20 sec x2 sets to facilitate increased weight bearing in BLE and to improve LE weight acceptance.   Patient initially exhibits flexed posture with decreased hip extension; He was able to exhibit improved hip extension during subsequent transfers; Although he continues to require min A for safety and trunk control;  Patient required minA to scoot back in chair at end of session due to increased fatigue  Response to treatment: [place].He was able to exhibit better weight shift during transfers with better LE/trunk positioning. Patient does fatigue quickly being unable to stand for prolonged period of time. He does require visual cues for LE exercise to increase AROM. Patient repots moderate fatigue at end of session;                       PT Education - 06/18/19 1259    Education Details  transfers/weight shift, HEP    Person(s) Educated  Patient    Methods  Explanation;Verbal cues    Comprehension  Verbalized understanding;Returned demonstration;Verbal cues required;Need further instruction       PT Short Term Goals - 05/28/19 0717      PT SHORT TERM GOAL #1   Title  Be independent with initial home exercise program for self-management of symptoms.    Baseline  HEP to be given at second session; advice to practice  unsupported sitting at home (06/19/2018); 07/08/18: Performing at home, 6/29: needs assistance but is doing exercise program regularly; 08/22/18: adherent;    Time  2    Period  Weeks    Status  Achieved    Target Date  06/26/18        PT Long Term Goals - 05/28/19 0717      PT LONG TERM GOAL #1   Title  Patient will complete all bed mobility with min A to improve functional independence for getting in and out of bed and adjusting in bed.     Baseline  max A - total A reported by family (06/19/2018); 07/08/18: still requires maxA, dtr reports improvement in rolling since starting therapy; 6/23: min A to supervision in clinic; not  doing at home right; 8/5: Pt's daughter states that his rolling has improved, but still has difficulty with all other bed mobility; 10/23/18: pts daughter states that she is doing about 35% of the work if he is in proper position, he does uses the bed rails to help roll;  using Harrel Lemon for coming to sit EOB; 10/12: pts daughter states that she is doing about 30% of the work if he is in proper position, he does uses the bed rails to help roll, but it is improving;  using Hoyer for coming to sit EOB, 10/29: min A for sit to sidelying, rolling supervision, able to transition sidelying to sit with HHA;    Time  12    Period  Weeks    Status  Achieved    Target Date  12/04/18      PT LONG TERM GOAL #2   Title  Patient will complete sit <> stand transfer chair to chair with Min A and LRAD to improve functional independence for household and community mobility.     Baseline  supervision for STS, still requires elevated surface height to 27"    Time  12    Period  Weeks    Status  Achieved    Target Date  05/23/19      PT LONG TERM GOAL #3   Title  Patient will navigate power w/c with min A x 100 feet to improve mobility for household and short community distances.    Baseline  required total A (06/12/2018); able to roll 20 feet with min A (06/20/2018); 07/08/18: Pt can go  approximately 10' without assist per family;  10/23/2018: pt's daughter states that his power WC mobility has become 100% better with the new hand control, slowed speed, and with practice; 10/12: pt's daughter reports that he can perform household WC mobility and limited community ambulation with minA    Time  12    Period  Weeks    Status  Achieved    Target Date  05/23/19      PT LONG TERM GOAL #4   Title  Patient will complete car transfers W/C to family SUV with min A using LRAD to improve his abilty to participate in community activities.     Baseline  unable to complete car transfers at all (06/12/2018); unable to complete car transfers at all (06/19/2018).; 07/08/18: Unable to perform at this time; 09/11/18: unable to perform at this time; 10/23/2018: unable to complete car transfers at all; 10/12:  unable to complete car transfers at all, 10/29: unable; 01/13/19: unable; 2/11: min-mod A +2, 3/10: min A+1    Time  12    Period  Weeks    Status  Achieved    Target Date  05/23/19      PT LONG TERM GOAL #5   Title  Patient will ambulate at least 10 feet with min A using LRAD to improve mobility for household and community distances.    Baseline  unable to take steps (06/12/2018); able to shift R leg forward and back with max A and RW at edge of plinth (06/19/2018); 07/08/18: unable to ambulate at this time. 09/11/18: unable to perform at this time; 10/23/2018: unable to perform at this time; 10/12: unable to perform at this time, 10/29: unable at this time; 01/13/19: unable at this time, 2/11: min A to step with RLE, unable to step with LLE, 3/10: able to take 2 steps in parallel bars with min A for safety;  4/1: min A for walking 10 feet in parallel bars    Time  12    Period  Weeks    Status  Partially Met    Target Date  08/20/19      Additional Long Term Goals   Additional Long Term Goals  Yes      PT LONG TERM GOAL #6   Title  Patient will be able to safety navigate up/down ramp with power chair,  exhibiting good safety awareness, mod I to safely enter/exit his home.    Baseline  2/11: supervision with intermittent min A;4/1: supervision;    Time  12    Period  Weeks    Status  Partially Met    Target Date  08/20/19      PT LONG TERM GOAL #7   Title  Patient will increase functional reach test to >15 inches in sitting to exhibit improved sitting balance and positioning and reduce fall risk;    Baseline  07/30/18: 12 inches; 09/11/18: 10-12 inches; 10/23/18: 12-13 inch; 10/12: 14.5", 10/29: 15.5 inch    Time  4    Period  Weeks    Status  Achieved    Target Date  05/23/19      PT LONG TERM GOAL #8   Title  Patient will increase LLE quad strength to 3+/5 to improve functional strength for standing and mobility;    Baseline  10/28: 2+/5; 01/13/19: 2+/5, 2/11: 3-/5, 04/16/19: 3-/5, 05/08/19: 3-/5    Time  12    Period  Weeks    Status  Partially Met    Target Date  08/20/19      PT LONG TERM GOAL  #9   TITLE  Patient will report no vertigo with provoking motions or positions in order to be able to perform ADLs and mobility tasks without symptoms.    Baseline  reports severe dizziness with room spinning causing nausea and vomitting;    Time  12    Period  Weeks    Status  New    Target Date  08/20/19            Plan - 06/18/19 1319    Clinical Impression Statement  Patient motivated and participated well within session. He was instructed in sit<>Stand transfers in parallel bars. Patient is able to exhibit better forward weight shift progressing to supervision with transfers in parallel bars. he does require elevated seat height for better transfer ability. Instructed patient in standing with weight shift to neutral working on reducing rail assist on parallel bars. Patient does fatigue requiring short seated rest breaks. He would benefit from additional skilled PT intervention to improve strength, balance and mobility;    Personal Factors and Comorbidities  Age;Comorbidity 3+     Comorbidities  Relevant past medical history and comorbidities include long term steroid use for lupus, CKD, chronic osteomyelitis, cervical spine stenosis, BPH, APS, alcohol abuse, peripheral artery disease, depression, GERD, obstructive sleep apnea, HTN, IBS, IgA deficiency, lumbar radiculopathy, RA, Stroke, toxic maculopathy in both eyes, systemic lupus erythematosus related syndrome, cardiac catheterization, carotid endarterectomy, cervical laminectomy, coronary angioplasty, Fusion C5-C7, knee arthroplasty, lumbar surgery.    Examination-Activity Limitations  Bed Mobility;Bend;Sit;Toileting;Stand;Stairs;Lift;Transfers;Squat;Locomotion Level;Carry;Dressing;Hygiene/Grooming;Continence    Examination-Participation Restrictions  Yard Work;Interpersonal Relationship;Community Activity    Stability/Clinical Decision Making  Evolving/Moderate complexity    Rehab Potential  Fair    PT Frequency  3x / week    PT Duration  12 weeks    PT Treatment/Interventions  ADLs/Self  Care Home Management;Electrical Stimulation;Moist Heat;Cryotherapy;Gait training;Stair training;Functional mobility training;Therapeutic exercise;Balance training;Neuromuscular re-education;Cognitive remediation;Patient/family education;Orthotic Fit/Training;Wheelchair mobility training;Manual techniques;Passive range of motion;Energy conservation;Joint Manipulations;Canalith Repostioning;Therapeutic activities;Vestibular    PT Next Visit Plan  Work on strength, sitting/standing balance, functional activities such as rolling, bed mobility, transfers; caregiver training    PT Home Exercise Plan  no updates this session    Consulted and Agree with Plan of Care  Patient;Family member/caregiver    Family Member Consulted  Wife       Patient will benefit from skilled therapeutic intervention in order to improve the following deficits and impairments:  Abnormal gait, Decreased activity tolerance, Decreased cognition, Decreased endurance,  Decreased knowledge of use of DME, Decreased range of motion, Decreased skin integrity, Decreased strength, Impaired perceived functional ability, Impaired sensation, Impaired UE functional use, Improper body mechanics, Pain, Cardiopulmonary status limiting activity, Decreased balance, Decreased coordination, Decreased mobility, Difficulty walking, Impaired tone, Postural dysfunction, Dizziness  Visit Diagnosis: Muscle weakness (generalized)  Other lack of coordination  Difficulty in walking, not elsewhere classified  Repeated falls     Problem List Patient Active Problem List   Diagnosis Date Noted  . Sepsis ([place]) 10/18/2018    Anniemae Haberkorn PT, DPT 06/18/2019, 2:41 PM  [place] MAIN [facility] SERVICES [address] [place], [place], [address] Phone: [phone]   Fax:  [phone]  Name: KIRKLAND FIGG MRN: [id number] Date of Birth: Feb 19, 1945

## 2019-06-19 ENCOUNTER — Ambulatory Visit: Payer: Medicare Other | Admitting: Occupational Therapy

## 2019-06-19 ENCOUNTER — Ambulatory Visit: Payer: Medicare Other | Admitting: Physical Therapy

## 2019-06-23 ENCOUNTER — Encounter: Payer: Self-pay | Admitting: Occupational Therapy

## 2019-06-23 ENCOUNTER — Encounter: Payer: Self-pay | Admitting: Physical Therapy

## 2019-06-23 ENCOUNTER — Ambulatory Visit: Payer: Medicare Other | Admitting: Occupational Therapy

## 2019-06-23 ENCOUNTER — Other Ambulatory Visit: Payer: Self-pay

## 2019-06-23 ENCOUNTER — Ambulatory Visit: Payer: Medicare Other | Admitting: Physical Therapy

## 2019-06-23 DIAGNOSIS — M6281 Muscle weakness (generalized): Secondary | ICD-10-CM

## 2019-06-23 DIAGNOSIS — R262 Difficulty in walking, not elsewhere classified: Secondary | ICD-10-CM

## 2019-06-23 DIAGNOSIS — R278 Other lack of coordination: Secondary | ICD-10-CM

## 2019-06-23 DIAGNOSIS — R296 Repeated falls: Secondary | ICD-10-CM

## 2019-06-23 NOTE — Therapy (Signed)
[place] MAIN [facility] SERVICES [address] [place], [place], [address] Phone: [phone]   Fax:  [phone]  Occupational Therapy Treatment/Recertification  Patient Details  Name: Kenneth Summers MRN: [id number] Date of Birth: 1945-05-24 No data recorded  Encounter Date: 06/23/2019  OT End of Session - 06/23/19 1352    Visit Number  113    Number of Visits  157    Date for OT Re-Evaluation  09/15/19    OT Start Time  1350    OT Stop Time  1430    OT Time Calculation (min)  40 min    Activity Tolerance  Patient tolerated treatment well;Patient limited by fatigue    Behavior During Therapy  [facility] for tasks assessed/performed       Past Medical History:  Diagnosis Date  . Actinic keratosis   . Alcohol abuse   . APS (antiphospholipid syndrome) ([place])   . Basal cell carcinoma   . BPH (benign prostatic hyperplasia)   . CAD (coronary artery disease)   . Cellulitis   . Cervical spinal stenosis   . Chronic osteomyelitis ([place])   . CKD (chronic kidney disease)   . CKD (chronic kidney disease)   . Clostridium difficile diarrhea   . Depression   . Diplopia   . Fatigue   . GERD (gastroesophageal reflux disease)   . Hyperlipemia   . Hypertension   . Hypovitaminosis D   . IBS (irritable bowel syndrome)   . 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 Insomnia   . Left lumbosacral radiculopathy   . Moderate obstructive sleep apnea   . Osteomyelitis of foot ([place])   . PAD (peripheral artery disease) ([place])   . Pruritus   . RA (rheumatoid arthritis) ([place])   . Radiculopathy   . Restless legs syndrome   . RLS (restless legs syndrome)   . SLE (systemic lupus erythematosus related syndrome) ([place])   . 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 Stroke ([place])   . Toxic maculopathy of both eyes     Past Surgical History:  Procedure Laterality Date  . CARDIAC CATHETERIZATION    . CAROTID ENDARTERECTOMY    . CERVICAL LAMINECTOMY     . CORONARY ANGIOPLASTY    . FRACTURE SURGERY    . fusion C5-6-7    . HERNIA REPAIR    . KNEE ARTHROSCOPY    . TONSILLECTOMY      There were no vitals filed for this visit.  Subjective Assessment - 06/23/19 1352    Subjective   Pt. reports no pain today.    Patient is accompanied by:  Family member    Pertinent History  Pt. is a 74 y.o. 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 Pt. PMHx includes: Multiple Falls, Lupus, DJD, Renal Abscess, CVA. Pt. resides with his wife. Pt.'s wife and daughter assist with ADLs. Pt. has caregivers in  for 2 hours a day, 6 days a week. Pt. received Rehab services in acute care, at SNF for STR, and [place] services. Pt. is retired from [employer] for [employer] and [employer].         [facility] OT Assessment - 06/23/19 0001      Coordination   Left 9 Hole Peg Test  4 min. & 21 sec.      AROM   Overall AROM Comments  Left shoulder flexion 130, abduction 96, elbow ,5-142 wrist  extension 52      Strength   Overall Strength Comments  LUE strength [place] flexion 3+/5, abduction 3/5, elbow flexion 4/5 wrist extension 4+/5       Hand Function   Left Hand Grip (lbs)  20    Left Hand Lateral Pinch  7 lbs    Left 3 point pinch  9 lbs      Measurements were obtained, and goals were reviewed with the pt. Pt. has made steady progress with Left shoulder AROM in flexion, and abduction. Pt. has improved with strength in her left elbow flexion, extension, wrist extension, and pinch strength. Pt. has improved with functional reaching with the LUE to access cabinetry, and requires less cues for left sided awareness, and engaging his LUE, and hand during ADLs, and IADL tasks. Pt. Has edema in his left hand, and reports that it was painful over the weekend. Pt. Has no reports of pain during reassessment, however has edema in the hand, and presents with a decrease in left grip strength, and has experienced a significant decrease in  [facility] scores today. Pt. Continues to benefit from skilled OT services to work on improving Left sided awareness, LUE strength, motor control, and [facility] skills in preparation for improving with overall  ADL, and IADL functioning.                    OT Education - 06/23/19 1352    Education Details  Left UE functioning, left sided awareness.    Person(s) Educated  Patient    Methods  Explanation;Demonstration    Comprehension  Returned demonstration;Tactile cues required;Verbalized understanding;Need further instruction          OT Long Term Goals - 06/23/19 1414      OT LONG TERM GOAL #1   Title  Pt. will increase left shoulder flexion AROM by 10 degrees to access cabinet/shelf.    Baseline  Shoulder flexion:130. Pt. is progressing, and continues to work on reaching with increasing emphasis on flexing trunk to improve functional reach.    Time  12    Period  Weeks    Status  Partially Met    Target Date  09/15/19      OT LONG TERM GOAL #2   Title  Pt. will donn a shirt with Supervision.    Baseline  Pt. continues to require MinA donning a zip down jacket with consistent cues to initiate the correct direction of the jacket.    Time  12    Status  On-going    Target Date  09/15/19      OT LONG TERM GOAL #4   Title  Pt. will improve left grip strength by 10# to be able to open a jar/container.    Baseline  Pt. continues to work towards opening containers, and bottles.    Time  12    Period  Weeks    Status  On-going    Target Date  09/15/19      OT LONG TERM GOAL #5   Title  Pt. will improve left hand [facility] skills to be able to assist with buttoning/zipping.    Baseline  Pt. is improving with manipulating the zipper on his jacket, however continues to require practice. Pt. continues to work on improving bilateral [facility] skills for buttoning/zipping    Time  12    Period  Weeks    Status  On-going    Target Date  09/15/19  OT LONG TERM GOAL #6   Title  Pt. will  demonstrate visual conmpensatory strategies for 100% of the time during ADLs    Baseline  Pt. requires  fewer cues for left sides awareness.    Time  12    Period  Weeks    Status  Partially Met    Target Date  09/15/19      OT LONG TERM GOAL #8   Title  Pt. will accurately identify potential safety hazard using good safety awareness, and judgement 100% for ADLs, and IADLs.    Baseline  Pt. requires cues    Time  12    Period  Weeks    Status  New    Target Date  09/15/19      OT LONG TERM GOAL  #9   TITLE  Pt. will  navigate his w/c around obstacles with Supervision and 100% accuracy    Baseline  Pt. continues to require cuing for obstacle on the left, however requires less cues.    Time  12    Period  Weeks    Status  On-going    Target Date  09/15/19      OT LONG TERM GOAL  #11   TITLE  Pt. will increase BUE strength by 71m grades in preparation for ADL transfers.    Baseline  Pt. is improving with LUE strength    Time  12    Period  Weeks    Status  On-going    Target Date  09/15/19      OT LONG TERM GOAL  #12   TITLE  Pt. will improve LUE functional reaching with the left engaging the trunk 100% of the time during ADL tasks with minimal cues.    Baseline  06/23/2019: Pt. is improving with LUE functional reachin, howwver requires cues to engage trunk for extended reaching.    Time  12    Period  Weeks    Status  On-going    Target Date  09/15/19            Plan - 06/23/19 1354    Clinical Impression Statement  Measurements were obtained, and goals were reviewed with the pt. Pt. has made steady progress with Left shoulder AROM in flexion, and abduction. Pt. has improved with strength in her left elbow flexion, extension, wrist extension, and pinch strength. Pt. has improved with functional reaching with the LUE to access cabinetry, and requires less cues for left sided awareness, and engaging his LUE, and hand during ADLs, and IADL tasks. Pt. Has edema in his left  hand, and reports that it was painful over the weekend. Pt. Has no reports of pain during reassessment, however has edema in the hand, and presents with a decrease in left grip strength, and has experienced a significant decrease in [facility] today. Pt. Continues to benefit from skilled OT services to work on improving Left sided awareness, LUE strength, motor control, and [facility]ills in preparation for improving with overall  ADL, and IADL functioning.   OT Occupational Profile and History  Problem Focused Assessment - Including review of records relating to presenting problem    Occupational performance deficits (Please refer to evaluation for details):  ADL's    Body Structure / Function / Physical Skills  UE functional use;Coordination;FMC;Dexterity;Strength;ROM    Cognitive Skills  Attention;Memory;Emotional;Problem Solve;Safety Awareness    Rehab Potential  Good    Clinical Decision Making  Several treatment options, min-mod task modification  necessary    Comorbidities Affecting Occupational Performance:  Presence of comorbidities impacting occupational performance    Comorbidities impacting occupational performance description:  Phyical, cognitive, visual,  medical comorbidities    Modification or Assistance to Complete Evaluation   No modification of tasks or assist necessary to complete eval    OT Frequency  2x / week    OT Duration  12 weeks    OT Treatment/Interventions  Self-care/ADL training;DME and/or AE instruction;Therapeutic exercise;Therapeutic activities;Moist Heat;Cognitive remediation/compensation;Neuromuscular education;Visual/perceptual remediation/compensation;Coping strategies training;Patient/family education;Passive range of motion;Psychosocial skills training;Energy conservation;Functional Mobility Training    Consulted and Agree with Plan of Care  Patient       Patient will benefit from skilled therapeutic intervention in order to improve the following deficits and  impairments:   Body Structure / Function / Physical Skills: UE functional use, Coordination, FMC, Dexterity, Strength, ROM Cognitive Skills: Attention, Memory, Emotional, Problem Solve, Safety Awareness     Visit Diagnosis: Muscle weakness (generalized)  Other lack of coordination    Problem List Patient Active Problem List   Diagnosis Date Noted  . Sepsis ([place]) 10/18/2018    Harrel Carina, MS, OTR/L 06/23/2019, 2:46 PM  [place] MAIN [facility] SERVICES [address] [place], [place], [address] Phone: [phone]   Fax:  [phone]  Name: Kenneth Summers MRN: [id number] Date of Birth: 27-Mar-1945

## 2019-06-23 NOTE — Therapy (Signed)
[place] MAIN [facility] SERVICES [address] [place], [place], [address] Phone: [phone]   Fax:  [phone]  Physical Therapy Treatment  Patient Details  Name: Kenneth Summers MRN: [id number] Date of Birth: [birth year] No data recorded  Encounter Date: 06/23/2019  PT End of Session - 06/23/19 1256    Visit Number  137    Number of Visits  180    Date for PT Re-Evaluation  08/20/19    Authorization Type  Medicare reporting period starting 09/11/18    Authorization Time Period  goals last assessed 05/26/19    PT Start Time  1302    PT Stop Time  1345    PT Time Calculation (min)  43 min    Equipment Utilized During Treatment  Gait belt    Activity Tolerance  Patient tolerated treatment well;No increased pain    Behavior During Therapy  WFL for tasks assessed/performed       Past Medical History:  Diagnosis Date  . Actinic keratosis   . Alcohol abuse   . APS (antiphospholipid syndrome) ([place])   . Basal cell carcinoma   . BPH (benign prostatic hyperplasia)   . CAD (coronary artery disease)   . Cellulitis   . Cervical spinal stenosis   . Chronic osteomyelitis ([place])   . CKD (chronic kidney disease)   . CKD (chronic kidney disease)   . Clostridium difficile diarrhea   . Depression   . Diplopia   . Fatigue   . GERD (gastroesophageal reflux disease)   . Hyperlipemia   . Hypertension   . Hypovitaminosis D   . IBS (irritable bowel syndrome)   . 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 Insomnia   . Left lumbosacral radiculopathy   . Moderate obstructive sleep apnea   . Osteomyelitis of foot ([place])   . PAD (peripheral artery disease) ([place])   . Pruritus   . RA (rheumatoid arthritis) ([place])   . Radiculopathy   . Restless legs syndrome   . RLS (restless legs syndrome)   . SLE (systemic lupus erythematosus related syndrome) ([place])   . 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 Stroke ([place])   . Toxic maculopathy of both  eyes     Past Surgical History:  Procedure Laterality Date  . CARDIAC CATHETERIZATION    . CAROTID ENDARTERECTOMY    . CERVICAL LAMINECTOMY    . CORONARY ANGIOPLASTY    . FRACTURE SURGERY    . fusion C5-6-7    . HERNIA REPAIR    . KNEE ARTHROSCOPY    . TONSILLECTOMY      There were no vitals filed for this visit.  Subjective Assessment - 06/23/19 1256    Subjective  Patient reports doing well; No new complaints. he reports adherence with HEP; His caregiver (Daughter) states that he has been moving better and able to lift his left leg easier.    Patient is accompained by:  Family member    Pertinent History  Patient is a 74 y.o. male who presents to outpatient physical therapy with a referral for medical diagnosis of CVA. This patient's chief complaints consist of left hemiplegia and overall deconditioning leading to the following functional deficits: dependent for ADLs, IADLs, unable to transfer to car, dependent transfers at home with hoyer lift, unable to walk or stand without significant assistance, difficulty with W/C navigation..    Limitations  Sitting;Lifting;Standing;House hold activities;Writing;Walking;Other (comment)    Currently in  Pain?  No/denies    Multiple Pain Sites  No          TREATMENT: Prior to exercise: Patient transferred stand pivot wheelchair to Nustep with min A +1 with mod VCs for hand placement and positioning;  Following transfer: Patient instructed in exercise on nustep, BUE/BLE level 2 x4 min, additional minute with LE only, instructed patient to increase steps per minute >50 with good tolerance; Completed 0.15 miles;   Following Nustep, patient transferred Nustep to wheelchair mod A +2 with cues for forward weight shift and hand placement; He had increased difficulty with pivot to right side with heavier posterior lean;  Patient Transferred sit<>Stand in parallel bars (seat adjusted to 27 inch from floor height) x7reps throughout session;He  required min A to close supervision with each transfer, requiring increased assistance initially due to heavy posterior lean and then progressing to supervision for 1-2 transfers and then finishing with min A required due to fatigue;  Standing in parallel bars: Standing,CGA to close supervision with min VCs for weight shift and increased extension for better stance control; -weight shift side/side x10 reps with min A and min VCs for erect posture and increased LLE knee extension in stance;  -Mini squat with B rail assist x15reps with cues to increase knee extension in BLE between repetitions; Patient able to exhibit good quad activation in each LE with squats; He does fatigue quickly with increased posterior lean requiring min A for balance;   Patient initially exhibits flexed posture with decreased hip extension; He was able to exhibit improved hip extension during subsequent transfers; Although he continues to require min A for safety and trunk control;  Patient required minA to scoot back in chair at end of session due to increased fatigue  Response to treatment: Tolerated[place]. Patient was more fatigued this session. Unsure if this is related to doing Nustep activity or if this is related to it being 5 days since last session when he last stood. Patient is unable to stand at home due to safety concerns and LE weakness.   Patient was able to progress with transfers during session but does fatigue quickly; HEP reinforced;                     PT Education - 06/23/19 1256    Education Details  transfers/weight shift, LE strengthening, HEP    Person(s) Educated  Patient    Methods  Explanation;Verbal cues    Comprehension  Verbalized understanding;Returned demonstration;Verbal cues required;Need further instruction       PT Short Term Goals - 05/28/19 0717      PT SHORT TERM GOAL #1   Title  Be independent with initial home exercise program for self-management  of symptoms.    Baseline  HEP to be given at second session; advice to practice unsupported sitting at home (06/19/2018); 07/08/18: Performing at home, 6/29: needs assistance but is doing exercise program regularly; 08/22/18: adherent;    Time  2    Period  Weeks    Status  Achieved    Target Date  06/26/18        PT Long Term Goals - 05/28/19 0717      PT LONG TERM GOAL #1   Title  Patient will complete all bed mobility with min A to improve functional independence for getting in and out of bed and adjusting in bed.     Baseline  max A - total A reported by family (06/19/2018); 07/08/18: still  requires maxA, dtr reports improvement in rolling since starting therapy; 6/23: min A to supervision in clinic; not doing at home right; 8/5: Pt's daughter states that his rolling has improved, but still has difficulty with all other bed mobility; 10/23/18: pts daughter states that she is doing about 35% of the work if he is in proper position, he does uses the bed rails to help roll;  using Harrel Lemon for coming to sit EOB; 10/12: pts daughter states that she is doing about 30% of the work if he is in proper position, he does uses the bed rails to help roll, but it is improving;  using Hoyer for coming to sit EOB, 10/29: min A for sit to sidelying, rolling supervision, able to transition sidelying to sit with HHA;    Time  12    Period  Weeks    Status  Achieved    Target Date  12/04/18      PT LONG TERM GOAL #2   Title  Patient will complete sit <> stand transfer chair to chair with Min A and LRAD to improve functional independence for household and community mobility.     Baseline  supervision for STS, still requires elevated surface height to 27"    Time  12    Period  Weeks    Status  Achieved    Target Date  05/23/19      PT LONG TERM GOAL #3   Title  Patient will navigate power w/c with min A x 100 feet to improve mobility for household and short community distances.    Baseline  required total A  (06/12/2018); able to roll 20 feet with min A (06/20/2018); 07/08/18: Pt can go approximately 10' without assist per family;  10/23/2018: pt's daughter states that his power WC mobility has become 100% better with the new hand control, slowed speed, and with practice; 10/12: pt's daughter reports that he can perform household WC mobility and limited community ambulation with minA    Time  12    Period  Weeks    Status  Achieved    Target Date  05/23/19      PT LONG TERM GOAL #4   Title  Patient will complete car transfers W/C to family SUV with min A using LRAD to improve his abilty to participate in community activities.     Baseline  unable to complete car transfers at all (06/12/2018); unable to complete car transfers at all (06/19/2018).; 07/08/18: Unable to perform at this time; 09/11/18: unable to perform at this time; 10/23/2018: unable to complete car transfers at all; 10/12:  unable to complete car transfers at all, 10/29: unable; 01/13/19: unable; 2/11: min-mod A +2, 3/10: min A+1    Time  12    Period  Weeks    Status  Achieved    Target Date  05/23/19      PT LONG TERM GOAL #5   Title  Patient will ambulate at least 10 feet with min A using LRAD to improve mobility for household and community distances.    Baseline  unable to take steps (06/12/2018); able to shift R leg forward and back with max A and RW at edge of plinth (06/19/2018); 07/08/18: unable to ambulate at this time. 09/11/18: unable to perform at this time; 10/23/2018: unable to perform at this time; 10/12: unable to perform at this time, 10/29: unable at this time; 01/13/19: unable at this time, 2/11: min A to step with RLE, unable  to step with LLE, 3/10: able to take 2 steps in parallel bars with min A for safety; 4/1: min A for walking 10 feet in parallel bars    Time  12    Period  Weeks    Status  Partially Met    Target Date  08/20/19      Additional Long Term Goals   Additional Long Term Goals  Yes      PT LONG TERM GOAL #6   Title   Patient will be able to safety navigate up/down ramp with power chair, exhibiting good safety awareness, mod I to safely enter/exit his home.    Baseline  2/11: supervision with intermittent min A;4/1: supervision;    Time  12    Period  Weeks    Status  Partially Met    Target Date  08/20/19      PT LONG TERM GOAL #7   Title  Patient will increase functional reach test to >15 inches in sitting to exhibit improved sitting balance and positioning and reduce fall risk;    Baseline  07/30/18: 12 inches; 09/11/18: 10-12 inches; 10/23/18: 12-13 inch; 10/12: 14.5", 10/29: 15.5 inch    Time  4    Period  Weeks    Status  Achieved    Target Date  05/23/19      PT LONG TERM GOAL #8   Title  Patient will increase LLE quad strength to 3+/5 to improve functional strength for standing and mobility;    Baseline  10/28: 2+/5; 01/13/19: 2+/5, 2/11: 3-/5, 04/16/19: 3-/5, 05/08/19: 3-/5    Time  12    Period  Weeks    Status  Partially Met    Target Date  08/20/19      PT LONG TERM GOAL  #9   TITLE  Patient will report no vertigo with provoking motions or positions in order to be able to perform ADLs and mobility tasks without symptoms.    Baseline  reports severe dizziness with room spinning causing nausea and vomitting;    Time  12    Period  Weeks    Status  New    Target Date  08/20/19              Patient will benefit from skilled therapeutic intervention in order to improve the following deficits and impairments:     Visit Diagnosis: Muscle weakness (generalized)  Other lack of coordination  Difficulty in walking, not elsewhere classified  Repeated falls     Problem List Patient Active Problem List   Diagnosis Date Noted  . Sepsis ([place]) 10/18/2018    Trotter,Margaret 06/23/2019, 1:08 PM  [place] MAIN [facility] SERVICES [address] [place], [place], [address] Phone: [phone]   Fax:  [phone]  Name: Kenneth Summers MRN:  [id number] Date of Birth: 08-06-1945

## 2019-06-25 ENCOUNTER — Ambulatory Visit: Payer: Medicare Other | Admitting: Physical Therapy

## 2019-06-25 ENCOUNTER — Other Ambulatory Visit: Payer: Self-pay

## 2019-06-25 ENCOUNTER — Encounter: Payer: Self-pay | Admitting: Physical Therapy

## 2019-06-25 DIAGNOSIS — M6281 Muscle weakness (generalized): Secondary | ICD-10-CM | POA: Diagnosis not present

## 2019-06-25 DIAGNOSIS — R296 Repeated falls: Secondary | ICD-10-CM

## 2019-06-25 DIAGNOSIS — R262 Difficulty in walking, not elsewhere classified: Secondary | ICD-10-CM

## 2019-06-25 DIAGNOSIS — R278 Other lack of coordination: Secondary | ICD-10-CM

## 2019-06-25 NOTE — Therapy (Signed)
[place] MAIN [facility] SERVICES [address] [place], [place], [address] Phone: [phone]   Fax:  [phone]  Physical Therapy Treatment  Patient Details  Name: Kenneth Summers MRN: [id number] Date of Birth: 11-14-1945 No data recorded  Encounter Date: 06/25/2019  PT End of Session - 06/25/19 1301    Visit Number  [id number]    Number of Visits  180    Date for PT Re-Evaluation  08/20/19    Authorization Type  Medicare reporting period starting 09/11/18    Authorization Time Period  goals last assessed 05/26/19    PT Start Time  1302    PT Stop Time  1345    PT Time Calculation (min)  43 min    Equipment Utilized During Treatment  Gait belt    Activity Tolerance  Patient tolerated treatment well;No increased pain    Behavior During Therapy  WFL for tasks assessed/performed       Past Medical History:  Diagnosis Date  . Actinic keratosis   . Alcohol abuse   . APS (antiphospholipid syndrome) ([place])   . Basal cell carcinoma   . BPH (benign prostatic hyperplasia)   . CAD (coronary artery disease)   . Cellulitis   . Cervical spinal stenosis   . Chronic osteomyelitis ([place])   . CKD (chronic kidney disease)   . CKD (chronic kidney disease)   . Clostridium difficile diarrhea   . Depression   . Diplopia   . Fatigue   . GERD (gastroesophageal reflux disease)   . Hyperlipemia   . Hypertension   . Hypovitaminosis D   . IBS (irritable bowel syndrome)   . 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 Insomnia   . Left lumbosacral radiculopathy   . Moderate obstructive sleep apnea   . Osteomyelitis of foot ([place])   . PAD (peripheral artery disease) ([place])   . Pruritus   . RA (rheumatoid arthritis) ([place])   . Radiculopathy   . Restless legs syndrome   . RLS (restless legs syndrome)   . SLE (systemic lupus erythematosus related syndrome) ([place])   . 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 Stroke ([place])   . Toxic maculopathy of both  eyes     Past Surgical History:  Procedure Laterality Date  . CARDIAC CATHETERIZATION    . CAROTID ENDARTERECTOMY    . CERVICAL LAMINECTOMY    . CORONARY ANGIOPLASTY    . FRACTURE SURGERY    . fusion C5-6-7    . HERNIA REPAIR    . KNEE ARTHROSCOPY    . TONSILLECTOMY      There were no vitals filed for this visit.  Subjective Assessment - 06/25/19 1446    Subjective  Patient reports increased fatigue this session. His caregiver reports he has had a harder time operating his power chair; Denies any new falls;    Patient is accompained by:  Family member    Pertinent History  Patient is a 74 y.o. male who presents to outpatient physical therapy with a referral for medical diagnosis of CVA. This patient's chief complaints consist of left hemiplegia and overall deconditioning leading to the following functional deficits: dependent for ADLs, IADLs, unable to transfer to car, dependent transfers at home with hoyer lift, unable to walk or stand without significant assistance, difficulty with W/C navigation..    Limitations  Sitting;Lifting;Standing;House hold activities;Writing;Walking;Other (comment)    Currently in Pain?  No/denies    Multiple  Pain Sites  No           TREATMENT:  Prior to transfersand in between transfers: Patient sittingin wheelchair: -hip flexion marchx15 reps bilaterally -LAQ x15reps each LE; Ptrequires verbal and visual cues to improve ROM;He was able to exhibit better trunk control but does have difficulty increasing LLE AROM this session;  Following exercise: Transferred sit<>Stand in parallel bars (seat adjusted to 27 inch from floor height) x7reps throughout session;He was able to transfer CGA toclose supervisionwithminVCs for forward weight shift for better trunk control and transfer ability;  Standing in parallel bars: Standing,CGA to close supervision with min VCs for weight shift and increased extension for better stance  control; -weight shift side/side x10 reps with CGA and min VCs for erect posture and increased LLE knee extension in stance;  -Mini squat with B rail assist x10-12reps with cues to increase knee extension in BLE between repetitions; Patient able to exhibit good quad activation in each LE with squats;  -weight shift to neutral, with cues for hip extensionand to shift to neutral without heavy right lateral lean Progressed to reducing rail assist from 2-1-0 intermittent rail assist x15-20 sec x2 setsto facilitate increased weight bearing in BLE and to improve LE weight acceptance.   Patient initially exhibits flexed posture with decreased hip extension; He was able to exhibit improved hip extension during subsequent transfers; Although he continues to require min A for safety and trunk control;  Patient required minA to scoot back in chair at end of session due to increased fatigue  Response to treatment: Toleratedfair. Patient initially was able to exhibit better standing posture and positioning but fatigues quickly. He had a hard time achieving hip extension in stance. Patient also heavily distracted this session requiring increased cues for re-direct to task. Patient reports increased neck discomfort by end of session (4/10).                       PT Education - 06/25/19 1301    Education Details  transfers/weight shift; LE strengthening HEP    Person(s) Educated  Patient    Methods  Explanation;Verbal cues    Comprehension  Verbalized understanding;Returned demonstration;Verbal cues required;Need further instruction       PT Short Term Goals - 05/28/19 0717      PT SHORT TERM GOAL #1   Title  Be independent with initial home exercise program for self-management of symptoms.    Baseline  HEP to be given at second session; advice to practice unsupported sitting at home (06/19/2018); 07/08/18: Performing at home, 6/29: needs assistance but is doing  exercise program regularly; 08/22/18: adherent;    Time  2    Period  Weeks    Status  Achieved    Target Date  06/26/18        PT Long Term Goals - 05/28/19 0717      PT LONG TERM GOAL #1   Title  Patient will complete all bed mobility with min A to improve functional independence for getting in and out of bed and adjusting in bed.     Baseline  max A - total A reported by family (06/19/2018); 07/08/18: still requires maxA, dtr reports improvement in rolling since starting therapy; 6/23: min A to supervision in clinic; not doing at home right; 8/5: Pt's daughter states that his rolling has improved, but still has difficulty with all other bed mobility; 10/23/18: pts daughter states that she is doing about 35% of the  work if he is in proper position, he does uses the bed rails to help roll;  using Harrel Lemon for coming to sit EOB; 10/12: pts daughter states that she is doing about 30% of the work if he is in proper position, he does uses the bed rails to help roll, but it is improving;  using Hoyer for coming to sit EOB, 10/29: min A for sit to sidelying, rolling supervision, able to transition sidelying to sit with HHA;    Time  12    Period  Weeks    Status  Achieved    Target Date  12/04/18      PT LONG TERM GOAL #2   Title  Patient will complete sit <> stand transfer chair to chair with Min A and LRAD to improve functional independence for household and community mobility.     Baseline  supervision for STS, still requires elevated surface height to 27"    Time  12    Period  Weeks    Status  Achieved    Target Date  05/23/19      PT LONG TERM GOAL #3   Title  Patient will navigate power w/c with min A x 100 feet to improve mobility for household and short community distances.    Baseline  required total A (06/12/2018); able to roll 20 feet with min A (06/20/2018); 07/08/18: Pt can go approximately 10' without assist per family;  10/23/2018: pt's daughter states that his power WC mobility has become  100% better with the new hand control, slowed speed, and with practice; 10/12: pt's daughter reports that he can perform household WC mobility and limited community ambulation with minA    Time  12    Period  Weeks    Status  Achieved    Target Date  05/23/19      PT LONG TERM GOAL #4   Title  Patient will complete car transfers W/C to family SUV with min A using LRAD to improve his abilty to participate in community activities.     Baseline  unable to complete car transfers at all (06/12/2018); unable to complete car transfers at all (06/19/2018).; 07/08/18: Unable to perform at this time; 09/11/18: unable to perform at this time; 10/23/2018: unable to complete car transfers at all; 10/12:  unable to complete car transfers at all, 10/29: unable; 01/13/19: unable; 2/11: min-mod A +2, 3/10: min A+1    Time  12    Period  Weeks    Status  Achieved    Target Date  05/23/19      PT LONG TERM GOAL #5   Title  Patient will ambulate at least 10 feet with min A using LRAD to improve mobility for household and community distances.    Baseline  unable to take steps (06/12/2018); able to shift R leg forward and back with max A and RW at edge of plinth (06/19/2018); 07/08/18: unable to ambulate at this time. 09/11/18: unable to perform at this time; 10/23/2018: unable to perform at this time; 10/12: unable to perform at this time, 10/29: unable at this time; 01/13/19: unable at this time, 2/11: min A to step with RLE, unable to step with LLE, 3/10: able to take 2 steps in parallel bars with min A for safety; 4/1: min A for walking 10 feet in parallel bars    Time  12    Period  Weeks    Status  Partially Met    Target Date  08/20/19      Additional Long Term Goals   Additional Long Term Goals  Yes      PT LONG TERM GOAL #6   Title  Patient will be able to safety navigate up/down ramp with power chair, exhibiting good safety awareness, mod I to safely enter/exit his home.    Baseline  2/11: supervision with intermittent  min A;4/1: supervision;    Time  12    Period  Weeks    Status  Partially Met    Target Date  08/20/19      PT LONG TERM GOAL #7   Title  Patient will increase functional reach test to >15 inches in sitting to exhibit improved sitting balance and positioning and reduce fall risk;    Baseline  07/30/18: 12 inches; 09/11/18: 10-12 inches; 10/23/18: 12-13 inch; 10/12: 14.5", 10/29: 15.5 inch    Time  4    Period  Weeks    Status  Achieved    Target Date  05/23/19      PT LONG TERM GOAL #8   Title  Patient will increase LLE quad strength to 3+/5 to improve functional strength for standing and mobility;    Baseline  10/28: 2+/5; 01/13/19: 2+/5, 2/11: 3-/5, 04/16/19: 3-/5, 05/08/19: 3-/5    Time  12    Period  Weeks    Status  Partially Met    Target Date  08/20/19      PT LONG TERM GOAL  #9   TITLE  Patient will report no vertigo with provoking motions or positions in order to be able to perform ADLs and mobility tasks without symptoms.    Baseline  reports severe dizziness with room spinning causing nausea and vomitting;    Time  12    Period  Weeks    Status  New    Target Date  08/20/19            Plan - 06/25/19 1313    Clinical Impression Statement  Patient motivated and participated well within session; He was fatigued this session but was able to exhibit better trunk control in standing; He does continues to require cues for erect posture, weight shift forward and better hip extension for improved stance control. Patient instructed in advanced LE strengthening exercise. He requires min VCs for proper positioning/exercise technique for optimal strengthening. He would benefit from additional skilled PT intervention to improve strength, balance and mobility;    Personal Factors and Comorbidities  Age;Comorbidity 3+    Comorbidities  Relevant past medical history and comorbidities include long term steroid use for lupus, CKD, chronic osteomyelitis, cervical spine stenosis, BPH, APS,  alcohol abuse, peripheral artery disease, depression, GERD, obstructive sleep apnea, HTN, IBS, IgA deficiency, lumbar radiculopathy, RA, Stroke, toxic maculopathy in both eyes, systemic lupus erythematosus related syndrome, cardiac catheterization, carotid endarterectomy, cervical laminectomy, coronary angioplasty, Fusion C5-C7, knee arthroplasty, lumbar surgery.    Examination-Activity Limitations  Bed Mobility;Bend;Sit;Toileting;Stand;Stairs;Lift;Transfers;Squat;Locomotion Level;Carry;Dressing;Hygiene/Grooming;Continence    Examination-Participation Restrictions  Yard Work;Interpersonal Relationship;Community Activity    Stability/Clinical Decision Making  Evolving/Moderate complexity    Rehab Potential  Fair    PT Frequency  3x / week    PT Duration  12 weeks    PT Treatment/Interventions  ADLs/Self Care Home Management;Electrical Stimulation;Moist Heat;Cryotherapy;Gait training;Stair training;Functional mobility training;Therapeutic exercise;Balance training;Neuromuscular re-education;Cognitive remediation;Patient/family education;Orthotic Fit/Training;Wheelchair mobility training;Manual techniques;Passive range of motion;Energy conservation;Joint Manipulations;Canalith Repostioning;Therapeutic activities;Vestibular    PT Next Visit Plan  Work on strength, sitting/standing balance, functional activities such as rolling,  bed mobility, transfers; caregiver training    PT Home Exercise Plan  no updates this session    Consulted and Agree with Plan of Care  Patient;Family member/caregiver    Family Member Consulted  Wife       Patient will benefit from skilled therapeutic intervention in order to improve the following deficits and impairments:  Abnormal gait, Decreased activity tolerance, Decreased cognition, Decreased endurance, Decreased knowledge of use of DME, Decreased range of motion, Decreased skin integrity, Decreased strength, Impaired perceived functional ability, Impaired sensation,  Impaired UE functional use, Improper body mechanics, Pain, Cardiopulmonary status limiting activity, Decreased balance, Decreased coordination, Decreased mobility, Difficulty walking, Impaired tone, Postural dysfunction, Dizziness  Visit Diagnosis: Muscle weakness (generalized)  Other lack of coordination  Difficulty in walking, not elsewhere classified  Repeated falls     Problem List Patient Active Problem List   Diagnosis Date Noted  . Sepsis ([place]) 10/18/2018    Nalin Mazzocco PT, DPT 06/25/2019, 2:47 PM  [place] MAIN [facility] SERVICES [address] [place], [place], [address] Phone: [phone]   Fax:  [phone]  Name: IMAAD REUSS MRN: [id number] Date of Birth: 1945/10/19

## 2019-06-26 ENCOUNTER — Ambulatory Visit: Payer: Medicare Other | Admitting: Occupational Therapy

## 2019-06-26 ENCOUNTER — Encounter: Payer: Self-pay | Admitting: Physical Therapy

## 2019-06-26 ENCOUNTER — Encounter: Payer: Self-pay | Admitting: Occupational Therapy

## 2019-06-26 ENCOUNTER — Ambulatory Visit: Payer: Medicare Other | Admitting: Physical Therapy

## 2019-06-26 ENCOUNTER — Other Ambulatory Visit: Payer: Self-pay

## 2019-06-26 DIAGNOSIS — R278 Other lack of coordination: Secondary | ICD-10-CM

## 2019-06-26 DIAGNOSIS — M6281 Muscle weakness (generalized): Secondary | ICD-10-CM | POA: Diagnosis not present

## 2019-06-26 DIAGNOSIS — R262 Difficulty in walking, not elsewhere classified: Secondary | ICD-10-CM

## 2019-06-26 DIAGNOSIS — R296 Repeated falls: Secondary | ICD-10-CM

## 2019-06-26 NOTE — Therapy (Signed)
[place] MAIN [facility] SERVICES [address] [place], [place], [address] Phone: [phone]   Fax:  [phone]  Occupational Therapy Treatment  Patient Details  Name: Kenneth Summers MRN: [id number] Date of Birth: April 04, 1945 No data recorded  Encounter Date: 06/26/2019  OT End of Session - 06/26/19 1310    Visit Number  114    Number of Visits  157    Date for OT Re-Evaluation  09/15/19    Authorization Type  Progress report period starting 12/26/2018    OT Start Time  1302    OT Stop Time  1345    OT Time Calculation (min)  43 min    Activity Tolerance  Patient tolerated treatment well;Patient limited by fatigue    Behavior During Therapy  [facility] for tasks assessed/performed       Past Medical History:  Diagnosis Date  . Actinic keratosis   . Alcohol abuse   . APS (antiphospholipid syndrome) ([place])   . Basal cell carcinoma   . BPH (benign prostatic hyperplasia)   . CAD (coronary artery disease)   . Cellulitis   . Cervical spinal stenosis   . Chronic osteomyelitis ([place])   . CKD (chronic kidney disease)   . CKD (chronic kidney disease)   . Clostridium difficile diarrhea   . Depression   . Diplopia   . Fatigue   . GERD (gastroesophageal reflux disease)   . Hyperlipemia   . Hypertension   . Hypovitaminosis D   . IBS (irritable bowel syndrome)   . 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 Insomnia   . Left lumbosacral radiculopathy   . Moderate obstructive sleep apnea   . Osteomyelitis of foot ([place])   . PAD (peripheral artery disease) ([place])   . Pruritus   . RA (rheumatoid arthritis) ([place])   . Radiculopathy   . Restless legs syndrome   . RLS (restless legs syndrome)   . SLE (systemic lupus erythematosus related syndrome) ([place])   . 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 Stroke ([place])   . Toxic maculopathy of both eyes     Past Surgical History:  Procedure Laterality Date  . CARDIAC CATHETERIZATION    .  CAROTID ENDARTERECTOMY    . CERVICAL LAMINECTOMY    . CORONARY ANGIOPLASTY    . FRACTURE SURGERY    . fusion C5-6-7    . HERNIA REPAIR    . KNEE ARTHROSCOPY    . TONSILLECTOMY      There were no vitals filed for this visit.  Subjective Assessment - 06/26/19 1310    Subjective   Pt. reports no pain today.    Patient is accompanied by:  Family member    Pertinent History  Pt. is a 74 y.o. 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 Pt. PMHx includes: Multiple Falls, Lupus, DJD, Renal Abscess, CVA. Pt. resides with his wife. Pt.'s wife and daughter assist with ADLs. Pt. has caregivers in  for 2 hours a day, 6 days a week. Pt. received Rehab services in acute care, at SNF for STR, and [place] services. Pt. is retired from [employer] for [employer] and [employer].    Currently in Pain?  No/denies       OT TREATMENT    Neuro muscular re-education:  Pt. worked on using his LUE for reaching, grasping, and flipping [place] style discs. Pt. worked or reaching to the  far left, and far right with trunk rotation when flipping 60 discs lined up into 4 rows of 15 discs. Pt. worked on flipping the the disc simulateously with both bilateral hands. Pt. worked on using his left hand to stack the discs in groups of 3, and place them into the case. Pt. had difficulty with using his left hand to stack the discs, and well as incorporating the left hand during bilateral hand coordination. Increased, and consistent cues were required to use his left hand throughout the task.   Pt. reports that he talked with his physician through the phone today, and is planning to start on a new seizure medication tomorrow. Pt. reports that his left hand is feeling better today. Pt. reports feeling weaker today. Pt. required increased rest breaks, and increased time to complete the task today. Pt. required increased cues to engage his LUE in, and consistently use his left UE throughout the  task today. Pt. required more cuing to engage his trunk when flexing forward with reaching. Pt.continues to work on improving LUE functioning, left sidedawareness, trunk control, and processing skills needed for ADL, and IADL functioning.                      OT Education - 06/26/19 1310    Education Details  Left UE functioning, left sided awareness.    Person(s) Educated  Patient    Methods  Explanation;Demonstration    Comprehension  Returned demonstration;Tactile cues required;Verbalized understanding;Need further instruction          OT Long Term Goals - 06/23/19 1414      OT LONG TERM GOAL #1   Title  Pt. will increase left shoulder flexion AROM by 10 degrees to access cabinet/shelf.    Baseline  Shoulder flexion:130. Pt. is progressing, and continues to work on reaching with increasing emphasis on flexing trunk to improve functional reach.    Time  12    Period  Weeks    Status  Partially Met    Target Date  09/15/19      OT LONG TERM GOAL #2   Title  Pt. will donn a shirt with Supervision.    Baseline  Pt. continues to require MinA donning a zip down jacket with consistent cues to initiate the correct direction of the jacket.    Time  12    Status  On-going    Target Date  09/15/19      OT LONG TERM GOAL #4   Title  Pt. will improve left grip strength by 10# to be able to open a jar/container.    Baseline  Pt. continues to work towards opening containers, and bottles.    Time  12    Period  Weeks    Status  On-going    Target Date  09/15/19      OT LONG TERM GOAL #5   Title  Pt. will improve left hand [facility] skills to be able to assist with buttoning/zipping.    Baseline  Pt. is improving with manipulating the zipper on his jacket, however continues to require practice. Pt. continues to work on improving bilateral [facility] skills for buttoning/zipping    Time  12    Period  Weeks    Status  On-going    Target Date  09/15/19      OT LONG TERM GOAL  #6   Title  Pt. will demonstrate visual conmpensatory strategies for 100% of the time during ADLs  Baseline  Pt. requires  fewer cues for left sides awareness.    Time  12    Period  Weeks    Status  Partially Met    Target Date  09/15/19      OT LONG TERM GOAL #8   Title  Pt. will accurately identify potential safety hazard using good safety awareness, and judgement 100% for ADLs, and IADLs.    Baseline  Pt. requires cues    Time  12    Period  Weeks    Status  New    Target Date  09/15/19      OT LONG TERM GOAL  #9   TITLE  Pt. will  navigate his w/c around obstacles with Supervision and 100% accuracy    Baseline  Pt. continues to require cuing for obstacle on the left, however requires less cues.    Time  12    Period  Weeks    Status  On-going    Target Date  09/15/19      OT LONG TERM GOAL  #11   TITLE  Pt. will increase BUE strength by 9m grades in preparation for ADL transfers.    Baseline  Pt. is improving with LUE strength    Time  12    Period  Weeks    Status  On-going    Target Date  09/15/19      OT LONG TERM GOAL  #12   TITLE  Pt. will improve LUE functional reaching with the left engaging the trunk 100% of the time during ADL tasks with minimal cues.    Baseline  06/23/2019: Pt. is improving with LUE functional reachin, howwver requires cues to engage trunk for extended reaching.    Time  12    Period  Weeks    Status  On-going    Target Date  09/15/19            Plan - 06/26/19 1311    Clinical Impression Statement  Pt. reports that he talked with his physician through the phone today, and is planning to start on a new seizure medication tomorrow. Pt. reports that his left hand is feeling better today. Pt. reports feeling weaker today. Pt. required increased rest breaks, and increased time to complete the task today. Pt. required increased cues to engage his LUE in, and consistently use his left UE throughout the task today. Pt. required more cuing to  engage his trunk when flexing forward with reaching. Pt.continues to work on improving LUE functioning, left sidedawareness, trunk control, and processing skills needed for ADL, and IADL functioning.   OT Occupational Profile and History  Problem Focused Assessment - Including review of records relating to presenting problem    Occupational performance deficits (Please refer to evaluation for details):  ADL's    Body Structure / Function / Physical Skills  UE functional use;Coordination;FMC;Dexterity;Strength;ROM    Cognitive Skills  Attention;Memory;Emotional;Problem Solve;Safety Awareness    Rehab Potential  Good    Clinical Decision Making  Several treatment options, min-mod task modification necessary    Comorbidities Affecting Occupational Performance:  Presence of comorbidities impacting occupational performance    Comorbidities impacting occupational performance description:  Phyical, cognitive, visual,  medical comorbidities    Modification or Assistance to Complete Evaluation   No modification of tasks or assist necessary to complete eval    OT Frequency  2x / week    OT Duration  12 weeks    OT Treatment/Interventions  Self-care/ADL  training;DME and/or AE instruction;Therapeutic exercise;Therapeutic activities;Moist Heat;Cognitive remediation/compensation;Neuromuscular education;Visual/perceptual remediation/compensation;Coping strategies training;Patient/family education;Passive range of motion;Psychosocial skills training;Energy conservation;Functional Mobility Training    Consulted and Agree with Plan of Care  Patient    Family Member Consulted  Daughter [name]       Patient will benefit from skilled therapeutic intervention in order to improve the following deficits and impairments:   Body Structure / Function / Physical Skills: UE functional use, Coordination, FMC, Dexterity, Strength, ROM Cognitive Skills: Attention, Memory, Emotional, Problem Solve, Safety Awareness      Visit Diagnosis: Muscle weakness (generalized)  Other lack of coordination    Problem List Patient Active Problem List   Diagnosis Date Noted  . Sepsis ([place]) 10/18/2018    Harrel Carina, MS, OTR/L 06/26/2019, 1:14 PM  [place] MAIN [facility] SERVICES [address] [place], [place], [address] Phone: [phone]   Fax:  [phone]  Name: STEFEN JUBA MRN: [id number] Date of Birth: [birth year]

## 2019-06-26 NOTE — Therapy (Signed)
[place] MAIN [facility] SERVICES [address] [place], [place], [address] Phone: [phone]   Fax:  [phone]  Physical Therapy Treatment  Patient Details  Name: Kenneth Summers MRN: [id number] Date of Birth: Jun 10, 1945 No data recorded  Encounter Date: 06/26/2019  PT End of Session - 06/26/19 1324    Visit Number  139    Number of Visits  180    Date for PT Re-Evaluation  08/20/19    Authorization Type  Medicare reporting period starting 09/11/18    Authorization Time Period  goals last assessed 05/26/19    PT Start Time  1346    PT Stop Time  1430    PT Time Calculation (min)  44 min    Equipment Utilized During Treatment  Gait belt    Activity Tolerance  Patient tolerated treatment well;No increased pain    Behavior During Therapy  WFL for tasks assessed/performed       Past Medical History:  Diagnosis Date  . Actinic keratosis   . Alcohol abuse   . APS (antiphospholipid syndrome) ([place])   . Basal cell carcinoma   . BPH (benign prostatic hyperplasia)   . CAD (coronary artery disease)   . Cellulitis   . Cervical spinal stenosis   . Chronic osteomyelitis ([place])   . CKD (chronic kidney disease)   . CKD (chronic kidney disease)   . Clostridium difficile diarrhea   . Depression   . Diplopia   . Fatigue   . GERD (gastroesophageal reflux disease)   . Hyperlipemia   . Hypertension   . Hypovitaminosis D   . IBS (irritable bowel syndrome)   . 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 Insomnia   . Left lumbosacral radiculopathy   . Moderate obstructive sleep apnea   . Osteomyelitis of foot ([place])   . PAD (peripheral artery disease) ([place])   . Pruritus   . RA (rheumatoid arthritis) ([place])   . Radiculopathy   . Restless legs syndrome   . RLS (restless legs syndrome)   . SLE (systemic lupus erythematosus related syndrome) ([place])   . 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 Stroke ([place])   . Toxic maculopathy of both  eyes     Past Surgical History:  Procedure Laterality Date  . CARDIAC CATHETERIZATION    . CAROTID ENDARTERECTOMY    . CERVICAL LAMINECTOMY    . CORONARY ANGIOPLASTY    . FRACTURE SURGERY    . fusion C5-6-7    . HERNIA REPAIR    . KNEE ARTHROSCOPY    . TONSILLECTOMY      There were no vitals filed for this visit.  Subjective Assessment - 06/26/19 1350    Subjective  patient reports slight fatigue today, but not as bad as yesterday; He does report adjusting medication adding seroquel and increasing gabapentin;    Patient is accompained by:  Family member    Pertinent History  Patient is a 74 y.o. male who presents to outpatient physical therapy with a referral for medical diagnosis of CVA. This patient's chief complaints consist of left hemiplegia and overall deconditioning leading to the following functional deficits: dependent for ADLs, IADLs, unable to transfer to car, dependent transfers at home with hoyer lift, unable to walk or stand without significant assistance, difficulty with W/C navigation..    Limitations  Sitting;Lifting;Standing;House hold activities;Writing;Walking;Other (comment)    Currently in Pain?  No/denies    Multiple Pain Sites  No             TREATMENT:  Prior to transfersand in between transfers: Patient sittingin wheelchair: -hip flexion [id number] bilaterally Ptrequires verbal and visual cues to improve ROM;He was able to exhibit better trunk control but does have difficulty increasing LLE AROM this session;  Following exercise: Transferred sit<>Stand in parallel bars (seat adjusted to 27 inch from floor height) x8reps throughout session;He was able to transfer CGA toclose supervisionwithminVCs for forward weight shift for better trunk control and transfer ability;However last 2 transfers he required min A due to fatigue and difficulty with forward weight shift;   Standing in parallel bars: Standing,CGA to close  supervision with min VCs for weight shift and increased extension for better stance control; -weight shift side/side x10 reps with CGA and min VCs for erect posture and increased LLE knee extension in stance; -Mini squat with B rail assist x10-12reps with cues to increase knee extension in BLE between repetitions; Patient able to exhibit good quad activation in each LE with squats; -weight shift to neutral, with cues for hip extensionand to shift to neutral without heavy right lateral lean Progressed to reducing rail assist from 2-1-0intermittent rail assist x5-10sec x3 setsto facilitate increased weight bearing in BLE and to improve LE weight acceptance.   Patient initially exhibits flexed posture with decreased hip extension; He was able to exhibit improved hip extension during subsequent transfers; Although he continues to require min A for safety and trunk control; With increased fatigue he exhibits heavy posterior lean having difficulty maintaining standing position often sitting quickly due to LE fatigue and fear of falling;   Patient required modA to scoot back in chair at end of session due to increased fatigue  Response to treatment: Toleratedfair. Patient initially was able to exhibit better standing posture and positioning but fatigues quickly. He had a hard time achieving hip extension in stance.  He exhibits increased shortness of breath with subsequent transfers due to fatigue and decreased cardiovascular endurance;                    PT Education - 06/26/19 1324    Education Details  transfers/weight shift, LE strengthening, HEP    Person(s) Educated  Patient    Methods  Explanation;Verbal cues    Comprehension  Verbalized understanding;Returned demonstration;Verbal cues required;Need further instruction       PT Short Term Goals - 05/28/19 0717      PT SHORT TERM GOAL #1   Title  Be independent with initial home exercise program  for self-management of symptoms.    Baseline  HEP to be given at second session; advice to practice unsupported sitting at home (06/19/2018); 07/08/18: Performing at home, 6/29: needs assistance but is doing exercise program regularly; 08/22/18: adherent;    Time  2    Period  Weeks    Status  Achieved    Target Date  06/26/18        PT Long Term Goals - 05/28/19 0717      PT LONG TERM GOAL #1   Title  Patient will complete all bed mobility with min A to improve functional independence for getting in and out of bed and adjusting in bed.     Baseline  max A - total A reported by family (06/19/2018); 07/08/18: still requires maxA, dtr reports improvement in rolling since starting therapy; 6/23: min A to supervision in clinic; not doing at home right; 8/5: Pt's daughter states that his rolling  has improved, but still has difficulty with all other bed mobility; 10/23/18: pts daughter states that she is doing about 35% of the work if he is in proper position, he does uses the bed rails to help roll;  using Harrel Lemon for coming to sit EOB; 10/12: pts daughter states that she is doing about 30% of the work if he is in proper position, he does uses the bed rails to help roll, but it is improving;  using Hoyer for coming to sit EOB, 10/29: min A for sit to sidelying, rolling supervision, able to transition sidelying to sit with HHA;    Time  12    Period  Weeks    Status  Achieved    Target Date  12/04/18      PT LONG TERM GOAL #2   Title  Patient will complete sit <> stand transfer chair to chair with Min A and LRAD to improve functional independence for household and community mobility.     Baseline  supervision for STS, still requires elevated surface height to 27"    Time  12    Period  Weeks    Status  Achieved    Target Date  05/23/19      PT LONG TERM GOAL #3   Title  Patient will navigate power w/c with min A x 100 feet to improve mobility for household and short community distances.    Baseline   required total A (06/12/2018); able to roll 20 feet with min A (06/20/2018); 07/08/18: Pt can go approximately 10' without assist per family;  10/23/2018: pt's daughter states that his power WC mobility has become 100% better with the new hand control, slowed speed, and with practice; 10/12: pt's daughter reports that he can perform household WC mobility and limited community ambulation with minA    Time  12    Period  Weeks    Status  Achieved    Target Date  05/23/19      PT LONG TERM GOAL #4   Title  Patient will complete car transfers W/C to family SUV with min A using LRAD to improve his abilty to participate in community activities.     Baseline  unable to complete car transfers at all (06/12/2018); unable to complete car transfers at all (06/19/2018).; 07/08/18: Unable to perform at this time; 09/11/18: unable to perform at this time; 10/23/2018: unable to complete car transfers at all; 10/12:  unable to complete car transfers at all, 10/29: unable; 01/13/19: unable; 2/11: min-mod A +2, 3/10: min A+1    Time  12    Period  Weeks    Status  Achieved    Target Date  05/23/19      PT LONG TERM GOAL #5   Title  Patient will ambulate at least 10 feet with min A using LRAD to improve mobility for household and community distances.    Baseline  unable to take steps (06/12/2018); able to shift R leg forward and back with max A and RW at edge of plinth (06/19/2018); 07/08/18: unable to ambulate at this time. 09/11/18: unable to perform at this time; 10/23/2018: unable to perform at this time; 10/12: unable to perform at this time, 10/29: unable at this time; 01/13/19: unable at this time, 2/11: min A to step with RLE, unable to step with LLE, 3/10: able to take 2 steps in parallel bars with min A for safety; 4/1: min A for walking 10 feet in parallel bars  Time  12    Period  Weeks    Status  Partially Met    Target Date  08/20/19      Additional Long Term Goals   Additional Long Term Goals  Yes      PT LONG TERM  GOAL #6   Title  Patient will be able to safety navigate up/down ramp with power chair, exhibiting good safety awareness, mod I to safely enter/exit his home.    Baseline  2/11: supervision with intermittent min A;4/1: supervision;    Time  12    Period  Weeks    Status  Partially Met    Target Date  08/20/19      PT LONG TERM GOAL #7   Title  Patient will increase functional reach test to >15 inches in sitting to exhibit improved sitting balance and positioning and reduce fall risk;    Baseline  07/30/18: 12 inches; 09/11/18: 10-12 inches; 10/23/18: 12-13 inch; 10/12: 14.5", 10/29: 15.5 inch    Time  4    Period  Weeks    Status  Achieved    Target Date  05/23/19      PT LONG TERM GOAL #8   Title  Patient will increase LLE quad strength to 3+/5 to improve functional strength for standing and mobility;    Baseline  10/28: 2+/5; 01/13/19: 2+/5, 2/11: 3-/5, 04/16/19: 3-/5, 05/08/19: 3-/5    Time  12    Period  Weeks    Status  Partially Met    Target Date  08/20/19      PT LONG TERM GOAL  #9   TITLE  Patient will report no vertigo with provoking motions or positions in order to be able to perform ADLs and mobility tasks without symptoms.    Baseline  reports severe dizziness with room spinning causing nausea and vomitting;    Time  12    Period  Weeks    Status  New    Target Date  08/20/19            Plan - 06/26/19 1519    Clinical Impression Statement  Patient motivated and participated well within session. He is able to exhibit improved erect posture in standing this session but often exhibits heavy posterior lean with increased fatigue. Pt does require short seated rest breaks between standing activities due to fatigue. Instructed patient in LE strengthening exercise with verbal/visual cues to improve AROM for better strengthening. Patient would benefit from additional skilled PT intervention to improve strength, balance and gait safety;    Personal Factors and Comorbidities   Age;Comorbidity 3+    Comorbidities  Relevant past medical history and comorbidities include long term steroid use for lupus, CKD, chronic osteomyelitis, cervical spine stenosis, BPH, APS, alcohol abuse, peripheral artery disease, depression, GERD, obstructive sleep apnea, HTN, IBS, IgA deficiency, lumbar radiculopathy, RA, Stroke, toxic maculopathy in both eyes, systemic lupus erythematosus related syndrome, cardiac catheterization, carotid endarterectomy, cervical laminectomy, coronary angioplasty, Fusion C5-C7, knee arthroplasty, lumbar surgery.    Examination-Activity Limitations  Bed Mobility;Bend;Sit;Toileting;Stand;Stairs;Lift;Transfers;Squat;Locomotion Level;Carry;Dressing;Hygiene/Grooming;Continence    Examination-Participation Restrictions  Yard Work;Interpersonal Relationship;Community Activity    Stability/Clinical Decision Making  Evolving/Moderate complexity    Rehab Potential  Fair    PT Frequency  3x / week    PT Duration  12 weeks    PT Treatment/Interventions  ADLs/Self Care Home Management;Electrical Stimulation;Moist Heat;Cryotherapy;Gait training;Stair training;Functional mobility training;Therapeutic exercise;Balance training;Neuromuscular re-education;Cognitive remediation;Patient/family education;Orthotic Fit/Training;Wheelchair mobility training;Manual techniques;Passive range of motion;Energy conservation;Joint Manipulations;Canalith Repostioning;Therapeutic  activities;Vestibular    PT Next Visit Plan  Work on strength, sitting/standing balance, functional activities such as rolling, bed mobility, transfers; caregiver training    PT Home Exercise Plan  no updates this session    Consulted and Agree with Plan of Care  Patient;Family member/caregiver    Family Member Consulted  Wife       Patient will benefit from skilled therapeutic intervention in order to improve the following deficits and impairments:  Abnormal gait, Decreased activity tolerance, Decreased cognition,  Decreased endurance, Decreased knowledge of use of DME, Decreased range of motion, Decreased skin integrity, Decreased strength, Impaired perceived functional ability, Impaired sensation, Impaired UE functional use, Improper body mechanics, Pain, Cardiopulmonary status limiting activity, Decreased balance, Decreased coordination, Decreased mobility, Difficulty walking, Impaired tone, Postural dysfunction, Dizziness  Visit Diagnosis: Muscle weakness (generalized)  Other lack of coordination  Difficulty in walking, not elsewhere classified  Repeated falls     Problem List Patient Active Problem List   Diagnosis Date Noted  . Sepsis ([place]) 10/18/2018    Isaia Hassell PT, DPT 06/26/2019, 3:21 PM  [place] MAIN [facility] SERVICES [address] [place], [place], [address] Phone: [phone]   Fax:  [phone]  Name: Kenneth Summers MRN: [id number] Date of Birth: May 09, 1945

## 2019-06-30 ENCOUNTER — Ambulatory Visit: Payer: Medicare Other | Admitting: Occupational Therapy

## 2019-06-30 ENCOUNTER — Other Ambulatory Visit: Payer: Self-pay

## 2019-06-30 ENCOUNTER — Encounter: Payer: Self-pay | Admitting: Physical Therapy

## 2019-06-30 ENCOUNTER — Ambulatory Visit: Payer: Medicare Other | Admitting: Physical Therapy

## 2019-06-30 ENCOUNTER — Encounter: Payer: Self-pay | Admitting: Occupational Therapy

## 2019-06-30 DIAGNOSIS — M6281 Muscle weakness (generalized): Secondary | ICD-10-CM

## 2019-06-30 DIAGNOSIS — I69354 Hemiplegia and hemiparesis following cerebral infarction affecting left non-dominant side: Secondary | ICD-10-CM

## 2019-06-30 DIAGNOSIS — R278 Other lack of coordination: Secondary | ICD-10-CM

## 2019-06-30 DIAGNOSIS — R262 Difficulty in walking, not elsewhere classified: Secondary | ICD-10-CM

## 2019-06-30 DIAGNOSIS — R296 Repeated falls: Secondary | ICD-10-CM

## 2019-06-30 DIAGNOSIS — H547 Unspecified visual loss: Secondary | ICD-10-CM

## 2019-06-30 NOTE — Therapy (Signed)
[place] [facility]IN [facility] SERVICES [address] [place], [place], [address] Phone: [phone]   Fax:  [phone]  Occupational Therapy Treatment  Patient Details  Name: Kenneth Summers MRN: [id number] Date of Birth: Aug 14, 1945 No data recorded  Encounter Date: 06/30/2019  OT End of Session - 06/30/19 1653    Visit Number  115    Number of Visits  157    Date for OT Re-Evaluation  09/15/19    Authorization Type  Progress report period starting 12/26/2018    OT Start Time  1345    OT Stop Time  1428    OT Time Calculation (min)  43 min    Activity Tolerance  Patient tolerated treatment well;Patient limited by fatigue    Behavior During Therapy  [facility] for tasks assessed/performed       Past Medical History:  Diagnosis Date  . Actinic keratosis   . Alcohol abuse   . APS (antiphospholipid syndrome) ([place])   . Basal cell carcinoma   . BPH (benign prostatic hyperplasia)   . CAD (coronary artery disease)   . Cellulitis   . Cervical spinal stenosis   . Chronic osteomyelitis ([place])   . CKD (chronic kidney disease)   . CKD (chronic kidney disease)   . Clostridium difficile diarrhea   . Depression   . Diplopia   . Fatigue   . GERD (gastroesophageal reflux disease)   . Hyperlipemia   . Hypertension   . Hypovitaminosis D   . IBS (irritable bowel syndrome)   . 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 Insomnia   . Left lumbosacral radiculopathy   . Moderate obstructive sleep apnea   . Osteomyelitis of foot ([place])   . PAD (peripheral artery disease) ([place])   . Pruritus   . RA (rheumatoid arthritis) ([place])   . Radiculopathy   . Restless legs syndrome   . RLS (restless legs syndrome)   . SLE (systemic lupus erythematosus related syndrome) ([place])   . 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 Stroke ([place])   . Toxic maculopathy of both eyes     Past Surgical History:  Procedure Laterality Date  . CARDIAC CATHETERIZATION    .  CAROTID ENDARTERECTOMY    . CERVICAL LAMINECTOMY    . CORONARY ANGIOPLASTY    . FRACTURE SURGERY    . fusion C5-6-7    . HERNIA REPAIR    . KNEE ARTHROSCOPY    . TONSILLECTOMY      There were no vitals filed for this visit.  Subjective Assessment - 06/30/19 1651    Subjective   Pt. reports no pain today.    Patient is accompanied by:  Family member    Pertinent History  Pt. is a 74 y.o. 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 Pt. PMHx includes: Multiple Falls, Lupus, DJD, Renal Abscess, CVA. Pt. resides with his wife. Pt.'s wife and daughter assist with ADLs. Pt. has caregivers in  for 2 hours a day, 6 days a week. Pt. received Rehab services in acute care, at SNF for STR, and [facility] services. Pt. is retired from [employer] for [employer] and [employer].    Patient Stated Goals  To regain use of his left UE, and do more for himself.    Currently in Pain?  No/denies          OT Treatment  Therapeutic Activity: OT facilitates  pt participation in peg pattern design task to improve visual scanning, trunk flexion to reach and place pegs, as well as L hand [place]. Pt requires 6 verbal cues for correct peg placement to complete pattern (of 25 pegs used for top right quadrant of pattern). OT facilitates MOD verbal cues for pt to utilize L hand versus R to complete task and to flex trunk to reach furthest spaces. OT then facilitates pt participation in reaching, grasping, and flipping [place] style discs. Pt worked on reaching to the far left, and far right with trunk rotation when flipping 30 discs lined up into 3 rows of 10 discs. Pt worked on flipping the the disc simulateously with bilateral hands. Pt worked on using his left hand to stack the discs in groups of 3, and place them into the case. Pt had difficulty with using his left hand to stack the discs, and well as incorporating the left hand during bilateral hand coordination. Increased, and consistent  cues were required to use his left hand throughout the task. Lastly, OT engaged pt in pincer grasp activity with light resistance clothes pins clipped to a top. Pt with difficulty using pincer grasp pattern to retrieve pin from clothing and requires MOD verbal cues to not utilize table or R hand. OT does encourage use of R hand to stabilize. Pt with light tremor pattern present when attempting to pinch and remove the pin, but is successful on 4/10 trials.    Clinical Impression Pt presents today for OT treatment in outpatient setting. Pt endorses feeling somewhat fatigued this date. States that he stayed up watching American Idol last night and thinks that made him sleepy today. He is pleasant throughout session. OT treatment consists of fine motor coordination, visual scanning, and trunk control/stabilization tasks. Pt participatory throughout, but requires MOD verbal/tactile cues to utilize L hand with fine motor tasks as well as to use trunk flexion to lean/reach to obtain items. OT facilitates pt education re: use of L hand in daily tasks at home to implement carryover outside of therapy. Pt with moderate reception. Pt's spouse present and demos good reception. Pt tolerates session well overall. Plan to continue to engage pt in use of L UE to increase coordination and functional use for ADLs.                 OT Education - 06/30/19 1653    Education Details  Left UE functioning, left sided awareness.    Person(s) Educated  Patient    Methods  Explanation;Demonstration    Comprehension  Returned demonstration;Tactile cues required;Verbalized understanding;Need further instruction          OT Long Term Goals - 06/23/19 1414      OT LONG TERM GOAL #1   Title  Pt. will increase left shoulder flexion AROM by 10 degrees to access cabinet/shelf.    Baseline  Shoulder flexion:130. Pt. is progressing, and continues to work on reaching with increasing emphasis on flexing trunk to  improve functional reach.    Time  12    Period  Weeks    Status  Partially Met    Target Date  09/15/19      OT LONG TERM GOAL #2   Title  Pt. will donn a shirt with Supervision.    Baseline  Pt. continues to require MinA donning a zip down jacket with consistent cues to initiate the correct direction of the jacket.    Time  12    Status  On-going    Target Date  09/15/19      OT LONG TERM GOAL #4   Title  Pt. will improve left grip strength by 10# to be able to open a jar/container.    Baseline  Pt. continues to work towards opening containers, and bottles.    Time  12    Period  Weeks    Status  On-going    Target Date  09/15/19      OT LONG TERM GOAL #5   Title  Pt. will improve left hand [facility] skills to be able to assist with buttoning/zipping.    Baseline  Pt. is improving with manipulating the zipper on his jacket, however continues to require practice. Pt. continues to work on improving bilateral [facility] skills for buttoning/zipping    Time  12    Period  Weeks    Status  On-going    Target Date  09/15/19      OT LONG TERM GOAL #6   Title  Pt. will demonstrate visual conmpensatory strategies for 100% of the time during ADLs    Baseline  Pt. requires  fewer cues for left sides awareness.    Time  12    Period  Weeks    Status  Partially Met    Target Date  09/15/19      OT LONG TERM GOAL #8   Title  Pt. will accurately identify potential safety hazard using good safety awareness, and judgement 100% for ADLs, and IADLs.    Baseline  Pt. requires cues    Time  12    Period  Weeks    Status  New    Target Date  09/15/19      OT LONG TERM GOAL  #9   TITLE  Pt. will  navigate his w/c around obstacles with Supervision and 100% accuracy    Baseline  Pt. continues to require cuing for obstacle on the left, however requires less cues.    Time  12    Period  Weeks    Status  On-going    Target Date  09/15/19      OT LONG TERM GOAL  #11   TITLE  Pt. will increase BUE  strength by 41m grades in preparation for ADL transfers.    Baseline  Pt. is improving with LUE strength    Time  12    Period  Weeks    Status  On-going    Target Date  09/15/19      OT LONG TERM GOAL  #12   TITLE  Pt. will improve LUE functional reaching with the left engaging the trunk 100% of the time during ADL tasks with minimal cues.    Baseline  06/23/2019: Pt. is improving with LUE functional reachin, howwver requires cues to engage trunk for extended reaching.    Time  12    Period  Weeks    Status  On-going    Target Date  09/15/19            Plan - 06/30/19 1655    OT Occupational Profile and History  Problem Focused Assessment - Including review of records relating to presenting problem    Occupational performance deficits (Please refer to evaluation for details):  ADL's    Body Structure / Function / Physical Skills  UE functional use;Coordination;FMC;Dexterity;Strength;ROM    Cognitive Skills  Attention;Memory;Emotional;Problem Solve;Safety Awareness    Rehab Potential  Good    Clinical Decision Making  Several treatment options, min-mod task modification necessary    Comorbidities Affecting Occupational Performance:  Presence of comorbidities impacting occupational performance    Comorbidities impacting occupational performance description:  Phyical, cognitive, visual,  medical comorbidities    Modification or Assistance to Complete Evaluation   No modification of tasks or assist necessary to complete eval    OT Frequency  2x / week    OT Duration  12 weeks    OT Treatment/Interventions  Self-care/ADL training;DME and/or AE instruction;Therapeutic exercise;Therapeutic activities;Moist Heat;Cognitive remediation/compensation;Neuromuscular education;Visual/perceptual remediation/compensation;Coping strategies training;Patient/family education;Passive range of motion;Psychosocial skills training;Energy conservation;Functional Mobility Training    Recommended Other  Services  PT    Consulted and Agree with Plan of Care  Patient       Patient will benefit from skilled therapeutic intervention in order to improve the following deficits and impairments:   Body Structure / Function / Physical Skills: UE functional use, Coordination, FMC, Dexterity, Strength, ROM Cognitive Skills: Attention, Memory, Emotional, Problem Solve, Safety Awareness     Visit Diagnosis: Muscle weakness (generalized)  Other lack of coordination  Hemiplegia and hemiparesis following cerebral infarction affecting left non-dominant side ([place])  Vision impairment    Problem List Patient Active Problem List   Diagnosis Date Noted  . Sepsis ([place]) 10/18/2018    Sharren Bridge 06/30/2019, 5:23 PM  [place] [facility] SERVICES [address] [place], [place], [address] Phone: [phone]   Fax:  [phone]  Name: WESLY WHISENANT MRN: [id number] Date of Birth: [birth year]

## 2019-06-30 NOTE — Therapy (Signed)
[place] MAIN [facility] SERVICES [address] [place], [place], [address] Phone: [phone]   Fax:  [phone]  Physical Therapy Treatment Physical Therapy Progress Note   Dates of reporting period  05/28/19   to   06/30/19   Patient Details  Name: Kenneth Summers MRN: [id number] Date of Birth: 05-Dec-1945 No data recorded  Encounter Date: 06/30/2019  PT End of Session - 06/30/19 1306    Visit Number  140    Number of Visits  180    Date for PT Re-Evaluation  08/20/19    Authorization Type  Medicare reporting period starting 09/11/18    Authorization Time Period  goals last assessed 05/26/19    PT Start Time  1302    PT Stop Time  1345    PT Time Calculation (min)  43 min    Equipment Utilized During Treatment  Gait belt    Activity Tolerance  Patient tolerated treatment well;No increased pain    Behavior During Therapy  WFL for tasks assessed/performed       Past Medical History:  Diagnosis Date  . Actinic keratosis   . Alcohol abuse   . APS (antiphospholipid syndrome) ([place])   . Basal cell carcinoma   . BPH (benign prostatic hyperplasia)   . CAD (coronary artery disease)   . Cellulitis   . Cervical spinal stenosis   . Chronic osteomyelitis ([place])   . CKD (chronic kidney disease)   . CKD (chronic kidney disease)   . Clostridium difficile diarrhea   . Depression   . Diplopia   . Fatigue   . GERD (gastroesophageal reflux disease)   . Hyperlipemia   . Hypertension   . Hypovitaminosis D   . IBS (irritable bowel syndrome)   . 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 Insomnia   . Left lumbosacral radiculopathy   . Moderate obstructive sleep apnea   . Osteomyelitis of foot ([place])   . PAD (peripheral artery disease) ([place])   . Pruritus   . RA (rheumatoid arthritis) ([place])   . Radiculopathy   . Restless legs syndrome   . RLS (restless legs syndrome)   . SLE (systemic lupus erythematosus related syndrome) ([place])   . Squamous cell carcinoma of  skin 01/27/2019   right mid lower ear helix  . Stroke ([place])   . Toxic maculopathy of both eyes     Past Surgical History:  Procedure Laterality Date  . CARDIAC CATHETERIZATION    . CAROTID ENDARTERECTOMY    . CERVICAL LAMINECTOMY    . CORONARY ANGIOPLASTY    . FRACTURE SURGERY    . fusion C5-6-7    . HERNIA REPAIR    . KNEE ARTHROSCOPY    . TONSILLECTOMY      There were no vitals filed for this visit.  Subjective Assessment - 06/30/19 1328    Subjective  Patient reports increased fatigue today; He reports not sleeping well at all last night. Reports that he does feel like he is having better mental clarity; He also presents with new skin tear LUE after running into wall with power chair;    Patient is accompained by:  Family member    Pertinent History  Patient is a 74 y.o. male who presents to outpatient physical therapy with a referral for medical diagnosis of CVA. This patient's chief complaints consist of left hemiplegia and overall deconditioning leading to the following functional deficits: dependent for ADLs, IADLs, unable to transfer to car, dependent transfers at  home with hoyer lift, unable to walk or stand without significant assistance, difficulty with W/C navigation..    Limitations  Sitting;Lifting;Standing;House hold activities;Writing;Walking;Other (comment)    Currently in Pain?  No/denies    Multiple Pain Sites  No          TREATMENT:  Prior to transfersand in between transfers: Patient sittingin wheelchair: -hip flexion [id number] bilaterally Ptrequires verbal and visual cues to improve ROM;He was able to exhibit better trunk control but does have difficulty increasing LLE AROM this session;  Following exercise: Transferred sit<>Stand in parallel bars (seat adjusted to 27 inch from floor height) x8reps throughout session;He was able to transfer min A toclose supervisionwithminVCs for forward weight shift for better trunk control and  transfer ability;However last 2 transfers he required min A due to fatigue and difficulty with forward weight shift;   Standing in parallel bars: Standing,CGA to close supervision with min VCs for weight shift and increased extension for better stance control; -weight shift side/side x10 reps withCGA and min VCs for erect posture and increased LLE knee extension in stance; -Mini squat with B rail assist x10reps with cues to increase knee extension in BLE between repetitions; Patient able to exhibit good quad activation in each LE with squats; -weight shift to neutral, with cues for hip extensionand to shift to neutral without heavy right lateral lean Progressed to reducing rail assist from 2-1-0intermittent rail assist x5-10sec x3 setsto facilitate increased weight bearing in BLE and to improve LE weight acceptance.   Patient initially exhibits flexed posture with decreased hip extension; He was able to exhibit improved hip extension during subsequent transfers; Although he continues to require min A for safety and trunk control; With increased fatigue he exhibits heavy posterior lean having difficulty maintaining standing position often sitting quickly due to LE fatigue and fear of falling;   Patient required modA to scoot back in chair at end of session due to increased fatigue  Response to treatment: Toleratedfair. Patient initially was able to exhibit better standing posture and positioning but fatigues quickly. He had a hard time achieving hip extension in stance.  He exhibits increased shortness of breath with subsequent transfers due to fatigue and decreased cardiovascular endurance;    [name] condition has the potential to improve in response to therapy. Maximum improvement is yet to be obtained. The anticipated improvement is attainable and reasonable in a generally predictable time.  Patient reports adherence with HEP; He is still unable to stand at  home because of fear of falling and not trusting caregiver to assist with transfers. Patient is 6'4" and his wife and daughter are 5'2" which makes transfers significantly difficult.                        PT Education - 06/30/19 1306    Education Details  transfers/weight shift, LE strengthening, HEP    Person(s) Educated  Patient;Child(ren)    Methods  Explanation;Verbal cues    Comprehension  Verbalized understanding;Returned demonstration;Verbal cues required;Need further instruction       PT Short Term Goals - 05/28/19 0717      PT SHORT TERM GOAL #1   Title  Be independent with initial home exercise program for self-management of symptoms.    Baseline  HEP to be given at second session; advice to practice unsupported sitting at home (06/19/2018); 07/08/18: Performing at home, 6/29: needs assistance but is doing exercise program regularly; 08/22/18: adherent;    Time  2  Period  Weeks    Status  Achieved    Target Date  06/26/18        PT Long Term Goals - 06/30/19 1330      PT LONG TERM GOAL #1   Title  Patient will complete all bed mobility with min A to improve functional independence for getting in and out of bed and adjusting in bed.     Baseline  max A - total A reported by family (06/19/2018); 07/08/18: still requires maxA, dtr reports improvement in rolling since starting therapy; 6/23: min A to supervision in clinic; not doing at home right; 8/5: Pt's daughter states that his rolling has improved, but still has difficulty with all other bed mobility; 10/23/18: pts daughter states that she is doing about 35% of the work if he is in proper position, he does uses the bed rails to help roll;  using Hoyer for coming to sit EOB; 10/12: pts daughter states that she is doing about 30% of the work if he is in proper position, he does uses the bed rails to help roll, but it is improving;  using Hoyer for coming to sit EOB, 10/29: min A for sit to sidelying, rolling  supervision, able to transition sidelying to sit with HHA;    Time  12    Period  Weeks    Status  Achieved      PT LONG TERM GOAL #2   Title  Patient will complete sit <> stand transfer chair to chair with Min A and LRAD to improve functional independence for household and community mobility.     Baseline  supervision for STS, still requires elevated surface height to 27"    Time  12    Period  Weeks    Status  Achieved      PT LONG TERM GOAL #3   Title  Patient will navigate power w/c with min A x 100 feet to improve mobility for household and short community distances.    Baseline  required total A (06/12/2018); able to roll 20 feet with min A (06/20/2018); 07/08/18: Pt can go approximately 10' without assist per family;  10/23/2018: pt's daughter states that his power WC mobility has become 100% better with the new hand control, slowed speed, and with practice; 10/12: pt's daughter reports that he can perform household WC mobility and limited community ambulation with minA    Time  12    Period  Weeks    Status  Achieved      PT LONG TERM GOAL #4   Title  Patient will complete car transfers W/C to family SUV with min A using LRAD to improve his abilty to participate in community activities.     Baseline  unable to complete car transfers at all (06/12/2018); unable to complete car transfers at all (06/19/2018).; 07/08/18: Unable to perform at this time; 09/11/18: unable to perform at this time; 10/23/2018: unable to complete car transfers at all; 10/12:  unable to complete car transfers at all, 10/29: unable; 01/13/19: unable; 2/11: min-mod A +2, 3/10: min A+1    Time  12    Period  Weeks    Status  Achieved      PT LONG TERM GOAL #5   Title  Patient will ambulate at least 10 feet with min A using LRAD to improve mobility for household and community distances.    Baseline  unable to take steps (06/12/2018); able to shift R leg forward and  back with max A and RW at edge of plinth (06/19/2018); 07/08/18:  unable to ambulate at this time. 09/11/18: unable to perform at this time; 10/23/2018: unable to perform at this time; 10/12: unable to perform at this time, 10/29: unable at this time; 01/13/19: unable at this time, 2/11: min A to step with RLE, unable to step with LLE, 3/10: able to take 2 steps in parallel bars with min A for safety; 4/1: min A for walking 10 feet in parallel bars, 5/24: min A walking 10 feet in parallel bars;    Time  12    Period  Weeks    Status  Partially Met    Target Date  08/20/19      PT LONG TERM GOAL #6   Title  Patient will be able to safety navigate up/down ramp with power chair, exhibiting good safety awareness, mod I to safely enter/exit his home.    Baseline  2/11: supervision with intermittent min A;4/1: supervision; 5/24: supervision    Time  12    Period  Weeks    Status  Partially Met    Target Date  08/20/19      PT LONG TERM GOAL #7   Title  Patient will increase functional reach test to >15 inches in sitting to exhibit improved sitting balance and positioning and reduce fall risk;    Baseline  07/30/18: 12 inches; 09/11/18: 10-12 inches; 10/23/18: 12-13 inch; 10/12: 14.5", 10/29: 15.5 inch    Time  4    Period  Weeks    Status  Achieved      PT LONG TERM GOAL #8   Title  Patient will increase LLE quad strength to 3+/5 to improve functional strength for standing and mobility;    Baseline  10/28: 2+/5; 01/13/19: 2+/5, 2/11: 3-/5, 04/16/19: 3-/5, 05/08/19: 3-/5, 5/24: 3-/5    Time  12    Period  Weeks    Status  Partially Met    Target Date  08/20/19      PT LONG TERM GOAL  #9   TITLE  Patient will report no vertigo with provoking motions or positions in order to be able to perform ADLs and mobility tasks without symptoms.    Baseline  06/30/19: reports no dizziness in last week;    Time  12    Period  Weeks    Status  New    Target Date  08/20/19            Plan - 06/30/19 1347    Clinical Impression Statement  Patient motivated and participated  fair within session. He was heavily fatigued requiring increased seated rest breaks. Patient challenged with less full rest breaks and intermittent seated forward in chair rest breaks to challenge trunk control. He continues to require cues for erect standing posture to improve hip extension in stance. Patient able to progress standing balance with less rail assist in standing facilitating increased weight bearing in LE for strengthening. He would benefit from additional skilled PT intervention to improve strength, balance and mobility;    Personal Factors and Comorbidities  Age;Comorbidity 3+    Comorbidities  Relevant past medical history and comorbidities include long term steroid use for lupus, CKD, chronic osteomyelitis, cervical spine stenosis, BPH, APS, alcohol abuse, peripheral artery disease, depression, GERD, obstructive sleep apnea, HTN, IBS, IgA deficiency, lumbar radiculopathy, RA, Stroke, toxic maculopathy in both eyes, systemic lupus erythematosus related syndrome, cardiac catheterization, carotid endarterectomy, cervical laminectomy, coronary angioplasty, Fusion C5-C7,  knee arthroplasty, lumbar surgery.    Examination-Activity Limitations  Bed Mobility;Bend;Sit;Toileting;Stand;Stairs;Lift;Transfers;Squat;Locomotion Level;Carry;Dressing;Hygiene/Grooming;Continence    Examination-Participation Restrictions  Yard Work;Interpersonal Relationship;Community Activity    Stability/Clinical Decision Making  Evolving/Moderate complexity    Rehab Potential  Fair    PT Frequency  3x / week    PT Duration  12 weeks    PT Treatment/Interventions  ADLs/Self Care Home Management;Electrical Stimulation;Moist Heat;Cryotherapy;Gait training;Stair training;Functional mobility training;Therapeutic exercise;Balance training;Neuromuscular re-education;Cognitive remediation;Patient/family education;Orthotic Fit/Training;Wheelchair mobility training;Manual techniques;Passive range of motion;Energy conservation;Joint  Manipulations;Canalith Repostioning;Therapeutic activities;Vestibular    PT Next Visit Plan  Work on strength, sitting/standing balance, functional activities such as rolling, bed mobility, transfers; caregiver training    PT Home Exercise Plan  no updates this session    Consulted and Agree with Plan of Care  Patient;Family member/caregiver    Family Member Consulted  Wife       Patient will benefit from skilled therapeutic intervention in order to improve the following deficits and impairments:  Abnormal gait, Decreased activity tolerance, Decreased cognition, Decreased endurance, Decreased knowledge of use of DME, Decreased range of motion, Decreased skin integrity, Decreased strength, Impaired perceived functional ability, Impaired sensation, Impaired UE functional use, Improper body mechanics, Pain, Cardiopulmonary status limiting activity, Decreased balance, Decreased coordination, Decreased mobility, Difficulty walking, Impaired tone, Postural dysfunction, Dizziness  Visit Diagnosis: Muscle weakness (generalized)  Other lack of coordination  Difficulty in walking, not elsewhere classified  Repeated falls     Problem List Patient Active Problem List   Diagnosis Date Noted  . Sepsis ([place]) 10/18/2018    Shivali Quackenbush PT, DPT 06/30/2019, 2:32 PM   [place] MAIN [facility] SERVICES [address] [place], [place], [address] Phone: [phone]   Fax:  [phone]  Name: AARIK BLANK MRN: [id number] Date of Birth: [birth year]

## 2019-07-02 ENCOUNTER — Encounter: Payer: Self-pay | Admitting: Physical Therapy

## 2019-07-02 ENCOUNTER — Ambulatory Visit: Payer: Medicare Other | Admitting: Physical Therapy

## 2019-07-02 ENCOUNTER — Other Ambulatory Visit: Payer: Self-pay

## 2019-07-02 DIAGNOSIS — M6281 Muscle weakness (generalized): Secondary | ICD-10-CM

## 2019-07-02 DIAGNOSIS — R262 Difficulty in walking, not elsewhere classified: Secondary | ICD-10-CM

## 2019-07-02 DIAGNOSIS — R278 Other lack of coordination: Secondary | ICD-10-CM

## 2019-07-02 DIAGNOSIS — R296 Repeated falls: Secondary | ICD-10-CM

## 2019-07-02 NOTE — Therapy (Signed)
[place] MAIN [facility] SERVICES [address] [place], [place], [address] Phone: [phone]   Fax:  [phone]  Physical Therapy Treatment  Patient Details  Name: Kenneth Summers MRN: [id number] Date of Birth: [birth year] No data recorded  Encounter Date: 07/02/2019  PT End of Session - 07/02/19 1302    Visit Number  141    Number of Visits  180    Date for PT Re-Evaluation  08/20/19    Authorization Type  Medicare reporting period starting 09/11/18    Authorization Time Period  goals last assessed 05/26/19    PT Start Time  1302    PT Stop Time  1340    PT Time Calculation (min)  38 min    Equipment Utilized During Treatment  Gait belt    Activity Tolerance  Patient tolerated treatment well;No increased pain    Behavior During Therapy  WFL for tasks assessed/performed       Past Medical History:  Diagnosis Date  . Actinic keratosis   . Alcohol abuse   . APS (antiphospholipid syndrome) ([place])   . Basal cell carcinoma   . BPH (benign prostatic hyperplasia)   . CAD (coronary artery disease)   . Cellulitis   . Cervical spinal stenosis   . Chronic osteomyelitis ([place])   . CKD (chronic kidney disease)   . CKD (chronic kidney disease)   . Clostridium difficile diarrhea   . Depression   . Diplopia   . Fatigue   . GERD (gastroesophageal reflux disease)   . Hyperlipemia   . Hypertension   . Hypovitaminosis D   . IBS (irritable bowel syndrome)   . 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 Insomnia   . Left lumbosacral radiculopathy   . Moderate obstructive sleep apnea   . Osteomyelitis of foot ([place])   . PAD (peripheral artery disease) ([place])   . Pruritus   . RA (rheumatoid arthritis) ([place])   . Radiculopathy   . Restless legs syndrome   . RLS (restless legs syndrome)   . SLE (systemic lupus erythematosus related syndrome) ([place])   . 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 Stroke ([place])   . Toxic maculopathy of both  eyes     Past Surgical History:  Procedure Laterality Date  . CARDIAC CATHETERIZATION    . CAROTID ENDARTERECTOMY    . CERVICAL LAMINECTOMY    . CORONARY ANGIOPLASTY    . FRACTURE SURGERY    . fusion C5-6-7    . HERNIA REPAIR    . KNEE ARTHROSCOPY    . TONSILLECTOMY      There were no vitals filed for this visit.  Subjective Assessment - 07/02/19 1459    Subjective  Patient reports feeling well today; Denies any pain; Reports having increased shakiness in legs this AM requiring gabapentin; Presents to therapy sitting to right side in power chair with slight unsteadiness;    Patient is accompained by:  Family member    Pertinent History  Patient is a 74 y.o. male who presents to outpatient physical therapy with a referral for medical diagnosis of CVA. This patient's chief complaints consist of left hemiplegia and overall deconditioning leading to the following functional deficits: dependent for ADLs, IADLs, unable to transfer to car, dependent transfers at home with hoyer lift, unable to walk or stand without significant assistance, difficulty with W/C navigation..    Limitations  Sitting;Lifting;Standing;House hold activities;Writing;Walking;Other (comment)    Currently  in Pain?  No/denies              TREATMENT:  Prior to transfersand in between transfers: Patient sittingin wheelchair: -hip flexion [id number] bilaterally Seated arms across chest, weight shift to netural, forward/backward weight shift x5 reps;  Ptrequires verbal and visual cues to improve ROM;he had a harder time with trunk control with heavy right side lean;   Following exercise: Transferred sit<>Stand in parallel bars (seat adjusted to 27 inch from floor height) x4reps throughout session;He was able to transfer min A modVCs for forward weight shift for better trunk control and transfer ability;  Standing in parallel bars: Standing,min A with min VCs for weight shift and increased  extension for better stance control; -weight shift side/side x5 reps withCGA and min VCs for erect posture and increased LLE knee extension in stance; -weight shift to neutral, with cues for hip extensionand to shift to neutral without heavy right lateral lean Progressed to reducing rail assist from 2-1 intermittent rail assist x5-10sec x1setsto facilitate increased weight bearing in BLE and to improve LE weight acceptance.   Patient initially exhibits flexed posture with decreased hip extension; he also exhibits heavy lean to right side requiring instruction for weight shift to neutral position;   During session, patient remained awake but became unresponsive not answering 3-4 questions. He was starring off into space. Concerned he was experiencing a small seizure. Patient reports mild lightheadedness but then reports moderate to severe fatigue;   Patient requiredmodA to scoot back in chair at end of session due to increased fatigue  Response to treatment: Toleratedfair. Patient heavily fatigued at end of session. Session ended early due to concern of possible seizure and new onset fatigue and concern of worsening symptoms. Educated patient/caregiver on importance of resting to reduce risk for progressive weakness;                      PT Education - 07/02/19 1302    Education Details  transfers, weight shift, LE strengthening, HEP    Person(s) Educated  Patient    Methods  Explanation;Verbal cues    Comprehension  Verbalized understanding;Returned demonstration;Verbal cues required;Need further instruction       PT Short Term Goals - 05/28/19 0717      PT SHORT TERM GOAL #1   Title  Be independent with initial home exercise program for self-management of symptoms.    Baseline  HEP to be given at second session; advice to practice unsupported sitting at home (06/19/2018); 07/08/18: Performing at home, 6/29: needs assistance but is doing exercise  program regularly; 08/22/18: adherent;    Time  2    Period  Weeks    Status  Achieved    Target Date  06/26/18        PT Long Term Goals - 06/30/19 1330      PT LONG TERM GOAL #1   Title  Patient will complete all bed mobility with min A to improve functional independence for getting in and out of bed and adjusting in bed.     Baseline  max A - total A reported by family (06/19/2018); 07/08/18: still requires maxA, dtr reports improvement in rolling since starting therapy; 6/23: min A to supervision in clinic; not doing at home right; 8/5: Pt's daughter states that his rolling has improved, but still has difficulty with all other bed mobility; 10/23/18: pts daughter states that she is doing about 35% of the work if he is in proper  position, he does uses the bed rails to help roll;  using Harrel Lemon for coming to sit EOB; 10/12: pts daughter states that she is doing about 30% of the work if he is in proper position, he does uses the bed rails to help roll, but it is improving;  using Hoyer for coming to sit EOB, 10/29: min A for sit to sidelying, rolling supervision, able to transition sidelying to sit with HHA;    Time  12    Period  Weeks    Status  Achieved      PT LONG TERM GOAL #2   Title  Patient will complete sit <> stand transfer chair to chair with Min A and LRAD to improve functional independence for household and community mobility.     Baseline  supervision for STS, still requires elevated surface height to 27"    Time  12    Period  Weeks    Status  Achieved      PT LONG TERM GOAL #3   Title  Patient will navigate power w/c with min A x 100 feet to improve mobility for household and short community distances.    Baseline  required total A (06/12/2018); able to roll 20 feet with min A (06/20/2018); 07/08/18: Pt can go approximately 10' without assist per family;  10/23/2018: pt's daughter states that his power WC mobility has become 100% better with the new hand control, slowed speed, and with  practice; 10/12: pt's daughter reports that he can perform household WC mobility and limited community ambulation with minA    Time  12    Period  Weeks    Status  Achieved      PT LONG TERM GOAL #4   Title  Patient will complete car transfers W/C to family SUV with min A using LRAD to improve his abilty to participate in community activities.     Baseline  unable to complete car transfers at all (06/12/2018); unable to complete car transfers at all (06/19/2018).; 07/08/18: Unable to perform at this time; 09/11/18: unable to perform at this time; 10/23/2018: unable to complete car transfers at all; 10/12:  unable to complete car transfers at all, 10/29: unable; 01/13/19: unable; 2/11: min-mod A +2, 3/10: min A+1    Time  12    Period  Weeks    Status  Achieved      PT LONG TERM GOAL #5   Title  Patient will ambulate at least 10 feet with min A using LRAD to improve mobility for household and community distances.    Baseline  unable to take steps (06/12/2018); able to shift R leg forward and back with max A and RW at edge of plinth (06/19/2018); 07/08/18: unable to ambulate at this time. 09/11/18: unable to perform at this time; 10/23/2018: unable to perform at this time; 10/12: unable to perform at this time, 10/29: unable at this time; 01/13/19: unable at this time, 2/11: min A to step with RLE, unable to step with LLE, 3/10: able to take 2 steps in parallel bars with min A for safety; 4/1: min A for walking 10 feet in parallel bars, 5/24: min A walking 10 feet in parallel bars;    Time  12    Period  Weeks    Status  Partially Met    Target Date  08/20/19      PT LONG TERM GOAL #6   Title  Patient will be able to safety navigate up/down ramp  with power chair, exhibiting good safety awareness, mod I to safely enter/exit his home.    Baseline  2/11: supervision with intermittent min A;4/1: supervision; 5/24: supervision    Time  12    Period  Weeks    Status  Partially Met    Target Date  08/20/19      PT  LONG TERM GOAL #7   Title  Patient will increase functional reach test to >15 inches in sitting to exhibit improved sitting balance and positioning and reduce fall risk;    Baseline  07/30/18: 12 inches; 09/11/18: 10-12 inches; 10/23/18: 12-13 inch; 10/12: 14.5", 10/29: 15.5 inch    Time  4    Period  Weeks    Status  Achieved      PT LONG TERM GOAL #8   Title  Patient will increase LLE quad strength to 3+/5 to improve functional strength for standing and mobility;    Baseline  10/28: 2+/5; 01/13/19: 2+/5, 2/11: 3-/5, 04/16/19: 3-/5, 05/08/19: 3-/5, 5/24: 3-/5    Time  12    Period  Weeks    Status  Partially Met    Target Date  08/20/19      PT LONG TERM GOAL  #9   TITLE  Patient will report no vertigo with provoking motions or positions in order to be able to perform ADLs and mobility tasks without symptoms.    Baseline  06/30/19: reports no dizziness in last week;    Time  12    Period  Weeks    Status  New    Target Date  08/20/19            Plan - 07/02/19 1500    Clinical Impression Statement  Patient tolerated session fair. he was instructed in sit<>stand transfers in parallel bars. He was slow to initiate forward lean and had difficulty with proper mechanics with transfers. patient required min A for most sit<>Stand transfers this session. upon standing, he exhibits heavy posterior lean requiring cues for better trunk control and weight shift. During session, patient became unresponsive, starring off into space. He was awake but was not answering questions. Concerned patient was experiencing a small seizure. Following episode he complained of severe fatigue having difficulty with transfers and exercise. PT session cut short due to fear of seizure and patient experiencing significant fatigue and not wanting to contribute to overall weakness. Educated patient on importance of rest to reduce neurofatigue. Patient would benefit from additional skilled PT intervention to improve strength,  balance and mobility;    Personal Factors and Comorbidities  Age;Comorbidity 3+    Comorbidities  Relevant past medical history and comorbidities include long term steroid use for lupus, CKD, chronic osteomyelitis, cervical spine stenosis, BPH, APS, alcohol abuse, peripheral artery disease, depression, GERD, obstructive sleep apnea, HTN, IBS, IgA deficiency, lumbar radiculopathy, RA, Stroke, toxic maculopathy in both eyes, systemic lupus erythematosus related syndrome, cardiac catheterization, carotid endarterectomy, cervical laminectomy, coronary angioplasty, Fusion C5-C7, knee arthroplasty, lumbar surgery.    Examination-Activity Limitations  Bed Mobility;Bend;Sit;Toileting;Stand;Stairs;Lift;Transfers;Squat;Locomotion Level;Carry;Dressing;Hygiene/Grooming;Continence    Examination-Participation Restrictions  Yard Work;Interpersonal Relationship;Community Activity    Stability/Clinical Decision Making  Evolving/Moderate complexity    Rehab Potential  Fair    PT Frequency  3x / week    PT Duration  12 weeks    PT Treatment/Interventions  ADLs/Self Care Home Management;Electrical Stimulation;Moist Heat;Cryotherapy;Gait training;Stair training;Functional mobility training;Therapeutic exercise;Balance training;Neuromuscular re-education;Cognitive remediation;Patient/family education;Orthotic Fit/Training;Wheelchair mobility training;Manual techniques;Passive range of motion;Energy conservation;Joint Manipulations;Canalith Repostioning;Therapeutic activities;Vestibular  PT Next Visit Plan  Work on strength, sitting/standing balance, functional activities such as rolling, bed mobility, transfers; caregiver training    PT Home Exercise Plan  no updates this session    Consulted and Agree with Plan of Care  Patient;Family member/caregiver    Family Member Consulted  Wife       Patient will benefit from skilled therapeutic intervention in order to improve the following deficits and impairments:  Abnormal  gait, Decreased activity tolerance, Decreased cognition, Decreased endurance, Decreased knowledge of use of DME, Decreased range of motion, Decreased skin integrity, Decreased strength, Impaired perceived functional ability, Impaired sensation, Impaired UE functional use, Improper body mechanics, Pain, Cardiopulmonary status limiting activity, Decreased balance, Decreased coordination, Decreased mobility, Difficulty walking, Impaired tone, Postural dysfunction, Dizziness  Visit Diagnosis: Muscle weakness (generalized)  Other lack of coordination  Difficulty in walking, not elsewhere classified  Repeated falls     Problem List Patient Active Problem List   Diagnosis Date Noted  . Sepsis ([place]) 10/18/2018    Larita Deremer PT, DPT 07/02/2019, 3:05 PM  [place] MAIN [facility] SERVICES [address] [place], [place], [address] Phone: [phone]   Fax:  [phone]  Name: MYSHAWN CHIRIBOGA MRN: [id number] Date of Birth: 09/14/1945

## 2019-07-03 ENCOUNTER — Encounter: Payer: Self-pay | Admitting: Occupational Therapy

## 2019-07-03 ENCOUNTER — Other Ambulatory Visit: Payer: Self-pay

## 2019-07-03 ENCOUNTER — Encounter: Payer: Self-pay | Admitting: Physical Therapy

## 2019-07-03 ENCOUNTER — Ambulatory Visit: Payer: Medicare Other | Admitting: Occupational Therapy

## 2019-07-03 ENCOUNTER — Ambulatory Visit: Payer: Medicare Other | Admitting: Physical Therapy

## 2019-07-03 DIAGNOSIS — R296 Repeated falls: Secondary | ICD-10-CM

## 2019-07-03 DIAGNOSIS — R262 Difficulty in walking, not elsewhere classified: Secondary | ICD-10-CM

## 2019-07-03 DIAGNOSIS — M6281 Muscle weakness (generalized): Secondary | ICD-10-CM

## 2019-07-03 DIAGNOSIS — R278 Other lack of coordination: Secondary | ICD-10-CM

## 2019-07-03 NOTE — Therapy (Signed)
[place] MAIN [facility] SERVICES [address] [place], [place], [address] Phone: [phone]   Fax:  [phone]  Physical Therapy Treatment  Patient Details  Name: Kenneth Summers MRN: [id number] Date of Birth: [birth year] No data recorded  Encounter Date: 07/03/2019  PT End of Session - 07/03/19 1409    Visit Number  [id number]    Number of Visits  180    Date for PT Re-Evaluation  08/20/19    Authorization Type  Medicare reporting period starting 09/11/18    Authorization Time Period  goals last assessed 05/26/19    PT Start Time  1347    PT Stop Time  1430    PT Time Calculation (min)  43 min    Equipment Utilized During Treatment  Gait belt    Activity Tolerance  Patient tolerated treatment well;No increased pain    Behavior During Therapy  WFL for tasks assessed/performed       Past Medical History:  Diagnosis Date  . Actinic keratosis   . Alcohol abuse   . APS (antiphospholipid syndrome) ([place])   . Basal cell carcinoma   . BPH (benign prostatic hyperplasia)   . CAD (coronary artery disease)   . Cellulitis   . Cervical spinal stenosis   . Chronic osteomyelitis ([place])   . CKD (chronic kidney disease)   . CKD (chronic kidney disease)   . Clostridium difficile diarrhea   . Depression   . Diplopia   . Fatigue   . GERD (gastroesophageal reflux disease)   . Hyperlipemia   . Hypertension   . Hypovitaminosis D   . IBS (irritable bowel syndrome)   . 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 Insomnia   . Left lumbosacral radiculopathy   . Moderate obstructive sleep apnea   . Osteomyelitis of foot ([place])   . PAD (peripheral artery disease) ([place])   . Pruritus   . RA (rheumatoid arthritis) ([place])   . Radiculopathy   . Restless legs syndrome   . RLS (restless legs syndrome)   . SLE (systemic lupus erythematosus related syndrome) ([place])   . 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 Stroke ([place])   . Toxic maculopathy of both  eyes     Past Surgical History:  Procedure Laterality Date  . CARDIAC CATHETERIZATION    . CAROTID ENDARTERECTOMY    . CERVICAL LAMINECTOMY    . CORONARY ANGIOPLASTY    . FRACTURE SURGERY    . fusion C5-6-7    . HERNIA REPAIR    . KNEE ARTHROSCOPY    . TONSILLECTOMY      There were no vitals filed for this visit.  Subjective Assessment - 07/03/19 1408    Subjective  Patient reports feeling better. he is still tired, but reports no pain;    Patient is accompained by:  Family member    Pertinent History  Patient is a 74 y.o. male who presents to outpatient physical therapy with a referral for medical diagnosis of CVA. This patient's chief complaints consist of left hemiplegia and overall deconditioning leading to the following functional deficits: dependent for ADLs, IADLs, unable to transfer to car, dependent transfers at home with hoyer lift, unable to walk or stand without significant assistance, difficulty with W/C navigation..    Limitations  Sitting;Lifting;Standing;House hold activities;Writing;Walking;Other (comment)    Currently in Pain?  No/denies        TREATMENT:  Patient transferred wheelchair to mat table  min A +1 with stand pivot transfer with mod VCs for hand placement, proper positioning/trunk control;  Upon sitting he often falls backwards requiring cues for forward weight shift;   Patient required min A +1 for transition to hooklying:  Following activity instructed patient in LE exercise:  Hooklying: Bridges with arms by side 2x10 SLR hip flexion x10 reps each LE with min A on LLE, AAROM Hip abduction/ER and adduction to midline LLE only x10 reps with mod VCs for proper exercise technique;  Following exercise:  Pt required mod A +1 for transition sidelying to sitting;  Sitting on elevated mat table (27 inch from floor) Sit<>Stand transfer with RW x4 reps; Patient requires mod VCs for forward weight shift and to increase push through LE and avoid posterior  lean; He also required cues for shifting hips into extension upon standing for better weight shift;  Progressed to sit to stand and stand pivot transfer mat table to wheelchair min A +2 for safety with RW; Patient did have difficulty sliding RW forward requiring assistance for equipment management, but he was able to pivot on RLE well;  Patient tolerated session well. He does report fatigue at end of session; However he was able to exhibit better trunk control with less unsteadiness/lateral lean this session. He also is able to activate LLE better with improved AROM; Patient denies any pain at end of session;                          PT Education - 07/03/19 1408    Education Details  LE exercise, strengthening, posture; HEP    Person(s) Educated  Patient    Methods  Explanation;Verbal cues    Comprehension  Verbalized understanding;Returned demonstration;Verbal cues required;Need further instruction       PT Short Term Goals - 05/28/19 0717      PT SHORT TERM GOAL #1   Title  Be independent with initial home exercise program for self-management of symptoms.    Baseline  HEP to be given at second session; advice to practice unsupported sitting at home (06/19/2018); 07/08/18: Performing at home, 6/29: needs assistance but is doing exercise program regularly; 08/22/18: adherent;    Time  2    Period  Weeks    Status  Achieved    Target Date  06/26/18        PT Long Term Goals - 06/30/19 1330      PT LONG TERM GOAL #1   Title  Patient will complete all bed mobility with min A to improve functional independence for getting in and out of bed and adjusting in bed.     Baseline  max A - total A reported by family (06/19/2018); 07/08/18: still requires maxA, dtr reports improvement in rolling since starting therapy; 6/23: min A to supervision in clinic; not doing at home right; 8/5: Pt's daughter states that his rolling has improved, but still has difficulty with all other bed  mobility; 10/23/18: pts daughter states that she is doing about 35% of the work if he is in proper position, he does uses the bed rails to help roll;  using Harrel Lemon for coming to sit EOB; 10/12: pts daughter states that she is doing about 30% of the work if he is in proper position, he does uses the bed rails to help roll, but it is improving;  using Hoyer for coming to sit EOB, 10/29: min A for sit to sidelying, rolling supervision, able  to transition sidelying to sit with HHA;    Time  12    Period  Weeks    Status  Achieved      PT LONG TERM GOAL #2   Title  Patient will complete sit <> stand transfer chair to chair with Min A and LRAD to improve functional independence for household and community mobility.     Baseline  supervision for STS, still requires elevated surface height to 27"    Time  12    Period  Weeks    Status  Achieved      PT LONG TERM GOAL #3   Title  Patient will navigate power w/c with min A x 100 feet to improve mobility for household and short community distances.    Baseline  required total A (06/12/2018); able to roll 20 feet with min A (06/20/2018); 07/08/18: Pt can go approximately 10' without assist per family;  10/23/2018: pt's daughter states that his power WC mobility has become 100% better with the new hand control, slowed speed, and with practice; 10/12: pt's daughter reports that he can perform household WC mobility and limited community ambulation with minA    Time  12    Period  Weeks    Status  Achieved      PT LONG TERM GOAL #4   Title  Patient will complete car transfers W/C to family SUV with min A using LRAD to improve his abilty to participate in community activities.     Baseline  unable to complete car transfers at all (06/12/2018); unable to complete car transfers at all (06/19/2018).; 07/08/18: Unable to perform at this time; 09/11/18: unable to perform at this time; 10/23/2018: unable to complete car transfers at all; 10/12:  unable to complete car transfers at  all, 10/29: unable; 01/13/19: unable; 2/11: min-mod A +2, 3/10: min A+1    Time  12    Period  Weeks    Status  Achieved      PT LONG TERM GOAL #5   Title  Patient will ambulate at least 10 feet with min A using LRAD to improve mobility for household and community distances.    Baseline  unable to take steps (06/12/2018); able to shift R leg forward and back with max A and RW at edge of plinth (06/19/2018); 07/08/18: unable to ambulate at this time. 09/11/18: unable to perform at this time; 10/23/2018: unable to perform at this time; 10/12: unable to perform at this time, 10/29: unable at this time; 01/13/19: unable at this time, 2/11: min A to step with RLE, unable to step with LLE, 3/10: able to take 2 steps in parallel bars with min A for safety; 4/1: min A for walking 10 feet in parallel bars, 5/24: min A walking 10 feet in parallel bars;    Time  12    Period  Weeks    Status  Partially Met    Target Date  08/20/19      PT LONG TERM GOAL #6   Title  Patient will be able to safety navigate up/down ramp with power chair, exhibiting good safety awareness, mod I to safely enter/exit his home.    Baseline  2/11: supervision with intermittent min A;4/1: supervision; 5/24: supervision    Time  12    Period  Weeks    Status  Partially Met    Target Date  08/20/19      PT LONG TERM GOAL #7   Title  Patient will increase functional reach test to >15 inches in sitting to exhibit improved sitting balance and positioning and reduce fall risk;    Baseline  07/30/18: 12 inches; 09/11/18: 10-12 inches; 10/23/18: 12-13 inch; 10/12: 14.5", 10/29: 15.5 inch    Time  4    Period  Weeks    Status  Achieved      PT LONG TERM GOAL #8   Title  Patient will increase LLE quad strength to 3+/5 to improve functional strength for standing and mobility;    Baseline  10/28: 2+/5; 01/13/19: 2+/5, 2/11: 3-/5, 04/16/19: 3-/5, 05/08/19: 3-/5, 5/24: 3-/5    Time  12    Period  Weeks    Status  Partially Met    Target Date   08/20/19      PT LONG TERM GOAL  #9   TITLE  Patient will report no vertigo with provoking motions or positions in order to be able to perform ADLs and mobility tasks without symptoms.    Baseline  06/30/19: reports no dizziness in last week;    Time  12    Period  Weeks    Status  New    Target Date  08/20/19            Plan - 07/03/19 1523    Clinical Impression Statement  Patient motivated and tolerated session well. He was instructed in transfers to/from mat table, using RW for better mobility; He continues to require cues for forward weight and better trunk control. Patient exhibits less lateral trunk lean this session and better neutral weight shift. Instructed patient in advanced LE strengthening in hooklying position. Pt is able to exhibit better muscle activation with improved ROM on LLE. He does fatigue quickly, requiring short rest breaks. Patient completed stand pivot transfer with RW well; Patient seems to respond better with functional tasks compared to isolated LLE movement. He would benefit from additional skilled PT intervention to improve strength, balance and mobility;    Personal Factors and Comorbidities  Age;Comorbidity 3+    Comorbidities  Relevant past medical history and comorbidities include long term steroid use for lupus, CKD, chronic osteomyelitis, cervical spine stenosis, BPH, APS, alcohol abuse, peripheral artery disease, depression, GERD, obstructive sleep apnea, HTN, IBS, IgA deficiency, lumbar radiculopathy, RA, Stroke, toxic maculopathy in both eyes, systemic lupus erythematosus related syndrome, cardiac catheterization, carotid endarterectomy, cervical laminectomy, coronary angioplasty, Fusion C5-C7, knee arthroplasty, lumbar surgery.    Examination-Activity Limitations  Bed Mobility;Bend;Sit;Toileting;Stand;Stairs;Lift;Transfers;Squat;Locomotion Level;Carry;Dressing;Hygiene/Grooming;Continence    Examination-Participation Restrictions  Yard Work;Interpersonal  Relationship;Community Activity    Stability/Clinical Decision Making  Evolving/Moderate complexity    Rehab Potential  Fair    PT Frequency  3x / week    PT Duration  12 weeks    PT Treatment/Interventions  ADLs/Self Care Home Management;Electrical Stimulation;Moist Heat;Cryotherapy;Gait training;Stair training;Functional mobility training;Therapeutic exercise;Balance training;Neuromuscular re-education;Cognitive remediation;Patient/family education;Orthotic Fit/Training;Wheelchair mobility training;Manual techniques;Passive range of motion;Energy conservation;Joint Manipulations;Canalith Repostioning;Therapeutic activities;Vestibular    PT Next Visit Plan  Work on strength, sitting/standing balance, functional activities such as rolling, bed mobility, transfers; caregiver training    PT Home Exercise Plan  no updates this session    Consulted and Agree with Plan of Care  Patient;Family member/caregiver    Family Member Consulted  Wife       Patient will benefit from skilled therapeutic intervention in order to improve the following deficits and impairments:  Abnormal gait, Decreased activity tolerance, Decreased cognition, Decreased endurance, Decreased knowledge of use of DME, Decreased range of motion,  Decreased skin integrity, Decreased strength, Impaired perceived functional ability, Impaired sensation, Impaired UE functional use, Improper body mechanics, Pain, Cardiopulmonary status limiting activity, Decreased balance, Decreased coordination, Decreased mobility, Difficulty walking, Impaired tone, Postural dysfunction, Dizziness  Visit Diagnosis: Muscle weakness (generalized)  Other lack of coordination  Difficulty in walking, not elsewhere classified  Repeated falls     Problem List Patient Active Problem List   Diagnosis Date Noted  . Sepsis ([place]) 10/18/2018    Kearstyn Avitia PT, DPT 07/03/2019, 3:25 PM  [place] MAIN [facility]  SERVICES [address] [place], [place], [address] Phone: [phone]   Fax:  [phone]  Name: Kenneth Summers MRN: [id number] Date of Birth: July 12, 1945

## 2019-07-03 NOTE — Therapy (Signed)
[place] MAIN [facility] SERVICES [address] [place], [place], [address] Phone: [phone]   Fax:  [phone]  Occupational Therapy Treatment  Patient Details  Name: Kenneth Summers MRN: [id number] Date of Birth: 07/16/1945 No data recorded  Encounter Date: 07/03/2019  OT End of Session - 07/03/19 1745    Visit Number  [id number]    Number of Visits  157    Date for OT Re-Evaluation  09/15/19    Authorization Type  Progress report period starting 12/26/2018    OT Start Time  1300    OT Stop Time  1345    OT Time Calculation (min)  45 min    Activity Tolerance  Patient tolerated treatment well;Patient limited by fatigue    Behavior During Therapy  [facility] for tasks assessed/performed       Past Medical History:  Diagnosis Date  . Actinic keratosis   . Alcohol abuse   . APS (antiphospholipid syndrome) ([place])   . Basal cell carcinoma   . BPH (benign prostatic hyperplasia)   . CAD (coronary artery disease)   . Cellulitis   . Cervical spinal stenosis   . Chronic osteomyelitis ([place])   . CKD (chronic kidney disease)   . CKD (chronic kidney disease)   . Clostridium difficile diarrhea   . Depression   . Diplopia   . Fatigue   . GERD (gastroesophageal reflux disease)   . Hyperlipemia   . Hypertension   . Hypovitaminosis D   . IBS (irritable bowel syndrome)   . 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 Insomnia   . Left lumbosacral radiculopathy   . Moderate obstructive sleep apnea   . Osteomyelitis of foot ([place])   . PAD (peripheral artery disease) ([place])   . Pruritus   . RA (rheumatoid arthritis) ([place])   . Radiculopathy   . Restless legs syndrome   . RLS (restless legs syndrome)   . SLE (systemic lupus erythematosus related syndrome) ([place])   . 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 Stroke ([place])   . Toxic maculopathy of both eyes     Past Surgical History:  Procedure Laterality Date  . CARDIAC CATHETERIZATION    .  CAROTID ENDARTERECTOMY    . CERVICAL LAMINECTOMY    . CORONARY ANGIOPLASTY    . FRACTURE SURGERY    . fusion C5-6-7    . HERNIA REPAIR    . KNEE ARTHROSCOPY    . TONSILLECTOMY      There were no vitals filed for this visit.  Subjective Assessment - 07/03/19 1745    Subjective   Pt. reports no pain today.    Patient is accompanied by:  Family member    Pertinent History  Pt. is a 74 y.o. 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 Pt. PMHx includes: Multiple Falls, Lupus, DJD, Renal Abscess, CVA. Pt. resides with his wife. Pt.'s wife and daughter assist with ADLs. Pt. has caregivers in  for 2 hours a day, 6 days a week. Pt. received Rehab services in acute care, at SNF for STR, and [employer] services. Pt. is retired from [employer] for [employer] and [employer].    Currently in Pain?  No/denies     OT TREATMENT     Neuro muscular re-education:   Pt. worked on using his LUE for reaching, grasping, and flipping [place] style discs. Pt. worked or reaching to the  far left, and far right with trunk rotation when flipping 60 discs lined up into 4 rows of 15 discs. Pt. worked on using his left hand to stack the discs in groups of 3, and place them into the case.  Increased, and consistent cues were required to use his left hand throughout the task.    Pt.'s daughter reports that he had a dizzy spell just prior to therapy today. Pt. started a new seizure medication. BP 124/67. Pt.'s back and his motor w/c back were very hot upon arrival. Pt. Was provided with a cold pack placed between his back at the chair. Pt. has clonus in the left hand when attempting to reach. Pt. reports feeling weaker today. Pt. required increased rest breaks, and increased time to complete the task today, as well as forward flexing with the trunk. Pt. continues to require increased cues to engage his LUE in, and consistently use his left UE throughout the task today. Pt. required more cuing  to engage his trunk when flexing forward with reaching. Pt. continues to work on improving LUE functioning, left sided awareness, trunk control, and processing skills needed for ADL, and IADL functioning.                           OT Education - 07/03/19 1745    Education Details  Left UE functioning, left sided awareness.    Person(s) Educated  Patient    Methods  Explanation;Demonstration    Comprehension  Returned demonstration;Tactile cues required;Verbalized understanding;Need further instruction          OT Long Term Goals - 06/23/19 1414      OT LONG TERM GOAL #1   Title  Pt. will increase left shoulder flexion AROM by 10 degrees to access cabinet/shelf.    Baseline  Shoulder flexion:130. Pt. is progressing, and continues to work on reaching with increasing emphasis on flexing trunk to improve functional reach.    Time  12    Period  Weeks    Status  Partially Met    Target Date  09/15/19      OT LONG TERM GOAL #2   Title  Pt. will donn a shirt with Supervision.    Baseline  Pt. continues to require MinA donning a zip down jacket with consistent cues to initiate the correct direction of the jacket.    Time  12    Status  On-going    Target Date  09/15/19      OT LONG TERM GOAL #4   Title  Pt. will improve left grip strength by 10# to be able to open a jar/container.    Baseline  Pt. continues to work towards opening containers, and bottles.    Time  12    Period  Weeks    Status  On-going    Target Date  09/15/19      OT LONG TERM GOAL #5   Title  Pt. will improve left hand [facility] skills to be able to assist with buttoning/zipping.    Baseline  Pt. is improving with manipulating the zipper on his jacket, however continues to require practice. Pt. continues to work on improving bilateral [facility] skills for buttoning/zipping    Time  12    Period  Weeks    Status  On-going    Target Date  09/15/19      OT LONG TERM GOAL #6   Title  Pt. will  demonstrate  visual conmpensatory strategies for 100% of the time during ADLs    Baseline  Pt. requires  fewer cues for left sides awareness.    Time  12    Period  Weeks    Status  Partially Met    Target Date  09/15/19      OT LONG TERM GOAL #8   Title  Pt. will accurately identify potential safety hazard using good safety awareness, and judgement 100% for ADLs, and IADLs.    Baseline  Pt. requires cues    Time  12    Period  Weeks    Status  New    Target Date  09/15/19      OT LONG TERM GOAL  #9   TITLE  Pt. will  navigate his w/c around obstacles with Supervision and 100% accuracy    Baseline  Pt. continues to require cuing for obstacle on the left, however requires less cues.    Time  12    Period  Weeks    Status  On-going    Target Date  09/15/19      OT LONG TERM GOAL  #11   TITLE  Pt. will increase BUE strength by 31m grades in preparation for ADL transfers.    Baseline  Pt. is improving with LUE strength    Time  12    Period  Weeks    Status  On-going    Target Date  09/15/19      OT LONG TERM GOAL  #12   TITLE  Pt. will improve LUE functional reaching with the left engaging the trunk 100% of the time during ADL tasks with minimal cues.    Baseline  06/23/2019: Pt. is improving with LUE functional reachin, howwver requires cues to engage trunk for extended reaching.    Time  12    Period  Weeks    Status  On-going    Target Date  09/15/19            Plan - 07/03/19 1746    Clinical Impression Statement  Pt.'s daughter reports that he had a dizzy spell just prior to therapy today. Pt. started a new seizure medication. BP 124/67. Pt.'s back and his motor w/c back were very hot upon arrival. Pt. Was provided with a cold pack placed between his back at the chair. Pt. has clonus in the left hand when attempting to reach. Pt. reports feeling weaker today. Pt. required increased rest breaks, and increased time to complete the task today, as well as forward flexing  with the trunk. Pt. continues to require increased cues to engage his LUE in, and consistently use his left UE throughout the task today. Pt. required more cuing to engage his trunk when flexing forward with reaching. Pt. continues to work on improving LUE functioning, left sided awareness, trunk control, and processing skills needed for ADL, and IADL functioning.      OT Occupational Profile and History  Problem Focused Assessment - Including review of records relating to presenting problem    Occupational performance deficits (Please refer to evaluation for details):  ADL's    Body Structure / Function / Physical Skills  UE functional use;Coordination;FMC;Dexterity;Strength;ROM    Cognitive Skills  Attention;Memory;Emotional;Problem Solve;Safety Awareness    Rehab Potential  Good    Clinical Decision Making  Several treatment options, min-mod task modification necessary    Comorbidities Affecting Occupational Performance:  Presence of comorbidities impacting occupational performance    Comorbidities impacting occupational  performance description:  Phyical, cognitive, visual,  medical comorbidities    Modification or Assistance to Complete Evaluation   No modification of tasks or assist necessary to complete eval    OT Frequency  2x / week    OT Duration  12 weeks    OT Treatment/Interventions  Self-care/ADL training;DME and/or AE instruction;Therapeutic exercise;Therapeutic activities;Moist Heat;Cognitive remediation/compensation;Neuromuscular education;Visual/perceptual remediation/compensation;Coping strategies training;Patient/family education;Passive range of motion;Psychosocial skills training;Energy conservation;Functional Mobility Training    Consulted and Agree with Plan of Care  Patient    Family Member Consulted  Daughter [name]       Patient will benefit from skilled therapeutic intervention in order to improve the following deficits and impairments:   Body Structure / Function /  Physical Skills: UE functional use, Coordination, FMC, Dexterity, Strength, ROM Cognitive Skills: Attention, Memory, Emotional, Problem Solve, Safety Awareness     Visit Diagnosis: Muscle weakness (generalized)  Other lack of coordination    Problem List Patient Active Problem List   Diagnosis Date Noted  . Sepsis ([place]) 10/18/2018    Harrel Carina, MS,OTR/L 07/03/2019, 5:47 PM  [place] MAIN [facility] SERVICES [address] [place], [place], [address] Phone: [phone]   Fax:  [phone]  Name: Kenneth Summers MRN: [id number] Date of Birth: 05-07-1945

## 2019-07-03 NOTE — Therapy (Deleted)
[facility] [address] [place], [place], [address] Phone: [phone]   Fax:  [phone]  Occupational Therapy Treatment  Patient Details  Name: Kenneth Summers MRN: [id number] Date of Birth: 09/15/1945 No data recorded  Encounter Date: 07/03/2019  OT End of Session - 07/03/19 1745    Visit Number  [id number]    Number of Visits  157    Date for OT Re-Evaluation  09/15/19    Authorization Type  Progress report period starting 12/26/2018    OT Start Time  1300    OT Stop Time  1345    OT Time Calculation (min)  45 min    Activity Tolerance  Patient tolerated treatment well;Patient limited by fatigue    Behavior During Therapy  WFL for tasks assessed/performed       Past Medical History:  Diagnosis Date  . Actinic keratosis   . Alcohol abuse   . APS (antiphospholipid syndrome) (HCC)   . Basal cell carcinoma   . BPH (benign prostatic hyperplasia)   . CAD (coronary artery disease)   . Cellulitis   . Cervical spinal stenosis   . Chronic osteomyelitis (HCC)   . CKD (chronic kidney disease)   . CKD (chronic kidney disease)   . Clostridium difficile diarrhea   . Depression   . Diplopia   . Fatigue   . GERD (gastroesophageal reflux disease)   . Hyperlipemia   . Hypertension   . Hypovitaminosis D   . IBS (irritable bowel syndrome)   . IgA deficiency (HCC)   . Insomnia   . Left lumbosacral radiculopathy   . Moderate obstructive sleep apnea   . Osteomyelitis of foot (HCC)   . PAD (peripheral artery disease) (HCC)   . Pruritus   . RA (rheumatoid arthritis) (HCC)   . Radiculopathy   . Restless legs syndrome   . RLS (restless legs syndrome)   . SLE (systemic lupus erythematosus related syndrome) (HCC)   . 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 Stroke (HCC)   . Toxic maculopathy of both eyes     Past Surgical History:  Procedure Laterality Date  . CARDIAC CATHETERIZATION    .  CAROTID ENDARTERECTOMY    . CERVICAL LAMINECTOMY    . CORONARY ANGIOPLASTY    . FRACTURE SURGERY    . fusion C5-6-7    . HERNIA REPAIR    . KNEE ARTHROSCOPY    . TONSILLECTOMY      There were no vitals filed for this visit.  Subjective Assessment - 07/03/19 1745    Subjective   Pt. reports no pain today.    Patient is accompanied by:  Family member    Pertinent History  Pt. is a 74 y.o. 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 Pt. PMHx includes: Multiple Falls, Lupus, DJD, Renal Abscess, CVA. Pt. resides with his wife. Pt.'s wife and daughter assist with ADLs. Pt. has caregivers in  for 2 hours a day, 6 days a week. Pt. received Rehab services in acute care, at SNF for STR, and HHOT services. 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    Currently in Pain?  No/denies     OT TREATMENT     Neuro muscular re-education:   Pt. worked on using his LUE for reaching, grasping, and flipping [place] style discs. Pt. worked or reaching to the   far left, and far right with trunk rotation when flipping 60 discs lined up into 4 rows of 15 discs. Pt. worked on using his left hand to stack the discs in groups of 3, and place them into the case.  Increased, and consistent cues were required to use his left hand throughout the task.    Pt.'s daughter reports that he had a dizzy spell just prior to therapy today. Pt. started a new seizure medication. BP 124/67. Pt.'s back and his motor w/c back were very hot upon arrival. Pt. Was provided with a cold pack placed between his back at the chair. Pt. has clonus in the left hand when attempting to reach. Pt. reports feeling weaker today. Pt. required increased rest breaks, and increased time to complete the task today, as well as forward flexing with the trunk. Pt. continues to require increased cues to engage his LUE in, and consistently use his left UE throughout the task today. Pt. required more cuing  to engage his trunk when flexing forward with reaching. Pt. continues to work on improving LUE functioning, left sided awareness, trunk control, and processing skills needed for ADL, and IADL functioning.                           OT Education - 07/03/19 1745    Education Details  Left UE functioning, left sided awareness.    Person(s) Educated  Patient    Methods  Explanation;Demonstration    Comprehension  Returned demonstration;Tactile cues required;Verbalized understanding;Need further instruction          OT Long Term Goals - 06/23/19 1414      OT LONG TERM GOAL #1   Title  Pt. will increase left shoulder flexion AROM by 10 degrees to access cabinet/shelf.    Baseline  Shoulder flexion:130. Pt. is progressing, and continues to work on reaching with increasing emphasis on flexing trunk to improve functional reach.    Time  12    Period  Weeks    Status  Partially Met    Target Date  09/15/19      OT LONG TERM GOAL #2   Title  Pt. will donn a shirt with Supervision.    Baseline  Pt. continues to require MinA donning a zip down jacket with consistent cues to initiate the correct direction of the jacket.    Time  12    Status  On-going    Target Date  09/15/19      OT LONG TERM GOAL #4   Title  Pt. will improve left grip strength by 10# to be able to open a jar/container.    Baseline  Pt. continues to work towards opening containers, and bottles.    Time  12    Period  Weeks    Status  On-going    Target Date  09/15/19      OT LONG TERM GOAL #5   Title  Pt. will improve left hand [facility] skills to be able to assist with buttoning/zipping.    Baseline  Pt. is improving with manipulating the zipper on his jacket, however continues to require practice. Pt. continues to work on improving bilateral [facility] skills for buttoning/zipping    Time  12    Period  Weeks    Status  On-going    Target Date  09/15/19      OT LONG TERM GOAL #6   Title  Pt. will  demonstrate  visual conmpensatory strategies for 100% of the time during ADLs    Baseline  Pt. requires  fewer cues for left sides awareness.    Time  12    Period  Weeks    Status  Partially Met    Target Date  09/15/19      OT LONG TERM GOAL #8   Title  Pt. will accurately identify potential safety hazard using good safety awareness, and judgement 100% for ADLs, and IADLs.    Baseline  Pt. requires cues    Time  12    Period  Weeks    Status  New    Target Date  09/15/19      OT LONG TERM GOAL  #9   TITLE  Pt. will  navigate his w/c around obstacles with Supervision and 100% accuracy    Baseline  Pt. continues to require cuing for obstacle on the left, however requires less cues.    Time  12    Period  Weeks    Status  On-going    Target Date  09/15/19      OT LONG TERM GOAL  #11   TITLE  Pt. will increase BUE strength by 2mm grades in preparation for ADL transfers.    Baseline  Pt. is improving with LUE strength    Time  12    Period  Weeks    Status  On-going    Target Date  09/15/19      OT LONG TERM GOAL  #12   TITLE  Pt. will improve LUE functional reaching with the left engaging the trunk 100% of the time during ADL tasks with minimal cues.    Baseline  06/23/2019: Pt. is improving with LUE functional reachin, howwver requires cues to engage trunk for extended reaching.    Time  12    Period  Weeks    Status  On-going    Target Date  09/15/19            Plan - 07/03/19 1746    Clinical Impression Statement  Pt.'s daughter reports that he had a dizzy spell just prior to therapy today. Pt. started a new seizure medication. BP 124/67. Pt.'s back and his motor w/c back were very hot upon arrival. Pt. Was provided with a cold pack placed between his back at the chair. Pt. has clonus in the left hand when attempting to reach. Pt. reports feeling weaker today. Pt. required increased rest breaks, and increased time to complete the task today, as well as forward flexing  with the trunk. Pt. continues to require increased cues to engage his LUE in, and consistently use his left UE throughout the task today. Pt. required more cuing to engage his trunk when flexing forward with reaching. Pt. continues to work on improving LUE functioning, left sided awareness, trunk control, and processing skills needed for ADL, and IADL functioning.      OT Occupational Profile and History  Problem Focused Assessment - Including review of records relating to presenting problem    Occupational performance deficits (Please refer to evaluation for details):  ADL's    Body Structure / Function / Physical Skills  UE functional use;Coordination;FMC;Dexterity;Strength;ROM    Cognitive Skills  Attention;Memory;Emotional;Problem Solve;Safety Awareness    Rehab Potential  Good    Clinical Decision Making  Several treatment options, min-mod task modification necessary    Comorbidities Affecting Occupational Performance:  Presence of comorbidities impacting occupational performance    Comorbidities impacting occupational   performance description:  Phyical, cognitive, visual,  medical comorbidities    Modification or Assistance to Complete Evaluation   No modification of tasks or assist necessary to complete eval    OT Frequency  2x / week    OT Duration  12 weeks    OT Treatment/Interventions  Self-care/ADL training;DME and/or AE instruction;Therapeutic exercise;Therapeutic activities;Moist Heat;Cognitive remediation/compensation;Neuromuscular education;Visual/perceptual remediation/compensation;Coping strategies training;Patient/family education;Passive range of motion;Psychosocial skills training;Energy conservation;Functional Mobility Training    Consulted and Agree with Plan of Care  Patient    Family Member Consulted  Daughter Janet       Patient will benefit from skilled therapeutic intervention in order to improve the following deficits and impairments:   Body Structure / Function /  Physical Skills: UE functional use, Coordination, FMC, Dexterity, Strength, ROM Cognitive Skills: Attention, Memory, Emotional, Problem Solve, Safety Awareness     Visit Diagnosis: Muscle weakness (generalized)  Other lack of coordination    Problem List Patient Active Problem List   Diagnosis Date Noted  . Sepsis (HCC) 10/18/2018    Mykal Batiz, MS,OTR/L 07/03/2019, 5:47 PM  [facility] [address] [place], [place], [address] Phone: [phone]   Fax:  [phone]  Name: Kenneth Summers MRN: [id number] Date of Birth: 09/15/1945  

## 2019-07-09 ENCOUNTER — Encounter: Payer: Self-pay | Admitting: Occupational Therapy

## 2019-07-09 ENCOUNTER — Ambulatory Visit: Payer: Medicare Other | Attending: Family Medicine | Admitting: Physical Therapy

## 2019-07-09 ENCOUNTER — Other Ambulatory Visit: Payer: Self-pay

## 2019-07-09 ENCOUNTER — Ambulatory Visit: Payer: Medicare Other | Admitting: Occupational Therapy

## 2019-07-09 ENCOUNTER — Encounter: Payer: Self-pay | Admitting: Physical Therapy

## 2019-07-09 DIAGNOSIS — R278 Other lack of coordination: Secondary | ICD-10-CM

## 2019-07-09 DIAGNOSIS — I69354 Hemiplegia and hemiparesis following cerebral infarction affecting left non-dominant side: Secondary | ICD-10-CM | POA: Diagnosis present

## 2019-07-09 DIAGNOSIS — R296 Repeated falls: Secondary | ICD-10-CM | POA: Diagnosis present

## 2019-07-09 DIAGNOSIS — R262 Difficulty in walking, not elsewhere classified: Secondary | ICD-10-CM

## 2019-07-09 DIAGNOSIS — M6281 Muscle weakness (generalized): Secondary | ICD-10-CM | POA: Diagnosis not present

## 2019-07-09 DIAGNOSIS — H547 Unspecified visual loss: Secondary | ICD-10-CM | POA: Insufficient documentation

## 2019-07-09 NOTE — Therapy (Signed)
[facility] [address] [place], [place], [address] Phone: [phone]   Fax:  [phone]  Physical Therapy Treatment  Patient Details  Name: Kenneth Summers MRN: [id number] Date of Birth: 03/23/1945 No data recorded  Encounter Date: 07/09/2019  PT End of Session - 07/09/19 1308    Visit Number  [id number]    Number of Visits  180    Date for PT Re-Evaluation  08/20/19    Authorization Type  Medicare reporting period starting 09/11/18    Authorization Time Period  goals last assessed 05/26/19    PT Start Time  1302    PT Stop Time  1345    PT Time Calculation (min)  43 min    Equipment Utilized During Treatment  Gait belt    Activity Tolerance  Patient tolerated treatment well;No increased pain    Behavior During Therapy  WFL for tasks assessed/performed       Past Medical History:  Diagnosis Date  . Actinic keratosis   . Alcohol abuse   . APS (antiphospholipid syndrome) (HCC)   . Basal cell carcinoma   . BPH (benign prostatic hyperplasia)   . CAD (coronary artery disease)   . Cellulitis   . Cervical spinal stenosis   . Chronic osteomyelitis (HCC)   . CKD (chronic kidney disease)   . CKD (chronic kidney disease)   . Clostridium difficile diarrhea   . Depression   . Diplopia   . Fatigue   . GERD (gastroesophageal reflux disease)   . Hyperlipemia   . Hypertension   . Hypovitaminosis D   . IBS (irritable bowel syndrome)   . IgA deficiency (HCC)   . Insomnia   . Left lumbosacral radiculopathy   . Moderate obstructive sleep apnea   . Osteomyelitis of foot (HCC)   . PAD (peripheral artery disease) (HCC)   . Pruritus   . RA (rheumatoid arthritis) (HCC)   . Radiculopathy   . Restless legs syndrome   . RLS (restless legs syndrome)   . SLE (systemic lupus erythematosus related syndrome) (HCC)   . 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 Stroke (HCC)   . Toxic maculopathy of both  eyes     Past Surgical History:  Procedure Laterality Date  . CARDIAC CATHETERIZATION    . CAROTID ENDARTERECTOMY    . CERVICAL LAMINECTOMY    . CORONARY ANGIOPLASTY    . FRACTURE SURGERY    . fusion C5-6-7    . HERNIA REPAIR    . KNEE ARTHROSCOPY    . TONSILLECTOMY      There were no vitals filed for this visit.           TREATMENT:  Prior to transfersand in between transfers: Patient sittingin wheelchair: -hip flexion marchx15reps bilaterally -LAQ x15 reps each LE Seated with arms across chest to challenge seated balance:  Deep breathing erect posture x5 reps;  Trunk/head rotation x5 reps each direction;  Ptrequires verbal and visual cues to improve ROM;he had a harder time with trunk control with heavy right side lean;   Following exercise: Transferred sit<>Stand in parallel bars (seat adjusted to 27 inch from floor height) x7 reps throughout session;He was able to transfer min A to CGAmodVCs for forward weight shift for better trunk control and transfer ability;  Standing in parallel bars: Standing,min A with min VCs for weight shift and increased extension for better stance control; -Mini squat   x10 reps with min A and min VCs for weight shift and to increase LE muscle activation;  -weight shift side/side x10 reps withCGA and min VCs for erect posture and increased LLE knee extension in stance; -weight shift to neutral, with cues for hip extensionand to shift to neutral without heavy right lateral lean Progressed to reducing rail assist from 2-1 intermittent rail assist x5-10sec x1setsto facilitate increased weight bearing in BLE and to improve LE weight acceptance.   Patient initially exhibits flexed posture with decreased hip extension; he also exhibits heavy lean to right side requiring instruction for weight shift to neutral position;    Patient requiredmodA to scoot back in chair at end of session due to increased  fatigue  Response to treatment: Toleratedfair. Patient heavily fatigued at end of session. Session ended early due to concern of possible seizure and new onset fatigue and concern of worsening symptoms. Educated patient/caregiver on importance of resting to reduce risk for progressive weakness;                      PT Education - 07/09/19 1307    Education Details  LE exercise, strengthening, posture, HEP    Person(s) Educated  Patient    Methods  Explanation;Verbal cues    Comprehension  Verbalized understanding;Returned demonstration;Verbal cues required;Need further instruction       PT Short Term Goals - 05/28/19 0717      PT SHORT TERM GOAL #1   Title  Be independent with initial home exercise program for self-management of symptoms.    Baseline  HEP to be given at second session; advice to practice unsupported sitting at home (06/19/2018); 07/08/18: Performing at home, 6/29: needs assistance but is doing exercise program regularly; 08/22/18: adherent;    Time  2    Period  Weeks    Status  Achieved    Target Date  06/26/18        PT Long Term Goals - 06/30/19 1330      PT LONG TERM GOAL #1   Title  Patient will complete all bed mobility with min A to improve functional independence for getting in and out of bed and adjusting in bed.     Baseline  max A - total A reported by family (06/19/2018); 07/08/18: still requires maxA, dtr reports improvement in rolling since starting therapy; 6/23: min A to supervision in clinic; not doing at home right; 8/5: Pt's daughter states that his rolling has improved, but still has difficulty with all other bed mobility; 10/23/18: pts daughter states that she is doing about 35% of the work if he is in proper position, he does uses the bed rails to help roll;  using [place] for coming to sit EOB; 10/12: pts daughter states that she is doing about 30% of the work if he is in proper position, he does uses the bed rails to help roll, but  it is improving;  using Hoyer for coming to sit EOB, 10/29: min A for sit to sidelying, rolling supervision, able to transition sidelying to sit with HHA;    Time  12    Period  Weeks    Status  Achieved      PT LONG TERM GOAL #2   Title  Patient will complete sit <> stand transfer chair to chair with Min A and LRAD to improve functional independence for household and community mobility.     Baseline  supervision for STS, still requires elevated surface   height to 27"    Time  12    Period  Weeks    Status  Achieved      PT LONG TERM GOAL #3   Title  Patient will navigate power w/c with min A x 100 feet to improve mobility for household and short community distances.    Baseline  required total A (06/12/2018); able to roll 20 feet with min A (06/20/2018); 07/08/18: Pt can go approximately 10' without assist per family;  10/23/2018: pt's daughter states that his power WC mobility has become 100% better with the new hand control, slowed speed, and with practice; 10/12: pt's daughter reports that he can perform household WC mobility and limited community ambulation with minA    Time  12    Period  Weeks    Status  Achieved      PT LONG TERM GOAL #4   Title  Patient will complete car transfers W/C to family SUV with min A using LRAD to improve his abilty to participate in community activities.     Baseline  unable to complete car transfers at all (06/12/2018); unable to complete car transfers at all (06/19/2018).; 07/08/18: Unable to perform at this time; 09/11/18: unable to perform at this time; 10/23/2018: unable to complete car transfers at all; 10/12:  unable to complete car transfers at all, 10/29: unable; 01/13/19: unable; 2/11: min-mod A +2, 3/10: min A+1    Time  12    Period  Weeks    Status  Achieved      PT LONG TERM GOAL #5   Title  Patient will ambulate at least 10 feet with min A using LRAD to improve mobility for household and community distances.    Baseline  unable to take steps (06/12/2018);  able to shift R leg forward and back with max A and RW at edge of plinth (06/19/2018); 07/08/18: unable to ambulate at this time. 09/11/18: unable to perform at this time; 10/23/2018: unable to perform at this time; 10/12: unable to perform at this time, 10/29: unable at this time; 01/13/19: unable at this time, 2/11: min A to step with RLE, unable to step with LLE, 3/10: able to take 2 steps in parallel bars with min A for safety; 4/1: min A for walking 10 feet in parallel bars, 5/24: min A walking 10 feet in parallel bars;    Time  12    Period  Weeks    Status  Partially Met    Target Date  08/20/19      PT LONG TERM GOAL #6   Title  Patient will be able to safety navigate up/down ramp with power chair, exhibiting good safety awareness, mod I to safely enter/exit his home.    Baseline  2/11: supervision with intermittent min A;4/1: supervision; 5/24: supervision    Time  12    Period  Weeks    Status  Partially Met    Target Date  08/20/19      PT LONG TERM GOAL #7   Title  Patient will increase functional reach test to >15 inches in sitting to exhibit improved sitting balance and positioning and reduce fall risk;    Baseline  07/30/18: 12 inches; 09/11/18: 10-12 inches; 10/23/18: 12-13 inch; 10/12: 14.5", 10/29: 15.5 inch    Time  4    Period  Weeks    Status  Achieved      PT LONG TERM GOAL #8   Title  Patient will increase   LLE quad strength to 3+/5 to improve functional strength for standing and mobility;    Baseline  10/28: 2+/5; 01/13/19: 2+/5, 2/11: 3-/5, 04/16/19: 3-/5, 05/08/19: 3-/5, 5/24: 3-/5    Time  12    Period  Weeks    Status  Partially Met    Target Date  08/20/19      PT LONG TERM GOAL  #9   TITLE  Patient will report no vertigo with provoking motions or positions in order to be able to perform ADLs and mobility tasks without symptoms.    Baseline  06/30/19: reports no dizziness in last week;    Time  12    Period  Weeks    Status  New    Target Date  08/20/19             Plan - 07/09/19 1345    Clinical Impression Statement  Patient motivated and participated well within session. Instructed patient in sit<>Stand transfers in parallel bars. He is progressing well with improved trunk control and less right lateral lean. He does continues to have posterior lean requiring cues for erect standing posture. instructed patient in advanced LE strengthening exercise. Patient does require min VCs for proper exercise technique. He would benefit from additional skilled PT intervention to improve strength, balance and mobility;    Personal Factors and Comorbidities  Age;Comorbidity 3+    Comorbidities  Relevant past medical history and comorbidities include long term steroid use for lupus, CKD, chronic osteomyelitis, cervical spine stenosis, BPH, APS, alcohol abuse, peripheral artery disease, depression, GERD, obstructive sleep apnea, HTN, IBS, IgA deficiency, lumbar radiculopathy, RA, Stroke, toxic maculopathy in both eyes, systemic lupus erythematosus related syndrome, cardiac catheterization, carotid endarterectomy, cervical laminectomy, coronary angioplasty, Fusion C5-C7, knee arthroplasty, lumbar surgery.    Examination-Activity Limitations  Bed Mobility;Bend;Sit;Toileting;Stand;Stairs;Lift;Transfers;Squat;Locomotion Level;Carry;Dressing;Hygiene/Grooming;Continence    Examination-Participation Restrictions  Yard Work;Interpersonal Relationship;Community Activity    Stability/Clinical Decision Making  Evolving/Moderate complexity    Rehab Potential  Fair    PT Frequency  3x / week    PT Duration  12 weeks    PT Treatment/Interventions  ADLs/Self Care Home Management;Electrical Stimulation;Moist Heat;Cryotherapy;Gait training;Stair training;Functional mobility training;Therapeutic exercise;Balance training;Neuromuscular re-education;Cognitive remediation;Patient/family education;Orthotic Fit/Training;Wheelchair mobility training;Manual techniques;Passive range of  motion;Energy conservation;Joint Manipulations;Canalith Repostioning;Therapeutic activities;Vestibular    PT Next Visit Plan  Work on strength, sitting/standing balance, functional activities such as rolling, bed mobility, transfers; caregiver training    PT Home Exercise Plan  no updates this session    Consulted and Agree with Plan of Care  Patient;Family member/caregiver    Family Member Consulted  Wife       Patient will benefit from skilled therapeutic intervention in order to improve the following deficits and impairments:  Abnormal gait, Decreased activity tolerance, Decreased cognition, Decreased endurance, Decreased knowledge of use of DME, Decreased range of motion, Decreased skin integrity, Decreased strength, Impaired perceived functional ability, Impaired sensation, Impaired UE functional use, Improper body mechanics, Pain, Cardiopulmonary status limiting activity, Decreased balance, Decreased coordination, Decreased mobility, Difficulty walking, Impaired tone, Postural dysfunction, Dizziness  Visit Diagnosis: Muscle weakness (generalized)  Other lack of coordination  Difficulty in walking, not elsewhere classified     Problem List Patient Active Problem List   Diagnosis Date Noted  . Sepsis ([place]) 10/18/2018    , PT, DPT 07/09/2019, 1:48 PM  [place] [facility] [address] [place], [place], [address] Phone: [phone]   Fax:  [phone]  Name: Kenneth Summers MRN: [id number]  Date of Birth: 12/15/1945   

## 2019-07-09 NOTE — Therapy (Signed)
[place] MAIN [facility] SERVICES [address] [place], [place], [address] Phone: [phone]   Fax:  [phone]  Occupational Therapy Treatment  Patient Details  Name: Kenneth Summers MRN: [id number] Date of Birth: 1945/08/27 No data recorded  Encounter Date: 07/09/2019  OT End of Session - 07/09/19 1353    Visit Number  [id number]    Number of Visits  157    Date for OT Re-Evaluation  09/15/19    Authorization Type  Progress report period starting 12/26/2018    OT Start Time  1345    OT Stop Time  1430    OT Time Calculation (min)  45 min    Activity Tolerance  Patient tolerated treatment well;Patient limited by fatigue    Behavior During Therapy  [facility] for tasks assessed/performed       Past Medical History:  Diagnosis Date  . Actinic keratosis   . Alcohol abuse   . APS (antiphospholipid syndrome) ([place])   . Basal cell carcinoma   . BPH (benign prostatic hyperplasia)   . CAD (coronary artery disease)   . Cellulitis   . Cervical spinal stenosis   . Chronic osteomyelitis ([place])   . CKD (chronic kidney disease)   . CKD (chronic kidney disease)   . Clostridium difficile diarrhea   . Depression   . Diplopia   . Fatigue   . GERD (gastroesophageal reflux disease)   . Hyperlipemia   . Hypertension   . Hypovitaminosis D   . IBS (irritable bowel syndrome)   . 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 Insomnia   . Left lumbosacral radiculopathy   . Moderate obstructive sleep apnea   . Osteomyelitis of foot ([place])   . PAD (peripheral artery disease) ([place])   . Pruritus   . RA (rheumatoid arthritis) ([place])   . Radiculopathy   . Restless legs syndrome   . RLS (restless legs syndrome)   . SLE (systemic lupus erythematosus related syndrome) ([place])   . 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 Stroke ([place])   . Toxic maculopathy of both eyes     Past Surgical History:  Procedure Laterality Date  . CARDIAC CATHETERIZATION    .  CAROTID ENDARTERECTOMY    . CERVICAL LAMINECTOMY    . CORONARY ANGIOPLASTY    . FRACTURE SURGERY    . fusion C5-6-7    . HERNIA REPAIR    . KNEE ARTHROSCOPY    . TONSILLECTOMY      There were no vitals filed for this visit.  Subjective Assessment - 07/09/19 1352    Subjective   Pt. reports no pain today.    Patient is accompanied by:  Family member    Pertinent History  Pt. is a 74 y.o. 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 Pt. PMHx includes: Multiple Falls, Lupus, DJD, Renal Abscess, CVA. Pt. resides with his wife. Pt.'s wife and daughter assist with ADLs. Pt. has caregivers in  for 2 hours a day, 6 days a week. Pt. received Rehab services in acute care, at SNF for STR, and [place] services. Pt. is retired from [employer] for [employer] and [employer].    Patient Stated Goals  To regain use of his left UE, and do more for himself.    Currently in Pain?  No/denies      OT TREATMENT   Neuro muscular re-education:  Pt. worked on  using his LUE for reaching, and grasping 1", 3/4", and 1/2" washers, and reaching to the left to place them over vertical, and diagonal dowels. Pt. worked on reaching to the far left. Pt. Worked on stacking, and sorting washers with his left hand. Pt. required Increased, and consistent cues were required to use his left hand throughout the task. Pt. required a reclining rest break half way through the session.  Pt. reports that he has not had any side affects from his new seizure medication. Pt. requires cues for forward flexing with the trunk when reaching forward. Pt. Presented with increased leaning to the right in his w/c. Pt. was able to right self to midline with cues.  Less cues were required to engage his LUE in, and consistently use his left UEthroughout thetask today.Pt.required more cuing to engage his trunk when flexing forward with reaching.Pt.continues to work on improving LUE functioning, left  sidedawareness,trunk control,and processing skills needed for ADL, and IADL functioning.                       OT Education - 07/09/19 1353    Education Details  Left UE functioning, left sided awareness.    Person(s) Educated  Patient    Methods  Explanation;Demonstration    Comprehension  Returned demonstration;Tactile cues required;Verbalized understanding;Need further instruction          OT Long Term Goals - 06/23/19 1414      OT LONG TERM GOAL #1   Title  Pt. will increase left shoulder flexion AROM by 10 degrees to access cabinet/shelf.    Baseline  Shoulder flexion:130. Pt. is progressing, and continues to work on reaching with increasing emphasis on flexing trunk to improve functional reach.    Time  12    Period  Weeks    Status  Partially Met    Target Date  09/15/19      OT LONG TERM GOAL #2   Title  Pt. will donn a shirt with Supervision.    Baseline  Pt. continues to require MinA donning a zip down jacket with consistent cues to initiate the correct direction of the jacket.    Time  12    Status  On-going    Target Date  09/15/19      OT LONG TERM GOAL #4   Title  Pt. will improve left grip strength by 10# to be able to open a jar/container.    Baseline  Pt. continues to work towards opening containers, and bottles.    Time  12    Period  Weeks    Status  On-going    Target Date  09/15/19      OT LONG TERM GOAL #5   Title  Pt. will improve left hand [facility] skills to be able to assist with buttoning/zipping.    Baseline  Pt. is improving with manipulating the zipper on his jacket, however continues to require practice. Pt. continues to work on improving bilateral [facility] skills for buttoning/zipping    Time  12    Period  Weeks    Status  On-going    Target Date  09/15/19      OT LONG TERM GOAL #6   Title  Pt. will demonstrate visual conmpensatory strategies for 100% of the time during ADLs    Baseline  Pt. requires  fewer cues for left  sides awareness.    Time  12    Period  Weeks  Status  Partially Met    Target Date  09/15/19      OT LONG TERM GOAL #8   Title  Pt. will accurately identify potential safety hazard using good safety awareness, and judgement 100% for ADLs, and IADLs.    Baseline  Pt. requires cues    Time  12    Period  Weeks    Status  New    Target Date  09/15/19      OT LONG TERM GOAL  #9   TITLE  Pt. will  navigate his w/c around obstacles with Supervision and 100% accuracy    Baseline  Pt. continues to require cuing for obstacle on the left, however requires less cues.    Time  12    Period  Weeks    Status  On-going    Target Date  09/15/19      OT LONG TERM GOAL  #11   TITLE  Pt. will increase BUE strength by 72m grades in preparation for ADL transfers.    Baseline  Pt. is improving with LUE strength    Time  12    Period  Weeks    Status  On-going    Target Date  09/15/19      OT LONG TERM GOAL  #12   TITLE  Pt. will improve LUE functional reaching with the left engaging the trunk 100% of the time during ADL tasks with minimal cues.    Baseline  06/23/2019: Pt. is improving with LUE functional reachin, howwver requires cues to engage trunk for extended reaching.    Time  12    Period  Weeks    Status  On-going    Target Date  09/15/19            Plan - 07/09/19 1353    Clinical Impression Statement  Pt. reports that he has not had any side affects from his new seizure medication. Pt. requires cues for forward flexing with the trunk when reaching forward. Pt. Presented with increased leaning to the right in his w/c. Pt. was able to right self to midline with cues.  Less cues were required to engage his LUE in, and consistently use his left UEthroughout thetask today.Pt.required more cuing to engage his trunk when flexing forward with reaching.Pt.continues to work on improving LUE functioning, left sidedawareness,trunk control,and processing skills needed for ADL, and  IADL functioning.   OT Occupational Profile and History  Problem Focused Assessment - Including review of records relating to presenting problem    Occupational performance deficits (Please refer to evaluation for details):  ADL's    Body Structure / Function / Physical Skills  UE functional use;Coordination;FMC;Dexterity;Strength;ROM    Cognitive Skills  Attention;Memory;Emotional;Problem Solve;Safety Awareness    Rehab Potential  Good    Clinical Decision Making  Several treatment options, min-mod task modification necessary    Comorbidities Affecting Occupational Performance:  Presence of comorbidities impacting occupational performance    Comorbidities impacting occupational performance description:  Phyical, cognitive, visual,  medical comorbidities    Modification or Assistance to Complete Evaluation   No modification of tasks or assist necessary to complete eval    OT Frequency  2x / week    OT Duration  12 weeks    OT Treatment/Interventions  Self-care/ADL training;DME and/or AE instruction;Therapeutic exercise;Therapeutic activities;Moist Heat;Cognitive remediation/compensation;Neuromuscular education;Visual/perceptual remediation/compensation;Coping strategies training;Patient/family education;Passive range of motion;Psychosocial skills training;Energy conservation;Functional Mobility Training    Consulted and Agree with Plan of Care  Patient  Family Member Consulted  Wife Katharine Look       Patient will benefit from skilled therapeutic intervention in order to improve the following deficits and impairments:   Body Structure / Function / Physical Skills: UE functional use, Coordination, FMC, Dexterity, Strength, ROM Cognitive Skills: Attention, Memory, Emotional, Problem Solve, Safety Awareness     Visit Diagnosis: Muscle weakness (generalized)  Other lack of coordination    Problem List Patient Active Problem List   Diagnosis Date Noted  . Sepsis ([place]) 10/18/2018    Harrel Carina, MS, OTR/L 07/09/2019, 1:59 PM  [place] [facility] SERVICES [address] [place], [place], [address] Phone: [phone]   Fax:  [phone]  Name: Kenneth Summers MRN: [id number] Date of Birth: [birth year]

## 2019-07-10 ENCOUNTER — Encounter: Payer: Medicare Other | Admitting: Occupational Therapy

## 2019-07-10 ENCOUNTER — Ambulatory Visit: Payer: Medicare Other | Admitting: Physical Therapy

## 2019-07-14 ENCOUNTER — Encounter: Payer: Self-pay | Admitting: Occupational Therapy

## 2019-07-14 ENCOUNTER — Ambulatory Visit: Payer: Medicare Other | Admitting: Occupational Therapy

## 2019-07-14 ENCOUNTER — Encounter: Payer: Self-pay | Admitting: Physical Therapy

## 2019-07-14 ENCOUNTER — Ambulatory Visit: Payer: Medicare Other | Admitting: Physical Therapy

## 2019-07-14 ENCOUNTER — Other Ambulatory Visit: Payer: Self-pay

## 2019-07-14 DIAGNOSIS — M6281 Muscle weakness (generalized): Secondary | ICD-10-CM

## 2019-07-14 DIAGNOSIS — R296 Repeated falls: Secondary | ICD-10-CM

## 2019-07-14 DIAGNOSIS — R278 Other lack of coordination: Secondary | ICD-10-CM

## 2019-07-14 DIAGNOSIS — R262 Difficulty in walking, not elsewhere classified: Secondary | ICD-10-CM

## 2019-07-14 NOTE — Therapy (Signed)
[place] MAIN [facility] SERVICES [address] [place], [place], [address] Phone: [phone]   Fax:  [phone]  Physical Therapy Treatment  Patient Details  Name: Kenneth Summers MRN: [id number] Date of Birth: 09/30/1945 No data recorded  Encounter Date: 07/14/2019  PT End of Session - 07/14/19 1301    Visit Number  [id number]    Number of Visits  180    Date for PT Re-Evaluation  08/20/19    Authorization Type  Medicare reporting period starting 09/11/18    Authorization Time Period  goals last assessed 05/26/19    PT Start Time  1302    PT Stop Time  1345    PT Time Calculation (min)  43 min    Equipment Utilized During Treatment  Gait belt    Activity Tolerance  Patient tolerated treatment well;No increased pain    Behavior During Therapy  WFL for tasks assessed/performed       Past Medical History:  Diagnosis Date  . Actinic keratosis   . Alcohol abuse   . APS (antiphospholipid syndrome) ([place])   . Basal cell carcinoma   . BPH (benign prostatic hyperplasia)   . CAD (coronary artery disease)   . Cellulitis   . Cervical spinal stenosis   . Chronic osteomyelitis ([place])   . CKD (chronic kidney disease)   . CKD (chronic kidney disease)   . Clostridium difficile diarrhea   . Depression   . Diplopia   . Fatigue   . GERD (gastroesophageal reflux disease)   . Hyperlipemia   . Hypertension   . Hypovitaminosis D   . IBS (irritable bowel syndrome)   . 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 Insomnia   . Left lumbosacral radiculopathy   . Moderate obstructive sleep apnea   . Osteomyelitis of foot ([place])   . PAD (peripheral artery disease) ([place])   . Pruritus   . RA (rheumatoid arthritis) ([place])   . Radiculopathy   . Restless legs syndrome   . RLS (restless legs syndrome)   . SLE (systemic lupus erythematosus related syndrome) ([place])   . 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 Stroke ([place])   . Toxic maculopathy of both  eyes     Past Surgical History:  Procedure Laterality Date  . CARDIAC CATHETERIZATION    . CAROTID ENDARTERECTOMY    . CERVICAL LAMINECTOMY    . CORONARY ANGIOPLASTY    . FRACTURE SURGERY    . fusion C5-6-7    . HERNIA REPAIR    . KNEE ARTHROSCOPY    . TONSILLECTOMY      There were no vitals filed for this visit.  Subjective Assessment - 07/14/19 1304    Subjective  Reports doing well; No pain currently; Reports having some soreness in back and left hand over the weekend but states that its better today;    Patient is accompained by:  Family member    Pertinent History  Patient is a 74 y.o. male who presents to outpatient physical therapy with a referral for medical diagnosis of CVA. This patient's chief complaints consist of left hemiplegia and overall deconditioning leading to the following functional deficits: dependent for ADLs, IADLs, unable to transfer to car, dependent transfers at home with hoyer lift, unable to walk or stand without significant assistance, difficulty with W/C navigation..    Limitations  Sitting;Lifting;Standing;House hold activities;Writing;Walking;Other (comment)    Currently in Pain?  No/denies  TREATMENT:  Prior to transfersand in between transfers: Patient sittingin wheelchair: -hip flexion marchx15reps bilaterally(x3 min) Ptrequires verbal and visual cues to improve ROM;he had a harder time with trunk control with heavy right side lean;  Following exercise: Transferred sit<>Stand in parallel bars (seat adjusted to 27 inch from floor height) x8 reps throughout session;He was able to transfermin A to CGA/close supervision withmodVCs for forward weight shift for better trunk control and transfer ability;  Standing in parallel bars: Standing,min Awith min VCs for weight shift and increased extension for better stance control; -Mini squat x10 reps with min A and min VCs for weight shift and to increase LE muscle  activation;  -weight shift side/side x8reps withCGA and min VCs for erect posture and increased LLE knee extension in stance; -weight shift to neutral, with cues for hip extensionand to shift to neutral without heavy right lateral lean  Patient initially exhibits flexed posture with decreased hip extension;he also exhibits heavy lean to right side requiring instruction for weight shift to neutral position;   Progressed to gait training: Patient ambulated in parallel bars with B rail assist, min A for safety including therapist blocking LLE knee during mid stance  x2 steps with seated rest breaks  x2-3 steps with seated rest break  x9 steps (5 on right, 4 on left) Patient required max A to advance LLE forward during swing and therapist blocking LLE knee during stance for better weight shift to LLE to facilitate improved step length on RLE;   Patient requiredmodA to scoot back in chair at end of session due to increased fatigue  Response to treatment: [place].Patient heavily fatigued at end of session. He does exhibit better trunk control and weight shift this session requiring less assistance in stance with less posterior weight shift. Patient able to exhibit better hip extension in stance with improved LE weight bearing;                         PT Education - 07/14/19 1301    Education Details  LE exercise, strengthening, posture, transfers, HEP    Person(s) Educated  Patient    Methods  Explanation;Verbal cues    Comprehension  Verbalized understanding;Returned demonstration;Verbal cues required;Need further instruction       PT Short Term Goals - 05/28/19 0717      PT SHORT TERM GOAL #1   Title  Be independent with initial home exercise program for self-management of symptoms.    Baseline  HEP to be given at second session; advice to practice unsupported sitting at home (06/19/2018); 07/08/18: Performing at home, 6/29: needs assistance but  is doing exercise program regularly; 08/22/18: adherent;    Time  2    Period  Weeks    Status  Achieved    Target Date  06/26/18        PT Long Term Goals - 06/30/19 1330      PT LONG TERM GOAL #1   Title  Patient will complete all bed mobility with min A to improve functional independence for getting in and out of bed and adjusting in bed.     Baseline  max A - total A reported by family (06/19/2018); 07/08/18: still requires maxA, dtr reports improvement in rolling since starting therapy; 6/23: min A to supervision in clinic; not doing at home right; 8/5: Pt's daughter states that his rolling has improved, but still has difficulty with all other bed mobility; 10/23/18: pts daughter states that  she is doing about 35% of the work if he is in proper position, he does uses the bed rails to help roll;  using Harrel Lemon for coming to sit EOB; 10/12: pts daughter states that she is doing about 30% of the work if he is in proper position, he does uses the bed rails to help roll, but it is improving;  using Hoyer for coming to sit EOB, 10/29: min A for sit to sidelying, rolling supervision, able to transition sidelying to sit with HHA;    Time  12    Period  Weeks    Status  Achieved      PT LONG TERM GOAL #2   Title  Patient will complete sit <> stand transfer chair to chair with Min A and LRAD to improve functional independence for household and community mobility.     Baseline  supervision for STS, still requires elevated surface height to 27"    Time  12    Period  Weeks    Status  Achieved      PT LONG TERM GOAL #3   Title  Patient will navigate power w/c with min A x 100 feet to improve mobility for household and short community distances.    Baseline  required total A (06/12/2018); able to roll 20 feet with min A (06/20/2018); 07/08/18: Pt can go approximately 10' without assist per family;  10/23/2018: pt's daughter states that his power WC mobility has become 100% better with the new hand control,  slowed speed, and with practice; 10/12: pt's daughter reports that he can perform household WC mobility and limited community ambulation with minA    Time  12    Period  Weeks    Status  Achieved      PT LONG TERM GOAL #4   Title  Patient will complete car transfers W/C to family SUV with min A using LRAD to improve his abilty to participate in community activities.     Baseline  unable to complete car transfers at all (06/12/2018); unable to complete car transfers at all (06/19/2018).; 07/08/18: Unable to perform at this time; 09/11/18: unable to perform at this time; 10/23/2018: unable to complete car transfers at all; 10/12:  unable to complete car transfers at all, 10/29: unable; 01/13/19: unable; 2/11: min-mod A +2, 3/10: min A+1    Time  12    Period  Weeks    Status  Achieved      PT LONG TERM GOAL #5   Title  Patient will ambulate at least 10 feet with min A using LRAD to improve mobility for household and community distances.    Baseline  unable to take steps (06/12/2018); able to shift R leg forward and back with max A and RW at edge of plinth (06/19/2018); 07/08/18: unable to ambulate at this time. 09/11/18: unable to perform at this time; 10/23/2018: unable to perform at this time; 10/12: unable to perform at this time, 10/29: unable at this time; 01/13/19: unable at this time, 2/11: min A to step with RLE, unable to step with LLE, 3/10: able to take 2 steps in parallel bars with min A for safety; 4/1: min A for walking 10 feet in parallel bars, 5/24: min A walking 10 feet in parallel bars;    Time  12    Period  Weeks    Status  Partially Met    Target Date  08/20/19      PT LONG TERM GOAL #6  Title  Patient will be able to safety navigate up/down ramp with power chair, exhibiting good safety awareness, mod I to safely enter/exit his home.    Baseline  2/11: supervision with intermittent min A;4/1: supervision; 5/24: supervision    Time  12    Period  Weeks    Status  Partially Met    Target  Date  08/20/19      PT LONG TERM GOAL #7   Title  Patient will increase functional reach test to >15 inches in sitting to exhibit improved sitting balance and positioning and reduce fall risk;    Baseline  07/30/18: 12 inches; 09/11/18: 10-12 inches; 10/23/18: 12-13 inch; 10/12: 14.5", 10/29: 15.5 inch    Time  4    Period  Weeks    Status  Achieved      PT LONG TERM GOAL #8   Title  Patient will increase LLE quad strength to 3+/5 to improve functional strength for standing and mobility;    Baseline  10/28: 2+/5; 01/13/19: 2+/5, 2/11: 3-/5, 04/16/19: 3-/5, 05/08/19: 3-/5, 5/24: 3-/5    Time  12    Period  Weeks    Status  Partially Met    Target Date  08/20/19      PT LONG TERM GOAL  #9   TITLE  Patient will report no vertigo with provoking motions or positions in order to be able to perform ADLs and mobility tasks without symptoms.    Baseline  06/30/19: reports no dizziness in last week;    Time  12    Period  Weeks    Status  New    Target Date  08/20/19            Plan - 07/15/19 0908    Clinical Impression Statement  Patient motivated and participated well within session. he was able to exhibit better weight shift this session being able to progress in standing position. Patient progressed weight bearing activities to gait training this session being able to take several steps in parallel bars with min A for safety. He does require cues for weight shift for better step length on RLE. He is unable to initiate movement in LLE requiring assistance for step during gait tasks. patient does fatigue quickly requiring short seated rest breaks. he would benefit from additional skilled PT intervention to improve strength and mobility.    Personal Factors and Comorbidities  Age;Comorbidity 3+    Comorbidities  Relevant past medical history and comorbidities include long term steroid use for lupus, CKD, chronic osteomyelitis, cervical spine stenosis, BPH, APS, alcohol abuse, peripheral artery  disease, depression, GERD, obstructive sleep apnea, HTN, IBS, IgA deficiency, lumbar radiculopathy, RA, Stroke, toxic maculopathy in both eyes, systemic lupus erythematosus related syndrome, cardiac catheterization, carotid endarterectomy, cervical laminectomy, coronary angioplasty, Fusion C5-C7, knee arthroplasty, lumbar surgery.    Examination-Activity Limitations  Bed Mobility;Bend;Sit;Toileting;Stand;Stairs;Lift;Transfers;Squat;Locomotion Level;Carry;Dressing;Hygiene/Grooming;Continence    Examination-Participation Restrictions  Yard Work;Interpersonal Relationship;Community Activity    Stability/Clinical Decision Making  Evolving/Moderate complexity    Rehab Potential  Fair    PT Frequency  3x / week    PT Duration  12 weeks    PT Treatment/Interventions  ADLs/Self Care Home Management;Electrical Stimulation;Moist Heat;Cryotherapy;Gait training;Stair training;Functional mobility training;Therapeutic exercise;Balance training;Neuromuscular re-education;Cognitive remediation;Patient/family education;Orthotic Fit/Training;Wheelchair mobility training;Manual techniques;Passive range of motion;Energy conservation;Joint Manipulations;Canalith Repostioning;Therapeutic activities;Vestibular    PT Next Visit Plan  Work on strength, sitting/standing balance, functional activities such as rolling, bed mobility, transfers; caregiver training    PT [place]  no updates this session    Consulted and Agree with Plan of Care  Patient;Family member/caregiver    Family Member Consulted  Wife       Patient will benefit from skilled therapeutic intervention in order to improve the following deficits and impairments:  Abnormal gait, Decreased activity tolerance, Decreased cognition, Decreased endurance, Decreased knowledge of use of DME, Decreased range of motion, Decreased skin integrity, Decreased strength, Impaired perceived functional ability, Impaired sensation, Impaired UE functional use, Improper  body mechanics, Pain, Cardiopulmonary status limiting activity, Decreased balance, Decreased coordination, Decreased mobility, Difficulty walking, Impaired tone, Postural dysfunction, Dizziness  Visit Diagnosis: Muscle weakness (generalized)  Other lack of coordination  Difficulty in walking, not elsewhere classified  Repeated falls     Problem List Patient Active Problem List   Diagnosis Date Noted  . Sepsis ([place]) 10/18/2018    Jaamal Farooqui PT, DPT 07/15/2019, 9:20 AM  [place] MAIN [facility] SERVICES [address] [place], [place], [address] Phone: [phone]   Fax:  [phone]  Name: AUDI WETTSTEIN MRN: [id number] Date of Birth: 06-Nov-1945

## 2019-07-14 NOTE — Therapy (Signed)
[place] MAIN [facility] SERVICES [address] [place], [place], [address] Phone: [phone]   Fax:  [phone]  Occupational Therapy Treatment  Patient Details  Name: Kenneth Summers MRN: [id number] Date of Birth: 03/22/1945 No data recorded  Encounter Date: 07/14/2019  OT End of Session - 07/14/19 1356    Visit Number  [id number]    Number of Visits  157    Date for OT Re-Evaluation  09/15/19    Authorization Type  Progress report period starting 12/26/2018    OT Start Time  1350    OT Stop Time  1430    OT Time Calculation (min)  40 min    Activity Tolerance  Patient tolerated treatment well;Patient limited by fatigue    Behavior During Therapy  [facility] for tasks assessed/performed       Past Medical History:  Diagnosis Date  . Actinic keratosis   . Alcohol abuse   . APS (antiphospholipid syndrome) ([place])   . Basal cell carcinoma   . BPH (benign prostatic hyperplasia)   . CAD (coronary artery disease)   . Cellulitis   . Cervical spinal stenosis   . Chronic osteomyelitis ([place])   . CKD (chronic kidney disease)   . CKD (chronic kidney disease)   . Clostridium difficile diarrhea   . Depression   . Diplopia   . Fatigue   . GERD (gastroesophageal reflux disease)   . Hyperlipemia   . Hypertension   . Hypovitaminosis D   . IBS (irritable bowel syndrome)   . 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 Insomnia   . Left lumbosacral radiculopathy   . Moderate obstructive sleep apnea   . Osteomyelitis of foot ([place])   . PAD (peripheral artery disease) ([place])   . Pruritus   . RA (rheumatoid arthritis) ([place])   . Radiculopathy   . Restless legs syndrome   . RLS (restless legs syndrome)   . SLE (systemic lupus erythematosus related syndrome) ([place])   . 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 Stroke ([place])   . Toxic maculopathy of both eyes     Past Surgical History:  Procedure Laterality Date  . CARDIAC CATHETERIZATION    .  CAROTID ENDARTERECTOMY    . CERVICAL LAMINECTOMY    . CORONARY ANGIOPLASTY    . FRACTURE SURGERY    . fusion C5-6-7    . HERNIA REPAIR    . KNEE ARTHROSCOPY    . TONSILLECTOMY      There were no vitals filed for this visit.  Subjective Assessment - 07/14/19 1355    Subjective   Pt. reports no pain today    Patient is accompanied by:  Family member    Patient Stated Goals  To regain use of his left UE, and do more for himself.    Currently in Pain?  No/denies      OT TREATMENT   TherapeuticActivities:  Pt. worked on Financial risk analyst PCP Administrator, arts. Pt. worked on Administrator, arts patterns to construct a PCP pipe tower. Pt. required less cuing today, and presented with improved task initiation with his LUE.  with his bilateral UEs when constructing the tower. Pt. required cues to find, and initiate corrections on the design pattern. Pt. Required increased cues to incorporate his left hand, and engage his trunk during the task. Pt required max cues to engage his LUE, and trunk during tasks.  Pt. required increased verbal cues to complete the  task today, and fewer cues were required to incorporate his Left UE during the task. Pt. required verbal cues to copy the design pattern, and required consistent verbal cues for directions. Pt. continues to make steady progress overall. Pt. continues to work on improving LUE functioning, left sided awareness, and processing skills needed for ADL, and IADL functioning.                      OT Education - 07/14/19 1356    Education Details  Left UE functioning, left sided awareness.    Person(s) Educated  Patient    Methods  Explanation;Demonstration    Comprehension  Returned demonstration;Tactile cues required;Verbalized understanding;Need further instruction          OT Long Term Goals - 06/23/19 1414      OT LONG TERM GOAL #1   Title  Pt. will increase left shoulder flexion AROM by 10 degrees to access cabinet/shelf.     Baseline  Shoulder flexion:130. Pt. is progressing, and continues to work on reaching with increasing emphasis on flexing trunk to improve functional reach.    Time  12    Period  Weeks    Status  Partially Met    Target Date  09/15/19      OT LONG TERM GOAL #2   Title  Pt. will donn a shirt with Supervision.    Baseline  Pt. continues to require MinA donning a zip down jacket with consistent cues to initiate the correct direction of the jacket.    Time  12    Status  On-going    Target Date  09/15/19      OT LONG TERM GOAL #4   Title  Pt. will improve left grip strength by 10# to be able to open a jar/container.    Baseline  Pt. continues to work towards opening containers, and bottles.    Time  12    Period  Weeks    Status  On-going    Target Date  09/15/19      OT LONG TERM GOAL #5   Title  Pt. will improve left hand [facility] skills to be able to assist with buttoning/zipping.    Baseline  Pt. is improving with manipulating the zipper on his jacket, however continues to require practice. Pt. continues to work on improving bilateral [facility] skills for buttoning/zipping    Time  12    Period  Weeks    Status  On-going    Target Date  09/15/19      OT LONG TERM GOAL #6   Title  Pt. will demonstrate visual conmpensatory strategies for 100% of the time during ADLs    Baseline  Pt. requires  fewer cues for left sides awareness.    Time  12    Period  Weeks    Status  Partially Met    Target Date  09/15/19      OT LONG TERM GOAL #8   Title  Pt. will accurately identify potential safety hazard using good safety awareness, and judgement 100% for ADLs, and IADLs.    Baseline  Pt. requires cues    Time  12    Period  Weeks    Status  New    Target Date  09/15/19      OT LONG TERM GOAL  #9   TITLE  Pt. will  navigate his w/c around obstacles with Supervision and 100% accuracy    Baseline  Pt. continues to require cuing for obstacle on the left, however requires less cues.    Time   12    Period  Weeks    Status  On-going    Target Date  09/15/19      OT LONG TERM GOAL  #11   TITLE  Pt. will increase BUE strength by 20m grades in preparation for ADL transfers.    Baseline  Pt. is improving with LUE strength    Time  12    Period  Weeks    Status  On-going    Target Date  09/15/19      OT LONG TERM GOAL  #12   TITLE  Pt. will improve LUE functional reaching with the left engaging the trunk 100% of the time during ADL tasks with minimal cues.    Baseline  06/23/2019: Pt. is improving with LUE functional reachin, howwver requires cues to engage trunk for extended reaching.    Time  12    Period  Weeks    Status  On-going    Target Date  09/15/19            Plan - 07/14/19 1357    Clinical Impression Statement  Pt. required increased verbal cues to complete the task today, and fewer cues were required to incorporate his Left UE during the task. Pt. required verbal cues to copy the design pattern, and required consistent verbal cues for directions. Pt. continues to make steady progress overall. Pt. continues to work on improving LUE functioning, left sided awareness, and processing skills needed for ADL, and IADL functioning.   OT Occupational Profile and History  Problem Focused Assessment - Including review of records relating to presenting problem    Occupational performance deficits (Please refer to evaluation for details):  ADL's    Body Structure / Function / Physical Skills  UE functional use;Coordination;FMC;Dexterity;Strength;ROM    Cognitive Skills  Attention;Memory;Emotional;Problem Solve;Safety Awareness    Rehab Potential  Good    Clinical Decision Making  Several treatment options, min-mod task modification necessary    Comorbidities Affecting Occupational Performance:  Presence of comorbidities impacting occupational performance    Comorbidities impacting occupational performance description:  Phyical, cognitive, visual,  medical comorbidities     Modification or Assistance to Complete Evaluation   No modification of tasks or assist necessary to complete eval    OT Frequency  2x / week    OT Treatment/Interventions  Self-care/ADL training;DME and/or AE instruction;Therapeutic exercise;Therapeutic activities;Moist Heat;Cognitive remediation/compensation;Neuromuscular education;Visual/perceptual remediation/compensation;Coping strategies training;Patient/family education;Passive range of motion;Psychosocial skills training;Energy conservation;Functional Mobility Training    Consulted and Agree with Plan of Care  Patient       Patient will benefit from skilled therapeutic intervention in order to improve the following deficits and impairments:   Body Structure / Function / Physical Skills: UE functional use, Coordination, FMC, Dexterity, Strength, ROM Cognitive Skills: Attention, Memory, Emotional, Problem Solve, Safety Awareness     Visit Diagnosis: Muscle weakness (generalized)  Other lack of coordination    Problem List Patient Active Problem List   Diagnosis Date Noted  . Sepsis ([place] 10/18/2018    EHarrel Carina MS, OTR/L 07/14/2019, 2:01 PM  [place] [facility] [address] [address]Phone: [phone]  Fax:  [phone] Name: Kenneth BONNMRN: [id number] of Birth: 111/20/1947

## 2019-07-16 ENCOUNTER — Encounter: Payer: Self-pay | Admitting: Physical Therapy

## 2019-07-16 ENCOUNTER — Ambulatory Visit: Payer: Medicare Other | Admitting: Physical Therapy

## 2019-07-16 ENCOUNTER — Other Ambulatory Visit: Payer: Self-pay

## 2019-07-16 DIAGNOSIS — R262 Difficulty in walking, not elsewhere classified: Secondary | ICD-10-CM

## 2019-07-16 DIAGNOSIS — M6281 Muscle weakness (generalized): Secondary | ICD-10-CM

## 2019-07-16 DIAGNOSIS — R296 Repeated falls: Secondary | ICD-10-CM

## 2019-07-16 DIAGNOSIS — R278 Other lack of coordination: Secondary | ICD-10-CM

## 2019-07-16 NOTE — Therapy (Signed)
[place] MAIN [facility] SERVICES [address] [place], [place], [address] Phone: [phone]   Fax:  [phone]  Physical Therapy Treatment  Patient Details  Name: Kenneth Summers MRN: [id number] Date of Birth: [birth year] No data recorded  Encounter Date: 07/16/2019  PT End of Session - 07/16/19 1252    Visit Number  [id number]    Number of Visits  180    Date for PT Re-Evaluation  08/20/19    Authorization Type  Medicare reporting period starting 09/11/18    Authorization Time Period  goals last assessed 05/26/19    PT Start Time  1302    PT Stop Time  1345    PT Time Calculation (min)  43 min    Equipment Utilized During Treatment  Gait belt    Activity Tolerance  Patient tolerated treatment well;No increased pain    Behavior During Therapy  WFL for tasks assessed/performed       Past Medical History:  Diagnosis Date  . Actinic keratosis   . Alcohol abuse   . APS (antiphospholipid syndrome) ([place])   . Basal cell carcinoma   . BPH (benign prostatic hyperplasia)   . CAD (coronary artery disease)   . Cellulitis   . Cervical spinal stenosis   . Chronic osteomyelitis ([place])   . CKD (chronic kidney disease)   . CKD (chronic kidney disease)   . Clostridium difficile diarrhea   . Depression   . Diplopia   . Fatigue   . GERD (gastroesophageal reflux disease)   . Hyperlipemia   . Hypertension   . Hypovitaminosis D   . IBS (irritable bowel syndrome)   . 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 Insomnia   . Left lumbosacral radiculopathy   . Moderate obstructive sleep apnea   . Osteomyelitis of foot ([place])   . PAD (peripheral artery disease) ([place])   . Pruritus   . RA (rheumatoid arthritis) ([place])   . Radiculopathy   . Restless legs syndrome   . RLS (restless legs syndrome)   . SLE (systemic lupus erythematosus related syndrome) ([place])   . 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 Stroke ([place])   . Toxic maculopathy of both  eyes     Past Surgical History:  Procedure Laterality Date  . CARDIAC CATHETERIZATION    . CAROTID ENDARTERECTOMY    . CERVICAL LAMINECTOMY    . CORONARY ANGIOPLASTY    . FRACTURE SURGERY    . fusion C5-6-7    . HERNIA REPAIR    . KNEE ARTHROSCOPY    . TONSILLECTOMY      There were no vitals filed for this visit.  Subjective Assessment - 07/16/19 1319    Subjective  Patient reports increased fatigue today; He also reports increased LUE hand pain this morning and states he had to have cream and take pain medication because his hand was hurting so bad;    Patient is accompained by:  Family member    Pertinent History  Patient is a 74 y.o. male who presents to outpatient physical therapy with a referral for medical diagnosis of CVA. This patient's chief complaints consist of left hemiplegia and overall deconditioning leading to the following functional deficits: dependent for ADLs, IADLs, unable to transfer to car, dependent transfers at home with hoyer lift, unable to walk or stand without significant assistance, difficulty with W/C navigation..    Limitations  Sitting;Lifting;Standing;House hold activities;Writing;Walking;Other (comment)  Currently in Pain?  Yes    Pain Score  4     Pain Location  Hand    Pain Orientation  Left    Pain Descriptors / Indicators  Aching;Sore    Pain Type  Chronic pain    Pain Onset  More than a month ago    Pain Frequency  Intermittent    Aggravating Factors   unsure    Pain Relieving Factors  medication    Effect of Pain on Daily Activities  decreased activity tolerance;    Multiple Pain Sites  No             TREATMENT:  Prior to transfersand in between transfers: Patient sittingin wheelchair: -hip flexion marchx10 reps bilaterally Seated LAQ x10 reps bilaterally  Ptrequires verbal and visual cues to improve ROM;he had a harder time with trunk control with heavy right side lean;  Following exercise: Transferred sit<>Stand  in parallel bars (seat adjusted to 27 inch from floor height) x6reps throughout session;He was able to transfermin A to CGA/close supervision withmodVCs for forward weight shift for better trunk control and transfer ability;  Standing in parallel bars: Standing,min Awith min VCs for weight shift and increased extension for better stance control; -Mini squat 2x5 reps with min A and min VCs for weight shift and to increase LE muscle activation; -weight shift side/side x5reps withCGA and min VCs for erect posture and increased LLE knee extension in stance; -weight shift to neutral, with cues for hip extensionand to shift to neutral without heavy right lateral lean  Patient initially exhibits flexed posture with decreased hip extension;he also exhibits heavy lean to right side requiring instruction for weight shift to neutral position;    Patient requiredmodA to scoot back in chair at end of session due to increased fatigue  Response to treatment: [place].Patient heavily fatigued at end of session. He had a harder time weight shifting to neutral this session exhibiting a heavier posterior lean. Patient also exhibits decreased AROM in LLE this session. Concerned that hand pain and fatigue could be affecting mobility this session.                     PT Education - 07/16/19 1252    Education Details  LE exercise, strengthening, posture/transfers, HEP    Person(s) Educated  Patient    Methods  Explanation;Verbal cues    Comprehension  Verbalized understanding;Returned demonstration;Verbal cues required;Need further instruction       PT Short Term Goals - 05/28/19 0717      PT SHORT TERM GOAL #1   Title  Be independent with initial home exercise program for self-management of symptoms.    Baseline  HEP to be given at second session; advice to practice unsupported sitting at home (06/19/2018); 07/08/18: Performing at home, 6/29: needs  assistance but is doing exercise program regularly; 08/22/18: adherent;    Time  2    Period  Weeks    Status  Achieved    Target Date  06/26/18        PT Long Term Goals - 06/30/19 1330      PT LONG TERM GOAL #1   Title  Patient will complete all bed mobility with min A to improve functional independence for getting in and out of bed and adjusting in bed.     Baseline  max A - total A reported by family (06/19/2018); 07/08/18: still requires maxA, dtr reports improvement in rolling since starting therapy; 6/23: min  A to supervision in clinic; not doing at home right; 8/5: Pt's daughter states that his rolling has improved, but still has difficulty with all other bed mobility; 10/23/18: pts daughter states that she is doing about 35% of the work if he is in proper position, he does uses the bed rails to help roll;  using Harrel Lemon for coming to sit EOB; 10/12: pts daughter states that she is doing about 30% of the work if he is in proper position, he does uses the bed rails to help roll, but it is improving;  using Hoyer for coming to sit EOB, 10/29: min A for sit to sidelying, rolling supervision, able to transition sidelying to sit with HHA;    Time  12    Period  Weeks    Status  Achieved      PT LONG TERM GOAL #2   Title  Patient will complete sit <> stand transfer chair to chair with Min A and LRAD to improve functional independence for household and community mobility.     Baseline  supervision for STS, still requires elevated surface height to 27"    Time  12    Period  Weeks    Status  Achieved      PT LONG TERM GOAL #3   Title  Patient will navigate power w/c with min A x 100 feet to improve mobility for household and short community distances.    Baseline  required total A (06/12/2018); able to roll 20 feet with min A (06/20/2018); 07/08/18: Pt can go approximately 10' without assist per family;  10/23/2018: pt's daughter states that his power WC mobility has become 100% better with the new  hand control, slowed speed, and with practice; 10/12: pt's daughter reports that he can perform household WC mobility and limited community ambulation with minA    Time  12    Period  Weeks    Status  Achieved      PT LONG TERM GOAL #4   Title  Patient will complete car transfers W/C to family SUV with min A using LRAD to improve his abilty to participate in community activities.     Baseline  unable to complete car transfers at all (06/12/2018); unable to complete car transfers at all (06/19/2018).; 07/08/18: Unable to perform at this time; 09/11/18: unable to perform at this time; 10/23/2018: unable to complete car transfers at all; 10/12:  unable to complete car transfers at all, 10/29: unable; 01/13/19: unable; 2/11: min-mod A +2, 3/10: min A+1    Time  12    Period  Weeks    Status  Achieved      PT LONG TERM GOAL #5   Title  Patient will ambulate at least 10 feet with min A using LRAD to improve mobility for household and community distances.    Baseline  unable to take steps (06/12/2018); able to shift R leg forward and back with max A and RW at edge of plinth (06/19/2018); 07/08/18: unable to ambulate at this time. 09/11/18: unable to perform at this time; 10/23/2018: unable to perform at this time; 10/12: unable to perform at this time, 10/29: unable at this time; 01/13/19: unable at this time, 2/11: min A to step with RLE, unable to step with LLE, 3/10: able to take 2 steps in parallel bars with min A for safety; 4/1: min A for walking 10 feet in parallel bars, 5/24: min A walking 10 feet in parallel bars;  Time  12    Period  Weeks    Status  Partially Met    Target Date  08/20/19      PT LONG TERM GOAL #6   Title  Patient will be able to safety navigate up/down ramp with power chair, exhibiting good safety awareness, mod I to safely enter/exit his home.    Baseline  2/11: supervision with intermittent min A;4/1: supervision; 5/24: supervision    Time  12    Period  Weeks    Status  Partially Met     Target Date  08/20/19      PT LONG TERM GOAL #7   Title  Patient will increase functional reach test to >15 inches in sitting to exhibit improved sitting balance and positioning and reduce fall risk;    Baseline  07/30/18: 12 inches; 09/11/18: 10-12 inches; 10/23/18: 12-13 inch; 10/12: 14.5", 10/29: 15.5 inch    Time  4    Period  Weeks    Status  Achieved      PT LONG TERM GOAL #8   Title  Patient will increase LLE quad strength to 3+/5 to improve functional strength for standing and mobility;    Baseline  10/28: 2+/5; 01/13/19: 2+/5, 2/11: 3-/5, 04/16/19: 3-/5, 05/08/19: 3-/5, 5/24: 3-/5    Time  12    Period  Weeks    Status  Partially Met    Target Date  08/20/19      PT LONG TERM GOAL  #9   TITLE  Patient will report no vertigo with provoking motions or positions in order to be able to perform ADLs and mobility tasks without symptoms.    Baseline  06/30/19: reports no dizziness in last week;    Time  12    Period  Weeks    Status  New    Target Date  08/20/19            Plan - 07/16/19 1343    Clinical Impression Statement  Patient motivated and participated fair within session. He does exhibit decreased weight shift requiring increased assistance in stance for better neutral positioning. Patient fatigues quickly requiring short seated rest break. He was instructed in advanced LE strengthening exercises, exhibiting decreased AROM on LLE this session. he would benefit from additional skilled PT intervention to improve strength, balance and mobility;    Personal Factors and Comorbidities  Age;Comorbidity 3+    Comorbidities  Relevant past medical history and comorbidities include long term steroid use for lupus, CKD, chronic osteomyelitis, cervical spine stenosis, BPH, APS, alcohol abuse, peripheral artery disease, depression, GERD, obstructive sleep apnea, HTN, IBS, IgA deficiency, lumbar radiculopathy, RA, Stroke, toxic maculopathy in both eyes, systemic lupus erythematosus related  syndrome, cardiac catheterization, carotid endarterectomy, cervical laminectomy, coronary angioplasty, Fusion C5-C7, knee arthroplasty, lumbar surgery.    Examination-Activity Limitations  Bed Mobility;Bend;Sit;Toileting;Stand;Stairs;Lift;Transfers;Squat;Locomotion Level;Carry;Dressing;Hygiene/Grooming;Continence    Examination-Participation Restrictions  Yard Work;Interpersonal Relationship;Community Activity    Stability/Clinical Decision Making  Evolving/Moderate complexity    Rehab Potential  Fair    PT Frequency  3x / week    PT Duration  12 weeks    PT Treatment/Interventions  ADLs/Self Care Home Management;Electrical Stimulation;Moist Heat;Cryotherapy;Gait training;Stair training;Functional mobility training;Therapeutic exercise;Balance training;Neuromuscular re-education;Cognitive remediation;Patient/family education;Orthotic Fit/Training;Wheelchair mobility training;Manual techniques;Passive range of motion;Energy conservation;Joint Manipulations;Canalith Repostioning;Therapeutic activities;Vestibular    PT Next Visit Plan  Work on strength, sitting/standing balance, functional activities such as rolling, bed mobility, transfers; caregiver training    PT Home Exercise Plan  no updates this  session    Consulted and Agree with Plan of Care  Patient;Family member/caregiver    Family Member Consulted  Wife       Patient will benefit from skilled therapeutic intervention in order to improve the following deficits and impairments:  Abnormal gait, Decreased activity tolerance, Decreased cognition, Decreased endurance, Decreased knowledge of use of DME, Decreased range of motion, Decreased skin integrity, Decreased strength, Impaired perceived functional ability, Impaired sensation, Impaired UE functional use, Improper body mechanics, Pain, Cardiopulmonary status limiting activity, Decreased balance, Decreased coordination, Decreased mobility, Difficulty walking, Impaired tone, Postural dysfunction,  Dizziness  Visit Diagnosis: Muscle weakness (generalized)  Other lack of coordination  Difficulty in walking, not elsewhere classified  Repeated falls     Problem List Patient Active Problem List   Diagnosis Date Noted  . Sepsis ([place]) 10/18/2018    Jolina Symonds PT, DPT 07/16/2019, 1:46 PM  [place] MAIN [facility] SERVICES [address] [place], [place], [address] Phone: [phone]   Fax:  [phone]  Name: Kenneth Summers MRN: [id number] Date of Birth: [birth year]

## 2019-07-17 ENCOUNTER — Ambulatory Visit: Payer: Medicare Other | Admitting: Occupational Therapy

## 2019-07-17 ENCOUNTER — Ambulatory Visit: Payer: Medicare Other | Admitting: Physical Therapy

## 2019-07-17 ENCOUNTER — Encounter: Payer: Self-pay | Admitting: Occupational Therapy

## 2019-07-17 ENCOUNTER — Other Ambulatory Visit: Payer: Self-pay

## 2019-07-17 ENCOUNTER — Encounter: Payer: Self-pay | Admitting: Physical Therapy

## 2019-07-17 DIAGNOSIS — R296 Repeated falls: Secondary | ICD-10-CM

## 2019-07-17 DIAGNOSIS — M6281 Muscle weakness (generalized): Secondary | ICD-10-CM

## 2019-07-17 DIAGNOSIS — R278 Other lack of coordination: Secondary | ICD-10-CM

## 2019-07-17 DIAGNOSIS — R262 Difficulty in walking, not elsewhere classified: Secondary | ICD-10-CM

## 2019-07-17 NOTE — Therapy (Signed)
[place] MAIN [facility] SERVICES [address] [place], [place], [address] Phone: [phone]   Fax:  [phone]  Occupational Therapy Treatment  Patient Details  Name: Kenneth Summers MRN: [id number] Date of Birth: 1946-01-20 No data recorded  Encounter Date: 07/17/2019   OT End of Session - 07/17/19 1402    Visit Number 119    Number of Visits 157    Date for OT Re-Evaluation 09/15/19    Authorization Type Progress report period starting 12/26/2018    OT Start Time 1352    OT Stop Time 1430    OT Time Calculation (min) 38 min    Activity Tolerance Patient tolerated treatment well;Patient limited by fatigue    Behavior During Therapy [facility] for tasks assessed/performed           Past Medical History:  Diagnosis Date  . Actinic keratosis   . Alcohol abuse   . APS (antiphospholipid syndrome) ([place])   . Basal cell carcinoma   . BPH (benign prostatic hyperplasia)   . CAD (coronary artery disease)   . Cellulitis   . Cervical spinal stenosis   . Chronic osteomyelitis ([place])   . CKD (chronic kidney disease)   . CKD (chronic kidney disease)   . Clostridium difficile diarrhea   . Depression   . Diplopia   . Fatigue   . GERD (gastroesophageal reflux disease)   . Hyperlipemia   . Hypertension   . Hypovitaminosis D   . IBS (irritable bowel syndrome)   . 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 Insomnia   . Left lumbosacral radiculopathy   . Moderate obstructive sleep apnea   . Osteomyelitis of foot ([place])   . PAD (peripheral artery disease) ([place])   . Pruritus   . RA (rheumatoid arthritis) ([place])   . Radiculopathy   . Restless legs syndrome   . RLS (restless legs syndrome)   . SLE (systemic lupus erythematosus related syndrome) ([place])   . 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 Stroke ([place])   . Toxic maculopathy of both eyes     Past Surgical History:  Procedure Laterality Date  . CARDIAC CATHETERIZATION    .  CAROTID ENDARTERECTOMY    . CERVICAL LAMINECTOMY    . CORONARY ANGIOPLASTY    . FRACTURE SURGERY    . fusion C5-6-7    . HERNIA REPAIR    . KNEE ARTHROSCOPY    . TONSILLECTOMY      There were no vitals filed for this visit.   Subjective Assessment - 07/17/19 1400    Patient is accompanied by: Kenneth Summers    Pertinent History Pt. is a 74 y.o. 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 Pt. PMHx includes: Multiple Falls, Lupus, DJD, Renal Abscess, CVA. Pt. resides with his wife. Pt.'s wife and daughter assist with ADLs. Pt. has caregivers in  for 2 hours a day, 6 days a week. Pt. received Rehab services in acute care, at SNF for STR, and [place] services. Pt. is retired from [employer] for [employer] and [employer].    Currently in Pain? No/denies          OT TREATMENT   Neuro muscular re-education:  Pt. worked on using his LUE for reaching, grasping, and flipping [place] style discs. Pt. worked or reaching to the far left, and far right with trunk rotation when flipping 60 discs lined up into  4 rows of 15 discs. Pt. worked on using his left hand to stack the discsin groups of 3, and place them into the case.Pt. Worked on bilateral hand coordination skills flipping the discs, and simultaneously flipping them with the right, and left hands starting at the far left, and far right moving towards center. Pt was unable to complete the task simultaneously, and alternated between the right, and left.  Kenneth Summerspresents with clonus in the left hand when attempting to reach out for objects. Pt. Required less  verbal cues to use his left hand, more cues for rest breaks secondary to fatigue, and increased time to complete the task today, as well as forward flexing with the trunk. Pt. continues to require cues to engage his LUE in, and consistently use his left UEhowever required significantly less cuing.Kenneth Summersrequired more cuing to engage his trunk when flexing  forward with reaching.Kenneth Summerscontinues to work on improving LUE functioning, left sidedawareness,trunk control,and processing skills needed for ADL, and IADL functioning.                      OT Education - 07/17/19 1401    Education Details Left UE functioning, left sided awareness.    Person(s) Educated Patient    Methods Explanation;Demonstration    Comprehension Returned demonstration;Tactile cues required;Verbalized understanding;Need further instruction               OT Long Term Goals - 06/23/19 1414      OT LONG TERM GOAL #1   Title Pt. will increase left shoulder flexion AROM by 10 degrees to access cabinet/shelf.    Baseline Shoulder flexion:130. Pt. is progressing, and continues to work on reaching with increasing emphasis on flexing trunk to improve functional reach.    Time 12    Period Weeks    Status Partially Met    Target Date 09/15/19      OT LONG TERM GOAL #2   Title Pt. will donn a shirt with Supervision.    Baseline Pt. continues to require MinA donning a zip down jacket with consistent cues to initiate the correct direction of the jacket.    Time 12    Status On-going    Target Date 09/15/19      OT LONG TERM GOAL #4   Title Pt. will improve left grip strength by 10# to be able to open a jar/container.    Baseline Pt. continues to work towards opening containers, and bottles.    Time 12    Period Weeks    Status On-going    Target Date 09/15/19      OT LONG TERM GOAL #5   Title Pt. will improve left hand [facility] skills to be able to assist with buttoning/zipping.    Baseline Pt. is improving with manipulating the zipper on his jacket, however continues to require practice. Pt. continues to work on improving bilateral [facility] skills for buttoning/zipping    Time 12    Period Weeks    Status On-going    Target Date 09/15/19      OT LONG TERM GOAL #6   Title Pt. will demonstrate visual conmpensatory strategies for 100% of the time  during ADLs    Baseline Pt. requires  fewer cues for left sides awareness.    Time 12    Period Weeks    Status Partially Met    Target Date 09/15/19      OT LONG TERM GOAL #8   Title Pt. will  accurately identify potential safety hazard using good safety awareness, and judgement 100% for ADLs, and IADLs.    Baseline Pt. requires cues    Time 12    Period Weeks    Status New    Target Date 09/15/19      OT LONG TERM GOAL  #9   TITLE Pt. will  navigate his w/c around obstacles with Supervision and 100% accuracy    Baseline Pt. continues to require cuing for obstacle on the left, however requires less cues.    Time 12    Period Weeks    Status On-going    Target Date 09/15/19      OT LONG TERM GOAL  #11   TITLE Pt. will increase BUE strength by 29m grades in preparation for ADL transfers.    Baseline Pt. is improving with LUE strength    Time 12    Period Weeks    Status On-going    Target Date 09/15/19      OT LONG TERM GOAL  #12   TITLE Pt. will improve LUE functional reaching with the left engaging the trunk 100% of the time during ADL tasks with minimal cues.    Baseline 06/23/2019: Pt. is improving with LUE functional reachin, howwver requires cues to engage trunk for extended reaching.    Time 12    Period Weeks    Status On-going    Target Date 09/15/19                 Plan - 07/17/19 1403    Clinical Impression Statement Kenneth Summerspresents with clonus in the left hand when attempting to reach out for objects. Pt. Required less  verbal cues to use his left hand, more cues for rest breaks secondary to fatigue, and increased time to complete the task today, as well as forward flexing with the trunk. Pt. continues to require cues to engage his LUE in, and consistently use his left UEhowever required significantly less cuing.Kenneth Summersrequired more cuing to engage his trunk when flexing forward with reaching.Kenneth Summerscontinues to work on improving LUE functioning, left  sidedawareness,trunk control,and processing skills needed for ADL, and IADL functioning.   OT Occupational Profile and History Problem Focused Assessment - Including review of records relating to presenting problem    Occupational performance deficits (Please refer to evaluation for details): ADL's    Body Structure / Function / Physical Skills UE functional use;Coordination;FMC;Dexterity;Strength;ROM    Cognitive Skills Attention;Memory;Emotional;Problem Solve;Safety Awareness    Rehab Potential Good    Clinical Decision Making Several treatment options, min-mod task modification necessary    Comorbidities Affecting Occupational Performance: Presence of comorbidities impacting occupational performance    Comorbidities impacting occupational performance description: Phyical, cognitive, visual,  medical comorbidities    Modification or Assistance to Complete Evaluation  No modification of tasks or assist necessary to complete eval    OT Frequency 2x / week    OT Duration 12 weeks    OT Treatment/Interventions Self-care/ADL training;DME and/or AE instruction;Therapeutic exercise;Therapeutic activities;Moist Heat;Cognitive remediation/compensation;Neuromuscular education;Visual/perceptual remediation/compensation;Coping strategies training;Patient/Kenneth education;Passive range of motion;Psychosocial skills training;Energy conservation;Functional Mobility Training    Consulted and Agree with Plan of Care Patient           Patient will benefit from skilled therapeutic intervention in order to improve the following deficits and impairments:   Body Structure / Function / Physical Skills: UE functional use, Coordination, FMC, Dexterity, Strength, ROM Cognitive Skills: Attention, Memory, Emotional, Problem Solve, Safety Awareness     Visit Diagnosis:  Muscle weakness (generalized)  Other lack of coordination    Problem List Patient Active Problem List   Diagnosis Date Noted  . Sepsis  ([place]) 10/18/2018    Harrel Carina, MS, OTR/L 07/17/2019, 2:08 PM  [place] MAIN [facility] SERVICES [address] [place], [place], [address] Phone: [phone]   Fax:  [phone]  Name: ARMANII PRESSNELL MRN: [id number] Date of Birth: February 02, 1946

## 2019-07-17 NOTE — Therapy (Signed)
[place] MAIN [facility] SERVICES [address] [place], [place], [address] Phone: [phone]   Fax:  [phone]  Physical Therapy Treatment  Patient Details  Name: Kenneth Summers MRN: [id number] Date of Birth: 12/27/1945 No data recorded  Encounter Date: 07/17/2019   PT End of Session - 07/17/19 1300    Visit Number [id number]    Number of Visits 180    Date for PT Re-Evaluation 08/20/19    Authorization Type Medicare reporting period starting 09/11/18    Authorization Time Period goals last assessed 05/26/19    PT Start Time 1302    PT Stop Time 1345    PT Time Calculation (min) 43 min    Equipment Utilized During Treatment Gait belt    Activity Tolerance Patient tolerated treatment well;No increased pain    Behavior During Therapy WFL for tasks assessed/performed           Past Medical History:  Diagnosis Date  . Actinic keratosis   . Alcohol abuse   . APS (antiphospholipid syndrome) ([place])   . Basal cell carcinoma   . BPH (benign prostatic hyperplasia)   . CAD (coronary artery disease)   . Cellulitis   . Cervical spinal stenosis   . Chronic osteomyelitis ([place])   . CKD (chronic kidney disease)   . CKD (chronic kidney disease)   . Clostridium difficile diarrhea   . Depression   . Diplopia   . Fatigue   . GERD (gastroesophageal reflux disease)   . Hyperlipemia   . Hypertension   . Hypovitaminosis D   . IBS (irritable bowel syndrome)   . 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 Insomnia   . Left lumbosacral radiculopathy   . Moderate obstructive sleep apnea   . Osteomyelitis of foot ([place])   . PAD (peripheral artery disease) ([place])   . Pruritus   . RA (rheumatoid arthritis) ([place])   . Radiculopathy   . Restless legs syndrome   . RLS (restless legs syndrome)   . SLE (systemic lupus erythematosus related syndrome) ([place])   . 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 Stroke ([place])   . Toxic maculopathy of both eyes      Past Surgical History:  Procedure Laterality Date  . CARDIAC CATHETERIZATION    . CAROTID ENDARTERECTOMY    . CERVICAL LAMINECTOMY    . CORONARY ANGIOPLASTY    . FRACTURE SURGERY    . fusion C5-6-7    . HERNIA REPAIR    . KNEE ARTHROSCOPY    . TONSILLECTOMY      There were no vitals filed for this visit.   Subjective Assessment - 07/17/19 1305    Subjective Patient reports less fatigue and less hand pain today; He does report having a history of sleep apnea and states that could be why he's so tired.    Patient is accompained by: Family member    Pertinent History Patient is a 74 y.o. male who presents to outpatient physical therapy with a referral for medical diagnosis of CVA. This patient's chief complaints consist of left hemiplegia and overall deconditioning leading to the following functional deficits: dependent for ADLs, IADLs, unable to transfer to car, dependent transfers at home with hoyer lift, unable to walk or stand without significant assistance, difficulty with W/C navigation..    Limitations Sitting;Lifting;Standing;House hold activities;Writing;Walking;Other (comment)    Currently in Pain? No/denies    Pain Onset More than a month ago  Multiple Pain Sites No               TREATMENT: Prior to transfersand in between transfers: Patient sittingin wheelchair: -hip flexion marchx10 reps bilaterally -seated hip abduction LLE only x5 reps; Ptrequires verbal and visual cues to improve ROM;he had a harder time with trunk control with heavy right side lean;  Following exercise: Transferred sit<>Stand in parallel bars (seat adjusted to 27 inch from floor height) x8 reps throughout session;He was able to transfer CGA/close supervision withmodVCs for forward weight shift for better trunk control and transfer ability;  Standing in parallel bars: Standing,CGA to min A with min VCs for weight shift and increased extension for better stance  control; -Mini squat x10 reps with min A and min VCs for weight shift and to increase LE muscle activation; -weight shift side/side x10reps withCGA and min VCs for erect posture and increased LLE knee extension in stance; -weight shift to neutral, with cues for hip extensionand to shift to neutral without heavy right lateral lean -RLE toe taps to 4 inch step with B Rail assist, therapist blocking LLE knee for better stance control x10 reps; Required min A for safety with mod VCS for erect posture and increased hip extension;  -LLE attempted toe taps but able to lift LLE to partial toe tap x5 reps with min A for weight shift for better trunk control;   Patient initially exhibits flexed posture with decreased hip extension;he also exhibits heavy lean to right side requiring instruction for weight shift to neutral position;   Progressed to gait training: Patient ambulated in parallel bars with B rail assist, min A for safety including therapist blocking LLE knee during mid stance             x2 steps with seated rest breaks             x2-3 steps with seated rest break          Patient required max VCs for weight shift. He was able to advance LLE forward for partial step to pattern without therapist assistance;   Patient requiredmodA to scoot back in chair at end of session due to increased fatigue  Response to treatment: [place].Patient heavily fatigued at end of session. He was able to exhibit better trunk control this session with improved weight shift and transfer ability;                          PT Education - 07/17/19 1300    Education Details LE exercise, strengthening, posture/transfers/HEP    Person(s) Educated Patient    Methods Explanation;Verbal cues    Comprehension Verbalized understanding;Returned demonstration;Verbal cues required;Need further instruction            PT Short Term Goals - 05/28/19 0717      PT SHORT TERM  GOAL #1   Title Be independent with initial home exercise program for self-management of symptoms.    Baseline HEP to be given at second session; advice to practice unsupported sitting at home (06/19/2018); 07/08/18: Performing at home, 6/29: needs assistance but is doing exercise program regularly; 08/22/18: adherent;    Time 2    Period Weeks    Status Achieved    Target Date 06/26/18             PT Long Term Goals - 06/30/19 1330      PT LONG TERM GOAL #1   Title Patient will complete all bed  mobility with min A to improve functional independence for getting in and out of bed and adjusting in bed.     Baseline max A - total A reported by family (06/19/2018); 07/08/18: still requires maxA, dtr reports improvement in rolling since starting therapy; 6/23: min A to supervision in clinic; not doing at home right; 8/5: Pt's daughter states that his rolling has improved, but still has difficulty with all other bed mobility; 10/23/18: pts daughter states that she is doing about 35% of the work if he is in proper position, he does uses the bed rails to help roll;  using Hoyer for coming to sit EOB; 10/12: pts daughter states that she is doing about 30% of the work if he is in proper position, he does uses the bed rails to help roll, but it is improving;  using Hoyer for coming to sit EOB, 10/29: min A for sit to sidelying, rolling supervision, able to transition sidelying to sit with HHA;    Time 12    Period Weeks    Status Achieved      PT LONG TERM GOAL #2   Title Patient will complete sit <> stand transfer chair to chair with Min A and LRAD to improve functional independence for household and community mobility.     Baseline supervision for STS, still requires elevated surface height to 27"    Time 12    Period Weeks    Status Achieved      PT LONG TERM GOAL #3   Title Patient will navigate power w/c with min A x 100 feet to improve mobility for household and short community distances.     Baseline required total A (06/12/2018); able to roll 20 feet with min A (06/20/2018); 07/08/18: Pt can go approximately 10' without assist per family;  10/23/2018: pt's daughter states that his power WC mobility has become 100% better with the new hand control, slowed speed, and with practice; 10/12: pt's daughter reports that he can perform household WC mobility and limited community ambulation with minA    Time 12    Period Weeks    Status Achieved      PT LONG TERM GOAL #4   Title Patient will complete car transfers W/C to family SUV with min A using LRAD to improve his abilty to participate in community activities.     Baseline unable to complete car transfers at all (06/12/2018); unable to complete car transfers at all (06/19/2018).; 07/08/18: Unable to perform at this time; 09/11/18: unable to perform at this time; 10/23/2018: unable to complete car transfers at all; 10/12:  unable to complete car transfers at all, 10/29: unable; 01/13/19: unable; 2/11: min-mod A +2, 3/10: min A+1    Time 12    Period Weeks    Status Achieved      PT LONG TERM GOAL #5   Title Patient will ambulate at least 10 feet with min A using LRAD to improve mobility for household and community distances.    Baseline unable to take steps (06/12/2018); able to shift R leg forward and back with max A and RW at edge of plinth (06/19/2018); 07/08/18: unable to ambulate at this time. 09/11/18: unable to perform at this time; 10/23/2018: unable to perform at this time; 10/12: unable to perform at this time, 10/29: unable at this time; 01/13/19: unable at this time, 2/11: min A to step with RLE, unable to step with LLE, 3/10: able to take 2 steps in parallel bars  with min A for safety; 4/1: min A for walking 10 feet in parallel bars, 5/24: min A walking 10 feet in parallel bars;    Time 12    Period Weeks    Status Partially Met    Target Date 08/20/19      PT LONG TERM GOAL #6   Title Patient will be able to safety navigate up/down ramp with power  chair, exhibiting good safety awareness, mod I to safely enter/exit his home.    Baseline 2/11: supervision with intermittent min A;4/1: supervision; 5/24: supervision    Time 12    Period Weeks    Status Partially Met    Target Date 08/20/19      PT LONG TERM GOAL #7   Title Patient will increase functional reach test to >15 inches in sitting to exhibit improved sitting balance and positioning and reduce fall risk;    Baseline 07/30/18: 12 inches; 09/11/18: 10-12 inches; 10/23/18: 12-13 inch; 10/12: 14.5", 10/29: 15.5 inch    Time 4    Period Weeks    Status Achieved      PT LONG TERM GOAL #8   Title Patient will increase LLE quad strength to 3+/5 to improve functional strength for standing and mobility;    Baseline 10/28: 2+/5; 01/13/19: 2+/5, 2/11: 3-/5, 04/16/19: 3-/5, 05/08/19: 3-/5, 5/24: 3-/5    Time 12    Period Weeks    Status Partially Met    Target Date 08/20/19      PT LONG TERM GOAL  #9   TITLE Patient will report no vertigo with provoking motions or positions in order to be able to perform ADLs and mobility tasks without symptoms.    Baseline 06/30/19: reports no dizziness in last week;    Time 12    Period Weeks    Status New    Target Date 08/20/19                 Plan - 07/17/19 1323    Clinical Impression Statement Patient motivated and participated well within session; He was able to exhibit better trunk control today with neutral posture in stance; Patient progressing in LE strength being able to initiate movement in LLE this session. Patient also able to take a few steps with each LE with min A for trunk control. He does fatigue quickly requiring short seated rest breaks. He would benefit from additional skilled PT intervention to improve strength, balance and mobility;    Personal Factors and Comorbidities Age;Comorbidity 3+    Comorbidities Relevant past medical history and comorbidities include long term steroid use for lupus, CKD, chronic osteomyelitis,  cervical spine stenosis, BPH, APS, alcohol abuse, peripheral artery disease, depression, GERD, obstructive sleep apnea, HTN, IBS, IgA deficiency, lumbar radiculopathy, RA, Stroke, toxic maculopathy in both eyes, systemic lupus erythematosus related syndrome, cardiac catheterization, carotid endarterectomy, cervical laminectomy, coronary angioplasty, Fusion C5-C7, knee arthroplasty, lumbar surgery.    Examination-Activity Limitations Bed Mobility;Bend;Sit;Toileting;Stand;Stairs;Lift;Transfers;Squat;Locomotion Level;Carry;Dressing;Hygiene/Grooming;Continence    Examination-Participation Restrictions Yard Work;Interpersonal Relationship;Community Activity    Stability/Clinical Decision Making Evolving/Moderate complexity    Rehab Potential Fair    PT Frequency 3x / week    PT Duration 12 weeks    PT Treatment/Interventions ADLs/Self Care Home Management;Electrical Stimulation;Moist Heat;Cryotherapy;Gait training;Stair training;Functional mobility training;Therapeutic exercise;Balance training;Neuromuscular re-education;Cognitive remediation;Patient/family education;Orthotic Fit/Training;Wheelchair mobility training;Manual techniques;Passive range of motion;Energy conservation;Joint Manipulations;Canalith Repostioning;Therapeutic activities;Vestibular    PT Next Visit Plan Work on strength, sitting/standing balance, functional activities such as rolling, bed mobility, transfers; caregiver training  PT Home Exercise Plan no updates this session    Consulted and Agree with Plan of Care Patient;Family member/caregiver    Family Member Consulted Wife           Patient will benefit from skilled therapeutic intervention in order to improve the following deficits and impairments:  Abnormal gait, Decreased activity tolerance, Decreased cognition, Decreased endurance, Decreased knowledge of use of DME, Decreased range of motion, Decreased skin integrity, Decreased strength, Impaired perceived functional  ability, Impaired sensation, Impaired UE functional use, Improper body mechanics, Pain, Cardiopulmonary status limiting activity, Decreased balance, Decreased coordination, Decreased mobility, Difficulty walking, Impaired tone, Postural dysfunction, Dizziness  Visit Diagnosis: Muscle weakness (generalized)  Other lack of coordination  Difficulty in walking, not elsewhere classified  Repeated falls     Problem List Patient Active Problem List   Diagnosis Date Noted  . Sepsis ([place]) 10/18/2018    Kipp Shank PT, DPT 07/17/2019, 3:20 PM  [place] MAIN [facility] SERVICES [address] [place], [place], [address] Phone: [phone]   Fax:  [phone]  Name: Kenneth Summers MRN: [id number] Date of Birth: February 10, 1945

## 2019-07-21 ENCOUNTER — Ambulatory Visit: Payer: Medicare Other | Admitting: Occupational Therapy

## 2019-07-21 ENCOUNTER — Encounter: Payer: Self-pay | Admitting: Physical Therapy

## 2019-07-21 ENCOUNTER — Ambulatory Visit: Payer: Medicare Other | Admitting: Physical Therapy

## 2019-07-21 ENCOUNTER — Other Ambulatory Visit: Payer: Self-pay

## 2019-07-21 ENCOUNTER — Encounter: Payer: Self-pay | Admitting: Occupational Therapy

## 2019-07-21 DIAGNOSIS — M6281 Muscle weakness (generalized): Secondary | ICD-10-CM

## 2019-07-21 DIAGNOSIS — R278 Other lack of coordination: Secondary | ICD-10-CM

## 2019-07-21 DIAGNOSIS — I69354 Hemiplegia and hemiparesis following cerebral infarction affecting left non-dominant side: Secondary | ICD-10-CM

## 2019-07-21 DIAGNOSIS — R296 Repeated falls: Secondary | ICD-10-CM

## 2019-07-21 DIAGNOSIS — R262 Difficulty in walking, not elsewhere classified: Secondary | ICD-10-CM

## 2019-07-21 NOTE — Therapy (Signed)
[place] MAIN [facility] SERVICES [address] [place], [place], [address] Phone: [phone]   Fax:  [phone]  Physical Therapy Treatment  Patient Details  Name: Kenneth Summers MRN: [id number] Date of Birth: Sep 07, 1945 No data recorded  Encounter Date: 07/21/2019   PT End of Session - 07/21/19 1248    Visit Number [id number]    Number of Visits 180    Date for PT Re-Evaluation 08/20/19    Authorization Type Medicare reporting period starting 09/11/18    Authorization Time Period goals last assessed 05/26/19    PT Start Time 1302    PT Stop Time 1345    PT Time Calculation (min) 43 min    Equipment Utilized During Treatment Gait belt    Activity Tolerance Patient tolerated treatment well;No increased pain    Behavior During Therapy WFL for tasks assessed/performed           Past Medical History:  Diagnosis Date  . Actinic keratosis   . Alcohol abuse   . APS (antiphospholipid syndrome) ([place])   . Basal cell carcinoma   . BPH (benign prostatic hyperplasia)   . CAD (coronary artery disease)   . Cellulitis   . Cervical spinal stenosis   . Chronic osteomyelitis ([place])   . CKD (chronic kidney disease)   . CKD (chronic kidney disease)   . Clostridium difficile diarrhea   . Depression   . Diplopia   . Fatigue   . GERD (gastroesophageal reflux disease)   . Hyperlipemia   . Hypertension   . Hypovitaminosis D   . IBS (irritable bowel syndrome)   . 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 Insomnia   . Left lumbosacral radiculopathy   . Moderate obstructive sleep apnea   . Osteomyelitis of foot ([place])   . PAD (peripheral artery disease) ([place])   . Pruritus   . RA (rheumatoid arthritis) ([place])   . Radiculopathy   . Restless legs syndrome   . RLS (restless legs syndrome)   . SLE (systemic lupus erythematosus related syndrome) ([place])   . 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 Stroke ([place])   . Toxic maculopathy of both eyes      Past Surgical History:  Procedure Laterality Date  . CARDIAC CATHETERIZATION    . CAROTID ENDARTERECTOMY    . CERVICAL LAMINECTOMY    . CORONARY ANGIOPLASTY    . FRACTURE SURGERY    . fusion C5-6-7    . HERNIA REPAIR    . KNEE ARTHROSCOPY    . TONSILLECTOMY      There were no vitals filed for this visit.   Subjective Assessment - 07/21/19 1313    Subjective Patient reports doing well today; He reports having increased neck/hand pain over the weekend but denies any currently; Reports resting most of the weekend;    Patient is accompained by: Family member    Pertinent History Patient is a 74 y.o. male who presents to outpatient physical therapy with a referral for medical diagnosis of CVA. This patient's chief complaints consist of left hemiplegia and overall deconditioning leading to the following functional deficits: dependent for ADLs, IADLs, unable to transfer to car, dependent transfers at home with hoyer lift, unable to walk or stand without significant assistance, difficulty with W/C navigation..    Limitations Sitting;Lifting;Standing;House hold activities;Writing;Walking;Other (comment)    Currently in Pain? No/denies    Pain Onset More than a month ago  Multiple Pain Sites No                    TREATMENT: Prior to transfersand in between transfers: Patient sittingin wheelchair: -hip flexion marchx10reps bilaterally  Ptrequires verbal and visual cues to improve ROM;he had a harder time with trunk control with heavy right side lean;  Following exercise: Transferred sit<>Stand in parallel bars (seat adjusted to 27 inch from floor height) x9 reps throughout session;He was able to transfer CGA/close supervision although last transfer he required min A due to fatigue, required modVCs for forward weight shift for better trunk control and transfer ability;  Standing in parallel bars: Standing,CGA to min A with min VCs for weight shift and  increased extension for better stance control; -Mini squatx15 reps, x10 reps with CGA and min VCs for weight shift and to increase LE muscle activation; -weight shift side/side x10reps withCGA and min VCs for erect posture and increased LLE knee extension in stance; -weight shift to neutral, with cues for hip extensionand to shift to neutral without heavy right lateral lean -RLE toe taps to 4 inch step with B Rail assist, therapist blocking LLE knee for better stance control x15 reps (with 3 seated rest breaks); Required min A for safety with mod VCS for erect posture and increased hip extension; Pt able to exhibit better control and improved weight shift with increased repetitions; -LLE attempted toe taps but able to lift LLE to partial toe tap x5 reps with min A for weight shift for better trunk control;   Patient initially exhibits flexed posture with decreased hip extension;he also exhibits heavy lean to right side requiring instruction for weight shift to neutral position;    Patient requiredmodA to scoot back in chair at end of session due to increased fatigue  Response to treatment: [place].Patient heavily fatigued at end of session. He was able to exhibit better trunk control this session with improved weight shift and transfer ability;                       PT Education - 07/21/19 1247    Education Details LE exercise, strengthening, positioning/transfers/HEP    Person(s) Educated Patient;Child(ren)    Methods Explanation;Verbal cues    Comprehension Verbalized understanding;Returned demonstration;Verbal cues required;Need further instruction            PT Short Term Goals - 05/28/19 0717      PT SHORT TERM GOAL #1   Title Be independent with initial home exercise program for self-management of symptoms.    Baseline HEP to be given at second session; advice to practice unsupported sitting at home (06/19/2018); 07/08/18: Performing  at home, 6/29: needs assistance but is doing exercise program regularly; 08/22/18: adherent;    Time 2    Period Weeks    Status Achieved    Target Date 06/26/18             PT Long Term Goals - 06/30/19 1330      PT LONG TERM GOAL #1   Title Patient will complete all bed mobility with min A to improve functional independence for getting in and out of bed and adjusting in bed.     Baseline max A - total A reported by family (06/19/2018); 07/08/18: still requires maxA, dtr reports improvement in rolling since starting therapy; 6/23: min A to supervision in clinic; not doing at home right; 8/5: Pt's daughter states that his rolling has improved, but still has difficulty  with all other bed mobility; 10/23/18: pts daughter states that she is doing about 35% of the work if he is in proper position, he does uses the bed rails to help roll;  using Harrel Lemon for coming to sit EOB; 10/12: pts daughter states that she is doing about 30% of the work if he is in proper position, he does uses the bed rails to help roll, but it is improving;  using Hoyer for coming to sit EOB, 10/29: min A for sit to sidelying, rolling supervision, able to transition sidelying to sit with HHA;    Time 12    Period Weeks    Status Achieved      PT LONG TERM GOAL #2   Title Patient will complete sit <> stand transfer chair to chair with Min A and LRAD to improve functional independence for household and community mobility.     Baseline supervision for STS, still requires elevated surface height to 27"    Time 12    Period Weeks    Status Achieved      PT LONG TERM GOAL #3   Title Patient will navigate power w/c with min A x 100 feet to improve mobility for household and short community distances.    Baseline required total A (06/12/2018); able to roll 20 feet with min A (06/20/2018); 07/08/18: Pt can go approximately 10' without assist per family;  10/23/2018: pt's daughter states that his power WC mobility has become 100% better with  the new hand control, slowed speed, and with practice; 10/12: pt's daughter reports that he can perform household WC mobility and limited community ambulation with minA    Time 12    Period Weeks    Status Achieved      PT LONG TERM GOAL #4   Title Patient will complete car transfers W/C to family SUV with min A using LRAD to improve his abilty to participate in community activities.     Baseline unable to complete car transfers at all (06/12/2018); unable to complete car transfers at all (06/19/2018).; 07/08/18: Unable to perform at this time; 09/11/18: unable to perform at this time; 10/23/2018: unable to complete car transfers at all; 10/12:  unable to complete car transfers at all, 10/29: unable; 01/13/19: unable; 2/11: min-mod A +2, 3/10: min A+1    Time 12    Period Weeks    Status Achieved      PT LONG TERM GOAL #5   Title Patient will ambulate at least 10 feet with min A using LRAD to improve mobility for household and community distances.    Baseline unable to take steps (06/12/2018); able to shift R leg forward and back with max A and RW at edge of plinth (06/19/2018); 07/08/18: unable to ambulate at this time. 09/11/18: unable to perform at this time; 10/23/2018: unable to perform at this time; 10/12: unable to perform at this time, 10/29: unable at this time; 01/13/19: unable at this time, 2/11: min A to step with RLE, unable to step with LLE, 3/10: able to take 2 steps in parallel bars with min A for safety; 4/1: min A for walking 10 feet in parallel bars, 5/24: min A walking 10 feet in parallel bars;    Time 12    Period Weeks    Status Partially Met    Target Date 08/20/19      PT LONG TERM GOAL #6   Title Patient will be able to safety navigate up/down ramp with power  chair, exhibiting good safety awareness, mod I to safely enter/exit his home.    Baseline 2/11: supervision with intermittent min A;4/1: supervision; 5/24: supervision    Time 12    Period Weeks    Status Partially Met    Target  Date 08/20/19      PT LONG TERM GOAL #7   Title Patient will increase functional reach test to >15 inches in sitting to exhibit improved sitting balance and positioning and reduce fall risk;    Baseline 07/30/18: 12 inches; 09/11/18: 10-12 inches; 10/23/18: 12-13 inch; 10/12: 14.5", 10/29: 15.5 inch    Time 4    Period Weeks    Status Achieved      PT LONG TERM GOAL #8   Title Patient will increase LLE quad strength to 3+/5 to improve functional strength for standing and mobility;    Baseline 10/28: 2+/5; 01/13/19: 2+/5, 2/11: 3-/5, 04/16/19: 3-/5, 05/08/19: 3-/5, 5/24: 3-/5    Time 12    Period Weeks    Status Partially Met    Target Date 08/20/19      PT LONG TERM GOAL  #9   TITLE Patient will report no vertigo with provoking motions or positions in order to be able to perform ADLs and mobility tasks without symptoms.    Baseline 06/30/19: reports no dizziness in last week;    Time 12    Period Weeks    Status New    Target Date 08/20/19                 Plan - 07/21/19 1435    Clinical Impression Statement Patient motivated and participated well within session. He was able to exhibit better trunk control with transfers this session requiring less assistance with sit<>stand. Patient does require min-mod VCs for weight shift for optimal weight shift and stance control. Patient does fatigue quickly requiring short seated rest breaks. Challenged cardiovascular endurance having patient sit edge of seat and then stand back up quickly with less prolonged rest break for better stance control/positioning. Patient does require min A for weight shift and proper positioning with single leg march/toe taps. He would benefit from additional skilled PT intervention to improve strength, balance and mobility;    Personal Factors and Comorbidities Age;Comorbidity 3+    Comorbidities Relevant past medical history and comorbidities include long term steroid use for lupus, CKD, chronic osteomyelitis,  cervical spine stenosis, BPH, APS, alcohol abuse, peripheral artery disease, depression, GERD, obstructive sleep apnea, HTN, IBS, IgA deficiency, lumbar radiculopathy, RA, Stroke, toxic maculopathy in both eyes, systemic lupus erythematosus related syndrome, cardiac catheterization, carotid endarterectomy, cervical laminectomy, coronary angioplasty, Fusion C5-C7, knee arthroplasty, lumbar surgery.    Examination-Activity Limitations Bed Mobility;Bend;Sit;Toileting;Stand;Stairs;Lift;Transfers;Squat;Locomotion Level;Carry;Dressing;Hygiene/Grooming;Continence    Examination-Participation Restrictions Yard Work;Interpersonal Relationship;Community Activity    Stability/Clinical Decision Making Evolving/Moderate complexity    Rehab Potential Fair    PT Frequency 3x / week    PT Duration 12 weeks    PT Treatment/Interventions ADLs/Self Care Home Management;Electrical Stimulation;Moist Heat;Cryotherapy;Gait training;Stair training;Functional mobility training;Therapeutic exercise;Balance training;Neuromuscular re-education;Cognitive remediation;Patient/family education;Orthotic Fit/Training;Wheelchair mobility training;Manual techniques;Passive range of motion;Energy conservation;Joint Manipulations;Canalith Repostioning;Therapeutic activities;Vestibular    PT Next Visit Plan Work on strength, sitting/standing balance, functional activities such as rolling, bed mobility, transfers; caregiver training    PT Home Exercise Plan no updates this session    Consulted and Agree with Plan of Care Patient;Family member/caregiver    Family Member Consulted Wife           Patient will benefit from  skilled therapeutic intervention in order to improve the following deficits and impairments:  Abnormal gait, Decreased activity tolerance, Decreased cognition, Decreased endurance, Decreased knowledge of use of DME, Decreased range of motion, Decreased skin integrity, Decreased strength, Impaired perceived functional  ability, Impaired sensation, Impaired UE functional use, Improper body mechanics, Pain, Cardiopulmonary status limiting activity, Decreased balance, Decreased coordination, Decreased mobility, Difficulty walking, Impaired tone, Postural dysfunction, Dizziness  Visit Diagnosis: Muscle weakness (generalized)  Other lack of coordination  Difficulty in walking, not elsewhere classified  Repeated falls     Problem List Patient Active Problem List   Diagnosis Date Noted  . Sepsis ([place]) 10/18/2018    Jarret Torre PT, DPT 07/21/2019, 2:37 PM  [place] MAIN [facility] SERVICES [address] [place], [place], [address] Phone: [phone]   Fax:  [phone]  Name: CAYDON FEASEL MRN: [id number] Date of Birth: [birth year]

## 2019-07-21 NOTE — Therapy (Signed)
[place] MAIN [facility] SERVICES [address] [place], [place], [address] Phone: [phone]   Fax:  [phone]  Occupational Therapy Treatment/Progress Update Reporting period from 06/09/2019 to 07/21/2019  Patient Details  Name: Kenneth Summers MRN: [id number] Date of Birth: 08/09/1945 No data recorded  Encounter Date: 07/21/2019   OT End of Session - 07/23/19 0825    Visit Number 120    Number of Visits 157    Date for OT Re-Evaluation 09/15/19    OT Start Time 1345    OT Stop Time 1430    OT Time Calculation (min) 45 min    Activity Tolerance Patient tolerated treatment well;Patient limited by fatigue    Behavior During Therapy [facility] for tasks assessed/performed           Past Medical History:  Diagnosis Date  . Actinic keratosis   . Alcohol abuse   . APS (antiphospholipid syndrome) ([place])   . Basal cell carcinoma   . BPH (benign prostatic hyperplasia)   . CAD (coronary artery disease)   . Cellulitis   . Cervical spinal stenosis   . Chronic osteomyelitis ([place])   . CKD (chronic kidney disease)   . CKD (chronic kidney disease)   . Clostridium difficile diarrhea   . Depression   . Diplopia   . Fatigue   . GERD (gastroesophageal reflux disease)   . Hyperlipemia   . Hypertension   . Hypovitaminosis D   . IBS (irritable bowel syndrome)   . 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 Insomnia   . Left lumbosacral radiculopathy   . Moderate obstructive sleep apnea   . Osteomyelitis of foot ([place])   . PAD (peripheral artery disease) ([place])   . Pruritus   . RA (rheumatoid arthritis) ([place])   . Radiculopathy   . Restless legs syndrome   . RLS (restless legs syndrome)   . SLE (systemic lupus erythematosus related syndrome) ([place])   . 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 Stroke ([place])   . Toxic maculopathy of both eyes     Past Surgical History:  Procedure Laterality Date  . CARDIAC CATHETERIZATION    .  CAROTID ENDARTERECTOMY    . CERVICAL LAMINECTOMY    . CORONARY ANGIOPLASTY    . FRACTURE SURGERY    . fusion C5-6-7    . HERNIA REPAIR    . KNEE ARTHROSCOPY    . TONSILLECTOMY      There were no vitals filed for this visit.   Subjective Assessment - 07/23/19 0824    Subjective  Patient reports he is doing well today, no pain.  Laughing he states, "its a pain to try to pick up these cards!"    Pertinent History Pt. is a 74 y.o. 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 Pt. PMHx includes: Multiple Falls, Lupus, DJD, Renal Abscess, CVA. Pt. resides with his wife. Pt.'s wife and daughter assist with ADLs. Pt. has caregivers in  for 2 hours a day, 6 days a week. Pt. received Rehab services in acute care, at SNF for STR, and [place] services. Pt. is retired from [employer] for [employer] and [employer].    Patient Stated Goals To regain use of his left UE, and do more for himself.    Currently in Pain? No/denies    Pain Score 0-No pain  [facility] OT Assessment - 07/23/19 0834      Coordination   Right 9 Hole Peg Test 39 sec    Left 9 Hole Peg Test 1 min 14 sec      Hand Function   Right Hand Grip (lbs) 38    Right Hand Lateral Pinch 12 lbs    Right Hand 3 Point Pinch 8 lbs    Left Hand Grip (lbs) 30    Left Hand Lateral Pinch 7 lbs    Left 3 point pinch 8 lbs         Patient seen this date for reassessment of hand skills, refer to flowsheet for details.  Goals updated to reflect progress.  Patient pleased with his progress this date.  Patient seen for visual scanning task from w/c position with use of scatter pile of cards to put cards in ascending order according to suits, emphasis on reaching with trunk focus.  Patient working towards improved control of trunk with forward reach.  Occasional cues required to seek select cards.    Response to tx:   Patient has made excellent progress since last progress update, 9 hole peg test  with significant change from over 4 mins 21 sec down to 1 min 14 sec.  Grip strength improved to 30# on the left from 20# the last time it was measured.  Patient is progressing towards goals.  Patient able to complete visual scanning task today with good focus and no distractions, occasional cues as needed to seek out cards in outlining areas.  Patient continues to benefit from skilled OT services to maximize safety and independence in daily tasks.                      OT Education - 07/23/19 0825    Education Details left reaching tasks, left sided awareness, trunk activities    Person(s) Educated Patient    Methods Explanation;Demonstration    Comprehension Returned demonstration;Tactile cues required;Verbalized understanding;Need further instruction               OT Long Term Goals - 07/23/19 0826      OT LONG TERM GOAL #1   Title Pt. will increase left shoulder flexion AROM by 10 degrees to access cabinet/shelf.    Baseline Shoulder flexion:130. Pt. is progressing, and continues to work on reaching with increasing emphasis on flexing trunk to improve functional reach.  07/21/19- shoulder flexion to 130 with effort    Time 12    Period Weeks    Status Partially Met    Target Date 09/15/19      OT LONG TERM GOAL #2   Title Pt. will donn a shirt with Supervision.    Baseline Pt. continues to require MinA donning a zip down jacket with consistent cues to initiate the correct direction of the jacket.  07/21/19 continues to require min assist at times for shirt/jacket    Time 12    Status On-going    Target Date 09/15/19      OT LONG TERM GOAL #4   Title Pt. will improve left grip strength by 10# to be able to open a jar/container.    Baseline Pt. continues to work towards opening containers, and bottles.    Time 12    Period Weeks    Status On-going    Target Date 09/15/19      OT LONG TERM GOAL #5   Title Pt. will improve left hand [facility] skills to  be able to  assist with buttoning/zipping.    Baseline Pt. is improving with manipulating the zipper on his jacket, however continues to require practice. Pt. continues to work on improving bilateral [facility] skills for buttoning/zipping.  07/21/19 improving with buttons and zippers.    Time 12    Period Weeks    Status On-going    Target Date 09/15/19      OT LONG TERM GOAL #6   Title Pt. will demonstrate visual conmpensatory strategies for 100% of the time during ADLs    Baseline Pt. requires  fewer cues for left sides awareness.    Time 12    Period Weeks    Status Partially Met    Target Date 09/15/19      OT LONG TERM GOAL #8   Title Pt. will accurately identify potential safety hazard using good safety awareness, and judgement 100% for ADLs, and IADLs.    Baseline Pt. requires cues    Time 12    Period Weeks    Status On-going    Target Date 09/15/19      OT LONG TERM GOAL  #9   TITLE Pt. will  navigate his w/c around obstacles with Supervision and 100% accuracy    Baseline Pt. continues to require cuing for obstacle on the left, however requires less cues. 07/21/2019 continues to improve with decreased assist at times    Time 12    Period Weeks    Status On-going    Target Date 09/15/19      OT LONG TERM GOAL  #11   TITLE Pt. will increase BUE strength by 8m grades in preparation for ADL transfers.    Baseline , 07/21/19 continues to work towards improved strength, LUE 3-/5    Time 12    Period Weeks    Status On-going    Target Date 09/15/19      OT LONG TERM GOAL  #12   TITLE Pt. will improve LUE functional reaching with the left engaging the trunk 100% of the time during ADL tasks with minimal cues.    Baseline 07/21/2019: Pt. is improving with LUE functional reachin, howwver requires cues to engage trunk for extended reaching.    Time 12    Period Weeks    Status On-going    Target Date 09/15/19                 Plan - 07/23/19 0825    Clinical Impression Statement  Patient has made excellent progress since last progress update, 9 hole peg test with significant change from over 4 mins 21 sec down to 1 min 14 sec.  Grip strength improved to 30# on the left from 20# the last time it was measured.  Patient is progressing towards goals.  Patient able to complete visual scanning task today with good focus and no distractions, occasional cues as needed to seek out cards in outlining areas.  Patient continues to benefit from skilled OT services to maximize safety and independence in daily tasks.    OT Occupational Profile and History Problem Focused Assessment - Including review of records relating to presenting problem    Occupational performance deficits (Please refer to evaluation for details): ADL's    Body Structure / Function / Physical Skills UE functional use;Coordination;FMC;Dexterity;Strength;ROM    Cognitive Skills Attention;Memory;Emotional;Problem Solve;Safety Awareness    Rehab Potential Good    Clinical Decision Making Several treatment options, min-mod task modification necessary    Comorbidities Affecting Occupational  Performance: Presence of comorbidities impacting occupational performance    Comorbidities impacting occupational performance description: Phyical, cognitive, visual,  medical comorbidities    Modification or Assistance to Complete Evaluation  No modification of tasks or assist necessary to complete eval    OT Frequency 2x / week    OT Treatment/Interventions Self-care/ADL training;DME and/or AE instruction;Therapeutic exercise;Therapeutic activities;Moist Heat;Cognitive remediation/compensation;Neuromuscular education;Visual/perceptual remediation/compensation;Coping strategies training;Patient/family education;Passive range of motion;Psychosocial skills training;Energy conservation;Functional Mobility Training    Consulted and Agree with Plan of Care Patient           Patient will benefit from skilled therapeutic intervention in order  to improve the following deficits and impairments:   Body Structure / Function / Physical Skills: UE functional use, Coordination, FMC, Dexterity, Strength, ROM Cognitive Skills: Attention, Memory, Emotional, Problem Solve, Safety Awareness     Visit Diagnosis: Muscle weakness (generalized)  Other lack of coordination  Hemiplegia and hemiparesis following cerebral infarction affecting left non-dominant side [facility])    Problem List Patient Active Problem List   Diagnosis Date Noted  . Sepsis ([place]) 10/18/2018   Celestina Gironda T Rahman Ferrall, OTR/L, CLT  Donyetta Ogletree 07/24/2019, 8:38 AM  [place] MAIN [facility] SERVICES [address] [place], [place], [address] Phone: [phone]   Fax:  [phone]  Name: Kenneth Summers MRN: [id number] Date of Birth: 04/27/1945

## 2019-07-23 ENCOUNTER — Ambulatory Visit: Payer: Medicare Other | Admitting: Physical Therapy

## 2019-07-23 ENCOUNTER — Encounter: Payer: Self-pay | Admitting: Physical Therapy

## 2019-07-23 ENCOUNTER — Other Ambulatory Visit: Payer: Self-pay

## 2019-07-23 DIAGNOSIS — R262 Difficulty in walking, not elsewhere classified: Secondary | ICD-10-CM

## 2019-07-23 DIAGNOSIS — R278 Other lack of coordination: Secondary | ICD-10-CM

## 2019-07-23 DIAGNOSIS — M6281 Muscle weakness (generalized): Secondary | ICD-10-CM | POA: Diagnosis not present

## 2019-07-23 DIAGNOSIS — R296 Repeated falls: Secondary | ICD-10-CM

## 2019-07-23 NOTE — Therapy (Signed)
[place] MAIN [facility] SERVICES [address] [place], [place], [address] Phone: [phone]   Fax:  [phone]  Physical Therapy Treatment  Patient Details  Name: Kenneth Summers MRN: [id number] Date of Birth: August 16, 1945 No data recorded  Encounter Date: 07/23/2019   PT End of Session - 07/23/19 1430    Visit Number [id number]    Number of Visits 180    Date for PT Re-Evaluation 08/20/19    Authorization Type Medicare reporting period starting 09/11/18    Authorization Time Period goals last assessed 05/26/19    PT Start Time 1302    PT Stop Time 1345    PT Time Calculation (min) 43 min    Equipment Utilized During Treatment Gait belt    Activity Tolerance Patient tolerated treatment well;No increased pain    Behavior During Therapy WFL for tasks assessed/performed           Past Medical History:  Diagnosis Date  . Actinic keratosis   . Alcohol abuse   . APS (antiphospholipid syndrome) ([place])   . Basal cell carcinoma   . BPH (benign prostatic hyperplasia)   . CAD (coronary artery disease)   . Cellulitis   . Cervical spinal stenosis   . Chronic osteomyelitis ([place])   . CKD (chronic kidney disease)   . CKD (chronic kidney disease)   . Clostridium difficile diarrhea   . Depression   . Diplopia   . Fatigue   . GERD (gastroesophageal reflux disease)   . Hyperlipemia   . Hypertension   . Hypovitaminosis D   . IBS (irritable bowel syndrome)   . 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 Insomnia   . Left lumbosacral radiculopathy   . Moderate obstructive sleep apnea   . Osteomyelitis of foot ([place])   . PAD (peripheral artery disease) ([place])   . Pruritus   . RA (rheumatoid arthritis) ([place])   . Radiculopathy   . Restless legs syndrome   . RLS (restless legs syndrome)   . SLE (systemic lupus erythematosus related syndrome) ([place])   . 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 Stroke ([place])   . Toxic maculopathy of both eyes      Past Surgical History:  Procedure Laterality Date  . CARDIAC CATHETERIZATION    . CAROTID ENDARTERECTOMY    . CERVICAL LAMINECTOMY    . CORONARY ANGIOPLASTY    . FRACTURE SURGERY    . fusion C5-6-7    . HERNIA REPAIR    . KNEE ARTHROSCOPY    . TONSILLECTOMY      There were no vitals filed for this visit.   Subjective Assessment - 07/23/19 1429    Subjective Patient reports doing well today. He denies any fatigue. During session he did admit that he had not had anything to eat today, that he skipped breakfast.    Patient is accompained by: Family member    Pertinent History Patient is a 74 y.o. male who presents to outpatient physical therapy with a referral for medical diagnosis of CVA. This patient's chief complaints consist of left hemiplegia and overall deconditioning leading to the following functional deficits: dependent for ADLs, IADLs, unable to transfer to car, dependent transfers at home with hoyer lift, unable to walk or stand without significant assistance, difficulty with W/C navigation..    Limitations Sitting;Lifting;Standing;House hold activities;Writing;Walking;Other (comment)    Currently in Pain? No/denies    Pain Onset More than a month ago  Multiple Pain Sites No              TREATMENT: Prior to transfersand in between transfers: Patient sittingin wheelchair: -hip flexion marchx10reps bilaterally -LAQ x15 reps each LE with visual cues for increased ROM;   Ptrequires verbal and visual cues to improve ROM;he had a harder time with trunk control with heavy right side lean;  Following exercise: Transferred sit<>Stand in parallel bars (seat adjusted to 27 inch from floor height) x8-10reps throughout session;He was able to transferMin A to CGA required modVCs for forward weight shift for better trunk control and transfer ability;Patient does exhibit increased lateral lean to left side this session;   Standing in parallel  bars: Standing,min Awith min VCs for weight shift and increased extension for better stance control; -Mini squatx10 reps with CGA and min VCs for weight shift and to increase LE muscle activation; -weight shift side/side x5reps withCGA and min VCs for erect posture and increased LLE knee extension in stance; -attempted heel raises but patient unable;  -weight shift to neutral, with cues for hip extensionand to shift to neutral without heavy right lateral lean -RLE toe taps to 4 inch step with B Rail assist, therapist blocking LLE knee for better stance control x3 reps;  Required min A for safety with mod VCS for erect posture and increased hip extension;   Patient initially exhibits flexed posture with decreased hip extension;he also exhibits heavy lean to left side requiring instruction for weight shift to neutral position;    Patient requiredmodA to scoot back in chair at end of session due to increased fatigue  Response to treatment: Toleratedfair. Patient was heavily fatigued at end of session. He did have difficulty with trunk control this session, often falling to left side. Patient did require increased seated rest breaks due fatigue;                     PT Education - 07/23/19 1430    Education Details LE exercise, strengthening, transfers/posture, HEP    Person(s) Educated Patient;Spouse    Methods Explanation;Verbal cues    Comprehension Verbalized understanding;Returned demonstration;Verbal cues required;Need further instruction            PT Short Term Goals - 05/28/19 0717      PT SHORT TERM GOAL #1   Title Be independent with initial home exercise program for self-management of symptoms.    Baseline HEP to be given at second session; advice to practice unsupported sitting at home (06/19/2018); 07/08/18: Performing at home, 6/29: needs assistance but is doing exercise program regularly; 08/22/18: adherent;    Time 2    Period  Weeks    Status Achieved    Target Date 06/26/18             PT Long Term Goals - 06/30/19 1330      PT LONG TERM GOAL #1   Title Patient will complete all bed mobility with min A to improve functional independence for getting in and out of bed and adjusting in bed.     Baseline max A - total A reported by family (06/19/2018); 07/08/18: still requires maxA, dtr reports improvement in rolling since starting therapy; 6/23: min A to supervision in clinic; not doing at home right; 8/5: Pt's daughter states that his rolling has improved, but still has difficulty with all other bed mobility; 10/23/18: pts daughter states that she is doing about 35% of the work if he is in proper position, he does  uses the bed rails to help roll;  using Harrel Lemon for coming to sit EOB; 10/12: pts daughter states that she is doing about 30% of the work if he is in proper position, he does uses the bed rails to help roll, but it is improving;  using Hoyer for coming to sit EOB, 10/29: min A for sit to sidelying, rolling supervision, able to transition sidelying to sit with HHA;    Time 12    Period Weeks    Status Achieved      PT LONG TERM GOAL #2   Title Patient will complete sit <> stand transfer chair to chair with Min A and LRAD to improve functional independence for household and community mobility.     Baseline supervision for STS, still requires elevated surface height to 27"    Time 12    Period Weeks    Status Achieved      PT LONG TERM GOAL #3   Title Patient will navigate power w/c with min A x 100 feet to improve mobility for household and short community distances.    Baseline required total A (06/12/2018); able to roll 20 feet with min A (06/20/2018); 07/08/18: Pt can go approximately 10' without assist per family;  10/23/2018: pt's daughter states that his power WC mobility has become 100% better with the new hand control, slowed speed, and with practice; 10/12: pt's daughter reports that he can perform household  WC mobility and limited community ambulation with minA    Time 12    Period Weeks    Status Achieved      PT LONG TERM GOAL #4   Title Patient will complete car transfers W/C to family SUV with min A using LRAD to improve his abilty to participate in community activities.     Baseline unable to complete car transfers at all (06/12/2018); unable to complete car transfers at all (06/19/2018).; 07/08/18: Unable to perform at this time; 09/11/18: unable to perform at this time; 10/23/2018: unable to complete car transfers at all; 10/12:  unable to complete car transfers at all, 10/29: unable; 01/13/19: unable; 2/11: min-mod A +2, 3/10: min A+1    Time 12    Period Weeks    Status Achieved      PT LONG TERM GOAL #5   Title Patient will ambulate at least 10 feet with min A using LRAD to improve mobility for household and community distances.    Baseline unable to take steps (06/12/2018); able to shift R leg forward and back with max A and RW at edge of plinth (06/19/2018); 07/08/18: unable to ambulate at this time. 09/11/18: unable to perform at this time; 10/23/2018: unable to perform at this time; 10/12: unable to perform at this time, 10/29: unable at this time; 01/13/19: unable at this time, 2/11: min A to step with RLE, unable to step with LLE, 3/10: able to take 2 steps in parallel bars with min A for safety; 4/1: min A for walking 10 feet in parallel bars, 5/24: min A walking 10 feet in parallel bars;    Time 12    Period Weeks    Status Partially Met    Target Date 08/20/19      PT LONG TERM GOAL #6   Title Patient will be able to safety navigate up/down ramp with power chair, exhibiting good safety awareness, mod I to safely enter/exit his home.    Baseline 2/11: supervision with intermittent min A;4/1: supervision; 5/24: supervision  Time 12    Period Weeks    Status Partially Met    Target Date 08/20/19      PT LONG TERM GOAL #7   Title Patient will increase functional reach test to >15 inches in  sitting to exhibit improved sitting balance and positioning and reduce fall risk;    Baseline 07/30/18: 12 inches; 09/11/18: 10-12 inches; 10/23/18: 12-13 inch; 10/12: 14.5", 10/29: 15.5 inch    Time 4    Period Weeks    Status Achieved      PT LONG TERM GOAL #8   Title Patient will increase LLE quad strength to 3+/5 to improve functional strength for standing and mobility;    Baseline 10/28: 2+/5; 01/13/19: 2+/5, 2/11: 3-/5, 04/16/19: 3-/5, 05/08/19: 3-/5, 5/24: 3-/5    Time 12    Period Weeks    Status Partially Met    Target Date 08/20/19      PT LONG TERM GOAL  #9   TITLE Patient will report no vertigo with provoking motions or positions in order to be able to perform ADLs and mobility tasks without symptoms.    Baseline 06/30/19: reports no dizziness in last week;    Time 12    Period Weeks    Status New    Target Date 08/20/19                 Plan - 07/23/19 1430    Clinical Impression Statement Patient motivated and participated fair within session. He does report increased fatigue during session and exhibits impaired trunk control with heavy left side lean. He had a harder time with forward weight shift this session having a harder time with neutral stance. Patient required min A for most transfers and upon standing with often sit back down due to fatigue having a hard time doing standing activities. He would benefit from additional skilled PT Intervention to improve strength, balance and mobility;    Personal Factors and Comorbidities Age;Comorbidity 3+    Comorbidities Relevant past medical history and comorbidities include long term steroid use for lupus, CKD, chronic osteomyelitis, cervical spine stenosis, BPH, APS, alcohol abuse, peripheral artery disease, depression, GERD, obstructive sleep apnea, HTN, IBS, IgA deficiency, lumbar radiculopathy, RA, Stroke, toxic maculopathy in both eyes, systemic lupus erythematosus related syndrome, cardiac catheterization, carotid  endarterectomy, cervical laminectomy, coronary angioplasty, Fusion C5-C7, knee arthroplasty, lumbar surgery.    Examination-Activity Limitations Bed Mobility;Bend;Sit;Toileting;Stand;Stairs;Lift;Transfers;Squat;Locomotion Level;Carry;Dressing;Hygiene/Grooming;Continence    Examination-Participation Restrictions Yard Work;Interpersonal Relationship;Community Activity    Stability/Clinical Decision Making Evolving/Moderate complexity    Rehab Potential Fair    PT Frequency 3x / week    PT Duration 12 weeks    PT Treatment/Interventions ADLs/Self Care Home Management;Electrical Stimulation;Moist Heat;Cryotherapy;Gait training;Stair training;Functional mobility training;Therapeutic exercise;Balance training;Neuromuscular re-education;Cognitive remediation;Patient/family education;Orthotic Fit/Training;Wheelchair mobility training;Manual techniques;Passive range of motion;Energy conservation;Joint Manipulations;Canalith Repostioning;Therapeutic activities;Vestibular    PT Next Visit Plan Work on strength, sitting/standing balance, functional activities such as rolling, bed mobility, transfers; caregiver training    PT Home Exercise Plan no updates this session    Consulted and Agree with Plan of Care Patient;Family member/caregiver    Family Member Consulted Wife           Patient will benefit from skilled therapeutic intervention in order to improve the following deficits and impairments:  Abnormal gait, Decreased activity tolerance, Decreased cognition, Decreased endurance, Decreased knowledge of use of DME, Decreased range of motion, Decreased skin integrity, Decreased strength, Impaired perceived functional ability, Impaired sensation, Impaired UE functional use, Improper body  mechanics, Pain, Cardiopulmonary status limiting activity, Decreased balance, Decreased coordination, Decreased mobility, Difficulty walking, Impaired tone, Postural dysfunction, Dizziness  Visit Diagnosis: Muscle weakness  (generalized)  Other lack of coordination  Difficulty in walking, not elsewhere classified  Repeated falls     Problem List Patient Active Problem List   Diagnosis Date Noted  . Sepsis ([place]) 10/18/2018    Trotter,Margaret PT, DPT 07/23/2019, 2:53 PM  [name] [facility] SERVICES [address] [place], [place], [address] Phone: [phone]   Fax:  [phone]  Name: YORDY MATTON MRN: [id number] Date of Birth: 1946/01/08

## 2019-07-24 ENCOUNTER — Encounter: Payer: Self-pay | Admitting: Physical Therapy

## 2019-07-24 ENCOUNTER — Ambulatory Visit: Payer: Medicare Other | Admitting: Physical Therapy

## 2019-07-24 ENCOUNTER — Encounter: Payer: Self-pay | Admitting: Occupational Therapy

## 2019-07-24 ENCOUNTER — Other Ambulatory Visit: Payer: Self-pay

## 2019-07-24 ENCOUNTER — Ambulatory Visit: Payer: Medicare Other | Admitting: Occupational Therapy

## 2019-07-24 DIAGNOSIS — H547 Unspecified visual loss: Secondary | ICD-10-CM

## 2019-07-24 DIAGNOSIS — R278 Other lack of coordination: Secondary | ICD-10-CM

## 2019-07-24 DIAGNOSIS — R296 Repeated falls: Secondary | ICD-10-CM

## 2019-07-24 DIAGNOSIS — I69354 Hemiplegia and hemiparesis following cerebral infarction affecting left non-dominant side: Secondary | ICD-10-CM

## 2019-07-24 DIAGNOSIS — M6281 Muscle weakness (generalized): Secondary | ICD-10-CM | POA: Diagnosis not present

## 2019-07-24 DIAGNOSIS — R262 Difficulty in walking, not elsewhere classified: Secondary | ICD-10-CM

## 2019-07-24 NOTE — Therapy (Signed)
[place] MAIN [facility] SERVICES [address] [place], [place], [address] Phone: [phone]   Fax:  [phone]  Physical Therapy Treatment  Patient Details  Name: Kenneth Summers MRN: [id number] Date of Birth: [birth year] No data recorded  Encounter Date: 07/24/2019   PT End of Session - 07/24/19 1318    Visit Number [id number]    Number of Visits 180    Date for PT Re-Evaluation 08/20/19    Authorization Type Medicare reporting period starting 09/11/18    Authorization Time Period goals last assessed 05/26/19    PT Start Time 1302    PT Stop Time 1345    PT Time Calculation (min) 43 min    Equipment Utilized During Treatment Gait belt    Activity Tolerance Patient tolerated treatment well;No increased pain    Behavior During Therapy WFL for tasks assessed/performed           Past Medical History:  Diagnosis Date  . Actinic keratosis   . Alcohol abuse   . APS (antiphospholipid syndrome) ([place])   . Basal cell carcinoma   . BPH (benign prostatic hyperplasia)   . CAD (coronary artery disease)   . Cellulitis   . Cervical spinal stenosis   . Chronic osteomyelitis ([place])   . CKD (chronic kidney disease)   . CKD (chronic kidney disease)   . Clostridium difficile diarrhea   . Depression   . Diplopia   . Fatigue   . GERD (gastroesophageal reflux disease)   . Hyperlipemia   . Hypertension   . Hypovitaminosis D   . IBS (irritable bowel syndrome)   . 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 Insomnia   . Left lumbosacral radiculopathy   . Moderate obstructive sleep apnea   . Osteomyelitis of foot ([place])   . PAD (peripheral artery disease) ([place])   . Pruritus   . RA (rheumatoid arthritis) ([place])   . Radiculopathy   . Restless legs syndrome   . RLS (restless legs syndrome)   . SLE (systemic lupus erythematosus related syndrome) ([place])   . 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 Stroke ([place])   . Toxic maculopathy of both eyes      Past Surgical History:  Procedure Laterality Date  . CARDIAC CATHETERIZATION    . CAROTID ENDARTERECTOMY    . CERVICAL LAMINECTOMY    . CORONARY ANGIOPLASTY    . FRACTURE SURGERY    . fusion C5-6-7    . HERNIA REPAIR    . KNEE ARTHROSCOPY    . TONSILLECTOMY      There were no vitals filed for this visit.   Subjective Assessment - 07/24/19 1316    Subjective Patient reports intermittent LUE hand pain. He reports no pain currently; Reports otherwise doing well;    Patient is accompained by: Family member    Pertinent History Patient is a 74 y.o. male who presents to outpatient physical therapy with a referral for medical diagnosis of CVA. This patient's chief complaints consist of left hemiplegia and overall deconditioning leading to the following functional deficits: dependent for ADLs, IADLs, unable to transfer to car, dependent transfers at home with hoyer lift, unable to walk or stand without significant assistance, difficulty with W/C navigation..    Limitations Sitting;Lifting;Standing;House hold activities;Writing;Walking;Other (comment)    Currently in Pain? No/denies    Pain Onset More than a month ago  TREATMENT: Prior to transfersand in between transfers: Patient sittingin wheelchair: -hip flexion marchx10reps bilaterally -erect posture arms across chest x5 reps bilaterally;   Ptrequires verbal and visual cues to improve ROM;he had a harder time with trunk control with heavy right side lean;  Following exercise: Transferred sit<>Stand in parallel bars (seat adjusted to 27 inch from floor height) x9reps throughout session;He was able to transferMin A to CGA requiredmodVCs for forward weight shift for better trunk control and transfer ability;Patient does exhibit increased lateral lean to left side this session;   Standing in parallel bars: Standing,min Awith min VCs for weight shift and increased extension for better  stance control; -Mini squatx15 repswith CGA and min VCs for weight shift and to increase LE muscle activation; -weight shift side/side x10reps withCGA and min VCs for erect posture and increased LLE knee extension in stance; -heel raises x10 reps; with min A for safety and cues for erect posture;  -RLE marches x10 reps, LLE marches x4 reps; Progressed to alternate march x2 reps each LE  Required min A for safety and cues for weight shift to neutral stance for better weight bearing in BLE;    Patient initially exhibits flexed posture with decreased hip extension;he also exhibits heavy lean to left side requiring instruction for weight shift to neutral position;    Patient requiredmodA to scoot back in chair at end of session due to increased fatigue  Response to treatment: [place]. Patient was able to exhibit better weight shift this session with improved weight bearing in BLE. He does fatigue quickly but was able to do increased activities/exercise with fewer rest breaks.                       PT Education - 07/24/19 1318    Education Details LE exercise, strengthening, transfers/posture, HEP    Person(s) Educated Patient;Child(ren)    Methods Explanation;Verbal cues    Comprehension Verbalized understanding;Returned demonstration;Verbal cues required;Need further instruction            PT Short Term Goals - 05/28/19 0717      PT SHORT TERM GOAL #1   Title Be independent with initial home exercise program for self-management of symptoms.    Baseline HEP to be given at second session; advice to practice unsupported sitting at home (06/19/2018); 07/08/18: Performing at home, 6/29: needs assistance but is doing exercise program regularly; 08/22/18: adherent;    Time 2    Period Weeks    Status Achieved    Target Date 06/26/18             PT Long Term Goals - 06/30/19 1330      PT LONG TERM GOAL #1   Title Patient will complete  all bed mobility with min A to improve functional independence for getting in and out of bed and adjusting in bed.     Baseline max A - total A reported by family (06/19/2018); 07/08/18: still requires maxA, dtr reports improvement in rolling since starting therapy; 6/23: min A to supervision in clinic; not doing at home right; 8/5: Pt's daughter states that his rolling has improved, but still has difficulty with all other bed mobility; 10/23/18: pts daughter states that she is doing about 35% of the work if he is in proper position, he does uses the bed rails to help roll;  using Harrel Lemon for coming to sit EOB; 10/12: pts daughter states that she is doing about 30% of the work if he  is in proper position, he does uses the bed rails to help roll, but it is improving;  using Harrel Lemon for coming to sit EOB, 10/29: min A for sit to sidelying, rolling supervision, able to transition sidelying to sit with HHA;    Time 12    Period Weeks    Status Achieved      PT LONG TERM GOAL #2   Title Patient will complete sit <> stand transfer chair to chair with Min A and LRAD to improve functional independence for household and community mobility.     Baseline supervision for STS, still requires elevated surface height to 27"    Time 12    Period Weeks    Status Achieved      PT LONG TERM GOAL #3   Title Patient will navigate power w/c with min A x 100 feet to improve mobility for household and short community distances.    Baseline required total A (06/12/2018); able to roll 20 feet with min A (06/20/2018); 07/08/18: Pt can go approximately 10' without assist per family;  10/23/2018: pt's daughter states that his power WC mobility has become 100% better with the new hand control, slowed speed, and with practice; 10/12: pt's daughter reports that he can perform household WC mobility and limited community ambulation with minA    Time 12    Period Weeks    Status Achieved      PT LONG TERM GOAL #4   Title Patient will complete  car transfers W/C to family SUV with min A using LRAD to improve his abilty to participate in community activities.     Baseline unable to complete car transfers at all (06/12/2018); unable to complete car transfers at all (06/19/2018).; 07/08/18: Unable to perform at this time; 09/11/18: unable to perform at this time; 10/23/2018: unable to complete car transfers at all; 10/12:  unable to complete car transfers at all, 10/29: unable; 01/13/19: unable; 2/11: min-mod A +2, 3/10: min A+1    Time 12    Period Weeks    Status Achieved      PT LONG TERM GOAL #5   Title Patient will ambulate at least 10 feet with min A using LRAD to improve mobility for household and community distances.    Baseline unable to take steps (06/12/2018); able to shift R leg forward and back with max A and RW at edge of plinth (06/19/2018); 07/08/18: unable to ambulate at this time. 09/11/18: unable to perform at this time; 10/23/2018: unable to perform at this time; 10/12: unable to perform at this time, 10/29: unable at this time; 01/13/19: unable at this time, 2/11: min A to step with RLE, unable to step with LLE, 3/10: able to take 2 steps in parallel bars with min A for safety; 4/1: min A for walking 10 feet in parallel bars, 5/24: min A walking 10 feet in parallel bars;    Time 12    Period Weeks    Status Partially Met    Target Date 08/20/19      PT LONG TERM GOAL #6   Title Patient will be able to safety navigate up/down ramp with power chair, exhibiting good safety awareness, mod I to safely enter/exit his home.    Baseline 2/11: supervision with intermittent min A;4/1: supervision; 5/24: supervision    Time 12    Period Weeks    Status Partially Met    Target Date 08/20/19      PT LONG TERM GOAL #  7   Title Patient will increase functional reach test to >15 inches in sitting to exhibit improved sitting balance and positioning and reduce fall risk;    Baseline 07/30/18: 12 inches; 09/11/18: 10-12 inches; 10/23/18: 12-13 inch; 10/12:  14.5", 10/29: 15.5 inch    Time 4    Period Weeks    Status Achieved      PT LONG TERM GOAL #8   Title Patient will increase LLE quad strength to 3+/5 to improve functional strength for standing and mobility;    Baseline 10/28: 2+/5; 01/13/19: 2+/5, 2/11: 3-/5, 04/16/19: 3-/5, 05/08/19: 3-/5, 5/24: 3-/5    Time 12    Period Weeks    Status Partially Met    Target Date 08/20/19      PT LONG TERM GOAL  #9   TITLE Patient will report no vertigo with provoking motions or positions in order to be able to perform ADLs and mobility tasks without symptoms.    Baseline 06/30/19: reports no dizziness in last week;    Time 12    Period Weeks    Status New    Target Date 08/20/19                 Plan - 07/24/19 1355    Clinical Impression Statement Patient motivated and participated well within session. he was able to exhibit better trunk control and weight shift in standing this session. Patient does fatigue requiring short seated rest breaks but was able to do increased activities with less rest breaks. Patient does require min A with most standing activities and mod VCs for weight shift for neutral trunk position, particularly increased hip extension in stance for better LE weight bearing. He would benefit from additional skilled PT intervention to improve strength, balance and gait safety;    Personal Factors and Comorbidities Age;Comorbidity 3+    Comorbidities Relevant past medical history and comorbidities include long term steroid use for lupus, CKD, chronic osteomyelitis, cervical spine stenosis, BPH, APS, alcohol abuse, peripheral artery disease, depression, GERD, obstructive sleep apnea, HTN, IBS, IgA deficiency, lumbar radiculopathy, RA, Stroke, toxic maculopathy in both eyes, systemic lupus erythematosus related syndrome, cardiac catheterization, carotid endarterectomy, cervical laminectomy, coronary angioplasty, Fusion C5-C7, knee arthroplasty, lumbar surgery.    Examination-Activity  Limitations Bed Mobility;Bend;Sit;Toileting;Stand;Stairs;Lift;Transfers;Squat;Locomotion Level;Carry;Dressing;Hygiene/Grooming;Continence    Examination-Participation Restrictions Yard Work;Interpersonal Relationship;Community Activity    Stability/Clinical Decision Making Evolving/Moderate complexity    Rehab Potential Fair    PT Frequency 3x / week    PT Duration 12 weeks    PT Treatment/Interventions ADLs/Self Care Home Management;Electrical Stimulation;Moist Heat;Cryotherapy;Gait training;Stair training;Functional mobility training;Therapeutic exercise;Balance training;Neuromuscular re-education;Cognitive remediation;Patient/family education;Orthotic Fit/Training;Wheelchair mobility training;Manual techniques;Passive range of motion;Energy conservation;Joint Manipulations;Canalith Repostioning;Therapeutic activities;Vestibular    PT Next Visit Plan Work on strength, sitting/standing balance, functional activities such as rolling, bed mobility, transfers; caregiver training    PT Home Exercise Plan no updates this session    Consulted and Agree with Plan of Care Patient;Family member/caregiver    Family Member Consulted Wife           Patient will benefit from skilled therapeutic intervention in order to improve the following deficits and impairments:  Abnormal gait, Decreased activity tolerance, Decreased cognition, Decreased endurance, Decreased knowledge of use of DME, Decreased range of motion, Decreased skin integrity, Decreased strength, Impaired perceived functional ability, Impaired sensation, Impaired UE functional use, Improper body mechanics, Pain, Cardiopulmonary status limiting activity, Decreased balance, Decreased coordination, Decreased mobility, Difficulty walking, Impaired tone, Postural dysfunction, Dizziness  Visit Diagnosis: Muscle  weakness (generalized)  Other lack of coordination  Difficulty in walking, not elsewhere classified  Repeated falls     Problem  List Patient Active Problem List   Diagnosis Date Noted  . Sepsis ([place]) 10/18/2018    Lyle Niblett PT, DPT 07/24/2019, 2:25 PM  [place] MAIN [facility] SERVICES [address] [place], [place], [address] Phone: [phone]   Fax:  [phone]  Name: KASHON KRAYNAK MRN: [id number] Date of Birth: 01/06/1946

## 2019-07-24 NOTE — Therapy (Signed)
[place] MAIN [facility] SERVICES [address] [place], [place], [address] Phone: [phone]   Fax:  [phone]  Occupational Therapy Treatment  Patient Details  Name: Kenneth Summers MRN: [id number] Date of Birth: 04-06-1945 No data recorded  Encounter Date: 07/24/2019   OT End of Session - 07/24/19 1406    Visit Number 121    Number of Visits 157    Date for OT Re-Evaluation 09/15/19    OT Start Time 1346    OT Stop Time 1435    OT Time Calculation (min) 49 min    Activity Tolerance Patient tolerated treatment well    Behavior During Therapy [facility] for tasks assessed/performed           Past Medical History:  Diagnosis Date  . Actinic keratosis   . Alcohol abuse   . APS (antiphospholipid syndrome) (Kenneth Summers)   . Basal cell carcinoma   . BPH (benign prostatic hyperplasia)   . CAD (coronary artery disease)   . Cellulitis   . Cervical spinal stenosis   . Chronic osteomyelitis ([place])   . CKD (chronic kidney disease)   . CKD (chronic kidney disease)   . Clostridium difficile diarrhea   . Depression   . Diplopia   . Fatigue   . GERD (gastroesophageal reflux disease)   . Hyperlipemia   . Hypertension   . Hypovitaminosis D   . IBS (irritable bowel syndrome)   . IgA deficiency (Kenneth Summers)   . Insomnia   . Left lumbosacral radiculopathy   . Moderate obstructive sleep apnea   . Osteomyelitis of foot ([place])   . PAD (peripheral artery disease) (Kenneth Summers)   . Pruritus   . RA (rheumatoid arthritis) (Kenneth Summers)   . Radiculopathy   . Restless legs syndrome   . RLS (restless legs syndrome)   . SLE (systemic lupus erythematosus related syndrome) (Kenneth Summers)   . 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 Stroke (Kenneth Summers)   . Toxic maculopathy of both eyes     Past Surgical History:  Procedure Laterality Date  . CARDIAC CATHETERIZATION    . CAROTID ENDARTERECTOMY    . CERVICAL LAMINECTOMY    . CORONARY ANGIOPLASTY    . FRACTURE  SURGERY    . fusion C5-6-7    . HERNIA REPAIR    . KNEE ARTHROSCOPY    . TONSILLECTOMY      There were no vitals filed for this visit.   Subjective Assessment - 07/24/19 1403    Subjective  Patient reports he just had a telehealth visit with his doctor and they didn't change any of his medications.    Pertinent History Pt. is a 74 y.o. 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 Pt. PMHx includes: Multiple Falls, Lupus, DJD, Renal Abscess, CVA. Pt. resides with his wife. Pt.'s wife and daughter assist with ADLs. Pt. has caregivers in  for 2 hours a day, 6 days a week. Pt. received Rehab services in acute care, at SNF for STR, and [facility]. Pt. is retired from [employer] for [employer] and [employer].    Patient Stated Goals To regain use of his left UE, and do more for himself.    Currently in Pain? Yes    Pain Score 1     Pain Location Hand    Pain Orientation Left    Pain Descriptors / Indicators Shooting;Stabbing;Aching    Pain Type  Chronic pain    Pain Onset More than a month ago    Pain Frequency Intermittent           Pt seen this date to manipulate small pegs to place into pegboard.  Patient has some difficulty with turning the small 1/2 inch items to place into the board, occasionally using right hand to reposition.  Patient utilizing small wooden tongs to remove pegs from board, cues for positioning and squeezing.    Patient completing wooden tetris puzzle with key provided on how to position the pieces in the board.  Cues for scanning and matching up pieces, mild perceptual difficulties with pieces that require flipping over for mirror effect. Patient was able to complete puzzle within the session and reports feeling accomplished, states he hasn't been able to finish the judy board peg design since being in therapy and would like strategies or ways to complete it in one session.  Will try to work towards this next session.   Patient  working on trunk control with reaching of pieces placed further away.   Response to tx:  Patient continues to make good progress, today his neck fatigued with more looking down at the puzzle and pieces.  He appears to enjoy tasks that have an outcome and things he can work on finishing within the therapy session.  Patient required cues at times to use left hand versus right hand, recognizes at times he is using his non affected hand.  Patient demonstrates difficulty with manipulation of small 1/2 inch pegs in left hand and occasionally requires assist from right hand to turn to place into board.  Patient able to operate wooden tongs today with good success with left hand, cues for positioning in hand.  Continue to work towards goals in plan of care to improve left UE strength, ROM , coordination, visual scanning and trunk control with activities.                    OT Education - 07/24/19 1405    Education Details use of tongs, manipulation of small objects    Person(s) Educated Patient    Methods Explanation;Demonstration    Comprehension Returned demonstration;Tactile cues required;Verbalized understanding;Need further instruction               OT Long Term Goals - 07/23/19 0826      OT LONG TERM GOAL #1   Title Pt. will increase left shoulder flexion AROM by 10 degrees to access cabinet/shelf.    Baseline Shoulder flexion:130. Pt. is progressing, and continues to work on reaching with increasing emphasis on flexing trunk to improve functional reach.  07/21/19- shoulder flexion to 130 with effort    Time 12    Period Weeks    Status Partially Met    Target Date 09/15/19      OT LONG TERM GOAL #2   Title Pt. will donn a shirt with Supervision.    Baseline Pt. continues to require MinA donning a zip down jacket with consistent cues to initiate the correct direction of the jacket.  07/21/19 continues to require min assist at times for shirt/jacket    Time 12    Status  On-going    Target Date 09/15/19      OT LONG TERM GOAL #4   Title Pt. will improve left grip strength by 10# to be able to open a jar/container.    Baseline Pt. continues to work towards opening containers, and bottles.  Time 12    Period Weeks    Status On-going    Target Date 09/15/19      OT LONG TERM GOAL #5   Title Pt. will improve left hand [facility] skills to be able to assist with buttoning/zipping.    Baseline Pt. is improving with manipulating the zipper on his jacket, however continues to require practice. Pt. continues to work on improving bilateral [facility] skills for buttoning/zipping.  07/21/19 improving with buttons and zippers.    Time 12    Period Weeks    Status On-going    Target Date 09/15/19      OT LONG TERM GOAL #6   Title Pt. will demonstrate visual conmpensatory strategies for 100% of the time during ADLs    Baseline Pt. requires  fewer cues for left sides awareness.    Time 12    Period Weeks    Status Partially Met    Target Date 09/15/19      OT LONG TERM GOAL #8   Title Pt. will accurately identify potential safety hazard using good safety awareness, and judgement 100% for ADLs, and IADLs.    Baseline Pt. requires cues    Time 12    Period Weeks    Status On-going    Target Date 09/15/19      OT LONG TERM GOAL  #9   TITLE Pt. will  navigate his w/c around obstacles with Supervision and 100% accuracy    Baseline Pt. continues to require cuing for obstacle on the left, however requires less cues. 07/21/2019 continues to improve with decreased assist at times    Time 12    Period Weeks    Status On-going    Target Date 09/15/19      OT LONG TERM GOAL  #11   TITLE Pt. will increase BUE strength by 19m grades in preparation for ADL transfers.    Baseline , 07/21/19 continues to work towards improved strength, LUE 3-/5    Time 12    Period Weeks    Status On-going    Target Date 09/15/19      OT LONG TERM GOAL  #12   TITLE Pt. will improve LUE  functional reaching with the left engaging the trunk 100% of the time during ADL tasks with minimal cues.    Baseline 07/21/2019: Pt. is improving with LUE functional reachin, howwver requires cues to engage trunk for extended reaching.    Time 12    Period Weeks    Status On-going    Target Date 09/15/19                 Plan - 07/24/19 1410    Clinical Impression Statement Patient continues to make good progress, today his neck fatigued with more looking down at the puzzle and pieces.  He appears to enjoy tasks that have an outcome and things he can work on finishing within the therapy session.  Patient required cues at times to use left hand versus right hand, recognizes at times he is using his non affected hand.  Patient demonstrates difficulty with manipulation of small 1/2 inch pegs in left hand and occasionally requires assist from right hand to turn to place into board.  Patient able to operate wooden tongs today with good success with left hand, cues for positioning in hand.  Continue to work towards goals in plan of care to improve left UE strength, ROM , coordination, visual scanning and trunk control with activities.  OT Occupational Profile and History Problem Focused Assessment - Including review of records relating to presenting problem    Occupational performance deficits (Please refer to evaluation for details): ADL's    Body Structure / Function / Physical Skills UE functional use;Coordination;FMC;Dexterity;Strength;ROM    Cognitive Skills Attention;Memory;Emotional;Problem Solve;Safety Awareness    Rehab Potential Good    Clinical Decision Making Several treatment options, min-mod task modification necessary    Comorbidities Affecting Occupational Performance: Presence of comorbidities impacting occupational performance    Comorbidities impacting occupational performance description: Phyical, cognitive, visual,  medical comorbidities    Modification or Assistance to  Complete Evaluation  No modification of tasks or assist necessary to complete eval    OT Frequency 2x / week    OT Treatment/Interventions Self-care/ADL training;DME and/or AE instruction;Therapeutic exercise;Therapeutic activities;Moist Heat;Cognitive remediation/compensation;Neuromuscular education;Visual/perceptual remediation/compensation;Coping strategies training;Patient/family education;Passive range of motion;Psychosocial skills training;Energy conservation;Functional Mobility Training    Consulted and Agree with Plan of Care Patient           Patient will benefit from skilled therapeutic intervention in order to improve the following deficits and impairments:   Body Structure / Function / Physical Skills: UE functional use, Coordination, FMC, Dexterity, Strength, ROM Cognitive Skills: Attention, Memory, Emotional, Problem Solve, Safety Awareness     Visit Diagnosis: Muscle weakness (generalized)  Other lack of coordination  Hemiplegia and hemiparesis following cerebral infarction affecting left non-dominant side (HCC)  Vision impairment    Problem List Patient Active Problem List   Diagnosis Date Noted  . Sepsis ([place]) 10/18/2018   Dilana Mcphie T Brannen Koppen, OTR/L, CLT  Suheyla Mortellaro 07/24/2019, 2:42 PM  [place] MAIN [facility] SERVICES [address] [place], [place], [address] Phone: [phone]   Fax:  [phone]  Name: VASHAUN OSMON MRN: [id number] Date of Birth: [birth year]

## 2019-07-28 ENCOUNTER — Other Ambulatory Visit: Payer: Self-pay

## 2019-07-28 ENCOUNTER — Ambulatory Visit: Payer: Medicare Other | Admitting: Occupational Therapy

## 2019-07-28 ENCOUNTER — Ambulatory Visit: Payer: Medicare Other | Admitting: Physical Therapy

## 2019-07-28 ENCOUNTER — Encounter: Payer: Self-pay | Admitting: Physical Therapy

## 2019-07-28 DIAGNOSIS — R296 Repeated falls: Secondary | ICD-10-CM

## 2019-07-28 DIAGNOSIS — M6281 Muscle weakness (generalized): Secondary | ICD-10-CM | POA: Diagnosis not present

## 2019-07-28 DIAGNOSIS — R262 Difficulty in walking, not elsewhere classified: Secondary | ICD-10-CM

## 2019-07-28 DIAGNOSIS — R278 Other lack of coordination: Secondary | ICD-10-CM

## 2019-07-28 DIAGNOSIS — I69354 Hemiplegia and hemiparesis following cerebral infarction affecting left non-dominant side: Secondary | ICD-10-CM

## 2019-07-28 DIAGNOSIS — H547 Unspecified visual loss: Secondary | ICD-10-CM

## 2019-07-28 NOTE — Therapy (Signed)
[place] MAIN [facility] SERVICES [address] [place], [place], [address] Phone: [phone]   Fax:  [phone]  Physical Therapy Treatment Physical Therapy Progress Note   Dates of reporting period  06/30/19   to   07/28/19   Patient Details  Name: Kenneth Summers MRN: [id number] Date of Birth: [birth year] No data recorded  Encounter Date: 07/28/2019   PT End of Session - 07/28/19 1259    Visit Number 150    Number of Visits 180    Date for PT Re-Evaluation 08/20/19    Authorization Type Medicare reporting period starting 09/11/18    Authorization Time Period goals last assessed 05/26/19    PT Start Time 1302    PT Stop Time 1345    PT Time Calculation (min) 43 min    Equipment Utilized During Treatment Gait belt    Activity Tolerance Patient tolerated treatment well;No increased pain    Behavior During Therapy WFL for tasks assessed/performed           Past Medical History:  Diagnosis Date  . Actinic keratosis   . Alcohol abuse   . APS (antiphospholipid syndrome) ([place])   . Basal cell carcinoma   . BPH (benign prostatic hyperplasia)   . CAD (coronary artery disease)   . Cellulitis   . Cervical spinal stenosis   . Chronic osteomyelitis ([place])   . CKD (chronic kidney disease)   . CKD (chronic kidney disease)   . Clostridium difficile diarrhea   . Depression   . Diplopia   . Fatigue   . GERD (gastroesophageal reflux disease)   . Hyperlipemia   . Hypertension   . Hypovitaminosis D   . IBS (irritable bowel syndrome)   . 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 Insomnia   . Left lumbosacral radiculopathy   . Moderate obstructive sleep apnea   . Osteomyelitis of foot ([place])   . PAD (peripheral artery disease) ([place])   . Pruritus   . RA (rheumatoid arthritis) ([place])   . Radiculopathy   . Restless legs syndrome   . RLS (restless legs syndrome)   . SLE (systemic lupus erythematosus related syndrome) ([place])   . Squamous cell carcinoma of skin  01/27/2019   right mid lower ear helix  . Stroke ([place])   . Toxic maculopathy of both eyes     Past Surgical History:  Procedure Laterality Date  . CARDIAC CATHETERIZATION    . CAROTID ENDARTERECTOMY    . CERVICAL LAMINECTOMY    . CORONARY ANGIOPLASTY    . FRACTURE SURGERY    . fusion C5-6-7    . HERNIA REPAIR    . KNEE ARTHROSCOPY    . TONSILLECTOMY      There were no vitals filed for this visit.   Subjective Assessment - 07/28/19 1352    Subjective Patient reports doing well. Denies any pain. Reports having one episode of dizziness over the weekend;    Patient is accompained by: Family member    Pertinent History Patient is a 74 y.o. male who presents to outpatient physical therapy with a referral for medical diagnosis of CVA. This patient's chief complaints consist of left hemiplegia and overall deconditioning leading to the following functional deficits: dependent for ADLs, IADLs, unable to transfer to car, dependent transfers at home with hoyer lift, unable to walk or stand without significant assistance, difficulty with W/C navigation..    Limitations Sitting;Lifting;Standing;House hold activities;Writing;Walking;Other (comment)    Currently in Pain? No/denies  Pain Onset More than a month ago    Multiple Pain Sites No                TREATMENT: Prior to transfersand in between transfers: Patient sittingin wheelchair: -hip flexion marchx15reps bilaterally  Ptrequires verbal and visual cues to improve ROM;he had a harder time with trunk control with heavy right side lean;  Following exercise: Transferred sit<>Stand in parallel bars (seat adjusted to 27 inch from floor height) x9reps throughout session;He was able to transferMin A to CGAand progressing to supervision at times, requiredmodVCs for forward weight shift for better trunk control and transfer ability;Patient does exhibit increased lateral lean to left side this session;  Standing in  parallel bars: Standing,min Awith min VCs for weight shift and increased extension for better stance control; -Mini squatx10 repswith CGA and min VCs for weight shift and to increase LE muscle activation; -heel raises x15 reps; with min A for safety and cues for erect posture;  -RLE marches x5 reps, x10 reps, required min A for erect posture and weight shift to LLE for better stance control;  -Toe taps on 6 inch step x5 reps RLE, 4 reps LLE;  -unsupported standing letting go of 1 hand, then the other, then no hands 10 sec hold with min A for safety and weight shift for erect posture;  Required min A for safety and cues for weight shift to neutral stance for better weight bearing in BLE;    Patient initially exhibits flexed posture with decreased hip extension;he also exhibits heavy lean toleftside requiring instruction for weight shift to neutral position;    Patient requiredmodA to scoot back in chair at end of session due to increased fatigue  Response to treatment: [place]. Patient was able to exhibit better weight shift this session with improved weight bearing in BLE. He does fatigue quickly but was able to do increased activities/exercise with fewer rest breaks.   [name] condition has the potential to improve in response to therapy. Maximum improvement is yet to be obtained. The anticipated improvement is attainable and reasonable in a generally predictable time.  Patient reports working on stretches and exercise at home. He is unable to transfer at home                 PT Education - 07/28/19 1259    Education Details LE exercise, strengthening, transfers, trunk control, HEP    Person(s) Educated Patient;Child(ren)    Methods Explanation;Demonstration;Verbal cues    Comprehension Verbalized understanding;Returned demonstration;Verbal cues required;Need further instruction            PT Short Term Goals - 05/28/19 0717      PT  SHORT TERM GOAL #1   Title Be independent with initial home exercise program for self-management of symptoms.    Baseline HEP to be given at second session; advice to practice unsupported sitting at home (06/19/2018); 07/08/18: Performing at home, 6/29: needs assistance but is doing exercise program regularly; 08/22/18: adherent;    Time 2    Period Weeks    Status Achieved    Target Date 06/26/18             PT Long Term Goals - 07/28/19 1301      PT LONG TERM GOAL #1   Title Patient will complete all bed mobility with min A to improve functional independence for getting in and out of bed and adjusting in bed.     Baseline max A - total A reported by  family (06/19/2018); 07/08/18: still requires maxA, dtr reports improvement in rolling since starting therapy; 6/23: min A to supervision in clinic; not doing at home right; 8/5: Pt's daughter states that his rolling has improved, but still has difficulty with all other bed mobility; 10/23/18: pts daughter states that she is doing about 35% of the work if he is in proper position, he does uses the bed rails to help roll;  using Hoyer for coming to sit EOB; 10/12: pts daughter states that she is doing about 30% of the work if he is in proper position, he does uses the bed rails to help roll, but it is improving;  using Hoyer for coming to sit EOB, 10/29: min A for sit to sidelying, rolling supervision, able to transition sidelying to sit with HHA;    Time 12    Period Weeks    Status Achieved      PT LONG TERM GOAL #2   Title Patient will complete sit <> stand transfer chair to chair with Min A and LRAD to improve functional independence for household and community mobility.     Baseline supervision for STS, still requires elevated surface height to 27"    Time 12    Period Weeks    Status Achieved      PT LONG TERM GOAL #3   Title Patient will navigate power w/c with min A x 100 feet to improve mobility for household and short community distances.     Baseline required total A (06/12/2018); able to roll 20 feet with min A (06/20/2018); 07/08/18: Pt can go approximately 10' without assist per family;  10/23/2018: pt's daughter states that his power WC mobility has become 100% better with the new hand control, slowed speed, and with practice; 10/12: pt's daughter reports that he can perform household WC mobility and limited community ambulation with minA    Time 12    Period Weeks    Status Achieved      PT LONG TERM GOAL #4   Title Patient will complete car transfers W/C to family SUV with min A using LRAD to improve his abilty to participate in community activities.     Baseline unable to complete car transfers at all (06/12/2018); unable to complete car transfers at all (06/19/2018).; 07/08/18: Unable to perform at this time; 09/11/18: unable to perform at this time; 10/23/2018: unable to complete car transfers at all; 10/12:  unable to complete car transfers at all, 10/29: unable; 01/13/19: unable; 2/11: min-mod A +2, 3/10: min A+1    Time 12    Period Weeks    Status Achieved      PT LONG TERM GOAL #5   Title Patient will ambulate at least 10 feet with min A using LRAD to improve mobility for household and community distances.    Baseline unable to take steps (06/12/2018); able to shift R leg forward and back with max A and RW at edge of plinth (06/19/2018); 07/08/18: unable to ambulate at this time. 09/11/18: unable to perform at this time; 10/23/2018: unable to perform at this time; 10/12: unable to perform at this time, 10/29: unable at this time; 01/13/19: unable at this time, 2/11: min A to step with RLE, unable to step with LLE, 3/10: able to take 2 steps in parallel bars with min A for safety; 4/1: min A for walking 10 feet in parallel bars, 5/24: min A walking 10 feet in parallel bars; 6/21: no change    Time  12    Period Weeks    Status Partially Met    Target Date 08/20/19      PT LONG TERM GOAL #6   Title Patient will be able to safety navigate  up/down ramp with power chair, exhibiting good safety awareness, mod I to safely enter/exit his home.    Baseline 2/11: supervision with intermittent min A;4/1: supervision; 5/24: supervision    Time 12    Period Weeks    Status Partially Met    Target Date 08/20/19      PT LONG TERM GOAL #7   Title Patient will increase functional reach test to >15 inches in sitting to exhibit improved sitting balance and positioning and reduce fall risk;    Baseline 07/30/18: 12 inches; 09/11/18: 10-12 inches; 10/23/18: 12-13 inch; 10/12: 14.5", 10/29: 15.5 inch    Time 4    Period Weeks    Status Achieved    Target Date 08/20/19      PT LONG TERM GOAL #8   Title Patient will increase LLE quad strength to 3+/5 to improve functional strength for standing and mobility;    Baseline 10/28: 2+/5; 01/13/19: 2+/5, 2/11: 3-/5, 04/16/19: 3-/5, 05/08/19: 3-/5, 5/24: 3-/5, 6/21: 3-/5    Time 12    Period Weeks    Status Partially Met    Target Date 08/20/19      PT LONG TERM GOAL  #9   TITLE Patient will report no vertigo with provoking motions or positions in order to be able to perform ADLs and mobility tasks without symptoms.    Baseline 06/30/19: reports no dizziness in last week; 6/21: one episode this weekend;    Time 12    Period Weeks    Status Partially Met    Target Date 08/20/19                 Plan - 07/28/19 1403    Clinical Impression Statement Patient motivated and participated well within session. He was able to exhibit better weight shift and trunk control progressing to transfers with supervision at times. He does continue to exhibit increased left lateral lean requiring cues for weight shift to netural. Patient was able to exhibit better AROM this session with LE exercise. He would benefit from additional skilled PT intervention to improve strength, balance and gait safety;    Personal Factors and Comorbidities Age;Comorbidity 3+    Comorbidities Relevant past medical history and  comorbidities include long term steroid use for lupus, CKD, chronic osteomyelitis, cervical spine stenosis, BPH, APS, alcohol abuse, peripheral artery disease, depression, GERD, obstructive sleep apnea, HTN, IBS, IgA deficiency, lumbar radiculopathy, RA, Stroke, toxic maculopathy in both eyes, systemic lupus erythematosus related syndrome, cardiac catheterization, carotid endarterectomy, cervical laminectomy, coronary angioplasty, Fusion C5-C7, knee arthroplasty, lumbar surgery.    Examination-Activity Limitations Bed Mobility;Bend;Sit;Toileting;Stand;Stairs;Lift;Transfers;Squat;Locomotion Level;Carry;Dressing;Hygiene/Grooming;Continence    Examination-Participation Restrictions Yard Work;Interpersonal Relationship;Community Activity    Stability/Clinical Decision Making Evolving/Moderate complexity    Rehab Potential Fair    PT Frequency 3x / week    PT Duration 12 weeks    PT Treatment/Interventions ADLs/Self Care Home Management;Electrical Stimulation;Moist Heat;Cryotherapy;Gait training;Stair training;Functional mobility training;Therapeutic exercise;Balance training;Neuromuscular re-education;Cognitive remediation;Patient/family education;Orthotic Fit/Training;Wheelchair mobility training;Manual techniques;Passive range of motion;Energy conservation;Joint Manipulations;Canalith Repostioning;Therapeutic activities;Vestibular    PT Next Visit Plan Work on strength, sitting/standing balance, functional activities such as rolling, bed mobility, transfers; caregiver training    PT Home Exercise Plan no updates this session    Consulted and Agree with Plan of Care Patient;Family  member/caregiver    Family Member Consulted Wife           Patient will benefit from skilled therapeutic intervention in order to improve the following deficits and impairments:  Abnormal gait, Decreased activity tolerance, Decreased cognition, Decreased endurance, Decreased knowledge of use of DME, Decreased range of motion,  Decreased skin integrity, Decreased strength, Impaired perceived functional ability, Impaired sensation, Impaired UE functional use, Improper body mechanics, Pain, Cardiopulmonary status limiting activity, Decreased balance, Decreased coordination, Decreased mobility, Difficulty walking, Impaired tone, Postural dysfunction, Dizziness  Visit Diagnosis: Muscle weakness (generalized)  Other lack of coordination  Difficulty in walking, not elsewhere classified  Repeated falls     Problem List Patient Active Problem List   Diagnosis Date Noted  . Sepsis ([place]) 10/18/2018    Zebulon Gantt  PT, DPT 07/28/2019, 2:04 PM  [place] MAIN [facility] SERVICES [address] [place], [place], [address] Phone: [phone]   Fax:  [phone]  Name: JOSEALFREDO ADKINS MRN: [id number] Date of Birth: 09-06-1945

## 2019-07-30 ENCOUNTER — Other Ambulatory Visit: Payer: Self-pay

## 2019-07-30 ENCOUNTER — Encounter: Payer: Medicare Other | Admitting: Occupational Therapy

## 2019-07-30 ENCOUNTER — Ambulatory Visit: Payer: Medicare Other | Admitting: Physical Therapy

## 2019-07-30 ENCOUNTER — Encounter: Payer: Self-pay | Admitting: Physical Therapy

## 2019-07-30 ENCOUNTER — Encounter: Payer: Self-pay | Admitting: Occupational Therapy

## 2019-07-30 DIAGNOSIS — R296 Repeated falls: Secondary | ICD-10-CM

## 2019-07-30 DIAGNOSIS — R278 Other lack of coordination: Secondary | ICD-10-CM

## 2019-07-30 DIAGNOSIS — M6281 Muscle weakness (generalized): Secondary | ICD-10-CM | POA: Diagnosis not present

## 2019-07-30 DIAGNOSIS — R262 Difficulty in walking, not elsewhere classified: Secondary | ICD-10-CM

## 2019-07-30 NOTE — Therapy (Signed)
[place] MAIN [facility] SERVICES [address] [place], [place], [address] Phone: [phone]   Fax:  [phone]  Physical Therapy Treatment  Patient Details  Name: Kenneth Summers MRN: [id number] Date of Birth: 18-Feb-1945 No data recorded  Encounter Date: 07/30/2019   PT End of Session - 07/30/19 1319    Visit Number 151    Number of Visits 180    Date for PT Re-Evaluation 08/20/19    Authorization Type Medicare reporting period starting 09/11/18    Authorization Time Period goals last assessed 05/26/19    PT Start Time 1302    PT Stop Time 1345    PT Time Calculation (min) 43 min    Equipment Utilized During Treatment Gait belt    Activity Tolerance Patient tolerated treatment well;No increased pain    Behavior During Therapy WFL for tasks assessed/performed           Past Medical History:  Diagnosis Date  . Actinic keratosis   . Alcohol abuse   . APS (antiphospholipid syndrome) ([place])   . Basal cell carcinoma   . BPH (benign prostatic hyperplasia)   . CAD (coronary artery disease)   . Cellulitis   . Cervical spinal stenosis   . Chronic osteomyelitis ([place])   . CKD (chronic kidney disease)   . CKD (chronic kidney disease)   . Clostridium difficile diarrhea   . Depression   . Diplopia   . Fatigue   . GERD (gastroesophageal reflux disease)   . Hyperlipemia   . Hypertension   . Hypovitaminosis D   . IBS (irritable bowel syndrome)   . 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 Insomnia   . Left lumbosacral radiculopathy   . Moderate obstructive sleep apnea   . Osteomyelitis of foot ([place])   . PAD (peripheral artery disease) ([place])   . Pruritus   . RA (rheumatoid arthritis) ([place])   . Radiculopathy   . Restless legs syndrome   . RLS (restless legs syndrome)   . SLE (systemic lupus erythematosus related syndrome) ([place])   . 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 Stroke ([place])   . Toxic maculopathy of both eyes      Past Surgical History:  Procedure Laterality Date  . CARDIAC CATHETERIZATION    . CAROTID ENDARTERECTOMY    . CERVICAL LAMINECTOMY    . CORONARY ANGIOPLASTY    . FRACTURE SURGERY    . fusion C5-6-7    . HERNIA REPAIR    . KNEE ARTHROSCOPY    . TONSILLECTOMY      There were no vitals filed for this visit.   Subjective Assessment - 07/30/19 1318    Subjective Patient reports doing well; Denies any pain; Presents to therapy with elbow pads and reports that has helped with reducing skin tears;    Patient is accompained by: Family member    Pertinent History Patient is a 74 y.o. male who presents to outpatient physical therapy with a referral for medical diagnosis of CVA. This patient's chief complaints consist of left hemiplegia and overall deconditioning leading to the following functional deficits: dependent for ADLs, IADLs, unable to transfer to car, dependent transfers at home with hoyer lift, unable to walk or stand without significant assistance, difficulty with W/C navigation..    Limitations Sitting;Lifting;Standing;House hold activities;Writing;Walking;Other (comment)    Currently in Pain? No/denies    Pain Onset More than a month ago    Multiple Pain  Sites No                  TREATMENT: Prior to transfersand in between transfers: Patient sittingin wheelchair: -hip flexion marchx15reps bilaterally -unsupported sitting (arms across chest) trunk rotation side/side x3 reps with cues for forward weight shift to avoid falling backwards -deep breathing x5 reps;  -LAQ x15 reps bilaterally  Ptrequires verbal and visual cues to improve ROM; He was able to exhibit improved LLE AROM with cues; Patient also requires cues for erect posture for better trunk control;   Following exercise: Transferred sit<>Stand in parallel bars (seat adjusted to 27 inch from floor height) x10reps throughout session;He was able to transferCGA to supervision, he requiredmodVCs  for forward weight shift for better trunk control and transfer ability;Patient does exhibit increased lateral lean to left side this session;  Standing in parallel bars: Standing,min Awith min VCs for weight shift and increased extension for better stance control; -lateral weight shift side/side x10 reps with min VCs for erect posture and increased hip/knee extension;  -Mini squat 2x10repswith CGA and min VCs for weight shift and to increase LE muscle activation; -heel raises x10 reps; with min A for safety and cues for erect posture;  -Toe taps on 6 inch step x10 reps RLE, 2x5 reps LLE;  -unsupported standing letting go of 1 hand, then the other, then no hands 10 sec hold with CGA for safety and weight shift for erect posture;  Required min A for safety and cues for weight shift to neutral stance for better weight bearing in BLE;   Patient initially exhibits flexed posture with decreased hip extension;he also exhibits heavy lean toleftside requiring instruction for weight shift to neutral position;    Patient requiredmodA to scoot back in chair at end of session due to increased fatigue  Response to treatment: [place]. Patient was able to exhibit better weight shift this session with improved weight bearing in BLE. He does fatigue quickly but was able to do increased activities/exercise with fewer rest breaks.                      PT Education - 07/30/19 1319    Education Details LE exercise, strengthening, transfers, HEP    Person(s) Educated Patient;Spouse    Methods Explanation;Verbal cues    Comprehension Verbalized understanding;Returned demonstration;Verbal cues required;Need further instruction            PT Short Term Goals - 05/28/19 0717      PT SHORT TERM GOAL #1   Title Be independent with initial home exercise program for self-management of symptoms.    Baseline HEP to be given at second session; advice to  practice unsupported sitting at home (06/19/2018); 07/08/18: Performing at home, 6/29: needs assistance but is doing exercise program regularly; 08/22/18: adherent;    Time 2    Period Weeks    Status Achieved    Target Date 06/26/18             PT Long Term Goals - 07/28/19 1301      PT LONG TERM GOAL #1   Title Patient will complete all bed mobility with min A to improve functional independence for getting in and out of bed and adjusting in bed.     Baseline max A - total A reported by family (06/19/2018); 07/08/18: still requires maxA, dtr reports improvement in rolling since starting therapy; 6/23: min A to supervision in clinic; not doing at home right; 8/5: Pt's daughter  states that his rolling has improved, but still has difficulty with all other bed mobility; 10/23/18: pts daughter states that she is doing about 35% of the work if he is in proper position, he does uses the bed rails to help roll;  using Harrel Lemon for coming to sit EOB; 10/12: pts daughter states that she is doing about 30% of the work if he is in proper position, he does uses the bed rails to help roll, but it is improving;  using Hoyer for coming to sit EOB, 10/29: min A for sit to sidelying, rolling supervision, able to transition sidelying to sit with HHA;    Time 12    Period Weeks    Status Achieved      PT LONG TERM GOAL #2   Title Patient will complete sit <> stand transfer chair to chair with Min A and LRAD to improve functional independence for household and community mobility.     Baseline supervision for STS, still requires elevated surface height to 27"    Time 12    Period Weeks    Status Achieved      PT LONG TERM GOAL #3   Title Patient will navigate power w/c with min A x 100 feet to improve mobility for household and short community distances.    Baseline required total A (06/12/2018); able to roll 20 feet with min A (06/20/2018); 07/08/18: Pt can go approximately 10' without assist per family;  10/23/2018: pt's  daughter states that his power WC mobility has become 100% better with the new hand control, slowed speed, and with practice; 10/12: pt's daughter reports that he can perform household WC mobility and limited community ambulation with minA    Time 12    Period Weeks    Status Achieved      PT LONG TERM GOAL #4   Title Patient will complete car transfers W/C to family SUV with min A using LRAD to improve his abilty to participate in community activities.     Baseline unable to complete car transfers at all (06/12/2018); unable to complete car transfers at all (06/19/2018).; 07/08/18: Unable to perform at this time; 09/11/18: unable to perform at this time; 10/23/2018: unable to complete car transfers at all; 10/12:  unable to complete car transfers at all, 10/29: unable; 01/13/19: unable; 2/11: min-mod A +2, 3/10: min A+1    Time 12    Period Weeks    Status Achieved      PT LONG TERM GOAL #5   Title Patient will ambulate at least 10 feet with min A using LRAD to improve mobility for household and community distances.    Baseline unable to take steps (06/12/2018); able to shift R leg forward and back with max A and RW at edge of plinth (06/19/2018); 07/08/18: unable to ambulate at this time. 09/11/18: unable to perform at this time; 10/23/2018: unable to perform at this time; 10/12: unable to perform at this time, 10/29: unable at this time; 01/13/19: unable at this time, 2/11: min A to step with RLE, unable to step with LLE, 3/10: able to take 2 steps in parallel bars with min A for safety; 4/1: min A for walking 10 feet in parallel bars, 5/24: min A walking 10 feet in parallel bars; 6/21: no change    Time 12    Period Weeks    Status Partially Met    Target Date 08/20/19      PT LONG TERM GOAL #6  Title Patient will be able to safety navigate up/down ramp with power chair, exhibiting good safety awareness, mod I to safely enter/exit his home.    Baseline 2/11: supervision with intermittent min A;4/1:  supervision; 5/24: supervision    Time 12    Period Weeks    Status Partially Met    Target Date 08/20/19      PT LONG TERM GOAL #7   Title Patient will increase functional reach test to >15 inches in sitting to exhibit improved sitting balance and positioning and reduce fall risk;    Baseline 07/30/18: 12 inches; 09/11/18: 10-12 inches; 10/23/18: 12-13 inch; 10/12: 14.5", 10/29: 15.5 inch    Time 4    Period Weeks    Status Achieved    Target Date 08/20/19      PT LONG TERM GOAL #8   Title Patient will increase LLE quad strength to 3+/5 to improve functional strength for standing and mobility;    Baseline 10/28: 2+/5; 01/13/19: 2+/5, 2/11: 3-/5, 04/16/19: 3-/5, 05/08/19: 3-/5, 5/24: 3-/5, 6/21: 3-/5    Time 12    Period Weeks    Status Partially Met    Target Date 08/20/19      PT LONG TERM GOAL  #9   TITLE Patient will report no vertigo with provoking motions or positions in order to be able to perform ADLs and mobility tasks without symptoms.    Baseline 06/30/19: reports no dizziness in last week; 6/21: one episode this weekend;    Time 12    Period Weeks    Status Partially Met    Target Date 08/20/19                 Plan - 07/30/19 1436    Clinical Impression Statement Patient is progressing well. He is able to exhibit better trunk control this session and improved with sit<>stand transfers. He continues to require elevated surface of 27 inch but was able to transfer with CGA to close supervision. Patient was also instructed in advanced LE strengthening exercise. He does require min Vcs for proper exercise technique. He is progressing with improved ROM on LLE but does require visual and verbal cues for correct exercise technique. Patient would benefit from additional skilled PT Intervention to improve strength, balance and mobility;    Personal Factors and Comorbidities Age;Comorbidity 3+    Comorbidities Relevant past medical history and comorbidities include long term steroid  use for lupus, CKD, chronic osteomyelitis, cervical spine stenosis, BPH, APS, alcohol abuse, peripheral artery disease, depression, GERD, obstructive sleep apnea, HTN, IBS, IgA deficiency, lumbar radiculopathy, RA, Stroke, toxic maculopathy in both eyes, systemic lupus erythematosus related syndrome, cardiac catheterization, carotid endarterectomy, cervical laminectomy, coronary angioplasty, Fusion C5-C7, knee arthroplasty, lumbar surgery.    Examination-Activity Limitations Bed Mobility;Bend;Sit;Toileting;Stand;Stairs;Lift;Transfers;Squat;Locomotion Level;Carry;Dressing;Hygiene/Grooming;Continence    Examination-Participation Restrictions Yard Work;Interpersonal Relationship;Community Activity    Stability/Clinical Decision Making Evolving/Moderate complexity    Rehab Potential Fair    PT Frequency 3x / week    PT Duration 12 weeks    PT Treatment/Interventions ADLs/Self Care Home Management;Electrical Stimulation;Moist Heat;Cryotherapy;Gait training;Stair training;Functional mobility training;Therapeutic exercise;Balance training;Neuromuscular re-education;Cognitive remediation;Patient/family education;Orthotic Fit/Training;Wheelchair mobility training;Manual techniques;Passive range of motion;Energy conservation;Joint Manipulations;Canalith Repostioning;Therapeutic activities;Vestibular    PT Next Visit Plan Work on strength, sitting/standing balance, functional activities such as rolling, bed mobility, transfers; caregiver training    PT Home Exercise Plan no updates this session    Consulted and Agree with Plan of Care Patient;Family member/caregiver    Family Member Consulted Wife  Patient will benefit from skilled therapeutic intervention in order to improve the following deficits and impairments:  Abnormal gait, Decreased activity tolerance, Decreased cognition, Decreased endurance, Decreased knowledge of use of DME, Decreased range of motion, Decreased skin integrity, Decreased  strength, Impaired perceived functional ability, Impaired sensation, Impaired UE functional use, Improper body mechanics, Pain, Cardiopulmonary status limiting activity, Decreased balance, Decreased coordination, Decreased mobility, Difficulty walking, Impaired tone, Postural dysfunction, Dizziness  Visit Diagnosis: Muscle weakness (generalized)  Other lack of coordination  Difficulty in walking, not elsewhere classified  Repeated falls     Problem List Patient Active Problem List   Diagnosis Date Noted  . Sepsis ([place]) 10/18/2018    Emiya Loomer PT, DPT 07/30/2019, 2:41 PM  [place] MAIN [facility] SERVICES [address] [place], [place], [address] Phone: [phone]   Fax:  [phone]  Name: JOSEFF LUCKMAN MRN: [id number] Date of Birth: [birth year]

## 2019-07-30 NOTE — Therapy (Signed)
[place] MAIN [facility] SERVICES [address] [place], [place], [address] Phone: [phone]   Fax:  [phone]  Occupational Therapy Treatment  Patient Details  Name: Kenneth Summers MRN: [id number] Date of Birth: 22-Sep-1945 No data recorded  Encounter Date: 07/28/2019   OT End of Session - 07/30/19 1132    Visit Number 122    Number of Visits 157    Date for OT Re-Evaluation 09/15/19    Authorization Type Progress report period starting 07/21/2019    OT Start Time 1345    OT Stop Time 1430    OT Time Calculation (min) 45 min    Activity Tolerance Patient tolerated treatment well    Behavior During Therapy [facility] for tasks assessed/performed           Past Medical History:  Diagnosis Date  . Actinic keratosis   . Alcohol abuse   . APS (antiphospholipid syndrome) ([place])   . Basal cell carcinoma   . BPH (benign prostatic hyperplasia)   . CAD (coronary artery disease)   . Cellulitis   . Cervical spinal stenosis   . Chronic osteomyelitis ([place])   . CKD (chronic kidney disease)   . CKD (chronic kidney disease)   . Clostridium difficile diarrhea   . Depression   . Diplopia   . Fatigue   . GERD (gastroesophageal reflux disease)   . Hyperlipemia   . Hypertension   . Hypovitaminosis D   . IBS (irritable bowel syndrome)   . 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 Insomnia   . Left lumbosacral radiculopathy   . Moderate obstructive sleep apnea   . Osteomyelitis of foot ([place])   . PAD (peripheral artery disease) ([place])   . Pruritus   . RA (rheumatoid arthritis) ([place])   . Radiculopathy   . Restless legs syndrome   . RLS (restless legs syndrome)   . SLE (systemic lupus erythematosus related syndrome) ([place])   . 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 Stroke ([place])   . Toxic maculopathy of both eyes     Past Surgical History:  Procedure Laterality Date  . CARDIAC CATHETERIZATION    . CAROTID ENDARTERECTOMY    .  CERVICAL LAMINECTOMY    . CORONARY ANGIOPLASTY    . FRACTURE SURGERY    . fusion C5-6-7    . HERNIA REPAIR    . KNEE ARTHROSCOPY    . TONSILLECTOMY      There were no vitals filed for this visit.   Subjective Assessment - 07/30/19 1130    Subjective  Patient reports he would like to work on the large [employer], "my goal is to finish it in one session, I have never been able to do that before."    Pertinent History Pt. is a 74 y.o. 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 Pt. PMHx includes: Multiple Falls, Lupus, DJD, Renal Abscess, CVA. Pt. resides with his wife. Pt.'s wife and daughter assist with ADLs. Pt. has caregivers in  for 2 hours a day, 6 days a week. Pt. received Rehab services in acute care, at SNF for STR, and [place] services. Pt. is retired from [employer] for [employer] and [employer].    Patient Stated Goals To regain use of his left UE, and do more for himself.    Currently in Pain? No/denies    Pain Score 0-No pain  Patient seen this date for focus on task incorporating vision, coordination, speed/dexterity, reaching, and trunk control.  Patient with a personal goal to complete a specific design copy task within one treatment session.   Patient instructed on strategies to identify patterns designs and ways to be more efficient and improve speed and completion with task.   Patient engaged in [employer] task with most complex design and able to complete within session with min cues.  Design placed on back wall to promote upright head positioning since patient has some neck fatigue when looking down for extended periods of time.   Patient then able to go through the rest of the designs and identify and point out specific patterns without difficulty.   Patient was pleased with his progress, required some repositioning in his wheelchair today, had an extra pillow underneath him and felt like he was sliding forwards.     Response to tx: Patient with excellent progress with the ability to identify patterns in designs which improved his accuracy, speed and completion of design copy tasks.  Patient met his personal goal of being able to complete the task within one treatment session.  Patient was very focused this date, not easily distracted and very directed towards his goal.  He tends to enjoy tasks he is able to complete and see the results.  Continue to work towards goals in plan of care to improve independence and safety in necessary daily tasks.                      OT Education - 07/30/19 1131    Education Details pattern designs, identifying patterns and strategies for completion of design copy    Person(s) Educated Patient    Methods Explanation;Demonstration    Comprehension Returned demonstration;Tactile cues required;Verbalized understanding;Need further instruction               OT Long Term Goals - 07/23/19 0826      OT LONG TERM GOAL #1   Title Pt. will increase left shoulder flexion AROM by 10 degrees to access cabinet/shelf.    Baseline Shoulder flexion:130. Pt. is progressing, and continues to work on reaching with increasing emphasis on flexing trunk to improve functional reach.  07/21/19- shoulder flexion to 130 with effort    Time 12    Period Weeks    Status Partially Met    Target Date 09/15/19      OT LONG TERM GOAL #2   Title Pt. will donn a shirt with Supervision.    Baseline Pt. continues to require MinA donning a zip down jacket with consistent cues to initiate the correct direction of the jacket.  07/21/19 continues to require min assist at times for shirt/jacket    Time 12    Status On-going    Target Date 09/15/19      OT LONG TERM GOAL #4   Title Pt. will improve left grip strength by 10# to be able to open a jar/container.    Baseline Pt. continues to work towards opening containers, and bottles.    Time 12    Period Weeks    Status On-going     Target Date 09/15/19      OT LONG TERM GOAL #5   Title Pt. will improve left hand [facility] skills to be able to assist with buttoning/zipping.    Baseline Pt. is improving with manipulating the zipper on his jacket, however continues to require practice. Pt. continues to work  on improving bilateral [facility] skills for buttoning/zipping.  07/21/19 improving with buttons and zippers.    Time 12    Period Weeks    Status On-going    Target Date 09/15/19      OT LONG TERM GOAL #6   Title Pt. will demonstrate visual conmpensatory strategies for 100% of the time during ADLs    Baseline Pt. requires  fewer cues for left sides awareness.    Time 12    Period Weeks    Status Partially Met    Target Date 09/15/19      OT LONG TERM GOAL #8   Title Pt. will accurately identify potential safety hazard using good safety awareness, and judgement 100% for ADLs, and IADLs.    Baseline Pt. requires cues    Time 12    Period Weeks    Status On-going    Target Date 09/15/19      OT LONG TERM GOAL  #9   TITLE Pt. will  navigate his w/c around obstacles with Supervision and 100% accuracy    Baseline Pt. continues to require cuing for obstacle on the left, however requires less cues. 07/21/2019 continues to improve with decreased assist at times    Time 12    Period Weeks    Status On-going    Target Date 09/15/19      OT LONG TERM GOAL  #11   TITLE Pt. will increase BUE strength by 53m grades in preparation for ADL transfers.    Baseline , 07/21/19 continues to work towards improved strength, LUE 3-/5    Time 12    Period Weeks    Status On-going    Target Date 09/15/19      OT LONG TERM GOAL  #12   TITLE Pt. will improve LUE functional reaching with the left engaging the trunk 100% of the time during ADL tasks with minimal cues.    Baseline 07/21/2019: Pt. is improving with LUE functional reachin, howwver requires cues to engage trunk for extended reaching.    Time 12    Period Weeks    Status On-going     Target Date 09/15/19                 Plan - 07/29/19 1134    Clinical Impression Statement Patient with excellent progress with the ability to identify patterns in designs which improved his accuracy, speed and completion of design copy tasks.  Patient met his personal goal of being able to complete the task within one treatment session.  Patient was very focused this date, not easily distracted and very directed towards his goal.  He tends to enjoy tasks he is able to complete and see the results.  Continue to work towards goals in plan of care to improve independence and safety in necessary daily tasks.    OT Occupational Profile and History Problem Focused Assessment - Including review of records relating to presenting problem    Occupational performance deficits (Please refer to evaluation for details): ADL's    Body Structure / Function / Physical Skills UE functional use;Coordination;FMC;Dexterity;Strength;ROM    Cognitive Skills Attention;Memory;Emotional;Problem Solve;Safety Awareness    Rehab Potential Good    Clinical Decision Making Several treatment options, min-mod task modification necessary    Comorbidities Affecting Occupational Performance: Presence of comorbidities impacting occupational performance    Comorbidities impacting occupational performance description: Phyical, cognitive, visual,  medical comorbidities    Modification or Assistance to Complete Evaluation  No modification of  tasks or assist necessary to complete eval    OT Frequency 2x / week    OT Treatment/Interventions Self-care/ADL training;DME and/or AE instruction;Therapeutic exercise;Therapeutic activities;Moist Heat;Cognitive remediation/compensation;Neuromuscular education;Visual/perceptual remediation/compensation;Coping strategies training;Patient/family education;Passive range of motion;Psychosocial skills training;Energy conservation;Functional Mobility Training    Consulted and Agree with Plan of  Care Patient           Patient will benefit from skilled therapeutic intervention in order to improve the following deficits and impairments:   Body Structure / Function / Physical Skills: UE functional use, Coordination, FMC, Dexterity, Strength, ROM Cognitive Skills: Attention, Memory, Emotional, Problem Solve, Safety Awareness     Visit Diagnosis: Muscle weakness (generalized)  Other lack of coordination  Hemiplegia and hemiparesis following cerebral infarction affecting left non-dominant side (HCC)  Vision impairment    Problem List Patient Active Problem List   Diagnosis Date Noted  . Sepsis ([place]) 10/18/2018    T , OTR/L, CLT  , 07/30/2019, 11:43 AM  [place] MAIN [facility] SERVICES [address] [place], [place], [address] Phone: [phone]   Fax:  [phone]  Name: Kenneth Summers MRN: [id number] Date of Birth: [birth year]

## 2019-07-31 ENCOUNTER — Ambulatory Visit: Payer: Medicare Other | Admitting: Physical Therapy

## 2019-07-31 ENCOUNTER — Ambulatory Visit: Payer: Medicare Other | Admitting: Occupational Therapy

## 2019-07-31 ENCOUNTER — Other Ambulatory Visit: Payer: Self-pay

## 2019-07-31 ENCOUNTER — Encounter: Payer: Self-pay | Admitting: Occupational Therapy

## 2019-07-31 DIAGNOSIS — M6281 Muscle weakness (generalized): Secondary | ICD-10-CM

## 2019-07-31 DIAGNOSIS — R278 Other lack of coordination: Secondary | ICD-10-CM

## 2019-07-31 DIAGNOSIS — R262 Difficulty in walking, not elsewhere classified: Secondary | ICD-10-CM

## 2019-07-31 DIAGNOSIS — R296 Repeated falls: Secondary | ICD-10-CM

## 2019-07-31 NOTE — Therapy (Signed)
[place] MAIN [place] SERVICES [address] [place], [place], [address] Phone: [phone]   Fax:  [phone]  Occupational Therapy Treatment  Patient Details  Name: Kenneth Summers MRN: [id number] Date of Birth: 12-14-1945 No data recorded  Encounter Date: 07/31/2019   OT End of Session - 07/31/19 1319    Visit Number 102    Number of Visits 157    Date for OT Re-Evaluation 09/15/19    Authorization Type Progress report period starting 07/21/2019    OT Start Time 1300    OT Stop Time 1345    OT Time Calculation (min) 45 min    Activity Tolerance Patient tolerated treatment well    Behavior During Therapy [facility] for tasks assessed/performed           Past Medical History:  Diagnosis Date  . Actinic keratosis   . Alcohol abuse   . APS (antiphospholipid syndrome) ([place])   . Basal cell carcinoma   . BPH (benign prostatic hyperplasia)   . CAD (coronary artery disease)   . Cellulitis   . Cervical spinal stenosis   . Chronic osteomyelitis ([place])   . CKD (chronic kidney disease)   . CKD (chronic kidney disease)   . Clostridium difficile diarrhea   . Depression   . Diplopia   . Fatigue   . GERD (gastroesophageal reflux disease)   . Hyperlipemia   . Hypertension   . Hypovitaminosis D   . IBS (irritable bowel syndrome)   . 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 Insomnia   . Left lumbosacral radiculopathy   . Moderate obstructive sleep apnea   . Osteomyelitis of foot ([place])   . PAD (peripheral artery disease) ([place])   . Pruritus   . RA (rheumatoid arthritis) ([place])   . Radiculopathy   . Restless legs syndrome   . RLS (restless legs syndrome)   . SLE (systemic lupus erythematosus related syndrome) ([place])   . 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 Stroke ([place])   . Toxic maculopathy of both eyes     Past Surgical History:  Procedure Laterality Date  . CARDIAC CATHETERIZATION    . CAROTID ENDARTERECTOMY    .  CERVICAL LAMINECTOMY    . CORONARY ANGIOPLASTY    . FRACTURE SURGERY    . fusion C5-6-7    . HERNIA REPAIR    . KNEE ARTHROSCOPY    . TONSILLECTOMY      There were no vitals filed for this visit.   Subjective Assessment - 07/31/19 1315    Patient is accompanied by: Family member    Pertinent History Pt. is a 74 y.o. 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 Pt. PMHx includes: Multiple Falls, Lupus, DJD, Renal Abscess, CVA. Pt. resides with his wife. Pt.'s wife and daughter assist with ADLs. Pt. has caregivers in  for 2 hours a day, 6 days a week. Pt. received Rehab services in acute care, at SNF for STR, and [place] services. Pt. is retired from [employer] for [employer] and [employer].    Patient Stated Goals To regain use of his left UE, and do more for himself.    Currently in Pain? No/denies          OTTREATMENT   Therapeutic Activities:  Pt. worked onusing his left hand forgrasping,and placing 2"large pegs on the [employer] placed at a tabletop surface. Pt.worked on following  [employer] patterns. Pt. required less cues to follow the design pattern, and place the pegs in the accurate spot on the board. Pt. was able to complete a simple design efficiently, and required increased time to complete a design of moderate complexity.  Pt. has bilateral padded sleeves in place. Pt. presented with improved attention, and task initiation. Pt. required minimal cues to use his left hand, and minimal cues for accuracy followingmore complexdesign patterns. Pt. is improving with efficiently in completing visual perceptual design patterns. Pt. continues to work on these skills in order to work towards increasing engagement of the LUE during ADLs, and IADL tasks.  Pt. Continues to work on improving LUE functioning in order to work improving overall independence with ADLs, and IADLs.                        OT  Education - 07/31/19 1318    Education Details pattern designs, identifying patterns and strategies for completion of design copy    Person(s) Educated Patient    Methods Explanation;Demonstration    Comprehension Returned demonstration;Tactile cues required;Verbalized understanding;Need further instruction               OT Long Term Goals - 07/23/19 0826      OT LONG TERM GOAL #1   Title Pt. will increase left shoulder flexion AROM by 10 degrees to access cabinet/shelf.    Baseline Shoulder flexion:130. Pt. is progressing, and continues to work on reaching with increasing emphasis on flexing trunk to improve functional reach.  07/21/19- shoulder flexion to 130 with effort    Time 12    Period Weeks    Status Partially Met    Target Date 09/15/19      OT LONG TERM GOAL #2   Title Pt. will donn a shirt with Supervision.    Baseline Pt. continues to require MinA donning a zip down jacket with consistent cues to initiate the correct direction of the jacket.  07/21/19 continues to require min assist at times for shirt/jacket    Time 12    Status On-going    Target Date 09/15/19      OT LONG TERM GOAL #4   Title Pt. will improve left grip strength by 10# to be able to open a jar/container.    Baseline Pt. continues to work towards opening containers, and bottles.    Time 12    Period Weeks    Status On-going    Target Date 09/15/19      OT LONG TERM GOAL #5   Title Pt. will improve left hand [facility] skills to be able to assist with buttoning/zipping.    Baseline Pt. is improving with manipulating the zipper on his jacket, however continues to require practice. Pt. continues to work on improving bilateral [facility] skills for buttoning/zipping.  07/21/19 improving with buttons and zippers.    Time 12    Period Weeks    Status On-going    Target Date 09/15/19      OT LONG TERM GOAL #6   Title Pt. will demonstrate visual conmpensatory strategies for 100% of the time during ADLs    Baseline  Pt. requires  fewer cues for left sides awareness.    Time 12    Period Weeks    Status Partially Met    Target Date 09/15/19      OT LONG TERM GOAL #8   Title Pt. will accurately identify potential safety hazard  using good safety awareness, and judgement 100% for ADLs, and IADLs.    Baseline Pt. requires cues    Time 12    Period Weeks    Status On-going    Target Date 09/15/19      OT LONG TERM GOAL  #9   TITLE Pt. will  navigate his w/c around obstacles with Supervision and 100% accuracy    Baseline Pt. continues to require cuing for obstacle on the left, however requires less cues. 07/21/2019 continues to improve with decreased assist at times    Time 12    Period Weeks    Status On-going    Target Date 09/15/19      OT LONG TERM GOAL  #11   TITLE Pt. will increase BUE strength by 60m grades in preparation for ADL transfers.    Baseline , 07/21/19 continues to work towards improved strength, LUE 3-/5    Time 12    Period Weeks    Status On-going    Target Date 09/15/19      OT LONG TERM GOAL  #12   TITLE Pt. will improve LUE functional reaching with the left engaging the trunk 100% of the time during ADL tasks with minimal cues.    Baseline 07/21/2019: Pt. is improving with LUE functional reachin, howwver requires cues to engage trunk for extended reaching.    Time 12    Period Weeks    Status On-going    Target Date 09/15/19                 Plan - 07/31/19 1320    Clinical Impression Statement Pt. has bilateral padded sleeves in place. Pt. presented with improved attention, and task initiation. Pt. required minimal cues to use his left hand, and minimal cues for accuracy followingmore complexdesign patterns. Pt. is improving with efficiently in completing visual perceptual design patterns. Pt. continues to work on these skills in order to work towards increasing engagement of the LUE during ADLs, and IADL tasks.  Pt. Continues to work on improving LUE functioning  in order to work improving overall independence with ADLs, and IADLs.   OT Occupational Profile and History Problem Focused Assessment - Including review of records relating to presenting problem    Occupational performance deficits (Please refer to evaluation for details): ADL's    Body Structure / Function / Physical Skills UE functional use;Coordination;FMC;Dexterity;Strength;ROM    Cognitive Skills Attention;Memory;Emotional;Problem Solve;Safety Awareness    Rehab Potential Good    Clinical Decision Making Several treatment options, min-mod task modification necessary    Comorbidities Affecting Occupational Performance: Presence of comorbidities impacting occupational performance    Comorbidities impacting occupational performance description: Phyical, cognitive, visual,  medical comorbidities    Modification or Assistance to Complete Evaluation  No modification of tasks or assist necessary to complete eval    OT Frequency 2x / week    OT Duration 12 weeks    OT Treatment/Interventions Self-care/ADL training;DME and/or AE instruction;Therapeutic exercise;Therapeutic activities;Moist Heat;Cognitive remediation/compensation;Neuromuscular education;Visual/perceptual remediation/compensation;Coping strategies training;Patient/family education;Passive range of motion;Psychosocial skills training;Energy conservation;Functional Mobility Training    Consulted and Agree with Plan of Care Patient           Patient will benefit from skilled therapeutic intervention in order to improve the following deficits and impairments:   Body Structure / Function / Physical Skills: UE functional use, Coordination, FMC, Dexterity, Strength, ROM Cognitive Skills: Attention, Memory, Emotional, Problem Solve, Safety Awareness     Visit Diagnosis: Muscle weakness (generalized)  Other lack of coordination    Problem List Patient Active Problem List   Diagnosis Date Noted  . Sepsis ([place]) 10/18/2018     Harrel Carina, MS, OTR/L 07/31/2019, 1:39 PM  [place] MAIN [facility] SERVICES [address] [place], [place], [address] Phone: [phone]   Fax:  [phone]  Name: Kenneth Summers MRN: [id number] Date of Birth: 1945-08-24

## 2019-07-31 NOTE — Therapy (Signed)
[place] MAIN [facility] SERVICES [address] [place], [place], [address] Phone: [phone]   Fax:  [phone]  Physical Therapy Treatment  Patient Details  Name: Kenneth Summers MRN: [id number] Date of Birth: [birth year] No data recorded  Encounter Date: 07/31/2019   PT End of Session - 08/04/19 1007    Visit Number [id number]    Number of Visits 180    Date for PT Re-Evaluation 08/20/19    Authorization Type Medicare reporting period starting 09/11/18    Authorization Time Period goals last assessed 05/26/19    PT Start Time 1346    PT Stop Time 1430    PT Time Calculation (min) 44 min    Equipment Utilized During Treatment Gait belt    Activity Tolerance Patient tolerated treatment well;No increased pain    Behavior During Therapy WFL for tasks assessed/performed           Past Medical History:  Diagnosis Date  . Actinic keratosis   . Alcohol abuse   . APS (antiphospholipid syndrome) ([place])   . Basal cell carcinoma   . BPH (benign prostatic hyperplasia)   . CAD (coronary artery disease)   . Cellulitis   . Cervical spinal stenosis   . Chronic osteomyelitis ([place])   . CKD (chronic kidney disease)   . CKD (chronic kidney disease)   . Clostridium difficile diarrhea   . Depression   . Diplopia   . Fatigue   . GERD (gastroesophageal reflux disease)   . Hyperlipemia   . Hypertension   . Hypovitaminosis D   . IBS (irritable bowel syndrome)   . 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 Insomnia   . Left lumbosacral radiculopathy   . Moderate obstructive sleep apnea   . Osteomyelitis of foot ([place])   . PAD (peripheral artery disease) ([place])   . Pruritus   . RA (rheumatoid arthritis) ([place])   . Radiculopathy   . Restless legs syndrome   . RLS (restless legs syndrome)   . SLE (systemic lupus erythematosus related syndrome) ([place])   . 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 Stroke ([place])   . Toxic maculopathy of both eyes      Past Surgical History:  Procedure Laterality Date  . CARDIAC CATHETERIZATION    . CAROTID ENDARTERECTOMY    . CERVICAL LAMINECTOMY    . CORONARY ANGIOPLASTY    . FRACTURE SURGERY    . fusion C5-6-7    . HERNIA REPAIR    . KNEE ARTHROSCOPY    . TONSILLECTOMY      There were no vitals filed for this visit.   Subjective Assessment - 08/04/19 1006    Subjective Patient reports doing well; denies any pain; reports feeling some soreness in BLE thigh after last session;    Patient is accompained by: Family member    Pertinent History Patient is a 74 y.o. male who presents to outpatient physical therapy with a referral for medical diagnosis of CVA. This patient's chief complaints consist of left hemiplegia and overall deconditioning leading to the following functional deficits: dependent for ADLs, IADLs, unable to transfer to car, dependent transfers at home with hoyer lift, unable to walk or stand without significant assistance, difficulty with W/C navigation..    Limitations Sitting;Lifting;Standing;House hold activities;Writing;Walking;Other (comment)    Currently in Pain? No/denies    Pain Onset More than a month ago    Multiple Pain Sites No  TREATMENT: Prior to transfersand in between transfers: Patient sittingin wheelchair: -hip flexion marchx15reps bilaterally -unsupported sitting (arms across chest) trunk rotation side/side 2x3 reps with cues for forward weight shift to avoid falling backwards -deep breathing x5 reps;    Ptrequires verbal and visual cues to improve ROM; He was able to exhibit improved LLE AROM with cues; Patient also requires cues for erect posture for better trunk control;   Following exercise: Transferred sit<>Stand in parallel bars (seat adjusted to 27 inch from floor height) x10reps throughout session;He was able to transferCGA to supervision, herequiredmodVCs for forward weight shift for better trunk control and  transfer ability;Patient does exhibit increased lateral lean to left side this session;  Standing in parallel bars: Standing,min Awith min VCs for weight shift and increased extension for better stance control; -lateral weight shift side/side x10 reps with min VCs for erect posture and increased hip/knee extension;  -Mini squat x10repswith CGA and min VCs for weight shift and to increase LE muscle activation; -Toe taps on 6 inch step x8 reps RLE, x2-3 reps x2 sets LLE;   Patient initially exhibits flexed posture with decreased hip extension;he also exhibits heavy lean toleftside requiring instruction for weight shift to neutral position;    Patient requiredmodA to scoot back in chair at end of session due to increased fatigue  Response to treatment: [place]. Patient was able to exhibit better weight shift this session with improved weight bearing in BLE. He does fatigue quickly but was able to do increased activities/exercise with fewer rest breaks.                          PT Education - 08/04/19 1007    Education Details LE exercise, strengthening, transfers/HEP    Person(s) Educated Patient    Methods Explanation;Verbal cues    Comprehension Verbalized understanding;Returned demonstration;Verbal cues required;Need further instruction            PT Short Term Goals - 05/28/19 0717      PT SHORT TERM GOAL #1   Title Be independent with initial home exercise program for self-management of symptoms.    Baseline HEP to be given at second session; advice to practice unsupported sitting at home (06/19/2018); 07/08/18: Performing at home, 6/29: needs assistance but is doing exercise program regularly; 08/22/18: adherent;    Time 2    Period Weeks    Status Achieved    Target Date 06/26/18             PT Long Term Goals - 07/28/19 1301      PT LONG TERM GOAL #1   Title Patient will complete all bed mobility with min A  to improve functional independence for getting in and out of bed and adjusting in bed.     Baseline max A - total A reported by family (06/19/2018); 07/08/18: still requires maxA, dtr reports improvement in rolling since starting therapy; 6/23: min A to supervision in clinic; not doing at home right; 8/5: Pt's daughter states that his rolling has improved, but still has difficulty with all other bed mobility; 10/23/18: pts daughter states that she is doing about 35% of the work if he is in proper position, he does uses the bed rails to help roll;  using Hoyer for coming to sit EOB; 10/12: pts daughter states that she is doing about 30% of the work if he is in proper position, he does uses the bed rails to help roll, but  it is improving;  using Hoyer for coming to sit EOB, 10/29: min A for sit to sidelying, rolling supervision, able to transition sidelying to sit with HHA;    Time 12    Period Weeks    Status Achieved      PT LONG TERM GOAL #2   Title Patient will complete sit <> stand transfer chair to chair with Min A and LRAD to improve functional independence for household and community mobility.     Baseline supervision for STS, still requires elevated surface height to 27"    Time 12    Period Weeks    Status Achieved      PT LONG TERM GOAL #3   Title Patient will navigate power w/c with min A x 100 feet to improve mobility for household and short community distances.    Baseline required total A (06/12/2018); able to roll 20 feet with min A (06/20/2018); 07/08/18: Pt can go approximately 10' without assist per family;  10/23/2018: pt's daughter states that his power WC mobility has become 100% better with the new hand control, slowed speed, and with practice; 10/12: pt's daughter reports that he can perform household WC mobility and limited community ambulation with minA    Time 12    Period Weeks    Status Achieved      PT LONG TERM GOAL #4   Title Patient will complete car transfers W/C to family  SUV with min A using LRAD to improve his abilty to participate in community activities.     Baseline unable to complete car transfers at all (06/12/2018); unable to complete car transfers at all (06/19/2018).; 07/08/18: Unable to perform at this time; 09/11/18: unable to perform at this time; 10/23/2018: unable to complete car transfers at all; 10/12:  unable to complete car transfers at all, 10/29: unable; 01/13/19: unable; 2/11: min-mod A +2, 3/10: min A+1    Time 12    Period Weeks    Status Achieved      PT LONG TERM GOAL #5   Title Patient will ambulate at least 10 feet with min A using LRAD to improve mobility for household and community distances.    Baseline unable to take steps (06/12/2018); able to shift R leg forward and back with max A and RW at edge of plinth (06/19/2018); 07/08/18: unable to ambulate at this time. 09/11/18: unable to perform at this time; 10/23/2018: unable to perform at this time; 10/12: unable to perform at this time, 10/29: unable at this time; 01/13/19: unable at this time, 2/11: min A to step with RLE, unable to step with LLE, 3/10: able to take 2 steps in parallel bars with min A for safety; 4/1: min A for walking 10 feet in parallel bars, 5/24: min A walking 10 feet in parallel bars; 6/21: no change    Time 12    Period Weeks    Status Partially Met    Target Date 08/20/19      PT LONG TERM GOAL #6   Title Patient will be able to safety navigate up/down ramp with power chair, exhibiting good safety awareness, mod I to safely enter/exit his home.    Baseline 2/11: supervision with intermittent min A;4/1: supervision; 5/24: supervision    Time 12    Period Weeks    Status Partially Met    Target Date 08/20/19      PT LONG TERM GOAL #7   Title Patient will increase functional reach test to >  15 inches in sitting to exhibit improved sitting balance and positioning and reduce fall risk;    Baseline 07/30/18: 12 inches; 09/11/18: 10-12 inches; 10/23/18: 12-13 inch; 10/12: 14.5",  10/29: 15.5 inch    Time 4    Period Weeks    Status Achieved    Target Date 08/20/19      PT LONG TERM GOAL #8   Title Patient will increase LLE quad strength to 3+/5 to improve functional strength for standing and mobility;    Baseline 10/28: 2+/5; 01/13/19: 2+/5, 2/11: 3-/5, 04/16/19: 3-/5, 05/08/19: 3-/5, 5/24: 3-/5, 6/21: 3-/5    Time 12    Period Weeks    Status Partially Met    Target Date 08/20/19      PT LONG TERM GOAL  #9   TITLE Patient will report no vertigo with provoking motions or positions in order to be able to perform ADLs and mobility tasks without symptoms.    Baseline 06/30/19: reports no dizziness in last week; 6/21: one episode this weekend;    Time 12    Period Weeks    Status Partially Met    Target Date 08/20/19                 Plan - 08/04/19 1007    Clinical Impression Statement Patient tolerated session well. Patient instructed in advanced transfers/positioning. He was able to transfer with supervision but does require min A for reorienting to midline. patient is progressing with LLE strengthening with increased ROM. Patient does require min VCs for proper positioning and exercise technique. Patient does report increased fatigue at end of session. He would benefit from additional skilled PT intervention to improve strength and mobility;    Personal Factors and Comorbidities Age;Comorbidity 3+    Comorbidities Relevant past medical history and comorbidities include long term steroid use for lupus, CKD, chronic osteomyelitis, cervical spine stenosis, BPH, APS, alcohol abuse, peripheral artery disease, depression, GERD, obstructive sleep apnea, HTN, IBS, IgA deficiency, lumbar radiculopathy, RA, Stroke, toxic maculopathy in both eyes, systemic lupus erythematosus related syndrome, cardiac catheterization, carotid endarterectomy, cervical laminectomy, coronary angioplasty, Fusion C5-C7, knee arthroplasty, lumbar surgery.    Examination-Activity Limitations Bed  Mobility;Bend;Sit;Toileting;Stand;Stairs;Lift;Transfers;Squat;Locomotion Level;Carry;Dressing;Hygiene/Grooming;Continence    Examination-Participation Restrictions Yard Work;Interpersonal Relationship;Community Activity    Stability/Clinical Decision Making Evolving/Moderate complexity    Rehab Potential Fair    PT Frequency 3x / week    PT Duration 12 weeks    PT Treatment/Interventions ADLs/Self Care Home Management;Electrical Stimulation;Moist Heat;Cryotherapy;Gait training;Stair training;Functional mobility training;Therapeutic exercise;Balance training;Neuromuscular re-education;Cognitive remediation;Patient/family education;Orthotic Fit/Training;Wheelchair mobility training;Manual techniques;Passive range of motion;Energy conservation;Joint Manipulations;Canalith Repostioning;Therapeutic activities;Vestibular    PT Next Visit Plan Work on strength, sitting/standing balance, functional activities such as rolling, bed mobility, transfers; caregiver training    PT Home Exercise Plan no updates this session    Consulted and Agree with Plan of Care Patient;Family member/caregiver    Family Member Consulted Wife           Patient will benefit from skilled therapeutic intervention in order to improve the following deficits and impairments:  Abnormal gait, Decreased activity tolerance, Decreased cognition, Decreased endurance, Decreased knowledge of use of DME, Decreased range of motion, Decreased skin integrity, Decreased strength, Impaired perceived functional ability, Impaired sensation, Impaired UE functional use, Improper body mechanics, Pain, Cardiopulmonary status limiting activity, Decreased balance, Decreased coordination, Decreased mobility, Difficulty walking, Impaired tone, Postural dysfunction, Dizziness  Visit Diagnosis: Muscle weakness (generalized)  Other lack of coordination  Difficulty in walking, not elsewhere classified  Repeated  falls     Problem List Patient Active  Problem List   Diagnosis Date Noted  . Sepsis ([place]) 10/18/2018    Fay Swider PT, DPT 08/04/2019, 10:09 AM  [place] MAIN [facility] SERVICES [address] [place], [place], [address] Phone: [phone]   Fax:  [phone]  Name: SKY BORBOA MRN: [id number] Date of Birth: [birth year]

## 2019-08-04 ENCOUNTER — Ambulatory Visit: Payer: Medicare Other | Admitting: Physical Therapy

## 2019-08-04 ENCOUNTER — Other Ambulatory Visit: Payer: Self-pay

## 2019-08-04 ENCOUNTER — Ambulatory Visit: Payer: Medicare Other | Admitting: Occupational Therapy

## 2019-08-04 ENCOUNTER — Encounter: Payer: Self-pay | Admitting: Physical Therapy

## 2019-08-04 ENCOUNTER — Encounter: Payer: Self-pay | Admitting: Occupational Therapy

## 2019-08-04 DIAGNOSIS — R296 Repeated falls: Secondary | ICD-10-CM

## 2019-08-04 DIAGNOSIS — M6281 Muscle weakness (generalized): Secondary | ICD-10-CM | POA: Diagnosis not present

## 2019-08-04 DIAGNOSIS — R278 Other lack of coordination: Secondary | ICD-10-CM

## 2019-08-04 DIAGNOSIS — R262 Difficulty in walking, not elsewhere classified: Secondary | ICD-10-CM

## 2019-08-04 NOTE — Therapy (Signed)
[place] MAIN [facility] SERVICES [address] [place], [place], [address] Phone: [phone]   Fax:  [phone]  Occupational Therapy Treatment  Patient Details  Name: Kenneth Summers MRN: [id number] Date of Birth: 1945-10-10 No data recorded  Encounter Date: 08/04/2019   OT End of Session - 08/04/19 1353    Visit Number 124    Number of Visits 157    Date for OT Re-Evaluation 09/15/19    Authorization Type Progress report period starting 07/21/2019    OT Start Time 1347    OT Stop Time 1430    OT Time Calculation (min) 43 min    Activity Tolerance Patient tolerated treatment well    Behavior During Therapy [facility] for tasks assessed/performed           Past Medical History:  Diagnosis Date  . Actinic keratosis   . Alcohol abuse   . APS (antiphospholipid syndrome) ([place])   . Basal cell carcinoma   . BPH (benign prostatic hyperplasia)   . CAD (coronary artery disease)   . Cellulitis   . Cervical spinal stenosis   . Chronic osteomyelitis ([place])   . CKD (chronic kidney disease)   . CKD (chronic kidney disease)   . Clostridium difficile diarrhea   . Depression   . Diplopia   . Fatigue   . GERD (gastroesophageal reflux disease)   . Hyperlipemia   . Hypertension   . Hypovitaminosis D   . IBS (irritable bowel syndrome)   . 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 Insomnia   . Left lumbosacral radiculopathy   . Moderate obstructive sleep apnea   . Osteomyelitis of foot ([place])   . PAD (peripheral artery disease) ([place])   . Pruritus   . RA (rheumatoid arthritis) ([place])   . Radiculopathy   . Restless legs syndrome   . RLS (restless legs syndrome)   . SLE (systemic lupus erythematosus related syndrome) ([place])   . 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 Stroke ([place])   . Toxic maculopathy of both eyes     Past Surgical History:  Procedure Laterality Date  . CARDIAC CATHETERIZATION    . CAROTID ENDARTERECTOMY    .  CERVICAL LAMINECTOMY    . CORONARY ANGIOPLASTY    . FRACTURE SURGERY    . fusion C5-6-7    . HERNIA REPAIR    . KNEE ARTHROSCOPY    . TONSILLECTOMY      There were no vitals filed for this visit.   Subjective Assessment - 08/04/19 1352    Subjective  Pt. reports feeling good today, and having had a great day yesterday.    Patient is accompanied by: Family member    Pertinent History Pt. is a 74 y.o. 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 Pt. PMHx includes: Multiple Falls, Lupus, DJD, Renal Abscess, CVA. Pt. resides with his wife. Pt.'s wife and daughter assist with ADLs. Pt. has caregivers in  for 2 hours a day, 6 days a week. Pt. received Rehab services in acute care, at SNF for STR, and [place] services. Pt. is retired from [employer] for [employer] and [employer].    Currently in Pain? No/denies          OTTREATMENT   Therapeutic Activities:  Pt. worked onusing his left hand forgrasping,and placing 2"large pegs on the [employer] placed at a tabletop surface. Pt.worked on following [employer] patterns. Pt. required less cues to follow the design pattern, and place the pegs in the accurate spot on the board for more complex design patterns. Pt. was able to complete a simple design efficiently, and required increased time to complete a design of moderate complexity.  Pt. continues to have bilateral padded sleeves in place.Pt. presented with improved attention, left sided awareness, and task initiation only requiring cues initially. Pt. required minimal cues to use his left hand, and moderate cues for accuracy following complexdesign patterns. Pt. required reclining rest breaks. Pt. is improving with efficiently completing visual perceptual design patterns. Pt. continues to work on these skills in order to work towards increasing engagement of the LUE during ADLs, and IADL tasks.Pt. continues to work on improving LUE  functioning in order to work improving overall independence with ADLs, and IADLs.                       OT Education - 08/04/19 1353    Education Details pattern designs, identifying patterns and strategies for completion of design copy    Person(s) Educated Patient    Methods Explanation;Demonstration    Comprehension Returned demonstration;Tactile cues required;Verbalized understanding;Need further instruction               OT Long Term Goals - 07/23/19 0826      OT LONG TERM GOAL #1   Title Pt. will increase left shoulder flexion AROM by 10 degrees to access cabinet/shelf.    Baseline Shoulder flexion:130. Pt. is progressing, and continues to work on reaching with increasing emphasis on flexing trunk to improve functional reach.  07/21/19- shoulder flexion to 130 with effort    Time 12    Period Weeks    Status Partially Met    Target Date 09/15/19      OT LONG TERM GOAL #2   Title Pt. will donn a shirt with Supervision.    Baseline Pt. continues to require MinA donning a zip down jacket with consistent cues to initiate the correct direction of the jacket.  07/21/19 continues to require min assist at times for shirt/jacket    Time 12    Status On-going    Target Date 09/15/19      OT LONG TERM GOAL #4   Title Pt. will improve left grip strength by 10# to be able to open a jar/container.    Baseline Pt. continues to work towards opening containers, and bottles.    Time 12    Period Weeks    Status On-going    Target Date 09/15/19      OT LONG TERM GOAL #5   Title Pt. will improve left hand [facility] skills to be able to assist with buttoning/zipping.    Baseline Pt. is improving with manipulating the zipper on his jacket, however continues to require practice. Pt. continues to work on improving bilateral [facility] skills for buttoning/zipping.  07/21/19 improving with buttons and zippers.    Time 12    Period Weeks    Status On-going    Target Date 09/15/19       OT LONG TERM GOAL #6   Title Pt. will demonstrate visual conmpensatory strategies for 100% of the time during ADLs    Baseline Pt. requires  fewer cues for left sides awareness.    Time 12    Period Weeks    Status Partially Met    Target Date 09/15/19      OT LONG  TERM GOAL #8   Title Pt. will accurately identify potential safety hazard using good safety awareness, and judgement 100% for ADLs, and IADLs.    Baseline Pt. requires cues    Time 12    Period Weeks    Status On-going    Target Date 09/15/19      OT LONG TERM GOAL  #9   TITLE Pt. will  navigate his w/c around obstacles with Supervision and 100% accuracy    Baseline Pt. continues to require cuing for obstacle on the left, however requires less cues. 07/21/2019 continues to improve with decreased assist at times    Time 12    Period Weeks    Status On-going    Target Date 09/15/19      OT LONG TERM GOAL  #11   TITLE Pt. will increase BUE strength by 57m grades in preparation for ADL transfers.    Baseline , 07/21/19 continues to work towards improved strength, LUE 3-/5    Time 12    Period Weeks    Status On-going    Target Date 09/15/19      OT LONG TERM GOAL  #12   TITLE Pt. will improve LUE functional reaching with the left engaging the trunk 100% of the time during ADL tasks with minimal cues.    Baseline 07/21/2019: Pt. is improving with LUE functional reachin, howwver requires cues to engage trunk for extended reaching.    Time 12    Period Weeks    Status On-going    Target Date 09/15/19                 Plan - 08/04/19 1354    Clinical Impression Statement Pt. continues to have bilateral padded sleeves in place.Pt. presented with improved attention, left sided awareness, and task initiation only requiring cues initially. Pt. required minimal cues to use his left hand, and moderate cues for accuracy following complexdesign patterns. Pt. required reclining rest breaks. Pt. is improving with  efficiently completing visual perceptual design patterns. Pt. continues to work on these skills in order to work towards increasing engagement of the LUE during ADLs, and IADL tasks.Pt. continues to work on improving LUE functioning in order to work improving overall independence with ADLs, and IADLs..   OT Occupational Profile and History Problem Focused Assessment - Including review of records relating to presenting problem    Occupational performance deficits (Please refer to evaluation for details): ADL's    Body Structure / Function / Physical Skills UE functional use;Coordination;FMC;Dexterity;Strength;ROM    Cognitive Skills Attention;Memory;Emotional;Problem Solve;Safety Awareness    Rehab Potential Good    Clinical Decision Making Several treatment options, min-mod task modification necessary    Comorbidities Affecting Occupational Performance: Presence of comorbidities impacting occupational performance    Comorbidities impacting occupational performance description: Phyical, cognitive, visual,  medical comorbidities    Modification or Assistance to Complete Evaluation  No modification of tasks or assist necessary to complete eval    OT Frequency 2x / week    OT Duration 12 weeks    OT Treatment/Interventions Self-care/ADL training;DME and/or AE instruction;Therapeutic exercise;Therapeutic activities;Moist Heat;Cognitive remediation/compensation;Neuromuscular education;Visual/perceptual remediation/compensation;Coping strategies training;Patient/family education;Passive range of motion;Psychosocial skills training;Energy conservation;Functional Mobility Training    Consulted and Agree with Plan of Care Patient           Patient will benefit from skilled therapeutic intervention in order to improve the following deficits and impairments:   Body Structure / Function / Physical Skills: UE functional use, Coordination,  [place], Dexterity, Strength, ROM Cognitive Skills: Attention, Memory,  Emotional, Problem Solve, Safety Awareness     Visit Diagnosis: Muscle weakness (generalized)  Other lack of coordination    Problem List Patient Active Problem List   Diagnosis Date Noted  . Sepsis ([place]) 10/18/2018    Harrel Carina, MS, OTR/L 08/04/2019, 1:57 PM  [place] MAIN [facility] SERVICES [address] [place], [place], [address] Phone: [phone]   Fax:  [phone]  Name: Kenneth Summers MRN: [id number] Date of Birth: 1945-08-16

## 2019-08-04 NOTE — Therapy (Signed)
[place] [facility] SERVICES [address] [place], [place], [address] Phone: [phone]   Fax:  [phone]  Physical Therapy Treatment  Patient Details  Name: Kenneth Summers MRN: [id number] Date of Birth: 05-Oct-1945 No data recorded  Encounter Date: 08/04/2019   PT End of Session - 08/04/19 1256    Visit Number 153    Number of Visits 180    Date for PT Re-Evaluation 08/20/19    Authorization Type Medicare reporting period starting 09/11/18    Authorization Time Period goals last assessed 05/26/19    PT Start Time 1302    PT Stop Time 1345    PT Time Calculation (min) 43 min    Equipment Utilized During Treatment Gait belt    Activity Tolerance Patient tolerated treatment well;No increased pain    Behavior During Therapy WFL for tasks assessed/performed           Past Medical History:  Diagnosis Date  . Actinic keratosis   . Alcohol abuse   . APS (antiphospholipid syndrome) ([place])   . Basal cell carcinoma   . BPH (benign prostatic hyperplasia)   . CAD (coronary artery disease)   . Cellulitis   . Cervical spinal stenosis   . Chronic osteomyelitis ([place])   . CKD (chronic kidney disease)   . CKD (chronic kidney disease)   . Clostridium difficile diarrhea   . Depression   . Diplopia   . Fatigue   . GERD (gastroesophageal reflux disease)   . Hyperlipemia   . Hypertension   . Hypovitaminosis D   . IBS (irritable bowel syndrome)   . 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 Insomnia   . Left lumbosacral radiculopathy   . Moderate obstructive sleep apnea   . Osteomyelitis of foot ([place])   . PAD (peripheral artery disease) ([place])   . Pruritus   . RA (rheumatoid arthritis) ([place])   . Radiculopathy   . Restless legs syndrome   . RLS (restless legs syndrome)   . SLE (systemic lupus erythematosus related syndrome) ([place])   . 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 Stroke ([place])   . Toxic maculopathy of both eyes      Past Surgical History:  Procedure Laterality Date  . CARDIAC CATHETERIZATION    . CAROTID ENDARTERECTOMY    . CERVICAL LAMINECTOMY    . CORONARY ANGIOPLASTY    . FRACTURE SURGERY    . fusion C5-6-7    . HERNIA REPAIR    . KNEE ARTHROSCOPY    . TONSILLECTOMY      There were no vitals filed for this visit.   Subjective Assessment - 08/04/19 1318    Subjective Patient reports doing well; Reports mild fatigue today otherwise is doing good. Denies any pain or soreness.    Patient is accompained by: Family member    Pertinent History Patient is a 74 y.o. male who presents to outpatient physical therapy with a referral for medical diagnosis of CVA. This patient's chief complaints consist of left hemiplegia and overall deconditioning leading to the following functional deficits: dependent for ADLs, IADLs, unable to transfer to car, dependent transfers at home with hoyer lift, unable to walk or stand without significant assistance, difficulty with W/C navigation..    Limitations Sitting;Lifting;Standing;House hold activities;Writing;Walking;Other (comment)    Currently in Pain? No/denies    Pain Onset More than a month ago    Multiple Pain Sites No  TREATMENT: Prior to transfersand in between transfers: Patient sittingin wheelchair: -hip flexion marchx10reps bilaterally -arms in lap, alternate UE lift x5 reps unsupported sitting to challenge trunk control with cues for neutral weight shift;  -deep breathing x5 reps; Reinforced HEP with instruction to focus on isolating one leg at a time to help with disassociation;   Ptrequires verbal and visual cues to improve ROM;He was able to exhibit improved LLE AROM with cues; Patient also requires cues for erect posture for better trunk control;  Following exercise: Transferred sit<>Stand in parallel bars (seat adjusted to 27 inch from floor height) x10reps throughout session;He was able to  transferCGA to supervision, herequiredmodVCs for forward weight shift for better trunk control and transfer ability;Patient does exhibit increased lateral lean to left side this session;  Standing in parallel bars: Standing,min Awith min VCs for weight shift and increased extension for better stance control; -lateral weight shift side/side with hands on therapist shoulders x5 reps with min A for balance/safety;  -Mini squatx15repswith CGA and min VCs for weight shift and to increase LE muscle activation; -heel lifts with cues for knee extension for better stance control x10 reps;  -Toe taps on 6 inch stepx10 reps RLE, x2-3 reps x2 sets LLE;   Patient initially exhibits flexed posture with decreased hip extension;he also exhibits heavy lean toleftside requiring instruction for weight shift to neutral position;    Patient requiredmodA to scoot back in chair at end of session due to increased fatigue  Response to treatment: [place]. Patient was able to exhibit better weight shift this session with improved weight bearing in BLE. He does fatigue quickly but was able to do increased activities/exercise with fewer rest breaks.                    PT Education - 08/04/19 1256    Education Details LE exercise, strengthening, transfers/HEP    Person(s) Educated Patient    Methods Explanation;Verbal cues    Comprehension Verbalized understanding;Returned demonstration;Verbal cues required;Need further instruction            PT Short Term Goals - 05/28/19 0717      PT SHORT TERM GOAL #1   Title Be independent with initial home exercise program for self-management of symptoms.    Baseline HEP to be given at second session; advice to practice unsupported sitting at home (06/19/2018); 07/08/18: Performing at home, 6/29: needs assistance but is doing exercise program regularly; 08/22/18: adherent;    Time 2    Period Weeks    Status  Achieved    Target Date 06/26/18             PT Long Term Goals - 07/28/19 1301      PT LONG TERM GOAL #1   Title Patient will complete all bed mobility with min A to improve functional independence for getting in and out of bed and adjusting in bed.     Baseline max A - total A reported by family (06/19/2018); 07/08/18: still requires maxA, dtr reports improvement in rolling since starting therapy; 6/23: min A to supervision in clinic; not doing at home right; 8/5: Pt's daughter states that his rolling has improved, but still has difficulty with all other bed mobility; 10/23/18: pts daughter states that she is doing about 35% of the work if he is in proper position, he does uses the bed rails to help roll;  using Hoyer for coming to sit EOB; 10/12: pts daughter states that she is doing  about 30% of the work if he is in proper position, he does uses the bed rails to help roll, but it is improving;  using Hoyer for coming to sit EOB, 10/29: min A for sit to sidelying, rolling supervision, able to transition sidelying to sit with HHA;    Time 12    Period Weeks    Status Achieved      PT LONG TERM GOAL #2   Title Patient will complete sit <> stand transfer chair to chair with Min A and LRAD to improve functional independence for household and community mobility.     Baseline supervision for STS, still requires elevated surface height to 27"    Time 12    Period Weeks    Status Achieved      PT LONG TERM GOAL #3   Title Patient will navigate power w/c with min A x 100 feet to improve mobility for household and short community distances.    Baseline required total A (06/12/2018); able to roll 20 feet with min A (06/20/2018); 07/08/18: Pt can go approximately 10' without assist per family;  10/23/2018: pt's daughter states that his power WC mobility has become 100% better with the new hand control, slowed speed, and with practice; 10/12: pt's daughter reports that he can perform household WC mobility and  limited community ambulation with minA    Time 12    Period Weeks    Status Achieved      PT LONG TERM GOAL #4   Title Patient will complete car transfers W/C to family SUV with min A using LRAD to improve his abilty to participate in community activities.     Baseline unable to complete car transfers at all (06/12/2018); unable to complete car transfers at all (06/19/2018).; 07/08/18: Unable to perform at this time; 09/11/18: unable to perform at this time; 10/23/2018: unable to complete car transfers at all; 10/12:  unable to complete car transfers at all, 10/29: unable; 01/13/19: unable; 2/11: min-mod A +2, 3/10: min A+1    Time 12    Period Weeks    Status Achieved      PT LONG TERM GOAL #5   Title Patient will ambulate at least 10 feet with min A using LRAD to improve mobility for household and community distances.    Baseline unable to take steps (06/12/2018); able to shift R leg forward and back with max A and RW at edge of plinth (06/19/2018); 07/08/18: unable to ambulate at this time. 09/11/18: unable to perform at this time; 10/23/2018: unable to perform at this time; 10/12: unable to perform at this time, 10/29: unable at this time; 01/13/19: unable at this time, 2/11: min A to step with RLE, unable to step with LLE, 3/10: able to take 2 steps in parallel bars with min A for safety; 4/1: min A for walking 10 feet in parallel bars, 5/24: min A walking 10 feet in parallel bars; 6/21: no change    Time 12    Period Weeks    Status Partially Met    Target Date 08/20/19      PT LONG TERM GOAL #6   Title Patient will be able to safety navigate up/down ramp with power chair, exhibiting good safety awareness, mod I to safely enter/exit his home.    Baseline 2/11: supervision with intermittent min A;4/1: supervision; 5/24: supervision    Time 12    Period Weeks    Status Partially Met    Target Date  08/20/19      PT LONG TERM GOAL #7   Title Patient will increase functional reach test to >15 inches in  sitting to exhibit improved sitting balance and positioning and reduce fall risk;    Baseline 07/30/18: 12 inches; 09/11/18: 10-12 inches; 10/23/18: 12-13 inch; 10/12: 14.5", 10/29: 15.5 inch    Time 4    Period Weeks    Status Achieved    Target Date 08/20/19      PT LONG TERM GOAL #8   Title Patient will increase LLE quad strength to 3+/5 to improve functional strength for standing and mobility;    Baseline 10/28: 2+/5; 01/13/19: 2+/5, 2/11: 3-/5, 04/16/19: 3-/5, 05/08/19: 3-/5, 5/24: 3-/5, 6/21: 3-/5    Time 12    Period Weeks    Status Partially Met    Target Date 08/20/19      PT LONG TERM GOAL  #9   TITLE Patient will report no vertigo with provoking motions or positions in order to be able to perform ADLs and mobility tasks without symptoms.    Baseline 06/30/19: reports no dizziness in last week; 6/21: one episode this weekend;    Time 12    Period Weeks    Status Partially Met    Target Date 08/20/19                 Plan - 08/05/19 0811    Clinical Impression Statement Patient motivated and participated well within session.He does exhibit decreased trunk control initially with increased left lateral lean. However with increased repetition he was able to exhibit improved trunk control with better neutral weight shift. He does require cues for forward weight shift for better transfer ability. He also requries instruction for increased hip extension in stance for better weight bearing in BLE. Patient does fatigue quickly. He requires short seated rest breaks. Patient did require CGA to min A for most standing activities to facilitate better trunk control. Patient would benefit from additional skilled PT intervention to improve strength, balance and mobility;    Personal Factors and Comorbidities Age;Comorbidity 3+    Comorbidities Relevant past medical history and comorbidities include long term steroid use for lupus, CKD, chronic osteomyelitis, cervical spine stenosis, BPH, APS,  alcohol abuse, peripheral artery disease, depression, GERD, obstructive sleep apnea, HTN, IBS, IgA deficiency, lumbar radiculopathy, RA, Stroke, toxic maculopathy in both eyes, systemic lupus erythematosus related syndrome, cardiac catheterization, carotid endarterectomy, cervical laminectomy, coronary angioplasty, Fusion C5-C7, knee arthroplasty, lumbar surgery.    Examination-Activity Limitations Bed Mobility;Bend;Sit;Toileting;Stand;Stairs;Lift;Transfers;Squat;Locomotion Level;Carry;Dressing;Hygiene/Grooming;Continence    Examination-Participation Restrictions Yard Work;Interpersonal Relationship;Community Activity    Stability/Clinical Decision Making Evolving/Moderate complexity    Rehab Potential Fair    PT Frequency 3x / week    PT Duration 12 weeks    PT Treatment/Interventions ADLs/Self Care Home Management;Electrical Stimulation;Moist Heat;Cryotherapy;Gait training;Stair training;Functional mobility training;Therapeutic exercise;Balance training;Neuromuscular re-education;Cognitive remediation;Patient/family education;Orthotic Fit/Training;Wheelchair mobility training;Manual techniques;Passive range of motion;Energy conservation;Joint Manipulations;Canalith Repostioning;Therapeutic activities;Vestibular    PT Next Visit Plan Work on strength, sitting/standing balance, functional activities such as rolling, bed mobility, transfers; caregiver training    PT Home Exercise Plan no updates this session    Consulted and Agree with Plan of Care Patient;Family member/caregiver    Family Member Consulted Wife           Patient will benefit from skilled therapeutic intervention in order to improve the following deficits and impairments:  Abnormal gait, Decreased activity tolerance, Decreased cognition, Decreased endurance, Decreased knowledge of use of DME, Decreased range of  motion, Decreased skin integrity, Decreased strength, Impaired perceived functional ability, Impaired sensation, Impaired UE  functional use, Improper body mechanics, Pain, Cardiopulmonary status limiting activity, Decreased balance, Decreased coordination, Decreased mobility, Difficulty walking, Impaired tone, Postural dysfunction, Dizziness  Visit Diagnosis: Muscle weakness (generalized)  Other lack of coordination  Difficulty in walking, not elsewhere classified  Repeated falls     Problem List Patient Active Problem List   Diagnosis Date Noted  . Sepsis ([place]) 10/18/2018    Danzel Marszalek PT, DPT 08/05/2019, 8:23 AM  [place] [facility] SERVICES [address] [place], [place], [address] Phone: [phone]   Fax:  [phone]  Name: Kenneth Summers MRN: [id number] Date of Birth: 07-Jan-1946

## 2019-08-06 ENCOUNTER — Encounter: Payer: Self-pay | Admitting: Physical Therapy

## 2019-08-06 ENCOUNTER — Ambulatory Visit: Payer: Medicare Other | Admitting: Physical Therapy

## 2019-08-06 ENCOUNTER — Other Ambulatory Visit: Payer: Self-pay

## 2019-08-06 DIAGNOSIS — R278 Other lack of coordination: Secondary | ICD-10-CM

## 2019-08-06 DIAGNOSIS — R262 Difficulty in walking, not elsewhere classified: Secondary | ICD-10-CM

## 2019-08-06 DIAGNOSIS — M6281 Muscle weakness (generalized): Secondary | ICD-10-CM | POA: Diagnosis not present

## 2019-08-06 DIAGNOSIS — R296 Repeated falls: Secondary | ICD-10-CM

## 2019-08-06 NOTE — Therapy (Signed)
[place] MAIN [facility] SERVICES [address] [place], [place], [address] Phone: [phone]   Fax:  [phone]  Physical Therapy Treatment  Patient Details  Name: Kenneth Summers MRN: [id number] Date of Birth: [birth year] No data recorded  Encounter Date: 08/06/2019   PT End of Session - 08/06/19 1321    Visit Number [id number]    Number of Visits 180    Date for PT Re-Evaluation 08/20/19    Authorization Type Medicare reporting period starting 09/11/18    Authorization Time Period goals last assessed 05/26/19    PT Start Time 1302    PT Stop Time 1345    PT Time Calculation (min) 43 min    Equipment Utilized During Treatment Gait belt    Activity Tolerance Patient tolerated treatment well;No increased pain    Behavior During Therapy WFL for tasks assessed/performed           Past Medical History:  Diagnosis Date  . Actinic keratosis   . Alcohol abuse   . APS (antiphospholipid syndrome) ([place])   . Basal cell carcinoma   . BPH (benign prostatic hyperplasia)   . CAD (coronary artery disease)   . Cellulitis   . Cervical spinal stenosis   . Chronic osteomyelitis ([place])   . CKD (chronic kidney disease)   . CKD (chronic kidney disease)   . Clostridium difficile diarrhea   . Depression   . Diplopia   . Fatigue   . GERD (gastroesophageal reflux disease)   . Hyperlipemia   . Hypertension   . Hypovitaminosis D   . IBS (irritable bowel syndrome)   . 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 Insomnia   . Left lumbosacral radiculopathy   . Moderate obstructive sleep apnea   . Osteomyelitis of foot ([place])   . PAD (peripheral artery disease) ([place])   . Pruritus   . RA (rheumatoid arthritis) ([place])   . Radiculopathy   . Restless legs syndrome   . RLS (restless legs syndrome)   . SLE (systemic lupus erythematosus related syndrome) ([place])   . 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 Stroke ([place])   . Toxic maculopathy of both eyes      Past Surgical History:  Procedure Laterality Date  . CARDIAC CATHETERIZATION    . CAROTID ENDARTERECTOMY    . CERVICAL LAMINECTOMY    . CORONARY ANGIOPLASTY    . FRACTURE SURGERY    . fusion C5-6-7    . HERNIA REPAIR    . KNEE ARTHROSCOPY    . TONSILLECTOMY      There were no vitals filed for this visit.   Subjective Assessment - 08/06/19 1320    Subjective Patient reports increased fatigue today; No pain;    Patient is accompained by: Family member    Pertinent History Patient is a 74 y.o. male who presents to outpatient physical therapy with a referral for medical diagnosis of CVA. This patient's chief complaints consist of left hemiplegia and overall deconditioning leading to the following functional deficits: dependent for ADLs, IADLs, unable to transfer to car, dependent transfers at home with hoyer lift, unable to walk or stand without significant assistance, difficulty with W/C navigation..    Limitations Sitting;Lifting;Standing;House hold activities;Writing;Walking;Other (comment)    Pain Onset More than a month ago              TREATMENT: Prior to transfersand in between transfers: Patient sittingin wheelchair: -hip flexion marchx10reps bilaterally -  green tband hip abduction/ER x15 reps with visual cues to increase LLE AROM;  Reinforced HEP with instruction to focus on isolating one leg at a time to help with disassociation;   Ptrequires verbal and visual cues to improve ROM;He was able to exhibit improved LLE AROM with cues; Patient also requires cues for erect posture for better trunk control;  Following exercise: Transferred sit<>Stand in parallel bars (seat adjusted to 27 inch from floor height) x9reps throughout session;He was able to transferCGA to supervision, herequiredmodVCs for forward weight shift for better trunk control and transfer ability;Patient does exhibit increased lateral lean to left side this session;  Standing in  parallel bars: Standing,min Awith min VCs for weight shift and increased extension for better stance control; -lateral weight shift side/side with hands on therapist shoulders x5 reps with min A for balance/safety;  -Mini squatx15repswith CGA and min VCs for weight shift and to increase LE muscle activation; -heel lifts with cues for knee extension for better stance control x10 reps;    Patient initially exhibits flexed posture with decreased hip extension;he also exhibits heavy lean toleftside requiring instruction for weight shift to neutral position;    Patient requiredmodA to scoot back in chair at end of session due to increased fatigue  Response to treatment: Toleratedfair. Patient heavily fatigued being unable to tolerate standing for significant length of time, often sitting back in chair. He required increased cues for weight shift and positioning. Patient tolerated LE exercise well with good LE muscle activation. He is able to lift LLE off leg rest easier when moving it first compared to after RLE;  Denies any pain at end of session;                         PT Education - 08/06/19 1320    Education Details LE exercise, strengthening, transfers, HEP    Person(s) Educated Patient    Methods Explanation;Verbal cues    Comprehension Verbalized understanding;Returned demonstration;Verbal cues required;Need further instruction            PT Short Term Goals - 05/28/19 0717      PT SHORT TERM GOAL #1   Title Be independent with initial home exercise program for self-management of symptoms.    Baseline HEP to be given at second session; advice to practice unsupported sitting at home (06/19/2018); 07/08/18: Performing at home, 6/29: needs assistance but is doing exercise program regularly; 08/22/18: adherent;    Time 2    Period Weeks    Status Achieved    Target Date 06/26/18             PT Long Term Goals - 07/28/19 1301      PT  LONG TERM GOAL #1   Title Patient will complete all bed mobility with min A to improve functional independence for getting in and out of bed and adjusting in bed.     Baseline max A - total A reported by family (06/19/2018); 07/08/18: still requires maxA, dtr reports improvement in rolling since starting therapy; 6/23: min A to supervision in clinic; not doing at home right; 8/5: Pt's daughter states that his rolling has improved, but still has difficulty with all other bed mobility; 10/23/18: pts daughter states that she is doing about 35% of the work if he is in proper position, he does uses the bed rails to help roll;  using Hoyer for coming to sit EOB; 10/12: pts daughter states that she is doing  about 30% of the work if he is in proper position, he does uses the bed rails to help roll, but it is improving;  using Hoyer for coming to sit EOB, 10/29: min A for sit to sidelying, rolling supervision, able to transition sidelying to sit with HHA;    Time 12    Period Weeks    Status Achieved      PT LONG TERM GOAL #2   Title Patient will complete sit <> stand transfer chair to chair with Min A and LRAD to improve functional independence for household and community mobility.     Baseline supervision for STS, still requires elevated surface height to 27"    Time 12    Period Weeks    Status Achieved      PT LONG TERM GOAL #3   Title Patient will navigate power w/c with min A x 100 feet to improve mobility for household and short community distances.    Baseline required total A (06/12/2018); able to roll 20 feet with min A (06/20/2018); 07/08/18: Pt can go approximately 10' without assist per family;  10/23/2018: pt's daughter states that his power WC mobility has become 100% better with the new hand control, slowed speed, and with practice; 10/12: pt's daughter reports that he can perform household WC mobility and limited community ambulation with minA    Time 12    Period Weeks    Status Achieved      PT  LONG TERM GOAL #4   Title Patient will complete car transfers W/C to family SUV with min A using LRAD to improve his abilty to participate in community activities.     Baseline unable to complete car transfers at all (06/12/2018); unable to complete car transfers at all (06/19/2018).; 07/08/18: Unable to perform at this time; 09/11/18: unable to perform at this time; 10/23/2018: unable to complete car transfers at all; 10/12:  unable to complete car transfers at all, 10/29: unable; 01/13/19: unable; 2/11: min-mod A +2, 3/10: min A+1    Time 12    Period Weeks    Status Achieved      PT LONG TERM GOAL #5   Title Patient will ambulate at least 10 feet with min A using LRAD to improve mobility for household and community distances.    Baseline unable to take steps (06/12/2018); able to shift R leg forward and back with max A and RW at edge of plinth (06/19/2018); 07/08/18: unable to ambulate at this time. 09/11/18: unable to perform at this time; 10/23/2018: unable to perform at this time; 10/12: unable to perform at this time, 10/29: unable at this time; 01/13/19: unable at this time, 2/11: min A to step with RLE, unable to step with LLE, 3/10: able to take 2 steps in parallel bars with min A for safety; 4/1: min A for walking 10 feet in parallel bars, 5/24: min A walking 10 feet in parallel bars; 6/21: no change    Time 12    Period Weeks    Status Partially Met    Target Date 08/20/19      PT LONG TERM GOAL #6   Title Patient will be able to safety navigate up/down ramp with power chair, exhibiting good safety awareness, mod I to safely enter/exit his home.    Baseline 2/11: supervision with intermittent min A;4/1: supervision; 5/24: supervision    Time 12    Period Weeks    Status Partially Met    Target Date  08/20/19      PT LONG TERM GOAL #7   Title Patient will increase functional reach test to >15 inches in sitting to exhibit improved sitting balance and positioning and reduce fall risk;    Baseline  07/30/18: 12 inches; 09/11/18: 10-12 inches; 10/23/18: 12-13 inch; 10/12: 14.5", 10/29: 15.5 inch    Time 4    Period Weeks    Status Achieved    Target Date 08/20/19      PT LONG TERM GOAL #8   Title Patient will increase LLE quad strength to 3+/5 to improve functional strength for standing and mobility;    Baseline 10/28: 2+/5; 01/13/19: 2+/5, 2/11: 3-/5, 04/16/19: 3-/5, 05/08/19: 3-/5, 5/24: 3-/5, 6/21: 3-/5    Time 12    Period Weeks    Status Partially Met    Target Date 08/20/19      PT LONG TERM GOAL  #9   TITLE Patient will report no vertigo with provoking motions or positions in order to be able to perform ADLs and mobility tasks without symptoms.    Baseline 06/30/19: reports no dizziness in last week; 6/21: one episode this weekend;    Time 12    Period Weeks    Status Partially Met    Target Date 08/20/19                 Plan - 08/06/19 1446    Clinical Impression Statement Patient motivated but more fatigued today. He does report having episode yesterday of being distracted and distant (likely possible mild seizure) which could contribute to fatigue. patient instructed in transfers. He does exhibit increased lean to left side with decreased trunk control. He was able to exhibit good initial forward lean but has difficulty achieving hip extension often falling posteriorly. Patient unable to stand for significant amount of time this session due to LE fatigue. He was instructed in seated LE strengthening exercise. He does require verbal/visual cues for proper positioning. Patient would benefit from additional skilled PT Intervention to improve strength, balance and mobility;    Personal Factors and Comorbidities Age;Comorbidity 3+    Comorbidities Relevant past medical history and comorbidities include long term steroid use for lupus, CKD, chronic osteomyelitis, cervical spine stenosis, BPH, APS, alcohol abuse, peripheral artery disease, depression, GERD, obstructive sleep apnea,  HTN, IBS, IgA deficiency, lumbar radiculopathy, RA, Stroke, toxic maculopathy in both eyes, systemic lupus erythematosus related syndrome, cardiac catheterization, carotid endarterectomy, cervical laminectomy, coronary angioplasty, Fusion C5-C7, knee arthroplasty, lumbar surgery.    Examination-Activity Limitations Bed Mobility;Bend;Sit;Toileting;Stand;Stairs;Lift;Transfers;Squat;Locomotion Level;Carry;Dressing;Hygiene/Grooming;Continence    Examination-Participation Restrictions Yard Work;Interpersonal Relationship;Community Activity    Stability/Clinical Decision Making Evolving/Moderate complexity    Rehab Potential Fair    PT Frequency 3x / week    PT Duration 12 weeks    PT Treatment/Interventions ADLs/Self Care Home Management;Electrical Stimulation;Moist Heat;Cryotherapy;Gait training;Stair training;Functional mobility training;Therapeutic exercise;Balance training;Neuromuscular re-education;Cognitive remediation;Patient/family education;Orthotic Fit/Training;Wheelchair mobility training;Manual techniques;Passive range of motion;Energy conservation;Joint Manipulations;Canalith Repostioning;Therapeutic activities;Vestibular    PT Next Visit Plan Work on strength, sitting/standing balance, functional activities such as rolling, bed mobility, transfers; caregiver training    PT Home Exercise Plan no updates this session    Consulted and Agree with Plan of Care Patient;Family member/caregiver    Family Member Consulted Wife           Patient will benefit from skilled therapeutic intervention in order to improve the following deficits and impairments:  Abnormal gait, Decreased activity tolerance, Decreased cognition, Decreased endurance, Decreased knowledge of use of DME, Decreased  range of motion, Decreased skin integrity, Decreased strength, Impaired perceived functional ability, Impaired sensation, Impaired UE functional use, Improper body mechanics, Pain, Cardiopulmonary status limiting  activity, Decreased balance, Decreased coordination, Decreased mobility, Difficulty walking, Impaired tone, Postural dysfunction, Dizziness  Visit Diagnosis: Muscle weakness (generalized)  Other lack of coordination  Difficulty in walking, not elsewhere classified  Repeated falls     Problem List Patient Active Problem List   Diagnosis Date Noted  . Sepsis ([place]) 10/18/2018    Kymberly Blomberg PT, DPT 08/06/2019, 2:48 PM  [place] MAIN [facility] SERVICES [address] [place], [place], [address] Phone: [phone]   Fax:  [phone]  Name: Kenneth Summers MRN: [id number] Date of Birth: 01/14/1946

## 2019-08-07 ENCOUNTER — Ambulatory Visit: Payer: Medicare Other | Attending: Family Medicine | Admitting: Occupational Therapy

## 2019-08-07 ENCOUNTER — Encounter: Payer: Self-pay | Admitting: Occupational Therapy

## 2019-08-07 ENCOUNTER — Encounter: Payer: Self-pay | Admitting: Physical Therapy

## 2019-08-07 ENCOUNTER — Other Ambulatory Visit: Payer: Self-pay

## 2019-08-07 ENCOUNTER — Ambulatory Visit: Payer: Medicare Other | Admitting: Physical Therapy

## 2019-08-07 DIAGNOSIS — R296 Repeated falls: Secondary | ICD-10-CM | POA: Insufficient documentation

## 2019-08-07 DIAGNOSIS — R262 Difficulty in walking, not elsewhere classified: Secondary | ICD-10-CM

## 2019-08-07 DIAGNOSIS — M6281 Muscle weakness (generalized): Secondary | ICD-10-CM

## 2019-08-07 DIAGNOSIS — R278 Other lack of coordination: Secondary | ICD-10-CM | POA: Diagnosis present

## 2019-08-07 NOTE — Therapy (Signed)
[facility] MAIN [facility] SERVICES [address] [place], [place], [address] Phone: [phone]   Fax:  [phone]  Occupational Therapy Treatment  Patient Details  Name: Kenneth Summers MRN: [id number] Date of Birth: 01-26-1946 No data recorded  Encounter Date: 08/07/2019   OT End of Session - 08/07/19 1312    Visit Number 125    Number of Visits 157    Date for OT Re-Evaluation 09/15/19    Authorization Type Progress report period starting 07/21/2019    OT Start Time 1300    OT Stop Time 1345    OT Time Calculation (min) 45 min    Activity Tolerance Patient tolerated treatment well    Behavior During Therapy [facility] for tasks assessed/performed           Past Medical History:  Diagnosis Date  . Actinic keratosis   . Alcohol abuse   . APS (antiphospholipid syndrome) (HCC)   . Basal cell carcinoma   . BPH (benign prostatic hyperplasia)   . CAD (coronary artery disease)   . Cellulitis   . Cervical spinal stenosis   . Chronic osteomyelitis (HCC)   . CKD (chronic kidney disease)   . CKD (chronic kidney disease)   . Clostridium difficile diarrhea   . Depression   . Diplopia   . Fatigue   . GERD (gastroesophageal reflux disease)   . Hyperlipemia   . Hypertension   . Hypovitaminosis D   . IBS (irritable bowel syndrome)   . IgA deficiency (HCC)   . Insomnia   . Left lumbosacral radiculopathy   . Moderate obstructive sleep apnea   . Osteomyelitis of foot (HCC)   . PAD (peripheral artery disease) (HCC)   . Pruritus   . RA (rheumatoid arthritis) (HCC)   . Radiculopathy   . Restless legs syndrome   . RLS (restless legs syndrome)   . SLE (systemic lupus erythematosus related syndrome) (HCC)   . 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 Stroke (HCC)   . Toxic maculopathy of both eyes     Past Surgical History:  Procedure Laterality Date  . CARDIAC CATHETERIZATION    . CAROTID ENDARTERECTOMY    .  CERVICAL LAMINECTOMY    . CORONARY ANGIOPLASTY    . FRACTURE SURGERY    . fusion C5-6-7    . HERNIA REPAIR    . KNEE ARTHROSCOPY    . TONSILLECTOMY      There were no vitals filed for this visit.   Subjective Assessment - 08/07/19 1311    Subjective  Pt. reports that he is feeling good overall    Patient is accompanied by: Family member    Pertinent History Pt. is a 74 y.o. 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 Pt. PMHx includes: Multiple Falls, Lupus, DJD, Renal Abscess, CVA. Pt. resides with his wife. Pt.'s wife and daughter assist with ADLs. Pt. has caregivers in  for 2 hours a day, 6 days a week. Pt. received Rehab services in acute care, at SNF for STR, and HHOT services. Pt. is retired from [employer] for [employer] and [employer].    Patient Stated Goals To regain use of his left UE, and do more for himself.    Currently in Pain? No/denies          OTTREATMENT   Therapeutic Activities:  Pt. worked onusing his left hand forgrasping,and placing 2"large  pegs on the Instructo board placed at a tabletop surface. Pt.worked on following [employer] patterns. Pt. requiredmore cuing to follow the design pattern, use compensatory strategies, and place the pegs in the accurate spot on the board for more complex design patterns. Pt. required increased time to complete a design of moderate complexity. Pt. required increased cues to use his left hand.  Pt. continues to present withimproved attention, however required assist for left sided awareness, andtask initiation only requiring cues initially. Pt. required minimalcues to use his left hand, and moderate cuesfor accuracy following complexdesign patterns. Pt. had difficulty with consistently using his left UE as well as  forward flexing at his trunk at the same time. Pt. continues to work on these skills in order to work towards increasing engagement of the LUE during ADLs, and  IADL tasks.Pt. continues to work on improving LUE functioning in order to work towards improving overall independence with ADLs, and IADLs.                         OT Education - 08/07/19 1312    Education Details pattern designs, identifying patterns and strategies for completion of design copy    Person(s) Educated Patient    Methods Explanation;Demonstration    Comprehension Returned demonstration;Tactile cues required;Verbalized understanding;Need further instruction               OT Long Term Goals - 07/23/19 0826      OT LONG TERM GOAL #1   Title Pt. will increase left shoulder flexion AROM by 10 degrees to access cabinet/shelf.    Baseline Shoulder flexion:130. Pt. is progressing, and continues to work on reaching with increasing emphasis on flexing trunk to improve functional reach.  07/21/19- shoulder flexion to 130 with effort    Time 12    Period Weeks    Status Partially Met    Target Date 09/15/19      OT LONG TERM GOAL #2   Title Pt. will donn a shirt with Supervision.    Baseline Pt. continues to require MinA donning a zip down jacket with consistent cues to initiate the correct direction of the jacket.  07/21/19 continues to require min assist at times for shirt/jacket    Time 12    Status On-going    Target Date 09/15/19      OT LONG TERM GOAL #4   Title Pt. will improve left grip strength by 10# to be able to open a jar/container.    Baseline Pt. continues to work towards opening containers, and bottles.    Time 12    Period Weeks    Status On-going    Target Date 09/15/19      OT LONG TERM GOAL #5   Title Pt. will improve left hand [facility] skills to be able to assist with buttoning/zipping.    Baseline Pt. is improving with manipulating the zipper on his jacket, however continues to require practice. Pt. continues to work on improving bilateral [facility] skills for buttoning/zipping.  07/21/19 improving with buttons and zippers.    Time 12     Period Weeks    Status On-going    Target Date 09/15/19      OT LONG TERM GOAL #6   Title Pt. will demonstrate visual conmpensatory strategies for 100% of the time during ADLs    Baseline Pt. requires  fewer cues for left sides awareness.    Time 12    Period  Weeks    Status Partially Met    Target Date 09/15/19      OT LONG TERM GOAL #8   Title Pt. will accurately identify potential safety hazard using good safety awareness, and judgement 100% for ADLs, and IADLs.    Baseline Pt. requires cues    Time 12    Period Weeks    Status On-going    Target Date 09/15/19      OT LONG TERM GOAL  #9   TITLE Pt. will  navigate his w/c around obstacles with Supervision and 100% accuracy    Baseline Pt. continues to require cuing for obstacle on the left, however requires less cues. 07/21/2019 continues to improve with decreased assist at times    Time 12    Period Weeks    Status On-going    Target Date 09/15/19      OT LONG TERM GOAL  #11   TITLE Pt. will increase BUE strength by 42m grades in preparation for ADL transfers.    Baseline , 07/21/19 continues to work towards improved strength, LUE 3-/5    Time 12    Period Weeks    Status On-going    Target Date 09/15/19      OT LONG TERM GOAL  #12   TITLE Pt. will improve LUE functional reaching with the left engaging the trunk 100% of the time during ADL tasks with minimal cues.    Baseline 07/21/2019: Pt. is improving with LUE functional reachin, howwver requires cues to engage trunk for extended reaching.    Time 12    Period Weeks    Status On-going    Target Date 09/15/19                 Plan - 08/07/19 1313    Clinical Impression Statement Pt. continues to present withimproved attention, however required assist for left sided awareness, andtask initiation only requiring cues initially. Pt. required minimalcues to use his left hand, and moderate cuesfor accuracy following complexdesign patterns. Pt. had difficulty  with consistently using his left UE as well as  forward flexing at his trunk at the same time. Pt. continues to work on these skills in order to work towards increasing engagement of the LUE during ADLs, and IADL tasks.Pt. continues to work on improving LUE functioning in order to work towards improving overall independence with ADLs, and IADLs.   OT Occupational Profile and History Problem Focused Assessment - Including review of records relating to presenting problem    Occupational performance deficits (Please refer to evaluation for details): ADL's    Body Structure / Function / Physical Skills UE functional use;Coordination;FMC;Dexterity;Strength;ROM    Rehab Potential Good    Clinical Decision Making Several treatment options, min-mod task modification necessary    Comorbidities Affecting Occupational Performance: Presence of comorbidities impacting occupational performance    Comorbidities impacting occupational performance description: Phyical, cognitive, visual,  medical comorbidities    Modification or Assistance to Complete Evaluation  No modification of tasks or assist necessary to complete eval    OT Frequency 2x / week    OT Duration 12 weeks    OT Treatment/Interventions Self-care/ADL training;DME and/or AE instruction;Therapeutic exercise;Therapeutic activities;Moist Heat;Cognitive remediation/compensation;Neuromuscular education;Visual/perceptual remediation/compensation;Coping strategies training;Patient/family education;Passive range of motion;Psychosocial skills training;Energy conservation;Functional Mobility Training    Recommended Other Services PT    Consulted and Agree with Plan of Care Patient           Patient will benefit from skilled therapeutic intervention in  order to improve the following deficits and impairments:   Body Structure / Function / Physical Skills: UE functional use, Coordination, FMC, Dexterity, Strength, ROM       Visit Diagnosis: Muscle  weakness (generalized)  Other lack of coordination    Problem List Patient Active Problem List   Diagnosis Date Noted  . Sepsis ([place]) 10/18/2018    Kenneth Carina, MS, OTR/L 08/07/2019, 1:24 PM  [place] MAIN [facility] SERVICES [address] [place], [place], [address] Phone: [phone]   Fax:  [phone]  Name: Kenneth Summers MRN: [id number] Date of Birth: 11-07-1945

## 2019-08-07 NOTE — Therapy (Signed)
[place] MAIN [facility] SERVICES [address] [place], [place], [address] Phone: [phone]   Fax:  [phone]  Physical Therapy Treatment  Patient Details  Name: Kenneth Summers MRN: [id number] Date of Birth: Feb 20, 1945 No data recorded  Encounter Date: 08/07/2019   PT End of Session - 08/07/19 1344    Visit Number 155    Number of Visits 180    Date for PT Re-Evaluation 08/20/19    Authorization Type Medicare reporting period starting 09/11/18    Authorization Time Period goals last assessed 05/26/19    PT Start Time 1345    PT Stop Time 1430    PT Time Calculation (min) 45 min    Equipment Utilized During Treatment Gait belt    Activity Tolerance Patient tolerated treatment well;No increased pain    Behavior During Therapy WFL for tasks assessed/performed           Past Medical History:  Diagnosis Date  . Actinic keratosis   . Alcohol abuse   . APS (antiphospholipid syndrome) ([place])   . Basal cell carcinoma   . BPH (benign prostatic hyperplasia)   . CAD (coronary artery disease)   . Cellulitis   . Cervical spinal stenosis   . Chronic osteomyelitis ([place])   . CKD (chronic kidney disease)   . CKD (chronic kidney disease)   . Clostridium difficile diarrhea   . Depression   . Diplopia   . Fatigue   . GERD (gastroesophageal reflux disease)   . Hyperlipemia   . Hypertension   . Hypovitaminosis D   . IBS (irritable bowel syndrome)   . 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 Insomnia   . Left lumbosacral radiculopathy   . Moderate obstructive sleep apnea   . Osteomyelitis of foot ([place])   . PAD (peripheral artery disease) ([place])   . Pruritus   . RA (rheumatoid arthritis) ([place])   . Radiculopathy   . Restless legs syndrome   . RLS (restless legs syndrome)   . SLE (systemic lupus erythematosus related syndrome) ([place])   . 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 Stroke ([place])   . Toxic maculopathy of both eyes      Past Surgical History:  Procedure Laterality Date  . CARDIAC CATHETERIZATION    . CAROTID ENDARTERECTOMY    . CERVICAL LAMINECTOMY    . CORONARY ANGIOPLASTY    . FRACTURE SURGERY    . fusion C5-6-7    . HERNIA REPAIR    . KNEE ARTHROSCOPY    . TONSILLECTOMY      There were no vitals filed for this visit.   Subjective Assessment - 08/07/19 1345    Subjective Patient reports doing well; no pain but reports increased fatigue; no new falls no other changes;    Patient is accompained by: Family member    Pertinent History Patient is a 74 y.o. male who presents to outpatient physical therapy with a referral for medical diagnosis of CVA. This patient's chief complaints consist of left hemiplegia and overall deconditioning leading to the following functional deficits: dependent for ADLs, IADLs, unable to transfer to car, dependent transfers at home with hoyer lift, unable to walk or stand without significant assistance, difficulty with W/C navigation..    Limitations Sitting;Lifting;Standing;House hold activities;Writing;Walking;Other (comment)    Currently in Pain? No/denies    Pain Onset More than a month ago    Multiple Pain Sites No  TREATMENT: Prior to transfersand in between transfers: Patient sittingin wheelchair: -unsupported sitting head turns side/side x5 reps with cues for erect posture -deep breathing x5 reps; Reinforced HEP with instruction to focus on isolating one leg at a time to help with disassociation;   Ptrequires verbal and visual cues to improve ROM;He was able to exhibit improved LLE AROM with cues; Patient also requires cues for erect posture for better trunk control;  Following exercise: Transferred sit<>Stand in parallel bars (seat adjusted to 27 inch from floor height) x5 reps throughout session;He was able to transferCGA to supervision, herequiredmodVCs for forward weight shift for better trunk control and transfer  ability;Patient does exhibit increased lateral lean to left side this session;  Standing in parallel bars: Standing,min Awith min VCs for weight shift and increased extension for better stance control; -lateral weight shift side/side with hands on therapist shoulders x5 reps with min A for balance/safety;  -Mini [id number] CGA and min VCs for weight shift and to increase LE muscle activation; -weight shift to neutral, stepping with right foot, staggered stance, forward/backward weight shift 2x5;  -Toe taps on 6 inch step x3 repsLLE;Pt able to exhibit better weight shift to RLE with improved LLE hip flexion being able to actually tap top of step;    Patient initially exhibits flexed posture with decreased hip extension;he also exhibits heavy lean toleftside requiring instruction for weight shift to neutral position;    Patient requiredmodA to scoot back in chair at end of session due to increased fatigue  Educated patient and caregiver in ways to scoot back in power chair. Caregiver required mod VCs for proper positioning for better ease;   Response to treatment: [place]. Patient was able to exhibit better weight shift this session with improved weight bearing in BLE. He does fatigue quickly but was able to do increased activities/exercise with fewer rest breaks.                         PT Education - 08/07/19 1344    Education Details LE strengthening, transfers, HEP    Person(s) Educated Patient    Methods Explanation;Verbal cues    Comprehension Verbalized understanding;Returned demonstration;Verbal cues required;Need further instruction            PT Short Term Goals - 05/28/19 0717      PT SHORT TERM GOAL #1   Title Be independent with initial home exercise program for self-management of symptoms.    Baseline HEP to be given at second session; advice to practice unsupported sitting at home (06/19/2018);  07/08/18: Performing at home, 6/29: needs assistance but is doing exercise program regularly; 08/22/18: adherent;    Time 2    Period Weeks    Status Achieved    Target Date 06/26/18             PT Long Term Goals - 07/28/19 1301      PT LONG TERM GOAL #1   Title Patient will complete all bed mobility with min A to improve functional independence for getting in and out of bed and adjusting in bed.     Baseline max A - total A reported by family (06/19/2018); 07/08/18: still requires maxA, dtr reports improvement in rolling since starting therapy; 6/23: min A to supervision in clinic; not doing at home right; 8/5: Pt's daughter states that his rolling has improved, but still has difficulty with all other bed mobility; 10/23/18: pts daughter states that she is doing about  35% of the work if he is in proper position, he does uses the bed rails to help roll;  using Harrel Lemon for coming to sit EOB; 10/12: pts daughter states that she is doing about 30% of the work if he is in proper position, he does uses the bed rails to help roll, but it is improving;  using Hoyer for coming to sit EOB, 10/29: min A for sit to sidelying, rolling supervision, able to transition sidelying to sit with HHA;    Time 12    Period Weeks    Status Achieved      PT LONG TERM GOAL #2   Title Patient will complete sit <> stand transfer chair to chair with Min A and LRAD to improve functional independence for household and community mobility.     Baseline supervision for STS, still requires elevated surface height to 27"    Time 12    Period Weeks    Status Achieved      PT LONG TERM GOAL #3   Title Patient will navigate power w/c with min A x 100 feet to improve mobility for household and short community distances.    Baseline required total A (06/12/2018); able to roll 20 feet with min A (06/20/2018); 07/08/18: Pt can go approximately 10' without assist per family;  10/23/2018: pt's daughter states that his power WC mobility has  become 100% better with the new hand control, slowed speed, and with practice; 10/12: pt's daughter reports that he can perform household WC mobility and limited community ambulation with minA    Time 12    Period Weeks    Status Achieved      PT LONG TERM GOAL #4   Title Patient will complete car transfers W/C to family SUV with min A using LRAD to improve his abilty to participate in community activities.     Baseline unable to complete car transfers at all (06/12/2018); unable to complete car transfers at all (06/19/2018).; 07/08/18: Unable to perform at this time; 09/11/18: unable to perform at this time; 10/23/2018: unable to complete car transfers at all; 10/12:  unable to complete car transfers at all, 10/29: unable; 01/13/19: unable; 2/11: min-mod A +2, 3/10: min A+1    Time 12    Period Weeks    Status Achieved      PT LONG TERM GOAL #5   Title Patient will ambulate at least 10 feet with min A using LRAD to improve mobility for household and community distances.    Baseline unable to take steps (06/12/2018); able to shift R leg forward and back with max A and RW at edge of plinth (06/19/2018); 07/08/18: unable to ambulate at this time. 09/11/18: unable to perform at this time; 10/23/2018: unable to perform at this time; 10/12: unable to perform at this time, 10/29: unable at this time; 01/13/19: unable at this time, 2/11: min A to step with RLE, unable to step with LLE, 3/10: able to take 2 steps in parallel bars with min A for safety; 4/1: min A for walking 10 feet in parallel bars, 5/24: min A walking 10 feet in parallel bars; 6/21: no change    Time 12    Period Weeks    Status Partially Met    Target Date 08/20/19      PT LONG TERM GOAL #6   Title Patient will be able to safety navigate up/down ramp with power chair, exhibiting good safety awareness, mod I to safely enter/exit his  home.    Baseline 2/11: supervision with intermittent min A;4/1: supervision; 5/24: supervision    Time 12    Period  Weeks    Status Partially Met    Target Date 08/20/19      PT LONG TERM GOAL #7   Title Patient will increase functional reach test to >15 inches in sitting to exhibit improved sitting balance and positioning and reduce fall risk;    Baseline 07/30/18: 12 inches; 09/11/18: 10-12 inches; 10/23/18: 12-13 inch; 10/12: 14.5", 10/29: 15.5 inch    Time 4    Period Weeks    Status Achieved    Target Date 08/20/19      PT LONG TERM GOAL #8   Title Patient will increase LLE quad strength to 3+/5 to improve functional strength for standing and mobility;    Baseline 10/28: 2+/5; 01/13/19: 2+/5, 2/11: 3-/5, 04/16/19: 3-/5, 05/08/19: 3-/5, 5/24: 3-/5, 6/21: 3-/5    Time 12    Period Weeks    Status Partially Met    Target Date 08/20/19      PT LONG TERM GOAL  #9   TITLE Patient will report no vertigo with provoking motions or positions in order to be able to perform ADLs and mobility tasks without symptoms.    Baseline 06/30/19: reports no dizziness in last week; 6/21: one episode this weekend;    Time 12    Period Weeks    Status Partially Met    Target Date 08/20/19                 Plan - 08/07/19 1455    Clinical Impression Statement Patient motivated and participated well within session. He was able to exhibit better transfers this session progressing to supervision. he does require cues for erect posture including to improve hip extension in stance. Progressed standing activities with forward/backward weight shift in staggered stance to improve weight shift. He was able to progress with improved hip extension with forward weight shift to RLE. Patient also able to exhibit better LLE hip flexion this session being able to tap toe to 4 inch step. Patient does fatigue quickly requiring short rest breaks. Patient would benefit from additional skilled PT Intervention to improve strength, balance and mobility;    Personal Factors and Comorbidities Age;Comorbidity 3+    Comorbidities Relevant past  medical history and comorbidities include long term steroid use for lupus, CKD, chronic osteomyelitis, cervical spine stenosis, BPH, APS, alcohol abuse, peripheral artery disease, depression, GERD, obstructive sleep apnea, HTN, IBS, IgA deficiency, lumbar radiculopathy, RA, Stroke, toxic maculopathy in both eyes, systemic lupus erythematosus related syndrome, cardiac catheterization, carotid endarterectomy, cervical laminectomy, coronary angioplasty, Fusion C5-C7, knee arthroplasty, lumbar surgery.    Examination-Activity Limitations Bed Mobility;Bend;Sit;Toileting;Stand;Stairs;Lift;Transfers;Squat;Locomotion Level;Carry;Dressing;Hygiene/Grooming;Continence    Examination-Participation Restrictions Yard Work;Interpersonal Relationship;Community Activity    Stability/Clinical Decision Making Evolving/Moderate complexity    Rehab Potential Fair    PT Frequency 3x / week    PT Duration 12 weeks    PT Treatment/Interventions ADLs/Self Care Home Management;Electrical Stimulation;Moist Heat;Cryotherapy;Gait training;Stair training;Functional mobility training;Therapeutic exercise;Balance training;Neuromuscular re-education;Cognitive remediation;Patient/family education;Orthotic Fit/Training;Wheelchair mobility training;Manual techniques;Passive range of motion;Energy conservation;Joint Manipulations;Canalith Repostioning;Therapeutic activities;Vestibular    PT Next Visit Plan Work on strength, sitting/standing balance, functional activities such as rolling, bed mobility, transfers; caregiver training    PT Home Exercise Plan no updates this session    Consulted and Agree with Plan of Care Patient;Family member/caregiver    Family Member Consulted Wife  Patient will benefit from skilled therapeutic intervention in order to improve the following deficits and impairments:  Abnormal gait, Decreased activity tolerance, Decreased cognition, Decreased endurance, Decreased knowledge of use of DME,  Decreased range of motion, Decreased skin integrity, Decreased strength, Impaired perceived functional ability, Impaired sensation, Impaired UE functional use, Improper body mechanics, Pain, Cardiopulmonary status limiting activity, Decreased balance, Decreased coordination, Decreased mobility, Difficulty walking, Impaired tone, Postural dysfunction, Dizziness  Visit Diagnosis: Muscle weakness (generalized)  Other lack of coordination  Difficulty in walking, not elsewhere classified  Repeated falls     Problem List Patient Active Problem List   Diagnosis Date Noted  . Sepsis ([place]) 10/18/2018    Jigar Zielke PT, DPT 08/07/2019, 2:57 PM  [place] MAIN [facility] SERVICES [address] [place], [place], [address] Phone: [phone]   Fax:  [phone]  Name: Kenneth Summers MRN: [id number] Date of Birth: [birth year]

## 2019-08-13 ENCOUNTER — Ambulatory Visit: Payer: Medicare Other | Admitting: Occupational Therapy

## 2019-08-13 ENCOUNTER — Other Ambulatory Visit: Payer: Self-pay

## 2019-08-13 ENCOUNTER — Ambulatory Visit: Payer: Medicare Other | Admitting: Physical Therapy

## 2019-08-13 DIAGNOSIS — M6281 Muscle weakness (generalized): Secondary | ICD-10-CM

## 2019-08-13 DIAGNOSIS — R262 Difficulty in walking, not elsewhere classified: Secondary | ICD-10-CM

## 2019-08-13 DIAGNOSIS — R278 Other lack of coordination: Secondary | ICD-10-CM

## 2019-08-13 DIAGNOSIS — R296 Repeated falls: Secondary | ICD-10-CM

## 2019-08-13 NOTE — Therapy (Signed)
[place] MAIN [facility] SERVICES [address] [place], [place], [address] Phone: [phone]   Fax:  [phone]  Patient Details  Name: Kenneth Summers MRN: [id number] Date of Birth: 1945-08-17 Referring Provider:  Valera Castle, *  Encounter Date: 08/13/2019  Pt. arrived to therapy today, however reports having had diarrhea, with an episode duirng PT. OT services were withheld today due to stomach/bowel issues.  Harrel Carina, MS, OTR/L 08/13/2019, 2:05 PM  [place] MAIN [facility] SERVICES [address] [place], [place], [address] Phone: [phone]   Fax:  [phone]

## 2019-08-14 ENCOUNTER — Ambulatory Visit: Payer: Medicare Other | Admitting: Occupational Therapy

## 2019-08-14 ENCOUNTER — Encounter: Payer: Self-pay | Admitting: Physical Therapy

## 2019-08-14 ENCOUNTER — Ambulatory Visit: Payer: Medicare Other | Admitting: Physical Therapy

## 2019-08-14 NOTE — Therapy (Signed)
[place] MAIN [facility] SERVICES [address] [place], [place], [address] Phone: [phone]   Fax:  [phone]  Physical Therapy Treatment  Patient Details  Name: Kenneth Summers MRN: [id number] Date of Birth: 28-Aug-1945 No data recorded  Encounter Date: 08/13/2019   PT End of Session - 08/14/19 0952    Visit Number 156    Number of Visits 180    Date for PT Re-Evaluation 08/20/19    Authorization Type Medicare reporting period starting 09/11/18    Authorization Time Period goals last assessed 05/26/19    PT Start Time 1302    PT Stop Time 1345    PT Time Calculation (min) 43 min    Equipment Utilized During Treatment Gait belt    Activity Tolerance Patient tolerated treatment well;No increased pain    Behavior During Therapy WFL for tasks assessed/performed           Past Medical History:  Diagnosis Date  . Actinic keratosis   . Alcohol abuse   . APS (antiphospholipid syndrome) ([place])   . Basal cell carcinoma   . BPH (benign prostatic hyperplasia)   . CAD (coronary artery disease)   . Cellulitis   . Cervical spinal stenosis   . Chronic osteomyelitis ([place])   . CKD (chronic kidney disease)   . CKD (chronic kidney disease)   . Clostridium difficile diarrhea   . Depression   . Diplopia   . Fatigue   . GERD (gastroesophageal reflux disease)   . Hyperlipemia   . Hypertension   . Hypovitaminosis D   . IBS (irritable bowel syndrome)   . 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 Insomnia   . Left lumbosacral radiculopathy   . Moderate obstructive sleep apnea   . Osteomyelitis of foot ([place])   . PAD (peripheral artery disease) ([place])   . Pruritus   . RA (rheumatoid arthritis) ([place])   . Radiculopathy   . Restless legs syndrome   . RLS (restless legs syndrome)   . SLE (systemic lupus erythematosus related syndrome) ([place])   . 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 Stroke ([place])   . Toxic maculopathy of both eyes      Past Surgical History:  Procedure Laterality Date  . CARDIAC CATHETERIZATION    . CAROTID ENDARTERECTOMY    . CERVICAL LAMINECTOMY    . CORONARY ANGIOPLASTY    . FRACTURE SURGERY    . fusion C5-6-7    . HERNIA REPAIR    . KNEE ARTHROSCOPY    . TONSILLECTOMY      There were no vitals filed for this visit.   Subjective Assessment - 08/14/19 0951    Subjective Patient presents to therapy reporting that he has had an accident and needs to get his undergarments changed; Deneis any pain;    Patient is accompained by: Family member    Pertinent History Patient is a 74 y.o. male who presents to outpatient physical therapy with a referral for medical diagnosis of CVA. This patient's chief complaints consist of left hemiplegia and overall deconditioning leading to the following functional deficits: dependent for ADLs, IADLs, unable to transfer to car, dependent transfers at home with hoyer lift, unable to walk or stand without significant assistance, difficulty with W/C navigation..    Limitations Sitting;Lifting;Standing;House hold activities;Writing;Walking;Other (comment)    Currently in Pain? No/denies    Pain Onset More than a month ago    Multiple Pain Sites  No           TREATMENT:  Patient required mod A +2 for squat pivot transfer wheelchair to mat table;  He then required min A +2 for transitioning sit to supine;   Patient required min A +2 for rolling side/side while getting undergarments changed;  He does report increased soreness on bottom from rash. Caregiver reports they are putting medication on rash but its still uncomfortable;  Patient required mod A +1 for transition sidelying to sitting edge of mat table;  Sitting edge of mat table: Patient required mod VCs for erect posture with cues for anterior pelvic rock and shoulder extension; He does exhibit increased left lateral trunk lean (likely from fatigue) requiring visual and verbal cues for reorientation to  midline; Sitting with arms in lap, min A for balance:  Alternate UE lift x5 reps each with mod Vcs for trunk positioning to midline and to increase AROM to tolerance;  While sitting edge of mat table he started falling backwards requiring mod A to pull up to sitting position. Upon sitting he reports feeling dizzy. Nystagmus noted in bilateral eyes which lasted approximately 30 sec. Unsure of etiology. Patient has reported feeling dizzy before. He has had dix hallpike testing to assess for BPPV which was negative; Will refer to neurology;  Patient did require mod A +2-3 to transfer mat table to wheelchair with mod VCs for hand placement and trunk position;  Patient tolerated session fair. He was heavily fatigued by end of session. Patient would benefit from additional skilled PT Intervention to improve strength, balance and mobility;                           PT Education - 08/14/19 0952    Education Details transfers, positioning;    Person(s) Educated Patient    Methods Explanation;Verbal cues    Comprehension Verbalized understanding;Returned demonstration;Verbal cues required;Need further instruction            PT Short Term Goals - 05/28/19 0717      PT SHORT TERM GOAL #1   Title Be independent with initial home exercise program for self-management of symptoms.    Baseline HEP to be given at second session; advice to practice unsupported sitting at home (06/19/2018); 07/08/18: Performing at home, 6/29: needs assistance but is doing exercise program regularly; 08/22/18: adherent;    Time 2    Period Weeks    Status Achieved    Target Date 06/26/18             PT Long Term Goals - 07/28/19 1301      PT LONG TERM GOAL #1   Title Patient will complete all bed mobility with min A to improve functional independence for getting in and out of bed and adjusting in bed.     Baseline max A - total A reported by family (06/19/2018); 07/08/18: still requires maxA, dtr  reports improvement in rolling since starting therapy; 6/23: min A to supervision in clinic; not doing at home right; 8/5: Pt's daughter states that his rolling has improved, but still has difficulty with all other bed mobility; 10/23/18: pts daughter states that she is doing about 35% of the work if he is in proper position, he does uses the bed rails to help roll;  using Harrel Lemon for coming to sit EOB; 10/12: pts daughter states that she is doing about 30% of the work if he is in proper position, he  does uses the bed rails to help roll, but it is improving;  using Harrel Lemon for coming to sit EOB, 10/29: min A for sit to sidelying, rolling supervision, able to transition sidelying to sit with HHA;    Time 12    Period Weeks    Status Achieved      PT LONG TERM GOAL #2   Title Patient will complete sit <> stand transfer chair to chair with Min A and LRAD to improve functional independence for household and community mobility.     Baseline supervision for STS, still requires elevated surface height to 27"    Time 12    Period Weeks    Status Achieved      PT LONG TERM GOAL #3   Title Patient will navigate power w/c with min A x 100 feet to improve mobility for household and short community distances.    Baseline required total A (06/12/2018); able to roll 20 feet with min A (06/20/2018); 07/08/18: Pt can go approximately 10' without assist per family;  10/23/2018: pt's daughter states that his power WC mobility has become 100% better with the new hand control, slowed speed, and with practice; 10/12: pt's daughter reports that he can perform household WC mobility and limited community ambulation with minA    Time 12    Period Weeks    Status Achieved      PT LONG TERM GOAL #4   Title Patient will complete car transfers W/C to family SUV with min A using LRAD to improve his abilty to participate in community activities.     Baseline unable to complete car transfers at all (06/12/2018); unable to complete car  transfers at all (06/19/2018).; 07/08/18: Unable to perform at this time; 09/11/18: unable to perform at this time; 10/23/2018: unable to complete car transfers at all; 10/12:  unable to complete car transfers at all, 10/29: unable; 01/13/19: unable; 2/11: min-mod A +2, 3/10: min A+1    Time 12    Period Weeks    Status Achieved      PT LONG TERM GOAL #5   Title Patient will ambulate at least 10 feet with min A using LRAD to improve mobility for household and community distances.    Baseline unable to take steps (06/12/2018); able to shift R leg forward and back with max A and RW at edge of plinth (06/19/2018); 07/08/18: unable to ambulate at this time. 09/11/18: unable to perform at this time; 10/23/2018: unable to perform at this time; 10/12: unable to perform at this time, 10/29: unable at this time; 01/13/19: unable at this time, 2/11: min A to step with RLE, unable to step with LLE, 3/10: able to take 2 steps in parallel bars with min A for safety; 4/1: min A for walking 10 feet in parallel bars, 5/24: min A walking 10 feet in parallel bars; 6/21: no change    Time 12    Period Weeks    Status Partially Met    Target Date 08/20/19      PT LONG TERM GOAL #6   Title Patient will be able to safety navigate up/down ramp with power chair, exhibiting good safety awareness, mod I to safely enter/exit his home.    Baseline 2/11: supervision with intermittent min A;4/1: supervision; 5/24: supervision    Time 12    Period Weeks    Status Partially Met    Target Date 08/20/19      PT LONG TERM GOAL #7  Title Patient will increase functional reach test to >15 inches in sitting to exhibit improved sitting balance and positioning and reduce fall risk;    Baseline 07/30/18: 12 inches; 09/11/18: 10-12 inches; 10/23/18: 12-13 inch; 10/12: 14.5", 10/29: 15.5 inch    Time 4    Period Weeks    Status Achieved    Target Date 08/20/19      PT LONG TERM GOAL #8   Title Patient will increase LLE quad strength to 3+/5 to  improve functional strength for standing and mobility;    Baseline 10/28: 2+/5; 01/13/19: 2+/5, 2/11: 3-/5, 04/16/19: 3-/5, 05/08/19: 3-/5, 5/24: 3-/5, 6/21: 3-/5    Time 12    Period Weeks    Status Partially Met    Target Date 08/20/19      PT LONG TERM GOAL  #9   TITLE Patient will report no vertigo with provoking motions or positions in order to be able to perform ADLs and mobility tasks without symptoms.    Baseline 06/30/19: reports no dizziness in last week; 6/21: one episode this weekend;    Time 12    Period Weeks    Status Partially Met    Target Date 08/20/19                 Plan - 08/14/19 1610    Clinical Impression Statement Patient participated well within session. Focused on transfers/bed mobility to allow caregiver to help change patient and to educate patient/caregiver in proper positioning to ease difficulty. Patient does exhibit decreased trunk control this session likely from fatigue as well as feeling uncomfortable. Upon sitting edge of mat table he did have episode of nystagmus and reports feeling "vertigo" type feeling with room spinning. This only lasted approximately 30 sec and then was gone. Patient has been assessed for BPPV with negative dix hallpike test. Will refer back to neurologist regarding new onset dizziness symptoms. Patient would benefit from additional skilled PT intervention to improve strength, balance and mobility;    Personal Factors and Comorbidities Age;Comorbidity 3+    Comorbidities Relevant past medical history and comorbidities include long term steroid use for lupus, CKD, chronic osteomyelitis, cervical spine stenosis, BPH, APS, alcohol abuse, peripheral artery disease, depression, GERD, obstructive sleep apnea, HTN, IBS, IgA deficiency, lumbar radiculopathy, RA, Stroke, toxic maculopathy in both eyes, systemic lupus erythematosus related syndrome, cardiac catheterization, carotid endarterectomy, cervical laminectomy, coronary angioplasty,  Fusion C5-C7, knee arthroplasty, lumbar surgery.    Examination-Activity Limitations Bed Mobility;Bend;Sit;Toileting;Stand;Stairs;Lift;Transfers;Squat;Locomotion Level;Carry;Dressing;Hygiene/Grooming;Continence    Examination-Participation Restrictions Yard Work;Interpersonal Relationship;Community Activity    Stability/Clinical Decision Making Evolving/Moderate complexity    Rehab Potential Fair    PT Frequency 3x / week    PT Duration 12 weeks    PT Treatment/Interventions ADLs/Self Care Home Management;Electrical Stimulation;Moist Heat;Cryotherapy;Gait training;Stair training;Functional mobility training;Therapeutic exercise;Balance training;Neuromuscular re-education;Cognitive remediation;Patient/family education;Orthotic Fit/Training;Wheelchair mobility training;Manual techniques;Passive range of motion;Energy conservation;Joint Manipulations;Canalith Repostioning;Therapeutic activities;Vestibular    PT Next Visit Plan Work on strength, sitting/standing balance, functional activities such as rolling, bed mobility, transfers; caregiver training    PT Home Exercise Plan no updates this session    Consulted and Agree with Plan of Care Patient;Family member/caregiver    Family Member Consulted Wife           Patient will benefit from skilled therapeutic intervention in order to improve the following deficits and impairments:  Abnormal gait, Decreased activity tolerance, Decreased cognition, Decreased endurance, Decreased knowledge of use of DME, Decreased range of motion, Decreased skin integrity, Decreased strength, Impaired perceived  functional ability, Impaired sensation, Impaired UE functional use, Improper body mechanics, Pain, Cardiopulmonary status limiting activity, Decreased balance, Decreased coordination, Decreased mobility, Difficulty walking, Impaired tone, Postural dysfunction, Dizziness  Visit Diagnosis: Muscle weakness (generalized)  Other lack of coordination  Difficulty in  walking, not elsewhere classified  Repeated falls     Problem List Patient Active Problem List   Diagnosis Date Noted  . Sepsis ([place]) 10/18/2018    Abrish Erny PT, DPT 08/14/2019, 9:55 AM  [place] [facility]IN [facility] SERVICES [address] [place], [place], [address] Phone: [phone]   Fax:  [phone]  Name: SEYMOUR PAVLAK MRN: [id number] Date of Birth: February 19, 1945

## 2019-08-18 ENCOUNTER — Other Ambulatory Visit: Payer: Self-pay

## 2019-08-18 ENCOUNTER — Ambulatory Visit: Payer: Medicare Other

## 2019-08-18 ENCOUNTER — Encounter: Payer: Self-pay | Admitting: Occupational Therapy

## 2019-08-18 ENCOUNTER — Ambulatory Visit: Payer: Medicare Other | Admitting: Occupational Therapy

## 2019-08-18 DIAGNOSIS — R262 Difficulty in walking, not elsewhere classified: Secondary | ICD-10-CM

## 2019-08-18 DIAGNOSIS — M6281 Muscle weakness (generalized): Secondary | ICD-10-CM

## 2019-08-18 DIAGNOSIS — R278 Other lack of coordination: Secondary | ICD-10-CM

## 2019-08-18 NOTE — Therapy (Signed)
[place] MAIN [facility] SERVICES [address] [place], [place], [address] Phone: [phone]   Fax:  [phone]  Occupational Therapy Treatment  Patient Details  Name: Kenneth Summers MRN: [id number] Date of Birth: Dec 09, 1945 No data recorded  Encounter Date: 08/18/2019   OT End of Session - 08/18/19 1359    Visit Number 127    Number of Visits 157    Date for OT Re-Evaluation 09/15/19    Authorization Type Progress report period starting 07/21/2019    OT Start Time 1345    OT Stop Time 1430    OT Time Calculation (min) 45 min    Activity Tolerance Patient tolerated treatment well    Behavior During Therapy [facility] for tasks assessed/performed           Past Medical History:  Diagnosis Date  . Actinic keratosis   . Alcohol abuse   . APS (antiphospholipid syndrome) ([place])   . Basal cell carcinoma   . BPH (benign prostatic hyperplasia)   . CAD (coronary artery disease)   . Cellulitis   . Cervical spinal stenosis   . Chronic osteomyelitis ([place])   . CKD (chronic kidney disease)   . CKD (chronic kidney disease)   . Clostridium difficile diarrhea   . Depression   . Diplopia   . Fatigue   . GERD (gastroesophageal reflux disease)   . Hyperlipemia   . Hypertension   . Hypovitaminosis D   . IBS (irritable bowel syndrome)   . 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 Insomnia   . Left lumbosacral radiculopathy   . Moderate obstructive sleep apnea   . Osteomyelitis of foot ([place])   . PAD (peripheral artery disease) ([place])   . Pruritus   . RA (rheumatoid arthritis) ([place])   . Radiculopathy   . Restless legs syndrome   . RLS (restless legs syndrome)   . SLE (systemic lupus erythematosus related syndrome) ([place])   . 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 Stroke ([place])   . Toxic maculopathy of both eyes     Past Surgical History:  Procedure Laterality Date  . CARDIAC CATHETERIZATION    . CAROTID ENDARTERECTOMY    .  CERVICAL LAMINECTOMY    . CORONARY ANGIOPLASTY    . FRACTURE SURGERY    . fusion C5-6-7    . HERNIA REPAIR    . KNEE ARTHROSCOPY    . TONSILLECTOMY      There were no vitals filed for this visit.   Subjective Assessment - 08/18/19 1357    Subjective  Pt. reports that she is feeling good today    Patient is accompanied by: Family member    Pertinent History Pt. is a 74 y.o. 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 Pt. PMHx includes: Multiple Falls, Lupus, DJD, Renal Abscess, CVA. Pt. resides with his wife. Pt.'s wife and daughter assist with ADLs. Pt. has caregivers in  for 2 hours a day, 6 days a week. Pt. received Rehab services in acute care, at SNF for STR, and [facility] services. Pt. is retired from [employer] for [employer] and [employer].    Currently in Pain? No/denies          Therapeutic Ex:  Pt. worked on Fish farm manager, LUE awareness, and left hand [facility] skills needed to accurately complete a complex [place] design. Pt. required increased time, and verbal cues for shape  position, placement on the design board, and the use of his left hand during the task.  Pt. required increased cues to complete the [place] design pattern. Pt. began the task requiring few cues for the right side of the task, and required more cuing as the task progressed with increased cues required for the left side of the task. Pt. required step by step cues to complete with increased cues for shape position, and placement on the design pattern. Pt. Required ine reclining rest break during the task.                      OT Education - 08/18/19 1357    Education Details Design patterns, left sided awarenss.    Person(s) Educated Patient    Methods Explanation;Demonstration    Comprehension Returned demonstration;Tactile cues required;Verbalized understanding;Need further instruction               OT Long Term Goals - 07/23/19 0826       OT LONG TERM GOAL #1   Title Pt. will increase left shoulder flexion AROM by 10 degrees to access cabinet/shelf.    Baseline Shoulder flexion:130. Pt. is progressing, and continues to work on reaching with increasing emphasis on flexing trunk to improve functional reach.  07/21/19- shoulder flexion to 130 with effort    Time 12    Period Weeks    Status Partially Met    Target Date 09/15/19      OT LONG TERM GOAL #2   Title Pt. will donn a shirt with Supervision.    Baseline Pt. continues to require MinA donning a zip down jacket with consistent cues to initiate the correct direction of the jacket.  07/21/19 continues to require min assist at times for shirt/jacket    Time 12    Status On-going    Target Date 09/15/19      OT LONG TERM GOAL #4   Title Pt. will improve left grip strength by 10# to be able to open a jar/container.    Baseline Pt. continues to work towards opening containers, and bottles.    Time 12    Period Weeks    Status On-going    Target Date 09/15/19      OT LONG TERM GOAL #5   Title Pt. will improve left hand [facility] skills to be able to assist with buttoning/zipping.    Baseline Pt. is improving with manipulating the zipper on his jacket, however continues to require practice. Pt. continues to work on improving bilateral [facility] skills for buttoning/zipping.  07/21/19 improving with buttons and zippers.    Time 12    Period Weeks    Status On-going    Target Date 09/15/19      OT LONG TERM GOAL #6   Title Pt. will demonstrate visual conmpensatory strategies for 100% of the time during ADLs    Baseline Pt. requires  fewer cues for left sides awareness.    Time 12    Period Weeks    Status Partially Met    Target Date 09/15/19      OT LONG TERM GOAL #8   Title Pt. will accurately identify potential safety hazard using good safety awareness, and judgement 100% for ADLs, and IADLs.    Baseline Pt. requires cues    Time 12    Period Weeks    Status On-going     Target Date 09/15/19      OT LONG TERM GOAL  #  9   TITLE Pt. will  navigate his w/c around obstacles with Supervision and 100% accuracy    Baseline Pt. continues to require cuing for obstacle on the left, however requires less cues. 07/21/2019 continues to improve with decreased assist at times    Time 12    Period Weeks    Status On-going    Target Date 09/15/19      OT LONG TERM GOAL  #11   TITLE Pt. will increase BUE strength by 61m grades in preparation for ADL transfers.    Baseline , 07/21/19 continues to work towards improved strength, LUE 3-/5    Time 12    Period Weeks    Status On-going    Target Date 09/15/19      OT LONG TERM GOAL  #12   TITLE Pt. will improve LUE functional reaching with the left engaging the trunk 100% of the time during ADL tasks with minimal cues.    Baseline 07/21/2019: Pt. is improving with LUE functional reachin, howwver requires cues to engage trunk for extended reaching.    Time 12    Period Weeks    Status On-going    Target Date 09/15/19                 Plan - 08/18/19 1400    Clinical Impression Statement Pt. required increased cues to complete the [place] design pattern. Pt. began the task requiring few cues for the right side of the task, and required more cuing as the task progressed with increased cues required for the left side of the task. Pt. required step by step cues to complete with increased cues for shape position, and placement on the design pattern. Pt. Required ine reclining rest break during the task.    OT Occupational Profile and History Problem Focused Assessment - Including review of records relating to presenting problem    Occupational performance deficits (Please refer to evaluation for details): ADL's    Body Structure / Function / Physical Skills UE functional use;Coordination;FMC;Dexterity;Strength;ROM    Cognitive Skills Attention;Memory;Emotional;Problem Solve;Safety Awareness    Rehab Potential Good     Clinical Decision Making Several treatment options, min-mod task modification necessary    Comorbidities Affecting Occupational Performance: Presence of comorbidities impacting occupational performance    Comorbidities impacting occupational performance description: Phyical, cognitive, visual,  medical comorbidities    Modification or Assistance to Complete Evaluation  No modification of tasks or assist necessary to complete eval    OT Frequency 2x / week    OT Duration 12 weeks    OT Treatment/Interventions Self-care/ADL training;DME and/or AE instruction;Therapeutic exercise;Therapeutic activities;Moist Heat;Cognitive remediation/compensation;Neuromuscular education;Visual/perceptual remediation/compensation;Coping strategies training;Patient/family education;Passive range of motion;Psychosocial skills training;Energy conservation;Functional Mobility Training    Consulted and Agree with Plan of Care Patient    Family Member Consulted Wife           Patient will benefit from skilled therapeutic intervention in order to improve the following deficits and impairments:   Body Structure / Function / Physical Skills: UE functional use, Coordination, FMC, Dexterity, Strength, ROM Cognitive Skills: Attention, Memory, Emotional, Problem Solve, Safety Awareness     Visit Diagnosis: Muscle weakness (generalized)  Other lack of coordination    Problem List Patient Active Problem List   Diagnosis Date Noted  . Sepsis ([place] 10/18/2018    EHarrel Carina MS, OTR/L 08/18/2019, 2:04 PM  [place] [facility] [address] [place] [address]Phone: [phone]  Fax:  [phone]  Name: Kenneth Summers MRN: [id number] Date of Birth: 1945-05-26

## 2019-08-18 NOTE — Therapy (Signed)
[place] MAIN [facility] SERVICES [address] [place], [place], [address] Phone: [phone]   Fax:  [phone]  Physical Therapy Treatment  Patient Details  Name: Kenneth Summers MRN: [id number] Date of Birth: 1945/07/01 No data recorded  Encounter Date: 08/18/2019   PT End of Session - 08/18/19 1304    Visit Number 157    Number of Visits 180    Date for PT Re-Evaluation 08/20/19    Authorization Type Medicare reporting period starting 09/11/18    Authorization Time Period goals last assessed 05/26/19    PT Start Time 1301    PT Stop Time 1345    PT Time Calculation (min) 44 min    Equipment Utilized During Treatment Gait belt    Activity Tolerance Patient tolerated treatment well;No increased pain    Behavior During Therapy WFL for tasks assessed/performed           Past Medical History:  Diagnosis Date  . Actinic keratosis   . Alcohol abuse   . APS (antiphospholipid syndrome) ([place])   . Basal cell carcinoma   . BPH (benign prostatic hyperplasia)   . CAD (coronary artery disease)   . Cellulitis   . Cervical spinal stenosis   . Chronic osteomyelitis ([place])   . CKD (chronic kidney disease)   . CKD (chronic kidney disease)   . Clostridium difficile diarrhea   . Depression   . Diplopia   . Fatigue   . GERD (gastroesophageal reflux disease)   . Hyperlipemia   . Hypertension   . Hypovitaminosis D   . IBS (irritable bowel syndrome)   . 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 Insomnia   . Left lumbosacral radiculopathy   . Moderate obstructive sleep apnea   . Osteomyelitis of foot ([place])   . PAD (peripheral artery disease) ([place])   . Pruritus   . RA (rheumatoid arthritis) ([place])   . Radiculopathy   . Restless legs syndrome   . RLS (restless legs syndrome)   . SLE (systemic lupus erythematosus related syndrome) ([place])   . 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 Stroke ([place])   . Toxic maculopathy of both eyes      Past Surgical History:  Procedure Laterality Date  . CARDIAC CATHETERIZATION    . CAROTID ENDARTERECTOMY    . CERVICAL LAMINECTOMY    . CORONARY ANGIOPLASTY    . FRACTURE SURGERY    . fusion C5-6-7    . HERNIA REPAIR    . KNEE ARTHROSCOPY    . TONSILLECTOMY      There were no vitals filed for this visit.   Subjective Assessment - 08/18/19 1304    Subjective Patient presents to therapy reporting that he is doing well today. No falls since last therapy session and pt denies any pain upon arrival. He continues to have short duration vertigo, less than 1 minute, approximately once/week. No specific questions currently    Patient is accompained by: Family member    Pertinent History Patient is a 74 y.o. male who presents to outpatient physical therapy with a referral for medical diagnosis of CVA. This patient's chief complaints consist of left hemiplegia and overall deconditioning leading to the following functional deficits: dependent for ADLs, IADLs, unable to transfer to car, dependent transfers at home with hoyer lift, unable to walk or stand without significant assistance, difficulty with W/C navigation..    Limitations Sitting;Lifting;Standing;House hold activities;Writing;Walking;Other (comment)  Currently in Pain? No/denies    Pain Onset --               TREATMENT   Neuromuscular Re-education  Patient required mod A +2 for squat pivot transfer wheelchair to mat table; He then required min A +2 for transitioning sit to supine;  Performed Dix-Hallpike test which is negative bilaterally; Performed Roll test bilaterally and in R roll test position pt has a very slight apogeotrophic pure horizontal nystagmus of low amplitude lasting >1 minute. He denies any vertigo but reports some "blurring" of his vision. No nystagmus or vertigo in L roll test position however patient also complains of some blurring of his vision; Patient required mod A +1 for transition sidelying to  sitting edge of mat table; Patient did require mod A +2 to transfer mat table to wheelchair with mod VCs for hand placement and trunk position;   Therapeutic Activity Practiced sit to stand transfers x 3 and standing endurance tolerance with patient during session. He requiresmin/modA+1with mod VCs for weight shift and increased extension for better stance control. Patient initially exhibits flexed posture with decreased hip extension. He also exhibits heavy lean toleftside requiring instruction for weight shift to neutral position. Throughout standing duration pt requiring intermittent cues to extend his hips and shift weight anteriorly. Worked on standing endurance however with each repeated bout he becomes increasingly more fatigued. Also worked on lateral weight shifting in standing for positioning of feet. Patient requiredmodA to scoot back in chair at end of session due to increased fatigue   Pt educated throughout session about proper posture and technique with transfers and during standing. Improved  technique, movement at target joints, use of target muscles after min to mod verbal, visual, tactile cues.    Practiced sit to stand transfers and standing endurance tolerance with patient during session inside the parallel bars. He requiresmin/modA+1with mod VCs for weight shift and increased extension for better stance control. Patient initially exhibits flexed posture with decreased hip extension. Throughout standing duration pt requiring intermittent cues to extend his hips and shift weight anteriorly. Due to complaints of ongoing vertigo also retested pt for BPPV. Negative Dix-Hallpike test bilaterally. Performed Roll test bilaterally and in R roll test position pt has a very slight apogeotrophic pure horizontal nystagmus of low amplitude lasting >1 minute. He denies any vertigo but reports some "blurring" of his vision. No nystagmus or vertigo in L roll test position however patient  also complains of some blurring of his vision. 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 It is possible that pt has L horizontal cupulolithiasis however it would be difficult to treat due to patient's limited mobility. If symptoms continue to bother patient therapist can attempt to treat. Pt encouraged to follow-up as scheduled.                                 PT Short Term Goals - 08/18/19 1305      PT SHORT TERM GOAL #1   Title Be independent with initial home exercise program for self-management of symptoms.    Baseline HEP to be given at second session; advice to practice unsupported sitting at home (06/19/2018); 07/08/18: Performing at home, 6/29: needs assistance but is doing exercise program regularly; 08/22/18: adherent;    Time 2    Period Weeks    Status Achieved    Target Date 06/26/18  PT Long Term Goals - 07/28/19 1301      PT LONG TERM GOAL #1   Title Patient will complete all bed mobility with min A to improve functional independence for getting in and out of bed and adjusting in bed.     Baseline max A - total A reported by family (06/19/2018); 07/08/18: still requires maxA, dtr reports improvement in rolling since starting therapy; 6/23: min A to supervision in clinic; not doing at home right; 8/5: Pt's daughter states that his rolling has improved, but still has difficulty with all other bed mobility; 10/23/18: pts daughter states that she is doing about 35% of the work if he is in proper position, he does uses the bed rails to help roll;  using Hoyer for coming to sit EOB; 10/12: pts daughter states that she is doing about 30% of the work if he is in proper position, he does uses the bed rails to help roll, but it is improving;  using Hoyer for coming to sit EOB, 10/29: min A for sit to sidelying, rolling supervision, able to transition sidelying to sit with HHA;    Time 12    Period Weeks     Status Achieved      PT LONG TERM GOAL #2   Title Patient will complete sit <> stand transfer chair to chair with Min A and LRAD to improve functional independence for household and community mobility.     Baseline supervision for STS, still requires elevated surface height to 27"    Time 12    Period Weeks    Status Achieved      PT LONG TERM GOAL #3   Title Patient will navigate power w/c with min A x 100 feet to improve mobility for household and short community distances.    Baseline required total A (06/12/2018); able to roll 20 feet with min A (06/20/2018); 07/08/18: Pt can go approximately 10' without assist per family;  10/23/2018: pt's daughter states that his power WC mobility has become 100% better with the new hand control, slowed speed, and with practice; 10/12: pt's daughter reports that he can perform household WC mobility and limited community ambulation with minA    Time 12    Period Weeks    Status Achieved      PT LONG TERM GOAL #4   Title Patient will complete car transfers W/C to family SUV with min A using LRAD to improve his abilty to participate in community activities.     Baseline unable to complete car transfers at all (06/12/2018); unable to complete car transfers at all (06/19/2018).; 07/08/18: Unable to perform at this time; 09/11/18: unable to perform at this time; 10/23/2018: unable to complete car transfers at all; 10/12:  unable to complete car transfers at all, 10/29: unable; 01/13/19: unable; 2/11: min-mod A +2, 3/10: min A+1    Time 12    Period Weeks    Status Achieved      PT LONG TERM GOAL #5   Title Patient will ambulate at least 10 feet with min A using LRAD to improve mobility for household and community distances.    Baseline unable to take steps (06/12/2018); able to shift R leg forward and back with max A and RW at edge of plinth (06/19/2018); 07/08/18: unable to ambulate at this time. 09/11/18: unable to perform at this time; 10/23/2018: unable to perform at this  time; 10/12: unable to perform at this time, 10/29: unable at this time;  01/13/19: unable at this time, 2/11: min A to step with RLE, unable to step with LLE, 3/10: able to take 2 steps in parallel bars with min A for safety; 4/1: min A for walking 10 feet in parallel bars, 5/24: min A walking 10 feet in parallel bars; 6/21: no change    Time 12    Period Weeks    Status Partially Met    Target Date 08/20/19      PT LONG TERM GOAL #6   Title Patient will be able to safety navigate up/down ramp with power chair, exhibiting good safety awareness, mod I to safely enter/exit his home.    Baseline 2/11: supervision with intermittent min A;4/1: supervision; 5/24: supervision    Time 12    Period Weeks    Status Partially Met    Target Date 08/20/19      PT LONG TERM GOAL #7   Title Patient will increase functional reach test to >15 inches in sitting to exhibit improved sitting balance and positioning and reduce fall risk;    Baseline 07/30/18: 12 inches; 09/11/18: 10-12 inches; 10/23/18: 12-13 inch; 10/12: 14.5", 10/29: 15.5 inch    Time 4    Period Weeks    Status Achieved    Target Date 08/20/19      PT LONG TERM GOAL #8   Title Patient will increase LLE quad strength to 3+/5 to improve functional strength for standing and mobility;    Baseline 10/28: 2+/5; 01/13/19: 2+/5, 2/11: 3-/5, 04/16/19: 3-/5, 05/08/19: 3-/5, 5/24: 3-/5, 6/21: 3-/5    Time 12    Period Weeks    Status Partially Met    Target Date 08/20/19      PT LONG TERM GOAL  #9   TITLE Patient will report no vertigo with provoking motions or positions in order to be able to perform ADLs and mobility tasks without symptoms.    Baseline 06/30/19: reports no dizziness in last week; 6/21: one episode this weekend;    Time 12    Period Weeks    Status Partially Met    Target Date 08/20/19                 Plan - 08/18/19 1305    Clinical Impression Statement Practiced sit to stand transfers and standing endurance tolerance with  patient during session inside the parallel bars. He requires min/modA+1 with mod VCs for weight shift and increased extension for better stance control. Patient initially exhibits flexed posture with decreased hip extension. Throughout standing duration pt requiring intermittent cues to extend his hips and shift weight anteriorly. Due to complaints of ongoing vertigo also retested pt for BPPV. Negative Dix-Hallpike test bilaterally. Performed Roll test bilaterally and in R roll test position pt has a very slight apogeotrophic pure horizontal nystagmus of low amplitude lasting >1 minute. He denies any vertigo but reports some "blurring" of his vision. No nystagmus or vertigo in L roll test position however patient also complains of some blurring of his vision. 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 It is possible that pt has L horizontal cupulolithiasis however it would be difficult to treat due to patient's limited mobility. If symptoms continue to bother patient therapist can attempt to treat. Pt encouraged to follow-up as scheduled.    Personal Factors and Comorbidities Age;Comorbidity 3+    Comorbidities Relevant past medical history and comorbidities include long term steroid use for lupus, CKD, chronic osteomyelitis, cervical spine stenosis,  BPH, APS, alcohol abuse, peripheral artery disease, depression, GERD, obstructive sleep apnea, HTN, IBS, IgA deficiency, lumbar radiculopathy, RA, Stroke, toxic maculopathy in both eyes, systemic lupus erythematosus related syndrome, cardiac catheterization, carotid endarterectomy, cervical laminectomy, coronary angioplasty, Fusion C5-C7, knee arthroplasty, lumbar surgery.    Examination-Activity Limitations Bed Mobility;Bend;Sit;Toileting;Stand;Stairs;Lift;Transfers;Squat;Locomotion Level;Carry;Dressing;Hygiene/Grooming;Continence    Examination-Participation Restrictions Yard Work;Interpersonal Relationship;Community  Activity    Stability/Clinical Decision Making Evolving/Moderate complexity    Rehab Potential Fair    PT Frequency 3x / week    PT Duration 12 weeks    PT Treatment/Interventions ADLs/Self Care Home Management;Electrical Stimulation;Moist Heat;Cryotherapy;Gait training;Stair training;Functional mobility training;Therapeutic exercise;Balance training;Neuromuscular re-education;Cognitive remediation;Patient/family education;Orthotic Fit/Training;Wheelchair mobility training;Manual techniques;Passive range of motion;Energy conservation;Joint Manipulations;Canalith Repostioning;Therapeutic activities;Vestibular    PT Next Visit Plan Work on strength, sitting/standing balance, functional activities such as rolling, bed mobility, transfers; caregiver training    PT Home Exercise Plan no updates this session    Consulted and Agree with Plan of Care Patient;Family member/caregiver    Family Member Consulted Wife           Patient will benefit from skilled therapeutic intervention in order to improve the following deficits and impairments:  Abnormal gait, Decreased activity tolerance, Decreased cognition, Decreased endurance, Decreased knowledge of use of DME, Decreased range of motion, Decreased skin integrity, Decreased strength, Impaired perceived functional ability, Impaired sensation, Impaired UE functional use, Improper body mechanics, Pain, Cardiopulmonary status limiting activity, Decreased balance, Decreased coordination, Decreased mobility, Difficulty walking, Impaired tone, Postural dysfunction, Dizziness  Visit Diagnosis: Muscle weakness (generalized)  Difficulty in walking, not elsewhere classified     Problem List Patient Active Problem List   Diagnosis Date Noted  . Sepsis ([place]) 10/18/2018   Phillips Grout PT, DPT, GCS  Huprich,Jason 08/18/2019, 3:28 PM  [place] MAIN [facility] SERVICES [address] [place], [place], [address] Phone:  [phone]   Fax:  [phone]  Name: Kenneth Summers MRN: [id number] Date of Birth: [birth year]

## 2019-08-20 ENCOUNTER — Encounter: Payer: Self-pay | Admitting: Physical Therapy

## 2019-08-20 ENCOUNTER — Ambulatory Visit: Payer: Medicare Other | Admitting: Physical Therapy

## 2019-08-20 ENCOUNTER — Other Ambulatory Visit: Payer: Self-pay

## 2019-08-20 DIAGNOSIS — M6281 Muscle weakness (generalized): Secondary | ICD-10-CM | POA: Diagnosis not present

## 2019-08-20 DIAGNOSIS — R262 Difficulty in walking, not elsewhere classified: Secondary | ICD-10-CM

## 2019-08-20 DIAGNOSIS — R296 Repeated falls: Secondary | ICD-10-CM

## 2019-08-20 DIAGNOSIS — R278 Other lack of coordination: Secondary | ICD-10-CM

## 2019-08-20 NOTE — Therapy (Signed)
[place] MAIN [facility] SERVICES [address] [place], [place], [address] Phone: [phone]   Fax:  [phone]  Physical Therapy Treatment  Patient Details  Name: Kenneth Summers MRN: [id number] Date of Birth: [birth year] No data recorded  Encounter Date: 08/20/2019   PT End of Session - 08/20/19 1305    Visit Number [id number]    Number of Visits 216    Date for PT Re-Evaluation 11/12/19    Authorization Type Medicare reporting period starting 09/11/18; NO FOTO    Authorization Time Period goals last assessed 08/20/19    PT Start Time 1302    PT Stop Time 1345    PT Time Calculation (min) 43 min    Equipment Utilized During Treatment Gait belt    Activity Tolerance Patient tolerated treatment well;No increased pain    Behavior During Therapy WFL for tasks assessed/performed           Past Medical History:  Diagnosis Date  . Actinic keratosis   . Alcohol abuse   . APS (antiphospholipid syndrome) ([place])   . Basal cell carcinoma   . BPH (benign prostatic hyperplasia)   . CAD (coronary artery disease)   . Cellulitis   . Cervical spinal stenosis   . Chronic osteomyelitis ([place])   . CKD (chronic kidney disease)   . CKD (chronic kidney disease)   . Clostridium difficile diarrhea   . Depression   . Diplopia   . Fatigue   . GERD (gastroesophageal reflux disease)   . Hyperlipemia   . Hypertension   . Hypovitaminosis D   . IBS (irritable bowel syndrome)   . 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 Insomnia   . Left lumbosacral radiculopathy   . Moderate obstructive sleep apnea   . Osteomyelitis of foot ([place])   . PAD (peripheral artery disease) ([place])   . Pruritus   . RA (rheumatoid arthritis) ([place])   . Radiculopathy   . Restless legs syndrome   . RLS (restless legs syndrome)   . SLE (systemic lupus erythematosus related syndrome) ([place])   . 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 Stroke ([place])   . Toxic maculopathy of  both eyes     Past Surgical History:  Procedure Laterality Date  . CARDIAC CATHETERIZATION    . CAROTID ENDARTERECTOMY    . CERVICAL LAMINECTOMY    . CORONARY ANGIOPLASTY    . FRACTURE SURGERY    . fusion C5-6-7    . HERNIA REPAIR    . KNEE ARTHROSCOPY    . TONSILLECTOMY      There were no vitals filed for this visit.   Subjective Assessment - 08/20/19 1304    Subjective Patient reports doing well; feeling strong today; no dizziness;    Patient is accompained by: Family member    Pertinent History Patient is a 74 y.o. male who presents to outpatient physical therapy with a referral for medical diagnosis of CVA. This patient's chief complaints consist of left hemiplegia and overall deconditioning leading to the following functional deficits: dependent for ADLs, IADLs, unable to transfer to car, dependent transfers at home with hoyer lift, unable to walk or stand without significant assistance, difficulty with W/C navigation..    Limitations Sitting;Lifting;Standing;House hold activities;Writing;Walking;Other (comment)    Currently in Pain? No/denies                  TREATMENT: Prior to transfersand in between transfers: Patient sittingin  wheelchair: -unsupported sitting head turns side/side x5 reps with cues for erect posture -deep breathing x5 reps; -LLE LAQ 2x10 reps with visual cues for increased ROM -hip flexion march x10 reps each LE with verbal/visual cues to increase AROM;  Reinforced HEP with instruction to focus on isolating one leg at a time to help with disassociation;  Ptrequires verbal and visual cues to improve ROM;He was able to exhibit improved LLE AROM with cues; Patient also requires cues for erect posture for better trunk control;  Following exercise: Transferred sit<>Stand in parallel bars (seat adjusted to 26-27 inch from floor height) x10 reps throughout session;He was able to transfermin A to CGA,  herequiredmodVCs for forward  weight shift for better trunk control and transfer ability;Patient does exhibit increased lateral lean to left side this session;  Standing in parallel bars: Standing,min Awith min VCs for weight shift and increased extension for better stance control; -lateral weight shift side/sidewith hands on rails x10 reps with min A for balance/safety; -Mini squat2x10repswith CGA and min VCs for weight shift and to increase LE muscle activation; -BLE heel raises x8 reps with min A for positioning with increased knee flexion noted; Patient has difficulty with forward weight shift;   Patient initially exhibits flexed posture with decreased hip extension;he also exhibits heavy lean toleftside requiring instruction for weight shift to neutral position;    Patient requiredmodA to scoot back in chair at end of session due to increased fatigue    Response to treatment: Toleratedfair. He was more fatigued this session requiring increased cues and assistance initially. Patient continues to exhibit decreased hip extension in stance with heavy posterior lean requiring min A for balance/control. He was able to exhibit better trunk control when sitting unsupported with less instability;  Patient would benefit from additional skilled PT Intervention to improve strength, balance and mobility;                    PT Education - 08/20/19 1304    Education Details transfers/positioning;    Person(s) Educated Patient    Methods Explanation;Verbal cues    Comprehension Verbalized understanding;Returned demonstration;Verbal cues required;Need further instruction            PT Short Term Goals - 08/18/19 1305      PT SHORT TERM GOAL #1   Title Be independent with initial home exercise program for self-management of symptoms.    Baseline HEP to be given at second session; advice to practice unsupported sitting at home (06/19/2018); 07/08/18: Performing at home, 6/29: needs assistance  but is doing exercise program regularly; 08/22/18: adherent;    Time 2    Period Weeks    Status Achieved    Target Date 06/26/18             PT Long Term Goals - 08/20/19 1456      PT LONG TERM GOAL #1   Title Patient will complete all bed mobility with min A to improve functional independence for getting in and out of bed and adjusting in bed.     Baseline max A - total A reported by family (06/19/2018); 07/08/18: still requires maxA, dtr reports improvement in rolling since starting therapy; 6/23: min A to supervision in clinic; not doing at home right; 8/5: Pt's daughter states that his rolling has improved, but still has difficulty with all other bed mobility; 10/23/18: pts daughter states that she is doing about 35% of the work if he is in proper position, he does  uses the bed rails to help roll;  using Harrel Lemon for coming to sit EOB; 10/12: pts daughter states that she is doing about 30% of the work if he is in proper position, he does uses the bed rails to help roll, but it is improving;  using Hoyer for coming to sit EOB, 10/29: min A for sit to sidelying, rolling supervision, able to transition sidelying to sit with HHA;    Time 12    Period Weeks    Status Achieved      PT LONG TERM GOAL #2   Title Patient will complete sit <> stand transfer chair to chair with Min A and LRAD to improve functional independence for household and community mobility.     Baseline supervision for STS, still requires elevated surface height to 27"    Time 12    Period Weeks    Status Achieved      PT LONG TERM GOAL #3   Title Patient will navigate power w/c with min A x 100 feet to improve mobility for household and short community distances.    Baseline required total A (06/12/2018); able to roll 20 feet with min A (06/20/2018); 07/08/18: Pt can go approximately 10' without assist per family;  10/23/2018: pt's daughter states that his power WC mobility has become 100% better with the new hand control, slowed  speed, and with practice; 10/12: pt's daughter reports that he can perform household WC mobility and limited community ambulation with minA    Time 12    Period Weeks    Status Achieved      PT LONG TERM GOAL #4   Title Patient will complete car transfers W/C to family SUV with min A using LRAD to improve his abilty to participate in community activities.     Baseline unable to complete car transfers at all (06/12/2018); unable to complete car transfers at all (06/19/2018).; 07/08/18: Unable to perform at this time; 09/11/18: unable to perform at this time; 10/23/2018: unable to complete car transfers at all; 10/12:  unable to complete car transfers at all, 10/29: unable; 01/13/19: unable; 2/11: min-mod A +2, 3/10: min A+1    Time 12    Period Weeks    Status Achieved      PT LONG TERM GOAL #5   Title Patient will ambulate at least 10 feet with min A using LRAD to improve mobility for household and community distances.    Baseline unable to take steps (06/12/2018); able to shift R leg forward and back with max A and RW at edge of plinth (06/19/2018); 07/08/18: unable to ambulate at this time. 09/11/18: unable to perform at this time; 10/23/2018: unable to perform at this time; 10/12: unable to perform at this time, 10/29: unable at this time; 01/13/19: unable at this time, 2/11: min A to step with RLE, unable to step with LLE, 3/10: able to take 2 steps in parallel bars with min A for safety; 4/1: min A for walking 10 feet in parallel bars, 5/24: min A walking 10 feet in parallel bars; 6/21: no change 7/14: no change    Time 12    Period Weeks    Status Partially Met    Target Date 11/12/19      PT LONG TERM GOAL #6   Title Patient will be able to safety navigate up/down ramp with power chair, exhibiting good safety awareness, mod I to safely enter/exit his home.    Baseline 2/11: supervision with intermittent  min A;4/1: supervision; 5/24: supervision    Time 12    Period Weeks    Status Partially Met     Target Date 11/12/19      PT LONG TERM GOAL #7   Title Patient will increase functional reach test to >15 inches in sitting to exhibit improved sitting balance and positioning and reduce fall risk;    Baseline 07/30/18: 12 inches; 09/11/18: 10-12 inches; 10/23/18: 12-13 inch; 10/12: 14.5", 10/29: 15.5 inch    Time 4    Period Weeks    Status Achieved      PT LONG TERM GOAL #8   Title Patient will increase LLE quad strength to 3+/5 to improve functional strength for standing and mobility;    Baseline 10/28: 2+/5; 01/13/19: 2+/5, 2/11: 3-/5, 04/16/19: 3-/5, 05/08/19: 3-/5, 5/24: 3-/5, 6/21: 3-/5, 7/14: 3-/5    Time 12    Period Weeks    Status Partially Met    Target Date 11/12/19      PT LONG TERM GOAL  #9   TITLE Patient will report no vertigo with provoking motions or positions in order to be able to perform ADLs and mobility tasks without symptoms.    Baseline 06/30/19: reports no dizziness in last week; 6/21: one episode this weekend; 7/14: experiencing one a week;    Time 12    Period Weeks    Status Partially Met    Target Date 11/12/19                 Plan - 08/20/19 1329    Clinical Impression Statement Patient motivated and participated well within session. Initially he required min A for sit<>Stand transfer from 26 inch height surface. He was able to progress to CGA from 27 inch height surface. He continues to require heavy cues for forward weight shift and to increase hip/trunk extension upon standing. Patient does fatigue quickly requiring short seated rest breaks. He is able to exhibit improved LE AROM following increased exercise/mobility. Patient does fatigue quickly this session likely due to missed session last week. Patient does require elevated surfaces for transfers. He does seem to exhibit better AROM and motor control with functional tasks compared to specific exercise. He would benefit from additional skilled PT intervention to improve strength, balance and mobility;     Personal Factors and Comorbidities Age;Comorbidity 3+    Comorbidities Relevant past medical history and comorbidities include long term steroid use for lupus, CKD, chronic osteomyelitis, cervical spine stenosis, BPH, APS, alcohol abuse, peripheral artery disease, depression, GERD, obstructive sleep apnea, HTN, IBS, IgA deficiency, lumbar radiculopathy, RA, Stroke, toxic maculopathy in both eyes, systemic lupus erythematosus related syndrome, cardiac catheterization, carotid endarterectomy, cervical laminectomy, coronary angioplasty, Fusion C5-C7, knee arthroplasty, lumbar surgery.    Examination-Activity Limitations Bed Mobility;Bend;Sit;Toileting;Stand;Stairs;Lift;Transfers;Squat;Locomotion Level;Carry;Dressing;Hygiene/Grooming;Continence    Examination-Participation Restrictions Yard Work;Interpersonal Relationship;Community Activity    Stability/Clinical Decision Making Evolving/Moderate complexity    Rehab Potential Fair    PT Frequency 3x / week    PT Duration 12 weeks    PT Treatment/Interventions ADLs/Self Care Home Management;Electrical Stimulation;Moist Heat;Cryotherapy;Gait training;Stair training;Functional mobility training;Therapeutic exercise;Balance training;Neuromuscular re-education;Cognitive remediation;Patient/family education;Orthotic Fit/Training;Wheelchair mobility training;Manual techniques;Passive range of motion;Energy conservation;Joint Manipulations;Canalith Repostioning;Therapeutic activities;Vestibular    PT Next Visit Plan Work on strength, sitting/standing balance, functional activities such as rolling, bed mobility, transfers; caregiver training    PT Home Exercise Plan no updates this session    Consulted and Agree with Plan of Care Patient;Family member/caregiver    Family Member Consulted Wife  Patient will benefit from skilled therapeutic intervention in order to improve the following deficits and impairments:  Abnormal gait, Decreased activity  tolerance, Decreased cognition, Decreased endurance, Decreased knowledge of use of DME, Decreased range of motion, Decreased skin integrity, Decreased strength, Impaired perceived functional ability, Impaired sensation, Impaired UE functional use, Improper body mechanics, Pain, Cardiopulmonary status limiting activity, Decreased balance, Decreased coordination, Decreased mobility, Difficulty walking, Impaired tone, Postural dysfunction, Dizziness  Visit Diagnosis: Muscle weakness (generalized)  Other lack of coordination  Difficulty in walking, not elsewhere classified  Repeated falls     Problem List Patient Active Problem List   Diagnosis Date Noted  . Sepsis ([place]) 10/18/2018    Jeneen Doutt PT, DPT 08/20/2019, 2:59 PM  [place] MAIN [facility] SERVICES [address] [place], [place], [address] Phone: [phone]   Fax:  [phone]  Name: Kenneth Summers MRN: [id number] Date of Birth: 03-09-1945

## 2019-08-21 ENCOUNTER — Encounter: Payer: Self-pay | Admitting: Occupational Therapy

## 2019-08-21 ENCOUNTER — Other Ambulatory Visit: Payer: Self-pay

## 2019-08-21 ENCOUNTER — Ambulatory Visit: Payer: Medicare Other | Admitting: Physical Therapy

## 2019-08-21 ENCOUNTER — Encounter: Payer: Self-pay | Admitting: Physical Therapy

## 2019-08-21 ENCOUNTER — Ambulatory Visit: Payer: Medicare Other | Admitting: Occupational Therapy

## 2019-08-21 DIAGNOSIS — M6281 Muscle weakness (generalized): Secondary | ICD-10-CM

## 2019-08-21 DIAGNOSIS — R296 Repeated falls: Secondary | ICD-10-CM

## 2019-08-21 DIAGNOSIS — R278 Other lack of coordination: Secondary | ICD-10-CM

## 2019-08-21 DIAGNOSIS — R262 Difficulty in walking, not elsewhere classified: Secondary | ICD-10-CM

## 2019-08-21 NOTE — Therapy (Signed)
[place] MAIN [facility] SERVICES [address] [place], [place], [address] Phone: [phone]   Fax:  [phone]  Occupational Therapy Treatment  Patient Details  Name: Kenneth Summers MRN: [id number] Date of Birth: [birth year] No data recorded  Encounter Date: 08/21/2019   OT End of Session - 08/21/19 1400    Visit Number 128    Number of Visits 157    Date for OT Re-Evaluation 09/15/19    Authorization Type Progress report period starting 07/21/2019    OT Start Time 1347    OT Stop Time 1430    OT Time Calculation (min) 43 min    Activity Tolerance Patient tolerated treatment well    Behavior During Therapy [facility] for tasks assessed/performed           Past Medical History:  Diagnosis Date  . Actinic keratosis   . Alcohol abuse   . APS (antiphospholipid syndrome) ([place])   . Basal cell carcinoma   . BPH (benign prostatic hyperplasia)   . CAD (coronary artery disease)   . Cellulitis   . Cervical spinal stenosis   . Chronic osteomyelitis ([place])   . CKD (chronic kidney disease)   . CKD (chronic kidney disease)   . Clostridium difficile diarrhea   . Depression   . Diplopia   . Fatigue   . GERD (gastroesophageal reflux disease)   . Hyperlipemia   . Hypertension   . Hypovitaminosis D   . IBS (irritable bowel syndrome)   . 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 Insomnia   . Left lumbosacral radiculopathy   . Moderate obstructive sleep apnea   . Osteomyelitis of foot ([place])   . PAD (peripheral artery disease) ([place])   . Pruritus   . RA (rheumatoid arthritis) ([place])   . Radiculopathy   . Restless legs syndrome   . RLS (restless legs syndrome)   . SLE (systemic lupus erythematosus related syndrome) ([place])   . 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 Stroke ([place])   . Toxic maculopathy of both eyes     Past Surgical History:  Procedure Laterality Date  . CARDIAC CATHETERIZATION    . CAROTID ENDARTERECTOMY    .  CERVICAL LAMINECTOMY    . CORONARY ANGIOPLASTY    . FRACTURE SURGERY    . fusion C5-6-7    . HERNIA REPAIR    . KNEE ARTHROSCOPY    . TONSILLECTOMY      There were no vitals filed for this visit.   Subjective Assessment - 08/21/19 1358    Subjective  Pt. reports that he is having a good day.    Patient is accompanied by: Family member    Pertinent History Pt. is a 74 y.o. 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 Pt. PMHx includes: Multiple Falls, Lupus, DJD, Renal Abscess, CVA. Pt. resides with his wife. Pt.'s wife and daughter assist with ADLs. Pt. has caregivers in  for 2 hours a day, 6 days a week. Pt. received Rehab services in acute care, at SNF for STR, and [place] services. Pt. is retired from [employer] for [employer] and [employer].    Patient Stated Goals To regain use of his left UE, and do more for himself.    Currently in Pain? No/denies          OT TREATMENT   Neuro muscular re-education:  Pt. worked on using his LUE  for reaching, grasping, and flipping [place] style discs. Pt. worked or reaching to the far left, and far right with trunk rotation when flipping 60 discs lined up into 4 rows of 15 discs. Pt. worked on using his left hand to stack the discsin groups of 3, and place them into the case.Pt. Required fewer cues to cross midline with the LUE over to the far right. Pt. worked on bilateral hand coordination skills flipping the discs, and simultaneously flipping them with the right, and left hands starting at the far left, and far right moving towards center. Pt had difficulty completing the task simultaneously, and alternated between the right, and left.  Pt. required less verbal cues to use his left hand, more cues for rest breaks secondary to fatigue, and increased time to complete the task today, as well as forward flexing with the trunk. Pt. continues to require cues to engage his LUE in, and consistently use his left  UEhowever required significantly less cuing.Pt.required more cuing to engage his trunk when flexing forward with reaching.Pt.continues to work on improving LUE functioning, left sidedawareness,trunk control,and processing skills needed for ADL, and IADL functioning.                        OT Education - 08/21/19 1400    Education Details Design patterns, left sided awarenss.    Person(s) Educated Patient    Methods Explanation;Demonstration    Comprehension Returned demonstration;Tactile cues required;Verbalized understanding;Need further instruction               OT Long Term Goals - 07/23/19 0826      OT LONG TERM GOAL #1   Title Pt. will increase left shoulder flexion AROM by 10 degrees to access cabinet/shelf.    Baseline Shoulder flexion:130. Pt. is progressing, and continues to work on reaching with increasing emphasis on flexing trunk to improve functional reach.  07/21/19- shoulder flexion to 130 with effort    Time 12    Period Weeks    Status Partially Met    Target Date 09/15/19      OT LONG TERM GOAL #2   Title Pt. will donn a shirt with Supervision.    Baseline Pt. continues to require MinA donning a zip down jacket with consistent cues to initiate the correct direction of the jacket.  07/21/19 continues to require min assist at times for shirt/jacket    Time 12    Status On-going    Target Date 09/15/19      OT LONG TERM GOAL #4   Title Pt. will improve left grip strength by 10# to be able to open a jar/container.    Baseline Pt. continues to work towards opening containers, and bottles.    Time 12    Period Weeks    Status On-going    Target Date 09/15/19      OT LONG TERM GOAL #5   Title Pt. will improve left hand [facility] skills to be able to assist with buttoning/zipping.    Baseline Pt. is improving with manipulating the zipper on his jacket, however continues to require practice. Pt. continues to work on improving bilateral [facility]  skills for buttoning/zipping.  07/21/19 improving with buttons and zippers.    Time 12    Period Weeks    Status On-going    Target Date 09/15/19      OT LONG TERM GOAL #6   Title Pt. will demonstrate visual conmpensatory strategies for 100%  of the time during ADLs    Baseline Pt. requires  fewer cues for left sides awareness.    Time 12    Period Weeks    Status Partially Met    Target Date 09/15/19      OT LONG TERM GOAL #8   Title Pt. will accurately identify potential safety hazard using good safety awareness, and judgement 100% for ADLs, and IADLs.    Baseline Pt. requires cues    Time 12    Period Weeks    Status On-going    Target Date 09/15/19      OT LONG TERM GOAL  #9   TITLE Pt. will  navigate his w/c around obstacles with Supervision and 100% accuracy    Baseline Pt. continues to require cuing for obstacle on the left, however requires less cues. 07/21/2019 continues to improve with decreased assist at times    Time 12    Period Weeks    Status On-going    Target Date 09/15/19      OT LONG TERM GOAL  #11   TITLE Pt. will increase BUE strength by 48m grades in preparation for ADL transfers.    Baseline , 07/21/19 continues to work towards improved strength, LUE 3-/5    Time 12    Period Weeks    Status On-going    Target Date 09/15/19      OT LONG TERM GOAL  #12   TITLE Pt. will improve LUE functional reaching with the left engaging the trunk 100% of the time during ADL tasks with minimal cues.    Baseline 07/21/2019: Pt. is improving with LUE functional reachin, howwver requires cues to engage trunk for extended reaching.    Time 12    Period Weeks    Status On-going    Target Date 09/15/19                 Plan - 08/21/19 1401    Clinical Impression Statement Pt. required less verbal cues to use his left hand, more cues for rest breaks secondary to fatigue, and increased time to complete the task today, as well as forward flexing with the trunk. Pt.  continues to require cues to engage his LUE in, and consistently use his left UEhowever required significantly less cuing.Pt.required more cuing to engage his trunk when flexing forward with reaching.Pt.continues to work on improving LUE functioning, left sidedawareness,trunk control,and processing skills needed for ADL, and IADL functioning.   Occupational performance deficits (Please refer to evaluation for details): ADL's    Body Structure / Function / Physical Skills UE functional use;Coordination;FMC;Dexterity;Strength;ROM    Cognitive Skills Attention;Memory;Emotional;Problem Solve;Safety Awareness    Rehab Potential Good    Clinical Decision Making Several treatment options, min-mod task modification necessary    Comorbidities Affecting Occupational Performance: Presence of comorbidities impacting occupational performance    Comorbidities impacting occupational performance description: Phyical, cognitive, visual,  medical comorbidities    Modification or Assistance to Complete Evaluation  No modification of tasks or assist necessary to complete eval    OT Frequency 2x / week    OT Duration 12 weeks    OT Treatment/Interventions Self-care/ADL training;DME and/or AE instruction;Therapeutic exercise;Therapeutic activities;Moist Heat;Cognitive remediation/compensation;Neuromuscular education;Visual/perceptual remediation/compensation;Coping strategies training;Patient/family education;Passive range of motion;Psychosocial skills training;Energy conservation;Functional Mobility Training    Consulted and Agree with Plan of Care Patient           Patient will benefit from skilled therapeutic intervention in order to improve the following deficits and  impairments:   Body Structure / Function / Physical Skills: UE functional use, Coordination, FMC, Dexterity, Strength, ROM Cognitive Skills: Attention, Memory, Emotional, Problem Solve, Safety Awareness     Visit Diagnosis: Muscle  weakness (generalized)  Other lack of coordination    Problem List Patient Active Problem List   Diagnosis Date Noted  . Sepsis ([place]) 10/18/2018    Harrel Carina, MS, OTR/L 08/21/2019, 2:05 PM  [place] MAIN [facility] SERVICES [address] [place], [place], [address] Phone: [phone]   Fax:  [phone]  Name: PLACIDO HANGARTNER MRN: [id number] Date of Birth: [birth year]

## 2019-08-21 NOTE — Therapy (Signed)
[place] MAIN [facility] SERVICES [address] [place], [place], [address] Phone: [phone]   Fax:  [phone]  Physical Therapy Treatment  Patient Details  Name: Kenneth Summers MRN: [id number] Date of Birth: 1945/07/25 No data recorded  Encounter Date: 08/21/2019   PT End of Session - 08/21/19 1302    Visit Number [id number]    Number of Visits 216    Date for PT Re-Evaluation 11/12/19    Authorization Type Medicare reporting period starting 09/11/18; NO FOTO    Authorization Time Period goals last assessed 08/20/19    PT Start Time 1302    PT Stop Time 1345    PT Time Calculation (min) 43 min    Equipment Utilized During Treatment Gait belt    Activity Tolerance Patient tolerated treatment well;No increased pain    Behavior During Therapy WFL for tasks assessed/performed           Past Medical History:  Diagnosis Date  . Actinic keratosis   . Alcohol abuse   . APS (antiphospholipid syndrome) ([place])   . Basal cell carcinoma   . BPH (benign prostatic hyperplasia)   . CAD (coronary artery disease)   . Cellulitis   . Cervical spinal stenosis   . Chronic osteomyelitis ([place])   . CKD (chronic kidney disease)   . CKD (chronic kidney disease)   . Clostridium difficile diarrhea   . Depression   . Diplopia   . Fatigue   . GERD (gastroesophageal reflux disease)   . Hyperlipemia   . Hypertension   . Hypovitaminosis D   . IBS (irritable bowel syndrome)   . 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 Insomnia   . Left lumbosacral radiculopathy   . Moderate obstructive sleep apnea   . Osteomyelitis of foot ([place])   . PAD (peripheral artery disease) ([place])   . Pruritus   . RA (rheumatoid arthritis) ([place])   . Radiculopathy   . Restless legs syndrome   . RLS (restless legs syndrome)   . SLE (systemic lupus erythematosus related syndrome) ([place])   . 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 Stroke ([place])   . Toxic maculopathy of  both eyes     Past Surgical History:  Procedure Laterality Date  . CARDIAC CATHETERIZATION    . CAROTID ENDARTERECTOMY    . CERVICAL LAMINECTOMY    . CORONARY ANGIOPLASTY    . FRACTURE SURGERY    . fusion C5-6-7    . HERNIA REPAIR    . KNEE ARTHROSCOPY    . TONSILLECTOMY      There were no vitals filed for this visit.   Subjective Assessment - 08/21/19 1554    Subjective Patient reports doing well; denies any dizziness; reports feeling fatigued after last session but is better this afternoon;    Patient is accompained by: Family member    Pertinent History Patient is a 74 y.o. male who presents to outpatient physical therapy with a referral for medical diagnosis of CVA. This patient's chief complaints consist of left hemiplegia and overall deconditioning leading to the following functional deficits: dependent for ADLs, IADLs, unable to transfer to car, dependent transfers at home with hoyer lift, unable to walk or stand without significant assistance, difficulty with W/C navigation..    Limitations Sitting;Lifting;Standing;House hold activities;Writing;Walking;Other (comment)    Currently in Pain? No/denies    Multiple Pain Sites No  TREATMENT: Prior to transfersand in between transfers: Patient sittingin wheelchair: -LLE LAQ 2x10 reps with visual cues for increased ROM -hip flexion march x10 reps each LE with verbal/visual cues to increase AROM;  Reinforced HEP with instruction to focus on isolating one leg at a time to help with disassociation;  Ptrequires verbal and visual cues to improve ROM;He was able to exhibit improved LLE AROM with cues; Patient also requires cues for erect posture for better trunk control;  Following exercise: Transferred sit<>Stand in parallel bars (seat adjusted to 26-27 inch from floor height) x6 reps throughout session;He was able to transfermin A to CGA and a few repetitions at close supervision,   herequiredmodVCs for forward weight shift for better trunk control and transfer ability;Patient does exhibit increased lateral lean to left side this session;  Standing in parallel bars: Standing,min Awith min VCs for weight shift and increased extension for better stance control; -lateral weight shift side/sidewith hands on rails x10 reps with min A for balance/safety; -Mini squatx10repswith CGA and min VCs for weight shift and to increase LE muscle activation; -Toe taps to 4 inch step x10 reps RLE, x5 reps LLE with min A for safety;  Patient initially exhibits flexed posture with decreased hip extension;he also exhibits heavy lean toleftside requiring instruction for weight shift to neutral position;    Educated patient and caregiver in how to work on transfer at home using sink to pull self up to standing. Patient and caregiver educated in proper positioning of chair; Patient transferred 2 reps with daughter assisting for safety, supervision from therapist; Patient and caregiver educated in ways to adjust chair position to improve safety when sitting back in chair;  Patient requiredmodA to scoot back in chair at end of session due to increased fatigue    Response to treatment: Toleratedfair. He was more fatigued this session requiring increased cues and assistance initially. Patient continues to exhibit decreased hip extension in stance with heavy posterior lean requiring min A for balance/control. He was able to exhibit better trunk control when sitting unsupported with less instability;  Patient would benefit from additional skilled PT Intervention to improve strength, balance and mobility;                         PT Education - 08/21/19 1302    Education Details transfers/positioning;    Person(s) Educated Patient    Methods Explanation;Verbal cues    Comprehension Verbalized understanding;Returned demonstration;Verbal cues required;Need  further instruction            PT Short Term Goals - 08/18/19 1305      PT SHORT TERM GOAL #1   Title Be independent with initial home exercise program for self-management of symptoms.    Baseline HEP to be given at second session; advice to practice unsupported sitting at home (06/19/2018); 07/08/18: Performing at home, 6/29: needs assistance but is doing exercise program regularly; 08/22/18: adherent;    Time 2    Period Weeks    Status Achieved    Target Date 06/26/18             PT Long Term Goals - 08/20/19 1456      PT LONG TERM GOAL #1   Title Patient will complete all bed mobility with min A to improve functional independence for getting in and out of bed and adjusting in bed.     Baseline max A - total A reported by family (06/19/2018); 07/08/18: still requires maxA,  dtr reports improvement in rolling since starting therapy; 6/23: min A to supervision in clinic; not doing at home right; 8/5: Pt's daughter states that his rolling has improved, but still has difficulty with all other bed mobility; 10/23/18: pts daughter states that she is doing about 35% of the work if he is in proper position, he does uses the bed rails to help roll;  using Harrel Lemon for coming to sit EOB; 10/12: pts daughter states that she is doing about 30% of the work if he is in proper position, he does uses the bed rails to help roll, but it is improving;  using Hoyer for coming to sit EOB, 10/29: min A for sit to sidelying, rolling supervision, able to transition sidelying to sit with HHA;    Time 12    Period Weeks    Status Achieved      PT LONG TERM GOAL #2   Title Patient will complete sit <> stand transfer chair to chair with Min A and LRAD to improve functional independence for household and community mobility.     Baseline supervision for STS, still requires elevated surface height to 27"    Time 12    Period Weeks    Status Achieved      PT LONG TERM GOAL #3   Title Patient will navigate power w/c  with min A x 100 feet to improve mobility for household and short community distances.    Baseline required total A (06/12/2018); able to roll 20 feet with min A (06/20/2018); 07/08/18: Pt can go approximately 10' without assist per family;  10/23/2018: pt's daughter states that his power WC mobility has become 100% better with the new hand control, slowed speed, and with practice; 10/12: pt's daughter reports that he can perform household WC mobility and limited community ambulation with minA    Time 12    Period Weeks    Status Achieved      PT LONG TERM GOAL #4   Title Patient will complete car transfers W/C to family SUV with min A using LRAD to improve his abilty to participate in community activities.     Baseline unable to complete car transfers at all (06/12/2018); unable to complete car transfers at all (06/19/2018).; 07/08/18: Unable to perform at this time; 09/11/18: unable to perform at this time; 10/23/2018: unable to complete car transfers at all; 10/12:  unable to complete car transfers at all, 10/29: unable; 01/13/19: unable; 2/11: min-mod A +2, 3/10: min A+1    Time 12    Period Weeks    Status Achieved      PT LONG TERM GOAL #5   Title Patient will ambulate at least 10 feet with min A using LRAD to improve mobility for household and community distances.    Baseline unable to take steps (06/12/2018); able to shift R leg forward and back with max A and RW at edge of plinth (06/19/2018); 07/08/18: unable to ambulate at this time. 09/11/18: unable to perform at this time; 10/23/2018: unable to perform at this time; 10/12: unable to perform at this time, 10/29: unable at this time; 01/13/19: unable at this time, 2/11: min A to step with RLE, unable to step with LLE, 3/10: able to take 2 steps in parallel bars with min A for safety; 4/1: min A for walking 10 feet in parallel bars, 5/24: min A walking 10 feet in parallel bars; 6/21: no change 7/14: no change    Time 12  Period Weeks    Status Partially Met     Target Date 11/12/19      PT LONG TERM GOAL #6   Title Patient will be able to safety navigate up/down ramp with power chair, exhibiting good safety awareness, mod I to safely enter/exit his home.    Baseline 2/11: supervision with intermittent min A;4/1: supervision; 5/24: supervision    Time 12    Period Weeks    Status Partially Met    Target Date 11/12/19      PT LONG TERM GOAL #7   Title Patient will increase functional reach test to >15 inches in sitting to exhibit improved sitting balance and positioning and reduce fall risk;    Baseline 07/30/18: 12 inches; 09/11/18: 10-12 inches; 10/23/18: 12-13 inch; 10/12: 14.5", 10/29: 15.5 inch    Time 4    Period Weeks    Status Achieved      PT LONG TERM GOAL #8   Title Patient will increase LLE quad strength to 3+/5 to improve functional strength for standing and mobility;    Baseline 10/28: 2+/5; 01/13/19: 2+/5, 2/11: 3-/5, 04/16/19: 3-/5, 05/08/19: 3-/5, 5/24: 3-/5, 6/21: 3-/5, 7/14: 3-/5    Time 12    Period Weeks    Status Partially Met    Target Date 11/12/19      PT LONG TERM GOAL  #9   TITLE Patient will report no vertigo with provoking motions or positions in order to be able to perform ADLs and mobility tasks without symptoms.    Baseline 06/30/19: reports no dizziness in last week; 6/21: one episode this weekend; 7/14: experiencing one a week;    Time 12    Period Weeks    Status Partially Met    Target Date 11/12/19                 Plan - 08/21/19 1500    Clinical Impression Statement Patient motivated and participated well within session. He was instructed in advanced transfers. Patient able to progress to supervision at times with transfers. Initially he was able to stand for longer period of time at start of session but then at end of session was only able to stand for 10-15 seconds. Patient fatigues quickly requiring short seated rest breaks. He and caregiver educated in ways to improve safety with transfers at  home. Patient and caregiver verbalized understanding. He would benefit from additional skilled PT intervention to improve strength, balance and mobility;    Personal Factors and Comorbidities Age;Comorbidity 3+    Comorbidities Relevant past medical history and comorbidities include long term steroid use for lupus, CKD, chronic osteomyelitis, cervical spine stenosis, BPH, APS, alcohol abuse, peripheral artery disease, depression, GERD, obstructive sleep apnea, HTN, IBS, IgA deficiency, lumbar radiculopathy, RA, Stroke, toxic maculopathy in both eyes, systemic lupus erythematosus related syndrome, cardiac catheterization, carotid endarterectomy, cervical laminectomy, coronary angioplasty, Fusion C5-C7, knee arthroplasty, lumbar surgery.    Examination-Activity Limitations Bed Mobility;Bend;Sit;Toileting;Stand;Stairs;Lift;Transfers;Squat;Locomotion Level;Carry;Dressing;Hygiene/Grooming;Continence    Examination-Participation Restrictions Yard Work;Interpersonal Relationship;Community Activity    Stability/Clinical Decision Making Evolving/Moderate complexity    Rehab Potential Fair    PT Frequency 3x / week    PT Duration 12 weeks    PT Treatment/Interventions ADLs/Self Care Home Management;Electrical Stimulation;Moist Heat;Cryotherapy;Gait training;Stair training;Functional mobility training;Therapeutic exercise;Balance training;Neuromuscular re-education;Cognitive remediation;Patient/family education;Orthotic Fit/Training;Wheelchair mobility training;Manual techniques;Passive range of motion;Energy conservation;Joint Manipulations;Canalith Repostioning;Therapeutic activities;Vestibular    PT Next Visit Plan Work on strength, sitting/standing balance, functional activities such as rolling, bed mobility, transfers; caregiver training  PT Home Exercise Plan no updates this session    Consulted and Agree with Plan of Care Patient;Family member/caregiver    Family Member Consulted Wife            Patient will benefit from skilled therapeutic intervention in order to improve the following deficits and impairments:  Abnormal gait, Decreased activity tolerance, Decreased cognition, Decreased endurance, Decreased knowledge of use of DME, Decreased range of motion, Decreased skin integrity, Decreased strength, Impaired perceived functional ability, Impaired sensation, Impaired UE functional use, Improper body mechanics, Pain, Cardiopulmonary status limiting activity, Decreased balance, Decreased coordination, Decreased mobility, Difficulty walking, Impaired tone, Postural dysfunction, Dizziness  Visit Diagnosis: Muscle weakness (generalized)  Other lack of coordination  Difficulty in walking, not elsewhere classified  Repeated falls     Problem List Patient Active Problem List   Diagnosis Date Noted  . Sepsis ([place]) 10/18/2018    Jakhari Space PT, DPT 08/21/2019, 3:56 PM  [place] MAIN [facility] SERVICES [address] [place], [place], [address] Phone: [phone]   Fax:  [phone]  Name: Kenneth Summers MRN: [id number] Date of Birth: 1945-05-30

## 2019-08-25 ENCOUNTER — Ambulatory Visit: Payer: Medicare Other | Admitting: Physical Therapy

## 2019-08-25 ENCOUNTER — Encounter: Payer: Self-pay | Admitting: Occupational Therapy

## 2019-08-25 ENCOUNTER — Other Ambulatory Visit: Payer: Self-pay

## 2019-08-25 ENCOUNTER — Encounter: Payer: Self-pay | Admitting: Physical Therapy

## 2019-08-25 ENCOUNTER — Ambulatory Visit: Payer: Medicare Other | Admitting: Occupational Therapy

## 2019-08-25 DIAGNOSIS — R296 Repeated falls: Secondary | ICD-10-CM

## 2019-08-25 DIAGNOSIS — M6281 Muscle weakness (generalized): Secondary | ICD-10-CM | POA: Diagnosis not present

## 2019-08-25 DIAGNOSIS — R278 Other lack of coordination: Secondary | ICD-10-CM

## 2019-08-25 DIAGNOSIS — R262 Difficulty in walking, not elsewhere classified: Secondary | ICD-10-CM

## 2019-08-25 NOTE — Therapy (Addendum)
[facility] MAIN [facility] SERVICES [address] [place], [place], [address] Phone: [phone]   Fax:  [phone]  Physical Therapy Treatment Physical Therapy Progress Note   Dates of reporting period  07/28/19   to   08/25/19   Patient Details  Name: Kenneth Summers MRN: [id number] Date of Birth: 1945-08-20 No data recorded  Encounter Date: 08/25/2019   PT End of Session - 08/25/19 1330    Visit Number [id number]    Number of Visits 216    Date for PT Re-Evaluation 11/12/19    Authorization Type Medicare reporting period starting 09/11/18; NO FOTO    Authorization Time Period goals last assessed 08/20/19    PT Start Time 1302    PT Stop Time 1345    PT Time Calculation (min) 43 min    Equipment Utilized During Treatment Gait belt    Activity Tolerance Patient tolerated treatment well;No increased pain    Behavior During Therapy WFL for tasks assessed/performed           Past Medical History:  Diagnosis Date  . Actinic keratosis   . Alcohol abuse   . APS (antiphospholipid syndrome) (HCC)   . Basal cell carcinoma   . BPH (benign prostatic hyperplasia)   . CAD (coronary artery disease)   . Cellulitis   . Cervical spinal stenosis   . Chronic osteomyelitis (HCC)   . CKD (chronic kidney disease)   . CKD (chronic kidney disease)   . Clostridium difficile diarrhea   . Depression   . Diplopia   . Fatigue   . GERD (gastroesophageal reflux disease)   . Hyperlipemia   . Hypertension   . Hypovitaminosis D   . IBS (irritable bowel syndrome)   . IgA deficiency (HCC)   . Insomnia   . Left lumbosacral radiculopathy   . Moderate obstructive sleep apnea   . Osteomyelitis of foot (HCC)   . PAD (peripheral artery disease) (HCC)   . Pruritus   . RA (rheumatoid arthritis) (HCC)   . Radiculopathy   . Restless legs syndrome   . RLS (restless legs syndrome)   . SLE (systemic lupus erythematosus related syndrome) (HCC)   . Squamous cell carcinoma  of skin 01/27/2019   right mid lower ear helix  . Stroke (HCC)   . Toxic maculopathy of both eyes     Past Surgical History:  Procedure Laterality Date  . CARDIAC CATHETERIZATION    . CAROTID ENDARTERECTOMY    . CERVICAL LAMINECTOMY    . CORONARY ANGIOPLASTY    . FRACTURE SURGERY    . fusion C5-6-7    . HERNIA REPAIR    . KNEE ARTHROSCOPY    . TONSILLECTOMY      There were no vitals filed for this visit.   Subjective Assessment - 08/25/19 1329    Subjective Patient reports doing well; He reports doing some standing over the weekend at the kitchen sink with good tolerance;    Patient is accompained by: Family member    Pertinent History Patient is a 74 y.o. male who presents to outpatient physical therapy with a referral for medical diagnosis of CVA. This patient's chief complaints consist of left hemiplegia and overall deconditioning leading to the following functional deficits: dependent for ADLs, IADLs, unable to transfer to car, dependent transfers at home with hoyer lift, unable to walk or stand without significant assistance, difficulty with W/C navigation..    Limitations Sitting;Lifting;Standing;House hold activities;Writing;Walking;Other (comment)    Currently  in Pain? No/denies    Multiple Pain Sites No                  TREATMENT: Prior to transfersand in between transfers: Patient sittingin wheelchair: -hip flexion march x10 reps each LE with verbal/visual cues to increase AROM; Reinforced HEP with instruction to focus on isolating one leg at a time to help with disassociation;  Ptrequires verbal and visual cues to improve ROM;He was able to exhibit improved LLE AROM with cues; Patient also requires cues for erect posture for better trunk control;  Following exercise: Transferred sit<>Stand in parallel bars (seat adjusted to 27 inch from floor height) x5 reps throughout session;He was able to transfer with close  supervision,herequiredmodVCs for forward weight shift for better trunk control and transfer ability;Patient does exhibit increased lateral lean to left side this session;  Standing in parallel bars: Standing,min Awith min VCs for weight shift and increased extension for better stance control; -Mini squatx10repswith CGA and min VCs for weight shift and to increase LE muscle activation; -standing marches x10 reps each LE, partial ROM on LLE , requiring min Af or safety with cues for weight shift and erect posture -unsupported standing x10 sec with therapist providing min A for safety;   Patient initially exhibits flexed posture with decreased hip extension;he also exhibits heavy lean torightside requiring instruction for weight shift to neutral position;    Patient requiredmodA to scoot back in chair at end of session due to increased fatigue    Response to treatment: Toleratedfair. When scooting forward for initial transfer he reports feeling increased onset of dizziness. PT assessed patient and noted nystagmus in a torsional direction to left. This last for at least 5 minutes. Patient reports feeling like the room was spinning. He denies any other symptoms. Vitals were WNL, BP 124/68, HR 70, SP02 100%  After 5 min symptoms subsided. Patient instructed in sit<>Stand transfers/standing activities. See above. He was able to exhibit better trunk control this session with less posterior lean. He still has weakness in LLE making AROM in standing difficult.  [name] condition has the potential to improve in response to therapy. Maximum improvement is yet to be obtained. The anticipated improvement is attainable and reasonable in a generally predictable time.  Patient reports adherence with HEP and states that he has been trying to stand some at home.                        PT Education - 08/25/19 1330    Education Details transfers/positioning;     Person(s) Educated Patient    Methods Explanation;Verbal cues    Comprehension Verbalized understanding;Returned demonstration;Verbal cues required;Need further instruction            PT Short Term Goals - 08/18/19 1305      PT SHORT TERM GOAL #1   Title Be independent with initial home exercise program for self-management of symptoms.    Baseline HEP to be given at second session; advice to practice unsupported sitting at home (06/19/2018); 07/08/18: Performing at home, 6/29: needs assistance but is doing exercise program regularly; 08/22/18: adherent;    Time 2    Period Weeks    Status Achieved    Target Date 06/26/18             PT Long Term Goals - 08/20/19 1456      PT LONG TERM GOAL #1   Title Patient will complete all bed mobility with min  A to improve functional independence for getting in and out of bed and adjusting in bed.     Baseline max A - total A reported by family (06/19/2018); 07/08/18: still requires maxA, dtr reports improvement in rolling since starting therapy; 6/23: min A to supervision in clinic; not doing at home right; 8/5: Pt's daughter states that his rolling has improved, but still has difficulty with all other bed mobility; 10/23/18: pts daughter states that she is doing about 35% of the work if he is in proper position, he does uses the bed rails to help roll;  using Hoyer for coming to sit EOB; 10/12: pts daughter states that she is doing about 30% of the work if he is in proper position, he does uses the bed rails to help roll, but it is improving;  using Hoyer for coming to sit EOB, 10/29: min A for sit to sidelying, rolling supervision, able to transition sidelying to sit with HHA;    Time 12    Period Weeks    Status Achieved      PT LONG TERM GOAL #2   Title Patient will complete sit <> stand transfer chair to chair with Min A and LRAD to improve functional independence for household and community mobility.     Baseline supervision for STS, still  requires elevated surface height to 27"    Time 12    Period Weeks    Status Achieved      PT LONG TERM GOAL #3   Title Patient will navigate power w/c with min A x 100 feet to improve mobility for household and short community distances.    Baseline required total A (06/12/2018); able to roll 20 feet with min A (06/20/2018); 07/08/18: Pt can go approximately 10' without assist per family;  10/23/2018: pt's daughter states that his power WC mobility has become 100% better with the new hand control, slowed speed, and with practice; 10/12: pt's daughter reports that he can perform household WC mobility and limited community ambulation with minA    Time 12    Period Weeks    Status Achieved      PT LONG TERM GOAL #4   Title Patient will complete car transfers W/C to family SUV with min A using LRAD to improve his abilty to participate in community activities.     Baseline unable to complete car transfers at all (06/12/2018); unable to complete car transfers at all (06/19/2018).; 07/08/18: Unable to perform at this time; 09/11/18: unable to perform at this time; 10/23/2018: unable to complete car transfers at all; 10/12:  unable to complete car transfers at all, 10/29: unable; 01/13/19: unable; 2/11: min-mod A +2, 3/10: min A+1    Time 12    Period Weeks    Status Achieved      PT LONG TERM GOAL #5   Title Patient will ambulate at least 10 feet with min A using LRAD to improve mobility for household and community distances.    Baseline unable to take steps (06/12/2018); able to shift R leg forward and back with max A and RW at edge of plinth (06/19/2018); 07/08/18: unable to ambulate at this time. 09/11/18: unable to perform at this time; 10/23/2018: unable to perform at this time; 10/12: unable to perform at this time, 10/29: unable at this time; 01/13/19: unable at this time, 2/11: min A to step with RLE, unable to step with LLE, 3/10: able to take 2 steps in parallel bars with min A  for safety; 4/1: min A for walking 10  feet in parallel bars, 5/24: min A walking 10 feet in parallel bars; 6/21: no change 7/14: no change    Time 12    Period Weeks    Status Partially Met    Target Date 11/12/19      PT LONG TERM GOAL #6   Title Patient will be able to safety navigate up/down ramp with power chair, exhibiting good safety awareness, mod I to safely enter/exit his home.    Baseline 2/11: supervision with intermittent min A;4/1: supervision; 5/24: supervision    Time 12    Period Weeks    Status Partially Met    Target Date 11/12/19      PT LONG TERM GOAL #7   Title Patient will increase functional reach test to >15 inches in sitting to exhibit improved sitting balance and positioning and reduce fall risk;    Baseline 07/30/18: 12 inches; 09/11/18: 10-12 inches; 10/23/18: 12-13 inch; 10/12: 14.5", 10/29: 15.5 inch    Time 4    Period Weeks    Status Achieved      PT LONG TERM GOAL #8   Title Patient will increase LLE quad strength to 3+/5 to improve functional strength for standing and mobility;    Baseline 10/28: 2+/5; 01/13/19: 2+/5, 2/11: 3-/5, 04/16/19: 3-/5, 05/08/19: 3-/5, 5/24: 3-/5, 6/21: 3-/5, 7/14: 3-/5    Time 12    Period Weeks    Status Partially Met    Target Date 11/12/19      PT LONG TERM GOAL  #9   TITLE Patient will report no vertigo with provoking motions or positions in order to be able to perform ADLs and mobility tasks without symptoms.    Baseline 06/30/19: reports no dizziness in last week; 6/21: one episode this weekend; 7/14: experiencing one a week;    Time 12    Period Weeks    Status Partially Met    Target Date 11/12/19                 Plan - 08/25/19 1440    Clinical Impression Statement Patient presents to therapy feeling well. When scooting forward for initial transfer he reports feeling increased onset of dizziness. PT assessed patient and noted nystagmus in a torsional direction to left. This last for at least 5 minutes. Patient reports feeling like the room was  spinning. He denies any other symptoms. Vitals were WNL. [name] symptoms did subside and treatment continued. He was able to stand from 27 inch height surface with supervision with better trunk control and weight shift. He does fatigue quickly with prolonged standing. patient would benefit from additional skilled PT intervention to improve strength, balance and mobility;    Personal Factors and Comorbidities Age;Comorbidity 3+    Comorbidities Relevant past medical history and comorbidities include long term steroid use for lupus, CKD, chronic osteomyelitis, cervical spine stenosis, BPH, APS, alcohol abuse, peripheral artery disease, depression, GERD, obstructive sleep apnea, HTN, IBS, IgA deficiency, lumbar radiculopathy, RA, Stroke, toxic maculopathy in both eyes, systemic lupus erythematosus related syndrome, cardiac catheterization, carotid endarterectomy, cervical laminectomy, coronary angioplasty, Fusion C5-C7, knee arthroplasty, lumbar surgery.    Examination-Activity Limitations Bed Mobility;Bend;Sit;Toileting;Stand;Stairs;Lift;Transfers;Squat;Locomotion Level;Carry;Dressing;Hygiene/Grooming;Continence    Examination-Participation Restrictions Yard Work;Interpersonal Relationship;Community Activity    Stability/Clinical Decision Making Evolving/Moderate complexity    Rehab Potential Fair    PT Frequency 3x / week    PT Duration 12 weeks    PT Treatment/Interventions ADLs/Self Care Home Management;Electrical Stimulation;Moist  Heat;Cryotherapy;Gait training;Stair training;Functional mobility training;Therapeutic exercise;Balance training;Neuromuscular re-education;Cognitive remediation;Patient/family education;Orthotic Fit/Training;Wheelchair mobility training;Manual techniques;Passive range of motion;Energy conservation;Joint Manipulations;Canalith Repostioning;Therapeutic activities;Vestibular    PT Next Visit Plan Work on strength, sitting/standing balance, functional activities such as  rolling, bed mobility, transfers; caregiver training    PT Home Exercise Plan no updates this session    Consulted and Agree with Plan of Care Patient;Family member/caregiver    Family Member Consulted Wife           Patient will benefit from skilled therapeutic intervention in order to improve the following deficits and impairments:  Abnormal gait, Decreased activity tolerance, Decreased cognition, Decreased endurance, Decreased knowledge of use of DME, Decreased range of motion, Decreased skin integrity, Decreased strength, Impaired perceived functional ability, Impaired sensation, Impaired UE functional use, Improper body mechanics, Pain, Cardiopulmonary status limiting activity, Decreased balance, Decreased coordination, Decreased mobility, Difficulty walking, Impaired tone, Postural dysfunction, Dizziness  Visit Diagnosis: Muscle weakness (generalized)  Other lack of coordination  Difficulty in walking, not elsewhere classified  Repeated falls     Problem List Patient Active Problem List   Diagnosis Date Noted  . Sepsis ([place]) 10/18/2018    Dajanee Voorheis  PT, DPT 08/25/2019, 2:43 PM  [place] MAIN [facility] SERVICES [address] [place], [place], [address] Phone: [phone]   Fax:  [phone]  Name: SHAHEER BONFIELD MRN: [id number] Date of Birth: 24-Sep-1945

## 2019-08-25 NOTE — Therapy (Signed)
[place] MAIN [facility] SERVICES [address] [place], [place], [address] Phone: [phone]   Fax:  [phone]  Occupational Therapy Treatment  Patient Details  Name: Kenneth Summers MRN: [id number] Date of Birth: May 06, 1945 No data recorded  Encounter Date: 08/25/2019   OT End of Session - 08/25/19 1402    Visit Number 129    Number of Visits 157    Date for OT Re-Evaluation 09/15/19    Authorization Type Progress report period starting 07/21/2019    OT Start Time 1345    OT Stop Time 1430    OT Time Calculation (min) 45 min    Activity Tolerance Patient tolerated treatment well    Behavior During Therapy [facility] for tasks assessed/performed           Past Medical History:  Diagnosis Date  . Actinic keratosis   . Alcohol abuse   . APS (antiphospholipid syndrome) ([place])   . Basal cell carcinoma   . BPH (benign prostatic hyperplasia)   . CAD (coronary artery disease)   . Cellulitis   . Cervical spinal stenosis   . Chronic osteomyelitis ([place])   . CKD (chronic kidney disease)   . CKD (chronic kidney disease)   . Clostridium difficile diarrhea   . Depression   . Diplopia   . Fatigue   . GERD (gastroesophageal reflux disease)   . Hyperlipemia   . Hypertension   . Hypovitaminosis D   . IBS (irritable bowel syndrome)   . 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 Insomnia   . Left lumbosacral radiculopathy   . Moderate obstructive sleep apnea   . Osteomyelitis of foot ([place])   . PAD (peripheral artery disease) ([place])   . Pruritus   . RA (rheumatoid arthritis) ([place])   . Radiculopathy   . Restless legs syndrome   . RLS (restless legs syndrome)   . SLE (systemic lupus erythematosus related syndrome) ([place])   . 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 Stroke ([place])   . Toxic maculopathy of both eyes     Past Surgical History:  Procedure Laterality Date  . CARDIAC CATHETERIZATION    . CAROTID ENDARTERECTOMY    .  CERVICAL LAMINECTOMY    . CORONARY ANGIOPLASTY    . FRACTURE SURGERY    . fusion C5-6-7    . HERNIA REPAIR    . KNEE ARTHROSCOPY    . TONSILLECTOMY      There were no vitals filed for this visit.   Subjective Assessment - 08/25/19 1359    Subjective  Pt. reports that he is having a good day.    Patient is accompanied by: Family member    Pertinent History Pt. is a 74 y.o. 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 Pt. PMHx includes: Multiple Falls, Lupus, DJD, Renal Abscess, CVA. Pt. resides with his wife. Pt.'s wife and daughter assist with ADLs. Pt. has caregivers in  for 2 hours a day, 6 days a week. Pt. received Rehab services in acute care, at SNF for STR, and [place] services. Pt. is retired from [employer] for [employer] and [employer].    Patient Stated Goals To regain use of his left UE, and do more for himself.    Currently in Pain? No/denies          OTTREATMENT   Therapeutic Activities:  Pt. worked onusing his left hand forgrasping,and placing  2"large pegs on the Instructo board placed at a tabletop surface. Pt.worked on following [employer] patterns. Pt. requiredmore cuing to follow the design pattern, use compensatory strategies, and place the pegs in the accurate spot on the boardfor more complex design patterns. Pt. required increased time to complete a design of moderate complexity. Pt. required increased cues to use his left hand. At times pt. would move the pegs from one section to another section of the design instead of retrieving it a new one from the container.  Pt. continues to present withimproved attention, however required assist forleft sided awareness,and for accuracy with following the design pattern. Pt. required minimalcues to use his left hand, and moderatecuesfor accuracy followingcomplexdesign patterns. With cues pt. was able to make corrections with the design patterns. Pt. had difficulty  with consistently using his left UE as well as  forward flexing at his trunk at the same time. Pt. continues to work on these skills in order to work towards increasing engagement of the LUE during ADLs, and IADL tasks.Pt.continues to work on improving LUE functioning in order to work towards improving overall independence with ADLs, and IADLs.                       OT Education - 08/25/19 1401    Education Details Design patterns, left sided awarenss.    Person(s) Educated Patient    Methods Explanation;Demonstration    Comprehension Returned demonstration;Tactile cues required;Verbalized understanding;Need further instruction               OT Long Term Goals - 07/23/19 0826      OT LONG TERM GOAL #1   Title Pt. will increase left shoulder flexion AROM by 10 degrees to access cabinet/shelf.    Baseline Shoulder flexion:130. Pt. is progressing, and continues to work on reaching with increasing emphasis on flexing trunk to improve functional reach.  07/21/19- shoulder flexion to 130 with effort    Time 12    Period Weeks    Status Partially Met    Target Date 09/15/19      OT LONG TERM GOAL #2   Title Pt. will donn a shirt with Supervision.    Baseline Pt. continues to require MinA donning a zip down jacket with consistent cues to initiate the correct direction of the jacket.  07/21/19 continues to require min assist at times for shirt/jacket    Time 12    Status On-going    Target Date 09/15/19      OT LONG TERM GOAL #4   Title Pt. will improve left grip strength by 10# to be able to open a jar/container.    Baseline Pt. continues to work towards opening containers, and bottles.    Time 12    Period Weeks    Status On-going    Target Date 09/15/19      OT LONG TERM GOAL #5   Title Pt. will improve left hand [facility] skills to be able to assist with buttoning/zipping.    Baseline Pt. is improving with manipulating the zipper on his jacket, however continues  to require practice. Pt. continues to work on improving bilateral [facility] skills for buttoning/zipping.  07/21/19 improving with buttons and zippers.    Time 12    Period Weeks    Status On-going    Target Date 09/15/19      OT LONG TERM GOAL #6   Title Pt. will demonstrate visual conmpensatory strategies for 100%  of the time during ADLs    Baseline Pt. requires  fewer cues for left sides awareness.    Time 12    Period Weeks    Status Partially Met    Target Date 09/15/19      OT LONG TERM GOAL #8   Title Pt. will accurately identify potential safety hazard using good safety awareness, and judgement 100% for ADLs, and IADLs.    Baseline Pt. requires cues    Time 12    Period Weeks    Status On-going    Target Date 09/15/19      OT LONG TERM GOAL  #9   TITLE Pt. will  navigate his w/c around obstacles with Supervision and 100% accuracy    Baseline Pt. continues to require cuing for obstacle on the left, however requires less cues. 07/21/2019 continues to improve with decreased assist at times    Time 12    Period Weeks    Status On-going    Target Date 09/15/19      OT LONG TERM GOAL  #11   TITLE Pt. will increase BUE strength by 46m grades in preparation for ADL transfers.    Baseline , 07/21/19 continues to work towards improved strength, LUE 3-/5    Time 12    Period Weeks    Status On-going    Target Date 09/15/19      OT LONG TERM GOAL  #12   TITLE Pt. will improve LUE functional reaching with the left engaging the trunk 100% of the time during ADL tasks with minimal cues.    Baseline 07/21/2019: Pt. is improving with LUE functional reachin, howwver requires cues to engage trunk for extended reaching.    Time 12    Period Weeks    Status On-going    Target Date 09/15/19                 Plan - 08/25/19 1403    Clinical Impression Statement Pt. continues to present withimproved attention, however required assist forleft sided awareness,and for accuracy with  following the design pattern. Pt. required minimalcues to use his left hand, and moderatecuesfor accuracy followingcomplexdesign patterns. With cues pt. was able to make corrections with the design patterns. Pt. had difficulty with consistently using his left UE as well as  forward flexing at his trunk at the same time. Pt. continues to work on these skills in order to work towards increasing engagement of the LUE during ADLs, and IADL tasks.Pt.continues to work on improving LUE functioning in order to work towards improving overall independence with ADLs, and IADLs.   OT Occupational Profile and History Problem Focused Assessment - Including review of records relating to presenting problem    Occupational performance deficits (Please refer to evaluation for details): ADL's    Body Structure / Function / Physical Skills UE functional use;Coordination;FMC;Dexterity;Strength;ROM    Cognitive Skills Attention;Memory;Emotional;Problem Solve;Safety Awareness    Rehab Potential Good    Clinical Decision Making Several treatment options, min-mod task modification necessary    Comorbidities Affecting Occupational Performance: Presence of comorbidities impacting occupational performance    Comorbidities impacting occupational performance description: Phyical, cognitive, visual,  medical comorbidities    Modification or Assistance to Complete Evaluation  No modification of tasks or assist necessary to complete eval    OT Frequency 2x / week    OT Duration 12 weeks    OT Treatment/Interventions Self-care/ADL training;DME and/or AE instruction;Therapeutic exercise;Therapeutic activities;Moist Heat;Cognitive remediation/compensation;Neuromuscular education;Visual/perceptual remediation/compensation;Coping strategies training;Patient/family education;Passive  range of motion;Psychosocial skills training;Energy conservation;Functional Mobility Training           Patient will benefit from skilled  therapeutic intervention in order to improve the following deficits and impairments:   Body Structure / Function / Physical Skills: UE functional use, Coordination, FMC, Dexterity, Strength, ROM Cognitive Skills: Attention, Memory, Emotional, Problem Solve, Safety Awareness     Visit Diagnosis: Muscle weakness (generalized)  Other lack of coordination    Problem List Patient Active Problem List   Diagnosis Date Noted  . Sepsis ([place]) 10/18/2018    Harrel Carina, MS, OTR/L 08/25/2019, 2:19 PM  [place] MAIN [facility] SERVICES [address] [place], [place], [address] Phone: [phone]   Fax:  [phone]  Name: Kenneth Summers MRN: [id number] Date of Birth: [birth year]

## 2019-08-27 ENCOUNTER — Other Ambulatory Visit: Payer: Self-pay

## 2019-08-27 ENCOUNTER — Encounter: Payer: Self-pay | Admitting: Physical Therapy

## 2019-08-27 ENCOUNTER — Ambulatory Visit: Payer: Medicare Other | Admitting: Physical Therapy

## 2019-08-27 DIAGNOSIS — R278 Other lack of coordination: Secondary | ICD-10-CM

## 2019-08-27 DIAGNOSIS — M6281 Muscle weakness (generalized): Secondary | ICD-10-CM

## 2019-08-27 DIAGNOSIS — R262 Difficulty in walking, not elsewhere classified: Secondary | ICD-10-CM

## 2019-08-27 DIAGNOSIS — R296 Repeated falls: Secondary | ICD-10-CM

## 2019-08-27 NOTE — Therapy (Signed)
[place] MAIN [facility] SERVICES [address] [place], [place], [address] Phone: [phone]   Fax:  [phone]  Physical Therapy Treatment  Patient Details  Name: CHERRY WITTWER MRN: [id number] Date of Birth: [birth year] No data recorded  Encounter Date: 08/27/2019   PT End of Session - 08/27/19 1317    Visit Number [id number]    Number of Visits 216    Date for PT Re-Evaluation 11/12/19    Authorization Type Medicare reporting period starting 09/11/18; NO FOTO    Authorization Time Period goals last assessed 08/20/19    PT Start Time 1302    PT Stop Time 1345    PT Time Calculation (min) 43 min    Equipment Utilized During Treatment Gait belt    Activity Tolerance Patient tolerated treatment well;No increased pain    Behavior During Therapy WFL for tasks assessed/performed           Past Medical History:  Diagnosis Date  . Actinic keratosis   . Alcohol abuse   . APS (antiphospholipid syndrome) ([place])   . Basal cell carcinoma   . BPH (benign prostatic hyperplasia)   . CAD (coronary artery disease)   . Cellulitis   . Cervical spinal stenosis   . Chronic osteomyelitis ([place])   . CKD (chronic kidney disease)   . CKD (chronic kidney disease)   . Clostridium difficile diarrhea   . Depression   . Diplopia   . Fatigue   . GERD (gastroesophageal reflux disease)   . Hyperlipemia   . Hypertension   . Hypovitaminosis D   . IBS (irritable bowel syndrome)   . 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 Insomnia   . Left lumbosacral radiculopathy   . Moderate obstructive sleep apnea   . Osteomyelitis of foot ([place])   . PAD (peripheral artery disease) ([place])   . Pruritus   . RA (rheumatoid arthritis) ([place])   . Radiculopathy   . Restless legs syndrome   . RLS (restless legs syndrome)   . SLE (systemic lupus erythematosus related syndrome) ([place])   . 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 Stroke ([place])   . Toxic maculopathy of  both eyes     Past Surgical History:  Procedure Laterality Date  . CARDIAC CATHETERIZATION    . CAROTID ENDARTERECTOMY    . CERVICAL LAMINECTOMY    . CORONARY ANGIOPLASTY    . FRACTURE SURGERY    . fusion C5-6-7    . HERNIA REPAIR    . KNEE ARTHROSCOPY    . TONSILLECTOMY      There were no vitals filed for this visit.   Subjective Assessment - 08/27/19 1316    Subjective Patient reports increased fatigue today; He presents to therapy with heavy lean to left side sitting in power chair; Denies any dizziness;    Patient is accompained by: Family member    Pertinent History Patient is a 74 y.o. male who presents to outpatient physical therapy with a referral for medical diagnosis of CVA. This patient's chief complaints consist of left hemiplegia and overall deconditioning leading to the following functional deficits: dependent for ADLs, IADLs, unable to transfer to car, dependent transfers at home with hoyer lift, unable to walk or stand without significant assistance, difficulty with W/C navigation..    Limitations Sitting;Lifting;Standing;House hold activities;Writing;Walking;Other (comment)    Currently in Pain? No/denies    Multiple Pain Sites No  TREATMENT: Prior to transfersand in between transfers: Patient sittingin wheelchair: -hip flexion march 2x10 reps each LE with verbal/visual cues to increase AROM; -LLE only LAQ 2x10 -sitting neutral posture deep breathing x5 reps;  Reinforced HEP with instruction to focus on isolating one leg at a time to help with disassociation;also reinstructed patient in neutral sitting posture to avoid left lateral lean;   Ptrequires verbal and visual cues to improve ROM;He was able to exhibit improved LLE AROM with cues; Patient also requires cues for erect posture for better trunk control;  Following exercise: Transferred sit<>Stand in parallel bars (seat adjusted to 27 inch from floor height) x6 reps  throughout session;He was able to transfer with min A to close supervision,herequiredmodVCs for forward weight shift for better trunk control and transfer ability;Patient does exhibit increased lateral lean to left side this session;  Standing in parallel bars: Standing,min Awith min VCs for weight shift and increased extension for better stance control; -Mini squat2x10repswith CGA and min VCs for weight shift and to increase LE muscle activation; -side/side weight shift x10 reps with B rail assist, min A for safety;  -unsupported standing x10 sec with therapist providing min A for safety;   Patient initially exhibits flexed posture with decreased hip extension;he also exhibits heavy lean toleftside requiring instruction for weight shift to neutral position;   Attempted sit<>Stand transfer with RW in parallel bars but patient unable due to heavy posterior lean due to fatigue;   Patient requiredmodA to scoot back in chair at end of session due to increased fatigue    Response to treatment: Patient tolerated session well. He was able to progress with better trunk control and neutral weight shift with increased activity. He also exhibits improved LLE AROM with increased activity. Patient does report increased fatigue at end of session. During session he was panting, vitals assessed, SPo2 100%, HR 90;                       PT Education - 08/27/19 1317    Education Details transfers/positioning    Person(s) Educated Patient    Methods Explanation;Verbal cues    Comprehension Verbalized understanding;Returned demonstration;Verbal cues required;Need further instruction            PT Short Term Goals - 08/18/19 1305      PT SHORT TERM GOAL #1   Title Be independent with initial home exercise program for self-management of symptoms.    Baseline HEP to be given at second session; advice to practice unsupported sitting at home (06/19/2018); 07/08/18:  Performing at home, 6/29: needs assistance but is doing exercise program regularly; 08/22/18: adherent;    Time 2    Period Weeks    Status Achieved    Target Date 06/26/18             PT Long Term Goals - 08/20/19 1456      PT LONG TERM GOAL #1   Title Patient will complete all bed mobility with min A to improve functional independence for getting in and out of bed and adjusting in bed.     Baseline max A - total A reported by family (06/19/2018); 07/08/18: still requires maxA, dtr reports improvement in rolling since starting therapy; 6/23: min A to supervision in clinic; not doing at home right; 8/5: Pt's daughter states that his rolling has improved, but still has difficulty with all other bed mobility; 10/23/18: pts daughter states that she is doing about 35% of the work  if he is in proper position, he does uses the bed rails to help roll;  using Harrel Lemon for coming to sit EOB; 10/12: pts daughter states that she is doing about 30% of the work if he is in proper position, he does uses the bed rails to help roll, but it is improving;  using Hoyer for coming to sit EOB, 10/29: min A for sit to sidelying, rolling supervision, able to transition sidelying to sit with HHA;    Time 12    Period Weeks    Status Achieved      PT LONG TERM GOAL #2   Title Patient will complete sit <> stand transfer chair to chair with Min A and LRAD to improve functional independence for household and community mobility.     Baseline supervision for STS, still requires elevated surface height to 27"    Time 12    Period Weeks    Status Achieved      PT LONG TERM GOAL #3   Title Patient will navigate power w/c with min A x 100 feet to improve mobility for household and short community distances.    Baseline required total A (06/12/2018); able to roll 20 feet with min A (06/20/2018); 07/08/18: Pt can go approximately 10' without assist per family;  10/23/2018: pt's daughter states that his power WC mobility has become 100%  better with the new hand control, slowed speed, and with practice; 10/12: pt's daughter reports that he can perform household WC mobility and limited community ambulation with minA    Time 12    Period Weeks    Status Achieved      PT LONG TERM GOAL #4   Title Patient will complete car transfers W/C to family SUV with min A using LRAD to improve his abilty to participate in community activities.     Baseline unable to complete car transfers at all (06/12/2018); unable to complete car transfers at all (06/19/2018).; 07/08/18: Unable to perform at this time; 09/11/18: unable to perform at this time; 10/23/2018: unable to complete car transfers at all; 10/12:  unable to complete car transfers at all, 10/29: unable; 01/13/19: unable; 2/11: min-mod A +2, 3/10: min A+1    Time 12    Period Weeks    Status Achieved      PT LONG TERM GOAL #5   Title Patient will ambulate at least 10 feet with min A using LRAD to improve mobility for household and community distances.    Baseline unable to take steps (06/12/2018); able to shift R leg forward and back with max A and RW at edge of plinth (06/19/2018); 07/08/18: unable to ambulate at this time. 09/11/18: unable to perform at this time; 10/23/2018: unable to perform at this time; 10/12: unable to perform at this time, 10/29: unable at this time; 01/13/19: unable at this time, 2/11: min A to step with RLE, unable to step with LLE, 3/10: able to take 2 steps in parallel bars with min A for safety; 4/1: min A for walking 10 feet in parallel bars, 5/24: min A walking 10 feet in parallel bars; 6/21: no change 7/14: no change    Time 12    Period Weeks    Status Partially Met    Target Date 11/12/19      PT LONG TERM GOAL #6   Title Patient will be able to safety navigate up/down ramp with power chair, exhibiting good safety awareness, mod I to safely enter/exit his home.  Baseline 2/11: supervision with intermittent min A;4/1: supervision; 5/24: supervision    Time 12     Period Weeks    Status Partially Met    Target Date 11/12/19      PT LONG TERM GOAL #7   Title Patient will increase functional reach test to >15 inches in sitting to exhibit improved sitting balance and positioning and reduce fall risk;    Baseline 07/30/18: 12 inches; 09/11/18: 10-12 inches; 10/23/18: 12-13 inch; 10/12: 14.5", 10/29: 15.5 inch    Time 4    Period Weeks    Status Achieved      PT LONG TERM GOAL #8   Title Patient will increase LLE quad strength to 3+/5 to improve functional strength for standing and mobility;    Baseline 10/28: 2+/5; 01/13/19: 2+/5, 2/11: 3-/5, 04/16/19: 3-/5, 05/08/19: 3-/5, 5/24: 3-/5, 6/21: 3-/5, 7/14: 3-/5    Time 12    Period Weeks    Status Partially Met    Target Date 11/12/19      PT LONG TERM GOAL  #9   TITLE Patient will report no vertigo with provoking motions or positions in order to be able to perform ADLs and mobility tasks without symptoms.    Baseline 06/30/19: reports no dizziness in last week; 6/21: one episode this weekend; 7/14: experiencing one a week;    Time 12    Period Weeks    Status Partially Met    Target Date 11/12/19                 Plan - 08/27/19 1552    Clinical Impression Statement Patient motivated and participated well within session. He does exhibit increased left lateral trunk lean initially requiring min A for sit<>Stand transfers. However with increased exercise and mobility in session he was able to progress to close supervision requiring mod VCs for forward weight shift for better transfer ability. Patient does fatigue quickly requiring short seated rest break. He was able to exhibit better LLE AROM during session with LE exercise. He would benefit from additional skilled PT intervention to improve strength, balance and mobility;    Personal Factors and Comorbidities Age;Comorbidity 3+    Comorbidities Relevant past medical history and comorbidities include long term steroid use for lupus, CKD, chronic  osteomyelitis, cervical spine stenosis, BPH, APS, alcohol abuse, peripheral artery disease, depression, GERD, obstructive sleep apnea, HTN, IBS, IgA deficiency, lumbar radiculopathy, RA, Stroke, toxic maculopathy in both eyes, systemic lupus erythematosus related syndrome, cardiac catheterization, carotid endarterectomy, cervical laminectomy, coronary angioplasty, Fusion C5-C7, knee arthroplasty, lumbar surgery.    Examination-Activity Limitations Bed Mobility;Bend;Sit;Toileting;Stand;Stairs;Lift;Transfers;Squat;Locomotion Level;Carry;Dressing;Hygiene/Grooming;Continence    Examination-Participation Restrictions Yard Work;Interpersonal Relationship;Community Activity    Stability/Clinical Decision Making Evolving/Moderate complexity    Rehab Potential Fair    PT Frequency 3x / week    PT Duration 12 weeks    PT Treatment/Interventions ADLs/Self Care Home Management;Electrical Stimulation;Moist Heat;Cryotherapy;Gait training;Stair training;Functional mobility training;Therapeutic exercise;Balance training;Neuromuscular re-education;Cognitive remediation;Patient/family education;Orthotic Fit/Training;Wheelchair mobility training;Manual techniques;Passive range of motion;Energy conservation;Joint Manipulations;Canalith Repostioning;Therapeutic activities;Vestibular    PT Next Visit Plan Work on strength, sitting/standing balance, functional activities such as rolling, bed mobility, transfers; caregiver training    PT Home Exercise Plan no updates this session    Consulted and Agree with Plan of Care Patient;Family member/caregiver    Family Member Consulted Wife           Patient will benefit from skilled therapeutic intervention in order to improve the following deficits and impairments:  Abnormal gait, Decreased activity  tolerance, Decreased cognition, Decreased endurance, Decreased knowledge of use of DME, Decreased range of motion, Decreased skin integrity, Decreased strength, Impaired perceived  functional ability, Impaired sensation, Impaired UE functional use, Improper body mechanics, Pain, Cardiopulmonary status limiting activity, Decreased balance, Decreased coordination, Decreased mobility, Difficulty walking, Impaired tone, Postural dysfunction, Dizziness  Visit Diagnosis: Muscle weakness (generalized)  Other lack of coordination  Difficulty in walking, not elsewhere classified  Repeated falls     Problem List Patient Active Problem List   Diagnosis Date Noted  . Sepsis ([place]) 10/18/2018    Paighton Godette PT, DPT 08/27/2019, 3:54 PM  [place] MAIN [facility] SERVICES [address] [place], [place], [address] Phone: [phone]   Fax:  [phone]  Name: ZEDEKIAH HINDERMAN MRN: [id number] Date of Birth: 08-Jul-1945

## 2019-09-01 ENCOUNTER — Encounter: Payer: Self-pay | Admitting: Occupational Therapy

## 2019-09-01 ENCOUNTER — Encounter: Payer: Self-pay | Admitting: Physical Therapy

## 2019-09-01 ENCOUNTER — Ambulatory Visit: Payer: Medicare Other | Admitting: Occupational Therapy

## 2019-09-01 ENCOUNTER — Other Ambulatory Visit: Payer: Self-pay

## 2019-09-01 ENCOUNTER — Ambulatory Visit: Payer: Medicare Other | Admitting: Physical Therapy

## 2019-09-01 DIAGNOSIS — R262 Difficulty in walking, not elsewhere classified: Secondary | ICD-10-CM

## 2019-09-01 DIAGNOSIS — R278 Other lack of coordination: Secondary | ICD-10-CM

## 2019-09-01 DIAGNOSIS — M6281 Muscle weakness (generalized): Secondary | ICD-10-CM

## 2019-09-01 DIAGNOSIS — R296 Repeated falls: Secondary | ICD-10-CM

## 2019-09-01 NOTE — Therapy (Signed)
[place] MAIN [facility] SERVICES [address] [place], [place], [address] Phone: [phone]   Fax:  [phone]  Occupational Therapy Progress Note  Dates of reporting period  07/21/2019   to   09/01/2019  Patient Details  Name: Kenneth Summers MRN: [id number] Date of Birth: December 30, 1945 No data recorded  Encounter Date: 09/01/2019   OT End of Session - 09/01/19 1407    Visit Number 130    Number of Visits 157    Date for OT Re-Evaluation 09/15/19    Authorization Type Progress report period starting 07/21/2019    OT Start Time 1350    OT Stop Time 1430    OT Time Calculation (min) 40 min    Activity Tolerance Patient tolerated treatment well    Behavior During Therapy [facility] for tasks assessed/performed           Past Medical History:  Diagnosis Date  . Actinic keratosis   . Alcohol abuse   . APS (antiphospholipid syndrome) ([place])   . Basal cell carcinoma   . BPH (benign prostatic hyperplasia)   . CAD (coronary artery disease)   . Cellulitis   . Cervical spinal stenosis   . Chronic osteomyelitis ([place])   . CKD (chronic kidney disease)   . CKD (chronic kidney disease)   . Clostridium difficile diarrhea   . Depression   . Diplopia   . Fatigue   . GERD (gastroesophageal reflux disease)   . Hyperlipemia   . Hypertension   . Hypovitaminosis D   . IBS (irritable bowel syndrome)   . 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 Insomnia   . Left lumbosacral radiculopathy   . Moderate obstructive sleep apnea   . Osteomyelitis of foot ([place])   . PAD (peripheral artery disease) ([place])   . Pruritus   . RA (rheumatoid arthritis) ([place])   . Radiculopathy   . Restless legs syndrome   . RLS (restless legs syndrome)   . SLE (systemic lupus erythematosus related syndrome) ([place])   . 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 Stroke ([place])   . Toxic maculopathy of both eyes     Past Surgical History:  Procedure Laterality Date   . CARDIAC CATHETERIZATION    . CAROTID ENDARTERECTOMY    . CERVICAL LAMINECTOMY    . CORONARY ANGIOPLASTY    . FRACTURE SURGERY    . fusion C5-6-7    . HERNIA REPAIR    . KNEE ARTHROSCOPY    . TONSILLECTOMY      There were no vitals filed for this visit.   Subjective Assessment - 09/01/19 1404    Subjective  Pt. reported watching the Olympics over the weekend.   Patient is accompanied by: Family member    Pertinent History Pt. is a 74 y.o. 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 Pt. PMHx includes: Multiple Falls, Lupus, DJD, Renal Abscess, CVA. Pt. resides with his wife. Pt.'s wife and daughter assist with ADLs. Pt. has caregivers in  for 2 hours a day, 6 days a week. Pt. received Rehab services in acute care, at SNF for STR, and [facility] services. Pt. is retired from [employer] for [employer] and [employer].    Currently in Pain? No/denies         OT Treatment:  Therapeutic Ex:  Pt. worked on Fish farm manager, LUE awareness, and left hand [facility] skills needed to  accurately complete a complex [employer] design. Pt. required increased time, and verbal cues for shape position, placement on the design board, and the use of his left hand during the task.  Pt. continues to require assist with ADLs, and IADL tasks. Pt. required increased cues, and assist to complete the [place] design pattern. Pt. began the task requiring few cues for the right side of the task, and required more cuing as the task progressed with increased cues required for the left side of the task.  Pt. required increased cues for shape positionthe  more pieces were completed on the puzzle. Pt. required step by step cues to complete with increased cues for shape position, and placement on the design pattern. No reclining rest breaks were required.                        OT Education - 09/01/19 1406    Education Details Design patterns, left sided awarenss.     Person(s) Educated Patient    Methods Explanation;Demonstration    Comprehension Returned demonstration;Tactile cues required;Verbalized understanding;Need further instruction               OT Long Term Goals - 09/01/19 1438      OT LONG TERM GOAL #1   Title Pt. will increase left shoulder flexion AROM by 10 degrees to access cabinet/shelf.    Baseline Pt. continues to progress with shoulder ROM. Marland KitchenShouler flexion 130, and continues to work on reaching with increasing emphasis on flexing trunk to improve functional reach.  07/21/19- shoulder flexion to 130 with effort    Time 12    Period Weeks    Status Partially Met    Target Date 09/15/19      OT LONG TERM GOAL #2   Title Pt. will donn a shirt with Supervision.    Baseline Pt. continues to require MinA donning a zip down jacket with consistent cues to initiate the correct direction of the jacket.  07/21/19 continues to require min assist at times for shirt/jacket    Time 12    Period Weeks    Status On-going    Target Date 09/15/19      OT LONG TERM GOAL #3   Title Pt. will require ModA to perform LE dressing    Baseline MaxA left, ModA with the right. Pt. has sores on his ankle, and feet. Pt.'s daughter is assisting.    Time 12    Period Weeks    Status Deferred      OT LONG TERM GOAL #4   Title Pt. will improve left grip strength by 10# to be able to open a jar/container.    Baseline Pt. conitnues to present with limited left hand grip strength. Pt. continues to work towards opening containers, and bottles.    Time 12    Period Weeks    Status On-going    Target Date 09/15/19      OT LONG TERM GOAL #5   Title Pt. will improve left hand [facility] skills to be able to assist with buttoning/zipping.    Baseline Pt. is improving with manipulating the zipper on his jacket, however continues to require practice. Pt. continues to work on improving bilateral [address] skills for buttoning/zipping.  07/21/19 improving with buttons and  zippers.    Time 12    Period Weeks    Status On-going    Target Date 09/15/19      OT LONG TERM GOAL #6  Title Pt. will demonstrate visual conmpensatory strategies for 100% of the time during ADLs    Baseline Pt. requires  fewer cues for left sides awareness.    Time 12    Period Weeks    Status Partially Met    Target Date 09/15/19      OT LONG TERM GOAL #8   Title Pt. will accurately identify potential safety hazard using good safety awareness, and judgement 100% for ADLs, and IADLs.    Baseline Pt. requires cues    Time 12    Period Weeks    Status On-going    Target Date 09/15/19      OT LONG TERM GOAL  #9   TITLE Pt. will  navigate his w/c around obstacles with Supervision and 100% accuracy    Baseline Pt. continues to require cuing for obstacles on the left, however requires less cues overall. Pt. requires significant cues to engage his left UE during tasks. 07/21/2019 continues to improve with decreased assist at times    Time 12    Period Weeks    Status On-going    Target Date 09/15/19      OT LONG TERM GOAL  #11   TITLE Pt. will increase BUE strength by 43m grades in preparation for ADL transfers.    Baseline Pt. continues to work towards improving overall strength,    Time 12    Period Weeks    Status On-going    Target Date 09/15/19      OT LONG TERM GOAL  #12   TITLE Pt. will improve LUE functional reaching with the left engaging the trunk 100% of the time during ADL tasks with minimal cues.    Baseline 07/21/2019: Pt. is improving with LUE functional reaching. Pt. conitnues to require cues to engage his trunk for extended reaching.    Time 12    Period Weeks    Status On-going    Target Date 09/15/19                 Plan - 09/01/19 1407    Clinical Impression Statement Pt. continues to require assist with ADLs, and IADL tasks. Pt. required increased cues, and assist to complete the [place] design pattern. Pt. began the task requiring few cues for  the right side of the task, and required more cuing as the task progressed with increased cues required for the left side of the task.  Pt. required increased cues for shape positionthe  more pieces were completed on the puzzle. Pt. required step by step cues to complete with increased cues for shape position, and placement on the design pattern. No reclining rest breaks were required.    OT Occupational Profile and History Problem Focused Assessment - Including review of records relating to presenting problem    Occupational performance deficits (Please refer to evaluation for details): ADL's    Body Structure / Function / Physical Skills UE functional use;Coordination;FMC;Dexterity;Strength;ROM    Cognitive Skills Attention;Memory;Emotional;Problem Solve;Safety Awareness    Rehab Potential Good    Clinical Decision Making Several treatment options, min-mod task modification necessary    Comorbidities Affecting Occupational Performance: Presence of comorbidities impacting occupational performance    Comorbidities impacting occupational performance description: Phyical, cognitive, visual,  medical comorbidities    Modification or Assistance to Complete Evaluation  No modification of tasks or assist necessary to complete eval    OT Frequency 2x / week    OT Duration 12 weeks    OT Treatment/Interventions  Self-care/ADL training;DME and/or AE instruction;Therapeutic exercise;Therapeutic activities;Moist Heat;Cognitive remediation/compensation;Neuromuscular education;Visual/perceptual remediation/compensation;Coping strategies training;Patient/family education;Passive range of motion;Psychosocial skills training;Energy conservation;Functional Mobility Training    Consulted and Agree with Plan of Care Patient           Patient will benefit from skilled therapeutic intervention in order to improve the following deficits and impairments:   Body Structure / Function / Physical Skills: UE functional use,  Coordination, FMC, Dexterity, Strength, ROM Cognitive Skills: Attention, Memory, Emotional, Problem Solve, Safety Awareness     Visit Diagnosis: Muscle weakness (generalized)  Other lack of coordination    Problem List Patient Active Problem List   Diagnosis Date Noted  . Sepsis ([place]) 10/18/2018    Harrel Carina, MS, OTR/L 09/01/2019, 2:53 PM  [place] MAIN [facility] SERVICES [address] [place], [place], [address] Phone: [phone]   Fax:  [phone]  Name: Kenneth Summers MRN: [id number] Date of Birth: [birth year]

## 2019-09-01 NOTE — Therapy (Signed)
[place] MAIN [facility] SERVICES [address] [place], [place], [address] Phone: [phone]   Fax:  [phone]  Physical Therapy Treatment  Patient Details  Name: Kenneth Summers MRN: [id number] Date of Birth: [birth year] No data recorded  Encounter Date: 09/01/2019   PT End of Session - 09/01/19 1302    Visit Number [id number]    Number of Visits 216    Date for PT Re-Evaluation 11/12/19    Authorization Type Medicare reporting period starting 09/11/18; NO FOTO    Authorization Time Period goals last assessed 08/20/19    PT Start Time 1302    PT Stop Time 1345    PT Time Calculation (min) 43 min    Equipment Utilized During Treatment Gait belt    Activity Tolerance Patient tolerated treatment well;No increased pain    Behavior During Therapy WFL for tasks assessed/performed           Past Medical History:  Diagnosis Date  . Actinic keratosis   . Alcohol abuse   . APS (antiphospholipid syndrome) ([place])   . Basal cell carcinoma   . BPH (benign prostatic hyperplasia)   . CAD (coronary artery disease)   . Cellulitis   . Cervical spinal stenosis   . Chronic osteomyelitis ([place])   . CKD (chronic kidney disease)   . CKD (chronic kidney disease)   . Clostridium difficile diarrhea   . Depression   . Diplopia   . Fatigue   . GERD (gastroesophageal reflux disease)   . Hyperlipemia   . Hypertension   . Hypovitaminosis D   . IBS (irritable bowel syndrome)   . 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 Insomnia   . Left lumbosacral radiculopathy   . Moderate obstructive sleep apnea   . Osteomyelitis of foot ([place])   . PAD (peripheral artery disease) ([place])   . Pruritus   . RA (rheumatoid arthritis) ([place])   . Radiculopathy   . Restless legs syndrome   . RLS (restless legs syndrome)   . SLE (systemic lupus erythematosus related syndrome) ([place])   . 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 Stroke ([place])   . Toxic maculopathy of  both eyes     Past Surgical History:  Procedure Laterality Date  . CARDIAC CATHETERIZATION    . CAROTID ENDARTERECTOMY    . CERVICAL LAMINECTOMY    . CORONARY ANGIOPLASTY    . FRACTURE SURGERY    . fusion C5-6-7    . HERNIA REPAIR    . KNEE ARTHROSCOPY    . TONSILLECTOMY      There were no vitals filed for this visit.   Subjective Assessment - 09/01/19 1444    Subjective Patient reports doing well; He reports his hoyer lift broke over the weekend and he is having to use a replacement that doens't work as well. Otherwise no new complaints.    Patient is accompained by: Family member    Pertinent History Patient is a 74 y.o. male who presents to outpatient physical therapy with a referral for medical diagnosis of CVA. This patient's chief complaints consist of left hemiplegia and overall deconditioning leading to the following functional deficits: dependent for ADLs, IADLs, unable to transfer to car, dependent transfers at home with hoyer lift, unable to walk or stand without significant assistance, difficulty with W/C navigation..    Limitations Sitting;Lifting;Standing;House hold activities;Writing;Walking;Other (comment)    Currently in Pain? No/denies    Multiple Pain  Sites No                     TREATMENT: Prior to transfersand in between transfers: Patient sittingin wheelchair: -hip flexion march 2x15 reps each LE with verbal/visual cues to increase AROM; -LLE only LAQ x15 -sitting neutral posture deep breathing x5 reps;  -unsupported sitting head/trunk rotation x5 reps;  Reinforced HEP with instruction to focus on isolating one leg at a time to help with disassociation;also reinstructed patient in neutral sitting posture to avoid left lateral lean;   Ptrequires verbal and visual cues to improve ROM;He was able to exhibit improved LLE AROM with cues; Patient also requires cues for erect posture for better trunk control;  Following  exercise: Transferred sit<>Stand in parallel bars (seat adjusted to27 inch from floor height) x1rep with min A to CGA;herequiredmodVCs for forward weight shift for better trunk control and transfer ability;  Progressed transfer training, lowering height of chair to 26 inch to challenge LE strength and positioning, x4 reps Required min A with tactile cues for forward weight shift for increased hip clearance from chair to assist with transfer;   Standing in parallel bars: Standing,min Awith min VCs for weight shift and increased extension for better stance control; -Mini squatx10repswith CGA and min VCs for weight shift and to increase LE muscle activation; -side/side weight shift x10 reps with B rail assist, min A for safety;  -standing with neutral weight shift x2 reps;    Patient initially exhibits flexed posture with decreased hip extension;he also exhibits heavy lean toleftside requiring instruction for weight shift to neutral position;    Patient requiredmodA to scoot back in chair at end of session due to increased fatigue    Response to treatment: Patient tolerated session well. He was able to progress with better trunk control and neutral weight shift with increased activity. He also exhibits improved LLE AROM with increased activity. Patient does report increased fatigue at end of session.                 PT Education - 09/01/19 1302    Education Details transfers/positioning, HEP    Person(s) Educated Patient    Methods Explanation;Verbal cues    Comprehension Verbalized understanding;Returned demonstration;Verbal cues required;Need further instruction            PT Short Term Goals - 08/18/19 1305      PT SHORT TERM GOAL #1   Title Be independent with initial home exercise program for self-management of symptoms.    Baseline HEP to be given at second session; advice to practice unsupported sitting at home (06/19/2018); 07/08/18:  Performing at home, 6/29: needs assistance but is doing exercise program regularly; 08/22/18: adherent;    Time 2    Period Weeks    Status Achieved    Target Date 06/26/18             PT Long Term Goals - 08/20/19 1456      PT LONG TERM GOAL #1   Title Patient will complete all bed mobility with min A to improve functional independence for getting in and out of bed and adjusting in bed.     Baseline max A - total A reported by family (06/19/2018); 07/08/18: still requires maxA, dtr reports improvement in rolling since starting therapy; 6/23: min A to supervision in clinic; not doing at home right; 8/5: Pt's daughter states that his rolling has improved, but still has difficulty with all other bed mobility; 10/23/18: pts  daughter states that she is doing about 35% of the work if he is in proper position, he does uses the bed rails to help roll;  using Michiel Sites for coming to sit EOB; 10/12: pts daughter states that she is doing about 30% of the work if he is in proper position, he does uses the bed rails to help roll, but it is improving;  using Hoyer for coming to sit EOB, 10/29: min A for sit to sidelying, rolling supervision, able to transition sidelying to sit with HHA;    Time 12    Period Weeks    Status Achieved      PT LONG TERM GOAL #2   Title Patient will complete sit <> stand transfer chair to chair with Min A and LRAD to improve functional independence for household and community mobility.     Baseline supervision for STS, still requires elevated surface height to 27"    Time 12    Period Weeks    Status Achieved      PT LONG TERM GOAL #3   Title Patient will navigate power w/c with min A x 100 feet to improve mobility for household and short community distances.    Baseline required total A (06/12/2018); able to roll 20 feet with min A (06/20/2018); 07/08/18: Pt can go approximately 10' without assist per family;  10/23/2018: pt's daughter states that his power WC mobility has become 100%  better with the new hand control, slowed speed, and with practice; 10/12: pt's daughter reports that he can perform household WC mobility and limited community ambulation with minA    Time 12    Period Weeks    Status Achieved      PT LONG TERM GOAL #4   Title Patient will complete car transfers W/C to family SUV with min A using LRAD to improve his abilty to participate in community activities.     Baseline unable to complete car transfers at all (06/12/2018); unable to complete car transfers at all (06/19/2018).; 07/08/18: Unable to perform at this time; 09/11/18: unable to perform at this time; 10/23/2018: unable to complete car transfers at all; 10/12:  unable to complete car transfers at all, 10/29: unable; 01/13/19: unable; 2/11: min-mod A +2, 3/10: min A+1    Time 12    Period Weeks    Status Achieved      PT LONG TERM GOAL #5   Title Patient will ambulate at least 10 feet with min A using LRAD to improve mobility for household and community distances.    Baseline unable to take steps (06/12/2018); able to shift R leg forward and back with max A and RW at edge of plinth (06/19/2018); 07/08/18: unable to ambulate at this time. 09/11/18: unable to perform at this time; 10/23/2018: unable to perform at this time; 10/12: unable to perform at this time, 10/29: unable at this time; 01/13/19: unable at this time, 2/11: min A to step with RLE, unable to step with LLE, 3/10: able to take 2 steps in parallel bars with min A for safety; 4/1: min A for walking 10 feet in parallel bars, 5/24: min A walking 10 feet in parallel bars; 6/21: no change 7/14: no change    Time 12    Period Weeks    Status Partially Met    Target Date 11/12/19      PT LONG TERM GOAL #6   Title Patient will be able to safety navigate up/down ramp with power chair,  exhibiting good safety awareness, mod I to safely enter/exit his home.    Baseline 2/11: supervision with intermittent min A;4/1: supervision; 5/24: supervision    Time 12     Period Weeks    Status Partially Met    Target Date 11/12/19      PT LONG TERM GOAL #7   Title Patient will increase functional reach test to >15 inches in sitting to exhibit improved sitting balance and positioning and reduce fall risk;    Baseline 07/30/18: 12 inches; 09/11/18: 10-12 inches; 10/23/18: 12-13 inch; 10/12: 14.5", 10/29: 15.5 inch    Time 4    Period Weeks    Status Achieved      PT LONG TERM GOAL #8   Title Patient will increase LLE quad strength to 3+/5 to improve functional strength for standing and mobility;    Baseline 10/28: 2+/5; 01/13/19: 2+/5, 2/11: 3-/5, 04/16/19: 3-/5, 05/08/19: 3-/5, 5/24: 3-/5, 6/21: 3-/5, 7/14: 3-/5    Time 12    Period Weeks    Status Partially Met    Target Date 11/12/19      PT LONG TERM GOAL  #9   TITLE Patient will report no vertigo with provoking motions or positions in order to be able to perform ADLs and mobility tasks without symptoms.    Baseline 06/30/19: reports no dizziness in last week; 6/21: one episode this weekend; 7/14: experiencing one a week;    Time 12    Period Weeks    Status Partially Met    Target Date 11/12/19                 Plan - 09/02/19 0854    Clinical Impression Statement Patient motivated and participated well within session. Progressed sit<>stand transfers with reducing height of chair to 26 inches. Patient does require min A in form of therapist tactile cues to shoulders for forward weight shift for better sit<>stand ability. Patient initially exhibited increased left lateral trunk lean. He was able to exhibit better weight shift in standing with increased weight bearing in LE and less posterior lean. Patient does fatigue quickly requiring short sitting rest breaks. Instructed patient in seated LE strengthening and trunk activities during rest breaks for increased strengthening. Patient would benefit from additional skilled PT intervention to improve strength, balance and mobility;    Personal Factors and  Comorbidities Age;Comorbidity 3+    Comorbidities Relevant past medical history and comorbidities include long term steroid use for lupus, CKD, chronic osteomyelitis, cervical spine stenosis, BPH, APS, alcohol abuse, peripheral artery disease, depression, GERD, obstructive sleep apnea, HTN, IBS, IgA deficiency, lumbar radiculopathy, RA, Stroke, toxic maculopathy in both eyes, systemic lupus erythematosus related syndrome, cardiac catheterization, carotid endarterectomy, cervical laminectomy, coronary angioplasty, Fusion C5-C7, knee arthroplasty, lumbar surgery.    Examination-Activity Limitations Bed Mobility;Bend;Sit;Toileting;Stand;Stairs;Lift;Transfers;Squat;Locomotion Level;Carry;Dressing;Hygiene/Grooming;Continence    Examination-Participation Restrictions Yard Work;Interpersonal Relationship;Community Activity    Stability/Clinical Decision Making Evolving/Moderate complexity    Rehab Potential Fair    PT Frequency 3x / week    PT Duration 12 weeks    PT Treatment/Interventions ADLs/Self Care Home Management;Electrical Stimulation;Moist Heat;Cryotherapy;Gait training;Stair training;Functional mobility training;Therapeutic exercise;Balance training;Neuromuscular re-education;Cognitive remediation;Patient/family education;Orthotic Fit/Training;Wheelchair mobility training;Manual techniques;Passive range of motion;Energy conservation;Joint Manipulations;Canalith Repostioning;Therapeutic activities;Vestibular    PT Next Visit Plan Work on strength, sitting/standing balance, functional activities such as rolling, bed mobility, transfers; caregiver training    PT Home Exercise Plan no updates this session    Consulted and Agree with Plan of Care Patient;Family member/caregiver    Family  Member Consulted Wife           Patient will benefit from skilled therapeutic intervention in order to improve the following deficits and impairments:  Abnormal gait, Decreased activity tolerance, Decreased  cognition, Decreased endurance, Decreased knowledge of use of DME, Decreased range of motion, Decreased skin integrity, Decreased strength, Impaired perceived functional ability, Impaired sensation, Impaired UE functional use, Improper body mechanics, Pain, Cardiopulmonary status limiting activity, Decreased balance, Decreased coordination, Decreased mobility, Difficulty walking, Impaired tone, Postural dysfunction, Dizziness  Visit Diagnosis: Muscle weakness (generalized)  Other lack of coordination  Difficulty in walking, not elsewhere classified  Repeated falls     Problem List Patient Active Problem List   Diagnosis Date Noted  . Sepsis ([place]) 10/18/2018    Amberle Lyter PT, DPT 09/02/2019, 8:56 AM  [place] MAIN [facility] SERVICES [address] [place], [place], [address] Phone: [phone]   Fax:  [phone]  Name: DOMANIQUE LUCKETT MRN: [id number] Date of Birth: 02/27/1945

## 2019-09-03 ENCOUNTER — Other Ambulatory Visit: Payer: Self-pay

## 2019-09-03 ENCOUNTER — Encounter: Payer: Self-pay | Admitting: Physical Therapy

## 2019-09-03 ENCOUNTER — Ambulatory Visit: Payer: Medicare Other | Admitting: Physical Therapy

## 2019-09-03 DIAGNOSIS — R278 Other lack of coordination: Secondary | ICD-10-CM

## 2019-09-03 DIAGNOSIS — M6281 Muscle weakness (generalized): Secondary | ICD-10-CM

## 2019-09-03 DIAGNOSIS — R296 Repeated falls: Secondary | ICD-10-CM

## 2019-09-03 DIAGNOSIS — R262 Difficulty in walking, not elsewhere classified: Secondary | ICD-10-CM

## 2019-09-03 NOTE — Therapy (Signed)
[place] MAIN [facility] SERVICES [address] [place], [place], [address] Phone: [phone]   Fax:  [phone]  Physical Therapy Treatment  Patient Details  Name: Kenneth Summers MRN: [id number] Date of Birth: [birth year] No data recorded  Encounter Date: 09/03/2019   PT End of Session - 09/03/19 1302    Visit Number [id number]    Number of Visits 216    Date for PT Re-Evaluation 11/12/19    Authorization Type Medicare reporting period starting 09/11/18; NO FOTO    Authorization Time Period goals last assessed 08/20/19    PT Start Time 1302    PT Stop Time 1345    PT Time Calculation (min) 43 min    Equipment Utilized During Treatment Gait belt    Activity Tolerance Patient tolerated treatment well;No increased pain    Behavior During Therapy WFL for tasks assessed/performed           Past Medical History:  Diagnosis Date  . Actinic keratosis   . Alcohol abuse   . APS (antiphospholipid syndrome) ([place])   . Basal cell carcinoma   . BPH (benign prostatic hyperplasia)   . CAD (coronary artery disease)   . Cellulitis   . Cervical spinal stenosis   . Chronic osteomyelitis ([place])   . CKD (chronic kidney disease)   . CKD (chronic kidney disease)   . Clostridium difficile diarrhea   . Depression   . Diplopia   . Fatigue   . GERD (gastroesophageal reflux disease)   . Hyperlipemia   . Hypertension   . Hypovitaminosis D   . IBS (irritable bowel syndrome)   . 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 Insomnia   . Left lumbosacral radiculopathy   . Moderate obstructive sleep apnea   . Osteomyelitis of foot ([place])   . PAD (peripheral artery disease) ([place])   . Pruritus   . RA (rheumatoid arthritis) ([place])   . Radiculopathy   . Restless legs syndrome   . RLS (restless legs syndrome)   . SLE (systemic lupus erythematosus related syndrome) ([place])   . 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 Stroke ([place])   . Toxic maculopathy of  both eyes     Past Surgical History:  Procedure Laterality Date  . CARDIAC CATHETERIZATION    . CAROTID ENDARTERECTOMY    . CERVICAL LAMINECTOMY    . CORONARY ANGIOPLASTY    . FRACTURE SURGERY    . fusion C5-6-7    . HERNIA REPAIR    . KNEE ARTHROSCOPY    . TONSILLECTOMY      There were no vitals filed for this visit.   Subjective Assessment - 09/04/19 0826    Subjective Patient reports doing well; no pain currently; He is still waiting on a new hoyer lift for increased mobility at home.    Patient is accompained by: Family member    Pertinent History Patient is a 74 y.o. male who presents to outpatient physical therapy with a referral for medical diagnosis of CVA. This patient's chief complaints consist of left hemiplegia and overall deconditioning leading to the following functional deficits: dependent for ADLs, IADLs, unable to transfer to car, dependent transfers at home with hoyer lift, unable to walk or stand without significant assistance, difficulty with W/C navigation..    Limitations Sitting;Lifting;Standing;House hold activities;Writing;Walking;Other (comment)    Currently in Pain? No/denies    Multiple Pain Sites No  TREATMENT: Prior to transfersand in between transfers: Patient sittingin wheelchair: -hip flexion march2x10 reps each LE with verbal/visual cues to increase AROM; -LLE only LAQ x15 -sitting neutral posture deep breathing x5 reps; Reinforced HEP with instruction to focus on isolating one leg at a time to help with disassociation;also reinstructed patient in neutral sitting posture to avoid left lateral lean;  Ptrequires verbal and visual cues to improve ROM;He was able to exhibit improved LLE AROM with cues; Patient also requires cues for erect posture for better trunk control;  Following exercise: Transferred sit<>Stand in parallel bars (seat adjusted to26 inch from floor height) x7rep with min A to close  supervision;herequiredmodVCs for forward weight shift for better trunk control and transfer ability;   Standing in parallel bars: Standing,min Awith min VCs for weight shift and increased extension for better stance control; -Mini squatx10repswith CGA and min VCs for weight shift and to increase LE muscle activation; -Heel raises x10 reps;  -Standing toe taps on 4 inch step:  RLE: x3, x3, x4 reps with BUE rail assist, min A for proper positioning;   LLE: x5 reps, x1 rep; with heavy posterior lean requiring min-mod A for erect posture and weight shift;  -standing with neutral weight shift x2 reps;    Patient able to progress transfers this session to supervision at 26 inch height surface. He continues to require cues for weight shift and proper trunk control including to increase hip extension in stance.    Patient requiredmodA to scoot back in chair at end of session due to increased fatigue    Response to treatment: Patient tolerated session well. He was able to progress with better trunk control and neutral weight shift with increased activity. He also exhibits improved LLE AROM with increased activity. Patient does report increased fatigue at end of session. He reports increased fatigue in LLE and states that he feels more soreness in LLE which is a good indicator that he is getting more sensation in LLE;                        PT Education - 09/03/19 1302    Education Details transfers/positioning, HEP    Person(s) Educated Patient    Methods Explanation;Verbal cues    Comprehension Verbalized understanding;Returned demonstration;Verbal cues required;Need further instruction            PT Short Term Goals - 08/18/19 1305      PT SHORT TERM GOAL #1   Title Be independent with initial home exercise program for self-management of symptoms.    Baseline HEP to be given at second session; advice to practice unsupported sitting at home  (06/19/2018); 07/08/18: Performing at home, 6/29: needs assistance but is doing exercise program regularly; 08/22/18: adherent;    Time 2    Period Weeks    Status Achieved    Target Date 06/26/18             PT Long Term Goals - 08/20/19 1456      PT LONG TERM GOAL #1   Title Patient will complete all bed mobility with min A to improve functional independence for getting in and out of bed and adjusting in bed.     Baseline max A - total A reported by family (06/19/2018); 07/08/18: still requires maxA, dtr reports improvement in rolling since starting therapy; 6/23: min A to supervision in clinic; not doing at home right; 8/5: Pt's daughter states that his rolling has improved, but  still has difficulty with all other bed mobility; 10/23/18: pts daughter states that she is doing about 35% of the work if he is in proper position, he does uses the bed rails to help roll;  using Harrel Lemon for coming to sit EOB; 10/12: pts daughter states that she is doing about 30% of the work if he is in proper position, he does uses the bed rails to help roll, but it is improving;  using Hoyer for coming to sit EOB, 10/29: min A for sit to sidelying, rolling supervision, able to transition sidelying to sit with HHA;    Time 12    Period Weeks    Status Achieved      PT LONG TERM GOAL #2   Title Patient will complete sit <> stand transfer chair to chair with Min A and LRAD to improve functional independence for household and community mobility.     Baseline supervision for STS, still requires elevated surface height to 27"    Time 12    Period Weeks    Status Achieved      PT LONG TERM GOAL #3   Title Patient will navigate power w/c with min A x 100 feet to improve mobility for household and short community distances.    Baseline required total A (06/12/2018); able to roll 20 feet with min A (06/20/2018); 07/08/18: Pt can go approximately 10' without assist per family;  10/23/2018: pt's daughter states that his power WC  mobility has become 100% better with the new hand control, slowed speed, and with practice; 10/12: pt's daughter reports that he can perform household WC mobility and limited community ambulation with minA    Time 12    Period Weeks    Status Achieved      PT LONG TERM GOAL #4   Title Patient will complete car transfers W/C to family SUV with min A using LRAD to improve his abilty to participate in community activities.     Baseline unable to complete car transfers at all (06/12/2018); unable to complete car transfers at all (06/19/2018).; 07/08/18: Unable to perform at this time; 09/11/18: unable to perform at this time; 10/23/2018: unable to complete car transfers at all; 10/12:  unable to complete car transfers at all, 10/29: unable; 01/13/19: unable; 2/11: min-mod A +2, 3/10: min A+1    Time 12    Period Weeks    Status Achieved      PT LONG TERM GOAL #5   Title Patient will ambulate at least 10 feet with min A using LRAD to improve mobility for household and community distances.    Baseline unable to take steps (06/12/2018); able to shift R leg forward and back with max A and RW at edge of plinth (06/19/2018); 07/08/18: unable to ambulate at this time. 09/11/18: unable to perform at this time; 10/23/2018: unable to perform at this time; 10/12: unable to perform at this time, 10/29: unable at this time; 01/13/19: unable at this time, 2/11: min A to step with RLE, unable to step with LLE, 3/10: able to take 2 steps in parallel bars with min A for safety; 4/1: min A for walking 10 feet in parallel bars, 5/24: min A walking 10 feet in parallel bars; 6/21: no change 7/14: no change    Time 12    Period Weeks    Status Partially Met    Target Date 11/12/19      PT LONG TERM GOAL #6   Title Patient will  be able to safety navigate up/down ramp with power chair, exhibiting good safety awareness, mod I to safely enter/exit his home.    Baseline 2/11: supervision with intermittent min A;4/1: supervision; 5/24:  supervision    Time 12    Period Weeks    Status Partially Met    Target Date 11/12/19      PT LONG TERM GOAL #7   Title Patient will increase functional reach test to >15 inches in sitting to exhibit improved sitting balance and positioning and reduce fall risk;    Baseline 07/30/18: 12 inches; 09/11/18: 10-12 inches; 10/23/18: 12-13 inch; 10/12: 14.5", 10/29: 15.5 inch    Time 4    Period Weeks    Status Achieved      PT LONG TERM GOAL #8   Title Patient will increase LLE quad strength to 3+/5 to improve functional strength for standing and mobility;    Baseline 10/28: 2+/5; 01/13/19: 2+/5, 2/11: 3-/5, 04/16/19: 3-/5, 05/08/19: 3-/5, 5/24: 3-/5, 6/21: 3-/5, 7/14: 3-/5    Time 12    Period Weeks    Status Partially Met    Target Date 11/12/19      PT LONG TERM GOAL  #9   TITLE Patient will report no vertigo with provoking motions or positions in order to be able to perform ADLs and mobility tasks without symptoms.    Baseline 06/30/19: reports no dizziness in last week; 6/21: one episode this weekend; 7/14: experiencing one a week;    Time 12    Period Weeks    Status Partially Met    Target Date 11/12/19                 Plan - 09/03/19 1327    Clinical Impression Statement Patient motivated and participated well within session. He was able to progress sit<>Stand transfers to close supervision from 26 inch surface. He does require increased cues for forward weight shift and requires increased time. patient instructed in advanced LE strengthening exercise. Patient does fatigue with prolonged standing. He was able to exhibit better LLE AROM this session wtih improved motor control. He was consistently able to place LLE foot on/off foot plate which is an improvement. He would benefit from additional skilled PT Intervention to improve strength, balance and mobility;    Personal Factors and Comorbidities Age;Comorbidity 3+    Comorbidities Relevant past medical history and comorbidities  include long term steroid use for lupus, CKD, chronic osteomyelitis, cervical spine stenosis, BPH, APS, alcohol abuse, peripheral artery disease, depression, GERD, obstructive sleep apnea, HTN, IBS, IgA deficiency, lumbar radiculopathy, RA, Stroke, toxic maculopathy in both eyes, systemic lupus erythematosus related syndrome, cardiac catheterization, carotid endarterectomy, cervical laminectomy, coronary angioplasty, Fusion C5-C7, knee arthroplasty, lumbar surgery.    Examination-Activity Limitations Bed Mobility;Bend;Sit;Toileting;Stand;Stairs;Lift;Transfers;Squat;Locomotion Level;Carry;Dressing;Hygiene/Grooming;Continence    Examination-Participation Restrictions Yard Work;Interpersonal Relationship;Community Activity    Stability/Clinical Decision Making Evolving/Moderate complexity    Rehab Potential Fair    PT Frequency 3x / week    PT Duration 12 weeks    PT Treatment/Interventions ADLs/Self Care Home Management;Electrical Stimulation;Moist Heat;Cryotherapy;Gait training;Stair training;Functional mobility training;Therapeutic exercise;Balance training;Neuromuscular re-education;Cognitive remediation;Patient/family education;Orthotic Fit/Training;Wheelchair mobility training;Manual techniques;Passive range of motion;Energy conservation;Joint Manipulations;Canalith Repostioning;Therapeutic activities;Vestibular    PT Next Visit Plan Work on strength, sitting/standing balance, functional activities such as rolling, bed mobility, transfers; caregiver training    PT Home Exercise Plan no updates this session    Consulted and Agree with Plan of Care Patient;Family member/caregiver    Family Member Consulted Wife  Patient will benefit from skilled therapeutic intervention in order to improve the following deficits and impairments:  Abnormal gait, Decreased activity tolerance, Decreased cognition, Decreased endurance, Decreased knowledge of use of DME, Decreased range of motion, Decreased  skin integrity, Decreased strength, Impaired perceived functional ability, Impaired sensation, Impaired UE functional use, Improper body mechanics, Pain, Cardiopulmonary status limiting activity, Decreased balance, Decreased coordination, Decreased mobility, Difficulty walking, Impaired tone, Postural dysfunction, Dizziness  Visit Diagnosis: Muscle weakness (generalized)  Other lack of coordination  Difficulty in walking, not elsewhere classified  Repeated falls     Problem List Patient Active Problem List   Diagnosis Date Noted  . Sepsis ([place]) 10/18/2018    Trotter,Margaret PT, DPT 09/04/2019, 8:29 AM  [facility] MAIN [facility] SERVICES [address] [place], [place], [address] Phone: [phone]   Fax:  [phone]  Name: Kenneth Summers MRN: [id number] Date of Birth: Jun 14, 1945

## 2019-09-04 ENCOUNTER — Encounter: Payer: Self-pay | Admitting: Physical Therapy

## 2019-09-04 ENCOUNTER — Ambulatory Visit: Payer: Medicare Other | Admitting: Physical Therapy

## 2019-09-04 ENCOUNTER — Other Ambulatory Visit: Payer: Self-pay

## 2019-09-04 DIAGNOSIS — M6281 Muscle weakness (generalized): Secondary | ICD-10-CM

## 2019-09-04 DIAGNOSIS — R278 Other lack of coordination: Secondary | ICD-10-CM

## 2019-09-04 DIAGNOSIS — R262 Difficulty in walking, not elsewhere classified: Secondary | ICD-10-CM

## 2019-09-04 DIAGNOSIS — R296 Repeated falls: Secondary | ICD-10-CM

## 2019-09-04 NOTE — Therapy (Signed)
[place] MAIN [facility] SERVICES [address] [place], [place], [address] Phone: [phone]   Fax:  [phone]  Physical Therapy Treatment  Patient Details  Name: Kenneth Summers MRN: [id number] Date of Birth: 30-Apr-1945 No data recorded  Encounter Date: 09/04/2019   PT End of Session - 09/04/19 1320    Visit Number [id number]    Number of Visits 216    Date for PT Re-Evaluation 11/12/19    Authorization Type Medicare reporting period starting 09/11/18; NO FOTO    Authorization Time Period goals last assessed 08/20/19    PT Start Time 1302    PT Stop Time 1345    PT Time Calculation (min) 43 min    Equipment Utilized During Treatment Gait belt    Activity Tolerance Patient tolerated treatment well;No increased pain    Behavior During Therapy WFL for tasks assessed/performed           Past Medical History:  Diagnosis Date  . Actinic keratosis   . Alcohol abuse   . APS (antiphospholipid syndrome) ([place])   . Basal cell carcinoma   . BPH (benign prostatic hyperplasia)   . CAD (coronary artery disease)   . Cellulitis   . Cervical spinal stenosis   . Chronic osteomyelitis ([place])   . CKD (chronic kidney disease)   . CKD (chronic kidney disease)   . Clostridium difficile diarrhea   . Depression   . Diplopia   . Fatigue   . GERD (gastroesophageal reflux disease)   . Hyperlipemia   . Hypertension   . Hypovitaminosis D   . IBS (irritable bowel syndrome)   . 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 Insomnia   . Left lumbosacral radiculopathy   . Moderate obstructive sleep apnea   . Osteomyelitis of foot ([place])   . PAD (peripheral artery disease) ([place])   . Pruritus   . RA (rheumatoid arthritis) ([place])   . Radiculopathy   . Restless legs syndrome   . RLS (restless legs syndrome)   . SLE (systemic lupus erythematosus related syndrome) ([place])   . 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 Stroke ([place])   . Toxic maculopathy of  both eyes     Past Surgical History:  Procedure Laterality Date  . CARDIAC CATHETERIZATION    . CAROTID ENDARTERECTOMY    . CERVICAL LAMINECTOMY    . CORONARY ANGIOPLASTY    . FRACTURE SURGERY    . fusion C5-6-7    . HERNIA REPAIR    . KNEE ARTHROSCOPY    . TONSILLECTOMY      There were no vitals filed for this visit.   Subjective Assessment - 09/04/19 1319    Subjective Patient reports increased fatigue. He reports that he didn't sleep well last night with his left leg jerking last night; Denies any pain;    Patient is accompained by: Family member    Pertinent History Patient is a 74 y.o. male who presents to outpatient physical therapy with a referral for medical diagnosis of CVA. This patient's chief complaints consist of left hemiplegia and overall deconditioning leading to the following functional deficits: dependent for ADLs, IADLs, unable to transfer to car, dependent transfers at home with hoyer lift, unable to walk or stand without significant assistance, difficulty with W/C navigation..    Limitations Sitting;Lifting;Standing;House hold activities;Writing;Walking;Other (comment)    Currently in Pain? No/denies    Multiple Pain Sites No  TREATMENT: Prior to transfersand in between transfers: Patient sittingin wheelchair: -hip flexion marchx10reps each LE with verbal/visual cues to increase AROM; -LLE only LAQ2x10 -sitting neutral posture head/trunk rotation x5 reps each direction -sitting neutral position, reaching across body side/side x5 reps; Reinforced HEP with instruction to focus on isolating one leg at a time to help with disassociation;also reinstructed patient in neutral sitting posture to avoid left lateral lean;  Ptrequires verbal and visual cues to improve ROM;He was able to exhibit improved LLE AROM with cues; Patient also requires cues for erect posture for better trunk control;  Following exercise: Transferred  sit<>Stand in parallel bars (seat adjusted to26 inch from floor height) x7repwith min A- CGA;herequiredmodVCs for forward weight shift for better trunk control and transfer ability; Patient able to exhibit better transfer ability with increased forward weight shift but then starts falling backwards requiring cues to increase forward trunk progression and hip extension;    Standing in parallel bars: Standing,min Awith min VCs for weight shift and increased extension for better stance control; -Mini squatx10repswith CGA and min VCs for weight shift and to increase LE muscle activation; -Heel raises x10 reps;  -standing with neutral weight shift x2 reps;   Patient able to progress transfers this session to supervision at 26 inch height surface. He continues to require cues for weight shift and proper trunk control including to increase hip extension in stance.    Patient requiredmodA to scoot back in chair at end of session due to increased fatigue    Response to treatment: Patient tolerated session well. He was able to progress with better trunk control and neutral weight shift with increased activity. He also exhibits improved LLE AROM with increased activity. Patient does report increased fatigue at end of session. He reports increased fatigue in LLE and states that he feels more soreness in LLE which is a good indicator that he is getting more sensation in LLE;                          PT Education - 09/04/19 1320    Education Details transfers/positioning, HEP    Person(s) Educated Patient    Methods Explanation;Verbal cues    Comprehension Verbalized understanding;Returned demonstration;Verbal cues required;Need further instruction            PT Short Term Goals - 08/18/19 1305      PT SHORT TERM GOAL #1   Title Be independent with initial home exercise program for self-management of symptoms.    Baseline HEP to be given at  second session; advice to practice unsupported sitting at home (06/19/2018); 07/08/18: Performing at home, 6/29: needs assistance but is doing exercise program regularly; 08/22/18: adherent;    Time 2    Period Weeks    Status Achieved    Target Date 06/26/18             PT Long Term Goals - 08/20/19 1456      PT LONG TERM GOAL #1   Title Patient will complete all bed mobility with min A to improve functional independence for getting in and out of bed and adjusting in bed.     Baseline max A - total A reported by family (06/19/2018); 07/08/18: still requires maxA, dtr reports improvement in rolling since starting therapy; 6/23: min A to supervision in clinic; not doing at home right; 8/5: Pt's daughter states that his rolling has improved, but still has difficulty with all other bed mobility;  10/23/18: pts daughter states that she is doing about 35% of the work if he is in proper position, he does uses the bed rails to help roll;  using Harrel Lemon for coming to sit EOB; 10/12: pts daughter states that she is doing about 30% of the work if he is in proper position, he does uses the bed rails to help roll, but it is improving;  using Hoyer for coming to sit EOB, 10/29: min A for sit to sidelying, rolling supervision, able to transition sidelying to sit with HHA;    Time 12    Period Weeks    Status Achieved      PT LONG TERM GOAL #2   Title Patient will complete sit <> stand transfer chair to chair with Min A and LRAD to improve functional independence for household and community mobility.     Baseline supervision for STS, still requires elevated surface height to 27"    Time 12    Period Weeks    Status Achieved      PT LONG TERM GOAL #3   Title Patient will navigate power w/c with min A x 100 feet to improve mobility for household and short community distances.    Baseline required total A (06/12/2018); able to roll 20 feet with min A (06/20/2018); 07/08/18: Pt can go approximately 10' without assist per  family;  10/23/2018: pt's daughter states that his power WC mobility has become 100% better with the new hand control, slowed speed, and with practice; 10/12: pt's daughter reports that he can perform household WC mobility and limited community ambulation with minA    Time 12    Period Weeks    Status Achieved      PT LONG TERM GOAL #4   Title Patient will complete car transfers W/C to family SUV with min A using LRAD to improve his abilty to participate in community activities.     Baseline unable to complete car transfers at all (06/12/2018); unable to complete car transfers at all (06/19/2018).; 07/08/18: Unable to perform at this time; 09/11/18: unable to perform at this time; 10/23/2018: unable to complete car transfers at all; 10/12:  unable to complete car transfers at all, 10/29: unable; 01/13/19: unable; 2/11: min-mod A +2, 3/10: min A+1    Time 12    Period Weeks    Status Achieved      PT LONG TERM GOAL #5   Title Patient will ambulate at least 10 feet with min A using LRAD to improve mobility for household and community distances.    Baseline unable to take steps (06/12/2018); able to shift R leg forward and back with max A and RW at edge of plinth (06/19/2018); 07/08/18: unable to ambulate at this time. 09/11/18: unable to perform at this time; 10/23/2018: unable to perform at this time; 10/12: unable to perform at this time, 10/29: unable at this time; 01/13/19: unable at this time, 2/11: min A to step with RLE, unable to step with LLE, 3/10: able to take 2 steps in parallel bars with min A for safety; 4/1: min A for walking 10 feet in parallel bars, 5/24: min A walking 10 feet in parallel bars; 6/21: no change 7/14: no change    Time 12    Period Weeks    Status Partially Met    Target Date 11/12/19      PT LONG TERM GOAL #6   Title Patient will be able to safety navigate up/down ramp with  power chair, exhibiting good safety awareness, mod I to safely enter/exit his home.    Baseline 2/11:  supervision with intermittent min A;4/1: supervision; 5/24: supervision    Time 12    Period Weeks    Status Partially Met    Target Date 11/12/19      PT LONG TERM GOAL #7   Title Patient will increase functional reach test to >15 inches in sitting to exhibit improved sitting balance and positioning and reduce fall risk;    Baseline 07/30/18: 12 inches; 09/11/18: 10-12 inches; 10/23/18: 12-13 inch; 10/12: 14.5", 10/29: 15.5 inch    Time 4    Period Weeks    Status Achieved      PT LONG TERM GOAL #8   Title Patient will increase LLE quad strength to 3+/5 to improve functional strength for standing and mobility;    Baseline 10/28: 2+/5; 01/13/19: 2+/5, 2/11: 3-/5, 04/16/19: 3-/5, 05/08/19: 3-/5, 5/24: 3-/5, 6/21: 3-/5, 7/14: 3-/5    Time 12    Period Weeks    Status Partially Met    Target Date 11/12/19      PT LONG TERM GOAL  #9   TITLE Patient will report no vertigo with provoking motions or positions in order to be able to perform ADLs and mobility tasks without symptoms.    Baseline 06/30/19: reports no dizziness in last week; 6/21: one episode this weekend; 7/14: experiencing one a week;    Time 12    Period Weeks    Status Partially Met    Target Date 11/12/19                 Plan - 09/04/19 1517    Clinical Impression Statement Patient motivated and participated fair this session. He was more fatigued this session. However he was able to lift his left foot on/off foot plate consistently this session. Initially he does exhibit increased stiffness in trunk with decreased forward lean however with increased repetition patient able to exhibit better trunk control and improved forward trunk lean for transfers. He was able to exhibit better push through BLE for initial transfer but continues to require cues for forward weight shift for mid to late transfer for increased hip extension. Patient fatigued quickly throughout session requiring short seated rest break. He would benefit from  additional skilled PT intervention to improve strength, balance and mobility;    Personal Factors and Comorbidities Age;Comorbidity 3+    Comorbidities Relevant past medical history and comorbidities include long term steroid use for lupus, CKD, chronic osteomyelitis, cervical spine stenosis, BPH, APS, alcohol abuse, peripheral artery disease, depression, GERD, obstructive sleep apnea, HTN, IBS, IgA deficiency, lumbar radiculopathy, RA, Stroke, toxic maculopathy in both eyes, systemic lupus erythematosus related syndrome, cardiac catheterization, carotid endarterectomy, cervical laminectomy, coronary angioplasty, Fusion C5-C7, knee arthroplasty, lumbar surgery.    Examination-Activity Limitations Bed Mobility;Bend;Sit;Toileting;Stand;Stairs;Lift;Transfers;Squat;Locomotion Level;Carry;Dressing;Hygiene/Grooming;Continence    Examination-Participation Restrictions Yard Work;Interpersonal Relationship;Community Activity    Stability/Clinical Decision Making Evolving/Moderate complexity    Rehab Potential Fair    PT Frequency 3x / week    PT Duration 12 weeks    PT Treatment/Interventions ADLs/Self Care Home Management;Electrical Stimulation;Moist Heat;Cryotherapy;Gait training;Stair training;Functional mobility training;Therapeutic exercise;Balance training;Neuromuscular re-education;Cognitive remediation;Patient/family education;Orthotic Fit/Training;Wheelchair mobility training;Manual techniques;Passive range of motion;Energy conservation;Joint Manipulations;Canalith Repostioning;Therapeutic activities;Vestibular    PT Next Visit Plan Work on strength, sitting/standing balance, functional activities such as rolling, bed mobility, transfers; caregiver training    PT Home Exercise Plan no updates this session    Consulted and Agree with  Plan of Care Patient;Family member/caregiver    Family Member Consulted Wife           Patient will benefit from skilled therapeutic intervention in order to improve  the following deficits and impairments:  Abnormal gait, Decreased activity tolerance, Decreased cognition, Decreased endurance, Decreased knowledge of use of DME, Decreased range of motion, Decreased skin integrity, Decreased strength, Impaired perceived functional ability, Impaired sensation, Impaired UE functional use, Improper body mechanics, Pain, Cardiopulmonary status limiting activity, Decreased balance, Decreased coordination, Decreased mobility, Difficulty walking, Impaired tone, Postural dysfunction, Dizziness  Visit Diagnosis: Muscle weakness (generalized)  Other lack of coordination  Difficulty in walking, not elsewhere classified  Repeated falls     Problem List Patient Active Problem List   Diagnosis Date Noted  . Sepsis ([place]) 10/18/2018    , PT, DPT 09/04/2019, 3:29 PM  [place] MAIN [facility] SERVICES [address] [place], [place], [address] Phone: [phone]   Fax:  [phone]  Name: Kenneth Summers MRN: [id number] Date of Birth: 04/11/1945

## 2019-09-08 ENCOUNTER — Other Ambulatory Visit: Payer: Self-pay

## 2019-09-08 ENCOUNTER — Encounter: Payer: Self-pay | Admitting: Occupational Therapy

## 2019-09-08 ENCOUNTER — Encounter: Payer: Self-pay | Admitting: Physical Therapy

## 2019-09-08 ENCOUNTER — Ambulatory Visit: Payer: Medicare Other | Attending: Family Medicine | Admitting: Physical Therapy

## 2019-09-08 ENCOUNTER — Ambulatory Visit: Payer: Medicare Other | Admitting: Occupational Therapy

## 2019-09-08 DIAGNOSIS — M6281 Muscle weakness (generalized): Secondary | ICD-10-CM

## 2019-09-08 DIAGNOSIS — R41841 Cognitive communication deficit: Secondary | ICD-10-CM | POA: Insufficient documentation

## 2019-09-08 DIAGNOSIS — R262 Difficulty in walking, not elsewhere classified: Secondary | ICD-10-CM | POA: Diagnosis present

## 2019-09-08 DIAGNOSIS — I69354 Hemiplegia and hemiparesis following cerebral infarction affecting left non-dominant side: Secondary | ICD-10-CM

## 2019-09-08 DIAGNOSIS — R278 Other lack of coordination: Secondary | ICD-10-CM

## 2019-09-08 DIAGNOSIS — R296 Repeated falls: Secondary | ICD-10-CM

## 2019-09-08 DIAGNOSIS — H547 Unspecified visual loss: Secondary | ICD-10-CM | POA: Insufficient documentation

## 2019-09-08 NOTE — Therapy (Signed)
[place] MAIN [facility] SERVICES [address] [place], [place], [address] Phone: [phone]   Fax:  [phone]  Occupational Therapy Treatment  Patient Details  Name: Kenneth Summers MRN: [id number] Date of Birth: 11/24/1945 No data recorded  Encounter Date: 09/08/2019   OT End of Session - 09/09/19 2134    Visit Number 131    Number of Visits 157    Date for OT Re-Evaluation 09/15/19    Authorization Type Progress report period starting 07/21/2019    OT Start Time 1355    OT Stop Time 1445    OT Time Calculation (min) 50 min    Activity Tolerance Patient tolerated treatment well    Behavior During Therapy [facility] for tasks assessed/performed           Past Medical History:  Diagnosis Date  . Actinic keratosis   . Alcohol abuse   . APS (antiphospholipid syndrome) ([place])   . Basal cell carcinoma   . BPH (benign prostatic hyperplasia)   . CAD (coronary artery disease)   . Cellulitis   . Cervical spinal stenosis   . Chronic osteomyelitis ([place])   . CKD (chronic kidney disease)   . CKD (chronic kidney disease)   . Clostridium difficile diarrhea   . Depression   . Diplopia   . Fatigue   . GERD (gastroesophageal reflux disease)   . Hyperlipemia   . Hypertension   . Hypovitaminosis D   . IBS (irritable bowel syndrome)   . 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 Insomnia   . Left lumbosacral radiculopathy   . Moderate obstructive sleep apnea   . Osteomyelitis of foot ([place])   . PAD (peripheral artery disease) ([place])   . Pruritus   . RA (rheumatoid arthritis) ([place])   . Radiculopathy   . Restless legs syndrome   . RLS (restless legs syndrome)   . SLE (systemic lupus erythematosus related syndrome) ([place])   . 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 Stroke ([place])   . Toxic maculopathy of both eyes     Past Surgical History:  Procedure Laterality Date  . CARDIAC CATHETERIZATION    . CAROTID ENDARTERECTOMY    .  CERVICAL LAMINECTOMY    . CORONARY ANGIOPLASTY    . FRACTURE SURGERY    . fusion C5-6-7    . HERNIA REPAIR    . KNEE ARTHROSCOPY    . TONSILLECTOMY      There were no vitals filed for this visit.   Subjective Assessment - 09/09/19 2133    Subjective  Patient reports he is doing well, tired today but wants to work on a puzzle today    Pertinent History Pt. is a 74 y.o. 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 Pt. PMHx includes: Multiple Falls, Lupus, DJD, Renal Abscess, CVA. Pt. resides with his wife. Pt.'s wife and daughter assist with ADLs. Pt. has caregivers in  for 2 hours a day, 6 days a week. Pt. received Rehab services in acute care, at SNF for STR, and [place] services. Pt. is retired from [employer] for [employer] and [employer].    Patient Stated Goals To regain use of his left UE, and do more for himself.    Currently in Pain? No/denies    Pain Score 0-No pain           Patient seen for visual attention and scanning with  use of cards with scatterpile at tabletop, patient required to scan, pick up and place cards in sequential order and by suits.  Reaching required with use of trunk working on control in multiple directions.  Patient tends to lean to the left side and fatigues quickly.  Towards the end of the task, therapist had to readjust cards closer to patient when fatigued.   Patient requesting to work on Puzzle with tetris shaped pieces, provided with key to how pieces fit together/pattern design.  Patient required min cues to maintain position on key and occasionally when pieces require flipping, turning to fit correctly in the puzzle.  Task required attention, scanning, reaching, coordination as well as perceptual skills.    Response to tx:   Patient fatigued this date after PT session and so task which required increased reaching and trunk control was difficult today.  Patient tended to lean to the left.  Task adjusted to meet the  needs and level of the patient today.  Patient enjoys tasks with a purpose and ending and requested working on a puzzle with a variety of shapes, colors.  He required cues at times for turning or flipping objects and occasionally for finding his place on the key or map of puzzle.  Continue to work towards goals in plan of care to increase independence in daily tasks.                         OT Education - 09/09/19 2133    Education Details reaching, trunk control, left sided awareness, coordination    Person(s) Educated Patient    Methods Explanation;Demonstration    Comprehension Returned demonstration;Tactile cues required;Verbalized understanding;Need further instruction               OT Long Term Goals - 09/01/19 1438      OT LONG TERM GOAL #1   Title Pt. will increase left shoulder flexion AROM by 10 degrees to access cabinet/shelf.    Baseline Pt. continues to progress with shoulder ROM. .Shouler flexion 130, and continues to work on reaching with increasing emphasis on flexing trunk to improve functional reach.  07/21/19- shoulder flexion to 130 with effort    Time 12    Period Weeks    Status Partially Met    Target Date 09/15/19      OT LONG TERM GOAL #2   Title Pt. will donn a shirt with Supervision.    Baseline Pt. continues to require MinA donning a zip down jacket with consistent cues to initiate the correct direction of the jacket.  07/21/19 continues to require min assist at times for shirt/jacket    Time 12    Period Weeks    Status On-going    Target Date 09/15/19      OT LONG TERM GOAL #3   Title Pt. will require ModA to perform LE dressing    Baseline MaxA left, ModA with the right. Pt. has sores on his ankle, and feet. Pt.'s daughter is assisting.    Time 12    Period Weeks    Status Deferred      OT LONG TERM GOAL #4   Title Pt. will improve left grip strength by 10# to be able to open a jar/container.    Baseline Pt. conitnues to  present with limited left hand grip strength. Pt. continues to work towards opening containers, and bottles.    Time 12    Period Weeks      Status On-going    Target Date 09/15/19      OT LONG TERM GOAL #5   Title Pt. will improve left hand FMC skills to be able to assist with buttoning/zipping.    Baseline Pt. is improving with manipulating the zipper on his jacket, however continues to require practice. Pt. continues to work on improving bilateral FMC skills for buttoning/zipping.  07/21/19 improving with buttons and zippers.    Time 12    Period Weeks    Status On-going    Target Date 09/15/19      OT LONG TERM GOAL #6   Title Pt. will demonstrate visual conmpensatory strategies for 100% of the time during ADLs    Baseline Pt. requires  fewer cues for left sides awareness.    Time 12    Period Weeks    Status Partially Met    Target Date 09/15/19      OT LONG TERM GOAL #8   Title Pt. will accurately identify potential safety hazard using good safety awareness, and judgement 100% for ADLs, and IADLs.    Baseline Pt. requires cues    Time 12    Period Weeks    Status On-going    Target Date 09/15/19      OT LONG TERM GOAL  #9   TITLE Pt. will  navigate his w/c around obstacles with Supervision and 100% accuracy    Baseline Pt. continues to require cuing for obstacles on the left, however requires less cues overall. Pt. requires significant cues to engage his left UE during tasks. 07/21/2019 continues to improve with decreased assist at times    Time 12    Period Weeks    Status On-going    Target Date 09/15/19      OT LONG TERM GOAL  #11   TITLE Pt. will increase BUE strength by 2mm grades in preparation for ADL transfers.    Baseline Pt. continues to work towards improving overall strength,    Time 12    Period Weeks    Status On-going    Target Date 09/15/19      OT LONG TERM GOAL  #12   TITLE Pt. will improve LUE functional reaching with the left engaging the trunk  100% of the time during ADL tasks with minimal cues.    Baseline 07/21/2019: Pt. is improving with LUE functional reaching. Pt. conitnues to require cues to engage his trunk for extended reaching.    Time 12    Period Weeks    Status On-going    Target Date 09/15/19                 Plan - 09/09/19 2135    Clinical Impression Statement Patient fatigued this date after PT session and so task which required increased reaching and trunk control was difficult today.  Patient tended to lean to the left.  Task adjusted to meet the needs and level of the patient today.  Patient enjoys tasks with a purpose and ending and requested working on a puzzle with a variety of shapes, colors.  He required cues at times for turning or flipping objects and occasionally for finding his place on the key or map of puzzle.  Continue to work towards goals in plan of care to increase independence in daily tasks.    OT Occupational Profile and History Problem Focused Assessment - Including review of records relating to presenting problem    Occupational performance deficits (Please refer to evaluation   for details): ADL's    Body Structure / Function / Physical Skills UE functional use;Coordination;FMC;Dexterity;Strength;ROM    Cognitive Skills Attention;Memory;Emotional;Problem Solve;Safety Awareness    Rehab Potential Good    Clinical Decision Making Several treatment options, min-mod task modification necessary    Comorbidities Affecting Occupational Performance: Presence of comorbidities impacting occupational performance    Comorbidities impacting occupational performance description: Phyical, cognitive, visual,  medical comorbidities    Modification or Assistance to Complete Evaluation  No modification of tasks or assist necessary to complete eval    OT Frequency 2x / week    OT Duration 12 weeks    OT Treatment/Interventions Self-care/ADL training;DME and/or AE instruction;Therapeutic exercise;Therapeutic  activities;Moist Heat;Cognitive remediation/compensation;Neuromuscular education;Visual/perceptual remediation/compensation;Coping strategies training;Patient/family education;Passive range of motion;Psychosocial skills training;Energy conservation;Functional Mobility Training    Consulted and Agree with Plan of Care Patient           Patient will benefit from skilled therapeutic intervention in order to improve the following deficits and impairments:   Body Structure / Function / Physical Skills: UE functional use, Coordination, FMC, Dexterity, Strength, ROM Cognitive Skills: Attention, Memory, Emotional, Problem Solve, Safety Awareness     Visit Diagnosis: Muscle weakness (generalized)  Other lack of coordination  Hemiplegia and hemiparesis following cerebral infarction affecting left non-dominant side (HCC)  Vision impairment  Cognitive communication deficit    Problem List Patient Active Problem List   Diagnosis Date Noted  . Sepsis ([place]) 10/18/2018   Jaymian Bogart T Pernie Grosso, OTR/L, CLT  Kylee Umana 09/10/2019, 8:45 PM  [place] MAIN [facility] SERVICES [address] [place], [place], [address] Phone: [phone]   Fax:  [phone]  Name: Kenneth Summers MRN: [id number] Date of Birth: Sep 25, 1945

## 2019-09-08 NOTE — Therapy (Signed)
[place] MAIN [facility] SERVICES [address] [place], [place], [address] Phone: [phone]   Fax:  [phone]  Physical Therapy Treatment  Patient Details  Name: Kenneth Summers MRN: [id number] Date of Birth: October 04, 1945 No data recorded  Encounter Date: 09/08/2019   PT End of Session - 09/08/19 1321    Visit Number [id number]    Number of Visits 216    Date for PT Re-Evaluation 11/12/19    Authorization Type Medicare reporting period starting 09/11/18; NO FOTO    Authorization Time Period goals last assessed 08/20/19    PT Start Time 1304    PT Stop Time 1345    PT Time Calculation (min) 41 min    Equipment Utilized During Treatment Gait belt    Activity Tolerance Patient tolerated treatment well;No increased pain    Behavior During Therapy WFL for tasks assessed/performed           Past Medical History:  Diagnosis Date  . Actinic keratosis   . Alcohol abuse   . APS (antiphospholipid syndrome) ([place])   . Basal cell carcinoma   . BPH (benign prostatic hyperplasia)   . CAD (coronary artery disease)   . Cellulitis   . Cervical spinal stenosis   . Chronic osteomyelitis ([place])   . CKD (chronic kidney disease)   . CKD (chronic kidney disease)   . Clostridium difficile diarrhea   . Depression   . Diplopia   . Fatigue   . GERD (gastroesophageal reflux disease)   . Hyperlipemia   . Hypertension   . Hypovitaminosis D   . IBS (irritable bowel syndrome)   . 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 Insomnia   . Left lumbosacral radiculopathy   . Moderate obstructive sleep apnea   . Osteomyelitis of foot ([place])   . PAD (peripheral artery disease) ([place])   . Pruritus   . RA (rheumatoid arthritis) ([place])   . Radiculopathy   . Restless legs syndrome   . RLS (restless legs syndrome)   . SLE (systemic lupus erythematosus related syndrome) ([place])   . 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 Stroke ([place])   . Toxic maculopathy of  both eyes     Past Surgical History:  Procedure Laterality Date  . CARDIAC CATHETERIZATION    . CAROTID ENDARTERECTOMY    . CERVICAL LAMINECTOMY    . CORONARY ANGIOPLASTY    . FRACTURE SURGERY    . fusion C5-6-7    . HERNIA REPAIR    . KNEE ARTHROSCOPY    . TONSILLECTOMY      There were no vitals filed for this visit.   Subjective Assessment - 09/08/19 1320    Subjective Patient reports doing well; He reports trying to do leg kicks over the weekend but had a hard time because his leg felt heavy. He did not do any standing over the weekend; He has gotten his new hoyer lift and its working well; no pain today;    Patient is accompained by: Family member    Pertinent History Patient is a 74 y.o. male who presents to outpatient physical therapy with a referral for medical diagnosis of CVA. This patient's chief complaints consist of left hemiplegia and overall deconditioning leading to the following functional deficits: dependent for ADLs, IADLs, unable to transfer to car, dependent transfers at home with hoyer lift, unable to walk or stand without significant assistance, difficulty with W/C navigation.Marland Kitchen  Limitations Sitting;Lifting;Standing;House hold activities;Writing;Walking;Other (comment)    Currently in Pain? No/denies              TREATMENT: Prior to transfersand in between transfers: Patient sittingin wheelchair: -hip flexion marchx15reps each LE with verbal/visual cues to increase AROM; -LLE only LAQ2x15 Reinforced HEP with instruction to focus on isolating one leg at a time to help with disassociation;also reinstructed patient in neutral sitting posture to avoid left lateral lean;  Ptrequires verbal and visual cues to improve ROM;He was able to exhibit improved LLE AROM with cues; Patient also requires cues for erect posture for better trunk control;  Following exercise: Transferred sit<>Stand in parallel bars (seat adjusted to26inch from floor  height) x8repwith min A- CGA;herequiredmodVCs for forward weight shift for better trunk control and transfer ability; Patient able to exhibit better transfer ability with increased forward weight shift but then starts falling backwards requiring cues to increase forward trunk progression and hip extension;    Standing in parallel bars: Standing,min Awith min VCs for weight shift and increased extension for better stance control; -Mini squatx10repswith CGA and min VCs for weight shift and to increase LE muscle activation; -Heel raises x10 reps;  -unsupported standing in parallel bars with CGA for safety x10 sec hold;  -standing with neutral weight shift x2 reps; -RLE toe taps to 4 inch step with B Rail assist, min A for safety x3 reps; Attempted LLE but patient has difficulty achieving neutral weight shift and increasing hip extension;    Patient able to progress transfers this session to supervision at 26 inch height surface. He continues to require cues for weight shift and proper trunk control including to increase hip extension in stance.   Patient requiredmodA to scoot back in chair at end of session due to increased fatigue    Response to treatment: Patient tolerated session well. He was able to progress with better trunk control and neutral weight shift with increased activity. He also exhibits improved LLE AROM with increased activity. Patient does report increased fatigue at end of session.He reports increased fatigue in LLE and states that he feels more soreness in LLE which is a good indicator that he is getting more sensation in LLE;                         PT Education - 09/08/19 1320    Education Details transfers/positioning, HEP    Person(s) Educated Patient    Methods Explanation;Verbal cues    Comprehension Verbalized understanding;Returned demonstration;Verbal cues required;Need further instruction            PT  Short Term Goals - 08/18/19 1305      PT SHORT TERM GOAL #1   Title Be independent with initial home exercise program for self-management of symptoms.    Baseline HEP to be given at second session; advice to practice unsupported sitting at home (06/19/2018); 07/08/18: Performing at home, 6/29: needs assistance but is doing exercise program regularly; 08/22/18: adherent;    Time 2    Period Weeks    Status Achieved    Target Date 06/26/18             PT Long Term Goals - 08/20/19 1456      PT LONG TERM GOAL #1   Title Patient will complete all bed mobility with min A to improve functional independence for getting in and out of bed and adjusting in bed.     Baseline max A -  total A reported by family (06/19/2018); 07/08/18: still requires maxA, dtr reports improvement in rolling since starting therapy; 6/23: min A to supervision in clinic; not doing at home right; 8/5: Pt's daughter states that his rolling has improved, but still has difficulty with all other bed mobility; 10/23/18: pts daughter states that she is doing about 35% of the work if he is in proper position, he does uses the bed rails to help roll;  using Hoyer for coming to sit EOB; 10/12: pts daughter states that she is doing about 30% of the work if he is in proper position, he does uses the bed rails to help roll, but it is improving;  using Hoyer for coming to sit EOB, 10/29: min A for sit to sidelying, rolling supervision, able to transition sidelying to sit with HHA;    Time 12    Period Weeks    Status Achieved      PT LONG TERM GOAL #2   Title Patient will complete sit <> stand transfer chair to chair with Min A and LRAD to improve functional independence for household and community mobility.     Baseline supervision for STS, still requires elevated surface height to 27"    Time 12    Period Weeks    Status Achieved      PT LONG TERM GOAL #3   Title Patient will navigate power w/c with min A x 100 feet to improve mobility  for household and short community distances.    Baseline required total A (06/12/2018); able to roll 20 feet with min A (06/20/2018); 07/08/18: Pt can go approximately 10' without assist per family;  10/23/2018: pt's daughter states that his power WC mobility has become 100% better with the new hand control, slowed speed, and with practice; 10/12: pt's daughter reports that he can perform household WC mobility and limited community ambulation with minA    Time 12    Period Weeks    Status Achieved      PT LONG TERM GOAL #4   Title Patient will complete car transfers W/C to family SUV with min A using LRAD to improve his abilty to participate in community activities.     Baseline unable to complete car transfers at all (06/12/2018); unable to complete car transfers at all (06/19/2018).; 07/08/18: Unable to perform at this time; 09/11/18: unable to perform at this time; 10/23/2018: unable to complete car transfers at all; 10/12:  unable to complete car transfers at all, 10/29: unable; 01/13/19: unable; 2/11: min-mod A +2, 3/10: min A+1    Time 12    Period Weeks    Status Achieved      PT LONG TERM GOAL #5   Title Patient will ambulate at least 10 feet with min A using LRAD to improve mobility for household and community distances.    Baseline unable to take steps (06/12/2018); able to shift R leg forward and back with max A and RW at edge of plinth (06/19/2018); 07/08/18: unable to ambulate at this time. 09/11/18: unable to perform at this time; 10/23/2018: unable to perform at this time; 10/12: unable to perform at this time, 10/29: unable at this time; 01/13/19: unable at this time, 2/11: min A to step with RLE, unable to step with LLE, 3/10: able to take 2 steps in parallel bars with min A for safety; 4/1: min A for walking 10 feet in parallel bars, 5/24: min A walking 10 feet in parallel bars; 6/21: no change  7/14: no change    Time 12    Period Weeks    Status Partially Met    Target Date 11/12/19      PT LONG TERM  GOAL #6   Title Patient will be able to safety navigate up/down ramp with power chair, exhibiting good safety awareness, mod I to safely enter/exit his home.    Baseline 2/11: supervision with intermittent min A;4/1: supervision; 5/24: supervision    Time 12    Period Weeks    Status Partially Met    Target Date 11/12/19      PT LONG TERM GOAL #7   Title Patient will increase functional reach test to >15 inches in sitting to exhibit improved sitting balance and positioning and reduce fall risk;    Baseline 07/30/18: 12 inches; 09/11/18: 10-12 inches; 10/23/18: 12-13 inch; 10/12: 14.5", 10/29: 15.5 inch    Time 4    Period Weeks    Status Achieved      PT LONG TERM GOAL #8   Title Patient will increase LLE quad strength to 3+/5 to improve functional strength for standing and mobility;    Baseline 10/28: 2+/5; 01/13/19: 2+/5, 2/11: 3-/5, 04/16/19: 3-/5, 05/08/19: 3-/5, 5/24: 3-/5, 6/21: 3-/5, 7/14: 3-/5    Time 12    Period Weeks    Status Partially Met    Target Date 11/12/19      PT LONG TERM GOAL  #9   TITLE Patient will report no vertigo with provoking motions or positions in order to be able to perform ADLs and mobility tasks without symptoms.    Baseline 06/30/19: reports no dizziness in last week; 6/21: one episode this weekend; 7/14: experiencing one a week;    Time 12    Period Weeks    Status Partially Met    Target Date 11/12/19                 Plan - 09/09/19 0905    Clinical Impression Statement Patient motivated and progressing well. Continued to instruct patient in sit<>Stand transfers from lower height surface of 26 inches. He consistently requires CGA with tactile cues for forward trunk lean. He is able to exhibit better quad activation and improve initial transfer but has difficulty with hip extension and forward weight shift during terminal stand for better balance control. Patient instructed in advanced LE strengthening exercise. He continues to fatigue quickly  however required fewer rest breaks this session. Patient would benefit from additional skilled PT Intervention to improve strength, balance and mobility;    Personal Factors and Comorbidities Age;Comorbidity 3+    Comorbidities Relevant past medical history and comorbidities include long term steroid use for lupus, CKD, chronic osteomyelitis, cervical spine stenosis, BPH, APS, alcohol abuse, peripheral artery disease, depression, GERD, obstructive sleep apnea, HTN, IBS, IgA deficiency, lumbar radiculopathy, RA, Stroke, toxic maculopathy in both eyes, systemic lupus erythematosus related syndrome, cardiac catheterization, carotid endarterectomy, cervical laminectomy, coronary angioplasty, Fusion C5-C7, knee arthroplasty, lumbar surgery.    Examination-Activity Limitations Bed Mobility;Bend;Sit;Toileting;Stand;Stairs;Lift;Transfers;Squat;Locomotion Level;Carry;Dressing;Hygiene/Grooming;Continence    Examination-Participation Restrictions Yard Work;Interpersonal Relationship;Community Activity    Stability/Clinical Decision Making Evolving/Moderate complexity    Rehab Potential Fair    PT Frequency 3x / week    PT Duration 12 weeks    PT Treatment/Interventions ADLs/Self Care Home Management;Electrical Stimulation;Moist Heat;Cryotherapy;Gait training;Stair training;Functional mobility training;Therapeutic exercise;Balance training;Neuromuscular re-education;Cognitive remediation;Patient/family education;Orthotic Fit/Training;Wheelchair mobility training;Manual techniques;Passive range of motion;Energy conservation;Joint Manipulations;Canalith Repostioning;Therapeutic activities;Vestibular    PT Next Visit Plan Work on strength, sitting/standing balance,  functional activities such as rolling, bed mobility, transfers; caregiver training    PT Home Exercise Plan no updates this session    Consulted and Agree with Plan of Care Patient;Family member/caregiver    Family Member Consulted Wife            Patient will benefit from skilled therapeutic intervention in order to improve the following deficits and impairments:  Abnormal gait, Decreased activity tolerance, Decreased cognition, Decreased endurance, Decreased knowledge of use of DME, Decreased range of motion, Decreased skin integrity, Decreased strength, Impaired perceived functional ability, Impaired sensation, Impaired UE functional use, Improper body mechanics, Pain, Cardiopulmonary status limiting activity, Decreased balance, Decreased coordination, Decreased mobility, Difficulty walking, Impaired tone, Postural dysfunction, Dizziness  Visit Diagnosis: Muscle weakness (generalized)  Other lack of coordination  Difficulty in walking, not elsewhere classified  Repeated falls     Problem List Patient Active Problem List   Diagnosis Date Noted  . Sepsis ([place]) 10/18/2018    Jaydalyn Demattia PT, DPT 09/09/2019, 9:07 AM  [place] MAIN [facility] SERVICES [address] [place], [place], [address] Phone: [phone]   Fax:  [phone]  Name: BROLIN DAMBROSIA MRN: [id number] Date of Birth: May 31, 1945

## 2019-09-10 ENCOUNTER — Encounter: Payer: Medicare Other | Admitting: Occupational Therapy

## 2019-09-10 ENCOUNTER — Other Ambulatory Visit: Payer: Self-pay

## 2019-09-10 ENCOUNTER — Ambulatory Visit: Payer: Medicare Other | Admitting: Physical Therapy

## 2019-09-11 ENCOUNTER — Ambulatory Visit: Payer: Medicare Other | Admitting: Occupational Therapy

## 2019-09-11 ENCOUNTER — Ambulatory Visit: Payer: Medicare Other | Admitting: Physical Therapy

## 2019-09-13 ENCOUNTER — Other Ambulatory Visit: Payer: Self-pay

## 2019-09-13 ENCOUNTER — Emergency Department
Admission: EM | Admit: 2019-09-13 | Discharge: 2019-09-13 | Disposition: A | Discharge status: Home / Self Care | Payer: Medicare Other | Attending: Emergency Medicine | Admitting: Emergency Medicine

## 2019-09-13 ENCOUNTER — Emergency Department: Payer: Medicare Other

## 2019-09-13 DIAGNOSIS — Z8673 Personal history of transient ischemic attack (TIA), and cerebral infarction without residual deficits: Secondary | ICD-10-CM | POA: Insufficient documentation

## 2019-09-13 DIAGNOSIS — L03116 Cellulitis of left lower limb: Secondary | ICD-10-CM | POA: Insufficient documentation

## 2019-09-13 DIAGNOSIS — I129 Hypertensive chronic kidney disease with stage 1 through stage 4 chronic kidney disease, or unspecified chronic kidney disease: Secondary | ICD-10-CM | POA: Diagnosis not present

## 2019-09-13 DIAGNOSIS — Z955 Presence of coronary angioplasty implant and graft: Secondary | ICD-10-CM | POA: Diagnosis not present

## 2019-09-13 DIAGNOSIS — Z7982 Long term (current) use of aspirin: Secondary | ICD-10-CM | POA: Insufficient documentation

## 2019-09-13 DIAGNOSIS — N189 Chronic kidney disease, unspecified: Secondary | ICD-10-CM | POA: Insufficient documentation

## 2019-09-13 DIAGNOSIS — Z87891 Personal history of nicotine dependence: Secondary | ICD-10-CM | POA: Diagnosis not present

## 2019-09-13 DIAGNOSIS — M7989 Other specified soft tissue disorders: Secondary | ICD-10-CM

## 2019-09-13 DIAGNOSIS — Z79899 Other long term (current) drug therapy: Secondary | ICD-10-CM | POA: Insufficient documentation

## 2019-09-13 DIAGNOSIS — I2510 Atherosclerotic heart disease of native coronary artery without angina pectoris: Secondary | ICD-10-CM | POA: Diagnosis not present

## 2019-09-13 LAB — CBC WITH DIFFERENTIAL/PLATELET
Abs Immature Granulocytes: 0.03 10*3/uL (ref 0.00–0.07)
Basophils Absolute: 0 10*3/uL (ref 0.0–0.1)
Basophils Relative: 0 %
Eosinophils Absolute: 0.1 10*3/uL (ref 0.0–0.5)
Eosinophils Relative: 1 %
HCT: 41.6 % (ref 39.0–52.0)
Hemoglobin: 13.2 g/dL (ref 13.0–17.0)
Immature Granulocytes: 0 %
Lymphocytes Relative: 28 %
Lymphs Abs: 2.5 10*3/uL (ref 0.7–4.0)
MCH: 28.6 pg (ref 26.0–34.0)
MCHC: 31.7 g/dL (ref 30.0–36.0)
MCV: 90 fL (ref 80.0–100.0)
Monocytes Absolute: 0.9 10*3/uL (ref 0.1–1.0)
Monocytes Relative: 10 %
Neutro Abs: 5.4 10*3/uL (ref 1.7–7.7)
Neutrophils Relative %: 61 %
Platelets: 199 10*3/uL (ref 150–400)
RBC: 4.62 MIL/uL (ref 4.22–5.81)
RDW: 14.7 % (ref 11.5–15.5)
WBC: 9 10*3/uL (ref 4.0–10.5)
nRBC: 0 % (ref 0.0–0.2)

## 2019-09-13 LAB — COMPREHENSIVE METABOLIC PANEL
ALT: 20 U/L (ref 0–44)
AST: 23 U/L (ref 15–41)
Albumin: 3.7 g/dL (ref 3.5–5.0)
Alkaline Phosphatase: 84 U/L (ref 38–126)
Anion gap: 10 (ref 5–15)
BUN: 21 mg/dL (ref 8–23)
CO2: 26 mmol/L (ref 22–32)
Calcium: 9.1 mg/dL (ref 8.9–10.3)
Chloride: 101 mmol/L (ref 98–111)
Creatinine, Ser: 1.06 mg/dL (ref 0.61–1.24)
GFR calc Af Amer: >60 mL/min (ref >60)
GFR calc non Af Amer: >60 mL/min (ref >60)
Glucose, Bld: 108 mg/dL — ABNORMAL HIGH (ref 70–99)
Potassium: 4.3 mmol/L (ref 3.5–5.1)
Sodium: 137 mmol/L (ref 135–145)
Total Bilirubin: 1.1 mg/dL (ref 0.3–1.2)
Total Protein: 7.7 g/dL (ref 6.5–8.1)

## 2019-09-13 LAB — SEDIMENTATION RATE: Sed Rate: 42 mm/hr — ABNORMAL HIGH (ref 0–20)

## 2019-09-13 MED ORDER — CEPHALEXIN 500 MG PO CAPS: 500.0000 mg | ORAL_CAPSULE | Freq: Four times a day (QID) | ORAL | 0 refills | Status: AC

## 2019-09-13 MED ORDER — CEPHALEXIN 500 MG PO CAPS
500.0000 mg | ORAL_CAPSULE | Freq: Once | ORAL | Status: AC
  Administered 2019-09-13: 500 mg via ORAL
  Filled 2019-09-13: qty 1

## 2019-09-13 NOTE — ED Triage Notes (Signed)
Pt via EMS from home. Pt has a hx of stroke with L-sided deficits. Pt hit his L foot while riding in his wheelchair on Sunday. Pt went to [facility] on Monday and states there was not any fracture. Denies pain but L foot is swollen, red, and warm to the tough. Pt is A&Ox4 and NAD.

## 2019-09-13 NOTE — ED Notes (Addendum)
Family member crying at the bedside, asking for help.  She states that her fathers foot is three times the size it was and she is very concerned.  Foot appears swollen and bluish, CT reviewed and message sent to RN.  Advised family member that RN will follow up with her.

## 2019-09-13 NOTE — ED Notes (Addendum)
Pt's daughter bedside upset about pt's condition stating 'foot is worse than before". EDP notified of pt's daughters concerns.  Pedal pulses checked via doppler by this RN, pulses equal bilaterally. Pitting edema noted in L foot, pt states worse swelling and increased difficulty moving toes on L foot. Increase in pain denied by pt.  EDP offered admission to pt, pt denied admission stating "I want to go home". This RN notified wife of transport home and offered admission, pt's wife states she is able to care for him at home and to continue with EMS transport. Daughter educated by this RN about abx and follow up care, pt's daughter states understaning.

## 2019-09-13 NOTE — ED Notes (Signed)
Per daughter, pt is unable to get up or stand. Pt is hoyer lift dependent.

## 2019-09-13 NOTE — ED Notes (Signed)
Offered pt food and drink at this time. Pt refused at this time.
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
## 2019-09-13 NOTE — ED Provider Notes (Signed)
[name] daughter says her foot is much worse and she is very worried.  I could not get to see him immediately but when I did see him he says his foot does seem to be worse but is not really very painful.  It is red and swollen this redness and swelling goes up just above the ankle.  I have drawn a line around the edge of the redness and swelling.  Capillary refill on the whole foot is intact and brisk.  Patient reports the foot is not really any more painful than it has been.  He feels comfortable going home.  He has his medicines.  He promises to return if the redness progresses past the line that I just drew around the edge of it.  He will also come back for increasing pain or swelling or any fever or other problems.  He has his wife at home.  I cannot see his daughter we have been looking for for the last few minutes but she is not here.  I will speak with her if she does come back.  Ambulance will take him home in the meantime.   Nena Polio, MD 09/13/19 2052

## 2019-09-15 ENCOUNTER — Ambulatory Visit: Payer: Medicare Other | Admitting: Physical Therapy

## 2019-09-15 ENCOUNTER — Ambulatory Visit: Payer: Medicare Other | Admitting: Occupational Therapy

## 2019-09-17 ENCOUNTER — Ambulatory Visit: Payer: Medicare Other | Admitting: Physical Therapy

## 2019-09-18 ENCOUNTER — Ambulatory Visit: Payer: Medicare Other | Admitting: Physical Therapy

## 2019-09-18 ENCOUNTER — Ambulatory Visit: Payer: Medicare Other | Admitting: Occupational Therapy

## 2019-09-22 ENCOUNTER — Ambulatory Visit: Payer: Medicare Other | Admitting: Occupational Therapy

## 2019-09-22 ENCOUNTER — Encounter: Payer: Self-pay | Admitting: Physical Therapy

## 2019-09-22 ENCOUNTER — Ambulatory Visit: Payer: Medicare Other | Admitting: Physical Therapy

## 2019-09-22 ENCOUNTER — Other Ambulatory Visit: Payer: Self-pay

## 2019-09-22 ENCOUNTER — Encounter: Payer: Self-pay | Admitting: Occupational Therapy

## 2019-09-22 DIAGNOSIS — R278 Other lack of coordination: Secondary | ICD-10-CM

## 2019-09-22 DIAGNOSIS — M6281 Muscle weakness (generalized): Secondary | ICD-10-CM | POA: Diagnosis not present

## 2019-09-22 DIAGNOSIS — R262 Difficulty in walking, not elsewhere classified: Secondary | ICD-10-CM

## 2019-09-22 NOTE — Therapy (Signed)
[place] MAIN [facility] SERVICES [address] [place], [place], [address] Phone: [phone]   Fax:  [phone]  Occupational Therapy Treatment/Recertification Note  Patient Details  Name: Kenneth Summers MRN: [id number] Date of Birth: Dec 05, 1945 No data recorded  Encounter Date: 09/22/2019   OT End of Session - 09/22/19 1358    Visit Number 132    Number of Visits 157    Date for OT Re-Evaluation 12/15/19    Authorization Type Progress report period starting 07/21/2019    OT Start Time 1350    OT Stop Time 1430    OT Time Calculation (min) 40 min    Activity Tolerance Patient tolerated treatment well    Behavior During Therapy [facility] for tasks assessed/performed           Past Medical History:  Diagnosis Date  . Actinic keratosis   . Alcohol abuse   . APS (antiphospholipid syndrome) ([place])   . Basal cell carcinoma   . BPH (benign prostatic hyperplasia)   . CAD (coronary artery disease)   . Cellulitis   . Cervical spinal stenosis   . Chronic osteomyelitis ([place])   . CKD (chronic kidney disease)   . CKD (chronic kidney disease)   . Clostridium difficile diarrhea   . Depression   . Diplopia   . Fatigue   . GERD (gastroesophageal reflux disease)   . Hyperlipemia   . Hypertension   . Hypovitaminosis D   . IBS (irritable bowel syndrome)   . 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 Insomnia   . Left lumbosacral radiculopathy   . Moderate obstructive sleep apnea   . Osteomyelitis of foot ([place])   . PAD (peripheral artery disease) ([place])   . Pruritus   . RA (rheumatoid arthritis) ([place])   . Radiculopathy   . Restless legs syndrome   . RLS (restless legs syndrome)   . SLE (systemic lupus erythematosus related syndrome) ([place])   . 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 Stroke ([place])   . Toxic maculopathy of both eyes     Past Surgical History:  Procedure Laterality Date  . CARDIAC CATHETERIZATION    . CAROTID  ENDARTERECTOMY    . CERVICAL LAMINECTOMY    . CORONARY ANGIOPLASTY    . FRACTURE SURGERY    . fusion C5-6-7    . HERNIA REPAIR    . KNEE ARTHROSCOPY    . TONSILLECTOMY      There were no vitals filed for this visit.   Subjective Assessment - 09/22/19 1357    Subjective  Patient reports he is doing well, tired today but wants to work on a puzzle today    Patient is accompanied by: Family member    Pertinent History Pt. is a 74 y.o. 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 Pt. PMHx includes: Multiple Falls, Lupus, DJD, Renal Abscess, CVA. Pt. resides with his wife. Pt.'s wife and daughter assist with ADLs. Pt. has caregivers in  for 2 hours a day, 6 days a week. Pt. received Rehab services in acute care, at SNF for STR, and [place] services. Pt. is retired from [employer] for [employer] and [employer].    Patient Stated Goals To regain use of his left UE, and do more for himself.    Currently in Pain? No/denies              [facility] OT  Assessment - 09/22/19 1400      Assessment   Medical Diagnosis --    Onset Date/Surgical Date --    Hand Dominance --    Next MD Visit --    Prior Therapy --      Precautions   Precautions --    Precaution Comments --    Required Braces or Orthoses --      Restrictions   Weight Bearing Restrictions --      Balance Screen   Has the patient fallen in the past 6 months --    How many times? --    Has the patient had a decrease in activity level because of a fear of falling?  --    Is the patient reluctant to leave their home because of a fear of falling?  --      Home  Environment   Family/patient expects to be discharged to: --    Living Arrangements --    Type of Home --    Home Access --    Home Layout --    Bathroom Shower/Tub --    Biochemist, clinical --    Architectural technologist --    How accessible --    Adaptive equipment --    Home Equipment --    Lives With --      Prior Function    Level of Independence --    Vocation --    Vocation Requirements --    Leisure --      ADL   Eating/Feeding --   Assist for set-up, and items cut/prepared    Grooming --    Grooming Patient Percentage --    Upper Body Bathing --    Lower Body Bathing --    Upper Body Dressing --    Lower Body Dressing --    Toilet Transfer --   Development worker, community - Water quality scientist --    [place] --    Clinical cytogeneticist --      IADL   Prior Level of Function Shopping --    Shopping --    Prior Level of Function Light Housekeeping --    Light Housekeeping --    Prior Level of Function Meal Prep --    Meal Prep --    Community Mobility --    Medication Management --    Prior Level of Function Financial Management --    Financial Management --      Mobility   Mobility Status --      Written Expression   Handwriting --      Vision - History   Baseline Vision --      Vision Assessment   Diplopia Assessment --      Activity Tolerance   Activity Tolerance --      Cognition   Overall Cognitive Status --    Area of Impairment --    Attention --    Memory --      Sensation   Light Touch Appears Intact    Proprioception Appears Intact      Coordination   Right 9 Hole Peg Test 39 sec.    Left 9 Hole Peg Test 4 min. 37 sec.    Coordination --   impaired     AROM   Overall AROM Comments Left shoulder flexion: 130, abduction 96, elbow 4-142, wrist extension 58      Strength   Overall Strength Comments LUE strength [place]  flexion 3+/5, abduction 3/5, elbow flexion 4/5 wrist extension 4+/5     Right Hip Flexion --    Right Hip ABduction --    Left Hip Flexion --    Left Hip Extension --    Left Hip ABduction --    Left Hip ADduction --    Right Knee Flexion --    Right Knee Extension --    Left Knee Flexion --    Left Knee Extension --      Hand Function   Right Hand Grip (lbs) 38    Right Hand Lateral Pinch 12 lbs    Right Hand 3 Point Pinch 8  lbs    Left Hand Grip (lbs) 30    Left Hand Lateral Pinch 7 lbs    Left 3 point pinch 8 lbs           Pt. continues to make steady progress. Pt. has been unable to attempt therapy last week secondary to having increased edema in his foot/toe after bumping it into a wall while maneuvering his w/c at home. Pt. Presents with increased weakness, and decreased left hand [facility] skills this week. Pt. continues to present with limited left sided awareness, BUE strength, and [facility] skills which hinder his ability to actively, and efficiently engage in ADLs, and IADL tasks. Pt. continues to work on improving LUE functioning in order to work towards improving, and maximizing independence with ADLs, and IADL tasks.                  OT Education - 09/22/19 1357    Education Details reaching, trunk control, left sided awareness, coordination    Person(s) Educated Patient    Methods Explanation;Demonstration    Comprehension Returned demonstration;Tactile cues required;Verbalized understanding;Need further instruction               OT Long Term Goals - 09/22/19 1400      OT LONG TERM GOAL #1   Title Pt. will increase left shoulder flexion AROM by 10 degrees to access cabinet/shelf.    Baseline Pt. continues to progress with shoulder ROM. Marland KitchenShouler flexion 130, and continues to work on reaching with increasing emphasis on flexing trunk to improve functional reach.  07/21/19- shoulder flexion to 130 with effort    Time 12    Period Weeks    Status Partially Met    Target Date 12/15/19      OT LONG TERM GOAL #2   Title Pt. will donn a shirt with Supervision.    Baseline Pt. continues to require MinA donning a zip down jacket with consistent cues to initiate the correct direction of the jacket.  07/21/19 continues to require min assist at times for shirt/jacket    Time 12    Period Weeks    Status On-going    Target Date 12/15/19      OT LONG TERM GOAL #3   Title Pt. will require ModA to  perform LE dressing    Baseline MaxA left, ModA with the right. Pt. has sores on his ankle, and feet. Pt.'s daughter is assisting.    Time 12    Period Weeks    Status Deferred      OT LONG TERM GOAL #4   Title Pt. will improve left grip strength by 10# to be able to open a jar/container.    Baseline Pt. conitnues to present with limited left hand grip strength. Pt. continues to work towards opening containers, and bottles.  Time 12    Period Weeks    Status On-going    Target Date 12/15/19      OT LONG TERM GOAL #5   Title Pt. will improve left hand [facility] skills to be able to assist with buttoning/zipping.    Baseline Pt. is improving with manipulating the zipper on his jacket, however continues to require practice. Pt. continues to work on improving bilateral [facility] skills for buttoning/zipping.  07/21/19 improving with buttons and zippers.    Time 12    Period Weeks    Status On-going    Target Date 12/15/19      OT LONG TERM GOAL #6   Title Pt. will demonstrate visual conmpensatory strategies for 100% of the time during ADLs    Baseline Pt.continues to rerequire intermitten cues cues for left sides awareness.    Time 12    Period Weeks    Status Partially Met    Target Date 12/15/19      OT LONG TERM GOAL #7   Title Pt. will prepare a simple cold snack from w/c with supervision using cognitive compensatory strategies 100% of the time.    Baseline Pt. has difficulty with getting the w/c close enough to access the refrigerator    Time 12    Period Weeks    Status Deferred      OT LONG TERM GOAL #8   Title Pt. will accurately identify potential safety hazard using good safety awareness, and judgement 100% for ADLs, and IADLs.    Baseline Pt. requires cues    Time 12    Period Weeks    Status On-going    Target Date 12/15/19      OT LONG TERM GOAL  #9   TITLE Pt. will  navigate his w/c around obstacles with Supervision and 100% accuracy    Baseline Pt. continues to require  cuing for obstacles on the left, however requires less cues overall. Pt. requires significant cues to engage his left UE during tasks. 07/21/2019 continues to improve with decreased assist at times    Time 12    Period Weeks    Status On-going    Target Date 12/15/19      OT LONG TERM GOAL  #10   TITLE Pt. will independently be able  to reach to place items into cabinetry, and closets.    Baseline Pt. BUE strength, and reaching  is limited.    Time 12    Period Weeks    Status New    Target Date 12/15/19      OT LONG TERM GOAL  #11   TITLE Pt. will increase BUE strength by 67m grades in preparation for ADL transfers.    Baseline Pt. continues to work towards improving overall strength,    Time 12    Period Weeks    Status On-going    Target Date 12/15/19      OT LONG TERM GOAL  #12   TITLE Pt. will improve LUE functional reaching with the left engaging the trunk 100% of the time during ADL tasks with minimal cues.    Baseline 07/21/2019: Pt. is improving with LUE functional reaching. Pt. conitnues to require cues to engage his trunk for extended reaching.    Time 12    Period Weeks    Status On-going    Target Date 12/15/19                 Plan - 09/22/19 1359  Clinical Impression Statement Pt. continues to make steady progress. Pt. has been unable to attempt therapy last week secondary to having increased edema in his foot/toe after bumping it into a wall while maneuvering his w/c at home. Pt. Presents with increased weakness, and decreased left hand [facility] skills this week. Pt. continues to present with limited left sided awareness, BUE strength, and [facility] skills which hinder his ability to actively, and efficiently engage in ADLs, and IADL tasks. Pt. continues to work on improving LUE functioning in order to work towards improving, and maximizing independence with ADLs, and IADL tasks.   OT Occupational Profile and History Problem Focused Assessment - Including review of  records relating to presenting problem    Occupational performance deficits (Please refer to evaluation for details): ADL's    Body Structure / Function / Physical Skills UE functional use;Coordination;FMC;Dexterity;Strength;ROM    Cognitive Skills Attention;Memory;Emotional;Problem Solve;Safety Awareness    Rehab Potential Good    Clinical Decision Making Several treatment options, min-mod task modification necessary    Comorbidities Affecting Occupational Performance: Presence of comorbidities impacting occupational performance    Modification or Assistance to Complete Evaluation  No modification of tasks or assist necessary to complete eval    OT Frequency 2x / week    OT Duration 12 weeks    OT Treatment/Interventions Self-care/ADL training;DME and/or AE instruction;Therapeutic exercise;Therapeutic activities;Moist Heat;Cognitive remediation/compensation;Neuromuscular education;Visual/perceptual remediation/compensation;Coping strategies training;Patient/family education;Passive range of motion;Psychosocial skills training;Energy conservation;Functional Mobility Training    Consulted and Agree with Plan of Care Patient    Family Member Consulted Wife           Patient will benefit from skilled therapeutic intervention in order to improve the following deficits and impairments:   Body Structure / Function / Physical Skills: UE functional use, Coordination, FMC, Dexterity, Strength, ROM Cognitive Skills: Attention, Memory, Emotional, Problem Solve, Safety Awareness     Visit Diagnosis: Muscle weakness (generalized)  Other lack of coordination    Problem List Patient Active Problem List   Diagnosis Date Noted  . Sepsis ([place]) 10/18/2018    Harrel Carina, MS, OTR/L 09/22/2019, 3:37 PM  [place] MAIN [facility] SERVICES [address] [place], [place], [address] Phone: [phone]   Fax:  [phone]  Name: Kenneth Summers MRN:  [id number] Date of Birth: 10-18-1945

## 2019-09-22 NOTE — Therapy (Signed)
[place] MAIN [facility] SERVICES [address] [place], [place], [address] Phone: [phone]   Fax:  [phone]  Physical Therapy Treatment  Patient Details  Name: Kenneth Summers MRN: [id number] Date of Birth: Nov 21, 1945 No data recorded  Encounter Date: 09/22/2019   PT End of Session - 09/22/19 1401    Visit Number [id number]    Number of Visits 216    Date for PT Re-Evaluation 11/12/19    Authorization Type Medicare reporting period starting 09/11/18; NO FOTO    Authorization Time Period goals last assessed 08/20/19    PT Start Time 1302    PT Stop Time 1345    PT Time Calculation (min) 43 min    Equipment Utilized During Treatment Gait belt    Activity Tolerance Patient tolerated treatment well;No increased pain    Behavior During Therapy WFL for tasks assessed/performed           Past Medical History:  Diagnosis Date  . Actinic keratosis   . Alcohol abuse   . APS (antiphospholipid syndrome) ([place])   . Basal cell carcinoma   . BPH (benign prostatic hyperplasia)   . CAD (coronary artery disease)   . Cellulitis   . Cervical spinal stenosis   . Chronic osteomyelitis ([place])   . CKD (chronic kidney disease)   . CKD (chronic kidney disease)   . Clostridium difficile diarrhea   . Depression   . Diplopia   . Fatigue   . GERD (gastroesophageal reflux disease)   . Hyperlipemia   . Hypertension   . Hypovitaminosis D   . IBS (irritable bowel syndrome)   . 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 Insomnia   . Left lumbosacral radiculopathy   . Moderate obstructive sleep apnea   . Osteomyelitis of foot ([place])   . PAD (peripheral artery disease) ([place])   . Pruritus   . RA (rheumatoid arthritis) ([place])   . Radiculopathy   . Restless legs syndrome   . RLS (restless legs syndrome)   . SLE (systemic lupus erythematosus related syndrome) ([place])   . 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 Stroke ([place])   . Toxic maculopathy of  both eyes     Past Surgical History:  Procedure Laterality Date  . CARDIAC CATHETERIZATION    . CAROTID ENDARTERECTOMY    . CERVICAL LAMINECTOMY    . CORONARY ANGIOPLASTY    . FRACTURE SURGERY    . fusion C5-6-7    . HERNIA REPAIR    . KNEE ARTHROSCOPY    . TONSILLECTOMY      There were no vitals filed for this visit.   Subjective Assessment - 09/22/19 1359    Subjective Patient reports doing well. He did follow up with podiatrist who states he is okay for activity within comfort; Pt was diagnosed with large hematoma on LLE ankle. Swelling has significantly improved. He denies any pain in ankle. he reports adherence with HEP states he has been working on leg kicks;    Patient is accompained by: Family member    Pertinent History Patient is a 74 y.o. male who presents to outpatient physical therapy with a referral for medical diagnosis of CVA. This patient's chief complaints consist of left hemiplegia and overall deconditioning leading to the following functional deficits: dependent for ADLs, IADLs, unable to transfer to car, dependent transfers at home with hoyer lift, unable to walk or stand without significant assistance, difficulty with W/C  navigation..    Limitations Sitting;Lifting;Standing;House hold activities;Writing;Walking;Other (comment)    Diagnostic tests Brain MRI 11/01/2017: IMPRESSION: 1. Acute right ACA territory infarct as demonstrated on prior CT imaging. No intracranial hemorrhage or significant mass effect. 2. 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    Patient Stated Goals wants to be able to walk again    Currently in Pain? No/denies    Pain Onset More than a month ago    Pain Onset Today             TREATMENT:  Prior to transfersand in between transfers: Patient sittingin wheelchair: -LLE only LAQx15 -seated forward position with elbows on knees 5 sec hold x5 reps; Advanced HEP with LE exercise/trunk position to  increase mobility at home, see patient instructions;   Ptrequires verbal and visual cues to improve ROM;He was able to exhibit improved LLE AROM with cues; Patient also requires cues for erect posture for better trunk control;  Following exercise: Transferred sit<>Stand in parallel bars (seat adjusted to27inch from floor height) x7repwith min A;herequiredmodVCs for forward weight shift for better trunk control and transfer ability; Patient able to exhibit better transfer ability with increased forward weight shift but then starts falling backwards requiring cues to increase forward trunk progression and hip extension;  Patient required min A for standing posture with cues for neutral weight shift. He he exhibits heavy posterior lean this session having difficulty increasing weight bearing in BLE.   Patient requiredmodA to scoot back in chair at end of session due to increased fatigue    Response to treatment: Patient reports increased fatigue in BUE during transfers this session. He admits he hasn't been doing as much exercise with UE. PT spoke with OT regarding increasing HEP for UE strengthening as patient relies heavily on UE for mobility. He was also instructed in advanced LE strengthening exercise, see pt instructions; Patient does fatigue quickly with standing activities this session. He missed 1 week of PT after having a left ankle injury. He has been cleared by MD to return to therapy with no restrictions but to monitor for swelling/redness. Pt would benefit from additional skilled PT Intervention to improve strength and mobility.                        PT Education - 09/22/19 1401    Education Details transfers/positioning, HEP    Person(s) Educated Patient;Child(ren)    Methods Explanation;Verbal cues;Handout    Comprehension Verbalized understanding;Returned demonstration;Verbal cues required;Need further instruction            PT  Short Term Goals - 08/18/19 1305      PT SHORT TERM GOAL #1   Title Be independent with initial home exercise program for self-management of symptoms.    Baseline HEP to be given at second session; advice to practice unsupported sitting at home (06/19/2018); 07/08/18: Performing at home, 6/29: needs assistance but is doing exercise program regularly; 08/22/18: adherent;    Time 2    Period Weeks    Status Achieved    Target Date 06/26/18             PT Long Term Goals - 08/20/19 1456      PT LONG TERM GOAL #1   Title Patient will complete all bed mobility with min A to improve functional independence for getting in and out of bed and adjusting in bed.     Baseline max A - total A  reported by family (06/19/2018); 07/08/18: still requires maxA, dtr reports improvement in rolling since starting therapy; 6/23: min A to supervision in clinic; not doing at home right; 8/5: Pt's daughter states that his rolling has improved, but still has difficulty with all other bed mobility; 10/23/18: pts daughter states that she is doing about 35% of the work if he is in proper position, he does uses the bed rails to help roll;  using Hoyer for coming to sit EOB; 10/12: pts daughter states that she is doing about 30% of the work if he is in proper position, he does uses the bed rails to help roll, but it is improving;  using Hoyer for coming to sit EOB, 10/29: min A for sit to sidelying, rolling supervision, able to transition sidelying to sit with HHA;    Time 12    Period Weeks    Status Achieved      PT LONG TERM GOAL #2   Title Patient will complete sit <> stand transfer chair to chair with Min A and LRAD to improve functional independence for household and community mobility.     Baseline supervision for STS, still requires elevated surface height to 27"    Time 12    Period Weeks    Status Achieved      PT LONG TERM GOAL #3   Title Patient will navigate power w/c with min A x 100 feet to improve mobility  for household and short community distances.    Baseline required total A (06/12/2018); able to roll 20 feet with min A (06/20/2018); 07/08/18: Pt can go approximately 10' without assist per family;  10/23/2018: pt's daughter states that his power WC mobility has become 100% better with the new hand control, slowed speed, and with practice; 10/12: pt's daughter reports that he can perform household WC mobility and limited community ambulation with minA    Time 12    Period Weeks    Status Achieved      PT LONG TERM GOAL #4   Title Patient will complete car transfers W/C to family SUV with min A using LRAD to improve his abilty to participate in community activities.     Baseline unable to complete car transfers at all (06/12/2018); unable to complete car transfers at all (06/19/2018).; 07/08/18: Unable to perform at this time; 09/11/18: unable to perform at this time; 10/23/2018: unable to complete car transfers at all; 10/12:  unable to complete car transfers at all, 10/29: unable; 01/13/19: unable; 2/11: min-mod A +2, 3/10: min A+1    Time 12    Period Weeks    Status Achieved      PT LONG TERM GOAL #5   Title Patient will ambulate at least 10 feet with min A using LRAD to improve mobility for household and community distances.    Baseline unable to take steps (06/12/2018); able to shift R leg forward and back with max A and RW at edge of plinth (06/19/2018); 07/08/18: unable to ambulate at this time. 09/11/18: unable to perform at this time; 10/23/2018: unable to perform at this time; 10/12: unable to perform at this time, 10/29: unable at this time; 01/13/19: unable at this time, 2/11: min A to step with RLE, unable to step with LLE, 3/10: able to take 2 steps in parallel bars with min A for safety; 4/1: min A for walking 10 feet in parallel bars, 5/24: min A walking 10 feet in parallel bars; 6/21: no change 7/14: no  change    Time 12    Period Weeks    Status Partially Met    Target Date 11/12/19      PT LONG TERM  GOAL #6   Title Patient will be able to safety navigate up/down ramp with power chair, exhibiting good safety awareness, mod I to safely enter/exit his home.    Baseline 2/11: supervision with intermittent min A;4/1: supervision; 5/24: supervision    Time 12    Period Weeks    Status Partially Met    Target Date 11/12/19      PT LONG TERM GOAL #7   Title Patient will increase functional reach test to >15 inches in sitting to exhibit improved sitting balance and positioning and reduce fall risk;    Baseline 07/30/18: 12 inches; 09/11/18: 10-12 inches; 10/23/18: 12-13 inch; 10/12: 14.5", 10/29: 15.5 inch    Time 4    Period Weeks    Status Achieved      PT LONG TERM GOAL #8   Title Patient will increase LLE quad strength to 3+/5 to improve functional strength for standing and mobility;    Baseline 10/28: 2+/5; 01/13/19: 2+/5, 2/11: 3-/5, 04/16/19: 3-/5, 05/08/19: 3-/5, 5/24: 3-/5, 6/21: 3-/5, 7/14: 3-/5    Time 12    Period Weeks    Status Partially Met    Target Date 11/12/19      PT LONG TERM GOAL  #9   TITLE Patient will report no vertigo with provoking motions or positions in order to be able to perform ADLs and mobility tasks without symptoms.    Baseline 06/30/19: reports no dizziness in last week; 6/21: one episode this weekend; 7/14: experiencing one a week;    Time 12    Period Weeks    Status Partially Met    Target Date 11/12/19                 Plan - 09/22/19 1401    Clinical Impression Statement Patient motivated and participated well within session. He was instructed in sit<>Stand transfers. He required increased cues for forward weight shift with heavy posterior lean. Patient required min A for transfers this session. It has been 10 days since he was last standing due to recent injury to ankle. He had increased difficulty finding neutral weight shift requiring min A in standing for proper standing position. patient instructed in advanced LE strengthening exercise as part  of HEP, see patient instructions. He verbalized understanding. spoke with OT regarding increasing UE strengthening to facilitate improved mobility at home as patient relies heavily on UE for transfers and other ADLs.    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    Examination-Activity Limitations Bed Mobility;Bend;Sit;Toileting;Stand;Stairs;Lift;Transfers;Squat;Locomotion Level;Carry;Dressing;Hygiene/Grooming;Continence    Examination-Participation Restrictions Yard Work;Interpersonal Relationship;Community Activity    Stability/Clinical Decision Making Evolving/Moderate complexity    Rehab Potential Fair    PT Frequency 3x / week    PT Duration 12 weeks    PT Treatment/Interventions ADLs/Self Care Home Management;Electrical Stimulation;Moist Heat;Cryotherapy;Gait training;Stair training;Functional mobility training;Therapeutic exercise;Balance training;Neuromuscular re-education;Cognitive remediation;Patient/family education;Orthotic Fit/Training;Wheelchair mobility training;Manual techniques;Passive range of motion;Energy conservation;Joint Manipulations;Canalith Repostioning;Therapeutic  activities;Vestibular    PT Next Visit Plan Work on strength, sitting/standing balance, functional activities such as rolling, bed mobility, transfers; caregiver training    PT Home Exercise Plan no updates this session    Consulted and Agree with Plan of Care Patient;Family  member/caregiver    Family Member Consulted Wife           Patient will benefit from skilled therapeutic intervention in order to improve the following deficits and impairments:  Abnormal gait, Decreased activity tolerance, Decreased cognition, Decreased endurance, Decreased knowledge of use of DME, Decreased range of motion, Decreased skin integrity, Decreased strength, Impaired perceived functional ability, Impaired sensation, Impaired UE functional use, Improper body mechanics, Pain, Cardiopulmonary status limiting activity, Decreased balance, Decreased coordination, Decreased mobility, Difficulty walking, Impaired tone, Postural dysfunction, Dizziness  Visit Diagnosis: Muscle weakness (generalized)  Other lack of coordination  Difficulty in walking, not elsewhere classified     Problem List Patient Active Problem List   Diagnosis Date Noted  . Sepsis ([place]) 10/18/2018    Nijae Doyel PT, DPT 09/22/2019, 2:04 PM  [place] MAIN [facility] SERVICES [address] [place], [place], [address] Phone: [phone]   Fax:  [phone]  Name: Kenneth Summers MRN: [id number] Date of Birth: [birth year]

## 2019-09-22 NOTE — Patient Instructions (Signed)
Access Code: [email or web address] URL: [email or web address] Date: 09/22/2019 Prepared by: Blanche East  Exercises Seated Long Arc Quad - 1 x daily - 7 x weekly - 2 sets - 10 reps Seated March - 1 x daily - 7 x weekly - 2 sets - 10 reps Seated Active Hip Flexion - 1 x daily - 7 x weekly - 1 sets - 5 reps - 10 sec hold Supine Ankle Pumps - 1 x daily - 7 x weekly - 1 sets - 10 reps

## 2019-09-24 ENCOUNTER — Encounter: Payer: Self-pay | Admitting: Physical Therapy

## 2019-09-24 ENCOUNTER — Ambulatory Visit: Payer: Medicare Other | Admitting: Physical Therapy

## 2019-09-24 ENCOUNTER — Other Ambulatory Visit: Payer: Self-pay

## 2019-09-24 DIAGNOSIS — R278 Other lack of coordination: Secondary | ICD-10-CM

## 2019-09-24 DIAGNOSIS — M6281 Muscle weakness (generalized): Secondary | ICD-10-CM | POA: Diagnosis not present

## 2019-09-24 DIAGNOSIS — R262 Difficulty in walking, not elsewhere classified: Secondary | ICD-10-CM

## 2019-09-24 NOTE — Therapy (Signed)
[place] MAIN [facility] SERVICES [address] [place], [place], [address] Phone: [phone]   Fax:  [phone]  Physical Therapy Treatment  Patient Details  Name: Kenneth Summers MRN: [id number] Date of Birth: 11-Oct-1945 No data recorded  Encounter Date: 09/24/2019   PT End of Session - 09/24/19 1322    Visit Number [id number]    Number of Visits 216    Date for PT Re-Evaluation 11/12/19    Authorization Type Medicare reporting period starting 09/11/18; NO FOTO    Authorization Time Period goals last assessed 08/20/19    PT Start Time 1302    PT Stop Time 1345    PT Time Calculation (min) 43 min    Equipment Utilized During Treatment Gait belt    Activity Tolerance Patient tolerated treatment well;No increased pain    Behavior During Therapy WFL for tasks assessed/performed           Past Medical History:  Diagnosis Date  . Actinic keratosis   . Alcohol abuse   . APS (antiphospholipid syndrome) ([place])   . Basal cell carcinoma   . BPH (benign prostatic hyperplasia)   . CAD (coronary artery disease)   . Cellulitis   . Cervical spinal stenosis   . Chronic osteomyelitis ([place])   . CKD (chronic kidney disease)   . CKD (chronic kidney disease)   . Clostridium difficile diarrhea   . Depression   . Diplopia   . Fatigue   . GERD (gastroesophageal reflux disease)   . Hyperlipemia   . Hypertension   . Hypovitaminosis D   . IBS (irritable bowel syndrome)   . 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 Insomnia   . Left lumbosacral radiculopathy   . Moderate obstructive sleep apnea   . Osteomyelitis of foot ([place])   . PAD (peripheral artery disease) ([place])   . Pruritus   . RA (rheumatoid arthritis) ([place])   . Radiculopathy   . Restless legs syndrome   . RLS (restless legs syndrome)   . SLE (systemic lupus erythematosus related syndrome) ([place])   . 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 Stroke ([place])   . Toxic maculopathy of  both eyes     Past Surgical History:  Procedure Laterality Date  . CARDIAC CATHETERIZATION    . CAROTID ENDARTERECTOMY    . CERVICAL LAMINECTOMY    . CORONARY ANGIOPLASTY    . FRACTURE SURGERY    . fusion C5-6-7    . HERNIA REPAIR    . KNEE ARTHROSCOPY    . TONSILLECTOMY      There were no vitals filed for this visit.   Subjective Assessment - 09/24/19 1320    Subjective Patient reports doing well. He reports no soreness today. He reports having episode of vertigo and was vomitting on Monday night. He hasn't had any episodes since.    Patient is accompained by: Family member    Pertinent History Patient is a 74 y.o. male who presents to outpatient physical therapy with a referral for medical diagnosis of CVA. This patient's chief complaints consist of left hemiplegia and overall deconditioning leading to the following functional deficits: dependent for ADLs, IADLs, unable to transfer to car, dependent transfers at home with hoyer lift, unable to walk or stand without significant assistance, difficulty with W/C navigation..    Limitations Sitting;Lifting;Standing;House hold activities;Writing;Walking;Other (comment)    Diagnostic tests Brain MRI 11/01/2017: IMPRESSION: 1. Acute right ACA territory infarct  as demonstrated on prior CT imaging. No intracranial hemorrhage or significant mass effect. 2. 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    Patient Stated Goals wants to be able to walk again    Currently in Pain? No/denies    Pain Onset More than a month ago    Pain Onset Today                TREATMENT:  Prior to transfersand in between transfers: Patient sittingin wheelchair: -LLE only LAQx15 -hip flexion march x15 reps each LE Required min VCs and visual cues for better AROM to increase strengthening;   Ptrequires verbal and visual cues to improve ROM;He was able to exhibit improved LLE AROM with cues; Patient also requires cues  for erect posture for better trunk control;  Following exercise: Transferred sit<>Stand in parallel bars (seat adjusted to27inch from floor height) X 6 repwith min A- CGA;herequiredmodVCs for forward weight shift for better trunk control and transfer ability; Patient able to exhibit better transfer ability with increased forward weight shift with less posterior loss of balance. However he does exhibit increased left lateral lean this session;   Patient required min A for standing posture with cues for neutral weight shift. He he exhibits heavy posterior lean this session having difficulty increasing weight bearing in BLE.   While standing: -mini squat 2x10 reps with min A for safety;   Patient requiredmodA to scoot back in chair at end of session due to increased fatigue    Response to treatment: Patient tolerated session well. He initially exhibited increased left lateral lean but was able to exhibit better trunk control throughout session. He was able to exhibit better neutral positioning in standing requiring less assistance in standing. Patient was also able to progress LE strengthening with increased repetition this session. He continues to fatigue requiring short seated rest breaks. Denies any pain at end of session;                     PT Education - 09/24/19 1321    Education Details transfers/positioning, HEP    Person(s) Educated Patient;Spouse    Methods Explanation;Verbal cues    Comprehension Verbalized understanding;Returned demonstration;Verbal cues required;Need further instruction            PT Short Term Goals - 08/18/19 1305      PT SHORT TERM GOAL #1   Title Be independent with initial home exercise program for self-management of symptoms.    Baseline HEP to be given at second session; advice to practice unsupported sitting at home (06/19/2018); 07/08/18: Performing at home, 6/29: needs assistance but is doing exercise program  regularly; 08/22/18: adherent;    Time 2    Period Weeks    Status Achieved    Target Date 06/26/18             PT Long Term Goals - 08/20/19 1456      PT LONG TERM GOAL #1   Title Patient will complete all bed mobility with min A to improve functional independence for getting in and out of bed and adjusting in bed.     Baseline max A - total A reported by family (06/19/2018); 07/08/18: still requires maxA, dtr reports improvement in rolling since starting therapy; 6/23: min A to supervision in clinic; not doing at home right; 8/5: Pt's daughter states that his rolling has improved, but still has difficulty with all other bed mobility; 10/23/18: pts daughter states that  she is doing about 35% of the work if he is in proper position, he does uses the bed rails to help roll;  using Harrel Lemon for coming to sit EOB; 10/12: pts daughter states that she is doing about 30% of the work if he is in proper position, he does uses the bed rails to help roll, but it is improving;  using Hoyer for coming to sit EOB, 10/29: min A for sit to sidelying, rolling supervision, able to transition sidelying to sit with HHA;    Time 12    Period Weeks    Status Achieved      PT LONG TERM GOAL #2   Title Patient will complete sit <> stand transfer chair to chair with Min A and LRAD to improve functional independence for household and community mobility.     Baseline supervision for STS, still requires elevated surface height to 27"    Time 12    Period Weeks    Status Achieved      PT LONG TERM GOAL #3   Title Patient will navigate power w/c with min A x 100 feet to improve mobility for household and short community distances.    Baseline required total A (06/12/2018); able to roll 20 feet with min A (06/20/2018); 07/08/18: Pt can go approximately 10' without assist per family;  10/23/2018: pt's daughter states that his power WC mobility has become 100% better with the new hand control, slowed speed, and with practice; 10/12:  pt's daughter reports that he can perform household WC mobility and limited community ambulation with minA    Time 12    Period Weeks    Status Achieved      PT LONG TERM GOAL #4   Title Patient will complete car transfers W/C to family SUV with min A using LRAD to improve his abilty to participate in community activities.     Baseline unable to complete car transfers at all (06/12/2018); unable to complete car transfers at all (06/19/2018).; 07/08/18: Unable to perform at this time; 09/11/18: unable to perform at this time; 10/23/2018: unable to complete car transfers at all; 10/12:  unable to complete car transfers at all, 10/29: unable; 01/13/19: unable; 2/11: min-mod A +2, 3/10: min A+1    Time 12    Period Weeks    Status Achieved      PT LONG TERM GOAL #5   Title Patient will ambulate at least 10 feet with min A using LRAD to improve mobility for household and community distances.    Baseline unable to take steps (06/12/2018); able to shift R leg forward and back with max A and RW at edge of plinth (06/19/2018); 07/08/18: unable to ambulate at this time. 09/11/18: unable to perform at this time; 10/23/2018: unable to perform at this time; 10/12: unable to perform at this time, 10/29: unable at this time; 01/13/19: unable at this time, 2/11: min A to step with RLE, unable to step with LLE, 3/10: able to take 2 steps in parallel bars with min A for safety; 4/1: min A for walking 10 feet in parallel bars, 5/24: min A walking 10 feet in parallel bars; 6/21: no change 7/14: no change    Time 12    Period Weeks    Status Partially Met    Target Date 11/12/19      PT LONG TERM GOAL #6   Title Patient will be able to safety navigate up/down ramp with power chair, exhibiting good safety  awareness, mod I to safely enter/exit his home.    Baseline 2/11: supervision with intermittent min A;4/1: supervision; 5/24: supervision    Time 12    Period Weeks    Status Partially Met    Target Date 11/12/19      PT LONG  TERM GOAL #7   Title Patient will increase functional reach test to >15 inches in sitting to exhibit improved sitting balance and positioning and reduce fall risk;    Baseline 07/30/18: 12 inches; 09/11/18: 10-12 inches; 10/23/18: 12-13 inch; 10/12: 14.5", 10/29: 15.5 inch    Time 4    Period Weeks    Status Achieved      PT LONG TERM GOAL #8   Title Patient will increase LLE quad strength to 3+/5 to improve functional strength for standing and mobility;    Baseline 10/28: 2+/5; 01/13/19: 2+/5, 2/11: 3-/5, 04/16/19: 3-/5, 05/08/19: 3-/5, 5/24: 3-/5, 6/21: 3-/5, 7/14: 3-/5    Time 12    Period Weeks    Status Partially Met    Target Date 11/12/19      PT LONG TERM GOAL  #9   TITLE Patient will report no vertigo with provoking motions or positions in order to be able to perform ADLs and mobility tasks without symptoms.    Baseline 06/30/19: reports no dizziness in last week; 6/21: one episode this weekend; 7/14: experiencing one a week;    Time 12    Period Weeks    Status Partially Met    Target Date 11/12/19                 Plan - 09/24/19 1411    Clinical Impression Statement Patient motivated and participated well within session. he was instructed in advanced transfers this session focusing on sit<>Stand in parallel bars. Initially patient exhibits increased left lateral lean but was able to progress with improved trunk control during session. He was able to exhibit better weight shift in standing with less posterior lean requiring less assistance in standing. Patient also able to progress LE strengthening with increased repetitions. He would benefit from additional skilled PT Intervention to improve strength balance and mobility;    Personal Factors and Comorbidities Age;Comorbidity 3+    Comorbidities Relevant past medical history and comorbidities include long term steroid use for lupus, CKD, chronic osteomyelitis, cervical spine stenosis, BPH, APS, alcohol abuse, peripheral artery  disease, depression, GERD, obstructive sleep apnea, HTN, IBS, IgA deficiency, lumbar radiculopathy, RA, Stroke, toxic maculopathy in both eyes, systemic lupus erythematosus related syndrome, cardiac catheterization, carotid endarterectomy, cervical laminectomy, coronary angioplasty, Fusion C5-C7, knee arthroplasty, lumbar surgery.    Examination-Activity Limitations Bed Mobility;Bend;Sit;Toileting;Stand;Stairs;Lift;Transfers;Squat;Locomotion Level;Carry;Dressing;Hygiene/Grooming;Continence    Examination-Participation Restrictions Yard Work;Interpersonal Relationship;Community Activity    Stability/Clinical Decision Making Evolving/Moderate complexity    Rehab Potential Fair    PT Frequency 3x / week    PT Duration 12 weeks    PT Treatment/Interventions ADLs/Self Care Home Management;Electrical Stimulation;Moist Heat;Cryotherapy;Gait training;Stair training;Functional mobility training;Therapeutic exercise;Balance training;Neuromuscular re-education;Cognitive remediation;Patient/family education;Orthotic Fit/Training;Wheelchair mobility training;Manual techniques;Passive range of motion;Energy conservation;Joint Manipulations;Canalith Repostioning;Therapeutic activities;Vestibular    PT Next Visit Plan Work on strength, sitting/standing balance, functional activities such as rolling, bed mobility, transfers; caregiver training    PT Home Exercise Plan no updates this session    Consulted and Agree with Plan of Care Patient;Family member/caregiver    Family Member Consulted Wife           Patient will benefit from skilled therapeutic intervention in order to improve the following  deficits and impairments:  Abnormal gait, Decreased activity tolerance, Decreased cognition, Decreased endurance, Decreased knowledge of use of DME, Decreased range of motion, Decreased skin integrity, Decreased strength, Impaired perceived functional ability, Impaired sensation, Impaired UE functional use, Improper body  mechanics, Pain, Cardiopulmonary status limiting activity, Decreased balance, Decreased coordination, Decreased mobility, Difficulty walking, Impaired tone, Postural dysfunction, Dizziness  Visit Diagnosis: Muscle weakness (generalized)  Other lack of coordination  Difficulty in walking, not elsewhere classified     Problem List Patient Active Problem List   Diagnosis Date Noted  . Sepsis ([place]) 10/18/2018    Rayme Bui PT, DPT 09/24/2019, 2:17 PM  [place] MAIN [facility] SERVICES [address] [place], [place], [address] Phone: [phone]   Fax:  [phone]  Name: DYER KLUG MRN: [id number] Date of Birth: Oct 30, 1945

## 2019-09-25 ENCOUNTER — Encounter: Payer: Self-pay | Admitting: Occupational Therapy

## 2019-09-25 ENCOUNTER — Encounter: Payer: Self-pay | Admitting: Physical Therapy

## 2019-09-25 ENCOUNTER — Other Ambulatory Visit: Payer: Self-pay

## 2019-09-25 ENCOUNTER — Ambulatory Visit: Payer: Medicare Other | Admitting: Physical Therapy

## 2019-09-25 ENCOUNTER — Ambulatory Visit: Payer: Medicare Other | Admitting: Occupational Therapy

## 2019-09-25 DIAGNOSIS — R278 Other lack of coordination: Secondary | ICD-10-CM

## 2019-09-25 DIAGNOSIS — R262 Difficulty in walking, not elsewhere classified: Secondary | ICD-10-CM

## 2019-09-25 DIAGNOSIS — M6281 Muscle weakness (generalized): Secondary | ICD-10-CM | POA: Diagnosis not present

## 2019-09-25 NOTE — Therapy (Signed)
[place] MAIN [facility] SERVICES [address] [place], [place], [address] Phone: [phone]   Fax:  [phone]  Occupational Therapy Treatment  Patient Details  Name: Kenneth Summers MRN: [id number] Date of Birth: 08-08-1945 No data recorded  Encounter Date: 09/25/2019   OT End of Session - 09/25/19 1639    Visit Number 133    Number of Visits 157    Date for OT Re-Evaluation 12/15/19    Authorization Type Progress report period starting 07/21/2019    OT Start Time 1300    OT Stop Time 1345    OT Time Calculation (min) 45 min    Activity Tolerance Patient tolerated treatment well    Behavior During Therapy [facility] for tasks assessed/performed           Past Medical History:  Diagnosis Date  . Actinic keratosis   . Alcohol abuse   . APS (antiphospholipid syndrome) ([place])   . Basal cell carcinoma   . BPH (benign prostatic hyperplasia)   . CAD (coronary artery disease)   . Cellulitis   . Cervical spinal stenosis   . Chronic osteomyelitis ([place])   . CKD (chronic kidney disease)   . CKD (chronic kidney disease)   . Clostridium difficile diarrhea   . Depression   . Diplopia   . Fatigue   . GERD (gastroesophageal reflux disease)   . Hyperlipemia   . Hypertension   . Hypovitaminosis D   . IBS (irritable bowel syndrome)   . 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 Insomnia   . Left lumbosacral radiculopathy   . Moderate obstructive sleep apnea   . Osteomyelitis of foot ([place])   . PAD (peripheral artery disease) ([place])   . Pruritus   . RA (rheumatoid arthritis) ([place])   . Radiculopathy   . Restless legs syndrome   . RLS (restless legs syndrome)   . SLE (systemic lupus erythematosus related syndrome) ([place])   . 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 Stroke ([place])   . Toxic maculopathy of both eyes     Past Surgical History:  Procedure Laterality Date  . CARDIAC CATHETERIZATION    . CAROTID ENDARTERECTOMY    .  CERVICAL LAMINECTOMY    . CORONARY ANGIOPLASTY    . FRACTURE SURGERY    . fusion C5-6-7    . HERNIA REPAIR    . KNEE ARTHROSCOPY    . TONSILLECTOMY      There were no vitals filed for this visit.   Subjective Assessment - 09/25/19 1637    Subjective  Pt. reports that he was unable to get back into the house after therapy yesterday. They needed to call the fire department to carry him in.    Patient is accompanied by: Family member    Pertinent History Pt. is a 74 y.o. 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 Pt. PMHx includes: Multiple Falls, Lupus, DJD, Renal Abscess, CVA. Pt. resides with his wife. Pt.'s wife and daughter assist with ADLs. Pt. has caregivers in  for 2 hours a day, 6 days a week. Pt. received Rehab services in acute care, at SNF for STR, and [place] services. Pt. is retired from [employer] for [employer] and [employer].    Patient Stated Goals To regain use of his left UE, and do more for himself.    Currently in Pain? No/denies  OT TREATMENT    Therapeutic Exercise:  Pt. Performed BUE therapeutic ex with yellow theraband. Pt. Performed bilateral shoulder flexion, abduction, horizontal abduction, diagonal abduction, elbow flexion, and extension. Pt. Performed 2# dumbbell ex. for elbow flexion and extension, forearm supination/pronation, wrist flexion/extension, and radial deviation. Pt. requires rest breaks and verbal cues for proper technique.  Pt. reports that he was unable to enter him home yesterday after therapy because the wheels on his chair became locked. The fire department came, and carried him, and his chair through the doorway. Pt. tolerated BUE exercises well, with no reports discomfort. Pt. required consistent verbal, visual, and tactile cues to perform UE strengthening exercises. Pt. continues to work on improving BUE strength, and [facility] skills in order to improve LUE functioning during ADLs, IADLs, and  tranfers.                           OT Education - 09/25/19 1639    Education Details BUE ther. ex.    Person(s) Educated Patient    Methods Explanation;Demonstration    Comprehension Returned demonstration;Tactile cues required;Verbalized understanding;Need further instruction               OT Long Term Goals - 09/22/19 1400      OT LONG TERM GOAL #1   Title Pt. will increase left shoulder flexion AROM by 10 degrees to access cabinet/shelf.    Baseline Pt. continues to progress with shoulder ROM. Marland KitchenShouler flexion 130, and continues to work on reaching with increasing emphasis on flexing trunk to improve functional reach.  07/21/19- shoulder flexion to 130 with effort    Time 12    Period Weeks    Status Partially Met    Target Date 12/15/19      OT LONG TERM GOAL #2   Title Pt. will donn a shirt with Supervision.    Baseline Pt. continues to require MinA donning a zip down jacket with consistent cues to initiate the correct direction of the jacket.  07/21/19 continues to require min assist at times for shirt/jacket    Time 12    Period Weeks    Status On-going    Target Date 12/15/19      OT LONG TERM GOAL #3   Title Pt. will require ModA to perform LE dressing    Baseline MaxA left, ModA with the right. Pt. has sores on his ankle, and feet. Pt.'s daughter is assisting.    Time 12    Period Weeks    Status Deferred      OT LONG TERM GOAL #4   Title Pt. will improve left grip strength by 10# to be able to open a jar/container.    Baseline Pt. conitnues to present with limited left hand grip strength. Pt. continues to work towards opening containers, and bottles.    Time 12    Period Weeks    Status On-going    Target Date 12/15/19      OT LONG TERM GOAL #5   Title Pt. will improve left hand [facility] skills to be able to assist with buttoning/zipping.    Baseline Pt. is improving with manipulating the zipper on his jacket, however continues to require  practice. Pt. continues to work on improving bilateral [facility] skills for buttoning/zipping.  07/21/19 improving with buttons and zippers.    Time 12    Period Weeks    Status On-going    Target Date 12/15/19  OT LONG TERM GOAL #6   Title Pt. will demonstrate visual conmpensatory strategies for 100% of the time during ADLs    Baseline Pt.continues to rerequire intermitten cues cues for left sides awareness.    Time 12    Period Weeks    Status Partially Met    Target Date 12/15/19      OT LONG TERM GOAL #7   Title Pt. will prepare a simple cold snack from w/c with supervision using cognitive compensatory strategies 100% of the time.    Baseline Pt. has difficulty with getting the w/c close enough to access the refrigerator    Time 12    Period Weeks    Status Deferred      OT LONG TERM GOAL #8   Title Pt. will accurately identify potential safety hazard using good safety awareness, and judgement 100% for ADLs, and IADLs.    Baseline Pt. requires cues    Time 12    Period Weeks    Status On-going    Target Date 12/15/19      OT LONG TERM GOAL  #9   TITLE Pt. will  navigate his w/c around obstacles with Supervision and 100% accuracy    Baseline Pt. continues to require cuing for obstacles on the left, however requires less cues overall. Pt. requires significant cues to engage his left UE during tasks. 07/21/2019 continues to improve with decreased assist at times    Time 12    Period Weeks    Status On-going    Target Date 12/15/19      OT LONG TERM GOAL  #10   TITLE Pt. will independently be able  to reach to place items into cabinetry, and closets.    Baseline Pt. BUE strength, and reaching  is limited.    Time 12    Period Weeks    Status New    Target Date 12/15/19      OT LONG TERM GOAL  #11   TITLE Pt. will increase BUE strength by 70m grades in preparation for ADL transfers.    Baseline Pt. continues to work towards improving overall strength,    Time 12    Period  Weeks    Status On-going    Target Date 12/15/19      OT LONG TERM GOAL  #12   TITLE Pt. will improve LUE functional reaching with the left engaging the trunk 100% of the time during ADL tasks with minimal cues.    Baseline 07/21/2019: Pt. is improving with LUE functional reaching. Pt. conitnues to require cues to engage his trunk for extended reaching.    Time 12    Period Weeks    Status On-going    Target Date 12/15/19                 Plan - 09/25/19 1640    Clinical Impression Statement Pt. reports that he was unable to enter him home yesterday after therapy because the wheels on his chair became locked. The fire department came, and carried him, and his chair through the doorway. Pt. tolerated BUE exercises well, with no reports discomfort. Pt. required consistent verbal, visual, and tactile cues to perform UE strengthening exercises. Pt. continues to work on improving BUE strength, and [facility]ills in order to improve LUE functioning during ADLs, IADLs, and tranfers.    OT Occupational Profile and History Problem Focused Assessment - Including review of records relating to presenting problem    Occupational performance  deficits (Please refer to evaluation for details): ADL's    Body Structure / Function / Physical Skills UE functional use;Coordination;FMC;Dexterity;Strength;ROM    Cognitive Skills Attention;Memory;Emotional;Problem Solve;Safety Awareness    Rehab Potential Good    Clinical Decision Making Several treatment options, min-mod task modification necessary    Comorbidities Affecting Occupational Performance: Presence of comorbidities impacting occupational performance    Comorbidities impacting occupational performance description: Phyical, cognitive, visual,  medical comorbidities    Modification or Assistance to Complete Evaluation  No modification of tasks or assist necessary to complete eval    OT Frequency 2x / week    OT Duration 12 weeks    OT  Treatment/Interventions Self-care/ADL training;DME and/or AE instruction;Therapeutic exercise;Therapeutic activities;Moist Heat;Cognitive remediation/compensation;Neuromuscular education;Visual/perceptual remediation/compensation;Coping strategies training;Patient/family education;Passive range of motion;Psychosocial skills training;Energy conservation;Functional Mobility Training    Consulted and Agree with Plan of Care Patient    Family Member Consulted Wife           Patient will benefit from skilled therapeutic intervention in order to improve the following deficits and impairments:   Body Structure / Function / Physical Skills: UE functional use, Coordination, FMC, Dexterity, Strength, ROM Cognitive Skills: Attention, Memory, Emotional, Problem Solve, Safety Awareness     Visit Diagnosis: Muscle weakness (generalized)  Other lack of coordination    Problem List Patient Active Problem List   Diagnosis Date Noted  . Sepsis ([place]) 10/18/2018    Harrel Carina, MS, OTR/L 09/25/2019, 4:41 PM  [place] MAIN [facility] SERVICES [address] [place], [place], [address] Phone: [phone]   Fax:  [phone]  Name: DEMETRIE BORGE MRN: [id number] Date of Birth: 10/01/1945

## 2019-09-25 NOTE — Therapy (Signed)
[place] MAIN [facility] SERVICES [address] [place], [place], [address] Phone: [phone]   Fax:  [phone]  Physical Therapy Treatment  Patient Details  Name: Kenneth Summers MRN: [id number] Date of Birth: 1945/09/11 No data recorded  Encounter Date: 09/25/2019   PT End of Session - 09/25/19 1403    Visit Number [id number]    Number of Visits 216    Date for PT Re-Evaluation 11/12/19    Authorization Type Medicare reporting period starting 09/11/18; NO FOTO    Authorization Time Period goals last assessed 08/20/19    PT Start Time 1345    PT Stop Time 1430    PT Time Calculation (min) 45 min    Equipment Utilized During Treatment Gait belt    Activity Tolerance Patient tolerated treatment well;No increased pain    Behavior During Therapy WFL for tasks assessed/performed           Past Medical History:  Diagnosis Date  . Actinic keratosis   . Alcohol abuse   . APS (antiphospholipid syndrome) ([place])   . Basal cell carcinoma   . BPH (benign prostatic hyperplasia)   . CAD (coronary artery disease)   . Cellulitis   . Cervical spinal stenosis   . Chronic osteomyelitis ([place])   . CKD (chronic kidney disease)   . CKD (chronic kidney disease)   . Clostridium difficile diarrhea   . Depression   . Diplopia   . Fatigue   . GERD (gastroesophageal reflux disease)   . Hyperlipemia   . Hypertension   . Hypovitaminosis D   . IBS (irritable bowel syndrome)   . 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 Insomnia   . Left lumbosacral radiculopathy   . Moderate obstructive sleep apnea   . Osteomyelitis of foot ([place])   . PAD (peripheral artery disease) ([place])   . Pruritus   . RA (rheumatoid arthritis) ([place])   . Radiculopathy   . Restless legs syndrome   . RLS (restless legs syndrome)   . SLE (systemic lupus erythematosus related syndrome) ([place])   . 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 Stroke ([place])   . Toxic maculopathy of  both eyes     Past Surgical History:  Procedure Laterality Date  . CARDIAC CATHETERIZATION    . CAROTID ENDARTERECTOMY    . CERVICAL LAMINECTOMY    . CORONARY ANGIOPLASTY    . FRACTURE SURGERY    . fusion C5-6-7    . HERNIA REPAIR    . KNEE ARTHROSCOPY    . TONSILLECTOMY      There were no vitals filed for this visit.   Subjective Assessment - 09/25/19 1402    Subjective Patient reports doing well; Presents to therapy with shoes on with minimal swelling in LLE ankle. Denies any dizziness and no pain today;    Patient is accompained by: Family member    Pertinent History Patient is a 74 y.o. male who presents to outpatient physical therapy with a referral for medical diagnosis of CVA. This patient's chief complaints consist of left hemiplegia and overall deconditioning leading to the following functional deficits: dependent for ADLs, IADLs, unable to transfer to car, dependent transfers at home with hoyer lift, unable to walk or stand without significant assistance, difficulty with W/C navigation..    Limitations Sitting;Lifting;Standing;House hold activities;Writing;Walking;Other (comment)    Diagnostic tests Brain MRI 11/01/2017: IMPRESSION: 1. Acute right ACA territory infarct as demonstrated on prior  CT imaging. No intracranial hemorrhage or significant mass effect. 2. 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    Patient Stated Goals wants to be able to walk again    Currently in Pain? No/denies    Pain Onset More than a month ago    Multiple Pain Sites No    Pain Onset Today               TREATMENT: Prior to transfersand in between transfers: Patient sittingin wheelchair: -LLE only LAQx15 -hip flexion march x15 reps each LE Required min VCs and visual cues for better AROM to increase strengthening;  Ptrequires verbal and visual cues to improve ROM;He was able to exhibit improved LLE AROM with cues; Patient also requires cues  for erect posture for better trunk control;  Following exercise: Transferred sit<>Stand in parallel bars (seat adjusted to27inch from floor height) X 4 repwith min A- CGA;herequiredmodVCs for forward weight shift for better trunk control and transfer ability; Patient able to exhibit better transfer ability with increased forward weight shift with less posterior loss of balance. However he does exhibit increased left lateral lean this session;   Patient required min A to CGA for standing posture. He was able to exhibit less posterior lean but does require cues for increased hip/knee extension for better weight bearing in BLE in standing;   While standing: -mini squat x10 reps with min A for safety;  - side/side weight shift x10 reps with min A and cues for erect posture including increased knee extension;  -Toe taps on 4 inch step, x5 reps RLE, x1 reps on LLE with mod VCs for increased hip extension and knee extension; min A for safety; Patient able to exhibit better hip extension and increased weight bearing in BLE this session compared to previous session;   Patient requiredmodA to scoot back in chair at end of session due to increased fatigue    Response to treatment: Patient tolerated session well.He was able to tolerate longer period of standing this session with less rest breaks. He was also able to exhibit better weight shift with increased hip extension with less posterior lean.  Patient does require increased fatigue at end of session but denies any increase in pain; Reinforced HEP;                         PT Education - 09/25/19 1403    Education Details transfers/positioning, LE strengthening; HEP    Person(s) Educated Patient;Child(ren)    Methods Explanation;Verbal cues    Comprehension Verbalized understanding;Returned demonstration;Verbal cues required;Need further instruction            PT Short Term Goals - 08/18/19 1305      PT  SHORT TERM GOAL #1   Title Be independent with initial home exercise program for self-management of symptoms.    Baseline HEP to be given at second session; advice to practice unsupported sitting at home (06/19/2018); 07/08/18: Performing at home, 6/29: needs assistance but is doing exercise program regularly; 08/22/18: adherent;    Time 2    Period Weeks    Status Achieved    Target Date 06/26/18             PT Long Term Goals - 08/20/19 1456      PT LONG TERM GOAL #1   Title Patient will complete all bed mobility with min A to improve functional independence for getting in and out of bed  and adjusting in bed.     Baseline max A - total A reported by family (06/19/2018); 07/08/18: still requires maxA, dtr reports improvement in rolling since starting therapy; 6/23: min A to supervision in clinic; not doing at home right; 8/5: Pt's daughter states that his rolling has improved, but still has difficulty with all other bed mobility; 10/23/18: pts daughter states that she is doing about 35% of the work if he is in proper position, he does uses the bed rails to help roll;  using Hoyer for coming to sit EOB; 10/12: pts daughter states that she is doing about 30% of the work if he is in proper position, he does uses the bed rails to help roll, but it is improving;  using Hoyer for coming to sit EOB, 10/29: min A for sit to sidelying, rolling supervision, able to transition sidelying to sit with HHA;    Time 12    Period Weeks    Status Achieved      PT LONG TERM GOAL #2   Title Patient will complete sit <> stand transfer chair to chair with Min A and LRAD to improve functional independence for household and community mobility.     Baseline supervision for STS, still requires elevated surface height to 27"    Time 12    Period Weeks    Status Achieved      PT LONG TERM GOAL #3   Title Patient will navigate power w/c with min A x 100 feet to improve mobility for household and short community distances.     Baseline required total A (06/12/2018); able to roll 20 feet with min A (06/20/2018); 07/08/18: Pt can go approximately 10' without assist per family;  10/23/2018: pt's daughter states that his power WC mobility has become 100% better with the new hand control, slowed speed, and with practice; 10/12: pt's daughter reports that he can perform household WC mobility and limited community ambulation with minA    Time 12    Period Weeks    Status Achieved      PT LONG TERM GOAL #4   Title Patient will complete car transfers W/C to family SUV with min A using LRAD to improve his abilty to participate in community activities.     Baseline unable to complete car transfers at all (06/12/2018); unable to complete car transfers at all (06/19/2018).; 07/08/18: Unable to perform at this time; 09/11/18: unable to perform at this time; 10/23/2018: unable to complete car transfers at all; 10/12:  unable to complete car transfers at all, 10/29: unable; 01/13/19: unable; 2/11: min-mod A +2, 3/10: min A+1    Time 12    Period Weeks    Status Achieved      PT LONG TERM GOAL #5   Title Patient will ambulate at least 10 feet with min A using LRAD to improve mobility for household and community distances.    Baseline unable to take steps (06/12/2018); able to shift R leg forward and back with max A and RW at edge of plinth (06/19/2018); 07/08/18: unable to ambulate at this time. 09/11/18: unable to perform at this time; 10/23/2018: unable to perform at this time; 10/12: unable to perform at this time, 10/29: unable at this time; 01/13/19: unable at this time, 2/11: min A to step with RLE, unable to step with LLE, 3/10: able to take 2 steps in parallel bars with min A for safety; 4/1: min A for walking 10 feet in parallel bars,  5/24: min A walking 10 feet in parallel bars; 6/21: no change 7/14: no change    Time 12    Period Weeks    Status Partially Met    Target Date 11/12/19      PT LONG TERM GOAL #6   Title Patient will be able to  safety navigate up/down ramp with power chair, exhibiting good safety awareness, mod I to safely enter/exit his home.    Baseline 2/11: supervision with intermittent min A;4/1: supervision; 5/24: supervision    Time 12    Period Weeks    Status Partially Met    Target Date 11/12/19      PT LONG TERM GOAL #7   Title Patient will increase functional reach test to >15 inches in sitting to exhibit improved sitting balance and positioning and reduce fall risk;    Baseline 07/30/18: 12 inches; 09/11/18: 10-12 inches; 10/23/18: 12-13 inch; 10/12: 14.5", 10/29: 15.5 inch    Time 4    Period Weeks    Status Achieved      PT LONG TERM GOAL #8   Title Patient will increase LLE quad strength to 3+/5 to improve functional strength for standing and mobility;    Baseline 10/28: 2+/5; 01/13/19: 2+/5, 2/11: 3-/5, 04/16/19: 3-/5, 05/08/19: 3-/5, 5/24: 3-/5, 6/21: 3-/5, 7/14: 3-/5    Time 12    Period Weeks    Status Partially Met    Target Date 11/12/19      PT LONG TERM GOAL  #9   TITLE Patient will report no vertigo with provoking motions or positions in order to be able to perform ADLs and mobility tasks without symptoms.    Baseline 06/30/19: reports no dizziness in last week; 6/21: one episode this weekend; 7/14: experiencing one a week;    Time 12    Period Weeks    Status Partially Met    Target Date 11/12/19                 Plan - 09/26/19 2620    Clinical Impression Statement Patient motivated and participated well within session. He was able to exhibit better weight shift in standing this session with increased weight bearing in BLE and improved hip extension. He was also able to stand for longer period of time compared to recent sessions with less fatigue. Patient does exhibit decreased LLE AROM this session however this is likely due to patient wearing shoes which he hasn't been able to do for last few weeks due to foot swelling. He does report increased fatigue at end of session. Patient  would benefit from additional skilled PT Intervention to improve strength, balance and mobility;    Personal Factors and Comorbidities Age;Comorbidity 3+    Comorbidities Relevant past medical history and comorbidities include long term steroid use for lupus, CKD, chronic osteomyelitis, cervical spine stenosis, BPH, APS, alcohol abuse, peripheral artery disease, depression, GERD, obstructive sleep apnea, HTN, IBS, IgA deficiency, lumbar radiculopathy, RA, Stroke, toxic maculopathy in both eyes, systemic lupus erythematosus related syndrome, cardiac catheterization, carotid endarterectomy, cervical laminectomy, coronary angioplasty, Fusion C5-C7, knee arthroplasty, lumbar surgery.    Examination-Activity Limitations Bed Mobility;Bend;Sit;Toileting;Stand;Stairs;Lift;Transfers;Squat;Locomotion Level;Carry;Dressing;Hygiene/Grooming;Continence    Examination-Participation Restrictions Yard Work;Interpersonal Relationship;Community Activity    Stability/Clinical Decision Making Evolving/Moderate complexity    Rehab Potential Fair    PT Frequency 3x / week    PT Duration 12 weeks    PT Treatment/Interventions ADLs/Self Care Home Management;Electrical Stimulation;Moist Heat;Cryotherapy;Gait training;Stair training;Functional mobility training;Therapeutic exercise;Balance training;Neuromuscular re-education;Cognitive remediation;Patient/family education;Orthotic Fit/Training;Wheelchair  mobility training;Manual techniques;Passive range of motion;Energy conservation;Joint Manipulations;Canalith Repostioning;Therapeutic activities;Vestibular    PT Next Visit Plan Work on strength, sitting/standing balance, functional activities such as rolling, bed mobility, transfers; caregiver training    PT Home Exercise Plan no updates this session    Consulted and Agree with Plan of Care Patient;Family member/caregiver    Family Member Consulted Wife           Patient will benefit from skilled therapeutic intervention in  order to improve the following deficits and impairments:  Abnormal gait, Decreased activity tolerance, Decreased cognition, Decreased endurance, Decreased knowledge of use of DME, Decreased range of motion, Decreased skin integrity, Decreased strength, Impaired perceived functional ability, Impaired sensation, Impaired UE functional use, Improper body mechanics, Pain, Cardiopulmonary status limiting activity, Decreased balance, Decreased coordination, Decreased mobility, Difficulty walking, Impaired tone, Postural dysfunction, Dizziness  Visit Diagnosis: Muscle weakness (generalized)  Other lack of coordination  Difficulty in walking, not elsewhere classified     Problem List Patient Active Problem List   Diagnosis Date Noted  . Sepsis ([place]) 10/18/2018    Gerilyn Stargell PT, DPT 09/26/2019, 8:24 AM  [place] MAIN [facility] SERVICES [address] [place], [place], [address] Phone: [phone]   Fax:  [phone]  Name: Kenneth Summers MRN: [id number] Date of Birth: October 23, 1945

## 2019-09-29 ENCOUNTER — Encounter: Payer: Self-pay | Admitting: Physical Therapy

## 2019-09-29 ENCOUNTER — Ambulatory Visit: Payer: Medicare Other | Admitting: Occupational Therapy

## 2019-09-29 ENCOUNTER — Ambulatory Visit: Payer: Medicare Other | Admitting: Physical Therapy

## 2019-09-29 ENCOUNTER — Other Ambulatory Visit: Payer: Self-pay

## 2019-09-29 ENCOUNTER — Encounter: Payer: Self-pay | Admitting: Occupational Therapy

## 2019-09-29 DIAGNOSIS — M6281 Muscle weakness (generalized): Secondary | ICD-10-CM

## 2019-09-29 DIAGNOSIS — R262 Difficulty in walking, not elsewhere classified: Secondary | ICD-10-CM

## 2019-09-29 DIAGNOSIS — R278 Other lack of coordination: Secondary | ICD-10-CM

## 2019-09-29 NOTE — Therapy (Signed)
[place] MAIN [facility] SERVICES [address] [place], [place], [address] Phone: [phone]   Fax:  [phone]  Occupational Therapy Treatment  Patient Details  Name: Kenneth Summers MRN: [id number] Date of Birth: April 12, 1945 No data recorded  Encounter Date: 09/29/2019   OT End of Session - 09/29/19 1714    Visit Number 134    Number of Visits 157    Date for OT Re-Evaluation 12/15/19    Authorization Type Progress report period starting 07/21/2019    OT Start Time 1347    OT Stop Time 1431    OT Time Calculation (min) 44 min    Activity Tolerance Patient tolerated treatment well    Behavior During Therapy [facility] for tasks assessed/performed           Past Medical History:  Diagnosis Date  . Actinic keratosis   . Alcohol abuse   . APS (antiphospholipid syndrome) ([place])   . Basal cell carcinoma   . BPH (benign prostatic hyperplasia)   . CAD (coronary artery disease)   . Cellulitis   . Cervical spinal stenosis   . Chronic osteomyelitis ([place])   . CKD (chronic kidney disease)   . CKD (chronic kidney disease)   . Clostridium difficile diarrhea   . Depression   . Diplopia   . Fatigue   . GERD (gastroesophageal reflux disease)   . Hyperlipemia   . Hypertension   . Hypovitaminosis D   . IBS (irritable bowel syndrome)   . 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 Insomnia   . Left lumbosacral radiculopathy   . Moderate obstructive sleep apnea   . Osteomyelitis of foot ([employer])   . PAD (peripheral artery disease) ([place])   . Pruritus   . RA (rheumatoid arthritis) ([place])   . Radiculopathy   . Restless legs syndrome   . RLS (restless legs syndrome)   . SLE (systemic lupus erythematosus related syndrome) ([place])   . 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 Stroke ([place])   . Toxic maculopathy of both eyes     Past Surgical History:  Procedure Laterality Date  . CARDIAC CATHETERIZATION    . CAROTID ENDARTERECTOMY    .  CERVICAL LAMINECTOMY    . CORONARY ANGIOPLASTY    . FRACTURE SURGERY    . fusion C5-6-7    . HERNIA REPAIR    . KNEE ARTHROSCOPY    . TONSILLECTOMY      There were no vitals filed for this visit.   Subjective Assessment - 09/29/19 1653    Subjective  Pt reports feeling well today and eager to participate.    Patient is accompanied by: Family member    Pertinent History Pt. is a 74 y.o. 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 Pt. PMHx includes: Multiple Falls, Lupus, DJD, Renal Abscess, CVA. Pt. resides with his wife. Pt.'s wife and daughter assist with ADLs. Pt. has caregivers in  for 2 hours a day, 6 days a week. Pt. received Rehab services in acute care, at SNF for STR, and [place] services. Pt. is retired from [employer] for [employer] and [employer].    Patient Stated Goals To regain use of his left UE, and do more for himself.    Currently in Pain? Yes    Pain Score 3     Pain Location Shoulder    Pain Orientation Left    Pain  Descriptors / Indicators Aching    Pain Type Chronic pain    Pain Radiating Towards mild radiating from neck to L hand/wrist    Pain Frequency Intermittent    Aggravating Factors  unsure    Pain Relieving Factors medication/rest    Multiple Pain Sites No            OT Tx  There Ex: Pt performed strengthening exercises with 4# dowel rod including circumduction and bicep curls in pronation and supination of forearms 1 set of 10 reps per exercise with rest in between. VC and visual cues required for proper technique.   Neuro Re-ed Pt performed pinch/grasp tasks with small manipulates using lateral pinch to clip objects on small dowel. Pt required use of R hand to help position object in L hand and ~75% verbal cues for repositioning prior to attempts to improve technique during. Pt able to laterally pinch objects with varying resistance and only dropped 1/10. LUE noted to have slight tremor by the end of the task  2/2 fatigue.                     OT Education - 09/29/19 1712    Education Details FMC, BUE ex    Person(s) Educated Patient    Methods Explanation;Demonstration    Comprehension Returned demonstration;Tactile cues required;Verbalized understanding;Need further instruction               OT Long Term Goals - 09/22/19 1400      OT LONG TERM GOAL #1   Title Pt. will increase left shoulder flexion AROM by 10 degrees to access cabinet/shelf.    Baseline Pt. continues to progress with shoulder ROM. [place] Kitchen[place]ouler flexion 130, and continues to work on reaching with increasing emphasis on flexing trunk to improve functional reach.  07/21/19- shoulder flexion to 130 with effort    Time 12    Period Weeks    Status Partially Met    Target Date 12/15/19      OT LONG TERM GOAL #2   Title Pt. will donn a shirt with Supervision.    Baseline Pt. continues to require MinA donning a zip down jacket with consistent cues to initiate the correct direction of the jacket.  07/21/19 continues to require min assist at times for shirt/jacket    Time 12    Period Weeks    Status On-going    Target Date 12/15/19      OT LONG TERM GOAL #3   Title Pt. will require ModA to perform LE dressing    Baseline MaxA left, ModA with the right. Pt. has sores on his ankle, and feet. Pt.'s daughter is assisting.    Time 12    Period Weeks    Status Deferred      OT LONG TERM GOAL #4   Title Pt. will improve left grip strength by 10# to be able to open a jar/container.    Baseline Pt. conitnues to present with limited left hand grip strength. Pt. continues to work towards opening containers, and bottles.    Time 12    Period Weeks    Status On-going    Target Date 12/15/19      OT LONG TERM GOAL #5   Title Pt. will improve left hand [facility] skills to be able to assist with buttoning/zipping.    Baseline Pt. is improving with manipulating the zipper on his jacket, however continues to require practice.  Pt. continues to work on  improving bilateral [facility] skills for buttoning/zipping.  07/21/19 improving with buttons and zippers.    Time 12    Period Weeks    Status On-going    Target Date 12/15/19      OT LONG TERM GOAL #6   Title Pt. will demonstrate visual conmpensatory strategies for 100% of the time during ADLs    Baseline Pt.continues to rerequire intermitten cues cues for left sides awareness.    Time 12    Period Weeks    Status Partially Met    Target Date 12/15/19      OT LONG TERM GOAL #7   Title Pt. will prepare a simple cold snack from w/c with supervision using cognitive compensatory strategies 100% of the time.    Baseline Pt. has difficulty with getting the w/c close enough to access the refrigerator    Time 12    Period Weeks    Status Deferred      OT LONG TERM GOAL #8   Title Pt. will accurately identify potential safety hazard using good safety awareness, and judgement 100% for ADLs, and IADLs.    Baseline Pt. requires cues    Time 12    Period Weeks    Status On-going    Target Date 12/15/19      OT LONG TERM GOAL  #9   TITLE Pt. will  navigate his w/c around obstacles with Supervision and 100% accuracy    Baseline Pt. continues to require cuing for obstacles on the left, however requires less cues overall. Pt. requires significant cues to engage his left UE during tasks. 07/21/2019 continues to improve with decreased assist at times    Time 12    Period Weeks    Status On-going    Target Date 12/15/19      OT LONG TERM GOAL  #10   TITLE Pt. will independently be able  to reach to place items into cabinetry, and closets.    Baseline Pt. BUE strength, and reaching  is limited.    Time 12    Period Weeks    Status New    Target Date 12/15/19      OT LONG TERM GOAL  #11   TITLE Pt. will increase BUE strength by 15m grades in preparation for ADL transfers.    Baseline Pt. continues to work towards improving overall strength,    Time 12    Period Weeks     Status On-going    Target Date 12/15/19      OT LONG TERM GOAL  #12   TITLE Pt. will improve LUE functional reaching with the left engaging the trunk 100% of the time during ADL tasks with minimal cues.    Baseline 07/21/2019: Pt. is improving with LUE functional reaching. Pt. conitnues to require cues to engage his trunk for extended reaching.    Time 12    Period Weeks    Status On-going    Target Date 12/15/19                 Plan - 09/29/19 1715    Clinical Impression Statement Pt reports recent issues with chronic L shoulder/neck pain radiating and causing pain in L forearm/hand. 3/10 during session. Pt tolerated activities well, requiring verbal cues throughout to redirect to task and technique. Pt continues to benefit from skilled OT services to improve BUE strength, [place] and attention skills to maximize LUE functioning during ADL, IADL, and functional transfers.    OT Occupational  Profile and History Problem Focused Assessment - Including review of records relating to presenting problem    Occupational performance deficits (Please refer to evaluation for details): ADL's    Body Structure / Function / Physical Skills UE functional use;Coordination;FMC;Dexterity;Strength;ROM    Rehab Potential Good    Clinical Decision Making Several treatment options, min-mod task modification necessary    Comorbidities impacting occupational performance description: Phyical, cognitive, visual,  medical comorbidities    OT Frequency 2x / week    OT Duration 12 weeks    OT Treatment/Interventions Self-care/ADL training;DME and/or AE instruction;Therapeutic exercise;Therapeutic activities;Moist Heat;Cognitive remediation/compensation;Neuromuscular education;Visual/perceptual remediation/compensation;Coping strategies training;Patient/family education;Passive range of motion;Psychosocial skills training;Energy conservation;Functional Mobility Training    Consulted and Agree with Plan of Care  Patient;Family member/caregiver    Family Member Consulted daughter present           Patient will benefit from skilled therapeutic intervention in order to improve the following deficits and impairments:   Body Structure / Function / Physical Skills: UE functional use, Coordination, [place], Dexterity, Strength, ROM       Visit Diagnosis: Muscle weakness (generalized)  Other lack of coordination    Problem List Patient Active Problem List   Diagnosis Date Noted  . Sepsis ([place]) 10/18/2018    Corky Sox, OTR/L 09/29/2019, 5:21 PM  [place] MAIN [facility] SERVICES [address] [place], [place], [address] Phone: [phone]   Fax:  [phone]  Name: Kenneth Summers MRN: [id number] Date of Birth: [birth year]

## 2019-09-29 NOTE — Therapy (Signed)
[place] MAIN [facility] SERVICES [address] [place], [place], [address] Phone: [phone]   Fax:  [phone]  Physical Therapy Treatment  Patient Details  Name: Kenneth Summers MRN: [id number] Date of Birth: Oct 08, 1945 No data recorded  Encounter Date: 09/29/2019   PT End of Session - 09/29/19 1317    Visit Number [id number]    Number of Visits 216    Date for PT Re-Evaluation 11/12/19    Authorization Type Medicare reporting period starting 09/11/18; NO FOTO    Authorization Time Period goals last assessed 08/20/19    PT Start Time 1302    PT Stop Time 1345    PT Time Calculation (min) 43 min    Equipment Utilized During Treatment Gait belt    Activity Tolerance Patient tolerated treatment well;No increased pain    Behavior During Therapy WFL for tasks assessed/performed           Past Medical History:  Diagnosis Date  . Actinic keratosis   . Alcohol abuse   . APS (antiphospholipid syndrome) ([place])   . Basal cell carcinoma   . BPH (benign prostatic hyperplasia)   . CAD (coronary artery disease)   . Cellulitis   . Cervical spinal stenosis   . Chronic osteomyelitis ([place])   . CKD (chronic kidney disease)   . CKD (chronic kidney disease)   . Clostridium difficile diarrhea   . Depression   . Diplopia   . Fatigue   . GERD (gastroesophageal reflux disease)   . Hyperlipemia   . Hypertension   . Hypovitaminosis D   . IBS (irritable bowel syndrome)   . 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 Insomnia   . Left lumbosacral radiculopathy   . Moderate obstructive sleep apnea   . Osteomyelitis of foot ([place])   . PAD (peripheral artery disease) ([place])   . Pruritus   . RA (rheumatoid arthritis) ([place])   . Radiculopathy   . Restless legs syndrome   . RLS (restless legs syndrome)   . SLE (systemic lupus erythematosus related syndrome) ([place])   . 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 Stroke ([place])   . Toxic maculopathy of  both eyes     Past Surgical History:  Procedure Laterality Date  . CARDIAC CATHETERIZATION    . CAROTID ENDARTERECTOMY    . CERVICAL LAMINECTOMY    . CORONARY ANGIOPLASTY    . FRACTURE SURGERY    . fusion C5-6-7    . HERNIA REPAIR    . KNEE ARTHROSCOPY    . TONSILLECTOMY      There were no vitals filed for this visit.   Subjective Assessment - 09/29/19 1316    Subjective Patient reports doing well; He reports increased LUE pain since last night, likely related to cervical impingement;    Patient is accompained by: Family member    Pertinent History Patient is a 74 y.o. male who presents to outpatient physical therapy with a referral for medical diagnosis of CVA. This patient's chief complaints consist of left hemiplegia and overall deconditioning leading to the following functional deficits: dependent for ADLs, IADLs, unable to transfer to car, dependent transfers at home with hoyer lift, unable to walk or stand without significant assistance, difficulty with W/C navigation..    Limitations Sitting;Lifting;Standing;House hold activities;Writing;Walking;Other (comment)    Diagnostic tests Brain MRI 11/01/2017: IMPRESSION: 1. Acute right ACA territory infarct as demonstrated on prior CT imaging. No intracranial hemorrhage or  significant mass effect. 2. 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    Patient Stated Goals wants to be able to walk again    Currently in Pain? Yes    Pain Score 8     Pain Location Arm    Pain Orientation Left    Pain Descriptors / Indicators Throbbing    Pain Type Acute pain    Pain Onset Yesterday    Pain Frequency Constant    Aggravating Factors  unsure    Pain Relieving Factors medication/rest    Effect of Pain on Daily Activities decreased activity tolerance;    Multiple Pain Sites No    Pain Onset Today              TREATMENT: Prior to transfersand in between transfers: Patient sittingin  wheelchair: -LLE only LAQx15 -hip flexion march x10 reps each LE -arms in lap, resting UE, erect posture, head/trunk rotation x5 reps each LE;  Required min VCs and visual cues for better AROM to increase strengthening;  Ptrequires verbal and visual cues to improve ROM;He was able to exhibit improved LLE AROM with cues; Patient also requires cues for erect posture for better trunk control;  Following exercise: Transferred sit<>Stand in parallel bars (seat adjusted to27inch from floor height) X5 repwith CGA to close supervision;herequiredmodVCs for forward weight shift for better trunk control and transfer ability; Patient able to exhibit better transfer ability with increased forward weight shiftwith less posterior loss of balance. However he does exhibit increased left lateral lean this session;  Patient required min A to CGA for standing posture. He was able to exhibit less posterior lean but does require cues for increased hip/knee extension for better weight bearing in BLE in standing;   While standing: -mini squatx10 reps with min A for safety; - side/side weight shift x10 reps with min A and cues for erect posture including increased knee extension;  -unsupported standing 2 x10 sec with therapist min A for safety;  -sitting down to wheelchair and then standing back up, min A x5 reps to mimic sit<>Stand and bigger squat; Pt heavily short of breath and fatigued after exercise;  -Toe taps on 4 inch step, x5 reps RLE, x5 reps on LLE with mod VCs for increased hip extension and knee extension; min A for safety; Patient able to exhibit better hip extension and increased weight bearing in BLE this session compared to previous session;   Patient requiredmodA to scoot back in chair at end of session due to increased fatigue    Response to treatment:Patient tolerated session well.He was able to tolerate longer period of standing this session with less rest  breaks. He was also able to exhibit better weight shift with increased hip extension with less posterior lean.  Patient does require increased fatigue at end of session but denies any increase in pain; Reinforced HEP;                          PT Education - 09/29/19 1317    Education Details transfers/positioning, LE strengthening, HEP    Person(s) Educated Patient;Child(ren)    Methods Explanation;Verbal cues    Comprehension Verbalized understanding;Returned demonstration;Verbal cues required;Need further instruction            PT Short Term Goals - 08/18/19 1305      PT SHORT TERM GOAL #1   Title Be independent with initial home exercise program for self-management of symptoms.  Baseline HEP to be given at second session; advice to practice unsupported sitting at home (06/19/2018); 07/08/18: Performing at home, 6/29: needs assistance but is doing exercise program regularly; 08/22/18: adherent;    Time 2    Period Weeks    Status Achieved    Target Date 06/26/18             PT Long Term Goals - 08/20/19 1456      PT LONG TERM GOAL #1   Title Patient will complete all bed mobility with min A to improve functional independence for getting in and out of bed and adjusting in bed.     Baseline max A - total A reported by family (06/19/2018); 07/08/18: still requires maxA, dtr reports improvement in rolling since starting therapy; 6/23: min A to supervision in clinic; not doing at home right; 8/5: Pt's daughter states that his rolling has improved, but still has difficulty with all other bed mobility; 10/23/18: pts daughter states that she is doing about 35% of the work if he is in proper position, he does uses the bed rails to help roll;  using Hoyer for coming to sit EOB; 10/12: pts daughter states that she is doing about 30% of the work if he is in proper position, he does uses the bed rails to help roll, but it is improving;  using Hoyer for coming to sit EOB, 10/29:  min A for sit to sidelying, rolling supervision, able to transition sidelying to sit with HHA;    Time 12    Period Weeks    Status Achieved      PT LONG TERM GOAL #2   Title Patient will complete sit <> stand transfer chair to chair with Min A and LRAD to improve functional independence for household and community mobility.     Baseline supervision for STS, still requires elevated surface height to 27"    Time 12    Period Weeks    Status Achieved      PT LONG TERM GOAL #3   Title Patient will navigate power w/c with min A x 100 feet to improve mobility for household and short community distances.    Baseline required total A (06/12/2018); able to roll 20 feet with min A (06/20/2018); 07/08/18: Pt can go approximately 10' without assist per family;  10/23/2018: pt's daughter states that his power WC mobility has become 100% better with the new hand control, slowed speed, and with practice; 10/12: pt's daughter reports that he can perform household WC mobility and limited community ambulation with minA    Time 12    Period Weeks    Status Achieved      PT LONG TERM GOAL #4   Title Patient will complete car transfers W/C to family SUV with min A using LRAD to improve his abilty to participate in community activities.     Baseline unable to complete car transfers at all (06/12/2018); unable to complete car transfers at all (06/19/2018).; 07/08/18: Unable to perform at this time; 09/11/18: unable to perform at this time; 10/23/2018: unable to complete car transfers at all; 10/12:  unable to complete car transfers at all, 10/29: unable; 01/13/19: unable; 2/11: min-mod A +2, 3/10: min A+1    Time 12    Period Weeks    Status Achieved      PT LONG TERM GOAL #5   Title Patient will ambulate at least 10 feet with min A using LRAD to improve mobility for household  and community distances.    Baseline unable to take steps (06/12/2018); able to shift R leg forward and back with max A and RW at edge of plinth  (06/19/2018); 07/08/18: unable to ambulate at this time. 09/11/18: unable to perform at this time; 10/23/2018: unable to perform at this time; 10/12: unable to perform at this time, 10/29: unable at this time; 01/13/19: unable at this time, 2/11: min A to step with RLE, unable to step with LLE, 3/10: able to take 2 steps in parallel bars with min A for safety; 4/1: min A for walking 10 feet in parallel bars, 5/24: min A walking 10 feet in parallel bars; 6/21: no change 7/14: no change    Time 12    Period Weeks    Status Partially Met    Target Date 11/12/19      PT LONG TERM GOAL #6   Title Patient will be able to safety navigate up/down ramp with power chair, exhibiting good safety awareness, mod I to safely enter/exit his home.    Baseline 2/11: supervision with intermittent min A;4/1: supervision; 5/24: supervision    Time 12    Period Weeks    Status Partially Met    Target Date 11/12/19      PT LONG TERM GOAL #7   Title Patient will increase functional reach test to >15 inches in sitting to exhibit improved sitting balance and positioning and reduce fall risk;    Baseline 07/30/18: 12 inches; 09/11/18: 10-12 inches; 10/23/18: 12-13 inch; 10/12: 14.5", 10/29: 15.5 inch    Time 4    Period Weeks    Status Achieved      PT LONG TERM GOAL #8   Title Patient will increase LLE quad strength to 3+/5 to improve functional strength for standing and mobility;    Baseline 10/28: 2+/5; 01/13/19: 2+/5, 2/11: 3-/5, 04/16/19: 3-/5, 05/08/19: 3-/5, 5/24: 3-/5, 6/21: 3-/5, 7/14: 3-/5    Time 12    Period Weeks    Status Partially Met    Target Date 11/12/19      PT LONG TERM GOAL  #9   TITLE Patient will report no vertigo with provoking motions or positions in order to be able to perform ADLs and mobility tasks without symptoms.    Baseline 06/30/19: reports no dizziness in last week; 6/21: one episode this weekend; 7/14: experiencing one a week;    Time 12    Period Weeks    Status Partially Met    Target  Date 11/12/19                 Plan - 09/29/19 1514    Clinical Impression Statement Patient motivated and participated well within session. He was instructed in advanced transfer this session. Patient progressed to close supervision at 27 inch height surface requiring mod VCs for forward weight shift and increased LE push through legs; Patient able to stand longer periods this session with less fatigue. He also was able to exhibit better hip extension with less posterior weight shift. He would benefit from additional skilled PT intervention to improve strength, balance and mobility;    Personal Factors and Comorbidities Age;Comorbidity 3+    Comorbidities Relevant past medical history and comorbidities include long term steroid use for lupus, CKD, chronic osteomyelitis, cervical spine stenosis, BPH, APS, alcohol abuse, peripheral artery disease, depression, GERD, obstructive sleep apnea, HTN, IBS, IgA deficiency, lumbar radiculopathy, RA, Stroke, toxic maculopathy in both eyes, systemic lupus erythematosus related syndrome, cardiac catheterization,  carotid endarterectomy, cervical laminectomy, coronary angioplasty, Fusion C5-C7, knee arthroplasty, lumbar surgery.    Examination-Activity Limitations Bed Mobility;Bend;Sit;Toileting;Stand;Stairs;Lift;Transfers;Squat;Locomotion Level;Carry;Dressing;Hygiene/Grooming;Continence    Examination-Participation Restrictions Yard Work;Interpersonal Relationship;Community Activity    Stability/Clinical Decision Making Evolving/Moderate complexity    Rehab Potential Fair    PT Frequency 3x / week    PT Duration 12 weeks    PT Treatment/Interventions ADLs/Self Care Home Management;Electrical Stimulation;Moist Heat;Cryotherapy;Gait training;Stair training;Functional mobility training;Therapeutic exercise;Balance training;Neuromuscular re-education;Cognitive remediation;Patient/family education;Orthotic Fit/Training;Wheelchair mobility training;Manual  techniques;Passive range of motion;Energy conservation;Joint Manipulations;Canalith Repostioning;Therapeutic activities;Vestibular    PT Next Visit Plan Work on strength, sitting/standing balance, functional activities such as rolling, bed mobility, transfers; caregiver training    PT Home Exercise Plan no updates this session    Consulted and Agree with Plan of Care Patient;Family member/caregiver    Family Member Consulted Wife           Patient will benefit from skilled therapeutic intervention in order to improve the following deficits and impairments:  Abnormal gait, Decreased activity tolerance, Decreased cognition, Decreased endurance, Decreased knowledge of use of DME, Decreased range of motion, Decreased skin integrity, Decreased strength, Impaired perceived functional ability, Impaired sensation, Impaired UE functional use, Improper body mechanics, Pain, Cardiopulmonary status limiting activity, Decreased balance, Decreased coordination, Decreased mobility, Difficulty walking, Impaired tone, Postural dysfunction, Dizziness  Visit Diagnosis: Muscle weakness (generalized)  Other lack of coordination  Difficulty in walking, not elsewhere classified     Problem List Patient Active Problem List   Diagnosis Date Noted  . Sepsis ([place]) 10/18/2018    Aliz Meritt PT, DPT 09/29/2019, 3:21 PM  [place] MAIN [facility] SERVICES [address] [place], [place], [address] Phone: [phone]   Fax:  [phone]  Name: BEREKET GERNERT MRN: [id number] Date of Birth: [birth year]

## 2019-10-01 ENCOUNTER — Ambulatory Visit: Payer: Medicare Other | Admitting: Physical Therapy

## 2019-10-02 ENCOUNTER — Encounter: Payer: Self-pay | Admitting: Physical Therapy

## 2019-10-02 ENCOUNTER — Ambulatory Visit: Payer: Medicare Other | Admitting: Physical Therapy

## 2019-10-02 ENCOUNTER — Encounter: Payer: Self-pay | Admitting: Occupational Therapy

## 2019-10-02 ENCOUNTER — Ambulatory Visit: Payer: Medicare Other | Admitting: Occupational Therapy

## 2019-10-02 ENCOUNTER — Other Ambulatory Visit: Payer: Self-pay

## 2019-10-02 DIAGNOSIS — M6281 Muscle weakness (generalized): Secondary | ICD-10-CM

## 2019-10-02 DIAGNOSIS — R278 Other lack of coordination: Secondary | ICD-10-CM

## 2019-10-02 DIAGNOSIS — R262 Difficulty in walking, not elsewhere classified: Secondary | ICD-10-CM

## 2019-10-02 NOTE — Therapy (Signed)
[place] MAIN [facility] SERVICES [address] [place], [place], [address] Phone: [phone]   Fax:  [phone]  Occupational Therapy Treatment  Patient Details  Name: Kenneth Summers MRN: [id number] Date of Birth: Sep 24, 1945 No data recorded  Encounter Date: 10/02/2019   OT End of Session - 10/02/19 1356    Visit Number 135    Number of Visits 157    Date for OT Re-Evaluation 12/15/19    Authorization Type Progress report period starting 07/21/2019    OT Start Time 1300    OT Stop Time 1345    OT Time Calculation (min) 45 min    Activity Tolerance Patient tolerated treatment well    Behavior During Therapy [facility] for tasks assessed/performed           Past Medical History:  Diagnosis Date  . Actinic keratosis   . Alcohol abuse   . APS (antiphospholipid syndrome) ([place])   . Basal cell carcinoma   . BPH (benign prostatic hyperplasia)   . CAD (coronary artery disease)   . Cellulitis   . Cervical spinal stenosis   . Chronic osteomyelitis ([place])   . CKD (chronic kidney disease)   . CKD (chronic kidney disease)   . Clostridium difficile diarrhea   . Depression   . Diplopia   . Fatigue   . GERD (gastroesophageal reflux disease)   . Hyperlipemia   . Hypertension   . Hypovitaminosis D   . IBS (irritable bowel syndrome)   . 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 Insomnia   . Left lumbosacral radiculopathy   . Moderate obstructive sleep apnea   . Osteomyelitis of foot ([place])   . PAD (peripheral artery disease) ([place])   . Pruritus   . RA (rheumatoid arthritis) ([place])   . Radiculopathy   . Restless legs syndrome   . RLS (restless legs syndrome)   . SLE (systemic lupus erythematosus related syndrome) ([place])   . 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 Stroke ([place])   . Toxic maculopathy of both eyes     Past Surgical History:  Procedure Laterality Date  . CARDIAC CATHETERIZATION    . CAROTID ENDARTERECTOMY    .  CERVICAL LAMINECTOMY    . CORONARY ANGIOPLASTY    . FRACTURE SURGERY    . fusion C5-6-7    . HERNIA REPAIR    . KNEE ARTHROSCOPY    . TONSILLECTOMY      There were no vitals filed for this visit.   Subjective Assessment - 10/02/19 1355    Subjective  Pt. reports having had a bad week.    Patient is accompanied by: Family member    Pertinent History Pt. is a 74 y.o. 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 Pt. PMHx includes: Multiple Falls, Lupus, DJD, Renal Abscess, CVA. Pt. resides with his wife. Pt.'s wife and daughter assist with ADLs. Pt. has caregivers in  for 2 hours a day, 6 days a week. Pt. received Rehab services in acute care, at SNF for STR, and [place] services. Pt. is retired from [employer] for [employer] and [employer].    Patient Stated Goals To regain use of his left UE, and do more for himself.    Currently in Pain? No/denies          OT TREATMENT    Therapeutic Exercise:  Pt. performed BUE therapeutic ex with yellow theraband.  Pt. performed bilateral shoulder flexion, abduction, horizontal abduction, diagonal abduction, elbow flexion, and extension. Education, and training was provided to the pt., and his daughter about proper ther. ex form, hand placement, resistance level, and HEP for carry over at home. Pt. was provided with yellow theraband, and a visual handout.  Pt. reports having a bad week. Pt. reports that he is no longer able to use transportation services with his electric w/c unless accompanied by someone to assist with it. The company is no longer able to assist him with the w/c for transportation. Pt. tolerated BUE exercises well, with no reports of discomfort. Pt. required consistent verbal, visual, and tactile cues to perform UE strengthening exercises. Pt. continues to work on improving BUE strength, and [facility] skills in order to improve LUE functioning during ADLs, IADLs, and tranfers.                          OT Education - 10/02/19 1356    Education Details BUE Therapeutic ex    Person(s) Educated Patient    Methods Explanation;Demonstration    Comprehension Returned demonstration;Tactile cues required;Verbalized understanding;Need further instruction               OT Long Term Goals - 09/22/19 1400      OT LONG TERM GOAL #1   Title Pt. will increase left shoulder flexion AROM by 10 degrees to access cabinet/shelf.    Baseline Pt. continues to progress with shoulder ROM. Marland KitchenShouler flexion 130, and continues to work on reaching with increasing emphasis on flexing trunk to improve functional reach.  07/21/19- shoulder flexion to 130 with effort    Time 12    Period Weeks    Status Partially Met    Target Date 12/15/19      OT LONG TERM GOAL #2   Title Pt. will donn a shirt with Supervision.    Baseline Pt. continues to require MinA donning a zip down jacket with consistent cues to initiate the correct direction of the jacket.  07/21/19 continues to require min assist at times for shirt/jacket    Time 12    Period Weeks    Status On-going    Target Date 12/15/19      OT LONG TERM GOAL #3   Title Pt. will require ModA to perform LE dressing    Baseline MaxA left, ModA with the right. Pt. has sores on his ankle, and feet. Pt.'s daughter is assisting.    Time 12    Period Weeks    Status Deferred      OT LONG TERM GOAL #4   Title Pt. will improve left grip strength by 10# to be able to open a jar/container.    Baseline Pt. conitnues to present with limited left hand grip strength. Pt. continues to work towards opening containers, and bottles.    Time 12    Period Weeks    Status On-going    Target Date 12/15/19      OT LONG TERM GOAL #5   Title Pt. will improve left hand [facility] skills to be able to assist with buttoning/zipping.    Baseline Pt. is improving with manipulating the zipper on his jacket, however continues to require  practice. Pt. continues to work on improving bilateral [facility] skills for buttoning/zipping.  07/21/19 improving with buttons and zippers.    Time 12    Period Weeks    Status On-going    Target Date  12/15/19      OT LONG TERM GOAL #6   Title Pt. will demonstrate visual conmpensatory strategies for 100% of the time during ADLs    Baseline Pt.continues to rerequire intermitten cues cues for left sides awareness.    Time 12    Period Weeks    Status Partially Met    Target Date 12/15/19      OT LONG TERM GOAL #7   Title Pt. will prepare a simple cold snack from w/c with supervision using cognitive compensatory strategies 100% of the time.    Baseline Pt. has difficulty with getting the w/c close enough to access the refrigerator    Time 12    Period Weeks    Status Deferred      OT LONG TERM GOAL #8   Title Pt. will accurately identify potential safety hazard using good safety awareness, and judgement 100% for ADLs, and IADLs.    Baseline Pt. requires cues    Time 12    Period Weeks    Status On-going    Target Date 12/15/19      OT LONG TERM GOAL  #9   TITLE Pt. will  navigate his w/c around obstacles with Supervision and 100% accuracy    Baseline Pt. continues to require cuing for obstacles on the left, however requires less cues overall. Pt. requires significant cues to engage his left UE during tasks. 07/21/2019 continues to improve with decreased assist at times    Time 12    Period Weeks    Status On-going    Target Date 12/15/19      OT LONG TERM GOAL  #10   TITLE Pt. will independently be able  to reach to place items into cabinetry, and closets.    Baseline Pt. BUE strength, and reaching  is limited.    Time 12    Period Weeks    Status New    Target Date 12/15/19      OT LONG TERM GOAL  #11   TITLE Pt. will increase BUE strength by 22mm grades in preparation for ADL transfers.    Baseline Pt. continues to work towards improving overall strength,    Time 12    Period  Weeks    Status On-going    Target Date 12/15/19      OT LONG TERM GOAL  #12   TITLE Pt. will improve LUE functional reaching with the left engaging the trunk 100% of the time during ADL tasks with minimal cues.    Baseline 07/21/2019: Pt. is improving with LUE functional reaching. Pt. conitnues to require cues to engage his trunk for extended reaching.    Time 12    Period Weeks    Status On-going    Target Date 12/15/19                 Plan - 10/02/19 1357    Clinical Impression Statement Pt. reports having a bad week. Pt. reports that he is no longer able to use transportation services with his electric w/c unless accompanied by someone to assist with it. The company is no longer able to assist him with the w/c for transportation. Pt. tolerated BUE exercises well, with no reports of discomfort. Pt. required consistent verbal, visual, and tactile cues to perform UE strengthening exercises. Pt. continues to work on improving BUE strength, and [facility] skills in order to improve LUE functioning during ADLs, IADLs, and tranfers.    OT Occupational Profile and History  Problem Focused Assessment - Including review of records relating to presenting problem    Occupational performance deficits (Please refer to evaluation for details): ADL's    Body Structure / Function / Physical Skills UE functional use;Coordination;FMC;Dexterity;Strength;ROM    Cognitive Skills Attention;Memory;Emotional;Problem Solve;Safety Awareness    Rehab Potential Good    Clinical Decision Making Several treatment options, min-mod task modification necessary    Comorbidities Affecting Occupational Performance: Presence of comorbidities impacting occupational performance    Comorbidities impacting occupational performance description: Phyical, cognitive, visual,  medical comorbidities    Modification or Assistance to Complete Evaluation  No modification of tasks or assist necessary to complete eval    OT Frequency 2x /  week    OT Duration 12 weeks    OT Treatment/Interventions Self-care/ADL training;DME and/or AE instruction;Therapeutic exercise;Therapeutic activities;Moist Heat;Cognitive remediation/compensation;Neuromuscular education;Visual/perceptual remediation/compensation;Coping strategies training;Patient/family education;Passive range of motion;Psychosocial skills training;Energy conservation;Functional Mobility Training    Consulted and Agree with Plan of Care Patient;Family member/caregiver           Patient will benefit from skilled therapeutic intervention in order to improve the following deficits and impairments:   Body Structure / Function / Physical Skills: UE functional use, Coordination, FMC, Dexterity, Strength, ROM Cognitive Skills: Attention, Memory, Emotional, Problem Solve, Safety Awareness     Visit Diagnosis: Muscle weakness (generalized)  Other lack of coordination    Problem List Patient Active Problem List   Diagnosis Date Noted  . Sepsis ([place]) 10/18/2018    Harrel Carina, MS, OTR/L 10/02/2019, 1:59 PM  [place] MAIN [facility] SERVICES [address] [place], [place], [address] Phone: [phone]   Fax:  [phone]  Name: Kenneth Summers MRN: [id number] Date of Birth: [birth year]

## 2019-10-02 NOTE — Therapy (Signed)
[place] MAIN [facility] SERVICES [address] [place], [place], [address] Phone: [phone]   Fax:  [phone]  Physical Therapy Treatment Physical Therapy Progress Note   Dates of reporting period 08/25/19   to  10/02/19   Patient Details  Name: Kenneth Summers MRN: [id number] Date of Birth: 08-Feb-1945 No data recorded  Encounter Date: 10/02/2019   PT End of Session - 10/02/19 1349    Visit Number [id number]    Number of Visits 216    Date for PT Re-Evaluation 11/12/19    Authorization Type Medicare reporting period starting 09/11/18; NO FOTO    Authorization Time Period goals last assessed 08/20/19    PT Start Time 1345    PT Stop Time 1430    PT Time Calculation (min) 45 min    Equipment Utilized During Treatment Gait belt    Activity Tolerance Patient tolerated treatment well;No increased pain    Behavior During Therapy WFL for tasks assessed/performed           Past Medical History:  Diagnosis Date  . Actinic keratosis   . Alcohol abuse   . APS (antiphospholipid syndrome) ([place])   . Basal cell carcinoma   . BPH (benign prostatic hyperplasia)   . CAD (coronary artery disease)   . Cellulitis   . Cervical spinal stenosis   . Chronic osteomyelitis ([place])   . CKD (chronic kidney disease)   . CKD (chronic kidney disease)   . Clostridium difficile diarrhea   . Depression   . Diplopia   . Fatigue   . GERD (gastroesophageal reflux disease)   . Hyperlipemia   . Hypertension   . Hypovitaminosis D   . IBS (irritable bowel syndrome)   . 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 Insomnia   . Left lumbosacral radiculopathy   . Moderate obstructive sleep apnea   . Osteomyelitis of foot ([place])   . PAD (peripheral artery disease) ([place])   . Pruritus   . RA (rheumatoid arthritis) ([place])   . Radiculopathy   . Restless legs syndrome   . RLS (restless legs syndrome)   . SLE (systemic lupus erythematosus related syndrome) ([place])   . Squamous cell carcinoma  of skin 01/27/2019   right mid lower ear helix  . Stroke ([place])   . Toxic maculopathy of both eyes     Past Surgical History:  Procedure Laterality Date  . CARDIAC CATHETERIZATION    . CAROTID ENDARTERECTOMY    . CERVICAL LAMINECTOMY    . CORONARY ANGIOPLASTY    . FRACTURE SURGERY    . fusion C5-6-7    . HERNIA REPAIR    . KNEE ARTHROSCOPY    . TONSILLECTOMY      There were no vitals filed for this visit.   Subjective Assessment - 10/02/19 1348    Subjective Patient reports doing well today; He reports his transportation is no longer going to be able to help him get on/off the van. They are working on getting help to get on/off;    Patient is accompained by: Family member    Pertinent History Patient is a 74 y.o. male who presents to outpatient physical therapy with a referral for medical diagnosis of CVA. This patient's chief complaints consist of left hemiplegia and overall deconditioning leading to the following functional deficits: dependent for ADLs, IADLs, unable to transfer to car, dependent transfers at home with hoyer lift, unable to walk or stand without significant assistance, difficulty with W/C navigation.Marland Kitchen  Limitations Sitting;Lifting;Standing;House hold activities;Writing;Walking;Other (comment)    Diagnostic tests Brain MRI 11/01/2017: IMPRESSION: 1. Acute right ACA territory infarct as demonstrated on prior CT imaging. No intracranial hemorrhage or significant mass effect. 2. 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    Patient Stated Goals wants to be able to walk again    Currently in Pain? No/denies    Pain Onset Yesterday    Pain Onset Today                TREATMENT: Prior to transfersand in between transfers: Patient sittingin wheelchair: -Hip adduction ball squeeze x10 reps with mod VCs to increase AROM with LLE -Hip adduction without ball to challenge LLE AROM x5 reps;  -hip flexion march x5 reps each  LE Required min VCs and visual cues for better AROM to increase strengthening; Following marches, patient reports episode of nystagmus and vertigo symptoms. Patient instructed to rest, provided with emesis bag; After 10 min, nystagmus eased off but patient still mildly dizzy;   Ptrequires verbal and visual cues to improve ROM;He does have difficulty moving LLE this session due to weakness and fatigue;   Prior to Exercise:  Transferred sit<>Stand in parallel bars (seat adjusted to27inch from floor height) X 1repwith min A;herequiredmodVCs for forward weight shift for better trunk control and transfer ability; Pt exhibits heavy lean to LLE and has difficulty achieving neutral weight shift. Patient heavily fatigue and unable to stand for more than 5 sec due to weakness and heavy flexed position;   Patient requiredmodA to scoot back in chair at end of session due to increased fatigue    Response to treatment:Pt tolerated session fair. He exhibits increased fatigue with poor trunk control this session. He exhibits heavy left lateral lean with difficulty maintaining midline. During session, patient experienced vertigo symptoms with nystagmus. Had patient rest for 10 min with symptom resolution. Recommend patient rest for the rest of the day. Stopped session early due to symptoms and fatigue. Reinforced HEP provided patient feeling better this weekend;    [name] condition has the potential to improve in response to therapy. Maximum improvement is yet to be obtained. The anticipated improvement is attainable and reasonable in a generally predictable time.  Patient reports adherence with HEP. He does report having increased dizziness last night and today which could have made session more difficult.                         PT Education - 10/02/19 1349    Education Details transfers/positioning, LE strengthening, HEP    Person(s) Educated Patient;Child(ren)     Methods Explanation;Verbal cues    Comprehension Verbalized understanding;Returned demonstration;Verbal cues required;Need further instruction            PT Short Term Goals - 08/18/19 1305      PT SHORT TERM GOAL #1   Title Be independent with initial home exercise program for self-management of symptoms.    Baseline HEP to be given at second session; advice to practice unsupported sitting at home (06/19/2018); 07/08/18: Performing at home, 6/29: needs assistance but is doing exercise program regularly; 08/22/18: adherent;    Time 2    Period Weeks    Status Achieved    Target Date 06/26/18             PT Long Term Goals - 10/02/19 1524      PT LONG TERM GOAL #1   Title Patient will complete  all bed mobility with min A to improve functional independence for getting in and out of bed and adjusting in bed.     Baseline max A - total A reported by family (06/19/2018); 07/08/18: still requires maxA, dtr reports improvement in rolling since starting therapy; 6/23: min A to supervision in clinic; not doing at home right; 8/5: Pt's daughter states that his rolling has improved, but still has difficulty with all other bed mobility; 10/23/18: pts daughter states that she is doing about 35% of the work if he is in proper position, he does uses the bed rails to help roll;  using Hoyer for coming to sit EOB; 10/12: pts daughter states that she is doing about 30% of the work if he is in proper position, he does uses the bed rails to help roll, but it is improving;  using Hoyer for coming to sit EOB, 10/29: min A for sit to sidelying, rolling supervision, able to transition sidelying to sit with HHA;    Time 12    Period Weeks    Status Achieved    Target Date 12/04/18      PT LONG TERM GOAL #2   Title Patient will complete sit <> stand transfer chair to chair with Min A and LRAD to improve functional independence for household and community mobility.     Baseline supervision for STS, still requires  elevated surface height to 27"    Time 12    Period Weeks    Status Achieved    Target Date 05/23/19      PT LONG TERM GOAL #3   Title Patient will navigate power w/c with min A x 100 feet to improve mobility for household and short community distances.    Baseline required total A (06/12/2018); able to roll 20 feet with min A (06/20/2018); 07/08/18: Pt can go approximately 10' without assist per family;  10/23/2018: pt's daughter states that his power WC mobility has become 100% better with the new hand control, slowed speed, and with practice; 10/12: pt's daughter reports that he can perform household WC mobility and limited community ambulation with minA    Time 12    Period Weeks    Status Achieved    Target Date 05/23/19      PT LONG TERM GOAL #4   Title Patient will complete car transfers W/C to family SUV with min A using LRAD to improve his abilty to participate in community activities.     Baseline unable to complete car transfers at all (06/12/2018); unable to complete car transfers at all (06/19/2018).; 07/08/18: Unable to perform at this time; 09/11/18: unable to perform at this time; 10/23/2018: unable to complete car transfers at all; 10/12:  unable to complete car transfers at all, 10/29: unable; 01/13/19: unable; 2/11: min-mod A +2, 3/10: min A+1    Time 12    Period Weeks    Status Achieved    Target Date 05/23/19      PT LONG TERM GOAL #5   Title Patient will ambulate at least 10 feet with min A using LRAD to improve mobility for household and community distances.    Baseline unable to take steps (06/12/2018); able to shift R leg forward and back with max A and RW at edge of plinth (06/19/2018); 07/08/18: unable to ambulate at this time. 09/11/18: unable to perform at this time; 10/23/2018: unable to perform at this time; 10/12: unable to perform at this time, 10/29: unable at this time;  01/13/19: unable at this time, 2/11: min A to step with RLE, unable to step with LLE, 3/10: able to take 2 steps  in parallel bars with min A for safety; 4/1: min A for walking 10 feet in parallel bars, 5/24: min A walking 10 feet in parallel bars; 6/21: no change 7/14: no change    Time 12    Period Weeks    Status Partially Met    Target Date 11/12/19      PT LONG TERM GOAL #6   Title Patient will be able to safety navigate up/down ramp with power chair, exhibiting good safety awareness, mod I to safely enter/exit his home.    Baseline 2/11: supervision with intermittent min A;4/1: supervision; 5/24: supervision    Time 12    Period Weeks    Status Partially Met    Target Date 11/12/19      PT LONG TERM GOAL #7   Title Patient will increase functional reach test to >15 inches in sitting to exhibit improved sitting balance and positioning and reduce fall risk;    Baseline 07/30/18: 12 inches; 09/11/18: 10-12 inches; 10/23/18: 12-13 inch; 10/12: 14.5", 10/29: 15.5 inch    Time 4    Period Weeks    Status Achieved    Target Date 11/12/19      PT LONG TERM GOAL #8   Title Patient will increase LLE quad strength to 3+/5 to improve functional strength for standing and mobility;    Baseline 10/28: 2+/5; 01/13/19: 2+/5, 2/11: 3-/5, 04/16/19: 3-/5, 05/08/19: 3-/5, 5/24: 3-/5, 6/21: 3-/5, 7/14: 3-/5    Time 12    Period Weeks    Status Partially Met    Target Date 11/12/19      PT LONG TERM GOAL  #9   TITLE Patient will report no vertigo with provoking motions or positions in order to be able to perform ADLs and mobility tasks without symptoms.    Baseline 06/30/19: reports no dizziness in last week; 6/21: one episode this weekend; 7/14: experiencing one a week;    Time 12    Period Weeks    Status Partially Met    Target Date 11/12/19                 Plan - 10/02/19 1521    Clinical Impression Statement Patient had a harder time this session with poor trunk control with heavy left sided lean. Instructed patient in sit<>Stand transfer with min A required with increased trunk lean and decreased  weight shift. Patient quickly fatigued having to sit down. He was instructed in LE strengthening. However during exercise, he experienced vertigo symptoms with nystagmus. Had patient sit and rest. Within 10 min the symptoms mostly resolved. Ended session early as patient very fatigued and dizzy. Recommend patient rest the rest of the day. Reinforced HEP for when patient is feeling better. Patient could be experiencing a harder time this session for several reasons including increased heat outside, being concerned about transportation and recent vertigo symptoms. He would benefit from additional skilled PT Intervention to improve strength and mobility;    Personal Factors and Comorbidities Age;Comorbidity 3+    Comorbidities Relevant past medical history and comorbidities include long term steroid use for lupus, CKD, chronic osteomyelitis, cervical spine stenosis, BPH, APS, alcohol abuse, peripheral artery disease, depression, GERD, obstructive sleep apnea, HTN, IBS, IgA deficiency, lumbar radiculopathy, RA, Stroke, toxic maculopathy in both eyes, systemic lupus erythematosus related syndrome, cardiac catheterization, carotid endarterectomy, cervical laminectomy, coronary  angioplasty, Fusion C5-C7, knee arthroplasty, lumbar surgery.    Examination-Activity Limitations Bed Mobility;Bend;Sit;Toileting;Stand;Stairs;Lift;Transfers;Squat;Locomotion Level;Carry;Dressing;Hygiene/Grooming;Continence    Examination-Participation Restrictions Yard Work;Interpersonal Relationship;Community Activity    Stability/Clinical Decision Making Evolving/Moderate complexity    Rehab Potential Fair    PT Frequency 3x / week    PT Duration 12 weeks    PT Treatment/Interventions ADLs/Self Care Home Management;Electrical Stimulation;Moist Heat;Cryotherapy;Gait training;Stair training;Functional mobility training;Therapeutic exercise;Balance training;Neuromuscular re-education;Cognitive remediation;Patient/family education;Orthotic  Fit/Training;Wheelchair mobility training;Manual techniques;Passive range of motion;Energy conservation;Joint Manipulations;Canalith Repostioning;Therapeutic activities;Vestibular    PT Next Visit Plan Work on strength, sitting/standing balance, functional activities such as rolling, bed mobility, transfers; caregiver training    PT Home Exercise Plan no updates this session    Consulted and Agree with Plan of Care Patient;Family member/caregiver    Family Member Consulted Wife           Patient will benefit from skilled therapeutic intervention in order to improve the following deficits and impairments:  Abnormal gait, Decreased activity tolerance, Decreased cognition, Decreased endurance, Decreased knowledge of use of DME, Decreased range of motion, Decreased skin integrity, Decreased strength, Impaired perceived functional ability, Impaired sensation, Impaired UE functional use, Improper body mechanics, Pain, Cardiopulmonary status limiting activity, Decreased balance, Decreased coordination, Decreased mobility, Difficulty walking, Impaired tone, Postural dysfunction, Dizziness  Visit Diagnosis: Muscle weakness (generalized)  Other lack of coordination  Difficulty in walking, not elsewhere classified     Problem List Patient Active Problem List   Diagnosis Date Noted  . Sepsis ([place]) 10/18/2018    Nandini Bogdanski PT, DPT 10/02/2019, 3:27 PM  [place] MAIN [facility] SERVICES [address] [place], [place], [address] Phone: [phone]   Fax:  [phone]  Name: Kenneth Summers MRN: [id number] Date of Birth: [birth year]

## 2019-10-06 ENCOUNTER — Ambulatory Visit: Payer: Medicare Other | Admitting: Physical Therapy

## 2019-10-06 ENCOUNTER — Encounter: Payer: Self-pay | Admitting: Occupational Therapy

## 2019-10-06 ENCOUNTER — Other Ambulatory Visit: Payer: Self-pay

## 2019-10-06 ENCOUNTER — Ambulatory Visit: Payer: Medicare Other | Admitting: Occupational Therapy

## 2019-10-06 ENCOUNTER — Encounter: Payer: Self-pay | Admitting: Physical Therapy

## 2019-10-06 DIAGNOSIS — R278 Other lack of coordination: Secondary | ICD-10-CM

## 2019-10-06 DIAGNOSIS — M6281 Muscle weakness (generalized): Secondary | ICD-10-CM

## 2019-10-06 DIAGNOSIS — I69354 Hemiplegia and hemiparesis following cerebral infarction affecting left non-dominant side: Secondary | ICD-10-CM

## 2019-10-06 DIAGNOSIS — R262 Difficulty in walking, not elsewhere classified: Secondary | ICD-10-CM

## 2019-10-06 NOTE — Therapy (Signed)
[place] MAIN [facility] SERVICES [address] [place], [place], [address] Phone: [phone]   Fax:  [phone]  Physical Therapy Treatment  Patient Details  Name: Kenneth Summers MRN: [id number] Date of Birth: February 13, 1945 No data recorded  Encounter Date: 10/06/2019   PT End of Session - 10/06/19 1323    Visit Number [id number]    Number of Visits 216    Date for PT Re-Evaluation 11/12/19    Authorization Type Medicare reporting period starting 09/11/18; NO FOTO    Authorization Time Period goals last assessed 08/20/19    PT Start Time 1302    PT Stop Time 1345    PT Time Calculation (min) 43 min    Equipment Utilized During Treatment Gait belt    Activity Tolerance Patient tolerated treatment well;No increased pain    Behavior During Therapy WFL for tasks assessed/performed           Past Medical History:  Diagnosis Date  . Actinic keratosis   . Alcohol abuse   . APS (antiphospholipid syndrome) ([place])   . Basal cell carcinoma   . BPH (benign prostatic hyperplasia)   . CAD (coronary artery disease)   . Cellulitis   . Cervical spinal stenosis   . Chronic osteomyelitis ([place])   . CKD (chronic kidney disease)   . CKD (chronic kidney disease)   . Clostridium difficile diarrhea   . Depression   . Diplopia   . Fatigue   . GERD (gastroesophageal reflux disease)   . Hyperlipemia   . Hypertension   . Hypovitaminosis D   . IBS (irritable bowel syndrome)   . 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 Insomnia   . Left lumbosacral radiculopathy   . Moderate obstructive sleep apnea   . Osteomyelitis of foot ([place])   . PAD (peripheral artery disease) ([place])   . Pruritus   . RA (rheumatoid arthritis) ([place])   . Radiculopathy   . Restless legs syndrome   . RLS (restless legs syndrome)   . SLE (systemic lupus erythematosus related syndrome) ([place])   . 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 Stroke ([place])   . Toxic maculopathy of  both eyes     Past Surgical History:  Procedure Laterality Date  . CARDIAC CATHETERIZATION    . CAROTID ENDARTERECTOMY    . CERVICAL LAMINECTOMY    . CORONARY ANGIOPLASTY    . FRACTURE SURGERY    . fusion C5-6-7    . HERNIA REPAIR    . KNEE ARTHROSCOPY    . TONSILLECTOMY      There were no vitals filed for this visit.   Subjective Assessment - 10/06/19 1320    Subjective Patient reports doing well; He denies any dizziness currently; Reports being able to stand 2x over the weekend;    Patient is accompained by: Family member    Pertinent History Patient is a 74 y.o. male who presents to outpatient physical therapy with a referral for medical diagnosis of CVA. This patient's chief complaints consist of left hemiplegia and overall deconditioning leading to the following functional deficits: dependent for ADLs, IADLs, unable to transfer to car, dependent transfers at home with hoyer lift, unable to walk or stand without significant assistance, difficulty with W/C navigation..    Limitations Sitting;Lifting;Standing;House hold activities;Writing;Walking;Other (comment)    Diagnostic tests Brain MRI 11/01/2017: IMPRESSION: 1. Acute right ACA territory infarct as demonstrated on prior CT imaging. No intracranial hemorrhage  or significant mass effect. 2. 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    Patient Stated Goals wants to be able to walk again    Currently in Pain? No/denies    Pain Onset Yesterday    Pain Onset Today                TREATMENT: Prior to transfersand in between transfers: Patient sittingin wheelchair: -hip flexion march x15 reps each LE -arms in lap, resting UE, erect posture with cues for neutral weight shift;  Required min VCs and visual cues for better AROM to increase strengthening;  Ptrequires verbal and visual cues to improve ROM;He was able to exhibit improved LLE AROM with cues and increased repetition;  Patient also requires cues for erect posture for better trunk control;  Following exercise: Transferred sit<>Stand in parallel bars (seat adjusted to27inch from floor height) X5 repwith  Min A-CGA;herequiredminVCs for forward weight shift for better trunk control and transfer ability; Patient able to exhibit better transfer ability with increased forward weight shiftwith less posterior loss of balance. However he does exhibit increased left lateral lean this session;  Patient required min Ato CGA for standing posture. He was able to exhibit less posterior lean but does require cues for increased hip/knee extension for better weight bearing in BLE in standing;  While standing: -mini squatx10 reps with min A for safety; - side/side weight shift x10 reps with min A and cues for erect posture including increased knee extension;  -stepping over 1/2 bolster with RLE x2 reps with therapist blocking LLE knee for increased LLE knee extension, min A for safety; With stepping over patient does exhibit increased LLE knee buckling requiring max A +2 for getting back to chair to avoid fall;   Patient requiredmodA to scoot back in chair at end of session due to increased fatigue    Response to treatment:Patient tolerated session well.He was able to tolerate longer period of standing this session with less rest breaks. He continues to require cues for neutral weight shift. He does exhibit increased LLE knee flexion in standing with less quad activation indicating increased weakness. Instructed patient in stepping over obstacle with RLE with patient's LLE knee buckling each time due to weakness requiring max A +2 for getting back in chair; He does report increased fatigue at end of session;                       PT Education - 10/06/19 1323    Education Details transfers/positioning, LE strengthening, HEP    Person(s) Educated Patient;Child(ren)    Methods  Explanation;Verbal cues    Comprehension Verbalized understanding;Returned demonstration;Verbal cues required;Need further instruction            PT Short Term Goals - 08/18/19 1305      PT SHORT TERM GOAL #1   Title Be independent with initial home exercise program for self-management of symptoms.    Baseline HEP to be given at second session; advice to practice unsupported sitting at home (06/19/2018); 07/08/18: Performing at home, 6/29: needs assistance but is doing exercise program regularly; 08/22/18: adherent;    Time 2    Period Weeks    Status Achieved    Target Date 06/26/18             PT Long Term Goals - 10/02/19 1524      PT LONG TERM GOAL #1   Title Patient will complete all bed mobility with  min A to improve functional independence for getting in and out of bed and adjusting in bed.     Baseline max A - total A reported by family (06/19/2018); 07/08/18: still requires maxA, dtr reports improvement in rolling since starting therapy; 6/23: min A to supervision in clinic; not doing at home right; 8/5: Pt's daughter states that his rolling has improved, but still has difficulty with all other bed mobility; 10/23/18: pts daughter states that she is doing about 35% of the work if he is in proper position, he does uses the bed rails to help roll;  using Hoyer for coming to sit EOB; 10/12: pts daughter states that she is doing about 30% of the work if he is in proper position, he does uses the bed rails to help roll, but it is improving;  using Hoyer for coming to sit EOB, 10/29: min A for sit to sidelying, rolling supervision, able to transition sidelying to sit with HHA;    Time 12    Period Weeks    Status Achieved    Target Date 12/04/18      PT LONG TERM GOAL #2   Title Patient will complete sit <> stand transfer chair to chair with Min A and LRAD to improve functional independence for household and community mobility.     Baseline supervision for STS, still requires elevated  surface height to 27"    Time 12    Period Weeks    Status Achieved    Target Date 05/23/19      PT LONG TERM GOAL #3   Title Patient will navigate power w/c with min A x 100 feet to improve mobility for household and short community distances.    Baseline required total A (06/12/2018); able to roll 20 feet with min A (06/20/2018); 07/08/18: Pt can go approximately 10' without assist per family;  10/23/2018: pt's daughter states that his power WC mobility has become 100% better with the new hand control, slowed speed, and with practice; 10/12: pt's daughter reports that he can perform household WC mobility and limited community ambulation with minA    Time 12    Period Weeks    Status Achieved    Target Date 05/23/19      PT LONG TERM GOAL #4   Title Patient will complete car transfers W/C to family SUV with min A using LRAD to improve his abilty to participate in community activities.     Baseline unable to complete car transfers at all (06/12/2018); unable to complete car transfers at all (06/19/2018).; 07/08/18: Unable to perform at this time; 09/11/18: unable to perform at this time; 10/23/2018: unable to complete car transfers at all; 10/12:  unable to complete car transfers at all, 10/29: unable; 01/13/19: unable; 2/11: min-mod A +2, 3/10: min A+1    Time 12    Period Weeks    Status Achieved    Target Date 05/23/19      PT LONG TERM GOAL #5   Title Patient will ambulate at least 10 feet with min A using LRAD to improve mobility for household and community distances.    Baseline unable to take steps (06/12/2018); able to shift R leg forward and back with max A and RW at edge of plinth (06/19/2018); 07/08/18: unable to ambulate at this time. 09/11/18: unable to perform at this time; 10/23/2018: unable to perform at this time; 10/12: unable to perform at this time, 10/29: unable at this time; 01/13/19: unable at this  time, 2/11: min A to step with RLE, unable to step with LLE, 3/10: able to take 2 steps in  parallel bars with min A for safety; 4/1: min A for walking 10 feet in parallel bars, 5/24: min A walking 10 feet in parallel bars; 6/21: no change 7/14: no change    Time 12    Period Weeks    Status Partially Met    Target Date 11/12/19      PT LONG TERM GOAL #6   Title Patient will be able to safety navigate up/down ramp with power chair, exhibiting good safety awareness, mod I to safely enter/exit his home.    Baseline 2/11: supervision with intermittent min A;4/1: supervision; 5/24: supervision    Time 12    Period Weeks    Status Partially Met    Target Date 11/12/19      PT LONG TERM GOAL #7   Title Patient will increase functional reach test to >15 inches in sitting to exhibit improved sitting balance and positioning and reduce fall risk;    Baseline 07/30/18: 12 inches; 09/11/18: 10-12 inches; 10/23/18: 12-13 inch; 10/12: 14.5", 10/29: 15.5 inch    Time 4    Period Weeks    Status Achieved    Target Date 11/12/19      PT LONG TERM GOAL #8   Title Patient will increase LLE quad strength to 3+/5 to improve functional strength for standing and mobility;    Baseline 10/28: 2+/5; 01/13/19: 2+/5, 2/11: 3-/5, 04/16/19: 3-/5, 05/08/19: 3-/5, 5/24: 3-/5, 6/21: 3-/5, 7/14: 3-/5    Time 12    Period Weeks    Status Partially Met    Target Date 11/12/19      PT LONG TERM GOAL  #9   TITLE Patient will report no vertigo with provoking motions or positions in order to be able to perform ADLs and mobility tasks without symptoms.    Baseline 06/30/19: reports no dizziness in last week; 6/21: one episode this weekend; 7/14: experiencing one a week;    Time 12    Period Weeks    Status Partially Met    Target Date 11/12/19                 Plan - 10/06/19 1445    Clinical Impression Statement Patient motivated and participated well within session. He was instructed in advanced LE strengthening exercise. Patient initially exhibits decreased ROM on LLE but was able to exhibit improved ROM  with increased repetition. He does exhibit improved forward weight shift this session but does have difficulty pushing through LLE. Patient exhibits increased weakness in LLE having difficulty extending LLE knee. Instructed patient in advanced standing activities with stepping over 1/2 bolster. Patient had significant difficulty stepping over with RLE with decreased weight shift to LLE with 2 episodes of LLE Knee buckling. He did require max A +2 for getting back in chair. Patient would benefit from additional skilled PT Intervention to improve strength, balance and mobility;    Personal Factors and Comorbidities Age;Comorbidity 3+    Comorbidities Relevant past medical history and comorbidities include long term steroid use for lupus, CKD, chronic osteomyelitis, cervical spine stenosis, BPH, APS, alcohol abuse, peripheral artery disease, depression, GERD, obstructive sleep apnea, HTN, IBS, IgA deficiency, lumbar radiculopathy, RA, Stroke, toxic maculopathy in both eyes, systemic lupus erythematosus related syndrome, cardiac catheterization, carotid endarterectomy, cervical laminectomy, coronary angioplasty, Fusion C5-C7, knee arthroplasty, lumbar surgery.    Examination-Activity Limitations Bed Mobility;Bend;Sit;Toileting;Stand;Stairs;Lift;Transfers;Squat;Locomotion Level;Carry;Dressing;Hygiene/Grooming;Continence  Examination-Participation Restrictions Yard Work;Interpersonal Relationship;Community Activity    Stability/Clinical Decision Making Evolving/Moderate complexity    Rehab Potential Fair    PT Frequency 3x / week    PT Duration 12 weeks    PT Treatment/Interventions ADLs/Self Care Home Management;Electrical Stimulation;Moist Heat;Cryotherapy;Gait training;Stair training;Functional mobility training;Therapeutic exercise;Balance training;Neuromuscular re-education;Cognitive remediation;Patient/family education;Orthotic Fit/Training;Wheelchair mobility training;Manual techniques;Passive range of  motion;Energy conservation;Joint Manipulations;Canalith Repostioning;Therapeutic activities;Vestibular    PT Next Visit Plan Work on strength, sitting/standing balance, functional activities such as rolling, bed mobility, transfers; caregiver training    PT Home Exercise Plan no updates this session    Consulted and Agree with Plan of Care Patient;Family member/caregiver    Family Member Consulted Wife           Patient will benefit from skilled therapeutic intervention in order to improve the following deficits and impairments:  Abnormal gait, Decreased activity tolerance, Decreased cognition, Decreased endurance, Decreased knowledge of use of DME, Decreased range of motion, Decreased skin integrity, Decreased strength, Impaired perceived functional ability, Impaired sensation, Impaired UE functional use, Improper body mechanics, Pain, Cardiopulmonary status limiting activity, Decreased balance, Decreased coordination, Decreased mobility, Difficulty walking, Impaired tone, Postural dysfunction, Dizziness  Visit Diagnosis: Muscle weakness (generalized)  Other lack of coordination  Difficulty in walking, not elsewhere classified  Hemiplegia and hemiparesis following cerebral infarction affecting left non-dominant side [facility])     Problem List Patient Active Problem List   Diagnosis Date Noted  . Sepsis ([place]) 10/18/2018    Orla Jolliff PT, DPT 10/06/2019, 3:29 PM  [place] MAIN [facility] SERVICES [address] [place], [place], [address] Phone: [phone]   Fax:  [phone]  Name: ASAPH SERENA MRN: [id number] Date of Birth: May 18, 1945

## 2019-10-06 NOTE — Therapy (Signed)
[place] MAIN [facility] SERVICES [address] [place], [place], [address] Phone: [phone]   Fax:  [phone]  Occupational Therapy Treatment  Patient Details  Name: Kenneth Summers MRN: [id number] Date of Birth: 1945/07/04 No data recorded  Encounter Date: 10/06/2019   OT End of Session - 10/06/19 1355    Visit Number 136    Number of Visits 157    Date for OT Re-Evaluation 12/15/19    Authorization Type Progress report period starting 07/21/2019    OT Start Time 1350    OT Stop Time 1430    OT Time Calculation (min) 40 min    Activity Tolerance Patient tolerated treatment well    Behavior During Therapy [facility] for tasks assessed/performed           Past Medical History:  Diagnosis Date  . Actinic keratosis   . Alcohol abuse   . APS (antiphospholipid syndrome) ([place])   . Basal cell carcinoma   . BPH (benign prostatic hyperplasia)   . CAD (coronary artery disease)   . Cellulitis   . Cervical spinal stenosis   . Chronic osteomyelitis ([place])   . CKD (chronic kidney disease)   . CKD (chronic kidney disease)   . Clostridium difficile diarrhea   . Depression   . Diplopia   . Fatigue   . GERD (gastroesophageal reflux disease)   . Hyperlipemia   . Hypertension   . Hypovitaminosis D   . IBS (irritable bowel syndrome)   . 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 Insomnia   . Left lumbosacral radiculopathy   . Moderate obstructive sleep apnea   . Osteomyelitis of foot ([place])   . PAD (peripheral artery disease) ([place])   . Pruritus   . RA (rheumatoid arthritis) ([place])   . Radiculopathy   . Restless legs syndrome   . RLS (restless legs syndrome)   . SLE (systemic lupus erythematosus related syndrome) ([place])   . 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 Stroke ([place])   . Toxic maculopathy of both eyes     Past Surgical History:  Procedure Laterality Date  . CARDIAC CATHETERIZATION    . CAROTID ENDARTERECTOMY    .  CERVICAL LAMINECTOMY    . CORONARY ANGIOPLASTY    . FRACTURE SURGERY    . fusion C5-6-7    . HERNIA REPAIR    . KNEE ARTHROSCOPY    . TONSILLECTOMY      There were no vitals filed for this visit.   Subjective Assessment - 10/06/19 1353    Subjective  Pt. reports that he is doing exercises at home.    Patient is accompanied by: Family member    Pertinent History Pt. is a 74 y.o. 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 Pt. PMHx includes: Multiple Falls, Lupus, DJD, Renal Abscess, CVA. Pt. resides with his wife. Pt.'s wife and daughter assist with ADLs. Pt. has caregivers in  for 2 hours a day, 6 days a week. Pt. received Rehab services in acute care, at SNF for STR, and [place] services. Pt. is retired from [employer] for [employer] and [employer].    Patient Stated Goals To regain use of his left UE, and do more for himself.    Currently in Pain? No/denies          OT TREATMENT    Therapeutic Exercise:  Pt. performed 2# dowel ex. For  UE strengthening secondary to weakness. Bilateral shoulder flexion, chest press, circular patterns, and elbow flexion/extension were performed for 1 set 10 reps each. Pt. performed BUE therapeutic ex with red theraband. Pt. performed bilateral shoulder flexion, abduction, horizontal abduction, diagonal abduction, elbow flexion, and extension for 1 set 20 reps. Pt. Performed 2# dumbbell ex. for elbow flexion and extension, forearm supination/pronation, wrist flexion/extension, and radial deviation for 1 set 20 reps each. Pt. requires rest breaks and verbal cues for proper technique. UE exercises were performed in preparation for increasing UE strength during ADLs, and IADL tasks.  Pt. and his daughter reports that the patient is working with the yellow theraband at home.  Pt. tolerated BUE exercises well, with no reports of discomfort. Pt. required consistent verbal, visual, and tactile cues to perform UE strengthening  exercises. Pt. continues to work on improving BUE strength, and [facility] skills in order to improve LUE functioning during ADLs, IADLs, and tranfers.                      OT Education - 10/06/19 1355    Education Details BUE Therapeutic ex    Person(s) Educated Patient    Methods Explanation;Demonstration    Comprehension Returned demonstration;Tactile cues required;Verbalized understanding;Need further instruction               OT Long Term Goals - 09/22/19 1400      OT LONG TERM GOAL #1   Title Pt. will increase left shoulder flexion AROM by 10 degrees to access cabinet/shelf.    Baseline Pt. continues to progress with shoulder ROM. Marland KitchenShouler flexion 130, and continues to work on reaching with increasing emphasis on flexing trunk to improve functional reach.  07/21/19- shoulder flexion to 130 with effort    Time 12    Period Weeks    Status Partially Met    Target Date 12/15/19      OT LONG TERM GOAL #2   Title Pt. will donn a shirt with Supervision.    Baseline Pt. continues to require MinA donning a zip down jacket with consistent cues to initiate the correct direction of the jacket.  07/21/19 continues to require min assist at times for shirt/jacket    Time 12    Period Weeks    Status On-going    Target Date 12/15/19      OT LONG TERM GOAL #3   Title Pt. will require ModA to perform LE dressing    Baseline MaxA left, ModA with the right. Pt. has sores on his ankle, and feet. Pt.'s daughter is assisting.    Time 12    Period Weeks    Status Deferred      OT LONG TERM GOAL #4   Title Pt. will improve left grip strength by 10# to be able to open a jar/container.    Baseline Pt. conitnues to present with limited left hand grip strength. Pt. continues to work towards opening containers, and bottles.    Time 12    Period Weeks    Status On-going    Target Date 12/15/19      OT LONG TERM GOAL #5   Title Pt. will improve left hand [facility] skills to be able to  assist with buttoning/zipping.    Baseline Pt. is improving with manipulating the zipper on his jacket, however continues to require practice. Pt. continues to work on improving bilateral [facility] skills for buttoning/zipping.  07/21/19 improving with buttons and zippers.  Time 12    Period Weeks    Status On-going    Target Date 12/15/19      OT LONG TERM GOAL #6   Title Pt. will demonstrate visual conmpensatory strategies for 100% of the time during ADLs    Baseline Pt.continues to rerequire intermitten cues cues for left sides awareness.    Time 12    Period Weeks    Status Partially Met    Target Date 12/15/19      OT LONG TERM GOAL #7   Title Pt. will prepare a simple cold snack from w/c with supervision using cognitive compensatory strategies 100% of the time.    Baseline Pt. has difficulty with getting the w/c close enough to access the refrigerator    Time 12    Period Weeks    Status Deferred      OT LONG TERM GOAL #8   Title Pt. will accurately identify potential safety hazard using good safety awareness, and judgement 100% for ADLs, and IADLs.    Baseline Pt. requires cues    Time 12    Period Weeks    Status On-going    Target Date 12/15/19      OT LONG TERM GOAL  #9   TITLE Pt. will  navigate his w/c around obstacles with Supervision and 100% accuracy    Baseline Pt. continues to require cuing for obstacles on the left, however requires less cues overall. Pt. requires significant cues to engage his left UE during tasks. 07/21/2019 continues to improve with decreased assist at times    Time 12    Period Weeks    Status On-going    Target Date 12/15/19      OT LONG TERM GOAL  #10   TITLE Pt. will independently be able  to reach to place items into cabinetry, and closets.    Baseline Pt. BUE strength, and reaching  is limited.    Time 12    Period Weeks    Status New    Target Date 12/15/19      OT LONG TERM GOAL  #11   TITLE Pt. will increase BUE strength by 57mm  grades in preparation for ADL transfers.    Baseline Pt. continues to work towards improving overall strength,    Time 12    Period Weeks    Status On-going    Target Date 12/15/19      OT LONG TERM GOAL  #12   TITLE Pt. will improve LUE functional reaching with the left engaging the trunk 100% of the time during ADL tasks with minimal cues.    Baseline 07/21/2019: Pt. is improving with LUE functional reaching. Pt. conitnues to require cues to engage his trunk for extended reaching.    Time 12    Period Weeks    Status On-going    Target Date 12/15/19                 Plan - 10/06/19 1402    Clinical Impression Statement Pt. and his daughter reports that the patient is working with the yellow theraband at home.  Pt. tolerated BUE exercises well, with no reports of discomfort. Pt. required consistent verbal, visual, and tactile cues to perform UE strengthening exercises. Pt. continues to work on improving BUE strength, and [facility] skills in order to improve LUE functioning during ADLs, IADLs, and tranfers.   OT Occupational Profile and History Problem Focused Assessment - Including review of records relating to presenting  problem    Occupational performance deficits (Please refer to evaluation for details): ADL's    Body Structure / Function / Physical Skills UE functional use;Coordination;FMC;Dexterity;Strength;ROM    Cognitive Skills Attention;Memory;Emotional;Problem Solve;Safety Awareness    Rehab Potential Good    Clinical Decision Making Several treatment options, min-mod task modification necessary    Comorbidities Affecting Occupational Performance: Presence of comorbidities impacting occupational performance    Comorbidities impacting occupational performance description: Phyical, cognitive, visual,  medical comorbidities    Modification or Assistance to Complete Evaluation  No modification of tasks or assist necessary to complete eval    OT Frequency 2x / week    OT Duration  12 weeks    OT Treatment/Interventions Self-care/ADL training;DME and/or AE instruction;Therapeutic exercise;Therapeutic activities;Moist Heat;Cognitive remediation/compensation;Neuromuscular education;Visual/perceptual remediation/compensation;Coping strategies training;Patient/family education;Passive range of motion;Psychosocial skills training;Energy conservation;Functional Mobility Training    Consulted and Agree with Plan of Care Patient;Family member/caregiver    Family Member Consulted daughter present           Patient will benefit from skilled therapeutic intervention in order to improve the following deficits and impairments:   Body Structure / Function / Physical Skills: UE functional use, Coordination, FMC, Dexterity, Strength, ROM Cognitive Skills: Attention, Memory, Emotional, Problem Solve, Safety Awareness     Visit Diagnosis: Muscle weakness (generalized)  Other lack of coordination    Problem List Patient Active Problem List   Diagnosis Date Noted  . Sepsis ([place]) 10/18/2018    Harrel Carina, MS, OTR/L 10/06/2019, 2:06 PM  [place] MAIN [facility] SERVICES [address] [place], [place], [address] Phone: [phone]   Fax:  [phone]  Name: Kenneth Summers MRN: [id number] Date of Birth: 05/03/1945

## 2019-10-08 ENCOUNTER — Ambulatory Visit: Payer: Medicare Other | Admitting: Physical Therapy

## 2019-10-09 ENCOUNTER — Encounter: Payer: Self-pay | Admitting: Occupational Therapy

## 2019-10-09 ENCOUNTER — Ambulatory Visit: Payer: Medicare Other | Admitting: Occupational Therapy

## 2019-10-09 ENCOUNTER — Encounter: Payer: Self-pay | Admitting: Physical Therapy

## 2019-10-09 ENCOUNTER — Ambulatory Visit: Payer: Medicare Other | Attending: Family Medicine | Admitting: Physical Therapy

## 2019-10-09 ENCOUNTER — Other Ambulatory Visit: Payer: Self-pay

## 2019-10-09 DIAGNOSIS — M6281 Muscle weakness (generalized): Secondary | ICD-10-CM

## 2019-10-09 DIAGNOSIS — R296 Repeated falls: Secondary | ICD-10-CM | POA: Diagnosis present

## 2019-10-09 DIAGNOSIS — R278 Other lack of coordination: Secondary | ICD-10-CM | POA: Diagnosis present

## 2019-10-09 DIAGNOSIS — I69354 Hemiplegia and hemiparesis following cerebral infarction affecting left non-dominant side: Secondary | ICD-10-CM | POA: Diagnosis present

## 2019-10-09 DIAGNOSIS — R262 Difficulty in walking, not elsewhere classified: Secondary | ICD-10-CM | POA: Diagnosis present

## 2019-10-09 NOTE — Therapy (Signed)
[place] MAIN [facility] SERVICES [address] [place], [place], [address] Phone: [phone]   Fax:  [phone]  Physical Therapy Treatment  Patient Details  Name: Kenneth Summers MRN: [id number] Date of Birth: October 12, 1945 No data recorded  Encounter Date: 10/09/2019   PT End of Session - 10/09/19 1408    Visit Number [id number]    Number of Visits 216    Date for PT Re-Evaluation 11/12/19    Authorization Type Medicare reporting period starting 09/11/18; NO FOTO    Authorization Time Period goals last assessed 08/20/19    PT Start Time 1346    PT Stop Time 1430    PT Time Calculation (min) 44 min    Equipment Utilized During Treatment Gait belt    Activity Tolerance Patient tolerated treatment well;No increased pain    Behavior During Therapy WFL for tasks assessed/performed           Past Medical History:  Diagnosis Date  . Actinic keratosis   . Alcohol abuse   . APS (antiphospholipid syndrome) ([place])   . Basal cell carcinoma   . BPH (benign prostatic hyperplasia)   . CAD (coronary artery disease)   . Cellulitis   . Cervical spinal stenosis   . Chronic osteomyelitis ([place])   . CKD (chronic kidney disease)   . CKD (chronic kidney disease)   . Clostridium difficile diarrhea   . Depression   . Diplopia   . Fatigue   . GERD (gastroesophageal reflux disease)   . Hyperlipemia   . Hypertension   . Hypovitaminosis D   . IBS (irritable bowel syndrome)   . 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 Insomnia   . Left lumbosacral radiculopathy   . Moderate obstructive sleep apnea   . Osteomyelitis of foot ([place])   . PAD (peripheral artery disease) ([place])   . Pruritus   . RA (rheumatoid arthritis) ([place])   . Radiculopathy   . Restless legs syndrome   . RLS (restless legs syndrome)   . SLE (systemic lupus erythematosus related syndrome) ([place])   . 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 Stroke ([place])   . Toxic maculopathy of  both eyes     Past Surgical History:  Procedure Laterality Date  . CARDIAC CATHETERIZATION    . CAROTID ENDARTERECTOMY    . CERVICAL LAMINECTOMY    . CORONARY ANGIOPLASTY    . FRACTURE SURGERY    . fusion C5-6-7    . HERNIA REPAIR    . KNEE ARTHROSCOPY    . TONSILLECTOMY      There were no vitals filed for this visit.   Subjective Assessment - 10/09/19 1407    Subjective Patient reports doing well; no pain and no dizziness today; Reports adherence with HEP;    Patient is accompained by: Family member    Pertinent History Patient is a 74 y.o. male who presents to outpatient physical therapy with a referral for medical diagnosis of CVA. This patient's chief complaints consist of left hemiplegia and overall deconditioning leading to the following functional deficits: dependent for ADLs, IADLs, unable to transfer to car, dependent transfers at home with hoyer lift, unable to walk or stand without significant assistance, difficulty with W/C navigation..    Limitations Sitting;Lifting;Standing;House hold activities;Writing;Walking;Other (comment)    Diagnostic tests Brain MRI 11/01/2017: IMPRESSION: 1. Acute right ACA territory infarct as demonstrated on prior CT imaging. No intracranial hemorrhage or significant mass effect.  2. 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    Patient Stated Goals wants to be able to walk again    Currently in Pain? No/denies    Pain Onset Yesterday    Pain Onset Today                TREATMENT: Prior to transfersand in between transfers: Patient sittingin wheelchair: -LLE only LAQ2x10 -hip flexion march x10 reps each LE -arms in lap, resting UE, erect posture, deep breaths 2x5 reps;  Required min VCs and visual cues for better AROM to increase strengthening;  Ptrequires verbal and visual cues to improve ROM;He was able to exhibit improved LLE AROM with cues; Patient also requires cues for erect posture  for better trunk control;Patient exhibits improved neutral position this session with less lateral trunk lean;   Following exercise: Transferred sit<>Stand in parallel bars (seat adjusted to27inch from floor height) X 8 repwith CGA to close supervision;herequiredmodVCs for forward weight shift for better trunk control and transfer ability; Patient able to exhibit better transfer ability with increased forward weight shiftwith less posterior loss of balance.   Patient required min Ato CGA for standing posture. He was able to exhibit less posterior lean but does require cues for increased hip/knee extension for better weight bearing in BLE in standing;  While standing: -mini squatx10 reps with min A for safety; - side/side weight shift x10 reps with min A and cues for erect posture including increased knee extension;  -sit to stand x5 reps with min A  -RLE hip flexion march x5, x15 reps with min A and therapist blocking LLE knee; -unsupported standing  x10 sec with therapist min A for safety;   Patient requiredmodA to scoot back in chair at end of session due to increased fatigue    Response to treatment:Patient tolerated session well.He was able to exhibit better weight shift with increased hip extension with less posterior lean. He also exhibits increased knee extension in stance especially on LLE which allowed better weight bearing in LLE;  Patient does report increased fatigue at end of session but denies any increase in pain; Reinforced HEP;                        PT Education - 10/09/19 1408    Education Details transfers/positioning, LE strengthening, HEP    Person(s) Educated Patient;Child(ren)    Methods Explanation;Verbal cues    Comprehension Verbalized understanding;Returned demonstration;Verbal cues required;Need further instruction            PT Short Term Goals - 08/18/19 1305      PT SHORT TERM GOAL #1   Title Be  independent with initial home exercise program for self-management of symptoms.    Baseline HEP to be given at second session; advice to practice unsupported sitting at home (06/19/2018); 07/08/18: Performing at home, 6/29: needs assistance but is doing exercise program regularly; 08/22/18: adherent;    Time 2    Period Weeks    Status Achieved    Target Date 06/26/18             PT Long Term Goals - 10/02/19 1524      PT LONG TERM GOAL #1   Title Patient will complete all bed mobility with min A to improve functional independence for getting in and out of bed and adjusting in bed.     Baseline max A - total A reported by family (06/19/2018); 07/08/18: still  requires maxA, dtr reports improvement in rolling since starting therapy; 6/23: min A to supervision in clinic; not doing at home right; 8/5: Pt's daughter states that his rolling has improved, but still has difficulty with all other bed mobility; 10/23/18: pts daughter states that she is doing about 35% of the work if he is in proper position, he does uses the bed rails to help roll;  using Harrel Lemon for coming to sit EOB; 10/12: pts daughter states that she is doing about 30% of the work if he is in proper position, he does uses the bed rails to help roll, but it is improving;  using Hoyer for coming to sit EOB, 10/29: min A for sit to sidelying, rolling supervision, able to transition sidelying to sit with HHA;    Time 12    Period Weeks    Status Achieved    Target Date 12/04/18      PT LONG TERM GOAL #2   Title Patient will complete sit <> stand transfer chair to chair with Min A and LRAD to improve functional independence for household and community mobility.     Baseline supervision for STS, still requires elevated surface height to 27"    Time 12    Period Weeks    Status Achieved    Target Date 05/23/19      PT LONG TERM GOAL #3   Title Patient will navigate power w/c with min A x 100 feet to improve mobility for household and short  community distances.    Baseline required total A (06/12/2018); able to roll 20 feet with min A (06/20/2018); 07/08/18: Pt can go approximately 10' without assist per family;  10/23/2018: pt's daughter states that his power WC mobility has become 100% better with the new hand control, slowed speed, and with practice; 10/12: pt's daughter reports that he can perform household WC mobility and limited community ambulation with minA    Time 12    Period Weeks    Status Achieved    Target Date 05/23/19      PT LONG TERM GOAL #4   Title Patient will complete car transfers W/C to family SUV with min A using LRAD to improve his abilty to participate in community activities.     Baseline unable to complete car transfers at all (06/12/2018); unable to complete car transfers at all (06/19/2018).; 07/08/18: Unable to perform at this time; 09/11/18: unable to perform at this time; 10/23/2018: unable to complete car transfers at all; 10/12:  unable to complete car transfers at all, 10/29: unable; 01/13/19: unable; 2/11: min-mod A +2, 3/10: min A+1    Time 12    Period Weeks    Status Achieved    Target Date 05/23/19      PT LONG TERM GOAL #5   Title Patient will ambulate at least 10 feet with min A using LRAD to improve mobility for household and community distances.    Baseline unable to take steps (06/12/2018); able to shift R leg forward and back with max A and RW at edge of plinth (06/19/2018); 07/08/18: unable to ambulate at this time. 09/11/18: unable to perform at this time; 10/23/2018: unable to perform at this time; 10/12: unable to perform at this time, 10/29: unable at this time; 01/13/19: unable at this time, 2/11: min A to step with RLE, unable to step with LLE, 3/10: able to take 2 steps in parallel bars with min A for safety; 4/1: min A for walking 10  feet in parallel bars, 5/24: min A walking 10 feet in parallel bars; 6/21: no change 7/14: no change    Time 12    Period Weeks    Status Partially Met    Target Date  11/12/19      PT LONG TERM GOAL #6   Title Patient will be able to safety navigate up/down ramp with power chair, exhibiting good safety awareness, mod I to safely enter/exit his home.    Baseline 2/11: supervision with intermittent min A;4/1: supervision; 5/24: supervision    Time 12    Period Weeks    Status Partially Met    Target Date 11/12/19      PT LONG TERM GOAL #7   Title Patient will increase functional reach test to >15 inches in sitting to exhibit improved sitting balance and positioning and reduce fall risk;    Baseline 07/30/18: 12 inches; 09/11/18: 10-12 inches; 10/23/18: 12-13 inch; 10/12: 14.5", 10/29: 15.5 inch    Time 4    Period Weeks    Status Achieved    Target Date 11/12/19      PT LONG TERM GOAL #8   Title Patient will increase LLE quad strength to 3+/5 to improve functional strength for standing and mobility;    Baseline 10/28: 2+/5; 01/13/19: 2+/5, 2/11: 3-/5, 04/16/19: 3-/5, 05/08/19: 3-/5, 5/24: 3-/5, 6/21: 3-/5, 7/14: 3-/5    Time 12    Period Weeks    Status Partially Met    Target Date 11/12/19      PT LONG TERM GOAL  #9   TITLE Patient will report no vertigo with provoking motions or positions in order to be able to perform ADLs and mobility tasks without symptoms.    Baseline 06/30/19: reports no dizziness in last week; 6/21: one episode this weekend; 7/14: experiencing one a week;    Time 12    Period Weeks    Status Partially Met    Target Date 11/12/19                 Plan - 10/09/19 1530    Clinical Impression Statement Patient motivated and participated well within session. He was instructed in advanced LE strengthening. with increased repetition, patient able to exhibit improved AROM on LLE. By end of session he was able to remove LLE off foot plate without assistance. Patient instructed in standing activities and transfers. In standing, patient exhibits improved LLE knee extension this session with better balance/weight bearing in LLE. He  does continue to fatigue with prolonged standing requiring short seated rest breaks. He was able to progress transfers to close supervision and stand with supervision/CGA. Patient would benefit from additional skilled PT Intervention to improve strength, balance and mobility;    Personal Factors and Comorbidities Age;Comorbidity 3+    Comorbidities Relevant past medical history and comorbidities include long term steroid use for lupus, CKD, chronic osteomyelitis, cervical spine stenosis, BPH, APS, alcohol abuse, peripheral artery disease, depression, GERD, obstructive sleep apnea, HTN, IBS, IgA deficiency, lumbar radiculopathy, RA, Stroke, toxic maculopathy in both eyes, systemic lupus erythematosus related syndrome, cardiac catheterization, carotid endarterectomy, cervical laminectomy, coronary angioplasty, Fusion C5-C7, knee arthroplasty, lumbar surgery.    Examination-Activity Limitations Bed Mobility;Bend;Sit;Toileting;Stand;Stairs;Lift;Transfers;Squat;Locomotion Level;Carry;Dressing;Hygiene/Grooming;Continence    Examination-Participation Restrictions Yard Work;Interpersonal Relationship;Community Activity    Stability/Clinical Decision Making Evolving/Moderate complexity    Rehab Potential Fair    PT Frequency 3x / week    PT Duration 12 weeks    PT Treatment/Interventions ADLs/Self Care Home Management;Electrical Stimulation;Moist  Heat;Cryotherapy;Gait training;Stair training;Functional mobility training;Therapeutic exercise;Balance training;Neuromuscular re-education;Cognitive remediation;Patient/family education;Orthotic Fit/Training;Wheelchair mobility training;Manual techniques;Passive range of motion;Energy conservation;Joint Manipulations;Canalith Repostioning;Therapeutic activities;Vestibular    PT Next Visit Plan Work on strength, sitting/standing balance, functional activities such as rolling, bed mobility, transfers; caregiver training    PT Home Exercise Plan no updates this session     Consulted and Agree with Plan of Care Patient;Family member/caregiver    Family Member Consulted Wife           Patient will benefit from skilled therapeutic intervention in order to improve the following deficits and impairments:  Abnormal gait, Decreased activity tolerance, Decreased cognition, Decreased endurance, Decreased knowledge of use of DME, Decreased range of motion, Decreased skin integrity, Decreased strength, Impaired perceived functional ability, Impaired sensation, Impaired UE functional use, Improper body mechanics, Pain, Cardiopulmonary status limiting activity, Decreased balance, Decreased coordination, Decreased mobility, Difficulty walking, Impaired tone, Postural dysfunction, Dizziness  Visit Diagnosis: Muscle weakness (generalized)  Other lack of coordination  Difficulty in walking, not elsewhere classified     Problem List Patient Active Problem List   Diagnosis Date Noted  . Sepsis ([place]) 10/18/2018    Zariya Minner PT, DPT 10/09/2019, 3:32 PM  [place] MAIN [facility] SERVICES [address] [place], [place], [address] Phone: [phone]   Fax:  [phone]  Name: AMILIO ZEHNDER MRN: [id number] Date of Birth: 08-16-1945

## 2019-10-09 NOTE — Therapy (Signed)
[place] MAIN [facility] SERVICES [address] [place], [place], [address] Phone: [phone]   Fax:  [phone]  Occupational Therapy Treatment  Patient Details  Name: Kenneth Summers MRN: [id number] Date of Birth: [birth year] No data recorded  Encounter Date: 10/09/2019   OT End of Session - 10/09/19 1315    Visit Number 137    Number of Visits 157    Date for OT Re-Evaluation 12/15/19    Authorization Type Progress report period starting 07/21/2019    OT Start Time 1300    OT Stop Time 1345    OT Time Calculation (min) 45 min    Activity Tolerance Patient tolerated treatment well    Behavior During Therapy [facility] for tasks assessed/performed           Past Medical History:  Diagnosis Date  . Actinic keratosis   . Alcohol abuse   . APS (antiphospholipid syndrome) ([place])   . Basal cell carcinoma   . BPH (benign prostatic hyperplasia)   . CAD (coronary artery disease)   . Cellulitis   . Cervical spinal stenosis   . Chronic osteomyelitis ([place])   . CKD (chronic kidney disease)   . CKD (chronic kidney disease)   . Clostridium difficile diarrhea   . Depression   . Diplopia   . Fatigue   . GERD (gastroesophageal reflux disease)   . Hyperlipemia   . Hypertension   . Hypovitaminosis D   . IBS (irritable bowel syndrome)   . 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 Insomnia   . Left lumbosacral radiculopathy   . Moderate obstructive sleep apnea   . Osteomyelitis of foot ([place])   . PAD (peripheral artery disease) ([place])   . Pruritus   . RA (rheumatoid arthritis) ([place])   . Radiculopathy   . Restless legs syndrome   . RLS (restless legs syndrome)   . SLE (systemic lupus erythematosus related syndrome) ([place])   . 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 Stroke ([place])   . Toxic maculopathy of both eyes     Past Surgical History:  Procedure Laterality Date  . CARDIAC CATHETERIZATION    . CAROTID ENDARTERECTOMY    .  CERVICAL LAMINECTOMY    . CORONARY ANGIOPLASTY    . FRACTURE SURGERY    . fusion C5-6-7    . HERNIA REPAIR    . KNEE ARTHROSCOPY    . TONSILLECTOMY      There were no vitals filed for this visit.   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    Pertinent History Pt. is a 74 y.o. 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 Pt. PMHx includes: Multiple Falls, Lupus, DJD, Renal Abscess, CVA. Pt. resides with his wife. Pt.'s wife and daughter assist with ADLs. Pt. has caregivers in  for 2 hours a day, 6 days a week. Pt. received Rehab services in acute care, at SNF for STR, and [place] services. Pt. is retired from [employer] for [employer] and [employer].    Patient Stated Goals To regain use of his left UE, and do more for himself.    Currently in Pain? No/denies          OT TREATMENT   Therapeutic Exercise:  Pt. performed 1.5# dowel ex. For UE strengthening secondary to weakness. Bilateral shoulder flexion, chest  press, circular patterns, and elbow flexion/extension were performed for 1 set 10-20 reps each. Pt. performed BUE therapeutic ex with red theraband. Pt. performed bilateral shoulder flexion, abduction, horizontal abduction, diagonal abduction, elbow flexion, and extension for 1-2 sets 10- 20 reps each.  Pt. reports that he woke up with pain this morning in his left wrist, and digits. Pt. Reports that it started at 5 am, he was able to put heat on it at 6 am, which he reports helped. Pt. Reports no pain currently, or during therapy today. Pt. tolerated BUE exercises well, with no reports of discomfort. Pt. required consistent verbal, visual, and tactile cues to perform UE strengthening exercises. Pt. continues to work on improving BUE strength, and [facility] skills in order to improve LUE functioning during ADLs, IADLs, and tranfers.                        OT Education  - 10/09/19 1315    Education Details BUE Therapeutic ex    Person(s) Educated Patient    Methods Explanation;Demonstration    Comprehension Returned demonstration;Tactile cues required;Verbalized understanding;Need further instruction               OT Long Term Goals - 09/22/19 1400      OT LONG TERM GOAL #1   Title Pt. will increase left shoulder flexion AROM by 10 degrees to access cabinet/shelf.    Baseline Pt. continues to progress with shoulder ROM. Marland KitchenShouler flexion 130, and continues to work on reaching with increasing emphasis on flexing trunk to improve functional reach.  07/21/19- shoulder flexion to 130 with effort    Time 12    Period Weeks    Status Partially Met    Target Date 12/15/19      OT LONG TERM GOAL #2   Title Pt. will donn a shirt with Supervision.    Baseline Pt. continues to require MinA donning a zip down jacket with consistent cues to initiate the correct direction of the jacket.  07/21/19 continues to require min assist at times for shirt/jacket    Time 12    Period Weeks    Status On-going    Target Date 12/15/19      OT LONG TERM GOAL #3   Title Pt. will require ModA to perform LE dressing    Baseline MaxA left, ModA with the right. Pt. has sores on his ankle, and feet. Pt.'s daughter is assisting.    Time 12    Period Weeks    Status Deferred      OT LONG TERM GOAL #4   Title Pt. will improve left grip strength by 10# to be able to open a jar/container.    Baseline Pt. conitnues to present with limited left hand grip strength. Pt. continues to work towards opening containers, and bottles.    Time 12    Period Weeks    Status On-going    Target Date 12/15/19      OT LONG TERM GOAL #5   Title Pt. will improve left hand [facility] skills to be able to assist with buttoning/zipping.    Baseline Pt. is improving with manipulating the zipper on his jacket, however continues to require practice. Pt. continues to work on improving bilateral [facility] skills for  buttoning/zipping.  07/21/19 improving with buttons and zippers.    Time 12    Period Weeks    Status On-going    Target Date 12/15/19  OT LONG TERM GOAL #6   Title Pt. will demonstrate visual conmpensatory strategies for 100% of the time during ADLs    Baseline Pt.continues to rerequire intermitten cues cues for left sides awareness.    Time 12    Period Weeks    Status Partially Met    Target Date 12/15/19      OT LONG TERM GOAL #7   Title Pt. will prepare a simple cold snack from w/c with supervision using cognitive compensatory strategies 100% of the time.    Baseline Pt. has difficulty with getting the w/c close enough to access the refrigerator    Time 12    Period Weeks    Status Deferred      OT LONG TERM GOAL #8   Title Pt. will accurately identify potential safety hazard using good safety awareness, and judgement 100% for ADLs, and IADLs.    Baseline Pt. requires cues    Time 12    Period Weeks    Status On-going    Target Date 12/15/19      OT LONG TERM GOAL  #9   TITLE Pt. will  navigate his w/c around obstacles with Supervision and 100% accuracy    Baseline Pt. continues to require cuing for obstacles on the left, however requires less cues overall. Pt. requires significant cues to engage his left UE during tasks. 07/21/2019 continues to improve with decreased assist at times    Time 12    Period Weeks    Status On-going    Target Date 12/15/19      OT LONG TERM GOAL  #10   TITLE Pt. will independently be able  to reach to place items into cabinetry, and closets.    Baseline Pt. BUE strength, and reaching  is limited.    Time 12    Period Weeks    Status New    Target Date 12/15/19      OT LONG TERM GOAL  #11   TITLE Pt. will increase BUE strength by 31m grades in preparation for ADL transfers.    Baseline Pt. continues to work towards improving overall strength,    Time 12    Period Weeks    Status On-going    Target Date 12/15/19      OT LONG TERM  GOAL  #12   TITLE Pt. will improve LUE functional reaching with the left engaging the trunk 100% of the time during ADL tasks with minimal cues.    Baseline 07/21/2019: Pt. is improving with LUE functional reaching. Pt. conitnues to require cues to engage his trunk for extended reaching.    Time 12    Period Weeks    Status On-going    Target Date 12/15/19                 Plan - 10/09/19 1316    Clinical Impression Statement Pt. reports that he woke up with pain this morning in his left wrist, and digits. Pt. Reports that it started at 5 am, he was able to put heat on it at 6 am, which he reports helped. Pt. Reports no pain currently, or during therapy today. Pt. tolerated BUE exercises well, with no reports of discomfort. Pt. required consistent verbal, visual, and tactile cues to perform UE strengthening exercises. Pt. continues to work on improving BUE strength, and [facility] in order to improve LUE functioning during ADLs, IADLs, and tranfers.   OT Occupational Profile and History Problem Focused Assessment -  Including review of records relating to presenting problem    Occupational performance deficits (Please refer to evaluation for details): ADL's    Body Structure / Function / Physical Skills UE functional use;Coordination;FMC;Dexterity;Strength;ROM    Cognitive Skills Attention;Memory;Emotional;Problem Solve;Safety Awareness    Rehab Potential Good    Clinical Decision Making Several treatment options, min-mod task modification necessary    Comorbidities Affecting Occupational Performance: Presence of comorbidities impacting occupational performance    Comorbidities impacting occupational performance description: Phyical, cognitive, visual,  medical comorbidities    Modification or Assistance to Complete Evaluation  No modification of tasks or assist necessary to complete eval    OT Frequency 2x / week    OT Duration 12 weeks    OT Treatment/Interventions Self-care/ADL  training;DME and/or AE instruction;Therapeutic exercise;Therapeutic activities;Moist Heat;Cognitive remediation/compensation;Neuromuscular education;Visual/perceptual remediation/compensation;Coping strategies training;Patient/family education;Passive range of motion;Psychosocial skills training;Energy conservation;Functional Mobility Training    Recommended Other Services PT    Consulted and Agree with Plan of Care Patient;Family member/caregiver    Family Member Consulted daughter           Patient will benefit from skilled therapeutic intervention in order to improve the following deficits and impairments:   Body Structure / Function / Physical Skills: UE functional use, Coordination, FMC, Dexterity, Strength, ROM Cognitive Skills: Attention, Memory, Emotional, Problem Solve, Safety Awareness     Visit Diagnosis: Muscle weakness (generalized)  Other lack of coordination    Problem List Patient Active Problem List   Diagnosis Date Noted  . Sepsis ([place]) 10/18/2018    Harrel Carina, MS, OTR/L 10/09/2019, 1:55 PM  [place] MAIN [facility] SERVICES [address] [place], [place], [address] Phone: [phone]   Fax:  [phone]  Name: Kenneth Summers MRN: [id number] Date of Birth: February 04, 1946

## 2019-10-15 ENCOUNTER — Ambulatory Visit: Payer: Medicare Other | Admitting: Physical Therapy

## 2019-10-16 ENCOUNTER — Ambulatory Visit: Payer: Medicare Other | Admitting: Physical Therapy

## 2019-10-16 ENCOUNTER — Encounter: Payer: Self-pay | Admitting: Physical Therapy

## 2019-10-16 ENCOUNTER — Ambulatory Visit: Payer: Medicare Other | Admitting: Occupational Therapy

## 2019-10-16 ENCOUNTER — Other Ambulatory Visit: Payer: Self-pay

## 2019-10-16 ENCOUNTER — Encounter: Payer: Self-pay | Admitting: Occupational Therapy

## 2019-10-16 DIAGNOSIS — M6281 Muscle weakness (generalized): Secondary | ICD-10-CM | POA: Diagnosis not present

## 2019-10-16 DIAGNOSIS — R262 Difficulty in walking, not elsewhere classified: Secondary | ICD-10-CM

## 2019-10-16 DIAGNOSIS — R278 Other lack of coordination: Secondary | ICD-10-CM

## 2019-10-16 NOTE — Therapy (Signed)
[place] MAIN [facility] SERVICES [address] [place], [place], [address]  Phone: [phone]   Fax:  [phone]  Occupational Therapy Treatment  Patient Details  Name: Kenneth Summers MRN: [id number] Date of Birth: November 21, 1945 No data recorded  Encounter Date: 10/16/2019   OT End of Session - 10/16/19 1402    Visit Number 138    Number of Visits 157    Date for OT Re-Evaluation 12/15/19    Authorization Type Progress report period starting 07/21/2019    OT Start Time 1350    OT Stop Time 1430    OT Time Calculation (min) 40 min    Activity Tolerance Patient tolerated treatment well    Behavior During Therapy [facility] for tasks assessed/performed           Past Medical History:  Diagnosis Date  . Actinic keratosis   . Alcohol abuse   . APS (antiphospholipid syndrome) ([place])   . Basal cell carcinoma   . BPH (benign prostatic hyperplasia)   . CAD (coronary artery disease)   . Cellulitis   . Cervical spinal stenosis   . Chronic osteomyelitis ([place])   . CKD (chronic kidney disease)   . CKD (chronic kidney disease)   . Clostridium difficile diarrhea   . Depression   . Diplopia   . Fatigue   . GERD (gastroesophageal reflux disease)   . Hyperlipemia   . Hypertension   . Hypovitaminosis D   . IBS (irritable bowel syndrome)   . 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 Insomnia   . Left lumbosacral radiculopathy   . Moderate obstructive sleep apnea   . Osteomyelitis of foot ([place])   . PAD (peripheral artery disease) ([place])   . Pruritus   . RA (rheumatoid arthritis) ([place])   . Radiculopathy   . Restless legs syndrome   . RLS (restless legs syndrome)   . SLE (systemic lupus erythematosus related syndrome) ([place])   . 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 Stroke ([place])   . Toxic maculopathy of both eyes     Past Surgical History:  Procedure Laterality Date  . CARDIAC CATHETERIZATION    . CAROTID ENDARTERECTOMY    .  CERVICAL LAMINECTOMY    . CORONARY ANGIOPLASTY    . FRACTURE SURGERY    . fusion C5-6-7    . HERNIA REPAIR    . KNEE ARTHROSCOPY    . TONSILLECTOMY      There were no vitals filed for this visit.   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    Patient is accompanied by: Family member    Pertinent History Pt. is a 74 y.o. 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 Pt. PMHx includes: Multiple Falls, Lupus, DJD, Renal Abscess, CVA. Pt. resides with his wife. Pt.'s wife and daughter assist with ADLs. Pt. has caregivers in  for 2 hours a day, 6 days a week. Pt. received Rehab services in acute care, at SNF for STR, and [place] services. Pt. is retired from [employer] for [employer] and [employer].    Currently in Pain? No/denies           OT TREATMENT   Therapeutic Exercise:  Pt. performed BUE ther. ex with 2# dowel ex. For UE strengthening secondary to weakness. Bilateral shoulder flexion, chest  press, circular patterns, and elbow flexion/extension were performedfor 1 set 10-20 reps each.Pt.performed BUE therapeutic ex withredtheraband. Pt. performed bilateral shoulder flexion, abduction, horizontal abduction, diagonal abduction, elbow flexion, and extensionfor 1-2 sets 10- 20 reps each.  Pt. reports that his left UE, and hand feel better overall today. Pt. reports no pain during therapy today. Pt. tolerated BUE exercises well, with no reportsofdiscomfort. Pt. required consistent verbal, visual, and tactile cues to perform UE strengthening exercises, technique, and form. Pt. continues to work on improving BUE strength, and [facility] skills in order to improve LUE functioning during ADLs, IADLs, and tranfers.                            OT Education - 10/16/19 1402    Education Details BUE Therapeutic ex    Person(s) Educated Patient    Methods  Explanation;Demonstration    Comprehension Returned demonstration;Tactile cues required;Verbalized understanding;Need further instruction               OT Long Term Goals - 09/22/19 1400      OT LONG TERM GOAL #1   Title Pt. will increase left shoulder flexion AROM by 10 degrees to access cabinet/shelf.    Baseline Pt. continues to progress with shoulder ROM. Marland KitchenShouler flexion 130, and continues to work on reaching with increasing emphasis on flexing trunk to improve functional reach.  07/21/19- shoulder flexion to 130 with effort    Time 12    Period Weeks    Status Partially Met    Target Date 12/15/19      OT LONG TERM GOAL #2   Title Pt. will donn a shirt with Supervision.    Baseline Pt. continues to require MinA donning a zip down jacket with consistent cues to initiate the correct direction of the jacket.  07/21/19 continues to require min assist at times for shirt/jacket    Time 12    Period Weeks    Status On-going    Target Date 12/15/19      OT LONG TERM GOAL #3   Title Pt. will require ModA to perform LE dressing    Baseline MaxA left, ModA with the right. Pt. has sores on his ankle, and feet. Pt.'s daughter is assisting.    Time 12    Period Weeks    Status Deferred      OT LONG TERM GOAL #4   Title Pt. will improve left grip strength by 10# to be able to open a jar/container.    Baseline Pt. conitnues to present with limited left hand grip strength. Pt. continues to work towards opening containers, and bottles.    Time 12    Period Weeks    Status On-going    Target Date 12/15/19      OT LONG TERM GOAL #5   Title Pt. will improve left hand [facility] skills to be able to assist with buttoning/zipping.    Baseline Pt. is improving with manipulating the zipper on his jacket, however continues to require practice. Pt. continues to work on improving bilateral [facility] skills for buttoning/zipping.  07/21/19 improving with buttons and zippers.    Time 12    Period Weeks     Status On-going    Target Date 12/15/19      OT LONG TERM GOAL #6   Title Pt. will demonstrate visual conmpensatory strategies for 100% of the time during ADLs    Baseline Pt.continues to rerequire intermitten  cues cues for left sides awareness.    Time 12    Period Weeks    Status Partially Met    Target Date 12/15/19      OT LONG TERM GOAL #7   Title Pt. will prepare a simple cold snack from w/c with supervision using cognitive compensatory strategies 100% of the time.    Baseline Pt. has difficulty with getting the w/c close enough to access the refrigerator    Time 12    Period Weeks    Status Deferred      OT LONG TERM GOAL #8   Title Pt. will accurately identify potential safety hazard using good safety awareness, and judgement 100% for ADLs, and IADLs.    Baseline Pt. requires cues    Time 12    Period Weeks    Status On-going    Target Date 12/15/19      OT LONG TERM GOAL  #9   TITLE Pt. will  navigate his w/c around obstacles with Supervision and 100% accuracy    Baseline Pt. continues to require cuing for obstacles on the left, however requires less cues overall. Pt. requires significant cues to engage his left UE during tasks. 07/21/2019 continues to improve with decreased assist at times    Time 12    Period Weeks    Status On-going    Target Date 12/15/19      OT LONG TERM GOAL  #10   TITLE Pt. will independently be able  to reach to place items into cabinetry, and closets.    Baseline Pt. BUE strength, and reaching  is limited.    Time 12    Period Weeks    Status New    Target Date 12/15/19      OT LONG TERM GOAL  #11   TITLE Pt. will increase BUE strength by 37m grades in preparation for ADL transfers.    Baseline Pt. continues to work towards improving overall strength,    Time 12    Period Weeks    Status On-going    Target Date 12/15/19      OT LONG TERM GOAL  #12   TITLE Pt. will improve LUE functional reaching with the left engaging the trunk  100% of the time during ADL tasks with minimal cues.    Baseline 07/21/2019: Pt. is improving with LUE functional reaching. Pt. conitnues to require cues to engage his trunk for extended reaching.    Time 12    Period Weeks    Status On-going    Target Date 12/15/19                 Plan - 10/16/19 1404    Clinical Impression Statement Pt. reports that his left UE, and hand feel better overall today. Pt. reports no pain during therapy today. Pt. tolerated BUE exercises well, with no reportsofdiscomfort. Pt. required consistent verbal, visual, and tactile cues to perform UE strengthening exercises, technique, and form. Pt. continues to work on improving BUE strength, and [facility] in order to improve LUE functioning during ADLs, IADLs, and tranfers.   OT Occupational Profile and History Problem Focused Assessment - Including review of records relating to presenting problem    Occupational performance deficits (Please refer to evaluation for details): ADL's    Body Structure / Function / Physical Skills UE functional use;Coordination;FMC;Dexterity;Strength;ROM    Cognitive Skills Attention;Memory;Emotional;Problem Solve;Safety Awareness    Rehab Potential Good    Clinical Decision Making Several treatment  options, min-mod task modification necessary    Comorbidities Affecting Occupational Performance: Presence of comorbidities impacting occupational performance    Modification or Assistance to Complete Evaluation  No modification of tasks or assist necessary to complete eval    OT Frequency 2x / week    OT Duration 12 weeks    OT Treatment/Interventions Self-care/ADL training;DME and/or AE instruction;Therapeutic exercise;Therapeutic activities;Moist Heat;Cognitive remediation/compensation;Neuromuscular education;Visual/perceptual remediation/compensation;Coping strategies training;Patient/family education;Passive range of motion;Psychosocial skills training;Energy conservation;Functional  Mobility Training    Consulted and Agree with Plan of Care Patient;Family member/caregiver           Patient will benefit from skilled therapeutic intervention in order to improve the following deficits and impairments:   Body Structure / Function / Physical Skills: UE functional use, Coordination, FMC, Dexterity, Strength, ROM Cognitive Skills: Attention, Memory, Emotional, Problem Solve, Safety Awareness     Visit Diagnosis: Muscle weakness (generalized)    Problem List Patient Active Problem List   Diagnosis Date Noted  . Sepsis ([place]) 10/18/2018    Harrel Carina, MS, OTR/L 10/16/2019, 2:43 PM  [place] MAIN [facility] SERVICES [address] [place], [place], [address] Phone: [phone]   Fax:  [phone]  Name: Kenneth Summers MRN: [id number] Date of Birth: January 16, 1946

## 2019-10-16 NOTE — Therapy (Signed)
[place] MAIN [facility] SERVICES [address] [place], [place], [address] Phone: [phone]   Fax:  [phone]  Physical Therapy Treatment  Patient Details  Name: Kenneth Summers MRN: [id number] Date of Birth: 09-07-1945 No data recorded  Encounter Date: 10/16/2019   PT End of Session - 10/16/19 1253    Visit Number [id number]    Number of Visits 216    Date for PT Re-Evaluation 11/12/19    Authorization Type Medicare reporting period starting 09/11/18; NO FOTO    Authorization Time Period goals last assessed 08/20/19    PT Start Time 1302    PT Stop Time 1345    PT Time Calculation (min) 43 min    Equipment Utilized During Treatment Gait belt    Activity Tolerance Patient tolerated treatment well;No increased pain    Behavior During Therapy WFL for tasks assessed/performed           Past Medical History:  Diagnosis Date  . Actinic keratosis   . Alcohol abuse   . APS (antiphospholipid syndrome) ([place])   . Basal cell carcinoma   . BPH (benign prostatic hyperplasia)   . CAD (coronary artery disease)   . Cellulitis   . Cervical spinal stenosis   . Chronic osteomyelitis ([place])   . CKD (chronic kidney disease)   . CKD (chronic kidney disease)   . Clostridium difficile diarrhea   . Depression   . Diplopia   . Fatigue   . GERD (gastroesophageal reflux disease)   . Hyperlipemia   . Hypertension   . Hypovitaminosis D   . IBS (irritable bowel syndrome)   . 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 Insomnia   . Left lumbosacral radiculopathy   . Moderate obstructive sleep apnea   . Osteomyelitis of foot ([place])   . PAD (peripheral artery disease) ([place])   . Pruritus   . RA (rheumatoid arthritis) ([place])   . Radiculopathy   . Restless legs syndrome   . RLS (restless legs syndrome)   . SLE (systemic lupus erythematosus related syndrome) ([place])   . 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 Stroke ([place])   . Toxic maculopathy of  both eyes     Past Surgical History:  Procedure Laterality Date  . CARDIAC CATHETERIZATION    . CAROTID ENDARTERECTOMY    . CERVICAL LAMINECTOMY    . CORONARY ANGIOPLASTY    . FRACTURE SURGERY    . fusion C5-6-7    . HERNIA REPAIR    . KNEE ARTHROSCOPY    . TONSILLECTOMY      There were no vitals filed for this visit.   Subjective Assessment - 10/16/19 1252    Subjective Patient reports doing well; no dizziness reported; Reports adherence with HEP; was able to stand over the weekend; He has gotten a new home aide to assist with exercise/ADLs;    Patient is accompained by: Family member    Pertinent History Patient is a 74 y.o. male who presents to outpatient physical therapy with a referral for medical diagnosis of CVA. This patient's chief complaints consist of left hemiplegia and overall deconditioning leading to the following functional deficits: dependent for ADLs, IADLs, unable to transfer to car, dependent transfers at home with hoyer lift, unable to walk or stand without significant assistance, difficulty with W/C navigation..    Limitations Sitting;Lifting;Standing;House hold activities;Writing;Walking;Other (comment)    Diagnostic tests Brain MRI 11/01/2017: IMPRESSION: 1. Acute right ACA  territory infarct as demonstrated on prior CT imaging. No intracranial hemorrhage or significant mass effect. 2. 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    Patient Stated Goals wants to be able to walk again    Currently in Pain? No/denies    Pain Onset Yesterday    Pain Onset Today                 TREATMENT: Prior to transfersand in between transfers: Patient sittingin wheelchair: -LLE only LAQ2x10 -hip flexion march x10reps each LE, x5 reps RLE only;  -forward weight shift tricep press x10 reps;  -arms in lap, resting UE, erect posture, deep breaths 2x5 reps; Required min VCs and visual cues for better AROM to increase  strengthening;  Ptrequires verbal and visual cues to improve ROM;He was able to exhibit improved LLE AROM with cues; Patient also requires cues for erect posture for better trunk control;Patient exhibits improved neutral position this session with less lateral trunk lean;   Following exercise: Transferred sit<>Stand in parallel bars (seat adjusted to27inch from floor height) X 6 repwith CGAto close supervision;herequiredmodVCs for forward weight shift for better trunk control and transfer ability; Patient able to exhibit better transfer ability with increased forward weight shiftwith less posterior loss of balance.   Patient required min Ato CGA for standing posture. He was able to exhibit less posterior lean but does require cues for increased hip/knee extension for better weight bearing in BLE in standing;  While standing: -mini squatx15 reps with min A for safety; -sit to stand x5 reps with min A  -RLE hip flexion march x10 reps, x5 reps with min A and therapist blocking LLE knee; Attempted LLE march x5 reps but patient had significant difficulty lifting foot;  -unsupported standing  x10 sec with therapist min A for safety;   Patient requiredmodA to scoot back in chair at end of session due to increased fatigue    Response to treatment:Patient tolerated session well.He was able to exhibit better weight shift with increased hip extension with less posterior lean. He also exhibits increased knee extension in stance especially on LLE which allowed better weight bearing in LLE;  Patient does report increased fatigue at end of session but denies any increase in pain; Reinforced HEP;                       PT Education - 10/16/19 1253    Education Details transfers/positioning, LE strengthening, HEP    Person(s) Educated Patient;Child(ren)    Methods Explanation;Verbal cues    Comprehension Verbalized understanding;Returned  demonstration;Verbal cues required;Need further instruction            PT Short Term Goals - 08/18/19 1305      PT SHORT TERM GOAL #1   Title Be independent with initial home exercise program for self-management of symptoms.    Baseline HEP to be given at second session; advice to practice unsupported sitting at home (06/19/2018); 07/08/18: Performing at home, 6/29: needs assistance but is doing exercise program regularly; 08/22/18: adherent;    Time 2    Period Weeks    Status Achieved    Target Date 06/26/18             PT Long Term Goals - 10/02/19 1524      PT LONG TERM GOAL #1   Title Patient will complete all bed mobility with min A to improve functional independence for getting in and out of bed  and adjusting in bed.     Baseline max A - total A reported by family (06/19/2018); 07/08/18: still requires maxA, dtr reports improvement in rolling since starting therapy; 6/23: min A to supervision in clinic; not doing at home right; 8/5: Pt's daughter states that his rolling has improved, but still has difficulty with all other bed mobility; 10/23/18: pts daughter states that she is doing about 35% of the work if he is in proper position, he does uses the bed rails to help roll;  using Hoyer for coming to sit EOB; 10/12: pts daughter states that she is doing about 30% of the work if he is in proper position, he does uses the bed rails to help roll, but it is improving;  using Hoyer for coming to sit EOB, 10/29: min A for sit to sidelying, rolling supervision, able to transition sidelying to sit with HHA;    Time 12    Period Weeks    Status Achieved    Target Date 12/04/18      PT LONG TERM GOAL #2   Title Patient will complete sit <> stand transfer chair to chair with Min A and LRAD to improve functional independence for household and community mobility.     Baseline supervision for STS, still requires elevated surface height to 27"    Time 12    Period Weeks    Status Achieved     Target Date 05/23/19      PT LONG TERM GOAL #3   Title Patient will navigate power w/c with min A x 100 feet to improve mobility for household and short community distances.    Baseline required total A (06/12/2018); able to roll 20 feet with min A (06/20/2018); 07/08/18: Pt can go approximately 10' without assist per family;  10/23/2018: pt's daughter states that his power WC mobility has become 100% better with the new hand control, slowed speed, and with practice; 10/12: pt's daughter reports that he can perform household WC mobility and limited community ambulation with minA    Time 12    Period Weeks    Status Achieved    Target Date 05/23/19      PT LONG TERM GOAL #4   Title Patient will complete car transfers W/C to family SUV with min A using LRAD to improve his abilty to participate in community activities.     Baseline unable to complete car transfers at all (06/12/2018); unable to complete car transfers at all (06/19/2018).; 07/08/18: Unable to perform at this time; 09/11/18: unable to perform at this time; 10/23/2018: unable to complete car transfers at all; 10/12:  unable to complete car transfers at all, 10/29: unable; 01/13/19: unable; 2/11: min-mod A +2, 3/10: min A+1    Time 12    Period Weeks    Status Achieved    Target Date 05/23/19      PT LONG TERM GOAL #5   Title Patient will ambulate at least 10 feet with min A using LRAD to improve mobility for household and community distances.    Baseline unable to take steps (06/12/2018); able to shift R leg forward and back with max A and RW at edge of plinth (06/19/2018); 07/08/18: unable to ambulate at this time. 09/11/18: unable to perform at this time; 10/23/2018: unable to perform at this time; 10/12: unable to perform at this time, 10/29: unable at this time; 01/13/19: unable at this time, 2/11: min A to step with RLE, unable to step with LLE,  3/10: able to take 2 steps in parallel bars with min A for safety; 4/1: min A for walking 10 feet in parallel  bars, 5/24: min A walking 10 feet in parallel bars; 6/21: no change 7/14: no change    Time 12    Period Weeks    Status Partially Met    Target Date 11/12/19      PT LONG TERM GOAL #6   Title Patient will be able to safety navigate up/down ramp with power chair, exhibiting good safety awareness, mod I to safely enter/exit his home.    Baseline 2/11: supervision with intermittent min A;4/1: supervision; 5/24: supervision    Time 12    Period Weeks    Status Partially Met    Target Date 11/12/19      PT LONG TERM GOAL #7   Title Patient will increase functional reach test to >15 inches in sitting to exhibit improved sitting balance and positioning and reduce fall risk;    Baseline 07/30/18: 12 inches; 09/11/18: 10-12 inches; 10/23/18: 12-13 inch; 10/12: 14.5", 10/29: 15.5 inch    Time 4    Period Weeks    Status Achieved    Target Date 11/12/19      PT LONG TERM GOAL #8   Title Patient will increase LLE quad strength to 3+/5 to improve functional strength for standing and mobility;    Baseline 10/28: 2+/5; 01/13/19: 2+/5, 2/11: 3-/5, 04/16/19: 3-/5, 05/08/19: 3-/5, 5/24: 3-/5, 6/21: 3-/5, 7/14: 3-/5    Time 12    Period Weeks    Status Partially Met    Target Date 11/12/19      PT LONG TERM GOAL  #9   TITLE Patient will report no vertigo with provoking motions or positions in order to be able to perform ADLs and mobility tasks without symptoms.    Baseline 06/30/19: reports no dizziness in last week; 6/21: one episode this weekend; 7/14: experiencing one a week;    Time 12    Period Weeks    Status Partially Met    Target Date 11/12/19                 Plan - 10/16/19 1320    Clinical Impression Statement Patient motivated and participated well within session. He has missed 1 week of PT due to holiday and transportation issues. Despite missing one week of therapy he was able to do well with sit<>Stand transfers and standing in parallel bars. He does fatigue quickly requiring  short seated rest break. Patient instructed in advanced LE strengthening with increased repetition. He does require min VCs and visual cues for increased AROM. He does have skin tear on left knee from home but denies any pain. He would benefit from additional skilled PT Intervention to improve strength, balance and mobility;    Personal Factors and Comorbidities Age;Comorbidity 3+    Comorbidities Relevant past medical history and comorbidities include long term steroid use for lupus, CKD, chronic osteomyelitis, cervical spine stenosis, BPH, APS, alcohol abuse, peripheral artery disease, depression, GERD, obstructive sleep apnea, HTN, IBS, IgA deficiency, lumbar radiculopathy, RA, Stroke, toxic maculopathy in both eyes, systemic lupus erythematosus related syndrome, cardiac catheterization, carotid endarterectomy, cervical laminectomy, coronary angioplasty, Fusion C5-C7, knee arthroplasty, lumbar surgery.    Examination-Activity Limitations Bed Mobility;Bend;Sit;Toileting;Stand;Stairs;Lift;Transfers;Squat;Locomotion Level;Carry;Dressing;Hygiene/Grooming;Continence    Examination-Participation Restrictions Yard Work;Interpersonal Relationship;Community Activity    Stability/Clinical Decision Making Evolving/Moderate complexity    Rehab Potential Fair    PT Frequency 3x / week  PT Duration 12 weeks    PT Treatment/Interventions ADLs/Self Care Home Management;Electrical Stimulation;Moist Heat;Cryotherapy;Gait training;Stair training;Functional mobility training;Therapeutic exercise;Balance training;Neuromuscular re-education;Cognitive remediation;Patient/family education;Orthotic Fit/Training;Wheelchair mobility training;Manual techniques;Passive range of motion;Energy conservation;Joint Manipulations;Canalith Repostioning;Therapeutic activities;Vestibular    PT Next Visit Plan Work on strength, sitting/standing balance, functional activities such as rolling, bed mobility, transfers; caregiver training     PT Home Exercise Plan no updates this session    Consulted and Agree with Plan of Care Patient;Family member/caregiver    Family Member Consulted Wife           Patient will benefit from skilled therapeutic intervention in order to improve the following deficits and impairments:  Abnormal gait, Decreased activity tolerance, Decreased cognition, Decreased endurance, Decreased knowledge of use of DME, Decreased range of motion, Decreased skin integrity, Decreased strength, Impaired perceived functional ability, Impaired sensation, Impaired UE functional use, Improper body mechanics, Pain, Cardiopulmonary status limiting activity, Decreased balance, Decreased coordination, Decreased mobility, Difficulty walking, Impaired tone, Postural dysfunction, Dizziness  Visit Diagnosis: Muscle weakness (generalized)  Other lack of coordination  Difficulty in walking, not elsewhere classified     Problem List Patient Active Problem List   Diagnosis Date Noted  . Sepsis ([place]) 10/18/2018    Chayil Gantt PT, DPT 10/16/2019, 1:23 PM  [place] [facility]IN [facility] SERVICES [address] [place], [place], [address] Phone: [phone]   Fax:  [phone]  Name: EARON RIVEST MRN: [id number] Date of Birth: 26-Aug-1945

## 2019-10-20 ENCOUNTER — Other Ambulatory Visit: Payer: Self-pay

## 2019-10-20 ENCOUNTER — Ambulatory Visit: Payer: Medicare Other | Admitting: Occupational Therapy

## 2019-10-20 ENCOUNTER — Ambulatory Visit: Payer: Medicare Other

## 2019-10-20 DIAGNOSIS — M6281 Muscle weakness (generalized): Secondary | ICD-10-CM

## 2019-10-20 DIAGNOSIS — R262 Difficulty in walking, not elsewhere classified: Secondary | ICD-10-CM

## 2019-10-20 DIAGNOSIS — R278 Other lack of coordination: Secondary | ICD-10-CM

## 2019-10-20 DIAGNOSIS — R296 Repeated falls: Secondary | ICD-10-CM

## 2019-10-20 DIAGNOSIS — I69354 Hemiplegia and hemiparesis following cerebral infarction affecting left non-dominant side: Secondary | ICD-10-CM

## 2019-10-20 NOTE — Therapy (Signed)
[place] MAIN [facility] SERVICES [address] [place], [place], [address] Phone: [phone]   Fax:  [phone]  Physical Therapy Treatment  Patient Details  Name: Kenneth Summers MRN: [id number] Date of Birth: [birth year] No data recorded  Encounter Date: 10/20/2019   PT End of Session - 10/20/19 1645    Visit Number [id number]    Number of Visits 216    Date for PT Re-Evaluation 11/12/19    PT Start Time 1300    PT Stop Time 1345    PT Time Calculation (min) 45 min    Equipment Utilized During Treatment Gait belt    Activity Tolerance Patient tolerated treatment well;No increased pain    Behavior During Therapy WFL for tasks assessed/performed           Past Medical History:  Diagnosis Date  . Actinic keratosis   . Alcohol abuse   . APS (antiphospholipid syndrome) ([place])   . Basal cell carcinoma   . BPH (benign prostatic hyperplasia)   . CAD (coronary artery disease)   . Cellulitis   . Cervical spinal stenosis   . Chronic osteomyelitis ([place])   . CKD (chronic kidney disease)   . CKD (chronic kidney disease)   . Clostridium difficile diarrhea   . Depression   . Diplopia   . Fatigue   . GERD (gastroesophageal reflux disease)   . Hyperlipemia   . Hypertension   . Hypovitaminosis D   . IBS (irritable bowel syndrome)   . 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 Insomnia   . Left lumbosacral radiculopathy   . Moderate obstructive sleep apnea   . Osteomyelitis of foot ([place])   . PAD (peripheral artery disease) ([place])   . Pruritus   . RA (rheumatoid arthritis) ([place])   . Radiculopathy   . Restless legs syndrome   . RLS (restless legs syndrome)   . SLE (systemic lupus erythematosus related syndrome) ([place])   . 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 Stroke ([place])   . Toxic maculopathy of both eyes     Past Surgical History:  Procedure Laterality Date  . CARDIAC CATHETERIZATION    . CAROTID ENDARTERECTOMY    .  CERVICAL LAMINECTOMY    . CORONARY ANGIOPLASTY    . FRACTURE SURGERY    . fusion C5-6-7    . HERNIA REPAIR    . KNEE ARTHROSCOPY    . TONSILLECTOMY      There were no vitals filed for this visit.   Subjective Assessment - 10/20/19 1642    Subjective Pt a little fatigued this date, reports high energy expenditure during dressing this date. Pt denies pain this date.    Pertinent History Patient is a 74 y.o. male who presents to outpatient physical therapy with a referral for medical diagnosis of CVA. This patient's chief complaints consist of left hemiplegia and overall deconditioning leading to the following functional deficits: dependent for ADLs, IADLs, unable to transfer to car, dependent transfers at home with hoyer lift, unable to walk or stand without significant assistance, difficulty with W/C navigation..    Diagnostic tests Brain MRI 11/01/2017: IMPRESSION: 1. Acute right ACA territory infarct as demonstrated on prior CT imaging. No intracranial hemorrhage or significant mass effect. 2. 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    Patient Stated Goals wants to be able to walk again    Currently in Pain? No/denies  INTERVENTION THIS DATE:  Prior to transfersand in between transfers: Patient sittingin wheelchair: -Bilat LAQ2x10 (AA/ROM on Left for full range)  -hip flexion march 2x10reps BLE, (AA/ROM on Left for full range)   -forward weight shift x8, modA at scapulae, pt pull self hands on knees   Required min VCs and visual cues for better AROM to increase strengthening;  Following exercise: Transferred sit<>Stand in parallel bars (seat adjusted to27inch from floor height, then 25.5 for last 2)  Everlean Cherry CGAto close supervision;herequiredmodVCs for forward weight shift for better trunk control and transfer ability; Patient able to exhibit better transfer ability with increased forward weight shiftwith less  posterior loss of balance.  FWD weight shifting to // bars, cues to avoid Left slump 1x10, minA   Patient required min Ato CGA for standing posture. He was able to exhibit less posterior lean but does require cues for increased hip/knee extension for better weight bearing in BLE in standing;   While standing: -mini squatx15 reps with min A for safety;  Patient requiredmodA to scoot back in chair at end of session due to increased fatigue     PT Short Term Goals - 08/18/19 1305      PT SHORT TERM GOAL #1   Title Be independent with initial home exercise program for self-management of symptoms.    Baseline HEP to be given at second session; advice to practice unsupported sitting at home (06/19/2018); 07/08/18: Performing at home, 6/29: needs assistance but is doing exercise program regularly; 08/22/18: adherent;    Time 2    Period Weeks    Status Achieved    Target Date 06/26/18             PT Long Term Goals - 10/02/19 1524      PT LONG TERM GOAL #1   Title Patient will complete all bed mobility with min A to improve functional independence for getting in and out of bed and adjusting in bed.     Baseline max A - total A reported by family (06/19/2018); 07/08/18: still requires maxA, dtr reports improvement in rolling since starting therapy; 6/23: min A to supervision in clinic; not doing at home right; 8/5: Pt's daughter states that his rolling has improved, but still has difficulty with all other bed mobility; 10/23/18: pts daughter states that she is doing about 35% of the work if he is in proper position, he does uses the bed rails to help roll;  using Hoyer for coming to sit EOB; 10/12: pts daughter states that she is doing about 30% of the work if he is in proper position, he does uses the bed rails to help roll, but it is improving;  using Hoyer for coming to sit EOB, 10/29: min A for sit to sidelying, rolling supervision, able to transition sidelying to sit with HHA;    Time  12    Period Weeks    Status Achieved    Target Date 12/04/18      PT LONG TERM GOAL #2   Title Patient will complete sit <> stand transfer chair to chair with Min A and LRAD to improve functional independence for household and community mobility.     Baseline supervision for STS, still requires elevated surface height to 27"    Time 12    Period Weeks    Status Achieved    Target Date 05/23/19      PT LONG TERM GOAL #3   Title Patient will navigate power w/c with  min A x 100 feet to improve mobility for household and short community distances.    Baseline required total A (06/12/2018); able to roll 20 feet with min A (06/20/2018); 07/08/18: Pt can go approximately 10' without assist per family;  10/23/2018: pt's daughter states that his power WC mobility has become 100% better with the new hand control, slowed speed, and with practice; 10/12: pt's daughter reports that he can perform household WC mobility and limited community ambulation with minA    Time 12    Period Weeks    Status Achieved    Target Date 05/23/19      PT LONG TERM GOAL #4   Title Patient will complete car transfers W/C to family SUV with min A using LRAD to improve his abilty to participate in community activities.     Baseline unable to complete car transfers at all (06/12/2018); unable to complete car transfers at all (06/19/2018).; 07/08/18: Unable to perform at this time; 09/11/18: unable to perform at this time; 10/23/2018: unable to complete car transfers at all; 10/12:  unable to complete car transfers at all, 10/29: unable; 01/13/19: unable; 2/11: min-mod A +2, 3/10: min A+1    Time 12    Period Weeks    Status Achieved    Target Date 05/23/19      PT LONG TERM GOAL #5   Title Patient will ambulate at least 10 feet with min A using LRAD to improve mobility for household and community distances.    Baseline unable to take steps (06/12/2018); able to shift R leg forward and back with max A and RW at edge of plinth  (06/19/2018); 07/08/18: unable to ambulate at this time. 09/11/18: unable to perform at this time; 10/23/2018: unable to perform at this time; 10/12: unable to perform at this time, 10/29: unable at this time; 01/13/19: unable at this time, 2/11: min A to step with RLE, unable to step with LLE, 3/10: able to take 2 steps in parallel bars with min A for safety; 4/1: min A for walking 10 feet in parallel bars, 5/24: min A walking 10 feet in parallel bars; 6/21: no change 7/14: no change    Time 12    Period Weeks    Status Partially Met    Target Date 11/12/19      PT LONG TERM GOAL #6   Title Patient will be able to safety navigate up/down ramp with power chair, exhibiting good safety awareness, mod I to safely enter/exit his home.    Baseline 2/11: supervision with intermittent min A;4/1: supervision; 5/24: supervision    Time 12    Period Weeks    Status Partially Met    Target Date 11/12/19      PT LONG TERM GOAL #7   Title Patient will increase functional reach test to >15 inches in sitting to exhibit improved sitting balance and positioning and reduce fall risk;    Baseline 07/30/18: 12 inches; 09/11/18: 10-12 inches; 10/23/18: 12-13 inch; 10/12: 14.5", 10/29: 15.5 inch    Time 4    Period Weeks    Status Achieved    Target Date 11/12/19      PT LONG TERM GOAL #8   Title Patient will increase LLE quad strength to 3+/5 to improve functional strength for standing and mobility;    Baseline 10/28: 2+/5; 01/13/19: 2+/5, 2/11: 3-/5, 04/16/19: 3-/5, 05/08/19: 3-/5, 5/24: 3-/5, 6/21: 3-/5, 7/14: 3-/5    Time 12    Period Weeks  Status Partially Met    Target Date 11/12/19      PT LONG TERM GOAL  #9   TITLE Patient will report no vertigo with provoking motions or positions in order to be able to perform ADLs and mobility tasks without symptoms.    Baseline 06/30/19: reports no dizziness in last week; 6/21: one episode this weekend; 7/14: experiencing one a week;    Time 12    Period Weeks    Status  Partially Met    Target Date 11/12/19                 Plan - 10/20/19 1645    Clinical Impression Statement Continued with current POC, isolated BLE strengthening from [facility], transfers, and tolerance to standing activity. Pt more fatigued this date upon arrival which shows in session. Activation of LLE is somewhat more limited than prior sessions, LLE exercises broken into sets of 5 reps due to higher fatiguability. Performed a trail of transfers from chair at  a lower height this date, equal performance, but still largely limited more by poor tolerance to sustained standing.    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    Examination-Activity Limitations Bed Mobility;Bend;Sit;Toileting;Stand;Stairs;Lift;Transfers;Squat;Locomotion Level;Carry;Dressing;Hygiene/Grooming;Continence    Examination-Participation Restrictions Yard Work;Interpersonal Relationship;Community Activity    Stability/Clinical Decision Making Evolving/Moderate complexity    Clinical Decision Making Moderate    Rehab Potential Fair    PT Frequency 3x / week    PT Duration 12 weeks    PT Treatment/Interventions ADLs/Self Care Home Management;Electrical Stimulation;Moist Heat;Cryotherapy;Gait training;Stair training;Functional mobility training;Therapeutic exercise;Balance training;Neuromuscular re-education;Cognitive remediation;Patient/family education;Orthotic Fit/Training;Wheelchair  mobility training;Manual techniques;Passive range of motion;Energy conservation;Joint Manipulations;Canalith Repostioning;Therapeutic activities;Vestibular    PT Next Visit Plan Work on strength, sitting/standing balance, functional activities such as rolling, bed mobility, transfers; caregiver training    PT Home Exercise Plan no updates this session    Consulted and Agree with Plan of Care Patient;Family member/caregiver    Family Member Consulted DTR           Patient will benefit from skilled therapeutic intervention in order to improve the following deficits and impairments:  Abnormal gait, Decreased activity tolerance, Decreased cognition, Decreased endurance, Decreased knowledge of use of DME, Decreased range of motion, Decreased skin integrity, Decreased strength, Impaired perceived functional ability, Impaired sensation, Impaired UE functional use, Improper body mechanics, Pain, Cardiopulmonary status limiting activity, Decreased balance, Decreased coordination, Decreased mobility, Difficulty walking, Impaired tone, Postural dysfunction, Dizziness  Visit Diagnosis: Muscle weakness (generalized)  Other lack of coordination  Difficulty in walking, not elsewhere classified  Hemiplegia and hemiparesis following cerebral infarction affecting left non-dominant side (HCC)  Repeated falls     Problem List Patient Active Problem List   Diagnosis Date Noted  . Sepsis ([place]) 10/18/2018   4:55 PM, 10/20/19 Etta Grandchild, PT, DPT Physical Therapist - [facility]  [phone] ([address])    Star C 10/20/2019, 4:51 PM  [place] MAIN [facility] SERVICES [address] [place], [place], [address] Phone: [phone]   Fax:  [phone]  Name: Kenneth Summers MRN: [id number]  Date of Birth: 12/15/1945   

## 2019-10-20 NOTE — Therapy (Signed)
[facility] MAIN [facility] SERVICES [address] [place], [place], [address] Phone: [phone]   Fax:  [phone]  Occupational Therapy Treatment  Patient Details  Name: Kenneth Summers MRN: [id number] Date of Birth: 05-12-1945 No data recorded  Encounter Date: 10/20/2019   OT End of Session - 10/20/19 1355    Visit Number [id number]    Number of Visits 157    Date for OT Re-Evaluation 12/15/19    Authorization Type Progress report period starting 07/21/2019    OT Start Time 1345    OT Stop Time 1430    OT Time Calculation (min) 45 min    Activity Tolerance Patient tolerated treatment well    Behavior During Therapy [facility] for tasks assessed/performed           Past Medical History:  Diagnosis Date  . Actinic keratosis   . Alcohol abuse   . APS (antiphospholipid syndrome) (HCC)   . Basal cell carcinoma   . BPH (benign prostatic hyperplasia)   . CAD (coronary artery disease)   . Cellulitis   . Cervical spinal stenosis   . Chronic osteomyelitis (HCC)   . CKD (chronic kidney disease)   . CKD (chronic kidney disease)   . Clostridium difficile diarrhea   . Depression   . Diplopia   . Fatigue   . GERD (gastroesophageal reflux disease)   . Hyperlipemia   . Hypertension   . Hypovitaminosis D   . IBS (irritable bowel syndrome)   . IgA deficiency (HCC)   . Insomnia   . Left lumbosacral radiculopathy   . Moderate obstructive sleep apnea   . Osteomyelitis of foot (HCC)   . PAD (peripheral artery disease) (HCC)   . Pruritus   . RA (rheumatoid arthritis) (HCC)   . Radiculopathy   . Restless legs syndrome   . RLS (restless legs syndrome)   . SLE (systemic lupus erythematosus related syndrome) (HCC)   . 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 Stroke (HCC)   . Toxic maculopathy of both eyes     Past Surgical History:  Procedure Laterality Date  . CARDIAC CATHETERIZATION    . CAROTID ENDARTERECTOMY    .  CERVICAL LAMINECTOMY    . CORONARY ANGIOPLASTY    . FRACTURE SURGERY    . fusion C5-6-7    . HERNIA REPAIR    . KNEE ARTHROSCOPY    . TONSILLECTOMY      There were no vitals filed for this visit.   OT TREATMENT   Therapeutic Exercise:  Pt. performedBUE ther. ex with 2# dowel ex. For UE strengthening secondary to weakness. Bilateral shoulder flexion, chest press, circular patterns, and elbow flexion/extension were performedfor 1 set 10-20reps each.Pt.performed BUE therapeutic ex withredtheraband. Pt. performed bilateral shoulder flexion, abduction, horizontal abduction, diagonal abduction, elbow flexion, and extensionfor1-2 sets 10-20 reps each. 2# dumbbell ex. for elbow flexion and extension,  2# for forearm supination/pronation, wrist flexion/extension, and radial deviation. Pt. requires rest breaks and verbal cues for proper technique.  Pt. reports that they have a new aide that they are very happy with. Pt. Was able to tolerated more UE  exercises today. Pt. continues to present with weakness and impaired motor control, and motor planning. Pt. reports no pain during therapy today.Pt. tolerated BUE exercises well, with no reportsofdiscomfort. Pt. required consistent verbal, visual, and tactile cues to perform UE strengthening exercises, technique, and form. Pt. continues to work on improving  BUE strength, and FMC skills in order to improve LUE functioning during ADLs, IADLs, and tranfers.                        OT Education - 10/20/19 1354    Education Details BUE Therapeutic ex    Person(s) Educated Patient    Methods Explanation;Demonstration    Comprehension Returned demonstration;Tactile cues required;Verbalized understanding;Need further instruction               OT Long Term Goals - 09/22/19 1400      OT LONG TERM GOAL #1   Title Pt. will increase left shoulder flexion AROM by 10 degrees to access cabinet/shelf.    Baseline Pt.  continues to progress with shoulder ROM. Marland KitchenShouler flexion 130, and continues to work on reaching with increasing emphasis on flexing trunk to improve functional reach.  07/21/19- shoulder flexion to 130 with effort    Time 12    Period Weeks    Status Partially Met    Target Date 12/15/19      OT LONG TERM GOAL #2   Title Pt. will donn a shirt with Supervision.    Baseline Pt. continues to require MinA donning a zip down jacket with consistent cues to initiate the correct direction of the jacket.  07/21/19 continues to require min assist at times for shirt/jacket    Time 12    Period Weeks    Status On-going    Target Date 12/15/19      OT LONG TERM GOAL #3   Title Pt. will require ModA to perform LE dressing    Baseline MaxA left, ModA with the right. Pt. has sores on his ankle, and feet. Pt.'s daughter is assisting.    Time 12    Period Weeks    Status Deferred      OT LONG TERM GOAL #4   Title Pt. will improve left grip strength by 10# to be able to open a jar/container.    Baseline Pt. conitnues to present with limited left hand grip strength. Pt. continues to work towards opening containers, and bottles.    Time 12    Period Weeks    Status On-going    Target Date 12/15/19      OT LONG TERM GOAL #5   Title Pt. will improve left hand [facility] skills to be able to assist with buttoning/zipping.    Baseline Pt. is improving with manipulating the zipper on his jacket, however continues to require practice. Pt. continues to work on improving bilateral [facility] skills for buttoning/zipping.  07/21/19 improving with buttons and zippers.    Time 12    Period Weeks    Status On-going    Target Date 12/15/19      OT LONG TERM GOAL #6   Title Pt. will demonstrate visual conmpensatory strategies for 100% of the time during ADLs    Baseline Pt.continues to rerequire intermitten cues cues for left sides awareness.    Time 12    Period Weeks    Status Partially Met    Target Date 12/15/19       OT LONG TERM GOAL #7   Title Pt. will prepare a simple cold snack from w/c with supervision using cognitive compensatory strategies 100% of the time.    Baseline Pt. has difficulty with getting the w/c close enough to access the refrigerator    Time 12    Period Weeks    Status  Deferred      OT LONG TERM GOAL #8   Title Pt. will accurately identify potential safety hazard using good safety awareness, and judgement 100% for ADLs, and IADLs.    Baseline Pt. requires cues    Time 12    Period Weeks    Status On-going    Target Date 12/15/19      OT LONG TERM GOAL  #9   TITLE Pt. will  navigate his w/c around obstacles with Supervision and 100% accuracy    Baseline Pt. continues to require cuing for obstacles on the left, however requires less cues overall. Pt. requires significant cues to engage his left UE during tasks. 07/21/2019 continues to improve with decreased assist at times    Time 12    Period Weeks    Status On-going    Target Date 12/15/19      OT LONG TERM GOAL  #10   TITLE Pt. will independently be able  to reach to place items into cabinetry, and closets.    Baseline Pt. BUE strength, and reaching  is limited.    Time 12    Period Weeks    Status New    Target Date 12/15/19      OT LONG TERM GOAL  #11   TITLE Pt. will increase BUE strength by 22mm grades in preparation for ADL transfers.    Baseline Pt. continues to work towards improving overall strength,    Time 12    Period Weeks    Status On-going    Target Date 12/15/19      OT LONG TERM GOAL  #12   TITLE Pt. will improve LUE functional reaching with the left engaging the trunk 100% of the time during ADL tasks with minimal cues.    Baseline 07/21/2019: Pt. is improving with LUE functional reaching. Pt. conitnues to require cues to engage his trunk for extended reaching.    Time 12    Period Weeks    Status On-going    Target Date 12/15/19                 Plan - 10/20/19 1356    Clinical  Impression Statement Pt. reports that they have a new aide that they are very happy with. Pt. Was able to tolerated more UE  exercises today. Pt. continues to present with weakness and impaired motor control, and motor planning. Pt. reports no pain during therapy today.Pt. tolerated BUE exercises well, with no reportsofdiscomfort. Pt. required consistent verbal, visual, and tactile cues to perform UE strengthening exercises, technique, and form. Pt. continues to work on improving BUE strength, and [facility] skills in order to improve LUE functioning during ADLs, IADLs, and tranfers.   OT Occupational Profile and History Problem Focused Assessment - Including review of records relating to presenting problem    Occupational performance deficits (Please refer to evaluation for details): ADL's    Body Structure / Function / Physical Skills UE functional use;Coordination;FMC;Dexterity;Strength;ROM    Cognitive Skills Attention;Memory;Emotional;Problem Solve;Safety Awareness    Rehab Potential Good    Clinical Decision Making Several treatment options, min-mod task modification necessary    Comorbidities Affecting Occupational Performance: Presence of comorbidities impacting occupational performance    Comorbidities impacting occupational performance description: Phyical, cognitive, visual,  medical comorbidities    Modification or Assistance to Complete Evaluation  No modification of tasks or assist necessary to complete eval    OT Treatment/Interventions Self-care/ADL training;DME and/or AE instruction;Therapeutic exercise;Therapeutic activities;Moist Heat;Cognitive remediation/compensation;Neuromuscular education;Visual/perceptual  remediation/compensation;Coping strategies training;Patient/family education;Passive range of motion;Psychosocial skills training;Energy conservation;Functional Mobility Training    Consulted and Agree with Plan of Care Patient;Family member/caregiver           Patient will  benefit from skilled therapeutic intervention in order to improve the following deficits and impairments:   Body Structure / Function / Physical Skills: UE functional use, Coordination, FMC, Dexterity, Strength, ROM Cognitive Skills: Attention, Memory, Emotional, Problem Solve, Safety Awareness     Visit Diagnosis: Muscle weakness (generalized)  Other Summers of coordination    Problem List Patient Active Problem List   Diagnosis Date Noted  . Sepsis ([place]) 10/18/2018    Harrel Carina, MS, OTR/L 10/20/2019, 2:07 PM  [place] MAIN [facility] SERVICES [address] [place], [place], [address] Phone: [phone]   Fax:  [phone]  Name: Kenneth Summers MRN: [id number] Date of Birth: [birth year]

## 2019-10-22 ENCOUNTER — Ambulatory Visit: Payer: Medicare Other | Admitting: Physical Therapy

## 2019-10-23 ENCOUNTER — Ambulatory Visit: Payer: Medicare Other | Admitting: Physical Therapy

## 2019-10-23 ENCOUNTER — Encounter: Payer: Self-pay | Admitting: Physical Therapy

## 2019-10-23 ENCOUNTER — Other Ambulatory Visit: Payer: Self-pay

## 2019-10-23 ENCOUNTER — Encounter: Payer: Self-pay | Admitting: Occupational Therapy

## 2019-10-23 ENCOUNTER — Ambulatory Visit: Payer: Medicare Other | Admitting: Occupational Therapy

## 2019-10-23 DIAGNOSIS — R278 Other lack of coordination: Secondary | ICD-10-CM

## 2019-10-23 DIAGNOSIS — M6281 Muscle weakness (generalized): Secondary | ICD-10-CM

## 2019-10-23 DIAGNOSIS — R262 Difficulty in walking, not elsewhere classified: Secondary | ICD-10-CM

## 2019-10-23 NOTE — Therapy (Addendum)
[place] MAIN [facility] SERVICES [address] [place], [place], [address] Phone: [phone]   Fax:  [phone]  Occupational Therapy Progress Note  Dates of reporting period  07/21/2019 to  10/23/2019  Patient Details  Name: Kenneth Summers MRN: [id number] Date of Birth: December 31, 1945 No data recorded  Encounter Date: 10/23/2019   OT End of Session - 10/23/19 1313    Visit Number 140    Number of Visits 157    Date for OT Re-Evaluation 12/15/19    Authorization Type Progress report period starting 07/21/2019    OT Start Time 1304    OT Stop Time 1345    OT Time Calculation (min) 41 min    Activity Tolerance Patient tolerated treatment well    Behavior During Therapy [facility] for tasks assessed/performed           Past Medical History:  Diagnosis Date  . Actinic keratosis   . Alcohol abuse   . APS (antiphospholipid syndrome) ([place])   . Basal cell carcinoma   . BPH (benign prostatic hyperplasia)   . CAD (coronary artery disease)   . Cellulitis   . Cervical spinal stenosis   . Chronic osteomyelitis ([place])   . CKD (chronic kidney disease)   . CKD (chronic kidney disease)   . Clostridium difficile diarrhea   . Depression   . Diplopia   . Fatigue   . GERD (gastroesophageal reflux disease)   . Hyperlipemia   . Hypertension   . Hypovitaminosis D   . IBS (irritable bowel syndrome)   . 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 Insomnia   . Left lumbosacral radiculopathy   . Moderate obstructive sleep apnea   . Osteomyelitis of foot ([place])   . PAD (peripheral artery disease) ([place])   . Pruritus   . RA (rheumatoid arthritis) ([place])   . Radiculopathy   . Restless legs syndrome   . RLS (restless legs syndrome)   . SLE (systemic lupus erythematosus related syndrome) ([place])   . 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 Stroke ([place])   . Toxic maculopathy of both eyes     Past Surgical History:  Procedure Laterality Date  .  CARDIAC CATHETERIZATION    . CAROTID ENDARTERECTOMY    . CERVICAL LAMINECTOMY    . CORONARY ANGIOPLASTY    . FRACTURE SURGERY    . fusion C5-6-7    . HERNIA REPAIR    . KNEE ARTHROSCOPY    . TONSILLECTOMY      There were no vitals filed for this visit.   Subjective Assessment - 10/23/19 1311    Subjective  Pt. reports having had pain this morning in his left hand.    Patient is accompanied by: Family member    Pertinent History Pt. is a 74 y.o. 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 Pt. PMHx includes: Multiple Falls, Lupus, DJD, Renal Abscess, CVA. Pt. resides with his wife. Pt.'s wife and daughter assist with ADLs. Pt. has caregivers in  for 2 hours a day, 6 days a week. Pt. received Rehab services in acute care, at SNF for STR, and [place] services. Pt. is retired from [employer] for [employer] and [employer].    Patient Stated Goals To regain use of his left UE, and do more for himself.    Currently in Pain? No/denies  [facility] OT Assessment - 10/23/19 0001      Strength   Overall Strength Comments LUE strength 4-/4 shoulder flexion, 3/5 abduction, elbow flexion, extension 4/5,, wrist extension 4+/5 (P)       Hand Function   Left Hand Grip (lbs) 20 (P)     Left Hand Lateral Pinch 8 lbs (P)     Left 3 point pinch 8 lbs (P)            Pt. has made steady progress with LUE strength, pinch strength, and [place] skills. Pt. improved left hand FMC, speed, and dexterity by nearly 2 min & 50 sec. on the 9 hole peg test. Pt. Is tolerating BUE strengthening exercises, and continues to work on improving overall Left UE strength. Pt. Reports that he is assisting more with daily care. Pt. has intermittent left hand pain upon waking up in the morning which pt. Reports heat helps to alleviate the pain. Pt. Continues to require cues for left sided awareness. Pt. continues to work on improving LUE functioning in order to improve, and maximize  engagement of the left hand during daily tasks, and to improve overall independence with ADLs, and IADL tasks.                  OT Education - 10/23/19 1313    Education Details BUE Therapeutic ex    Person(s) Educated Patient    Methods Explanation;Demonstration    Comprehension Returned demonstration;Tactile cues required;Verbalized understanding;Need further instruction               OT Long Term Goals - 10/23/19 1320      OT LONG TERM GOAL #1   Title Pt. will increase left shoulder flexion AROM by 10 degrees to access cabinet/shelf.    Baseline Pt. continues to progress with shoulder ROM. [place] KitchenShouler flexion 130, and continues to work on reaching with increasing emphasis on flexing trunk to improve functional reach.  07/21/19- shoulder flexion to 130 with effort    Time 12    Period Weeks    Status Partially Met    Target Date 12/15/19      OT LONG TERM GOAL #2   Title Pt. will donn a shirt with Supervision.    Baseline Pt. continues to require MinA donning a zip down jacket with consistent cues to initiate the correct direction of the jacket.  MinA for donning a T-shirt with assist to roll side to side to pull the shirt down over his back. 07/21/19 continues to require min assist at times for shirt/jacket    Time 12    Period Weeks    Status On-going    Target Date 12/15/19      OT LONG TERM GOAL #3   Title Pt. will require ModA to perform LE dressing    Baseline MaxA left, ModA with the right. Pt. has sores on his ankle, and feet. Pt.'s daughter is assisting.    Time 12    Period Weeks    Status Deferred    Target Date 12/15/19      OT LONG TERM GOAL #4   Title Pt. will improve left grip strength by 10# to be able to open a jar/container.    Baseline Pt. continues to present with limited left hand grip strength. Pt. continues to work towards opening containers, and bottles.    Time 12    Period Weeks    Status On-going    Target Date 02/14/19  OT  LONG TERM GOAL #5   Title Pt. will improve left hand [facility] skills to be able to assist with buttoning/zipping.    Baseline Pt. is improving with manipulating the zipper on his jacket, however continues to require practice. Pt. continues to work on improving bilateral [facility] skills for buttoning/zipping.  07/21/19 improving with buttons and zippers.    Time 12    Period Weeks    Status On-going    Target Date 12/15/19      OT LONG TERM GOAL #6   Title Pt. will demonstrate visual conmpensatory strategies for 100% of the time during ADLs    Baseline Pt.continues to require intermitten cues cues for left sides awareness.    Time 12    Period Weeks    Status Partially Met    Target Date 12/15/19      OT LONG TERM GOAL #7   Title Pt. will prepare a simple cold snack from w/c with supervision using cognitive compensatory strategies 100% of the time.    Baseline Pt. has difficulty with getting the w/c close enough to access the refrigerator    Time 12    Period Weeks    Status Deferred      OT LONG TERM GOAL #8   Title Pt. will accurately identify potential safety hazard using good safety awareness, and judgement 100% for ADLs, and IADLs.    Baseline Pt. requires cues    Time 12    Period Weeks    Status On-going    Target Date 12/15/19      OT LONG TERM GOAL  #9   TITLE Pt. will  navigate his w/c around obstacles with Supervision and 100% accuracy    Baseline Pt. continues to require cuing for obstacles on the left, however requires less cues overall. Pt. requires significant cues to engage his left UE during tasks. 07/21/2019 continues to improve with decreased assist at times    Time 12    Period Weeks    Status On-going    Target Date 12/15/19      OT LONG TERM GOAL  #10   TITLE Pt. will independently be able  to reach to place items into cabinetry, and closets.    Baseline Pt. BUE strength is improving. UE functional reaching conitnues to be limited.    Time 12    Period Weeks     Status On-going    Target Date 12/15/19      OT LONG TERM GOAL  #11   TITLE Pt. will increase BUE strength by 20mm grades in preparation for ADL transfers.    Baseline Pt. continues to work towards improving overall strength,    Time 12    Period Weeks    Status On-going    Target Date 12/15/19      OT LONG TERM GOAL  #12   TITLE Pt. will improve LUE functional reaching with the left engaging the trunk 100% of the time during ADL tasks with minimal cues.    Baseline Pt. conitnues to improve with LUE functional reaching. Pt. conitnues to require cues to engage his trunk for extended reaching.    Time 12    Period Weeks    Status On-going    Target Date 12/15/19                 Plan - 10/23/19 1315    Clinical Impression Statement Pt. has made steady progress with LUE strength, pinch strength, and [place] skills.  Pt. improved left hand FMC, speed, and dexterity by nearly 2 min & 50 sec. on the 9 hole peg test. Pt. Is tolerating BUE strengthening exercises, and continues to work on improving overall Left UE strength. Pt. Reports that he is assisting more with daily care. Pt. has intermittent left hand pain upon waking up in the morning which pt. Reports heat helps to alleviate the pain. Pt. Continues to require cues for left sided awareness. Pt. continues to work on improving LUE functioning in order to improve, and maximize engagement of the left hand during daily tasks, and to improve overall independence with ADLs, and IADL tasks.    OT Occupational Profile and History Problem Focused Assessment - Including review of records relating to presenting problem    Occupational performance deficits (Please refer to evaluation for details): ADL's    Body Structure / Function / Physical Skills UE functional use;Coordination;FMC;Dexterity;Strength;ROM    Cognitive Skills Attention;Memory;Emotional;Problem Solve;Safety Awareness    Rehab Potential Good    Clinical Decision Making Several treatment  options, min-mod task modification necessary    Comorbidities Affecting Occupational Performance: Presence of comorbidities impacting occupational performance    Comorbidities impacting occupational performance description: Phyical, cognitive, visual,  medical comorbidities    Modification or Assistance to Complete Evaluation  No modification of tasks or assist necessary to complete eval    OT Frequency 2x / week    OT Duration 12 weeks    OT Treatment/Interventions Self-care/ADL training;DME and/or AE instruction;Therapeutic exercise;Therapeutic activities;Moist Heat;Cognitive remediation/compensation;Neuromuscular education;Visual/perceptual remediation/compensation;Coping strategies training;Patient/family education;Passive range of motion;Psychosocial skills training;Energy conservation;Functional Mobility Training    Consulted and Agree with Plan of Care Patient;Family member/caregiver    Family Member Consulted daughter [name]           Patient will benefit from skilled therapeutic intervention in order to improve the following deficits and impairments:   Body Structure / Function / Physical Skills: UE functional use, Coordination, FMC, Dexterity, Strength, ROM Cognitive Skills: Attention, Memory, Emotional, Problem Solve, Safety Awareness     Visit Diagnosis: Muscle weakness (generalized)    Problem List Patient Active Problem List   Diagnosis Date Noted  . Sepsis ([place]) 10/18/2018    Harrel Carina, MS, OTR/L 10/23/2019, 3:54 PM  [place] MAIN [facility] SERVICES [address] [place], [place], [address] Phone: [phone]   Fax:  [phone]  Name: JMARI PELC MRN: [id number] Date of Birth: 1946/01/01

## 2019-10-23 NOTE — Therapy (Signed)
[place] MAIN [facility] SERVICES [address] [place], [place], [address] Phone: [phone]   Fax:  [phone]  Physical Therapy Treatment  Patient Details  Name: Kenneth Summers MRN: [id number] Date of Birth: 29-Sep-1945 No data recorded  Encounter Date: 10/23/2019   PT End of Session - 10/23/19 1406    Visit Number [id number]    Number of Visits 216    Date for PT Re-Evaluation 11/12/19    PT Start Time 1346    PT Stop Time 1430    PT Time Calculation (min) 44 min    Equipment Utilized During Treatment Gait belt    Activity Tolerance Patient tolerated treatment well;No increased pain    Behavior During Therapy WFL for tasks assessed/performed           Past Medical History:  Diagnosis Date  . Actinic keratosis   . Alcohol abuse   . APS (antiphospholipid syndrome) ([place])   . Basal cell carcinoma   . BPH (benign prostatic hyperplasia)   . CAD (coronary artery disease)   . Cellulitis   . Cervical spinal stenosis   . Chronic osteomyelitis ([place])   . CKD (chronic kidney disease)   . CKD (chronic kidney disease)   . Clostridium difficile diarrhea   . Depression   . Diplopia   . Fatigue   . GERD (gastroesophageal reflux disease)   . Hyperlipemia   . Hypertension   . Hypovitaminosis D   . IBS (irritable bowel syndrome)   . 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 Insomnia   . Left lumbosacral radiculopathy   . Moderate obstructive sleep apnea   . Osteomyelitis of foot ([place])   . PAD (peripheral artery disease) ([place])   . Pruritus   . RA (rheumatoid arthritis) ([place])   . Radiculopathy   . Restless legs syndrome   . RLS (restless legs syndrome)   . SLE (systemic lupus erythematosus related syndrome) ([place])   . 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 Stroke ([place])   . Toxic maculopathy of both eyes     Past Surgical History:  Procedure Laterality Date  . CARDIAC CATHETERIZATION    . CAROTID ENDARTERECTOMY    .  CERVICAL LAMINECTOMY    . CORONARY ANGIOPLASTY    . FRACTURE SURGERY    . fusion C5-6-7    . HERNIA REPAIR    . KNEE ARTHROSCOPY    . TONSILLECTOMY      There were no vitals filed for this visit.   Subjective Assessment - 10/23/19 1405    Subjective Patient reports some fatigue and stiffness especially in his left leg; Patient denies any pain;    Pertinent History Patient is a 74 y.o. male who presents to outpatient physical therapy with a referral for medical diagnosis of CVA. This patient's chief complaints consist of left hemiplegia and overall deconditioning leading to the following functional deficits: dependent for ADLs, IADLs, unable to transfer to car, dependent transfers at home with hoyer lift, unable to walk or stand without significant assistance, difficulty with W/C navigation..    Diagnostic tests Brain MRI 11/01/2017: IMPRESSION: 1. Acute right ACA territory infarct as demonstrated on prior CT imaging. No intracranial hemorrhage or significant mass effect. 2. 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    Patient Stated Goals wants to be able to walk again    Currently in Pain? No/denies  TREATMENT: Prior to transfersand in between transfers: Patient sittingin wheelchair: -LLE only LAQx10 -forward weight shift tricep press 2x10 reps;  -arms in lap, resting UE, erect posture,deep breaths x5 reps; Required min VCs and visual cues for better AROM to increase strengthening;  Ptrequires verbal and visual cues to improve ROM;He was able to exhibit improved LLE AROM with cues; Patient also requires cues for erect posture for better trunk control;Patient exhibits improved neutral position this session with less lateral trunk lean;  Following exercise: Transferred sit<>Stand in parallel bars (seat adjusted to27inch from floor height) X8 repswith CGAto close supervision;herequiredmodVCs for forward  weight shift for better trunk control and transfer ability; Patient able to exhibit better transfer ability with increased forward weight shiftwith less posterior loss of balance.   Patient required min Ato CGA for standing posture. He was able to exhibit less posterior lean but does require cues for increased hip/knee extension for better weight bearing in BLE in standing;  While standing: -mini squatx10 reps with min A for safety; -BLE heel raises x10 reps;  -RLE hip flexion march x10 reps, with min A and therapist blocking LLE knee;   -Attempted unsupported standing x10 sec but patient had significant difficulty keeping erect posture/forward weight shift with heavy posterior lean;   Patient requiredmodA to scoot back in chair at end of session due to increased fatigue    Response to treatment:Patient tolerated session well.He does fatigue quickly. Patient was able to exhibit better LLE AROM this session compared to previous sessions. He does exhibit increased posterior lean in standing due to decreased hip extension. Reinforced HEP                     PT Education - 10/23/19 1406    Education Details transfers/positioning, LE strengthening, HEP    Person(s) Educated Patient    Methods Explanation;Verbal cues    Comprehension Verbalized understanding;Returned demonstration;Verbal cues required;Need further instruction            PT Short Term Goals - 08/18/19 1305      PT SHORT TERM GOAL #1   Title Be independent with initial home exercise program for self-management of symptoms.    Baseline HEP to be given at second session; advice to practice unsupported sitting at home (06/19/2018); 07/08/18: Performing at home, 6/29: needs assistance but is doing exercise program regularly; 08/22/18: adherent;    Time 2    Period Weeks    Status Achieved    Target Date 06/26/18             PT Long Term Goals - 10/02/19 1524      PT LONG TERM GOAL #1    Title Patient will complete all bed mobility with min A to improve functional independence for getting in and out of bed and adjusting in bed.     Baseline max A - total A reported by family (06/19/2018); 07/08/18: still requires maxA, dtr reports improvement in rolling since starting therapy; 6/23: min A to supervision in clinic; not doing at home right; 8/5: Pt's daughter states that his rolling has improved, but still has difficulty with all other bed mobility; 10/23/18: pts daughter states that she is doing about 35% of the work if he is in proper position, he does uses the bed rails to help roll;  using Harrel Lemon for coming to sit EOB; 10/12: pts daughter states that she is doing about 30% of the work if he is in proper position, he does uses  the bed rails to help roll, but it is improving;  using Hoyer for coming to sit EOB, 10/29: min A for sit to sidelying, rolling supervision, able to transition sidelying to sit with HHA;    Time 12    Period Weeks    Status Achieved    Target Date 12/04/18      PT LONG TERM GOAL #2   Title Patient will complete sit <> stand transfer chair to chair with Min A and LRAD to improve functional independence for household and community mobility.     Baseline supervision for STS, still requires elevated surface height to 27"    Time 12    Period Weeks    Status Achieved    Target Date 05/23/19      PT LONG TERM GOAL #3   Title Patient will navigate power w/c with min A x 100 feet to improve mobility for household and short community distances.    Baseline required total A (06/12/2018); able to roll 20 feet with min A (06/20/2018); 07/08/18: Pt can go approximately 10' without assist per family;  10/23/2018: pt's daughter states that his power WC mobility has become 100% better with the new hand control, slowed speed, and with practice; 10/12: pt's daughter reports that he can perform household WC mobility and limited community ambulation with minA    Time 12    Period Weeks     Status Achieved    Target Date 05/23/19      PT LONG TERM GOAL #4   Title Patient will complete car transfers W/C to family SUV with min A using LRAD to improve his abilty to participate in community activities.     Baseline unable to complete car transfers at all (06/12/2018); unable to complete car transfers at all (06/19/2018).; 07/08/18: Unable to perform at this time; 09/11/18: unable to perform at this time; 10/23/2018: unable to complete car transfers at all; 10/12:  unable to complete car transfers at all, 10/29: unable; 01/13/19: unable; 2/11: min-mod A +2, 3/10: min A+1    Time 12    Period Weeks    Status Achieved    Target Date 05/23/19      PT LONG TERM GOAL #5   Title Patient will ambulate at least 10 feet with min A using LRAD to improve mobility for household and community distances.    Baseline unable to take steps (06/12/2018); able to shift R leg forward and back with max A and RW at edge of plinth (06/19/2018); 07/08/18: unable to ambulate at this time. 09/11/18: unable to perform at this time; 10/23/2018: unable to perform at this time; 10/12: unable to perform at this time, 10/29: unable at this time; 01/13/19: unable at this time, 2/11: min A to step with RLE, unable to step with LLE, 3/10: able to take 2 steps in parallel bars with min A for safety; 4/1: min A for walking 10 feet in parallel bars, 5/24: min A walking 10 feet in parallel bars; 6/21: no change 7/14: no change    Time 12    Period Weeks    Status Partially Met    Target Date 11/12/19      PT LONG TERM GOAL #6   Title Patient will be able to safety navigate up/down ramp with power chair, exhibiting good safety awareness, mod I to safely enter/exit his home.    Baseline 2/11: supervision with intermittent min A;4/1: supervision; 5/24: supervision    Time 12  Period Weeks    Status Partially Met    Target Date 11/12/19      PT LONG TERM GOAL #7   Title Patient will increase functional reach test to >15 inches in  sitting to exhibit improved sitting balance and positioning and reduce fall risk;    Baseline 07/30/18: 12 inches; 09/11/18: 10-12 inches; 10/23/18: 12-13 inch; 10/12: 14.5", 10/29: 15.5 inch    Time 4    Period Weeks    Status Achieved    Target Date 11/12/19      PT LONG TERM GOAL #8   Title Patient will increase LLE quad strength to 3+/5 to improve functional strength for standing and mobility;    Baseline 10/28: 2+/5; 01/13/19: 2+/5, 2/11: 3-/5, 04/16/19: 3-/5, 05/08/19: 3-/5, 5/24: 3-/5, 6/21: 3-/5, 7/14: 3-/5    Time 12    Period Weeks    Status Partially Met    Target Date 11/12/19      PT LONG TERM GOAL  #9   TITLE Patient will report no vertigo with provoking motions or positions in order to be able to perform ADLs and mobility tasks without symptoms.    Baseline 06/30/19: reports no dizziness in last week; 6/21: one episode this weekend; 7/14: experiencing one a week;    Time 12    Period Weeks    Status Partially Met    Target Date 11/12/19                 Plan - 10/23/19 1452    Clinical Impression Statement Patient motivated and participated well within session. He does report increased fatigue and slight stiffness in LLE this session. He was able to exhibit better AROM with LE strengthening exercise. Patient does fatigue quickly with standing often exhibiting increased posterior lean. He required increased cues for forward weight shift and better weight bearing in BLE. Patient did require cues for erect posture and increased hip extension. He was able to stand longer at start of session but by end of session was quickly fatigued unable to maintain standing for >10 sec. patient would benefit from additional skilled PT Intervention to improve strength, balance and mobility;    Personal Factors and Comorbidities Age;Comorbidity 3+    Comorbidities Relevant past medical history and comorbidities include long term steroid use for lupus, CKD, chronic osteomyelitis, cervical spine  stenosis, BPH, APS, alcohol abuse, peripheral artery disease, depression, GERD, obstructive sleep apnea, HTN, IBS, IgA deficiency, lumbar radiculopathy, RA, Stroke, toxic maculopathy in both eyes, systemic lupus erythematosus related syndrome, cardiac catheterization, carotid endarterectomy, cervical laminectomy, coronary angioplasty, Fusion C5-C7, knee arthroplasty, lumbar surgery.    Examination-Activity Limitations Bed Mobility;Bend;Sit;Toileting;Stand;Stairs;Lift;Transfers;Squat;Locomotion Level;Carry;Dressing;Hygiene/Grooming;Continence    Examination-Participation Restrictions Yard Work;Interpersonal Relationship;Community Activity    Stability/Clinical Decision Making Evolving/Moderate complexity    Rehab Potential Fair    PT Frequency 3x / week    PT Duration 12 weeks    PT Treatment/Interventions ADLs/Self Care Home Management;Electrical Stimulation;Moist Heat;Cryotherapy;Gait training;Stair training;Functional mobility training;Therapeutic exercise;Balance training;Neuromuscular re-education;Cognitive remediation;Patient/family education;Orthotic Fit/Training;Wheelchair mobility training;Manual techniques;Passive range of motion;Energy conservation;Joint Manipulations;Canalith Repostioning;Therapeutic activities;Vestibular    PT Next Visit Plan Work on strength, sitting/standing balance, functional activities such as rolling, bed mobility, transfers; caregiver training    PT Home Exercise Plan no updates this session    Consulted and Agree with Plan of Care Patient;Family member/caregiver    Family Member Consulted DTR           Patient will benefit from skilled therapeutic intervention in order to improve the following  deficits and impairments:  Abnormal gait, Decreased activity tolerance, Decreased cognition, Decreased endurance, Decreased knowledge of use of DME, Decreased range of motion, Decreased skin integrity, Decreased strength, Impaired perceived functional ability, Impaired  sensation, Impaired UE functional use, Improper body mechanics, Pain, Cardiopulmonary status limiting activity, Decreased balance, Decreased coordination, Decreased mobility, Difficulty walking, Impaired tone, Postural dysfunction, Dizziness  Visit Diagnosis: Muscle weakness (generalized)  Other lack of coordination  Difficulty in walking, not elsewhere classified     Problem List Patient Active Problem List   Diagnosis Date Noted  . Sepsis ([place]) 10/18/2018    Aalijah Mims PT, DPT 10/23/2019, 2:54 PM  [place] MAIN [facility] SERVICES [address] [place], [place], [address] Phone: [phone]   Fax:  [phone]  Name: Kenneth Summers MRN: [id number] Date of Birth: March 05, 1945

## 2019-10-27 ENCOUNTER — Other Ambulatory Visit: Payer: Self-pay

## 2019-10-27 ENCOUNTER — Ambulatory Visit: Payer: Medicare Other | Admitting: Physical Therapy

## 2019-10-27 ENCOUNTER — Encounter: Payer: Self-pay | Admitting: Occupational Therapy

## 2019-10-27 ENCOUNTER — Encounter: Payer: Self-pay | Admitting: Physical Therapy

## 2019-10-27 ENCOUNTER — Ambulatory Visit: Payer: Medicare Other | Admitting: Occupational Therapy

## 2019-10-27 DIAGNOSIS — M6281 Muscle weakness (generalized): Secondary | ICD-10-CM

## 2019-10-27 DIAGNOSIS — R278 Other lack of coordination: Secondary | ICD-10-CM

## 2019-10-27 DIAGNOSIS — R262 Difficulty in walking, not elsewhere classified: Secondary | ICD-10-CM

## 2019-10-27 NOTE — Therapy (Signed)
[place] MAIN [facility] SERVICES [address] [place], [place], [address] Phone: [phone]   Fax:  [phone]  Physical Therapy Treatment  Patient Details  Name: Kenneth Summers MRN: [id number] Date of Birth: [birth year] No data recorded  Encounter Date: 10/27/2019   PT End of Session - 10/27/19 1346    Visit Number [id number]    Number of Visits 216    Date for PT Re-Evaluation 11/12/19    PT Start Time 9381    PT Stop Time 1345    PT Time Calculation (min) 40 min    Equipment Utilized During Treatment Gait belt    Activity Tolerance Patient tolerated treatment well;No increased pain    Behavior During Therapy WFL for tasks assessed/performed           Past Medical History:  Diagnosis Date  . Actinic keratosis   . Alcohol abuse   . APS (antiphospholipid syndrome) ([place])   . Basal cell carcinoma   . BPH (benign prostatic hyperplasia)   . CAD (coronary artery disease)   . Cellulitis   . Cervical spinal stenosis   . Chronic osteomyelitis ([place])   . CKD (chronic kidney disease)   . CKD (chronic kidney disease)   . Clostridium difficile diarrhea   . Depression   . Diplopia   . Fatigue   . GERD (gastroesophageal reflux disease)   . Hyperlipemia   . Hypertension   . Hypovitaminosis D   . IBS (irritable bowel syndrome)   . 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 Insomnia   . Left lumbosacral radiculopathy   . Moderate obstructive sleep apnea   . Osteomyelitis of foot ([place])   . PAD (peripheral artery disease) ([place])   . Pruritus   . RA (rheumatoid arthritis) ([place])   . Radiculopathy   . Restless legs syndrome   . RLS (restless legs syndrome)   . SLE (systemic lupus erythematosus related syndrome) ([place])   . 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 Stroke ([place])   . Toxic maculopathy of both eyes     Past Surgical History:  Procedure Laterality Date  . CARDIAC CATHETERIZATION    . CAROTID ENDARTERECTOMY    .  CERVICAL LAMINECTOMY    . CORONARY ANGIOPLASTY    . FRACTURE SURGERY    . fusion C5-6-7    . HERNIA REPAIR    . KNEE ARTHROSCOPY    . TONSILLECTOMY      There were no vitals filed for this visit.   Subjective Assessment - 10/27/19 1320    Subjective Patient reports doing well; Denies any pain; Reports having a good weekend; Was able to stand some this weekend;    Pertinent History Patient is a 74 y.o. male who presents to outpatient physical therapy with a referral for medical diagnosis of CVA. This patient's chief complaints consist of left hemiplegia and overall deconditioning leading to the following functional deficits: dependent for ADLs, IADLs, unable to transfer to car, dependent transfers at home with hoyer lift, unable to walk or stand without significant assistance, difficulty with W/C navigation..    Diagnostic tests Brain MRI 11/01/2017: IMPRESSION: 1. Acute right ACA territory infarct as demonstrated on prior CT imaging. No intracranial hemorrhage or significant mass effect. 2. 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    Patient Stated Goals wants to be able to walk again    Currently in Pain? No/denies  TREATMENT: Prior to transfersand in between transfers: Patient sittingin wheelchair: -LLE only LAQx10 with visual/tactile cues for increased AROM -Hip flexion march x15 reps each LE with cues for trunk control to midline;  -arms in lap, resting UE, erect posture,deep breaths x5 reps; Required min VCs and visual cues for better AROM to increase strengthening;  Ptrequires verbal and visual cues to improve ROM;He was able to exhibit improved LLE AROM with cues; Patient also requires cues for erect posture for better trunk control;Patient exhibits improved neutral position this session with less lateral trunk lean;  Following exercise: Transferred sit<>Stand in parallel bars (seat adjusted to27inch from  floor height) X 8 repswith CGAto close supervision;herequiredmodVCs for forward weight shift for better trunk control and transfer ability; Patient able to exhibit better transfer ability with increased forward weight shiftwith less posterior loss of balance.   Patient required min Ato CGA for standing posture. He was able to exhibit less posterior lean but does require cues for increased hip/knee extension for better weight bearing in BLE in standing;  While standing: -mini squatx10reps with min A for safety; -BLE heel raises x10 reps;  -RLE hip flexion marchx10reps, with min A and therapist blocking LLE knee;  -Attempted LLE hip flexion toe taps but patient unable to shift weight for foot clearance due to fatigue;  -Attempted unsupported standing x10 sec with CGA with better knee extension and gluteal activation;  -sit<>stand from chair with min A for posture and weight shift x5 reps;   Patient requiredmodA to scoot back in chair at end of session due to increased fatigue    Response to treatment:Patient tolerated session well.He does fatigue quickly. Patient was able to exhibit better LLE AROM this session compared to previous sessions. He does exhibit increased posterior lean in standing due to decreased hip extension. Reinforced HEP                        PT Education - 10/27/19 1345    Education Details transfers/positioning, LE strengthening, HEP    Person(s) Educated Patient    Methods Explanation;Verbal cues    Comprehension Verbalized understanding;Returned demonstration;Verbal cues required;Need further instruction            PT Short Term Goals - 08/18/19 1305      PT SHORT TERM GOAL #1   Title Be independent with initial home exercise program for self-management of symptoms.    Baseline HEP to be given at second session; advice to practice unsupported sitting at home (06/19/2018); 07/08/18: Performing at home, 6/29: needs  assistance but is doing exercise program regularly; 08/22/18: adherent;    Time 2    Period Weeks    Status Achieved    Target Date 06/26/18             PT Long Term Goals - 10/02/19 1524      PT LONG TERM GOAL #1   Title Patient will complete all bed mobility with min A to improve functional independence for getting in and out of bed and adjusting in bed.     Baseline max A - total A reported by family (06/19/2018); 07/08/18: still requires maxA, dtr reports improvement in rolling since starting therapy; 6/23: min A to supervision in clinic; not doing at home right; 8/5: Pt's daughter states that his rolling has improved, but still has difficulty with all other bed mobility; 10/23/18: pts daughter states that she is doing about 35% of the work if he is in  proper position, he does uses the bed rails to help roll;  using Harrel Lemon for coming to sit EOB; 10/12: pts daughter states that she is doing about 30% of the work if he is in proper position, he does uses the bed rails to help roll, but it is improving;  using Hoyer for coming to sit EOB, 10/29: min A for sit to sidelying, rolling supervision, able to transition sidelying to sit with HHA;    Time 12    Period Weeks    Status Achieved    Target Date 12/04/18      PT LONG TERM GOAL #2   Title Patient will complete sit <> stand transfer chair to chair with Min A and LRAD to improve functional independence for household and community mobility.     Baseline supervision for STS, still requires elevated surface height to 27"    Time 12    Period Weeks    Status Achieved    Target Date 05/23/19      PT LONG TERM GOAL #3   Title Patient will navigate power w/c with min A x 100 feet to improve mobility for household and short community distances.    Baseline required total A (06/12/2018); able to roll 20 feet with min A (06/20/2018); 07/08/18: Pt can go approximately 10' without assist per family;  10/23/2018: pt's daughter states that his power WC  mobility has become 100% better with the new hand control, slowed speed, and with practice; 10/12: pt's daughter reports that he can perform household WC mobility and limited community ambulation with minA    Time 12    Period Weeks    Status Achieved    Target Date 05/23/19      PT LONG TERM GOAL #4   Title Patient will complete car transfers W/C to family SUV with min A using LRAD to improve his abilty to participate in community activities.     Baseline unable to complete car transfers at all (06/12/2018); unable to complete car transfers at all (06/19/2018).; 07/08/18: Unable to perform at this time; 09/11/18: unable to perform at this time; 10/23/2018: unable to complete car transfers at all; 10/12:  unable to complete car transfers at all, 10/29: unable; 01/13/19: unable; 2/11: min-mod A +2, 3/10: min A+1    Time 12    Period Weeks    Status Achieved    Target Date 05/23/19      PT LONG TERM GOAL #5   Title Patient will ambulate at least 10 feet with min A using LRAD to improve mobility for household and community distances.    Baseline unable to take steps (06/12/2018); able to shift R leg forward and back with max A and RW at edge of plinth (06/19/2018); 07/08/18: unable to ambulate at this time. 09/11/18: unable to perform at this time; 10/23/2018: unable to perform at this time; 10/12: unable to perform at this time, 10/29: unable at this time; 01/13/19: unable at this time, 2/11: min A to step with RLE, unable to step with LLE, 3/10: able to take 2 steps in parallel bars with min A for safety; 4/1: min A for walking 10 feet in parallel bars, 5/24: min A walking 10 feet in parallel bars; 6/21: no change 7/14: no change    Time 12    Period Weeks    Status Partially Met    Target Date 11/12/19      PT LONG TERM GOAL #6   Title Patient will be  able to safety navigate up/down ramp with power chair, exhibiting good safety awareness, mod I to safely enter/exit his home.    Baseline 2/11: supervision with  intermittent min A;4/1: supervision; 5/24: supervision    Time 12    Period Weeks    Status Partially Met    Target Date 11/12/19      PT LONG TERM GOAL #7   Title Patient will increase functional reach test to >15 inches in sitting to exhibit improved sitting balance and positioning and reduce fall risk;    Baseline 07/30/18: 12 inches; 09/11/18: 10-12 inches; 10/23/18: 12-13 inch; 10/12: 14.5", 10/29: 15.5 inch    Time 4    Period Weeks    Status Achieved    Target Date 11/12/19      PT LONG TERM GOAL #8   Title Patient will increase LLE quad strength to 3+/5 to improve functional strength for standing and mobility;    Baseline 10/28: 2+/5; 01/13/19: 2+/5, 2/11: 3-/5, 04/16/19: 3-/5, 05/08/19: 3-/5, 5/24: 3-/5, 6/21: 3-/5, 7/14: 3-/5    Time 12    Period Weeks    Status Partially Met    Target Date 11/12/19      PT LONG TERM GOAL  #9   TITLE Patient will report no vertigo with provoking motions or positions in order to be able to perform ADLs and mobility tasks without symptoms.    Baseline 06/30/19: reports no dizziness in last week; 6/21: one episode this weekend; 7/14: experiencing one a week;    Time 12    Period Weeks    Status Partially Met    Target Date 11/12/19                 Plan - 10/27/19 1346    Clinical Impression Statement Patient motivated and participated well within session. he continues to require cues for forward weight shift with transfers. However this session patient able to scoot forward better and position LLE into proper position for transfers with less cues. He was also able to exhibit a gluteal contraction for hip extension for better weight shift into standing. Patient does fatigue quickly requiring short seated rest breaks. He continues to improve in LLE AROM with visual/verbal cues . He would benefit from additional skilled PT Intervention to improve strength, balance and mobility;    Personal Factors and Comorbidities Age;Comorbidity 3+     Comorbidities Relevant past medical history and comorbidities include long term steroid use for lupus, CKD, chronic osteomyelitis, cervical spine stenosis, BPH, APS, alcohol abuse, peripheral artery disease, depression, GERD, obstructive sleep apnea, HTN, IBS, IgA deficiency, lumbar radiculopathy, RA, Stroke, toxic maculopathy in both eyes, systemic lupus erythematosus related syndrome, cardiac catheterization, carotid endarterectomy, cervical laminectomy, coronary angioplasty, Fusion C5-C7, knee arthroplasty, lumbar surgery.    Examination-Activity Limitations Bed Mobility;Bend;Sit;Toileting;Stand;Stairs;Lift;Transfers;Squat;Locomotion Level;Carry;Dressing;Hygiene/Grooming;Continence    Examination-Participation Restrictions Yard Work;Interpersonal Relationship;Community Activity    Stability/Clinical Decision Making Evolving/Moderate complexity    Rehab Potential Fair    PT Frequency 3x / week    PT Duration 12 weeks    PT Treatment/Interventions ADLs/Self Care Home Management;Electrical Stimulation;Moist Heat;Cryotherapy;Gait training;Stair training;Functional mobility training;Therapeutic exercise;Balance training;Neuromuscular re-education;Cognitive remediation;Patient/family education;Orthotic Fit/Training;Wheelchair mobility training;Manual techniques;Passive range of motion;Energy conservation;Joint Manipulations;Canalith Repostioning;Therapeutic activities;Vestibular    PT Next Visit Plan Work on strength, sitting/standing balance, functional activities such as rolling, bed mobility, transfers; caregiver training    PT Home Exercise Plan no updates this session    Consulted and Agree with Plan of Care Patient;Family member/caregiver    Family  Member Consulted DTR           Patient will benefit from skilled therapeutic intervention in order to improve the following deficits and impairments:  Abnormal gait, Decreased activity tolerance, Decreased cognition, Decreased endurance, Decreased  knowledge of use of DME, Decreased range of motion, Decreased skin integrity, Decreased strength, Impaired perceived functional ability, Impaired sensation, Impaired UE functional use, Improper body mechanics, Pain, Cardiopulmonary status limiting activity, Decreased balance, Decreased coordination, Decreased mobility, Difficulty walking, Impaired tone, Postural dysfunction, Dizziness  Visit Diagnosis: Muscle weakness (generalized)  Other lack of coordination  Difficulty in walking, not elsewhere classified     Problem List Patient Active Problem List   Diagnosis Date Noted  . Sepsis ([place]) 10/18/2018    Trotter,Margaret PT, DPT 10/27/2019, 1:48 PM  [place] MAIN [facility] SERVICES [address] [place], [place], [address] Phone: [phone]   Fax:  [phone]  Name: Kenneth Summers MRN: [id number] Date of Birth: Dec 03, 1945

## 2019-10-27 NOTE — Therapy (Signed)
[place] MAIN [facility] SERVICES [address] [place], [place], [address] Phone: [phone]   Fax:  [phone]  Occupational Therapy Treatment  Patient Details  Name: Kenneth Summers MRN: [id number] Date of Birth: August 03, 1945 No data recorded  Encounter Date: 10/27/2019   OT End of Session - 10/27/19 1605    Visit Number 140    Number of Visits 157    Date for OT Re-Evaluation 12/15/19    Authorization Type Progress report period starting 07/21/2019    OT Start Time 1348    OT Stop Time 1430    OT Time Calculation (min) 42 min    Activity Tolerance Patient tolerated treatment well    Behavior During Therapy [facility] for tasks assessed/performed           Past Medical History:  Diagnosis Date  . Actinic keratosis   . Alcohol abuse   . APS (antiphospholipid syndrome) ([place])   . Basal cell carcinoma   . BPH (benign prostatic hyperplasia)   . CAD (coronary artery disease)   . Cellulitis   . Cervical spinal stenosis   . Chronic osteomyelitis ([place])   . CKD (chronic kidney disease)   . CKD (chronic kidney disease)   . Clostridium difficile diarrhea   . Depression   . Diplopia   . Fatigue   . GERD (gastroesophageal reflux disease)   . Hyperlipemia   . Hypertension   . Hypovitaminosis D   . IBS (irritable bowel syndrome)   . 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 Insomnia   . Left lumbosacral radiculopathy   . Moderate obstructive sleep apnea   . Osteomyelitis of foot ([place])   . PAD (peripheral artery disease) ([place])   . Pruritus   . RA (rheumatoid arthritis) ([place])   . Radiculopathy   . Restless legs syndrome   . RLS (restless legs syndrome)   . SLE (systemic lupus erythematosus related syndrome) ([place])   . 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 Stroke ([place])   . Toxic maculopathy of both eyes     Past Surgical History:  Procedure Laterality Date  . CARDIAC CATHETERIZATION    . CAROTID ENDARTERECTOMY    .  CERVICAL LAMINECTOMY    . CORONARY ANGIOPLASTY    . FRACTURE SURGERY    . fusion C5-6-7    . HERNIA REPAIR    . KNEE ARTHROSCOPY    . TONSILLECTOMY      There were no vitals filed for this visit.   Subjective Assessment - 10/27/19 1602    Subjective  Pt. reports that his left hand was sore over the weekend, and needed to use a heat pad    Patient is accompanied by: Family member    Pertinent History Pt. is a 74 y.o. 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 Pt. PMHx includes: Multiple Falls, Lupus, DJD, Renal Abscess, CVA. Pt. resides with his wife. Pt.'s wife and daughter assist with ADLs. Pt. has caregivers in  for 2 hours a day, 6 days a week. Pt. received Rehab services in acute care, at SNF for STR, and [facility]. Pt. is retired from [employer] for [employer] and [employer].    Patient Stated Goals To regain use of his left UE, and do more for himself.    Currently in Pain? No/denies           OT TREATMENT  Therapeutic Exercise:  Pt. performedBUE ther. ex with 2# dowel ex. For UE strengthening secondary to weakness. Bilateral shoulder flexion, chest press, circular patterns, and elbow flexion/extension were performedfor 1 set 10-20reps each.Pt.performed BUE therapeutic ex withredtheraband. Pt. performed bilateral shoulder flexion, abduction, horizontal abduction, diagonal abduction, elbow flexion, and extensionfor1-2 sets 10-20 reps each. 2# dumbbell ex. For bilateral elbow flexion and extension, forearm supination/pronation, right wrist flexion/extension, and radial deviation. Pt. requires rest breaks and verbal cues for proper technique.  Pt. reports having had left hand pain over the weekend requiring a heat pad for relief. Pt. tolerated the session well today with the addition of the UBE, which the pt. Reported enjoying.  Pt.continues to present with weakness and impaired motor control, and motor planning.Pt. tolerated  BUE exercises well, with no reportsofdiscomfort. Pt. required consistent verbal, visual, and tactile cues to perform UE strengthening exercises, technique, and form as well as visual demonstration prior to the exercises.Pt. continues to work on improving BUE strength, and [facility] skills in order to improve LUE functioning during ADLs, IADLs, and tranfers.                       OT Education - 10/27/19 1605    Education Details BUE Therapeutic ex    Person(s) Educated Patient    Methods Explanation;Demonstration    Comprehension Returned demonstration;Tactile cues required;Verbalized understanding;Need further instruction               OT Long Term Goals - 10/23/19 1320      OT LONG TERM GOAL #1   Title Pt. will increase left shoulder flexion AROM by 10 degrees to access cabinet/shelf.    Baseline Pt. continues to progress with shoulder ROM. Marland KitchenShouler flexion 130, and continues to work on reaching with increasing emphasis on flexing trunk to improve functional reach.  07/21/19- shoulder flexion to 130 with effort    Time 12    Period Weeks    Status Partially Met    Target Date 12/15/19      OT LONG TERM GOAL #2   Title Pt. will donn a shirt with Supervision.    Baseline Pt. continues to require MinA donning a zip down jacket with consistent cues to initiate the correct direction of the jacket.  MinA for donning a T-shirt with assist to roll side to side to pull the shirt down over his back. 07/21/19 continues to require min assist at times for shirt/jacket    Time 12    Period Weeks    Status On-going    Target Date 12/15/19      OT LONG TERM GOAL #3   Title Pt. will require ModA to perform LE dressing    Baseline MaxA left, ModA with the right. Pt. has sores on his ankle, and feet. Pt.'s daughter is assisting.    Time 12    Period Weeks    Status Deferred    Target Date 12/15/19      OT LONG TERM GOAL #4   Title Pt. will improve left grip strength by 10# to  be able to open a jar/container.    Baseline Pt. continues to present with limited left hand grip strength. Pt. continues to work towards opening containers, and bottles.    Time 12    Period Weeks    Status On-going    Target Date 02/14/19      OT LONG TERM GOAL #5   Title Pt. will improve left hand [facility]  skills to be able to assist with buttoning/zipping.    Baseline Pt. is improving with manipulating the zipper on his jacket, however continues to require practice. Pt. continues to work on improving bilateral [facility] skills for buttoning/zipping.  07/21/19 improving with buttons and zippers.    Time 12    Period Weeks    Status On-going    Target Date 12/15/19      OT LONG TERM GOAL #6   Title Pt. will demonstrate visual conmpensatory strategies for 100% of the time during ADLs    Baseline Pt.continues to require intermitten cues cues for left sides awareness.    Time 12    Period Weeks    Status Partially Met    Target Date 12/15/19      OT LONG TERM GOAL #7   Title Pt. will prepare a simple cold snack from w/c with supervision using cognitive compensatory strategies 100% of the time.    Baseline Pt. has difficulty with getting the w/c close enough to access the refrigerator    Time 12    Period Weeks    Status Deferred      OT LONG TERM GOAL #8   Title Pt. will accurately identify potential safety hazard using good safety awareness, and judgement 100% for ADLs, and IADLs.    Baseline Pt. requires cues    Time 12    Period Weeks    Status On-going    Target Date 12/15/19      OT LONG TERM GOAL  #9   TITLE Pt. will  navigate his w/c around obstacles with Supervision and 100% accuracy    Baseline Pt. continues to require cuing for obstacles on the left, however requires less cues overall. Pt. requires significant cues to engage his left UE during tasks. 07/21/2019 continues to improve with decreased assist at times    Time 12    Period Weeks    Status On-going    Target Date  12/15/19      OT LONG TERM GOAL  #10   TITLE Pt. will independently be able  to reach to place items into cabinetry, and closets.    Baseline Pt. BUE strength is improving. UE functional reaching conitnues to be limited.    Time 12    Period Weeks    Status On-going    Target Date 12/15/19      OT LONG TERM GOAL  #11   TITLE Pt. will increase BUE strength by 44mm grades in preparation for ADL transfers.    Baseline Pt. continues to work towards improving overall strength,    Time 12    Period Weeks    Status On-going    Target Date 12/15/19      OT LONG TERM GOAL  #12   TITLE Pt. will improve LUE functional reaching with the left engaging the trunk 100% of the time during ADL tasks with minimal cues.    Baseline Pt. conitnues to improve with LUE functional reaching. Pt. conitnues to require cues to engage his trunk for extended reaching.    Time 12    Period Weeks    Status On-going    Target Date 12/15/19                 Plan - 10/27/19 1605    Clinical Impression Statement Pt. reports having had left hand pain over the weekend requiring a heat pad for relief. Pt. tolerated the session well today with the addition of the UBE,  which the pt. Reported enjoying.  Pt.continues to present with weakness and impaired motor control, and motor planning.Pt. tolerated BUE exercises well, with no reportsofdiscomfort. Pt. required consistent verbal, visual, and tactile cues to perform UE strengthening exercises, technique, and form as well as visual demonstration prior to the exercises.Pt. continues to work on improving BUE strength, and [facility] skills in order to improve LUE functioning during ADLs, IADLs, and tranfers   OT Occupational Profile and History Problem Focused Assessment - Including review of records relating to presenting problem    Occupational performance deficits (Please refer to evaluation for details): ADL's    Body Structure / Function / Physical Skills UE functional  use;Coordination;FMC;Dexterity;Strength;ROM    Cognitive Skills Attention;Memory;Emotional;Problem Solve;Safety Awareness    Rehab Potential Good    Clinical Decision Making Several treatment options, min-mod task modification necessary    Comorbidities Affecting Occupational Performance: Presence of comorbidities impacting occupational performance    Comorbidities impacting occupational performance description: Phyical, cognitive, visual,  medical comorbidities    Modification or Assistance to Complete Evaluation  No modification of tasks or assist necessary to complete eval    OT Frequency 2x / week    OT Duration 12 weeks    OT Treatment/Interventions Self-care/ADL training;DME and/or AE instruction;Therapeutic exercise;Therapeutic activities;Moist Heat;Cognitive remediation/compensation;Neuromuscular education;Visual/perceptual remediation/compensation;Coping strategies training;Patient/family education;Passive range of motion;Psychosocial skills training;Energy conservation;Functional Mobility Training    Consulted and Agree with Plan of Care Patient;Family member/caregiver           Patient will benefit from skilled therapeutic intervention in order to improve the following deficits and impairments:   Body Structure / Function / Physical Skills: UE functional use, Coordination, FMC, Dexterity, Strength, ROM Cognitive Skills: Attention, Memory, Emotional, Problem Solve, Safety Awareness     Visit Diagnosis: Muscle weakness (generalized)  Other lack of coordination    Problem List Patient Active Problem List   Diagnosis Date Noted  . Sepsis ([place]) 10/18/2018    Harrel Carina, MS, OTR/L 10/27/2019, 4:08 PM  [place] MAIN [facility] SERVICES [address] [place], [place], [address] Phone: [phone]   Fax:  [phone]  Name: Kenneth Summers MRN: [id number] Date of Birth: Apr 14, 1945

## 2019-10-29 ENCOUNTER — Ambulatory Visit: Payer: Medicare Other | Admitting: Physical Therapy

## 2019-10-30 ENCOUNTER — Encounter: Payer: Self-pay | Admitting: Occupational Therapy

## 2019-10-30 ENCOUNTER — Ambulatory Visit: Payer: Medicare Other | Admitting: Physical Therapy

## 2019-10-30 ENCOUNTER — Encounter: Payer: Self-pay | Admitting: Physical Therapy

## 2019-10-30 ENCOUNTER — Other Ambulatory Visit: Payer: Self-pay

## 2019-10-30 ENCOUNTER — Ambulatory Visit: Payer: Medicare Other | Admitting: Occupational Therapy

## 2019-10-30 DIAGNOSIS — M6281 Muscle weakness (generalized): Secondary | ICD-10-CM

## 2019-10-30 DIAGNOSIS — R262 Difficulty in walking, not elsewhere classified: Secondary | ICD-10-CM

## 2019-10-30 DIAGNOSIS — R278 Other lack of coordination: Secondary | ICD-10-CM

## 2019-10-30 NOTE — Therapy (Signed)
[place] MAIN [facility] SERVICES [address] [place], [place], [address] Phone: [phone]   Fax:  [phone]  Physical Therapy Treatment  Patient Details  Name: Kenneth Summers MRN: [id number] Date of Birth: Aug 14, 1945 No data recorded  Encounter Date: 10/30/2019   PT End of Session - 10/30/19 1347    Visit Number [id number]    Number of Visits 216    Date for PT Re-Evaluation 11/12/19    PT Start Time 1346    PT Stop Time 1430    PT Time Calculation (min) 44 min    Equipment Utilized During Treatment Gait belt    Activity Tolerance Patient tolerated treatment well;No increased pain    Behavior During Therapy WFL for tasks assessed/performed           Past Medical History:  Diagnosis Date  . Actinic keratosis   . Alcohol abuse   . APS (antiphospholipid syndrome) ([place])   . Basal cell carcinoma   . BPH (benign prostatic hyperplasia)   . CAD (coronary artery disease)   . Cellulitis   . Cervical spinal stenosis   . Chronic osteomyelitis ([place])   . CKD (chronic kidney disease)   . CKD (chronic kidney disease)   . Clostridium difficile diarrhea   . Depression   . Diplopia   . Fatigue   . GERD (gastroesophageal reflux disease)   . Hyperlipemia   . Hypertension   . Hypovitaminosis D   . IBS (irritable bowel syndrome)   . 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 Insomnia   . Left lumbosacral radiculopathy   . Moderate obstructive sleep apnea   . Osteomyelitis of foot ([place])   . PAD (peripheral artery disease) ([place])   . Pruritus   . RA (rheumatoid arthritis) ([place])   . Radiculopathy   . Restless legs syndrome   . RLS (restless legs syndrome)   . SLE (systemic lupus erythematosus related syndrome) ([place])   . 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 Stroke ([place])   . Toxic maculopathy of both eyes     Past Surgical History:  Procedure Laterality Date  . CARDIAC CATHETERIZATION    . CAROTID ENDARTERECTOMY    .  CERVICAL LAMINECTOMY    . CORONARY ANGIOPLASTY    . FRACTURE SURGERY    . fusion C5-6-7    . HERNIA REPAIR    . KNEE ARTHROSCOPY    . TONSILLECTOMY      There were no vitals filed for this visit.   Subjective Assessment - 10/30/19 1346    Subjective Patient reports doing well; Denies any pain; Reports that he was able to get the wheelchair on the [place] today without assistance;    Pertinent History Patient is a 74 y.o. male who presents to outpatient physical therapy with a referral for medical diagnosis of CVA. This patient's chief complaints consist of left hemiplegia and overall deconditioning leading to the following functional deficits: dependent for ADLs, IADLs, unable to transfer to car, dependent transfers at home with hoyer lift, unable to walk or stand without significant assistance, difficulty with W/C navigation..    Diagnostic tests Brain MRI 11/01/2017: IMPRESSION: 1. Acute right ACA territory infarct as demonstrated on prior CT imaging. No intracranial hemorrhage or significant mass effect. 2. 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    Patient Stated Goals wants to be able to walk again    Currently in Pain?  No/denies                TREATMENT: Prior to transfersand in between transfers: Patient sittingin wheelchair: -LLE only LAQx10 with visual/tactile cues for increased AROM -arms in lap, resting UE, erect posture,deep breaths x5 reps; Required min VCs and visual cues for better AROM to increase strengthening;  Ptrequires verbal and visual cues to improve ROM;He was able to exhibit improved LLE AROM with cues; Patient also requires cues for erect posture for better trunk control;Patient exhibits improved neutral position this session with less lateral trunk lean;  Following exercise: Transferred sit<>Stand in parallel bars (seat adjusted to27inch from floor height) X5 repswith CGAto close  supervision;herequiredmodVCs for forward weight shift for better trunk control and transfer ability; Patient able to exhibit better transfer ability with increased forward weight shiftwith less posterior loss of balance.   Patient required min Ato CGA for standing posture. He was able to exhibit less posterior lean but does require cues for increased hip/knee extension for better weight bearing in BLE in standing;  While standing: -mini squatx15reps with min A for safety; -RLE hip flexion marchx10reps, with min A and therapist blocking LLE knee; -Stepping forward/backward with RLE 2x5 reps with min A from therapist for blocking LLE knee and cues for hip extension; -Stepping forward/backward with LLE attempted but patient unable to clear foot;   -Attempted LLE hip flexion toe taps but patient unable to shift weight for foot clearance due to fatigue;  -sit<>stand from chair with min A for posture and weight shift x5 reps;   Patient requiredmodA to scoot back in chair at end of session due to increased fatigue    Response to treatment:Patient tolerated session well.He does fatigue quickly. Patient was able to exhibit better LLE AROM this session compared to previous sessions. He does exhibit increased posterior lean in standing due to decreased hip extension. Reinforced HEP                      PT Education - 10/30/19 1347    Education Details transfers, positioning, LE strengthening, HEP    Person(s) Educated Patient;Child(ren)    Methods Explanation;Verbal cues    Comprehension Verbalized understanding;Returned demonstration;Verbal cues required;Need further instruction            PT Short Term Goals - 08/18/19 1305      PT SHORT TERM GOAL #1   Title Be independent with initial home exercise program for self-management of symptoms.    Baseline HEP to be given at second session; advice to practice unsupported sitting at home (06/19/2018);  07/08/18: Performing at home, 6/29: needs assistance but is doing exercise program regularly; 08/22/18: adherent;    Time 2    Period Weeks    Status Achieved    Target Date 06/26/18             PT Long Term Goals - 10/02/19 1524      PT LONG TERM GOAL #1   Title Patient will complete all bed mobility with min A to improve functional independence for getting in and out of bed and adjusting in bed.     Baseline max A - total A reported by family (06/19/2018); 07/08/18: still requires maxA, dtr reports improvement in rolling since starting therapy; 6/23: min A to supervision in clinic; not doing at home right; 8/5: Pt's daughter states that his rolling has improved, but still has difficulty with all other bed mobility; 10/23/18: pts daughter states that she is  doing about 35% of the work if he is in proper position, he does uses the bed rails to help roll;  using Harrel Lemon for coming to sit EOB; 10/12: pts daughter states that she is doing about 30% of the work if he is in proper position, he does uses the bed rails to help roll, but it is improving;  using Hoyer for coming to sit EOB, 10/29: min A for sit to sidelying, rolling supervision, able to transition sidelying to sit with HHA;    Time 12    Period Weeks    Status Achieved    Target Date 12/04/18      PT LONG TERM GOAL #2   Title Patient will complete sit <> stand transfer chair to chair with Min A and LRAD to improve functional independence for household and community mobility.     Baseline supervision for STS, still requires elevated surface height to 27"    Time 12    Period Weeks    Status Achieved    Target Date 05/23/19      PT LONG TERM GOAL #3   Title Patient will navigate power w/c with min A x 100 feet to improve mobility for household and short community distances.    Baseline required total A (06/12/2018); able to roll 20 feet with min A (06/20/2018); 07/08/18: Pt can go approximately 10' without assist per family;  10/23/2018: pt's  daughter states that his power WC mobility has become 100% better with the new hand control, slowed speed, and with practice; 10/12: pt's daughter reports that he can perform household WC mobility and limited community ambulation with minA    Time 12    Period Weeks    Status Achieved    Target Date 05/23/19      PT LONG TERM GOAL #4   Title Patient will complete car transfers W/C to family SUV with min A using LRAD to improve his abilty to participate in community activities.     Baseline unable to complete car transfers at all (06/12/2018); unable to complete car transfers at all (06/19/2018).; 07/08/18: Unable to perform at this time; 09/11/18: unable to perform at this time; 10/23/2018: unable to complete car transfers at all; 10/12:  unable to complete car transfers at all, 10/29: unable; 01/13/19: unable; 2/11: min-mod A +2, 3/10: min A+1    Time 12    Period Weeks    Status Achieved    Target Date 05/23/19      PT LONG TERM GOAL #5   Title Patient will ambulate at least 10 feet with min A using LRAD to improve mobility for household and community distances.    Baseline unable to take steps (06/12/2018); able to shift R leg forward and back with max A and RW at edge of plinth (06/19/2018); 07/08/18: unable to ambulate at this time. 09/11/18: unable to perform at this time; 10/23/2018: unable to perform at this time; 10/12: unable to perform at this time, 10/29: unable at this time; 01/13/19: unable at this time, 2/11: min A to step with RLE, unable to step with LLE, 3/10: able to take 2 steps in parallel bars with min A for safety; 4/1: min A for walking 10 feet in parallel bars, 5/24: min A walking 10 feet in parallel bars; 6/21: no change 7/14: no change    Time 12    Period Weeks    Status Partially Met    Target Date 11/12/19      PT  LONG TERM GOAL #6   Title Patient will be able to safety navigate up/down ramp with power chair, exhibiting good safety awareness, mod I to safely enter/exit his home.     Baseline 2/11: supervision with intermittent min A;4/1: supervision; 5/24: supervision    Time 12    Period Weeks    Status Partially Met    Target Date 11/12/19      PT LONG TERM GOAL #7   Title Patient will increase functional reach test to >15 inches in sitting to exhibit improved sitting balance and positioning and reduce fall risk;    Baseline 07/30/18: 12 inches; 09/11/18: 10-12 inches; 10/23/18: 12-13 inch; 10/12: 14.5", 10/29: 15.5 inch    Time 4    Period Weeks    Status Achieved    Target Date 11/12/19      PT LONG TERM GOAL #8   Title Patient will increase LLE quad strength to 3+/5 to improve functional strength for standing and mobility;    Baseline 10/28: 2+/5; 01/13/19: 2+/5, 2/11: 3-/5, 04/16/19: 3-/5, 05/08/19: 3-/5, 5/24: 3-/5, 6/21: 3-/5, 7/14: 3-/5    Time 12    Period Weeks    Status Partially Met    Target Date 11/12/19      PT LONG TERM GOAL  #9   TITLE Patient will report no vertigo with provoking motions or positions in order to be able to perform ADLs and mobility tasks without symptoms.    Baseline 06/30/19: reports no dizziness in last week; 6/21: one episode this weekend; 7/14: experiencing one a week;    Time 12    Period Weeks    Status Partially Met    Target Date 11/12/19                 Plan - 10/30/19 1403    Clinical Impression Statement Patient motivated and participated well within session. He was instructed in proper positioning with LE movement. Patient is able to initiate LLE movement more but does require increased time for motor planning and control. He also requires cues for proper weight shift for better mobility. Patient does fatigue quickly requiring short seated rest breaks.Patient does require increased tactile and verbal cues for increased forward weight shift for better transfer ability; Patient would benefit from additional skilled PT Intervention to improve strength, balance and mobility;    Personal Factors and Comorbidities  Age;Comorbidity 3+    Comorbidities Relevant past medical history and comorbidities include long term steroid use for lupus, CKD, chronic osteomyelitis, cervical spine stenosis, BPH, APS, alcohol abuse, peripheral artery disease, depression, GERD, obstructive sleep apnea, HTN, IBS, IgA deficiency, lumbar radiculopathy, RA, Stroke, toxic maculopathy in both eyes, systemic lupus erythematosus related syndrome, cardiac catheterization, carotid endarterectomy, cervical laminectomy, coronary angioplasty, Fusion C5-C7, knee arthroplasty, lumbar surgery.    Examination-Activity Limitations Bed Mobility;Bend;Sit;Toileting;Stand;Stairs;Lift;Transfers;Squat;Locomotion Level;Carry;Dressing;Hygiene/Grooming;Continence    Examination-Participation Restrictions Yard Work;Interpersonal Relationship;Community Activity    Stability/Clinical Decision Making Evolving/Moderate complexity    Rehab Potential Fair    PT Frequency 3x / week    PT Duration 12 weeks    PT Treatment/Interventions ADLs/Self Care Home Management;Electrical Stimulation;Moist Heat;Cryotherapy;Gait training;Stair training;Functional mobility training;Therapeutic exercise;Balance training;Neuromuscular re-education;Cognitive remediation;Patient/family education;Orthotic Fit/Training;Wheelchair mobility training;Manual techniques;Passive range of motion;Energy conservation;Joint Manipulations;Canalith Repostioning;Therapeutic activities;Vestibular    PT Next Visit Plan Work on strength, sitting/standing balance, functional activities such as rolling, bed mobility, transfers; caregiver training    PT Home Exercise Plan no updates this session    Consulted and Agree with Plan of Care Patient;Family member/caregiver  Family Member Consulted DTR           Patient will benefit from skilled therapeutic intervention in order to improve the following deficits and impairments:  Abnormal gait, Decreased activity tolerance, Decreased cognition, Decreased  endurance, Decreased knowledge of use of DME, Decreased range of motion, Decreased skin integrity, Decreased strength, Impaired perceived functional ability, Impaired sensation, Impaired UE functional use, Improper body mechanics, Pain, Cardiopulmonary status limiting activity, Decreased balance, Decreased coordination, Decreased mobility, Difficulty walking, Impaired tone, Postural dysfunction, Dizziness  Visit Diagnosis: Muscle weakness (generalized)  Other lack of coordination  Difficulty in walking, not elsewhere classified     Problem List Patient Active Problem List   Diagnosis Date Noted  . Sepsis ([place]) 10/18/2018    Kyan Giannone PT, DPT 10/30/2019, 2:50 PM  [place] MAIN [facility] SERVICES [address] [place], [place], [address] Phone: [phone]   Fax:  [phone]  Name: Kenneth Summers MRN: [id number] Date of Birth: [birth year]

## 2019-10-30 NOTE — Therapy (Signed)
[place] MAIN [facility] SERVICES [address] [place], [place], [address] Phone: [phone]   Fax:  [phone]  Occupational Therapy Treatment  Patient Details  Name: Kenneth Summers MRN: [id number] Date of Birth: 07/16/1945 No data recorded  Encounter Date: 10/30/2019   OT End of Session - 10/30/19 1325    Visit Number 141    Number of Visits 157    Date for OT Re-Evaluation 12/15/19    Authorization Type Progress report period starting 07/21/2019    OT Start Time 1300    OT Stop Time 1345    OT Time Calculation (min) 45 min    Activity Tolerance Patient tolerated treatment well    Behavior During Therapy [facility] for tasks assessed/performed           Past Medical History:  Diagnosis Date  . Actinic keratosis   . Alcohol abuse   . APS (antiphospholipid syndrome) ([place])   . Basal cell carcinoma   . BPH (benign prostatic hyperplasia)   . CAD (coronary artery disease)   . Cellulitis   . Cervical spinal stenosis   . Chronic osteomyelitis ([place])   . CKD (chronic kidney disease)   . CKD (chronic kidney disease)   . Clostridium difficile diarrhea   . Depression   . Diplopia   . Fatigue   . GERD (gastroesophageal reflux disease)   . Hyperlipemia   . Hypertension   . Hypovitaminosis D   . IBS (irritable bowel syndrome)   . 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 Insomnia   . Left lumbosacral radiculopathy   . Moderate obstructive sleep apnea   . Osteomyelitis of foot ([place])   . PAD (peripheral artery disease) ([place])   . Pruritus   . RA (rheumatoid arthritis) ([place])   . Radiculopathy   . Restless legs syndrome   . RLS (restless legs syndrome)   . SLE (systemic lupus erythematosus related syndrome) ([place])   . 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 Stroke ([place])   . Toxic maculopathy of both eyes     Past Surgical History:  Procedure Laterality Date  . CARDIAC CATHETERIZATION    . CAROTID ENDARTERECTOMY    .  CERVICAL LAMINECTOMY    . CORONARY ANGIOPLASTY    . FRACTURE SURGERY    . fusion C5-6-7    . HERNIA REPAIR    . KNEE ARTHROSCOPY    . TONSILLECTOMY      There were no vitals filed for this visit.   Subjective Assessment - 10/30/19 1319    Subjective  Pt. reports that his left hand is feeling better this week.    Patient is accompanied by: Family member    Pertinent History Pt. is a 74 y.o. 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 Pt. PMHx includes: Multiple Falls, Lupus, DJD, Renal Abscess, CVA. Pt. resides with his wife. Pt.'s wife and daughter assist with ADLs. Pt. has caregivers in  for 2 hours a day, 6 days a week. Pt. received Rehab services in acute care, at SNF for STR, and [facility]. Pt. is retired from [employer] for [employer] and [employer].    Currently in Pain? No/denies    Pain Score 1     Pain Location Shoulder    Pain Orientation Left    Pain Descriptors / Indicators Aching           OT TREATMENT  Therapeutic Exercise:  Pt. performedBUE ther. ex with 2# dowel ex. For UE strengthening secondary to weakness. Bilateral shoulder flexion, chest press, circular patterns, and elbow flexion/extension were performedfor 1 set 10-20reps each.2# dumbbell ex. For bilateral elbow flexion and extension, forearm supination/pronation, right wrist flexion/extension, and radial deviation for 1 set 10-20 reps. Pt. Performed the UE UBE for reciprocal motion, strength, and activity tolerance. Pt. requires rest breaks and verbal cues for proper technique.  Pt.'s left hand pain has improved today. Pt. tolerated UE strengthening, and the UBE today. Pt.continues to present with weakness and impaired motor control, and motor planning.Pt. tolerated BUE exercises well, with no reportsofdiscomfort. Pt. required consistent verbal, visual, and tactile cues, however required less cues to perform UE strengthening exercises, technique, and form  as well as visual demonstration prior to the exercises.Pt. continues to work on improving BUE strength, and [facility] [place] skills in order to improve LUE functioning during ADLs, IADLs, and tranfers.                             OT Long Term Goals - 10/23/19 1320      OT LONG TERM GOAL #1   Title Pt. will increase left shoulder flexion AROM by 10 degrees to access cabinet/shelf.    Baseline Pt. continues to progress with shoulder ROM. Marland KitchenShouler flexion 130, and continues to work on reaching with increasing emphasis on flexing trunk to improve functional reach.  07/21/19- shoulder flexion to 130 with effort    Time 12    Period Weeks    Status Partially Met    Target Date 12/15/19      OT LONG TERM GOAL #2   Title Pt. will donn a shirt with Supervision.    Baseline Pt. continues to require MinA donning a zip down jacket with consistent cues to initiate the correct direction of the jacket.  MinA for donning a T-shirt with assist to roll side to side to pull the shirt down over his back. 07/21/19 continues to require min assist at times for shirt/jacket    Time 12    Period Weeks    Status On-going    Target Date 12/15/19      OT LONG TERM GOAL #3   Title Pt. will require ModA to perform LE dressing    Baseline MaxA left, ModA with the right. Pt. has sores on his ankle, and feet. Pt.'s daughter is assisting.    Time 12    Period Weeks    Status Deferred    Target Date 12/15/19      OT LONG TERM GOAL #4   Title Pt. will improve left grip strength by 10# to be able to open a jar/container.    Baseline Pt. continues to present with limited left hand grip strength. Pt. continues to work towards opening containers, and bottles.    Time 12    Period Weeks    Status On-going    Target Date 02/14/19      OT LONG TERM GOAL #5   Title Pt. will improve left hand [facility] skills to be able to assist with buttoning/zipping.    Baseline Pt. is improving with manipulating the zipper on  his jacket, however continues to require practice. Pt. continues to work on improving bilateral [facility] skills for buttoning/zipping.  07/21/19 improving with buttons and zippers.    Time 12    Period Weeks    Status On-going    Target  Date 12/15/19      OT LONG TERM GOAL #6   Title Pt. will demonstrate visual conmpensatory strategies for 100% of the time during ADLs    Baseline Pt.continues to require intermitten cues cues for left sides awareness.    Time 12    Period Weeks    Status Partially Met    Target Date 12/15/19      OT LONG TERM GOAL #7   Title Pt. will prepare a simple cold snack from w/c with supervision using cognitive compensatory strategies 100% of the time.    Baseline Pt. has difficulty with getting the w/c close enough to access the refrigerator    Time 12    Period Weeks    Status Deferred      OT LONG TERM GOAL #8   Title Pt. will accurately identify potential safety hazard using good safety awareness, and judgement 100% for ADLs, and IADLs.    Baseline Pt. requires cues    Time 12    Period Weeks    Status On-going    Target Date 12/15/19      OT LONG TERM GOAL  #9   TITLE Pt. will  navigate his w/c around obstacles with Supervision and 100% accuracy    Baseline Pt. continues to require cuing for obstacles on the left, however requires less cues overall. Pt. requires significant cues to engage his left UE during tasks. 07/21/2019 continues to improve with decreased assist at times    Time 12    Period Weeks    Status On-going    Target Date 12/15/19      OT LONG TERM GOAL  #10   TITLE Pt. will independently be able  to reach to place items into cabinetry, and closets.    Baseline Pt. BUE strength is improving. UE functional reaching conitnues to be limited.    Time 12    Period Weeks    Status On-going    Target Date 12/15/19      OT LONG TERM GOAL  #11   TITLE Pt. will increase BUE strength by 45m grades in preparation for ADL transfers.    Baseline  Pt. continues to work towards improving overall strength,    Time 12    Period Weeks    Status On-going    Target Date 12/15/19      OT LONG TERM GOAL  #12   TITLE Pt. will improve LUE functional reaching with the left engaging the trunk 100% of the time during ADL tasks with minimal cues.    Baseline Pt. conitnues to improve with LUE functional reaching. Pt. conitnues to require cues to engage his trunk for extended reaching.    Time 12    Period Weeks    Status On-going    Target Date 12/15/19                 Plan - 10/30/19 1326    Clinical Impression Statement Pt.'s left hand pain has improved today. Pt. tolerated UE strengthening, and the UBE today. Pt.continues to present with weakness and impaired motor control, and motor planning.Pt. tolerated BUE exercises well, with no reportsofdiscomfort. Pt. required consistent verbal, visual, and tactile cues, however required less cues to perform UE strengthening exercises, technique, and form as well as visual demonstration prior to the exercises.Pt. continues to work on improving BUE strength, and [facility] in order to improve LUE functioning during ADLs, IADLs, and tranfers.   OT Occupational Profile and History Problem  Focused Assessment - Including review of records relating to presenting problem    Occupational performance deficits (Please refer to evaluation for details): ADL's    Body Structure / Function / Physical Skills UE functional use;Coordination;FMC;Dexterity;Strength;ROM    Cognitive Skills Attention;Memory;Emotional;Problem Solve;Safety Awareness    Rehab Potential Good    Clinical Decision Making Several treatment options, min-mod task modification necessary    Comorbidities Affecting Occupational Performance: Presence of comorbidities impacting occupational performance    Comorbidities impacting occupational performance description: Phyical, cognitive, visual,  medical comorbidities    Modification or  Assistance to Complete Evaluation  No modification of tasks or assist necessary to complete eval    OT Frequency 2x / week    OT Duration 12 weeks    OT Treatment/Interventions Self-care/ADL training;DME and/or AE instruction;Therapeutic exercise;Therapeutic activities;Moist Heat;Cognitive remediation/compensation;Neuromuscular education;Visual/perceptual remediation/compensation;Coping strategies training;Patient/family education;Passive range of motion;Psychosocial skills training;Energy conservation;Functional Mobility Training    Consulted and Agree with Plan of Care Patient;Family member/caregiver    Family Member Consulted daughter [name]           Patient will benefit from skilled therapeutic intervention in order to improve the following deficits and impairments:   Body Structure / Function / Physical Skills: UE functional use, Coordination, FMC, Dexterity, Strength, ROM Cognitive Skills: Attention, Memory, Emotional, Problem Solve, Safety Awareness     Visit Diagnosis: Muscle weakness (generalized)    Problem List Patient Active Problem List   Diagnosis Date Noted  . Sepsis ([place]) 10/18/2018    Harrel Carina, MS, OTR/L 10/30/2019, 1:29 PM  [place] MAIN [facility] SERVICES [address] [place], [place], [address] Phone: [phone]   Fax:  [phone]  Name: Kenneth Summers MRN: [id number] Date of Birth: 1945/12/22

## 2019-11-03 ENCOUNTER — Other Ambulatory Visit: Payer: Self-pay

## 2019-11-03 ENCOUNTER — Ambulatory Visit: Payer: Medicare Other | Admitting: Physical Therapy

## 2019-11-03 ENCOUNTER — Encounter: Payer: Self-pay | Admitting: Physical Therapy

## 2019-11-03 ENCOUNTER — Ambulatory Visit: Payer: Medicare Other | Admitting: Occupational Therapy

## 2019-11-03 ENCOUNTER — Encounter: Payer: Self-pay | Admitting: Occupational Therapy

## 2019-11-03 DIAGNOSIS — R278 Other lack of coordination: Secondary | ICD-10-CM

## 2019-11-03 DIAGNOSIS — R262 Difficulty in walking, not elsewhere classified: Secondary | ICD-10-CM

## 2019-11-03 DIAGNOSIS — M6281 Muscle weakness (generalized): Secondary | ICD-10-CM | POA: Diagnosis not present

## 2019-11-03 DIAGNOSIS — I69354 Hemiplegia and hemiparesis following cerebral infarction affecting left non-dominant side: Secondary | ICD-10-CM

## 2019-11-03 DIAGNOSIS — R296 Repeated falls: Secondary | ICD-10-CM

## 2019-11-03 NOTE — Therapy (Signed)
[place] MAIN [facility] SERVICES [address] [place], [place], [address] Phone: [phone]   Fax:  [phone]  Physical Therapy Treatment  Patient Details  Name: Kenneth Summers MRN: [id number] Date of Birth: [birth year] No data recorded  Encounter Date: 11/03/2019   PT End of Session - 11/03/19 1310    Visit Number [id number]    Number of Visits 216    Date for PT Re-Evaluation 11/12/19    PT Start Time 1302    PT Stop Time 1345    PT Time Calculation (min) 43 min    Equipment Utilized During Treatment Gait belt    Activity Tolerance Patient tolerated treatment well;No increased pain    Behavior During Therapy WFL for tasks assessed/performed           Past Medical History:  Diagnosis Date  . Actinic keratosis   . Alcohol abuse   . APS (antiphospholipid syndrome) ([place])   . Basal cell carcinoma   . BPH (benign prostatic hyperplasia)   . CAD (coronary artery disease)   . Cellulitis   . Cervical spinal stenosis   . Chronic osteomyelitis ([place])   . CKD (chronic kidney disease)   . CKD (chronic kidney disease)   . Clostridium difficile diarrhea   . Depression   . Diplopia   . Fatigue   . GERD (gastroesophageal reflux disease)   . Hyperlipemia   . Hypertension   . Hypovitaminosis D   . IBS (irritable bowel syndrome)   . 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 Insomnia   . Left lumbosacral radiculopathy   . Moderate obstructive sleep apnea   . Osteomyelitis of foot ([place])   . PAD (peripheral artery disease) ([place])   . Pruritus   . RA (rheumatoid arthritis) ([place])   . Radiculopathy   . Restless legs syndrome   . RLS (restless legs syndrome)   . SLE (systemic lupus erythematosus related syndrome) ([place])   . 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 Stroke ([place])   . Toxic maculopathy of both eyes     Past Surgical History:  Procedure Laterality Date  . CARDIAC CATHETERIZATION    . CAROTID ENDARTERECTOMY    .  CERVICAL LAMINECTOMY    . CORONARY ANGIOPLASTY    . FRACTURE SURGERY    . fusion C5-6-7    . HERNIA REPAIR    . KNEE ARTHROSCOPY    . TONSILLECTOMY      There were no vitals filed for this visit.   Subjective Assessment - 11/03/19 1309    Subjective Patient reports doing well; He reports working on exercise over the weekend. Denies any pain;    Pertinent History Patient is a 74 y.o. male who presents to outpatient physical therapy with a referral for medical diagnosis of CVA. This patient's chief complaints consist of left hemiplegia and overall deconditioning leading to the following functional deficits: dependent for ADLs, IADLs, unable to transfer to car, dependent transfers at home with hoyer lift, unable to walk or stand without significant assistance, difficulty with W/C navigation..    Diagnostic tests Brain MRI 11/01/2017: IMPRESSION: 1. Acute right ACA territory infarct as demonstrated on prior CT imaging. No intracranial hemorrhage or significant mass effect. 2. 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    Patient Stated Goals wants to be able to walk again    Currently in Pain? No/denies  TREATMENT:  Transferred squat pivot wheelchair to [employer] A +2  Nustep: BUE/BLE level 3 x5 min with cues for steps per minute >60 (averaged 50) with total distance >0.12 miles Additional 2 min level 1 with BLE only with min A for LLE positioning to avoid hip abduction/ER;   Transferred squat pivot nustep to wheelchair mod A +2 with cues for hand placement  Ptrequires verbal and visual cues to improve ROM;He was able to exhibit improved LLE AROM with cues; Patient also requires cues for erect posture for better trunk control;  Following exercise: Attempted sit<>Stand transfers in parallel bars, Patient chair adjusted to 27 inch, however unable to stand, adjusted to taller sitting with still difficulty; Patient exhibits heavy left  lateral lean which limits transfer ability; He required max VCs and tactile cues for weight shift to right side for better neutral weight shift and to facilitate increased weight bearing in RLE.  PT elevated patient's chair, he was able to transfer sit<>Stand from elevated chair with min A +1 x2 reps; Patient required mod A for neutral weight shift with mod VCs and tactile cues for hip extension and erect standing posture. He was only able to stand for approximately 10 sec each stand due to fatigue;    Patient requiredmodA to scoot back in chair at end of session due to increased fatigue    Response to treatment:Patient tolerated session well.He does fatigue quickly. He was really tired after nustep but was able to exhibit better UE/LE movement. Patient did have increased difficulty with sit<>stand transfers following nustep due to fatigue                         PT Education - 11/03/19 1310    Education Details transfers/positioning, LE strengthening; HEP    Person(s) Educated Patient;Child(ren)    Methods Explanation;Verbal cues    Comprehension Verbalized understanding;Returned demonstration;Verbal cues required;Need further instruction            PT Short Term Goals - 08/18/19 1305      PT SHORT TERM GOAL #1   Title Be independent with initial home exercise program for self-management of symptoms.    Baseline HEP to be given at second session; advice to practice unsupported sitting at home (06/19/2018); 07/08/18: Performing at home, 6/29: needs assistance but is doing exercise program regularly; 08/22/18: adherent;    Time 2    Period Weeks    Status Achieved    Target Date 06/26/18             PT Long Term Goals - 10/02/19 1524      PT LONG TERM GOAL #1   Title Patient will complete all bed mobility with min A to improve functional independence for getting in and out of bed and adjusting in bed.     Baseline max A - total A reported by family  (06/19/2018); 07/08/18: still requires maxA, dtr reports improvement in rolling since starting therapy; 6/23: min A to supervision in clinic; not doing at home right; 8/5: Pt's daughter states that his rolling has improved, but still has difficulty with all other bed mobility; 10/23/18: pts daughter states that she is doing about 35% of the work if he is in proper position, he does uses the bed rails to help roll;  using Harrel Lemon for coming to sit EOB; 10/12: pts daughter states that she is doing about 30% of the work if he is in proper position, he does uses  the bed rails to help roll, but it is improving;  using Hoyer for coming to sit EOB, 10/29: min A for sit to sidelying, rolling supervision, able to transition sidelying to sit with HHA;    Time 12    Period Weeks    Status Achieved    Target Date 12/04/18      PT LONG TERM GOAL #2   Title Patient will complete sit <> stand transfer chair to chair with Min A and LRAD to improve functional independence for household and community mobility.     Baseline supervision for STS, still requires elevated surface height to 27"    Time 12    Period Weeks    Status Achieved    Target Date 05/23/19      PT LONG TERM GOAL #3   Title Patient will navigate power w/c with min A x 100 feet to improve mobility for household and short community distances.    Baseline required total A (06/12/2018); able to roll 20 feet with min A (06/20/2018); 07/08/18: Pt can go approximately 10' without assist per family;  10/23/2018: pt's daughter states that his power WC mobility has become 100% better with the new hand control, slowed speed, and with practice; 10/12: pt's daughter reports that he can perform household WC mobility and limited community ambulation with minA    Time 12    Period Weeks    Status Achieved    Target Date 05/23/19      PT LONG TERM GOAL #4   Title Patient will complete car transfers W/C to family SUV with min A using LRAD to improve his abilty to  participate in community activities.     Baseline unable to complete car transfers at all (06/12/2018); unable to complete car transfers at all (06/19/2018).; 07/08/18: Unable to perform at this time; 09/11/18: unable to perform at this time; 10/23/2018: unable to complete car transfers at all; 10/12:  unable to complete car transfers at all, 10/29: unable; 01/13/19: unable; 2/11: min-mod A +2, 3/10: min A+1    Time 12    Period Weeks    Status Achieved    Target Date 05/23/19      PT LONG TERM GOAL #5   Title Patient will ambulate at least 10 feet with min A using LRAD to improve mobility for household and community distances.    Baseline unable to take steps (06/12/2018); able to shift R leg forward and back with max A and RW at edge of plinth (06/19/2018); 07/08/18: unable to ambulate at this time. 09/11/18: unable to perform at this time; 10/23/2018: unable to perform at this time; 10/12: unable to perform at this time, 10/29: unable at this time; 01/13/19: unable at this time, 2/11: min A to step with RLE, unable to step with LLE, 3/10: able to take 2 steps in parallel bars with min A for safety; 4/1: min A for walking 10 feet in parallel bars, 5/24: min A walking 10 feet in parallel bars; 6/21: no change 7/14: no change    Time 12    Period Weeks    Status Partially Met    Target Date 11/12/19      PT LONG TERM GOAL #6   Title Patient will be able to safety navigate up/down ramp with power chair, exhibiting good safety awareness, mod I to safely enter/exit his home.    Baseline 2/11: supervision with intermittent min A;4/1: supervision; 5/24: supervision    Time 12  Period Weeks    Status Partially Met    Target Date 11/12/19      PT LONG TERM GOAL #7   Title Patient will increase functional reach test to >15 inches in sitting to exhibit improved sitting balance and positioning and reduce fall risk;    Baseline 07/30/18: 12 inches; 09/11/18: 10-12 inches; 10/23/18: 12-13 inch; 10/12: 14.5", 10/29: 15.5  inch    Time 4    Period Weeks    Status Achieved    Target Date 11/12/19      PT LONG TERM GOAL #8   Title Patient will increase LLE quad strength to 3+/5 to improve functional strength for standing and mobility;    Baseline 10/28: 2+/5; 01/13/19: 2+/5, 2/11: 3-/5, 04/16/19: 3-/5, 05/08/19: 3-/5, 5/24: 3-/5, 6/21: 3-/5, 7/14: 3-/5    Time 12    Period Weeks    Status Partially Met    Target Date 11/12/19      PT LONG TERM GOAL  #9   TITLE Patient will report no vertigo with provoking motions or positions in order to be able to perform ADLs and mobility tasks without symptoms.    Baseline 06/30/19: reports no dizziness in last week; 6/21: one episode this weekend; 7/14: experiencing one a week;    Time 12    Period Weeks    Status Partially Met    Target Date 11/12/19                 Plan - 11/03/19 1332    Clinical Impression Statement Patient motivated and participated well within session. He was instructed in Nustep activity for UE/LE strengthening with fair tolerance. He does exhibit increased left lateral lean with increased fatigue. Patient had difficulty finding midline with sit<>stand transfers in parallel bars as a result to fatigue. He did require min A +1 for sit<>stand transfers with increased blocking to LLE and cues for forward weight shift and increased hip extension. Patient would benefit from additional skilled PT Intervention to improve strength and mobility.    Personal Factors and Comorbidities Age;Comorbidity 3+    Comorbidities Relevant past medical history and comorbidities include long term steroid use for lupus, CKD, chronic osteomyelitis, cervical spine stenosis, BPH, APS, alcohol abuse, peripheral artery disease, depression, GERD, obstructive sleep apnea, HTN, IBS, IgA deficiency, lumbar radiculopathy, RA, Stroke, toxic maculopathy in both eyes, systemic lupus erythematosus related syndrome, cardiac catheterization, carotid endarterectomy, cervical laminectomy,  coronary angioplasty, Fusion C5-C7, knee arthroplasty, lumbar surgery.    Examination-Activity Limitations Bed Mobility;Bend;Sit;Toileting;Stand;Stairs;Lift;Transfers;Squat;Locomotion Level;Carry;Dressing;Hygiene/Grooming;Continence    Examination-Participation Restrictions Yard Work;Interpersonal Relationship;Community Activity    Stability/Clinical Decision Making Evolving/Moderate complexity    Rehab Potential Fair    PT Frequency 3x / week    PT Duration 12 weeks    PT Treatment/Interventions ADLs/Self Care Home Management;Electrical Stimulation;Moist Heat;Cryotherapy;Gait training;Stair training;Functional mobility training;Therapeutic exercise;Balance training;Neuromuscular re-education;Cognitive remediation;Patient/family education;Orthotic Fit/Training;Wheelchair mobility training;Manual techniques;Passive range of motion;Energy conservation;Joint Manipulations;Canalith Repostioning;Therapeutic activities;Vestibular    PT Next Visit Plan Work on strength, sitting/standing balance, functional activities such as rolling, bed mobility, transfers; caregiver training    PT Home Exercise Plan no updates this session    Consulted and Agree with Plan of Care Patient;Family member/caregiver    Family Member Consulted DTR           Patient will benefit from skilled therapeutic intervention in order to improve the following deficits and impairments:  Abnormal gait, Decreased activity tolerance, Decreased cognition, Decreased endurance, Decreased knowledge of use of DME, Decreased range of motion,  Decreased skin integrity, Decreased strength, Impaired perceived functional ability, Impaired sensation, Impaired UE functional use, Improper body mechanics, Pain, Cardiopulmonary status limiting activity, Decreased balance, Decreased coordination, Decreased mobility, Difficulty walking, Impaired tone, Postural dysfunction, Dizziness  Visit Diagnosis: Muscle weakness (generalized)  Other lack of  coordination  Difficulty in walking, not elsewhere classified  Hemiplegia and hemiparesis following cerebral infarction affecting left non-dominant side (HCC)  Repeated falls     Problem List Patient Active Problem List   Diagnosis Date Noted  . Sepsis ([place]) 10/18/2018    Doretha Goding 11/03/2019, 2:30 PM  [place] MAIN [facility] SERVICES [address] [place], [place], [address] Phone: [phone]   Fax:  [phone]  Name: Kenneth Summers MRN: [id number] Date of Birth: 08/12/1945

## 2019-11-03 NOTE — Therapy (Signed)
[place] MAIN [facility] SERVICES [address] [place], [place], [address] Phone: [phone]   Fax:  [phone]  Occupational Therapy Treatment  Patient Details  Name: Kenneth Summers MRN: [id number] Date of Birth: March 16, 1945 No data recorded  Encounter Date: 11/03/2019   OT End of Session - 11/03/19 1406    Visit Number 142    Number of Visits 157    Date for OT Re-Evaluation 12/15/19    Authorization Type Progress report period starting 07/21/2019    OT Start Time 1347    OT Stop Time 1430    OT Time Calculation (min) 43 min    Activity Tolerance Patient limited by pain    Behavior During Therapy [facility] for tasks assessed/performed           Past Medical History:  Diagnosis Date  . Actinic keratosis   . Alcohol abuse   . APS (antiphospholipid syndrome) ([place])   . Basal cell carcinoma   . BPH (benign prostatic hyperplasia)   . CAD (coronary artery disease)   . Cellulitis   . Cervical spinal stenosis   . Chronic osteomyelitis ([place])   . CKD (chronic kidney disease)   . CKD (chronic kidney disease)   . Clostridium difficile diarrhea   . Depression   . Diplopia   . Fatigue   . GERD (gastroesophageal reflux disease)   . Hyperlipemia   . Hypertension   . Hypovitaminosis D   . IBS (irritable bowel syndrome)   . 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 Insomnia   . Left lumbosacral radiculopathy   . Moderate obstructive sleep apnea   . Osteomyelitis of foot ([place])   . PAD (peripheral artery disease) ([place])   . Pruritus   . RA (rheumatoid arthritis) ([place])   . Radiculopathy   . Restless legs syndrome   . RLS (restless legs syndrome)   . SLE (systemic lupus erythematosus related syndrome) ([place])   . 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 Stroke ([place])   . Toxic maculopathy of both eyes     Past Surgical History:  Procedure Laterality Date  . CARDIAC CATHETERIZATION    . CAROTID ENDARTERECTOMY    . CERVICAL  LAMINECTOMY    . CORONARY ANGIOPLASTY    . FRACTURE SURGERY    . fusion C5-6-7    . HERNIA REPAIR    . KNEE ARTHROSCOPY    . TONSILLECTOMY      There were no vitals filed for this visit.   Subjective Assessment - 11/03/19 1405    Subjective  Pt. reports that his left hand is feeling better this week.    Patient is accompanied by: Family member    Pertinent History Pt. is a 74 y.o. 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 Pt. PMHx includes: Multiple Falls, Lupus, DJD, Renal Abscess, CVA. Pt. resides with his wife. Pt.'s wife and daughter assist with ADLs. Pt. has caregivers in  for 2 hours a day, 6 days a week. Pt. received Rehab services in acute care, at SNF for STR, and [place] services. Pt. is retired from [employer] for [employer] and [employer].    Currently in Pain? No/denies            OT TREATMENT   Therapeutic Exercise:  Pt. performed BUE strengthening with red theraband for right shoulder flexion, abduction, bilateral elbow flexion, and extension. Pt. performed the UE  UBE for reciprocal motion, strength, and activity tolerance for 5 min. Pt. requires rest breaks and verbal cues for proper technique. Pt. required left lateral support during then UBE.  Pt. with no reports of pain today. Pt. Reports fatigue following PT today. Pt. Required increased assist and lateral trunk support during the exercises secondary to left lateral leaning. Pt. tolerated UE strengthening, and the UBE today.Pt.continues to present with weakness and impaired motor control, and motor planning.Pt. tolerated BUE exercises well, with no reportsofdiscomfort. Pt. required consistent verbal, visual, and tactile cues, however required less cues to perform UE strengthening exercises, technique, and formas well as visual demonstration prior to the exercises.Pt. continues to work on improving BUE strength, and [facility] skills in order to improve LUE functioning during  ADLs, IADLs, and tranfers.                      OT Education - 11/03/19 1406    Education Details BUE Therapeutic ex    Person(s) Educated Patient    Methods Explanation;Demonstration    Comprehension Returned demonstration;Tactile cues required;Verbalized understanding;Need further instruction               OT Long Term Goals - 10/23/19 1320      OT LONG TERM GOAL #1   Title Pt. will increase left shoulder flexion AROM by 10 degrees to access cabinet/shelf.    Baseline Pt. continues to progress with shoulder ROM. Marland KitchenShouler flexion 130, and continues to work on reaching with increasing emphasis on flexing trunk to improve functional reach.  07/21/19- shoulder flexion to 130 with effort    Time 12    Period Weeks    Status Partially Met    Target Date 12/15/19      OT LONG TERM GOAL #2   Title Pt. will donn a shirt with Supervision.    Baseline Pt. continues to require MinA donning a zip down jacket with consistent cues to initiate the correct direction of the jacket.  MinA for donning a T-shirt with assist to roll side to side to pull the shirt down over his back. 07/21/19 continues to require min assist at times for shirt/jacket    Time 12    Period Weeks    Status On-going    Target Date 12/15/19      OT LONG TERM GOAL #3   Title Pt. will require ModA to perform LE dressing    Baseline MaxA left, ModA with the right. Pt. has sores on his ankle, and feet. Pt.'s daughter is assisting.    Time 12    Period Weeks    Status Deferred    Target Date 12/15/19      OT LONG TERM GOAL #4   Title Pt. will improve left grip strength by 10# to be able to open a jar/container.    Baseline Pt. continues to present with limited left hand grip strength. Pt. continues to work towards opening containers, and bottles.    Time 12    Period Weeks    Status On-going    Target Date 02/14/19      OT LONG TERM GOAL #5   Title Pt. will improve left hand [facility] skills to be able  to assist with buttoning/zipping.    Baseline Pt. is improving with manipulating the zipper on his jacket, however continues to require practice. Pt. continues to work on improving bilateral [facility] skills for buttoning/zipping.  07/21/19 improving with buttons and zippers.    Time  12    Period Weeks    Status On-going    Target Date 12/15/19      OT LONG TERM GOAL #6   Title Pt. will demonstrate visual conmpensatory strategies for 100% of the time during ADLs    Baseline Pt.continues to require intermitten cues cues for left sides awareness.    Time 12    Period Weeks    Status Partially Met    Target Date 12/15/19      OT LONG TERM GOAL #7   Title Pt. will prepare a simple cold snack from w/c with supervision using cognitive compensatory strategies 100% of the time.    Baseline Pt. has difficulty with getting the w/c close enough to access the refrigerator    Time 12    Period Weeks    Status Deferred      OT LONG TERM GOAL #8   Title Pt. will accurately identify potential safety hazard using good safety awareness, and judgement 100% for ADLs, and IADLs.    Baseline Pt. requires cues    Time 12    Period Weeks    Status On-going    Target Date 12/15/19      OT LONG TERM GOAL  #9   TITLE Pt. will  navigate his w/c around obstacles with Supervision and 100% accuracy    Baseline Pt. continues to require cuing for obstacles on the left, however requires less cues overall. Pt. requires significant cues to engage his left UE during tasks. 07/21/2019 continues to improve with decreased assist at times    Time 12    Period Weeks    Status On-going    Target Date 12/15/19      OT LONG TERM GOAL  #10   TITLE Pt. will independently be able  to reach to place items into cabinetry, and closets.    Baseline Pt. BUE strength is improving. UE functional reaching conitnues to be limited.    Time 12    Period Weeks    Status On-going    Target Date 12/15/19      OT LONG TERM GOAL  #11    TITLE Pt. will increase BUE strength by 57m grades in preparation for ADL transfers.    Baseline Pt. continues to work towards improving overall strength,    Time 12    Period Weeks    Status On-going    Target Date 12/15/19      OT LONG TERM GOAL  #12   TITLE Pt. will improve LUE functional reaching with the left engaging the trunk 100% of the time during ADL tasks with minimal cues.    Baseline Pt. conitnues to improve with LUE functional reaching. Pt. conitnues to require cues to engage his trunk for extended reaching.    Time 12    Period Weeks    Status On-going    Target Date 12/15/19                 Plan - 11/03/19 1406    Clinical Impression Statement Pt. with no reports of pain today. Pt. Reports fatigue following PT today. Pt. Required increased assist and lateral trunk support during the exercises secondary to left lateral leaning. Pt. tolerated UE strengthening, and the UBE today.Pt.continues to present with weakness and impaired motor control, and motor planning.Pt. tolerated BUE exercises well, with no reportsofdiscomfort. Pt. required consistent verbal, visual, and tactile cues, however required less cues to perform UE strengthening exercises, technique, and formas well as  visual demonstration prior to the exercises.Pt. continues to work on improving BUE strength, and [facility] skills in order to improve LUE functioning during ADLs, IADLs, and tranfers.   OT Occupational Profile and History Problem Focused Assessment - Including review of records relating to presenting problem    Occupational performance deficits (Please refer to evaluation for details): ADL's    Body Structure / Function / Physical Skills UE functional use;Coordination;FMC;Dexterity;Strength;ROM    Cognitive Skills Attention;Memory;Emotional;Problem Solve;Safety Awareness    Rehab Potential Good    Clinical Decision Making Several treatment options, min-mod task modification necessary    Comorbidities  Affecting Occupational Performance: Presence of comorbidities impacting occupational performance    Comorbidities impacting occupational performance description: Phyical, cognitive, visual,  medical comorbidities    Modification or Assistance to Complete Evaluation  No modification of tasks or assist necessary to complete eval    OT Frequency 2x / week    OT Duration 12 weeks    OT Treatment/Interventions Self-care/ADL training;DME and/or AE instruction;Therapeutic exercise;Therapeutic activities;Moist Heat;Cognitive remediation/compensation;Neuromuscular education;Visual/perceptual remediation/compensation;Coping strategies training;Patient/family education;Passive range of motion;Psychosocial skills training;Energy conservation;Functional Mobility Training    Consulted and Agree with Plan of Care Patient;Family member/caregiver    Family Member Consulted daughter [name]           Patient will benefit from skilled therapeutic intervention in order to improve the following deficits and impairments:   Body Structure / Function / Physical Skills: UE functional use, Coordination, FMC, Dexterity, Strength, ROM Cognitive Skills: Attention, Memory, Emotional, Problem Solve, Safety Awareness     Visit Diagnosis: Muscle weakness (generalized)  Other lack of coordination    Problem List Patient Active Problem List   Diagnosis Date Noted  . Sepsis ([place]) 10/18/2018    Harrel Carina, MS, OTR/L 11/03/2019, 2:11 PM  [place] MAIN [facility] SERVICES [address] [place], [place], [address] Phone: [phone]   Fax:  [phone]  Name: Kenneth Summers MRN: [id number] Date of Birth: [birth year]

## 2019-11-05 ENCOUNTER — Ambulatory Visit: Payer: Medicare Other | Admitting: Physical Therapy

## 2019-11-06 ENCOUNTER — Other Ambulatory Visit: Payer: Self-pay

## 2019-11-06 ENCOUNTER — Encounter: Payer: Self-pay | Admitting: Physical Therapy

## 2019-11-06 ENCOUNTER — Encounter: Payer: Self-pay | Admitting: Occupational Therapy

## 2019-11-06 ENCOUNTER — Ambulatory Visit: Payer: Medicare Other | Admitting: Occupational Therapy

## 2019-11-06 ENCOUNTER — Ambulatory Visit: Payer: Medicare Other | Admitting: Physical Therapy

## 2019-11-06 DIAGNOSIS — R278 Other lack of coordination: Secondary | ICD-10-CM

## 2019-11-06 DIAGNOSIS — M6281 Muscle weakness (generalized): Secondary | ICD-10-CM

## 2019-11-06 DIAGNOSIS — R262 Difficulty in walking, not elsewhere classified: Secondary | ICD-10-CM

## 2019-11-06 DIAGNOSIS — I69354 Hemiplegia and hemiparesis following cerebral infarction affecting left non-dominant side: Secondary | ICD-10-CM

## 2019-11-06 NOTE — Therapy (Signed)
[place] MAIN [facility] SERVICES [address] [place], [place], [address] Phone: [phone]   Fax:  [phone]  Physical Therapy Treatment  Patient Details  Name: Kenneth Summers MRN: [id number] Date of Birth: 1945/07/04 No data recorded  Encounter Date: 11/06/2019   PT End of Session - 11/06/19 1318    Visit Number [id number]    Number of Visits 216    Date for PT Re-Evaluation 11/12/19    PT Start Time 1346    PT Stop Time 1430    PT Time Calculation (min) 44 min    Equipment Utilized During Treatment Gait belt    Activity Tolerance Patient tolerated treatment well;No increased pain    Behavior During Therapy WFL for tasks assessed/performed           Past Medical History:  Diagnosis Date  . Actinic keratosis   . Alcohol abuse   . APS (antiphospholipid syndrome) ([place])   . Basal cell carcinoma   . BPH (benign prostatic hyperplasia)   . CAD (coronary artery disease)   . Cellulitis   . Cervical spinal stenosis   . Chronic osteomyelitis ([place])   . CKD (chronic kidney disease)   . CKD (chronic kidney disease)   . Clostridium difficile diarrhea   . Depression   . Diplopia   . Fatigue   . GERD (gastroesophageal reflux disease)   . Hyperlipemia   . Hypertension   . Hypovitaminosis D   . IBS (irritable bowel syndrome)   . 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 Insomnia   . Left lumbosacral radiculopathy   . Moderate obstructive sleep apnea   . Osteomyelitis of foot ([place])   . PAD (peripheral artery disease) ([place])   . Pruritus   . RA (rheumatoid arthritis) ([place])   . Radiculopathy   . Restless legs syndrome   . RLS (restless legs syndrome)   . SLE (systemic lupus erythematosus related syndrome) ([place])   . 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 Stroke ([place])   . Toxic maculopathy of both eyes     Past Surgical History:  Procedure Laterality Date  . CARDIAC CATHETERIZATION    . CAROTID ENDARTERECTOMY    .  CERVICAL LAMINECTOMY    . CORONARY ANGIOPLASTY    . FRACTURE SURGERY    . fusion C5-6-7    . HERNIA REPAIR    . KNEE ARTHROSCOPY    . TONSILLECTOMY      There were no vitals filed for this visit.   Subjective Assessment - 11/06/19 1350    Subjective Patient reports sliding out of power chair yesterday. He reports the fire department was able to help get him up; He reports he is having a hard time moving his left leg today and feels tired;    Pertinent History Patient is a 74 y.o. male who presents to outpatient physical therapy with a referral for medical diagnosis of CVA. This patient's chief complaints consist of left hemiplegia and overall deconditioning leading to the following functional deficits: dependent for ADLs, IADLs, unable to transfer to car, dependent transfers at home with hoyer lift, unable to walk or stand without significant assistance, difficulty with W/C navigation..    Diagnostic tests Brain MRI 11/01/2017: IMPRESSION: 1. Acute right ACA territory infarct as demonstrated on prior CT imaging. No intracranial hemorrhage or significant mass effect. 2. 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    Patient Stated  Goals wants to be able to walk again    Currently in Pain? No/denies                  TREATMENT: Prior to transfersand in between transfers: Patient sittingin wheelchair: -hip flexion march x15 reps bilaterally;  -LLE only LAQx10with visual/tactile cues for increased AROM -arms in lap, resting UE, erect posture,deep breaths x5 reps; Required min VCs and visual cues for better AROM to increase strengthening;  Ptrequires verbal and visual cues to improve ROM;He was able to exhibit improved LLE AROM with cues; Patient also requires cues for erect posture for better trunk control;Patient exhibits improved neutral position this session with less lateral trunk lean;  Following exercise: Transferred sit<>Stand  in parallel bars (seat adjusted to27inch from floor height) X7repswith CGAto close supervision;herequiredmodVCs for forward weight shift for better trunk control and transfer ability; Patient able to exhibit better transfer ability with increased forward weight shiftwith less posterior loss of balance.   Patient required min Ato CGA for standing posture. He was able to exhibit less posterior lean but does require cues for increased hip/knee extension for better weight bearing in BLE in standing;  While standing: -mini squat2x15reps with min A for safety; -RLE hip flexion marchx15reps, with min A and therapist blocking LLE knee; -LLE hip flexion march, partial ROM x10 reps with min A for hip extension and therapist blocking RLE knee for better stance control   Patient requiredmodA to scoot back in chair at end of session due to increased fatigue                      PT Education - 11/06/19 1318    Education Details transfers/positioning, LE strengthening, HEP    Person(s) Educated Patient;Child(ren)    Methods Explanation;Demonstration;Verbal cues    Comprehension Verbalized understanding;Returned demonstration;Verbal cues required;Need further instruction            PT Short Term Goals - 08/18/19 1305      PT SHORT TERM GOAL #1   Title Be independent with initial home exercise program for self-management of symptoms.    Baseline HEP to be given at second session; advice to practice unsupported sitting at home (06/19/2018); 07/08/18: Performing at home, 6/29: needs assistance but is doing exercise program regularly; 08/22/18: adherent;    Time 2    Period Weeks    Status Achieved    Target Date 06/26/18             PT Long Term Goals - 10/02/19 1524      PT LONG TERM GOAL #1   Title Patient will complete all bed mobility with min A to improve functional independence for getting in and out of bed and adjusting in bed.     Baseline max A  - total A reported by family (06/19/2018); 07/08/18: still requires maxA, dtr reports improvement in rolling since starting therapy; 6/23: min A to supervision in clinic; not doing at home right; 8/5: Pt's daughter states that his rolling has improved, but still has difficulty with all other bed mobility; 10/23/18: pts daughter states that she is doing about 35% of the work if he is in proper position, he does uses the bed rails to help roll;  using Hoyer for coming to sit EOB; 10/12: pts daughter states that she is doing about 30% of the work if he is in proper position, he does uses the bed rails to help roll, but it is improving;  using  Hoyer for coming to sit EOB, 10/29: min A for sit to sidelying, rolling supervision, able to transition sidelying to sit with HHA;    Time 12    Period Weeks    Status Achieved    Target Date 12/04/18      PT LONG TERM GOAL #2   Title Patient will complete sit <> stand transfer chair to chair with Min A and LRAD to improve functional independence for household and community mobility.     Baseline supervision for STS, still requires elevated surface height to 27"    Time 12    Period Weeks    Status Achieved    Target Date 05/23/19      PT LONG TERM GOAL #3   Title Patient will navigate power w/c with min A x 100 feet to improve mobility for household and short community distances.    Baseline required total A (06/12/2018); able to roll 20 feet with min A (06/20/2018); 07/08/18: Pt can go approximately 10' without assist per family;  10/23/2018: pt's daughter states that his power WC mobility has become 100% better with the new hand control, slowed speed, and with practice; 10/12: pt's daughter reports that he can perform household WC mobility and limited community ambulation with minA    Time 12    Period Weeks    Status Achieved    Target Date 05/23/19      PT LONG TERM GOAL #4   Title Patient will complete car transfers W/C to family SUV with min A using LRAD to  improve his abilty to participate in community activities.     Baseline unable to complete car transfers at all (06/12/2018); unable to complete car transfers at all (06/19/2018).; 07/08/18: Unable to perform at this time; 09/11/18: unable to perform at this time; 10/23/2018: unable to complete car transfers at all; 10/12:  unable to complete car transfers at all, 10/29: unable; 01/13/19: unable; 2/11: min-mod A +2, 3/10: min A+1    Time 12    Period Weeks    Status Achieved    Target Date 05/23/19      PT LONG TERM GOAL #5   Title Patient will ambulate at least 10 feet with min A using LRAD to improve mobility for household and community distances.    Baseline unable to take steps (06/12/2018); able to shift R leg forward and back with max A and RW at edge of plinth (06/19/2018); 07/08/18: unable to ambulate at this time. 09/11/18: unable to perform at this time; 10/23/2018: unable to perform at this time; 10/12: unable to perform at this time, 10/29: unable at this time; 01/13/19: unable at this time, 2/11: min A to step with RLE, unable to step with LLE, 3/10: able to take 2 steps in parallel bars with min A for safety; 4/1: min A for walking 10 feet in parallel bars, 5/24: min A walking 10 feet in parallel bars; 6/21: no change 7/14: no change    Time 12    Period Weeks    Status Partially Met    Target Date 11/12/19      PT LONG TERM GOAL #6   Title Patient will be able to safety navigate up/down ramp with power chair, exhibiting good safety awareness, mod I to safely enter/exit his home.    Baseline 2/11: supervision with intermittent min A;4/1: supervision; 5/24: supervision    Time 12    Period Weeks    Status Partially Met    Target  Date 11/12/19      PT LONG TERM GOAL #7   Title Patient will increase functional reach test to >15 inches in sitting to exhibit improved sitting balance and positioning and reduce fall risk;    Baseline 07/30/18: 12 inches; 09/11/18: 10-12 inches; 10/23/18: 12-13 inch; 10/12:  14.5", 10/29: 15.5 inch    Time 4    Period Weeks    Status Achieved    Target Date 11/12/19      PT LONG TERM GOAL #8   Title Patient will increase LLE quad strength to 3+/5 to improve functional strength for standing and mobility;    Baseline 10/28: 2+/5; 01/13/19: 2+/5, 2/11: 3-/5, 04/16/19: 3-/5, 05/08/19: 3-/5, 5/24: 3-/5, 6/21: 3-/5, 7/14: 3-/5    Time 12    Period Weeks    Status Partially Met    Target Date 11/12/19      PT LONG TERM GOAL  #9   TITLE Patient will report no vertigo with provoking motions or positions in order to be able to perform ADLs and mobility tasks without symptoms.    Baseline 06/30/19: reports no dizziness in last week; 6/21: one episode this weekend; 7/14: experiencing one a week;    Time 12    Period Weeks    Status Partially Met    Target Date 11/12/19                 Plan - 11/06/19 1417    Clinical Impression Statement Patient motivated and participated well within session. He was able to exhibit better LLE movement this session including kicking foot on foot plate with better motor control. He was able to tolerate increased repetition with LE movement. Patient does fatigue with prolonged standing requiring short seated rest breaks. He was able to exhibit better forward lean and transfer ability. Patient does require CGA to min A with standing especially with cues for erect posture and hip extension. He would benefit from additional skilled PT Intervention to improve strength, balance and mobility;    Personal Factors and Comorbidities Age;Comorbidity 3+    Comorbidities Relevant past medical history and comorbidities include long term steroid use for lupus, CKD, chronic osteomyelitis, cervical spine stenosis, BPH, APS, alcohol abuse, peripheral artery disease, depression, GERD, obstructive sleep apnea, HTN, IBS, IgA deficiency, lumbar radiculopathy, RA, Stroke, toxic maculopathy in both eyes, systemic lupus erythematosus related syndrome, cardiac  catheterization, carotid endarterectomy, cervical laminectomy, coronary angioplasty, Fusion C5-C7, knee arthroplasty, lumbar surgery.    Examination-Activity Limitations Bed Mobility;Bend;Sit;Toileting;Stand;Stairs;Lift;Transfers;Squat;Locomotion Level;Carry;Dressing;Hygiene/Grooming;Continence    Examination-Participation Restrictions Yard Work;Interpersonal Relationship;Community Activity    Stability/Clinical Decision Making Evolving/Moderate complexity    Rehab Potential Fair    PT Frequency 3x / week    PT Duration 12 weeks    PT Treatment/Interventions ADLs/Self Care Home Management;Electrical Stimulation;Moist Heat;Cryotherapy;Gait training;Stair training;Functional mobility training;Therapeutic exercise;Balance training;Neuromuscular re-education;Cognitive remediation;Patient/family education;Orthotic Fit/Training;Wheelchair mobility training;Manual techniques;Passive range of motion;Energy conservation;Joint Manipulations;Canalith Repostioning;Therapeutic activities;Vestibular    PT Next Visit Plan Work on strength, sitting/standing balance, functional activities such as rolling, bed mobility, transfers; caregiver training    PT Home Exercise Plan no updates this session    Consulted and Agree with Plan of Care Patient;Family member/caregiver    Family Member Consulted DTR           Patient will benefit from skilled therapeutic intervention in order to improve the following deficits and impairments:  Abnormal gait, Decreased activity tolerance, Decreased cognition, Decreased endurance, Decreased knowledge of use of DME, Decreased range of motion, Decreased skin integrity,  Decreased strength, Impaired perceived functional ability, Impaired sensation, Impaired UE functional use, Improper body mechanics, Pain, Cardiopulmonary status limiting activity, Decreased balance, Decreased coordination, Decreased mobility, Difficulty walking, Impaired tone, Postural dysfunction, Dizziness  Visit  Diagnosis: Muscle weakness (generalized)  Other lack of coordination  Difficulty in walking, not elsewhere classified     Problem List Patient Active Problem List   Diagnosis Date Noted  . Sepsis ([place]) 10/18/2018    Shanece Cochrane PT, DPT 11/06/2019, 2:33 PM  [place] MAIN [facility] SERVICES [address] [place], [place], [address] Phone: [phone]   Fax:  [phone]  Name: Kenneth Summers MRN: [id number] Date of Birth: 1945-07-31

## 2019-11-06 NOTE — Therapy (Addendum)
[place] MAIN [facility] SERVICES [address] [place], [place], [address] Phone: [phone]   Fax:  [phone]  Occupational Therapy Treatment  Patient Details  Name: Kenneth Summers MRN: [id number] Date of Birth: October 25, 1945 No data recorded  Encounter Date: 11/06/2019   OT End of Session - 11/06/19 2228    Visit Number 143    Number of Visits 157    Date for OT Re-Evaluation 12/15/19    Authorization Type Progress report period starting 07/21/2019    OT Start Time 1300    OT Stop Time 1345    OT Time Calculation (min) 45 min    Activity Tolerance Patient limited by pain    Behavior During Therapy [facility] for tasks assessed/performed           Past Medical History:  Diagnosis Date  . Actinic keratosis   . Alcohol abuse   . APS (antiphospholipid syndrome) ([place])   . Basal cell carcinoma   . BPH (benign prostatic hyperplasia)   . CAD (coronary artery disease)   . Cellulitis   . Cervical spinal stenosis   . Chronic osteomyelitis ([place])   . CKD (chronic kidney disease)   . CKD (chronic kidney disease)   . Clostridium difficile diarrhea   . Depression   . Diplopia   . Fatigue   . GERD (gastroesophageal reflux disease)   . Hyperlipemia   . Hypertension   . Hypovitaminosis D   . IBS (irritable bowel syndrome)   . 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 Insomnia   . Left lumbosacral radiculopathy   . Moderate obstructive sleep apnea   . Osteomyelitis of foot ([place])   . PAD (peripheral artery disease) ([place])   . Pruritus   . RA (rheumatoid arthritis) ([place])   . Radiculopathy   . Restless legs syndrome   . RLS (restless legs syndrome)   . SLE (systemic lupus erythematosus related syndrome) ([place])   . 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 Stroke ([place])   . Toxic maculopathy of both eyes     Past Surgical History:  Procedure Laterality Date  . CARDIAC CATHETERIZATION    . CAROTID ENDARTERECTOMY    . CERVICAL  LAMINECTOMY    . CORONARY ANGIOPLASTY    . FRACTURE SURGERY    . fusion C5-6-7    . HERNIA REPAIR    . KNEE ARTHROSCOPY    . TONSILLECTOMY      There were no vitals filed for this visit.   Subjective Assessment - 11/06/19 1312    Subjective  Patient reports he loves action movies and is looking forward to finding a movie on netflix.    Pertinent History Pt. is a 74 y.o. 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 Pt. PMHx includes: Multiple Falls, Lupus, DJD, Renal Abscess, CVA. Pt. resides with his wife. Pt.'s wife and daughter assist with ADLs. Pt. has caregivers in  for 2 hours a day, 6 days a week. Pt. received Rehab services in acute care, at SNF for STR, and [place] services. Pt. is retired from [employer] for [employer] and [employer].    Patient Stated Goals To regain use of his left UE, and do more for himself.    Currently in Pain? No/denies    Pain Score 0-No pain           Pt seen this date with focus on fine  motor coordination and hand manipulation skills with left hand to turn and flip scrabble letters, work to formulate names and words associated with his favorite movie character, Theodis Shove.  Patient requires cues to scan to find all letters to turn and flip.  Patient required occasional min assist with formulating names and with spelling.  After task was complete, patient working to pick up tiles and place into container, one at a time and then with a hand sweeping motion with left hand while using right hand to hold bowl at the edge of the table.    Response to tx: Patient very focused on task this date, cues at times for turning and flipping of all tiles on the table, assist with spelling of names of characters from his favorite movie.  Did not drop any items on the floor this date.  Cues at times to use left hand for manipulation skills.  Continue OT to maximize safety and independence in necessary daily tasks.                             OT Long Term Goals - 10/23/19 1320      OT LONG TERM GOAL #1   Title Pt. will increase left shoulder flexion AROM by 10 degrees to access cabinet/shelf.    Baseline Pt. continues to progress with shoulder ROM. Marland KitchenShouler flexion 130, and continues to work on reaching with increasing emphasis on flexing trunk to improve functional reach.  07/21/19- shoulder flexion to 130 with effort    Time 12    Period Weeks    Status Partially Met    Target Date 12/15/19      OT LONG TERM GOAL #2   Title Pt. will donn a shirt with Supervision.    Baseline Pt. continues to require MinA donning a zip down jacket with consistent cues to initiate the correct direction of the jacket.  MinA for donning a T-shirt with assist to roll side to side to pull the shirt down over his back. 07/21/19 continues to require min assist at times for shirt/jacket    Time 12    Period Weeks    Status On-going    Target Date 12/15/19      OT LONG TERM GOAL #3   Title Pt. will require ModA to perform LE dressing    Baseline MaxA left, ModA with the right. Pt. has sores on his ankle, and feet. Pt.'s daughter is assisting.    Time 12    Period Weeks    Status Deferred    Target Date 12/15/19      OT LONG TERM GOAL #4   Title Pt. will improve left grip strength by 10# to be able to open a jar/container.    Baseline Pt. continues to present with limited left hand grip strength. Pt. continues to work towards opening containers, and bottles.    Time 12    Period Weeks    Status On-going    Target Date 02/14/19      OT LONG TERM GOAL #5   Title Pt. will improve left hand [facility] skills to be able to assist with buttoning/zipping.    Baseline Pt. is improving with manipulating the zipper on his jacket, however continues to require practice. Pt. continues to work on improving bilateral [facility] skills for buttoning/zipping.  07/21/19 improving with buttons and zippers.    Time 12    Period  Weeks  Status On-going    Target Date 12/15/19      OT LONG TERM GOAL #6   Title Pt. will demonstrate visual conmpensatory strategies for 100% of the time during ADLs    Baseline Pt.continues to require intermitten cues cues for left sides awareness.    Time 12    Period Weeks    Status Partially Met    Target Date 12/15/19      OT LONG TERM GOAL #7   Title Pt. will prepare a simple cold snack from w/c with supervision using cognitive compensatory strategies 100% of the time.    Baseline Pt. has difficulty with getting the w/c close enough to access the refrigerator    Time 12    Period Weeks    Status Deferred      OT LONG TERM GOAL #8   Title Pt. will accurately identify potential safety hazard using good safety awareness, and judgement 100% for ADLs, and IADLs.    Baseline Pt. requires cues    Time 12    Period Weeks    Status On-going    Target Date 12/15/19      OT LONG TERM GOAL  #9   TITLE Pt. will  navigate his w/c around obstacles with Supervision and 100% accuracy    Baseline Pt. continues to require cuing for obstacles on the left, however requires less cues overall. Pt. requires significant cues to engage his left UE during tasks. 07/21/2019 continues to improve with decreased assist at times    Time 12    Period Weeks    Status On-going    Target Date 12/15/19      OT LONG TERM GOAL  #10   TITLE Pt. will independently be able  to reach to place items into cabinetry, and closets.    Baseline Pt. BUE strength is improving. UE functional reaching conitnues to be limited.    Time 12    Period Weeks    Status On-going    Target Date 12/15/19      OT LONG TERM GOAL  #11   TITLE Pt. will increase BUE strength by 17mm grades in preparation for ADL transfers.    Baseline Pt. continues to work towards improving overall strength,    Time 12    Period Weeks    Status On-going    Target Date 12/15/19      OT LONG TERM GOAL  #12   TITLE Pt. will improve LUE  functional reaching with the left engaging the trunk 100% of the time during ADL tasks with minimal cues.    Baseline Pt. conitnues to improve with LUE functional reaching. Pt. conitnues to require cues to engage his trunk for extended reaching.    Time 12    Period Weeks    Status On-going    Target Date 12/15/19            Plan - 11/06/19           Clinical Impression Statement Patient very focused on task this date, cues at times for turning and flipping of all tiles on the table, assist with spelling of names of characters from his favorite movie.  Did not drop any items on the floor this date.  Cues at times to use left hand for manipulation skills.  Continue OT to maximize safety and independence in necessary daily tasks.       OT Occupational Profile and History Problem Focused Assessment - Including review of records relating  to presenting problem      Occupational performance deficits (Please refer to evaluation for details): ADL's      Body Structure / Function / Physical Skills UE functional use;Coordination;FMC;Dexterity;Strength;ROM      Cognitive Skills Attention;Memory;Emotional;Problem Solve;Safety Awareness      Rehab Potential Good      Clinical Decision Making Several treatment options, min-mod task modification necessary      Comorbidities Affecting Occupational Performance: Presence of comorbidities impacting occupational performance      Comorbidities impacting occupational performance description: Phyical, cognitive, visual,  medical comorbidities      Modification or Assistance to Complete Evaluation  No modification of tasks or assist necessary to complete eval      OT Frequency 2x / week      OT Duration 12 weeks      OT Treatment/Interventions Self-care/ADL training;DME and/or AE instruction;Therapeutic exercise;Therapeutic activities;Moist Heat;Cognitive remediation/compensation;Neuromuscular education;Visual/perceptual remediation/compensation;Coping strategies  training;Patient/family education;Passive range of motion;Psychosocial skills training;Energy conservation;Functional Mobility Training      Consulted and Agree with Plan of Care Patient;Family member/caregiver      Family Member Consulted daughter [name]                 Patient will benefit from skilled therapeutic intervention in order to improve the following deficits and impairments:     Body Structure / Function / Physical Skills: UE functional use, Coordination, FMC, Dexterity, Strength, ROM Cognitive Skills: Attention, Memory, Emotional, Problem Solve, Safety Awareness       Visit Diagnosis: Muscle weakness (generalized)  Other lack of coordination  Hemiplegia and hemiparesis following cerebral infarction affecting left non-dominant side [facility])    Problem List Patient Active Problem List   Diagnosis Date Noted  . Sepsis ([place]) 10/18/2018   Mehr Depaoli T Justeen Hehr, OTR/L, CLT  Courtney Fenlon 11/06/2019, 10:28 PM  [place] MAIN [facility] SERVICES [address] [place], [place], [address] Phone: [phone]   Fax:  [phone]  Name: Kenneth Summers MRN: [id number] Date of Birth: Apr 16, 1945

## 2019-11-10 ENCOUNTER — Other Ambulatory Visit: Payer: Self-pay

## 2019-11-10 ENCOUNTER — Encounter: Payer: Self-pay | Admitting: Occupational Therapy

## 2019-11-10 ENCOUNTER — Ambulatory Visit: Payer: Medicare Other | Admitting: Occupational Therapy

## 2019-11-10 ENCOUNTER — Encounter: Payer: Self-pay | Admitting: Physical Therapy

## 2019-11-10 ENCOUNTER — Ambulatory Visit: Payer: Medicare Other | Attending: Family Medicine | Admitting: Physical Therapy

## 2019-11-10 DIAGNOSIS — R262 Difficulty in walking, not elsewhere classified: Secondary | ICD-10-CM | POA: Diagnosis present

## 2019-11-10 DIAGNOSIS — R296 Repeated falls: Secondary | ICD-10-CM | POA: Diagnosis present

## 2019-11-10 DIAGNOSIS — R278 Other lack of coordination: Secondary | ICD-10-CM

## 2019-11-10 DIAGNOSIS — I69354 Hemiplegia and hemiparesis following cerebral infarction affecting left non-dominant side: Secondary | ICD-10-CM | POA: Diagnosis present

## 2019-11-10 DIAGNOSIS — M6281 Muscle weakness (generalized): Secondary | ICD-10-CM

## 2019-11-10 NOTE — Therapy (Signed)
[place] MAIN [facility] SERVICES [address] [place], [place], [address] Phone: [phone]   Fax:  [phone]  Occupational Therapy Treatment  Patient Details  Name: Kenneth Summers MRN: [id number] Date of Birth: Jun 12, 1945 No data recorded  Encounter Date: 11/10/2019   OT End of Session - 11/10/19 1403    Visit Number 144    Number of Visits 157    Date for OT Re-Evaluation 12/15/19    Authorization Type Progress report period starting 07/21/2019    OT Start Time 1347    OT Stop Time 1430    OT Time Calculation (min) 43 min    Activity Tolerance Patient limited by pain    Behavior During Therapy [facility] for tasks assessed/performed           Past Medical History:  Diagnosis Date  . Actinic keratosis   . Alcohol abuse   . APS (antiphospholipid syndrome) ([place])   . Basal cell carcinoma   . BPH (benign prostatic hyperplasia)   . CAD (coronary artery disease)   . Cellulitis   . Cervical spinal stenosis   . Chronic osteomyelitis ([place])   . CKD (chronic kidney disease)   . CKD (chronic kidney disease)   . Clostridium difficile diarrhea   . Depression   . Diplopia   . Fatigue   . GERD (gastroesophageal reflux disease)   . Hyperlipemia   . Hypertension   . Hypovitaminosis D   . IBS (irritable bowel syndrome)   . 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 Insomnia   . Left lumbosacral radiculopathy   . Moderate obstructive sleep apnea   . Osteomyelitis of foot ([place])   . PAD (peripheral artery disease) ([place])   . Pruritus   . RA (rheumatoid arthritis) ([place])   . Radiculopathy   . Restless legs syndrome   . RLS (restless legs syndrome)   . SLE (systemic lupus erythematosus related syndrome) ([place])   . 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 Stroke ([place])   . Toxic maculopathy of both eyes     Past Surgical History:  Procedure Laterality Date  . CARDIAC CATHETERIZATION    . CAROTID ENDARTERECTOMY    . CERVICAL  LAMINECTOMY    . CORONARY ANGIOPLASTY    . FRACTURE SURGERY    . fusion C5-6-7    . HERNIA REPAIR    . KNEE ARTHROSCOPY    . TONSILLECTOMY      There were no vitals filed for this visit.   Subjective Assessment - 11/10/19 1402    Subjective  Patient reports he loves action movies and is looking forward to finding a movie on netflix.    Pertinent History Pt. is a 74 y.o. 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 Pt. PMHx includes: Multiple Falls, Lupus, DJD, Renal Abscess, CVA. Pt. resides with his wife. Pt.'s wife and daughter assist with ADLs. Pt. has caregivers in  for 2 hours a day, 6 days a week. Pt. received Rehab services in acute care, at SNF for STR, and [place] services. Pt. is retired from [employer] for [employer] and [employer].    Pain Score 0-No pain          OT TREATMENT   Therapeutic Exercise:  Pt. performed BUE strengthening with the  BUE UBE for reciprocal motion, strength, and activity tolerance for 8 min.Pt. requires rest breaks and verbal cues for proper  technique. Pt. performed 2# dowel ex. for UE strengthening secondary to weakness. Bilateral shoulder flexion, chest press, circular patterns, and elbow flexion/extension were performed.  2# dumbbell weights for forearm supination/pronation, wrist flexion/extension, and radial deviation. Pt. requires rest breaks and verbal cues for proper technique.  Pt. presents with no reports of pain today. Pt. reports fatigue following PT today. No lateral trunk support needed during the exercises, or UBE.Pt. tolerated UE strengthening, and theUBE today.Pt.continues to present with weakness and impaired motor control, and motor planning.Pt. tolerated BUE exercises well, with no reportsofdiscomfort. Pt. required consistent verbal, visual, and tactile cues, however required less cuesto perform UE strengthening exercises, technique, and formas well as visual demonstration prior to the  exercises.Pt. continues to work on improving BUE strength, and [facility] skills in order to improve LUE functioning during ADLs, IADLs, and tranfers.                        OT Education - 11/10/19 1403    Education Details BUE Therapeutic ex    Person(s) Educated Patient    Methods Explanation;Demonstration    Comprehension Returned demonstration;Tactile cues required;Verbalized understanding;Need further instruction               OT Long Term Goals - 10/23/19 1320      OT LONG TERM GOAL #1   Title Pt. will increase left shoulder flexion AROM by 10 degrees to access cabinet/shelf.    Baseline Pt. continues to progress with shoulder ROM. Marland KitchenShouler flexion 130, and continues to work on reaching with increasing emphasis on flexing trunk to improve functional reach.  07/21/19- shoulder flexion to 130 with effort    Time 12    Period Weeks    Status Partially Met    Target Date 12/15/19      OT LONG TERM GOAL #2   Title Pt. will donn a shirt with Supervision.    Baseline Pt. continues to require MinA donning a zip down jacket with consistent cues to initiate the correct direction of the jacket.  MinA for donning a T-shirt with assist to roll side to side to pull the shirt down over his back. 07/21/19 continues to require min assist at times for shirt/jacket    Time 12    Period Weeks    Status On-going    Target Date 12/15/19      OT LONG TERM GOAL #3   Title Pt. will require ModA to perform LE dressing    Baseline MaxA left, ModA with the right. Pt. has sores on his ankle, and feet. Pt.'s daughter is assisting.    Time 12    Period Weeks    Status Deferred    Target Date 12/15/19      OT LONG TERM GOAL #4   Title Pt. will improve left grip strength by 10# to be able to open a jar/container.    Baseline Pt. continues to present with limited left hand grip strength. Pt. continues to work towards opening containers, and bottles.    Time 12    Period Weeks     Status On-going    Target Date 02/14/19      OT LONG TERM GOAL #5   Title Pt. will improve left hand [facility] skills to be able to assist with buttoning/zipping.    Baseline Pt. is improving with manipulating the zipper on his jacket, however continues to require practice. Pt. continues to work on improving bilateral [facility] skills for buttoning/zipping.  07/21/19 improving with buttons and zippers.    Time 12    Period Weeks    Status On-going    Target Date 12/15/19      OT LONG TERM GOAL #6   Title Pt. will demonstrate visual conmpensatory strategies for 100% of the time during ADLs    Baseline Pt.continues to require intermitten cues cues for left sides awareness.    Time 12    Period Weeks    Status Partially Met    Target Date 12/15/19      OT LONG TERM GOAL #7   Title Pt. will prepare a simple cold snack from w/c with supervision using cognitive compensatory strategies 100% of the time.    Baseline Pt. has difficulty with getting the w/c close enough to access the refrigerator    Time 12    Period Weeks    Status Deferred      OT LONG TERM GOAL #8   Title Pt. will accurately identify potential safety hazard using good safety awareness, and judgement 100% for ADLs, and IADLs.    Baseline Pt. requires cues    Time 12    Period Weeks    Status On-going    Target Date 12/15/19      OT LONG TERM GOAL  #9   TITLE Pt. will  navigate his w/c around obstacles with Supervision and 100% accuracy    Baseline Pt. continues to require cuing for obstacles on the left, however requires less cues overall. Pt. requires significant cues to engage his left UE during tasks. 07/21/2019 continues to improve with decreased assist at times    Time 12    Period Weeks    Status On-going    Target Date 12/15/19      OT LONG TERM GOAL  #10   TITLE Pt. will independently be able  to reach to place items into cabinetry, and closets.    Baseline Pt. BUE strength is improving. UE functional reaching conitnues  to be limited.    Time 12    Period Weeks    Status On-going    Target Date 12/15/19      OT LONG TERM GOAL  #11   TITLE Pt. will increase BUE strength by 61mm grades in preparation for ADL transfers.    Baseline Pt. continues to work towards improving overall strength,    Time 12    Period Weeks    Status On-going    Target Date 12/15/19      OT LONG TERM GOAL  #12   TITLE Pt. will improve LUE functional reaching with the left engaging the trunk 100% of the time during ADL tasks with minimal cues.    Baseline Pt. conitnues to improve with LUE functional reaching. Pt. conitnues to require cues to engage his trunk for extended reaching.    Time 12    Period Weeks    Status On-going    Target Date 12/15/19                 Plan - 11/10/19 1404    Clinical Impression Statement Pt. presents with no reports of pain today. Pt. reports fatigue following PT today. No lateral trunk support needed during the exercises, or UBE.Pt. tolerated UE strengthening, and theUBE today.Pt.continues to present with weakness and impaired motor control, and motor planning.Pt. tolerated BUE exercises well, with no reportsofdiscomfort. Pt. required consistent verbal, visual, and tactile cues, however required less cuesto perform UE strengthening exercises, technique, and formas  well as visual demonstration prior to the exercises.Pt. continues to work on improving BUE strength, and [facility] skills in order to improve LUE functioning during ADLs, IADLs, and tranfers.   OT Occupational Profile and History Problem Focused Assessment - Including review of records relating to presenting problem    Occupational performance deficits (Please refer to evaluation for details): ADL's    Body Structure / Function / Physical Skills UE functional use;Coordination;FMC;Dexterity;Strength;ROM    Cognitive Skills Attention;Memory;Emotional;Problem Solve;Safety Awareness    Rehab Potential Good    Clinical Decision Making  Several treatment options, min-mod task modification necessary    Comorbidities Affecting Occupational Performance: Presence of comorbidities impacting occupational performance    Comorbidities impacting occupational performance description: Phyical, cognitive, visual,  medical comorbidities    Modification or Assistance to Complete Evaluation  No modification of tasks or assist necessary to complete eval    OT Frequency 2x / week    OT Duration 12 weeks    OT Treatment/Interventions Self-care/ADL training;DME and/or AE instruction;Therapeutic exercise;Therapeutic activities;Moist Heat;Cognitive remediation/compensation;Neuromuscular education;Visual/perceptual remediation/compensation;Coping strategies training;Patient/family education;Passive range of motion;Psychosocial skills training;Energy conservation;Functional Mobility Training    Consulted and Agree with Plan of Care Patient;Family member/caregiver    Family Member Consulted daughter [name]           Patient will benefit from skilled therapeutic intervention in order to improve the following deficits and impairments:   Body Structure / Function / Physical Skills: UE functional use, Coordination, FMC, Dexterity, Strength, ROM Cognitive Skills: Attention, Memory, Emotional, Problem Solve, Safety Awareness     Visit Diagnosis: Muscle weakness (generalized)  Other lack of coordination    Problem List Patient Active Problem List   Diagnosis Date Noted  . Sepsis ([place]) 10/18/2018    Harrel Carina, MS, OTR/L 11/10/2019, 2:09 PM  [place] MAIN [facility] SERVICES [address] [place], [place], [address] Phone: [phone]   Fax:  [phone]  Name: Kenneth Summers MRN: [id number] Date of Birth: 1945/06/14

## 2019-11-10 NOTE — Therapy (Signed)
[place] [facility] [address] [place], [place], [address] Phone: [phone]   Fax:  [phone]  Physical Therapy Treatment Physical Therapy Progress Note   Dates of reporting period  10/02/19  to   11/10/19   Patient Details  Name: LEALAND ELTING MRN: [id number] Date of Birth: 17-Apr-1945 No data recorded  Encounter Date: 11/10/2019   PT End of Session - 11/10/19 1314    Visit Number 180    Number of Visits 252    Date for PT Re-Evaluation 02/02/20    PT Start Time 1302    PT Stop Time 1345    PT Time Calculation (min) 43 min    Equipment Utilized During Treatment Gait belt    Activity Tolerance Patient tolerated treatment well;No increased pain    Behavior During Therapy WFL for tasks assessed/performed           Past Medical History:  Diagnosis Date  . Actinic keratosis   . Alcohol abuse   . APS (antiphospholipid syndrome) ([employer])   . Basal cell carcinoma   . BPH (benign prostatic hyperplasia)   . CAD (coronary artery disease)   . Cellulitis   . Cervical spinal stenosis   . Chronic osteomyelitis ([place])   . CKD (chronic kidney disease)   . CKD (chronic kidney disease)   . Clostridium difficile diarrhea   . Depression   . Diplopia   . Fatigue   . GERD (gastroesophageal reflux disease)   . Hyperlipemia   . Hypertension   . Hypovitaminosis D   . IBS (irritable bowel syndrome)   . 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 Insomnia   . Left lumbosacral radiculopathy   . Moderate obstructive sleep apnea   . Osteomyelitis of foot ([place])   . PAD (peripheral artery disease) ([place])   . Pruritus   . RA (rheumatoid arthritis) ([place])   . Radiculopathy   . Restless legs syndrome   . RLS (restless legs syndrome)   . SLE (systemic lupus erythematosus related syndrome) ([place])   . 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 Stroke ([place])   . Toxic maculopathy of both eyes     Past Surgical History:   Procedure Laterality Date  . CARDIAC CATHETERIZATION    . CAROTID ENDARTERECTOMY    . CERVICAL LAMINECTOMY    . CORONARY ANGIOPLASTY    . FRACTURE SURGERY    . fusion C5-6-7    . HERNIA REPAIR    . KNEE ARTHROSCOPY    . TONSILLECTOMY      There were no vitals filed for this visit.   Subjective Assessment - 11/10/19 1313    Subjective Patient reports doing well. Denies any pain. Reports standing over the weekend;    Pertinent History Patient is a 74 y.o. male who presents to outpatient physical therapy with a referral for medical diagnosis of CVA. This patient's chief complaints consist of left hemiplegia and overall deconditioning leading to the following functional deficits: dependent for ADLs, IADLs, unable to transfer to car, dependent transfers at home with hoyer lift, unable to walk or stand without significant assistance, difficulty with W/C navigation..    Diagnostic tests Brain MRI 11/01/2017: IMPRESSION: 1. Acute right ACA territory infarct as demonstrated on prior CT imaging. No intracranial hemorrhage or significant mass effect. 2. 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    Patient Stated Goals wants to be able to  walk again    Currently in Pain? No/denies             TREATMENT:  Transferred stand pivot wheelchair to [employer], with RW Min A +2  Nustep: BUE/BLE level 3 x5 min with cues for steps per minute >60 (averaged 50) with total distance , 0.15 miles Additional 5 min level 1 with BLE only with cues to increase steps per minute, able to maintain around 35-45; total distance on Nustep 0.24 miles   Transferred squat pivot nustep to wheelchair max A +3 with cues for hand placement  Ptrequires verbal and visual cues to improve ROM;He was able to exhibit improved LLE AROM with cues; Patient also requires cues for erect posture for better trunk control;  Following exercise: Attempted sit<>Stand transfers in parallel bars,  Patient chair adjusted to 27 inch,  he was able to transfer sit<>Stand from elevated chair with min A +1 x2 reps; Patient required min A for neutral weight shift with mod VCs and tactile cues for hip extension and erect standing posture. He was only able to stand for approximately 10 sec each stand due to fatigue;    Patient requiredmodA to scoot back in chair at end of session due to increased fatigue    Response to treatment:Patient tolerated session well.He does fatigue quickly. He was really tired after nustep but was able to exhibit better UE/LE movement. Patient did have increased difficulty with sit<>stand transfers following nustep due to fatigue   [name] condition has the potential to improve in response to therapy. Maximum improvement is yet to be obtained. The anticipated improvement is attainable and reasonable in a generally predictable time.  Patient reports adherence with HEP. He has also been standing some at home with caregiver assistance to improve cardiovascular endurance and increase LE strengthening;                          PT Education - 11/10/19 1314    Education Details transfers/positioning. LE strengthening, HEP    Person(s) Educated Patient;Child(ren)    Methods Explanation;Verbal cues    Comprehension Verbalized understanding;Returned demonstration;Verbal cues required;Need further instruction            PT Short Term Goals - 08/18/19 1305      PT SHORT TERM GOAL #1   Title Be independent with initial home exercise program for self-management of symptoms.    Baseline HEP to be given at second session; advice to practice unsupported sitting at home (06/19/2018); 07/08/18: Performing at home, 6/29: needs assistance but is doing exercise program regularly; 08/22/18: adherent;    Time 2    Period Weeks    Status Achieved    Target Date 06/26/18             PT Long Term Goals - 11/10/19 1323      PT LONG TERM GOAL #1    Title Patient will complete all bed mobility with min A to improve functional independence for getting in and out of bed and adjusting in bed.     Baseline max A - total A reported by family (06/19/2018); 07/08/18: still requires maxA, dtr reports improvement in rolling since starting therapy; 6/23: min A to supervision in clinic; not doing at home right; 8/5: Pt's daughter states that his rolling has improved, but still has difficulty with all other bed mobility; 10/23/18: pts daughter states that she is doing about 35% of the work if he is in proper position,  he does uses the bed rails to help roll;  using Michiel Sites for coming to sit EOB; 10/12: pts daughter states that she is doing about 30% of the work if he is in proper position, he does uses the bed rails to help roll, but it is improving;  using Hoyer for coming to sit EOB, 10/29: min A for sit to sidelying, rolling supervision, able to transition sidelying to sit with HHA;    Time 12    Period Weeks    Status Achieved    Target Date 02/02/20      PT LONG TERM GOAL #2   Title Patient will complete sit <> stand transfer chair to chair with Min A and LRAD to improve functional independence for household and community mobility.     Baseline supervision for STS, still requires elevated surface height to 27"    Time 12    Period Weeks    Status Achieved    Target Date 02/02/20      PT LONG TERM GOAL #3   Title Patient will navigate power w/c with min A x 100 feet to improve mobility for household and short community distances.    Baseline required total A (06/12/2018); able to roll 20 feet with min A (06/20/2018); 07/08/18: Pt can go approximately 10' without assist per family;  10/23/2018: pt's daughter states that his power WC mobility has become 100% better with the new hand control, slowed speed, and with practice; 10/12: pt's daughter reports that he can perform household WC mobility and limited community ambulation with minA    Time 12    Period Weeks     Status Achieved    Target Date 02/02/20      PT LONG TERM GOAL #4   Title Patient will complete car transfers W/C to family SUV with min A using LRAD to improve his abilty to participate in community activities.     Baseline unable to complete car transfers at all (06/12/2018); unable to complete car transfers at all (06/19/2018).; 07/08/18: Unable to perform at this time; 09/11/18: unable to perform at this time; 10/23/2018: unable to complete car transfers at all; 10/12:  unable to complete car transfers at all, 10/29: unable; 01/13/19: unable; 2/11: min-mod A +2, 3/10: min A+1    Time 12    Period Weeks    Status Achieved    Target Date 02/02/20      PT LONG TERM GOAL #5   Title Patient will ambulate at least 10 feet with min A using LRAD to improve mobility for household and community distances.    Baseline unable to take steps (06/12/2018); able to shift R leg forward and back with max A and RW at edge of plinth (06/19/2018); 07/08/18: unable to ambulate at this time. 09/11/18: unable to perform at this time; 10/23/2018: unable to perform at this time; 10/12: unable to perform at this time, 10/29: unable at this time; 01/13/19: unable at this time, 2/11: min A to step with RLE, unable to step with LLE, 3/10: able to take 2 steps in parallel bars with min A for safety; 4/1: min A for walking 10 feet in parallel bars, 5/24: min A walking 10 feet in parallel bars; 6/21: no change 7/14: no change    Time 12    Period Weeks    Status Partially Met    Target Date 02/02/20      PT LONG TERM GOAL #6   Title Patient will be able to  safety navigate up/down ramp with power chair, exhibiting good safety awareness, mod I to safely enter/exit his home.    Baseline 2/11: supervision with intermittent min A;4/1: supervision; 5/24: supervision    Time 12    Period Weeks    Status Partially Met    Target Date 02/02/20      PT LONG TERM GOAL #7   Title Patient will increase functional reach test to >15 inches in  sitting to exhibit improved sitting balance and positioning and reduce fall risk;    Baseline 07/30/18: 12 inches; 09/11/18: 10-12 inches; 10/23/18: 12-13 inch; 10/12: 14.5", 10/29: 15.5 inch    Time 4    Period Weeks    Status Achieved    Target Date 02/02/20      PT LONG TERM GOAL #8   Title Patient will increase LLE quad strength to 3+/5 to improve functional strength for standing and mobility;    Baseline 10/28: 2+/5; 01/13/19: 2+/5, 2/11: 3-/5, 04/16/19: 3-/5, 05/08/19: 3-/5, 5/24: 3-/5, 6/21: 3-/5, 7/14: 3-/5    Time 12    Period Weeks    Status Partially Met    Target Date 02/02/20      PT LONG TERM GOAL  #9   TITLE Patient will report no vertigo with provoking motions or positions in order to be able to perform ADLs and mobility tasks without symptoms.    Baseline 06/30/19: reports no dizziness in last week; 6/21: one episode this weekend; 7/14: experiencing one a week;    Time 12    Period Weeks    Status Partially Met    Target Date 02/02/20                 Plan - 11/10/19 1345    Clinical Impression Statement Patient motivated and participated well within session. He was able to exhibit better UE/LE movement on nustep with better trunk control being able to exercise for longer time and longer distance. He does require mod VCs for forward weight shift with transfers for better sit<>Stand ability. He did require increased assistance with transfer from nustep to wheelchair likely due to lower seat height and LE fatigue. Patient was able to exhibit better trunk control and neutral weight shift in standing this session despite increased fatigue. He would benefit from additional skilled PT intervention to improve strength, balance and mobility.    Personal Factors and Comorbidities Age;Comorbidity 3+    Comorbidities Relevant past medical history and comorbidities include long term steroid use for lupus, CKD, chronic osteomyelitis, cervical spine stenosis, BPH, APS, alcohol abuse,  peripheral artery disease, depression, GERD, obstructive sleep apnea, HTN, IBS, IgA deficiency, lumbar radiculopathy, RA, Stroke, toxic maculopathy in both eyes, systemic lupus erythematosus related syndrome, cardiac catheterization, carotid endarterectomy, cervical laminectomy, coronary angioplasty, Fusion C5-C7, knee arthroplasty, lumbar surgery.    Examination-Activity Limitations Bed Mobility;Bend;Sit;Toileting;Stand;Stairs;Lift;Transfers;Squat;Locomotion Level;Carry;Dressing;Hygiene/Grooming;Continence    Examination-Participation Restrictions Yard Work;Interpersonal Relationship;Community Activity    Stability/Clinical Decision Making Evolving/Moderate complexity    Rehab Potential Fair    PT Frequency 3x / week    PT Duration 12 weeks    PT Treatment/Interventions ADLs/Self Care Home Management;Electrical Stimulation;Moist Heat;Cryotherapy;Gait training;Stair training;Functional mobility training;Therapeutic exercise;Balance training;Neuromuscular re-education;Cognitive remediation;Patient/family education;Orthotic Fit/Training;Wheelchair mobility training;Manual techniques;Passive range of motion;Energy conservation;Joint Manipulations;Canalith Repostioning;Therapeutic activities;Vestibular    PT Next Visit Plan Work on strength, sitting/standing balance, functional activities such as rolling, bed mobility, transfers; caregiver training    PT Home Exercise Plan no updates this session    Consulted and Agree with Plan of  Care Patient;Family member/caregiver    Family Member Consulted DTR           Patient will benefit from skilled therapeutic intervention in order to improve the following deficits and impairments:  Abnormal gait, Decreased activity tolerance, Decreased cognition, Decreased endurance, Decreased knowledge of use of DME, Decreased range of motion, Decreased skin integrity, Decreased strength, Impaired perceived functional ability, Impaired sensation, Impaired UE functional use,  Improper body mechanics, Pain, Cardiopulmonary status limiting activity, Decreased balance, Decreased coordination, Decreased mobility, Difficulty walking, Impaired tone, Postural dysfunction, Dizziness  Visit Diagnosis: Muscle weakness (generalized)  Other lack of coordination  Difficulty in walking, not elsewhere classified  Hemiplegia and hemiparesis following cerebral infarction affecting left non-dominant side (HCC)  Repeated falls     Problem List Patient Active Problem List   Diagnosis Date Noted  . Sepsis ([place]) 10/18/2018    Herberth Deharo PT, DPT 11/10/2019, 1:47 PM  [place] [facility] [address] [place], [place], [address] Phone: [phone]   Fax:  [phone]  Name: COTY LARSH MRN: [id number] Date of Birth: [birth year]

## 2019-11-10 NOTE — Addendum Note (Signed)
Addended by: Blanche East E on: 11/10/2019 02:14 PM   Modules accepted: Orders

## 2019-11-12 ENCOUNTER — Ambulatory Visit: Payer: Medicare Other | Admitting: Physical Therapy

## 2019-11-12 ENCOUNTER — Ambulatory Visit: Payer: Medicare Other | Admitting: Occupational Therapy

## 2019-11-13 ENCOUNTER — Ambulatory Visit: Payer: Medicare Other | Admitting: Physical Therapy

## 2019-11-13 ENCOUNTER — Encounter: Payer: Self-pay | Admitting: Physical Therapy

## 2019-11-13 ENCOUNTER — Other Ambulatory Visit: Payer: Self-pay

## 2019-11-13 ENCOUNTER — Ambulatory Visit: Payer: Medicare Other | Admitting: Occupational Therapy

## 2019-11-13 ENCOUNTER — Encounter: Payer: Self-pay | Admitting: Occupational Therapy

## 2019-11-13 DIAGNOSIS — R262 Difficulty in walking, not elsewhere classified: Secondary | ICD-10-CM

## 2019-11-13 DIAGNOSIS — M6281 Muscle weakness (generalized): Secondary | ICD-10-CM

## 2019-11-13 DIAGNOSIS — R278 Other lack of coordination: Secondary | ICD-10-CM

## 2019-11-13 DIAGNOSIS — R296 Repeated falls: Secondary | ICD-10-CM

## 2019-11-13 NOTE — Therapy (Signed)
[place] MAIN [facility] SERVICES [address] [place], [place], [address] Phone: [phone]   Fax:  [phone]  Occupational Therapy Treatment  Patient Details  Name: Kenneth Summers MRN: [id number] Date of Birth: 05/09/1945 No data recorded  Encounter Date: 11/13/2019   OT End of Session - 11/13/19 1317    Visit Number 145    Number of Visits 157    Date for OT Re-Evaluation 12/15/19    Authorization Type Progress report period starting 07/21/2019    OT Start Time 1300    OT Stop Time 1345    OT Time Calculation (min) 45 min    Activity Tolerance Patient limited by pain    Behavior During Therapy [facility] for tasks assessed/performed           Past Medical History:  Diagnosis Date  . Actinic keratosis   . Alcohol abuse   . APS (antiphospholipid syndrome) ([place])   . Basal cell carcinoma   . BPH (benign prostatic hyperplasia)   . CAD (coronary artery disease)   . Cellulitis   . Cervical spinal stenosis   . Chronic osteomyelitis ([place])   . CKD (chronic kidney disease)   . CKD (chronic kidney disease)   . Clostridium difficile diarrhea   . Depression   . Diplopia   . Fatigue   . GERD (gastroesophageal reflux disease)   . Hyperlipemia   . Hypertension   . Hypovitaminosis D   . IBS (irritable bowel syndrome)   . 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 Insomnia   . Left lumbosacral radiculopathy   . Moderate obstructive sleep apnea   . Osteomyelitis of foot ([place])   . PAD (peripheral artery disease) ([place])   . Pruritus   . RA (rheumatoid arthritis) ([place])   . Radiculopathy   . Restless legs syndrome   . RLS (restless legs syndrome)   . SLE (systemic lupus erythematosus related syndrome) ([place])   . 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 Stroke ([place])   . Toxic maculopathy of both eyes     Past Surgical History:  Procedure Laterality Date  . CARDIAC CATHETERIZATION    . CAROTID ENDARTERECTOMY    . CERVICAL  LAMINECTOMY    . CORONARY ANGIOPLASTY    . FRACTURE SURGERY    . fusion C5-6-7    . HERNIA REPAIR    . KNEE ARTHROSCOPY    . TONSILLECTOMY      There were no vitals filed for this visit.   Subjective Assessment - 11/13/19 1315    Subjective  Patient reports that his hairdresser came to his house to cut his hair last night.    Patient is accompanied by: Family member    Pertinent History Pt. is a 74 y.o. 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 Pt. PMHx includes: Multiple Falls, Lupus, DJD, Renal Abscess, CVA. Pt. resides with his wife. Pt.'s wife and daughter assist with ADLs. Pt. has caregivers in  for 2 hours a day, 6 days a week. Pt. received Rehab services in acute care, at SNF for STR, and [place] services. Pt. is retired from [employer] for [employer] and [employer].    Currently in Pain? No/denies           OT TREATMENT   Therapeutic Exercise:  Pt. performed BUE strengthening with the  BUE UBE for reciprocal motion, strength, and activity tolerancefor 8 min.Pt.  requires rest breaks and verbal cues for proper technique.Pt. performed 2# dowel ex. for UE strengthening secondary to weakness. Bilateral shoulder flexion, chest press, and circular patterns were performed.  2 sets 20 reps each. 3# dumbbell weights for elbow flexion, extension  3# for the right, and 2# for the left forearm supination/pronation, wrist flexion/extension, and radial deviation. Pt. requires rest breaks and verbal cues for proper technique.  Pt. was able to tolerate increased hand weight today. No reports of pain, or discomfort.  Pt. required lateral trunk support at his left side during the exercises, and UBE.Pt. tolerated UE strengthening, and theUBE today.Pt.continues to present with weakness and impaired motor control, and motor planning.Pt. tolerated BUE exercises well, with no reportsofdiscomfort. Pt. required consistent verbal, visual, and tactile cues,  however required less cuesto perform UE strengthening exercises, technique, and formas well as visual demonstration prior to the exercises.Pt. continues to work on improving BUE strength, and [facility] skills in order to improve LUE functioning during ADLs, IADLs, and tranfers.                         OT Education - 11/13/19 1317    Education Details BUE Therapeutic ex    Person(s) Educated Patient    Methods Explanation;Demonstration    Comprehension Returned demonstration;Tactile cues required;Verbalized understanding;Need further instruction               OT Long Term Goals - 10/23/19 1320      OT LONG TERM GOAL #1   Title Pt. will increase left shoulder flexion AROM by 10 degrees to access cabinet/shelf.    Baseline Pt. continues to progress with shoulder ROM. Marland KitchenShouler flexion 130, and continues to work on reaching with increasing emphasis on flexing trunk to improve functional reach.  07/21/19- shoulder flexion to 130 with effort    Time 12    Period Weeks    Status Partially Met    Target Date 12/15/19      OT LONG TERM GOAL #2   Title Pt. will donn a shirt with Supervision.    Baseline Pt. continues to require MinA donning a zip down jacket with consistent cues to initiate the correct direction of the jacket.  MinA for donning a T-shirt with assist to roll side to side to pull the shirt down over his back. 07/21/19 continues to require min assist at times for shirt/jacket    Time 12    Period Weeks    Status On-going    Target Date 12/15/19      OT LONG TERM GOAL #3   Title Pt. will require ModA to perform LE dressing    Baseline MaxA left, ModA with the right. Pt. has sores on his ankle, and feet. Pt.'s daughter is assisting.    Time 12    Period Weeks    Status Deferred    Target Date 12/15/19      OT LONG TERM GOAL #4   Title Pt. will improve left grip strength by 10# to be able to open a jar/container.    Baseline Pt. continues to present with  limited left hand grip strength. Pt. continues to work towards opening containers, and bottles.    Time 12    Period Weeks    Status On-going    Target Date 02/14/19      OT LONG TERM GOAL #5   Title Pt. will improve left hand [facility] skills to be able to assist with buttoning/zipping.  Baseline Pt. is improving with manipulating the zipper on his jacket, however continues to require practice. Pt. continues to work on improving bilateral [facility] skills for buttoning/zipping.  07/21/19 improving with buttons and zippers.    Time 12    Period Weeks    Status On-going    Target Date 12/15/19      OT LONG TERM GOAL #6   Title Pt. will demonstrate visual conmpensatory strategies for 100% of the time during ADLs    Baseline Pt.continues to require intermitten cues cues for left sides awareness.    Time 12    Period Weeks    Status Partially Met    Target Date 12/15/19      OT LONG TERM GOAL #7   Title Pt. will prepare a simple cold snack from w/c with supervision using cognitive compensatory strategies 100% of the time.    Baseline Pt. has difficulty with getting the w/c close enough to access the refrigerator    Time 12    Period Weeks    Status Deferred      OT LONG TERM GOAL #8   Title Pt. will accurately identify potential safety hazard using good safety awareness, and judgement 100% for ADLs, and IADLs.    Baseline Pt. requires cues    Time 12    Period Weeks    Status On-going    Target Date 12/15/19      OT LONG TERM GOAL  #9   TITLE Pt. will  navigate his w/c around obstacles with Supervision and 100% accuracy    Baseline Pt. continues to require cuing for obstacles on the left, however requires less cues overall. Pt. requires significant cues to engage his left UE during tasks. 07/21/2019 continues to improve with decreased assist at times    Time 12    Period Weeks    Status On-going    Target Date 12/15/19      OT LONG TERM GOAL  #10   TITLE Pt. will independently be able   to reach to place items into cabinetry, and closets.    Baseline Pt. BUE strength is improving. UE functional reaching conitnues to be limited.    Time 12    Period Weeks    Status On-going    Target Date 12/15/19      OT LONG TERM GOAL  #11   TITLE Pt. will increase BUE strength by 45mm grades in preparation for ADL transfers.    Baseline Pt. continues to work towards improving overall strength,    Time 12    Period Weeks    Status On-going    Target Date 12/15/19      OT LONG TERM GOAL  #12   TITLE Pt. will improve LUE functional reaching with the left engaging the trunk 100% of the time during ADL tasks with minimal cues.    Baseline Pt. conitnues to improve with LUE functional reaching. Pt. conitnues to require cues to engage his trunk for extended reaching.    Time 12    Period Weeks    Status On-going    Target Date 12/15/19                 Plan - 11/13/19 1318    Clinical Impression Statement Pt. was able to tolerate increased hand weight today. No reports of pain, or discomfort.  Pt. required lateral trunk support at his left side during the exercises, and UBE.Pt. tolerated UE strengthening, and theUBE today.Pt.continues to present with  weakness and impaired motor control, and motor planning.Pt. tolerated BUE exercises well, with no reportsofdiscomfort. Pt. required consistent verbal, visual, and tactile cues, however required less cuesto perform UE strengthening exercises, technique, and formas well as visual demonstration prior to the exercises.Pt. continues to work on improving BUE strength, and [facility] skills in order to improve LUE functioning during ADLs, IADLs, and tranfers.   Occupational performance deficits (Please refer to evaluation for details): ADL's    Body Structure / Function / Physical Skills UE functional use;Coordination;FMC;Dexterity;Strength;ROM    Cognitive Skills Attention;Memory;Emotional;Problem Solve;Safety Awareness    Rehab Potential Good     Clinical Decision Making Several treatment options, min-mod task modification necessary    Comorbidities Affecting Occupational Performance: Presence of comorbidities impacting occupational performance    Comorbidities impacting occupational performance description: Phyical, cognitive, visual,  medical comorbidities    Modification or Assistance to Complete Evaluation  No modification of tasks or assist necessary to complete eval    OT Frequency 2x / week    OT Duration 12 weeks    OT Treatment/Interventions Self-care/ADL training;DME and/or AE instruction;Therapeutic exercise;Therapeutic activities;Moist Heat;Cognitive remediation/compensation;Neuromuscular education;Visual/perceptual remediation/compensation;Coping strategies training;Patient/family education;Passive range of motion;Psychosocial skills training;Energy conservation;Functional Mobility Training    Consulted and Agree with Plan of Care Patient;Family member/caregiver           Patient will benefit from skilled therapeutic intervention in order to improve the following deficits and impairments:   Body Structure / Function / Physical Skills: UE functional use, Coordination, FMC, Dexterity, Strength, ROM Cognitive Skills: Attention, Memory, Emotional, Problem Solve, Safety Awareness     Visit Diagnosis: Muscle weakness (generalized)  Other lack of coordination    Problem List Patient Active Problem List   Diagnosis Date Noted  . Sepsis ([place]) 10/18/2018    Harrel Carina, MS, OTR/L 11/13/2019, 1:23 PM  [place] MAIN [facility] SERVICES [address] [place], [place], [address] Phone: [phone]   Fax:  [phone]  Name: EMIDIO WARRELL MRN: [id number] Date of Birth: [birth year]

## 2019-11-13 NOTE — Therapy (Signed)
[place] [facility] SERVICES [address] [place], [place], [address] Phone: [phone]   Fax:  [phone]  Physical Therapy Treatment  Patient Details  Name: Kenneth Summers MRN: [id number] Date of Birth: April 22, 1945 No data recorded  Encounter Date: 11/13/2019   PT End of Session - 11/13/19 1359    Visit Number [id number]    Number of Visits 252    Date for PT Re-Evaluation 02/02/20    PT Start Time 1346    PT Stop Time 1430    PT Time Calculation (min) 44 min    Equipment Utilized During Treatment Gait belt    Activity Tolerance Patient tolerated treatment well;No increased pain    Behavior During Therapy WFL for tasks assessed/performed           Past Medical History:  Diagnosis Date  . Actinic keratosis   . Alcohol abuse   . APS (antiphospholipid syndrome) ([place])   . Basal cell carcinoma   . BPH (benign prostatic hyperplasia)   . CAD (coronary artery disease)   . Cellulitis   . Cervical spinal stenosis   . Chronic osteomyelitis ([place])   . CKD (chronic kidney disease)   . CKD (chronic kidney disease)   . Clostridium difficile diarrhea   . Depression   . Diplopia   . Fatigue   . GERD (gastroesophageal reflux disease)   . Hyperlipemia   . Hypertension   . Hypovitaminosis D   . IBS (irritable bowel syndrome)   . 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 Insomnia   . Left lumbosacral radiculopathy   . Moderate obstructive sleep apnea   . Osteomyelitis of foot ([place])   . PAD (peripheral artery disease) ([place])   . Pruritus   . RA (rheumatoid arthritis) ([place])   . Radiculopathy   . Restless legs syndrome   . RLS (restless legs syndrome)   . SLE (systemic lupus erythematosus related syndrome) ([place])   . 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 Stroke ([place])   . Toxic maculopathy of both eyes     Past Surgical History:  Procedure Laterality Date  . CARDIAC CATHETERIZATION    . CAROTID ENDARTERECTOMY    .  CERVICAL LAMINECTOMY    . CORONARY ANGIOPLASTY    . FRACTURE SURGERY    . fusion C5-6-7    . HERNIA REPAIR    . KNEE ARTHROSCOPY    . TONSILLECTOMY      There were no vitals filed for this visit.   Subjective Assessment - 11/13/19 1346    Subjective Patient reports doing well; denies any pain; Reports working on LE exercise;    Pertinent History Patient is a 74 y.o. male who presents to outpatient physical therapy with a referral for medical diagnosis of CVA. This patient's chief complaints consist of left hemiplegia and overall deconditioning leading to the following functional deficits: dependent for ADLs, IADLs, unable to transfer to car, dependent transfers at home with hoyer lift, unable to walk or stand without significant assistance, difficulty with W/C navigation..    Diagnostic tests Brain MRI 11/01/2017: IMPRESSION: 1. Acute right ACA territory infarct as demonstrated on prior CT imaging. No intracranial hemorrhage or significant mass effect. 2. 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    Patient Stated Goals wants to be able to walk again    Currently in Pain? No/denies  TREATMENT: Prior to transfersand in between transfers: Patient sittingin wheelchair: -hip flexion march x10 reps bilaterally;  -LLE only LAQx10with visual/tactile cues for increased AROM -arms in lap, resting UE, erect posture,deep breaths x2 min with verbal and tactile cues for neutral weight shift; Required min VCs and visual cues for better AROM to increase strengthening;  Ptrequires verbal and visual cues to improve ROM;He was able to exhibit improved LLE AROM with cues; Patient also requires cues for erect posture for better trunk control;Patient exhibits improved neutral position this session with less lateral trunk lean;  Following exercise: Transferred sit<>Stand in parallel bars (seat adjusted to27inch from floor  height) X9repswith min A- CGA;herequiredmodVCs for forward weight shift for better trunk control and transfer ability; Patient able to exhibit better transfer ability with increased forward weight shiftwith less posterior loss of balance.   Patient required min Ato CGA for standing posture. He exhibits increased left sided lean this session and increased posterior lean; Patient appears more fatigued today;   While standing: -mini squatx15reps with min A for safety; -RLE hip flexion marchx15reps, with min A and therapist blocking LLE knee; patient does exhibit heavy posterior lean with increased repetition;  Attempted LLE hip flexion march but patient exhibits heavy posterior lean being unable to lift LLE;   Patient requiredmodA to scoot back in chair at end of session due to increased fatigue                        PT Education - 11/13/19 1359    Education Details transfers/positioning, LE strengthening, HEP    Person(s) Educated Patient;Child(ren)    Methods Explanation;Demonstration;Verbal cues    Comprehension Verbalized understanding;Returned demonstration;Verbal cues required;Need further instruction            PT Short Term Goals - 08/18/19 1305      PT SHORT TERM GOAL #1   Title Be independent with initial home exercise program for self-management of symptoms.    Baseline HEP to be given at second session; advice to practice unsupported sitting at home (06/19/2018); 07/08/18: Performing at home, 6/29: needs assistance but is doing exercise program regularly; 08/22/18: adherent;    Time 2    Period Weeks    Status Achieved    Target Date 06/26/18             PT Long Term Goals - 11/10/19 1323      PT LONG TERM GOAL #1   Title Patient will complete all bed mobility with min A to improve functional independence for getting in and out of bed and adjusting in bed.     Baseline max A - total A reported by family (06/19/2018); 07/08/18:  still requires maxA, dtr reports improvement in rolling since starting therapy; 6/23: min A to supervision in clinic; not doing at home right; 8/5: Pt's daughter states that his rolling has improved, but still has difficulty with all other bed mobility; 10/23/18: pts daughter states that she is doing about 35% of the work if he is in proper position, he does uses the bed rails to help roll;  using Hoyer for coming to sit EOB; 10/12: pts daughter states that she is doing about 30% of the work if he is in proper position, he does uses the bed rails to help roll, but it is improving;  using Hoyer for coming to sit EOB, 10/29: min A for sit to sidelying, rolling supervision, able to transition sidelying to sit with HHA;  Time 12    Period Weeks    Status Achieved    Target Date 02/02/20      PT LONG TERM GOAL #2   Title Patient will complete sit <> stand transfer chair to chair with Min A and LRAD to improve functional independence for household and community mobility.     Baseline supervision for STS, still requires elevated surface height to 27"    Time 12    Period Weeks    Status Achieved    Target Date 02/02/20      PT LONG TERM GOAL #3   Title Patient will navigate power w/c with min A x 100 feet to improve mobility for household and short community distances.    Baseline required total A (06/12/2018); able to roll 20 feet with min A (06/20/2018); 07/08/18: Pt can go approximately 10' without assist per family;  10/23/2018: pt's daughter states that his power WC mobility has become 100% better with the new hand control, slowed speed, and with practice; 10/12: pt's daughter reports that he can perform household WC mobility and limited community ambulation with minA    Time 12    Period Weeks    Status Achieved    Target Date 02/02/20      PT LONG TERM GOAL #4   Title Patient will complete car transfers W/C to family SUV with min A using LRAD to improve his abilty to participate in community  activities.     Baseline unable to complete car transfers at all (06/12/2018); unable to complete car transfers at all (06/19/2018).; 07/08/18: Unable to perform at this time; 09/11/18: unable to perform at this time; 10/23/2018: unable to complete car transfers at all; 10/12:  unable to complete car transfers at all, 10/29: unable; 01/13/19: unable; 2/11: min-mod A +2, 3/10: min A+1    Time 12    Period Weeks    Status Achieved    Target Date 02/02/20      PT LONG TERM GOAL #5   Title Patient will ambulate at least 10 feet with min A using LRAD to improve mobility for household and community distances.    Baseline unable to take steps (06/12/2018); able to shift R leg forward and back with max A and RW at edge of plinth (06/19/2018); 07/08/18: unable to ambulate at this time. 09/11/18: unable to perform at this time; 10/23/2018: unable to perform at this time; 10/12: unable to perform at this time, 10/29: unable at this time; 01/13/19: unable at this time, 2/11: min A to step with RLE, unable to step with LLE, 3/10: able to take 2 steps in parallel bars with min A for safety; 4/1: min A for walking 10 feet in parallel bars, 5/24: min A walking 10 feet in parallel bars; 6/21: no change 7/14: no change    Time 12    Period Weeks    Status Partially Met    Target Date 02/02/20      PT LONG TERM GOAL #6   Title Patient will be able to safety navigate up/down ramp with power chair, exhibiting good safety awareness, mod I to safely enter/exit his home.    Baseline 2/11: supervision with intermittent min A;4/1: supervision; 5/24: supervision    Time 12    Period Weeks    Status Partially Met    Target Date 02/02/20      PT LONG TERM GOAL #7   Title Patient will increase functional reach test to >15 inches in sitting  to exhibit improved sitting balance and positioning and reduce fall risk;    Baseline 07/30/18: 12 inches; 09/11/18: 10-12 inches; 10/23/18: 12-13 inch; 10/12: 14.5", 10/29: 15.5 inch    Time 4    Period  Weeks    Status Achieved    Target Date 02/02/20      PT LONG TERM GOAL #8   Title Patient will increase LLE quad strength to 3+/5 to improve functional strength for standing and mobility;    Baseline 10/28: 2+/5; 01/13/19: 2+/5, 2/11: 3-/5, 04/16/19: 3-/5, 05/08/19: 3-/5, 5/24: 3-/5, 6/21: 3-/5, 7/14: 3-/5    Time 12    Period Weeks    Status Partially Met    Target Date 02/02/20      PT LONG TERM GOAL  #9   TITLE Patient will report no vertigo with provoking motions or positions in order to be able to perform ADLs and mobility tasks without symptoms.    Baseline 06/30/19: reports no dizziness in last week; 6/21: one episode this weekend; 7/14: experiencing one a week;    Time 12    Period Weeks    Status Partially Met    Target Date 02/02/20                 Plan - 11/13/19 1438    Clinical Impression Statement Patient motivated and participated well within session. He does exhibit increased left lateral lean this session which usually indicates increased fatigue. Patient initially denies any fatigue but during session he does fatigue quickly. Patient was instructed in advanced transfers, with increased repetition he exhibits better neutral weight shift with less left lateral lean; He did fatigue quickly requiring short seated rest break. insructed patient in seated LE strengthening exercise and trunk control during seated rest break to help conserve energy. He would benefit from additional skilled PT intervention to improve strength, balance and mobility;    Personal Factors and Comorbidities Age;Comorbidity 3+    Comorbidities Relevant past medical history and comorbidities include long term steroid use for lupus, CKD, chronic osteomyelitis, cervical spine stenosis, BPH, APS, alcohol abuse, peripheral artery disease, depression, GERD, obstructive sleep apnea, HTN, IBS, IgA deficiency, lumbar radiculopathy, RA, Stroke, toxic maculopathy in both eyes, systemic lupus erythematosus related  syndrome, cardiac catheterization, carotid endarterectomy, cervical laminectomy, coronary angioplasty, Fusion C5-C7, knee arthroplasty, lumbar surgery.    Examination-Activity Limitations Bed Mobility;Bend;Sit;Toileting;Stand;Stairs;Lift;Transfers;Squat;Locomotion Level;Carry;Dressing;Hygiene/Grooming;Continence    Examination-Participation Restrictions Yard Work;Interpersonal Relationship;Community Activity    Stability/Clinical Decision Making Evolving/Moderate complexity    Rehab Potential Fair    PT Frequency 3x / week    PT Duration 12 weeks    PT Treatment/Interventions ADLs/Self Care Home Management;Electrical Stimulation;Moist Heat;Cryotherapy;Gait training;Stair training;Functional mobility training;Therapeutic exercise;Balance training;Neuromuscular re-education;Cognitive remediation;Patient/family education;Orthotic Fit/Training;Wheelchair mobility training;Manual techniques;Passive range of motion;Energy conservation;Joint Manipulations;Canalith Repostioning;Therapeutic activities;Vestibular    PT Next Visit Plan Work on strength, sitting/standing balance, functional activities such as rolling, bed mobility, transfers; caregiver training    PT Home Exercise Plan no updates this session    Consulted and Agree with Plan of Care Patient;Family member/caregiver    Family Member Consulted DTR           Patient will benefit from skilled therapeutic intervention in order to improve the following deficits and impairments:  Abnormal gait, Decreased activity tolerance, Decreased cognition, Decreased endurance, Decreased knowledge of use of DME, Decreased range of motion, Decreased skin integrity, Decreased strength, Impaired perceived functional ability, Impaired sensation, Impaired UE functional use, Improper body mechanics, Pain, Cardiopulmonary status limiting activity, Decreased balance, Decreased  coordination, Decreased mobility, Difficulty walking, Impaired tone, Postural dysfunction,  Dizziness  Visit Diagnosis: Muscle weakness (generalized)  Other lack of coordination  Difficulty in walking, not elsewhere classified  Repeated falls     Problem List Patient Active Problem List   Diagnosis Date Noted  . Sepsis ([place]) 10/18/2018    Tonimarie Gritz PT, DPT 11/13/2019, 2:44 PM  [place] [facility] SERVICES [address] [place], [place], [address] Phone: [phone]   Fax:  [phone]  Name: ROLIN SCHULT MRN: [id number] Date of Birth: [birth year]

## 2019-11-17 ENCOUNTER — Ambulatory Visit: Payer: Medicare Other | Admitting: Occupational Therapy

## 2019-11-17 ENCOUNTER — Ambulatory Visit: Payer: Medicare Other | Admitting: Physical Therapy

## 2019-11-19 ENCOUNTER — Ambulatory Visit: Payer: Medicare Other | Admitting: Physical Therapy

## 2019-11-19 ENCOUNTER — Ambulatory Visit: Payer: Medicare Other | Admitting: Occupational Therapy

## 2019-11-20 ENCOUNTER — Ambulatory Visit: Payer: Medicare Other | Admitting: Occupational Therapy

## 2019-11-20 ENCOUNTER — Ambulatory Visit: Payer: Medicare Other | Admitting: Physical Therapy

## 2019-11-20 ENCOUNTER — Encounter: Payer: Self-pay | Admitting: Physical Therapy

## 2019-11-20 ENCOUNTER — Other Ambulatory Visit: Payer: Self-pay

## 2019-11-20 ENCOUNTER — Encounter: Payer: Self-pay | Admitting: Occupational Therapy

## 2019-11-20 DIAGNOSIS — M6281 Muscle weakness (generalized): Secondary | ICD-10-CM

## 2019-11-20 DIAGNOSIS — R296 Repeated falls: Secondary | ICD-10-CM

## 2019-11-20 DIAGNOSIS — R278 Other lack of coordination: Secondary | ICD-10-CM

## 2019-11-20 DIAGNOSIS — R262 Difficulty in walking, not elsewhere classified: Secondary | ICD-10-CM

## 2019-11-20 NOTE — Patient Instructions (Signed)
Practice sitting unsupported in chair: -sit up tall with hands on knees, move "back rest only" backward until back is unsupported Goal is to sit upright for 10-20 min; Start with 2 min and gradually increase Do this 2-3 times a day   Also work on bring left leg in and out (squeezing knees together and pushing legs apart, x10 reps 2-3 times a day;

## 2019-11-20 NOTE — Therapy (Signed)
[place] MAIN [facility] SERVICES [address] [place], [place], [address] Phone: [phone]   Fax:  [phone]  Physical Therapy Treatment  Patient Details  Name: Kenneth Summers MRN: [id number] Date of Birth: 07/21/1945 No data recorded  Encounter Date: 11/20/2019   PT End of Session - 11/20/19 1302    Visit Number [id number]    Number of Visits 252    Date for PT Re-Evaluation 02/02/20    PT Start Time 1346    PT Stop Time 1430    PT Time Calculation (min) 44 min    Equipment Utilized During Treatment Gait belt    Activity Tolerance Patient tolerated treatment well;No increased pain    Behavior During Therapy WFL for tasks assessed/performed           Past Medical History:  Diagnosis Date  . Actinic keratosis   . Alcohol abuse   . APS (antiphospholipid syndrome) ([place])   . Basal cell carcinoma   . BPH (benign prostatic hyperplasia)   . CAD (coronary artery disease)   . Cellulitis   . Cervical spinal stenosis   . Chronic osteomyelitis ([place])   . CKD (chronic kidney disease)   . CKD (chronic kidney disease)   . Clostridium difficile diarrhea   . Depression   . Diplopia   . Fatigue   . GERD (gastroesophageal reflux disease)   . Hyperlipemia   . Hypertension   . Hypovitaminosis D   . IBS (irritable bowel syndrome)   . 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 Insomnia   . Left lumbosacral radiculopathy   . Moderate obstructive sleep apnea   . Osteomyelitis of foot ([place])   . PAD (peripheral artery disease) ([place])   . Pruritus   . RA (rheumatoid arthritis) ([place])   . Radiculopathy   . Restless legs syndrome   . RLS (restless legs syndrome)   . SLE (systemic lupus erythematosus related syndrome) ([place])   . 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 Stroke ([place])   . Toxic maculopathy of both eyes     Past Surgical History:  Procedure Laterality Date  . CARDIAC CATHETERIZATION    . CAROTID ENDARTERECTOMY    .  CERVICAL LAMINECTOMY    . CORONARY ANGIOPLASTY    . FRACTURE SURGERY    . fusion C5-6-7    . HERNIA REPAIR    . KNEE ARTHROSCOPY    . TONSILLECTOMY      There were no vitals filed for this visit.   Subjective Assessment - 11/20/19 1348    Subjective Patient reports doing well; Denies any pain; Reports doing some LE exercise including marches over the last week;    Pertinent History Patient is a 74 y.o. male who presents to outpatient physical therapy with a referral for medical diagnosis of CVA. This patient's chief complaints consist of left hemiplegia and overall deconditioning leading to the following functional deficits: dependent for ADLs, IADLs, unable to transfer to car, dependent transfers at home with hoyer lift, unable to walk or stand without significant assistance, difficulty with W/C navigation..    Diagnostic tests Brain MRI 11/01/2017: IMPRESSION: 1. Acute right ACA territory infarct as demonstrated on prior CT imaging. No intracranial hemorrhage or significant mass effect. 2. 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    Patient Stated Goals wants to be able to walk again    Currently in Pain? No/denies  TREATMENT: Prior to transfersand in between transfers: Patient sittingin wheelchair: -LLE only LAQx15with visual/tactile cues for increased AROM -arms in lap, resting UE, erect posture,deep breaths x2 min with verbal and tactile cues for neutral weight shift; -attempted LLE hip adduction/abduction trying to isolate LLE movement with increased difficulty;  Required min VCs and visual cues for better AROM to increase strengthening;  Ptrequires verbal and visual cues to improve ROM;He was able to exhibit improved LLE AROM with cues; Patient also requires cues for erect posture for better trunk control;Patient exhibits improved neutral position this session with less lateral trunk lean;  Following  exercise: Transferred sit<>Stand in parallel bars (seat adjusted to27inch from floor height) X6repswith min A- CGA;herequiredmodVCs for forward weight shift for better trunk control and transfer ability; Patient able to exhibit better transfer ability with increased forward weight shiftwith less posterior loss of balance.   Patient required min Ato CGA for standing posture. He exhibits increased left sided lean this session and increased posterior lean; Patient appears more fatigued today;   While standing: -mini squatx15reps with min A for safety; -side/side weight shift x10 reps; -standing supported x20-30 sec with cues for neutral weight shift;  -RLE hip flexion marchx10reps, with min A and therapist blocking LLE knee; patient does exhibit heavy posterior lean with increased repetition;   Patient requiredmodA to scoot back in chair at end of session due to increased fatigue  Educated patient in HEP, see patient instructions;   Patient more fatigued this session with increased left lateral lean; He does report increased LUE fatigue which could be contributing to lateral lean. Patient was able to find neutral in standing with cues with increased standing repetition. He does fatigue quickly with increased shortness of breath requiring short seated rest break;                      PT Education - 11/20/19 1301    Education Details transfers/positioning, LE strengthening, HEP    Person(s) Educated Patient;Child(ren)    Methods Explanation;Verbal cues    Comprehension Verbalized understanding;Returned demonstration;Verbal cues required;Need further instruction            PT Short Term Goals - 08/18/19 1305      PT SHORT TERM GOAL #1   Title Be independent with initial home exercise program for self-management of symptoms.    Baseline HEP to be given at second session; advice to practice unsupported sitting at home (06/19/2018); 07/08/18: Performing  at home, 6/29: needs assistance but is doing exercise program regularly; 08/22/18: adherent;    Time 2    Period Weeks    Status Achieved    Target Date 06/26/18             PT Long Term Goals - 11/10/19 1323      PT LONG TERM GOAL #1   Title Patient will complete all bed mobility with min A to improve functional independence for getting in and out of bed and adjusting in bed.     Baseline max A - total A reported by family (06/19/2018); 07/08/18: still requires maxA, dtr reports improvement in rolling since starting therapy; 6/23: min A to supervision in clinic; not doing at home right; 8/5: Pt's daughter states that his rolling has improved, but still has difficulty with all other bed mobility; 10/23/18: pts daughter states that she is doing about 35% of the work if he is in proper position, he does uses the bed rails to help roll;  using  Hoyer for coming to sit EOB; 10/12: pts daughter states that she is doing about 30% of the work if he is in proper position, he does uses the bed rails to help roll, but it is improving;  using Harrel Lemon for coming to sit EOB, 10/29: min A for sit to sidelying, rolling supervision, able to transition sidelying to sit with HHA;    Time 12    Period Weeks    Status Achieved    Target Date 02/02/20      PT LONG TERM GOAL #2   Title Patient will complete sit <> stand transfer chair to chair with Min A and LRAD to improve functional independence for household and community mobility.     Baseline supervision for STS, still requires elevated surface height to 27"    Time 12    Period Weeks    Status Achieved    Target Date 02/02/20      PT LONG TERM GOAL #3   Title Patient will navigate power w/c with min A x 100 feet to improve mobility for household and short community distances.    Baseline required total A (06/12/2018); able to roll 20 feet with min A (06/20/2018); 07/08/18: Pt can go approximately 10' without assist per family;  10/23/2018: pt's daughter states that  his power WC mobility has become 100% better with the new hand control, slowed speed, and with practice; 10/12: pt's daughter reports that he can perform household WC mobility and limited community ambulation with minA    Time 12    Period Weeks    Status Achieved    Target Date 02/02/20      PT LONG TERM GOAL #4   Title Patient will complete car transfers W/C to family SUV with min A using LRAD to improve his abilty to participate in community activities.     Baseline unable to complete car transfers at all (06/12/2018); unable to complete car transfers at all (06/19/2018).; 07/08/18: Unable to perform at this time; 09/11/18: unable to perform at this time; 10/23/2018: unable to complete car transfers at all; 10/12:  unable to complete car transfers at all, 10/29: unable; 01/13/19: unable; 2/11: min-mod A +2, 3/10: min A+1    Time 12    Period Weeks    Status Achieved    Target Date 02/02/20      PT LONG TERM GOAL #5   Title Patient will ambulate at least 10 feet with min A using LRAD to improve mobility for household and community distances.    Baseline unable to take steps (06/12/2018); able to shift R leg forward and back with max A and RW at edge of plinth (06/19/2018); 07/08/18: unable to ambulate at this time. 09/11/18: unable to perform at this time; 10/23/2018: unable to perform at this time; 10/12: unable to perform at this time, 10/29: unable at this time; 01/13/19: unable at this time, 2/11: min A to step with RLE, unable to step with LLE, 3/10: able to take 2 steps in parallel bars with min A for safety; 4/1: min A for walking 10 feet in parallel bars, 5/24: min A walking 10 feet in parallel bars; 6/21: no change 7/14: no change    Time 12    Period Weeks    Status Partially Met    Target Date 02/02/20      PT LONG TERM GOAL #6   Title Patient will be able to safety navigate up/down ramp with power chair, exhibiting good safety awareness,  mod I to safely enter/exit his home.    Baseline 2/11:  supervision with intermittent min A;4/1: supervision; 5/24: supervision    Time 12    Period Weeks    Status Partially Met    Target Date 02/02/20      PT LONG TERM GOAL #7   Title Patient will increase functional reach test to >15 inches in sitting to exhibit improved sitting balance and positioning and reduce fall risk;    Baseline 07/30/18: 12 inches; 09/11/18: 10-12 inches; 10/23/18: 12-13 inch; 10/12: 14.5", 10/29: 15.5 inch    Time 4    Period Weeks    Status Achieved    Target Date 02/02/20      PT LONG TERM GOAL #8   Title Patient will increase LLE quad strength to 3+/5 to improve functional strength for standing and mobility;    Baseline 10/28: 2+/5; 01/13/19: 2+/5, 2/11: 3-/5, 04/16/19: 3-/5, 05/08/19: 3-/5, 5/24: 3-/5, 6/21: 3-/5, 7/14: 3-/5    Time 12    Period Weeks    Status Partially Met    Target Date 02/02/20      PT LONG TERM GOAL  #9   TITLE Patient will report no vertigo with provoking motions or positions in order to be able to perform ADLs and mobility tasks without symptoms.    Baseline 06/30/19: reports no dizziness in last week; 6/21: one episode this weekend; 7/14: experiencing one a week;    Time 12    Period Weeks    Status Partially Met    Target Date 02/02/20                 Plan - 11/20/19 1424    Clinical Impression Statement Patient motivated and participated well within session. He was instructed in advanced LE strengthening exercise. instructed patient to work on LLE hip abduction/adduction for increased LE strengthening. patient also instructed in sitting forward in wheelchair to challenge trunk control and balance for increased endurance. Advanced HEP, see patient instructions. Patient was able to exhibit better forward lean with transfers this session. initially he exhibits increased left lateral lean requiring min A for shift to neutral, this improved with increased repetition. patient does fatigue quickly requiring short seated rest breaks. He  would benefit from additional skilled PT Intervention to improve strength, balance and mobility;    Personal Factors and Comorbidities Age;Comorbidity 3+    Comorbidities Relevant past medical history and comorbidities include long term steroid use for lupus, CKD, chronic osteomyelitis, cervical spine stenosis, BPH, APS, alcohol abuse, peripheral artery disease, depression, GERD, obstructive sleep apnea, HTN, IBS, IgA deficiency, lumbar radiculopathy, RA, Stroke, toxic maculopathy in both eyes, systemic lupus erythematosus related syndrome, cardiac catheterization, carotid endarterectomy, cervical laminectomy, coronary angioplasty, Fusion C5-C7, knee arthroplasty, lumbar surgery.    Examination-Activity Limitations Bed Mobility;Bend;Sit;Toileting;Stand;Stairs;Lift;Transfers;Squat;Locomotion Level;Carry;Dressing;Hygiene/Grooming;Continence    Examination-Participation Restrictions Yard Work;Interpersonal Relationship;Community Activity    Stability/Clinical Decision Making Evolving/Moderate complexity    Rehab Potential Fair    PT Frequency 3x / week    PT Duration 12 weeks    PT Treatment/Interventions ADLs/Self Care Home Management;Electrical Stimulation;Moist Heat;Cryotherapy;Gait training;Stair training;Functional mobility training;Therapeutic exercise;Balance training;Neuromuscular re-education;Cognitive remediation;Patient/family education;Orthotic Fit/Training;Wheelchair mobility training;Manual techniques;Passive range of motion;Energy conservation;Joint Manipulations;Canalith Repostioning;Therapeutic activities;Vestibular    PT Next Visit Plan Work on strength, sitting/standing balance, functional activities such as rolling, bed mobility, transfers; caregiver training    PT Home Exercise Plan no updates this session    Consulted and Agree with Plan of Care Patient;Family member/caregiver    Family  Member Consulted DTR           Patient will benefit from skilled therapeutic intervention in  order to improve the following deficits and impairments:  Abnormal gait, Decreased activity tolerance, Decreased cognition, Decreased endurance, Decreased knowledge of use of DME, Decreased range of motion, Decreased skin integrity, Decreased strength, Impaired perceived functional ability, Impaired sensation, Impaired UE functional use, Improper body mechanics, Pain, Cardiopulmonary status limiting activity, Decreased balance, Decreased coordination, Decreased mobility, Difficulty walking, Impaired tone, Postural dysfunction, Dizziness  Visit Diagnosis: Muscle weakness (generalized)  Other lack of coordination  Difficulty in walking, not elsewhere classified  Repeated falls     Problem List Patient Active Problem List   Diagnosis Date Noted  . Sepsis ([place]) 10/18/2018    Asim Gersten PT, DPT 11/20/2019, 3:35 PM  [place] MAIN [facility] SERVICES [address] [place], [place], [address] Phone: [phone]   Fax:  [phone]  Name: CURRY SEEFELDT MRN: [id number] Date of Birth: 1945-07-08

## 2019-11-20 NOTE — Therapy (Addendum)
[place] MAIN [facility] SERVICES [address] [place], [place], [address] Phone: [phone]   Fax:  [phone]  Occupational Therapy Treatment  Patient Details  Name: Kenneth Summers MRN: [id number] Date of Birth: [birth year] No data recorded  Encounter Date: 11/20/2019   OT End of Session - 11/20/19 1308    Visit Number 146    Number of Visits 157    Date for OT Re-Evaluation 12/15/19    Authorization Type Progress report period starting 07/21/2019    OT Start Time 1300    OT Stop Time 1345    OT Time Calculation (min) 45 min    Activity Tolerance Patient limited by pain    Behavior During Therapy [facility] for tasks assessed/performed           Past Medical History:  Diagnosis Date  . Actinic keratosis   . Alcohol abuse   . APS (antiphospholipid syndrome) ([place])   . Basal cell carcinoma   . BPH (benign prostatic hyperplasia)   . CAD (coronary artery disease)   . Cellulitis   . Cervical spinal stenosis   . Chronic osteomyelitis ([place])   . CKD (chronic kidney disease)   . CKD (chronic kidney disease)   . Clostridium difficile diarrhea   . Depression   . Diplopia   . Fatigue   . GERD (gastroesophageal reflux disease)   . Hyperlipemia   . Hypertension   . Hypovitaminosis D   . IBS (irritable bowel syndrome)   . 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 Insomnia   . Left lumbosacral radiculopathy   . Moderate obstructive sleep apnea   . Osteomyelitis of foot ([place])   . PAD (peripheral artery disease) ([place])   . Pruritus   . RA (rheumatoid arthritis) ([place])   . Radiculopathy   . Restless legs syndrome   . RLS (restless legs syndrome)   . SLE (systemic lupus erythematosus related syndrome) ([place])   . 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 Stroke ([place])   . Toxic maculopathy of both eyes     Past Surgical History:  Procedure Laterality Date  . CARDIAC CATHETERIZATION    . CAROTID ENDARTERECTOMY    .  CERVICAL LAMINECTOMY    . CORONARY ANGIOPLASTY    . FRACTURE SURGERY    . fusion C5-6-7    . HERNIA REPAIR    . KNEE ARTHROSCOPY    . TONSILLECTOMY      There were no vitals filed for this visit.   Subjective Assessment - 11/20/19 1307    Subjective  Pt.'s daughter reports that pt. had an episode of confusion while waiting for th bus today.    Patient is accompanied by: Family member    Pertinent History Pt. is a 74 y.o. 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 Pt. PMHx includes: Multiple Falls, Lupus, DJD, Renal Abscess, CVA. Pt. resides with his wife. Pt.'s wife and daughter assist with ADLs. Pt. has caregivers in  for 2 hours a day, 6 days a week. Pt. received Rehab services in acute care, at SNF for STR, and [place] services. Pt. is retired from [employer] for [employer] and [employer].    Patient Stated Goals To regain use of his left UE, and do more for himself.    Currently in Pain? No/denies          OT TREATMENT   Therapeutic Exercise:  Pt. performed 2.5# dowel ex.for UE strengthening secondary to weakness. Pt. Tolerated increased dowel weight. Bilateral shoulder flexion, chest press, and circular patterns were performed for 2 sets 20 reps each.3# dumbbell weightsfor elbow flexion, extension, and forearm supination/pronation, 2# for wrist flexion/extension, and radial deviation. Pt. requires rest breaks and verbal cues for proper technique.  No episodes of confusion noted during the session today. Pt. was able to tolerate increased hand weight today. No reports of pain, or discomfort.Pt. tolerated UE strengthening, and theUBE today.Pt.continues to present with weakness, as well as impaired motor control, and motor planning.Pt. tolerated BUE exercises well, with no reportsofdiscomfort. Pt. required consistent verbal, visual, and tactile cues, however required less cuesto perform UE strengthening exercises, technique, and formas  well as visual demonstration prior to the exercises.Pt. continues to work on improving BUE strength, and [facility] skills in order to improve LUE functioning during ADLs, IADLs, and tranfers.                       OT Education - 11/20/19 1308    Education Details BUE Therapeutic ex    Person(s) Educated Patient    Methods Explanation;Demonstration    Comprehension Returned demonstration;Tactile cues required;Verbalized understanding;Need further instruction               OT Long Term Goals - 10/23/19 1320      OT LONG TERM GOAL #1   Title Pt. will increase left shoulder flexion AROM by 10 degrees to access cabinet/shelf.    Baseline Pt. continues to progress with shoulder ROM. Marland KitchenShouler flexion 130, and continues to work on reaching with increasing emphasis on flexing trunk to improve functional reach.  07/21/19- shoulder flexion to 130 with effort    Time 12    Period Weeks    Status Partially Met    Target Date 12/15/19      OT LONG TERM GOAL #2   Title Pt. will donn a shirt with Supervision.    Baseline Pt. continues to require MinA donning a zip down jacket with consistent cues to initiate the correct direction of the jacket.  MinA for donning a T-shirt with assist to roll side to side to pull the shirt down over his back. 07/21/19 continues to require min assist at times for shirt/jacket    Time 12    Period Weeks    Status On-going    Target Date 12/15/19      OT LONG TERM GOAL #3   Title Pt. will require ModA to perform LE dressing    Baseline MaxA left, ModA with the right. Pt. has sores on his ankle, and feet. Pt.'s daughter is assisting.    Time 12    Period Weeks    Status Deferred    Target Date 12/15/19      OT LONG TERM GOAL #4   Title Pt. will improve left grip strength by 10# to be able to open a jar/container.    Baseline Pt. continues to present with limited left hand grip strength. Pt. continues to work towards opening containers, and  bottles.    Time 12    Period Weeks    Status On-going    Target Date 02/14/19      OT LONG TERM GOAL #5   Title Pt. will improve left hand [facility] skills to be able to assist with buttoning/zipping.    Baseline Pt. is improving with manipulating the zipper on his jacket, however continues to require practice.  Pt. continues to work on improving bilateral [facility] skills for buttoning/zipping.  07/21/19 improving with buttons and zippers.    Time 12    Period Weeks    Status On-going    Target Date 12/15/19      OT LONG TERM GOAL #6   Title Pt. will demonstrate visual conmpensatory strategies for 100% of the time during ADLs    Baseline Pt.continues to require intermitten cues cues for left sides awareness.    Time 12    Period Weeks    Status Partially Met    Target Date 12/15/19      OT LONG TERM GOAL #7   Title Pt. will prepare a simple cold snack from w/c with supervision using cognitive compensatory strategies 100% of the time.    Baseline Pt. has difficulty with getting the w/c close enough to access the refrigerator    Time 12    Period Weeks    Status Deferred      OT LONG TERM GOAL #8   Title Pt. will accurately identify potential safety hazard using good safety awareness, and judgement 100% for ADLs, and IADLs.    Baseline Pt. requires cues    Time 12    Period Weeks    Status On-going    Target Date 12/15/19      OT LONG TERM GOAL  #9   TITLE Pt. will  navigate his w/c around obstacles with Supervision and 100% accuracy    Baseline Pt. continues to require cuing for obstacles on the left, however requires less cues overall. Pt. requires significant cues to engage his left UE during tasks. 07/21/2019 continues to improve with decreased assist at times    Time 12    Period Weeks    Status On-going    Target Date 12/15/19      OT LONG TERM GOAL  #10   TITLE Pt. will independently be able  to reach to place items into cabinetry, and closets.    Baseline Pt. BUE strength is  improving. UE functional reaching conitnues to be limited.    Time 12    Period Weeks    Status On-going    Target Date 12/15/19      OT LONG TERM GOAL  #11   TITLE Pt. will increase BUE strength by 31mm grades in preparation for ADL transfers.    Baseline Pt. continues to work towards improving overall strength,    Time 12    Period Weeks    Status On-going    Target Date 12/15/19      OT LONG TERM GOAL  #12   TITLE Pt. will improve LUE functional reaching with the left engaging the trunk 100% of the time during ADL tasks with minimal cues.    Baseline Pt. conitnues to improve with LUE functional reaching. Pt. conitnues to require cues to engage his trunk for extended reaching.    Time 12    Period Weeks    Status On-going    Target Date 12/15/19                 Plan - 11/20/19 1309    Clinical Impression Statement Pt. was able to tolerate increased hand weight today. No reports of pain, or discomfort.Pt. tolerated UE strengthening, and theUBE today.Pt.continues to present with weakness, as well as impaired motor control, and motor planning.Pt. tolerated BUE exercises well, with no reportsofdiscomfort. Pt. required consistent verbal, visual, and tactile cues, however required less cuesto perform UE  strengthening exercises, technique, and formas well as visual demonstration prior to the exercises.Pt. continues to work on improving BUE strength, and [facility] skills in order to improve LUE functioning during ADLs, IADLs, and tranfers.   OT Occupational Profile and History Problem Focused Assessment - Including review of records relating to presenting problem    Occupational performance deficits (Please refer to evaluation for details): ADL's    Body Structure / Function / Physical Skills UE functional use;Coordination;FMC;Dexterity;Strength;ROM    Cognitive Skills Attention;Memory;Emotional;Problem Solve;Safety Awareness    Rehab Potential Good    Clinical Decision Making  Several treatment options, min-mod task modification necessary    Comorbidities Affecting Occupational Performance: Presence of comorbidities impacting occupational performance    Comorbidities impacting occupational performance description: Phyical, cognitive, visual,  medical comorbidities    Modification or Assistance to Complete Evaluation  No modification of tasks or assist necessary to complete eval    OT Frequency 2x / week    OT Duration 12 weeks    OT Treatment/Interventions Self-care/ADL training;DME and/or AE instruction;Therapeutic exercise;Therapeutic activities;Moist Heat;Cognitive remediation/compensation;Neuromuscular education;Visual/perceptual remediation/compensation;Coping strategies training;Patient/family education;Passive range of motion;Psychosocial skills training;Energy conservation;Functional Mobility Training    Consulted and Agree with Plan of Care Patient;Family member/caregiver           Patient will benefit from skilled therapeutic intervention in order to improve the following deficits and impairments:   Body Structure / Function / Physical Skills: UE functional use, Coordination, FMC, Dexterity, Strength, ROM Cognitive Skills: Attention, Memory, Emotional, Problem Solve, Safety Awareness     Visit Diagnosis: Muscle weakness (generalized)    Problem List Patient Active Problem List   Diagnosis Date Noted  . Sepsis ([place]) 10/18/2018    Harrel Carina, MS, OTR/L 11/20/2019, 1:41 PM  [place] MAIN [facility] SERVICES [address] [place], [place], [address] Phone: [phone]   Fax:  [phone]  Name: Kenneth Summers MRN: [id number] Date of Birth: Jun 21, 1945

## 2019-11-24 ENCOUNTER — Ambulatory Visit: Payer: Medicare Other | Admitting: Physical Therapy

## 2019-11-24 ENCOUNTER — Ambulatory Visit: Payer: Medicare Other | Admitting: Occupational Therapy

## 2019-11-24 ENCOUNTER — Encounter: Payer: Medicare Other | Admitting: Occupational Therapy

## 2019-11-25 ENCOUNTER — Ambulatory Visit: Payer: Medicare Other | Admitting: Dermatology

## 2019-11-26 ENCOUNTER — Ambulatory Visit: Payer: Medicare Other | Admitting: Occupational Therapy

## 2019-11-26 ENCOUNTER — Ambulatory Visit: Payer: Medicare Other | Admitting: Physical Therapy

## 2019-11-27 ENCOUNTER — Ambulatory Visit: Payer: Medicare Other | Admitting: Physical Therapy

## 2019-11-27 ENCOUNTER — Ambulatory Visit: Payer: Medicare Other | Admitting: Occupational Therapy

## 2019-11-27 ENCOUNTER — Encounter: Payer: Self-pay | Admitting: Physical Therapy

## 2019-11-27 ENCOUNTER — Other Ambulatory Visit: Payer: Self-pay

## 2019-11-27 ENCOUNTER — Encounter: Payer: Self-pay | Admitting: Occupational Therapy

## 2019-11-27 DIAGNOSIS — R262 Difficulty in walking, not elsewhere classified: Secondary | ICD-10-CM

## 2019-11-27 DIAGNOSIS — R296 Repeated falls: Secondary | ICD-10-CM

## 2019-11-27 DIAGNOSIS — M6281 Muscle weakness (generalized): Secondary | ICD-10-CM

## 2019-11-27 DIAGNOSIS — R278 Other lack of coordination: Secondary | ICD-10-CM

## 2019-11-27 NOTE — Therapy (Addendum)
[place] MAIN [facility] SERVICES [address] [place], [place], [address] Phone: [phone]   Fax:  [phone]  Occupational Therapy Treatment  Patient Details  Name: Kenneth Summers MRN: [id number] Date of Birth: [birth year] No data recorded  Encounter Date: 11/27/2019   OT End of Session - 11/27/19 1325    Visit Number 147    Number of Visits 157    Date for OT Re-Evaluation 12/15/19    Authorization Type Progress report period starting 07/21/2019    OT Start Time 1300    OT Stop Time 1345    OT Time Calculation (min) 45 min    Activity Tolerance Patient limited by pain    Behavior During Therapy [facility] for tasks assessed/performed           Past Medical History:  Diagnosis Date  . Actinic keratosis   . Alcohol abuse   . APS (antiphospholipid syndrome) ([place])   . Basal cell carcinoma   . BPH (benign prostatic hyperplasia)   . CAD (coronary artery disease)   . Cellulitis   . Cervical spinal stenosis   . Chronic osteomyelitis ([place])   . CKD (chronic kidney disease)   . CKD (chronic kidney disease)   . Clostridium difficile diarrhea   . Depression   . Diplopia   . Fatigue   . GERD (gastroesophageal reflux disease)   . Hyperlipemia   . Hypertension   . Hypovitaminosis D   . IBS (irritable bowel syndrome)   . 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 Insomnia   . Left lumbosacral radiculopathy   . Moderate obstructive sleep apnea   . Osteomyelitis of foot ([place])   . PAD (peripheral artery disease) ([place])   . Pruritus   . RA (rheumatoid arthritis) ([place])   . Radiculopathy   . Restless legs syndrome   . RLS (restless legs syndrome)   . SLE (systemic lupus erythematosus related syndrome) ([place])   . 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 Stroke ([place])   . Toxic maculopathy of both eyes     Past Surgical History:  Procedure Laterality Date  . CARDIAC CATHETERIZATION    . CAROTID ENDARTERECTOMY    .  CERVICAL LAMINECTOMY    . CORONARY ANGIOPLASTY    . FRACTURE SURGERY    . fusion C5-6-7    . HERNIA REPAIR    . KNEE ARTHROSCOPY    . TONSILLECTOMY      There were no vitals filed for this visit.   Subjective Assessment - 11/27/19 1324    Subjective  Pt. reports that he has a new caregiver    Patient is accompanied by: Family member    Pertinent History Pt. is a 74 y.o. 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 Pt. PMHx includes: Multiple Falls, Lupus, DJD, Renal Abscess, CVA. Pt. resides with his wife. Pt.'s wife and daughter assist with ADLs. Pt. has caregivers in  for 2 hours a day, 6 days a week. Pt. received Rehab services in acute care, at SNF for STR, and [place] services. Pt. is retired from [employer] for [employer] and [employer].    Patient Stated Goals To regain use of his left UE, and do more for himself.    Currently in Pain? No/denies          OT TREATMENT   Therapeutic Exercise:  Pt. performed 2# dowel ex., followed By 2.5#for  UE strengthening secondary to weakness. Pt. Tolerated increased dowel weight. Bilateral shoulder flexion, chest press,andcircular patterns were performed for2 sets 20 reps each.4# dumbbell weightsforelbow flexion, extension, 3# forearm supination/pronation, wrist flexion/extension, and radial deviation. Pt. requires rest breaks and verbal cues for proper technique.  Pt. Continues to tolerate increased hand weight today. No reports of pain, or discomfort.Pt. tolerated UE strengthening, and theUBE today.Pt.continues to present with weakness, as well as impaired motor control, and motor planning.Pt. tolerated BUE exercises well, with no reportsofdiscomfort. Pt. required consistent verbal, visual, and tactile cues, however required less cuesto perform UE strengthening exercises, technique, and formas well as visual demonstration prior to the exercises.Pt. continues to work on improving BUE  strength, and [facility] skills in order to improve LUE functioning during ADLs, IADLs, and tranfers.                        OT Education - 11/27/19 1325    Education Details BUE Therapeutic ex    Person(s) Educated Patient    Methods Explanation;Demonstration    Comprehension Returned demonstration;Tactile cues required;Verbalized understanding;Need further instruction               OT Long Term Goals - 10/23/19 1320      OT LONG TERM GOAL #1   Title Pt. will increase left shoulder flexion AROM by 10 degrees to access cabinet/shelf.    Baseline Pt. continues to progress with shoulder ROM. Marland KitchenShouler flexion 130, and continues to work on reaching with increasing emphasis on flexing trunk to improve functional reach.  07/21/19- shoulder flexion to 130 with effort    Time 12    Period Weeks    Status Partially Met    Target Date 12/15/19      OT LONG TERM GOAL #2   Title Pt. will donn a shirt with Supervision.    Baseline Pt. continues to require MinA donning a zip down jacket with consistent cues to initiate the correct direction of the jacket.  MinA for donning a T-shirt with assist to roll side to side to pull the shirt down over his back. 07/21/19 continues to require min assist at times for shirt/jacket    Time 12    Period Weeks    Status On-going    Target Date 12/15/19      OT LONG TERM GOAL #3   Title Pt. will require ModA to perform LE dressing    Baseline MaxA left, ModA with the right. Pt. has sores on his ankle, and feet. Pt.'s daughter is assisting.    Time 12    Period Weeks    Status Deferred    Target Date 12/15/19      OT LONG TERM GOAL #4   Title Pt. will improve left grip strength by 10# to be able to open a jar/container.    Baseline Pt. continues to present with limited left hand grip strength. Pt. continues to work towards opening containers, and bottles.    Time 12    Period Weeks    Status On-going    Target Date 02/14/19      OT  LONG TERM GOAL #5   Title Pt. will improve left hand [facility] skills to be able to assist with buttoning/zipping.    Baseline Pt. is improving with manipulating the zipper on his jacket, however continues to require practice. Pt. continues to work on improving bilateral [facility] skills for buttoning/zipping.  07/21/19 improving with buttons and zippers.  Time 12    Period Weeks    Status On-going    Target Date 12/15/19      OT LONG TERM GOAL #6   Title Pt. will demonstrate visual conmpensatory strategies for 100% of the time during ADLs    Baseline Pt.continues to require intermitten cues cues for left sides awareness.    Time 12    Period Weeks    Status Partially Met    Target Date 12/15/19      OT LONG TERM GOAL #7   Title Pt. will prepare a simple cold snack from w/c with supervision using cognitive compensatory strategies 100% of the time.    Baseline Pt. has difficulty with getting the w/c close enough to access the refrigerator    Time 12    Period Weeks    Status Deferred      OT LONG TERM GOAL #8   Title Pt. will accurately identify potential safety hazard using good safety awareness, and judgement 100% for ADLs, and IADLs.    Baseline Pt. requires cues    Time 12    Period Weeks    Status On-going    Target Date 12/15/19      OT LONG TERM GOAL  #9   TITLE Pt. will  navigate his w/c around obstacles with Supervision and 100% accuracy    Baseline Pt. continues to require cuing for obstacles on the left, however requires less cues overall. Pt. requires significant cues to engage his left UE during tasks. 07/21/2019 continues to improve with decreased assist at times    Time 12    Period Weeks    Status On-going    Target Date 12/15/19      OT LONG TERM GOAL  #10   TITLE Pt. will independently be able  to reach to place items into cabinetry, and closets.    Baseline Pt. BUE strength is improving. UE functional reaching conitnues to be limited.    Time 12    Period Weeks     Status On-going    Target Date 12/15/19      OT LONG TERM GOAL  #11   TITLE Pt. will increase BUE strength by 41m grades in preparation for ADL transfers.    Baseline Pt. continues to work towards improving overall strength,    Time 12    Period Weeks    Status On-going    Target Date 12/15/19      OT LONG TERM GOAL  #12   TITLE Pt. will improve LUE functional reaching with the left engaging the trunk 100% of the time during ADL tasks with minimal cues.    Baseline Pt. conitnues to improve with LUE functional reaching. Pt. conitnues to require cues to engage his trunk for extended reaching.    Time 12    Period Weeks    Status On-going    Target Date 12/15/19                 Plan - 11/27/19 1326    Clinical Impression Statement Pt. Continues to tolerate increased hand weight today. No reports of pain, or discomfort.Pt. tolerated UE strengthening, and theUBE today.Pt.continues to present with weakness, as well as impaired motor control, and motor planning.Pt. tolerated BUE exercises well, with no reportsofdiscomfort. Pt. required consistent verbal, visual, and tactile cues, however required less cuesto perform UE strengthening exercises, technique, and formas well as visual demonstration prior to the exercises.Pt. continues to work on improving BUE strength, and [facility]  skills in order to improve LUE functioning during ADLs, IADLs, and tranfers.   OT Occupational Profile and History Problem Focused Assessment - Including review of records relating to presenting problem    Occupational performance deficits (Please refer to evaluation for details): ADL's    Body Structure / Function / Physical Skills UE functional use;Coordination;FMC;Dexterity;Strength;ROM    Cognitive Skills Attention;Memory;Emotional;Problem Solve;Safety Awareness    Rehab Potential Good    Clinical Decision Making Several treatment options, min-mod task modification necessary    Comorbidities Affecting  Occupational Performance: Presence of comorbidities impacting occupational performance    Comorbidities impacting occupational performance description: Phyical, cognitive, visual,  medical comorbidities    Modification or Assistance to Complete Evaluation  No modification of tasks or assist necessary to complete eval    OT Frequency 2x / week    OT Duration 12 weeks    OT Treatment/Interventions Self-care/ADL training;DME and/or AE instruction;Therapeutic exercise;Therapeutic activities;Moist Heat;Cognitive remediation/compensation;Neuromuscular education;Visual/perceptual remediation/compensation;Coping strategies training;Patient/family education;Passive range of motion;Psychosocial skills training;Energy conservation;Functional Mobility Training    Consulted and Agree with Plan of Care Patient;Family member/caregiver    Family Member Consulted daughter [name]           Patient will benefit from skilled therapeutic intervention in order to improve the following deficits and impairments:   Body Structure / Function / Physical Skills: UE functional use, Coordination, FMC, Dexterity, Strength, ROM Cognitive Skills: Attention, Memory, Emotional, Problem Solve, Safety Awareness     Visit Diagnosis: Muscle weakness (generalized)    Problem List Patient Active Problem List   Diagnosis Date Noted  . Sepsis ([place]) 10/18/2018    Harrel Carina, MS, OTR/L 11/27/2019, 1:29 PM  [place] MAIN [facility] SERVICES [address] [place], [place], [address] Phone: [phone]   Fax:  [phone]  Name: AYUB KIRSH MRN: [id number] Date of Birth: [birth year]

## 2019-11-27 NOTE — Therapy (Signed)
[place] MAIN [facility] SERVICES [address] [place], [place], [address] Phone: [phone]   Fax:  [phone]  Physical Therapy Treatment  Patient Details  Name: Kenneth Summers MRN: [id number] Date of Birth: September 06, 1945 No data recorded  Encounter Date: 11/27/2019   PT End of Session - 11/27/19 1336    Visit Number [id number]    Number of Visits 252    Date for PT Re-Evaluation 02/02/20    PT Start Time 1346    PT Stop Time 1430    PT Time Calculation (min) 44 min    Equipment Utilized During Treatment Gait belt    Activity Tolerance Patient tolerated treatment well;No increased pain    Behavior During Therapy WFL for tasks assessed/performed           Past Medical History:  Diagnosis Date  . Actinic keratosis   . Alcohol abuse   . APS (antiphospholipid syndrome) ([place])   . Basal cell carcinoma   . BPH (benign prostatic hyperplasia)   . CAD (coronary artery disease)   . Cellulitis   . Cervical spinal stenosis   . Chronic osteomyelitis ([place])   . CKD (chronic kidney disease)   . CKD (chronic kidney disease)   . Clostridium difficile diarrhea   . Depression   . Diplopia   . Fatigue   . GERD (gastroesophageal reflux disease)   . Hyperlipemia   . Hypertension   . Hypovitaminosis D   . IBS (irritable bowel syndrome)   . 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 Insomnia   . Left lumbosacral radiculopathy   . Moderate obstructive sleep apnea   . Osteomyelitis of foot ([place])   . PAD (peripheral artery disease) ([place])   . Pruritus   . RA (rheumatoid arthritis) ([place])   . Radiculopathy   . Restless legs syndrome   . RLS (restless legs syndrome)   . SLE (systemic lupus erythematosus related syndrome) ([place])   . 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 Stroke ([place])   . Toxic maculopathy of both eyes     Past Surgical History:  Procedure Laterality Date  . CARDIAC CATHETERIZATION    . CAROTID ENDARTERECTOMY    .  CERVICAL LAMINECTOMY    . CORONARY ANGIOPLASTY    . FRACTURE SURGERY    . fusion C5-6-7    . HERNIA REPAIR    . KNEE ARTHROSCOPY    . TONSILLECTOMY      There were no vitals filed for this visit.   Subjective Assessment - 11/27/19 1410    Subjective Patient reports doing well. he reports working on sitting forward in chair with increased fatigue but was able to work on it. He denies any new falls.    Pertinent History Patient is a 74 y.o. male who presents to outpatient physical therapy with a referral for medical diagnosis of CVA. This patient's chief complaints consist of left hemiplegia and overall deconditioning leading to the following functional deficits: dependent for ADLs, IADLs, unable to transfer to car, dependent transfers at home with hoyer lift, unable to walk or stand without significant assistance, difficulty with W/C navigation..    Diagnostic tests Brain MRI 11/01/2017: IMPRESSION: 1. Acute right ACA territory infarct as demonstrated on prior CT imaging. No intracranial hemorrhage or significant mass effect. 2. 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    Patient Stated Goals wants to be able to walk again  Currently in Pain? No/denies    Multiple Pain Sites No                   TREATMENT:  Transferred stand pivot wheelchair to [employer], with RW Min A +2  Nustep: BUE/BLE level 3 x10 min with cues for steps per minute >60 (averaged 50) with total distance ,0.28 miles   Transferred squat pivot nustep to wheelchair max A +2 with cues for hand placement  Ptrequires verbal and visual cues to improve ROM;He was able to exhibit improved LLE AROM with cues; Patient also requires cues for erect posture for better trunk control;  Following exercise: Attempted sit<>Stand transfers in parallel bars, Patient chair adjusted to 28 inch,  he was able to transfer sit<>Stand from elevated chair with min A +1 x1 reps;Patient required  min A for neutral weight shift with min VCs and tactile cues for hip extension and erect standing posture. In standing: Mini squat x15 reps with cues for increased knee flexion/extension for better strengthening;    Patient requiredmodA to scoot back in chair at end of session due to increased fatigue    Response to treatment:Patient tolerated session well.He does fatigue quickly.He was really tired after nustep but was able to exhibit better UE/LE movement. Patient did have increased difficulty with sit<>stand transfers following nustep due to fatigue                 PT Education - 11/27/19 1336    Education Details transfers/positioning, LE strengthening, HEP    Person(s) Educated Patient    Methods Explanation;Verbal cues    Comprehension Verbalized understanding;Returned demonstration;Verbal cues required;Need further instruction            PT Short Term Goals - 08/18/19 1305      PT SHORT TERM GOAL #1   Title Be independent with initial home exercise program for self-management of symptoms.    Baseline HEP to be given at second session; advice to practice unsupported sitting at home (06/19/2018); 07/08/18: Performing at home, 6/29: needs assistance but is doing exercise program regularly; 08/22/18: adherent;    Time 2    Period Weeks    Status Achieved    Target Date 06/26/18             PT Long Term Goals - 11/10/19 1323      PT LONG TERM GOAL #1   Title Patient will complete all bed mobility with min A to improve functional independence for getting in and out of bed and adjusting in bed.     Baseline max A - total A reported by family (06/19/2018); 07/08/18: still requires maxA, dtr reports improvement in rolling since starting therapy; 6/23: min A to supervision in clinic; not doing at home right; 8/5: Pt's daughter states that his rolling has improved, but still has difficulty with all other bed mobility; 10/23/18: pts daughter states that she is  doing about 35% of the work if he is in proper position, he does uses the bed rails to help roll;  using Hoyer for coming to sit EOB; 10/12: pts daughter states that she is doing about 30% of the work if he is in proper position, he does uses the bed rails to help roll, but it is improving;  using Hoyer for coming to sit EOB, 10/29: min A for sit to sidelying, rolling supervision, able to transition sidelying to sit with HHA;    Time 12    Period Weeks  Status Achieved    Target Date 02/02/20      PT LONG TERM GOAL #2   Title Patient will complete sit <> stand transfer chair to chair with Min A and LRAD to improve functional independence for household and community mobility.     Baseline supervision for STS, still requires elevated surface height to 27"    Time 12    Period Weeks    Status Achieved    Target Date 02/02/20      PT LONG TERM GOAL #3   Title Patient will navigate power w/c with min A x 100 feet to improve mobility for household and short community distances.    Baseline required total A (06/12/2018); able to roll 20 feet with min A (06/20/2018); 07/08/18: Pt can go approximately 10' without assist per family;  10/23/2018: pt's daughter states that his power WC mobility has become 100% better with the new hand control, slowed speed, and with practice; 10/12: pt's daughter reports that he can perform household WC mobility and limited community ambulation with minA    Time 12    Period Weeks    Status Achieved    Target Date 02/02/20      PT LONG TERM GOAL #4   Title Patient will complete car transfers W/C to family SUV with min A using LRAD to improve his abilty to participate in community activities.     Baseline unable to complete car transfers at all (06/12/2018); unable to complete car transfers at all (06/19/2018).; 07/08/18: Unable to perform at this time; 09/11/18: unable to perform at this time; 10/23/2018: unable to complete car transfers at all; 10/12:  unable to complete car  transfers at all, 10/29: unable; 01/13/19: unable; 2/11: min-mod A +2, 3/10: min A+1    Time 12    Period Weeks    Status Achieved    Target Date 02/02/20      PT LONG TERM GOAL #5   Title Patient will ambulate at least 10 feet with min A using LRAD to improve mobility for household and community distances.    Baseline unable to take steps (06/12/2018); able to shift R leg forward and back with max A and RW at edge of plinth (06/19/2018); 07/08/18: unable to ambulate at this time. 09/11/18: unable to perform at this time; 10/23/2018: unable to perform at this time; 10/12: unable to perform at this time, 10/29: unable at this time; 01/13/19: unable at this time, 2/11: min A to step with RLE, unable to step with LLE, 3/10: able to take 2 steps in parallel bars with min A for safety; 4/1: min A for walking 10 feet in parallel bars, 5/24: min A walking 10 feet in parallel bars; 6/21: no change 7/14: no change    Time 12    Period Weeks    Status Partially Met    Target Date 02/02/20      PT LONG TERM GOAL #6   Title Patient will be able to safety navigate up/down ramp with power chair, exhibiting good safety awareness, mod I to safely enter/exit his home.    Baseline 2/11: supervision with intermittent min A;4/1: supervision; 5/24: supervision    Time 12    Period Weeks    Status Partially Met    Target Date 02/02/20      PT LONG TERM GOAL #7   Title Patient will increase functional reach test to >15 inches in sitting to exhibit improved sitting balance and positioning and reduce fall  risk;    Baseline 07/30/18: 12 inches; 09/11/18: 10-12 inches; 10/23/18: 12-13 inch; 10/12: 14.5", 10/29: 15.5 inch    Time 4    Period Weeks    Status Achieved    Target Date 02/02/20      PT LONG TERM GOAL #8   Title Patient will increase LLE quad strength to 3+/5 to improve functional strength for standing and mobility;    Baseline 10/28: 2+/5; 01/13/19: 2+/5, 2/11: 3-/5, 04/16/19: 3-/5, 05/08/19: 3-/5, 5/24: 3-/5, 6/21:  3-/5, 7/14: 3-/5    Time 12    Period Weeks    Status Partially Met    Target Date 02/02/20      PT LONG TERM GOAL  #9   TITLE Patient will report no vertigo with provoking motions or positions in order to be able to perform ADLs and mobility tasks without symptoms.    Baseline 06/30/19: reports no dizziness in last week; 6/21: one episode this weekend; 7/14: experiencing one a week;    Time 12    Period Weeks    Status Partially Met    Target Date 02/02/20                 Plan - 11/27/19 1431    Clinical Impression Statement Patient motivated and participated well within session. He was able to progress on Nustep with increased distance propelled in 10 min compared to previous session. He does fatigue quickly with increased left lateral trunk lean. He required cues for neutral weight shift. Patient was able to stand up straight in parallel bars and complete mini squats with good neutral weight shift. He would benefit from additional skilled PT Intervention to improve strength, balance and mobility;    Personal Factors and Comorbidities Age;Comorbidity 3+    Comorbidities Relevant past medical history and comorbidities include long term steroid use for lupus, CKD, chronic osteomyelitis, cervical spine stenosis, BPH, APS, alcohol abuse, peripheral artery disease, depression, GERD, obstructive sleep apnea, HTN, IBS, IgA deficiency, lumbar radiculopathy, RA, Stroke, toxic maculopathy in both eyes, systemic lupus erythematosus related syndrome, cardiac catheterization, carotid endarterectomy, cervical laminectomy, coronary angioplasty, Fusion C5-C7, knee arthroplasty, lumbar surgery.    Examination-Activity Limitations Bed Mobility;Bend;Sit;Toileting;Stand;Stairs;Lift;Transfers;Squat;Locomotion Level;Carry;Dressing;Hygiene/Grooming;Continence    Examination-Participation Restrictions Yard Work;Interpersonal Relationship;Community Activity    Stability/Clinical Decision Making  Evolving/Moderate complexity    Rehab Potential Fair    PT Frequency 3x / week    PT Duration 12 weeks    PT Treatment/Interventions ADLs/Self Care Home Management;Electrical Stimulation;Moist Heat;Cryotherapy;Gait training;Stair training;Functional mobility training;Therapeutic exercise;Balance training;Neuromuscular re-education;Cognitive remediation;Patient/family education;Orthotic Fit/Training;Wheelchair mobility training;Manual techniques;Passive range of motion;Energy conservation;Joint Manipulations;Canalith Repostioning;Therapeutic activities;Vestibular    PT Next Visit Plan Work on strength, sitting/standing balance, functional activities such as rolling, bed mobility, transfers; caregiver training    PT Home Exercise Plan no updates this session    Consulted and Agree with Plan of Care Patient;Family member/caregiver    Family Member Consulted DTR           Patient will benefit from skilled therapeutic intervention in order to improve the following deficits and impairments:  Abnormal gait, Decreased activity tolerance, Decreased cognition, Decreased endurance, Decreased knowledge of use of DME, Decreased range of motion, Decreased skin integrity, Decreased strength, Impaired perceived functional ability, Impaired sensation, Impaired UE functional use, Improper body mechanics, Pain, Cardiopulmonary status limiting activity, Decreased balance, Decreased coordination, Decreased mobility, Difficulty walking, Impaired tone, Postural dysfunction, Dizziness  Visit Diagnosis: Muscle weakness (generalized)  Other lack of coordination  Difficulty in walking, not elsewhere classified  Repeated falls     Problem List Patient Active Problem List   Diagnosis Date Noted  . Sepsis ([place]) 10/18/2018    Ashaz Robling PT, DPT 11/27/2019, 2:32 PM  [place] MAIN [facility] SERVICES [address] [place], [place], [address] Phone: [phone]   Fax:   [phone]  Name: Kenneth Summers MRN: [id number] Date of Birth: May 29, 1945

## 2019-12-01 ENCOUNTER — Ambulatory Visit: Payer: Medicare Other | Admitting: Occupational Therapy

## 2019-12-01 ENCOUNTER — Other Ambulatory Visit: Payer: Self-pay

## 2019-12-01 ENCOUNTER — Encounter: Payer: Self-pay | Admitting: Physical Therapy

## 2019-12-01 ENCOUNTER — Encounter: Payer: Self-pay | Admitting: Occupational Therapy

## 2019-12-01 ENCOUNTER — Ambulatory Visit: Payer: Medicare Other | Admitting: Physical Therapy

## 2019-12-01 DIAGNOSIS — R262 Difficulty in walking, not elsewhere classified: Secondary | ICD-10-CM

## 2019-12-01 DIAGNOSIS — R278 Other lack of coordination: Secondary | ICD-10-CM

## 2019-12-01 DIAGNOSIS — R296 Repeated falls: Secondary | ICD-10-CM

## 2019-12-01 DIAGNOSIS — I69354 Hemiplegia and hemiparesis following cerebral infarction affecting left non-dominant side: Secondary | ICD-10-CM

## 2019-12-01 DIAGNOSIS — M6281 Muscle weakness (generalized): Secondary | ICD-10-CM

## 2019-12-01 NOTE — Therapy (Signed)
[place] MAIN [facility] SERVICES [address] [place], [place], [address] Phone: [phone]   Fax:  [phone]  Physical Therapy Treatment  Patient Details  Name: Kenneth Summers MRN: [id number] Date of Birth: August 04, 1945 No data recorded  Encounter Date: 12/01/2019   PT End of Session - 12/01/19 1300    Visit Number [id number]    Number of Visits 252    Date for PT Re-Evaluation 02/02/20    PT Start Time 1302    PT Stop Time 1345    PT Time Calculation (min) 43 min    Equipment Utilized During Treatment Gait belt    Activity Tolerance Patient tolerated treatment well;No increased pain    Behavior During Therapy WFL for tasks assessed/performed           Past Medical History:  Diagnosis Date  . Actinic keratosis   . Alcohol abuse   . APS (antiphospholipid syndrome) ([place])   . Basal cell carcinoma   . BPH (benign prostatic hyperplasia)   . CAD (coronary artery disease)   . Cellulitis   . Cervical spinal stenosis   . Chronic osteomyelitis ([place])   . CKD (chronic kidney disease)   . CKD (chronic kidney disease)   . Clostridium difficile diarrhea   . Depression   . Diplopia   . Fatigue   . GERD (gastroesophageal reflux disease)   . Hyperlipemia   . Hypertension   . Hypovitaminosis D   . IBS (irritable bowel syndrome)   . 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 Insomnia   . Left lumbosacral radiculopathy   . Moderate obstructive sleep apnea   . Osteomyelitis of foot ([place])   . PAD (peripheral artery disease) ([place])   . Pruritus   . RA (rheumatoid arthritis) ([place])   . Radiculopathy   . Restless legs syndrome   . RLS (restless legs syndrome)   . SLE (systemic lupus erythematosus related syndrome) ([place])   . 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 Stroke ([place])   . Toxic maculopathy of both eyes     Past Surgical History:  Procedure Laterality Date  . CARDIAC CATHETERIZATION    . CAROTID ENDARTERECTOMY    .  CERVICAL LAMINECTOMY    . CORONARY ANGIOPLASTY    . FRACTURE SURGERY    . fusion C5-6-7    . HERNIA REPAIR    . KNEE ARTHROSCOPY    . TONSILLECTOMY      There were no vitals filed for this visit.   Subjective Assessment - 12/01/19 1304    Subjective Patient denies any pain; Reports having a good birthday and was able to stand 1x over the weekend;    Pertinent History Patient is a 74 y.o. male who presents to outpatient physical therapy with a referral for medical diagnosis of CVA. This patient's chief complaints consist of left hemiplegia and overall deconditioning leading to the following functional deficits: dependent for ADLs, IADLs, unable to transfer to car, dependent transfers at home with hoyer lift, unable to walk or stand without significant assistance, difficulty with W/C navigation..    Diagnostic tests Brain MRI 11/01/2017: IMPRESSION: 1. Acute right ACA territory infarct as demonstrated on prior CT imaging. No intracranial hemorrhage or significant mass effect. 2. 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    Patient Stated Goals wants to be able to walk again    Currently in Pain? No/denies  Multiple Pain Sites No               TREATMENT: Prior to transfersand in between transfers: Patient sittingin wheelchair: -LLE only LAQx15with visual/tactile cues for increased AROM -arms in lap, resting UE, erect posture,deep breaths x2 min with verbal and tactile cues for neutral weight shift; -hip flexion march x15 reps each LE with visual cues for increased ROM;  Seated Passive LLE hamstring stretch 30 sec hold x2 reps for increased knee extension ROM;  Required min VCs and visual cues for better AROM to increase strengthening;  Ptrequires verbal and visual cues to improve ROM;He was able to exhibit improved LLE AROM with cues; Patient also requires cues for erect posture for better trunk control;Patient exhibits improved  neutral position this session with less lateral trunk lean;  Following exercise: Transferred sit<>Stand in parallel bars (seat adjusted to27inch from floor height) X5repswithmin A-CGA;herequiredmodVCs for forward weight shift for better trunk control and transfer ability; Patient able to exhibit better transfer ability with increased forward weight shiftwith less posterior loss of balance.   Patient required min Ato CGA for standing posture. Heexhibits increased left sided lean this session and increased posterior lean; Patient appears more fatigued today;  While standing: -mini squatx15reps with min A for safety; -side/side weight shift x10 reps; -heel raises x15 reps with cues for knee extension for increased calf strengthening;  -standing supported x20-30 sec with cues for neutral weight shift;  -RLE hip flexion marchx15reps, with min A and therapist blocking LLE knee;patient does exhibit heavy posterior lean with increased repetition;   Patient requiredmodA to scoot back in chair at end of session due to increased fatigue  Educated patient in HEP;  Patient able to exhibit better neutral weight shift and erect standing posture this session. He does fatigue quickly with prolonged standing. Patient does exhibit decreased cardiovascular endurance. This is result of limited activity at home and missed therapy visits from lack of transportation. However he does have a new home aide that should be able to assist with some exercise and will be able to help patient attend more therapy sessions. Hoping that improved mobility will result from increased activity;                       PT Education - 12/01/19 1300    Education Details transfers/positioning, LE strengthening, HEP    Person(s) Educated Patient    Methods Explanation;Verbal cues    Comprehension Verbalized understanding;Returned demonstration;Verbal cues required;Need further  instruction            PT Short Term Goals - 08/18/19 1305      PT SHORT TERM GOAL #1   Title Be independent with initial home exercise program for self-management of symptoms.    Baseline HEP to be given at second session; advice to practice unsupported sitting at home (06/19/2018); 07/08/18: Performing at home, 6/29: needs assistance but is doing exercise program regularly; 08/22/18: adherent;    Time 2    Period Weeks    Status Achieved    Target Date 06/26/18             PT Long Term Goals - 11/10/19 1323      PT LONG TERM GOAL #1   Title Patient will complete all bed mobility with min A to improve functional independence for getting in and out of bed and adjusting in bed.     Baseline max A - total A reported by family (06/19/2018); 07/08/18: still  requires maxA, dtr reports improvement in rolling since starting therapy; 6/23: min A to supervision in clinic; not doing at home right; 8/5: Pt's daughter states that his rolling has improved, but still has difficulty with all other bed mobility; 10/23/18: pts daughter states that she is doing about 35% of the work if he is in proper position, he does uses the bed rails to help roll;  using Harrel Lemon for coming to sit EOB; 10/12: pts daughter states that she is doing about 30% of the work if he is in proper position, he does uses the bed rails to help roll, but it is improving;  using Hoyer for coming to sit EOB, 10/29: min A for sit to sidelying, rolling supervision, able to transition sidelying to sit with HHA;    Time 12    Period Weeks    Status Achieved    Target Date 02/02/20      PT LONG TERM GOAL #2   Title Patient will complete sit <> stand transfer chair to chair with Min A and LRAD to improve functional independence for household and community mobility.     Baseline supervision for STS, still requires elevated surface height to 27"    Time 12    Period Weeks    Status Achieved    Target Date 02/02/20      PT LONG TERM GOAL #3    Title Patient will navigate power w/c with min A x 100 feet to improve mobility for household and short community distances.    Baseline required total A (06/12/2018); able to roll 20 feet with min A (06/20/2018); 07/08/18: Pt can go approximately 10' without assist per family;  10/23/2018: pt's daughter states that his power WC mobility has become 100% better with the new hand control, slowed speed, and with practice; 10/12: pt's daughter reports that he can perform household WC mobility and limited community ambulation with minA    Time 12    Period Weeks    Status Achieved    Target Date 02/02/20      PT LONG TERM GOAL #4   Title Patient will complete car transfers W/C to family SUV with min A using LRAD to improve his abilty to participate in community activities.     Baseline unable to complete car transfers at all (06/12/2018); unable to complete car transfers at all (06/19/2018).; 07/08/18: Unable to perform at this time; 09/11/18: unable to perform at this time; 10/23/2018: unable to complete car transfers at all; 10/12:  unable to complete car transfers at all, 10/29: unable; 01/13/19: unable; 2/11: min-mod A +2, 3/10: min A+1    Time 12    Period Weeks    Status Achieved    Target Date 02/02/20      PT LONG TERM GOAL #5   Title Patient will ambulate at least 10 feet with min A using LRAD to improve mobility for household and community distances.    Baseline unable to take steps (06/12/2018); able to shift R leg forward and back with max A and RW at edge of plinth (06/19/2018); 07/08/18: unable to ambulate at this time. 09/11/18: unable to perform at this time; 10/23/2018: unable to perform at this time; 10/12: unable to perform at this time, 10/29: unable at this time; 01/13/19: unable at this time, 2/11: min A to step with RLE, unable to step with LLE, 3/10: able to take 2 steps in parallel bars with min A for safety; 4/1: min A for walking 10  feet in parallel bars, 5/24: min A walking 10 feet in parallel bars;  6/21: no change 7/14: no change    Time 12    Period Weeks    Status Partially Met    Target Date 02/02/20      PT LONG TERM GOAL #6   Title Patient will be able to safety navigate up/down ramp with power chair, exhibiting good safety awareness, mod I to safely enter/exit his home.    Baseline 2/11: supervision with intermittent min A;4/1: supervision; 5/24: supervision    Time 12    Period Weeks    Status Partially Met    Target Date 02/02/20      PT LONG TERM GOAL #7   Title Patient will increase functional reach test to >15 inches in sitting to exhibit improved sitting balance and positioning and reduce fall risk;    Baseline 07/30/18: 12 inches; 09/11/18: 10-12 inches; 10/23/18: 12-13 inch; 10/12: 14.5", 10/29: 15.5 inch    Time 4    Period Weeks    Status Achieved    Target Date 02/02/20      PT LONG TERM GOAL #8   Title Patient will increase LLE quad strength to 3+/5 to improve functional strength for standing and mobility;    Baseline 10/28: 2+/5; 01/13/19: 2+/5, 2/11: 3-/5, 04/16/19: 3-/5, 05/08/19: 3-/5, 5/24: 3-/5, 6/21: 3-/5, 7/14: 3-/5    Time 12    Period Weeks    Status Partially Met    Target Date 02/02/20      PT LONG TERM GOAL  #9   TITLE Patient will report no vertigo with provoking motions or positions in order to be able to perform ADLs and mobility tasks without symptoms.    Baseline 06/30/19: reports no dizziness in last week; 6/21: one episode this weekend; 7/14: experiencing one a week;    Time 12    Period Weeks    Status Partially Met    Target Date 02/02/20                 Plan - 12/02/19 0858    Clinical Impression Statement Patient motivated and participated well within session. he does fatigue quickly with standing. However patient able to exhibit better standing position with less lean. Patient instructed to increase HEP adherence especially with forward unsupported sitting to challenge trunk control and improve cardiovascular endurance with less  support. He was able to exhibit better hip flexion and motor control with RLE marches exhibiting better weight shift to LLE. He was also able to tolerate increased repetition of LE exercise. Patient reports significant fatigue at end of session. he would benefit from additional skilled PT intervention to improve strength, balance and mobility;    Personal Factors and Comorbidities Age;Comorbidity 3+    Comorbidities Relevant past medical history and comorbidities include long term steroid use for lupus, CKD, chronic osteomyelitis, cervical spine stenosis, BPH, APS, alcohol abuse, peripheral artery disease, depression, GERD, obstructive sleep apnea, HTN, IBS, IgA deficiency, lumbar radiculopathy, RA, Stroke, toxic maculopathy in both eyes, systemic lupus erythematosus related syndrome, cardiac catheterization, carotid endarterectomy, cervical laminectomy, coronary angioplasty, Fusion C5-C7, knee arthroplasty, lumbar surgery.    Examination-Activity Limitations Bed Mobility;Bend;Sit;Toileting;Stand;Stairs;Lift;Transfers;Squat;Locomotion Level;Carry;Dressing;Hygiene/Grooming;Continence    Examination-Participation Restrictions Yard Work;Interpersonal Relationship;Community Activity    Stability/Clinical Decision Making Evolving/Moderate complexity    Rehab Potential Fair    PT Frequency 3x / week    PT Duration 12 weeks    PT Treatment/Interventions ADLs/Self Care Home Management;Electrical Stimulation;Moist Heat;Cryotherapy;Gait training;Stair training;Functional mobility  training;Therapeutic exercise;Balance training;Neuromuscular re-education;Cognitive remediation;Patient/family education;Orthotic Fit/Training;Wheelchair mobility training;Manual techniques;Passive range of motion;Energy conservation;Joint Manipulations;Canalith Repostioning;Therapeutic activities;Vestibular    PT Next Visit Plan Work on strength, sitting/standing balance, functional activities such as rolling, bed mobility, transfers;  caregiver training    PT Home Exercise Plan no updates this session    Consulted and Agree with Plan of Care Patient;Family member/caregiver    Family Member Consulted DTR           Patient will benefit from skilled therapeutic intervention in order to improve the following deficits and impairments:  Abnormal gait, Decreased activity tolerance, Decreased cognition, Decreased endurance, Decreased knowledge of use of DME, Decreased range of motion, Decreased skin integrity, Decreased strength, Impaired perceived functional ability, Impaired sensation, Impaired UE functional use, Improper body mechanics, Pain, Cardiopulmonary status limiting activity, Decreased balance, Decreased coordination, Decreased mobility, Difficulty walking, Impaired tone, Postural dysfunction, Dizziness  Visit Diagnosis: Muscle weakness (generalized)  Other lack of coordination  Difficulty in walking, not elsewhere classified  Repeated falls     Problem List Patient Active Problem List   Diagnosis Date Noted  . Sepsis ([place]) 10/18/2018    Mallika Sanmiguel PT, DPT 12/02/2019, 9:00 AM  [place] [facility] MAIN [facility] SERVICES [address] [place], [place], [address] Phone: [phone]   Fax:  [phone]  Name: Kenneth Summers MRN: [id number] Date of Birth: 1945-12-01

## 2019-12-01 NOTE — Therapy (Signed)
[place] MAIN [facility] SERVICES [address] [place], [place], [address] Phone: [phone]   Fax:  [phone]  Occupational Therapy Treatment  Patient Details  Name: Kenneth Summers MRN: [id number] Date of Birth: 1945/03/04 No data recorded  Encounter Date: 12/01/2019   OT End of Session - 12/01/19 1550    Visit Number [id number]    Number of Visits 157    Date for OT Re-Evaluation 12/15/19    Authorization Type Progress report period starting 07/21/2019    OT Start Time 1346    OT Stop Time 1429    OT Time Calculation (min) 43 min    Equipment Utilized During Treatment 3# dowel, 5# dowel, 3# handweight    Activity Tolerance Patient tolerated treatment well    Behavior During Therapy [facility] for tasks assessed/performed           Past Medical History:  Diagnosis Date  . Actinic keratosis   . Alcohol abuse   . APS (antiphospholipid syndrome) ([place])   . Basal cell carcinoma   . BPH (benign prostatic hyperplasia)   . CAD (coronary artery disease)   . Cellulitis   . Cervical spinal stenosis   . Chronic osteomyelitis ([place])   . CKD (chronic kidney disease)   . CKD (chronic kidney disease)   . Clostridium difficile diarrhea   . Depression   . Diplopia   . Fatigue   . GERD (gastroesophageal reflux disease)   . Hyperlipemia   . Hypertension   . Hypovitaminosis D   . IBS (irritable bowel syndrome)   . 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 Insomnia   . Left lumbosacral radiculopathy   . Moderate obstructive sleep apnea   . Osteomyelitis of foot ([place])   . PAD (peripheral artery disease) ([place])   . Pruritus   . RA (rheumatoid arthritis) ([place])   . Radiculopathy   . Restless legs syndrome   . RLS (restless legs syndrome)   . SLE (systemic lupus erythematosus related syndrome) ([place])   . 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 Stroke ([place])   . Toxic maculopathy of both eyes     Past Surgical History:  Procedure  Laterality Date  . CARDIAC CATHETERIZATION    . CAROTID ENDARTERECTOMY    . CERVICAL LAMINECTOMY    . CORONARY ANGIOPLASTY    . FRACTURE SURGERY    . fusion C5-6-7    . HERNIA REPAIR    . KNEE ARTHROSCOPY    . TONSILLECTOMY      There were no vitals filed for this visit.   Subjective Assessment - 12/01/19 1549    Subjective  Pt reports somewhat fatigued following PT session.    Patient is accompanied by: Family member    Pertinent History Pt. is a 74 y.o. 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 Pt. PMHx includes: Multiple Falls, Lupus, DJD, Renal Abscess, CVA. Pt. resides with his wife. Pt.'s wife and daughter assist with ADLs. Pt. has caregivers in  for 2 hours a day, 6 days a week. Pt. received Rehab services in acute care, at SNF for STR, and [place] services. Pt. is retired from [employer] for [employer] and [employer].    Patient Stated Goals To regain use of his left UE, and do more for himself.    Currently in Pain? No/denies           OT Treatment  There-Ex Pt instructed in BUE ex with 3# weighted dowel for 3 x 10 reps each for bilateral shoulder flexion, chest press, and vertical press with an additional 2x5 reps with emphasis on technique and not speed with 3# weighted dowel for vertical press and then another 2x5 reps with 5# weighted dowel. Pt required rest breaks and cues for technique for each. 5# weighted dowel for 4 x5 reps and 1x 10 reps bicep curls with 10 second holds on last rep. Pt instructed in use of 3# hand weight for forearm supination/pronation 2x10 reps, wrist extension/flexion 2x10 reps, and radial deviation 2 x10 reps.    Notable improvement in performance when provided with visual cue/marker for optimal positioning (such as a hand to indicate how high to bring the dowel).                OT Education - 12/01/19 1549    Education Details BUE Therapeutic ex    Person(s) Educated Patient    Methods  Explanation;Demonstration    Comprehension Returned demonstration;Tactile cues required;Verbalized understanding;Need further instruction               OT Long Term Goals - 10/23/19 1320      OT LONG TERM GOAL #1   Title Pt. will increase left shoulder flexion AROM by 10 degrees to access cabinet/shelf.    Baseline Pt. continues to progress with shoulder ROM. Marland KitchenShouler flexion 130, and continues to work on reaching with increasing emphasis on flexing trunk to improve functional reach.  07/21/19- shoulder flexion to 130 with effort    Time 12    Period Weeks    Status Partially Met    Target Date 12/15/19      OT LONG TERM GOAL #2   Title Pt. will donn a shirt with Supervision.    Baseline Pt. continues to require MinA donning a zip down jacket with consistent cues to initiate the correct direction of the jacket.  MinA for donning a T-shirt with assist to roll side to side to pull the shirt down over his back. 07/21/19 continues to require min assist at times for shirt/jacket    Time 12    Period Weeks    Status On-going    Target Date 12/15/19      OT LONG TERM GOAL #3   Title Pt. will require ModA to perform LE dressing    Baseline MaxA left, ModA with the right. Pt. has sores on his ankle, and feet. Pt.'s daughter is assisting.    Time 12    Period Weeks    Status Deferred    Target Date 12/15/19      OT LONG TERM GOAL #4   Title Pt. will improve left grip strength by 10# to be able to open a jar/container.    Baseline Pt. continues to present with limited left hand grip strength. Pt. continues to work towards opening containers, and bottles.    Time 12    Period Weeks    Status On-going    Target Date 02/14/19      OT LONG TERM GOAL #5   Title Pt. will improve left hand [facility] skills to be able to assist with buttoning/zipping.    Baseline Pt. is improving with manipulating the zipper on his jacket, however continues to require practice. Pt. continues to work on improving  bilateral [facility] skills for buttoning/zipping.  07/21/19 improving with buttons and zippers.    Time 12    Period Weeks  Status On-going    Target Date 12/15/19      OT LONG TERM GOAL #6   Title Pt. will demonstrate visual conmpensatory strategies for 100% of the time during ADLs    Baseline Pt.continues to require intermitten cues cues for left sides awareness.    Time 12    Period Weeks    Status Partially Met    Target Date 12/15/19      OT LONG TERM GOAL #7   Title Pt. will prepare a simple cold snack from w/c with supervision using cognitive compensatory strategies 100% of the time.    Baseline Pt. has difficulty with getting the w/c close enough to access the refrigerator    Time 12    Period Weeks    Status Deferred      OT LONG TERM GOAL #8   Title Pt. will accurately identify potential safety hazard using good safety awareness, and judgement 100% for ADLs, and IADLs.    Baseline Pt. requires cues    Time 12    Period Weeks    Status On-going    Target Date 12/15/19      OT LONG TERM GOAL  #9   TITLE Pt. will  navigate his w/c around obstacles with Supervision and 100% accuracy    Baseline Pt. continues to require cuing for obstacles on the left, however requires less cues overall. Pt. requires significant cues to engage his left UE during tasks. 07/21/2019 continues to improve with decreased assist at times    Time 12    Period Weeks    Status On-going    Target Date 12/15/19      OT LONG TERM GOAL  #10   TITLE Pt. will independently be able  to reach to place items into cabinetry, and closets.    Baseline Pt. BUE strength is improving. UE functional reaching conitnues to be limited.    Time 12    Period Weeks    Status On-going    Target Date 12/15/19      OT LONG TERM GOAL  #11   TITLE Pt. will increase BUE strength by 9mm grades in preparation for ADL transfers.    Baseline Pt. continues to work towards improving overall strength,    Time 12    Period Weeks     Status On-going    Target Date 12/15/19      OT LONG TERM GOAL  #12   TITLE Pt. will improve LUE functional reaching with the left engaging the trunk 100% of the time during ADL tasks with minimal cues.    Baseline Pt. conitnues to improve with LUE functional reaching. Pt. conitnues to require cues to engage his trunk for extended reaching.    Time 12    Period Weeks    Status On-going    Target Date 12/15/19                 Plan - 12/01/19 1551    Clinical Impression Statement Pt continues to tolerate increased stregnthening weights today. Denies pain/discomfort. Pt tolerated exercises well today, performing with improved technique when a visual and/or tactile cue was provided to support pt in reaching optimal positioning with weighted dowel or handweight. Pt expressed strong interest in continuing with this strategy to support improved performance. Pt continues to work on improving BUE strength and [place] to maximize LUE functioning during ADL, IADL, and transfers.    OT Occupational Profile and History Problem Focused Assessment - Including review of  records relating to presenting problem    Occupational performance deficits (Please refer to evaluation for details): ADL's    Body Structure / Function / Physical Skills UE functional use;Coordination;FMC;Dexterity;Strength;ROM    Cognitive Skills Attention;Memory;Emotional;Problem Solve;Safety Awareness    Rehab Potential Good    Clinical Decision Making Several treatment options, min-mod task modification necessary    Comorbidities Affecting Occupational Performance: Presence of comorbidities impacting occupational performance    Comorbidities impacting occupational performance description: Phyical, cognitive, visual,  medical comorbidities    Modification or Assistance to Complete Evaluation  No modification of tasks or assist necessary to complete eval    OT Frequency 2x / week    OT Duration 12 weeks    OT  Treatment/Interventions Self-care/ADL training;DME and/or AE instruction;Therapeutic exercise;Therapeutic activities;Moist Heat;Cognitive remediation/compensation;Neuromuscular education;Visual/perceptual remediation/compensation;Coping strategies training;Patient/family education;Passive range of motion;Psychosocial skills training;Energy conservation;Functional Mobility Training    Plan Continue with current OT plan of care.    Consulted and Agree with Plan of Care Patient;Family member/caregiver           Patient will benefit from skilled therapeutic intervention in order to improve the following deficits and impairments:   Body Structure / Function / Physical Skills: UE functional use, Coordination, FMC, Dexterity, Strength, ROM Cognitive Skills: Attention, Memory, Emotional, Problem Solve, Safety Awareness     Visit Diagnosis: Other lack of coordination  Hemiplegia and hemiparesis following cerebral infarction affecting left non-dominant side [facility])    Problem List Patient Active Problem List   Diagnosis Date Noted  . Sepsis ([place]) 10/18/2018    Jeni Salles, MPH, MS, OTR/L ascom [phone] 12/01/19, 4:07 PM   [place] MAIN [facility] SERVICES [address] [place], [place], [address] Phone: [phone]   Fax:  [phone]  Name: Kenneth Summers MRN: [id number] Date of Birth: Sep 22, 1945

## 2019-12-03 ENCOUNTER — Ambulatory Visit: Payer: Medicare Other | Admitting: Occupational Therapy

## 2019-12-03 ENCOUNTER — Ambulatory Visit: Payer: Medicare Other | Admitting: Physical Therapy

## 2019-12-04 ENCOUNTER — Other Ambulatory Visit: Payer: Self-pay

## 2019-12-04 ENCOUNTER — Ambulatory Visit: Payer: Medicare Other | Admitting: Occupational Therapy

## 2019-12-04 ENCOUNTER — Encounter: Payer: Self-pay | Admitting: Physical Therapy

## 2019-12-04 ENCOUNTER — Encounter: Payer: Self-pay | Admitting: Occupational Therapy

## 2019-12-04 ENCOUNTER — Ambulatory Visit: Payer: Medicare Other | Admitting: Physical Therapy

## 2019-12-04 DIAGNOSIS — R262 Difficulty in walking, not elsewhere classified: Secondary | ICD-10-CM

## 2019-12-04 DIAGNOSIS — R296 Repeated falls: Secondary | ICD-10-CM

## 2019-12-04 DIAGNOSIS — M6281 Muscle weakness (generalized): Secondary | ICD-10-CM

## 2019-12-04 DIAGNOSIS — R278 Other lack of coordination: Secondary | ICD-10-CM

## 2019-12-04 NOTE — Therapy (Signed)
[place] MAIN [facility] SERVICES [address] [place], [place], [address] Phone: [phone]   Fax:  [phone]  Occupational Therapy Treatment  Patient Details  Name: Kenneth Summers MRN: [id number] Date of Birth: 1945-04-07 No data recorded  Encounter Date: 12/04/2019   OT End of Session - 12/04/19 1319    Visit Number 149    Number of Visits 157    Date for OT Re-Evaluation 12/15/19    Authorization Type Progress report period starting 07/21/2019    OT Start Time 1300    OT Stop Time 1345    OT Time Calculation (min) 45 min    Activity Tolerance Patient tolerated treatment well    Behavior During Therapy [facility] for tasks assessed/performed           Past Medical History:  Diagnosis Date  . Actinic keratosis   . Alcohol abuse   . APS (antiphospholipid syndrome) ([place])   . Basal cell carcinoma   . BPH (benign prostatic hyperplasia)   . CAD (coronary artery disease)   . Cellulitis   . Cervical spinal stenosis   . Chronic osteomyelitis ([place])   . CKD (chronic kidney disease)   . CKD (chronic kidney disease)   . Clostridium difficile diarrhea   . Depression   . Diplopia   . Fatigue   . GERD (gastroesophageal reflux disease)   . Hyperlipemia   . Hypertension   . Hypovitaminosis D   . IBS (irritable bowel syndrome)   . 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 Insomnia   . Left lumbosacral radiculopathy   . Moderate obstructive sleep apnea   . Osteomyelitis of foot ([place])   . PAD (peripheral artery disease) ([place])   . Pruritus   . RA (rheumatoid arthritis) ([place])   . Radiculopathy   . Restless legs syndrome   . RLS (restless legs syndrome)   . SLE (systemic lupus erythematosus related syndrome) ([place])   . 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 Stroke ([place])   . Toxic maculopathy of both eyes     Past Surgical History:  Procedure Laterality Date  . CARDIAC CATHETERIZATION    . CAROTID ENDARTERECTOMY     . CERVICAL LAMINECTOMY    . CORONARY ANGIOPLASTY    . FRACTURE SURGERY    . fusion C5-6-7    . HERNIA REPAIR    . KNEE ARTHROSCOPY    . TONSILLECTOMY      There were no vitals filed for this visit.   Subjective Assessment - 12/04/19 1318    Patient is accompanied by: Family member    Pertinent History Pt. is a 74 y.o. 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 Pt. PMHx includes: Multiple Falls, Lupus, DJD, Renal Abscess, CVA. Pt. resides with his wife. Pt.'s wife and daughter assist with ADLs. Pt. has caregivers in  for 2 hours a day, 6 days a week. Pt. received Rehab services in acute care, at SNF for STR, and [place] services. Pt. is retired from [employer] for [employer] and [employer].    Currently in Pain? No/denies    Pain Score 0-No pain           OT TREATMENT   Therapeutic Exercise:  Pt. performed 2.5# dowel for UE strengthening secondary to weakness.Pt. tolerated increased dowel weight.Bilateral shoulder flexion, chest press,andcircular patterns were performedfor2 sets 20 reps each.4# dumbbell weightsforelbow flexion, extension, 3# forearm  supination/pronation,wrist flexion/extension, and radial deviation. Pt. requires rest breaks and verbal cues for proper technique.  Pt. Continues to tolerate increased hand weight today. No reports of pain, or discomfort.Pt.continues to present with weakness, as well asimpaired motor control, and motor planning.Pt. tolerated BUE exercises well, with no reportsofdiscomfort. Pt. required consistent verbal, visual, and tactile cues, however required less cuesto perform UE strengthening exercises, technique, and formas well as visual demonstration prior to the exercises.Pt. continues to work on improving BUE strength, and [facility] skills in order to improve LUE functioning during ADLs, IADLs, and tranfers.                        OT Education - 12/04/19 1319     Education Details BUE Therapeutic ex    Person(s) Educated Patient    Methods Explanation;Demonstration    Comprehension Returned demonstration;Tactile cues required;Verbalized understanding;Need further instruction               OT Long Term Goals - 10/23/19 1320      OT LONG TERM GOAL #1   Title Pt. will increase left shoulder flexion AROM by 10 degrees to access cabinet/shelf.    Baseline Pt. continues to progress with shoulder ROM. Marland KitchenShouler flexion 130, and continues to work on reaching with increasing emphasis on flexing trunk to improve functional reach.  07/21/19- shoulder flexion to 130 with effort    Time 12    Period Weeks    Status Partially Met    Target Date 12/15/19      OT LONG TERM GOAL #2   Title Pt. will donn a shirt with Supervision.    Baseline Pt. continues to require MinA donning a zip down jacket with consistent cues to initiate the correct direction of the jacket.  MinA for donning a T-shirt with assist to roll side to side to pull the shirt down over his back. 07/21/19 continues to require min assist at times for shirt/jacket    Time 12    Period Weeks    Status On-going    Target Date 12/15/19      OT LONG TERM GOAL #3   Title Pt. will require ModA to perform LE dressing    Baseline MaxA left, ModA with the right. Pt. has sores on his ankle, and feet. Pt.'s daughter is assisting.    Time 12    Period Weeks    Status Deferred    Target Date 12/15/19      OT LONG TERM GOAL #4   Title Pt. will improve left grip strength by 10# to be able to open a jar/container.    Baseline Pt. continues to present with limited left hand grip strength. Pt. continues to work towards opening containers, and bottles.    Time 12    Period Weeks    Status On-going    Target Date 02/14/19      OT LONG TERM GOAL #5   Title Pt. will improve left hand [facility] skills to be able to assist with buttoning/zipping.    Baseline Pt. is improving with manipulating the zipper on his  jacket, however continues to require practice. Pt. continues to work on improving bilateral [facility] skills for buttoning/zipping.  07/21/19 improving with buttons and zippers.    Time 12    Period Weeks    Status On-going    Target Date 12/15/19      OT LONG TERM GOAL #6   Title Pt. will demonstrate visual conmpensatory  strategies for 100% of the time during ADLs    Baseline Pt.continues to require intermitten cues cues for left sides awareness.    Time 12    Period Weeks    Status Partially Met    Target Date 12/15/19      OT LONG TERM GOAL #7   Title Pt. will prepare a simple cold snack from w/c with supervision using cognitive compensatory strategies 100% of the time.    Baseline Pt. has difficulty with getting the w/c close enough to access the refrigerator    Time 12    Period Weeks    Status Deferred      OT LONG TERM GOAL #8   Title Pt. will accurately identify potential safety hazard using good safety awareness, and judgement 100% for ADLs, and IADLs.    Baseline Pt. requires cues    Time 12    Period Weeks    Status On-going    Target Date 12/15/19      OT LONG TERM GOAL  #9   TITLE Pt. will  navigate his w/c around obstacles with Supervision and 100% accuracy    Baseline Pt. continues to require cuing for obstacles on the left, however requires less cues overall. Pt. requires significant cues to engage his left UE during tasks. 07/21/2019 continues to improve with decreased assist at times    Time 12    Period Weeks    Status On-going    Target Date 12/15/19      OT LONG TERM GOAL  #10   TITLE Pt. will independently be able  to reach to place items into cabinetry, and closets.    Baseline Pt. BUE strength is improving. UE functional reaching conitnues to be limited.    Time 12    Period Weeks    Status On-going    Target Date 12/15/19      OT LONG TERM GOAL  #11   TITLE Pt. will increase BUE strength by 42m grades in preparation for ADL transfers.    Baseline Pt.  continues to work towards improving overall strength,    Time 12    Period Weeks    Status On-going    Target Date 12/15/19      OT LONG TERM GOAL  #12   TITLE Pt. will improve LUE functional reaching with the left engaging the trunk 100% of the time during ADL tasks with minimal cues.    Baseline Pt. conitnues to improve with LUE functional reaching. Pt. conitnues to require cues to engage his trunk for extended reaching.    Time 12    Period Weeks    Status On-going    Target Date 12/15/19                 Plan - 12/04/19 1320    Clinical Impression Statement Pt. Continues to tolerate increased hand weight today. No reports of pain, or discomfort.Pt.continues to present with weakness, as well asimpaired motor control, and motor planning.Pt. tolerated BUE exercises well, with no reportsofdiscomfort. Pt. required consistent verbal, visual, and tactile cues, however required less cuesto perform UE strengthening exercises, technique, and formas well as visual demonstration prior to the exercises.Pt. continues to work on improving BUE strength, and [facility] in order to improve LUE functioning during ADLs, IADLs, and tranfers.   Occupational performance deficits (Please refer to evaluation for details): ADL's    Body Structure / Function / Physical Skills UE functional use;Coordination;FMC;Dexterity;Strength;ROM    Cognitive Skills Attention;Memory;Emotional;Problem  Solve;Safety Awareness    Rehab Potential Good    Clinical Decision Making Several treatment options, min-mod task modification necessary    Comorbidities Affecting Occupational Performance: Presence of comorbidities impacting occupational performance    Comorbidities impacting occupational performance description: Phyical, cognitive, visual,  medical comorbidities    Modification or Assistance to Complete Evaluation  No modification of tasks or assist necessary to complete eval    OT Frequency 2x / week    OT  Duration 12 weeks    OT Treatment/Interventions Self-care/ADL training;DME and/or AE instruction;Therapeutic exercise;Therapeutic activities;Moist Heat;Cognitive remediation/compensation;Neuromuscular education;Visual/perceptual remediation/compensation;Coping strategies training;Patient/family education;Passive range of motion;Psychosocial skills training;Energy conservation;Functional Mobility Training    Consulted and Agree with Plan of Care Patient;Family member/caregiver           Patient will benefit from skilled therapeutic intervention in order to improve the following deficits and impairments:   Body Structure / Function / Physical Skills: UE functional use, Coordination, FMC, Dexterity, Strength, ROM Cognitive Skills: Attention, Memory, Emotional, Problem Solve, Safety Awareness     Visit Diagnosis: Muscle weakness (generalized)    Problem List Patient Active Problem List   Diagnosis Date Noted  . Sepsis ([place]) 10/18/2018    Harrel Carina, MS, OTR/L 12/04/2019, 1:26 PM  [place] MAIN [facility] SERVICES [address] [place], [place], [address] Phone: [phone]   Fax:  [phone]  Name: Kenneth Summers MRN: [id number] Date of Birth: 1946/01/15

## 2019-12-04 NOTE — Therapy (Signed)
[place] [facility]IN [facility] SERVICES [address] [place], [place], [address] Phone: [phone]   Fax:  [phone]  Physical Therapy Treatment  Patient Details  Name: Kenneth Summers MRN: [id number] Date of Birth: 18-Aug-1945 No data recorded  Encounter Date: 12/04/2019   PT End of Session - 12/04/19 1348    Visit Number [id number]    Number of Visits 252    Date for PT Re-Evaluation 02/02/20    PT Start Time 1346    PT Stop Time 1430    PT Time Calculation (min) 44 min    Equipment Utilized During Treatment Gait belt    Activity Tolerance Patient tolerated treatment well;No increased pain    Behavior During Therapy WFL for tasks assessed/performed           Past Medical History:  Diagnosis Date  . Actinic keratosis   . Alcohol abuse   . APS (antiphospholipid syndrome) ([place])   . Basal cell carcinoma   . BPH (benign prostatic hyperplasia)   . CAD (coronary artery disease)   . Cellulitis   . Cervical spinal stenosis   . Chronic osteomyelitis ([place])   . CKD (chronic kidney disease)   . CKD (chronic kidney disease)   . Clostridium difficile diarrhea   . Depression   . Diplopia   . Fatigue   . GERD (gastroesophageal reflux disease)   . Hyperlipemia   . Hypertension   . Hypovitaminosis D   . IBS (irritable bowel syndrome)   . 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 Insomnia   . Left lumbosacral radiculopathy   . Moderate obstructive sleep apnea   . Osteomyelitis of foot ([place])   . PAD (peripheral artery disease) ([place])   . Pruritus   . RA (rheumatoid arthritis) ([place])   . Radiculopathy   . Restless legs syndrome   . RLS (restless legs syndrome)   . SLE (systemic lupus erythematosus related syndrome) ([place])   . 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 Stroke ([place])   . Toxic maculopathy of both eyes     Past Surgical History:  Procedure Laterality Date  . CARDIAC CATHETERIZATION    . CAROTID ENDARTERECTOMY    .  CERVICAL LAMINECTOMY    . CORONARY ANGIOPLASTY    . FRACTURE SURGERY    . fusion C5-6-7    . HERNIA REPAIR    . KNEE ARTHROSCOPY    . TONSILLECTOMY      There were no vitals filed for this visit.   Subjective Assessment - 12/04/19 1348    Subjective Patient reports doing well. Reports increased fatigue after last session;    Pertinent History Patient is a 74 y.o. male who presents to outpatient physical therapy with a referral for medical diagnosis of CVA. This patient's chief complaints consist of left hemiplegia and overall deconditioning leading to the following functional deficits: dependent for ADLs, IADLs, unable to transfer to car, dependent transfers at home with hoyer lift, unable to walk or stand without significant assistance, difficulty with W/C navigation..    Diagnostic tests Brain MRI 11/01/2017: IMPRESSION: 1. Acute right ACA territory infarct as demonstrated on prior CT imaging. No intracranial hemorrhage or significant mass effect. 2. 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    Patient Stated Goals wants to be able to walk again    Currently in Pain? No/denies    Multiple Pain Sites No  TREATMENT:  Transferred standpivot wheelchair to [employer], with [place] A +2, patient exhibits increased posterior lean this session requiring mod VCs for forward weight shift for better transfer ability;  Nustep: BUE/BLE level 3 x10 min with cues for steps per minute >60 (averaged 50) with total distance,0.61miles SPO2 98%, HR 108 following exercise;    Transferred squat pivot nustep to wheelchair maxA +2with cues for hand placement  Ptrequires verbal and visual cues to improve ROM;He was able to exhibit improved LLE AROM with cues; Patient also requires cues for erect posture for better trunk control;  Following exercise: Attempted sit<>Stand transfers in parallel bars, Patient chair adjusted to 28 inch, he was able  to transfer sit<>Stand from elevated chair with min A +1 x2 reps;Patient required minA for neutral weight shift with min VCs and tactile cues for hip extension and erect standing posture. In standing: Mini squat x15 reps with cues for increased knee flexion/extension for better strengthening;  Standing RLE hip flexion march x15 reps with therapist providing support to LLE and cues for weight shift;    Patient requiredmodA to scoot back in chair at end of session due to increased fatigue    Response to treatment:Patient tolerated session well.He does fatigue quickly.He was really tired after nustep but was able to exhibit better UE/LE movement. Patient was able to exhibit better knee extension on LLE in stance after Nustep activity with better quad activation;                         PT Education - 12/04/19 1348    Education Details transfers/positioning, LE strengthening, HEP    Person(s) Educated Patient    Methods Explanation;Verbal cues    Comprehension Verbalized understanding;Returned demonstration;Verbal cues required;Need further instruction            PT Short Term Goals - 08/18/19 1305      PT SHORT TERM GOAL #1   Title Be independent with initial home exercise program for self-management of symptoms.    Baseline HEP to be given at second session; advice to practice unsupported sitting at home (06/19/2018); 07/08/18: Performing at home, 6/29: needs assistance but is doing exercise program regularly; 08/22/18: adherent;    Time 2    Period Weeks    Status Achieved    Target Date 06/26/18             PT Long Term Goals - 11/10/19 1323      PT LONG TERM GOAL #1   Title Patient will complete all bed mobility with min A to improve functional independence for getting in and out of bed and adjusting in bed.     Baseline max A - total A reported by family (06/19/2018); 07/08/18: still requires maxA, dtr reports improvement in rolling since starting  therapy; 6/23: min A to supervision in clinic; not doing at home right; 8/5: Pt's daughter states that his rolling has improved, but still has difficulty with all other bed mobility; 10/23/18: pts daughter states that she is doing about 35% of the work if he is in proper position, he does uses the bed rails to help roll;  using Harrel Lemon for coming to sit EOB; 10/12: pts daughter states that she is doing about 30% of the work if he is in proper position, he does uses the bed rails to help roll, but it is improving;  using Hoyer for coming to sit EOB, 10/29: min A for sit to sidelying, rolling supervision,  able to transition sidelying to sit with HHA;    Time 12    Period Weeks    Status Achieved    Target Date 02/02/20      PT LONG TERM GOAL #2   Title Patient will complete sit <> stand transfer chair to chair with Min A and LRAD to improve functional independence for household and community mobility.     Baseline supervision for STS, still requires elevated surface height to 27"    Time 12    Period Weeks    Status Achieved    Target Date 02/02/20      PT LONG TERM GOAL #3   Title Patient will navigate power w/c with min A x 100 feet to improve mobility for household and short community distances.    Baseline required total A (06/12/2018); able to roll 20 feet with min A (06/20/2018); 07/08/18: Pt can go approximately 10' without assist per family;  10/23/2018: pt's daughter states that his power WC mobility has become 100% better with the new hand control, slowed speed, and with practice; 10/12: pt's daughter reports that he can perform household WC mobility and limited community ambulation with minA    Time 12    Period Weeks    Status Achieved    Target Date 02/02/20      PT LONG TERM GOAL #4   Title Patient will complete car transfers W/C to family SUV with min A using LRAD to improve his abilty to participate in community activities.     Baseline unable to complete car transfers at all (06/12/2018);  unable to complete car transfers at all (06/19/2018).; 07/08/18: Unable to perform at this time; 09/11/18: unable to perform at this time; 10/23/2018: unable to complete car transfers at all; 10/12:  unable to complete car transfers at all, 10/29: unable; 01/13/19: unable; 2/11: min-mod A +2, 3/10: min A+1    Time 12    Period Weeks    Status Achieved    Target Date 02/02/20      PT LONG TERM GOAL #5   Title Patient will ambulate at least 10 feet with min A using LRAD to improve mobility for household and community distances.    Baseline unable to take steps (06/12/2018); able to shift R leg forward and back with max A and RW at edge of plinth (06/19/2018); 07/08/18: unable to ambulate at this time. 09/11/18: unable to perform at this time; 10/23/2018: unable to perform at this time; 10/12: unable to perform at this time, 10/29: unable at this time; 01/13/19: unable at this time, 2/11: min A to step with RLE, unable to step with LLE, 3/10: able to take 2 steps in parallel bars with min A for safety; 4/1: min A for walking 10 feet in parallel bars, 5/24: min A walking 10 feet in parallel bars; 6/21: no change 7/14: no change    Time 12    Period Weeks    Status Partially Met    Target Date 02/02/20      PT LONG TERM GOAL #6   Title Patient will be able to safety navigate up/down ramp with power chair, exhibiting good safety awareness, mod I to safely enter/exit his home.    Baseline 2/11: supervision with intermittent min A;4/1: supervision; 5/24: supervision    Time 12    Period Weeks    Status Partially Met    Target Date 02/02/20      PT LONG TERM GOAL #7   Title  Patient will increase functional reach test to >15 inches in sitting to exhibit improved sitting balance and positioning and reduce fall risk;    Baseline 07/30/18: 12 inches; 09/11/18: 10-12 inches; 10/23/18: 12-13 inch; 10/12: 14.5", 10/29: 15.5 inch    Time 4    Period Weeks    Status Achieved    Target Date 02/02/20      PT LONG TERM GOAL #8    Title Patient will increase LLE quad strength to 3+/5 to improve functional strength for standing and mobility;    Baseline 10/28: 2+/5; 01/13/19: 2+/5, 2/11: 3-/5, 04/16/19: 3-/5, 05/08/19: 3-/5, 5/24: 3-/5, 6/21: 3-/5, 7/14: 3-/5    Time 12    Period Weeks    Status Partially Met    Target Date 02/02/20      PT LONG TERM GOAL  #9   TITLE Patient will report no vertigo with provoking motions or positions in order to be able to perform ADLs and mobility tasks without symptoms.    Baseline 06/30/19: reports no dizziness in last week; 6/21: one episode this weekend; 7/14: experiencing one a week;    Time 12    Period Weeks    Status Partially Met    Target Date 02/02/20                 Plan - 12/04/19 1433    Clinical Impression Statement Patient motivated and participated well. He was able to exhibit better speed and mobility on Nustep this session with less left lateral lean and improved trunk control. Patient does fatigue but was able to exhibit better cardiovascular response. Patient instructed in advanced standing activities. He was able to exhibit better LLE knee extension in stance with less flexed posture this session. He would benefit from additional skilled PT Intervention to improve strength, balance and mobility;    Personal Factors and Comorbidities Age;Comorbidity 3+    Comorbidities Relevant past medical history and comorbidities include long term steroid use for lupus, CKD, chronic osteomyelitis, cervical spine stenosis, BPH, APS, alcohol abuse, peripheral artery disease, depression, GERD, obstructive sleep apnea, HTN, IBS, IgA deficiency, lumbar radiculopathy, RA, Stroke, toxic maculopathy in both eyes, systemic lupus erythematosus related syndrome, cardiac catheterization, carotid endarterectomy, cervical laminectomy, coronary angioplasty, Fusion C5-C7, knee arthroplasty, lumbar surgery.    Examination-Activity Limitations Bed  Mobility;Bend;Sit;Toileting;Stand;Stairs;Lift;Transfers;Squat;Locomotion Level;Carry;Dressing;Hygiene/Grooming;Continence    Examination-Participation Restrictions Yard Work;Interpersonal Relationship;Community Activity    Stability/Clinical Decision Making Evolving/Moderate complexity    Rehab Potential Fair    PT Frequency 3x / week    PT Duration 12 weeks    PT Treatment/Interventions ADLs/Self Care Home Management;Electrical Stimulation;Moist Heat;Cryotherapy;Gait training;Stair training;Functional mobility training;Therapeutic exercise;Balance training;Neuromuscular re-education;Cognitive remediation;Patient/family education;Orthotic Fit/Training;Wheelchair mobility training;Manual techniques;Passive range of motion;Energy conservation;Joint Manipulations;Canalith Repostioning;Therapeutic activities;Vestibular    PT Next Visit Plan Work on strength, sitting/standing balance, functional activities such as rolling, bed mobility, transfers; caregiver training    PT Home Exercise Plan no updates this session    Consulted and Agree with Plan of Care Patient;Family member/caregiver    Family Member Consulted DTR           Patient will benefit from skilled therapeutic intervention in order to improve the following deficits and impairments:  Abnormal gait, Decreased activity tolerance, Decreased cognition, Decreased endurance, Decreased knowledge of use of DME, Decreased range of motion, Decreased skin integrity, Decreased strength, Impaired perceived functional ability, Impaired sensation, Impaired UE functional use, Improper body mechanics, Pain, Cardiopulmonary status limiting activity, Decreased balance, Decreased coordination, Decreased mobility, Difficulty walking, Impaired tone, Postural  dysfunction, Dizziness  Visit Diagnosis: Muscle weakness (generalized)  Other lack of coordination  Difficulty in walking, not elsewhere classified  Repeated falls     Problem List Patient Active  Problem List   Diagnosis Date Noted  . Sepsis ([place]) 10/18/2018    Avon Mergenthaler PT, DPT 12/04/2019, 2:34 PM  [place] MAIN [facility] SERVICES [address] [place], [place], [address] Phone: [phone]   Fax:  [phone]  Name: Kenneth Summers MRN: [id number] Date of Birth: 01-14-1946

## 2019-12-08 ENCOUNTER — Ambulatory Visit: Payer: Medicare Other | Attending: Family Medicine | Admitting: Physical Therapy

## 2019-12-08 ENCOUNTER — Encounter: Payer: Self-pay | Admitting: Occupational Therapy

## 2019-12-08 ENCOUNTER — Ambulatory Visit: Payer: Medicare Other | Admitting: Occupational Therapy

## 2019-12-08 ENCOUNTER — Other Ambulatory Visit: Payer: Self-pay

## 2019-12-08 ENCOUNTER — Encounter: Payer: Self-pay | Admitting: Physical Therapy

## 2019-12-08 DIAGNOSIS — R262 Difficulty in walking, not elsewhere classified: Secondary | ICD-10-CM | POA: Diagnosis present

## 2019-12-08 DIAGNOSIS — R278 Other lack of coordination: Secondary | ICD-10-CM

## 2019-12-08 DIAGNOSIS — R296 Repeated falls: Secondary | ICD-10-CM | POA: Diagnosis present

## 2019-12-08 DIAGNOSIS — M6281 Muscle weakness (generalized): Secondary | ICD-10-CM

## 2019-12-08 NOTE — Therapy (Signed)
[place] MAIN [facility] SERVICES [address] [place], [place], [address] Phone: [phone]   Fax:  [phone]  Physical Therapy Treatment  Patient Details  Name: Kenneth Summers MRN: [id number] Date of Birth: 1945-05-23 No data recorded  Encounter Date: 12/08/2019   PT End of Session - 12/08/19 1329    Visit Number [id number]    Number of Visits 252    Date for PT Re-Evaluation 02/02/20    PT Start Time 1302    PT Stop Time 1345    PT Time Calculation (min) 43 min    Equipment Utilized During Treatment Gait belt    Activity Tolerance Patient tolerated treatment well;No increased pain    Behavior During Therapy WFL for tasks assessed/performed           Past Medical History:  Diagnosis Date  . Actinic keratosis   . Alcohol abuse   . APS (antiphospholipid syndrome) ([place])   . Basal cell carcinoma   . BPH (benign prostatic hyperplasia)   . CAD (coronary artery disease)   . Cellulitis   . Cervical spinal stenosis   . Chronic osteomyelitis ([place])   . CKD (chronic kidney disease)   . CKD (chronic kidney disease)   . Clostridium difficile diarrhea   . Depression   . Diplopia   . Fatigue   . GERD (gastroesophageal reflux disease)   . Hyperlipemia   . Hypertension   . Hypovitaminosis D   . IBS (irritable bowel syndrome)   . 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 Insomnia   . Left lumbosacral radiculopathy   . Moderate obstructive sleep apnea   . Osteomyelitis of foot ([place])   . PAD (peripheral artery disease) ([place])   . Pruritus   . RA (rheumatoid arthritis) ([place])   . Radiculopathy   . Restless legs syndrome   . RLS (restless legs syndrome)   . SLE (systemic lupus erythematosus related syndrome) ([place])   . 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 Stroke ([place])   . Toxic maculopathy of both eyes     Past Surgical History:  Procedure Laterality Date  . CARDIAC CATHETERIZATION    . CAROTID ENDARTERECTOMY    .  CERVICAL LAMINECTOMY    . CORONARY ANGIOPLASTY    . FRACTURE SURGERY    . fusion C5-6-7    . HERNIA REPAIR    . KNEE ARTHROSCOPY    . TONSILLECTOMY      There were no vitals filed for this visit.   Subjective Assessment - 12/08/19 1445    Subjective Patient reports doing well. Denies any pain. Reports adherence with HEP    Pertinent History Patient is a 74 y.o. male who presents to outpatient physical therapy with a referral for medical diagnosis of CVA. This patient's chief complaints consist of left hemiplegia and overall deconditioning leading to the following functional deficits: dependent for ADLs, IADLs, unable to transfer to car, dependent transfers at home with hoyer lift, unable to walk or stand without significant assistance, difficulty with W/C navigation..    Diagnostic tests Brain MRI 11/01/2017: IMPRESSION: 1. Acute right ACA territory infarct as demonstrated on prior CT imaging. No intracranial hemorrhage or significant mass effect. 2. 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    Patient Stated Goals wants to be able to walk again    Currently in Pain? No/denies    Multiple Pain Sites No  TREATMENT: Prior to transfersand in between transfers: Patient sittingin wheelchair: -LLE only LAQx15with visual/tactile cues for increased AROM -arms in lap, resting UE, erect posture,deep breaths x2 min with verbal and tactile cues for neutral weight shift; -hip flexion march x15 reps each LE with visual cues for increased ROM;  Seated Passive LLE hamstring stretch 30 sec hold x2 reps for increased knee extension ROM;  Required min VCs and visual cues for better AROM to increase strengthening;  Ptrequires verbal and visual cues to improve ROM;He was able to exhibit improved LLE AROM with cues; Patient also requires cues for erect posture for better trunk control;Patient exhibits improved neutral position this session  with less lateral trunk lean;  Following exercise: Transferred sit<>Stand in parallel bars (seat adjusted to27inch from floor height) X5repswithmin A;herequiredmodVCs for forward weight shift for better trunk control and transfer ability; Patient exhibits increased posterior lean this session requiring increased cues for forward weight shift for better neutral weight shift;   Patient required min Ato CGA for standing posture. Heexhibits increased left sided lean this session and increased posterior lean; Patient appears more fatigued today;  While standing: -mini squat2x15reps with min A for safety; -standing supported x20-30 sec with cues for neutral weight shift; -Standing unsupported x10 sec with min A for safety;  -RLE hip flexion marchx15reps, with min A and therapist blocking LLE knee;patient does exhibit heavy posterior lean with increased repetition;   Patient requiredmodA to scoot back in chair at end of session due to increased fatigue  Educated patient in HEP  Patient exhibits increased posterior lean this session requiring increased time for forward weight shift in preparation for transfer and cues for increased hip extension in stance. Patient was able to exhibit better quad activation with standing squats. He does report increased fatigue at end of session;                         PT Education - 12/08/19 1329    Education Details transfers/positioning, LE strengthening, HEP    Person(s) Educated Patient    Methods Explanation;Verbal cues    Comprehension Verbalized understanding;Returned demonstration;Verbal cues required;Need further instruction            PT Short Term Goals - 08/18/19 1305      PT SHORT TERM GOAL #1   Title Be independent with initial home exercise program for self-management of symptoms.    Baseline HEP to be given at second session; advice to practice unsupported sitting at home (06/19/2018);  07/08/18: Performing at home, 6/29: needs assistance but is doing exercise program regularly; 08/22/18: adherent;    Time 2    Period Weeks    Status Achieved    Target Date 06/26/18             PT Long Term Goals - 11/10/19 1323      PT LONG TERM GOAL #1   Title Patient will complete all bed mobility with min A to improve functional independence for getting in and out of bed and adjusting in bed.     Baseline max A - total A reported by family (06/19/2018); 07/08/18: still requires maxA, dtr reports improvement in rolling since starting therapy; 6/23: min A to supervision in clinic; not doing at home right; 8/5: Pt's daughter states that his rolling has improved, but still has difficulty with all other bed mobility; 10/23/18: pts daughter states that she is doing about 35% of the work if he is in proper  position, he does uses the bed rails to help roll;  using Harrel Lemon for coming to sit EOB; 10/12: pts daughter states that she is doing about 30% of the work if he is in proper position, he does uses the bed rails to help roll, but it is improving;  using Hoyer for coming to sit EOB, 10/29: min A for sit to sidelying, rolling supervision, able to transition sidelying to sit with HHA;    Time 12    Period Weeks    Status Achieved    Target Date 02/02/20      PT LONG TERM GOAL #2   Title Patient will complete sit <> stand transfer chair to chair with Min A and LRAD to improve functional independence for household and community mobility.     Baseline supervision for STS, still requires elevated surface height to 27"    Time 12    Period Weeks    Status Achieved    Target Date 02/02/20      PT LONG TERM GOAL #3   Title Patient will navigate power w/c with min A x 100 feet to improve mobility for household and short community distances.    Baseline required total A (06/12/2018); able to roll 20 feet with min A (06/20/2018); 07/08/18: Pt can go approximately 10' without assist per family;  10/23/2018: pt's  daughter states that his power WC mobility has become 100% better with the new hand control, slowed speed, and with practice; 10/12: pt's daughter reports that he can perform household WC mobility and limited community ambulation with minA    Time 12    Period Weeks    Status Achieved    Target Date 02/02/20      PT LONG TERM GOAL #4   Title Patient will complete car transfers W/C to family SUV with min A using LRAD to improve his abilty to participate in community activities.     Baseline unable to complete car transfers at all (06/12/2018); unable to complete car transfers at all (06/19/2018).; 07/08/18: Unable to perform at this time; 09/11/18: unable to perform at this time; 10/23/2018: unable to complete car transfers at all; 10/12:  unable to complete car transfers at all, 10/29: unable; 01/13/19: unable; 2/11: min-mod A +2, 3/10: min A+1    Time 12    Period Weeks    Status Achieved    Target Date 02/02/20      PT LONG TERM GOAL #5   Title Patient will ambulate at least 10 feet with min A using LRAD to improve mobility for household and community distances.    Baseline unable to take steps (06/12/2018); able to shift R leg forward and back with max A and RW at edge of plinth (06/19/2018); 07/08/18: unable to ambulate at this time. 09/11/18: unable to perform at this time; 10/23/2018: unable to perform at this time; 10/12: unable to perform at this time, 10/29: unable at this time; 01/13/19: unable at this time, 2/11: min A to step with RLE, unable to step with LLE, 3/10: able to take 2 steps in parallel bars with min A for safety; 4/1: min A for walking 10 feet in parallel bars, 5/24: min A walking 10 feet in parallel bars; 6/21: no change 7/14: no change    Time 12    Period Weeks    Status Partially Met    Target Date 02/02/20      PT LONG TERM GOAL #6   Title Patient will be able  to safety navigate up/down ramp with power chair, exhibiting good safety awareness, mod I to safely enter/exit his home.     Baseline 2/11: supervision with intermittent min A;4/1: supervision; 5/24: supervision    Time 12    Period Weeks    Status Partially Met    Target Date 02/02/20      PT LONG TERM GOAL #7   Title Patient will increase functional reach test to >15 inches in sitting to exhibit improved sitting balance and positioning and reduce fall risk;    Baseline 07/30/18: 12 inches; 09/11/18: 10-12 inches; 10/23/18: 12-13 inch; 10/12: 14.5", 10/29: 15.5 inch    Time 4    Period Weeks    Status Achieved    Target Date 02/02/20      PT LONG TERM GOAL #8   Title Patient will increase LLE quad strength to 3+/5 to improve functional strength for standing and mobility;    Baseline 10/28: 2+/5; 01/13/19: 2+/5, 2/11: 3-/5, 04/16/19: 3-/5, 05/08/19: 3-/5, 5/24: 3-/5, 6/21: 3-/5, 7/14: 3-/5    Time 12    Period Weeks    Status Partially Met    Target Date 02/02/20      PT LONG TERM GOAL  #9   TITLE Patient will report no vertigo with provoking motions or positions in order to be able to perform ADLs and mobility tasks without symptoms.    Baseline 06/30/19: reports no dizziness in last week; 6/21: one episode this weekend; 7/14: experiencing one a week;    Time 12    Period Weeks    Status Partially Met    Target Date 02/02/20                 Plan - 12/08/19 1446    Clinical Impression Statement Patient motivated and participated fair within session. he does exhibit increased posterior lean this session requiring increased cues verbal and tactile for forward weight shift. he does fatigue quickly with standing requiring min A in stand for better weight shift and LE weight bearing. Patient denies any increase in pain but does report increased fatigue. Reinforced HEP. He would benefit from additional skilled PT Intervention to improve strength, balance and mobility;    Personal Factors and Comorbidities Age;Comorbidity 3+    Comorbidities Relevant past medical history and comorbidities include long term  steroid use for lupus, CKD, chronic osteomyelitis, cervical spine stenosis, BPH, APS, alcohol abuse, peripheral artery disease, depression, GERD, obstructive sleep apnea, HTN, IBS, IgA deficiency, lumbar radiculopathy, RA, Stroke, toxic maculopathy in both eyes, systemic lupus erythematosus related syndrome, cardiac catheterization, carotid endarterectomy, cervical laminectomy, coronary angioplasty, Fusion C5-C7, knee arthroplasty, lumbar surgery.    Examination-Activity Limitations Bed Mobility;Bend;Sit;Toileting;Stand;Stairs;Lift;Transfers;Squat;Locomotion Level;Carry;Dressing;Hygiene/Grooming;Continence    Examination-Participation Restrictions Yard Work;Interpersonal Relationship;Community Activity    Stability/Clinical Decision Making Evolving/Moderate complexity    Rehab Potential Fair    PT Frequency 3x / week    PT Duration 12 weeks    PT Treatment/Interventions ADLs/Self Care Home Management;Electrical Stimulation;Moist Heat;Cryotherapy;Gait training;Stair training;Functional mobility training;Therapeutic exercise;Balance training;Neuromuscular re-education;Cognitive remediation;Patient/family education;Orthotic Fit/Training;Wheelchair mobility training;Manual techniques;Passive range of motion;Energy conservation;Joint Manipulations;Canalith Repostioning;Therapeutic activities;Vestibular    PT Next Visit Plan Work on strength, sitting/standing balance, functional activities such as rolling, bed mobility, transfers; caregiver training    PT Home Exercise Plan no updates this session    Consulted and Agree with Plan of Care Patient;Family member/caregiver    Family Member Consulted DTR           Patient will benefit from skilled therapeutic intervention  in order to improve the following deficits and impairments:  Abnormal gait, Decreased activity tolerance, Decreased cognition, Decreased endurance, Decreased knowledge of use of DME, Decreased range of motion, Decreased skin integrity,  Decreased strength, Impaired perceived functional ability, Impaired sensation, Impaired UE functional use, Improper body mechanics, Pain, Cardiopulmonary status limiting activity, Decreased balance, Decreased coordination, Decreased mobility, Difficulty walking, Impaired tone, Postural dysfunction, Dizziness  Visit Diagnosis: Muscle weakness (generalized)  Other lack of coordination  Difficulty in walking, not elsewhere classified     Problem List Patient Active Problem List   Diagnosis Date Noted  . Sepsis ([place]) 10/18/2018    Evonda Enge PT, DPT 12/08/2019, 2:52 PM  [place] MAIN [facility] SERVICES [address] [place], [place], [address] Phone: [phone]   Fax:  [phone]  Name: JAHEEM HEDGEPATH MRN: [id number] Date of Birth: February 25, 1945

## 2019-12-08 NOTE — Therapy (Signed)
[place] MAIN [facility] SERVICES [address] [place], [place], [address] Phone: [phone]   Fax:  [phone]  Occupational Therapy Recertification/Progress Note  Dates of reporting period  07/21/2019  to   11/01/202  Patient Details  Name: Kenneth Summers MRN: [id number] Date of Birth: Feb 24, 1945 No data recorded  Encounter Date: 12/08/2019   OT End of Session - 12/08/19 1435    Visit Number 150    Number of Visits 157    Date for OT Re-Evaluation 03/01/20    Authorization Type Progress report period starting 07/21/2019    OT Start Time 1345    OT Stop Time 1430    OT Time Calculation (min) 45 min    Activity Tolerance Patient tolerated treatment well    Behavior During Therapy [facility] for tasks assessed/performed           Past Medical History:  Diagnosis Date  . Actinic keratosis   . Alcohol abuse   . APS (antiphospholipid syndrome) ([place])   . Basal cell carcinoma   . BPH (benign prostatic hyperplasia)   . CAD (coronary artery disease)   . Cellulitis   . Cervical spinal stenosis   . Chronic osteomyelitis ([place])   . CKD (chronic kidney disease)   . CKD (chronic kidney disease)   . Clostridium difficile diarrhea   . Depression   . Diplopia   . Fatigue   . GERD (gastroesophageal reflux disease)   . Hyperlipemia   . Hypertension   . Hypovitaminosis D   . IBS (irritable bowel syndrome)   . 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 Insomnia   . Left lumbosacral radiculopathy   . Moderate obstructive sleep apnea   . Osteomyelitis of foot ([place])   . PAD (peripheral artery disease) ([place])   . Pruritus   . RA (rheumatoid arthritis) ([place])   . Radiculopathy   . Restless legs syndrome   . RLS (restless legs syndrome)   . SLE (systemic lupus erythematosus related syndrome) ([place])   . 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 Stroke ([place])   . Toxic maculopathy of both eyes     Past Surgical History:  Procedure  Laterality Date  . CARDIAC CATHETERIZATION    . CAROTID ENDARTERECTOMY    . CERVICAL LAMINECTOMY    . CORONARY ANGIOPLASTY    . FRACTURE SURGERY    . fusion C5-6-7    . HERNIA REPAIR    . KNEE ARTHROSCOPY    . TONSILLECTOMY      There were no vitals filed for this visit.   Subjective Assessment - 12/08/19 1435    Subjective  Pt. reports being fatigued    Patient is accompanied by: Family member    Pertinent History Pt. is a 74 y.o. 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 Pt. PMHx includes: Multiple Falls, Lupus, DJD, Renal Abscess, CVA. Pt. resides with his wife. Pt.'s wife and daughter assist with ADLs. Pt. has caregivers in  for 2 hours a day, 6 days a week. Pt. received Rehab services in acute care, at SNF for STR, and [place] services. Pt. is retired from [employer] for [employer] and [employer].    Currently in Pain? No/denies           OT TREATMENT   Therapeutic Exercise:  Pt. performed 3# dowel for UE strengthening secondary to weakness.Pt. tolerated increased dowel weight.Bilateral shoulder flexion,  chest press,andcircular patterns were performedfor1 sets 10 reps each, followed by 2.5# for 1 set 10 reps. 3# dumbbell weightsforelbow flexion, extension,forearm supination/pronation,wrist flexion/extension, and radial deviation. Pt. requires rest breaks and verbal cues for proper technique.  Pt. continues to make progress overall. Pt. Is progressing with BUE strength, and is consistently able to to tolerate increased resistive weight.Pt.continues to present with weakness, as well asimpaired motor control, and motor planning.Pt. tolerated BUE exercises well, with no reportsofdiscomfort. Pt. required consistent verbal, visual, and tactile cues, however required less cuesto perform UE strengthening exercises, technique, and formas well as visual demonstration prior to the exercises.Pt. continues to work on improving BUE  strength, and [facility] skills in order to improve LUE functioning during ADLs, IADLs, and transfers. Pt. with POC and goals.                             OT Education - 12/08/19 1435    Education Details BUE Therapeutic ex    Person(s) Educated Patient    Comprehension Returned demonstration;Tactile cues required;Verbalized understanding;Need further instruction               OT Long Term Goals - 12/08/19 1437      OT LONG TERM GOAL #1   Title Pt. will increase left shoulder flexion AROM by 10 degrees to access cabinet/shelf.    Baseline Pt. continues to progress with left shoulder ROM .Shouler flexion 130, and continues to work on reaching with increasing emphasis on flexing trunk to improve functional reach.  07/21/19- shoulder flexion to 130 with effort    Time 12    Period Weeks    Status Not Met    Target Date 03/01/20      OT LONG TERM GOAL #2   Title Pt. will donn a shirt with Supervision.    Baseline Pt. continues to require MinA donning a zip down jacket with consistent cues to initiate the correct direction of the jacket.  MinA for donning a T-shirt with assist to roll side to side to pull the shirt down over his back. 07/21/19 continues to require min assist at times for shirt/jacket    Time 12    Period Weeks    Status On-going    Target Date 03/01/20      OT LONG TERM GOAL #3   Title Pt. will require ModA to perform LE dressing    Baseline MaxA left, ModA with the right. Pt. has sores on his ankle, and feet. Pt.'s daughter is assisting.    Time 12    Period Weeks    Status Deferred    Target Date 03/02/19      OT LONG TERM GOAL #4   Title Pt. will improve left grip strength by 10# to be able to open a jar/container.    Baseline Pt. continues to present with limited left hand grip strength. Pt. continues to work towards opening containers, and bottles.    Time 12    Period Weeks    Status On-going    Target Date 03/02/19      OT LONG  TERM GOAL #5   Title Pt. will improve left hand [facility] skills to be able to assist with buttoning/zipping.    Baseline Pt. has improved overall donning a jacket, and  manipulating the zipper on his jacket, however continues to require practice. Pt. continues to work on improving bilateral [facility] skills for buttoning/zipping.  07/21/19 improving with buttons  and zippers.    Time 12    Period Weeks    Status On-going    Target Date 03/01/20      OT LONG TERM GOAL #6   Title Pt. will demonstrate visual conmpensatory strategies for 100% of the time during ADLs    Baseline Pt.continues to require intermittent cues cues for left sides awareness.    Time 12    Period Weeks    Status Partially Met    Target Date 03/01/20      OT LONG TERM GOAL #7   Title Pt. will prepare a simple cold snack from w/c with supervision using cognitive compensatory strategies 100% of the time.    Baseline Pt. has difficulty with getting the w/c close enough to access the refrigerator    Time 12    Period Weeks    Status Deferred      OT LONG TERM GOAL #8   Title Pt. will accurately identify potential safety hazard using good safety awareness, and judgement 100% for ADLs, and IADLs.    Baseline Pt. continues to require cues    Time 12    Period Weeks    Status On-going    Target Date 03/01/20      OT LONG TERM GOAL  #9   TITLE Pt. will  navigate his w/c around obstacles with Supervision and 100% accuracy    Baseline Pt. continues to require cuing for obstacles on the left, however requires less cues overall. Pt. requires significant cues to engage his left UE during tasks. 07/21/2019 continues to improve with decreased assist at times    Time 12    Period Weeks    Status On-going    Target Date 03/01/20      OT LONG TERM GOAL  #10   TITLE Pt. will independently be able  to reach to place items into cabinetry, and closets.    Baseline Pt. BUE strength is improving. UE functional reaching conitnues to be limited.     Time 12    Period Weeks    Status On-going    Target Date 03/01/20      OT LONG TERM GOAL  #11   TITLE Pt. will increase BUE strength by 66m grades in preparation for ADL transfers.    Baseline Pt. in improveing BUE strength, and  is able to tolertae increased resistive weights. Pt. continues to work towards improving overall strength.    Time 12    Period Weeks    Status On-going    Target Date 03/01/20      OT LONG TERM GOAL  #12   TITLE Pt. will improve LUE functional reaching with the left engaging the trunk 100% of the time during ADL tasks with minimal cues.    Baseline Pt. conitnues to improve with LUE functional reaching. Fewer cues are required to engage his trunk for extended reaching.    Time 12    Period Weeks    Status On-going    Target Date 03/01/20                 Plan - 12/08/19 1436    Clinical Impression Statement Pt. continues to make progress overall. Pt. Is progressing with BUE strength, and is consistently able to to tolerate increased resistive weight.Pt.continues to present with weakness, as well asimpaired motor control, and motor planning.Pt. tolerated BUE exercises well, with no reportsofdiscomfort. Pt. required consistent verbal, visual, and tactile cues, however required less cuesto perform UE strengthening  exercises, technique, and formas well as visual demonstration prior to the exercises.Pt. continues to work on improving BUE strength, and [facility] skills in order to improve LUE functioning during ADLs, IADLs, and transfers. Pt. with POC and goals.    OT Occupational Profile and History Problem Focused Assessment - Including review of records relating to presenting problem    Occupational performance deficits (Please refer to evaluation for details): ADL's    Body Structure / Function / Physical Skills UE functional use;Coordination;FMC;Dexterity;Strength;ROM    Cognitive Skills Attention;Memory;Emotional;Problem Solve;Safety Awareness     Rehab Potential Good    Clinical Decision Making Several treatment options, min-mod task modification necessary    Comorbidities Affecting Occupational Performance: Presence of comorbidities impacting occupational performance    Comorbidities impacting occupational performance description: Phyical, cognitive, visual,  medical comorbidities    Modification or Assistance to Complete Evaluation  No modification of tasks or assist necessary to complete eval    OT Frequency 2x / week    OT Duration 12 weeks    OT Treatment/Interventions Self-care/ADL training;DME and/or AE instruction;Therapeutic exercise;Therapeutic activities;Moist Heat;Cognitive remediation/compensation;Neuromuscular education;Visual/perceptual remediation/compensation;Coping strategies training;Patient/family education;Passive range of motion;Psychosocial skills training;Energy conservation;Functional Mobility Training    Consulted and Agree with Plan of Care Patient;Family member/caregiver    Family Member Consulted daughter [name]           Patient will benefit from skilled therapeutic intervention in order to improve the following deficits and impairments:   Body Structure / Function / Physical Skills: UE functional use, Coordination, FMC, Dexterity, Strength, ROM Cognitive Skills: Attention, Memory, Emotional, Problem Solve, Safety Awareness     Visit Diagnosis: Muscle weakness (generalized)  Other lack of coordination    Problem List Patient Active Problem List   Diagnosis Date Noted  . Sepsis ([place]) 10/18/2018    Harrel Carina, MS, OTR/L 12/08/2019, 4:00 PM  [place] MAIN [facility] SERVICES [address] [place], [place], [address] Phone: [phone]   Fax:  [phone]  Name: Kenneth Summers MRN: [id number] Date of Birth: Feb 17, 1945

## 2019-12-10 ENCOUNTER — Ambulatory Visit: Payer: Medicare Other | Admitting: Physical Therapy

## 2019-12-11 ENCOUNTER — Ambulatory Visit: Payer: Medicare Other | Admitting: Occupational Therapy

## 2019-12-11 ENCOUNTER — Ambulatory Visit: Payer: Medicare Other | Admitting: Physical Therapy

## 2019-12-15 ENCOUNTER — Other Ambulatory Visit: Payer: Self-pay

## 2019-12-15 ENCOUNTER — Ambulatory Visit: Payer: Medicare Other | Admitting: Physical Therapy

## 2019-12-15 ENCOUNTER — Ambulatory Visit: Payer: Medicare Other | Admitting: Occupational Therapy

## 2019-12-15 ENCOUNTER — Encounter: Payer: Self-pay | Admitting: Occupational Therapy

## 2019-12-15 ENCOUNTER — Encounter: Payer: Self-pay | Admitting: Physical Therapy

## 2019-12-15 DIAGNOSIS — M6281 Muscle weakness (generalized): Secondary | ICD-10-CM | POA: Diagnosis not present

## 2019-12-15 DIAGNOSIS — R296 Repeated falls: Secondary | ICD-10-CM

## 2019-12-15 DIAGNOSIS — R278 Other lack of coordination: Secondary | ICD-10-CM

## 2019-12-15 DIAGNOSIS — R262 Difficulty in walking, not elsewhere classified: Secondary | ICD-10-CM

## 2019-12-15 NOTE — Therapy (Signed)
[place] MAIN [facility] SERVICES [address] [place], [place], [address] Phone: [phone]   Fax:  [phone]  Occupational Therapy Treatment  Patient Details  Name: Kenneth Summers MRN: [id number] Date of Birth: 03/20/1945 No data recorded  Encounter Date: 12/15/2019   OT End of Session - 12/15/19 1523    Visit Number 151    Number of Visits 157    Date for OT Re-Evaluation 03/01/20    Authorization Type Progress report period starting 07/21/2019    OT Start Time 1350    OT Stop Time 1430    OT Time Calculation (min) 40 min    Activity Tolerance Patient tolerated treatment well    Behavior During Therapy [facility] for tasks assessed/performed           Past Medical History:  Diagnosis Date  . Actinic keratosis   . Alcohol abuse   . APS (antiphospholipid syndrome) ([place])   . Basal cell carcinoma   . BPH (benign prostatic hyperplasia)   . CAD (coronary artery disease)   . Cellulitis   . Cervical spinal stenosis   . Chronic osteomyelitis ([place])   . CKD (chronic kidney disease)   . CKD (chronic kidney disease)   . Clostridium difficile diarrhea   . Depression   . Diplopia   . Fatigue   . GERD (gastroesophageal reflux disease)   . Hyperlipemia   . Hypertension   . Hypovitaminosis D   . IBS (irritable bowel syndrome)   . 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 Insomnia   . Left lumbosacral radiculopathy   . Moderate obstructive sleep apnea   . Osteomyelitis of foot ([place])   . PAD (peripheral artery disease) ([place])   . Pruritus   . RA (rheumatoid arthritis) ([place])   . Radiculopathy   . Restless legs syndrome   . RLS (restless legs syndrome)   . SLE (systemic lupus erythematosus related syndrome) ([place])   . 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 Stroke ([place])   . Toxic maculopathy of both eyes     Past Surgical History:  Procedure Laterality Date  . CARDIAC CATHETERIZATION    . CAROTID ENDARTERECTOMY    .  CERVICAL LAMINECTOMY    . CORONARY ANGIOPLASTY    . FRACTURE SURGERY    . fusion C5-6-7    . HERNIA REPAIR    . KNEE ARTHROSCOPY    . TONSILLECTOMY      There were no vitals filed for this visit.   Subjective Assessment - 12/15/19 1524    Subjective  Pt. reports that he won't be here on Wednesday or Thursday due to transportation, and the [employer].    Patient is accompanied by: Family member    Pertinent History Pt. is a 74 y.o. 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 Pt. PMHx includes: Multiple Falls, Lupus, DJD, Renal Abscess, CVA. Pt. resides with his wife. Pt.'s wife and daughter assist with ADLs. Pt. has caregivers in  for 2 hours a day, 6 days a week. Pt. received Rehab services in acute care, at SNF for STR, and [place] services. Pt. is retired from [employer] for [employer] and [employer].    Patient Stated Goals To regain use of his left UE, and do more for himself.    Currently in Pain? No/denies          OT TREATMENT  Therapeutic Exercise:  Pt. performed 2# dowel for UE strengthening secondary to weakness.Pt. tolerated increased dowel weight.Bilateral shoulder flexion, chest press,andcircular patterns were performedfor2 sets 20 reps each.Pt. Performed red theraband resistive ex for bilateral shoulder flexion, diagonal abduction, horizontal abduction, elbow flexion, and extension.  Pt. continuesto make progress with BUE strength, and tolerance for there. Ex. Pt.continues to present with weakness, as well asimpaired motor control, and motor planning.Pt. tolerated BUE exercises well, with no reportsofdiscomfort. Pt. required consistent verbal, visual, and tactile cues. Pt. Continues to require less cuesto perform UE strengthening exercises, technique, and formas well as visual demonstration prior to the exercises.Pt. continues to work on improving BUE strength, and [facility] skills in order to improve LUE  functioning during ADLs, IADLs, and tranfers.                       OT Education - 12/15/19 1523    Education Details BUE Therapeutic ex    Person(s) Educated Patient    Methods Explanation;Demonstration    Comprehension Returned demonstration;Tactile cues required;Verbalized understanding;Need further instruction               OT Long Term Goals - 12/08/19 1437      OT LONG TERM GOAL #1   Title Pt. will increase left shoulder flexion AROM by 10 degrees to access cabinet/shelf.    Baseline Pt. continues to progress with left shoulder ROM .Shouler flexion 130, and continues to work on reaching with increasing emphasis on flexing trunk to improve functional reach.  07/21/19- shoulder flexion to 130 with effort    Time 12    Period Weeks    Status Not Met    Target Date 03/01/20      OT LONG TERM GOAL #2   Title Pt. will donn a shirt with Supervision.    Baseline Pt. continues to require MinA donning a zip down jacket with consistent cues to initiate the correct direction of the jacket.  MinA for donning a T-shirt with assist to roll side to side to pull the shirt down over his back. 07/21/19 continues to require min assist at times for shirt/jacket    Time 12    Period Weeks    Status On-going    Target Date 03/01/20      OT LONG TERM GOAL #3   Title Pt. will require ModA to perform LE dressing    Baseline MaxA left, ModA with the right. Pt. has sores on his ankle, and feet. Pt.'s daughter is assisting.    Time 12    Period Weeks    Status Deferred    Target Date 03/02/19      OT LONG TERM GOAL #4   Title Pt. will improve left grip strength by 10# to be able to open a jar/container.    Baseline Pt. continues to present with limited left hand grip strength. Pt. continues to work towards opening containers, and bottles.    Time 12    Period Weeks    Status On-going    Target Date 03/02/19      OT LONG TERM GOAL #5   Title Pt. will improve left hand [facility]  skills to be able to assist with buttoning/zipping.    Baseline Pt. has improved overall donning a jacket, and  manipulating the zipper on his jacket, however continues to require practice. Pt. continues to work on improving bilateral [facility] skills for buttoning/zipping.  07/21/19 improving with buttons and zippers.  Time 12    Period Weeks    Status On-going    Target Date 03/01/20      OT LONG TERM GOAL #6   Title Pt. will demonstrate visual conmpensatory strategies for 100% of the time during ADLs    Baseline Pt.continues to require intermittent cues cues for left sides awareness.    Time 12    Period Weeks    Status Partially Met    Target Date 03/01/20      OT LONG TERM GOAL #7   Title Pt. will prepare a simple cold snack from w/c with supervision using cognitive compensatory strategies 100% of the time.    Baseline Pt. has difficulty with getting the w/c close enough to access the refrigerator    Time 12    Period Weeks    Status Deferred      OT LONG TERM GOAL #8   Title Pt. will accurately identify potential safety hazard using good safety awareness, and judgement 100% for ADLs, and IADLs.    Baseline Pt. continues to require cues    Time 12    Period Weeks    Status On-going    Target Date 03/01/20      OT LONG TERM GOAL  #9   TITLE Pt. will  navigate his w/c around obstacles with Supervision and 100% accuracy    Baseline Pt. continues to require cuing for obstacles on the left, however requires less cues overall. Pt. requires significant cues to engage his left UE during tasks. 07/21/2019 continues to improve with decreased assist at times    Time 12    Period Weeks    Status On-going    Target Date 03/01/20      OT LONG TERM GOAL  #10   TITLE Pt. will independently be able  to reach to place items into cabinetry, and closets.    Baseline Pt. BUE strength is improving. UE functional reaching conitnues to be limited.    Time 12    Period Weeks    Status On-going     Target Date 03/01/20      OT LONG TERM GOAL  #11   TITLE Pt. will increase BUE strength by 73mm grades in preparation for ADL transfers.    Baseline Pt. in improveing BUE strength, and  is able to tolertae increased resistive weights. Pt. continues to work towards improving overall strength.    Time 12    Period Weeks    Status On-going    Target Date 03/01/20      OT LONG TERM GOAL  #12   TITLE Pt. will improve LUE functional reaching with the left engaging the trunk 100% of the time during ADL tasks with minimal cues.    Baseline Pt. conitnues to improve with LUE functional reaching. Fewer cues are required to engage his trunk for extended reaching.    Time 12    Period Weeks    Status On-going    Target Date 03/01/20                 Plan - 12/15/19 1527    Clinical Impression Statement Pt. continuesto make progress with BUE strength, and tolerance for there. Ex. Pt.continues to present with weakness, as well asimpaired motor control, and motor planning.Pt. tolerated BUE exercises well, with no reportsofdiscomfort. Pt. required consistent verbal, visual, and tactile cues. Pt. Continues to require less cuesto perform UE strengthening exercises, technique, and formas well as visual demonstration prior to the exercises.Pt.  continues to work on improving BUE strength, and [facility] skills in order to improve LUE functioning during ADLs, IADLs, and tranfers.   OT Occupational Profile and History Problem Focused Assessment - Including review of records relating to presenting problem    Occupational performance deficits (Please refer to evaluation for details): ADL's    Body Structure / Function / Physical Skills UE functional use;Coordination;FMC;Dexterity;Strength;ROM    Cognitive Skills Attention;Memory;Emotional;Problem Solve;Safety Awareness    Rehab Potential Good    Clinical Decision Making Several treatment options, min-mod task modification necessary    Comorbidities  Affecting Occupational Performance: Presence of comorbidities impacting occupational performance    Comorbidities impacting occupational performance description: Phyical, cognitive, visual,  medical comorbidities    Modification or Assistance to Complete Evaluation  No modification of tasks or assist necessary to complete eval    OT Frequency 2x / week    OT Duration 12 weeks    OT Treatment/Interventions Self-care/ADL training;DME and/or AE instruction;Therapeutic exercise;Therapeutic activities;Moist Heat;Cognitive remediation/compensation;Neuromuscular education;Visual/perceptual remediation/compensation;Coping strategies training;Patient/family education;Passive range of motion;Psychosocial skills training;Energy conservation;Functional Mobility Training    Consulted and Agree with Plan of Care Patient;Family member/caregiver    Family Member Consulted daughter [name]           Patient will benefit from skilled therapeutic intervention in order to improve the following deficits and impairments:   Body Structure / Function / Physical Skills: UE functional use, Coordination, FMC, Dexterity, Strength, ROM Cognitive Skills: Attention, Memory, Emotional, Problem Solve, Safety Awareness     Visit Diagnosis: Muscle weakness (generalized)  Other lack of coordination    Problem List Patient Active Problem List   Diagnosis Date Noted  . Sepsis ([place]) 10/18/2018    Harrel Carina, MS, OTR/L 12/15/2019, 3:48 PM  [place] MAIN [facility] SERVICES [address] [place], [place], [address] Phone: [phone]   Fax:  [phone]  Name: Kenneth Summers MRN: [id number] Date of Birth: [birth year]

## 2019-12-15 NOTE — Patient Instructions (Signed)
Exercises to do every day:  1- Leaning forward in the chair (either elbows on your knees or reaching forward for the sink) 10x  2- Sitting forward in the chair with the backrest reclined back (supporting yourself) for 5 min  3- Leg kicks with each leg x10-15 repetitions  4- Marches x10-15 reps  5- arm exercises with band/hand weights - (what Margaretha Sheffield has been having you do)

## 2019-12-15 NOTE — Therapy (Signed)
[name] [facility] SERVICES [address] [place], [place], [address] Phone: [phone]   Fax:  [phone]  Physical Therapy Treatment  Patient Details  Name: Kenneth Summers MRN: [id number] Date of Birth: 1945-04-29 No data recorded  Encounter Date: 12/15/2019   PT End of Session - 12/15/19 1305    Visit Number [id number]    Number of Visits 252    Date for PT Re-Evaluation 02/02/20    PT Start Time 1302    PT Stop Time 1345    PT Time Calculation (min) 43 min    Equipment Utilized During Treatment Gait belt    Activity Tolerance Patient tolerated treatment well;No increased pain    Behavior During Therapy WFL for tasks assessed/performed           Past Medical History:  Diagnosis Date  . Actinic keratosis   . Alcohol abuse   . APS (antiphospholipid syndrome) ([place])   . Basal cell carcinoma   . BPH (benign prostatic hyperplasia)   . CAD (coronary artery disease)   . Cellulitis   . Cervical spinal stenosis   . Chronic osteomyelitis ([place])   . CKD (chronic kidney disease)   . CKD (chronic kidney disease)   . Clostridium difficile diarrhea   . Depression   . Diplopia   . Fatigue   . GERD (gastroesophageal reflux disease)   . Hyperlipemia   . Hypertension   . Hypovitaminosis D   . IBS (irritable bowel syndrome)   . 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 Insomnia   . Left lumbosacral radiculopathy   . Moderate obstructive sleep apnea   . Osteomyelitis of foot ([place])   . PAD (peripheral artery disease) ([place])   . Pruritus   . RA (rheumatoid arthritis) ([place])   . Radiculopathy   . Restless legs syndrome   . RLS (restless legs syndrome)   . SLE (systemic lupus erythematosus related syndrome) ([place])   . 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 Stroke ([place])   . Toxic maculopathy of both eyes     Past Surgical History:  Procedure Laterality Date  . CARDIAC CATHETERIZATION    . CAROTID ENDARTERECTOMY    .  CERVICAL LAMINECTOMY    . CORONARY ANGIOPLASTY    . FRACTURE SURGERY    . fusion C5-6-7    . HERNIA REPAIR    . KNEE ARTHROSCOPY    . TONSILLECTOMY      There were no vitals filed for this visit.   Subjective Assessment - 12/15/19 1304    Subjective Patient reports doing well; Denies any pain; He reports having trouble with his home aide, she has tested positive for COVID and therefore won't be able to help; He does report doing LAQ at home    Pertinent History Patient is a 74 y.o. male who presents to outpatient physical therapy with a referral for medical diagnosis of CVA. This patient's chief complaints consist of left hemiplegia and overall deconditioning leading to the following functional deficits: dependent for ADLs, IADLs, unable to transfer to car, dependent transfers at home with hoyer lift, unable to walk or stand without significant assistance, difficulty with W/C navigation..    Diagnostic tests Brain MRI 11/01/2017: IMPRESSION: 1. Acute right ACA territory infarct as demonstrated on prior CT imaging. No intracranial hemorrhage or significant mass effect. 2. 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    Patient Stated Goals  wants to be able to walk again    Currently in Pain? No/denies    Multiple Pain Sites No                TREATMENT: Prior to transfersand in between transfers: Patient sittingin wheelchair: -LLE only LAQx15with visual/tactile cues for increased AROM -arms in lap, resting UE, erect posture,deep breaths x2 min with verbal and tactile cues for neutral weight shift; -hip flexion march x15 reps each LE with visual cues for increased ROM;Patient instructed to sit forward and not hold onto arm rests to reduce compensation of back with cues to reduce posterior loss of balance with hip flexion;  Seated Passive LLE hamstring stretch 30 sec hold x2 reps for increased knee extension ROM; Required min VCs and visual cues  for better AROM to increase strengthening;  Ptrequires verbal and visual cues to improve ROM;He was able to exhibit improved LLE AROM with cues; Patient also requires cues for erect posture for better trunk control;Patient exhibits improved neutral position this session with less lateral trunk lean;  Following exercise: Transferred sit<>Stand in parallel bars (seat adjusted to27inch from floor height) X2repswithmin A;herequiredmodVCs for forward weight shift for better trunk control and transfer ability; Patient exhibits increased posterior lean this session requiring increased cues for forward weight shift for better neutral weight shift;   Patient required min Ato CGA for standing posture. Heexhibits increased left sided lean this session and increased posterior lean; Patient appears more fatigued today;  While standing: -mini squatx15reps with min A for safety; -standing supported x20-30 sec with cues for neutral weight shift; -RLE hip flexion marchx15reps, with min A and therapist blocking LLE knee;patient does exhibit heavy posterior lean with increased repetition; He required cues to slow down RLE movement and increase forward weight shift;   Patient requiredmodA to scoot back in chair at end of session due to increased fatigue  Educated patient in HEP with instruction to work on increasing trunk control and challenging cardiovascular endurance with increased UE exercise for better exercise tolerance;   Patient exhibits increased posterior lean this session requiring increased time for forward weight shift in preparation for transfer and cues for increased hip extension in stance. Patient was able to exhibit better quad activation with standing squats. He does report increased fatigue at end of session;                       PT Education - 12/15/19 1305    Education Details transfers/positioning, LE strengthening, HEP    Person(s)  Educated Patient    Methods Explanation;Verbal cues    Comprehension Verbalized understanding;Returned demonstration;Verbal cues required;Need further instruction            PT Short Term Goals - 08/18/19 1305      PT SHORT TERM GOAL #1   Title Be independent with initial home exercise program for self-management of symptoms.    Baseline HEP to be given at second session; advice to practice unsupported sitting at home (06/19/2018); 07/08/18: Performing at home, 6/29: needs assistance but is doing exercise program regularly; 08/22/18: adherent;    Time 2    Period Weeks    Status Achieved    Target Date 06/26/18             PT Long Term Goals - 11/10/19 1323      PT LONG TERM GOAL #1   Title Patient will complete all bed mobility with min A to improve functional independence for getting  in and out of bed and adjusting in bed.     Baseline max A - total A reported by family (06/19/2018); 07/08/18: still requires maxA, dtr reports improvement in rolling since starting therapy; 6/23: min A to supervision in clinic; not doing at home right; 8/5: Pt's daughter states that his rolling has improved, but still has difficulty with all other bed mobility; 10/23/18: pts daughter states that she is doing about 35% of the work if he is in proper position, he does uses the bed rails to help roll;  using Hoyer for coming to sit EOB; 10/12: pts daughter states that she is doing about 30% of the work if he is in proper position, he does uses the bed rails to help roll, but it is improving;  using Hoyer for coming to sit EOB, 10/29: min A for sit to sidelying, rolling supervision, able to transition sidelying to sit with HHA;    Time 12    Period Weeks    Status Achieved    Target Date 02/02/20      PT LONG TERM GOAL #2   Title Patient will complete sit <> stand transfer chair to chair with Min A and LRAD to improve functional independence for household and community mobility.     Baseline supervision for  STS, still requires elevated surface height to 27"    Time 12    Period Weeks    Status Achieved    Target Date 02/02/20      PT LONG TERM GOAL #3   Title Patient will navigate power w/c with min A x 100 feet to improve mobility for household and short community distances.    Baseline required total A (06/12/2018); able to roll 20 feet with min A (06/20/2018); 07/08/18: Pt can go approximately 10' without assist per family;  10/23/2018: pt's daughter states that his power WC mobility has become 100% better with the new hand control, slowed speed, and with practice; 10/12: pt's daughter reports that he can perform household WC mobility and limited community ambulation with minA    Time 12    Period Weeks    Status Achieved    Target Date 02/02/20      PT LONG TERM GOAL #4   Title Patient will complete car transfers W/C to family SUV with min A using LRAD to improve his abilty to participate in community activities.     Baseline unable to complete car transfers at all (06/12/2018); unable to complete car transfers at all (06/19/2018).; 07/08/18: Unable to perform at this time; 09/11/18: unable to perform at this time; 10/23/2018: unable to complete car transfers at all; 10/12:  unable to complete car transfers at all, 10/29: unable; 01/13/19: unable; 2/11: min-mod A +2, 3/10: min A+1    Time 12    Period Weeks    Status Achieved    Target Date 02/02/20      PT LONG TERM GOAL #5   Title Patient will ambulate at least 10 feet with min A using LRAD to improve mobility for household and community distances.    Baseline unable to take steps (06/12/2018); able to shift R leg forward and back with max A and RW at edge of plinth (06/19/2018); 07/08/18: unable to ambulate at this time. 09/11/18: unable to perform at this time; 10/23/2018: unable to perform at this time; 10/12: unable to perform at this time, 10/29: unable at this time; 01/13/19: unable at this time, 2/11: min A to step with RLE,  unable to step with LLE, 3/10:  able to take 2 steps in parallel bars with min A for safety; 4/1: min A for walking 10 feet in parallel bars, 5/24: min A walking 10 feet in parallel bars; 6/21: no change 7/14: no change    Time 12    Period Weeks    Status Partially Met    Target Date 02/02/20      PT LONG TERM GOAL #6   Title Patient will be able to safety navigate up/down ramp with power chair, exhibiting good safety awareness, mod I to safely enter/exit his home.    Baseline 2/11: supervision with intermittent min A;4/1: supervision; 5/24: supervision    Time 12    Period Weeks    Status Partially Met    Target Date 02/02/20      PT LONG TERM GOAL #7   Title Patient will increase functional reach test to >15 inches in sitting to exhibit improved sitting balance and positioning and reduce fall risk;    Baseline 07/30/18: 12 inches; 09/11/18: 10-12 inches; 10/23/18: 12-13 inch; 10/12: 14.5", 10/29: 15.5 inch    Time 4    Period Weeks    Status Achieved    Target Date 02/02/20      PT LONG TERM GOAL #8   Title Patient will increase LLE quad strength to 3+/5 to improve functional strength for standing and mobility;    Baseline 10/28: 2+/5; 01/13/19: 2+/5, 2/11: 3-/5, 04/16/19: 3-/5, 05/08/19: 3-/5, 5/24: 3-/5, 6/21: 3-/5, 7/14: 3-/5    Time 12    Period Weeks    Status Partially Met    Target Date 02/02/20      PT LONG TERM GOAL  #9   TITLE Patient will report no vertigo with provoking motions or positions in order to be able to perform ADLs and mobility tasks without symptoms.    Baseline 06/30/19: reports no dizziness in last week; 6/21: one episode this weekend; 7/14: experiencing one a week;    Time 12    Period Weeks    Status Partially Met    Target Date 02/02/20                 Plan - 12/16/19 0926    Clinical Impression Statement Patient motivated and participated fair within session. He does report feeling well at start of session, however after standing 1 repetition he reports increased fatigue.  Patient has missed several sessions due to transportation and decreased caregiver assistance. He reports mixed adherence with HEP. Patient does exhibit increased difficulty with forward weight shift. This leads to decreased sit to stand ability. patient instructed in seated and standing exercise with cues for proper weight shift and exercise technique. He was also instructed in advanced HEP with written instruction for better adherence. He would benefit from additional skilled PT Intervention to improve strength, balance and mobility;    Personal Factors and Comorbidities Age;Comorbidity 3+    Comorbidities Relevant past medical history and comorbidities include long term steroid use for lupus, CKD, chronic osteomyelitis, cervical spine stenosis, BPH, APS, alcohol abuse, peripheral artery disease, depression, GERD, obstructive sleep apnea, HTN, IBS, IgA deficiency, lumbar radiculopathy, RA, Stroke, toxic maculopathy in both eyes, systemic lupus erythematosus related syndrome, cardiac catheterization, carotid endarterectomy, cervical laminectomy, coronary angioplasty, Fusion C5-C7, knee arthroplasty, lumbar surgery.    Examination-Activity Limitations Bed Mobility;Bend;Sit;Toileting;Stand;Stairs;Lift;Transfers;Squat;Locomotion Level;Carry;Dressing;Hygiene/Grooming;Continence    Examination-Participation Restrictions Yard Work;Interpersonal Relationship;Community Activity    Stability/Clinical Decision Making Evolving/Moderate complexity    Rehab Potential Fair  PT Frequency 3x / week    PT Duration 12 weeks    PT Treatment/Interventions ADLs/Self Care Home Management;Electrical Stimulation;Moist Heat;Cryotherapy;Gait training;Stair training;Functional mobility training;Therapeutic exercise;Balance training;Neuromuscular re-education;Cognitive remediation;Patient/family education;Orthotic Fit/Training;Wheelchair mobility training;Manual techniques;Passive range of motion;Energy conservation;Joint  Manipulations;Canalith Repostioning;Therapeutic activities;Vestibular    PT Next Visit Plan Work on strength, sitting/standing balance, functional activities such as rolling, bed mobility, transfers; caregiver training    PT Home Exercise Plan no updates this session    Consulted and Agree with Plan of Care Patient;Family member/caregiver    Family Member Consulted DTR           Patient will benefit from skilled therapeutic intervention in order to improve the following deficits and impairments:  Abnormal gait, Decreased activity tolerance, Decreased cognition, Decreased endurance, Decreased knowledge of use of DME, Decreased range of motion, Decreased skin integrity, Decreased strength, Impaired perceived functional ability, Impaired sensation, Impaired UE functional use, Improper body mechanics, Pain, Cardiopulmonary status limiting activity, Decreased balance, Decreased coordination, Decreased mobility, Difficulty walking, Impaired tone, Postural dysfunction, Dizziness  Visit Diagnosis: Muscle weakness (generalized)  Other lack of coordination  Difficulty in walking, not elsewhere classified  Repeated falls     Problem List Patient Active Problem List   Diagnosis Date Noted  . Sepsis ([place]) 10/18/2018    Aralynn Brake PT, DPT 12/16/2019, 9:33 AM  [place] MAIN [facility] SERVICES [address] [place], [place], [address] Phone: [phone]   Fax:  [phone]  Name: Kenneth Summers MRN: [id number] Date of Birth: 11-Jan-1946

## 2019-12-17 ENCOUNTER — Ambulatory Visit: Payer: Medicare Other | Admitting: Physical Therapy

## 2019-12-18 ENCOUNTER — Encounter: Payer: Medicare Other | Admitting: Occupational Therapy

## 2019-12-18 ENCOUNTER — Ambulatory Visit: Payer: Medicare Other | Admitting: Physical Therapy

## 2019-12-22 ENCOUNTER — Encounter: Payer: Self-pay | Admitting: Occupational Therapy

## 2019-12-22 ENCOUNTER — Other Ambulatory Visit: Payer: Self-pay

## 2019-12-22 ENCOUNTER — Ambulatory Visit: Payer: Medicare Other | Admitting: Physical Therapy

## 2019-12-22 ENCOUNTER — Encounter: Payer: Self-pay | Admitting: Physical Therapy

## 2019-12-22 ENCOUNTER — Ambulatory Visit: Payer: Medicare Other | Admitting: Occupational Therapy

## 2019-12-22 DIAGNOSIS — M6281 Muscle weakness (generalized): Secondary | ICD-10-CM

## 2019-12-22 DIAGNOSIS — R296 Repeated falls: Secondary | ICD-10-CM

## 2019-12-22 DIAGNOSIS — R278 Other lack of coordination: Secondary | ICD-10-CM

## 2019-12-22 DIAGNOSIS — R262 Difficulty in walking, not elsewhere classified: Secondary | ICD-10-CM

## 2019-12-22 NOTE — Therapy (Signed)
[place] MAIN [facility] SERVICES [address] [place], [place], [address] Phone: [phone]   Fax:  [phone]  Physical Therapy Treatment  Patient Details  Name: Kenneth Summers MRN: [id number] Date of Birth: 11-11-1945 No data recorded  Encounter Date: 12/22/2019   PT End of Session - 12/22/19 1255    Visit Number [id number]    Number of Visits 252    Date for PT Re-Evaluation 02/02/20    PT Start Time 1302    PT Stop Time 1345    PT Time Calculation (min) 43 min    Equipment Utilized During Treatment Gait belt    Activity Tolerance Patient tolerated treatment well;No increased pain    Behavior During Therapy WFL for tasks assessed/performed           Past Medical History:  Diagnosis Date  . Actinic keratosis   . Alcohol abuse   . APS (antiphospholipid syndrome) ([place])   . Basal cell carcinoma   . BPH (benign prostatic hyperplasia)   . CAD (coronary artery disease)   . Cellulitis   . Cervical spinal stenosis   . Chronic osteomyelitis ([place])   . CKD (chronic kidney disease)   . CKD (chronic kidney disease)   . Clostridium difficile diarrhea   . Depression   . Diplopia   . Fatigue   . GERD (gastroesophageal reflux disease)   . Hyperlipemia   . Hypertension   . Hypovitaminosis D   . IBS (irritable bowel syndrome)   . 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 Insomnia   . Left lumbosacral radiculopathy   . Moderate obstructive sleep apnea   . Osteomyelitis of foot ([place])   . PAD (peripheral artery disease) ([place])   . Pruritus   . RA (rheumatoid arthritis) ([place])   . Radiculopathy   . Restless legs syndrome   . RLS (restless legs syndrome)   . SLE (systemic lupus erythematosus related syndrome) ([place])   . 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 Stroke ([place])   . Toxic maculopathy of both eyes     Past Surgical History:  Procedure Laterality Date  . CARDIAC CATHETERIZATION    . CAROTID ENDARTERECTOMY    .  CERVICAL LAMINECTOMY    . CORONARY ANGIOPLASTY    . FRACTURE SURGERY    . fusion C5-6-7    . HERNIA REPAIR    . KNEE ARTHROSCOPY    . TONSILLECTOMY      There were no vitals filed for this visit.   Subjective Assessment - 12/22/19 1302    Subjective Patient reports no soreness. He reports working on HEP some over the last week but is unsure of how much and is still limited with decreased caregiver support;    Pertinent History Patient is a 74 y.o. male who presents to outpatient physical therapy with a referral for medical diagnosis of CVA. This patient's chief complaints consist of left hemiplegia and overall deconditioning leading to the following functional deficits: dependent for ADLs, IADLs, unable to transfer to car, dependent transfers at home with hoyer lift, unable to walk or stand without significant assistance, difficulty with W/C navigation..    Diagnostic tests Brain MRI 11/01/2017: IMPRESSION: 1. Acute right ACA territory infarct as demonstrated on prior CT imaging. No intracranial hemorrhage or significant mass effect. 2. 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    Patient Stated Goals wants to be able to walk again  Currently in Pain? No/denies    Multiple Pain Sites No              TREATMENT: Prior to transfersand in between transfers: Patient sittingin wheelchair: -LLE only LAQ2x15with visual/tactile cues for increased AROM and to slow down LE movement for better full ROM and motor control;  -arms in lap, resting UE, erect posture,deep breaths x2 min with verbal and tactile cues for neutral weight shift; -hip flexion march x10 reps each LE with visual cues for increased ROM;Patient instructed to sit forward and not hold onto arm rests to reduce compensation of back with cues to reduce posterior loss of balance with hip flexion; He had significant difficulty not leaning backwards with less rail assist;  Seated Passive LLE  hamstring stretch 30 sec hold x1 reps for increased knee extension ROM; Required min VCs and visual cues for better AROM to increase strengthening;  Ptrequires verbal and visual cues to improve ROM;He was able to exhibit improved LLE AROM with cues; Patient also requires cues for erect posture for better trunk control;Patient exhibits improved neutral position this session with less lateral trunk lean;  Following exercise: Transferred sit<>Stand in parallel bars (seat adjusted to27inch from floor height) X6 repswithmin A;herequiredmodVCs for forward weight shift for better trunk control and transfer ability; Patientexhibits increased posterior lean this session requiring increased cues for forward weight shift for better neutral weight shift;  Patient required min Ato CGA for standing posture. Heexhibits increased left sided lean this session and increased posterior lean; Patient appears more fatigued today;  While standing: -side/side weight shift with cues for increased knee extension on LLE for increased weight bearing in LLE;  -mini squat2x15reps with min A for safety;Initially patient exhibits increased posterior lean with increased repetitions; Instructed patient to increase hip extension with 2nd set for better trunk/hip control  -standing supported x20-30 sec with cues for neutral weight shift;  Patient requiredmodA to scoot back in chair at end of session due to increased fatigue  Educated patient in HEP with instruction to work on increasing trunk control and challenging cardiovascular endurance with increased UE exercise for better exercise tolerance;   Patient was able to exhibit better initial forward lean with transfers but does fatigue quickly with standing with decreased hip extension in stance. He had increased difficulty with advanced seated exercise.  During session he reports increased dizziness with room spinning but no nystagmus noted.  This went away with short seated rest break;                          PT Education - 12/22/19 1255    Education Details transfers/positioning, LE strengthening, HEP    Person(s) Educated Patient    Methods Explanation;Verbal cues    Comprehension Verbalized understanding;Returned demonstration;Verbal cues required;Need further instruction            PT Short Term Goals - 08/18/19 1305      PT SHORT TERM GOAL #1   Title Be independent with initial home exercise program for self-management of symptoms.    Baseline HEP to be given at second session; advice to practice unsupported sitting at home (06/19/2018); 07/08/18: Performing at home, 6/29: needs assistance but is doing exercise program regularly; 08/22/18: adherent;    Time 2    Period Weeks    Status Achieved    Target Date 06/26/18             PT Long Term Goals - 11/10/19 1323  PT LONG TERM GOAL #1   Title Patient will complete all bed mobility with min A to improve functional independence for getting in and out of bed and adjusting in bed.     Baseline max A - total A reported by family (06/19/2018); 07/08/18: still requires maxA, dtr reports improvement in rolling since starting therapy; 6/23: min A to supervision in clinic; not doing at home right; 8/5: Pt's daughter states that his rolling has improved, but still has difficulty with all other bed mobility; 10/23/18: pts daughter states that she is doing about 35% of the work if he is in proper position, he does uses the bed rails to help roll;  using Hoyer for coming to sit EOB; 10/12: pts daughter states that she is doing about 30% of the work if he is in proper position, he does uses the bed rails to help roll, but it is improving;  using Hoyer for coming to sit EOB, 10/29: min A for sit to sidelying, rolling supervision, able to transition sidelying to sit with HHA;    Time 12    Period Weeks    Status Achieved    Target Date 02/02/20      PT LONG  TERM GOAL #2   Title Patient will complete sit <> stand transfer chair to chair with Min A and LRAD to improve functional independence for household and community mobility.     Baseline supervision for STS, still requires elevated surface height to 27"    Time 12    Period Weeks    Status Achieved    Target Date 02/02/20      PT LONG TERM GOAL #3   Title Patient will navigate power w/c with min A x 100 feet to improve mobility for household and short community distances.    Baseline required total A (06/12/2018); able to roll 20 feet with min A (06/20/2018); 07/08/18: Pt can go approximately 10' without assist per family;  10/23/2018: pt's daughter states that his power WC mobility has become 100% better with the new hand control, slowed speed, and with practice; 10/12: pt's daughter reports that he can perform household WC mobility and limited community ambulation with minA    Time 12    Period Weeks    Status Achieved    Target Date 02/02/20      PT LONG TERM GOAL #4   Title Patient will complete car transfers W/C to family SUV with min A using LRAD to improve his abilty to participate in community activities.     Baseline unable to complete car transfers at all (06/12/2018); unable to complete car transfers at all (06/19/2018).; 07/08/18: Unable to perform at this time; 09/11/18: unable to perform at this time; 10/23/2018: unable to complete car transfers at all; 10/12:  unable to complete car transfers at all, 10/29: unable; 01/13/19: unable; 2/11: min-mod A +2, 3/10: min A+1    Time 12    Period Weeks    Status Achieved    Target Date 02/02/20      PT LONG TERM GOAL #5   Title Patient will ambulate at least 10 feet with min A using LRAD to improve mobility for household and community distances.    Baseline unable to take steps (06/12/2018); able to shift R leg forward and back with max A and RW at edge of plinth (06/19/2018); 07/08/18: unable to ambulate at this time. 09/11/18: unable to perform at this  time; 10/23/2018: unable to perform at this time;  10/12: unable to perform at this time, 10/29: unable at this time; 01/13/19: unable at this time, 2/11: min A to step with RLE, unable to step with LLE, 3/10: able to take 2 steps in parallel bars with min A for safety; 4/1: min A for walking 10 feet in parallel bars, 5/24: min A walking 10 feet in parallel bars; 6/21: no change 7/14: no change    Time 12    Period Weeks    Status Partially Met    Target Date 02/02/20      PT LONG TERM GOAL #6   Title Patient will be able to safety navigate up/down ramp with power chair, exhibiting good safety awareness, mod I to safely enter/exit his home.    Baseline 2/11: supervision with intermittent min A;4/1: supervision; 5/24: supervision    Time 12    Period Weeks    Status Partially Met    Target Date 02/02/20      PT LONG TERM GOAL #7   Title Patient will increase functional reach test to >15 inches in sitting to exhibit improved sitting balance and positioning and reduce fall risk;    Baseline 07/30/18: 12 inches; 09/11/18: 10-12 inches; 10/23/18: 12-13 inch; 10/12: 14.5", 10/29: 15.5 inch    Time 4    Period Weeks    Status Achieved    Target Date 02/02/20      PT LONG TERM GOAL #8   Title Patient will increase LLE quad strength to 3+/5 to improve functional strength for standing and mobility;    Baseline 10/28: 2+/5; 01/13/19: 2+/5, 2/11: 3-/5, 04/16/19: 3-/5, 05/08/19: 3-/5, 5/24: 3-/5, 6/21: 3-/5, 7/14: 3-/5    Time 12    Period Weeks    Status Partially Met    Target Date 02/02/20      PT LONG TERM GOAL  #9   TITLE Patient will report no vertigo with provoking motions or positions in order to be able to perform ADLs and mobility tasks without symptoms.    Baseline 06/30/19: reports no dizziness in last week; 6/21: one episode this weekend; 7/14: experiencing one a week;    Time 12    Period Weeks    Status Partially Met    Target Date 02/02/20                 Plan - 12/23/19 0806     Clinical Impression Statement Patient motivated and participated well within session. He does exhibit better forward lean initially this session but continues to require min A and cues for forward lean during transfer. Patient also required cues for forward weight shift and hip extension in standing for better stance control. He does fatigue with prolonged standing. Educated patient in LE strengthening with cues to slow down LE movement for better AROM and motor control. He had increased difficulty with seated marches without UE support with decreased trunk control. Patient would benefit from additional skilled PT intervention to improve strength, balance and mobility;    Personal Factors and Comorbidities Age;Comorbidity 3+    Comorbidities Relevant past medical history and comorbidities include long term steroid use for lupus, CKD, chronic osteomyelitis, cervical spine stenosis, BPH, APS, alcohol abuse, peripheral artery disease, depression, GERD, obstructive sleep apnea, HTN, IBS, IgA deficiency, lumbar radiculopathy, RA, Stroke, toxic maculopathy in both eyes, systemic lupus erythematosus related syndrome, cardiac catheterization, carotid endarterectomy, cervical laminectomy, coronary angioplasty, Fusion C5-C7, knee arthroplasty, lumbar surgery.    Examination-Activity Limitations Bed Mobility;Bend;Sit;Toileting;Stand;Stairs;Lift;Transfers;Squat;Locomotion Level;Carry;Dressing;Hygiene/Grooming;Continence    Examination-Participation Restrictions  Yard Work;Interpersonal Relationship;Community Activity    Stability/Clinical Decision Making Evolving/Moderate complexity    Rehab Potential Fair    PT Frequency 3x / week    PT Duration 12 weeks    PT Treatment/Interventions ADLs/Self Care Home Management;Electrical Stimulation;Moist Heat;Cryotherapy;Gait training;Stair training;Functional mobility training;Therapeutic exercise;Balance training;Neuromuscular re-education;Cognitive remediation;Patient/family  education;Orthotic Fit/Training;Wheelchair mobility training;Manual techniques;Passive range of motion;Energy conservation;Joint Manipulations;Canalith Repostioning;Therapeutic activities;Vestibular    PT Next Visit Plan Work on strength, sitting/standing balance, functional activities such as rolling, bed mobility, transfers; caregiver training    PT Home Exercise Plan no updates this session    Consulted and Agree with Plan of Care Patient;Family member/caregiver    Family Member Consulted DTR           Patient will benefit from skilled therapeutic intervention in order to improve the following deficits and impairments:  Abnormal gait, Decreased activity tolerance, Decreased cognition, Decreased endurance, Decreased knowledge of use of DME, Decreased range of motion, Decreased skin integrity, Decreased strength, Impaired perceived functional ability, Impaired sensation, Impaired UE functional use, Improper body mechanics, Pain, Cardiopulmonary status limiting activity, Decreased balance, Decreased coordination, Decreased mobility, Difficulty walking, Impaired tone, Postural dysfunction, Dizziness  Visit Diagnosis: Muscle weakness (generalized)  Other lack of coordination  Difficulty in walking, not elsewhere classified  Repeated falls     Problem List Patient Active Problem List   Diagnosis Date Noted  . Sepsis ([place]) 10/18/2018    Moyinoluwa Dawe PT, DPT 12/23/2019, 8:08 AM  [place] MAIN [facility] SERVICES [address] [place], [place], [address] Phone: [phone]   Fax:  [phone]  Name: Kenneth Summers MRN: [id number] Date of Birth: August 20, 1945

## 2019-12-22 NOTE — Therapy (Signed)
[place] MAIN [facility] SERVICES [address] [place], [place], [address] Phone: [phone]   Fax:  [phone]  Occupational Therapy Treatment  Patient Details  Name: Kenneth Summers MRN: [id number] Date of Birth: 03/30/1945 No data recorded  Encounter Date: 12/22/2019   OT End of Session - 12/22/19 1523    Visit Number 152    Number of Visits 157    Date for OT Re-Evaluation 03/01/20    Authorization Type Progress report period starting 07/21/2019    OT Start Time 1350    OT Stop Time 1430    OT Time Calculation (min) 40 min    Activity Tolerance Patient tolerated treatment well    Behavior During Therapy [facility] for tasks assessed/performed           Past Medical History:  Diagnosis Date  . Actinic keratosis   . Alcohol abuse   . APS (antiphospholipid syndrome) ([place])   . Basal cell carcinoma   . BPH (benign prostatic hyperplasia)   . CAD (coronary artery disease)   . Cellulitis   . Cervical spinal stenosis   . Chronic osteomyelitis ([place])   . CKD (chronic kidney disease)   . CKD (chronic kidney disease)   . Clostridium difficile diarrhea   . Depression   . Diplopia   . Fatigue   . GERD (gastroesophageal reflux disease)   . Hyperlipemia   . Hypertension   . Hypovitaminosis D   . IBS (irritable bowel syndrome)   . 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 Insomnia   . Left lumbosacral radiculopathy   . Moderate obstructive sleep apnea   . Osteomyelitis of foot ([place])   . PAD (peripheral artery disease) ([place])   . Pruritus   . RA (rheumatoid arthritis) ([place])   . Radiculopathy   . Restless legs syndrome   . RLS (restless legs syndrome)   . SLE (systemic lupus erythematosus related syndrome) ([place])   . 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 Stroke ([place])   . Toxic maculopathy of both eyes     Past Surgical History:  Procedure Laterality Date  . CARDIAC CATHETERIZATION    . CAROTID ENDARTERECTOMY     . CERVICAL LAMINECTOMY    . CORONARY ANGIOPLASTY    . FRACTURE SURGERY    . fusion C5-6-7    . HERNIA REPAIR    . KNEE ARTHROSCOPY    . TONSILLECTOMY      There were no vitals filed for this visit.  OT TREATMENT   Therapeutic Exercise:  Pt. performed 2.5#dowelfor UE strengthening secondary to weakness.Pt.tolerated increased dowel weight.Bilateral shoulder flexion, chest press,andcircular patterns were performedfor1 sets 20 reps each.3# RUE, 2# for LUE dumbbell ex. for elbow flexion and extension, forearm supination/pronation, wrist flexion/extension, and radial deviation. Pt. requires rest breaks and verbal cues for proper technique.  Pt. Reports that he is fatigued today, and that he slept for 11 hours last night. Pt. continuesto make progress with BUE strength, and tolerance for there. Ex. Pt.continues to present with weakness, as well asimpaired motor control, and motor planning.Pt. tolerated BUE exercises well, with no reportsofdiscomfort. Pt. required consistent verbal, visual, and tactile cues. Pt. Continues to require less cuesto perform UE strengthening exercises, technique, and formas well as visual demonstration prior to the exercises.Pt. continues to work on improving BUE strength, and [facility] skills in order to improve LUE functioning during ADLs, IADLs, and tranfers.  OT Education - 12/22/19 1523    Education Details BUE ther. ex    Person(s) Educated Patient    Methods Explanation;Demonstration    Comprehension Returned demonstration;Tactile cues required;Verbalized understanding;Need further instruction               OT Long Term Goals - 12/08/19 1437      OT LONG TERM GOAL #1   Title Pt. will increase left shoulder flexion AROM by 10 degrees to access cabinet/shelf.    Baseline Pt. continues to progress with left shoulder ROM .Shouler flexion 130, and continues to work on reaching with increasing  emphasis on flexing trunk to improve functional reach.  07/21/19- shoulder flexion to 130 with effort    Time 12    Period Weeks    Status Not Met    Target Date 03/01/20      OT LONG TERM GOAL #2   Title Pt. will donn a shirt with Supervision.    Baseline Pt. continues to require MinA donning a zip down jacket with consistent cues to initiate the correct direction of the jacket.  MinA for donning a T-shirt with assist to roll side to side to pull the shirt down over his back. 07/21/19 continues to require min assist at times for shirt/jacket    Time 12    Period Weeks    Status On-going    Target Date 03/01/20      OT LONG TERM GOAL #3   Title Pt. will require ModA to perform LE dressing    Baseline MaxA left, ModA with the right. Pt. has sores on his ankle, and feet. Pt.'s daughter is assisting.    Time 12    Period Weeks    Status Deferred    Target Date 03/02/19      OT LONG TERM GOAL #4   Title Pt. will improve left grip strength by 10# to be able to open a jar/container.    Baseline Pt. continues to present with limited left hand grip strength. Pt. continues to work towards opening containers, and bottles.    Time 12    Period Weeks    Status On-going    Target Date 03/02/19      OT LONG TERM GOAL #5   Title Pt. will improve left hand [facility] skills to be able to assist with buttoning/zipping.    Baseline Pt. has improved overall donning a jacket, and  manipulating the zipper on his jacket, however continues to require practice. Pt. continues to work on improving bilateral [facility] skills for buttoning/zipping.  07/21/19 improving with buttons and zippers.    Time 12    Period Weeks    Status On-going    Target Date 03/01/20      OT LONG TERM GOAL #6   Title Pt. will demonstrate visual conmpensatory strategies for 100% of the time during ADLs    Baseline Pt.continues to require intermittent cues cues for left sides awareness.    Time 12    Period Weeks    Status Partially Met     Target Date 03/01/20      OT LONG TERM GOAL #7   Title Pt. will prepare a simple cold snack from w/c with supervision using cognitive compensatory strategies 100% of the time.    Baseline Pt. has difficulty with getting the w/c close enough to access the refrigerator    Time 12    Period Weeks    Status Deferred      OT  LONG TERM GOAL #8   Title Pt. will accurately identify potential safety hazard using good safety awareness, and judgement 100% for ADLs, and IADLs.    Baseline Pt. continues to require cues    Time 12    Period Weeks    Status On-going    Target Date 03/01/20      OT LONG TERM GOAL  #9   TITLE Pt. will  navigate his w/c around obstacles with Supervision and 100% accuracy    Baseline Pt. continues to require cuing for obstacles on the left, however requires less cues overall. Pt. requires significant cues to engage his left UE during tasks. 07/21/2019 continues to improve with decreased assist at times    Time 12    Period Weeks    Status On-going    Target Date 03/01/20      OT LONG TERM GOAL  #10   TITLE Pt. will independently be able  to reach to place items into cabinetry, and closets.    Baseline Pt. BUE strength is improving. UE functional reaching conitnues to be limited.    Time 12    Period Weeks    Status On-going    Target Date 03/01/20      OT LONG TERM GOAL  #11   TITLE Pt. will increase BUE strength by 52m grades in preparation for ADL transfers.    Baseline Pt. in improveing BUE strength, and  is able to tolertae increased resistive weights. Pt. continues to work towards improving overall strength.    Time 12    Period Weeks    Status On-going    Target Date 03/01/20      OT LONG TERM GOAL  #12   TITLE Pt. will improve LUE functional reaching with the left engaging the trunk 100% of the time during ADL tasks with minimal cues.    Baseline Pt. conitnues to improve with LUE functional reaching. Fewer cues are required to engage his trunk for  extended reaching.    Time 12    Period Weeks    Status On-going    Target Date 03/01/20                 Plan - 12/22/19 1524    Clinical Impression Statement Pt. Reports that he is fatigued today, and that he slept for 11 hours last night. Pt. continuesto make progress with BUE strength, and tolerance for there. Ex. Pt.continues to present with weakness, as well asimpaired motor control, and motor planning.Pt. tolerated BUE exercises well, with no reportsofdiscomfort. Pt. required consistent verbal, visual, and tactile cues. Pt. Continues to require less cuesto perform UE strengthening exercises, technique, and formas well as visual demonstration prior to the exercises.Pt. continues to work on improving BUE strength, and [facility] in order to improve LUE functioning during ADLs, IADLs, and tranfers.   OT Occupational Profile and History Problem Focused Assessment - Including review of records relating to presenting problem    Occupational performance deficits (Please refer to evaluation for details): ADL's    Body Structure / Function / Physical Skills UE functional use;Coordination;FMC;Dexterity;Strength;ROM    Cognitive Skills Attention;Memory;Emotional;Problem Solve;Safety Awareness    Rehab Potential Good    Clinical Decision Making Several treatment options, min-mod task modification necessary    Comorbidities Affecting Occupational Performance: Presence of comorbidities impacting occupational performance    Modification or Assistance to Complete Evaluation  No modification of tasks or assist necessary to complete eval    OT Frequency 2x / week  OT Duration 12 weeks    OT Treatment/Interventions Self-care/ADL training;DME and/or AE instruction;Therapeutic exercise;Therapeutic activities;Moist Heat;Cognitive remediation/compensation;Neuromuscular education;Visual/perceptual remediation/compensation;Coping strategies training;Patient/family education;Passive range of  motion;Psychosocial skills training;Energy conservation;Functional Mobility Training    Consulted and Agree with Plan of Care Patient;Family member/caregiver    Family Member Consulted daughter [name]           Patient will benefit from skilled therapeutic intervention in order to improve the following deficits and impairments:   Body Structure / Function / Physical Skills: UE functional use, Coordination, FMC, Dexterity, Strength, ROM Cognitive Skills: Attention, Memory, Emotional, Problem Solve, Safety Awareness     Visit Diagnosis: Muscle weakness (generalized)  Other lack of coordination    Problem List Patient Active Problem List   Diagnosis Date Noted  . Sepsis ([place]) 10/18/2018    Harrel Carina, MS, OTR/L 12/22/2019, 3:26 PM  [place] MAIN [facility] SERVICES [address] [place], [place], [address] Phone: [phone]   Fax:  [phone]  Name: Kenneth Summers MRN: [id number] Date of Birth: 1945/04/13

## 2019-12-24 ENCOUNTER — Ambulatory Visit: Payer: Medicare Other | Admitting: Physical Therapy

## 2019-12-25 ENCOUNTER — Encounter: Payer: Self-pay | Admitting: Physical Therapy

## 2019-12-25 ENCOUNTER — Encounter: Payer: Self-pay | Admitting: Occupational Therapy

## 2019-12-25 ENCOUNTER — Ambulatory Visit: Payer: Medicare Other | Admitting: Physical Therapy

## 2019-12-25 ENCOUNTER — Ambulatory Visit: Payer: Medicare Other | Admitting: Occupational Therapy

## 2019-12-25 ENCOUNTER — Other Ambulatory Visit: Payer: Self-pay

## 2019-12-25 DIAGNOSIS — M6281 Muscle weakness (generalized): Secondary | ICD-10-CM

## 2019-12-25 DIAGNOSIS — R296 Repeated falls: Secondary | ICD-10-CM

## 2019-12-25 DIAGNOSIS — R278 Other lack of coordination: Secondary | ICD-10-CM

## 2019-12-25 DIAGNOSIS — R262 Difficulty in walking, not elsewhere classified: Secondary | ICD-10-CM

## 2019-12-25 NOTE — Therapy (Signed)
[place] MAIN [facility] SERVICES [address] [place], [place], [address] Phone: [phone]   Fax:  [phone]  Physical Therapy Treatment  Patient Details  Name: Kenneth Summers MRN: [id number] Date of Birth: Jun 05, 1945 No data recorded  Encounter Date: 12/25/2019   PT End of Session - 12/25/19 1420    Visit Number [id number]    Number of Visits 252    Date for PT Re-Evaluation 02/02/20    PT Start Time 1346    PT Stop Time 1430    PT Time Calculation (min) 44 min    Equipment Utilized During Treatment Gait belt    Activity Tolerance Patient tolerated treatment well;No increased pain    Behavior During Therapy WFL for tasks assessed/performed           Past Medical History:  Diagnosis Date  . Actinic keratosis   . Alcohol abuse   . APS (antiphospholipid syndrome) ([place])   . Basal cell carcinoma   . BPH (benign prostatic hyperplasia)   . CAD (coronary artery disease)   . Cellulitis   . Cervical spinal stenosis   . Chronic osteomyelitis ([place])   . CKD (chronic kidney disease)   . CKD (chronic kidney disease)   . Clostridium difficile diarrhea   . Depression   . Diplopia   . Fatigue   . GERD (gastroesophageal reflux disease)   . Hyperlipemia   . Hypertension   . Hypovitaminosis D   . IBS (irritable bowel syndrome)   . 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 Insomnia   . Left lumbosacral radiculopathy   . Moderate obstructive sleep apnea   . Osteomyelitis of foot ([place])   . PAD (peripheral artery disease) ([place])   . Pruritus   . RA (rheumatoid arthritis) ([place])   . Radiculopathy   . Restless legs syndrome   . RLS (restless legs syndrome)   . SLE (systemic lupus erythematosus related syndrome) ([place])   . 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 Stroke ([place])   . Toxic maculopathy of both eyes     Past Surgical History:  Procedure Laterality Date  . CARDIAC CATHETERIZATION    . CAROTID ENDARTERECTOMY    .  CERVICAL LAMINECTOMY    . CORONARY ANGIOPLASTY    . FRACTURE SURGERY    . fusion C5-6-7    . HERNIA REPAIR    . KNEE ARTHROSCOPY    . TONSILLECTOMY      There were no vitals filed for this visit.   Subjective Assessment - 12/25/19 1411    Subjective Patient reports doing okay. He reports having increased dizziness at end of OT session, BP was 98/62 at end of OT session;    Pertinent History Patient is a 74 y.o. male who presents to outpatient physical therapy with a referral for medical diagnosis of CVA. This patient's chief complaints consist of left hemiplegia and overall deconditioning leading to the following functional deficits: dependent for ADLs, IADLs, unable to transfer to car, dependent transfers at home with hoyer lift, unable to walk or stand without significant assistance, difficulty with W/C navigation..    Diagnostic tests Brain MRI 11/01/2017: IMPRESSION: 1. Acute right ACA territory infarct as demonstrated on prior CT imaging. No intracranial hemorrhage or significant mass effect. 2. 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    Patient Stated Goals wants to be able to walk again    Currently in Pain?  No/denies            TREATMENT:  Patient presents to PT session with new onset dizziness and nystagmus which was alleviated with hooklying position in chair;  PT transferred patient wheelchair to mat table with mod A +2 with elevated wheelchair to lower mat surface for increased ease;  PT performed dix-hallpike test bilaterally (-) and was asymptomatic throughout  PT also performed roll test bilaterally which was (-) and was asymptomatic throughout;    Patient does require mod A +2 for transitioning supine to sitting due to poor trunk control and tightness in lumbar spine and hamstring;  Patient transferred squat pivot mat table to wheelchair max A+2  Patient was taken to parallel bars with power chair:   Prior to transfersand  in between transfers: Patient sittingin wheelchair: -LLE only LAQx15with visual/tactile cues for increased AROM and to slow down LE movement for better full ROM and motor control;  -arms in lap, resting UE, erect posture,deep breaths x2 min with verbal and tactile cues for neutral weight shift; -hip flexion march x10 reps each LE with visual cues for increased ROM;Patient instructed to sit forward and not hold onto arm rests to reduce compensation of back with cues to reduce posterior loss of balance with hip flexion;He had significant difficulty not leaning backwards with less rail assist;  Required min VCs and visual cues for better AROM to increase strengthening;  Ptrequires verbal and visual cues to improve ROM;He was able to exhibit improved LLE AROM with cues; Patient also requires cues for erect posture for better trunk control;Patient exhibits improved neutral position this session with less lateral trunk lean;  Following exercise: Transferred sit<>Stand in parallel bars (seat adjusted to27inch from floor height) X2 repswithmin A;herequiredmodVCs for forward weight shift for better trunk control and transfer ability; Patientrequired mod VCS for forward weight shift and to increase hip extension for better weight bearing in LE;  Patient required min Ato CGA for standing posture.  While standing: -mini squatx15reps with CGA for safety;Initially patient exhibits increased posterior lean with increased repetitions; Patient was able to exhibit better hip extension following cues requiring less assistance; -LLE weight shift with RLE hip flexion march x10 reps with cues for hip extension and to increase forward weight shift for better LE weight bearing;    Patient requiredmodA to scoot back in chair at end of session due to increased fatigue  Patient was able to exhibit better initial forward lean with transfers but does fatigue quickly with standing with  decreased hip extension in stance. He had increased difficulty with advanced seated exercise.                           PT Education - 12/25/19 1419    Education Details transfer/positioning; LE strengthening, HEP    Person(s) Educated Patient    Methods Explanation;Verbal cues    Comprehension Verbalized understanding;Returned demonstration;Verbal cues required;Need further instruction            PT Short Term Goals - 08/18/19 1305      PT SHORT TERM GOAL #1   Title Be independent with initial home exercise program for self-management of symptoms.    Baseline HEP to be given at second session; advice to practice unsupported sitting at home (06/19/2018); 07/08/18: Performing at home, 6/29: needs assistance but is doing exercise program regularly; 08/22/18: adherent;    Time 2    Period Weeks    Status Achieved  Target Date 06/26/18             PT Long Term Goals - 11/10/19 1323      PT LONG TERM GOAL #1   Title Patient will complete all bed mobility with min A to improve functional independence for getting in and out of bed and adjusting in bed.     Baseline max A - total A reported by family (06/19/2018); 07/08/18: still requires maxA, dtr reports improvement in rolling since starting therapy; 6/23: min A to supervision in clinic; not doing at home right; 8/5: Pt's daughter states that his rolling has improved, but still has difficulty with all other bed mobility; 10/23/18: pts daughter states that she is doing about 35% of the work if he is in proper position, he does uses the bed rails to help roll;  using Hoyer for coming to sit EOB; 10/12: pts daughter states that she is doing about 30% of the work if he is in proper position, he does uses the bed rails to help roll, but it is improving;  using Hoyer for coming to sit EOB, 10/29: min A for sit to sidelying, rolling supervision, able to transition sidelying to sit with HHA;    Time 12    Period Weeks    Status  Achieved    Target Date 02/02/20      PT LONG TERM GOAL #2   Title Patient will complete sit <> stand transfer chair to chair with Min A and LRAD to improve functional independence for household and community mobility.     Baseline supervision for STS, still requires elevated surface height to 27"    Time 12    Period Weeks    Status Achieved    Target Date 02/02/20      PT LONG TERM GOAL #3   Title Patient will navigate power w/c with min A x 100 feet to improve mobility for household and short community distances.    Baseline required total A (06/12/2018); able to roll 20 feet with min A (06/20/2018); 07/08/18: Pt can go approximately 10' without assist per family;  10/23/2018: pt's daughter states that his power WC mobility has become 100% better with the new hand control, slowed speed, and with practice; 10/12: pt's daughter reports that he can perform household WC mobility and limited community ambulation with minA    Time 12    Period Weeks    Status Achieved    Target Date 02/02/20      PT LONG TERM GOAL #4   Title Patient will complete car transfers W/C to family SUV with min A using LRAD to improve his abilty to participate in community activities.     Baseline unable to complete car transfers at all (06/12/2018); unable to complete car transfers at all (06/19/2018).; 07/08/18: Unable to perform at this time; 09/11/18: unable to perform at this time; 10/23/2018: unable to complete car transfers at all; 10/12:  unable to complete car transfers at all, 10/29: unable; 01/13/19: unable; 2/11: min-mod A +2, 3/10: min A+1    Time 12    Period Weeks    Status Achieved    Target Date 02/02/20      PT LONG TERM GOAL #5   Title Patient will ambulate at least 10 feet with min A using LRAD to improve mobility for household and community distances.    Baseline unable to take steps (06/12/2018); able to shift R leg forward and back with max A and RW  at edge of plinth (06/19/2018); 07/08/18: unable to ambulate at  this time. 09/11/18: unable to perform at this time; 10/23/2018: unable to perform at this time; 10/12: unable to perform at this time, 10/29: unable at this time; 01/13/19: unable at this time, 2/11: min A to step with RLE, unable to step with LLE, 3/10: able to take 2 steps in parallel bars with min A for safety; 4/1: min A for walking 10 feet in parallel bars, 5/24: min A walking 10 feet in parallel bars; 6/21: no change 7/14: no change    Time 12    Period Weeks    Status Partially Met    Target Date 02/02/20      PT LONG TERM GOAL #6   Title Patient will be able to safety navigate up/down ramp with power chair, exhibiting good safety awareness, mod I to safely enter/exit his home.    Baseline 2/11: supervision with intermittent min A;4/1: supervision; 5/24: supervision    Time 12    Period Weeks    Status Partially Met    Target Date 02/02/20      PT LONG TERM GOAL #7   Title Patient will increase functional reach test to >15 inches in sitting to exhibit improved sitting balance and positioning and reduce fall risk;    Baseline 07/30/18: 12 inches; 09/11/18: 10-12 inches; 10/23/18: 12-13 inch; 10/12: 14.5", 10/29: 15.5 inch    Time 4    Period Weeks    Status Achieved    Target Date 02/02/20      PT LONG TERM GOAL #8   Title Patient will increase LLE quad strength to 3+/5 to improve functional strength for standing and mobility;    Baseline 10/28: 2+/5; 01/13/19: 2+/5, 2/11: 3-/5, 04/16/19: 3-/5, 05/08/19: 3-/5, 5/24: 3-/5, 6/21: 3-/5, 7/14: 3-/5    Time 12    Period Weeks    Status Partially Met    Target Date 02/02/20      PT LONG TERM GOAL  #9   TITLE Patient will report no vertigo with provoking motions or positions in order to be able to perform ADLs and mobility tasks without symptoms.    Baseline 06/30/19: reports no dizziness in last week; 6/21: one episode this weekend; 7/14: experiencing one a week;    Time 12    Period Weeks    Status Partially Met    Target Date 02/02/20                  Plan - 12/25/19 1435    Clinical Impression Statement Patient participated well within session. He reports increased dizziness with nystagmus at start of session. He was transferred to mat table and therapist performed dix-hallpike test bilaterally which was negative. PT also performed roll test which was also negative bilaterally with patient being assymptomatic with all positioning. Patient does have difficulty achieving different positioning including long sitting due to hamstring tightness and stiffness in lumbar spine requiring mod-max A +2 for positioning throughout. Following testing, he was taken to parallel bars in power chair and worked on LE strengthening and sit<>stand transfers. Patient was able to exhibit better hip extension and knee extension in stance this session requiring CGA with most standing activities. He does require mod VCs for proper positioning and exercise technique. Patient would benefit from additional skilled PT Intervention to improve strength, balance and mobility;    Personal Factors and Comorbidities Age;Comorbidity 3+    Comorbidities Relevant past medical history and comorbidities include long term  steroid use for lupus, CKD, chronic osteomyelitis, cervical spine stenosis, BPH, APS, alcohol abuse, peripheral artery disease, depression, GERD, obstructive sleep apnea, HTN, IBS, IgA deficiency, lumbar radiculopathy, RA, Stroke, toxic maculopathy in both eyes, systemic lupus erythematosus related syndrome, cardiac catheterization, carotid endarterectomy, cervical laminectomy, coronary angioplasty, Fusion C5-C7, knee arthroplasty, lumbar surgery.    Examination-Activity Limitations Bed Mobility;Bend;Sit;Toileting;Stand;Stairs;Lift;Transfers;Squat;Locomotion Level;Carry;Dressing;Hygiene/Grooming;Continence    Examination-Participation Restrictions Yard Work;Interpersonal Relationship;Community Activity    Stability/Clinical Decision Making Evolving/Moderate  complexity    Rehab Potential Fair    PT Frequency 3x / week    PT Duration 12 weeks    PT Treatment/Interventions ADLs/Self Care Home Management;Electrical Stimulation;Moist Heat;Cryotherapy;Gait training;Stair training;Functional mobility training;Therapeutic exercise;Balance training;Neuromuscular re-education;Cognitive remediation;Patient/family education;Orthotic Fit/Training;Wheelchair mobility training;Manual techniques;Passive range of motion;Energy conservation;Joint Manipulations;Canalith Repostioning;Therapeutic activities;Vestibular    PT Next Visit Plan Work on strength, sitting/standing balance, functional activities such as rolling, bed mobility, transfers; caregiver training    PT Home Exercise Plan no updates this session    Consulted and Agree with Plan of Care Patient;Family member/caregiver    Family Member Consulted DTR           Patient will benefit from skilled therapeutic intervention in order to improve the following deficits and impairments:  Abnormal gait, Decreased activity tolerance, Decreased cognition, Decreased endurance, Decreased knowledge of use of DME, Decreased range of motion, Decreased skin integrity, Decreased strength, Impaired perceived functional ability, Impaired sensation, Impaired UE functional use, Improper body mechanics, Pain, Cardiopulmonary status limiting activity, Decreased balance, Decreased coordination, Decreased mobility, Difficulty walking, Impaired tone, Postural dysfunction, Dizziness  Visit Diagnosis: Muscle weakness (generalized)  Other lack of coordination  Difficulty in walking, not elsewhere classified  Repeated falls     Problem List Patient Active Problem List   Diagnosis Date Noted  . Sepsis ([place]) 10/18/2018    Marlies Ligman PT, DPT 12/25/2019, 2:49 PM  [place] MAIN [facility] SERVICES [address] [place], [place], [address] Phone: [phone]   Fax:   [phone]  Name: JESON CAMACHO MRN: [id number] Date of Birth: January 10, 1946

## 2019-12-25 NOTE — Therapy (Signed)
[place] [facility] SERVICES [address] [place], [place], [address] Phone: [phone]   Fax:  [phone]  Occupational Therapy Treatment  Patient Details  Name: Kenneth Summers MRN: [id number] Date of Birth: [birth year] No data recorded  Encounter Date: 12/25/2019   OT End of Session - 12/25/19 1315    Visit Number [id number]    Number of Visits 157    Date for OT Re-Evaluation 03/01/20    Authorization Type Progress report period starting 07/21/2019    OT Start Time 1345    OT Stop Time 1430    OT Time Calculation (min) 45 min    Activity Tolerance Patient tolerated treatment well    Behavior During Therapy [facility] for tasks assessed/performed           Past Medical History:  Diagnosis Date  . Actinic keratosis   . Alcohol abuse   . APS (antiphospholipid syndrome) ([place])   . Basal cell carcinoma   . BPH (benign prostatic hyperplasia)   . CAD (coronary artery disease)   . Cellulitis   . Cervical spinal stenosis   . Chronic osteomyelitis ([place])   . CKD (chronic kidney disease)   . CKD (chronic kidney disease)   . Clostridium difficile diarrhea   . Depression   . Diplopia   . Fatigue   . GERD (gastroesophageal reflux disease)   . Hyperlipemia   . Hypertension   . Hypovitaminosis D   . IBS (irritable bowel syndrome)   . 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 Insomnia   . Left lumbosacral radiculopathy   . Moderate obstructive sleep apnea   . Osteomyelitis of foot ([place])   . PAD (peripheral artery disease) ([place])   . Pruritus   . RA (rheumatoid arthritis) ([place])   . Radiculopathy   . Restless legs syndrome   . RLS (restless legs syndrome)   . SLE (systemic lupus erythematosus related syndrome) ([place])   . 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 Stroke ([place])   . Toxic maculopathy of both eyes     Past Surgical History:  Procedure Laterality Date  . CARDIAC CATHETERIZATION    . CAROTID ENDARTERECTOMY     . CERVICAL LAMINECTOMY    . CORONARY ANGIOPLASTY    . FRACTURE SURGERY    . fusion C5-6-7    . HERNIA REPAIR    . KNEE ARTHROSCOPY    . TONSILLECTOMY      There were no vitals filed for this visit.   Subjective Assessment - 12/25/19 1310    Patient is accompanied by: Family member    Pertinent History Pt. is a 74 y.o. 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 Pt. PMHx includes: Multiple Falls, Lupus, DJD, Renal Abscess, CVA. Pt. resides with his wife. Pt.'s wife and daughter assist with ADLs. Pt. has caregivers in  for 2 hours a day, 6 days a week. Pt. received Rehab services in acute care, at SNF for STR, and [place] services. Pt. is retired from [employer] for [employer] and [employer].    Currently in Pain? No/denies          OT TREATMENT   Therapeutic Exercise:  Pt. performed 3#dowelfor UE strengthening secondary to weakness.Pt.tolerated increased dowel weight.Bilateral shoulder flexion, chest press,andcircular patterns were performedfor2 sets 20 reps each.  Pt. worked on 3# dumbbell ex. for bilateral elbow flexion and extension,3# for right, 2# Left  for forearm supination/pronation, wrist flexion/extension 1 set of 20 and radial deviation 1 set 20 reps on right, 1 set 10 reps on the left. Pt. requires rest breaks and verbal cues for proper technique. Pt. worked on reaching with a 2# cuff weight in place. Pt. Pt. Worked on reaching with forward trunk flexion, and rotation.  Pt. reports that he feels better overall, and was been able to sleep better. Pt. had a dizziness episode with nystagmus during trunk exercises.  Pt. As assisted in reclining in his chair. BP 98/52. HR 67 bpms. Pt. continuesto make progress with BUE strength, and tolerance for there. Ex. Pt.continues to present with weakness, as well asimpaired motor control, and motor planning.Pt. tolerated BUE exercises well, with no reportsofdiscomfort. Pt. required  consistent verbal, visual, and tactile cues. Pt. continues to require less cuesto perform UE strengthening exercises, technique, and formas well as visual demonstration prior to the exercises.Pt. continues to work on improving BUE strength, and [facility] skills in order to improve LUE functioning during ADLs, IADLs, and tranfers.                         OT Education - 12/25/19 1315    Education Details BUE ther. ex    Person(s) Educated Patient    Methods Explanation;Demonstration    Comprehension Returned demonstration;Tactile cues required;Verbalized understanding;Need further instruction               OT Long Term Goals - 12/08/19 1437      OT LONG TERM GOAL #1   Title Pt. will increase left shoulder flexion AROM by 10 degrees to access cabinet/shelf.    Baseline Pt. continues to progress with left shoulder ROM .Shouler flexion 130, and continues to work on reaching with increasing emphasis on flexing trunk to improve functional reach.  07/21/19- shoulder flexion to 130 with effort    Time 12    Period Weeks    Status Not Met    Target Date 03/01/20      OT LONG TERM GOAL #2   Title Pt. will donn a shirt with Supervision.    Baseline Pt. continues to require MinA donning a zip down jacket with consistent cues to initiate the correct direction of the jacket.  MinA for donning a T-shirt with assist to roll side to side to pull the shirt down over his back. 07/21/19 continues to require min assist at times for shirt/jacket    Time 12    Period Weeks    Status On-going    Target Date 03/01/20      OT LONG TERM GOAL #3   Title Pt. will require ModA to perform LE dressing    Baseline MaxA left, ModA with the right. Pt. has sores on his ankle, and feet. Pt.'s daughter is assisting.    Time 12    Period Weeks    Status Deferred    Target Date 03/02/19      OT LONG TERM GOAL #4   Title Pt. will improve left grip strength by 10# to be able to open a jar/container.     Baseline Pt. continues to present with limited left hand grip strength. Pt. continues to work towards opening containers, and bottles.    Time 12    Period Weeks    Status On-going    Target Date 03/02/19      OT LONG TERM GOAL #5   Title Pt. will improve left hand [facility] skills  to be able to assist with buttoning/zipping.    Baseline Pt. has improved overall donning a jacket, and  manipulating the zipper on his jacket, however continues to require practice. Pt. continues to work on improving bilateral [facility] skills for buttoning/zipping.  07/21/19 improving with buttons and zippers.    Time 12    Period Weeks    Status On-going    Target Date 03/01/20      OT LONG TERM GOAL #6   Title Pt. will demonstrate visual conmpensatory strategies for 100% of the time during ADLs    Baseline Pt.continues to require intermittent cues cues for left sides awareness.    Time 12    Period Weeks    Status Partially Met    Target Date 03/01/20      OT LONG TERM GOAL #7   Title Pt. will prepare a simple cold snack from w/c with supervision using cognitive compensatory strategies 100% of the time.    Baseline Pt. has difficulty with getting the w/c close enough to access the refrigerator    Time 12    Period Weeks    Status Deferred      OT LONG TERM GOAL #8   Title Pt. will accurately identify potential safety hazard using good safety awareness, and judgement 100% for ADLs, and IADLs.    Baseline Pt. continues to require cues    Time 12    Period Weeks    Status On-going    Target Date 03/01/20      OT LONG TERM GOAL  #9   TITLE Pt. will  navigate his w/c around obstacles with Supervision and 100% accuracy    Baseline Pt. continues to require cuing for obstacles on the left, however requires less cues overall. Pt. requires significant cues to engage his left UE during tasks. 07/21/2019 continues to improve with decreased assist at times    Time 12    Period Weeks    Status On-going    Target  Date 03/01/20      OT LONG TERM GOAL  #10   TITLE Pt. will independently be able  to reach to place items into cabinetry, and closets.    Baseline Pt. BUE strength is improving. UE functional reaching conitnues to be limited.    Time 12    Period Weeks    Status On-going    Target Date 03/01/20      OT LONG TERM GOAL  #11   TITLE Pt. will increase BUE strength by 22m grades in preparation for ADL transfers.    Baseline Pt. in improveing BUE strength, and  is able to tolertae increased resistive weights. Pt. continues to work towards improving overall strength.    Time 12    Period Weeks    Status On-going    Target Date 03/01/20      OT LONG TERM GOAL  #12   TITLE Pt. will improve LUE functional reaching with the left engaging the trunk 100% of the time during ADL tasks with minimal cues.    Baseline Pt. conitnues to improve with LUE functional reaching. Fewer cues are required to engage his trunk for extended reaching.    Time 12    Period Weeks    Status On-going    Target Date 03/01/20                 Plan - 12/25/19 1321    Clinical Impression Statement Pt. reports that he feels better overall, and was  been able to sleep better. Pt. had a dizziness episode with nystagmus during trunk exercises.  Pt. As assisted in reclining in his chair. BP 98/52. HR 67 bpms. Pt. continuesto make progress with BUE strength, and tolerance for there. Ex. Pt.continues to present with weakness, as well asimpaired motor control, and motor planning.Pt. tolerated BUE exercises well, with no reportsofdiscomfort. Pt. required consistent verbal, visual, and tactile cues. Pt. continues to require less cuesto perform UE strengthening exercises, technique, and formas well as visual demonstration prior to the exercises.Pt. continues to work on improving BUE strength, and [facility] skills in order to improve LUE functioning during ADLs, IADLs, and tranfers.     OT Occupational Profile and History Problem  Focused Assessment - Including review of records relating to presenting problem    Occupational performance deficits (Please refer to evaluation for details): ADL's    Body Structure / Function / Physical Skills UE functional use;Coordination;FMC;Dexterity;Strength;ROM    Cognitive Skills Attention;Memory;Emotional;Problem Solve;Safety Awareness    Rehab Potential Good    Clinical Decision Making Several treatment options, min-mod task modification necessary    Comorbidities Affecting Occupational Performance: Presence of comorbidities impacting occupational performance    Comorbidities impacting occupational performance description: Phyical, cognitive, visual,  medical comorbidities    Modification or Assistance to Complete Evaluation  No modification of tasks or assist necessary to complete eval    OT Frequency 2x / week    OT Duration 12 weeks    OT Treatment/Interventions Self-care/ADL training;DME and/or AE instruction;Therapeutic exercise;Therapeutic activities;Moist Heat;Cognitive remediation/compensation;Neuromuscular education;Visual/perceptual remediation/compensation;Coping strategies training;Patient/family education;Passive range of motion;Psychosocial skills training;Energy conservation;Functional Mobility Training    Consulted and Agree with Plan of Care Patient;Family member/caregiver    Family Member Consulted daughter [name]           Patient will benefit from skilled therapeutic intervention in order to improve the following deficits and impairments:   Body Structure / Function / Physical Skills: UE functional use, Coordination, FMC, Dexterity, Strength, ROM Cognitive Skills: Attention, Memory, Emotional, Problem Solve, Safety Awareness     Visit Diagnosis: Muscle weakness (generalized)    Problem List Patient Active Problem List   Diagnosis Date Noted  . Sepsis ([place]) 10/18/2018    Harrel Carina, MS, OTR/L 12/25/2019, 1:24 PM  [place] [facility] SERVICES [address] [place], [place], [address] Phone: [phone]   Fax:  [phone]  Name: Kenneth Summers MRN: [id number] Date of Birth: December 14, 1945

## 2019-12-29 ENCOUNTER — Ambulatory Visit: Payer: Medicare Other | Admitting: Physical Therapy

## 2019-12-29 ENCOUNTER — Other Ambulatory Visit: Payer: Self-pay

## 2019-12-29 ENCOUNTER — Encounter: Payer: Self-pay | Admitting: Physical Therapy

## 2019-12-29 ENCOUNTER — Ambulatory Visit: Payer: Medicare Other | Admitting: Occupational Therapy

## 2019-12-29 ENCOUNTER — Encounter: Payer: Self-pay | Admitting: Occupational Therapy

## 2019-12-29 DIAGNOSIS — R278 Other lack of coordination: Secondary | ICD-10-CM

## 2019-12-29 DIAGNOSIS — R296 Repeated falls: Secondary | ICD-10-CM

## 2019-12-29 DIAGNOSIS — M6281 Muscle weakness (generalized): Secondary | ICD-10-CM

## 2019-12-29 DIAGNOSIS — R262 Difficulty in walking, not elsewhere classified: Secondary | ICD-10-CM

## 2019-12-29 NOTE — Therapy (Signed)
[place] [address] [place], [place], [address] Phone: [phone]   Fax:  [phone]  Physical Therapy Treatment  Patient Details  Name: Kenneth Summers MRN: [id number] Date of Birth: [birth year] No data recorded  Encounter Date: 12/29/2019   PT End of Session - 12/29/19 1256    Visit Number [id number]    Number of Visits 252    Date for PT Re-Evaluation 02/02/20    PT Start Time 1300    PT Stop Time 1345    PT Time Calculation (min) 45 min    Equipment Utilized During Treatment Gait belt    Activity Tolerance Patient tolerated treatment well;No increased pain    Behavior During Therapy WFL for tasks assessed/performed           Past Medical History:  Diagnosis Date  . Actinic keratosis   . Alcohol abuse   . APS (antiphospholipid syndrome) ([place])   . Basal cell carcinoma   . BPH (benign prostatic hyperplasia)   . CAD (coronary artery disease)   . Cellulitis   . Cervical spinal stenosis   . Chronic osteomyelitis ([place])   . CKD (chronic kidney disease)   . CKD (chronic kidney disease)   . Clostridium difficile diarrhea   . Depression   . Diplopia   . Fatigue   . GERD (gastroesophageal reflux disease)   . Hyperlipemia   . Hypertension   . Hypovitaminosis D   . IBS (irritable bowel syndrome)   . 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 Insomnia   . Left lumbosacral radiculopathy   . Moderate obstructive sleep apnea   . Osteomyelitis of foot ([place])   . PAD (peripheral artery disease) ([place])   . Pruritus   . RA (rheumatoid arthritis) ([place])   . Radiculopathy   . Restless legs syndrome   . RLS (restless legs syndrome)   . SLE (systemic lupus erythematosus related syndrome) ([place])   . 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 Stroke ([place])   . Toxic maculopathy of both eyes     Past Surgical History:  Procedure Laterality Date  . CARDIAC CATHETERIZATION    . CAROTID ENDARTERECTOMY    .  CERVICAL LAMINECTOMY    . CORONARY ANGIOPLASTY    . FRACTURE SURGERY    . fusion C5-6-7    . HERNIA REPAIR    . KNEE ARTHROSCOPY    . TONSILLECTOMY      There were no vitals filed for this visit.   Subjective Assessment - 12/29/19 1306    Subjective Patient reports increased LUE pain. he reports waking up with increased pain and having trouble with dressing. He did take over the counter pain meds to help with pain control;    Pertinent History Patient is a 74 y.o. male who presents to outpatient physical therapy with a referral for medical diagnosis of CVA. This patient's chief complaints consist of left hemiplegia and overall deconditioning leading to the following functional deficits: dependent for ADLs, IADLs, unable to transfer to car, dependent transfers at home with hoyer lift, unable to walk or stand without significant assistance, difficulty with W/C navigation..    Diagnostic tests Brain MRI 11/01/2017: IMPRESSION: 1. Acute right ACA territory infarct as demonstrated on prior CT imaging. No intracranial hemorrhage or significant mass effect. 2. 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    Patient Stated Goals wants to be able to  walk again    Currently in Pain? Yes    Pain Score 8     Pain Location Arm    Pain Orientation Left    Pain Descriptors / Indicators Sharp    Pain Type Acute pain    Pain Onset Today    Pain Frequency Intermittent    Aggravating Factors  unsure    Pain Relieving Factors medication/rest    Effect of Pain on Daily Activities decreased activity tolerance;    Multiple Pain Sites No                  TREATMENT: Prior to transfersand in between transfers: Seated Passive LLE hamstring stretch 30 sec hold x1 reps for increased knee extension ROM; Patient complained of increased LUE pain: PT performed passive LUE radial nerve stretch 30 sec hold, passive LUE ulnar nerve stretch 30 sec hold; He reports increased  pain with both stretches but then reports less pain following stretches;  Required min VCs and visual cues for better AROM to increase strengthening;  Following exercise: Transferred sit<>Stand in parallel bars (seat adjusted to27inch from floor height) X9 repswithmin A;herequiredmodVCs for forward weight shift for better trunk control and transfer ability; Patientexhibits increased posterior lean this session requiring increased cues for forward weight shift for better neutral weight shift;  Patient required min Ato CGA for standing posture. Heexhibits increased posterior lean; Patient appears more fatigued today;  While standing: -mini squatx15reps with min A for safety;Initially patient exhibits increased posterior lean with increased repetitions; Instructed patient to increase hip extension and was able to complete last 5 repetitions without therapist assist, close supervision;  -hip flexion march RLE x5 x2 sets reps with mod VCs for erect posture and forward weight shift; PT provided CGA with patient sitting after 5 repetitions due to heavy posterior lean. Educated patient in importance of forward weight shift for better LE weight bearing and strengthening;  -standing supported x20-30 sec with cues for neutral weight shift;  Patient requiredmodA to scoot back in chair at end of session due to increased fatigue   Patient was instructed in standing activities this session. Focused on LE exercise in standing with cues for forward weight shift and erect posture for better LE weight bearing. He reports increased LUE pain which was somewhat alleviated with nerve stretches. Patient was able to exhibit better sit<>stand ability this session with less posterior lean; He does have difficulty maintaining forward weight shift with LE movement, often falling posteriorly when moving LEs;                      PT Education - 12/29/19 1255    Education  Details transfers/positioning, LE strengthening, HEP    Person(s) Educated Patient    Methods Explanation;Verbal cues    Comprehension Verbalized understanding;Returned demonstration;Verbal cues required;Need further instruction            PT Short Term Goals - 08/18/19 1305      PT SHORT TERM GOAL #1   Title Be independent with initial home exercise program for self-management of symptoms.    Baseline HEP to be given at second session; advice to practice unsupported sitting at home (06/19/2018); 07/08/18: Performing at home, 6/29: needs assistance but is doing exercise program regularly; 08/22/18: adherent;    Time 2    Period Weeks    Status Achieved    Target Date 06/26/18             PT Long Term Goals -  11/10/19 1323      PT LONG TERM GOAL #1   Title Patient will complete all bed mobility with min A to improve functional independence for getting in and out of bed and adjusting in bed.     Baseline max A - total A reported by family (06/19/2018); 07/08/18: still requires maxA, dtr reports improvement in rolling since starting therapy; 6/23: min A to supervision in clinic; not doing at home right; 8/5: Pt's daughter states that his rolling has improved, but still has difficulty with all other bed mobility; 10/23/18: pts daughter states that she is doing about 35% of the work if he is in proper position, he does uses the bed rails to help roll;  using Hoyer for coming to sit EOB; 10/12: pts daughter states that she is doing about 30% of the work if he is in proper position, he does uses the bed rails to help roll, but it is improving;  using Hoyer for coming to sit EOB, 10/29: min A for sit to sidelying, rolling supervision, able to transition sidelying to sit with HHA;    Time 12    Period Weeks    Status Achieved    Target Date 02/02/20      PT LONG TERM GOAL #2   Title Patient will complete sit <> stand transfer chair to chair with Min A and LRAD to improve functional independence for  household and community mobility.     Baseline supervision for STS, still requires elevated surface height to 27"    Time 12    Period Weeks    Status Achieved    Target Date 02/02/20      PT LONG TERM GOAL #3   Title Patient will navigate power w/c with min A x 100 feet to improve mobility for household and short community distances.    Baseline required total A (06/12/2018); able to roll 20 feet with min A (06/20/2018); 07/08/18: Pt can go approximately 10' without assist per family;  10/23/2018: pt's daughter states that his power WC mobility has become 100% better with the new hand control, slowed speed, and with practice; 10/12: pt's daughter reports that he can perform household WC mobility and limited community ambulation with minA    Time 12    Period Weeks    Status Achieved    Target Date 02/02/20      PT LONG TERM GOAL #4   Title Patient will complete car transfers W/C to family SUV with min A using LRAD to improve his abilty to participate in community activities.     Baseline unable to complete car transfers at all (06/12/2018); unable to complete car transfers at all (06/19/2018).; 07/08/18: Unable to perform at this time; 09/11/18: unable to perform at this time; 10/23/2018: unable to complete car transfers at all; 10/12:  unable to complete car transfers at all, 10/29: unable; 01/13/19: unable; 2/11: min-mod A +2, 3/10: min A+1    Time 12    Period Weeks    Status Achieved    Target Date 02/02/20      PT LONG TERM GOAL #5   Title Patient will ambulate at least 10 feet with min A using LRAD to improve mobility for household and community distances.    Baseline unable to take steps (06/12/2018); able to shift R leg forward and back with max A and RW at edge of plinth (06/19/2018); 07/08/18: unable to ambulate at this time. 09/11/18: unable to perform at this time; 10/23/2018:  unable to perform at this time; 10/12: unable to perform at this time, 10/29: unable at this time; 01/13/19: unable at this  time, 2/11: min A to step with RLE, unable to step with LLE, 3/10: able to take 2 steps in parallel bars with min A for safety; 4/1: min A for walking 10 feet in parallel bars, 5/24: min A walking 10 feet in parallel bars; 6/21: no change 7/14: no change    Time 12    Period Weeks    Status Partially Met    Target Date 02/02/20      PT LONG TERM GOAL #6   Title Patient will be able to safety navigate up/down ramp with power chair, exhibiting good safety awareness, mod I to safely enter/exit his home.    Baseline 2/11: supervision with intermittent min A;4/1: supervision; 5/24: supervision    Time 12    Period Weeks    Status Partially Met    Target Date 02/02/20      PT LONG TERM GOAL #7   Title Patient will increase functional reach test to >15 inches in sitting to exhibit improved sitting balance and positioning and reduce fall risk;    Baseline 07/30/18: 12 inches; 09/11/18: 10-12 inches; 10/23/18: 12-13 inch; 10/12: 14.5", 10/29: 15.5 inch    Time 4    Period Weeks    Status Achieved    Target Date 02/02/20      PT LONG TERM GOAL #8   Title Patient will increase LLE quad strength to 3+/5 to improve functional strength for standing and mobility;    Baseline 10/28: 2+/5; 01/13/19: 2+/5, 2/11: 3-/5, 04/16/19: 3-/5, 05/08/19: 3-/5, 5/24: 3-/5, 6/21: 3-/5, 7/14: 3-/5    Time 12    Period Weeks    Status Partially Met    Target Date 02/02/20      PT LONG TERM GOAL  #9   TITLE Patient will report no vertigo with provoking motions or positions in order to be able to perform ADLs and mobility tasks without symptoms.    Baseline 06/30/19: reports no dizziness in last week; 6/21: one episode this weekend; 7/14: experiencing one a week;    Time 12    Period Weeks    Status Partially Met    Target Date 02/02/20                 Plan - 12/29/19 1359    Clinical Impression Statement Patient presents to therapy with increased LUE discomfort. He has a history of cervical pain and neck  impingement which could be contributing to symptoms. PT assessed radial and ulnar nerve compresion with ULNTT which was (+). PT performed passive stretches to left ulnar and radial nerve with increased pain during stretch but significant reduction in pain following stretches. patient was able to exhibit better forward weight shift with sit<>Stand this session. He continues to have posterior lean in standing requiring min A and mod VCs for neutral weight shift. This is exacerbated with LE movement. He reports working on HEP more at home with forward weight shift and LE exercise. He does exhibit better LLE AROM and improved trunk control however this is not always consistent; patient would benefit from additional skilled PT Intervention to improve strength, balance and mobility.    Personal Factors and Comorbidities Age;Comorbidity 3+    Comorbidities Relevant past medical history and comorbidities include long term steroid use for lupus, CKD, chronic osteomyelitis, cervical spine stenosis, BPH, APS, alcohol abuse, peripheral artery disease, depression,  GERD, obstructive sleep apnea, HTN, IBS, IgA deficiency, lumbar radiculopathy, RA, Stroke, toxic maculopathy in both eyes, systemic lupus erythematosus related syndrome, cardiac catheterization, carotid endarterectomy, cervical laminectomy, coronary angioplasty, Fusion C5-C7, knee arthroplasty, lumbar surgery.    Examination-Activity Limitations Bed Mobility;Bend;Sit;Toileting;Stand;Stairs;Lift;Transfers;Squat;Locomotion Level;Carry;Dressing;Hygiene/Grooming;Continence    Examination-Participation Restrictions Yard Work;Interpersonal Relationship;Community Activity    Stability/Clinical Decision Making Evolving/Moderate complexity    Rehab Potential Fair    PT Frequency 3x / week    PT Duration 12 weeks    PT Treatment/Interventions ADLs/Self Care Home Management;Electrical Stimulation;Moist Heat;Cryotherapy;Gait training;Stair training;Functional mobility  training;Therapeutic exercise;Balance training;Neuromuscular re-education;Cognitive remediation;Patient/family education;Orthotic Fit/Training;Wheelchair mobility training;Manual techniques;Passive range of motion;Energy conservation;Joint Manipulations;Canalith Repostioning;Therapeutic activities;Vestibular    PT Next Visit Plan Work on strength, sitting/standing balance, functional activities such as rolling, bed mobility, transfers; caregiver training    PT Home Exercise Plan no updates this session    Consulted and Agree with Plan of Care Patient;Family member/caregiver    Family Member Consulted DTR           Patient will benefit from skilled therapeutic intervention in order to improve the following deficits and impairments:  Abnormal gait, Decreased activity tolerance, Decreased cognition, Decreased endurance, Decreased knowledge of use of DME, Decreased range of motion, Decreased skin integrity, Decreased strength, Impaired perceived functional ability, Impaired sensation, Impaired UE functional use, Improper body mechanics, Pain, Cardiopulmonary status limiting activity, Decreased balance, Decreased coordination, Decreased mobility, Difficulty walking, Impaired tone, Postural dysfunction, Dizziness  Visit Diagnosis: Muscle weakness (generalized)  Other lack of coordination  Difficulty in walking, not elsewhere classified  Repeated falls     Problem List Patient Active Problem List   Diagnosis Date Noted  . Sepsis ([place]) 10/18/2018    Harrell Niehoff PT, DPT 12/29/2019, 2:02 PM  [place] [facility] [address] [place], [place], [address] Phone: [phone]   Fax:  [phone]  Name: Kenneth Summers MRN: [id number] Date of Birth: [birth year]

## 2019-12-30 ENCOUNTER — Encounter: Payer: Self-pay | Admitting: Occupational Therapy

## 2019-12-30 NOTE — Therapy (Signed)
[place] MAIN [facility] SERVICES [address] [place], [place], [address] Phone: [phone]   Fax:  [phone]  Occupational Therapy Treatment  Patient Details  Name: Kenneth Summers MRN: [id number] Date of Birth: 07/20/1945 No data recorded  Encounter Date: 12/29/2019   OT End of Session - 12/29/19 1422    Visit Number [id number]    Number of Visits 157    Date for OT Re-Evaluation 03/01/20    Authorization Type Progress report period starting 07/21/2019    OT Start Time 1348    OT Stop Time 1430    OT Time Calculation (min) 42 min    Activity Tolerance Patient tolerated treatment well    Behavior During Therapy [facility] for tasks assessed/performed           Past Medical History:  Diagnosis Date  . Actinic keratosis   . Alcohol abuse   . APS (antiphospholipid syndrome) ([place])   . Basal cell carcinoma   . BPH (benign prostatic hyperplasia)   . CAD (coronary artery disease)   . Cellulitis   . Cervical spinal stenosis   . Chronic osteomyelitis ([place])   . CKD (chronic kidney disease)   . CKD (chronic kidney disease)   . Clostridium difficile diarrhea   . Depression   . Diplopia   . Fatigue   . GERD (gastroesophageal reflux disease)   . Hyperlipemia   . Hypertension   . Hypovitaminosis D   . IBS (irritable bowel syndrome)   . 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 Insomnia   . Left lumbosacral radiculopathy   . Moderate obstructive sleep apnea   . Osteomyelitis of foot ([place])   . PAD (peripheral artery disease) ([place])   . Pruritus   . RA (rheumatoid arthritis) ([place])   . Radiculopathy   . Restless legs syndrome   . RLS (restless legs syndrome)   . SLE (systemic lupus erythematosus related syndrome) ([place])   . 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 Stroke ([place])   . Toxic maculopathy of both eyes     Past Surgical History:  Procedure Laterality Date  . CARDIAC CATHETERIZATION    . CAROTID ENDARTERECTOMY     . CERVICAL LAMINECTOMY    . CORONARY ANGIOPLASTY    . FRACTURE SURGERY    . fusion C5-6-7    . HERNIA REPAIR    . KNEE ARTHROSCOPY    . TONSILLECTOMY      There were no vitals filed for this visit.   Subjective Assessment - 12/29/19 1420    Subjective  Pt. reports having left hand pain that started at 5: 30 this a.m.    Patient is accompanied by: Family member    Pertinent History Pt. is a 74 y.o. 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 Pt. PMHx includes: Multiple Falls, Lupus, DJD, Renal Abscess, CVA. Pt. resides with his wife. Pt.'s wife and daughter assist with ADLs. Pt. has caregivers in  for 2 hours a day, 6 days a week. Pt. received Rehab services in acute care, at SNF for STR, and [place] services. Pt. is retired from [employer] for [employer] and [employer].    Currently in Pain? Yes    Pain Score 1     Pain Location Hand    Pain Orientation Left    Pain Descriptors / Indicators Aching          Manual  Therapy:  Pt. tolerated retrograde massage, and metacarpal spread stretches in preparation for ROM.   Neuromuscular re-education:  Pt. worked on [facility] skills using the [employer] Task. Pt. worked on sustaining grasp on the resistive tweezers while grasping this sticks, and moving them from a horizontal position to a vertical position to prepare for placing them into the pegboard. Pt. required verbal cues, and cues for visual demonstration for wrist position, and hand pattern when placing them into the pegboard. The task was modified for use without the tweezers.   Pt. reports 1/10 pain in his left hand that started at 5:30 this morning. Pt. responded well to treatment. Pt. had difficulty with maintaining a secure grasp with his right hand on the resistive tweezers with enough force to hold, turn, and place the sticks into the pegboard slots. The task was modified, and the pt. used his 2nd digit, and thumb to grasp the sticks  from a dish, and move them from a horizontal position to a vertical position when placing them into the pegboard. Pt. continues to work on improving UE strength, motor control, and [facility] skills.                     OT Education - 12/29/19 1421    Education Details BUE ther. ex    Person(s) Educated Patient    Methods Explanation;Demonstration    Comprehension Returned demonstration;Tactile cues required;Verbalized understanding;Need further instruction               OT Long Term Goals - 12/08/19 1437      OT LONG TERM GOAL #1   Title Pt. will increase left shoulder flexion AROM by 10 degrees to access cabinet/shelf.    Baseline Pt. continues to progress with left shoulder ROM .Shouler flexion 130, and continues to work on reaching with increasing emphasis on flexing trunk to improve functional reach.  07/21/19- shoulder flexion to 130 with effort    Time 12    Period Weeks    Status Not Met    Target Date 03/01/20      OT LONG TERM GOAL #2   Title Pt. will donn a shirt with Supervision.    Baseline Pt. continues to require MinA donning a zip down jacket with consistent cues to initiate the correct direction of the jacket.  MinA for donning a T-shirt with assist to roll side to side to pull the shirt down over his back. 07/21/19 continues to require min assist at times for shirt/jacket    Time 12    Period Weeks    Status On-going    Target Date 03/01/20      OT LONG TERM GOAL #3   Title Pt. will require ModA to perform LE dressing    Baseline MaxA left, ModA with the right. Pt. has sores on his ankle, and feet. Pt.'s daughter is assisting.    Time 12    Period Weeks    Status Deferred    Target Date 03/02/19      OT LONG TERM GOAL #4   Title Pt. will improve left grip strength by 10# to be able to open a jar/container.    Baseline Pt. continues to present with limited left hand grip strength. Pt. continues to work towards opening containers, and bottles.     Time 12    Period Weeks    Status On-going    Target Date 03/02/19      OT LONG TERM GOAL #  5   Title Pt. will improve left hand [facility] skills to be able to assist with buttoning/zipping.    Baseline Pt. has improved overall donning a jacket, and  manipulating the zipper on his jacket, however continues to require practice. Pt. continues to work on improving bilateral [facility] skills for buttoning/zipping.  07/21/19 improving with buttons and zippers.    Time 12    Period Weeks    Status On-going    Target Date 03/01/20      OT LONG TERM GOAL #6   Title Pt. will demonstrate visual conmpensatory strategies for 100% of the time during ADLs    Baseline Pt.continues to require intermittent cues cues for left sides awareness.    Time 12    Period Weeks    Status Partially Met    Target Date 03/01/20      OT LONG TERM GOAL #7   Title Pt. will prepare a simple cold snack from w/c with supervision using cognitive compensatory strategies 100% of the time.    Baseline Pt. has difficulty with getting the w/c close enough to access the refrigerator    Time 12    Period Weeks    Status Deferred      OT LONG TERM GOAL #8   Title Pt. will accurately identify potential safety hazard using good safety awareness, and judgement 100% for ADLs, and IADLs.    Baseline Pt. continues to require cues    Time 12    Period Weeks    Status On-going    Target Date 03/01/20      OT LONG TERM GOAL  #9   TITLE Pt. will  navigate his w/c around obstacles with Supervision and 100% accuracy    Baseline Pt. continues to require cuing for obstacles on the left, however requires less cues overall. Pt. requires significant cues to engage his left UE during tasks. 07/21/2019 continues to improve with decreased assist at times    Time 12    Period Weeks    Status On-going    Target Date 03/01/20      OT LONG TERM GOAL  #10   TITLE Pt. will independently be able  to reach to place items into cabinetry, and closets.     Baseline Pt. BUE strength is improving. UE functional reaching conitnues to be limited.    Time 12    Period Weeks    Status On-going    Target Date 03/01/20      OT LONG TERM GOAL  #11   TITLE Pt. will increase BUE strength by 93m grades in preparation for ADL transfers.    Baseline Pt. in improveing BUE strength, and  is able to tolertae increased resistive weights. Pt. continues to work towards improving overall strength.    Time 12    Period Weeks    Status On-going    Target Date 03/01/20      OT LONG TERM GOAL  #12   TITLE Pt. will improve LUE functional reaching with the left engaging the trunk 100% of the time during ADL tasks with minimal cues.    Baseline Pt. conitnues to improve with LUE functional reaching. Fewer cues are required to engage his trunk for extended reaching.    Time 12    Period Weeks    Status On-going    Target Date 03/01/20                 Plan - 12/30/19 0854    Clinical  Impression Statement Pt. reports 1/10 pain in his left hand that started at 5:30 this morning. Pt. responded well to treatment. Pt. had difficulty with maintaining a secure grasp with his right hand on the resistive tweezers with enough force to hold, turn, and place the sticks into the pegboard slots. The task was modified, and the pt. used his 2nd digit, and thumb to grasp the sticks from a dish, and move them from a horizontal position to a vertical position when placing them into the pegboard. Pt. continues to work on improving UE strength, motor control, and [facility] skills.   OT Occupational Profile and History Problem Focused Assessment - Including review of records relating to presenting problem    Occupational performance deficits (Please refer to evaluation for details): ADL's    Body Structure / Function / Physical Skills UE functional use;Coordination;FMC;Dexterity;Strength;ROM    Cognitive Skills Attention;Memory;Emotional;Problem Solve;Safety Awareness    Rehab Potential  Good    Clinical Decision Making Several treatment options, min-mod task modification necessary    Comorbidities Affecting Occupational Performance: Presence of comorbidities impacting occupational performance    Comorbidities impacting occupational performance description: Phyical, cognitive, visual,  medical comorbidities    Modification or Assistance to Complete Evaluation  No modification of tasks or assist necessary to complete eval    OT Frequency 2x / week    OT Duration 12 weeks    OT Treatment/Interventions Self-care/ADL training;DME and/or AE instruction;Therapeutic exercise;Therapeutic activities;Moist Heat;Cognitive remediation/compensation;Neuromuscular education;Visual/perceptual remediation/compensation;Coping strategies training;Patient/family education;Passive range of motion;Psychosocial skills training;Energy conservation;Functional Mobility Training    Consulted and Agree with Plan of Care Patient;Family member/caregiver           Patient will benefit from skilled therapeutic intervention in order to improve the following deficits and impairments:   Body Structure / Function / Physical Skills: UE functional use, Coordination, FMC, Dexterity, Strength, ROM Cognitive Skills: Attention, Memory, Emotional, Problem Solve, Safety Awareness     Visit Diagnosis: Muscle weakness (generalized)  Other lack of coordination    Problem List Patient Active Problem List   Diagnosis Date Noted  . Sepsis ([place]) 10/18/2018    Harrel Carina, MS, OTR/L 12/30/2019, 10:10 AM  [place] MAIN [facility] SERVICES [address] [place], [place], [address] Phone: [phone]   Fax:  [phone]  Name: Kenneth Summers MRN: [id number] Date of Birth: 02-06-1946

## 2019-12-31 ENCOUNTER — Ambulatory Visit: Payer: Medicare Other | Admitting: Physical Therapy

## 2020-01-05 ENCOUNTER — Other Ambulatory Visit: Payer: Self-pay

## 2020-01-05 ENCOUNTER — Ambulatory Visit: Payer: Medicare Other | Admitting: Occupational Therapy

## 2020-01-05 ENCOUNTER — Encounter: Payer: Self-pay | Admitting: Occupational Therapy

## 2020-01-05 ENCOUNTER — Encounter: Payer: Self-pay | Admitting: Physical Therapy

## 2020-01-05 ENCOUNTER — Ambulatory Visit: Payer: Medicare Other | Admitting: Physical Therapy

## 2020-01-05 DIAGNOSIS — R296 Repeated falls: Secondary | ICD-10-CM

## 2020-01-05 DIAGNOSIS — M6281 Muscle weakness (generalized): Secondary | ICD-10-CM

## 2020-01-05 DIAGNOSIS — R262 Difficulty in walking, not elsewhere classified: Secondary | ICD-10-CM

## 2020-01-05 DIAGNOSIS — R278 Other lack of coordination: Secondary | ICD-10-CM

## 2020-01-05 NOTE — Therapy (Signed)
[place] MAIN [facility] SERVICES [address] [place], [place], [address] Phone: [phone]   Fax:  [phone]  Physical Therapy Treatment  Patient Details  Name: Kenneth Summers MRN: [id number] Date of Birth: [birth year] No data recorded  Encounter Date: 01/05/2020   PT End of Session - 01/05/20 1257    Visit Number [id number]    Number of Visits 252    Date for PT Re-Evaluation 02/02/20    PT Start Time 1300    PT Stop Time 1345    PT Time Calculation (min) 45 min    Equipment Utilized During Treatment Gait belt    Activity Tolerance Patient tolerated treatment well;No increased pain    Behavior During Therapy WFL for tasks assessed/performed           Past Medical History:  Diagnosis Date  . Actinic keratosis   . Alcohol abuse   . APS (antiphospholipid syndrome) ([place])   . Basal cell carcinoma   . BPH (benign prostatic hyperplasia)   . CAD (coronary artery disease)   . Cellulitis   . Cervical spinal stenosis   . Chronic osteomyelitis ([place])   . CKD (chronic kidney disease)   . CKD (chronic kidney disease)   . Clostridium difficile diarrhea   . Depression   . Diplopia   . Fatigue   . GERD (gastroesophageal reflux disease)   . Hyperlipemia   . Hypertension   . Hypovitaminosis D   . IBS (irritable bowel syndrome)   . 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 Insomnia   . Left lumbosacral radiculopathy   . Moderate obstructive sleep apnea   . Osteomyelitis of foot ([place])   . PAD (peripheral artery disease) ([place])   . Pruritus   . RA (rheumatoid arthritis) ([place])   . Radiculopathy   . Restless legs syndrome   . RLS (restless legs syndrome)   . SLE (systemic lupus erythematosus related syndrome) ([place])   . 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 Stroke ([place])   . Toxic maculopathy of both eyes     Past Surgical History:  Procedure Laterality Date  . CARDIAC CATHETERIZATION    . CAROTID ENDARTERECTOMY    .  CERVICAL LAMINECTOMY    . CORONARY ANGIOPLASTY    . FRACTURE SURGERY    . fusion C5-6-7    . HERNIA REPAIR    . KNEE ARTHROSCOPY    . TONSILLECTOMY      There were no vitals filed for this visit.   Subjective Assessment - 01/05/20 1307    Subjective Patient reports experiencing ringing in his ears and some dizziness with room spinning that was yesterday. Denies any dizziness today. Denies any soreness currently; Reports working on LE exercise last week with slight improvement in hip adduction;    Pertinent History Patient is a 74 y.o. male who presents to outpatient physical therapy with a referral for medical diagnosis of CVA. This patient's chief complaints consist of left hemiplegia and overall deconditioning leading to the following functional deficits: dependent for ADLs, IADLs, unable to transfer to car, dependent transfers at home with hoyer lift, unable to walk or stand without significant assistance, difficulty with W/C navigation..    Diagnostic tests Brain MRI 11/01/2017: IMPRESSION: 1. Acute right ACA territory infarct as demonstrated on prior CT imaging. No intracranial hemorrhage or significant mass effect. 2. 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    Patient  Stated Goals wants to be able to walk again    Currently in Pain? No/denies    Pain Onset Today    Multiple Pain Sites No                TREATMENT: Prior to transfersand in between transfers: Patient sitting edge of chair, working on sitting posture while removing orange coat x2-3 min with increased posterior/left side lean;  LLE LAQ AROM with visual cues to increase ROM x10 reps; Required min VCs and visual cues for better AROM to increase strengthening;  Following exercise: Transferred sit<>Stand in parallel bars (seat adjusted to27inch from floor height) X1 repswith CGA;herequiredmodVCs for forward weight shift for better trunk control and transfer  ability; Patientexhibits increased posterior lean this session requiring increased cues for forward weight shift for better neutral weight shift;  Patient required min Ato CGA for standing posture. Heexhibits increased posterior lean; Patient appears more fatigued today;  While standing: -mini squatx15reps with CGA for safety;Initially patient exhibits increased posterior lean with increased repetitions; Patient was able to correct postural control requiring close supervision for safety;   Patient requiredmodA to scoot back in chair at end of session due to increased fatigue  Patient had a harder time with forward weight shift this session. He reports doing some LE exercise at home but admits he does not feel comfortable working on standing at home. Patient has been having a harder time with bed mobility and sitting balance at home. Recommend therapy treatment shift towards more sitting balance and bed mobility to increase independence at home. Patient and caregiver agreeable.                      PT Education - 01/05/20 1257    Education Details transfers/positioning, LE strengthening, HEP    Person(s) Educated Patient    Methods Explanation;Verbal cues    Comprehension Returned demonstration;Verbal cues required;Verbalized understanding;Need further instruction            PT Short Term Goals - 08/18/19 1305      PT SHORT TERM GOAL #1   Title Be independent with initial home exercise program for self-management of symptoms.    Baseline HEP to be given at second session; advice to practice unsupported sitting at home (06/19/2018); 07/08/18: Performing at home, 6/29: needs assistance but is doing exercise program regularly; 08/22/18: adherent;    Time 2    Period Weeks    Status Achieved    Target Date 06/26/18             PT Long Term Goals - 11/10/19 1323      PT LONG TERM GOAL #1   Title Patient will complete all bed mobility with min A to  improve functional independence for getting in and out of bed and adjusting in bed.     Baseline max A - total A reported by family (06/19/2018); 07/08/18: still requires maxA, dtr reports improvement in rolling since starting therapy; 6/23: min A to supervision in clinic; not doing at home right; 8/5: Pt's daughter states that his rolling has improved, but still has difficulty with all other bed mobility; 10/23/18: pts daughter states that she is doing about 35% of the work if he is in proper position, he does uses the bed rails to help roll;  using Harrel Lemon for coming to sit EOB; 10/12: pts daughter states that she is doing about 30% of the work if he is in proper position, he does uses the  bed rails to help roll, but it is improving;  using Hoyer for coming to sit EOB, 10/29: min A for sit to sidelying, rolling supervision, able to transition sidelying to sit with HHA;    Time 12    Period Weeks    Status Achieved    Target Date 02/02/20      PT LONG TERM GOAL #2   Title Patient will complete sit <> stand transfer chair to chair with Min A and LRAD to improve functional independence for household and community mobility.     Baseline supervision for STS, still requires elevated surface height to 27"    Time 12    Period Weeks    Status Achieved    Target Date 02/02/20      PT LONG TERM GOAL #3   Title Patient will navigate power w/c with min A x 100 feet to improve mobility for household and short community distances.    Baseline required total A (06/12/2018); able to roll 20 feet with min A (06/20/2018); 07/08/18: Pt can go approximately 10' without assist per family;  10/23/2018: pt's daughter states that his power WC mobility has become 100% better with the new hand control, slowed speed, and with practice; 10/12: pt's daughter reports that he can perform household WC mobility and limited community ambulation with minA    Time 12    Period Weeks    Status Achieved    Target Date 02/02/20      PT LONG  TERM GOAL #4   Title Patient will complete car transfers W/C to family SUV with min A using LRAD to improve his abilty to participate in community activities.     Baseline unable to complete car transfers at all (06/12/2018); unable to complete car transfers at all (06/19/2018).; 07/08/18: Unable to perform at this time; 09/11/18: unable to perform at this time; 10/23/2018: unable to complete car transfers at all; 10/12:  unable to complete car transfers at all, 10/29: unable; 01/13/19: unable; 2/11: min-mod A +2, 3/10: min A+1    Time 12    Period Weeks    Status Achieved    Target Date 02/02/20      PT LONG TERM GOAL #5   Title Patient will ambulate at least 10 feet with min A using LRAD to improve mobility for household and community distances.    Baseline unable to take steps (06/12/2018); able to shift R leg forward and back with max A and RW at edge of plinth (06/19/2018); 07/08/18: unable to ambulate at this time. 09/11/18: unable to perform at this time; 10/23/2018: unable to perform at this time; 10/12: unable to perform at this time, 10/29: unable at this time; 01/13/19: unable at this time, 2/11: min A to step with RLE, unable to step with LLE, 3/10: able to take 2 steps in parallel bars with min A for safety; 4/1: min A for walking 10 feet in parallel bars, 5/24: min A walking 10 feet in parallel bars; 6/21: no change 7/14: no change    Time 12    Period Weeks    Status Partially Met    Target Date 02/02/20      PT LONG TERM GOAL #6   Title Patient will be able to safety navigate up/down ramp with power chair, exhibiting good safety awareness, mod I to safely enter/exit his home.    Baseline 2/11: supervision with intermittent min A;4/1: supervision; 5/24: supervision    Time 12    Period  Weeks    Status Partially Met    Target Date 02/02/20      PT LONG TERM GOAL #7   Title Patient will increase functional reach test to >15 inches in sitting to exhibit improved sitting balance and positioning and  reduce fall risk;    Baseline 07/30/18: 12 inches; 09/11/18: 10-12 inches; 10/23/18: 12-13 inch; 10/12: 14.5", 10/29: 15.5 inch    Time 4    Period Weeks    Status Achieved    Target Date 02/02/20      PT LONG TERM GOAL #8   Title Patient will increase LLE quad strength to 3+/5 to improve functional strength for standing and mobility;    Baseline 10/28: 2+/5; 01/13/19: 2+/5, 2/11: 3-/5, 04/16/19: 3-/5, 05/08/19: 3-/5, 5/24: 3-/5, 6/21: 3-/5, 7/14: 3-/5    Time 12    Period Weeks    Status Partially Met    Target Date 02/02/20      PT LONG TERM GOAL  #9   TITLE Patient will report no vertigo with provoking motions or positions in order to be able to perform ADLs and mobility tasks without symptoms.    Baseline 06/30/19: reports no dizziness in last week; 6/21: one episode this weekend; 7/14: experiencing one a week;    Time 12    Period Weeks    Status Partially Met    Target Date 02/02/20                 Plan - 01/06/20 1211    Clinical Impression Statement Patient presents to therapy motivated and tolerated session well. Patient was instructed in sit<>stand transfers. He had increased difficulty initiating forward weight shift requiring increased time for optimal positioning prior to standing. patient fatigues quickly with standing being able to stand for only short time. patient continues to have difficulty with sit<>stand transfers. This is compounded by decreased attendance in therapy from transportation/caregiver issues as well as not feeling comfortable working on transfers at home. Recommend therapy shift to work more on sitting balance and bed mobility which would increase patient independence at home and increase functional mobility. Patient would also feel more comfortable working in a seated/lying position which will increase HEP adherence. Patient would benefit from additional skilled PT intervention to improve strength and mobility;    Personal Factors and Comorbidities  Age;Comorbidity 3+    Comorbidities Relevant past medical history and comorbidities include long term steroid use for lupus, CKD, chronic osteomyelitis, cervical spine stenosis, BPH, APS, alcohol abuse, peripheral artery disease, depression, GERD, obstructive sleep apnea, HTN, IBS, IgA deficiency, lumbar radiculopathy, RA, Stroke, toxic maculopathy in both eyes, systemic lupus erythematosus related syndrome, cardiac catheterization, carotid endarterectomy, cervical laminectomy, coronary angioplasty, Fusion C5-C7, knee arthroplasty, lumbar surgery.    Examination-Activity Limitations Bed Mobility;Bend;Sit;Toileting;Stand;Stairs;Lift;Transfers;Squat;Locomotion Level;Carry;Dressing;Hygiene/Grooming;Continence    Examination-Participation Restrictions Yard Work;Interpersonal Relationship;Community Activity    Stability/Clinical Decision Making Evolving/Moderate complexity    Rehab Potential Fair    PT Frequency 3x / week    PT Duration 12 weeks    PT Treatment/Interventions ADLs/Self Care Home Management;Electrical Stimulation;Moist Heat;Cryotherapy;Gait training;Stair training;Functional mobility training;Therapeutic exercise;Balance training;Neuromuscular re-education;Cognitive remediation;Patient/family education;Orthotic Fit/Training;Wheelchair mobility training;Manual techniques;Passive range of motion;Energy conservation;Joint Manipulations;Canalith Repostioning;Therapeutic activities;Vestibular    PT Next Visit Plan Work on strength, sitting/standing balance, functional activities such as rolling, bed mobility, transfers; caregiver training    PT Home Exercise Plan no updates this session    Consulted and Agree with Plan of Care Patient;Family member/caregiver    Family Member Consulted DTR  Patient will benefit from skilled therapeutic intervention in order to improve the following deficits and impairments:  Abnormal gait, Decreased activity tolerance, Decreased cognition, Decreased  endurance, Decreased knowledge of use of DME, Decreased range of motion, Decreased skin integrity, Decreased strength, Impaired perceived functional ability, Impaired sensation, Impaired UE functional use, Improper body mechanics, Pain, Cardiopulmonary status limiting activity, Decreased balance, Decreased coordination, Decreased mobility, Difficulty walking, Impaired tone, Postural dysfunction, Dizziness  Visit Diagnosis: Muscle weakness (generalized)  Other lack of coordination  Difficulty in walking, not elsewhere classified  Repeated falls     Problem List Patient Active Problem List   Diagnosis Date Noted  . Sepsis ([place]) 10/18/2018    Jester Klingberg PT, DPT 01/06/2020, 12:47 PM  [place] MAIN [facility] SERVICES [address] [place], [place], [address] Phone: [phone]   Fax:  [phone]  Name: Kenneth Summers MRN: [id number] Date of Birth: 09-23-1945

## 2020-01-05 NOTE — Therapy (Signed)
[place] [facility] MAIN [facility] SERVICES [address] [place], [place], [address] Phone: [phone]   Fax:  [phone]  Occupational Therapy Treatment  Patient Details  Name: Kenneth Summers MRN: [id number] Date of Birth: [birth year] No data recorded  Encounter Date: 01/05/2020   OT End of Session - 01/05/20 1359    Visit Number 155    Number of Visits 181    Date for OT Re-Evaluation 03/01/20    Authorization Type Progress report period starting 07/21/2019    OT Start Time 1352    OT Stop Time 1430    OT Time Calculation (min) 38 min    Activity Tolerance Patient tolerated treatment well    Behavior During Therapy [facility] for tasks assessed/performed           Past Medical History:  Diagnosis Date  . Actinic keratosis   . Alcohol abuse   . APS (antiphospholipid syndrome) (HCC)   . Basal cell carcinoma   . BPH (benign prostatic hyperplasia)   . CAD (coronary artery disease)   . Cellulitis   . Cervical spinal stenosis   . Chronic osteomyelitis (HCC)   . CKD (chronic kidney disease)   . CKD (chronic kidney disease)   . Clostridium difficile diarrhea   . Depression   . Diplopia   . Fatigue   . GERD (gastroesophageal reflux disease)   . Hyperlipemia   . Hypertension   . Hypovitaminosis D   . IBS (irritable bowel syndrome)   . IgA deficiency (HCC)   . Insomnia   . Left lumbosacral radiculopathy   . Moderate obstructive sleep apnea   . Osteomyelitis of foot (HCC)   . PAD (peripheral artery disease) (HCC)   . Pruritus   . RA (rheumatoid arthritis) (HCC)   . Radiculopathy   . Restless legs syndrome   . RLS (restless legs syndrome)   . SLE (systemic lupus erythematosus related syndrome) (HCC)   . 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 Stroke (HCC)   . Toxic maculopathy of both eyes     Past Surgical History:  Procedure Laterality Date  . CARDIAC CATHETERIZATION    . CAROTID ENDARTERECTOMY     . CERVICAL LAMINECTOMY    . CORONARY ANGIOPLASTY    . FRACTURE SURGERY    . fusion C5-6-7    . HERNIA REPAIR    . KNEE ARTHROSCOPY    . TONSILLECTOMY      There were no vitals filed for this visit.   Subjective Assessment - 01/05/20 1350    Subjective  Pt. reports that he is not walking at home because has a fear of falling.    Patient is accompanied by: Family member    Pertinent History Pt. is a 74 y.o. 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 Pt. PMHx includes: Multiple Falls, Lupus, DJD, Renal Abscess, CVA. Pt. resides with his wife. Pt.'s wife and daughter assist with ADLs. Pt. has caregivers in  for 2 hours a day, 6 days a week. Pt. received Rehab services in acute care, at SNF for STR, and HHOT services. Pt. is retired from [employer] for [employer] and [employer].    Currently in Pain? No/denies          OT TREATMENT   Therapeutic Exercise:  Pt. performed 3#dowelfor UE strengthening secondary to weakness.Pt.tolerated increased dowel weight.Bilateral shoulder flexion, chest press,andcircular patterns were performedfor2 sets  20 reps each.  Pt. worked on 3# dumbbell ex. for bilateral elbow flexion and extension,3# for right, 2# Left for forearm supination/pronation, wrist flexion/extension 1 set of 10- 20 and radial deviation 1 set 20 reps.  Pt.continuestomake progress with BUE strength, and tolerance for there. Ex.Pt.continues to present with weakness, as well asimpaired motor control, and motor planning.Pt. tolerated BUE exercises well, with no reportsofdiscomfort. Pt. required consistent verbal, visual, and tactile cues. Pt. continues torequire less cuesto perform UE strengthening exercises, technique, and formas well as visual demonstration prior to the exercises.Pt. continues to work on improving BUE strength, and [facility] skills in order to improve LUE functioning during ADLs, IADLs, and tranfers.                           OT Education - 01/05/20 1359    Education Details BUE ther. ex    Person(s) Educated Patient    Methods Explanation;Demonstration    Comprehension Returned demonstration;Tactile cues required;Verbalized understanding;Need further instruction               OT Long Term Goals - 12/08/19 1437      OT LONG TERM GOAL #1   Title Pt. will increase left shoulder flexion AROM by 10 degrees to access cabinet/shelf.    Baseline Pt. continues to progress with left shoulder ROM .Shouler flexion 130, and continues to work on reaching with increasing emphasis on flexing trunk to improve functional reach.  07/21/19- shoulder flexion to 130 with effort    Time 12    Period Weeks    Status Not Met    Target Date 03/01/20      OT LONG TERM GOAL #2   Title Pt. will donn a shirt with Supervision.    Baseline Pt. continues to require MinA donning a zip down jacket with consistent cues to initiate the correct direction of the jacket.  MinA for donning a T-shirt with assist to roll side to side to pull the shirt down over his back. 07/21/19 continues to require min assist at times for shirt/jacket    Time 12    Period Weeks    Status On-going    Target Date 03/01/20      OT LONG TERM GOAL #3   Title Pt. will require ModA to perform LE dressing    Baseline MaxA left, ModA with the right. Pt. has sores on his ankle, and feet. Pt.'s daughter is assisting.    Time 12    Period Weeks    Status Deferred    Target Date 03/02/19      OT LONG TERM GOAL #4   Title Pt. will improve left grip strength by 10# to be able to open a jar/container.    Baseline Pt. continues to present with limited left hand grip strength. Pt. continues to work towards opening containers, and bottles.    Time 12    Period Weeks    Status On-going    Target Date 03/02/19      OT LONG TERM GOAL #5   Title Pt. will improve left hand [facility] skills to be able to assist with  buttoning/zipping.    Baseline Pt. has improved overall donning a jacket, and  manipulating the zipper on his jacket, however continues to require practice. Pt. continues to work on improving bilateral [facility] skills for buttoning/zipping.  07/21/19 improving with buttons and zippers.    Time 12    Period Weeks  Status On-going    Target Date 03/01/20      OT LONG TERM GOAL #6   Title Pt. will demonstrate visual conmpensatory strategies for 100% of the time during ADLs    Baseline Pt.continues to require intermittent cues cues for left sides awareness.    Time 12    Period Weeks    Status Partially Met    Target Date 03/01/20      OT LONG TERM GOAL #7   Title Pt. will prepare a simple cold snack from w/c with supervision using cognitive compensatory strategies 100% of the time.    Baseline Pt. has difficulty with getting the w/c close enough to access the refrigerator    Time 12    Period Weeks    Status Deferred      OT LONG TERM GOAL #8   Title Pt. will accurately identify potential safety hazard using good safety awareness, and judgement 100% for ADLs, and IADLs.    Baseline Pt. continues to require cues    Time 12    Period Weeks    Status On-going    Target Date 03/01/20      OT LONG TERM GOAL  #9   TITLE Pt. will  navigate his w/c around obstacles with Supervision and 100% accuracy    Baseline Pt. continues to require cuing for obstacles on the left, however requires less cues overall. Pt. requires significant cues to engage his left UE during tasks. 07/21/2019 continues to improve with decreased assist at times    Time 12    Period Weeks    Status On-going    Target Date 03/01/20      OT LONG TERM GOAL  #10   TITLE Pt. will independently be able  to reach to place items into cabinetry, and closets.    Baseline Pt. BUE strength is improving. UE functional reaching conitnues to be limited.    Time 12    Period Weeks    Status On-going    Target Date 03/01/20      OT  LONG TERM GOAL  #11   TITLE Pt. will increase BUE strength by 64mm grades in preparation for ADL transfers.    Baseline Pt. in improveing BUE strength, and  is able to tolertae increased resistive weights. Pt. continues to work towards improving overall strength.    Time 12    Period Weeks    Status On-going    Target Date 03/01/20      OT LONG TERM GOAL  #12   TITLE Pt. will improve LUE functional reaching with the left engaging the trunk 100% of the time during ADL tasks with minimal cues.    Baseline Pt. conitnues to improve with LUE functional reaching. Fewer cues are required to engage his trunk for extended reaching.    Time 12    Period Weeks    Status On-going    Target Date 03/01/20                 Plan - 01/05/20 1402    Clinical Impression Statement Pt.continuestomake progress with BUE strength, and tolerance for there. Ex.Pt.continues to present with weakness, as well asimpaired motor control, and motor planning.Pt. tolerated BUE exercises well, with no reportsofdiscomfort. Pt. required consistent verbal, visual, and tactile cues. Pt. continues torequire less cuesto perform UE strengthening exercises, technique, and formas well as visual demonstration prior to the exercises.Pt. continues to work on improving BUE strength, and [facility] skills in order to improve  LUE functioning during ADLs, IADLs, and transfers.    OT Occupational Profile and History Problem Focused Assessment - Including review of records relating to presenting problem    Occupational performance deficits (Please refer to evaluation for details): ADL's    Body Structure / Function / Physical Skills UE functional use;Coordination;FMC;Dexterity;Strength;ROM    Cognitive Skills Attention;Memory;Emotional;Problem Solve;Safety Awareness    Rehab Potential Good    Clinical Decision Making Several treatment options, min-mod task modification necessary    Comorbidities Affecting Occupational Performance:  Presence of comorbidities impacting occupational performance    OT Frequency 2x / week    OT Duration 12 weeks    OT Treatment/Interventions Self-care/ADL training;DME and/or AE instruction;Therapeutic exercise;Therapeutic activities;Moist Heat;Cognitive remediation/compensation;Neuromuscular education;Visual/perceptual remediation/compensation;Coping strategies training;Patient/family education;Passive range of motion;Psychosocial skills training;Energy conservation;Functional Mobility Training    Consulted and Agree with Plan of Care Patient;Family member/caregiver    Family Member Consulted daughter [name]           Patient will benefit from skilled therapeutic intervention in order to improve the following deficits and impairments:   Body Structure / Function / Physical Skills: UE functional use, Coordination, FMC, Dexterity, Strength, ROM Cognitive Skills: Attention, Memory, Emotional, Problem Solve, Safety Awareness     Visit Diagnosis: Muscle weakness (generalized)    Problem List Patient Active Problem List   Diagnosis Date Noted  . Sepsis ([place]) 10/18/2018    Harrel Carina, MS, OTR/L 01/05/2020, 2:04 PM  [place] MAIN [facility] SERVICES [address] [place], [place], [address] Phone: [phone]   Fax:  [phone]  Name: Kenneth Summers MRN: [id number] Date of Birth: [birth year]

## 2020-01-07 ENCOUNTER — Encounter: Payer: Self-pay | Admitting: Physical Therapy

## 2020-01-07 ENCOUNTER — Ambulatory Visit: Payer: Medicare Other | Attending: Family Medicine | Admitting: Physical Therapy

## 2020-01-07 ENCOUNTER — Other Ambulatory Visit: Payer: Self-pay

## 2020-01-07 DIAGNOSIS — R262 Difficulty in walking, not elsewhere classified: Secondary | ICD-10-CM | POA: Diagnosis present

## 2020-01-07 DIAGNOSIS — R296 Repeated falls: Secondary | ICD-10-CM

## 2020-01-07 DIAGNOSIS — R278 Other lack of coordination: Secondary | ICD-10-CM

## 2020-01-07 DIAGNOSIS — M6281 Muscle weakness (generalized): Secondary | ICD-10-CM

## 2020-01-07 DIAGNOSIS — I69354 Hemiplegia and hemiparesis following cerebral infarction affecting left non-dominant side: Secondary | ICD-10-CM | POA: Diagnosis present

## 2020-01-07 NOTE — Therapy (Signed)
[name] [facility] SERVICES [address] [place], [place], [address] Phone: [phone]   Fax:  [phone]  Physical Therapy Treatment  Patient Details  Name: Kenneth Summers MRN: [id number] Date of Birth: Nov 30, 1945 No data recorded  Encounter Date: 01/07/2020   PT End of Session - 01/07/20 1440    Visit Number [id number]    Number of Visits 252    Date for PT Re-Evaluation 02/02/20    PT Start Time 1301    PT Stop Time 1345    PT Time Calculation (min) 44 min    Equipment Utilized During Treatment Gait belt    Activity Tolerance Patient tolerated treatment well;No increased pain    Behavior During Therapy WFL for tasks assessed/performed           Past Medical History:  Diagnosis Date  . Actinic keratosis   . Alcohol abuse   . APS (antiphospholipid syndrome) ([place])   . Basal cell carcinoma   . BPH (benign prostatic hyperplasia)   . CAD (coronary artery disease)   . Cellulitis   . Cervical spinal stenosis   . Chronic osteomyelitis ([place])   . CKD (chronic kidney disease)   . CKD (chronic kidney disease)   . Clostridium difficile diarrhea   . Depression   . Diplopia   . Fatigue   . GERD (gastroesophageal reflux disease)   . Hyperlipemia   . Hypertension   . Hypovitaminosis D   . IBS (irritable bowel syndrome)   . 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 Insomnia   . Left lumbosacral radiculopathy   . Moderate obstructive sleep apnea   . Osteomyelitis of foot ([place])   . PAD (peripheral artery disease) ([place])   . Pruritus   . RA (rheumatoid arthritis) ([place])   . Radiculopathy   . Restless legs syndrome   . RLS (restless legs syndrome)   . SLE (systemic lupus erythematosus related syndrome) ([place])   . 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 Stroke ([place])   . Toxic maculopathy of both eyes     Past Surgical History:  Procedure Laterality Date  . CARDIAC CATHETERIZATION    . CAROTID ENDARTERECTOMY    .  CERVICAL LAMINECTOMY    . CORONARY ANGIOPLASTY    . FRACTURE SURGERY    . fusion C5-6-7    . HERNIA REPAIR    . KNEE ARTHROSCOPY    . TONSILLECTOMY      There were no vitals filed for this visit.   Subjective Assessment - 01/07/20 1304    Subjective Patient reports doing well; no pain or soreness today; He presents today with heavy left lateral lean;    Pertinent History Patient is a 74 y.o. male who presents to outpatient physical therapy with a referral for medical diagnosis of CVA. This patient's chief complaints consist of left hemiplegia and overall deconditioning leading to the following functional deficits: dependent for ADLs, IADLs, unable to transfer to car, dependent transfers at home with hoyer lift, unable to walk or stand without significant assistance, difficulty with W/C navigation..    Diagnostic tests Brain MRI 11/01/2017: IMPRESSION: 1. Acute right ACA territory infarct as demonstrated on prior CT imaging. No intracranial hemorrhage or significant mass effect. 2. 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    Patient Stated Goals wants to be able to walk again    Currently in Pain? No/denies    Pain  Onset Today               TREATMENT: Patient transferred squat pivot wheelchair to mat table, max A +3 with cues for hand placement and trunk position. Patient exhibits heavy posterior left lateral lean requiring increased assistance for safety;  He required mod A +2 for transition sit to supine;   Patient was dependent for scooting up in bed;   Patient hooklying: Lumbar trunk rotation x5 reps each direction Hip flexion march x10 reps each LE with visual cues to increase ROM, AAROM on LLE; Bridges with arms by side 3 sec hold x10 reps with cues for increased ROM and increased hold time with gluteal squeeze  Sidelying: Hip abduction clamshells green tband 2x15 on RLE, x15 reps on LLE with AAROM; Patient had difficulty initiating LLE  clamshell ROM due to weakness in gluteals;   Patient required mod A +2 for transition sidelying to sitting with cues for hand placement and trunk control;   Upon sitting, instructed patient in neutral weight shift: Sitting unsupported with feet supported and mirror for visual cues for neutral weight shift:  -forward reach with LUE towards mirror and then erect sitting x10 reps; -leaning to right side on RUE bending elbow and then pushing to neutral sitting x10 reps; Patient required min A throughout all sitting for better postural control and weight shift;     Patient transferred squat pivot mat table to wheelchair with max A +3 with cues for hand placement and positioning.  Patient tolerated session well. He reports minimal fatigue at end of session although did get fatigued with some exercise requiring short rest break. He was able to exhibit better trunk control at end of session being able to achieve sitting edge of mat table with close supervision without loss of balance. Patient would benefit from additional skilled PT intervention to improve trunk control and LE strength for increased mobility;                      PT Education - 01/07/20 1439    Education Details LE strengthening, trunk control/positioning, HEP    Person(s) Educated Patient    Methods Explanation;Verbal cues    Comprehension Verbalized understanding;Returned demonstration;Verbal cues required;Need further instruction            PT Short Term Goals - 08/18/19 1305      PT SHORT TERM GOAL #1   Title Be independent with initial home exercise program for self-management of symptoms.    Baseline HEP to be given at second session; advice to practice unsupported sitting at home (06/19/2018); 07/08/18: Performing at home, 6/29: needs assistance but is doing exercise program regularly; 08/22/18: adherent;    Time 2    Period Weeks    Status Achieved    Target Date 06/26/18             PT Long Term  Goals - 11/10/19 1323      PT LONG TERM GOAL #1   Title Patient will complete all bed mobility with min A to improve functional independence for getting in and out of bed and adjusting in bed.     Baseline max A - total A reported by family (06/19/2018); 07/08/18: still requires maxA, dtr reports improvement in rolling since starting therapy; 6/23: min A to supervision in clinic; not doing at home right; 8/5: Pt's daughter states that his rolling has improved, but still has difficulty with all other bed mobility; 10/23/18: pts daughter states  that she is doing about 35% of the work if he is in proper position, he does uses the bed rails to help roll;  using Harrel Lemon for coming to sit EOB; 10/12: pts daughter states that she is doing about 30% of the work if he is in proper position, he does uses the bed rails to help roll, but it is improving;  using Hoyer for coming to sit EOB, 10/29: min A for sit to sidelying, rolling supervision, able to transition sidelying to sit with HHA;    Time 12    Period Weeks    Status Achieved    Target Date 02/02/20      PT LONG TERM GOAL #2   Title Patient will complete sit <> stand transfer chair to chair with Min A and LRAD to improve functional independence for household and community mobility.     Baseline supervision for STS, still requires elevated surface height to 27"    Time 12    Period Weeks    Status Achieved    Target Date 02/02/20      PT LONG TERM GOAL #3   Title Patient will navigate power w/c with min A x 100 feet to improve mobility for household and short community distances.    Baseline required total A (06/12/2018); able to roll 20 feet with min A (06/20/2018); 07/08/18: Pt can go approximately 10' without assist per family;  10/23/2018: pt's daughter states that his power WC mobility has become 100% better with the new hand control, slowed speed, and with practice; 10/12: pt's daughter reports that he can perform household WC mobility and limited  community ambulation with minA    Time 12    Period Weeks    Status Achieved    Target Date 02/02/20      PT LONG TERM GOAL #4   Title Patient will complete car transfers W/C to family SUV with min A using LRAD to improve his abilty to participate in community activities.     Baseline unable to complete car transfers at all (06/12/2018); unable to complete car transfers at all (06/19/2018).; 07/08/18: Unable to perform at this time; 09/11/18: unable to perform at this time; 10/23/2018: unable to complete car transfers at all; 10/12:  unable to complete car transfers at all, 10/29: unable; 01/13/19: unable; 2/11: min-mod A +2, 3/10: min A+1    Time 12    Period Weeks    Status Achieved    Target Date 02/02/20      PT LONG TERM GOAL #5   Title Patient will ambulate at least 10 feet with min A using LRAD to improve mobility for household and community distances.    Baseline unable to take steps (06/12/2018); able to shift R leg forward and back with max A and RW at edge of plinth (06/19/2018); 07/08/18: unable to ambulate at this time. 09/11/18: unable to perform at this time; 10/23/2018: unable to perform at this time; 10/12: unable to perform at this time, 10/29: unable at this time; 01/13/19: unable at this time, 2/11: min A to step with RLE, unable to step with LLE, 3/10: able to take 2 steps in parallel bars with min A for safety; 4/1: min A for walking 10 feet in parallel bars, 5/24: min A walking 10 feet in parallel bars; 6/21: no change 7/14: no change    Time 12    Period Weeks    Status Partially Met    Target Date 02/02/20  PT LONG TERM GOAL #6   Title Patient will be able to safety navigate up/down ramp with power chair, exhibiting good safety awareness, mod I to safely enter/exit his home.    Baseline 2/11: supervision with intermittent min A;4/1: supervision; 5/24: supervision    Time 12    Period Weeks    Status Partially Met    Target Date 02/02/20      PT LONG TERM GOAL #7   Title  Patient will increase functional reach test to >15 inches in sitting to exhibit improved sitting balance and positioning and reduce fall risk;    Baseline 07/30/18: 12 inches; 09/11/18: 10-12 inches; 10/23/18: 12-13 inch; 10/12: 14.5", 10/29: 15.5 inch    Time 4    Period Weeks    Status Achieved    Target Date 02/02/20      PT LONG TERM GOAL #8   Title Patient will increase LLE quad strength to 3+/5 to improve functional strength for standing and mobility;    Baseline 10/28: 2+/5; 01/13/19: 2+/5, 2/11: 3-/5, 04/16/19: 3-/5, 05/08/19: 3-/5, 5/24: 3-/5, 6/21: 3-/5, 7/14: 3-/5    Time 12    Period Weeks    Status Partially Met    Target Date 02/02/20      PT LONG TERM GOAL  #9   TITLE Patient will report no vertigo with provoking motions or positions in order to be able to perform ADLs and mobility tasks without symptoms.    Baseline 06/30/19: reports no dizziness in last week; 6/21: one episode this weekend; 7/14: experiencing one a week;    Time 12    Period Weeks    Status Partially Met    Target Date 02/02/20                 Plan - 01/07/20 1440    Clinical Impression Statement Patient motivated and participated well within session. he exhibits a heavy left lateral lean and posterior lean at start of session. Pt was max A +3 for scoot pivot to mat table and mod A +2 for sit to supine; Patient exhibits decreased trunk control and difficulty correcting trunk lean. He was instructed in LE strengthening exercise. He required mod VCs and visual cues to increase AROM especially on LLE for better motor control and muscle activation. Patient was instructed in seated unsupported with feet on floor, requiring min A for sitting balance. utilized Geologist, engineering for visual feedback for reorient to midline. Patient instructed in reaching with good tolerance and reorientation to midline with minimal discomfort. By end of PT session he was able to sit edge of mat table supervision with better trunk control.  Patient would benefit from additional skilled PT Intervention to improve strength, balance and mobility;    Personal Factors and Comorbidities Age;Comorbidity 3+    Comorbidities Relevant past medical history and comorbidities include long term steroid use for lupus, CKD, chronic osteomyelitis, cervical spine stenosis, BPH, APS, alcohol abuse, peripheral artery disease, depression, GERD, obstructive sleep apnea, HTN, IBS, IgA deficiency, lumbar radiculopathy, RA, Stroke, toxic maculopathy in both eyes, systemic lupus erythematosus related syndrome, cardiac catheterization, carotid endarterectomy, cervical laminectomy, coronary angioplasty, Fusion C5-C7, knee arthroplasty, lumbar surgery.    Examination-Activity Limitations Bed Mobility;Bend;Sit;Toileting;Stand;Stairs;Lift;Transfers;Squat;Locomotion Level;Carry;Dressing;Hygiene/Grooming;Continence    Examination-Participation Restrictions Yard Work;Interpersonal Relationship;Community Activity    Stability/Clinical Decision Making Evolving/Moderate complexity    Rehab Potential Fair    PT Frequency 3x / week    PT Duration 12 weeks    PT Treatment/Interventions ADLs/Self Care Home Management;Electrical Stimulation;Moist  Heat;Cryotherapy;Gait training;Stair training;Functional mobility training;Therapeutic exercise;Balance training;Neuromuscular re-education;Cognitive remediation;Patient/family education;Orthotic Fit/Training;Wheelchair mobility training;Manual techniques;Passive range of motion;Energy conservation;Joint Manipulations;Canalith Repostioning;Therapeutic activities;Vestibular    PT Next Visit Plan Work on strength, sitting/standing balance, functional activities such as rolling, bed mobility, transfers; caregiver training    PT Home Exercise Plan no updates this session    Consulted and Agree with Plan of Care Patient;Family member/caregiver    Family Member Consulted DTR           Patient will benefit from skilled therapeutic  intervention in order to improve the following deficits and impairments:  Abnormal gait, Decreased activity tolerance, Decreased cognition, Decreased endurance, Decreased knowledge of use of DME, Decreased range of motion, Decreased skin integrity, Decreased strength, Impaired perceived functional ability, Impaired sensation, Impaired UE functional use, Improper body mechanics, Pain, Cardiopulmonary status limiting activity, Decreased balance, Decreased coordination, Decreased mobility, Difficulty walking, Impaired tone, Postural dysfunction, Dizziness  Visit Diagnosis: Muscle weakness (generalized)  Other lack of coordination  Difficulty in walking, not elsewhere classified  Repeated falls     Problem List Patient Active Problem List   Diagnosis Date Noted  . Sepsis ([place]) 10/18/2018    Trotter,Margaret PT, DPT 01/07/2020, 2:43 PM  [place] MAIN [facility] SERVICES [address] [place], [place], [address] Phone: [phone]   Fax:  [phone]  Name: SHERROD TOOTHMAN MRN: [id number] Date of Birth: [birth year]

## 2020-01-08 ENCOUNTER — Other Ambulatory Visit: Payer: Self-pay

## 2020-01-08 ENCOUNTER — Encounter: Payer: Self-pay | Admitting: Physical Therapy

## 2020-01-08 ENCOUNTER — Encounter: Payer: Self-pay | Admitting: Occupational Therapy

## 2020-01-08 ENCOUNTER — Ambulatory Visit: Payer: Medicare Other | Admitting: Occupational Therapy

## 2020-01-08 ENCOUNTER — Ambulatory Visit: Payer: Medicare Other | Admitting: Physical Therapy

## 2020-01-08 DIAGNOSIS — R296 Repeated falls: Secondary | ICD-10-CM

## 2020-01-08 DIAGNOSIS — M6281 Muscle weakness (generalized): Secondary | ICD-10-CM | POA: Diagnosis not present

## 2020-01-08 DIAGNOSIS — R262 Difficulty in walking, not elsewhere classified: Secondary | ICD-10-CM

## 2020-01-08 DIAGNOSIS — R278 Other lack of coordination: Secondary | ICD-10-CM

## 2020-01-08 NOTE — Therapy (Signed)
[place] MAIN [facility] SERVICES [address] [place], [place], [address] Phone: [phone]   Fax:  [phone]  Occupational Therapy Treatment  Patient Details  Name: Kenneth Summers MRN: [id number] Date of Birth: 1945-08-19 No data recorded  Encounter Date: 01/08/2020   OT End of Session - 01/08/20 1313    Visit Number 156    Number of Visits 181    Date for OT Re-Evaluation 03/01/20    Authorization Type Progress report period starting 07/21/2019    Activity Tolerance Patient tolerated treatment well    Behavior During Therapy [facility] for tasks assessed/performed           Past Medical History:  Diagnosis Date  . Actinic keratosis   . Alcohol abuse   . APS (antiphospholipid syndrome) ([place])   . Basal cell carcinoma   . BPH (benign prostatic hyperplasia)   . CAD (coronary artery disease)   . Cellulitis   . Cervical spinal stenosis   . Chronic osteomyelitis ([place])   . CKD (chronic kidney disease)   . CKD (chronic kidney disease)   . Clostridium difficile diarrhea   . Depression   . Diplopia   . Fatigue   . GERD (gastroesophageal reflux disease)   . Hyperlipemia   . Hypertension   . Hypovitaminosis D   . IBS (irritable bowel syndrome)   . 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 Insomnia   . Left lumbosacral radiculopathy   . Moderate obstructive sleep apnea   . Osteomyelitis of foot ([place])   . PAD (peripheral artery disease) ([place])   . Pruritus   . RA (rheumatoid arthritis) ([place])   . Radiculopathy   . Restless legs syndrome   . RLS (restless legs syndrome)   . SLE (systemic lupus erythematosus related syndrome) ([place])   . 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 Stroke ([place])   . Toxic maculopathy of both eyes     Past Surgical History:  Procedure Laterality Date  . CARDIAC CATHETERIZATION    . CAROTID ENDARTERECTOMY    . CERVICAL LAMINECTOMY    . CORONARY ANGIOPLASTY    . FRACTURE SURGERY    .  fusion C5-6-7    . HERNIA REPAIR    . KNEE ARTHROSCOPY    . TONSILLECTOMY      There were no vitals filed for this visit.   Subjective Assessment - 01/08/20 1309    Subjective  Pt. reports no addiitonal falls.    Patient is accompanied by: Family member    Pertinent History Pt. is a 74 y.o. 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 Pt. PMHx includes: Multiple Falls, Lupus, DJD, Renal Abscess, CVA. Pt. resides with his wife. Pt.'s wife and daughter assist with ADLs. Pt. has caregivers in  for 2 hours a day, 6 days a week. Pt. received Rehab services in acute care, at SNF for STR, and [facility]. Pt. is retired from [employer] for [employer] and [employer].    Currently in Pain? No/denies          OT TREATMENT   Therapeutic Exercise:  Pt. performed3#dowelfor UE strengthening secondary to weakness.Pt.tolerated increased dowel weight.Bilateral shoulder flexion, chest press,andcircular patterns were performedfor2 sets 20 reps each.Pt. worked on3# dumbbell ex. forbilateralelbow flexion and extension,3# for right, 3#. Pt. perfromed forearm supination/pronation, wrist flexion/extension 3# for the RUE, and 2# with the left for 1 set of 10-  20and radial deviation 1 set 20 reps.  Pt. reports fatigue today. Pt. Reports that no additonal dizziness episodes. Pt.continuestomake progress with BUE strength, and tolerance for there. Ex.p t.continues to present with weakness, as well asimpaired motor control, and motor planning.Pt. tolerated BUE exercises well, with no reportsofdiscomfort. Pt. required consistent verbal, visual, and tactile cues. Pt.continues torequire less cuesto perform UE strengthening exercises, technique, and formas well as visual demonstration prior to the exercises.Pt. continues to work on improving BUE strength, and [facility] skills in order to improve LUE functioning during ADLs, IADLs, and tranfers.                         OT Education - 01/08/20 1312    Education Details BUE ther. ex    Person(s) Educated Patient    Methods Explanation;Demonstration    Comprehension Returned demonstration;Tactile cues required;Verbalized understanding;Need further instruction               OT Long Term Goals - 12/08/19 1437      OT LONG TERM GOAL #1   Title Pt. will increase left shoulder flexion AROM by 10 degrees to access cabinet/shelf.    Baseline Pt. continues to progress with left shoulder ROM .Shouler flexion 130, and continues to work on reaching with increasing emphasis on flexing trunk to improve functional reach.  07/21/19- shoulder flexion to 130 with effort    Time 12    Period Weeks    Status Not Met    Target Date 03/01/20      OT LONG TERM GOAL #2   Title Pt. will donn a shirt with Supervision.    Baseline Pt. continues to require MinA donning a zip down jacket with consistent cues to initiate the correct direction of the jacket.  MinA for donning a T-shirt with assist to roll side to side to pull the shirt down over his back. 07/21/19 continues to require min assist at times for shirt/jacket    Time 12    Period Weeks    Status On-going    Target Date 03/01/20      OT LONG TERM GOAL #3   Title Pt. will require ModA to perform LE dressing    Baseline MaxA left, ModA with the right. Pt. has sores on his ankle, and feet. Pt.'s daughter is assisting.    Time 12    Period Weeks    Status Deferred    Target Date 03/02/19      OT LONG TERM GOAL #4   Title Pt. will improve left grip strength by 10# to be able to open a jar/container.    Baseline Pt. continues to present with limited left hand grip strength. Pt. continues to work towards opening containers, and bottles.    Time 12    Period Weeks    Status On-going    Target Date 03/02/19      OT LONG TERM GOAL #5   Title Pt. will improve left hand [facility] skills to be able to assist with  buttoning/zipping.    Baseline Pt. has improved overall donning a jacket, and  manipulating the zipper on his jacket, however continues to require practice. Pt. continues to work on improving bilateral [facility] skills for buttoning/zipping.  07/21/19 improving with buttons and zippers.    Time 12    Period Weeks    Status On-going    Target Date 03/01/20      OT LONG TERM GOAL #6  Title Pt. will demonstrate visual conmpensatory strategies for 100% of the time during ADLs    Baseline Pt.continues to require intermittent cues cues for left sides awareness.    Time 12    Period Weeks    Status Partially Met    Target Date 03/01/20      OT LONG TERM GOAL #7   Title Pt. will prepare a simple cold snack from w/c with supervision using cognitive compensatory strategies 100% of the time.    Baseline Pt. has difficulty with getting the w/c close enough to access the refrigerator    Time 12    Period Weeks    Status Deferred      OT LONG TERM GOAL #8   Title Pt. will accurately identify potential safety hazard using good safety awareness, and judgement 100% for ADLs, and IADLs.    Baseline Pt. continues to require cues    Time 12    Period Weeks    Status On-going    Target Date 03/01/20      OT LONG TERM GOAL  #9   TITLE Pt. will  navigate his w/c around obstacles with Supervision and 100% accuracy    Baseline Pt. continues to require cuing for obstacles on the left, however requires less cues overall. Pt. requires significant cues to engage his left UE during tasks. 07/21/2019 continues to improve with decreased assist at times    Time 12    Period Weeks    Status On-going    Target Date 03/01/20      OT LONG TERM GOAL  #10   TITLE Pt. will independently be able  to reach to place items into cabinetry, and closets.    Baseline Pt. BUE strength is improving. UE functional reaching conitnues to be limited.    Time 12    Period Weeks    Status On-going    Target Date 03/01/20      OT  LONG TERM GOAL  #11   TITLE Pt. will increase BUE strength by 38mm grades in preparation for ADL transfers.    Baseline Pt. in improveing BUE strength, and  is able to tolertae increased resistive weights. Pt. continues to work towards improving overall strength.    Time 12    Period Weeks    Status On-going    Target Date 03/01/20      OT LONG TERM GOAL  #12   TITLE Pt. will improve LUE functional reaching with the left engaging the trunk 100% of the time during ADL tasks with minimal cues.    Baseline Pt. conitnues to improve with LUE functional reaching. Fewer cues are required to engage his trunk for extended reaching.    Time 12    Period Weeks    Status On-going    Target Date 03/01/20                 Plan - 01/08/20 1318    Clinical Impression Statement Pt. reports fatigue today. Pt. Reports that no additonal dizziness episodes. Pt.continuestomake progress with BUE strength, and tolerance for there. Ex.p t.continues to present with weakness, as well asimpaired motor control, and motor planning.Pt. tolerated BUE exercises well, with no reportsofdiscomfort. Pt. required consistent verbal, visual, and tactile cues. Pt.continues torequire less cuesto perform UE strengthening exercises, technique, and formas well as visual demonstration prior to the exercises.Pt. continues to work on improving BUE strength, and [facility] skills in order to improve LUE functioning during ADLs, IADLs, and tranfers.  OT Occupational Profile and History Problem Focused Assessment - Including review of records relating to presenting problem    Occupational performance deficits (Please refer to evaluation for details): ADL's    Body Structure / Function / Physical Skills UE functional use;Coordination;FMC;Dexterity;Strength;ROM    Cognitive Skills Attention;Memory;Emotional;Problem Solve;Safety Awareness    Rehab Potential Good    Clinical Decision Making Several treatment options, min-mod task  modification necessary    Comorbidities Affecting Occupational Performance: Presence of comorbidities impacting occupational performance    OT Frequency 2x / week    OT Duration 12 weeks    OT Treatment/Interventions Self-care/ADL training;DME and/or AE instruction;Therapeutic exercise;Therapeutic activities;Moist Heat;Cognitive remediation/compensation;Neuromuscular education;Visual/perceptual remediation/compensation;Coping strategies training;Patient/family education;Passive range of motion;Psychosocial skills training;Energy conservation;Functional Mobility Training    Consulted and Agree with Plan of Care Patient;Family member/caregiver           Patient will benefit from skilled therapeutic intervention in order to improve the following deficits and impairments:   Body Structure / Function / Physical Skills: UE functional use, Coordination, FMC, Dexterity, Strength, ROM Cognitive Skills: Attention, Memory, Emotional, Problem Solve, Safety Awareness     Visit Diagnosis: Muscle weakness (generalized)  Other lack of coordination    Problem List Patient Active Problem List   Diagnosis Date Noted  . Sepsis ([place]) 10/18/2018    Harrel Carina, MS, OTR/L 01/08/2020, 1:26 PM  [place] MAIN [facility] SERVICES [address] [place], [place], [address] Phone: [phone]   Fax:  [phone]  Name: Kenneth Summers MRN: [id number] Date of Birth: 30-Nov-1945

## 2020-01-08 NOTE — Therapy (Signed)
[place] [facility]IN [facility] [address] [place], [place], [address] Phone: [phone]   Fax:  [phone]  Physical Therapy Treatment  Patient Details  Name: Kenneth Summers MRN: [id number] Date of Birth: 1945-12-01 No data recorded  Encounter Date: 01/08/2020   PT End of Session - 01/08/20 1259    Visit Number [id number]    Number of Visits 252    Date for PT Re-Evaluation 02/02/20    PT Start Time 3748    PT Stop Time 1430    PT Time Calculation (min) 45 min    Equipment Utilized During Treatment Gait belt    Activity Tolerance Patient tolerated treatment well;No increased pain    Behavior During Therapy WFL for tasks assessed/performed           Past Medical History:  Diagnosis Date  . Actinic keratosis   . Alcohol abuse   . APS (antiphospholipid syndrome) ([place])   . Basal cell carcinoma   . BPH (benign prostatic hyperplasia)   . CAD (coronary artery disease)   . Cellulitis   . Cervical spinal stenosis   . Chronic osteomyelitis ([place])   . CKD (chronic kidney disease)   . CKD (chronic kidney disease)   . Clostridium difficile diarrhea   . Depression   . Diplopia   . Fatigue   . GERD (gastroesophageal reflux disease)   . Hyperlipemia   . Hypertension   . Hypovitaminosis D   . IBS (irritable bowel syndrome)   . 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 Insomnia   . Left lumbosacral radiculopathy   . Moderate obstructive sleep apnea   . Osteomyelitis of foot ([place])   . PAD (peripheral artery disease) ([place])   . Pruritus   . RA (rheumatoid arthritis) ([place])   . Radiculopathy   . Restless legs syndrome   . RLS (restless legs syndrome)   . SLE (systemic lupus erythematosus related syndrome) ([place])   . 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 Stroke ([place])   . Toxic maculopathy of both eyes     Past Surgical History:  Procedure Laterality Date  . CARDIAC CATHETERIZATION    . CAROTID ENDARTERECTOMY    .  CERVICAL LAMINECTOMY    . CORONARY ANGIOPLASTY    . FRACTURE SURGERY    . fusion C5-6-7    . HERNIA REPAIR    . KNEE ARTHROSCOPY    . TONSILLECTOMY      There were no vitals filed for this visit.   Subjective Assessment - 01/08/20 1347    Subjective Patient reports doing well; Denies any pain; Denies any dizziness. He reports he is still leaning to left side;    Pertinent History Patient is a 74 y.o. male who presents to outpatient physical therapy with a referral for medical diagnosis of CVA. This patient's chief complaints consist of left hemiplegia and overall deconditioning leading to the following functional deficits: dependent for ADLs, IADLs, unable to transfer to car, dependent transfers at home with hoyer lift, unable to walk or stand without significant assistance, difficulty with W/C navigation..    Diagnostic tests Brain MRI 11/01/2017: IMPRESSION: 1. Acute right ACA territory infarct as demonstrated on prior CT imaging. No intracranial hemorrhage or significant mass effect. 2. 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    Patient Stated Goals wants to be able to walk again    Currently in Pain? No/denies  Pain Onset Today    Multiple Pain Sites No               TREATMENT: Patient transferred squat pivot wheelchair to mat table, max A +2 with cues for hand placement and trunk position. Patient exhibits heavy posterior left lateral lean requiring increased assistance for safety;  He required mod A +2 for transition sit to supine;   Patient was dependent for scooting up in bed;   Patient hooklying: Lumbar trunk rotation x5 reps each direction requiring mod VCs for increased ROM keeping LLE knee close to RLE Hip flexion march x10 reps each LE with visual cues to increase ROM, AAROM on LLE; SLR hip flexion x10 reps RLE, x5 reps LLE AAROM;  Bridges with arms by side 3 sec hold x12 reps with cues for increased ROM and increased hold  time with gluteal squeeze  Patient required min A +1 for transition supine to sidelying with cues for UE/LE positioning;  Patient required mod A +2 for transition sidelying to sitting with cues for hand placement and trunk control;   Upon sitting, instructed patient in neutral weight shift: Sitting unsupported with feet supported and mirror for visual cues for neutral weight shift:  -reaching with UE in various directions touching ball x10 reps each UE -therapist behind patient passing ball side/side with BUE reaching x5 reps with +2 assist supervision for safety for balance control in sitting;  Patient required min A throughout all sitting for better postural control and weight shift;     Patient transferred squat pivot mat table to wheelchair with mod A +3 with cues for hand placement and positioning.  Patient tolerated session well. He reports minimal fatigue at end of session although did get fatigued with some exercise requiring short rest break. He was able to exhibit better trunk control at end of session being able to achieve sitting edge of mat table with close supervision without loss of balance. Patient would benefit from additional skilled PT intervention to improve trunk control and LE strength for increased mobility;                        PT Education - 01/08/20 1259    Education Details LE strengthening, trunk control, positioning, HEP    Person(s) Educated Patient    Methods Explanation;Verbal cues    Comprehension Verbalized understanding;Returned demonstration;Verbal cues required;Need further instruction            PT Short Term Goals - 08/18/19 1305      PT SHORT TERM GOAL #1   Title Be independent with initial home exercise program for self-management of symptoms.    Baseline HEP to be given at second session; advice to practice unsupported sitting at home (06/19/2018); 07/08/18: Performing at home, 6/29: needs assistance but is doing exercise  program regularly; 08/22/18: adherent;    Time 2    Period Weeks    Status Achieved    Target Date 06/26/18             PT Long Term Goals - 11/10/19 1323      PT LONG TERM GOAL #1   Title Patient will complete all bed mobility with min A to improve functional independence for getting in and out of bed and adjusting in bed.     Baseline max A - total A reported by family (06/19/2018); 07/08/18: still requires maxA, dtr reports improvement in rolling since starting therapy; 6/23: min A to supervision in  clinic; not doing at home right; 8/5: Pt's daughter states that his rolling has improved, but still has difficulty with all other bed mobility; 10/23/18: pts daughter states that she is doing about 35% of the work if he is in proper position, he does uses the bed rails to help roll;  using Harrel Lemon for coming to sit EOB; 10/12: pts daughter states that she is doing about 30% of the work if he is in proper position, he does uses the bed rails to help roll, but it is improving;  using Hoyer for coming to sit EOB, 10/29: min A for sit to sidelying, rolling supervision, able to transition sidelying to sit with HHA;    Time 12    Period Weeks    Status Achieved    Target Date 02/02/20      PT LONG TERM GOAL #2   Title Patient will complete sit <> stand transfer chair to chair with Min A and LRAD to improve functional independence for household and community mobility.     Baseline supervision for STS, still requires elevated surface height to 27"    Time 12    Period Weeks    Status Achieved    Target Date 02/02/20      PT LONG TERM GOAL #3   Title Patient will navigate power w/c with min A x 100 feet to improve mobility for household and short community distances.    Baseline required total A (06/12/2018); able to roll 20 feet with min A (06/20/2018); 07/08/18: Pt can go approximately 10' without assist per family;  10/23/2018: pt's daughter states that his power WC mobility has become 100% better with the  new hand control, slowed speed, and with practice; 10/12: pt's daughter reports that he can perform household WC mobility and limited community ambulation with minA    Time 12    Period Weeks    Status Achieved    Target Date 02/02/20      PT LONG TERM GOAL #4   Title Patient will complete car transfers W/C to family SUV with min A using LRAD to improve his abilty to participate in community activities.     Baseline unable to complete car transfers at all (06/12/2018); unable to complete car transfers at all (06/19/2018).; 07/08/18: Unable to perform at this time; 09/11/18: unable to perform at this time; 10/23/2018: unable to complete car transfers at all; 10/12:  unable to complete car transfers at all, 10/29: unable; 01/13/19: unable; 2/11: min-mod A +2, 3/10: min A+1    Time 12    Period Weeks    Status Achieved    Target Date 02/02/20      PT LONG TERM GOAL #5   Title Patient will ambulate at least 10 feet with min A using LRAD to improve mobility for household and community distances.    Baseline unable to take steps (06/12/2018); able to shift R leg forward and back with max A and RW at edge of plinth (06/19/2018); 07/08/18: unable to ambulate at this time. 09/11/18: unable to perform at this time; 10/23/2018: unable to perform at this time; 10/12: unable to perform at this time, 10/29: unable at this time; 01/13/19: unable at this time, 2/11: min A to step with RLE, unable to step with LLE, 3/10: able to take 2 steps in parallel bars with min A for safety; 4/1: min A for walking 10 feet in parallel bars, 5/24: min A walking 10 feet in parallel bars; 6/21: no change  7/14: no change    Time 12    Period Weeks    Status Partially Met    Target Date 02/02/20      PT LONG TERM GOAL #6   Title Patient will be able to safety navigate up/down ramp with power chair, exhibiting good safety awareness, mod I to safely enter/exit his home.    Baseline 2/11: supervision with intermittent min A;4/1: supervision;  5/24: supervision    Time 12    Period Weeks    Status Partially Met    Target Date 02/02/20      PT LONG TERM GOAL #7   Title Patient will increase functional reach test to >15 inches in sitting to exhibit improved sitting balance and positioning and reduce fall risk;    Baseline 07/30/18: 12 inches; 09/11/18: 10-12 inches; 10/23/18: 12-13 inch; 10/12: 14.5", 10/29: 15.5 inch    Time 4    Period Weeks    Status Achieved    Target Date 02/02/20      PT LONG TERM GOAL #8   Title Patient will increase LLE quad strength to 3+/5 to improve functional strength for standing and mobility;    Baseline 10/28: 2+/5; 01/13/19: 2+/5, 2/11: 3-/5, 04/16/19: 3-/5, 05/08/19: 3-/5, 5/24: 3-/5, 6/21: 3-/5, 7/14: 3-/5    Time 12    Period Weeks    Status Partially Met    Target Date 02/02/20      PT LONG TERM GOAL  #9   TITLE Patient will report no vertigo with provoking motions or positions in order to be able to perform ADLs and mobility tasks without symptoms.    Baseline 06/30/19: reports no dizziness in last week; 6/21: one episode this weekend; 7/14: experiencing one a week;    Time 12    Period Weeks    Status Partially Met    Target Date 02/02/20                 Plan - 01/08/20 1518    Clinical Impression Statement Patient motivated and participated well within session. He continues to have difficulty with squat pivot transfers requiring max A+2 with heavy left/posterior lean. Patient instructed in advanced LE strengthening exercise. He was able to exhibit better LLE AROM this session but does require AAROM for full range. He was able to exhibit better sitting balance and posture at end of session with less left lateral lean. Patient would benefit from additional skilled PT Intervention to improve strength, balance and mobility;    Personal Factors and Comorbidities Age;Comorbidity 3+    Comorbidities Relevant past medical history and comorbidities include long term steroid use for lupus, CKD,  chronic osteomyelitis, cervical spine stenosis, BPH, APS, alcohol abuse, peripheral artery disease, depression, GERD, obstructive sleep apnea, HTN, IBS, IgA deficiency, lumbar radiculopathy, RA, Stroke, toxic maculopathy in both eyes, systemic lupus erythematosus related syndrome, cardiac catheterization, carotid endarterectomy, cervical laminectomy, coronary angioplasty, Fusion C5-C7, knee arthroplasty, lumbar surgery.    Examination-Activity Limitations Bed Mobility;Bend;Sit;Toileting;Stand;Stairs;Lift;Transfers;Squat;Locomotion Level;Carry;Dressing;Hygiene/Grooming;Continence    Examination-Participation Restrictions Yard Work;Interpersonal Relationship;Community Activity    Stability/Clinical Decision Making Evolving/Moderate complexity    Rehab Potential Fair    PT Frequency 3x / week    PT Duration 12 weeks    PT Treatment/Interventions ADLs/Self Care Home Management;Electrical Stimulation;Moist Heat;Cryotherapy;Gait training;Stair training;Functional mobility training;Therapeutic exercise;Balance training;Neuromuscular re-education;Cognitive remediation;Patient/family education;Orthotic Fit/Training;Wheelchair mobility training;Manual techniques;Passive range of motion;Energy conservation;Joint Manipulations;Canalith Repostioning;Therapeutic activities;Vestibular    PT Next Visit Plan Work on strength, sitting/standing balance, functional activities such as rolling, bed  mobility, transfers; caregiver training    PT Home Exercise Plan no updates this session    Consulted and Agree with Plan of Care Patient;Family member/caregiver    Family Member Consulted DTR           Patient will benefit from skilled therapeutic intervention in order to improve the following deficits and impairments:  Abnormal gait, Decreased activity tolerance, Decreased cognition, Decreased endurance, Decreased knowledge of use of DME, Decreased range of motion, Decreased skin integrity, Decreased strength, Impaired  perceived functional ability, Impaired sensation, Impaired UE functional use, Improper body mechanics, Pain, Cardiopulmonary status limiting activity, Decreased balance, Decreased coordination, Decreased mobility, Difficulty walking, Impaired tone, Postural dysfunction, Dizziness  Visit Diagnosis: Muscle weakness (generalized)  Other lack of coordination  Difficulty in walking, not elsewhere classified  Repeated falls     Problem List Patient Active Problem List   Diagnosis Date Noted  . Sepsis ([place]) 10/18/2018    Ayanah Snader PT, DPT 01/08/2020, 3:20 PM  [place] [facility] SERVICES [address] [place], [place], [address] Phone: [phone]   Fax:  [phone]  Name: Kenneth Summers MRN: [id number] Date of Birth: 06/17/1945

## 2020-01-12 ENCOUNTER — Ambulatory Visit: Payer: Medicare Other

## 2020-01-12 ENCOUNTER — Ambulatory Visit: Payer: Medicare Other | Admitting: Occupational Therapy

## 2020-01-12 ENCOUNTER — Encounter: Payer: Self-pay | Admitting: Occupational Therapy

## 2020-01-12 ENCOUNTER — Other Ambulatory Visit: Payer: Self-pay

## 2020-01-12 ENCOUNTER — Encounter: Payer: Self-pay | Admitting: Physical Therapy

## 2020-01-12 DIAGNOSIS — I69354 Hemiplegia and hemiparesis following cerebral infarction affecting left non-dominant side: Secondary | ICD-10-CM

## 2020-01-12 DIAGNOSIS — M6281 Muscle weakness (generalized): Secondary | ICD-10-CM | POA: Diagnosis not present

## 2020-01-12 DIAGNOSIS — R296 Repeated falls: Secondary | ICD-10-CM

## 2020-01-12 DIAGNOSIS — R278 Other lack of coordination: Secondary | ICD-10-CM

## 2020-01-12 DIAGNOSIS — R262 Difficulty in walking, not elsewhere classified: Secondary | ICD-10-CM

## 2020-01-12 NOTE — Therapy (Signed)
[place] MAIN [facility] SERVICES [address] [place], [place], [address] Phone: [phone]   Fax:  [phone]  Occupational Therapy Treatment  Patient Details  Name: Kenneth Summers MRN: [id number] Date of Birth: 03-05-1945 No data recorded  Encounter Date: 01/12/2020   OT End of Session - 01/12/20 1527    Visit Number 157    Number of Visits 181    Date for OT Re-Evaluation 03/01/20    Authorization Type Progress report period starting 07/21/2019    OT Start Time 1348    OT Stop Time 1430    OT Time Calculation (min) 42 min    Activity Tolerance Patient tolerated treatment well    Behavior During Therapy [facility] for tasks assessed/performed           Past Medical History:  Diagnosis Date  . Actinic keratosis   . Alcohol abuse   . APS (antiphospholipid syndrome) ([place])   . Basal cell carcinoma   . BPH (benign prostatic hyperplasia)   . CAD (coronary artery disease)   . Cellulitis   . Cervical spinal stenosis   . Chronic osteomyelitis ([place])   . CKD (chronic kidney disease)   . CKD (chronic kidney disease)   . Clostridium difficile diarrhea   . Depression   . Diplopia   . Fatigue   . GERD (gastroesophageal reflux disease)   . Hyperlipemia   . Hypertension   . Hypovitaminosis D   . IBS (irritable bowel syndrome)   . 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 Insomnia   . Left lumbosacral radiculopathy   . Moderate obstructive sleep apnea   . Osteomyelitis of foot ([place])   . PAD (peripheral artery disease) ([place])   . Pruritus   . RA (rheumatoid arthritis) ([place])   . Radiculopathy   . Restless legs syndrome   . RLS (restless legs syndrome)   . SLE (systemic lupus erythematosus related syndrome) ([place])   . 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 Stroke ([place])   . Toxic maculopathy of both eyes     Past Surgical History:  Procedure Laterality Date  . CARDIAC CATHETERIZATION    . CAROTID ENDARTERECTOMY    .  CERVICAL LAMINECTOMY    . CORONARY ANGIOPLASTY    . FRACTURE SURGERY    . fusion C5-6-7    . HERNIA REPAIR    . KNEE ARTHROSCOPY    . TONSILLECTOMY      There were no vitals filed for this visit.   Subjective Assessment - 01/12/20 1526    Subjective  Pt. reports no addiitonal falls.    Patient is accompanied by: Family member    Pertinent History Pt. is a 74 y.o. 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 Pt. PMHx includes: Multiple Falls, Lupus, DJD, Renal Abscess, CVA. Pt. resides with his wife. Pt.'s wife and daughter assist with ADLs. Pt. has caregivers in  for 2 hours a day, 6 days a week. Pt. received Rehab services in acute care, at SNF for STR, and [place] services. Pt. is retired from [employer] for [employer] and [employer].    Currently in Pain? No/denies           OT TREATMENT   Therapeutic Exercise:  Pt. performed2.5#dowelfor UE strengthening secondary to weakness.Pt.tolerated increased dowel weight.Bilateral shoulder flexion, chest press,andcircular patterns were performedfor2 sets 20 reps each.Pt. worked on3# dumbbell ex. forbilateralelbow flexion and  extension, 3# for right, 3#. Pt. perfromed forearm supination/pronation, wrist flexion/extension 3# for the RUE, and 2# with the left for 1 set of10-20and radial deviation 1 set 20 reps.  Pt. reports that no additonal dizziness episodes. Pt.continuestomake progress with BUE strength, and tolerance for there. Ex.Pt.continues to present with weakness, as well asimpaired motor control, and motor planning.Pt. tolerated BUE exercises well, with no reportsofdiscomfort. Pt. required consistent verbal, visual, and tactile cues. Pt.continues torequire less cuesto perform UE strengthening exercises, technique, and formas well as visual demonstration prior to the exercises.Pt. continues to work on improving BUE strength, and [facility] skills in order to improve LUE  functioning during ADLs, IADLs, and tranfers.                           OT Education - 01/12/20 1527    Education Details BUE ther. ex    Person(s) Educated Patient    Methods Explanation;Demonstration    Comprehension Returned demonstration;Tactile cues required;Verbalized understanding;Need further instruction               OT Long Term Goals - 12/08/19 1437      OT LONG TERM GOAL #1   Title Pt. will increase left shoulder flexion AROM by 10 degrees to access cabinet/shelf.    Baseline Pt. continues to progress with left shoulder ROM .Shouler flexion 130, and continues to work on reaching with increasing emphasis on flexing trunk to improve functional reach.  07/21/19- shoulder flexion to 130 with effort    Time 12    Period Weeks    Status Not Met    Target Date 03/01/20      OT LONG TERM GOAL #2   Title Pt. will donn a shirt with Supervision.    Baseline Pt. continues to require MinA donning a zip down jacket with consistent cues to initiate the correct direction of the jacket.  MinA for donning a T-shirt with assist to roll side to side to pull the shirt down over his back. 07/21/19 continues to require min assist at times for shirt/jacket    Time 12    Period Weeks    Status On-going    Target Date 03/01/20      OT LONG TERM GOAL #3   Title Pt. will require ModA to perform LE dressing    Baseline MaxA left, ModA with the right. Pt. has sores on his ankle, and feet. Pt.'s daughter is assisting.    Time 12    Period Weeks    Status Deferred    Target Date 03/02/19      OT LONG TERM GOAL #4   Title Pt. will improve left grip strength by 10# to be able to open a jar/container.    Baseline Pt. continues to present with limited left hand grip strength. Pt. continues to work towards opening containers, and bottles.    Time 12    Period Weeks    Status On-going    Target Date 03/02/19      OT LONG TERM GOAL #5   Title Pt. will improve left hand  [facility] skills to be able to assist with buttoning/zipping.    Baseline Pt. has improved overall donning a jacket, and  manipulating the zipper on his jacket, however continues to require practice. Pt. continues to work on improving bilateral [facility] skills for buttoning/zipping.  07/21/19 improving with buttons and zippers.    Time 12    Period Weeks  Status On-going    Target Date 03/01/20      OT LONG TERM GOAL #6   Title Pt. will demonstrate visual conmpensatory strategies for 100% of the time during ADLs    Baseline Pt.continues to require intermittent cues cues for left sides awareness.    Time 12    Period Weeks    Status Partially Met    Target Date 03/01/20      OT LONG TERM GOAL #7   Title Pt. will prepare a simple cold snack from w/c with supervision using cognitive compensatory strategies 100% of the time.    Baseline Pt. has difficulty with getting the w/c close enough to access the refrigerator    Time 12    Period Weeks    Status Deferred      OT LONG TERM GOAL #8   Title Pt. will accurately identify potential safety hazard using good safety awareness, and judgement 100% for ADLs, and IADLs.    Baseline Pt. continues to require cues    Time 12    Period Weeks    Status On-going    Target Date 03/01/20      OT LONG TERM GOAL  #9   TITLE Pt. will  navigate his w/c around obstacles with Supervision and 100% accuracy    Baseline Pt. continues to require cuing for obstacles on the left, however requires less cues overall. Pt. requires significant cues to engage his left UE during tasks. 07/21/2019 continues to improve with decreased assist at times    Time 12    Period Weeks    Status On-going    Target Date 03/01/20      OT LONG TERM GOAL  #10   TITLE Pt. will independently be able  to reach to place items into cabinetry, and closets.    Baseline Pt. BUE strength is improving. UE functional reaching conitnues to be limited.    Time 12    Period Weeks    Status On-going     Target Date 03/01/20      OT LONG TERM GOAL  #11   TITLE Pt. will increase BUE strength by 37m grades in preparation for ADL transfers.    Baseline Pt. in improveing BUE strength, and  is able to tolertae increased resistive weights. Pt. continues to work towards improving overall strength.    Time 12    Period Weeks    Status On-going    Target Date 03/01/20      OT LONG TERM GOAL  #12   TITLE Pt. will improve LUE functional reaching with the left engaging the trunk 100% of the time during ADL tasks with minimal cues.    Baseline Pt. conitnues to improve with LUE functional reaching. Fewer cues are required to engage his trunk for extended reaching.    Time 12    Period Weeks    Status On-going    Target Date 03/01/20                 Plan - 01/12/20 1528    Clinical Impression Statement Pt. reports that no additonal dizziness episodes. Pt.continuestomake progress with BUE strength, and tolerance for there. Ex.Pt.continues to present with weakness, as well asimpaired motor control, and motor planning.Pt. tolerated BUE exercises well, with no reportsofdiscomfort. Pt. required consistent verbal, visual, and tactile cues. Pt.continues torequire less cuesto perform UE strengthening exercises, technique, and formas well as visual demonstration prior to the exercises.Pt. continues to work on improving BUE strength, and  [facility] skills in order to improve LUE functioning during ADLs, IADLs, and tranfers.    OT Occupational Profile and History Problem Focused Assessment - Including review of records relating to presenting problem    Occupational performance deficits (Please refer to evaluation for details): ADL's    Body Structure / Function / Physical Skills UE functional use;Coordination;FMC;Dexterity;Strength;ROM    Cognitive Skills Attention;Memory;Emotional;Problem Solve;Safety Awareness    Rehab Potential Good    Clinical Decision Making Several treatment options, min-mod  task modification necessary    Comorbidities Affecting Occupational Performance: Presence of comorbidities impacting occupational performance    Comorbidities impacting occupational performance description: Phyical, cognitive, visual,  medical comorbidities    Modification or Assistance to Complete Evaluation  No modification of tasks or assist necessary to complete eval    OT Frequency 2x / week    OT Duration 12 weeks    OT Treatment/Interventions Self-care/ADL training;DME and/or AE instruction;Therapeutic exercise;Therapeutic activities;Moist Heat;Cognitive remediation/compensation;Neuromuscular education;Visual/perceptual remediation/compensation;Coping strategies training;Patient/family education;Passive range of motion;Psychosocial skills training;Energy conservation;Functional Mobility Training    Consulted and Agree with Plan of Care Patient;Family member/caregiver    Family Member Consulted daughter [name]           Patient will benefit from skilled therapeutic intervention in order to improve the following deficits and impairments:   Body Structure / Function / Physical Skills: UE functional use, Coordination, FMC, Dexterity, Strength, ROM Cognitive Skills: Attention, Memory, Emotional, Problem Solve, Safety Awareness     Visit Diagnosis: Muscle weakness (generalized)  Other lack of coordination    Problem List Patient Active Problem List   Diagnosis Date Noted  . Sepsis ([place]) 10/18/2018    Kenneth Carina, MS,  OTR/L 01/12/2020, 5:19 PM  Kenneth Summers MAIN [facility] SERVICES [address] [place], [place], [address] Phone: [phone]   Fax:  [phone]  Name: Kenneth Summers MRN: [id number] Date of Birth: 08-11-1945

## 2020-01-12 NOTE — Therapy (Signed)
[place] MAIN [facility] SERVICES [address] [place], [place], [address] Phone: [phone]   Fax:  [phone]  Physical Therapy Treatment  Patient Details  Name: Kenneth Summers MRN: [id number] Date of Birth: [birth year] No data recorded  Encounter Date: 01/12/2020   PT End of Session - 01/12/20 1257    Visit Number [id number]    Number of Visits 252    Date for PT Re-Evaluation 02/02/20    Authorization Type Medicare reporting period starting 09/11/18; NO FOTO    Authorization Time Period goals last assessed 08/20/19    PT Start Time 1300    PT Stop Time 1345    PT Time Calculation (min) 45 min    Equipment Utilized During Treatment Gait belt    Activity Tolerance Patient tolerated treatment well;No increased pain    Behavior During Therapy WFL for tasks assessed/performed           Past Medical History:  Diagnosis Date  . Actinic keratosis   . Alcohol abuse   . APS (antiphospholipid syndrome) ([place])   . Basal cell carcinoma   . BPH (benign prostatic hyperplasia)   . CAD (coronary artery disease)   . Cellulitis   . Cervical spinal stenosis   . Chronic osteomyelitis ([place])   . CKD (chronic kidney disease)   . CKD (chronic kidney disease)   . Clostridium difficile diarrhea   . Depression   . Diplopia   . Fatigue   . GERD (gastroesophageal reflux disease)   . Hyperlipemia   . Hypertension   . Hypovitaminosis D   . IBS (irritable bowel syndrome)   . 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 Insomnia   . Left lumbosacral radiculopathy   . Moderate obstructive sleep apnea   . Osteomyelitis of foot ([place])   . PAD (peripheral artery disease) ([place])   . Pruritus   . RA (rheumatoid arthritis) ([place])   . Radiculopathy   . Restless legs syndrome   . RLS (restless legs syndrome)   . SLE (systemic lupus erythematosus related syndrome) ([place])   . 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 Stroke ([place])   . Toxic maculopathy of  both eyes     Past Surgical History:  Procedure Laterality Date  . CARDIAC CATHETERIZATION    . CAROTID ENDARTERECTOMY    . CERVICAL LAMINECTOMY    . CORONARY ANGIOPLASTY    . FRACTURE SURGERY    . fusion C5-6-7    . HERNIA REPAIR    . KNEE ARTHROSCOPY    . TONSILLECTOMY      There were no vitals filed for this visit.   Subjective Assessment - 01/12/20 1256    Subjective Pt reported he is doing well today, no complaints of pain. Pt with recent abrasion of L arm, bandaged at beginning of session.    Pertinent History Patient is a 74 y.o. male who presents to outpatient physical therapy with a referral for medical diagnosis of CVA. This patient's chief complaints consist of left hemiplegia and overall deconditioning leading to the following functional deficits: dependent for ADLs, IADLs, unable to transfer to car, dependent transfers at home with hoyer lift, unable to walk or stand without significant assistance, difficulty with W/C navigation..    Limitations Sitting;Lifting;Standing;House hold activities;Writing;Walking;Other (comment)    Diagnostic tests Brain MRI 11/01/2017: IMPRESSION: 1. Acute right ACA territory infarct as demonstrated on prior CT imaging. No intracranial hemorrhage or significant mass effect.  2. 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    Patient Stated Goals wants to be able to walk again    Currently in Pain? No/denies           TREATMENT:   Patient transferred squat pivot wheelchair to mat table, max A +2 with cues for hand placement and trunk position. Patient exhibits heavy posterior left lateral lean requiring increased assistance for safety;    He required mod A +2 for transition sit to supine;      Patient was dependent for scooting up in bed;       Patient hooklying:   Lumbar trunk rotation x5 reps each direction requiring mod VCs for increased ROM keeping LLE knee close to RLE   Hip flexion march x10 reps  each LE with visual cues to increase ROM, AAROM on LLE;   SLR hip flexion x10 reps RLE, x5 reps LLE AAROM;    Bridges with arms by side 3 sec hold x15 reps with cues for increased ROM and increased hold time with gluteal squeeze   SAQ bilaterally with assist for LLE to perform full ROM x15 ea side     Patient required min A +1 for transition supine to sidelying with cues for UE/LE positioning;     Patient required maX A (second attempted maxAx2)  for transition sidelying to sitting with cues for hand placement and trunk control;        Upon sitting, instructed patient in neutral weight shift: feet supported with mirror for visual cues for neutral weight shift  2 minutes of unsupported sitting (verbal/tactile cues intermittently)  -reaching with UE in various directions touching therapist hand x10-12 reps each UE (reaches to encourage anterior weight shift, midline and ipsilateral side  -forward bilateral reach with weighted ball (~2lbs) x5, max cueing  -rotation L and R with ball with at least minA and tactile cues for form x7, pt very fatigued by end of sitting  Patient required min A throughout all sitting for better postural control and weight shift;         Patient transferred squat pivot mat table to wheelchair with max A +2 with cues for hand placement and positioning.      Pt response/clinical impression: Pt demonstrated great motivation throughout session. Max verbal and visual cueing to maximize participation, muscle activation and technique/form. Pt fatigued quickly in unsupported sitting often needed at least minA to avoid posterior lean. The patient would benefit from further skilled PT intervention to continue to maximize function and QOL.      PT Education - 01/12/20 1256    Education Details LE strengthening, trunk control, positioning    Methods Explanation;Verbal cues    Comprehension Verbalized understanding;Returned demonstration;Verbal cues required;Need  further instruction;Tactile cues required            PT Short Term Goals - 08/18/19 1305      PT SHORT TERM GOAL #1   Title Be independent with initial home exercise program for self-management of symptoms.    Baseline HEP to be given at second session; advice to practice unsupported sitting at home (06/19/2018); 07/08/18: Performing at home, 6/29: needs assistance but is doing exercise program regularly; 08/22/18: adherent;    Time 2    Period Weeks    Status Achieved    Target Date 06/26/18             PT Long Term Goals - 11/10/19 1323  PT LONG TERM GOAL #1   Title Patient will complete all bed mobility with min A to improve functional independence for getting in and out of bed and adjusting in bed.     Baseline max A - total A reported by family (06/19/2018); 07/08/18: still requires maxA, dtr reports improvement in rolling since starting therapy; 6/23: min A to supervision in clinic; not doing at home right; 8/5: Pt's daughter states that his rolling has improved, but still has difficulty with all other bed mobility; 10/23/18: pts daughter states that she is doing about 35% of the work if he is in proper position, he does uses the bed rails to help roll;  using Hoyer for coming to sit EOB; 10/12: pts daughter states that she is doing about 30% of the work if he is in proper position, he does uses the bed rails to help roll, but it is improving;  using Hoyer for coming to sit EOB, 10/29: min A for sit to sidelying, rolling supervision, able to transition sidelying to sit with HHA;    Time 12    Period Weeks    Status Achieved    Target Date 02/02/20      PT LONG TERM GOAL #2   Title Patient will complete sit <> stand transfer chair to chair with Min A and LRAD to improve functional independence for household and community mobility.     Baseline supervision for STS, still requires elevated surface height to 27"    Time 12    Period Weeks    Status Achieved    Target Date 02/02/20       PT LONG TERM GOAL #3   Title Patient will navigate power w/c with min A x 100 feet to improve mobility for household and short community distances.    Baseline required total A (06/12/2018); able to roll 20 feet with min A (06/20/2018); 07/08/18: Pt can go approximately 10' without assist per family;  10/23/2018: pt's daughter states that his power WC mobility has become 100% better with the new hand control, slowed speed, and with practice; 10/12: pt's daughter reports that he can perform household WC mobility and limited community ambulation with minA    Time 12    Period Weeks    Status Achieved    Target Date 02/02/20      PT LONG TERM GOAL #4   Title Patient will complete car transfers W/C to family SUV with min A using LRAD to improve his abilty to participate in community activities.     Baseline unable to complete car transfers at all (06/12/2018); unable to complete car transfers at all (06/19/2018).; 07/08/18: Unable to perform at this time; 09/11/18: unable to perform at this time; 10/23/2018: unable to complete car transfers at all; 10/12:  unable to complete car transfers at all, 10/29: unable; 01/13/19: unable; 2/11: min-mod A +2, 3/10: min A+1    Time 12    Period Weeks    Status Achieved    Target Date 02/02/20      PT LONG TERM GOAL #5   Title Patient will ambulate at least 10 feet with min A using LRAD to improve mobility for household and community distances.    Baseline unable to take steps (06/12/2018); able to shift R leg forward and back with max A and RW at edge of plinth (06/19/2018); 07/08/18: unable to ambulate at this time. 09/11/18: unable to perform at this time; 10/23/2018: unable to perform at this time; 10/12:  unable to perform at this time, 10/29: unable at this time; 01/13/19: unable at this time, 2/11: min A to step with RLE, unable to step with LLE, 3/10: able to take 2 steps in parallel bars with min A for safety; 4/1: min A for walking 10 feet in parallel bars, 5/24: min A  walking 10 feet in parallel bars; 6/21: no change 7/14: no change    Time 12    Period Weeks    Status Partially Met    Target Date 02/02/20      PT LONG TERM GOAL #6   Title Patient will be able to safety navigate up/down ramp with power chair, exhibiting good safety awareness, mod I to safely enter/exit his home.    Baseline 2/11: supervision with intermittent min A;4/1: supervision; 5/24: supervision    Time 12    Period Weeks    Status Partially Met    Target Date 02/02/20      PT LONG TERM GOAL #7   Title Patient will increase functional reach test to >15 inches in sitting to exhibit improved sitting balance and positioning and reduce fall risk;    Baseline 07/30/18: 12 inches; 09/11/18: 10-12 inches; 10/23/18: 12-13 inch; 10/12: 14.5", 10/29: 15.5 inch    Time 4    Period Weeks    Status Achieved    Target Date 02/02/20      PT LONG TERM GOAL #8   Title Patient will increase LLE quad strength to 3+/5 to improve functional strength for standing and mobility;    Baseline 10/28: 2+/5; 01/13/19: 2+/5, 2/11: 3-/5, 04/16/19: 3-/5, 05/08/19: 3-/5, 5/24: 3-/5, 6/21: 3-/5, 7/14: 3-/5    Time 12    Period Weeks    Status Partially Met    Target Date 02/02/20      PT LONG TERM GOAL  #9   TITLE Patient will report no vertigo with provoking motions or positions in order to be able to perform ADLs and mobility tasks without symptoms.    Baseline 06/30/19: reports no dizziness in last week; 6/21: one episode this weekend; 7/14: experiencing one a week;    Time 12    Period Weeks    Status Partially Met    Target Date 02/02/20                 Plan - 01/12/20 1257    Clinical Impression Statement Pt demonstrated great motivation throughout session. Max verbal and visual cueing to maximize participation, muscle activation and technique/form. Pt fatigued quickly in unsupported sitting often needed at least minA to avoid posterior lean. The patient would benefit from further skilled PT  intervention to continue to maximize function and QOL.    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    Examination-Activity Limitations Bed Mobility;Bend;Sit;Toileting;Stand;Stairs;Lift;Transfers;Squat;Locomotion Level;Carry;Dressing;Hygiene/Grooming;Continence    Examination-Participation Restrictions Yard Work;Interpersonal Relationship;Community Activity    Stability/Clinical Decision Making Evolving/Moderate complexity    Rehab Potential Fair    PT Frequency 3x / week    PT Duration 12 weeks    PT Treatment/Interventions ADLs/Self Care Home Management;Electrical Stimulation;Moist Heat;Cryotherapy;Gait training;Stair training;Functional mobility training;Therapeutic exercise;Balance  training;Neuromuscular re-education;Cognitive remediation;Patient/family education;Orthotic Fit/Training;Wheelchair mobility training;Manual techniques;Passive range of motion;Energy conservation;Joint Manipulations;Canalith Repostioning;Therapeutic activities;Vestibular    PT Next Visit Plan Work on strength, sitting/standing balance, functional activities such as rolling, bed mobility, transfers; caregiver training    PT Home Exercise Plan no updates this session    Consulted and Agree with Plan of Care Patient;Family member/caregiver    Family Member Consulted DTR           Patient will benefit from skilled therapeutic intervention in order to improve the following deficits  and impairments:  Abnormal gait, Decreased activity tolerance, Decreased cognition, Decreased endurance, Decreased knowledge of use of DME, Decreased range of motion, Decreased skin integrity, Decreased strength, Impaired perceived functional ability, Impaired sensation, Impaired UE functional use, Improper body mechanics, Pain, Cardiopulmonary status limiting activity, Decreased balance, Decreased coordination, Decreased mobility, Difficulty walking, Impaired tone, Postural dysfunction, Dizziness  Visit Diagnosis: Muscle weakness (generalized)  Difficulty in walking, not elsewhere classified  Repeated falls  Hemiplegia and hemiparesis following cerebral infarction affecting left non-dominant side (HCC)  Other lack of coordination     Problem List Patient Active Problem List   Diagnosis Date Noted  . Sepsis ([place]) 10/18/2018    Lieutenant Diego PT, DPT 1:57 PM,01/12/20   [place] MAIN [facility] SERVICES [address] [place], [place], [address] Phone: [phone]   Fax:  [phone]  Name: Kenneth Summers MRN: [id number] Date of Birth: [birth year]

## 2020-01-14 ENCOUNTER — Encounter: Payer: Self-pay | Admitting: Physical Therapy

## 2020-01-14 ENCOUNTER — Other Ambulatory Visit: Payer: Self-pay

## 2020-01-14 ENCOUNTER — Ambulatory Visit: Payer: Medicare Other | Admitting: Physical Therapy

## 2020-01-14 DIAGNOSIS — R278 Other lack of coordination: Secondary | ICD-10-CM

## 2020-01-14 DIAGNOSIS — R296 Repeated falls: Secondary | ICD-10-CM

## 2020-01-14 DIAGNOSIS — R262 Difficulty in walking, not elsewhere classified: Secondary | ICD-10-CM

## 2020-01-14 DIAGNOSIS — M6281 Muscle weakness (generalized): Secondary | ICD-10-CM

## 2020-01-14 NOTE — Therapy (Signed)
[place] [facility]IN [facility] SERVICES [address] [place], [place], [address] Phone: [phone]   Fax:  [phone]  Physical Therapy Treatment  Patient Details  Name: Kenneth Summers MRN: [id number] Date of Birth: 1945/06/19 No data recorded  Encounter Date: 01/14/2020   PT End of Session - 01/14/20 1254    Visit Number [id number]    Number of Visits 252    Date for PT Re-Evaluation 02/02/20    Authorization Type Medicare reporting period starting 09/11/18; NO FOTO    Authorization Time Period goals last assessed 08/20/19    PT Start Time 1302    PT Stop Time 1345    PT Time Calculation (min) 43 min    Equipment Utilized During Treatment Gait belt    Activity Tolerance Patient tolerated treatment well;No increased pain    Behavior During Therapy WFL for tasks assessed/performed           Past Medical History:  Diagnosis Date  . Actinic keratosis   . Alcohol abuse   . APS (antiphospholipid syndrome) ([place])   . Basal cell carcinoma   . BPH (benign prostatic hyperplasia)   . CAD (coronary artery disease)   . Cellulitis   . Cervical spinal stenosis   . Chronic osteomyelitis ([place])   . CKD (chronic kidney disease)   . CKD (chronic kidney disease)   . Clostridium difficile diarrhea   . Depression   . Diplopia   . Fatigue   . GERD (gastroesophageal reflux disease)   . Hyperlipemia   . Hypertension   . Hypovitaminosis D   . IBS (irritable bowel syndrome)   . 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 Insomnia   . Left lumbosacral radiculopathy   . Moderate obstructive sleep apnea   . Osteomyelitis of foot ([place])   . PAD (peripheral artery disease) ([place])   . Pruritus   . RA (rheumatoid arthritis) ([place])   . Radiculopathy   . Restless legs syndrome   . RLS (restless legs syndrome)   . SLE (systemic lupus erythematosus related syndrome) ([place])   . 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 Stroke ([place])   . Toxic maculopathy of  both eyes     Past Surgical History:  Procedure Laterality Date  . CARDIAC CATHETERIZATION    . CAROTID ENDARTERECTOMY    . CERVICAL LAMINECTOMY    . CORONARY ANGIOPLASTY    . FRACTURE SURGERY    . fusion C5-6-7    . HERNIA REPAIR    . KNEE ARTHROSCOPY    . TONSILLECTOMY      There were no vitals filed for this visit.   Subjective Assessment - 01/14/20 1304    Subjective Patient reports doing well; He reports working on [employer] working on [employer] and leg lifts; He still has his left arm bandaged; Denies any pain;    Pertinent History Patient is a 74 y.o. male who presents to outpatient physical therapy with a referral for medical diagnosis of CVA. This patient's chief complaints consist of left hemiplegia and overall deconditioning leading to the following functional deficits: dependent for ADLs, IADLs, unable to transfer to car, dependent transfers at home with hoyer lift, unable to walk or stand without significant assistance, difficulty with W/C navigation..    Limitations Sitting;Lifting;Standing;House hold activities;Writing;Walking;Other (comment)    Diagnostic tests Brain MRI 11/01/2017: IMPRESSION: 1. Acute right ACA territory infarct as demonstrated on prior CT imaging. No intracranial hemorrhage or significant  mass effect. 2. 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    Patient Stated Goals wants to be able to walk again    Currently in Pain? No/denies                     TREATMENT:   Patient transferred squat pivot wheelchair to mat table, max A +2 with cues for hand placement and trunk position. Patient exhibits heavy posterior left lateral lean requiring increased assistance for safety;    He required mod A +2 for transition sit to supine;      Patient hooklying:   Lumbar trunk rotation x5 reps each direction requiring mod VCs for increased ROM keeping LLE knee close to RLE for increased LLE muscle activation;   Hip  flexion march x10 reps each LE with visual cues to increase ROM, AAROM on LLE;   SLR hip flexion x10 reps RLE, x5 reps LLE AAROM;  Patient required mod VCS to increase knee extension for better quad control; He had difficulty achieving knee extension on LLE due to quad weakness and instability;   Bridges with arms by side 3 sec hold x20 reps with cues for increased ROM and increased hold time with gluteal squeeze Advanced HEP- with instruction to work on pulling up pants in bridge position for increased patient participation with dressing;   Hip adduction ball squeeze 3 sec hold x15 reps with mod VCs to increase LLE hip adduction activation;   SAQ bilaterally with assist for LLE to perform full ROM 5 sec hold x5 reps ea side     Patient required min A +1 for transition supine to sidelying with cues for UE/LE positioning;   Also recommended patient increase participation with rolling at home during dressing;   Patient required mod A +2 for transition sidelying to sitting with cues for hand placement and trunk control;        Upon sitting, instructed patient in neutral weight shift: feet supported with mirror for visual cues for neutral weight shift  2 minutes of unsupported sitting (verbal/tactile cues intermittently)  -reaching with UE in various directions touching therapist hand x10-12 reps each UE (reaches to encourage anterior weight shift, midline and ipsilateral side   Patient required CGA-stand by assist throughout all sitting for better postural control and weight shift;    Assessed forward reach instructing patient to reach shoes. He is unable to fully touch toes while sitting but was able to reach towards right ankle/top of shoe. He reports fear of falling forward as limiting factor with forward reach;    Patient transferred squat pivot mat table to wheelchair with max A +2 with cues for hand placement and positioning.      Pt response/clinical impression: Pt  demonstrated great motivation throughout session. Max verbal and visual cueing to maximize participation, muscle activation and technique/form. Pt fatigued quickly in unsupported sitting exhibitng slouched posture and sacral sitting requiring mod VCS for anterior pelvic tilt and erect posture. He exhibited 2 episodes of posterior loss of balance requiring mod A +2 for postural correction. The patient would benefit from further skilled PT intervention to continue to maximize function and QOL.                 PT Education - 01/14/20 1253    Education Details LE strengthening, trunk control/positioning; HEP    Person(s) Educated Patient    Methods Explanation;Verbal cues    Comprehension Verbalized understanding;Returned demonstration;Verbal cues required;Need further  instruction            PT Short Term Goals - 08/18/19 1305      PT SHORT TERM GOAL #1   Title Be independent with initial home exercise program for self-management of symptoms.    Baseline HEP to be given at second session; advice to practice unsupported sitting at home (06/19/2018); 07/08/18: Performing at home, 6/29: needs assistance but is doing exercise program regularly; 08/22/18: adherent;    Time 2    Period Weeks    Status Achieved    Target Date 06/26/18             PT Long Term Goals - 11/10/19 1323      PT LONG TERM GOAL #1   Title Patient will complete all bed mobility with min A to improve functional independence for getting in and out of bed and adjusting in bed.     Baseline max A - total A reported by family (06/19/2018); 07/08/18: still requires maxA, dtr reports improvement in rolling since starting therapy; 6/23: min A to supervision in clinic; not doing at home right; 8/5: Pt's daughter states that his rolling has improved, but still has difficulty with all other bed mobility; 10/23/18: pts daughter states that she is doing about 35% of the work if he is in proper position, he does uses the bed  rails to help roll;  using Hoyer for coming to sit EOB; 10/12: pts daughter states that she is doing about 30% of the work if he is in proper position, he does uses the bed rails to help roll, but it is improving;  using Hoyer for coming to sit EOB, 10/29: min A for sit to sidelying, rolling supervision, able to transition sidelying to sit with HHA;    Time 12    Period Weeks    Status Achieved    Target Date 02/02/20      PT LONG TERM GOAL #2   Title Patient will complete sit <> stand transfer chair to chair with Min A and LRAD to improve functional independence for household and community mobility.     Baseline supervision for STS, still requires elevated surface height to 27"    Time 12    Period Weeks    Status Achieved    Target Date 02/02/20      PT LONG TERM GOAL #3   Title Patient will navigate power w/c with min A x 100 feet to improve mobility for household and short community distances.    Baseline required total A (06/12/2018); able to roll 20 feet with min A (06/20/2018); 07/08/18: Pt can go approximately 10' without assist per family;  10/23/2018: pt's daughter states that his power WC mobility has become 100% better with the new hand control, slowed speed, and with practice; 10/12: pt's daughter reports that he can perform household WC mobility and limited community ambulation with minA    Time 12    Period Weeks    Status Achieved    Target Date 02/02/20      PT LONG TERM GOAL #4   Title Patient will complete car transfers W/C to family SUV with min A using LRAD to improve his abilty to participate in community activities.     Baseline unable to complete car transfers at all (06/12/2018); unable to complete car transfers at all (06/19/2018).; 07/08/18: Unable to perform at this time; 09/11/18: unable to perform at this time; 10/23/2018: unable to complete car transfers at all; 10/12:  unable to complete car transfers at all, 10/29: unable; 01/13/19: unable; 2/11: min-mod A +2, 3/10: min A+1     Time 12    Period Weeks    Status Achieved    Target Date 02/02/20      PT LONG TERM GOAL #5   Title Patient will ambulate at least 10 feet with min A using LRAD to improve mobility for household and community distances.    Baseline unable to take steps (06/12/2018); able to shift R leg forward and back with max A and RW at edge of plinth (06/19/2018); 07/08/18: unable to ambulate at this time. 09/11/18: unable to perform at this time; 10/23/2018: unable to perform at this time; 10/12: unable to perform at this time, 10/29: unable at this time; 01/13/19: unable at this time, 2/11: min A to step with RLE, unable to step with LLE, 3/10: able to take 2 steps in parallel bars with min A for safety; 4/1: min A for walking 10 feet in parallel bars, 5/24: min A walking 10 feet in parallel bars; 6/21: no change 7/14: no change    Time 12    Period Weeks    Status Partially Met    Target Date 02/02/20      PT LONG TERM GOAL #6   Title Patient will be able to safety navigate up/down ramp with power chair, exhibiting good safety awareness, mod I to safely enter/exit his home.    Baseline 2/11: supervision with intermittent min A;4/1: supervision; 5/24: supervision    Time 12    Period Weeks    Status Partially Met    Target Date 02/02/20      PT LONG TERM GOAL #7   Title Patient will increase functional reach test to >15 inches in sitting to exhibit improved sitting balance and positioning and reduce fall risk;    Baseline 07/30/18: 12 inches; 09/11/18: 10-12 inches; 10/23/18: 12-13 inch; 10/12: 14.5", 10/29: 15.5 inch    Time 4    Period Weeks    Status Achieved    Target Date 02/02/20      PT LONG TERM GOAL #8   Title Patient will increase LLE quad strength to 3+/5 to improve functional strength for standing and mobility;    Baseline 10/28: 2+/5; 01/13/19: 2+/5, 2/11: 3-/5, 04/16/19: 3-/5, 05/08/19: 3-/5, 5/24: 3-/5, 6/21: 3-/5, 7/14: 3-/5    Time 12    Period Weeks    Status Partially Met    Target Date  02/02/20      PT LONG TERM GOAL  #9   TITLE Patient will report no vertigo with provoking motions or positions in order to be able to perform ADLs and mobility tasks without symptoms.    Baseline 06/30/19: reports no dizziness in last week; 6/21: one episode this weekend; 7/14: experiencing one a week;    Time 12    Period Weeks    Status Partially Met    Target Date 02/02/20                 Plan - 01/15/20 1018    Clinical Impression Statement Patient motivated and participated well within session. He continues to require max A +2 for transfers. patient does exhibit increased right lateral trunk lean this session in sitting requiring min-mod VCs for trunk reorientation to midline. He was instructed in advanced LE strengthening exercise. He continues to have weakness on LLE requiring AAROM for most tasks. Patient instructed in advanced HEP working on pulling up pants  in bridge position and participate more with rolling. He does report increased fatigue at end of session. He was able to exhibit better sitting posture and trunk control following exercise compared to start of session. Patient would benefit from additional skilled PT Intervention to improve strength, balance and mobility;    Personal Factors and Comorbidities Age;Comorbidity 3+    Comorbidities Relevant past medical history and comorbidities include long term steroid use for lupus, CKD, chronic osteomyelitis, cervical spine stenosis, BPH, APS, alcohol abuse, peripheral artery disease, depression, GERD, obstructive sleep apnea, HTN, IBS, IgA deficiency, lumbar radiculopathy, RA, Stroke, toxic maculopathy in both eyes, systemic lupus erythematosus related syndrome, cardiac catheterization, carotid endarterectomy, cervical laminectomy, coronary angioplasty, Fusion C5-C7, knee arthroplasty, lumbar surgery.    Examination-Activity Limitations Bed Mobility;Bend;Sit;Toileting;Stand;Stairs;Lift;Transfers;Squat;Locomotion  Level;Carry;Dressing;Hygiene/Grooming;Continence    Examination-Participation Restrictions Yard Work;Interpersonal Relationship;Community Activity    Stability/Clinical Decision Making Evolving/Moderate complexity    Rehab Potential Fair    PT Frequency 3x / week    PT Duration 12 weeks    PT Treatment/Interventions ADLs/Self Care Home Management;Electrical Stimulation;Moist Heat;Cryotherapy;Gait training;Stair training;Functional mobility training;Therapeutic exercise;Balance training;Neuromuscular re-education;Cognitive remediation;Patient/family education;Orthotic Fit/Training;Wheelchair mobility training;Manual techniques;Passive range of motion;Energy conservation;Joint Manipulations;Canalith Repostioning;Therapeutic activities;Vestibular    PT Next Visit Plan Work on strength, sitting/standing balance, functional activities such as rolling, bed mobility, transfers; caregiver training    PT Home Exercise Plan no updates this session    Consulted and Agree with Plan of Care Patient;Family member/caregiver    Family Member Consulted DTR           Patient will benefit from skilled therapeutic intervention in order to improve the following deficits and impairments:  Abnormal gait,Decreased activity tolerance,Decreased cognition,Decreased endurance,Decreased knowledge of use of DME,Decreased range of motion,Decreased skin integrity,Decreased strength,Impaired perceived functional ability,Impaired sensation,Impaired UE functional use,Improper body mechanics,Pain,Cardiopulmonary status limiting activity,Decreased balance,Decreased coordination,Decreased mobility,Difficulty walking,Impaired tone,Postural dysfunction,Dizziness  Visit Diagnosis: Muscle weakness (generalized)  Other lack of coordination  Difficulty in walking, not elsewhere classified  Repeated falls     Problem List Patient Active Problem List   Diagnosis Date Noted  . Sepsis ([place]) 10/18/2018    Vitalia Stough PT,  DPT 01/15/2020, 10:21 AM  [place] MAIN [facility] SERVICES [address] [place], [place], [address] Phone: [phone]   Fax:  [phone]  Name: ZANNIE RUNKLE MRN: [id number] Date of Birth: 07/07/1945

## 2020-01-15 ENCOUNTER — Encounter: Payer: Self-pay | Admitting: Occupational Therapy

## 2020-01-15 ENCOUNTER — Other Ambulatory Visit: Payer: Self-pay

## 2020-01-15 ENCOUNTER — Ambulatory Visit: Payer: Medicare Other | Admitting: Occupational Therapy

## 2020-01-15 ENCOUNTER — Encounter: Payer: Self-pay | Admitting: Physical Therapy

## 2020-01-15 ENCOUNTER — Ambulatory Visit: Payer: Medicare Other | Admitting: Physical Therapy

## 2020-01-15 DIAGNOSIS — M6281 Muscle weakness (generalized): Secondary | ICD-10-CM

## 2020-01-15 DIAGNOSIS — R278 Other lack of coordination: Secondary | ICD-10-CM

## 2020-01-15 DIAGNOSIS — R262 Difficulty in walking, not elsewhere classified: Secondary | ICD-10-CM

## 2020-01-15 DIAGNOSIS — R296 Repeated falls: Secondary | ICD-10-CM

## 2020-01-15 NOTE — Patient Instructions (Signed)
Access Code: [email or web address] URL: [email or web address] Date: 01/15/2020 Prepared by: Blanche East  Exercises Supine Heel Slide - 1 x daily - 7 x weekly - 2 sets - 10 reps

## 2020-01-15 NOTE — Therapy (Signed)
[facility] MAIN [facility] SERVICES [address] [place], [place], [address] Phone: [phone]   Fax:  [phone]  Physical Therapy Treatment  Patient Details  Name: Kenneth Summers MRN: [id number] Date of Birth: 05-17-1945 No data recorded  Encounter Date: 01/15/2020   PT End of Session - 01/15/20 1302    Visit Number [id number]    Number of Visits 252    Date for PT Re-Evaluation 02/02/20    Authorization Type Medicare reporting period starting 09/11/18; NO FOTO    Authorization Time Period goals last assessed 08/20/19    PT Start Time 1345    PT Stop Time 1430    PT Time Calculation (min) 45 min    Equipment Utilized During Treatment Gait belt    Activity Tolerance Patient tolerated treatment well;No increased pain    Behavior During Therapy WFL for tasks assessed/performed           Past Medical History:  Diagnosis Date  . Actinic keratosis   . Alcohol abuse   . APS (antiphospholipid syndrome) (HCC)   . Basal cell carcinoma   . BPH (benign prostatic hyperplasia)   . CAD (coronary artery disease)   . Cellulitis   . Cervical spinal stenosis   . Chronic osteomyelitis (HCC)   . CKD (chronic kidney disease)   . CKD (chronic kidney disease)   . Clostridium difficile diarrhea   . Depression   . Diplopia   . Fatigue   . GERD (gastroesophageal reflux disease)   . Hyperlipemia   . Hypertension   . Hypovitaminosis D   . IBS (irritable bowel syndrome)   . IgA deficiency (HCC)   . Insomnia   . Left lumbosacral radiculopathy   . Moderate obstructive sleep apnea   . Osteomyelitis of foot (HCC)   . PAD (peripheral artery disease) (HCC)   . Pruritus   . RA (rheumatoid arthritis) (HCC)   . Radiculopathy   . Restless legs syndrome   . RLS (restless legs syndrome)   . SLE (systemic lupus erythematosus related syndrome) (HCC)   . 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 Stroke (HCC)   . Toxic maculopathy of  both eyes     Past Surgical History:  Procedure Laterality Date  . CARDIAC CATHETERIZATION    . CAROTID ENDARTERECTOMY    . CERVICAL LAMINECTOMY    . CORONARY ANGIOPLASTY    . FRACTURE SURGERY    . fusion C5-6-7    . HERNIA REPAIR    . KNEE ARTHROSCOPY    . TONSILLECTOMY      There were no vitals filed for this visit.   Subjective Assessment - 01/15/20 1359    Subjective Patient reports doing well; He reports he was able to lean forward easier today and has been working on pulling his pants up at home.    Pertinent History Patient is a 74 y.o. male who presents to outpatient physical therapy with a referral for medical diagnosis of CVA. This patient's chief complaints consist of left hemiplegia and overall deconditioning leading to the following functional deficits: dependent for ADLs, IADLs, unable to transfer to car, dependent transfers at home with hoyer lift, unable to walk or stand without significant assistance, difficulty with W/C navigation..    Limitations Sitting;Lifting;Standing;House hold activities;Writing;Walking;Other (comment)    Diagnostic tests Brain MRI 11/01/2017: IMPRESSION: 1. Acute right ACA territory infarct as demonstrated on prior CT imaging. No intracranial hemorrhage or significant  mass effect. 2. 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    Patient Stated Goals wants to be able to walk again    Currently in Pain? No/denies    Multiple Pain Sites No                 TREATMENT:   Patient transferred squat pivot wheelchair to mat table, mod A +2 with cues for hand placement and trunk position. Patient exhibits little to no  posterior left lateral lean exhibiting better trunk control;  Sitting edge of mat table: Able to sit unsupported with feet on floor with neutral weight shift with visual cues for correction:  Seated: alternate UE lift x5 reps each with stand by assist, with visual and verbal cues for erect  sitting posture;   He required mod A +2 for transition sit to supine;    Patient hooklying:   Lumbar trunk rotation x5 reps each direction requiring mod VCs for increased ROM keeping LLE knee close to RLE for increased LLE muscle activation;   Hip flexion march x10 reps each LE with visual cues to increase ROM, AAROM on LLE;   Heel slides x10 reps each LE with AAROM on LLE for end range of motion; Patient required mod VCs for increased ROM;   Bridges with arms by side 3 sec hold x10reps with cues for increased ROM and increased hold time with gluteal squeeze Single leg bridge (in figure 4 position) x10 reps each LE with min A, partial ROM;   Patient required min A +1 for transition supine to sidelying with cues for UE/LE positioning;   Patient required min A +26for transition sidelying to sitting with cues for hand placement and trunk control;     Upon sitting, instructed patient in neutral weight shift:feet supported with cues for forward weight shift.  Patient able to reach down to untie shoes with CGA for safety reporting feeling fearful of falling but not losing balance;  Patient required CGA-stand by assist throughout all sitting for better postural control and weight shift;    Patient transferred squat pivot mat table to wheelchair with maxA +2with cues for hand placement and positioning.    Pt response/clinical impression: Pt demonstrated great motivation throughout session. He exhibits better trunk control this session with less posterior lean and less lateral lean. He was also able to exhibit better muscle activation in LLE, although does require AAROM for full ROM. Patient fatigues quickly requiring short rest break. Reinforced HEP, advancing with supine heel slides for increased LE strengthening;                       PT Education - 01/15/20 1302    Education Details LE strengthening, transfers, trunk control, HEP     Person(s) Educated Patient;Child(ren)    Methods Explanation;Demonstration;Verbal cues    Comprehension Verbalized understanding;Returned demonstration;Verbal cues required;Need further instruction            PT Short Term Goals - 08/18/19 1305      PT SHORT TERM GOAL #1   Title Be independent with initial home exercise program for self-management of symptoms.    Baseline HEP to be given at second session; advice to practice unsupported sitting at home (06/19/2018); 07/08/18: Performing at home, 6/29: needs assistance but is doing exercise program regularly; 08/22/18: adherent;    Time 2    Period Weeks    Status Achieved    Target Date 06/26/18  PT Long Term Goals - 11/10/19 1323      PT LONG TERM GOAL #1   Title Patient will complete all bed mobility with min A to improve functional independence for getting in and out of bed and adjusting in bed.     Baseline max A - total A reported by family (06/19/2018); 07/08/18: still requires maxA, dtr reports improvement in rolling since starting therapy; 6/23: min A to supervision in clinic; not doing at home right; 8/5: Pt's daughter states that his rolling has improved, but still has difficulty with all other bed mobility; 10/23/18: pts daughter states that she is doing about 35% of the work if he is in proper position, he does uses the bed rails to help roll;  using Hoyer for coming to sit EOB; 10/12: pts daughter states that she is doing about 30% of the work if he is in proper position, he does uses the bed rails to help roll, but it is improving;  using Hoyer for coming to sit EOB, 10/29: min A for sit to sidelying, rolling supervision, able to transition sidelying to sit with HHA;    Time 12    Period Weeks    Status Achieved    Target Date 02/02/20      PT LONG TERM GOAL #2   Title Patient will complete sit <> stand transfer chair to chair with Min A and LRAD to improve functional independence for household and community  mobility.     Baseline supervision for STS, still requires elevated surface height to 27"    Time 12    Period Weeks    Status Achieved    Target Date 02/02/20      PT LONG TERM GOAL #3   Title Patient will navigate power w/c with min A x 100 feet to improve mobility for household and short community distances.    Baseline required total A (06/12/2018); able to roll 20 feet with min A (06/20/2018); 07/08/18: Pt can go approximately 10' without assist per family;  10/23/2018: pt's daughter states that his power WC mobility has become 100% better with the new hand control, slowed speed, and with practice; 10/12: pt's daughter reports that he can perform household WC mobility and limited community ambulation with minA    Time 12    Period Weeks    Status Achieved    Target Date 02/02/20      PT LONG TERM GOAL #4   Title Patient will complete car transfers W/C to family SUV with min A using LRAD to improve his abilty to participate in community activities.     Baseline unable to complete car transfers at all (06/12/2018); unable to complete car transfers at all (06/19/2018).; 07/08/18: Unable to perform at this time; 09/11/18: unable to perform at this time; 10/23/2018: unable to complete car transfers at all; 10/12:  unable to complete car transfers at all, 10/29: unable; 01/13/19: unable; 2/11: min-mod A +2, 3/10: min A+1    Time 12    Period Weeks    Status Achieved    Target Date 02/02/20      PT LONG TERM GOAL #5   Title Patient will ambulate at least 10 feet with min A using LRAD to improve mobility for household and community distances.    Baseline unable to take steps (06/12/2018); able to shift R leg forward and back with max A and RW at edge of plinth (06/19/2018); 07/08/18: unable to ambulate at this time. 09/11/18: unable to  perform at this time; 10/23/2018: unable to perform at this time; 10/12: unable to perform at this time, 10/29: unable at this time; 01/13/19: unable at this time, 2/11: min A to step  with RLE, unable to step with LLE, 3/10: able to take 2 steps in parallel bars with min A for safety; 4/1: min A for walking 10 feet in parallel bars, 5/24: min A walking 10 feet in parallel bars; 6/21: no change 7/14: no change    Time 12    Period Weeks    Status Partially Met    Target Date 02/02/20      PT LONG TERM GOAL #6   Title Patient will be able to safety navigate up/down ramp with power chair, exhibiting good safety awareness, mod I to safely enter/exit his home.    Baseline 2/11: supervision with intermittent min A;4/1: supervision; 5/24: supervision    Time 12    Period Weeks    Status Partially Met    Target Date 02/02/20      PT LONG TERM GOAL #7   Title Patient will increase functional reach test to >15 inches in sitting to exhibit improved sitting balance and positioning and reduce fall risk;    Baseline 07/30/18: 12 inches; 09/11/18: 10-12 inches; 10/23/18: 12-13 inch; 10/12: 14.5", 10/29: 15.5 inch    Time 4    Period Weeks    Status Achieved    Target Date 02/02/20      PT LONG TERM GOAL #8   Title Patient will increase LLE quad strength to 3+/5 to improve functional strength for standing and mobility;    Baseline 10/28: 2+/5; 01/13/19: 2+/5, 2/11: 3-/5, 04/16/19: 3-/5, 05/08/19: 3-/5, 5/24: 3-/5, 6/21: 3-/5, 7/14: 3-/5    Time 12    Period Weeks    Status Partially Met    Target Date 02/02/20      PT LONG TERM GOAL  #9   TITLE Patient will report no vertigo with provoking motions or positions in order to be able to perform ADLs and mobility tasks without symptoms.    Baseline 06/30/19: reports no dizziness in last week; 6/21: one episode this weekend; 7/14: experiencing one a week;    Time 12    Period Weeks    Status Partially Met    Target Date 02/02/20                 Plan - 01/15/20 1557    Clinical Impression Statement Patient motivated and participated well within session. he exhibits better trunk control this session with less posterior lean, being  mod A +2 for squat pivot transfer at start of session. he also was able to sit on mat table with feet supported, back unsupported with less lateral/posterior lean and better erect posture. Patient instructed in advanced LE strengthening exercise. He does require AAROM on LLE for full ROM but was able to initiate LLE movement better today compared to previous sessions. He does fatigue quickly and had a harder time transferring back to wheelchair at end of session. He would benefit from additional skilled PT Intervention to improve strength, balance and mobility; Overall patient is improving reporting that he is helping with ADLs more at home.    Personal Factors and Comorbidities Age;Comorbidity 3+    Comorbidities Relevant past medical history and comorbidities include long term steroid use for lupus, CKD, chronic osteomyelitis, cervical spine stenosis, BPH, APS, alcohol abuse, peripheral artery disease, depression, GERD, obstructive sleep apnea, HTN, IBS, IgA deficiency, lumbar  radiculopathy, RA, Stroke, toxic maculopathy in both eyes, systemic lupus erythematosus related syndrome, cardiac catheterization, carotid endarterectomy, cervical laminectomy, coronary angioplasty, Fusion C5-C7, knee arthroplasty, lumbar surgery.    Examination-Activity Limitations Bed Mobility;Bend;Sit;Toileting;Stand;Stairs;Lift;Transfers;Squat;Locomotion Level;Carry;Dressing;Hygiene/Grooming;Continence    Examination-Participation Restrictions Yard Work;Interpersonal Relationship;Community Activity    Stability/Clinical Decision Making Evolving/Moderate complexity    Rehab Potential Fair    PT Frequency 3x / week    PT Duration 12 weeks    PT Treatment/Interventions ADLs/Self Care Home Management;Electrical Stimulation;Moist Heat;Cryotherapy;Gait training;Stair training;Functional mobility training;Therapeutic exercise;Balance training;Neuromuscular re-education;Cognitive remediation;Patient/family education;Orthotic  Fit/Training;Wheelchair mobility training;Manual techniques;Passive range of motion;Energy conservation;Joint Manipulations;Canalith Repostioning;Therapeutic activities;Vestibular    PT Next Visit Plan Work on strength, sitting/standing balance, functional activities such as rolling, bed mobility, transfers; caregiver training    PT Home Exercise Plan no updates this session    Consulted and Agree with Plan of Care Patient;Family member/caregiver    Family Member Consulted DTR           Patient will benefit from skilled therapeutic intervention in order to improve the following deficits and impairments:  Abnormal gait,Decreased activity tolerance,Decreased cognition,Decreased endurance,Decreased knowledge of use of DME,Decreased range of motion,Decreased skin integrity,Decreased strength,Impaired perceived functional ability,Impaired sensation,Impaired UE functional use,Improper body mechanics,Pain,Cardiopulmonary status limiting activity,Decreased balance,Decreased coordination,Decreased mobility,Difficulty walking,Impaired tone,Postural dysfunction,Dizziness  Visit Diagnosis: Muscle weakness (generalized)  Other lack of coordination  Difficulty in walking, not elsewhere classified  Repeated falls     Problem List Patient Active Problem List   Diagnosis Date Noted  . Sepsis ([place]) 10/18/2018    Fulton Merry PT, DPT 01/15/2020, 3:59 PM  [place] MAIN [facility] SERVICES [address] [place], [place], [address] Phone: [phone]   Fax:  [phone]  Name: MY MADARIAGA MRN: [id number] Date of Birth: 1945/04/12

## 2020-01-15 NOTE — Therapy (Signed)
[place] MAIN [facility] SERVICES [address] [place], [place], [address] Phone: [phone]   Fax:  [phone]  Occupational Therapy Treatment  Patient Details  Name: Kenneth Summers MRN: [id number] Date of Birth: 03/08/1945 No data recorded  Encounter Date: 01/15/2020   OT End of Session - 01/15/20 1505    Visit Number 158    Number of Visits 181    Date for OT Re-Evaluation 03/01/20    Authorization Type Progress report period starting 07/21/2019    OT Start Time 1300    OT Stop Time 1345    OT Time Calculation (min) 45 min    Activity Tolerance Patient tolerated treatment well    Behavior During Therapy [facility] for tasks assessed/performed           Past Medical History:  Diagnosis Date  . Actinic keratosis   . Alcohol abuse   . APS (antiphospholipid syndrome) ([place])   . Basal cell carcinoma   . BPH (benign prostatic hyperplasia)   . CAD (coronary artery disease)   . Cellulitis   . Cervical spinal stenosis   . Chronic osteomyelitis ([place])   . CKD (chronic kidney disease)   . CKD (chronic kidney disease)   . Clostridium difficile diarrhea   . Depression   . Diplopia   . Fatigue   . GERD (gastroesophageal reflux disease)   . Hyperlipemia   . Hypertension   . Hypovitaminosis D   . IBS (irritable bowel syndrome)   . 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 Insomnia   . Left lumbosacral radiculopathy   . Moderate obstructive sleep apnea   . Osteomyelitis of foot ([place])   . PAD (peripheral artery disease) ([place])   . Pruritus   . RA (rheumatoid arthritis) ([place])   . Radiculopathy   . Restless legs syndrome   . RLS (restless legs syndrome)   . SLE (systemic lupus erythematosus related syndrome) ([place])   . 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 Stroke ([place])   . Toxic maculopathy of both eyes     Past Surgical History:  Procedure Laterality Date  . CARDIAC CATHETERIZATION    . CAROTID ENDARTERECTOMY    .  CERVICAL LAMINECTOMY    . CORONARY ANGIOPLASTY    . FRACTURE SURGERY    . fusion C5-6-7    . HERNIA REPAIR    . KNEE ARTHROSCOPY    . TONSILLECTOMY      There were no vitals filed for this visit.   Subjective Assessment - 01/15/20 1505    Subjective  Pt. reports that his left hand is sore today    Patient is accompanied by: Family member    Pertinent History Pt. is a 74 y.o. 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 Pt. PMHx includes: Multiple Falls, Lupus, DJD, Renal Abscess, CVA. Pt. resides with his wife. Pt.'s wife and daughter assist with ADLs. Pt. has caregivers in  for 2 hours a day, 6 days a week. Pt. received Rehab services in acute care, at SNF for STR, and [place] services. Pt. is retired from [employer] for [employer] and [employer].    Currently in Pain? No/denies          Self-care:  Pt. worked on UE dressing skills donning his winter jacket. Pt. required mod assist to donn his jacket, with verbal cues and assist to initiate threading his LUE. Pt. Required  increased time, and step by step cues for  instruction throughout the task. Pt. required maxA to manage his zipper. Pt. had difficulty engaging his LUE when manipulating the zipper.  Pt. reports that his left hand is sore today, and presented with limited engagement of it during when performing zipping. Pt. required increased cues for each step of the task. Pt. continues to benefit from additional work on improving UE dressing skills. Pt. continues to work on improving UE strength, [facility] skills, and visual perceptual skills in order to improve,  And maximize independence with ADLs, and IADL tasks.                     OT Education - 01/15/20 1505    Education Details BUE ther. ex    Person(s) Educated Patient    Methods Explanation;Demonstration    Comprehension Returned demonstration;Tactile cues required;Verbalized understanding;Need further instruction                OT Long Term Goals - 12/08/19 1437      OT LONG TERM GOAL #1   Title Pt. will increase left shoulder flexion AROM by 10 degrees to access cabinet/shelf.    Baseline Pt. continues to progress with left shoulder ROM .Shouler flexion 130, and continues to work on reaching with increasing emphasis on flexing trunk to improve functional reach.  07/21/19- shoulder flexion to 130 with effort    Time 12    Period Weeks    Status Not Met    Target Date 03/01/20      OT LONG TERM GOAL #2   Title Pt. will donn a shirt with Supervision.    Baseline Pt. continues to require MinA donning a zip down jacket with consistent cues to initiate the correct direction of the jacket.  MinA for donning a T-shirt with assist to roll side to side to pull the shirt down over his back. 07/21/19 continues to require min assist at times for shirt/jacket    Time 12    Period Weeks    Status On-going    Target Date 03/01/20      OT LONG TERM GOAL #3   Title Pt. will require ModA to perform LE dressing    Baseline MaxA left, ModA with the right. Pt. has sores on his ankle, and feet. Pt.'s daughter is assisting.    Time 12    Period Weeks    Status Deferred    Target Date 03/02/19      OT LONG TERM GOAL #4   Title Pt. will improve left grip strength by 10# to be able to open a jar/container.    Baseline Pt. continues to present with limited left hand grip strength. Pt. continues to work towards opening containers, and bottles.    Time 12    Period Weeks    Status On-going    Target Date 03/02/19      OT LONG TERM GOAL #5   Title Pt. will improve left hand [facility] skills to be able to assist with buttoning/zipping.    Baseline Pt. has improved overall donning a jacket, and  manipulating the zipper on his jacket, however continues to require practice. Pt. continues to work on improving bilateral [facility] skills for buttoning/zipping.  07/21/19 improving with buttons and zippers.    Time 12    Period Weeks     Status On-going    Target Date 03/01/20      OT LONG TERM GOAL #6  Title Pt. will demonstrate visual conmpensatory strategies for 100% of the time during ADLs    Baseline Pt.continues to require intermittent cues cues for left sides awareness.    Time 12    Period Weeks    Status Partially Met    Target Date 03/01/20      OT LONG TERM GOAL #7   Title Pt. will prepare a simple cold snack from w/c with supervision using cognitive compensatory strategies 100% of the time.    Baseline Pt. has difficulty with getting the w/c close enough to access the refrigerator    Time 12    Period Weeks    Status Deferred      OT LONG TERM GOAL #8   Title Pt. will accurately identify potential safety hazard using good safety awareness, and judgement 100% for ADLs, and IADLs.    Baseline Pt. continues to require cues    Time 12    Period Weeks    Status On-going    Target Date 03/01/20      OT LONG TERM GOAL  #9   TITLE Pt. will  navigate his w/c around obstacles with Supervision and 100% accuracy    Baseline Pt. continues to require cuing for obstacles on the left, however requires less cues overall. Pt. requires significant cues to engage his left UE during tasks. 07/21/2019 continues to improve with decreased assist at times    Time 12    Period Weeks    Status On-going    Target Date 03/01/20      OT LONG TERM GOAL  #10   TITLE Pt. will independently be able  to reach to place items into cabinetry, and closets.    Baseline Pt. BUE strength is improving. UE functional reaching conitnues to be limited.    Time 12    Period Weeks    Status On-going    Target Date 03/01/20      OT LONG TERM GOAL  #11   TITLE Pt. will increase BUE strength by 8mm grades in preparation for ADL transfers.    Baseline Pt. in improveing BUE strength, and  is able to tolertae increased resistive weights. Pt. continues to work towards improving overall strength.    Time 12    Period Weeks    Status On-going     Target Date 03/01/20      OT LONG TERM GOAL  #12   TITLE Pt. will improve LUE functional reaching with the left engaging the trunk 100% of the time during ADL tasks with minimal cues.    Baseline Pt. conitnues to improve with LUE functional reaching. Fewer cues are required to engage his trunk for extended reaching.    Time 12    Period Weeks    Status On-going    Target Date 03/01/20                 Plan - 01/15/20 1506    Clinical Impression Statement Pt. reports that his left hand is sore today, and presented with limited engagement of it during when performing zipping. Pt. required increased cues for each step of the task. Pt. continues to benefit from additional work on improving UE dressing skills. Pt. continues to work on improving UE strength, [facility] skills, and visual perceptual skills in order to improve,  And maximize independence with ADLs, and IADL tasks.   OT Occupational Profile and History Problem Focused Assessment - Including review of records relating to presenting problem  Occupational performance deficits (Please refer to evaluation for details): ADL's    Body Structure / Function / Physical Skills UE functional use;Coordination;FMC;Dexterity;Strength;ROM    Cognitive Skills Attention;Memory;Emotional;Problem Solve;Safety Awareness    Rehab Potential Good    Clinical Decision Making Several treatment options, min-mod task modification necessary    Comorbidities Affecting Occupational Performance: Presence of comorbidities impacting occupational performance    Modification or Assistance to Complete Evaluation  No modification of tasks or assist necessary to complete eval    OT Frequency 2x / week    OT Duration 12 weeks    OT Treatment/Interventions Self-care/ADL training;DME and/or AE instruction;Therapeutic exercise;Therapeutic activities;Moist Heat;Cognitive remediation/compensation;Neuromuscular education;Visual/perceptual remediation/compensation;Coping  strategies training;Patient/family education;Passive range of motion;Psychosocial skills training;Energy conservation;Functional Mobility Training    Consulted and Agree with Plan of Care Patient;Family member/caregiver           Patient will benefit from skilled therapeutic intervention in order to improve the following deficits and impairments:   Body Structure / Function / Physical Skills: UE functional use,Coordination,FMC,Dexterity,Strength,ROM Cognitive Skills: Attention,Memory,Emotional,Problem Solve,Safety Awareness     Visit Diagnosis: Muscle weakness (generalized)  Other lack of coordination    Problem List Patient Active Problem List   Diagnosis Date Noted  . Sepsis ([place]) 10/18/2018    Harrel Carina, MS, OTR/L 01/15/2020, 3:08 PM  [place] MAIN [facility] SERVICES [address] [place], [place], [address] Phone: [phone]   Fax:  [phone]  Name: Kenneth Summers MRN: [id number] Date of Birth: 01-Jul-1945

## 2020-01-15 NOTE — Patient Instructions (Signed)
ADLs to work on at home:  1- work on participating more with rolling during dressing reaching with UE and pulling self to side  2- work on pulling up pants when in bridge position for increased dressing independence.

## 2020-01-19 ENCOUNTER — Ambulatory Visit: Payer: Medicare Other | Admitting: Occupational Therapy

## 2020-01-19 ENCOUNTER — Ambulatory Visit: Payer: Medicare Other | Admitting: Physical Therapy

## 2020-01-19 ENCOUNTER — Other Ambulatory Visit: Payer: Self-pay

## 2020-01-19 ENCOUNTER — Encounter: Payer: Self-pay | Admitting: Physical Therapy

## 2020-01-19 ENCOUNTER — Encounter: Payer: Self-pay | Admitting: Occupational Therapy

## 2020-01-19 DIAGNOSIS — M6281 Muscle weakness (generalized): Secondary | ICD-10-CM

## 2020-01-19 DIAGNOSIS — R296 Repeated falls: Secondary | ICD-10-CM

## 2020-01-19 DIAGNOSIS — R278 Other lack of coordination: Secondary | ICD-10-CM

## 2020-01-19 DIAGNOSIS — R262 Difficulty in walking, not elsewhere classified: Secondary | ICD-10-CM

## 2020-01-19 NOTE — Therapy (Signed)
[place] MAIN [facility] SERVICES [address] [place], [place], [address] Phone: [phone]   Fax:  [phone]  Physical Therapy Treatment  Patient Details  Name: Kenneth Summers MRN: [id number] Date of Birth: [birth year] No data recorded  Encounter Date: 01/19/2020   PT End of Session - 01/19/20 1258    Visit Number [id number]    Number of Visits 252    Date for PT Re-Evaluation 02/02/20    Authorization Type Medicare reporting period starting 09/11/18; NO FOTO    Authorization Time Period goals last assessed 08/20/19    PT Start Time 1302    PT Stop Time 1345    PT Time Calculation (min) 43 min    Equipment Utilized During Treatment Gait belt    Activity Tolerance Patient tolerated treatment well;No increased pain    Behavior During Therapy WFL for tasks assessed/performed           Past Medical History:  Diagnosis Date  . Actinic keratosis   . Alcohol abuse   . APS (antiphospholipid syndrome) ([place])   . Basal cell carcinoma   . BPH (benign prostatic hyperplasia)   . CAD (coronary artery disease)   . Cellulitis   . Cervical spinal stenosis   . Chronic osteomyelitis ([place])   . CKD (chronic kidney disease)   . CKD (chronic kidney disease)   . Clostridium difficile diarrhea   . Depression   . Diplopia   . Fatigue   . GERD (gastroesophageal reflux disease)   . Hyperlipemia   . Hypertension   . Hypovitaminosis D   . IBS (irritable bowel syndrome)   . 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 Insomnia   . Left lumbosacral radiculopathy   . Moderate obstructive sleep apnea   . Osteomyelitis of foot ([place])   . PAD (peripheral artery disease) ([place])   . Pruritus   . RA (rheumatoid arthritis) ([place])   . Radiculopathy   . Restless legs syndrome   . RLS (restless legs syndrome)   . SLE (systemic lupus erythematosus related syndrome) ([place])   . 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 Stroke ([place])   . Toxic maculopathy  of both eyes     Past Surgical History:  Procedure Laterality Date  . CARDIAC CATHETERIZATION    . CAROTID ENDARTERECTOMY    . CERVICAL LAMINECTOMY    . CORONARY ANGIOPLASTY    . FRACTURE SURGERY    . fusion C5-6-7    . HERNIA REPAIR    . KNEE ARTHROSCOPY    . TONSILLECTOMY      There were no vitals filed for this visit.   Subjective Assessment - 01/19/20 1315    Subjective Patient reports doing well; He reports no new changes this weekend; His daughter reports he was able to do more ADLs easier in the bed with his shoes on compared to shoes off.    Pertinent History Patient is a 74 y.o. male who presents to outpatient physical therapy with a referral for medical diagnosis of CVA. This patient's chief complaints consist of left hemiplegia and overall deconditioning leading to the following functional deficits: dependent for ADLs, IADLs, unable to transfer to car, dependent transfers at home with hoyer lift, unable to walk or stand without significant assistance, difficulty with W/C navigation..    Limitations Sitting;Lifting;Standing;House hold activities;Writing;Walking;Other (comment)    Diagnostic tests Brain MRI 11/01/2017: IMPRESSION: 1. Acute right ACA territory infarct as demonstrated on  prior CT imaging. No intracranial hemorrhage or significant mass effect. 2. 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    Patient Stated Goals wants to be able to walk again    Currently in Pain? No/denies             TREATMENT:   Patient transferred squat pivot wheelchair to mat table, mod A +2 with cues for hand placement and trunk position. Patient exhibits little to no  posterior left lateral lean exhibiting better trunk control;  Sitting edge of mat table: Able to sit unsupported with feet on floor with neutral weight shift with visual cues for correction:  Seated: BUE lift x5 reps each with stand by assist, with visual and verbal cues for erect  sitting posture;   He required mod A +2 for transition sit to supine;   He was dependent +2 for scooting up on mat table;   Patient hooklying:   Lumbar trunk rotation with small ball between knees for increased LLE hip adduction, x5 reps each direction requiring mod VCs for increased ROM keeping LLE knee close to RLEfor increased LLE muscle activation;  Hip flexion march x10 reps each LE with visual cues to increase ROM, AAROM on LLE;   SLR x12 reps RLE, x5 reps AAROM on LLE with decreased quad activation and decreased hip flexion noted;   SAQ x10 reps each LE with small bolster to challenge terminal knee extension; Patient able to initiate LLE quad activation but unable to achieve full ROM due to weakness and poor proprioception;   Bridges with arms by side 3 sec hold x15reps with cues for increased ROM and increased hold time with gluteal squeeze  Attempted posterior pelvic tilt but patient unable to follow instruction to perform correctly having difficulty initiating lower abdominal contraction;   Patient required min A +1 for transition supine to sidelying with cues for UE/LE positioning;  Patient required modA +68for transition sidelying to sitting with cues for hand placement and trunk control;     Upon sitting, instructed patient in neutral weight shift:feet supported with cues for forward weight shift.  Patient transferred squat pivot mat table to wheelchair with modA +2with cues for hand placement and positioning.    Pt response/clinical impression: Pt demonstrated good motivation throughout session. He does exhibit increased carryover from last week with less posterior lean at start of session. He does require increased time for most exercise with decreased muscle activation on LLE requiring AAROM. Patient reports mild fatigue at end of session;                          PT Education - 01/19/20 1258    Education Details LE  strengthening, transfers, trunk control, HEP    Person(s) Educated Patient;Child(ren)    Methods Explanation;Demonstration;Verbal cues    Comprehension Verbalized understanding;Returned demonstration;Verbal cues required;Need further instruction            PT Short Term Goals - 08/18/19 1305      PT SHORT TERM GOAL #1   Title Be independent with initial home exercise program for self-management of symptoms.    Baseline HEP to be given at second session; advice to practice unsupported sitting at home (06/19/2018); 07/08/18: Performing at home, 6/29: needs assistance but is doing exercise program regularly; 08/22/18: adherent;    Time 2    Period Weeks    Status Achieved    Target Date 06/26/18  PT Long Term Goals - 11/10/19 1323      PT LONG TERM GOAL #1   Title Patient will complete all bed mobility with min A to improve functional independence for getting in and out of bed and adjusting in bed.     Baseline max A - total A reported by family (06/19/2018); 07/08/18: still requires maxA, dtr reports improvement in rolling since starting therapy; 6/23: min A to supervision in clinic; not doing at home right; 8/5: Pt's daughter states that his rolling has improved, but still has difficulty with all other bed mobility; 10/23/18: pts daughter states that she is doing about 35% of the work if he is in proper position, he does uses the bed rails to help roll;  using Hoyer for coming to sit EOB; 10/12: pts daughter states that she is doing about 30% of the work if he is in proper position, he does uses the bed rails to help roll, but it is improving;  using Hoyer for coming to sit EOB, 10/29: min A for sit to sidelying, rolling supervision, able to transition sidelying to sit with HHA;    Time 12    Period Weeks    Status Achieved    Target Date 02/02/20      PT LONG TERM GOAL #2   Title Patient will complete sit <> stand transfer chair to chair with Min A and LRAD to improve functional  independence for household and community mobility.     Baseline supervision for STS, still requires elevated surface height to 27"    Time 12    Period Weeks    Status Achieved    Target Date 02/02/20      PT LONG TERM GOAL #3   Title Patient will navigate power w/c with min A x 100 feet to improve mobility for household and short community distances.    Baseline required total A (06/12/2018); able to roll 20 feet with min A (06/20/2018); 07/08/18: Pt can go approximately 10' without assist per family;  10/23/2018: pt's daughter states that his power WC mobility has become 100% better with the new hand control, slowed speed, and with practice; 10/12: pt's daughter reports that he can perform household WC mobility and limited community ambulation with minA    Time 12    Period Weeks    Status Achieved    Target Date 02/02/20      PT LONG TERM GOAL #4   Title Patient will complete car transfers W/C to family SUV with min A using LRAD to improve his abilty to participate in community activities.     Baseline unable to complete car transfers at all (06/12/2018); unable to complete car transfers at all (06/19/2018).; 07/08/18: Unable to perform at this time; 09/11/18: unable to perform at this time; 10/23/2018: unable to complete car transfers at all; 10/12:  unable to complete car transfers at all, 10/29: unable; 01/13/19: unable; 2/11: min-mod A +2, 3/10: min A+1    Time 12    Period Weeks    Status Achieved    Target Date 02/02/20      PT LONG TERM GOAL #5   Title Patient will ambulate at least 10 feet with min A using LRAD to improve mobility for household and community distances.    Baseline unable to take steps (06/12/2018); able to shift R leg forward and back with max A and RW at edge of plinth (06/19/2018); 07/08/18: unable to ambulate at this time. 09/11/18: unable to  perform at this time; 10/23/2018: unable to perform at this time; 10/12: unable to perform at this time, 10/29: unable at this time; 01/13/19:  unable at this time, 2/11: min A to step with RLE, unable to step with LLE, 3/10: able to take 2 steps in parallel bars with min A for safety; 4/1: min A for walking 10 feet in parallel bars, 5/24: min A walking 10 feet in parallel bars; 6/21: no change 7/14: no change    Time 12    Period Weeks    Status Partially Met    Target Date 02/02/20      PT LONG TERM GOAL #6   Title Patient will be able to safety navigate up/down ramp with power chair, exhibiting good safety awareness, mod I to safely enter/exit his home.    Baseline 2/11: supervision with intermittent min A;4/1: supervision; 5/24: supervision    Time 12    Period Weeks    Status Partially Met    Target Date 02/02/20      PT LONG TERM GOAL #7   Title Patient will increase functional reach test to >15 inches in sitting to exhibit improved sitting balance and positioning and reduce fall risk;    Baseline 07/30/18: 12 inches; 09/11/18: 10-12 inches; 10/23/18: 12-13 inch; 10/12: 14.5", 10/29: 15.5 inch    Time 4    Period Weeks    Status Achieved    Target Date 02/02/20      PT LONG TERM GOAL #8   Title Patient will increase LLE quad strength to 3+/5 to improve functional strength for standing and mobility;    Baseline 10/28: 2+/5; 01/13/19: 2+/5, 2/11: 3-/5, 04/16/19: 3-/5, 05/08/19: 3-/5, 5/24: 3-/5, 6/21: 3-/5, 7/14: 3-/5    Time 12    Period Weeks    Status Partially Met    Target Date 02/02/20      PT LONG TERM GOAL  #9   TITLE Patient will report no vertigo with provoking motions or positions in order to be able to perform ADLs and mobility tasks without symptoms.    Baseline 06/30/19: reports no dizziness in last week; 6/21: one episode this weekend; 7/14: experiencing one a week;    Time 12    Period Weeks    Status Partially Met    Target Date 02/02/20                 Plan - 01/19/20 1446    Clinical Impression Statement Patient motivated and participated well within session. He is progressing with better trunk  control overall exhibiting less posterior lean with cues for forward weight shift. Patient continues to require AAROM on LLE for increased ROM during strengthening exercise. He reports not being able to feel proprioception or muscle contraction on LLE. Patient does report increased fatigue at end of session. He would benefit from additional skilled PT Intervention to improve strength, balance and mobility; Recommend patient take a photo when rolling sidelying in bed for therapist to better assess better positioning;    Personal Factors and Comorbidities Age;Comorbidity 3+    Comorbidities Relevant past medical history and comorbidities include long term steroid use for lupus, CKD, chronic osteomyelitis, cervical spine stenosis, BPH, APS, alcohol abuse, peripheral artery disease, depression, GERD, obstructive sleep apnea, HTN, IBS, IgA deficiency, lumbar radiculopathy, RA, Stroke, toxic maculopathy in both eyes, systemic lupus erythematosus related syndrome, cardiac catheterization, carotid endarterectomy, cervical laminectomy, coronary angioplasty, Fusion C5-C7, knee arthroplasty, lumbar surgery.    Examination-Activity Limitations Bed Mobility;Bend;Sit;Toileting;Stand;Stairs;Lift;Transfers;Squat;Locomotion Level;Carry;Dressing;Hygiene/Grooming;Continence  Examination-Participation Restrictions Yard Work;Interpersonal Relationship;Community Activity    Stability/Clinical Decision Making Evolving/Moderate complexity    Rehab Potential Fair    PT Frequency 3x / week    PT Duration 12 weeks    PT Treatment/Interventions ADLs/Self Care Home Management;Electrical Stimulation;Moist Heat;Cryotherapy;Gait training;Stair training;Functional mobility training;Therapeutic exercise;Balance training;Neuromuscular re-education;Cognitive remediation;Patient/family education;Orthotic Fit/Training;Wheelchair mobility training;Manual techniques;Passive range of motion;Energy conservation;Joint Manipulations;Canalith  Repostioning;Therapeutic activities;Vestibular    PT Next Visit Plan Work on strength, sitting/standing balance, functional activities such as rolling, bed mobility, transfers; caregiver training    PT Home Exercise Plan no updates this session    Consulted and Agree with Plan of Care Patient;Family member/caregiver    Family Member Consulted DTR           Patient will benefit from skilled therapeutic intervention in order to improve the following deficits and impairments:  Abnormal gait,Decreased activity tolerance,Decreased cognition,Decreased endurance,Decreased knowledge of use of DME,Decreased range of motion,Decreased skin integrity,Decreased strength,Impaired perceived functional ability,Impaired sensation,Impaired UE functional use,Improper body mechanics,Pain,Cardiopulmonary status limiting activity,Decreased balance,Decreased coordination,Decreased mobility,Difficulty walking,Impaired tone,Postural dysfunction,Dizziness  Visit Diagnosis: Muscle weakness (generalized)  Other lack of coordination  Difficulty in walking, not elsewhere classified  Repeated falls     Problem List Patient Active Problem List   Diagnosis Date Noted  . Sepsis ([place]) 10/18/2018    Fynn Adel PT, DPT 01/19/2020, 2:48 PM  [place] MAIN [facility] SERVICES [address] [place], [place], [address] Phone: [phone]   Fax:  [phone]  Name: VITALIY EISENHOUR MRN: [id number] Date of Birth: 11/03/1945

## 2020-01-19 NOTE — Therapy (Signed)
[facility] [address] [place], [place], [address] Phone: [phone]   Fax:  [phone]  Occupational Therapy Treatment  Patient Details  Name: Kenneth Summers MRN: [id number] Date of Birth: 08/03/1945 No data recorded  Encounter Date: 01/19/2020   OT End of Session - 01/19/20 1352    Visit Number 159    Number of Visits 181    Date for OT Re-Evaluation 03/01/20    Authorization Type Progress report period starting 07/21/2019    OT Start Time 1348    OT Stop Time 1430    OT Time Calculation (min) 42 min    Activity Tolerance Patient tolerated treatment well    Behavior During Therapy WFL for tasks assessed/performed           Past Medical History:  Diagnosis Date  . Actinic keratosis   . Alcohol abuse   . APS (antiphospholipid syndrome) (HCC)   . Basal cell carcinoma   . BPH (benign prostatic hyperplasia)   . CAD (coronary artery disease)   . Cellulitis   . Cervical spinal stenosis   . Chronic osteomyelitis (HCC)   . CKD (chronic kidney disease)   . CKD (chronic kidney disease)   . Clostridium difficile diarrhea   . Depression   . Diplopia   . Fatigue   . GERD (gastroesophageal reflux disease)   . Hyperlipemia   . Hypertension   . Hypovitaminosis D   . IBS (irritable bowel syndrome)   . IgA deficiency (HCC)   . Insomnia   . Left lumbosacral radiculopathy   . Moderate obstructive sleep apnea   . Osteomyelitis of foot (HCC)   . PAD (peripheral artery disease) (HCC)   . Pruritus   . RA (rheumatoid arthritis) (HCC)   . Radiculopathy   . Restless legs syndrome   . RLS (restless legs syndrome)   . SLE (systemic lupus erythematosus related syndrome) (HCC)   . 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 Stroke (HCC)   . Toxic maculopathy of both eyes     Past Surgical History:  Procedure Laterality Date  . CARDIAC CATHETERIZATION    . CAROTID ENDARTERECTOMY     . CERVICAL LAMINECTOMY    . CORONARY ANGIOPLASTY    . FRACTURE SURGERY    . fusion C5-6-7    . HERNIA REPAIR    . KNEE ARTHROSCOPY    . TONSILLECTOMY      There were no vitals filed for this visit.   Self-care:  Pt. worked on UE dressing skills donning his winter jacket. Pt. required mod assist to donn his jacket, with verbal cues and assist to initiate threading his LUE. Pt. Required increased time, and step by step cues for  instruction throughout the task. Pt. Initially attempts to place the hooded sweatshirt backwards, required consisitent cues for the right direction. Pt. required maxA to manage his zipper. Pt. had difficulty engaging his LUE when manipulating the zipper.  Pt. performed 2.5# dowel ex. For UE strengthening secondary to weakness. Bilateral shoulder flexion, chest press, circular patterns, and elbow flexion/extension were performed. Pt. Required cues to keep the bar straight.  Pt. reports that his left hand is feeling better today, and was able to engage it more during zipping. Pt. Continues to require increased cues for each step of the task. Pt. continues to benefit from additional work on improving UE dressing skills. Pt. continues to work on improving UE   strength, FMC skills, and visual perceptual skills in order to improve,  And maximize independence with ADLs, and IADL tasks.                             OT Long Term Goals - 12/08/19 1437      OT LONG TERM GOAL #1   Title Pt. will increase left shoulder flexion AROM by 10 degrees to access cabinet/shelf.    Baseline Pt. continues to progress with left shoulder ROM .Shouler flexion 130, and continues to work on reaching with increasing emphasis on flexing trunk to improve functional reach.  07/21/19- shoulder flexion to 130 with effort    Time 12    Period Weeks    Status Not Met    Target Date 03/01/20      OT LONG TERM GOAL #2   Title Pt. will donn a shirt with Supervision.     Baseline Pt. continues to require MinA donning a zip down jacket with consistent cues to initiate the correct direction of the jacket.  MinA for donning a T-shirt with assist to roll side to side to pull the shirt down over his back. 07/21/19 continues to require min assist at times for shirt/jacket    Time 12    Period Weeks    Status On-going    Target Date 03/01/20      OT LONG TERM GOAL #3   Title Pt. will require ModA to perform LE dressing    Baseline MaxA left, ModA with the right. Pt. has sores on his ankle, and feet. Pt.'s daughter is assisting.    Time 12    Period Weeks    Status Deferred    Target Date 03/02/19      OT LONG TERM GOAL #4   Title Pt. will improve left grip strength by 10# to be able to open a jar/container.    Baseline Pt. continues to present with limited left hand grip strength. Pt. continues to work towards opening containers, and bottles.    Time 12    Period Weeks    Status On-going    Target Date 03/02/19      OT LONG TERM GOAL #5   Title Pt. will improve left hand FMC skills to be able to assist with buttoning/zipping.    Baseline Pt. has improved overall donning a jacket, and  manipulating the zipper on his jacket, however continues to require practice. Pt. continues to work on improving bilateral FMC skills for buttoning/zipping.  07/21/19 improving with buttons and zippers.    Time 12    Period Weeks    Status On-going    Target Date 03/01/20      OT LONG TERM GOAL #6   Title Pt. will demonstrate visual conmpensatory strategies for 100% of the time during ADLs    Baseline Pt.continues to require intermittent cues cues for left sides awareness.    Time 12    Period Weeks    Status Partially Met    Target Date 03/01/20      OT LONG TERM GOAL #7   Title Pt. will prepare a simple cold snack from w/c with supervision using cognitive compensatory strategies 100% of the time.    Baseline Pt. has difficulty with getting the w/c close enough to access  the refrigerator    Time 12    Period Weeks    Status Deferred      OT LONG   TERM GOAL #8   Title Pt. will accurately identify potential safety hazard using good safety awareness, and judgement 100% for ADLs, and IADLs.    Baseline Pt. continues to require cues    Time 12    Period Weeks    Status On-going    Target Date 03/01/20      OT LONG TERM GOAL  #9   TITLE Pt. will  navigate his w/c around obstacles with Supervision and 100% accuracy    Baseline Pt. continues to require cuing for obstacles on the left, however requires less cues overall. Pt. requires significant cues to engage his left UE during tasks. 07/21/2019 continues to improve with decreased assist at times    Time 12    Period Weeks    Status On-going    Target Date 03/01/20      OT LONG TERM GOAL  #10   TITLE Pt. will independently be able  to reach to place items into cabinetry, and closets.    Baseline Pt. BUE strength is improving. UE functional reaching conitnues to be limited.    Time 12    Period Weeks    Status On-going    Target Date 03/01/20      OT LONG TERM GOAL  #11   TITLE Pt. will increase BUE strength by 66m grades in preparation for ADL transfers.    Baseline Pt. in improveing BUE strength, and  is able to tolertae increased resistive weights. Pt. continues to work towards improving overall strength.    Time 12    Period Weeks    Status On-going    Target Date 03/01/20      OT LONG TERM GOAL  #12   TITLE Pt. will improve LUE functional reaching with the left engaging the trunk 100% of the time during ADL tasks with minimal cues.    Baseline Pt. conitnues to improve with LUE functional reaching. Fewer cues are required to engage his trunk for extended reaching.    Time 12    Period Weeks    Status On-going    Target Date 03/01/20                 Plan - 01/19/20 1353    Clinical Impression Statement Pt. reports that his left hand is feeling better today, and was able to engage it  more during zipping. Pt. Continues to require increased cues for each step of the task. Pt. continues to benefit from additional work on improving UE dressing skills. Pt. continues to work on improving UE strength, [facility], and visual perceptual skills in order to improve,  And maximize independence with ADLs, and IADL tasks.   Occupational performance deficits (Please refer to evaluation for details): ADL's    Body Structure / Function / Physical Skills UE functional use;Coordination;FMC;Dexterity;Strength;ROM    Cognitive Skills Attention;Memory;Emotional;Problem Solve;Safety Awareness    Rehab Potential Good    Clinical Decision Making Several treatment options, min-mod task modification necessary    Comorbidities Affecting Occupational Performance: Presence of comorbidities impacting occupational performance    Comorbidities impacting occupational performance description: Phyical, cognitive, visual,  medical comorbidities    Modification or Assistance to Complete Evaluation  No modification of tasks or assist necessary to complete eval    OT Frequency 2x / week    OT Duration 12 weeks    Consulted and Agree with Plan of Care Patient;Family member/caregiver           Patient will benefit from skilled therapeutic  intervention in order to improve the following deficits and impairments:   Body Structure / Function / Physical Skills: UE functional use,Coordination,FMC,Dexterity,Strength,ROM Cognitive Skills: Attention,Memory,Emotional,Problem Solve,Safety Awareness     Visit Diagnosis: Muscle weakness (generalized)    Problem List Patient Active Problem List   Diagnosis Date Noted  . Sepsis (HCC) 10/18/2018     , MS, OTR/L 01/19/2020, 2:17 PM  [place]  [facility] [address] [place], [place], [address] Phone: [phone]   Fax:  [phone]  Name: Kenneth Summers MRN: [id number] Date of Birth:  03/27/1945 

## 2020-01-21 ENCOUNTER — Ambulatory Visit: Payer: Medicare Other | Admitting: Physical Therapy

## 2020-01-21 ENCOUNTER — Other Ambulatory Visit: Payer: Self-pay

## 2020-01-21 ENCOUNTER — Encounter: Payer: Self-pay | Admitting: Physical Therapy

## 2020-01-21 DIAGNOSIS — R278 Other lack of coordination: Secondary | ICD-10-CM

## 2020-01-21 DIAGNOSIS — M6281 Muscle weakness (generalized): Secondary | ICD-10-CM

## 2020-01-21 DIAGNOSIS — R296 Repeated falls: Secondary | ICD-10-CM

## 2020-01-21 DIAGNOSIS — R262 Difficulty in walking, not elsewhere classified: Secondary | ICD-10-CM

## 2020-01-21 NOTE — Therapy (Signed)
[place] [facility] MAIN [facility] SERVICES [address] [place], [place], [address] Phone: [phone]   Fax:  [phone]  Physical Therapy Treatment  Patient Details  Name: Kenneth Summers MRN: [id number] Date of Birth: January 25, 1946 No data recorded  Encounter Date: 01/21/2020   PT End of Session - 01/21/20 1304    Visit Number [id number]    Number of Visits 252    Date for PT Re-Evaluation 02/02/20    Authorization Type Medicare reporting period starting 09/11/18; NO FOTO    Authorization Time Period goals last assessed 08/20/19    PT Start Time 1302    PT Stop Time 1345    PT Time Calculation (min) 43 min    Equipment Utilized During Treatment Gait belt    Activity Tolerance Patient tolerated treatment well;No increased pain    Behavior During Therapy WFL for tasks assessed/performed           Past Medical History:  Diagnosis Date  . Actinic keratosis   . Alcohol abuse   . APS (antiphospholipid syndrome) (HCC)   . Basal cell carcinoma   . BPH (benign prostatic hyperplasia)   . CAD (coronary artery disease)   . Cellulitis   . Cervical spinal stenosis   . Chronic osteomyelitis (HCC)   . CKD (chronic kidney disease)   . CKD (chronic kidney disease)   . Clostridium difficile diarrhea   . Depression   . Diplopia   . Fatigue   . GERD (gastroesophageal reflux disease)   . Hyperlipemia   . Hypertension   . Hypovitaminosis D   . IBS (irritable bowel syndrome)   . IgA deficiency (HCC)   . Insomnia   . Left lumbosacral radiculopathy   . Moderate obstructive sleep apnea   . Osteomyelitis of foot (HCC)   . PAD (peripheral artery disease) (HCC)   . Pruritus   . RA (rheumatoid arthritis) (HCC)   . Radiculopathy   . Restless legs syndrome   . RLS (restless legs syndrome)   . SLE (systemic lupus erythematosus related syndrome) (HCC)   . 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 Stroke (HCC)   . Toxic maculopathy  of both eyes     Past Surgical History:  Procedure Laterality Date  . CARDIAC CATHETERIZATION    . CAROTID ENDARTERECTOMY    . CERVICAL LAMINECTOMY    . CORONARY ANGIOPLASTY    . FRACTURE SURGERY    . fusion C5-6-7    . HERNIA REPAIR    . KNEE ARTHROSCOPY    . TONSILLECTOMY      There were no vitals filed for this visit.   Subjective Assessment - 01/21/20 1306    Subjective Patient reports doing well today; Denies any pain ; Denies any new dizzy spells; Reports doing well with exercises. Patient reports not sleeping well last night stating his legs were restless;    Pertinent History Patient is a 74 y.o. male who presents to outpatient physical therapy with a referral for medical diagnosis of CVA. This patient's chief complaints consist of left hemiplegia and overall deconditioning leading to the following functional deficits: dependent for ADLs, IADLs, unable to transfer to car, dependent transfers at home with hoyer lift, unable to walk or stand without significant assistance, difficulty with W/C navigation..    Limitations Sitting;Lifting;Standing;House hold activities;Writing;Walking;Other (comment)    Diagnostic tests Brain MRI 11/01/2017: IMPRESSION: 1. Acute right ACA territory infarct as demonstrated on prior CT  imaging. No intracranial hemorrhage or significant mass effect. 2. 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    Patient Stated Goals wants to be able to walk again    Currently in Pain? No/denies    Multiple Pain Sites No                TREATMENT:  When sitting in chair, patient exhibits heavy posterior/left lateral lean this session;  Educated patient in neutral weight shift while sitting in chair but patient unable to maintain or achieve; He reports he has been working on sitting in chair but has been getting help to hold himself in midline;   Patient transferred squat pivot wheelchair to mat table, max A +3 with cues for hand  placement and trunk position. Patient exhibits little to noposterior left lateral lean exhibiting better trunk control;   He required max A +2 for transition sit to supine;   He was dependent +2 for scooting up on mat table;   Patient hooklying:   Lumbar trunk rotation , x5 reps each direction requiring mod VCs for increased ROM keeping LLE knee close to RLEfor increased LLE muscle activation;  Hip flexion march with contralateral UE reach to challenge core abdominal contraction x10 reps each LE with visual cues to increase ROM, AAROM on LLE;   Bridges with arms by side 5 sec hold x10 reps with cues for increased ROM and increased hold time with gluteal squeeze; increased hold time for better strengthening and to improve patient's ability for pulling pants up.   Attempted SAQ but patient unable to activate LLE due to weakness  Patient required min A +1 for transition supine to sidelying with cues for UE/LE positioning;  Patient required modA +62for transition sidelying to sitting with cues for hand placement and trunk control;   Sitting edge of mat table: Able to sit unsupported with feet on floor with neutral weight shift with visual cues for correction, requiring min A for safety Instructed patient in reaching various directions with each UE to challenge trunk control x2 min; Instructed patient in reaching down with RUE to side of floor for pick up a kleenex  Working on forward/side reaching for better functional mobility x5 attempts. Patient was able to touch kleenex but had significant difficulty grabbing tissue;   Sitting, trunk rotation side/side with arms across chest x5 reps with min A from therapist for trunk control and mod Vcs to increase ROM for better stretch and mobility;   Patient transferred squat pivot mat table to wheelchair with maxA +3with cues for hand placement and positioning.    Pt response/clinical impression: Pt demonstrated good  motivation throughout session.He does exhibit increased fatigue this session with poor trunk control and decreased LLE muscle activation. Reinforced HEP                         PT Education - 01/21/20 1304    Education Details LE strengthening, transfers, trunk control, HEP    Person(s) Educated Patient    Methods Explanation;Verbal cues    Comprehension Verbalized understanding;Returned demonstration;Verbal cues required;Need further instruction            PT Short Term Goals - 08/18/19 1305      PT SHORT TERM GOAL #1   Title Be independent with initial home exercise program for self-management of symptoms.    Baseline HEP to be given at second session; advice to practice unsupported sitting at home (06/19/2018);  07/08/18: Performing at home, 6/29: needs assistance but is doing exercise program regularly; 08/22/18: adherent;    Time 2    Period Weeks    Status Achieved    Target Date 06/26/18             PT Long Term Goals - 11/10/19 1323      PT LONG TERM GOAL #1   Title Patient will complete all bed mobility with min A to improve functional independence for getting in and out of bed and adjusting in bed.     Baseline max A - total A reported by family (06/19/2018); 07/08/18: still requires maxA, dtr reports improvement in rolling since starting therapy; 6/23: min A to supervision in clinic; not doing at home right; 8/5: Pt's daughter states that his rolling has improved, but still has difficulty with all other bed mobility; 10/23/18: pts daughter states that she is doing about 35% of the work if he is in proper position, he does uses the bed rails to help roll;  using Hoyer for coming to sit EOB; 10/12: pts daughter states that she is doing about 30% of the work if he is in proper position, he does uses the bed rails to help roll, but it is improving;  using Hoyer for coming to sit EOB, 10/29: min A for sit to sidelying, rolling supervision, able to transition  sidelying to sit with HHA;    Time 12    Period Weeks    Status Achieved    Target Date 02/02/20      PT LONG TERM GOAL #2   Title Patient will complete sit <> stand transfer chair to chair with Min A and LRAD to improve functional independence for household and community mobility.     Baseline supervision for STS, still requires elevated surface height to 27"    Time 12    Period Weeks    Status Achieved    Target Date 02/02/20      PT LONG TERM GOAL #3   Title Patient will navigate power w/c with min A x 100 feet to improve mobility for household and short community distances.    Baseline required total A (06/12/2018); able to roll 20 feet with min A (06/20/2018); 07/08/18: Pt can go approximately 10' without assist per family;  10/23/2018: pt's daughter states that his power WC mobility has become 100% better with the new hand control, slowed speed, and with practice; 10/12: pt's daughter reports that he can perform household WC mobility and limited community ambulation with minA    Time 12    Period Weeks    Status Achieved    Target Date 02/02/20      PT LONG TERM GOAL #4   Title Patient will complete car transfers W/C to family SUV with min A using LRAD to improve his abilty to participate in community activities.     Baseline unable to complete car transfers at all (06/12/2018); unable to complete car transfers at all (06/19/2018).; 07/08/18: Unable to perform at this time; 09/11/18: unable to perform at this time; 10/23/2018: unable to complete car transfers at all; 10/12:  unable to complete car transfers at all, 10/29: unable; 01/13/19: unable; 2/11: min-mod A +2, 3/10: min A+1    Time 12    Period Weeks    Status Achieved    Target Date 02/02/20      PT LONG TERM GOAL #5   Title Patient will ambulate at least 10 feet with min  A using LRAD to improve mobility for household and community distances.    Baseline unable to take steps (06/12/2018); able to shift R leg forward and back with max A  and RW at edge of plinth (06/19/2018); 07/08/18: unable to ambulate at this time. 09/11/18: unable to perform at this time; 10/23/2018: unable to perform at this time; 10/12: unable to perform at this time, 10/29: unable at this time; 01/13/19: unable at this time, 2/11: min A to step with RLE, unable to step with LLE, 3/10: able to take 2 steps in parallel bars with min A for safety; 4/1: min A for walking 10 feet in parallel bars, 5/24: min A walking 10 feet in parallel bars; 6/21: no change 7/14: no change    Time 12    Period Weeks    Status Partially Met    Target Date 02/02/20      PT LONG TERM GOAL #6   Title Patient will be able to safety navigate up/down ramp with power chair, exhibiting good safety awareness, mod I to safely enter/exit his home.    Baseline 2/11: supervision with intermittent min A;4/1: supervision; 5/24: supervision    Time 12    Period Weeks    Status Partially Met    Target Date 02/02/20      PT LONG TERM GOAL #7   Title Patient will increase functional reach test to >15 inches in sitting to exhibit improved sitting balance and positioning and reduce fall risk;    Baseline 07/30/18: 12 inches; 09/11/18: 10-12 inches; 10/23/18: 12-13 inch; 10/12: 14.5", 10/29: 15.5 inch    Time 4    Period Weeks    Status Achieved    Target Date 02/02/20      PT LONG TERM GOAL #8   Title Patient will increase LLE quad strength to 3+/5 to improve functional strength for standing and mobility;    Baseline 10/28: 2+/5; 01/13/19: 2+/5, 2/11: 3-/5, 04/16/19: 3-/5, 05/08/19: 3-/5, 5/24: 3-/5, 6/21: 3-/5, 7/14: 3-/5    Time 12    Period Weeks    Status Partially Met    Target Date 02/02/20      PT LONG TERM GOAL  #9   TITLE Patient will report no vertigo with provoking motions or positions in order to be able to perform ADLs and mobility tasks without symptoms.    Baseline 06/30/19: reports no dizziness in last week; 6/21: one episode this weekend; 7/14: experiencing one a week;    Time 12     Period Weeks    Status Partially Met    Target Date 02/02/20                 Plan - 01/21/20 1315    Clinical Impression Statement Patient motivated and tolerated session fair. He does exhibit increased posterior/left lateral lean this session having difficulty correcting trunk position requiring min A for most sitting exercise. He also had difficulty activating LLE for LE strengthening. Progressed strengthening exercise, adding core abdominal strengthening. Patient does fatigue quickly with exercise requiring short rest breaks. He required increased assistance with most positioning including rolling and transfers. He would benefit from additional skilled PT Intervention to improve strength, balance and mobility;    Personal Factors and Comorbidities Age;Comorbidity 3+    Comorbidities Relevant past medical history and comorbidities include long term steroid use for lupus, CKD, chronic osteomyelitis, cervical spine stenosis, BPH, APS, alcohol abuse, peripheral artery disease, depression, GERD, obstructive sleep apnea, HTN, IBS, IgA deficiency, lumbar  radiculopathy, RA, Stroke, toxic maculopathy in both eyes, systemic lupus erythematosus related syndrome, cardiac catheterization, carotid endarterectomy, cervical laminectomy, coronary angioplasty, Fusion C5-C7, knee arthroplasty, lumbar surgery.    Examination-Activity Limitations Bed Mobility;Bend;Sit;Toileting;Stand;Stairs;Lift;Transfers;Squat;Locomotion Level;Carry;Dressing;Hygiene/Grooming;Continence    Examination-Participation Restrictions Yard Work;Interpersonal Relationship;Community Activity    Stability/Clinical Decision Making Evolving/Moderate complexity    Rehab Potential Fair    PT Frequency 3x / week    PT Duration 12 weeks    PT Treatment/Interventions ADLs/Self Care Home Management;Electrical Stimulation;Moist Heat;Cryotherapy;Gait training;Stair training;Functional mobility training;Therapeutic exercise;Balance  training;Neuromuscular re-education;Cognitive remediation;Patient/family education;Orthotic Fit/Training;Wheelchair mobility training;Manual techniques;Passive range of motion;Energy conservation;Joint Manipulations;Canalith Repostioning;Therapeutic activities;Vestibular    PT Next Visit Plan Work on strength, sitting/standing balance, functional activities such as rolling, bed mobility, transfers; caregiver training    PT Home Exercise Plan no updates this session    Consulted and Agree with Plan of Care Patient;Family member/caregiver    Family Member Consulted DTR           Patient will benefit from skilled therapeutic intervention in order to improve the following deficits and impairments:  Abnormal gait,Decreased activity tolerance,Decreased cognition,Decreased endurance,Decreased knowledge of use of DME,Decreased range of motion,Decreased skin integrity,Decreased strength,Impaired perceived functional ability,Impaired sensation,Impaired UE functional use,Improper body mechanics,Pain,Cardiopulmonary status limiting activity,Decreased balance,Decreased coordination,Decreased mobility,Difficulty walking,Impaired tone,Postural dysfunction,Dizziness  Visit Diagnosis: Muscle weakness (generalized)  Other lack of coordination  Difficulty in walking, not elsewhere classified  Repeated falls     Problem List Patient Active Problem List   Diagnosis Date Noted  . Sepsis ([place]) 10/18/2018    Burnice Vassel,MargaretPT, DPT 01/22/2020, 9:58 AM  [place] MAIN [facility] SERVICES [address] [place], [place], [address] Phone: [phone]   Fax:  [phone]  Name: ANDREAS SOBOLEWSKI MRN: [id number] Date of Birth: 1945/03/30

## 2020-01-22 ENCOUNTER — Encounter: Payer: Self-pay | Admitting: Occupational Therapy

## 2020-01-22 ENCOUNTER — Other Ambulatory Visit: Payer: Self-pay

## 2020-01-22 ENCOUNTER — Ambulatory Visit: Payer: Medicare Other | Admitting: Occupational Therapy

## 2020-01-22 ENCOUNTER — Ambulatory Visit: Payer: Medicare Other | Admitting: Physical Therapy

## 2020-01-22 ENCOUNTER — Encounter: Payer: Self-pay | Admitting: Physical Therapy

## 2020-01-22 DIAGNOSIS — M6281 Muscle weakness (generalized): Secondary | ICD-10-CM | POA: Diagnosis not present

## 2020-01-22 DIAGNOSIS — R262 Difficulty in walking, not elsewhere classified: Secondary | ICD-10-CM

## 2020-01-22 DIAGNOSIS — R278 Other lack of coordination: Secondary | ICD-10-CM

## 2020-01-22 DIAGNOSIS — R296 Repeated falls: Secondary | ICD-10-CM

## 2020-01-22 NOTE — Therapy (Signed)
[place] MAIN [facility] SERVICES [address] [place], [place], [address] Phone: [phone]   Fax:  [phone]  Physical Therapy Treatment  Patient Details  Name: Kenneth Summers MRN: [id number] Date of Birth: [birth year] No data recorded  Encounter Date: 01/22/2020   PT End of Session - 01/22/20 1354    Visit Number [id number]    Number of Visits 252    Date for PT Re-Evaluation 02/02/20    Authorization Type Medicare reporting period starting 09/11/18; NO FOTO    Authorization Time Period goals last assessed 08/20/19    PT Start Time 1346    PT Stop Time 1430    PT Time Calculation (min) 44 min    Equipment Utilized During Treatment Gait belt    Activity Tolerance Patient tolerated treatment well;No increased pain    Behavior During Therapy WFL for tasks assessed/performed           Past Medical History:  Diagnosis Date  . Actinic keratosis   . Alcohol abuse   . APS (antiphospholipid syndrome) ([place])   . Basal cell carcinoma   . BPH (benign prostatic hyperplasia)   . CAD (coronary artery disease)   . Cellulitis   . Cervical spinal stenosis   . Chronic osteomyelitis ([place])   . CKD (chronic kidney disease)   . CKD (chronic kidney disease)   . Clostridium difficile diarrhea   . Depression   . Diplopia   . Fatigue   . GERD (gastroesophageal reflux disease)   . Hyperlipemia   . Hypertension   . Hypovitaminosis D   . IBS (irritable bowel syndrome)   . 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 Insomnia   . Left lumbosacral radiculopathy   . Moderate obstructive sleep apnea   . Osteomyelitis of foot ([place])   . PAD (peripheral artery disease) ([place])   . Pruritus   . RA (rheumatoid arthritis) ([place])   . Radiculopathy   . Restless legs syndrome   . RLS (restless legs syndrome)   . SLE (systemic lupus erythematosus related syndrome) ([place])   . 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 Stroke ([place])   . Toxic maculopathy  of both eyes     Past Surgical History:  Procedure Laterality Date  . CARDIAC CATHETERIZATION    . CAROTID ENDARTERECTOMY    . CERVICAL LAMINECTOMY    . CORONARY ANGIOPLASTY    . FRACTURE SURGERY    . fusion C5-6-7    . HERNIA REPAIR    . KNEE ARTHROSCOPY    . TONSILLECTOMY      There were no vitals filed for this visit.   Subjective Assessment - 01/22/20 1351    Subjective Patient reports doing well; Reports sleeping better last night; He does present to therapy with left lateral lean;    Pertinent History Patient is a 74 y.o. male who presents to outpatient physical therapy with a referral for medical diagnosis of CVA. This patient's chief complaints consist of left hemiplegia and overall deconditioning leading to the following functional deficits: dependent for ADLs, IADLs, unable to transfer to car, dependent transfers at home with hoyer lift, unable to walk or stand without significant assistance, difficulty with W/C navigation..    Limitations Sitting;Lifting;Standing;House hold activities;Writing;Walking;Other (comment)    Diagnostic tests Brain MRI 11/01/2017: IMPRESSION: 1. Acute right ACA territory infarct as demonstrated on prior CT imaging. No intracranial hemorrhage or significant mass effect. 2. 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    Patient Stated Goals wants to be able to walk again    Currently in Pain? No/denies    Multiple Pain Sites No               TREATMENT:  When sitting in chair, patient exhibits heavy posterior/left lateral lean this session;  Educated patient in neutral weight shift while sitting in chair but patient unable to maintain or achieve; He reports he has been working on sitting in chair but has been getting help to hold himself in midline;   Patient transferred to mat table from wheelchair with hoyer lift. Utilized patient's personal lift pad for ease of transfer;   Patient hooklying:   Lumbar  trunk rotation,x5 reps each direction requiring mod VCs for increased ROM keeping LLE knee close to RLEfor increased LLE muscle activation;  Hip flexion march with contralateral UE reach to challenge core abdominal contraction x10 reps each LE with visual cues to increase ROM, AAROM on LLE;   BUE reach towards knees for curl up (decreased ROM on LUE) x10 reps;   Bridges with arms by side 5 sec hold x10 reps with cues for increased ROM and increased hold time with gluteal squeeze; increased hold time for better strengthening and to improve patient's ability for pulling pants up.     Patient required min A +1 for transition supine to sidelying with cues for UE/LE positioning;He was able to initiate LLE movement/hip adduction better this session but still required min A for reaching with LUE;   Patient required modA +84for transition sidelying to sitting with cues for hand placement and trunk control;   Sitting edge of mat table: Able to sit unsupported with feet on floor with neutral weight shift with visual cues for correction, requiring min A-stand by assist for safety Instructed patient in reaching various directions with each UE to challenge trunk control x2 min;  PT educated patient/caregiver in how to place hoyer pad under patient in sitting with instruction for patient to laterally lean while therapist pulled pad under contralateral hip x2 reps each direction for proper pad placement; He did require mod A +2 for trunk control with max verbal cues for forward weight shift with lateral lean for better positioning;   Patient transferred from mat table to wheelchair with hoyer lift with good positioning in chair;    Pt response/clinical impression: Pt demonstrated goodmotivation throughout session.He was able to exhibit better LLE muscle activation this session. Focused on core/trunk strengthening for better trunk control. Patient continues to require min A with rolling  and bed mobility for proper positioning. He would benefit from additional education/instruction in optimal trunk control to increase ADL ability at home.                         PT Education - 01/22/20 1353    Education Details LE strengthening, transfers, trunk control    Person(s) Educated Patient    Methods Explanation;Verbal cues    Comprehension Verbalized understanding;Returned demonstration;Verbal cues required;Need further instruction            PT Short Term Goals - 08/18/19 1305      PT SHORT TERM GOAL #1   Title Be independent with initial home exercise program for self-management of symptoms.    Baseline HEP to be given at second session; advice to practice unsupported sitting at home (06/19/2018); 07/08/18: Performing at home, 6/29: needs assistance but is  doing exercise program regularly; 08/22/18: adherent;    Time 2    Period Weeks    Status Achieved    Target Date 06/26/18             PT Long Term Goals - 11/10/19 1323      PT LONG TERM GOAL #1   Title Patient will complete all bed mobility with min A to improve functional independence for getting in and out of bed and adjusting in bed.     Baseline max A - total A reported by family (06/19/2018); 07/08/18: still requires maxA, dtr reports improvement in rolling since starting therapy; 6/23: min A to supervision in clinic; not doing at home right; 8/5: Pt's daughter states that his rolling has improved, but still has difficulty with all other bed mobility; 10/23/18: pts daughter states that she is doing about 35% of the work if he is in proper position, he does uses the bed rails to help roll;  using Hoyer for coming to sit EOB; 10/12: pts daughter states that she is doing about 30% of the work if he is in proper position, he does uses the bed rails to help roll, but it is improving;  using Hoyer for coming to sit EOB, 10/29: min A for sit to sidelying, rolling supervision, able to transition sidelying to  sit with HHA;    Time 12    Period Weeks    Status Achieved    Target Date 02/02/20      PT LONG TERM GOAL #2   Title Patient will complete sit <> stand transfer chair to chair with Min A and LRAD to improve functional independence for household and community mobility.     Baseline supervision for STS, still requires elevated surface height to 27"    Time 12    Period Weeks    Status Achieved    Target Date 02/02/20      PT LONG TERM GOAL #3   Title Patient will navigate power w/c with min A x 100 feet to improve mobility for household and short community distances.    Baseline required total A (06/12/2018); able to roll 20 feet with min A (06/20/2018); 07/08/18: Pt can go approximately 10' without assist per family;  10/23/2018: pt's daughter states that his power WC mobility has become 100% better with the new hand control, slowed speed, and with practice; 10/12: pt's daughter reports that he can perform household WC mobility and limited community ambulation with minA    Time 12    Period Weeks    Status Achieved    Target Date 02/02/20      PT LONG TERM GOAL #4   Title Patient will complete car transfers W/C to family SUV with min A using LRAD to improve his abilty to participate in community activities.     Baseline unable to complete car transfers at all (06/12/2018); unable to complete car transfers at all (06/19/2018).; 07/08/18: Unable to perform at this time; 09/11/18: unable to perform at this time; 10/23/2018: unable to complete car transfers at all; 10/12:  unable to complete car transfers at all, 10/29: unable; 01/13/19: unable; 2/11: min-mod A +2, 3/10: min A+1    Time 12    Period Weeks    Status Achieved    Target Date 02/02/20      PT LONG TERM GOAL #5   Title Patient will ambulate at least 10 feet with min A using LRAD to improve mobility for household and  community distances.    Baseline unable to take steps (06/12/2018); able to shift R leg forward and back with max A and RW at  edge of plinth (06/19/2018); 07/08/18: unable to ambulate at this time. 09/11/18: unable to perform at this time; 10/23/2018: unable to perform at this time; 10/12: unable to perform at this time, 10/29: unable at this time; 01/13/19: unable at this time, 2/11: min A to step with RLE, unable to step with LLE, 3/10: able to take 2 steps in parallel bars with min A for safety; 4/1: min A for walking 10 feet in parallel bars, 5/24: min A walking 10 feet in parallel bars; 6/21: no change 7/14: no change    Time 12    Period Weeks    Status Partially Met    Target Date 02/02/20      PT LONG TERM GOAL #6   Title Patient will be able to safety navigate up/down ramp with power chair, exhibiting good safety awareness, mod I to safely enter/exit his home.    Baseline 2/11: supervision with intermittent min A;4/1: supervision; 5/24: supervision    Time 12    Period Weeks    Status Partially Met    Target Date 02/02/20      PT LONG TERM GOAL #7   Title Patient will increase functional reach test to >15 inches in sitting to exhibit improved sitting balance and positioning and reduce fall risk;    Baseline 07/30/18: 12 inches; 09/11/18: 10-12 inches; 10/23/18: 12-13 inch; 10/12: 14.5", 10/29: 15.5 inch    Time 4    Period Weeks    Status Achieved    Target Date 02/02/20      PT LONG TERM GOAL #8   Title Patient will increase LLE quad strength to 3+/5 to improve functional strength for standing and mobility;    Baseline 10/28: 2+/5; 01/13/19: 2+/5, 2/11: 3-/5, 04/16/19: 3-/5, 05/08/19: 3-/5, 5/24: 3-/5, 6/21: 3-/5, 7/14: 3-/5    Time 12    Period Weeks    Status Partially Met    Target Date 02/02/20      PT LONG TERM GOAL  #9   TITLE Patient will report no vertigo with provoking motions or positions in order to be able to perform ADLs and mobility tasks without symptoms.    Baseline 06/30/19: reports no dizziness in last week; 6/21: one episode this weekend; 7/14: experiencing one a week;    Time 12    Period  Weeks    Status Partially Met    Target Date 02/02/20                 Plan - 01/22/20 1431    Clinical Impression Statement Patient tolerated lift well with transfers. He and caregiver do require education for proper positioning especially when transferring with hoyer lift from sitting position on mat table to wheelchair. Patient educated in lateral weight shift for hoyer pad placement requiring mod A +2 for balance and trunk control. He was able to exhibit better LLE positioning this session. Instructed patient in advanced core strengthening and hip strengthening exercise; patient does require min-mod VCs for proper positioning and min A for LLE positioning for optimal muscle activation. He reports mild to moderate fatigue at end of session. He would benefit from additional skilled PT intervention to improve strength, balance and mobility with transfers and bed mobility;    Personal Factors and Comorbidities Age;Comorbidity 3+    Comorbidities Relevant past medical history and comorbidities include long  term steroid use for lupus, CKD, chronic osteomyelitis, cervical spine stenosis, BPH, APS, alcohol abuse, peripheral artery disease, depression, GERD, obstructive sleep apnea, HTN, IBS, IgA deficiency, lumbar radiculopathy, RA, Stroke, toxic maculopathy in both eyes, systemic lupus erythematosus related syndrome, cardiac catheterization, carotid endarterectomy, cervical laminectomy, coronary angioplasty, Fusion C5-C7, knee arthroplasty, lumbar surgery.    Examination-Activity Limitations Bed Mobility;Bend;Sit;Toileting;Stand;Stairs;Lift;Transfers;Squat;Locomotion Level;Carry;Dressing;Hygiene/Grooming;Continence    Examination-Participation Restrictions Yard Work;Interpersonal Relationship;Community Activity    Stability/Clinical Decision Making Evolving/Moderate complexity    Rehab Potential Fair    PT Frequency 3x / week    PT Duration 12 weeks    PT Treatment/Interventions ADLs/Self Care  Home Management;Electrical Stimulation;Moist Heat;Cryotherapy;Gait training;Stair training;Functional mobility training;Therapeutic exercise;Balance training;Neuromuscular re-education;Cognitive remediation;Patient/family education;Orthotic Fit/Training;Wheelchair mobility training;Manual techniques;Passive range of motion;Energy conservation;Joint Manipulations;Canalith Repostioning;Therapeutic activities;Vestibular    PT Next Visit Plan Work on strength, sitting/standing balance, functional activities such as rolling, bed mobility, transfers; caregiver training    PT Home Exercise Plan no updates this session    Consulted and Agree with Plan of Care Patient;Family member/caregiver    Family Member Consulted DTR           Patient will benefit from skilled therapeutic intervention in order to improve the following deficits and impairments:  Abnormal gait,Decreased activity tolerance,Decreased cognition,Decreased endurance,Decreased knowledge of use of DME,Decreased range of motion,Decreased skin integrity,Decreased strength,Impaired perceived functional ability,Impaired sensation,Impaired UE functional use,Improper body mechanics,Pain,Cardiopulmonary status limiting activity,Decreased balance,Decreased coordination,Decreased mobility,Difficulty walking,Impaired tone,Postural dysfunction,Dizziness  Visit Diagnosis: Muscle weakness (generalized)  Other lack of coordination  Difficulty in walking, not elsewhere classified  Repeated falls     Problem List Patient Active Problem List   Diagnosis Date Noted  . Sepsis ([place]) 10/18/2018    Ameliah Baskins PT, DPT 01/22/2020, 2:33 PM  [place] MAIN [facility] SERVICES [address] [place], [place], [address] Phone: [phone]   Fax:  [phone]  Name: DECODA VAN MRN: [id number] Date of Birth: [birth year]

## 2020-01-22 NOTE — Therapy (Signed)
[place] MA[facility] SERVICES [address] [place], [place], [address] Phone: [phone]   Fax:  [phone]  Occupational Therapy Progress Note  Dates of reporting period  12/15/2019 to   01/21/2021   Patient Details  Name: Kenneth Summers MRN: [id number] Date of Birth: 1945-07-24 No data recorded  Encounter Date: 01/22/2020   OT End of Session - 01/22/20 1309    Visit Number 160    Number of Visits 181    Date for OT Re-Evaluation 03/01/20    Authorization Type Progress report period starting 12/15/2019    OT Start Time 1304    OT Stop Time 1345    OT Time Calculation (min) 41 min    Activity Tolerance Patient tolerated treatment well    Behavior During Therapy [facility] for tasks assessed/performed           Past Medical History:  Diagnosis Date  . Actinic keratosis   . Alcohol abuse   . APS (antiphospholipid syndrome) ([place])   . Basal cell carcinoma   . BPH (benign prostatic hyperplasia)   . CAD (coronary artery disease)   . Cellulitis   . Cervical spinal stenosis   . Chronic osteomyelitis ([place])   . CKD (chronic kidney disease)   . CKD (chronic kidney disease)   . Clostridium difficile diarrhea   . Depression   . Diplopia   . Fatigue   . GERD (gastroesophageal reflux disease)   . Hyperlipemia   . Hypertension   . Hypovitaminosis D   . IBS (irritable bowel syndrome)   . 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 Insomnia   . Left lumbosacral radiculopathy   . Moderate obstructive sleep apnea   . Osteomyelitis of foot ([place])   . PAD (peripheral artery disease) ([place])   . Pruritus   . RA (rheumatoid arthritis) ([place])   . Radiculopathy   . Restless legs syndrome   . RLS (restless legs syndrome)   . SLE (systemic lupus erythematosus related syndrome) ([place])   . 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 Stroke ([place])   . Toxic maculopathy of both eyes     Past Surgical History:  Procedure Laterality Date   . CARDIAC CATHETERIZATION    . CAROTID ENDARTERECTOMY    . CERVICAL LAMINECTOMY    . CORONARY ANGIOPLASTY    . FRACTURE SURGERY    . fusion C5-6-7    . HERNIA REPAIR    . KNEE ARTHROSCOPY    . TONSILLECTOMY      There were no vitals filed for this visit.   Subjective Assessment - 01/22/20 1309    Subjective  Pt. reports that his left hand is sore today    Patient is accompanied by: Family member    Pertinent History Pt. is a 74 y.o. 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 Pt. PMHx includes: Multiple Falls, Lupus, DJD, Renal Abscess, CVA. Pt. resides with his wife. Pt.'s wife and daughter assist with ADLs. Pt. has caregivers in  for 2 hours a day, 6 days a week. Pt. received Rehab services in acute care, at SNF for STR, and [facility]. Pt. is retired from [employer] for [employer] and [employer].    Currently in Pain? No/denies              [facility] OT Assessment - 01/22/20 0001      Coordination   Right  9 Hole Peg Test 38 sec.    Left 9 Hole Peg Test 1 min & 25 sec.      Strength   Overall Strength Comments LUE strength:      Hand Function   Right Hand Grip (lbs) 41    Right Hand Lateral Pinch 13 lbs    Right Hand 3 Point Pinch 9 lbs    Left Hand Grip (lbs) 27    Left Hand Lateral Pinch 8 lbs    Left 3 point pinch 8 lbs          Measurements were obtained, and goals were reviewed with the pt. Pt. Reports having a good morning today, and feeling good overall today.  Pt. has progressed with both the RUE, and LUE grip strength, pinch strength, and [place] skills.  Pt. continues to need to work on these skills in order to work towards  Improving donning various styles of jackets, managing zippers, reaching into cabinetry, opening jars, and containers.                    OT Education - 01/22/20 1309    Education Details BUE ther. ex    Person(s) Educated Patient    Methods Explanation;Demonstration    Comprehension  Returned demonstration;Tactile cues required;Verbalized understanding;Need further instruction               OT Long Term Goals - 01/22/20 1329      OT LONG TERM GOAL #1   Title Pt. will increase left shoulder flexion AROM by 10 degrees to access cabinet/shelf.    Baseline Pt. continues to progress with left shoulder ROM .Shouler flexion 130, and continues to work on reaching with increasing emphasis on flexing trunk to improve functional reach.  07/21/19- shoulder flexion to 130 with effort    Time 12    Period Weeks    Status Not Met    Target Date 03/01/20      OT LONG TERM GOAL #2   Title Pt. will donn a shirt with Supervision.    Baseline Pt. continues to require modA donning a zip down jacket with consistent cues to initiate the correct direction of the jacket.  Min-modA mahaging the zipper. MinA for donning a T-shirt with assist to roll side to side to pull the shirt down over his back. 07/21/19 continues to require min assist at times for shirt/jacket    Time 12    Period Weeks    Status On-going    Target Date 03/01/20      OT LONG TERM GOAL #4   Title Pt. will improve left grip strength by 10# to be able to open a jar/container.    Baseline Pt. is making stedy progress with grip strength. Pt. is having a herder time opening a wide mouthed jar, however is able to open a smaller jar easier.    Time 12    Period Weeks    Status On-going    Target Date 03/02/19      OT LONG TERM GOAL #5   Title Pt. will improve left hand [facility] skills to be able to assist with buttoning/zipping.    Baseline Pt. has improved overall donning a jacket, and  manipulating the zipper on his jacket, however continues to require practice depending on the type of zipper jacket. Pt. continues to work on improving bilateral [facility] skills for buttoning/zipping.  07/21/19 improving with buttons and zippers.    Time 12    Period  Weeks    Status On-going    Target Date 03/01/20      OT LONG TERM GOAL #6    Title Pt. will demonstrate visual conmpensatory strategies for 100% of the time during ADLs    Baseline Pt.continues to require intermittent cues cues for left sides awareness.    Time 12    Period Weeks    Status Partially Met    Target Date 03/01/20      OT LONG TERM GOAL #8   Title Pt. will accurately identify potential safety hazard using good safety awareness, and judgement 100% for ADLs, and IADLs.    Baseline Pt. continues to require cues    Time 12    Period Weeks    Status On-going    Target Date 03/01/20      OT LONG TERM GOAL  #9   TITLE Pt. will  navigate his w/c around obstacles with Supervision and 100% accuracy    Baseline Pt. continues to require cuing for obstacles on the left, however requires less cues overall. Pt. requires significant cues to engage his left UE during tasks. 07/21/2019 continues to improve with decreased assist at times    Time 12    Period Weeks    Status On-going    Target Date 03/01/20      OT LONG TERM GOAL  #10   TITLE Pt. will independently be able  to reach to place items into cabinetry, and closets.    Baseline Pt. continues to have difficulty with functional reaching conitnues to be limited.    Time 12    Period Weeks    Status On-going    Target Date 03/01/20      OT LONG TERM GOAL  #11   TITLE Pt. will increase BUE strength by 70m grades in preparation for ADL transfers.    Baseline Pt. BUE strength conitnues to be limited. Pt. is able to tolerate increased resistive weights. Pt. continues to work towards improving overall strength for ADLs.    Time 12    Period Weeks    Status On-going    Target Date 03/01/20      OT LONG TERM GOAL  #12   TITLE Pt. will improve LUE functional reaching with the left engaging the trunk 100% of the time during ADL tasks with minimal cues.    Baseline Pt. continues to improve with LUE functional reaching. Fewer cues are required to engage his trunk for extended reaching.    Time 12    Period Weeks     Status On-going    Target Date 03/01/20                 Plan - 01/22/20 1310    Clinical Impression Statement Measurements were obtained, and goals were reviewed with the pt. Pt. Reports having a good morning today, and feeling good overall today.  Pt. has progressed with both the RUE, and LUE grip strength, pinch strength, and [place].  Pt. continues to need to work on these skills in order to work towards  Improving donning various styles of jackets, managing zippers, reaching into cabinetry, opening jars, and containers.    OT Occupational Profile and History Problem Focused Assessment - Including review of records relating to presenting problem    Occupational performance deficits (Please refer to evaluation for details): ADL's    Body Structure / Function / Physical Skills UE functional use;Coordination;FMC;Dexterity;Strength;ROM    Cognitive Skills Attention;Memory;Emotional;Problem Solve;Safety Awareness  Rehab Potential Good    Clinical Decision Making Several treatment options, min-mod task modification necessary    Comorbidities Affecting Occupational Performance: Presence of comorbidities impacting occupational performance    Modification or Assistance to Complete Evaluation  No modification of tasks or assist necessary to complete eval    OT Frequency 2x / week    OT Duration 12 weeks    OT Treatment/Interventions Self-care/ADL training;DME and/or AE instruction;Therapeutic exercise;Therapeutic activities;Moist Heat;Cognitive remediation/compensation;Neuromuscular education;Visual/perceptual remediation/compensation;Coping strategies training;Patient/family education;Passive range of motion;Psychosocial skills training;Energy conservation;Functional Mobility Training    Consulted and Agree with Plan of Care Patient;Family member/caregiver           Patient will benefit from skilled therapeutic intervention in order to improve the following deficits and impairments:    Body Structure / Function / Physical Skills: UE functional use,Coordination,FMC,Dexterity,Strength,ROM Cognitive Skills: Attention,Memory,Emotional,Problem Solve,Safety Awareness     Visit Diagnosis: Muscle weakness (generalized)    Problem List Patient Active Problem List   Diagnosis Date Noted  . Sepsis ([place]) 10/18/2018    Harrel Carina, MS, OTR/L 01/22/2020, 1:50 PM  [place] MA[facility] SERVICES [address] [place], [place], [address] Phone: [phone]   Fax:  [phone]  Name: Kenneth Summers MRN: [id number] Date of Birth: [birth year]

## 2020-01-26 ENCOUNTER — Encounter: Payer: Self-pay | Admitting: Physical Therapy

## 2020-01-26 ENCOUNTER — Ambulatory Visit: Payer: Medicare Other | Admitting: Physical Therapy

## 2020-01-26 ENCOUNTER — Encounter: Payer: Self-pay | Admitting: Occupational Therapy

## 2020-01-26 ENCOUNTER — Ambulatory Visit: Payer: Medicare Other | Admitting: Occupational Therapy

## 2020-01-26 ENCOUNTER — Other Ambulatory Visit: Payer: Self-pay

## 2020-01-26 DIAGNOSIS — M6281 Muscle weakness (generalized): Secondary | ICD-10-CM | POA: Diagnosis not present

## 2020-01-26 DIAGNOSIS — R278 Other lack of coordination: Secondary | ICD-10-CM

## 2020-01-26 DIAGNOSIS — R296 Repeated falls: Secondary | ICD-10-CM

## 2020-01-26 DIAGNOSIS — R262 Difficulty in walking, not elsewhere classified: Secondary | ICD-10-CM

## 2020-01-26 NOTE — Therapy (Signed)
[place] MAIN [facility] SERVICES [address] [place], [place], [address] Phone: [phone]   Fax:  [phone]  Physical Therapy Treatment Physical Therapy Progress Note   Dates of reporting period  12/29/19   to   01/26/20   Patient Details  Name: Kenneth Summers MRN: [id number] Date of Birth: 25-Aug-1945 No data recorded  Encounter Date: 01/26/2020   PT End of Session - 01/26/20 1258    Visit Number [id number]    Number of Visits 252    Date for PT Re-Evaluation 04/19/20    Authorization Type Medicare reporting period starting 09/11/18; NO FOTO    Authorization Time Period goals last assessed 08/20/19    PT Start Time 1302    PT Stop Time 1345    PT Time Calculation (min) 43 min    Equipment Utilized During Treatment Gait belt    Activity Tolerance Patient tolerated treatment well;No increased pain    Behavior During Therapy WFL for tasks assessed/performed           Past Medical History:  Diagnosis Date  . Actinic keratosis   . Alcohol abuse   . APS (antiphospholipid syndrome) ([place])   . Basal cell carcinoma   . BPH (benign prostatic hyperplasia)   . CAD (coronary artery disease)   . Cellulitis   . Cervical spinal stenosis   . Chronic osteomyelitis ([place])   . CKD (chronic kidney disease)   . CKD (chronic kidney disease)   . Clostridium difficile diarrhea   . Depression   . Diplopia   . Fatigue   . GERD (gastroesophageal reflux disease)   . Hyperlipemia   . Hypertension   . Hypovitaminosis D   . IBS (irritable bowel syndrome)   . 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 Insomnia   . Left lumbosacral radiculopathy   . Moderate obstructive sleep apnea   . Osteomyelitis of foot ([place])   . PAD (peripheral artery disease) ([place])   . Pruritus   . RA (rheumatoid arthritis) ([place])   . Radiculopathy   . Restless legs syndrome   . RLS (restless legs syndrome)   . SLE (systemic lupus erythematosus related syndrome) ([place])   . Squamous cell  carcinoma of skin 01/27/2019   right mid lower ear helix  . Stroke ([place])   . Toxic maculopathy of both eyes     Past Surgical History:  Procedure Laterality Date  . CARDIAC CATHETERIZATION    . CAROTID ENDARTERECTOMY    . CERVICAL LAMINECTOMY    . CORONARY ANGIOPLASTY    . FRACTURE SURGERY    . fusion C5-6-7    . HERNIA REPAIR    . KNEE ARTHROSCOPY    . TONSILLECTOMY      There were no vitals filed for this visit.   Subjective Assessment - 01/26/20 1306    Subjective Patient reports doing well; Denies any pain; He reports still having restless leg syndrome which makes resting difficult;    Pertinent History Patient is a 74 y.o. male who presents to outpatient physical therapy with a referral for medical diagnosis of CVA. This patient's chief complaints consist of left hemiplegia and overall deconditioning leading to the following functional deficits: dependent for ADLs, IADLs, unable to transfer to car, dependent transfers at home with hoyer lift, unable to walk or stand without significant assistance, difficulty with W/C navigation..    Limitations Sitting;Lifting;Standing;House hold activities;Writing;Walking;Other (comment)    Diagnostic tests Brain MRI 11/01/2017: IMPRESSION: 1. Acute right ACA territory  infarct as demonstrated on prior CT imaging. No intracranial hemorrhage or significant mass effect. 2. 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    Patient Stated Goals wants to be able to walk again    Currently in Pain? No/denies                    TREATMENT:  When sitting in chair, patient exhibits heavy posterior/left lateral lean this session;  Educated patient in neutral weight shift while sitting in chair but patient unable to maintain or achieve; He reports he has been working on sitting in chair but has been getting help to hold himself in midline;  Patient transferred to mat table from wheelchair with hoyer lift. Utilized  patient's personal lift pad for ease of transfer;   Patient hooklying:   Rolling side to side x5 reps each direction with min A and min-mod VCS for LE/UE positioning for better rolling;    sidelying: Attempted pushing through top hand and sliding bottom arm under trunk x3 attempts (unable to fully get elbow under side)  Attempted pushing through LE and scooting hips back in sidelying x5 reps, unable to slide back but able to exhibit abdominal contraction;    Bridges with instruction to pull pants up x5 reps with good UE reach to lift pants up over hips.     Patient required min A +1 for transition supine to sidelying with cues for UE/LE positioning;  Patient required modA +34fr transition sidelying to sitting with cues for hand placement and trunk control; He initially tried to push through RUE but fatigued quickly and was unable to push self up into sitting without mod A +1;   Sitting edge of mat table: Able to sit unsupported with feet on floor with neutral weight shift with visual cues for correction, requiring min A-stand by assist for safety Instructed patient in reaching various directions with each UE to challenge trunk control x2 min;  PT educated patient/caregiver in how to place hoyer pad under patient in sitting with instruction for patient to laterally lean while therapist pulled pad under contralateral hip x4 reps each direction for proper pad placement; He did require mod A +2 for trunk control with max verbal cues for forward weight shift with lateral lean for better positioning;   Patient transferred from mat table to wheelchair with hoyer lift with good positioning in chair;    Pt response/clinical impression: Pt demonstrated goodmotivation throughout session.He was able to exhibit better LLE muscle activation this session. Focused on core/trunk strengthening for better trunk control. Patient continues to require min A with rolling and bed mobility  for proper positioning. He would benefit from additional education/instruction in optimal trunk control to increase ADL ability at home.                     PT Education - 01/26/20 1258    Education Details LE strengthening, transfers, trunk control;    Person(s) Educated Patient    Methods Explanation;Verbal cues    Comprehension Verbalized understanding;Returned demonstration;Verbal cues required;Need further instruction            PT Short Term Goals - 01/26/20 1308      PT SHORT TERM GOAL #1   Title Be independent with initial home exercise program for self-management of symptoms.    Baseline HEP to be given at second session; advice to practice unsupported sitting at home (06/19/2018); 07/08/18: Performing at home, 6/29: needs assistance  but is doing exercise program regularly; 08/22/18: adherent;    Time 2    Period Weeks    Status Achieved    Target Date 06/26/18             PT Long Term Goals - 01/26/20 1308      PT LONG TERM GOAL #1   Title Patient will complete all bed mobility with min A to improve functional independence for getting in and out of bed and adjusting in bed.     Baseline max A - total A reported by family (06/19/2018); 07/08/18: still requires maxA, dtr reports improvement in rolling since starting therapy; 6/23: min A to supervision in clinic; not doing at home right; 8/5: Pt's daughter states that his rolling has improved, but still has difficulty with all other bed mobility; 10/23/18: pts daughter states that she is doing about 35% of the work if he is in proper position, he does uses the bed rails to help roll;  using Hoyer for coming to sit EOB; 10/12: pts daughter states that she is doing about 30% of the work if he is in proper position, he does uses the bed rails to help roll, but it is improving;  using Hoyer for coming to sit EOB, 10/29: min A for sit to sidelying, rolling supervision, able to transition sidelying to sit with HHA; 12/21:  min-mod A    Time 12    Period Weeks    Status Partially Met    Target Date 04/19/20      PT LONG TERM GOAL #2   Title Patient will complete sit <> stand transfer chair to chair with Min A and LRAD to improve functional independence for household and community mobility.     Baseline min A for STS, still requires elevated surface height to 27"    Time 12    Period Weeks    Status Partially Met    Target Date 04/19/20      PT LONG TERM GOAL #3   Title Patient will navigate power w/c with min A x 100 feet to improve mobility for household and short community distances.    Baseline required total A (06/12/2018); able to roll 20 feet with min A (06/20/2018); 07/08/18: Pt can go approximately 10' without assist per family;  10/23/2018: pt's daughter states that his power WC mobility has become 100% better with the new hand control, slowed speed, and with practice; 10/12: pt's daughter reports that he can perform household WC mobility and limited community ambulation with minA    Time 12    Period Weeks    Status Achieved    Target Date 04/19/20      PT LONG TERM GOAL #4   Title Patient will complete car transfers W/C to family SUV with min A using LRAD to improve his abilty to participate in community activities.     Baseline unable to complete car transfers at all (06/12/2018); unable to complete car transfers at all (06/19/2018).; 07/08/18: Unable to perform at this time; 09/11/18: unable to perform at this time; 10/23/2018: unable to complete car transfers at all; 10/12:  unable to complete car transfers at all, 10/29: unable; 01/13/19: unable; 2/11: min-mod A +2, 3/10: min A+1, 12/21: mod A    Time 12    Period Weeks    Status Deferred    Target Date 04/19/20      PT LONG TERM GOAL #5   Title Patient will ambulate at least 10 feet  with min A using LRAD to improve mobility for household and community distances.    Baseline unable to take steps (06/12/2018); able to shift R leg forward and back with max A  and RW at edge of plinth (06/19/2018); 07/08/18: unable to ambulate at this time. 09/11/18: unable to perform at this time; 10/23/2018: unable to perform at this time; 10/12: unable to perform at this time, 10/29: unable at this time; 01/13/19: unable at this time, 2/11: min A to step with RLE, unable to step with LLE, 3/10: able to take 2 steps in parallel bars with min A for safety; 4/1: min A for walking 10 feet in parallel bars, 5/24: min A walking 10 feet in parallel bars; 6/21: no change 7/14: no change    Time 12    Period Weeks    Status Deferred    Target Date 04/19/20      PT LONG TERM GOAL #6   Title Patient will be able to safety navigate up/down ramp with power chair, exhibiting good safety awareness, mod I to safely enter/exit his home.    Baseline 2/11: supervision with intermittent min A;4/1: supervision; 5/24: supervision    Time 12    Period Weeks    Status Partially Met    Target Date 04/19/20      PT LONG TERM GOAL #7   Title Patient will increase functional reach test to >15 inches in sitting to exhibit improved sitting balance and positioning and reduce fall risk;    Baseline 07/30/18: 12 inches; 09/11/18: 10-12 inches; 10/23/18: 12-13 inch; 10/12: 14.5", 10/29: 15.5 inch    Time 4    Period Weeks    Status Achieved    Target Date 04/19/20      PT LONG TERM GOAL #8   Title Patient will increase LLE quad strength to 3+/5 to improve functional strength for standing and mobility;    Baseline 10/28: 2+/5; 01/13/19: 2+/5, 2/11: 3-/5, 04/16/19: 3-/5, 05/08/19: 3-/5, 5/24: 3-/5, 6/21: 3-/5, 7/14: 3-/5, 12/21: 3-/5    Time 12    Period Weeks    Status Partially Met    Target Date 04/19/20      PT LONG TERM GOAL  #9   TITLE Patient will report no vertigo with provoking motions or positions in order to be able to perform ADLs and mobility tasks without symptoms.    Baseline 06/30/19: reports no dizziness in last week; 6/21: one episode this weekend; 7/14: experiencing one a week;    Time  12    Period Weeks    Status Partially Met    Target Date 04/19/20                 Plan - 01/27/20 0920    Clinical Impression Statement Patient educated in bed mobility and trunk control in sitting this session. He does require increased time with rolling and min A for proper positioning. Patient exhibits decreased LLE muscle activation this session likely due to increased fatigue from not sleeping well last night. He reports having increased restless leg activity which affects his sleeping. Patient also requires mod A +1 for transition sidelying to sitting. When sitting unsupported he exhibits increased posterior lean requiring min A and mod Vcs for weight shift to neutral position. Educated patient and caregiver in how to position lift sling under patient in sitting. He was able to shift/lean side/side better this session but continues to require minA for forward weight shift for better positioning. He reports increased  fatigue at end of session. He would benefit from additional skilled PT Intervention to improve bed mobility/sitting balance for increased ADL participation and mobility;    Personal Factors and Comorbidities Age;Comorbidity 3+    Comorbidities Relevant past medical history and comorbidities include long term steroid use for lupus, CKD, chronic osteomyelitis, cervical spine stenosis, BPH, APS, alcohol abuse, peripheral artery disease, depression, GERD, obstructive sleep apnea, HTN, IBS, IgA deficiency, lumbar radiculopathy, RA, Stroke, toxic maculopathy in both eyes, systemic lupus erythematosus related syndrome, cardiac catheterization, carotid endarterectomy, cervical laminectomy, coronary angioplasty, Fusion C5-C7, knee arthroplasty, lumbar surgery.    Examination-Activity Limitations Bed Mobility;Bend;Sit;Toileting;Stand;Stairs;Lift;Transfers;Squat;Locomotion Level;Carry;Dressing;Hygiene/Grooming;Continence    Examination-Participation Restrictions Yard Work;Interpersonal  Relationship;Community Activity    Stability/Clinical Decision Making Evolving/Moderate complexity    Rehab Potential Fair    PT Frequency 3x / week    PT Duration 12 weeks    PT Treatment/Interventions ADLs/Self Care Home Management;Electrical Stimulation;Moist Heat;Cryotherapy;Gait training;Stair training;Functional mobility training;Therapeutic exercise;Balance training;Neuromuscular re-education;Cognitive remediation;Patient/family education;Orthotic Fit/Training;Wheelchair mobility training;Manual techniques;Passive range of motion;Energy conservation;Joint Manipulations;Canalith Repostioning;Therapeutic activities;Vestibular    PT Next Visit Plan Work on strength, sitting/standing balance, functional activities such as rolling, bed mobility, transfers; caregiver training    PT Home Exercise Plan no updates this session    Consulted and Agree with Plan of Care Patient;Family member/caregiver    Family Member Consulted DTR           Patient will benefit from skilled therapeutic intervention in order to improve the following deficits and impairments:  Abnormal gait,Decreased activity tolerance,Decreased cognition,Decreased endurance,Decreased knowledge of use of DME,Decreased range of motion,Decreased skin integrity,Decreased strength,Impaired perceived functional ability,Impaired sensation,Impaired UE functional use,Improper body mechanics,Pain,Cardiopulmonary status limiting activity,Decreased balance,Decreased coordination,Decreased mobility,Difficulty walking,Impaired tone,Postural dysfunction,Dizziness  Visit Diagnosis: Muscle weakness (generalized)  Other lack of coordination  Difficulty in walking, not elsewhere classified  Repeated falls     Problem List Patient Active Problem List   Diagnosis Date Noted  . Sepsis ([place]) 10/18/2018    Askia Hazelip PT, DPT 01/27/2020, 9:25 AM  [place] MAIN [facility] SERVICES [address]  [place], [place], [address] Phone: [phone]   Fax:  [phone]  Name: Kenneth Summers MRN: [id number] Date of Birth: 03-28-1945

## 2020-01-26 NOTE — Therapy (Signed)
[place] MAIN [facility] SERVICES [address] [place], [place], [address] Phone: [phone]   Fax:  [phone]  Occupational Therapy Treatment  Patient Details  Name: Kenneth Summers MRN: [id number] Date of Birth: 07-18-1945 No data recorded  Encounter Date: 01/26/2020   OT End of Session - 01/26/20 1521    Visit Number 161    Number of Visits 181    Authorization Type Progress report period starting 01/26/2020    OT Start Time 1345    OT Stop Time 1430    OT Time Calculation (min) 45 min    Activity Tolerance Patient tolerated treatment well    Behavior During Therapy [facility] for tasks assessed/performed           Past Medical History:  Diagnosis Date  . Actinic keratosis   . Alcohol abuse   . APS (antiphospholipid syndrome) ([place])   . Basal cell carcinoma   . BPH (benign prostatic hyperplasia)   . CAD (coronary artery disease)   . Cellulitis   . Cervical spinal stenosis   . Chronic osteomyelitis ([place])   . CKD (chronic kidney disease)   . CKD (chronic kidney disease)   . Clostridium difficile diarrhea   . Depression   . Diplopia   . Fatigue   . GERD (gastroesophageal reflux disease)   . Hyperlipemia   . Hypertension   . Hypovitaminosis D   . IBS (irritable bowel syndrome)   . 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 Insomnia   . Left lumbosacral radiculopathy   . Moderate obstructive sleep apnea   . Osteomyelitis of foot ([place])   . PAD (peripheral artery disease) ([place])   . Pruritus   . RA (rheumatoid arthritis) ([place])   . Radiculopathy   . Restless legs syndrome   . RLS (restless legs syndrome)   . SLE (systemic lupus erythematosus related syndrome) ([place])   . 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 Stroke ([place])   . Toxic maculopathy of both eyes     Past Surgical History:  Procedure Laterality Date  . CARDIAC CATHETERIZATION    . CAROTID ENDARTERECTOMY    . CERVICAL LAMINECTOMY    . CORONARY  ANGIOPLASTY    . FRACTURE SURGERY    . fusion C5-6-7    . HERNIA REPAIR    . KNEE ARTHROSCOPY    . TONSILLECTOMY      There were no vitals filed for this visit.   Subjective Assessment - 01/26/20 1520    Subjective  Pt. reports that his left hand is sore today    Patient is accompanied by: Family member    Pertinent History Pt. is a 74 y.o. 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 Pt. PMHx includes: Multiple Falls, Lupus, DJD, Renal Abscess, CVA. Pt. resides with his wife. Pt.'s wife and daughter assist with ADLs. Pt. has caregivers in  for 2 hours a day, 6 days a week. Pt. received Rehab services in acute care, at SNF for STR, and [facility] services. Pt. is retired from [employer] for [employer] and [employer].    Currently in Pain? No/denies          OT Treatment  Self-care:  Pt. worked on UE dressing skills donning his r jacket. Pt. required mod assist to donn his jacket, with verbal cues and assist to initiate threadinghis LUE. Pt. required increased time, andstep by stepcues forinstruction  throughout the task. Pt. Continues to Initially place the hooded sweatshirt on backwards, and requires consisitent cues for the right direction.Pt. required modA to manage his zipper, requiring cues to engage his left hand in stabilizing the jacket. Pt. had difficulty engaging his LUE when manipulating the zipper.  There. Ex.  Pt. performed 2# dowel ex. For UE strengthening secondary to weakness. Bilateral shoulder flexion, chest press, circular patterns, and elbow flexion/extension were performed for 2 sets 10-20 reps each. Pt. required cues to keep the bar straight. Pt. perfromed 2# dumbbell ex. for elbow flexion and extension, forearm supination/pronation, wrist flexion/extension, and radial deviation. Pt. requires rest breaks and verbal cues for proper technique.  Neuromuscular re-ed:  Pt. worked on using his LUE for reaching in various planes  for cones. Pt. required cues for bring his trunk forward, and for trunk rotation when crossing midline.  Pt. reports that his left hand has been stiff today due to the weather getting colder. Pt. was able to engage it to stabilize the jacket while zipping with verbal cues. Pt. continues to require increased cues for each step of the task. Pt. continues to benefit from additional work on improving UE dressing skills. Pt. continues to work on improving UE strength, [facility] skills, and visual perceptual skills in order to improve, and maximize independence with ADLs, and IADL tasks.                       OT Education - 01/26/20 1521    Education Details BUE ther. ex    Person(s) Educated Patient    Methods Explanation;Demonstration    Comprehension Returned demonstration;Tactile cues required;Verbalized understanding;Need further instruction               OT Long Term Goals - 01/22/20 1329      OT LONG TERM GOAL #1   Title Pt. will increase left shoulder flexion AROM by 10 degrees to access cabinet/shelf.    Baseline Pt. continues to progress with left shoulder ROM .Shouler flexion 130, and continues to work on reaching with increasing emphasis on flexing trunk to improve functional reach.  07/21/19- shoulder flexion to 130 with effort    Time 12    Period Weeks    Status Not Met    Target Date 03/01/20      OT LONG TERM GOAL #2   Title Pt. will donn a shirt with Supervision.    Baseline Pt. continues to require modA donning a zip down jacket with consistent cues to initiate the correct direction of the jacket.  Min-modA mahaging the zipper. MinA for donning a T-shirt with assist to roll side to side to pull the shirt down over his back. 07/21/19 continues to require min assist at times for shirt/jacket    Time 12    Period Weeks    Status On-going    Target Date 03/01/20      OT LONG TERM GOAL #4   Title Pt. will improve left grip strength by 10# to be able to open a  jar/container.    Baseline Pt. is making stedy progress with grip strength. Pt. is having a herder time opening a wide mouthed jar, however is able to open a smaller jar easier.    Time 12    Period Weeks    Status On-going    Target Date 03/02/19      OT LONG TERM GOAL #5   Title Pt. will improve left hand [facility] skills to  be able to assist with buttoning/zipping.    Baseline Pt. has improved overall donning a jacket, and  manipulating the zipper on his jacket, however continues to require practice depending on the type of zipper jacket. Pt. continues to work on improving bilateral [facility] skills for buttoning/zipping.  07/21/19 improving with buttons and zippers.    Time 12    Period Weeks    Status On-going    Target Date 03/01/20      OT LONG TERM GOAL #6   Title Pt. will demonstrate visual conmpensatory strategies for 100% of the time during ADLs    Baseline Pt.continues to require intermittent cues cues for left sides awareness.    Time 12    Period Weeks    Status Partially Met    Target Date 03/01/20      OT LONG TERM GOAL #8   Title Pt. will accurately identify potential safety hazard using good safety awareness, and judgement 100% for ADLs, and IADLs.    Baseline Pt. continues to require cues    Time 12    Period Weeks    Status On-going    Target Date 03/01/20      OT LONG TERM GOAL  #9   TITLE Pt. will  navigate his w/c around obstacles with Supervision and 100% accuracy    Baseline Pt. continues to require cuing for obstacles on the left, however requires less cues overall. Pt. requires significant cues to engage his left UE during tasks. 07/21/2019 continues to improve with decreased assist at times    Time 12    Period Weeks    Status On-going    Target Date 03/01/20      OT LONG TERM GOAL  #10   TITLE Pt. will independently be able  to reach to place items into cabinetry, and closets.    Baseline Pt. continues to have difficulty with functional reaching conitnues to be  limited.    Time 12    Period Weeks    Status On-going    Target Date 03/01/20      OT LONG TERM GOAL  #11   TITLE Pt. will increase BUE strength by 82mm grades in preparation for ADL transfers.    Baseline Pt. BUE strength conitnues to be limited. Pt. is able to tolerate increased resistive weights. Pt. continues to work towards improving overall strength for ADLs.    Time 12    Period Weeks    Status On-going    Target Date 03/01/20      OT LONG TERM GOAL  #12   TITLE Pt. will improve LUE functional reaching with the left engaging the trunk 100% of the time during ADL tasks with minimal cues.    Baseline Pt. continues to improve with LUE functional reaching. Fewer cues are required to engage his trunk for extended reaching.    Time 12    Period Weeks    Status On-going    Target Date 03/01/20                 Plan - 01/26/20 1522    Clinical Impression Statement  Pt. reports that his left hand has been stiff today due to the weather getting colder. Pt. was able to engage it to stabilize the jacket while zipping with verbal cues. Pt. continues to require increased cues for each step of the task. Pt. continues to benefit from additional work on improving UE dressing skills. Pt. continues to work on improving UE strength,  [facility] skills, and visual perceptual skills in order to improve, and maximize independence with ADLs, and IADL tasks.   OT Occupational Profile and History Problem Focused Assessment - Including review of records relating to presenting problem    Occupational performance deficits (Please refer to evaluation for details): ADL's    Body Structure / Function / Physical Skills UE functional use;Coordination;FMC;Dexterity;Strength;ROM    Cognitive Skills Attention;Memory;Emotional;Problem Solve;Safety Awareness    Rehab Potential Good    Clinical Decision Making Several treatment options, min-mod task modification necessary    Comorbidities Affecting Occupational  Performance: Presence of comorbidities impacting occupational performance    Comorbidities impacting occupational performance description: Phyical, cognitive, visual,  medical comorbidities    Modification or Assistance to Complete Evaluation  No modification of tasks or assist necessary to complete eval    OT Frequency 2x / week    OT Duration 12 weeks    OT Treatment/Interventions Self-care/ADL training;DME and/or AE instruction;Therapeutic exercise;Therapeutic activities;Moist Heat;Cognitive remediation/compensation;Neuromuscular education;Visual/perceptual remediation/compensation;Coping strategies training;Patient/family education;Passive range of motion;Psychosocial skills training;Energy conservation;Functional Mobility Training    Consulted and Agree with Plan of Care Patient;Family member/caregiver           Patient will benefit from skilled therapeutic intervention in order to improve the following deficits and impairments:   Body Structure / Function / Physical Skills: UE functional use,Coordination,FMC,Dexterity,Strength,ROM Cognitive Skills: Attention,Memory,Emotional,Problem Solve,Safety Awareness     Visit Diagnosis: Muscle weakness (generalized)  Other lack of coordination    Problem List Patient Active Problem List   Diagnosis Date Noted  . Sepsis ([place]) 10/18/2018    Harrel Carina, MS, OTR/L 01/26/2020, 3:24 PM  [place] MAIN [facility] SERVICES [address] [place], [place], [address] Phone: [phone]   Fax:  [phone]  Name: Kenneth Summers MRN: [id number] Date of Birth: July 28, 1945

## 2020-01-28 ENCOUNTER — Other Ambulatory Visit: Payer: Self-pay

## 2020-01-28 ENCOUNTER — Ambulatory Visit: Payer: Medicare Other | Admitting: Physical Therapy

## 2020-01-28 ENCOUNTER — Encounter: Payer: Self-pay | Admitting: Physical Therapy

## 2020-01-28 DIAGNOSIS — M6281 Muscle weakness (generalized): Secondary | ICD-10-CM

## 2020-01-28 DIAGNOSIS — R278 Other lack of coordination: Secondary | ICD-10-CM

## 2020-01-28 DIAGNOSIS — R262 Difficulty in walking, not elsewhere classified: Secondary | ICD-10-CM

## 2020-01-28 DIAGNOSIS — R296 Repeated falls: Secondary | ICD-10-CM

## 2020-01-28 NOTE — Therapy (Signed)
[facility] [address] [place], [place], [address] Phone: [phone]   Fax:  [phone]  Physical Therapy Treatment  Patient Details  Name: Kenneth Summers MRN: [id number] Date of Birth: 09/29/1945 No data recorded  Encounter Date: 01/28/2020   PT End of Session - 01/28/20 1314    Visit Number [id number]    Number of Visits 252    Date for PT Re-Evaluation 04/19/20    Authorization Type Medicare reporting period starting 09/11/18; NO FOTO    Authorization Time Period goals last assessed 08/20/19    PT Start Time 1302    PT Stop Time 1345    PT Time Calculation (min) 43 min    Equipment Utilized During Treatment Gait belt    Activity Tolerance Patient tolerated treatment well;No increased pain    Behavior During Therapy WFL for tasks assessed/performed           Past Medical History:  Diagnosis Date  . Actinic keratosis   . Alcohol abuse   . APS (antiphospholipid syndrome) ([place])   . Basal cell carcinoma   . BPH (benign prostatic hyperplasia)   . CAD (coronary artery disease)   . Cellulitis   . Cervical spinal stenosis   . Chronic osteomyelitis ([place])   . CKD (chronic kidney disease)   . CKD (chronic kidney disease)   . Clostridium difficile diarrhea   . Depression   . Diplopia   . Fatigue   . GERD (gastroesophageal reflux disease)   . Hyperlipemia   . Hypertension   . Hypovitaminosis D   . IBS (irritable bowel syndrome)   . 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 Insomnia   . Left lumbosacral radiculopathy   . Moderate obstructive sleep apnea   . Osteomyelitis of foot ([place])   . PAD (peripheral artery disease) ([place])   . Pruritus   . RA (rheumatoid arthritis) ([place])   . Radiculopathy   . Restless legs syndrome   . RLS (restless legs syndrome)   . SLE (systemic lupus erythematosus related syndrome) ([place])   . 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 Stroke ([place])   . Toxic maculopathy  of both eyes     Past Surgical History:  Procedure Laterality Date  . CARDIAC CATHETERIZATION    . CAROTID ENDARTERECTOMY    . CERVICAL LAMINECTOMY    . CORONARY ANGIOPLASTY    . FRACTURE SURGERY    . fusion C5-6-7    . HERNIA REPAIR    . KNEE ARTHROSCOPY    . TONSILLECTOMY      There were no vitals filed for this visit.   Subjective Assessment - 01/28/20 1313    Subjective Patient reports doing well;He reports increased headache currently; Reports headache is mild;    Pertinent History Patient is a 74 y.o. male who presents to outpatient physical therapy with a referral for medical diagnosis of CVA. This patient's chief complaints consist of left hemiplegia and overall deconditioning leading to the following functional deficits: dependent for ADLs, IADLs, unable to transfer to car, dependent transfers at home with hoyer lift, unable to walk or stand without significant assistance, difficulty with W/C navigation..    Limitations Sitting;Lifting;Standing;House hold activities;Writing;Walking;Other (comment)    Diagnostic tests Brain MRI 11/01/2017: IMPRESSION: 1. Acute right ACA territory infarct as demonstrated on prior CT imaging. No intracranial hemorrhage or significant mass effect. 2. 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    Patient Stated Goals wants to be able to walk again    Currently in Pain? Yes    Pain Score 2     Pain Location Head    Pain Orientation Posterior    Pain Descriptors / Indicators Aching;Headache    Pain Type Acute pain    Pain Onset Today    Pain Frequency Intermittent    Aggravating Factors  unsure    Pain Relieving Factors medication/rest    Effect of Pain on Daily Activities decreased activity tolerance;    Multiple Pain Sites No                 TREATMENT:  Patient transferred to mat table from wheelchair with hoyer lift. Utilized patient's personal lift pad for ease of transfer;  Patient hooklying:    Rolling side to side x3 reps each direction with min A and min-mod VCS for LE/UE positioning for better rolling;   Hooklying: Hip flexion marchwith contralateral UE reach to challenge core abdominal contractionx10 reps each LE with visual cues to increase ROM, AAROM on LLE;   Rolling from supine to right sidelying with mod VCS for reaching with LUE and turning head and keeping LLE hip engaged for better rolling;   Patient required min A +1 for transition supine to sidelying with cues for UE/LE positioning;  Patient required modA +67for transition sidelying to sitting with cues for hand placement and trunk control; He initially tried to push through RUE but fatigued quickly and was unable to push self up into sitting without mod A +1;   Sitting edge of mat table: Able to sit unsupported with feet on floor with neutral weight shift with visual cues for correction, requiring min A-stand by assistfor safety Instructed patient in reaching various directions with each UE to challenge trunk control x2 min; Instructed patient in catching ball and tossing x10 reps with caregiver and therapist providing min A for safety and positioning/reorienting to midline; Patient sitting with feet on floor/unsupported reaching with ball passing side/side with cues to avoid posterior loss of balance x5 reps;   PT educated patient/caregiver in how to place hoyer pad under patient in sitting with instruction for patient to laterally lean while therapist pulled pad under contralateral hip x4 reps each direction for proper pad placement; He did require mod A +2 for trunk control with max verbal cues for forward weight shift with lateral lean for better positioning;  Patient transferredfrom mat table to wheelchair with hoyer lift with good positioning in chair;   Pt response/clinical impression: Pt demonstrated goodmotivation throughout session.Hewas able to exhibit better LLE muscle activation  this session. Focused on core/trunk strengthening for better trunk control. Patient continues to require min A with rolling and bed mobility for proper positioning. He would benefit from additional education/instruction in optimal trunk control to increase ADL ability at home.                        PT Education - 01/28/20 1314    Education Details bed mobility, positioning, trunk control;    Person(s) Educated Patient    Methods Explanation;Verbal cues    Comprehension Verbalized understanding;Returned demonstration;Verbal cues required;Need further instruction            PT Short Term Goals - 01/26/20 1308      PT SHORT TERM GOAL #1   Title Be independent with initial home exercise program for self-management of symptoms.    Baseline  HEP to be given at second session; advice to practice unsupported sitting at home (06/19/2018); 07/08/18: Performing at home, 6/29: needs assistance but is doing exercise program regularly; 08/22/18: adherent;    Time 2    Period Weeks    Status Achieved    Target Date 06/26/18             PT Long Term Goals - 01/26/20 1308      PT LONG TERM GOAL #1   Title Patient will complete all bed mobility with min A to improve functional independence for getting in and out of bed and adjusting in bed.     Baseline max A - total A reported by family (06/19/2018); 07/08/18: still requires maxA, dtr reports improvement in rolling since starting therapy; 6/23: min A to supervision in clinic; not doing at home right; 8/5: Pt's daughter states that his rolling has improved, but still has difficulty with all other bed mobility; 10/23/18: pts daughter states that she is doing about 35% of the work if he is in proper position, he does uses the bed rails to help roll;  using Hoyer for coming to sit EOB; 10/12: pts daughter states that she is doing about 30% of the work if he is in proper position, he does uses the bed rails to help roll, but it is improving;   using Hoyer for coming to sit EOB, 10/29: min A for sit to sidelying, rolling supervision, able to transition sidelying to sit with HHA; 12/21: min-mod A    Time 12    Period Weeks    Status Partially Met    Target Date 04/19/20      PT LONG TERM GOAL #2   Title Patient will complete sit <> stand transfer chair to chair with Min A and LRAD to improve functional independence for household and community mobility.     Baseline min A for STS, still requires elevated surface height to 27"    Time 12    Period Weeks    Status Partially Met    Target Date 04/19/20      PT LONG TERM GOAL #3   Title Patient will navigate power w/c with min A x 100 feet to improve mobility for household and short community distances.    Baseline required total A (06/12/2018); able to roll 20 feet with min A (06/20/2018); 07/08/18: Pt can go approximately 10' without assist per family;  10/23/2018: pt's daughter states that his power WC mobility has become 100% better with the new hand control, slowed speed, and with practice; 10/12: pt's daughter reports that he can perform household WC mobility and limited community ambulation with minA    Time 12    Period Weeks    Status Achieved    Target Date 04/19/20      PT LONG TERM GOAL #4   Title Patient will complete car transfers W/C to family SUV with min A using LRAD to improve his abilty to participate in community activities.     Baseline unable to complete car transfers at all (06/12/2018); unable to complete car transfers at all (06/19/2018).; 07/08/18: Unable to perform at this time; 09/11/18: unable to perform at this time; 10/23/2018: unable to complete car transfers at all; 10/12:  unable to complete car transfers at all, 10/29: unable; 01/13/19: unable; 2/11: min-mod A +2, 3/10: min A+1, 12/21: mod A    Time 12    Period Weeks    Status Deferred    Target  Date 04/19/20      PT LONG TERM GOAL #5   Title Patient will ambulate at least 10 feet with min A using LRAD to  improve mobility for household and community distances.    Baseline unable to take steps (06/12/2018); able to shift R leg forward and back with max A and RW at edge of plinth (06/19/2018); 07/08/18: unable to ambulate at this time. 09/11/18: unable to perform at this time; 10/23/2018: unable to perform at this time; 10/12: unable to perform at this time, 10/29: unable at this time; 01/13/19: unable at this time, 2/11: min A to step with RLE, unable to step with LLE, 3/10: able to take 2 steps in parallel bars with min A for safety; 4/1: min A for walking 10 feet in parallel bars, 5/24: min A walking 10 feet in parallel bars; 6/21: no change 7/14: no change    Time 12    Period Weeks    Status Deferred    Target Date 04/19/20      PT LONG TERM GOAL #6   Title Patient will be able to safety navigate up/down ramp with power chair, exhibiting good safety awareness, mod I to safely enter/exit his home.    Baseline 2/11: supervision with intermittent min A;4/1: supervision; 5/24: supervision    Time 12    Period Weeks    Status Partially Met    Target Date 04/19/20      PT LONG TERM GOAL #7   Title Patient will increase functional reach test to >15 inches in sitting to exhibit improved sitting balance and positioning and reduce fall risk;    Baseline 07/30/18: 12 inches; 09/11/18: 10-12 inches; 10/23/18: 12-13 inch; 10/12: 14.5", 10/29: 15.5 inch    Time 4    Period Weeks    Status Achieved    Target Date 04/19/20      PT LONG TERM GOAL #8   Title Patient will increase LLE quad strength to 3+/5 to improve functional strength for standing and mobility;    Baseline 10/28: 2+/5; 01/13/19: 2+/5, 2/11: 3-/5, 04/16/19: 3-/5, 05/08/19: 3-/5, 5/24: 3-/5, 6/21: 3-/5, 7/14: 3-/5, 12/21: 3-/5    Time 12    Period Weeks    Status Partially Met    Target Date 04/19/20      PT LONG TERM GOAL  #9   TITLE Patient will report no vertigo with provoking motions or positions in order to be able to perform ADLs and mobility  tasks without symptoms.    Baseline 06/30/19: reports no dizziness in last week; 6/21: one episode this weekend; 7/14: experiencing one a week;    Time 12    Period Weeks    Status Partially Met    Target Date 04/19/20                 Plan - 01/28/20 1323    Clinical Impression Statement Patient motivated and participated well within session. He was instructed in increased bed mobility working on rolling and positioning. He continues to have difficulty pushing through UE and weakness with poor trunk control; Patient continues to require min A-mod A for most positioning. He was instructed in core strengthening exercise requiring min A for LLE positioning and mod VCs for sequencing and positioning. Patient does exhibit increased posterior right side lean this session. He requires CGA to close supervision with sitting balance with cues for neutral weight shift. He often exhibits a posterior pelvic tilt with rounded posture with prolonged unsupported sitting  requiring cues for erect posture. Patient and caregiver educated in tilting for placing sling under hips for preparing for hoyer transfer in sitting. He would benefit from additional skilled PT intervention to improve strength, balance and mobility;    Personal Factors and Comorbidities Age;Comorbidity 3+    Comorbidities Relevant past medical history and comorbidities include long term steroid use for lupus, CKD, chronic osteomyelitis, cervical spine stenosis, BPH, APS, alcohol abuse, peripheral artery disease, depression, GERD, obstructive sleep apnea, HTN, IBS, IgA deficiency, lumbar radiculopathy, RA, Stroke, toxic maculopathy in both eyes, systemic lupus erythematosus related syndrome, cardiac catheterization, carotid endarterectomy, cervical laminectomy, coronary angioplasty, Fusion C5-C7, knee arthroplasty, lumbar surgery.    Examination-Activity Limitations Bed Mobility;Bend;Sit;Toileting;Stand;Stairs;Lift;Transfers;Squat;Locomotion  Level;Carry;Dressing;Hygiene/Grooming;Continence    Examination-Participation Restrictions Yard Work;Interpersonal Relationship;Community Activity    Stability/Clinical Decision Making Evolving/Moderate complexity    Rehab Potential Fair    PT Frequency 3x / week    PT Duration 12 weeks    PT Treatment/Interventions ADLs/Self Care Home Management;Electrical Stimulation;Moist Heat;Cryotherapy;Gait training;Stair training;Functional mobility training;Therapeutic exercise;Balance training;Neuromuscular re-education;Cognitive remediation;Patient/family education;Orthotic Fit/Training;Wheelchair mobility training;Manual techniques;Passive range of motion;Energy conservation;Joint Manipulations;Canalith Repostioning;Therapeutic activities;Vestibular    PT Next Visit Plan Work on strength, sitting/standing balance, functional activities such as rolling, bed mobility, transfers; caregiver training    PT Home Exercise Plan no updates this session    Consulted and Agree with Plan of Care Patient;Family member/caregiver    Family Member Consulted DTR           Patient will benefit from skilled therapeutic intervention in order to improve the following deficits and impairments:  Abnormal gait,Decreased activity tolerance,Decreased cognition,Decreased endurance,Decreased knowledge of use of DME,Decreased range of motion,Decreased skin integrity,Decreased strength,Impaired perceived functional ability,Impaired sensation,Impaired UE functional use,Improper body mechanics,Pain,Cardiopulmonary status limiting activity,Decreased balance,Decreased coordination,Decreased mobility,Difficulty walking,Impaired tone,Postural dysfunction,Dizziness  Visit Diagnosis: Muscle weakness (generalized)  Other lack of coordination  Difficulty in walking, not elsewhere classified  Repeated falls     Problem List Patient Active Problem List   Diagnosis Date Noted  . Sepsis ([place]) 10/18/2018    Kenneth Summers PT,  DPT 01/28/2020, 1:48 PM  [place] [facility] SERVICES [address] [place], [place], [address] Phone: [phone]   Fax:  [phone]  Name: Kenneth Summers MRN: [id number] Date of Birth: 07/14/1945

## 2020-01-29 ENCOUNTER — Ambulatory Visit: Payer: Medicare Other | Admitting: Physical Therapy

## 2020-01-29 ENCOUNTER — Ambulatory Visit: Payer: Medicare Other | Admitting: Occupational Therapy

## 2020-02-02 ENCOUNTER — Ambulatory Visit: Payer: Medicare Other | Admitting: Occupational Therapy

## 2020-02-02 ENCOUNTER — Ambulatory Visit: Payer: Medicare Other | Admitting: Physical Therapy

## 2020-02-04 ENCOUNTER — Encounter: Payer: Self-pay | Admitting: Physical Therapy

## 2020-02-04 ENCOUNTER — Ambulatory Visit: Payer: Medicare Other

## 2020-02-04 ENCOUNTER — Other Ambulatory Visit: Payer: Self-pay

## 2020-02-04 DIAGNOSIS — I69354 Hemiplegia and hemiparesis following cerebral infarction affecting left non-dominant side: Secondary | ICD-10-CM

## 2020-02-04 DIAGNOSIS — R262 Difficulty in walking, not elsewhere classified: Secondary | ICD-10-CM

## 2020-02-04 DIAGNOSIS — R296 Repeated falls: Secondary | ICD-10-CM

## 2020-02-04 DIAGNOSIS — M6281 Muscle weakness (generalized): Secondary | ICD-10-CM | POA: Diagnosis not present

## 2020-02-04 DIAGNOSIS — R278 Other lack of coordination: Secondary | ICD-10-CM

## 2020-02-04 NOTE — Therapy (Signed)
[place] MAIN [facility] SERVICES [address] [place], [place], [address] Phone: [phone]   Fax:  [phone]  Physical Therapy Treatment  Patient Details  Name: Kenneth Summers MRN: [id number] Date of Birth: 02-10-1945 No data recorded  Encounter Date: 02/04/2020   PT End of Session - 02/04/20 1410    Visit Number [id number]    Number of Visits 252    Date for PT Re-Evaluation 04/19/20    Authorization Type Medicare reporting period starting 09/11/18; NO FOTO    Authorization Time Period goals last assessed 08/20/19    PT Start Time 0100    PT Stop Time 0147    PT Time Calculation (min) 47 min    Equipment Utilized During Treatment Gait belt    Activity Tolerance Patient tolerated treatment well;No increased pain    Behavior During Therapy WFL for tasks assessed/performed           Past Medical History:  Diagnosis Date  . Actinic keratosis   . Alcohol abuse   . APS (antiphospholipid syndrome) ([place])   . Basal cell carcinoma   . BPH (benign prostatic hyperplasia)   . CAD (coronary artery disease)   . Cellulitis   . Cervical spinal stenosis   . Chronic osteomyelitis ([place])   . CKD (chronic kidney disease)   . CKD (chronic kidney disease)   . Clostridium difficile diarrhea   . Depression   . Diplopia   . Fatigue   . GERD (gastroesophageal reflux disease)   . Hyperlipemia   . Hypertension   . Hypovitaminosis D   . IBS (irritable bowel syndrome)   . 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 Insomnia   . Left lumbosacral radiculopathy   . Moderate obstructive sleep apnea   . Osteomyelitis of foot ([place])   . PAD (peripheral artery disease) ([place])   . Pruritus   . RA (rheumatoid arthritis) ([place])   . Radiculopathy   . Restless legs syndrome   . RLS (restless legs syndrome)   . SLE (systemic lupus erythematosus related syndrome) ([place])   . 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 Stroke ([place])   . Toxic maculopathy  of both eyes     Past Surgical History:  Procedure Laterality Date  . CARDIAC CATHETERIZATION    . CAROTID ENDARTERECTOMY    . CERVICAL LAMINECTOMY    . CORONARY ANGIOPLASTY    . FRACTURE SURGERY    . fusion C5-6-7    . HERNIA REPAIR    . KNEE ARTHROSCOPY    . TONSILLECTOMY      There were no vitals filed for this visit.   Subjective Assessment - 02/04/20 1410    Subjective The patient reports that he is doing well.    Pertinent History Patient is a 74 y.o. male who presents to outpatient physical therapy with a referral for medical diagnosis of CVA. This patient's chief complaints consist of left hemiplegia and overall deconditioning leading to the following functional deficits: dependent for ADLs, IADLs, unable to transfer to car, dependent transfers at home with hoyer lift, unable to walk or stand without significant assistance, difficulty with W/C navigation..    Limitations Sitting;Lifting;Standing;House hold activities;Writing;Walking;Other (comment)    Diagnostic tests Brain MRI 11/01/2017: IMPRESSION: 1. Acute right ACA territory infarct as demonstrated on prior CT imaging. No intracranial hemorrhage or significant mass effect. 2. 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  Patient Stated Goals wants to be able to walk again    Currently in Pain? No/denies    Pain Score 0-No pain    Pain Onset Today             TREATMENT:  Patient transferred to mat table from wheelchair with hoyer lift. Utilized patient's personal lift pad for ease of transfer;  Patient hooklying:  Rolling side to side x4 reps each direction with min A and min-mod VCS for LE/UE positioning for better rolling; mod cueing for left leg positioning  Hooklying: Hip flexion marchwith contralateral UE reach to challenge core abdominal contractionx10 reps each LE with visual cues to increase ROM, AAROM on LLE;  Rolling from supine to right sidelying with mod  VCS for reaching with LUE and turning head and keeping LLE hip engaged for better rolling;   Patient required min A +1 for transition supine to sidelying with cues for UE/LE positioning;  Patient required modA +35for transition sidelying to sitting with cues for hand placement and trunk control; He initially tried to push through RUE but fatigued quickly and was unable to push self up into sitting without mod A +1;  Sitting edge of mat table: Able to sit unsupported with feet on floor with neutral weight shift with visual cues for correction, requiring min A-stand by assistfor safety Instructed patient in reaching various directions with each UE to challenge trunk control x2 min; Instructed patient in catching ball and tossing x10 reps with caregiver and therapist providing min A for safety and positioning/reorienting to midline; Patient sitting with feet on floor/unsupported reaching with ball passing side/side with cues to avoid posterior loss of balance x5 reps;   PT educated patient/caregiver in how to place hoyer pad under patient in sitting with instruction for patient to laterally lean while therapist pulled pad under contralateral hip x4reps each direction for proper pad placement; He did require mod A +2 for trunk control with max verbal cues for forward weight shift with lateral lean for better positioning;  Patient transferredfrom mat table to wheelchair with hoyer lift with good positioning in chair;                             PT Short Term Goals - 01/26/20 1308      PT SHORT TERM GOAL #1   Title Be independent with initial home exercise program for self-management of symptoms.    Baseline HEP to be given at second session; advice to practice unsupported sitting at home (06/19/2018); 07/08/18: Performing at home, 6/29: needs assistance but is doing exercise program regularly; 08/22/18: adherent;    Time 2    Period Weeks    Status Achieved     Target Date 06/26/18             PT Long Term Goals - 01/26/20 1308      PT LONG TERM GOAL #1   Title Patient will complete all bed mobility with min A to improve functional independence for getting in and out of bed and adjusting in bed.     Baseline max A - total A reported by family (06/19/2018); 07/08/18: still requires maxA, dtr reports improvement in rolling since starting therapy; 6/23: min A to supervision in clinic; not doing at home right; 8/5: Pt's daughter states that his rolling has improved, but still has difficulty with all other bed mobility; 10/23/18: pts daughter states that she is doing about 35% of  the work if he is in proper position, he does uses the bed rails to help roll;  using Harrel Lemon for coming to sit EOB; 10/12: pts daughter states that she is doing about 30% of the work if he is in proper position, he does uses the bed rails to help roll, but it is improving;  using Harrel Lemon for coming to sit EOB, 10/29: min A for sit to sidelying, rolling supervision, able to transition sidelying to sit with HHA; 12/21: min-mod A    Time 12    Period Weeks    Status Partially Met    Target Date 04/19/20      PT LONG TERM GOAL #2   Title Patient will complete sit <> stand transfer chair to chair with Min A and LRAD to improve functional independence for household and community mobility.     Baseline min A for STS, still requires elevated surface height to 27"    Time 12    Period Weeks    Status Partially Met    Target Date 04/19/20      PT LONG TERM GOAL #3   Title Patient will navigate power w/c with min A x 100 feet to improve mobility for household and short community distances.    Baseline required total A (06/12/2018); able to roll 20 feet with min A (06/20/2018); 07/08/18: Pt can go approximately 10' without assist per family;  10/23/2018: pt's daughter states that his power WC mobility has become 100% better with the new hand control, slowed speed, and with practice; 10/12: pt's  daughter reports that he can perform household WC mobility and limited community ambulation with minA    Time 12    Period Weeks    Status Achieved    Target Date 04/19/20      PT LONG TERM GOAL #4   Title Patient will complete car transfers W/C to family SUV with min A using LRAD to improve his abilty to participate in community activities.     Baseline unable to complete car transfers at all (06/12/2018); unable to complete car transfers at all (06/19/2018).; 07/08/18: Unable to perform at this time; 09/11/18: unable to perform at this time; 10/23/2018: unable to complete car transfers at all; 10/12:  unable to complete car transfers at all, 10/29: unable; 01/13/19: unable; 2/11: min-mod A +2, 3/10: min A+1, 12/21: mod A    Time 12    Period Weeks    Status Deferred    Target Date 04/19/20      PT LONG TERM GOAL #5   Title Patient will ambulate at least 10 feet with min A using LRAD to improve mobility for household and community distances.    Baseline unable to take steps (06/12/2018); able to shift R leg forward and back with max A and RW at edge of plinth (06/19/2018); 07/08/18: unable to ambulate at this time. 09/11/18: unable to perform at this time; 10/23/2018: unable to perform at this time; 10/12: unable to perform at this time, 10/29: unable at this time; 01/13/19: unable at this time, 2/11: min A to step with RLE, unable to step with LLE, 3/10: able to take 2 steps in parallel bars with min A for safety; 4/1: min A for walking 10 feet in parallel bars, 5/24: min A walking 10 feet in parallel bars; 6/21: no change 7/14: no change    Time 12    Period Weeks    Status Deferred    Target Date 04/19/20  PT LONG TERM GOAL #6   Title Patient will be able to safety navigate up/down ramp with power chair, exhibiting good safety awareness, mod I to safely enter/exit his home.    Baseline 2/11: supervision with intermittent min A;4/1: supervision; 5/24: supervision    Time 12    Period Weeks    Status  Partially Met    Target Date 04/19/20      PT LONG TERM GOAL #7   Title Patient will increase functional reach test to >15 inches in sitting to exhibit improved sitting balance and positioning and reduce fall risk;    Baseline 07/30/18: 12 inches; 09/11/18: 10-12 inches; 10/23/18: 12-13 inch; 10/12: 14.5", 10/29: 15.5 inch    Time 4    Period Weeks    Status Achieved    Target Date 04/19/20      PT LONG TERM GOAL #8   Title Patient will increase LLE quad strength to 3+/5 to improve functional strength for standing and mobility;    Baseline 10/28: 2+/5; 01/13/19: 2+/5, 2/11: 3-/5, 04/16/19: 3-/5, 05/08/19: 3-/5, 5/24: 3-/5, 6/21: 3-/5, 7/14: 3-/5, 12/21: 3-/5    Time 12    Period Weeks    Status Partially Met    Target Date 04/19/20      PT LONG TERM GOAL  #9   TITLE Patient will report no vertigo with provoking motions or positions in order to be able to perform ADLs and mobility tasks without symptoms.    Baseline 06/30/19: reports no dizziness in last week; 6/21: one episode this weekend; 7/14: experiencing one a week;    Time 12    Period Weeks    Status Partially Met    Target Date 04/19/20                 Plan - 02/04/20 1416    Clinical Impression Statement The patient requires mod to max cueing and min to mod assist for bed mobility, transfers and static sitting.  The patient unable to independently maintain a neutral seated posture for greater than 5-10 seconds prior to need for cueing to shift his weight forward or to use hands for support.  The patient consistently posteriorly shifts weight in seated position. Patient continues to benefit from additional skilled PT services to further improve core and postural strength and ability to perform perform bed mobility and sit without assistance.    Personal Factors and Comorbidities Age;Comorbidity 3+    Comorbidities Relevant past medical history and comorbidities include long term steroid use for lupus, CKD, chronic osteomyelitis,  cervical spine stenosis, BPH, APS, alcohol abuse, peripheral artery disease, depression, GERD, obstructive sleep apnea, HTN, IBS, IgA deficiency, lumbar radiculopathy, RA, Stroke, toxic maculopathy in both eyes, systemic lupus erythematosus related syndrome, cardiac catheterization, carotid endarterectomy, cervical laminectomy, coronary angioplasty, Fusion C5-C7, knee arthroplasty, lumbar surgery.    Examination-Activity Limitations Bed Mobility;Bend;Sit;Toileting;Stand;Stairs;Lift;Transfers;Squat;Locomotion Level;Carry;Dressing;Hygiene/Grooming;Continence    Examination-Participation Restrictions Yard Work;Interpersonal Relationship;Community Activity    Stability/Clinical Decision Making Evolving/Moderate complexity    Rehab Potential Fair    PT Frequency 3x / week    PT Duration 12 weeks    PT Treatment/Interventions ADLs/Self Care Home Management;Electrical Stimulation;Moist Heat;Cryotherapy;Gait training;Stair training;Functional mobility training;Therapeutic exercise;Balance training;Neuromuscular re-education;Cognitive remediation;Patient/family education;Orthotic Fit/Training;Wheelchair mobility training;Manual techniques;Passive range of motion;Energy conservation;Joint Manipulations;Canalith Repostioning;Therapeutic activities;Vestibular    PT Next Visit Plan Work on strength, sitting/standing balance, functional activities such as rolling, bed mobility, transfers; caregiver training    PT Home Exercise Plan no updates this session    Consulted and  Agree with Plan of Care Patient;Family member/caregiver    Family Member Consulted DTR           Patient will benefit from skilled therapeutic intervention in order to improve the following deficits and impairments:  Abnormal gait,Decreased activity tolerance,Decreased cognition,Decreased endurance,Decreased knowledge of use of DME,Decreased range of motion,Decreased skin integrity,Decreased strength,Impaired perceived functional  ability,Impaired sensation,Impaired UE functional use,Improper body mechanics,Pain,Cardiopulmonary status limiting activity,Decreased balance,Decreased coordination,Decreased mobility,Difficulty walking,Impaired tone,Postural dysfunction,Dizziness  Visit Diagnosis: Muscle weakness (generalized)  Other lack of coordination  Difficulty in walking, not elsewhere classified  Hemiplegia and hemiparesis following cerebral infarction affecting left non-dominant side ([place])  Repeated falls     Problem List Patient Active Problem List   Diagnosis Date Noted  . Sepsis ([place]) 10/18/2018    Hal Morales PT, DPT 02/04/2020, 2:25 PM  [place] MAIN [facility] SERVICES [address] [place], [place], [address] Phone: [phone]   Fax:  [phone]  Name: Kenneth Summers MRN: [id number] Date of Birth: 1945-09-12

## 2020-02-05 ENCOUNTER — Encounter: Payer: Self-pay | Admitting: Occupational Therapy

## 2020-02-05 ENCOUNTER — Ambulatory Visit: Payer: Medicare Other | Admitting: Occupational Therapy

## 2020-02-05 ENCOUNTER — Other Ambulatory Visit: Payer: Self-pay

## 2020-02-05 ENCOUNTER — Ambulatory Visit: Payer: Medicare Other

## 2020-02-05 DIAGNOSIS — M6281 Muscle weakness (generalized): Secondary | ICD-10-CM

## 2020-02-05 DIAGNOSIS — I69354 Hemiplegia and hemiparesis following cerebral infarction affecting left non-dominant side: Secondary | ICD-10-CM

## 2020-02-05 DIAGNOSIS — R262 Difficulty in walking, not elsewhere classified: Secondary | ICD-10-CM

## 2020-02-05 DIAGNOSIS — R296 Repeated falls: Secondary | ICD-10-CM

## 2020-02-05 NOTE — Therapy (Signed)
[place] MAIN [facility] SERVICES [address] [place], [place], [address] Phone: [phone]   Fax:  [phone]  Occupational Therapy Treatment  Patient Details  Name: Kenneth Summers MRN: [id number] Date of Birth: [birth year] No data recorded  Encounter Date: 02/05/2020   OT End of Session - 02/05/20 1305    Visit Number 162    Number of Visits 181    Date for OT Re-Evaluation 03/01/20    Authorization Type Progress report period starting 01/26/2020    OT Start Time 1303    OT Stop Time 1345    OT Time Calculation (min) 42 min    Activity Tolerance Patient tolerated treatment well    Behavior During Therapy [facility] for tasks assessed/performed           Past Medical History:  Diagnosis Date  . Actinic keratosis   . Alcohol abuse   . APS (antiphospholipid syndrome) ([place])   . Basal cell carcinoma   . BPH (benign prostatic hyperplasia)   . CAD (coronary artery disease)   . Cellulitis   . Cervical spinal stenosis   . Chronic osteomyelitis ([place])   . CKD (chronic kidney disease)   . CKD (chronic kidney disease)   . Clostridium difficile diarrhea   . Depression   . Diplopia   . Fatigue   . GERD (gastroesophageal reflux disease)   . Hyperlipemia   . Hypertension   . Hypovitaminosis D   . IBS (irritable bowel syndrome)   . 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 Insomnia   . Left lumbosacral radiculopathy   . Moderate obstructive sleep apnea   . Osteomyelitis of foot ([place])   . PAD (peripheral artery disease) ([place])   . Pruritus   . RA (rheumatoid arthritis) ([place])   . Radiculopathy   . Restless legs syndrome   . RLS (restless legs syndrome)   . SLE (systemic lupus erythematosus related syndrome) ([place])   . 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 Stroke ([place])   . Toxic maculopathy of both eyes     Past Surgical History:  Procedure Laterality Date  . CARDIAC CATHETERIZATION    . CAROTID ENDARTERECTOMY     . CERVICAL LAMINECTOMY    . CORONARY ANGIOPLASTY    . FRACTURE SURGERY    . fusion C5-6-7    . HERNIA REPAIR    . KNEE ARTHROSCOPY    . TONSILLECTOMY      There were no vitals filed for this visit.  OT TREATMENT   Therapeutic Exercise:  Pt. performed3#dowelfor UE strengthening secondary to weakness.Pt.tolerated increased dowel weight.Bilateral shoulder flexion, chest press,andcircular patterns were performedfor2 sets 20 reps each.Pt. worked on3# dumbbell ex. forbilateralelbow flexion and extension, 3# for right,3#. Pt. perfromedforearm supination/pronation, wrist flexion/extension 3# for the RUE, and 2# with the left for1 set of10-20and radial deviation 1 set 20 reps.  Pt. reports that he had a nice Christmas Holiday this past weekend. Pt.continuestomake progress with BUE strength, and tolerance for there. Ex.Pt.continues to present with weakness, as well asimpaired motor control, and motor planning.Pt. tolerated BUE exercises well, with no reportsofdiscomfort. Pt. required consistent verbal, visual, and tactile cues. Pt.continues torequire less cuesto perform UE strengthening exercises, technique, and formas well as visual demonstration prior to the exercises.Pt. continues to work on improving BUE strength, and [facility] skills in order to improve LUE functioning during ADLs, IADLs, and tranfers.  OT Education - 02/05/20 1305    Education Details BUE ther. ex    Person(s) Educated Patient    Methods Explanation;Demonstration    Comprehension Returned demonstration;Tactile cues required;Verbalized understanding;Need further instruction               OT Long Term Goals - 01/22/20 1329      OT LONG TERM GOAL #1   Title Pt. will increase left shoulder flexion AROM by 10 degrees to access cabinet/shelf.    Baseline Pt. continues to progress with left shoulder ROM .Shouler flexion 130, and continues to work  on reaching with increasing emphasis on flexing trunk to improve functional reach.  07/21/19- shoulder flexion to 130 with effort    Time 12    Period Weeks    Status Not Met    Target Date 03/01/20      OT LONG TERM GOAL #2   Title Pt. will donn a shirt with Supervision.    Baseline Pt. continues to require modA donning a zip down jacket with consistent cues to initiate the correct direction of the jacket.  Min-modA mahaging the zipper. MinA for donning a T-shirt with assist to roll side to side to pull the shirt down over his back. 07/21/19 continues to require min assist at times for shirt/jacket    Time 12    Period Weeks    Status On-going    Target Date 03/01/20      OT LONG TERM GOAL #4   Title Pt. will improve left grip strength by 10# to be able to open a jar/container.    Baseline Pt. is making stedy progress with grip strength. Pt. is having a herder time opening a wide mouthed jar, however is able to open a smaller jar easier.    Time 12    Period Weeks    Status On-going    Target Date 03/02/19      OT LONG TERM GOAL #5   Title Pt. will improve left hand [facility] skills to be able to assist with buttoning/zipping.    Baseline Pt. has improved overall donning a jacket, and  manipulating the zipper on his jacket, however continues to require practice depending on the type of zipper jacket. Pt. continues to work on improving bilateral [facility] skills for buttoning/zipping.  07/21/19 improving with buttons and zippers.    Time 12    Period Weeks    Status On-going    Target Date 03/01/20      OT LONG TERM GOAL #6   Title Pt. will demonstrate visual conmpensatory strategies for 100% of the time during ADLs    Baseline Pt.continues to require intermittent cues cues for left sides awareness.    Time 12    Period Weeks    Status Partially Met    Target Date 03/01/20      OT LONG TERM GOAL #8   Title Pt. will accurately identify potential safety hazard using good safety awareness, and  judgement 100% for ADLs, and IADLs.    Baseline Pt. continues to require cues    Time 12    Period Weeks    Status On-going    Target Date 03/01/20      OT LONG TERM GOAL  #9   TITLE Pt. will  navigate his w/c around obstacles with Supervision and 100% accuracy    Baseline Pt. continues to require cuing for obstacles on the left, however requires less cues overall. Pt. requires significant cues to engage his  left UE during tasks. 07/21/2019 continues to improve with decreased assist at times    Time 12    Period Weeks    Status On-going    Target Date 03/01/20      OT LONG TERM GOAL  #10   TITLE Pt. will independently be able  to reach to place items into cabinetry, and closets.    Baseline Pt. continues to have difficulty with functional reaching conitnues to be limited.    Time 12    Period Weeks    Status On-going    Target Date 03/01/20      OT LONG TERM GOAL  #11   TITLE Pt. will increase BUE strength by 79mm grades in preparation for ADL transfers.    Baseline Pt. BUE strength conitnues to be limited. Pt. is able to tolerate increased resistive weights. Pt. continues to work towards improving overall strength for ADLs.    Time 12    Period Weeks    Status On-going    Target Date 03/01/20      OT LONG TERM GOAL  #12   TITLE Pt. will improve LUE functional reaching with the left engaging the trunk 100% of the time during ADL tasks with minimal cues.    Baseline Pt. continues to improve with LUE functional reaching. Fewer cues are required to engage his trunk for extended reaching.    Time 12    Period Weeks    Status On-going    Target Date 03/01/20                 Plan - 02/05/20 1306    Clinical Impression Statement Pt. reports that he had a nice Christmas Holiday this past weekend. Pt.continuestomake progress with BUE strength, and tolerance for there. Ex.Pt.continues to present with weakness, as well asimpaired motor control, and motor planning.Pt.  tolerated BUE exercises well, with no reportsofdiscomfort. Pt. required consistent verbal, visual, and tactile cues. Pt.continues torequire less cuesto perform UE strengthening exercises, technique, and formas well as visual demonstration prior to the exercises.Pt. continues to work on improving BUE strength, and [facility] skills in order to improve LUE functioning during ADLs, IADLs, and tranfers.    OT Occupational Profile and History Problem Focused Assessment - Including review of records relating to presenting problem    Occupational performance deficits (Please refer to evaluation for details): ADL's    Body Structure / Function / Physical Skills UE functional use;Coordination;FMC;Dexterity;Strength;ROM    Cognitive Skills Attention;Memory;Emotional;Problem Solve;Safety Awareness    Rehab Potential Good    Clinical Decision Making Several treatment options, min-mod task modification necessary    Comorbidities Affecting Occupational Performance: Presence of comorbidities impacting occupational performance    Comorbidities impacting occupational performance description: Phyical, cognitive, visual,  medical comorbidities    Modification or Assistance to Complete Evaluation  No modification of tasks or assist necessary to complete eval    OT Frequency 2x / week    OT Duration 12 weeks    OT Treatment/Interventions Self-care/ADL training;DME and/or AE instruction;Therapeutic exercise;Therapeutic activities;Moist Heat;Cognitive remediation/compensation;Neuromuscular education;Visual/perceptual remediation/compensation;Coping strategies training;Patient/family education;Passive range of motion;Psychosocial skills training;Energy conservation;Functional Mobility Training    Consulted and Agree with Plan of Care Patient;Family member/caregiver    Family Member Consulted daughter [name]           Patient will benefit from skilled therapeutic intervention in order to improve the following deficits and  impairments:   Body Structure / Function / Physical Skills: UE functional use,Coordination,FMC,Dexterity,Strength,ROM Cognitive Skills: Attention,Memory,Emotional,Problem Solve,Safety Awareness  Visit Diagnosis: Muscle weakness (generalized)    Problem List Patient Active Problem List   Diagnosis Date Noted  . Sepsis ([place]) 10/18/2018    Harrel Carina, MS, OTR/L 02/05/2020, 1:09 PM  [place] MAIN [facility] SERVICES [address] [place], [place], [address] Phone: [phone]   Fax:  [phone]  Name: MILANO ROSEVEAR MRN: [id number] Date of Birth: [birth year]

## 2020-02-05 NOTE — Therapy (Signed)
[place] MAIN [facility] SERVICES [address] [place], [place], [address] Phone: [phone]   Fax:  [phone]  Physical Therapy Treatment  Patient Details  Name: Kenneth Summers MRN: [id number] Date of Birth: [birth year] No data recorded  Encounter Date: 02/05/2020    Past Medical History:  Diagnosis Date  . Actinic keratosis   . Alcohol abuse   . APS (antiphospholipid syndrome) ([place])   . Basal cell carcinoma   . BPH (benign prostatic hyperplasia)   . CAD (coronary artery disease)   . Cellulitis   . Cervical spinal stenosis   . Chronic osteomyelitis ([place])   . CKD (chronic kidney disease)   . CKD (chronic kidney disease)   . Clostridium difficile diarrhea   . Depression   . Diplopia   . Fatigue   . GERD (gastroesophageal reflux disease)   . Hyperlipemia   . Hypertension   . Hypovitaminosis D   . IBS (irritable bowel syndrome)   . 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 Insomnia   . Left lumbosacral radiculopathy   . Moderate obstructive sleep apnea   . Osteomyelitis of foot ([place])   . PAD (peripheral artery disease) ([place])   . Pruritus   . RA (rheumatoid arthritis) ([place])   . Radiculopathy   . Restless legs syndrome   . RLS (restless legs syndrome)   . SLE (systemic lupus erythematosus related syndrome) ([place])   . 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 Stroke ([place])   . Toxic maculopathy of both eyes     Past Surgical History:  Procedure Laterality Date  . CARDIAC CATHETERIZATION    . CAROTID ENDARTERECTOMY    . CERVICAL LAMINECTOMY    . CORONARY ANGIOPLASTY    . FRACTURE SURGERY    . fusion C5-6-7    . HERNIA REPAIR    . KNEE ARTHROSCOPY    . TONSILLECTOMY      There were no vitals filed for this visit.    TREATMENT:  Patient transferred to mat table from wheelchair with hoyer lift. Utilized patient's personal lift pad for ease of transfer;     Patient hooklying:    Rolling side to side  x4 reps each direction with min A and min-mod VCS for LE/UE positioning for better rolling; mod cueing for left leg positioning   Hooklying: Hip flexion march with contralateral UE reach to challenge core abdominal contraction x10 reps each LE with visual cues to increase ROM, AAROM on LLE;    Rolling from supine to right sidelying with mod VCS for reaching with LUE and turning head and keeping LLE hip engaged for better rolling;    Patient required min A +1 for transition supine to sidelying with cues for UE/LE positioning;    Patient required modA +1 for transition sidelying to sitting with cues for hand placement and trunk control;  He initially tried to push through RUE but fatigued quickly and was unable to push self up into sitting without mod A +1;    Sitting edge of mat table: Able to sit unsupported with feet on floor with neutral weight shift with visual cues for correction, requiring min A-stand by assist for safety Instructed patient in reaching various directions with each UE to challenge trunk control x2 min;  Instructed patient in catching ball and tossing x10 reps with caregiver and therapist providing min A for safety and positioning/reorienting to midline; Patient sitting with feet on floor/unsupported  reaching with ball passing side/side with cues to avoid posterior loss of balance x5 reps;     PT educated patient/caregiver in how to place hoyer pad under patient in sitting with instruction for patient to laterally lean while therapist pulled pad under contralateral hip x4 reps each direction for proper pad placement; He did require mod A +2 for trunk control with max verbal cues for forward weight shift with lateral lean for better positioning;    Patient transferred from mat table to wheelchair with hoyer lift with good positioning in chair;                                PT Short Term Goals - 01/26/20 1308      PT SHORT TERM GOAL #1   Title Be  independent with initial home exercise program for self-management of symptoms.    Baseline HEP to be given at second session; advice to practice unsupported sitting at home (06/19/2018); 07/08/18: Performing at home, 6/29: needs assistance but is doing exercise program regularly; 08/22/18: adherent;    Time 2    Period Weeks    Status Achieved    Target Date 06/26/18             PT Long Term Goals - 01/26/20 1308      PT LONG TERM GOAL #1   Title Patient will complete all bed mobility with min A to improve functional independence for getting in and out of bed and adjusting in bed.     Baseline max A - total A reported by family (06/19/2018); 07/08/18: still requires maxA, dtr reports improvement in rolling since starting therapy; 6/23: min A to supervision in clinic; not doing at home right; 8/5: Pt's daughter states that his rolling has improved, but still has difficulty with all other bed mobility; 10/23/18: pts daughter states that she is doing about 35% of the work if he is in proper position, he does uses the bed rails to help roll;  using Hoyer for coming to sit EOB; 10/12: pts daughter states that she is doing about 30% of the work if he is in proper position, he does uses the bed rails to help roll, but it is improving;  using Hoyer for coming to sit EOB, 10/29: min A for sit to sidelying, rolling supervision, able to transition sidelying to sit with HHA; 12/21: min-mod A    Time 12    Period Weeks    Status Partially Met    Target Date 04/19/20      PT LONG TERM GOAL #2   Title Patient will complete sit <> stand transfer chair to chair with Min A and LRAD to improve functional independence for household and community mobility.     Baseline min A for STS, still requires elevated surface height to 27"    Time 12    Period Weeks    Status Partially Met    Target Date 04/19/20      PT LONG TERM GOAL #3   Title Patient will navigate power w/c with min A x 100 feet to improve mobility for  household and short community distances.    Baseline required total A (06/12/2018); able to roll 20 feet with min A (06/20/2018); 07/08/18: Pt can go approximately 10' without assist per family;  10/23/2018: pt's daughter states that his power WC mobility has become 100% better with the new hand control, slowed speed,  and with practice; 10/12: pt's daughter reports that he can perform household WC mobility and limited community ambulation with minA    Time 12    Period Weeks    Status Achieved    Target Date 04/19/20      PT LONG TERM GOAL #4   Title Patient will complete car transfers W/C to family SUV with min A using LRAD to improve his abilty to participate in community activities.     Baseline unable to complete car transfers at all (06/12/2018); unable to complete car transfers at all (06/19/2018).; 07/08/18: Unable to perform at this time; 09/11/18: unable to perform at this time; 10/23/2018: unable to complete car transfers at all; 10/12:  unable to complete car transfers at all, 10/29: unable; 01/13/19: unable; 2/11: min-mod A +2, 3/10: min A+1, 12/21: mod A    Time 12    Period Weeks    Status Deferred    Target Date 04/19/20      PT LONG TERM GOAL #5   Title Patient will ambulate at least 10 feet with min A using LRAD to improve mobility for household and community distances.    Baseline unable to take steps (06/12/2018); able to shift R leg forward and back with max A and RW at edge of plinth (06/19/2018); 07/08/18: unable to ambulate at this time. 09/11/18: unable to perform at this time; 10/23/2018: unable to perform at this time; 10/12: unable to perform at this time, 10/29: unable at this time; 01/13/19: unable at this time, 2/11: min A to step with RLE, unable to step with LLE, 3/10: able to take 2 steps in parallel bars with min A for safety; 4/1: min A for walking 10 feet in parallel bars, 5/24: min A walking 10 feet in parallel bars; 6/21: no change 7/14: no change    Time 12    Period Weeks     Status Deferred    Target Date 04/19/20      PT LONG TERM GOAL #6   Title Patient will be able to safety navigate up/down ramp with power chair, exhibiting good safety awareness, mod I to safely enter/exit his home.    Baseline 2/11: supervision with intermittent min A;4/1: supervision; 5/24: supervision    Time 12    Period Weeks    Status Partially Met    Target Date 04/19/20      PT LONG TERM GOAL #7   Title Patient will increase functional reach test to >15 inches in sitting to exhibit improved sitting balance and positioning and reduce fall risk;    Baseline 07/30/18: 12 inches; 09/11/18: 10-12 inches; 10/23/18: 12-13 inch; 10/12: 14.5", 10/29: 15.5 inch    Time 4    Period Weeks    Status Achieved    Target Date 04/19/20      PT LONG TERM GOAL #8   Title Patient will increase LLE quad strength to 3+/5 to improve functional strength for standing and mobility;    Baseline 10/28: 2+/5; 01/13/19: 2+/5, 2/11: 3-/5, 04/16/19: 3-/5, 05/08/19: 3-/5, 5/24: 3-/5, 6/21: 3-/5, 7/14: 3-/5, 12/21: 3-/5    Time 12    Period Weeks    Status Partially Met    Target Date 04/19/20      PT LONG TERM GOAL  #9   TITLE Patient will report no vertigo with provoking motions or positions in order to be able to perform ADLs and mobility tasks without symptoms.    Baseline 06/30/19: reports no dizziness in last  week; 6/21: one episode this weekend; 7/14: experiencing one a week;    Time 12    Period Weeks    Status Partially Met    Target Date 04/19/20                  Patient will benefit from skilled therapeutic intervention in order to improve the following deficits and impairments:     Visit Diagnosis: No diagnosis found.     Problem List Patient Active Problem List   Diagnosis Date Noted  . Sepsis ([place]) 10/18/2018    Hal Morales PT, DPT 02/05/2020, 1:45 PM  [place] MAIN [facility] SERVICES [address] [place], [place], [address] Phone:  [phone]   Fax:  [phone]  Name: BETTY BROOKS MRN: [id number] Date of Birth: May 30, 1945

## 2020-02-09 ENCOUNTER — Ambulatory Visit: Payer: Medicare Other | Admitting: Occupational Therapy

## 2020-02-09 ENCOUNTER — Ambulatory Visit: Payer: Medicare Other | Admitting: Physical Therapy

## 2020-02-11 ENCOUNTER — Ambulatory Visit: Payer: Medicare Other | Admitting: Occupational Therapy

## 2020-02-11 ENCOUNTER — Other Ambulatory Visit: Payer: Self-pay

## 2020-02-11 ENCOUNTER — Ambulatory Visit: Payer: Medicare Other | Attending: Family Medicine

## 2020-02-11 DIAGNOSIS — R278 Other lack of coordination: Secondary | ICD-10-CM | POA: Insufficient documentation

## 2020-02-11 DIAGNOSIS — R262 Difficulty in walking, not elsewhere classified: Secondary | ICD-10-CM | POA: Diagnosis present

## 2020-02-11 DIAGNOSIS — I69354 Hemiplegia and hemiparesis following cerebral infarction affecting left non-dominant side: Secondary | ICD-10-CM

## 2020-02-11 DIAGNOSIS — H547 Unspecified visual loss: Secondary | ICD-10-CM | POA: Insufficient documentation

## 2020-02-11 DIAGNOSIS — R296 Repeated falls: Secondary | ICD-10-CM | POA: Diagnosis present

## 2020-02-11 DIAGNOSIS — R41841 Cognitive communication deficit: Secondary | ICD-10-CM | POA: Insufficient documentation

## 2020-02-11 DIAGNOSIS — M6281 Muscle weakness (generalized): Secondary | ICD-10-CM | POA: Diagnosis not present

## 2020-02-11 NOTE — Therapy (Signed)
[place] [facility] SERVICES [address] [place], [place], [address] Phone: [phone]   Fax:  [phone]  Physical Therapy Treatment  Patient Details  Name: Kenneth Summers MRN: [id number] Date of Birth: 1945-05-28 No data recorded  Encounter Date: 02/11/2020   PT End of Session - 02/11/20 1555    Visit Number [id number]    Number of Visits 252    Date for PT Re-Evaluation 04/19/20    Authorization Type Medicare reporting period starting 09/11/18; NO FOTO    Authorization Time Period goals last assessed 08/20/19    PT Start Time 0100    PT Stop Time 0145    PT Time Calculation (min) 45 min    Equipment Utilized During Treatment Gait belt    Activity Tolerance Patient tolerated treatment well;No increased pain    Behavior During Therapy WFL for tasks assessed/performed           Past Medical History:  Diagnosis Date  . Actinic keratosis   . Alcohol abuse   . APS (antiphospholipid syndrome) ([place])   . Basal cell carcinoma   . BPH (benign prostatic hyperplasia)   . CAD (coronary artery disease)   . Cellulitis   . Cervical spinal stenosis   . Chronic osteomyelitis ([place])   . CKD (chronic kidney disease)   . CKD (chronic kidney disease)   . Clostridium difficile diarrhea   . Depression   . Diplopia   . Fatigue   . GERD (gastroesophageal reflux disease)   . Hyperlipemia   . Hypertension   . Hypovitaminosis D   . IBS (irritable bowel syndrome)   . 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 Insomnia   . Left lumbosacral radiculopathy   . Moderate obstructive sleep apnea   . Osteomyelitis of foot ([place])   . PAD (peripheral artery disease) ([place])   . Pruritus   . RA (rheumatoid arthritis) ([place])   . Radiculopathy   . Restless legs syndrome   . RLS (restless legs syndrome)   . SLE (systemic lupus erythematosus related syndrome) ([place])   . 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 Stroke ([place])   . Toxic maculopathy of  both eyes     Past Surgical History:  Procedure Laterality Date  . CARDIAC CATHETERIZATION    . CAROTID ENDARTERECTOMY    . CERVICAL LAMINECTOMY    . CORONARY ANGIOPLASTY    . FRACTURE SURGERY    . fusion C5-6-7    . HERNIA REPAIR    . KNEE ARTHROSCOPY    . TONSILLECTOMY      There were no vitals filed for this visit.   Subjective Assessment - 02/11/20 1553    Subjective [name] caregiver states patient did not have a good night last night and did not sleep well.    Pertinent History Patient is a 75 y.o. male who presents to outpatient physical therapy with a referral for medical diagnosis of CVA. This patient's chief complaints consist of left hemiplegia and overall deconditioning leading to the following functional deficits: dependent for ADLs, IADLs, unable to transfer to car, dependent transfers at home with hoyer lift, unable to walk or stand without significant assistance, difficulty with W/C navigation..    Limitations Sitting;Lifting;Standing;House hold activities;Writing;Walking;Other (comment)    Diagnostic tests Brain MRI 11/01/2017: IMPRESSION: 1. Acute right ACA territory infarct as demonstrated on prior CT imaging. No intracranial hemorrhage or significant mass effect. 2. 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    Patient Stated Goals wants to be able to walk again    Currently in Pain? No/denies    Pain Onset Today          TREATMENT:  Patient transferred to mat table from wheelchair with hoyer lift. Utilized patient's personal lift pad for ease of transfer;     Patient hooklying:    Rolling from supine to right sidelying with mod VCS for reaching with LUE and turning head and keeping LLE hip engaged for better rolling; Patient able to independently roll to left side on 3rd rep Continues to be challenged going to right side  Ball squeeze, tactile cueing to activate left LE  SAQ:  Assist needed to left LE improved with  repetitions   Patient required modA +1 for transition sidelying to sitting with cues for hand placement and trunk control;  Able to position hands properly to initiate the transition.  Patient then required mod cueing to continue push off with mod A x1 to sit fully up.     Sitting edge of mat table: Able to sit unsupported with feet on floor with neutral weight shift with visual cues for correction, requiring min A-stand by assist for safety Instructed patient in reaching various directions with each UE to challenge trunk control x2 min;  Instructed patient in catching ball and tossing x10 reps with caregiver and therapist providing min A for safety and positioning/reorienting to midline; Patient sitting with feet on floor/unsupported reaching with ball passing side/side with cues to avoid posterior loss of balance x5 reps;                           PT Education - 02/11/20 1554    Education Details sitting posture    Person(s) Educated Theatre stage manager);Patient    Methods Explanation;Demonstration;Verbal cues;Tactile cues    Comprehension Verbalized understanding;Returned demonstration;Verbal cues required;Tactile cues required;Need further instruction            PT Short Term Goals - 01/26/20 1308      PT SHORT TERM GOAL #1   Title Be independent with initial home exercise program for self-management of symptoms.    Baseline HEP to be given at second session; advice to practice unsupported sitting at home (06/19/2018); 07/08/18: Performing at home, 6/29: needs assistance but is doing exercise program regularly; 08/22/18: adherent;    Time 2    Period Weeks    Status Achieved    Target Date 06/26/18             PT Long Term Goals - 01/26/20 1308      PT LONG TERM GOAL #1   Title Patient will complete all bed mobility with min A to improve functional independence for getting in and out of bed and adjusting in bed.     Baseline max A - total A reported by family  (06/19/2018); 07/08/18: still requires maxA, dtr reports improvement in rolling since starting therapy; 6/23: min A to supervision in clinic; not doing at home right; 8/5: Pt's daughter states that his rolling has improved, but still has difficulty with all other bed mobility; 10/23/18: pts daughter states that she is doing about 35% of the work if he is in proper position, he does uses the bed rails to help roll;  using Harrel Lemon for coming to sit EOB; 10/12: pts daughter states that she is doing about 30% of the work if he is  in proper position, he does uses the bed rails to help roll, but it is improving;  using Harrel Lemon for coming to sit EOB, 10/29: min A for sit to sidelying, rolling supervision, able to transition sidelying to sit with HHA; 12/21: min-mod A    Time 12    Period Weeks    Status Partially Met    Target Date 04/19/20      PT LONG TERM GOAL #2   Title Patient will complete sit <> stand transfer chair to chair with Min A and LRAD to improve functional independence for household and community mobility.     Baseline min A for STS, still requires elevated surface height to 27"    Time 12    Period Weeks    Status Partially Met    Target Date 04/19/20      PT LONG TERM GOAL #3   Title Patient will navigate power w/c with min A x 100 feet to improve mobility for household and short community distances.    Baseline required total A (06/12/2018); able to roll 20 feet with min A (06/20/2018); 07/08/18: Pt can go approximately 10' without assist per family;  10/23/2018: pt's daughter states that his power WC mobility has become 100% better with the new hand control, slowed speed, and with practice; 10/12: pt's daughter reports that he can perform household WC mobility and limited community ambulation with minA    Time 12    Period Weeks    Status Achieved    Target Date 04/19/20      PT LONG TERM GOAL #4   Title Patient will complete car transfers W/C to family SUV with min A using LRAD to improve his  abilty to participate in community activities.     Baseline unable to complete car transfers at all (06/12/2018); unable to complete car transfers at all (06/19/2018).; 07/08/18: Unable to perform at this time; 09/11/18: unable to perform at this time; 10/23/2018: unable to complete car transfers at all; 10/12:  unable to complete car transfers at all, 10/29: unable; 01/13/19: unable; 2/11: min-mod A +2, 3/10: min A+1, 12/21: mod A    Time 12    Period Weeks    Status Deferred    Target Date 04/19/20      PT LONG TERM GOAL #5   Title Patient will ambulate at least 10 feet with min A using LRAD to improve mobility for household and community distances.    Baseline unable to take steps (06/12/2018); able to shift R leg forward and back with max A and RW at edge of plinth (06/19/2018); 07/08/18: unable to ambulate at this time. 09/11/18: unable to perform at this time; 10/23/2018: unable to perform at this time; 10/12: unable to perform at this time, 10/29: unable at this time; 01/13/19: unable at this time, 2/11: min A to step with RLE, unable to step with LLE, 3/10: able to take 2 steps in parallel bars with min A for safety; 4/1: min A for walking 10 feet in parallel bars, 5/24: min A walking 10 feet in parallel bars; 6/21: no change 7/14: no change    Time 12    Period Weeks    Status Deferred    Target Date 04/19/20      PT LONG TERM GOAL #6   Title Patient will be able to safety navigate up/down ramp with power chair, exhibiting good safety awareness, mod I to safely enter/exit his home.    Baseline 2/11: supervision with  intermittent min A;4/1: supervision; 5/24: supervision    Time 12    Period Weeks    Status Partially Met    Target Date 04/19/20      PT LONG TERM GOAL #7   Title Patient will increase functional reach test to >15 inches in sitting to exhibit improved sitting balance and positioning and reduce fall risk;    Baseline 07/30/18: 12 inches; 09/11/18: 10-12 inches; 10/23/18: 12-13 inch; 10/12:  14.5", 10/29: 15.5 inch    Time 4    Period Weeks    Status Achieved    Target Date 04/19/20      PT LONG TERM GOAL #8   Title Patient will increase LLE quad strength to 3+/5 to improve functional strength for standing and mobility;    Baseline 10/28: 2+/5; 01/13/19: 2+/5, 2/11: 3-/5, 04/16/19: 3-/5, 05/08/19: 3-/5, 5/24: 3-/5, 6/21: 3-/5, 7/14: 3-/5, 12/21: 3-/5    Time 12    Period Weeks    Status Partially Met    Target Date 04/19/20      PT LONG TERM GOAL  #9   TITLE Patient will report no vertigo with provoking motions or positions in order to be able to perform ADLs and mobility tasks without symptoms.    Baseline 06/30/19: reports no dizziness in last week; 6/21: one episode this weekend; 7/14: experiencing one a week;    Time 12    Period Weeks    Status Partially Met    Target Date 04/19/20                 Plan - 02/11/20 1555    Clinical Impression Statement Patient able to perform supine to left sidelying independently after 2 reps of performing activity to that side.  The patient continues to require max verbal and tactile cueing for supine to left sidelying.   The patient does demonstrate some improvement in seated posture with less trunk lean laterally.  The patient however continues with heavy posterior lean requiring max verbal and tactile cueing to maintain uright position.  Patient making some gains over last couple of weeks and continues to require skilled PT services to improve postural strength and ability to transfer for improved quality of life.    Personal Factors and Comorbidities Age;Comorbidity 3+    Comorbidities Relevant past medical history and comorbidities include long term steroid use for lupus, CKD, chronic osteomyelitis, cervical spine stenosis, BPH, APS, alcohol abuse, peripheral artery disease, depression, GERD, obstructive sleep apnea, HTN, IBS, IgA deficiency, lumbar radiculopathy, RA, Stroke, toxic maculopathy in both eyes, systemic lupus  erythematosus related syndrome, cardiac catheterization, carotid endarterectomy, cervical laminectomy, coronary angioplasty, Fusion C5-C7, knee arthroplasty, lumbar surgery.    Examination-Activity Limitations Bed Mobility;Bend;Sit;Toileting;Stand;Stairs;Lift;Transfers;Squat;Locomotion Level;Carry;Dressing;Hygiene/Grooming;Continence    Examination-Participation Restrictions Yard Work;Interpersonal Relationship;Community Activity    Stability/Clinical Decision Making Evolving/Moderate complexity    Rehab Potential Fair    PT Frequency 3x / week    PT Duration 12 weeks    PT Treatment/Interventions ADLs/Self Care Home Management;Electrical Stimulation;Moist Heat;Cryotherapy;Gait training;Stair training;Functional mobility training;Therapeutic exercise;Balance training;Neuromuscular re-education;Cognitive remediation;Patient/family education;Orthotic Fit/Training;Wheelchair mobility training;Manual techniques;Passive range of motion;Energy conservation;Joint Manipulations;Canalith Repostioning;Therapeutic activities;Vestibular    PT Next Visit Plan Work on strength, sitting/standing balance, functional activities such as rolling, bed mobility, transfers; caregiver training    PT Home Exercise Plan no updates this session    Consulted and Agree with Plan of Care Patient;Family member/caregiver    Family Member Consulted DTR           Patient will benefit  from skilled therapeutic intervention in order to improve the following deficits and impairments:  Abnormal gait,Decreased activity tolerance,Decreased cognition,Decreased endurance,Decreased knowledge of use of DME,Decreased range of motion,Decreased skin integrity,Decreased strength,Impaired perceived functional ability,Impaired sensation,Impaired UE functional use,Improper body mechanics,Pain,Cardiopulmonary status limiting activity,Decreased balance,Decreased coordination,Decreased mobility,Difficulty walking,Impaired tone,Postural  dysfunction,Dizziness  Visit Diagnosis: Muscle weakness (generalized)  Difficulty in walking, not elsewhere classified  Hemiplegia and hemiparesis following cerebral infarction affecting left non-dominant side ([place])  Repeated falls     Problem List Patient Active Problem List   Diagnosis Date Noted  . Sepsis ([place]) 10/18/2018    Hal Morales PT, DPT 02/11/2020, 4:56 PM  [place] [facility] SERVICES [address] [place], [place], [address] Phone: [phone]   Fax:  [phone]  Name: Kenneth Summers MRN: [id number] Date of Birth: [birth year]

## 2020-02-12 ENCOUNTER — Ambulatory Visit: Payer: Medicare Other | Admitting: Occupational Therapy

## 2020-02-12 ENCOUNTER — Encounter: Payer: Self-pay | Admitting: Occupational Therapy

## 2020-02-12 ENCOUNTER — Other Ambulatory Visit: Payer: Self-pay

## 2020-02-12 ENCOUNTER — Ambulatory Visit: Payer: Medicare Other

## 2020-02-12 DIAGNOSIS — R278 Other lack of coordination: Secondary | ICD-10-CM

## 2020-02-12 DIAGNOSIS — R262 Difficulty in walking, not elsewhere classified: Secondary | ICD-10-CM

## 2020-02-12 DIAGNOSIS — M6281 Muscle weakness (generalized): Secondary | ICD-10-CM

## 2020-02-12 DIAGNOSIS — I69354 Hemiplegia and hemiparesis following cerebral infarction affecting left non-dominant side: Secondary | ICD-10-CM

## 2020-02-12 DIAGNOSIS — R296 Repeated falls: Secondary | ICD-10-CM

## 2020-02-12 NOTE — Therapy (Signed)
[place] MAIN [facility] SERVICES [address] [place], [place], [address] Phone: [phone]   Fax:  [phone]  Occupational Therapy Treatment  Patient Details  Name: Kenneth Summers MRN: [id number] Date of Birth: 01-09-1946 No data recorded  Encounter Date: 02/12/2020   OT End of Session - 02/12/20 1723    Visit Number [id number]    Number of Visits 181    Date for OT Re-Evaluation 03/01/20    Authorization Type Progress report period starting 01/26/2020    OT Start Time 1302    OT Stop Time 1345    OT Time Calculation (min) 43 min    Activity Tolerance Patient tolerated treatment well    Behavior During Therapy [facility] for tasks assessed/performed           Past Medical History:  Diagnosis Date  . Actinic keratosis   . Alcohol abuse   . APS (antiphospholipid syndrome) ([place])   . Basal cell carcinoma   . BPH (benign prostatic hyperplasia)   . CAD (coronary artery disease)   . Cellulitis   . Cervical spinal stenosis   . Chronic osteomyelitis ([place])   . CKD (chronic kidney disease)   . CKD (chronic kidney disease)   . Clostridium difficile diarrhea   . Depression   . Diplopia   . Fatigue   . GERD (gastroesophageal reflux disease)   . Hyperlipemia   . Hypertension   . Hypovitaminosis D   . IBS (irritable bowel syndrome)   . 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 Insomnia   . Left lumbosacral radiculopathy   . Moderate obstructive sleep apnea   . Osteomyelitis of foot ([place])   . PAD (peripheral artery disease) ([place])   . Pruritus   . RA (rheumatoid arthritis) ([place])   . Radiculopathy   . Restless legs syndrome   . RLS (restless legs syndrome)   . SLE (systemic lupus erythematosus related syndrome) ([place])   . 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 Stroke ([place])   . Toxic maculopathy of both eyes     Past Surgical History:  Procedure Laterality Date  . CARDIAC CATHETERIZATION    . CAROTID ENDARTERECTOMY    .  CERVICAL LAMINECTOMY    . CORONARY ANGIOPLASTY    . FRACTURE SURGERY    . fusion C5-6-7    . HERNIA REPAIR    . KNEE ARTHROSCOPY    . TONSILLECTOMY      There were no vitals filed for this visit.   Subjective Assessment - 02/12/20 1722    Subjective  Pt. reports that his left hand is sore today    Patient is accompanied by: Family member    Pertinent History Pt. is a 75 y.o. 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 Pt. PMHx includes: Multiple Falls, Lupus, DJD, Renal Abscess, CVA. Pt. resides with his wife. Pt.'s wife and daughter assist with ADLs. Pt. has caregivers in  for 2 hours a day, 6 days a week. Pt. received Rehab services in acute care, at SNF for STR, and [place] services. Pt. is retired from [employer] for [employer] and [employer].    Currently in Pain? No/denies          OT Treatment  Self-care:  Pt. worked on UE dressing skills donning his  jacket. Pt. required mod assist to donn his jacket, with verbal cues and assist to initiate threadinghis LUE.  Pt. required increased time, andstep by stepcues forinstruction throughout the task.Pt. Continues to Initially place the hooded sweatshirt on backwards, and requires consisitent cues for the right direction.Pt. continues to require min-modA to manage his zipper, requiring cues to engage his left hand in stabilizing the jacket.   There. Ex.  Pt. performed 3# dowel ex. For UE strengthening secondary to weakness. Bilateral shoulder flexion, chest press, circular patterns, and elbow flexion/extension were performed for 2 sets 10-20 reps each. Pt. required cues to keep the bar straight. Pt. perfromed 2# dumbbell ex. for elbow flexion and extension, forearm supination/pronation, wrist flexion/extension, and radial deviation. Pt. requires rest breaks and verbal cues for proper technique.  Pt. requires verbal cues, and tactile cues initially to orient himself to the jacket in  preparation for donning the jacket in the corect direction, and sequence.  Pt. continues torequire increased cues for problem-solving through each step of the task. Pt. continues to benefit from additional work on improving UE dressing skills. Pt. continues to work on improving UE strength, [facility] skills, and visual perceptual skills in order to improve, and maximize independence with ADLs, and IADL tasks.                            OT Education - 02/12/20 1723    Education Details BUE ther. ex    Person(s) Educated Patient    Methods Explanation;Demonstration    Comprehension Returned demonstration;Tactile cues required;Verbalized understanding;Need further instruction               OT Long Term Goals - 01/22/20 1329      OT LONG TERM GOAL #1   Title Pt. will increase left shoulder flexion AROM by 10 degrees to access cabinet/shelf.    Baseline Pt. continues to progress with left shoulder ROM .Shouler flexion 130, and continues to work on reaching with increasing emphasis on flexing trunk to improve functional reach.  07/21/19- shoulder flexion to 130 with effort    Time 12    Period Weeks    Status Not Met    Target Date 03/01/20      OT LONG TERM GOAL #2   Title Pt. will donn a shirt with Supervision.    Baseline Pt. continues to require modA donning a zip down jacket with consistent cues to initiate the correct direction of the jacket.  Min-modA mahaging the zipper. MinA for donning a T-shirt with assist to roll side to side to pull the shirt down over his back. 07/21/19 continues to require min assist at times for shirt/jacket    Time 12    Period Weeks    Status On-going    Target Date 03/01/20      OT LONG TERM GOAL #4   Title Pt. will improve left grip strength by 10# to be able to open a jar/container.    Baseline Pt. is making stedy progress with grip strength. Pt. is having a herder time opening a wide mouthed jar, however is able to open a  smaller jar easier.    Time 12    Period Weeks    Status On-going    Target Date 03/02/19      OT LONG TERM GOAL #5   Title Pt. will improve left hand [facility] skills to be able to assist with buttoning/zipping.    Baseline Pt. has improved overall donning a jacket, and  manipulating the zipper on his jacket, however continues to require practice  depending on the type of zipper jacket. Pt. continues to work on improving bilateral [facility] skills for buttoning/zipping.  07/21/19 improving with buttons and zippers.    Time 12    Period Weeks    Status On-going    Target Date 03/01/20      OT LONG TERM GOAL #6   Title Pt. will demonstrate visual conmpensatory strategies for 100% of the time during ADLs    Baseline Kenneth Summerscontinues to require intermittent cues cues for left sides awareness.    Time 12    Period Weeks    Status Partially Met    Target Date 03/01/20      OT LONG TERM GOAL #8   Title Pt. will accurately identify potential safety hazard using good safety awareness, and judgement 100% for ADLs, and IADLs.    Baseline Pt. continues to require cues    Time 12    Period Weeks    Status On-going    Target Date 03/01/20      OT LONG TERM GOAL  #9   TITLE Pt. will  navigate his w/c around obstacles with Supervision and 100% accuracy    Baseline Pt. continues to require cuing for obstacles on the left, however requires less cues overall. Pt. requires significant cues to engage his left UE during tasks. 07/21/2019 continues to improve with decreased assist at times    Time 12    Period Weeks    Status On-going    Target Date 03/01/20      OT LONG TERM GOAL  #10   TITLE Pt. will independently be able  to reach to place items into cabinetry, and closets.    Baseline Pt. continues to have difficulty with functional reaching conitnues to be limited.    Time 12    Period Weeks    Status On-going    Target Date 03/01/20      OT LONG TERM GOAL  #11   TITLE Pt. will increase BUE strength by  62mm grades in preparation for ADL transfers.    Baseline Pt. BUE strength conitnues to be limited. Pt. is able to tolerate increased resistive weights. Pt. continues to work towards improving overall strength for ADLs.    Time 12    Period Weeks    Status On-going    Target Date 03/01/20      OT LONG TERM GOAL  #12   TITLE Pt. will improve LUE functional reaching with the left engaging the trunk 100% of the time during ADL tasks with minimal cues.    Baseline Pt. continues to improve with LUE functional reaching. Fewer cues are required to engage his trunk for extended reaching.    Time 12    Period Weeks    Status On-going    Target Date 03/01/20                 Plan - 02/12/20 1724    Clinical Impression Statement Pt. requires verbal cues, and tactile cues initially to orient himself to the jacket in preparation for donning the jacket in the corect direction, and sequence.  Pt. continues torequire increased cues for problem-solving through each step of the task. Pt. continues to benefit from additional work on improving UE dressing skills. Pt. continues to work on improving UE strength, [facility] skills, and visual perceptual skills in order to improve, and maximize independence with ADLs, and IADL tasks.   OT Occupational Profile and History Problem Focused Assessment - Including review of records relating  to presenting problem    Occupational performance deficits (Please refer to evaluation for details): ADL's    Body Structure / Function / Physical Skills UE functional use;Coordination;FMC;Dexterity;Strength;ROM    Cognitive Skills Attention;Memory;Emotional;Problem Solve;Safety Awareness    Rehab Potential Good    Clinical Decision Making Several treatment options, min-mod task modification necessary    Comorbidities Affecting Occupational Performance: Presence of comorbidities impacting occupational performance    Comorbidities impacting occupational performance description:  Phyical, cognitive, visual,  medical comorbidities    Modification or Assistance to Complete Evaluation  No modification of tasks or assist necessary to complete eval    OT Frequency 2x / week    OT Duration 12 weeks    OT Treatment/Interventions Self-care/ADL training;DME and/or AE instruction;Therapeutic exercise;Therapeutic activities;Moist Heat;Cognitive remediation/compensation;Neuromuscular education;Visual/perceptual remediation/compensation;Coping strategies training;Patient/family education;Passive range of motion;Psychosocial skills training;Energy conservation;Functional Mobility Training    Consulted and Agree with Plan of Care Patient;Family member/caregiver    Family Member Consulted daughter [name]           Patient will benefit from skilled therapeutic intervention in order to improve the following deficits and impairments:   Body Structure / Function / Physical Skills: UE functional use,Coordination,FMC,Dexterity,Strength,ROM Cognitive Skills: Attention,Memory,Emotional,Problem Solve,Safety Awareness     Visit Diagnosis: Muscle weakness (generalized)  Other lack of coordination    Problem List Patient Active Problem List   Diagnosis Date Noted  . Sepsis ([place]) 10/18/2018    Harrel Carina, MS, OTR/L 02/12/2020, 5:25 PM  [place] MAIN [facility] SERVICES [address] [place], [place], [address] Phone: [phone]   Fax:  [phone]  Name: ANDRW MCGUIRT MRN: [id number] Date of Birth: [birth year]

## 2020-02-12 NOTE — Therapy (Signed)
[place] [facility] SERVICES [address] [place], [place], [address] Phone: [phone]   Fax:  [phone]  Physical Therapy Treatment  Patient Details  Name: Kenneth Summers MRN: [id number] Date of Birth: Feb 03, 1946 No data recorded  Encounter Date: 02/12/2020   PT End of Session - 02/12/20 1656    Visit Number [id number]    Number of Visits 252    Date for PT Re-Evaluation 04/19/20    Authorization Type Medicare reporting period starting 09/11/18; NO FOTO    Authorization Time Period goals last assessed 08/20/19    PT Start Time 0145    PT Stop Time 0230    PT Time Calculation (min) 45 min    Equipment Utilized During Treatment Gait belt    Activity Tolerance Patient tolerated treatment well;No increased pain    Behavior During Therapy WFL for tasks assessed/performed           Past Medical History:  Diagnosis Date  . Actinic keratosis   . Alcohol abuse   . APS (antiphospholipid syndrome) ([place])   . Basal cell carcinoma   . BPH (benign prostatic hyperplasia)   . CAD (coronary artery disease)   . Cellulitis   . Cervical spinal stenosis   . Chronic osteomyelitis ([place])   . CKD (chronic kidney disease)   . CKD (chronic kidney disease)   . Clostridium difficile diarrhea   . Depression   . Diplopia   . Fatigue   . GERD (gastroesophageal reflux disease)   . Hyperlipemia   . Hypertension   . Hypovitaminosis D   . IBS (irritable bowel syndrome)   . 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 Insomnia   . Left lumbosacral radiculopathy   . Moderate obstructive sleep apnea   . Osteomyelitis of foot ([place])   . PAD (peripheral artery disease) ([place])   . Pruritus   . RA (rheumatoid arthritis) ([place])   . Radiculopathy   . Restless legs syndrome   . RLS (restless legs syndrome)   . SLE (systemic lupus erythematosus related syndrome) ([place])   . 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 Stroke ([place])   . Toxic maculopathy of  both eyes     Past Surgical History:  Procedure Laterality Date  . CARDIAC CATHETERIZATION    . CAROTID ENDARTERECTOMY    . CERVICAL LAMINECTOMY    . CORONARY ANGIOPLASTY    . FRACTURE SURGERY    . fusion C5-6-7    . HERNIA REPAIR    . KNEE ARTHROSCOPY    . TONSILLECTOMY      There were no vitals filed for this visit.   Subjective Assessment - 02/12/20 1654    Subjective The patient reports he is feeling good today.    Pertinent History Patient is a 75 y.o. male who presents to outpatient physical therapy with a referral for medical diagnosis of CVA. This patient's chief complaints consist of left hemiplegia and overall deconditioning leading to the following functional deficits: dependent for ADLs, IADLs, unable to transfer to car, dependent transfers at home with hoyer lift, unable to walk or stand without significant assistance, difficulty with W/C navigation..    Limitations Sitting;Lifting;Standing;House hold activities;Writing;Walking;Other (comment)    Diagnostic tests Brain MRI 11/01/2017: IMPRESSION: 1. Acute right ACA territory infarct as demonstrated on prior CT imaging. No intracranial hemorrhage or significant mass effect. 2. 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  Patient Stated Goals wants to be able to walk again    Currently in Pain? No/denies    Pain Score 0-No pain    Pain Onset Today               TREATMENT:  Patient transferred to mat table from wheelchair with hoyer lift. Utilized patient's personal lift pad for ease of transfer;     Patient hooklying:   Rocking knees side to side x10    Rolling from supine to right sidelying with mod VCS for reaching with LUE and turning head and keeping LLE hip engaged for better rolling; Patient able to independently roll to left side on 3rd rep Continues to be challenged going to right side but becoming more independent with initiating the movement     Patient required modA  +1 for transition sidelying to sitting with cues for hand placement and trunk control;  Able to position hands properly to initiate the transition.  Patient then required mod cueing to continue push off with mod A x1 to sit fully up.     Sitting edge of mat table: Able to sit unsupported with feet on floor with neutral weight shift with visual cues for correction, requiring min A-stand by assist for safety Instructed patient in reaching various directions with each UE to challenge trunk control x2 min;  Instructed patient in catching ball and tossing x10 reps with caregiver and therapist providing min A for safety and positioning/reorienting to midline; Patient sitting with feet on floor/unsupported reaching with ball passing side/side with cues to avoid posterior loss of balance x5 reps;                       PT Education - 02/12/20 1655    Education Details posture, bed mobility techinque    Person(s) Educated Patient    Methods Explanation;Demonstration;Tactile cues;Verbal cues    Comprehension Verbalized understanding;Returned demonstration;Verbal cues required;Need further instruction;Tactile cues required            PT Short Term Goals - 01/26/20 1308      PT SHORT TERM GOAL #1   Title Be independent with initial home exercise program for self-management of symptoms.    Baseline HEP to be given at second session; advice to practice unsupported sitting at home (06/19/2018); 07/08/18: Performing at home, 6/29: needs assistance but is doing exercise program regularly; 08/22/18: adherent;    Time 2    Period Weeks    Status Achieved    Target Date 06/26/18             PT Long Term Goals - 01/26/20 1308      PT LONG TERM GOAL #1   Title Patient will complete all bed mobility with min A to improve functional independence for getting in and out of bed and adjusting in bed.     Baseline max A - total A reported by family (06/19/2018); 07/08/18: still requires maxA, dtr  reports improvement in rolling since starting therapy; 6/23: min A to supervision in clinic; not doing at home right; 8/5: Pt's daughter states that his rolling has improved, but still has difficulty with all other bed mobility; 10/23/18: pts daughter states that she is doing about 35% of the work if he is in proper position, he does uses the bed rails to help roll;  using Harrel Lemon for coming to sit EOB; 10/12: pts daughter states that she is doing about 30% of the work if he is in  proper position, he does uses the bed rails to help roll, but it is improving;  using Harrel Lemon for coming to sit EOB, 10/29: min A for sit to sidelying, rolling supervision, able to transition sidelying to sit with HHA; 12/21: min-mod A    Time 12    Period Weeks    Status Partially Met    Target Date 04/19/20      PT LONG TERM GOAL #2   Title Patient will complete sit <> stand transfer chair to chair with Min A and LRAD to improve functional independence for household and community mobility.     Baseline min A for STS, still requires elevated surface height to 27"    Time 12    Period Weeks    Status Partially Met    Target Date 04/19/20      PT LONG TERM GOAL #3   Title Patient will navigate power w/c with min A x 100 feet to improve mobility for household and short community distances.    Baseline required total A (06/12/2018); able to roll 20 feet with min A (06/20/2018); 07/08/18: Pt can go approximately 10' without assist per family;  10/23/2018: pt's daughter states that his power WC mobility has become 100% better with the new hand control, slowed speed, and with practice; 10/12: pt's daughter reports that he can perform household WC mobility and limited community ambulation with minA    Time 12    Period Weeks    Status Achieved    Target Date 04/19/20      PT LONG TERM GOAL #4   Title Patient will complete car transfers W/C to family SUV with min A using LRAD to improve his abilty to participate in community  activities.     Baseline unable to complete car transfers at all (06/12/2018); unable to complete car transfers at all (06/19/2018).; 07/08/18: Unable to perform at this time; 09/11/18: unable to perform at this time; 10/23/2018: unable to complete car transfers at all; 10/12:  unable to complete car transfers at all, 10/29: unable; 01/13/19: unable; 2/11: min-mod A +2, 3/10: min A+1, 12/21: mod A    Time 12    Period Weeks    Status Deferred    Target Date 04/19/20      PT LONG TERM GOAL #5   Title Patient will ambulate at least 10 feet with min A using LRAD to improve mobility for household and community distances.    Baseline unable to take steps (06/12/2018); able to shift R leg forward and back with max A and RW at edge of plinth (06/19/2018); 07/08/18: unable to ambulate at this time. 09/11/18: unable to perform at this time; 10/23/2018: unable to perform at this time; 10/12: unable to perform at this time, 10/29: unable at this time; 01/13/19: unable at this time, 2/11: min A to step with RLE, unable to step with LLE, 3/10: able to take 2 steps in parallel bars with min A for safety; 4/1: min A for walking 10 feet in parallel bars, 5/24: min A walking 10 feet in parallel bars; 6/21: no change 7/14: no change    Time 12    Period Weeks    Status Deferred    Target Date 04/19/20      PT LONG TERM GOAL #6   Title Patient will be able to safety navigate up/down ramp with power chair, exhibiting good safety awareness, mod I to safely enter/exit his home.    Baseline 2/11: supervision with intermittent  min A;4/1: supervision; 5/24: supervision    Time 12    Period Weeks    Status Partially Met    Target Date 04/19/20      PT LONG TERM GOAL #7   Title Patient will increase functional reach test to >15 inches in sitting to exhibit improved sitting balance and positioning and reduce fall risk;    Baseline 07/30/18: 12 inches; 09/11/18: 10-12 inches; 10/23/18: 12-13 inch; 10/12: 14.5", 10/29: 15.5 inch    Time 4     Period Weeks    Status Achieved    Target Date 04/19/20      PT LONG TERM GOAL #8   Title Patient will increase LLE quad strength to 3+/5 to improve functional strength for standing and mobility;    Baseline 10/28: 2+/5; 01/13/19: 2+/5, 2/11: 3-/5, 04/16/19: 3-/5, 05/08/19: 3-/5, 5/24: 3-/5, 6/21: 3-/5, 7/14: 3-/5, 12/21: 3-/5    Time 12    Period Weeks    Status Partially Met    Target Date 04/19/20      PT LONG TERM GOAL  #9   TITLE Patient will report no vertigo with provoking motions or positions in order to be able to perform ADLs and mobility tasks without symptoms.    Baseline 06/30/19: reports no dizziness in last week; 6/21: one episode this weekend; 7/14: experiencing one a week;    Time 12    Period Weeks    Status Partially Met    Target Date 04/19/20                 Plan - 02/12/20 1657    Clinical Impression Statement Patient requiring min A as well as verbal and tactile cues for supine to sidelying activities.  Patient has demonstrated a good improvement in this and is becoming more independent over course of the past 2 weeks.  The patient with better ability to sit with less posterior trunk lean, cueing still needed.  The patient continues to benefit from additional skilled PT intervention to further improve LE strength and ability to perform transitions for improved quality of life.    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     Examination-Activity Limitations Bed Mobility;Bend;Sit;Toileting;Stand;Stairs;Lift;Transfers;Squat;Locomotion Level;Carry;Dressing;Hygiene/Grooming;Continence    Examination-Participation Restrictions Yard Work;Interpersonal Relationship;Community Activity    Stability/Clinical Decision Making Evolving/Moderate complexity    Rehab Potential Fair    PT Frequency 3x / week    PT Duration 12 weeks    PT Treatment/Interventions ADLs/Self Care Home Management;Electrical Stimulation;Moist Heat;Cryotherapy;Gait training;Stair training;Functional mobility training;Therapeutic exercise;Balance training;Neuromuscular re-education;Cognitive remediation;Patient/family education;Orthotic Fit/Training;Wheelchair mobility training;Manual techniques;Passive range of motion;Energy conservation;Joint Manipulations;Canalith Repostioning;Therapeutic activities;Vestibular    PT Next Visit Plan Work on strength, sitting/standing balance, functional activities such as rolling, bed mobility, transfers; caregiver training    PT Home Exercise Plan no updates this session    Consulted and Agree with Plan of Care Patient;Family member/caregiver    Family Member Consulted DTR           Patient will benefit from skilled therapeutic intervention in order to improve the following deficits and impairments:  Abnormal gait,Decreased activity tolerance,Decreased cognition,Decreased endurance,Decreased knowledge  of use of DME,Decreased range of motion,Decreased skin integrity,Decreased strength,Impaired perceived functional ability,Impaired sensation,Impaired UE functional use,Improper body mechanics,Pain,Cardiopulmonary status limiting activity,Decreased balance,Decreased coordination,Decreased mobility,Difficulty walking,Impaired tone,Postural dysfunction,Dizziness  Visit Diagnosis: Muscle weakness (generalized)  Difficulty in walking, not elsewhere classified  Hemiplegia and hemiparesis following cerebral infarction affecting  left non-dominant side ([place])  Repeated falls     Problem List Patient Active Problem List   Diagnosis Date Noted  . Sepsis ([place]) 10/18/2018    Hal Morales PT, DPT 02/12/2020, 5:02 PM  [place] MAIN [facility] SERVICES [address] [place], [place], [address] Phone: [phone]   Fax:  [phone]  Name: BRAELIN COSTLOW MRN: [id number] Date of Birth: 1945/06/08

## 2020-02-16 ENCOUNTER — Other Ambulatory Visit: Payer: Self-pay

## 2020-02-16 ENCOUNTER — Ambulatory Visit: Payer: Medicare Other | Admitting: Occupational Therapy

## 2020-02-16 ENCOUNTER — Ambulatory Visit: Payer: Medicare Other

## 2020-02-16 ENCOUNTER — Encounter: Payer: Self-pay | Admitting: Occupational Therapy

## 2020-02-16 DIAGNOSIS — M6281 Muscle weakness (generalized): Secondary | ICD-10-CM | POA: Diagnosis not present

## 2020-02-16 DIAGNOSIS — R296 Repeated falls: Secondary | ICD-10-CM

## 2020-02-16 DIAGNOSIS — R262 Difficulty in walking, not elsewhere classified: Secondary | ICD-10-CM

## 2020-02-16 DIAGNOSIS — I69354 Hemiplegia and hemiparesis following cerebral infarction affecting left non-dominant side: Secondary | ICD-10-CM

## 2020-02-16 DIAGNOSIS — R278 Other lack of coordination: Secondary | ICD-10-CM

## 2020-02-16 NOTE — Therapy (Signed)
[place] [facility] SERVICES [address] [place], [place], [address] Phone: [phone]   Fax:  [phone]  Physical Therapy Treatment  Patient Details  Name: OCIE TINO MRN: [id number] Date of Birth: 1945-03-04 No data recorded  Encounter Date: 02/16/2020   PT End of Session - 02/16/20 1438    Visit Number [id number]    Number of Visits 252    Date for PT Re-Evaluation 04/19/20    Authorization Type Medicare reporting period starting 09/11/18; NO FOTO    Authorization Time Period goals last assessed 08/20/19    PT Start Time 0100    PT Stop Time 0145    PT Time Calculation (min) 45 min    Equipment Utilized During Treatment Gait belt    Activity Tolerance Patient tolerated treatment well;No increased pain    Behavior During Therapy WFL for tasks assessed/performed           Past Medical History:  Diagnosis Date  . Actinic keratosis   . Alcohol abuse   . APS (antiphospholipid syndrome) ([place])   . Basal cell carcinoma   . BPH (benign prostatic hyperplasia)   . CAD (coronary artery disease)   . Cellulitis   . Cervical spinal stenosis   . Chronic osteomyelitis ([place])   . CKD (chronic kidney disease)   . CKD (chronic kidney disease)   . Clostridium difficile diarrhea   . Depression   . Diplopia   . Fatigue   . GERD (gastroesophageal reflux disease)   . Hyperlipemia   . Hypertension   . Hypovitaminosis D   . IBS (irritable bowel syndrome)   . 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 Insomnia   . Left lumbosacral radiculopathy   . Moderate obstructive sleep apnea   . Osteomyelitis of foot ([place])   . PAD (peripheral artery disease) ([place])   . Pruritus   . RA (rheumatoid arthritis) ([place])   . Radiculopathy   . Restless legs syndrome   . RLS (restless legs syndrome)   . SLE (systemic lupus erythematosus related syndrome) ([place])   . 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 Stroke ([place])   . Toxic maculopathy of  both eyes     Past Surgical History:  Procedure Laterality Date  . CARDIAC CATHETERIZATION    . CAROTID ENDARTERECTOMY    . CERVICAL LAMINECTOMY    . CORONARY ANGIOPLASTY    . FRACTURE SURGERY    . fusion C5-6-7    . HERNIA REPAIR    . KNEE ARTHROSCOPY    . TONSILLECTOMY      There were no vitals filed for this visit.   Subjective Assessment - 02/16/20 1435    Subjective Patient reports that he slept well last night and is doing good today.    Pertinent History Patient is a 75 y.o. male who presents to outpatient physical therapy with a referral for medical diagnosis of CVA. This patient's chief complaints consist of left hemiplegia and overall deconditioning leading to the following functional deficits: dependent for ADLs, IADLs, unable to transfer to car, dependent transfers at home with hoyer lift, unable to walk or stand without significant assistance, difficulty with W/C navigation..    Limitations Sitting;Lifting;Standing;House hold activities;Writing;Walking;Other (comment)    Diagnostic tests Brain MRI 11/01/2017: IMPRESSION: 1. Acute right ACA territory infarct as demonstrated on prior CT imaging. No intracranial hemorrhage or significant mass effect. 2. 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    Patient Stated Goals wants to be able to walk again    Currently in Pain? No/denies    Pain Onset Today          TREATMENT:  Patient transferred to mat table from wheelchair with hoyer lift. Utilized patient's personal lift pad for ease of transfer;     Patient hooklying:    Rocking knees side to side x10  SAQ x10 each, AAROM on left after approx 25% of active movement   Rolling from supine to right sidelying with mod VCS for reaching with LUE and turning head and keeping LLE hip engaged for better rolling; Patient able to independently roll to left side on 3rd rep Continues to be challenged going to right side but becoming more independent  with initiating the movement     Patient required modA +1 for transition sidelying to sitting with cues for hand placement and trunk control;  Able to position hands properly to initiate the transition.  Patient then required mod cueing to continue push off with mod A x1 to sit fully up.     Sitting edge of mat table: Able to sit unsupported with feet on floor with neutral weight shift with visual cues for correction, requiring min A-stand by assist for safety Instructed patient in reaching various directions with each UE to challenge trunk control x2 min;  Instructed patient in catching ball and tossing x20 reps with caregiver and therapist providing min A for safety and positioning/reorienting to midline; Ball squeeze x15 reps, cueing for left LE adduction.                             PT Education - 02/16/20 1437    Education Details bed mobility, supine to sit, posture    Person(s) Educated Patient;Caregiver(s)    Methods Explanation;Demonstration;Tactile cues;Verbal cues    Comprehension Verbalized understanding;Tactile cues required;Need further instruction;Verbal cues required;Returned demonstration            PT Short Term Goals - 01/26/20 1308      PT SHORT TERM GOAL #1   Title Be independent with initial home exercise program for self-management of symptoms.    Baseline HEP to be given at second session; advice to practice unsupported sitting at home (06/19/2018); 07/08/18: Performing at home, 6/29: needs assistance but is doing exercise program regularly; 08/22/18: adherent;    Time 2    Period Weeks    Status Achieved    Target Date 06/26/18             PT Long Term Goals - 01/26/20 1308      PT LONG TERM GOAL #1   Title Patient will complete all bed mobility with min A to improve functional independence for getting in and out of bed and adjusting in bed.     Baseline max A - total A reported by family (06/19/2018); 07/08/18: still requires maxA,  dtr reports improvement in rolling since starting therapy; 6/23: min A to supervision in clinic; not doing at home right; 8/5: Pt's daughter states that his rolling has improved, but still has difficulty with all other bed mobility; 10/23/18: pts daughter states that she is doing about 35% of the work if he is in proper position, he does uses the bed rails to help roll;  using Michiel Sites for coming to sit EOB; 10/12: pts daughter states that she is doing about 30% of the work if he  is in proper position, he does uses the bed rails to help roll, but it is improving;  using Michiel Sites for coming to sit EOB, 10/29: min A for sit to sidelying, rolling supervision, able to transition sidelying to sit with HHA; 12/21: min-mod A    Time 12    Period Weeks    Status Partially Met    Target Date 04/19/20      PT LONG TERM GOAL #2   Title Patient will complete sit <> stand transfer chair to chair with Min A and LRAD to improve functional independence for household and community mobility.     Baseline min A for STS, still requires elevated surface height to 27"    Time 12    Period Weeks    Status Partially Met    Target Date 04/19/20      PT LONG TERM GOAL #3   Title Patient will navigate power w/c with min A x 100 feet to improve mobility for household and short community distances.    Baseline required total A (06/12/2018); able to roll 20 feet with min A (06/20/2018); 07/08/18: Pt can go approximately 10' without assist per family;  10/23/2018: pt's daughter states that his power WC mobility has become 100% better with the new hand control, slowed speed, and with practice; 10/12: pt's daughter reports that he can perform household WC mobility and limited community ambulation with minA    Time 12    Period Weeks    Status Achieved    Target Date 04/19/20      PT LONG TERM GOAL #4   Title Patient will complete car transfers W/C to family SUV with min A using LRAD to improve his abilty to participate in community  activities.     Baseline unable to complete car transfers at all (06/12/2018); unable to complete car transfers at all (06/19/2018).; 07/08/18: Unable to perform at this time; 09/11/18: unable to perform at this time; 10/23/2018: unable to complete car transfers at all; 10/12:  unable to complete car transfers at all, 10/29: unable; 01/13/19: unable; 2/11: min-mod A +2, 3/10: min A+1, 12/21: mod A    Time 12    Period Weeks    Status Deferred    Target Date 04/19/20      PT LONG TERM GOAL #5   Title Patient will ambulate at least 10 feet with min A using LRAD to improve mobility for household and community distances.    Baseline unable to take steps (06/12/2018); able to shift R leg forward and back with max A and RW at edge of plinth (06/19/2018); 07/08/18: unable to ambulate at this time. 09/11/18: unable to perform at this time; 10/23/2018: unable to perform at this time; 10/12: unable to perform at this time, 10/29: unable at this time; 01/13/19: unable at this time, 2/11: min A to step with RLE, unable to step with LLE, 3/10: able to take 2 steps in parallel bars with min A for safety; 4/1: min A for walking 10 feet in parallel bars, 5/24: min A walking 10 feet in parallel bars; 6/21: no change 7/14: no change    Time 12    Period Weeks    Status Deferred    Target Date 04/19/20      PT LONG TERM GOAL #6   Title Patient will be able to safety navigate up/down ramp with power chair, exhibiting good safety awareness, mod I to safely enter/exit his home.    Baseline 2/11: supervision  with intermittent min A;4/1: supervision; 5/24: supervision    Time 12    Period Weeks    Status Partially Met    Target Date 04/19/20      PT LONG TERM GOAL #7   Title Patient will increase functional reach test to >15 inches in sitting to exhibit improved sitting balance and positioning and reduce fall risk;    Baseline 07/30/18: 12 inches; 09/11/18: 10-12 inches; 10/23/18: 12-13 inch; 10/12: 14.5", 10/29: 15.5 inch    Time 4     Period Weeks    Status Achieved    Target Date 04/19/20      PT LONG TERM GOAL #8   Title Patient will increase LLE quad strength to 3+/5 to improve functional strength for standing and mobility;    Baseline 10/28: 2+/5; 01/13/19: 2+/5, 2/11: 3-/5, 04/16/19: 3-/5, 05/08/19: 3-/5, 5/24: 3-/5, 6/21: 3-/5, 7/14: 3-/5, 12/21: 3-/5    Time 12    Period Weeks    Status Partially Met    Target Date 04/19/20      PT LONG TERM GOAL  #9   TITLE Patient will report no vertigo with provoking motions or positions in order to be able to perform ADLs and mobility tasks without symptoms.    Baseline 06/30/19: reports no dizziness in last week; 6/21: one episode this weekend; 7/14: experiencing one a week;    Time 12    Period Weeks    Status Partially Met    Target Date 04/19/20                 Plan - 02/16/20 1439    Clinical Impression Statement The patient presents with improved ability to initiate bed mobility activities.  The patient was able to sit at edge of bed 10 mins with min to mod cueing for posture.  The patient making gains as of late with skilled PT services.  The patient continues to benefit from additional skilled PT services to improve postural strength and ability to perform transitional movements for improved quality of life.    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    Examination-Activity Limitations Bed  Mobility;Bend;Sit;Toileting;Stand;Stairs;Lift;Transfers;Squat;Locomotion Level;Carry;Dressing;Hygiene/Grooming;Continence    Examination-Participation Restrictions Yard Work;Interpersonal Relationship;Community Activity    Stability/Clinical Decision Making Evolving/Moderate complexity    Rehab Potential Fair    PT Frequency 3x / week    PT Duration 12 weeks    PT Treatment/Interventions ADLs/Self Care Home Management;Electrical Stimulation;Moist Heat;Cryotherapy;Gait training;Stair training;Functional mobility training;Therapeutic exercise;Balance training;Neuromuscular re-education;Cognitive remediation;Patient/family education;Orthotic Fit/Training;Wheelchair mobility training;Manual techniques;Passive range of motion;Energy conservation;Joint Manipulations;Canalith Repostioning;Therapeutic activities;Vestibular    PT Next Visit Plan Work on strength, sitting/standing balance, functional activities such as rolling, bed mobility, transfers; caregiver training    PT Home Exercise Plan no updates this session    Consulted and Agree with Plan of Care Patient;Family member/caregiver    Family Member Consulted DTR           Patient will benefit from skilled therapeutic intervention in order to improve the following deficits and impairments:  Abnormal gait,Decreased activity tolerance,Decreased cognition,Decreased endurance,Decreased knowledge of use of DME,Decreased range of motion,Decreased skin  integrity,Decreased strength,Impaired perceived functional ability,Impaired sensation,Impaired UE functional use,Improper body mechanics,Pain,Cardiopulmonary status limiting activity,Decreased balance,Decreased coordination,Decreased mobility,Difficulty walking,Impaired tone,Postural dysfunction,Dizziness  Visit Diagnosis: Muscle weakness (generalized)  Other lack of coordination  Difficulty in walking, not elsewhere classified  Hemiplegia and hemiparesis following cerebral infarction affecting left  non-dominant side ([place])  Repeated falls     Problem List Patient Active Problem List   Diagnosis Date Noted  . Sepsis ([place]) 10/18/2018    Hal Morales PT, DPT 02/16/2020, 2:47 PM  [place] [facility] SERVICES [address] [place], [place], [address] Phone: [phone]   Fax:  [phone]  Name: DANYELL AWBREY MRN: [id number] Date of Birth: 1945-05-03

## 2020-02-16 NOTE — Therapy (Signed)
[place] MAIN [facility] SERVICES [address] [place], [place], [address] Phone: [phone]   Fax:  [phone]  Occupational Therapy Treatment  Patient Details  Name: Kenneth Summers MRN: [id number] Date of Birth: Jan 28, 1946 No data recorded  Encounter Date: 02/16/2020   OT End of Session - 02/16/20 1351    Visit Number [id number]    Number of Visits 181    Date for OT Re-Evaluation 03/01/20    Authorization Type Progress report period starting 01/26/2020    OT Start Time 1345    OT Stop Time 1430    OT Time Calculation (min) 45 min    Activity Tolerance Patient tolerated treatment well    Behavior During Therapy [facility] for tasks assessed/performed           Past Medical History:  Diagnosis Date  . Actinic keratosis   . Alcohol abuse   . APS (antiphospholipid syndrome) ([place])   . Basal cell carcinoma   . BPH (benign prostatic hyperplasia)   . CAD (coronary artery disease)   . Cellulitis   . Cervical spinal stenosis   . Chronic osteomyelitis ([place])   . CKD (chronic kidney disease)   . CKD (chronic kidney disease)   . Clostridium difficile diarrhea   . Depression   . Diplopia   . Fatigue   . GERD (gastroesophageal reflux disease)   . Hyperlipemia   . Hypertension   . Hypovitaminosis D   . IBS (irritable bowel syndrome)   . 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 Insomnia   . Left lumbosacral radiculopathy   . Moderate obstructive sleep apnea   . Osteomyelitis of foot ([place])   . PAD (peripheral artery disease) ([place])   . Pruritus   . RA (rheumatoid arthritis) ([place])   . Radiculopathy   . Restless legs syndrome   . RLS (restless legs syndrome)   . SLE (systemic lupus erythematosus related syndrome) ([place])   . 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 Stroke ([place])   . Toxic maculopathy of both eyes     Past Surgical History:  Procedure Laterality Date  . CARDIAC CATHETERIZATION    . CAROTID ENDARTERECTOMY    .  CERVICAL LAMINECTOMY    . CORONARY ANGIOPLASTY    . FRACTURE SURGERY    . fusion C5-6-7    . HERNIA REPAIR    . KNEE ARTHROSCOPY    . TONSILLECTOMY      There were no vitals filed for this visit.   Subjective Assessment - 02/16/20 1351    Subjective  Pt. reports that his left hand is sore today    Patient is accompanied by: Family member    Pertinent History Pt. is a 75 y.o. 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 Pt. PMHx includes: Multiple Falls, Lupus, DJD, Renal Abscess, CVA. Pt. resides with his wife. Pt.'s wife and daughter assist with ADLs. Pt. has caregivers in  for 2 hours a day, 6 days a week. Pt. received Rehab services in acute care, at SNF for STR, and [facility]. Pt. is retired from [employer] for [employer] and [employer].    Currently in Pain? No/denies          OT TREATMENT   Therapeutic Exercise:  Pt. performed3#dowelfor UE strengthening secondary to weakness.Pt.tolerated increased dowel weight.Bilateral shoulder flexion, chest press,andcircular patterns were performedfor1-2 sets 20 reps each.Pt. worked on3# dumbbell ex.  forbilateralelbow flexion and extension, 3# for right and left. Pt. perfromedforearm supination/pronation, wrist flexion/extension 3# for the RUE, and 2# with the left for1 set of10-20and radial deviation 1 set 20 reps.  Pt.continuestomake progress with BUE strength, and tolerance for there. Ex.Pt.continues to present with weakness, as well asimpaired motor control, and motor planning.Pt. tolerated BUE exercises well, with no reportsofdiscomfort. Pt. required consistent verbal, visual, and tactile cues. Pt.continues torequire less cuesto perform UE strengthening exercises, technique, and formas well as visual demonstration prior to the exercises.Pt. continues to work on improving BUE strength, and [facility] skills in order to improve LUE functioning during ADLs, IADLs, and  tranfers.                        OT Education - 02/16/20 1351    Education Details BUE ther. ex    Person(s) Educated Patient    Methods Explanation;Demonstration    Comprehension Returned demonstration;Tactile cues required;Verbalized understanding;Need further instruction               OT Long Term Goals - 01/22/20 1329      OT LONG TERM GOAL #1   Title Pt. will increase left shoulder flexion AROM by 10 degrees to access cabinet/shelf.    Baseline Pt. continues to progress with left shoulder ROM .Shouler flexion 130, and continues to work on reaching with increasing emphasis on flexing trunk to improve functional reach.  07/21/19- shoulder flexion to 130 with effort    Time 12    Period Weeks    Status Not Met    Target Date 03/01/20      OT LONG TERM GOAL #2   Title Pt. will donn a shirt with Supervision.    Baseline Pt. continues to require modA donning a zip down jacket with consistent cues to initiate the correct direction of the jacket.  Min-modA mahaging the zipper. MinA for donning a T-shirt with assist to roll side to side to pull the shirt down over his back. 07/21/19 continues to require min assist at times for shirt/jacket    Time 12    Period Weeks    Status On-going    Target Date 03/01/20      OT LONG TERM GOAL #4   Title Pt. will improve left grip strength by 10# to be able to open a jar/container.    Baseline Pt. is making stedy progress with grip strength. Pt. is having a herder time opening a wide mouthed jar, however is able to open a smaller jar easier.    Time 12    Period Weeks    Status On-going    Target Date 03/02/19      OT LONG TERM GOAL #5   Title Pt. will improve left hand [facility] skills to be able to assist with buttoning/zipping.    Baseline Pt. has improved overall donning a jacket, and  manipulating the zipper on his jacket, however continues to require practice depending on the type of zipper jacket. Pt. continues to work  on improving bilateral [facility] skills for buttoning/zipping.  07/21/19 improving with buttons and zippers.    Time 12    Period Weeks    Status On-going    Target Date 03/01/20      OT LONG TERM GOAL #6   Title Pt. will demonstrate visual conmpensatory strategies for 100% of the time during ADLs    Baseline Pt.continues to require intermittent cues cues for left sides awareness.  Time 12    Period Weeks    Status Partially Met    Target Date 03/01/20      OT LONG TERM GOAL #8   Title Pt. will accurately identify potential safety hazard using good safety awareness, and judgement 100% for ADLs, and IADLs.    Baseline Pt. continues to require cues    Time 12    Period Weeks    Status On-going    Target Date 03/01/20      OT LONG TERM GOAL  #9   TITLE Pt. will  navigate his w/c around obstacles with Supervision and 100% accuracy    Baseline Pt. continues to require cuing for obstacles on the left, however requires less cues overall. Pt. requires significant cues to engage his left UE during tasks. 07/21/2019 continues to improve with decreased assist at times    Time 12    Period Weeks    Status On-going    Target Date 03/01/20      OT LONG TERM GOAL  #10   TITLE Pt. will independently be able  to reach to place items into cabinetry, and closets.    Baseline Pt. continues to have difficulty with functional reaching conitnues to be limited.    Time 12    Period Weeks    Status On-going    Target Date 03/01/20      OT LONG TERM GOAL  #11   TITLE Pt. will increase BUE strength by 59m grades in preparation for ADL transfers.    Baseline Pt. BUE strength conitnues to be limited. Pt. is able to tolerate increased resistive weights. Pt. continues to work towards improving overall strength for ADLs.    Time 12    Period Weeks    Status On-going    Target Date 03/01/20      OT LONG TERM GOAL  #12   TITLE Pt. will improve LUE functional reaching with the left engaging the trunk 100% of  the time during ADL tasks with minimal cues.    Baseline Pt. continues to improve with LUE functional reaching. Fewer cues are required to engage his trunk for extended reaching.    Time 12    Period Weeks    Status On-going    Target Date 03/01/20                 Plan - 02/16/20 1352    Clinical Impression Statement Pt.continuestomake progress with BUE strength, and tolerance for there. Ex.Pt.continues to present with weakness, as well asimpaired motor control, and motor planning.Pt. tolerated BUE exercises well, with no reportsofdiscomfort. Pt. required consistent verbal, visual, and tactile cues. Pt.continues torequire less cuesto perform UE strengthening exercises, technique, and formas well as visual demonstration prior to the exercises.Pt. continues to work on improving BUE strength, and [facility] in order to improve LUE functioning during ADLs, IADLs, and tranfers.     OT Occupational Profile and History Problem Focused Assessment - Including review of records relating to presenting problem    Occupational performance deficits (Please refer to evaluation for details): ADL's    Body Structure / Function / Physical Skills UE functional use;Coordination;FMC;Dexterity;Strength;ROM    Cognitive Skills Attention;Memory;Emotional;Problem Solve;Safety Awareness    Rehab Potential Good    Clinical Decision Making Several treatment options, min-mod task modification necessary    Comorbidities Affecting Occupational Performance: Presence of comorbidities impacting occupational performance    Comorbidities impacting occupational performance description: Phyical, cognitive, visual,  medical comorbidities    Modification or  Assistance to Complete Evaluation  No modification of tasks or assist necessary to complete eval    OT Frequency 2x / week    OT Duration 12 weeks    OT Treatment/Interventions Self-care/ADL training;DME and/or AE instruction;Therapeutic exercise;Therapeutic  activities;Moist Heat;Cognitive remediation/compensation;Neuromuscular education;Visual/perceptual remediation/compensation;Coping strategies training;Patient/family education;Passive range of motion;Psychosocial skills training;Energy conservation;Functional Mobility Training    Consulted and Agree with Plan of Care Patient;Family member/caregiver    Family Member Consulted daughter [name]           Patient will benefit from skilled therapeutic intervention in order to improve the following deficits and impairments:   Body Structure / Function / Physical Skills: UE functional use,Coordination,FMC,Dexterity,Strength,ROM Cognitive Skills: Attention,Memory,Emotional,Problem Solve,Safety Awareness     Visit Diagnosis: Muscle weakness (generalized)  Other lack of coordination    Problem List Patient Active Problem List   Diagnosis Date Noted  . Sepsis ([place]) 10/18/2018    Harrel Carina, MS, OTR/L 02/16/2020, 1:54 PM  [place] MAIN [facility] SERVICES [address] [place], [place], [address] Phone: [phone]   Fax:  [phone]  Name: Kenneth Summers MRN: [id number] Date of Birth: 16-Nov-1945

## 2020-02-18 ENCOUNTER — Other Ambulatory Visit: Payer: Self-pay

## 2020-02-18 ENCOUNTER — Ambulatory Visit: Payer: Medicare Other

## 2020-02-18 ENCOUNTER — Ambulatory Visit: Payer: Medicare Other | Admitting: Occupational Therapy

## 2020-02-18 DIAGNOSIS — M6281 Muscle weakness (generalized): Secondary | ICD-10-CM

## 2020-02-18 DIAGNOSIS — R262 Difficulty in walking, not elsewhere classified: Secondary | ICD-10-CM

## 2020-02-18 DIAGNOSIS — I69354 Hemiplegia and hemiparesis following cerebral infarction affecting left non-dominant side: Secondary | ICD-10-CM

## 2020-02-18 DIAGNOSIS — R278 Other lack of coordination: Secondary | ICD-10-CM

## 2020-02-18 NOTE — Therapy (Signed)
[place] MAIN [facility] SERVICES [address] [place], [place], [address] Phone: [phone]   Fax:  [phone]  Physical Therapy Treatment  Patient Details  Name: Kenneth Summers MRN: [id number] Date of Birth: 1945-05-08 No data recorded  Encounter Date: 02/18/2020   PT End of Session - 02/18/20 1329    Visit Number [id number]    Number of Visits 252    Date for PT Re-Evaluation 04/19/20    Authorization Type Medicare reporting period starting 09/11/18; NO FOTO    Authorization Time Period goals last assessed 08/20/19    PT Start Time 0100    PT Stop Time 0145    PT Time Calculation (min) 45 min    Equipment Utilized During Treatment Gait belt    Activity Tolerance Patient tolerated treatment well;No increased pain    Behavior During Therapy WFL for tasks assessed/performed           Past Medical History:  Diagnosis Date  . Actinic keratosis   . Alcohol abuse   . APS (antiphospholipid syndrome) ([place])   . Basal cell carcinoma   . BPH (benign prostatic hyperplasia)   . CAD (coronary artery disease)   . Cellulitis   . Cervical spinal stenosis   . Chronic osteomyelitis ([place])   . CKD (chronic kidney disease)   . CKD (chronic kidney disease)   . Clostridium difficile diarrhea   . Depression   . Diplopia   . Fatigue   . GERD (gastroesophageal reflux disease)   . Hyperlipemia   . Hypertension   . Hypovitaminosis D   . IBS (irritable bowel syndrome)   . 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 Insomnia   . Left lumbosacral radiculopathy   . Moderate obstructive sleep apnea   . Osteomyelitis of foot ([place])   . PAD (peripheral artery disease) ([place])   . Pruritus   . RA (rheumatoid arthritis) ([place])   . Radiculopathy   . Restless legs syndrome   . RLS (restless legs syndrome)   . SLE (systemic lupus erythematosus related syndrome) ([place])   . 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 Stroke ([place])   . Toxic maculopathy of  both eyes     Past Surgical History:  Procedure Laterality Date  . CARDIAC CATHETERIZATION    . CAROTID ENDARTERECTOMY    . CERVICAL LAMINECTOMY    . CORONARY ANGIOPLASTY    . FRACTURE SURGERY    . fusion C5-6-7    . HERNIA REPAIR    . KNEE ARTHROSCOPY    . TONSILLECTOMY      There were no vitals filed for this visit.   Subjective Assessment - 02/18/20 1328    Subjective The patient reports he is having a good day today.    Pertinent History Patient is a 75 y.o. male who presents to outpatient physical therapy with a referral for medical diagnosis of CVA. This patient's chief complaints consist of left hemiplegia and overall deconditioning leading to the following functional deficits: dependent for ADLs, IADLs, unable to transfer to car, dependent transfers at home with hoyer lift, unable to walk or stand without significant assistance, difficulty with W/C navigation..    Limitations Sitting;Lifting;Standing;House hold activities;Writing;Walking;Other (comment)    Diagnostic tests Brain MRI 11/01/2017: IMPRESSION: 1. Acute right ACA territory infarct as demonstrated on prior CT imaging. No intracranial hemorrhage or significant mass effect. 2. 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  Patient Stated Goals wants to be able to walk again    Currently in Pain? No/denies    Pain Score 0-No pain    Pain Onset Today            Therapeutic exercises:   Seated Scapular row with green TB- x 10 reps bilaterally Seated Upper trunk ext with shouler flex ( verbal cues for technique ) x 10 reps Seated knee extension (AROM on right and AAROM - patient performed approx 25% effort. X 10 reps Seated knee flex with green TB on right x 10 reps; Yellow TB on left x 10 (patient with min ability to perform). Seated abdominal crunch with min assist x 12 reps (verbal and tactile cues to perform correctly)   Neuromuscular re-education:  Static standing in Parallel  bars:  (Max assist to stand with patient assisting approx 25% effort to stand) 4 trials of standing: 1) 40 sec 2) 40 sec 3) 55 sec 4) 1 min 10 sec Patient exhibited forward flexed posture with poor ability to extend hips/trunk (min improvement with verbal and tactile cues) but did improve with physical assist.  Patient required max assist of 1 to maintain standing using gait belt, blocking knees, and Increased UE Support on bars.   Seated balloon tapping with erect sitting posture x 1 min x 2 trials.    Clinical impression: Patient able to progress back to standing activities requiring maximal assist today. He was able to stand for several bouts focusing on posture and trunk/quad control. Patient was receptive to all cues, motivated, and participated well today with appropriate cues. Patient does present with ongoing weakness and remains at high risk of falling. He will benefit from continued skilled PT services to continue to focus on strengthening, balance, and mobility as appropriate.                        PT Education - 02/18/20 1327    Education Details standing posture, review HEP    Person(s) Educated Patient;Caregiver(s)    Methods Explanation;Demonstration;Verbal cues;Tactile cues    Comprehension Verbalized understanding;Tactile cues required;Returned demonstration;Need further instruction;Verbal cues required            PT Short Term Goals - 01/26/20 1308      PT SHORT TERM GOAL #1   Title Be independent with initial home exercise program for self-management of symptoms.    Baseline HEP to be given at second session; advice to practice unsupported sitting at home (06/19/2018); 07/08/18: Performing at home, 6/29: needs assistance but is doing exercise program regularly; 08/22/18: adherent;    Time 2    Period Weeks    Status Achieved    Target Date 06/26/18             PT Long Term Goals - 01/26/20 1308      PT LONG TERM GOAL #1   Title Patient will  complete all bed mobility with min A to improve functional independence for getting in and out of bed and adjusting in bed.     Baseline max A - total A reported by family (06/19/2018); 07/08/18: still requires maxA, dtr reports improvement in rolling since starting therapy; 6/23: min A to supervision in clinic; not doing at home right; 8/5: Pt's daughter states that his rolling has improved, but still has difficulty with all other bed mobility; 10/23/18: pts daughter states that she is doing about 35% of the work if he is in proper position, he does  uses the bed rails to help roll;  using Harrel Lemon for coming to sit EOB; 10/12: pts daughter states that she is doing about 30% of the work if he is in proper position, he does uses the bed rails to help roll, but it is improving;  using Harrel Lemon for coming to sit EOB, 10/29: min A for sit to sidelying, rolling supervision, able to transition sidelying to sit with HHA; 12/21: min-mod A    Time 12    Period Weeks    Status Partially Met    Target Date 04/19/20      PT LONG TERM GOAL #2   Title Patient will complete sit <> stand transfer chair to chair with Min A and LRAD to improve functional independence for household and community mobility.     Baseline min A for STS, still requires elevated surface height to 27"    Time 12    Period Weeks    Status Partially Met    Target Date 04/19/20      PT LONG TERM GOAL #3   Title Patient will navigate power w/c with min A x 100 feet to improve mobility for household and short community distances.    Baseline required total A (06/12/2018); able to roll 20 feet with min A (06/20/2018); 07/08/18: Pt can go approximately 10' without assist per family;  10/23/2018: pt's daughter states that his power WC mobility has become 100% better with the new hand control, slowed speed, and with practice; 10/12: pt's daughter reports that he can perform household WC mobility and limited community ambulation with minA    Time 12    Period Weeks     Status Achieved    Target Date 04/19/20      PT LONG TERM GOAL #4   Title Patient will complete car transfers W/C to family SUV with min A using LRAD to improve his abilty to participate in community activities.     Baseline unable to complete car transfers at all (06/12/2018); unable to complete car transfers at all (06/19/2018).; 07/08/18: Unable to perform at this time; 09/11/18: unable to perform at this time; 10/23/2018: unable to complete car transfers at all; 10/12:  unable to complete car transfers at all, 10/29: unable; 01/13/19: unable; 2/11: min-mod A +2, 3/10: min A+1, 12/21: mod A    Time 12    Period Weeks    Status Deferred    Target Date 04/19/20      PT LONG TERM GOAL #5   Title Patient will ambulate at least 10 feet with min A using LRAD to improve mobility for household and community distances.    Baseline unable to take steps (06/12/2018); able to shift R leg forward and back with max A and RW at edge of plinth (06/19/2018); 07/08/18: unable to ambulate at this time. 09/11/18: unable to perform at this time; 10/23/2018: unable to perform at this time; 10/12: unable to perform at this time, 10/29: unable at this time; 01/13/19: unable at this time, 2/11: min A to step with RLE, unable to step with LLE, 3/10: able to take 2 steps in parallel bars with min A for safety; 4/1: min A for walking 10 feet in parallel bars, 5/24: min A walking 10 feet in parallel bars; 6/21: no change 7/14: no change    Time 12    Period Weeks    Status Deferred    Target Date 04/19/20      PT LONG TERM GOAL #6  Title Patient will be able to safety navigate up/down ramp with power chair, exhibiting good safety awareness, mod I to safely enter/exit his home.    Baseline 2/11: supervision with intermittent min A;4/1: supervision; 5/24: supervision    Time 12    Period Weeks    Status Partially Met    Target Date 04/19/20      PT LONG TERM GOAL #7   Title Patient will increase functional reach test to >15  inches in sitting to exhibit improved sitting balance and positioning and reduce fall risk;    Baseline 07/30/18: 12 inches; 09/11/18: 10-12 inches; 10/23/18: 12-13 inch; 10/12: 14.5", 10/29: 15.5 inch    Time 4    Period Weeks    Status Achieved    Target Date 04/19/20      PT LONG TERM GOAL #8   Title Patient will increase LLE quad strength to 3+/5 to improve functional strength for standing and mobility;    Baseline 10/28: 2+/5; 01/13/19: 2+/5, 2/11: 3-/5, 04/16/19: 3-/5, 05/08/19: 3-/5, 5/24: 3-/5, 6/21: 3-/5, 7/14: 3-/5, 12/21: 3-/5    Time 12    Period Weeks    Status Partially Met    Target Date 04/19/20      PT LONG TERM GOAL  #9   TITLE Patient will report no vertigo with provoking motions or positions in order to be able to perform ADLs and mobility tasks without symptoms.    Baseline 06/30/19: reports no dizziness in last week; 6/21: one episode this weekend; 7/14: experiencing one a week;    Time 12    Period Weeks    Status Partially Met    Target Date 04/19/20                 Plan - 02/18/20 1411    Clinical Impression Statement Patient able to progress back to standing activities requiring maximal assist today. He was able to stand for several bouts focusing on posture and trunk/quad control. Patient was receptive to all cues, motivated, and participated well today with appropriate cues. Patient does present with ongoing weakness and remains at high risk of falling. He will benefit from continued skilled PT services to continue to focus on strengthening, balance, and mobility as appropriate.    Personal Factors and Comorbidities Age;Comorbidity 3+    Comorbidities Relevant past medical history and comorbidities include long term steroid use for lupus, CKD, chronic osteomyelitis, cervical spine stenosis, BPH, APS, alcohol abuse, peripheral artery disease, depression, GERD, obstructive sleep apnea, HTN, IBS, IgA deficiency, lumbar radiculopathy, RA, Stroke, toxic maculopathy in  both eyes, systemic lupus erythematosus related syndrome, cardiac catheterization, carotid endarterectomy, cervical laminectomy, coronary angioplasty, Fusion C5-C7, knee arthroplasty, lumbar surgery.    Examination-Activity Limitations Bed Mobility;Bend;Sit;Toileting;Stand;Stairs;Lift;Transfers;Squat;Locomotion Level;Carry;Dressing;Hygiene/Grooming;Continence    Examination-Participation Restrictions Yard Work;Interpersonal Relationship;Community Activity    Stability/Clinical Decision Making Evolving/Moderate complexity    Rehab Potential Fair    PT Frequency 3x / week    PT Duration 12 weeks    PT Treatment/Interventions ADLs/Self Care Home Management;Electrical Stimulation;Moist Heat;Cryotherapy;Gait training;Stair training;Functional mobility training;Therapeutic exercise;Balance training;Neuromuscular re-education;Cognitive remediation;Patient/family education;Orthotic Fit/Training;Wheelchair mobility training;Manual techniques;Passive range of motion;Energy conservation;Joint Manipulations;Canalith Repostioning;Therapeutic activities;Vestibular    PT Next Visit Plan Work on strength, sitting/standing balance, functional activities such as rolling, bed mobility, transfers; caregiver training    PT Home Exercise Plan no updates this session    Consulted and Agree with Plan of Care Patient;Family member/caregiver    Family Member Consulted DTR  Patient will benefit from skilled therapeutic intervention in order to improve the following deficits and impairments:  Abnormal gait,Decreased activity tolerance,Decreased cognition,Decreased endurance,Decreased knowledge of use of DME,Decreased range of motion,Decreased skin integrity,Decreased strength,Impaired perceived functional ability,Impaired sensation,Impaired UE functional use,Improper body mechanics,Pain,Cardiopulmonary status limiting activity,Decreased balance,Decreased coordination,Decreased mobility,Difficulty walking,Impaired  tone,Postural dysfunction,Dizziness  Visit Diagnosis: Muscle weakness (generalized)  Other lack of coordination  Difficulty in walking, not elsewhere classified  Hemiplegia and hemiparesis following cerebral infarction affecting left non-dominant side [facility])     Problem List Patient Active Problem List   Diagnosis Date Noted  . Sepsis ([place]) 10/18/2018   Ollen Bowl, PT Hal Morales PT, DPT 02/18/2020, 2:39 PM  [place] [facility]IN [facility] SERVICES [address] [place], [place], [address] Phone: [phone]   Fax:  [phone]  Name: Kenneth Summers MRN: [id number] Date of Birth: 1945/03/06

## 2020-02-19 ENCOUNTER — Other Ambulatory Visit: Payer: Self-pay

## 2020-02-19 ENCOUNTER — Emergency Department: Payer: Medicare Other

## 2020-02-19 ENCOUNTER — Ambulatory Visit: Payer: Medicare Other

## 2020-02-19 ENCOUNTER — Emergency Department
Admission: EM | Admit: 2020-02-19 | Discharge: 2020-02-20 | Disposition: A | Discharge status: Home / Self Care | Payer: Medicare Other | Attending: Emergency Medicine | Admitting: Emergency Medicine

## 2020-02-19 ENCOUNTER — Ambulatory Visit: Payer: Medicare Other | Admitting: Occupational Therapy

## 2020-02-19 DIAGNOSIS — Z7982 Long term (current) use of aspirin: Secondary | ICD-10-CM | POA: Diagnosis not present

## 2020-02-19 DIAGNOSIS — Z20822 Contact with and (suspected) exposure to covid-19: Secondary | ICD-10-CM | POA: Diagnosis not present

## 2020-02-19 DIAGNOSIS — M6281 Muscle weakness (generalized): Secondary | ICD-10-CM

## 2020-02-19 DIAGNOSIS — I2510 Atherosclerotic heart disease of native coronary artery without angina pectoris: Secondary | ICD-10-CM | POA: Diagnosis not present

## 2020-02-19 DIAGNOSIS — R278 Other lack of coordination: Secondary | ICD-10-CM

## 2020-02-19 DIAGNOSIS — Z7901 Long term (current) use of anticoagulants: Secondary | ICD-10-CM | POA: Insufficient documentation

## 2020-02-19 DIAGNOSIS — N189 Chronic kidney disease, unspecified: Secondary | ICD-10-CM | POA: Insufficient documentation

## 2020-02-19 DIAGNOSIS — R569 Unspecified convulsions: Secondary | ICD-10-CM | POA: Diagnosis not present

## 2020-02-19 DIAGNOSIS — R296 Repeated falls: Secondary | ICD-10-CM

## 2020-02-19 DIAGNOSIS — R262 Difficulty in walking, not elsewhere classified: Secondary | ICD-10-CM

## 2020-02-19 DIAGNOSIS — Z85828 Personal history of other malignant neoplasm of skin: Secondary | ICD-10-CM | POA: Diagnosis not present

## 2020-02-19 DIAGNOSIS — Z955 Presence of coronary angioplasty implant and graft: Secondary | ICD-10-CM | POA: Diagnosis not present

## 2020-02-19 DIAGNOSIS — I69354 Hemiplegia and hemiparesis following cerebral infarction affecting left non-dominant side: Secondary | ICD-10-CM

## 2020-02-19 DIAGNOSIS — Z87891 Personal history of nicotine dependence: Secondary | ICD-10-CM | POA: Diagnosis not present

## 2020-02-19 DIAGNOSIS — I1310 Hypertensive heart and chronic kidney disease without heart failure, with stage 1 through stage 4 chronic kidney disease, or unspecified chronic kidney disease: Secondary | ICD-10-CM | POA: Diagnosis not present

## 2020-02-19 DIAGNOSIS — Z79899 Other long term (current) drug therapy: Secondary | ICD-10-CM | POA: Diagnosis not present

## 2020-02-19 LAB — CBC
HCT: 42.4 % (ref 39.0–52.0)
Hemoglobin: 13.2 g/dL (ref 13.0–17.0)
MCH: 28.8 pg (ref 26.0–34.0)
MCHC: 31.1 g/dL (ref 30.0–36.0)
MCV: 92.4 fL (ref 80.0–100.0)
Platelets: 207 10*3/uL (ref 150–400)
RBC: 4.59 MIL/uL (ref 4.22–5.81)
RDW: 15.2 % (ref 11.5–15.5)
WBC: 12 10*3/uL — ABNORMAL HIGH (ref 4.0–10.5)
nRBC: 0 % (ref 0.0–0.2)

## 2020-02-19 LAB — COMPREHENSIVE METABOLIC PANEL
ALT: 28 U/L (ref 0–44)
AST: 29 U/L (ref 15–41)
Albumin: 3.6 g/dL (ref 3.5–5.0)
Alkaline Phosphatase: 85 U/L (ref 38–126)
Anion gap: 12 (ref 5–15)
BUN: 32 mg/dL — ABNORMAL HIGH (ref 8–23)
CO2: 24 mmol/L (ref 22–32)
Calcium: 8.8 mg/dL — ABNORMAL LOW (ref 8.9–10.3)
Chloride: 104 mmol/L (ref 98–111)
Creatinine, Ser: 1.18 mg/dL (ref 0.61–1.24)
GFR, Estimated: >60 mL/min (ref >60)
Glucose, Bld: 120 mg/dL — ABNORMAL HIGH (ref 70–99)
Potassium: 4.4 mmol/L (ref 3.5–5.1)
Sodium: 140 mmol/L (ref 135–145)
Total Bilirubin: 0.8 mg/dL (ref 0.3–1.2)
Total Protein: 7.8 g/dL (ref 6.5–8.1)

## 2020-02-19 LAB — CBG MONITORING, ED: Glucose-Capillary: 95 mg/dL (ref 70–99)

## 2020-02-19 LAB — TROPONIN I (HIGH SENSITIVITY)
Troponin I (High Sensitivity): 23 ng/L — ABNORMAL HIGH (ref <18)
Troponin I (High Sensitivity): 27 ng/L — ABNORMAL HIGH (ref <18)

## 2020-02-19 LAB — RESP PANEL BY RT-PCR (FLU A&B, COVID) ARPGX2
Influenza A by PCR: NEGATIVE
Influenza B by PCR: NEGATIVE
SARS Coronavirus 2 by RT PCR: NEGATIVE

## 2020-02-19 MED ORDER — ROPINIROLE HCL 1 MG PO TABS: 2.0000 mg | ORAL_TABLET | Freq: Every day | ORAL | Status: DC

## 2020-02-19 MED ORDER — LAMOTRIGINE 25 MG PO TABS
25.0000 mg | ORAL_TABLET | Freq: Two times a day (BID) | ORAL | Status: DC
  Administered 2020-02-19: 25 mg via ORAL
  Filled 2020-02-19: qty 1

## 2020-02-19 MED ORDER — GABAPENTIN 300 MG PO CAPS
400.0000 mg | ORAL_CAPSULE | Freq: Every day | ORAL | Status: DC
  Administered 2020-02-19: 400 mg via ORAL
  Filled 2020-02-19: qty 1

## 2020-02-19 MED ORDER — WARFARIN SODIUM 5 MG PO TABS: 5.0000 mg | ORAL_TABLET | Freq: Once | ORAL | Status: DC

## 2020-02-19 MED ORDER — QUETIAPINE FUMARATE 25 MG PO TABS
25.0000 mg | ORAL_TABLET | Freq: Every day | ORAL | Status: DC
  Administered 2020-02-19: 25 mg via ORAL
  Filled 2020-02-19: qty 1

## 2020-02-19 NOTE — ED Notes (Signed)
First RN note:  Pt called as a code at the lower level elevators.  Pt had seizure like activity where his eyes twitched from the left to the right, his head back, and then drooped to the right with his mouth drooped to the right.  All dropping has corrected post episode, he is alert but unable to answer if he felt poorly.  Pt states no problems currently.

## 2020-02-19 NOTE — Therapy (Signed)
[place] MAIN [facility] SERVICES [address] [place], [place], [address] Phone: [phone]   Fax:  [phone]  Physical Therapy Treatment  Patient Details  Name: Kenneth Summers MRN: [id number] Date of Birth: Apr 15, 1945 No data recorded  Encounter Date: 02/19/2020   PT End of Session - 02/19/20 1548    Visit Number [id number]    Number of Visits 252    Date for PT Re-Evaluation 04/19/20    Authorization Type Medicare reporting period starting 09/11/18; NO FOTO    Authorization Time Period goals last assessed 08/20/19    PT Start Time 0145    PT Stop Time 0230    PT Time Calculation (min) 45 min    Equipment Utilized During Treatment Gait belt    Activity Tolerance Patient tolerated treatment well;No increased pain    Behavior During Therapy WFL for tasks assessed/performed           Past Medical History:  Diagnosis Date  . Actinic keratosis   . Alcohol abuse   . APS (antiphospholipid syndrome) ([place])   . Basal cell carcinoma   . BPH (benign prostatic hyperplasia)   . CAD (coronary artery disease)   . Cellulitis   . Cervical spinal stenosis   . Chronic osteomyelitis ([place])   . CKD (chronic kidney disease)   . CKD (chronic kidney disease)   . Clostridium difficile diarrhea   . Depression   . Diplopia   . Fatigue   . GERD (gastroesophageal reflux disease)   . Hyperlipemia   . Hypertension   . Hypovitaminosis D   . IBS (irritable bowel syndrome)   . 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 Insomnia   . Left lumbosacral radiculopathy   . Moderate obstructive sleep apnea   . Osteomyelitis of foot ([place])   . PAD (peripheral artery disease) ([place])   . Pruritus   . RA (rheumatoid arthritis) ([place])   . Radiculopathy   . Restless legs syndrome   . RLS (restless legs syndrome)   . SLE (systemic lupus erythematosus related syndrome) ([place])   . 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 Stroke ([place])   . Toxic maculopathy of  both eyes     Past Surgical History:  Procedure Laterality Date  . CARDIAC CATHETERIZATION    . CAROTID ENDARTERECTOMY    . CERVICAL LAMINECTOMY    . CORONARY ANGIOPLASTY    . FRACTURE SURGERY    . fusion C5-6-7    . HERNIA REPAIR    . KNEE ARTHROSCOPY    . TONSILLECTOMY      There were no vitals filed for this visit.   Subjective Assessment - 02/19/20 1547    Subjective The patient reports he is feeling good again today.    Pertinent History Patient is a 75 y.o. male who presents to outpatient physical therapy with a referral for medical diagnosis of CVA. This patient's chief complaints consist of left hemiplegia and overall deconditioning leading to the following functional deficits: dependent for ADLs, IADLs, unable to transfer to car, dependent transfers at home with hoyer lift, unable to walk or stand without significant assistance, difficulty with W/C navigation..    Limitations Sitting;Lifting;Standing;House hold activities;Writing;Walking;Other (comment)    Diagnostic tests Brain MRI 11/01/2017: IMPRESSION: 1. Acute right ACA territory infarct as demonstrated on prior CT imaging. No intracranial hemorrhage or significant mass effect. 2. 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  Patient Stated Goals wants to be able to walk again    Currently in Pain? No/denies    Pain Score 0-No pain    Pain Onset Today                   Therapeutic exercises:   Seated Scapular row with green TB- x 10 reps bilaterally Seated Upper trunk ext with shouler flex ( verbal cues for technique ) x 10 reps Seated knee flex with green TB on right x 10 reps; Yellow TB on left x 10 (patient with min ability to perform). Seated abdominal crunch with min assist x 15 reps (verbal and tactile cues to perform correctly)   Neuromuscular re-education:  Static standing in Parallel bars:  (Max assist to stand with patient assisting approx 25% effort to  stand) 4 trials of standing: 1) 60sec 2) 60 sec 3) 62 sec 4) 58 sec Patient required rest breaks between sessions Patient continues to exhibit forward flexed posture yet improved ability to extend hips/trunk (with verbal and tactile cues) and improved overall right LE quad control. Patient less distracted during standing trials. Patient required max assist of 1 to maintain standing using gait belt, blocking knees, and UE Support on bars.                           PT Education - 02/19/20 1548    Education Details posture, hand and foot position for initiating standing    Person(s) Educated Patient    Methods Explanation;Demonstration;Tactile cues;Verbal cues    Comprehension Verbalized understanding;Returned demonstration;Verbal cues required;Need further instruction;Tactile cues required            PT Short Term Goals - 01/26/20 1308      PT SHORT TERM GOAL #1   Title Be independent with initial home exercise program for self-management of symptoms.    Baseline HEP to be given at second session; advice to practice unsupported sitting at home (06/19/2018); 07/08/18: Performing at home, 6/29: needs assistance but is doing exercise program regularly; 08/22/18: adherent;    Time 2    Period Weeks    Status Achieved    Target Date 06/26/18             PT Long Term Goals - 01/26/20 1308      PT LONG TERM GOAL #1   Title Patient will complete all bed mobility with min A to improve functional independence for getting in and out of bed and adjusting in bed.     Baseline max A - total A reported by family (06/19/2018); 07/08/18: still requires maxA, dtr reports improvement in rolling since starting therapy; 6/23: min A to supervision in clinic; not doing at home right; 8/5: Pt's daughter states that his rolling has improved, but still has difficulty with all other bed mobility; 10/23/18: pts daughter states that she is doing about 35% of the work if he is in proper position,  he does uses the bed rails to help roll;  using Hoyer for coming to sit EOB; 10/12: pts daughter states that she is doing about 30% of the work if he is in proper position, he does uses the bed rails to help roll, but it is improving;  using Hoyer for coming to sit EOB, 10/29: min A for sit to sidelying, rolling supervision, able to transition sidelying to sit with HHA; 12/21: min-mod A    Time 12    Period Weeks  Status Partially Met    Target Date 04/19/20      PT LONG TERM GOAL #2   Title Patient will complete sit <> stand transfer chair to chair with Min A and LRAD to improve functional independence for household and community mobility.     Baseline min A for STS, still requires elevated surface height to 27"    Time 12    Period Weeks    Status Partially Met    Target Date 04/19/20      PT LONG TERM GOAL #3   Title Patient will navigate power w/c with min A x 100 feet to improve mobility for household and short community distances.    Baseline required total A (06/12/2018); able to roll 20 feet with min A (06/20/2018); 07/08/18: Pt can go approximately 10' without assist per family;  10/23/2018: pt's daughter states that his power WC mobility has become 100% better with the new hand control, slowed speed, and with practice; 10/12: pt's daughter reports that he can perform household WC mobility and limited community ambulation with minA    Time 12    Period Weeks    Status Achieved    Target Date 04/19/20      PT LONG TERM GOAL #4   Title Patient will complete car transfers W/C to family SUV with min A using LRAD to improve his abilty to participate in community activities.     Baseline unable to complete car transfers at all (06/12/2018); unable to complete car transfers at all (06/19/2018).; 07/08/18: Unable to perform at this time; 09/11/18: unable to perform at this time; 10/23/2018: unable to complete car transfers at all; 10/12:  unable to complete car transfers at all, 10/29: unable; 01/13/19:  unable; 2/11: min-mod A +2, 3/10: min A+1, 12/21: mod A    Time 12    Period Weeks    Status Deferred    Target Date 04/19/20      PT LONG TERM GOAL #5   Title Patient will ambulate at least 10 feet with min A using LRAD to improve mobility for household and community distances.    Baseline unable to take steps (06/12/2018); able to shift R leg forward and back with max A and RW at edge of plinth (06/19/2018); 07/08/18: unable to ambulate at this time. 09/11/18: unable to perform at this time; 10/23/2018: unable to perform at this time; 10/12: unable to perform at this time, 10/29: unable at this time; 01/13/19: unable at this time, 2/11: min A to step with RLE, unable to step with LLE, 3/10: able to take 2 steps in parallel bars with min A for safety; 4/1: min A for walking 10 feet in parallel bars, 5/24: min A walking 10 feet in parallel bars; 6/21: no change 7/14: no change    Time 12    Period Weeks    Status Deferred    Target Date 04/19/20      PT LONG TERM GOAL #6   Title Patient will be able to safety navigate up/down ramp with power chair, exhibiting good safety awareness, mod I to safely enter/exit his home.    Baseline 2/11: supervision with intermittent min A;4/1: supervision; 5/24: supervision    Time 12    Period Weeks    Status Partially Met    Target Date 04/19/20      PT LONG TERM GOAL #7   Title Patient will increase functional reach test to >15 inches in sitting to exhibit improved sitting balance  and positioning and reduce fall risk;    Baseline 07/30/18: 12 inches; 09/11/18: 10-12 inches; 10/23/18: 12-13 inch; 10/12: 14.5", 10/29: 15.5 inch    Time 4    Period Weeks    Status Achieved    Target Date 04/19/20      PT LONG TERM GOAL #8   Title Patient will increase LLE quad strength to 3+/5 to improve functional strength for standing and mobility;    Baseline 10/28: 2+/5; 01/13/19: 2+/5, 2/11: 3-/5, 04/16/19: 3-/5, 05/08/19: 3-/5, 5/24: 3-/5, 6/21: 3-/5, 7/14: 3-/5, 12/21: 3-/5     Time 12    Period Weeks    Status Partially Met    Target Date 04/19/20      PT LONG TERM GOAL  #9   TITLE Patient will report no vertigo with provoking motions or positions in order to be able to perform ADLs and mobility tasks without symptoms.    Baseline 06/30/19: reports no dizziness in last week; 6/21: one episode this weekend; 7/14: experiencing one a week;    Time 12    Period Weeks    Status Partially Met    Target Date 04/19/20                 Plan - 02/19/20 1655    Clinical Impression Statement Patient demonstrated improved overall standing ability today as seen by increased time with more right LE quad control and responsive to verbal and tactile cues for trunk/hip ext with less physical assistance. Patient will continue to benefit from ongoing skilled PT services to progress strength/balance and mobility.    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    Examination-Activity Limitations Bed Mobility;Bend;Sit;Toileting;Stand;Stairs;Lift;Transfers;Squat;Locomotion Level;Carry;Dressing;Hygiene/Grooming;Continence    Examination-Participation Restrictions Yard Work;Interpersonal Relationship;Community Activity    Stability/Clinical Decision Making Evolving/Moderate complexity    Rehab Potential Fair    PT Frequency 3x / week    PT Duration 12 weeks    PT Treatment/Interventions ADLs/Self Care Home Management;Electrical Stimulation;Moist  Heat;Cryotherapy;Gait training;Stair training;Functional mobility training;Therapeutic exercise;Balance training;Neuromuscular re-education;Cognitive remediation;Patient/family education;Orthotic Fit/Training;Wheelchair mobility training;Manual techniques;Passive range of motion;Energy conservation;Joint Manipulations;Canalith Repostioning;Therapeutic activities;Vestibular    PT Next Visit Plan Work on strength, sitting/standing balance, functional activities such as rolling, bed mobility, transfers; caregiver training    PT Home Exercise Plan no updates this session    Consulted and Agree with Plan of Care Patient;Family member/caregiver    Family Member Consulted DTR           Patient will benefit from skilled therapeutic intervention in order to improve the following deficits and impairments:  Abnormal gait,Decreased activity tolerance,Decreased cognition,Decreased endurance,Decreased knowledge of use of DME,Decreased range of motion,Decreased skin integrity,Decreased strength,Impaired perceived functional ability,Impaired sensation,Impaired UE functional use,Improper body mechanics,Pain,Cardiopulmonary status limiting activity,Decreased balance,Decreased coordination,Decreased mobility,Difficulty walking,Impaired tone,Postural dysfunction,Dizziness  Visit Diagnosis: Muscle weakness (generalized)  Other lack of coordination  Difficulty in walking, not elsewhere classified  Hemiplegia and hemiparesis following cerebral infarction affecting left non-dominant side ([place])  Repeated falls     Problem List Patient Active Problem List   Diagnosis Date Noted  . Sepsis ([place]) 10/18/2018   Sande Brothers, [place]  Bridney Guadarrama PT, DPT 02/19/2020, 5:04 PM  [place] MAIN [facility] SERVICES [address] [place], [place], [address] Phone: [phone]   Fax:  [phone]  Name: DIESEL LINA MRN: [id number] Date of Birth: [birth year]

## 2020-02-19 NOTE — ED Provider Notes (Signed)
[facility] Emergency Department Provider Note  Time seen: 6:36 PM  I have reviewed the triage vital signs and the nursing notes.   HISTORY  Chief Complaint Seizures and Dizziness   HPI Kenneth Summers is a 75 y.o. male with a past medical history of CAD, CKD, gastric reflux, hypertension, hyperlipidemia, rheumatoid arthritis, seizure disorder on Lamictal presents to the emergency department after an episode of unresponsiveness. According to the daughter who is here with the patient he just finished up his PT and OT which he has been doing for some time now at the hospital. States while waiting he had a brief episode where his face tightened up and he went unresponsive. Daughter states this has been occurring chronically, he is taking seizure medication per daughter for this. Patient states last episode occurred approximately 3 days ago with a similar event. Patient states he feels back to normal did have a headache following the event which has since resolved. Denies any vomiting or diarrhea. Daughter states he did grab his chest earlier today but the patient denied any chest pain at any point. No shortness of breath. No fever or cough. Patient is in a powered wheelchair.  Past Medical History:  Diagnosis Date  . Actinic keratosis   . Alcohol abuse   . APS (antiphospholipid syndrome) ([place])   . Basal cell carcinoma   . BPH (benign prostatic hyperplasia)   . CAD (coronary artery disease)   . Cellulitis   . Cervical spinal stenosis   . Chronic osteomyelitis ([place])   . CKD (chronic kidney disease)   . CKD (chronic kidney disease)   . Clostridium difficile diarrhea   . Depression   . Diplopia   . Fatigue   . GERD (gastroesophageal reflux disease)   . Hyperlipemia   . Hypertension   . Hypovitaminosis D   . IBS (irritable bowel syndrome)   . 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 Insomnia   . Left lumbosacral radiculopathy   . Moderate obstructive sleep apnea   .  Osteomyelitis of foot ([place])   . PAD (peripheral artery disease) ([place])   . Pruritus   . RA (rheumatoid arthritis) ([place])   . Radiculopathy   . Restless legs syndrome   . RLS (restless legs syndrome)   . SLE (systemic lupus erythematosus related syndrome) ([place])   . 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 Stroke ([place])   . Toxic maculopathy of both eyes     Patient Active Problem List   Diagnosis Date Noted  . Sepsis ([place]) 10/18/2018    Past Surgical History:  Procedure Laterality Date  . CARDIAC CATHETERIZATION    . CAROTID ENDARTERECTOMY    . CERVICAL LAMINECTOMY    . CORONARY ANGIOPLASTY    . FRACTURE SURGERY    . fusion C5-6-7    . HERNIA REPAIR    . KNEE ARTHROSCOPY    . TONSILLECTOMY      Prior to Admission medications   Medication Sig Start Date End Date Taking? Authorizing Provider  lamoTRIgine (LAMICTAL) 25 MG tablet Take 25 mg by mouth 2 (two) times daily.   Yes [provider]  aspirin EC 81 MG tablet Take by mouth daily.    [provider]  atorvastatin (LIPITOR) 80 MG tablet Take 80 mg by mouth Nightly.     [provider]  FLUoxetine (PROZAC) 40 MG capsule Take 40 mg by mouth daily.    [provider]  gabapentin (NEURONTIN) 400 MG capsule Take 400 mg by mouth at bedtime.     [provider]  hydrocortisone 2.5 % cream Apply to affected areas face 1-2 times daily as directed. 05/20/19   Brendolyn Patty, MD  ketoconazole (NIZORAL) 2 % cream Apply to affected areas face 1-2 times daily as directed. 05/20/19   Brendolyn Patty, MD  losartan (COZAAR) 25 MG tablet Take 25 mg by mouth daily.    [provider]  Melatonin 3 MG TABS Take 6 mg by mouth at bedtime.     [provider]  montelukast (SINGULAIR) 10 MG tablet Take 10 mg by mouth daily.    [provider]  predniSONE (DELTASONE) 5 MG tablet Take 5 mg by mouth daily with breakfast.    [provider]   QUEtiapine (SEROQUEL) 25 MG tablet Take 25-50 mg at night 10/15/18   [provider]  rOPINIRole (REQUIP) 2 MG tablet Take 2 mg by mouth at bedtime as needed.     [provider]  warfarin (COUMADIN) 2.5 MG tablet Take 2.5 mg by mouth 3 (three) times a week. Jory Sims, Ludwig Clarks 08/29/18   [provider]  warfarin (COUMADIN) 5 MG tablet Take 5 mg by mouth 4 (four) times a week. Tues, thurs,sat sun    [provider]    Allergies  Allergen Reactions  . Hydroxychloroquine Other (See Comments)    Other reaction(s): Other (See Comments)  Causing blindness Visual problems     Family History  Problem Relation Age of Onset  . COPD Mother   . Stroke Father     Social History Social History   Tobacco Use  . Smoking status: Former Smoker    Packs/day: 1.00    Years: 20.00    Pack years: 20.00    Types: Cigarettes  . Smokeless tobacco: Never Used  Substance Use Topics  . Alcohol use: Yes    Comment: Socially  . Drug use: No    Review of Systems Constitutional: Negative for fever. Cardiovascular: Negative for chest pain. Respiratory: Negative for shortness of breath. Gastrointestinal: Negative for abdominal pain, vomiting and diarrhea. Musculoskeletal: Negative for musculoskeletal complaints Neurological: Mild headache after the event which has since resolved All other ROS negative  ____________________________________________   PHYSICAL EXAM:  VITAL SIGNS: ED Triage Vitals  Enc Vitals Group     BP 02/19/20 1512 125/73     Pulse Rate 02/19/20 1512 77     Resp 02/19/20 1512 18     Temp 02/19/20 1512 97.6 F (36.4 C)     Temp Source 02/19/20 1512 Oral     SpO2 02/19/20 1512 95 %     Weight 02/19/20 1513 248 lb (112.5 kg)     Height 02/19/20 1513 6\' 5"  (1.956 m)     Head Circumference --      Peak Flow --      Pain Score 02/19/20 1512 0     Pain Loc --      Pain Edu? --      Excl. in [place]? --    Constitutional: Patient is awake and  alert. No acute distress. Has no complaints at this time. Eyes: Normal exam ENT      Head: Normocephalic and atraumatic.      Mouth/Throat: Mucous membranes are moist. Cardiovascular: Normal rate, regular rhythm. No murmurs, rubs, or gallops. Respiratory: Normal respiratory effort without tachypnea nor retractions. Breath sounds are clear Gastrointestinal: Soft and nontender. No distention.  Musculoskeletal: Nontender with normal range of motion in all extremities. No edema or tenderness. Neurologic:  Normal speech and language. No gross focal neurologic deficits  Skin:  Skin is warm, dry and intact.  Psychiatric: Mood and affect are normal.   ____________________________________________    EKG  EKG viewed and interpreted by myself shows a normal sinus rhythm at 60 bpm with a widened QRS, left axis deviation, largely normal intervals with nonspecific ST changes.  ____________________________________________    RADIOLOGY  CT head negative for acute abnormality.  ____________________________________________   INITIAL IMPRESSION / ASSESSMENT AND PLAN / ED COURSE  Pertinent labs & imaging results that were available during my care of the patient were reviewed by me and considered in my medical decision making (see chart for details).   Patient presents to the emergency department after an episode of unresponsiveness at the hospital. Shortly after PT/OT appointment patient briefly went unresponsive and had tightening of his facial muscles per daughter. Daughter states this has been occurring over the past year to has seen neurology and was diagnosed with a seizure disorder. Currently taking Lamictal. Daughter states last episodes approximately 3 days ago. Currently the patient appears well. She does state the frequency of events have been increasing somewhat recently. [name] only complaint was a headache after the event which is since resolved. Patient appears well. Daughter states  earlier today the patient did grab his left chest but when she asked he denied any chest pain. Patient denies any chest pain currently either. We will add on a troponin as a precaution. Remainder the lab work is largely within normal limits. We will also send a COVID swab as precaution.  COVID test has resulted negative.  [name] troponin is largely unchanged from initial troponin.  Patient will be discharged home.  Patient and daughter agreeable to plan of care  LATONYA KNIGHT was evaluated in Emergency Department on 02/19/2020 for the symptoms described in the history of present illness. He was evaluated in the context of the global COVID-19 pandemic, which necessitated consideration that the patient might be at risk for infection with the SARS-CoV-2 virus that causes COVID-19. Institutional protocols and algorithms that pertain to the evaluation of patients at risk for COVID-19 are in a state of rapid change based on information released by regulatory bodies including the CDC and federal and state organizations. These policies and algorithms were followed during the patient's care in the ED.  ____________________________________________   FINAL CLINICAL IMPRESSION(S) / ED DIAGNOSES  Seizure activity   Harvest Dark, MD 02/19/20 2107

## 2020-02-19 NOTE — ED Triage Notes (Signed)
See first nurse note. Pt now able to talk and answer questions. Hx of seizures-takes seizure med.

## 2020-02-20 LAB — LAMOTRIGINE LEVEL: Lamotrigine Lvl: <1 ug/mL — ABNORMAL LOW (ref 2.0–20.0)

## 2020-02-21 ENCOUNTER — Encounter: Payer: Self-pay | Admitting: Occupational Therapy

## 2020-02-21 NOTE — Therapy (Signed)
[place] [facility] SERVICES [address] [place], [place], [address] Phone: [phone]   Fax:  [phone]  Occupational Therapy Treatment  Patient Details  Name: Kenneth Summers MRN: [id number] Date of Birth: September 08, 1945 No data recorded  Encounter Date: 02/19/2020   OT End of Session - 02/21/20 1053    Visit Number [id number]    Number of Visits 181    Date for OT Re-Evaluation 03/01/20    Authorization Type Progress report period starting 01/26/2020    OT Start Time 1300    OT Stop Time 1345    OT Time Calculation (min) 45 min    Activity Tolerance Patient tolerated treatment well    Behavior During Therapy [facility] for tasks assessed/performed           Past Medical History:  Diagnosis Date  . Actinic keratosis   . Alcohol abuse   . APS (antiphospholipid syndrome) ([place])   . Basal cell carcinoma   . BPH (benign prostatic hyperplasia)   . CAD (coronary artery disease)   . Cellulitis   . Cervical spinal stenosis   . Chronic osteomyelitis ([place])   . CKD (chronic kidney disease)   . CKD (chronic kidney disease)   . Clostridium difficile diarrhea   . Depression   . Diplopia   . Fatigue   . GERD (gastroesophageal reflux disease)   . Hyperlipemia   . Hypertension   . Hypovitaminosis D   . IBS (irritable bowel syndrome)   . 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 Insomnia   . Left lumbosacral radiculopathy   . Moderate obstructive sleep apnea   . Osteomyelitis of foot ([place])   . PAD (peripheral artery disease) ([place])   . Pruritus   . RA (rheumatoid arthritis) ([place])   . Radiculopathy   . Restless legs syndrome   . RLS (restless legs syndrome)   . SLE (systemic lupus erythematosus related syndrome) ([place])   . 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 Stroke ([place])   . Toxic maculopathy of both eyes     Past Surgical History:  Procedure Laterality Date  . CARDIAC CATHETERIZATION    . CAROTID ENDARTERECTOMY    .  CERVICAL LAMINECTOMY    . CORONARY ANGIOPLASTY    . FRACTURE SURGERY    . fusion C5-6-7    . HERNIA REPAIR    . KNEE ARTHROSCOPY    . TONSILLECTOMY      There were no vitals filed for this visit.   Subjective Assessment - 02/21/20 1051    Subjective  Patient reports he is doing well.  Would like to work on strengthening today.    Pertinent History Pt. is a 75 y.o. 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 Pt. PMHx includes: Multiple Falls, Lupus, DJD, Renal Abscess, CVA. Pt. resides with his wife. Pt.'s wife and daughter assist with ADLs. Pt. has caregivers in  for 2 hours a day, 6 days a week. Pt. received Rehab services in acute care, at SNF for STR, and [place] services. Pt. is retired from [employer] for [employer] and [employer].    Patient Stated Goals To regain use of his left UE, and do more for himself.    Currently in Pain? No/denies    Pain Score 0-No pain          Therapeutic Exercise:  Patient seen for strengthening and ROM exercises with  use of 2# weight right wrist and  1# left wrist.  Shoulder flexion, chest press, elbow flexion/extension, ABD, diagonal patterns.  2 sets of 10 reps each.  Fatigues quickly with use of weights.  Removed weights for additional reaching exercises from seated position.  Cues for attempts at weight shifting with reach.  Reaching with SAEBO tower with use of looped balls working towards 4 levels of reach.  Increased difficulty with left UE than right.  Multiple trials completed. Therapist guiding as needed when pt fatigues.    Response to tx: Patient continues to perform well in therapy with ROM and strengthening of bilateral UEs.  Patient requires cues at times for normal movement patterns with reaching.  Right UE stronger than left.  Requires guiding at times as well as rest breaks during task.  Continue OT to maximize safety and independence in necessary daily tasks.                        OT Education - 02/21/20 1052    Education Details BUE ther. ex    Person(s) Educated Patient    Methods Explanation;Demonstration    Comprehension Returned demonstration;Tactile cues required;Verbalized understanding;Need further instruction               OT Long Term Goals - 01/22/20 1329      OT LONG TERM GOAL #1   Title Pt. will increase left shoulder flexion AROM by 10 degrees to access cabinet/shelf.    Baseline Pt. continues to progress with left shoulder ROM .Shouler flexion 130, and continues to work on reaching with increasing emphasis on flexing trunk to improve functional reach.  07/21/19- shoulder flexion to 130 with effort    Time 12    Period Weeks    Status Not Met    Target Date 03/01/20      OT LONG TERM GOAL #2   Title Pt. will donn a shirt with Supervision.    Baseline Pt. continues to require modA donning a zip down jacket with consistent cues to initiate the correct direction of the jacket.  Min-modA mahaging the zipper. MinA for donning a T-shirt with assist to roll side to side to pull the shirt down over his back. 07/21/19 continues to require min assist at times for shirt/jacket    Time 12    Period Weeks    Status On-going    Target Date 03/01/20      OT LONG TERM GOAL #4   Title Pt. will improve left grip strength by 10# to be able to open a jar/container.    Baseline Pt. is making stedy progress with grip strength. Pt. is having a herder time opening a wide mouthed jar, however is able to open a smaller jar easier.    Time 12    Period Weeks    Status On-going    Target Date 03/02/19      OT LONG TERM GOAL #5   Title Pt. will improve left hand [facility] skills to be able to assist with buttoning/zipping.    Baseline Pt. has improved overall donning a jacket, and  manipulating the zipper on his jacket, however continues to require practice depending on the type of zipper jacket. Pt. continues to work on improving  bilateral [facility] skills for buttoning/zipping.  07/21/19 improving with buttons and zippers.    Time 12    Period Weeks    Status On-going    Target Date 03/01/20  OT LONG TERM GOAL #6   Title Pt. will demonstrate visual conmpensatory strategies for 100% of the time during ADLs    Baseline Pt.continues to require intermittent cues cues for left sides awareness.    Time 12    Period Weeks    Status Partially Met    Target Date 03/01/20      OT LONG TERM GOAL #8   Title Pt. will accurately identify potential safety hazard using good safety awareness, and judgement 100% for ADLs, and IADLs.    Baseline Pt. continues to require cues    Time 12    Period Weeks    Status On-going    Target Date 03/01/20      OT LONG TERM GOAL  #9   TITLE Pt. will  navigate his w/c around obstacles with Supervision and 100% accuracy    Baseline Pt. continues to require cuing for obstacles on the left, however requires less cues overall. Pt. requires significant cues to engage his left UE during tasks. 07/21/2019 continues to improve with decreased assist at times    Time 12    Period Weeks    Status On-going    Target Date 03/01/20      OT LONG TERM GOAL  #10   TITLE Pt. will independently be able  to reach to place items into cabinetry, and closets.    Baseline Pt. continues to have difficulty with functional reaching conitnues to be limited.    Time 12    Period Weeks    Status On-going    Target Date 03/01/20      OT LONG TERM GOAL  #11   TITLE Pt. will increase BUE strength by 40mm grades in preparation for ADL transfers.    Baseline Pt. BUE strength conitnues to be limited. Pt. is able to tolerate increased resistive weights. Pt. continues to work towards improving overall strength for ADLs.    Time 12    Period Weeks    Status On-going    Target Date 03/01/20      OT LONG TERM GOAL  #12   TITLE Pt. will improve LUE functional reaching with the left engaging the trunk 100% of the time  during ADL tasks with minimal cues.    Baseline Pt. continues to improve with LUE functional reaching. Fewer cues are required to engage his trunk for extended reaching.    Time 12    Period Weeks    Status On-going    Target Date 03/01/20                 Plan - 02/21/20 1053    Clinical Impression Statement Patient continues to perform well in therapy with ROM and strengthening of bilateral UEs.  Patient requires cues at times for normal movement patterns with reaching.  Right UE stronger than left.  Requires guiding at times as well as rest breaks during task.  Continue OT to maximize safety and independence in necessary daily tasks.    OT Occupational Profile and History Problem Focused Assessment - Including review of records relating to presenting problem    Occupational performance deficits (Please refer to evaluation for details): ADL's    Body Structure / Function / Physical Skills UE functional use;Coordination;FMC;Dexterity;Strength;ROM    Cognitive Skills Attention;Memory;Emotional;Problem Solve;Safety Awareness    Rehab Potential Good    Clinical Decision Making Several treatment options, min-mod task modification necessary    Comorbidities Affecting Occupational Performance: Presence of comorbidities impacting occupational performance  Comorbidities impacting occupational performance description: Phyical, cognitive, visual,  medical comorbidities    Modification or Assistance to Complete Evaluation  No modification of tasks or assist necessary to complete eval    OT Frequency 2x / week    OT Duration 12 weeks    OT Treatment/Interventions Self-care/ADL training;DME and/or AE instruction;Therapeutic exercise;Therapeutic activities;Moist Heat;Cognitive remediation/compensation;Neuromuscular education;Visual/perceptual remediation/compensation;Coping strategies training;Patient/family education;Passive range of motion;Psychosocial skills training;Energy  conservation;Functional Mobility Training    Consulted and Agree with Plan of Care Patient;Family member/caregiver    Family Member Consulted daughter [name]           Patient will benefit from skilled therapeutic intervention in order to improve the following deficits and impairments:   Body Structure / Function / Physical Skills: UE functional use,Coordination,FMC,Dexterity,Strength,ROM Cognitive Skills: Attention,Memory,Emotional,Problem Solve,Safety Awareness     Visit Diagnosis: Muscle weakness (generalized)  Other lack of coordination  Hemiplegia and hemiparesis following cerebral infarction affecting left non-dominant side [facility])    Problem List Patient Active Problem List   Diagnosis Date Noted  . Sepsis ([place]) 10/18/2018   Psalm Schappell T Lashanta Elbe, OTR/L, CLT  Leylany Nored 02/21/2020, 11:02 AM  [place] [facility] SERVICES [address] [place], [place], [address] Phone: [phone]   Fax:  [phone]  Name: Kenneth Summers MRN: [id number] Date of Birth: [birth year]

## 2020-02-23 ENCOUNTER — Ambulatory Visit: Payer: Medicare Other | Admitting: Physical Therapy

## 2020-02-23 ENCOUNTER — Ambulatory Visit: Payer: Medicare Other | Admitting: Occupational Therapy

## 2020-02-25 ENCOUNTER — Ambulatory Visit: Payer: Medicare Other

## 2020-02-25 ENCOUNTER — Ambulatory Visit: Payer: Medicare Other | Admitting: Occupational Therapy

## 2020-02-25 ENCOUNTER — Other Ambulatory Visit: Payer: Self-pay

## 2020-02-25 DIAGNOSIS — I69354 Hemiplegia and hemiparesis following cerebral infarction affecting left non-dominant side: Secondary | ICD-10-CM

## 2020-02-25 DIAGNOSIS — R262 Difficulty in walking, not elsewhere classified: Secondary | ICD-10-CM

## 2020-02-25 DIAGNOSIS — M6281 Muscle weakness (generalized): Secondary | ICD-10-CM | POA: Diagnosis not present

## 2020-02-25 DIAGNOSIS — R296 Repeated falls: Secondary | ICD-10-CM

## 2020-02-25 DIAGNOSIS — H547 Unspecified visual loss: Secondary | ICD-10-CM

## 2020-02-25 DIAGNOSIS — R278 Other lack of coordination: Secondary | ICD-10-CM

## 2020-02-25 DIAGNOSIS — R41841 Cognitive communication deficit: Secondary | ICD-10-CM

## 2020-02-25 NOTE — Therapy (Signed)
[place] MAIN [facility] SERVICES [address] [place], [place], [address] Phone: [phone]   Fax:  [phone]  Physical Therapy Treatment  Patient Details  Name: Kenneth Summers MRN: [id number] Date of Birth: Mar 29, 1945 No data recorded  Encounter Date: 02/25/2020   PT End of Session - 02/25/20 1421    Visit Number [id number]    Number of Visits 252    Date for PT Re-Evaluation 04/19/20    Authorization Type Medicare reporting period starting 09/11/18; NO FOTO    Authorization Time Period goals last assessed 08/20/19    PT Start Time 1255    PT Stop Time 1340    PT Time Calculation (min) 45 min    Equipment Utilized During Treatment Gait belt    Activity Tolerance No increased pain;Patient limited by fatigue;Patient limited by lethargy    Behavior During Therapy [facility] for tasks assessed/performed           Past Medical History:  Diagnosis Date  . Actinic keratosis   . Alcohol abuse   . APS (antiphospholipid syndrome) ([place])   . Basal cell carcinoma   . BPH (benign prostatic hyperplasia)   . CAD (coronary artery disease)   . Cellulitis   . Cervical spinal stenosis   . Chronic osteomyelitis ([place])   . CKD (chronic kidney disease)   . CKD (chronic kidney disease)   . Clostridium difficile diarrhea   . Depression   . Diplopia   . Fatigue   . GERD (gastroesophageal reflux disease)   . Hyperlipemia   . Hypertension   . Hypovitaminosis D   . IBS (irritable bowel syndrome)   . 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 Insomnia   . Left lumbosacral radiculopathy   . Moderate obstructive sleep apnea   . Osteomyelitis of foot ([place])   . PAD (peripheral artery disease) ([place])   . Pruritus   . RA (rheumatoid arthritis) ([place])   . Radiculopathy   . Restless legs syndrome   . RLS (restless legs syndrome)   . SLE (systemic lupus erythematosus related syndrome) ([place])   . 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 Stroke ([place])    . Toxic maculopathy of both eyes     Past Surgical History:  Procedure Laterality Date  . CARDIAC CATHETERIZATION    . CAROTID ENDARTERECTOMY    . CERVICAL LAMINECTOMY    . CORONARY ANGIOPLASTY    . FRACTURE SURGERY    . fusion C5-6-7    . HERNIA REPAIR    . KNEE ARTHROSCOPY    . TONSILLECTOMY      There were no vitals filed for this visit.   Subjective Assessment - 02/25/20 1246    Subjective The patient reports he is feeling good again today. Reports he did go to ED department without admission after last session and was told he had seizure like activity. No changes in meds. He reports he was extremely tired the next day but feeling well today.    Patient is accompained by: Family member    Pertinent History Patient is a 75 y.o. male who presents to outpatient physical therapy with a referral for medical diagnosis of CVA. This patient's chief complaints consist of left hemiplegia and overall deconditioning leading to the following functional deficits: dependent for ADLs, IADLs, unable to transfer to car, dependent transfers at home with hoyer lift, unable to walk or stand without significant assistance, difficulty with W/C navigation.Marland Kitchen  Limitations Sitting;Lifting;Standing;House hold activities;Writing;Walking;Other (comment)    Diagnostic tests Brain MRI 11/01/2017: IMPRESSION: 1. Acute right ACA territory infarct as demonstrated on prior CT imaging. No intracranial hemorrhage or significant mass effect. 2. 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    Patient Stated Goals wants to be able to walk again    Currently in Pain? No/denies    Pain Onset Today    Pain Onset Today             Therex:   Seated Scapular row with green TB- 2 sets of 12 reps bilaterally (Max VCs and moderate physical assist of caregiver to maintain upright erect posture)  -Seated abdominal crunch with mod assist x 10 reps (verbal and tactile cues as well as physical  assist to perform correctly)  -Seated toss into basketball basket using small nerf ball x 5 min- focusing on maintaining upright posture. Patient required min/mod physical assist to maintain posture and only able to hold himself up for less than 30 sec.  AROM- Seated hip march, seated knee ext- assist on left LE with march x 15 reps each.   Neuromuscular re-education:  Static standing in Parallel bars:  (Max assist to stand with patient assisting approx 25% effort to stand) 3 trials of standing: 1) 35 sec 2) 20sec 3) attempted yet patient unable to stand despite Max assist due to fatigue/LE muscle weakness. Patient required increased rest breaks between sessions Patient unable to achieve erect posture today - unable to extend hips without increased physical assist.  Patient required max assist of 1 to maintain standing using gait belt, blocking knees, and UE Support on bars.   Treatment stopped due to patient fatigue level.                                          PT Education - 02/25/20 1413    Education Details Education on postural control, cues for all technique with standing and exercises today.    Person(s) Educated Patient;Caregiver(s)    Methods Explanation;Demonstration;Tactile cues;Verbal cues    Comprehension Verbalized understanding;Tactile cues required;Need further instruction;Returned demonstration;Verbal cues required            PT Short Term Goals - 01/26/20 1308      PT SHORT TERM GOAL #1   Title Be independent with initial home exercise program for self-management of symptoms.    Baseline HEP to be given at second session; advice to practice unsupported sitting at home (06/19/2018); 07/08/18: Performing at home, 6/29: needs assistance but is doing exercise program regularly; 08/22/18: adherent;    Time 2    Period Weeks    Status Achieved    Target Date 06/26/18             PT Long Term Goals - 01/26/20 1308      PT LONG  TERM GOAL #1   Title Patient will complete all bed mobility with min A to improve functional independence for getting in and out of bed and adjusting in bed.     Baseline max A - total A reported by family (06/19/2018); 07/08/18: still requires maxA, dtr reports improvement in rolling since starting therapy; 6/23: min A to supervision in clinic; not doing at home right; 8/5: Pt's daughter states that his rolling has improved, but still has difficulty with all other bed mobility; 10/23/18: pts daughter states that  she is doing about 35% of the work if he is in proper position, he does uses the bed rails to help roll;  using Harrel Lemon for coming to sit EOB; 10/12: pts daughter states that she is doing about 30% of the work if he is in proper position, he does uses the bed rails to help roll, but it is improving;  using Harrel Lemon for coming to sit EOB, 10/29: min A for sit to sidelying, rolling supervision, able to transition sidelying to sit with HHA; 12/21: min-mod A    Time 12    Period Weeks    Status Partially Met    Target Date 04/19/20      PT LONG TERM GOAL #2   Title Patient will complete sit <> stand transfer chair to chair with Min A and LRAD to improve functional independence for household and community mobility.     Baseline min A for STS, still requires elevated surface height to 27"    Time 12    Period Weeks    Status Partially Met    Target Date 04/19/20      PT LONG TERM GOAL #3   Title Patient will navigate power w/c with min A x 100 feet to improve mobility for household and short community distances.    Baseline required total A (06/12/2018); able to roll 20 feet with min A (06/20/2018); 07/08/18: Pt can go approximately 10' without assist per family;  10/23/2018: pt's daughter states that his power WC mobility has become 100% better with the new hand control, slowed speed, and with practice; 10/12: pt's daughter reports that he can perform household WC mobility and limited community ambulation  with minA    Time 12    Period Weeks    Status Achieved    Target Date 04/19/20      PT LONG TERM GOAL #4   Title Patient will complete car transfers W/C to family SUV with min A using LRAD to improve his abilty to participate in community activities.     Baseline unable to complete car transfers at all (06/12/2018); unable to complete car transfers at all (06/19/2018).; 07/08/18: Unable to perform at this time; 09/11/18: unable to perform at this time; 10/23/2018: unable to complete car transfers at all; 10/12:  unable to complete car transfers at all, 10/29: unable; 01/13/19: unable; 2/11: min-mod A +2, 3/10: min A+1, 12/21: mod A    Time 12    Period Weeks    Status Deferred    Target Date 04/19/20      PT LONG TERM GOAL #5   Title Patient will ambulate at least 10 feet with min A using LRAD to improve mobility for household and community distances.    Baseline unable to take steps (06/12/2018); able to shift R leg forward and back with max A and RW at edge of plinth (06/19/2018); 07/08/18: unable to ambulate at this time. 09/11/18: unable to perform at this time; 10/23/2018: unable to perform at this time; 10/12: unable to perform at this time, 10/29: unable at this time; 01/13/19: unable at this time, 2/11: min A to step with RLE, unable to step with LLE, 3/10: able to take 2 steps in parallel bars with min A for safety; 4/1: min A for walking 10 feet in parallel bars, 5/24: min A walking 10 feet in parallel bars; 6/21: no change 7/14: no change    Time 12    Period Weeks    Status Deferred  Target Date 04/19/20      PT LONG TERM GOAL #6   Title Patient will be able to safety navigate up/down ramp with power chair, exhibiting good safety awareness, mod I to safely enter/exit his home.    Baseline 2/11: supervision with intermittent min A;4/1: supervision; 5/24: supervision    Time 12    Period Weeks    Status Partially Met    Target Date 04/19/20      PT LONG TERM GOAL #7   Title Patient will  increase functional reach test to >15 inches in sitting to exhibit improved sitting balance and positioning and reduce fall risk;    Baseline 07/30/18: 12 inches; 09/11/18: 10-12 inches; 10/23/18: 12-13 inch; 10/12: 14.5", 10/29: 15.5 inch    Time 4    Period Weeks    Status Achieved    Target Date 04/19/20      PT LONG TERM GOAL #8   Title Patient will increase LLE quad strength to 3+/5 to improve functional strength for standing and mobility;    Baseline 10/28: 2+/5; 01/13/19: 2+/5, 2/11: 3-/5, 04/16/19: 3-/5, 05/08/19: 3-/5, 5/24: 3-/5, 6/21: 3-/5, 7/14: 3-/5, 12/21: 3-/5    Time 12    Period Weeks    Status Partially Met    Target Date 04/19/20      PT LONG TERM GOAL  #9   TITLE Patient will report no vertigo with provoking motions or positions in order to be able to perform ADLs and mobility tasks without symptoms.    Baseline 06/30/19: reports no dizziness in last week; 6/21: one episode this weekend; 7/14: experiencing one a week;    Time 12    Period Weeks    Status Partially Met    Target Date 04/19/20                 Plan - 02/25/20 1414    Clinical Impression Statement Patitent presented today with increased difficulty with all physical activities including increased physical assistance with transfers and static standing as well as more physical assist with postural activities. Patient unstable to stand today without max assist of PT and unable to maintain his posture once in standing as well as previous visits last week. He also required more cues to perform therex correctly than in previous visits as well.Patient will continue to benefit from skilled PT services to continue to focus on functional mobility treatment and improve his quality of life with decreased risk of falling.    Personal Factors and Comorbidities Age;Comorbidity 3+    Comorbidities Relevant past medical history and comorbidities include long term steroid use for lupus, CKD, chronic osteomyelitis, cervical  spine stenosis, BPH, APS, alcohol abuse, peripheral artery disease, depression, GERD, obstructive sleep apnea, HTN, IBS, IgA deficiency, lumbar radiculopathy, RA, Stroke, toxic maculopathy in both eyes, systemic lupus erythematosus related syndrome, cardiac catheterization, carotid endarterectomy, cervical laminectomy, coronary angioplasty, Fusion C5-C7, knee arthroplasty, lumbar surgery.    Examination-Activity Limitations Bed Mobility;Bend;Sit;Toileting;Stand;Stairs;Lift;Transfers;Squat;Locomotion Level;Carry;Dressing;Hygiene/Grooming;Continence    Examination-Participation Restrictions Yard Work;Interpersonal Relationship;Community Activity    Stability/Clinical Decision Making Evolving/Moderate complexity    Rehab Potential Fair    PT Frequency 3x / week    PT Duration 12 weeks    PT Treatment/Interventions ADLs/Self Care Home Management;Electrical Stimulation;Moist Heat;Cryotherapy;Gait training;Stair training;Functional mobility training;Therapeutic exercise;Balance training;Neuromuscular re-education;Cognitive remediation;Patient/family education;Orthotic Fit/Training;Wheelchair mobility training;Manual techniques;Passive range of motion;Energy conservation;Joint Manipulations;Canalith Repostioning;Therapeutic activities;Vestibular    PT Next Visit Plan Work on strength, sitting/standing balance, functional activities such as rolling, bed mobility, transfers; caregiver training  PT Home Exercise Plan no updates this session    Consulted and Agree with Plan of Care Patient;Family member/caregiver           Patient will benefit from skilled therapeutic intervention in order to improve the following deficits and impairments:  Abnormal gait,Decreased activity tolerance,Decreased cognition,Decreased endurance,Decreased knowledge of use of DME,Decreased range of motion,Decreased skin integrity,Decreased strength,Impaired perceived functional ability,Impaired sensation,Impaired UE functional  use,Improper body mechanics,Pain,Cardiopulmonary status limiting activity,Decreased balance,Decreased coordination,Decreased mobility,Difficulty walking,Impaired tone,Postural dysfunction,Dizziness  Visit Diagnosis: Muscle weakness (generalized)  Other lack of coordination  Hemiplegia and hemiparesis following cerebral infarction affecting left non-dominant side ([place])  Difficulty in walking, not elsewhere classified  Repeated falls  Vision impairment  Cognitive communication deficit     Problem List Patient Active Problem List   Diagnosis Date Noted  . Sepsis ([place]) 10/18/2018    Lewis Moccasin, PT 02/25/2020, 2:24 PM  [place] MAIN [facility] SERVICES [address] [place], [place], [address] Phone: [phone]   Fax:  [phone]  Name: Kenneth Summers MRN: [id number] Date of Birth: 1945-02-29

## 2020-02-26 ENCOUNTER — Ambulatory Visit: Payer: Medicare Other | Admitting: Occupational Therapy

## 2020-02-26 ENCOUNTER — Ambulatory Visit: Payer: Medicare Other

## 2020-02-26 DIAGNOSIS — M6281 Muscle weakness (generalized): Secondary | ICD-10-CM

## 2020-02-26 DIAGNOSIS — R278 Other lack of coordination: Secondary | ICD-10-CM

## 2020-02-26 DIAGNOSIS — I69354 Hemiplegia and hemiparesis following cerebral infarction affecting left non-dominant side: Secondary | ICD-10-CM

## 2020-02-26 DIAGNOSIS — R41841 Cognitive communication deficit: Secondary | ICD-10-CM

## 2020-02-26 DIAGNOSIS — H547 Unspecified visual loss: Secondary | ICD-10-CM

## 2020-02-26 DIAGNOSIS — R262 Difficulty in walking, not elsewhere classified: Secondary | ICD-10-CM

## 2020-02-26 DIAGNOSIS — R296 Repeated falls: Secondary | ICD-10-CM

## 2020-02-26 NOTE — Therapy (Signed)
[place] [facility] SERVICES [address] [place], [place], [address] Phone: [phone]   Fax:  [phone]  Physical Therapy Treatment  Patient Details  Name: Kenneth Summers MRN: [id number] Date of Birth: [birth year] No data recorded  Encounter Date: 02/26/2020   PT End of Session - 02/26/20 1450    Visit Number [id number]    Number of Visits 252    Date for PT Re-Evaluation 04/19/20    Authorization Type Medicare reporting period starting 09/11/18; NO FOTO    Authorization Time Period goals last assessed 08/20/19    PT Start Time 1347    PT Stop Time 1430    PT Time Calculation (min) 43 min    Equipment Utilized During Treatment Gait belt    Activity Tolerance No increased pain;Patient limited by fatigue;Patient tolerated treatment well    Behavior During Therapy [facility] for tasks assessed/performed           Past Medical History:  Diagnosis Date  . Actinic keratosis   . Alcohol abuse   . APS (antiphospholipid syndrome) ([place])   . Basal cell carcinoma   . BPH (benign prostatic hyperplasia)   . CAD (coronary artery disease)   . Cellulitis   . Cervical spinal stenosis   . Chronic osteomyelitis ([place])   . CKD (chronic kidney disease)   . CKD (chronic kidney disease)   . Clostridium difficile diarrhea   . Depression   . Diplopia   . Fatigue   . GERD (gastroesophageal reflux disease)   . Hyperlipemia   . Hypertension   . Hypovitaminosis D   . IBS (irritable bowel syndrome)   . 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 Insomnia   . Left lumbosacral radiculopathy   . Moderate obstructive sleep apnea   . Osteomyelitis of foot ([place])   . PAD (peripheral artery disease) ([place])   . Pruritus   . RA (rheumatoid arthritis) ([place])   . Radiculopathy   . Restless legs syndrome   . RLS (restless legs syndrome)   . SLE (systemic lupus erythematosus related syndrome) ([place])   . 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 Stroke  ([place])   . Toxic maculopathy of both eyes     Past Surgical History:  Procedure Laterality Date  . CARDIAC CATHETERIZATION    . CAROTID ENDARTERECTOMY    . CERVICAL LAMINECTOMY    . CORONARY ANGIOPLASTY    . FRACTURE SURGERY    . fusion C5-6-7    . HERNIA REPAIR    . KNEE ARTHROSCOPY    . TONSILLECTOMY      There were no vitals filed for this visit.   Subjective Assessment - 02/26/20 1448    Subjective Patient reports feeling better today than yesterday. Dtr. [name] states he had a good morning and wanted to come today and eager to work.    Patient is accompained by: Family member    Pertinent History Patient is a 75 y.o. male who presents to outpatient physical therapy with a referral for medical diagnosis of CVA. This patient's chief complaints consist of left hemiplegia and overall deconditioning leading to the following functional deficits: dependent for ADLs, IADLs, unable to transfer to car, dependent transfers at home with hoyer lift, unable to walk or stand without significant assistance, difficulty with W/C navigation..    Limitations Sitting;Lifting;Standing;House hold activities;Writing;Walking;Other (comment)    Diagnostic tests Brain MRI 11/01/2017: IMPRESSION: 1. Acute right ACA territory infarct  as demonstrated on prior CT imaging. No intracranial hemorrhage or significant mass effect. 2. 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    Patient Stated Goals wants to be able to walk again    Currently in Pain? No/denies    Pain Onset Today    Pain Onset Today               Therex:   Patient performed LE warmp-up - marching in place 2 sets of 10 reps bilaterally with min A with left LE and verbal cues to concentrate and focus by looking at his leg and lifting. Bilateral LE Knee extension with min A with Left LE 2sets of 10 reps. Patient initially starts off well but requires more assist with increased reps. Hip adduction squeeze- hold  3 sec x 10 reps Hip activity- placing 2 colored 1/2 spheres on ground approx 6 in apart with instruction to tap foot on one and lift and move to the other- 10 reps on right- uable to perform on left.  Neuro- re ed:  Patient performed sit to stand with max assist (however patient performing closer to 35% effort) with each trial of standing. Able to lean forward with less overall cues than in previous session.  Standing:  Static stand in //bars= 4 trials today 1) 40 sec 2) 25 sec yet patient attempted forward step with right LE without prompting - losing balance requiring rest break 3) 81 sec 4) 65 sec  Patient continues to present with forward trunk posture and poor left quad control- however able to stand more erect with verbal cues and physical assist. Patient unable to maintain > 10 sec at a time but able to adjust with cues to remain standing. He remains at max assist once in standing using gait belt - however decreased overall support from PT today and patient performing at 35-40% effort with standing.                         PT Education - 02/26/20 1449    Education Details Postural cues with standing and seated activities.    Person(s) Educated Patient;Caregiver(s)    Methods Explanation;Demonstration;Tactile cues;Verbal cues    Comprehension Verbalized understanding;Returned demonstration;Verbal cues required;Tactile cues required;Need further instruction            PT Short Term Goals - 01/26/20 1308      PT SHORT TERM GOAL #1   Title Be independent with initial home exercise program for self-management of symptoms.    Baseline HEP to be given at second session; advice to practice unsupported sitting at home (06/19/2018); 07/08/18: Performing at home, 6/29: needs assistance but is doing exercise program regularly; 08/22/18: adherent;    Time 2    Period Weeks    Status Achieved    Target Date 06/26/18             PT Long Term Goals - 01/26/20 1308       PT LONG TERM GOAL #1   Title Patient will complete all bed mobility with min A to improve functional independence for getting in and out of bed and adjusting in bed.     Baseline max A - total A reported by family (06/19/2018); 07/08/18: still requires maxA, dtr reports improvement in rolling since starting therapy; 6/23: min A to supervision in clinic; not doing at home right; 8/5: Pt's daughter states that his rolling has improved, but still has difficulty with  all other bed mobility; 10/23/18: pts daughter states that she is doing about 35% of the work if he is in proper position, he does uses the bed rails to help roll;  using Harrel Lemon for coming to sit EOB; 10/12: pts daughter states that she is doing about 30% of the work if he is in proper position, he does uses the bed rails to help roll, but it is improving;  using Harrel Lemon for coming to sit EOB, 10/29: min A for sit to sidelying, rolling supervision, able to transition sidelying to sit with HHA; 12/21: min-mod A    Time 12    Period Weeks    Status Partially Met    Target Date 04/19/20      PT LONG TERM GOAL #2   Title Patient will complete sit <> stand transfer chair to chair with Min A and LRAD to improve functional independence for household and community mobility.     Baseline min A for STS, still requires elevated surface height to 27"    Time 12    Period Weeks    Status Partially Met    Target Date 04/19/20      PT LONG TERM GOAL #3   Title Patient will navigate power w/c with min A x 100 feet to improve mobility for household and short community distances.    Baseline required total A (06/12/2018); able to roll 20 feet with min A (06/20/2018); 07/08/18: Pt can go approximately 10' without assist per family;  10/23/2018: pt's daughter states that his power WC mobility has become 100% better with the new hand control, slowed speed, and with practice; 10/12: pt's daughter reports that he can perform household WC mobility and limited community  ambulation with minA    Time 12    Period Weeks    Status Achieved    Target Date 04/19/20      PT LONG TERM GOAL #4   Title Patient will complete car transfers W/C to family SUV with min A using LRAD to improve his abilty to participate in community activities.     Baseline unable to complete car transfers at all (06/12/2018); unable to complete car transfers at all (06/19/2018).; 07/08/18: Unable to perform at this time; 09/11/18: unable to perform at this time; 10/23/2018: unable to complete car transfers at all; 10/12:  unable to complete car transfers at all, 10/29: unable; 01/13/19: unable; 2/11: min-mod A +2, 3/10: min A+1, 12/21: mod A    Time 12    Period Weeks    Status Deferred    Target Date 04/19/20      PT LONG TERM GOAL #5   Title Patient will ambulate at least 10 feet with min A using LRAD to improve mobility for household and community distances.    Baseline unable to take steps (06/12/2018); able to shift R leg forward and back with max A and RW at edge of plinth (06/19/2018); 07/08/18: unable to ambulate at this time. 09/11/18: unable to perform at this time; 10/23/2018: unable to perform at this time; 10/12: unable to perform at this time, 10/29: unable at this time; 01/13/19: unable at this time, 2/11: min A to step with RLE, unable to step with LLE, 3/10: able to take 2 steps in parallel bars with min A for safety; 4/1: min A for walking 10 feet in parallel bars, 5/24: min A walking 10 feet in parallel bars; 6/21: no change 7/14: no change    Time 12  Period Weeks    Status Deferred    Target Date 04/19/20      PT LONG TERM GOAL #6   Title Patient will be able to safety navigate up/down ramp with power chair, exhibiting good safety awareness, mod I to safely enter/exit his home.    Baseline 2/11: supervision with intermittent min A;4/1: supervision; 5/24: supervision    Time 12    Period Weeks    Status Partially Met    Target Date 04/19/20      PT LONG TERM GOAL #7   Title  Patient will increase functional reach test to >15 inches in sitting to exhibit improved sitting balance and positioning and reduce fall risk;    Baseline 07/30/18: 12 inches; 09/11/18: 10-12 inches; 10/23/18: 12-13 inch; 10/12: 14.5", 10/29: 15.5 inch    Time 4    Period Weeks    Status Achieved    Target Date 04/19/20      PT LONG TERM GOAL #8   Title Patient will increase LLE quad strength to 3+/5 to improve functional strength for standing and mobility;    Baseline 10/28: 2+/5; 01/13/19: 2+/5, 2/11: 3-/5, 04/16/19: 3-/5, 05/08/19: 3-/5, 5/24: 3-/5, 6/21: 3-/5, 7/14: 3-/5, 12/21: 3-/5    Time 12    Period Weeks    Status Partially Met    Target Date 04/19/20      PT LONG TERM GOAL  #9   TITLE Patient will report no vertigo with provoking motions or positions in order to be able to perform ADLs and mobility tasks without symptoms.    Baseline 06/30/19: reports no dizziness in last week; 6/21: one episode this weekend; 7/14: experiencing one a week;    Time 12    Period Weeks    Status Partially Met    Target Date 04/19/20                 Plan - 02/26/20 1452    Clinical Impression Statement Patient presents with much improved ability to stand and remain in static standing position today versus yesterday. He required less overall physical assistance and less distraction today. He is still limited by fatigue but continues to make some gains over last couple of weeks and continues to require skilled PT services to improve postural strength and ability to stand for improved quality of life    Personal Factors and Comorbidities Age;Comorbidity 3+    Comorbidities Relevant past medical history and comorbidities include long term steroid use for lupus, CKD, chronic osteomyelitis, cervical spine stenosis, BPH, APS, alcohol abuse, peripheral artery disease, depression, GERD, obstructive sleep apnea, HTN, IBS, IgA deficiency, lumbar radiculopathy, RA, Stroke, toxic maculopathy in both eyes,  systemic lupus erythematosus related syndrome, cardiac catheterization, carotid endarterectomy, cervical laminectomy, coronary angioplasty, Fusion C5-C7, knee arthroplasty, lumbar surgery.    Examination-Activity Limitations Bed Mobility;Bend;Sit;Toileting;Stand;Stairs;Lift;Transfers;Squat;Locomotion Level;Carry;Dressing;Hygiene/Grooming;Continence    Examination-Participation Restrictions Yard Work;Interpersonal Relationship;Community Activity    Stability/Clinical Decision Making Evolving/Moderate complexity    Rehab Potential Fair    PT Frequency 3x / week    PT Duration 12 weeks    PT Treatment/Interventions ADLs/Self Care Home Management;Electrical Stimulation;Moist Heat;Cryotherapy;Gait training;Stair training;Functional mobility training;Therapeutic exercise;Balance training;Neuromuscular re-education;Cognitive remediation;Patient/family education;Orthotic Fit/Training;Wheelchair mobility training;Manual techniques;Passive range of motion;Energy conservation;Joint Manipulations;Canalith Repostioning;Therapeutic activities;Vestibular    PT Next Visit Plan Progress report next visit- reassess goals and continue with progressive LE strengthening and transfers/statice standing.    PT Home Exercise Plan no updates this session    Consulted and Agree with Plan of Care Patient;Family  member/caregiver           Patient will benefit from skilled therapeutic intervention in order to improve the following deficits and impairments:  Abnormal gait,Decreased activity tolerance,Decreased cognition,Decreased endurance,Decreased knowledge of use of DME,Decreased range of motion,Decreased skin integrity,Decreased strength,Impaired perceived functional ability,Impaired sensation,Impaired UE functional use,Improper body mechanics,Pain,Cardiopulmonary status limiting activity,Decreased balance,Decreased coordination,Decreased mobility,Difficulty walking,Impaired tone,Postural dysfunction,Dizziness  Visit  Diagnosis: Muscle weakness (generalized)  Other lack of coordination  Hemiplegia and hemiparesis following cerebral infarction affecting left non-dominant side (HCC)  Difficulty in walking, not elsewhere classified  Repeated falls  Vision impairment  Cognitive communication deficit     Problem List Patient Active Problem List   Diagnosis Date Noted  . Sepsis ([place]) 10/18/2018    Lewis Moccasin, PT 02/26/2020, 3:17 PM  [place] [facility] SERVICES [address] [place], [place], [address] Phone: [phone]   Fax:  [phone]  Name: Kenneth Summers MRN: [id number] Date of Birth: [birth year]

## 2020-02-28 ENCOUNTER — Encounter: Payer: Self-pay | Admitting: Occupational Therapy

## 2020-02-28 NOTE — Therapy (Signed)
[place] MAIN [facility] SERVICES [address] [place], [place], [address] Phone: [phone]   Fax:  [phone]  Occupational Therapy Treatment  Patient Details  Name: Kenneth Summers MRN: [id number] Date of Birth: 05-Feb-1946 No data recorded  Encounter Date: 02/26/2020   OT End of Session - 02/28/20 1745    Visit Number 166    Number of Visits 181    Date for OT Re-Evaluation 03/01/20    Authorization Type Progress report period starting 01/26/2020    OT Start Time 1300    OT Stop Time 1345    OT Time Calculation (min) 45 min    Equipment Utilized During Treatment pegboard and patterns    Activity Tolerance Patient tolerated treatment well    Behavior During Therapy [facility] for tasks assessed/performed           Past Medical History:  Diagnosis Date  . Actinic keratosis   . Alcohol abuse   . APS (antiphospholipid syndrome) ([place])   . Basal cell carcinoma   . BPH (benign prostatic hyperplasia)   . CAD (coronary artery disease)   . Cellulitis   . Cervical spinal stenosis   . Chronic osteomyelitis ([place])   . CKD (chronic kidney disease)   . CKD (chronic kidney disease)   . Clostridium difficile diarrhea   . Depression   . Diplopia   . Fatigue   . GERD (gastroesophageal reflux disease)   . Hyperlipemia   . Hypertension   . Hypovitaminosis D   . IBS (irritable bowel syndrome)   . 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 Insomnia   . Left lumbosacral radiculopathy   . Moderate obstructive sleep apnea   . Osteomyelitis of foot ([place])   . PAD (peripheral artery disease) ([place])   . Pruritus   . RA (rheumatoid arthritis) ([place])   . Radiculopathy   . Restless legs syndrome   . RLS (restless legs syndrome)   . SLE (systemic lupus erythematosus related syndrome) ([place])   . 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 Stroke ([place])   . Toxic maculopathy of both eyes     Past Surgical History:  Procedure Laterality Date   . CARDIAC CATHETERIZATION    . CAROTID ENDARTERECTOMY    . CERVICAL LAMINECTOMY    . CORONARY ANGIOPLASTY    . FRACTURE SURGERY    . fusion C5-6-7    . HERNIA REPAIR    . KNEE ARTHROSCOPY    . TONSILLECTOMY      There were no vitals filed for this visit.   Subjective Assessment - 02/28/20 1744    Subjective  Pt reports he is doing well and would like to address scanning and fine motor skills.    Patient is accompanied by: Family member    Pertinent History Pt. is a 75 y.o. 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 Pt. PMHx includes: Multiple Falls, Lupus, DJD, Renal Abscess, CVA. Pt. resides with his wife. Pt.'s wife and daughter assist with ADLs. Pt. has caregivers in  for 2 hours a day, 6 days a week. Pt. received Rehab services in acute care, at SNF for STR, and [place] services. Pt. is retired from [employer] for [employer] and [employer].    Patient Stated Goals To regain use of his left UE, and do more for himself.    Currently in Pain? No/denies  OT treatment  Neur re-ed: OT engages pt in scanning tasks while also implementing portion of task to concentrate on functional reaching with R hand to complete fine motor tasks and then implementing use of L hand on closer-to-reach items to address in-hand translation as well as pincer and tripod grasp. Pt is approximately 80% successful with fxl reach with R UE, using R hand to place pegs in a pattern on higher portion of pegboard to increase trunk control and stability. Pt required MIN verbal cues for scanning/following pattern. For second portion, Pt is 55-60% successful with placing closer pegs in a pattern with L hand using in-hand manipulation/translation skills to turn the pegs all in the same direction and then utilizing pincer versus tripod grasp to obtain the peg and place into the specified space. OT engages pt in this task to increase fine motor skills and functional use of L  hand, increase scanning/pattern skills, and increase trunk stability for sitting balance. Pt tolerates session well. Will continue to follow per OT POC.                   OT Education - 02/28/20 1745    Education Details scanning, [place], importance of utilizing L UE    Person(s) Educated Patient    Methods Explanation;Demonstration    Comprehension Returned demonstration;Tactile cues required;Verbalized understanding;Need further instruction               OT Long Term Goals - 01/22/20 1329      OT LONG TERM GOAL #1   Title Pt. will increase left shoulder flexion AROM by 10 degrees to access cabinet/shelf.    Baseline Pt. continues to progress with left shoulder ROM .Shouler flexion 130, and continues to work on reaching with increasing emphasis on flexing trunk to improve functional reach.  07/21/19- shoulder flexion to 130 with effort    Time 12    Period Weeks    Status Not Met    Target Date 03/01/20      OT LONG TERM GOAL #2   Title Pt. will donn a shirt with Supervision.    Baseline Pt. continues to require modA donning a zip down jacket with consistent cues to initiate the correct direction of the jacket.  Min-modA mahaging the zipper. MinA for donning a T-shirt with assist to roll side to side to pull the shirt down over his back. 07/21/19 continues to require min assist at times for shirt/jacket    Time 12    Period Weeks    Status On-going    Target Date 03/01/20      OT LONG TERM GOAL #4   Title Pt. will improve left grip strength by 10# to be able to open a jar/container.    Baseline Pt. is making stedy progress with grip strength. Pt. is having a herder time opening a wide mouthed jar, however is able to open a smaller jar easier.    Time 12    Period Weeks    Status On-going    Target Date 03/02/19      OT LONG TERM GOAL #5   Title Pt. will improve left hand [facility] skills to be able to assist with buttoning/zipping.    Baseline Pt. has improved overall  donning a jacket, and  manipulating the zipper on his jacket, however continues to require practice depending on the type of zipper jacket. Pt. continues to work on improving bilateral [facility] skills for buttoning/zipping.  07/21/19 improving with buttons and zippers.  Time 12    Period Weeks    Status On-going    Target Date 03/01/20      OT LONG TERM GOAL #6   Title Pt. will demonstrate visual conmpensatory strategies for 100% of the time during ADLs    Baseline Pt.continues to require intermittent cues cues for left sides awareness.    Time 12    Period Weeks    Status Partially Met    Target Date 03/01/20      OT LONG TERM GOAL #8   Title Pt. will accurately identify potential safety hazard using good safety awareness, and judgement 100% for ADLs, and IADLs.    Baseline Pt. continues to require cues    Time 12    Period Weeks    Status On-going    Target Date 03/01/20      OT LONG TERM GOAL  #9   TITLE Pt. will  navigate his w/c around obstacles with Supervision and 100% accuracy    Baseline Pt. continues to require cuing for obstacles on the left, however requires less cues overall. Pt. requires significant cues to engage his left UE during tasks. 07/21/2019 continues to improve with decreased assist at times    Time 12    Period Weeks    Status On-going    Target Date 03/01/20      OT LONG TERM GOAL  #10   TITLE Pt. will independently be able  to reach to place items into cabinetry, and closets.    Baseline Pt. continues to have difficulty with functional reaching conitnues to be limited.    Time 12    Period Weeks    Status On-going    Target Date 03/01/20      OT LONG TERM GOAL  #11   TITLE Pt. will increase BUE strength by 45mm grades in preparation for ADL transfers.    Baseline Pt. BUE strength conitnues to be limited. Pt. is able to tolerate increased resistive weights. Pt. continues to work towards improving overall strength for ADLs.    Time 12    Period Weeks     Status On-going    Target Date 03/01/20      OT LONG TERM GOAL  #12   TITLE Pt. will improve LUE functional reaching with the left engaging the trunk 100% of the time during ADL tasks with minimal cues.    Baseline Pt. continues to improve with LUE functional reaching. Fewer cues are required to engage his trunk for extended reaching.    Time 12    Period Weeks    Status On-going    Target Date 03/01/20                 Plan - 02/28/20 1746    Clinical Impression Statement Pt continues to benefit from OT in the OP setting to increase visual attention and scanning as well as increase fine motor coordination and skills such as in-hand translation with both R and L and specifically pincer and tripod grasp of L hand to obtain smaller manipulatives. Pt tolerates session well and continues to progress towards higher level of INDEP with functional tasks.    OT Occupational Profile and History Problem Focused Assessment - Including review of records relating to presenting problem    Occupational performance deficits (Please refer to evaluation for details): ADL's    Body Structure / Function / Physical Skills UE functional use;Coordination;FMC;Dexterity;Strength;ROM    Cognitive Skills Attention;Memory;Emotional;Problem Solve;Safety Awareness    Rehab Potential  Good    Clinical Decision Making Several treatment options, min-mod task modification necessary    Comorbidities Affecting Occupational Performance: Presence of comorbidities impacting occupational performance    Comorbidities impacting occupational performance description: Phyical, cognitive, visual,  medical comorbidities    Modification or Assistance to Complete Evaluation  No modification of tasks or assist necessary to complete eval    OT Frequency 2x / week    OT Duration 12 weeks    OT Treatment/Interventions Self-care/ADL training;DME and/or AE instruction;Therapeutic exercise;Therapeutic activities;Moist Heat;Cognitive  remediation/compensation;Neuromuscular education;Visual/perceptual remediation/compensation;Coping strategies training;Patient/family education;Passive range of motion;Psychosocial skills training;Energy conservation;Functional Mobility Training    Plan Continue with current OT plan of care.    Consulted and Agree with Plan of Care Patient;Family member/caregiver    Family Member Consulted daughter [name]           Patient will benefit from skilled therapeutic intervention in order to improve the following deficits and impairments:   Body Structure / Function / Physical Skills: UE functional use,Coordination,FMC,Dexterity,Strength,ROM Cognitive Skills: Attention,Memory,Emotional,Problem Solve,Safety Awareness     Visit Diagnosis: Muscle weakness (generalized)  Other lack of coordination  Hemiplegia and hemiparesis following cerebral infarction affecting left non-dominant side ([place])  Vision impairment    Problem List Patient Active Problem List   Diagnosis Date Noted  . Sepsis ([place]) 10/18/2018   Gerrianne Scale, [place], OTR/L ascom [phone] 02/28/20, 5:55 PM  [place] MAIN [facility] SERVICES [address] [place], [place], [address] Phone: [phone]   Fax:  [phone]  Name: Kenneth Summers MRN: [id number] Date of Birth: July 24, 1945

## 2020-03-01 ENCOUNTER — Ambulatory Visit: Payer: Medicare Other

## 2020-03-01 ENCOUNTER — Encounter: Payer: Self-pay | Admitting: Occupational Therapy

## 2020-03-01 ENCOUNTER — Ambulatory Visit: Payer: Medicare Other | Admitting: Occupational Therapy

## 2020-03-01 ENCOUNTER — Other Ambulatory Visit: Payer: Self-pay

## 2020-03-01 DIAGNOSIS — I69354 Hemiplegia and hemiparesis following cerebral infarction affecting left non-dominant side: Secondary | ICD-10-CM

## 2020-03-01 DIAGNOSIS — R278 Other lack of coordination: Secondary | ICD-10-CM

## 2020-03-01 DIAGNOSIS — M6281 Muscle weakness (generalized): Secondary | ICD-10-CM | POA: Diagnosis not present

## 2020-03-01 DIAGNOSIS — H547 Unspecified visual loss: Secondary | ICD-10-CM

## 2020-03-01 DIAGNOSIS — R296 Repeated falls: Secondary | ICD-10-CM

## 2020-03-01 DIAGNOSIS — R262 Difficulty in walking, not elsewhere classified: Secondary | ICD-10-CM

## 2020-03-01 DIAGNOSIS — R41841 Cognitive communication deficit: Secondary | ICD-10-CM

## 2020-03-01 NOTE — Therapy (Signed)
[place] MAIN [facility] SERVICES [address] [place], [place], [address] Phone: [phone]   Fax:  [phone]  Occupational Therapy Treatment/Recertification Note  Patient Details  Name: Kenneth Summers MRN: [id number] Date of Birth: [birth year] No data recorded  Encounter Date: 03/01/2020   OT End of Session - 03/01/20 1353    Visit Number 167    Number of Visits 181    Date for OT Re-Evaluation 05/24/20    Authorization Type Progress report period starting 01/26/2020    OT Start Time 1348    OT Stop Time 1430    OT Time Calculation (min) 42 min    Activity Tolerance Patient tolerated treatment well    Behavior During Therapy [facility] for tasks assessed/performed           Past Medical History:  Diagnosis Date  . Actinic keratosis   . Alcohol abuse   . APS (antiphospholipid syndrome) ([place])   . Basal cell carcinoma   . BPH (benign prostatic hyperplasia)   . CAD (coronary artery disease)   . Cellulitis   . Cervical spinal stenosis   . Chronic osteomyelitis ([place])   . CKD (chronic kidney disease)   . CKD (chronic kidney disease)   . Clostridium difficile diarrhea   . Depression   . Diplopia   . Fatigue   . GERD (gastroesophageal reflux disease)   . Hyperlipemia   . Hypertension   . Hypovitaminosis D   . IBS (irritable bowel syndrome)   . 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 Insomnia   . Left lumbosacral radiculopathy   . Moderate obstructive sleep apnea   . Osteomyelitis of foot ([place])   . PAD (peripheral artery disease) ([place])   . Pruritus   . RA (rheumatoid arthritis) ([place])   . Radiculopathy   . Restless legs syndrome   . RLS (restless legs syndrome)   . SLE (systemic lupus erythematosus related syndrome) ([place])   . 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 Stroke ([place])   . Toxic maculopathy of both eyes     Past Surgical History:  Procedure Laterality Date  . CARDIAC CATHETERIZATION    . CAROTID  ENDARTERECTOMY    . CERVICAL LAMINECTOMY    . CORONARY ANGIOPLASTY    . FRACTURE SURGERY    . fusion C5-6-7    . HERNIA REPAIR    . KNEE ARTHROSCOPY    . TONSILLECTOMY      There were no vitals filed for this visit.   Subjective Assessment - 03/01/20 1353    Subjective  Pt. reports LUE changes beginning this morning.    Patient is accompanied by: Family member    Pertinent History Pt. is a 75 y.o. 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 Pt. PMHx includes: Multiple Falls, Lupus, DJD, Renal Abscess, CVA. Pt. resides with his wife. Pt.'s wife and daughter assist with ADLs. Pt. has caregivers in  for 2 hours a day, 6 days a week. Pt. received Rehab services in acute care, at SNF for STR, and [place] services. Pt. is retired from [employer] for [employer] and [employer].    Currently in Pain? Yes    Pain Score 7     Pain Location Shoulder    Pain Orientation Left    Pain Descriptors / Indicators Aching    Pain Type Acute pain    Pain Radiating Towards Numbness radiating  from the neck down to the right hand.    Pain Onset Today              [facility] OT Assessment - 03/01/20 0001      Coordination   Left 9 Hole Peg Test 3 min. & 23 sec.      AROM   Overall AROM Comments Left shoulder AROM: 116, abduction 92, elbow range -10 to 150, wrist extension55, flexion 64, Radial dev.: 20, ulnar dev.: 28      Hand Function   Right Hand Grip (lbs) 35    Right Hand Lateral Pinch 11 lbs    Right Hand 3 Point Pinch 7 lbs    Left Hand Grip (lbs) 25    Left Hand Lateral Pinch 9 lbs    Left 3 point pinch 6 lbs          Pt. Reports waking up this morning with a new onset of numbness throughout the day. Pt. presented to the clinic today with a decrease in AROM in the right shoulder for flexion, and abduction. Decreased grip strength, pinch strength, and [place] skills. Pt. reports 7/10 pian in the right neck/shoulder.  Pt.'s sensation to light touch, and  proprioceptive awareness were intact. Pt.'s BP is 106/63 with HR 64 bpms.  Pt.'s daughter was notified of the changes noted today. These changes are affecting his ability to engage his Left hand during ADL, and IADL tasks. Pt. continues to work on improving UE strength, and [facility] skills in order to work towards improving, and maximizing independence with ADLs, and IADLs                   OT Education - 03/01/20 1455    Education Details scanning, [place], importance of utilizing L UE    Person(s) Educated Patient    Methods Explanation;Demonstration    Comprehension Returned demonstration;Tactile cues required;Verbalized understanding;Need further instruction               OT Long Term Goals - 03/01/20 1455      OT LONG TERM GOAL #1   Title Pt. will increase left shoulder flexion AROM by 10 degrees to access cabinet/shelf.    Baseline 03/01/2020: Left shoulder 116 degrees with new onset of numbness radiating from the left side of the neck to the hand. Pt. continues to progress with left shoulder ROM .Shouler flexion 130, and continues to work on reaching with increasing emphasis on flexing trunk to improve functional reach.  07/21/19- shoulder flexion to 130 with effort    Time 12    Period Weeks    Status Not Met    Target Date 05/24/20      OT LONG TERM GOAL #2   Title Pt. will donn a shirt with Supervision.    Baseline Pt. continues to require modA donning a zip down jacket with consistent cues to initiate the correct direction of the jacket.  Min-modA mahaging the zipper. MinA for donning a T-shirt with assist to roll side to side to pull the shirt down over his back. 07/21/19 continues to require min assist at times for shirt/jacket    Time 12    Period Weeks    Status On-going    Target Date 05/24/20      OT LONG TERM GOAL #4   Title Pt. will improve left grip strength by 10# to be able to open a jar/container.    Baseline Decreased left sided grip strength today  secondary to a  new onset of numbenss, and weakness radiating from the neck to the left hand. Pt. continues to have a harder time opening a wide mouthed jar, however is able to open a smaller jar easier.    Time 12    Period Weeks    Status On-going    Target Date 05/24/20      OT LONG TERM GOAL #5   Title Pt. will improve left hand [facility] skills to be able to assist with buttoning/zipping.    Baseline 03/01/2020: Pt.has had a new onset of weakness, and numbness in the left hand beginning upon waking up this morning which has resulted in a significant change in [facility] skills.  Pt. has improved overall donning a jacket, and  manipulating the zipper on his jacket, however continues to require practice depending on the type of zipper jacket. Pt. continues to work on improving bilateral [facility] skills for buttoning/zipping.  07/21/19 improving with buttons and zippers.    Time 12    Period Weeks    Status On-going    Target Date 05/24/20      OT LONG TERM GOAL #6   Title Pt. will demonstrate visual conmpensatory strategies for 100% of the time during ADLs    Baseline Improving, however Pt. continues to require intermittent cues cues for left sides awareness.    Time 12    Period Weeks    Status Partially Met    Target Date 05/24/20      OT LONG TERM GOAL #8   Title Pt. will accurately identify potential safety hazard using good safety awareness, and judgement 100% for ADLs, and IADLs.    Baseline Pt. continues to require cues    Time 12    Period Weeks    Status On-going    Target Date 03/01/20      OT LONG TERM GOAL  #9   TITLE Pt. will  navigate his w/c around obstacles with Supervision and 100% accuracy    Baseline Pt. continues to require cuing for obstacles on the left, however requires less cues overall. Pt. requires significant cues to engage his left UE during tasks. 07/21/2019 continues to improve with decreased assist at times    Time 12    Period Weeks    Status On-going    Target Date  05/24/20      OT LONG TERM GOAL  #10   TITLE Pt. will independently be able  to reach to place items into cabinetry, and closets.    Baseline Pt. continues to have difficulty with functional reaching conitnues to be limited.    Time 12    Period Weeks    Target Date 05/24/20      OT LONG TERM GOAL  #11   TITLE Pt. will increase BUE strength by 35m grades in preparation for ADL transfers.    Baseline Pt. BUE strength conitnues to be limited. Pt. is able to tolerate increased resistive weights. Pt. continues to work towards improving overall strength for ADLs.    Time 12    Period Weeks    Status On-going    Target Date 05/24/20      OT LONG TERM GOAL  #12   TITLE Pt. will improve LUE functional reaching with the left engaging the trunk 100% of the time during ADL tasks with minimal cues.    Baseline New onset of left sided numbness, and weakness is affecting his ability to perfrom reaching.    Time 12    Period  Weeks    Status On-going    Target Date 05/24/20                 Plan - 03/01/20 1354    Clinical Impression Statement Pt. Reports waking up this morning with a new onset of numbness throughout the day. Pt. presented to the clinic today with a decrease in AROM in the right shoulder for flexion, and abduction. Decreased grip strength, pinch strength, and [place] skills. Pt. reports 7/10 pian in the right neck/shoulder.  Pt.'s sensation to light touch, and proprioceptive awareness were intact. Pt.'s BP is 106/63 with HR 64 bpms.  Pt.'s daughter was notified of the changes noted today. These changes are affecting his ability to engage his Left hand during ADL, and IADL tasks. Pt. continues to work on improving UE strength, and [facility] skills in order to work towards improving, and maximizing independence with ADLs, and IADLs    OT Occupational Profile and History Problem Focused Assessment - Including review of records relating to presenting problem    Occupational performance  deficits (Please refer to evaluation for details): ADL's    Body Structure / Function / Physical Skills UE functional use;Coordination;FMC;Dexterity;Strength;ROM    Cognitive Skills Attention;Memory;Emotional;Problem Solve;Safety Awareness    Rehab Potential Good    Clinical Decision Making Several treatment options, min-mod task modification necessary    Comorbidities Affecting Occupational Performance: Presence of comorbidities impacting occupational performance    Comorbidities impacting occupational performance description: Phyical, cognitive, visual,  medical comorbidities    Modification or Assistance to Complete Evaluation  No modification of tasks or assist necessary to complete eval    OT Frequency 2x / week    OT Duration 12 weeks    OT Treatment/Interventions Self-care/ADL training;DME and/or AE instruction;Therapeutic exercise;Therapeutic activities;Moist Heat;Cognitive remediation/compensation;Neuromuscular education;Visual/perceptual remediation/compensation;Coping strategies training;Patient/family education;Passive range of motion;Psychosocial skills training;Energy conservation;Functional Mobility Training    Consulted and Agree with Plan of Care Patient;Family member/caregiver    Family Member Consulted daughter [name]           Patient will benefit from skilled therapeutic intervention in order to improve the following deficits and impairments:   Body Structure / Function / Physical Skills: UE functional use,Coordination,FMC,Dexterity,Strength,ROM Cognitive Skills: Attention,Memory,Emotional,Problem Solve,Safety Awareness     Visit Diagnosis: Muscle weakness (generalized)  Other lack of coordination    Problem List Patient Active Problem List   Diagnosis Date Noted  . Sepsis ([place]) 10/18/2018    Harrel Carina, MS, OTR/L 03/01/2020, 3:11 PM  [place] MAIN [facility] SERVICES [address] [place], [place], [address] Phone:  [phone]   Fax:  [phone]  Name: Kenneth Summers MRN: [id number] Date of Birth: [birth year]

## 2020-03-01 NOTE — Therapy (Signed)
[facility] MAIN [facility] SERVICES [address] [place], [place], [address] Phone: [phone]   Fax:  [phone]  Physical Therapy Treatment/Physical Therapy Progress Note   Dates of reporting period  01/26/2020  to   03/01/2020  Patient Details  Name: Kenneth Summers MRN: [id number] Date of Birth: Aug 10, 1945 No data recorded  Encounter Date: 03/01/2020   PT End of Session - 03/01/20 1622    Visit Number [id number]    Number of Visits 252    Date for PT Re-Evaluation 04/19/20    Authorization Type Medicare reporting period starting 09/11/18; NO FOTO    Authorization Time Period goals last assessed 03/01/2020    PT Start Time 1300    PT Stop Time 1345    PT Time Calculation (min) 45 min    Equipment Utilized During Treatment Gait belt    Activity Tolerance No increased pain;Patient limited by fatigue;Patient tolerated treatment well    Behavior During Therapy [facility] for tasks assessed/performed           Past Medical History:  Diagnosis Date  . Actinic keratosis   . Alcohol abuse   . APS (antiphospholipid syndrome) (HCC)   . Basal cell carcinoma   . BPH (benign prostatic hyperplasia)   . CAD (coronary artery disease)   . Cellulitis   . Cervical spinal stenosis   . Chronic osteomyelitis (HCC)   . CKD (chronic kidney disease)   . CKD (chronic kidney disease)   . Clostridium difficile diarrhea   . Depression   . Diplopia   . Fatigue   . GERD (gastroesophageal reflux disease)   . Hyperlipemia   . Hypertension   . Hypovitaminosis D   . IBS (irritable bowel syndrome)   . IgA deficiency (HCC)   . Insomnia   . Left lumbosacral radiculopathy   . Moderate obstructive sleep apnea   . Osteomyelitis of foot (HCC)   . PAD (peripheral artery disease) (HCC)   . Pruritus   . RA (rheumatoid arthritis) (HCC)   . Radiculopathy   . Restless legs syndrome   . RLS (restless legs syndrome)   . SLE (systemic lupus erythematosus related syndrome) (HCC)    . 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 Stroke (HCC)   . Toxic maculopathy of both eyes     Past Surgical History:  Procedure Laterality Date  . CARDIAC CATHETERIZATION    . CAROTID ENDARTERECTOMY    . CERVICAL LAMINECTOMY    . CORONARY ANGIOPLASTY    . FRACTURE SURGERY    . fusion C5-6-7    . HERNIA REPAIR    . KNEE ARTHROSCOPY    . TONSILLECTOMY      There were no vitals filed for this visit.   Subjective Assessment - 03/01/20 1620    Subjective Patient reports doing okay today. States he has been trying to exercise more. Reports improved ability to roll in bed with decreased assistance.    Patient is accompained by: Family member    Pertinent History Patient is a 75 y.o. male who presents to outpatient physical therapy with a referral for medical diagnosis of CVA. This patient's chief complaints consist of left hemiplegia and overall deconditioning leading to the following functional deficits: dependent for ADLs, IADLs, unable to transfer to car, dependent transfers at home with hoyer lift, unable to walk or stand without significant assistance, difficulty with W/C navigation..    Limitations Sitting;Lifting;Standing;House hold activities;Writing;Walking;Other (comment)  Diagnostic tests Brain MRI 11/01/2017: IMPRESSION: 1. Acute right ACA territory infarct as demonstrated on prior CT imaging. No intracranial hemorrhage or significant mass effect. 2. 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    Patient Stated Goals wants to be able to walk again    Currently in Pain? No/denies    Pain Onset Today    Pain Onset Today           Treatment:  Assessed Goals today for progress report (see goal section)    Neuro- re ed:  Patient performed sit to stand with max assist (however patient performing closer to 35% effort) with each trial of standing. Able to lean forward with less overall cues than in previous  session.  Standing:  Static stand in //bars= 4 trials today 1) 35 sec 2) 45 sec  3) 40 sec 4)  35 sec  Increased physical assistance with standing today and patient exhibiting decreased hip ext and poor ability to forward weight shift at hips to stand more erect.   Clinical impression: Overall, patient has made some progress this reporting period. Able to roll with decreased physical assistance. He has presented with improved static and dynamic seated balance with decreased physical assistance. He has also returned to standing as part of his treatment with improving standing endurance with assistance.  He has met his goal of no vertigo and partially met navigating his power chair.  His condition has the potential to improve in response to therapy. Maximum improvement is yet to be obtained. The anticipated improvement is attainable and reasonable in a generally predictable time.  Patient reports desire to continue to work to achieve improved overall function in home.                             PT Education - 03/01/20 1621    Education Details Postural cues with standing and visual demonstation and verbal cues with all therex today.    Person(s) Educated Patient    Methods Explanation;Demonstration;Tactile cues;Verbal cues    Comprehension Verbalized understanding;Returned demonstration;Verbal cues required;Tactile cues required;Need further instruction            PT Short Term Goals - 01/26/20 1308      PT SHORT TERM GOAL #1   Title Be independent with initial home exercise program for self-management of symptoms.    Baseline HEP to be given at second session; advice to practice unsupported sitting at home (06/19/2018); 07/08/18: Performing at home, 6/29: needs assistance but is doing exercise program regularly; 08/22/18: adherent;    Time 2    Period Weeks    Status Achieved    Target Date 06/26/18             PT Long Term Goals - 03/01/20 1624      PT  LONG TERM GOAL #1   Title Patient will complete all bed mobility with min A to improve functional independence for getting in and out of bed and adjusting in bed.     Baseline max A - total A reported by family (06/19/2018); 07/08/18: still requires maxA, dtr reports improvement in rolling since starting therapy; 6/23: min A to supervision in clinic; not doing at home right; 8/5: Pt's daughter states that his rolling has improved, but still has difficulty with all other bed mobility; 10/23/18: pts daughter states that she is doing about 35% of the work if he is in proper position,  he does uses the bed rails to help roll;  using Harrel Lemon for coming to sit EOB; 10/12: pts daughter states that she is doing about 30% of the work if he is in proper position, he does uses the bed rails to help roll, but it is improving;  using Harrel Lemon for coming to sit EOB, 10/29: min A for sit to sidelying, rolling supervision, able to transition sidelying to sit with HHA; 12/21: min-mod A. 03/01/2020- Patient presents improving bed mobility with current ability to roll to left with CGA and verbal and tactile cues and min assist to roll to right as seen by recent visits.    Time 12    Period Weeks    Status On-going    Target Date 04/26/20      PT LONG TERM GOAL #2   Title Patient will complete sit <> stand transfer chair to chair with Min A and LRAD to improve functional independence for household and community mobility.     Baseline min A for STS, still requires elevated surface height to 27".  03/01/20- Patient currently unable to perform a chair to chair transfer and continues to be transferred using hoyer lift. Patient has been training with sit to stand at parallel bars and able to currently perform sit to stand with max assist  from 27in. high surface.    Time 12    Period Weeks    Status On-going    Target Date 04/26/20      PT LONG TERM GOAL #3   Title Patient will navigate power w/c with min A x 100 feet to improve  mobility for household and short community distances.    Baseline required total A (06/12/2018); able to roll 20 feet with min A (06/20/2018); 07/08/18: Pt can go approximately 10' without assist per family;  10/23/2018: pt's daughter states that his power WC mobility has become 100% better with the new hand control, slowed speed, and with practice; 10/12: pt's daughter reports that he can perform household WC mobility and limited community ambulation with minA    Time 12    Period Weeks    Status Achieved      PT LONG TERM GOAL #4   Title Patient will complete car transfers W/C to family SUV with min A using LRAD to improve his abilty to participate in community activities.     Baseline unable to complete car transfers at all (06/12/2018); unable to complete car transfers at all (06/19/2018).; 07/08/18: Unable to perform at this time; 09/11/18: unable to perform at this time; 10/23/2018: unable to complete car transfers at all; 10/12:  unable to complete car transfers at all, 10/29: unable; 01/13/19: unable; 2/11: min-mod A +2, 3/10: min A+1, 12/21: mod A. 03/01/2020- Patient unable to complete car transfers and continues to require medical transportation at this time.    Time 12    Period Weeks      PT LONG TERM GOAL #5   Title Patient will ambulate at least 10 feet with min A using LRAD to improve mobility for household and community distances.    Baseline unable to take steps (06/12/2018); able to shift R leg forward and back with max A and RW at edge of plinth (06/19/2018); 07/08/18: unable to ambulate at this time. 09/11/18: unable to perform at this time; 10/23/2018: unable to perform at this time; 10/12: unable to perform at this time, 10/29: unable at this time; 01/13/19: unable at this time, 2/11: min A to step with  RLE, unable to step with LLE, 3/10: able to take 2 steps in parallel bars with min A for safety; 4/1: min A for walking 10 feet in parallel bars, 5/24: min A walking 10 feet in parallel bars; 6/21: no  change 7/14: no change. 03/01/2020- Patient unable to ambulate and currently focusing on static standing. He was able to take a forward step with his right LE last week but unable today.    Time 12    Period Weeks    Status Deferred      Additional Long Term Goals   Additional Long Term Goals Yes      PT LONG TERM GOAL #6   Title Patient will be able to safety navigate up/down ramp with power chair, exhibiting good safety awareness, mod I to safely enter/exit his home.    Baseline 2/11: supervision with intermittent min A;4/1: supervision; 5/24: supervision. 03/01/2020- patient reports that he is able to navigate down the ramp at home to leave the home but continues to need assist to navigate up the ramp into the home.    Time 12    Period Weeks    Status Partially Met      PT LONG TERM GOAL #7   Title Patient will increase functional reach test to >15 inches in sitting to exhibit improved sitting balance and positioning and reduce fall risk;    Baseline 07/30/18: 12 inches; 09/11/18: 10-12 inches; 10/23/18: 12-13 inch; 10/12: 14.5", 10/29: 15.5 inch    Time 4    Period Weeks    Status Achieved      PT LONG TERM GOAL #8   Title Patient will increase LLE quad strength to 3+/5 to improve functional strength for standing and mobility;    Baseline 10/28: 2+/5; 01/13/19: 2+/5, 2/11: 3-/5, 04/16/19: 3-/5, 05/08/19: 3-/5, 5/24: 3-/5, 6/21: 3-/5, 7/14: 3-/5, 12/21: 3-/5. 03/01/2020- Left quad= 3-/5 (focusing on quad strengthening during visits and with home program).    Time 12    Period Weeks    Status On-going      PT LONG TERM GOAL  #9   TITLE Patient will report no vertigo with provoking motions or positions in order to be able to perform ADLs and mobility tasks without symptoms.    Baseline 06/30/19: reports no dizziness in last week; 6/21: one episode this weekend; 7/14: experiencing one a week; 03/01/2020.- Patient has denied any dizziness for over past 3 weeks.    Time 12    Period Weeks     Status Achieved      PT LONG TERM GOAL  #10   TITLE Patient will demonstrate improved functional LE/Postural strength as seen by ability to perform static standing for 2 min or greater with max assist  to assist with strength required for ADL's and transfers/pregait/gait activities.    Baseline 03/01/2020- Patient able to stand approx 40 sec today with max assist in parallel bars.    Time 8    Period Weeks    Target Date 04/26/20                 Plan - 03/01/20 1651    Clinical Impression Statement Overall, patient has made some progress this reporting period. Able to roll with decreased physical assistance. He has presented with improved static and dynamic seated balance with decreased physical assistance. He has also returned to standing as part of his treatment with improving standing endurance with assistance.  He has met his goal of no vertigo and  partially met navigating his power chair.  His condition has the potential to improve in response to therapy. Maximum improvement is yet to be obtained. The anticipated improvement is attainable and reasonable in a generally predictable time.  Patient reports desire to continue to work to achieve improved overall function in home.    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    Examination-Activity Limitations Bed Mobility;Bend;Sit;Toileting;Stand;Stairs;Lift;Transfers;Squat;Locomotion  Level;Carry;Dressing;Hygiene/Grooming;Continence    Examination-Participation Restrictions Yard Work;Interpersonal Relationship;Community Activity    Stability/Clinical Decision Making Evolving/Moderate complexity    Rehab Potential Fair    PT Frequency 3x / week    PT Duration 12 weeks    PT Treatment/Interventions ADLs/Self Care Home Management;Electrical Stimulation;Moist Heat;Cryotherapy;Gait training;Stair training;Functional mobility training;Therapeutic exercise;Balance training;Neuromuscular re-education;Cognitive remediation;Patient/family education;Orthotic Fit/Training;Wheelchair mobility training;Manual techniques;Passive range of motion;Energy conservation;Joint Manipulations;Canalith Repostioning;Therapeutic activities;Vestibular    PT Next Visit Plan Trial of use of lite gait to focus on standing in decreased weighing system.    PT Home Exercise Plan no updates this session    Consulted and Agree with Plan of Care Patient;Family member/caregiver           Patient will benefit from skilled therapeutic intervention in order to improve the following deficits and impairments:  Abnormal gait,Decreased activity tolerance,Decreased cognition,Decreased endurance,Decreased knowledge of use of DME,Decreased range of motion,Decreased skin integrity,Decreased strength,Impaired perceived functional ability,Impaired sensation,Impaired UE functional use,Improper body mechanics,Pain,Cardiopulmonary status limiting activity,Decreased balance,Decreased coordination,Decreased mobility,Difficulty walking,Impaired tone,Postural dysfunction,Dizziness  Visit Diagnosis: Muscle weakness (generalized)  Other lack of coordination  Hemiplegia and hemiparesis following cerebral infarction affecting left non-dominant side ([place])  Vision impairment  Difficulty in walking, not elsewhere classified  Repeated falls  Cognitive communication deficit     Problem List Patient Active Problem List    Diagnosis Date Noted  . Sepsis ([place]) 10/18/2018    Lewis Moccasin 03/01/2020, 4:52 PM  [place] MAIN [facility] SERVICES [address] [place], [place], [address] Phone: [phone]   Fax:  [phone]  Name: RAJVIR ERNSTER MRN: [id number] Date of Birth: 1945-04-24

## 2020-03-03 ENCOUNTER — Ambulatory Visit: Payer: Medicare Other

## 2020-03-03 ENCOUNTER — Ambulatory Visit: Payer: Medicare Other | Admitting: Occupational Therapy

## 2020-03-04 ENCOUNTER — Ambulatory Visit: Payer: Medicare Other | Admitting: Occupational Therapy

## 2020-03-04 ENCOUNTER — Ambulatory Visit: Payer: Medicare Other

## 2020-03-04 ENCOUNTER — Other Ambulatory Visit: Payer: Self-pay

## 2020-03-04 ENCOUNTER — Encounter: Payer: Self-pay | Admitting: Occupational Therapy

## 2020-03-04 DIAGNOSIS — R41841 Cognitive communication deficit: Secondary | ICD-10-CM

## 2020-03-04 DIAGNOSIS — M6281 Muscle weakness (generalized): Secondary | ICD-10-CM

## 2020-03-04 DIAGNOSIS — R278 Other lack of coordination: Secondary | ICD-10-CM

## 2020-03-04 DIAGNOSIS — H547 Unspecified visual loss: Secondary | ICD-10-CM

## 2020-03-04 DIAGNOSIS — R296 Repeated falls: Secondary | ICD-10-CM

## 2020-03-04 DIAGNOSIS — I69354 Hemiplegia and hemiparesis following cerebral infarction affecting left non-dominant side: Secondary | ICD-10-CM

## 2020-03-04 DIAGNOSIS — R262 Difficulty in walking, not elsewhere classified: Secondary | ICD-10-CM

## 2020-03-04 NOTE — Therapy (Signed)
[place] MAIN [facility] SERVICES [address] [place], [place], [address] Phone: [phone]   Fax:  [phone]  Physical Therapy Treatment  Patient Details  Name: Kenneth Summers MRN: [id number] Date of Birth: 02-Jul-1945 No data recorded  Encounter Date: 03/04/2020   PT End of Session - 03/04/20 1510    Visit Number [id number]    Number of Visits 252    Date for PT Re-Evaluation 04/19/20    Authorization Type Medicare reporting period starting 09/11/18; NO FOTO    Authorization Time Period goals last assessed 03/01/2020    PT Start Time 1349    PT Stop Time 1432    PT Time Calculation (min) 43 min    Equipment Utilized During Treatment Gait belt    Activity Tolerance No increased pain;Patient limited by fatigue;Patient tolerated treatment well    Behavior During Therapy [facility] for tasks assessed/performed           Past Medical History:  Diagnosis Date  . Actinic keratosis   . Alcohol abuse   . APS (antiphospholipid syndrome) ([place])   . Basal cell carcinoma   . BPH (benign prostatic hyperplasia)   . CAD (coronary artery disease)   . Cellulitis   . Cervical spinal stenosis   . Chronic osteomyelitis ([place])   . CKD (chronic kidney disease)   . CKD (chronic kidney disease)   . Clostridium difficile diarrhea   . Depression   . Diplopia   . Fatigue   . GERD (gastroesophageal reflux disease)   . Hyperlipemia   . Hypertension   . Hypovitaminosis D   . IBS (irritable bowel syndrome)   . 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 Insomnia   . Left lumbosacral radiculopathy   . Moderate obstructive sleep apnea   . Osteomyelitis of foot ([place])   . PAD (peripheral artery disease) ([place])   . Pruritus   . RA (rheumatoid arthritis) ([place])   . Radiculopathy   . Restless legs syndrome   . RLS (restless legs syndrome)   . SLE (systemic lupus erythematosus related syndrome) ([place])   . 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 Stroke  ([place])   . Toxic maculopathy of both eyes     Past Surgical History:  Procedure Laterality Date  . CARDIAC CATHETERIZATION    . CAROTID ENDARTERECTOMY    . CERVICAL LAMINECTOMY    . CORONARY ANGIOPLASTY    . FRACTURE SURGERY    . fusion C5-6-7    . HERNIA REPAIR    . KNEE ARTHROSCOPY    . TONSILLECTOMY      There were no vitals filed for this visit.   Subjective Assessment - 03/04/20 1507    Subjective Patient reports the [place] service was running late but he is here and ready to work. He denies any pain. Daughter, [name] reports he was moving his left leg in the bed better today during bed mobility.    Patient is accompained by: Family member    Pertinent History Patient is a 75 y.o. male who presents to outpatient physical therapy with a referral for medical diagnosis of CVA. This patient's chief complaints consist of left hemiplegia and overall deconditioning leading to the following functional deficits: dependent for ADLs, IADLs, unable to transfer to car, dependent transfers at home with hoyer lift, unable to walk or stand without significant assistance, difficulty with W/C navigation..    Limitations Sitting;Lifting;Standing;House hold activities;Writing;Walking;Other (comment)  Diagnostic tests Brain MRI 11/01/2017: IMPRESSION: 1. Acute right ACA territory infarct as demonstrated on prior CT imaging. No intracranial hemorrhage or significant mass effect. 2. 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    Patient Stated Goals wants to be able to walk again    Currently in Pain? No/denies    Pain Onset Today    Pain Onset Today           Treatment:  Patient performed seated LE strengthening:  Hip flex B x 15 reps (Mod A on LLE) LAQ B x 15 reps (Mod A on LLE) Hip add/abd x 15 reps (Mod A on LLE)   Neuro re-education:  Dynamic UE reaching and twisting - grabing small red ball with both hands - reaching from left side  To right (Upper trunk  twisting) at varying angles x 3 min. Patient with difficulty due to left sided neglect requiring more verbal/tactile cues.   Dynamic reaching forward to pick up red ball and then UE Shoulder flex/Upper trunk ext) to focus on posture control x1 min.   Static standing in //bars:   4 trials today 1) 30 sec 2) 20 sec- Poor ability to extend trunk and unable to stand erect with max assist 3) 44 sec- Improved ability to extend hips yet unable to maintain > 5 sec 4) 28 sec- Patient able to improve with looking ahead and extending hips  Between 5-8 sec.   Patient continues to present with forward trunk posture and poor left quad control- however able to stand more erect with verbal cues and physical assist.                          PT Education - 03/04/20 1509    Education Details Continued verbal/tactile cues for posture with standing and verbal cues/visual demo for exercise technique.    Person(s) Educated Patient    Methods Explanation;Demonstration;Tactile cues;Verbal cues    Comprehension Verbalized understanding;Tactile cues required;Returned demonstration;Need further instruction;Verbal cues required            PT Short Term Goals - 01/26/20 1308      PT SHORT TERM GOAL #1   Title Be independent with initial home exercise program for self-management of symptoms.    Baseline HEP to be given at second session; advice to practice unsupported sitting at home (06/19/2018); 07/08/18: Performing at home, 6/29: needs assistance but is doing exercise program regularly; 08/22/18: adherent;    Time 2    Period Weeks    Status Achieved    Target Date 06/26/18             PT Long Term Goals - 03/01/20 1624      PT LONG TERM GOAL #1   Title Patient will complete all bed mobility with min A to improve functional independence for getting in and out of bed and adjusting in bed.     Baseline max A - total A reported by family (06/19/2018); 07/08/18: still requires maxA, dtr  reports improvement in rolling since starting therapy; 6/23: min A to supervision in clinic; not doing at home right; 8/5: Pt's daughter states that his rolling has improved, but still has difficulty with all other bed mobility; 10/23/18: pts daughter states that she is doing about 35% of the work if he is in proper position, he does uses the bed rails to help roll;  using Hoyer for coming to sit EOB; 10/12: pts daughter  states that she is doing about 30% of the work if he is in proper position, he does uses the bed rails to help roll, but it is improving;  using Harrel Lemon for coming to sit EOB, 10/29: min A for sit to sidelying, rolling supervision, able to transition sidelying to sit with HHA; 12/21: min-mod A. 03/01/2020- Patient presents improving bed mobility with current ability to roll to left with CGA and verbal and tactile cues and min assist to roll to right as seen by recent visits.    Time 12    Period Weeks    Status On-going    Target Date 04/26/20      PT LONG TERM GOAL #2   Title Patient will complete sit <> stand transfer chair to chair with Min A and LRAD to improve functional independence for household and community mobility.     Baseline min A for STS, still requires elevated surface height to 27".  03/01/20- Patient currently unable to perform a chair to chair transfer and continues to be transferred using hoyer lift. Patient has been training with sit to stand at parallel bars and able to currently perform sit to stand with max assist  from 27in. high surface.    Time 12    Period Weeks    Status On-going    Target Date 04/26/20      PT LONG TERM GOAL #3   Title Patient will navigate power w/c with min A x 100 feet to improve mobility for household and short community distances.    Baseline required total A (06/12/2018); able to roll 20 feet with min A (06/20/2018); 07/08/18: Pt can go approximately 10' without assist per family;  10/23/2018: pt's daughter states that his power WC mobility  has become 100% better with the new hand control, slowed speed, and with practice; 10/12: pt's daughter reports that he can perform household WC mobility and limited community ambulation with minA    Time 12    Period Weeks    Status Achieved      PT LONG TERM GOAL #4   Title Patient will complete car transfers W/C to family SUV with min A using LRAD to improve his abilty to participate in community activities.     Baseline unable to complete car transfers at all (06/12/2018); unable to complete car transfers at all (06/19/2018).; 07/08/18: Unable to perform at this time; 09/11/18: unable to perform at this time; 10/23/2018: unable to complete car transfers at all; 10/12:  unable to complete car transfers at all, 10/29: unable; 01/13/19: unable; 2/11: min-mod A +2, 3/10: min A+1, 12/21: mod A. 03/01/2020- Patient unable to complete car transfers and continues to require medical transportation at this time.    Time 12    Period Weeks      PT LONG TERM GOAL #5   Title Patient will ambulate at least 10 feet with min A using LRAD to improve mobility for household and community distances.    Baseline unable to take steps (06/12/2018); able to shift R leg forward and back with max A and RW at edge of plinth (06/19/2018); 07/08/18: unable to ambulate at this time. 09/11/18: unable to perform at this time; 10/23/2018: unable to perform at this time; 10/12: unable to perform at this time, 10/29: unable at this time; 01/13/19: unable at this time, 2/11: min A to step with RLE, unable to step with LLE, 3/10: able to take 2 steps in parallel bars with min A for safety;  4/1: min A for walking 10 feet in parallel bars, 5/24: min A walking 10 feet in parallel bars; 6/21: no change 7/14: no change. 03/01/2020- Patient unable to ambulate and currently focusing on static standing. He was able to take a forward step with his right LE last week but unable today.    Time 12    Period Weeks    Status Deferred      Additional Long Term  Goals   Additional Long Term Goals Yes      PT LONG TERM GOAL #6   Title Patient will be able to safety navigate up/down ramp with power chair, exhibiting good safety awareness, mod I to safely enter/exit his home.    Baseline 2/11: supervision with intermittent min A;4/1: supervision; 5/24: supervision. 03/01/2020- patient reports that he is able to navigate down the ramp at home to leave the home but continues to need assist to navigate up the ramp into the home.    Time 12    Period Weeks    Status Partially Met      PT LONG TERM GOAL #7   Title Patient will increase functional reach test to >15 inches in sitting to exhibit improved sitting balance and positioning and reduce fall risk;    Baseline 07/30/18: 12 inches; 09/11/18: 10-12 inches; 10/23/18: 12-13 inch; 10/12: 14.5", 10/29: 15.5 inch    Time 4    Period Weeks    Status Achieved      PT LONG TERM GOAL #8   Title Patient will increase LLE quad strength to 3+/5 to improve functional strength for standing and mobility;    Baseline 10/28: 2+/5; 01/13/19: 2+/5, 2/11: 3-/5, 04/16/19: 3-/5, 05/08/19: 3-/5, 5/24: 3-/5, 6/21: 3-/5, 7/14: 3-/5, 12/21: 3-/5. 03/01/2020- Left quad= 3-/5 (focusing on quad strengthening during visits and with home program).    Time 12    Period Weeks    Status On-going      PT LONG TERM GOAL  #9   TITLE Patient will report no vertigo with provoking motions or positions in order to be able to perform ADLs and mobility tasks without symptoms.    Baseline 06/30/19: reports no dizziness in last week; 6/21: one episode this weekend; 7/14: experiencing one a week; 03/01/2020.- Patient has denied any dizziness for over past 3 weeks.    Time 12    Period Weeks    Status Achieved      PT LONG TERM GOAL  #10   TITLE Patient will demonstrate improved functional LE/Postural strength as seen by ability to perform static standing for 2 min or greater with max assist  to assist with strength required for ADL's and  transfers/pregait/gait activities.    Baseline 03/01/2020- Patient able to stand approx 40 sec today with max assist in parallel bars.    Time 8    Period Weeks    Target Date 04/26/20                 Plan - 03/04/20 1519    Clinical Impression Statement Patient exhibited mixed performance today. Initially with increased difficulty activating glutes but did improve with practice yet easily fatigued today with decreased standing endurance overall. On his last trial - patient exhibited his best effort and able to stand - with decreased overall physical assistance. He will continue to benefit from skilled PT services to improve his overall standing endurance/strength and quality of life.    Personal Factors and Comorbidities Age;Comorbidity 3+    Comorbidities  Relevant past medical history and comorbidities include long term steroid use for lupus, CKD, chronic osteomyelitis, cervical spine stenosis, BPH, APS, alcohol abuse, peripheral artery disease, depression, GERD, obstructive sleep apnea, HTN, IBS, IgA deficiency, lumbar radiculopathy, RA, Stroke, toxic maculopathy in both eyes, systemic lupus erythematosus related syndrome, cardiac catheterization, carotid endarterectomy, cervical laminectomy, coronary angioplasty, Fusion C5-C7, knee arthroplasty, lumbar surgery.    Examination-Activity Limitations Bed Mobility;Bend;Sit;Toileting;Stand;Stairs;Lift;Transfers;Squat;Locomotion Level;Carry;Dressing;Hygiene/Grooming;Continence    Examination-Participation Restrictions Yard Work;Interpersonal Relationship;Community Activity    Stability/Clinical Decision Making Evolving/Moderate complexity    Rehab Potential Fair    PT Frequency 3x / week    PT Duration 12 weeks    PT Treatment/Interventions ADLs/Self Care Home Management;Electrical Stimulation;Moist Heat;Cryotherapy;Gait training;Stair training;Functional mobility training;Therapeutic exercise;Balance training;Neuromuscular  re-education;Cognitive remediation;Patient/family education;Orthotic Fit/Training;Wheelchair mobility training;Manual techniques;Passive range of motion;Energy conservation;Joint Manipulations;Canalith Repostioning;Therapeutic activities;Vestibular    PT Next Visit Plan Continue with progressive postural/LE strengthening. Possible trial of lite gait (unweighing system) next week.    PT Home Exercise Plan no updates this session    Consulted and Agree with Plan of Care Patient;Family member/caregiver           Patient will benefit from skilled therapeutic intervention in order to improve the following deficits and impairments:  Abnormal gait,Decreased activity tolerance,Decreased cognition,Decreased endurance,Decreased knowledge of use of DME,Decreased range of motion,Decreased skin integrity,Decreased strength,Impaired perceived functional ability,Impaired sensation,Impaired UE functional use,Improper body mechanics,Pain,Cardiopulmonary status limiting activity,Decreased balance,Decreased coordination,Decreased mobility,Difficulty walking,Impaired tone,Postural dysfunction,Dizziness  Visit Diagnosis: Muscle weakness (generalized)  Difficulty in walking, not elsewhere classified  Repeated falls  Other lack of coordination  Hemiplegia and hemiparesis following cerebral infarction affecting left non-dominant side ([place])  Vision impairment  Cognitive communication deficit     Problem List Patient Active Problem List   Diagnosis Date Noted  . Sepsis ([place]) 10/18/2018    Lewis Moccasin 03/04/2020, 3:35 PM  [place] MAIN [facility] SERVICES [address] [place], [place], [address] Phone: [phone]   Fax:  [phone]  Name: Kenneth Summers MRN: [id number] Date of Birth: Jun 10, 1945

## 2020-03-04 NOTE — Therapy (Signed)
[place] MAIN [facility] SERVICES [address] [place], [place], [address] Phone: [phone]   Fax:  [phone]  Occupational Therapy Treatment  Patient Details  Name: DEMARKUS REMMEL MRN: [id number] Date of Birth: 1945/06/05 No data recorded  Encounter Date: 03/04/2020   OT End of Session - 03/04/20 1350    Visit Number [id number]    Number of Visits 181    Date for OT Re-Evaluation 05/24/20    Authorization Type Progress report period starting 01/26/2020    OT Start Time 1315    OT Stop Time 1345    OT Time Calculation (min) 30 min    Activity Tolerance Patient tolerated treatment well    Behavior During Therapy [facility] for tasks assessed/performed           Past Medical History:  Diagnosis Date  . Actinic keratosis   . Alcohol abuse   . APS (antiphospholipid syndrome) ([place])   . Basal cell carcinoma   . BPH (benign prostatic hyperplasia)   . CAD (coronary artery disease)   . Cellulitis   . Cervical spinal stenosis   . Chronic osteomyelitis ([place])   . CKD (chronic kidney disease)   . CKD (chronic kidney disease)   . Clostridium difficile diarrhea   . Depression   . Diplopia   . Fatigue   . GERD (gastroesophageal reflux disease)   . Hyperlipemia   . Hypertension   . Hypovitaminosis D   . IBS (irritable bowel syndrome)   . 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 Insomnia   . Left lumbosacral radiculopathy   . Moderate obstructive sleep apnea   . Osteomyelitis of foot ([place])   . PAD (peripheral artery disease) ([place])   . Pruritus   . RA (rheumatoid arthritis) ([place])   . Radiculopathy   . Restless legs syndrome   . RLS (restless legs syndrome)   . SLE (systemic lupus erythematosus related syndrome) ([place])   . 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 Stroke ([place])   . Toxic maculopathy of both eyes     Past Surgical History:  Procedure Laterality Date  . CARDIAC CATHETERIZATION    . CAROTID ENDARTERECTOMY    .  CERVICAL LAMINECTOMY    . CORONARY ANGIOPLASTY    . FRACTURE SURGERY    . fusion C5-6-7    . HERNIA REPAIR    . KNEE ARTHROSCOPY    . TONSILLECTOMY      There were no vitals filed for this visit.   Subjective Assessment - 03/04/20 1349    Subjective  Pt. reports feeling better today    Patient is accompanied by: Family member    Pertinent History Pt. is a 75 y.o. 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 Pt. PMHx includes: Multiple Falls, Lupus, DJD, Renal Abscess, CVA. Pt. resides with his wife. Pt.'s wife and daughter assist with ADLs. Pt. has caregivers in  for 2 hours a day, 6 days a week. Pt. received Rehab services in acute care, at SNF for STR, and [place] services. Pt. is retired from [employer] for [employer] and [employer].    Currently in Pain? No/denies          Self-care:  Pt. worked on UE dressing skills donning his  jacket. Pt. required mod assist to donn his jacket, with verbal cues and assist to initiate threadinghis LUE. Pt.required increased time, andstep by stepcues forinstruction  throughout the task.Pt.Continues toInitially place the hooded sweatshirt onbackwards, and requiresconsisitent cues for the right direction.Pt.continues to require min-modAto manage his zipper, requiring cues to engage his left hand in stabilizing the jacket.   There. Ex.  Pt. performed2.5# dowel ex. For UE strengthening secondary to weakness. Bilateral shoulder flexion, chest press, circular patterns, and elbow flexion/extension were performedfor 2 sets 10-20 reps each.Pt. worked on BUE strength using the UBE seated in the forward position. Pt.  required assist for upright midline sitting with a pillow for left lateral support.   Pt. was late for the session secondary to transportation services being delayed. Pt. reports feeling better than Monday, and that the numbness he was experiencing is feeling better. Pt. requires verbal cues,  and tactile cues initially to orient himself to the jacket in preparation for donning the jacket in the corect direction, and sequence.  Pt.continues torequire increased cues for problem-solving through each step of the task. Pt. continues to benefit from additional work on improving UE dressing skills. Pt. continues to work on improving UE strength, [facility] skills, and visual perceptual skills in order to improve,andmaximize independence with ADLs, and IADL tasks.                           OT Education - 03/04/20 1350    Education Details scanning, [place], importance of utilizing L UE    Person(s) Educated Patient    Methods Explanation;Demonstration    Comprehension Returned demonstration;Tactile cues required;Verbalized understanding;Need further instruction               OT Long Term Goals - 03/01/20 1455      OT LONG TERM GOAL #1   Title Pt. will increase left shoulder flexion AROM by 10 degrees to access cabinet/shelf.    Baseline 03/01/2020: Left shoulder 116 degrees with new onset of numbness radiating from the left side of the neck to the hand. Pt. continues to progress with left shoulder ROM .Shouler flexion 130, and continues to work on reaching with increasing emphasis on flexing trunk to improve functional reach.  07/21/19- shoulder flexion to 130 with effort    Time 12    Period Weeks    Status Not Met    Target Date 05/24/20      OT LONG TERM GOAL #2   Title Pt. will donn a shirt with Supervision.    Baseline Pt. continues to require modA donning a zip down jacket with consistent cues to initiate the correct direction of the jacket.  Min-modA mahaging the zipper. MinA for donning a T-shirt with assist to roll side to side to pull the shirt down over his back. 07/21/19 continues to require min assist at times for shirt/jacket    Time 12    Period Weeks    Status On-going    Target Date 05/24/20      OT LONG TERM GOAL #4   Title Pt. will improve left  grip strength by 10# to be able to open a jar/container.    Baseline Decreased left sided grip strength today secondary to a new onset of numbenss, and weakness radiating from the neck to the left hand. Pt. continues to have a harder time opening a wide mouthed jar, however is able to open a smaller jar easier.    Time 12    Period Weeks    Status On-going    Target Date 05/24/20      OT LONG TERM GOAL #  5   Title Pt. will improve left hand [facility] skills to be able to assist with buttoning/zipping.    Baseline 03/01/2020: Pt.has had a new onset of weakness, and numbness in the left hand beginning upon waking up this morning which has resulted in a significant change in [facility] skills.  Pt. has improved overall donning a jacket, and  manipulating the zipper on his jacket, however continues to require practice depending on the type of zipper jacket. Pt. continues to work on improving bilateral [facility] skills for buttoning/zipping.  07/21/19 improving with buttons and zippers.    Time 12    Period Weeks    Status On-going    Target Date 05/24/20      OT LONG TERM GOAL #6   Title Pt. will demonstrate visual conmpensatory strategies for 100% of the time during ADLs    Baseline Improving, however Pt. continues to require intermittent cues cues for left sides awareness.    Time 12    Period Weeks    Status Partially Met    Target Date 05/24/20      OT LONG TERM GOAL #8   Title Pt. will accurately identify potential safety hazard using good safety awareness, and judgement 100% for ADLs, and IADLs.    Baseline Pt. continues to require cues    Time 12    Period Weeks    Status On-going    Target Date 03/01/20      OT LONG TERM GOAL  #9   TITLE Pt. will  navigate his w/c around obstacles with Supervision and 100% accuracy    Baseline Pt. continues to require cuing for obstacles on the left, however requires less cues overall. Pt. requires significant cues to engage his left UE during tasks. 07/21/2019  continues to improve with decreased assist at times    Time 12    Period Weeks    Status On-going    Target Date 05/24/20      OT LONG TERM GOAL  #10   TITLE Pt. will independently be able  to reach to place items into cabinetry, and closets.    Baseline Pt. continues to have difficulty with functional reaching conitnues to be limited.    Time 12    Period Weeks    Target Date 05/24/20      OT LONG TERM GOAL  #11   TITLE Pt. will increase BUE strength by 69mm grades in preparation for ADL transfers.    Baseline Pt. BUE strength conitnues to be limited. Pt. is able to tolerate increased resistive weights. Pt. continues to work towards improving overall strength for ADLs.    Time 12    Period Weeks    Status On-going    Target Date 05/24/20      OT LONG TERM GOAL  #12   TITLE Pt. will improve LUE functional reaching with the left engaging the trunk 100% of the time during ADL tasks with minimal cues.    Baseline New onset of left sided numbness, and weakness is affecting his ability to perfrom reaching.    Time 12    Period Weeks    Status On-going    Target Date 05/24/20                 Plan - 03/04/20 1350    Clinical Impression Statement Pt. was late for the session secondary to transportation services being delayed. Pt. reports feeling better than Monday, and that the numbness he was experiencing is feeling  better. Pt. requires verbal cues, and tactile cues initially to orient himself to the jacket in preparation for donning the jacket in the corect direction, and sequence.  Pt.continues torequire increased cues for problem-solving through each step of the task. Pt. continues to benefit from additional work on improving UE dressing skills. Pt. continues to work on improving UE strength, [facility] skills, and visual perceptual skills in order to improve,andmaximize independence with ADLs, and IADL tasks.   OT Occupational Profile and History Problem Focused Assessment -  Including review of records relating to presenting problem    Occupational performance deficits (Please refer to evaluation for details): ADL's    Body Structure / Function / Physical Skills UE functional use;Coordination;FMC;Dexterity;Strength;ROM    Cognitive Skills Attention;Memory;Emotional;Problem Solve;Safety Awareness    Rehab Potential Good    Clinical Decision Making Several treatment options, min-mod task modification necessary    Comorbidities Affecting Occupational Performance: Presence of comorbidities impacting occupational performance    Comorbidities impacting occupational performance description: Phyical, cognitive, visual,  medical comorbidities    Modification or Assistance to Complete Evaluation  No modification of tasks or assist necessary to complete eval    OT Frequency 2x / week    OT Duration 12 weeks    Consulted and Agree with Plan of Care Patient;Family member/caregiver           Patient will benefit from skilled therapeutic intervention in order to improve the following deficits and impairments:   Body Structure / Function / Physical Skills: UE functional use,Coordination,FMC,Dexterity,Strength,ROM Cognitive Skills: Attention,Memory,Emotional,Problem Solve,Safety Awareness     Visit Diagnosis: Muscle weakness (generalized)  Other lack of coordination    Problem List Patient Active Problem List   Diagnosis Date Noted  . Sepsis ([place]) 10/18/2018    Harrel Carina, MS, OTR/L 03/04/2020, 1:54 PM  [place] MAIN [facility] SERVICES [address] [place], [place], [address] Phone: [phone]   Fax:  [phone]  Name: KAHMARI KOLLER MRN: [id number] Date of Birth: April 02, 1945

## 2020-03-08 ENCOUNTER — Encounter: Payer: Self-pay | Admitting: Occupational Therapy

## 2020-03-08 ENCOUNTER — Ambulatory Visit: Payer: Medicare Other

## 2020-03-08 ENCOUNTER — Ambulatory Visit: Payer: Medicare Other | Admitting: Occupational Therapy

## 2020-03-08 ENCOUNTER — Other Ambulatory Visit: Payer: Self-pay

## 2020-03-08 DIAGNOSIS — R262 Difficulty in walking, not elsewhere classified: Secondary | ICD-10-CM

## 2020-03-08 DIAGNOSIS — I69354 Hemiplegia and hemiparesis following cerebral infarction affecting left non-dominant side: Secondary | ICD-10-CM

## 2020-03-08 DIAGNOSIS — R296 Repeated falls: Secondary | ICD-10-CM

## 2020-03-08 DIAGNOSIS — M6281 Muscle weakness (generalized): Secondary | ICD-10-CM

## 2020-03-08 DIAGNOSIS — R278 Other lack of coordination: Secondary | ICD-10-CM

## 2020-03-08 DIAGNOSIS — R41841 Cognitive communication deficit: Secondary | ICD-10-CM

## 2020-03-08 NOTE — Therapy (Signed)
[place] MAIN [facility] SERVICES [address] [place], [place], [address] Phone: [phone]   Fax:  [phone]  Occupational Therapy Treatment  Patient Details  Name: Kenneth Summers MRN: [id number] Date of Birth: [birth year] No data recorded  Encounter Date: 03/08/2020   OT End of Session - 03/08/20 1632    Visit Number [id number]    Number of Visits 205    Date for OT Re-Evaluation 05/24/20    Authorization Type Progress report period starting 01/26/2020    OT Start Time 1350    OT Stop Time 1430    OT Time Calculation (min) 40 min    Activity Tolerance Patient tolerated treatment well    Behavior During Therapy [facility] for tasks assessed/performed           Past Medical History:  Diagnosis Date  . Actinic keratosis   . Alcohol abuse   . APS (antiphospholipid syndrome) ([place])   . Basal cell carcinoma   . BPH (benign prostatic hyperplasia)   . CAD (coronary artery disease)   . Cellulitis   . Cervical spinal stenosis   . Chronic osteomyelitis ([place])   . CKD (chronic kidney disease)   . CKD (chronic kidney disease)   . Clostridium difficile diarrhea   . Depression   . Diplopia   . Fatigue   . GERD (gastroesophageal reflux disease)   . Hyperlipemia   . Hypertension   . Hypovitaminosis D   . IBS (irritable bowel syndrome)   . 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 Insomnia   . Left lumbosacral radiculopathy   . Moderate obstructive sleep apnea   . Osteomyelitis of foot ([place])   . PAD (peripheral artery disease) ([place])   . Pruritus   . RA (rheumatoid arthritis) ([place])   . Radiculopathy   . Restless legs syndrome   . RLS (restless legs syndrome)   . SLE (systemic lupus erythematosus related syndrome) ([place])   . 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 Stroke ([place])   . Toxic maculopathy of both eyes     Past Surgical History:  Procedure Laterality Date  . CARDIAC CATHETERIZATION    . CAROTID ENDARTERECTOMY    .  CERVICAL LAMINECTOMY    . CORONARY ANGIOPLASTY    . FRACTURE SURGERY    . fusion C5-6-7    . HERNIA REPAIR    . KNEE ARTHROSCOPY    . TONSILLECTOMY      There were no vitals filed for this visit.   Subjective Assessment - 03/08/20 1631    Subjective  Pt. reports feeling better today    Patient is accompanied by: Family member    Pertinent History Pt. is a 75 y.o. 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 Pt. PMHx includes: Multiple Falls, Lupus, DJD, Renal Abscess, CVA. Pt. resides with his wife. Pt.'s wife and daughter assist with ADLs. Pt. has caregivers in  for 2 hours a day, 6 days a week. Pt. received Rehab services in acute care, at SNF for STR, and [place] services. Pt. is retired from [employer] for [employer] and [employer].    Currently in Pain? No/denies                 Self-care:  Pt. worked on Pharmacist, community. Pt. required mod assist to donn his jacket, with verbal cues and assist to initiate threadinghis LUE. Pt.required increased  time, andstep by stepcues forinstruction throughout the task.Pt.Continues toInitially place his jacket onbackwards, and requiresconsistent cues for the right direction.Pt.continues to require min-modAto manage his zipper, requiring cues to engage his left hand in stabilizing the jacket.   There. Ex.  Pt. performed2.5# dowel ex. For UE strengthening secondary to weakness. Bilateral shoulder flexion, chest press, circular patterns, and elbow flexion/extension were performedfor 2 sets 10-20 reps each.Pt. worked on BUE strength using the UBE seated in the forward position. Pt.  required assist for upright midline sitting with a pillow for left lateral support.   Pt. requires verbal cues, and tactile cues initially to orient himself to the jacket in preparation for donning the jacket in the correct direction, and sequence.Pt.continues torequire increased cues  forproblem-solving througheach step of the task. Pt. continues to benefit from additional work on improving UE dressing skills. Pt. continues to work on improving UE strength, [facility] skills, and visual perceptual skills in order to improve,andmaximize independence with ADLs, and IADL tasks.                         OT Education - 03/08/20 1632    Education Details strengthening, UE functioning    Person(s) Educated Patient    Methods Explanation;Demonstration    Comprehension Returned demonstration;Tactile cues required;Verbalized understanding;Need further instruction               OT Long Term Goals - 03/01/20 1455      OT LONG TERM GOAL #1   Title Pt. will increase left shoulder flexion AROM by 10 degrees to access cabinet/shelf.    Baseline 03/01/2020: Left shoulder 116 degrees with new onset of numbness radiating from the left side of the neck to the hand. Pt. continues to progress with left shoulder ROM .Shouler flexion 130, and continues to work on reaching with increasing emphasis on flexing trunk to improve functional reach.  07/21/19- shoulder flexion to 130 with effort    Time 12    Period Weeks    Status Not Met    Target Date 05/24/20      OT LONG TERM GOAL #2   Title Pt. will donn a shirt with Supervision.    Baseline Pt. continues to require modA donning a zip down jacket with consistent cues to initiate the correct direction of the jacket.  Min-modA mahaging the zipper. MinA for donning a T-shirt with assist to roll side to side to pull the shirt down over his back. 07/21/19 continues to require min assist at times for shirt/jacket    Time 12    Period Weeks    Status On-going    Target Date 05/24/20      OT LONG TERM GOAL #4   Title Pt. will improve left grip strength by 10# to be able to open a jar/container.    Baseline Decreased left sided grip strength today secondary to a new onset of numbenss, and weakness radiating from the neck to the  left hand. Pt. continues to have a harder time opening a wide mouthed jar, however is able to open a smaller jar easier.    Time 12    Period Weeks    Status On-going    Target Date 05/24/20      OT LONG TERM GOAL #5   Title Pt. will improve left hand [facility] skills to be able to assist with buttoning/zipping.    Baseline 03/01/2020: Pt.has had a new onset of weakness, and numbness in the  left hand beginning upon waking up this morning which has resulted in a significant change in [facility] skills.  Pt. has improved overall donning a jacket, and  manipulating the zipper on his jacket, however continues to require practice depending on the type of zipper jacket. Pt. continues to work on improving bilateral [facility] skills for buttoning/zipping.  07/21/19 improving with buttons and zippers.    Time 12    Period Weeks    Status On-going    Target Date 05/24/20      OT LONG TERM GOAL #6   Title Pt. will demonstrate visual conmpensatory strategies for 100% of the time during ADLs    Baseline Improving, however Pt. continues to require intermittent cues cues for left sides awareness.    Time 12    Period Weeks    Status Partially Met    Target Date 05/24/20      OT LONG TERM GOAL #8   Title Pt. will accurately identify potential safety hazard using good safety awareness, and judgement 100% for ADLs, and IADLs.    Baseline Pt. continues to require cues    Time 12    Period Weeks    Status On-going    Target Date 03/01/20      OT LONG TERM GOAL  #9   TITLE Pt. will  navigate his w/c around obstacles with Supervision and 100% accuracy    Baseline Pt. continues to require cuing for obstacles on the left, however requires less cues overall. Pt. requires significant cues to engage his left UE during tasks. 07/21/2019 continues to improve with decreased assist at times    Time 12    Period Weeks    Status On-going    Target Date 05/24/20      OT LONG TERM GOAL  #10   TITLE Pt. will independently be able  to  reach to place items into cabinetry, and closets.    Baseline Pt. continues to have difficulty with functional reaching conitnues to be limited.    Time 12    Period Weeks    Target Date 05/24/20      OT LONG TERM GOAL  #11   TITLE Pt. will increase BUE strength by 24mm grades in preparation for ADL transfers.    Baseline Pt. BUE strength conitnues to be limited. Pt. is able to tolerate increased resistive weights. Pt. continues to work towards improving overall strength for ADLs.    Time 12    Period Weeks    Status On-going    Target Date 05/24/20      OT LONG TERM GOAL  #12   TITLE Pt. will improve LUE functional reaching with the left engaging the trunk 100% of the time during ADL tasks with minimal cues.    Baseline New onset of left sided numbness, and weakness is affecting his ability to perfrom reaching.    Time 12    Period Weeks    Status On-going    Target Date 05/24/20                 Plan - 03/08/20 1633    Clinical Impression Statement Pt. requires verbal cues, and tactile cues initially to orient himself to the jacket in preparation for donning the jacket in the correct direction, and sequence.Pt.continues torequire increased cues forproblem-solving througheach step of the task. Pt. continues to benefit from additional work on improving UE dressing skills. Pt. continues to work on improving UE strength, [facility] skills, and visual perceptual skills  in order to improve,andmaximize independence with ADLs, and IADL tasks.   OT Occupational Profile and History Problem Focused Assessment - Including review of records relating to presenting problem    Occupational performance deficits (Please refer to evaluation for details): ADL's    Body Structure / Function / Physical Skills UE functional use;Coordination;FMC;Dexterity;Strength;ROM    Cognitive Skills Attention;Memory;Emotional;Problem Solve;Safety Awareness    Rehab Potential Good    Clinical Decision Making  Several treatment options, min-mod task modification necessary    Comorbidities Affecting Occupational Performance: Presence of comorbidities impacting occupational performance    Comorbidities impacting occupational performance description: Phyical, cognitive, visual,  medical comorbidities    Modification or Assistance to Complete Evaluation  No modification of tasks or assist necessary to complete eval    OT Frequency 2x / week    OT Duration 12 weeks    OT Treatment/Interventions Self-care/ADL training;DME and/or AE instruction;Therapeutic exercise;Therapeutic activities;Moist Heat;Cognitive remediation/compensation;Neuromuscular education;Visual/perceptual remediation/compensation;Coping strategies training;Patient/family education;Passive range of motion;Psychosocial skills training;Energy conservation;Functional Mobility Training    Consulted and Agree with Plan of Care Patient;Family member/caregiver           Patient will benefit from skilled therapeutic intervention in order to improve the following deficits and impairments:   Body Structure / Function / Physical Skills: UE functional use,Coordination,FMC,Dexterity,Strength,ROM Cognitive Skills: Attention,Memory,Emotional,Problem Solve,Safety Awareness     Visit Diagnosis: Muscle weakness (generalized)  Other lack of coordination    Problem List Patient Active Problem List   Diagnosis Date Noted  . Sepsis ([place]) 10/18/2018    Harrel Carina, MS, OTR/L 03/08/2020, 4:35 PM  [place] MAIN [facility] SERVICES [address] [place], [place], [address] Phone: [phone]   Fax:  [phone]  Name: Kenneth Summers MRN: [id number] Date of Birth: [birth year]

## 2020-03-08 NOTE — Therapy (Signed)
[place] MAIN [facility] SERVICES [address] [place], [place], [address] Phone: [phone]   Fax:  [phone]  Physical Therapy Treatment  Patient Details  Name: Kenneth Summers MRN: [id number] Date of Birth: Dec 14, 1945 No data recorded  Encounter Date: 03/08/2020   PT End of Session - 03/08/20 1736    Visit Number [id number]    PT Start Time 1300    PT Stop Time 1344    PT Time Calculation (min) 44 min    Equipment Utilized During Treatment Gait belt    Activity Tolerance Patient tolerated treatment well;No increased pain    Behavior During Therapy WFL for tasks assessed/performed           Past Medical History:  Diagnosis Date  . Actinic keratosis   . Alcohol abuse   . APS (antiphospholipid syndrome) ([place])   . Basal cell carcinoma   . BPH (benign prostatic hyperplasia)   . CAD (coronary artery disease)   . Cellulitis   . Cervical spinal stenosis   . Chronic osteomyelitis ([place])   . CKD (chronic kidney disease)   . CKD (chronic kidney disease)   . Clostridium difficile diarrhea   . Depression   . Diplopia   . Fatigue   . GERD (gastroesophageal reflux disease)   . Hyperlipemia   . Hypertension   . Hypovitaminosis D   . IBS (irritable bowel syndrome)   . 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 Insomnia   . Left lumbosacral radiculopathy   . Moderate obstructive sleep apnea   . Osteomyelitis of foot ([place])   . PAD (peripheral artery disease) ([place])   . Pruritus   . RA (rheumatoid arthritis) ([place])   . Radiculopathy   . Restless legs syndrome   . RLS (restless legs syndrome)   . SLE (systemic lupus erythematosus related syndrome) ([place])   . 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 Stroke ([place])   . Toxic maculopathy of both eyes     Past Surgical History:  Procedure Laterality Date  . CARDIAC CATHETERIZATION    . CAROTID ENDARTERECTOMY    . CERVICAL LAMINECTOMY    . CORONARY ANGIOPLASTY    . FRACTURE  SURGERY    . fusion C5-6-7    . HERNIA REPAIR    . KNEE ARTHROSCOPY    . TONSILLECTOMY      There were no vitals filed for this visit.   Subjective Assessment - 03/08/20 1305    Subjective Patient reports no new complaints. Denies any pain or changes in meds.    Patient is accompained by: Family member    Pertinent History Patient is a 75 y.o. male who presents to outpatient physical therapy with a referral for medical diagnosis of CVA. This patient's chief complaints consist of left hemiplegia and overall deconditioning leading to the following functional deficits: dependent for ADLs, IADLs, unable to transfer to car, dependent transfers at home with hoyer lift, unable to walk or stand without significant assistance, difficulty with W/C navigation..    Limitations Sitting;Lifting;Standing;House hold activities;Writing;Walking;Other (comment)    Diagnostic tests Brain MRI 11/01/2017: IMPRESSION: 1. Acute right ACA territory infarct as demonstrated on prior CT imaging. No intracranial hemorrhage or significant mass effect. 2. 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    Patient Stated Goals wants to be able to walk again    Currently in Pain? No/denies    Pain Onset Today  Pain Onset Today             Treatment:  Neuro re-education:  Static sitting in power chair with patient sitting upright (attempting no back support) Dynamic UE reaching at varying angles x 1 min followed by 1 min rest then multiple sets at 1 min - total upright sitting = 15 min   Static standing in //bars: Max assist to stand. Mod A to Max A to maintain standing with constant verbal cues to stand erect, postural verbal/tactile cues.   4 trials today-  1) 1 min 20 sec- Patient able to extend hips and position his center of gravity over his toes better with max assist.  2) 1 min 10 sec-  3) 45 sec-  4) 40 sec-   Patient continues to present with forward trunk posture and  poor left quad control- however able to stand more erect with verbal cues and less physical assist today.                                       PT Education - 03/08/20 1736    Education Details Postural cues with standing/sitting    Person(s) Educated Patient    Methods Explanation;Demonstration;Tactile cues;Verbal cues    Comprehension Verbalized understanding;Need further instruction;Returned demonstration;Verbal cues required;Tactile cues required            PT Short Term Goals - 01/26/20 1308      PT SHORT TERM GOAL #1   Title Be independent with initial home exercise program for self-management of symptoms.    Baseline HEP to be given at second session; advice to practice unsupported sitting at home (06/19/2018); 07/08/18: Performing at home, 6/29: needs assistance but is doing exercise program regularly; 08/22/18: adherent;    Time 2    Period Weeks    Status Achieved    Target Date 06/26/18             PT Long Term Goals - 03/01/20 1624      PT LONG TERM GOAL #1   Title Patient will complete all bed mobility with min A to improve functional independence for getting in and out of bed and adjusting in bed.     Baseline max A - total A reported by family (06/19/2018); 07/08/18: still requires maxA, dtr reports improvement in rolling since starting therapy; 6/23: min A to supervision in clinic; not doing at home right; 8/5: Pt's daughter states that his rolling has improved, but still has difficulty with all other bed mobility; 10/23/18: pts daughter states that she is doing about 35% of the work if he is in proper position, he does uses the bed rails to help roll;  using Harrel Lemon for coming to sit EOB; 10/12: pts daughter states that she is doing about 30% of the work if he is in proper position, he does uses the bed rails to help roll, but it is improving;  using Harrel Lemon for coming to sit EOB, 10/29: min A for sit to sidelying, rolling supervision, able to  transition sidelying to sit with HHA; 12/21: min-mod A. 03/01/2020- Patient presents improving bed mobility with current ability to roll to left with CGA and verbal and tactile cues and min assist to roll to right as seen by recent visits.    Time 12    Period Weeks    Status On-going    Target Date 04/26/20  PT LONG TERM GOAL #2   Title Patient will complete sit <> stand transfer chair to chair with Min A and LRAD to improve functional independence for household and community mobility.     Baseline min A for STS, still requires elevated surface height to 27".  03/01/20- Patient currently unable to perform a chair to chair transfer and continues to be transferred using hoyer lift. Patient has been training with sit to stand at parallel bars and able to currently perform sit to stand with max assist  from 27in. high surface.    Time 12    Period Weeks    Status On-going    Target Date 04/26/20      PT LONG TERM GOAL #3   Title Patient will navigate power w/c with min A x 100 feet to improve mobility for household and short community distances.    Baseline required total A (06/12/2018); able to roll 20 feet with min A (06/20/2018); 07/08/18: Pt can go approximately 10' without assist per family;  10/23/2018: pt's daughter states that his power WC mobility has become 100% better with the new hand control, slowed speed, and with practice; 10/12: pt's daughter reports that he can perform household WC mobility and limited community ambulation with minA    Time 12    Period Weeks    Status Achieved      PT LONG TERM GOAL #4   Title Patient will complete car transfers W/C to family SUV with min A using LRAD to improve his abilty to participate in community activities.     Baseline unable to complete car transfers at all (06/12/2018); unable to complete car transfers at all (06/19/2018).; 07/08/18: Unable to perform at this time; 09/11/18: unable to perform at this time; 10/23/2018: unable to complete car  transfers at all; 10/12:  unable to complete car transfers at all, 10/29: unable; 01/13/19: unable; 2/11: min-mod A +2, 3/10: min A+1, 12/21: mod A. 03/01/2020- Patient unable to complete car transfers and continues to require medical transportation at this time.    Time 12    Period Weeks      PT LONG TERM GOAL #5   Title Patient will ambulate at least 10 feet with min A using LRAD to improve mobility for household and community distances.    Baseline unable to take steps (06/12/2018); able to shift R leg forward and back with max A and RW at edge of plinth (06/19/2018); 07/08/18: unable to ambulate at this time. 09/11/18: unable to perform at this time; 10/23/2018: unable to perform at this time; 10/12: unable to perform at this time, 10/29: unable at this time; 01/13/19: unable at this time, 2/11: min A to step with RLE, unable to step with LLE, 3/10: able to take 2 steps in parallel bars with min A for safety; 4/1: min A for walking 10 feet in parallel bars, 5/24: min A walking 10 feet in parallel bars; 6/21: no change 7/14: no change. 03/01/2020- Patient unable to ambulate and currently focusing on static standing. He was able to take a forward step with his right LE last week but unable today.    Time 12    Period Weeks    Status Deferred      Additional Long Term Goals   Additional Long Term Goals Yes      PT LONG TERM GOAL #6   Title Patient will be able to safety navigate up/down ramp with power chair, exhibiting good safety awareness, mod I  to safely enter/exit his home.    Baseline 2/11: supervision with intermittent min A;4/1: supervision; 5/24: supervision. 03/01/2020- patient reports that he is able to navigate down the ramp at home to leave the home but continues to need assist to navigate up the ramp into the home.    Time 12    Period Weeks    Status Partially Met      PT LONG TERM GOAL #7   Title Patient will increase functional reach test to >15 inches in sitting to exhibit improved  sitting balance and positioning and reduce fall risk;    Baseline 07/30/18: 12 inches; 09/11/18: 10-12 inches; 10/23/18: 12-13 inch; 10/12: 14.5", 10/29: 15.5 inch    Time 4    Period Weeks    Status Achieved      PT LONG TERM GOAL #8   Title Patient will increase LLE quad strength to 3+/5 to improve functional strength for standing and mobility;    Baseline 10/28: 2+/5; 01/13/19: 2+/5, 2/11: 3-/5, 04/16/19: 3-/5, 05/08/19: 3-/5, 5/24: 3-/5, 6/21: 3-/5, 7/14: 3-/5, 12/21: 3-/5. 03/01/2020- Left quad= 3-/5 (focusing on quad strengthening during visits and with home program).    Time 12    Period Weeks    Status On-going      PT LONG TERM GOAL  #9   TITLE Patient will report no vertigo with provoking motions or positions in order to be able to perform ADLs and mobility tasks without symptoms.    Baseline 06/30/19: reports no dizziness in last week; 6/21: one episode this weekend; 7/14: experiencing one a week; 03/01/2020.- Patient has denied any dizziness for over past 3 weeks.    Time 12    Period Weeks    Status Achieved      PT LONG TERM GOAL  #10   TITLE Patient will demonstrate improved functional LE/Postural strength as seen by ability to perform static standing for 2 min or greater with max assist  to assist with strength required for ADL's and transfers/pregait/gait activities.    Baseline 03/01/2020- Patient able to stand approx 40 sec today with max assist in parallel bars.    Time 8    Period Weeks    Target Date 04/26/20                 Plan - 03/08/20 1740    Clinical Impression Statement Patient demo more progress with overall trunk posture and able to elicit more hip extension and gluteal activation versus past 1-2 weeks. Patient also able to perform approx 15 min of sitting activities with no back support today.Patient will continue to benefit from skilled PT services to continue to focus on functional mobility treatment and improve his quality of life with decreased risk of  falling.    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    Examination-Activity Limitations Bed Mobility;Bend;Sit;Toileting;Stand;Stairs;Lift;Transfers;Squat;Locomotion Level;Carry;Dressing;Hygiene/Grooming;Continence    Examination-Participation Restrictions Yard Work;Interpersonal Relationship;Community Activity    Stability/Clinical Decision Making Evolving/Moderate complexity    Rehab Potential Fair    PT Frequency 3x / week    PT Duration 12 weeks    PT Treatment/Interventions  ADLs/Self Care Home Management;Electrical Stimulation;Moist Heat;Cryotherapy;Gait training;Stair training;Functional mobility training;Therapeutic exercise;Balance training;Neuromuscular re-education;Cognitive remediation;Patient/family education;Orthotic Fit/Training;Wheelchair mobility training;Manual techniques;Passive range of motion;Energy conservation;Joint Manipulations;Canalith Repostioning;Therapeutic activities;Vestibular    PT Next Visit Plan Continue with progressive postural/LE strengthening. Possible trial of lite gait (unweighing system) next week.    PT Home Exercise Plan no updates this session    Consulted and Agree with Plan of Care Patient;Family member/caregiver           Patient will benefit from skilled therapeutic intervention in order to improve the following deficits and impairments:  Abnormal gait,Decreased activity tolerance,Decreased cognition,Decreased  endurance,Decreased knowledge of use of DME,Decreased range of motion,Decreased skin integrity,Decreased strength,Impaired perceived functional ability,Impaired sensation,Impaired UE functional use,Improper body mechanics,Pain,Cardiopulmonary status limiting activity,Decreased balance,Decreased coordination,Decreased mobility,Difficulty walking,Impaired tone,Postural dysfunction,Dizziness  Visit Diagnosis: Muscle weakness (generalized)  Other lack of coordination  Difficulty in walking, not elsewhere classified  Repeated falls  Hemiplegia and hemiparesis following cerebral infarction affecting left non-dominant side ([place])  Cognitive communication deficit     Problem List Patient Active Problem List   Diagnosis Date Noted  . Sepsis ([place]) 10/18/2018    Lewis Moccasin, PT 03/08/2020, 5:53 PM  [place] MAIN [facility] SERVICES [address] [place], [place], [address] Phone: [phone]   Fax:  [phone]  Name: Kenneth Summers MRN: [id number] Date of Birth: 03-04-1945

## 2020-03-10 ENCOUNTER — Other Ambulatory Visit: Payer: Self-pay

## 2020-03-10 ENCOUNTER — Ambulatory Visit: Payer: Medicare Other | Attending: Family Medicine

## 2020-03-10 DIAGNOSIS — I69354 Hemiplegia and hemiparesis following cerebral infarction affecting left non-dominant side: Secondary | ICD-10-CM | POA: Insufficient documentation

## 2020-03-10 DIAGNOSIS — R262 Difficulty in walking, not elsewhere classified: Secondary | ICD-10-CM

## 2020-03-10 DIAGNOSIS — R278 Other lack of coordination: Secondary | ICD-10-CM | POA: Diagnosis present

## 2020-03-10 DIAGNOSIS — R296 Repeated falls: Secondary | ICD-10-CM | POA: Insufficient documentation

## 2020-03-10 DIAGNOSIS — M6281 Muscle weakness (generalized): Secondary | ICD-10-CM

## 2020-03-11 ENCOUNTER — Ambulatory Visit: Payer: Medicare Other | Admitting: Occupational Therapy

## 2020-03-11 ENCOUNTER — Ambulatory Visit: Payer: Medicare Other

## 2020-03-11 ENCOUNTER — Other Ambulatory Visit: Payer: Self-pay

## 2020-03-11 ENCOUNTER — Encounter: Payer: Self-pay | Admitting: Occupational Therapy

## 2020-03-11 DIAGNOSIS — R278 Other lack of coordination: Secondary | ICD-10-CM

## 2020-03-11 DIAGNOSIS — M6281 Muscle weakness (generalized): Secondary | ICD-10-CM

## 2020-03-11 DIAGNOSIS — R262 Difficulty in walking, not elsewhere classified: Secondary | ICD-10-CM

## 2020-03-11 DIAGNOSIS — I69354 Hemiplegia and hemiparesis following cerebral infarction affecting left non-dominant side: Secondary | ICD-10-CM

## 2020-03-11 DIAGNOSIS — R296 Repeated falls: Secondary | ICD-10-CM

## 2020-03-11 NOTE — Therapy (Signed)
[place] MAIN [facility] SERVICES [address] [place], [place], [address] Phone: [phone]   Fax:  [phone]  Occupational Therapy Progress Note  Dates of reporting period  01/26/2020   to   03/11/2020  Patient Details  Name: Kenneth Summers MRN: [id number] Date of Birth: May 22, 1945 No data recorded  Encounter Date: 03/11/2020   OT End of Session - 03/11/20 1314    Visit Number [id number]    Number of Visits 205    Date for OT Re-Evaluation 05/24/20    Authorization Type Progress report period starting 01/26/2020    OT Start Time 1300    OT Stop Time 1345    OT Time Calculation (min) 45 min    Activity Tolerance Patient tolerated treatment well    Behavior During Therapy [facility] for tasks assessed/performed           Past Medical History:  Diagnosis Date  . Actinic keratosis   . Alcohol abuse   . APS (antiphospholipid syndrome) ([place])   . Basal cell carcinoma   . BPH (benign prostatic hyperplasia)   . CAD (coronary artery disease)   . Cellulitis   . Cervical spinal stenosis   . Chronic osteomyelitis ([place])   . CKD (chronic kidney disease)   . CKD (chronic kidney disease)   . Clostridium difficile diarrhea   . Depression   . Diplopia   . Fatigue   . GERD (gastroesophageal reflux disease)   . Hyperlipemia   . Hypertension   . Hypovitaminosis D   . IBS (irritable bowel syndrome)   . 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 Insomnia   . Left lumbosacral radiculopathy   . Moderate obstructive sleep apnea   . Osteomyelitis of foot ([place])   . PAD (peripheral artery disease) ([place])   . Pruritus   . RA (rheumatoid arthritis) ([place])   . Radiculopathy   . Restless legs syndrome   . RLS (restless legs syndrome)   . SLE (systemic lupus erythematosus related syndrome) ([place])   . 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 Stroke ([place])   . Toxic maculopathy of both eyes     Past Surgical History:  Procedure Laterality Date   . CARDIAC CATHETERIZATION    . CAROTID ENDARTERECTOMY    . CERVICAL LAMINECTOMY    . CORONARY ANGIOPLASTY    . FRACTURE SURGERY    . fusion C5-6-7    . HERNIA REPAIR    . KNEE ARTHROSCOPY    . TONSILLECTOMY      There were no vitals filed for this visit.   Subjective Assessment - 03/11/20 1313    Subjective  Pt. reports that he is doing well    Patient is accompanied by: Family member    Pertinent History Pt. is a 75 y.o. 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 Pt. PMHx includes: Multiple Falls, Lupus, DJD, Renal Abscess, CVA. Pt. resides with his wife. Pt.'s wife and daughter assist with ADLs. Pt. has caregivers in  for 2 hours a day, 6 days a week. Pt. received Rehab services in acute care, at SNF for STR, and [place] services. Pt. is retired from [employer] for [employer] and [employer].    Currently in Pain? No/denies          OT TREATMENT   Therapeutic Exercise:  Pt. performed2.5#dowelfor UE strengthening secondary to weakness.Pt.tolerated increased dowel weight.Bilateral shoulder flexion, chest  press,andcircular patterns were performedfor1-2 sets 20 reps each.Pt. worked on3# dumbbell ex. forbilateralelbow flexion and extension, 3# for right and  2# left. Pt. perfromedforearm supination/pronation, wrist flexion/extension 3# for the RUE, and 2# with the left for1 set of10-20and radial deviation 1 set 20 reps.  Pt.continuestomake progress with BUE strength, and tolerance for there. Ex.Pt.continues to present with weakness, as well asimpaired motor control, and motor planning.Pt. tolerated BUE exercises well, with no reportsofdiscomfort. Pt. required consistent verbal, visual, and tactile cues. Pt.continues torequire less cuesto perform UE strengthening exercises, technique, and formas well as visual demonstration prior to the exercises.Pt. continues to work on improving BUE strength, and [facility] skills in  order to improve LUE functioning during ADLs, IADLs, and tranfers.                        OT Education - 03/11/20 1314    Education Details strengthening, UE functioning    Person(s) Educated Patient    Methods Explanation;Demonstration    Comprehension Returned demonstration;Tactile cues required;Verbalized understanding;Need further instruction               OT Long Term Goals - 03/11/20 1315      OT LONG TERM GOAL #1   Title Pt. will increase left shoulder flexion AROM by 10 degrees to access cabinet/shelf.    Baseline Pt. conitnues to present with limited left shoulder ROM limitng access to cabinets, and shelves.03/01/2020: Left shoulder 116 degrees with new onset of numbness radiating from the left side of the neck to the hand. Pt. continues to progress with left shoulder ROM .Shouler flexion 130, and continues to work on reaching with increasing emphasis on flexing trunk to improve functional reach.  07/21/19- shoulder flexion to 130 with effort    Time 12    Period Weeks    Status On-going    Target Date 05/24/20      OT LONG TERM GOAL #2   Title Pt. will donn a shirt with Supervision.    Baseline Pt. continues to require modA donning a zip down jacket with consistent cues to initiate the correct direction of the jacket.  Min-modA mahaging the zipper. MinA for donning a T-shirt with assist to roll side to side to pull the shirt down over his back. 07/21/19 continues to require min assist at times for shirt/jacket    Time 12    Period Weeks    Status On-going    Target Date 05/24/20      OT LONG TERM GOAL #4   Title Pt. will improve left grip strength by 10# to be able to open a jar/container.    Baseline Grip strength conitnues to be limited. Decreased left sided grip strength today secondary to a new onset of numbenss, and weakness radiating from the neck to the left hand. Pt. continues to have a harder time opening a wide mouthed jar, however is able to  open a smaller jar easier.    Time 12    Period Weeks    Status On-going    Target Date 05/24/20      OT LONG TERM GOAL #5   Title Pt. will improve left hand [facility] skills to be able to assist with buttoning/zipping.    Baseline Pt. conitnues to work on improving [facility] skills  to improve buttoning , and zipping.03/01/2020: Pt.has had a new onset of weakness, and numbness in the left hand beginning upon waking up this morning which has resulted in a  significant change in [facility] skills.  Pt. has improved overall donning a jacket, and  manipulating the zipper on his jacket, however continues to require practice depending on the type of zipper jacket. Pt. continues to work on improving bilateral [facility] skills for buttoning/zipping.  07/21/19 improving with buttons and zippers.    Time 12    Period Weeks    Status On-going    Target Date 05/24/20      OT LONG TERM GOAL #6   Title Pt. will demonstrate visual conmpensatory strategies for 100% of the time during ADLs    Baseline Improving, however Pt. continues to require intermittent cues cues for left sides awareness.    Time 12    Period Weeks    Status On-going      OT LONG TERM GOAL #8   Title Pt. will accurately identify potential safety hazard using good safety awareness, and judgement 100% for ADLs, and IADLs.    Baseline Pt. continues to require cues    Time 12    Period Weeks    Status On-going    Target Date 05/24/20      OT LONG TERM GOAL  #9   TITLE Pt. will  navigate his w/c around obstacles with Supervision and 100% accuracy    Baseline Pt. continues to require cuing for obstacles on the left, however requires less cues overall. Pt. requires significant cues to engage his left UE during tasks. 07/21/2019 continues to improve with decreased assist at times    Time 12    Period Weeks    Status On-going    Target Date 05/24/20      OT LONG TERM GOAL  #10   TITLE Pt. will independently be able  to reach to place items into cabinetry, and  closets.    Baseline Pt. continues to have difficulty with functional reaching conitnues to be limited.    Time 12    Period Weeks    Status On-going    Target Date 05/24/20      OT LONG TERM GOAL  #11   TITLE Pt. will increase BUE strength by 61mm grades in preparation for ADL transfers.    Baseline Pt. BUE strength continues to be limited. Pt. is able to tolerate increased resistive weights. Pt. continues to work towards improving overall strength for ADLs.    Time 12    Period Weeks    Status On-going    Target Date 05/24/20      OT LONG TERM GOAL  #12   TITLE Pt. will improve LUE functional reaching with the left engaging the trunk 100% of the time during ADL tasks with minimal cues.    Baseline LUE numbness has improved. New onset of left sided numbness, and weakness is affecting his ability to perform reaching.    Time 12    Period Weeks    Status On-going    Target Date 05/24/20                 Plan - 03/11/20 1314    Clinical Impression Statement Pt.continuestomake progress with BUE strength, and tolerance for there. Ex.Pt.continues to present with weakness, as well asimpaired motor control, and motor planning.Pt. tolerated BUE exercises well, with no reportsofdiscomfort. Pt. required consistent verbal, visual, and tactile cues. Pt.continues torequire less cuesto perform UE strengthening exercises, technique, and formas well as visual demonstration prior to the exercises.Pt. continues to work on improving BUE strength, and [facility] skills in order to improve LUE functioning  during ADLs, IADLs, and tranfers.    OT Occupational Profile and History Problem Focused Assessment - Including review of records relating to presenting problem    Occupational performance deficits (Please refer to evaluation for details): ADL's    Body Structure / Function / Physical Skills UE functional use;Coordination;FMC;Dexterity;Strength;ROM    Cognitive Skills  Attention;Memory;Emotional;Problem Solve;Safety Awareness    Rehab Potential Good    Clinical Decision Making Several treatment options, min-mod task modification necessary    Comorbidities Affecting Occupational Performance: Presence of comorbidities impacting occupational performance    Comorbidities impacting occupational performance description: Phyical, cognitive, visual,  medical comorbidities    Modification or Assistance to Complete Evaluation  No modification of tasks or assist necessary to complete eval    OT Frequency 2x / week    OT Duration 12 weeks    OT Treatment/Interventions Self-care/ADL training;DME and/or AE instruction;Therapeutic exercise;Therapeutic activities;Moist Heat;Cognitive remediation/compensation;Neuromuscular education;Visual/perceptual remediation/compensation;Coping strategies training;Patient/family education;Passive range of motion;Psychosocial skills training;Energy conservation;Functional Mobility Training    Consulted and Agree with Plan of Care Patient;Family member/caregiver    Family Member Consulted daughter [name]           Patient will benefit from skilled therapeutic intervention in order to improve the following deficits and impairments:   Body Structure / Function / Physical Skills: UE functional use,Coordination,FMC,Dexterity,Strength,ROM Cognitive Skills: Attention,Memory,Emotional,Problem Solve,Safety Awareness     Visit Diagnosis: Muscle weakness (generalized)  Other lack of coordination    Problem List Patient Active Problem List   Diagnosis Date Noted  . Sepsis ([place]) 10/18/2018    Harrel Carina, MS, OTR/L 03/11/2020, 1:29 PM  [place] MAIN [facility] SERVICES [address] [place], [place], [address] Phone: [phone]   Fax:  [phone]  Name: Kenneth Summers MRN: [id number] Date of Birth: [birth year]

## 2020-03-11 NOTE — Therapy (Signed)
[place] MAIN [facility] SERVICES [address] [place], [place], [address] Phone: [phone]   Fax:  [phone]  Physical Therapy Treatment  Patient Details  Name: Kenneth Summers MRN: [id number] Date of Birth: [birth year] No data recorded  Encounter Date: 03/11/2020   PT End of Session - 03/11/20 1337    Visit Number [id number]    Number of Visits 252    Date for PT Re-Evaluation 04/19/20    Authorization Type Medicare reporting period starting 09/11/18; NO FOTO    Authorization Time Period goals last assessed 03/01/2020    PT Start Time 1345    PT Stop Time 1429    PT Time Calculation (min) 44 min    Equipment Utilized During Treatment Gait belt    Activity Tolerance No increased pain;Patient limited by fatigue;Patient tolerated treatment well    Behavior During Therapy [facility] for tasks assessed/performed           Past Medical History:  Diagnosis Date  . Actinic keratosis   . Alcohol abuse   . APS (antiphospholipid syndrome) ([place])   . Basal cell carcinoma   . BPH (benign prostatic hyperplasia)   . CAD (coronary artery disease)   . Cellulitis   . Cervical spinal stenosis   . Chronic osteomyelitis ([place])   . CKD (chronic kidney disease)   . CKD (chronic kidney disease)   . Clostridium difficile diarrhea   . Depression   . Diplopia   . Fatigue   . GERD (gastroesophageal reflux disease)   . Hyperlipemia   . Hypertension   . Hypovitaminosis D   . IBS (irritable bowel syndrome)   . 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 Insomnia   . Left lumbosacral radiculopathy   . Moderate obstructive sleep apnea   . Osteomyelitis of foot ([place])   . PAD (peripheral artery disease) ([place])   . Pruritus   . RA (rheumatoid arthritis) ([place])   . Radiculopathy   . Restless legs syndrome   . RLS (restless legs syndrome)   . SLE (systemic lupus erythematosus related syndrome) ([place])   . 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 Stroke  ([place])   . Toxic maculopathy of both eyes     Past Surgical History:  Procedure Laterality Date  . CARDIAC CATHETERIZATION    . CAROTID ENDARTERECTOMY    . CERVICAL LAMINECTOMY    . CORONARY ANGIOPLASTY    . FRACTURE SURGERY    . fusion C5-6-7    . HERNIA REPAIR    . KNEE ARTHROSCOPY    . TONSILLECTOMY      There were no vitals filed for this visit.   Subjective Assessment - 03/11/20 1336    Subjective Patient reports he was pleased with his visit yesterday and states    Patient is accompained by: Family member    Pertinent History Patient is a 75 y.o. male who presents to outpatient physical therapy with a referral for medical diagnosis of CVA. This patient's chief complaints consist of left hemiplegia and overall deconditioning leading to the following functional deficits: dependent for ADLs, IADLs, unable to transfer to car, dependent transfers at home with hoyer lift, unable to walk or stand without significant assistance, difficulty with W/C navigation..    Limitations Sitting;Lifting;Standing;House hold activities;Writing;Walking;Other (comment)    Diagnostic tests Brain MRI 11/01/2017: IMPRESSION: 1. Acute right ACA territory infarct as demonstrated on prior CT imaging. No intracranial hemorrhage or significant mass effect.  2. 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    Patient Stated Goals wants to be able to walk again    Currently in Pain? No/denies    Pain Onset Today    Pain Onset Today               Treatment:  Patient performed seated LE kicks- attempting to kick over colored cones with left then right LE- Patient initially difficulty focusing but did improve with practice.  Seated hip march  Seated hip abd/flex- right LE lifting foot over orange hurdle and AAROM on left LE  Neuro re-education:  Static standing in //bars: Mod assist to stand today and less verbal cues to stand erect, using mirror for feedback.   4 trials  today-  1) 1 min 42  sec- Only Mod A and left knee blocked today-  2) 45 sec - only Mod A and left knee blocked to maintain standing.  3)  40 sec-  Mod A  With patient able to maintain more erect posture for approx 15 sec at a time. 4) 20 sec- using //bars  - max A to extend hips- Patient took a small step forward with right LE yet unable to pull leg back to stand erect requiring more assist from stand to sit.   Pt educated throughout session about proper posture and technique with exercises. Improved exercise technique, movement at target joints, use of target muscles after min to mod verbal, visual, tactile cues.                          PT Short Term Goals - 01/26/20 1308      PT SHORT TERM GOAL #1   Title Be independent with initial home exercise program for self-management of symptoms.    Baseline HEP to be given at second session; advice to practice unsupported sitting at home (06/19/2018); 07/08/18: Performing at home, 6/29: needs assistance but is doing exercise program regularly; 08/22/18: adherent;    Time 2    Period Weeks    Status Achieved    Target Date 06/26/18             PT Long Term Goals - 03/01/20 1624      PT LONG TERM GOAL #1   Title Patient will complete all bed mobility with min A to improve functional independence for getting in and out of bed and adjusting in bed.     Baseline max A - total A reported by family (06/19/2018); 07/08/18: still requires maxA, dtr reports improvement in rolling since starting therapy; 6/23: min A to supervision in clinic; not doing at home right; 8/5: Pt's daughter states that his rolling has improved, but still has difficulty with all other bed mobility; 10/23/18: pts daughter states that she is doing about 35% of the work if he is in proper position, he does uses the bed rails to help roll;  using Harrel Lemon for coming to sit EOB; 10/12: pts daughter states that she is doing about 30% of the work if he is in proper  position, he does uses the bed rails to help roll, but it is improving;  using Harrel Lemon for coming to sit EOB, 10/29: min A for sit to sidelying, rolling supervision, able to transition sidelying to sit with HHA; 12/21: min-mod A. 03/01/2020- Patient presents improving bed mobility with current ability to roll to left with CGA and verbal and tactile cues and min  assist to roll to right as seen by recent visits.    Time 12    Period Weeks    Status On-going    Target Date 04/26/20      PT LONG TERM GOAL #2   Title Patient will complete sit <> stand transfer chair to chair with Min A and LRAD to improve functional independence for household and community mobility.     Baseline min A for STS, still requires elevated surface height to 27".  03/01/20- Patient currently unable to perform a chair to chair transfer and continues to be transferred using hoyer lift. Patient has been training with sit to stand at parallel bars and able to currently perform sit to stand with max assist  from 27in. high surface.    Time 12    Period Weeks    Status On-going    Target Date 04/26/20      PT LONG TERM GOAL #3   Title Patient will navigate power w/c with min A x 100 feet to improve mobility for household and short community distances.    Baseline required total A (06/12/2018); able to roll 20 feet with min A (06/20/2018); 07/08/18: Pt can go approximately 10' without assist per family;  10/23/2018: pt's daughter states that his power WC mobility has become 100% better with the new hand control, slowed speed, and with practice; 10/12: pt's daughter reports that he can perform household WC mobility and limited community ambulation with minA    Time 12    Period Weeks    Status Achieved      PT LONG TERM GOAL #4   Title Patient will complete car transfers W/C to family SUV with min A using LRAD to improve his abilty to participate in community activities.     Baseline unable to complete car transfers at all (06/12/2018);  unable to complete car transfers at all (06/19/2018).; 07/08/18: Unable to perform at this time; 09/11/18: unable to perform at this time; 10/23/2018: unable to complete car transfers at all; 10/12:  unable to complete car transfers at all, 10/29: unable; 01/13/19: unable; 2/11: min-mod A +2, 3/10: min A+1, 12/21: mod A. 03/01/2020- Patient unable to complete car transfers and continues to require medical transportation at this time.    Time 12    Period Weeks      PT LONG TERM GOAL #5   Title Patient will ambulate at least 10 feet with min A using LRAD to improve mobility for household and community distances.    Baseline unable to take steps (06/12/2018); able to shift R leg forward and back with max A and RW at edge of plinth (06/19/2018); 07/08/18: unable to ambulate at this time. 09/11/18: unable to perform at this time; 10/23/2018: unable to perform at this time; 10/12: unable to perform at this time, 10/29: unable at this time; 01/13/19: unable at this time, 2/11: min A to step with RLE, unable to step with LLE, 3/10: able to take 2 steps in parallel bars with min A for safety; 4/1: min A for walking 10 feet in parallel bars, 5/24: min A walking 10 feet in parallel bars; 6/21: no change 7/14: no change. 03/01/2020- Patient unable to ambulate and currently focusing on static standing. He was able to take a forward step with his right LE last week but unable today.    Time 12    Period Weeks    Status Deferred      Additional Long Term Goals  Additional Long Term Goals Yes      PT LONG TERM GOAL #6   Title Patient will be able to safety navigate up/down ramp with power chair, exhibiting good safety awareness, mod I to safely enter/exit his home.    Baseline 2/11: supervision with intermittent min A;4/1: supervision; 5/24: supervision. 03/01/2020- patient reports that he is able to navigate down the ramp at home to leave the home but continues to need assist to navigate up the ramp into the home.    Time 12     Period Weeks    Status Partially Met      PT LONG TERM GOAL #7   Title Patient will increase functional reach test to >15 inches in sitting to exhibit improved sitting balance and positioning and reduce fall risk;    Baseline 07/30/18: 12 inches; 09/11/18: 10-12 inches; 10/23/18: 12-13 inch; 10/12: 14.5", 10/29: 15.5 inch    Time 4    Period Weeks    Status Achieved      PT LONG TERM GOAL #8   Title Patient will increase LLE quad strength to 3+/5 to improve functional strength for standing and mobility;    Baseline 10/28: 2+/5; 01/13/19: 2+/5, 2/11: 3-/5, 04/16/19: 3-/5, 05/08/19: 3-/5, 5/24: 3-/5, 6/21: 3-/5, 7/14: 3-/5, 12/21: 3-/5. 03/01/2020- Left quad= 3-/5 (focusing on quad strengthening during visits and with home program).    Time 12    Period Weeks    Status On-going      PT LONG TERM GOAL  #9   TITLE Patient will report no vertigo with provoking motions or positions in order to be able to perform ADLs and mobility tasks without symptoms.    Baseline 06/30/19: reports no dizziness in last week; 6/21: one episode this weekend; 7/14: experiencing one a week; 03/01/2020.- Patient has denied any dizziness for over past 3 weeks.    Time 12    Period Weeks    Status Achieved      PT LONG TERM GOAL  #10   TITLE Patient will demonstrate improved functional LE/Postural strength as seen by ability to perform static standing for 2 min or greater with max assist  to assist with strength required for ADL's and transfers/pregait/gait activities.    Baseline 03/01/2020- Patient able to stand approx 40 sec today with max assist in parallel bars.    Time 8    Period Weeks    Target Date 04/26/20                 Plan - 03/11/20 1453    Clinical Impression Statement Patient continues to progress with standing with less physical assistance. He experienced much improved sit to stand abilty with decreased overall physical assistance. He was also more focused and able to stand with more erect  posture. He will continue to benefit from skilled PT services to improve his functional strength and postural control for improved transfers/standing endurance.    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    Examination-Activity Limitations Bed Mobility;Bend;Sit;Toileting;Stand;Stairs;Lift;Transfers;Squat;Locomotion Level;Carry;Dressing;Hygiene/Grooming;Continence    Examination-Participation Restrictions Yard Work;Interpersonal Relationship;Community Activity    Stability/Clinical Decision Making  Evolving/Moderate complexity    Rehab Potential Fair    PT Frequency 3x / week    PT Duration 12 weeks    PT Treatment/Interventions ADLs/Self Care Home Management;Electrical Stimulation;Moist Heat;Cryotherapy;Gait training;Stair training;Functional mobility training;Therapeutic exercise;Balance training;Neuromuscular re-education;Cognitive remediation;Patient/family education;Orthotic Fit/Training;Wheelchair mobility training;Manual techniques;Passive range of motion;Energy conservation;Joint Manipulations;Canalith Repostioning;Therapeutic activities;Vestibular    PT Next Visit Plan Continue with progressive postural/LE strengthening.    PT Home Exercise Plan no updates this session    Consulted and Agree with Plan of Care Patient;Family member/caregiver    Family Member Consulted Caregiver- [name]           Patient will benefit from skilled therapeutic intervention  in order to improve the following deficits and impairments:  Abnormal gait,Decreased activity tolerance,Decreased cognition,Decreased endurance,Decreased knowledge of use of DME,Decreased range of motion,Decreased skin integrity,Decreased strength,Impaired perceived functional ability,Impaired sensation,Impaired UE functional use,Improper body mechanics,Pain,Cardiopulmonary status limiting activity,Decreased balance,Decreased coordination,Decreased mobility,Difficulty walking,Impaired tone,Postural dysfunction,Dizziness  Visit Diagnosis: Muscle weakness (generalized)  Other lack of coordination  Difficulty in walking, not elsewhere classified  Repeated falls  Hemiplegia and hemiparesis following cerebral infarction affecting left non-dominant side [facility])     Problem List Patient Active Problem List   Diagnosis Date Noted  . Sepsis ([place]) 10/18/2018    Lewis Moccasin, PT 03/11/2020, 5:06 PM  [place] MAIN [facility] SERVICES [address] [place], [place], [address] Phone: [phone]   Fax:  [phone]  Name: Kenneth Summers MRN: [id number] Date of Birth: [birth year]

## 2020-03-11 NOTE — Therapy (Signed)
[facility] [address] [place], [place], [address] Phone: [phone]   Fax:  [phone]  Physical Therapy Treatment  Patient Details  Name: Kenneth Summers MRN: [id number] Date of Birth: 05/06/1945 No data recorded  Encounter Date: 03/10/2020   PT End of Session - 03/11/20 0914    Visit Number [id number]    Number of Visits 252    Date for PT Re-Evaluation 04/19/20    Authorization Type Medicare reporting period starting 09/11/18; NO FOTO    Authorization Time Period goals last assessed 03/01/2020    PT Start Time 1300    PT Stop Time 1344    PT Time Calculation (min) 44 min    Equipment Utilized During Treatment Gait belt    Activity Tolerance No increased pain;Patient limited by fatigue;Patient tolerated treatment well    Behavior During Therapy WFL for tasks assessed/performed           Past Medical History:  Diagnosis Date  . Actinic keratosis   . Alcohol abuse   . APS (antiphospholipid syndrome) (HCC)   . Basal cell carcinoma   . BPH (benign prostatic hyperplasia)   . CAD (coronary artery disease)   . Cellulitis   . Cervical spinal stenosis   . Chronic osteomyelitis (HCC)   . CKD (chronic kidney disease)   . CKD (chronic kidney disease)   . Clostridium difficile diarrhea   . Depression   . Diplopia   . Fatigue   . GERD (gastroesophageal reflux disease)   . Hyperlipemia   . Hypertension   . Hypovitaminosis D   . IBS (irritable bowel syndrome)   . IgA deficiency (HCC)   . Insomnia   . Left lumbosacral radiculopathy   . Moderate obstructive sleep apnea   . Osteomyelitis of foot (HCC)   . PAD (peripheral artery disease) (HCC)   . Pruritus   . RA (rheumatoid arthritis) (HCC)   . Radiculopathy   . Restless legs syndrome   . RLS (restless legs syndrome)   . SLE (systemic lupus erythematosus related syndrome) (HCC)   . 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 Stroke  (HCC)   . Toxic maculopathy of both eyes     Past Surgical History:  Procedure Laterality Date  . CARDIAC CATHETERIZATION    . CAROTID ENDARTERECTOMY    . CERVICAL LAMINECTOMY    . CORONARY ANGIOPLASTY    . FRACTURE SURGERY    . fusion C5-6-7    . HERNIA REPAIR    . KNEE ARTHROSCOPY    . TONSILLECTOMY      There were no vitals filed for this visit.   Subjective Assessment - 03/10/20 1304    Subjective Patient reports no new complaints. Denies any pain or changes in meds.    Patient is accompained by: Family member    Pertinent History Patient is a 75 y.o. male who presents to outpatient physical therapy with a referral for medical diagnosis of CVA. This patient's chief complaints consist of left hemiplegia and overall deconditioning leading to the following functional deficits: dependent for ADLs, IADLs, unable to transfer to car, dependent transfers at home with hoyer lift, unable to walk or stand without significant assistance, difficulty with W/C navigation..    Limitations Sitting;Lifting;Standing;House hold activities;Writing;Walking;Other (comment)    Diagnostic tests Brain MRI 11/01/2017: IMPRESSION: 1. Acute right ACA territory infarct as demonstrated on prior CT imaging. No intracranial hemorrhage or significant mass   effect. 2. 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    Patient Stated Goals wants to be able to walk again    Currently in Pain? No/denies    Pain Onset Today    Pain Onset Today                  Treatment:  Neuro re-education:  Static sitting in power chair with patient sitting upright (attempting no back support) Dynamic UE reaching at varying angles x 1 min x 3 sets.  Dynamic reaching forward for strategically placed cones and progressively leaning forward to stack cones (6) x 5 trials - Focusing on leaning forward for increased abdominal contraction.   Static standing in //bars: Max assist to stand. Mod A to  Max A to maintain standing with constant verbal cues to stand erect, postural verbal/tactile cues.   4 trials today-  1) 45  sec- Only Mod A and left knee blocked today- less physical assist 2) 40 sec - using SW inside of bars to attempt to use walker vs. Bars however patient unable to stand up as straight. Max A of 2 people (PT and Caregiver- [place]) 3) 45 sec-  Using RW- MOD A x 2  4) 55 sec- using //bars  - max A to extend hips- Patient reported very fatigued upon completion.  Pt educated throughout session about proper posture and technique with exercises. Improved exercise technique, movement at target joints, use of target muscles after min to mod verbal, visual, tactile cues.                                                     PT Education - 03/11/20 0913    Education Details Postural cues and education in exercise technique    Person(s) Educated Patient    Methods Explanation;Demonstration;Tactile cues;Verbal cues    Comprehension Verbalized understanding;Returned demonstration;Tactile cues required;Verbal cues required;Need further instruction            PT Short Term Goals - 01/26/20 1308      PT SHORT TERM GOAL #1   Title Be independent with initial home exercise program for self-management of symptoms.    Baseline HEP to be given at second session; advice to practice unsupported sitting at home (06/19/2018); 07/08/18: Performing at home, 6/29: needs assistance but is doing exercise program regularly; 08/22/18: adherent;    Time 2    Period Weeks    Status Achieved    Target Date 06/26/18             PT Long Term Goals - 03/01/20 1624      PT LONG TERM GOAL #1   Title Patient will complete all bed mobility with min A to improve functional independence for getting in and out of bed and adjusting in bed.     Baseline max A - total A reported by family (06/19/2018); 07/08/18: still requires maxA, dtr reports improvement in  rolling since starting therapy; 6/23: min A to supervision in clinic; not doing at home right; 8/5: Pt's daughter states that his rolling has improved, but still has difficulty with all other bed mobility; 10/23/18: pts daughter states that she is doing about 35% of the work if he is in proper position, he does uses the bed rails to help roll;  using Hoyer for   coming to sit EOB; 10/12: pts daughter states that she is doing about 30% of the work if he is in proper position, he does uses the bed rails to help roll, but it is improving;  using Harrel Lemon for coming to sit EOB, 10/29: min A for sit to sidelying, rolling supervision, able to transition sidelying to sit with HHA; 12/21: min-mod A. 03/01/2020- Patient presents improving bed mobility with current ability to roll to left with CGA and verbal and tactile cues and min assist to roll to right as seen by recent visits.    Time 12    Period Weeks    Status On-going    Target Date 04/26/20      PT LONG TERM GOAL #2   Title Patient will complete sit <> stand transfer chair to chair with Min A and LRAD to improve functional independence for household and community mobility.     Baseline min A for STS, still requires elevated surface height to 27".  03/01/20- Patient currently unable to perform a chair to chair transfer and continues to be transferred using hoyer lift. Patient has been training with sit to stand at parallel bars and able to currently perform sit to stand with max assist  from 27in. high surface.    Time 12    Period Weeks    Status On-going    Target Date 04/26/20      PT LONG TERM GOAL #3   Title Patient will navigate power w/c with min A x 100 feet to improve mobility for household and short community distances.    Baseline required total A (06/12/2018); able to roll 20 feet with min A (06/20/2018); 07/08/18: Pt can go approximately 10' without assist per family;  10/23/2018: pt's daughter states that his power WC mobility has become 100% better  with the new hand control, slowed speed, and with practice; 10/12: pt's daughter reports that he can perform household WC mobility and limited community ambulation with minA    Time 12    Period Weeks    Status Achieved      PT LONG TERM GOAL #4   Title Patient will complete car transfers W/C to family SUV with min A using LRAD to improve his abilty to participate in community activities.     Baseline unable to complete car transfers at all (06/12/2018); unable to complete car transfers at all (06/19/2018).; 07/08/18: Unable to perform at this time; 09/11/18: unable to perform at this time; 10/23/2018: unable to complete car transfers at all; 10/12:  unable to complete car transfers at all, 10/29: unable; 01/13/19: unable; 2/11: min-mod A +2, 3/10: min A+1, 12/21: mod A. 03/01/2020- Patient unable to complete car transfers and continues to require medical transportation at this time.    Time 12    Period Weeks      PT LONG TERM GOAL #5   Title Patient will ambulate at least 10 feet with min A using LRAD to improve mobility for household and community distances.    Baseline unable to take steps (06/12/2018); able to shift R leg forward and back with max A and RW at edge of plinth (06/19/2018); 07/08/18: unable to ambulate at this time. 09/11/18: unable to perform at this time; 10/23/2018: unable to perform at this time; 10/12: unable to perform at this time, 10/29: unable at this time; 01/13/19: unable at this time, 2/11: min A to step with RLE, unable to step with LLE, 3/10: able to take 2 steps in  parallel bars with min A for safety; 4/1: min A for walking 10 feet in parallel bars, 5/24: min A walking 10 feet in parallel bars; 6/21: no change 7/14: no change. 03/01/2020- Patient unable to ambulate and currently focusing on static standing. He was able to take a forward step with his right LE last week but unable today.    Time 12    Period Weeks    Status Deferred      Additional Long Term Goals   Additional Long  Term Goals Yes      PT LONG TERM GOAL #6   Title Patient will be able to safety navigate up/down ramp with power chair, exhibiting good safety awareness, mod I to safely enter/exit his home.    Baseline 2/11: supervision with intermittent min A;4/1: supervision; 5/24: supervision. 03/01/2020- patient reports that he is able to navigate down the ramp at home to leave the home but continues to need assist to navigate up the ramp into the home.    Time 12    Period Weeks    Status Partially Met      PT LONG TERM GOAL #7   Title Patient will increase functional reach test to >15 inches in sitting to exhibit improved sitting balance and positioning and reduce fall risk;    Baseline 07/30/18: 12 inches; 09/11/18: 10-12 inches; 10/23/18: 12-13 inch; 10/12: 14.5", 10/29: 15.5 inch    Time 4    Period Weeks    Status Achieved      PT LONG TERM GOAL #8   Title Patient will increase LLE quad strength to 3+/5 to improve functional strength for standing and mobility;    Baseline 10/28: 2+/5; 01/13/19: 2+/5, 2/11: 3-/5, 04/16/19: 3-/5, 05/08/19: 3-/5, 5/24: 3-/5, 6/21: 3-/5, 7/14: 3-/5, 12/21: 3-/5. 03/01/2020- Left quad= 3-/5 (focusing on quad strengthening during visits and with home program).    Time 12    Period Weeks    Status On-going      PT LONG TERM GOAL  #9   TITLE Patient will report no vertigo with provoking motions or positions in order to be able to perform ADLs and mobility tasks without symptoms.    Baseline 06/30/19: reports no dizziness in last week; 6/21: one episode this weekend; 7/14: experiencing one a week; 03/01/2020.- Patient has denied any dizziness for over past 3 weeks.    Time 12    Period Weeks    Status Achieved      PT LONG TERM GOAL  #10   TITLE Patient will demonstrate improved functional LE/Postural strength as seen by ability to perform static standing for 2 min or greater with max assist  to assist with strength required for ADL's and transfers/pregait/gait activities.     Baseline 03/01/2020- Patient able to stand approx 40 sec today with max assist in parallel bars.    Time 8    Period Weeks    Target Date 04/26/20                 Plan - 03/11/20 1116    Clinical Impression Statement Patient attempted standing using Walker rather than // bars today - increased difficulty maintaining upright posture so reverted back to bars. Patient demo improving trunk control today with less Physical assistance versus recent previous visits. He will continue to benefit from skilled PT services to improve his overall standing endurance/strength and quality of life.    Personal Factors and Comorbidities Age;Comorbidity 3+    Comorbidities Relevant past medical   history and comorbidities include long term steroid use for lupus, CKD, chronic osteomyelitis, cervical spine stenosis, BPH, APS, alcohol abuse, peripheral artery disease, depression, GERD, obstructive sleep apnea, HTN, IBS, IgA deficiency, lumbar radiculopathy, RA, Stroke, toxic maculopathy in both eyes, systemic lupus erythematosus related syndrome, cardiac catheterization, carotid endarterectomy, cervical laminectomy, coronary angioplasty, Fusion C5-C7, knee arthroplasty, lumbar surgery.    Examination-Activity Limitations Bed Mobility;Bend;Sit;Toileting;Stand;Stairs;Lift;Transfers;Squat;Locomotion Level;Carry;Dressing;Hygiene/Grooming;Continence    Examination-Participation Restrictions Yard Work;Interpersonal Relationship;Community Activity    Stability/Clinical Decision Making Evolving/Moderate complexity    Rehab Potential Fair    PT Frequency 3x / week    PT Duration 12 weeks    PT Treatment/Interventions ADLs/Self Care Home Management;Electrical Stimulation;Moist Heat;Cryotherapy;Gait training;Stair training;Functional mobility training;Therapeutic exercise;Balance training;Neuromuscular re-education;Cognitive remediation;Patient/family education;Orthotic Fit/Training;Wheelchair mobility training;Manual  techniques;Passive range of motion;Energy conservation;Joint Manipulations;Canalith Repostioning;Therapeutic activities;Vestibular    PT Next Visit Plan Continue with progressive postural/LE strengthening.    PT Home Exercise Plan no updates this session    Consulted and Agree with Plan of Care Patient;Family member/caregiver    Family Member Consulted Caregiver- [place]           Patient will benefit from skilled therapeutic intervention in order to improve the following deficits and impairments:  Abnormal gait,Decreased activity tolerance,Decreased cognition,Decreased endurance,Decreased knowledge of use of DME,Decreased range of motion,Decreased skin integrity,Decreased strength,Impaired perceived functional ability,Impaired sensation,Impaired UE functional use,Improper body mechanics,Pain,Cardiopulmonary status limiting activity,Decreased balance,Decreased coordination,Decreased mobility,Difficulty walking,Impaired tone,Postural dysfunction,Dizziness  Visit Diagnosis: Muscle weakness (generalized)  Other lack of coordination  Difficulty in walking, not elsewhere classified  Repeated falls  Hemiplegia and hemiparesis following cerebral infarction affecting left non-dominant side [facility])     Problem List Patient Active Problem List   Diagnosis Date Noted  . Sepsis (Central) 10/18/2018    Lewis Moccasin, PT 03/11/2020, 11:34 AM  [place] [facility]IN [facility] SERVICES [address] [place], [place], [address] Phone: [phone]   Fax:  [phone]  Name: Kenneth Summers MRN: [id number] Date of Birth: 1945/09/29

## 2020-03-15 ENCOUNTER — Ambulatory Visit: Payer: Medicare Other | Admitting: Occupational Therapy

## 2020-03-15 ENCOUNTER — Ambulatory Visit: Payer: Medicare Other

## 2020-03-17 ENCOUNTER — Other Ambulatory Visit: Payer: Self-pay

## 2020-03-17 ENCOUNTER — Ambulatory Visit: Payer: Medicare Other

## 2020-03-17 DIAGNOSIS — M6281 Muscle weakness (generalized): Secondary | ICD-10-CM | POA: Diagnosis not present

## 2020-03-17 DIAGNOSIS — R296 Repeated falls: Secondary | ICD-10-CM

## 2020-03-17 DIAGNOSIS — I69354 Hemiplegia and hemiparesis following cerebral infarction affecting left non-dominant side: Secondary | ICD-10-CM

## 2020-03-17 DIAGNOSIS — R278 Other lack of coordination: Secondary | ICD-10-CM

## 2020-03-17 DIAGNOSIS — R262 Difficulty in walking, not elsewhere classified: Secondary | ICD-10-CM

## 2020-03-17 NOTE — Therapy (Signed)
[place] MAIN [facility] SERVICES [address] [place], [place], [address] Phone: [phone]   Fax:  [phone]  Physical Therapy Treatment  Patient Details  Name: Kenneth Summers MRN: [id number] Date of Birth: [birth year] No data recorded  Encounter Date: 03/17/2020   PT End of Session - 03/17/20 1251    Visit Number [id number]    Number of Visits 252    Date for PT Re-Evaluation 04/19/20    Authorization Type Medicare reporting period starting 09/11/18; NO FOTO    Authorization Time Period goals last assessed 03/01/2020    PT Start Time 1300    PT Stop Time 1341    PT Time Calculation (min) 41 min    Equipment Utilized During Treatment Gait belt    Activity Tolerance No increased pain;Patient limited by fatigue;Patient tolerated treatment well    Behavior During Therapy [facility] for tasks assessed/performed           Past Medical History:  Diagnosis Date  . Actinic keratosis   . Alcohol abuse   . APS (antiphospholipid syndrome) ([place])   . Basal cell carcinoma   . BPH (benign prostatic hyperplasia)   . CAD (coronary artery disease)   . Cellulitis   . Cervical spinal stenosis   . Chronic osteomyelitis ([place])   . CKD (chronic kidney disease)   . CKD (chronic kidney disease)   . Clostridium difficile diarrhea   . Depression   . Diplopia   . Fatigue   . GERD (gastroesophageal reflux disease)   . Hyperlipemia   . Hypertension   . Hypovitaminosis D   . IBS (irritable bowel syndrome)   . 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 Insomnia   . Left lumbosacral radiculopathy   . Moderate obstructive sleep apnea   . Osteomyelitis of foot ([place])   . PAD (peripheral artery disease) ([place])   . Pruritus   . RA (rheumatoid arthritis) ([place])   . Radiculopathy   . Restless legs syndrome   . RLS (restless legs syndrome)   . SLE (systemic lupus erythematosus related syndrome) ([place])   . 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 Stroke  ([place])   . Toxic maculopathy of both eyes     Past Surgical History:  Procedure Laterality Date  . CARDIAC CATHETERIZATION    . CAROTID ENDARTERECTOMY    . CERVICAL LAMINECTOMY    . CORONARY ANGIOPLASTY    . FRACTURE SURGERY    . fusion C5-6-7    . HERNIA REPAIR    . KNEE ARTHROSCOPY    . TONSILLECTOMY      There were no vitals filed for this visit.   Subjective Assessment - 03/17/20 1554    Subjective Patient reports he is feeling better than Monday- no more bleeding from scrotum.    Patient is accompained by: Family member    Pertinent History Patient is a 75 y.o. male who presents to outpatient physical therapy with a referral for medical diagnosis of CVA. This patient's chief complaints consist of left hemiplegia and overall deconditioning leading to the following functional deficits: dependent for ADLs, IADLs, unable to transfer to car, dependent transfers at home with hoyer lift, unable to walk or stand without significant assistance, difficulty with W/C navigation..    Limitations Sitting;Lifting;Standing;House hold activities;Writing;Walking;Other (comment)    Diagnostic tests Brain MRI 11/01/2017: IMPRESSION: 1. Acute right ACA territory infarct as demonstrated on prior CT imaging. No intracranial hemorrhage or significant  mass effect. 2. 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    Patient Stated Goals wants to be able to walk again    Currently in Pain? No/denies    Pain Onset Today    Pain Onset Today              Treatment:  Patient performed seated LE kicks- PT would roll soccer ball toward patient to left LE and right LE x 5 min.  Patient initially difficulty focusing but did improve with practice. Decreased concentration with minimal ability to kick left LE  Seated hip march with green theraband position in front of patient and he was instructed to lift his knee to touch the band- unable to lift Left LE more than 6 times without  brief rest.  Patient performed 10 reps altogether.  Forward leaning to neutral (upright posture) x 10 reps with verbal and visual cues to perform correctly. Bilateral UE lifting soccer ball from waist height to overhead maintaining upright posture- patient able to perform 2 sets of 10 reps today.    Neuro re-education:  Static standing in //bars: Max assist to stand today - using mirror for feedback.   3 trials today-  1) 20 sec- Patient unable to stand erect despite max cues- stated "Man I am weak overall." 2) 35 sec - Max  A and left knee blocked to maintain standing.  3)  55 sec-  Mod A  With patient able to maintain more erect posture for approx 8 sec at a time.  Pt educated throughout session about proper posture and technique with exercises. Improved exercise technique, movement at target joints, use of target muscles after min to mod verbal, visual, tactile cues.                                                PT Education - 03/17/20 1556    Education Details Postural cues and exercise technique    Person(s) Educated Patient    Methods Explanation;Demonstration;Tactile cues;Verbal cues    Comprehension Verbalized understanding;Returned demonstration;Tactile cues required;Need further instruction;Verbal cues required            PT Short Term Goals - 01/26/20 1308      PT SHORT TERM GOAL #1   Title Be independent with initial home exercise program for self-management of symptoms.    Baseline HEP to be given at second session; advice to practice unsupported sitting at home (06/19/2018); 07/08/18: Performing at home, 6/29: needs assistance but is doing exercise program regularly; 08/22/18: adherent;    Time 2    Period Weeks    Status Achieved    Target Date 06/26/18             PT Long Term Goals - 03/01/20 1624      PT LONG TERM GOAL #1   Title Patient will complete all bed mobility with min A to improve functional independence  for getting in and out of bed and adjusting in bed.     Baseline max A - total A reported by family (06/19/2018); 07/08/18: still requires maxA, dtr reports improvement in rolling since starting therapy; 6/23: min A to supervision in clinic; not doing at home right; 8/5: Pt's daughter states that his rolling has improved, but still has difficulty with all other bed mobility; 10/23/18: pts daughter states that  she is doing about 35% of the work if he is in proper position, he does uses the bed rails to help roll;  using Harrel Lemon for coming to sit EOB; 10/12: pts daughter states that she is doing about 30% of the work if he is in proper position, he does uses the bed rails to help roll, but it is improving;  using Harrel Lemon for coming to sit EOB, 10/29: min A for sit to sidelying, rolling supervision, able to transition sidelying to sit with HHA; 12/21: min-mod A. 03/01/2020- Patient presents improving bed mobility with current ability to roll to left with CGA and verbal and tactile cues and min assist to roll to right as seen by recent visits.    Time 12    Period Weeks    Status On-going    Target Date 04/26/20      PT LONG TERM GOAL #2   Title Patient will complete sit <> stand transfer chair to chair with Min A and LRAD to improve functional independence for household and community mobility.     Baseline min A for STS, still requires elevated surface height to 27".  03/01/20- Patient currently unable to perform a chair to chair transfer and continues to be transferred using hoyer lift. Patient has been training with sit to stand at parallel bars and able to currently perform sit to stand with max assist  from 27in. high surface.    Time 12    Period Weeks    Status On-going    Target Date 04/26/20      PT LONG TERM GOAL #3   Title Patient will navigate power w/c with min A x 100 feet to improve mobility for household and short community distances.    Baseline required total A (06/12/2018); able to roll 20 feet  with min A (06/20/2018); 07/08/18: Pt can go approximately 10' without assist per family;  10/23/2018: pt's daughter states that his power WC mobility has become 100% better with the new hand control, slowed speed, and with practice; 10/12: pt's daughter reports that he can perform household WC mobility and limited community ambulation with minA    Time 12    Period Weeks    Status Achieved      PT LONG TERM GOAL #4   Title Patient will complete car transfers W/C to family SUV with min A using LRAD to improve his abilty to participate in community activities.     Baseline unable to complete car transfers at all (06/12/2018); unable to complete car transfers at all (06/19/2018).; 07/08/18: Unable to perform at this time; 09/11/18: unable to perform at this time; 10/23/2018: unable to complete car transfers at all; 10/12:  unable to complete car transfers at all, 10/29: unable; 01/13/19: unable; 2/11: min-mod A +2, 3/10: min A+1, 12/21: mod A. 03/01/2020- Patient unable to complete car transfers and continues to require medical transportation at this time.    Time 12    Period Weeks      PT LONG TERM GOAL #5   Title Patient will ambulate at least 10 feet with min A using LRAD to improve mobility for household and community distances.    Baseline unable to take steps (06/12/2018); able to shift R leg forward and back with max A and RW at edge of plinth (06/19/2018); 07/08/18: unable to ambulate at this time. 09/11/18: unable to perform at this time; 10/23/2018: unable to perform at this time; 10/12: unable to perform at this time, 10/29: unable  at this time; 01/13/19: unable at this time, 2/11: min A to step with RLE, unable to step with LLE, 3/10: able to take 2 steps in parallel bars with min A for safety; 4/1: min A for walking 10 feet in parallel bars, 5/24: min A walking 10 feet in parallel bars; 6/21: no change 7/14: no change. 03/01/2020- Patient unable to ambulate and currently focusing on static standing. He was able  to take a forward step with his right LE last week but unable today.    Time 12    Period Weeks    Status Deferred      Additional Long Term Goals   Additional Long Term Goals Yes      PT LONG TERM GOAL #6   Title Patient will be able to safety navigate up/down ramp with power chair, exhibiting good safety awareness, mod I to safely enter/exit his home.    Baseline 2/11: supervision with intermittent min A;4/1: supervision; 5/24: supervision. 03/01/2020- patient reports that he is able to navigate down the ramp at home to leave the home but continues to need assist to navigate up the ramp into the home.    Time 12    Period Weeks    Status Partially Met      PT LONG TERM GOAL #7   Title Patient will increase functional reach test to >15 inches in sitting to exhibit improved sitting balance and positioning and reduce fall risk;    Baseline 07/30/18: 12 inches; 09/11/18: 10-12 inches; 10/23/18: 12-13 inch; 10/12: 14.5", 10/29: 15.5 inch    Time 4    Period Weeks    Status Achieved      PT LONG TERM GOAL #8   Title Patient will increase LLE quad strength to 3+/5 to improve functional strength for standing and mobility;    Baseline 10/28: 2+/5; 01/13/19: 2+/5, 2/11: 3-/5, 04/16/19: 3-/5, 05/08/19: 3-/5, 5/24: 3-/5, 6/21: 3-/5, 7/14: 3-/5, 12/21: 3-/5. 03/01/2020- Left quad= 3-/5 (focusing on quad strengthening during visits and with home program).    Time 12    Period Weeks    Status On-going      PT LONG TERM GOAL  #9   TITLE Patient will report no vertigo with provoking motions or positions in order to be able to perform ADLs and mobility tasks without symptoms.    Baseline 06/30/19: reports no dizziness in last week; 6/21: one episode this weekend; 7/14: experiencing one a week; 03/01/2020.- Patient has denied any dizziness for over past 3 weeks.    Time 12    Period Weeks    Status Achieved      PT LONG TERM GOAL  #10   TITLE Patient will demonstrate improved functional LE/Postural  strength as seen by ability to perform static standing for 2 min or greater with max assist  to assist with strength required for ADL's and transfers/pregait/gait activities.    Baseline 03/01/2020- Patient able to stand approx 40 sec today with max assist in parallel bars.    Time 8    Period Weeks    Target Date 04/26/20                 Plan - 03/17/20 1557    Clinical Impression Statement Patient presents with overall increased LE/trunk weakness/decreased functional endurance. He was unable to stand with the endurance he has been exhibiting over the past few visits. He was able to maintain upright posture with seated activities today with majority of just minima verbal  cues. He will continue to benefit from skilled PT services to improve his functional strength and postural control for improved transfers/standing endurance.    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    Examination-Activity Limitations Bed Mobility;Bend;Sit;Toileting;Stand;Stairs;Lift;Transfers;Squat;Locomotion Level;Carry;Dressing;Hygiene/Grooming;Continence    Examination-Participation Restrictions Yard Work;Interpersonal Relationship;Community Activity    Stability/Clinical Decision Making Evolving/Moderate complexity    Rehab Potential Fair    PT Frequency 3x / week    PT Duration 12 weeks    PT Treatment/Interventions ADLs/Self Care Home Management;Electrical Stimulation;Moist  Heat;Cryotherapy;Gait training;Stair training;Functional mobility training;Therapeutic exercise;Balance training;Neuromuscular re-education;Cognitive remediation;Patient/family education;Orthotic Fit/Training;Wheelchair mobility training;Manual techniques;Passive range of motion;Energy conservation;Joint Manipulations;Canalith Repostioning;Therapeutic activities;Vestibular    PT Next Visit Plan Continue with progressive postural/LE strengthening.    PT Home Exercise Plan no updates this session    Consulted and Agree with Plan of Care Patient;Family member/caregiver    Family Member Consulted Caregiver- [place]           Patient will benefit from skilled therapeutic intervention in order to improve the following deficits and impairments:  Abnormal gait,Decreased activity tolerance,Decreased cognition,Decreased endurance,Decreased knowledge of use of DME,Decreased range of motion,Decreased skin integrity,Decreased strength,Impaired perceived functional ability,Impaired sensation,Impaired UE functional use,Improper body mechanics,Pain,Cardiopulmonary status limiting activity,Decreased balance,Decreased coordination,Decreased mobility,Difficulty walking,Impaired tone,Postural dysfunction,Dizziness  Visit Diagnosis: Muscle weakness (generalized)  Other lack of coordination  Difficulty in walking, not elsewhere classified  Repeated falls  Hemiplegia and hemiparesis following cerebral infarction affecting left non-dominant side [facility])     Problem List Patient Active Problem List   Diagnosis Date Noted  . Sepsis ([place]) 10/18/2018    Lewis Moccasin, PT 03/17/2020, 4:00 PM  [place] MAIN [facility] SERVICES [address] [place], [place], [address] Phone: [phone]   Fax:  [phone]  Name: Kenneth Summers MRN: [id number] Date of Birth: 1945-03-16

## 2020-03-18 ENCOUNTER — Other Ambulatory Visit: Payer: Self-pay

## 2020-03-18 ENCOUNTER — Ambulatory Visit: Payer: Medicare Other

## 2020-03-18 ENCOUNTER — Ambulatory Visit: Payer: Medicare Other | Admitting: Occupational Therapy

## 2020-03-18 ENCOUNTER — Encounter: Payer: Self-pay | Admitting: Occupational Therapy

## 2020-03-18 DIAGNOSIS — M6281 Muscle weakness (generalized): Secondary | ICD-10-CM

## 2020-03-18 DIAGNOSIS — R262 Difficulty in walking, not elsewhere classified: Secondary | ICD-10-CM

## 2020-03-18 DIAGNOSIS — R278 Other lack of coordination: Secondary | ICD-10-CM

## 2020-03-18 DIAGNOSIS — R296 Repeated falls: Secondary | ICD-10-CM

## 2020-03-18 NOTE — Therapy (Signed)
[facility] MAIN [facility] SERVICES [address] [place], [place], [address] Phone: [phone]   Fax:  [phone]  Physical Therapy Treatment  Patient Details  Name: Kenneth Summers MRN: [id number] Date of Birth: 1946/01/03 No data recorded  Encounter Date: 03/18/2020   PT End of Session - 03/18/20 0955    Visit Number [id number]    Number of Visits 252    Date for PT Re-Evaluation 04/19/20    Authorization Type Medicare reporting period starting 09/11/18; NO FOTO    Authorization Time Period goals last assessed 03/01/2020    PT Start Time 1346    PT Stop Time 1429    PT Time Calculation (min) 43 min    Equipment Utilized During Treatment Gait belt    Activity Tolerance No increased pain;Patient limited by fatigue;Patient tolerated treatment well    Behavior During Therapy [facility] for tasks assessed/performed           Past Medical History:  Diagnosis Date  . Actinic keratosis   . Alcohol abuse   . APS (antiphospholipid syndrome) (HCC)   . Basal cell carcinoma   . BPH (benign prostatic hyperplasia)   . CAD (coronary artery disease)   . Cellulitis   . Cervical spinal stenosis   . Chronic osteomyelitis (HCC)   . CKD (chronic kidney disease)   . CKD (chronic kidney disease)   . Clostridium difficile diarrhea   . Depression   . Diplopia   . Fatigue   . GERD (gastroesophageal reflux disease)   . Hyperlipemia   . Hypertension   . Hypovitaminosis D   . IBS (irritable bowel syndrome)   . IgA deficiency (HCC)   . Insomnia   . Left lumbosacral radiculopathy   . Moderate obstructive sleep apnea   . Osteomyelitis of foot (HCC)   . PAD (peripheral artery disease) (HCC)   . Pruritus   . RA (rheumatoid arthritis) (HCC)   . Radiculopathy   . Restless legs syndrome   . RLS (restless legs syndrome)   . SLE (systemic lupus erythematosus related syndrome) (HCC)   . 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 Stroke  (HCC)   . Toxic maculopathy of both eyes     Past Surgical History:  Procedure Laterality Date  . CARDIAC CATHETERIZATION    . CAROTID ENDARTERECTOMY    . CERVICAL LAMINECTOMY    . CORONARY ANGIOPLASTY    . FRACTURE SURGERY    . fusion C5-6-7    . HERNIA REPAIR    . KNEE ARTHROSCOPY    . TONSILLECTOMY      There were no vitals filed for this visit.   Subjective Assessment - 03/18/20 0952    Subjective Patient reports no complaints today. States he has been more compliant working his leg strength with caregiver at home.    Patient is accompained by: Family member    Pertinent History Patient is a 75 y.o. male who presents to outpatient physical therapy with a referral for medical diagnosis of CVA. This patient's chief complaints consist of left hemiplegia and overall deconditioning leading to the following functional deficits: dependent for ADLs, IADLs, unable to transfer to car, dependent transfers at home with hoyer lift, unable to walk or stand without significant assistance, difficulty with W/C navigation..    Limitations Sitting;Lifting;Standing;House hold activities;Writing;Walking;Other (comment)    Diagnostic tests Brain MRI 11/01/2017: IMPRESSION: 1. Acute right ACA territory infarct as demonstrated on prior CT  imaging. No intracranial hemorrhage or significant mass effect. 2. 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    Patient Stated Goals wants to be able to walk again    Currently in Pain? No/denies    Pain Onset Today    Pain Onset Today             Treatment:  Patient performed seated LE knee ex/flex with foot positioned on slide board - instructed to go back and forth each LE approx 2 min each leg.  Patient initially difficulty focusing but did improve with practice. Decreased concentration with minimal ability to kick left LE  Seated hip march with green theraband position in front of patient and he was instructed to lift his  knee to touch the band- unable to lift Left LE more than 6 times without brief rest.  Patient performed 10 reps altogether.  Forward leaning to neutral (upright posture) x 10 reps with verbal and visual cues to perform correctly.  Neuro re-education:  Static standing in //bars: Max assist to stand today - using mirror for feedback.   4 trials today-  1) 20 sec- Patient reported unable to extend hips over feet and exhibited difficulty leaning forward with hips.  2)  60 sec - Much improved hip ext- tactile cues to maintain erect posture- could Feel patient activating low back and glutes with max cues.   3)  55 sec-  Mod A  And best effort standing erect -able to maintain approx 25 sec.  He continues to exhibit difficulty weight shifting forward for improved center of gravity but on improved with practice today.   Pt educated throughout session about proper posture and technique with exercises. Improved exercise technique, movement at target joints, use of target muscles after min to mod verbal, visual, tactile cues.                           PT Education - 03/19/20 0954    Education Details Continued postural and specific exercise technique    Person(s) Educated Patient    Methods Explanation;Demonstration;Tactile cues;Verbal cues    Comprehension Verbalized understanding;Returned demonstration;Verbal cues required;Need further instruction;Tactile cues required            PT Short Term Goals - 01/26/20 1308      PT SHORT TERM GOAL #1   Title Be independent with initial home exercise program for self-management of symptoms.    Baseline HEP to be given at second session; advice to practice unsupported sitting at home (06/19/2018); 07/08/18: Performing at home, 6/29: needs assistance but is doing exercise program regularly; 08/22/18: adherent;    Time 2    Period Weeks    Status Achieved    Target Date 06/26/18             PT Long Term Goals - 03/01/20 1624       PT LONG TERM GOAL #1   Title Patient will complete all bed mobility with min A to improve functional independence for getting in and out of bed and adjusting in bed.     Baseline max A - total A reported by family (06/19/2018); 07/08/18: still requires maxA, dtr reports improvement in rolling since starting therapy; 6/23: min A to supervision in clinic; not doing at home right; 8/5: Pt's daughter states that his rolling has improved, but still has difficulty with all other bed mobility; 10/23/18: pts daughter states that she is doing  about 35% of the work if he is in proper position, he does uses the bed rails to help roll;  using Michiel Sites for coming to sit EOB; 10/12: pts daughter states that she is doing about 30% of the work if he is in proper position, he does uses the bed rails to help roll, but it is improving;  using Michiel Sites for coming to sit EOB, 10/29: min A for sit to sidelying, rolling supervision, able to transition sidelying to sit with HHA; 12/21: min-mod A. 03/01/2020- Patient presents improving bed mobility with current ability to roll to left with CGA and verbal and tactile cues and min assist to roll to right as seen by recent visits.    Time 12    Period Weeks    Status On-going    Target Date 04/26/20      PT LONG TERM GOAL #2   Title Patient will complete sit <> stand transfer chair to chair with Min A and LRAD to improve functional independence for household and community mobility.     Baseline min A for STS, still requires elevated surface height to 27".  03/01/20- Patient currently unable to perform a chair to chair transfer and continues to be transferred using hoyer lift. Patient has been training with sit to stand at parallel bars and able to currently perform sit to stand with max assist  from 27in. high surface.    Time 12    Period Weeks    Status On-going    Target Date 04/26/20      PT LONG TERM GOAL #3   Title Patient will navigate power w/c with min A x 100 feet to  improve mobility for household and short community distances.    Baseline required total A (06/12/2018); able to roll 20 feet with min A (06/20/2018); 07/08/18: Pt can go approximately 10' without assist per family;  10/23/2018: pt's daughter states that his power WC mobility has become 100% better with the new hand control, slowed speed, and with practice; 10/12: pt's daughter reports that he can perform household WC mobility and limited community ambulation with minA    Time 12    Period Weeks    Status Achieved      PT LONG TERM GOAL #4   Title Patient will complete car transfers W/C to family SUV with min A using LRAD to improve his abilty to participate in community activities.     Baseline unable to complete car transfers at all (06/12/2018); unable to complete car transfers at all (06/19/2018).; 07/08/18: Unable to perform at this time; 09/11/18: unable to perform at this time; 10/23/2018: unable to complete car transfers at all; 10/12:  unable to complete car transfers at all, 10/29: unable; 01/13/19: unable; 2/11: min-mod A +2, 3/10: min A+1, 12/21: mod A. 03/01/2020- Patient unable to complete car transfers and continues to require medical transportation at this time.    Time 12    Period Weeks      PT LONG TERM GOAL #5   Title Patient will ambulate at least 10 feet with min A using LRAD to improve mobility for household and community distances.    Baseline unable to take steps (06/12/2018); able to shift R leg forward and back with max A and RW at edge of plinth (06/19/2018); 07/08/18: unable to ambulate at this time. 09/11/18: unable to perform at this time; 10/23/2018: unable to perform at this time; 10/12: unable to perform at this time, 10/29: unable at this time;  01/13/19: unable at this time, 2/11: min A to step with RLE, unable to step with LLE, 3/10: able to take 2 steps in parallel bars with min A for safety; 4/1: min A for walking 10 feet in parallel bars, 5/24: min A walking 10 feet in parallel bars;  6/21: no change 7/14: no change. 03/01/2020- Patient unable to ambulate and currently focusing on static standing. He was able to take a forward step with his right LE last week but unable today.    Time 12    Period Weeks    Status Deferred      Additional Long Term Goals   Additional Long Term Goals Yes      PT LONG TERM GOAL #6   Title Patient will be able to safety navigate up/down ramp with power chair, exhibiting good safety awareness, mod I to safely enter/exit his home.    Baseline 2/11: supervision with intermittent min A;4/1: supervision; 5/24: supervision. 03/01/2020- patient reports that he is able to navigate down the ramp at home to leave the home but continues to need assist to navigate up the ramp into the home.    Time 12    Period Weeks    Status Partially Met      PT LONG TERM GOAL #7   Title Patient will increase functional reach test to >15 inches in sitting to exhibit improved sitting balance and positioning and reduce fall risk;    Baseline 07/30/18: 12 inches; 09/11/18: 10-12 inches; 10/23/18: 12-13 inch; 10/12: 14.5", 10/29: 15.5 inch    Time 4    Period Weeks    Status Achieved      PT LONG TERM GOAL #8   Title Patient will increase LLE quad strength to 3+/5 to improve functional strength for standing and mobility;    Baseline 10/28: 2+/5; 01/13/19: 2+/5, 2/11: 3-/5, 04/16/19: 3-/5, 05/08/19: 3-/5, 5/24: 3-/5, 6/21: 3-/5, 7/14: 3-/5, 12/21: 3-/5. 03/01/2020- Left quad= 3-/5 (focusing on quad strengthening during visits and with home program).    Time 12    Period Weeks    Status On-going      PT LONG TERM GOAL  #9   TITLE Patient will report no vertigo with provoking motions or positions in order to be able to perform ADLs and mobility tasks without symptoms.    Baseline 06/30/19: reports no dizziness in last week; 6/21: one episode this weekend; 7/14: experiencing one a week; 03/01/2020.- Patient has denied any dizziness for over past 3 weeks.    Time 12    Period  Weeks    Status Achieved      PT LONG TERM GOAL  #10   TITLE Patient will demonstrate improved functional LE/Postural strength as seen by ability to perform static standing for 2 min or greater with max assist  to assist with strength required for ADL's and transfers/pregait/gait activities.    Baseline 03/01/2020- Patient able to stand approx 40 sec today with max assist in parallel bars.    Time 8    Period Weeks    Target Date 04/26/20                 Plan - 03/18/20 0955    Clinical Impression Statement Today, Patient was able to exhibit brief periods of much imroved posture today- able to demo improved hip ext and improve his center of gravity with max cues and physical assist to shift his weight forward. He will continue to benefit from skilled PT services to  improve his functional strength and postural control for improved transfers/standing endurance.    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    Examination-Activity Limitations Bed Mobility;Bend;Sit;Toileting;Stand;Stairs;Lift;Transfers;Squat;Locomotion Level;Carry;Dressing;Hygiene/Grooming;Continence    Examination-Participation Restrictions Yard Work;Interpersonal Relationship;Community Activity    Stability/Clinical Decision Making Evolving/Moderate complexity    Rehab Potential Fair    PT Frequency 3x / week    PT Duration 12 weeks    PT Treatment/Interventions ADLs/Self Care Home  Management;Electrical Stimulation;Moist Heat;Cryotherapy;Gait training;Stair training;Functional mobility training;Therapeutic exercise;Balance training;Neuromuscular re-education;Cognitive remediation;Patient/family education;Orthotic Fit/Training;Wheelchair mobility training;Manual techniques;Passive range of motion;Energy conservation;Joint Manipulations;Canalith Repostioning;Therapeutic activities;Vestibular    PT Next Visit Plan Continue with progressive postural/LE strengthening.    PT Home Exercise Plan no updates this session    Consulted and Agree with Plan of Care Patient;Family member/caregiver    Family Member Consulted Caregiver- Crystal           Patient will benefit from skilled therapeutic intervention in order to improve the following deficits and impairments:  Abnormal gait,Decreased activity tolerance,Decreased cognition,Decreased endurance,Decreased knowledge of use of DME,Decreased range of motion,Decreased skin integrity,Decreased strength,Impaired perceived functional ability,Impaired sensation,Impaired UE functional use,Improper body mechanics,Pain,Cardiopulmonary status limiting activity,Decreased balance,Decreased coordination,Decreased mobility,Difficulty walking,Impaired tone,Postural dysfunction,Dizziness  Visit Diagnosis: Muscle weakness (generalized)  Other lack of coordination  Difficulty in walking, not elsewhere classified  Repeated falls     Problem List Patient Active Problem List   Diagnosis Date Noted  . Sepsis (HCC) 10/18/2018    Lenda Kelp, PT 03/19/2020, 11:14 AM  [facility] MAIN [facility] SERVICES [address] [place], [place], [address] Phone: [phone]   Fax:  [phone]  Name: Kenneth Summers MRN: [id number] Date of Birth: 05-May-1945

## 2020-03-18 NOTE — Therapy (Signed)
[place] MAIN [facility] SERVICES [address] [place], [place], [address] Phone: [phone]   Fax:  [phone]  Occupational Therapy Treatment  Patient Details  Name: Kenneth Summers MRN: [id number] Date of Birth: 11-12-1945 No data recorded  Encounter Date: 03/18/2020   OT End of Session - 03/18/20 1307    Visit Number [id number]    Number of Visits 205    Date for OT Re-Evaluation 05/24/20    Authorization Type Progress report period starting 03/11/2020    OT Start Time 1300    OT Stop Time 1345    OT Time Calculation (min) 45 min    Activity Tolerance Patient tolerated treatment well    Behavior During Therapy [facility] for tasks assessed/performed           Past Medical History:  Diagnosis Date  . Actinic keratosis   . Alcohol abuse   . APS (antiphospholipid syndrome) ([place])   . Basal cell carcinoma   . BPH (benign prostatic hyperplasia)   . CAD (coronary artery disease)   . Cellulitis   . Cervical spinal stenosis   . Chronic osteomyelitis ([place])   . CKD (chronic kidney disease)   . CKD (chronic kidney disease)   . Clostridium difficile diarrhea   . Depression   . Diplopia   . Fatigue   . GERD (gastroesophageal reflux disease)   . Hyperlipemia   . Hypertension   . Hypovitaminosis D   . IBS (irritable bowel syndrome)   . 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 Insomnia   . Left lumbosacral radiculopathy   . Moderate obstructive sleep apnea   . Osteomyelitis of foot ([place])   . PAD (peripheral artery disease) ([place])   . Pruritus   . RA (rheumatoid arthritis) ([place])   . Radiculopathy   . Restless legs syndrome   . RLS (restless legs syndrome)   . SLE (systemic lupus erythematosus related syndrome) ([place])   . 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 Stroke ([place])   . Toxic maculopathy of both eyes     Past Surgical History:  Procedure Laterality Date  . CARDIAC CATHETERIZATION    . CAROTID ENDARTERECTOMY    .  CERVICAL LAMINECTOMY    . CORONARY ANGIOPLASTY    . FRACTURE SURGERY    . fusion C5-6-7    . HERNIA REPAIR    . KNEE ARTHROSCOPY    . TONSILLECTOMY      There were no vitals filed for this visit.   Subjective Assessment - 03/18/20 1306    Subjective  Pt. reports that he is doing well    Patient is accompanied by: Family member    Pertinent History Pt. is a 75 y.o. 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 Pt. PMHx includes: Multiple Falls, Lupus, DJD, Renal Abscess, CVA. Pt. resides with his wife. Pt.'s wife and daughter assist with ADLs. Pt. has caregivers in  for 2 hours a day, 6 days a week. Pt. received Rehab services in acute care, at SNF for STR, and [place] services. Pt. is retired from [employer] for [employer] and [employer].    Currently in Pain? No/denies          OT TREATMENT   Therapeutic Exercise:  Pt. performed2#dowelfor UE strengthening secondary to weakness.Pt.tolerated increased dowel weight.Bilateral shoulder flexion, chest press,andcircular patterns were performedfor1-2 sets 20 reps each.Pt. worked on3# dumbbell ex. forbilateralelbow flexion  and extension, 3# for rightand  2# left.Pt. perfromedforearm supination/pronation, wrist flexion/extension 3# for the RUE, and 2# with the left for1 set of10-20and radial deviation 1 set 20 reps.  Pt.continuestomake progress with BUE strength, and tolerance for there. Ex.Pt.continues to present with weakness, as well asimpaired motor control, and motor planning.Pt. tolerated BUE exercises well, with no reportsofdiscomfort. Pt. required consistent verbal, visual, and tactile cues. Pt.continues torequire less cuesto perform UE strengthening exercises, technique, and formas well as visual demonstration prior to the exercises.Pt. continues to work on improving BUE strength, and [facility] skills in order to improve LUE functioning during ADLs, IADLs, and tranfers.                         OT Education - 03/18/20 1307    Education Details strengthening, UE functioning    Person(s) Educated Patient    Methods Explanation;Demonstration    Comprehension Returned demonstration;Tactile cues required;Verbalized understanding;Need further instruction               OT Long Term Goals - 03/11/20 1315      OT LONG TERM GOAL #1   Title Pt. will increase left shoulder flexion AROM by 10 degrees to access cabinet/shelf.    Baseline Pt. conitnues to present with limited left shoulder ROM limitng access to cabinets, and shelves.03/01/2020: Left shoulder 116 degrees with new onset of numbness radiating from the left side of the neck to the hand. Pt. continues to progress with left shoulder ROM .Shouler flexion 130, and continues to work on reaching with increasing emphasis on flexing trunk to improve functional reach.  07/21/19- shoulder flexion to 130 with effort    Time 12    Period Weeks    Status On-going    Target Date 05/24/20      OT LONG TERM GOAL #2   Title Pt. will donn a shirt with Supervision.    Baseline Pt. continues to require modA donning a zip down jacket with consistent cues to initiate the correct direction of the jacket.  Min-modA mahaging the zipper. MinA for donning a T-shirt with assist to roll side to side to pull the shirt down over his back. 07/21/19 continues to require min assist at times for shirt/jacket    Time 12    Period Weeks    Status On-going    Target Date 05/24/20      OT LONG TERM GOAL #4   Title Pt. will improve left grip strength by 10# to be able to open a jar/container.    Baseline Grip strength conitnues to be limited. Decreased left sided grip strength today secondary to a new onset of numbenss, and weakness radiating from the neck to the left hand. Pt. continues to have a harder time opening a wide mouthed jar, however is able to open a smaller jar easier.    Time 12    Period Weeks    Status  On-going    Target Date 05/24/20      OT LONG TERM GOAL #5   Title Pt. will improve left hand [facility] skills to be able to assist with buttoning/zipping.    Baseline Pt. conitnues to work on improving [facility] skills  to improve buttoning , and zipping.03/01/2020: Pt.has had a new onset of weakness, and numbness in the left hand beginning upon waking up this morning which has resulted in a significant change in [facility] skills.  Pt. has improved overall donning a jacket, and  manipulating  the zipper on his jacket, however continues to require practice depending on the type of zipper jacket. Pt. continues to work on improving bilateral [facility] skills for buttoning/zipping.  07/21/19 improving with buttons and zippers.    Time 12    Period Weeks    Status On-going    Target Date 05/24/20      OT LONG TERM GOAL #6   Title Pt. will demonstrate visual conmpensatory strategies for 100% of the time during ADLs    Baseline Improving, however Pt. continues to require intermittent cues cues for left sides awareness.    Time 12    Period Weeks    Status On-going      OT LONG TERM GOAL #8   Title Pt. will accurately identify potential safety hazard using good safety awareness, and judgement 100% for ADLs, and IADLs.    Baseline Pt. continues to require cues    Time 12    Period Weeks    Status On-going    Target Date 05/24/20      OT LONG TERM GOAL  #9   TITLE Pt. will  navigate his w/c around obstacles with Supervision and 100% accuracy    Baseline Pt. continues to require cuing for obstacles on the left, however requires less cues overall. Pt. requires significant cues to engage his left UE during tasks. 07/21/2019 continues to improve with decreased assist at times    Time 12    Period Weeks    Status On-going    Target Date 05/24/20      OT LONG TERM GOAL  #10   TITLE Pt. will independently be able  to reach to place items into cabinetry, and closets.    Baseline Pt. continues to have difficulty with  functional reaching conitnues to be limited.    Time 12    Period Weeks    Status On-going    Target Date 05/24/20      OT LONG TERM GOAL  #11   TITLE Pt. will increase BUE strength by 6mm grades in preparation for ADL transfers.    Baseline Pt. BUE strength conitnues to be limited. Pt. is able to tolerate increased resistive weights. Pt. continues to work towards improving overall strength for ADLs.    Time 12    Period Weeks    Status On-going    Target Date 05/24/20      OT LONG TERM GOAL  #12   TITLE Pt. will improve LUE functional reaching with the left engaging the trunk 100% of the time during ADL tasks with minimal cues.    Baseline Left sided numbness has improved. New onset of left sided numbness, and weakness is affecting his ability to perform reaching.    Time 12    Period Weeks    Status On-going    Target Date 05/24/20                 Plan - 03/18/20 1309    Clinical Impression Statement Pt.continuestomake progress with BUE strength, and tolerance for there. Ex.Pt.continues to present with weakness, as well asimpaired motor control, and motor planning.Pt. tolerated BUE exercises well, with no reportsofdiscomfort. Pt. required consistent verbal, visual, and tactile cues. Pt.continues torequire less cuesto perform UE strengthening exercises, technique, and formas well as visual demonstration prior to the exercises.Pt. continues to work on improving BUE strength, and [facility] skills in order to improve LUE functioning during ADLs, IADLs, and tranfers.    OT Occupational Profile and History Problem Focused  Assessment - Including review of records relating to presenting problem    Occupational performance deficits (Please refer to evaluation for details): ADL's    Body Structure / Function / Physical Skills UE functional use;Coordination;FMC;Dexterity;Strength;ROM    Cognitive Skills Attention;Memory;Emotional;Problem Solve;Safety Awareness    Rehab Potential  Good    Clinical Decision Making Several treatment options, min-mod task modification necessary    Comorbidities Affecting Occupational Performance: Presence of comorbidities impacting occupational performance    Comorbidities impacting occupational performance description: Phyical, cognitive, visual,  medical comorbidities    Modification or Assistance to Complete Evaluation  No modification of tasks or assist necessary to complete eval    OT Frequency 2x / week    OT Duration 12 weeks    OT Treatment/Interventions Self-care/ADL training;DME and/or AE instruction;Therapeutic exercise;Therapeutic activities;Moist Heat;Cognitive remediation/compensation;Neuromuscular education;Visual/perceptual remediation/compensation;Coping strategies training;Patient/family education;Passive range of motion;Psychosocial skills training;Energy conservation;Functional Mobility Training    Consulted and Agree with Plan of Care Patient;Family member/caregiver           Patient will benefit from skilled therapeutic intervention in order to improve the following deficits and impairments:   Body Structure / Function / Physical Skills: UE functional use,Coordination,FMC,Dexterity,Strength,ROM Cognitive Skills: Attention,Memory,Emotional,Problem Solve,Safety Awareness     Visit Diagnosis: Muscle weakness (generalized)  Other lack of coordination    Problem List Patient Active Problem List   Diagnosis Date Noted  . Sepsis ([place]) 10/18/2018    Harrel Carina, MS, OTR/L 03/18/2020, 1:12 PM  [place] MAIN [facility] SERVICES [address] [place], [place], [address] Phone: [phone]   Fax:  [phone]  Name: Kenneth Summers MRN: [id number] Date of Birth: February 28, 1945

## 2020-03-22 ENCOUNTER — Other Ambulatory Visit: Payer: Self-pay

## 2020-03-22 ENCOUNTER — Encounter: Payer: Self-pay | Admitting: Occupational Therapy

## 2020-03-22 ENCOUNTER — Ambulatory Visit: Payer: Medicare Other | Admitting: Occupational Therapy

## 2020-03-22 ENCOUNTER — Ambulatory Visit: Payer: Medicare Other

## 2020-03-22 DIAGNOSIS — I69354 Hemiplegia and hemiparesis following cerebral infarction affecting left non-dominant side: Secondary | ICD-10-CM

## 2020-03-22 DIAGNOSIS — M6281 Muscle weakness (generalized): Secondary | ICD-10-CM

## 2020-03-22 DIAGNOSIS — R278 Other lack of coordination: Secondary | ICD-10-CM

## 2020-03-22 DIAGNOSIS — R262 Difficulty in walking, not elsewhere classified: Secondary | ICD-10-CM

## 2020-03-22 DIAGNOSIS — R296 Repeated falls: Secondary | ICD-10-CM

## 2020-03-22 NOTE — Therapy (Signed)
[place] MAIN [facility] SERVICES [address] [place], [place], [address] Phone: [phone]   Fax:  [phone]  Physical Therapy Treatment  Patient Details  Name: Kenneth Summers MRN: [id number] Date of Birth: 12-14-1945 No data recorded  Encounter Date: 03/22/2020   PT End of Session - 03/22/20 1525    Visit Number [id number]    Number of Visits 252    Date for PT Re-Evaluation 04/19/20    Authorization Type Medicare reporting period starting 09/11/18; NO FOTO    Authorization Time Period goals last assessed 03/01/2020    PT Start Time 1300    PT Stop Time 1344    PT Time Calculation (min) 44 min    Equipment Utilized During Treatment Gait belt    Activity Tolerance No increased pain;Patient limited by fatigue;Patient tolerated treatment well    Behavior During Therapy [facility] for tasks assessed/performed           Past Medical History:  Diagnosis Date  . Actinic keratosis   . Alcohol abuse   . APS (antiphospholipid syndrome) ([place])   . Basal cell carcinoma   . BPH (benign prostatic hyperplasia)   . CAD (coronary artery disease)   . Cellulitis   . Cervical spinal stenosis   . Chronic osteomyelitis ([place])   . CKD (chronic kidney disease)   . CKD (chronic kidney disease)   . Clostridium difficile diarrhea   . Depression   . Diplopia   . Fatigue   . GERD (gastroesophageal reflux disease)   . Hyperlipemia   . Hypertension   . Hypovitaminosis D   . IBS (irritable bowel syndrome)   . 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 Insomnia   . Left lumbosacral radiculopathy   . Moderate obstructive sleep apnea   . Osteomyelitis of foot ([place])   . PAD (peripheral artery disease) ([place])   . Pruritus   . RA (rheumatoid arthritis) ([place])   . Radiculopathy   . Restless legs syndrome   . RLS (restless legs syndrome)   . SLE (systemic lupus  erythematosus related syndrome) ([place])   . 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 Stroke ([place])   . Toxic maculopathy of both eyes     Past Surgical History:  Procedure Laterality Date  . CARDIAC CATHETERIZATION    . CAROTID ENDARTERECTOMY    . CERVICAL LAMINECTOMY    . CORONARY ANGIOPLASTY    . FRACTURE SURGERY    . fusion C5-6-7    . HERNIA REPAIR    . KNEE ARTHROSCOPY    . TONSILLECTOMY      There were no vitals filed for this visit.   Subjective Assessment - 03/22/20 1306    Subjective Patient reports feeling okay and had a good weekend. Reports that he has been leaning forward and moving better.    Patient is accompained by: Family member    Pertinent History Patient is a 75 y.o. male who  presents to outpatient physical therapy with a referral for medical diagnosis of CVA. This patient's chief complaints consist of left hemiplegia and overall deconditioning leading to the following functional deficits: dependent for ADLs, IADLs, unable to transfer to car, dependent transfers at home with hoyer lift, unable to walk or stand without significant assistance, difficulty with W/C navigation..    Limitations Sitting;Lifting;Standing;House hold activities;Writing;Walking;Other (comment)    Diagnostic tests Brain MRI 11/01/2017: IMPRESSION: 1. Acute right ACA territory infarct as demonstrated on prior CT imaging. No intracranial hemorrhage or significant mass effect. 2. 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    Patient Stated Goals wants to be able to walk again    Currently in Pain? No/denies    Pain Onset Today    Pain Onset Today                    Neuro re-education:  Transfer- Sit to stand with verbal/tactile cues for positioning. Patient performed sit to stand x 5 reps with cues to scoot out to edge of seat. Position feet. Then lean forward and keep looking ahead and not down. Patient required brief  rest break between sets and completed a total of 20 reps with improving ability with decreased cues and practice.     Static standing in //bars:   Patient performed 3 trials of static standing after sit to stand today:  1) 28 sec- fatigued and unable to stand erect without max assist today.  2) 44 sec - patient able to contract glutes but unable to stand erect for more than 5 sec at a time with constant reminders to stand upright.   3) 40 sec with less cues for posture and improved ability to stand erect without max assist - mod assist on last stand only.     Pt educated throughout session about proper posture and technique with exercises. Improved exercise technique, movement at target joints, use of target muscles after min to mod verbal, visual, tactile cues.                   PT Education - 03/22/20 1523    Education Details Specific tactile/verbal cues for posture and sit to stand transfers today.    Person(s) Educated Patient    Methods Explanation;Demonstration;Tactile cues;Verbal cues    Comprehension Verbalized understanding;Tactile cues required;Returned demonstration;Need further instruction;Verbal cues required            PT Short Term Goals - 01/26/20 1308      PT SHORT TERM GOAL #1   Title Be independent with initial home exercise program for self-management of symptoms.    Baseline HEP to be given at second session; advice to practice unsupported sitting at home (06/19/2018); 07/08/18: Performing at home, 6/29: needs assistance but is doing exercise program regularly; 08/22/18: adherent;    Time 2    Period Weeks    Status Achieved    Target Date 06/26/18             PT Long Term Goals - 03/01/20 1624      PT LONG TERM GOAL #1   Title Patient will complete all bed mobility with min A to improve functional independence for getting in and out of bed and adjusting in bed.     Baseline max A - total A reported by family (06/19/2018); 07/08/18: still  requires maxA, dtr reports improvement in rolling since starting therapy; 6/23: min A to supervision in clinic; not doing  at home right; 8/5: Pt's daughter states that his rolling has improved, but still has difficulty with all other bed mobility; 10/23/18: pts daughter states that she is doing about 35% of the work if he is in proper position, he does uses the bed rails to help roll;  using Harrel Lemon for coming to sit EOB; 10/12: pts daughter states that she is doing about 30% of the work if he is in proper position, he does uses the bed rails to help roll, but it is improving;  using Harrel Lemon for coming to sit EOB, 10/29: min A for sit to sidelying, rolling supervision, able to transition sidelying to sit with HHA; 12/21: min-mod A. 03/01/2020- Patient presents improving bed mobility with current ability to roll to left with CGA and verbal and tactile cues and min assist to roll to right as seen by recent visits.    Time 12    Period Weeks    Status On-going    Target Date 04/26/20      PT LONG TERM GOAL #2   Title Patient will complete sit <> stand transfer chair to chair with Min A and LRAD to improve functional independence for household and community mobility.     Baseline min A for STS, still requires elevated surface height to 27".  03/01/20- Patient currently unable to perform a chair to chair transfer and continues to be transferred using hoyer lift. Patient has been training with sit to stand at parallel bars and able to currently perform sit to stand with max assist  from 27in. high surface.    Time 12    Period Weeks    Status On-going    Target Date 04/26/20      PT LONG TERM GOAL #3   Title Patient will navigate power w/c with min A x 100 feet to improve mobility for household and short community distances.    Baseline required total A (06/12/2018); able to roll 20 feet with min A (06/20/2018); 07/08/18: Pt can go approximately 10' without assist per family;  10/23/2018: pt's daughter states that  his power WC mobility has become 100% better with the new hand control, slowed speed, and with practice; 10/12: pt's daughter reports that he can perform household WC mobility and limited community ambulation with minA    Time 12    Period Weeks    Status Achieved      PT LONG TERM GOAL #4   Title Patient will complete car transfers W/C to family SUV with min A using LRAD to improve his abilty to participate in community activities.     Baseline unable to complete car transfers at all (06/12/2018); unable to complete car transfers at all (06/19/2018).; 07/08/18: Unable to perform at this time; 09/11/18: unable to perform at this time; 10/23/2018: unable to complete car transfers at all; 10/12:  unable to complete car transfers at all, 10/29: unable; 01/13/19: unable; 2/11: min-mod A +2, 3/10: min A+1, 12/21: mod A. 03/01/2020- Patient unable to complete car transfers and continues to require medical transportation at this time.    Time 12    Period Weeks      PT LONG TERM GOAL #5   Title Patient will ambulate at least 10 feet with min A using LRAD to improve mobility for household and community distances.    Baseline unable to take steps (06/12/2018); able to shift R leg forward and back with max A and RW at edge of plinth (06/19/2018); 07/08/18: unable to ambulate  at this time. 09/11/18: unable to perform at this time; 10/23/2018: unable to perform at this time; 10/12: unable to perform at this time, 10/29: unable at this time; 01/13/19: unable at this time, 2/11: min A to step with RLE, unable to step with LLE, 3/10: able to take 2 steps in parallel bars with min A for safety; 4/1: min A for walking 10 feet in parallel bars, 5/24: min A walking 10 feet in parallel bars; 6/21: no change 7/14: no change. 03/01/2020- Patient unable to ambulate and currently focusing on static standing. He was able to take a forward step with his right LE last week but unable today.    Time 12    Period Weeks    Status Deferred       Additional Long Term Goals   Additional Long Term Goals Yes      PT LONG TERM GOAL #6   Title Patient will be able to safety navigate up/down ramp with power chair, exhibiting good safety awareness, mod I to safely enter/exit his home.    Baseline 2/11: supervision with intermittent min A;4/1: supervision; 5/24: supervision. 03/01/2020- patient reports that he is able to navigate down the ramp at home to leave the home but continues to need assist to navigate up the ramp into the home.    Time 12    Period Weeks    Status Partially Met      PT LONG TERM GOAL #7   Title Patient will increase functional reach test to >15 inches in sitting to exhibit improved sitting balance and positioning and reduce fall risk;    Baseline 07/30/18: 12 inches; 09/11/18: 10-12 inches; 10/23/18: 12-13 inch; 10/12: 14.5", 10/29: 15.5 inch    Time 4    Period Weeks    Status Achieved      PT LONG TERM GOAL #8   Title Patient will increase LLE quad strength to 3+/5 to improve functional strength for standing and mobility;    Baseline 10/28: 2+/5; 01/13/19: 2+/5, 2/11: 3-/5, 04/16/19: 3-/5, 05/08/19: 3-/5, 5/24: 3-/5, 6/21: 3-/5, 7/14: 3-/5, 12/21: 3-/5. 03/01/2020- Left quad= 3-/5 (focusing on quad strengthening during visits and with home program).    Time 12    Period Weeks    Status On-going      PT LONG TERM GOAL  #9   TITLE Patient will report no vertigo with provoking motions or positions in order to be able to perform ADLs and mobility tasks without symptoms.    Baseline 06/30/19: reports no dizziness in last week; 6/21: one episode this weekend; 7/14: experiencing one a week; 03/01/2020.- Patient has denied any dizziness for over past 3 weeks.    Time 12    Period Weeks    Status Achieved      PT LONG TERM GOAL  #10   TITLE Patient will demonstrate improved functional LE/Postural strength as seen by ability to perform static standing for 2 min or greater with max assist  to assist with strength required for  ADL's and transfers/pregait/gait activities.    Baseline 03/01/2020- Patient able to stand approx 40 sec today with max assist in parallel bars.    Time 8    Period Weeks    Target Date 04/26/20                 Plan - 03/22/20 1533    Clinical Impression Statement Patient presented with improved ability to perform sit to stand with decreased overall support/cues. He performed 20  reps in total and improved with practice -initiating the lean forward almost 100% of time with no cues and only about 50% cues to look ahead rather than down.He will continue to benefit from skilled PT services to improve his functional strength and postural control for improved transfers/standing endurance.    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    Examination-Activity Limitations Bed Mobility;Bend;Sit;Toileting;Stand;Stairs;Lift;Transfers;Squat;Locomotion Level;Carry;Dressing;Hygiene/Grooming;Continence    Examination-Participation Restrictions Yard Work;Interpersonal Relationship;Community Activity    Stability/Clinical Decision Making Evolving/Moderate complexity    Rehab Potential Fair    PT Frequency 3x / week    PT Duration 12 weeks    PT Treatment/Interventions ADLs/Self Care Home Management;Electrical Stimulation;Moist Heat;Cryotherapy;Gait training;Stair training;Functional mobility training;Therapeutic exercise;Balance training;Neuromuscular re-education;Cognitive  remediation;Patient/family education;Orthotic Fit/Training;Wheelchair mobility training;Manual techniques;Passive range of motion;Energy conservation;Joint Manipulations;Canalith Repostioning;Therapeutic activities;Vestibular    PT Next Visit Plan Continue with progressive postural/LE strengthening.    Consulted and Agree with Plan of Care Patient;Family member/caregiver    Family Member Consulted Caregiver- [name]           Patient will benefit from skilled therapeutic intervention in order to improve the following deficits and impairments:  Abnormal gait,Decreased activity tolerance,Decreased cognition,Decreased endurance,Decreased knowledge of use of DME,Decreased range of motion,Decreased skin integrity,Decreased strength,Impaired perceived functional ability,Impaired sensation,Impaired UE functional use,Improper body mechanics,Pain,Cardiopulmonary status limiting activity,Decreased balance,Decreased coordination,Decreased mobility,Difficulty walking,Impaired tone,Postural dysfunction,Dizziness  Visit Diagnosis: Muscle weakness (generalized)  Hemiplegia and hemiparesis following cerebral infarction affecting left non-dominant side ([place])  Other lack of coordination  Difficulty in walking, not elsewhere classified  Repeated falls     Problem List Patient Active Problem List   Diagnosis Date Noted  . Sepsis ([place]) 10/18/2018    Lewis Moccasin, PT 03/22/2020, 3:44 PM  [place] MAIN [facility] SERVICES [address] [place], [place], [address] Phone: [phone]   Fax:  [phone]  Name: Kenneth Summers MRN: [id number] Date of Birth: September 18, 1945

## 2020-03-22 NOTE — Therapy (Signed)
[place] MAIN [facility] SERVICES [address] [place], [place], [address] Phone: [phone]   Fax:  [phone]  Occupational Therapy Treatment  Patient Details  Name: Kenneth Summers MRN: [id number] Date of Birth: [birth year] No data recorded  Encounter Date: 03/22/2020   OT End of Session - 03/22/20 1353    Visit Number [id number]    Number of Visits 205    Date for OT Re-Evaluation 05/24/20    Authorization Type Progress report period starting 03/11/2020    OT Start Time 1350    OT Stop Time 1430    OT Time Calculation (min) 40 min    Activity Tolerance Patient tolerated treatment well           Past Medical History:  Diagnosis Date  . Actinic keratosis   . Alcohol abuse   . APS (antiphospholipid syndrome) ([place])   . Basal cell carcinoma   . BPH (benign prostatic hyperplasia)   . CAD (coronary artery disease)   . Cellulitis   . Cervical spinal stenosis   . Chronic osteomyelitis ([place])   . CKD (chronic kidney disease)   . CKD (chronic kidney disease)   . Clostridium difficile diarrhea   . Depression   . Diplopia   . Fatigue   . GERD (gastroesophageal reflux disease)   . Hyperlipemia   . Hypertension   . Hypovitaminosis D   . IBS (irritable bowel syndrome)   . 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 Insomnia   . Left lumbosacral radiculopathy   . Moderate obstructive sleep apnea   . Osteomyelitis of foot ([place])   . PAD (peripheral artery disease) ([place])   . Pruritus   . RA (rheumatoid arthritis) ([place])   . Radiculopathy   . Restless legs syndrome   . RLS (restless legs syndrome)   . SLE (systemic lupus erythematosus related syndrome) ([place])   . 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 Stroke ([place])   . Toxic maculopathy of both eyes     Past Surgical History:  Procedure Laterality Date  . CARDIAC CATHETERIZATION    . CAROTID ENDARTERECTOMY    . CERVICAL LAMINECTOMY    . CORONARY ANGIOPLASTY    .  FRACTURE SURGERY    . fusion C5-6-7    . HERNIA REPAIR    . KNEE ARTHROSCOPY    . TONSILLECTOMY      There were no vitals filed for this visit.   Subjective Assessment - 03/22/20 1352    Subjective  Pt. reports that he is doing well    Patient is accompanied by: Family member    Pertinent History Pt. is a 75 y.o. 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 Pt. PMHx includes: Multiple Falls, Lupus, DJD, Renal Abscess, CVA. Pt. resides with his wife. Pt.'s wife and daughter assist with ADLs. Pt. has caregivers in  for 2 hours a day, 6 days a week. Pt. received Rehab services in acute care, at SNF for STR, and [place] services. Pt. is retired from [employer] for [employer] and [employer].    Currently in Pain? No/denies          OT TREATMENT   Therapeutic Exercise:  Pt. performed2.5#dowelfor UE strengthening secondary to weakness.Pt.tolerated increased dowel weight.Bilateral shoulder flexion, chest press,andcircular patterns were performedfor1-2 sets 20 reps each.Pt. worked on3# dumbbell ex. forbilateralelbow flexion and extension, 3# for rightand2#left.Pt. perfromedforearm supination/pronation, wrist flexion/extension 3#  for the RUE, and 2# with the left for1 set of10-20and radial deviation 1 set 20 reps.  Pt.continuestomake steady progress with BUE strength, and tolerance for there. Ex.Pt.continues to present with weakness, as well asimpaired motor control, and motor planning.Pt. tolerated BUE exercises well, with no reportsofdiscomfort. Pt. continues to require consistent verbal, visual, and tactile cues. Pt.continues torequire less cuesto perform UE strengthening exercises, technique, and formas well as visual demonstration prior to the exercises.Pt. continues to work on improving BUE strength, and [facility] skills in order to improve LUE functioning during ADLs, IADLs, and tranfers.                       OT Education - 03/22/20 1352    Education Details strengthening, UE functioning    Person(s) Educated Patient    Methods Explanation;Demonstration    Comprehension Returned demonstration;Tactile cues required;Verbalized understanding;Need further instruction               OT Long Term Goals - 03/11/20 1315      OT LONG TERM GOAL #1   Title Pt. will increase left shoulder flexion AROM by 10 degrees to access cabinet/shelf.    Baseline Pt. conitnues to present with limited left shoulder ROM limitng access to cabinets, and shelves.03/01/2020: Left shoulder 116 degrees with new onset of numbness radiating from the left side of the neck to the hand. Pt. continues to progress with left shoulder ROM .Shouler flexion 130, and continues to work on reaching with increasing emphasis on flexing trunk to improve functional reach.  07/21/19- shoulder flexion to 130 with effort    Time 12    Period Weeks    Status On-going    Target Date 05/24/20      OT LONG TERM GOAL #2   Title Pt. will donn a shirt with Supervision.    Baseline Pt. continues to require modA donning a zip down jacket with consistent cues to initiate the correct direction of the jacket.  Min-modA mahaging the zipper. MinA for donning a T-shirt with assist to roll side to side to pull the shirt down over his back. 07/21/19 continues to require min assist at times for shirt/jacket    Time 12    Period Weeks    Status On-going    Target Date 05/24/20      OT LONG TERM GOAL #4   Title Pt. will improve left grip strength by 10# to be able to open a jar/container.    Baseline Grip strength conitnues to be limited. Decreased left sided grip strength today secondary to a new onset of numbenss, and weakness radiating from the neck to the left hand. Pt. continues to have a harder time opening a wide mouthed jar, however is able to open a smaller jar easier.    Time 12    Period Weeks    Status  On-going    Target Date 05/24/20      OT LONG TERM GOAL #5   Title Pt. will improve left hand [facility] skills to be able to assist with buttoning/zipping.    Baseline Pt. conitnues to work on improving [facility] skills  to improve buttoning , and zipping.03/01/2020: Pt.has had a new onset of weakness, and numbness in the left hand beginning upon waking up this morning which has resulted in a significant change in [facility] skills.  Pt. has improved overall donning a jacket, and  manipulating the zipper on his jacket, however continues to require practice depending on  the type of zipper jacket. Pt. continues to work on improving bilateral [facility] skills for buttoning/zipping.  07/21/19 improving with buttons and zippers.    Time 12    Period Weeks    Status On-going    Target Date 05/24/20      OT LONG TERM GOAL #6   Title Pt. will demonstrate visual conmpensatory strategies for 100% of the time during ADLs    Baseline Improving, however Pt. continues to require intermittent cues cues for left sides awareness.    Time 12    Period Weeks    Status On-going      OT LONG TERM GOAL #8   Title Pt. will accurately identify potential safety hazard using good safety awareness, and judgement 100% for ADLs, and IADLs.    Baseline Pt. continues to require cues    Time 12    Period Weeks    Status On-going    Target Date 05/24/20      OT LONG TERM GOAL  #9   TITLE Pt. will  navigate his w/c around obstacles with Supervision and 100% accuracy    Baseline Pt. continues to require cuing for obstacles on the left, however requires less cues overall. Pt. requires significant cues to engage his left UE during tasks. 07/21/2019 continues to improve with decreased assist at times    Time 12    Period Weeks    Status On-going    Target Date 05/24/20      OT LONG TERM GOAL  #10   TITLE Pt. will independently be able  to reach to place items into cabinetry, and closets.    Baseline Pt. continues to have difficulty with  functional reaching conitnues to be limited.    Time 12    Period Weeks    Status On-going    Target Date 05/24/20      OT LONG TERM GOAL  #11   TITLE Pt. will increase BUE strength by 29mm grades in preparation for ADL transfers.    Baseline Pt. BUE strength conitnues to be limited. Pt. is able to tolerate increased resistive weights. Pt. continues to work towards improving overall strength for ADLs.    Time 12    Period Weeks    Status On-going    Target Date 05/24/20      OT LONG TERM GOAL  #12   TITLE Pt. will improve LUE functional reaching with the left engaging the trunk 100% of the time during ADL tasks with minimal cues.    Baseline Left sided numbness has improved. New onset of left sided numbness, and weakness is affecting his ability to perform reaching.    Time 12    Period Weeks    Status On-going    Target Date 05/24/20                 Plan - 03/22/20 1355    Clinical Impression Statement Pt.continuestomake steady progress with BUE strength, and tolerance for there. Ex.Pt.continues to present with weakness, as well asimpaired motor control, and motor planning.Pt. tolerated BUE exercises well, with no reportsofdiscomfort. Pt. Continues to require consistent verbal, visual, and tactile cues. Pt.continues torequire less cuesto perform UE strengthening exercises, technique, and formas well as visual demonstration prior to the exercises.Pt. continues to work on improving BUE strength, and [facility] skills in order to improve LUE functioning during ADLs, IADLs, and tranfers.   OT Occupational Profile and History Problem Focused Assessment - Including review of records relating to presenting problem  Occupational performance deficits (Please refer to evaluation for details): ADL's    Body Structure / Function / Physical Skills UE functional use;Coordination;FMC;Dexterity;Strength;ROM    Cognitive Skills Attention;Memory;Emotional;Problem Solve;Safety Awareness     Rehab Potential Good    Clinical Decision Making Several treatment options, min-mod task modification necessary    Comorbidities Affecting Occupational Performance: Presence of comorbidities impacting occupational performance    Comorbidities impacting occupational performance description: Phyical, cognitive, visual,  medical comorbidities    Modification or Assistance to Complete Evaluation  No modification of tasks or assist necessary to complete eval    OT Frequency 2x / week    OT Duration 12 weeks    OT Treatment/Interventions Self-care/ADL training;DME and/or AE instruction;Therapeutic exercise;Therapeutic activities;Moist Heat;Cognitive remediation/compensation;Neuromuscular education;Visual/perceptual remediation/compensation;Coping strategies training;Patient/family education;Passive range of motion;Psychosocial skills training;Energy conservation;Functional Mobility Training    Consulted and Agree with Plan of Care Patient;Family member/caregiver           Patient will benefit from skilled therapeutic intervention in order to improve the following deficits and impairments:   Body Structure / Function / Physical Skills: UE functional use,Coordination,FMC,Dexterity,Strength,ROM Cognitive Skills: Attention,Memory,Emotional,Problem Solve,Safety Awareness     Visit Diagnosis: Muscle weakness (generalized)  Other lack of coordination    Problem List Patient Active Problem List   Diagnosis Date Noted  . Sepsis ([place]) 10/18/2018    Harrel Carina, MS, OTR/L 03/22/2020, 1:58 PM  [place] MAIN [facility] SERVICES [address] [place], [place], [address] Phone: [phone]   Fax:  [phone]  Name: Kenneth Summers MRN: [id number] Date of Birth: 1945-09-07

## 2020-03-24 ENCOUNTER — Ambulatory Visit: Payer: Medicare Other

## 2020-03-25 ENCOUNTER — Ambulatory Visit: Payer: Medicare Other | Admitting: Occupational Therapy

## 2020-03-25 ENCOUNTER — Other Ambulatory Visit: Payer: Self-pay

## 2020-03-25 ENCOUNTER — Ambulatory Visit: Payer: Medicare Other

## 2020-03-25 ENCOUNTER — Encounter: Payer: Self-pay | Admitting: Occupational Therapy

## 2020-03-25 DIAGNOSIS — M6281 Muscle weakness (generalized): Secondary | ICD-10-CM

## 2020-03-25 DIAGNOSIS — R262 Difficulty in walking, not elsewhere classified: Secondary | ICD-10-CM

## 2020-03-25 DIAGNOSIS — R278 Other lack of coordination: Secondary | ICD-10-CM

## 2020-03-25 DIAGNOSIS — I69354 Hemiplegia and hemiparesis following cerebral infarction affecting left non-dominant side: Secondary | ICD-10-CM

## 2020-03-25 DIAGNOSIS — R296 Repeated falls: Secondary | ICD-10-CM

## 2020-03-25 NOTE — Therapy (Signed)
[place] MAIN [facility] SERVICES [address] [place], [place], [address] Phone: [phone]   Fax:  [phone]  Occupational Therapy Treatment  Patient Details  Name: Kenneth Summers MRN: [id number] Date of Birth: 1945/09/01 No data recorded  Encounter Date: 03/25/2020   OT End of Session - 03/25/20 1307    Visit Number [id number]    Number of Visits 205    Date for OT Re-Evaluation 05/24/20    Authorization Type Progress report period starting 03/11/2020    OT Start Time 1300    OT Stop Time 1345    OT Time Calculation (min) 45 min    Activity Tolerance Patient tolerated treatment well    Behavior During Therapy [facility] for tasks assessed/performed           Past Medical History:  Diagnosis Date  . Actinic keratosis   . Alcohol abuse   . APS (antiphospholipid syndrome) ([place])   . Basal cell carcinoma   . BPH (benign prostatic hyperplasia)   . CAD (coronary artery disease)   . Cellulitis   . Cervical spinal stenosis   . Chronic osteomyelitis ([place])   . CKD (chronic kidney disease)   . CKD (chronic kidney disease)   . Clostridium difficile diarrhea   . Depression   . Diplopia   . Fatigue   . GERD (gastroesophageal reflux disease)   . Hyperlipemia   . Hypertension   . Hypovitaminosis D   . IBS (irritable bowel syndrome)   . 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 Insomnia   . Left lumbosacral radiculopathy   . Moderate obstructive sleep apnea   . Osteomyelitis of foot ([place])   . PAD (peripheral artery disease) ([place])   . Pruritus   . RA (rheumatoid arthritis) ([place])   . Radiculopathy   . Restless legs syndrome   . RLS (restless legs syndrome)   . SLE (systemic lupus erythematosus related syndrome) ([place])   . 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 Stroke ([place])   . Toxic maculopathy of both eyes     Past Surgical History:  Procedure Laterality Date  . CARDIAC CATHETERIZATION    . CAROTID ENDARTERECTOMY    .  CERVICAL LAMINECTOMY    . CORONARY ANGIOPLASTY    . FRACTURE SURGERY    . fusion C5-6-7    . HERNIA REPAIR    . KNEE ARTHROSCOPY    . TONSILLECTOMY      There were no vitals filed for this visit.   Subjective Assessment - 03/25/20 1307    Subjective  Pt. reports that he is doing well    Patient is accompanied by: Family member    Pertinent History Pt. is a 75 y.o. 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 Pt. PMHx includes: Multiple Falls, Lupus, DJD, Renal Abscess, CVA. Pt. resides with his wife. Pt.'s wife and daughter assist with ADLs. Pt. has caregivers in  for 2 hours a day, 6 days a week. Pt. received Rehab services in acute care, at SNF for STR, and [place] services. Pt. is retired from [employer] for [employer] and [employer].    Currently in Pain? Yes    Pain Score 2     Pain Location Shoulder    Pain Orientation Left    Pain Descriptors / Indicators Aching          OT TREATMENT   Therapeutic Exercise:  Pt. performed2#dowelfor UE strengthening secondary to weakness.Pt.tolerated increased dowel weight.Bilateral shoulder flexion, chest press,andcircular patterns were performedfor1-2 sets 10 reps each.Pt. worked on3# dumbbell ex. forbilateralelbow flexion and extension, 3# for rightand2#left.Pt. performedforearm supination/pronation, wrist flexion/extension 3# for the RUE, and 2# with the left for1 set of10-20and radial deviation 1 set 20 reps. AROM for thumb opposition.  Pt.continuestomake steady progress with BUE strength, and tolerance for there. Ex.Pt.continues to present with weakness, as well asimpaired motor control, and motor planning. Pt. Tolerated less reps of the left shoulder exercises. Pt. continues to require consistent verbal, visual, and tactile cues. Pt. required cues for radial deviation. Pt.continues torequire less cuesto perform UE strengthening exercises, technique, and formas well  as visual demonstration prior to the exercises.Pt. continues to work on improving BUE strength, and [facility] skills in order to improve LUE functioning during ADLs, IADLs, and tranfers.                            OT Education - 03/25/20 1307    Education Details strengthening, UE functioning    Person(s) Educated Patient    Methods Explanation;Demonstration    Comprehension Returned demonstration;Tactile cues required;Verbalized understanding;Need further instruction               OT Long Term Goals - 03/11/20 1315      OT LONG TERM GOAL #1   Title Pt. will increase left shoulder flexion AROM by 10 degrees to access cabinet/shelf.    Baseline Pt. conitnues to present with limited left shoulder ROM limitng access to cabinets, and shelves.03/01/2020: Left shoulder 116 degrees with new onset of numbness radiating from the left side of the neck to the hand. Pt. continues to progress with left shoulder ROM .Shouler flexion 130, and continues to work on reaching with increasing emphasis on flexing trunk to improve functional reach.  07/21/19- shoulder flexion to 130 with effort    Time 12    Period Weeks    Status On-going    Target Date 05/24/20      OT LONG TERM GOAL #2   Title Pt. will donn a shirt with Supervision.    Baseline Pt. continues to require modA donning a zip down jacket with consistent cues to initiate the correct direction of the jacket.  Min-modA mahaging the zipper. MinA for donning a T-shirt with assist to roll side to side to pull the shirt down over his back. 07/21/19 continues to require min assist at times for shirt/jacket    Time 12    Period Weeks    Status On-going    Target Date 05/24/20      OT LONG TERM GOAL #4   Title Pt. will improve left grip strength by 10# to be able to open a jar/container.    Baseline Grip strength conitnues to be limited. Decreased left sided grip strength today secondary to a new onset of numbenss, and weakness  radiating from the neck to the left hand. Pt. continues to have a harder time opening a wide mouthed jar, however is able to open a smaller jar easier.    Time 12    Period Weeks    Status On-going    Target Date 05/24/20      OT LONG TERM GOAL #5   Title Pt. will improve left hand [facility] skills to be able to assist with buttoning/zipping.    Baseline Pt. conitnues to work on improving [facility] skills  to improve buttoning ,  and zipping.03/01/2020: Pt.has had a new onset of weakness, and numbness in the left hand beginning upon waking up this morning which has resulted in a significant change in [facility] skills.  Pt. has improved overall donning a jacket, and  manipulating the zipper on his jacket, however continues to require practice depending on the type of zipper jacket. Pt. continues to work on improving bilateral [facility] skills for buttoning/zipping.  07/21/19 improving with buttons and zippers.    Time 12    Period Weeks    Status On-going    Target Date 05/24/20      OT LONG TERM GOAL #6   Title Pt. will demonstrate visual conmpensatory strategies for 100% of the time during ADLs    Baseline Improving, however Pt. continues to require intermittent cues cues for left sides awareness.    Time 12    Period Weeks    Status On-going      OT LONG TERM GOAL #8   Title Pt. will accurately identify potential safety hazard using good safety awareness, and judgement 100% for ADLs, and IADLs.    Baseline Pt. continues to require cues    Time 12    Period Weeks    Status On-going    Target Date 05/24/20      OT LONG TERM GOAL  #9   TITLE Pt. will  navigate his w/c around obstacles with Supervision and 100% accuracy    Baseline Pt. continues to require cuing for obstacles on the left, however requires less cues overall. Pt. requires significant cues to engage his left UE during tasks. 07/21/2019 continues to improve with decreased assist at times    Time 12    Period Weeks    Status On-going    Target Date  05/24/20      OT LONG TERM GOAL  #10   TITLE Pt. will independently be able  to reach to place items into cabinetry, and closets.    Baseline Pt. continues to have difficulty with functional reaching conitnues to be limited.    Time 12    Period Weeks    Status On-going    Target Date 05/24/20      OT LONG TERM GOAL  #11   TITLE Pt. will increase BUE strength by 25mm grades in preparation for ADL transfers.    Baseline Pt. BUE strength conitnues to be limited. Pt. is able to tolerate increased resistive weights. Pt. continues to work towards improving overall strength for ADLs.    Time 12    Period Weeks    Status On-going    Target Date 05/24/20      OT LONG TERM GOAL  #12   TITLE Pt. will improve LUE functional reaching with the left engaging the trunk 100% of the time during ADL tasks with minimal cues.    Baseline Left sided numbness has improved. New onset of left sided numbness, and weakness is affecting his ability to perform reaching.    Time 12    Period Weeks    Status On-going    Target Date 05/24/20                 Plan - 03/25/20 1309    Clinical Impression Statement Pt.continuestomake steady progress with BUE strength, and tolerance for there. Ex.Pt.continues to present with weakness, as well asimpaired motor control, and motor planning. Pt. Tolerated less reps of the left shoulder exercises. Pt. continues to require consistent verbal, visual, and tactile cues. Pt. required cues  for radial deviation. Pt.continues torequire less cuesto perform UE strengthening exercises, technique, and formas well as visual demonstration prior to the exercises.Pt. continues to work on improving BUE strength, and [facility] skills in order to improve LUE functioning during ADLs, IADLs, and tranfers.    OT Occupational Profile and History Problem Focused Assessment - Including review of records relating to presenting problem    Occupational performance deficits (Please refer to  evaluation for details): ADL's    Body Structure / Function / Physical Skills UE functional use;Coordination;FMC;Dexterity;Strength;ROM    Cognitive Skills Attention;Memory;Emotional;Problem Solve;Safety Awareness    Rehab Potential Good    Clinical Decision Making Several treatment options, min-mod task modification necessary    Comorbidities Affecting Occupational Performance: Presence of comorbidities impacting occupational performance    Comorbidities impacting occupational performance description: Phyical, cognitive, visual,  medical comorbidities    Modification or Assistance to Complete Evaluation  No modification of tasks or assist necessary to complete eval    OT Frequency 2x / week    OT Duration 12 weeks    OT Treatment/Interventions Self-care/ADL training;DME and/or AE instruction;Therapeutic exercise;Therapeutic activities;Moist Heat;Cognitive remediation/compensation;Neuromuscular education;Visual/perceptual remediation/compensation;Coping strategies training;Patient/family education;Passive range of motion;Psychosocial skills training;Energy conservation;Functional Mobility Training    Consulted and Agree with Plan of Care Patient;Family member/caregiver    Family Member Consulted daughter [name]           Patient will benefit from skilled therapeutic intervention in order to improve the following deficits and impairments:   Body Structure / Function / Physical Skills: UE functional use,Coordination,FMC,Dexterity,Strength,ROM Cognitive Skills: Attention,Memory,Emotional,Problem Solve,Safety Awareness     Visit Diagnosis: Muscle weakness (generalized)  Other lack of coordination    Problem List Patient Active Problem List   Diagnosis Date Noted  . Sepsis ([place]) 10/18/2018    Harrel Carina, MS, OTR/L 03/25/2020, 1:25 PM  [place] MAIN [facility] SERVICES [address] [place], [place], [address] Phone: [phone]   Fax:   [phone]  Name: Kenneth Summers MRN: [id number] Date of Birth: 07-01-1945

## 2020-03-26 NOTE — Therapy (Signed)
[place] MAIN [facility] SERVICES [address] [place], [place], [address] Phone: [phone]   Fax:  [phone]  Physical Therapy Treatment  Patient Details  Name: Kenneth Summers MRN: [id number] Date of Birth: [birth year] No data recorded  Encounter Date: 03/25/2020   PT End of Session - 03/25/20 1445    Visit Number [id number]    Number of Visits 252    Date for PT Re-Evaluation 04/19/20    Authorization Type Medicare reporting period starting 09/11/18; NO FOTO    Authorization Time Period goals last assessed 03/01/2020    PT Start Time 1348    PT Stop Time 1429    PT Time Calculation (min) 41 min    Equipment Utilized During Treatment Gait belt    Activity Tolerance No increased pain;Patient limited by fatigue;Patient tolerated treatment well    Behavior During Therapy [facility] for tasks assessed/performed           Past Medical History:  Diagnosis Date  . Actinic keratosis   . Alcohol abuse   . APS (antiphospholipid syndrome) ([place])   . Basal cell carcinoma   . BPH (benign prostatic hyperplasia)   . CAD (coronary artery disease)   . Cellulitis   . Cervical spinal stenosis   . Chronic osteomyelitis ([place])   . CKD (chronic kidney disease)   . CKD (chronic kidney disease)   . Clostridium difficile diarrhea   . Depression   . Diplopia   . Fatigue   . GERD (gastroesophageal reflux disease)   . Hyperlipemia   . Hypertension   . Hypovitaminosis D   . IBS (irritable bowel syndrome)   . 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 Insomnia   . Left lumbosacral radiculopathy   . Moderate obstructive sleep apnea   . Osteomyelitis of foot ([place])   . PAD (peripheral artery disease) ([place])   . Pruritus   . RA (rheumatoid arthritis) ([place])   . Radiculopathy   . Restless legs syndrome   . RLS (restless legs syndrome)   . SLE (systemic lupus erythematosus related syndrome) ([place])   . 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 Stroke  ([place])   . Toxic maculopathy of both eyes     Past Surgical History:  Procedure Laterality Date  . CARDIAC CATHETERIZATION    . CAROTID ENDARTERECTOMY    . CERVICAL LAMINECTOMY    . CORONARY ANGIOPLASTY    . FRACTURE SURGERY    . fusion C5-6-7    . HERNIA REPAIR    . KNEE ARTHROSCOPY    . TONSILLECTOMY      There were no vitals filed for this visit.   Subjective Assessment - 03/25/20 1444    Subjective Patient reports some fatigue today but no new complaints.    Patient is accompained by: Family member    Pertinent History Patient is a 75 y.o. male who presents to outpatient physical therapy with a referral for medical diagnosis of CVA. This patient's chief complaints consist of left hemiplegia and overall deconditioning leading to the following functional deficits: dependent for ADLs, IADLs, unable to transfer to car, dependent transfers at home with hoyer lift, unable to walk or stand without significant assistance, difficulty with W/C navigation..    Limitations Sitting;Lifting;Standing;House hold activities;Writing;Walking;Other (comment)    Diagnostic tests Brain MRI 11/01/2017: IMPRESSION: 1. Acute right ACA territory infarct as demonstrated on prior CT imaging. No intracranial hemorrhage or significant mass effect. 2. Age  advanced global brain atrophy and small chronic right occipital Infarct.    Patient Stated Goals wants to be able to walk again    Currently in Pain? No/denies    Pain Onset Today    Pain Onset Today               Patient attempted static standing in // bars today: Unable to stand without max assist today and unable to maintain position or extend hips longer than 10 sec x 2 trials today.  Terminated due to patient too fatigued to stand well today with more dependent help.   Focused on LE strengthening and seated upright posture- cues for patient to not sit back on back support and he performed 3 min of static sit followed by  Performing LE  strengthenig  Hip march (AROM on right/AAROM on L) Hip ABD (AROM on right/AAROM on L) Knee flex/ext (AROM on Right/AAROM on L) Calf raises (AROM on right/ AAROM on L) Toe raises (AROM on right/AAROM on L)  Each exercise 2 sets of 10 reps with focus on upright posture. Patient fatigues very quickly with posture and Left LE requiring more assistance overall today.   Pt educated throughout session about proper posture and technique with exercises. Improved exercise technique, movement at target joints, use of target muscles after min to mod verbal, visual, tactile cues.                      PT Education - 03/26/20 1205    Education Details Postural cues in standing and seated today.    Person(s) Educated Patient    Methods Explanation;Demonstration;Tactile cues;Verbal cues    Comprehension Verbalized understanding;Returned demonstration;Verbal cues required;Tactile cues required;Need further instruction            PT Short Term Goals - 01/26/20 1308      PT SHORT TERM GOAL #1   Title Be independent with initial home exercise program for self-management of symptoms.    Baseline HEP to be given at second session; advice to practice unsupported sitting at home (06/19/2018); 07/08/18: Performing at home, 6/29: needs assistance but is doing exercise program regularly; 08/22/18: adherent;    Time 2    Period Weeks    Status Achieved    Target Date 06/26/18             PT Long Term Goals - 03/01/20 1624      PT LONG TERM GOAL #1   Title Patient will complete all bed mobility with min A to improve functional independence for getting in and out of bed and adjusting in bed.     Baseline max A - total A reported by family (06/19/2018); 07/08/18: still requires maxA, dtr reports improvement in rolling since starting therapy; 6/23: min A to supervision in clinic; not doing at home right; 8/5: Pt's daughter states that his rolling has improved, but still has difficulty with all  other bed mobility; 10/23/18: pts daughter states that she is doing about 35% of the work if he is in proper position, he does uses the bed rails to help roll;  using Hoyer for coming to sit EOB; 10/12: pts daughter states that she is doing about 30% of the work if he is in proper position, he does uses the bed rails to help roll, but it is improving;  using Hoyer for coming to sit EOB, 10/29: min A for sit to sidelying, rolling supervision, able to transition sidelying to sit with HHA;  12/21: min-mod A. 03/01/2020- Patient presents improving bed mobility with current ability to roll to left with CGA and verbal and tactile cues and min assist to roll to right as seen by recent visits.    Time 12    Period Weeks    Status On-going    Target Date 04/26/20      PT LONG TERM GOAL #2   Title Patient will complete sit <> stand transfer chair to chair with Min A and LRAD to improve functional independence for household and community mobility.     Baseline min A for STS, still requires elevated surface height to 27".  03/01/20- Patient currently unable to perform a chair to chair transfer and continues to be transferred using hoyer lift. Patient has been training with sit to stand at parallel bars and able to currently perform sit to stand with max assist  from 27in. high surface.    Time 12    Period Weeks    Status On-going    Target Date 04/26/20      PT LONG TERM GOAL #3   Title Patient will navigate power w/c with min A x 100 feet to improve mobility for household and short community distances.    Baseline required total A (06/12/2018); able to roll 20 feet with min A (06/20/2018); 07/08/18: Pt can go approximately 10' without assist per family;  10/23/2018: pt's daughter states that his power WC mobility has become 100% better with the new hand control, slowed speed, and with practice; 10/12: pt's daughter reports that he can perform household WC mobility and limited community ambulation with minA    Time 12     Period Weeks    Status Achieved      PT LONG TERM GOAL #4   Title Patient will complete car transfers W/C to family SUV with min A using LRAD to improve his abilty to participate in community activities.     Baseline unable to complete car transfers at all (06/12/2018); unable to complete car transfers at all (06/19/2018).; 07/08/18: Unable to perform at this time; 09/11/18: unable to perform at this time; 10/23/2018: unable to complete car transfers at all; 10/12:  unable to complete car transfers at all, 10/29: unable; 01/13/19: unable; 2/11: min-mod A +2, 3/10: min A+1, 12/21: mod A. 03/01/2020- Patient unable to complete car transfers and continues to require medical transportation at this time.    Time 12    Period Weeks      PT LONG TERM GOAL #5   Title Patient will ambulate at least 10 feet with min A using LRAD to improve mobility for household and community distances.    Baseline unable to take steps (06/12/2018); able to shift R leg forward and back with max A and RW at edge of plinth (06/19/2018); 07/08/18: unable to ambulate at this time. 09/11/18: unable to perform at this time; 10/23/2018: unable to perform at this time; 10/12: unable to perform at this time, 10/29: unable at this time; 01/13/19: unable at this time, 2/11: min A to step with RLE, unable to step with LLE, 3/10: able to take 2 steps in parallel bars with min A for safety; 4/1: min A for walking 10 feet in parallel bars, 5/24: min A walking 10 feet in parallel bars; 6/21: no change 7/14: no change. 03/01/2020- Patient unable to ambulate and currently focusing on static standing. He was able to take a forward step with his right LE last week but unable today.  Time 12    Period Weeks    Status Deferred      Additional Long Term Goals   Additional Long Term Goals Yes      PT LONG TERM GOAL #6   Title Patient will be able to safety navigate up/down ramp with power chair, exhibiting good safety awareness, mod I to safely enter/exit  his home.    Baseline 2/11: supervision with intermittent min A;4/1: supervision; 5/24: supervision. 03/01/2020- patient reports that he is able to navigate down the ramp at home to leave the home but continues to need assist to navigate up the ramp into the home.    Time 12    Period Weeks    Status Partially Met      PT LONG TERM GOAL #7   Title Patient will increase functional reach test to >15 inches in sitting to exhibit improved sitting balance and positioning and reduce fall risk;    Baseline 07/30/18: 12 inches; 09/11/18: 10-12 inches; 10/23/18: 12-13 inch; 10/12: 14.5", 10/29: 15.5 inch    Time 4    Period Weeks    Status Achieved      PT LONG TERM GOAL #8   Title Patient will increase LLE quad strength to 3+/5 to improve functional strength for standing and mobility;    Baseline 10/28: 2+/5; 01/13/19: 2+/5, 2/11: 3-/5, 04/16/19: 3-/5, 05/08/19: 3-/5, 5/24: 3-/5, 6/21: 3-/5, 7/14: 3-/5, 12/21: 3-/5. 03/01/2020- Left quad= 3-/5 (focusing on quad strengthening during visits and with home program).    Time 12    Period Weeks    Status On-going      PT LONG TERM GOAL  #9   TITLE Patient will report no vertigo with provoking motions or positions in order to be able to perform ADLs and mobility tasks without symptoms.    Baseline 06/30/19: reports no dizziness in last week; 6/21: one episode this weekend; 7/14: experiencing one a week; 03/01/2020.- Patient has denied any dizziness for over past 3 weeks.    Time 12    Period Weeks    Status Achieved      PT LONG TERM GOAL  #10   TITLE Patient will demonstrate improved functional LE/Postural strength as seen by ability to perform static standing for 2 min or greater with max assist  to assist with strength required for ADL's and transfers/pregait/gait activities.    Baseline 03/01/2020- Patient able to stand approx 40 sec today with max assist in parallel bars.    Time 8    Period Weeks    Target Date 04/26/20                 Plan  - 03/25/20 1206    Clinical Impression Statement Patient presented with increased overal fatigue with standing and unable to move his left LE well as observed with previous visits. He was unable to stand well without maximal assist so deferrred static standing after 2nd trial. Focused on trunk control in sitting and some LE strengthening exercises with fair results on left and good ability with right LE. He will continue to benefit from skilled PT services to improve his functional strength and postural control for improved transfers/standing endurance.    Personal Factors and Comorbidities Age;Comorbidity 3+    Comorbidities Relevant past medical history and comorbidities include long term steroid use for lupus, CKD, chronic osteomyelitis, cervical spine stenosis, BPH, APS, alcohol abuse, peripheral artery disease, depression, GERD, obstructive sleep apnea, HTN, IBS, IgA deficiency, lumbar radiculopathy, RA, Stroke, toxic  maculopathy in both eyes, systemic lupus erythematosus related syndrome, cardiac catheterization, carotid endarterectomy, cervical laminectomy, coronary angioplasty, Fusion C5-C7, knee arthroplasty, lumbar surgery.    Examination-Activity Limitations Bed Mobility;Bend;Sit;Toileting;Stand;Stairs;Lift;Transfers;Squat;Locomotion Level;Carry;Dressing;Hygiene/Grooming;Continence    Examination-Participation Restrictions Yard Work;Interpersonal Relationship;Community Activity    Stability/Clinical Decision Making Evolving/Moderate complexity    Rehab Potential Fair    PT Frequency 3x / week    PT Duration 12 weeks    PT Treatment/Interventions ADLs/Self Care Home Management;Electrical Stimulation;Moist Heat;Cryotherapy;Gait training;Stair training;Functional mobility training;Therapeutic exercise;Balance training;Neuromuscular re-education;Cognitive remediation;Patient/family education;Orthotic Fit/Training;Wheelchair mobility training;Manual techniques;Passive range of motion;Energy  conservation;Joint Manipulations;Canalith Repostioning;Therapeutic activities;Vestibular    PT Next Visit Plan Continue with progressive postural/LE strengthening.    PT Home Exercise Plan no changes    Consulted and Agree with Plan of Care Patient;Family member/caregiver    Family Member Consulted Caregiver- [name]           Patient will benefit from skilled therapeutic intervention in order to improve the following deficits and impairments:  Abnormal gait,Decreased activity tolerance,Decreased cognition,Decreased endurance,Decreased knowledge of use of DME,Decreased range of motion,Decreased skin integrity,Decreased strength,Impaired perceived functional ability,Impaired sensation,Impaired UE functional use,Improper body mechanics,Pain,Cardiopulmonary status limiting activity,Decreased balance,Decreased coordination,Decreased mobility,Difficulty walking,Impaired tone,Postural dysfunction,Dizziness  Visit Diagnosis: Muscle weakness (generalized)  Other lack of coordination  Hemiplegia and hemiparesis following cerebral infarction affecting left non-dominant side ([place])  Difficulty in walking, not elsewhere classified  Repeated falls     Problem List Patient Active Problem List   Diagnosis Date Noted  . Sepsis ([place]) 10/18/2018    Lewis Moccasin, PT 03/26/2020, 12:24 PM  [place] MAIN [facility] SERVICES [address] [place], [place], [address] Phone: [phone]   Fax:  [phone]  Name: Kenneth Summers MRN: [id number] Date of Birth: October 14, 1945

## 2020-03-29 ENCOUNTER — Ambulatory Visit: Payer: Medicare Other | Admitting: Occupational Therapy

## 2020-03-29 ENCOUNTER — Ambulatory Visit: Payer: Medicare Other

## 2020-03-29 ENCOUNTER — Encounter: Payer: Self-pay | Admitting: Occupational Therapy

## 2020-03-29 ENCOUNTER — Other Ambulatory Visit: Payer: Self-pay

## 2020-03-29 DIAGNOSIS — M6281 Muscle weakness (generalized): Secondary | ICD-10-CM

## 2020-03-29 DIAGNOSIS — R278 Other lack of coordination: Secondary | ICD-10-CM

## 2020-03-29 DIAGNOSIS — I69354 Hemiplegia and hemiparesis following cerebral infarction affecting left non-dominant side: Secondary | ICD-10-CM

## 2020-03-29 DIAGNOSIS — R262 Difficulty in walking, not elsewhere classified: Secondary | ICD-10-CM

## 2020-03-29 DIAGNOSIS — R296 Repeated falls: Secondary | ICD-10-CM

## 2020-03-29 NOTE — Therapy (Signed)
[place] MAIN [facility] SERVICES [address] [place], [place], [address] Phone: [phone]   Fax:  [phone]  Occupational Therapy Treatment  Patient Details  Name: Kenneth Summers MRN: [id number] Date of Birth: April 29, 1945 No data recorded  Encounter Date: 03/29/2020   OT End of Session - 03/29/20 1356    Visit Number [id number]    Number of Visits 205    Date for OT Re-Evaluation 05/24/20    Authorization Type Progress report period starting 03/11/2020    OT Start Time 1348    OT Stop Time 1430    OT Time Calculation (min) 42 min    Activity Tolerance Patient tolerated treatment well    Behavior During Therapy [facility] for tasks assessed/performed           Past Medical History:  Diagnosis Date  . Actinic keratosis   . Alcohol abuse   . APS (antiphospholipid syndrome) ([place])   . Basal cell carcinoma   . BPH (benign prostatic hyperplasia)   . CAD (coronary artery disease)   . Cellulitis   . Cervical spinal stenosis   . Chronic osteomyelitis ([place])   . CKD (chronic kidney disease)   . CKD (chronic kidney disease)   . Clostridium difficile diarrhea   . Depression   . Diplopia   . Fatigue   . GERD (gastroesophageal reflux disease)   . Hyperlipemia   . Hypertension   . Hypovitaminosis D   . IBS (irritable bowel syndrome)   . 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 Insomnia   . Left lumbosacral radiculopathy   . Moderate obstructive sleep apnea   . Osteomyelitis of foot ([place])   . PAD (peripheral artery disease) ([place])   . Pruritus   . RA (rheumatoid arthritis) ([place])   . Radiculopathy   . Restless legs syndrome   . RLS (restless legs syndrome)   . SLE (systemic lupus erythematosus related syndrome) ([place])   . 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 Stroke ([place])   . Toxic maculopathy of both eyes     Past Surgical History:  Procedure Laterality Date  . CARDIAC CATHETERIZATION    . CAROTID ENDARTERECTOMY    .  CERVICAL LAMINECTOMY    . CORONARY ANGIOPLASTY    . FRACTURE SURGERY    . fusion C5-6-7    . HERNIA REPAIR    . KNEE ARTHROSCOPY    . TONSILLECTOMY      There were no vitals filed for this visit.   Subjective Assessment - 03/29/20 1356    Subjective  Pt. reports that he is doing well    Patient is accompanied by: Family member    Pertinent History Pt. is a 75 y.o. 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 Pt. PMHx includes: Multiple Falls, Lupus, DJD, Renal Abscess, CVA. Pt. resides with his wife. Pt.'s wife and daughter assist with ADLs. Pt. has caregivers in  for 2 hours a day, 6 days a week. Pt. received Rehab services in acute care, at SNF for STR, and [place] services. Pt. is retired from [employer] for [employer] and [employer].    Currently in Pain? No/denies          OT TREATMENT   Therapeutic Exercise:  Pt. performed2#dowelfor UE strengthening secondary to weakness.Pt.tolerated increased dowel weight.Bilateral shoulder flexion, chest press,andcircular patterns were performedfor1-2 sets 10 reps each.Pt. worked on3# dumbbell ex. forbilateralelbow flexion  and extension, 3# for rightand2#left.Pt. performedforearm supination/pronation, wrist flexion/extension 3# for the RUE, and 2# with the left for1 set of10-20and radial deviation 1 set 20 reps. AROM for thumb opposition. Pt. Worked on pinch strengthening in the left hand for lateral, and 3pt. pinch using yellow, red resistive clips.  Tactile and verbal cues were required for eliciting the desired movement, and correct hand position.  Pt.continuestomakesteadyprogress with BUE strength, and tolerance for there. ex.Pt.continues to present with weakness, as well asimpaired motor control, and motor planning. Pt. Tolerated less reps of the left shoulder exercises. Pt.continues to require consistent verbal, visual, and tactile cues. Pt. required cues for radial  deviation. Pt.continues torequire less cuesto perform UE strengthening exercises, technique, and formas well as visual demonstration prior to the exercises.Pt. continues to work on improving BUE strength, and [facility] skills in order to improve LUE functioning during ADLs, IADLs, and tranfers.                                OT Education - 03/29/20 1356    Education Details strengthening, UE functioning    Person(s) Educated Patient    Methods Explanation;Demonstration    Comprehension Returned demonstration;Tactile cues required;Verbalized understanding;Need further instruction               OT Long Term Goals - 03/11/20 1315      OT LONG TERM GOAL #1   Title Pt. will increase left shoulder flexion AROM by 10 degrees to access cabinet/shelf.    Baseline Pt. conitnues to present with limited left shoulder ROM limitng access to cabinets, and shelves.03/01/2020: Left shoulder 116 degrees with new onset of numbness radiating from the left side of the neck to the hand. Pt. continues to progress with left shoulder ROM .Shouler flexion 130, and continues to work on reaching with increasing emphasis on flexing trunk to improve functional reach.  07/21/19- shoulder flexion to 130 with effort    Time 12    Period Weeks    Status On-going    Target Date 05/24/20      OT LONG TERM GOAL #2   Title Pt. will donn a shirt with Supervision.    Baseline Pt. continues to require modA donning a zip down jacket with consistent cues to initiate the correct direction of the jacket.  Min-modA mahaging the zipper. MinA for donning a T-shirt with assist to roll side to side to pull the shirt down over his back. 07/21/19 continues to require min assist at times for shirt/jacket    Time 12    Period Weeks    Status On-going    Target Date 05/24/20      OT LONG TERM GOAL #4   Title Pt. will improve left grip strength by 10# to be able to open a jar/container.    Baseline  Grip strength conitnues to be limited. Decreased left sided grip strength today secondary to a new onset of numbenss, and weakness radiating from the neck to the left hand. Pt. continues to have a harder time opening a wide mouthed jar, however is able to open a smaller jar easier.    Time 12    Period Weeks    Status On-going    Target Date 05/24/20      OT LONG TERM GOAL #5   Title Pt. will improve left hand [facility] skills to be able to assist with buttoning/zipping.    Baseline Pt. conitnues to  work on improving [facility] skills  to improve buttoning , and zipping.03/01/2020: Pt.has had a new onset of weakness, and numbness in the left hand beginning upon waking up this morning which has resulted in a significant change in [facility] skills.  Pt. has improved overall donning a jacket, and  manipulating the zipper on his jacket, however continues to require practice depending on the type of zipper jacket. Pt. continues to work on improving bilateral [facility] skills for buttoning/zipping.  07/21/19 improving with buttons and zippers.    Time 12    Period Weeks    Status On-going    Target Date 05/24/20      OT LONG TERM GOAL #6   Title Pt. will demonstrate visual conmpensatory strategies for 100% of the time during ADLs    Baseline Improving, however Pt. continues to require intermittent cues cues for left sides awareness.    Time 12    Period Weeks    Status On-going      OT LONG TERM GOAL #8   Title Pt. will accurately identify potential safety hazard using good safety awareness, and judgement 100% for ADLs, and IADLs.    Baseline Pt. continues to require cues    Time 12    Period Weeks    Status On-going    Target Date 05/24/20      OT LONG TERM GOAL  #9   TITLE Pt. will  navigate his w/c around obstacles with Supervision and 100% accuracy    Baseline Pt. continues to require cuing for obstacles on the left, however requires less cues overall. Pt. requires significant cues to engage his left UE during  tasks. 07/21/2019 continues to improve with decreased assist at times    Time 12    Period Weeks    Status On-going    Target Date 05/24/20      OT LONG TERM GOAL  #10   TITLE Pt. will independently be able  to reach to place items into cabinetry, and closets.    Baseline Pt. continues to have difficulty with functional reaching conitnues to be limited.    Time 12    Period Weeks    Status On-going    Target Date 05/24/20      OT LONG TERM GOAL  #11   TITLE Pt. will increase BUE strength by 33mm grades in preparation for ADL transfers.    Baseline Pt. BUE strength conitnues to be limited. Pt. is able to tolerate increased resistive weights. Pt. continues to work towards improving overall strength for ADLs.    Time 12    Period Weeks    Status On-going    Target Date 05/24/20      OT LONG TERM GOAL  #12   TITLE Pt. will improve LUE functional reaching with the left engaging the trunk 100% of the time during ADL tasks with minimal cues.    Baseline Left sided numbness has improved. New onset of left sided numbness, and weakness is affecting his ability to perform reaching.    Time 12    Period Weeks    Status On-going    Target Date 05/24/20                 Plan - 03/29/20 1357    Clinical Impression Statement Pt.continuestomakesteadyprogress with BUE strength, and tolerance for there. ex.Pt.continues to present with weakness, as well asimpaired motor control, and motor planning. Pt. Tolerated less reps of the left shoulder exercises. Pt.continues to require consistent verbal,  visual, and tactile cues. Pt. required cues for radial deviation. Pt.continues torequire less cuesto perform UE strengthening exercises, technique, and formas well as visual demonstration prior to the exercises.Pt. continues to work on improving BUE strength, and [facility] skills in order to improve LUE functioning during ADLs, IADLs, and tranfers.   OT Occupational Profile and History Problem  Focused Assessment - Including review of records relating to presenting problem    Occupational performance deficits (Please refer to evaluation for details): ADL's    Body Structure / Function / Physical Skills UE functional use;Coordination;FMC;Dexterity;Strength;ROM    Cognitive Skills Attention;Memory;Emotional;Problem Solve;Safety Awareness    Rehab Potential Good    Clinical Decision Making Several treatment options, min-mod task modification necessary    Comorbidities Affecting Occupational Performance: Presence of comorbidities impacting occupational performance    Comorbidities impacting occupational performance description: Phyical, cognitive, visual,  medical comorbidities    Modification or Assistance to Complete Evaluation  No modification of tasks or assist necessary to complete eval    OT Frequency 2x / week    OT Duration 12 weeks    OT Treatment/Interventions Self-care/ADL training;DME and/or AE instruction;Therapeutic exercise;Therapeutic activities;Moist Heat;Cognitive remediation/compensation;Neuromuscular education;Visual/perceptual remediation/compensation;Coping strategies training;Patient/family education;Passive range of motion;Psychosocial skills training;Energy conservation;Functional Mobility Training    Consulted and Agree with Plan of Care Patient;Family member/caregiver           Patient will benefit from skilled therapeutic intervention in order to improve the following deficits and impairments:   Body Structure / Function / Physical Skills: UE functional use,Coordination,FMC,Dexterity,Strength,ROM Cognitive Skills: Attention,Memory,Emotional,Problem Solve,Safety Awareness     Visit Diagnosis: Muscle weakness (generalized)  Other lack of coordination    Problem List Patient Active Problem List   Diagnosis Date Noted  . Sepsis ([place]) 10/18/2018    Kenneth Summers 03/29/2020, 2:01 PM  [place] MAIN [facility]  SERVICES [address] [place], [place], [address] Phone: [phone]   Fax:  [phone]  Name: Kenneth Summers MRN: [id number] Date of Birth: 1946-01-12

## 2020-03-30 NOTE — Therapy (Signed)
[place] MAIN [facility] SERVICES [address] [place], [place], [address] Phone: [phone]   Fax:  [phone]  Physical Therapy Treatment  Patient Details  Name: Kenneth Summers MRN: [id number] Date of Birth: 02-11-1945 No data recorded  Encounter Date: 03/29/2020   PT End of Session - 03/29/20 0636    Visit Number [id number]    Number of Visits 252    Date for PT Re-Evaluation 04/19/20    Authorization Type Medicare reporting period starting 09/11/18; NO FOTO    Authorization Time Period goals last assessed 03/01/2020    PT Start Time 1259    PT Stop Time 1343    PT Time Calculation (min) 44 min    Equipment Utilized During Treatment Gait belt    Activity Tolerance No increased pain;Patient limited by fatigue;Patient tolerated treatment well    Behavior During Therapy [facility] for tasks assessed/performed           Past Medical History:  Diagnosis Date  . Actinic keratosis   . Alcohol abuse   . APS (antiphospholipid syndrome) ([employer])   . Basal cell carcinoma   . BPH (benign prostatic hyperplasia)   . CAD (coronary artery disease)   . Cellulitis   . Cervical spinal stenosis   . Chronic osteomyelitis ([place])   . CKD (chronic kidney disease)   . CKD (chronic kidney disease)   . Clostridium difficile diarrhea   . Depression   . Diplopia   . Fatigue   . GERD (gastroesophageal reflux disease)   . Hyperlipemia   . Hypertension   . Hypovitaminosis D   . IBS (irritable bowel syndrome)   . 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 Insomnia   . Left lumbosacral radiculopathy   . Moderate obstructive sleep apnea   . Osteomyelitis of foot ([place])   . PAD (peripheral artery disease) ([place])   . Pruritus   . RA (rheumatoid arthritis) ([place])   . Radiculopathy   . Restless legs syndrome   . RLS (restless legs syndrome)   . SLE (systemic lupus erythematosus related syndrome) ([place])   . 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 Stroke  ([place])   . Toxic maculopathy of both eyes     Past Surgical History:  Procedure Laterality Date  . CARDIAC CATHETERIZATION    . CAROTID ENDARTERECTOMY    . CERVICAL LAMINECTOMY    . CORONARY ANGIOPLASTY    . FRACTURE SURGERY    . fusion C5-6-7    . HERNIA REPAIR    . KNEE ARTHROSCOPY    . TONSILLECTOMY      There were no vitals filed for this visit.   Subjective Assessment - 03/29/20 1306    Subjective Patient reports having a dizzy episode earlier this am but doing okay right now.    Patient is accompained by: Family member    Pertinent History Patient is a 75 y.o. male who presents to outpatient physical therapy with a referral for medical diagnosis of CVA. This patient's chief complaints consist of left hemiplegia and overall deconditioning leading to the following functional deficits: dependent for ADLs, IADLs, unable to transfer to car, dependent transfers at home with hoyer lift, unable to walk or stand without significant assistance, difficulty with W/C navigation..    Limitations Sitting;Lifting;Standing;House hold activities;Writing;Walking;Other (comment)    Diagnostic tests Brain MRI 11/01/2017: IMPRESSION: 1. Acute right ACA territory infarct as demonstrated on prior CT imaging. No intracranial hemorrhage or  significant mass effect. 2. 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    Patient Stated Goals wants to be able to walk again    Currently in Pain? No/denies    Pain Onset Today    Pain Onset Today           Static standing in //bars: Patient requires Max A to sit for all trials at height of approx 27" seat  Patient performed 4 trials of static standing today:  1) 48 sec- Patient able to stand erect for several seconds (approx 10 sec at a time - would lose posture- require verbal and tactile cues to look ahead and "be tall" as well as cues to "squeeze your butt". Patient was able to respond well and stand much more erect for longer  period. 2) 44 sec - similar to first trial with respect to cues and performance. 3) 20 sec - with less cues for posture and improved ability to stand erect without max cueing yet unable to maintain  4) 40 sec- able to take a small step on right LE yet unable to step with left LE and difficulty placing right LE back into original position requiring more dependent transfer back to chair.   Seated LE strengthening- AROM on right LE and AAROM on left:  Seated hip march Seated knee ext Reclined quad sets  Hip add/ABD 10 reps each leg with patient presenting with improving AROM with right LE with less fatigue however requires more physical assist with left LE to perform each exercises through fuller ROM.     Pt educated throughout session about proper posture and technique with exercises. Improved exercise technique, movement at target joints, use of target muscles after min to mod verbal, visual, tactile cues.                                        PT Education - 03/30/20 3536    Education Details Continued posture cues and specific LE exercise cues for technique.    Person(s) Educated Patient    Methods Explanation;Demonstration;Tactile cues;Verbal cues    Comprehension Verbalized understanding;Verbal cues required;Need further instruction;Returned demonstration;Tactile cues required            PT Short Term Goals - 01/26/20 1308      PT SHORT TERM GOAL #1   Title Be independent with initial home exercise program for self-management of symptoms.    Baseline HEP to be given at second session; advice to practice unsupported sitting at home (06/19/2018); 07/08/18: Performing at home, 6/29: needs assistance but is doing exercise program regularly; 08/22/18: adherent;    Time 2    Period Weeks    Status Achieved    Target Date 06/26/18             PT Long Term Goals - 03/01/20 1624      PT LONG TERM GOAL #1   Title Patient will complete all bed  mobility with min A to improve functional independence for getting in and out of bed and adjusting in bed.     Baseline max A - total A reported by family (06/19/2018); 07/08/18: still requires maxA, dtr reports improvement in rolling since starting therapy; 6/23: min A to supervision in clinic; not doing at home right; 8/5: Pt's daughter states that his rolling has improved, but still has difficulty with all other bed mobility; 10/23/18:  pts daughter states that she is doing about 35% of the work if he is in proper position, he does uses the bed rails to help roll;  using Harrel Lemon for coming to sit EOB; 10/12: pts daughter states that she is doing about 30% of the work if he is in proper position, he does uses the bed rails to help roll, but it is improving;  using Harrel Lemon for coming to sit EOB, 10/29: min A for sit to sidelying, rolling supervision, able to transition sidelying to sit with HHA; 12/21: min-mod A. 03/01/2020- Patient presents improving bed mobility with current ability to roll to left with CGA and verbal and tactile cues and min assist to roll to right as seen by recent visits.    Time 12    Period Weeks    Status On-going    Target Date 04/26/20      PT LONG TERM GOAL #2   Title Patient will complete sit <> stand transfer chair to chair with Min A and LRAD to improve functional independence for household and community mobility.     Baseline min A for STS, still requires elevated surface height to 27".  03/01/20- Patient currently unable to perform a chair to chair transfer and continues to be transferred using hoyer lift. Patient has been training with sit to stand at parallel bars and able to currently perform sit to stand with max assist  from 27in. high surface.    Time 12    Period Weeks    Status On-going    Target Date 04/26/20      PT LONG TERM GOAL #3   Title Patient will navigate power w/c with min A x 100 feet to improve mobility for household and short community distances.     Baseline required total A (06/12/2018); able to roll 20 feet with min A (06/20/2018); 07/08/18: Pt can go approximately 10' without assist per family;  10/23/2018: pt's daughter states that his power WC mobility has become 100% better with the new hand control, slowed speed, and with practice; 10/12: pt's daughter reports that he can perform household WC mobility and limited community ambulation with minA    Time 12    Period Weeks    Status Achieved      PT LONG TERM GOAL #4   Title Patient will complete car transfers W/C to family SUV with min A using LRAD to improve his abilty to participate in community activities.     Baseline unable to complete car transfers at all (06/12/2018); unable to complete car transfers at all (06/19/2018).; 07/08/18: Unable to perform at this time; 09/11/18: unable to perform at this time; 10/23/2018: unable to complete car transfers at all; 10/12:  unable to complete car transfers at all, 10/29: unable; 01/13/19: unable; 2/11: min-mod A +2, 3/10: min A+1, 12/21: mod A. 03/01/2020- Patient unable to complete car transfers and continues to require medical transportation at this time.    Time 12    Period Weeks      PT LONG TERM GOAL #5   Title Patient will ambulate at least 10 feet with min A using LRAD to improve mobility for household and community distances.    Baseline unable to take steps (06/12/2018); able to shift R leg forward and back with max A and RW at edge of plinth (06/19/2018); 07/08/18: unable to ambulate at this time. 09/11/18: unable to perform at this time; 10/23/2018: unable to perform at this time; 10/12: unable to perform at  this time, 10/29: unable at this time; 01/13/19: unable at this time, 2/11: min A to step with RLE, unable to step with LLE, 3/10: able to take 2 steps in parallel bars with min A for safety; 4/1: min A for walking 10 feet in parallel bars, 5/24: min A walking 10 feet in parallel bars; 6/21: no change 7/14: no change. 03/01/2020- Patient unable to  ambulate and currently focusing on static standing. He was able to take a forward step with his right LE last week but unable today.    Time 12    Period Weeks    Status Deferred      Additional Long Term Goals   Additional Long Term Goals Yes      PT LONG TERM GOAL #6   Title Patient will be able to safety navigate up/down ramp with power chair, exhibiting good safety awareness, mod I to safely enter/exit his home.    Baseline 2/11: supervision with intermittent min A;4/1: supervision; 5/24: supervision. 03/01/2020- patient reports that he is able to navigate down the ramp at home to leave the home but continues to need assist to navigate up the ramp into the home.    Time 12    Period Weeks    Status Partially Met      PT LONG TERM GOAL #7   Title Patient will increase functional reach test to >15 inches in sitting to exhibit improved sitting balance and positioning and reduce fall risk;    Baseline 07/30/18: 12 inches; 09/11/18: 10-12 inches; 10/23/18: 12-13 inch; 10/12: 14.5", 10/29: 15.5 inch    Time 4    Period Weeks    Status Achieved      PT LONG TERM GOAL #8   Title Patient will increase LLE quad strength to 3+/5 to improve functional strength for standing and mobility;    Baseline 10/28: 2+/5; 01/13/19: 2+/5, 2/11: 3-/5, 04/16/19: 3-/5, 05/08/19: 3-/5, 5/24: 3-/5, 6/21: 3-/5, 7/14: 3-/5, 12/21: 3-/5. 03/01/2020- Left quad= 3-/5 (focusing on quad strengthening during visits and with home program).    Time 12    Period Weeks    Status On-going      PT LONG TERM GOAL  #9   TITLE Patient will report no vertigo with provoking motions or positions in order to be able to perform ADLs and mobility tasks without symptoms.    Baseline 06/30/19: reports no dizziness in last week; 6/21: one episode this weekend; 7/14: experiencing one a week; 03/01/2020.- Patient has denied any dizziness for over past 3 weeks.    Time 12    Period Weeks    Status Achieved      PT LONG TERM GOAL  #10   TITLE  Patient will demonstrate improved functional LE/Postural strength as seen by ability to perform static standing for 2 min or greater with max assist  to assist with strength required for ADL's and transfers/pregait/gait activities.    Baseline 03/01/2020- Patient able to stand approx 40 sec today with max assist in parallel bars.    Time 8    Period Weeks    Target Date 04/26/20                 Plan - 03/29/20 4391    Clinical Impression Statement Patient remains inconsistent with ability to stand and static standing. Today he performed better overall with less cues and improved ability to extend his trunk and hips. His attention was more focused today and he did not present with  as much fatigue as last week. He will continue to benefit from skilled PT services to improve his functional strength and postural control for improved transfers/standing endurance.    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    Examination-Activity Limitations Bed Mobility;Bend;Sit;Toileting;Stand;Stairs;Lift;Transfers;Squat;Locomotion Level;Carry;Dressing;Hygiene/Grooming;Continence    Examination-Participation Restrictions Yard Work;Interpersonal Relationship;Community Activity    Stability/Clinical Decision Making Evolving/Moderate complexity    Rehab Potential Fair    PT Frequency 3x / week    PT Duration 12 weeks    PT Treatment/Interventions ADLs/Self Care Home  Management;Electrical Stimulation;Moist Heat;Cryotherapy;Gait training;Stair training;Functional mobility training;Therapeutic exercise;Balance training;Neuromuscular re-education;Cognitive remediation;Patient/family education;Orthotic Fit/Training;Wheelchair mobility training;Manual techniques;Passive range of motion;Energy conservation;Joint Manipulations;Canalith Repostioning;Therapeutic activities;Vestibular    PT Next Visit Plan Continue with progressive postural/LE strengthening.    PT Home Exercise Plan no changes    Consulted and Agree with Plan of Care Patient;Family member/caregiver    Family Member Consulted Caregiver- [name]           Patient will benefit from skilled therapeutic intervention in order to improve the following deficits and impairments:  Abnormal gait,Decreased activity tolerance,Decreased cognition,Decreased endurance,Decreased knowledge of use of DME,Decreased range of motion,Decreased skin integrity,Decreased strength,Impaired perceived functional ability,Impaired sensation,Impaired UE functional use,Improper body mechanics,Pain,Cardiopulmonary status limiting activity,Decreased balance,Decreased coordination,Decreased mobility,Difficulty walking,Impaired tone,Postural dysfunction,Dizziness  Visit Diagnosis: Muscle weakness (generalized)  Other lack of coordination  Hemiplegia and hemiparesis following cerebral infarction affecting left non-dominant side ([place])  Difficulty in walking, not elsewhere classified  Repeated falls     Problem List Patient Active Problem List   Diagnosis Date Noted  . Sepsis ([place]) 10/18/2018    Lewis Moccasin, PT 03/30/2020, 6:51 AM  [place] MAIN [facility] SERVICES [address] [place], [place], [address] Phone: [phone]   Fax:  [phone]  Name: Kenneth Summers MRN: [id number] Date of Birth: [birth year]

## 2020-03-31 ENCOUNTER — Other Ambulatory Visit: Payer: Self-pay

## 2020-03-31 ENCOUNTER — Ambulatory Visit: Payer: Medicare Other

## 2020-03-31 DIAGNOSIS — I69354 Hemiplegia and hemiparesis following cerebral infarction affecting left non-dominant side: Secondary | ICD-10-CM

## 2020-03-31 DIAGNOSIS — M6281 Muscle weakness (generalized): Secondary | ICD-10-CM

## 2020-03-31 DIAGNOSIS — R278 Other lack of coordination: Secondary | ICD-10-CM

## 2020-03-31 DIAGNOSIS — R262 Difficulty in walking, not elsewhere classified: Secondary | ICD-10-CM

## 2020-03-31 NOTE — Therapy (Signed)
[place] [facility] SERVICES [address] [place], [place], [address] Phone: [phone]   Fax:  [phone]  Physical Therapy Treatment/Physical Therapy Progress Note   Dates of reporting period   03/01/2020  to   0/2/23/2022   Patient Details  Name: Kenneth Summers MRN: [id number] Date of Birth: 15-Jul-1945 No data recorded  Encounter Date: 03/31/2020   PT End of Session - 03/31/20 1304    Visit Number [id number]    Number of Visits 252    Date for PT Re-Evaluation 04/19/20    Authorization Type Medicare reporting period starting 09/11/18; NO FOTO    Authorization Time Period goals last assessed on 03/01/2020    PT Start Time 1258    PT Stop Time 1340    PT Time Calculation (min) 42 min    Equipment Utilized During Treatment Gait belt    Activity Tolerance No increased pain;Patient limited by fatigue;Patient tolerated treatment well    Behavior During Therapy [facility] for tasks assessed/performed           Past Medical History:  Diagnosis Date  . Actinic keratosis   . Alcohol abuse   . APS (antiphospholipid syndrome) ([place])   . Basal cell carcinoma   . BPH (benign prostatic hyperplasia)   . CAD (coronary artery disease)   . Cellulitis   . Cervical spinal stenosis   . Chronic osteomyelitis ([place])   . CKD (chronic kidney disease)   . CKD (chronic kidney disease)   . Clostridium difficile diarrhea   . Depression   . Diplopia   . Fatigue   . GERD (gastroesophageal reflux disease)   . Hyperlipemia   . Hypertension   . Hypovitaminosis D   . IBS (irritable bowel syndrome)   . 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 Insomnia   . Left lumbosacral radiculopathy   . Moderate obstructive sleep apnea   . Osteomyelitis of foot ([place])   . PAD (peripheral artery disease) ([place])   . Pruritus   . RA (rheumatoid arthritis) ([place])   . Radiculopathy   . Restless legs syndrome   . RLS (restless legs syndrome)   . SLE (systemic lupus erythematosus related syndrome)  ([place])   . 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 Stroke ([place])   . Toxic maculopathy of both eyes     Past Surgical History:  Procedure Laterality Date  . CARDIAC CATHETERIZATION    . CAROTID ENDARTERECTOMY    . CERVICAL LAMINECTOMY    . CORONARY ANGIOPLASTY    . FRACTURE SURGERY    . fusion C5-6-7    . HERNIA REPAIR    . KNEE ARTHROSCOPY    . TONSILLECTOMY      There were no vitals filed for this visit.   Subjective Assessment - 03/31/20 1634    Subjective Patient reports having a good day so far. Denies any further dizziness and denies any pain today. Caregiver-Crystal reports they have been performing his exercises and that he continues to move around in the bed better with less overall assistance.    Patient is accompained by: Family member    Pertinent History Patient is a 75 y.o. male who presents to outpatient physical therapy with a referral for medical diagnosis of CVA. This patient's chief complaints consist of left hemiplegia and overall deconditioning leading to the following functional deficits: dependent for ADLs, IADLs, unable to transfer to car, dependent transfers at home with hoyer lift,  unable to walk or stand without significant assistance, difficulty with W/C navigation..    Limitations Sitting;Lifting;Standing;House hold activities;Writing;Walking;Other (comment)    Diagnostic tests Brain MRI 11/01/2017: IMPRESSION: 1. Acute right ACA territory infarct as demonstrated on prior CT imaging. No intracranial hemorrhage or significant mass effect. 2. 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    Patient Stated Goals wants to be able to walk again    Currently in Pain? No/denies    Pain Onset Today    Pain Onset Today           Reassess current goals- see goal section for details.   Patient performed transfer training and static standing in the //Bars:  - Max assist with sit to stand in //bars  from 27" height surface chair- continued verbal cues to scoot forward and lean forward.   Static standing trials  1) Static stand with max assist and BUE support x 1 min 47 - able to contract glutes and position hips forward for improved center of gravity and exhibited his best personal stand (both from quality standpoint and from a timed value today).  2) 53 sec  3) 40 sec 4) 18 sec- unable to straighten Left LE due to fatigue today.                           PT Education - 03/31/20 1636    Education Details Specific tactile/verbal cues for posture with static stand and with sit to stand transfers today.    Person(s) Educated Patient    Methods Explanation;Demonstration;Tactile cues;Verbal cues    Comprehension Verbalized understanding;Need further instruction;Returned demonstration;Verbal cues required;Tactile cues required            PT Short Term Goals - 01/26/20 1308      PT SHORT TERM GOAL #1   Title Be independent with initial home exercise program for self-management of symptoms.    Baseline HEP to be given at second session; advice to practice unsupported sitting at home (06/19/2018); 07/08/18: Performing at home, 6/29: needs assistance but is doing exercise program regularly; 08/22/18: adherent;    Time 2    Period Weeks    Status Achieved    Target Date 06/26/18             PT Long Term Goals - 03/31/20 1343      PT LONG TERM GOAL #1   Title Patient will complete all bed mobility with min A to improve functional independence for getting in and out of bed and adjusting in bed.     Baseline max A - total A reported by family (06/19/2018); 07/08/18: still requires maxA, dtr reports improvement in rolling since starting therapy; 6/23: min A to supervision in clinic; not doing at home right; 8/5: Pt's daughter states that his rolling has improved, but still has difficulty with all other bed mobility; 10/23/18: pts daughter states that she is doing about 35%  of the work if he is in proper position, he does uses the bed rails to help roll;  using Hoyer for coming to sit EOB; 10/12: pts daughter states that she is doing about 30% of the work if he is in proper position, he does uses the bed rails to help roll, but it is improving;  using Hoyer for coming to sit EOB, 10/29: min A for sit to sidelying, rolling supervision, able to transition sidelying to sit with HHA; 12/21: min-mod A. 03/01/2020-  Patient presents improving bed mobility with current ability to roll to left with CGA and verbal and tactile cues and min assist to roll to right as seen by recent visits. 03/31/2020- Patient and caregiver report that he has maintained ability to roll side to side with only minimal assistance.    Time 12    Period Weeks    Status Achieved    Target Date 04/26/20      PT LONG TERM GOAL #2   Title Patient will complete sit <> stand transfer chair to chair with Min A and LRAD to improve functional independence for household and community mobility.     Baseline min A for STS, still requires elevated surface height to 27".  03/01/20- Patient currently unable to perform a chair to chair transfer and continues to be transferred using hoyer lift. Patient has been training with sit to stand at parallel bars and able to currently perform sit to stand with max assist  from 27in. high surface.03/31/2020- Patient remains at [facility] assist with sit to stand transfer and continued use of hoyer lift transfer for all chair to chair or bed to chair transfers.    Time 12    Period Weeks    Status On-going    Target Date 04/26/20      PT LONG TERM GOAL #3   Title Patient will navigate power w/c with min A x 100 feet to improve mobility for household and short community distances.    Baseline required total A (06/12/2018); able to roll 20 feet with min A (06/20/2018); 07/08/18: Pt can go approximately 10' without assist per family;  10/23/2018: pt's daughter states that his power WC mobility has  become 100% better with the new hand control, slowed speed, and with practice; 10/12: pt's daughter reports that he can perform household WC mobility and limited community ambulation with minA    Time 12    Period Weeks    Status Achieved    Target Date 04/26/20      PT LONG TERM GOAL #4   Title Patient will complete car transfers W/C to family SUV with min A using LRAD to improve his abilty to participate in community activities.     Baseline unable to complete car transfers at all (06/12/2018); unable to complete car transfers at all (06/19/2018).; 07/08/18: Unable to perform at this time; 09/11/18: unable to perform at this time; 10/23/2018: unable to complete car transfers at all; 10/12:  unable to complete car transfers at all, 10/29: unable; 01/13/19: unable; 2/11: min-mod A +2, 3/10: min A+1, 12/21: mod A. 03/01/2020- Patient unable to complete car transfers and continues to require medical transportation at this time.    Time 12    Period Weeks      PT LONG TERM GOAL #5   Title Patient will ambulate at least 10 feet with min A using LRAD to improve mobility for household and community distances.    Baseline unable to take steps (06/12/2018); able to shift R leg forward and back with max A and RW at edge of plinth (06/19/2018); 07/08/18: unable to ambulate at this time. 09/11/18: unable to perform at this time; 10/23/2018: unable to perform at this time; 10/12: unable to perform at this time, 10/29: unable at this time; 01/13/19: unable at this time, 2/11: min A to step with RLE, unable to step with LLE, 3/10: able to take 2 steps in parallel bars with min A for safety; 4/1: min A for walking 10  feet in parallel bars, 5/24: min A walking 10 feet in parallel bars; 6/21: no change 7/14: no change. 03/01/2020- Patient unable to ambulate and currently focusing on static standing. He was able to take a forward step with his right LE last week but unable today. 03/31/2020- Patient continues to be non-ambulatory but has  been able to move right LE forward with standing but unable to take a step with left LE.    Time 12    Period Weeks    Status Deferred    Target Date 04/26/20      PT LONG TERM GOAL #6   Title Patient will be able to safety navigate up/down ramp with power chair, exhibiting good safety awareness, mod I to safely enter/exit his home.    Baseline 2/11: supervision with intermittent min A;4/1: supervision; 5/24: supervision. 03/01/2020- patient reports that he is able to navigate down the ramp at home to leave the home but continues to need assist to navigate up the ramp into the home.    Time 12    Period Weeks    Status Partially Met    Target Date 04/26/20      PT LONG TERM GOAL #7   Title Patient will increase functional reach test to >15 inches in sitting to exhibit improved sitting balance and positioning and reduce fall risk;    Baseline 07/30/18: 12 inches; 09/11/18: 10-12 inches; 10/23/18: 12-13 inch; 10/12: 14.5", 10/29: 15.5 inch    Time 4    Period Weeks    Status Achieved      PT LONG TERM GOAL #8   Title Patient will increase LLE quad strength to 3+/5 to improve functional strength for standing and mobility;    Baseline 10/28: 2+/5; 01/13/19: 2+/5, 2/11: 3-/5, 04/16/19: 3-/5, 05/08/19: 3-/5, 5/24: 3-/5, 6/21: 3-/5, 7/14: 3-/5, 12/21: 3-/5. 03/01/2020- Left quad= 3-/5 (focusing on quad strengthening during visits and with home program). 03/31/2020- left quad = 3-/5 with MMT.    Time 12    Period Weeks    Status On-going      PT LONG TERM GOAL  #9   TITLE Patient will report no vertigo with provoking motions or positions in order to be able to perform ADLs and mobility tasks without symptoms.    Baseline 06/30/19: reports no dizziness in last week; 6/21: one episode this weekend; 7/14: experiencing one a week; 03/01/2020.- Patient has denied any dizziness for over past 3 weeks.    Time 12    Period Weeks    Status Achieved      PT LONG TERM GOAL  #10   TITLE Patient will  demonstrate improved functional LE/Postural strength as seen by ability to perform static standing for 2 min or greater with max assist  to assist with strength required for ADL's and transfers/pregait/gait activities.    Baseline 03/01/2020- Patient able to stand approx 40 sec today with max assist in parallel bars. 03/31/2020- Patient able to demo static stand with max A for 30min 47 sec today.    Time 8    Period Weeks    Target Date 04/26/20                 Plan - 03/31/20 1330    Clinical Impression Statement Today patient experienced his best personal standing functional endurance since returning to stand activities  in January. His progress is overall slow and inconcistent but he has made progress with ability to static stand and perform sit to stand  transfer with max assistance.  [name] condition has the potential to improve in response to Physical therapy. Maximum improvement is yet to be obtained. The anticipated improvement is attainable and reasonable in a generally predictable time. Patient is also using his right LE more with therapeutic exercises and improved with active left knee ext and hip flex as seen by improved ability to perform increased reps with seated therex prior to requring assistance.    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    Examination-Activity Limitations Bed  Mobility;Bend;Sit;Toileting;Stand;Stairs;Lift;Transfers;Squat;Locomotion Level;Carry;Dressing;Hygiene/Grooming;Continence    Examination-Participation Restrictions Yard Work;Interpersonal Relationship;Community Activity    Stability/Clinical Decision Making Evolving/Moderate complexity    Rehab Potential Fair    PT Frequency 3x / week    PT Duration 12 weeks    PT Treatment/Interventions ADLs/Self Care Home Management;Electrical Stimulation;Moist Heat;Cryotherapy;Gait training;Stair training;Functional mobility training;Therapeutic exercise;Balance training;Neuromuscular re-education;Cognitive remediation;Patient/family education;Orthotic Fit/Training;Wheelchair mobility training;Manual techniques;Passive range of motion;Energy conservation;Joint Manipulations;Canalith Repostioning;Therapeutic activities;Vestibular    PT Next Visit Plan Continue with progressive postural/LE strengthening.    PT Home Exercise Plan no changes    Consulted and Agree with Plan of Care Patient;Family member/caregiver    Family Member Consulted Caregiver- Taylor           Patient will benefit from skilled therapeutic intervention in order to improve the following deficits and impairments:  Abnormal gait,Decreased activity tolerance,Decreased cognition,Decreased endurance,Decreased knowledge of use of DME,Decreased range of motion,Decreased skin integrity,Decreased strength,Impaired perceived functional ability,Impaired sensation,Impaired UE functional use,Improper body mechanics,Pain,Cardiopulmonary status limiting activity,Decreased balance,Decreased coordination,Decreased mobility,Difficulty walking,Impaired tone,Postural dysfunction,Dizziness  Visit Diagnosis: Muscle weakness (generalized)  Other lack of coordination  Hemiplegia and hemiparesis following cerebral infarction affecting left non-dominant side ([place])  Difficulty in walking, not elsewhere classified     Problem List Patient Active Problem List    Diagnosis Date Noted  . Sepsis ([place]) 10/18/2018    Lewis Moccasin, PT 03/31/2020, 5:00 PM  [place] [facility] SERVICES [address] [place], [place], [address] Phone: [phone]   Fax:  [phone]  Name: Kenneth Summers MRN: [id number] Date of Birth: 02-15-1945

## 2020-04-01 ENCOUNTER — Ambulatory Visit: Payer: Medicare Other

## 2020-04-01 ENCOUNTER — Ambulatory Visit: Payer: Medicare Other | Admitting: Occupational Therapy

## 2020-04-01 ENCOUNTER — Other Ambulatory Visit: Payer: Self-pay

## 2020-04-01 ENCOUNTER — Encounter: Payer: Self-pay | Admitting: Occupational Therapy

## 2020-04-01 DIAGNOSIS — R278 Other lack of coordination: Secondary | ICD-10-CM

## 2020-04-01 DIAGNOSIS — M6281 Muscle weakness (generalized): Secondary | ICD-10-CM

## 2020-04-01 DIAGNOSIS — I69354 Hemiplegia and hemiparesis following cerebral infarction affecting left non-dominant side: Secondary | ICD-10-CM

## 2020-04-01 DIAGNOSIS — R262 Difficulty in walking, not elsewhere classified: Secondary | ICD-10-CM

## 2020-04-01 NOTE — Therapy (Signed)
[facility] MAIN [facility] SERVICES [address] [place], [place], [address] Phone: [phone]   Fax:  [phone]  Physical Therapy Treatment  Patient Details  Name: Kenneth Summers MRN: [id number] Date of Birth: 01/01/1946 No data recorded  Encounter Date: 04/01/2020   PT End of Session - 04/01/20 1506    Visit Number [id number]    Number of Visits 252    Date for PT Re-Evaluation 04/19/20    Authorization Type Medicare reporting period starting 09/11/18; NO FOTO    Authorization Time Period goals last assessed on 03/01/2020    PT Start Time 1345    PT Stop Time 1422    PT Time Calculation (min) 37 min    Equipment Utilized During Treatment Gait belt    Activity Tolerance No increased pain;Patient limited by fatigue;Patient tolerated treatment well    Behavior During Therapy [facility] for tasks assessed/performed           Past Medical History:  Diagnosis Date  . Actinic keratosis   . Alcohol abuse   . APS (antiphospholipid syndrome) (HCC)   . Basal cell carcinoma   . BPH (benign prostatic hyperplasia)   . CAD (coronary artery disease)   . Cellulitis   . Cervical spinal stenosis   . Chronic osteomyelitis (HCC)   . CKD (chronic kidney disease)   . CKD (chronic kidney disease)   . Clostridium difficile diarrhea   . Depression   . Diplopia   . Fatigue   . GERD (gastroesophageal reflux disease)   . Hyperlipemia   . Hypertension   . Hypovitaminosis D   . IBS (irritable bowel syndrome)   . IgA deficiency (HCC)   . Insomnia   . Left lumbosacral radiculopathy   . Moderate obstructive sleep apnea   . Osteomyelitis of foot (HCC)   . PAD (peripheral artery disease) (HCC)   . Pruritus   . RA (rheumatoid arthritis) (HCC)   . Radiculopathy   . Restless legs syndrome   . RLS (restless legs syndrome)   . SLE (systemic lupus erythematosus related syndrome) (HCC)   . 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 Stroke  (HCC)   . Toxic maculopathy of both eyes     Past Surgical History:  Procedure Laterality Date  . CARDIAC CATHETERIZATION    . CAROTID ENDARTERECTOMY    . CERVICAL LAMINECTOMY    . CORONARY ANGIOPLASTY    . FRACTURE SURGERY    . fusion C5-6-7    . HERNIA REPAIR    . KNEE ARTHROSCOPY    . TONSILLECTOMY      There were no vitals filed for this visit.   Subjective Assessment - 04/01/20 1504    Subjective [name] daughter reports that the patients has been very tired today and not as alert. Patient concurred that he was fatigued today.    Patient is accompained by: Family member    Pertinent History Patient is a 75 y.o. male who presents to outpatient physical therapy with a referral for medical diagnosis of CVA. This patient's chief complaints consist of left hemiplegia and overall deconditioning leading to the following functional deficits: dependent for ADLs, IADLs, unable to transfer to car, dependent transfers at home with hoyer lift, unable to walk or stand without significant assistance, difficulty with W/C navigation..    Limitations Sitting;Lifting;Standing;House hold activities;Writing;Walking;Other (comment)    Diagnostic tests Brain MRI 11/01/2017: IMPRESSION: 1. Acute right ACA territory infarct as  demonstrated on prior CT imaging. No intracranial hemorrhage or significant mass effect. 2. 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    Patient Stated Goals wants to be able to walk again    Currently in Pain? No/denies    Pain Onset Today    Pain Onset Today            Dynamic reaching- Upper trunk rotation- reaching left UE to right side and left UE to right side x 20 reps. VCs and min assist to perform correctly.  Static standing trials in //bars:  Max assist STS - patient able to scoot out well today with min/mod A- able to lean forward better today with improved left UE involvement.   1) 28 sec- Stopped due to poor right quad control   2) 38sec - Improved gluteal contraction and erect posture.  3) 42 sec- Patient able to engage trunk and stand with less cues yet limited by right LE weakness.  4) 38 sec-    Attempted seated basketball shooting with nerf balls/goal however patient exhibited increased overall fatigue and unable to sit up straight after standing today despite max verbal cues and physical assist.                          PT Education - 04/01/20 1505    Education Details Postural cues for seated and standing activities.    Person(s) Educated Patient    Methods Explanation;Demonstration;Tactile cues;Verbal cues    Comprehension Returned demonstration;Verbalized understanding;Verbal cues required;Tactile cues required;Need further instruction            PT Short Term Goals - 01/26/20 1308      PT SHORT TERM GOAL #1   Title Be independent with initial home exercise program for self-management of symptoms.    Baseline HEP to be given at second session; advice to practice unsupported sitting at home (06/19/2018); 07/08/18: Performing at home, 6/29: needs assistance but is doing exercise program regularly; 08/22/18: adherent;    Time 2    Period Weeks    Status Achieved    Target Date 06/26/18             PT Long Term Goals - 03/31/20 1343      PT LONG TERM GOAL #1   Title Patient will complete all bed mobility with min A to improve functional independence for getting in and out of bed and adjusting in bed.     Baseline max A - total A reported by family (06/19/2018); 07/08/18: still requires maxA, dtr reports improvement in rolling since starting therapy; 6/23: min A to supervision in clinic; not doing at home right; 8/5: Pt's daughter states that his rolling has improved, but still has difficulty with all other bed mobility; 10/23/18: pts daughter states that she is doing about 35% of the work if he is in proper position, he does uses the bed rails to help roll;  using Michiel Sites for coming to  sit EOB; 10/12: pts daughter states that she is doing about 30% of the work if he is in proper position, he does uses the bed rails to help roll, but it is improving;  using Hoyer for coming to sit EOB, 10/29: min A for sit to sidelying, rolling supervision, able to transition sidelying to sit with HHA; 12/21: min-mod A. 03/01/2020- Patient presents improving bed mobility with current ability to roll to left with CGA and verbal and tactile cues and  min assist to roll to right as seen by recent visits. 03/31/2020- Patient and caregiver report that he has maintained ability to roll side to side with only minimal assistance.    Time 12    Period Weeks    Status Achieved    Target Date 04/26/20      PT LONG TERM GOAL #2   Title Patient will complete sit <> stand transfer chair to chair with Min A and LRAD to improve functional independence for household and community mobility.     Baseline min A for STS, still requires elevated surface height to 27".  03/01/20- Patient currently unable to perform a chair to chair transfer and continues to be transferred using hoyer lift. Patient has been training with sit to stand at parallel bars and able to currently perform sit to stand with max assist  from 27in. high surface.03/31/2020- Patient remains at [facility] assist with sit to stand transfer and continued use of hoyer lift transfer for all chair to chair or bed to chair transfers.    Time 12    Period Weeks    Status On-going    Target Date 04/26/20      PT LONG TERM GOAL #3   Title Patient will navigate power w/c with min A x 100 feet to improve mobility for household and short community distances.    Baseline required total A (06/12/2018); able to roll 20 feet with min A (06/20/2018); 07/08/18: Pt can go approximately 10' without assist per family;  10/23/2018: pt's daughter states that his power WC mobility has become 100% better with the new hand control, slowed speed, and with practice; 10/12: pt's daughter reports  that he can perform household WC mobility and limited community ambulation with minA    Time 12    Period Weeks    Status Achieved    Target Date 04/26/20      PT LONG TERM GOAL #4   Title Patient will complete car transfers W/C to family SUV with min A using LRAD to improve his abilty to participate in community activities.     Baseline unable to complete car transfers at all (06/12/2018); unable to complete car transfers at all (06/19/2018).; 07/08/18: Unable to perform at this time; 09/11/18: unable to perform at this time; 10/23/2018: unable to complete car transfers at all; 10/12:  unable to complete car transfers at all, 10/29: unable; 01/13/19: unable; 2/11: min-mod A +2, 3/10: min A+1, 12/21: mod A. 03/01/2020- Patient unable to complete car transfers and continues to require medical transportation at this time.    Time 12    Period Weeks      PT LONG TERM GOAL #5   Title Patient will ambulate at least 10 feet with min A using LRAD to improve mobility for household and community distances.    Baseline unable to take steps (06/12/2018); able to shift R leg forward and back with max A and RW at edge of plinth (06/19/2018); 07/08/18: unable to ambulate at this time. 09/11/18: unable to perform at this time; 10/23/2018: unable to perform at this time; 10/12: unable to perform at this time, 10/29: unable at this time; 01/13/19: unable at this time, 2/11: min A to step with RLE, unable to step with LLE, 3/10: able to take 2 steps in parallel bars with min A for safety; 4/1: min A for walking 10 feet in parallel bars, 5/24: min A walking 10 feet in parallel bars; 6/21: no change 7/14: no change. 03/01/2020-  Patient unable to ambulate and currently focusing on static standing. He was able to take a forward step with his right LE last week but unable today. 03/31/2020- Patient continues to be non-ambulatory but has been able to move right LE forward with standing but unable to take a step with left LE.    Time 12     Period Weeks    Status Deferred    Target Date 04/26/20      PT LONG TERM GOAL #6   Title Patient will be able to safety navigate up/down ramp with power chair, exhibiting good safety awareness, mod I to safely enter/exit his home.    Baseline 2/11: supervision with intermittent min A;4/1: supervision; 5/24: supervision. 03/01/2020- patient reports that he is able to navigate down the ramp at home to leave the home but continues to need assist to navigate up the ramp into the home.    Time 12    Period Weeks    Status Partially Met    Target Date 04/26/20      PT LONG TERM GOAL #7   Title Patient will increase functional reach test to >15 inches in sitting to exhibit improved sitting balance and positioning and reduce fall risk;    Baseline 07/30/18: 12 inches; 09/11/18: 10-12 inches; 10/23/18: 12-13 inch; 10/12: 14.5", 10/29: 15.5 inch    Time 4    Period Weeks    Status Achieved      PT LONG TERM GOAL #8   Title Patient will increase LLE quad strength to 3+/5 to improve functional strength for standing and mobility;    Baseline 10/28: 2+/5; 01/13/19: 2+/5, 2/11: 3-/5, 04/16/19: 3-/5, 05/08/19: 3-/5, 5/24: 3-/5, 6/21: 3-/5, 7/14: 3-/5, 12/21: 3-/5. 03/01/2020- Left quad= 3-/5 (focusing on quad strengthening during visits and with home program). 03/31/2020- left quad = 3-/5 with MMT.    Time 12    Period Weeks    Status On-going      PT LONG TERM GOAL  #9   TITLE Patient will report no vertigo with provoking motions or positions in order to be able to perform ADLs and mobility tasks without symptoms.    Baseline 06/30/19: reports no dizziness in last week; 6/21: one episode this weekend; 7/14: experiencing one a week; 03/01/2020.- Patient has denied any dizziness for over past 3 weeks.    Time 12    Period Weeks    Status Achieved      PT LONG TERM GOAL  #10   TITLE Patient will demonstrate improved functional LE/Postural strength as seen by ability to perform static standing for 2 min or  greater with max assist  to assist with strength required for ADL's and transfers/pregait/gait activities.    Baseline 03/01/2020- Patient able to stand approx 40 sec today with max assist in parallel bars. 03/31/2020- Patient able to demo static stand with max A for 23min 47 sec today.    Time 8    Period Weeks    Target Date 04/26/20                 Plan - 04/01/20 1507    Clinical Impression Statement Patient presents with increased right LE weakness which inhibited his standing endurance today. He continued to exhibit better gluteal activation with verbal cueing but decreased overall standing time. He was not able to lateral weight shift or take a step with either leg today. He was visibally fatigued at end of session and unable to even sit up straight  to perform seated postural exercises. He will continue to benefit from skilled PT services to improve his functional strength and postural control for improved transfers/standing endurance.    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    Examination-Activity Limitations Bed Mobility;Bend;Sit;Toileting;Stand;Stairs;Lift;Transfers;Squat;Locomotion Level;Carry;Dressing;Hygiene/Grooming;Continence    Examination-Participation Restrictions Yard Work;Interpersonal Relationship;Community Activity    Stability/Clinical Decision Making Evolving/Moderate complexity    Rehab Potential Fair    PT Frequency 3x / week     PT Duration 12 weeks    PT Treatment/Interventions ADLs/Self Care Home Management;Electrical Stimulation;Moist Heat;Cryotherapy;Gait training;Stair training;Functional mobility training;Therapeutic exercise;Balance training;Neuromuscular re-education;Cognitive remediation;Patient/family education;Orthotic Fit/Training;Wheelchair mobility training;Manual techniques;Passive range of motion;Energy conservation;Joint Manipulations;Canalith Repostioning;Therapeutic activities;Vestibular    PT Next Visit Plan Continue with progressive postural/LE strengthening.    PT Home Exercise Plan no changes    Consulted and Agree with Plan of Care Patient;Family member/caregiver    Family Member Consulted Caregiver- [place]           Patient will benefit from skilled therapeutic intervention in order to improve the following deficits and impairments:  Abnormal gait,Decreased activity tolerance,Decreased cognition,Decreased endurance,Decreased knowledge of use of DME,Decreased range of motion,Decreased skin integrity,Decreased strength,Impaired perceived functional ability,Impaired sensation,Impaired UE functional use,Improper body mechanics,Pain,Cardiopulmonary status limiting activity,Decreased balance,Decreased coordination,Decreased mobility,Difficulty walking,Impaired tone,Postural dysfunction,Dizziness  Visit Diagnosis: Muscle weakness (generalized)  Other lack of coordination  Hemiplegia and hemiparesis following cerebral infarction affecting left non-dominant side ([place])  Difficulty in walking, not elsewhere classified     Problem List Patient Active Problem List   Diagnosis Date Noted  . Sepsis ([place]) 10/18/2018    Lewis Moccasin, PT 04/01/2020, 5:07 PM  [place] MAIN [facility] SERVICES [address] [place], [place], [address] Phone: [phone]   Fax:  [phone]  Name: OMERO KOWAL MRN: [id number] Date of Birth: 04/06/1945

## 2020-04-01 NOTE — Therapy (Signed)
[place] MAIN [facility] SERVICES [address] [place], [place], [address] Phone: [phone]   Fax:  [phone]  Occupational Therapy Treatment  Patient Details  Name: Kenneth Summers MRN: [id number] Date of Birth: July 10, 1945 No data recorded  Encounter Date: 04/01/2020   OT End of Session - 04/01/20 1308    Visit Number [id number]    Number of Visits 205    Date for OT Re-Evaluation 05/24/20    Authorization Type Progress report period starting 03/11/2020    OT Start Time 1300    OT Stop Time 1345    OT Time Calculation (min) 45 min    Activity Tolerance Patient tolerated treatment well    Behavior During Therapy [facility] for tasks assessed/performed           Past Medical History:  Diagnosis Date  . Actinic keratosis   . Alcohol abuse   . APS (antiphospholipid syndrome) ([place])   . Basal cell carcinoma   . BPH (benign prostatic hyperplasia)   . CAD (coronary artery disease)   . Cellulitis   . Cervical spinal stenosis   . Chronic osteomyelitis ([place])   . CKD (chronic kidney disease)   . CKD (chronic kidney disease)   . Clostridium difficile diarrhea   . Depression   . Diplopia   . Fatigue   . GERD (gastroesophageal reflux disease)   . Hyperlipemia   . Hypertension   . Hypovitaminosis D   . IBS (irritable bowel syndrome)   . 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 Insomnia   . Left lumbosacral radiculopathy   . Moderate obstructive sleep apnea   . Osteomyelitis of foot ([place])   . PAD (peripheral artery disease) ([place])   . Pruritus   . RA (rheumatoid arthritis) ([place])   . Radiculopathy   . Restless legs syndrome   . RLS (restless legs syndrome)   . SLE (systemic lupus erythematosus related syndrome) ([place])   . 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 Stroke ([place])   . Toxic maculopathy of both eyes     Past Surgical History:  Procedure Laterality Date  . CARDIAC CATHETERIZATION    . CAROTID ENDARTERECTOMY    .  CERVICAL LAMINECTOMY    . CORONARY ANGIOPLASTY    . FRACTURE SURGERY    . fusion C5-6-7    . HERNIA REPAIR    . KNEE ARTHROSCOPY    . TONSILLECTOMY      There were no vitals filed for this visit.   Subjective Assessment - 04/01/20 1307    Subjective  Pt. reports that he is doing well    Patient is accompanied by: Family member    Pertinent History Pt. is a 75 y.o. 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 Pt. PMHx includes: Multiple Falls, Lupus, DJD, Renal Abscess, CVA. Pt. resides with his wife. Pt.'s wife and daughter assist with ADLs. Pt. has caregivers in  for 2 hours a day, 6 days a week. Pt. received Rehab services in acute care, at SNF for STR, and [place] services. Pt. is retired from [employer] for [employer] and [employer].    Currently in Pain? No/denies          OT TREATMENT   Therapeutic Exercise:  Pt. worked on [place] for reciprocal motion. Pt. performed2#dowelfor UE strengthening secondary to weakness.Pt.tolerated increased dowel weight.Bilateral shoulder flexion, chest press,andcircular patterns were  performedfor1-2 sets10reps each.Pt. worked on3# dumbbell ex. forbilateralelbow flexion and extension, 3# for rightand2#left.Pt. performedforearm supination/pronation, wrist flexion/extension 3# for the RUE, and 2# with the left for1 set of10-20and radial deviation 1 set 20 reps. Tactile and verbal cues were required for eliciting the desired movement, and correct hand position.  Pt.continuestomakesteadyprogress with BUE strength, and tolerance for there. ex.Pt. Required less cues todayPt.continues to require consistent verbal, visual, and tactile cues.Pt. Did require cues for radial deviation. Pt. required a pillow for lateral support at the left to assume midline position. Pt.continues torequire less cuesto perform UE strengthening exercises, technique, and formas well  as visual demonstration prior to the exercises.Pt. continues to work on improving BUE strength, and [facility] skills in order to improve LUE functioning during ADLs, IADLs, and tranfers.                        OT Education - 04/01/20 1308    Education Details strengthening, UE functioning    Person(s) Educated Patient    Methods Explanation;Demonstration    Comprehension Returned demonstration;Tactile cues required;Verbalized understanding;Need further instruction               OT Long Term Goals - 03/11/20 1315      OT LONG TERM GOAL #1   Title Pt. will increase left shoulder flexion AROM by 10 degrees to access cabinet/shelf.    Baseline Pt. conitnues to present with limited left shoulder ROM limitng access to cabinets, and shelves.03/01/2020: Left shoulder 116 degrees with new onset of numbness radiating from the left side of the neck to the hand. Pt. continues to progress with left shoulder ROM .Shouler flexion 130, and continues to work on reaching with increasing emphasis on flexing trunk to improve functional reach.  07/21/19- shoulder flexion to 130 with effort    Time 12    Period Weeks    Status On-going    Target Date 05/24/20      OT LONG TERM GOAL #2   Title Pt. will donn a shirt with Supervision.    Baseline Pt. continues to require modA donning a zip down jacket with consistent cues to initiate the correct direction of the jacket.  Min-modA mahaging the zipper. MinA for donning a T-shirt with assist to roll side to side to pull the shirt down over his back. 07/21/19 continues to require min assist at times for shirt/jacket    Time 12    Period Weeks    Status On-going    Target Date 05/24/20      OT LONG TERM GOAL #4   Title Pt. will improve left grip strength by 10# to be able to open a jar/container.    Baseline Grip strength conitnues to be limited. Decreased left sided grip strength today secondary to a new onset of numbenss, and weakness  radiating from the neck to the left hand. Pt. continues to have a harder time opening a wide mouthed jar, however is able to open a smaller jar easier.    Time 12    Period Weeks    Status On-going    Target Date 05/24/20      OT LONG TERM GOAL #5   Title Pt. will improve left hand [facility] skills to be able to assist with buttoning/zipping.    Baseline Pt. conitnues to work on improving [facility] skills  to improve buttoning , and zipping.03/01/2020: Pt.has had a new onset of weakness, and numbness in the left hand beginning upon  waking up this morning which has resulted in a significant change in [facility] skills.  Pt. has improved overall donning a jacket, and  manipulating the zipper on his jacket, however continues to require practice depending on the type of zipper jacket. Pt. continues to work on improving bilateral [facility] skills for buttoning/zipping.  07/21/19 improving with buttons and zippers.    Time 12    Period Weeks    Status On-going    Target Date 05/24/20      OT LONG TERM GOAL #6   Title Pt. will demonstrate visual conmpensatory strategies for 100% of the time during ADLs    Baseline Improving, however Pt. continues to require intermittent cues cues for left sides awareness.    Time 12    Period Weeks    Status On-going      OT LONG TERM GOAL #8   Title Pt. will accurately identify potential safety hazard using good safety awareness, and judgement 100% for ADLs, and IADLs.    Baseline Pt. continues to require cues    Time 12    Period Weeks    Status On-going    Target Date 05/24/20      OT LONG TERM GOAL  #9   TITLE Pt. will  navigate his w/c around obstacles with Supervision and 100% accuracy    Baseline Pt. continues to require cuing for obstacles on the left, however requires less cues overall. Pt. requires significant cues to engage his left UE during tasks. 07/21/2019 continues to improve with decreased assist at times    Time 12    Period Weeks    Status On-going    Target Date  05/24/20      OT LONG TERM GOAL  #10   TITLE Pt. will independently be able  to reach to place items into cabinetry, and closets.    Baseline Pt. continues to have difficulty with functional reaching conitnues to be limited.    Time 12    Period Weeks    Status On-going    Target Date 05/24/20      OT LONG TERM GOAL  #11   TITLE Pt. will increase BUE strength by 38mm grades in preparation for ADL transfers.    Baseline Pt. BUE strength conitnues to be limited. Pt. is able to tolerate increased resistive weights. Pt. continues to work towards improving overall strength for ADLs.    Time 12    Period Weeks    Status On-going    Target Date 05/24/20      OT LONG TERM GOAL  #12   TITLE Pt. will improve LUE functional reaching with the left engaging the trunk 100% of the time during ADL tasks with minimal cues.    Baseline Left sided numbness has improved. New onset of left sided numbness, and weakness is affecting his ability to perform reaching.    Time 12    Period Weeks    Status On-going    Target Date 05/24/20                 Plan - 04/01/20 1310    Clinical Impression Statement Pt.continuestomakesteadyprogress with BUE strength, and tolerance for there. ex.Pt. Required less cues todayPt.continues to require consistent verbal, visual, and tactile cues.Pt. Did require cues for radial deviation. Pt. required a pillow for lateral support at the left to assume midline position. Pt.continues torequire less cuesto perform UE strengthening exercises, technique, and formas well as visual demonstration prior to the exercises.Pt. continues to  work on improving BUE strength, and [facility] skills in order to improve LUE functioning during ADLs, IADLs, and tranfers.   OT Occupational Profile and History Problem Focused Assessment - Including review of records relating to presenting problem    Occupational performance deficits (Please refer to evaluation for details): ADL's    Body  Structure / Function / Physical Skills UE functional use;Coordination;FMC;Dexterity;Strength;ROM    Cognitive Skills Attention;Memory;Emotional;Problem Solve;Safety Awareness    Rehab Potential Good    Comorbidities Affecting Occupational Performance: Presence of comorbidities impacting occupational performance    Modification or Assistance to Complete Evaluation  No modification of tasks or assist necessary to complete eval    OT Frequency 2x / week    OT Duration 12 weeks    OT Treatment/Interventions Self-care/ADL training;DME and/or AE instruction;Therapeutic exercise;Therapeutic activities;Moist Heat;Cognitive remediation/compensation;Neuromuscular education;Visual/perceptual remediation/compensation;Coping strategies training;Patient/family education;Passive range of motion;Psychosocial skills training;Energy conservation;Functional Mobility Training    Consulted and Agree with Plan of Care Patient;Family member/caregiver           Patient will benefit from skilled therapeutic intervention in order to improve the following deficits and impairments:   Body Structure / Function / Physical Skills: UE functional use,Coordination,FMC,Dexterity,Strength,ROM Cognitive Skills: Attention,Memory,Emotional,Problem Solve,Safety Awareness     Visit Diagnosis: Muscle weakness (generalized)  Other lack of coordination    Problem List Patient Active Problem List   Diagnosis Date Noted  . Sepsis ([place]) 10/18/2018    Harrel Carina, MS, OTR/L 04/01/2020, 1:16 PM  [place] MAIN [facility] SERVICES [address] [place], [place], [address] Phone: [phone]   Fax:  [phone]  Name: Kenneth Summers MRN: [id number] Date of Birth: August 03, 1945

## 2020-04-05 ENCOUNTER — Encounter: Payer: Self-pay | Admitting: Occupational Therapy

## 2020-04-05 ENCOUNTER — Ambulatory Visit: Payer: Medicare Other | Admitting: Occupational Therapy

## 2020-04-05 ENCOUNTER — Other Ambulatory Visit: Payer: Self-pay

## 2020-04-05 ENCOUNTER — Ambulatory Visit: Payer: Medicare Other

## 2020-04-05 DIAGNOSIS — R262 Difficulty in walking, not elsewhere classified: Secondary | ICD-10-CM

## 2020-04-05 DIAGNOSIS — M6281 Muscle weakness (generalized): Secondary | ICD-10-CM

## 2020-04-05 DIAGNOSIS — R278 Other lack of coordination: Secondary | ICD-10-CM

## 2020-04-05 DIAGNOSIS — I69354 Hemiplegia and hemiparesis following cerebral infarction affecting left non-dominant side: Secondary | ICD-10-CM

## 2020-04-05 NOTE — Therapy (Signed)
[place] MAIN [facility] SERVICES [address] [place], [place], [address] Phone: [phone]   Fax:  [phone]  Occupational Therapy Treatment  Patient Details  Name: Kenneth Summers MRN: [id number] Date of Birth: 1945-09-18 No data recorded  Encounter Date: 04/05/2020   OT End of Session - 04/05/20 1355    Visit Number [id number]    Number of Visits 205    Date for OT Re-Evaluation 05/24/20    OT Start Time 1345    Activity Tolerance Patient tolerated treatment well    Behavior During Therapy [facility] for tasks assessed/performed           Past Medical History:  Diagnosis Date  . Actinic keratosis   . Alcohol abuse   . APS (antiphospholipid syndrome) ([place])   . Basal cell carcinoma   . BPH (benign prostatic hyperplasia)   . CAD (coronary artery disease)   . Cellulitis   . Cervical spinal stenosis   . Chronic osteomyelitis ([place])   . CKD (chronic kidney disease)   . CKD (chronic kidney disease)   . Clostridium difficile diarrhea   . Depression   . Diplopia   . Fatigue   . GERD (gastroesophageal reflux disease)   . Hyperlipemia   . Hypertension   . Hypovitaminosis D   . IBS (irritable bowel syndrome)   . 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 Insomnia   . Left lumbosacral radiculopathy   . Moderate obstructive sleep apnea   . Osteomyelitis of foot ([place])   . PAD (peripheral artery disease) ([place])   . Pruritus   . RA (rheumatoid arthritis) ([place])   . Radiculopathy   . Restless legs syndrome   . RLS (restless legs syndrome)   . SLE (systemic lupus erythematosus related syndrome) ([place])   . 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 Stroke ([place])   . Toxic maculopathy of both eyes     Past Surgical History:  Procedure Laterality Date  . CARDIAC CATHETERIZATION    . CAROTID ENDARTERECTOMY    . CERVICAL LAMINECTOMY    . CORONARY ANGIOPLASTY    . FRACTURE SURGERY    . fusion C5-6-7    . HERNIA REPAIR    . KNEE  ARTHROSCOPY    . TONSILLECTOMY      There were no vitals filed for this visit.   Subjective Assessment - 04/05/20 1349    Subjective  Pt. reports that he watched TV and some movies he didn't like that his wife picked.    Patient is accompanied by: Family member    Pertinent History Pt. is a 75 y.o. 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 Pt. PMHx includes: Multiple Falls, Lupus, DJD, Renal Abscess, CVA. Pt. resides with his wife. Pt.'s wife and daughter assist with ADLs. Pt. has caregivers in  for 2 hours a day, 6 days a week. Pt. received Rehab services in acute care, at SNF for STR, and [place] services. Pt. is retired from [employer] for [employer] and [employer].    Patient Stated Goals To regain use of his left UE, and do more for himself.    Currently in Pain? No/denies    Pain Onset Today    Pain Onset Today            Therapeutic Exercise Pt performed seated 2# dowel exercises for UE strengthening secondary to weakness. 2 sets x 10 reps  each BUE shoulder flexion, chest press, circular patterns, wrist flexion/etension, and elbow flexion/extension were performed. Pt requires cues for attention to task and benefited from visual demonstration of exercises.  3# dumbbell for L elbow flexion and extension, 4# for R elbow flexion/extension. 1.5# ankle weight on RUE for supination/pronation, body weight for LUE. Pt self-initiates rest breaks and LUE tremor increases with fatigue. Pt completed alternating functional reach outside base of support to target of alternating heights, pt tolerated 1.5 weight strap on RUE and body weight on LUE.    Therapeutic Activity Pt worked on reaching for [employer], and moving them through 4 levels of horizontal rungs placed at progressively higher heights. Pt completed all 4 heights using RUE, incorporated LUE to assist for lowest level. Pt completed 2 trials using LUE only to move rungs along second lowest level -  requires RUE assist to switch between rungs. Pt demonstrates difficulty maintaining grasp on Saebo rings using LUE only.            OT Education - 04/05/20 1354    Education Details strengthening, UE functioning    Person(s) Educated Patient;Spouse    Methods Explanation;Demonstration    Comprehension Returned demonstration;Tactile cues required;Verbalized understanding;Need further instruction               OT Long Term Goals - 03/11/20 1315      OT LONG TERM GOAL #1   Title Pt. will increase left shoulder flexion AROM by 10 degrees to access cabinet/shelf.    Baseline Pt. conitnues to present with limited left shoulder ROM limitng access to cabinets, and shelves.03/01/2020: Left shoulder 116 degrees with new onset of numbness radiating from the left side of the neck to the hand. Pt. continues to progress with left shoulder ROM .Shouler flexion 130, and continues to work on reaching with increasing emphasis on flexing trunk to improve functional reach.  07/21/19- shoulder flexion to 130 with effort    Time 12    Period Weeks    Status On-going    Target Date 05/24/20      OT LONG TERM GOAL #2   Title Pt. will donn a shirt with Supervision.    Baseline Pt. continues to require modA donning a zip down jacket with consistent cues to initiate the correct direction of the jacket.  Min-modA mahaging the zipper. MinA for donning a T-shirt with assist to roll side to side to pull the shirt down over his back. 07/21/19 continues to require min assist at times for shirt/jacket    Time 12    Period Weeks    Status On-going    Target Date 05/24/20      OT LONG TERM GOAL #4   Title Pt. will improve left grip strength by 10# to be able to open a jar/container.    Baseline Grip strength conitnues to be limited. Decreased left sided grip strength today secondary to a new onset of numbenss, and weakness radiating from the neck to the left hand. Pt. continues to have a harder time opening a  wide mouthed jar, however is able to open a smaller jar easier.    Time 12    Period Weeks    Status On-going    Target Date 05/24/20      OT LONG TERM GOAL #5   Title Pt. will improve left hand [facility] skills to be able to assist with buttoning/zipping.    Baseline Pt. conitnues to work on improving [facility] skills  to improve buttoning ,  and zipping.03/01/2020: Pt.has had a new onset of weakness, and numbness in the left hand beginning upon waking up this morning which has resulted in a significant change in [facility] skills.  Pt. has improved overall donning a jacket, and  manipulating the zipper on his jacket, however continues to require practice depending on the type of zipper jacket. Pt. continues to work on improving bilateral [facility] skills for buttoning/zipping.  07/21/19 improving with buttons and zippers.    Time 12    Period Weeks    Status On-going    Target Date 05/24/20      OT LONG TERM GOAL #6   Title Pt. will demonstrate visual conmpensatory strategies for 100% of the time during ADLs    Baseline Improving, however Pt. continues to require intermittent cues cues for left sides awareness.    Time 12    Period Weeks    Status On-going      OT LONG TERM GOAL #8   Title Pt. will accurately identify potential safety hazard using good safety awareness, and judgement 100% for ADLs, and IADLs.    Baseline Pt. continues to require cues    Time 12    Period Weeks    Status On-going    Target Date 05/24/20      OT LONG TERM GOAL  #9   TITLE Pt. will  navigate his w/c around obstacles with Supervision and 100% accuracy    Baseline Pt. continues to require cuing for obstacles on the left, however requires less cues overall. Pt. requires significant cues to engage his left UE during tasks. 07/21/2019 continues to improve with decreased assist at times    Time 12    Period Weeks    Status On-going    Target Date 05/24/20      OT LONG TERM GOAL  #10   TITLE Pt. will independently be able  to reach  to place items into cabinetry, and closets.    Baseline Pt. continues to have difficulty with functional reaching conitnues to be limited.    Time 12    Period Weeks    Status On-going    Target Date 05/24/20      OT LONG TERM GOAL  #11   TITLE Pt. will increase BUE strength by 32mm grades in preparation for ADL transfers.    Baseline Pt. BUE strength conitnues to be limited. Pt. is able to tolerate increased resistive weights. Pt. continues to work towards improving overall strength for ADLs.    Time 12    Period Weeks    Status On-going    Target Date 05/24/20      OT LONG TERM GOAL  #12   TITLE Pt. will improve LUE functional reaching with the left engaging the trunk 100% of the time during ADL tasks with minimal cues.    Baseline Left sided numbness has improved. New onset of left sided numbness, and weakness is affecting his ability to perform reaching.    Time 12    Period Weeks    Status On-going    Target Date 05/24/20                 Plan - 04/05/20 1355    Clinical Impression Statement Pt. continues to make steady progress with BUE strength, and tolerance for there. ex. Pt. Required less cues today for attention to task. Pt. continues to require consistent verbal, visual, and tactile cues. Pt demonstrated improved LUE AROM using Saebo rings today. Pt. continues to  require less cues to perform UE strengthening exercises, technique, and form as well as visual demonstration prior to the exercises. Pt. continues to work on improving BUE strength, and [facility] skills in order to improve LUE functioning during ADLs, IADLs, and tranfers.    OT Occupational Profile and History Problem Focused Assessment - Including review of records relating to presenting problem    Occupational performance deficits (Please refer to evaluation for details): ADL's    Body Structure / Function / Physical Skills UE functional use;Coordination;FMC;Dexterity;Strength;ROM    Cognitive Skills  Attention;Memory;Emotional;Problem Solve;Safety Awareness    Rehab Potential Good    Comorbidities Affecting Occupational Performance: Presence of comorbidities impacting occupational performance    Modification or Assistance to Complete Evaluation  No modification of tasks or assist necessary to complete eval    OT Frequency 2x / week    OT Duration 12 weeks    OT Treatment/Interventions Self-care/ADL training;DME and/or AE instruction;Therapeutic exercise;Therapeutic activities;Moist Heat;Cognitive remediation/compensation;Neuromuscular education;Visual/perceptual remediation/compensation;Coping strategies training;Patient/family education;Passive range of motion;Psychosocial skills training;Energy conservation;Functional Mobility Training    Consulted and Agree with Plan of Care Patient;Family member/caregiver           Patient will benefit from skilled therapeutic intervention in order to improve the following deficits and impairments:   Body Structure / Function / Physical Skills: UE functional use,Coordination,FMC,Dexterity,Strength,ROM Cognitive Skills: Attention,Memory,Emotional,Problem Solve,Safety Awareness     Visit Diagnosis: Other lack of coordination  Hemiplegia and hemiparesis following cerebral infarction affecting left non-dominant side [facility])    Problem List Patient Active Problem List   Diagnosis Date Noted  . Sepsis ([place]) 10/18/2018    Dessie Coma, M.S. OTR/L  04/05/20, 2:15 PM  ascom [phone]  [place] MAIN [facility] SERVICES [address] [place], [place], [address] Phone: [phone]   Fax:  [phone]  Name: SHERMON BOZZI MRN: [id number] Date of Birth: 1945/07/06

## 2020-04-05 NOTE — Therapy (Signed)
[place] MAIN [facility] SERVICES [address] [place], [place], [address] Phone: [phone]   Fax:  [phone]  Physical Therapy Treatment  Patient Details  Name: Kenneth Summers MRN: [id number] Date of Birth: August 17, 1945 No data recorded  Encounter Date: 04/05/2020   PT End of Session - 04/05/20 1353    Visit Number [id number]    Number of Visits 252    Date for PT Re-Evaluation 04/19/20    Authorization Type Medicare reporting period starting 09/11/18; NO FOTO    Authorization Time Period goals last assessed on 03/01/2020    PT Start Time 1258    PT Stop Time 1342    PT Time Calculation (min) 44 min    Equipment Utilized During Treatment Gait belt    Activity Tolerance No increased pain;Patient limited by fatigue;Patient tolerated treatment well    Behavior During Therapy [facility] for tasks assessed/performed           Past Medical History:  Diagnosis Date  . Actinic keratosis   . Alcohol abuse   . APS (antiphospholipid syndrome) ([place])   . Basal cell carcinoma   . BPH (benign prostatic hyperplasia)   . CAD (coronary artery disease)   . Cellulitis   . Cervical spinal stenosis   . Chronic osteomyelitis ([place])   . CKD (chronic kidney disease)   . CKD (chronic kidney disease)   . Clostridium difficile diarrhea   . Depression   . Diplopia   . Fatigue   . GERD (gastroesophageal reflux disease)   . Hyperlipemia   . Hypertension   . Hypovitaminosis D   . IBS (irritable bowel syndrome)   . 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 Insomnia   . Left lumbosacral radiculopathy   . Moderate obstructive sleep apnea   . Osteomyelitis of foot ([place])   . PAD (peripheral artery disease) ([place])   . Pruritus   . RA (rheumatoid arthritis) ([place])   . Radiculopathy   . Restless legs syndrome   . RLS (restless legs syndrome)   . SLE (systemic lupus erythematosus related syndrome) ([place])   . 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 Stroke  ([place])   . Toxic maculopathy of both eyes     Past Surgical History:  Procedure Laterality Date  . CARDIAC CATHETERIZATION    . CAROTID ENDARTERECTOMY    . CERVICAL LAMINECTOMY    . CORONARY ANGIOPLASTY    . FRACTURE SURGERY    . fusion C5-6-7    . HERNIA REPAIR    . KNEE ARTHROSCOPY    . TONSILLECTOMY      There were no vitals filed for this visit.   Subjective Assessment - 04/05/20 1507    Subjective Patient reports doing well today but had a rough weekend - states he is in good spirits today and ready to work.    Patient is accompained by: Family member    Pertinent History Patient is a 75 y.o. male who presents to outpatient physical therapy with a referral for medical diagnosis of CVA. This patient's chief complaints consist of left hemiplegia and overall deconditioning leading to the following functional deficits: dependent for ADLs, IADLs, unable to transfer to car, dependent transfers at home with hoyer lift, unable to walk or stand without significant assistance, difficulty with W/C navigation..    Limitations Sitting;Lifting;Standing;House hold activities;Writing;Walking;Other (comment)    Diagnostic tests Brain MRI 11/01/2017: IMPRESSION: 1. Acute right ACA territory infarct as  demonstrated on prior CT imaging. No intracranial hemorrhage or significant mass effect. 2. 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    Patient Stated Goals wants to be able to walk again    Currently in Pain? No/denies    Pain Onset Today    Pain Onset Today            Treatment:  Seated Kee ext- kicking a red cone strategically placed in various spots on floor and instructed patient to "kick the cone" - Patient with no difficulty kicking with right LE and was able to perform and improve with kicking the cone using left LE with VCs to focus as patient remains easily distracted. He experienced difficulty with medial kicks due to poor adductor/med quad strength.  Total time of activity= 38min  With approx 30-35 kicks each leg. Patient limited due to fatigue.   Seated hip march AROM BLE x 10 reps x  2 sets with minimal Left LE movement - starting off with more range of motion yet fatigues quickly requiring constant VC to remain engaged.   Static Sitting posture x 5 min with no back support- patient performed well today as he did not lay back (slump) at all during session today for the 1st time. He was able to then participate in dynamic UE reaching with B UE overhead, shoulder level and below shoulder level with 1 UE support on //bars.   Dynamic trunk rotation as PT stood behind patient and positioned posteriolaterally to alternating left and right side while holding up a number and having patient turn to his left an to his right calling out the number with erect posture and no slouching into seat.  Forward trunk lean and back to neutral focusing on hand position and leaning forward while keeping head up x 15 reps- multiple VC and reminders to look ahead but patient able to lean forward with only supervision and cues.   Sit to stand activity- Patient able to perform sit to stand with less verbal cues after above activity and performed 8 sit to stand with max assist yet decreased overall cueing for correct technique with practice.                          PT Education - 04/05/20 1737    Education Details specific exercise and posture cues in sitting. Transfer cues for safe sit to stand.    Person(s) Educated Patient    Methods Explanation;Demonstration;Tactile cues;Verbal cues    Comprehension Verbalized understanding;Returned demonstration;Verbal cues required;Tactile cues required;Need further instruction            PT Short Term Goals - 01/26/20 1308      PT SHORT TERM GOAL #1   Title Be independent with initial home exercise program for self-management of symptoms.    Baseline HEP to be given at second session; advice to  practice unsupported sitting at home (06/19/2018); 07/08/18: Performing at home, 6/29: needs assistance but is doing exercise program regularly; 08/22/18: adherent;    Time 2    Period Weeks    Status Achieved    Target Date 06/26/18             PT Long Term Goals - 03/31/20 1343      PT LONG TERM GOAL #1   Title Patient will complete all bed mobility with min A to improve functional independence for getting in and out of bed and adjusting in bed.  Baseline max A - total A reported by family (06/19/2018); 07/08/18: still requires maxA, dtr reports improvement in rolling since starting therapy; 6/23: min A to supervision in clinic; not doing at home right; 8/5: Pt's daughter states that his rolling has improved, but still has difficulty with all other bed mobility; 10/23/18: pts daughter states that she is doing about 35% of the work if he is in proper position, he does uses the bed rails to help roll;  using Harrel Lemon for coming to sit EOB; 10/12: pts daughter states that she is doing about 30% of the work if he is in proper position, he does uses the bed rails to help roll, but it is improving;  using Harrel Lemon for coming to sit EOB, 10/29: min A for sit to sidelying, rolling supervision, able to transition sidelying to sit with HHA; 12/21: min-mod A. 03/01/2020- Patient presents improving bed mobility with current ability to roll to left with CGA and verbal and tactile cues and min assist to roll to right as seen by recent visits. 03/31/2020- Patient and caregiver report that he has maintained ability to roll side to side with only minimal assistance.    Time 12    Period Weeks    Status Achieved    Target Date 04/26/20      PT LONG TERM GOAL #2   Title Patient will complete sit <> stand transfer chair to chair with Min A and LRAD to improve functional independence for household and community mobility.     Baseline min A for STS, still requires elevated surface height to 27".  03/01/20- Patient currently  unable to perform a chair to chair transfer and continues to be transferred using hoyer lift. Patient has been training with sit to stand at parallel bars and able to currently perform sit to stand with max assist  from 27in. high surface.03/31/2020- Patient remains at [facility] assist with sit to stand transfer and continued use of hoyer lift transfer for all chair to chair or bed to chair transfers.    Time 12    Period Weeks    Status On-going    Target Date 04/26/20      PT LONG TERM GOAL #3   Title Patient will navigate power w/c with min A x 100 feet to improve mobility for household and short community distances.    Baseline required total A (06/12/2018); able to roll 20 feet with min A (06/20/2018); 07/08/18: Pt can go approximately 10' without assist per family;  10/23/2018: pt's daughter states that his power WC mobility has become 100% better with the new hand control, slowed speed, and with practice; 10/12: pt's daughter reports that he can perform household WC mobility and limited community ambulation with minA    Time 12    Period Weeks    Status Achieved    Target Date 04/26/20      PT LONG TERM GOAL #4   Title Patient will complete car transfers W/C to family SUV with min A using LRAD to improve his abilty to participate in community activities.     Baseline unable to complete car transfers at all (06/12/2018); unable to complete car transfers at all (06/19/2018).; 07/08/18: Unable to perform at this time; 09/11/18: unable to perform at this time; 10/23/2018: unable to complete car transfers at all; 10/12:  unable to complete car transfers at all, 10/29: unable; 01/13/19: unable; 2/11: min-mod A +2, 3/10: min A+1, 12/21: mod A. 03/01/2020- Patient unable to complete car transfers  and continues to require medical transportation at this time.    Time 12    Period Weeks      PT LONG TERM GOAL #5   Title Patient will ambulate at least 10 feet with min A using LRAD to improve mobility for household and  community distances.    Baseline unable to take steps (06/12/2018); able to shift R leg forward and back with max A and RW at edge of plinth (06/19/2018); 07/08/18: unable to ambulate at this time. 09/11/18: unable to perform at this time; 10/23/2018: unable to perform at this time; 10/12: unable to perform at this time, 10/29: unable at this time; 01/13/19: unable at this time, 2/11: min A to step with RLE, unable to step with LLE, 3/10: able to take 2 steps in parallel bars with min A for safety; 4/1: min A for walking 10 feet in parallel bars, 5/24: min A walking 10 feet in parallel bars; 6/21: no change 7/14: no change. 03/01/2020- Patient unable to ambulate and currently focusing on static standing. He was able to take a forward step with his right LE last week but unable today. 03/31/2020- Patient continues to be non-ambulatory but has been able to move right LE forward with standing but unable to take a step with left LE.    Time 12    Period Weeks    Status Deferred    Target Date 04/26/20      PT LONG TERM GOAL #6   Title Patient will be able to safety navigate up/down ramp with power chair, exhibiting good safety awareness, mod I to safely enter/exit his home.    Baseline 2/11: supervision with intermittent min A;4/1: supervision; 5/24: supervision. 03/01/2020- patient reports that he is able to navigate down the ramp at home to leave the home but continues to need assist to navigate up the ramp into the home.    Time 12    Period Weeks    Status Partially Met    Target Date 04/26/20      PT LONG TERM GOAL #7   Title Patient will increase functional reach test to >15 inches in sitting to exhibit improved sitting balance and positioning and reduce fall risk;    Baseline 07/30/18: 12 inches; 09/11/18: 10-12 inches; 10/23/18: 12-13 inch; 10/12: 14.5", 10/29: 15.5 inch    Time 4    Period Weeks    Status Achieved      PT LONG TERM GOAL #8   Title Patient will increase LLE quad strength to 3+/5 to  improve functional strength for standing and mobility;    Baseline 10/28: 2+/5; 01/13/19: 2+/5, 2/11: 3-/5, 04/16/19: 3-/5, 05/08/19: 3-/5, 5/24: 3-/5, 6/21: 3-/5, 7/14: 3-/5, 12/21: 3-/5. 03/01/2020- Left quad= 3-/5 (focusing on quad strengthening during visits and with home program). 03/31/2020- left quad = 3-/5 with MMT.    Time 12    Period Weeks    Status On-going      PT LONG TERM GOAL  #9   TITLE Patient will report no vertigo with provoking motions or positions in order to be able to perform ADLs and mobility tasks without symptoms.    Baseline 06/30/19: reports no dizziness in last week; 6/21: one episode this weekend; 7/14: experiencing one a week; 03/01/2020.- Patient has denied any dizziness for over past 3 weeks.    Time 12    Period Weeks    Status Achieved      PT LONG TERM GOAL  #10   TITLE  Patient will demonstrate improved functional LE/Postural strength as seen by ability to perform static standing for 2 min or greater with max assist  to assist with strength required for ADL's and transfers/pregait/gait activities.    Baseline 03/01/2020- Patient able to stand approx 40 sec today with max assist in parallel bars. 03/31/2020- Patient able to demo static stand with max A for 62min 47 sec today.    Time 8    Period Weeks    Target Date 04/26/20                 Plan - 04/05/20 1740    Clinical Impression Statement Patient was able to utilize his left LE better today and focus on his motion better than in previous sesssion. He was able to maintain upright seated posture without back support for approx 10 min today. He also demo improved ability to perform sit to stand transfers He will continue to benefit from skilled PT services to improve his functional strength and postural control for improved transfers/standing endurance.    Personal Factors and Comorbidities Age;Comorbidity 3+    Comorbidities Relevant past medical history and comorbidities include long term steroid use  for lupus, CKD, chronic osteomyelitis, cervical spine stenosis, BPH, APS, alcohol abuse, peripheral artery disease, depression, GERD, obstructive sleep apnea, HTN, IBS, IgA deficiency, lumbar radiculopathy, RA, Stroke, toxic maculopathy in both eyes, systemic lupus erythematosus related syndrome, cardiac catheterization, carotid endarterectomy, cervical laminectomy, coronary angioplasty, Fusion C5-C7, knee arthroplasty, lumbar surgery.    Examination-Activity Limitations Bed Mobility;Bend;Sit;Toileting;Stand;Stairs;Lift;Transfers;Squat;Locomotion Level;Carry;Dressing;Hygiene/Grooming;Continence    Examination-Participation Restrictions Yard Work;Interpersonal Relationship;Community Activity    Stability/Clinical Decision Making Evolving/Moderate complexity    Rehab Potential Fair    PT Frequency 3x / week    PT Duration 12 weeks    PT Treatment/Interventions ADLs/Self Care Home Management;Electrical Stimulation;Moist Heat;Cryotherapy;Gait training;Stair training;Functional mobility training;Therapeutic exercise;Balance training;Neuromuscular re-education;Cognitive remediation;Patient/family education;Orthotic Fit/Training;Wheelchair mobility training;Manual techniques;Passive range of motion;Energy conservation;Joint Manipulations;Canalith Repostioning;Therapeutic activities;Vestibular    PT Next Visit Plan Continue with progressive postural/LE strengthening.    PT Home Exercise Plan no changes    Consulted and Agree with Plan of Care Patient;Family member/caregiver    Family Member Consulted Caregiver- [place]           Patient will benefit from skilled therapeutic intervention in order to improve the following deficits and impairments:  Abnormal gait,Decreased activity tolerance,Decreased cognition,Decreased endurance,Decreased knowledge of use of DME,Decreased range of motion,Decreased skin integrity,Decreased strength,Impaired perceived functional ability,Impaired sensation,Impaired UE  functional use,Improper body mechanics,Pain,Cardiopulmonary status limiting activity,Decreased balance,Decreased coordination,Decreased mobility,Difficulty walking,Impaired tone,Postural dysfunction,Dizziness  Visit Diagnosis: Muscle weakness (generalized)  Other lack of coordination  Hemiplegia and hemiparesis following cerebral infarction affecting left non-dominant side ([place])  Difficulty in walking, not elsewhere classified     Problem List Patient Active Problem List   Diagnosis Date Noted  . Sepsis ([place]) 10/18/2018    Lewis Moccasin, PT 04/05/2020, 7:48 PM  [place] MAIN [facility] SERVICES [address] [place], [place], [address] Phone: [phone]   Fax:  [phone]  Name: SKYY MCKNIGHT MRN: [id number] Date of Birth: 08/11/1945

## 2020-04-07 ENCOUNTER — Ambulatory Visit: Payer: Medicare Other | Attending: Family Medicine

## 2020-04-07 ENCOUNTER — Other Ambulatory Visit: Payer: Self-pay

## 2020-04-07 DIAGNOSIS — M6281 Muscle weakness (generalized): Secondary | ICD-10-CM | POA: Diagnosis present

## 2020-04-07 DIAGNOSIS — I69354 Hemiplegia and hemiparesis following cerebral infarction affecting left non-dominant side: Secondary | ICD-10-CM | POA: Diagnosis present

## 2020-04-07 DIAGNOSIS — H547 Unspecified visual loss: Secondary | ICD-10-CM | POA: Insufficient documentation

## 2020-04-07 DIAGNOSIS — R296 Repeated falls: Secondary | ICD-10-CM

## 2020-04-07 DIAGNOSIS — R41841 Cognitive communication deficit: Secondary | ICD-10-CM | POA: Diagnosis present

## 2020-04-07 DIAGNOSIS — R262 Difficulty in walking, not elsewhere classified: Secondary | ICD-10-CM | POA: Insufficient documentation

## 2020-04-07 DIAGNOSIS — R278 Other lack of coordination: Secondary | ICD-10-CM | POA: Diagnosis present

## 2020-04-07 NOTE — Therapy (Signed)
[place] MAIN [facility] SERVICES [address] [place], [place], [address] Phone: [phone]   Fax:  [phone]  Physical Therapy Treatment  Patient Details  Name: Kenneth Summers MRN: [id number] Date of Birth: 07-Mar-1945 No data recorded  Encounter Date: 04/07/2020   PT End of Session - 04/07/20 1258    Visit Number [id number]    Number of Visits 252    Date for PT Re-Evaluation 04/19/20    Authorization Type Medicare reporting period starting 09/11/18; NO FOTO    Authorization Time Period goals last assessed on 03/01/2020    PT Start Time 1259    PT Stop Time 1340    PT Time Calculation (min) 41 min    Equipment Utilized During Treatment Gait belt    Activity Tolerance No increased pain;Patient limited by fatigue;Patient tolerated treatment well    Behavior During Therapy [facility] for tasks assessed/performed           Past Medical History:  Diagnosis Date  . Actinic keratosis   . Alcohol abuse   . APS (antiphospholipid syndrome) ([place])   . Basal cell carcinoma   . BPH (benign prostatic hyperplasia)   . CAD (coronary artery disease)   . Cellulitis   . Cervical spinal stenosis   . Chronic osteomyelitis ([place])   . CKD (chronic kidney disease)   . CKD (chronic kidney disease)   . Clostridium difficile diarrhea   . Depression   . Diplopia   . Fatigue   . GERD (gastroesophageal reflux disease)   . Hyperlipemia   . Hypertension   . Hypovitaminosis D   . IBS (irritable bowel syndrome)   . 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 Insomnia   . Left lumbosacral radiculopathy   . Moderate obstructive sleep apnea   . Osteomyelitis of foot ([place])   . PAD (peripheral artery disease) ([place])   . Pruritus   . RA (rheumatoid arthritis) ([place])   . Radiculopathy   . Restless legs syndrome   . RLS (restless legs syndrome)   . SLE (systemic lupus erythematosus related syndrome) ([place])   . 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 Stroke  ([place])   . Toxic maculopathy of both eyes     Past Surgical History:  Procedure Laterality Date  . CARDIAC CATHETERIZATION    . CAROTID ENDARTERECTOMY    . CERVICAL LAMINECTOMY    . CORONARY ANGIOPLASTY    . FRACTURE SURGERY    . fusion C5-6-7    . HERNIA REPAIR    . KNEE ARTHROSCOPY    . TONSILLECTOMY      There were no vitals filed for this visit.   Subjective Assessment - 04/07/20 1257    Subjective Patient reports feeling okay and caregiver reports he has been more engaged in assisting with ADLs- pulling up with bed mobility, rolling and sitting upright in power wheelchair.    Patient is accompained by: Family member    Pertinent History Patient is a 75 y.o. male who presents to outpatient physical therapy with a referral for medical diagnosis of CVA. This patient's chief complaints consist of left hemiplegia and overall deconditioning leading to the following functional deficits: dependent for ADLs, IADLs, unable to transfer to car, dependent transfers at home with hoyer lift, unable to walk or stand without significant assistance, difficulty with W/C navigation..    Limitations Sitting;Lifting;Standing;House hold activities;Writing;Walking;Other (comment)    Diagnostic tests Brain MRI 11/01/2017: IMPRESSION: 1.  Acute right ACA territory infarct as demonstrated on prior CT imaging. No intracranial hemorrhage or significant mass effect. 2. 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    Patient Stated Goals wants to be able to walk again    Currently in Pain? No/denies    Pain Onset Today    Pain Onset Today               Treatment:  Seated Kee ext- kicking a red cone strategically placed in various spots on floor and instructed patient to "kick the cone" - Patient with no difficulty kicking with right LE and was able to perform and improve with kicking the cone using left LE with VCs to focus as patient remains easily distracted. He  experienced difficulty with medial kicks due to poor adductor/med quad strength. Total time of activity= 6min  With approx 20-25 kicks each leg. Patient limited due to fatigue.   Seated hip march AROM BLE x 10 reps  with minimal Left LE movement- Patient able to engage with more active left hip flex today.   Dynamic trunk rotation - Instructed patient to  Cross his arms and turn to left then right  X 10 reps each direction. Patient reported increased soreness overall after completing.   Sit to stand and progressive static standing activity:  Max assist to stand from 27 in heigh of power chair- using BUE on parallel bars to pull up. Patient required increased assist to stand (approx pt effort 20%) with initial 3 stands yet did improve on last trial to more 30% patient effort.   4 trials of static standing:  1) 67 sec 2) 87 sec 3) 62 sec 4) 47 sec Patient initially exhibited max difficulty standing and unable to extend hips or knees  Without max assist and 1 PT blocking his left knee and the other assisting to extend his trunk. He did better on 3rd and 4th trial with improved hip ext although only able to hold briefly approx 10 sec  Patient stopped due to fatigue.                               PT Education - 04/07/20 1450    Education Details Postural education with transfers and static standing.    Person(s) Educated Patient    Methods Explanation;Demonstration;Tactile cues;Verbal cues    Comprehension Verbalized understanding;Returned demonstration;Verbal cues required;Tactile cues required;Need further instruction            PT Short Term Goals - 01/26/20 1308      PT SHORT TERM GOAL #1   Title Be independent with initial home exercise program for self-management of symptoms.    Baseline HEP to be given at second session; advice to practice unsupported sitting at home (06/19/2018); 07/08/18: Performing at home, 6/29: needs assistance but is doing exercise  program regularly; 08/22/18: adherent;    Time 2    Period Weeks    Status Achieved    Target Date 06/26/18             PT Long Term Goals - 03/31/20 1343      PT LONG TERM GOAL #1   Title Patient will complete all bed mobility with min A to improve functional independence for getting in and out of bed and adjusting in bed.     Baseline max A - total A reported by family (06/19/2018); 07/08/18: still requires maxA, dtr  reports improvement in rolling since starting therapy; 6/23: min A to supervision in clinic; not doing at home right; 8/5: Pt's daughter states that his rolling has improved, but still has difficulty with all other bed mobility; 10/23/18: pts daughter states that she is doing about 35% of the work if he is in proper position, he does uses the bed rails to help roll;  using Harrel Lemon for coming to sit EOB; 10/12: pts daughter states that she is doing about 30% of the work if he is in proper position, he does uses the bed rails to help roll, but it is improving;  using Harrel Lemon for coming to sit EOB, 10/29: min A for sit to sidelying, rolling supervision, able to transition sidelying to sit with HHA; 12/21: min-mod A. 03/01/2020- Patient presents improving bed mobility with current ability to roll to left with CGA and verbal and tactile cues and min assist to roll to right as seen by recent visits. 03/31/2020- Patient and caregiver report that he has maintained ability to roll side to side with only minimal assistance.    Time 12    Period Weeks    Status Achieved    Target Date 04/26/20      PT LONG TERM GOAL #2   Title Patient will complete sit <> stand transfer chair to chair with Min A and LRAD to improve functional independence for household and community mobility.     Baseline min A for STS, still requires elevated surface height to 27".  03/01/20- Patient currently unable to perform a chair to chair transfer and continues to be transferred using hoyer lift. Patient has been training  with sit to stand at parallel bars and able to currently perform sit to stand with max assist  from 27in. high surface.03/31/2020- Patient remains at [facility] assist with sit to stand transfer and continued use of hoyer lift transfer for all chair to chair or bed to chair transfers.    Time 12    Period Weeks    Status On-going    Target Date 04/26/20      PT LONG TERM GOAL #3   Title Patient will navigate power w/c with min A x 100 feet to improve mobility for household and short community distances.    Baseline required total A (06/12/2018); able to roll 20 feet with min A (06/20/2018); 07/08/18: Pt can go approximately 10' without assist per family;  10/23/2018: pt's daughter states that his power WC mobility has become 100% better with the new hand control, slowed speed, and with practice; 10/12: pt's daughter reports that he can perform household WC mobility and limited community ambulation with minA    Time 12    Period Weeks    Status Achieved    Target Date 04/26/20      PT LONG TERM GOAL #4   Title Patient will complete car transfers W/C to family SUV with min A using LRAD to improve his abilty to participate in community activities.     Baseline unable to complete car transfers at all (06/12/2018); unable to complete car transfers at all (06/19/2018).; 07/08/18: Unable to perform at this time; 09/11/18: unable to perform at this time; 10/23/2018: unable to complete car transfers at all; 10/12:  unable to complete car transfers at all, 10/29: unable; 01/13/19: unable; 2/11: min-mod A +2, 3/10: min A+1, 12/21: mod A. 03/01/2020- Patient unable to complete car transfers and continues to require medical transportation at this time.    Time 12  Period Weeks      PT LONG TERM GOAL #5   Title Patient will ambulate at least 10 feet with min A using LRAD to improve mobility for household and community distances.    Baseline unable to take steps (06/12/2018); able to shift R leg forward and back with max A and RW  at edge of plinth (06/19/2018); 07/08/18: unable to ambulate at this time. 09/11/18: unable to perform at this time; 10/23/2018: unable to perform at this time; 10/12: unable to perform at this time, 10/29: unable at this time; 01/13/19: unable at this time, 2/11: min A to step with RLE, unable to step with LLE, 3/10: able to take 2 steps in parallel bars with min A for safety; 4/1: min A for walking 10 feet in parallel bars, 5/24: min A walking 10 feet in parallel bars; 6/21: no change 7/14: no change. 03/01/2020- Patient unable to ambulate and currently focusing on static standing. He was able to take a forward step with his right LE last week but unable today. 03/31/2020- Patient continues to be non-ambulatory but has been able to move right LE forward with standing but unable to take a step with left LE.    Time 12    Period Weeks    Status Deferred    Target Date 04/26/20      PT LONG TERM GOAL #6   Title Patient will be able to safety navigate up/down ramp with power chair, exhibiting good safety awareness, mod I to safely enter/exit his home.    Baseline 2/11: supervision with intermittent min A;4/1: supervision; 5/24: supervision. 03/01/2020- patient reports that he is able to navigate down the ramp at home to leave the home but continues to need assist to navigate up the ramp into the home.    Time 12    Period Weeks    Status Partially Met    Target Date 04/26/20      PT LONG TERM GOAL #7   Title Patient will increase functional reach test to >15 inches in sitting to exhibit improved sitting balance and positioning and reduce fall risk;    Baseline 07/30/18: 12 inches; 09/11/18: 10-12 inches; 10/23/18: 12-13 inch; 10/12: 14.5", 10/29: 15.5 inch    Time 4    Period Weeks    Status Achieved      PT LONG TERM GOAL #8   Title Patient will increase LLE quad strength to 3+/5 to improve functional strength for standing and mobility;    Baseline 10/28: 2+/5; 01/13/19: 2+/5, 2/11: 3-/5, 04/16/19: 3-/5,  05/08/19: 3-/5, 5/24: 3-/5, 6/21: 3-/5, 7/14: 3-/5, 12/21: 3-/5. 03/01/2020- Left quad= 3-/5 (focusing on quad strengthening during visits and with home program). 03/31/2020- left quad = 3-/5 with MMT.    Time 12    Period Weeks    Status On-going      PT LONG TERM GOAL  #9   TITLE Patient will report no vertigo with provoking motions or positions in order to be able to perform ADLs and mobility tasks without symptoms.    Baseline 06/30/19: reports no dizziness in last week; 6/21: one episode this weekend; 7/14: experiencing one a week; 03/01/2020.- Patient has denied any dizziness for over past 3 weeks.    Time 12    Period Weeks    Status Achieved      PT LONG TERM GOAL  #10   TITLE Patient will demonstrate improved functional LE/Postural strength as seen by ability to perform static standing for 2  min or greater with max assist  to assist with strength required for ADL's and transfers/pregait/gait activities.    Baseline 03/01/2020- Patient able to stand approx 40 sec today with max assist in parallel bars. 03/31/2020- Patient able to demo static stand with max A for 56min 47 sec today.    Time 8    Period Weeks    Target Date 04/26/20                 Plan - 04/07/20 1258    Clinical Impression Statement Patient experienced increased difficulty contracting gluteals during visit today.  He required more assistance during 1st few stands today but did improve overall with practice. He was able to continue with his progress with improved ability to weight shift forward in sitting and leaning forward with SBA to min A today. He will continue to benefit from skilled PT services to improve his functional strength and postural control for improved transfers/standing endurance.    Personal Factors and Comorbidities Age;Comorbidity 3+    Comorbidities Relevant past medical history and comorbidities include long term steroid use for lupus, CKD, chronic osteomyelitis, cervical spine stenosis, BPH,  APS, alcohol abuse, peripheral artery disease, depression, GERD, obstructive sleep apnea, HTN, IBS, IgA deficiency, lumbar radiculopathy, RA, Stroke, toxic maculopathy in both eyes, systemic lupus erythematosus related syndrome, cardiac catheterization, carotid endarterectomy, cervical laminectomy, coronary angioplasty, Fusion C5-C7, knee arthroplasty, lumbar surgery.    Examination-Activity Limitations Bed Mobility;Bend;Sit;Toileting;Stand;Stairs;Lift;Transfers;Squat;Locomotion Level;Carry;Dressing;Hygiene/Grooming;Continence    Examination-Participation Restrictions Yard Work;Interpersonal Relationship;Community Activity    Stability/Clinical Decision Making Evolving/Moderate complexity    Rehab Potential Fair    PT Frequency 3x / week    PT Duration 12 weeks    PT Treatment/Interventions ADLs/Self Care Home Management;Electrical Stimulation;Moist Heat;Cryotherapy;Gait training;Stair training;Functional mobility training;Therapeutic exercise;Balance training;Neuromuscular re-education;Cognitive remediation;Patient/family education;Orthotic Fit/Training;Wheelchair mobility training;Manual techniques;Passive range of motion;Energy conservation;Joint Manipulations;Canalith Repostioning;Therapeutic activities;Vestibular    PT Next Visit Plan Continue with progressive postural/LE strengthening.    PT Home Exercise Plan no changes    Consulted and Agree with Plan of Care Patient;Family member/caregiver    Family Member Consulted Caregiver- [place]           Patient will benefit from skilled therapeutic intervention in order to improve the following deficits and impairments:  Abnormal gait,Decreased activity tolerance,Decreased cognition,Decreased endurance,Decreased knowledge of use of DME,Decreased range of motion,Decreased skin integrity,Decreased strength,Impaired perceived functional ability,Impaired sensation,Impaired UE functional use,Improper body mechanics,Pain,Cardiopulmonary status limiting  activity,Decreased balance,Decreased coordination,Decreased mobility,Difficulty walking,Impaired tone,Postural dysfunction,Dizziness  Visit Diagnosis: Hemiplegia and hemiparesis following cerebral infarction affecting left non-dominant side ([place])  Muscle weakness (generalized)  Difficulty in walking, not elsewhere classified  Other lack of coordination  Repeated falls     Problem List Patient Active Problem List   Diagnosis Date Noted  . Sepsis ([place]) 10/18/2018    Lewis Moccasin, PT 04/07/2020, 5:03 PM  [place] MAIN [facility] SERVICES [address] [place], [place], [address] Phone: [phone]   Fax:  [phone]  Name: KAIRO LAUBACHER MRN: [id number] Date of Birth: 1945/04/30

## 2020-04-08 ENCOUNTER — Other Ambulatory Visit: Payer: Self-pay

## 2020-04-08 ENCOUNTER — Ambulatory Visit: Payer: Medicare Other | Admitting: Occupational Therapy

## 2020-04-08 ENCOUNTER — Encounter: Payer: Self-pay | Admitting: Occupational Therapy

## 2020-04-08 ENCOUNTER — Ambulatory Visit: Payer: Medicare Other

## 2020-04-08 DIAGNOSIS — I69354 Hemiplegia and hemiparesis following cerebral infarction affecting left non-dominant side: Secondary | ICD-10-CM | POA: Diagnosis not present

## 2020-04-08 DIAGNOSIS — R278 Other lack of coordination: Secondary | ICD-10-CM

## 2020-04-08 DIAGNOSIS — M6281 Muscle weakness (generalized): Secondary | ICD-10-CM

## 2020-04-08 DIAGNOSIS — R262 Difficulty in walking, not elsewhere classified: Secondary | ICD-10-CM

## 2020-04-08 NOTE — Therapy (Signed)
[place] [facility] SERVICES [address] [place], [place], [address] Phone: [phone]   Fax:  [phone]  Occupational Therapy Treatment  Patient Details  Name: Kenneth Summers MRN: [id number] Date of Birth: 1945-08-06 No data recorded  Encounter Date: 04/08/2020   OT End of Session - 04/08/20 1308    Visit Number [id number]    Number of Visits 205    Date for OT Re-Evaluation 05/24/20    Authorization Type Progress report period starting 03/11/2020    OT Start Time 1300    OT Stop Time 1345    OT Time Calculation (min) 45 min    Activity Tolerance Patient tolerated treatment well    Behavior During Therapy [facility] for tasks assessed/performed           Past Medical History:  Diagnosis Date  . Actinic keratosis   . Alcohol abuse   . APS (antiphospholipid syndrome) ([place])   . Basal cell carcinoma   . BPH (benign prostatic hyperplasia)   . CAD (coronary artery disease)   . Cellulitis   . Cervical spinal stenosis   . Chronic osteomyelitis ([place])   . CKD (chronic kidney disease)   . CKD (chronic kidney disease)   . Clostridium difficile diarrhea   . Depression   . Diplopia   . Fatigue   . GERD (gastroesophageal reflux disease)   . Hyperlipemia   . Hypertension   . Hypovitaminosis D   . IBS (irritable bowel syndrome)   . 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 Insomnia   . Left lumbosacral radiculopathy   . Moderate obstructive sleep apnea   . Osteomyelitis of foot ([place])   . PAD (peripheral artery disease) ([place])   . Pruritus   . RA (rheumatoid arthritis) ([place])   . Radiculopathy   . Restless legs syndrome   . RLS (restless legs syndrome)   . SLE (systemic lupus erythematosus related syndrome) ([place])   . 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 Stroke ([place])   . Toxic maculopathy of both eyes     Past Surgical History:  Procedure Laterality Date  . CARDIAC CATHETERIZATION    . CAROTID ENDARTERECTOMY    .  CERVICAL LAMINECTOMY    . CORONARY ANGIOPLASTY    . FRACTURE SURGERY    . fusion C5-6-7    . HERNIA REPAIR    . KNEE ARTHROSCOPY    . TONSILLECTOMY      There were no vitals filed for this visit.   Subjective Assessment - 04/08/20 1307    Subjective  Pt. reports that he feels good, but tired today    Patient is accompanied by: Family member    Pertinent History Pt. is a 75 y.o. 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 Pt. PMHx includes: Multiple Falls, Lupus, DJD, Renal Abscess, CVA. Pt. resides with his wife. Pt.'s wife and daughter assist with ADLs. Pt. has caregivers in  for 2 hours a day, 6 days a week. Pt. received Rehab services in acute care, at SNF for STR, and [place] services. Pt. is retired from [employer] for [employer] and [employer].    Currently in Pain? No/denies          OT TREATMENT   Therapeutic Exercise:  Pt. worked on [place] for reciprocal motion. Pt. performed2#dowelfor UE strengthening secondary to weakness.Pt.tolerated increased dowel weight.Bilateral shoulder flexion, chest press,andcircular  patterns were performedfor1-2 sets10reps each.Pt. worked on3# dumbbell ex. forbilateralelbow flexion and extension, 4# for rightand3#left.Pt. performedforearm supination/pronation with 4# for the RUE, and 2# with the left for1 set of10-48for wrist extension, and radial deviation 1 set 20 reps.Tactile and verbal cues were required for eliciting the desired movement, and correct hand position.  Pt.continuestomakesteadyprogress with BUE strength, and tolerance for there.ex.Pt. required less cues today.Pt.continues to require consistent verbal, visual, and tactile cues.Pt. did require cues for radial deviation. Pt. required a pillow for lateral support at the left to assume midline position.Pt.continues torequire less cuesto perform UE strengthening exercises, technique, and  formas well as visual demonstration prior to the exercises.Pt. continues to work on improving BUE strength, and [facility] skills in order to improve LUE functioning during ADLs, IADLs, and tranfers.                        OT Education - 04/08/20 1308    Education Details strengthening, UE functioning    Person(s) Educated Patient;Spouse    Methods Explanation;Demonstration    Comprehension Returned demonstration;Tactile cues required;Verbalized understanding;Need further instruction               OT Long Term Goals - 03/11/20 1315      OT LONG TERM GOAL #1   Title Pt. will increase left shoulder flexion AROM by 10 degrees to access cabinet/shelf.    Baseline Pt. conitnues to present with limited left shoulder ROM limitng access to cabinets, and shelves.03/01/2020: Left shoulder 116 degrees with new onset of numbness radiating from the left side of the neck to the hand. Pt. continues to progress with left shoulder ROM .Shouler flexion 130, and continues to work on reaching with increasing emphasis on flexing trunk to improve functional reach.  07/21/19- shoulder flexion to 130 with effort    Time 12    Period Weeks    Status On-going    Target Date 05/24/20      OT LONG TERM GOAL #2   Title Pt. will donn a shirt with Supervision.    Baseline Pt. continues to require modA donning a zip down jacket with consistent cues to initiate the correct direction of the jacket.  Min-modA mahaging the zipper. MinA for donning a T-shirt with assist to roll side to side to pull the shirt down over his back. 07/21/19 continues to require min assist at times for shirt/jacket    Time 12    Period Weeks    Status On-going    Target Date 05/24/20      OT LONG TERM GOAL #4   Title Pt. will improve left grip strength by 10# to be able to open a jar/container.    Baseline Grip strength conitnues to be limited. Decreased left sided grip strength today secondary to a new onset of numbenss,  and weakness radiating from the neck to the left hand. Pt. continues to have a harder time opening a wide mouthed jar, however is able to open a smaller jar easier.    Time 12    Period Weeks    Status On-going    Target Date 05/24/20      OT LONG TERM GOAL #5   Title Pt. will improve left hand [facility] skills to be able to assist with buttoning/zipping.    Baseline Pt. conitnues to work on improving [facility] skills  to improve buttoning , and zipping.03/01/2020: Pt.has had a new onset of weakness, and numbness in the left hand beginning  upon waking up this morning which has resulted in a significant change in [facility] skills.  Pt. has improved overall donning a jacket, and  manipulating the zipper on his jacket, however continues to require practice depending on the type of zipper jacket. Pt. continues to work on improving bilateral [facility] skills for buttoning/zipping.  07/21/19 improving with buttons and zippers.    Time 12    Period Weeks    Status On-going    Target Date 05/24/20      OT LONG TERM GOAL #6   Title Pt. will demonstrate visual conmpensatory strategies for 100% of the time during ADLs    Baseline Improving, however Pt. continues to require intermittent cues cues for left sides awareness.    Time 12    Period Weeks    Status On-going      OT LONG TERM GOAL #8   Title Pt. will accurately identify potential safety hazard using good safety awareness, and judgement 100% for ADLs, and IADLs.    Baseline Pt. continues to require cues    Time 12    Period Weeks    Status On-going    Target Date 05/24/20      OT LONG TERM GOAL  #9   TITLE Pt. will  navigate his w/c around obstacles with Supervision and 100% accuracy    Baseline Pt. continues to require cuing for obstacles on the left, however requires less cues overall. Pt. requires significant cues to engage his left UE during tasks. 07/21/2019 continues to improve with decreased assist at times    Time 12    Period Weeks    Status On-going     Target Date 05/24/20      OT LONG TERM GOAL  #10   TITLE Pt. will independently be able  to reach to place items into cabinetry, and closets.    Baseline Pt. continues to have difficulty with functional reaching conitnues to be limited.    Time 12    Period Weeks    Status On-going    Target Date 05/24/20      OT LONG TERM GOAL  #11   TITLE Pt. will increase BUE strength by 26mm grades in preparation for ADL transfers.    Baseline Pt. BUE strength conitnues to be limited. Pt. is able to tolerate increased resistive weights. Pt. continues to work towards improving overall strength for ADLs.    Time 12    Period Weeks    Status On-going    Target Date 05/24/20      OT LONG TERM GOAL  #12   TITLE Pt. will improve LUE functional reaching with the left engaging the trunk 100% of the time during ADL tasks with minimal cues.    Baseline Left sided numbness has improved. New onset of left sided numbness, and weakness is affecting his ability to perform reaching.    Time 12    Period Weeks    Status On-going    Target Date 05/24/20                 Plan - 04/08/20 1309    Clinical Impression Statement Pt.continuestomakesteadyprogress with BUE strength, and tolerance for there.ex.Pt. required less cues today.Pt.continues to require consistent verbal, visual, and tactile cues.Pt. did require cues for radial deviation. Pt. required a pillow for lateral support at the left to assume midline position.Pt.continues torequire less cuesto perform UE strengthening exercises, technique, and formas well as visual demonstration prior to the exercises.Pt. continues to work  on improving BUE strength, and [facility] skills in order to improve LUE functioning during ADLs, IADLs, and tranfers.   OT Occupational Profile and History Problem Focused Assessment - Including review of records relating to presenting problem    Occupational performance deficits (Please refer to evaluation for details):  ADL's    Body Structure / Function / Physical Skills UE functional use;Coordination;FMC;Dexterity;Strength;ROM    Cognitive Skills Attention;Memory;Emotional;Problem Solve;Safety Awareness    Rehab Potential Good    Clinical Decision Making Several treatment options, min-mod task modification necessary    Comorbidities Affecting Occupational Performance: Presence of comorbidities impacting occupational performance    Comorbidities impacting occupational performance description: Phyical, cognitive, visual,  medical comorbidities    Modification or Assistance to Complete Evaluation  No modification of tasks or assist necessary to complete eval    OT Frequency 2x / week    OT Duration 12 weeks    OT Treatment/Interventions Self-care/ADL training;DME and/or AE instruction;Therapeutic exercise;Therapeutic activities;Moist Heat;Cognitive remediation/compensation;Neuromuscular education;Visual/perceptual remediation/compensation;Coping strategies training;Patient/family education;Passive range of motion;Psychosocial skills training;Energy conservation;Functional Mobility Training    Consulted and Agree with Plan of Care Patient;Family member/caregiver           Patient will benefit from skilled therapeutic intervention in order to improve the following deficits and impairments:   Body Structure / Function / Physical Skills: UE functional use,Coordination,FMC,Dexterity,Strength,ROM Cognitive Skills: Attention,Memory,Emotional,Problem Solve,Safety Awareness     Visit Diagnosis: Muscle weakness (generalized)  Other lack of coordination    Problem List Patient Active Problem List   Diagnosis Date Noted  . Sepsis ([place]) 10/18/2018    Harrel Carina, MS, OTR/L 04/08/2020, 1:12 PM  [place] [facility] SERVICES [address] [place], [place], [address] Phone: [phone]   Fax:  [phone]  Name: Kenneth Summers MRN: [id number] Date of Birth:  [birth year]

## 2020-04-08 NOTE — Therapy (Signed)
[place] MAIN [facility] SERVICES [address] [place], [place], [address] Phone: [phone]   Fax:  [phone]  Physical Therapy Treatment  Patient Details  Name: Kenneth Summers MRN: [id number] Date of Birth: 08/17/1945 No data recorded  Encounter Date: 04/08/2020   PT End of Session - 04/08/20 1341    Visit Number [id number]    Number of Visits 252    Date for PT Re-Evaluation 04/19/20    Authorization Type Medicare reporting period starting 09/11/18; NO FOTO    Authorization Time Period goals last assessed on 03/01/2020    PT Start Time 1344    PT Stop Time 1410    PT Time Calculation (min) 26 min    Equipment Utilized During Treatment Gait belt    Activity Tolerance No increased pain;Patient limited by fatigue;Patient tolerated treatment well    Behavior During Therapy [facility] for tasks assessed/performed           Past Medical History:  Diagnosis Date  . Actinic keratosis   . Alcohol abuse   . APS (antiphospholipid syndrome) ([place])   . Basal cell carcinoma   . BPH (benign prostatic hyperplasia)   . CAD (coronary artery disease)   . Cellulitis   . Cervical spinal stenosis   . Chronic osteomyelitis ([place])   . CKD (chronic kidney disease)   . CKD (chronic kidney disease)   . Clostridium difficile diarrhea   . Depression   . Diplopia   . Fatigue   . GERD (gastroesophageal reflux disease)   . Hyperlipemia   . Hypertension   . Hypovitaminosis D   . IBS (irritable bowel syndrome)   . 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 Insomnia   . Left lumbosacral radiculopathy   . Moderate obstructive sleep apnea   . Osteomyelitis of foot ([place])   . PAD (peripheral artery disease) ([place])   . Pruritus   . RA (rheumatoid arthritis) ([place])   . Radiculopathy   . Restless legs syndrome   . RLS (restless legs syndrome)   . SLE (systemic lupus erythematosus related syndrome) ([place])   . 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 Stroke  ([place])   . Toxic maculopathy of both eyes     Past Surgical History:  Procedure Laterality Date  . CARDIAC CATHETERIZATION    . CAROTID ENDARTERECTOMY    . CERVICAL LAMINECTOMY    . CORONARY ANGIOPLASTY    . FRACTURE SURGERY    . fusion C5-6-7    . HERNIA REPAIR    . KNEE ARTHROSCOPY    . TONSILLECTOMY      There were no vitals filed for this visit.   Subjective Assessment - 04/08/20 1338    Subjective Patient reports feeling tired today. Daughter reports he said he was tired earlier as well.    Patient is accompained by: Family member    Pertinent History Patient is a 75 y.o. male who presents to outpatient physical therapy with a referral for medical diagnosis of CVA. This patient's chief complaints consist of left hemiplegia and overall deconditioning leading to the following functional deficits: dependent for ADLs, IADLs, unable to transfer to car, dependent transfers at home with hoyer lift, unable to walk or stand without significant assistance, difficulty with W/C navigation..    Limitations Sitting;Lifting;Standing;House hold activities;Writing;Walking;Other (comment)    Diagnostic tests Brain MRI 11/01/2017: IMPRESSION: 1. Acute right ACA territory infarct as demonstrated on prior CT imaging. No intracranial  hemorrhage or significant mass effect. 2. 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    Patient Stated Goals wants to be able to walk again    Currently in Pain? No/denies    Pain Onset Today    Pain Onset Today              Patient performed static standing x 2 trials today: 1) Max  Assist  To stand- Patient slumped over with poor participation and unable to stand erect today- leaning to left- only able to stand approx 22 sec 2) Max assist with static standing- Patient with poor ability to stand requiring almost dependence in standing. Stopped secondary to poor ability  And increased risk of falling.   Attempted seated therex-  patient too tired to participate except for performing 15 reps with seated hip march on right LE and poor ability to perform with left LE.  Attempted seated postural exercises yet patient unable to maintain upright posture depsite max cues.   Asked patient if he felt he could continue and he requested to stop due to undo fatigue.                        PT Education - 04/09/20 0924    Education Details exercise and postural cues.    Person(s) Educated Patient    Methods Explanation;Demonstration;Tactile cues;Verbal cues    Comprehension Verbalized understanding;Returned demonstration;Verbal cues required;Need further instruction            PT Short Term Goals - 01/26/20 1308      PT SHORT TERM GOAL #1   Title Be independent with initial home exercise program for self-management of symptoms.    Baseline HEP to be given at second session; advice to practice unsupported sitting at home (06/19/2018); 07/08/18: Performing at home, 6/29: needs assistance but is doing exercise program regularly; 08/22/18: adherent;    Time 2    Period Weeks    Status Achieved    Target Date 06/26/18             PT Long Term Goals - 03/31/20 1343      PT LONG TERM GOAL #1   Title Patient will complete all bed mobility with min A to improve functional independence for getting in and out of bed and adjusting in bed.     Baseline max A - total A reported by family (06/19/2018); 07/08/18: still requires maxA, dtr reports improvement in rolling since starting therapy; 6/23: min A to supervision in clinic; not doing at home right; 8/5: Pt's daughter states that his rolling has improved, but still has difficulty with all other bed mobility; 10/23/18: pts daughter states that she is doing about 35% of the work if he is in proper position, he does uses the bed rails to help roll;  using Harrel Lemon for coming to sit EOB; 10/12: pts daughter states that she is doing about 30% of the work if he is in proper  position, he does uses the bed rails to help roll, but it is improving;  using Harrel Lemon for coming to sit EOB, 10/29: min A for sit to sidelying, rolling supervision, able to transition sidelying to sit with HHA; 12/21: min-mod A. 03/01/2020- Patient presents improving bed mobility with current ability to roll to left with CGA and verbal and tactile cues and min assist to roll to right as seen by recent visits. 03/31/2020- Patient and caregiver report that he has maintained  ability to roll side to side with only minimal assistance.    Time 12    Period Weeks    Status Achieved    Target Date 04/26/20      PT LONG TERM GOAL #2   Title Patient will complete sit <> stand transfer chair to chair with Min A and LRAD to improve functional independence for household and community mobility.     Baseline min A for STS, still requires elevated surface height to 27".  03/01/20- Patient currently unable to perform a chair to chair transfer and continues to be transferred using hoyer lift. Patient has been training with sit to stand at parallel bars and able to currently perform sit to stand with max assist  from 27in. high surface.03/31/2020- Patient remains at [facility] assist with sit to stand transfer and continued use of hoyer lift transfer for all chair to chair or bed to chair transfers.    Time 12    Period Weeks    Status On-going    Target Date 04/26/20      PT LONG TERM GOAL #3   Title Patient will navigate power w/c with min A x 100 feet to improve mobility for household and short community distances.    Baseline required total A (06/12/2018); able to roll 20 feet with min A (06/20/2018); 07/08/18: Pt can go approximately 10' without assist per family;  10/23/2018: pt's daughter states that his power WC mobility has become 100% better with the new hand control, slowed speed, and with practice; 10/12: pt's daughter reports that he can perform household WC mobility and limited community ambulation with minA    Time 12     Period Weeks    Status Achieved    Target Date 04/26/20      PT LONG TERM GOAL #4   Title Patient will complete car transfers W/C to family SUV with min A using LRAD to improve his abilty to participate in community activities.     Baseline unable to complete car transfers at all (06/12/2018); unable to complete car transfers at all (06/19/2018).; 07/08/18: Unable to perform at this time; 09/11/18: unable to perform at this time; 10/23/2018: unable to complete car transfers at all; 10/12:  unable to complete car transfers at all, 10/29: unable; 01/13/19: unable; 2/11: min-mod A +2, 3/10: min A+1, 12/21: mod A. 03/01/2020- Patient unable to complete car transfers and continues to require medical transportation at this time.    Time 12    Period Weeks      PT LONG TERM GOAL #5   Title Patient will ambulate at least 10 feet with min A using LRAD to improve mobility for household and community distances.    Baseline unable to take steps (06/12/2018); able to shift R leg forward and back with max A and RW at edge of plinth (06/19/2018); 07/08/18: unable to ambulate at this time. 09/11/18: unable to perform at this time; 10/23/2018: unable to perform at this time; 10/12: unable to perform at this time, 10/29: unable at this time; 01/13/19: unable at this time, 2/11: min A to step with RLE, unable to step with LLE, 3/10: able to take 2 steps in parallel bars with min A for safety; 4/1: min A for walking 10 feet in parallel bars, 5/24: min A walking 10 feet in parallel bars; 6/21: no change 7/14: no change. 03/01/2020- Patient unable to ambulate and currently focusing on static standing. He was able to take a forward step with his  right LE last week but unable today. 03/31/2020- Patient continues to be non-ambulatory but has been able to move right LE forward with standing but unable to take a step with left LE.    Time 12    Period Weeks    Status Deferred    Target Date 04/26/20      PT LONG TERM GOAL #6   Title  Patient will be able to safety navigate up/down ramp with power chair, exhibiting good safety awareness, mod I to safely enter/exit his home.    Baseline 2/11: supervision with intermittent min A;4/1: supervision; 5/24: supervision. 03/01/2020- patient reports that he is able to navigate down the ramp at home to leave the home but continues to need assist to navigate up the ramp into the home.    Time 12    Period Weeks    Status Partially Met    Target Date 04/26/20      PT LONG TERM GOAL #7   Title Patient will increase functional reach test to >15 inches in sitting to exhibit improved sitting balance and positioning and reduce fall risk;    Baseline 07/30/18: 12 inches; 09/11/18: 10-12 inches; 10/23/18: 12-13 inch; 10/12: 14.5", 10/29: 15.5 inch    Time 4    Period Weeks    Status Achieved      PT LONG TERM GOAL #8   Title Patient will increase LLE quad strength to 3+/5 to improve functional strength for standing and mobility;    Baseline 10/28: 2+/5; 01/13/19: 2+/5, 2/11: 3-/5, 04/16/19: 3-/5, 05/08/19: 3-/5, 5/24: 3-/5, 6/21: 3-/5, 7/14: 3-/5, 12/21: 3-/5. 03/01/2020- Left quad= 3-/5 (focusing on quad strengthening during visits and with home program). 03/31/2020- left quad = 3-/5 with MMT.    Time 12    Period Weeks    Status On-going      PT LONG TERM GOAL  #9   TITLE Patient will report no vertigo with provoking motions or positions in order to be able to perform ADLs and mobility tasks without symptoms.    Baseline 06/30/19: reports no dizziness in last week; 6/21: one episode this weekend; 7/14: experiencing one a week; 03/01/2020.- Patient has denied any dizziness for over past 3 weeks.    Time 12    Period Weeks    Status Achieved      PT LONG TERM GOAL  #10   TITLE Patient will demonstrate improved functional LE/Postural strength as seen by ability to perform static standing for 2 min or greater with max assist  to assist with strength required for ADL's and transfers/pregait/gait  activities.    Baseline 03/01/2020- Patient able to stand approx 40 sec today with max assist in parallel bars. 03/31/2020- Patient able to demo static stand with max A for 69mn 47 sec today.    Time 8    Period Weeks    Target Date 04/26/20                 Plan - 04/08/20 1342    Clinical Impression Statement Treatment limited today secondary to patient presenting with increased overall fatigue with all activities- Unable to stand well or obtain upright posture and patient stated he was too tired to stand. He exhbited inability to maintain upright posture with sitting as well and unable to actively particpate well with sitting posture activities. He was able to perform some seated LE strengthening activities but easily distractible and increased difficulty concentrating today. He will continue to benefit from skilled PT services  to improve his functional strength and postural control for improved transfers/standing endurance.    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    Examination-Activity Limitations Bed Mobility;Bend;Sit;Toileting;Stand;Stairs;Lift;Transfers;Squat;Locomotion Level;Carry;Dressing;Hygiene/Grooming;Continence    Examination-Participation Restrictions Yard Work;Interpersonal Relationship;Community Activity    Stability/Clinical Decision Making Evolving/Moderate complexity    Rehab Potential Fair    PT Frequency 3x / week    PT Duration  12 weeks    PT Treatment/Interventions ADLs/Self Care Home Management;Electrical Stimulation;Moist Heat;Cryotherapy;Gait training;Stair training;Functional mobility training;Therapeutic exercise;Balance training;Neuromuscular re-education;Cognitive remediation;Patient/family education;Orthotic Fit/Training;Wheelchair mobility training;Manual techniques;Passive range of motion;Energy conservation;Joint Manipulations;Canalith Repostioning;Therapeutic activities;Vestibular    PT Next Visit Plan Continue with progressive postural/LE strengthening.    PT Home Exercise Plan no changes    Consulted and Agree with Plan of Care Patient;Family member/caregiver    Family Member Consulted Caregiver- [name]           Patient will benefit from skilled therapeutic intervention in order to improve the following deficits and impairments:  Abnormal gait,Decreased activity tolerance,Decreased cognition,Decreased endurance,Decreased knowledge of use of DME,Decreased range of motion,Decreased skin integrity,Decreased strength,Impaired perceived functional ability,Impaired sensation,Impaired UE functional use,Improper body mechanics,Pain,Cardiopulmonary status limiting activity,Decreased balance,Decreased coordination,Decreased mobility,Difficulty walking,Impaired tone,Postural dysfunction,Dizziness  Visit Diagnosis: Muscle weakness (generalized)  Difficulty in walking, not elsewhere classified     Problem List Patient Active Problem List   Diagnosis Date Noted  . Sepsis ([place]) 10/18/2018    Kenneth Summers 04/09/2020, 9:34 AM  [place] MAIN [facility] SERVICES [address] [place], [place], [address] Phone: [phone]   Fax:  [phone]  Name: Kenneth Summers MRN: [id number] Date of Birth: 1945/05/27

## 2020-04-12 ENCOUNTER — Encounter: Payer: Self-pay | Admitting: Occupational Therapy

## 2020-04-12 ENCOUNTER — Other Ambulatory Visit: Payer: Self-pay

## 2020-04-12 ENCOUNTER — Ambulatory Visit: Payer: Medicare Other | Admitting: Occupational Therapy

## 2020-04-12 ENCOUNTER — Ambulatory Visit: Payer: Medicare Other

## 2020-04-12 DIAGNOSIS — R278 Other lack of coordination: Secondary | ICD-10-CM

## 2020-04-12 DIAGNOSIS — M6281 Muscle weakness (generalized): Secondary | ICD-10-CM

## 2020-04-12 DIAGNOSIS — I69354 Hemiplegia and hemiparesis following cerebral infarction affecting left non-dominant side: Secondary | ICD-10-CM | POA: Diagnosis not present

## 2020-04-12 DIAGNOSIS — R262 Difficulty in walking, not elsewhere classified: Secondary | ICD-10-CM

## 2020-04-12 NOTE — Therapy (Signed)
[place] MAIN [facility] SERVICES [address] [place], [place], [address] Phone: [phone]   Fax:  [phone]  Occupational Therapy Treatment  Patient Details  Name: Kenneth Summers MRN: [id number] Date of Birth: May 18, 1945 No data recorded  Encounter Date: 04/12/2020   OT End of Session - 04/12/20 1351    Visit Number [id number]    Number of Visits 205    Date for OT Re-Evaluation 05/24/20    Authorization Type Progress report period starting 03/11/2020    OT Start Time 1345    OT Stop Time 1430    OT Time Calculation (min) 45 min    Activity Tolerance Patient tolerated treatment well    Behavior During Therapy [facility] for tasks assessed/performed           Past Medical History:  Diagnosis Date  . Actinic keratosis   . Alcohol abuse   . APS (antiphospholipid syndrome) ([place])   . Basal cell carcinoma   . BPH (benign prostatic hyperplasia)   . CAD (coronary artery disease)   . Cellulitis   . Cervical spinal stenosis   . Chronic osteomyelitis ([place])   . CKD (chronic kidney disease)   . CKD (chronic kidney disease)   . Clostridium difficile diarrhea   . Depression   . Diplopia   . Fatigue   . GERD (gastroesophageal reflux disease)   . Hyperlipemia   . Hypertension   . Hypovitaminosis D   . IBS (irritable bowel syndrome)   . 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 Insomnia   . Left lumbosacral radiculopathy   . Moderate obstructive sleep apnea   . Osteomyelitis of foot ([place])   . PAD (peripheral artery disease) ([place])   . Pruritus   . RA (rheumatoid arthritis) ([place])   . Radiculopathy   . Restless legs syndrome   . RLS (restless legs syndrome)   . SLE (systemic lupus erythematosus related syndrome) ([place])   . 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 Stroke ([place])   . Toxic maculopathy of both eyes     Past Surgical History:  Procedure Laterality Date  . CARDIAC CATHETERIZATION    . CAROTID ENDARTERECTOMY    .  CERVICAL LAMINECTOMY    . CORONARY ANGIOPLASTY    . FRACTURE SURGERY    . fusion C5-6-7    . HERNIA REPAIR    . KNEE ARTHROSCOPY    . TONSILLECTOMY      There were no vitals filed for this visit.   Subjective Assessment - 04/12/20 1350    Subjective  Pt. reports that he feels good today.    Patient is accompanied by: Family member    Pertinent History Pt. is a 75 y.o. 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 Pt. PMHx includes: Multiple Falls, Lupus, DJD, Renal Abscess, CVA. Pt. resides with his wife. Pt.'s wife and daughter assist with ADLs. Pt. has caregivers in  for 2 hours a day, 6 days a week. Pt. received Rehab services in acute care, at SNF for STR, and [place] services. Pt. is retired from [employer] for [employer] and [employer].    Currently in Pain? No/denies           OT TREATMENT   Therapeutic Exercise:  Pt. worked on [place] for reciprocal motion.Pt. performed2#dowelfor UE strengthening secondary to weakness.Pt.tolerated increased dowel weight.Bilateral shoulder flexion, chest press,andcircular patterns, were  performedfor1-2 sets10reps each.Pt. worked on3# dumbbell ex. forbilateralelbow flexion and extension, 4# for rightand3#left.Pt. performedforearm supination/pronation with 4# for the RUE, and 2# with the left for1 set of10-62for wrist extension, and radial deviation 1 set 20 reps.Tactile and verbal cues were required for eliciting the desired movement, and correct hand position.  Pt.continuestomakesteadyprogress with BUE strength, and tolerance for there.ex.Pt.continues to require less cues today.Pt.continues to require consistent verbal, visual, and tactile cues.Pt.didrequire cues for radial deviation.Pt. required a pillow for lateral support at the left to assume midline position.Pt.continues torequire less cuesto perform UE strengthening exercises, technique,  and formas well as visual demonstration prior to the exercises.Pt. continues to work on improving BUE strength, and [facility] skills in order to improve LUE functioning during ADLs, IADLs, and tranfers.                         OT Education - 04/12/20 1351    Education Details strengthening, UE functioning    Person(s) Educated Patient;Spouse    Methods Explanation;Demonstration    Comprehension Returned demonstration;Tactile cues required;Verbalized understanding;Need further instruction               OT Long Term Goals - 03/11/20 1315      OT LONG TERM GOAL #1   Title Pt. will increase left shoulder flexion AROM by 10 degrees to access cabinet/shelf.    Baseline Pt. conitnues to present with limited left shoulder ROM limitng access to cabinets, and shelves.03/01/2020: Left shoulder 116 degrees with new onset of numbness radiating from the left side of the neck to the hand. Pt. continues to progress with left shoulder ROM .Shouler flexion 130, and continues to work on reaching with increasing emphasis on flexing trunk to improve functional reach.  07/21/19- shoulder flexion to 130 with effort    Time 12    Period Weeks    Status On-going    Target Date 05/24/20      OT LONG TERM GOAL #2   Title Pt. will donn a shirt with Supervision.    Baseline Pt. continues to require modA donning a zip down jacket with consistent cues to initiate the correct direction of the jacket.  Min-modA mahaging the zipper. MinA for donning a T-shirt with assist to roll side to side to pull the shirt down over his back. 07/21/19 continues to require min assist at times for shirt/jacket    Time 12    Period Weeks    Status On-going    Target Date 05/24/20      OT LONG TERM GOAL #4   Title Pt. will improve left grip strength by 10# to be able to open a jar/container.    Baseline Grip strength conitnues to be limited. Decreased left sided grip strength today secondary to a new onset of  numbenss, and weakness radiating from the neck to the left hand. Pt. continues to have a harder time opening a wide mouthed jar, however is able to open a smaller jar easier.    Time 12    Period Weeks    Status On-going    Target Date 05/24/20      OT LONG TERM GOAL #5   Title Pt. will improve left hand [facility] skills to be able to assist with buttoning/zipping.    Baseline Pt. conitnues to work on improving [facility] skills  to improve buttoning , and zipping.03/01/2020: Pt.has had a new onset of weakness, and numbness in the left hand beginning upon waking up  this morning which has resulted in a significant change in [facility] skills.  Pt. has improved overall donning a jacket, and  manipulating the zipper on his jacket, however continues to require practice depending on the type of zipper jacket. Pt. continues to work on improving bilateral [facility] skills for buttoning/zipping.  07/21/19 improving with buttons and zippers.    Time 12    Period Weeks    Status On-going    Target Date 05/24/20      OT LONG TERM GOAL #6   Title Pt. will demonstrate visual conmpensatory strategies for 100% of the time during ADLs    Baseline Improving, however Pt. continues to require intermittent cues cues for left sides awareness.    Time 12    Period Weeks    Status On-going      OT LONG TERM GOAL #8   Title Pt. will accurately identify potential safety hazard using good safety awareness, and judgement 100% for ADLs, and IADLs.    Baseline Pt. continues to require cues    Time 12    Period Weeks    Status On-going    Target Date 05/24/20      OT LONG TERM GOAL  #9   TITLE Pt. will  navigate his w/c around obstacles with Supervision and 100% accuracy    Baseline Pt. continues to require cuing for obstacles on the left, however requires less cues overall. Pt. requires significant cues to engage his left UE during tasks. 07/21/2019 continues to improve with decreased assist at times    Time 12    Period Weeks    Status  On-going    Target Date 05/24/20      OT LONG TERM GOAL  #10   TITLE Pt. will independently be able  to reach to place items into cabinetry, and closets.    Baseline Pt. continues to have difficulty with functional reaching conitnues to be limited.    Time 12    Period Weeks    Status On-going    Target Date 05/24/20      OT LONG TERM GOAL  #11   TITLE Pt. will increase BUE strength by 17mm grades in preparation for ADL transfers.    Baseline Pt. BUE strength conitnues to be limited. Pt. is able to tolerate increased resistive weights. Pt. continues to work towards improving overall strength for ADLs.    Time 12    Period Weeks    Status On-going    Target Date 05/24/20      OT LONG TERM GOAL  #12   TITLE Pt. will improve LUE functional reaching with the left engaging the trunk 100% of the time during ADL tasks with minimal cues.    Baseline Left sided numbness has improved. New onset of left sided numbness, and weakness is affecting his ability to perform reaching.    Time 12    Period Weeks    Status On-going    Target Date 05/24/20                 Plan - 04/12/20 1351    Clinical Impression Statement Pt.continuestomakesteadyprogress with BUE strength, and tolerance for there.ex.Pt.continues to require less cues today.Pt.continues to require consistent verbal, visual, and tactile cues.Pt.didrequire cues for radial deviation.Pt. required a pillow for lateral support at the left to assume midline position.Pt.continues torequire less cuesto perform UE strengthening exercises, technique, and formas well as visual demonstration prior to the exercises.Pt. continues to work on improving BUE strength, and  [facility] skills in order to improve LUE functioning during ADLs, IADLs, and tranfers.   OT Occupational Profile and History Problem Focused Assessment - Including review of records relating to presenting problem    Occupational performance deficits (Please refer to  evaluation for details): ADL's    Body Structure / Function / Physical Skills UE functional use;Coordination;FMC;Dexterity;Strength;ROM    Cognitive Skills Attention;Memory;Emotional;Problem Solve;Safety Awareness    Rehab Potential Good    Clinical Decision Making Several treatment options, min-mod task modification necessary    Comorbidities Affecting Occupational Performance: Presence of comorbidities impacting occupational performance    Modification or Assistance to Complete Evaluation  No modification of tasks or assist necessary to complete eval    OT Frequency 2x / week    OT Duration 12 weeks    OT Treatment/Interventions Self-care/ADL training;DME and/or AE instruction;Therapeutic exercise;Therapeutic activities;Moist Heat;Cognitive remediation/compensation;Neuromuscular education;Visual/perceptual remediation/compensation;Coping strategies training;Patient/family education;Passive range of motion;Psychosocial skills training;Energy conservation;Functional Mobility Training    Family Member Consulted daughter [name]           Patient will benefit from skilled therapeutic intervention in order to improve the following deficits and impairments:   Body Structure / Function / Physical Skills: UE functional use,Coordination,FMC,Dexterity,Strength,ROM Cognitive Skills: Attention,Memory,Emotional,Problem Solve,Safety Awareness     Visit Diagnosis: Muscle weakness (generalized)  Other lack of coordination    Problem List Patient Active Problem List   Diagnosis Date Noted  . Sepsis ([place]) 10/18/2018    Harrel Carina, MS, OTR/L 04/12/2020, 1:53 PM  [place] MAIN [facility] SERVICES [address] [place], [place], [address] Phone: [phone]   Fax:  [phone]  Name: TRINDON DORTON MRN: [id number] Date of Birth: 12/24/1945

## 2020-04-13 NOTE — Therapy (Signed)
[place] MAIN [facility] SERVICES [address] [place], [place], [address] Phone: [phone]   Fax:  [phone]  Physical Therapy Treatment  Patient Details  Name: Kenneth Summers MRN: [id number] Date of Birth: [birth year] No data recorded  Encounter Date: 04/12/2020   PT End of Session - 04/12/20 1231    Visit Number [id number]    Number of Visits 252    Date for PT Re-Evaluation 04/19/20    Authorization Type Medicare reporting period starting 09/11/18; NO FOTO    Authorization Time Period goals last assessed on 03/01/2020    PT Start Time 1259    PT Stop Time 1343    PT Time Calculation (min) 44 min    Equipment Utilized During Treatment Gait belt    Activity Tolerance No increased pain;Patient limited by fatigue;Patient tolerated treatment well    Behavior During Therapy [facility] for tasks assessed/performed           Past Medical History:  Diagnosis Date  . Actinic keratosis   . Alcohol abuse   . APS (antiphospholipid syndrome) ([place])   . Basal cell carcinoma   . BPH (benign prostatic hyperplasia)   . CAD (coronary artery disease)   . Cellulitis   . Cervical spinal stenosis   . Chronic osteomyelitis ([place])   . CKD (chronic kidney disease)   . CKD (chronic kidney disease)   . Clostridium difficile diarrhea   . Depression   . Diplopia   . Fatigue   . GERD (gastroesophageal reflux disease)   . Hyperlipemia   . Hypertension   . Hypovitaminosis D   . IBS (irritable bowel syndrome)   . 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 Insomnia   . Left lumbosacral radiculopathy   . Moderate obstructive sleep apnea   . Osteomyelitis of foot ([place])   . PAD (peripheral artery disease) ([place])   . Pruritus   . RA (rheumatoid arthritis) ([place])   . Radiculopathy   . Restless legs syndrome   . RLS (restless legs syndrome)   . SLE (systemic lupus erythematosus related syndrome) ([place])   . 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 Stroke  ([place])   . Toxic maculopathy of both eyes     Past Surgical History:  Procedure Laterality Date  . CARDIAC CATHETERIZATION    . CAROTID ENDARTERECTOMY    . CERVICAL LAMINECTOMY    . CORONARY ANGIOPLASTY    . FRACTURE SURGERY    . fusion C5-6-7    . HERNIA REPAIR    . KNEE ARTHROSCOPY    . TONSILLECTOMY      There were no vitals filed for this visit.   Subjective Assessment - 04/12/20 1228    Subjective Patient reports feeling more energized today. States he went home after last visit and went to bed.    Patient is accompained by: Family member    Pertinent History Patient is a 75 y.o. male who presents to outpatient physical therapy with a referral for medical diagnosis of CVA. This patient's chief complaints consist of left hemiplegia and overall deconditioning leading to the following functional deficits: dependent for ADLs, IADLs, unable to transfer to car, dependent transfers at home with hoyer lift, unable to walk or stand without significant assistance, difficulty with W/C navigation..    Limitations Sitting;Lifting;Standing;House hold activities;Writing;Walking;Other (comment)    Diagnostic tests Brain MRI 11/01/2017: IMPRESSION: 1. Acute right ACA territory infarct as demonstrated on prior CT imaging.  No intracranial hemorrhage or significant mass effect. 2. 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    Patient Stated Goals wants to be able to walk again    Currently in Pain? No/denies    Pain Onset Today    Pain Onset Today                Treatment  Max assist to stand from 27 in heigh of power chair- using BUE on parallel bars to pull up.  4 trials of static standing:  1) 1 min 20 sec 2) 1 min 10  sec 3) 62 sec 4) 1 min 11  Sec 5) 38 sec  6) 33 sec 7) 42 sec  Pt educated throughout session about proper posture and technique with exercises. Improved exercise technique, movement at target joints, use of target muscles after  min to mod verbal, visual, tactile cues.  Patient improved with erect standing with less overall physical assist particularly on trials 2, 4, 5                    PT Education - 04/13/20 1229    Education Details specific postural and transfer cues.    Person(s) Educated Patient    Methods Explanation;Demonstration;Tactile cues;Verbal cues    Comprehension Verbalized understanding;Verbal cues required;Returned demonstration;Tactile cues required;Need further instruction            PT Short Term Goals - 01/26/20 1308      PT SHORT TERM GOAL #1   Title Be independent with initial home exercise program for self-management of symptoms.    Baseline HEP to be given at second session; advice to practice unsupported sitting at home (06/19/2018); 07/08/18: Performing at home, 6/29: needs assistance but is doing exercise program regularly; 08/22/18: adherent;    Time 2    Period Weeks    Status Achieved    Target Date 06/26/18             PT Long Term Goals - 03/31/20 1343      PT LONG TERM GOAL #1   Title Patient will complete all bed mobility with min A to improve functional independence for getting in and out of bed and adjusting in bed.     Baseline max A - total A reported by family (06/19/2018); 07/08/18: still requires maxA, dtr reports improvement in rolling since starting therapy; 6/23: min A to supervision in clinic; not doing at home right; 8/5: Pt's daughter states that his rolling has improved, but still has difficulty with all other bed mobility; 10/23/18: pts daughter states that she is doing about 35% of the work if he is in proper position, he does uses the bed rails to help roll;  using Harrel Lemon for coming to sit EOB; 10/12: pts daughter states that she is doing about 30% of the work if he is in proper position, he does uses the bed rails to help roll, but it is improving;  using Harrel Lemon for coming to sit EOB, 10/29: min A for sit to sidelying, rolling supervision, able  to transition sidelying to sit with HHA; 12/21: min-mod A. 03/01/2020- Patient presents improving bed mobility with current ability to roll to left with CGA and verbal and tactile cues and min assist to roll to right as seen by recent visits. 03/31/2020- Patient and caregiver report that he has maintained ability to roll side to side with only minimal assistance.    Time 12    Period Weeks  Status Achieved    Target Date 04/26/20      PT LONG TERM GOAL #2   Title Patient will complete sit <> stand transfer chair to chair with Min A and LRAD to improve functional independence for household and community mobility.     Baseline min A for STS, still requires elevated surface height to 27".  03/01/20- Patient currently unable to perform a chair to chair transfer and continues to be transferred using hoyer lift. Patient has been training with sit to stand at parallel bars and able to currently perform sit to stand with max assist  from 27in. high surface.03/31/2020- Patient remains at [facility] assist with sit to stand transfer and continued use of hoyer lift transfer for all chair to chair or bed to chair transfers.    Time 12    Period Weeks    Status On-going    Target Date 04/26/20      PT LONG TERM GOAL #3   Title Patient will navigate power w/c with min A x 100 feet to improve mobility for household and short community distances.    Baseline required total A (06/12/2018); able to roll 20 feet with min A (06/20/2018); 07/08/18: Pt can go approximately 10' without assist per family;  10/23/2018: pt's daughter states that his power WC mobility has become 100% better with the new hand control, slowed speed, and with practice; 10/12: pt's daughter reports that he can perform household WC mobility and limited community ambulation with minA    Time 12    Period Weeks    Status Achieved    Target Date 04/26/20      PT LONG TERM GOAL #4   Title Patient will complete car transfers W/C to family SUV with min A using  LRAD to improve his abilty to participate in community activities.     Baseline unable to complete car transfers at all (06/12/2018); unable to complete car transfers at all (06/19/2018).; 07/08/18: Unable to perform at this time; 09/11/18: unable to perform at this time; 10/23/2018: unable to complete car transfers at all; 10/12:  unable to complete car transfers at all, 10/29: unable; 01/13/19: unable; 2/11: min-mod A +2, 3/10: min A+1, 12/21: mod A. 03/01/2020- Patient unable to complete car transfers and continues to require medical transportation at this time.    Time 12    Period Weeks      PT LONG TERM GOAL #5   Title Patient will ambulate at least 10 feet with min A using LRAD to improve mobility for household and community distances.    Baseline unable to take steps (06/12/2018); able to shift R leg forward and back with max A and RW at edge of plinth (06/19/2018); 07/08/18: unable to ambulate at this time. 09/11/18: unable to perform at this time; 10/23/2018: unable to perform at this time; 10/12: unable to perform at this time, 10/29: unable at this time; 01/13/19: unable at this time, 2/11: min A to step with RLE, unable to step with LLE, 3/10: able to take 2 steps in parallel bars with min A for safety; 4/1: min A for walking 10 feet in parallel bars, 5/24: min A walking 10 feet in parallel bars; 6/21: no change 7/14: no change. 03/01/2020- Patient unable to ambulate and currently focusing on static standing. He was able to take a forward step with his right LE last week but unable today. 03/31/2020- Patient continues to be non-ambulatory but has been able to move right LE forward with  standing but unable to take a step with left LE.    Time 12    Period Weeks    Status Deferred    Target Date 04/26/20      PT LONG TERM GOAL #6   Title Patient will be able to safety navigate up/down ramp with power chair, exhibiting good safety awareness, mod I to safely enter/exit his home.    Baseline 2/11: supervision  with intermittent min A;4/1: supervision; 5/24: supervision. 03/01/2020- patient reports that he is able to navigate down the ramp at home to leave the home but continues to need assist to navigate up the ramp into the home.    Time 12    Period Weeks    Status Partially Met    Target Date 04/26/20      PT LONG TERM GOAL #7   Title Patient will increase functional reach test to >15 inches in sitting to exhibit improved sitting balance and positioning and reduce fall risk;    Baseline 07/30/18: 12 inches; 09/11/18: 10-12 inches; 10/23/18: 12-13 inch; 10/12: 14.5", 10/29: 15.5 inch    Time 4    Period Weeks    Status Achieved      PT LONG TERM GOAL #8   Title Patient will increase LLE quad strength to 3+/5 to improve functional strength for standing and mobility;    Baseline 10/28: 2+/5; 01/13/19: 2+/5, 2/11: 3-/5, 04/16/19: 3-/5, 05/08/19: 3-/5, 5/24: 3-/5, 6/21: 3-/5, 7/14: 3-/5, 12/21: 3-/5. 03/01/2020- Left quad= 3-/5 (focusing on quad strengthening during visits and with home program). 03/31/2020- left quad = 3-/5 with MMT.    Time 12    Period Weeks    Status On-going      PT LONG TERM GOAL  #9   TITLE Patient will report no vertigo with provoking motions or positions in order to be able to perform ADLs and mobility tasks without symptoms.    Baseline 06/30/19: reports no dizziness in last week; 6/21: one episode this weekend; 7/14: experiencing one a week; 03/01/2020.- Patient has denied any dizziness for over past 3 weeks.    Time 12    Period Weeks    Status Achieved      PT LONG TERM GOAL  #10   TITLE Patient will demonstrate improved functional LE/Postural strength as seen by ability to perform static standing for 2 min or greater with max assist  to assist with strength required for ADL's and transfers/pregait/gait activities.    Baseline 03/01/2020- Patient able to stand approx 40 sec today with max assist in parallel bars. 03/31/2020- Patient able to demo static stand with max A for  15min 47 sec today.    Time 8    Period Weeks    Target Date 04/26/20                 Plan - 04/12/20 1240    Clinical Impression Statement Patient much more alert today and able to maintain good neutral sitting posture throughout treatment along with some improvement with static standing. He was able to stand today with intermittent less physical assist as well as more trials and he will continue to benefit from skilled PT services to improve his functional strength and postural control for improved transfers/standing endurance.    Personal Factors and Comorbidities Age;Comorbidity 3+    Comorbidities Relevant past medical history and comorbidities include long term steroid use for lupus, CKD, chronic osteomyelitis, cervical spine stenosis, BPH, APS, alcohol abuse, peripheral artery disease, depression, GERD, obstructive  sleep apnea, HTN, IBS, IgA deficiency, lumbar radiculopathy, RA, Stroke, toxic maculopathy in both eyes, systemic lupus erythematosus related syndrome, cardiac catheterization, carotid endarterectomy, cervical laminectomy, coronary angioplasty, Fusion C5-C7, knee arthroplasty, lumbar surgery.    Examination-Activity Limitations Bed Mobility;Bend;Sit;Toileting;Stand;Stairs;Lift;Transfers;Squat;Locomotion Level;Carry;Dressing;Hygiene/Grooming;Continence    Examination-Participation Restrictions Yard Work;Interpersonal Relationship;Community Activity    Stability/Clinical Decision Making Evolving/Moderate complexity    Rehab Potential Fair    PT Frequency 3x / week    PT Duration 12 weeks    PT Treatment/Interventions ADLs/Self Care Home Management;Electrical Stimulation;Moist Heat;Cryotherapy;Gait training;Stair training;Functional mobility training;Therapeutic exercise;Balance training;Neuromuscular re-education;Cognitive remediation;Patient/family education;Orthotic Fit/Training;Wheelchair mobility training;Manual techniques;Passive range of motion;Energy conservation;Joint  Manipulations;Canalith Repostioning;Therapeutic activities;Vestibular    PT Next Visit Plan Continue with progressive postural/LE strengthening.    PT Home Exercise Plan no changes    Consulted and Agree with Plan of Care Patient;Family member/caregiver    Family Member Consulted Caregiver- [name]           Patient will benefit from skilled therapeutic intervention in order to improve the following deficits and impairments:  Abnormal gait,Decreased activity tolerance,Decreased cognition,Decreased endurance,Decreased knowledge of use of DME,Decreased range of motion,Decreased skin integrity,Decreased strength,Impaired perceived functional ability,Impaired sensation,Impaired UE functional use,Improper body mechanics,Pain,Cardiopulmonary status limiting activity,Decreased balance,Decreased coordination,Decreased mobility,Difficulty walking,Impaired tone,Postural dysfunction,Dizziness  Visit Diagnosis: Muscle weakness (generalized)  Other lack of coordination  Difficulty in walking, not elsewhere classified     Problem List Patient Active Problem List   Diagnosis Date Noted  . Sepsis ([place]) 10/18/2018    Lewis Moccasin, PT 04/13/2020, 12:46 PM  [place] MAIN [facility] SERVICES [address] [place], [place], [address] Phone: [phone]   Fax:  [phone]  Name: Kenneth Summers MRN: [id number] Date of Birth: Jun 19, 1945

## 2020-04-14 ENCOUNTER — Ambulatory Visit: Payer: Medicare Other

## 2020-04-14 ENCOUNTER — Other Ambulatory Visit: Payer: Self-pay

## 2020-04-14 DIAGNOSIS — I69354 Hemiplegia and hemiparesis following cerebral infarction affecting left non-dominant side: Secondary | ICD-10-CM | POA: Diagnosis not present

## 2020-04-14 DIAGNOSIS — R262 Difficulty in walking, not elsewhere classified: Secondary | ICD-10-CM

## 2020-04-14 DIAGNOSIS — M6281 Muscle weakness (generalized): Secondary | ICD-10-CM

## 2020-04-14 DIAGNOSIS — R278 Other lack of coordination: Secondary | ICD-10-CM

## 2020-04-14 NOTE — Therapy (Signed)
[place] MAIN [facility] SERVICES [address] [place], [place], [address] Phone: [phone]   Fax:  [phone]  Physical Therapy Treatment  Patient Details  Name: Kenneth Summers MRN: [id number] Date of Birth: [birth year] No data recorded  Encounter Date: 04/14/2020   PT End of Session - 04/14/20 1318    Visit Number [id number]    Number of Visits 252    Date for PT Re-Evaluation 04/19/20    Authorization Type Medicare reporting period starting 09/11/18; NO FOTO    Authorization Time Period goals last assessed on 03/01/2020    PT Start Time 1305    PT Stop Time 1344    PT Time Calculation (min) 39 min    Equipment Utilized During Treatment Gait belt    Activity Tolerance No increased pain;Patient limited by fatigue;Patient tolerated treatment well    Behavior During Therapy [facility] for tasks assessed/performed           Past Medical History:  Diagnosis Date  . Actinic keratosis   . Alcohol abuse   . APS (antiphospholipid syndrome) ([place])   . Basal cell carcinoma   . BPH (benign prostatic hyperplasia)   . CAD (coronary artery disease)   . Cellulitis   . Cervical spinal stenosis   . Chronic osteomyelitis ([place])   . CKD (chronic kidney disease)   . CKD (chronic kidney disease)   . Clostridium difficile diarrhea   . Depression   . Diplopia   . Fatigue   . GERD (gastroesophageal reflux disease)   . Hyperlipemia   . Hypertension   . Hypovitaminosis D   . IBS (irritable bowel syndrome)   . 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 Insomnia   . Left lumbosacral radiculopathy   . Moderate obstructive sleep apnea   . Osteomyelitis of foot ([place])   . PAD (peripheral artery disease) ([place])   . Pruritus   . RA (rheumatoid arthritis) ([place])   . Radiculopathy   . Restless legs syndrome   . RLS (restless legs syndrome)   . SLE (systemic lupus erythematosus related syndrome) ([place])   . 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 Stroke  ([place])   . Toxic maculopathy of both eyes     Past Surgical History:  Procedure Laterality Date  . CARDIAC CATHETERIZATION    . CAROTID ENDARTERECTOMY    . CERVICAL LAMINECTOMY    . CORONARY ANGIOPLASTY    . FRACTURE SURGERY    . fusion C5-6-7    . HERNIA REPAIR    . KNEE ARTHROSCOPY    . TONSILLECTOMY      There were no vitals filed for this visit.   Subjective Assessment - 04/14/20 1307    Subjective Patient reports feeling good today    Patient is accompained by: Family member    Pertinent History Patient is a 75 y.o. male who presents to outpatient physical therapy with a referral for medical diagnosis of CVA. This patient's chief complaints consist of left hemiplegia and overall deconditioning leading to the following functional deficits: dependent for ADLs, IADLs, unable to transfer to car, dependent transfers at home with hoyer lift, unable to walk or stand without significant assistance, difficulty with W/C navigation..    Limitations Sitting;Lifting;Standing;House hold activities;Writing;Walking;Other (comment)    Diagnostic tests Brain MRI 11/01/2017: IMPRESSION: 1. Acute right ACA territory infarct as demonstrated on prior CT imaging. No intracranial hemorrhage or significant mass effect. 2. 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    Patient Stated Goals wants to be able to walk again    Currently in Pain? No/denies    Pain Onset Today    Pain Onset Today             Treatment  Max assist to stand from 24 in heigh of power chair- using BUE on parallel bars to pull up *FROM THE SIDE OF //Bars today which is new for patient and allows him to pull up more with arms but then has to weight bear more through his LE with standing due to location/positioning.   trials of static standing:  1) 1 min 20 sec 2) 38  Sec- stopped secondary to BLE knee weakness 3) 47sec 4) 1 min 15  Sec 5) 58 sec  6)  35 sec- Patient reported stopping due to  Knees "giving way" 7) 28 sec- Stopped to fatigue in B knees.   Patient able to stand better with less overall assist yet more weight through legs during standing leading to increased overall fatigue.    Pt educated throughout session about proper posture and technique with exercises. Improved exercise technique, movement at target joints, use of target muscles after min to mod verbal, visual, tactile cues.                           PT Education - 04/15/20 1217    Education Details postural cues with standing and sitting.    Person(s) Educated Patient    Methods Explanation;Demonstration;Tactile cues;Verbal cues    Comprehension Verbalized understanding;Need further instruction;Verbal cues required;Returned demonstration;Tactile cues required            PT Short Term Goals - 01/26/20 1308      PT SHORT TERM GOAL #1   Title Be independent with initial home exercise program for self-management of symptoms.    Baseline HEP to be given at second session; advice to practice unsupported sitting at home (06/19/2018); 07/08/18: Performing at home, 6/29: needs assistance but is doing exercise program regularly; 08/22/18: adherent;    Time 2    Period Weeks    Status Achieved    Target Date 06/26/18             PT Long Term Goals - 03/31/20 1343      PT LONG TERM GOAL #1   Title Patient will complete all bed mobility with min A to improve functional independence for getting in and out of bed and adjusting in bed.     Baseline max A - total A reported by family (06/19/2018); 07/08/18: still requires maxA, dtr reports improvement in rolling since starting therapy; 6/23: min A to supervision in clinic; not doing at home right; 8/5: Pt's daughter states that his rolling has improved, but still has difficulty with all other bed mobility; 10/23/18: pts daughter states that she is doing about 35% of the work if he is in proper position, he does uses the bed rails to help roll;   using Hoyer for coming to sit EOB; 10/12: pts daughter states that she is doing about 30% of the work if he is in proper position, he does uses the bed rails to help roll, but it is improving;  using Hoyer for coming to sit EOB, 10/29: min A for sit to sidelying, rolling supervision, able to transition sidelying to sit with HHA; 12/21: min-mod A. 03/01/2020- Patient presents improving bed mobility with  current ability to roll to left with CGA and verbal and tactile cues and min assist to roll to right as seen by recent visits. 03/31/2020- Patient and caregiver report that he has maintained ability to roll side to side with only minimal assistance.    Time 12    Period Weeks    Status Achieved    Target Date 04/26/20      PT LONG TERM GOAL #2   Title Patient will complete sit <> stand transfer chair to chair with Min A and LRAD to improve functional independence for household and community mobility.     Baseline min A for STS, still requires elevated surface height to 27".  03/01/20- Patient currently unable to perform a chair to chair transfer and continues to be transferred using hoyer lift. Patient has been training with sit to stand at parallel bars and able to currently perform sit to stand with max assist  from 27in. high surface.03/31/2020- Patient remains at [facility] assist with sit to stand transfer and continued use of hoyer lift transfer for all chair to chair or bed to chair transfers.    Time 12    Period Weeks    Status On-going    Target Date 04/26/20      PT LONG TERM GOAL #3   Title Patient will navigate power w/c with min A x 100 feet to improve mobility for household and short community distances.    Baseline required total A (06/12/2018); able to roll 20 feet with min A (06/20/2018); 07/08/18: Pt can go approximately 10' without assist per family;  10/23/2018: pt's daughter states that his power WC mobility has become 100% better with the new hand control, slowed speed, and with practice;  10/12: pt's daughter reports that he can perform household WC mobility and limited community ambulation with minA    Time 12    Period Weeks    Status Achieved    Target Date 04/26/20      PT LONG TERM GOAL #4   Title Patient will complete car transfers W/C to family SUV with min A using LRAD to improve his abilty to participate in community activities.     Baseline unable to complete car transfers at all (06/12/2018); unable to complete car transfers at all (06/19/2018).; 07/08/18: Unable to perform at this time; 09/11/18: unable to perform at this time; 10/23/2018: unable to complete car transfers at all; 10/12:  unable to complete car transfers at all, 10/29: unable; 01/13/19: unable; 2/11: min-mod A +2, 3/10: min A+1, 12/21: mod A. 03/01/2020- Patient unable to complete car transfers and continues to require medical transportation at this time.    Time 12    Period Weeks      PT LONG TERM GOAL #5   Title Patient will ambulate at least 10 feet with min A using LRAD to improve mobility for household and community distances.    Baseline unable to take steps (06/12/2018); able to shift R leg forward and back with max A and RW at edge of plinth (06/19/2018); 07/08/18: unable to ambulate at this time. 09/11/18: unable to perform at this time; 10/23/2018: unable to perform at this time; 10/12: unable to perform at this time, 10/29: unable at this time; 01/13/19: unable at this time, 2/11: min A to step with RLE, unable to step with LLE, 3/10: able to take 2 steps in parallel bars with min A for safety; 4/1: min A for walking 10 feet in parallel bars, 5/24: min  A walking 10 feet in parallel bars; 6/21: no change 7/14: no change. 03/01/2020- Patient unable to ambulate and currently focusing on static standing. He was able to take a forward step with his right LE last week but unable today. 03/31/2020- Patient continues to be non-ambulatory but has been able to move right LE forward with standing but unable to take a step with  left LE.    Time 12    Period Weeks    Status Deferred    Target Date 04/26/20      PT LONG TERM GOAL #6   Title Patient will be able to safety navigate up/down ramp with power chair, exhibiting good safety awareness, mod I to safely enter/exit his home.    Baseline 2/11: supervision with intermittent min A;4/1: supervision; 5/24: supervision. 03/01/2020- patient reports that he is able to navigate down the ramp at home to leave the home but continues to need assist to navigate up the ramp into the home.    Time 12    Period Weeks    Status Partially Met    Target Date 04/26/20      PT LONG TERM GOAL #7   Title Patient will increase functional reach test to >15 inches in sitting to exhibit improved sitting balance and positioning and reduce fall risk;    Baseline 07/30/18: 12 inches; 09/11/18: 10-12 inches; 10/23/18: 12-13 inch; 10/12: 14.5", 10/29: 15.5 inch    Time 4    Period Weeks    Status Achieved      PT LONG TERM GOAL #8   Title Patient will increase LLE quad strength to 3+/5 to improve functional strength for standing and mobility;    Baseline 10/28: 2+/5; 01/13/19: 2+/5, 2/11: 3-/5, 04/16/19: 3-/5, 05/08/19: 3-/5, 5/24: 3-/5, 6/21: 3-/5, 7/14: 3-/5, 12/21: 3-/5. 03/01/2020- Left quad= 3-/5 (focusing on quad strengthening during visits and with home program). 03/31/2020- left quad = 3-/5 with MMT.    Time 12    Period Weeks    Status On-going      PT LONG TERM GOAL  #9   TITLE Patient will report no vertigo with provoking motions or positions in order to be able to perform ADLs and mobility tasks without symptoms.    Baseline 06/30/19: reports no dizziness in last week; 6/21: one episode this weekend; 7/14: experiencing one a week; 03/01/2020.- Patient has denied any dizziness for over past 3 weeks.    Time 12    Period Weeks    Status Achieved      PT LONG TERM GOAL  #10   TITLE Patient will demonstrate improved functional LE/Postural strength as seen by ability to perform static  standing for 2 min or greater with max assist  to assist with strength required for ADL's and transfers/pregait/gait activities.    Baseline 03/01/2020- Patient able to stand approx 40 sec today with max assist in parallel bars. 03/31/2020- Patient able to demo static stand with max A for 77mn 47 sec today.    Time 8    Period Weeks    Target Date 04/26/20                 Plan - 04/14/20 1318    Clinical Impression Statement Patient was challenged with new technique for standing at side of //bars. He was able to pull up using bars better with slightly less assistance yet experiencing he will continue to benefit from skilled PT services to improve his functional strength and postural control for  improved transfers/standing endurance.    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    Examination-Activity Limitations Bed Mobility;Bend;Sit;Toileting;Stand;Stairs;Lift;Transfers;Squat;Locomotion Level;Carry;Dressing;Hygiene/Grooming;Continence    Examination-Participation Restrictions Yard Work;Interpersonal Relationship;Community Activity    Stability/Clinical Decision Making Evolving/Moderate complexity    Rehab Potential Fair    PT Frequency 3x / week    PT Duration 12 weeks    PT Treatment/Interventions ADLs/Self Care Home Management;Electrical Stimulation;Moist Heat;Cryotherapy;Gait training;Stair training;Functional mobility training;Therapeutic  exercise;Balance training;Neuromuscular re-education;Cognitive remediation;Patient/family education;Orthotic Fit/Training;Wheelchair mobility training;Manual techniques;Passive range of motion;Energy conservation;Joint Manipulations;Canalith Repostioning;Therapeutic activities;Vestibular    PT Next Visit Plan Continue with progressive postural/LE strengthening.    PT Home Exercise Plan no changes    Consulted and Agree with Plan of Care Patient;Family member/caregiver    Family Member Consulted Caregiver- [place]           Patient will benefit from skilled therapeutic intervention in order to improve the following deficits and impairments:  Abnormal gait,Decreased activity tolerance,Decreased cognition,Decreased endurance,Decreased knowledge of use of DME,Decreased range of motion,Decreased skin integrity,Decreased strength,Impaired perceived functional ability,Impaired sensation,Impaired UE functional use,Improper body mechanics,Pain,Cardiopulmonary status limiting activity,Decreased balance,Decreased coordination,Decreased mobility,Difficulty walking,Impaired tone,Postural dysfunction,Dizziness  Visit Diagnosis: Muscle weakness (generalized)  Other lack of coordination  Difficulty in walking, not elsewhere classified     Problem List Patient Active Problem List   Diagnosis Date Noted  . Sepsis ([place]) 10/18/2018    Kathlee Nations. Dejanay Wamboldt, PT 04/15/2020, 12:26 PM  [place] MAIN [facility] SERVICES [address] [place], [place], [address] Phone: [phone]   Fax:  [phone]  Name: AYDEN HARDWICK MRN: [id number] Date of Birth: 1945/09/25

## 2020-04-15 ENCOUNTER — Ambulatory Visit: Payer: Medicare Other

## 2020-04-15 ENCOUNTER — Encounter: Payer: Self-pay | Admitting: Occupational Therapy

## 2020-04-15 ENCOUNTER — Other Ambulatory Visit: Payer: Self-pay

## 2020-04-15 ENCOUNTER — Ambulatory Visit: Payer: Medicare Other | Admitting: Occupational Therapy

## 2020-04-15 DIAGNOSIS — R262 Difficulty in walking, not elsewhere classified: Secondary | ICD-10-CM

## 2020-04-15 DIAGNOSIS — R296 Repeated falls: Secondary | ICD-10-CM

## 2020-04-15 DIAGNOSIS — R278 Other lack of coordination: Secondary | ICD-10-CM

## 2020-04-15 DIAGNOSIS — I69354 Hemiplegia and hemiparesis following cerebral infarction affecting left non-dominant side: Secondary | ICD-10-CM | POA: Diagnosis not present

## 2020-04-15 DIAGNOSIS — M6281 Muscle weakness (generalized): Secondary | ICD-10-CM

## 2020-04-15 NOTE — Therapy (Signed)
[place] MAIN [facility] SERVICES [address] [place], [place], [address] Phone: [phone]   Fax:  [phone]  Occupational Therapy Treatment  Patient Details  Name: Kenneth Summers MRN: [id number] Date of Birth: [birth year] No data recorded  Encounter Date: 04/15/2020   OT End of Session - 04/15/20 1307    Visit Number [id number]    Number of Visits 205    Date for OT Re-Evaluation 05/24/20    Authorization Type Progress report period starting 03/11/2020    OT Start Time 1300    OT Stop Time 1345    OT Time Calculation (min) 45 min    Activity Tolerance Patient tolerated treatment well    Behavior During Therapy [facility] for tasks assessed/performed           Past Medical History:  Diagnosis Date  . Actinic keratosis   . Alcohol abuse   . APS (antiphospholipid syndrome) ([place])   . Basal cell carcinoma   . BPH (benign prostatic hyperplasia)   . CAD (coronary artery disease)   . Cellulitis   . Cervical spinal stenosis   . Chronic osteomyelitis ([place])   . CKD (chronic kidney disease)   . CKD (chronic kidney disease)   . Clostridium difficile diarrhea   . Depression   . Diplopia   . Fatigue   . GERD (gastroesophageal reflux disease)   . Hyperlipemia   . Hypertension   . Hypovitaminosis D   . IBS (irritable bowel syndrome)   . 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 Insomnia   . Left lumbosacral radiculopathy   . Moderate obstructive sleep apnea   . Osteomyelitis of foot ([place])   . PAD (peripheral artery disease) ([place])   . Pruritus   . RA (rheumatoid arthritis) ([place])   . Radiculopathy   . Restless legs syndrome   . RLS (restless legs syndrome)   . SLE (systemic lupus erythematosus related syndrome) ([place])   . 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 Stroke ([place])   . Toxic maculopathy of both eyes     Past Surgical History:  Procedure Laterality Date  . CARDIAC CATHETERIZATION    . CAROTID ENDARTERECTOMY    .  CERVICAL LAMINECTOMY    . CORONARY ANGIOPLASTY    . FRACTURE SURGERY    . fusion C5-6-7    . HERNIA REPAIR    . KNEE ARTHROSCOPY    . TONSILLECTOMY      There were no vitals filed for this visit.   Subjective Assessment - 04/15/20 1306    Subjective  Pt. reports that he feels good today.    Patient is accompanied by: Family member    Pertinent History Pt. is a 75 y.o. 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 Pt. PMHx includes: Multiple Falls, Lupus, DJD, Renal Abscess, CVA. Pt. resides with his wife. Pt.'s wife and daughter assist with ADLs. Pt. has caregivers in  for 2 hours a day, 6 days a week. Pt. received Rehab services in acute care, at SNF for STR, and [place] services. Pt. is retired from [employer] for [employer] and [employer].    Currently in Pain? Yes    Pain Score 2     Pain Location Wrist    Pain Orientation Left    Pain Descriptors / Indicators Aching          OT TREATMENT   Therapeutic Exercise:  Pt. worked on [place] for reciprocal motion.Pt. performed2#dowelfor UE strengthening secondary to weakness.Pt.tolerated increased dowel weight.Bilateral shoulder flexion, chest press,andcircular patterns, were performedfor1-2 sets10reps each.Pt. worked on3# dumbbell ex. forbilateralelbow flexion and extension,4# for the right.Pt. performedforearm supination/pronationwith 4# for the RUE.Tactile and verbal cues were required for eliciting the desired movement, and correct hand position.  Pt.continuestomakesteadyprogress with BUE strength, and tolerance for there.ex.Pt.continues to require less cues today for movement patterns.Pt.continues to require consistent verbal, visual, and tactile cues.Pt.didrequire cues for radial deviation. Left writs and forearm strengthening withheld today secondary to pt reports for painful flare early this morning upon waking up.Pt. required a towel  roll for lateral support at the left to assume midline position.Pt.continues torequire less cuesto perform UE strengthening exercises, technique, and formas well as visual demonstration prior to the exercises.Pt. continues to work on improving BUE strength, and [facility] skills in order to improve LUE functioning during ADLs, IADLs, and tranfers.                       OT Education - 04/15/20 1307    Education Details strengthening, UE functioning    Person(s) Educated Patient;Spouse    Methods Explanation;Demonstration    Comprehension Returned demonstration;Tactile cues required;Verbalized understanding;Need further instruction               OT Long Term Goals - 03/11/20 1315      OT LONG TERM GOAL #1   Title Pt. will increase left shoulder flexion AROM by 10 degrees to access cabinet/shelf.    Baseline Pt. conitnues to present with limited left shoulder ROM limitng access to cabinets, and shelves.03/01/2020: Left shoulder 116 degrees with new onset of numbness radiating from the left side of the neck to the hand. Pt. continues to progress with left shoulder ROM .Shouler flexion 130, and continues to work on reaching with increasing emphasis on flexing trunk to improve functional reach.  07/21/19- shoulder flexion to 130 with effort    Time 12    Period Weeks    Status On-going    Target Date 05/24/20      OT LONG TERM GOAL #2   Title Pt. will donn a shirt with Supervision.    Baseline Pt. continues to require modA donning a zip down jacket with consistent cues to initiate the correct direction of the jacket.  Min-modA mahaging the zipper. MinA for donning a T-shirt with assist to roll side to side to pull the shirt down over his back. 07/21/19 continues to require min assist at times for shirt/jacket    Time 12    Period Weeks    Status On-going    Target Date 05/24/20      OT LONG TERM GOAL #4   Title Pt. will improve left grip strength by 10# to be able to  open a jar/container.    Baseline Grip strength conitnues to be limited. Decreased left sided grip strength today secondary to a new onset of numbenss, and weakness radiating from the neck to the left hand. Pt. continues to have a harder time opening a wide mouthed jar, however is able to open a smaller jar easier.    Time 12    Period Weeks    Status On-going    Target Date 05/24/20      OT LONG TERM GOAL #5   Title Pt. will improve left hand [facility] skills to be able to assist with buttoning/zipping.    Baseline Pt. conitnues  to work on improving [facility] skills  to improve buttoning , and zipping.03/01/2020: Pt.has had a new onset of weakness, and numbness in the left hand beginning upon waking up this morning which has resulted in a significant change in [facility] skills.  Pt. has improved overall donning a jacket, and  manipulating the zipper on his jacket, however continues to require practice depending on the type of zipper jacket. Pt. continues to work on improving bilateral [facility] skills for buttoning/zipping.  07/21/19 improving with buttons and zippers.    Time 12    Period Weeks    Status On-going    Target Date 05/24/20      OT LONG TERM GOAL #6   Title Pt. will demonstrate visual conmpensatory strategies for 100% of the time during ADLs    Baseline Improving, however Pt. continues to require intermittent cues cues for left sides awareness.    Time 12    Period Weeks    Status On-going      OT LONG TERM GOAL #8   Title Pt. will accurately identify potential safety hazard using good safety awareness, and judgement 100% for ADLs, and IADLs.    Baseline Pt. continues to require cues    Time 12    Period Weeks    Status On-going    Target Date 05/24/20      OT LONG TERM GOAL  #9   TITLE Pt. will  navigate his w/c around obstacles with Supervision and 100% accuracy    Baseline Pt. continues to require cuing for obstacles on the left, however requires less cues overall. Pt. requires significant  cues to engage his left UE during tasks. 07/21/2019 continues to improve with decreased assist at times    Time 12    Period Weeks    Status On-going    Target Date 05/24/20      OT LONG TERM GOAL  #10   TITLE Pt. will independently be able  to reach to place items into cabinetry, and closets.    Baseline Pt. continues to have difficulty with functional reaching conitnues to be limited.    Time 12    Period Weeks    Status On-going    Target Date 05/24/20      OT LONG TERM GOAL  #11   TITLE Pt. will increase BUE strength by 66mm grades in preparation for ADL transfers.    Baseline Pt. BUE strength conitnues to be limited. Pt. is able to tolerate increased resistive weights. Pt. continues to work towards improving overall strength for ADLs.    Time 12    Period Weeks    Status On-going    Target Date 05/24/20      OT LONG TERM GOAL  #12   TITLE Pt. will improve LUE functional reaching with the left engaging the trunk 100% of the time during ADL tasks with minimal cues.    Baseline Left sided numbness has improved. New onset of left sided numbness, and weakness is affecting his ability to perform reaching.    Time 12    Period Weeks    Status On-going    Target Date 05/24/20                 Plan - 04/15/20 1307    Clinical Impression Statement Pt.continuestomakesteadyprogress with BUE strength, and tolerance for there.ex.Pt.continues to require less cues today for movement patterns.Pt.continues to require consistent verbal, visual, and tactile cues.Pt.didrequire cues for radial deviation. Left writs and forearm strengthening withheld  today secondary to pt reports for painful flare early this morning upon waking up.Pt. required a towel roll for lateral support at the left to assume midline position.Pt.continues torequire less cuesto perform UE strengthening exercises, technique, and formas well as visual demonstration prior to the exercises.Pt. continues to work  on improving BUE strength, and [facility] skills in order to improve LUE functioning during ADLs, IADLs, and tranfers.   OT Occupational Profile and History Problem Focused Assessment - Including review of records relating to presenting problem    Occupational performance deficits (Please refer to evaluation for details): ADL's    Body Structure / Function / Physical Skills UE functional use;Coordination;FMC;Dexterity;Strength;ROM    Cognitive Skills Attention;Memory;Emotional;Problem Solve;Safety Awareness    Rehab Potential Good    Clinical Decision Making Several treatment options, min-mod task modification necessary    Comorbidities Affecting Occupational Performance: Presence of comorbidities impacting occupational performance    Modification or Assistance to Complete Evaluation  No modification of tasks or assist necessary to complete eval    OT Frequency 2x / week    OT Duration 12 weeks    OT Treatment/Interventions Self-care/ADL training;DME and/or AE instruction;Therapeutic exercise;Therapeutic activities;Moist Heat;Cognitive remediation/compensation;Neuromuscular education;Visual/perceptual remediation/compensation;Coping strategies training;Patient/family education;Passive range of motion;Psychosocial skills training;Energy conservation;Functional Mobility Training    Consulted and Agree with Plan of Care Patient;Family member/caregiver           Patient will benefit from skilled therapeutic intervention in order to improve the following deficits and impairments:   Body Structure / Function / Physical Skills: UE functional use,Coordination,FMC,Dexterity,Strength,ROM Cognitive Skills: Attention,Memory,Emotional,Problem Solve,Safety Awareness     Visit Diagnosis: Muscle weakness (generalized)    Problem List Patient Active Problem List   Diagnosis Date Noted  . Sepsis ([place]) 10/18/2018    Harrel Carina, MS, OTR/L 04/15/2020, 1:15 PM  [place] MAIN [facility] SERVICES [address] [place], [place], [address] Phone: [phone]   Fax:  [phone]  Name: Kenneth Summers MRN: [id number] Date of Birth: Dec 24, 1945

## 2020-04-15 NOTE — Therapy (Signed)
[place] MAIN [facility] SERVICES [address] [place], [place], [address] Phone: [phone]   Fax:  [phone]  Physical Therapy Treatment  Patient Details  Name: Kenneth Summers MRN: [id number] Date of Birth: 09-04-1945 No data recorded  Encounter Date: 04/15/2020   PT End of Session - 04/15/20 0910    Visit Number [id number]    Number of Visits 252    Date for PT Re-Evaluation 04/19/20    Authorization Type Medicare reporting period starting 09/11/18; NO FOTO    Authorization Time Period goals last assessed on 03/01/2020    PT Start Time 1346    PT Stop Time 1430    PT Time Calculation (min) 44 min    Equipment Utilized During Treatment Gait belt    Activity Tolerance No increased pain;Patient limited by fatigue;Patient tolerated treatment well    Behavior During Therapy [facility] for tasks assessed/performed           Past Medical History:  Diagnosis Date  . Actinic keratosis   . Alcohol abuse   . APS (antiphospholipid syndrome) ([place])   . Basal cell carcinoma   . BPH (benign prostatic hyperplasia)   . CAD (coronary artery disease)   . Cellulitis   . Cervical spinal stenosis   . Chronic osteomyelitis ([place])   . CKD (chronic kidney disease)   . CKD (chronic kidney disease)   . Clostridium difficile diarrhea   . Depression   . Diplopia   . Fatigue   . GERD (gastroesophageal reflux disease)   . Hyperlipemia   . Hypertension   . Hypovitaminosis D   . IBS (irritable bowel syndrome)   . 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 Insomnia   . Left lumbosacral radiculopathy   . Moderate obstructive sleep apnea   . Osteomyelitis of foot ([place])   . PAD (peripheral artery disease) ([place])   . Pruritus   . RA (rheumatoid arthritis) ([place])   . Radiculopathy   . Restless legs syndrome   . RLS (restless legs syndrome)   . SLE (systemic lupus erythematosus related syndrome) ([place])   . Squamous cell carcinoma of skin 01/27/2019   right mid lower ear helix  . Stroke  ([place])   . Toxic maculopathy of both eyes     Past Surgical History:  Procedure Laterality Date  . CARDIAC CATHETERIZATION    . CAROTID ENDARTERECTOMY    . CERVICAL LAMINECTOMY    . CORONARY ANGIOPLASTY    . FRACTURE SURGERY    . fusion C5-6-7    . HERNIA REPAIR    . KNEE ARTHROSCOPY    . TONSILLECTOMY      There were no vitals filed for this visit.   Subjective Assessment - 04/15/20 0909    Subjective Patient reports having another good day stating he moved his arm well in OT and not too tired for PT today.    Patient is accompained by: Family member    Pertinent History Patient is a 75 y.o. male who presents to outpatient physical therapy with a referral for medical diagnosis of CVA. This patient's chief complaints consist of left hemiplegia and overall deconditioning leading to the following functional deficits: dependent for ADLs, IADLs, unable to transfer to car, dependent transfers at home with hoyer lift, unable to walk or stand without significant assistance, difficulty with W/C navigation..    Limitations Sitting;Lifting;Standing;House hold activities;Writing;Walking;Other (comment)    Diagnostic tests Brain MRI 11/01/2017: IMPRESSION: 1. Acute right ACA territory infarct as demonstrated  on prior CT imaging. No intracranial hemorrhage or significant mass effect. 2. 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    Patient Stated Goals wants to be able to walk again    Currently in Pain? No/denies    Pain Onset Today    Pain Onset Today           Treatment  Max assist to stand from 24 in heigh of power chair- using BUE on parallel bars to pull up *FROM THE SIDE OF //Bars today.   trials of static standing:  1) 1 min 00 sec- Less VC to stand up straight- patient able to look at mirror for feedback and responded well to just VC to stand up straight.  2) 50  Sec- stopped secondary to BLE knee weakness 3)  1 min 10 sec- Improved LE weight  bearing and mod/max assist for trunk ext.  4) 35  Sec - more difficulty with left LE weakness/fatigue- Unable to terminally extend knees.  5)  46 sec (assist to keep left foot from increased abducted)  6) 48 sec (more difficulty maintaining erect posture despite mod-max A)  Mod A to Max A with all standing today- no blocking left knee but did require physical assist to keep left foot from abducting. Patient uanble to ext left knee but was able to stand with fairly erected standing posture with VCs and mod assist.    Pt educated throughout session about proper posture and technique with exercises. Improved exercise technique, movement at target joints, use of target muscles after min to mod verbal, visual, tactile cues.                            PT Education - 04/16/20 0910    Education Details Postural cues for standing    Person(s) Educated Patient    Methods Explanation;Demonstration;Tactile cues;Verbal cues    Comprehension Verbalized understanding;Returned demonstration;Verbal cues required;Tactile cues required;Need further instruction            PT Short Term Goals - 01/26/20 1308      PT SHORT TERM GOAL #1   Title Be independent with initial home exercise program for self-management of symptoms.    Baseline HEP to be given at second session; advice to practice unsupported sitting at home (06/19/2018); 07/08/18: Performing at home, 6/29: needs assistance but is doing exercise program regularly; 08/22/18: adherent;    Time 2    Period Weeks    Status Achieved    Target Date 06/26/18             PT Long Term Goals - 03/31/20 1343      PT LONG TERM GOAL #1   Title Patient will complete all bed mobility with min A to improve functional independence for getting in and out of bed and adjusting in bed.     Baseline max A - total A reported by family (06/19/2018); 07/08/18: still requires maxA, dtr reports improvement in rolling since starting therapy; 6/23: min  A to supervision in clinic; not doing at home right; 8/5: Pt's daughter states that his rolling has improved, but still has difficulty with all other bed mobility; 10/23/18: pts daughter states that she is doing about 35% of the work if he is in proper position, he does uses the bed rails to help roll;  using Hoyer for coming to sit EOB; 10/12: pts daughter states that she is doing about 30%  of the work if he is in proper position, he does uses the bed rails to help roll, but it is improving;  using Harrel Lemon for coming to sit EOB, 10/29: min A for sit to sidelying, rolling supervision, able to transition sidelying to sit with HHA; 12/21: min-mod A. 03/01/2020- Patient presents improving bed mobility with current ability to roll to left with CGA and verbal and tactile cues and min assist to roll to right as seen by recent visits. 03/31/2020- Patient and caregiver report that he has maintained ability to roll side to side with only minimal assistance.    Time 12    Period Weeks    Status Achieved    Target Date 04/26/20      PT LONG TERM GOAL #2   Title Patient will complete sit <> stand transfer chair to chair with Min A and LRAD to improve functional independence for household and community mobility.     Baseline min A for STS, still requires elevated surface height to 27".  03/01/20- Patient currently unable to perform a chair to chair transfer and continues to be transferred using hoyer lift. Patient has been training with sit to stand at parallel bars and able to currently perform sit to stand with max assist  from 27in. high surface.03/31/2020- Patient remains at [facility] assist with sit to stand transfer and continued use of hoyer lift transfer for all chair to chair or bed to chair transfers.    Time 12    Period Weeks    Status On-going    Target Date 04/26/20      PT LONG TERM GOAL #3   Title Patient will navigate power w/c with min A x 100 feet to improve mobility for household and short community  distances.    Baseline required total A (06/12/2018); able to roll 20 feet with min A (06/20/2018); 07/08/18: Pt can go approximately 10' without assist per family;  10/23/2018: pt's daughter states that his power WC mobility has become 100% better with the new hand control, slowed speed, and with practice; 10/12: pt's daughter reports that he can perform household WC mobility and limited community ambulation with minA    Time 12    Period Weeks    Status Achieved    Target Date 04/26/20      PT LONG TERM GOAL #4   Title Patient will complete car transfers W/C to family SUV with min A using LRAD to improve his abilty to participate in community activities.     Baseline unable to complete car transfers at all (06/12/2018); unable to complete car transfers at all (06/19/2018).; 07/08/18: Unable to perform at this time; 09/11/18: unable to perform at this time; 10/23/2018: unable to complete car transfers at all; 10/12:  unable to complete car transfers at all, 10/29: unable; 01/13/19: unable; 2/11: min-mod A +2, 3/10: min A+1, 12/21: mod A. 03/01/2020- Patient unable to complete car transfers and continues to require medical transportation at this time.    Time 12    Period Weeks      PT LONG TERM GOAL #5   Title Patient will ambulate at least 10 feet with min A using LRAD to improve mobility for household and community distances.    Baseline unable to take steps (06/12/2018); able to shift R leg forward and back with max A and RW at edge of plinth (06/19/2018); 07/08/18: unable to ambulate at this time. 09/11/18: unable to perform at this time; 10/23/2018: unable to perform at  this time; 10/12: unable to perform at this time, 10/29: unable at this time; 01/13/19: unable at this time, 2/11: min A to step with RLE, unable to step with LLE, 3/10: able to take 2 steps in parallel bars with min A for safety; 4/1: min A for walking 10 feet in parallel bars, 5/24: min A walking 10 feet in parallel bars; 6/21: no change 7/14: no  change. 03/01/2020- Patient unable to ambulate and currently focusing on static standing. He was able to take a forward step with his right LE last week but unable today. 03/31/2020- Patient continues to be non-ambulatory but has been able to move right LE forward with standing but unable to take a step with left LE.    Time 12    Period Weeks    Status Deferred    Target Date 04/26/20      PT LONG TERM GOAL #6   Title Patient will be able to safety navigate up/down ramp with power chair, exhibiting good safety awareness, mod I to safely enter/exit his home.    Baseline 2/11: supervision with intermittent min A;4/1: supervision; 5/24: supervision. 03/01/2020- patient reports that he is able to navigate down the ramp at home to leave the home but continues to need assist to navigate up the ramp into the home.    Time 12    Period Weeks    Status Partially Met    Target Date 04/26/20      PT LONG TERM GOAL #7   Title Patient will increase functional reach test to >15 inches in sitting to exhibit improved sitting balance and positioning and reduce fall risk;    Baseline 07/30/18: 12 inches; 09/11/18: 10-12 inches; 10/23/18: 12-13 inch; 10/12: 14.5", 10/29: 15.5 inch    Time 4    Period Weeks    Status Achieved      PT LONG TERM GOAL #8   Title Patient will increase LLE quad strength to 3+/5 to improve functional strength for standing and mobility;    Baseline 10/28: 2+/5; 01/13/19: 2+/5, 2/11: 3-/5, 04/16/19: 3-/5, 05/08/19: 3-/5, 5/24: 3-/5, 6/21: 3-/5, 7/14: 3-/5, 12/21: 3-/5. 03/01/2020- Left quad= 3-/5 (focusing on quad strengthening during visits and with home program). 03/31/2020- left quad = 3-/5 with MMT.    Time 12    Period Weeks    Status On-going      PT LONG TERM GOAL  #9   TITLE Patient will report no vertigo with provoking motions or positions in order to be able to perform ADLs and mobility tasks without symptoms.    Baseline 06/30/19: reports no dizziness in last week; 6/21: one  episode this weekend; 7/14: experiencing one a week; 03/01/2020.- Patient has denied any dizziness for over past 3 weeks.    Time 12    Period Weeks    Status Achieved      PT LONG TERM GOAL  #10   TITLE Patient will demonstrate improved functional LE/Postural strength as seen by ability to perform static standing for 2 min or greater with max assist  to assist with strength required for ADL's and transfers/pregait/gait activities.    Baseline 03/01/2020- Patient able to stand approx 40 sec today with max assist in parallel bars. 03/31/2020- Patient able to demo static stand with max A for 81mn 47 sec today.    Time 8    Period Weeks    Target Date 04/26/20  Plan - 04/15/20 1446    Clinical Impression Statement Patient continued to present with improved ability to stand today by pulling up from the bars with UE and demo less fatigue overall with improved LE support. He is still very limited with overall endurance but did stand better this week with more erect posture and weight bearing through his LEs. He will continue to benefit from skilled PT services to improve his functional strength and postural control for improved transfers/standing endurance.    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    Examination-Activity Limitations Bed Mobility;Bend;Sit;Toileting;Stand;Stairs;Lift;Transfers;Squat;Locomotion  Level;Carry;Dressing;Hygiene/Grooming;Continence    Examination-Participation Restrictions Yard Work;Interpersonal Relationship;Community Activity    Stability/Clinical Decision Making Evolving/Moderate complexity    Rehab Potential Fair    PT Frequency 3x / week    PT Duration 12 weeks    PT Treatment/Interventions ADLs/Self Care Home Management;Electrical Stimulation;Moist Heat;Cryotherapy;Gait training;Stair training;Functional mobility training;Therapeutic exercise;Balance training;Neuromuscular re-education;Cognitive remediation;Patient/family education;Orthotic Fit/Training;Wheelchair mobility training;Manual techniques;Passive range of motion;Energy conservation;Joint Manipulations;Canalith Repostioning;Therapeutic activities;Vestibular    PT Next Visit Plan Continue with progressive postural/LE strengthening.    PT Home Exercise Plan no changes    Consulted and Agree with Plan of Care Patient;Family member/caregiver    Family Member Consulted Caregiver- [place]           Patient will benefit from skilled therapeutic intervention in order to improve the following deficits and impairments:  Abnormal gait,Decreased activity tolerance,Decreased cognition,Decreased endurance,Decreased knowledge of use of DME,Decreased range of motion,Decreased skin integrity,Decreased strength,Impaired perceived functional ability,Impaired sensation,Impaired UE functional use,Improper body mechanics,Pain,Cardiopulmonary status limiting activity,Decreased balance,Decreased coordination,Decreased mobility,Difficulty walking,Impaired tone,Postural dysfunction,Dizziness  Visit Diagnosis: Muscle weakness (generalized)  Other lack of coordination  Difficulty in walking, not elsewhere classified  Repeated falls     Problem List Patient Active Problem List   Diagnosis Date Noted  . Sepsis ([place]) 10/18/2018    Lewis Moccasin, PT 04/16/2020, 9:15 AM  [place]  MAIN [facility] SERVICES [address] [place], [place], [address] Phone: [phone]   Fax:  [phone]  Name: MIKO SIRICO MRN: [id number] Date of Birth: 1945-12-10

## 2020-04-19 ENCOUNTER — Encounter: Payer: Self-pay | Admitting: Occupational Therapy

## 2020-04-19 ENCOUNTER — Other Ambulatory Visit: Payer: Self-pay

## 2020-04-19 ENCOUNTER — Ambulatory Visit: Payer: Medicare Other | Admitting: Occupational Therapy

## 2020-04-19 ENCOUNTER — Ambulatory Visit: Payer: Medicare Other

## 2020-04-19 DIAGNOSIS — R296 Repeated falls: Secondary | ICD-10-CM

## 2020-04-19 DIAGNOSIS — R262 Difficulty in walking, not elsewhere classified: Secondary | ICD-10-CM

## 2020-04-19 DIAGNOSIS — M6281 Muscle weakness (generalized): Secondary | ICD-10-CM

## 2020-04-19 DIAGNOSIS — I69354 Hemiplegia and hemiparesis following cerebral infarction affecting left non-dominant side: Secondary | ICD-10-CM | POA: Diagnosis not present

## 2020-04-19 DIAGNOSIS — R278 Other lack of coordination: Secondary | ICD-10-CM

## 2020-04-19 NOTE — Therapy (Signed)
[place] MAIN [facility] SERVICES [address] [place], [place], [address] Phone: [phone]   Fax:  [phone]  Physical Therapy Treatment/Recertification for dates: 04/19/2020- 07/12/2020  Patient Details  Name: Kenneth Summers MRN: [id number] Date of Birth: Apr 26, 1945 No data recorded  Encounter Date: 04/19/2020   PT End of Session - 04/19/20 1257    Visit Number [id number]    Number of Visits 276    Date for PT Re-Evaluation 07/12/20    Authorization Type Medicare reporting period starting 09/11/18; NO FOTO    Authorization Time Period Recert 07/13/3014-0/02/930    PT Start Time 1259    PT Stop Time 1344    PT Time Calculation (min) 45 min    Equipment Utilized During Treatment Gait belt    Activity Tolerance No increased pain;Patient limited by fatigue;Patient tolerated treatment well    Behavior During Therapy [facility] for tasks assessed/performed           Past Medical History:  Diagnosis Date  . Actinic keratosis   . Alcohol abuse   . APS (antiphospholipid syndrome) ([place])   . Basal cell carcinoma   . BPH (benign prostatic hyperplasia)   . CAD (coronary artery disease)   . Cellulitis   . Cervical spinal stenosis   . Chronic osteomyelitis ([place])   . CKD (chronic kidney disease)   . CKD (chronic kidney disease)   . Clostridium difficile diarrhea   . Depression   . Diplopia   . Fatigue   . GERD (gastroesophageal reflux disease)   . Hyperlipemia   . Hypertension   . Hypovitaminosis D   . IBS (irritable bowel syndrome)   . 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 Insomnia   . Left lumbosacral radiculopathy   . Moderate obstructive sleep apnea   . Osteomyelitis of foot ([place])   . PAD (peripheral artery disease) ([place])   . Pruritus   . RA (rheumatoid arthritis) ([place])   . Radiculopathy   . Restless legs syndrome   . RLS (restless legs syndrome)   . SLE (systemic lupus erythematosus related syndrome) ([place])   . Squamous cell carcinoma of skin 01/27/2019    right mid lower ear helix  . Stroke ([place])   . Toxic maculopathy of both eyes     Past Surgical History:  Procedure Laterality Date  . CARDIAC CATHETERIZATION    . CAROTID ENDARTERECTOMY    . CERVICAL LAMINECTOMY    . CORONARY ANGIOPLASTY    . FRACTURE SURGERY    . fusion C5-6-7    . HERNIA REPAIR    . KNEE ARTHROSCOPY    . TONSILLECTOMY      There were no vitals filed for this visit.   Subjective Assessment - 04/19/20 2259    Subjective Patient reports good news in the fact the he be able to purchase a conversion van soon. He states otherwise feeling okay today and ready to work.    Patient is accompained by: Family member    Pertinent History Patient is a 75 y.o. male who presents to outpatient physical therapy with a referral for medical diagnosis of CVA. This patient's chief complaints consist of left hemiplegia and overall deconditioning leading to the following functional deficits: dependent for ADLs, IADLs, unable to transfer to car, dependent transfers at home with hoyer lift, unable to walk or stand without significant assistance, difficulty with W/C navigation..    Limitations Sitting;Lifting;Standing;House hold activities;Writing;Walking;Other (comment)    Diagnostic tests Brain MRI 11/01/2017: IMPRESSION: 1.  Acute right ACA territory infarct as demonstrated on prior CT imaging. No intracranial hemorrhage or significant mass effect. 2. 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    Patient Stated Goals wants to be able to walk again    Currently in Pain? No/denies    Pain Onset Today    Pain Onset Today                Reassess goals today - See STG and LTGs- Added new STG and will continue to progress to focus on established goals as patient has made some progress yet has not achieved his transfer or standing goal.   Static standing today in //bars:  1) 2 min 02 sec with mod/max assist- max VC to maintain upright posture and  blocked left knee  2) 1 min 30 sec  3) 1 min 3 sec  Patient continues to fatigue with increased reps yet did report one of his best stands during the recert assessment:   Pt educated throughout session about proper posture and technique with standing.                       PT Education - 04/19/20 2300    Education Details Postural cues with standing. Reviewed goals for recert and reviewed importance of LE strengthening HEP for max rehab benefit.    Person(s) Educated Patient;Child(ren)    Methods Explanation;Demonstration;Tactile cues;Verbal cues    Comprehension Verbalized understanding;Returned demonstration;Verbal cues required;Tactile cues required;Need further instruction            PT Short Term Goals - 04/19/20 2317      PT SHORT TERM GOAL #1   Title Be independent with initial home exercise program for self-management of symptoms.    Baseline HEP to be given at second session; advice to practice unsupported sitting at home (06/19/2018); 07/08/18: Performing at home, 6/29: needs assistance but is doing exercise program regularly; 08/22/18: adherent;    Time 2    Period Weeks    Status Achieved    Target Date 06/26/18      PT SHORT TERM GOAL #2   Title Patient will perform sit to stand transfer with mod Assist in parallel bars for improved transfer ability with decreased caregiver assist.    Baseline 04/19/2020- Max assist of 1 person    Time 6    Period Weeks    Status New    Target Date 05/31/20             PT Long Term Goals - 04/19/20 1321      PT LONG TERM GOAL #1   Title Patient will complete all bed mobility with min A to improve functional independence for getting in and out of bed and adjusting in bed.     Baseline max A - total A reported by family (06/19/2018); 07/08/18: still requires maxA, dtr reports improvement in rolling since starting therapy; 6/23: min A to supervision in clinic; not doing at home right; 8/5: Pt's daughter states that  his rolling has improved, but still has difficulty with all other bed mobility; 10/23/18: pts daughter states that she is doing about 35% of the work if he is in proper position, he does uses the bed rails to help roll;  using Harrel Lemon for coming to sit EOB; 10/12: pts daughter states that she is doing about 30% of the work if he is in proper position, he does uses the bed rails to help  roll, but it is improving;  using Hoyer for coming to sit EOB, 10/29: min A for sit to sidelying, rolling supervision, able to transition sidelying to sit with HHA; 12/21: min-mod A. 03/01/2020- Patient presents improving bed mobility with current ability to roll to left with CGA and verbal and tactile cues and min assist to roll to right as seen by recent visits. 03/31/2020- Patient and caregiver report that he has maintained ability to roll side to side with only minimal assistance.    Time 12    Period Weeks    Status Achieved      PT LONG TERM GOAL #2   Title Patient will complete sit <> stand transfer chair to chair with Min A and LRAD to improve functional independence for household and community mobility.     Baseline min A for STS, still requires elevated surface height to 27".  03/01/20- Patient currently unable to perform a chair to chair transfer and continues to be transferred using hoyer lift. Patient has been training with sit to stand at parallel bars and able to currently perform sit to stand with max assist  from 27in. high surface.03/31/2020- Patient remains at [facility] assist with sit to stand transfer and continued use of hoyer lift transfer for all chair to chair or bed to chair transfers. 04/19/2020- Patient requires Max assist with sit to stand transfers and requires hoyer for chair to chair transfers.    Time 12    Period Weeks    Status On-going    Target Date 07/12/20      PT LONG TERM GOAL #3   Title Patient will navigate power w/c with min A x 100 feet to improve mobility for household and short  community distances.    Baseline required total A (06/12/2018); able to roll 20 feet with min A (06/20/2018); 07/08/18: Pt can go approximately 10' without assist per family;  10/23/2018: pt's daughter states that his power WC mobility has become 100% better with the new hand control, slowed speed, and with practice; 10/12: pt's daughter reports that he can perform household WC mobility and limited community ambulation with minA    Time 12    Period Weeks    Status Achieved      PT LONG TERM GOAL #4   Title Patient will complete car transfers W/C to family SUV with min A using LRAD to improve his abilty to participate in community activities.     Baseline unable to complete car transfers at all (06/12/2018); unable to complete car transfers at all (06/19/2018).; 07/08/18: Unable to perform at this time; 09/11/18: unable to perform at this time; 10/23/2018: unable to complete car transfers at all; 10/12:  unable to complete car transfers at all, 10/29: unable; 01/13/19: unable; 2/11: min-mod A +2, 3/10: min A+1, 12/21: mod A. 03/01/2020- Patient unable to complete car transfers and continues to require medical transportation at this time. 3/14- Patient reports he may be purchasing a conversion van later this week.    Time 12    Period Weeks    Status Deferred      PT LONG TERM GOAL #5   Title Patient will ambulate at least 10 feet with min A using LRAD to improve mobility for household and community distances.    Baseline unable to take steps (06/12/2018); able to shift R leg forward and back with max A and RW at edge of plinth (06/19/2018); 07/08/18: unable to ambulate at this time. 09/11/18: unable to perform at  this time; 10/23/2018: unable to perform at this time; 10/12: unable to perform at this time, 10/29: unable at this time; 01/13/19: unable at this time, 2/11: min A to step with RLE, unable to step with LLE, 3/10: able to take 2 steps in parallel bars with min A for safety; 4/1: min A for walking 10 feet in parallel  bars, 5/24: min A walking 10 feet in parallel bars; 6/21: no change 7/14: no change. 03/01/2020- Patient unable to ambulate and currently focusing on static standing. He was able to take a forward step with his right LE last week but unable today. 03/31/2020- Patient continues to be non-ambulatory but has been able to move right LE forward with standing but unable to take a step with left LE. 04/19/2020- Goal is still not appropriate as of yet- cont to focus on static and dynamic standing balance/pregait activities.    Time 12    Period Weeks    Status Deferred    Target Date 07/12/20      PT LONG TERM GOAL #6   Title Patient will be able to safety navigate up/down ramp with power chair, exhibiting good safety awareness, mod I to safely enter/exit his home.    Baseline 2/11: supervision with intermittent min A;4/1: supervision; 5/24: supervision. 03/01/2020- patient reports that he is able to navigate down the ramp at home to leave the home but continues to need assist to navigate up the ramp into the home.    Time 12    Period Weeks    Status Partially Met      PT LONG TERM GOAL #7   Title Patient will increase functional reach test to >15 inches in sitting to exhibit improved sitting balance and positioning and reduce fall risk;    Baseline 07/30/18: 12 inches; 09/11/18: 10-12 inches; 10/23/18: 12-13 inch; 10/12: 14.5", 10/29: 15.5 inch    Time 4    Period Weeks    Status Achieved      PT LONG TERM GOAL #8   Title Patient will increase LLE quad strength to 3+/5 to improve functional strength for standing and mobility;    Baseline 10/28: 2+/5; 01/13/19: 2+/5, 2/11: 3-/5, 04/16/19: 3-/5, 05/08/19: 3-/5, 5/24: 3-/5, 6/21: 3-/5, 7/14: 3-/5, 12/21: 3-/5. 03/01/2020- Left quad= 3-/5 (focusing on quad strengthening during visits and with home program). 03/31/2020- left quad = 3-/5 with MMT. 04/19/2020= 3- /5 with left quad strength with manual muscle testing.    Time 12    Period Weeks    Status On-going     Target Date 07/12/20      PT LONG TERM GOAL  #9   TITLE Patient will report no vertigo with provoking motions or positions in order to be able to perform ADLs and mobility tasks without symptoms.    Baseline 06/30/19: reports no dizziness in last week; 6/21: one episode this weekend; 7/14: experiencing one a week; 03/01/2020.- Patient has denied any dizziness for over past 3 weeks.    Time 12    Period Weeks    Status Achieved      PT LONG TERM GOAL  #10   TITLE Patient will demonstrate improved functional LE/Postural strength as seen by ability to perform static standing for 2 min or greater with max assist  to assist with strength required for ADL's and transfers/pregait/gait activities.    Baseline 03/01/2020- Patient able to stand approx 40 sec today with max assist in parallel bars. 03/31/2020- Patient able to demo static stand with  max A for 36min 47 sec today. 04/19/2020- Patient able to demo 2 min today with mod/max assist - yet remains inconsistent.    Time 8    Period Weeks    Status Partially Met    Target Date 07/12/20                 Plan - 04/19/20 2304    Clinical Impression Statement Patient was able to static stand today at 2 min with max assist. During this most recent recertification - treatement have mainly focusing on improving standing endurance to prepare for possible transfer or gait. As of now he has been able to progress overall but slowly with some inconsistently- due to fatigue and residual weakness. Patient continues to have deficits in strength of B UE and LE, inability to walk, and impaired static and dynamic standing balance Patient is making slow functional improvements and he will benefit from continued skilled PT to improve dynamic standing balance, strength, transfers, and reach functional goals. Patient needs several rest periods throughout treatment due to fatigue and weakness. Disucssed decreasing frequency down to 2x/week and patient to focus on HEP with  caregiver on off days. Will plan to recertify patient at 2x/week for 12 weeks to continue to progress his functional strength and mobility.    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    Examination-Activity Limitations Bed Mobility;Bend;Sit;Toileting;Stand;Stairs;Lift;Transfers;Squat;Locomotion Level;Carry;Dressing;Hygiene/Grooming;Continence    Examination-Participation Restrictions Yard Work;Interpersonal Relationship;Community Activity    Stability/Clinical Decision Making Evolving/Moderate complexity    Rehab Potential Fair    PT Frequency 2x / week    PT Duration 12 weeks    PT Treatment/Interventions ADLs/Self Care Home Management;Electrical Stimulation;Moist Heat;Cryotherapy;Gait training;Stair training;Functional mobility training;Therapeutic exercise;Balance training;Neuromuscular re-education;Cognitive remediation;Patient/family education;Orthotic Fit/Training;Wheelchair mobility training;Manual techniques;Passive range of motion;Energy conservation;Joint Manipulations;Canalith Repostioning;Therapeutic activities;Vestibular    PT Next Visit Plan Continue with progressive postural/LE strengthening.    PT Home Exercise Plan no changes    Consulted and Agree with Plan of Care Patient;Family member/caregiver    Family Member Consulted Caregiver- [name] (dtr)           Patient will benefit from skilled therapeutic intervention in order to improve the  following deficits and impairments:  Abnormal gait,Decreased activity tolerance,Decreased cognition,Decreased endurance,Decreased knowledge of use of DME,Decreased range of motion,Decreased skin integrity,Decreased strength,Impaired perceived functional ability,Impaired sensation,Impaired UE functional use,Improper body mechanics,Pain,Cardiopulmonary status limiting activity,Decreased balance,Decreased coordination,Decreased mobility,Difficulty walking,Impaired tone,Postural dysfunction,Dizziness  Visit Diagnosis: Muscle weakness (generalized)  Other lack of coordination  Difficulty in walking, not elsewhere classified  Repeated falls     Problem List Patient Active Problem List   Diagnosis Date Noted  . Sepsis ([place]) 10/18/2018    Lewis Moccasin , PT 04/19/2020, 11:28 PM  [place] MAIN [facility] SERVICES [address] [place], [place], [address] Phone: [phone]   Fax:  [phone]  Name: Kenneth Summers MRN: [id number] Date of Birth: 07-05-1945

## 2020-04-19 NOTE — Therapy (Signed)
[place] [facility] SERVICES [address] [place], [place], [address] Phone: [phone]   Fax:  [phone]  Occupational Therapy Progress Note  Dates of reporting period  03/11/2020  to   04/19/2020  Patient Details  Name: Kenneth Summers MRN: [id number] Date of Birth: 20-Sep-1945 No data recorded  Encounter Date: 04/19/2020   OT End of Session - 04/19/20 1352    Visit Number 180    Number of Visits 205    Date for OT Re-Evaluation 05/24/20    Authorization Type Progress report period starting 03/11/2020    OT Start Time 1345    OT Stop Time 1430    OT Time Calculation (min) 45 min    Activity Tolerance Patient tolerated treatment well    Behavior During Therapy [facility] for tasks assessed/performed           Past Medical History:  Diagnosis Date  . Actinic keratosis   . Alcohol abuse   . APS (antiphospholipid syndrome) ([place])   . Basal cell carcinoma   . BPH (benign prostatic hyperplasia)   . CAD (coronary artery disease)   . Cellulitis   . Cervical spinal stenosis   . Chronic osteomyelitis ([place])   . CKD (chronic kidney disease)   . CKD (chronic kidney disease)   . Clostridium difficile diarrhea   . Depression   . Diplopia   . Fatigue   . GERD (gastroesophageal reflux disease)   . Hyperlipemia   . Hypertension   . Hypovitaminosis D   . IBS (irritable bowel syndrome)   . 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 Insomnia   . Left lumbosacral radiculopathy   . Moderate obstructive sleep apnea   . Osteomyelitis of foot ([place])   . PAD (peripheral artery disease) ([place])   . Pruritus   . RA (rheumatoid arthritis) ([place])   . Radiculopathy   . Restless legs syndrome   . RLS (restless legs syndrome)   . SLE (systemic lupus erythematosus related syndrome) ([place])   . Squamous cell carcinoma of skin 01/27/2019   right mid lower ear helix  . Stroke ([place])   . Toxic maculopathy of both eyes     Past Surgical History:  Procedure Laterality Date  .  CARDIAC CATHETERIZATION    . CAROTID ENDARTERECTOMY    . CERVICAL LAMINECTOMY    . CORONARY ANGIOPLASTY    . FRACTURE SURGERY    . fusion C5-6-7    . HERNIA REPAIR    . KNEE ARTHROSCOPY    . TONSILLECTOMY      There were no vitals filed for this visit.   Subjective Assessment - 04/19/20 1351    Subjective  Pt. reports that he feels good today.    Patient is accompanied by: Family member    Pertinent History Pt. is a 75 y.o. 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 Pt. PMHx includes: Multiple Falls, Lupus, DJD, Renal Abscess, CVA. Pt. resides with his wife. Pt.'s wife and daughter assist with ADLs. Pt. has caregivers in  for 2 hours a day, 6 days a week. Pt. received Rehab services in acute care, at SNF for STR, and [facility] services. Pt. is retired from [employer] for [employer] and [employer].    Currently in Pain? No/denies          OT TREATMENT   Therapeutic Exercise:  Pt. worked on [place] for reciprocal motion.Pt. performed2.5#dowelfor UE strengthening  secondary to weakness.Pt.tolerated increased dowel weight.Bilateral shoulder flexion, chest press,andcircular patterns,were performedfor1-2 sets10-20reps each.Pt. worked on4# dumbbell ex. forright elbow flexion and extension, supination/pronation, wrist extension, and radial deviationwith 4# for the RUE, 3# with the LUE for elbow flexion, extension, and supination/pronation.Tactile and verbal cues were required for eliciting the desired movement, and correct hand position.  Pt. Reports that his left wrist feels better today. Pt.continuestomakesteadyprogress with BUE strength, and tolerance for there.ex.Pt.continues torequireless cues today for movement patterns.Pt.continues to require consistent verbal, visual, and tactile cues.Pt.didrequire cues for radial deviation. Pt. required a towel roll for lateral support at the left to assume  midline position.Pt.continues torequire less cuesto perform UE strengthening exercises, technique, and formas well as visual demonstration prior to the exercises.Pt. continues to work on improving BUE strength, and [facility] skills in order to improve LUE functioning during ADLs, IADLs, and tranfers.                        OT Education - 04/19/20 1352    Education Details strengthening, UE functioning    Person(s) Educated Patient;Spouse    Methods Explanation;Demonstration    Comprehension Returned demonstration;Tactile cues required;Verbalized understanding;Need further instruction               OT Long Term Goals - 04/19/20 1353      OT LONG TERM GOAL #1   Title Pt. will increase left shoulder flexion AROM by 10 degrees to access cabinet/shelf.    Baseline Pt. continues to present with limited left shoulder ROM limitng access to cabinets, and shelves.03/01/2020: Left shoulder 116 degrees with new onset of numbness radiating from the left side of the neck to the hand. Pt. continues to progress with left shoulder ROM .Shouler flexion 130, and continues to work on reaching with increasing emphasis on flexing trunk to improve functional reach.  07/21/19- shoulder flexion to 130 with effort    Time 12    Period Weeks    Status On-going    Target Date 05/24/20      OT LONG TERM GOAL #2   Title Pt. will donn a shirt with Supervision.    Baseline Pt. continues to require modA donning a zip down jacket with consistent cues to initiate the correct direction of the jacket.  Min-modA mahaging the zipper. MinA for donning a T-shirt with assist to roll side to side to pull the shirt down over his back. 07/21/19 continues to require min assist at times for shirt/jacket    Time 12    Period Weeks    Status On-going    Target Date 05/24/20      OT LONG TERM GOAL #4   Title Pt. will improve left grip strength by 10# to be able to open a jar/container.    Baseline Grip  strength conitnues to be limited. Decreased left sided grip strength today secondary to a new onset of numbenss, and weakness radiating from the neck to the left hand. Pt. continues to have a harder time opening a wide mouthed jar, however is able to open a smaller jar easier.    Time 12    Period Weeks    Status On-going    Target Date 05/24/20      OT LONG TERM GOAL #5   Title Pt. will improve left hand [facility] skills to be able to assist with buttoning/zipping.    Baseline Pt. conitnues to work on improving [facility] skills  to improve buttoning , and zipping.03/01/2020: Pt.has  had a new onset of weakness, and numbness in the left hand beginning upon waking up this morning which has resulted in a significant change in [facility] skills.  Pt. has improved overall donning a jacket, and  manipulating the zipper on his jacket, however continues to require practice depending on the type of zipper jacket. Pt. continues to work on improving bilateral [facility] skills for buttoning/zipping.  07/21/19 improving with buttons and zippers.    Time 12    Period Weeks    Status On-going    Target Date 05/24/20      OT LONG TERM GOAL #6   Title Pt. will demonstrate visual conmpensatory strategies for 100% of the time during ADLs    Baseline Improving, however Pt. continues to require intermittent cues cues for left sides awareness.    Time 12    Period Weeks    Status On-going    Target Date 05/24/20      OT LONG TERM GOAL #8   Title Pt. will accurately identify potential safety hazard using good safety awareness, and judgement 100% for ADLs, and IADLs.    Baseline Pt. continues to require cues    Time 12    Period Weeks    Status On-going    Target Date 05/24/20      OT LONG TERM GOAL  #9   TITLE Pt. will  navigate his w/c around obstacles with Supervision and 100% accuracy    Baseline Pt. has progressed, however, continues to require cuing for obstacles on the left with fewer  cues overall. Pt. requires significant  cues to engage his left UE during tasks. 07/21/2019 continues to improve with decreased assist at times    Time 12    Period Weeks    Status On-going    Target Date 05/24/20      OT LONG TERM GOAL  #10   TITLE Pt. will independently be able  to reach to place items into cabinetry, and closets.    Baseline Pt. continues to have difficulty with functional reaching continues to be limited.    Time 12    Period Weeks    Status On-going    Target Date 05/24/20      OT LONG TERM GOAL  #11   TITLE Pt. will increase BUE strength by 35mm grades in preparation for ADL transfers.    Baseline Pt. is progressing, however BUE strength continues to be limited. Pt. is able to tolerate increased resistive weights. Pt. continues to work towards improving overall strength for ADLs.    Time 12    Period Weeks    Status On-going    Target Date 06/03/20      OT LONG TERM GOAL  #12   TITLE Pt. will improve LUE functional reaching with the left engaging the trunk 100% of the time during ADL tasks with minimal cues.    Baseline Left sided numbness has improved. New onset of left sided numbness, and weakness is affecting his ability to perform reaching.    Time 12    Period Weeks    Status On-going    Target Date 05/24/20                 Plan - 04/19/20 1353    Clinical Impression Statement Pt. Reports that his left wrist feels better today. Pt.continuestomakesteadyprogress with BUE strength, and tolerance for there.ex.Pt.continues torequireless cues today for movement patterns.Pt.continues to require consistent verbal, visual, and tactile cues.Pt.didrequire cues for radial deviation. Pt.  required a towel roll for lateral support at the left to assume midline position.Pt.continues torequire less cuesto perform UE strengthening exercises, technique, and formas well as visual demonstration prior to the exercises.Pt. continues to work on improving BUE strength, and [facility] skills in order  to improve LUE functioning during ADLs, IADLs, and tranfers.    OT Occupational Profile and History Problem Focused Assessment - Including review of records relating to presenting problem    Occupational performance deficits (Please refer to evaluation for details): ADL's    Body Structure / Function / Physical Skills UE functional use;Coordination;FMC;Dexterity;Strength;ROM    Cognitive Skills Attention;Memory;Emotional;Problem Solve;Safety Awareness    Rehab Potential Good    Clinical Decision Making Several treatment options, min-mod task modification necessary    Comorbidities Affecting Occupational Performance: Presence of comorbidities impacting occupational performance    Comorbidities impacting occupational performance description: Phyical, cognitive, visual,  medical comorbidities    Modification or Assistance to Complete Evaluation  No modification of tasks or assist necessary to complete eval    OT Frequency 2x / week    OT Duration 12 weeks    OT Treatment/Interventions Self-care/ADL training;DME and/or AE instruction;Therapeutic exercise;Therapeutic activities;Moist Heat;Cognitive remediation/compensation;Neuromuscular education;Visual/perceptual remediation/compensation;Coping strategies training;Patient/family education;Passive range of motion;Psychosocial skills training;Energy conservation;Functional Mobility Training    Consulted and Agree with Plan of Care Patient;Family member/caregiver    Family Member Consulted daughter [name]           Patient will benefit from skilled therapeutic intervention in order to improve the following deficits and impairments:   Body Structure / Function / Physical Skills: UE functional use,Coordination,FMC,Dexterity,Strength,ROM Cognitive Skills: Attention,Memory,Emotional,Problem Solve,Safety Awareness     Visit Diagnosis: Muscle weakness (generalized)    Problem List Patient Active Problem List   Diagnosis Date Noted  . Sepsis  ([place]) 10/18/2018    Harrel Carina, MS, OTR/L 04/19/2020, 2:02 PM  [place] [facility] SERVICES [address] [place], [place], [address] Phone: [phone]   Fax:  [phone]  Name: Kenneth Summers MRN: [id number] Date of Birth: [birth year]

## 2020-04-21 ENCOUNTER — Ambulatory Visit: Payer: Medicare Other

## 2020-04-22 ENCOUNTER — Other Ambulatory Visit: Payer: Self-pay

## 2020-04-22 ENCOUNTER — Encounter: Payer: Self-pay | Admitting: Occupational Therapy

## 2020-04-22 ENCOUNTER — Ambulatory Visit: Payer: Medicare Other | Admitting: Occupational Therapy

## 2020-04-22 ENCOUNTER — Ambulatory Visit: Payer: Medicare Other

## 2020-04-22 DIAGNOSIS — R278 Other lack of coordination: Secondary | ICD-10-CM

## 2020-04-22 DIAGNOSIS — R296 Repeated falls: Secondary | ICD-10-CM

## 2020-04-22 DIAGNOSIS — M6281 Muscle weakness (generalized): Secondary | ICD-10-CM

## 2020-04-22 DIAGNOSIS — I69354 Hemiplegia and hemiparesis following cerebral infarction affecting left non-dominant side: Secondary | ICD-10-CM | POA: Diagnosis not present

## 2020-04-22 DIAGNOSIS — R262 Difficulty in walking, not elsewhere classified: Secondary | ICD-10-CM

## 2020-04-22 NOTE — Therapy (Signed)
[place] MAIN [facility] SERVICES [address] [place], [place], [address] Phone: [phone]   Fax:  [phone]  Physical Therapy Treatment  Patient Details  Name: Kenneth Summers MRN: [id number] Date of Birth: [birth year] No data recorded  Encounter Date: 04/22/2020   PT End of Session - 04/22/20 1736    Visit Number [id number]    Number of Visits 276    Date for PT Re-Evaluation 07/12/20    Authorization Type Medicare reporting period starting 09/11/18; NO FOTO    Authorization Time Period Recert 09/15/1749-0/03/5850    PT Start Time 1346    PT Stop Time 1425    PT Time Calculation (min) 39 min    Equipment Utilized During Treatment Gait belt    Activity Tolerance No increased pain;Patient limited by fatigue;Patient tolerated treatment well    Behavior During Therapy [facility] for tasks assessed/performed           Past Medical History:  Diagnosis Date  . Actinic keratosis   . Alcohol abuse   . APS (antiphospholipid syndrome) ([place])   . Basal cell carcinoma   . BPH (benign prostatic hyperplasia)   . CAD (coronary artery disease)   . Cellulitis   . Cervical spinal stenosis   . Chronic osteomyelitis ([place])   . CKD (chronic kidney disease)   . CKD (chronic kidney disease)   . Clostridium difficile diarrhea   . Depression   . Diplopia   . Fatigue   . GERD (gastroesophageal reflux disease)   . Hyperlipemia   . Hypertension   . Hypovitaminosis D   . IBS (irritable bowel syndrome)   . 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 Insomnia   . Left lumbosacral radiculopathy   . Moderate obstructive sleep apnea   . Osteomyelitis of foot ([place])   . PAD (peripheral artery disease) ([place])   . Pruritus   . RA (rheumatoid arthritis) ([place])   . Radiculopathy   . Restless legs syndrome   . RLS (restless legs syndrome)   . SLE (systemic lupus erythematosus related syndrome) ([place])   . Squamous cell carcinoma of skin 01/27/2019   right mid lower ear helix  . Stroke ([place])    . Toxic maculopathy of both eyes     Past Surgical History:  Procedure Laterality Date  . CARDIAC CATHETERIZATION    . CAROTID ENDARTERECTOMY    . CERVICAL LAMINECTOMY    . CORONARY ANGIOPLASTY    . FRACTURE SURGERY    . fusion C5-6-7    . HERNIA REPAIR    . KNEE ARTHROSCOPY    . TONSILLECTOMY      There were no vitals filed for this visit.   Subjective Assessment - 04/22/20 1441    Subjective Patient reports feeling okay just tired today. Reports excited to possibly going to pick up his new conversion [employer] this weekend.    Patient is accompained by: Family member    Pertinent History Patient is a 75 y.o. male who presents to outpatient physical therapy with a referral for medical diagnosis of CVA. This patient's chief complaints consist of left hemiplegia and overall deconditioning leading to the following functional deficits: dependent for ADLs, IADLs, unable to transfer to car, dependent transfers at home with hoyer lift, unable to walk or stand without significant assistance, difficulty with W/C navigation..    Limitations Sitting;Lifting;Standing;House hold activities;Writing;Walking;Other (comment)    Diagnostic tests Brain MRI 11/01/2017: IMPRESSION: 1. Acute right ACA territory infarct as demonstrated on prior CT  imaging. No intracranial hemorrhage or significant mass effect. 2. 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    Patient Stated Goals wants to be able to walk again    Currently in Pain? No/denies    Pain Onset Today    Pain Onset Today            Treatment  Max assist to stand from 24 in heigh of power chair- using BUE on parallel bars to pull up *FROM THE SIDE OF //Bars today.   trials of static standing:  1) 1 min 33 sec- Max Assist to stand and assist with maintaining upright posture.  2) 1 min 07  Sec- Max Assist to remain  standing.  3) 36 sec- Patient moved his right foot out front losing his balance and physical max  assist to sit safely.  4) 22  Sec - more difficulty with left LE weakness/fatigue- Unable to terminally extend knees.   Max A with all standing today- Patient continues to exhibit difficulty standing erect despite max VC and physical assist.   AROM: Left hip flex 2 sets of 10 reps with improved ROM and then performed kick the cone activity with minimal ability to kick left Knee but able to achieve partial ROM to kick the cone 6" in front and diagonal today.     Pt educated throughout session about proper posture and technique with exercises. Improved exercise technique, movement at target joints, use of target muscles after min to mod verbal, visual, tactile cues.                                   PT Education - 04/22/20 1734    Education Details Postural and exercise cues for best performance with therex and standing.    Person(s) Educated Patient    Methods Explanation;Demonstration;Tactile cues;Verbal cues    Comprehension Verbalized understanding;Verbal cues required;Need further instruction;Returned demonstration;Tactile cues required            PT Short Term Goals - 04/19/20 2317      PT SHORT TERM GOAL #1   Title Be independent with initial home exercise program for self-management of symptoms.    Baseline HEP to be given at second session; advice to practice unsupported sitting at home (06/19/2018); 07/08/18: Performing at home, 6/29: needs assistance but is doing exercise program regularly; 08/22/18: adherent;    Time 2    Period Weeks    Status Achieved    Target Date 06/26/18      PT SHORT TERM GOAL #2   Title Patient will perform sit to stand transfer with mod Assist in parallel bars for improved transfer ability with decreased caregiver assist.    Baseline 04/19/2020- Max assist of 1 person    Time 6    Period Weeks    Status New    Target Date 05/31/20             PT Long Term Goals - 04/19/20 1321      PT LONG TERM GOAL #1   Title  Patient will complete all bed mobility with min A to improve functional independence for getting in and out of bed and adjusting in bed.     Baseline max A - total A reported by family (06/19/2018); 07/08/18: still requires maxA, dtr reports improvement in rolling since starting therapy; 6/23: min A to supervision in clinic; not doing at home  right; 8/5: Pt's daughter states that his rolling has improved, but still has difficulty with all other bed mobility; 10/23/18: pts daughter states that she is doing about 35% of the work if he is in proper position, he does uses the bed rails to help roll;  using Harrel Lemon for coming to sit EOB; 10/12: pts daughter states that she is doing about 30% of the work if he is in proper position, he does uses the bed rails to help roll, but it is improving;  using Harrel Lemon for coming to sit EOB, 10/29: min A for sit to sidelying, rolling supervision, able to transition sidelying to sit with HHA; 12/21: min-mod A. 03/01/2020- Patient presents improving bed mobility with current ability to roll to left with CGA and verbal and tactile cues and min assist to roll to right as seen by recent visits. 03/31/2020- Patient and caregiver report that he has maintained ability to roll side to side with only minimal assistance.    Time 12    Period Weeks    Status Achieved      PT LONG TERM GOAL #2   Title Patient will complete sit <> stand transfer chair to chair with Min A and LRAD to improve functional independence for household and community mobility.     Baseline min A for STS, still requires elevated surface height to 27".  03/01/20- Patient currently unable to perform a chair to chair transfer and continues to be transferred using hoyer lift. Patient has been training with sit to stand at parallel bars and able to currently perform sit to stand with max assist  from 27in. high surface.03/31/2020- Patient remains at [facility] assist with sit to stand transfer and continued use of hoyer lift transfer  for all chair to chair or bed to chair transfers. 04/19/2020- Patient requires Max assist with sit to stand transfers and requires hoyer for chair to chair transfers.    Time 12    Period Weeks    Status On-going    Target Date 07/12/20      PT LONG TERM GOAL #3   Title Patient will navigate power w/c with min A x 100 feet to improve mobility for household and short community distances.    Baseline required total A (06/12/2018); able to roll 20 feet with min A (06/20/2018); 07/08/18: Pt can go approximately 10' without assist per family;  10/23/2018: pt's daughter states that his power WC mobility has become 100% better with the new hand control, slowed speed, and with practice; 10/12: pt's daughter reports that he can perform household WC mobility and limited community ambulation with minA    Time 12    Period Weeks    Status Achieved      PT LONG TERM GOAL #4   Title Patient will complete car transfers W/C to family SUV with min A using LRAD to improve his abilty to participate in community activities.     Baseline unable to complete car transfers at all (06/12/2018); unable to complete car transfers at all (06/19/2018).; 07/08/18: Unable to perform at this time; 09/11/18: unable to perform at this time; 10/23/2018: unable to complete car transfers at all; 10/12:  unable to complete car transfers at all, 10/29: unable; 01/13/19: unable; 2/11: min-mod A +2, 3/10: min A+1, 12/21: mod A. 03/01/2020- Patient unable to complete car transfers and continues to require medical transportation at this time. 3/14- Patient reports he may be purchasing a conversion van later this week.    Time 12  Period Weeks    Status Deferred      PT LONG TERM GOAL #5   Title Patient will ambulate at least 10 feet with min A using LRAD to improve mobility for household and community distances.    Baseline unable to take steps (06/12/2018); able to shift R leg forward and back with max A and RW at edge of plinth (06/19/2018); 07/08/18:  unable to ambulate at this time. 09/11/18: unable to perform at this time; 10/23/2018: unable to perform at this time; 10/12: unable to perform at this time, 10/29: unable at this time; 01/13/19: unable at this time, 2/11: min A to step with RLE, unable to step with LLE, 3/10: able to take 2 steps in parallel bars with min A for safety; 4/1: min A for walking 10 feet in parallel bars, 5/24: min A walking 10 feet in parallel bars; 6/21: no change 7/14: no change. 03/01/2020- Patient unable to ambulate and currently focusing on static standing. He was able to take a forward step with his right LE last week but unable today. 03/31/2020- Patient continues to be non-ambulatory but has been able to move right LE forward with standing but unable to take a step with left LE. 04/19/2020- Goal is still not appropriate as of yet- cont to focus on static and dynamic standing balance/pregait activities.    Time 12    Period Weeks    Status Deferred    Target Date 07/12/20      PT LONG TERM GOAL #6   Title Patient will be able to safety navigate up/down ramp with power chair, exhibiting good safety awareness, mod I to safely enter/exit his home.    Baseline 2/11: supervision with intermittent min A;4/1: supervision; 5/24: supervision. 03/01/2020- patient reports that he is able to navigate down the ramp at home to leave the home but continues to need assist to navigate up the ramp into the home.    Time 12    Period Weeks    Status Partially Met      PT LONG TERM GOAL #7   Title Patient will increase functional reach test to >15 inches in sitting to exhibit improved sitting balance and positioning and reduce fall risk;    Baseline 07/30/18: 12 inches; 09/11/18: 10-12 inches; 10/23/18: 12-13 inch; 10/12: 14.5", 10/29: 15.5 inch    Time 4    Period Weeks    Status Achieved      PT LONG TERM GOAL #8   Title Patient will increase LLE quad strength to 3+/5 to improve functional strength for standing and mobility;     Baseline 10/28: 2+/5; 01/13/19: 2+/5, 2/11: 3-/5, 04/16/19: 3-/5, 05/08/19: 3-/5, 5/24: 3-/5, 6/21: 3-/5, 7/14: 3-/5, 12/21: 3-/5. 03/01/2020- Left quad= 3-/5 (focusing on quad strengthening during visits and with home program). 03/31/2020- left quad = 3-/5 with MMT. 04/19/2020= 3- /5 with left quad strength with manual muscle testing.    Time 12    Period Weeks    Status On-going    Target Date 07/12/20      PT LONG TERM GOAL  #9   TITLE Patient will report no vertigo with provoking motions or positions in order to be able to perform ADLs and mobility tasks without symptoms.    Baseline 06/30/19: reports no dizziness in last week; 6/21: one episode this weekend; 7/14: experiencing one a week; 03/01/2020.- Patient has denied any dizziness for over past 3 weeks.    Time 12    Period Weeks  Status Achieved      PT LONG TERM GOAL  #10   TITLE Patient will demonstrate improved functional LE/Postural strength as seen by ability to perform static standing for 2 min or greater with max assist  to assist with strength required for ADL's and transfers/pregait/gait activities.    Baseline 03/01/2020- Patient able to stand approx 40 sec today with max assist in parallel bars. 03/31/2020- Patient able to demo static stand with max A for 63mn 47 sec today. 04/19/2020- Patient able to demo 2 min today with mod/max assist - yet remains inconsistent.    Time 8    Period Weeks    Status Partially Met    Target Date 07/12/20                 Plan - 04/22/20 1737    Clinical Impression Statement Patient presented with increased fatigue with standing versus last visit. He continues to require max verbal and tactile cues to stand erect. He did improve with LE strengthening exercises today and able to lift left hip higher and extend knee to kick the cone. He will continue to benefit from skilled PT services to improve his functional strength and postural control for improved transfers/standing endurance.     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    Examination-Activity Limitations Bed Mobility;Bend;Sit;Toileting;Stand;Stairs;Lift;Transfers;Squat;Locomotion Level;Carry;Dressing;Hygiene/Grooming;Continence    Examination-Participation Restrictions Yard Work;Interpersonal Relationship;Community Activity    Stability/Clinical Decision Making Evolving/Moderate complexity    Rehab Potential Fair    PT Frequency 2x / week    PT Duration 12 weeks    PT Treatment/Interventions ADLs/Self Care Home Management;Electrical Stimulation;Moist Heat;Cryotherapy;Gait training;Stair training;Functional mobility training;Therapeutic exercise;Balance training;Neuromuscular re-education;Cognitive remediation;Patient/family education;Orthotic Fit/Training;Wheelchair mobility training;Manual techniques;Passive range of motion;Energy conservation;Joint Manipulations;Canalith Repostioning;Therapeutic activities;Vestibular    PT Next Visit Plan Continue with progressive postural/LE strengthening.    PT Home Exercise Plan no changes    Consulted and Agree with Plan of Care Patient;Family member/caregiver    Family Member Consulted Caregiver- JMarcie Bal(dtr)           Patient will benefit from skilled therapeutic intervention in order to improve the following deficits and impairments:  Abnormal gait,Decreased activity tolerance,Decreased cognition,Decreased endurance,Decreased knowledge of use  of DME,Decreased range of motion,Decreased skin integrity,Decreased strength,Impaired perceived functional ability,Impaired sensation,Impaired UE functional use,Improper body mechanics,Pain,Cardiopulmonary status limiting activity,Decreased balance,Decreased coordination,Decreased mobility,Difficulty walking,Impaired tone,Postural dysfunction,Dizziness  Visit Diagnosis: Muscle weakness (generalized)  Other lack of coordination  Difficulty in walking, not elsewhere classified  Repeated falls     Problem List Patient Active Problem List   Diagnosis Date Noted  . Sepsis ([place] 10/18/2018    JLewis Moccasin PT 04/22/2020, 6:54 PM  [place] [facility] [address] [place] [address]Phone: [phone]  Fax:  [phone] Name: Kenneth BABAMRN: [id number] of Birth: 121-Oct-1947

## 2020-04-22 NOTE — Therapy (Signed)
[place] MAIN [facility] SERVICES [address] [place], [place], [address] Phone: [phone]   Fax:  [phone]  Occupational Therapy Treatment  Patient Details  Name: Kenneth Summers MRN: [id number] Date of Birth: [birth year] No data recorded  Encounter Date: 04/22/2020   OT End of Session - 04/22/20 1314    Visit Number [id number]    Number of Visits 205    Date for OT Re-Evaluation 05/24/20    Authorization Type Progress report period starting 03/11/2020    OT Start Time 1306    OT Stop Time 1345    OT Time Calculation (min) 39 min    Activity Tolerance Patient tolerated treatment well    Behavior During Therapy [facility] for tasks assessed/performed           Past Medical History:  Diagnosis Date  . Actinic keratosis   . Alcohol abuse   . APS (antiphospholipid syndrome) ([place])   . Basal cell carcinoma   . BPH (benign prostatic hyperplasia)   . CAD (coronary artery disease)   . Cellulitis   . Cervical spinal stenosis   . Chronic osteomyelitis ([place])   . CKD (chronic kidney disease)   . CKD (chronic kidney disease)   . Clostridium difficile diarrhea   . Depression   . Diplopia   . Fatigue   . GERD (gastroesophageal reflux disease)   . Hyperlipemia   . Hypertension   . Hypovitaminosis D   . IBS (irritable bowel syndrome)   . 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 Insomnia   . Left lumbosacral radiculopathy   . Moderate obstructive sleep apnea   . Osteomyelitis of foot ([place])   . PAD (peripheral artery disease) ([place])   . Pruritus   . RA (rheumatoid arthritis) ([place])   . Radiculopathy   . Restless legs syndrome   . RLS (restless legs syndrome)   . SLE (systemic lupus erythematosus related syndrome) ([place])   . Squamous cell carcinoma of skin 01/27/2019   right mid lower ear helix  . Stroke ([place])   . Toxic maculopathy of both eyes     Past Surgical History:  Procedure Laterality Date  . CARDIAC CATHETERIZATION    . CAROTID ENDARTERECTOMY    .  CERVICAL LAMINECTOMY    . CORONARY ANGIOPLASTY    . FRACTURE SURGERY    . fusion C5-6-7    . HERNIA REPAIR    . KNEE ARTHROSCOPY    . TONSILLECTOMY      There were no vitals filed for this visit.   Subjective Assessment - 04/22/20 1314    Subjective  Pt. reports that he feels good today.    Patient is accompanied by: Family member    Pertinent History Pt. is a 75 y.o. 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 Pt. PMHx includes: Multiple Falls, Lupus, DJD, Renal Abscess, CVA. Pt. resides with his wife. Pt.'s wife and daughter assist with ADLs. Pt. has caregivers in  for 2 hours a day, 6 days a week. Pt. received Rehab services in acute care, at SNF for STR, and [place] services. Pt. is retired from [employer] for [employer] and [employer].    Currently in Pain? No/denies         OT TREATMENT   Therapeutic Exercise:  Pt. performed3#dowelfor UE strengthening secondary to weakness.Pt.tolerated increased dowel weight.Bilateral shoulder flexion, chest press,andcircular patterns,were performedfor1-2 sets10-20reps each with cues to keep the dowel symmetrical, and level. Pt.  worked on4# dumbbell ex. forright elbow flexion and extension, supination/pronation, wrist extension, and radial deviationwith 4# for the RUE, 3# with the LUE for elbow flexion, extension, and supination/pronation, wrsit extension.Tactile and verbal cues were required for eliciting the desired movement, and correct hand position.  Pt.continuestomakesteadyprogress with BUE strength, and tolerance for there.ex. Pt. was able to tolerate increased dowel weight to 3#.Pt.continues torequireless cues todayfor movement patterns.Pt.continues to require consistent verbal, visual, and tactile cues.Pt.didrequire cues for radial deviation.Pt.continues torequire less cuesto perform UE strengthening exercises, technique, and formas well as visual demonstration  prior to the exercises.Pt. continues to work on improving BUE strength, and [facility] skills in order to improve LUE functioning during ADLs, IADLs, and tranfers.                          OT Education - 04/22/20 1314    Education Details strengthening, UE functioning    Person(s) Educated Patient;Spouse    Methods Explanation;Demonstration    Comprehension Returned demonstration;Tactile cues required;Verbalized understanding;Need further instruction               OT Long Term Goals - 04/19/20 1353      OT LONG TERM GOAL #1   Title Pt. will increase left shoulder flexion AROM by 10 degrees to access cabinet/shelf.    Baseline Pt. continues to present with limited left shoulder ROM limitng access to cabinets, and shelves.03/01/2020: Left shoulder 116 degrees with new onset of numbness radiating from the left side of the neck to the hand. Pt. continues to progress with left shoulder ROM .Shouler flexion 130, and continues to work on reaching with increasing emphasis on flexing trunk to improve functional reach.  07/21/19- shoulder flexion to 130 with effort    Time 12    Period Weeks    Status On-going    Target Date 05/24/20      OT LONG TERM GOAL #2   Title Pt. will donn a shirt with Supervision.    Baseline Pt. continues to require modA donning a zip down jacket with consistent cues to initiate the correct direction of the jacket.  Min-modA mahaging the zipper. MinA for donning a T-shirt with assist to roll side to side to pull the shirt down over his back. 07/21/19 continues to require min assist at times for shirt/jacket    Time 12    Period Weeks    Status On-going    Target Date 05/24/20      OT LONG TERM GOAL #4   Title Pt. will improve left grip strength by 10# to be able to open a jar/container.    Baseline Grip strength conitnues to be limited. Decreased left sided grip strength today secondary to a new onset of numbenss, and weakness radiating from the  neck to the left hand. Pt. continues to have a harder time opening a wide mouthed jar, however is able to open a smaller jar easier.    Time 12    Period Weeks    Status On-going    Target Date 05/24/20      OT LONG TERM GOAL #5   Title Pt. will improve left hand [facility] skills to be able to assist with buttoning/zipping.    Baseline Pt. conitnues to work on improving [facility] skills  to improve buttoning , and zipping.03/01/2020: Pt.has had a new onset of weakness, and numbness in the left hand beginning upon waking up this morning which has resulted in a significant change  in [facility] skills.  Pt. has improved overall donning a jacket, and  manipulating the zipper on his jacket, however continues to require practice depending on the type of zipper jacket. Pt. continues to work on improving bilateral [facility] skills for buttoning/zipping.  07/21/19 improving with buttons and zippers.    Time 12    Period Weeks    Status On-going    Target Date 05/24/20      OT LONG TERM GOAL #6   Title Pt. will demonstrate visual conmpensatory strategies for 100% of the time during ADLs    Baseline Improving, however Pt. continues to require intermittent cues cues for left sides awareness.    Time 12    Period Weeks    Status On-going    Target Date 05/24/20      OT LONG TERM GOAL #8   Title Pt. will accurately identify potential safety hazard using good safety awareness, and judgement 100% for ADLs, and IADLs.    Baseline Pt. continues to require cues    Time 12    Period Weeks    Status On-going    Target Date 05/24/20      OT LONG TERM GOAL  #9   TITLE Pt. will  navigate his w/c around obstacles with Supervision and 100% accuracy    Baseline Pt. has progressed, however, continues to require cuing for obstacles on the left with fewer  cues overall. Pt. requires significant cues to engage his left UE during tasks. 07/21/2019 continues to improve with decreased assist at times    Time 12    Period Weeks    Status  On-going    Target Date 05/24/20      OT LONG TERM GOAL  #10   TITLE Pt. will independently be able  to reach to place items into cabinetry, and closets.    Baseline Pt. continues to have difficulty with functional reaching continues to be limited.    Time 12    Period Weeks    Status On-going    Target Date 05/24/20      OT LONG TERM GOAL  #11   TITLE Pt. will increase BUE strength by 6mm grades in preparation for ADL transfers.    Baseline Pt. is progressing, however BUE strength continues to be limited. Pt. is able to tolerate increased resistive weights. Pt. continues to work towards improving overall strength for ADLs.    Time 12    Period Weeks    Status On-going    Target Date 06/03/20      OT LONG TERM GOAL  #12   TITLE Pt. will improve LUE functional reaching with the left engaging the trunk 100% of the time during ADL tasks with minimal cues.    Baseline Left sided numbness has improved. New onset of left sided numbness, and weakness is affecting his ability to perform reaching.    Time 12    Period Weeks    Status On-going    Target Date 05/24/20                 Plan - 04/22/20 1317    Clinical Impression Statement Pt.continuestomakesteadyprogress with BUE strength, and tolerance for there.ex Pt. Was able to tolerate increased dowel weight to 3#.Pt.continues torequireless cues todayfor movement patterns.Pt.continues to require consistent verbal, visual, and tactile cues.Pt.didrequire cues for radial deviation.Pt.continues torequire less cuesto perform UE strengthening exercises, technique, and formas well as visual demonstration prior to the exercises.Pt. continues to work on improving BUE strength, and  [facility] skills in order to improve LUE functioning during ADLs, IADLs, and tranfers.   OT Occupational Profile and History Problem Focused Assessment - Including review of records relating to presenting problem    Occupational performance deficits  (Please refer to evaluation for details): ADL's    Body Structure / Function / Physical Skills UE functional use;Coordination;FMC;Dexterity;Strength;ROM    Cognitive Skills Attention;Memory;Emotional;Problem Solve;Safety Awareness    Rehab Potential Good    Clinical Decision Making Several treatment options, min-mod task modification necessary    Comorbidities Affecting Occupational Performance: Presence of comorbidities impacting occupational performance    Comorbidities impacting occupational performance description: Phyical, cognitive, visual,  medical comorbidities    Modification or Assistance to Complete Evaluation  No modification of tasks or assist necessary to complete eval    OT Frequency 2x / week    OT Duration 12 weeks    OT Treatment/Interventions Self-care/ADL training;DME and/or AE instruction;Therapeutic exercise;Therapeutic activities;Moist Heat;Cognitive remediation/compensation;Neuromuscular education;Visual/perceptual remediation/compensation;Coping strategies training;Patient/family education;Passive range of motion;Psychosocial skills training;Energy conservation;Functional Mobility Training    Consulted and Agree with Plan of Care Patient;Family member/caregiver           Patient will benefit from skilled therapeutic intervention in order to improve the following deficits and impairments:   Body Structure / Function / Physical Skills: UE functional use,Coordination,FMC,Dexterity,Strength,ROM Cognitive Skills: Attention,Memory,Emotional,Problem Solve,Safety Awareness     Visit Diagnosis: Muscle weakness (generalized)  Other lack of coordination    Problem List Patient Active Problem List   Diagnosis Date Noted  . Sepsis ([place]) 10/18/2018    Harrel Carina, MS, OTR/L 04/22/2020, 1:23 PM  [place] MAIN [facility] SERVICES [address] [place], [place], [address] Phone: [phone]   Fax:  [phone]  Name: Kenneth Summers MRN: [id number] Date of Birth: [birth year]

## 2020-04-26 ENCOUNTER — Ambulatory Visit: Payer: Medicare Other

## 2020-04-26 ENCOUNTER — Ambulatory Visit: Payer: Medicare Other | Admitting: Occupational Therapy

## 2020-04-28 ENCOUNTER — Ambulatory Visit: Payer: Medicare Other | Admitting: Occupational Therapy

## 2020-04-28 ENCOUNTER — Encounter: Payer: Self-pay | Admitting: Occupational Therapy

## 2020-04-28 ENCOUNTER — Ambulatory Visit: Payer: Medicare Other

## 2020-04-28 ENCOUNTER — Other Ambulatory Visit: Payer: Self-pay

## 2020-04-28 DIAGNOSIS — M6281 Muscle weakness (generalized): Secondary | ICD-10-CM

## 2020-04-28 DIAGNOSIS — R296 Repeated falls: Secondary | ICD-10-CM

## 2020-04-28 DIAGNOSIS — R262 Difficulty in walking, not elsewhere classified: Secondary | ICD-10-CM

## 2020-04-28 DIAGNOSIS — I69354 Hemiplegia and hemiparesis following cerebral infarction affecting left non-dominant side: Secondary | ICD-10-CM | POA: Diagnosis not present

## 2020-04-28 DIAGNOSIS — R278 Other lack of coordination: Secondary | ICD-10-CM

## 2020-04-28 DIAGNOSIS — R41841 Cognitive communication deficit: Secondary | ICD-10-CM

## 2020-04-28 DIAGNOSIS — H547 Unspecified visual loss: Secondary | ICD-10-CM

## 2020-04-28 NOTE — Therapy (Signed)
[place] MAIN [facility] SERVICES [address] [place], [place], [address] Phone: [phone]   Fax:  [phone]  Occupational Therapy Treatment  Patient Details  Name: Kenneth Summers MRN: [id number] Date of Birth: Jul 17, 1945 No data recorded  Encounter Date: 04/28/2020   OT End of Session - 04/28/20 1951    Visit Number [id number]    Number of Visits 205    Date for OT Re-Evaluation 05/24/20    Authorization Type Progress report period starting 03/11/2020    OT Start Time 1345    OT Stop Time 1430    OT Time Calculation (min) 45 min    Activity Tolerance Patient tolerated treatment well    Behavior During Therapy [facility] for tasks assessed/performed           Past Medical History:  Diagnosis Date  . Actinic keratosis   . Alcohol abuse   . APS (antiphospholipid syndrome) ([place])   . Basal cell carcinoma   . BPH (benign prostatic hyperplasia)   . CAD (coronary artery disease)   . Cellulitis   . Cervical spinal stenosis   . Chronic osteomyelitis ([place])   . CKD (chronic kidney disease)   . CKD (chronic kidney disease)   . Clostridium difficile diarrhea   . Depression   . Diplopia   . Fatigue   . GERD (gastroesophageal reflux disease)   . Hyperlipemia   . Hypertension   . Hypovitaminosis D   . IBS (irritable bowel syndrome)   . 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 Insomnia   . Left lumbosacral radiculopathy   . Moderate obstructive sleep apnea   . Osteomyelitis of foot ([place])   . PAD (peripheral artery disease) ([place])   . Pruritus   . RA (rheumatoid arthritis) ([place])   . Radiculopathy   . Restless legs syndrome   . RLS (restless legs syndrome)   . SLE (systemic lupus erythematosus related syndrome) ([place])   . Squamous cell carcinoma of skin 01/27/2019   right mid lower ear helix  . Stroke ([place])   . Toxic maculopathy of both eyes     Past Surgical History:  Procedure Laterality Date  . CARDIAC CATHETERIZATION    . CAROTID ENDARTERECTOMY    .  CERVICAL LAMINECTOMY    . CORONARY ANGIOPLASTY    . FRACTURE SURGERY    . fusion C5-6-7    . HERNIA REPAIR    . KNEE ARTHROSCOPY    . TONSILLECTOMY      There were no vitals filed for this visit.   Subjective Assessment - 04/28/20 1949    Subjective  Pt reports they got a handicapped accessible Lucianne Lei but his chair is too big /tall to fit into it.  They are looking into another chair to use with the Lucianne Lei    Pertinent History Pt. is a 75 y.o. 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 Pt. PMHx includes: Multiple Falls, Lupus, DJD, Renal Abscess, CVA. Pt. resides with his wife. Pt.'s wife and daughter assist with ADLs. Pt. has caregivers in  for 2 hours a day, 6 days a week. Pt. received Rehab services in acute care, at SNF for STR, and [place] services. Pt. is retired from [employer] for [employer] and [employer].    Patient Stated Goals To regain use of his left UE, and do more for himself.    Currently in Pain? No/denies    Pain Score 0-No pain  Patient seen for fine motor coordination skills with use of left UE to pick up small 1/2 inch objects to place into container.  Cues at times for prehension patterns, patient has improved with speed and dexterity of task.  Worked towards manipulation of simulated medication pills small in size to pick up and place into pill bottles.  Manipulation of puzzle pieces from tetris style puzzle following design copy, increased difficulty following pattern, increased distraction in clinic and not focused on design.  Often trying to put pieces together that appear to fit but not attending to directions.    Pt seen for UE strengthening task with use of weight dowel for shoulder flexion, ABD/ADD, chest press, forwards and backwards circles.  Therapist demo of exercises, cues for proper form and technique.    Response to tx: Patient easily distracted in gym area especially when other therapists and patient walk by.   He tends to do best in a quiet space facing the wall to avoid being distracted by others.  Difficulty focusing on pattern design and requires cues from therapist and use of pointer to highlight each piece of the puzzle to place into the grid.  Performs well with repetitions and familiar exercises.  Continue OT towards goals in plan of care to maximize safety and independence in necessary daily tasks at home and in the community.                       OT Education - 04/28/20 1951    Education Details strengthening, UE functioning    Person(s) Educated Patient;Spouse    Methods Explanation;Demonstration    Comprehension Returned demonstration;Tactile cues required;Verbalized understanding;Need further instruction               OT Long Term Goals - 04/19/20 1353      OT LONG TERM GOAL #1   Title Pt. will increase left shoulder flexion AROM by 10 degrees to access cabinet/shelf.    Baseline Pt. continues to present with limited left shoulder ROM limitng access to cabinets, and shelves.03/01/2020: Left shoulder 116 degrees with new onset of numbness radiating from the left side of the neck to the hand. Pt. continues to progress with left shoulder ROM .Shouler flexion 130, and continues to work on reaching with increasing emphasis on flexing trunk to improve functional reach.  07/21/19- shoulder flexion to 130 with effort    Time 12    Period Weeks    Status On-going    Target Date 05/24/20      OT LONG TERM GOAL #2   Title Pt. will donn a shirt with Supervision.    Baseline Pt. continues to require modA donning a zip down jacket with consistent cues to initiate the correct direction of the jacket.  Min-modA mahaging the zipper. MinA for donning a T-shirt with assist to roll side to side to pull the shirt down over his back. 07/21/19 continues to require min assist at times for shirt/jacket    Time 12    Period Weeks    Status On-going    Target Date 05/24/20      OT LONG  TERM GOAL #4   Title Pt. will improve left grip strength by 10# to be able to open a jar/container.    Baseline Grip strength conitnues to be limited. Decreased left sided grip strength today secondary to a new onset of numbenss, and weakness radiating from the neck to the left hand. Pt. continues to have a  harder time opening a wide mouthed jar, however is able to open a smaller jar easier.    Time 12    Period Weeks    Status On-going    Target Date 05/24/20      OT LONG TERM GOAL #5   Title Pt. will improve left hand [facility] skills to be able to assist with buttoning/zipping.    Baseline Pt. conitnues to work on improving [facility] skills  to improve buttoning , and zipping.03/01/2020: Pt.has had a new onset of weakness, and numbness in the left hand beginning upon waking up this morning which has resulted in a significant change in [facility] skills.  Pt. has improved overall donning a jacket, and  manipulating the zipper on his jacket, however continues to require practice depending on the type of zipper jacket. Pt. continues to work on improving bilateral [facility] skills for buttoning/zipping.  07/21/19 improving with buttons and zippers.    Time 12    Period Weeks    Status On-going    Target Date 05/24/20      OT LONG TERM GOAL #6   Title Pt. will demonstrate visual conmpensatory strategies for 100% of the time during ADLs    Baseline Improving, however Pt. continues to require intermittent cues cues for left sides awareness.    Time 12    Period Weeks    Status On-going    Target Date 05/24/20      OT LONG TERM GOAL #8   Title Pt. will accurately identify potential safety hazard using good safety awareness, and judgement 100% for ADLs, and IADLs.    Baseline Pt. continues to require cues    Time 12    Period Weeks    Status On-going    Target Date 05/24/20      OT LONG TERM GOAL  #9   TITLE Pt. will  navigate his w/c around obstacles with Supervision and 100% accuracy    Baseline Pt. has  progressed, however, continues to require cuing for obstacles on the left with fewer  cues overall. Pt. requires significant cues to engage his left UE during tasks. 07/21/2019 continues to improve with decreased assist at times    Time 12    Period Weeks    Status On-going    Target Date 05/24/20      OT LONG TERM GOAL  #10   TITLE Pt. will independently be able  to reach to place items into cabinetry, and closets.    Baseline Pt. continues to have difficulty with functional reaching continues to be limited.    Time 12    Period Weeks    Status On-going    Target Date 05/24/20      OT LONG TERM GOAL  #11   TITLE Pt. will increase BUE strength by 38mm grades in preparation for ADL transfers.    Baseline Pt. is progressing, however BUE strength continues to be limited. Pt. is able to tolerate increased resistive weights. Pt. continues to work towards improving overall strength for ADLs.    Time 12    Period Weeks    Status On-going    Target Date 06/03/20      OT LONG TERM GOAL  #12   TITLE Pt. will improve LUE functional reaching with the left engaging the trunk 100% of the time during ADL tasks with minimal cues.    Baseline Left sided numbness has improved. New onset of left sided numbness, and weakness is affecting his ability to perform  reaching.    Time 12    Period Weeks    Status On-going    Target Date 05/24/20                 Plan - 04/28/20 1951    Clinical Impression Statement Patient easily distracted in gym area especially when other therapists and patient walk by.  He tends to do best in a quiet space facing the wall to avoid being distracted by others.  Difficulty focusing on pattern design and requires cues from therapist and use of pointer to highlight each piece of the puzzle to place into the grid.  Performs well with repetitions and familiar exercises.  Continue OT towards goals in plan of care to maximize safety and independence in necessary daily tasks at  home and in the community.    OT Occupational Profile and History Problem Focused Assessment - Including review of records relating to presenting problem    Occupational performance deficits (Please refer to evaluation for details): ADL's    Body Structure / Function / Physical Skills UE functional use;Coordination;FMC;Dexterity;Strength;ROM    Cognitive Skills Attention;Memory;Emotional;Problem Solve;Safety Awareness    Rehab Potential Good    Clinical Decision Making Several treatment options, min-mod task modification necessary    Comorbidities Affecting Occupational Performance: Presence of comorbidities impacting occupational performance    Comorbidities impacting occupational performance description: Phyical, cognitive, visual,  medical comorbidities    Modification or Assistance to Complete Evaluation  No modification of tasks or assist necessary to complete eval    OT Frequency 2x / week    OT Duration 12 weeks    OT Treatment/Interventions Self-care/ADL training;DME and/or AE instruction;Therapeutic exercise;Therapeutic activities;Moist Heat;Cognitive remediation/compensation;Neuromuscular education;Visual/perceptual remediation/compensation;Coping strategies training;Patient/family education;Passive range of motion;Psychosocial skills training;Energy conservation;Functional Mobility Training    Consulted and Agree with Plan of Care Patient;Family member/caregiver           Patient will benefit from skilled therapeutic intervention in order to improve the following deficits and impairments:   Body Structure / Function / Physical Skills: UE functional use,Coordination,FMC,Dexterity,Strength,ROM Cognitive Skills: Attention,Memory,Emotional,Problem Solve,Safety Awareness     Visit Diagnosis: Muscle weakness (generalized)  Other lack of coordination  Hemiplegia and hemiparesis following cerebral infarction affecting left non-dominant side (HCC)  Cognitive communication  deficit  Vision impairment    Problem List Patient Active Problem List   Diagnosis Date Noted  . Sepsis [facility]) 10/18/2018   Alannis Hsia T Latasia Silberstein, OTR/L, CLT  Erynn Vaca 04/28/2020, 8:03 PM  [place] MAIN [facility] SERVICES [address] [place], [place], [address] Phone: [phone]   Fax:  [phone]  Name: GARION WEMPE MRN: [id number] Date of Birth: 1945/08/17

## 2020-04-28 NOTE — Therapy (Signed)
[place] MAIN [facility] SERVICES [address] [place], [place], [address] Phone: [phone]   Fax:  [phone]  Physical Therapy Treatment/Physical Therapy Progress Note   Dates of reporting period 03/31/2020   to   04/28/2020  Patient Details  Name: Kenneth Summers MRN: [id number] Date of Birth: [birth year] No data recorded  Encounter Date: 04/28/2020   PT End of Session - 04/28/20 1508    Visit Number [id number]    Number of Visits 276    Date for PT Re-Evaluation 07/12/20    Authorization Type Medicare reporting period starting 09/11/18; NO FOTO    Authorization Time Period Recert 06/23/15-05/16/4494    PT Start Time 1300    PT Stop Time 1343    PT Time Calculation (min) 43 min    Equipment Utilized During Treatment Gait belt    Activity Tolerance No increased pain;Patient limited by fatigue;Patient tolerated treatment well    Behavior During Therapy [facility] for tasks assessed/performed           Past Medical History:  Diagnosis Date  . Actinic keratosis   . Alcohol abuse   . APS (antiphospholipid syndrome) ([place])   . Basal cell carcinoma   . BPH (benign prostatic hyperplasia)   . CAD (coronary artery disease)   . Cellulitis   . Cervical spinal stenosis   . Chronic osteomyelitis ([place])   . CKD (chronic kidney disease)   . CKD (chronic kidney disease)   . Clostridium difficile diarrhea   . Depression   . Diplopia   . Fatigue   . GERD (gastroesophageal reflux disease)   . Hyperlipemia   . Hypertension   . Hypovitaminosis D   . IBS (irritable bowel syndrome)   . 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 Insomnia   . Left lumbosacral radiculopathy   . Moderate obstructive sleep apnea   . Osteomyelitis of foot ([place])   . PAD (peripheral artery disease) ([place])   . Pruritus   . RA (rheumatoid arthritis) ([place])   . Radiculopathy   . Restless legs syndrome   . RLS (restless legs syndrome)   . SLE (systemic lupus erythematosus related syndrome) ([place])   .  Squamous cell carcinoma of skin 01/27/2019   right mid lower ear helix  . Stroke ([place])   . Toxic maculopathy of both eyes     Past Surgical History:  Procedure Laterality Date  . CARDIAC CATHETERIZATION    . CAROTID ENDARTERECTOMY    . CERVICAL LAMINECTOMY    . CORONARY ANGIOPLASTY    . FRACTURE SURGERY    . fusion C5-6-7    . HERNIA REPAIR    . KNEE ARTHROSCOPY    . TONSILLECTOMY      There were no vitals filed for this visit.   Subjective Assessment - 04/28/20 1506    Subjective Patient reports doing okay today. States that the conversion Lucianne Lei will not currently accomodate his chair with his height so looking at other chair options.    Patient is accompained by: Family member    Pertinent History Patient is a 75 y.o. male who presents to outpatient physical therapy with a referral for medical diagnosis of CVA. This patient's chief complaints consist of left hemiplegia and overall deconditioning leading to the following functional deficits: dependent for ADLs, IADLs, unable to transfer to car, dependent transfers at home with hoyer lift, unable to walk or stand without significant assistance, difficulty with W/C navigation..    Limitations Sitting;Lifting;Standing;House hold activities;Writing;Walking;Other (comment)  Diagnostic tests Brain MRI 11/01/2017: IMPRESSION: 1. Acute right ACA territory infarct as demonstrated on prior CT imaging. No intracranial hemorrhage or significant mass effect. 2. 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    Patient Stated Goals wants to be able to walk again    Currently in Pain? No/denies    Pain Onset Today    Pain Onset Today             GOALS were just assessed on 03/18/4707- for recertification visit. Assessed sit to stand and standing functional endurance today.   Interventions:   Patient performed all sit to stand transfers with max assist today - he is able to reach forward for //bars and leaning  forward with CGA to min assist. He is able to rock forward on 3 count to stand.   Standing trials 1) 1 min 25 sec- during the last 24 sec- he was able to position more forward and stand more erect - able to decrease support from max A down to min A while continuing blocking left knee.  2) 1 min 7 sec- Difficulty initially obtaining weight over toes yet again experienced longer period of time with less physical assist.  3) 58 sec 4) 47 sec- Patient utilized walker rather than //bars- Increased difficulty positioning forward with more assist to extend trunk 5) 30 sec- using walker   Education provided throughout session via VC/TC and demonstration to facilitate movement at target joints and correct muscle activation for all testing and exercises performed.  Clinical Impression- Patient was able to stand better today- had a few trials today in which he was able to pull himself more forward and extend his hips and required less physical assist. Later in visit he was able to use a walker to stand but required more physical assist to stand. He will continue to benefit from skilled PT services to improve his functional strength and postural control for improved transfers/standing endurance.                      PT Education - 04/28/20 1507    Education Details Postural cues for best standing technique.    Person(s) Educated Patient    Methods Explanation;Demonstration;Tactile cues;Verbal cues    Comprehension Verbalized understanding;Verbal cues required;Tactile cues required;Returned demonstration;Need further instruction            PT Short Term Goals - 04/19/20 2317      PT SHORT TERM GOAL #1   Title Be independent with initial home exercise program for self-management of symptoms.    Baseline HEP to be given at second session; advice to practice unsupported sitting at home (06/19/2018); 07/08/18: Performing at home, 6/29: needs assistance but is doing exercise program  regularly; 08/22/18: adherent;    Time 2    Period Weeks    Status Achieved    Target Date 06/26/18      PT SHORT TERM GOAL #2   Title Patient will perform sit to stand transfer with mod Assist in parallel bars for improved transfer ability with decreased caregiver assist.    Baseline 04/19/2020- Max assist of 1 person    Time 6    Period Weeks    Status New    Target Date 05/31/20             PT Long Term Goals - 04/19/20 1321      PT LONG TERM GOAL #1   Title Patient will complete all bed  mobility with min A to improve functional independence for getting in and out of bed and adjusting in bed.     Baseline max A - total A reported by family (06/19/2018); 07/08/18: still requires maxA, dtr reports improvement in rolling since starting therapy; 6/23: min A to supervision in clinic; not doing at home right; 8/5: Pt's daughter states that his rolling has improved, but still has difficulty with all other bed mobility; 10/23/18: pts daughter states that she is doing about 35% of the work if he is in proper position, he does uses the bed rails to help roll;  using Harrel Lemon for coming to sit EOB; 10/12: pts daughter states that she is doing about 30% of the work if he is in proper position, he does uses the bed rails to help roll, but it is improving;  using Harrel Lemon for coming to sit EOB, 10/29: min A for sit to sidelying, rolling supervision, able to transition sidelying to sit with HHA; 12/21: min-mod A. 03/01/2020- Patient presents improving bed mobility with current ability to roll to left with CGA and verbal and tactile cues and min assist to roll to right as seen by recent visits. 03/31/2020- Patient and caregiver report that he has maintained ability to roll side to side with only minimal assistance.    Time 12    Period Weeks    Status Achieved      PT LONG TERM GOAL #2   Title Patient will complete sit <> stand transfer chair to chair with Min A and LRAD to improve functional independence for  household and community mobility.     Baseline min A for STS, still requires elevated surface height to 27".  03/01/20- Patient currently unable to perform a chair to chair transfer and continues to be transferred using hoyer lift. Patient has been training with sit to stand at parallel bars and able to currently perform sit to stand with max assist  from 27in. high surface.03/31/2020- Patient remains at [facility] assist with sit to stand transfer and continued use of hoyer lift transfer for all chair to chair or bed to chair transfers. 04/19/2020- Patient requires Max assist with sit to stand transfers and requires hoyer for chair to chair transfers.    Time 12    Period Weeks    Status On-going    Target Date 07/12/20      PT LONG TERM GOAL #3   Title Patient will navigate power w/c with min A x 100 feet to improve mobility for household and short community distances.    Baseline required total A (06/12/2018); able to roll 20 feet with min A (06/20/2018); 07/08/18: Pt can go approximately 10' without assist per family;  10/23/2018: pt's daughter states that his power WC mobility has become 100% better with the new hand control, slowed speed, and with practice; 10/12: pt's daughter reports that he can perform household WC mobility and limited community ambulation with minA    Time 12    Period Weeks    Status Achieved      PT LONG TERM GOAL #4   Title Patient will complete car transfers W/C to family SUV with min A using LRAD to improve his abilty to participate in community activities.     Baseline unable to complete car transfers at all (06/12/2018); unable to complete car transfers at all (06/19/2018).; 07/08/18: Unable to perform at this time; 09/11/18: unable to perform at this time; 10/23/2018: unable to complete car transfers at all; 10/12:  unable to complete car transfers at all, 10/29: unable; 01/13/19: unable; 2/11: min-mod A +2, 3/10: min A+1, 12/21: mod A. 03/01/2020- Patient unable to complete car transfers  and continues to require medical transportation at this time. 3/14- Patient reports he may be purchasing a conversion van later this week.    Time 12    Period Weeks    Status Deferred      PT LONG TERM GOAL #5   Title Patient will ambulate at least 10 feet with min A using LRAD to improve mobility for household and community distances.    Baseline unable to take steps (06/12/2018); able to shift R leg forward and back with max A and RW at edge of plinth (06/19/2018); 07/08/18: unable to ambulate at this time. 09/11/18: unable to perform at this time; 10/23/2018: unable to perform at this time; 10/12: unable to perform at this time, 10/29: unable at this time; 01/13/19: unable at this time, 2/11: min A to step with RLE, unable to step with LLE, 3/10: able to take 2 steps in parallel bars with min A for safety; 4/1: min A for walking 10 feet in parallel bars, 5/24: min A walking 10 feet in parallel bars; 6/21: no change 7/14: no change. 03/01/2020- Patient unable to ambulate and currently focusing on static standing. He was able to take a forward step with his right LE last week but unable today. 03/31/2020- Patient continues to be non-ambulatory but has been able to move right LE forward with standing but unable to take a step with left LE. 04/19/2020- Goal is still not appropriate as of yet- cont to focus on static and dynamic standing balance/pregait activities.    Time 12    Period Weeks    Status Deferred    Target Date 07/12/20      PT LONG TERM GOAL #6   Title Patient will be able to safety navigate up/down ramp with power chair, exhibiting good safety awareness, mod I to safely enter/exit his home.    Baseline 2/11: supervision with intermittent min A;4/1: supervision; 5/24: supervision. 03/01/2020- patient reports that he is able to navigate down the ramp at home to leave the home but continues to need assist to navigate up the ramp into the home.    Time 12    Period Weeks    Status Partially Met       PT LONG TERM GOAL #7   Title Patient will increase functional reach test to >15 inches in sitting to exhibit improved sitting balance and positioning and reduce fall risk;    Baseline 07/30/18: 12 inches; 09/11/18: 10-12 inches; 10/23/18: 12-13 inch; 10/12: 14.5", 10/29: 15.5 inch    Time 4    Period Weeks    Status Achieved      PT LONG TERM GOAL #8   Title Patient will increase LLE quad strength to 3+/5 to improve functional strength for standing and mobility;    Baseline 10/28: 2+/5; 01/13/19: 2+/5, 2/11: 3-/5, 04/16/19: 3-/5, 05/08/19: 3-/5, 5/24: 3-/5, 6/21: 3-/5, 7/14: 3-/5, 12/21: 3-/5. 03/01/2020- Left quad= 3-/5 (focusing on quad strengthening during visits and with home program). 03/31/2020- left quad = 3-/5 with MMT. 04/19/2020= 3- /5 with left quad strength with manual muscle testing.    Time 12    Period Weeks    Status On-going    Target Date 07/12/20      PT LONG TERM GOAL  #9   TITLE Patient will report no vertigo with provoking motions  or positions in order to be able to perform ADLs and mobility tasks without symptoms.    Baseline 06/30/19: reports no dizziness in last week; 6/21: one episode this weekend; 7/14: experiencing one a week; 03/01/2020.- Patient has denied any dizziness for over past 3 weeks.    Time 12    Period Weeks    Status Achieved      PT LONG TERM GOAL  #10   TITLE Patient will demonstrate improved functional LE/Postural strength as seen by ability to perform static standing for 2 min or greater with max assist  to assist with strength required for ADL's and transfers/pregait/gait activities.    Baseline 03/01/2020- Patient able to stand approx 40 sec today with max assist in parallel bars. 03/31/2020- Patient able to demo static stand with max A for 54mn 47 sec today. 04/19/2020- Patient able to demo 2 min today with mod/max assist - yet remains inconsistent.    Time 8    Period Weeks    Status Partially Met    Target Date 07/12/20                  Plan - 04/28/20 1509    Clinical Impression Statement Patient was able to stand better today- had a few trials today in which he was able to pull himself more forward and extend his hips and required less physical assist. Later in visit he was able to use a walker to stand but required more physical assist to stand. He will continue to benefit from skilled PT services to improve his functional strength and postural control for improved transfers/standing endurance.    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    Examination-Activity Limitations Bed Mobility;Bend;Sit;Toileting;Stand;Stairs;Lift;Transfers;Squat;Locomotion Level;Carry;Dressing;Hygiene/Grooming;Continence    Examination-Participation Restrictions Yard Work;Interpersonal Relationship;Community Activity    Stability/Clinical Decision Making Evolving/Moderate complexity    Rehab Potential Fair    PT Frequency 2x / week    PT Duration 12 weeks    PT Treatment/Interventions ADLs/Self Care Home Management;Electrical Stimulation;Moist Heat;Cryotherapy;Gait training;Stair training;Functional mobility training;Therapeutic exercise;Balance training;Neuromuscular re-education;Cognitive remediation;Patient/family education;Orthotic Fit/Training;Wheelchair mobility training;Manual techniques;Passive range of motion;Energy conservation;Joint Manipulations;Canalith Repostioning;Therapeutic activities;Vestibular    PT  Next Visit Plan Continue with progressive postural/LE strengthening.    PT Home Exercise Plan no changes    Consulted and Agree with Plan of Care Patient;Family member/caregiver    Family Member Consulted Caregiver- [place]          Patient will benefit from skilled therapeutic intervention in order to improve the following deficits and impairments:  Abnormal gait,Decreased activity tolerance,Decreased cognition,Decreased endurance,Decreased knowledge of use of DME,Decreased range of motion,Decreased skin integrity,Decreased strength,Impaired perceived functional ability,Impaired sensation,Impaired UE functional use,Improper body mechanics,Pain,Cardiopulmonary status limiting activity,Decreased balance,Decreased coordination,Decreased mobility,Difficulty walking,Impaired tone,Postural dysfunction,Dizziness  Visit Diagnosis: Muscle weakness (generalized)  Other lack of coordination  Difficulty in walking, not elsewhere classified  Repeated falls     Problem List Patient Active Problem List   Diagnosis Date Noted  . Sepsis ([place] 10/18/2018    JLewis Moccasin PT 04/29/2020, 12:57 PM  [facility] [address]  [place], [place], [address] Phone: [phone]   Fax:  [phone]  Name: DEBBIE BELLUCCI MRN: [id number] Date of Birth: 12-09-1945

## 2020-04-29 ENCOUNTER — Encounter: Payer: Medicare Other | Admitting: Occupational Therapy

## 2020-04-29 ENCOUNTER — Ambulatory Visit: Payer: Medicare Other

## 2020-05-03 ENCOUNTER — Ambulatory Visit: Payer: Medicare Other | Admitting: Occupational Therapy

## 2020-05-03 ENCOUNTER — Ambulatory Visit: Payer: Medicare Other

## 2020-05-03 ENCOUNTER — Other Ambulatory Visit: Payer: Self-pay

## 2020-05-03 ENCOUNTER — Encounter: Payer: Self-pay | Admitting: Occupational Therapy

## 2020-05-03 DIAGNOSIS — R262 Difficulty in walking, not elsewhere classified: Secondary | ICD-10-CM

## 2020-05-03 DIAGNOSIS — I69354 Hemiplegia and hemiparesis following cerebral infarction affecting left non-dominant side: Secondary | ICD-10-CM

## 2020-05-03 DIAGNOSIS — R278 Other lack of coordination: Secondary | ICD-10-CM

## 2020-05-03 DIAGNOSIS — M6281 Muscle weakness (generalized): Secondary | ICD-10-CM

## 2020-05-03 NOTE — Therapy (Signed)
[place] MAIN [facility] SERVICES [address] [place], [place], [address] Phone: [phone]   Fax:  [phone]  Occupational Therapy Treatment  Patient Details  Name: Kenneth Summers MRN: [id number] Date of Birth: 1945/08/18 No data recorded  Encounter Date: 05/03/2020   OT End of Session - 05/03/20 1401    Visit Number [id number]    Number of Visits 205    Date for OT Re-Evaluation 05/24/20    Authorization Type Progress report period starting 03/11/2020    OT Start Time 1345    OT Stop Time 1430    OT Time Calculation (min) 45 min    Activity Tolerance Patient tolerated treatment well    Behavior During Therapy [facility] for tasks assessed/performed           Past Medical History:  Diagnosis Date  . Actinic keratosis   . Alcohol abuse   . APS (antiphospholipid syndrome) ([place])   . Basal cell carcinoma   . BPH (benign prostatic hyperplasia)   . CAD (coronary artery disease)   . Cellulitis   . Cervical spinal stenosis   . Chronic osteomyelitis ([place])   . CKD (chronic kidney disease)   . CKD (chronic kidney disease)   . Clostridium difficile diarrhea   . Depression   . Diplopia   . Fatigue   . GERD (gastroesophageal reflux disease)   . Hyperlipemia   . Hypertension   . Hypovitaminosis D   . IBS (irritable bowel syndrome)   . 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 Insomnia   . Left lumbosacral radiculopathy   . Moderate obstructive sleep apnea   . Osteomyelitis of foot ([place])   . PAD (peripheral artery disease) ([place])   . Pruritus   . RA (rheumatoid arthritis) ([place])   . Radiculopathy   . Restless legs syndrome   . RLS (restless legs syndrome)   . SLE (systemic lupus erythematosus related syndrome) ([place])   . Squamous cell carcinoma of skin 01/27/2019   right mid lower ear helix  . Stroke ([place])   . Toxic maculopathy of both eyes     Past Surgical History:  Procedure Laterality Date  . CARDIAC CATHETERIZATION    . CAROTID ENDARTERECTOMY    .  CERVICAL LAMINECTOMY    . CORONARY ANGIOPLASTY    . FRACTURE SURGERY    . fusion C5-6-7    . HERNIA REPAIR    . KNEE ARTHROSCOPY    . TONSILLECTOMY      There were no vitals filed for this visit.   Subjective Assessment - 05/03/20 1359    Subjective  Pt reports that yesterday he slid out of his wheelchair, reclined too far and had slippery pants on.    Pertinent History Pt. is a 75 y.o. 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 Pt. PMHx includes: Multiple Falls, Lupus, DJD, Renal Abscess, CVA. Pt. resides with his wife. Pt.'s wife and daughter assist with ADLs. Pt. has caregivers in  for 2 hours a day, 6 days a week. Pt. received Rehab services in acute care, at SNF for STR, and [place] services. Pt. is retired from [employer] for [employer] and [employer].    Patient Stated Goals To regain use of his left UE, and do more for himself.    Currently in Pain? No/denies              Therex  Pt worked on [place]  for reciprocal motion, therapist present to adjust settings and technique. Pt requires MIN A to place L hand on handle following x3 loss of grip. Pt completed 2 set x 10 reps each of seated UE exercises using 3# dowel for shoulder flexion/extension, bicep flexion/extension and 2# dumbbell wrist flexion/extension and forearm supination/pronation. Pt becomes easily distracted and requires MOD cues to perform using LUE. Pt worked on reaching for [employer], and moving them through 4 levels of horizontal rungs placed at progressively higher heights. Pt completed 1st and 2nd level using LUE, required RUE to reach 3rd and fourth highest level             OT Education - 05/03/20 1401    Education Details strengthening, UE functioning    Person(s) Educated Patient    Methods Explanation;Demonstration    Comprehension Returned demonstration;Tactile cues required;Verbalized understanding;Need further instruction                OT Long Term Goals - 04/19/20 1353      OT LONG TERM GOAL #1   Title Pt. will increase left shoulder flexion AROM by 10 degrees to access cabinet/shelf.    Baseline Pt. continues to present with limited left shoulder ROM limitng access to cabinets, and shelves.03/01/2020: Left shoulder 116 degrees with new onset of numbness radiating from the left side of the neck to the hand. Pt. continues to progress with left shoulder ROM .Shouler flexion 130, and continues to work on reaching with increasing emphasis on flexing trunk to improve functional reach.  07/21/19- shoulder flexion to 130 with effort    Time 12    Period Weeks    Status On-going    Target Date 05/24/20      OT LONG TERM GOAL #2   Title Pt. will donn a shirt with Supervision.    Baseline Pt. continues to require modA donning a zip down jacket with consistent cues to initiate the correct direction of the jacket.  Min-modA mahaging the zipper. MinA for donning a T-shirt with assist to roll side to side to pull the shirt down over his back. 07/21/19 continues to require min assist at times for shirt/jacket    Time 12    Period Weeks    Status On-going    Target Date 05/24/20      OT LONG TERM GOAL #4   Title Pt. will improve left grip strength by 10# to be able to open a jar/container.    Baseline Grip strength conitnues to be limited. Decreased left sided grip strength today secondary to a new onset of numbenss, and weakness radiating from the neck to the left hand. Pt. continues to have a harder time opening a wide mouthed jar, however is able to open a smaller jar easier.    Time 12    Period Weeks    Status On-going    Target Date 05/24/20      OT LONG TERM GOAL #5   Title Pt. will improve left hand [facility] skills to be able to assist with buttoning/zipping.    Baseline Pt. conitnues to work on improving [facility] skills  to improve buttoning , and zipping.03/01/2020: Pt.has had a new onset of weakness, and numbness in the  left hand beginning upon waking up this morning which has resulted in a significant change in [facility] skills.  Pt. has improved overall donning a jacket, and  manipulating the zipper on his jacket, however continues to require practice depending on the type of zipper  jacket. Pt. continues to work on improving bilateral [facility] skills for buttoning/zipping.  07/21/19 improving with buttons and zippers.    Time 12    Period Weeks    Status On-going    Target Date 05/24/20      OT LONG TERM GOAL #6   Title Pt. will demonstrate visual conmpensatory strategies for 100% of the time during ADLs    Baseline Improving, however Pt. continues to require intermittent cues cues for left sides awareness.    Time 12    Period Weeks    Status On-going    Target Date 05/24/20      OT LONG TERM GOAL #8   Title Pt. will accurately identify potential safety hazard using good safety awareness, and judgement 100% for ADLs, and IADLs.    Baseline Pt. continues to require cues    Time 12    Period Weeks    Status On-going    Target Date 05/24/20      OT LONG TERM GOAL  #9   TITLE Pt. will  navigate his w/c around obstacles with Supervision and 100% accuracy    Baseline Pt. has progressed, however, continues to require cuing for obstacles on the left with fewer  cues overall. Pt. requires significant cues to engage his left UE during tasks. 07/21/2019 continues to improve with decreased assist at times    Time 12    Period Weeks    Status On-going    Target Date 05/24/20      OT LONG TERM GOAL  #10   TITLE Pt. will independently be able  to reach to place items into cabinetry, and closets.    Baseline Pt. continues to have difficulty with functional reaching continues to be limited.    Time 12    Period Weeks    Status On-going    Target Date 05/24/20      OT LONG TERM GOAL  #11   TITLE Pt. will increase BUE strength by 77mm grades in preparation for ADL transfers.    Baseline Pt. is progressing, however BUE  strength continues to be limited. Pt. is able to tolerate increased resistive weights. Pt. continues to work towards improving overall strength for ADLs.    Time 12    Period Weeks    Status On-going    Target Date 06/03/20      OT LONG TERM GOAL  #12   TITLE Pt. will improve LUE functional reaching with the left engaging the trunk 100% of the time during ADL tasks with minimal cues.    Baseline Left sided numbness has improved. New onset of left sided numbness, and weakness is affecting his ability to perform reaching.    Time 12    Period Weeks    Status On-going    Target Date 05/24/20                 Plan - 05/03/20 1401    Clinical Impression Statement Patient reports no pain from his recent fall out of wheelchair. Pt continues to require cues and visual demonstration of proper technique. Performs well with repetitions and familiar exercises.  Continue OT towards goals in plan of care to maximize safety and independence in necessary daily tasks at home and in the community.    OT Occupational Profile and History Problem Focused Assessment - Including review of records relating to presenting problem    Occupational performance deficits (Please refer to evaluation for details): ADL's    Body Structure /  Function / Physical Skills UE functional use;Coordination;FMC;Dexterity;Strength;ROM    Cognitive Skills Attention;Memory;Emotional;Problem Solve;Safety Awareness    Rehab Potential Good    Clinical Decision Making Several treatment options, min-mod task modification necessary    Comorbidities Affecting Occupational Performance: Presence of comorbidities impacting occupational performance    Comorbidities impacting occupational performance description: Phyical, cognitive, visual,  medical comorbidities    Modification or Assistance to Complete Evaluation  No modification of tasks or assist necessary to complete eval    OT Frequency 2x / week    OT Duration 12 weeks    OT  Treatment/Interventions Self-care/ADL training;DME and/or AE instruction;Therapeutic exercise;Therapeutic activities;Moist Heat;Cognitive remediation/compensation;Neuromuscular education;Visual/perceptual remediation/compensation;Coping strategies training;Patient/family education;Passive range of motion;Psychosocial skills training;Energy conservation;Functional Mobility Training    Consulted and Agree with Plan of Care Patient;Family member/caregiver           Patient will benefit from skilled therapeutic intervention in order to improve the following deficits and impairments:   Body Structure / Function / Physical Skills: UE functional use,Coordination,FMC,Dexterity,Strength,ROM Cognitive Skills: Attention,Memory,Emotional,Problem Solve,Safety Awareness     Visit Diagnosis: Other lack of coordination  Hemiplegia and hemiparesis following cerebral infarction affecting left non-dominant side [facility])    Problem List Patient Active Problem List   Diagnosis Date Noted  . Sepsis ([place]) 10/18/2018    Dessie Coma, M.S. OTR/L  05/03/20, 2:30 PM  ascom [phone]  [place] MAIN [facility] SERVICES [address] [place], [place], [address] Phone: [phone]   Fax:  [phone]  Name: Kenneth Summers MRN: [id number] Date of Birth: 1945-08-31

## 2020-05-03 NOTE — Therapy (Signed)
[place] MAIN [facility] SERVICES [address] [place], [place], [address] Phone: [phone]   Fax:  [phone]  Physical Therapy Treatment  Patient Details  Name: Kenneth Summers MRN: [id number] Date of Birth: 10-18-1945 No data recorded  Encounter Date: 05/03/2020   PT End of Session - 05/03/20 2053    Visit Number 231    Number of Visits 276    Date for PT Re-Evaluation 07/12/20    Authorization Type Medicare reporting period starting 09/11/18; NO FOTO    Authorization Time Period Recert 9/50/9326-08/06/2456    PT Start Time 1259    PT Stop Time 1343    PT Time Calculation (min) 44 min    Equipment Utilized During Treatment Gait belt    Activity Tolerance No increased pain;Patient limited by fatigue;Patient tolerated treatment well    Behavior During Therapy [facility] for tasks assessed/performed           Past Medical History:  Diagnosis Date  . Actinic keratosis   . Alcohol abuse   . APS (antiphospholipid syndrome) ([place])   . Basal cell carcinoma   . BPH (benign prostatic hyperplasia)   . CAD (coronary artery disease)   . Cellulitis   . Cervical spinal stenosis   . Chronic osteomyelitis ([place])   . CKD (chronic kidney disease)   . CKD (chronic kidney disease)   . Clostridium difficile diarrhea   . Depression   . Diplopia   . Fatigue   . GERD (gastroesophageal reflux disease)   . Hyperlipemia   . Hypertension   . Hypovitaminosis D   . IBS (irritable bowel syndrome)   . 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 Insomnia   . Left lumbosacral radiculopathy   . Moderate obstructive sleep apnea   . Osteomyelitis of foot ([place])   . PAD (peripheral artery disease) ([place])   . Pruritus   . RA (rheumatoid arthritis) ([place])   . Radiculopathy   . Restless legs syndrome   . RLS (restless legs syndrome)   . SLE (systemic lupus erythematosus related syndrome) ([place])   . Squamous cell carcinoma of skin 01/27/2019   right mid lower ear helix  . Stroke ([place])    . Toxic maculopathy of both eyes     Past Surgical History:  Procedure Laterality Date  . CARDIAC CATHETERIZATION    . CAROTID ENDARTERECTOMY    . CERVICAL LAMINECTOMY    . CORONARY ANGIOPLASTY    . FRACTURE SURGERY    . fusion C5-6-7    . HERNIA REPAIR    . KNEE ARTHROSCOPY    . TONSILLECTOMY      There were no vitals filed for this visit.   Subjective Assessment - 05/03/20 1326    Subjective Patient reports falling (sliding out of wheelchair on 05/01/2020)- assisted up by caregiver and denied any pain.    Patient is accompained by: Family member    Pertinent History Patient is a 75 y.o. male who presents to outpatient physical therapy with a referral for medical diagnosis of CVA. This patient's chief complaints consist of left hemiplegia and overall deconditioning leading to the following functional deficits: dependent for ADLs, IADLs, unable to transfer to car, dependent transfers at home with hoyer lift, unable to walk or stand without significant assistance, difficulty with W/C navigation..    Limitations Sitting;Lifting;Standing;House hold activities;Writing;Walking;Other (comment)    Diagnostic tests Brain MRI 11/01/2017: IMPRESSION: 1. Acute right ACA territory infarct as demonstrated on prior CT imaging. No intracranial hemorrhage  or significant mass effect. 2. 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    Patient Stated Goals wants to be able to walk again    Pain Onset Today    Pain Onset Today                  Interventions:  Patient arrived and pulled up at the //bars but upon observation and discussion/subjective info - noticed that patient could not sit up straight and was listing to the left. PT asked him to sit up straight and he looked fatigued and stated he was trying but just couldn't. He did report a fall on Saturday and denied any injury but later in session complained of pain with leaning forward.   Seated Hip march,  Hip adduction ball squeeze, knee ext - Patient required min A with right LE and Max assist with left LE today.  Postural exercises- Positioned mirror at other end of //bars for feedback so patient could see that he was not sitting up straight. He was able to respond to this feedback and somewhat improve yet unable to maintain today. Patient performed dynamic UE reaching to facilitate forward/upright posture x 5 min. Patient performed UE pushing ball forward in // bars to improve forward lean/posture. He did fair with this exercise yet required constant VC and tactile cues to perform correctly.     Clinical Impression: Patient initially struggled to maintain erect sitting posture and was leaning to left side with difficulty leaning to right or sitting upright. He did improve as session developed but not safe to try to stand today and demonstrated increased weakness overall with left LE and max cues today for posture. He will continue to benefit from skilled PT services to improve his functional strength and postural control for improved transfers/standing endurance.                   PT Education - 05/03/20 2052    Education Details seated postural cues.    Person(s) Educated Patient    Methods Explanation;Demonstration;Tactile cues;Verbal cues    Comprehension Verbalized understanding;Verbal cues required;Need further instruction;Returned demonstration;Tactile cues required            PT Short Term Goals - 04/19/20 2317      PT SHORT TERM GOAL #1   Title Be independent with initial home exercise program for self-management of symptoms.    Baseline HEP to be given at second session; advice to practice unsupported sitting at home (06/19/2018); 07/08/18: Performing at home, 6/29: needs assistance but is doing exercise program regularly; 08/22/18: adherent;    Time 2    Period Weeks    Status Achieved    Target Date 06/26/18      PT SHORT TERM GOAL #2   Title Patient will perform  sit to stand transfer with mod Assist in parallel bars for improved transfer ability with decreased caregiver assist.    Baseline 04/19/2020- Max assist of 1 person    Time 6    Period Weeks    Status New    Target Date 05/31/20             PT Long Term Goals - 04/19/20 1321      PT LONG TERM GOAL #1   Title Patient will complete all bed mobility with min A to improve functional independence for getting in and out of bed and adjusting in bed.     Baseline max A - total A reported  by family (06/19/2018); 07/08/18: still requires maxA, dtr reports improvement in rolling since starting therapy; 6/23: min A to supervision in clinic; not doing at home right; 8/5: Pt's daughter states that his rolling has improved, but still has difficulty with all other bed mobility; 10/23/18: pts daughter states that she is doing about 35% of the work if he is in proper position, he does uses the bed rails to help roll;  using Harrel Lemon for coming to sit EOB; 10/12: pts daughter states that she is doing about 30% of the work if he is in proper position, he does uses the bed rails to help roll, but it is improving;  using Harrel Lemon for coming to sit EOB, 10/29: min A for sit to sidelying, rolling supervision, able to transition sidelying to sit with HHA; 12/21: min-mod A. 03/01/2020- Patient presents improving bed mobility with current ability to roll to left with CGA and verbal and tactile cues and min assist to roll to right as seen by recent visits. 03/31/2020- Patient and caregiver report that he has maintained ability to roll side to side with only minimal assistance.    Time 12    Period Weeks    Status Achieved      PT LONG TERM GOAL #2   Title Patient will complete sit <> stand transfer chair to chair with Min A and LRAD to improve functional independence for household and community mobility.     Baseline min A for STS, still requires elevated surface height to 27".  03/01/20- Patient currently unable to perform a chair  to chair transfer and continues to be transferred using hoyer lift. Patient has been training with sit to stand at parallel bars and able to currently perform sit to stand with max assist  from 27in. high surface.03/31/2020- Patient remains at [facility] assist with sit to stand transfer and continued use of hoyer lift transfer for all chair to chair or bed to chair transfers. 04/19/2020- Patient requires Max assist with sit to stand transfers and requires hoyer for chair to chair transfers.    Time 12    Period Weeks    Status On-going    Target Date 07/12/20      PT LONG TERM GOAL #3   Title Patient will navigate power w/c with min A x 100 feet to improve mobility for household and short community distances.    Baseline required total A (06/12/2018); able to roll 20 feet with min A (06/20/2018); 07/08/18: Pt can go approximately 10' without assist per family;  10/23/2018: pt's daughter states that his power WC mobility has become 100% better with the new hand control, slowed speed, and with practice; 10/12: pt's daughter reports that he can perform household WC mobility and limited community ambulation with minA    Time 12    Period Weeks    Status Achieved      PT LONG TERM GOAL #4   Title Patient will complete car transfers W/C to family SUV with min A using LRAD to improve his abilty to participate in community activities.     Baseline unable to complete car transfers at all (06/12/2018); unable to complete car transfers at all (06/19/2018).; 07/08/18: Unable to perform at this time; 09/11/18: unable to perform at this time; 10/23/2018: unable to complete car transfers at all; 10/12:  unable to complete car transfers at all, 10/29: unable; 01/13/19: unable; 2/11: min-mod A +2, 3/10: min A+1, 12/21: mod A. 03/01/2020- Patient unable to complete car transfers and  continues to require medical transportation at this time. 3/14- Patient reports he may be purchasing a conversion van later this week.    Time 12    Period  Weeks    Status Deferred      PT LONG TERM GOAL #5   Title Patient will ambulate at least 10 feet with min A using LRAD to improve mobility for household and community distances.    Baseline unable to take steps (06/12/2018); able to shift R leg forward and back with max A and RW at edge of plinth (06/19/2018); 07/08/18: unable to ambulate at this time. 09/11/18: unable to perform at this time; 10/23/2018: unable to perform at this time; 10/12: unable to perform at this time, 10/29: unable at this time; 01/13/19: unable at this time, 2/11: min A to step with RLE, unable to step with LLE, 3/10: able to take 2 steps in parallel bars with min A for safety; 4/1: min A for walking 10 feet in parallel bars, 5/24: min A walking 10 feet in parallel bars; 6/21: no change 7/14: no change. 03/01/2020- Patient unable to ambulate and currently focusing on static standing. He was able to take a forward step with his right LE last week but unable today. 03/31/2020- Patient continues to be non-ambulatory but has been able to move right LE forward with standing but unable to take a step with left LE. 04/19/2020- Goal is still not appropriate as of yet- cont to focus on static and dynamic standing balance/pregait activities.    Time 12    Period Weeks    Status Deferred    Target Date 07/12/20      PT LONG TERM GOAL #6   Title Patient will be able to safety navigate up/down ramp with power chair, exhibiting good safety awareness, mod I to safely enter/exit his home.    Baseline 2/11: supervision with intermittent min A;4/1: supervision; 5/24: supervision. 03/01/2020- patient reports that he is able to navigate down the ramp at home to leave the home but continues to need assist to navigate up the ramp into the home.    Time 12    Period Weeks    Status Partially Met      PT LONG TERM GOAL #7   Title Patient will increase functional reach test to >15 inches in sitting to exhibit improved sitting balance and positioning and  reduce fall risk;    Baseline 07/30/18: 12 inches; 09/11/18: 10-12 inches; 10/23/18: 12-13 inch; 10/12: 14.5", 10/29: 15.5 inch    Time 4    Period Weeks    Status Achieved      PT LONG TERM GOAL #8   Title Patient will increase LLE quad strength to 3+/5 to improve functional strength for standing and mobility;    Baseline 10/28: 2+/5; 01/13/19: 2+/5, 2/11: 3-/5, 04/16/19: 3-/5, 05/08/19: 3-/5, 5/24: 3-/5, 6/21: 3-/5, 7/14: 3-/5, 12/21: 3-/5. 03/01/2020- Left quad= 3-/5 (focusing on quad strengthening during visits and with home program). 03/31/2020- left quad = 3-/5 with MMT. 04/19/2020= 3- /5 with left quad strength with manual muscle testing.    Time 12    Period Weeks    Status On-going    Target Date 07/12/20      PT LONG TERM GOAL  #9   TITLE Patient will report no vertigo with provoking motions or positions in order to be able to perform ADLs and mobility tasks without symptoms.    Baseline 06/30/19: reports no dizziness in last week; 6/21: one episode  this weekend; 7/14: experiencing one a week; 03/01/2020.- Patient has denied any dizziness for over past 3 weeks.    Time 12    Period Weeks    Status Achieved      PT LONG TERM GOAL  #10   TITLE Patient will demonstrate improved functional LE/Postural strength as seen by ability to perform static standing for 2 min or greater with max assist  to assist with strength required for ADL's and transfers/pregait/gait activities.    Baseline 03/01/2020- Patient able to stand approx 40 sec today with max assist in parallel bars. 03/31/2020- Patient able to demo static stand with max A for 47 sec today. 04/19/2020- Patient able to demo 2 min today with mod/max assist - yet remains inconsistent.    Time 8    Period Weeks    Status Partially Met    Target Date 07/12/20                 Plan - 05/03/20 2054    Clinical Impression Statement Patient initially struggled to maintain erect sitting posture and was leaning to left side with  difficulty leaning to right or sitting upright. He did improve as session developed but not safe to try to stand today and demonstrated increased weakness overall with left LE and max cues today for posture. He will continue to benefit from skilled PT services to improve his functional strength and postural control for improved transfers/standing endurance.    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    Examination-Activity Limitations Bed Mobility;Bend;Sit;Toileting;Stand;Stairs;Lift;Transfers;Squat;Locomotion Level;Carry;Dressing;Hygiene/Grooming;Continence    Examination-Participation Restrictions Yard Work;Interpersonal Relationship;Community Activity    Stability/Clinical Decision Making Evolving/Moderate complexity    Rehab Potential Fair    PT Frequency 2x / week    PT Duration 12 weeks    PT Treatment/Interventions ADLs/Self Care Home Management;Electrical Stimulation;Moist Heat;Cryotherapy;Gait training;Stair training;Functional mobility training;Therapeutic exercise;Balance training;Neuromuscular re-education;Cognitive remediation;Patient/family education;Orthotic Fit/Training;Wheelchair mobility training;Manual techniques;Passive range of motion;Energy conservation;Joint Manipulations;Canalith Repostioning;Therapeutic activities;Vestibular    PT Next Visit Plan Continue with progressive postural/LE strengthening.    PT Home Exercise Plan no changes     Consulted and Agree with Plan of Care Patient;Family member/caregiver    Family Member Consulted Caregiver- Crystal           Patient will benefit from skilled therapeutic intervention in order to improve the following deficits and impairments:  Abnormal gait,Decreased activity tolerance,Decreased cognition,Decreased endurance,Decreased knowledge of use of DME,Decreased range of motion,Decreased skin integrity,Decreased strength,Impaired perceived functional ability,Impaired sensation,Impaired UE functional use,Improper body mechanics,Pain,Cardiopulmonary status limiting activity,Decreased balance,Decreased coordination,Decreased mobility,Difficulty walking,Impaired tone,Postural dysfunction,Dizziness  Visit Diagnosis: Muscle weakness (generalized)  Other lack of coordination  Hemiplegia and hemiparesis following cerebral infarction affecting left non-dominant side (HCC)  Difficulty in walking, not elsewhere classified     Problem List Patient Active Problem List   Diagnosis Date Noted  . Sepsis (HCC) 10/18/2018    Lenda Kelp, PT 05/03/2020, 9:12 PM  [place] [facility] MAIN [facility] SERVICES [address] [place], [place], [address] Phone: [phone]   Fax:  [phone]  Name: Kenneth Summers MRN: [id number] Date of  Birth: [birth year]

## 2020-05-05 ENCOUNTER — Other Ambulatory Visit: Payer: Self-pay

## 2020-05-05 ENCOUNTER — Ambulatory Visit: Payer: Medicare Other | Admitting: Occupational Therapy

## 2020-05-05 ENCOUNTER — Ambulatory Visit: Payer: Medicare Other

## 2020-05-05 DIAGNOSIS — I69354 Hemiplegia and hemiparesis following cerebral infarction affecting left non-dominant side: Secondary | ICD-10-CM | POA: Diagnosis not present

## 2020-05-05 DIAGNOSIS — R278 Other lack of coordination: Secondary | ICD-10-CM

## 2020-05-05 DIAGNOSIS — M6281 Muscle weakness (generalized): Secondary | ICD-10-CM

## 2020-05-05 DIAGNOSIS — R262 Difficulty in walking, not elsewhere classified: Secondary | ICD-10-CM

## 2020-05-05 NOTE — Therapy (Signed)
[place] MAIN [facility] SERVICES [address] [place], [place], [address] Phone: [phone]   Fax:  [phone]  Physical Therapy Treatment  Patient Details  Name: Kenneth Summers MRN: [id number] Date of Birth: 04/15/1945 No data recorded  Encounter Date: 05/05/2020   PT End of Session - 05/05/20 1257    Visit Number [id number]    Number of Visits 276    Date for PT Re-Evaluation 07/12/20    Authorization Type Medicare reporting period starting 09/11/18; NO FOTO    Authorization Time Period Recert 06/14/3265-02/07/4578    PT Start Time 1300    PT Stop Time 1341    PT Time Calculation (min) 41 min    Equipment Utilized During Treatment Gait belt    Activity Tolerance No increased pain;Patient limited by fatigue;Patient tolerated treatment well    Behavior During Therapy [facility] for tasks assessed/performed           Past Medical History:  Diagnosis Date  . Actinic keratosis   . Alcohol abuse   . APS (antiphospholipid syndrome) ([place])   . Basal cell carcinoma   . BPH (benign prostatic hyperplasia)   . CAD (coronary artery disease)   . Cellulitis   . Cervical spinal stenosis   . Chronic osteomyelitis ([place])   . CKD (chronic kidney disease)   . CKD (chronic kidney disease)   . Clostridium difficile diarrhea   . Depression   . Diplopia   . Fatigue   . GERD (gastroesophageal reflux disease)   . Hyperlipemia   . Hypertension   . Hypovitaminosis D   . IBS (irritable bowel syndrome)   . 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 Insomnia   . Left lumbosacral radiculopathy   . Moderate obstructive sleep apnea   . Osteomyelitis of foot ([place])   . PAD (peripheral artery disease) ([place])   . Pruritus   . RA (rheumatoid arthritis) ([place])   . Radiculopathy   . Restless legs syndrome   . RLS (restless legs syndrome)   . SLE (systemic lupus erythematosus related syndrome) ([place])   . Squamous cell carcinoma of skin 01/27/2019   right mid lower ear helix  . Stroke ([place])    . Toxic maculopathy of both eyes     Past Surgical History:  Procedure Laterality Date  . CARDIAC CATHETERIZATION    . CAROTID ENDARTERECTOMY    . CERVICAL LAMINECTOMY    . CORONARY ANGIOPLASTY    . FRACTURE SURGERY    . fusion C5-6-7    . HERNIA REPAIR    . KNEE ARTHROSCOPY    . TONSILLECTOMY      There were no vitals filed for this visit.   Subjective Assessment - 05/05/20 1256    Subjective Patient reports being more tired today but otherwise doing okay.    Patient is accompained by: Family member    Pertinent History Patient is a 75 y.o. male who presents to outpatient physical therapy with a referral for medical diagnosis of CVA. This patient's chief complaints consist of left hemiplegia and overall deconditioning leading to the following functional deficits: dependent for ADLs, IADLs, unable to transfer to car, dependent transfers at home with hoyer lift, unable to walk or stand without significant assistance, difficulty with W/C navigation..    Limitations Sitting;Lifting;Standing;House hold activities;Writing;Walking;Other (comment)    Diagnostic tests Brain MRI 11/01/2017: IMPRESSION: 1. Acute right ACA territory infarct as demonstrated on prior CT imaging. No intracranial hemorrhage or significant mass effect. 2. 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    Patient Stated Goals wants to be able to walk again    Currently in Pain? No/denies    Pain Onset Today    Pain Onset Today           Interventions:   Therapeutic exercises:   Seated hip march: AROM (full on right) x 15 reps; AAROM on left Seated knee ext: AROM (full on right) 2 sets of 10 reps; AAROM on left LE- 2 sets of 10 reps  Standing trials 1) 1 min 33 sec-  from max A down to min A to maintain standing while continuing blocking left knee.  2) 1 min 7 sec-  Less overall cues for erect standing.  3) 52 sec 4) 1 min 10 sec-   5) 38 sec       Clinical Impression:  Patient able to achieve good ROM and activation of Right LE today and overall improved ability to achieve and maintain center of gravity. Also more consistent time with standing exhibiting overall improved standing endurance.  He will continue to benefit from skilled PT services to improve his functional strength and postural control for improved transfers/standing endurance.                    PT Education - 05/06/20 0640    Education Details Postural cues for standing/sitting    Person(s) Educated Patient    Methods Explanation;Demonstration;Tactile cues;Verbal cues    Comprehension Verbalized understanding;Verbal cues required;Need further instruction;Returned demonstration;Tactile cues required            PT Short Term Goals - 04/19/20 2317      PT SHORT TERM GOAL #1   Title Be independent with initial home exercise program for self-management of symptoms.    Baseline HEP to be given at second session; advice to practice unsupported sitting at home (06/19/2018); 07/08/18: Performing at home, 6/29: needs assistance but is doing exercise program regularly; 08/22/18: adherent;    Time 2    Period Weeks    Status Achieved    Target Date 06/26/18      PT SHORT TERM GOAL #2   Title Patient will perform sit to stand transfer with mod Assist in parallel bars for improved transfer ability with decreased caregiver assist.    Baseline 04/19/2020- Max assist of 1 person    Time 6    Period Weeks    Status New    Target Date 05/31/20             PT Long Term Goals - 04/19/20 1321      PT LONG TERM GOAL #1   Title Patient will complete all bed mobility with min A to improve functional independence for getting in and out of bed and adjusting in bed.     Baseline max A - total A reported by family (06/19/2018); 07/08/18: still requires maxA, dtr reports improvement in rolling since starting therapy; 6/23: min A to supervision in clinic; not doing at home right; 8/5: Pt's daughter  states that his rolling has improved, but still has difficulty with all other bed mobility; 10/23/18: pts daughter states that she is doing about 35% of the work if he is in proper position, he does uses the bed rails to help roll;  using Michiel Sites for coming to sit EOB; 10/12: pts daughter states that she is doing about 30% of the work if he is in proper position, he does uses  the bed rails to help roll, but it is improving;  using Harrel Lemon for coming to sit EOB, 10/29: min A for sit to sidelying, rolling supervision, able to transition sidelying to sit with HHA; 12/21: min-mod A. 03/01/2020- Patient presents improving bed mobility with current ability to roll to left with CGA and verbal and tactile cues and min assist to roll to right as seen by recent visits. 03/31/2020- Patient and caregiver report that he has maintained ability to roll side to side with only minimal assistance.    Time 12    Period Weeks    Status Achieved      PT LONG TERM GOAL #2   Title Patient will complete sit <> stand transfer chair to chair with Min A and LRAD to improve functional independence for household and community mobility.     Baseline min A for STS, still requires elevated surface height to 27".  03/01/20- Patient currently unable to perform a chair to chair transfer and continues to be transferred using hoyer lift. Patient has been training with sit to stand at parallel bars and able to currently perform sit to stand with max assist  from 27in. high surface.03/31/2020- Patient remains at [facility] assist with sit to stand transfer and continued use of hoyer lift transfer for all chair to chair or bed to chair transfers. 04/19/2020- Patient requires Max assist with sit to stand transfers and requires hoyer for chair to chair transfers.    Time 12    Period Weeks    Status On-going    Target Date 07/12/20      PT LONG TERM GOAL #3   Title Patient will navigate power w/c with min A x 100 feet to improve mobility for household and  short community distances.    Baseline required total A (06/12/2018); able to roll 20 feet with min A (06/20/2018); 07/08/18: Pt can go approximately 10' without assist per family;  10/23/2018: pt's daughter states that his power WC mobility has become 100% better with the new hand control, slowed speed, and with practice; 10/12: pt's daughter reports that he can perform household WC mobility and limited community ambulation with minA    Time 12    Period Weeks    Status Achieved      PT LONG TERM GOAL #4   Title Patient will complete car transfers W/C to family SUV with min A using LRAD to improve his abilty to participate in community activities.     Baseline unable to complete car transfers at all (06/12/2018); unable to complete car transfers at all (06/19/2018).; 07/08/18: Unable to perform at this time; 09/11/18: unable to perform at this time; 10/23/2018: unable to complete car transfers at all; 10/12:  unable to complete car transfers at all, 10/29: unable; 01/13/19: unable; 2/11: min-mod A +2, 3/10: min A+1, 12/21: mod A. 03/01/2020- Patient unable to complete car transfers and continues to require medical transportation at this time. 3/14- Patient reports he may be purchasing a conversion van later this week.    Time 12    Period Weeks    Status Deferred      PT LONG TERM GOAL #5   Title Patient will ambulate at least 10 feet with min A using LRAD to improve mobility for household and community distances.    Baseline unable to take steps (06/12/2018); able to shift R leg forward and back with max A and RW at edge of plinth (06/19/2018); 07/08/18: unable to ambulate at this time.  09/11/18: unable to perform at this time; 10/23/2018: unable to perform at this time; 10/12: unable to perform at this time, 10/29: unable at this time; 01/13/19: unable at this time, 2/11: min A to step with RLE, unable to step with LLE, 3/10: able to take 2 steps in parallel bars with min A for safety; 4/1: min A for walking 10 feet in  parallel bars, 5/24: min A walking 10 feet in parallel bars; 6/21: no change 7/14: no change. 03/01/2020- Patient unable to ambulate and currently focusing on static standing. He was able to take a forward step with his right LE last week but unable today. 03/31/2020- Patient continues to be non-ambulatory but has been able to move right LE forward with standing but unable to take a step with left LE. 04/19/2020- Goal is still not appropriate as of yet- cont to focus on static and dynamic standing balance/pregait activities.    Time 12    Period Weeks    Status Deferred    Target Date 07/12/20      PT LONG TERM GOAL #6   Title Patient will be able to safety navigate up/down ramp with power chair, exhibiting good safety awareness, mod I to safely enter/exit his home.    Baseline 2/11: supervision with intermittent min A;4/1: supervision; 5/24: supervision. 03/01/2020- patient reports that he is able to navigate down the ramp at home to leave the home but continues to need assist to navigate up the ramp into the home.    Time 12    Period Weeks    Status Partially Met      PT LONG TERM GOAL #7   Title Patient will increase functional reach test to >15 inches in sitting to exhibit improved sitting balance and positioning and reduce fall risk;    Baseline 07/30/18: 12 inches; 09/11/18: 10-12 inches; 10/23/18: 12-13 inch; 10/12: 14.5", 10/29: 15.5 inch    Time 4    Period Weeks    Status Achieved      PT LONG TERM GOAL #8   Title Patient will increase LLE quad strength to 3+/5 to improve functional strength for standing and mobility;    Baseline 10/28: 2+/5; 01/13/19: 2+/5, 2/11: 3-/5, 04/16/19: 3-/5, 05/08/19: 3-/5, 5/24: 3-/5, 6/21: 3-/5, 7/14: 3-/5, 12/21: 3-/5. 03/01/2020- Left quad= 3-/5 (focusing on quad strengthening during visits and with home program). 03/31/2020- left quad = 3-/5 with MMT. 04/19/2020= 3- /5 with left quad strength with manual muscle testing.    Time 12    Period Weeks    Status  On-going    Target Date 07/12/20      PT LONG TERM GOAL  #9   TITLE Patient will report no vertigo with provoking motions or positions in order to be able to perform ADLs and mobility tasks without symptoms.    Baseline 06/30/19: reports no dizziness in last week; 6/21: one episode this weekend; 7/14: experiencing one a week; 03/01/2020.- Patient has denied any dizziness for over past 3 weeks.    Time 12    Period Weeks    Status Achieved      PT LONG TERM GOAL  #10   TITLE Patient will demonstrate improved functional LE/Postural strength as seen by ability to perform static standing for 2 min or greater with max assist  to assist with strength required for ADL's and transfers/pregait/gait activities.    Baseline 03/01/2020- Patient able to stand approx 40 sec today with max assist in parallel bars. 03/31/2020- Patient able  to demo static stand with max A for 73min 47 sec today. 04/19/2020- Patient able to demo 2 min today with mod/max assist - yet remains inconsistent.    Time 8    Period Weeks    Status Partially Met    Target Date 07/12/20                 Plan - 05/05/20 0641    Clinical Impression Statement Patient able to achieve good ROM and activation of Right LE today and overall improved ability to achieve and maintain center of gravity. Also more consistent time with standing exhibiting overall improved standing endurance.   He will continue to benefit from skilled PT services to improve his functional strength and postural control for improved transfers/standing endurance.    Personal Factors and Comorbidities Age;Comorbidity 3+    Comorbidities Relevant past medical history and comorbidities include long term steroid use for lupus, CKD, chronic osteomyelitis, cervical spine stenosis, BPH, APS, alcohol abuse, peripheral artery disease, depression, GERD, obstructive sleep apnea, HTN, IBS, IgA deficiency, lumbar radiculopathy, RA, Stroke, toxic maculopathy in both eyes, systemic  lupus erythematosus related syndrome, cardiac catheterization, carotid endarterectomy, cervical laminectomy, coronary angioplasty, Fusion C5-C7, knee arthroplasty, lumbar surgery.    Examination-Activity Limitations Bed Mobility;Bend;Sit;Toileting;Stand;Stairs;Lift;Transfers;Squat;Locomotion Level;Carry;Dressing;Hygiene/Grooming;Continence    Examination-Participation Restrictions Yard Work;Interpersonal Relationship;Community Activity    Stability/Clinical Decision Making Evolving/Moderate complexity    Rehab Potential Fair    PT Frequency 2x / week    PT Duration 12 weeks    PT Treatment/Interventions ADLs/Self Care Home Management;Electrical Stimulation;Moist Heat;Cryotherapy;Gait training;Stair training;Functional mobility training;Therapeutic exercise;Balance training;Neuromuscular re-education;Cognitive remediation;Patient/family education;Orthotic Fit/Training;Wheelchair mobility training;Manual techniques;Passive range of motion;Energy conservation;Joint Manipulations;Canalith Repostioning;Therapeutic activities;Vestibular    PT Next Visit Plan Continue with progressive postural/LE strengthening/pregait activities    PT Home Exercise Plan no changes    Consulted and Agree with Plan of Care Patient;Family member/caregiver    Family Member Consulted Caregiver- [place]           Patient will benefit from skilled therapeutic intervention in order to improve the following deficits and impairments:  Abnormal gait,Decreased activity tolerance,Decreased cognition,Decreased endurance,Decreased knowledge of use of DME,Decreased range of motion,Decreased skin integrity,Decreased strength,Impaired perceived functional ability,Impaired sensation,Impaired UE functional use,Improper body mechanics,Pain,Cardiopulmonary status limiting activity,Decreased balance,Decreased coordination,Decreased mobility,Difficulty walking,Impaired tone,Postural dysfunction,Dizziness  Visit Diagnosis: Other lack of  coordination  Hemiplegia and hemiparesis following cerebral infarction affecting left non-dominant side ([place])  Difficulty in walking, not elsewhere classified     Problem List Patient Active Problem List   Diagnosis Date Noted  . Sepsis ([place]) 10/18/2018    Lewis Moccasin, PT 05/06/2020, 6:44 AM  [place] MAIN [facility] SERVICES [address] [place], [place], [address] Phone: [phone]   Fax:  [phone]  Name: KAMARON DESKINS MRN: [id number] Date of Birth: 05-06-1945

## 2020-05-06 ENCOUNTER — Ambulatory Visit: Payer: Medicare Other

## 2020-05-06 ENCOUNTER — Encounter: Payer: Medicare Other | Admitting: Occupational Therapy

## 2020-05-07 ENCOUNTER — Encounter: Payer: Self-pay | Admitting: Occupational Therapy

## 2020-05-07 NOTE — Therapy (Signed)
[place] SERVICES [address] [place], [place], [address] Phone: [phone]   Fax:  [phone]  Occupational Therapy Treatment  Patient Details  Name: Kenneth Summers MRN: [id number] Date of Birth: Aug 28, 1945 No data recorded  Encounter Date: 05/05/2020   OT End of Session - 05/07/20 1903    Visit Number [id number]    Number of Visits 205    Date for OT Re-Evaluation 05/24/20    Authorization Type Progress report period starting 03/11/2020    OT Start Time 1345    OT Stop Time 1429    OT Time Calculation (min) 44 min    Activity Tolerance Patient tolerated treatment well    Behavior During Therapy [facility] for tasks assessed/performed           Past Medical History:  Diagnosis Date  . Actinic keratosis   . Alcohol abuse   . APS (antiphospholipid syndrome) ([place])   . Basal cell carcinoma   . BPH (benign prostatic hyperplasia)   . CAD (coronary artery disease)   . Cellulitis   . Cervical spinal stenosis   . Chronic osteomyelitis ([place])   . CKD (chronic kidney disease)   . CKD (chronic kidney disease)   . Clostridium difficile diarrhea   . Depression   . Diplopia   . Fatigue   . GERD (gastroesophageal reflux disease)   . Hyperlipemia   . Hypertension   . Hypovitaminosis D   . IBS (irritable bowel syndrome)   . 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 Insomnia   . Left lumbosacral radiculopathy   . Moderate obstructive sleep apnea   . Osteomyelitis of foot ([place])   . PAD (peripheral artery disease) ([place])   . Pruritus   . RA (rheumatoid arthritis) ([place])   . Radiculopathy   . Restless legs syndrome   . RLS (restless legs syndrome)   . SLE (systemic lupus erythematosus related syndrome) ([place])   . Squamous cell carcinoma of skin 01/27/2019   right mid lower ear helix  . Stroke ([place])   . Toxic maculopathy of both eyes     Past Surgical History:  Procedure Laterality Date  . CARDIAC CATHETERIZATION    . CAROTID ENDARTERECTOMY    .  CERVICAL LAMINECTOMY    . CORONARY ANGIOPLASTY    . FRACTURE SURGERY    . fusion C5-6-7    . HERNIA REPAIR    . KNEE ARTHROSCOPY    . TONSILLECTOMY      There were no vitals filed for this visit.   Subjective Assessment - 05/07/20 1901    Subjective  Caregiver with patient this date, reports they had to return the Lucianne Lei because he was too tall to fit into it and could not get another chair that would work.  They have rented a Lucianne Lei to use to go to his grandson's wedding soon.    Pertinent History Pt. is a 75 y.o. 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 Pt. PMHx includes: Multiple Falls, Lupus, DJD, Renal Abscess, CVA. Pt. resides with his wife. Pt.'s wife and daughter assist with ADLs. Pt. has caregivers in  for 2 hours a day, 6 days a week. Pt. received Rehab services in acute care, at SNF for STR, and [place] services. Pt. is retired from [employer] for [employer] and [employer].    Patient Stated Goals To regain use of his left UE, and do more for himself.  Currently in Pain? No/denies    Pain Score 0-No pain          Therapeutic Exercise:  Pt seen for UB strengthening with use of 2# dowel for shoulder flexion, ABD, ADD, chest press, forwards and backwards circles for 12 reps for 2 sets each. Therapist demo and cues for proper form and technique.  Multi directional reaching tasks with use of jumbo pegs from  Eunice board to place into board with cues for design copy.  Patient easily distracted if positioned near the rehab gym and when people are walking by.  Performs best with quiet space, minimal distractions and focus on task.    Response to tx: Patient easily distracted this date and had to be redirected to task several times.  Patient performs well with repetition of task and with therapist mirroring of task and cues for proper form and technique.  Fatigues quickly with dowel exercises and requires occasional rest breaks.  Pt continues to benefit  from skilled OT services to improve functional UE use into daily tasks.                      OT Education - 05/07/20 1903    Education Details strengthening, UE functioning    Person(s) Educated Patient    Methods Explanation;Demonstration    Comprehension Returned demonstration;Tactile cues required;Verbalized understanding;Need further instruction               OT Long Term Goals - 04/19/20 1353      OT LONG TERM GOAL #1   Title Pt. will increase left shoulder flexion AROM by 10 degrees to access cabinet/shelf.    Baseline Pt. continues to present with limited left shoulder ROM limitng access to cabinets, and shelves.03/01/2020: Left shoulder 116 degrees with new onset of numbness radiating from the left side of the neck to the hand. Pt. continues to progress with left shoulder ROM .Shouler flexion 130, and continues to work on reaching with increasing emphasis on flexing trunk to improve functional reach.  07/21/19- shoulder flexion to 130 with effort    Time 12    Period Weeks    Status On-going    Target Date 05/24/20      OT LONG TERM GOAL #2   Title Pt. will donn a shirt with Supervision.    Baseline Pt. continues to require modA donning a zip down jacket with consistent cues to initiate the correct direction of the jacket.  Min-modA mahaging the zipper. MinA for donning a T-shirt with assist to roll side to side to pull the shirt down over his back. 07/21/19 continues to require min assist at times for shirt/jacket    Time 12    Period Weeks    Status On-going    Target Date 05/24/20      OT LONG TERM GOAL #4   Title Pt. will improve left grip strength by 10# to be able to open a jar/container.    Baseline Grip strength conitnues to be limited. Decreased left sided grip strength today secondary to a new onset of numbenss, and weakness radiating from the neck to the left hand. Pt. continues to have a harder time opening a wide mouthed jar, however is able to  open a smaller jar easier.    Time 12    Period Weeks    Status On-going    Target Date 05/24/20      OT LONG TERM GOAL #5   Title Pt. will improve  left hand [facility] skills to be able to assist with buttoning/zipping.    Baseline Pt. conitnues to work on improving [facility] skills  to improve buttoning , and zipping.03/01/2020: Pt.has had a new onset of weakness, and numbness in the left hand beginning upon waking up this morning which has resulted in a significant change in [facility] skills.  Pt. has improved overall donning a jacket, and  manipulating the zipper on his jacket, however continues to require practice depending on the type of zipper jacket. Pt. continues to work on improving bilateral [facility] skills for buttoning/zipping.  07/21/19 improving with buttons and zippers.    Time 12    Period Weeks    Status On-going    Target Date 05/24/20      OT LONG TERM GOAL #6   Title Pt. will demonstrate visual conmpensatory strategies for 100% of the time during ADLs    Baseline Improving, however Pt. continues to require intermittent cues cues for left sides awareness.    Time 12    Period Weeks    Status On-going    Target Date 05/24/20      OT LONG TERM GOAL #8   Title Pt. will accurately identify potential safety hazard using good safety awareness, and judgement 100% for ADLs, and IADLs.    Baseline Pt. continues to require cues    Time 12    Period Weeks    Status On-going    Target Date 05/24/20      OT LONG TERM GOAL  #9   TITLE Pt. will  navigate his w/c around obstacles with Supervision and 100% accuracy    Baseline Pt. has progressed, however, continues to require cuing for obstacles on the left with fewer  cues overall. Pt. requires significant cues to engage his left UE during tasks. 07/21/2019 continues to improve with decreased assist at times    Time 12    Period Weeks    Status On-going    Target Date 05/24/20      OT LONG TERM GOAL  #10   TITLE Pt. will independently be able  to  reach to place items into cabinetry, and closets.    Baseline Pt. continues to have difficulty with functional reaching continues to be limited.    Time 12    Period Weeks    Status On-going    Target Date 05/24/20      OT LONG TERM GOAL  #11   TITLE Pt. will increase BUE strength by 75mm grades in preparation for ADL transfers.    Baseline Pt. is progressing, however BUE strength continues to be limited. Pt. is able to tolerate increased resistive weights. Pt. continues to work towards improving overall strength for ADLs.    Time 12    Period Weeks    Status On-going    Target Date 06/03/20      OT LONG TERM GOAL  #12   TITLE Pt. will improve LUE functional reaching with the left engaging the trunk 100% of the time during ADL tasks with minimal cues.    Baseline Left sided numbness has improved. New onset of left sided numbness, and weakness is affecting his ability to perform reaching.    Time 12    Period Weeks    Status On-going    Target Date 05/24/20                 Plan - 05/07/20 1904    Clinical Impression Statement Patient easily distracted this date  and had to be redirected to task several times.  Patient performs well with repetition of task and with therapist mirroring of task and cues for proper form and technique.  Fatigues quickly with dowel exercises and requires occasional rest breaks.  Pt continues to benefit from skilled OT services to improve functional UE use into daily tasks.    OT Occupational Profile and History Problem Focused Assessment - Including review of records relating to presenting problem    Occupational performance deficits (Please refer to evaluation for details): ADL's    Body Structure / Function / Physical Skills UE functional use;Coordination;FMC;Dexterity;Strength;ROM    Cognitive Skills Attention;Memory;Emotional;Problem Solve;Safety Awareness    Rehab Potential Good    Clinical Decision Making Several treatment options, min-mod task  modification necessary    Comorbidities Affecting Occupational Performance: Presence of comorbidities impacting occupational performance    Comorbidities impacting occupational performance description: Phyical, cognitive, visual,  medical comorbidities    Modification or Assistance to Complete Evaluation  No modification of tasks or assist necessary to complete eval    OT Frequency 2x / week    OT Duration 12 weeks    OT Treatment/Interventions Self-care/ADL training;DME and/or AE instruction;Therapeutic exercise;Therapeutic activities;Moist Heat;Cognitive remediation/compensation;Neuromuscular education;Visual/perceptual remediation/compensation;Coping strategies training;Patient/family education;Passive range of motion;Psychosocial skills training;Energy conservation;Functional Mobility Training    Consulted and Agree with Plan of Care Patient;Family member/caregiver           Patient will benefit from skilled therapeutic intervention in order to improve the following deficits and impairments:   Body Structure / Function / Physical Skills: UE functional use,Coordination,FMC,Dexterity,Strength,ROM Cognitive Skills: Attention,Memory,Emotional,Problem Solve,Safety Awareness     Visit Diagnosis: Muscle weakness (generalized)  Hemiplegia and hemiparesis following cerebral infarction affecting left non-dominant side (HCC)  Other lack of coordination    Problem List Patient Active Problem List   Diagnosis Date Noted  . Sepsis ([place]) 10/18/2018   Addalynn Kumari T Ignace Mandigo, OTR/L, CLT  Harika Laidlaw 05/07/2020, 7:13 PM  [place] [facility] SERVICES [address] [place], [place], [address] Phone: [phone]   Fax:  [phone]  Name: Kenneth Summers MRN: [id number] Date of Birth: 06-17-1945

## 2020-05-10 ENCOUNTER — Ambulatory Visit: Payer: Medicare Other | Admitting: Occupational Therapy

## 2020-05-10 ENCOUNTER — Encounter: Payer: Self-pay | Admitting: Occupational Therapy

## 2020-05-10 ENCOUNTER — Other Ambulatory Visit: Payer: Self-pay

## 2020-05-10 ENCOUNTER — Ambulatory Visit: Payer: Medicare Other | Attending: Family Medicine

## 2020-05-10 DIAGNOSIS — R2689 Other abnormalities of gait and mobility: Secondary | ICD-10-CM | POA: Insufficient documentation

## 2020-05-10 DIAGNOSIS — M6281 Muscle weakness (generalized): Secondary | ICD-10-CM | POA: Diagnosis not present

## 2020-05-10 DIAGNOSIS — R278 Other lack of coordination: Secondary | ICD-10-CM

## 2020-05-10 DIAGNOSIS — R262 Difficulty in walking, not elsewhere classified: Secondary | ICD-10-CM | POA: Insufficient documentation

## 2020-05-10 DIAGNOSIS — R269 Unspecified abnormalities of gait and mobility: Secondary | ICD-10-CM | POA: Diagnosis present

## 2020-05-10 DIAGNOSIS — I69354 Hemiplegia and hemiparesis following cerebral infarction affecting left non-dominant side: Secondary | ICD-10-CM | POA: Diagnosis present

## 2020-05-10 NOTE — Therapy (Signed)
[place] MAIN [facility] SERVICES [address] [place], [place], [address] Phone: [phone]   Fax:  [phone]  Physical Therapy Treatment  Patient Details  Name: Kenneth Summers MRN: [id number] Date of Birth: 1945/06/21 No data recorded  Encounter Date: 05/10/2020   PT End of Session - 05/10/20 1405    Visit Number [id number]    Number of Visits 276    Date for PT Re-Evaluation 07/12/20    Authorization Type Medicare reporting period starting 09/11/18; NO FOTO    Authorization Time Period Recert 0/53/9767-04/10/1935    PT Start Time 1259    PT Stop Time 1344    PT Time Calculation (min) 45 min    Equipment Utilized During Treatment Gait belt    Activity Tolerance No increased pain;Patient limited by fatigue;Patient tolerated treatment well    Behavior During Therapy [facility] for tasks assessed/performed           Past Medical History:  Diagnosis Date  . Actinic keratosis   . Alcohol abuse   . APS (antiphospholipid syndrome) ([place])   . Basal cell carcinoma   . BPH (benign prostatic hyperplasia)   . CAD (coronary artery disease)   . Cellulitis   . Cervical spinal stenosis   . Chronic osteomyelitis ([place])   . CKD (chronic kidney disease)   . CKD (chronic kidney disease)   . Clostridium difficile diarrhea   . Depression   . Diplopia   . Fatigue   . GERD (gastroesophageal reflux disease)   . Hyperlipemia   . Hypertension   . Hypovitaminosis D   . IBS (irritable bowel syndrome)   . 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 Insomnia   . Left lumbosacral radiculopathy   . Moderate obstructive sleep apnea   . Osteomyelitis of foot ([place])   . PAD (peripheral artery disease) ([place])   . Pruritus   . RA (rheumatoid arthritis) ([place])   . Radiculopathy   . Restless legs syndrome   . RLS (restless legs syndrome)   . SLE (systemic lupus erythematosus related syndrome) ([place])   . Squamous cell carcinoma of skin 01/27/2019   right mid lower ear helix  . Stroke ([place])    . Toxic maculopathy of both eyes     Past Surgical History:  Procedure Laterality Date  . CARDIAC CATHETERIZATION    . CAROTID ENDARTERECTOMY    . CERVICAL LAMINECTOMY    . CORONARY ANGIOPLASTY    . FRACTURE SURGERY    . fusion C5-6-7    . HERNIA REPAIR    . KNEE ARTHROSCOPY    . TONSILLECTOMY      There were no vitals filed for this visit.   Subjective Assessment - 05/10/20 1303    Subjective Patient reports feeling pretty good today with no new complaints.    Patient is accompained by: Family member    Pertinent History Patient is a 75 y.o. male who presents to outpatient physical therapy with a referral for medical diagnosis of CVA. This patient's chief complaints consist of left hemiplegia and overall deconditioning leading to the following functional deficits: dependent for ADLs, IADLs, unable to transfer to car, dependent transfers at home with hoyer lift, unable to walk or stand without significant assistance, difficulty with W/C navigation..    Limitations Sitting;Lifting;Standing;House hold activities;Writing;Walking;Other (comment)    Diagnostic tests Brain MRI 11/01/2017: IMPRESSION: 1. Acute right ACA territory infarct as demonstrated on prior CT imaging. No intracranial hemorrhage or significant mass effect. 2. 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    Patient Stated Goals wants to be able to walk again    Currently in Pain? No/denies    Pain Onset Today    Pain Onset Today           Interventions:   Therapeutic exercises:  Manual HS stretching on left - Hold 30 sec x 3 sets (Patient presents with increased twitching and tightness)   Seated posture- upright with dynamic boxing - jabs and cross swing x 1 min each x 3 sets.   Standing trials 1) 45 sec- Assist to keep trunk extended - less Assistance overall- More Mod than Max today.   2) 56 sec-  Less overall cues for erect standing.  3) 1 min 43 sec- Patient able to achieve  more erect standing posture- Raised Height of bars to level 10 and patient improved.  4)  44 sec-  Patient able to dynamically weight shift more to right side with mod physical assist.  5) 52 sec 6) 50 sec   Clinical Impression: Patient was able to perform sit to stand with max assist yet much less overall physical assist from PT than in previous visits. He was able to scoot to edge better and push up with less overall cues today. Overall, He demonstrated more consistent times with Standing to progress his functional LE strength. He will continue to benefit from skilled PT services to improve his functional strength and postural control for improved transfers/standing endurance                           PT Education - 05/10/20 1402    Education Details Postural cues with sitting/standing.    Person(s) Educated Patient    Methods Explanation;Demonstration;Tactile cues;Verbal cues    Comprehension Verbalized understanding;Returned demonstration;Verbal cues required;Tactile cues required;Need further instruction            PT Short Term Goals - 04/19/20 2317      PT SHORT TERM GOAL #1   Title Be independent with initial home exercise program for self-management of symptoms.    Baseline HEP to be given at second session; advice to practice unsupported sitting at home (06/19/2018); 07/08/18: Performing at home, 6/29: needs assistance but is doing exercise program regularly; 08/22/18: adherent;    Time 2    Period Weeks    Status Achieved    Target Date 06/26/18      PT SHORT TERM GOAL #2   Title Patient will perform sit to stand transfer with mod Assist in parallel bars for improved transfer ability with decreased caregiver assist.    Baseline 04/19/2020- Max assist of 1 person    Time 6    Period Weeks    Status New    Target Date 05/31/20             PT Long Term Goals - 04/19/20 1321      PT LONG TERM GOAL #1   Title Patient will complete all bed mobility  with min A to improve functional independence for getting in and out of bed and adjusting in bed.     Baseline max A - total A reported by family (06/19/2018); 07/08/18: still requires maxA, dtr reports improvement in rolling since starting therapy; 6/23: min A to supervision in clinic; not doing at home right; 8/5: Pt's daughter states that his rolling has improved, but still has difficulty with all other bed mobility;  10/23/18: pts daughter states that she is doing about 35% of the work if he is in proper position, he does uses the bed rails to help roll;  using Harrel Lemon for coming to sit EOB; 10/12: pts daughter states that she is doing about 30% of the work if he is in proper position, he does uses the bed rails to help roll, but it is improving;  using Harrel Lemon for coming to sit EOB, 10/29: min A for sit to sidelying, rolling supervision, able to transition sidelying to sit with HHA; 12/21: min-mod A. 03/01/2020- Patient presents improving bed mobility with current ability to roll to left with CGA and verbal and tactile cues and min assist to roll to right as seen by recent visits. 03/31/2020- Patient and caregiver report that he has maintained ability to roll side to side with only minimal assistance.    Time 12    Period Weeks    Status Achieved      PT LONG TERM GOAL #2   Title Patient will complete sit <> stand transfer chair to chair with Min A and LRAD to improve functional independence for household and community mobility.     Baseline min A for STS, still requires elevated surface height to 27".  03/01/20- Patient currently unable to perform a chair to chair transfer and continues to be transferred using hoyer lift. Patient has been training with sit to stand at parallel bars and able to currently perform sit to stand with max assist  from 27in. high surface.03/31/2020- Patient remains at [facility] assist with sit to stand transfer and continued use of hoyer lift transfer for all chair to chair or bed to chair  transfers. 04/19/2020- Patient requires Max assist with sit to stand transfers and requires hoyer for chair to chair transfers.    Time 12    Period Weeks    Status On-going    Target Date 07/12/20      PT LONG TERM GOAL #3   Title Patient will navigate power w/c with min A x 100 feet to improve mobility for household and short community distances.    Baseline required total A (06/12/2018); able to roll 20 feet with min A (06/20/2018); 07/08/18: Pt can go approximately 10' without assist per family;  10/23/2018: pt's daughter states that his power WC mobility has become 100% better with the new hand control, slowed speed, and with practice; 10/12: pt's daughter reports that he can perform household WC mobility and limited community ambulation with minA    Time 12    Period Weeks    Status Achieved      PT LONG TERM GOAL #4   Title Patient will complete car transfers W/C to family SUV with min A using LRAD to improve his abilty to participate in community activities.     Baseline unable to complete car transfers at all (06/12/2018); unable to complete car transfers at all (06/19/2018).; 07/08/18: Unable to perform at this time; 09/11/18: unable to perform at this time; 10/23/2018: unable to complete car transfers at all; 10/12:  unable to complete car transfers at all, 10/29: unable; 01/13/19: unable; 2/11: min-mod A +2, 3/10: min A+1, 12/21: mod A. 03/01/2020- Patient unable to complete car transfers and continues to require medical transportation at this time. 3/14- Patient reports he may be purchasing a conversion van later this week.    Time 12    Period Weeks    Status Deferred      PT LONG TERM GOAL #  5   Title Patient will ambulate at least 10 feet with min A using LRAD to improve mobility for household and community distances.    Baseline unable to take steps (06/12/2018); able to shift R leg forward and back with max A and RW at edge of plinth (06/19/2018); 07/08/18: unable to ambulate at this time. 09/11/18:  unable to perform at this time; 10/23/2018: unable to perform at this time; 10/12: unable to perform at this time, 10/29: unable at this time; 01/13/19: unable at this time, 2/11: min A to step with RLE, unable to step with LLE, 3/10: able to take 2 steps in parallel bars with min A for safety; 4/1: min A for walking 10 feet in parallel bars, 5/24: min A walking 10 feet in parallel bars; 6/21: no change 7/14: no change. 03/01/2020- Patient unable to ambulate and currently focusing on static standing. He was able to take a forward step with his right LE last week but unable today. 03/31/2020- Patient continues to be non-ambulatory but has been able to move right LE forward with standing but unable to take a step with left LE. 04/19/2020- Goal is still not appropriate as of yet- cont to focus on static and dynamic standing balance/pregait activities.    Time 12    Period Weeks    Status Deferred    Target Date 07/12/20      PT LONG TERM GOAL #6   Title Patient will be able to safety navigate up/down ramp with power chair, exhibiting good safety awareness, mod I to safely enter/exit his home.    Baseline 2/11: supervision with intermittent min A;4/1: supervision; 5/24: supervision. 03/01/2020- patient reports that he is able to navigate down the ramp at home to leave the home but continues to need assist to navigate up the ramp into the home.    Time 12    Period Weeks    Status Partially Met      PT LONG TERM GOAL #7   Title Patient will increase functional reach test to >15 inches in sitting to exhibit improved sitting balance and positioning and reduce fall risk;    Baseline 07/30/18: 12 inches; 09/11/18: 10-12 inches; 10/23/18: 12-13 inch; 10/12: 14.5", 10/29: 15.5 inch    Time 4    Period Weeks    Status Achieved      PT LONG TERM GOAL #8   Title Patient will increase LLE quad strength to 3+/5 to improve functional strength for standing and mobility;    Baseline 10/28: 2+/5; 01/13/19: 2+/5, 2/11:  3-/5, 04/16/19: 3-/5, 05/08/19: 3-/5, 5/24: 3-/5, 6/21: 3-/5, 7/14: 3-/5, 12/21: 3-/5. 03/01/2020- Left quad= 3-/5 (focusing on quad strengthening during visits and with home program). 03/31/2020- left quad = 3-/5 with MMT. 04/19/2020= 3- /5 with left quad strength with manual muscle testing.    Time 12    Period Weeks    Status On-going    Target Date 07/12/20      PT LONG TERM GOAL  #9   TITLE Patient will report no vertigo with provoking motions or positions in order to be able to perform ADLs and mobility tasks without symptoms.    Baseline 06/30/19: reports no dizziness in last week; 6/21: one episode this weekend; 7/14: experiencing one a week; 03/01/2020.- Patient has denied any dizziness for over past 3 weeks.    Time 12    Period Weeks    Status Achieved      PT LONG TERM GOAL  #10  TITLE Patient will demonstrate improved functional LE/Postural strength as seen by ability to perform static standing for 2 min or greater with max assist  to assist with strength required for ADL's and transfers/pregait/gait activities.    Baseline 03/01/2020- Patient able to stand approx 40 sec today with max assist in parallel bars. 03/31/2020- Patient able to demo static stand with max A for 67mn 47 sec today. 04/19/2020- Patient able to demo 2 min today with mod/max assist - yet remains inconsistent.    Time 8    Period Weeks    Status Partially Met    Target Date 07/12/20                 Plan - 05/10/20 1358    Clinical Impression Statement Patient was able to perform sit to stand with max assist yet much less overall physical assist from PT than in previous visits. He was able to scoot to edge better and push up with less overall cues today. Overall, He demonstrated more consistent times with Standing to progress his functional LE strength. He will continue to benefit from skilled PT services to improve his functional strength and postural control for improved transfers/standing endurance     Personal Factors and Comorbidities Age;Comorbidity 3+    Comorbidities Relevant past medical history and comorbidities include long term steroid use for lupus, CKD, chronic osteomyelitis, cervical spine stenosis, BPH, APS, alcohol abuse, peripheral artery disease, depression, GERD, obstructive sleep apnea, HTN, IBS, IgA deficiency, lumbar radiculopathy, RA, Stroke, toxic maculopathy in both eyes, systemic lupus erythematosus related syndrome, cardiac catheterization, carotid endarterectomy, cervical laminectomy, coronary angioplasty, Fusion C5-C7, knee arthroplasty, lumbar surgery.    Examination-Activity Limitations Bed Mobility;Bend;Sit;Toileting;Stand;Stairs;Lift;Transfers;Squat;Locomotion Level;Carry;Dressing;Hygiene/Grooming;Continence    Examination-Participation Restrictions Yard Work;Interpersonal Relationship;Community Activity    Stability/Clinical Decision Making Evolving/Moderate complexity    Rehab Potential Fair    PT Frequency 2x / week    PT Duration 12 weeks    PT Treatment/Interventions ADLs/Self Care Home Management;Electrical Stimulation;Moist Heat;Cryotherapy;Gait training;Stair training;Functional mobility training;Therapeutic exercise;Balance training;Neuromuscular re-education;Cognitive remediation;Patient/family education;Orthotic Fit/Training;Wheelchair mobility training;Manual techniques;Passive range of motion;Energy conservation;Joint Manipulations;Canalith Repostioning;Therapeutic activities;Vestibular    PT Next Visit Plan Continue with progressive postural/LE strengthening/pregait activities    PT Home Exercise Plan no changes    Consulted and Agree with Plan of Care Patient;Family member/caregiver    Family Member Consulted Caregiver- CSeibert          Patient will benefit from skilled therapeutic intervention in order to improve the following deficits and impairments:  Abnormal gait,Decreased activity tolerance,Decreased cognition,Decreased endurance,Decreased  knowledge of use of DME,Decreased range of motion,Decreased skin integrity,Decreased strength,Impaired perceived functional ability,Impaired sensation,Impaired UE functional use,Improper body mechanics,Pain,Cardiopulmonary status limiting activity,Decreased balance,Decreased coordination,Decreased mobility,Difficulty walking,Impaired tone,Postural dysfunction,Dizziness  Visit Diagnosis: Muscle weakness (generalized)  Hemiplegia and hemiparesis following cerebral infarction affecting left non-dominant side ([place]  Other lack of coordination  Difficulty in walking, not elsewhere classified     Problem List Patient Active Problem List   Diagnosis Date Noted  . Sepsis ([place] 10/18/2018    JLewis Moccasin PT 05/10/2020, 2:09 PM  [place] [facility] [address] [address]Phone: [phone]  Fax:  [phone] Name: JTAREZ BOWNSMRN: [id number] of Birth: 106/06/1945

## 2020-05-10 NOTE — Therapy (Addendum)
[place] MAIN [facility] SERVICES [address] [place], [place], [address] Phone: [phone]   Fax:  [phone]  Occupational Therapy Treatment  Patient Details  Name: Kenneth Summers MRN: [id number] Date of Birth: 1945-12-25 No data recorded  Encounter Date: 05/10/2020   OT End of Session - 05/10/20 1355    Visit Number [id number]    Number of Visits 229    Date for OT Re-Evaluation 05/24/20    Authorization Type Progress report period starting 03/11/2020    OT Start Time 1349    OT Stop Time 1430    OT Time Calculation (min) 41 min    Activity Tolerance Patient tolerated treatment well    Behavior During Therapy [facility] for tasks assessed/performed           Past Medical History:  Diagnosis Date  . Actinic keratosis   . Alcohol abuse   . APS (antiphospholipid syndrome) ([place])   . Basal cell carcinoma   . BPH (benign prostatic hyperplasia)   . CAD (coronary artery disease)   . Cellulitis   . Cervical spinal stenosis   . Chronic osteomyelitis ([place])   . CKD (chronic kidney disease)   . CKD (chronic kidney disease)   . Clostridium difficile diarrhea   . Depression   . Diplopia   . Fatigue   . GERD (gastroesophageal reflux disease)   . Hyperlipemia   . Hypertension   . Hypovitaminosis D   . IBS (irritable bowel syndrome)   . 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 Insomnia   . Left lumbosacral radiculopathy   . Moderate obstructive sleep apnea   . Osteomyelitis of foot ([place])   . PAD (peripheral artery disease) ([place])   . Pruritus   . RA (rheumatoid arthritis) ([place])   . Radiculopathy   . Restless legs syndrome   . RLS (restless legs syndrome)   . SLE (systemic lupus erythematosus related syndrome) ([place])   . Squamous cell carcinoma of skin 01/27/2019   right mid lower ear helix  . Stroke ([place])   . Toxic maculopathy of both eyes     Past Surgical History:  Procedure Laterality Date  . CARDIAC CATHETERIZATION    . CAROTID ENDARTERECTOMY    .  CERVICAL LAMINECTOMY    . CORONARY ANGIOPLASTY    . FRACTURE SURGERY    . fusion C5-6-7    . HERNIA REPAIR    . KNEE ARTHROSCOPY    . TONSILLECTOMY      There were no vitals filed for this visit.   Subjective Assessment - 05/10/20 1352    Subjective  Pt. reports that he was unable to get in, and out of the [facility] so they had to return it.    Patient is accompanied by: Family member    Pertinent History Pt. is a 75 y.o. 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 Pt. PMHx includes: Multiple Falls, Lupus, DJD, Renal Abscess, CVA. Pt. resides with his wife. Pt.'s wife and daughter assist with ADLs. Pt. has caregivers in  for 2 hours a day, 6 days a week. Pt. received Rehab services in acute care, at SNF for STR, and [place] services. Pt. is retired from [employer] for [employer] and [employer].    Currently in Pain? No/denies           OT TREATMENT   Therapeutic Exercise:  Pt. performed2.5#dowelfor UE strengthening secondary to weakness.Pt.tolerated increased dowel weight.Bilateral shoulder flexion, chest  press,andcircular patterns,were performedfor1-2 sets10-20reps each with cues to keep the dowel symmetrical, and level. Pt. worked on4# dumbbell ex. forrightelbow flexion and extension,supination/pronation, wrist extension, and radial deviationwith 4# for the RUE, 3# with the LUE for elbow flexion, extension, and supination/pronation, wrsit extension.Tactile and verbal cues were required for eliciting the desired movement, and correct hand position. Pt. Worked out on the [place] for 8 min. In forward motion.  Pt.continuestomakesteadyprogress with BUE strength, and tolerance for there.ex. Pt.continues torequiredless cues todayfor movement patterns, however required cues for visual demonstration.Pt.continues to require consistent verbal, visual, and tactile cues.Pt.didrequire cues for radial deviation.Pt.continues  torequire less cuesto perform UE strengthening exercises, technique, and formas well as visual demonstration prior to the exercises.Pt. continues to work on improving BUE strength, and [facility] skills in order to improve LUE functioning during ADLs, IADLs, and tranfers.                        OT Education - 05/10/20 1355    Education Details strengthening, UE functioning    Person(s) Educated Patient    Methods Explanation;Demonstration    Comprehension Returned demonstration;Tactile cues required;Verbalized understanding;Need further instruction               OT Long Term Goals - 04/19/20 1353      OT LONG TERM GOAL #1   Title Pt. will increase left shoulder flexion AROM by 10 degrees to access cabinet/shelf.    Baseline Pt. continues to present with limited left shoulder ROM limitng access to cabinets, and shelves.03/01/2020: Left shoulder 116 degrees with new onset of numbness radiating from the left side of the neck to the hand. Pt. continues to progress with left shoulder ROM .Shouler flexion 130, and continues to work on reaching with increasing emphasis on flexing trunk to improve functional reach.  07/21/19- shoulder flexion to 130 with effort    Time 12    Period Weeks    Status On-going    Target Date 05/24/20      OT LONG TERM GOAL #2   Title Pt. will donn a shirt with Supervision.    Baseline Pt. continues to require modA donning a zip down jacket with consistent cues to initiate the correct direction of the jacket.  Min-modA mahaging the zipper. MinA for donning a T-shirt with assist to roll side to side to pull the shirt down over his back. 07/21/19 continues to require min assist at times for shirt/jacket    Time 12    Period Weeks    Status On-going    Target Date 05/24/20      OT LONG TERM GOAL #4   Title Pt. will improve left grip strength by 10# to be able to open a jar/container.    Baseline Grip strength conitnues to be limited. Decreased  left sided grip strength today secondary to a new onset of numbenss, and weakness radiating from the neck to the left hand. Pt. continues to have a harder time opening a wide mouthed jar, however is able to open a smaller jar easier.    Time 12    Period Weeks    Status On-going    Target Date 05/24/20      OT LONG TERM GOAL #5   Title Pt. will improve left hand [facility] skills to be able to assist with buttoning/zipping.    Baseline Pt. conitnues to work on improving [facility] skills  to improve buttoning , and zipping.03/01/2020: Pt.has had a new onset  of weakness, and numbness in the left hand beginning upon waking up this morning which has resulted in a significant change in [facility] skills.  Pt. has improved overall donning a jacket, and  manipulating the zipper on his jacket, however continues to require practice depending on the type of zipper jacket. Pt. continues to work on improving bilateral [facility] skills for buttoning/zipping.  07/21/19 improving with buttons and zippers.    Time 12    Period Weeks    Status On-going    Target Date 05/24/20      OT LONG TERM GOAL #6   Title Pt. will demonstrate visual conmpensatory strategies for 100% of the time during ADLs    Baseline Improving, however Pt. continues to require intermittent cues cues for left sides awareness.    Time 12    Period Weeks    Status On-going    Target Date 05/24/20      OT LONG TERM GOAL #8   Title Pt. will accurately identify potential safety hazard using good safety awareness, and judgement 100% for ADLs, and IADLs.    Baseline Pt. continues to require cues    Time 12    Period Weeks    Status On-going    Target Date 05/24/20      OT LONG TERM GOAL  #9   TITLE Pt. will  navigate his w/c around obstacles with Supervision and 100% accuracy    Baseline Pt. has progressed, however, continues to require cuing for obstacles on the left with fewer  cues overall. Pt. requires significant cues to engage his left UE during tasks.  07/21/2019 continues to improve with decreased assist at times    Time 12    Period Weeks    Status On-going    Target Date 05/24/20      OT LONG TERM GOAL  #10   TITLE Pt. will independently be able  to reach to place items into cabinetry, and closets.    Baseline Pt. continues to have difficulty with functional reaching continues to be limited.    Time 12    Period Weeks    Status On-going    Target Date 05/24/20      OT LONG TERM GOAL  #11   TITLE Pt. will increase BUE strength by 30mm grades in preparation for ADL transfers.    Baseline Pt. is progressing, however BUE strength continues to be limited. Pt. is able to tolerate increased resistive weights. Pt. continues to work towards improving overall strength for ADLs.    Time 12    Period Weeks    Status On-going    Target Date 06/03/20      OT LONG TERM GOAL  #12   TITLE Pt. will improve LUE functional reaching with the left engaging the trunk 100% of the time during ADL tasks with minimal cues.    Baseline Left sided numbness has improved. New onset of left sided numbness, and weakness is affecting his ability to perform reaching.    Time 12    Period Weeks    Status On-going    Target Date 05/24/20                 Plan - 05/10/20 1359    Clinical Impression Statement Pt.continuestomakesteadyprogress with BUE strength, and tolerance for there.ex. Pt.continues torequiredless cues todayfor movement patterns, however required cues for visual demonstration.Pt.continues to require consistent verbal, visual, and tactile cues.Pt.didrequire cues for radial deviation.Pt.continues torequire less cuesto perform UE strengthening exercises, technique,  and formas well as visual demonstration prior to the exercises.Pt. continues to work on improving BUE strength, and [facility] skills in order to improve LUE functioning during ADLs, IADLs, and tranfers.   OT Occupational Profile and History Problem Focused Assessment -  Including review of records relating to presenting problem    Occupational performance deficits (Please refer to evaluation for details): ADL's    Body Structure / Function / Physical Skills UE functional use;Coordination;FMC;Dexterity;Strength;ROM    Cognitive Skills Attention;Memory;Emotional;Problem Solve;Safety Awareness    Rehab Potential Good    Clinical Decision Making Several treatment options, min-mod task modification necessary    Comorbidities Affecting Occupational Performance: Presence of comorbidities impacting occupational performance    Comorbidities impacting occupational performance description: Phyical, cognitive, visual,  medical comorbidities    Modification or Assistance to Complete Evaluation  No modification of tasks or assist necessary to complete eval    OT Frequency 2x / week    OT Duration 12 weeks    OT Treatment/Interventions Self-care/ADL training;DME and/or AE instruction;Therapeutic exercise;Therapeutic activities;Moist Heat;Cognitive remediation/compensation;Neuromuscular education;Visual/perceptual remediation/compensation;Coping strategies training;Patient/family education;Passive range of motion;Psychosocial skills training;Energy conservation;Functional Mobility Training    Recommended Other Services PT    Consulted and Agree with Plan of Care Patient;Family member/caregiver           Patient will benefit from skilled therapeutic intervention in order to improve the following deficits and impairments:   Body Structure / Function / Physical Skills: UE functional use,Coordination,FMC,Dexterity,Strength,ROM Cognitive Skills: Attention,Memory,Emotional,Problem Solve,Safety Awareness     Visit Diagnosis: Muscle weakness (generalized)  Other lack of coordination    Problem List Patient Active Problem List   Diagnosis Date Noted  . Sepsis ([place]) 10/18/2018    Harrel Carina, MS, OTR/L 05/10/2020, 2:05 PM  [place] MAIN [facility] SERVICES [address] [place], [place], [address] Phone: [phone]   Fax:  [phone]  Name: Kenneth Summers MRN: [id number] Date of Birth: 22-Sep-1945

## 2020-05-12 ENCOUNTER — Ambulatory Visit: Payer: Medicare Other

## 2020-05-12 ENCOUNTER — Ambulatory Visit: Payer: Medicare Other | Admitting: Occupational Therapy

## 2020-05-13 ENCOUNTER — Ambulatory Visit: Payer: Medicare Other

## 2020-05-13 ENCOUNTER — Encounter: Payer: Medicare Other | Admitting: Occupational Therapy

## 2020-05-17 ENCOUNTER — Ambulatory Visit: Payer: Medicare Other

## 2020-05-17 ENCOUNTER — Ambulatory Visit: Payer: Medicare Other | Admitting: Occupational Therapy

## 2020-05-17 ENCOUNTER — Encounter: Payer: Self-pay | Admitting: Occupational Therapy

## 2020-05-17 ENCOUNTER — Other Ambulatory Visit: Payer: Self-pay

## 2020-05-17 DIAGNOSIS — M6281 Muscle weakness (generalized): Secondary | ICD-10-CM | POA: Diagnosis not present

## 2020-05-17 DIAGNOSIS — R269 Unspecified abnormalities of gait and mobility: Secondary | ICD-10-CM

## 2020-05-17 DIAGNOSIS — R278 Other lack of coordination: Secondary | ICD-10-CM

## 2020-05-17 DIAGNOSIS — R262 Difficulty in walking, not elsewhere classified: Secondary | ICD-10-CM

## 2020-05-17 DIAGNOSIS — R2689 Other abnormalities of gait and mobility: Secondary | ICD-10-CM

## 2020-05-17 NOTE — Therapy (Signed)
[place] MAIN [facility] SERVICES [address] Kenneth Sum[place], [place], [address] Phone: [phone]   Fax:  [phone]  Occupational Therapy Treatment  Patient Details  Name: Kenneth Summers MRN: [id number] Date of Birth: 1945/05/16 No data recorded  Encounter Date: 05/17/2020   OT End of Session - 05/17/20 1357    Visit Number [id number]    Number of Visits 229    Date for OT Re-Evaluation 05/24/20    Authorization Type Progress report period starting 03/11/2020    OT Start Time 1350    OT Stop Time 1430    OT Time Calculation (min) 40 min    Activity Tolerance Patient tolerated treatment well    Behavior During Therapy [facility] for tasks assessed/performed           Past Medical History:  Diagnosis Date  . Actinic keratosis   . Alcohol abuse   . APS (antiphospholipid syndrome) ([place])   . Basal cell carcinoma   . BPH (benign prostatic hyperplasia)   . CAD (coronary artery disease)   . Cellulitis   . Cervical spinal stenosis   . Chronic osteomyelitis ([place])   . CKD (chronic kidney disease)   . CKD (chronic kidney disease)   . Clostridium difficile diarrhea   . Depression   . Diplopia   . Fatigue   . GERD (gastroesophageal reflux disease)   . Hyperlipemia   . Hypertension   . Hypovitaminosis D   . IBS (irritable bowel syndrome)   . IgA deficiency ([place] Sum[place])   . Insomnia   . Left lumbosacral radiculopathy   . Moderate obstructive sleep apnea   . Osteomyelitis of foot ([place] Summers)   . PAD (peripheral artery disease) ([place]eth Summers)   . Pruritus   . RA (rheumatoid arthritis) ([place]eth Sum[place])   . Radiculopathy   . Restless legs syndrome   . RLS (restless legs syndrome)   . SLE (systemic lupus erythematosus related syndrome) ([place]eth Summers)   . Squamous cell carcinoma of skin 01/27/2019   right mid lower ear helix  . Stroke ([place]eth Summers)   . Toxic maculopathy of both eyes     Past Surgical History:  Procedure Laterality Date  . CARDIAC CATHETERIZATION    . CAROTID ENDARTERECTOMY    .  CERVICAL LAMINECTOMY    . CORONARY ANGIOPLASTY    . FRACTURE SURGERY    . fusion C5-6-7    . HERNIA REPAIR    . KNEE ARTHROSCOPY    . TONSILLECTOMY      There were no vitals filed for this visit.   Subjective Assessment - 05/17/20 1354    Subjective  Pt. attended his grandson's wedding this past weekend.    Patient is accompanied by: Family member    Pertinent History Pt. is a 75 y.o. 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 Pt. PMHx includes: Multiple Falls, Lupus, DJD, Renal Abscess, CVA. Pt. resides with his wife. Pt.'s wife and daughter assist with ADLs. Pt. has caregivers in  for 2 hours a day, 6 days a week. Pt. received Rehab services in acute care, at SNF for STR, and [place] services. Pt. is retired from [employer] for [employer] and [employer].    Currently in Pain? No/denies            OT TREATMENT   Therapeutic Exercise:  Pt. performed2.5#dowelfor UE strengthening secondary to weakness.Pt.tolerated increased dowel weight.Bilateral shoulder flexion, chest press,andcircular patterns,were performedfor1-2 sets10-20reps eachwith cues to keep the dowel symmetrical,  and level.Pt. worked on4# dumbbell ex. forrightelbow flexion and extension,supination/pronation, wrist extension, and radial deviationwith 4# for the RUE, 2# with the LUE for elbow flexion, extension, and supination/pronation, wrist extension.  Pt.continuestomakesteadyprogress with BUE strength, and tolerance for there.ex. Pt.continues torequiredfrequent verbal cues, and cues for visual demonstrationfor movement patterns, however required cues for visual demonstration.Pt.continues to require consistent verbal, visual, and tactile cues.Pt.continues to require cues for radial deviation.Pt.continues torequire less cuesto perform UE strengthening exercises, technique, and formas well as visual demonstration prior to the exercises.Pt.  continues to work on improving BUE strength, and [facility] skills in order to improve LUE functioning during ADLs, IADLs, and tranfers.                      OT Education - 05/17/20 1357    Education Details strengthening, UE functioning    Person(s) Educated Patient    Methods Explanation;Demonstration    Comprehension Returned demonstration;Tactile cues required;Verbalized understanding;Need further instruction               OT Long Term Goals - 04/19/20 1353      OT LONG TERM GOAL #1   Title Pt. will increase left shoulder flexion AROM by 10 degrees to access cabinet/shelf.    Baseline Pt. continues to present with limited left shoulder ROM limitng access to cabinets, and shelves.03/01/2020: Left shoulder 116 degrees with new onset of numbness radiating from the left side of the neck to the hand. Pt. continues to progress with left shoulder ROM .Shouler flexion 130, and continues to work on reaching with increasing emphasis on flexing trunk to improve functional reach.  07/21/19- shoulder flexion to 130 with effort    Time 12    Period Weeks    Status On-going    Target Date 05/24/20      OT LONG TERM GOAL #2   Title Pt. will donn a shirt with Supervision.    Baseline Pt. continues to require modA donning a zip down jacket with consistent cues to initiate the correct direction of the jacket.  Min-modA mahaging the zipper. MinA for donning a T-shirt with assist to roll side to side to pull the shirt down over his back. 07/21/19 continues to require min assist at times for shirt/jacket    Time 12    Period Weeks    Status On-going    Target Date 05/24/20      OT LONG TERM GOAL #4   Title Pt. will improve left grip strength by 10# to be able to open a jar/container.    Baseline Grip strength conitnues to be limited. Decreased left sided grip strength today secondary to a new onset of numbenss, and weakness radiating from the neck to the left hand. Pt. continues to have a  harder time opening a wide mouthed jar, however is able to open a smaller jar easier.    Time 12    Period Weeks    Status On-going    Target Date 05/24/20      OT LONG TERM GOAL #5   Title Pt. will improve left hand [facility] skills to be able to assist with buttoning/zipping.    Baseline Pt. conitnues to work on improving [facility] skills  to improve buttoning , and zipping.03/01/2020: Pt.has had a new onset of weakness, and numbness in the left hand beginning upon waking up this morning which has resulted in a significant change in [facility] skills.  Pt. has improved overall donning a jacket, and  manipulating  the zipper on his jacket, however continues to require practice depending on the type of zipper jacket. Pt. continues to work on improving bilateral [facility] skills for buttoning/zipping.  07/21/19 improving with buttons and zippers.    Time 12    Period Weeks    Status On-going    Target Date 05/24/20      OT LONG TERM GOAL #6   Title Pt. will demonstrate visual conmpensatory strategies for 100% of the time during ADLs    Baseline Improving, however Pt. continues to require intermittent cues cues for left sides awareness.    Time 12    Period Weeks    Status On-going    Target Date 05/24/20      OT LONG TERM GOAL #8   Title Pt. will accurately identify potential safety hazard using good safety awareness, and judgement 100% for ADLs, and IADLs.    Baseline Pt. continues to require cues    Time 12    Period Weeks    Status On-going    Target Date 05/24/20      OT LONG TERM GOAL  #9   TITLE Pt. will  navigate his w/c around obstacles with Supervision and 100% accuracy    Baseline Pt. has progressed, however, continues to require cuing for obstacles on the left with fewer  cues overall. Pt. requires significant cues to engage his left UE during tasks. 07/21/2019 continues to improve with decreased assist at times    Time 12    Period Weeks    Status On-going    Target Date 05/24/20      OT LONG  TERM GOAL  #10   TITLE Pt. will independently be able  to reach to place items into cabinetry, and closets.    Baseline Pt. continues to have difficulty with functional reaching continues to be limited.    Time 12    Period Weeks    Status On-going    Target Date 05/24/20      OT LONG TERM GOAL  #11   TITLE Pt. will increase BUE strength by 106mm grades in preparation for ADL transfers.    Baseline Pt. is progressing, however BUE strength continues to be limited. Pt. is able to tolerate increased resistive weights. Pt. continues to work towards improving overall strength for ADLs.    Time 12    Period Weeks    Status On-going    Target Date 06/03/20      OT LONG TERM GOAL  #12   TITLE Pt. will improve LUE functional reaching with the left engaging the trunk 100% of the time during ADL tasks with minimal cues.    Baseline Left sided numbness has improved. New onset of left sided numbness, and weakness is affecting his ability to perform reaching.    Time 12    Period Weeks    Status On-going    Target Date 05/24/20                 Plan - 05/17/20 1358    Clinical Impression Statement Pt.continuestomakesteadyprogress with BUE strength, and tolerance for there.ex. Pt.continues torequiredfrequent verbal cues, and cues for visual demonstrationfor movement patterns, however required cues for visual demonstration.Pt.continues to require consistent verbal, visual, and tactile cues.Pt.continues to require cues for radial deviation.Pt.continues torequire less cuesto perform UE strengthening exercises, technique, and formas well as visual demonstration prior to the exercises.Pt. continues to work on improving BUE strength, and [facility] skills in order to improve LUE functioning during ADLs,  IADLs, and tranfers.   OT Occupational Profile and History Problem Focused Assessment - Including review of records relating to presenting problem    Occupational performance deficits (Please  refer to evaluation for details): ADL's    Body Structure / Function / Physical Skills UE functional use;Coordination;FMC;Dexterity;Strength;ROM    Cognitive Skills Attention;Memory;Emotional;Problem Solve;Safety Awareness    Rehab Potential Good    Clinical Decision Making Several treatment options, min-mod task modification necessary    Comorbidities Affecting Occupational Performance: Presence of comorbidities impacting occupational performance    Comorbidities impacting occupational performance description: Phyical, cognitive, visual,  medical comorbidities    Modification or Assistance to Complete Evaluation  No modification of tasks or assist necessary to complete eval    OT Frequency 2x / week    OT Duration 12 weeks    OT Treatment/Interventions Self-care/ADL training;DME and/or AE instruction;Therapeutic exercise;Therapeutic activities;Moist Heat;Cognitive remediation/compensation;Neuromuscular education;Visual/perceptual remediation/compensation;Coping strategies training;Patient/family education;Passive range of motion;Psychosocial skills training;Energy conservation;Functional Mobility Training    Family Member Consulted daughter [name]           Patient will benefit from skilled therapeutic intervention in order to improve the following deficits and impairments:   Body Structure / Function / Physical Skills: UE functional use,Coordination,FMC,Dexterity,Strength,ROM Cognitive Skills: Attention,Memory,Emotional,Problem Solve,Safety Awareness     Visit Diagnosis: Muscle weakness (generalized)  Other lack of coordination    Problem List Patient Active Problem List   Diagnosis Date Noted  . Sepsis ([place]) 10/18/2018    Harrel Carina, MS, OTR/L 05/17/2020, 2:02 PM  [place] MAIN [facility] SERVICES [address] [place], [place], [address] Phone: [phone]   Fax:  [phone]  Name: DAKAI BRAITHWAITE MRN: [id number] Date of  Birth: [birth year]

## 2020-05-18 NOTE — Therapy (Signed)
+[facility] MAIN [facility] SERVICES [address] [place], [place], [address] Phone: [phone]   Fax:  [phone]  Physical Therapy Treatment  Patient Details  Name: Kenneth Summers MRN: [id number] Date of Birth: [birth year] No data recorded  Encounter Date: 05/17/2020   PT End of Session - 05/17/20 0859    Visit Number [id number]    Number of Visits 276    Date for PT Re-Evaluation 07/12/20    Authorization Type Medicare reporting period starting 09/11/18; NO FOTO    Authorization Time Period Recert 3/00/7622-07/09/3352    PT Start Time 1300    PT Stop Time 1343    PT Time Calculation (min) 43 min    Equipment Utilized During Treatment Gait belt    Activity Tolerance No increased pain;Patient limited by fatigue;Patient tolerated treatment well    Behavior During Therapy [facility] for tasks assessed/performed           Past Medical History:  Diagnosis Date  . Actinic keratosis   . Alcohol abuse   . APS (antiphospholipid syndrome) ([place])   . Basal cell carcinoma   . BPH (benign prostatic hyperplasia)   . CAD (coronary artery disease)   . Cellulitis   . Cervical spinal stenosis   . Chronic osteomyelitis ([place])   . CKD (chronic kidney disease)   . CKD (chronic kidney disease)   . Clostridium difficile diarrhea   . Depression   . Diplopia   . Fatigue   . GERD (gastroesophageal reflux disease)   . Hyperlipemia   . Hypertension   . Hypovitaminosis D   . IBS (irritable bowel syndrome)   . 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 Insomnia   . Left lumbosacral radiculopathy   . Moderate obstructive sleep apnea   . Osteomyelitis of foot ([place])   . PAD (peripheral artery disease) ([place])   . Pruritus   . RA (rheumatoid arthritis) ([place])   . Radiculopathy   . Restless legs syndrome   . RLS (restless legs syndrome)   . SLE (systemic lupus erythematosus related syndrome) ([place])   . Squamous cell carcinoma of skin 01/27/2019   right mid lower ear helix  . Stroke ([place])    . Toxic maculopathy of both eyes     Past Surgical History:  Procedure Laterality Date  . CARDIAC CATHETERIZATION    . CAROTID ENDARTERECTOMY    . CERVICAL LAMINECTOMY    . CORONARY ANGIOPLASTY    . FRACTURE SURGERY    . fusion C5-6-7    . HERNIA REPAIR    . KNEE ARTHROSCOPY    . TONSILLECTOMY      There were no vitals filed for this visit.   Subjective Assessment - 05/17/20 0857    Subjective Patient reports he successfully was able to attend his grandson's wedding over the weekend and everything went well.    Patient is accompained by: Family member    Pertinent History Patient is a 75 y.o. male who presents to outpatient physical therapy with a referral for medical diagnosis of CVA. This patient's chief complaints consist of left hemiplegia and overall deconditioning leading to the following functional deficits: dependent for ADLs, IADLs, unable to transfer to car, dependent transfers at home with hoyer lift, unable to walk or stand without significant assistance, difficulty with W/C navigation..    Limitations Sitting;Lifting;Standing;House hold activities;Writing;Walking;Other (comment)    Diagnostic tests Brain MRI 11/01/2017: IMPRESSION: 1. Acute right ACA territory infarct as demonstrated on prior CT imaging. No intracranial  hemorrhage or significant mass effect. 2. 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    Patient Stated Goals wants to be able to walk again    Currently in Pain? No/denies    Pain Onset Today    Pain Onset Today           Interventions:   Standing trials with 2 person assist:  1) 47 sec- Assist to keep trunk extended - Max  Cues for knee/trunk extension today.   2) approx 30 sec- Max cues for trunk extension- patient leaning forward but unable to stand well with max A of 2 person.   3) 48 sec - Patient was able to stand briefly with more extended left knee and posture yet unable to maintain without constant cues.    4)  33 sec-  Patient able to dynamically weight shift more to right side with mod physical assist.  5) approx 35 sec- Patient unable to stand without increased UE support.    Clinical Impression: Patient with increased difficulty standing today and required 2 person assist to maintain posture today. Patient with more difficulty standing erect despite constant VC and physical assist.  He will continue to benefit from skilled PT services to improve his functional strength and postural control for improved transfers/standing endurance                                               PT Education - 05/18/20 0858    Education Details Postural cues- Extend knees, tighten glutes, and look up with standing.    Person(s) Educated Patient    Methods Explanation;Demonstration;Tactile cues;Verbal cues    Comprehension Verbalized understanding;Need further instruction;Verbal cues required;Returned demonstration;Tactile cues required            PT Short Term Goals - 04/19/20 2317      PT SHORT TERM GOAL #1   Title Be independent with initial home exercise program for self-management of symptoms.    Baseline HEP to be given at second session; advice to practice unsupported sitting at home (06/19/2018); 07/08/18: Performing at home, 6/29: needs assistance but is doing exercise program regularly; 08/22/18: adherent;    Time 2    Period Weeks    Status Achieved    Target Date 06/26/18      PT SHORT TERM GOAL #2   Title Patient will perform sit to stand transfer with mod Assist in parallel bars for improved transfer ability with decreased caregiver assist.    Baseline 04/19/2020- Max assist of 1 person    Time 6    Period Weeks    Status New    Target Date 05/31/20             PT Long Term Goals - 04/19/20 1321      PT LONG TERM GOAL #1   Title Patient will complete all bed mobility with min A to improve functional independence for getting in and out of  bed and adjusting in bed.     Baseline max A - total A reported by family (06/19/2018); 07/08/18: still requires maxA, dtr reports improvement in rolling since starting therapy; 6/23: min A to supervision in clinic; not doing at home right; 8/5: Pt's daughter states that his rolling has improved, but still has difficulty with all other bed mobility; 10/23/18: pts daughter states that she is  doing about 35% of the work if he is in proper position, he does uses the bed rails to help roll;  using Harrel Lemon for coming to sit EOB; 10/12: pts daughter states that she is doing about 30% of the work if he is in proper position, he does uses the bed rails to help roll, but it is improving;  using Harrel Lemon for coming to sit EOB, 10/29: min A for sit to sidelying, rolling supervision, able to transition sidelying to sit with HHA; 12/21: min-mod A. 03/01/2020- Patient presents improving bed mobility with current ability to roll to left with CGA and verbal and tactile cues and min assist to roll to right as seen by recent visits. 03/31/2020- Patient and caregiver report that he has maintained ability to roll side to side with only minimal assistance.    Time 12    Period Weeks    Status Achieved      PT LONG TERM GOAL #2   Title Patient will complete sit <> stand transfer chair to chair with Min A and LRAD to improve functional independence for household and community mobility.     Baseline min A for STS, still requires elevated surface height to 27".  03/01/20- Patient currently unable to perform a chair to chair transfer and continues to be transferred using hoyer lift. Patient has been training with sit to stand at parallel bars and able to currently perform sit to stand with max assist  from 27in. high surface.03/31/2020- Patient remains at [facility] assist with sit to stand transfer and continued use of hoyer lift transfer for all chair to chair or bed to chair transfers. 04/19/2020- Patient requires Max assist with sit to stand  transfers and requires hoyer for chair to chair transfers.    Time 12    Period Weeks    Status On-going    Target Date 07/12/20      PT LONG TERM GOAL #3   Title Patient will navigate power w/c with min A x 100 feet to improve mobility for household and short community distances.    Baseline required total A (06/12/2018); able to roll 20 feet with min A (06/20/2018); 07/08/18: Pt can go approximately 10' without assist per family;  10/23/2018: pt's daughter states that his power WC mobility has become 100% better with the new hand control, slowed speed, and with practice; 10/12: pt's daughter reports that he can perform household WC mobility and limited community ambulation with minA    Time 12    Period Weeks    Status Achieved      PT LONG TERM GOAL #4   Title Patient will complete car transfers W/C to family SUV with min A using LRAD to improve his abilty to participate in community activities.     Baseline unable to complete car transfers at all (06/12/2018); unable to complete car transfers at all (06/19/2018).; 07/08/18: Unable to perform at this time; 09/11/18: unable to perform at this time; 10/23/2018: unable to complete car transfers at all; 10/12:  unable to complete car transfers at all, 10/29: unable; 01/13/19: unable; 2/11: min-mod A +2, 3/10: min A+1, 12/21: mod A. 03/01/2020- Patient unable to complete car transfers and continues to require medical transportation at this time. 3/14- Patient reports he may be purchasing a conversion van later this week.    Time 12    Period Weeks    Status Deferred      PT LONG TERM GOAL #5   Title Patient will ambulate  at least 10 feet with min A using LRAD to improve mobility for household and community distances.    Baseline unable to take steps (06/12/2018); able to shift R leg forward and back with max A and RW at edge of plinth (06/19/2018); 07/08/18: unable to ambulate at this time. 09/11/18: unable to perform at this time; 10/23/2018: unable to perform at this  time; 10/12: unable to perform at this time, 10/29: unable at this time; 01/13/19: unable at this time, 2/11: min A to step with RLE, unable to step with LLE, 3/10: able to take 2 steps in parallel bars with min A for safety; 4/1: min A for walking 10 feet in parallel bars, 5/24: min A walking 10 feet in parallel bars; 6/21: no change 7/14: no change. 03/01/2020- Patient unable to ambulate and currently focusing on static standing. He was able to take a forward step with his right LE last week but unable today. 03/31/2020- Patient continues to be non-ambulatory but has been able to move right LE forward with standing but unable to take a step with left LE. 04/19/2020- Goal is still not appropriate as of yet- cont to focus on static and dynamic standing balance/pregait activities.    Time 12    Period Weeks    Status Deferred    Target Date 07/12/20      PT LONG TERM GOAL #6   Title Patient will be able to safety navigate up/down ramp with power chair, exhibiting good safety awareness, mod I to safely enter/exit his home.    Baseline 2/11: supervision with intermittent min A;4/1: supervision; 5/24: supervision. 03/01/2020- patient reports that he is able to navigate down the ramp at home to leave the home but continues to need assist to navigate up the ramp into the home.    Time 12    Period Weeks    Status Partially Met      PT LONG TERM GOAL #7   Title Patient will increase functional reach test to >15 inches in sitting to exhibit improved sitting balance and positioning and reduce fall risk;    Baseline 07/30/18: 12 inches; 09/11/18: 10-12 inches; 10/23/18: 12-13 inch; 10/12: 14.5", 10/29: 15.5 inch    Time 4    Period Weeks    Status Achieved      PT LONG TERM GOAL #8   Title Patient will increase LLE quad strength to 3+/5 to improve functional strength for standing and mobility;    Baseline 10/28: 2+/5; 01/13/19: 2+/5, 2/11: 3-/5, 04/16/19: 3-/5, 05/08/19: 3-/5, 5/24: 3-/5, 6/21: 3-/5, 7/14: 3-/5,  12/21: 3-/5. 03/01/2020- Left quad= 3-/5 (focusing on quad strengthening during visits and with home program). 03/31/2020- left quad = 3-/5 with MMT. 04/19/2020= 3- /5 with left quad strength with manual muscle testing.    Time 12    Period Weeks    Status On-going    Target Date 07/12/20      PT LONG TERM GOAL  #9   TITLE Patient will report no vertigo with provoking motions or positions in order to be able to perform ADLs and mobility tasks without symptoms.    Baseline 06/30/19: reports no dizziness in last week; 6/21: one episode this weekend; 7/14: experiencing one a week; 03/01/2020.- Patient has denied any dizziness for over past 3 weeks.    Time 12    Period Weeks    Status Achieved      PT LONG TERM GOAL  #10   TITLE Patient will demonstrate improved functional  LE/Postural strength as seen by ability to perform static standing for 2 min or greater with max assist  to assist with strength required for ADL's and transfers/pregait/gait activities.    Baseline 03/01/2020- Patient able to stand approx 40 sec today with max assist in parallel bars. 03/31/2020- Patient able to demo static stand with max A for 49mn 47 sec today. 04/19/2020- Patient able to demo 2 min today with mod/max assist - yet remains inconsistent.    Time 8    Period Weeks    Status Partially Met    Target Date 07/12/20                 Plan - 05/17/20 0859    Clinical Impression Statement Patient with increased difficulty standing today and required 2 person assist to maintain posture today. Patient with more difficulty standing erect despite constant VC and physical assist.  He will continue to benefit from skilled PT services to improve his functional strength and postural control for improved transfers/standing endurance    Personal Factors and Comorbidities Age;Comorbidity 3+    Comorbidities Relevant past medical history and comorbidities include long term steroid use for lupus, CKD, chronic osteomyelitis,  cervical spine stenosis, BPH, APS, alcohol abuse, peripheral artery disease, depression, GERD, obstructive sleep apnea, HTN, IBS, IgA deficiency, lumbar radiculopathy, RA, Stroke, toxic maculopathy in both eyes, systemic lupus erythematosus related syndrome, cardiac catheterization, carotid endarterectomy, cervical laminectomy, coronary angioplasty, Fusion C5-C7, knee arthroplasty, lumbar surgery.    Examination-Activity Limitations Bed Mobility;Bend;Sit;Toileting;Stand;Stairs;Lift;Transfers;Squat;Locomotion Level;Carry;Dressing;Hygiene/Grooming;Continence    Examination-Participation Restrictions Yard Work;Interpersonal Relationship;Community Activity    Stability/Clinical Decision Making Evolving/Moderate complexity    Rehab Potential Fair    PT Frequency 2x / week    PT Duration 12 weeks    PT Treatment/Interventions ADLs/Self Care Home Management;Electrical Stimulation;Moist Heat;Cryotherapy;Gait training;Stair training;Functional mobility training;Therapeutic exercise;Balance training;Neuromuscular re-education;Cognitive remediation;Patient/family education;Orthotic Fit/Training;Wheelchair mobility training;Manual techniques;Passive range of motion;Energy conservation;Joint Manipulations;Canalith Repostioning;Therapeutic activities;Vestibular    PT Next Visit Plan Continue with progressive postural/LE strengthening/pregait activities    PT Home Exercise Plan no changes    Consulted and Agree with Plan of Care Patient;Family member/caregiver    Family Member Consulted Caregiver- [place]          Patient will benefit from skilled therapeutic intervention in order to improve the following deficits and impairments:  Abnormal gait,Decreased activity tolerance,Decreased cognition,Decreased endurance,Decreased knowledge of use of DME,Decreased range of motion,Decreased skin integrity,Decreased strength,Impaired perceived functional ability,Impaired sensation,Impaired UE functional use,Improper body  mechanics,Pain,Cardiopulmonary status limiting activity,Decreased balance,Decreased coordination,Decreased mobility,Difficulty walking,Impaired tone,Postural dysfunction,Dizziness  Visit Diagnosis: Abnormality of gait and mobility  Difficulty in walking, not elsewhere classified  Muscle weakness (generalized)  Other abnormalities of gait and mobility     Problem List Patient Active Problem List   Diagnosis Date Noted  . Sepsis ([place] 10/18/2018    JLewis Moccasin PT 05/18/2020, 12:55 PM  [facility]MAIN [facility]SERV[facility] [address] [address]Phone: [phone]  Fax:  [phone] Name: JCLAUDIUS MICHMRN: [id number] of Birth: [birth year]

## 2020-05-19 ENCOUNTER — Ambulatory Visit: Payer: Medicare Other

## 2020-05-19 ENCOUNTER — Encounter: Payer: Medicare Other | Admitting: Occupational Therapy

## 2020-05-19 ENCOUNTER — Ambulatory Visit: Payer: Medicare Other | Admitting: Occupational Therapy

## 2020-05-19 ENCOUNTER — Other Ambulatory Visit: Payer: Self-pay

## 2020-05-19 ENCOUNTER — Encounter: Payer: Self-pay | Admitting: Occupational Therapy

## 2020-05-19 DIAGNOSIS — R2689 Other abnormalities of gait and mobility: Secondary | ICD-10-CM

## 2020-05-19 DIAGNOSIS — R269 Unspecified abnormalities of gait and mobility: Secondary | ICD-10-CM

## 2020-05-19 DIAGNOSIS — I69354 Hemiplegia and hemiparesis following cerebral infarction affecting left non-dominant side: Secondary | ICD-10-CM

## 2020-05-19 DIAGNOSIS — R262 Difficulty in walking, not elsewhere classified: Secondary | ICD-10-CM

## 2020-05-19 DIAGNOSIS — R278 Other lack of coordination: Secondary | ICD-10-CM

## 2020-05-19 DIAGNOSIS — M6281 Muscle weakness (generalized): Secondary | ICD-10-CM | POA: Diagnosis not present

## 2020-05-19 NOTE — Therapy (Signed)
[place] MAIN [facility] SERVICES [address] [place], [place], [address] Phone: [phone]   Fax:  [phone]  Occupational Therapy Treatment  Patient Details  Name: Kenneth Summers MRN: [id number] Date of Birth: [birth year] No data recorded  Encounter Date: 05/19/2020   OT End of Session - 05/19/20 1933    Visit Number [id number]    Number of Visits 229    Date for OT Re-Evaluation 05/24/20    Authorization Type Progress report period starting 03/11/2020    OT Start Time 1345    OT Stop Time 1431    OT Time Calculation (min) 46 min    Activity Tolerance Patient tolerated treatment well    Behavior During Therapy [facility] for tasks assessed/performed           Past Medical History:  Diagnosis Date  . Actinic keratosis   . Alcohol abuse   . APS (antiphospholipid syndrome) ([place])   . Basal cell carcinoma   . BPH (benign prostatic hyperplasia)   . CAD (coronary artery disease)   . Cellulitis   . Cervical spinal stenosis   . Chronic osteomyelitis ([place])   . CKD (chronic kidney disease)   . CKD (chronic kidney disease)   . Clostridium difficile diarrhea   . Depression   . Diplopia   . Fatigue   . GERD (gastroesophageal reflux disease)   . Hyperlipemia   . Hypertension   . Hypovitaminosis D   . IBS (irritable bowel syndrome)   . 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 Insomnia   . Left lumbosacral radiculopathy   . Moderate obstructive sleep apnea   . Osteomyelitis of foot ([place])   . PAD (peripheral artery disease) ([place])   . Pruritus   . RA (rheumatoid arthritis) ([place])   . Radiculopathy   . Restless legs syndrome   . RLS (restless legs syndrome)   . SLE (systemic lupus erythematosus related syndrome) ([place])   . Squamous cell carcinoma of skin 01/27/2019   right mid lower ear helix  . Stroke ([place])   . Toxic maculopathy of both eyes     Past Surgical History:  Procedure Laterality Date  . CARDIAC CATHETERIZATION    . CAROTID ENDARTERECTOMY    .  CERVICAL LAMINECTOMY    . CORONARY ANGIOPLASTY    . FRACTURE SURGERY    . fusion C5-6-7    . HERNIA REPAIR    . KNEE ARTHROSCOPY    . TONSILLECTOMY      There were no vitals filed for this visit.   Subjective Assessment - 05/19/20 1932    Subjective  Pt reports he had a fun time at his grandson's wedding, "it was beautiful".  Reports the [place] they rented was nice and spacious, no troubles.    Pertinent History Pt. is a 75 y.o. 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 Pt. PMHx includes: Multiple Falls, Lupus, DJD, Renal Abscess, CVA. Pt. resides with his wife. Pt.'s wife and daughter assist with ADLs. Pt. has caregivers in  for 2 hours a day, 6 days a week. Pt. received Rehab services in acute care, at SNF for STR, and [place] services. Pt. is retired from [employer] for [employer] and [employer].    Patient Stated Goals To regain use of his left UE, and do more for himself.    Currently in Pain? No/denies    Pain Score 0-No pain  Patient seen this date for focus on reaching tasks with use of resistive velcro squares to place and remove from resistive board presented to patient in multiple planes of motion. Cues for weight shifting and reaching. Patient applying resistive pinch pins, yellow to blue onto small dowels presented in a variety of planes of motion to encourage reach.    Neuromuscular Reeducation: Pt turning and flipping cards, forming scatter pile and placing cards in sequential order for each suit, occasional cues for scanning to locate cards.  Mild difficulty picking up from table and occasionally has to slide to the edge of the table to use tip to tip pinch to grasp.    Response to tx: Pt progressing well, fatigues easily with reaching tasks and reports his back feels tired with weight shifting and active trunk use.  Difficulty with blue level of pinch pins, unable to perform black level, most resistive.  Pt continues to be  easily distracted in the clinic and has to be redirected to task.  Continue to work towards goals in plan of care to improve strength, ROM and independence in necessary daily tasks.                      OT Education - 05/19/20 1933    Education Details strengthening, UE functioning    Person(s) Educated Patient    Methods Explanation;Demonstration    Comprehension Returned demonstration;Tactile cues required;Verbalized understanding;Need further instruction               OT Long Term Goals - 04/19/20 1353      OT LONG TERM GOAL #1   Title Pt. will increase left shoulder flexion AROM by 10 degrees to access cabinet/shelf.    Baseline Pt. continues to present with limited left shoulder ROM limitng access to cabinets, and shelves.03/01/2020: Left shoulder 116 degrees with new onset of numbness radiating from the left side of the neck to the hand. Pt. continues to progress with left shoulder ROM .Shouler flexion 130, and continues to work on reaching with increasing emphasis on flexing trunk to improve functional reach.  07/21/19- shoulder flexion to 130 with effort    Time 12    Period Weeks    Status On-going    Target Date 05/24/20      OT LONG TERM GOAL #2   Title Pt. will donn a shirt with Supervision.    Baseline Pt. continues to require modA donning a zip down jacket with consistent cues to initiate the correct direction of the jacket.  Min-modA mahaging the zipper. MinA for donning a T-shirt with assist to roll side to side to pull the shirt down over his back. 07/21/19 continues to require min assist at times for shirt/jacket    Time 12    Period Weeks    Status On-going    Target Date 05/24/20      OT LONG TERM GOAL #4   Title Pt. will improve left grip strength by 10# to be able to open a jar/container.    Baseline Grip strength conitnues to be limited. Decreased left sided grip strength today secondary to a new onset of numbenss, and weakness radiating from  the neck to the left hand. Pt. continues to have a harder time opening a wide mouthed jar, however is able to open a smaller jar easier.    Time 12    Period Weeks    Status On-going    Target Date 05/24/20  OT LONG TERM GOAL #5   Title Pt. will improve left hand [facility] skills to be able to assist with buttoning/zipping.    Baseline Pt. conitnues to work on improving [facility] skills  to improve buttoning , and zipping.03/01/2020: Pt.has had a new onset of weakness, and numbness in the left hand beginning upon waking up this morning which has resulted in a significant change in [facility] skills.  Pt. has improved overall donning a jacket, and  manipulating the zipper on his jacket, however continues to require practice depending on the type of zipper jacket. Pt. continues to work on improving bilateral [facility] skills for buttoning/zipping.  07/21/19 improving with buttons and zippers.    Time 12    Period Weeks    Status On-going    Target Date 05/24/20      OT LONG TERM GOAL #6   Title Pt. will demonstrate visual conmpensatory strategies for 100% of the time during ADLs    Baseline Improving, however Pt. continues to require intermittent cues cues for left sides awareness.    Time 12    Period Weeks    Status On-going    Target Date 05/24/20      OT LONG TERM GOAL #8   Title Pt. will accurately identify potential safety hazard using good safety awareness, and judgement 100% for ADLs, and IADLs.    Baseline Pt. continues to require cues    Time 12    Period Weeks    Status On-going    Target Date 05/24/20      OT LONG TERM GOAL  #9   TITLE Pt. will  navigate his w/c around obstacles with Supervision and 100% accuracy    Baseline Pt. has progressed, however, continues to require cuing for obstacles on the left with fewer  cues overall. Pt. requires significant cues to engage his left UE during tasks. 07/21/2019 continues to improve with decreased assist at times    Time 12    Period Weeks    Status  On-going    Target Date 05/24/20      OT LONG TERM GOAL  #10   TITLE Pt. will independently be able  to reach to place items into cabinetry, and closets.    Baseline Pt. continues to have difficulty with functional reaching continues to be limited.    Time 12    Period Weeks    Status On-going    Target Date 05/24/20      OT LONG TERM GOAL  #11   TITLE Pt. will increase BUE strength by 42mm grades in preparation for ADL transfers.    Baseline Pt. is progressing, however BUE strength continues to be limited. Pt. is able to tolerate increased resistive weights. Pt. continues to work towards improving overall strength for ADLs.    Time 12    Period Weeks    Status On-going    Target Date 06/03/20      OT LONG TERM GOAL  #12   TITLE Pt. will improve LUE functional reaching with the left engaging the trunk 100% of the time during ADL tasks with minimal cues.    Baseline Left sided numbness has improved. New onset of left sided numbness, and weakness is affecting his ability to perform reaching.    Time 12    Period Weeks    Status On-going    Target Date 05/24/20                 Plan - 05/19/20 1934  Clinical Impression Statement Pt progressing well, fatigues easily with reaching tasks and reports his back feels tired with weight shifting and active trunk use.  Difficulty with blue level of pinch pins, unable to perform black level, most resistive.  Pt continues to be easily distracted in the clinic and has to be redirected to task.  Continue to work towards goals in plan of care to improve strength, ROM and independence in necessary daily tasks.    OT Occupational Profile and History Problem Focused Assessment - Including review of records relating to presenting problem    Occupational performance deficits (Please refer to evaluation for details): ADL's    Body Structure / Function / Physical Skills UE functional use;Coordination;FMC;Dexterity;Strength;ROM    Cognitive Skills  Attention;Memory;Emotional;Problem Solve;Safety Awareness    Rehab Potential Good    Clinical Decision Making Several treatment options, min-mod task modification necessary    Comorbidities Affecting Occupational Performance: Presence of comorbidities impacting occupational performance    Comorbidities impacting occupational performance description: Phyical, cognitive, visual,  medical comorbidities    Modification or Assistance to Complete Evaluation  No modification of tasks or assist necessary to complete eval    OT Frequency 2x / week    OT Duration 12 weeks    OT Treatment/Interventions Self-care/ADL training;DME and/or AE instruction;Therapeutic exercise;Therapeutic activities;Moist Heat;Cognitive remediation/compensation;Neuromuscular education;Visual/perceptual remediation/compensation;Coping strategies training;Patient/family education;Passive range of motion;Psychosocial skills training;Energy conservation;Functional Mobility Training    Family Member Consulted daughter [name]           Patient will benefit from skilled therapeutic intervention in order to improve the following deficits and impairments:   Body Structure / Function / Physical Skills: UE functional use,Coordination,FMC,Dexterity,Strength,ROM Cognitive Skills: Attention,Memory,Emotional,Problem Solve,Safety Awareness     Visit Diagnosis: Muscle weakness (generalized)  Other lack of coordination  Hemiplegia and hemiparesis following cerebral infarction affecting left non-dominant side [facility])    Problem List Patient Active Problem List   Diagnosis Date Noted  . Sepsis ([place]) 10/18/2018   Allin Frix T Jackson Coffield, OTR/L, CLT  Jaiya Mooradian 05/19/2020, 7:47 PM  [place] MAIN [facility] SERVICES [address] [place], [place], [address] Phone: [phone]   Fax:  [phone]  Name: YARDEN HILLIS MRN: [id number] Date of Birth: [birth year]

## 2020-05-19 NOTE — Therapy (Signed)
[place] MAIN [facility] SERVICES [address] [place], [place], [address] Phone: [phone]   Fax:  [phone]  Physical Therapy Treatment  Patient Details  Name: Kenneth Summers MRN: [id number] Date of Birth: 19-Sep-1945 No data recorded  Encounter Date: 05/19/2020   PT End of Session - 05/19/20 1318    Visit Number 235    Number of Visits 276    Date for PT Re-Evaluation 07/12/20    Authorization Type Medicare reporting period starting 09/11/18; NO FOTO    Authorization Time Period Recert 1/96/2229-08/15/8919    PT Start Time 1301    PT Stop Time 1344    PT Time Calculation (min) 43 min    Equipment Utilized During Treatment Gait belt    Activity Tolerance No increased pain;Patient limited by fatigue;Patient tolerated treatment well    Behavior During Therapy [facility] for tasks assessed/performed           Past Medical History:  Diagnosis Date  . Actinic keratosis   . Alcohol abuse   . APS (antiphospholipid syndrome) ([place])   . Basal cell carcinoma   . BPH (benign prostatic hyperplasia)   . CAD (coronary artery disease)   . Cellulitis   . Cervical spinal stenosis   . Chronic osteomyelitis ([place])   . CKD (chronic kidney disease)   . CKD (chronic kidney disease)   . Clostridium difficile diarrhea   . Depression   . Diplopia   . Fatigue   . GERD (gastroesophageal reflux disease)   . Hyperlipemia   . Hypertension   . Hypovitaminosis D   . IBS (irritable bowel syndrome)   . 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 Insomnia   . Left lumbosacral radiculopathy   . Moderate obstructive sleep apnea   . Osteomyelitis of foot ([place])   . PAD (peripheral artery disease) ([place])   . Pruritus   . RA (rheumatoid arthritis) ([place])   . Radiculopathy   . Restless legs syndrome   . RLS (restless legs syndrome)   . SLE (systemic lupus erythematosus related syndrome) ([place])   . Squamous cell carcinoma of skin 01/27/2019   right mid lower ear helix  . Stroke ([place])    . Toxic maculopathy of both eyes     Past Surgical History:  Procedure Laterality Date  . CARDIAC CATHETERIZATION    . CAROTID ENDARTERECTOMY    . CERVICAL LAMINECTOMY    . CORONARY ANGIOPLASTY    . FRACTURE SURGERY    . fusion C5-6-7    . HERNIA REPAIR    . KNEE ARTHROSCOPY    . TONSILLECTOMY      There were no vitals filed for this visit.   Subjective Assessment - 05/19/20 1316    Subjective Patient reports doing well today without complaints of pain    Patient is accompained by: Family member    Pertinent History Patient is a 75 y.o. male who presents to outpatient physical therapy with a referral for medical diagnosis of CVA. This patient's chief complaints consist of left hemiplegia and overall deconditioning leading to the following functional deficits: dependent for ADLs, IADLs, unable to transfer to car, dependent transfers at home with hoyer lift, unable to walk or stand without significant assistance, difficulty with W/C navigation..    Limitations Sitting;Lifting;Standing;House hold activities;Writing;Walking;Other (comment)    Diagnostic tests Brain MRI 11/01/2017: IMPRESSION: 1. Acute right ACA territory infarct as demonstrated on prior CT imaging. No intracranial hemorrhage or significant mass effect. 2. Age advanced global  brain atrophy and small chronic right occipital Infarct.    Patient Stated Goals wants to be able to walk again    Currently in Pain? No/denies    Pain Onset Today    Pain Onset Today           Interventions:   Standing trials with 2 person assist (1 on each side today) 1) 1 min 10sec- max  Assist to keep trunk extended and observed increased left  tibial ext rotation - foot abducted. 2) 1 min 7  sec- VC to "look ahead" - adjusted patient's left leg prior to stand by placing a 2x4 board beside his foot to limit his foot abducting- worked well and patient presented with slightly improved quad control.   3) 1 min 3 sec - Patient was able to stand  better and position with improved COG - attempted and performed some dynamic weight shifting with no obvious knee buckling yet patient did fatigue quickly.  4)  58  sec-  Patient able to dynamically weight shift with max assist again- he was fatigued yet demonstrated improved overall posture on last trial.    Clinical Impression: Patient was more consistent with standing trials today with increased overall time exhibited. He appeared to perform better with 1 person on each side vs. 1 in front. He was also able to perform some dynamic weight shifting today which he has been limited or unsafe to attempt in the past. He will continue to benefit from skilled PT services to improve his functional strength and postural control for improved transfers/standing endurance                           PT Education - 05/20/20 0855    Education Details Continued postural cues with transfers/standing.    Person(s) Educated Patient    Methods Explanation;Demonstration;Tactile cues;Verbal cues;Handout    Comprehension Verbalized understanding;Returned demonstration;Verbal cues required;Need further instruction            PT Short Term Goals - 04/19/20 2317      PT SHORT TERM GOAL #1   Title Be independent with initial home exercise program for self-management of symptoms.    Baseline HEP to be given at second session; advice to practice unsupported sitting at home (06/19/2018); 07/08/18: Performing at home, 6/29: needs assistance but is doing exercise program regularly; 08/22/18: adherent;    Time 2    Period Weeks    Status Achieved    Target Date 06/26/18      PT SHORT TERM GOAL #2   Title Patient will perform sit to stand transfer with mod Assist in parallel bars for improved transfer ability with decreased caregiver assist.    Baseline 04/19/2020- Max assist of 1 person    Time 6    Period Weeks    Status New    Target Date 05/31/20             PT Long Term Goals - 04/19/20  1321      PT LONG TERM GOAL #1   Title Patient will complete all bed mobility with min A to improve functional independence for getting in and out of bed and adjusting in bed.     Baseline max A - total A reported by family (06/19/2018); 07/08/18: still requires maxA, dtr reports improvement in rolling since starting therapy; 6/23: min A to supervision in clinic; not doing at home right; 8/5: Pt's daughter states that his rolling has  improved, but still has difficulty with all other bed mobility; 10/23/18: pts daughter states that she is doing about 35% of the work if he is in proper position, he does uses the bed rails to help roll;  using Harrel Lemon for coming to sit EOB; 10/12: pts daughter states that she is doing about 30% of the work if he is in proper position, he does uses the bed rails to help roll, but it is improving;  using Harrel Lemon for coming to sit EOB, 10/29: min A for sit to sidelying, rolling supervision, able to transition sidelying to sit with HHA; 12/21: min-mod A. 03/01/2020- Patient presents improving bed mobility with current ability to roll to left with CGA and verbal and tactile cues and min assist to roll to right as seen by recent visits. 03/31/2020- Patient and caregiver report that he has maintained ability to roll side to side with only minimal assistance.    Time 12    Period Weeks    Status Achieved      PT LONG TERM GOAL #2   Title Patient will complete sit <> stand transfer chair to chair with Min A and LRAD to improve functional independence for household and community mobility.     Baseline min A for STS, still requires elevated surface height to 27".  03/01/20- Patient currently unable to perform a chair to chair transfer and continues to be transferred using hoyer lift. Patient has been training with sit to stand at parallel bars and able to currently perform sit to stand with max assist  from 27in. high surface.03/31/2020- Patient remains at [facility] assist with sit to stand transfer  and continued use of hoyer lift transfer for all chair to chair or bed to chair transfers. 04/19/2020- Patient requires Max assist with sit to stand transfers and requires hoyer for chair to chair transfers.    Time 12    Period Weeks    Status On-going    Target Date 07/12/20      PT LONG TERM GOAL #3   Title Patient will navigate power w/c with min A x 100 feet to improve mobility for household and short community distances.    Baseline required total A (06/12/2018); able to roll 20 feet with min A (06/20/2018); 07/08/18: Pt can go approximately 10' without assist per family;  10/23/2018: pt's daughter states that his power WC mobility has become 100% better with the new hand control, slowed speed, and with practice; 10/12: pt's daughter reports that he can perform household WC mobility and limited community ambulation with minA    Time 12    Period Weeks    Status Achieved      PT LONG TERM GOAL #4   Title Patient will complete car transfers W/C to family SUV with min A using LRAD to improve his abilty to participate in community activities.     Baseline unable to complete car transfers at all (06/12/2018); unable to complete car transfers at all (06/19/2018).; 07/08/18: Unable to perform at this time; 09/11/18: unable to perform at this time; 10/23/2018: unable to complete car transfers at all; 10/12:  unable to complete car transfers at all, 10/29: unable; 01/13/19: unable; 2/11: min-mod A +2, 3/10: min A+1, 12/21: mod A. 03/01/2020- Patient unable to complete car transfers and continues to require medical transportation at this time. 3/14- Patient reports he may be purchasing a conversion van later this week.    Time 12    Period Weeks    Status  Deferred      PT LONG TERM GOAL #5   Title Patient will ambulate at least 10 feet with min A using LRAD to improve mobility for household and community distances.    Baseline unable to take steps (06/12/2018); able to shift R leg forward and back with max A and RW  at edge of plinth (06/19/2018); 07/08/18: unable to ambulate at this time. 09/11/18: unable to perform at this time; 10/23/2018: unable to perform at this time; 10/12: unable to perform at this time, 10/29: unable at this time; 01/13/19: unable at this time, 2/11: min A to step with RLE, unable to step with LLE, 3/10: able to take 2 steps in parallel bars with min A for safety; 4/1: min A for walking 10 feet in parallel bars, 5/24: min A walking 10 feet in parallel bars; 6/21: no change 7/14: no change. 03/01/2020- Patient unable to ambulate and currently focusing on static standing. He was able to take a forward step with his right LE last week but unable today. 03/31/2020- Patient continues to be non-ambulatory but has been able to move right LE forward with standing but unable to take a step with left LE. 04/19/2020- Goal is still not appropriate as of yet- cont to focus on static and dynamic standing balance/pregait activities.    Time 12    Period Weeks    Status Deferred    Target Date 07/12/20      PT LONG TERM GOAL #6   Title Patient will be able to safety navigate up/down ramp with power chair, exhibiting good safety awareness, mod I to safely enter/exit his home.    Baseline 2/11: supervision with intermittent min A;4/1: supervision; 5/24: supervision. 03/01/2020- patient reports that he is able to navigate down the ramp at home to leave the home but continues to need assist to navigate up the ramp into the home.    Time 12    Period Weeks    Status Partially Met      PT LONG TERM GOAL #7   Title Patient will increase functional reach test to >15 inches in sitting to exhibit improved sitting balance and positioning and reduce fall risk;    Baseline 07/30/18: 12 inches; 09/11/18: 10-12 inches; 10/23/18: 12-13 inch; 10/12: 14.5", 10/29: 15.5 inch    Time 4    Period Weeks    Status Achieved      PT LONG TERM GOAL #8   Title Patient will increase LLE quad strength to 3+/5 to improve functional  strength for standing and mobility;    Baseline 10/28: 2+/5; 01/13/19: 2+/5, 2/11: 3-/5, 04/16/19: 3-/5, 05/08/19: 3-/5, 5/24: 3-/5, 6/21: 3-/5, 7/14: 3-/5, 12/21: 3-/5. 03/01/2020- Left quad= 3-/5 (focusing on quad strengthening during visits and with home program). 03/31/2020- left quad = 3-/5 with MMT. 04/19/2020= 3- /5 with left quad strength with manual muscle testing.    Time 12    Period Weeks    Status On-going    Target Date 07/12/20      PT LONG TERM GOAL  #9   TITLE Patient will report no vertigo with provoking motions or positions in order to be able to perform ADLs and mobility tasks without symptoms.    Baseline 06/30/19: reports no dizziness in last week; 6/21: one episode this weekend; 7/14: experiencing one a week; 03/01/2020.- Patient has denied any dizziness for over past 3 weeks.    Time 12    Period Weeks    Status Achieved  PT LONG TERM GOAL  #10   TITLE Patient will demonstrate improved functional LE/Postural strength as seen by ability to perform static standing for 2 min or greater with max assist  to assist with strength required for ADL's and transfers/pregait/gait activities.    Baseline 03/01/2020- Patient able to stand approx 40 sec today with max assist in parallel bars. 03/31/2020- Patient able to demo static stand with max A for 68min 47 sec today. 04/19/2020- Patient able to demo 2 min today with mod/max assist - yet remains inconsistent.    Time 8    Period Weeks    Status Partially Met    Target Date 07/12/20                 Plan - 05/19/20 1318    Clinical Impression Statement Patient was more consistent with standing trials today with increased overall time exhibited. He appeared to perform better with 1 person on each side vs. 1 in front. He was also able to perform some dynamic weight shifting today which he has been limited or unsafe to attempt in the past. He will continue to benefit from skilled PT services to improve his functional strength and  postural control for improved transfers/standing endurance    Personal Factors and Comorbidities Age;Comorbidity 3+    Comorbidities Relevant past medical history and comorbidities include long term steroid use for lupus, CKD, chronic osteomyelitis, cervical spine stenosis, BPH, APS, alcohol abuse, peripheral artery disease, depression, GERD, obstructive sleep apnea, HTN, IBS, IgA deficiency, lumbar radiculopathy, RA, Stroke, toxic maculopathy in both eyes, systemic lupus erythematosus related syndrome, cardiac catheterization, carotid endarterectomy, cervical laminectomy, coronary angioplasty, Fusion C5-C7, knee arthroplasty, lumbar surgery.    Examination-Activity Limitations Bed Mobility;Bend;Sit;Toileting;Stand;Stairs;Lift;Transfers;Squat;Locomotion Level;Carry;Dressing;Hygiene/Grooming;Continence    Examination-Participation Restrictions Yard Work;Interpersonal Relationship;Community Activity    Stability/Clinical Decision Making Evolving/Moderate complexity    Rehab Potential Fair    PT Frequency 2x / week    PT Duration 12 weeks    PT Treatment/Interventions ADLs/Self Care Home Management;Electrical Stimulation;Moist Heat;Cryotherapy;Gait training;Stair training;Functional mobility training;Therapeutic exercise;Balance training;Neuromuscular re-education;Cognitive remediation;Patient/family education;Orthotic Fit/Training;Wheelchair mobility training;Manual techniques;Passive range of motion;Energy conservation;Joint Manipulations;Canalith Repostioning;Therapeutic activities;Vestibular    PT Next Visit Plan Continue with progressive postural/LE strengthening/pregait activities    PT Home Exercise Plan no changes    Consulted and Agree with Plan of Care Patient;Family member/caregiver    Family Member Consulted Caregiver- [place]           Patient will benefit from skilled therapeutic intervention in order to improve the following deficits and impairments:  Abnormal gait,Decreased activity  tolerance,Decreased cognition,Decreased endurance,Decreased knowledge of use of DME,Decreased range of motion,Decreased skin integrity,Decreased strength,Impaired perceived functional ability,Impaired sensation,Impaired UE functional use,Improper body mechanics,Pain,Cardiopulmonary status limiting activity,Decreased balance,Decreased coordination,Decreased mobility,Difficulty walking,Impaired tone,Postural dysfunction,Dizziness  Visit Diagnosis: Abnormality of gait and mobility  Difficulty in walking, not elsewhere classified  Muscle weakness (generalized)  Other abnormalities of gait and mobility  Other lack of coordination     Problem List Patient Active Problem List   Diagnosis Date Noted  . Sepsis ([place]) 10/18/2018    Lewis Moccasin, PT 05/20/2020, 9:05 AM  [place] MAIN [facility] SERVICES [address] [place], [place], [address] Phone: [phone]   Fax:  [phone]  Name: Kenneth Summers MRN: [id number] Date of Birth: [birth year]

## 2020-05-20 ENCOUNTER — Encounter: Payer: Medicare Other | Admitting: Occupational Therapy

## 2020-05-20 ENCOUNTER — Ambulatory Visit: Payer: Medicare Other

## 2020-05-24 ENCOUNTER — Encounter: Payer: Self-pay | Admitting: Occupational Therapy

## 2020-05-24 ENCOUNTER — Ambulatory Visit: Payer: Medicare Other | Admitting: Occupational Therapy

## 2020-05-24 ENCOUNTER — Telehealth: Payer: Self-pay

## 2020-05-24 ENCOUNTER — Ambulatory Visit: Payer: Medicare Other

## 2020-05-24 ENCOUNTER — Other Ambulatory Visit: Payer: Self-pay

## 2020-05-24 DIAGNOSIS — R278 Other lack of coordination: Secondary | ICD-10-CM

## 2020-05-24 DIAGNOSIS — M6281 Muscle weakness (generalized): Secondary | ICD-10-CM

## 2020-05-24 DIAGNOSIS — R262 Difficulty in walking, not elsewhere classified: Secondary | ICD-10-CM

## 2020-05-24 DIAGNOSIS — R2689 Other abnormalities of gait and mobility: Secondary | ICD-10-CM

## 2020-05-24 DIAGNOSIS — R269 Unspecified abnormalities of gait and mobility: Secondary | ICD-10-CM

## 2020-05-24 NOTE — Therapy (Signed)
[place] [facility] SERVICES [address] [place], [place], [address] Phone: [phone]   Fax:  [phone]  Physical Therapy Treatment  Patient Details  Name: RITTER HELSLEY MRN: [id number] Date of Birth: 03/16/1945 No data recorded  Encounter Date: 05/24/2020   PT End of Session - 05/24/20 1302    Visit Number [id number]    Number of Visits 276    Date for PT Re-Evaluation 07/12/20    Authorization Type Medicare reporting period starting 09/11/18; NO FOTO    Authorization Time Period Recert 2/33/0076-03/11/6331    PT Start Time 1302    PT Stop Time 1343    PT Time Calculation (min) 41 min    Equipment Utilized During Treatment Gait belt    Activity Tolerance No increased pain;Patient limited by fatigue;Patient tolerated treatment well    Behavior During Therapy [facility] for tasks assessed/performed           Past Medical History:  Diagnosis Date  . Actinic keratosis   . Alcohol abuse   . APS (antiphospholipid syndrome) ([place])   . Basal cell carcinoma   . BPH (benign prostatic hyperplasia)   . CAD (coronary artery disease)   . Cellulitis   . Cervical spinal stenosis   . Chronic osteomyelitis ([place])   . CKD (chronic kidney disease)   . CKD (chronic kidney disease)   . Clostridium difficile diarrhea   . Depression   . Diplopia   . Fatigue   . GERD (gastroesophageal reflux disease)   . Hyperlipemia   . Hypertension   . Hypovitaminosis D   . IBS (irritable bowel syndrome)   . 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 Insomnia   . Left lumbosacral radiculopathy   . Moderate obstructive sleep apnea   . Osteomyelitis of foot ([place])   . PAD (peripheral artery disease) ([place])   . Pruritus   . RA (rheumatoid arthritis) ([place])   . Radiculopathy   . Restless legs syndrome   . RLS (restless legs syndrome)   . SLE (systemic lupus erythematosus related syndrome) ([place])   . Squamous cell carcinoma of skin 01/27/2019   right mid lower ear helix  . Stroke ([place])    . Toxic maculopathy of both eyes     Past Surgical History:  Procedure Laterality Date  . CARDIAC CATHETERIZATION    . CAROTID ENDARTERECTOMY    . CERVICAL LAMINECTOMY    . CORONARY ANGIOPLASTY    . FRACTURE SURGERY    . fusion C5-6-7    . HERNIA REPAIR    . KNEE ARTHROSCOPY    . TONSILLECTOMY      There were no vitals filed for this visit.   Subjective Assessment - 05/24/20 1302    Subjective Patient reports doing well today without complaints of pain    Patient is accompained by: Family member    Pertinent History Patient is a 75 y.o. male who presents to outpatient physical therapy with a referral for medical diagnosis of CVA. This patient's chief complaints consist of left hemiplegia and overall deconditioning leading to the following functional deficits: dependent for ADLs, IADLs, unable to transfer to car, dependent transfers at home with hoyer lift, unable to walk or stand without significant assistance, difficulty with W/C navigation..    Limitations Sitting;Lifting;Standing;House hold activities;Writing;Walking;Other (comment)    Diagnostic tests Brain MRI 11/01/2017: IMPRESSION: 1. Acute right ACA territory infarct as demonstrated on prior CT imaging. No intracranial hemorrhage or significant mass effect. 2. Age advanced global  brain atrophy and small chronic right occipital Infarct.    Patient Stated Goals wants to be able to walk again    Currently in Pain? No/denies    Pain Onset Today    Pain Onset Today                 Interventions:    Seated punches- jabs, right/left cross- combos x 1 min x 4 trials- Focusing on erect seated posture and coordination of movement. Seated hip march BLE (AAROM on left) x 10 reps Seated knee ext (AAROM on left) x 10 reps each Sliding disc- Knee flex/ext B x 20 reps each- Min assist with left LE    Patient required min assist of 1 person to ensure he did not slouch Standing trials with 2 person assist (1 on each side  today) 1) 38 sec- max  - Patient unable to stand erect today requiring max Assist of 2 people to assist with trunk ext. 2) 47 sec- Slight improvement with trunk ext with max VC/TC 3) 1 min 6 sec -  4)  1 min 17 sec-  Patient reported left leg fatigue and unable to add any weight through his left LE.    Clinical Impression: Patient presented with increased fatigue overall- yet did improve some with practice but requires mod- max assist with overall standing. He presents with fair ability to sit upright while performing dynamic tasks.  He will continue to benefit from skilled PT services to improve his functional strength and postural control for improved transfers/standing endurance                       PT Education - 05/24/20 1356    Education Details specific exercise technique, postural cues    Person(s) Educated Patient    Methods Explanation;Demonstration;Tactile cues;Verbal cues    Comprehension Verbalized understanding;Verbal cues required;Need further instruction;Returned demonstration            PT Short Term Goals - 04/19/20 2317      PT SHORT TERM GOAL #1   Title Be independent with initial home exercise program for self-management of symptoms.    Baseline HEP to be given at second session; advice to practice unsupported sitting at home (06/19/2018); 07/08/18: Performing at home, 6/29: needs assistance but is doing exercise program regularly; 08/22/18: adherent;    Time 2    Period Weeks    Status Achieved    Target Date 06/26/18      PT SHORT TERM GOAL #2   Title Patient will perform sit to stand transfer with mod Assist in parallel bars for improved transfer ability with decreased caregiver assist.    Baseline 04/19/2020- Max assist of 1 person    Time 6    Period Weeks    Status New    Target Date 05/31/20             PT Long Term Goals - 04/19/20 1321      PT LONG TERM GOAL #1   Title Patient will complete all bed mobility with min A to  improve functional independence for getting in and out of bed and adjusting in bed.     Baseline max A - total A reported by family (06/19/2018); 07/08/18: still requires maxA, dtr reports improvement in rolling since starting therapy; 6/23: min A to supervision in clinic; not doing at home right; 8/5: Pt's daughter states that his rolling has improved, but still has difficulty with all other bed  mobility; 10/23/18: pts daughter states that she is doing about 35% of the work if he is in proper position, he does uses the bed rails to help roll;  using Harrel Lemon for coming to sit EOB; 10/12: pts daughter states that she is doing about 30% of the work if he is in proper position, he does uses the bed rails to help roll, but it is improving;  using Harrel Lemon for coming to sit EOB, 10/29: min A for sit to sidelying, rolling supervision, able to transition sidelying to sit with HHA; 12/21: min-mod A. 03/01/2020- Patient presents improving bed mobility with current ability to roll to left with CGA and verbal and tactile cues and min assist to roll to right as seen by recent visits. 03/31/2020- Patient and caregiver report that he has maintained ability to roll side to side with only minimal assistance.    Time 12    Period Weeks    Status Achieved      PT LONG TERM GOAL #2   Title Patient will complete sit <> stand transfer chair to chair with Min A and LRAD to improve functional independence for household and community mobility.     Baseline min A for STS, still requires elevated surface height to 27".  03/01/20- Patient currently unable to perform a chair to chair transfer and continues to be transferred using hoyer lift. Patient has been training with sit to stand at parallel bars and able to currently perform sit to stand with max assist  from 27in. high surface.03/31/2020- Patient remains at [facility] assist with sit to stand transfer and continued use of hoyer lift transfer for all chair to chair or bed to chair transfers.  04/19/2020- Patient requires Max assist with sit to stand transfers and requires hoyer for chair to chair transfers.    Time 12    Period Weeks    Status On-going    Target Date 07/12/20      PT LONG TERM GOAL #3   Title Patient will navigate power w/c with min A x 100 feet to improve mobility for household and short community distances.    Baseline required total A (06/12/2018); able to roll 20 feet with min A (06/20/2018); 07/08/18: Pt can go approximately 10' without assist per family;  10/23/2018: pt's daughter states that his power WC mobility has become 100% better with the new hand control, slowed speed, and with practice; 10/12: pt's daughter reports that he can perform household WC mobility and limited community ambulation with minA    Time 12    Period Weeks    Status Achieved      PT LONG TERM GOAL #4   Title Patient will complete car transfers W/C to family SUV with min A using LRAD to improve his abilty to participate in community activities.     Baseline unable to complete car transfers at all (06/12/2018); unable to complete car transfers at all (06/19/2018).; 07/08/18: Unable to perform at this time; 09/11/18: unable to perform at this time; 10/23/2018: unable to complete car transfers at all; 10/12:  unable to complete car transfers at all, 10/29: unable; 01/13/19: unable; 2/11: min-mod A +2, 3/10: min A+1, 12/21: mod A. 03/01/2020- Patient unable to complete car transfers and continues to require medical transportation at this time. 3/14- Patient reports he may be purchasing a conversion van later this week.    Time 12    Period Weeks    Status Deferred      PT LONG TERM  GOAL #5   Title Patient will ambulate at least 10 feet with min A using LRAD to improve mobility for household and community distances.    Baseline unable to take steps (06/12/2018); able to shift R leg forward and back with max A and RW at edge of plinth (06/19/2018); 07/08/18: unable to ambulate at this time. 09/11/18: unable to  perform at this time; 10/23/2018: unable to perform at this time; 10/12: unable to perform at this time, 10/29: unable at this time; 01/13/19: unable at this time, 2/11: min A to step with RLE, unable to step with LLE, 3/10: able to take 2 steps in parallel bars with min A for safety; 4/1: min A for walking 10 feet in parallel bars, 5/24: min A walking 10 feet in parallel bars; 6/21: no change 7/14: no change. 03/01/2020- Patient unable to ambulate and currently focusing on static standing. He was able to take a forward step with his right LE last week but unable today. 03/31/2020- Patient continues to be non-ambulatory but has been able to move right LE forward with standing but unable to take a step with left LE. 04/19/2020- Goal is still not appropriate as of yet- cont to focus on static and dynamic standing balance/pregait activities.    Time 12    Period Weeks    Status Deferred    Target Date 07/12/20      PT LONG TERM GOAL #6   Title Patient will be able to safety navigate up/down ramp with power chair, exhibiting good safety awareness, mod I to safely enter/exit his home.    Baseline 2/11: supervision with intermittent min A;4/1: supervision; 5/24: supervision. 03/01/2020- patient reports that he is able to navigate down the ramp at home to leave the home but continues to need assist to navigate up the ramp into the home.    Time 12    Period Weeks    Status Partially Met      PT LONG TERM GOAL #7   Title Patient will increase functional reach test to >15 inches in sitting to exhibit improved sitting balance and positioning and reduce fall risk;    Baseline 07/30/18: 12 inches; 09/11/18: 10-12 inches; 10/23/18: 12-13 inch; 10/12: 14.5", 10/29: 15.5 inch    Time 4    Period Weeks    Status Achieved      PT LONG TERM GOAL #8   Title Patient will increase LLE quad strength to 3+/5 to improve functional strength for standing and mobility;    Baseline 10/28: 2+/5; 01/13/19: 2+/5, 2/11: 3-/5, 04/16/19:  3-/5, 05/08/19: 3-/5, 5/24: 3-/5, 6/21: 3-/5, 7/14: 3-/5, 12/21: 3-/5. 03/01/2020- Left quad= 3-/5 (focusing on quad strengthening during visits and with home program). 03/31/2020- left quad = 3-/5 with MMT. 04/19/2020= 3- /5 with left quad strength with manual muscle testing.    Time 12    Period Weeks    Status On-going    Target Date 07/12/20      PT LONG TERM GOAL  #9   TITLE Patient will report no vertigo with provoking motions or positions in order to be able to perform ADLs and mobility tasks without symptoms.    Baseline 06/30/19: reports no dizziness in last week; 6/21: one episode this weekend; 7/14: experiencing one a week; 03/01/2020.- Patient has denied any dizziness for over past 3 weeks.    Time 12    Period Weeks    Status Achieved      PT LONG TERM GOAL  #10  TITLE Patient will demonstrate improved functional LE/Postural strength as seen by ability to perform static standing for 2 min or greater with max assist  to assist with strength required for ADL's and transfers/pregait/gait activities.    Baseline 03/01/2020- Patient able to stand approx 40 sec today with max assist in parallel bars. 03/31/2020- Patient able to demo static stand with max A for 88mn 47 sec today. 04/19/2020- Patient able to demo 2 min today with mod/max assist - yet remains inconsistent.    Time 8    Period Weeks    Status Partially Met    Target Date 07/12/20                 Plan - 05/24/20 1358    Clinical Impression Statement Patient presented with increased fatigue overall- yet did improve some with practice but requires mod- max assist with overall standing. He presents with fair ability to sit upright while performing dynamic tasks.  He will continue to benefit from skilled PT services to improve his functional strength and postural control for improved transfers/standing endurance    Personal Factors and Comorbidities Age;Comorbidity 3+    Comorbidities Relevant past medical history and  comorbidities include long term steroid use for lupus, CKD, chronic osteomyelitis, cervical spine stenosis, BPH, APS, alcohol abuse, peripheral artery disease, depression, GERD, obstructive sleep apnea, HTN, IBS, IgA deficiency, lumbar radiculopathy, RA, Stroke, toxic maculopathy in both eyes, systemic lupus erythematosus related syndrome, cardiac catheterization, carotid endarterectomy, cervical laminectomy, coronary angioplasty, Fusion C5-C7, knee arthroplasty, lumbar surgery.    Examination-Activity Limitations Bed Mobility;Bend;Sit;Toileting;Stand;Stairs;Lift;Transfers;Squat;Locomotion Level;Carry;Dressing;Hygiene/Grooming;Continence    Examination-Participation Restrictions Yard Work;Interpersonal Relationship;Community Activity    Stability/Clinical Decision Making Evolving/Moderate complexity    Rehab Potential Fair    PT Frequency 2x / week    PT Duration 12 weeks    PT Treatment/Interventions ADLs/Self Care Home Management;Electrical Stimulation;Moist Heat;Cryotherapy;Gait training;Stair training;Functional mobility training;Therapeutic exercise;Balance training;Neuromuscular re-education;Cognitive remediation;Patient/family education;Orthotic Fit/Training;Wheelchair mobility training;Manual techniques;Passive range of motion;Energy conservation;Joint Manipulations;Canalith Repostioning;Therapeutic activities;Vestibular    PT Next Visit Plan Continue with progressive postural/LE strengthening/pregait activities    PT Home Exercise Plan no changes    Consulted and Agree with Plan of Care Patient;Family member/caregiver    Family Member Consulted Caregiver- [place]          Patient will benefit from skilled therapeutic intervention in order to improve the following deficits and impairments:  Abnormal gait,Decreased activity tolerance,Decreased cognition,Decreased endurance,Decreased knowledge of use of DME,Decreased range of motion,Decreased skin integrity,Decreased strength,Impaired  perceived functional ability,Impaired sensation,Impaired UE functional use,Improper body mechanics,Pain,Cardiopulmonary status limiting activity,Decreased balance,Decreased coordination,Decreased mobility,Difficulty walking,Impaired tone,Postural dysfunction,Dizziness  Visit Diagnosis: Abnormality of gait and mobility  Difficulty in walking, not elsewhere classified  Muscle weakness (generalized)  Other abnormalities of gait and mobility     Problem List Patient Active Problem List   Diagnosis Date Noted  . [place]is ([place] 10/18/2018    JLewis Moccasin PT 05/24/2020, 2:14 PM  [facility] [address] [address]Phone: [phone]  Fax:  [phone] Name: JKEMOND AMORINMRN: [id number] of Birth: [birth year]

## 2020-05-24 NOTE — Therapy (Deleted)
[place] MAIN [facility] SERVICES [address] [place], [place], [address] Phone: [phone]   Fax:  [phone]  Occupational Therapy Evaluation  Patient Details  Name: Kenneth Summers MRN: [id number] Date of Birth: 1945-06-25 No data recorded  Encounter Date: 05/24/2020   OT End of Session - 05/24/20 1404    Visit Number [id number]    Number of Visits 229    Date for OT Re-Evaluation 08/16/20    Authorization Type Progress report period starting 03/11/2020    OT Start Time 1345    OT Stop Time 1430    OT Time Calculation (min) 45 min    Activity Tolerance Patient tolerated treatment well    Behavior During Therapy [facility] for tasks assessed/performed           Past Medical History:  Diagnosis Date  . Actinic keratosis   . Alcohol abuse   . APS (antiphospholipid syndrome) ([place])   . Basal cell carcinoma   . BPH (benign prostatic hyperplasia)   . CAD (coronary artery disease)   . Cellulitis   . Cervical spinal stenosis   . Chronic osteomyelitis ([place])   . CKD (chronic kidney disease)   . CKD (chronic kidney disease)   . Clostridium difficile diarrhea   . Depression   . Diplopia   . Fatigue   . GERD (gastroesophageal reflux disease)   . Hyperlipemia   . Hypertension   . Hypovitaminosis D   . IBS (irritable bowel syndrome)   . 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 Insomnia   . Left lumbosacral radiculopathy   . Moderate obstructive sleep apnea   . Osteomyelitis of foot ([place])   . PAD (peripheral artery disease) ([place])   . Pruritus   . RA (rheumatoid arthritis) ([place])   . Radiculopathy   . Restless legs syndrome   . RLS (restless legs syndrome)   . SLE (systemic lupus erythematosus related syndrome) ([place])   . Squamous cell carcinoma of skin 01/27/2019   right mid lower ear helix  . Stroke ([place])   . Toxic maculopathy of both eyes     Past Surgical History:  Procedure Laterality Date  . CARDIAC CATHETERIZATION    . CAROTID ENDARTERECTOMY    .  CERVICAL LAMINECTOMY    . CORONARY ANGIOPLASTY    . FRACTURE SURGERY    . fusion C5-6-7    . HERNIA REPAIR    . KNEE ARTHROSCOPY    . TONSILLECTOMY      There were no vitals filed for this visit.   Subjective Assessment - 05/24/20 1403    Subjective  Ptt. reports that he is not sleeping as well.    Patient is accompanied by: Family member    Pertinent History Pt. is a 75 y.o. 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 Pt. PMHx includes: Multiple Falls, Lupus, DJD, Renal Abscess, CVA. Pt. resides with his wife. Pt.'s wife and daughter assist with ADLs. Pt. has caregivers in  for 2 hours a day, 6 days a week. Pt. received Rehab services in acute care, at SNF for STR, and [place] services. Pt. is retired from [employer] for [employer] and [employer].    Currently in Pain? No/denies                              OT Education - 05/24/20 1404    Education Details strengthening, UE  functioning    Person(s) Educated Patient    Methods Explanation;Demonstration    Comprehension Returned demonstration;Tactile cues required;Verbalized understanding;Need further instruction               OT Long Term Goals - 05/24/20 1406      OT LONG TERM GOAL #1   Title Pt. will increase left shoulder flexion AROM by 10 degrees to access cabinet/shelf.    Baseline 05/24/2020: Left shoulder flexion 119 degrees. Pt. is able to reach into cabinets with more ease, however has difficulty getting close enough to them with his w/c.Pt. continues to present with limited left shoulder ROM limitng access to cabinets, and shelves.03/01/2020: Left shoulder 116 degrees with new onset of numbness radiating from the left side of the neck to the hand. Pt. continues to progress with left shoulder ROM .Shouler flexion 130, and continues to work on reaching with increasing emphasis on flexing trunk to improve functional reach.  07/21/19- shoulder flexion to 130 with effort     Time 12    Period Weeks    Status On-going    Target Date 08/16/20      OT LONG TERM GOAL #2   Title Pt. will donn a shirt with Supervision.    Baseline 05/24/2020: [place]. Pt. is assisting more with UE dressing at home. Pt. continues to require modA donning a zip down jacket with consistent cues to initiate the correct direction of the jacket.  Min-modA mahaging the zipper. MinA for donning a T-shirt with assist to roll side to side to pull the shirt down over his back. 07/21/19 continues to require min assist at times for shirt/jacket    Time 12    Period Weeks    Status On-going    Target Date 08/16/20      OT LONG TERM GOAL #4   Title Pt. will improve left grip strength by 10# to be able to open a jar/container.    Baseline Grip strength cotitnues to be limited. Decreased left sided grip strength today secondary to a new onset of numbenss, and weakness radiating from the neck to the left hand. Pt. continues to have a harder time opening a wide mouthed jar, however is able to open a smaller jar easier.    Time 12    Period Weeks    Status On-going    Target Date 08/16/20      OT LONG TERM GOAL #5   Title Pt. will improve left hand [facility] skills to be able to assist with buttoning/zipping.    Baseline 05/24/2020: Pt. is improving with fastening small buttons. Pt. continues to work on improving [facility] skills  to improve buttoning , and zipping.03/01/2020: Pt.has had a new onset of weakness, and numbness in the left hand beginning upon waking up this morning which has resulted in a significant change in [facility] skills.  Pt. has improved overall donning a jacket, and  manipulating the zipper on his jacket, however continues to require practice depending on the type of zipper jacket. Pt. continues to work on improving bilateral [facility] skills for buttoning/zipping.  07/21/19 improving with buttons and zippers.    Time 12    Period Weeks    Status On-going    Target Date 08/16/20      OT LONG TERM GOAL #6    Title Pt. will demonstrate visual conmpensatory strategies for 100% of the time during ADLs    Baseline Improving, however Pt. continues to require intermittent cues cues for left sides  awareness.    Time 12    Period Weeks    Status On-going    Target Date 08/16/20      OT LONG TERM GOAL #7   Period Weeks      OT LONG TERM GOAL #8   Title Pt. will accurately identify potential safety hazard using good safety awareness, and judgement 100% for ADLs, and IADLs.    Baseline Pt. continues to require cues    Time 12    Period Weeks    Status On-going    Target Date 08/16/20      OT LONG TERM GOAL  #9   TITLE Pt. will  navigate his w/c around obstacles with Supervision and 100% accuracy    Baseline Pt. has progressed, however, continues to require cuing for obstacles on the left with fewer  cues overall. Pt. requires significant cues to engage his left UE during tasks. 07/21/2019 continues to improve with decreased assist at times    Time 12    Period Weeks    Status On-going      OT LONG TERM GOAL  #10   TITLE Pt. will independently be able  to reach to place items into cabinetry, and closets.    Baseline Pt. continues to have difficulty with functional reaching continues to be limited.    Time 12    Period Weeks    Status On-going    Target Date 08/16/20      OT LONG TERM GOAL  #11   TITLE Pt. will increase BUE strength by 59mm grades in preparation for ADL transfers.    Baseline 05/24/2020: Pt. is progressing, however BUE strength continues to be limited. Pt. is able to tolerate increased resistive weights. Pt. continues to work towards improving overall strength for ADLs.    Time 12    Period Weeks    Status On-going    Target Date 08/16/20      OT LONG TERM GOAL  #12   TITLE Pt. will improve LUE functional reaching with the left engaging the trunk 100% of the time during ADL tasks with minimal cues.    Baseline Left sided numbness has improved. New onset of left sided numbness,  and weakness is affecting his ability to perform reaching.    Time 12    Period Weeks    Status On-going    Target Date 08/16/20                 Plan - 05/24/20 1405    Clinical Impression Statement p    OT Occupational Profile and History Problem Focused Assessment - Including review of records relating to presenting problem    Occupational performance deficits (Please refer to evaluation for details): ADL's    Body Structure / Function / Physical Skills UE functional use;Coordination;FMC;Dexterity;Strength;ROM    Cognitive Skills Attention;Memory;Emotional;Problem Solve;Safety Awareness    Rehab Potential Good    Clinical Decision Making Several treatment options, min-mod task modification necessary    Comorbidities Affecting Occupational Performance: Presence of comorbidities impacting occupational performance    Comorbidities impacting occupational performance description: Phyical, cognitive, visual,  medical comorbidities    Modification or Assistance to Complete Evaluation  No modification of tasks or assist necessary to complete eval    OT Frequency 2x / week    OT Duration 12 weeks    OT Treatment/Interventions Self-care/ADL training;DME and/or AE instruction;Therapeutic exercise;Therapeutic activities;Moist Heat;Cognitive remediation/compensation;Neuromuscular education;Visual/perceptual remediation/compensation;Coping strategies training;Patient/family education;Passive range of motion;Psychosocial skills training;Energy conservation;Functional Mobility Training  Consulted and Agree with Plan of Care Patient;Family member/caregiver           Patient will benefit from skilled therapeutic intervention in order to improve the following deficits and impairments:   Body Structure / Function / Physical Skills: UE functional use,Coordination,FMC,Dexterity,Strength,ROM Cognitive Skills: Attention,Memory,Emotional,Problem Solve,Safety Awareness     Visit Diagnosis: Muscle  weakness (generalized)  Other lack of coordination    Problem List Patient Active Problem List   Diagnosis Date Noted  . Sepsis ([place]) 10/18/2018    Harrel Carina 05/24/2020, 2:51 PM  [place] MAIN [facility] SERVICES [address] [place], [place], [address] Phone: [phone]   Fax:  [phone]  Name: Kenneth Summers MRN: [id number] Date of Birth: [birth year]

## 2020-05-24 NOTE — Therapy (Signed)
[place] MAIN [facility] SERVICES [address] [place], [place], [address] Phone: [phone]   Fax:  [phone]  Occupational Therapy Treatment/Recertification Note  Patient Details  Name: Kenneth Summers MRN: [id number] Date of Birth: [birth year] No data recorded  Encounter Date: 05/24/2020   OT End of Session - 05/24/20 1404    Visit Number 188    Number of Visits 229    Date for OT Re-Evaluation 08/16/20    Authorization Type Progress report period starting 03/11/2020    OT Start Time 1345    OT Stop Time 1430    OT Time Calculation (min) 45 min    Activity Tolerance Patient tolerated treatment well    Behavior During Therapy [facility] for tasks assessed/performed           Past Medical History:  Diagnosis Date  . Actinic keratosis   . Alcohol abuse   . APS (antiphospholipid syndrome) ([place])   . Basal cell carcinoma   . BPH (benign prostatic hyperplasia)   . CAD (coronary artery disease)   . Cellulitis   . Cervical spinal stenosis   . Chronic osteomyelitis ([place])   . CKD (chronic kidney disease)   . CKD (chronic kidney disease)   . Clostridium difficile diarrhea   . Depression   . Diplopia   . Fatigue   . GERD (gastroesophageal reflux disease)   . Hyperlipemia   . Hypertension   . Hypovitaminosis D   . IBS (irritable bowel syndrome)   . 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 Insomnia   . Left lumbosacral radiculopathy   . Moderate obstructive sleep apnea   . Osteomyelitis of foot ([place])   . PAD (peripheral artery disease) ([place])   . Pruritus   . RA (rheumatoid arthritis) ([place])   . Radiculopathy   . Restless legs syndrome   . RLS (restless legs syndrome)   . SLE (systemic lupus erythematosus related syndrome) ([place])   . Squamous cell carcinoma of skin 01/27/2019   right mid lower ear helix  . Stroke ([place])   . Toxic maculopathy of both eyes     Past Surgical History:  Procedure Laterality Date  . CARDIAC CATHETERIZATION    . CAROTID  ENDARTERECTOMY    . CERVICAL LAMINECTOMY    . CORONARY ANGIOPLASTY    . FRACTURE SURGERY    . fusion C5-6-7    . HERNIA REPAIR    . KNEE ARTHROSCOPY    . TONSILLECTOMY      There were no vitals filed for this visit.   Subjective Assessment - 05/24/20 1403    Subjective  Ptt. reports that he is not sleeping as well.    Patient is accompanied by: Family member    Pertinent History Pt. is a 75 y.o. 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 Pt. PMHx includes: Multiple Falls, Lupus, DJD, Renal Abscess, CVA. Pt. resides with his wife. Pt.'s wife and daughter assist with ADLs. Pt. has caregivers in  for 2 hours a day, 6 days a week. Pt. received Rehab services in acute care, at SNF for STR, and [place] services. Pt. is retired from [employer] for [employer] and [employer].    Currently in Pain? No/denies              [facility] OT Assessment - 05/24/20 1454      Coordination   Right 9 Hole Peg Test 54 sec.    Left 9 Hole  Peg Test 2 min. & 17 sec.      Hand Function   Right Hand Grip (lbs) 50    Right Hand Lateral Pinch 12 lbs    Right Hand 3 Point Pinch 9 lbs    Left Hand Grip (lbs) 29    Left Hand Lateral Pinch 10 lbs    Left 3 point pinch 9 lbs         OT Treatment:  There. Ex.:   Pt. worked on the [employer]	[place] for strengthening for 10 min. No cues for leaning required today, as the pt. Was able to maintain upright midline sitting 100% of the time.  Neuromuscular re-ed:  Pt. worked on bilateral [facility] skills grasping, and manipulating 1/2" washers from a magnetic dish using point grasp pattern. Pt. worked on reaching up, stabilizing, and sustaining shoulder elevation while placing the washer over a small precise target on vertical dowels positioned at various angles. Pt. Utilized bilateral 2#l cuff weights.     Measurements were obtained, and goals were reviewed with the pt. Pt. has made progress over this recertification period. Pt. has improved  with BUE strength, grip strength, pinch strength, and [place] skills. Pt. is improving with self-feeding skills, handling utensils, and fastening small shirt buttons. Pt.'s caregiver reports that the pt. Is able to assist with repositioning, pulling up more with his UEs when donning a shirt. Pt. continues to work on UE functioning in order to increase engagement in, improve, and maximize independence with ADLs, and IADL tasks.                   OT Education - 05/24/20 1404    Education Details strengthening, UE functioning    Person(s) Educated Patient    Methods Explanation;Demonstration    Comprehension Returned demonstration;Tactile cues required;Verbalized understanding;Need further instruction               OT Long Term Goals - 05/24/20 1406      OT LONG TERM GOAL #1   Title Pt. will increase left shoulder flexion AROM by 10 degrees to access cabinet/shelf.    Baseline 05/24/2020: Left shoulder flexion 119 degrees. Pt. is able to reach into cabinets with more ease, however has difficulty getting close enough to them with his w/c.Pt. continues to present with limited left shoulder ROM limitng access to cabinets, and shelves.03/01/2020: Left shoulder 116 degrees with new onset of numbness radiating from the left side of the neck to the hand. Pt. continues to progress with left shoulder ROM .Shouler flexion 130, and continues to work on reaching with increasing emphasis on flexing trunk to improve functional reach.  07/21/19- shoulder flexion to 130 with effort    Time 12    Period Weeks    Status On-going    Target Date 08/16/20      OT LONG TERM GOAL #2   Title Pt. will donn a shirt with Supervision.    Baseline 05/24/2020: [place]. Pt. is assisting more with UE dressing at home. Pt. continues to require modA donning a zip down jacket with consistent cues to initiate the correct direction of the jacket.  Min-modA mahaging the zipper. MinA for donning a T-shirt with assist to roll  side to side to pull the shirt down over his back. 07/21/19 continues to require min assist at times for shirt/jacket    Time 12    Period Weeks    Status On-going    Target Date 08/16/20      OT  LONG TERM GOAL #4   Title Pt. will improve left grip strength by 10# to be able to open a jar/container.    Baseline Grip strength cotitnues to be limited. Decreased left sided grip strength today secondary to a new onset of numbenss, and weakness radiating from the neck to the left hand. Pt. continues to have a harder time opening a wide mouthed jar, however is able to open a smaller jar easier.    Time 12    Period Weeks    Status On-going    Target Date 08/16/20      OT LONG TERM GOAL #5   Title Pt. will improve left hand [facility] skills to be able to assist with buttoning/zipping.    Baseline 05/24/2020: Pt. is improving with fastening small buttons. Pt. continues to work on improving [facility] skills  to improve buttoning , and zipping.03/01/2020: Pt.has had a new onset of weakness, and numbness in the left hand beginning upon waking up this morning which has resulted in a significant change in [facility] skills.  Pt. has improved overall donning a jacket, and  manipulating the zipper on his jacket, however continues to require practice depending on the type of zipper jacket. Pt. continues to work on improving bilateral [facility] skills for buttoning/zipping.  07/21/19 improving with buttons and zippers.    Time 12    Period Weeks    Status On-going    Target Date 08/16/20      OT LONG TERM GOAL #6   Title Pt. will demonstrate visual conmpensatory strategies for 100% of the time during ADLs    Baseline Improving, however Pt. continues to require intermittent cues cues for left sides awareness.    Time 12    Period Weeks    Status On-going    Target Date 08/16/20      OT LONG TERM GOAL #7   Period Weeks      OT LONG TERM GOAL #8   Title Pt. will accurately identify potential safety hazard using good safety  awareness, and judgement 100% for ADLs, and IADLs.    Baseline Pt. continues to require cues    Time 12    Period Weeks    Status On-going    Target Date 08/16/20      OT LONG TERM GOAL  #9   TITLE Pt. will  navigate his w/c around obstacles with Supervision and 100% accuracy    Baseline Pt. has progressed, however, continues to require cuing for obstacles on the left with fewer  cues overall. Pt. requires significant cues to engage his left UE during tasks. 07/21/2019 continues to improve with decreased assist at times    Time 12    Period Weeks    Status On-going      OT LONG TERM GOAL  #10   TITLE Pt. will independently be able  to reach to place items into cabinetry, and closets.    Baseline Pt. continues to have difficulty with functional reaching continues to be limited.    Time 12    Period Weeks    Status On-going    Target Date 08/16/20      OT LONG TERM GOAL  #11   TITLE Pt. will increase BUE strength by 70mm grades in preparation for ADL transfers.    Baseline 05/24/2020: Pt. is progressing, however BUE strength continues to be limited. Pt. is able to tolerate increased resistive weights. Pt. continues to work towards improving overall strength for ADLs.  Time 12    Period Weeks    Status On-going    Target Date 08/16/20      OT LONG TERM GOAL  #12   TITLE Pt. will improve LUE functional reaching with the left engaging the trunk 100% of the time during ADL tasks with minimal cues.    Baseline Left sided numbness has improved. New onset of left sided numbness, and weakness is affecting his ability to perform reaching.    Time 12    Period Weeks    Status On-going    Target Date 08/16/20                 Plan - 05/24/20 1405    Clinical Impression Statement Measurements were obtained, and goals were reviewed with the pt. Pt. has made progress over this recertification period. Pt. has improved with BUE strength, grip strength, pinch strength, and [place] skills. Pt.  is improving with self-feeding skills, handling utensils, and fastening small shirt buttons. Pt.'s caregiver reports that the pt. Is able to assist with repositioning, pulling up more with his UEs when donning a shirt. Pt. continues to work on UE functioning in order to increase engagement in, improve, and maximize independence with ADLs, and IADL tasks.    OT Occupational Profile and History Problem Focused Assessment - Including review of records relating to presenting problem    Occupational performance deficits (Please refer to evaluation for details): ADL's    Body Structure / Function / Physical Skills UE functional use;Coordination;FMC;Dexterity;Strength;ROM    Cognitive Skills Attention;Memory;Emotional;Problem Solve;Safety Awareness    Rehab Potential Good    Clinical Decision Making Several treatment options, min-mod task modification necessary    Comorbidities Affecting Occupational Performance: Presence of comorbidities impacting occupational performance    Comorbidities impacting occupational performance description: Phyical, cognitive, visual,  medical comorbidities    Modification or Assistance to Complete Evaluation  No modification of tasks or assist necessary to complete eval    OT Frequency 2x / week    OT Duration 12 weeks    OT Treatment/Interventions Self-care/ADL training;DME and/or AE instruction;Therapeutic exercise;Therapeutic activities;Moist Heat;Cognitive remediation/compensation;Neuromuscular education;Visual/perceptual remediation/compensation;Coping strategies training;Patient/family education;Passive range of motion;Psychosocial skills training;Energy conservation;Functional Mobility Training    Consulted and Agree with Plan of Care Patient;Family member/caregiver           Patient will benefit from skilled therapeutic intervention in order to improve the following deficits and impairments:   Body Structure / Function / Physical Skills: UE functional  use,Coordination,FMC,Dexterity,Strength,ROM Cognitive Skills: Attention,Memory,Emotional,Problem Solve,Safety Awareness     Visit Diagnosis: Muscle weakness (generalized)  Other lack of coordination    Problem List Patient Active Problem List   Diagnosis Date Noted  . Sepsis ([place]) 10/18/2018    Harrel Carina, MS, OTR/L 05/24/2020, 2:56 PM  [place] MAIN [facility] SERVICES [address] [place], [place], [address] Phone: [phone]   Fax:  [phone]  Name: DOHN STCLAIR MRN: [id number] Date of Birth: 12-04-1945

## 2020-05-24 NOTE — Telephone Encounter (Signed)
Spoke with patient's daughter [name] and scheduled an in-person Palliative Consult for 05/27/20 @ [place] screening was negative. No pets in home. Patient lives with his wife.  Consent obtained; updated Outlook/Netsmart/Team List and Epic.  Family is aware they may be receiving a call from NP the day before or day of to confirm appointment.

## 2020-05-26 ENCOUNTER — Encounter: Payer: Self-pay | Admitting: Occupational Therapy

## 2020-05-26 ENCOUNTER — Other Ambulatory Visit: Payer: Self-pay

## 2020-05-26 ENCOUNTER — Ambulatory Visit: Payer: Medicare Other | Admitting: Occupational Therapy

## 2020-05-26 ENCOUNTER — Ambulatory Visit: Payer: Medicare Other

## 2020-05-26 DIAGNOSIS — R269 Unspecified abnormalities of gait and mobility: Secondary | ICD-10-CM

## 2020-05-26 DIAGNOSIS — M6281 Muscle weakness (generalized): Secondary | ICD-10-CM

## 2020-05-26 DIAGNOSIS — R262 Difficulty in walking, not elsewhere classified: Secondary | ICD-10-CM

## 2020-05-26 DIAGNOSIS — R278 Other lack of coordination: Secondary | ICD-10-CM

## 2020-05-26 NOTE — Therapy (Signed)
[place] MAIN [facility] SERVICES [address] [place], [place], [address] Phone: [phone]   Fax:  [phone]  Occupational Therapy Treatment  Patient Details  Name: Kenneth Summers MRN: [id number] Date of Birth: [birth year] No data recorded  Encounter Date: 05/26/2020   OT End of Session - 05/26/20 1329    Visit Number [id number]    Number of Visits 229    Date for OT Re-Evaluation 08/16/20    Authorization Type Progress report period starting 03/11/2020    OT Start Time 1345    OT Stop Time 1426    OT Time Calculation (min) 41 min    Activity Tolerance Patient tolerated treatment well    Behavior During Therapy [facility] for tasks assessed/performed           Past Medical History:  Diagnosis Date  . Actinic keratosis   . Alcohol abuse   . APS (antiphospholipid syndrome) ([place])   . Basal cell carcinoma   . BPH (benign prostatic hyperplasia)   . CAD (coronary artery disease)   . Cellulitis   . Cervical spinal stenosis   . Chronic osteomyelitis ([place])   . CKD (chronic kidney disease)   . CKD (chronic kidney disease)   . Clostridium difficile diarrhea   . Depression   . Diplopia   . Fatigue   . GERD (gastroesophageal reflux disease)   . Hyperlipemia   . Hypertension   . Hypovitaminosis D   . IBS (irritable bowel syndrome)   . 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 Insomnia   . Left lumbosacral radiculopathy   . Moderate obstructive sleep apnea   . Osteomyelitis of foot ([place])   . PAD (peripheral artery disease) ([place])   . Pruritus   . RA (rheumatoid arthritis) ([place])   . Radiculopathy   . Restless legs syndrome   . RLS (restless legs syndrome)   . SLE (systemic lupus erythematosus related syndrome) ([place])   . Squamous cell carcinoma of skin 01/27/2019   right mid lower ear helix  . Stroke ([place])   . Toxic maculopathy of both eyes     Past Surgical History:  Procedure Laterality Date  . CARDIAC CATHETERIZATION    . CAROTID ENDARTERECTOMY    .  CERVICAL LAMINECTOMY    . CORONARY ANGIOPLASTY    . FRACTURE SURGERY    . fusion C5-6-7    . HERNIA REPAIR    . KNEE ARTHROSCOPY    . TONSILLECTOMY      There were no vitals filed for this visit.   Subjective Assessment - 05/26/20 1328    Subjective  Pt reports he is proud that he stood today with PT.    Patient is accompanied by: Family member    Pertinent History Pt. is a 75 y.o. 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 Pt. PMHx includes: Multiple Falls, Lupus, DJD, Renal Abscess, CVA. Pt. resides with his wife. Pt.'s wife and daughter assist with ADLs. Pt. has caregivers in  for 2 hours a day, 6 days a week. Pt. received Rehab services in acute care, at SNF for STR, and [place] services. Pt. is retired from [employer] for [employer] and [employer].    Patient Stated Goals To regain use of his left UE, and do more for himself.    Currently in Pain? No/denies    Pain Onset Today    Pain Onset Today  Therapeutic Exercise Pt performed yellow theraband seated exercises for UE strengthening secondary to weakness. Pt completed 1 set x 10 reps Bilateral shoulder flexion, pull apart, horizontal abduction, and elbow flexion/extension. Pt required visual demonstration of exercises and cues to attend to task t/o. Pt completed pinch with yellow and red resistive clothespins placing on vertical dowel for functional pinch strengthening.   Therapeutic Activity Pt worked on reaching using the [employer]. Pt grasped and moved the shapes through 4 vertical dowels of varying heights, 2 lowest heights completed using LUE and 2 higher heights completed using RUE. Pt required increased time and repeated rest breaks, increasing breaks as pt fatigued. Pt required MIN A to grasp shapes.                    OT Education - 05/26/20 1328    Education Details strengthening, UE functioning, HEP    Person(s) Educated Patient    Methods  Explanation;Demonstration;Handout    Comprehension Returned demonstration;Tactile cues required;Verbalized understanding;Need further instruction               OT Long Term Goals - 05/24/20 1406      OT LONG TERM GOAL #1   Title Pt. will increase left shoulder flexion AROM by 10 degrees to access cabinet/shelf.    Baseline 05/24/2020: Left shoulder flexion 119 degrees. Pt. is able to reach into cabinets with more ease, however has difficulty getting close enough to them with his w/c.Pt. continues to present with limited left shoulder ROM limitng access to cabinets, and shelves.03/01/2020: Left shoulder 116 degrees with new onset of numbness radiating from the left side of the neck to the hand. Pt. continues to progress with left shoulder ROM .Shouler flexion 130, and continues to work on reaching with increasing emphasis on flexing trunk to improve functional reach.  07/21/19- shoulder flexion to 130 with effort    Time 12    Period Weeks    Status On-going    Target Date 08/16/20      OT LONG TERM GOAL #2   Title Pt. will donn a shirt with Supervision.    Baseline 05/24/2020: [place]. Pt. is assisting more with UE dressing at home. Pt. continues to require modA donning a zip down jacket with consistent cues to initiate the correct direction of the jacket.  Min-modA mahaging the zipper. MinA for donning a T-shirt with assist to roll side to side to pull the shirt down over his back. 07/21/19 continues to require min assist at times for shirt/jacket    Time 12    Period Weeks    Status On-going    Target Date 08/16/20      OT LONG TERM GOAL #4   Title Pt. will improve left grip strength by 10# to be able to open a jar/container.    Baseline Grip strength cotitnues to be limited. Decreased left sided grip strength today secondary to a new onset of numbenss, and weakness radiating from the neck to the left hand. Pt. continues to have a harder time opening a wide mouthed jar, however is able to  open a smaller jar easier.    Time 12    Period Weeks    Status On-going    Target Date 08/16/20      OT LONG TERM GOAL #5   Title Pt. will improve left hand [facility] skills to be able to assist with buttoning/zipping.    Baseline 05/24/2020: Pt. is improving with fastening small buttons. Pt. continues  to work on improving [facility] skills  to improve buttoning , and zipping.03/01/2020: Pt.has had a new onset of weakness, and numbness in the left hand beginning upon waking up this morning which has resulted in a significant change in [facility] skills.  Pt. has improved overall donning a jacket, and  manipulating the zipper on his jacket, however continues to require practice depending on the type of zipper jacket. Pt. continues to work on improving bilateral [facility] skills for buttoning/zipping.  07/21/19 improving with buttons and zippers.    Time 12    Period Weeks    Status On-going    Target Date 08/16/20      OT LONG TERM GOAL #6   Title Pt. will demonstrate visual conmpensatory strategies for 100% of the time during ADLs    Baseline Improving, however Pt. continues to require intermittent cues cues for left sides awareness.    Time 12    Period Weeks    Status On-going    Target Date 08/16/20      OT LONG TERM GOAL #7   Period Weeks      OT LONG TERM GOAL #8   Title Pt. will accurately identify potential safety hazard using good safety awareness, and judgement 100% for ADLs, and IADLs.    Baseline Pt. continues to require cues    Time 12    Period Weeks    Status On-going    Target Date 08/16/20      OT LONG TERM GOAL  #9   TITLE Pt. will  navigate his w/c around obstacles with Supervision and 100% accuracy    Baseline Pt. has progressed, however, continues to require cuing for obstacles on the left with fewer  cues overall. Pt. requires significant cues to engage his left UE during tasks. 07/21/2019 continues to improve with decreased assist at times    Time 12    Period Weeks    Status On-going       OT LONG TERM GOAL  #10   TITLE Pt. will independently be able  to reach to place items into cabinetry, and closets.    Baseline Pt. continues to have difficulty with functional reaching continues to be limited.    Time 12    Period Weeks    Status On-going    Target Date 08/16/20      OT LONG TERM GOAL  #11   TITLE Pt. will increase BUE strength by 25mm grades in preparation for ADL transfers.    Baseline 05/24/2020: Pt. is progressing, however BUE strength continues to be limited. Pt. is able to tolerate increased resistive weights. Pt. continues to work towards improving overall strength for ADLs.    Time 12    Period Weeks    Status On-going    Target Date 08/16/20      OT LONG TERM GOAL  #12   TITLE Pt. will improve LUE functional reaching with the left engaging the trunk 100% of the time during ADL tasks with minimal cues.    Baseline Left sided numbness has improved. New onset of left sided numbness, and weakness is affecting his ability to perform reaching.    Time 12    Period Weeks    Status On-going    Target Date 08/16/20                 Plan - 05/26/20 1329    Clinical Impression Statement Pt tolerated functinoal reach from sitting using shape tower, required MIN A to  grasp shapes. Pt continues to be easily distracted and requires visual demonstration for theraband exercises, pt tolerated yellow theraband. Pt. continues to work on UE functioning in order to increase engagement in, improve, and maximize independence with ADLs, and IADL tasks.    OT Occupational Profile and History Problem Focused Assessment - Including review of records relating to presenting problem    Occupational performance deficits (Please refer to evaluation for details): ADL's    Body Structure / Function / Physical Skills UE functional use;Coordination;FMC;Dexterity;Strength;ROM    Cognitive Skills Attention;Memory;Emotional;Problem Solve;Safety Awareness    Rehab Potential Good     Clinical Decision Making Several treatment options, min-mod task modification necessary    Comorbidities Affecting Occupational Performance: Presence of comorbidities impacting occupational performance    Comorbidities impacting occupational performance description: Phyical, cognitive, visual,  medical comorbidities    Modification or Assistance to Complete Evaluation  No modification of tasks or assist necessary to complete eval    OT Frequency 2x / week    OT Duration 12 weeks    OT Treatment/Interventions Self-care/ADL training;DME and/or AE instruction;Therapeutic exercise;Therapeutic activities;Moist Heat;Cognitive remediation/compensation;Neuromuscular education;Visual/perceptual remediation/compensation;Coping strategies training;Patient/family education;Passive range of motion;Psychosocial skills training;Energy conservation;Functional Mobility Training    Consulted and Agree with Plan of Care Patient;Family member/caregiver           Patient will benefit from skilled therapeutic intervention in order to improve the following deficits and impairments:   Body Structure / Function / Physical Skills: UE functional use,Coordination,FMC,Dexterity,Strength,ROM Cognitive Skills: Attention,Memory,Emotional,Problem Solve,Safety Awareness     Visit Diagnosis: Muscle weakness (generalized)  Other lack of coordination    Problem List Patient Active Problem List   Diagnosis Date Noted  . Sepsis ([place]) 10/18/2018    Dessie Coma, M.S. OTR/L  05/26/20, 3:20 PM  ascom [phone]  [place] MAIN [facility] SERVICES [address] [place], [place], [address] Phone: [phone]   Fax:  [phone]  Name: MERLON ALCORTA MRN: [id number] Date of Birth: 1946-01-07

## 2020-05-26 NOTE — Therapy (Signed)
[place] MAIN [facility] SERVICES [address] [place], [place], [address] Phone: [phone]   Fax:  [phone]  Physical Therapy Treatment  Patient Details  Name: Kenneth Summers MRN: [id number] Date of Birth: [birth year] No data recorded  Encounter Date: 05/26/2020   PT End of Session - 05/26/20 1111    Visit Number 144    Number of Visits 276    Date for PT Re-Evaluation 07/12/20    Authorization Type Medicare reporting period starting 09/11/18; NO FOTO    Authorization Time Period Recert 04/21/4006-07/13/6193    PT Start Time 1300    PT Stop Time 1343    PT Time Calculation (min) 43 min    Equipment Utilized During Treatment Gait belt    Activity Tolerance No increased pain;Patient limited by fatigue;Patient tolerated treatment well    Behavior During Therapy [facility] for tasks assessed/performed           Past Medical History:  Diagnosis Date  . Actinic keratosis   . Alcohol abuse   . APS (antiphospholipid syndrome) ([place])   . Basal cell carcinoma   . BPH (benign prostatic hyperplasia)   . CAD (coronary artery disease)   . Cellulitis   . Cervical spinal stenosis   . Chronic osteomyelitis ([place])   . CKD (chronic kidney disease)   . CKD (chronic kidney disease)   . Clostridium difficile diarrhea   . Depression   . Diplopia   . Fatigue   . GERD (gastroesophageal reflux disease)   . Hyperlipemia   . Hypertension   . Hypovitaminosis D   . IBS (irritable bowel syndrome)   . 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 Insomnia   . Left lumbosacral radiculopathy   . Moderate obstructive sleep apnea   . Osteomyelitis of foot ([place])   . PAD (peripheral artery disease) ([place])   . Pruritus   . RA (rheumatoid arthritis) ([place])   . Radiculopathy   . Restless legs syndrome   . RLS (restless legs syndrome)   . SLE (systemic lupus erythematosus related syndrome) ([place])   . Squamous cell carcinoma of skin 01/27/2019   right mid lower ear helix  . Stroke ([place])    . Toxic maculopathy of both eyes     Past Surgical History:  Procedure Laterality Date  . CARDIAC CATHETERIZATION    . CAROTID ENDARTERECTOMY    . CERVICAL LAMINECTOMY    . CORONARY ANGIOPLASTY    . FRACTURE SURGERY    . fusion C5-6-7    . HERNIA REPAIR    . KNEE ARTHROSCOPY    . TONSILLECTOMY      There were no vitals filed for this visit.   Subjective Assessment - 05/26/20 1305    Subjective Patient reports doing well today without complaints of pain    Patient is accompained by: Family member    Pertinent History Patient is a 75 y.o. male who presents to outpatient physical therapy with a referral for medical diagnosis of CVA. This patient's chief complaints consist of left hemiplegia and overall deconditioning leading to the following functional deficits: dependent for ADLs, IADLs, unable to transfer to car, dependent transfers at home with hoyer lift, unable to walk or stand without significant assistance, difficulty with W/C navigation..    Limitations Sitting;Lifting;Standing;House hold activities;Writing;Walking;Other (comment)    Diagnostic tests Brain MRI 11/01/2017: IMPRESSION: 1. Acute right ACA territory infarct as demonstrated on prior CT imaging. No intracranial hemorrhage or significant mass effect. 2. Age advanced global  brain atrophy and small chronic right occipital Infarct.    Patient Stated Goals wants to be able to walk again    Pain Onset Today    Pain Onset Today             Interventions:   Seated LE strengthening:  AAROM hip march on left LE 2 x 10 reps and AROM on right 2x10 reps.  AROM B knee ext 2 x10 reps AAROM hip abd BLE x 10 reps- minimal activity with left LE   Standing trials with 2 person assist (1 on each side today) 1) 52 sec- max A 2)  101min 07sec-  3) 1 min 15  sec -  4)  1 min 12 sec-   Patient required Mod A x 2 people with standing erect, less cues to extend hips today.    Clinical Impression: Patient presented with improved  ability to stand erec with less overall cues. He was also able to stand better with only mod A of 2 people and much more consistent times today.   He will continue to benefit from skilled PT services to improve his functional strength and postural control for improved transfers/standing endurance                          PT Education - 05/27/20 1110    Education Details specific exercise form    Person(s) Educated Patient    Methods Explanation;Demonstration;Tactile cues;Verbal cues    Comprehension Verbalized understanding;Verbal cues required;Returned demonstration;Tactile cues required;Need further instruction            PT Short Term Goals - 04/19/20 2317      PT SHORT TERM GOAL #1   Title Be independent with initial home exercise program for self-management of symptoms.    Baseline HEP to be given at second session; advice to practice unsupported sitting at home (06/19/2018); 07/08/18: Performing at home, 6/29: needs assistance but is doing exercise program regularly; 08/22/18: adherent;    Time 2    Period Weeks    Status Achieved    Target Date 06/26/18      PT SHORT TERM GOAL #2   Title Patient will perform sit to stand transfer with mod Assist in parallel bars for improved transfer ability with decreased caregiver assist.    Baseline 04/19/2020- Max assist of 1 person    Time 6    Period Weeks    Status New    Target Date 05/31/20             PT Long Term Goals - 04/19/20 1321      PT LONG TERM GOAL #1   Title Patient will complete all bed mobility with min A to improve functional independence for getting in and out of bed and adjusting in bed.     Baseline max A - total A reported by family (06/19/2018); 07/08/18: still requires maxA, dtr reports improvement in rolling since starting therapy; 6/23: min A to supervision in clinic; not doing at home right; 8/5: Pt's daughter states that his rolling has improved, but still has difficulty with all other bed  mobility; 10/23/18: pts daughter states that she is doing about 35% of the work if he is in proper position, he does uses the bed rails to help roll;  using Harrel Lemon for coming to sit EOB; 10/12: pts daughter states that she is doing about 30% of the work if he is in proper position, he does  uses the bed rails to help roll, but it is improving;  using Harrel Lemon for coming to sit EOB, 10/29: min A for sit to sidelying, rolling supervision, able to transition sidelying to sit with HHA; 12/21: min-mod A. 03/01/2020- Patient presents improving bed mobility with current ability to roll to left with CGA and verbal and tactile cues and min assist to roll to right as seen by recent visits. 03/31/2020- Patient and caregiver report that he has maintained ability to roll side to side with only minimal assistance.    Time 12    Period Weeks    Status Achieved      PT LONG TERM GOAL #2   Title Patient will complete sit <> stand transfer chair to chair with Min A and LRAD to improve functional independence for household and community mobility.     Baseline min A for STS, still requires elevated surface height to 27".  03/01/20- Patient currently unable to perform a chair to chair transfer and continues to be transferred using hoyer lift. Patient has been training with sit to stand at parallel bars and able to currently perform sit to stand with max assist  from 27in. high surface.03/31/2020- Patient remains at [facility] assist with sit to stand transfer and continued use of hoyer lift transfer for all chair to chair or bed to chair transfers. 04/19/2020- Patient requires Max assist with sit to stand transfers and requires hoyer for chair to chair transfers.    Time 12    Period Weeks    Status On-going    Target Date 07/12/20      PT LONG TERM GOAL #3   Title Patient will navigate power w/c with min A x 100 feet to improve mobility for household and short community distances.    Baseline required total A (06/12/2018); able to roll 20  feet with min A (06/20/2018); 07/08/18: Pt can go approximately 10' without assist per family;  10/23/2018: pt's daughter states that his power WC mobility has become 100% better with the new hand control, slowed speed, and with practice; 10/12: pt's daughter reports that he can perform household WC mobility and limited community ambulation with minA    Time 12    Period Weeks    Status Achieved      PT LONG TERM GOAL #4   Title Patient will complete car transfers W/C to family SUV with min A using LRAD to improve his abilty to participate in community activities.     Baseline unable to complete car transfers at all (06/12/2018); unable to complete car transfers at all (06/19/2018).; 07/08/18: Unable to perform at this time; 09/11/18: unable to perform at this time; 10/23/2018: unable to complete car transfers at all; 10/12:  unable to complete car transfers at all, 10/29: unable; 01/13/19: unable; 2/11: min-mod A +2, 3/10: min A+1, 12/21: mod A. 03/01/2020- Patient unable to complete car transfers and continues to require medical transportation at this time. 3/14- Patient reports he may be purchasing a conversion van later this week.    Time 12    Period Weeks    Status Deferred      PT LONG TERM GOAL #5   Title Patient will ambulate at least 10 feet with min A using LRAD to improve mobility for household and community distances.    Baseline unable to take steps (06/12/2018); able to shift R leg forward and back with max A and RW at edge of plinth (06/19/2018); 07/08/18: unable to ambulate at this  time. 09/11/18: unable to perform at this time; 10/23/2018: unable to perform at this time; 10/12: unable to perform at this time, 10/29: unable at this time; 01/13/19: unable at this time, 2/11: min A to step with RLE, unable to step with LLE, 3/10: able to take 2 steps in parallel bars with min A for safety; 4/1: min A for walking 10 feet in parallel bars, 5/24: min A walking 10 feet in parallel bars; 6/21: no change 7/14: no  change. 03/01/2020- Patient unable to ambulate and currently focusing on static standing. He was able to take a forward step with his right LE last week but unable today. 03/31/2020- Patient continues to be non-ambulatory but has been able to move right LE forward with standing but unable to take a step with left LE. 04/19/2020- Goal is still not appropriate as of yet- cont to focus on static and dynamic standing balance/pregait activities.    Time 12    Period Weeks    Status Deferred    Target Date 07/12/20      PT LONG TERM GOAL #6   Title Patient will be able to safety navigate up/down ramp with power chair, exhibiting good safety awareness, mod I to safely enter/exit his home.    Baseline 2/11: supervision with intermittent min A;4/1: supervision; 5/24: supervision. 03/01/2020- patient reports that he is able to navigate down the ramp at home to leave the home but continues to need assist to navigate up the ramp into the home.    Time 12    Period Weeks    Status Partially Met      PT LONG TERM GOAL #7   Title Patient will increase functional reach test to >15 inches in sitting to exhibit improved sitting balance and positioning and reduce fall risk;    Baseline 07/30/18: 12 inches; 09/11/18: 10-12 inches; 10/23/18: 12-13 inch; 10/12: 14.5", 10/29: 15.5 inch    Time 4    Period Weeks    Status Achieved      PT LONG TERM GOAL #8   Title Patient will increase LLE quad strength to 3+/5 to improve functional strength for standing and mobility;    Baseline 10/28: 2+/5; 01/13/19: 2+/5, 2/11: 3-/5, 04/16/19: 3-/5, 05/08/19: 3-/5, 5/24: 3-/5, 6/21: 3-/5, 7/14: 3-/5, 12/21: 3-/5. 03/01/2020- Left quad= 3-/5 (focusing on quad strengthening during visits and with home program). 03/31/2020- left quad = 3-/5 with MMT. 04/19/2020= 3- /5 with left quad strength with manual muscle testing.    Time 12    Period Weeks    Status On-going    Target Date 07/12/20      PT LONG TERM GOAL  #9   TITLE Patient will  report no vertigo with provoking motions or positions in order to be able to perform ADLs and mobility tasks without symptoms.    Baseline 06/30/19: reports no dizziness in last week; 6/21: one episode this weekend; 7/14: experiencing one a week; 03/01/2020.- Patient has denied any dizziness for over past 3 weeks.    Time 12    Period Weeks    Status Achieved      PT LONG TERM GOAL  #10   TITLE Patient will demonstrate improved functional LE/Postural strength as seen by ability to perform static standing for 2 min or greater with max assist  to assist with strength required for ADL's and transfers/pregait/gait activities.    Baseline 03/01/2020- Patient able to stand approx 40 sec today with max assist in parallel bars. 03/31/2020- Patient  able to demo static stand with max A for 49min 47 sec today. 04/19/2020- Patient able to demo 2 min today with mod/max assist - yet remains inconsistent.    Time 8    Period Weeks    Status Partially Met    Target Date 07/12/20                 Plan - 05/26/20 1112    Clinical Impression Statement Patient presented with improved ability to stand erec with less overall cues. He was also able to stand better with only mod A of 2 people and much more consistent times today.   He will continue to benefit from skilled PT services to improve his functional strength and postural control for improved transfers/standing endurance    Personal Factors and Comorbidities Age;Comorbidity 3+    Comorbidities Relevant past medical history and comorbidities include long term steroid use for lupus, CKD, chronic osteomyelitis, cervical spine stenosis, BPH, APS, alcohol abuse, peripheral artery disease, depression, GERD, obstructive sleep apnea, HTN, IBS, IgA deficiency, lumbar radiculopathy, RA, Stroke, toxic maculopathy in both eyes, systemic lupus erythematosus related syndrome, cardiac catheterization, carotid endarterectomy, cervical laminectomy, coronary angioplasty, Fusion  C5-C7, knee arthroplasty, lumbar surgery.    Examination-Activity Limitations Bed Mobility;Bend;Sit;Toileting;Stand;Stairs;Lift;Transfers;Squat;Locomotion Level;Carry;Dressing;Hygiene/Grooming;Continence    Examination-Participation Restrictions Yard Work;Interpersonal Relationship;Community Activity    Stability/Clinical Decision Making Evolving/Moderate complexity    Rehab Potential Fair    PT Frequency 2x / week    PT Duration 12 weeks    PT Treatment/Interventions ADLs/Self Care Home Management;Electrical Stimulation;Moist Heat;Cryotherapy;Gait training;Stair training;Functional mobility training;Therapeutic exercise;Balance training;Neuromuscular re-education;Cognitive remediation;Patient/family education;Orthotic Fit/Training;Wheelchair mobility training;Manual techniques;Passive range of motion;Energy conservation;Joint Manipulations;Canalith Repostioning;Therapeutic activities;Vestibular    PT Next Visit Plan Continue with progressive postural/LE strengthening/pregait activities    PT Home Exercise Plan no changes    Consulted and Agree with Plan of Care Patient;Family member/caregiver    Family Member Consulted Caregiver- [place]           Patient will benefit from skilled therapeutic intervention in order to improve the following deficits and impairments:  Abnormal gait,Decreased activity tolerance,Decreased cognition,Decreased endurance,Decreased knowledge of use of DME,Decreased range of motion,Decreased skin integrity,Decreased strength,Impaired perceived functional ability,Impaired sensation,Impaired UE functional use,Improper body mechanics,Pain,Cardiopulmonary status limiting activity,Decreased balance,Decreased coordination,Decreased mobility,Difficulty walking,Impaired tone,Postural dysfunction,Dizziness  Visit Diagnosis: Abnormality of gait and mobility  Difficulty in walking, not elsewhere classified  Muscle weakness (generalized)  Other lack of  coordination     Problem List Patient Active Problem List   Diagnosis Date Noted  . Sepsis ([place]) 10/18/2018    Lewis Moccasin , PT 05/27/2020, [place] MAIN [facility] SERVICES [address] [place], [place], [address] Phone: [phone]   Fax:  [phone]  Name: Kenneth Summers MRN: [id number] Date of Birth: 03/01/1945

## 2020-05-27 ENCOUNTER — Encounter: Payer: Medicare Other | Admitting: Occupational Therapy

## 2020-05-27 ENCOUNTER — Encounter: Payer: Self-pay | Admitting: Primary Care

## 2020-05-27 ENCOUNTER — Other Ambulatory Visit: Payer: Self-pay | Admitting: Primary Care

## 2020-05-27 ENCOUNTER — Ambulatory Visit: Payer: Medicare Other

## 2020-05-27 DIAGNOSIS — M329 Systemic lupus erythematosus, unspecified: Secondary | ICD-10-CM

## 2020-05-27 DIAGNOSIS — I739 Peripheral vascular disease, unspecified: Secondary | ICD-10-CM

## 2020-05-27 DIAGNOSIS — Z515 Encounter for palliative care: Secondary | ICD-10-CM

## 2020-05-27 DIAGNOSIS — Z8673 Personal history of transient ischemic attack (TIA), and cerebral infarction without residual deficits: Secondary | ICD-10-CM

## 2020-05-27 DIAGNOSIS — G2581 Restless legs syndrome: Secondary | ICD-10-CM

## 2020-05-27 DIAGNOSIS — Z7901 Long term (current) use of anticoagulants: Secondary | ICD-10-CM

## 2020-05-27 NOTE — Progress Notes (Signed)
Therapist, nutritional Palliative Care Consult Note Telephone: [phone]  Fax: [phone]    Date of encounter: 05/27/20 PATIENT NAME: Kenneth Summers [address] [place] [address]   [phone] (home)  DOB: 25-Dec-1945 MRN: [id number] PRIMARY CARE PROVIDER:    Dione Housekeeper, MD,  [address] [place] [address] [phone]  REFERRING PROVIDER:   Dione Housekeeper, MD [address] [place],  [place] [address] [phone]  RESPONSIBLE PARTY:    Contact Information    Name Relation Home Work Mobile   Kenneth Summers Daughter [phone]     Kenneth Summers [phone]       I met face to face with patient and family in their home. Palliative Care was asked to follow this patient by consultation request of  Kenneth Summers, * to address advance care planning and complex medical decision making. This is the initial visit.                                     ASSESSMENT AND PLAN / RECOMMENDATIONS:   Advance Care Planning/Goals of Care: Goals include to maximize quality of life and symptom management. Our advance care planning conversation included a discussion about:     The value and importance of advance care planning   Experiences with loved ones who have been seriously ill or have died - hospice patients  Exploration of personal, cultural or spiritual beliefs that might influence medical decisions.  Exploration of goals of care in the event of a sudden injury or illness   Identification  of a healthcare agent - Wife  Creation of an  advance directive document.  Decision not to resuscitate  due to poor prognosis.  Discussed MOST with patient and family, and POA completed form with DNR, limited scope, use of abx, limited use of IV, no feeding tube. Discussed with patient, daughter and wife who were present for discussion and voiced agreement with ACP discussion.  CODE STATUS: DNR  Created MOST and  DNR forms, uploaded to  [facility].  I spent 25 minutes providing this consultation. More than 50% of the time in this consultation was spent in counseling and care coordination __________________________________________________________________________  Symptom Management/Plan:  Main concerns were for skin conditions and continuity of care. Family outlines that they would like/ are  seeking home-based RN care. We discuss the role of palliative as a consultative role to help with Goals of care and symptom management. I outlined the differences between consultative service and in-home care such as home health and hospice. Daughter States that she is seeking a Engineer, civil (consulting) or CNA who can help in the home. We also discussed home visiting pcp groups.  Immobility patient service from lupus and long-standing immobility he currently has a power chair and they have a Hoyer lift to transfer him. He is also going for outpatient PT twice weekly and has been for several months. His goal is to be able to stand and pivot to transfer from bed to wheelchair. Currently he cannot stand more than a few minutes with PT help. Going out has become more onerous as they have to use the [employer]. Dependent in all adls and iadls.  Dry eyes :  family also reports dry eyes and occasional beefy red conjunctiva. No drainage. Family denies this condition today. Sclera were normal colored no bleeding noted. I've recommended OTC sustain complete as well  as allergy drops if he seems to be having itchy eyes through the allergy season, again OTC. I refer them to their pharmacist at [facility] for suggestions.  Skin breakdown in diaper area:  He does occasionally have incontinence and pressure related recurrent skin breakdown but currently family reports this is resolved. Family reports frequent yeast infections and that they are low on antifungal cream until they see their primary provider. I have sent a prescription for clotrimazole-betamethasone  1%/ 0.5% cream to the pharmacy, use daily BID topically,  no refills for use when the skin irruption reoccurs. I refer them to their primary care provider for ongoing management. I also have recommended them to purchase inter-dry product for non-incontinence moisture and skin management.   Loose stools: has history of with excoriation. Recommended pro biotics and / or daily active culture yogurt.   Skin dryness patient reports scalp itching and general skin dryness and itching. He has been using salicylic acid on his scalp per a friend who gave him a home remedy. I suggest they stop this and use a moisturizing shampoo and conditioner. He would also benefit from Eucerin or another high-quality cream to his skin twice daily. This was suggested and family will f/u.  Restless leg: Endorses great discomfort with this symptom. Currently on ropinirole 2 mg. Recommend to increase to 3 or 4 mg with new labs drawn at next PCP visit to assess for renal function. Also using gabapentin, tylenol for leg discomfort.  Follow up Palliative Care Visit: Palliative care will continue to follow for complex medical decision making, advance care planning, and clarification of goals. Return 6-8 weeks or prn.  This visit was coded based on medical decision making (MDM). . PPS: 30%  HOSPICE ELIGIBILITY/DIAGNOSIS: TBD  Chief Complaint: pain  HISTORY OF PRESENT ILLNESS:  Kenneth Summers is a 75 y.o. year old male  with SLE, restless leg, pain syndrome, recurrent yeast infection, CAD, RA. He is largely bedbound but can get into power chair with hoyer lift. His main concerns were pain in legs from neuropathic and claudication etiologies.   History obtained from review of EMR, discussion with primary team, and interview with family, facility staff/caregiver and/or Kenneth Summers.  I reviewed available labs, medications, imaging, studies and related documents from the EMR.  Records reviewed and summarized above.    ROS General: NAD ENMT: denies dysphagia Cardiovascular: denies chest pain, denies DOE Pulmonary: denies cough, denies increased SOB Abdomen: endorses good appetite, denies constipation, occ loose stools, endorses incontinence of bowel GU: denies dysuria, endorses incontinence of urine MSK:  Endorses  Weakness, SLE deficits,  no falls reported Skin: endorses scalp rash and  intermittent yeast infections in groin and buttock regions Neurological: endorses LE neuropatic pain, endorses occ insomnia from pain Psych: Endorses positive mood Heme/lymph/immuno: denies bruises, abnormal bleeding, on coumadin, monitored by daughter with home machine.  Physical Exam: Current and past weights: 240 lbs reported Constitutional: NAD General: frail appearing, in hospital bed in family room EYES: anicteric sclera, lids intact, no discharge  ENMT: intact hearing, oral mucous membranes moist CV: S1S2, RRR, no LE edema Pulmonary: LCTA, no increased work of breathing, no cough, room air Abdomen: intake 75%, normo-active BS + 4 quadrants, soft and non tender, no ascites MSK: mod sarcopenia, moves all extremities with difficulty, R>L,  Non ambulatory Skin: warm and dry, no rashes or wounds on visible skin, reddened areas on scalp Neuro:  +generalized weakness,  + cognitive impairment Psych: non-anxious affect, A and  O x 1-2 Hem/lymph/immuno: no widespread bruising, INR generally in range.   CURRENT PROBLEM LIST:  Patient Active Problem List   Diagnosis Date Noted  . Sepsis ([place]) 10/18/2018  . Long term (current) use of anticoagulants 05/01/2018  . History of stroke 04/11/2018  . Hyperlipidemia 07/04/2017  . Chronic diarrhea 06/12/2017  . 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 Fatigue 02/06/2015  . Visual changes 06/19/2013  . Benign prostatic hyperplasia 01/07/2013  . Depression 01/07/2013  .  Restless legs syndrome 01/07/2013  . Chronic kidney disease, stage 3 unspecified ([place]) 12/20/2011  . CAD (coronary artery disease) 11/03/2010  . Hypertension 11/03/2010  . PAD (peripheral artery disease) ([place]) 11/03/2010  . SLE (systemic lupus erythematosus) ([place]) 11/03/2010   PAST MEDICAL HISTORY:  Active Ambulatory Problems    Diagnosis Date Noted  . Sepsis ([place]) 10/18/2018  . CAD (coronary artery disease) 11/03/2010  . Benign prostatic hyperplasia 01/07/2013  . Chronic kidney disease, stage 3 unspecified ([place]) 12/20/2011  . Chronic diarrhea 06/12/2017  . Depression 01/07/2013  . Fatigue 02/06/2015  . Hyperlipidemia 07/04/2017  . Hypertension 11/03/2010  . History of stroke 04/11/2018  . Long term (current) use of anticoagulants 05/01/2018  . PAD (peripheral artery disease) ([place]) 11/03/2010  . Restless legs syndrome 01/07/2013  . 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 SLE (systemic lupus erythematosus) ([place]) 11/03/2010  . 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 Visual changes 06/19/2013   Resolved Ambulatory Problems    Diagnosis Date Noted  . No Resolved Ambulatory Problems   Past Medical History:  Diagnosis Date  . Actinic keratosis   . Alcohol abuse   . APS (antiphospholipid syndrome) ([place])   . Basal cell carcinoma   . BPH (benign prostatic hyperplasia)   . Cellulitis   . Cervical spinal stenosis   . Chronic osteomyelitis ([place])   . CKD (chronic kidney disease)   . CKD (chronic kidney disease)   . Clostridium difficile diarrhea   . Diplopia   . GERD (gastroesophageal reflux disease)   . Hyperlipemia   . Hypovitaminosis D   . IBS (irritable bowel syndrome)   . 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 Insomnia   . Left lumbosacral radiculopathy   . Moderate obstructive sleep apnea   . Osteomyelitis of foot ([place])   . Pruritus   . RA (rheumatoid arthritis) ([place])   . Radiculopathy   . RLS (restless legs syndrome)   .  SLE (systemic lupus erythematosus related syndrome) ([place])   . Squamous cell carcinoma of skin 01/27/2019  . Stroke [facility])    SOCIAL HX:  Social History   Tobacco Use  . Smoking status: Former Smoker    Packs/day: 1.00    Years: 20.00    Pack years: 20.00    Types: Cigarettes  . Smokeless tobacco: Never Used  Substance Use Topics  . Alcohol use: Yes    Comment: Socially   FAMILY HX:  Family History  Problem Relation Age of Onset  . COPD Mother   . Osteoporosis Mother   . Stroke Father   . Osteoporosis Sister   . Lupus Niece      ALLERGIES:  Allergies  Allergen Reactions  . Hydroxychloroquine Other (See Comments)    Other reaction(s): Other (See Comments)  Causing blindness Visual problems      PERTINENT MEDICATIONS:  Outpatient Encounter Medications as of 05/27/2020  Medication Sig  . aspirin EC 81 MG tablet Take by mouth daily.  Marland Kitchen atorvastatin (LIPITOR) 80 MG tablet Take 80 mg by mouth Nightly.   Marland Kitchen FLUoxetine (PROZAC) 40 MG capsule Take 40 mg by mouth daily.  Marland Kitchen gabapentin (NEURONTIN) 400 MG capsule Take 600 mg by mouth at bedtime.  . hydrocortisone 2.5 % cream Apply to affected areas face 1-2 times daily as directed.  Marland Kitchen ketoconazole (NIZORAL) 2 % cream Apply to affected areas face 1-2 times daily as directed.  . lamoTRIgine (LAMICTAL) 25 MG tablet Take 25 mg by mouth 2 (two) times daily.  Marland Kitchen losartan (COZAAR) 25 MG tablet Take 25 mg by mouth daily.  . montelukast (SINGULAIR) 10 MG tablet Take 10 mg by mouth daily.  . predniSONE (DELTASONE) 5 MG tablet Take 5 mg by mouth daily with breakfast.  . QUEtiapine (SEROQUEL) 25 MG tablet Take 25-50 mg at night  . rOPINIRole (REQUIP) 2 MG tablet Take 2 mg by mouth at bedtime as needed.   . warfarin (COUMADIN) 2.5 MG tablet Take 2.5 mg by mouth daily. Tues, Wednesday Thursday, Saturday, Sunday  . warfarin (COUMADIN) 5 MG tablet Take 5 mg by mouth 2 (two) times a week. Monday and Friday.  . Melatonin 3 MG TABS Take 6 mg by mouth  at bedtime.  (Patient not taking: Reported on 05/27/2020)   No facility-administered encounter medications on file as of 05/27/2020.    Thank you for the opportunity to participate in the care of Kenneth Summers.  The palliative care team will continue to follow. Please call our office at [phone] if we can be of additional assistance.   Jason Coop, NP , DNP, MPH, AGPCNP-BC, ACHPN  COVID-19 PATIENT SCREENING TOOL Asked and negative response unless otherwise noted:   Have you had symptoms of covid, tested positive or been in contact with someone with symptoms/positive test in the past 5-10 days?

## 2020-05-31 ENCOUNTER — Other Ambulatory Visit: Payer: Self-pay

## 2020-05-31 ENCOUNTER — Encounter: Payer: Self-pay | Admitting: Occupational Therapy

## 2020-05-31 ENCOUNTER — Ambulatory Visit: Payer: Medicare Other | Admitting: Occupational Therapy

## 2020-05-31 ENCOUNTER — Ambulatory Visit: Payer: Medicare Other

## 2020-05-31 DIAGNOSIS — R269 Unspecified abnormalities of gait and mobility: Secondary | ICD-10-CM

## 2020-05-31 DIAGNOSIS — R278 Other lack of coordination: Secondary | ICD-10-CM

## 2020-05-31 DIAGNOSIS — R262 Difficulty in walking, not elsewhere classified: Secondary | ICD-10-CM

## 2020-05-31 DIAGNOSIS — M6281 Muscle weakness (generalized): Secondary | ICD-10-CM

## 2020-05-31 NOTE — Therapy (Addendum)
[place] MAIN [facility] SERVICES [address] [place], [place], [address] Phone: [phone]   Fax:  [phone]  Occupational Therapy Progress Note  Dates of reporting period  04/19/2020 to 05/31/2020  Patient Details  Name: Kenneth Summers MRN: [id number] Date of Birth: Sep 04, 1945 No data recorded  Encounter Date: 05/31/2020   OT End of Session - 05/31/20 1352    Visit Number [id number]    Number of Visits 254    Date for OT Re-Evaluation 08/16/20    Authorization Type Progress report period starting 05/31/2020    OT Start Time 1345    OT Stop Time 1430    OT Time Calculation (min) 45 min    Equipment Utilized During Treatment pegboard and patterns    Activity Tolerance Patient tolerated treatment well    Behavior During Therapy WFL for tasks assessed/performed           Past Medical History:  Diagnosis Date  . Actinic keratosis   . Alcohol abuse   . APS (antiphospholipid syndrome) ([place])   . Basal cell carcinoma   . BPH (benign prostatic hyperplasia)   . CAD (coronary artery disease)   . Cellulitis   . Cervical spinal stenosis   . Chronic osteomyelitis ([place])   . CKD (chronic kidney disease)   . CKD (chronic kidney disease)   . Clostridium difficile diarrhea   . Depression   . Diplopia   . Fatigue   . GERD (gastroesophageal reflux disease)   . Hyperlipemia   . Hypertension   . Hypovitaminosis D   . IBS (irritable bowel syndrome)   . 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 Insomnia   . Left lumbosacral radiculopathy   . Moderate obstructive sleep apnea   . Osteomyelitis of foot ([place])   . PAD (peripheral artery disease) ([place])   . Pruritus   . RA (rheumatoid arthritis) ([place])   . Radiculopathy   . Restless legs syndrome   . RLS (restless legs syndrome)   . SLE (systemic lupus erythematosus related syndrome) ([place])   . Squamous cell carcinoma of skin 01/27/2019   right mid lower ear helix  . Stroke ([place])   . Toxic maculopathy of both eyes      Past Surgical History:  Procedure Laterality Date  . CARDIAC CATHETERIZATION    . CAROTID ENDARTERECTOMY    . CERVICAL LAMINECTOMY    . CORONARY ANGIOPLASTY    . FRACTURE SURGERY    . fusion C5-6-7    . HERNIA REPAIR    . KNEE ARTHROSCOPY    . TONSILLECTOMY      There were no vitals filed for this visit.   Subjective Assessment - 05/31/20 1351    Subjective  Pt reports he is proud that he stood today with PT.    Patient is accompanied by: Family member    Pertinent History Pt. is a 75 y.o. 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 Pt. PMHx includes: Multiple Falls, Lupus, DJD, Renal Abscess, CVA. Pt. resides with his wife. Pt.'s wife and daughter assist with ADLs. Pt. has caregivers in  for 2 hours a day, 6 days a week. Pt. received Rehab services in acute care, at SNF for STR, and [place] services. Pt. is retired from [employer] for [employer] and [employer].    Currently in Pain? No/denies           OT TREATMENT   Therapeutic Exercise:  Pt. performed2.5#dowelfor  UE strengthening secondary to weakness.Pt.tolerated increased dowel weight.Bilateral shoulder flexion, chest press,andcircular patterns,were performedfor1-2 sets10-20reps eachwith cues to keep the dowel symmetrical, and level.Pt. worked on4# dumbbell ex. forrightelbow flexion and extension,supination/pronation, wrist extension, and radial deviationwith 4# for the RUE, 2# with the LUE for elbow flexion, extension, and supination/pronation, wrist extension.  Neuromuscular re-ed:  Pt. worked on bilateral [facility] skills grasping, and manipulating 1/2" washers from a magnetic dish using point grasp pattern. Pt. worked on reaching up, stabilizing, and sustaining shoulder elevation while placing the washer over a small precise target on vertical dowels positioned at various angles. Pt. Utilized bilateral 2#l cuff weights.   Pt. has made progress overall, and has  improved with BUE strength, grip strength, pinch strength, and [place] skills. Pt. is improving with self-feeding skills, handling utensils, and fastening small shirt buttons. Pt. required cues to engage his LUE during the [facility] tasks, as well as to engage his trunk when reaching forward. Pt. required minimal left sided lateral support. Pt. continues to work on UE functioning in order to increase engagement in, improve, and maximize independence with ADLs, and IADL tasks.                       OT Education - 05/31/20 1352    Education Details strengthening, UE functioning, HEP    Person(s) Educated Patient    Methods Explanation;Demonstration;Handout    Comprehension Returned demonstration;Tactile cues required;Verbalized understanding;Need further instruction               OT Long Term Goals - 05/31/20 1355      OT LONG TERM GOAL #1   Title Pt. will increase left shoulder flexion AROM by 10 degrees to access cabinet/shelf.    Baseline 05/31/2020: Left shoulder flexion 119 degrees. Pt. is able to reach into cabinets with more ease, however has difficulty getting close enough to them with his w/c.Pt. continues to present with limited left shoulder ROM limitng access to cabinets, and shelves.03/01/2020: Left shoulder 116 degrees with new onset of numbness radiating from the left side of the neck to the hand. Pt. continues to progress with left shoulder ROM .Shouler flexion 130, and continues to work on reaching with increasing emphasis on flexing trunk to improve functional reach.  07/21/19- shoulder flexion to 130 with effort    Time 12    Period Weeks    Status On-going    Target Date 08/16/20      OT LONG TERM GOAL #2   Title Pt. will donn a shirt with Supervision.    Baseline 05/31/2020: [place]. Pt. is assisting more with UE dressing at home. Pt. continues to require modA donning a zip down jacket with consistent cues to initiate the correct direction of the jacket.  Min-modA  mahaging the zipper. MinA for donning a T-shirt with assist to roll side to side to pull the shirt down over his back. 07/21/19 continues to require min assist at times for shirt/jacket    Time 12    Status On-going    Target Date 08/16/20      OT LONG TERM GOAL #4   Title Pt. will improve left grip strength by 10# to be able to open a jar/container.    Baseline Grip strength cotitnues to be limited. Decreased left sided grip strength today secondary to a new onset of numbenss, and weakness radiating from the neck to the left hand. Pt. continues to have a harder time opening a wide mouthed jar, however  is able to open a smaller jar easier.    Time 12    Period Weeks    Status On-going    Target Date 08/16/20      OT LONG TERM GOAL #5   Title Pt. will improve left hand [facility] skills to be able to assist with buttoning/zipping.    Baseline 05/24/2020: Pt. is improving with fastening small buttons. Pt. continues to work on improving [facility] skills  to improve buttoning , and zipping.03/01/2020: Pt.has had a new onset of weakness, and numbness in the left hand beginning upon waking up this morning which has resulted in a significant change in [facility] skills.  Pt. has improved overall donning a jacket, and  manipulating the zipper on his jacket, however continues to require practice depending on the type of zipper jacket. Pt. continues to work on improving bilateral [facility] skills for buttoning/zipping.  07/21/19 improving with buttons and zippers.    Time 12    Period Weeks    Status On-going    Target Date 08/16/20      OT LONG TERM GOAL #6   Title Pt. will demonstrate visual conmpensatory strategies for 100% of the time during ADLs    Baseline Improving, however Pt. continues to require intermittent cues cues for left sides awareness.    Time 12    Period Weeks    Status On-going    Target Date 08/16/20      OT LONG TERM GOAL #8   Title Pt. will accurately identify potential safety hazard using good safety  awareness, and judgement 100% for ADLs, and IADLs.    Baseline Pt. continues to require cues    Time 12    Period Weeks    Status On-going    Target Date 08/16/20      OT LONG TERM GOAL  #9   TITLE Pt. will  navigate his w/c around obstacles with Supervision and 100% accuracy    Baseline Pt. has progressed, however, continues to require cuing for obstacles on the left with fewer  cues overall. Pt. requires significant cues to engage his left UE during tasks. 07/21/2019 continues to improve with decreased assist at times    Time 12    Period Weeks    Status On-going    Target Date 08/16/20      OT LONG TERM GOAL  #10   TITLE Pt. will independently be able  to reach to place items into cabinetry, and closets.    Baseline Pt. continues to have difficulty with functional reaching continues to be limited.    Time 12    Period Weeks    Status On-going    Target Date 08/16/20      OT LONG TERM GOAL  #11   TITLE Pt. will increase BUE strength by 42mm grades in preparation for ADL transfers.    Baseline 05/31/2020: Pt. is progressing, however BUE strength continues to be limited. Pt. is able to tolerate increased resistive weights. Pt. continues to work towards improving overall strength for ADLs.    Time 12    Period Weeks    Status On-going    Target Date 08/16/20      OT LONG TERM GOAL  #12   TITLE Pt. will improve LUE functional reaching with the left engaging the trunk 100% of the time during ADL tasks with minimal cues.    Baseline Left sided numbness has improved. New onset of left sided numbness, and weakness is affecting his ability to  perform reaching.    Time 12    Period Weeks    Status On-going    Target Date 08/16/20                 Plan - 05/31/20 1353    Clinical Impression Statement Pt. Has made progress overall, and has improved with BUE strength, grip strength, pinch strength, and [place] skills. Pt. is improving with self-feeding skills, handling utensils, and  fastening small shirt buttons. Pt. required cues to engage his LUE during the [facility] tasks, as well as to engage his trunk when reaching forward. Pt. required minimal left sided lateral support. Pt. continues to work on UE functioning in order to increase engagement in, improve, and maximize independence with ADLs, and IADL tasks.    OT Occupational Profile and History Problem Focused Assessment - Including review of records relating to presenting problem    Occupational performance deficits (Please refer to evaluation for details): ADL's    Body Structure / Function / Physical Skills UE functional use;Coordination;FMC;Dexterity;Strength;ROM    Cognitive Skills Attention;Memory;Emotional;Problem Solve;Safety Awareness    Rehab Potential Good    Clinical Decision Making Several treatment options, min-mod task modification necessary    Comorbidities Affecting Occupational Performance: Presence of comorbidities impacting occupational performance    Comorbidities impacting occupational performance description: Phyical, cognitive, visual,  medical comorbidities    Modification or Assistance to Complete Evaluation  No modification of tasks or assist necessary to complete eval    OT Frequency 2x / week    OT Duration 12 weeks    OT Treatment/Interventions Self-care/ADL training;DME and/or AE instruction;Therapeutic exercise;Therapeutic activities;Moist Heat;Cognitive remediation/compensation;Neuromuscular education;Visual/perceptual remediation/compensation;Coping strategies training;Patient/family education;Passive range of motion;Psychosocial skills training;Energy conservation;Functional Mobility Training    Recommended Other Services PT    Consulted and Agree with Plan of Care Patient;Family member/caregiver    Family Member Consulted daughter [name]           Patient will benefit from skilled therapeutic intervention in order to improve the following deficits and impairments:   Body Structure /  Function / Physical Skills: UE functional use,Coordination,FMC,Dexterity,Strength,ROM Cognitive Skills: Attention,Memory,Emotional,Problem Solve,Safety Awareness     Visit Diagnosis: Muscle weakness (generalized)  Other lack of coordination    Problem List Patient Active Problem List   Diagnosis Date Noted  . Sepsis ([place]) 10/18/2018  . Long term (current) use of anticoagulants 05/01/2018  . History of stroke 04/11/2018  . Hyperlipidemia 07/04/2017  . Chronic diarrhea 06/12/2017  . 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 Fatigue 02/06/2015  . Visual changes 06/19/2013  . Benign prostatic hyperplasia 01/07/2013  . Depression 01/07/2013  . Restless legs syndrome 01/07/2013  . Chronic kidney disease, stage 3 unspecified ([place]) 12/20/2011  . CAD (coronary artery disease) 11/03/2010  . Hypertension 11/03/2010  . PAD (peripheral artery disease) ([place]) 11/03/2010  . SLE (systemic lupus erythematosus) ([place]) 11/03/2010    Harrel Carina, MS, OTR/L 05/31/2020, 2:12 PM  [place] MAIN [facility] SERVICES [address] [place], [place], [address] Phone: [phone]   Fax:  [phone]  Name: Kenneth Summers MRN: [id number] Date of Birth: Mar 04, 1945

## 2020-06-01 NOTE — Therapy (Signed)
[place] MAIN [facility] SERVICES [address] [place], [place], [address] Phone: [phone]   Fax:  [phone]  Physical Therapy Treatment  Patient Details  Name: Kenneth Summers MRN: [id number] Date of Birth: 24-Oct-1945 No data recorded  Encounter Date: 05/31/2020   PT End of Session - 05/31/20 1512    Visit Number [id number]    Number of Visits 276    Date for PT Re-Evaluation 07/12/20    Authorization Type Medicare reporting period starting 09/11/18; NO FOTO    Authorization Time Period Recert 03/10/3341-06/12/8614    PT Start Time 1300    PT Stop Time 1343    PT Time Calculation (min) 43 min    Equipment Utilized During Treatment Gait belt    Activity Tolerance No increased pain;Patient limited by fatigue;Patient tolerated treatment well    Behavior During Therapy [facility] for tasks assessed/performed           Past Medical History:  Diagnosis Date  . Actinic keratosis   . Alcohol abuse   . APS (antiphospholipid syndrome) ([place])   . Basal cell carcinoma   . BPH (benign prostatic hyperplasia)   . CAD (coronary artery disease)   . Cellulitis   . Cervical spinal stenosis   . Chronic osteomyelitis ([place])   . CKD (chronic kidney disease)   . CKD (chronic kidney disease)   . Clostridium difficile diarrhea   . Depression   . Diplopia   . Fatigue   . GERD (gastroesophageal reflux disease)   . Hyperlipemia   . Hypertension   . Hypovitaminosis D   . IBS (irritable bowel syndrome)   . 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 Insomnia   . Left lumbosacral radiculopathy   . Moderate obstructive sleep apnea   . Osteomyelitis of foot ([place])   . PAD (peripheral artery disease) ([place])   . Pruritus   . RA (rheumatoid arthritis) ([place])   . Radiculopathy   . Restless legs syndrome   . RLS (restless legs syndrome)   . SLE (systemic lupus erythematosus related syndrome) ([place])   . Squamous cell carcinoma of skin 01/27/2019   right mid lower ear helix  . Stroke ([place])    . Toxic maculopathy of both eyes     Past Surgical History:  Procedure Laterality Date  . CARDIAC CATHETERIZATION    . CAROTID ENDARTERECTOMY    . CERVICAL LAMINECTOMY    . CORONARY ANGIOPLASTY    . FRACTURE SURGERY    . fusion C5-6-7    . HERNIA REPAIR    . KNEE ARTHROSCOPY    . TONSILLECTOMY      There were no vitals filed for this visit.   Subjective Assessment - 05/31/20 1324    Subjective Patient reports he is well today and no new complaints.    Patient is accompained by: Family member    Pertinent History Patient is a 75 y.o. male who presents to outpatient physical therapy with a referral for medical diagnosis of CVA. This patient's chief complaints consist of left hemiplegia and overall deconditioning leading to the following functional deficits: dependent for ADLs, IADLs, unable to transfer to car, dependent transfers at home with hoyer lift, unable to walk or stand without significant assistance, difficulty with W/C navigation..    Limitations Sitting;Lifting;Standing;House hold activities;Writing;Walking;Other (comment)    Diagnostic tests Brain MRI 11/01/2017: IMPRESSION: 1. Acute right ACA territory infarct as demonstrated on prior CT imaging. No intracranial hemorrhage or significant mass effect. 2. 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    Patient Stated Goals wants to be able to walk again    Currently in Pain? No/denies    Pain Onset Today    Pain Onset Today           Interventions:   Standing trials with 1 person assist (In front of patient today) 1) 47 sec- max A- Patient with max difficulty extending hips despite VC/TC 2)  1 min 42 sec-  Max A to stand - less overall cues to look ahead 3) 1 min 35  sec - Same as 2 yet able to dynamically weight shift left to right several times with no left knee buckling.  4)  1 min 47 sec- able to dynamic wt shift for approx 20 sec followed by static standing.  5) 44 sec  -Requiring  more assist to maintain erect position most likely due to fatigue.  Education provided throughout session via VC/TC and demonstration to facilitate movement at target joints and correct muscle activation for all testing and exercises performed.   Clinical Impression: Patient improved to more dynamic standing - able to weight shift today and continues to exhibit more consistent times with prolonged standing today. He continues to exhibit some distractibility but able to follow cues with increased overall prompting.   He will continue to benefit from skilled PT services to improve his functional strength and postural control for improved transfers/standing endurance                                                 PT Education - 05/31/20 1324    Education Details specific exercise technique, postural cues    Person(s) Educated Patient    Methods Explanation;Demonstration;Tactile cues    Comprehension Verbalized understanding;Need further instruction;Verbal cues required;Returned demonstration;Tactile cues required            PT Short Term Goals - 04/19/20 2317      PT SHORT TERM GOAL #1   Title Be independent with initial home exercise program for self-management of symptoms.    Baseline HEP to be given at second session; advice to practice unsupported sitting at home (06/19/2018); 07/08/18: Performing at home, 6/29: needs assistance but is doing exercise program regularly; 08/22/18: adherent;    Time 2    Period Weeks    Status Achieved    Target Date 06/26/18      PT SHORT TERM GOAL #2   Title Patient will perform sit to stand transfer with mod Assist in parallel bars for improved transfer ability with decreased caregiver assist.    Baseline 04/19/2020- Max assist of 1 person    Time 6    Period Weeks    Status New    Target Date 05/31/20             PT Long Term Goals - 04/19/20 1321      PT LONG TERM GOAL #1   Title Patient will  complete all bed mobility with min A to improve functional independence for getting in and out of bed and adjusting in bed.     Baseline max A - total A reported by family (06/19/2018); 07/08/18: still requires maxA, dtr reports improvement in rolling since starting therapy; 6/23: min A to supervision in clinic; not doing at home right; 8/5: Pt's daughter states that  his rolling has improved, but still has difficulty with all other bed mobility; 10/23/18: pts daughter states that she is doing about 35% of the work if he is in proper position, he does uses the bed rails to help roll;  using Michiel Sites for coming to sit EOB; 10/12: pts daughter states that she is doing about 30% of the work if he is in proper position, he does uses the bed rails to help roll, but it is improving;  using Michiel Sites for coming to sit EOB, 10/29: min A for sit to sidelying, rolling supervision, able to transition sidelying to sit with HHA; 12/21: min-mod A. 03/01/2020- Patient presents improving bed mobility with current ability to roll to left with CGA and verbal and tactile cues and min assist to roll to right as seen by recent visits. 03/31/2020- Patient and caregiver report that he has maintained ability to roll side to side with only minimal assistance.    Time 12    Period Weeks    Status Achieved      PT LONG TERM GOAL #2   Title Patient will complete sit <> stand transfer chair to chair with Min A and LRAD to improve functional independence for household and community mobility.     Baseline min A for STS, still requires elevated surface height to 27".  03/01/20- Patient currently unable to perform a chair to chair transfer and continues to be transferred using hoyer lift. Patient has been training with sit to stand at parallel bars and able to currently perform sit to stand with max assist  from 27in. high surface.03/31/2020- Patient remains at [facility] assist with sit to stand transfer and continued use of hoyer lift transfer for all chair  to chair or bed to chair transfers. 04/19/2020- Patient requires Max assist with sit to stand transfers and requires hoyer for chair to chair transfers.    Time 12    Period Weeks    Status On-going    Target Date 07/12/20      PT LONG TERM GOAL #3   Title Patient will navigate power w/c with min A x 100 feet to improve mobility for household and short community distances.    Baseline required total A (06/12/2018); able to roll 20 feet with min A (06/20/2018); 07/08/18: Pt can go approximately 10' without assist per family;  10/23/2018: pt's daughter states that his power WC mobility has become 100% better with the new hand control, slowed speed, and with practice; 10/12: pt's daughter reports that he can perform household WC mobility and limited community ambulation with minA    Time 12    Period Weeks    Status Achieved      PT LONG TERM GOAL #4   Title Patient will complete car transfers W/C to family SUV with min A using LRAD to improve his abilty to participate in community activities.     Baseline unable to complete car transfers at all (06/12/2018); unable to complete car transfers at all (06/19/2018).; 07/08/18: Unable to perform at this time; 09/11/18: unable to perform at this time; 10/23/2018: unable to complete car transfers at all; 10/12:  unable to complete car transfers at all, 10/29: unable; 01/13/19: unable; 2/11: min-mod A +2, 3/10: min A+1, 12/21: mod A. 03/01/2020- Patient unable to complete car transfers and continues to require medical transportation at this time. 3/14- Patient reports he may be purchasing a conversion van later this week.    Time 12    Period Weeks  Status Deferred      PT LONG TERM GOAL #5   Title Patient will ambulate at least 10 feet with min A using LRAD to improve mobility for household and community distances.    Baseline unable to take steps (06/12/2018); able to shift R leg forward and back with max A and RW at edge of plinth (06/19/2018); 07/08/18: unable to  ambulate at this time. 09/11/18: unable to perform at this time; 10/23/2018: unable to perform at this time; 10/12: unable to perform at this time, 10/29: unable at this time; 01/13/19: unable at this time, 2/11: min A to step with RLE, unable to step with LLE, 3/10: able to take 2 steps in parallel bars with min A for safety; 4/1: min A for walking 10 feet in parallel bars, 5/24: min A walking 10 feet in parallel bars; 6/21: no change 7/14: no change. 03/01/2020- Patient unable to ambulate and currently focusing on static standing. He was able to take a forward step with his right LE last week but unable today. 03/31/2020- Patient continues to be non-ambulatory but has been able to move right LE forward with standing but unable to take a step with left LE. 04/19/2020- Goal is still not appropriate as of yet- cont to focus on static and dynamic standing balance/pregait activities.    Time 12    Period Weeks    Status Deferred    Target Date 07/12/20      PT LONG TERM GOAL #6   Title Patient will be able to safety navigate up/down ramp with power chair, exhibiting good safety awareness, mod I to safely enter/exit his home.    Baseline 2/11: supervision with intermittent min A;4/1: supervision; 5/24: supervision. 03/01/2020- patient reports that he is able to navigate down the ramp at home to leave the home but continues to need assist to navigate up the ramp into the home.    Time 12    Period Weeks    Status Partially Met      PT LONG TERM GOAL #7   Title Patient will increase functional reach test to >15 inches in sitting to exhibit improved sitting balance and positioning and reduce fall risk;    Baseline 07/30/18: 12 inches; 09/11/18: 10-12 inches; 10/23/18: 12-13 inch; 10/12: 14.5", 10/29: 15.5 inch    Time 4    Period Weeks    Status Achieved      PT LONG TERM GOAL #8   Title Patient will increase LLE quad strength to 3+/5 to improve functional strength for standing and mobility;    Baseline 10/28:  2+/5; 01/13/19: 2+/5, 2/11: 3-/5, 04/16/19: 3-/5, 05/08/19: 3-/5, 5/24: 3-/5, 6/21: 3-/5, 7/14: 3-/5, 12/21: 3-/5. 03/01/2020- Left quad= 3-/5 (focusing on quad strengthening during visits and with home program). 03/31/2020- left quad = 3-/5 with MMT. 04/19/2020= 3- /5 with left quad strength with manual muscle testing.    Time 12    Period Weeks    Status On-going    Target Date 07/12/20      PT LONG TERM GOAL  #9   TITLE Patient will report no vertigo with provoking motions or positions in order to be able to perform ADLs and mobility tasks without symptoms.    Baseline 06/30/19: reports no dizziness in last week; 6/21: one episode this weekend; 7/14: experiencing one a week; 03/01/2020.- Patient has denied any dizziness for over past 3 weeks.    Time 12    Period Weeks    Status Achieved  PT LONG TERM GOAL  #10   TITLE Patient will demonstrate improved functional LE/Postural strength as seen by ability to perform static standing for 2 min or greater with max assist  to assist with strength required for ADL's and transfers/pregait/gait activities.    Baseline 03/01/2020- Patient able to stand approx 40 sec today with max assist in parallel bars. 03/31/2020- Patient able to demo static stand with max A for 77min 47 sec today. 04/19/2020- Patient able to demo 2 min today with mod/max assist - yet remains inconsistent.    Time 8    Period Weeks    Status Partially Met    Target Date 07/12/20                 Plan - 05/31/20 1512    Clinical Impression Statement Patient improved to more dynamic standing - able to weight shift today and continues to exhibit more consistent times with prolonged standing today. He continues to exhibit some distractibility but able to follow cues with increased overall prompting.   He will continue to benefit from skilled PT services to improve his functional strength and postural control for improved transfers/standing endurance    Personal Factors and  Comorbidities Age;Comorbidity 3+    Comorbidities Relevant past medical history and comorbidities include long term steroid use for lupus, CKD, chronic osteomyelitis, cervical spine stenosis, BPH, APS, alcohol abuse, peripheral artery disease, depression, GERD, obstructive sleep apnea, HTN, IBS, IgA deficiency, lumbar radiculopathy, RA, Stroke, toxic maculopathy in both eyes, systemic lupus erythematosus related syndrome, cardiac catheterization, carotid endarterectomy, cervical laminectomy, coronary angioplasty, Fusion C5-C7, knee arthroplasty, lumbar surgery.    Examination-Activity Limitations Bed Mobility;Bend;Sit;Toileting;Stand;Stairs;Lift;Transfers;Squat;Locomotion Level;Carry;Dressing;Hygiene/Grooming;Continence    Examination-Participation Restrictions Yard Work;Interpersonal Relationship;Community Activity    Stability/Clinical Decision Making Evolving/Moderate complexity    Rehab Potential Fair    PT Frequency 2x / week    PT Duration 12 weeks    PT Treatment/Interventions ADLs/Self Care Home Management;Electrical Stimulation;Moist Heat;Cryotherapy;Gait training;Stair training;Functional mobility training;Therapeutic exercise;Balance training;Neuromuscular re-education;Cognitive remediation;Patient/family education;Orthotic Fit/Training;Wheelchair mobility training;Manual techniques;Passive range of motion;Energy conservation;Joint Manipulations;Canalith Repostioning;Therapeutic activities;Vestibular    PT Next Visit Plan Continue with progressive postural/LE strengthening/pregait activities    PT Home Exercise Plan no changes    Consulted and Agree with Plan of Care Patient;Family member/caregiver    Family Member Consulted Dtr.  [name]           Patient will benefit from skilled therapeutic intervention in order to improve the following deficits and impairments:  Abnormal gait,Decreased activity tolerance,Decreased cognition,Decreased endurance,Decreased knowledge of use of  DME,Decreased range of motion,Decreased skin integrity,Decreased strength,Impaired perceived functional ability,Impaired sensation,Impaired UE functional use,Improper body mechanics,Pain,Cardiopulmonary status limiting activity,Decreased balance,Decreased coordination,Decreased mobility,Difficulty walking,Impaired tone,Postural dysfunction,Dizziness  Visit Diagnosis: Abnormality of gait and mobility  Difficulty in walking, not elsewhere classified  Muscle weakness (generalized)  Other lack of coordination     Problem List Patient Active Problem List   Diagnosis Date Noted  . Sepsis ([place]) 10/18/2018  . Long term (current) use of anticoagulants 05/01/2018  . History of stroke 04/11/2018  . Hyperlipidemia 07/04/2017  . Chronic diarrhea 06/12/2017  . 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 Fatigue 02/06/2015  . Visual changes 06/19/2013  . Benign prostatic hyperplasia 01/07/2013  . Depression 01/07/2013  . Restless legs syndrome 01/07/2013  . Chronic kidney disease, stage 3 unspecified ([place]) 12/20/2011  . CAD (coronary artery disease) 11/03/2010  . Hypertension 11/03/2010  . PAD (peripheral  artery disease) ([place]) 11/03/2010  . SLE (systemic lupus erythematosus) ([place]) 11/03/2010    Lewis Moccasin, PT 06/01/2020, 9:24 AM  [place] MAIN [facility] SERVICES [address] [place], [place], [address] Phone: [phone]   Fax:  [phone]  Name: Kenneth Summers MRN: [id number] Date of Birth: 15-Feb-1945

## 2020-06-02 ENCOUNTER — Ambulatory Visit: Payer: Medicare Other

## 2020-06-02 ENCOUNTER — Encounter: Payer: Self-pay | Admitting: Occupational Therapy

## 2020-06-02 ENCOUNTER — Other Ambulatory Visit: Payer: Self-pay

## 2020-06-02 ENCOUNTER — Ambulatory Visit: Payer: Medicare Other | Admitting: Occupational Therapy

## 2020-06-02 DIAGNOSIS — R269 Unspecified abnormalities of gait and mobility: Secondary | ICD-10-CM

## 2020-06-02 DIAGNOSIS — R262 Difficulty in walking, not elsewhere classified: Secondary | ICD-10-CM

## 2020-06-02 DIAGNOSIS — M6281 Muscle weakness (generalized): Secondary | ICD-10-CM | POA: Diagnosis not present

## 2020-06-02 DIAGNOSIS — R278 Other lack of coordination: Secondary | ICD-10-CM

## 2020-06-02 NOTE — Therapy (Signed)
[place] [facility] SERVICES [address] [place], [place], [address] Phone: [phone]   Fax:  [phone]  Occupational Therapy Treatment  Patient Details  Name: CODA MATHEY MRN: [id number] Date of Birth: 1946/01/05 No data recorded  Encounter Date: 06/02/2020   OT End of Session - 06/02/20 1412    Visit Number [id number]    Number of Visits 254    Date for OT Re-Evaluation 08/16/20    Authorization Type Progress report period starting 05/31/2020    OT Start Time 1345    OT Stop Time 1430    OT Time Calculation (min) 45 min    Activity Tolerance Patient tolerated treatment well    Behavior During Therapy [facility] for tasks assessed/performed           Past Medical History:  Diagnosis Date  . Actinic keratosis   . Alcohol abuse   . APS (antiphospholipid syndrome) ([place])   . Basal cell carcinoma   . BPH (benign prostatic hyperplasia)   . CAD (coronary artery disease)   . Cellulitis   . Cervical spinal stenosis   . Chronic osteomyelitis ([place])   . CKD (chronic kidney disease)   . CKD (chronic kidney disease)   . Clostridium difficile diarrhea   . Depression   . Diplopia   . Fatigue   . GERD (gastroesophageal reflux disease)   . Hyperlipemia   . Hypertension   . Hypovitaminosis D   . IBS (irritable bowel syndrome)   . 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 Insomnia   . Left lumbosacral radiculopathy   . Moderate obstructive sleep apnea   . Osteomyelitis of foot ([place])   . PAD (peripheral artery disease) ([place])   . Pruritus   . RA (rheumatoid arthritis) ([place])   . Radiculopathy   . Restless legs syndrome   . RLS (restless legs syndrome)   . SLE (systemic lupus erythematosus related syndrome) ([place])   . Squamous cell carcinoma of skin 01/27/2019   right mid lower ear helix  . Stroke ([place])   . Toxic maculopathy of both eyes     Past Surgical History:  Procedure Laterality Date  . CARDIAC CATHETERIZATION    . CAROTID ENDARTERECTOMY    .  CERVICAL LAMINECTOMY    . CORONARY ANGIOPLASTY    . FRACTURE SURGERY    . fusion C5-6-7    . HERNIA REPAIR    . KNEE ARTHROSCOPY    . TONSILLECTOMY      There were no vitals filed for this visit.   Subjective Assessment - 06/02/20 1411    Subjective  Pt. reports 4/10 right lower quadrant pain.    Patient is accompanied by: Family member    Pertinent History Pt. is a 75 y.o. 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 Pt. PMHx includes: Multiple Falls, Lupus, DJD, Renal Abscess, CVA. Pt. resides with his wife. Pt.'s wife and daughter assist with ADLs. Pt. has caregivers in  for 2 hours a day, 6 days a week. Pt. received Rehab services in acute care, at SNF for STR, and [place] services. Pt. is retired from [employer] for [employer] and [employer].    Currently in Pain? No/denies          OT TREATMENT   Therapeutic Exercise:  Pt. worked on [employer], and reciprocal motion using the UBE while seated for 8 min. with no resistance. Constant monitoring was provided. A towel roll was  placed at the left lateral trunk secondary to left lateral leaning. Pt. performed3#dowelfor UE strengthening secondary to weakness.Pt.tolerated increased dowel weight.Bilateral shoulder flexion, chest press,andcircular patterns,were performedfor1-2 sets10-20reps eachwith cues to keep the dowel symmetrical, and level.Pt. worked on 3# dumbbell ex. forrightelbow flexion and extension,supination/pronation, wrist extension, and radial deviation, left elbow flexion, extension, and supination/pronation, wrist extension. BUE strengthening with red theraband for bilateral horizontal abduction, elbow extension.  Pt.continuestomakesteadyprogress with BUE strength, and tolerance for there.ex. Pt.requiredfrequent verbal cues, and cues for visual demonstrationfor movement patterns, however required cues for visual demonstration. Pt. Required verbal, and  tactile cues for theraband exercises.Pt.continues to require consistent verbal, visual, and tactile cues.Pt.continues to require cues for radial deviation.Pt.continues torequire less cuesto perform UE strengthening exercises, technique, and formas well as visual demonstration prior to the exercises.Pt. continues to work on improving BUE strength, and [facility] skills in order to improve LUE functioning during ADLs, IADLs, and tranfers.                      OT Education - 06/02/20 1412    Education Details strengthening, UE functioning    Person(s) Educated Patient    Methods Explanation;Demonstration;Handout    Comprehension Returned demonstration;Tactile cues required;Verbalized understanding;Need further instruction               OT Long Term Goals - 05/31/20 1355      OT LONG TERM GOAL #1   Title Pt. will increase left shoulder flexion AROM by 10 degrees to access cabinet/shelf.    Baseline 05/31/2020: Left shoulder flexion 119 degrees. Pt. is able to reach into cabinets with more ease, however has difficulty getting close enough to them with his w/c.Pt. continues to present with limited left shoulder ROM limitng access to cabinets, and shelves.03/01/2020: Left shoulder 116 degrees with new onset of numbness radiating from the left side of the neck to the hand. Pt. continues to progress with left shoulder ROM .Shouler flexion 130, and continues to work on reaching with increasing emphasis on flexing trunk to improve functional reach.  07/21/19- shoulder flexion to 130 with effort    Time 12    Period Weeks    Status On-going    Target Date 08/16/20      OT LONG TERM GOAL #2   Title Pt. will donn a shirt with Supervision.    Baseline 05/31/2020: [place]. Pt. is assisting more with UE dressing at home. Pt. continues to require modA donning a zip down jacket with consistent cues to initiate the correct direction of the jacket.  Min-modA mahaging the zipper. MinA for donning  a T-shirt with assist to roll side to side to pull the shirt down over his back. 07/21/19 continues to require min assist at times for shirt/jacket    Time 12    Status On-going    Target Date 08/16/20      OT LONG TERM GOAL #4   Title Pt. will improve left grip strength by 10# to be able to open a jar/container.    Baseline Grip strength cotitnues to be limited. Decreased left sided grip strength today secondary to a new onset of numbenss, and weakness radiating from the neck to the left hand. Pt. continues to have a harder time opening a wide mouthed jar, however is able to open a smaller jar easier.    Time 12    Period Weeks    Status On-going    Target Date 08/16/20      OT LONG  TERM GOAL #5   Title Pt. will improve left hand [facility] skills to be able to assist with buttoning/zipping.    Baseline 05/24/2020: Pt. is improving with fastening small buttons. Pt. continues to work on improving [facility] skills  to improve buttoning , and zipping.03/01/2020: Pt.has had a new onset of weakness, and numbness in the left hand beginning upon waking up this morning which has resulted in a significant change in [facility] skills.  Pt. has improved overall donning a jacket, and  manipulating the zipper on his jacket, however continues to require practice depending on the type of zipper jacket. Pt. continues to work on improving bilateral [facility] skills for buttoning/zipping.  07/21/19 improving with buttons and zippers.    Time 12    Period Weeks    Status On-going    Target Date 08/16/20      OT LONG TERM GOAL #6   Title Pt. will demonstrate visual conmpensatory strategies for 100% of the time during ADLs    Baseline Improving, however Pt. continues to require intermittent cues cues for left sides awareness.    Time 12    Period Weeks    Status On-going    Target Date 08/16/20      OT LONG TERM GOAL #8   Title Pt. will accurately identify potential safety hazard using good safety awareness, and judgement 100% for  ADLs, and IADLs.    Baseline Pt. continues to require cues    Time 12    Period Weeks    Status On-going    Target Date 08/16/20      OT LONG TERM GOAL  #9   TITLE Pt. will  navigate his w/c around obstacles with Supervision and 100% accuracy    Baseline Pt. has progressed, however, continues to require cuing for obstacles on the left with fewer  cues overall. Pt. requires significant cues to engage his left UE during tasks. 07/21/2019 continues to improve with decreased assist at times    Time 12    Period Weeks    Status On-going    Target Date 08/16/20      OT LONG TERM GOAL  #10   TITLE Pt. will independently be able  to reach to place items into cabinetry, and closets.    Baseline Pt. continues to have difficulty with functional reaching continues to be limited.    Time 12    Period Weeks    Status On-going    Target Date 08/16/20      OT LONG TERM GOAL  #11   TITLE Pt. will increase BUE strength by 95mm grades in preparation for ADL transfers.    Baseline 05/31/2020: Pt. is progressing, however BUE strength continues to be limited. Pt. is able to tolerate increased resistive weights. Pt. continues to work towards improving overall strength for ADLs.    Time 12    Period Weeks    Status On-going    Target Date 08/16/20      OT LONG TERM GOAL  #12   TITLE Pt. will improve LUE functional reaching with the left engaging the trunk 100% of the time during ADL tasks with minimal cues.    Baseline Left sided numbness has improved. New onset of left sided numbness, and weakness is affecting his ability to perform reaching.    Time 12    Period Weeks    Status On-going    Target Date 08/16/20  Plan - 06/02/20 1413    Clinical Impression Statement Pt.continuestomakesteadyprogress with BUE strength, and tolerance for there.ex. Pt.requiredfrequent verbal cues, and cues for visual demonstrationfor movement patterns, however required cues for visual  demonstration. Pt. Required verbal, and tactile cues for theraband exercises.Pt.continues to require consistent verbal, visual, and tactile cues.Pt.continues to require cues for radial deviation.Pt.continues torequire less cuesto perform UE strengthening exercises, technique, and formas well as visual demonstration prior to the exercises.Pt. continues to work on improving BUE strength, and [facility] skills in order to improve LUE functioning during ADLs, IADLs, and tranfers.     OT Occupational Profile and History Problem Focused Assessment - Including review of records relating to presenting problem    Occupational performance deficits (Please refer to evaluation for details): ADL's    Body Structure / Function / Physical Skills UE functional use;Coordination;FMC;Dexterity;Strength;ROM    Cognitive Skills Attention;Memory;Emotional;Problem Solve;Safety Awareness    Rehab Potential Good    Clinical Decision Making Several treatment options, min-mod task modification necessary    Comorbidities Affecting Occupational Performance: Presence of comorbidities impacting occupational performance    Comorbidities impacting occupational performance description: Phyical, cognitive, visual,  medical comorbidities    Modification or Assistance to Complete Evaluation  No modification of tasks or assist necessary to complete eval    OT Frequency 2x / week    OT Duration 12 weeks    OT Treatment/Interventions Self-care/ADL training;DME and/or AE instruction;Therapeutic exercise;Therapeutic activities;Moist Heat;Cognitive remediation/compensation;Neuromuscular education;Visual/perceptual remediation/compensation;Coping strategies training;Patient/family education;Passive range of motion;Psychosocial skills training;Energy conservation;Functional Mobility Training    Consulted and Agree with Plan of Care Patient;Family member/caregiver           Patient will benefit from skilled therapeutic intervention in  order to improve the following deficits and impairments:   Body Structure / Function / Physical Skills: UE functional use,Coordination,FMC,Dexterity,Strength,ROM Cognitive Skills: Attention,Memory,Emotional,Problem Solve,Safety Awareness     Visit Diagnosis: Muscle weakness (generalized)  Other lack of coordination    Problem List Patient Active Problem List   Diagnosis Date Noted  . Sepsis ([place]) 10/18/2018  . Long term (current) use of anticoagulants 05/01/2018  . History of stroke 04/11/2018  . Hyperlipidemia 07/04/2017  . Chronic diarrhea 06/12/2017  . 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) 04/27/2016  . 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 Fatigue 02/06/2015  . Visual changes 06/19/2013  . Benign prostatic hyperplasia 01/07/2013  . Depression 01/07/2013  . Restless legs syndrome 01/07/2013  . Chronic kidney disease, stage 3 unspecified ([place]) 12/20/2011  . CAD (coronary artery disease) 11/03/2010  . Hypertension 11/03/2010  . PAD (peripheral artery disease) ([place]) 11/03/2010  . SLE (systemic lupus erythematosus) ([place]) 11/03/2010    Harrel Carina, MS, OTR/L 06/02/2020, 2:18 PM  [place] [facility] SERVICES [address] [place], [place], [address] Phone: [phone]   Fax:  [phone]  Name: RAKEEM COLLEY MRN: [id number] Date of Birth: 23-Aug-1945

## 2020-06-02 NOTE — Therapy (Signed)
[place] MAIN [facility] SERVICES [address] [place], [place], [address] Phone: [phone]   Fax:  [phone]  Physical Therapy Treatment  Patient Details  Name: Kenneth Summers MRN: [id number] Date of Birth: October 24, 1945 No data recorded  Encounter Date: 06/02/2020   PT End of Session - 06/02/20 1541    Visit Number [id number]    Number of Visits 276    Date for PT Re-Evaluation 07/12/20    Authorization Type Medicare reporting period starting 09/11/18; NO FOTO    Authorization Time Period Recert 6/96/2952-09/10/1322    PT Start Time 1302    PT Stop Time 1340    PT Time Calculation (min) 38 min    Equipment Utilized During Treatment Gait belt    Activity Tolerance No increased pain;Patient limited by fatigue;Patient tolerated treatment well    Behavior During Therapy [facility] for tasks assessed/performed           Past Medical History:  Diagnosis Date  . Actinic keratosis   . Alcohol abuse   . APS (antiphospholipid syndrome) ([place])   . Basal cell carcinoma   . BPH (benign prostatic hyperplasia)   . CAD (coronary artery disease)   . Cellulitis   . Cervical spinal stenosis   . Chronic osteomyelitis ([place])   . CKD (chronic kidney disease)   . CKD (chronic kidney disease)   . Clostridium difficile diarrhea   . Depression   . Diplopia   . Fatigue   . GERD (gastroesophageal reflux disease)   . Hyperlipemia   . Hypertension   . Hypovitaminosis D   . IBS (irritable bowel syndrome)   . 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 Insomnia   . Left lumbosacral radiculopathy   . Moderate obstructive sleep apnea   . Osteomyelitis of foot ([place])   . PAD (peripheral artery disease) ([place])   . Pruritus   . RA (rheumatoid arthritis) ([place])   . Radiculopathy   . Restless legs syndrome   . RLS (restless legs syndrome)   . SLE (systemic lupus erythematosus related syndrome) ([place])   . Squamous cell carcinoma of skin 01/27/2019   right mid lower ear helix  . Stroke ([place])    . Toxic maculopathy of both eyes     Past Surgical History:  Procedure Laterality Date  . CARDIAC CATHETERIZATION    . CAROTID ENDARTERECTOMY    . CERVICAL LAMINECTOMY    . CORONARY ANGIOPLASTY    . FRACTURE SURGERY    . fusion C5-6-7    . HERNIA REPAIR    . KNEE ARTHROSCOPY    . TONSILLECTOMY      There were no vitals filed for this visit.   Subjective Assessment - 06/02/20 1533    Subjective Patient reports doing okay today. Reports fatigued but willing to try anything today.    Patient is accompained by: Family member    Pertinent History Patient is a 75 y.o. male who presents to outpatient physical therapy with a referral for medical diagnosis of CVA. This patient's chief complaints consist of left hemiplegia and overall deconditioning leading to the following functional deficits: dependent for ADLs, IADLs, unable to transfer to car, dependent transfers at home with hoyer lift, unable to walk or stand without significant assistance, difficulty with W/C navigation..    Limitations Sitting;Lifting;Standing;House hold activities;Writing;Walking;Other (comment)    Diagnostic tests Brain MRI 11/01/2017: IMPRESSION: 1. Acute right ACA territory infarct as demonstrated on prior CT imaging. No intracranial hemorrhage or significant mass effect.  2. 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    Patient Stated Goals wants to be able to walk again    Currently in Pain? No/denies    Pain Onset Today    Pain Onset Today            Interventions:   Transfer training: powerchair to mat- Patient required mod assist to scoot forward and max VCs. He then required Max A to stand and pivot from chair to mat- difficulty pivoting with minimal movement on left LE yet successful. Same technique on way back- position mat up higher to transition from higher to lower surface. Patient with difficulty standing yet able to stand long enough to transfer.  Static standing  using walker with max assist of 2 persons- 1 on each side- 3 trials 1) 37 sec 2) 30 sec 3) 39 sec. 4) 42 sec- Patient struggled more holding onto walker today vs his normal of standing inside the //bars. He lacked confidence requiring more assist with standing.   Clinical Impression: Patient was able to transfer today with max assist and able to stand using walker as previously unable. He struggled to stand while placing weight on hands of walker yet because this is a new tasks he appeared to lack confidence but did improve overall with practice. He will continue to benefit from skilled PT services to improve his functional strength and postural control for improved transfers/standing endurance                         PT Education - 06/02/20 1540    Education Details Standing and transfer cues using walker; Transfer cues from mat to w/c    Person(s) Educated Patient    Methods Explanation;Demonstration;Tactile cues;Verbal cues    Comprehension Verbalized understanding;Verbal cues required;Need further instruction;Tactile cues required;Returned demonstration            PT Short Term Goals - 04/19/20 2317      PT SHORT TERM GOAL #1   Title Be independent with initial home exercise program for self-management of symptoms.    Baseline HEP to be given at second session; advice to practice unsupported sitting at home (06/19/2018); 07/08/18: Performing at home, 6/29: needs assistance but is doing exercise program regularly; 08/22/18: adherent;    Time 2    Period Weeks    Status Achieved    Target Date 06/26/18      PT SHORT TERM GOAL #2   Title Patient will perform sit to stand transfer with mod Assist in parallel bars for improved transfer ability with decreased caregiver assist.    Baseline 04/19/2020- Max assist of 1 person    Time 6    Period Weeks    Status New    Target Date 05/31/20             PT Long Term Goals - 04/19/20 1321      PT LONG TERM GOAL #1    Title Patient will complete all bed mobility with min A to improve functional independence for getting in and out of bed and adjusting in bed.     Baseline max A - total A reported by family (06/19/2018); 07/08/18: still requires maxA, dtr reports improvement in rolling since starting therapy; 6/23: min A to supervision in clinic; not doing at home right; 8/5: Pt's daughter states that his rolling has improved, but still has difficulty with all other bed mobility; 10/23/18: pts daughter states  that she is doing about 35% of the work if he is in proper position, he does uses the bed rails to help roll;  using Harrel Lemon for coming to sit EOB; 10/12: pts daughter states that she is doing about 30% of the work if he is in proper position, he does uses the bed rails to help roll, but it is improving;  using Harrel Lemon for coming to sit EOB, 10/29: min A for sit to sidelying, rolling supervision, able to transition sidelying to sit with HHA; 12/21: min-mod A. 03/01/2020- Patient presents improving bed mobility with current ability to roll to left with CGA and verbal and tactile cues and min assist to roll to right as seen by recent visits. 03/31/2020- Patient and caregiver report that he has maintained ability to roll side to side with only minimal assistance.    Time 12    Period Weeks    Status Achieved      PT LONG TERM GOAL #2   Title Patient will complete sit <> stand transfer chair to chair with Min A and LRAD to improve functional independence for household and community mobility.     Baseline min A for STS, still requires elevated surface height to 27".  03/01/20- Patient currently unable to perform a chair to chair transfer and continues to be transferred using hoyer lift. Patient has been training with sit to stand at parallel bars and able to currently perform sit to stand with max assist  from 27in. high surface.03/31/2020- Patient remains at [facility] assist with sit to stand transfer and continued use of hoyer lift  transfer for all chair to chair or bed to chair transfers. 04/19/2020- Patient requires Max assist with sit to stand transfers and requires hoyer for chair to chair transfers.    Time 12    Period Weeks    Status On-going    Target Date 07/12/20      PT LONG TERM GOAL #3   Title Patient will navigate power w/c with min A x 100 feet to improve mobility for household and short community distances.    Baseline required total A (06/12/2018); able to roll 20 feet with min A (06/20/2018); 07/08/18: Pt can go approximately 10' without assist per family;  10/23/2018: pt's daughter states that his power WC mobility has become 100% better with the new hand control, slowed speed, and with practice; 10/12: pt's daughter reports that he can perform household WC mobility and limited community ambulation with minA    Time 12    Period Weeks    Status Achieved      PT LONG TERM GOAL #4   Title Patient will complete car transfers W/C to family SUV with min A using LRAD to improve his abilty to participate in community activities.     Baseline unable to complete car transfers at all (06/12/2018); unable to complete car transfers at all (06/19/2018).; 07/08/18: Unable to perform at this time; 09/11/18: unable to perform at this time; 10/23/2018: unable to complete car transfers at all; 10/12:  unable to complete car transfers at all, 10/29: unable; 01/13/19: unable; 2/11: min-mod A +2, 3/10: min A+1, 12/21: mod A. 03/01/2020- Patient unable to complete car transfers and continues to require medical transportation at this time. 3/14- Patient reports he may be purchasing a conversion van later this week.    Time 12    Period Weeks    Status Deferred      PT LONG TERM GOAL #5   Title  Patient will ambulate at least 10 feet with min A using LRAD to improve mobility for household and community distances.    Baseline unable to take steps (06/12/2018); able to shift R leg forward and back with max A and RW at edge of plinth (06/19/2018);  07/08/18: unable to ambulate at this time. 09/11/18: unable to perform at this time; 10/23/2018: unable to perform at this time; 10/12: unable to perform at this time, 10/29: unable at this time; 01/13/19: unable at this time, 2/11: min A to step with RLE, unable to step with LLE, 3/10: able to take 2 steps in parallel bars with min A for safety; 4/1: min A for walking 10 feet in parallel bars, 5/24: min A walking 10 feet in parallel bars; 6/21: no change 7/14: no change. 03/01/2020- Patient unable to ambulate and currently focusing on static standing. He was able to take a forward step with his right LE last week but unable today. 03/31/2020- Patient continues to be non-ambulatory but has been able to move right LE forward with standing but unable to take a step with left LE. 04/19/2020- Goal is still not appropriate as of yet- cont to focus on static and dynamic standing balance/pregait activities.    Time 12    Period Weeks    Status Deferred    Target Date 07/12/20      PT LONG TERM GOAL #6   Title Patient will be able to safety navigate up/down ramp with power chair, exhibiting good safety awareness, mod I to safely enter/exit his home.    Baseline 2/11: supervision with intermittent min A;4/1: supervision; 5/24: supervision. 03/01/2020- patient reports that he is able to navigate down the ramp at home to leave the home but continues to need assist to navigate up the ramp into the home.    Time 12    Period Weeks    Status Partially Met      PT LONG TERM GOAL #7   Title Patient will increase functional reach test to >15 inches in sitting to exhibit improved sitting balance and positioning and reduce fall risk;    Baseline 07/30/18: 12 inches; 09/11/18: 10-12 inches; 10/23/18: 12-13 inch; 10/12: 14.5", 10/29: 15.5 inch    Time 4    Period Weeks    Status Achieved      PT LONG TERM GOAL #8   Title Patient will increase LLE quad strength to 3+/5 to improve functional strength for standing and mobility;     Baseline 10/28: 2+/5; 01/13/19: 2+/5, 2/11: 3-/5, 04/16/19: 3-/5, 05/08/19: 3-/5, 5/24: 3-/5, 6/21: 3-/5, 7/14: 3-/5, 12/21: 3-/5. 03/01/2020- Left quad= 3-/5 (focusing on quad strengthening during visits and with home program). 03/31/2020- left quad = 3-/5 with MMT. 04/19/2020= 3- /5 with left quad strength with manual muscle testing.    Time 12    Period Weeks    Status On-going    Target Date 07/12/20      PT LONG TERM GOAL  #9   TITLE Patient will report no vertigo with provoking motions or positions in order to be able to perform ADLs and mobility tasks without symptoms.    Baseline 06/30/19: reports no dizziness in last week; 6/21: one episode this weekend; 7/14: experiencing one a week; 03/01/2020.- Patient has denied any dizziness for over past 3 weeks.    Time 12    Period Weeks    Status Achieved      PT LONG TERM GOAL  #10   TITLE Patient will  demonstrate improved functional LE/Postural strength as seen by ability to perform static standing for 2 min or greater with max assist  to assist with strength required for ADL's and transfers/pregait/gait activities.    Baseline 03/01/2020- Patient able to stand approx 40 sec today with max assist in parallel bars. 03/31/2020- Patient able to demo static stand with max A for 48min 47 sec today. 04/19/2020- Patient able to demo 2 min today with mod/max assist - yet remains inconsistent.    Time 8    Period Weeks    Status Partially Met    Target Date 07/12/20                 Plan - 06/02/20 1541    Clinical Impression Statement Patient was able to transfer today with max assist and able to stand using walker as previously unable. He struggled to stand while placing weight on hands of walker yet because this is a new tasks he appeared to lack confidence but did improve overall with practice. He will continue to benefit from skilled PT services to improve his functional strength and postural control for improved transfers/standing endurance     Personal Factors and Comorbidities Age;Comorbidity 3+    Comorbidities Relevant past medical history and comorbidities include long term steroid use for lupus, CKD, chronic osteomyelitis, cervical spine stenosis, BPH, APS, alcohol abuse, peripheral artery disease, depression, GERD, obstructive sleep apnea, HTN, IBS, IgA deficiency, lumbar radiculopathy, RA, Stroke, toxic maculopathy in both eyes, systemic lupus erythematosus related syndrome, cardiac catheterization, carotid endarterectomy, cervical laminectomy, coronary angioplasty, Fusion C5-C7, knee arthroplasty, lumbar surgery.    Examination-Activity Limitations Bed Mobility;Bend;Sit;Toileting;Stand;Stairs;Lift;Transfers;Squat;Locomotion Level;Carry;Dressing;Hygiene/Grooming;Continence    Examination-Participation Restrictions Yard Work;Interpersonal Relationship;Community Activity    Stability/Clinical Decision Making Evolving/Moderate complexity    Rehab Potential Fair    PT Frequency 2x / week    PT Duration 12 weeks    PT Treatment/Interventions ADLs/Self Care Home Management;Electrical Stimulation;Moist Heat;Cryotherapy;Gait training;Stair training;Functional mobility training;Therapeutic exercise;Balance training;Neuromuscular re-education;Cognitive remediation;Patient/family education;Orthotic Fit/Training;Wheelchair mobility training;Manual techniques;Passive range of motion;Energy conservation;Joint Manipulations;Canalith Repostioning;Therapeutic activities;Vestibular    PT Next Visit Plan Continue with progressive postural/LE strengthening/pregait activities    PT Home Exercise Plan no changes    Consulted and Agree with Plan of Care Patient;Family member/caregiver    Family Member Consulted --           Patient will benefit from skilled therapeutic intervention in order to improve the following deficits and impairments:  Abnormal gait,Decreased activity tolerance,Decreased cognition,Decreased endurance,Decreased knowledge of use  of DME,Decreased range of motion,Decreased skin integrity,Decreased strength,Impaired perceived functional ability,Impaired sensation,Impaired UE functional use,Improper body mechanics,Pain,Cardiopulmonary status limiting activity,Decreased balance,Decreased coordination,Decreased mobility,Difficulty walking,Impaired tone,Postural dysfunction,Dizziness  Visit Diagnosis: Abnormality of gait and mobility  Difficulty in walking, not elsewhere classified  Muscle weakness (generalized)  Other lack of coordination     Problem List Patient Active Problem List   Diagnosis Date Noted  . Sepsis ([place]) 10/18/2018  . Long term (current) use of anticoagulants 05/01/2018  . History of stroke 04/11/2018  . Hyperlipidemia 07/04/2017  . Chronic diarrhea 06/12/2017  . 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 Fatigue 02/06/2015  . Visual changes 06/19/2013  . Benign prostatic hyperplasia 01/07/2013  . Depression 01/07/2013  . Restless legs syndrome 01/07/2013  . Chronic kidney disease, stage 3 unspecified ([place]) 12/20/2011  . CAD (coronary artery disease) 11/03/2010  . Hypertension 11/03/2010  . PAD (peripheral artery disease) ([place]) 11/03/2010  .  SLE (systemic lupus erythematosus) ([place]) 11/03/2010    Lewis Moccasin, PT 06/03/2020, 4:59 PM  [place] MAIN [facility] SERVICES [address] [place], [place], [address] Phone: [phone]   Fax:  [phone]  Name: JACARIUS HANDEL MRN: [id number] Date of Birth: Mar 07, 1945

## 2020-06-03 ENCOUNTER — Ambulatory Visit: Payer: Medicare Other

## 2020-06-03 ENCOUNTER — Encounter: Payer: Medicare Other | Admitting: Occupational Therapy

## 2020-06-07 ENCOUNTER — Encounter: Payer: Self-pay | Admitting: Occupational Therapy

## 2020-06-07 ENCOUNTER — Other Ambulatory Visit: Payer: Self-pay

## 2020-06-07 ENCOUNTER — Ambulatory Visit: Payer: Medicare Other | Attending: Family Medicine

## 2020-06-07 ENCOUNTER — Ambulatory Visit: Payer: Medicare Other | Admitting: Occupational Therapy

## 2020-06-07 DIAGNOSIS — R278 Other lack of coordination: Secondary | ICD-10-CM

## 2020-06-07 DIAGNOSIS — M6281 Muscle weakness (generalized): Secondary | ICD-10-CM | POA: Diagnosis present

## 2020-06-07 DIAGNOSIS — R269 Unspecified abnormalities of gait and mobility: Secondary | ICD-10-CM

## 2020-06-07 DIAGNOSIS — R262 Difficulty in walking, not elsewhere classified: Secondary | ICD-10-CM

## 2020-06-07 NOTE — Therapy (Signed)
[facility] [address] [place], [place], [address] Phone: [phone]   Fax:  [phone]  Physical Therapy Treatment  Patient Details  Name: Kenneth Summers MRN: [id number] Date of Birth: Aug 23, 1945 No data recorded  Encounter Date: 06/07/2020   PT End of Session - 06/07/20 1309    Visit Number 240    Number of Visits 276    Date for PT Re-Evaluation 07/12/20    Authorization Type Medicare reporting period starting 09/11/18; NO FOTO    Authorization Time Period Recert 10/22/3844-07/11/9933    PT Start Time 1300    PT Stop Time 1343    PT Time Calculation (min) 43 min    Equipment Utilized During Treatment Gait belt    Activity Tolerance No increased pain;Patient limited by fatigue;Patient tolerated treatment well    Behavior During Therapy [facility] for tasks assessed/performed           Past Medical History:  Diagnosis Date  . Actinic keratosis   . Alcohol abuse   . APS (antiphospholipid syndrome) ([place])   . Basal cell carcinoma   . BPH (benign prostatic hyperplasia)   . CAD (coronary artery disease)   . Cellulitis   . Cervical spinal stenosis   . Chronic osteomyelitis ([place])   . CKD (chronic kidney disease)   . CKD (chronic kidney disease)   . Clostridium difficile diarrhea   . Depression   . Diplopia   . Fatigue   . GERD (gastroesophageal reflux disease)   . Hyperlipemia   . Hypertension   . Hypovitaminosis D   . IBS (irritable bowel syndrome)   . 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 Insomnia   . Left lumbosacral radiculopathy   . Moderate obstructive sleep apnea   . Osteomyelitis of foot ([place])   . PAD (peripheral artery disease) ([place])   . Pruritus   . RA (rheumatoid arthritis) ([place])   . Radiculopathy   . Restless legs syndrome   . RLS (restless legs syndrome)   . SLE (systemic lupus erythematosus related syndrome) ([place])   . Squamous cell carcinoma of skin 01/27/2019   right mid lower ear helix  . Stroke ([place])    . Toxic maculopathy of both eyes     Past Surgical History:  Procedure Laterality Date  . CARDIAC CATHETERIZATION    . CAROTID ENDARTERECTOMY    . CERVICAL LAMINECTOMY    . CORONARY ANGIOPLASTY    . FRACTURE SURGERY    . fusion C5-6-7    . HERNIA REPAIR    . KNEE ARTHROSCOPY    . TONSILLECTOMY      There were no vitals filed for this visit.   Subjective Assessment - 06/07/20 1308    Subjective Patient reports doing about the same.    Patient is accompained by: Family member    Pertinent History Patient is a 75 y.o. male who presents to outpatient physical therapy with a referral for medical diagnosis of CVA. This patient's chief complaints consist of left hemiplegia and overall deconditioning leading to the following functional deficits: dependent for ADLs, IADLs, unable to transfer to car, dependent transfers at home with hoyer lift, unable to walk or stand without significant assistance, difficulty with W/C navigation..    Limitations Sitting;Lifting;Standing;House hold activities;Writing;Walking;Other (comment)    Diagnostic tests Brain MRI 11/01/2017: IMPRESSION: 1. Acute right ACA territory infarct as demonstrated on prior CT imaging. No intracranial hemorrhage or significant mass effect. 2. Age advanced global brain atrophy and  small chronic right occipital Infarct.    Patient Stated Goals wants to be able to walk again    Currently in Pain? No/denies    Pain Onset Today    Pain Onset Today             Interventions:   Standing trials with 1 person assist (In front of patient today) 1) 30 sec- max A- Patient with max difficulty extending hips despite VC/TC 2)  1 min 08 sec- Max A to maintain standing- Unable to extend knees or hips well  3) 1 min 10 sec - Same as 2  4)  1 min 03 sec- Mild improvement with trunk extension- less distracted due to second person standing in front of him trying to keep him looking straight ahead and standing up tall.  5) 1 min 11 sec   -Requiring more assist to maintain erect position. Education provided throughout session via VC/TC and demonstration to facilitate movement at target joints and correct muscle activation for all testing and exercises performed.   Clinical Impression: Patient required more physical assistance to maintain standing today but continues to demo more consistent times in standing for improved functional strength and endurance. He will continue to benefit from skilled PT services to improve his functional strength and postural control for improved transfers/standing endurance                                                          PT Education - 06/08/20 0747    Education Details standing postural cues    Person(s) Educated Patient    Methods Explanation;Demonstration;Tactile cues;Verbal cues    Comprehension Verbalized understanding;Verbal cues required;Need further instruction;Returned demonstration;Tactile cues required            PT Short Term Goals - 04/19/20 2317      PT SHORT TERM GOAL #1   Title Be independent with initial home exercise program for self-management of symptoms.    Baseline HEP to be given at second session; advice to practice unsupported sitting at home (06/19/2018); 07/08/18: Performing at home, 6/29: needs assistance but is doing exercise program regularly; 08/22/18: adherent;    Time 2    Period Weeks    Status Achieved    Target Date 06/26/18      PT SHORT TERM GOAL #2   Title Patient will perform sit to stand transfer with mod Assist in parallel bars for improved transfer ability with decreased caregiver assist.    Baseline 04/19/2020- Max assist of 1 person    Time 6    Period Weeks    Status New    Target Date 05/31/20             PT Long Term Goals - 04/19/20 1321      PT LONG TERM GOAL #1   Title Patient will complete all bed mobility with min A to improve functional independence for getting in and  out of bed and adjusting in bed.     Baseline max A - total A reported by family (06/19/2018); 07/08/18: still requires maxA, dtr reports improvement in rolling since starting therapy; 6/23: min A to supervision in clinic; not doing at home right; 8/5: Pt's daughter states that his rolling has improved, but still has difficulty with all other bed mobility; 10/23/18: pts daughter  states that she is doing about 35% of the work if he is in proper position, he does uses the bed rails to help roll;  using Harrel Lemon for coming to sit EOB; 10/12: pts daughter states that she is doing about 30% of the work if he is in proper position, he does uses the bed rails to help roll, but it is improving;  using Harrel Lemon for coming to sit EOB, 10/29: min A for sit to sidelying, rolling supervision, able to transition sidelying to sit with HHA; 12/21: min-mod A. 03/01/2020- Patient presents improving bed mobility with current ability to roll to left with CGA and verbal and tactile cues and min assist to roll to right as seen by recent visits. 03/31/2020- Patient and caregiver report that he has maintained ability to roll side to side with only minimal assistance.    Time 12    Period Weeks    Status Achieved      PT LONG TERM GOAL #2   Title Patient will complete sit <> stand transfer chair to chair with Min A and LRAD to improve functional independence for household and community mobility.     Baseline min A for STS, still requires elevated surface height to 27".  03/01/20- Patient currently unable to perform a chair to chair transfer and continues to be transferred using hoyer lift. Patient has been training with sit to stand at parallel bars and able to currently perform sit to stand with max assist  from 27in. high surface.03/31/2020- Patient remains at [facility] assist with sit to stand transfer and continued use of hoyer lift transfer for all chair to chair or bed to chair transfers. 04/19/2020- Patient requires Max assist with sit to stand  transfers and requires hoyer for chair to chair transfers.    Time 12    Period Weeks    Status On-going    Target Date 07/12/20      PT LONG TERM GOAL #3   Title Patient will navigate power w/c with min A x 100 feet to improve mobility for household and short community distances.    Baseline required total A (06/12/2018); able to roll 20 feet with min A (06/20/2018); 07/08/18: Pt can go approximately 10' without assist per family;  10/23/2018: pt's daughter states that his power WC mobility has become 100% better with the new hand control, slowed speed, and with practice; 10/12: pt's daughter reports that he can perform household WC mobility and limited community ambulation with minA    Time 12    Period Weeks    Status Achieved      PT LONG TERM GOAL #4   Title Patient will complete car transfers W/C to family SUV with min A using LRAD to improve his abilty to participate in community activities.     Baseline unable to complete car transfers at all (06/12/2018); unable to complete car transfers at all (06/19/2018).; 07/08/18: Unable to perform at this time; 09/11/18: unable to perform at this time; 10/23/2018: unable to complete car transfers at all; 10/12:  unable to complete car transfers at all, 10/29: unable; 01/13/19: unable; 2/11: min-mod A +2, 3/10: min A+1, 12/21: mod A. 03/01/2020- Patient unable to complete car transfers and continues to require medical transportation at this time. 3/14- Patient reports he may be purchasing a conversion van later this week.    Time 12    Period Weeks    Status Deferred      PT LONG TERM GOAL #5  Title Patient will ambulate at least 10 feet with min A using LRAD to improve mobility for household and community distances.    Baseline unable to take steps (06/12/2018); able to shift R leg forward and back with max A and RW at edge of plinth (06/19/2018); 07/08/18: unable to ambulate at this time. 09/11/18: unable to perform at this time; 10/23/2018: unable to perform at this  time; 10/12: unable to perform at this time, 10/29: unable at this time; 01/13/19: unable at this time, 2/11: min A to step with RLE, unable to step with LLE, 3/10: able to take 2 steps in parallel bars with min A for safety; 4/1: min A for walking 10 feet in parallel bars, 5/24: min A walking 10 feet in parallel bars; 6/21: no change 7/14: no change. 03/01/2020- Patient unable to ambulate and currently focusing on static standing. He was able to take a forward step with his right LE last week but unable today. 03/31/2020- Patient continues to be non-ambulatory but has been able to move right LE forward with standing but unable to take a step with left LE. 04/19/2020- Goal is still not appropriate as of yet- cont to focus on static and dynamic standing balance/pregait activities.    Time 12    Period Weeks    Status Deferred    Target Date 07/12/20      PT LONG TERM GOAL #6   Title Patient will be able to safety navigate up/down ramp with power chair, exhibiting good safety awareness, mod I to safely enter/exit his home.    Baseline 2/11: supervision with intermittent min A;4/1: supervision; 5/24: supervision. 03/01/2020- patient reports that he is able to navigate down the ramp at home to leave the home but continues to need assist to navigate up the ramp into the home.    Time 12    Period Weeks    Status Partially Met      PT LONG TERM GOAL #7   Title Patient will increase functional reach test to >15 inches in sitting to exhibit improved sitting balance and positioning and reduce fall risk;    Baseline 07/30/18: 12 inches; 09/11/18: 10-12 inches; 10/23/18: 12-13 inch; 10/12: 14.5", 10/29: 15.5 inch    Time 4    Period Weeks    Status Achieved      PT LONG TERM GOAL #8   Title Patient will increase LLE quad strength to 3+/5 to improve functional strength for standing and mobility;    Baseline 10/28: 2+/5; 01/13/19: 2+/5, 2/11: 3-/5, 04/16/19: 3-/5, 05/08/19: 3-/5, 5/24: 3-/5, 6/21: 3-/5, 7/14: 3-/5,  12/21: 3-/5. 03/01/2020- Left quad= 3-/5 (focusing on quad strengthening during visits and with home program). 03/31/2020- left quad = 3-/5 with MMT. 04/19/2020= 3- /5 with left quad strength with manual muscle testing.    Time 12    Period Weeks    Status On-going    Target Date 07/12/20      PT LONG TERM GOAL  #9   TITLE Patient will report no vertigo with provoking motions or positions in order to be able to perform ADLs and mobility tasks without symptoms.    Baseline 06/30/19: reports no dizziness in last week; 6/21: one episode this weekend; 7/14: experiencing one a week; 03/01/2020.- Patient has denied any dizziness for over past 3 weeks.    Time 12    Period Weeks    Status Achieved      PT LONG TERM GOAL  #10   TITLE Patient  will demonstrate improved functional LE/Postural strength as seen by ability to perform static standing for 2 min or greater with max assist  to assist with strength required for ADL's and transfers/pregait/gait activities.    Baseline 03/01/2020- Patient able to stand approx 40 sec today with max assist in parallel bars. 03/31/2020- Patient able to demo static stand with max A for 9mn 47 sec today. 04/19/2020- Patient able to demo 2 min today with mod/max assist - yet remains inconsistent.    Time 8    Period Weeks    Status Partially Met    Target Date 07/12/20                 Plan - 06/07/20 1310    Clinical Impression Statement Patient required more physical assistance to maintain standing today but continues to demo more consistent times in standing for improved functional strength and endurance. He will continue to benefit from skilled PT services to improve his functional strength and postural control for improved transfers/standing endurance    Personal Factors and Comorbidities Age;Comorbidity 3+    Comorbidities Relevant past medical history and comorbidities include long term steroid use for lupus, CKD, chronic osteomyelitis, cervical spine  stenosis, BPH, APS, alcohol abuse, peripheral artery disease, depression, GERD, obstructive sleep apnea, HTN, IBS, IgA deficiency, lumbar radiculopathy, RA, Stroke, toxic maculopathy in both eyes, systemic lupus erythematosus related syndrome, cardiac catheterization, carotid endarterectomy, cervical laminectomy, coronary angioplasty, Fusion C5-C7, knee arthroplasty, lumbar surgery.    Examination-Activity Limitations Bed Mobility;Bend;Sit;Toileting;Stand;Stairs;Lift;Transfers;Squat;Locomotion Level;Carry;Dressing;Hygiene/Grooming;Continence    Examination-Participation Restrictions Yard Work;Interpersonal Relationship;Community Activity    Stability/Clinical Decision Making Evolving/Moderate complexity    Rehab Potential Fair    PT Frequency 2x / week    PT Duration 12 weeks    PT Treatment/Interventions ADLs/Self Care Home Management;Electrical Stimulation;Moist Heat;Cryotherapy;Gait training;Stair training;Functional mobility training;Therapeutic exercise;Balance training;Neuromuscular re-education;Cognitive remediation;Patient/family education;Orthotic Fit/Training;Wheelchair mobility training;Manual techniques;Passive range of motion;Energy conservation;Joint Manipulations;Canalith Repostioning;Therapeutic activities;Vestibular    PT Next Visit Plan Continue with progressive postural/LE strengthening/pregait activities    PT Home Exercise Plan no changes    Consulted and Agree with Plan of Care Patient;Family member/caregiver           Patient will benefit from skilled therapeutic intervention in order to improve the following deficits and impairments:  Abnormal gait,Decreased activity tolerance,Decreased cognition,Decreased endurance,Decreased knowledge of use of DME,Decreased range of motion,Decreased skin integrity,Decreased strength,Impaired perceived functional ability,Impaired sensation,Impaired UE functional use,Improper body mechanics,Pain,Cardiopulmonary status limiting  activity,Decreased balance,Decreased coordination,Decreased mobility,Difficulty walking,Impaired tone,Postural dysfunction,Dizziness  Visit Diagnosis: Abnormality of gait and mobility  Difficulty in walking, not elsewhere classified  Muscle weakness (generalized)  Other lack of coordination     Problem List Patient Active Problem List   Diagnosis Date Noted  . [place]epsis ([place] 10/18/2018  . Long term (current) use of anticoagulants 05/01/2018  . History of stroke 04/11/2018  . Hyperlipidemia 07/04/2017  . Chronic diarrhea 06/12/2017  . 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 Fatigue 02/06/2015  . Visual changes 06/19/2013  . Benign prostatic hyperplasia 01/07/2013  . Depression 01/07/2013  . Restless legs syndrome 01/07/2013  . Chronic kidney disease, stage 3 unspecified ([place] 12/20/2011  . CAD (coronary artery disease) 11/03/2010  . Hypertension 11/03/2010  . PAD (peripheral artery disease) ([place] 11/03/2010  . SLE (systemic lupus erythematosus) ([place] 11/03/2010    JLewis Moccasin PT 06/08/2020, 7:53 AM  [facility] [address]  [place], [place], [address] Phone: [phone]   Fax:  [phone]  Name: Kenneth Summers MRN: [id number] Date of Birth: 1945-11-03

## 2020-06-07 NOTE — Therapy (Signed)
[place] MAIN [facility] SERVICES [address] [place], [place], [address] Phone: [phone]   Fax:  [phone]  Occupational Therapy Treatment  Patient Details  Name: Kenneth Summers MRN: [id number] Date of Birth: March 21, 1945 No data recorded  Encounter Date: 06/07/2020   OT End of Session - 06/07/20 1353    Visit Number [id number]    Number of Visits 254    Date for OT Re-Evaluation 08/16/20    Authorization Type Progress report period starting 05/31/2020    OT Start Time 1347    OT Stop Time 1430    OT Time Calculation (min) 43 min    Activity Tolerance Patient tolerated treatment well    Behavior During Therapy [facility] for tasks assessed/performed           Past Medical History:  Diagnosis Date  . Actinic keratosis   . Alcohol abuse   . APS (antiphospholipid syndrome) ([place])   . Basal cell carcinoma   . BPH (benign prostatic hyperplasia)   . CAD (coronary artery disease)   . Cellulitis   . Cervical spinal stenosis   . Chronic osteomyelitis ([place])   . CKD (chronic kidney disease)   . CKD (chronic kidney disease)   . Clostridium difficile diarrhea   . Depression   . Diplopia   . Fatigue   . GERD (gastroesophageal reflux disease)   . Hyperlipemia   . Hypertension   . Hypovitaminosis D   . IBS (irritable bowel syndrome)   . 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 Insomnia   . Left lumbosacral radiculopathy   . Moderate obstructive sleep apnea   . Osteomyelitis of foot ([place])   . PAD (peripheral artery disease) ([place])   . Pruritus   . RA (rheumatoid arthritis) ([place])   . Radiculopathy   . Restless legs syndrome   . RLS (restless legs syndrome)   . SLE (systemic lupus erythematosus related syndrome) ([place])   . Squamous cell carcinoma of skin 01/27/2019   right mid lower ear helix  . Stroke ([place])   . Toxic maculopathy of both eyes     Past Surgical History:  Procedure Laterality Date  . CARDIAC CATHETERIZATION    . CAROTID ENDARTERECTOMY    .  CERVICAL LAMINECTOMY    . CORONARY ANGIOPLASTY    . FRACTURE SURGERY    . fusion C5-6-7    . HERNIA REPAIR    . KNEE ARTHROSCOPY    . TONSILLECTOMY      There were no vitals filed for this visit.   Subjective Assessment - 06/07/20 1352    Subjective  Pt. reports 4/10 right lower quadrant pain.    Patient is accompanied by: Family member    Pertinent History Pt. is a 75 y.o. 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 Pt. PMHx includes: Multiple Falls, Lupus, DJD, Renal Abscess, CVA. Pt. resides with his wife. Pt.'s wife and daughter assist with ADLs. Pt. has caregivers in  for 2 hours a day, 6 days a week. Pt. received Rehab services in acute care, at SNF for STR, and [facility] services. Pt. is retired from [employer] for [employer] and [employer].    Currently in Pain? No/denies          OT TREATMENT   Therapeutic Exercise:  Pt. worked on [employer], and reciprocal motion using the UBE while seated for 8 min. with no resistance. Constant monitoring was provided. A towel roll was  placed at the left lateral trunk secondary to left lateral leaning. Pt. performed3#dowelfor UE strengthening secondary to weakness.Pt.tolerated increased dowel weight.Bilateral shoulder flexion, chest press,andcircular patterns,were performedfor1-2 sets10-20reps eachwith cues to keep the dowel symmetrical, and level.Pt. worked on 3# dumbbell ex. forrightelbow flexion and extension,supination/pronation, wrist extension, and radial deviation, left elbow flexion, extension, and supination/pronation, wristextension.BUE strengthening with red theraband for bilateral horizontal abduction, elbow extension.  Pt.continuestomakesteadyprogress with BUE strength, and tolerance for there.ex. Pt.requiredfrequent verbalcues, and cues for visual demonstrationfor movement patterns, however required cues for visual demonstration. Pt. required verbal, and  tactile cues for theraband exercises.Pt.continues to require consistent verbal, visual, and tactile cues.Pt.continues torequire cues for radial deviation. Pt. required fewer cues, and less assist was required maintain upright midline sitting while working out on the UBE. Pt.continues torequire less cuesto perform UE strengthening exercises, technique, and formas well as visual demonstration prior to the exercises.Pt. continues to work on improving BUE strength, and [facility] skills in order to improve LUE functioning during ADLs, IADLs, and tranfers.                           OT Education - 06/07/20 1353    Education Details strengthening, UE functioning    Person(s) Educated Patient    Methods Explanation;Demonstration;Handout    Comprehension Returned demonstration;Tactile cues required;Verbalized understanding;Need further instruction               OT Long Term Goals - 05/31/20 1355      OT LONG TERM GOAL #1   Title Pt. will increase left shoulder flexion AROM by 10 degrees to access cabinet/shelf.    Baseline 05/31/2020: Left shoulder flexion 119 degrees. Pt. is able to reach into cabinets with more ease, however has difficulty getting close enough to them with his w/c.Pt. continues to present with limited left shoulder ROM limitng access to cabinets, and shelves.03/01/2020: Left shoulder 116 degrees with new onset of numbness radiating from the left side of the neck to the hand. Pt. continues to progress with left shoulder ROM .Shouler flexion 130, and continues to work on reaching with increasing emphasis on flexing trunk to improve functional reach.  07/21/19- shoulder flexion to 130 with effort    Time 12    Period Weeks    Status On-going    Target Date 08/16/20      OT LONG TERM GOAL #2   Title Pt. will donn a shirt with Supervision.    Baseline 05/31/2020: [place]. Pt. is assisting more with UE dressing at home. Pt. continues to require modA donning a zip  down jacket with consistent cues to initiate the correct direction of the jacket.  Min-modA mahaging the zipper. MinA for donning a T-shirt with assist to roll side to side to pull the shirt down over his back. 07/21/19 continues to require min assist at times for shirt/jacket    Time 12    Status On-going    Target Date 08/16/20      OT LONG TERM GOAL #4   Title Pt. will improve left grip strength by 10# to be able to open a jar/container.    Baseline Grip strength cotitnues to be limited. Decreased left sided grip strength today secondary to a new onset of numbenss, and weakness radiating from the neck to the left hand. Pt. continues to have a harder time opening a wide mouthed jar, however is able to open a smaller jar easier.    Time 12  Period Weeks    Status On-going    Target Date 08/16/20      OT LONG TERM GOAL #5   Title Pt. will improve left hand [facility] skills to be able to assist with buttoning/zipping.    Baseline 05/24/2020: Pt. is improving with fastening small buttons. Pt. continues to work on improving [facility] skills  to improve buttoning , and zipping.03/01/2020: Pt.has had a new onset of weakness, and numbness in the left hand beginning upon waking up this morning which has resulted in a significant change in [facility] skills.  Pt. has improved overall donning a jacket, and  manipulating the zipper on his jacket, however continues to require practice depending on the type of zipper jacket. Pt. continues to work on improving bilateral [facility] skills for buttoning/zipping.  07/21/19 improving with buttons and zippers.    Time 12    Period Weeks    Status On-going    Target Date 08/16/20      OT LONG TERM GOAL #6   Title Pt. will demonstrate visual conmpensatory strategies for 100% of the time during ADLs    Baseline Improving, however Pt. continues to require intermittent cues cues for left sides awareness.    Time 12    Period Weeks    Status On-going    Target Date 08/16/20      OT LONG  TERM GOAL #8   Title Pt. will accurately identify potential safety hazard using good safety awareness, and judgement 100% for ADLs, and IADLs.    Baseline Pt. continues to require cues    Time 12    Period Weeks    Status On-going    Target Date 08/16/20      OT LONG TERM GOAL  #9   TITLE Pt. will  navigate his w/c around obstacles with Supervision and 100% accuracy    Baseline Pt. has progressed, however, continues to require cuing for obstacles on the left with fewer  cues overall. Pt. requires significant cues to engage his left UE during tasks. 07/21/2019 continues to improve with decreased assist at times    Time 12    Period Weeks    Status On-going    Target Date 08/16/20      OT LONG TERM GOAL  #10   TITLE Pt. will independently be able  to reach to place items into cabinetry, and closets.    Baseline Pt. continues to have difficulty with functional reaching continues to be limited.    Time 12    Period Weeks    Status On-going    Target Date 08/16/20      OT LONG TERM GOAL  #11   TITLE Pt. will increase BUE strength by 28mm grades in preparation for ADL transfers.    Baseline 05/31/2020: Pt. is progressing, however BUE strength continues to be limited. Pt. is able to tolerate increased resistive weights. Pt. continues to work towards improving overall strength for ADLs.    Time 12    Period Weeks    Status On-going    Target Date 08/16/20      OT LONG TERM GOAL  #12   TITLE Pt. will improve LUE functional reaching with the left engaging the trunk 100% of the time during ADL tasks with minimal cues.    Baseline Left sided numbness has improved. New onset of left sided numbness, and weakness is affecting his ability to perform reaching.    Time 12    Period Weeks    Status  On-going    Target Date 08/16/20                 Plan - 06/07/20 1355    Clinical Impression Statement Pt.continuestomakesteadyprogress with BUE strength, and tolerance for there.ex.  Pt.requiredfrequent verbalcues, and cues for visual demonstrationfor movement patterns, however required cues for visual demonstration. Pt. required verbal, and tactile cues for theraband exercises.Pt.continues to require consistent verbal, visual, and tactile cues.Pt.continues torequire cues for radial deviation. Pt. required fewer cues, and less assist was required maintain upright midline sitting while working out on the UBE. Pt.continues torequire less cuesto perform UE strengthening exercises, technique, and formas well as visual demonstration prior to the exercises.Pt. continues to work on improving BUE strength, and [facility] skills in order to improve LUE functioning during ADLs, IADLs, and tranfers.   OT Occupational Profile and History Problem Focused Assessment - Including review of records relating to presenting problem    Occupational performance deficits (Please refer to evaluation for details): ADL's    Body Structure / Function / Physical Skills UE functional use;Coordination;FMC;Dexterity;Strength;ROM    Cognitive Skills Attention;Memory;Emotional;Problem Solve;Safety Awareness    Rehab Potential Good    Clinical Decision Making Several treatment options, min-mod task modification necessary    Comorbidities Affecting Occupational Performance: Presence of comorbidities impacting occupational performance    Comorbidities impacting occupational performance description: Phyical, cognitive, visual,  medical comorbidities    Modification or Assistance to Complete Evaluation  No modification of tasks or assist necessary to complete eval    OT Frequency 2x / week    OT Duration 12 weeks    OT Treatment/Interventions Self-care/ADL training;DME and/or AE instruction;Therapeutic exercise;Therapeutic activities;Moist Heat;Cognitive remediation/compensation;Neuromuscular education;Visual/perceptual remediation/compensation;Coping strategies training;Patient/family education;Passive range of  motion;Psychosocial skills training;Energy conservation;Functional Mobility Training    Consulted and Agree with Plan of Care Patient;Family member/caregiver    Family Member Consulted daughter [name]           Patient will benefit from skilled therapeutic intervention in order to improve the following deficits and impairments:   Body Structure / Function / Physical Skills: UE functional use,Coordination,FMC,Dexterity,Strength,ROM Cognitive Skills: Attention,Memory,Emotional,Problem Solve,Safety Awareness     Visit Diagnosis: Muscle weakness (generalized)  Other lack of coordination    Problem List Patient Active Problem List   Diagnosis Date Noted  . Sepsis ([place]) 10/18/2018  . Long term (current) use of anticoagulants 05/01/2018  . History of stroke 04/11/2018  . Hyperlipidemia 07/04/2017  . Chronic diarrhea 06/12/2017  . 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 Fatigue 02/06/2015  . Visual changes 06/19/2013  . Benign prostatic hyperplasia 01/07/2013  . Depression 01/07/2013  . Restless legs syndrome 01/07/2013  . Chronic kidney disease, stage 3 unspecified ([place]) 12/20/2011  . CAD (coronary artery disease) 11/03/2010  . Hypertension 11/03/2010  . PAD (peripheral artery disease) ([place]) 11/03/2010  . SLE (systemic lupus erythematosus) ([place]) 11/03/2010    Harrel Carina, MS, OTR/L 06/07/2020, 1:59 PM  [place] MAIN [facility] SERVICES [address] [place], [place], [address] Phone: [phone]   Fax:  [phone]  Name: DUVON NATIONS MRN: [id number] Date of Birth: [birth year]

## 2020-06-09 ENCOUNTER — Ambulatory Visit: Payer: Medicare Other | Admitting: Occupational Therapy

## 2020-06-09 ENCOUNTER — Other Ambulatory Visit: Payer: Self-pay

## 2020-06-09 ENCOUNTER — Ambulatory Visit: Payer: Medicare Other

## 2020-06-09 DIAGNOSIS — R269 Unspecified abnormalities of gait and mobility: Secondary | ICD-10-CM | POA: Diagnosis not present

## 2020-06-09 DIAGNOSIS — R262 Difficulty in walking, not elsewhere classified: Secondary | ICD-10-CM

## 2020-06-09 DIAGNOSIS — M6281 Muscle weakness (generalized): Secondary | ICD-10-CM

## 2020-06-09 DIAGNOSIS — R278 Other lack of coordination: Secondary | ICD-10-CM

## 2020-06-09 NOTE — Therapy (Signed)
[place] MAIN [facility] SERVICES [address] [place], [place], [address] Phone: [phone]   Fax:  [phone]  Occupational Therapy Treatment  Patient Details  Name: Kenneth Summers MRN: [id number] Date of Birth: [birth year] No data recorded  Encounter Date: 06/09/2020   OT End of Session - 06/09/20 1522    Visit Number [id number]    Number of Visits 254    Date for OT Re-Evaluation 08/16/20    Authorization Type Progress report period starting 05/31/2020    OT Start Time 1345    OT Stop Time 1430    OT Time Calculation (min) 45 min    Activity Tolerance Patient tolerated treatment well    Behavior During Therapy [facility] for tasks assessed/performed           Past Medical History:  Diagnosis Date  . Actinic keratosis   . Alcohol abuse   . APS (antiphospholipid syndrome) ([place])   . Basal cell carcinoma   . BPH (benign prostatic hyperplasia)   . CAD (coronary artery disease)   . Cellulitis   . Cervical spinal stenosis   . Chronic osteomyelitis ([place])   . CKD (chronic kidney disease)   . CKD (chronic kidney disease)   . Clostridium difficile diarrhea   . Depression   . Diplopia   . Fatigue   . GERD (gastroesophageal reflux disease)   . Hyperlipemia   . Hypertension   . Hypovitaminosis D   . IBS (irritable bowel syndrome)   . 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 Insomnia   . Left lumbosacral radiculopathy   . Moderate obstructive sleep apnea   . Osteomyelitis of foot ([place])   . PAD (peripheral artery disease) ([place])   . Pruritus   . RA (rheumatoid arthritis) ([place])   . Radiculopathy   . Restless legs syndrome   . RLS (restless legs syndrome)   . SLE (systemic lupus erythematosus related syndrome) ([employer])   . Squamous cell carcinoma of skin 01/27/2019   right mid lower ear helix  . Stroke ([place])   . Toxic maculopathy of both eyes     Past Surgical History:  Procedure Laterality Date  . CARDIAC CATHETERIZATION    . CAROTID ENDARTERECTOMY    .  CERVICAL LAMINECTOMY    . CORONARY ANGIOPLASTY    . FRACTURE SURGERY    . fusion C5-6-7    . HERNIA REPAIR    . KNEE ARTHROSCOPY    . TONSILLECTOMY      There were no vitals filed for this visit.   Subjective Assessment - 06/09/20 1521    Subjective  Pt. reports being tired follwoing PT    Patient is accompanied by: Family member    Pertinent History Pt. is a 75 y.o. 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 Pt. PMHx includes: Multiple Falls, Lupus, DJD, Renal Abscess, CVA. Pt. resides with his wife. Pt.'s wife and daughter assist with ADLs. Pt. has caregivers in  for 2 hours a day, 6 days a week. Pt. received Rehab services in acute care, at SNF for STR, and [facility] services. Pt. is retired from [employer] for [employer] and [employer].    Currently in Pain? No/denies          OT TREATMENT   Therapeutic Exercise:  Pt.worked on BUE strengthening, and reciprocal motion using the UBE while seated for 8 min. with no resistance. Constant monitoring was provided.A towel roll was placed at the  left lateral trunk secondary to left lateral leaning.Pt. performed3#dowelfor UE strengthening secondary to weakness.Pt.tolerated increased dowel weight.Bilateral shoulder flexion, chest press,andcircular patterns,were performedfor1-2 sets10-20reps eachwith cues to keep the dowel symmetrical, and level.Pt. worked on4# dumbbell ex. forrightelbow flexion and extension,supination/pronation, and 3# for the left. Pt. Worked on 3# for  bilateral wrist extension, and radial deviation, leftelbow flexion, extension, and supination/pronation, wristextension.Pt. worked on grasping large flat shapes, and moving them through a shape tower with progressively increasing heights of vertical dowels.   Pt.reported fatigue following PT. Pt. continuestomakesteadyprogress with BUE strength, and tolerance for there.ex. Pt.requiredfrequent verbalcues,  and cues for visual demonstrationfor movement patterns, however required cues for visual demonstration.Pt. required verbal, and tactile cues for theraband exercises.Pt.continues to require consistent verbal, visual, and tactile cues.Pt.continues torequire cues for radial deviation. Pt. continues to require fewer cues, and less assist to maintain upright midline sitting while working out on the UBE. No assist was needed to maintain upright midline sitting. Pt.continues torequire less cuesto perform UE strengthening exercises, technique, and formas well as visual demonstration prior to the exercises.Pt. continues to work on improving BUE strength, and [facility] skills in order to improve LUE functioning during ADLs, IADLs, and tranfers.                       OT Education - 06/09/20 1522    Education Details strengthening, UE functioning    Person(s) Educated Patient    Methods Explanation;Demonstration;Handout    Comprehension Returned demonstration;Tactile cues required;Verbalized understanding;Need further instruction               OT Long Term Goals - 05/31/20 1355      OT LONG TERM GOAL #1   Title Pt. will increase left shoulder flexion AROM by 10 degrees to access cabinet/shelf.    Baseline 05/31/2020: Left shoulder flexion 119 degrees. Pt. is able to reach into cabinets with more ease, however has difficulty getting close enough to them with his w/c.Pt. continues to present with limited left shoulder ROM limitng access to cabinets, and shelves.03/01/2020: Left shoulder 116 degrees with new onset of numbness radiating from the left side of the neck to the hand. Pt. continues to progress with left shoulder ROM .Shouler flexion 130, and continues to work on reaching with increasing emphasis on flexing trunk to improve functional reach.  07/21/19- shoulder flexion to 130 with effort    Time 12    Period Weeks    Status On-going    Target Date 08/16/20      OT LONG  TERM GOAL #2   Title Pt. will donn a shirt with Supervision.    Baseline 05/31/2020: [place]. Pt. is assisting more with UE dressing at home. Pt. continues to require modA donning a zip down jacket with consistent cues to initiate the correct direction of the jacket.  Min-modA mahaging the zipper. MinA for donning a T-shirt with assist to roll side to side to pull the shirt down over his back. 07/21/19 continues to require min assist at times for shirt/jacket    Time 12    Status On-going    Target Date 08/16/20      OT LONG TERM GOAL #4   Title Pt. will improve left grip strength by 10# to be able to open a jar/container.    Baseline Grip strength cotitnues to be limited. Decreased left sided grip strength today secondary to a new onset of numbenss, and weakness radiating from the neck to the left hand. Pt.  continues to have a harder time opening a wide mouthed jar, however is able to open a smaller jar easier.    Time 12    Period Weeks    Status On-going    Target Date 08/16/20      OT LONG TERM GOAL #5   Title Pt. will improve left hand [facility] skills to be able to assist with buttoning/zipping.    Baseline 05/24/2020: Pt. is improving with fastening small buttons. Pt. continues to work on improving [facility] skills  to improve buttoning , and zipping.03/01/2020: Pt.has had a new onset of weakness, and numbness in the left hand beginning upon waking up this morning which has resulted in a significant change in [facility] skills.  Pt. has improved overall donning a jacket, and  manipulating the zipper on his jacket, however continues to require practice depending on the type of zipper jacket. Pt. continues to work on improving bilateral [facility] skills for buttoning/zipping.  07/21/19 improving with buttons and zippers.    Time 12    Period Weeks    Status On-going    Target Date 08/16/20      OT LONG TERM GOAL #6   Title Pt. will demonstrate visual conmpensatory strategies for 100% of the time during ADLs    Baseline  Improving, however Pt. continues to require intermittent cues cues for left sides awareness.    Time 12    Period Weeks    Status On-going    Target Date 08/16/20      OT LONG TERM GOAL #8   Title Pt. will accurately identify potential safety hazard using good safety awareness, and judgement 100% for ADLs, and IADLs.    Baseline Pt. continues to require cues    Time 12    Period Weeks    Status On-going    Target Date 08/16/20      OT LONG TERM GOAL  #9   TITLE Pt. will  navigate his w/c around obstacles with Supervision and 100% accuracy    Baseline Pt. has progressed, however, continues to require cuing for obstacles on the left with fewer  cues overall. Pt. requires significant cues to engage his left UE during tasks. 07/21/2019 continues to improve with decreased assist at times    Time 12    Period Weeks    Status On-going    Target Date 08/16/20      OT LONG TERM GOAL  #10   TITLE Pt. will independently be able  to reach to place items into cabinetry, and closets.    Baseline Pt. continues to have difficulty with functional reaching continues to be limited.    Time 12    Period Weeks    Status On-going    Target Date 08/16/20      OT LONG TERM GOAL  #11   TITLE Pt. will increase BUE strength by 77mm grades in preparation for ADL transfers.    Baseline 05/31/2020: Pt. is progressing, however BUE strength continues to be limited. Pt. is able to tolerate increased resistive weights. Pt. continues to work towards improving overall strength for ADLs.    Time 12    Period Weeks    Status On-going    Target Date 08/16/20      OT LONG TERM GOAL  #12   TITLE Pt. will improve LUE functional reaching with the left engaging the trunk 100% of the time during ADL tasks with minimal cues.    Baseline Left sided numbness has improved. New  onset of left sided numbness, and weakness is affecting his ability to perform reaching.    Time 12    Period Weeks    Status On-going    Target Date  08/16/20                 Plan - 06/09/20 1522    Clinical Impression Statement Pt.reported fatigue following PT. Pt. continuestomakesteadyprogress with BUE strength, and tolerance for there.ex. Pt.requiredfrequent verbalcues, and cues for visual demonstrationfor movement patterns, however required cues for visual demonstration.Pt. required verbal, and tactile cues for theraband exercises.Pt.continues to require consistent verbal, visual, and tactile cues.Pt.continues torequire cues for radial deviation. Pt. continues to require fewer cues, and less assist to maintain upright midline sitting while working out on the UBE. No assist was needed to maintain upright midline sitting. Pt.continues torequire less cuesto perform UE strengthening exercises, technique, and formas well as visual demonstration prior to the exercises.Pt. continues to work on improving BUE strength, and [facility] skills in order to improve LUE functioning during ADLs, IADLs, and tranfers.   Occupational performance deficits (Please refer to evaluation for details): ADL's    Body Structure / Function / Physical Skills UE functional use;Coordination;FMC;Dexterity;Strength;ROM    Cognitive Skills Attention;Memory;Emotional;Problem Solve;Safety Awareness    Rehab Potential Good    Clinical Decision Making Several treatment options, min-mod task modification necessary    Comorbidities Affecting Occupational Performance: Presence of comorbidities impacting occupational performance    Comorbidities impacting occupational performance description: Phyical, cognitive, visual,  medical comorbidities    Modification or Assistance to Complete Evaluation  No modification of tasks or assist necessary to complete eval    OT Frequency 2x / week    OT Duration 12 weeks    OT Treatment/Interventions Self-care/ADL training;DME and/or AE instruction;Therapeutic exercise;Therapeutic activities;Moist Heat;Cognitive  remediation/compensation;Neuromuscular education;Visual/perceptual remediation/compensation;Coping strategies training;Patient/family education;Passive range of motion;Psychosocial skills training;Energy conservation;Functional Mobility Training    Consulted and Agree with Plan of Care Patient;Family member/caregiver    Family Member Consulted Caregiver Ch[place]           Patient will benefit from skilled therapeutic intervention in order to improve the following deficits and impairments:   Body Structure / Function / Physical Skills: UE functional use,Coordination,FMC,Dexterity,Strength,ROM Cognitive Skills: Attention,Memory,Emotional,Problem Solve,Safety Awareness     Visit Diagnosis: Muscle weakness (generalized)    Problem List Patient Active Problem List   Diagnosis Date Noted  . Sepsis ([place]) 10/18/2018  . Long term (current) use of anticoagulants 05/01/2018  . History of stroke 04/11/2018  . Hyperlipidemia 07/04/2017  . Chronic diarrhea 06/12/2017  . 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 Fatigue 02/06/2015  . Visual changes 06/19/2013  . Benign prostatic hyperplasia 01/07/2013  . Depression 01/07/2013  . Restless legs syndrome 01/07/2013  . Chronic kidney disease, stage 3 unspecified ([place]) 12/20/2011  . CAD (coronary artery disease) 11/03/2010  . Hypertension 11/03/2010  . PAD (peripheral artery disease) ([place]) 11/03/2010  . SLE (systemic lupus erythematosus) ([place]) 11/03/2010    Harrel Carina, MS, OTR/L 06/09/2020, 3:25 PM  [place] MAIN [facility] SERVICES [address] [place], [place], [address] Phone: [phone]   Fax:  [phone]  Name: Kenneth Summers MRN: [id number] Date of Birth: 09-30-1945

## 2020-06-09 NOTE — Therapy (Signed)
[place] MAIN [facility] SERVICES [address] [place], [place], [address] Phone: [phone]   Fax:  [phone]  Physical Therapy Treatment  Patient Details  Name: Kenneth Summers MRN: [id number] Date of Birth: Dec 02, 1945 No data recorded  Encounter Date: 06/09/2020   PT End of Session - 06/09/20 1339    Visit Number 241    Number of Visits 276    Date for PT Re-Evaluation 07/12/20    Authorization Type Medicare reporting period starting 09/11/18; NO FOTO    Authorization Time Period Recert 05/25/6220-10/14/9890    PT Start Time 1304    PT Stop Time 1344    PT Time Calculation (min) 40 min    Equipment Utilized During Treatment Gait belt    Activity Tolerance No increased pain;Patient limited by fatigue;Patient tolerated treatment well    Behavior During Therapy [facility] for tasks assessed/performed           Past Medical History:  Diagnosis Date  . Actinic keratosis   . Alcohol abuse   . APS (antiphospholipid syndrome) ([place])   . Basal cell carcinoma   . BPH (benign prostatic hyperplasia)   . CAD (coronary artery disease)   . Cellulitis   . Cervical spinal stenosis   . Chronic osteomyelitis ([place])   . CKD (chronic kidney disease)   . CKD (chronic kidney disease)   . Clostridium difficile diarrhea   . Depression   . Diplopia   . Fatigue   . GERD (gastroesophageal reflux disease)   . Hyperlipemia   . Hypertension   . Hypovitaminosis D   . IBS (irritable bowel syndrome)   . 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 Insomnia   . Left lumbosacral radiculopathy   . Moderate obstructive sleep apnea   . Osteomyelitis of foot ([place])   . PAD (peripheral artery disease) ([place])   . Pruritus   . RA (rheumatoid arthritis) ([place])   . Radiculopathy   . Restless legs syndrome   . RLS (restless legs syndrome)   . SLE (systemic lupus erythematosus related syndrome) ([place])   . Squamous cell carcinoma of skin 01/27/2019   right mid lower ear helix  . Stroke ([place])    . Toxic maculopathy of both eyes     Past Surgical History:  Procedure Laterality Date  . CARDIAC CATHETERIZATION    . CAROTID ENDARTERECTOMY    . CERVICAL LAMINECTOMY    . CORONARY ANGIOPLASTY    . FRACTURE SURGERY    . fusion C5-6-7    . HERNIA REPAIR    . KNEE ARTHROSCOPY    . TONSILLECTOMY      There were no vitals filed for this visit.   Subjective Assessment - 06/09/20 1337    Subjective Patient reports  feeling pretty good today and ready to work.    Patient is accompained by: Family member    Pertinent History Patient is a 75 y.o. male who presents to outpatient physical therapy with a referral for medical diagnosis of CVA. This patient's chief complaints consist of left hemiplegia and overall deconditioning leading to the following functional deficits: dependent for ADLs, IADLs, unable to transfer to car, dependent transfers at home with hoyer lift, unable to walk or stand without significant assistance, difficulty with W/C navigation..    Limitations Sitting;Lifting;Standing;House hold activities;Writing;Walking;Other (comment)    Diagnostic tests Brain MRI 11/01/2017: IMPRESSION: 1. Acute right ACA territory infarct as demonstrated on prior CT imaging. No intracranial hemorrhage or significant mass effect. 2. Age  advanced global brain atrophy and small chronic right occipital Infarct.    Patient Stated Goals wants to be able to walk again    Currently in Pain? No/denies    Pain Onset Today    Pain Onset Today           Interventions:   Transfer training: powerchair to mat- Patient required min- mod assist to scoot forward and max VCs for hand placement and to lean trunk forward. Patient required mod A x 2 to stand using SW today from lift chair.   Static standing using walker with max assist of 2 persons- 1 on each side- 5 trials 1) 30 sec 2) 38 sec 3) 58 sec. 4) 41min sec 5) 28 sec - Patient presented with initial difficulty achieving erect standing- requiring max  assist with trunk/quad extension.  He also demo difficulty placing weight through walker and was pulling at times rather than pushing down on handles. He was able to extend his right knee with verbal cues yet unable to extend left knee and trunk.   Clinical Impression: Patient was able to improve confidence and able to stand using walker as previously unable.  He did improve today with increased trials yet continued difficulty with erect standing requiring max Assistance throughout treatment.  He will continue to benefit from skilled PT services to improve his functional strength and postural control for improved transfers/standing endurance                                               PT Education - 06/09/20 1339    Education Details Standing postural cues    Person(s) Educated Patient    Methods Explanation;Demonstration;Tactile cues;Verbal cues    Comprehension Verbalized understanding;Returned demonstration;Verbal cues required;Tactile cues required            PT Short Term Goals - 04/19/20 2317      PT SHORT TERM GOAL #1   Title Be independent with initial home exercise program for self-management of symptoms.    Baseline HEP to be given at second session; advice to practice unsupported sitting at home (06/19/2018); 07/08/18: Performing at home, 6/29: needs assistance but is doing exercise program regularly; 08/22/18: adherent;    Time 2    Period Weeks    Status Achieved    Target Date 06/26/18      PT SHORT TERM GOAL #2   Title Patient will perform sit to stand transfer with mod Assist in parallel bars for improved transfer ability with decreased caregiver assist.    Baseline 04/19/2020- Max assist of 1 person    Time 6    Period Weeks    Status New    Target Date 05/31/20             PT Long Term Goals - 04/19/20 1321      PT LONG TERM GOAL #1   Title Patient will complete all bed mobility with min A to improve functional  independence for getting in and out of bed and adjusting in bed.     Baseline max A - total A reported by family (06/19/2018); 07/08/18: still requires maxA, dtr reports improvement in rolling since starting therapy; 6/23: min A to supervision in clinic; not doing at home right; 8/5: Pt's daughter states that his rolling has improved, but still has difficulty with all other bed mobility; 10/23/18:  pts daughter states that she is doing about 35% of the work if he is in proper position, he does uses the bed rails to help roll;  using Harrel Lemon for coming to sit EOB; 10/12: pts daughter states that she is doing about 30% of the work if he is in proper position, he does uses the bed rails to help roll, but it is improving;  using Harrel Lemon for coming to sit EOB, 10/29: min A for sit to sidelying, rolling supervision, able to transition sidelying to sit with HHA; 12/21: min-mod A. 03/01/2020- Patient presents improving bed mobility with current ability to roll to left with CGA and verbal and tactile cues and min assist to roll to right as seen by recent visits. 03/31/2020- Patient and caregiver report that he has maintained ability to roll side to side with only minimal assistance.    Time 12    Period Weeks    Status Achieved      PT LONG TERM GOAL #2   Title Patient will complete sit <> stand transfer chair to chair with Min A and LRAD to improve functional independence for household and community mobility.     Baseline min A for STS, still requires elevated surface height to 27".  03/01/20- Patient currently unable to perform a chair to chair transfer and continues to be transferred using hoyer lift. Patient has been training with sit to stand at parallel bars and able to currently perform sit to stand with max assist  from 27in. high surface.03/31/2020- Patient remains at [facility] assist with sit to stand transfer and continued use of hoyer lift transfer for all chair to chair or bed to chair transfers. 04/19/2020- Patient  requires Max assist with sit to stand transfers and requires hoyer for chair to chair transfers.    Time 12    Period Weeks    Status On-going    Target Date 07/12/20      PT LONG TERM GOAL #3   Title Patient will navigate power w/c with min A x 100 feet to improve mobility for household and short community distances.    Baseline required total A (06/12/2018); able to roll 20 feet with min A (06/20/2018); 07/08/18: Pt can go approximately 10' without assist per family;  10/23/2018: pt's daughter states that his power WC mobility has become 100% better with the new hand control, slowed speed, and with practice; 10/12: pt's daughter reports that he can perform household WC mobility and limited community ambulation with minA    Time 12    Period Weeks    Status Achieved      PT LONG TERM GOAL #4   Title Patient will complete car transfers W/C to family SUV with min A using LRAD to improve his abilty to participate in community activities.     Baseline unable to complete car transfers at all (06/12/2018); unable to complete car transfers at all (06/19/2018).; 07/08/18: Unable to perform at this time; 09/11/18: unable to perform at this time; 10/23/2018: unable to complete car transfers at all; 10/12:  unable to complete car transfers at all, 10/29: unable; 01/13/19: unable; 2/11: min-mod A +2, 3/10: min A+1, 12/21: mod A. 03/01/2020- Patient unable to complete car transfers and continues to require medical transportation at this time. 3/14- Patient reports he may be purchasing a conversion van later this week.    Time 12    Period Weeks    Status Deferred      PT LONG TERM GOAL #5  Title Patient will ambulate at least 10 feet with min A using LRAD to improve mobility for household and community distances.    Baseline unable to take steps (06/12/2018); able to shift R leg forward and back with max A and RW at edge of plinth (06/19/2018); 07/08/18: unable to ambulate at this time. 09/11/18: unable to perform at this  time; 10/23/2018: unable to perform at this time; 10/12: unable to perform at this time, 10/29: unable at this time; 01/13/19: unable at this time, 2/11: min A to step with RLE, unable to step with LLE, 3/10: able to take 2 steps in parallel bars with min A for safety; 4/1: min A for walking 10 feet in parallel bars, 5/24: min A walking 10 feet in parallel bars; 6/21: no change 7/14: no change. 03/01/2020- Patient unable to ambulate and currently focusing on static standing. He was able to take a forward step with his right LE last week but unable today. 03/31/2020- Patient continues to be non-ambulatory but has been able to move right LE forward with standing but unable to take a step with left LE. 04/19/2020- Goal is still not appropriate as of yet- cont to focus on static and dynamic standing balance/pregait activities.    Time 12    Period Weeks    Status Deferred    Target Date 07/12/20      PT LONG TERM GOAL #6   Title Patient will be able to safety navigate up/down ramp with power chair, exhibiting good safety awareness, mod I to safely enter/exit his home.    Baseline 2/11: supervision with intermittent min A;4/1: supervision; 5/24: supervision. 03/01/2020- patient reports that he is able to navigate down the ramp at home to leave the home but continues to need assist to navigate up the ramp into the home.    Time 12    Period Weeks    Status Partially Met      PT LONG TERM GOAL #7   Title Patient will increase functional reach test to >15 inches in sitting to exhibit improved sitting balance and positioning and reduce fall risk;    Baseline 07/30/18: 12 inches; 09/11/18: 10-12 inches; 10/23/18: 12-13 inch; 10/12: 14.5", 10/29: 15.5 inch    Time 4    Period Weeks    Status Achieved      PT LONG TERM GOAL #8   Title Patient will increase LLE quad strength to 3+/5 to improve functional strength for standing and mobility;    Baseline 10/28: 2+/5; 01/13/19: 2+/5, 2/11: 3-/5, 04/16/19: 3-/5, 05/08/19:  3-/5, 5/24: 3-/5, 6/21: 3-/5, 7/14: 3-/5, 12/21: 3-/5. 03/01/2020- Left quad= 3-/5 (focusing on quad strengthening during visits and with home program). 03/31/2020- left quad = 3-/5 with MMT. 04/19/2020= 3- /5 with left quad strength with manual muscle testing.    Time 12    Period Weeks    Status On-going    Target Date 07/12/20      PT LONG TERM GOAL  #9   TITLE Patient will report no vertigo with provoking motions or positions in order to be able to perform ADLs and mobility tasks without symptoms.    Baseline 06/30/19: reports no dizziness in last week; 6/21: one episode this weekend; 7/14: experiencing one a week; 03/01/2020.- Patient has denied any dizziness for over past 3 weeks.    Time 12    Period Weeks    Status Achieved      PT LONG TERM GOAL  #10   TITLE Patient  will demonstrate improved functional LE/Postural strength as seen by ability to perform static standing for 2 min or greater with max assist  to assist with strength required for ADL's and transfers/pregait/gait activities.    Baseline 03/01/2020- Patient able to stand approx 40 sec today with max assist in parallel bars. 03/31/2020- Patient able to demo static stand with max A for 68min 47 sec today. 04/19/2020- Patient able to demo 2 min today with mod/max assist - yet remains inconsistent.    Time 8    Period Weeks    Status Partially Met    Target Date 07/12/20                 Plan - 06/09/20 1503    Clinical Impression Statement Patient was able to improve confidence and able to stand using walker as previously unable.  He did improve today with increased trials yet continued difficulty with erect standing requiring max Assistance throughout treatment.  He will continue to benefit from skilled PT services to improve his functional strength and postural control for improved transfers/standing endurance    Personal Factors and Comorbidities Age;Comorbidity 3+    Comorbidities Relevant past medical history and  comorbidities include long term steroid use for lupus, CKD, chronic osteomyelitis, cervical spine stenosis, BPH, APS, alcohol abuse, peripheral artery disease, depression, GERD, obstructive sleep apnea, HTN, IBS, IgA deficiency, lumbar radiculopathy, RA, Stroke, toxic maculopathy in both eyes, systemic lupus erythematosus related syndrome, cardiac catheterization, carotid endarterectomy, cervical laminectomy, coronary angioplasty, Fusion C5-C7, knee arthroplasty, lumbar surgery.    Examination-Activity Limitations Bed Mobility;Bend;Sit;Toileting;Stand;Stairs;Lift;Transfers;Squat;Locomotion Level;Carry;Dressing;Hygiene/Grooming;Continence    Examination-Participation Restrictions Yard Work;Interpersonal Relationship;Community Activity    Stability/Clinical Decision Making Evolving/Moderate complexity    Rehab Potential Fair    PT Frequency 2x / week    PT Duration 12 weeks    PT Treatment/Interventions ADLs/Self Care Home Management;Electrical Stimulation;Moist Heat;Cryotherapy;Gait training;Stair training;Functional mobility training;Therapeutic exercise;Balance training;Neuromuscular re-education;Cognitive remediation;Patient/family education;Orthotic Fit/Training;Wheelchair mobility training;Manual techniques;Passive range of motion;Energy conservation;Joint Manipulations;Canalith Repostioning;Therapeutic activities;Vestibular    PT Next Visit Plan Continue with progressive postural/LE strengthening/pregait activities    PT Home Exercise Plan no changes    Consulted and Agree with Plan of Care Patient;Family member/caregiver           Patient will benefit from skilled therapeutic intervention in order to improve the following deficits and impairments:  Abnormal gait,Decreased activity tolerance,Decreased cognition,Decreased endurance,Decreased knowledge of use of DME,Decreased range of motion,Decreased skin integrity,Decreased strength,Impaired perceived functional ability,Impaired  sensation,Impaired UE functional use,Improper body mechanics,Pain,Cardiopulmonary status limiting activity,Decreased balance,Decreased coordination,Decreased mobility,Difficulty walking,Impaired tone,Postural dysfunction,Dizziness  Visit Diagnosis: Abnormality of gait and mobility  Difficulty in walking, not elsewhere classified  Muscle weakness (generalized)  Other lack of coordination     Problem List Patient Active Problem List   Diagnosis Date Noted  . Sepsis ([place]) 10/18/2018  . Long term (current) use of anticoagulants 05/01/2018  . History of stroke 04/11/2018  . Hyperlipidemia 07/04/2017  . Chronic diarrhea 06/12/2017  . 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 Fatigue 02/06/2015  . Visual changes 06/19/2013  . Benign prostatic hyperplasia 01/07/2013  . Depression 01/07/2013  . Restless legs syndrome 01/07/2013  . Chronic kidney disease, stage 3 unspecified ([place]) 12/20/2011  . CAD (coronary artery disease) 11/03/2010  . Hypertension 11/03/2010  . PAD (peripheral artery disease) ([place]) 11/03/2010  . SLE (systemic lupus erythematosus) ([place]) 11/03/2010    Lewis Moccasin, PT 06/09/2020, 3:12 PM  [place]  [place] [facility] SERVICES [address] [place], [place], [address] Phone: [phone]   Fax:  [phone]  Name: Kenneth Summers MRN: [id number] Date of Birth: 14-Aug-1945

## 2020-06-10 ENCOUNTER — Ambulatory Visit: Payer: Medicare Other

## 2020-06-10 ENCOUNTER — Encounter: Payer: Medicare Other | Admitting: Occupational Therapy

## 2020-06-14 ENCOUNTER — Other Ambulatory Visit: Payer: Self-pay

## 2020-06-14 ENCOUNTER — Ambulatory Visit: Payer: Medicare Other

## 2020-06-14 ENCOUNTER — Encounter: Payer: Self-pay | Admitting: Occupational Therapy

## 2020-06-14 ENCOUNTER — Ambulatory Visit: Payer: Medicare Other | Admitting: Occupational Therapy

## 2020-06-14 DIAGNOSIS — R278 Other lack of coordination: Secondary | ICD-10-CM

## 2020-06-14 DIAGNOSIS — R262 Difficulty in walking, not elsewhere classified: Secondary | ICD-10-CM

## 2020-06-14 DIAGNOSIS — M6281 Muscle weakness (generalized): Secondary | ICD-10-CM

## 2020-06-14 DIAGNOSIS — R269 Unspecified abnormalities of gait and mobility: Secondary | ICD-10-CM | POA: Diagnosis not present

## 2020-06-14 NOTE — Therapy (Signed)
[place] MAIN [facility] SERVICES [address] [place], [place], [address] Phone: [phone]   Fax:  [phone]  Physical Therapy Treatment  Patient Details  Name: Kenneth Summers MRN: [id number] Date of Birth: 1945-06-10 No data recorded  Encounter Date: 06/14/2020   PT End of Session - 06/14/20 1312    Visit Number [id number]    Number of Visits 276    Date for PT Re-Evaluation 07/12/20    Authorization Type Medicare reporting period starting 09/11/18; NO FOTO    Authorization Time Period Recert 5/95/6387-06/12/4330    PT Start Time 1300    PT Stop Time 1342    PT Time Calculation (min) 42 min    Equipment Utilized During Treatment Gait belt    Activity Tolerance No increased pain;Patient limited by fatigue;Patient tolerated treatment well    Behavior During Therapy [facility] for tasks assessed/performed           Past Medical History:  Diagnosis Date  . Actinic keratosis   . Alcohol abuse   . APS (antiphospholipid syndrome) ([place])   . Basal cell carcinoma   . BPH (benign prostatic hyperplasia)   . CAD (coronary artery disease)   . Cellulitis   . Cervical spinal stenosis   . Chronic osteomyelitis ([place])   . CKD (chronic kidney disease)   . CKD (chronic kidney disease)   . Clostridium difficile diarrhea   . Depression   . Diplopia   . Fatigue   . GERD (gastroesophageal reflux disease)   . Hyperlipemia   . Hypertension   . Hypovitaminosis D   . IBS (irritable bowel syndrome)   . 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 Insomnia   . Left lumbosacral radiculopathy   . Moderate obstructive sleep apnea   . Osteomyelitis of foot ([place])   . PAD (peripheral artery disease) ([place])   . Pruritus   . RA (rheumatoid arthritis) ([place])   . Radiculopathy   . Restless legs syndrome   . RLS (restless legs syndrome)   . SLE (systemic lupus erythematosus related syndrome) ([place])   . Squamous cell carcinoma of skin 01/27/2019   right mid lower ear helix  . Stroke ([place])    . Toxic maculopathy of both eyes     Past Surgical History:  Procedure Laterality Date  . CARDIAC CATHETERIZATION    . CAROTID ENDARTERECTOMY    . CERVICAL LAMINECTOMY    . CORONARY ANGIOPLASTY    . FRACTURE SURGERY    . fusion C5-6-7    . HERNIA REPAIR    . KNEE ARTHROSCOPY    . TONSILLECTOMY      There were no vitals filed for this visit.   Subjective Assessment - 06/14/20 1306    Subjective Patient reports that he is changing his primary MD later this week.    Patient is accompained by: Family member    Pertinent History Patient is a 75 y.o. male who presents to outpatient physical therapy with a referral for medical diagnosis of CVA. This patient's chief complaints consist of left hemiplegia and overall deconditioning leading to the following functional deficits: dependent for ADLs, IADLs, unable to transfer to car, dependent transfers at home with hoyer lift, unable to walk or stand without significant assistance, difficulty with W/C navigation..    Limitations Sitting;Lifting;Standing;House hold activities;Writing;Walking;Other (comment)    Diagnostic tests Brain MRI 11/01/2017: IMPRESSION: 1. Acute right ACA territory infarct as demonstrated on prior CT imaging. No intracranial hemorrhage or significant mass effect. 2.  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    Patient Stated Goals wants to be able to walk again    Currently in Pain? No/denies    Pain Onset Today    Pain Onset Today            Interventions:  Seated hip march AAROM on Left LE and AROM on right LE 2 sets of 20 reps Seated knee ext -AAROM on LLE and AROM on right 2 sets of 20 reps.  Seated Forward trunk lean- Assisted (min) and patient exhibiting listing toward left side today and difficulty with initiating forward lean. Seated Trunk rotation -PROM toward right side  initially since patient was exhibiting listing posture to left today. 8 min total time with ROM  activity.  ALT BUE reaching across body at varying angles focusing on leaning forward/upright - Touching PT hands upon command. UE shoulder flex/Trunk ext- Patient holding onto small PVC pipe and instructed to raise overhead- Focusing more on postural control with improved symmetry and no listing to left side x 15 reps.  Sit to Stand x 5 from seat of power chair with max A with cues to look forward - patient with difficulty with forward trunk lean and increased VC for hand placement  Static sitting working on erect posture while performing dynamic UE activity- reaching for cone then reaching across opp. Side and then same on opp side x 5 cones each x 2 trials. Patient struggled to coordinate activity and mod difficulty maintaining trunk upright- falling back into chair several times.  Ended session with forward leaning- reaching for cones located on tray table and attempting to stack them - patient performed 5 with right UE without significant difficulty yet  Performed 5 more on left side exhibiting increased difficulty coordinating movement with Left UE.   Education provided throughout session via VC/TC and demonstration to facilitate movement at target joints and correct muscle activation for all testing and exercises performed.    Clinical Impression: Patient challenged today with seated postural activities exhibiting difficulty listing to left side upon arrival and throughout visit. He did improve with practice yet increased overall difficulty maintaining erect seated posture throughout all activities today. He will continue to benefit from skilled PT services to improve his functional strength and postural control for improved transfers/standing endurance                        PT Education - 06/14/20 1310    Education Details Standing postural cues            PT Short Term Goals - 04/19/20 2317      PT SHORT TERM GOAL #1   Title Be independent with initial home  exercise program for self-management of symptoms.    Baseline HEP to be given at second session; advice to practice unsupported sitting at home (06/19/2018); 07/08/18: Performing at home, 6/29: needs assistance but is doing exercise program regularly; 08/22/18: adherent;    Time 2    Period Weeks    Status Achieved    Target Date 06/26/18      PT SHORT TERM GOAL #2   Title Patient will perform sit to stand transfer with mod Assist in parallel bars for improved transfer ability with decreased caregiver assist.    Baseline 04/19/2020- Max assist of 1 person    Time 6    Period Weeks    Status New    Target Date 05/31/20  PT Long Term Goals - 04/19/20 1321      PT LONG TERM GOAL #1   Title Patient will complete all bed mobility with min A to improve functional independence for getting in and out of bed and adjusting in bed.     Baseline max A - total A reported by family (06/19/2018); 07/08/18: still requires maxA, dtr reports improvement in rolling since starting therapy; 6/23: min A to supervision in clinic; not doing at home right; 8/5: Pt's daughter states that his rolling has improved, but still has difficulty with all other bed mobility; 10/23/18: pts daughter states that she is doing about 35% of the work if he is in proper position, he does uses the bed rails to help roll;  using Harrel Lemon for coming to sit EOB; 10/12: pts daughter states that she is doing about 30% of the work if he is in proper position, he does uses the bed rails to help roll, but it is improving;  using Harrel Lemon for coming to sit EOB, 10/29: min A for sit to sidelying, rolling supervision, able to transition sidelying to sit with HHA; 12/21: min-mod A. 03/01/2020- Patient presents improving bed mobility with current ability to roll to left with CGA and verbal and tactile cues and min assist to roll to right as seen by recent visits. 03/31/2020- Patient and caregiver report that he has maintained ability to roll side to  side with only minimal assistance.    Time 12    Period Weeks    Status Achieved      PT LONG TERM GOAL #2   Title Patient will complete sit <> stand transfer chair to chair with Min A and LRAD to improve functional independence for household and community mobility.     Baseline min A for STS, still requires elevated surface height to 27".  03/01/20- Patient currently unable to perform a chair to chair transfer and continues to be transferred using hoyer lift. Patient has been training with sit to stand at parallel bars and able to currently perform sit to stand with max assist  from 27in. high surface.03/31/2020- Patient remains at [facility] assist with sit to stand transfer and continued use of hoyer lift transfer for all chair to chair or bed to chair transfers. 04/19/2020- Patient requires Max assist with sit to stand transfers and requires hoyer for chair to chair transfers.    Time 12    Period Weeks    Status On-going    Target Date 07/12/20      PT LONG TERM GOAL #3   Title Patient will navigate power w/c with min A x 100 feet to improve mobility for household and short community distances.    Baseline required total A (06/12/2018); able to roll 20 feet with min A (06/20/2018); 07/08/18: Pt can go approximately 10' without assist per family;  10/23/2018: pt's daughter states that his power WC mobility has become 100% better with the new hand control, slowed speed, and with practice; 10/12: pt's daughter reports that he can perform household WC mobility and limited community ambulation with minA    Time 12    Period Weeks    Status Achieved      PT LONG TERM GOAL #4   Title Patient will complete car transfers W/C to family SUV with min A using LRAD to improve his abilty to participate in community activities.     Baseline unable to complete car transfers at all (06/12/2018); unable to complete car transfers at  all (06/19/2018).; 07/08/18: Unable to perform at this time; 09/11/18: unable to perform at this  time; 10/23/2018: unable to complete car transfers at all; 10/12:  unable to complete car transfers at all, 10/29: unable; 01/13/19: unable; 2/11: min-mod A +2, 3/10: min A+1, 12/21: mod A. 03/01/2020- Patient unable to complete car transfers and continues to require medical transportation at this time. 3/14- Patient reports he may be purchasing a conversion van later this week.    Time 12    Period Weeks    Status Deferred      PT LONG TERM GOAL #5   Title Patient will ambulate at least 10 feet with min A using LRAD to improve mobility for household and community distances.    Baseline unable to take steps (06/12/2018); able to shift R leg forward and back with max A and RW at edge of plinth (06/19/2018); 07/08/18: unable to ambulate at this time. 09/11/18: unable to perform at this time; 10/23/2018: unable to perform at this time; 10/12: unable to perform at this time, 10/29: unable at this time; 01/13/19: unable at this time, 2/11: min A to step with RLE, unable to step with LLE, 3/10: able to take 2 steps in parallel bars with min A for safety; 4/1: min A for walking 10 feet in parallel bars, 5/24: min A walking 10 feet in parallel bars; 6/21: no change 7/14: no change. 03/01/2020- Patient unable to ambulate and currently focusing on static standing. He was able to take a forward step with his right LE last week but unable today. 03/31/2020- Patient continues to be non-ambulatory but has been able to move right LE forward with standing but unable to take a step with left LE. 04/19/2020- Goal is still not appropriate as of yet- cont to focus on static and dynamic standing balance/pregait activities.    Time 12    Period Weeks    Status Deferred    Target Date 07/12/20      PT LONG TERM GOAL #6   Title Patient will be able to safety navigate up/down ramp with power chair, exhibiting good safety awareness, mod I to safely enter/exit his home.    Baseline 2/11: supervision with intermittent min A;4/1: supervision;  5/24: supervision. 03/01/2020- patient reports that he is able to navigate down the ramp at home to leave the home but continues to need assist to navigate up the ramp into the home.    Time 12    Period Weeks    Status Partially Met      PT LONG TERM GOAL #7   Title Patient will increase functional reach test to >15 inches in sitting to exhibit improved sitting balance and positioning and reduce fall risk;    Baseline 07/30/18: 12 inches; 09/11/18: 10-12 inches; 10/23/18: 12-13 inch; 10/12: 14.5", 10/29: 15.5 inch    Time 4    Period Weeks    Status Achieved      PT LONG TERM GOAL #8   Title Patient will increase LLE quad strength to 3+/5 to improve functional strength for standing and mobility;    Baseline 10/28: 2+/5; 01/13/19: 2+/5, 2/11: 3-/5, 04/16/19: 3-/5, 05/08/19: 3-/5, 5/24: 3-/5, 6/21: 3-/5, 7/14: 3-/5, 12/21: 3-/5. 03/01/2020- Left quad= 3-/5 (focusing on quad strengthening during visits and with home program). 03/31/2020- left quad = 3-/5 with MMT. 04/19/2020= 3- /5 with left quad strength with manual muscle testing.    Time 12    Period Weeks    Status On-going  Target Date 07/12/20      PT LONG TERM GOAL  #9   TITLE Patient will report no vertigo with provoking motions or positions in order to be able to perform ADLs and mobility tasks without symptoms.    Baseline 06/30/19: reports no dizziness in last week; 6/21: one episode this weekend; 7/14: experiencing one a week; 03/01/2020.- Patient has denied any dizziness for over past 3 weeks.    Time 12    Period Weeks    Status Achieved      PT LONG TERM GOAL  #10   TITLE Patient will demonstrate improved functional LE/Postural strength as seen by ability to perform static standing for 2 min or greater with max assist  to assist with strength required for ADL's and transfers/pregait/gait activities.    Baseline 03/01/2020- Patient able to stand approx 40 sec today with max assist in parallel bars. 03/31/2020- Patient able to demo  static stand with max A for 4mn 47 sec today. 04/19/2020- Patient able to demo 2 min today with mod/max assist - yet remains inconsistent.    Time 8    Period Weeks    Status Partially Met    Target Date 07/12/20                 Plan - 06/14/20 1312    Clinical Impression Statement Patient challenged today with seated postural activities exhibiting difficulty listing to left side upon arrival and throughout visit. He did improve with practice yet increased overall difficulty maintaining erect seated posture throughout all activities today. He will continue to benefit from skilled PT services to improve his functional strength and postural control for improved transfers/standing endurance    Personal Factors and Comorbidities Age;Comorbidity 3+    Comorbidities Relevant past medical history and comorbidities include long term steroid use for lupus, CKD, chronic osteomyelitis, cervical spine stenosis, BPH, APS, alcohol abuse, peripheral artery disease, depression, GERD, obstructive sleep apnea, HTN, IBS, IgA deficiency, lumbar radiculopathy, RA, Stroke, toxic maculopathy in both eyes, systemic lupus erythematosus related syndrome, cardiac catheterization, carotid endarterectomy, cervical laminectomy, coronary angioplasty, Fusion C5-C7, knee arthroplasty, lumbar surgery.    Examination-Activity Limitations Bed Mobility;Bend;Sit;Toileting;Stand;Stairs;Lift;Transfers;Squat;Locomotion Level;Carry;Dressing;Hygiene/Grooming;Continence    Examination-Participation Restrictions Yard Work;Interpersonal Relationship;Community Activity    Stability/Clinical Decision Making Evolving/Moderate complexity    Rehab Potential Fair    PT Frequency 2x / week    PT Duration 12 weeks    PT Treatment/Interventions ADLs/Self Care Home Management;Electrical Stimulation;Moist Heat;Cryotherapy;Gait training;Stair training;Functional mobility training;Therapeutic exercise;Balance training;Neuromuscular  re-education;Cognitive remediation;Patient/family education;Orthotic Fit/Training;Wheelchair mobility training;Manual techniques;Passive range of motion;Energy conservation;Joint Manipulations;Canalith Repostioning;Therapeutic activities;Vestibular    PT Next Visit Plan Continue with progressive postural/LE strengthening/pregait activities    PT Home Exercise Plan no changes    Consulted and Agree with Plan of Care Patient;Family member/caregiver           Patient will benefit from skilled therapeutic intervention in order to improve the following deficits and impairments:  Abnormal gait,Decreased activity tolerance,Decreased cognition,Decreased endurance,Decreased knowledge of use of DME,Decreased range of motion,Decreased skin integrity,Decreased strength,Impaired perceived functional ability,Impaired sensation,Impaired UE functional use,Improper body mechanics,Pain,Cardiopulmonary status limiting activity,Decreased balance,Decreased coordination,Decreased mobility,Difficulty walking,Impaired tone,Postural dysfunction,Dizziness  Visit Diagnosis: Abnormality of gait and mobility  Difficulty in walking, not elsewhere classified  Muscle weakness (generalized)  Other lack of coordination     Problem List Patient Active Problem List   Diagnosis Date Noted  . Sepsis ([place] 10/18/2018  . Long term (current) use of anticoagulants 05/01/2018  . History of stroke 04/11/2018  .  Hyperlipidemia 07/04/2017  . Chronic diarrhea 06/12/2017  . 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) 04/27/2016  . 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 Fatigue 02/06/2015  . Visual changes 06/19/2013  . Benign prostatic hyperplasia 01/07/2013  . Depression 01/07/2013  . Restless legs syndrome 01/07/2013  . Chronic kidney disease, stage 3 unspecified ([place]) 12/20/2011  . CAD (coronary artery disease) 11/03/2010  . Hypertension 11/03/2010  . PAD (peripheral artery  disease) ([place]) 11/03/2010  . SLE (systemic lupus erythematosus) ([place]) 11/03/2010    Lewis Moccasin, PT 06/14/2020, 2:13 PM  [place] MAIN [facility] SERVICES [address] [place], [place], [address] Phone: [phone]   Fax:  [phone]  Name: Kenneth Summers MRN: [id number] Date of Birth: 25-Jul-1945

## 2020-06-14 NOTE — Therapy (Signed)
Kenneth Summers MAIN [facility] SERVICES [address] [place], [place], [address] Phone: [phone]   Fax:  [phone]  Occupational Therapy Treatment  Patient Details  Name: Kenneth Summers MRN: [id number] Date of Birth: 18-Nov-1945 No data recorded  Encounter Date: 06/14/2020   OT End of Session - 06/14/20 1410    Visit Number [id number]    Number of Visits 254    Date for OT Re-Evaluation 08/16/20    Authorization Type Progress report period starting 05/31/2020    OT Start Time 1350    OT Stop Time 1430    OT Time Calculation (min) 40 min    Equipment Utilized During Treatment pegboard and patterns    Activity Tolerance Patient tolerated treatment well    Behavior During Therapy WFL for tasks assessed/performed           Past Medical History:  Diagnosis Date  . Actinic keratosis   . Alcohol abuse   . APS (antiphospholipid syndrome) ([place])   . Basal cell carcinoma   . BPH (benign prostatic hyperplasia)   . CAD (coronary artery disease)   . Cellulitis   . Cervical spinal stenosis   . Chronic osteomyelitis ([place])   . CKD (chronic kidney disease)   . CKD (chronic kidney disease)   . Clostridium difficile diarrhea   . Depression   . Diplopia   . Fatigue   . GERD (gastroesophageal reflux disease)   . Hyperlipemia   . Hypertension   . Hypovitaminosis D   . IBS (irritable bowel syndrome)   . 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 Insomnia   . Left lumbosacral radiculopathy   . Moderate obstructive sleep apnea   . Osteomyelitis of foot ([place])   . PAD (peripheral artery disease) ([place])   . Pruritus   . RA (rheumatoid arthritis) ([place])   . Radiculopathy   . Restless legs syndrome   . RLS (restless legs syndrome)   . SLE (systemic lupus erythematosus related syndrome) ([place])   . Squamous cell carcinoma of skin 01/27/2019   right mid lower ear helix  . Stroke ([place])   . Toxic maculopathy of both eyes     Past Surgical History:  Procedure Laterality Date   . CARDIAC CATHETERIZATION    . CAROTID ENDARTERECTOMY    . CERVICAL LAMINECTOMY    . CORONARY ANGIOPLASTY    . FRACTURE SURGERY    . fusion C5-6-7    . HERNIA REPAIR    . KNEE ARTHROSCOPY    . TONSILLECTOMY      There were no vitals filed for this visit.    OT TREATMENT   Therapeutic Exercise:  Pt.worked on BUE strengthening, and reciprocal motion using the UBE while seated for 8 min. with no resistance. Constant monitoring was provided.A towel roll was placed at the left lateral trunk secondary to left lateral leaning.Pt. performed2.5#dowelfor UE strengthening secondary to weakness.Pt.tolerated increased dowel weight.Bilateral shoulder flexion, chest press,andcircular patterns,were performedfor1-2 sets10-20reps eachwith cues to keep the dowel symmetrical, and level.Pt. worked on4# dumbbell ex. forrightelbow flexion and extension,supination/pronation, and 3# for the left. Pt. worked on 3# for  bilateral wrist extension, and radial deviation, leftelbow flexion, extension, and supination/pronation, wristextension.Pt. worked on grasping large flat shapes, and moving them through a shape tower with progressively increasing heights of vertical dowels.   Pt. continuestomakesteadyprogress with BUE strength, and tolerance for there.ex. Pt.requiredfrequent verbalcues, and cues for visual demonstrationfor movement patterns, however required cues for visual demonstration.Pt.required verbal, and tactile cues for  theraband exercises.Pt.continues to require consistent verbal, visual, and tactile cues.Pt.continues torequire cues for radial deviation. Pt.continues to require fewer cues, and assist to maintain upright midline sitting while working out on the [place]. Pt. Required a towel roll for right lateral support.  No assist was needed to maintain upright midline sitting. Pt.continues torequire less cuesto perform UE strengthening exercises, technique, and  formas well as visual demonstration prior to the exercises.Pt. continues to work on improving BUE strength, and [facility] skills in order to improve LUE functioning during ADLs, IADLs, and tranfers.                       OT Education - 06/14/20 1410    Education Details strengthening, UE functioning    Person(s) Educated Patient    Methods Explanation;Demonstration;Handout    Comprehension Returned demonstration;Tactile cues required;Verbalized understanding;Need further instruction               OT Long Term Goals - 05/31/20 1355      OT LONG TERM GOAL #1   Title Pt. will increase left shoulder flexion AROM by 10 degrees to access cabinet/shelf.    Baseline 05/31/2020: Left shoulder flexion 119 degrees. Pt. is able to reach into cabinets with more ease, however has difficulty getting close enough to them with his w/c.Pt. continues to present with limited left shoulder ROM limitng access to cabinets, and shelves.03/01/2020: Left shoulder 116 degrees with new onset of numbness radiating from the left side of the neck to the hand. Pt. continues to progress with left shoulder ROM .Shouler flexion 130, and continues to work on reaching with increasing emphasis on flexing trunk to improve functional reach.  07/21/19- shoulder flexion to 130 with effort    Time 12    Period Weeks    Status On-going    Target Date 08/16/20      OT LONG TERM GOAL #2   Title Pt. will donn a shirt with Supervision.    Baseline 05/31/2020: [name] Pt. is assisting more with UE dressing at home. Pt. continues to require modA donning a zip down jacket with consistent cues to initiate the correct direction of the jacket.  Min-modA mahaging the zipper. MinA for donning a T-shirt with assist to roll side to side to pull the shirt down over his back. 07/21/19 continues to require min assist at times for shirt/jacket    Time 12    Status On-going    Target Date 08/16/20      OT LONG TERM GOAL #4   Title Pt.  will improve left grip strength by 10# to be able to open a jar/container.    Baseline Grip strength cotitnues to be limited. Decreased left sided grip strength today secondary to a new onset of numbenss, and weakness radiating from the neck to the left hand. Pt. continues to have a harder time opening a wide mouthed jar, however is able to open a smaller jar easier.    Time 12    Period Weeks    Status On-going    Target Date 08/16/20      OT LONG TERM GOAL #5   Title Pt. will improve left hand [facility] skills to be able to assist with buttoning/zipping.    Baseline 05/24/2020: Pt. is improving with fastening small buttons. Pt. continues to work on improving [facility] skills  to improve buttoning , and zipping.03/01/2020: Pt.has had a new onset of weakness, and numbness in the left hand beginning upon waking up  this morning which has resulted in a significant change in [facility] skills.  Pt. has improved overall donning a jacket, and  manipulating the zipper on his jacket, however continues to require practice depending on the type of zipper jacket. Pt. continues to work on improving bilateral [facility] skills for buttoning/zipping.  07/21/19 improving with buttons and zippers.    Time 12    Period Weeks    Status On-going    Target Date 08/16/20      OT LONG TERM GOAL #6   Title Pt. will demonstrate visual conmpensatory strategies for 100% of the time during ADLs    Baseline Improving, however Pt. continues to require intermittent cues cues for left sides awareness.    Time 12    Period Weeks    Status On-going    Target Date 08/16/20      OT LONG TERM GOAL #8   Title Pt. will accurately identify potential safety hazard using good safety awareness, and judgement 100% for ADLs, and IADLs.    Baseline Pt. continues to require cues    Time 12    Period Weeks    Status On-going    Target Date 08/16/20      OT LONG TERM GOAL  #9   TITLE Pt. will  navigate his w/c around obstacles with Supervision and 100%  accuracy    Baseline Pt. has progressed, however, continues to require cuing for obstacles on the left with fewer  cues overall. Pt. requires significant cues to engage his left UE during tasks. 07/21/2019 continues to improve with decreased assist at times    Time 12    Period Weeks    Status On-going    Target Date 08/16/20      OT LONG TERM GOAL  #10   TITLE Pt. will independently be able  to reach to place items into cabinetry, and closets.    Baseline Pt. continues to have difficulty with functional reaching continues to be limited.    Time 12    Period Weeks    Status On-going    Target Date 08/16/20      OT LONG TERM GOAL  #11   TITLE Pt. will increase BUE strength by 46mm grades in preparation for ADL transfers.    Baseline 05/31/2020: Pt. is progressing, however BUE strength continues to be limited. Pt. is able to tolerate increased resistive weights. Pt. continues to work towards improving overall strength for ADLs.    Time 12    Period Weeks    Status On-going    Target Date 08/16/20      OT LONG TERM GOAL  #12   TITLE Pt. will improve LUE functional reaching with the left engaging the trunk 100% of the time during ADL tasks with minimal cues.    Baseline Left sided numbness has improved. New onset of left sided numbness, and weakness is affecting his ability to perform reaching.    Time 12    Period Weeks    Status On-going    Target Date 08/16/20                 Plan - 06/14/20 1411    Clinical Impression Statement Pt. continuestomakesteadyprogress with BUE strength, and tolerance for there.ex. Pt.requiredfrequent verbalcues, and cues for visual demonstrationfor movement patterns, however required cues for visual demonstration.Pt.required verbal, and tactile cues for theraband exercises.Pt.continues to require consistent verbal, visual, and tactile cues.Pt.continues torequire cues for radial deviation. Pt.continues to require fewer cues, and assist  to maintain upright midline sitting while working out on the UBE. Pt. Required a towel roll for right lateral support.  No assist was needed to maintain upright midline sitting. Pt.continues torequire less cuesto perform UE strengthening exercises, technique, and formas well as visual demonstration prior to the exercises.Pt. continues to work on improving BUE strength, and [facility] skills in order to improve LUE functioning during ADLs, IADLs, and tranfers.    OT Occupational Profile and History Problem Focused Assessment - Including review of records relating to presenting problem    Occupational performance deficits (Please refer to evaluation for details): ADL's    Body Structure / Function / Physical Skills UE functional use;Coordination;FMC;Dexterity;Strength;ROM    Cognitive Skills Attention;Memory;Emotional;Problem Solve;Safety Awareness    Rehab Potential Good    Clinical Decision Making Several treatment options, min-mod task modification necessary    Comorbidities Affecting Occupational Performance: Presence of comorbidities impacting occupational performance    Comorbidities impacting occupational performance description: Phyical, cognitive, visual,  medical comorbidities    Modification or Assistance to Complete Evaluation  No modification of tasks or assist necessary to complete eval    OT Frequency 2x / week    OT Duration 12 weeks    OT Treatment/Interventions Self-care/ADL training;DME and/or AE instruction;Therapeutic exercise;Therapeutic activities;Moist Heat;Cognitive remediation/compensation;Neuromuscular education;Visual/perceptual remediation/compensation;Coping strategies training;Patient/family education;Passive range of motion;Psychosocial skills training;Energy conservation;Functional Mobility Training    Consulted and Agree with Plan of Care Patient;Family member/caregiver           Patient will benefit from skilled therapeutic intervention in order to improve the  following deficits and impairments:   Body Structure / Function / Physical Skills: UE functional use,Coordination,FMC,Dexterity,Strength,ROM Cognitive Skills: Attention,Memory,Emotional,Problem Solve,Safety Awareness     Visit Diagnosis: Muscle weakness (generalized)  Other lack of coordination    Problem List Patient Active Problem List   Diagnosis Date Noted  . Sepsis ([place]) 10/18/2018  . Long term (current) use of anticoagulants 05/01/2018  . History of stroke 04/11/2018  . Hyperlipidemia 07/04/2017  . Chronic diarrhea 06/12/2017  . 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 Fatigue 02/06/2015  . Visual changes 06/19/2013  . Benign prostatic hyperplasia 01/07/2013  . Depression 01/07/2013  . Restless legs syndrome 01/07/2013  . Chronic kidney disease, stage 3 unspecified ([place]) 12/20/2011  . CAD (coronary artery disease) 11/03/2010  . Hypertension 11/03/2010  . PAD (peripheral artery disease) ([place]) 11/03/2010  . SLE (systemic lupus erythematosus) ([place]) 11/03/2010    Harrel Carina, MS, OTR/L 06/14/2020, 2:14 PM  [place] MAIN [facility] SERVICES [address] [place], [place], [address] Phone: [phone]   Fax:  [phone]  Name: Kenneth Summers MRN: [id number] Date of Birth: May 08, 1945

## 2020-06-16 ENCOUNTER — Ambulatory Visit: Payer: Medicare Other

## 2020-06-16 ENCOUNTER — Ambulatory Visit: Payer: Medicare Other | Admitting: Occupational Therapy

## 2020-06-16 DIAGNOSIS — L89312 Pressure ulcer of right buttock, stage 2: Secondary | ICD-10-CM | POA: Insufficient documentation

## 2020-06-17 ENCOUNTER — Ambulatory Visit: Payer: Medicare Other

## 2020-06-17 ENCOUNTER — Encounter: Payer: Medicare Other | Admitting: Occupational Therapy

## 2020-06-21 ENCOUNTER — Ambulatory Visit: Payer: Medicare Other

## 2020-06-21 ENCOUNTER — Other Ambulatory Visit: Payer: Self-pay

## 2020-06-21 ENCOUNTER — Ambulatory Visit: Payer: Medicare Other | Admitting: Occupational Therapy

## 2020-06-21 ENCOUNTER — Encounter: Payer: Self-pay | Admitting: Occupational Therapy

## 2020-06-21 DIAGNOSIS — R278 Other lack of coordination: Secondary | ICD-10-CM

## 2020-06-21 DIAGNOSIS — M6281 Muscle weakness (generalized): Secondary | ICD-10-CM

## 2020-06-21 DIAGNOSIS — R269 Unspecified abnormalities of gait and mobility: Secondary | ICD-10-CM

## 2020-06-21 DIAGNOSIS — R262 Difficulty in walking, not elsewhere classified: Secondary | ICD-10-CM

## 2020-06-21 NOTE — Therapy (Signed)
[place] MAIN [facility] SERVICES [address] [place], [place], [address] Phone: [phone]   Fax:  [phone]  Physical Therapy Treatment  Patient Details  Name: Kenneth Summers MRN: [id number] Date of Birth: 07/03/1945 No data recorded  Encounter Date: 06/21/2020   PT End of Session - 06/21/20 1252    Visit Number [id number]    Number of Visits 276    Date for PT Re-Evaluation 07/12/20    Authorization Type Medicare reporting period starting 09/11/18; NO FOTO    Authorization Time Period Recert 9/70/2637-09/10/8848    PT Start Time 1300    PT Stop Time 1340    PT Time Calculation (min) 40 min    Equipment Utilized During Treatment Gait belt    Activity Tolerance No increased pain;Patient limited by fatigue;Patient tolerated treatment well    Behavior During Therapy [facility] for tasks assessed/performed           Past Medical History:  Diagnosis Date  . Actinic keratosis   . Alcohol abuse   . APS (antiphospholipid syndrome) ([place])   . Basal cell carcinoma   . BPH (benign prostatic hyperplasia)   . CAD (coronary artery disease)   . Cellulitis   . Cervical spinal stenosis   . Chronic osteomyelitis ([place])   . CKD (chronic kidney disease)   . CKD (chronic kidney disease)   . Clostridium difficile diarrhea   . Depression   . Diplopia   . Fatigue   . GERD (gastroesophageal reflux disease)   . Hyperlipemia   . Hypertension   . Hypovitaminosis D   . IBS (irritable bowel syndrome)   . 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 Insomnia   . Left lumbosacral radiculopathy   . Moderate obstructive sleep apnea   . Osteomyelitis of foot ([place])   . PAD (peripheral artery disease) ([place])   . Pruritus   . RA (rheumatoid arthritis) ([place])   . Radiculopathy   . Restless legs syndrome   . RLS (restless legs syndrome)   . SLE (systemic lupus erythematosus related syndrome) ([place])   . Squamous cell carcinoma of skin 01/27/2019   right mid lower ear helix  . Stroke ([place])    . Toxic maculopathy of both eyes     Past Surgical History:  Procedure Laterality Date  . CARDIAC CATHETERIZATION    . CAROTID ENDARTERECTOMY    . CERVICAL LAMINECTOMY    . CORONARY ANGIOPLASTY    . FRACTURE SURGERY    . fusion C5-6-7    . HERNIA REPAIR    . KNEE ARTHROSCOPY    . TONSILLECTOMY      There were no vitals filed for this visit.   Subjective Assessment - 06/21/20 1252    Subjective Patient reports no new complaints today- states he rested most of the weekend    Patient is accompained by: Family member    Pertinent History Patient is a 75 y.o. male who presents to outpatient physical therapy with a referral for medical diagnosis of CVA. This patient's chief complaints consist of left hemiplegia and overall deconditioning leading to the following functional deficits: dependent for ADLs, IADLs, unable to transfer to car, dependent transfers at home with hoyer lift, unable to walk or stand without significant assistance, difficulty with W/C navigation..    Limitations Sitting;Lifting;Standing;House hold activities;Writing;Walking;Other (comment)    Diagnostic tests Brain MRI 11/01/2017: IMPRESSION: 1. Acute right ACA territory infarct as demonstrated on prior CT imaging. No intracranial hemorrhage or significant mass effect.  2. 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    Patient Stated Goals wants to be able to walk again    Currently in Pain? No/denies    Pain Onset Today    Pain Onset Today                  Interventions:   Active Right hip flex/AAROM 2 sets of 10-15 reps-  Active Right knee ext/AAROM- 2 sets of 10 -15 reps (very minimal ability to kick left LE) - switched to try quad sets yet minimal ability to elicit quad contraction.   Sit to stand - Max A of 2 people - Patient unable to stand > 30 sec x 4 attempts today - unable to stand erect or extend left knee well today.     PVC UE Flex 2 sets of 10 reps- Max cues  and physical assist to raise pipe up overhead-  Active cervical flex/ext x 12 reps  Chin tuck - Max VC to perform correctly x 10 reps Foreward trunk lean (gliding PVC forward/backward  on bars) 2sets of 10 reps UE scapular protraction/retraction using 2 PVC  Education provided throughout session via VC/TC and demonstration to facilitate movement at target joints and correct muscle activation for all testing and exercises performed.     Clinical impression: Patient presents with increased overall fatigue and left LE weakness- unable to stand well despite max A of 2 persons. Patient peformed more posture based activities today and able to participate with physical assist and VC. He will continue to benefit from skilled PT services to improve his functional strength and postural control for improved transfers/standing endurance          PT Education - 06/22/20 1257    Education Details Standing/seated postural education    Person(s) Educated Patient    Methods Explanation;Demonstration;Tactile cues;Verbal cues    Comprehension Verbalized understanding;Returned demonstration;Verbal cues required;Tactile cues required;Need further instruction            PT Short Term Goals - 04/19/20 2317      PT SHORT TERM GOAL #1   Title Be independent with initial home exercise program for self-management of symptoms.    Baseline HEP to be given at second session; advice to practice unsupported sitting at home (06/19/2018); 07/08/18: Performing at home, 6/29: needs assistance but is doing exercise program regularly; 08/22/18: adherent;    Time 2    Period Weeks    Status Achieved    Target Date 06/26/18      PT SHORT TERM GOAL #2   Title Patient will perform sit to stand transfer with mod Assist in parallel bars for improved transfer ability with decreased caregiver assist.    Baseline 04/19/2020- Max assist of 1 person    Time 6    Period Weeks    Status New    Target Date 05/31/20              PT Long Term Goals - 04/19/20 1321      PT LONG TERM GOAL #1   Title Patient will complete all bed mobility with min A to improve functional independence for getting in and out of bed and adjusting in bed.     Baseline max A - total A reported by family (06/19/2018); 07/08/18: still requires maxA, dtr reports improvement in rolling since starting therapy; 6/23: min A to supervision in clinic; not doing at home right; 8/5: Pt's daughter states that his rolling has  improved, but still has difficulty with all other bed mobility; 10/23/18: pts daughter states that she is doing about 35% of the work if he is in proper position, he does uses the bed rails to help roll;  using Harrel Lemon for coming to sit EOB; 10/12: pts daughter states that she is doing about 30% of the work if he is in proper position, he does uses the bed rails to help roll, but it is improving;  using Harrel Lemon for coming to sit EOB, 10/29: min A for sit to sidelying, rolling supervision, able to transition sidelying to sit with HHA; 12/21: min-mod A. 03/01/2020- Patient presents improving bed mobility with current ability to roll to left with CGA and verbal and tactile cues and min assist to roll to right as seen by recent visits. 03/31/2020- Patient and caregiver report that he has maintained ability to roll side to side with only minimal assistance.    Time 12    Period Weeks    Status Achieved      PT LONG TERM GOAL #2   Title Patient will complete sit <> stand transfer chair to chair with Min A and LRAD to improve functional independence for household and community mobility.     Baseline min A for STS, still requires elevated surface height to 27".  03/01/20- Patient currently unable to perform a chair to chair transfer and continues to be transferred using hoyer lift. Patient has been training with sit to stand at parallel bars and able to currently perform sit to stand with max assist  from 27in. high surface.03/31/2020- Patient remains at  [facility] assist with sit to stand transfer and continued use of hoyer lift transfer for all chair to chair or bed to chair transfers. 04/19/2020- Patient requires Max assist with sit to stand transfers and requires hoyer for chair to chair transfers.    Time 12    Period Weeks    Status On-going    Target Date 07/12/20      PT LONG TERM GOAL #3   Title Patient will navigate power w/c with min A x 100 feet to improve mobility for household and short community distances.    Baseline required total A (06/12/2018); able to roll 20 feet with min A (06/20/2018); 07/08/18: Pt can go approximately 10' without assist per family;  10/23/2018: pt's daughter states that his power WC mobility has become 100% better with the new hand control, slowed speed, and with practice; 10/12: pt's daughter reports that he can perform household WC mobility and limited community ambulation with minA    Time 12    Period Weeks    Status Achieved      PT LONG TERM GOAL #4   Title Patient will complete car transfers W/C to family SUV with min A using LRAD to improve his abilty to participate in community activities.     Baseline unable to complete car transfers at all (06/12/2018); unable to complete car transfers at all (06/19/2018).; 07/08/18: Unable to perform at this time; 09/11/18: unable to perform at this time; 10/23/2018: unable to complete car transfers at all; 10/12:  unable to complete car transfers at all, 10/29: unable; 01/13/19: unable; 2/11: min-mod A +2, 3/10: min A+1, 12/21: mod A. 03/01/2020- Patient unable to complete car transfers and continues to require medical transportation at this time. 3/14- Patient reports he may be purchasing a conversion van later this week.    Time 12    Period Weeks    Status  Deferred      PT LONG TERM GOAL #5   Title Patient will ambulate at least 10 feet with min A using LRAD to improve mobility for household and community distances.    Baseline unable to take steps (06/12/2018); able to shift R  leg forward and back with max A and RW at edge of plinth (06/19/2018); 07/08/18: unable to ambulate at this time. 09/11/18: unable to perform at this time; 10/23/2018: unable to perform at this time; 10/12: unable to perform at this time, 10/29: unable at this time; 01/13/19: unable at this time, 2/11: min A to step with RLE, unable to step with LLE, 3/10: able to take 2 steps in parallel bars with min A for safety; 4/1: min A for walking 10 feet in parallel bars, 5/24: min A walking 10 feet in parallel bars; 6/21: no change 7/14: no change. 03/01/2020- Patient unable to ambulate and currently focusing on static standing. He was able to take a forward step with his right LE last week but unable today. 03/31/2020- Patient continues to be non-ambulatory but has been able to move right LE forward with standing but unable to take a step with left LE. 04/19/2020- Goal is still not appropriate as of yet- cont to focus on static and dynamic standing balance/pregait activities.    Time 12    Period Weeks    Status Deferred    Target Date 07/12/20      PT LONG TERM GOAL #6   Title Patient will be able to safety navigate up/down ramp with power chair, exhibiting good safety awareness, mod I to safely enter/exit his home.    Baseline 2/11: supervision with intermittent min A;4/1: supervision; 5/24: supervision. 03/01/2020- patient reports that he is able to navigate down the ramp at home to leave the home but continues to need assist to navigate up the ramp into the home.    Time 12    Period Weeks    Status Partially Met      PT LONG TERM GOAL #7   Title Patient will increase functional reach test to >15 inches in sitting to exhibit improved sitting balance and positioning and reduce fall risk;    Baseline 07/30/18: 12 inches; 09/11/18: 10-12 inches; 10/23/18: 12-13 inch; 10/12: 14.5", 10/29: 15.5 inch    Time 4    Period Weeks    Status Achieved      PT LONG TERM GOAL #8   Title Patient will increase LLE quad  strength to 3+/5 to improve functional strength for standing and mobility;    Baseline 10/28: 2+/5; 01/13/19: 2+/5, 2/11: 3-/5, 04/16/19: 3-/5, 05/08/19: 3-/5, 5/24: 3-/5, 6/21: 3-/5, 7/14: 3-/5, 12/21: 3-/5. 03/01/2020- Left quad= 3-/5 (focusing on quad strengthening during visits and with home program). 03/31/2020- left quad = 3-/5 with MMT. 04/19/2020= 3- /5 with left quad strength with manual muscle testing.    Time 12    Period Weeks    Status On-going    Target Date 07/12/20      PT LONG TERM GOAL  #9   TITLE Patient will report no vertigo with provoking motions or positions in order to be able to perform ADLs and mobility tasks without symptoms.    Baseline 06/30/19: reports no dizziness in last week; 6/21: one episode this weekend; 7/14: experiencing one a week; 03/01/2020.- Patient has denied any dizziness for over past 3 weeks.    Time 12    Period Weeks    Status Achieved  PT LONG TERM GOAL  #10   TITLE Patient will demonstrate improved functional LE/Postural strength as seen by ability to perform static standing for 2 min or greater with max assist  to assist with strength required for ADL's and transfers/pregait/gait activities.    Baseline 03/01/2020- Patient able to stand approx 40 sec today with max assist in parallel bars. 03/31/2020- Patient able to demo static stand with max A for 47 sec today. 04/19/2020- Patient able to demo 2 min today with mod/max assist - yet remains inconsistent.    Time 8    Period Weeks    Status Partially Met    Target Date 07/12/20                 Plan - 06/21/20 1253    Clinical Impression Statement Patient presents with increased overall fatigue and left LE weakness- unable to stand well despite max A of 2 persons. Patient peformed more posture based activities today and able to participate with physical assist and VC. He will continue to benefit from skilled PT services to improve his functional strength and postural control for  improved transfers/standing endurance    Personal Factors and Comorbidities Age;Comorbidity 3+    Comorbidities Relevant past medical history and comorbidities include long term steroid use for lupus, CKD, chronic osteomyelitis, cervical spine stenosis, BPH, APS, alcohol abuse, peripheral artery disease, depression, GERD, obstructive sleep apnea, HTN, IBS, IgA deficiency, lumbar radiculopathy, RA, Stroke, toxic maculopathy in both eyes, systemic lupus erythematosus related syndrome, cardiac catheterization, carotid endarterectomy, cervical laminectomy, coronary angioplasty, Fusion C5-C7, knee arthroplasty, lumbar surgery.    Examination-Activity Limitations Bed Mobility;Bend;Sit;Toileting;Stand;Stairs;Lift;Transfers;Squat;Locomotion Level;Carry;Dressing;Hygiene/Grooming;Continence    Examination-Participation Restrictions Yard Work;Interpersonal Relationship;Community Activity    Stability/Clinical Decision Making Evolving/Moderate complexity    Rehab Potential Fair    PT Frequency 2x / week    PT Duration 12 weeks    PT Treatment/Interventions ADLs/Self Care Home Management;Electrical Stimulation;Moist Heat;Cryotherapy;Gait training;Stair training;Functional mobility training;Therapeutic exercise;Balance training;Neuromuscular re-education;Cognitive remediation;Patient/family education;Orthotic Fit/Training;Wheelchair mobility training;Manual techniques;Passive range of motion;Energy conservation;Joint Manipulations;Canalith Repostioning;Therapeutic activities;Vestibular    PT Next Visit Plan Continue with progressive postural/LE strengthening/pregait activities    PT Home Exercise Plan no changes    Consulted and Agree with Plan of Care Patient;Family member/caregiver           Patient will benefit from skilled therapeutic intervention in order to improve the following deficits and impairments:  Abnormal gait,Decreased activity tolerance,Decreased cognition,Decreased endurance,Decreased knowledge  of use of DME,Decreased range of motion,Decreased skin integrity,Decreased strength,Impaired perceived functional ability,Impaired sensation,Impaired UE functional use,Improper body mechanics,Pain,Cardiopulmonary status limiting activity,Decreased balance,Decreased coordination,Decreased mobility,Difficulty walking,Impaired tone,Postural dysfunction,Dizziness  Visit Diagnosis: Abnormality of gait and mobility  Difficulty in walking, not elsewhere classified  Muscle weakness (generalized)  Other lack of coordination     Problem List Patient Active Problem List   Diagnosis Date Noted  . Sepsis (HCC) 10/18/2018  . Long term (current) use of anticoagulants 05/01/2018  . History of stroke 04/11/2018  . Hyperlipidemia 07/04/2017  . Chronic diarrhea 06/12/2017  . 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 Fatigue 02/06/2015  . Visual changes 06/19/2013  . Benign prostatic hyperplasia 01/07/2013  . Depression 01/07/2013  . Restless legs syndrome 01/07/2013  . Chronic kidney disease, stage 3 unspecified (HCC) 12/20/2011  . CAD (coronary artery disease) 11/03/2010  . Hypertension 11/03/2010  . PAD (peripheral artery disease) (HCC) 11/03/2010  . SLE (systemic lupus erythematosus) (HCC) 11/03/2010  Lewis Moccasin, PT 06/22/2020, 5:14 PM  [place] MAIN [facility] SERVICES [address] [place], [place], [address] Phone: [phone]   Fax:  [phone]  Name: AMANDA POTE MRN: [id number] Date of Birth: 12-10-1945

## 2020-06-21 NOTE — Therapy (Signed)
[place] MAIN [facility] SERVICES [address] [place], [place], [address] Phone: [phone]   Fax:  [phone]  Occupational Therapy Treatment  Patient Details  Name: Kenneth Summers MRN: [id number] Date of Birth: [birth year] No data recorded  Encounter Date: 06/21/2020   OT End of Session - 06/21/20 1535    Visit Number [id number]    Number of Visits 254    Date for OT Re-Evaluation 08/16/20    Authorization Type Progress report period starting 05/31/2020    OT Start Time 1350    OT Stop Time 1430    OT Time Calculation (min) 40 min    Equipment Utilized During Treatment pegboard and patterns    Activity Tolerance Patient tolerated treatment well    Behavior During Therapy WFL for tasks assessed/performed           Past Medical History:  Diagnosis Date  . Actinic keratosis   . Alcohol abuse   . APS (antiphospholipid syndrome) ([place])   . Basal cell carcinoma   . BPH (benign prostatic hyperplasia)   . CAD (coronary artery disease)   . Cellulitis   . Cervical spinal stenosis   . Chronic osteomyelitis ([place])   . CKD (chronic kidney disease)   . CKD (chronic kidney disease)   . Clostridium difficile diarrhea   . Depression   . Diplopia   . Fatigue   . GERD (gastroesophageal reflux disease)   . Hyperlipemia   . Hypertension   . Hypovitaminosis D   . IBS (irritable bowel syndrome)   . 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 Insomnia   . Left lumbosacral radiculopathy   . Moderate obstructive sleep apnea   . Osteomyelitis of foot ([place])   . PAD (peripheral artery disease) ([place])   . Pruritus   . RA (rheumatoid arthritis) ([place])   . Radiculopathy   . Restless legs syndrome   . RLS (restless legs syndrome)   . SLE (systemic lupus erythematosus related syndrome) ([place])   . Squamous cell carcinoma of skin 01/27/2019   right mid lower ear helix  . Stroke ([place])   . Toxic maculopathy of both eyes     Past Surgical History:  Procedure Laterality Date   . CARDIAC CATHETERIZATION    . CAROTID ENDARTERECTOMY    . CERVICAL LAMINECTOMY    . CORONARY ANGIOPLASTY    . FRACTURE SURGERY    . fusion C5-6-7    . HERNIA REPAIR    . KNEE ARTHROSCOPY    . TONSILLECTOMY      There were no vitals filed for this visit.   Subjective Assessment - 06/21/20 1534    Subjective  Pt. reports that his [place]aug[place].    Patient is accompanied by: Family member    Currently in Pain? No/denies          OT TREATMENT   Therapeutic Exercise:  Pt.worked on BUE strengthening, and reciprocal motion using the UBE while seated for 8 min. with no resistance. Pt. performed3#dowelfor UE strengthening secondary to weakness.Pt.tolerated increased dowel weight.Bilateral shoulder flexion, chest press,andcircular patterns,were performedfor1-2 sets10-20reps eachwith cues to keep the dowel symmetrical, and level.Pt. worked on3# dumbbell ex. forrightelbow flexion and extension,supination/pronation, wrist extension, and radial deviation, leftelbow flexion, extension, and supination/pronation, 2# for left wristextension, and radial deviation  Pt.continuestomakesteadyprogress with BUE strength, and tolerance for there.ex. Pt.requiredfrequent verbalcues, and cues for visual demonstrationfor movement patterns, however required cues for visual demonstration.Pt. required verbal, and tactile  cues for theraband exercises.Pt.continues to require consistent verbal, visual, and tactile cues.Pt.continues torequire cues for radial deviation. Pt. required fewer cues, and less assist was required maintain upright midline sitting while working out on the UBE. Pt. Did not require a towel roll today for lateral support. Pt.continues torequire less cuesto perform UE strengthening exercises, technique, and formas well as visual demonstration prior to the exercises.Pt. continues to work on improving BUE strength, and [facility]  skills in order to improve LUE functioning during ADLs, IADLs, and tranfers.                            OT Education - 06/21/20 1535    Education Details strengthening, UE functioning    Person(s) Educated Patient    Methods Explanation;Demonstration;Handout    Comprehension Returned demonstration;Tactile cues required;Verbalized understanding;Need further instruction               OT Long Term Goals - 05/31/20 1355      OT LONG TERM GOAL #1   Title Pt. will increase left shoulder flexion AROM by 10 degrees to access cabinet/shelf.    Baseline 05/31/2020: Left shoulder flexion 119 degrees. Pt. is able to reach into cabinets with more ease, however has difficulty getting close enough to them with his w/c.Pt. continues to present with limited left shoulder ROM limitng access to cabinets, and shelves.03/01/2020: Left shoulder 116 degrees with new onset of numbness radiating from the left side of the neck to the hand. Pt. continues to progress with left shoulder ROM .Shouler flexion 130, and continues to work on reaching with increasing emphasis on flexing trunk to improve functional reach.  07/21/19- shoulder flexion to 130 with effort    Time 12    Period Weeks    Status On-going    Target Date 08/16/20      OT LONG TERM GOAL #2   Title Pt. will donn a shirt with Supervision.    Baseline 05/31/2020: [place]. Pt. is assisting more with UE dressing at home. Pt. continues to require modA donning a zip down jacket with consistent cues to initiate the correct direction of the jacket.  Min-modA mahaging the zipper. MinA for donning a T-shirt with assist to roll side to side to pull the shirt down over his back. 07/21/19 continues to require min assist at times for shirt/jacket    Time 12    Status On-going    Target Date 08/16/20      OT LONG TERM GOAL #4   Title Pt. will improve left grip strength by 10# to be able to open a jar/container.    Baseline Grip strength  cotitnues to be limited. Decreased left sided grip strength today secondary to a new onset of numbenss, and weakness radiating from the neck to the left hand. Pt. continues to have a harder time opening a wide mouthed jar, however is able to open a smaller jar easier.    Time 12    Period Weeks    Status On-going    Target Date 08/16/20      OT LONG TERM GOAL #5   Title Pt. will improve left hand [facility] skills to be able to assist with buttoning/zipping.    Baseline 05/24/2020: Pt. is improving with fastening small buttons. Pt. continues to work on improving [facility] skills  to improve buttoning , and zipping.03/01/2020: Pt.has had a new onset of weakness, and numbness in the left hand beginning upon waking up  this morning which has resulted in a significant change in [facility] skills.  Pt. has improved overall donning a jacket, and  manipulating the zipper on his jacket, however continues to require practice depending on the type of zipper jacket. Pt. continues to work on improving bilateral [facility] skills for buttoning/zipping.  07/21/19 improving with buttons and zippers.    Time 12    Period Weeks    Status On-going    Target Date 08/16/20      OT LONG TERM GOAL #6   Title Pt. will demonstrate visual conmpensatory strategies for 100% of the time during ADLs    Baseline Improving, however Pt. continues to require intermittent cues cues for left sides awareness.    Time 12    Period Weeks    Status On-going    Target Date 08/16/20      OT LONG TERM GOAL #8   Title Pt. will accurately identify potential safety hazard using good safety awareness, and judgement 100% for ADLs, and IADLs.    Baseline Pt. continues to require cues    Time 12    Period Weeks    Status On-going    Target Date 08/16/20      OT LONG TERM GOAL  #9   TITLE Pt. will  navigate his w/c around obstacles with Supervision and 100% accuracy    Baseline Pt. has progressed, however, continues to require cuing for obstacles on the left with  fewer  cues overall. Pt. requires significant cues to engage his left UE during tasks. 07/21/2019 continues to improve with decreased assist at times    Time 12    Period Weeks    Status On-going    Target Date 08/16/20      OT LONG TERM GOAL  #10   TITLE Pt. will independently be able  to reach to place items into cabinetry, and closets.    Baseline Pt. continues to have difficulty with functional reaching continues to be limited.    Time 12    Period Weeks    Status On-going    Target Date 08/16/20      OT LONG TERM GOAL  #11   TITLE Pt. will increase BUE strength by 13mm grades in preparation for ADL transfers.    Baseline 05/31/2020: Pt. is progressing, however BUE strength continues to be limited. Pt. is able to tolerate increased resistive weights. Pt. continues to work towards improving overall strength for ADLs.    Time 12    Period Weeks    Status On-going    Target Date 08/16/20      OT LONG TERM GOAL  #12   TITLE Pt. will improve LUE functional reaching with the left engaging the trunk 100% of the time during ADL tasks with minimal cues.    Baseline Left sided numbness has improved. New onset of left sided numbness, and weakness is affecting his ability to perform reaching.    Time 12    Period Weeks    Status On-going    Target Date 08/16/20                 Plan - 06/21/20 1536    Clinical Impression Statement Pt.continuestomakesteadyprogress with BUE strength, and tolerance for there.ex. Pt.requiredfrequent verbalcues, and cues for visual demonstrationfor movement patterns, however required cues for visual demonstration.Pt. required verbal, and tactile cues for theraband exercises.Pt.continues to require consistent verbal, visual, and tactile cues.Pt.continues torequire cues for radial deviation. Pt. required fewer cues, and less assist  was required maintain upright midline sitting while working out on the UBE. Pt. Did not require a towel roll today  for lateral support. Pt.continues torequire less cuesto perform UE strengthening exercises, technique, and formas well as visual demonstration prior to the exercises.Pt. continues to work on improving BUE strength, and [facility] skills in order to improve LUE functioning during ADLs, IADLs, and tranfers.   OT Occupational Profile and History Problem Focused Assessment - Including review of records relating to presenting problem    Occupational performance deficits (Please refer to evaluation for details): ADL's    Body Structure / Function / Physical Skills UE functional use;Coordination;FMC;Dexterity;Strength;ROM    Cognitive Skills Attention;Memory;Emotional;Problem Solve;Safety Awareness    Rehab Potential Good    Clinical Decision Making Several treatment options, min-mod task modification necessary    Comorbidities Affecting Occupational Performance: Presence of comorbidities impacting occupational performance    Comorbidities impacting occupational performance description: Phyical, cognitive, visual,  medical comorbidities    Modification or Assistance to Complete Evaluation  No modification of tasks or assist necessary to complete eval    OT Frequency 2x / week    OT Duration 12 weeks    OT Treatment/Interventions Self-care/ADL training;DME and/or AE instruction;Therapeutic exercise;Therapeutic activities;Moist Heat;Cognitive remediation/compensation;Neuromuscular education;Visual/perceptual remediation/compensation;Coping strategies training;Patient/family education;Passive range of motion;Psychosocial skills training;Energy conservation;Functional Mobility Training    Plan Continue with current OT plan of care.    Consulted and Agree with Plan of Care Patient;Family member/caregiver    Family Member Consulted Caregiver Ch[place]           Patient will benefit from skilled therapeutic intervention in order to improve the following deficits and impairments:   Body Structure / Function /  Physical Skills: UE functional use,Coordination,FMC,Dexterity,Strength,ROM Cognitive Skills: Attention,Memory,Emotional,Problem Solve,Safety Awareness     Visit Diagnosis: Muscle weakness (generalized)  Other lack of coordination    Problem List Patient Active Problem List   Diagnosis Date Noted  . Sepsis ([place]) 10/18/2018  . Long term (current) use of anticoagulants 05/01/2018  . History of stroke 04/11/2018  . Hyperlipidemia 07/04/2017  . Chronic diarrhea 06/12/2017  . 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 Fatigue 02/06/2015  . Visual changes 06/19/2013  . Benign prostatic hyperplasia 01/07/2013  . Depression 01/07/2013  . Restless legs syndrome 01/07/2013  . Chronic kidney disease, stage 3 unspecified ([place]) 12/20/2011  . CAD (coronary artery disease) 11/03/2010  . Hypertension 11/03/2010  . PAD (peripheral artery disease) ([place]) 11/03/2010  . SLE (systemic lupus erythematosus) ([place]) 11/03/2010    Harrel Carina, MS, OTR/L 06/21/2020, 3:38 PM  [place] MAIN [facility] SERVICES [address] [place], [place], [address] Phone: [phone]   Fax:  [phone]  Name: ABDALLAH HERN MRN: [id number] Date of Birth: [birth year]

## 2020-06-23 ENCOUNTER — Ambulatory Visit: Payer: Medicare Other

## 2020-06-23 ENCOUNTER — Ambulatory Visit: Payer: Medicare Other | Admitting: Occupational Therapy

## 2020-06-23 ENCOUNTER — Encounter: Payer: Self-pay | Admitting: Occupational Therapy

## 2020-06-23 ENCOUNTER — Other Ambulatory Visit: Payer: Self-pay

## 2020-06-23 DIAGNOSIS — R262 Difficulty in walking, not elsewhere classified: Secondary | ICD-10-CM

## 2020-06-23 DIAGNOSIS — R269 Unspecified abnormalities of gait and mobility: Secondary | ICD-10-CM

## 2020-06-23 DIAGNOSIS — R278 Other lack of coordination: Secondary | ICD-10-CM

## 2020-06-23 DIAGNOSIS — M6281 Muscle weakness (generalized): Secondary | ICD-10-CM

## 2020-06-23 NOTE — Therapy (Signed)
[place] MAIN [facility] SERVICES [address] [place], [place], [address] Phone: [phone]   Fax:  [phone]  Physical Therapy Treatment  Patient Details  Name: Kenneth Summers MRN: [id number] Date of Birth: [birth year] No data recorded  Encounter Date: 06/23/2020   PT End of Session - 06/23/20 1350    Visit Number [id number]    Number of Visits 276    Date for PT Re-Evaluation 07/12/20    Authorization Type Medicare reporting period starting 09/11/18; NO FOTO    Authorization Time Period Recert [id number]    PT Start Time 1300    PT Stop Time 1343    PT Time Calculation (min) 43 min    Equipment Utilized During Treatment Gait belt    Activity Tolerance No increased pain;Patient limited by fatigue;Patient tolerated treatment well    Behavior During Therapy [facility] for tasks assessed/performed           Past Medical History:  Diagnosis Date  . Actinic keratosis   . Alcohol abuse   . APS (antiphospholipid syndrome) ([place])   . Basal cell carcinoma   . BPH (benign prostatic hyperplasia)   . CAD (coronary artery disease)   . Cellulitis   . Cervical spinal stenosis   . Chronic osteomyelitis ([place])   . CKD (chronic kidney disease)   . CKD (chronic kidney disease)   . Clostridium difficile diarrhea   . Depression   . Diplopia   . Fatigue   . GERD (gastroesophageal reflux disease)   . Hyperlipemia   . Hypertension   . Hypovitaminosis D   . IBS (irritable bowel syndrome)   . 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 Insomnia   . Left lumbosacral radiculopathy   . Moderate obstructive sleep apnea   . Osteomyelitis of foot ([place])   . PAD (peripheral artery disease) ([place])   . Pruritus   . RA (rheumatoid arthritis) ([place])   . Radiculopathy   . Restless legs syndrome   . RLS (restless legs syndrome)   . SLE (systemic lupus erythematosus related syndrome) ([place])   . Squamous cell carcinoma of skin 01/27/2019   right mid lower ear helix  . Stroke ([place])    . Toxic maculopathy of both eyes     Past Surgical History:  Procedure Laterality Date  . CARDIAC CATHETERIZATION    . CAROTID ENDARTERECTOMY    . CERVICAL LAMINECTOMY    . CORONARY ANGIOPLASTY    . FRACTURE SURGERY    . fusion C5-6-7    . HERNIA REPAIR    . KNEE ARTHROSCOPY    . TONSILLECTOMY      There were no vitals filed for this visit.   Subjective Assessment - 06/23/20 1333    Subjective Patient reports no new complaints today- states he rested most of the weekend    Patient is accompained by: Family member    Pertinent History Patient is a 75 y.o. male who presents to outpatient physical therapy with a referral for medical diagnosis of CVA. This patient's chief complaints consist of left hemiplegia and overall deconditioning leading to the following functional deficits: dependent for ADLs, IADLs, unable to transfer to car, dependent transfers at home with hoyer lift, unable to walk or stand without significant assistance, difficulty with W/C navigation..    Limitations Sitting;Lifting;Standing;House hold activities;Writing;Walking;Other (comment)    Diagnostic tests Brain MRI 11/01/2017: IMPRESSION: 1. Acute right ACA territory infarct as demonstrated on prior CT imaging. No intracranial hemorrhage or significant mass effect.  2. 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    Patient Stated Goals wants to be able to walk again    Pain Onset Today    Pain Onset Today              Interventions:     Seated hip march BLE (AAROM on left/AROM on right ) x 15 reps Seated knee ext (AAROM on left/AROM on right) x 15 reps each   Patient required min assist of 1 person to ensure he did not slouch Standing trials with 2 person assist (1 on each side today) 1) 1 min 10 sec- max assist of 2 persons  - Patient unable to stand erect today requiring max Assist of 2 people to assist with trunk ext. 2) 1 min 3 sec- Slight improvement with ability to  extend  trunk with max VC/TC 3) 46 sec -  4)40 sec-  Blocked left knee - Patient unable to stand up straight and extend hips.  5) 18 sec - able to stand one last time so he could be repositioned in the chair  Clinical Impression:  Patient was able to return to some standing vs. Previous visit. He continues to exhibit increased overall LE muscle weakness as seen by decreased standing times and increased physical assistance to stand. He was able to extend trunk some with standing yet inconsistent at best and continues to rely heavily on physical assistance.   He will continue to benefit from skilled PT services to improve his functional strength and postural control for improved transfers/standing endurance                           PT Education - 06/23/20 1349    Education Details Postural cues with standing    Person(s) Educated Patient    Methods Explanation;Demonstration;Tactile cues;Verbal cues    Comprehension Verbalized understanding;Returned demonstration;Verbal cues required;Tactile cues required;Need further instruction            PT Short Term Goals - 04/19/20 2317      PT SHORT TERM GOAL #1   Title Be independent with initial home exercise program for self-management of symptoms.    Baseline HEP to be given at second session; advice to practice unsupported sitting at home (06/19/2018); 07/08/18: Performing at home, 6/29: needs assistance but is doing exercise program regularly; 08/22/18: adherent;    Time 2    Period Weeks    Status Achieved    Target Date 06/26/18      PT SHORT TERM GOAL #2   Title Patient will perform sit to stand transfer with mod Assist in parallel bars for improved transfer ability with decreased caregiver assist.    Baseline 04/19/2020- Max assist of 1 person    Time 6    Period Weeks    Status New    Target Date 05/31/20             PT Long Term Goals - 04/19/20 1321      PT LONG TERM GOAL #1   Title Patient will complete  all bed mobility with min A to improve functional independence for getting in and out of bed and adjusting in bed.     Baseline max A - total A reported by family (06/19/2018); 07/08/18: still requires maxA, dtr reports improvement in rolling since starting therapy; 6/23: min A to supervision in clinic; not doing at home right; 8/5: Pt's daughter states that  his rolling has improved, but still has difficulty with all other bed mobility; 10/23/18: pts daughter states that she is doing about 35% of the work if he is in proper position, he does uses the bed rails to help roll;  using Harrel Lemon for coming to sit EOB; 10/12: pts daughter states that she is doing about 30% of the work if he is in proper position, he does uses the bed rails to help roll, but it is improving;  using Harrel Lemon for coming to sit EOB, 10/29: min A for sit to sidelying, rolling supervision, able to transition sidelying to sit with HHA; 12/21: min-mod A. 03/01/2020- Patient presents improving bed mobility with current ability to roll to left with CGA and verbal and tactile cues and min assist to roll to right as seen by recent visits. 03/31/2020- Patient and caregiver report that he has maintained ability to roll side to side with only minimal assistance.    Time 12    Period Weeks    Status Achieved      PT LONG TERM GOAL #2   Title Patient will complete sit <> stand transfer chair to chair with Min A and LRAD to improve functional independence for household and community mobility.     Baseline min A for STS, still requires elevated surface height to 27".  03/01/20- Patient currently unable to perform a chair to chair transfer and continues to be transferred using hoyer lift. Patient has been training with sit to stand at parallel bars and able to currently perform sit to stand with max assist  from 27in. high surface.03/31/2020- Patient remains at [facility] assist with sit to stand transfer and continued use of hoyer lift transfer for all chair to chair  or bed to chair transfers. 04/19/2020- Patient requires Max assist with sit to stand transfers and requires hoyer for chair to chair transfers.    Time 12    Period Weeks    Status On-going    Target Date 07/12/20      PT LONG TERM GOAL #3   Title Patient will navigate power w/c with min A x 100 feet to improve mobility for household and short community distances.    Baseline required total A (06/12/2018); able to roll 20 feet with min A (06/20/2018); 07/08/18: Pt can go approximately 10' without assist per family;  10/23/2018: pt's daughter states that his power WC mobility has become 100% better with the new hand control, slowed speed, and with practice; 10/12: pt's daughter reports that he can perform household WC mobility and limited community ambulation with minA    Time 12    Period Weeks    Status Achieved      PT LONG TERM GOAL #4   Title Patient will complete car transfers W/C to family SUV with min A using LRAD to improve his abilty to participate in community activities.     Baseline unable to complete car transfers at all (06/12/2018); unable to complete car transfers at all (06/19/2018).; 07/08/18: Unable to perform at this time; 09/11/18: unable to perform at this time; 10/23/2018: unable to complete car transfers at all; 10/12:  unable to complete car transfers at all, 10/29: unable; 01/13/19: unable; 2/11: min-mod A +2, 3/10: min A+1, 12/21: mod A. 03/01/2020- Patient unable to complete car transfers and continues to require medical transportation at this time. 3/14- Patient reports he may be purchasing a conversion van later this week.    Time 12    Period Weeks  Status Deferred      PT LONG TERM GOAL #5   Title Patient will ambulate at least 10 feet with min A using LRAD to improve mobility for household and community distances.    Baseline unable to take steps (06/12/2018); able to shift R leg forward and back with max A and RW at edge of plinth (06/19/2018); 07/08/18: unable to ambulate at  this time. 09/11/18: unable to perform at this time; 10/23/2018: unable to perform at this time; 10/12: unable to perform at this time, 10/29: unable at this time; 01/13/19: unable at this time, 2/11: min A to step with RLE, unable to step with LLE, 3/10: able to take 2 steps in parallel bars with min A for safety; 4/1: min A for walking 10 feet in parallel bars, 5/24: min A walking 10 feet in parallel bars; 6/21: no change 7/14: no change. 03/01/2020- Patient unable to ambulate and currently focusing on static standing. He was able to take a forward step with his right LE last week but unable today. 03/31/2020- Patient continues to be non-ambulatory but has been able to move right LE forward with standing but unable to take a step with left LE. 04/19/2020- Goal is still not appropriate as of yet- cont to focus on static and dynamic standing balance/pregait activities.    Time 12    Period Weeks    Status Deferred    Target Date 07/12/20      PT LONG TERM GOAL #6   Title Patient will be able to safety navigate up/down ramp with power chair, exhibiting good safety awareness, mod I to safely enter/exit his home.    Baseline 2/11: supervision with intermittent min A;4/1: supervision; 5/24: supervision. 03/01/2020- patient reports that he is able to navigate down the ramp at home to leave the home but continues to need assist to navigate up the ramp into the home.    Time 12    Period Weeks    Status Partially Met      PT LONG TERM GOAL #7   Title Patient will increase functional reach test to >15 inches in sitting to exhibit improved sitting balance and positioning and reduce fall risk;    Baseline 07/30/18: 12 inches; 09/11/18: 10-12 inches; 10/23/18: 12-13 inch; 10/12: 14.5", 10/29: 15.5 inch    Time 4    Period Weeks    Status Achieved      PT LONG TERM GOAL #8   Title Patient will increase LLE quad strength to 3+/5 to improve functional strength for standing and mobility;    Baseline 10/28: 2+/5;  01/13/19: 2+/5, 2/11: 3-/5, 04/16/19: 3-/5, 05/08/19: 3-/5, 5/24: 3-/5, 6/21: 3-/5, 7/14: 3-/5, 12/21: 3-/5. 03/01/2020- Left quad= 3-/5 (focusing on quad strengthening during visits and with home program). 03/31/2020- left quad = 3-/5 with MMT. 04/19/2020= 3- /5 with left quad strength with manual muscle testing.    Time 12    Period Weeks    Status On-going    Target Date 07/12/20      PT LONG TERM GOAL  #9   TITLE Patient will report no vertigo with provoking motions or positions in order to be able to perform ADLs and mobility tasks without symptoms.    Baseline 06/30/19: reports no dizziness in last week; 6/21: one episode this weekend; 7/14: experiencing one a week; 03/01/2020.- Patient has denied any dizziness for over past 3 weeks.    Time 12    Period Weeks    Status Achieved  PT LONG TERM GOAL  #10   TITLE Patient will demonstrate improved functional LE/Postural strength as seen by ability to perform static standing for 2 min or greater with max assist  to assist with strength required for ADL's and transfers/pregait/gait activities.    Baseline 03/01/2020- Patient able to stand approx 40 sec today with max assist in parallel bars. 03/31/2020- Patient able to demo static stand with max A for 19mn 47 sec today. 04/19/2020- Patient able to demo 2 min today with mod/max assist - yet remains inconsistent.    Time 8    Period Weeks    Status Partially Met    Target Date 07/12/20                 Plan - 06/23/20 1350    Clinical Impression Statement Patient was able to return to some standing vs. Previous visit. He continues to exhibit increased overall LE muscle weakness as seen by decreased standing times and increased physical assistance to stand. He was able to extend trunk some with standing yet inconsistent at best and continues to rely heavily on physical assistance.   He will continue to benefit from skilled PT services to improve his functional strength and postural control for  improved transfers/standing endurance    Personal Factors and Comorbidities Age;Comorbidity 3+    Comorbidities Relevant past medical history and comorbidities include long term steroid use for lupus, CKD, chronic osteomyelitis, cervical spine stenosis, BPH, APS, alcohol abuse, peripheral artery disease, depression, GERD, obstructive sleep apnea, HTN, IBS, IgA deficiency, lumbar radiculopathy, RA, Stroke, toxic maculopathy in both eyes, systemic lupus erythematosus related syndrome, cardiac catheterization, carotid endarterectomy, cervical laminectomy, coronary angioplasty, Fusion C5-C7, knee arthroplasty, lumbar surgery.    Examination-Activity Limitations Bed Mobility;Bend;Sit;Toileting;Stand;Stairs;Lift;Transfers;Squat;Locomotion Level;Carry;Dressing;Hygiene/Grooming;Continence    Examination-Participation Restrictions Yard Work;Interpersonal Relationship;Community Activity    Stability/Clinical Decision Making Evolving/Moderate complexity    Rehab Potential Fair    PT Frequency 2x / week    PT Duration 12 weeks    PT Treatment/Interventions ADLs/Self Care Home Management;Electrical Stimulation;Moist Heat;Cryotherapy;Gait training;Stair training;Functional mobility training;Therapeutic exercise;Balance training;Neuromuscular re-education;Cognitive remediation;Patient/family education;Orthotic Fit/Training;Wheelchair mobility training;Manual techniques;Passive range of motion;Energy conservation;Joint Manipulations;Canalith Repostioning;Therapeutic activities;Vestibular    PT Next Visit Plan Continue with progressive postural/LE strengthening/pregait activities    PT Home Exercise Plan no changes    Consulted and Agree with Plan of Care Patient;Family member/caregiver           Patient will benefit from skilled therapeutic intervention in order to improve the following deficits and impairments:  Abnormal gait,Decreased activity tolerance,Decreased cognition,Decreased endurance,Decreased knowledge  of use of DME,Decreased range of motion,Decreased skin integrity,Decreased strength,Impaired perceived functional ability,Impaired sensation,Impaired UE functional use,Improper body mechanics,Pain,Cardiopulmonary status limiting activity,Decreased balance,Decreased coordination,Decreased mobility,Difficulty walking,Impaired tone,Postural dysfunction,Dizziness  Visit Diagnosis: Abnormality of gait and mobility  Difficulty in walking, not elsewhere classified  Muscle weakness (generalized)  Other lack of coordination     Problem List Patient Active Problem List   Diagnosis Date Noted  . Sepsis ([place] 10/18/2018  . Long term (current) use of anticoagulants 05/01/2018  . History of stroke 04/11/2018  . Hyperlipidemia 07/04/2017  . Chronic diarrhea 06/12/2017  . 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 Fatigue 02/06/2015  . Visual changes 06/19/2013  . Benign prostatic hyperplasia 01/07/2013  . Depression 01/07/2013  . Restless legs syndrome 01/07/2013  . Chronic kidney disease, stage 3 unspecified ([place] 12/20/2011  . CAD (coronary artery disease) 11/03/2010  .  Hypertension 11/03/2010  . PAD (peripheral artery disease) ([place]) 11/03/2010  . SLE (systemic lupus erythematosus) ([place]) 11/03/2010    Lewis Moccasin, PT 06/23/2020, 2:00 PM  [place] MAIN [facility] SERVICES [address] [place], [place], [address] Phone: [phone]   Fax:  [phone]  Name: BOB DAVERSA MRN: [id number] Date of Birth: 07/17/1945

## 2020-06-23 NOTE — Therapy (Signed)
Kenneth Summers [facility] SERVICES [address] [place], [place], [address] Phone: [phone]   Fax:  [phone]  Occupational Therapy Treatment  Patient Details  Name: Kenneth Summers MRN: [id number] Date of Birth: [birth year] No data recorded  Encounter Date: 06/23/2020   OT End of Session - 06/23/20 1437    Visit Number [id number]    Number of Visits 254    Date for OT Re-Evaluation 08/16/20    Authorization Type Progress report period starting 05/31/2020    OT Start Time 1345    OT Stop Time 1430    OT Time Calculation (min) 45 min    Equipment Utilized During Treatment pegboard and patterns    Activity Tolerance Patient tolerated treatment well    Behavior During Therapy WFL for tasks assessed/performed           Past Medical History:  Diagnosis Date  . Actinic keratosis   . Alcohol abuse   . APS (antiphospholipid syndrome) ([place])   . Basal cell carcinoma   . BPH (benign prostatic hyperplasia)   . CAD (coronary artery disease)   . Cellulitis   . Cervical spinal stenosis   . Chronic osteomyelitis ([place])   . CKD (chronic kidney disease)   . CKD (chronic kidney disease)   . Clostridium difficile diarrhea   . Depression   . Diplopia   . Fatigue   . GERD (gastroesophageal reflux disease)   . Hyperlipemia   . Hypertension   . Hypovitaminosis D   . IBS (irritable bowel syndrome)   . 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 Insomnia   . Left lumbosacral radiculopathy   . Moderate obstructive sleep apnea   . Osteomyelitis of foot ([place])   . PAD (peripheral artery disease) ([place])   . Pruritus   . RA (rheumatoid arthritis) ([place])   . Radiculopathy   . Restless legs syndrome   . RLS (restless legs syndrome)   . SLE (systemic lupus erythematosus related syndrome) ([place])   . Squamous cell carcinoma of skin 01/27/2019   right mid lower ear helix  . Stroke ([place])   . Toxic maculopathy of both eyes     Past Surgical History:  Procedure Laterality Date   . CARDIAC CATHETERIZATION    . CAROTID ENDARTERECTOMY    . CERVICAL LAMINECTOMY    . CORONARY ANGIOPLASTY    . FRACTURE SURGERY    . fusion C5-6-7    . HERNIA REPAIR    . KNEE ARTHROSCOPY    . TONSILLECTOMY      There were no vitals filed for this visit.   Subjective Assessment - 06/23/20 1437    Subjective  Pt. reports that his daugghter went [place] for [place].    Patient is accompanied by: Family member    Pertinent History Pt. is a 75 y.o. 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 Pt. PMHx includes: Multiple Falls, Lupus, DJD, Renal Abscess, CVA. Pt. resides with his wife. Pt.'s wife and daughter assist with ADLs. Pt. has caregivers in  for 2 hours a day, 6 days a week. Pt. received Rehab services in acute care, at SNF for STR, and [place] services. Pt. is retired from [employer] for [employer] and [employer].    Currently in Pain? No/denies          OT TREATMENT   Neuromuscular re-ed:  Pt. Worked on grasping coins from a tabletop surface, placing them into  a resistive container, and pushing them through the slot while isolating his left 2nd digit.  Therapeutic Exercise:  Pt.worked on BUE strengthening, and reciprocal motion using the UBE while seated for 8 min. with no resistance. Pt. performed2.5#dowelfor UE strengthening secondary to weakness.Pt.tolerated increased dowel weight.Bilateral shoulder flexion, chest press,andcircular patterns,were performedfor1-2 sets10-20reps eachwith cues to keep the dowel symmetrical, and level.Pt. worked on4# dumbbell ex. forrightelbow flexion and extension,supination/pronation,3# wrist extension, and radial deviation, leftelbow flexion, extension, and supination/pronation, 2# for left wristextension, and radial deviation  Pt.continuestomakesteadyprogress with BUE strength, and tolerance for there.ex. Pt.requiredfrequent verbalcues, and cues for visual  demonstrationfor movement patterns, however required cues for visual demonstration.Pt.continues to require consistent verbal, visual, and tactile cues.Pt.continues torequire cues for radial deviation.Pt.requiredfewer cues, and less assist was required maintain upright midline sitting while working out on the UBE. Pt. did not require a towel roll today for lateral support. Pt. required verbal cues to use his left hand to grasp coins, and place them through the resistive container. Pt.continues torequire less cuesto perform UE strengthening exercises, technique, and formas well as visual demonstration prior to the exercises.Pt. continues to work on improving BUE strength, and [facility] skills in order to improve LUE functioning during ADLs, IADLs, and tranfers.                          OT Education - 06/23/20 1437    Education Details strengthening, UE functioning    Person(s) Educated Patient    Methods Explanation;Demonstration;Handout    Comprehension Returned demonstration;Tactile cues required;Verbalized understanding;Need further instruction               OT Long Term Goals - 05/31/20 1355      OT LONG TERM GOAL #1   Title Pt. will increase left shoulder flexion AROM by 10 degrees to access cabinet/shelf.    Baseline 05/31/2020: Left shoulder flexion 119 degrees. Pt. is able to reach into cabinets with more ease, however has difficulty getting close enough to them with his w/c.Pt. continues to present with limited left shoulder ROM limitng access to cabinets, and shelves.03/01/2020: Left shoulder 116 degrees with new onset of numbness radiating from the left side of the neck to the hand. Pt. continues to progress with left shoulder ROM .Shouler flexion 130, and continues to work on reaching with increasing emphasis on flexing trunk to improve functional reach.  07/21/19- shoulder flexion to 130 with effort    Time 12    Period Weeks    Status On-going     Target Date 08/16/20      OT LONG TERM GOAL #2   Title Pt. will donn a shirt with Supervision.    Baseline 05/31/2020: [place]. Pt. is assisting more with UE dressing at home. Pt. continues to require modA donning a zip down jacket with consistent cues to initiate the correct direction of the jacket.  Min-modA mahaging the zipper. MinA for donning a T-shirt with assist to roll side to side to pull the shirt down over his back. 07/21/19 continues to require min assist at times for shirt/jacket    Time 12    Status On-going    Target Date 08/16/20      OT LONG TERM GOAL #4   Title Pt. will improve left grip strength by 10# to be able to open a jar/container.    Baseline Grip strength cotitnues to be limited. Decreased left sided grip strength today secondary to a new onset of numbenss, and  weakness radiating from the neck to the left hand. Pt. continues to have a harder time opening a wide mouthed jar, however is able to open a smaller jar easier.    Time 12    Period Weeks    Status On-going    Target Date 08/16/20      OT LONG TERM GOAL #5   Title Pt. will improve left hand [facility] skills to be able to assist with buttoning/zipping.    Baseline 05/24/2020: Pt. is improving with fastening small buttons. Pt. continues to work on improving [facility] skills  to improve buttoning , and zipping.03/01/2020: Pt.has had a new onset of weakness, and numbness in the left hand beginning upon waking up this morning which has resulted in a significant change in [facility] skills.  Pt. has improved overall donning a jacket, and  manipulating the zipper on his jacket, however continues to require practice depending on the type of zipper jacket. Pt. continues to work on improving bilateral [facility] skills for buttoning/zipping.  07/21/19 improving with buttons and zippers.    Time 12    Period Weeks    Status On-going    Target Date 08/16/20      OT LONG TERM GOAL #6   Title Pt. will demonstrate visual conmpensatory strategies for 100%  of the time during ADLs    Baseline Improving, however Pt. continues to require intermittent cues cues for left sides awareness.    Time 12    Period Weeks    Status On-going    Target Date 08/16/20      OT LONG TERM GOAL #8   Title Pt. will accurately identify potential safety hazard using good safety awareness, and judgement 100% for ADLs, and IADLs.    Baseline Pt. continues to require cues    Time 12    Period Weeks    Status On-going    Target Date 08/16/20      OT LONG TERM GOAL  #9   TITLE Pt. will  navigate his w/c around obstacles with Supervision and 100% accuracy    Baseline Pt. has progressed, however, continues to require cuing for obstacles on the left with fewer  cues overall. Pt. requires significant cues to engage his left UE during tasks. 07/21/2019 continues to improve with decreased assist at times    Time 12    Period Weeks    Status On-going    Target Date 08/16/20      OT LONG TERM GOAL  #10   TITLE Pt. will independently be able  to reach to place items into cabinetry, and closets.    Baseline Pt. continues to have difficulty with functional reaching continues to be limited.    Time 12    Period Weeks    Status On-going    Target Date 08/16/20      OT LONG TERM GOAL  #11   TITLE Pt. will increase BUE strength by 68mm grades in preparation for ADL transfers.    Baseline 05/31/2020: Pt. is progressing, however BUE strength continues to be limited. Pt. is able to tolerate increased resistive weights. Pt. continues to work towards improving overall strength for ADLs.    Time 12    Period Weeks    Status On-going    Target Date 08/16/20      OT LONG TERM GOAL  #12   TITLE Pt. will improve LUE functional reaching with the left engaging the trunk 100% of the time during ADL tasks with minimal cues.  Baseline Left sided numbness has improved. New onset of left sided numbness, and weakness is affecting his ability to perform reaching.    Time 12    Period  Weeks    Status On-going    Target Date 08/16/20                 Plan - 06/23/20 1438    Clinical Impression Statement Pt.continuestomakesteadyprogress with BUE strength, and tolerance for there.ex. Pt.requiredfrequent verbalcues, and cues for visual demonstrationfor movement patterns, however required cues for visual demonstration.Pt.continues to require consistent verbal, visual, and tactile cues.Pt.continues torequire cues for radial deviation.Pt.requiredfewer cues, and less assist was required maintain upright midline sitting while working out on the UBE. Pt. did not require a towel roll today for lateral support. Pt. required verbal cues to use his left hand to grasp coins, and place them through the resistive container. Pt.continues torequire less cuesto perform UE strengthening exercises, technique, and formas well as visual demonstration prior to the exercises.Pt. continues to work on improving BUE strength, and [facility] skills in order to improve LUE functioning during ADLs, IADLs, and tranfers.   OT Occupational Profile and History Problem Focused Assessment - Including review of records relating to presenting problem    Occupational performance deficits (Please refer to evaluation for details): ADL's    Body Structure / Function / Physical Skills UE functional use;Coordination;FMC;Dexterity;Strength;ROM    Cognitive Skills Attention;Memory;Emotional;Problem Solve;Safety Awareness    Rehab Potential Good    Clinical Decision Making Several treatment options, min-mod task modification necessary    Comorbidities Affecting Occupational Performance: Presence of comorbidities impacting occupational performance    Comorbidities impacting occupational performance description: Phyical, cognitive, visual,  medical comorbidities    Modification or Assistance to Complete Evaluation  No modification of tasks or assist necessary to complete eval    OT Frequency 2x / week     OT Duration 12 weeks    OT Treatment/Interventions Self-care/ADL training;DME and/or AE instruction;Therapeutic exercise;Therapeutic activities;Moist Heat;Cognitive remediation/compensation;Neuromuscular education;Visual/perceptual remediation/compensation;Coping strategies training;Patient/family education;Passive range of motion;Psychosocial skills training;Energy conservation;Functional Mobility Training    Consulted and Agree with Plan of Care Patient;Family member/caregiver           Patient will benefit from skilled therapeutic intervention in order to improve the following deficits and impairments:   Body Structure / Function / Physical Skills: UE functional use,Coordination,FMC,Dexterity,Strength,ROM Cognitive Skills: Attention,Memory,Emotional,Problem Solve,Safety Awareness     Visit Diagnosis: Muscle weakness (generalized)  Other lack of coordination    Problem List Patient Active Problem List   Diagnosis Date Noted  . Sepsis ([place]) 10/18/2018  . Long term (current) use of anticoagulants 05/01/2018  . History of stroke 04/11/2018  . Hyperlipidemia 07/04/2017  . Chronic diarrhea 06/12/2017  . 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 Fatigue 02/06/2015  . Visual changes 06/19/2013  . Benign prostatic hyperplasia 01/07/2013  . Depression 01/07/2013  . Restless legs syndrome 01/07/2013  . Chronic kidney disease, stage 3 unspecified ([place]) 12/20/2011  . CAD (coronary artery disease) 11/03/2010  . Hypertension 11/03/2010  . PAD (peripheral artery disease) ([place]) 11/03/2010  . SLE (systemic lupus erythematosus) ([place]) 11/03/2010    Harrel Carina, MS, OTR/L 06/23/2020, 2:43 PM  Labette Summers [facility] SERVICES [address] [place], [place], [address] Phone: [phone]   Fax:  [phone]  Name: Kenneth Summers MRN: [id number] Date  of Birth: [birth year]

## 2020-06-24 ENCOUNTER — Encounter: Payer: Medicare Other | Admitting: Occupational Therapy

## 2020-06-24 ENCOUNTER — Ambulatory Visit: Payer: Medicare Other

## 2020-06-28 ENCOUNTER — Other Ambulatory Visit: Payer: Self-pay

## 2020-06-28 ENCOUNTER — Ambulatory Visit: Payer: Medicare Other | Admitting: Occupational Therapy

## 2020-06-28 ENCOUNTER — Ambulatory Visit: Payer: Medicare Other

## 2020-06-28 ENCOUNTER — Encounter: Payer: Self-pay | Admitting: Occupational Therapy

## 2020-06-28 DIAGNOSIS — R262 Difficulty in walking, not elsewhere classified: Secondary | ICD-10-CM

## 2020-06-28 DIAGNOSIS — R269 Unspecified abnormalities of gait and mobility: Secondary | ICD-10-CM | POA: Diagnosis not present

## 2020-06-28 DIAGNOSIS — R278 Other lack of coordination: Secondary | ICD-10-CM

## 2020-06-28 DIAGNOSIS — M6281 Muscle weakness (generalized): Secondary | ICD-10-CM

## 2020-06-28 NOTE — Therapy (Signed)
[place] MAIN [facility] SERVICES [address] [place], [place], [address] Phone: [phone]   Fax:  [phone]  Occupational Therapy Treatment  Patient Details  Name: Kenneth Summers MRN: [id number] Date of Birth: [birth year] No data recorded  Encounter Date: 06/28/2020   OT End of Session - 06/28/20 1409    Visit Number [id number]    Number of Visits 254    Date for OT Re-Evaluation 08/16/20    Authorization Type Progress report period starting 05/31/2020    OT Start Time 1345    OT Stop Time 1430    OT Time Calculation (min) 45 min    Activity Tolerance Patient tolerated treatment well    Behavior During Therapy [place] for tasks assessed/performed           Past Medical History:  Diagnosis Date  . Actinic keratosis   . Alcohol abuse   . APS (antiphospholipid syndrome) ([place])   . Basal cell carcinoma   . BPH (benign prostatic hyperplasia)   . CAD (coronary artery disease)   . Cellulitis   . Cervical spinal stenosis   . Chronic osteomyelitis ([place])   . CKD (chronic kidney disease)   . CKD (chronic kidney disease)   . Clostridium difficile diarrhea   . Depression   . Diplopia   . Fatigue   . GERD (gastroesophageal reflux disease)   . Hyperlipemia   . Hypertension   . Hypovitaminosis D   . IBS (irritable bowel syndrome)   . 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 Insomnia   . Left lumbosacral radiculopathy   . Moderate obstructive sleep apnea   . Osteomyelitis of foot ([place])   . PAD (peripheral artery disease) ([place])   . Pruritus   . RA (rheumatoid arthritis) ([place])   . Radiculopathy   . Restless legs syndrome   . RLS (restless legs syndrome)   . SLE (systemic lupus erythematosus related syndrome) ([place])   . Squamous cell carcinoma of skin 01/27/2019   right mid lower ear helix  . Stroke ([place])   . Toxic maculopathy of both eyes     Past Surgical History:  Procedure Laterality Date  . CARDIAC CATHETERIZATION    . CAROTID ENDARTERECTOMY    .  CERVICAL LAMINECTOMY    . CORONARY ANGIOPLASTY    . FRACTURE SURGERY    . fusion C5-6-7    . HERNIA REPAIR    . KNEE ARTHROSCOPY    . TONSILLECTOMY      There were no vitals filed for this visit.   Subjective Assessment - 06/28/20 1408    Subjective  Pt reports he is happy it's raining.    Patient is accompanied by: Family member    Pertinent History Pt. is a 75 y.o. 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 Pt. PMHx includes: Multiple Falls, Lupus, DJD, Renal Abscess, CVA. Pt. resides with his wife. Pt.'s wife and daughter assist with ADLs. Pt. has caregivers in  for 2 hours a day, 6 days a week. Pt. received Rehab services in acute care, at SNF for STR, and [place] [facility]. Pt. is retired from [employer] for [employer] and [employer].    Patient Stated Goals To regain use of his left UE, and do more for himself.    Currently in Pain? Yes    Pain Score 2     Pain Location Neck    Pain Orientation Posterior    Pain Type Chronic  pain    Pain Onset Today    Pain Frequency Intermittent    Pain Onset Today             Therapeutic Exercise Pt performed 2.5# dowel ex for UE strengthening secondary to weakness. 2 set x 10 reps each seated: Bilateral shoulder flexion, chest press, circular patterns, and elbow flexion/extension were performed. Improved technique when exercise is mirrored by therapist t/o reps.   Therapeutic Activity Pt worked on grasping one inch resistive cubes alternating thumb opposition to the tip of the 2nd through 5th digits. The board was positioned at a vertical angle. Pt required increased rest breaks and encouragement to complete.                    OT Education - 06/28/20 1409    Education Details strengthening, UE functioning    Person(s) Educated Patient    Methods Explanation;Demonstration;Handout    Comprehension Returned demonstration;Tactile cues required;Verbalized understanding;Need further  instruction               OT Long Term Goals - 05/31/20 1355      OT LONG TERM GOAL #1   Title Pt. will increase left shoulder flexion AROM by 10 degrees to access cabinet/shelf.    Baseline 05/31/2020: Left shoulder flexion 119 degrees. Pt. is able to reach into cabinets with more ease, however has difficulty getting close enough to them with his w/c.Pt. continues to present with limited left shoulder ROM limitng access to cabinets, and shelves.03/01/2020: Left shoulder 116 degrees with new onset of numbness radiating from the left side of the neck to the hand. Pt. continues to progress with left shoulder ROM .Shouler flexion 130, and continues to work on reaching with increasing emphasis on flexing trunk to improve functional reach.  07/21/19- shoulder flexion to 130 with effort    Time 12    Period Weeks    Status On-going    Target Date 08/16/20      OT LONG TERM GOAL #2   Title Pt. will donn a shirt with Supervision.    Baseline 05/31/2020: [place]. Pt. is assisting more with UE dressing at home. Pt. continues to require modA donning a zip down jacket with consistent cues to initiate the correct direction of the jacket.  Min-modA mahaging the zipper. MinA for donning a T-shirt with assist to roll side to side to pull the shirt down over his back. 07/21/19 continues to require min assist at times for shirt/jacket    Time 12    Status On-going    Target Date 08/16/20      OT LONG TERM GOAL #4   Title Pt. will improve left grip strength by 10# to be able to open a jar/container.    Baseline Grip strength cotitnues to be limited. Decreased left sided grip strength today secondary to a new onset of numbenss, and weakness radiating from the neck to the left hand. Pt. continues to have a harder time opening a wide mouthed jar, however is able to open a smaller jar easier.    Time 12    Period Weeks    Status On-going    Target Date 08/16/20      OT LONG TERM GOAL #5   Title Pt. will improve  left hand [facility] skills to be able to assist with buttoning/zipping.    Baseline 05/24/2020: Pt. is improving with fastening small buttons. Pt. continues to work on improving [facility] skills  to improve buttoning ,  and zipping.03/01/2020: Pt.has had a new onset of weakness, and numbness in the left hand beginning upon waking up this morning which has resulted in a significant change in [facility] skills.  Pt. has improved overall donning a jacket, and  manipulating the zipper on his jacket, however continues to require practice depending on the type of zipper jacket. Pt. continues to work on improving bilateral [facility] skills for buttoning/zipping.  07/21/19 improving with buttons and zippers.    Time 12    Period Weeks    Status On-going    Target Date 08/16/20      OT LONG TERM GOAL #6   Title Pt. will demonstrate visual conmpensatory strategies for 100% of the time during ADLs    Baseline Improving, however Pt. continues to require intermittent cues cues for left sides awareness.    Time 12    Period Weeks    Status On-going    Target Date 08/16/20      OT LONG TERM GOAL #8   Title Pt. will accurately identify potential safety hazard using good safety awareness, and judgement 100% for ADLs, and IADLs.    Baseline Pt. continues to require cues    Time 12    Period Weeks    Status On-going    Target Date 08/16/20      OT LONG TERM GOAL  #9   TITLE Pt. will  navigate his w/c around obstacles with Supervision and 100% accuracy    Baseline Pt. has progressed, however, continues to require cuing for obstacles on the left with fewer  cues overall. Pt. requires significant cues to engage his left UE during tasks. 07/21/2019 continues to improve with decreased assist at times    Time 12    Period Weeks    Status On-going    Target Date 08/16/20      OT LONG TERM GOAL  #10   TITLE Pt. will independently be able  to reach to place items into cabinetry, and closets.    Baseline Pt. continues to have difficulty with  functional reaching continues to be limited.    Time 12    Period Weeks    Status On-going    Target Date 08/16/20      OT LONG TERM GOAL  #11   TITLE Pt. will increase BUE strength by 71mm grades in preparation for ADL transfers.    Baseline 05/31/2020: Pt. is progressing, however BUE strength continues to be limited. Pt. is able to tolerate increased resistive weights. Pt. continues to work towards improving overall strength for ADLs.    Time 12    Period Weeks    Status On-going    Target Date 08/16/20      OT LONG TERM GOAL  #12   TITLE Pt. will improve LUE functional reaching with the left engaging the trunk 100% of the time during ADL tasks with minimal cues.    Baseline Left sided numbness has improved. New onset of left sided numbness, and weakness is affecting his ability to perform reaching.    Time 12    Period Weeks    Status On-going    Target Date 08/16/20                 Plan - 06/28/20 1409    Clinical Impression Statement Pt. continues to make steady progress with BUE strength, and tolerance for there. ex. Pt. required frequent verbal cues, and cues for visual demonstration for movement patterns, however required cues for visual  demonstration.  Pt. required fewer cues, and less assist was required maintain upright midline sitting while working out on the UBE. Pt removed 1 inch resisitive cubes from vertical velcro board utilizing tip to tip pinch. Pt. continues to work on improving BUE strength, and [facility] skills in order to improve LUE functioning during ADLs, IADLs, and tranfers.    OT Occupational Profile and History Problem Focused Assessment - Including review of records relating to presenting problem    Occupational performance deficits (Please refer to evaluation for details): ADL's    Body Structure / Function / Physical Skills UE functional use;Coordination;FMC;Dexterity;Strength;ROM    Cognitive Skills Attention;Memory;Emotional;Problem Solve;Safety Awareness     Rehab Potential Good    Clinical Decision Making Several treatment options, min-mod task modification necessary    Comorbidities Affecting Occupational Performance: Presence of comorbidities impacting occupational performance    Comorbidities impacting occupational performance description: Phyical, cognitive, visual,  medical comorbidities    Modification or Assistance to Complete Evaluation  No modification of tasks or assist necessary to complete eval    OT Frequency 2x / week    OT Duration 12 weeks    OT Treatment/Interventions Self-care/ADL training;DME and/or AE instruction;Therapeutic exercise;Therapeutic activities;Moist Heat;Cognitive remediation/compensation;Neuromuscular education;Visual/perceptual remediation/compensation;Coping strategies training;Patient/family education;Passive range of motion;Psychosocial skills training;Energy conservation;Functional Mobility Training    Plan Continue with current OT plan of care.    Consulted and Agree with Plan of Care Patient;Family member/caregiver    Family Member Consulted Caregiver [place]           Patient will benefit from skilled therapeutic intervention in order to improve the following deficits and impairments:   Body Structure / Function / Physical Skills: UE functional use,Coordination,FMC,Dexterity,Strength,ROM Cognitive Skills: Attention,Memory,Emotional,Problem Solve,Safety Awareness     Visit Diagnosis: Other lack of coordination  Muscle weakness (generalized)    Problem List Patient Active Problem List   Diagnosis Date Noted  . Sepsis ([place]) 10/18/2018  . Long term (current) use of anticoagulants 05/01/2018  . History of stroke 04/11/2018  . Hyperlipidemia 07/04/2017  . Chronic diarrhea 06/12/2017  . 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 Fatigue 02/06/2015  . Visual changes 06/19/2013  . Benign prostatic  hyperplasia 01/07/2013  . Depression 01/07/2013  . Restless legs syndrome 01/07/2013  . Chronic kidney disease, stage 3 unspecified ([place]) 12/20/2011  . CAD (coronary artery disease) 11/03/2010  . Hypertension 11/03/2010  . PAD (peripheral artery disease) ([place]) 11/03/2010  . SLE (systemic lupus erythematosus) ([place]) 11/03/2010    Dessie Coma, M.S. OTR/L  06/28/20, 2:55 PM  ascom [phone]  [place] MAIN [facility] SERVICES [address] [place], [place], [address] Phone: [phone]   Fax:  [phone]  Name: Kenneth Summers MRN: [id number] Date of Birth: 1945/05/18

## 2020-06-28 NOTE — Therapy (Signed)
[place] MAIN [facility] SERVICES [address] [place], [place], [address] Phone: [phone]   Fax:  [phone]  Physical Therapy Treatment  Patient Details  Name: Kenneth Summers MRN: [id number] Date of Birth: 29-Apr-1945 No data recorded  Encounter Date: 06/28/2020   PT End of Session - 06/28/20 1351    Visit Number [id number]    Number of Visits 276    Date for PT Re-Evaluation 07/12/20    Authorization Type Medicare reporting period starting 09/11/18; NO FOTO    Authorization Time Period Recert 9/89/2119-05/08/7406    PT Start Time 1300    PT Stop Time 1344    PT Time Calculation (min) 44 min    Equipment Utilized During Treatment Gait belt    Activity Tolerance No increased pain;Patient limited by fatigue;Patient tolerated treatment well    Behavior During Therapy [facility] for tasks assessed/performed           Past Medical History:  Diagnosis Date  . Actinic keratosis   . Alcohol abuse   . APS (antiphospholipid syndrome) ([place])   . Basal cell carcinoma   . BPH (benign prostatic hyperplasia)   . CAD (coronary artery disease)   . Cellulitis   . Cervical spinal stenosis   . Chronic osteomyelitis ([place])   . CKD (chronic kidney disease)   . CKD (chronic kidney disease)   . Clostridium difficile diarrhea   . Depression   . Diplopia   . Fatigue   . GERD (gastroesophageal reflux disease)   . Hyperlipemia   . Hypertension   . Hypovitaminosis D   . IBS (irritable bowel syndrome)   . 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 Insomnia   . Left lumbosacral radiculopathy   . Moderate obstructive sleep apnea   . Osteomyelitis of foot ([place])   . PAD (peripheral artery disease) ([place])   . Pruritus   . RA (rheumatoid arthritis) ([place])   . Radiculopathy   . Restless legs syndrome   . RLS (restless legs syndrome)   . SLE (systemic lupus erythematosus related syndrome) ([place])   . Squamous cell carcinoma of skin 01/27/2019   right mid lower ear helix  . Stroke ([place])    . Toxic maculopathy of both eyes     Past Surgical History:  Procedure Laterality Date  . CARDIAC CATHETERIZATION    . CAROTID ENDARTERECTOMY    . CERVICAL LAMINECTOMY    . CORONARY ANGIOPLASTY    . FRACTURE SURGERY    . fusion C5-6-7    . HERNIA REPAIR    . KNEE ARTHROSCOPY    . TONSILLECTOMY      There were no vitals filed for this visit.   Subjective Assessment - 06/28/20 1304    Subjective Patient reports no new complaints today- states he rested most of the weekend    Patient is accompained by: Family member    Pertinent History Patient is a 75 y.o. male who presents to outpatient physical therapy with a referral for medical diagnosis of CVA. This patient's chief complaints consist of left hemiplegia and overall deconditioning leading to the following functional deficits: dependent for ADLs, IADLs, unable to transfer to car, dependent transfers at home with hoyer lift, unable to walk or stand without significant assistance, difficulty with W/C navigation..    Limitations Sitting;Lifting;Standing;House hold activities;Writing;Walking;Other (comment)    Diagnostic tests Brain MRI 11/01/2017: IMPRESSION: 1. Acute right ACA territory infarct as demonstrated on prior CT imaging. No intracranial hemorrhage or significant mass effect.  2. 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    Patient Stated Goals wants to be able to walk again    Currently in Pain? No/denies    Pain Onset Today    Pain Onset Today             Therapeutic exercises:  Seated knee ext/flex - AAROM on left and AROM on left (2 sets of 10 reps) Seated hip march- AAROM on left and AROM on Right (2 sets of 10 reps)  Seated thoracic rotation (left to right) - AAROM to twist each direction x 20 reps.  Lateral side bending to right to stretch out left flank- with assistance- 4 reps with 20 sec hold. UE ball raise (green theraball) attempting to focus on trunk ext and UE shoulde  flex- Patient performed 4 reps but fatigued out very quickly and unable to raise the ball overhead.     Neuromuscular re-education:  Focusing on midline (sitting in chair) with mirror placed in front of patient for biofeedback - Patient exhibited poor ability to position and maintain mid- line- Requred mod A and use of mirror to correct.  Attempted sit to stand x 2 trials - long enough to position patient back further in his chair.  Patient performed dynamic UE reaching diagonal attempting to  Maintain upright posture- Patient unable today without min assist.  Education provided throughout session via VC/TC and demonstration to facilitate movement at target joints and correct muscle activation for all testing and exercises performed.   Clinical Impression: Patient exhibited increased overall difficulty with static seated posture and increased left LE muscle weakness versus previous sessions. Patient easily distracted and more lethargic throughout treatment requiring more assistance and cues today. Not safe to try to stand due to increased Left LE weakness overall today. He will continue to benefit from skilled PT services to improve his functional strength and postural control for improved transfers/standing endurance                        PT Education - 06/28/20 1350    Education Details Seated postural education.    Person(s) Educated Patient    Methods Explanation;Demonstration;Tactile cues;Verbal cues    Comprehension Verbalized understanding;Returned demonstration;Verbal cues required;Need further instruction;Tactile cues required            PT Short Term Goals - 04/19/20 2317      PT SHORT TERM GOAL #1   Title Be independent with initial home exercise program for self-management of symptoms.    Baseline HEP to be given at second session; advice to practice unsupported sitting at home (06/19/2018); 07/08/18: Performing at home, 6/29: needs assistance but is doing  exercise program regularly; 08/22/18: adherent;    Time 2    Period Weeks    Status Achieved    Target Date 06/26/18      PT SHORT TERM GOAL #2   Title Patient will perform sit to stand transfer with mod Assist in parallel bars for improved transfer ability with decreased caregiver assist.    Baseline 04/19/2020- Max assist of 1 person    Time 6    Period Weeks    Status New    Target Date 05/31/20             PT Long Term Goals - 04/19/20 1321      PT LONG TERM GOAL #1   Title Patient will complete all bed mobility with min A to  improve functional independence for getting in and out of bed and adjusting in bed.     Baseline max A - total A reported by family (06/19/2018); 07/08/18: still requires maxA, dtr reports improvement in rolling since starting therapy; 6/23: min A to supervision in clinic; not doing at home right; 8/5: Pt's daughter states that his rolling has improved, but still has difficulty with all other bed mobility; 10/23/18: pts daughter states that she is doing about 35% of the work if he is in proper position, he does uses the bed rails to help roll;  using Harrel Lemon for coming to sit EOB; 10/12: pts daughter states that she is doing about 30% of the work if he is in proper position, he does uses the bed rails to help roll, but it is improving;  using Harrel Lemon for coming to sit EOB, 10/29: min A for sit to sidelying, rolling supervision, able to transition sidelying to sit with HHA; 12/21: min-mod A. 03/01/2020- Patient presents improving bed mobility with current ability to roll to left with CGA and verbal and tactile cues and min assist to roll to right as seen by recent visits. 03/31/2020- Patient and caregiver report that he has maintained ability to roll side to side with only minimal assistance.    Time 12    Period Weeks    Status Achieved      PT LONG TERM GOAL #2   Title Patient will complete sit <> stand transfer chair to chair with Min A and LRAD to improve functional  independence for household and community mobility.     Baseline min A for STS, still requires elevated surface height to 27".  03/01/20- Patient currently unable to perform a chair to chair transfer and continues to be transferred using hoyer lift. Patient has been training with sit to stand at parallel bars and able to currently perform sit to stand with max assist  from 27in. high surface.03/31/2020- Patient remains at [facility] assist with sit to stand transfer and continued use of hoyer lift transfer for all chair to chair or bed to chair transfers. 04/19/2020- Patient requires Max assist with sit to stand transfers and requires hoyer for chair to chair transfers.    Time 12    Period Weeks    Status On-going    Target Date 07/12/20      PT LONG TERM GOAL #3   Title Patient will navigate power w/c with min A x 100 feet to improve mobility for household and short community distances.    Baseline required total A (06/12/2018); able to roll 20 feet with min A (06/20/2018); 07/08/18: Pt can go approximately 10' without assist per family;  10/23/2018: pt's daughter states that his power WC mobility has become 100% better with the new hand control, slowed speed, and with practice; 10/12: pt's daughter reports that he can perform household WC mobility and limited community ambulation with minA    Time 12    Period Weeks    Status Achieved      PT LONG TERM GOAL #4   Title Patient will complete car transfers W/C to family SUV with min A using LRAD to improve his abilty to participate in community activities.     Baseline unable to complete car transfers at all (06/12/2018); unable to complete car transfers at all (06/19/2018).; 07/08/18: Unable to perform at this time; 09/11/18: unable to perform at this time; 10/23/2018: unable to complete car transfers at all; 10/12:  unable to complete car  transfers at all, 10/29: unable; 01/13/19: unable; 2/11: min-mod A +2, 3/10: min A+1, 12/21: mod A. 03/01/2020- Patient unable to  complete car transfers and continues to require medical transportation at this time. 3/14- Patient reports he may be purchasing a conversion van later this week.    Time 12    Period Weeks    Status Deferred      PT LONG TERM GOAL #5   Title Patient will ambulate at least 10 feet with min A using LRAD to improve mobility for household and community distances.    Baseline unable to take steps (06/12/2018); able to shift R leg forward and back with max A and RW at edge of plinth (06/19/2018); 07/08/18: unable to ambulate at this time. 09/11/18: unable to perform at this time; 10/23/2018: unable to perform at this time; 10/12: unable to perform at this time, 10/29: unable at this time; 01/13/19: unable at this time, 2/11: min A to step with RLE, unable to step with LLE, 3/10: able to take 2 steps in parallel bars with min A for safety; 4/1: min A for walking 10 feet in parallel bars, 5/24: min A walking 10 feet in parallel bars; 6/21: no change 7/14: no change. 03/01/2020- Patient unable to ambulate and currently focusing on static standing. He was able to take a forward step with his right LE last week but unable today. 03/31/2020- Patient continues to be non-ambulatory but has been able to move right LE forward with standing but unable to take a step with left LE. 04/19/2020- Goal is still not appropriate as of yet- cont to focus on static and dynamic standing balance/pregait activities.    Time 12    Period Weeks    Status Deferred    Target Date 07/12/20      PT LONG TERM GOAL #6   Title Patient will be able to safety navigate up/down ramp with power chair, exhibiting good safety awareness, mod I to safely enter/exit his home.    Baseline 2/11: supervision with intermittent min A;4/1: supervision; 5/24: supervision. 03/01/2020- patient reports that he is able to navigate down the ramp at home to leave the home but continues to need assist to navigate up the ramp into the home.    Time 12    Period Weeks     Status Partially Met      PT LONG TERM GOAL #7   Title Patient will increase functional reach test to >15 inches in sitting to exhibit improved sitting balance and positioning and reduce fall risk;    Baseline 07/30/18: 12 inches; 09/11/18: 10-12 inches; 10/23/18: 12-13 inch; 10/12: 14.5", 10/29: 15.5 inch    Time 4    Period Weeks    Status Achieved      PT LONG TERM GOAL #8   Title Patient will increase LLE quad strength to 3+/5 to improve functional strength for standing and mobility;    Baseline 10/28: 2+/5; 01/13/19: 2+/5, 2/11: 3-/5, 04/16/19: 3-/5, 05/08/19: 3-/5, 5/24: 3-/5, 6/21: 3-/5, 7/14: 3-/5, 12/21: 3-/5. 03/01/2020- Left quad= 3-/5 (focusing on quad strengthening during visits and with home program). 03/31/2020- left quad = 3-/5 with MMT. 04/19/2020= 3- /5 with left quad strength with manual muscle testing.    Time 12    Period Weeks    Status On-going    Target Date 07/12/20      PT LONG TERM GOAL  #9   TITLE Patient will report no vertigo with provoking motions or positions in order  to be able to perform ADLs and mobility tasks without symptoms.    Baseline 06/30/19: reports no dizziness in last week; 6/21: one episode this weekend; 7/14: experiencing one a week; 03/01/2020.- Patient has denied any dizziness for over past 3 weeks.    Time 12    Period Weeks    Status Achieved      PT LONG TERM GOAL  #10   TITLE Patient will demonstrate improved functional LE/Postural strength as seen by ability to perform static standing for 2 min or greater with max assist  to assist with strength required for ADL's and transfers/pregait/gait activities.    Baseline 03/01/2020- Patient able to stand approx 40 sec today with max assist in parallel bars. 03/31/2020- Patient able to demo static stand with max A for 10mn 47 sec today. 04/19/2020- Patient able to demo 2 min today with mod/max assist - yet remains inconsistent.    Time 8    Period Weeks    Status Partially Met    Target Date 07/12/20                  Plan - 06/28/20 1353    Clinical Impression Statement Patient exhibited increased overall difficulty with static seated posture and increased left LE muscle weakness versus previous sessions. Patient easily distracted and more lethargic throughout treatment requiring more assistance and cues today. Not safe to try to stand due to increased Left LE weakness overall today. He will continue to benefit from skilled PT services to improve his functional strength and postural control for improved transfers/standing endurance    Personal Factors and Comorbidities Age;Comorbidity 3+    Comorbidities Relevant past medical history and comorbidities include long term steroid use for lupus, CKD, chronic osteomyelitis, cervical spine stenosis, BPH, APS, alcohol abuse, peripheral artery disease, depression, GERD, obstructive sleep apnea, HTN, IBS, IgA deficiency, lumbar radiculopathy, RA, Stroke, toxic maculopathy in both eyes, systemic lupus erythematosus related syndrome, cardiac catheterization, carotid endarterectomy, cervical laminectomy, coronary angioplasty, Fusion C5-C7, knee arthroplasty, lumbar surgery.    Examination-Activity Limitations Bed Mobility;Bend;Sit;Toileting;Stand;Stairs;Lift;Transfers;Squat;Locomotion Level;Carry;Dressing;Hygiene/Grooming;Continence    Examination-Participation Restrictions Yard Work;Interpersonal Relationship;Community Activity    Stability/Clinical Decision Making Evolving/Moderate complexity    Rehab Potential Fair    PT Frequency 2x / week    PT Duration 12 weeks    PT Treatment/Interventions ADLs/Self Care Home Management;Electrical Stimulation;Moist Heat;Cryotherapy;Gait training;Stair training;Functional mobility training;Therapeutic exercise;Balance training;Neuromuscular re-education;Cognitive remediation;Patient/family education;Orthotic Fit/Training;Wheelchair mobility training;Manual techniques;Passive range of motion;Energy conservation;Joint  Manipulations;Canalith Repostioning;Therapeutic activities;Vestibular    PT Next Visit Plan Continue with progressive postural/LE strengthening/pregait activities    PT Home Exercise Plan no changes    Consulted and Agree with Plan of Care Patient;Family member/caregiver           Patient will benefit from skilled therapeutic intervention in order to improve the following deficits and impairments:  Abnormal gait,Decreased activity tolerance,Decreased cognition,Decreased endurance,Decreased knowledge of use of DME,Decreased range of motion,Decreased skin integrity,Decreased strength,Impaired perceived functional ability,Impaired sensation,Impaired UE functional use,Improper body mechanics,Pain,Cardiopulmonary status limiting activity,Decreased balance,Decreased coordination,Decreased mobility,Difficulty walking,Impaired tone,Postural dysfunction,Dizziness  Visit Diagnosis: Abnormality of gait and mobility  Difficulty in walking, not elsewhere classified  Muscle weakness (generalized)  Other lack of coordination     Problem List Patient Active Problem List   Diagnosis Date Noted  . Sepsis ([place] 10/18/2018  . Long term (current) use of anticoagulants 05/01/2018  . History of stroke 04/11/2018  . Hyperlipidemia 07/04/2017  . Chronic diarrhea 06/12/2017  . 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) 04/27/2016  . 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 Fatigue 02/06/2015  . Visual changes 06/19/2013  . Benign prostatic hyperplasia 01/07/2013  . Depression 01/07/2013  . Restless legs syndrome 01/07/2013  . Chronic kidney disease, stage 3 unspecified ([place]) 12/20/2011  . CAD (coronary artery disease) 11/03/2010  . Hypertension 11/03/2010  . PAD (peripheral artery disease) ([place]) 11/03/2010  . SLE (systemic lupus erythematosus) ([place]) 11/03/2010    Lewis Moccasin, PT 06/28/2020, 2:11 PM  [place]  MAIN [facility] SERVICES [address] [place], [place], [address] Phone: [phone]   Fax:  [phone]  Name: Kenneth Summers MRN: [id number] Date of Birth: November 25, 1945

## 2020-06-30 ENCOUNTER — Ambulatory Visit: Payer: Medicare Other | Admitting: Occupational Therapy

## 2020-06-30 ENCOUNTER — Ambulatory Visit: Payer: Medicare Other

## 2020-06-30 ENCOUNTER — Encounter: Payer: Self-pay | Admitting: Occupational Therapy

## 2020-06-30 ENCOUNTER — Other Ambulatory Visit: Payer: Self-pay

## 2020-06-30 DIAGNOSIS — R262 Difficulty in walking, not elsewhere classified: Secondary | ICD-10-CM

## 2020-06-30 DIAGNOSIS — R269 Unspecified abnormalities of gait and mobility: Secondary | ICD-10-CM

## 2020-06-30 DIAGNOSIS — R278 Other lack of coordination: Secondary | ICD-10-CM

## 2020-06-30 DIAGNOSIS — M6281 Muscle weakness (generalized): Secondary | ICD-10-CM

## 2020-06-30 NOTE — Therapy (Signed)
[place] MAIN [facility] SERVICES [address] [place], [place], [address] Phone: [phone]   Fax:  [phone]  Physical Therapy Treatment  Patient Details  Name: ADEL BURCH MRN: [id number] Date of Birth: 06-05-1945 No data recorded  Encounter Date: 06/30/2020   PT End of Session - 06/30/20 1621    Visit Number [id number]    Number of Visits 276    Date for PT Re-Evaluation 07/12/20    Authorization Type Medicare reporting period starting 09/11/18; NO FOTO    Authorization Time Period Recert 9/89/2119-05/08/7406    PT Start Time 1300    PT Stop Time 1343    PT Time Calculation (min) 43 min    Equipment Utilized During Treatment Gait belt    Activity Tolerance No increased pain;Patient limited by fatigue;Patient tolerated treatment well    Behavior During Therapy [facility] for tasks assessed/performed           Past Medical History:  Diagnosis Date  . Actinic keratosis   . Alcohol abuse   . APS (antiphospholipid syndrome) ([place])   . Basal cell carcinoma   . BPH (benign prostatic hyperplasia)   . CAD (coronary artery disease)   . Cellulitis   . Cervical spinal stenosis   . Chronic osteomyelitis ([place])   . CKD (chronic kidney disease)   . CKD (chronic kidney disease)   . Clostridium difficile diarrhea   . Depression   . Diplopia   . Fatigue   . GERD (gastroesophageal reflux disease)   . Hyperlipemia   . Hypertension   . Hypovitaminosis D   . IBS (irritable bowel syndrome)   . 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 Insomnia   . Left lumbosacral radiculopathy   . Moderate obstructive sleep apnea   . Osteomyelitis of foot ([place])   . PAD (peripheral artery disease) ([place])   . Pruritus   . RA (rheumatoid arthritis) ([place])   . Radiculopathy   . Restless legs syndrome   . RLS (restless legs syndrome)   . SLE (systemic lupus erythematosus related syndrome) ([place])   . Squamous cell carcinoma of skin 01/27/2019   right mid lower ear helix  . Stroke ([place])    . Toxic maculopathy of both eyes     Past Surgical History:  Procedure Laterality Date  . CARDIAC CATHETERIZATION    . CAROTID ENDARTERECTOMY    . CERVICAL LAMINECTOMY    . CORONARY ANGIOPLASTY    . FRACTURE SURGERY    . fusion C5-6-7    . HERNIA REPAIR    . KNEE ARTHROSCOPY    . TONSILLECTOMY      There were no vitals filed for this visit.   Subjective Assessment - 06/30/20 1618    Subjective Patient reports feeling better today than he did on Wednesday.    Patient is accompained by: Family member    Pertinent History Patient is a 75 y.o. male who presents to outpatient physical therapy with a referral for medical diagnosis of CVA. This patient's chief complaints consist of left hemiplegia and overall deconditioning leading to the following functional deficits: dependent for ADLs, IADLs, unable to transfer to car, dependent transfers at home with hoyer lift, unable to walk or stand without significant assistance, difficulty with W/C navigation..    Limitations Sitting;Lifting;Standing;House hold activities;Writing;Walking;Other (comment)    Diagnostic tests Brain MRI 11/01/2017: IMPRESSION: 1. Acute right ACA territory infarct as demonstrated on prior CT imaging. No intracranial hemorrhage or significant mass effect. 2. 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    Patient Stated Goals wants to be able to walk again    Currently in Pain? No/denies    Pain Onset Today    Pain Onset Today             Interventions:  Squat pivot transfer training: Instruction to patient and caregiver, Crystal on placement/position of chair for safe transfer.  Patient performed SPT with use of gait belt and max A +1 today- holding onto PT arms- able to pivot without significant difficulty from liftchair to mat table then later in session back to lift chair.   Patient performed seated postural activities and able sit at edge of mat for 35 min during session-  intermittent assist with erect sitting from Caregiver and some by PT but majority he was abel to hold himself up. He performed active scap retraction/chest push with PVC pipe x 20 reps.   Sit to stand with SW positioned in front of him- Max A +2 to stand x 6 trials but patient unable to stand > 30 sec and unable to extend hips or knees today.   Clinical Impression: Patient limited with standing and unable to stand erect for any length of time using walker today. He was able squat pivot transfer with max assist and did much better with overall sitting posture vs. Earlier this week with less listing to left overall today. He will continue to benefit from skilled PT services to improve his functional strength and postural control for improved transfers/standing endurance                         PT Education - 06/30/20 1620    Education Details Seated postural education; transfer safety cues.    Person(s) Educated Patient    Methods Explanation;Demonstration;Tactile cues;Verbal cues    Comprehension Verbalized understanding;Returned demonstration;Verbal cues required;Tactile cues required;Need further instruction            PT Short Term Goals - 04/19/20 2317      PT SHORT TERM GOAL #1   Title Be independent with initial home exercise program for self-management of symptoms.    Baseline HEP to be given at second session; advice to practice unsupported sitting at home (06/19/2018); 07/08/18: Performing at home, 6/29: needs assistance but is doing exercise program regularly; 08/22/18: adherent;    Time 2    Period Weeks    Status Achieved    Target Date 06/26/18      PT SHORT TERM GOAL #2   Title Patient will perform sit to stand transfer with mod Assist in parallel bars for improved transfer ability with decreased caregiver assist.    Baseline 04/19/2020- Max assist of 1 person    Time 6    Period Weeks    Status New    Target Date 05/31/20             PT Long Term  Goals - 04/19/20 1321      PT LONG TERM GOAL #1   Title Patient will complete all bed mobility with min A to improve functional independence for getting in and out of bed and adjusting in bed.     Baseline max A - total A reported by family (06/19/2018); 07/08/18: still requires maxA, dtr reports improvement in rolling since starting therapy; 6/23: min A to supervision in clinic; not doing at home right; 8/5: Pt's daughter states that his rolling has improved, but  still has difficulty with all other bed mobility; 10/23/18: pts daughter states that she is doing about 35% of the work if he is in proper position, he does uses the bed rails to help roll;  using Harrel Lemon for coming to sit EOB; 10/12: pts daughter states that she is doing about 30% of the work if he is in proper position, he does uses the bed rails to help roll, but it is improving;  using Harrel Lemon for coming to sit EOB, 10/29: min A for sit to sidelying, rolling supervision, able to transition sidelying to sit with HHA; 12/21: min-mod A. 03/01/2020- Patient presents improving bed mobility with current ability to roll to left with CGA and verbal and tactile cues and min assist to roll to right as seen by recent visits. 03/31/2020- Patient and caregiver report that he has maintained ability to roll side to side with only minimal assistance.    Time 12    Period Weeks    Status Achieved      PT LONG TERM GOAL #2   Title Patient will complete sit <> stand transfer chair to chair with Min A and LRAD to improve functional independence for household and community mobility.     Baseline min A for STS, still requires elevated surface height to 27".  03/01/20- Patient currently unable to perform a chair to chair transfer and continues to be transferred using hoyer lift. Patient has been training with sit to stand at parallel bars and able to currently perform sit to stand with max assist  from 27in. high surface.03/31/2020- Patient remains at [facility] assist with sit to  stand transfer and continued use of hoyer lift transfer for all chair to chair or bed to chair transfers. 04/19/2020- Patient requires Max assist with sit to stand transfers and requires hoyer for chair to chair transfers.    Time 12    Period Weeks    Status On-going    Target Date 07/12/20      PT LONG TERM GOAL #3   Title Patient will navigate power w/c with min A x 100 feet to improve mobility for household and short community distances.    Baseline required total A (06/12/2018); able to roll 20 feet with min A (06/20/2018); 07/08/18: Pt can go approximately 10' without assist per family;  10/23/2018: pt's daughter states that his power WC mobility has become 100% better with the new hand control, slowed speed, and with practice; 10/12: pt's daughter reports that he can perform household WC mobility and limited community ambulation with minA    Time 12    Period Weeks    Status Achieved      PT LONG TERM GOAL #4   Title Patient will complete car transfers W/C to family SUV with min A using LRAD to improve his abilty to participate in community activities.     Baseline unable to complete car transfers at all (06/12/2018); unable to complete car transfers at all (06/19/2018).; 07/08/18: Unable to perform at this time; 09/11/18: unable to perform at this time; 10/23/2018: unable to complete car transfers at all; 10/12:  unable to complete car transfers at all, 10/29: unable; 01/13/19: unable; 2/11: min-mod A +2, 3/10: min A+1, 12/21: mod A. 03/01/2020- Patient unable to complete car transfers and continues to require medical transportation at this time. 3/14- Patient reports he may be purchasing a conversion van later this week.    Time 12    Period Weeks    Status Deferred  PT LONG TERM GOAL #5   Title Patient will ambulate at least 10 feet with min A using LRAD to improve mobility for household and community distances.    Baseline unable to take steps (06/12/2018); able to shift R leg forward and back  with max A and RW at edge of plinth (06/19/2018); 07/08/18: unable to ambulate at this time. 09/11/18: unable to perform at this time; 10/23/2018: unable to perform at this time; 10/12: unable to perform at this time, 10/29: unable at this time; 01/13/19: unable at this time, 2/11: min A to step with RLE, unable to step with LLE, 3/10: able to take 2 steps in parallel bars with min A for safety; 4/1: min A for walking 10 feet in parallel bars, 5/24: min A walking 10 feet in parallel bars; 6/21: no change 7/14: no change. 03/01/2020- Patient unable to ambulate and currently focusing on static standing. He was able to take a forward step with his right LE last week but unable today. 03/31/2020- Patient continues to be non-ambulatory but has been able to move right LE forward with standing but unable to take a step with left LE. 04/19/2020- Goal is still not appropriate as of yet- cont to focus on static and dynamic standing balance/pregait activities.    Time 12    Period Weeks    Status Deferred    Target Date 07/12/20      PT LONG TERM GOAL #6   Title Patient will be able to safety navigate up/down ramp with power chair, exhibiting good safety awareness, mod I to safely enter/exit his home.    Baseline 2/11: supervision with intermittent min A;4/1: supervision; 5/24: supervision. 03/01/2020- patient reports that he is able to navigate down the ramp at home to leave the home but continues to need assist to navigate up the ramp into the home.    Time 12    Period Weeks    Status Partially Met      PT LONG TERM GOAL #7   Title Patient will increase functional reach test to >15 inches in sitting to exhibit improved sitting balance and positioning and reduce fall risk;    Baseline 07/30/18: 12 inches; 09/11/18: 10-12 inches; 10/23/18: 12-13 inch; 10/12: 14.5", 10/29: 15.5 inch    Time 4    Period Weeks    Status Achieved      PT LONG TERM GOAL #8   Title Patient will increase LLE quad strength to 3+/5 to improve  functional strength for standing and mobility;    Baseline 10/28: 2+/5; 01/13/19: 2+/5, 2/11: 3-/5, 04/16/19: 3-/5, 05/08/19: 3-/5, 5/24: 3-/5, 6/21: 3-/5, 7/14: 3-/5, 12/21: 3-/5. 03/01/2020- Left quad= 3-/5 (focusing on quad strengthening during visits and with home program). 03/31/2020- left quad = 3-/5 with MMT. 04/19/2020= 3- /5 with left quad strength with manual muscle testing.    Time 12    Period Weeks    Status On-going    Target Date 07/12/20      PT LONG TERM GOAL  #9   TITLE Patient will report no vertigo with provoking motions or positions in order to be able to perform ADLs and mobility tasks without symptoms.    Baseline 06/30/19: reports no dizziness in last week; 6/21: one episode this weekend; 7/14: experiencing one a week; 03/01/2020.- Patient has denied any dizziness for over past 3 weeks.    Time 12    Period Weeks    Status Achieved      PT LONG TERM  GOAL  #10   TITLE Patient will demonstrate improved functional LE/Postural strength as seen by ability to perform static standing for 2 min or greater with max assist  to assist with strength required for ADL's and transfers/pregait/gait activities.    Baseline 03/01/2020- Patient able to stand approx 40 sec today with max assist in parallel bars. 03/31/2020- Patient able to demo static stand with max A for 33min 47 sec today. 04/19/2020- Patient able to demo 2 min today with mod/max assist - yet remains inconsistent.    Time 8    Period Weeks    Status Partially Met    Target Date 07/12/20                 Plan - 06/30/20 1621    Clinical Impression Statement Patient limited with standing and unable to stand erect for any length of time using walker today. He was able squat pivot transfer with max assist and did much better with overall sitting posture vs. Earlier this week with less listing to left overall today. He will continue to benefit from skilled PT services to improve his functional strength and postural control for  improved transfers/standing endurance    Personal Factors and Comorbidities Age;Comorbidity 3+    Comorbidities Relevant past medical history and comorbidities include long term steroid use for lupus, CKD, chronic osteomyelitis, cervical spine stenosis, BPH, APS, alcohol abuse, peripheral artery disease, depression, GERD, obstructive sleep apnea, HTN, IBS, IgA deficiency, lumbar radiculopathy, RA, Stroke, toxic maculopathy in both eyes, systemic lupus erythematosus related syndrome, cardiac catheterization, carotid endarterectomy, cervical laminectomy, coronary angioplasty, Fusion C5-C7, knee arthroplasty, lumbar surgery.    Examination-Activity Limitations Bed Mobility;Bend;Sit;Toileting;Stand;Stairs;Lift;Transfers;Squat;Locomotion Level;Carry;Dressing;Hygiene/Grooming;Continence    Examination-Participation Restrictions Yard Work;Interpersonal Relationship;Community Activity    Stability/Clinical Decision Making Evolving/Moderate complexity    Rehab Potential Fair    PT Frequency 2x / week    PT Duration 12 weeks    PT Treatment/Interventions ADLs/Self Care Home Management;Electrical Stimulation;Moist Heat;Cryotherapy;Gait training;Stair training;Functional mobility training;Therapeutic exercise;Balance training;Neuromuscular re-education;Cognitive remediation;Patient/family education;Orthotic Fit/Training;Wheelchair mobility training;Manual techniques;Passive range of motion;Energy conservation;Joint Manipulations;Canalith Repostioning;Therapeutic activities;Vestibular    PT Next Visit Plan Continue with progressive postural/LE strengthening/pregait activities    PT Home Exercise Plan no changes    Consulted and Agree with Plan of Care Patient;Family member/caregiver           Patient will benefit from skilled therapeutic intervention in order to improve the following deficits and impairments:  Abnormal gait,Decreased activity tolerance,Decreased cognition,Decreased endurance,Decreased knowledge  of use of DME,Decreased range of motion,Decreased skin integrity,Decreased strength,Impaired perceived functional ability,Impaired sensation,Impaired UE functional use,Improper body mechanics,Pain,Cardiopulmonary status limiting activity,Decreased balance,Decreased coordination,Decreased mobility,Difficulty walking,Impaired tone,Postural dysfunction,Dizziness  Visit Diagnosis: Abnormality of gait and mobility  Difficulty in walking, not elsewhere classified  Muscle weakness (generalized)  Other lack of coordination     Problem List Patient Active Problem List   Diagnosis Date Noted  . Sepsis ([place]) 10/18/2018  . Long term (current) use of anticoagulants 05/01/2018  . History of stroke 04/11/2018  . Hyperlipidemia 07/04/2017  . Chronic diarrhea 06/12/2017  . 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 Fatigue 02/06/2015  . Visual changes 06/19/2013  . Benign prostatic hyperplasia 01/07/2013  . Depression 01/07/2013  . Restless legs syndrome 01/07/2013  . Chronic kidney disease, stage 3 unspecified ([place]) 12/20/2011  . CAD (coronary artery disease) 11/03/2010  . Hypertension 11/03/2010  . PAD (peripheral artery disease) ([place]) 11/03/2010  . SLE (  systemic lupus erythematosus) ([place]) 11/03/2010    Lewis Moccasin, PT 06/30/2020, 4:36 PM  [place] MAIN [facility] SERVICES [address] [place], [place], [address] Phone: [phone]   Fax:  [phone]  Name: HILBERT BRIGGS MRN: [id number] Date of Birth: February 13, 1945

## 2020-06-30 NOTE — Therapy (Signed)
[place] MAIN [facility] SERVICES [address] [place], [place], [address] Phone: [phone]   Fax:  [phone]  Occupational Therapy Treatment  Patient Details  Name: Kenneth Summers MRN: [id number] Date of Birth: 1945-12-12 No data recorded  Encounter Date: 06/30/2020   OT End of Session - 06/30/20 1401    Visit Number [id number]    Number of Visits 254    Date for OT Re-Evaluation 08/16/20    Authorization Type Progress report period starting 05/31/2020    OT Start Time 1345    OT Stop Time 1430    OT Time Calculation (min) 45 min    Activity Tolerance Patient tolerated treatment well    Behavior During Therapy [facility] for tasks assessed/performed           Past Medical History:  Diagnosis Date  . Actinic keratosis   . Alcohol abuse   . APS (antiphospholipid syndrome) ([place])   . Basal cell carcinoma   . BPH (benign prostatic hyperplasia)   . CAD (coronary artery disease)   . Cellulitis   . Cervical spinal stenosis   . Chronic osteomyelitis ([place])   . CKD (chronic kidney disease)   . CKD (chronic kidney disease)   . Clostridium difficile diarrhea   . Depression   . Diplopia   . Fatigue   . GERD (gastroesophageal reflux disease)   . Hyperlipemia   . Hypertension   . Hypovitaminosis D   . IBS (irritable bowel syndrome)   . 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 Insomnia   . Left lumbosacral radiculopathy   . Moderate obstructive sleep apnea   . Osteomyelitis of foot ([place])   . PAD (peripheral artery disease) ([place])   . Pruritus   . RA (rheumatoid arthritis) ([place])   . Radiculopathy   . Restless legs syndrome   . RLS (restless legs syndrome)   . SLE (systemic lupus erythematosus related syndrome) ([place])   . Squamous cell carcinoma of skin 01/27/2019   right mid lower ear helix  . Stroke ([place])   . Toxic maculopathy of both eyes     Past Surgical History:  Procedure Laterality Date  . CARDIAC CATHETERIZATION    . CAROTID ENDARTERECTOMY    .  CERVICAL LAMINECTOMY    . CORONARY ANGIOPLASTY    . FRACTURE SURGERY    . fusion C5-6-7    . HERNIA REPAIR    . KNEE ARTHROSCOPY    . TONSILLECTOMY      There were no vitals filed for this visit.   Subjective Assessment - 06/30/20 1359    Subjective  Pt reports he is going to stay inside for the holiday weekend.    Patient is accompanied by: Family member    Pertinent History Pt. is a 75 y.o. 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 Pt. PMHx includes: Multiple Falls, Lupus, DJD, Renal Abscess, CVA. Pt. resides with his wife. Pt.'s wife and daughter assist with ADLs. Pt. has caregivers in  for 2 hours a day, 6 days a week. Pt. received Rehab services in acute care, at SNF for STR, and [place] services. Pt. is retired from [employer] for [employer] and [employer].    Patient Stated Goals To regain use of his left UE, and do more for himself.    Currently in Pain? No/denies    Pain Onset Today    Pain Onset Today  Therapeutic Exercise Pt performed 2# dowel ex for UE strengthening secondary to weakness. 2 set x 10 reps each seated: Bilateral shoulder flexion, chest press, circular patterns, and elbow flexion/extension were performed. Improved technique when exercise is mirrored by therapist t/o reps. Pt worked on the [place] in the forward motion for 5 min in sitting and backward for 4 min. Constant attendance was provided for hand position and cues to avoid compensation proximally.   Therapeutic Activity Pt worked on reaching using the [employer]. Pt grasped and moved the shapes through  2 vertical dowels of varying heights, required assistance to grasp shapes 2/2 increased difficulty supinating this date.           OT Education - 06/30/20 1401    Education Details strengthening, UE functioning    Person(s) Educated Patient;Caregiver(s)    Methods Explanation;Demonstration;Handout    Comprehension Returned demonstration;Tactile  cues required;Verbalized understanding;Need further instruction               OT Long Term Goals - 05/31/20 1355      OT LONG TERM GOAL #1   Title Pt. will increase left shoulder flexion AROM by 10 degrees to access cabinet/shelf.    Baseline 05/31/2020: Left shoulder flexion 119 degrees. Pt. is able to reach into cabinets with more ease, however has difficulty getting close enough to them with his w/c.Pt. continues to present with limited left shoulder ROM limitng access to cabinets, and shelves.03/01/2020: Left shoulder 116 degrees with new onset of numbness radiating from the left side of the neck to the hand. Pt. continues to progress with left shoulder ROM .Shouler flexion 130, and continues to work on reaching with increasing emphasis on flexing trunk to improve functional reach.  07/21/19- shoulder flexion to 130 with effort    Time 12    Period Weeks    Status On-going    Target Date 08/16/20      OT LONG TERM GOAL #2   Title Pt. will donn a shirt with Supervision.    Baseline 05/31/2020: [place]. Pt. is assisting more with UE dressing at home. Pt. continues to require modA donning a zip down jacket with consistent cues to initiate the correct direction of the jacket.  Min-modA mahaging the zipper. MinA for donning a T-shirt with assist to roll side to side to pull the shirt down over his back. 07/21/19 continues to require min assist at times for shirt/jacket    Time 12    Status On-going    Target Date 08/16/20      OT LONG TERM GOAL #4   Title Pt. will improve left grip strength by 10# to be able to open a jar/container.    Baseline Grip strength cotitnues to be limited. Decreased left sided grip strength today secondary to a new onset of numbenss, and weakness radiating from the neck to the left hand. Pt. continues to have a harder time opening a wide mouthed jar, however is able to open a smaller jar easier.    Time 12    Period Weeks    Status On-going    Target Date 08/16/20       OT LONG TERM GOAL #5   Title Pt. will improve left hand [facility] skills to be able to assist with buttoning/zipping.    Baseline 05/24/2020: Pt. is improving with fastening small buttons. Pt. continues to work on improving [facility] skills  to improve buttoning , and zipping.03/01/2020: Pt.has had a new onset of weakness, and numbness in  the left hand beginning upon waking up this morning which has resulted in a significant change in [facility] skills.  Pt. has improved overall donning a jacket, and  manipulating the zipper on his jacket, however continues to require practice depending on the type of zipper jacket. Pt. continues to work on improving bilateral [facility] skills for buttoning/zipping.  07/21/19 improving with buttons and zippers.    Time 12    Period Weeks    Status On-going    Target Date 08/16/20      OT LONG TERM GOAL #6   Title Pt. will demonstrate visual conmpensatory strategies for 100% of the time during ADLs    Baseline Improving, however Pt. continues to require intermittent cues cues for left sides awareness.    Time 12    Period Weeks    Status On-going    Target Date 08/16/20      OT LONG TERM GOAL #8   Title Pt. will accurately identify potential safety hazard using good safety awareness, and judgement 100% for ADLs, and IADLs.    Baseline Pt. continues to require cues    Time 12    Period Weeks    Status On-going    Target Date 08/16/20      OT LONG TERM GOAL  #9   TITLE Pt. will  navigate his w/c around obstacles with Supervision and 100% accuracy    Baseline Pt. has progressed, however, continues to require cuing for obstacles on the left with fewer  cues overall. Pt. requires significant cues to engage his left UE during tasks. 07/21/2019 continues to improve with decreased assist at times    Time 12    Period Weeks    Status On-going    Target Date 08/16/20      OT LONG TERM GOAL  #10   TITLE Pt. will independently be able  to reach to place items into cabinetry, and  closets.    Baseline Pt. continues to have difficulty with functional reaching continues to be limited.    Time 12    Period Weeks    Status On-going    Target Date 08/16/20      OT LONG TERM GOAL  #11   TITLE Pt. will increase BUE strength by 7mm grades in preparation for ADL transfers.    Baseline 05/31/2020: Pt. is progressing, however BUE strength continues to be limited. Pt. is able to tolerate increased resistive weights. Pt. continues to work towards improving overall strength for ADLs.    Time 12    Period Weeks    Status On-going    Target Date 08/16/20      OT LONG TERM GOAL  #12   TITLE Pt. will improve LUE functional reaching with the left engaging the trunk 100% of the time during ADL tasks with minimal cues.    Baseline Left sided numbness has improved. New onset of left sided numbness, and weakness is affecting his ability to perform reaching.    Time 12    Period Weeks    Status On-going    Target Date 08/16/20                 Plan - 06/30/20 1402    Clinical Impression Statement Pt. continues to make steady progress with BUE strength, and tolerance for there. ex. Pt. required frequent verbal cues, and cues for visual demonstration for movement patterns, however required cues for visual demonstration.  Pt required increased rest breaks this date and tolerated increased  resistance on UBE. Increased tremors this date limiting participation in shape tower. Pt. continues to work on improving BUE strength, and [facility] skills in order to improve LUE functioning during ADLs, IADLs, and tranfers.    OT Occupational Profile and History Problem Focused Assessment - Including review of records relating to presenting problem    Occupational performance deficits (Please refer to evaluation for details): ADL's    Body Structure / Function / Physical Skills UE functional use;Coordination;FMC;Dexterity;Strength;ROM    Cognitive Skills Attention;Memory;Emotional;Problem Solve;Safety  Awareness    Rehab Potential Good    Clinical Decision Making Several treatment options, min-mod task modification necessary    Comorbidities Affecting Occupational Performance: Presence of comorbidities impacting occupational performance    Comorbidities impacting occupational performance description: Phyical, cognitive, visual,  medical comorbidities    Modification or Assistance to Complete Evaluation  No modification of tasks or assist necessary to complete eval    OT Frequency 2x / week    OT Duration 12 weeks    OT Treatment/Interventions Self-care/ADL training;DME and/or AE instruction;Therapeutic exercise;Therapeutic activities;Moist Heat;Cognitive remediation/compensation;Neuromuscular education;Visual/perceptual remediation/compensation;Coping strategies training;Patient/family education;Passive range of motion;Psychosocial skills training;Energy conservation;Functional Mobility Training    Plan Continue with current OT plan of care.    Consulted and Agree with Plan of Care Patient;Family member/caregiver    Family Member Consulted Caregiver Kenneth Summers           Patient will benefit from skilled therapeutic intervention in order to improve the following deficits and impairments:   Body Structure / Function / Physical Skills: UE functional use,Coordination,FMC,Dexterity,Strength,ROM Cognitive Skills: Attention,Memory,Emotional,Problem Solve,Safety Awareness     Visit Diagnosis: Muscle weakness (generalized)  Other lack of coordination    Problem List Patient Active Problem List   Diagnosis Date Noted  . Sepsis ([place]) 10/18/2018  . Long term (current) use of anticoagulants 05/01/2018  . History of stroke 04/11/2018  . Hyperlipidemia 07/04/2017  . Chronic diarrhea 06/12/2017  . 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 Fatigue 02/06/2015  . Visual changes 06/19/2013  . Benign  prostatic hyperplasia 01/07/2013  . Depression 01/07/2013  . Restless legs syndrome 01/07/2013  . Chronic kidney disease, stage 3 unspecified ([place]) 12/20/2011  . CAD (coronary artery disease) 11/03/2010  . Hypertension 11/03/2010  . PAD (peripheral artery disease) ([place]) 11/03/2010  . SLE (systemic lupus erythematosus) ([place]) 11/03/2010     Dessie Coma, M.S. OTR/L  06/30/20, 2:29 PM  ascom [phone]  [place] MAIN [facility] SERVICES [address] [place], [place], [address] Phone: [phone]   Fax:  [phone]  Name: Kenneth Summers MRN: [id number] Date of Birth: Sep 05, 1945

## 2020-07-01 ENCOUNTER — Ambulatory Visit: Payer: Medicare Other

## 2020-07-01 ENCOUNTER — Other Ambulatory Visit: Payer: Self-pay | Admitting: Primary Care

## 2020-07-01 ENCOUNTER — Encounter: Payer: Medicare Other | Admitting: Occupational Therapy

## 2020-07-07 ENCOUNTER — Ambulatory Visit: Payer: Medicare Other | Admitting: Occupational Therapy

## 2020-07-07 ENCOUNTER — Ambulatory Visit: Payer: Medicare Other | Attending: Family Medicine

## 2020-07-07 ENCOUNTER — Other Ambulatory Visit: Payer: Self-pay

## 2020-07-07 DIAGNOSIS — R269 Unspecified abnormalities of gait and mobility: Secondary | ICD-10-CM

## 2020-07-07 DIAGNOSIS — R278 Other lack of coordination: Secondary | ICD-10-CM

## 2020-07-07 DIAGNOSIS — M6281 Muscle weakness (generalized): Secondary | ICD-10-CM

## 2020-07-07 DIAGNOSIS — R2689 Other abnormalities of gait and mobility: Secondary | ICD-10-CM | POA: Diagnosis present

## 2020-07-07 DIAGNOSIS — R262 Difficulty in walking, not elsewhere classified: Secondary | ICD-10-CM

## 2020-07-07 NOTE — Therapy (Signed)
[place] MAIN [facility] SERVICES [address] [place], [place], [address] Phone: [phone]   Fax:  [phone]  Occupational Therapy Treatment  Patient Details  Name: Kenneth Summers MRN: [id number] Date of Birth: 11/25/1945 No data recorded  Encounter Date: 07/07/2020   OT End of Session - 07/07/20 1611    Visit Number [id number]    Number of Visits 254    Date for OT Re-Evaluation 08/16/20    Authorization Type Progress report period starting 05/31/2020    OT Start Time 1345    OT Stop Time 1430    OT Time Calculation (min) 45 min    Activity Tolerance Patient tolerated treatment well    Behavior During Therapy [facility] for tasks assessed/performed           Past Medical History:  Diagnosis Date  . Actinic keratosis   . Alcohol abuse   . APS (antiphospholipid syndrome) ([place])   . Basal cell carcinoma   . BPH (benign prostatic hyperplasia)   . CAD (coronary artery disease)   . Cellulitis   . Cervical spinal stenosis   . Chronic osteomyelitis ([place])   . CKD (chronic kidney disease)   . CKD (chronic kidney disease)   . Clostridium difficile diarrhea   . Depression   . Diplopia   . Fatigue   . GERD (gastroesophageal reflux disease)   . Hyperlipemia   . Hypertension   . Hypovitaminosis D   . IBS (irritable bowel syndrome)   . 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 Insomnia   . Left lumbosacral radiculopathy   . Moderate obstructive sleep apnea   . Osteomyelitis of foot ([place])   . PAD (peripheral artery disease) ([place])   . Pruritus   . RA (rheumatoid arthritis) ([place])   . Radiculopathy   . Restless legs syndrome   . RLS (restless legs syndrome)   . SLE (systemic lupus erythematosus related syndrome) ([place])   . Squamous cell carcinoma of skin 01/27/2019   right mid lower ear helix  . Stroke ([place])   . Toxic maculopathy of both eyes     Past Surgical History:  Procedure Laterality Date  . CARDIAC CATHETERIZATION    . CAROTID ENDARTERECTOMY    .  CERVICAL LAMINECTOMY    . CORONARY ANGIOPLASTY    . FRACTURE SURGERY    . fusion C5-6-7    . HERNIA REPAIR    . KNEE ARTHROSCOPY    . TONSILLECTOMY      There were no vitals filed for this visit.   Subjective Assessment - 07/07/20 1611    Subjective  Pt reports he is going to stay inside for the holiday weekend.    Patient is accompanied by: Family member    Pertinent History Pt. is a 75 y.o. 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 Pt. PMHx includes: Multiple Falls, Lupus, DJD, Renal Abscess, CVA. Pt. resides with his wife. Pt.'s wife and daughter assist with ADLs. Pt. has caregivers in  for 2 hours a day, 6 days a week. Pt. received Rehab services in acute care, at SNF for STR, and [place] services. Pt. is retired from [employer] for [employer] and [employer].    Currently in Pain? No/denies          OT TREATMENT   Therapeutic Exercise:  Pt.worked on BUE strengthening, and reciprocal motion using the UBE while seated for 8 min. with no resistance. Pt. performed3#dowelfor UE strengthening  secondary to weakness.Pt.tolerated increased dowel weight.Bilateral shoulder flexion, chest press,andcircular patterns,were performedfor1-2 sets10-20reps eachwith cues to keep the dowel symmetrical, and level.Pt. worked on3# dumbbell ex. forrightelbow flexion and extension,supination/pronation, wrist extension, and radial deviation, leftelbow flexion, extension, and supination/pronation, 2# for left wristextension, and radial deviation. Pt. Worked on BUE functional reaching with the LUE in multiple planes for cones, and crossing midline to stack them.  Pt.continuestomakesteadyprogress with BUE strength, and tolerance for there.ex. Pt.requiredfrequent verbalcues, and cues for visual demonstrationfor movement patterns, however required cues for visual demonstration.Pt.required verbal, and tactile cues for theraband  exercises.Pt.continues to require consistent verbal, visual, and tactile cues.Pt.continues torequire cues for radial deviation.Pt. Presented with increased reach span in all planes. Pt. Was able to maintain upright sitting in midline. Pt. Did not require a towel roll today for lateral support. Pt.continues torequire less cuesto perform UE strengthening exercises, technique, and formas well as visual demonstration prior to the exercises.Pt. continues to work on improving BUE strength, and [facility] skills in order to improve LUE functioning during ADLs, IADLs, and tranfers.                       OT Education - 07/07/20 1611    Education Details strengthening, UE functioning    Person(s) Educated Patient;Caregiver(s)    Methods Explanation;Demonstration;Handout    Comprehension Returned demonstration;Tactile cues required;Verbalized understanding;Need further instruction               OT Long Term Goals - 05/31/20 1355      OT LONG TERM GOAL #1   Title Pt. will increase left shoulder flexion AROM by 10 degrees to access cabinet/shelf.    Baseline 05/31/2020: Left shoulder flexion 119 degrees. Pt. is able to reach into cabinets with more ease, however has difficulty getting close enough to them with his w/c.Pt. continues to present with limited left shoulder ROM limitng access to cabinets, and shelves.03/01/2020: Left shoulder 116 degrees with new onset of numbness radiating from the left side of the neck to the hand. Pt. continues to progress with left shoulder ROM .Shouler flexion 130, and continues to work on reaching with increasing emphasis on flexing trunk to improve functional reach.  07/21/19- shoulder flexion to 130 with effort    Time 12    Period Weeks    Status On-going    Target Date 08/16/20      OT LONG TERM GOAL #2   Title Pt. will donn a shirt with Supervision.    Baseline 05/31/2020: [place]. Pt. is assisting more with UE dressing at home. Pt. continues to  require modA donning a zip down jacket with consistent cues to initiate the correct direction of the jacket.  Min-modA mahaging the zipper. MinA for donning a T-shirt with assist to roll side to side to pull the shirt down over his back. 07/21/19 continues to require min assist at times for shirt/jacket    Time 12    Status On-going    Target Date 08/16/20      OT LONG TERM GOAL #4   Title Pt. will improve left grip strength by 10# to be able to open a jar/container.    Baseline Grip strength cotitnues to be limited. Decreased left sided grip strength today secondary to a new onset of numbenss, and weakness radiating from the neck to the left hand. Pt. continues to have a harder time opening a wide mouthed jar, however is able to open a smaller jar easier.    Time 12  Period Weeks    Status On-going    Target Date 08/16/20      OT LONG TERM GOAL #5   Title Pt. will improve left hand [facility] skills to be able to assist with buttoning/zipping.    Baseline 05/24/2020: Pt. is improving with fastening small buttons. Pt. continues to work on improving [facility] skills  to improve buttoning , and zipping.03/01/2020: Pt.has had a new onset of weakness, and numbness in the left hand beginning upon waking up this morning which has resulted in a significant change in [facility] skills.  Pt. has improved overall donning a jacket, and  manipulating the zipper on his jacket, however continues to require practice depending on the type of zipper jacket. Pt. continues to work on improving bilateral [facility] skills for buttoning/zipping.  07/21/19 improving with buttons and zippers.    Time 12    Period Weeks    Status On-going    Target Date 08/16/20      OT LONG TERM GOAL #6   Title Pt. will demonstrate visual conmpensatory strategies for 100% of the time during ADLs    Baseline Improving, however Pt. continues to require intermittent cues cues for left sides awareness.    Time 12    Period Weeks    Status On-going    Target  Date 08/16/20      OT LONG TERM GOAL #8   Title Pt. will accurately identify potential safety hazard using good safety awareness, and judgement 100% for ADLs, and IADLs.    Baseline Pt. continues to require cues    Time 12    Period Weeks    Status On-going    Target Date 08/16/20      OT LONG TERM GOAL  #9   TITLE Pt. will  navigate his w/c around obstacles with Supervision and 100% accuracy    Baseline Pt. has progressed, however, continues to require cuing for obstacles on the left with fewer  cues overall. Pt. requires significant cues to engage his left UE during tasks. 07/21/2019 continues to improve with decreased assist at times    Time 12    Period Weeks    Status On-going    Target Date 08/16/20      OT LONG TERM GOAL  #10   TITLE Pt. will independently be able  to reach to place items into cabinetry, and closets.    Baseline Pt. continues to have difficulty with functional reaching continues to be limited.    Time 12    Period Weeks    Status On-going    Target Date 08/16/20      OT LONG TERM GOAL  #11   TITLE Pt. will increase BUE strength by 88mm grades in preparation for ADL transfers.    Baseline 05/31/2020: Pt. is progressing, however BUE strength continues to be limited. Pt. is able to tolerate increased resistive weights. Pt. continues to work towards improving overall strength for ADLs.    Time 12    Period Weeks    Status On-going    Target Date 08/16/20      OT LONG TERM GOAL  #12   TITLE Pt. will improve LUE functional reaching with the left engaging the trunk 100% of the time during ADL tasks with minimal cues.    Baseline Left sided numbness has improved. New onset of left sided numbness, and weakness is affecting his ability to perform reaching.    Time 12    Period Weeks    Status  On-going    Target Date 08/16/20                 Plan - 07/07/20 1612    Clinical Impression Statement Pt.continuestomakesteadyprogress with BUE strength, and  tolerance for there.ex. Pt.requiredfrequent verbalcues, and cues for visual demonstrationfor movement patterns, however required cues for visual demonstration.Pt.required verbal, and tactile cues for theraband exercises.Pt.continues to require consistent verbal, visual, and tactile cues.Pt.continues torequire cues for radial deviation.Pt. Presented with increased reach span in all planes. Pt. Was able to maintain upright sitting in midline. Pt. Did not require a towel roll today for lateral support. Pt.continues torequire less cuesto perform UE strengthening exercises, technique, and formas well as visual demonstration prior to the exercises.Pt. continues to work on improving BUE strength, and [facility] skills in order to improve LUE functioning during ADLs, IADLs, and tranfers.   OT Occupational Profile and History Problem Focused Assessment - Including review of records relating to presenting problem    Occupational performance deficits (Please refer to evaluation for details): ADL's    Body Structure / Function / Physical Skills UE functional use;Coordination;FMC;Dexterity;Strength;ROM    Cognitive Skills Attention;Memory;Emotional;Problem Solve;Safety Awareness    Rehab Potential Good    Clinical Decision Making Several treatment options, min-mod task modification necessary    Comorbidities Affecting Occupational Performance: Presence of comorbidities impacting occupational performance    Comorbidities impacting occupational performance description: Phyical, cognitive, visual,  medical comorbidities    Modification or Assistance to Complete Evaluation  No modification of tasks or assist necessary to complete eval    OT Frequency 2x / week    OT Duration 12 weeks    OT Treatment/Interventions Self-care/ADL training;DME and/or AE instruction;Therapeutic exercise;Therapeutic activities;Moist Heat;Cognitive remediation/compensation;Neuromuscular education;Visual/perceptual  remediation/compensation;Coping strategies training;Patient/family education;Passive range of motion;Psychosocial skills training;Energy conservation;Functional Mobility Training    Consulted and Agree with Plan of Care Patient;Family member/caregiver           Patient will benefit from skilled therapeutic intervention in order to improve the following deficits and impairments:   Body Structure / Function / Physical Skills: UE functional use,Coordination,FMC,Dexterity,Strength,ROM Cognitive Skills: Attention,Memory,Emotional,Problem Solve,Safety Awareness     Visit Diagnosis: Muscle weakness (generalized)  Other lack of coordination    Problem List Patient Active Problem List   Diagnosis Date Noted  . Sepsis ([place]) 10/18/2018  . Long term (current) use of anticoagulants 05/01/2018  . History of stroke 04/11/2018  . Hyperlipidemia 07/04/2017  . Chronic diarrhea 06/12/2017  . 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 Fatigue 02/06/2015  . Visual changes 06/19/2013  . Benign prostatic hyperplasia 01/07/2013  . Depression 01/07/2013  . Restless legs syndrome 01/07/2013  . Chronic kidney disease, stage 3 unspecified ([place]) 12/20/2011  . CAD (coronary artery disease) 11/03/2010  . Hypertension 11/03/2010  . PAD (peripheral artery disease) ([place]) 11/03/2010  . SLE (systemic lupus erythematosus) ([place]) 11/03/2010    Harrel Carina, MS, OTR/L 07/07/2020, 4:13 PM  [place] MAIN [facility] SERVICES [address] [place], [place], [address] Phone: [phone]   Fax:  [phone]  Name: Kenneth Summers MRN: [id number] Date of Birth: 11/06/1945

## 2020-07-08 NOTE — Therapy (Signed)
[place] MAIN [facility] SERVICES [address] [place], [place], [address] Phone: [phone]   Fax:  [phone]  Physical Therapy Treatment  Patient Details  Name: Kenneth Summers MRN: [id number] Date of Birth: 02/02/1946 No data recorded  Encounter Date: 07/07/2020   PT End of Session - 07/07/20 1625    Visit Number [id number]    Number of Visits 276    Date for PT Re-Evaluation 07/12/20    Authorization Type Medicare reporting period starting 09/11/18; NO FOTO    Authorization Time Period Recert 0/51/[id number]/06/7354    PT Start Time 1300    PT Stop Time 1344    PT Time Calculation (min) 44 min    Equipment Utilized During Treatment Gait belt    Activity Tolerance No increased pain;Patient limited by fatigue;Patient tolerated treatment well    Behavior During Therapy [facility] for tasks assessed/performed           Past Medical History:  Diagnosis Date  . Actinic keratosis   . Alcohol abuse   . APS (antiphospholipid syndrome) ([place])   . Basal cell carcinoma   . BPH (benign prostatic hyperplasia)   . CAD (coronary artery disease)   . Cellulitis   . Cervical spinal stenosis   . Chronic osteomyelitis ([place])   . CKD (chronic kidney disease)   . CKD (chronic kidney disease)   . Clostridium difficile diarrhea   . Depression   . Diplopia   . Fatigue   . GERD (gastroesophageal reflux disease)   . Hyperlipemia   . Hypertension   . Hypovitaminosis D   . IBS (irritable bowel syndrome)   . 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 Insomnia   . Left lumbosacral radiculopathy   . Moderate obstructive sleep apnea   . Osteomyelitis of foot ([place])   . PAD (peripheral artery disease) ([place])   . Pruritus   . RA (rheumatoid arthritis) ([place])   . Radiculopathy   . Restless legs syndrome   . RLS (restless legs syndrome)   . SLE (systemic lupus erythematosus related syndrome) ([place])   . Squamous cell carcinoma of skin 01/27/2019   right mid lower ear helix  . Stroke ([place])    . Toxic maculopathy of both eyes     Past Surgical History:  Procedure Laterality Date  . CARDIAC CATHETERIZATION    . CAROTID ENDARTERECTOMY    . CERVICAL LAMINECTOMY    . CORONARY ANGIOPLASTY    . FRACTURE SURGERY    . fusion C5-6-7    . HERNIA REPAIR    . KNEE ARTHROSCOPY    . TONSILLECTOMY      There were no vitals filed for this visit.   Subjective Assessment - 07/07/20 1336    Subjective Patient reports doing okay today and denies any new complaints.    Patient is accompained by: Family member    Pertinent History Patient is a 75 y.o. male who presents to outpatient physical therapy with a referral for medical diagnosis of CVA. This patient's chief complaints consist of left hemiplegia and overall deconditioning leading to the following functional deficits: dependent for ADLs, IADLs, unable to transfer to car, dependent transfers at home with hoyer lift, unable to walk or stand without significant assistance, difficulty with W/C navigation..    Limitations Sitting;Lifting;Standing;House hold activities;Writing;Walking;Other (comment)    Diagnostic tests Brain MRI 11/01/2017: IMPRESSION: 1. Acute right ACA territory infarct as demonstrated on prior CT imaging. No intracranial hemorrhage or significant mass effect. 2. 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    Patient Stated Goals wants to be able to walk again    Currently in Pain? No/denies    Pain Onset Today    Pain Onset Today           Patient performed warm up of seated therex including AAROM with left knee flex/ext and hip flex/abd/add 2 sets of 10 reps and AROM on right. Patient requires VC for right LE and mod/min assist with left LE to complete.   Patient performed sit to stand and static standing in // bars today:   He required max assist of 2 people to stand today He was able to scoot out to edge of chair with min assist and no listing to left side today in sitting:   1)  33 sec - Max assist of 2 to maintain standing 2) 49 sec- Less verbal cues to stand erect- 0nly able to hold for 25 sec.  3) 1 min 04 sec- Able to improve with anterior weight shift and extend hips better.  4) 47 sec- same as 3 yet less time- stopped due to fatigue.  5) 23 sec - Patient rotated spine and was standing ackwardly on left LE 6) 33 sec  Patient continues to require constant Cues and assist to maintain posture today. He was able to stand multiple times today and did assist some but unable to stand well without extensive assist. He will continue to benefit from skilled PT services to improve his functional strength and postural control for improved transfers/standing endurance                        PT Education - 07/08/20 1337    Education Details specific exercise cues for balance with standing.    Person(s) Educated Patient    Methods Explanation;Demonstration;Tactile cues;Verbal cues    Comprehension Verbalized understanding;Returned demonstration;Verbal cues required;Tactile cues required;Need further instruction            PT Short Term Goals - 04/19/20 2317      PT SHORT TERM GOAL #1   Title Be independent with initial home exercise program for self-management of symptoms.    Baseline HEP to be given at second session; advice to practice unsupported sitting at home (06/19/2018); 07/08/18: Performing at home, 6/29: needs assistance but is doing exercise program regularly; 08/22/18: adherent;    Time 2    Period Weeks    Status Achieved    Target Date 06/26/18      PT SHORT TERM GOAL #2   Title Patient will perform sit to stand transfer with mod Assist in parallel bars for improved transfer ability with decreased caregiver assist.    Baseline 04/19/2020- Max assist of 1 person    Time 6    Period Weeks    Status New    Target Date 05/31/20             PT Long Term Goals - 04/19/20 1321      PT LONG TERM GOAL #1   Title Patient will complete  all bed mobility with min A to improve functional independence for getting in and out of bed and adjusting in bed.     Baseline max A - total A reported by family (06/19/2018); 07/08/18: still requires maxA, dtr reports improvement in rolling since starting therapy; 6/23: min A to supervision in clinic; not doing at home right; 8/5: Pt's daughter states that his  rolling has improved, but still has difficulty with all other bed mobility; 10/23/18: pts daughter states that she is doing about 35% of the work if he is in proper position, he does uses the bed rails to help roll;  using Harrel Lemon for coming to sit EOB; 10/12: pts daughter states that she is doing about 30% of the work if he is in proper position, he does uses the bed rails to help roll, but it is improving;  using Harrel Lemon for coming to sit EOB, 10/29: min A for sit to sidelying, rolling supervision, able to transition sidelying to sit with HHA; 12/21: min-mod A. 03/01/2020- Patient presents improving bed mobility with current ability to roll to left with CGA and verbal and tactile cues and min assist to roll to right as seen by recent visits. 03/31/2020- Patient and caregiver report that he has maintained ability to roll side to side with only minimal assistance.    Time 12    Period Weeks    Status Achieved      PT LONG TERM GOAL #2   Title Patient will complete sit <> stand transfer chair to chair with Min A and LRAD to improve functional independence for household and community mobility.     Baseline min A for STS, still requires elevated surface height to 27".  03/01/20- Patient currently unable to perform a chair to chair transfer and continues to be transferred using hoyer lift. Patient has been training with sit to stand at parallel bars and able to currently perform sit to stand with max assist  from 27in. high surface.03/31/2020- Patient remains at [facility] assist with sit to stand transfer and continued use of hoyer lift transfer for all chair to chair  or bed to chair transfers. 04/19/2020- Patient requires Max assist with sit to stand transfers and requires hoyer for chair to chair transfers.    Time 12    Period Weeks    Status On-going    Target Date 07/12/20      PT LONG TERM GOAL #3   Title Patient will navigate power w/c with min A x 100 feet to improve mobility for household and short community distances.    Baseline required total A (06/12/2018); able to roll 20 feet with min A (06/20/2018); 07/08/18: Pt can go approximately 10' without assist per family;  10/23/2018: pt's daughter states that his power WC mobility has become 100% better with the new hand control, slowed speed, and with practice; 10/12: pt's daughter reports that he can perform household WC mobility and limited community ambulation with minA    Time 12    Period Weeks    Status Achieved      PT LONG TERM GOAL #4   Title Patient will complete car transfers W/C to family SUV with min A using LRAD to improve his abilty to participate in community activities.     Baseline unable to complete car transfers at all (06/12/2018); unable to complete car transfers at all (06/19/2018).; 07/08/18: Unable to perform at this time; 09/11/18: unable to perform at this time; 10/23/2018: unable to complete car transfers at all; 10/12:  unable to complete car transfers at all, 10/29: unable; 01/13/19: unable; 2/11: min-mod A +2, 3/10: min A+1, 12/21: mod A. 03/01/2020- Patient unable to complete car transfers and continues to require medical transportation at this time. 3/14- Patient reports he may be purchasing a conversion van later this week.    Time 12    Period Weeks  Status Deferred      PT LONG TERM GOAL #5   Title Patient will ambulate at least 10 feet with min A using LRAD to improve mobility for household and community distances.    Baseline unable to take steps (06/12/2018); able to shift R leg forward and back with max A and RW at edge of plinth (06/19/2018); 07/08/18: unable to ambulate at  this time. 09/11/18: unable to perform at this time; 10/23/2018: unable to perform at this time; 10/12: unable to perform at this time, 10/29: unable at this time; 01/13/19: unable at this time, 2/11: min A to step with RLE, unable to step with LLE, 3/10: able to take 2 steps in parallel bars with min A for safety; 4/1: min A for walking 10 feet in parallel bars, 5/24: min A walking 10 feet in parallel bars; 6/21: no change 7/14: no change. 03/01/2020- Patient unable to ambulate and currently focusing on static standing. He was able to take a forward step with his right LE last week but unable today. 03/31/2020- Patient continues to be non-ambulatory but has been able to move right LE forward with standing but unable to take a step with left LE. 04/19/2020- Goal is still not appropriate as of yet- cont to focus on static and dynamic standing balance/pregait activities.    Time 12    Period Weeks    Status Deferred    Target Date 07/12/20      PT LONG TERM GOAL #6   Title Patient will be able to safety navigate up/down ramp with power chair, exhibiting good safety awareness, mod I to safely enter/exit his home.    Baseline 2/11: supervision with intermittent min A;4/1: supervision; 5/24: supervision. 03/01/2020- patient reports that he is able to navigate down the ramp at home to leave the home but continues to need assist to navigate up the ramp into the home.    Time 12    Period Weeks    Status Partially Met      PT LONG TERM GOAL #7   Title Patient will increase functional reach test to >15 inches in sitting to exhibit improved sitting balance and positioning and reduce fall risk;    Baseline 07/30/18: 12 inches; 09/11/18: 10-12 inches; 10/23/18: 12-13 inch; 10/12: 14.5", 10/29: 15.5 inch    Time 4    Period Weeks    Status Achieved      PT LONG TERM GOAL #8   Title Patient will increase LLE quad strength to 3+/5 to improve functional strength for standing and mobility;    Baseline 10/28: 2+/5;  01/13/19: 2+/5, 2/11: 3-/5, 04/16/19: 3-/5, 05/08/19: 3-/5, 5/24: 3-/5, 6/21: 3-/5, 7/14: 3-/5, 12/21: 3-/5. 03/01/2020- Left quad= 3-/5 (focusing on quad strengthening during visits and with home program). 03/31/2020- left quad = 3-/5 with MMT. 04/19/2020= 3- /5 with left quad strength with manual muscle testing.    Time 12    Period Weeks    Status On-going    Target Date 07/12/20      PT LONG TERM GOAL  #9   TITLE Patient will report no vertigo with provoking motions or positions in order to be able to perform ADLs and mobility tasks without symptoms.    Baseline 06/30/19: reports no dizziness in last week; 6/21: one episode this weekend; 7/14: experiencing one a week; 03/01/2020.- Patient has denied any dizziness for over past 3 weeks.    Time 12    Period Weeks    Status Achieved  PT LONG TERM GOAL  #10   TITLE Patient will demonstrate improved functional LE/Postural strength as seen by ability to perform static standing for 2 min or greater with max assist  to assist with strength required for ADL's and transfers/pregait/gait activities.    Baseline 03/01/2020- Patient able to stand approx 40 sec today with max assist in parallel bars. 03/31/2020- Patient able to demo static stand with max A for 66mn 47 sec today. 04/19/2020- Patient able to demo 2 min today with mod/max assist - yet remains inconsistent.    Time 8    Period Weeks    Status Partially Met    Target Date 07/12/20                 Plan - 07/07/20 1626    Clinical Impression Statement Patient continues to require constant Cues and assist to maintain posture today. He was able to stand multiple times today and did assist some but unable to stand well without extensive assist. He will continue to benefit from skilled PT services to improve his functional strength and postural control for improved transfers/standing endurance    Personal Factors and Comorbidities Age;Comorbidity 3+    Comorbidities Relevant past medical  history and comorbidities include long term steroid use for lupus, CKD, chronic osteomyelitis, cervical spine stenosis, BPH, APS, alcohol abuse, peripheral artery disease, depression, GERD, obstructive sleep apnea, HTN, IBS, IgA deficiency, lumbar radiculopathy, RA, Stroke, toxic maculopathy in both eyes, systemic lupus erythematosus related syndrome, cardiac catheterization, carotid endarterectomy, cervical laminectomy, coronary angioplasty, Fusion C5-C7, knee arthroplasty, lumbar surgery.    Examination-Activity Limitations Bed Mobility;Bend;Sit;Toileting;Stand;Stairs;Lift;Transfers;Squat;Locomotion Level;Carry;Dressing;Hygiene/Grooming;Continence    Examination-Participation Restrictions Yard Work;Interpersonal Relationship;Community Activity    Stability/Clinical Decision Making Evolving/Moderate complexity    Rehab Potential Fair    PT Frequency 2x / week    PT Duration 12 weeks    PT Treatment/Interventions ADLs/Self Care Home Management;Electrical Stimulation;Moist Heat;Cryotherapy;Gait training;Stair training;Functional mobility training;Therapeutic exercise;Balance training;Neuromuscular re-education;Cognitive remediation;Patient/family education;Orthotic Fit/Training;Wheelchair mobility training;Manual techniques;Passive range of motion;Energy conservation;Joint Manipulations;Canalith Repostioning;Therapeutic activities;Vestibular    PT Next Visit Plan Continue with progressive postural/LE strengthening/pregait activities    PT Home Exercise Plan no changes    Consulted and Agree with Plan of Care Patient;Family member/caregiver           Patient will benefit from skilled therapeutic intervention in order to improve the following deficits and impairments:  Abnormal gait,Decreased activity tolerance,Decreased cognition,Decreased endurance,Decreased knowledge of use of DME,Decreased range of motion,Decreased skin integrity,Decreased strength,Impaired perceived functional ability,Impaired  sensation,Impaired UE functional use,Improper body mechanics,Pain,Cardiopulmonary status limiting activity,Decreased balance,Decreased coordination,Decreased mobility,Difficulty walking,Impaired tone,Postural dysfunction,Dizziness  Visit Diagnosis: Abnormality of gait and mobility  Difficulty in walking, not elsewhere classified  Muscle weakness (generalized)     Problem List Patient Active Problem List   Diagnosis Date Noted  . Sepsis ([place] 10/18/2018  . Long term (current) use of anticoagulants 05/01/2018  . History of stroke 04/11/2018  . Hyperlipidemia 07/04/2017  . Chronic diarrhea 06/12/2017  . 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 Fatigue 02/06/2015  . Visual changes 06/19/2013  . Benign prostatic hyperplasia 01/07/2013  . Depression 01/07/2013  . Restless legs syndrome 01/07/2013  . Chronic kidney disease, stage 3 unspecified ([place] 12/20/2011  . CAD (coronary artery disease) 11/03/2010  . Hypertension 11/03/2010  . PAD (peripheral artery disease) ([place] 11/03/2010  . SLE (systemic lupus erythematosus) ([place] 11/03/2010    JLewis Moccasin PT 07/08/2020, 4:44 PM  [place] MAIN [facility] SERVICES [address] [place], [place], [address] Phone: [phone]   Fax:  [phone]  Name: Kenneth Summers MRN: [id number] Date of Birth: [birth year]

## 2020-07-12 ENCOUNTER — Encounter: Payer: Self-pay | Admitting: Occupational Therapy

## 2020-07-12 ENCOUNTER — Ambulatory Visit: Payer: Medicare Other | Admitting: Occupational Therapy

## 2020-07-12 ENCOUNTER — Ambulatory Visit: Payer: Medicare Other

## 2020-07-12 ENCOUNTER — Other Ambulatory Visit: Payer: Self-pay

## 2020-07-12 DIAGNOSIS — R269 Unspecified abnormalities of gait and mobility: Secondary | ICD-10-CM

## 2020-07-12 DIAGNOSIS — R262 Difficulty in walking, not elsewhere classified: Secondary | ICD-10-CM

## 2020-07-12 DIAGNOSIS — M6281 Muscle weakness (generalized): Secondary | ICD-10-CM

## 2020-07-12 NOTE — Therapy (Signed)
[place] MAIN [facility] SERVICES [address] [place], [place], [address] Phone: [phone]   Fax:  [phone]  Occupational Therapy Progress Note                                                                                                         Dates of reporting period  05/31/2020   to   07/12/2020  Patient Details  Name: Kenneth Summers MRN: [id number] Date of Birth: 04/23/1945 No data recorded  Encounter Date: 07/12/2020   OT End of Session - 07/12/20 1407    Visit Number 200    Number of Visits 254    Date for OT Re-Evaluation 08/16/20    Authorization Type Progress report period starting 05/31/2020    OT Start Time 1348    OT Stop Time 1430    OT Time Calculation (min) 42 min    Equipment Utilized During Treatment pegboard and patterns    Activity Tolerance Patient tolerated treatment well    Behavior During Therapy WFL for tasks assessed/performed           Past Medical History:  Diagnosis Date  . Actinic keratosis   . Alcohol abuse   . APS (antiphospholipid syndrome) ([place])   . Basal cell carcinoma   . BPH (benign prostatic hyperplasia)   . CAD (coronary artery disease)   . Cellulitis   . Cervical spinal stenosis   . Chronic osteomyelitis ([place])   . CKD (chronic kidney disease)   . CKD (chronic kidney disease)   . Clostridium difficile diarrhea   . Depression   . Diplopia   . Fatigue   . GERD (gastroesophageal reflux disease)   . Hyperlipemia   . Hypertension   . Hypovitaminosis D   . IBS (irritable bowel syndrome)   . 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 Insomnia   . Left lumbosacral radiculopathy   . Moderate obstructive sleep apnea   . Osteomyelitis of foot ([place])   . PAD (peripheral artery disease) ([place])   . Pruritus   . RA (rheumatoid arthritis) ([place])   . Radiculopathy   . Restless legs syndrome   . RLS (restless legs syndrome)   . SLE (systemic lupus erythematosus related syndrome) ([place])   . Squamous cell  carcinoma of skin 01/27/2019   right mid lower ear helix  . Stroke ([place])   . Toxic maculopathy of both eyes     Past Surgical History:  Procedure Laterality Date  . CARDIAC CATHETERIZATION    . CAROTID ENDARTERECTOMY    . CERVICAL LAMINECTOMY    . CORONARY ANGIOPLASTY    . FRACTURE SURGERY    . fusion C5-6-7    . HERNIA REPAIR    . KNEE ARTHROSCOPY    . TONSILLECTOMY      There were no vitals filed for this visit.   Subjective Assessment - 07/12/20 1406    Subjective  Pt reports he is going to stay inside for the holiday weekend.    Patient is accompanied by: Family member  Pertinent History Pt. is a 75 y.o. 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 Pt. PMHx includes: Multiple Falls, Lupus, DJD, Renal Abscess, CVA. Pt. resides with his wife. Pt.'s wife and daughter assist with ADLs. Pt. has caregivers in  for 2 hours a day, 6 days a week. Pt. received Rehab services in acute care, at SNF for STR, and [place] services. Pt. is retired from [employer] for [employer] and [employer].    Currently in Pain? No/denies              [facility] OT Assessment - 07/12/20 0001      Coordination   Left 9 Hole Peg Test >5 min.      Hand Function   Left Hand Grip (lbs) 30    Left Hand Lateral Pinch 8 lbs    Left 3 point pinch 7 lbs           OT TREATMENT   Therapeutic Exercise:  Pt.worked on BUE strengthening, and reciprocal motion using the UBE while seated for 8 min. with no resistance. Pt. performed3#dowelfor UE strengthening secondary to weakness.Pt.tolerated increased dowel weight.Bilateral shoulder flexion, chest press,andcircular patterns,were performedfor1-2 sets10-20reps eachwith cues to keep the dowel symmetrical, and level.Pt. worked on3# dumbbell ex. forrightelbow flexion and extension, 2# for the left. Pt. worked on BUE functional reaching with the LUE in multiple planes for cones, and crossing midline to stack  them.   Pt. continues to present with limited LUE functioning, requiring increased time to complete the 9-hole peg test today. Pt. required increased cues, and reminders to continue during the task. Pt.requiredfrequent verbalcues, as well as cues for visual demonstrationfor movement patterns, however required cues for visual demonstration.Pt. was able to maintain upright sitting in midline with a towel roll while on the UBE. Pt. did not require a towel roll today for lateral support while using the UBE today. Pt.continues torequire less cuesto perform UE strengthening exercises, technique, and formas well as visual demonstration prior to the exercises.Pt. continues to work on improving BUE strength, and [facility] skills in order to improve LUE functioning during ADLs, IADLs, and tranfers.                OT Education - 07/12/20 1407    Education Details strengthening, UE functioning    Person(s) Educated Patient;Caregiver(s)    Methods Explanation;Demonstration;Handout    Comprehension Returned demonstration;Tactile cues required;Verbalized understanding;Need further instruction               OT Long Term Goals - 07/12/20 1408      OT LONG TERM GOAL #1   Title Pt. will increase left shoulder flexion AROM by 10 degrees to access cabinet/shelf.    Baseline 05/31/2020: Left shoulder flexion 119 degrees. Pt. is able to reach into cabinets with more ease, however has difficulty getting close enough to them with his w/c.Pt. continues to present with limited left shoulder ROM limitng access to cabinets, and shelves.03/01/2020: Left shoulder 116 degrees with new onset of numbness radiating from the left side of the neck to the hand. Pt. continues to progress with left shoulder ROM .Shouler flexion 130, and continues to work on reaching with increasing emphasis on flexing trunk to improve functional reach. 200th visit: shoulder flexion to 130 with effort    Time 12    Period Weeks     Status On-going    Target Date 08/16/20      OT LONG TERM GOAL #2  Title Pt. will donn a shirt with Supervision.    Baseline 05/31/2020: [place]. Pt. is assisting more with UE dressing at home. Pt. continues to require modA donning a zip down jacket with consistent cues to initiate the correct direction of the jacket.  Min-modA mahaging the zipper. MinA for donning a T-shirt with assist to roll side to side to pull the shirt down over his back. 200th visit: continues to require min assist at times for shirt/jacket    Time 12    Period Weeks    Status On-going    Target Date 08/16/20      OT LONG TERM GOAL #3   Title Pt. will require ModA to perform LE dressing    Period Weeks      OT LONG TERM GOAL #4   Title Pt. will improve left grip strength by 10# to be able to open a jar/container.    Baseline Grip strength continues to be limited. Decreased left sided grip strength today secondary to a new onset of numbenss, and weakness radiating from the neck to the left hand. 200th visit: Pt. continues to have a harder time opening a wide mouthed jar, however is able to open a smaller jar easier.    Time 12    Period Weeks    Status On-going    Target Date 08/16/20      OT LONG TERM GOAL #5   Title Pt. will improve left hand [facility] skills to be able to assist with buttoning/zipping.    Baseline 05/24/2020: Pt. is improving with fastening small buttons. Pt. continues to work on improving [facility] skills  to improve buttoning , and zipping.03/01/2020: Pt.has had a new onset of weakness, and numbness in the left hand beginning upon waking up this morning which has resulted in a significant change in [facility] skills.  Pt. has improved overall donning a jacket, and  manipulating the zipper on his jacket, however continues to require practice depending on the type of zipper jacket. Pt. continues to work on improving bilateral [facility] skills for buttoning/zipping.  200th visit: Pt. has improved with buttons and zippers, however  has difficulty manipulating buttons up higher on the shirt.    Time 12    Period Weeks    Status Achieved      OT LONG TERM GOAL #6   Title Pt. will demonstrate visual conmpensatory strategies for 100% of the time during ADLs    Baseline Improving, however Pt. continues to require intermittent cues cues for left sides awareness.    Time 12    Period Weeks    Status On-going    Target Date 08/16/20      OT LONG TERM GOAL #7   Title Pt. will prepare a simple cold snack from w/c with supervision using cognitive compensatory strategies 100% of the time.    Baseline 200th visit: Pt. has difficulty with getting the w/c close enough to access the refrigerator, and counters    Time 12    Period Weeks    Status Deferred      OT LONG TERM GOAL #8   Title Pt. will accurately identify potential safety hazard using good safety awareness, and judgement 100% for ADLs, and IADLs.    Baseline 200th visit: Pt. continues to require cues    Time 12    Period Weeks    Status On-going    Target Date 08/16/20      OT LONG TERM GOAL  #9   TITLE Pt.  will  navigate his w/c around obstacles with Supervision and 100% accuracy    Baseline Pt. has progressed, however, continues to require cuing for obstacles on the left with fewer  cues overall. Pt. requires significant cues to engage his left UE during tasks.. 200th visit: continues to bump into walls at home with the w/ct at times    Time 12    Period Weeks    Status On-going    Target Date 08/16/20      OT LONG TERM GOAL  #10   TITLE Pt. will independently be able  to reach to place items into cabinetry, and closets.    Baseline 200th visit: Pt. continues to have difficulty with functional reaching continues to be limited.    Time 12    Period Weeks    Status On-going    Target Date 08/16/20      OT LONG TERM GOAL  #11   TITLE Pt. will increase BUE strength by 21mm grades in preparation for ADL transfers.    Baseline 05/31/2020: Pt. is progressing,  however BUE strength continues to be limited. Pt. is able to tolerate increased resistive weights.    Time 12    Period Weeks    Status On-going    Target Date 08/16/20      OT LONG TERM GOAL  #12   TITLE Pt. will improve LUE functional reaching with the left engaging the trunk 100% of the time during ADL tasks with minimal cues.    Baseline 200th visit: Pt. is limited with LUE functional reaching.    Time 12    Period Weeks    Status On-going    Target Date 08/16/20                 Plan - 07/12/20 1408    Clinical Impression Statement Pt. continues to present with limited LUE functioning, requiring increased time to complete the 9-hole peg test today. Pt. required increased cues, and reminders to continue during the task. Pt.requiredfrequent verbalcues, as well as cues for visual demonstrationfor movement patterns, however required cues for visual demonstration.Pt. was able to maintain upright sitting in midline with a towel roll while on the UBE. Pt. did not require a towel roll today for lateral support while using the UBE today. Pt.continues torequire less cuesto perform UE strengthening exercises, technique, and formas well as visual demonstration prior to the exercises.Pt. continues to work on improving BUE strength, and [facility] skills in order to improve LUE functioning during ADLs, IADLs, and tranfers.     OT Occupational Profile and History Problem Focused Assessment - Including review of records relating to presenting problem    Occupational performance deficits (Please refer to evaluation for details): ADL's    Body Structure / Function / Physical Skills UE functional use;Coordination;FMC;Dexterity;Strength;ROM    Cognitive Skills Attention;Memory;Emotional;Problem Solve;Safety Awareness    Rehab Potential Good    Clinical Decision Making Several treatment options, min-mod task modification necessary    Comorbidities Affecting Occupational Performance: Presence of  comorbidities impacting occupational performance    Comorbidities impacting occupational performance description: Phyical, cognitive, visual,  medical comorbidities    Modification or Assistance to Complete Evaluation  No modification of tasks or assist necessary to complete eval    OT Frequency 2x / week    OT Duration 12 weeks    OT Treatment/Interventions Self-care/ADL training;DME and/or AE instruction;Therapeutic exercise;Therapeutic activities;Moist Heat;Cognitive remediation/compensation;Neuromuscular education;Visual/perceptual remediation/compensation;Coping strategies training;Patient/family education;Passive range of motion;Psychosocial skills training;Energy conservation;Functional Mobility Training  Consulted and Agree with Plan of Care Patient;Family member/caregiver           Patient will benefit from skilled therapeutic intervention in order to improve the following deficits and impairments:   Body Structure / Function / Physical Skills: UE functional use,Coordination,FMC,Dexterity,Strength,ROM Cognitive Skills: Attention,Memory,Emotional,Problem Solve,Safety Awareness     Visit Diagnosis: Muscle weakness (generalized)    Problem List Patient Active Problem List   Diagnosis Date Noted  . Sepsis ([place]) 10/18/2018  . Long term (current) use of anticoagulants 05/01/2018  . History of stroke 04/11/2018  . Hyperlipidemia 07/04/2017  . Chronic diarrhea 06/12/2017  . 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 Fatigue 02/06/2015  . Visual changes 06/19/2013  . Benign prostatic hyperplasia 01/07/2013  . Depression 01/07/2013  . Restless legs syndrome 01/07/2013  . Chronic kidney disease, stage 3 unspecified ([place]) 12/20/2011  . CAD (coronary artery disease) 11/03/2010  . Hypertension 11/03/2010  . PAD (peripheral artery disease) ([place]) 11/03/2010  . SLE (systemic lupus  erythematosus) ([place]) 11/03/2010    Kenneth Carina, MS, OTR/L 07/12/2020, 3:44 PM  [place] MAIN [facility] SERVICES [address] [place], [place], [address] Phone: [phone]   Fax:  [phone]  Name: Kenneth Summers MRN: [id number] Date of Birth: 07/06/1945

## 2020-07-12 NOTE — Therapy (Signed)
[place] MAIN [facility] SERVICES [address] [place], [place], [address] Phone: [phone]   Fax:  [phone]  Physical Therapy Treatment  Patient Details  Name: Kenneth Summers MRN: [id number] Date of Birth: 1946/01/23 No data recorded  Encounter Date: 07/12/2020   PT End of Session - 07/12/20 1322    Visit Number [id number]    Number of Visits 276    Date for PT Re-Evaluation 07/12/20    Authorization Type Medicare reporting period starting 09/11/18; NO FOTO    Authorization Time Period Recert 02/07/7508-03/13/8525    PT Start Time 1300    PT Stop Time 1343    PT Time Calculation (min) 43 min    Equipment Utilized During Treatment Gait belt    Activity Tolerance No increased pain;Patient limited by fatigue;Patient tolerated treatment well    Behavior During Therapy [facility] for tasks assessed/performed           Past Medical History:  Diagnosis Date  . Actinic keratosis   . Alcohol abuse   . APS (antiphospholipid syndrome) ([place])   . Basal cell carcinoma   . BPH (benign prostatic hyperplasia)   . CAD (coronary artery disease)   . Cellulitis   . Cervical spinal stenosis   . Chronic osteomyelitis ([place])   . CKD (chronic kidney disease)   . CKD (chronic kidney disease)   . Clostridium difficile diarrhea   . Depression   . Diplopia   . Fatigue   . GERD (gastroesophageal reflux disease)   . Hyperlipemia   . Hypertension   . Hypovitaminosis D   . IBS (irritable bowel syndrome)   . 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 Insomnia   . Left lumbosacral radiculopathy   . Moderate obstructive sleep apnea   . Osteomyelitis of foot ([place])   . PAD (peripheral artery disease) ([place])   . Pruritus   . RA (rheumatoid arthritis) ([place])   . Radiculopathy   . Restless legs syndrome   . RLS (restless legs syndrome)   . SLE (systemic lupus erythematosus related syndrome) ([place])   . Squamous cell carcinoma of skin 01/27/2019   right mid lower ear helix  . Stroke ([place])    . Toxic maculopathy of both eyes     Past Surgical History:  Procedure Laterality Date  . CARDIAC CATHETERIZATION    . CAROTID ENDARTERECTOMY    . CERVICAL LAMINECTOMY    . CORONARY ANGIOPLASTY    . FRACTURE SURGERY    . fusion C5-6-7    . HERNIA REPAIR    . KNEE ARTHROSCOPY    . TONSILLECTOMY      There were no vitals filed for this visit.   Subjective Assessment - 07/12/20 1306    Subjective Patient reports doing okay overall- States no changes since last visit.    Patient is accompained by: Family member    Pertinent History Patient is a 75 y.o. male who presents to outpatient physical therapy with a referral for medical diagnosis of CVA. This patient's chief complaints consist of left hemiplegia and overall deconditioning leading to the following functional deficits: dependent for ADLs, IADLs, unable to transfer to car, dependent transfers at home with hoyer lift, unable to walk or stand without significant assistance, difficulty with W/C navigation..    Limitations Sitting;Lifting;Standing;House hold activities;Writing;Walking;Other (comment)    Diagnostic tests Brain MRI 11/01/2017: IMPRESSION: 1. Acute right ACA territory infarct as demonstrated on prior CT imaging. No intracranial hemorrhage or significant mass effect. 2. Age  advanced global brain atrophy and small chronic right occipital Infarct.    Patient Stated Goals wants to be able to walk again    Currently in Pain? No/denies    Pain Onset Today    Pain Onset Today           Interventions:    Seated hip march- YTB on right, AAROM on left x 12 reps Seated knee ext- YTB on right, AAROM on left x 12 reps Postural ex- lean forward (nose over toes then back) x 10 reps  Education provided throughout session via VC/TC and demonstration to facilitate movement at target joints and correct muscle activation for all testing and exercises performed.  Neuromuscular re-edu:   Standing static with max assist with sit to  stand x 5 trials:  1) 1 min 08 sec- Max cues to extend hips and left knee 2) 39 sec with same cues (verbal and tactile to stand as erect as possible)  3) 1 min 5 sec 4) 42 sec - Patient able to extend better - yet unable to maintain > 5 sec  5) 31 sec - Patient unable to stand erect - max assist to block left knee   Patient continues to exhibit poor standing posture- constant VC/TC - patient able to improve slightly with practice.   Clinical Impression: Patient able to stand with max assist but unable to progress with postural despite constant cues today. He was able to extend briefly better at hips and left knee yet unable to maintain > 5 sec consistently. He will continue to benefit from skilled PT services to improve his functional strength and postural control for improved transfers/standing endurance                      PT Education - 07/12/20 1321    Education Details specific exercise form    Person(s) Educated Patient    Methods Explanation;Demonstration;Tactile cues;Verbal cues    Comprehension Verbalized understanding;Returned demonstration;Tactile cues required;Verbal cues required;Need further instruction            PT Short Term Goals - 04/19/20 2317      PT SHORT TERM GOAL #1   Title Be independent with initial home exercise program for self-management of symptoms.    Baseline HEP to be given at second session; advice to practice unsupported sitting at home (06/19/2018); 07/08/18: Performing at home, 6/29: needs assistance but is doing exercise program regularly; 08/22/18: adherent;    Time 2    Period Weeks    Status Achieved    Target Date 06/26/18      PT SHORT TERM GOAL #2   Title Patient will perform sit to stand transfer with mod Assist in parallel bars for improved transfer ability with decreased caregiver assist.    Baseline 04/19/2020- Max assist of 1 person    Time 6    Period Weeks    Status New    Target Date 05/31/20              PT Long Term Goals - 04/19/20 1321      PT LONG TERM GOAL #1   Title Patient will complete all bed mobility with min A to improve functional independence for getting in and out of bed and adjusting in bed.     Baseline max A - total A reported by family (06/19/2018); 07/08/18: still requires maxA, dtr reports improvement in rolling since starting therapy; 6/23: min A to supervision in clinic; not doing at  home right; 8/5: Pt's daughter states that his rolling has improved, but still has difficulty with all other bed mobility; 10/23/18: pts daughter states that she is doing about 35% of the work if he is in proper position, he does uses the bed rails to help roll;  using Harrel Lemon for coming to sit EOB; 10/12: pts daughter states that she is doing about 30% of the work if he is in proper position, he does uses the bed rails to help roll, but it is improving;  using Harrel Lemon for coming to sit EOB, 10/29: min A for sit to sidelying, rolling supervision, able to transition sidelying to sit with HHA; 12/21: min-mod A. 03/01/2020- Patient presents improving bed mobility with current ability to roll to left with CGA and verbal and tactile cues and min assist to roll to right as seen by recent visits. 03/31/2020- Patient and caregiver report that he has maintained ability to roll side to side with only minimal assistance.    Time 12    Period Weeks    Status Achieved      PT LONG TERM GOAL #2   Title Patient will complete sit <> stand transfer chair to chair with Min A and LRAD to improve functional independence for household and community mobility.     Baseline min A for STS, still requires elevated surface height to 27".  03/01/20- Patient currently unable to perform a chair to chair transfer and continues to be transferred using hoyer lift. Patient has been training with sit to stand at parallel bars and able to currently perform sit to stand with max assist  from 27in. high surface.03/31/2020- Patient remains at [facility]  assist with sit to stand transfer and continued use of hoyer lift transfer for all chair to chair or bed to chair transfers. 04/19/2020- Patient requires Max assist with sit to stand transfers and requires hoyer for chair to chair transfers.    Time 12    Period Weeks    Status On-going    Target Date 07/12/20      PT LONG TERM GOAL #3   Title Patient will navigate power w/c with min A x 100 feet to improve mobility for household and short community distances.    Baseline required total A (06/12/2018); able to roll 20 feet with min A (06/20/2018); 07/08/18: Pt can go approximately 10' without assist per family;  10/23/2018: pt's daughter states that his power WC mobility has become 100% better with the new hand control, slowed speed, and with practice; 10/12: pt's daughter reports that he can perform household WC mobility and limited community ambulation with minA    Time 12    Period Weeks    Status Achieved      PT LONG TERM GOAL #4   Title Patient will complete car transfers W/C to family SUV with min A using LRAD to improve his abilty to participate in community activities.     Baseline unable to complete car transfers at all (06/12/2018); unable to complete car transfers at all (06/19/2018).; 07/08/18: Unable to perform at this time; 09/11/18: unable to perform at this time; 10/23/2018: unable to complete car transfers at all; 10/12:  unable to complete car transfers at all, 10/29: unable; 01/13/19: unable; 2/11: min-mod A +2, 3/10: min A+1, 12/21: mod A. 03/01/2020- Patient unable to complete car transfers and continues to require medical transportation at this time. 3/14- Patient reports he may be purchasing a conversion van later this week.    Time  12    Period Weeks    Status Deferred      PT LONG TERM GOAL #5   Title Patient will ambulate at least 10 feet with min A using LRAD to improve mobility for household and community distances.    Baseline unable to take steps (06/12/2018); able to shift R leg  forward and back with max A and RW at edge of plinth (06/19/2018); 07/08/18: unable to ambulate at this time. 09/11/18: unable to perform at this time; 10/23/2018: unable to perform at this time; 10/12: unable to perform at this time, 10/29: unable at this time; 01/13/19: unable at this time, 2/11: min A to step with RLE, unable to step with LLE, 3/10: able to take 2 steps in parallel bars with min A for safety; 4/1: min A for walking 10 feet in parallel bars, 5/24: min A walking 10 feet in parallel bars; 6/21: no change 7/14: no change. 03/01/2020- Patient unable to ambulate and currently focusing on static standing. He was able to take a forward step with his right LE last week but unable today. 03/31/2020- Patient continues to be non-ambulatory but has been able to move right LE forward with standing but unable to take a step with left LE. 04/19/2020- Goal is still not appropriate as of yet- cont to focus on static and dynamic standing balance/pregait activities.    Time 12    Period Weeks    Status Deferred    Target Date 07/12/20      PT LONG TERM GOAL #6   Title Patient will be able to safety navigate up/down ramp with power chair, exhibiting good safety awareness, mod I to safely enter/exit his home.    Baseline 2/11: supervision with intermittent min A;4/1: supervision; 5/24: supervision. 03/01/2020- patient reports that he is able to navigate down the ramp at home to leave the home but continues to need assist to navigate up the ramp into the home.    Time 12    Period Weeks    Status Partially Met      PT LONG TERM GOAL #7   Title Patient will increase functional reach test to >15 inches in sitting to exhibit improved sitting balance and positioning and reduce fall risk;    Baseline 07/30/18: 12 inches; 09/11/18: 10-12 inches; 10/23/18: 12-13 inch; 10/12: 14.5", 10/29: 15.5 inch    Time 4    Period Weeks    Status Achieved      PT LONG TERM GOAL #8   Title Patient will increase LLE quad strength to  3+/5 to improve functional strength for standing and mobility;    Baseline 10/28: 2+/5; 01/13/19: 2+/5, 2/11: 3-/5, 04/16/19: 3-/5, 05/08/19: 3-/5, 5/24: 3-/5, 6/21: 3-/5, 7/14: 3-/5, 12/21: 3-/5. 03/01/2020- Left quad= 3-/5 (focusing on quad strengthening during visits and with home program). 03/31/2020- left quad = 3-/5 with MMT. 04/19/2020= 3- /5 with left quad strength with manual muscle testing.    Time 12    Period Weeks    Status On-going    Target Date 07/12/20      PT LONG TERM GOAL  #9   TITLE Patient will report no vertigo with provoking motions or positions in order to be able to perform ADLs and mobility tasks without symptoms.    Baseline 06/30/19: reports no dizziness in last week; 6/21: one episode this weekend; 7/14: experiencing one a week; 03/01/2020.- Patient has denied any dizziness for over past 3 weeks.    Time 12  Period Weeks    Status Achieved      PT LONG TERM GOAL  #10   TITLE Patient will demonstrate improved functional LE/Postural strength as seen by ability to perform static standing for 2 min or greater with max assist  to assist with strength required for ADL's and transfers/pregait/gait activities.    Baseline 03/01/2020- Patient able to stand approx 40 sec today with max assist in parallel bars. 03/31/2020- Patient able to demo static stand with max A for 66mn 47 sec today. 04/19/2020- Patient able to demo 2 min today with mod/max assist - yet remains inconsistent.    Time 8    Period Weeks    Status Partially Met    Target Date 07/12/20                 Plan - 07/12/20 1322    Clinical Impression Statement Patient able to stand with max assist but unable to progress with postural despite constant cues today. He was able to extend briefly better at hips and left knee yet unable to maintain > 5 sec consistently. He will continue to benefit from skilled PT services to improve his functional strength and postural control for improved transfers/standing  endurance    Personal Factors and Comorbidities Age;Comorbidity 3+    Comorbidities Relevant past medical history and comorbidities include long term steroid use for lupus, CKD, chronic osteomyelitis, cervical spine stenosis, BPH, APS, alcohol abuse, peripheral artery disease, depression, GERD, obstructive sleep apnea, HTN, IBS, IgA deficiency, lumbar radiculopathy, RA, Stroke, toxic maculopathy in both eyes, systemic lupus erythematosus related syndrome, cardiac catheterization, carotid endarterectomy, cervical laminectomy, coronary angioplasty, Fusion C5-C7, knee arthroplasty, lumbar surgery.    Examination-Activity Limitations Bed Mobility;Bend;Sit;Toileting;Stand;Stairs;Lift;Transfers;Squat;Locomotion Level;Carry;Dressing;Hygiene/Grooming;Continence    Examination-Participation Restrictions Yard Work;Interpersonal Relationship;Community Activity    Stability/Clinical Decision Making Evolving/Moderate complexity    Rehab Potential Fair    PT Frequency 2x / week    PT Duration 12 weeks    PT Treatment/Interventions ADLs/Self Care Home Management;Electrical Stimulation;Moist Heat;Cryotherapy;Gait training;Stair training;Functional mobility training;Therapeutic exercise;Balance training;Neuromuscular re-education;Cognitive remediation;Patient/family education;Orthotic Fit/Training;Wheelchair mobility training;Manual techniques;Passive range of motion;Energy conservation;Joint Manipulations;Canalith Repostioning;Therapeutic activities;Vestibular    PT Next Visit Plan Continue with progressive postural/LE strengthening/pregait activities    PT Home Exercise Plan no changes    Consulted and Agree with Plan of Care Patient;Family member/caregiver           Patient will benefit from skilled therapeutic intervention in order to improve the following deficits and impairments:  Abnormal gait,Decreased activity tolerance,Decreased cognition,Decreased endurance,Decreased knowledge of use of DME,Decreased  range of motion,Decreased skin integrity,Decreased strength,Impaired perceived functional ability,Impaired sensation,Impaired UE functional use,Improper body mechanics,Pain,Cardiopulmonary status limiting activity,Decreased balance,Decreased coordination,Decreased mobility,Difficulty walking,Impaired tone,Postural dysfunction,Dizziness  Visit Diagnosis: Abnormality of gait and mobility  Difficulty in walking, not elsewhere classified  Muscle weakness (generalized)     Problem List Patient Active Problem List   Diagnosis Date Noted  . Sepsis ([place] 10/18/2018  . Long term (current) use of anticoagulants 05/01/2018  . History of stroke 04/11/2018  . Hyperlipidemia 07/04/2017  . Chronic diarrhea 06/12/2017  . 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 Fatigue 02/06/2015  . Visual changes 06/19/2013  . Benign prostatic hyperplasia 01/07/2013  . Depression 01/07/2013  . Restless legs syndrome 01/07/2013  . Chronic kidney disease, stage 3 unspecified ([place] 12/20/2011  . CAD (coronary artery disease) 11/03/2010  . Hypertension 11/03/2010  . PAD (peripheral artery disease) ([place] 11/03/2010  .  SLE (systemic lupus erythematosus) ([place]) 11/03/2010    Lewis Moccasin, PT 07/12/2020, 10:16 PM  [place] MAIN [facility] SERVICES [address] [place], [place], [address] Phone: [phone]   Fax:  [phone]  Name: REFAEL FULOP MRN: [id number] Date of Birth: 06-Jun-1945

## 2020-07-14 ENCOUNTER — Ambulatory Visit: Payer: Medicare Other

## 2020-07-14 ENCOUNTER — Ambulatory Visit: Payer: Medicare Other | Admitting: Occupational Therapy

## 2020-07-14 ENCOUNTER — Encounter: Payer: Self-pay | Admitting: Occupational Therapy

## 2020-07-14 ENCOUNTER — Other Ambulatory Visit: Payer: Self-pay

## 2020-07-14 DIAGNOSIS — R2689 Other abnormalities of gait and mobility: Secondary | ICD-10-CM

## 2020-07-14 DIAGNOSIS — R269 Unspecified abnormalities of gait and mobility: Secondary | ICD-10-CM | POA: Diagnosis not present

## 2020-07-14 DIAGNOSIS — R278 Other lack of coordination: Secondary | ICD-10-CM

## 2020-07-14 DIAGNOSIS — M6281 Muscle weakness (generalized): Secondary | ICD-10-CM

## 2020-07-14 DIAGNOSIS — R262 Difficulty in walking, not elsewhere classified: Secondary | ICD-10-CM

## 2020-07-14 NOTE — Therapy (Signed)
[place] MAIN [facility] SERVICES [address] [place], [place], [address] Phone: [phone]   Fax:  [phone]  Occupational Therapy Treatment  Patient Details  Name: Kenneth Summers MRN: [id number] Date of Birth: 1945-09-22 No data recorded  Encounter Date: 07/14/2020   OT End of Session - 07/14/20 1352    Visit Number 201    Number of Visits 254    Date for OT Re-Evaluation 08/16/20    Authorization Type Progress report period starting 05/31/2020    OT Start Time 1345    OT Stop Time 1430    OT Time Calculation (min) 45 min    Equipment Utilized During Treatment pegboard and patterns    Activity Tolerance Patient tolerated treatment well    Behavior During Therapy WFL for tasks assessed/performed           Past Medical History:  Diagnosis Date  . Actinic keratosis   . Alcohol abuse   . APS (antiphospholipid syndrome) ([place])   . Basal cell carcinoma   . BPH (benign prostatic hyperplasia)   . CAD (coronary artery disease)   . Cellulitis   . Cervical spinal stenosis   . Chronic osteomyelitis ([place])   . CKD (chronic kidney disease)   . CKD (chronic kidney disease)   . Clostridium difficile diarrhea   . Depression   . Diplopia   . Fatigue   . GERD (gastroesophageal reflux disease)   . Hyperlipemia   . Hypertension   . Hypovitaminosis D   . IBS (irritable bowel syndrome)   . 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 Insomnia   . Left lumbosacral radiculopathy   . Moderate obstructive sleep apnea   . Osteomyelitis of foot ([place])   . PAD (peripheral artery disease) ([place])   . Pruritus   . RA (rheumatoid arthritis) ([place])   . Radiculopathy   . Restless legs syndrome   . RLS (restless legs syndrome)   . SLE (systemic lupus erythematosus related syndrome) ([place])   . Squamous cell carcinoma of skin 01/27/2019   right mid lower ear helix  . Stroke ([place])   . Toxic maculopathy of both eyes     Past Surgical History:  Procedure Laterality Date   . CARDIAC CATHETERIZATION    . CAROTID ENDARTERECTOMY    . CERVICAL LAMINECTOMY    . CORONARY ANGIOPLASTY    . FRACTURE SURGERY    . fusion C5-6-7    . HERNIA REPAIR    . KNEE ARTHROSCOPY    . TONSILLECTOMY      There were no vitals filed for this visit.   Subjective Assessment - 07/14/20 1351    Subjective  Pt. reports that he is feeling fine today.    Patient is accompanied by: Family member    Pertinent History Pt. is a 75 y.o. 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 Pt. PMHx includes: Multiple Falls, Lupus, DJD, Renal Abscess, CVA. Pt. resides with his wife. Pt.'s wife and daughter assist with ADLs. Pt. has caregivers in  for 2 hours a day, 6 days a week. Pt. received Rehab services in acute care, at SNF for STR, and [place] services. Pt. is retired from [employer] for [employer] and [employer].    Currently in Pain? No/denies            OT TREATMENT   Therapeutic Exercise:  Pt.worked on BUE strengthening, and reciprocal motion using the UBE while seated for 8  min. with no resistance. Pt. performed2.5#dowelfor UE strengthening secondary to weakness.Pt.tolerated increased dowel weight.Bilateral shoulder flexion, chest press,andcircular patterns,were performedfor1-2 sets10-20reps eachwith cues to keep the dowel symmetrical, and level.Pt. worked on3# dumbbell ex. forrightelbow flexion and extension,and supination/pronation 2# for the left.  Pt.continuestomakesteadyprogress with BUE strength, and tolerance for there.ex. Pt.requiredfrequent verbalcues, and cues for visual demonstrationfor movement patterns, however required cues for visual demonstration.Pt.continues to require consistent verbal, visual, and tactile cues.Pt.continues torequire cues for radial deviation.Pt. Presented with increased reach span in all planes. Pt. Was able to maintain upright sitting in midline with a towel roll. Pt. Required 5  min & 45 sec. To complete the 9 hole peg test.Pt. required a towel roll today for lateral support.Pt.continues torequire less cuesto perform UE strengthening exercises, technique, and formas well as visual demonstration prior to the exercises.Pt. continues to work on improving BUE strength, and [facility] skills in order to improve LUE functioning during ADLs, IADLs, and tranfers.                        OT Education - 07/14/20 1351    Education Details strengthening, UE functioning    Person(s) Educated Patient;Caregiver(s)    Methods Explanation;Demonstration;Handout    Comprehension Returned demonstration;Tactile cues required;Verbalized understanding;Need further instruction               OT Long Term Goals - 07/12/20 1408      OT LONG TERM GOAL #1   Title Pt. will increase left shoulder flexion AROM by 10 degrees to access cabinet/shelf.    Baseline 05/31/2020: Left shoulder flexion 119 degrees. Pt. is able to reach into cabinets with more ease, however has difficulty getting close enough to them with his w/c.Pt. continues to present with limited left shoulder ROM limitng access to cabinets, and shelves.03/01/2020: Left shoulder 116 degrees with new onset of numbness radiating from the left side of the neck to the hand. Pt. continues to progress with left shoulder ROM .Shouler flexion 130, and continues to work on reaching with increasing emphasis on flexing trunk to improve functional reach. 200th visit: shoulder flexion to 130 with effort    Time 12    Period Weeks    Status On-going    Target Date 08/16/20      OT LONG TERM GOAL #2   Title Pt. will donn a shirt with Supervision.    Baseline 05/31/2020: [place]. Pt. is assisting more with UE dressing at home. Pt. continues to require modA donning a zip down jacket with consistent cues to initiate the correct direction of the jacket.  Min-modA mahaging the zipper. MinA for donning a T-shirt with assist to roll side to  side to pull the shirt down over his back. 200th visit: continues to require min assist at times for shirt/jacket    Time 12    Period Weeks    Status On-going    Target Date 08/16/20      OT LONG TERM GOAL #3   Title Pt. will require ModA to perform LE dressing    Period Weeks      OT LONG TERM GOAL #4   Title Pt. will improve left grip strength by 10# to be able to open a jar/container.    Baseline Grip strength continues to be limited. Decreased left sided grip strength today secondary to a new onset of numbenss, and weakness radiating from the neck to the left hand. 200th visit: Pt. continues to have a harder time opening  a wide mouthed jar, however is able to open a smaller jar easier.    Time 12    Period Weeks    Status On-going    Target Date 08/16/20      OT LONG TERM GOAL #5   Title Pt. will improve left hand [facility] skills to be able to assist with buttoning/zipping.    Baseline 05/24/2020: Pt. is improving with fastening small buttons. Pt. continues to work on improving [facility] skills  to improve buttoning , and zipping.03/01/2020: Pt.has had a new onset of weakness, and numbness in the left hand beginning upon waking up this morning which has resulted in a significant change in [facility] skills.  Pt. has improved overall donning a jacket, and  manipulating the zipper on his jacket, however continues to require practice depending on the type of zipper jacket. Pt. continues to work on improving bilateral [facility] skills for buttoning/zipping.  200th visit: Pt. has improved with buttons and zippers, however has difficulty manipulating buttons up higher on the shirt.    Time 12    Period Weeks    Status Achieved      OT LONG TERM GOAL #6   Title Pt. will demonstrate visual conmpensatory strategies for 100% of the time during ADLs    Baseline Improving, however Pt. continues to require intermittent cues cues for left sides awareness.    Time 12    Period Weeks    Status On-going    Target Date  08/16/20      OT LONG TERM GOAL #7   Title Pt. will prepare a simple cold snack from w/c with supervision using cognitive compensatory strategies 100% of the time.    Baseline 200th visit: Pt. has difficulty with getting the w/c close enough to access the refrigerator, and counters    Time 12    Period Weeks    Status Deferred      OT LONG TERM GOAL #8   Title Pt. will accurately identify potential safety hazard using good safety awareness, and judgement 100% for ADLs, and IADLs.    Baseline 200th visit: Pt. continues to require cues    Time 12    Period Weeks    Status On-going    Target Date 08/16/20      OT LONG TERM GOAL  #9   TITLE Pt. will  navigate his w/c around obstacles with Supervision and 100% accuracy    Baseline Pt. has progressed, however, continues to require cuing for obstacles on the left with fewer  cues overall. Pt. requires significant cues to engage his left UE during tasks.. 200th visit: continues to bump into walls at home with the w/ct at times    Time 12    Period Weeks    Status On-going    Target Date 08/16/20      OT LONG TERM GOAL  #10   TITLE Pt. will independently be able  to reach to place items into cabinetry, and closets.    Baseline 200th visit: Pt. continues to have difficulty with functional reaching continues to be limited.    Time 12    Period Weeks    Status On-going    Target Date 08/16/20      OT LONG TERM GOAL  #11   TITLE Pt. will increase BUE strength by 98mm grades in preparation for ADL transfers.    Baseline 05/31/2020: Pt. is progressing, however BUE strength continues to be limited. Pt. is able to tolerate increased resistive weights.  Time 12    Period Weeks    Status On-going    Target Date 08/16/20      OT LONG TERM GOAL  #12   TITLE Pt. will improve LUE functional reaching with the left engaging the trunk 100% of the time during ADL tasks with minimal cues.    Baseline 200th visit: Pt. is limited with LUE functional  reaching.    Time 12    Period Weeks    Status On-going    Target Date 08/16/20                 Plan - 07/14/20 1354    Clinical Impression Statement Pt.continuestomakesteadyprogress with BUE strength, and tolerance for there.ex. Pt.requiredfrequent verbalcues, and cues for visual demonstrationfor movement patterns, however required cues for visual demonstration.Pt.continues to require consistent verbal, visual, and tactile cues.Pt.continues torequire cues for radial deviation.Pt. Presented with increased reach span in all planes. Pt. Was able to maintain upright sitting in midline with a towel roll. Pt. Required 5 min & 45 sec. To complete the 9 hole peg test.Pt. required a towel roll today for lateral support.Pt.continues torequire less cuesto perform UE strengthening exercises, technique, and formas well as visual demonstration prior to the exercises.Pt. continues to work on improving BUE strength, and [facility] skills in order to improve LUE functioning during ADLs, IADLs, and tranfers.   OT Occupational Profile and History Problem Focused Assessment - Including review of records relating to presenting problem    Occupational performance deficits (Please refer to evaluation for details): ADL's    Body Structure / Function / Physical Skills UE functional use;Coordination;FMC;Dexterity;Strength;ROM    Cognitive Skills Attention;Memory;Emotional;Problem Solve;Safety Awareness    Rehab Potential Good    Clinical Decision Making Several treatment options, min-mod task modification necessary    Comorbidities Affecting Occupational Performance: Presence of comorbidities impacting occupational performance    Modification or Assistance to Complete Evaluation  No modification of tasks or assist necessary to complete eval    OT Frequency 2x / week    OT Duration 12 weeks    OT Treatment/Interventions Self-care/ADL training;DME and/or AE instruction;Therapeutic  exercise;Therapeutic activities;Moist Heat;Cognitive remediation/compensation;Neuromuscular education;Visual/perceptual remediation/compensation;Coping strategies training;Patient/family education;Passive range of motion;Psychosocial skills training;Energy conservation;Functional Mobility Training    Consulted and Agree with Plan of Care Patient;Family member/caregiver    Family Member Consulted Caregiver Ch[place]           Patient will benefit from skilled therapeutic intervention in order to improve the following deficits and impairments:   Body Structure / Function / Physical Skills: UE functional use,Coordination,FMC,Dexterity,Strength,ROM Cognitive Skills: Attention,Memory,Emotional,Problem Solve,Safety Awareness     Visit Diagnosis: Muscle weakness (generalized)  Other lack of coordination    Problem List Patient Active Problem List   Diagnosis Date Noted  . Sepsis ([place]) 10/18/2018  . Long term (current) use of anticoagulants 05/01/2018  . History of stroke 04/11/2018  . Hyperlipidemia 07/04/2017  . Chronic diarrhea 06/12/2017  . 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 Fatigue 02/06/2015  . Visual changes 06/19/2013  . Benign prostatic hyperplasia 01/07/2013  . Depression 01/07/2013  . Restless legs syndrome 01/07/2013  . Chronic kidney disease, stage 3 unspecified ([place]) 12/20/2011  . CAD (coronary artery disease) 11/03/2010  . Hypertension 11/03/2010  . PAD (peripheral artery disease) ([place]) 11/03/2010  . SLE (systemic lupus erythematosus) ([place]) 11/03/2010    Harrel Carina, MS, OTR/L 07/14/2020, 1:58 PM  [place]  [place], [place], [address] Phone: [phone]   Fax:  [phone]  Name: MANAV PIEROTTI MRN: [id number] Date of Birth: 06-24-1945

## 2020-07-15 ENCOUNTER — Ambulatory Visit (INDEPENDENT_AMBULATORY_CARE_PROVIDER_SITE_OTHER): Payer: Medicare Other | Admitting: Urology

## 2020-07-15 ENCOUNTER — Encounter: Payer: Self-pay | Admitting: Urology

## 2020-07-15 ENCOUNTER — Other Ambulatory Visit: Payer: Self-pay

## 2020-07-15 VITALS — BP 97/67 | HR 59 | Ht 77.0 in | Wt 248.0 lb

## 2020-07-15 DIAGNOSIS — R300 Dysuria: Secondary | ICD-10-CM | POA: Diagnosis not present

## 2020-07-15 DIAGNOSIS — N529 Male erectile dysfunction, unspecified: Secondary | ICD-10-CM | POA: Diagnosis not present

## 2020-07-15 LAB — BLADDER SCAN AMB NON-IMAGING

## 2020-07-15 NOTE — Therapy (Addendum)
[place] MAIN [facility] SERVICES [address] [place], [place], [address] Phone: [phone]   Fax:  [phone]  Physical Therapy Treatment/Recertification for dates 07/14/2020- 10/06/2020  Patient Details  Name: Kenneth Summers MRN: [id number] Date of Birth: [birth year] No data recorded  Encounter Date: 07/14/2020     Past Medical History:  Diagnosis Date   Actinic keratosis    Alcohol abuse    APS (antiphospholipid syndrome) (HCC)    Basal cell carcinoma    BPH (benign prostatic hyperplasia)    CAD (coronary artery disease)    Cellulitis    Cervical spinal stenosis    Chronic osteomyelitis (HCC)    CKD (chronic kidney disease)    CKD (chronic kidney disease)    Clostridium difficile diarrhea    Depression    Diplopia    Fatigue    GERD (gastroesophageal reflux disease)    Hyperlipemia    Hypertension    Hypovitaminosis D    IBS (irritable bowel syndrome)    IgA deficiency (HCC)    Insomnia    Left lumbosacral radiculopathy    Moderate obstructive sleep apnea    Osteomyelitis of foot (HCC)    PAD (peripheral artery disease) (HCC)    Pruritus    RA (rheumatoid arthritis) (HCC)    Radiculopathy    Restless legs syndrome    RLS (restless legs syndrome)    SLE (systemic lupus erythematosus related syndrome) ([place])    Squamous cell carcinoma of skin 01/27/2019   right mid lower ear helix   Stroke ([place])    Toxic maculopathy of both eyes     Past Surgical History:  Procedure Laterality Date   CARDIAC CATHETERIZATION     CAROTID ENDARTERECTOMY     CERVICAL LAMINECTOMY     CORONARY ANGIOPLASTY     FRACTURE SURGERY     fusion C5-6-7     HERNIA REPAIR     KNEE ARTHROSCOPY     TONSILLECTOMY      There were no vitals filed for this visit.    Reassess goals- See goal section for detals.   Transfer training:   Patient was max assist with Stand pivot transfer today- requiring min assist to scoot forward. Max VC for hand  placement and transfer technique.   Once in sitting- patient performed static sitting at edge of mat with min assist of 1 person (caregiver) sitting behind him to keep him from falling backward.   Patient performed sitting at edge of mat while dynamically throwing basketball into hoop with right hand (16 shots) then switch to left hand (16 shots). Patient with more difficulty with upright posture yet did improve with practice today and performed 3 trials and sit upright for approx 20 min. * Patient did exhibit fair trunk posture/stability with constant CGA to min assist to maintain while shooting ball into basket.   Patient then performed sit to supine with min/mod A and max VC for techniques. While in supine he worked on rolling left and right and able to perform with very minimal assist. While in sidelye - he performed thoracic rotation x 20 reps each side as well as Sidelye hip ER x 10 reps each leg.  He then was able to negotiate legs off the bed with CGA and min assist with Upper trunk to sit back up. He was able to transfer back to power chair with Max Assist today.   Clinical Impression. Patient able to stand well and perform transfer with max  assist of 1 person. He was able to participate well with good attention today with basketball toss- working on dynamic sitting balance. He was fatigued yet no listing to left today and able to improve with ball toss with practice. He was also able to demo good rolling with [place] today. Overall, Patient has limited progress but during this cert he has demonstrated ability to stand > 2 min although his performances have not been consistent. He also progressed to standing with use of walker vs. Parallel bars. [name] condition has the potential to improve in response to therapy. Maximum improvement is yet to be obtained. The anticipated improvement is attainable and reasonable in a generally predictable time.  Patient reports   He will continue to benefit  from skilled PT services to improve his functional strength and postural control for improved transfers/standing endurance                            PT Short Term Goals - 07/14/20 0721       PT SHORT TERM GOAL #1   Title Be independent with initial home exercise program for self-management of symptoms.    Baseline HEP to be given at second session; advice to practice unsupported sitting at home (06/19/2018); 07/08/18: Performing at home, 6/29: needs assistance but is doing exercise program regularly; 08/22/18: adherent;    Time 2    Period Weeks    Status Achieved    Target Date 06/26/18      PT SHORT TERM GOAL #2   Title Patient will perform sit to stand transfer with mod Assist in parallel bars for improved transfer ability with decreased caregiver assist.    Baseline 04/19/2020- Max assist of 1 person. 07/14/2020- Patient continues to require Max assist for sit to stand transfers    Time 6    Period Weeks    Status On-going    Target Date 08/25/20               PT Long Term Goals - 07/21/20 0723       PT LONG TERM GOAL #1   Title Patient will complete all bed mobility with min A to improve functional independence for getting in and out of bed and adjusting in bed.     Baseline max A - total A reported by family (06/19/2018); 07/08/18: still requires maxA, dtr reports improvement in rolling since starting therapy; 6/23: min A to supervision in clinic; not doing at home right; 8/5: Pt's daughter states that his rolling has improved, but still has difficulty with all other bed mobility; 10/23/18: pts daughter states that she is doing about 35% of the work if he is in proper position, he does uses the bed rails to help roll;  using Harrel Lemon for coming to sit EOB; 10/12: pts daughter states that she is doing about 30% of the work if he is in proper position, he does uses the bed rails to help roll, but it is improving;  using Harrel Lemon for coming to sit EOB, 10/29: min A for  sit to sidelying, rolling supervision, able to transition sidelying to sit with HHA; 12/21: min-mod A. 03/01/2020- Patient presents improving bed mobility with current ability to roll to left with CGA and verbal and tactile cues and min assist to roll to right as seen by recent visits. 03/31/2020- Patient and caregiver report that he has maintained ability to roll side to side with only minimal assistance.  Time 12    Period Weeks    Status Achieved      PT LONG TERM GOAL #2   Title Patient will complete sit <> stand transfer chair to chair with Min A and LRAD to improve functional independence for household and community mobility.     Baseline min A for STS, still requires elevated surface height to 27".  03/01/20- Patient currently unable to perform a chair to chair transfer and continues to be transferred using hoyer lift. Patient has been training with sit to stand at parallel bars and able to currently perform sit to stand with max assist  from 27in. high surface.03/31/2020- Patient remains at [facility] assist with sit to stand transfer and continued use of hoyer lift transfer for all chair to chair or bed to chair transfers. 04/19/2020- Patient requires Max assist with sit to stand transfers and requires hoyer for chair to chair transfers. 07/14/2020- Patient has continued to require max assist since Feb for sit to stand transfers- Do not anticipate that this is a realistic goal at this time.    Time 12    Period Weeks    Status Not Met    Target Date --   Goal no longer appropriate.     PT LONG TERM GOAL #3   Title Patient will navigate power w/c with min A x 100 feet to improve mobility for household and short community distances.    Baseline required total A (06/12/2018); able to roll 20 feet with min A (06/20/2018); 07/08/18: Pt can go approximately 10' without assist per family;  10/23/2018: pt's daughter states that his power WC mobility has become 100% better with the new hand control, slowed speed, and  with practice; 10/12: pt's daughter reports that he can perform household WC mobility and limited community ambulation with minA    Time 12    Period Weeks    Status Achieved      PT LONG TERM GOAL #4   Title Patient will complete car transfers W/C to family SUV with min A using LRAD to improve his abilty to participate in community activities.     Baseline unable to complete car transfers at all (06/12/2018); unable to complete car transfers at all (06/19/2018).; 07/08/18: Unable to perform at this time; 09/11/18: unable to perform at this time; 10/23/2018: unable to complete car transfers at all; 10/12:  unable to complete car transfers at all, 10/29: unable; 01/13/19: unable; 2/11: min-mod A +2, 3/10: min A+1, 12/21: mod A. 03/01/2020- Patient unable to complete car transfers and continues to require medical transportation at this time. 3/14- Patient reports he may be purchasing a conversion van later this week.07/14/2020- Patient did not purchase the conversion Lucianne Lei and continues to require medical transport for all outings using his wheelchair.    Time 12    Period Weeks    Status Deferred    Target Date 10/06/20      PT LONG TERM GOAL #5   Title Patient will ambulate at least 10 feet with min A using LRAD to improve mobility for household and community distances.    Baseline unable to take steps (06/12/2018); able to shift R leg forward and back with max A and RW at edge of plinth (06/19/2018); 07/08/18: unable to ambulate at this time. 09/11/18: unable to perform at this time; 10/23/2018: unable to perform at this time; 10/12: unable to perform at this time, 10/29: unable at this time; 01/13/19: unable at this time, 2/11: min A  to step with RLE, unable to step with LLE, 3/10: able to take 2 steps in parallel bars with min A for safety; 4/1: min A for walking 10 feet in parallel bars, 5/24: min A walking 10 feet in parallel bars; 6/21: no change 7/14: no change. 03/01/2020- Patient unable to ambulate and currently  focusing on static standing. He was able to take a forward step with his right LE last week but unable today. 03/31/2020- Patient continues to be non-ambulatory but has been able to move right LE forward with standing but unable to take a step with left LE. 04/19/2020- Goal is still not appropriate as of yet- cont to focus on static and dynamic standing balance/pregait activities. 07/14/2020- Goal still not appropriate- working on ability to dynamically weight shift for transfers and continuing with static transfers.    Time 12    Period Weeks    Status Deferred    Target Date 10/06/20      Additional Long Term Goals   Additional Long Term Goals Yes      PT LONG TERM GOAL #6   Title Patient will be able to safety navigate up/down ramp with power chair, exhibiting good safety awareness, mod I to safely enter/exit his home.    Baseline 2/11: supervision with intermittent min A;4/1: supervision; 5/24: supervision. 03/01/2020- patient reports that he is able to navigate down the ramp at home to leave the home but continues to need assist to navigate up the ramp into the home. 07/14/2020- Patient reports able to negotiate w/c in/out of home.    Time 12    Period Weeks    Status Achieved      PT LONG TERM GOAL #7   Title Patient will increase functional reach test to >15 inches in sitting to exhibit improved sitting balance and positioning and reduce fall risk;    Baseline 07/30/18: 12 inches; 09/11/18: 10-12 inches; 10/23/18: 12-13 inch; 10/12: 14.5", 10/29: 15.5 inch    Time 4    Period Weeks    Status Achieved      PT LONG TERM GOAL #8   Title Patient will increase LLE quad strength to 3+/5 to improve functional strength for standing and mobility;    Baseline 10/28: 2+/5; 01/13/19: 2+/5, 2/11: 3-/5, 04/16/19: 3-/5, 05/08/19: 3-/5, 5/24: 3-/5, 6/21: 3-/5, 7/14: 3-/5, 12/21: 3-/5. 03/01/2020- Left quad= 3-/5 (focusing on quad strengthening during visits and with home program). 03/31/2020- left quad = 3-/5 with  MMT. 04/19/2020= 3- /5 with left quad strength with manual muscle testing. 07/14/2020- Patient presents with 3-/5 and quickly fatigues with knee ext yet functionall able to stand of left LE without knee buckling- yet unable to terminally extend left knee with static standing.    Time 12    Period Weeks    Status On-going    Target Date 10/06/20      PT LONG TERM GOAL  #9   TITLE Patient will report no vertigo with provoking motions or positions in order to be able to perform ADLs and mobility tasks without symptoms.    Baseline 06/30/19: reports no dizziness in last week; 6/21: one episode this weekend; 7/14: experiencing one a week; 03/01/2020.- Patient has denied any dizziness for over past 3 weeks.    Time 12    Period Weeks    Status Achieved      PT LONG TERM GOAL  #10   TITLE Patient will demonstrate improved functional LE/Postural strength as seen by ability to perform static  standing for 2 min or greater with max assist  to assist with strength required for ADL's and transfers/pregait/gait activities.    Baseline 03/01/2020- Patient able to stand approx 40 sec today with max assist in parallel bars. 03/31/2020- Patient able to demo static stand with max A for 75min 47 sec today. 04/19/2020- Patient able to demo 2 min today with mod/max assist - yet remains inconsistent. 07/14/2020- Did not attempt today but during past visits during this cert- patient has been inconsistent standing for 30 sec to 2 min. Will keep goal active to strive for more consistency to help with strength for transfers.    Time 12    Period Weeks    Status On-going    Target Date 10/06/20      PT LONG TERM GOAL  #11   TITLE Patient will demonstrate improved static sitting- able to sit unsupported at back for 5 min without evidence of listing to left for improved sitting and postural control to prevent excessive pressure through left hip/arm with sitting.    Baseline 07/14/2020- Patient presents with progressive listing to  left side over past month increasing risk for skin breakdown, pain in low back, and long term postural abnormalites.    Time 12    Period Weeks    Status New    Target Date 10/06/20                     Patient will benefit from skilled therapeutic intervention in order to improve the following deficits and impairments:  Abnormal gait, Decreased activity tolerance, Decreased cognition, Decreased endurance, Decreased knowledge of use of DME, Decreased range of motion, Decreased skin integrity, Decreased strength, Impaired perceived functional ability, Impaired sensation, Impaired UE functional use, Improper body mechanics, Pain, Cardiopulmonary status limiting activity, Decreased balance, Decreased coordination, Decreased mobility, Difficulty walking, Impaired tone, Postural dysfunction, Dizziness  Visit Diagnosis: Abnormality of gait and mobility  Difficulty in walking, not elsewhere classified  Muscle weakness (generalized)  Other abnormalities of gait and mobility     Problem List Patient Active Problem List   Diagnosis Date Noted   Sepsis ([place]) 10/18/2018   Long term (current) use of anticoagulants 05/01/2018   History of stroke 04/11/2018   Hyperlipidemia 07/04/2017   Chronic diarrhea 06/12/2017   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 [place],  [place], [address] Phone: [phone]   Fax:  [phone]  Name: Kenneth Summers MRN: [id number] Date of Birth: 02/09/1945

## 2020-07-15 NOTE — Progress Notes (Signed)
07/15/20 2:40 PM   Fabio Neighbors May 06, 1945 [id number]  CC: Urinary incontinence, ED  HPI: I saw Mr. Kenneth Summers in urology clinic today for the above issues.  He is an extremely comorbid 75 year old male who is here with his wife and daughter.  He has a history of multiple strokes, as well as lupus.  He also has a distant history of TURP over 10 years ago, and those records are not available to me.  He is wheelchair-bound and cannot ambulate secondary to left leg weakness.  It sounds like he has significant urgency and frequency of urination, and voids into a depends as he is not able to make it to the restroom secondary to mobility issues.  He leaks into the depends overnight.  They previously tried a condom catheter but he developed a UTI with this.  He is not on any prostate or bladder medications at this time.  They deny that he has had problems with recurrent UTIs.  His daughter reports he has had some dark urine, but no definite gross hematuria.  No recent urinalyses to review.  He also reports inability to obtain erection since his stroke 2 to 3 years ago.  He is interested if he will be able to get erections again.  Most recent renal function normal with creatinine 1.2, EGFR greater than 60.  No recent abdominal imaging to review.  PVR mildly elevated today at 240 mL, but he does not have the urge to void and last urinated a few hours ago.  PMH: Past Medical History:  Diagnosis Date   Actinic keratosis    Alcohol abuse    APS (antiphospholipid syndrome) (HCC)    Basal cell carcinoma    BPH (benign prostatic hyperplasia)    CAD (coronary artery disease)    Cellulitis    Cervical spinal stenosis    Chronic osteomyelitis (HCC)    CKD (chronic kidney disease)    CKD (chronic kidney disease)    Clostridium difficile diarrhea    Depression    Diplopia    Fatigue    GERD (gastroesophageal reflux disease)    Hyperlipemia    Hypertension    Hypovitaminosis D    IBS  (irritable bowel syndrome)    IgA deficiency (HCC)    Insomnia    Left lumbosacral radiculopathy    Moderate obstructive sleep apnea    Osteomyelitis of foot (HCC)    PAD (peripheral artery disease) (HCC)    Pruritus    RA (rheumatoid arthritis) (HCC)    Radiculopathy    Restless legs syndrome    RLS (restless legs syndrome)    SLE (systemic lupus erythematosus related syndrome) (HCC)    Squamous cell carcinoma of skin 01/27/2019   right mid lower ear helix   Stroke ([place])    Toxic maculopathy of both eyes     Surgical History: Past Surgical History:  Procedure Laterality Date   CARDIAC CATHETERIZATION     CAROTID ENDARTERECTOMY     CERVICAL LAMINECTOMY     CORONARY ANGIOPLASTY     FRACTURE SURGERY     fusion C5-6-7     HERNIA REPAIR     KNEE ARTHROSCOPY     TONSILLECTOMY        Family History: Family History  Problem Relation Age of Onset   COPD Mother    Osteoporosis Mother    Stroke Father    Osteoporosis Sister    Lupus Niece     Social History:  reports that  he has quit smoking. His smoking use included cigarettes. He has a 20.00 pack-year smoking history. He has never used smokeless tobacco. He reports previous alcohol use. He reports that he does not use drugs.  Physical Exam: BP 97/67   Pulse (!) 59   Ht $R'6\' 5"'pV$  (1.956 m)   Wt 248 lb (112.5 kg)   BMI 29.41 kg/m    Constitutional: In reclining wheelchair, conversational   Laboratory Data: Reviewed, see HPI  Pertinent Imaging: None to review  Assessment & Plan:   Extremely comorbid 75 year old male with history of lupus and stroke who was referred for urinary frequency and incontinence, as well as erectile dysfunction.  Renal function is normal with creatinine of 1.2.  He is currently satisfied with quality of life from his urinary symptoms.  We discussed possible etiologies including BPH with overflow versus overactive bladder.  With his history of stroke and poor mobility in a wheelchair, this is  likely a big contributor.  We discussed the concept of neurogenic bladder and need for monitoring for hydronephrosis and worsening renal function.  I also recommended checking a urinalysis to rule out any microscopic hematuria or infection.  He was not able to void today and refused catheterization.  They will bring in a urine sample later this week to look for any microscopic hematuria or infection.  I also recommended a renal/bladder ultrasound to evaluate for any hydronephrosis or stones.  Regarding his erectile dysfunction, we discussed options like Cialis or Viagra or even penile injections.  His wife was very concerned about addressing the erectile dysfunction when he is still not able to even walk, and I think it is reasonable to hold off on any treatment for his ED at this time.  The patient and his wife are in agreement with this.    Call with urinalysis and renal ultrasound results Likely yearly follow-up with renal ultrasound/BMP   I spent 60 total minutes on the day of the encounter including pre-visit review of the medical record, face-to-face time with the patient, and post visit ordering of labs/imaging/tests.  Nickolas Madrid, MD 07/15/2020  [facility] [address], [place], [place] [address] [phone]

## 2020-07-15 NOTE — Addendum Note (Signed)
Addended by: Donalee Citrin on: 07/15/2020 04:13 PM   Modules accepted: Orders

## 2020-07-19 ENCOUNTER — Ambulatory Visit: Payer: Medicare Other | Admitting: Occupational Therapy

## 2020-07-19 ENCOUNTER — Ambulatory Visit: Payer: Medicare Other

## 2020-07-19 ENCOUNTER — Encounter: Payer: Self-pay | Admitting: Occupational Therapy

## 2020-07-19 ENCOUNTER — Other Ambulatory Visit: Payer: Self-pay

## 2020-07-19 DIAGNOSIS — R269 Unspecified abnormalities of gait and mobility: Secondary | ICD-10-CM | POA: Diagnosis not present

## 2020-07-19 DIAGNOSIS — R262 Difficulty in walking, not elsewhere classified: Secondary | ICD-10-CM

## 2020-07-19 DIAGNOSIS — M6281 Muscle weakness (generalized): Secondary | ICD-10-CM

## 2020-07-19 DIAGNOSIS — R278 Other lack of coordination: Secondary | ICD-10-CM

## 2020-07-19 DIAGNOSIS — R2689 Other abnormalities of gait and mobility: Secondary | ICD-10-CM

## 2020-07-19 NOTE — Therapy (Signed)
[place] MAIN [facility] SERVICES [address] [place], [place], [address] Phone: [phone]   Fax:  [phone]  Occupational Therapy Treatment  Patient Details  Name: Kenneth Summers MRN: [id number] Date of Birth: 20-Jan-1946 No data recorded  Encounter Date: 07/19/2020   OT End of Session - 07/19/20 1354     Visit Number 74    Number of Visits 14    Date for OT Re-Evaluation 08/16/20    Authorization Type Progress report period starting 05/31/2020    OT Start Time 1345    OT Stop Time 1430    OT Time Calculation (min) 45 min    Activity Tolerance Patient tolerated treatment well    Behavior During Therapy WFL for tasks assessed/performed             Past Medical History:  Diagnosis Date   Actinic keratosis    Alcohol abuse    APS (antiphospholipid syndrome) (HCC)    Basal cell carcinoma    BPH (benign prostatic hyperplasia)    CAD (coronary artery disease)    Cellulitis    Cervical spinal stenosis    Chronic osteomyelitis (HCC)    CKD (chronic kidney disease)    CKD (chronic kidney disease)    Clostridium difficile diarrhea    Depression    Diplopia    Fatigue    GERD (gastroesophageal reflux disease)    Hyperlipemia    Hypertension    Hypovitaminosis D    IBS (irritable bowel syndrome)    IgA deficiency (HCC)    Insomnia    Left lumbosacral radiculopathy    Moderate obstructive sleep apnea    Osteomyelitis of foot (HCC)    PAD (peripheral artery disease) (HCC)    Pruritus    RA (rheumatoid arthritis) (HCC)    Radiculopathy    Restless legs syndrome    RLS (restless legs syndrome)    SLE (systemic lupus erythematosus related syndrome) ([place])    Squamous cell carcinoma of skin 01/27/2019   right mid lower ear helix   Stroke ([place])    Toxic maculopathy of both eyes     Past Surgical History:  Procedure Laterality Date   CARDIAC CATHETERIZATION     CAROTID ENDARTERECTOMY     CERVICAL LAMINECTOMY      CORONARY ANGIOPLASTY     FRACTURE SURGERY     fusion C5-6-7     HERNIA REPAIR     KNEE ARTHROSCOPY     TONSILLECTOMY      There were no vitals filed for this visit.   Subjective Assessment - 07/19/20 1353     Subjective  Pt reports he dislikes the heat today, misses swimming.    Patient is accompanied by: Family member    Pertinent History Pt. is a 75 y.o. 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 Pt. PMHx includes: Multiple Falls, Lupus, DJD, Renal Abscess, CVA. Pt. resides with his wife. Pt.'s wife and daughter assist with ADLs. Pt. has caregivers in  for 2 hours a day, 6 days a week. Pt. received Rehab services in acute care, at SNF for STR, and [facility]. Pt. is retired from [employer] for [employer] and [employer].    Patient Stated Goals To regain use of his left UE, and do more for himself.    Currently in Pain? Yes    Pain Score 8     Pain Location Shoulder    Pain Orientation Right  Pain Descriptors / Indicators Aching    Pain Type Chronic pain    Pain Onset Today    Pain Onset Today              Therapeutic Exercise Pt. worked on BUE strengthening, and reciprocal motion using the UBE while seated for 8 min. with no resistance.  Pt. performed 2.5# dowel for UE strengthening secondary to weakness. Pt. Required hand over hand assist for LUE gripping - assist to place hands, pt maintains with limited tolerance to maintain, requires multiple readjustments. Bilateral shoulder flexion, chest press, and circular patterns, were performed for 1-2 sets 10-20 reps each with cues to keep the dowel symmetrical, and level. Pt. worked on 3# dumbbell ex. for right  wrist flexion and extension, and supination/pronation 2# for the left. Pt reported increased pain to L shoulder with shoulder flexion + horizontal abduction, decreased reps to rest R shoulder.              OT Education - 07/19/20 1354     Education Details  strengthening, UE functioning    Person(s) Educated Patient;Caregiver(s)    Methods Explanation;Demonstration;Handout    Comprehension Returned demonstration;Tactile cues required;Verbalized understanding;Need further instruction                 OT Long Term Goals - 07/12/20 1408       OT LONG TERM GOAL #1   Title Pt. will increase left shoulder flexion AROM by 10 degrees to access cabinet/shelf.    Baseline 05/31/2020: Left shoulder flexion 119 degrees. Pt. is able to reach into cabinets with more ease, however has difficulty getting close enough to them with his w/c.Pt. continues to present with limited left shoulder ROM limitng access to cabinets, and shelves.03/01/2020: Left shoulder 116 degrees with new onset of numbness radiating from the left side of the neck to the hand. Pt. continues to progress with left shoulder ROM .Shouler flexion 130, and continues to work on reaching with increasing emphasis on flexing trunk to improve functional reach. 200th visit: shoulder flexion to 130 with effort    Time 12    Period Weeks    Status On-going    Target Date 08/16/20      OT LONG TERM GOAL #2   Title Pt. will donn a shirt with Supervision.    Baseline 05/31/2020: [place]. Pt. is assisting more with UE dressing at home. Pt. continues to require modA donning a zip down jacket with consistent cues to initiate the correct direction of the jacket.  Min-modA mahaging the zipper. MinA for donning a T-shirt with assist to roll side to side to pull the shirt down over his back. 200th visit: continues to require min assist at times for shirt/jacket    Time 12    Period Weeks    Status On-going    Target Date 08/16/20      OT LONG TERM GOAL #3   Title Pt. will require ModA to perform LE dressing    Period Weeks      OT LONG TERM GOAL #4   Title Pt. will improve left grip strength by 10# to be able to open a jar/container.    Baseline Grip strength continues to be limited. Decreased left sided  grip strength today secondary to a new onset of numbenss, and weakness radiating from the neck to the left hand. 200th visit: Pt. continues to have a harder time opening a wide mouthed jar, however is able to open a smaller jar  easier.    Time 12    Period Weeks    Status On-going    Target Date 08/16/20      OT LONG TERM GOAL #5   Title Pt. will improve left hand [facility] skills to be able to assist with buttoning/zipping.    Baseline 05/24/2020: Pt. is improving with fastening small buttons. Pt. continues to work on improving [facility] skills  to improve buttoning , and zipping.03/01/2020: Pt.has had a new onset of weakness, and numbness in the left hand beginning upon waking up this morning which has resulted in a significant change in [facility] skills.  Pt. has improved overall donning a jacket, and  manipulating the zipper on his jacket, however continues to require practice depending on the type of zipper jacket. Pt. continues to work on improving bilateral [facility] skills for buttoning/zipping.  200th visit: Pt. has improved with buttons and zippers, however has difficulty manipulating buttons up higher on the shirt.    Time 12    Period Weeks    Status Achieved      OT LONG TERM GOAL #6   Title Pt. will demonstrate visual conmpensatory strategies for 100% of the time during ADLs    Baseline Improving, however Pt. continues to require intermittent cues cues for left sides awareness.    Time 12    Period Weeks    Status On-going    Target Date 08/16/20      OT LONG TERM GOAL #7   Title Pt. will prepare a simple cold snack from w/c with supervision using cognitive compensatory strategies 100% of the time.    Baseline 200th visit: Pt. has difficulty with getting the w/c close enough to access the refrigerator, and counters    Time 12    Period Weeks    Status Deferred      OT LONG TERM GOAL #8   Title Pt. will accurately identify potential safety hazard using good safety awareness, and judgement 100% for  ADLs, and IADLs.    Baseline 200th visit: Pt. continues to require cues    Time 12    Period Weeks    Status On-going    Target Date 08/16/20      OT LONG TERM GOAL  #9   TITLE Pt. will  navigate his w/c around obstacles with Supervision and 100% accuracy    Baseline Pt. has progressed, however, continues to require cuing for obstacles on the left with fewer  cues overall. Pt. requires significant cues to engage his left UE during tasks.. 200th visit: continues to bump into walls at home with the w/ct at times    Time 12    Period Weeks    Status On-going    Target Date 08/16/20      OT LONG TERM GOAL  #10   TITLE Pt. will independently be able  to reach to place items into cabinetry, and closets.    Baseline 200th visit: Pt. continues to have difficulty with functional reaching continues to be limited.    Time 12    Period Weeks    Status On-going    Target Date 08/16/20      OT LONG TERM GOAL  #11   TITLE Pt. will increase BUE strength by 64mm grades in preparation for ADL transfers.    Baseline 05/31/2020: Pt. is progressing, however BUE strength continues to be limited. Pt. is able to tolerate increased resistive weights.    Time 12    Period Weeks  Status On-going    Target Date 08/16/20      OT LONG TERM GOAL  #12   TITLE Pt. will improve LUE functional reaching with the left engaging the trunk 100% of the time during ADL tasks with minimal cues.    Baseline 200th visit: Pt. is limited with LUE functional reaching.    Time 12    Period Weeks    Status On-going    Target Date 08/16/20                   Plan - 07/19/20 1355     Clinical Impression Statement Pt. continues to make steady progress with BUE strength, and tolerance for there. ex. Pt. requires cues to attend to task, during UBE pt unable to perform backwards motion without MAX cues. Pt. continues to require consistent verbal, visual, and tactile cues. Pt. required a towel roll today for lateral  support. Pt. continues to require less cues to perform UE strengthening exercises, technique, and form as well as visual demonstration prior to the exercises. Pt. continues to work on improving BUE strength, and [facility] skills in order to improve LUE functioning during ADLs, IADLs, and tranfers.    OT Occupational Profile and History Problem Focused Assessment - Including review of records relating to presenting problem    Occupational performance deficits (Please refer to evaluation for details): ADL's    Body Structure / Function / Physical Skills UE functional use;Coordination;FMC;Dexterity;Strength;ROM    Cognitive Skills Attention;Memory;Emotional;Problem Solve;Safety Awareness    Rehab Potential Good    Clinical Decision Making Several treatment options, min-mod task modification necessary    Comorbidities Affecting Occupational Performance: Presence of comorbidities impacting occupational performance    Modification or Assistance to Complete Evaluation  No modification of tasks or assist necessary to complete eval    OT Frequency 2x / week    OT Duration 12 weeks    OT Treatment/Interventions Self-care/ADL training;DME and/or AE instruction;Therapeutic exercise;Therapeutic activities;Moist Heat;Cognitive remediation/compensation;Neuromuscular education;Visual/perceptual remediation/compensation;Coping strategies training;Patient/family education;Passive range of motion;Psychosocial skills training;Energy conservation;Functional Mobility Training    Consulted and Agree with Plan of Care Patient;Family member/caregiver    Family Member Consulted Caregiver Ch[place]             Patient will benefit from skilled therapeutic intervention in order to improve the following deficits and impairments:   Body Structure / Function / Physical Skills: UE functional use, Coordination, FMC, Dexterity, Strength, ROM Cognitive Skills: Attention, Memory, Emotional, Problem Solve, Safety Awareness     Visit  Diagnosis: Muscle weakness (generalized)  Other lack of coordination    Problem List Patient Active Problem List   Diagnosis Date Noted   Sepsis ([place]) 10/18/2018   Long term (current) use of anticoagulants 05/01/2018   History of stroke 04/11/2018   Hyperlipidemia 07/04/2017   Chronic diarrhea 06/12/2017   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 OTR/L  07/19/20, 2:20 PM  ascom [phone]   [place] MAIN [facility] SERVICES [address] [place], [place], [address] Phone:  [phone]   Fax:  [phone]  Name: ROBERTT BUDA MRN: [id number] Date of Birth: April 05, 1945

## 2020-07-21 ENCOUNTER — Ambulatory Visit: Payer: Medicare Other | Admitting: Occupational Therapy

## 2020-07-21 ENCOUNTER — Other Ambulatory Visit: Payer: Self-pay

## 2020-07-21 ENCOUNTER — Ambulatory Visit: Payer: Medicare Other

## 2020-07-21 DIAGNOSIS — M6281 Muscle weakness (generalized): Secondary | ICD-10-CM

## 2020-07-21 DIAGNOSIS — R269 Unspecified abnormalities of gait and mobility: Secondary | ICD-10-CM

## 2020-07-21 DIAGNOSIS — R278 Other lack of coordination: Secondary | ICD-10-CM

## 2020-07-21 DIAGNOSIS — R262 Difficulty in walking, not elsewhere classified: Secondary | ICD-10-CM

## 2020-07-21 NOTE — Therapy (Signed)
[place] [facility] SERVICES [address] [place], [place], [address] Phone: [phone]   Fax:  [phone]  Physical Therapy Treatment/Physical Therapy Progress Note   Dates of reporting period   06/07/2020  to   07/19/2020   Patient Details  Name: Kenneth Summers MRN: [id number] Date of Birth: Mar 05, 1945 No data recorded  Encounter Date: 07/19/2020    Past Medical History:  Diagnosis Date   Actinic keratosis    Alcohol abuse    APS (antiphospholipid syndrome) (HCC)    Basal cell carcinoma    BPH (benign prostatic hyperplasia)    CAD (coronary artery disease)    Cellulitis    Cervical spinal stenosis    Chronic osteomyelitis (HCC)    CKD (chronic kidney disease)    CKD (chronic kidney disease)    Clostridium difficile diarrhea    Depression    Diplopia    Fatigue    GERD (gastroesophageal reflux disease)    Hyperlipemia    Hypertension    Hypovitaminosis D    IBS (irritable bowel syndrome)    IgA deficiency (HCC)    Insomnia    Left lumbosacral radiculopathy    Moderate obstructive sleep apnea    Osteomyelitis of foot (HCC)    PAD (peripheral artery disease) (HCC)    Pruritus    RA (rheumatoid arthritis) (HCC)    Radiculopathy    Restless legs syndrome    RLS (restless legs syndrome)    SLE (systemic lupus erythematosus related syndrome) ([place])    Squamous cell carcinoma of skin 01/27/2019   right mid lower ear helix   Stroke ([place])    Toxic maculopathy of both eyes     Past Surgical History:  Procedure Laterality Date   CARDIAC CATHETERIZATION     CAROTID ENDARTERECTOMY     CERVICAL LAMINECTOMY     CORONARY ANGIOPLASTY     FRACTURE SURGERY     fusion C5-6-7     HERNIA REPAIR     KNEE ARTHROSCOPY     TONSILLECTOMY      There were no vitals filed for this visit.   *All goals not directly assessed due to patient was just recertified last visit and not feeling great today.   Attempted sit to stand and  static standing- Patient required max assist to stand - left LE externally rotated and knee flexed with difficulty standing erect- Requiring max Assist for 50 sec and unable to ext hips to stand erect. Patient stated he was too fatigued to practice standing today.   He was listing more to left side in his power chair so treatment then focused on sitting posture. Manual stretching to left side to stretch tight left side/shoulder region. Patient presents with tightness and performed passive sidebending to right with left arm overhead- Firm end feel. Active forward sitting posture activities including pushing/pulling PVC pipe forward /backward(chest press/scap row)   AAROM - left hip flex/knee ext (minimal left knee ext today) and AROM right hip flex/knee ext- hold 20 sec x 5 reps.   Active reaching with each arm at various angles with minimal ability to move left LE today and difficulty maintaining sitting balance- Required assist to keep from sliding forward.   Patient required more rest breaks today due to fatigue today.   Clinical impression: Patient was very limited today- more lethargic and listing to left side excessively today. He denied any pain today but unable to progress in standing or transfers. [name] condition has the  potential to improve in response to therapy. Maximum improvement is yet to be obtained. The anticipated improvement is attainable and reasonable in a generally predictable time.                               PT Education - 07/21/20 0758     Education Details Sitting postural education- discussed possible  increased risk of skin breakdown due to listing to left side.    Person(s) Educated Patient;Child(ren)    Methods Explanation;Demonstration;Tactile cues;Verbal cues    Comprehension Verbalized understanding;Returned demonstration;Tactile cues required;Need further instruction              PT Short Term Goals - 07/14/20 8119        PT SHORT TERM GOAL #1   Title Be independent with initial home exercise program for self-management of symptoms.    Baseline HEP to be given at second session; advice to practice unsupported sitting at home (06/19/2018); 07/08/18: Performing at home, 6/29: needs assistance but is doing exercise program regularly; 08/22/18: adherent;    Time 2    Period Weeks    Status Achieved    Target Date 06/26/18      PT SHORT TERM GOAL #2   Title Patient will perform sit to stand transfer with mod Assist in parallel bars for improved transfer ability with decreased caregiver assist.    Baseline 04/19/2020- Max assist of 1 person. 07/14/2020- Patient continues to require Max assist for sit to stand transfers    Time 6    Period Weeks    Status On-going    Target Date 08/25/20               PT Long Term Goals - 07/21/20 0723       PT LONG TERM GOAL #1   Title Patient will complete all bed mobility with min A to improve functional independence for getting in and out of bed and adjusting in bed.     Baseline max A - total A reported by family (06/19/2018); 07/08/18: still requires maxA, dtr reports improvement in rolling since starting therapy; 6/23: min A to supervision in clinic; not doing at home right; 8/5: Pt's daughter states that his rolling has improved, but still has difficulty with all other bed mobility; 10/23/18: pts daughter states that she is doing about 35% of the work if he is in proper position, he does uses the bed rails to help roll;  using Harrel Lemon for coming to sit EOB; 10/12: pts daughter states that she is doing about 30% of the work if he is in proper position, he does uses the bed rails to help roll, but it is improving;  using Harrel Lemon for coming to sit EOB, 10/29: min A for sit to sidelying, rolling supervision, able to transition sidelying to sit with HHA; 12/21: min-mod A. 03/01/2020- Patient presents improving bed mobility with current ability to roll to left with CGA and verbal and tactile  cues and min assist to roll to right as seen by recent visits. 03/31/2020- Patient and caregiver report that he has maintained ability to roll side to side with only minimal assistance.    Time 12    Period Weeks    Status Achieved      PT LONG TERM GOAL #2   Title Patient will complete sit <> stand transfer chair to chair with Min A and LRAD to improve functional independence for household and community  mobility.     Baseline min A for STS, still requires elevated surface height to 27".  03/01/20- Patient currently unable to perform a chair to chair transfer and continues to be transferred using hoyer lift. Patient has been training with sit to stand at parallel bars and able to currently perform sit to stand with max assist  from 27in. high surface.03/31/2020- Patient remains at [facility] assist with sit to stand transfer and continued use of hoyer lift transfer for all chair to chair or bed to chair transfers. 04/19/2020- Patient requires Max assist with sit to stand transfers and requires hoyer for chair to chair transfers. 07/14/2020- Patient has continued to require max assist since Feb for sit to stand transfers- Do not anticipate that this is a realistic goal at this time.    Time 12    Period Weeks    Status Not Met    Target Date --   Goal no longer appropriate.     PT LONG TERM GOAL #3   Title Patient will navigate power w/c with min A x 100 feet to improve mobility for household and short community distances.    Baseline required total A (06/12/2018); able to roll 20 feet with min A (06/20/2018); 07/08/18: Pt can go approximately 10' without assist per family;  10/23/2018: pt's daughter states that his power WC mobility has become 100% better with the new hand control, slowed speed, and with practice; 10/12: pt's daughter reports that he can perform household WC mobility and limited community ambulation with minA    Time 12    Period Weeks    Status Achieved      PT LONG TERM GOAL #4   Title Patient  will complete car transfers W/C to family SUV with min A using LRAD to improve his abilty to participate in community activities.     Baseline unable to complete car transfers at all (06/12/2018); unable to complete car transfers at all (06/19/2018).; 07/08/18: Unable to perform at this time; 09/11/18: unable to perform at this time; 10/23/2018: unable to complete car transfers at all; 10/12:  unable to complete car transfers at all, 10/29: unable; 01/13/19: unable; 2/11: min-mod A +2, 3/10: min A+1, 12/21: mod A. 03/01/2020- Patient unable to complete car transfers and continues to require medical transportation at this time. 3/14- Patient reports he may be purchasing a conversion van later this week.07/14/2020- Patient did not purchase the conversion Lucianne Lei and continues to require medical transport for all outings using his wheelchair.    Time 12    Period Weeks    Status Deferred    Target Date 10/06/20      PT LONG TERM GOAL #5   Title Patient will ambulate at least 10 feet with min A using LRAD to improve mobility for household and community distances.    Baseline unable to take steps (06/12/2018); able to shift R leg forward and back with max A and RW at edge of plinth (06/19/2018); 07/08/18: unable to ambulate at this time. 09/11/18: unable to perform at this time; 10/23/2018: unable to perform at this time; 10/12: unable to perform at this time, 10/29: unable at this time; 01/13/19: unable at this time, 2/11: min A to step with RLE, unable to step with LLE, 3/10: able to take 2 steps in parallel bars with min A for safety; 4/1: min A for walking 10 feet in parallel bars, 5/24: min A walking 10 feet in parallel bars; 6/21: no change 7/14: no change.  03/01/2020- Patient unable to ambulate and currently focusing on static standing. He was able to take a forward step with his right LE last week but unable today. 03/31/2020- Patient continues to be non-ambulatory but has been able to move right LE forward with standing but  unable to take a step with left LE. 04/19/2020- Goal is still not appropriate as of yet- cont to focus on static and dynamic standing balance/pregait activities. 07/14/2020- Goal still not appropriate- working on ability to dynamically weight shift for transfers and continuing with static transfers.    Time 12    Period Weeks    Status Deferred    Target Date 10/06/20      Additional Long Term Goals   Additional Long Term Goals Yes      PT LONG TERM GOAL #6   Title Patient will be able to safety navigate up/down ramp with power chair, exhibiting good safety awareness, mod I to safely enter/exit his home.    Baseline 2/11: supervision with intermittent min A;4/1: supervision; 5/24: supervision. 03/01/2020- patient reports that he is able to navigate down the ramp at home to leave the home but continues to need assist to navigate up the ramp into the home. 07/14/2020- Patient reports able to negotiate w/c in/out of home.    Time 12    Period Weeks    Status Achieved      PT LONG TERM GOAL #7   Title Patient will increase functional reach test to >15 inches in sitting to exhibit improved sitting balance and positioning and reduce fall risk;    Baseline 07/30/18: 12 inches; 09/11/18: 10-12 inches; 10/23/18: 12-13 inch; 10/12: 14.5", 10/29: 15.5 inch    Time 4    Period Weeks    Status Achieved      PT LONG TERM GOAL #8   Title Patient will increase LLE quad strength to 3+/5 to improve functional strength for standing and mobility;    Baseline 10/28: 2+/5; 01/13/19: 2+/5, 2/11: 3-/5, 04/16/19: 3-/5, 05/08/19: 3-/5, 5/24: 3-/5, 6/21: 3-/5, 7/14: 3-/5, 12/21: 3-/5. 03/01/2020- Left quad= 3-/5 (focusing on quad strengthening during visits and with home program). 03/31/2020- left quad = 3-/5 with MMT. 04/19/2020= 3- /5 with left quad strength with manual muscle testing. 07/14/2020- Patient presents with 3-/5 and quickly fatigues with knee ext yet functionall able to stand of left LE without knee buckling- yet unable  to terminally extend left knee with static standing.    Time 12    Period Weeks    Status On-going    Target Date 10/06/20      PT LONG TERM GOAL  #9   TITLE Patient will report no vertigo with provoking motions or positions in order to be able to perform ADLs and mobility tasks without symptoms.    Baseline 06/30/19: reports no dizziness in last week; 6/21: one episode this weekend; 7/14: experiencing one a week; 03/01/2020.- Patient has denied any dizziness for over past 3 weeks.    Time 12    Period Weeks    Status Achieved      PT LONG TERM GOAL  #10   TITLE Patient will demonstrate improved functional LE/Postural strength as seen by ability to perform static standing for 2 min or greater with max assist  to assist with strength required for ADL's and transfers/pregait/gait activities.    Baseline 03/01/2020- Patient able to stand approx 40 sec today with max assist in parallel bars. 03/31/2020- Patient able to demo static stand with max  A for 35min 47 sec today. 04/19/2020- Patient able to demo 2 min today with mod/max assist - yet remains inconsistent. 07/14/2020- Did not attempt today but during past visits during this cert- patient has been inconsistent standing for 30 sec to 2 min. Will keep goal active to strive for more consistency to help with strength for transfers.    Time 12    Period Weeks    Status On-going    Target Date 10/06/20      PT LONG TERM GOAL  #11   TITLE Patient will demonstrate improved static sitting- able to sit unsupported at back for 5 min without evidence of listing to left for improved sitting and postural control to prevent excessive pressure through left hip/arm with sitting.    Baseline 07/14/2020- Patient presents with progressive listing to left side over past month increasing risk for skin breakdown, pain in low back, and long term postural abnormalites.    Time 12    Period Weeks    Status New    Target Date 10/06/20                     Patient will benefit from skilled therapeutic intervention in order to improve the following deficits and impairments:  Abnormal gait, Decreased activity tolerance, Decreased cognition, Decreased endurance, Decreased knowledge of use of DME, Decreased range of motion, Decreased skin integrity, Decreased strength, Impaired perceived functional ability, Impaired sensation, Impaired UE functional use, Improper body mechanics, Pain, Cardiopulmonary status limiting activity, Decreased balance, Decreased coordination, Decreased mobility, Difficulty walking, Impaired tone, Postural dysfunction, Dizziness  Visit Diagnosis: Abnormality of gait and mobility  Difficulty in walking, not elsewhere classified  Muscle weakness (generalized)  Other abnormalities of gait and mobility     Problem List Patient Active Problem List   Diagnosis Date Noted   Sepsis (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 ([place]) 04/27/2016   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   Fatigue 02/06/2015   Visual changes 06/19/2013   Benign prostatic hyperplasia 01/07/2013   Depression 01/07/2013   Restless legs syndrome 01/07/2013   Chronic kidney disease, stage 3 unspecified ([place]) 12/20/2011   CAD (coronary artery disease) 11/03/2010   Hypertension 11/03/2010   PAD (peripheral artery disease) ([place]) 11/03/2010   SLE (systemic lupus erythematosus) ([place]) 11/03/2010    Lewis Moccasin, PT 07/21/2020, 2:18 PM  [place] MAIN [facility] SERVICES [address] [place], [place], [address] Phone: [phone]   Fax:  [phone]  Name: Kenneth Summers MRN: [id number] Date of Birth: 04-03-1945

## 2020-07-21 NOTE — Addendum Note (Signed)
Addended by: Lewis Moccasin on: 07/21/2020 07:57 AM   Modules accepted: Orders

## 2020-07-22 NOTE — Therapy (Signed)
[place] MAIN [facility] SERVICES [address] [place], [place], [address] Phone: [phone]   Fax:  [phone]  Physical Therapy Treatment  Patient Details  Name: Kenneth Summers MRN: [id number] Date of Birth: 1945-05-19 No data recorded  Encounter Date: 07/21/2020   PT End of Session - 07/21/20 1428     Visit Number 251    Number of Visits 301    Date for PT Re-Evaluation 10/06/20    Authorization Type Medicare reporting period starting 09/11/18; NO FOTO    Authorization Time Period Recert 2/83/1517-07/08/6071; Recert 08/06/624-9/48/5462    PT Start Time 1300    PT Stop Time 1343    PT Time Calculation (min) 43 min    Equipment Utilized During Treatment Gait belt    Activity Tolerance No increased pain;Patient limited by fatigue;Patient tolerated treatment well    Behavior During Therapy [employer] for tasks assessed/performed             Past Medical History:  Diagnosis Date   Actinic keratosis    Alcohol abuse    APS (antiphospholipid syndrome) (HCC)    Basal cell carcinoma    BPH (benign prostatic hyperplasia)    CAD (coronary artery disease)    Cellulitis    Cervical spinal stenosis    Chronic osteomyelitis (HCC)    CKD (chronic kidney disease)    CKD (chronic kidney disease)    Clostridium difficile diarrhea    Depression    Diplopia    Fatigue    GERD (gastroesophageal reflux disease)    Hyperlipemia    Hypertension    Hypovitaminosis D    IBS (irritable bowel syndrome)    IgA deficiency (HCC)    Insomnia    Left lumbosacral radiculopathy    Moderate obstructive sleep apnea    Osteomyelitis of foot (HCC)    PAD (peripheral artery disease) (HCC)    Pruritus    RA (rheumatoid arthritis) (HCC)    Radiculopathy    Restless legs syndrome    RLS (restless legs syndrome)    SLE (systemic lupus erythematosus related syndrome) ([place])    Squamous cell carcinoma of skin 01/27/2019   right mid lower ear helix   Stroke ([place])     Toxic maculopathy of both eyes     Past Surgical History:  Procedure Laterality Date   CARDIAC CATHETERIZATION     CAROTID ENDARTERECTOMY     CERVICAL LAMINECTOMY     CORONARY ANGIOPLASTY     FRACTURE SURGERY     fusion C5-6-7     HERNIA REPAIR     KNEE ARTHROSCOPY     TONSILLECTOMY      There were no vitals filed for this visit.   Subjective Assessment - 07/21/20 1257     Subjective Patient reports some left arm heaviness and pain over past few weeks. Denies    Patient is accompained by: Family member    Pertinent History Patient is a 75 y.o. male who presents to outpatient physical therapy with a referral for medical diagnosis of CVA. This patient's chief complaints consist of left hemiplegia and overall deconditioning leading to the following functional deficits: dependent for ADLs, IADLs, unable to transfer to car, dependent transfers at home with hoyer lift, unable to walk or stand without significant assistance, difficulty with W/C navigation..    Limitations Sitting;Lifting;Standing;House hold activities;Writing;Walking;Other (comment)    Diagnostic tests Brain MRI 11/01/2017: IMPRESSION: 1. Acute right ACA territory infarct as demonstrated on prior CT imaging. No  intracranial hemorrhage or significant mass effect. 2. 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    Patient Stated Goals wants to be able to walk again    Pain Onset Today    Pain Onset Today              Interventions:   Transfer training: Patient required Max Assist to stand and pivot from lift chair to mat table- including verbal cues for positioning of trunk and LE/Ue's. Patient able to follow some cues but max difficulty scooting forward today.   Mat table exercises:  Static sit on edge of mat x 5 min focusing on UE support and maintaining erect sitting posture. Patient able to sit with close Supervision today and did not list to left or fall backward.   Patient  then performed sitting activities including head turns/nods while trying to maintain sitting posture. He then performed sitting activity using his hands with tray table placed in front of him and requested him to pick wooden circular blocks out of bucket and onto table then stack 3 high and then place back into bucket. Patient perform activity for 6 min - total time in sitting today = 11 min  *patient was very fatigued and unable to maintain sitting so positioned into supine and performed PROM to BLE - hamstrings, knee to chest, lower trunk rotation- hold 20 sec each direction.  Patient then performed rolling to left and then to right x 10 times each direction with only CGA.   Clinical Impression: Patient demonstrated much improved overall sitting posture vs. Last couple of visits today- les overall physical assist today and patient able to follow verbal cues well and limited only by fatigue today. He will continue to benefit from skilled PT services to improve his functional strength and postural control for improved transfers/standing endurance                          PT Short Term Goals - 07/14/20 9528       PT SHORT TERM GOAL #1   Title Be independent with initial home exercise program for self-management of symptoms.    Baseline HEP to be given at second session; advice to practice unsupported sitting at home (06/19/2018); 07/08/18: Performing at home, 6/29: needs assistance but is doing exercise program regularly; 08/22/18: adherent;    Time 2    Period Weeks    Status Achieved    Target Date 06/26/18      PT SHORT TERM GOAL #2   Title Patient will perform sit to stand transfer with mod Assist in parallel bars for improved transfer ability with decreased caregiver assist.    Baseline 04/19/2020- Max assist of 1 person. 07/14/2020- Patient continues to require Max assist for sit to stand transfers    Time 6    Period Weeks    Status On-going    Target Date 08/25/20                PT Long Term Goals - 07/21/20 0723       PT LONG TERM GOAL #1   Title Patient will complete all bed mobility with min A to improve functional independence for getting in and out of bed and adjusting in bed.     Baseline max A - total A reported by family (06/19/2018); 07/08/18: still requires maxA, dtr reports improvement in rolling since starting therapy; 6/23: min A to supervision in clinic; not  doing at home right; 8/5: Pt's daughter states that his rolling has improved, but still has difficulty with all other bed mobility; 10/23/18: pts daughter states that she is doing about 35% of the work if he is in proper position, he does uses the bed rails to help roll;  using Harrel Lemon for coming to sit EOB; 10/12: pts daughter states that she is doing about 30% of the work if he is in proper position, he does uses the bed rails to help roll, but it is improving;  using Harrel Lemon for coming to sit EOB, 10/29: min A for sit to sidelying, rolling supervision, able to transition sidelying to sit with HHA; 12/21: min-mod A. 03/01/2020- Patient presents improving bed mobility with current ability to roll to left with CGA and verbal and tactile cues and min assist to roll to right as seen by recent visits. 03/31/2020- Patient and caregiver report that he has maintained ability to roll side to side with only minimal assistance.    Time 12    Period Weeks    Status Achieved      PT LONG TERM GOAL #2   Title Patient will complete sit <> stand transfer chair to chair with Min A and LRAD to improve functional independence for household and community mobility.     Baseline min A for STS, still requires elevated surface height to 27".  03/01/20- Patient currently unable to perform a chair to chair transfer and continues to be transferred using hoyer lift. Patient has been training with sit to stand at parallel bars and able to currently perform sit to stand with max assist  from 27in. high surface.03/31/2020- Patient  remains at [facility] assist with sit to stand transfer and continued use of hoyer lift transfer for all chair to chair or bed to chair transfers. 04/19/2020- Patient requires Max assist with sit to stand transfers and requires hoyer for chair to chair transfers. 07/14/2020- Patient has continued to require max assist since Feb for sit to stand transfers- Do not anticipate that this is a realistic goal at this time.    Time 12    Period Weeks    Status Not Met    Target Date --   Goal no longer appropriate.     PT LONG TERM GOAL #3   Title Patient will navigate power w/c with min A x 100 feet to improve mobility for household and short community distances.    Baseline required total A (06/12/2018); able to roll 20 feet with min A (06/20/2018); 07/08/18: Pt can go approximately 10' without assist per family;  10/23/2018: pt's daughter states that his power WC mobility has become 100% better with the new hand control, slowed speed, and with practice; 10/12: pt's daughter reports that he can perform household WC mobility and limited community ambulation with minA    Time 12    Period Weeks    Status Achieved      PT LONG TERM GOAL #4   Title Patient will complete car transfers W/C to family SUV with min A using LRAD to improve his abilty to participate in community activities.     Baseline unable to complete car transfers at all (06/12/2018); unable to complete car transfers at all (06/19/2018).; 07/08/18: Unable to perform at this time; 09/11/18: unable to perform at this time; 10/23/2018: unable to complete car transfers at all; 10/12:  unable to complete car transfers at all, 10/29: unable; 01/13/19: unable; 2/11: min-mod A +2, 3/10: min A+1, 12/21:  mod A. 03/01/2020- Patient unable to complete car transfers and continues to require medical transportation at this time. 3/14- Patient reports he may be purchasing a conversion van later this week.07/14/2020- Patient did not purchase the conversion Lucianne Lei and continues to require  medical transport for all outings using his wheelchair.    Time 12    Period Weeks    Status Deferred    Target Date 10/06/20      PT LONG TERM GOAL #5   Title Patient will ambulate at least 10 feet with min A using LRAD to improve mobility for household and community distances.    Baseline unable to take steps (06/12/2018); able to shift R leg forward and back with max A and RW at edge of plinth (06/19/2018); 07/08/18: unable to ambulate at this time. 09/11/18: unable to perform at this time; 10/23/2018: unable to perform at this time; 10/12: unable to perform at this time, 10/29: unable at this time; 01/13/19: unable at this time, 2/11: min A to step with RLE, unable to step with LLE, 3/10: able to take 2 steps in parallel bars with min A for safety; 4/1: min A for walking 10 feet in parallel bars, 5/24: min A walking 10 feet in parallel bars; 6/21: no change 7/14: no change. 03/01/2020- Patient unable to ambulate and currently focusing on static standing. He was able to take a forward step with his right LE last week but unable today. 03/31/2020- Patient continues to be non-ambulatory but has been able to move right LE forward with standing but unable to take a step with left LE. 04/19/2020- Goal is still not appropriate as of yet- cont to focus on static and dynamic standing balance/pregait activities. 07/14/2020- Goal still not appropriate- working on ability to dynamically weight shift for transfers and continuing with static transfers.    Time 12    Period Weeks    Status Deferred    Target Date 10/06/20      Additional Long Term Goals   Additional Long Term Goals Yes      PT LONG TERM GOAL #6   Title Patient will be able to safety navigate up/down ramp with power chair, exhibiting good safety awareness, mod I to safely enter/exit his home.    Baseline 2/11: supervision with intermittent min A;4/1: supervision; 5/24: supervision. 03/01/2020- patient reports that he is able to navigate down the ramp at  home to leave the home but continues to need assist to navigate up the ramp into the home. 07/14/2020- Patient reports able to negotiate w/c in/out of home.    Time 12    Period Weeks    Status Achieved      PT LONG TERM GOAL #7   Title Patient will increase functional reach test to >15 inches in sitting to exhibit improved sitting balance and positioning and reduce fall risk;    Baseline 07/30/18: 12 inches; 09/11/18: 10-12 inches; 10/23/18: 12-13 inch; 10/12: 14.5", 10/29: 15.5 inch    Time 4    Period Weeks    Status Achieved      PT LONG TERM GOAL #8   Title Patient will increase LLE quad strength to 3+/5 to improve functional strength for standing and mobility;    Baseline 10/28: 2+/5; 01/13/19: 2+/5, 2/11: 3-/5, 04/16/19: 3-/5, 05/08/19: 3-/5, 5/24: 3-/5, 6/21: 3-/5, 7/14: 3-/5, 12/21: 3-/5. 03/01/2020- Left quad= 3-/5 (focusing on quad strengthening during visits and with home program). 03/31/2020- left quad = 3-/5 with MMT. 04/19/2020= 3- /5 with  left quad strength with manual muscle testing. 07/14/2020- Patient presents with 3-/5 and quickly fatigues with knee ext yet functionall able to stand of left LE without knee buckling- yet unable to terminally extend left knee with static standing.    Time 12    Period Weeks    Status On-going    Target Date 10/06/20      PT LONG TERM GOAL  #9   TITLE Patient will report no vertigo with provoking motions or positions in order to be able to perform ADLs and mobility tasks without symptoms.    Baseline 06/30/19: reports no dizziness in last week; 6/21: one episode this weekend; 7/14: experiencing one a week; 03/01/2020.- Patient has denied any dizziness for over past 3 weeks.    Time 12    Period Weeks    Status Achieved      PT LONG TERM GOAL  #10   TITLE Patient will demonstrate improved functional LE/Postural strength as seen by ability to perform static standing for 2 min or greater with max assist  to assist with strength required for ADL's and  transfers/pregait/gait activities.    Baseline 03/01/2020- Patient able to stand approx 40 sec today with max assist in parallel bars. 03/31/2020- Patient able to demo static stand with max A for 18min 47 sec today. 04/19/2020- Patient able to demo 2 min today with mod/max assist - yet remains inconsistent. 07/14/2020- Did not attempt today but during past visits during this cert- patient has been inconsistent standing for 30 sec to 2 min. Will keep goal active to strive for more consistency to help with strength for transfers.    Time 12    Period Weeks    Status On-going    Target Date 10/06/20      PT LONG TERM GOAL  #11   TITLE Patient will demonstrate improved static sitting- able to sit unsupported at back for 5 min without evidence of listing to left for improved sitting and postural control to prevent excessive pressure through left hip/arm with sitting.    Baseline 07/14/2020- Patient presents with progressive listing to left side over past month increasing risk for skin breakdown, pain in low back, and long term postural abnormalites.    Time 12    Period Weeks    Status New    Target Date 10/06/20                   Plan - 07/21/20 1429     Clinical Impression Statement Patient demonstrated much improved overall sitting posture vs. Last couple of visits today?- les overall physical assist today and patient able to follow verbal cues well and limited only by fatigue today. He will continue to benefit from skilled PT services to improve his functional strength and postural control for improved transfers/standing endurance    Personal Factors and Comorbidities Age;Comorbidity 3+    Comorbidities Relevant past medical history and comorbidities include long term steroid use for lupus, CKD, chronic osteomyelitis, cervical spine stenosis, BPH, APS, alcohol abuse, peripheral artery disease, depression, GERD, obstructive sleep apnea, HTN, IBS, IgA deficiency, lumbar radiculopathy, RA, Stroke,  toxic maculopathy in both eyes, systemic lupus erythematosus related syndrome, cardiac catheterization, carotid endarterectomy, cervical laminectomy, coronary angioplasty, Fusion C5-C7, knee arthroplasty, lumbar surgery.    Examination-Activity Limitations Bed Mobility;Bend;Sit;Toileting;Stand;Stairs;Lift;Transfers;Squat;Locomotion Level;Carry;Dressing;Hygiene/Grooming;Continence    Examination-Participation Restrictions Yard Work;Interpersonal Relationship;Community Activity    Stability/Clinical Decision Making Evolving/Moderate complexity    Rehab Potential Fair    PT Frequency 2x / week  PT Duration 12 weeks    PT Treatment/Interventions ADLs/Self Care Home Management;Electrical Stimulation;Moist Heat;Cryotherapy;Gait training;Stair training;Functional mobility training;Therapeutic exercise;Balance training;Neuromuscular re-education;Cognitive remediation;Patient/family education;Orthotic Fit/Training;Wheelchair mobility training;Manual techniques;Passive range of motion;Energy conservation;Joint Manipulations;Canalith Repostioning;Therapeutic activities;Vestibular    PT Next Visit Plan Continue with progressive postural/LE strengthening/pregait activities    PT Home Exercise Plan no changes    Consulted and Agree with Plan of Care Patient;Family member/caregiver             Patient will benefit from skilled therapeutic intervention in order to improve the following deficits and impairments:  Abnormal gait, Decreased activity tolerance, Decreased cognition, Decreased endurance, Decreased knowledge of use of DME, Decreased range of motion, Decreased skin integrity, Decreased strength, Impaired perceived functional ability, Impaired sensation, Impaired UE functional use, Improper body mechanics, Pain, Cardiopulmonary status limiting activity, Decreased balance, Decreased coordination, Decreased mobility, Difficulty walking, Impaired tone, Postural dysfunction, Dizziness  Visit  Diagnosis: Abnormality of gait and mobility  Difficulty in walking, not elsewhere classified  Muscle weakness (generalized)  Other lack of coordination     Problem List Patient Active Problem List   Diagnosis Date Noted   Sepsis ([place]) 10/18/2018   Long term (current) use of anticoagulants 05/01/2018   History of stroke 04/11/2018   Hyperlipidemia 07/04/2017   Chronic diarrhea 06/12/2017   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 ([place]) 04/27/2016   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   Fatigue 02/06/2015   Visual changes 06/19/2013   Benign prostatic hyperplasia 01/07/2013   Depression 01/07/2013   Restless legs syndrome 01/07/2013   Chronic kidney disease, stage 3 unspecified ([place]) 12/20/2011   CAD (coronary artery disease) 11/03/2010   Hypertension 11/03/2010   PAD (peripheral artery disease) ([place]) 11/03/2010   SLE (systemic lupus erythematosus) ([place]) 11/03/2010    Lewis Moccasin, PT 07/22/2020, 2:41 PM  [place] MAIN [facility] SERVICES [address] [place], [place], [address] Phone: [phone]   Fax:  [phone]  Name: Kenneth Summers MRN: [id number] Date of Birth: Oct 04, 1945

## 2020-07-26 ENCOUNTER — Ambulatory Visit: Payer: Medicare Other

## 2020-07-26 ENCOUNTER — Other Ambulatory Visit: Payer: Self-pay

## 2020-07-26 ENCOUNTER — Ambulatory Visit: Payer: Medicare Other | Admitting: Occupational Therapy

## 2020-07-26 DIAGNOSIS — R278 Other lack of coordination: Secondary | ICD-10-CM

## 2020-07-26 DIAGNOSIS — R269 Unspecified abnormalities of gait and mobility: Secondary | ICD-10-CM | POA: Diagnosis not present

## 2020-07-26 DIAGNOSIS — M6281 Muscle weakness (generalized): Secondary | ICD-10-CM

## 2020-07-26 DIAGNOSIS — R262 Difficulty in walking, not elsewhere classified: Secondary | ICD-10-CM

## 2020-07-26 NOTE — Therapy (Signed)
[place] MAIN [facility] SERVICES [address] [place], [place], [address] Phone: [phone]   Fax:  [phone]  Occupational Therapy Treatment  Patient Details  Name: Kenneth Summers MRN: [id number] Date of Birth: [birth year] No data recorded  Encounter Date: 07/26/2020   OT End of Session - 07/26/20 1408     Visit Number 76    Number of Visits 254    Date for OT Re-Evaluation 08/16/20    Authorization Type Progress report period starting 05/31/2020    OT Start Time 1350    OT Stop Time 1430    OT Time Calculation (min) 40 min    Activity Tolerance Patient tolerated treatment well    Behavior During Therapy WFL for tasks assessed/performed             Past Medical History:  Diagnosis Date   Actinic keratosis    Alcohol abuse    APS (antiphospholipid syndrome) (HCC)    Basal cell carcinoma    BPH (benign prostatic hyperplasia)    CAD (coronary artery disease)    Cellulitis    Cervical spinal stenosis    Chronic osteomyelitis (HCC)    CKD (chronic kidney disease)    CKD (chronic kidney disease)    Clostridium difficile diarrhea    Depression    Diplopia    Fatigue    GERD (gastroesophageal reflux disease)    Hyperlipemia    Hypertension    Hypovitaminosis D    IBS (irritable bowel syndrome)    IgA deficiency (HCC)    Insomnia    Left lumbosacral radiculopathy    Moderate obstructive sleep apnea    Osteomyelitis of foot (HCC)    PAD (peripheral artery disease) (HCC)    Pruritus    RA (rheumatoid arthritis) (HCC)    Radiculopathy    Restless legs syndrome    RLS (restless legs syndrome)    SLE (systemic lupus erythematosus related syndrome) ([place])    Squamous cell carcinoma of skin 01/27/2019   right mid lower ear helix   Stroke ([place])    Toxic maculopathy of both eyes     Past Surgical History:  Procedure Laterality Date   CARDIAC CATHETERIZATION     CAROTID ENDARTERECTOMY     CERVICAL LAMINECTOMY      CORONARY ANGIOPLASTY     FRACTURE SURGERY     fusion C5-6-7     HERNIA REPAIR     KNEE ARTHROSCOPY     TONSILLECTOMY      There were no vitals filed for this visit.   Subjective Assessment - 07/26/20 1406     Subjective  Pt. reports doing well today    Patient is accompanied by: Family member    Pertinent History Pt. is a 75 y.o. 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 Pt. PMHx includes: Multiple Falls, Lupus, DJD, Renal Abscess, CVA. Pt. resides with his wife. Pt.'s wife and daughter assist with ADLs. Pt. has caregivers in  for 2 hours a day, 6 days a week. Pt. received Rehab services in acute care, at SNF for STR, and [place] services. Pt. is retired from [employer] for [employer] and [employer].    Currently in Pain? No/denies            OT TREATMENT     Therapeutic Exercise:   Pt. worked on [employer], and reciprocal motion using the UBE while seated for 8 min. with no resistance.  Pt. performed 2.5# dowel for UE strengthening secondary to weakness. Pt. tolerated increased dowel weight. Bilateral shoulder flexion, chest press, and circular patterns, were performed for 1-2 sets 10-20 reps each with cues to keep the dowel symmetrical, and level. Pt. worked on 3# dumbbell ex. for bilateral elbow flexion and extension, and right supination/pronation, wrist ext., radial dev. 2# for the left wrist extension, and radial dev.   Pt. continues to make steady progress with BUE strength, and tolerance for there. ex. Pt. required frequent verbal cues, and cues for visual demonstration for movement patterns, however required cues for visual demonstration. Pt. continues to require consistent verbal, visual, and tactile cues. Pt. continues to require cues for radial deviation. Pt. was able to maintain upright sitting in midline with a towel roll. Pt. required a towel roll today for lateral support. Pt. is able to self-correct to midline with verbal  cues. Pt. continues to require less cues to perform UE strengthening exercises, technique, and form as well as visual demonstration prior to the exercises. Pt. continues to work on improving BUE strength, and [facility] skills in order to improve LUE functioning during ADLs, IADL tasks.                        OT Education - 07/26/20 1408     Education Details strengthening, UE functioning    Person(s) Educated Patient;Caregiver(s)    Methods Explanation;Demonstration;Handout    Comprehension Returned demonstration;Tactile cues required;Verbalized understanding;Need further instruction                 OT Long Term Goals - 07/12/20 1408       OT LONG TERM GOAL #1   Title Pt. will increase left shoulder flexion AROM by 10 degrees to access cabinet/shelf.    Baseline 05/31/2020: Left shoulder flexion 119 degrees. Pt. is able to reach into cabinets with more ease, however has difficulty getting close enough to them with his w/c.Pt. continues to present with limited left shoulder ROM limitng access to cabinets, and shelves.03/01/2020: Left shoulder 116 degrees with new onset of numbness radiating from the left side of the neck to the hand. Pt. continues to progress with left shoulder ROM .Shouler flexion 130, and continues to work on reaching with increasing emphasis on flexing trunk to improve functional reach. 200th visit: shoulder flexion to 130 with effort    Time 12    Period Weeks    Status On-going    Target Date 08/16/20      OT LONG TERM GOAL #2   Title Pt. will donn a shirt with Supervision.    Baseline 05/31/2020: [place]. Pt. is assisting more with UE dressing at home. Pt. continues to require modA donning a zip down jacket with consistent cues to initiate the correct direction of the jacket.  Min-modA mahaging the zipper. MinA for donning a T-shirt with assist to roll side to side to pull the shirt down over his back. 200th visit: continues to require min assist at times  for shirt/jacket    Time 12    Period Weeks    Status On-going    Target Date 08/16/20      OT LONG TERM GOAL #3   Title Pt. will require ModA to perform LE dressing    Period Weeks      OT LONG TERM GOAL #4   Title Pt. will improve left grip strength by 10# to be able to open a jar/container.    Baseline  Grip strength continues to be limited. Decreased left sided grip strength today secondary to a new onset of numbenss, and weakness radiating from the neck to the left hand. 200th visit: Pt. continues to have a harder time opening a wide mouthed jar, however is able to open a smaller jar easier.    Time 12    Period Weeks    Status On-going    Target Date 08/16/20      OT LONG TERM GOAL #5   Title Pt. will improve left hand [facility] skills to be able to assist with buttoning/zipping.    Baseline 05/24/2020: Pt. is improving with fastening small buttons. Pt. continues to work on improving [facility] skills  to improve buttoning , and zipping.03/01/2020: Pt.has had a new onset of weakness, and numbness in the left hand beginning upon waking up this morning which has resulted in a significant change in [facility] skills.  Pt. has improved overall donning a jacket, and  manipulating the zipper on his jacket, however continues to require practice depending on the type of zipper jacket. Pt. continues to work on improving bilateral [facility] skills for buttoning/zipping.  200th visit: Pt. has improved with buttons and zippers, however has difficulty manipulating buttons up higher on the shirt.    Time 12    Period Weeks    Status Achieved      OT LONG TERM GOAL #6   Title Pt. will demonstrate visual conmpensatory strategies for 100% of the time during ADLs    Baseline Improving, however Pt. continues to require intermittent cues cues for left sides awareness.    Time 12    Period Weeks    Status On-going    Target Date 08/16/20      OT LONG TERM GOAL #7   Title Pt. will prepare a simple cold snack from w/c with  supervision using cognitive compensatory strategies 100% of the time.    Baseline 200th visit: Pt. has difficulty with getting the w/c close enough to access the refrigerator, and counters    Time 12    Period Weeks    Status Deferred      OT LONG TERM GOAL #8   Title Pt. will accurately identify potential safety hazard using good safety awareness, and judgement 100% for ADLs, and IADLs.    Baseline 200th visit: Pt. continues to require cues    Time 12    Period Weeks    Status On-going    Target Date 08/16/20      OT LONG TERM GOAL  #9   TITLE Pt. will  navigate his w/c around obstacles with Supervision and 100% accuracy    Baseline Pt. has progressed, however, continues to require cuing for obstacles on the left with fewer  cues overall. Pt. requires significant cues to engage his left UE during tasks.. 200th visit: continues to bump into walls at home with the w/ct at times    Time 12    Period Weeks    Status On-going    Target Date 08/16/20      OT LONG TERM GOAL  #10   TITLE Pt. will independently be able  to reach to place items into cabinetry, and closets.    Baseline 200th visit: Pt. continues to have difficulty with functional reaching continues to be limited.    Time 12    Period Weeks    Status On-going    Target Date 08/16/20      OT LONG TERM GOAL  #11  TITLE Pt. will increase BUE strength by 107mm grades in preparation for ADL transfers.    Baseline 05/31/2020: Pt. is progressing, however BUE strength continues to be limited. Pt. is able to tolerate increased resistive weights.    Time 12    Period Weeks    Status On-going    Target Date 08/16/20      OT LONG TERM GOAL  #12   TITLE Pt. will improve LUE functional reaching with the left engaging the trunk 100% of the time during ADL tasks with minimal cues.    Baseline 200th visit: Pt. is limited with LUE functional reaching.    Time 12    Period Weeks    Status On-going    Target Date 08/16/20                    Plan - 07/26/20 1411     Clinical Impression Statement Pt. continues to make steady progress with BUE strength, and tolerance for there. ex. Pt. required frequent verbal cues, and cues for visual demonstration for movement patterns, however required cues for visual demonstration. Pt. continues to require consistent verbal, visual, and tactile cues. Pt. continues to require cues for radial deviation. Pt. was able to maintain upright sitting in midline with a towel roll. Pt. required a towel roll today for lateral support. Pt. is able to self-correct to midline with verbal cues. Pt. continues to require less cues to perform UE strengthening exercises, technique, and form as well as visual demonstration prior to the exercises. Pt. continues to work on improving BUE strength, and [address] skills in order to improve LUE functioning during ADLs, IADL tasks.        OT Occupational Profile and History Problem Focused Assessment - Including review of records relating to presenting problem    Occupational performance deficits (Please refer to evaluation for details): ADL's    Body Structure / Function / Physical Skills UE functional use;Coordination;FMC;Dexterity;Strength;ROM    Cognitive Skills Attention;Memory;Emotional;Problem Solve;Safety Awareness    Rehab Potential Good    Clinical Decision Making Several treatment options, min-mod task modification necessary    Comorbidities Affecting Occupational Performance: Presence of comorbidities impacting occupational performance    Comorbidities impacting occupational performance description: Phyical, cognitive, visual,  medical comorbidities    Modification or Assistance to Complete Evaluation  No modification of tasks or assist necessary to complete eval    OT Frequency 2x / week    OT Duration 12 weeks    OT Treatment/Interventions Self-care/ADL training;DME and/or AE instruction;Therapeutic exercise;Therapeutic activities;Moist Heat;Cognitive  remediation/compensation;Neuromuscular education;Visual/perceptual remediation/compensation;Coping strategies training;Patient/family education;Passive range of motion;Psychosocial skills training;Energy conservation;Functional Mobility Training    Consulted and Agree with Plan of Care Patient;Family member/caregiver             Patient will benefit from skilled therapeutic intervention in order to improve the following deficits and impairments:   Body Structure / Function / Physical Skills: UE functional use, Coordination, FMC, Dexterity, Strength, ROM Cognitive Skills: Attention, Memory, Emotional, Problem Solve, Safety Awareness     Visit Diagnosis: Muscle weakness (generalized)  Other lack of coordination    Problem List Patient Active Problem List   Diagnosis Date Noted   Sepsis ([place]) 10/18/2018   Long term (current) use of anticoagulants 05/01/2018   History of stroke 04/11/2018   Hyperlipidemia 07/04/2017   Chronic diarrhea 06/12/2017   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 ([place]) 04/27/2016   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   Fatigue 02/06/2015  Visual changes 06/19/2013   Benign prostatic hyperplasia 01/07/2013   Depression 01/07/2013   Restless legs syndrome 01/07/2013   Chronic kidney disease, stage 3 unspecified ([place]) 12/20/2011   CAD (coronary artery disease) 11/03/2010   Hypertension 11/03/2010   PAD (peripheral artery disease) ([place]) 11/03/2010   SLE (systemic lupus erythematosus) ([place]) 11/03/2010    Harrel Carina, MS, OTR/L 07/26/2020, 2:16 PM  [place] MAIN [facility] SERVICES [place], [place], [address] Phone: [phone]   Fax:  [phone]  Name: WILLIS HOLQUIN MRN: [id number] Date of Birth: [birth year]

## 2020-07-26 NOTE — Therapy (Signed)
[place] MAIN [facility] SERVICES [address] [place], [place], [address] Phone: [phone]   Fax:  [phone]  Physical Therapy Treatment  Patient Details  Name: Kenneth Summers MRN: [id number] Date of Birth: May 13, 1945 No data recorded  Encounter Date: 07/26/2020   PT End of Session - 07/26/20 1316     Visit Number [id number]    Number of Visits 301    Date for PT Re-Evaluation 10/06/20    Authorization Type Medicare reporting period starting 09/11/18; NO FOTO    Authorization Time Period Recert 2/33/0076-03/11/6331; Recert 06/10/5623-6/38/9373    PT Start Time 1300    PT Stop Time 1340    PT Time Calculation (min) 40 min    Equipment Utilized During Treatment Gait belt    Activity Tolerance No increased pain;Patient limited by fatigue;Patient tolerated treatment well    Behavior During Therapy [facility] for tasks assessed/performed             Past Medical History:  Diagnosis Date   Actinic keratosis    Alcohol abuse    APS (antiphospholipid syndrome) (HCC)    Basal cell carcinoma    BPH (benign prostatic hyperplasia)    CAD (coronary artery disease)    Cellulitis    Cervical spinal stenosis    Chronic osteomyelitis (HCC)    CKD (chronic kidney disease)    CKD (chronic kidney disease)    Clostridium difficile diarrhea    Depression    Diplopia    Fatigue    GERD (gastroesophageal reflux disease)    Hyperlipemia    Hypertension    Hypovitaminosis D    IBS (irritable bowel syndrome)    IgA deficiency (HCC)    Insomnia    Left lumbosacral radiculopathy    Moderate obstructive sleep apnea    Osteomyelitis of foot (HCC)    PAD (peripheral artery disease) (HCC)    Pruritus    RA (rheumatoid arthritis) (HCC)    Radiculopathy    Restless legs syndrome    RLS (restless legs syndrome)    SLE (systemic lupus erythematosus related syndrome) ([place])    Squamous cell carcinoma of skin 01/27/2019   right mid lower ear helix   Stroke ([place])     Toxic maculopathy of both eyes     Past Surgical History:  Procedure Laterality Date   CARDIAC CATHETERIZATION     CAROTID ENDARTERECTOMY     CERVICAL LAMINECTOMY     CORONARY ANGIOPLASTY     FRACTURE SURGERY     fusion C5-6-7     HERNIA REPAIR     KNEE ARTHROSCOPY     TONSILLECTOMY      There were no vitals filed for this visit.   Subjective Assessment - 07/26/20 1307     Subjective Patient reports doing okay today- denies any pain    Patient is accompained by: Family member    Pertinent History Patient is a 75 y.o. male who presents to outpatient physical therapy with a referral for medical diagnosis of CVA. This patient's chief complaints consist of left hemiplegia and overall deconditioning leading to the following functional deficits: dependent for ADLs, IADLs, unable to transfer to car, dependent transfers at home with hoyer lift, unable to walk or stand without significant assistance, difficulty with W/C navigation..    Limitations Sitting;Lifting;Standing;House hold activities;Writing;Walking;Other (comment)    Diagnostic tests Brain MRI 11/01/2017: IMPRESSION: 1. Acute right ACA territory infarct as demonstrated on prior CT imaging. No intracranial hemorrhage or significant mass  effect. 2. 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    Patient Stated Goals wants to be able to walk again    Pain Onset Today    Pain Onset Today                Interventions:   Sit to stand in // bars with BUE support and Max A with increased time required to lean forward.   Static standing in // bars 1) 36 sec 2) 30 sec 3) 28 sec  4) 26 sec  5) 20 Sec  *Overall- patient with max difficulty maintaining standing position with poor ability to extend hips and look ahead. Patient fatigues quickly requiring more physical assist to stand and maintain standing. Poor left quad control today with standing.  Seated knee ext x 10 reps (AROM on right and  AAROM on left)    sitting posture therex:   Physical assist to lean forward and hold x 20 sec x 3 (difficulty maintain erect sitting posture- requiring increasing assist).   UE shoulder flex while sitting erect - Using PVC pipe- assisting patient to lift on left side. 2 sets of 10 reps.   Clinical Impression: Patient continues to struggle with sit to stand and static standing ability- requiring more physical assist and poor ability to extend knees or hips to stand erect. He also exhibited excessive fatigue limiting his progress than carried over to sitting postural activities. He appeared overall more distracted and required more cues to stay on task. He will continue to benefit from skilled PT services to improve his functional strength and postural control for improved transfers/standing endurance                      PT Education - 07/26/20 1315     Education Details Postural cues with standing and sitting    Person(s) Educated Patient    Methods Explanation;Demonstration;Tactile cues;Verbal cues    Comprehension Verbalized understanding;Returned demonstration;Verbal cues required;Tactile cues required;Need further instruction              PT Short Term Goals - 07/14/20 9528       PT SHORT TERM GOAL #1   Title Be independent with initial home exercise program for self-management of symptoms.    Baseline HEP to be given at second session; advice to practice unsupported sitting at home (06/19/2018); 07/08/18: Performing at home, 6/29: needs assistance but is doing exercise program regularly; 08/22/18: adherent;    Time 2    Period Weeks    Status Achieved    Target Date 06/26/18      PT SHORT TERM GOAL #2   Title Patient will perform sit to stand transfer with mod Assist in parallel bars for improved transfer ability with decreased caregiver assist.    Baseline 04/19/2020- Max assist of 1 person. 07/14/2020- Patient continues to require Max assist for sit to stand  transfers    Time 6    Period Weeks    Status On-going    Target Date 08/25/20               PT Long Term Goals - 07/21/20 0723       PT LONG TERM GOAL #1   Title Patient will complete all bed mobility with min A to improve functional independence for getting in and out of bed and adjusting in bed.     Baseline max A - total A reported by family (06/19/2018); 07/08/18: still  requires maxA, dtr reports improvement in rolling since starting therapy; 6/23: min A to supervision in clinic; not doing at home right; 8/5: Pt's daughter states that his rolling has improved, but still has difficulty with all other bed mobility; 10/23/18: pts daughter states that she is doing about 35% of the work if he is in proper position, he does uses the bed rails to help roll;  using Harrel Lemon for coming to sit EOB; 10/12: pts daughter states that she is doing about 30% of the work if he is in proper position, he does uses the bed rails to help roll, but it is improving;  using Harrel Lemon for coming to sit EOB, 10/29: min A for sit to sidelying, rolling supervision, able to transition sidelying to sit with HHA; 12/21: min-mod A. 03/01/2020- Patient presents improving bed mobility with current ability to roll to left with CGA and verbal and tactile cues and min assist to roll to right as seen by recent visits. 03/31/2020- Patient and caregiver report that he has maintained ability to roll side to side with only minimal assistance.    Time 12    Period Weeks    Status Achieved      PT LONG TERM GOAL #2   Title Patient will complete sit <> stand transfer chair to chair with Min A and LRAD to improve functional independence for household and community mobility.     Baseline min A for STS, still requires elevated surface height to 27".  03/01/20- Patient currently unable to perform a chair to chair transfer and continues to be transferred using hoyer lift. Patient has been training with sit to stand at parallel bars and able to  currently perform sit to stand with max assist  from 27in. high surface.03/31/2020- Patient remains at [facility] assist with sit to stand transfer and continued use of hoyer lift transfer for all chair to chair or bed to chair transfers. 04/19/2020- Patient requires Max assist with sit to stand transfers and requires hoyer for chair to chair transfers. 07/14/2020- Patient has continued to require max assist since Feb for sit to stand transfers- Do not anticipate that this is a realistic goal at this time.    Time 12    Period Weeks    Status Not Met    Target Date --   Goal no longer appropriate.     PT LONG TERM GOAL #3   Title Patient will navigate power w/c with min A x 100 feet to improve mobility for household and short community distances.    Baseline required total A (06/12/2018); able to roll 20 feet with min A (06/20/2018); 07/08/18: Pt can go approximately 10' without assist per family;  10/23/2018: pt's daughter states that his power WC mobility has become 100% better with the new hand control, slowed speed, and with practice; 10/12: pt's daughter reports that he can perform household WC mobility and limited community ambulation with minA    Time 12    Period Weeks    Status Achieved      PT LONG TERM GOAL #4   Title Patient will complete car transfers W/C to family SUV with min A using LRAD to improve his abilty to participate in community activities.     Baseline unable to complete car transfers at all (06/12/2018); unable to complete car transfers at all (06/19/2018).; 07/08/18: Unable to perform at this time; 09/11/18: unable to perform at this time; 10/23/2018: unable to complete car transfers at all; 10/12:  unable  to complete car transfers at all, 10/29: unable; 01/13/19: unable; 2/11: min-mod A +2, 3/10: min A+1, 12/21: mod A. 03/01/2020- Patient unable to complete car transfers and continues to require medical transportation at this time. 3/14- Patient reports he may be purchasing a conversion van later  this week.07/14/2020- Patient did not purchase the conversion Lucianne Lei and continues to require medical transport for all outings using his wheelchair.    Time 12    Period Weeks    Status Deferred    Target Date 10/06/20      PT LONG TERM GOAL #5   Title Patient will ambulate at least 10 feet with min A using LRAD to improve mobility for household and community distances.    Baseline unable to take steps (06/12/2018); able to shift R leg forward and back with max A and RW at edge of plinth (06/19/2018); 07/08/18: unable to ambulate at this time. 09/11/18: unable to perform at this time; 10/23/2018: unable to perform at this time; 10/12: unable to perform at this time, 10/29: unable at this time; 01/13/19: unable at this time, 2/11: min A to step with RLE, unable to step with LLE, 3/10: able to take 2 steps in parallel bars with min A for safety; 4/1: min A for walking 10 feet in parallel bars, 5/24: min A walking 10 feet in parallel bars; 6/21: no change 7/14: no change. 03/01/2020- Patient unable to ambulate and currently focusing on static standing. He was able to take a forward step with his right LE last week but unable today. 03/31/2020- Patient continues to be non-ambulatory but has been able to move right LE forward with standing but unable to take a step with left LE. 04/19/2020- Goal is still not appropriate as of yet- cont to focus on static and dynamic standing balance/pregait activities. 07/14/2020- Goal still not appropriate- working on ability to dynamically weight shift for transfers and continuing with static transfers.    Time 12    Period Weeks    Status Deferred    Target Date 10/06/20      Additional Long Term Goals   Additional Long Term Goals Yes      PT LONG TERM GOAL #6   Title Patient will be able to safety navigate up/down ramp with power chair, exhibiting good safety awareness, mod I to safely enter/exit his home.    Baseline 2/11: supervision with intermittent min A;4/1: supervision;  5/24: supervision. 03/01/2020- patient reports that he is able to navigate down the ramp at home to leave the home but continues to need assist to navigate up the ramp into the home. 07/14/2020- Patient reports able to negotiate w/c in/out of home.    Time 12    Period Weeks    Status Achieved      PT LONG TERM GOAL #7   Title Patient will increase functional reach test to >15 inches in sitting to exhibit improved sitting balance and positioning and reduce fall risk;    Baseline 07/30/18: 12 inches; 09/11/18: 10-12 inches; 10/23/18: 12-13 inch; 10/12: 14.5", 10/29: 15.5 inch    Time 4    Period Weeks    Status Achieved      PT LONG TERM GOAL #8   Title Patient will increase LLE quad strength to 3+/5 to improve functional strength for standing and mobility;    Baseline 10/28: 2+/5; 01/13/19: 2+/5, 2/11: 3-/5, 04/16/19: 3-/5, 05/08/19: 3-/5, 5/24: 3-/5, 6/21: 3-/5, 7/14: 3-/5, 12/21: 3-/5. 03/01/2020- Left quad= 3-/5 (focusing on quad  strengthening during visits and with home program). 03/31/2020- left quad = 3-/5 with MMT. 04/19/2020= 3- /5 with left quad strength with manual muscle testing. 07/14/2020- Patient presents with 3-/5 and quickly fatigues with knee ext yet functionall able to stand of left LE without knee buckling- yet unable to terminally extend left knee with static standing.    Time 12    Period Weeks    Status On-going    Target Date 10/06/20      PT LONG TERM GOAL  #9   TITLE Patient will report no vertigo with provoking motions or positions in order to be able to perform ADLs and mobility tasks without symptoms.    Baseline 06/30/19: reports no dizziness in last week; 6/21: one episode this weekend; 7/14: experiencing one a week; 03/01/2020.- Patient has denied any dizziness for over past 3 weeks.    Time 12    Period Weeks    Status Achieved      PT LONG TERM GOAL  #10   TITLE Patient will demonstrate improved functional LE/Postural strength as seen by ability to perform static  standing for 2 min or greater with max assist  to assist with strength required for ADL's and transfers/pregait/gait activities.    Baseline 03/01/2020- Patient able to stand approx 40 sec today with max assist in parallel bars. 03/31/2020- Patient able to demo static stand with max A for 32min 47 sec today. 04/19/2020- Patient able to demo 2 min today with mod/max assist - yet remains inconsistent. 07/14/2020- Did not attempt today but during past visits during this cert- patient has been inconsistent standing for 30 sec to 2 min. Will keep goal active to strive for more consistency to help with strength for transfers.    Time 12    Period Weeks    Status On-going    Target Date 10/06/20      PT LONG TERM GOAL  #11   TITLE Patient will demonstrate improved static sitting- able to sit unsupported at back for 5 min without evidence of listing to left for improved sitting and postural control to prevent excessive pressure through left hip/arm with sitting.    Baseline 07/14/2020- Patient presents with progressive listing to left side over past month increasing risk for skin breakdown, pain in low back, and long term postural abnormalites.    Time 12    Period Weeks    Status New    Target Date 10/06/20                   Plan - 07/26/20 1317     Clinical Impression Statement Patient continues to struggle with sit to stand and static standing ability- requiring more physical assist and poor ability to extend knees or hips to stand erect. He also exhibited excessive fatigue limiting his progress than carried over to sitting postural activities. He appeared overall more distracted and required more cues to stay on task. He will continue to benefit from skilled PT services to improve his functional strength and postural control for improved transfers/standing endurance    Personal Factors and Comorbidities Age;Comorbidity 3+    Comorbidities Relevant past medical history and comorbidities include  long term steroid use for lupus, CKD, chronic osteomyelitis, cervical spine stenosis, BPH, APS, alcohol abuse, peripheral artery disease, depression, GERD, obstructive sleep apnea, HTN, IBS, IgA deficiency, lumbar radiculopathy, RA, Stroke, toxic maculopathy in both eyes, systemic lupus erythematosus related syndrome, cardiac catheterization, carotid endarterectomy, cervical laminectomy, coronary angioplasty, Fusion C5-C7, knee arthroplasty,  lumbar surgery.    Examination-Activity Limitations Bed Mobility;Bend;Sit;Toileting;Stand;Stairs;Lift;Transfers;Squat;Locomotion Level;Carry;Dressing;Hygiene/Grooming;Continence    Examination-Participation Restrictions Yard Work;Interpersonal Relationship;Community Activity    Stability/Clinical Decision Making Evolving/Moderate complexity    Rehab Potential Fair    PT Frequency 2x / week    PT Duration 12 weeks    PT Treatment/Interventions ADLs/Self Care Home Management;Electrical Stimulation;Moist Heat;Cryotherapy;Gait training;Stair training;Functional mobility training;Therapeutic exercise;Balance training;Neuromuscular re-education;Cognitive remediation;Patient/family education;Orthotic Fit/Training;Wheelchair mobility training;Manual techniques;Passive range of motion;Energy conservation;Joint Manipulations;Canalith Repostioning;Therapeutic activities;Vestibular    PT Next Visit Plan Continue with progressive postural/LE strengthening/pregait activities    PT Home Exercise Plan no changes    Consulted and Agree with Plan of Care Patient;Family member/caregiver             Patient will benefit from skilled therapeutic intervention in order to improve the following deficits and impairments:  Abnormal gait, Decreased activity tolerance, Decreased cognition, Decreased endurance, Decreased knowledge of use of DME, Decreased range of motion, Decreased skin integrity, Decreased strength, Impaired perceived functional ability, Impaired sensation, Impaired UE  functional use, Improper body mechanics, Pain, Cardiopulmonary status limiting activity, Decreased balance, Decreased coordination, Decreased mobility, Difficulty walking, Impaired tone, Postural dysfunction, Dizziness  Visit Diagnosis: Abnormality of gait and mobility  Difficulty in walking, not elsewhere classified  Muscle weakness (generalized)  Other lack of coordination     Problem List Patient Active Problem List   Diagnosis Date Noted   Sepsis ([place]) 10/18/2018   Long term (current) use of anticoagulants 05/01/2018   History of stroke 04/11/2018   Hyperlipidemia 07/04/2017   Chronic diarrhea 06/12/2017   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 ([place]) 04/27/2016   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   Fatigue 02/06/2015   Visual changes 06/19/2013   Benign prostatic hyperplasia 01/07/2013   Depression 01/07/2013   Restless legs syndrome 01/07/2013   Chronic kidney disease, stage 3 unspecified ([place]) 12/20/2011   CAD (coronary artery disease) 11/03/2010   Hypertension 11/03/2010   PAD (peripheral artery disease) ([place]) 11/03/2010   SLE (systemic lupus erythematosus) ([place]) 11/03/2010    Lewis Moccasin, PT 07/27/2020, 4:17 PM  [place] MAIN [facility] SERVICES [address] [place], [place], [address] Phone: [phone]   Fax:  [phone]  Name: KENNEY GOING MRN: [id number] Date of Birth: [birth year]

## 2020-07-28 ENCOUNTER — Other Ambulatory Visit: Payer: Self-pay

## 2020-07-28 ENCOUNTER — Ambulatory Visit: Payer: Medicare Other

## 2020-07-28 ENCOUNTER — Encounter: Payer: Self-pay | Admitting: Occupational Therapy

## 2020-07-28 ENCOUNTER — Ambulatory Visit: Payer: Medicare Other | Admitting: Occupational Therapy

## 2020-07-28 DIAGNOSIS — M6281 Muscle weakness (generalized): Secondary | ICD-10-CM

## 2020-07-28 DIAGNOSIS — R269 Unspecified abnormalities of gait and mobility: Secondary | ICD-10-CM

## 2020-07-28 DIAGNOSIS — R262 Difficulty in walking, not elsewhere classified: Secondary | ICD-10-CM

## 2020-07-28 DIAGNOSIS — R278 Other lack of coordination: Secondary | ICD-10-CM

## 2020-07-28 NOTE — Therapy (Signed)
[place] MAIN [facility] SERVICES [address] [place], [place], [address] Phone: [phone]   Fax:  [phone]  Physical Therapy Treatment  Patient Details  Name: Kenneth Summers MRN: [id number] Date of Birth: March 09, 1945 No data recorded  Encounter Date: 07/28/2020   PT End of Session - 07/28/20 1506     Visit Number 253    Number of Visits 301    Date for PT Re-Evaluation 10/06/20    Authorization Type Medicare reporting period starting 09/11/18; NO FOTO    Authorization Time Period Recert 09/24/2991-08/06/6965; Recert 09/14/3808-1/75/1025    PT Start Time 1300    PT Stop Time 1334    PT Time Calculation (min) 34 min    Equipment Utilized During Treatment Gait belt    Activity Tolerance No increased pain;Patient limited by fatigue;Patient tolerated treatment well    Behavior During Therapy [facility] for tasks assessed/performed             Past Medical History:  Diagnosis Date   Actinic keratosis    Alcohol abuse    APS (antiphospholipid syndrome) (HCC)    Basal cell carcinoma    BPH (benign prostatic hyperplasia)    CAD (coronary artery disease)    Cellulitis    Cervical spinal stenosis    Chronic osteomyelitis (HCC)    CKD (chronic kidney disease)    CKD (chronic kidney disease)    Clostridium difficile diarrhea    Depression    Diplopia    Fatigue    GERD (gastroesophageal reflux disease)    Hyperlipemia    Hypertension    Hypovitaminosis D    IBS (irritable bowel syndrome)    IgA deficiency (HCC)    Insomnia    Left lumbosacral radiculopathy    Moderate obstructive sleep apnea    Osteomyelitis of foot (HCC)    PAD (peripheral artery disease) (HCC)    Pruritus    RA (rheumatoid arthritis) (HCC)    Radiculopathy    Restless legs syndrome    RLS (restless legs syndrome)    SLE (systemic lupus erythematosus related syndrome) ([place])    Squamous cell carcinoma of skin 01/27/2019   right mid lower ear helix   Stroke ([place])     Toxic maculopathy of both eyes     Past Surgical History:  Procedure Laterality Date   CARDIAC CATHETERIZATION     CAROTID ENDARTERECTOMY     CERVICAL LAMINECTOMY     CORONARY ANGIOPLASTY     FRACTURE SURGERY     fusion C5-6-7     HERNIA REPAIR     KNEE ARTHROSCOPY     TONSILLECTOMY      There were no vitals filed for this visit.   Subjective Assessment - 07/28/20 1505     Subjective Patient reports being tired today- agreeable to visit stating he will do what he can.    Patient is accompained by: Family member    Pertinent History Patient is a 75 y.o. male who presents to outpatient physical therapy with a referral for medical diagnosis of CVA. This patient's chief complaints consist of left hemiplegia and overall deconditioning leading to the following functional deficits: dependent for ADLs, IADLs, unable to transfer to car, dependent transfers at home with hoyer lift, unable to walk or stand without significant assistance, difficulty with W/C navigation..    Limitations Sitting;Lifting;Standing;House hold activities;Writing;Walking;Other (comment)    Diagnostic tests Brain MRI 11/01/2017: IMPRESSION: 1. Acute right ACA territory infarct as demonstrated on prior CT  imaging. No intracranial hemorrhage or significant mass effect. 2. 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    Patient Stated Goals wants to be able to walk again    Currently in Pain? No/denies    Pain Onset Today    Pain Onset Today                   Interventions:   Stand pivot transfer- Max assist to scoot forward and sit upright. He required Max A +1 with another person CGA to transfer from chair to mat and later back to chair. He was unable to extend knees with standing today.   Sitting on edge of mat:  Sitting postural exercises= 5 min of static sitting while beginning visit-CGA provided by caregiver and max VC to sit upright without listing to left.   Patient  attempted to slide his leg (on gliding disc) Left yet unable today and then able to perform partial ROM with LAQ with left LE elevated x 10 reps today.   Patient then performed soccer ball kick back and forth with PT with caregiver providing CGA for erect sitting and able to kick ball only with right LE x 5 min today.   Attempted standing x 2 trials yet patient unable to stand erect despite max A of 2 people today. He was uanble to extend either knee or hips today and presented at increased risk of falling.   Clinical Impression: Patient presents with increased overall weakness in Bilateral LE with max difficulty standing and transferring today. He was alert but more quiet this session. Denied any pain or new issues - able to concentrate and stay on task but requested to stop earlier stating "I am just tired today." He will continue to benefit from skilled PT services to improve his functional strength and postural control for improved transfers/standing                     PT Education - 07/28/20 1506     Education Details Postural cues in sitting/standing.    Person(s) Educated Patient    Methods Explanation;Demonstration;Tactile cues;Verbal cues    Comprehension Verbalized understanding;Returned demonstration;Verbal cues required;Tactile cues required;Need further instruction              PT Short Term Goals - 07/14/20 8119       PT SHORT TERM GOAL #1   Title Be independent with initial home exercise program for self-management of symptoms.    Baseline HEP to be given at second session; advice to practice unsupported sitting at home (06/19/2018); 07/08/18: Performing at home, 6/29: needs assistance but is doing exercise program regularly; 08/22/18: adherent;    Time 2    Period Weeks    Status Achieved    Target Date 06/26/18      PT SHORT TERM GOAL #2   Title Patient will perform sit to stand transfer with mod Assist in parallel bars for improved transfer ability  with decreased caregiver assist.    Baseline 04/19/2020- Max assist of 1 person. 07/14/2020- Patient continues to require Max assist for sit to stand transfers    Time 6    Period Weeks    Status On-going    Target Date 08/25/20               PT Long Term Goals - 07/21/20 0723       PT LONG TERM GOAL #1   Title Patient will complete all bed  mobility with min A to improve functional independence for getting in and out of bed and adjusting in bed.     Baseline max A - total A reported by family (06/19/2018); 07/08/18: still requires maxA, dtr reports improvement in rolling since starting therapy; 6/23: min A to supervision in clinic; not doing at home right; 8/5: Pt's daughter states that his rolling has improved, but still has difficulty with all other bed mobility; 10/23/18: pts daughter states that she is doing about 35% of the work if he is in proper position, he does uses the bed rails to help roll;  using Harrel Lemon for coming to sit EOB; 10/12: pts daughter states that she is doing about 30% of the work if he is in proper position, he does uses the bed rails to help roll, but it is improving;  using Harrel Lemon for coming to sit EOB, 10/29: min A for sit to sidelying, rolling supervision, able to transition sidelying to sit with HHA; 12/21: min-mod A. 03/01/2020- Patient presents improving bed mobility with current ability to roll to left with CGA and verbal and tactile cues and min assist to roll to right as seen by recent visits. 03/31/2020- Patient and caregiver report that he has maintained ability to roll side to side with only minimal assistance.    Time 12    Period Weeks    Status Achieved      PT LONG TERM GOAL #2   Title Patient will complete sit <> stand transfer chair to chair with Min A and LRAD to improve functional independence for household and community mobility.     Baseline min A for STS, still requires elevated surface height to 27".  03/01/20- Patient currently unable to perform a  chair to chair transfer and continues to be transferred using hoyer lift. Patient has been training with sit to stand at parallel bars and able to currently perform sit to stand with max assist  from 27in. high surface.03/31/2020- Patient remains at [facility] assist with sit to stand transfer and continued use of hoyer lift transfer for all chair to chair or bed to chair transfers. 04/19/2020- Patient requires Max assist with sit to stand transfers and requires hoyer for chair to chair transfers. 07/14/2020- Patient has continued to require max assist since Feb for sit to stand transfers- Do not anticipate that this is a realistic goal at this time.    Time 12    Period Weeks    Status Not Met    Target Date --   Goal no longer appropriate.     PT LONG TERM GOAL #3   Title Patient will navigate power w/c with min A x 100 feet to improve mobility for household and short community distances.    Baseline required total A (06/12/2018); able to roll 20 feet with min A (06/20/2018); 07/08/18: Pt can go approximately 10' without assist per family;  10/23/2018: pt's daughter states that his power WC mobility has become 100% better with the new hand control, slowed speed, and with practice; 10/12: pt's daughter reports that he can perform household WC mobility and limited community ambulation with minA    Time 12    Period Weeks    Status Achieved      PT LONG TERM GOAL #4   Title Patient will complete car transfers W/C to family SUV with min A using LRAD to improve his abilty to participate in community activities.     Baseline unable to complete car transfers at  all (06/12/2018); unable to complete car transfers at all (06/19/2018).; 07/08/18: Unable to perform at this time; 09/11/18: unable to perform at this time; 10/23/2018: unable to complete car transfers at all; 10/12:  unable to complete car transfers at all, 10/29: unable; 01/13/19: unable; 2/11: min-mod A +2, 3/10: min A+1, 12/21: mod A. 03/01/2020- Patient unable to  complete car transfers and continues to require medical transportation at this time. 3/14- Patient reports he may be purchasing a conversion van later this week.07/14/2020- Patient did not purchase the conversion Lucianne Lei and continues to require medical transport for all outings using his wheelchair.    Time 12    Period Weeks    Status Deferred    Target Date 10/06/20      PT LONG TERM GOAL #5   Title Patient will ambulate at least 10 feet with min A using LRAD to improve mobility for household and community distances.    Baseline unable to take steps (06/12/2018); able to shift R leg forward and back with max A and RW at edge of plinth (06/19/2018); 07/08/18: unable to ambulate at this time. 09/11/18: unable to perform at this time; 10/23/2018: unable to perform at this time; 10/12: unable to perform at this time, 10/29: unable at this time; 01/13/19: unable at this time, 2/11: min A to step with RLE, unable to step with LLE, 3/10: able to take 2 steps in parallel bars with min A for safety; 4/1: min A for walking 10 feet in parallel bars, 5/24: min A walking 10 feet in parallel bars; 6/21: no change 7/14: no change. 03/01/2020- Patient unable to ambulate and currently focusing on static standing. He was able to take a forward step with his right LE last week but unable today. 03/31/2020- Patient continues to be non-ambulatory but has been able to move right LE forward with standing but unable to take a step with left LE. 04/19/2020- Goal is still not appropriate as of yet- cont to focus on static and dynamic standing balance/pregait activities. 07/14/2020- Goal still not appropriate- working on ability to dynamically weight shift for transfers and continuing with static transfers.    Time 12    Period Weeks    Status Deferred    Target Date 10/06/20      Additional Long Term Goals   Additional Long Term Goals Yes      PT LONG TERM GOAL #6   Title Patient will be able to safety navigate up/down ramp with power  chair, exhibiting good safety awareness, mod I to safely enter/exit his home.    Baseline 2/11: supervision with intermittent min A;4/1: supervision; 5/24: supervision. 03/01/2020- patient reports that he is able to navigate down the ramp at home to leave the home but continues to need assist to navigate up the ramp into the home. 07/14/2020- Patient reports able to negotiate w/c in/out of home.    Time 12    Period Weeks    Status Achieved      PT LONG TERM GOAL #7   Title Patient will increase functional reach test to >15 inches in sitting to exhibit improved sitting balance and positioning and reduce fall risk;    Baseline 07/30/18: 12 inches; 09/11/18: 10-12 inches; 10/23/18: 12-13 inch; 10/12: 14.5", 10/29: 15.5 inch    Time 4    Period Weeks    Status Achieved      PT LONG TERM GOAL #8   Title Patient will increase LLE quad strength to 3+/5 to improve  functional strength for standing and mobility;    Baseline 10/28: 2+/5; 01/13/19: 2+/5, 2/11: 3-/5, 04/16/19: 3-/5, 05/08/19: 3-/5, 5/24: 3-/5, 6/21: 3-/5, 7/14: 3-/5, 12/21: 3-/5. 03/01/2020- Left quad= 3-/5 (focusing on quad strengthening during visits and with home program). 03/31/2020- left quad = 3-/5 with MMT. 04/19/2020= 3- /5 with left quad strength with manual muscle testing. 07/14/2020- Patient presents with 3-/5 and quickly fatigues with knee ext yet functionall able to stand of left LE without knee buckling- yet unable to terminally extend left knee with static standing.    Time 12    Period Weeks    Status On-going    Target Date 10/06/20      PT LONG TERM GOAL  #9   TITLE Patient will report no vertigo with provoking motions or positions in order to be able to perform ADLs and mobility tasks without symptoms.    Baseline 06/30/19: reports no dizziness in last week; 6/21: one episode this weekend; 7/14: experiencing one a week; 03/01/2020.- Patient has denied any dizziness for over past 3 weeks.    Time 12    Period Weeks    Status  Achieved      PT LONG TERM GOAL  #10   TITLE Patient will demonstrate improved functional LE/Postural strength as seen by ability to perform static standing for 2 min or greater with max assist  to assist with strength required for ADL's and transfers/pregait/gait activities.    Baseline 03/01/2020- Patient able to stand approx 40 sec today with max assist in parallel bars. 03/31/2020- Patient able to demo static stand with max A for 32min 47 sec today. 04/19/2020- Patient able to demo 2 min today with mod/max assist - yet remains inconsistent. 07/14/2020- Did not attempt today but during past visits during this cert- patient has been inconsistent standing for 30 sec to 2 min. Will keep goal active to strive for more consistency to help with strength for transfers.    Time 12    Period Weeks    Status On-going    Target Date 10/06/20      PT LONG TERM GOAL  #11   TITLE Patient will demonstrate improved static sitting- able to sit unsupported at back for 5 min without evidence of listing to left for improved sitting and postural control to prevent excessive pressure through left hip/arm with sitting.    Baseline 07/14/2020- Patient presents with progressive listing to left side over past month increasing risk for skin breakdown, pain in low back, and long term postural abnormalites.    Time 12    Period Weeks    Status New    Target Date 10/06/20                   Plan - 07/28/20 1507     Clinical Impression Statement Patient presents with increased overall weakness in Bilateral LE with max difficulty standing and transferring today. He was alert but more quiet this session. Denied any pain or new issues - able to concentrate and stay on task but requested to stop earlier stating "I am just tired today." He will continue to benefit from skilled PT services to improve his functional strength and postural control for improved transfers/standing   ?    Personal Factors and Comorbidities  Age;Comorbidity 3+    Comorbidities Relevant past medical history and comorbidities include long term steroid use for lupus, CKD, chronic osteomyelitis, cervical spine stenosis, BPH, APS, alcohol abuse, peripheral artery disease, depression, GERD, obstructive sleep  apnea, HTN, IBS, IgA deficiency, lumbar radiculopathy, RA, Stroke, toxic maculopathy in both eyes, systemic lupus erythematosus related syndrome, cardiac catheterization, carotid endarterectomy, cervical laminectomy, coronary angioplasty, Fusion C5-C7, knee arthroplasty, lumbar surgery.    Examination-Activity Limitations Bed Mobility;Bend;Sit;Toileting;Stand;Stairs;Lift;Transfers;Squat;Locomotion Level;Carry;Dressing;Hygiene/Grooming;Continence    Examination-Participation Restrictions Yard Work;Interpersonal Relationship;Community Activity    Stability/Clinical Decision Making Evolving/Moderate complexity    Rehab Potential Fair    PT Frequency 2x / week    PT Duration 12 weeks    PT Treatment/Interventions ADLs/Self Care Home Management;Electrical Stimulation;Moist Heat;Cryotherapy;Gait training;Stair training;Functional mobility training;Therapeutic exercise;Balance training;Neuromuscular re-education;Cognitive remediation;Patient/family education;Orthotic Fit/Training;Wheelchair mobility training;Manual techniques;Passive range of motion;Energy conservation;Joint Manipulations;Canalith Repostioning;Therapeutic activities;Vestibular    PT Next Visit Plan Continue with progressive postural/LE strengthening/pregait activities    PT Home Exercise Plan no changes    Consulted and Agree with Plan of Care Patient;Family member/caregiver             Patient will benefit from skilled therapeutic intervention in order to improve the following deficits and impairments:  Abnormal gait, Decreased activity tolerance, Decreased cognition, Decreased endurance, Decreased knowledge of use of DME, Decreased range of motion, Decreased skin integrity,  Decreased strength, Impaired perceived functional ability, Impaired sensation, Impaired UE functional use, Improper body mechanics, Pain, Cardiopulmonary status limiting activity, Decreased balance, Decreased coordination, Decreased mobility, Difficulty walking, Impaired tone, Postural dysfunction, Dizziness  Visit Diagnosis: Abnormality of gait and mobility  Difficulty in walking, not elsewhere classified  Muscle weakness (generalized)     Problem List Patient Active Problem List   Diagnosis Date Noted   Sepsis ([place]) 10/18/2018   Long term (current) use of anticoagulants 05/01/2018   History of stroke 04/11/2018   Hyperlipidemia 07/04/2017   Chronic diarrhea 06/12/2017   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 ([place]) 04/27/2016   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   Fatigue 02/06/2015   Visual changes 06/19/2013   Benign prostatic hyperplasia 01/07/2013   Depression 01/07/2013   Restless legs syndrome 01/07/2013   Chronic kidney disease, stage 3 unspecified ([place]) 12/20/2011   CAD (coronary artery disease) 11/03/2010   Hypertension 11/03/2010   PAD (peripheral artery disease) ([place]) 11/03/2010   SLE (systemic lupus erythematosus) ([place]) 11/03/2010    Lewis Moccasin, PT 07/29/2020, 8:01 AM  [place] MAIN [facility] SERVICES [address] [place], [place], [address] Phone: [phone]   Fax:  [phone]  Name: ZAYDEN MAFFEI MRN: [id number] Date of Birth: [birth year]

## 2020-07-28 NOTE — Therapy (Signed)
[place] MAIN [facility] SERVICES [address] [place], [place], [address] Phone: [phone]   Fax:  [phone]  Occupational Therapy Treatment  Patient Details  Name: Kenneth Summers MRN: [id number] Date of Birth: 1945-08-21 No data recorded  Encounter Date: 07/28/2020   OT End of Session - 07/28/20 1547     Visit Number 204    Number of Visits 61    Date for OT Re-Evaluation 08/16/20    Authorization Type Progress report period starting 05/31/2020    OT Start Time 1345    OT Stop Time 1430    OT Time Calculation (min) 45 min    Activity Tolerance Patient tolerated treatment well    Behavior During Therapy WFL for tasks assessed/performed             Past Medical History:  Diagnosis Date   Actinic keratosis    Alcohol abuse    APS (antiphospholipid syndrome) (HCC)    Basal cell carcinoma    BPH (benign prostatic hyperplasia)    CAD (coronary artery disease)    Cellulitis    Cervical spinal stenosis    Chronic osteomyelitis (HCC)    CKD (chronic kidney disease)    CKD (chronic kidney disease)    Clostridium difficile diarrhea    Depression    Diplopia    Fatigue    GERD (gastroesophageal reflux disease)    Hyperlipemia    Hypertension    Hypovitaminosis D    IBS (irritable bowel syndrome)    IgA deficiency (HCC)    Insomnia    Left lumbosacral radiculopathy    Moderate obstructive sleep apnea    Osteomyelitis of foot (HCC)    PAD (peripheral artery disease) (HCC)    Pruritus    RA (rheumatoid arthritis) (HCC)    Radiculopathy    Restless legs syndrome    RLS (restless legs syndrome)    SLE (systemic lupus erythematosus related syndrome) ([place])    Squamous cell carcinoma of skin 01/27/2019   right mid lower ear helix   Stroke ([place])    Toxic maculopathy of both eyes     Past Surgical History:  Procedure Laterality Date   CARDIAC CATHETERIZATION     CAROTID ENDARTERECTOMY     CERVICAL LAMINECTOMY      CORONARY ANGIOPLASTY     FRACTURE SURGERY     fusion C5-6-7     HERNIA REPAIR     KNEE ARTHROSCOPY     TONSILLECTOMY      There were no vitals filed for this visit.   Subjective Assessment - 07/28/20 1546     Subjective  Pt. reports that he is doing well.    Patient is accompanied by: Family member    Pertinent History Pt. is a 75 y.o. 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 Pt. PMHx includes: Multiple Falls, Lupus, DJD, Renal Abscess, CVA. Pt. resides with his wife. Pt.'s wife and daughter assist with ADLs. Pt. has caregivers in  for 2 hours a day, 6 days a week. Pt. received Rehab services in acute care, at SNF for STR, and [place] services. Pt. is retired from [employer] for [employer] and [employer].    Currently in Pain? No/denies    Pain Orientation Right    Pain Descriptors / Indicators Aching    Pain Type Chronic pain            OT TREATMENT     Therapeutic  Exercise:   Pt. worked on [employer], and reciprocal motion using the UBE while seated for 8 min. with no resistance.  Pt. performed 2.5# dowel for UE strengthening secondary to weakness. Pt. tolerated increased dowel weight. Bilateral shoulder flexion, chest press, and circular patterns, were performed for 1-2 sets 10 reps each with cues to keep the dowel symmetrical, and level. Pt. worked on 3# dumbbell ex. for bilateral elbow flexion and extension, and right supination/pronation, wrist ext., radial dev. 2# for the left wrist extension, and radial dev.   Pt. continues to make steady progress with BUE strength, and tolerance for there. ex.  Pt.required several rest breaks throughout the session. Pt. required frequent verbal cues, and cues for visual demonstration for movement patterns, however required cues for visual demonstration. Pt. continues to require consistent verbal, visual, and tactile cues. Pt. continues to require cues for radial deviation. Pt. was able to maintain  upright sitting in midline with a towel roll. Pt. required a towel roll today for lateral support. Pt. Continues to be able to self-correct to midline with verbal cues, however does require assist at times. Pt. continues to require less cues to perform UE strengthening exercises, technique, and form as well as visual demonstration prior to the exercises. Pt. continues to work on improving BUE strength, and [facility] skills in order to improve LUE functioning during ADLs, IADL tasks.                                 OT Education - 07/28/20 1547     Education Details strengthening, UE functioning    Person(s) Educated Patient;Caregiver(s)    Methods Explanation;Demonstration;Handout    Comprehension Returned demonstration;Tactile cues required;Verbalized understanding;Need further instruction                 OT Long Term Goals - 07/12/20 1408       OT LONG TERM GOAL #1   Title Pt. will increase left shoulder flexion AROM by 10 degrees to access cabinet/shelf.    Baseline 05/31/2020: Left shoulder flexion 119 degrees. Pt. is able to reach into cabinets with more ease, however has difficulty getting close enough to them with his w/c.Pt. continues to present with limited left shoulder ROM limitng access to cabinets, and shelves.03/01/2020: Left shoulder 116 degrees with new onset of numbness radiating from the left side of the neck to the hand. Pt. continues to progress with left shoulder ROM .Shouler flexion 130, and continues to work on reaching with increasing emphasis on flexing trunk to improve functional reach. 200th visit: shoulder flexion to 130 with effort    Time 12    Period Weeks    Status On-going    Target Date 08/16/20      OT LONG TERM GOAL #2   Title Pt. will donn a shirt with Supervision.    Baseline 05/31/2020: [place]. Pt. is assisting more with UE dressing at home. Pt. continues to require modA donning a zip down jacket with consistent cues to initiate the  correct direction of the jacket.  Min-modA mahaging the zipper. MinA for donning a T-shirt with assist to roll side to side to pull the shirt down over his back. 200th visit: continues to require min assist at times for shirt/jacket    Time 12    Period Weeks    Status On-going    Target Date 08/16/20      OT LONG TERM GOAL #3  Title Pt. will require ModA to perform LE dressing    Period Weeks      OT LONG TERM GOAL #4   Title Pt. will improve left grip strength by 10# to be able to open a jar/container.    Baseline Grip strength continues to be limited. Decreased left sided grip strength today secondary to a new onset of numbenss, and weakness radiating from the neck to the left hand. 200th visit: Pt. continues to have a harder time opening a wide mouthed jar, however is able to open a smaller jar easier.    Time 12    Period Weeks    Status On-going    Target Date 08/16/20      OT LONG TERM GOAL #5   Title Pt. will improve left hand [facility] skills to be able to assist with buttoning/zipping.    Baseline 05/24/2020: Pt. is improving with fastening small buttons. Pt. continues to work on improving [facility] skills  to improve buttoning , and zipping.03/01/2020: Pt.has had a new onset of weakness, and numbness in the left hand beginning upon waking up this morning which has resulted in a significant change in [facility] skills.  Pt. has improved overall donning a jacket, and  manipulating the zipper on his jacket, however continues to require practice depending on the type of zipper jacket. Pt. continues to work on improving bilateral [facility] skills for buttoning/zipping.  200th visit: Pt. has improved with buttons and zippers, however has difficulty manipulating buttons up higher on the shirt.    Time 12    Period Weeks    Status Achieved      OT LONG TERM GOAL #6   Title Pt. will demonstrate visual conmpensatory strategies for 100% of the time during ADLs    Baseline Improving, however Pt. continues to require  intermittent cues cues for left sides awareness.    Time 12    Period Weeks    Status On-going    Target Date 08/16/20      OT LONG TERM GOAL #7   Title Pt. will prepare a simple cold snack from w/c with supervision using cognitive compensatory strategies 100% of the time.    Baseline 200th visit: Pt. has difficulty with getting the w/c close enough to access the refrigerator, and counters    Time 12    Period Weeks    Status Deferred      OT LONG TERM GOAL #8   Title Pt. will accurately identify potential safety hazard using good safety awareness, and judgement 100% for ADLs, and IADLs.    Baseline 200th visit: Pt. continues to require cues    Time 12    Period Weeks    Status On-going    Target Date 08/16/20      OT LONG TERM GOAL  #9   TITLE Pt. will  navigate his w/c around obstacles with Supervision and 100% accuracy    Baseline Pt. has progressed, however, continues to require cuing for obstacles on the left with fewer  cues overall. Pt. requires significant cues to engage his left UE during tasks.. 200th visit: continues to bump into walls at home with the w/ct at times    Time 12    Period Weeks    Status On-going    Target Date 08/16/20      OT LONG TERM GOAL  #10   TITLE Pt. will independently be able  to reach to place items into cabinetry, and closets.    Baseline  200th visit: Pt. continues to have difficulty with functional reaching continues to be limited.    Time 12    Period Weeks    Status On-going    Target Date 08/16/20      OT LONG TERM GOAL  #11   TITLE Pt. will increase BUE strength by 28mm grades in preparation for ADL transfers.    Baseline 05/31/2020: Pt. is progressing, however BUE strength continues to be limited. Pt. is able to tolerate increased resistive weights.    Time 12    Period Weeks    Status On-going    Target Date 08/16/20      OT LONG TERM GOAL  #12   TITLE Pt. will improve LUE functional reaching with the left engaging the trunk  100% of the time during ADL tasks with minimal cues.    Baseline 200th visit: Pt. is limited with LUE functional reaching.    Time 12    Period Weeks    Status On-going    Target Date 08/16/20                   Plan - 07/28/20 1547     Clinical Impression Statement Pt. continues to make steady progress with BUE strength, and tolerance for there. ex.  Pt.required several rest breaks throughout the session. Pt. required frequent verbal cues, and cues for visual demonstration for movement patterns, however required cues for visual demonstration. Pt. continues to require consistent verbal, visual, and tactile cues. Pt. continues to require cues for radial deviation. Pt. was able to maintain upright sitting in midline with a towel roll. Pt. required a towel roll today for lateral support. Pt. Continues to be able to self-correct to midline with verbal cues, however does require assist at times. Pt. continues to require less cues to perform UE strengthening exercises, technique, and form as well as visual demonstration prior to the exercises. Pt. continues to work on improving BUE strength, and [facility] skills in order to improve LUE functioning during ADLs, IADL tasks.              OT Occupational Profile and History Problem Focused Assessment - Including review of records relating to presenting problem    Occupational performance deficits (Please refer to evaluation for details): ADL's    Body Structure / Function / Physical Skills UE functional use;Coordination;FMC;Dexterity;Strength;ROM    Cognitive Skills Attention;Memory;Emotional;Problem Solve;Safety Awareness    Rehab Potential Good    Clinical Decision Making Several treatment options, min-mod task modification necessary    Comorbidities Affecting Occupational Performance: Presence of comorbidities impacting occupational performance    Comorbidities impacting occupational performance description: Phyical, cognitive, visual,  medical  comorbidities    Modification or Assistance to Complete Evaluation  No modification of tasks or assist necessary to complete eval    OT Frequency 2x / week    OT Duration 12 weeks    OT Treatment/Interventions Self-care/ADL training;DME and/or AE instruction;Therapeutic exercise;Therapeutic activities;Moist Heat;Cognitive remediation/compensation;Neuromuscular education;Visual/perceptual remediation/compensation;Coping strategies training;Patient/family education;Passive range of motion;Psychosocial skills training;Energy conservation;Functional Mobility Training    Consulted and Agree with Plan of Care Patient;Family member/caregiver    Family Member Consulted Caregiver Ch[place]             Patient will benefit from skilled therapeutic intervention in order to improve the following deficits and impairments:   Body Structure / Function / Physical Skills: UE functional use, Coordination, FMC, Dexterity, Strength, ROM Cognitive Skills: Attention, Memory, Emotional, Problem Solve, Safety Awareness  Visit Diagnosis: Muscle weakness (generalized)  Other lack of coordination    Problem List Patient Active Problem List   Diagnosis Date Noted   Sepsis ([place]) 10/18/2018   Long term (current) use of anticoagulants 05/01/2018   History of stroke 04/11/2018   Hyperlipidemia 07/04/2017   Chronic diarrhea 06/12/2017   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 [place], [place], [address] Phone: [phone]   Fax:  [phone]  Name: CHESKEL SILVERIO MRN: [id number] Date of Birth: August 20, 1945

## 2020-08-02 ENCOUNTER — Ambulatory Visit: Payer: Medicare Other | Admitting: Occupational Therapy

## 2020-08-02 ENCOUNTER — Encounter: Payer: Self-pay | Admitting: Occupational Therapy

## 2020-08-02 ENCOUNTER — Other Ambulatory Visit: Payer: Self-pay

## 2020-08-02 ENCOUNTER — Ambulatory Visit: Payer: Medicare Other

## 2020-08-02 DIAGNOSIS — M6281 Muscle weakness (generalized): Secondary | ICD-10-CM

## 2020-08-02 DIAGNOSIS — R278 Other lack of coordination: Secondary | ICD-10-CM

## 2020-08-02 DIAGNOSIS — R269 Unspecified abnormalities of gait and mobility: Secondary | ICD-10-CM | POA: Diagnosis not present

## 2020-08-02 DIAGNOSIS — R262 Difficulty in walking, not elsewhere classified: Secondary | ICD-10-CM

## 2020-08-02 NOTE — Therapy (Signed)
[place] MAIN [facility] SERVICES [address] [place], [place], [address] Phone: [phone]   Fax:  [phone]  Physical Therapy Treatment  Patient Details  Name: Kenneth Summers MRN: [id number] Date of Birth: 11/11/1945 No data recorded  Encounter Date: 08/02/2020   PT End of Session - 08/02/20 1356     Visit Number 254    Number of Visits 301    Date for PT Re-Evaluation 10/06/20    Authorization Type Medicare reporting period starting 09/11/18; NO FOTO    Authorization Time Period Recert 6/59/9357-0/02/7791; Recert 9/0/3009-2/33/0076    PT Start Time 1300    PT Stop Time 1343    PT Time Calculation (min) 43 min    Equipment Utilized During Treatment Gait belt    Activity Tolerance No increased pain;Patient limited by fatigue;Patient tolerated treatment well    Behavior During Therapy [facility] for tasks assessed/performed             Past Medical History:  Diagnosis Date   Actinic keratosis    Alcohol abuse    APS (antiphospholipid syndrome) (HCC)    Basal cell carcinoma    BPH (benign prostatic hyperplasia)    CAD (coronary artery disease)    Cellulitis    Cervical spinal stenosis    Chronic osteomyelitis (HCC)    CKD (chronic kidney disease)    CKD (chronic kidney disease)    Clostridium difficile diarrhea    Depression    Diplopia    Fatigue    GERD (gastroesophageal reflux disease)    Hyperlipemia    Hypertension    Hypovitaminosis D    IBS (irritable bowel syndrome)    IgA deficiency (HCC)    Insomnia    Left lumbosacral radiculopathy    Moderate obstructive sleep apnea    Osteomyelitis of foot (HCC)    PAD (peripheral artery disease) (HCC)    Pruritus    RA (rheumatoid arthritis) (HCC)    Radiculopathy    Restless legs syndrome    RLS (restless legs syndrome)    SLE (systemic lupus erythematosus related syndrome) ([place])    Squamous cell carcinoma of skin 01/27/2019   right mid lower ear helix   Stroke ([place])     Toxic maculopathy of both eyes     Past Surgical History:  Procedure Laterality Date   CARDIAC CATHETERIZATION     CAROTID ENDARTERECTOMY     CERVICAL LAMINECTOMY     CORONARY ANGIOPLASTY     FRACTURE SURGERY     fusion C5-6-7     HERNIA REPAIR     KNEE ARTHROSCOPY     TONSILLECTOMY      There were no vitals filed for this visit.   Subjective Assessment - 08/02/20 1351     Subjective Patient reports feeling okay today with no new complaints. States he was able to go to [place] last Thurs. Reports some difficulty negotiating his power wheelchair but daughter able to navigate for a safe outing.    Patient is accompained by: Family member    Pertinent History Patient is a 75 y.o. male who presents to outpatient physical therapy with a referral for medical diagnosis of CVA. This patient's chief complaints consist of left hemiplegia and overall deconditioning leading to the following functional deficits: dependent for ADLs, IADLs, unable to transfer to car, dependent transfers at home with hoyer lift, unable to walk or stand without significant assistance, difficulty with W/C navigation..    Limitations Sitting;Lifting;Standing;House hold  activities;Writing;Walking;Other (comment)    Diagnostic tests Brain MRI 11/01/2017: IMPRESSION: 1. Acute right ACA territory infarct as demonstrated on prior CT imaging. No intracranial hemorrhage or significant mass effect. 2. 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    Patient Stated Goals wants to be able to walk again    Currently in Pain? No/denies    Pain Onset Today    Pain Onset Today             Interventions:   Posture activity- Sitting in his power chair- leaning forward pushing a pvc pipe forward on top of //bar railing x 15 reps x 2 sets  Active trunk Sidebend Bilaterally x 15 reps with opp arm overhead. Tactile cues for correct technique.  Resistive Hip flex using YTB on right LE  x 15 reps and active assistive on left LE x 15 reps.   Added NMES to left quad to facilitate a contraction. - Ramped up to 57 on both channels (4 electrodes) on 10 sec/ off 10 sec x 8 min today- Minimal ability to see contraction but patient could feel appropriately and performed contraction (LAQ) during on times.   Education provided throughout session via VC/TC and demonstration to facilitate movement at target joints and correct muscle activation for all testing and exercises performed.   Clinical Impression: Patient with difficulty with left quad contraction so attempted NMES with some improvement- able to perform through partial ROM today. He will benefit from further attempt to see if able to elicit a better contraction.  He will continue to benefit from skilled PT services to improve his functional strength and postural control for improved transfers/standing  --                            PT Education - 08/02/20 1355     Education Details Exercise technique    Person(s) Educated Patient    Methods Explanation;Demonstration;Tactile cues;Verbal cues    Comprehension Verbalized understanding;Returned demonstration;Verbal cues required;Need further instruction;Tactile cues required              PT Short Term Goals - 07/14/20 0721       PT SHORT TERM GOAL #1   Title Be independent with initial home exercise program for self-management of symptoms.    Baseline HEP to be given at second session; advice to practice unsupported sitting at home (06/19/2018); 07/08/18: Performing at home, 6/29: needs assistance but is doing exercise program regularly; 08/22/18: adherent;    Time 2    Period Weeks    Status Achieved    Target Date 06/26/18      PT SHORT TERM GOAL #2   Title Patient will perform sit to stand transfer with mod Assist in parallel bars for improved transfer ability with decreased caregiver assist.    Baseline 04/19/2020- Max assist of 1 person. 07/14/2020-  Patient continues to require Max assist for sit to stand transfers    Time 6    Period Weeks    Status On-going    Target Date 08/25/20               PT Long Term Goals - 07/21/20 0723       PT LONG TERM GOAL #1   Title Patient will complete all bed mobility with min A to improve functional independence for getting in and out of bed and adjusting in bed.     Baseline max A -  total A reported by family (06/19/2018); 07/08/18: still requires maxA, dtr reports improvement in rolling since starting therapy; 6/23: min A to supervision in clinic; not doing at home right; 8/5: Pt's daughter states that his rolling has improved, but still has difficulty with all other bed mobility; 10/23/18: pts daughter states that she is doing about 35% of the work if he is in proper position, he does uses the bed rails to help roll;  using Harrel Lemon for coming to sit EOB; 10/12: pts daughter states that she is doing about 30% of the work if he is in proper position, he does uses the bed rails to help roll, but it is improving;  using Harrel Lemon for coming to sit EOB, 10/29: min A for sit to sidelying, rolling supervision, able to transition sidelying to sit with HHA; 12/21: min-mod A. 03/01/2020- Patient presents improving bed mobility with current ability to roll to left with CGA and verbal and tactile cues and min assist to roll to right as seen by recent visits. 03/31/2020- Patient and caregiver report that he has maintained ability to roll side to side with only minimal assistance.    Time 12    Period Weeks    Status Achieved      PT LONG TERM GOAL #2   Title Patient will complete sit <> stand transfer chair to chair with Min A and LRAD to improve functional independence for household and community mobility.     Baseline min A for STS, still requires elevated surface height to 27".  03/01/20- Patient currently unable to perform a chair to chair transfer and continues to be transferred using hoyer lift. Patient has been  training with sit to stand at parallel bars and able to currently perform sit to stand with max assist  from 27in. high surface.03/31/2020- Patient remains at [facility] assist with sit to stand transfer and continued use of hoyer lift transfer for all chair to chair or bed to chair transfers. 04/19/2020- Patient requires Max assist with sit to stand transfers and requires hoyer for chair to chair transfers. 07/14/2020- Patient has continued to require max assist since Feb for sit to stand transfers- Do not anticipate that this is a realistic goal at this time.    Time 12    Period Weeks    Status Not Met    Target Date --   Goal no longer appropriate.     PT LONG TERM GOAL #3   Title Patient will navigate power w/c with min A x 100 feet to improve mobility for household and short community distances.    Baseline required total A (06/12/2018); able to roll 20 feet with min A (06/20/2018); 07/08/18: Pt can go approximately 10' without assist per family;  10/23/2018: pt's daughter states that his power WC mobility has become 100% better with the new hand control, slowed speed, and with practice; 10/12: pt's daughter reports that he can perform household WC mobility and limited community ambulation with minA    Time 12    Period Weeks    Status Achieved      PT LONG TERM GOAL #4   Title Patient will complete car transfers W/C to family SUV with min A using LRAD to improve his abilty to participate in community activities.     Baseline unable to complete car transfers at all (06/12/2018); unable to complete car transfers at all (06/19/2018).; 07/08/18: Unable to perform at this time; 09/11/18: unable to perform at this time; 10/23/2018: unable to  complete car transfers at all; 10/12:  unable to complete car transfers at all, 10/29: unable; 01/13/19: unable; 2/11: min-mod A +2, 3/10: min A+1, 12/21: mod A. 03/01/2020- Patient unable to complete car transfers and continues to require medical transportation at this time. 3/14-  Patient reports he may be purchasing a conversion van later this week.07/14/2020- Patient did not purchase the conversion Lucianne Lei and continues to require medical transport for all outings using his wheelchair.    Time 12    Period Weeks    Status Deferred    Target Date 10/06/20      PT LONG TERM GOAL #5   Title Patient will ambulate at least 10 feet with min A using LRAD to improve mobility for household and community distances.    Baseline unable to take steps (06/12/2018); able to shift R leg forward and back with max A and RW at edge of plinth (06/19/2018); 07/08/18: unable to ambulate at this time. 09/11/18: unable to perform at this time; 10/23/2018: unable to perform at this time; 10/12: unable to perform at this time, 10/29: unable at this time; 01/13/19: unable at this time, 2/11: min A to step with RLE, unable to step with LLE, 3/10: able to take 2 steps in parallel bars with min A for safety; 4/1: min A for walking 10 feet in parallel bars, 5/24: min A walking 10 feet in parallel bars; 6/21: no change 7/14: no change. 03/01/2020- Patient unable to ambulate and currently focusing on static standing. He was able to take a forward step with his right LE last week but unable today. 03/31/2020- Patient continues to be non-ambulatory but has been able to move right LE forward with standing but unable to take a step with left LE. 04/19/2020- Goal is still not appropriate as of yet- cont to focus on static and dynamic standing balance/pregait activities. 07/14/2020- Goal still not appropriate- working on ability to dynamically weight shift for transfers and continuing with static transfers.    Time 12    Period Weeks    Status Deferred    Target Date 10/06/20      Additional Long Term Goals   Additional Long Term Goals Yes      PT LONG TERM GOAL #6   Title Patient will be able to safety navigate up/down ramp with power chair, exhibiting good safety awareness, mod I to safely enter/exit his home.    Baseline  2/11: supervision with intermittent min A;4/1: supervision; 5/24: supervision. 03/01/2020- patient reports that he is able to navigate down the ramp at home to leave the home but continues to need assist to navigate up the ramp into the home. 07/14/2020- Patient reports able to negotiate w/c in/out of home.    Time 12    Period Weeks    Status Achieved      PT LONG TERM GOAL #7   Title Patient will increase functional reach test to >15 inches in sitting to exhibit improved sitting balance and positioning and reduce fall risk;    Baseline 07/30/18: 12 inches; 09/11/18: 10-12 inches; 10/23/18: 12-13 inch; 10/12: 14.5", 10/29: 15.5 inch    Time 4    Period Weeks    Status Achieved      PT LONG TERM GOAL #8   Title Patient will increase LLE quad strength to 3+/5 to improve functional strength for standing and mobility;    Baseline 10/28: 2+/5; 01/13/19: 2+/5, 2/11: 3-/5, 04/16/19: 3-/5, 05/08/19: 3-/5, 5/24: 3-/5, 6/21: 3-/5, 7/14: 3-/5, 12/21:  3-/5. 03/01/2020- Left quad= 3-/5 (focusing on quad strengthening during visits and with home program). 03/31/2020- left quad = 3-/5 with MMT. 04/19/2020= 3- /5 with left quad strength with manual muscle testing. 07/14/2020- Patient presents with 3-/5 and quickly fatigues with knee ext yet functionall able to stand of left LE without knee buckling- yet unable to terminally extend left knee with static standing.    Time 12    Period Weeks    Status On-going    Target Date 10/06/20      PT LONG TERM GOAL  #9   TITLE Patient will report no vertigo with provoking motions or positions in order to be able to perform ADLs and mobility tasks without symptoms.    Baseline 06/30/19: reports no dizziness in last week; 6/21: one episode this weekend; 7/14: experiencing one a week; 03/01/2020.- Patient has denied any dizziness for over past 3 weeks.    Time 12    Period Weeks    Status Achieved      PT LONG TERM GOAL  #10   TITLE Patient will demonstrate improved functional  LE/Postural strength as seen by ability to perform static standing for 2 min or greater with max assist  to assist with strength required for ADL's and transfers/pregait/gait activities.    Baseline 03/01/2020- Patient able to stand approx 40 sec today with max assist in parallel bars. 03/31/2020- Patient able to demo static stand with max A for 25mn 47 sec today. 04/19/2020- Patient able to demo 2 min today with mod/max assist - yet remains inconsistent. 07/14/2020- Did not attempt today but during past visits during this cert- patient has been inconsistent standing for 30 sec to 2 min. Will keep goal active to strive for more consistency to help with strength for transfers.    Time 12    Period Weeks    Status On-going    Target Date 10/06/20      PT LONG TERM GOAL  #11   TITLE Patient will demonstrate improved static sitting- able to sit unsupported at back for 5 min without evidence of listing to left for improved sitting and postural control to prevent excessive pressure through left hip/arm with sitting.    Baseline 07/14/2020- Patient presents with progressive listing to left side over past month increasing risk for skin breakdown, pain in low back, and long term postural abnormalites.    Time 12    Period Weeks    Status New    Target Date 10/06/20                   Plan - 08/02/20 1357     Clinical Impression Statement Patient with difficulty with left quad contraction so attempted NMES with some improvement- able to perform through partial ROM today. He will benefit from further attempt to see if able to elicit a better contraction.   He will continue to benefit from skilled PT services to improve his functional strength and postural control for improved transfers/standing.    Personal Factors and Comorbidities Age;Comorbidity 3+    Comorbidities Relevant past medical history and comorbidities include long term steroid use for lupus, CKD, chronic osteomyelitis, cervical spine  stenosis, BPH, APS, alcohol abuse, peripheral artery disease, depression, GERD, obstructive sleep apnea, HTN, IBS, IgA deficiency, lumbar radiculopathy, RA, Stroke, toxic maculopathy in both eyes, systemic lupus erythematosus related syndrome, cardiac catheterization, carotid endarterectomy, cervical laminectomy, coronary angioplasty, Fusion C5-C7, knee arthroplasty, lumbar surgery.    Examination-Activity Limitations Bed Mobility;Bend;Sit;Toileting;Stand;Stairs;Lift;Transfers;Squat;Locomotion Level;Carry;Dressing;Hygiene/Grooming;Continence  Examination-Participation Restrictions Yard Work;Interpersonal Relationship;Community Activity    Stability/Clinical Decision Making Evolving/Moderate complexity    Rehab Potential Fair    PT Frequency 2x / week    PT Duration 12 weeks    PT Treatment/Interventions ADLs/Self Care Home Management;Electrical Stimulation;Moist Heat;Cryotherapy;Gait training;Stair training;Functional mobility training;Therapeutic exercise;Balance training;Neuromuscular re-education;Cognitive remediation;Patient/family education;Orthotic Fit/Training;Wheelchair mobility training;Manual techniques;Passive range of motion;Energy conservation;Joint Manipulations;Canalith Repostioning;Therapeutic activities;Vestibular    PT Next Visit Plan Continue with progressive postural/LE strengthening/pregait activities    PT Home Exercise Plan no changes    Consulted and Agree with Plan of Care Patient;Family member/caregiver             Patient will benefit from skilled therapeutic intervention in order to improve the following deficits and impairments:  Abnormal gait, Decreased activity tolerance, Decreased cognition, Decreased endurance, Decreased knowledge of use of DME, Decreased range of motion, Decreased skin integrity, Decreased strength, Impaired perceived functional ability, Impaired sensation, Impaired UE functional use, Improper body mechanics, Pain, Cardiopulmonary status limiting  activity, Decreased balance, Decreased coordination, Decreased mobility, Difficulty walking, Impaired tone, Postural dysfunction, Dizziness  Visit Diagnosis: Abnormality of gait and mobility  Difficulty in walking, not elsewhere classified  Muscle weakness (generalized)  Other lack of coordination     Problem List Patient Active Problem List   Diagnosis Date Noted   Sepsis ([place]) 10/18/2018   Long term (current) use of anticoagulants 05/01/2018   History of stroke 04/11/2018   Hyperlipidemia 07/04/2017   Chronic diarrhea 06/12/2017   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 [place], [place], [address] Phone: [phone]   Fax:  [phone]  Name: ARTEZ REGIS MRN: [id number] Date of Birth: 10-08-1945

## 2020-08-02 NOTE — Therapy (Signed)
[place] MAIN [facility] SERVICES [address] [place], [place], [address] Phone: [phone]   Fax:  [phone]  Occupational Therapy Treatment  Patient Details  Name: Kenneth Summers MRN: [id number] Date of Birth: Dec 01, 1945 No data recorded  Encounter Date: 08/02/2020   OT End of Session - 08/02/20 1408     Visit Number [id number]    Number of Visits 254    Date for OT Re-Evaluation 08/16/20    Authorization Type Progress report period starting 05/31/2020    OT Start Time 1345    OT Stop Time 1430    OT Time Calculation (min) 45 min    Activity Tolerance Patient tolerated treatment well    Behavior During Therapy WFL for tasks assessed/performed             Past Medical History:  Diagnosis Date   Actinic keratosis    Alcohol abuse    APS (antiphospholipid syndrome) (HCC)    Basal cell carcinoma    BPH (benign prostatic hyperplasia)    CAD (coronary artery disease)    Cellulitis    Cervical spinal stenosis    Chronic osteomyelitis (HCC)    CKD (chronic kidney disease)    CKD (chronic kidney disease)    Clostridium difficile diarrhea    Depression    Diplopia    Fatigue    GERD (gastroesophageal reflux disease)    Hyperlipemia    Hypertension    Hypovitaminosis D    IBS (irritable bowel syndrome)    IgA deficiency (HCC)    Insomnia    Left lumbosacral radiculopathy    Moderate obstructive sleep apnea    Osteomyelitis of foot (HCC)    PAD (peripheral artery disease) (HCC)    Pruritus    RA (rheumatoid arthritis) (HCC)    Radiculopathy    Restless legs syndrome    RLS (restless legs syndrome)    SLE (systemic lupus erythematosus related syndrome) ([place])    Squamous cell carcinoma of skin 01/27/2019   right mid lower ear helix   Stroke ([place])    Toxic maculopathy of both eyes     Past Surgical History:  Procedure Laterality Date   CARDIAC CATHETERIZATION     CAROTID ENDARTERECTOMY     CERVICAL LAMINECTOMY      CORONARY ANGIOPLASTY     FRACTURE SURGERY     fusion C5-6-7     HERNIA REPAIR     KNEE ARTHROSCOPY     TONSILLECTOMY      There were no vitals filed for this visit.   Subjective Assessment - 08/02/20 1407     Subjective  Pt. reports that he is doing well today.    Patient is accompanied by: Family member    Pertinent History Pt. is a 75 y.o. 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 Pt. PMHx includes: Multiple Falls, Lupus, DJD, Renal Abscess, CVA. Pt. resides with his wife. Pt.'s wife and daughter assist with ADLs. Pt. has caregivers in  for 2 hours a day, 6 days a week. Pt. received Rehab services in acute care, at SNF for STR, and [facility] services. Pt. is retired from [employer] for [employer] and [employer].    Currently in Pain? No/denies             OT TREATMENT     Therapeutic Exercise:   Pt. worked on [employer], and reciprocal motion using the UBE while seated for 8 min.  with no resistance.  Pt. performed 2.5# dowel for UE strengthening secondary to weakness. Pt. tolerated increased dowel weight. Bilateral shoulder flexion, chest press, and circular patterns, were performed for 1-2 sets 10 reps each with cues to keep the dowel symmetrical, and level. Pt. worked on 3# dumbbell ex. for bilateral elbow flexion and extension, and right supination/pronation,  and 2# for the left wrist extension.   Pt. continues to make steady progress with BUE strength, and tolerance for there. ex. Pt. required fewer rest breaks throughout the session. Pt. required frequent verbal cues, and cues for visual demonstration for movement patterns, however required cues for visual demonstration. Pt. continues to require consistent verbal, visual, and tactile cues. Pt. continues to require cues for radial deviation. Pt. was able to maintain upright sitting in midline without a towel roll. Pt. continues to require less cues to perform UE strengthening exercises,  technique, and form as well as visual demonstration prior to the exercises. Pt. continues to work on improving BUE strength, and [facility] skills in order to improve LUE functioning during ADLs, IADL tasks.                          OT Education - 08/02/20 1408     Education Details strengthening, UE functioning    Person(s) Educated Patient;Caregiver(s)    Methods Explanation;Demonstration;Handout    Comprehension Returned demonstration;Tactile cues required;Verbalized understanding;Need further instruction                 OT Long Term Goals - 07/12/20 1408       OT LONG TERM GOAL #1   Title Pt. will increase left shoulder flexion AROM by 10 degrees to access cabinet/shelf.    Baseline 05/31/2020: Left shoulder flexion 119 degrees. Pt. is able to reach into cabinets with more ease, however has difficulty getting close enough to them with his w/c.Pt. continues to present with limited left shoulder ROM limitng access to cabinets, and shelves.03/01/2020: Left shoulder 116 degrees with new onset of numbness radiating from the left side of the neck to the hand. Pt. continues to progress with left shoulder ROM .Shouler flexion 130, and continues to work on reaching with increasing emphasis on flexing trunk to improve functional reach. 200th visit: shoulder flexion to 130 with effort    Time 12    Period Weeks    Status On-going    Target Date 08/16/20      OT LONG TERM GOAL #2   Title Pt. will donn a shirt with Supervision.    Baseline 05/31/2020: [place]. Pt. is assisting more with UE dressing at home. Pt. continues to require modA donning a zip down jacket with consistent cues to initiate the correct direction of the jacket.  Min-modA mahaging the zipper. MinA for donning a T-shirt with assist to roll side to side to pull the shirt down over his back. 200th visit: continues to require min assist at times for shirt/jacket    Time 12    Period Weeks    Status On-going    Target  Date 08/16/20      OT LONG TERM GOAL #3   Title Pt. will require ModA to perform LE dressing    Period Weeks      OT LONG TERM GOAL #4   Title Pt. will improve left grip strength by 10# to be able to open a jar/container.    Baseline Grip strength continues to be limited. Decreased left sided grip  strength today secondary to a new onset of numbenss, and weakness radiating from the neck to the left hand. 200th visit: Pt. continues to have a harder time opening a wide mouthed jar, however is able to open a smaller jar easier.    Time 12    Period Weeks    Status On-going    Target Date 08/16/20      OT LONG TERM GOAL #5   Title Pt. will improve left hand [facility] skills to be able to assist with buttoning/zipping.    Baseline 05/24/2020: Pt. is improving with fastening small buttons. Pt. continues to work on improving [facility] skills  to improve buttoning , and zipping.03/01/2020: Pt.has had a new onset of weakness, and numbness in the left hand beginning upon waking up this morning which has resulted in a significant change in [facility] skills.  Pt. has improved overall donning a jacket, and  manipulating the zipper on his jacket, however continues to require practice depending on the type of zipper jacket. Pt. continues to work on improving bilateral [facility] skills for buttoning/zipping.  200th visit: Pt. has improved with buttons and zippers, however has difficulty manipulating buttons up higher on the shirt.    Time 12    Period Weeks    Status Achieved      OT LONG TERM GOAL #6   Title Pt. will demonstrate visual conmpensatory strategies for 100% of the time during ADLs    Baseline Improving, however Pt. continues to require intermittent cues cues for left sides awareness.    Time 12    Period Weeks    Status On-going    Target Date 08/16/20      OT LONG TERM GOAL #7   Title Pt. will prepare a simple cold snack from w/c with supervision using cognitive compensatory strategies 100% of the time.     Baseline 200th visit: Pt. has difficulty with getting the w/c close enough to access the refrigerator, and counters    Time 12    Period Weeks    Status Deferred      OT LONG TERM GOAL #8   Title Pt. will accurately identify potential safety hazard using good safety awareness, and judgement 100% for ADLs, and IADLs.    Baseline 200th visit: Pt. continues to require cues    Time 12    Period Weeks    Status On-going    Target Date 08/16/20      OT LONG TERM GOAL  #9   TITLE Pt. will  navigate his w/c around obstacles with Supervision and 100% accuracy    Baseline Pt. has progressed, however, continues to require cuing for obstacles on the left with fewer  cues overall. Pt. requires significant cues to engage his left UE during tasks.. 200th visit: continues to bump into walls at home with the w/ct at times    Time 12    Period Weeks    Status On-going    Target Date 08/16/20      OT LONG TERM GOAL  #10   TITLE Pt. will independently be able  to reach to place items into cabinetry, and closets.    Baseline 200th visit: Pt. continues to have difficulty with functional reaching continues to be limited.    Time 12    Period Weeks    Status On-going    Target Date 08/16/20      OT LONG TERM GOAL  #11   TITLE Pt. will increase BUE strength by 3mm  grades in preparation for ADL transfers.    Baseline 05/31/2020: Pt. is progressing, however BUE strength continues to be limited. Pt. is able to tolerate increased resistive weights.    Time 12    Period Weeks    Status On-going    Target Date 08/16/20      OT LONG TERM GOAL  #12   TITLE Pt. will improve LUE functional reaching with the left engaging the trunk 100% of the time during ADL tasks with minimal cues.    Baseline 200th visit: Pt. is limited with LUE functional reaching.    Time 12    Period Weeks    Status On-going    Target Date 08/16/20                   Plan - 08/02/20 1409     Clinical Impression Statement  Pt. continues to make steady progress with BUE strength, and tolerance for there. ex.  Pt. required fewer rest breaks throughout the session. Pt. required frequent verbal cues, and cues for visual demonstration for movement patterns, however required cues for visual demonstration. Pt. continues to require consistent verbal, visual, and tactile cues. Pt. continues to require cues for radial deviation. Pt. was able to maintain upright sitting in midline without a towel roll. Pt. continues to require less cues to perform UE strengthening exercises, technique, and form as well as visual demonstration prior to the exercises. Pt. continues to work on improving BUE strength, and [facility] skills in order to improve LUE functioning during ADLs, IADL tasks.     OT Occupational Profile and History Problem Focused Assessment - Including review of records relating to presenting problem    Occupational performance deficits (Please refer to evaluation for details): ADL's    Body Structure / Function / Physical Skills UE functional use;Coordination;FMC;Dexterity;Strength;ROM    Cognitive Skills Attention;Memory;Emotional;Problem Solve;Safety Awareness    Rehab Potential Good    Clinical Decision Making Several treatment options, min-mod task modification necessary    Comorbidities Affecting Occupational Performance: Presence of comorbidities impacting occupational performance    Comorbidities impacting occupational performance description: Phyical, cognitive, visual,  medical comorbidities    Modification or Assistance to Complete Evaluation  No modification of tasks or assist necessary to complete eval    OT Frequency 2x / week    OT Duration 12 weeks    OT Treatment/Interventions Self-care/ADL training;DME and/or AE instruction;Therapeutic exercise;Therapeutic activities;Moist Heat;Cognitive remediation/compensation;Neuromuscular education;Visual/perceptual remediation/compensation;Coping strategies  training;Patient/family education;Passive range of motion;Psychosocial skills training;Energy conservation;Functional Mobility Training    Consulted and Agree with Plan of Care Patient;Family member/caregiver    Family Member Consulted Caregiver Ch[place]             Patient will benefit from skilled therapeutic intervention in order to improve the following deficits and impairments:   Body Structure / Function / Physical Skills: UE functional use, Coordination, FMC, Dexterity, Strength, ROM Cognitive Skills: Attention, Memory, Emotional, Problem Solve, Safety Awareness     Visit Diagnosis: Muscle weakness (generalized)  Other lack of coordination    Problem List Patient Active Problem List   Diagnosis Date Noted   Sepsis ([place]) 10/18/2018   Long term (current) use of anticoagulants 05/01/2018   History of stroke 04/11/2018   Hyperlipidemia 07/04/2017   Chronic diarrhea 06/12/2017   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 [place], [place], [address] Phone: [phone]   Fax:  [phone]  Name: QUASHAUN LAZALDE MRN: [id number] Date of Birth: Mar 09, 1945

## 2020-08-04 ENCOUNTER — Other Ambulatory Visit: Payer: Self-pay

## 2020-08-04 ENCOUNTER — Ambulatory Visit: Payer: Medicare Other

## 2020-08-04 ENCOUNTER — Ambulatory Visit: Payer: Medicare Other | Admitting: Occupational Therapy

## 2020-08-04 ENCOUNTER — Encounter: Payer: Self-pay | Admitting: Occupational Therapy

## 2020-08-04 DIAGNOSIS — R262 Difficulty in walking, not elsewhere classified: Secondary | ICD-10-CM

## 2020-08-04 DIAGNOSIS — R278 Other lack of coordination: Secondary | ICD-10-CM

## 2020-08-04 DIAGNOSIS — R269 Unspecified abnormalities of gait and mobility: Secondary | ICD-10-CM

## 2020-08-04 DIAGNOSIS — M6281 Muscle weakness (generalized): Secondary | ICD-10-CM

## 2020-08-04 NOTE — Therapy (Addendum)
[place] MAIN [facility] SERVICES [address] [place], [place], [address] Phone: [phone]   Fax:  [phone]  Occupational Therapy Treatment  Patient Details  Name: Kenneth Summers MRN: [id number] Date of Birth: May 28, 1945 No data recorded  Encounter Date: 08/04/2020   OT End of Session - 08/04/20 1358     Visit Number 206    Number of Visits 254    Date for OT Re-Evaluation 08/16/20    Authorization Type Progress report period starting 05/31/2020    OT Start Time 1345    OT Stop Time 1430    OT Time Calculation (min) 45 min    Activity Tolerance Patient tolerated treatment well    Behavior During Therapy WFL for tasks assessed/performed             Past Medical History:  Diagnosis Date   Actinic keratosis    Alcohol abuse    APS (antiphospholipid syndrome) (HCC)    Basal cell carcinoma    BPH (benign prostatic hyperplasia)    CAD (coronary artery disease)    Cellulitis    Cervical spinal stenosis    Chronic osteomyelitis (HCC)    CKD (chronic kidney disease)    CKD (chronic kidney disease)    Clostridium difficile diarrhea    Depression    Diplopia    Fatigue    GERD (gastroesophageal reflux disease)    Hyperlipemia    Hypertension    Hypovitaminosis D    IBS (irritable bowel syndrome)    IgA deficiency (HCC)    Insomnia    Left lumbosacral radiculopathy    Moderate obstructive sleep apnea    Osteomyelitis of foot (HCC)    PAD (peripheral artery disease) (HCC)    Pruritus    RA (rheumatoid arthritis) (HCC)    Radiculopathy    Restless legs syndrome    RLS (restless legs syndrome)    SLE (systemic lupus erythematosus related syndrome) ([place])    Squamous cell carcinoma of skin 01/27/2019   right mid lower ear helix   Stroke ([place])    Toxic maculopathy of both eyes     Past Surgical History:  Procedure Laterality Date   CARDIAC CATHETERIZATION     CAROTID ENDARTERECTOMY     CERVICAL LAMINECTOMY      CORONARY ANGIOPLASTY     FRACTURE SURGERY     fusion C5-6-7     HERNIA REPAIR     KNEE ARTHROSCOPY     TONSILLECTOMY      There were no vitals filed for this visit.   Subjective Assessment - 08/04/20 1419     Subjective  Pt. reports that he forgot to take his medicine last night.    Patient is accompanied by: Family member    Pertinent History Pt. is a 75 y.o. 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 Pt. PMHx includes: Multiple Falls, Lupus, DJD, Renal Abscess, CVA. Pt. resides with his wife. Pt.'s wife and daughter assist with ADLs. Pt. has caregivers in  for 2 hours a day, 6 days a week. Pt. received Rehab services in acute care, at SNF for STR, and [place] services. Pt. is retired from [employer] for [employer] and [employer].    Patient Stated Goals To regain use of his left UE, and do more for himself.    Currently in Pain? Yes    Pain Score 7     Pain Location Abdomen    Pain  Orientation Left    Pain Descriptors / Indicators Aching    Pain Onset Today            OT TREATMENT     Therapeutic Exercise:   Pt. worked on BUE strengthening, and reciprocal motion using the UBE while seated for 8 min. with no resistance.  Pt. worked on 3# dumbbell ex. for bilateral elbow flexion and extension, and right supination/pronation,  and 2# for the left wrist extension.   Pt. Repots 7/10 left sided torso pain. Pt. continues to make steady progress with BUE strength, and tolerance for there. ex. Pt. required more frequent rest breaks throughout the session. Pt. required frequent verbal cues, and cues for visual demonstration for movement patterns, and cues for visual demonstration. Pt. continues to require consistent verbal, visual, and tactile cues. Pt. continues to require cues for radial deviation. Pt. was able to maintain upright sitting in midline without a towel roll. Pt. required increased cues to perform UE strengthening exercises, technique, and  form as well as visual demonstration prior to the exercises. Pt. continues to work on improving BUE strength, and [facility] skills in order to improve LUE functioning during ADLs, IADL tasks. Pt. became sick to his stomache at the end of the session, and vomited. Pt. was assisted, and comfort was provided.                  OT Education - 08/04/20 1358     Education Details strengthening, UE functioning    Person(s) Educated Patient;Caregiver(s)    Methods Explanation;Demonstration;Handout    Comprehension Returned demonstration;Tactile cues required;Verbalized understanding;Need further instruction                 OT Long Term Goals - 07/12/20 1408       OT LONG TERM GOAL #1   Title Pt. will increase left shoulder flexion AROM by 10 degrees to access cabinet/shelf.    Baseline 05/31/2020: Left shoulder flexion 119 degrees. Pt. is able to reach into cabinets with more ease, however has difficulty getting close enough to them with his w/c.Pt. continues to present with limited left shoulder ROM limitng access to cabinets, and shelves.03/01/2020: Left shoulder 116 degrees with new onset of numbness radiating from the left side of the neck to the hand. Pt. continues to progress with left shoulder ROM .Shouler flexion 130, and continues to work on reaching with increasing emphasis on flexing trunk to improve functional reach. 200th visit: shoulder flexion to 130 with effort    Time 12    Period Weeks    Status On-going    Target Date 08/16/20      OT LONG TERM GOAL #2   Title Pt. will donn a shirt with Supervision.    Baseline 05/31/2020: [place]. Pt. is assisting more with UE dressing at home. Pt. continues to require modA donning a zip down jacket with consistent cues to initiate the correct direction of the jacket.  Min-modA mahaging the zipper. MinA for donning a T-shirt with assist to roll side to side to pull the shirt down over his back. 200th visit: continues to require min assist  at times for shirt/jacket    Time 12    Period Weeks    Status On-going    Target Date 08/16/20      OT LONG TERM GOAL #3   Title Pt. will require ModA to perform LE dressing    Period Weeks      OT LONG TERM GOAL #4  Title Pt. will improve left grip strength by 10# to be able to open a jar/container.    Baseline Grip strength continues to be limited. Decreased left sided grip strength today secondary to a new onset of numbenss, and weakness radiating from the neck to the left hand. 200th visit: Pt. continues to have a harder time opening a wide mouthed jar, however is able to open a smaller jar easier.    Time 12    Period Weeks    Status On-going    Target Date 08/16/20      OT LONG TERM GOAL #5   Title Pt. will improve left hand [facility] skills to be able to assist with buttoning/zipping.    Baseline 05/24/2020: Pt. is improving with fastening small buttons. Pt. continues to work on improving [facility] skills  to improve buttoning , and zipping.03/01/2020: Pt.has had a new onset of weakness, and numbness in the left hand beginning upon waking up this morning which has resulted in a significant change in [facility] skills.  Pt. has improved overall donning a jacket, and  manipulating the zipper on his jacket, however continues to require practice depending on the type of zipper jacket. Pt. continues to work on improving bilateral [facility] skills for buttoning/zipping.  200th visit: Pt. has improved with buttons and zippers, however has difficulty manipulating buttons up higher on the shirt.    Time 12    Period Weeks    Status Achieved      OT LONG TERM GOAL #6   Title Pt. will demonstrate visual conmpensatory strategies for 100% of the time during ADLs    Baseline Improving, however Pt. continues to require intermittent cues cues for left sides awareness.    Time 12    Period Weeks    Status On-going    Target Date 08/16/20      OT LONG TERM GOAL #7   Title Pt. will prepare a simple cold snack from w/c  with supervision using cognitive compensatory strategies 100% of the time.    Baseline 200th visit: Pt. has difficulty with getting the w/c close enough to access the refrigerator, and counters    Time 12    Period Weeks    Status Deferred      OT LONG TERM GOAL #8   Title Pt. will accurately identify potential safety hazard using good safety awareness, and judgement 100% for ADLs, and IADLs.    Baseline 200th visit: Pt. continues to require cues    Time 12    Period Weeks    Status On-going    Target Date 08/16/20      OT LONG TERM GOAL  #9   TITLE Pt. will  navigate his w/c around obstacles with Supervision and 100% accuracy    Baseline Pt. has progressed, however, continues to require cuing for obstacles on the left with fewer  cues overall. Pt. requires significant cues to engage his left UE during tasks.. 200th visit: continues to bump into walls at home with the w/ct at times    Time 12    Period Weeks    Status On-going    Target Date 08/16/20      OT LONG TERM GOAL  #10   TITLE Pt. will independently be able  to reach to place items into cabinetry, and closets.    Baseline 200th visit: Pt. continues to have difficulty with functional reaching continues to be limited.    Time 12    Period Weeks  Status On-going    Target Date 08/16/20      OT LONG TERM GOAL  #11   TITLE Pt. will increase BUE strength by 15mm grades in preparation for ADL transfers.    Baseline 05/31/2020: Pt. is progressing, however BUE strength continues to be limited. Pt. is able to tolerate increased resistive weights.    Time 12    Period Weeks    Status On-going    Target Date 08/16/20      OT LONG TERM GOAL  #12   TITLE Pt. will improve LUE functional reaching with the left engaging the trunk 100% of the time during ADL tasks with minimal cues.    Baseline 200th visit: Pt. is limited with LUE functional reaching.    Time 12    Period Weeks    Status On-going    Target Date 08/16/20                    Plan - 08/04/20 1358     Clinical Impression Statement Pt. Repots 7/10 left sided torso pain. Pt. continues to make steady progress with BUE strength, and tolerance for there. ex. Pt. required more frequent rest breaks throughout the session. Pt. required frequent verbal cues, and cues for visual demonstration for movement patterns, and cues for visual demonstration. Pt. continues to require consistent verbal, visual, and tactile cues. Pt. continues to require cues for radial deviation. Pt. was able to maintain upright sitting in midline without a towel roll. Pt. required increased cues to perform UE strengthening exercises, technique, and form as well as visual demonstration prior to the exercises. Pt. continues to work on improving BUE strength, and [facility] skills in order to improve LUE functioning during ADLs, IADL tasks. Pt. became sick to his stomache at the end of the session, and vomited. Pt. was assisted, and comfort was provided.     OT Occupational Profile and History Problem Focused Assessment - Including review of records relating to presenting problem    Occupational performance deficits (Please refer to evaluation for details): ADL's    Body Structure / Function / Physical Skills UE functional use;Coordination;FMC;Dexterity;Strength;ROM    Cognitive Skills Attention;Memory;Emotional;Problem Solve;Safety Awareness    Rehab Potential Good    Clinical Decision Making Several treatment options, min-mod task modification necessary    Comorbidities Affecting Occupational Performance: Presence of comorbidities impacting occupational performance    Modification or Assistance to Complete Evaluation  No modification of tasks or assist necessary to complete eval    OT Frequency 2x / week    OT Duration 12 weeks    OT Treatment/Interventions Self-care/ADL training;DME and/or AE instruction;Therapeutic exercise;Therapeutic activities;Moist Heat;Cognitive  remediation/compensation;Neuromuscular education;Visual/perceptual remediation/compensation;Coping strategies training;Patient/family education;Passive range of motion;Psychosocial skills training;Energy conservation;Functional Mobility Training    Consulted and Agree with Plan of Care Patient;Family member/caregiver             Patient will benefit from skilled therapeutic intervention in order to improve the following deficits and impairments:   Body Structure / Function / Physical Skills: UE functional use, Coordination, FMC, Dexterity, Strength, ROM Cognitive Skills: Attention, Memory, Emotional, Problem Solve, Safety Awareness     Visit Diagnosis: Muscle weakness (generalized)  Other lack of coordination    Problem List Patient Active Problem List   Diagnosis Date Noted   Sepsis ([place]) 10/18/2018   Long term (current) use of anticoagulants 05/01/2018   History of stroke 04/11/2018   Hyperlipidemia 07/04/2017   Chronic diarrhea 06/12/2017   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 ([place]) 04/27/2016   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   Fatigue 02/06/2015   Visual changes 06/19/2013   Benign prostatic hyperplasia 01/07/2013   Depression 01/07/2013   Restless legs syndrome 01/07/2013   Chronic kidney disease, stage 3 unspecified ([place]) 12/20/2011   CAD (coronary artery disease) 11/03/2010   Hypertension 11/03/2010   PAD (peripheral artery disease) ([place]) 11/03/2010   SLE (systemic lupus erythematosus) ([place]) 11/03/2010    Harrel Carina, MS, OTR/L 08/04/2020, 2:33 PM  [place] MAIN [facility] SERVICES [address] [place], [place], [address] Phone: [phone]   Fax:  [phone]  Name: JALYN ROSERO MRN: [id number] Date of Birth: 1945-09-13

## 2020-08-05 ENCOUNTER — Other Ambulatory Visit: Payer: Medicare Other | Admitting: Primary Care

## 2020-08-05 DIAGNOSIS — Z8673 Personal history of transient ischemic attack (TIA), and cerebral infarction without residual deficits: Secondary | ICD-10-CM

## 2020-08-05 DIAGNOSIS — Z515 Encounter for palliative care: Secondary | ICD-10-CM

## 2020-08-05 DIAGNOSIS — M05772 Rheumatoid arthritis with rheumatoid factor of left ankle and foot without organ or systems involvement: Secondary | ICD-10-CM

## 2020-08-05 DIAGNOSIS — R5382 Chronic fatigue, unspecified: Secondary | ICD-10-CM

## 2020-08-05 DIAGNOSIS — G2581 Restless legs syndrome: Secondary | ICD-10-CM

## 2020-08-05 NOTE — Progress Notes (Signed)
Therapist, nutritional Palliative Care Consult Note Telephone: [phone]  Fax: [phone]    Date of encounter: 08/05/20 PATIENT NAME: Kenneth Summers [address] [place] [address]   [phone] (home)  DOB: 02-06-1946 MRN: [id number] PRIMARY CARE PROVIDER:    Lorenso Quarry, NP,  [address] [place] [address] [phone]  REFERRING PROVIDER:   Lorenso Quarry, NP [address] [place],  [place] [address] [phone]  RESPONSIBLE PARTY:    Contact Information     Name Relation Home Work Mobile   Kenneth Summers,Kenneth Summers Daughter [phone]     Kenneth Summers, Kenneth Summers [phone]          I met face to face with patient and family in  home. Palliative Care was asked to follow this patient by consultation request of  Kenneth Quarry, NP to address advance care planning and complex medical decision making. This is a follow up visit.                                   ASSESSMENT AND PLAN / RECOMMENDATIONS:   Advance Care Planning/Goals of Care: Goals include to maximize quality of life and symptom management. Our advance care planning conversation included a discussion about:    Exploration of personal, cultural or spiritual beliefs that might influence medical decisions  CODE STATUS: DNR  Symptom Management/Plan:   Continuity of care: Main concerns were for skin conditions and continuity of care. Family outlines that they would like/ are  seeking home-based RN care. We discuss the role of palliative as a consultative role to help with Goals of care and symptom management. I outlined the differences between consultative service and in-home care such as home health. He would not qualify for  medicare reimbursed home health skilled RN at this juncture.I encouraged them to call insurance company to see if they provide in home managed care visits by RN or NP.  Immobility Has been going to PT, OT. Nearing completion of out pt course with met  goals.   Dry eyes :  Rx with recently prescribed drops. This has improved.    Skin breakdown in diaper area:  He does occasionally have incontinence and pressure related recurrent skin breakdown but currently family reports this is resolved. I examined, no sign of skin rash or breakdown in groin or sacrum. I also have re -recommended them to purchase inter-dry product for non-incontinence moisture and skin management.  We discussed dietary changes for chronic candidiasis infection, see below. Also suggested dermatology consultation for bx to determine etiology of rash.   Loose stools: has history of with excoriation. Recommended pro biotics and / or daily active culture yogurt.  Also discussed limiting various dietary components (gluten, dairy) to see if bowels improve.  Scalp dryness :Continues to use ketoconazole  2% shampoo, I have send new prescription for this to [facility].   Restless leg: Improved with increase in gabapentin to 1200 mg at hs. Neurology did increase. Reports increase in seizure activity, Rx with Lamictal.  Follow up Palliative Care Visit: Palliative care will continue to follow for complex medical decision making, advance care planning, and clarification of goals. Return 12 weeks or prn.  I spent 60 minutes providing this consultation. More than 50% of the time in this consultation was spent in counseling and care coordination.  PPS: 30%  HOSPICE ELIGIBILITY/DIAGNOSIS: TBD  Chief Complaint: debility  HISTORY OF PRESENT ILLNESS:  Kenneth Summers is a 75 y.o. year old male  with debility, Lupus, debility, immobility, seizure disorder, chronic skin infection. Presents with ongoing debility making medical f/u difficult. Family has concerns RE his skin integrity and mobility .   History obtained from review of EMR, discussion with primary team, and interview with family, facility staff/caregiver and/or Kenneth Summers.  I reviewed available labs, medications, imaging,  studies and related documents from the EMR.  Records reviewed and summarized above.   ROS   General: NAD EYES: endorses dry eyes ENMT: denies dysphagia Cardiovascular: denies chest pain, denies DOE Pulmonary: denies cough, denies increased SOB Abdomen: endorses good appetite, denies constipation, endorses incontinence of bowel GU: denies dysuria, endorses incontinence of urine MSK:  endorses weakness,  no falls reported Skin: endorses skin  rashes  Neurological: denies pain, denies insomnia Psych: Endorses positive mood Heme/lymph/immuno: denies bruises, abnormal bleeding  Physical Exam: Current and past weights: 250  lb estimated Constitutional: NAD General: frail appearing, obese  EYES: anicteric sclera, lids intact, no discharge  ENMT: intact hearing, oral mucous membranes moist CV: S1S2, RRR, no LE edema Pulmonary: LCTA, no increased work of breathing, no cough, room air Abdomen: intake 100%, normo-active BS + 4 quadrants, soft and non tender, no ascites GU: deferred MSK: ++ sarcopenia, unable to move all extremities (L foot) ,  non ambulatory Skin: warm and dry, no rashes or wounds on visible skin Neuro:  ++ generalized weakness,  ++ cognitive impairment Psych: non-anxious affect, A and O x 2 Hem/lymph/immuno: no widespread bruising   Thank you for the opportunity to participate in the care of Kenneth Summers.  The palliative care team will continue to follow. Please call our office at [phone] if we can be of additional assistance.   Kenneth Coop, NP , DNP, MPH, AGPCNP-BC, ACHPN  COVID-19 PATIENT SCREENING TOOL Asked and negative response unless otherwise noted:   Have you had symptoms of covid, tested positive or been in contact with someone with symptoms/positive test in the past 5-10 days?

## 2020-08-05 NOTE — Therapy (Signed)
[place] MAIN [facility] SERVICES [address] [place], [place], [address] Phone: [phone]   Fax:  [phone]  Physical Therapy Treatment  Patient Details  Name: Kenneth Summers MRN: [id number] Date of Birth: May 17, 1945 No data recorded  Encounter Date: 08/04/2020   PT End of Session - 08/04/20 0919     Visit Number 255    Number of Visits 301    Date for PT Re-Evaluation 10/06/20    Authorization Type Medicare reporting period starting 09/11/18; NO FOTO    Authorization Time Period Recert 08/14/6281-07/12/2945; Recert 07/11/4648-3/54/6568    PT Start Time 1300    PT Stop Time 1330    PT Time Calculation (min) 30 min    Equipment Utilized During Treatment Gait belt    Activity Tolerance No increased pain;Patient limited by lethargy;Patient limited by fatigue    Behavior During Therapy [facility] for tasks assessed/performed             Past Medical History:  Diagnosis Date   Actinic keratosis    Alcohol abuse    APS (antiphospholipid syndrome) (HCC)    Basal cell carcinoma    BPH (benign prostatic hyperplasia)    CAD (coronary artery disease)    Cellulitis    Cervical spinal stenosis    Chronic osteomyelitis (HCC)    CKD (chronic kidney disease)    CKD (chronic kidney disease)    Clostridium difficile diarrhea    Depression    Diplopia    Fatigue    GERD (gastroesophageal reflux disease)    Hyperlipemia    Hypertension    Hypovitaminosis D    IBS (irritable bowel syndrome)    IgA deficiency (HCC)    Insomnia    Left lumbosacral radiculopathy    Moderate obstructive sleep apnea    Osteomyelitis of foot (HCC)    PAD (peripheral artery disease) (HCC)    Pruritus    RA (rheumatoid arthritis) (HCC)    Radiculopathy    Restless legs syndrome    RLS (restless legs syndrome)    SLE (systemic lupus erythematosus related syndrome) ([place])    Squamous cell carcinoma of skin 01/27/2019   right mid lower ear helix   Stroke ([place])     Toxic maculopathy of both eyes     Past Surgical History:  Procedure Laterality Date   CARDIAC CATHETERIZATION     CAROTID ENDARTERECTOMY     CERVICAL LAMINECTOMY     CORONARY ANGIOPLASTY     FRACTURE SURGERY     fusion C5-6-7     HERNIA REPAIR     KNEE ARTHROSCOPY     TONSILLECTOMY      There were no vitals filed for this visit.   Subjective Assessment - 08/05/20 0918     Subjective Patient reports did not sleep well last night so tired today.    Patient is accompained by: Family member    Pertinent History Patient is a 75 y.o. male who presents to outpatient physical therapy with a referral for medical diagnosis of CVA. This patient's chief complaints consist of left hemiplegia and overall deconditioning leading to the following functional deficits: dependent for ADLs, IADLs, unable to transfer to car, dependent transfers at home with hoyer lift, unable to walk or stand without significant assistance, difficulty with W/C navigation..    Limitations Sitting;Lifting;Standing;House hold activities;Writing;Walking;Other (comment)    Diagnostic tests Brain MRI 11/01/2017: IMPRESSION: 1. Acute right ACA territory infarct as demonstrated on prior CT imaging. No intracranial hemorrhage  or significant mass effect. 2. 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    Patient Stated Goals wants to be able to walk again    Currently in Pain? No/denies    Pain Onset Today    Pain Onset Today              Interventions:   Stand pivot transfer- Max assist to scoot forward and sit upright. He required Max A +1 with another person CGA to transfer from chair to mat and later back to chair. He was unable to extend knees with standing today.   Sitting on edge of mat:  Sitting postural exercises= 10 min of static sitting while beginning visit-CGA provided by caregiver and max VC to sit upright without listing to left. Patient kept slumping forward.  Attempted to  stand from mat using max A +2 people and patient was able to stand briefly unable to maintain x 2 attempts- slumping forward and exhibiting increased weakness. Patient positioned back to sitting position but states too weak to continue. He was transferred back to power chair with Max A.   Attempted some LE exercises- AAROM left hip and knee flex/ext x 10 reps and AROM right hip flex and knee ext x 10 reps.  Treatment stopped secondary to patient too fatigued to continue.   Clinical Impression: Patient presents with increased weakness throughout session - unable to maintain standing and max A +2 to stand today. He was unable to participate well and max difficulty maintaining 10 min of static sitting at edge of mat. He stated he did sleep well which may have contributed to his abilities today. He will continue to benefit from skilled PT services to improve his functional strength and postural control for improved transfers/standing                        PT Education - 08/05/20 0918     Education Details exercise and posture technique    Person(s) Educated Patient    Methods Explanation;Demonstration;Tactile cues;Verbal cues;Other (comment)    Comprehension Verbalized understanding;Tactile cues required;Need further instruction;Returned demonstration;Verbal cues required              PT Short Term Goals - 07/14/20 0721       PT SHORT TERM GOAL #1   Title Be independent with initial home exercise program for self-management of symptoms.    Baseline HEP to be given at second session; advice to practice unsupported sitting at home (06/19/2018); 07/08/18: Performing at home, 6/29: needs assistance but is doing exercise program regularly; 08/22/18: adherent;    Time 2    Period Weeks    Status Achieved    Target Date 06/26/18      PT SHORT TERM GOAL #2   Title Patient will perform sit to stand transfer with mod Assist in parallel bars for improved transfer ability with  decreased caregiver assist.    Baseline 04/19/2020- Max assist of 1 person. 07/14/2020- Patient continues to require Max assist for sit to stand transfers    Time 6    Period Weeks    Status On-going    Target Date 08/25/20               PT Long Term Goals - 07/21/20 0723       PT LONG TERM GOAL #1   Title Patient will complete all bed mobility with min A to improve functional independence for getting in and  out of bed and adjusting in bed.     Baseline max A - total A reported by family (06/19/2018); 07/08/18: still requires maxA, dtr reports improvement in rolling since starting therapy; 6/23: min A to supervision in clinic; not doing at home right; 8/5: Pt's daughter states that his rolling has improved, but still has difficulty with all other bed mobility; 10/23/18: pts daughter states that she is doing about 35% of the work if he is in proper position, he does uses the bed rails to help roll;  using Harrel Lemon for coming to sit EOB; 10/12: pts daughter states that she is doing about 30% of the work if he is in proper position, he does uses the bed rails to help roll, but it is improving;  using Harrel Lemon for coming to sit EOB, 10/29: min A for sit to sidelying, rolling supervision, able to transition sidelying to sit with HHA; 12/21: min-mod A. 03/01/2020- Patient presents improving bed mobility with current ability to roll to left with CGA and verbal and tactile cues and min assist to roll to right as seen by recent visits. 03/31/2020- Patient and caregiver report that he has maintained ability to roll side to side with only minimal assistance.    Time 12    Period Weeks    Status Achieved      PT LONG TERM GOAL #2   Title Patient will complete sit <> stand transfer chair to chair with Min A and LRAD to improve functional independence for household and community mobility.     Baseline min A for STS, still requires elevated surface height to 27".  03/01/20- Patient currently unable to perform a chair  to chair transfer and continues to be transferred using hoyer lift. Patient has been training with sit to stand at parallel bars and able to currently perform sit to stand with max assist  from 27in. high surface.03/31/2020- Patient remains at [facility] assist with sit to stand transfer and continued use of hoyer lift transfer for all chair to chair or bed to chair transfers. 04/19/2020- Patient requires Max assist with sit to stand transfers and requires hoyer for chair to chair transfers. 07/14/2020- Patient has continued to require max assist since Feb for sit to stand transfers- Do not anticipate that this is a realistic goal at this time.    Time 12    Period Weeks    Status Not Met    Target Date --   Goal no longer appropriate.     PT LONG TERM GOAL #3   Title Patient will navigate power w/c with min A x 100 feet to improve mobility for household and short community distances.    Baseline required total A (06/12/2018); able to roll 20 feet with min A (06/20/2018); 07/08/18: Pt can go approximately 10' without assist per family;  10/23/2018: pt's daughter states that his power WC mobility has become 100% better with the new hand control, slowed speed, and with practice; 10/12: pt's daughter reports that he can perform household WC mobility and limited community ambulation with minA    Time 12    Period Weeks    Status Achieved      PT LONG TERM GOAL #4   Title Patient will complete car transfers W/C to family SUV with min A using LRAD to improve his abilty to participate in community activities.     Baseline unable to complete car transfers at all (06/12/2018); unable to complete car transfers at all (06/19/2018).; 07/08/18: Karen Kays  to perform at this time; 09/11/18: unable to perform at this time; 10/23/2018: unable to complete car transfers at all; 10/12:  unable to complete car transfers at all, 10/29: unable; 01/13/19: unable; 2/11: min-mod A +2, 3/10: min A+1, 12/21: mod A. 03/01/2020- Patient unable to complete car  transfers and continues to require medical transportation at this time. 3/14- Patient reports he may be purchasing a conversion van later this week.07/14/2020- Patient did not purchase the conversion Lucianne Lei and continues to require medical transport for all outings using his wheelchair.    Time 12    Period Weeks    Status Deferred    Target Date 10/06/20      PT LONG TERM GOAL #5   Title Patient will ambulate at least 10 feet with min A using LRAD to improve mobility for household and community distances.    Baseline unable to take steps (06/12/2018); able to shift R leg forward and back with max A and RW at edge of plinth (06/19/2018); 07/08/18: unable to ambulate at this time. 09/11/18: unable to perform at this time; 10/23/2018: unable to perform at this time; 10/12: unable to perform at this time, 10/29: unable at this time; 01/13/19: unable at this time, 2/11: min A to step with RLE, unable to step with LLE, 3/10: able to take 2 steps in parallel bars with min A for safety; 4/1: min A for walking 10 feet in parallel bars, 5/24: min A walking 10 feet in parallel bars; 6/21: no change 7/14: no change. 03/01/2020- Patient unable to ambulate and currently focusing on static standing. He was able to take a forward step with his right LE last week but unable today. 03/31/2020- Patient continues to be non-ambulatory but has been able to move right LE forward with standing but unable to take a step with left LE. 04/19/2020- Goal is still not appropriate as of yet- cont to focus on static and dynamic standing balance/pregait activities. 07/14/2020- Goal still not appropriate- working on ability to dynamically weight shift for transfers and continuing with static transfers.    Time 12    Period Weeks    Status Deferred    Target Date 10/06/20      Additional Long Term Goals   Additional Long Term Goals Yes      PT LONG TERM GOAL #6   Title Patient will be able to safety navigate up/down ramp with power chair, exhibiting  good safety awareness, mod I to safely enter/exit his home.    Baseline 2/11: supervision with intermittent min A;4/1: supervision; 5/24: supervision. 03/01/2020- patient reports that he is able to navigate down the ramp at home to leave the home but continues to need assist to navigate up the ramp into the home. 07/14/2020- Patient reports able to negotiate w/c in/out of home.    Time 12    Period Weeks    Status Achieved      PT LONG TERM GOAL #7   Title Patient will increase functional reach test to >15 inches in sitting to exhibit improved sitting balance and positioning and reduce fall risk;    Baseline 07/30/18: 12 inches; 09/11/18: 10-12 inches; 10/23/18: 12-13 inch; 10/12: 14.5", 10/29: 15.5 inch    Time 4    Period Weeks    Status Achieved      PT LONG TERM GOAL #8   Title Patient will increase LLE quad strength to 3+/5 to improve functional strength for standing and mobility;    Baseline 10/28: 2+/5;  01/13/19: 2+/5, 2/11: 3-/5, 04/16/19: 3-/5, 05/08/19: 3-/5, 5/24: 3-/5, 6/21: 3-/5, 7/14: 3-/5, 12/21: 3-/5. 03/01/2020- Left quad= 3-/5 (focusing on quad strengthening during visits and with home program). 03/31/2020- left quad = 3-/5 with MMT. 04/19/2020= 3- /5 with left quad strength with manual muscle testing. 07/14/2020- Patient presents with 3-/5 and quickly fatigues with knee ext yet functionall able to stand of left LE without knee buckling- yet unable to terminally extend left knee with static standing.    Time 12    Period Weeks    Status On-going    Target Date 10/06/20      PT LONG TERM GOAL  #9   TITLE Patient will report no vertigo with provoking motions or positions in order to be able to perform ADLs and mobility tasks without symptoms.    Baseline 06/30/19: reports no dizziness in last week; 6/21: one episode this weekend; 7/14: experiencing one a week; 03/01/2020.- Patient has denied any dizziness for over past 3 weeks.    Time 12    Period Weeks    Status Achieved      PT LONG  TERM GOAL  #10   TITLE Patient will demonstrate improved functional LE/Postural strength as seen by ability to perform static standing for 2 min or greater with max assist  to assist with strength required for ADL's and transfers/pregait/gait activities.    Baseline 03/01/2020- Patient able to stand approx 40 sec today with max assist in parallel bars. 03/31/2020- Patient able to demo static stand with max A for 66min 47 sec today. 04/19/2020- Patient able to demo 2 min today with mod/max assist - yet remains inconsistent. 07/14/2020- Did not attempt today but during past visits during this cert- patient has been inconsistent standing for 30 sec to 2 min. Will keep goal active to strive for more consistency to help with strength for transfers.    Time 12    Period Weeks    Status On-going    Target Date 10/06/20      PT LONG TERM GOAL  #11   TITLE Patient will demonstrate improved static sitting- able to sit unsupported at back for 5 min without evidence of listing to left for improved sitting and postural control to prevent excessive pressure through left hip/arm with sitting.    Baseline 07/14/2020- Patient presents with progressive listing to left side over past month increasing risk for skin breakdown, pain in low back, and long term postural abnormalites.    Time 12    Period Weeks    Status New    Target Date 10/06/20                   Plan - 08/04/20 0920     Clinical Impression Statement Patient presents with increased weakness throughout session - unable to maintain standing and max A +2 to stand today. He was unable to participate well and max difficulty maintaining 10 min of static sitting at edge of mat. He stated he did sleep well which may have contributed to his abilities today. He will continue to benefit from skilled PT services to improve his functional strength and postural control for improved transfers/standing    Personal Factors and Comorbidities Age;Comorbidity 3+     Comorbidities Relevant past medical history and comorbidities include long term steroid use for lupus, CKD, chronic osteomyelitis, cervical spine stenosis, BPH, APS, alcohol abuse, peripheral artery disease, depression, GERD, obstructive sleep apnea, HTN, IBS, IgA deficiency, lumbar radiculopathy, RA, Stroke, toxic maculopathy in  both eyes, systemic lupus erythematosus related syndrome, cardiac catheterization, carotid endarterectomy, cervical laminectomy, coronary angioplasty, Fusion C5-C7, knee arthroplasty, lumbar surgery.    Examination-Activity Limitations Bed Mobility;Bend;Sit;Toileting;Stand;Stairs;Lift;Transfers;Squat;Locomotion Level;Carry;Dressing;Hygiene/Grooming;Continence    Examination-Participation Restrictions Yard Work;Interpersonal Relationship;Community Activity    Stability/Clinical Decision Making Evolving/Moderate complexity    Rehab Potential Fair    PT Frequency 2x / week    PT Duration 12 weeks    PT Treatment/Interventions ADLs/Self Care Home Management;Electrical Stimulation;Moist Heat;Cryotherapy;Gait training;Stair training;Functional mobility training;Therapeutic exercise;Balance training;Neuromuscular re-education;Cognitive remediation;Patient/family education;Orthotic Fit/Training;Wheelchair mobility training;Manual techniques;Passive range of motion;Energy conservation;Joint Manipulations;Canalith Repostioning;Therapeutic activities;Vestibular    PT Next Visit Plan Continue with progressive postural/LE strengthening/pregait activities    PT Home Exercise Plan no changes    Consulted and Agree with Plan of Care Patient;Family member/caregiver             Patient will benefit from skilled therapeutic intervention in order to improve the following deficits and impairments:  Abnormal gait, Decreased activity tolerance, Decreased cognition, Decreased endurance, Decreased knowledge of use of DME, Decreased range of motion, Decreased skin integrity, Decreased strength,  Impaired perceived functional ability, Impaired sensation, Impaired UE functional use, Improper body mechanics, Pain, Cardiopulmonary status limiting activity, Decreased balance, Decreased coordination, Decreased mobility, Difficulty walking, Impaired tone, Postural dysfunction, Dizziness  Visit Diagnosis: Abnormality of gait and mobility  Difficulty in walking, not elsewhere classified  Muscle weakness (generalized)  Other lack of coordination     Problem List Patient Active Problem List   Diagnosis Date Noted   Sepsis ([place]) 10/18/2018   Long term (current) use of anticoagulants 05/01/2018   History of stroke 04/11/2018   Hyperlipidemia 07/04/2017   Chronic diarrhea 06/12/2017   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 ([place]) 04/27/2016   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   Fatigue 02/06/2015   Visual changes 06/19/2013   Benign prostatic hyperplasia 01/07/2013   Depression 01/07/2013   Restless legs syndrome 01/07/2013   Chronic kidney disease, stage 3 unspecified ([place]) 12/20/2011   CAD (coronary artery disease) 11/03/2010   Hypertension 11/03/2010   PAD (peripheral artery disease) ([place]) 11/03/2010   SLE (systemic lupus erythematosus) ([place]) 11/03/2010    Lewis Moccasin, PT 08/05/2020, 12:54 PM  [place] MAIN [facility] SERVICES [address] [place], [place], [address] Phone: [phone]   Fax:  [phone]  Name: Kenneth Summers MRN: [id number] Date of Birth: [birth year]

## 2020-08-11 ENCOUNTER — Ambulatory Visit: Payer: Medicare Other | Attending: Family Medicine

## 2020-08-11 ENCOUNTER — Encounter: Payer: Self-pay | Admitting: Occupational Therapy

## 2020-08-11 ENCOUNTER — Ambulatory Visit: Payer: Medicare Other | Admitting: Occupational Therapy

## 2020-08-11 ENCOUNTER — Other Ambulatory Visit: Payer: Self-pay

## 2020-08-11 DIAGNOSIS — R2689 Other abnormalities of gait and mobility: Secondary | ICD-10-CM | POA: Insufficient documentation

## 2020-08-11 DIAGNOSIS — R2681 Unsteadiness on feet: Secondary | ICD-10-CM | POA: Diagnosis present

## 2020-08-11 DIAGNOSIS — R278 Other lack of coordination: Secondary | ICD-10-CM | POA: Insufficient documentation

## 2020-08-11 DIAGNOSIS — M6281 Muscle weakness (generalized): Secondary | ICD-10-CM | POA: Diagnosis present

## 2020-08-11 DIAGNOSIS — R269 Unspecified abnormalities of gait and mobility: Secondary | ICD-10-CM | POA: Diagnosis not present

## 2020-08-11 DIAGNOSIS — I69354 Hemiplegia and hemiparesis following cerebral infarction affecting left non-dominant side: Secondary | ICD-10-CM | POA: Insufficient documentation

## 2020-08-11 DIAGNOSIS — R262 Difficulty in walking, not elsewhere classified: Secondary | ICD-10-CM | POA: Diagnosis present

## 2020-08-11 DIAGNOSIS — R296 Repeated falls: Secondary | ICD-10-CM | POA: Insufficient documentation

## 2020-08-11 NOTE — Therapy (Signed)
[place] MAIN [facility] SERVICES [address] [place], [place], [address] Phone: [phone]   Fax:  [phone]  Occupational Therapy Treatment  Patient Details  Name: Kenneth Summers MRN: [id number] Date of Birth: 1945-11-16 No data recorded  Encounter Date: 08/11/2020   OT End of Session - 08/11/20 1402     Visit Number [id number]    Number of Visits 254    Date for OT Re-Evaluation 08/16/20    Authorization Type Progress report period starting 05/31/2020    OT Start Time 1345    OT Stop Time 1430    OT Time Calculation (min) 45 min    Activity Tolerance Patient tolerated treatment well    Behavior During Therapy WFL for tasks assessed/performed             Past Medical History:  Diagnosis Date   Actinic keratosis    Alcohol abuse    APS (antiphospholipid syndrome) (HCC)    Basal cell carcinoma    BPH (benign prostatic hyperplasia)    CAD (coronary artery disease)    Cellulitis    Cervical spinal stenosis    Chronic osteomyelitis (HCC)    CKD (chronic kidney disease)    CKD (chronic kidney disease)    Clostridium difficile diarrhea    Depression    Diplopia    Fatigue    GERD (gastroesophageal reflux disease)    Hyperlipemia    Hypertension    Hypovitaminosis D    IBS (irritable bowel syndrome)    IgA deficiency (HCC)    Insomnia    Left lumbosacral radiculopathy    Moderate obstructive sleep apnea    Osteomyelitis of foot (HCC)    PAD (peripheral artery disease) (HCC)    Pruritus    RA (rheumatoid arthritis) (HCC)    Radiculopathy    Restless legs syndrome    RLS (restless legs syndrome)    SLE (systemic lupus erythematosus related syndrome) ([place])    Squamous cell carcinoma of skin 01/27/2019   right mid lower ear helix   Stroke ([place])    Toxic maculopathy of both eyes     Past Surgical History:  Procedure Laterality Date   CARDIAC CATHETERIZATION     CAROTID ENDARTERECTOMY     CERVICAL LAMINECTOMY      CORONARY ANGIOPLASTY     FRACTURE SURGERY     fusion C5-6-7     HERNIA REPAIR     KNEE ARTHROSCOPY     TONSILLECTOMY      There were no vitals filed for this visit.   Subjective Assessment - 08/11/20 1400     Subjective  Pt. reports that he forgot to take his medicine last night.    Patient is accompanied by: Family member    Pertinent History Pt. is a 75 y.o. 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 Pt. PMHx includes: Multiple Falls, Lupus, DJD, Renal Abscess, CVA. Pt. resides with his wife. Pt.'s wife and daughter assist with ADLs. Pt. has caregivers in  for 2 hours a day, 6 days a week. Pt. received Rehab services in acute care, at SNF for STR, and [place] services. Pt. is retired from [employer] for [employer] and [employer].    Currently in Pain? No/denies            OT TREATMENT     Therapeutic Exercise:   Pt. worked on [employer], and reciprocal motion using the UBE while seated for  8 min. with no resistance.  Pt. performed 2# dowel for UE strengthening secondary to weakness. Shoulder flexion, chest press, and circular patterns, were performed for 1 set 10 reps each with cues to keep the dowel symmetrical, and level. Pt. worked on 3# dumbbell ex. for bilateral elbow flexion and extension, and right supination/pronation,  and 2# for the left wrist extension. Pt. Performed red theraband exercises for right shoulder flexion, abduction, diagonal shoulder abduction. Pt. required assist stabilizing the theraband with his left hand. Pt. worked on reaching up with his LUE for cones.   Pt. reports that he is feeling much better than last. Pt. continues to make steady progress with BUE strength, and tolerance for there. ex. Pt. required fewer rest breaks throughout the session. Pt. required frequent verbal cues, and cues for visual demonstration for movement patterns, however required cues for visual demonstration. Pt. continues to require  consistent verbal, visual, and tactile cues. Pt. continues to require cues for radial deviation. Pt. was able to maintain upright sitting in midline with a towel roll. Pt. continues to require less cues to perform UE strengthening exercises, technique, and form as well as visual demonstration prior to the exercises. Pt. continues to work on improving BUE strength, and [facility] skills in order to improve LUE functioning during ADLs, IADL tasks.                                OT Education - 08/11/20 1401     Education Details strengthening, UE functioning    Person(s) Educated Patient;Caregiver(s)    Methods Explanation;Demonstration;Handout    Comprehension Returned demonstration;Tactile cues required;Verbalized understanding;Need further instruction                 OT Long Term Goals - 07/12/20 1408       OT LONG TERM GOAL #1   Title Pt. will increase left shoulder flexion AROM by 10 degrees to access cabinet/shelf.    Baseline 05/31/2020: Left shoulder flexion 119 degrees. Pt. is able to reach into cabinets with more ease, however has difficulty getting close enough to them with his w/c.Pt. continues to present with limited left shoulder ROM limitng access to cabinets, and shelves.03/01/2020: Left shoulder 116 degrees with new onset of numbness radiating from the left side of the neck to the hand. Pt. continues to progress with left shoulder ROM .Shouler flexion 130, and continues to work on reaching with increasing emphasis on flexing trunk to improve functional reach. 200th visit: shoulder flexion to 130 with effort    Time 12    Period Weeks    Status On-going    Target Date 08/16/20      OT LONG TERM GOAL #2   Title Pt. will donn a shirt with Supervision.    Baseline 05/31/2020: [place]. Pt. is assisting more with UE dressing at home. Pt. continues to require modA donning a zip down jacket with consistent cues to initiate the correct direction of the jacket.  Min-modA  mahaging the zipper. MinA for donning a T-shirt with assist to roll side to side to pull the shirt down over his back. 200th visit: continues to require min assist at times for shirt/jacket    Time 12    Period Weeks    Status On-going    Target Date 08/16/20      OT LONG TERM GOAL #3   Title Pt. will require ModA to perform LE dressing  Period Weeks      OT LONG TERM GOAL #4   Title Pt. will improve left grip strength by 10# to be able to open a jar/container.    Baseline Grip strength continues to be limited. Decreased left sided grip strength today secondary to a new onset of numbenss, and weakness radiating from the neck to the left hand. 200th visit: Pt. continues to have a harder time opening a wide mouthed jar, however is able to open a smaller jar easier.    Time 12    Period Weeks    Status On-going    Target Date 08/16/20      OT LONG TERM GOAL #5   Title Pt. will improve left hand [facility] skills to be able to assist with buttoning/zipping.    Baseline 05/24/2020: Pt. is improving with fastening small buttons. Pt. continues to work on improving [facility] skills  to improve buttoning , and zipping.03/01/2020: Pt.has had a new onset of weakness, and numbness in the left hand beginning upon waking up this morning which has resulted in a significant change in [facility] skills.  Pt. has improved overall donning a jacket, and  manipulating the zipper on his jacket, however continues to require practice depending on the type of zipper jacket. Pt. continues to work on improving bilateral [facility] skills for buttoning/zipping.  200th visit: Pt. has improved with buttons and zippers, however has difficulty manipulating buttons up higher on the shirt.    Time 12    Period Weeks    Status Achieved      OT LONG TERM GOAL #6   Title Pt. will demonstrate visual conmpensatory strategies for 100% of the time during ADLs    Baseline Improving, however Pt. continues to require intermittent cues cues for left sides  awareness.    Time 12    Period Weeks    Status On-going    Target Date 08/16/20      OT LONG TERM GOAL #7   Title Pt. will prepare a simple cold snack from w/c with supervision using cognitive compensatory strategies 100% of the time.    Baseline 200th visit: Pt. has difficulty with getting the w/c close enough to access the refrigerator, and counters    Time 12    Period Weeks    Status Deferred      OT LONG TERM GOAL #8   Title Pt. will accurately identify potential safety hazard using good safety awareness, and judgement 100% for ADLs, and IADLs.    Baseline 200th visit: Pt. continues to require cues    Time 12    Period Weeks    Status On-going    Target Date 08/16/20      OT LONG TERM GOAL  #9   TITLE Pt. will  navigate his w/c around obstacles with Supervision and 100% accuracy    Baseline Pt. has progressed, however, continues to require cuing for obstacles on the left with fewer  cues overall. Pt. requires significant cues to engage his left UE during tasks.. 200th visit: continues to bump into walls at home with the w/ct at times    Time 12    Period Weeks    Status On-going    Target Date 08/16/20      OT LONG TERM GOAL  #10   TITLE Pt. will independently be able  to reach to place items into cabinetry, and closets.    Baseline 200th visit: Pt. continues to have difficulty with functional reaching continues to  be limited.    Time 12    Period Weeks    Status On-going    Target Date 08/16/20      OT LONG TERM GOAL  #11   TITLE Pt. will increase BUE strength by 13mm grades in preparation for ADL transfers.    Baseline 05/31/2020: Pt. is progressing, however BUE strength continues to be limited. Pt. is able to tolerate increased resistive weights.    Time 12    Period Weeks    Status On-going    Target Date 08/16/20      OT LONG TERM GOAL  #12   TITLE Pt. will improve LUE functional reaching with the left engaging the trunk 100% of the time during ADL tasks with  minimal cues.    Baseline 200th visit: Pt. is limited with LUE functional reaching.    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 ([place]) 04/27/2016   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   Fatigue 02/06/2015   Visual changes 06/19/2013   Benign prostatic hyperplasia 01/07/2013   Depression 01/07/2013   Restless legs syndrome 01/07/2013   Chronic kidney disease, stage 3 unspecified ([place]) 12/20/2011   CAD (coronary artery disease) 11/03/2010   Hypertension 11/03/2010   PAD (peripheral artery disease) ([place]) 11/03/2010   SLE (systemic lupus erythematosus) ([place]) 11/03/2010    Harrel Carina, MS, OTR/L 08/11/2020, 2:47 PM  [place] MAIN [facility] SERVICES [place], [place], [address] Phone: [phone]   Fax:  [phone]  Name: Kenneth Summers MRN: [id number] Date of Birth: [birth year]

## 2020-08-11 NOTE — Therapy (Signed)
[place] MAIN [facility] SERVICES [address] [place], [place], [address] Phone: [phone]   Fax:  [phone]  Physical Therapy Treatment  Patient Details  Name: Kenneth Summers MRN: [id number] Date of Birth: 1945-07-29 No data recorded  Encounter Date: 08/11/2020   PT End of Session - 08/13/20 0753     Visit Number 256    Number of Visits 301    Date for PT Re-Evaluation 10/06/20    Authorization Type Medicare reporting period starting 09/11/18; NO FOTO    Authorization Time Period Recert 06/14/3265-02/07/4578; Recert 10/15/8336-2/50/5397    PT Start Time 1303    PT Stop Time 1343    PT Time Calculation (min) 40 min    Equipment Utilized During Treatment Gait belt    Activity Tolerance No increased pain;Patient limited by lethargy;Patient limited by fatigue    Behavior During Therapy [facility] for tasks assessed/performed             Past Medical History:  Diagnosis Date   Actinic keratosis    Alcohol abuse    APS (antiphospholipid syndrome) (HCC)    Basal cell carcinoma    BPH (benign prostatic hyperplasia)    CAD (coronary artery disease)    Cellulitis    Cervical spinal stenosis    Chronic osteomyelitis (HCC)    CKD (chronic kidney disease)    CKD (chronic kidney disease)    Clostridium difficile diarrhea    Depression    Diplopia    Fatigue    GERD (gastroesophageal reflux disease)    Hyperlipemia    Hypertension    Hypovitaminosis D    IBS (irritable bowel syndrome)    IgA deficiency (HCC)    Insomnia    Left lumbosacral radiculopathy    Moderate obstructive sleep apnea    Osteomyelitis of foot (HCC)    PAD (peripheral artery disease) (HCC)    Pruritus    RA (rheumatoid arthritis) (HCC)    Radiculopathy    Restless legs syndrome    RLS (restless legs syndrome)    SLE (systemic lupus erythematosus related syndrome) ([place])    Squamous cell carcinoma of skin 01/27/2019   right mid lower ear helix   Stroke ([place])     Toxic maculopathy of both eyes     Past Surgical History:  Procedure Laterality Date   CARDIAC CATHETERIZATION     CAROTID ENDARTERECTOMY     CERVICAL LAMINECTOMY     CORONARY ANGIOPLASTY     FRACTURE SURGERY     fusion C5-6-7     HERNIA REPAIR     KNEE ARTHROSCOPY     TONSILLECTOMY      There were no vitals filed for this visit.   Subjective Assessment - 08/13/20 0752     Subjective Patient reports  having an okay day- no report of pain. Caregiver, Kenneth Summers reports he has been more quiet past couple of weeks    Patient is accompained by: Family member    Pertinent History Patient is a 75 y.o. male who presents to outpatient physical therapy with a referral for medical diagnosis of CVA. This patient's chief complaints consist of left hemiplegia and overall deconditioning leading to the following functional deficits: dependent for ADLs, IADLs, unable to transfer to car, dependent transfers at home with hoyer lift, unable to walk or stand without significant assistance, difficulty with W/C navigation..    Limitations Sitting;Lifting;Standing;House hold activities;Writing;Walking;Other (comment)    Diagnostic tests Brain MRI 11/01/2017: IMPRESSION: 1. Acute right  ACA territory infarct as demonstrated on prior CT imaging. No intracranial hemorrhage or significant mass effect. 2. 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    Patient Stated Goals wants to be able to walk again    Currently in Pain? No/denies    Pain Onset Today    Pain Onset Today                    Interventions:  In //bars: Patient performed sit to stand requiring max assist of 2 people today. He was unable to extend left knee effectively throughout the 4 trials and unable to perform static standing longer than 30 sec for any trial. On last trial he was finally able to look up and stand briefly with left knee more extended and briefly able to extend hips but unable to maintain.    Sit to stand x 5 trials - Max Assist of 2 people today and reminder to use lean forward and extend left hand as he does present with some distractibility and left sided neglect.   Patient able to perform active right LE hip flex and knee ext today x 10 reps each but only able to minimal raise his left knee or extend his left knee during exercises. He required more assist to go through these 2 ROM exercises than previous visits.  Education provided throughout session via VC/TC and demonstration to facilitate movement at target joints and correct muscle activation for all exercises performed.   Clinical Impression: Patient presents with increasing weakness over past few weeks. He was able to attempt standing today yet required max Assist of 2 people. He is not talking as much during sessions and presents with increased overall left LE weakness. Will continue to focus on strengthening as appropriate. will continue to benefit from skilled PT services to improve his functional strength and postural control for improved transfers/standing                      PT Short Term Goals - 07/14/20 0175       PT SHORT TERM GOAL #1   Title Be independent with initial home exercise program for self-management of symptoms.    Baseline HEP to be given at second session; advice to practice unsupported sitting at home (06/19/2018); 07/08/18: Performing at home, 6/29: needs assistance but is doing exercise program regularly; 08/22/18: adherent;    Time 2    Period Weeks    Status Achieved    Target Date 06/26/18      PT SHORT TERM GOAL #2   Title Patient will perform sit to stand transfer with mod Assist in parallel bars for improved transfer ability with decreased caregiver assist.    Baseline 04/19/2020- Max assist of 1 person. 07/14/2020- Patient continues to require Max assist for sit to stand transfers    Time 6    Period Weeks    Status On-going    Target Date 08/25/20                PT Long Term Goals - 07/21/20 0723       PT LONG TERM GOAL #1   Title Patient will complete all bed mobility with min A to improve functional independence for getting in and out of bed and adjusting in bed.     Baseline max A - total A reported by family (06/19/2018); 07/08/18: still requires maxA, dtr reports improvement in rolling since starting therapy; 6/23: min  A to supervision in clinic; not doing at home right; 8/5: Pt's daughter states that his rolling has improved, but still has difficulty with all other bed mobility; 10/23/18: pts daughter states that she is doing about 35% of the work if he is in proper position, he does uses the bed rails to help roll;  using Harrel Lemon for coming to sit EOB; 10/12: pts daughter states that she is doing about 30% of the work if he is in proper position, he does uses the bed rails to help roll, but it is improving;  using Harrel Lemon for coming to sit EOB, 10/29: min A for sit to sidelying, rolling supervision, able to transition sidelying to sit with HHA; 12/21: min-mod A. 03/01/2020- Patient presents improving bed mobility with current ability to roll to left with CGA and verbal and tactile cues and min assist to roll to right as seen by recent visits. 03/31/2020- Patient and caregiver report that he has maintained ability to roll side to side with only minimal assistance.    Time 12    Period Weeks    Status Achieved      PT LONG TERM GOAL #2   Title Patient will complete sit <> stand transfer chair to chair with Min A and LRAD to improve functional independence for household and community mobility.     Baseline min A for STS, still requires elevated surface height to 27".  03/01/20- Patient currently unable to perform a chair to chair transfer and continues to be transferred using hoyer lift. Patient has been training with sit to stand at parallel bars and able to currently perform sit to stand with max assist  from 27in. high surface.03/31/2020- Patient remains at [facility]  assist with sit to stand transfer and continued use of hoyer lift transfer for all chair to chair or bed to chair transfers. 04/19/2020- Patient requires Max assist with sit to stand transfers and requires hoyer for chair to chair transfers. 07/14/2020- Patient has continued to require max assist since Feb for sit to stand transfers- Do not anticipate that this is a realistic goal at this time.    Time 12    Period Weeks    Status Not Met    Target Date --   Goal no longer appropriate.     PT LONG TERM GOAL #3   Title Patient will navigate power w/c with min A x 100 feet to improve mobility for household and short community distances.    Baseline required total A (06/12/2018); able to roll 20 feet with min A (06/20/2018); 07/08/18: Pt can go approximately 10' without assist per family;  10/23/2018: pt's daughter states that his power WC mobility has become 100% better with the new hand control, slowed speed, and with practice; 10/12: pt's daughter reports that he can perform household WC mobility and limited community ambulation with minA    Time 12    Period Weeks    Status Achieved      PT LONG TERM GOAL #4   Title Patient will complete car transfers W/C to family SUV with min A using LRAD to improve his abilty to participate in community activities.     Baseline unable to complete car transfers at all (06/12/2018); unable to complete car transfers at all (06/19/2018).; 07/08/18: Unable to perform at this time; 09/11/18: unable to perform at this time; 10/23/2018: unable to complete car transfers at all; 10/12:  unable to complete car transfers at all, 10/29: unable; 01/13/19: unable; 2/11: min-mod  A +2, 3/10: min A+1, 12/21: mod A. 03/01/2020- Patient unable to complete car transfers and continues to require medical transportation at this time. 3/14- Patient reports he may be purchasing a conversion van later this week.07/14/2020- Patient did not purchase the conversion Lucianne Lei and continues to require medical transport  for all outings using his wheelchair.    Time 12    Period Weeks    Status Deferred    Target Date 10/06/20      PT LONG TERM GOAL #5   Title Patient will ambulate at least 10 feet with min A using LRAD to improve mobility for household and community distances.    Baseline unable to take steps (06/12/2018); able to shift R leg forward and back with max A and RW at edge of plinth (06/19/2018); 07/08/18: unable to ambulate at this time. 09/11/18: unable to perform at this time; 10/23/2018: unable to perform at this time; 10/12: unable to perform at this time, 10/29: unable at this time; 01/13/19: unable at this time, 2/11: min A to step with RLE, unable to step with LLE, 3/10: able to take 2 steps in parallel bars with min A for safety; 4/1: min A for walking 10 feet in parallel bars, 5/24: min A walking 10 feet in parallel bars; 6/21: no change 7/14: no change. 03/01/2020- Patient unable to ambulate and currently focusing on static standing. He was able to take a forward step with his right LE last week but unable today. 03/31/2020- Patient continues to be non-ambulatory but has been able to move right LE forward with standing but unable to take a step with left LE. 04/19/2020- Goal is still not appropriate as of yet- cont to focus on static and dynamic standing balance/pregait activities. 07/14/2020- Goal still not appropriate- working on ability to dynamically weight shift for transfers and continuing with static transfers.    Time 12    Period Weeks    Status Deferred    Target Date 10/06/20      Additional Long Term Goals   Additional Long Term Goals Yes      PT LONG TERM GOAL #6   Title Patient will be able to safety navigate up/down ramp with power chair, exhibiting good safety awareness, mod I to safely enter/exit his home.    Baseline 2/11: supervision with intermittent min A;4/1: supervision; 5/24: supervision. 03/01/2020- patient reports that he is able to navigate down the ramp at home to leave the  home but continues to need assist to navigate up the ramp into the home. 07/14/2020- Patient reports able to negotiate w/c in/out of home.    Time 12    Period Weeks    Status Achieved      PT LONG TERM GOAL #7   Title Patient will increase functional reach test to >15 inches in sitting to exhibit improved sitting balance and positioning and reduce fall risk;    Baseline 07/30/18: 12 inches; 09/11/18: 10-12 inches; 10/23/18: 12-13 inch; 10/12: 14.5", 10/29: 15.5 inch    Time 4    Period Weeks    Status Achieved      PT LONG TERM GOAL #8   Title Patient will increase LLE quad strength to 3+/5 to improve functional strength for standing and mobility;    Baseline 10/28: 2+/5; 01/13/19: 2+/5, 2/11: 3-/5, 04/16/19: 3-/5, 05/08/19: 3-/5, 5/24: 3-/5, 6/21: 3-/5, 7/14: 3-/5, 12/21: 3-/5. 03/01/2020- Left quad= 3-/5 (focusing on quad strengthening during visits and with home program). 03/31/2020- left quad = 3-/5  with MMT. 04/19/2020= 3- /5 with left quad strength with manual muscle testing. 07/14/2020- Patient presents with 3-/5 and quickly fatigues with knee ext yet functionall able to stand of left LE without knee buckling- yet unable to terminally extend left knee with static standing.    Time 12    Period Weeks    Status On-going    Target Date 10/06/20      PT LONG TERM GOAL  #9   TITLE Patient will report no vertigo with provoking motions or positions in order to be able to perform ADLs and mobility tasks without symptoms.    Baseline 06/30/19: reports no dizziness in last week; 6/21: one episode this weekend; 7/14: experiencing one a week; 03/01/2020.- Patient has denied any dizziness for over past 3 weeks.    Time 12    Period Weeks    Status Achieved      PT LONG TERM GOAL  #10   TITLE Patient will demonstrate improved functional LE/Postural strength as seen by ability to perform static standing for 2 min or greater with max assist  to assist with strength required for ADL's and transfers/pregait/gait  activities.    Baseline 03/01/2020- Patient able to stand approx 40 sec today with max assist in parallel bars. 03/31/2020- Patient able to demo static stand with max A for 43min 47 sec today. 04/19/2020- Patient able to demo 2 min today with mod/max assist - yet remains inconsistent. 07/14/2020- Did not attempt today but during past visits during this cert- patient has been inconsistent standing for 30 sec to 2 min. Will keep goal active to strive for more consistency to help with strength for transfers.    Time 12    Period Weeks    Status On-going    Target Date 10/06/20      PT LONG TERM GOAL  #11   TITLE Patient will demonstrate improved static sitting- able to sit unsupported at back for 5 min without evidence of listing to left for improved sitting and postural control to prevent excessive pressure through left hip/arm with sitting.    Baseline 07/14/2020- Patient presents with progressive listing to left side over past month increasing risk for skin breakdown, pain in low back, and long term postural abnormalites.    Time 12    Period Weeks    Status New    Target Date 10/06/20                   Plan - 08/13/20 0803     Clinical Impression Statement Patient presents with increasing weakness over past few weeks. He was able to attempt standing today yet required max Assist of 2 people. He is not talking as much during sessions and presents with increased overall left LE weakness. Will continue to focus on strengthening as appropriate. will continue to benefit from skilled PT services to improve his functional strength and postural control for improved transfers/standing    Personal Factors and Comorbidities Age;Comorbidity 3+    Comorbidities Relevant past medical history and comorbidities include long term steroid use for lupus, CKD, chronic osteomyelitis, cervical spine stenosis, BPH, APS, alcohol abuse, peripheral artery disease, depression, GERD, obstructive sleep apnea, HTN, IBS,  IgA deficiency, lumbar radiculopathy, RA, Stroke, toxic maculopathy in both eyes, systemic lupus erythematosus related syndrome, cardiac catheterization, carotid endarterectomy, cervical laminectomy, coronary angioplasty, Fusion C5-C7, knee arthroplasty, lumbar surgery.    Examination-Activity Limitations Bed Mobility;Bend;Sit;Toileting;Stand;Stairs;Lift;Transfers;Squat;Locomotion Level;Carry;Dressing;Hygiene/Grooming;Continence    Examination-Participation Restrictions Yard Work;Interpersonal Relationship;Community Activity  Stability/Clinical Decision Making Evolving/Moderate complexity    Rehab Potential Fair    PT Frequency 2x / week    PT Duration 12 weeks    PT Treatment/Interventions ADLs/Self Care Home Management;Electrical Stimulation;Moist Heat;Cryotherapy;Gait training;Stair training;Functional mobility training;Therapeutic exercise;Balance training;Neuromuscular re-education;Cognitive remediation;Patient/family education;Orthotic Fit/Training;Wheelchair mobility training;Manual techniques;Passive range of motion;Energy conservation;Joint Manipulations;Canalith Repostioning;Therapeutic activities;Vestibular    PT Next Visit Plan Continue with progressive postural/LE strengthening/pregait activities    PT Home Exercise Plan no changes    Consulted and Agree with Plan of Care Patient;Family member/caregiver              Patient will benefit from skilled therapeutic intervention in order to improve the following deficits and impairments:  Abnormal gait, Decreased activity tolerance, Decreased cognition, Decreased endurance, Decreased knowledge of use of DME, Decreased range of motion, Decreased skin integrity, Decreased strength, Impaired perceived functional ability, Impaired sensation, Impaired UE functional use, Improper body mechanics, Pain, Cardiopulmonary status limiting activity, Decreased balance, Decreased coordination, Decreased mobility, Difficulty walking, Impaired tone,  Postural dysfunction, Dizziness  Visit Diagnosis: Abnormality of gait and mobility  Difficulty in walking, not elsewhere classified  Muscle weakness (generalized)  Other abnormalities of gait and mobility     Problem List Patient Active Problem List   Diagnosis Date Noted   Sepsis ([place]) 10/18/2018   Long term (current) use of anticoagulants 05/01/2018   History of stroke 04/11/2018   Hyperlipidemia 07/04/2017   Chronic diarrhea 06/12/2017   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 ([place]) 04/27/2016   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   Fatigue 02/06/2015   Visual changes 06/19/2013   Benign prostatic hyperplasia 01/07/2013   Depression 01/07/2013   Restless legs syndrome 01/07/2013   Chronic kidney disease, stage 3 unspecified ([place]) 12/20/2011   CAD (coronary artery disease) 11/03/2010   Hypertension 11/03/2010   PAD (peripheral artery disease) ([place]) 11/03/2010   SLE (systemic lupus erythematosus) ([place]) 11/03/2010    Lewis Moccasin, PT 08/13/2020, 8:04 AM  [place] MAIN [facility] SERVICES [address] [place], [place], [address] Phone: [phone]   Fax:  [phone]  Name: Kenneth Summers MRN: [id number] Date of Birth: 10/01/1945

## 2020-08-16 ENCOUNTER — Other Ambulatory Visit: Payer: Self-pay

## 2020-08-16 ENCOUNTER — Ambulatory Visit: Payer: Medicare Other | Admitting: Physical Therapy

## 2020-08-16 ENCOUNTER — Ambulatory Visit: Payer: Medicare Other | Admitting: Occupational Therapy

## 2020-08-16 ENCOUNTER — Encounter: Payer: Self-pay | Admitting: Occupational Therapy

## 2020-08-16 DIAGNOSIS — M6281 Muscle weakness (generalized): Secondary | ICD-10-CM

## 2020-08-16 DIAGNOSIS — I69354 Hemiplegia and hemiparesis following cerebral infarction affecting left non-dominant side: Secondary | ICD-10-CM

## 2020-08-16 DIAGNOSIS — R278 Other lack of coordination: Secondary | ICD-10-CM

## 2020-08-16 DIAGNOSIS — R269 Unspecified abnormalities of gait and mobility: Secondary | ICD-10-CM | POA: Diagnosis not present

## 2020-08-16 DIAGNOSIS — R262 Difficulty in walking, not elsewhere classified: Secondary | ICD-10-CM

## 2020-08-16 NOTE — Therapy (Signed)
[place] MAIN [facility] SERVICES [address] [place], [place], [address] Phone: [phone]   Fax:  [phone]  Occupational Therapy Treatment/Recertification Note  Patient Details  Name: Kenneth Summers MRN: [id number] Date of Birth: 12-16-1945 No data recorded  Encounter Date: 08/16/2020   OT End of Session - 08/16/20 1728     Visit Number [id number]    Number of Visits 254    Date for OT Re-Evaluation 11/08/20    Authorization Type Progress report period starting 6/062022    OT Start Time 1345    OT Stop Time 1430    OT Time Calculation (min) 45 min    Activity Tolerance Patient tolerated treatment well    Behavior During Therapy WFL for tasks assessed/performed             Past Medical History:  Diagnosis Date   Actinic keratosis    Alcohol abuse    APS (antiphospholipid syndrome) (HCC)    Basal cell carcinoma    BPH (benign prostatic hyperplasia)    CAD (coronary artery disease)    Cellulitis    Cervical spinal stenosis    Chronic osteomyelitis (HCC)    CKD (chronic kidney disease)    CKD (chronic kidney disease)    Clostridium difficile diarrhea    Depression    Diplopia    Fatigue    GERD (gastroesophageal reflux disease)    Hyperlipemia    Hypertension    Hypovitaminosis D    IBS (irritable bowel syndrome)    IgA deficiency (HCC)    Insomnia    Left lumbosacral radiculopathy    Moderate obstructive sleep apnea    Osteomyelitis of foot (HCC)    PAD (peripheral artery disease) (HCC)    Pruritus    RA (rheumatoid arthritis) (HCC)    Radiculopathy    Restless legs syndrome    RLS (restless legs syndrome)    SLE (systemic lupus erythematosus related syndrome) ([place])    Squamous cell carcinoma of skin 01/27/2019   right mid lower ear helix   Stroke ([place])    Toxic maculopathy of both eyes     Past Surgical History:  Procedure Laterality Date   CARDIAC CATHETERIZATION     CAROTID ENDARTERECTOMY     CERVICAL  LAMINECTOMY     CORONARY ANGIOPLASTY     FRACTURE SURGERY     fusion C5-6-7     HERNIA REPAIR     KNEE ARTHROSCOPY     TONSILLECTOMY      There were no vitals filed for this visit.   Subjective Assessment - 08/16/20 1728     Subjective  Pt. reports  that he is tired today.   Patient is accompanied by: Family member    Pertinent History Pt. is a 75 y.o. 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 Pt. PMHx includes: Multiple Falls, Lupus, DJD, Renal Abscess, CVA. Pt. resides with his wife. Pt.'s wife and daughter assist with ADLs. Pt. has caregivers in  for 2 hours a day, 6 days a week. Pt. received Rehab services in acute care, at SNF for STR, and [facility]. Pt. is retired from [employer] for [employer] and [employer].    Currently in Pain? No/denies                [facility] OT Assessment - 08/16/20 0001       Coordination   Left 9 Hole Peg Test >5 min.  Hand Function   Left Hand Grip (lbs) 8    Left Hand Lateral Pinch 4 lbs    Left 3 point pinch 5 lbs            Measurements were obtained. Pt. has had a decrease in LUE grip strength, pinch strength, and [place] skills over this past recertification period. Pt. Has had intermittent episodes of fatique, and one episode in which he forgot to take his medicine at home the prior evening resulting in increased fatigue limiting participation. Pt. had an episode of Nausea and vomiting during this recertification period limiting functional performance. Pt. Presented with several episodes of fatigue during therapy sessions resulting in increased cues, and and rest breaks. Pt. continues to need OT services to work on improving LUE strength, [facility] skills, and LUE functioning during ADLs, and IADL tasks.                  OT Education - 08/16/20 1728     Education Details strengthening, UE functioning    Person(s) Educated Patient;Caregiver(s)    Methods  Explanation;Demonstration;Handout    Comprehension Returned demonstration;Tactile cues required;Verbalized understanding;Need further instruction                 OT Long Term Goals - 08/16/20 1730       OT LONG TERM GOAL #1   Title Pt. will increase left shoulder flexion AROM by 10 degrees to access cabinet/shelf.    Baseline Left shoulder flexion 119 degrees. Pt. is able to reach into cabinets with more ease, however has difficulty getting close enough to them with his w/c.Pt. continues to present with limited left shoulder ROM limitng access to cabinets, and shelves.03/01/2020: Left shoulder 116 degrees with new onset of numbness radiating from the left side of the neck to the hand. Pt. continues to progress with left shoulder ROM .Shouler flexion 130, and continues to work on reaching with increasing emphasis on flexing trunk to improve functional reach. 200th visit: shoulder flexion to 130 with effort    Time 12    Period Weeks    Status On-going    Target Date 11/08/20      OT LONG TERM GOAL #2   Title Pt. will donn a shirt with Supervision.    Baseline ModA. Pt. is assisting more with UE dressing at home. Pt. continues to require modA donning a zip down jacket with consistent cues to initiate the correct direction of the jacket.  Min-modA mahaging the zipper. MinA for donning a T-shirt with assist to roll side to side to pull the shirt down over his back. 200th visit: continues to require min assist at times for shirt/jacket    Time 12    Period Weeks    Status On-going    Target Date 11/08/20      OT LONG TERM GOAL #3   Baseline MaxA left, ModA with the right. Pt. has sores on his ankle, and feet. Pt.'s daughter is assisting.      OT LONG TERM GOAL #4   Title Pt. will improve left grip strength by 10# to be able to open a jar/container.    Baseline Grip strength continues to be limited. Decreased left sided grip strength today secondary to a new onset of numbenss, and  weakness radiating from the neck to the left hand. 200th visit: Pt. continues to have a harder time opening a wide mouthed jar, however is able to open a smaller jar easier.    Time  12    Period Weeks    Status On-going    Target Date 11/08/20      OT LONG TERM GOAL #5   Title Pt. will improve left hand [facility] skills to be able to assist with buttoning/zipping.      OT LONG TERM GOAL #6   Title Pt. will demonstrate visual conmpensatory strategies for 100% of the time during ADLs    Baseline Improving, however Pt. continues to require intermittent cues for left sides awareness.    Time 12    Period Weeks    Status On-going    Target Date 08/16/20      OT LONG TERM GOAL #8   Title Pt. will accurately identify potential safety hazard using good safety awareness, and judgement 100% for ADLs, and IADLs.    Baseline Pt. continues to require cues    Time 12    Period Weeks    Status On-going    Target Date 11/08/20      OT LONG TERM GOAL  #9   TITLE Pt. will  navigate his w/c around obstacles with Supervision and 100% accuracy    Baseline Pt. has progressed, however, continues to require cuing for obstacles on the left with fewer  cues overall. Pt. requires significant cues to engage his left UE during tasks.. 200th visit: continues to bump into walls at home with the w/ct at times    Time 12    Period Weeks    Status On-going    Target Date 11/08/20      OT LONG TERM GOAL  #10   TITLE Pt. will independently be able  to reach to place items into cabinetry, and closets.    Baseline Pt. continues to have difficulty with functional reaching continues to be limited.    Time 12    Period Weeks    Status On-going    Target Date 11/08/20      OT LONG TERM GOAL  #11   TITLE Pt. will increase BUE strength by 36mm grades in preparation for ADL transfers.    Baseline Pt. is progressing, however BUE strength continues to be limited. Pt. is able to tolerate increased resistive weights.    Time 12     Period Weeks    Status On-going    Target Date 11/08/20      OT LONG TERM GOAL  #12   TITLE Pt. will improve LUE functional reaching with the left engaging the trunk 100% of the time during ADL tasks with minimal cues.    Baseline Pt. is limited with LUE functional reaching.    Time 12    Period Weeks    Status On-going    Target Date 11/08/20                   Plan - 08/16/20 1729     Clinical Impression Statement Measurements were obtained. Pt. has had a decrease in LUE grip strength, pinch strength, and [place] skills over this past recertification period. Pt. Has had intermittent episodes of fatigue which have limited his functional status. Pt. Presented with one episode in which he forgot to take his medicine at home the prior evening resulting in increased fatigue limiting participation. Pt. had an episode of nausea and vomiting during this recertification period limiting functional performance. When pt. Is feeling well, he is able to fully, and actively engage in each session, and work towards progressing. Pt. Presented with several episodes of fatigue during therapy  sessions resulting in increased cues, and and rest breaks. Pt. continues to need OT services to work on improving LUE strength, [facility] skills, and LUE functioning during ADLs, and IADL tasks.     OT Occupational Profile and History Problem Focused Assessment - Including review of records relating to presenting problem    Occupational performance deficits (Please refer to evaluation for details): ADL's    Body Structure / Function / Physical Skills UE functional use;Coordination;FMC;Dexterity;Strength;ROM    Cognitive Skills Attention;Memory;Emotional;Problem Solve;Safety Awareness    Rehab Potential Good    Clinical Decision Making Several treatment options, min-mod task modification necessary    Comorbidities Affecting Occupational Performance: Presence of comorbidities impacting occupational performance     Comorbidities impacting occupational performance description: Phyical, cognitive, visual,  medical comorbidities    Modification or Assistance to Complete Evaluation  No modification of tasks or assist necessary to complete eval    OT Frequency 2x / week    OT Duration 12 weeks    OT Treatment/Interventions Self-care/ADL training;DME and/or AE instruction;Therapeutic exercise;Therapeutic activities;Moist Heat;Cognitive remediation/compensation;Neuromuscular education;Visual/perceptual remediation/compensation;Coping strategies training;Patient/family education;Passive range of motion;Psychosocial skills training;Energy conservation;Functional Mobility Training    Consulted and Agree with Plan of Care Patient;Family member/caregiver             Patient will benefit from skilled therapeutic intervention in order to improve the following deficits and impairments:   Body Structure / Function / Physical Skills: UE functional use, Coordination, FMC, Dexterity, Strength, ROM Cognitive Skills: Attention, Memory, Emotional, Problem Solve, Safety Awareness     Visit Diagnosis: Muscle weakness (generalized)  Other lack of coordination    Problem List Patient Active Problem List   Diagnosis Date Noted   Sepsis ([place]) 10/18/2018   Long term (current) use of anticoagulants 05/01/2018   History of stroke 04/11/2018   Hyperlipidemia 07/04/2017   Chronic diarrhea 06/12/2017   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 ([place]) 04/27/2016   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   Fatigue 02/06/2015   Visual changes 06/19/2013   Benign prostatic hyperplasia 01/07/2013   Depression 01/07/2013   Restless legs syndrome 01/07/2013   Chronic kidney disease, stage 3 unspecified ([place]) 12/20/2011   CAD (coronary artery disease) 11/03/2010   Hypertension 11/03/2010   PAD (peripheral artery disease) ([place]) 11/03/2010   SLE (systemic lupus erythematosus) ([place]) 11/03/2010     Harrel Carina, MS, OTR/L 08/16/2020, 5:55 PM  [place] MAIN [facility] SERVICES [place], [place], [address] Phone: [phone]   Fax:  [phone]  Name: Kenneth Summers MRN: [id number] Date of Birth: 17-Jun-1945

## 2020-08-16 NOTE — Therapy (Addendum)
[place] MAIN [facility] SERVICES [address] [place], [place], [address] Phone: [phone]   Fax:  [phone]  Physical Therapy Treatment  Patient Details  Name: Kenneth Summers MRN: [id number] Date of Birth: [birth year] No data recorded  Encounter Date: 08/16/2020   PT End of Session - 08/16/20 1414     Visit Number [id number]    Number of Visits 301    Date for PT Re-Evaluation 10/06/20    Authorization Type Medicare reporting period starting 09/11/18; NO FOTO    Authorization Time Period Recert 2/44/0102-08/07/5364; Recert 05/10/345-05/31/9561    PT Start Time 1300    PT Stop Time 1345    PT Time Calculation (min) 45 min    Equipment Utilized During Treatment Gait belt    Activity Tolerance No increased pain;Patient limited by lethargy;Patient limited by fatigue    Behavior During Therapy [facility] for tasks assessed/performed             Past Medical History:  Diagnosis Date   Actinic keratosis    Alcohol abuse    APS (antiphospholipid syndrome) (HCC)    Basal cell carcinoma    BPH (benign prostatic hyperplasia)    CAD (coronary artery disease)    Cellulitis    Cervical spinal stenosis    Chronic osteomyelitis (HCC)    CKD (chronic kidney disease)    CKD (chronic kidney disease)    Clostridium difficile diarrhea    Depression    Diplopia    Fatigue    GERD (gastroesophageal reflux disease)    Hyperlipemia    Hypertension    Hypovitaminosis D    IBS (irritable bowel syndrome)    IgA deficiency (HCC)    Insomnia    Left lumbosacral radiculopathy    Moderate obstructive sleep apnea    Osteomyelitis of foot (HCC)    PAD (peripheral artery disease) (HCC)    Pruritus    RA (rheumatoid arthritis) (HCC)    Radiculopathy    Restless legs syndrome    RLS (restless legs syndrome)    SLE (systemic lupus erythematosus related syndrome) ([place])    Squamous cell carcinoma of skin 01/27/2019   right mid lower ear helix   Stroke ([place])     Toxic maculopathy of both eyes     Past Surgical History:  Procedure Laterality Date   CARDIAC CATHETERIZATION     CAROTID ENDARTERECTOMY     CERVICAL LAMINECTOMY     CORONARY ANGIOPLASTY     FRACTURE SURGERY     fusion C5-6-7     HERNIA REPAIR     KNEE ARTHROSCOPY     TONSILLECTOMY      There were no vitals filed for this visit.   INTERVENTIONS   Therapeutic Activity In //bars: Patient performed sit to stand requiring max assist of 2 people today. He was unable to extend left knee effectively throughout the 4 trials. Standing time ranged from 5-20 seconds.VC to lean forward, push through RLE and RUE, correct leftward lean, place hands and feet in most energy efficient positions to perform STS.       Therapeutic Exercise  Seated LAQ, 2 x 10 each side, assist with LLE; Seated hip flexion x 10 RLE, assist with OP for stretch to LLE; Seated adduction x 10 reps, performed bilaterally; Seated glute sets x 10 reps;  Attempted seated HS curls and hip abduction with no palpable muscle activation. Pt did demo both exercises on right side to demo he understood the movement/directions.  Seated trunk flexion and extension with VC to "crunch" and look forward and to retract scapulae to extend trunk back to the seat rest.   Manual Seated HS stretch 3 x 1 minute with light OP to knee; Seated hip abduction PROM x 10 reps; Seated hip flexion x 10 LLE, assist with OP for stretch;   Education provided throughout session via VC/TC and demonstration to facilitate movement at target joints and correct muscle activation for all exercises performed.      Clinical Impression: Pt presented to PT with excellent motivation today. His daughter was present throughout session. He stood x4 reps with MAX Ax 2. Exercise was ended due to pt report of R shoulder pain. Pt reported all LLE stretches and PROM felt good. Assist was provided with all LLE therex. Will continue to focus on strengthening as  appropriate. will continue to benefit from skilled PT services to improve his functional strength and postural control for improved transfers/standing        PT Short Term Goals - 07/14/20 8295       PT SHORT TERM GOAL #1   Title Be independent with initial home exercise program for self-management of symptoms.    Baseline HEP to be given at second session; advice to practice unsupported sitting at home (06/19/2018); 07/08/18: Performing at home, 6/29: needs assistance but is doing exercise program regularly; 08/22/18: adherent;    Time 2    Period Weeks    Status Achieved    Target Date 06/26/18      PT SHORT TERM GOAL #2   Title Patient will perform sit to stand transfer with mod Assist in parallel bars for improved transfer ability with decreased caregiver assist.    Baseline 04/19/2020- Max assist of 1 person. 07/14/2020- Patient continues to require Max assist for sit to stand transfers    Time 6    Period Weeks    Status On-going    Target Date 08/25/20               PT Long Term Goals - 07/21/20 0723       PT LONG TERM GOAL #1   Title Patient will complete all bed mobility with min A to improve functional independence for getting in and out of bed and adjusting in bed.     Baseline max A - total A reported by family (06/19/2018); 07/08/18: still requires maxA, dtr reports improvement in rolling since starting therapy; 6/23: min A to supervision in clinic; not doing at home right; 8/5: Pt's daughter states that his rolling has improved, but still has difficulty with all other bed mobility; 10/23/18: pts daughter states that she is doing about 35% of the work if he is in proper position, he does uses the bed rails to help roll;  using Harrel Lemon for coming to sit EOB; 10/12: pts daughter states that she is doing about 30% of the work if he is in proper position, he does uses the bed rails to help roll, but it is improving;  using Harrel Lemon for coming to sit EOB, 10/29: min A for sit to  sidelying, rolling supervision, able to transition sidelying to sit with HHA; 12/21: min-mod A. 03/01/2020- Patient presents improving bed mobility with current ability to roll to left with CGA and verbal and tactile cues and min assist to roll to right as seen by recent visits. 03/31/2020- Patient and caregiver report that he has maintained ability to roll side to side with only minimal assistance.  Time 12    Period Weeks    Status Achieved      PT LONG TERM GOAL #2   Title Patient will complete sit <> stand transfer chair to chair with Min A and LRAD to improve functional independence for household and community mobility.     Baseline min A for STS, still requires elevated surface height to 27".  03/01/20- Patient currently unable to perform a chair to chair transfer and continues to be transferred using hoyer lift. Patient has been training with sit to stand at parallel bars and able to currently perform sit to stand with max assist  from 27in. high surface.03/31/2020- Patient remains at [facility] assist with sit to stand transfer and continued use of hoyer lift transfer for all chair to chair or bed to chair transfers. 04/19/2020- Patient requires Max assist with sit to stand transfers and requires hoyer for chair to chair transfers. 07/14/2020- Patient has continued to require max assist since Feb for sit to stand transfers- Do not anticipate that this is a realistic goal at this time.    Time 12    Period Weeks    Status Not Met    Target Date --   Goal no longer appropriate.     PT LONG TERM GOAL #3   Title Patient will navigate power w/c with min A x 100 feet to improve mobility for household and short community distances.    Baseline required total A (06/12/2018); able to roll 20 feet with min A (06/20/2018); 07/08/18: Pt can go approximately 10' without assist per family;  10/23/2018: pt's daughter states that his power WC mobility has become 100% better with the new hand control, slowed speed, and with  practice; 10/12: pt's daughter reports that he can perform household WC mobility and limited community ambulation with minA    Time 12    Period Weeks    Status Achieved      PT LONG TERM GOAL #4   Title Patient will complete car transfers W/C to family SUV with min A using LRAD to improve his abilty to participate in community activities.     Baseline unable to complete car transfers at all (06/12/2018); unable to complete car transfers at all (06/19/2018).; 07/08/18: Unable to perform at this time; 09/11/18: unable to perform at this time; 10/23/2018: unable to complete car transfers at all; 10/12:  unable to complete car transfers at all, 10/29: unable; 01/13/19: unable; 2/11: min-mod A +2, 3/10: min A+1, 12/21: mod A. 03/01/2020- Patient unable to complete car transfers and continues to require medical transportation at this time. 3/14- Patient reports he may be purchasing a conversion van later this week.07/14/2020- Patient did not purchase the conversion Lucianne Lei and continues to require medical transport for all outings using his wheelchair.    Time 12    Period Weeks    Status Deferred    Target Date 10/06/20      PT LONG TERM GOAL #5   Title Patient will ambulate at least 10 feet with min A using LRAD to improve mobility for household and community distances.    Baseline unable to take steps (06/12/2018); able to shift R leg forward and back with max A and RW at edge of plinth (06/19/2018); 07/08/18: unable to ambulate at this time. 09/11/18: unable to perform at this time; 10/23/2018: unable to perform at this time; 10/12: unable to perform at this time, 10/29: unable at this time; 01/13/19: unable at this time, 2/11: min A  to step with RLE, unable to step with LLE, 3/10: able to take 2 steps in parallel bars with min A for safety; 4/1: min A for walking 10 feet in parallel bars, 5/24: min A walking 10 feet in parallel bars; 6/21: no change 7/14: no change. 03/01/2020- Patient unable to ambulate and currently  focusing on static standing. He was able to take a forward step with his right LE last week but unable today. 03/31/2020- Patient continues to be non-ambulatory but has been able to move right LE forward with standing but unable to take a step with left LE. 04/19/2020- Goal is still not appropriate as of yet- cont to focus on static and dynamic standing balance/pregait activities. 07/14/2020- Goal still not appropriate- working on ability to dynamically weight shift for transfers and continuing with static transfers.    Time 12    Period Weeks    Status Deferred    Target Date 10/06/20      Additional Long Term Goals   Additional Long Term Goals Yes      PT LONG TERM GOAL #6   Title Patient will be able to safety navigate up/down ramp with power chair, exhibiting good safety awareness, mod I to safely enter/exit his home.    Baseline 2/11: supervision with intermittent min A;4/1: supervision; 5/24: supervision. 03/01/2020- patient reports that he is able to navigate down the ramp at home to leave the home but continues to need assist to navigate up the ramp into the home. 07/14/2020- Patient reports able to negotiate w/c in/out of home.    Time 12    Period Weeks    Status Achieved      PT LONG TERM GOAL #7   Title Patient will increase functional reach test to >15 inches in sitting to exhibit improved sitting balance and positioning and reduce fall risk;    Baseline 07/30/18: 12 inches; 09/11/18: 10-12 inches; 10/23/18: 12-13 inch; 10/12: 14.5", 10/29: 15.5 inch    Time 4    Period Weeks    Status Achieved      PT LONG TERM GOAL #8   Title Patient will increase LLE quad strength to 3+/5 to improve functional strength for standing and mobility;    Baseline 10/28: 2+/5; 01/13/19: 2+/5, 2/11: 3-/5, 04/16/19: 3-/5, 05/08/19: 3-/5, 5/24: 3-/5, 6/21: 3-/5, 7/14: 3-/5, 12/21: 3-/5. 03/01/2020- Left quad= 3-/5 (focusing on quad strengthening during visits and with home program). 03/31/2020- left quad = 3-/5 with  MMT. 04/19/2020= 3- /5 with left quad strength with manual muscle testing. 07/14/2020- Patient presents with 3-/5 and quickly fatigues with knee ext yet functionall able to stand of left LE without knee buckling- yet unable to terminally extend left knee with static standing.    Time 12    Period Weeks    Status On-going    Target Date 10/06/20      PT LONG TERM GOAL  #9   TITLE Patient will report no vertigo with provoking motions or positions in order to be able to perform ADLs and mobility tasks without symptoms.    Baseline 06/30/19: reports no dizziness in last week; 6/21: one episode this weekend; 7/14: experiencing one a week; 03/01/2020.- Patient has denied any dizziness for over past 3 weeks.    Time 12    Period Weeks    Status Achieved      PT LONG TERM GOAL  #10   TITLE Patient will demonstrate improved functional LE/Postural strength as seen by ability to perform static  standing for 2 min or greater with max assist  to assist with strength required for ADL's and transfers/pregait/gait activities.    Baseline 03/01/2020- Patient able to stand approx 40 sec today with max assist in parallel bars. 03/31/2020- Patient able to demo static stand with max A for 32mn 47 sec today. 04/19/2020- Patient able to demo 2 min today with mod/max assist - yet remains inconsistent. 07/14/2020- Did not attempt today but during past visits during this cert- patient has been inconsistent standing for 30 sec to 2 min. Will keep goal active to strive for more consistency to help with strength for transfers.    Time 12    Period Weeks    Status On-going    Target Date 10/06/20      PT LONG TERM GOAL  #11   TITLE Patient will demonstrate improved static sitting- able to sit unsupported at back for 5 min without evidence of listing to left for improved sitting and postural control to prevent excessive pressure through left hip/arm with sitting.    Baseline 07/14/2020- Patient presents with progressive listing to  left side over past month increasing risk for skin breakdown, pain in low back, and long term postural abnormalites.    Time 12    Period Weeks    Status New    Target Date 10/06/20               Plan - 08/16/20 1414     Clinical Impression Statement Pt presented to PT with excellent motivation today. His daughter was present throughout session. He stood x4 reps with MAX Ax 2. Exercise was ended due to pt report of R shoulder pain. Pt reported all LLE stretches and PROM felt good. Assist was provided with all LLE therex. Will continue to focus on strengthening as appropriate. will continue to benefit from skilled PT services to improve his functional strength and postural control for improved transfers/standing    Personal Factors and Comorbidities Age;Comorbidity 3+    Comorbidities Relevant past medical history and comorbidities include long term steroid use for lupus, CKD, chronic osteomyelitis, cervical spine stenosis, BPH, APS, alcohol abuse, peripheral artery disease, depression, GERD, obstructive sleep apnea, HTN, IBS, IgA deficiency, lumbar radiculopathy, RA, Stroke, toxic maculopathy in both eyes, systemic lupus erythematosus related syndrome, cardiac catheterization, carotid endarterectomy, cervical laminectomy, coronary angioplasty, Fusion C5-C7, knee arthroplasty, lumbar surgery.    Examination-Activity Limitations Bed Mobility;Bend;Sit;Toileting;Stand;Stairs;Lift;Transfers;Squat;Locomotion Level;Carry;Dressing;Hygiene/Grooming;Continence    Examination-Participation Restrictions Yard Work;Interpersonal Relationship;Community Activity    Stability/Clinical Decision Making Evolving/Moderate complexity    Rehab Potential Fair    PT Frequency 2x / week    PT Duration 12 weeks    PT Treatment/Interventions ADLs/Self Care Home Management;Electrical Stimulation;Moist Heat;Cryotherapy;Gait training;Stair training;Functional mobility training;Therapeutic exercise;Balance  training;Neuromuscular re-education;Cognitive remediation;Patient/family education;Orthotic Fit/Training;Wheelchair mobility training;Manual techniques;Passive range of motion;Energy conservation;Joint Manipulations;Canalith Repostioning;Therapeutic activities;Vestibular    PT Next Visit Plan Continue with progressive postural/LE strengthening/pregait activities    PT Home Exercise Plan no changes    Consulted and Agree with Plan of Care Patient;Family member/caregiver             Patient will benefit from skilled therapeutic intervention in order to improve the following deficits and impairments:  Abnormal gait, Decreased activity tolerance, Decreased cognition, Decreased endurance, Decreased knowledge of use of DME, Decreased range of motion, Decreased skin integrity, Decreased strength, Impaired perceived functional ability, Impaired sensation, Impaired UE functional use, Improper body mechanics, Pain, Cardiopulmonary status limiting activity, Decreased balance, Decreased coordination, Decreased mobility, Difficulty walking, Impaired tone,  Postural dysfunction, Dizziness  Visit Diagnosis: Muscle weakness (generalized)  Other lack of coordination  Abnormality of gait and mobility  Difficulty in walking, not elsewhere classified  Hemiplegia and hemiparesis following cerebral infarction affecting left non-dominant side [facility])     Problem List Patient Active Problem List   Diagnosis Date Noted   Sepsis ([place]) 10/18/2018   Long term (current) use of anticoagulants 05/01/2018   History of stroke 04/11/2018   Hyperlipidemia 07/04/2017   Chronic diarrhea 06/12/2017   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 ([place]) 04/27/2016   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   Fatigue 02/06/2015   Visual changes 06/19/2013   Benign prostatic hyperplasia 01/07/2013   Depression 01/07/2013   Restless legs syndrome 01/07/2013   Chronic kidney disease, stage 3  unspecified ([place]) 12/20/2011   CAD (coronary artery disease) 11/03/2010   Hypertension 11/03/2010   PAD (peripheral artery disease) ([place]) 11/03/2010   SLE (systemic lupus erythematosus) ([place]) 11/03/2010   Patrina Levering PT, DPT  [facility] 08/16/2020, 2:28 PM  [place] MAIN [facility] SERVICES [address] [place], [place], [address] Phone: [phone]   Fax:  [phone]  Name: Kenneth Summers MRN: [id number] Date of Birth: 10-Mar-1945

## 2020-08-18 ENCOUNTER — Ambulatory Visit: Payer: Medicare Other | Admitting: Physical Therapy

## 2020-08-18 ENCOUNTER — Encounter: Payer: Self-pay | Admitting: Occupational Therapy

## 2020-08-18 ENCOUNTER — Other Ambulatory Visit: Payer: Self-pay

## 2020-08-18 ENCOUNTER — Ambulatory Visit: Payer: Medicare Other | Admitting: Occupational Therapy

## 2020-08-18 DIAGNOSIS — R269 Unspecified abnormalities of gait and mobility: Secondary | ICD-10-CM | POA: Diagnosis not present

## 2020-08-18 DIAGNOSIS — R296 Repeated falls: Secondary | ICD-10-CM

## 2020-08-18 DIAGNOSIS — M6281 Muscle weakness (generalized): Secondary | ICD-10-CM

## 2020-08-18 DIAGNOSIS — R278 Other lack of coordination: Secondary | ICD-10-CM

## 2020-08-18 DIAGNOSIS — R262 Difficulty in walking, not elsewhere classified: Secondary | ICD-10-CM

## 2020-08-18 DIAGNOSIS — R2689 Other abnormalities of gait and mobility: Secondary | ICD-10-CM

## 2020-08-18 DIAGNOSIS — I69354 Hemiplegia and hemiparesis following cerebral infarction affecting left non-dominant side: Secondary | ICD-10-CM

## 2020-08-18 NOTE — Therapy (Signed)
Kenneth Summers MAIN [facility] SERVICES [address] [place], [place], [address] Phone: [phone]   Fax:  [phone]  Occupational Therapy Treatment  Patient Details  Name: Kenneth Summers MRN: [id number] Date of Birth: [birth year] No data recorded  Encounter Date: 08/18/2020   OT End of Session - 08/18/20 1441     Visit Number [id number]    Number of Visits 4    Date for OT Re-Evaluation 11/08/20    Authorization Type Progress report period starting 6/062022    OT Start Time 1345    OT Stop Time 1430    OT Time Calculation (min) 45 min    Activity Tolerance Patient tolerated treatment well    Behavior During Therapy WFL for tasks assessed/performed             Past Medical History:  Diagnosis Date   Actinic keratosis    Alcohol abuse    APS (antiphospholipid syndrome) (HCC)    Basal cell carcinoma    BPH (benign prostatic hyperplasia)    CAD (coronary artery disease)    Cellulitis    Cervical spinal stenosis    Chronic osteomyelitis (HCC)    CKD (chronic kidney disease)    CKD (chronic kidney disease)    Clostridium difficile diarrhea    Depression    Diplopia    Fatigue    GERD (gastroesophageal reflux disease)    Hyperlipemia    Hypertension    Hypovitaminosis D    IBS (irritable bowel syndrome)    IgA deficiency (HCC)    Insomnia    Left lumbosacral radiculopathy    Moderate obstructive sleep apnea    Osteomyelitis of foot (HCC)    PAD (peripheral artery disease) (HCC)    Pruritus    RA (rheumatoid arthritis) (HCC)    Radiculopathy    Restless legs syndrome    RLS (restless legs syndrome)    SLE (systemic lupus erythematosus related syndrome) ([place])    Squamous cell carcinoma of skin 01/27/2019   right mid lower ear helix   Stroke ([place])    Toxic maculopathy of both eyes     Past Surgical History:  Procedure Laterality Date   CARDIAC CATHETERIZATION     CAROTID ENDARTERECTOMY     CERVICAL LAMINECTOMY      CORONARY ANGIOPLASTY     FRACTURE SURGERY     fusion C5-6-7     HERNIA REPAIR     KNEE ARTHROSCOPY     TONSILLECTOMY      There were no vitals filed for this visit.   Subjective Assessment - 08/18/20 1440     Subjective  Pt. reports feeling better today.    Patient is accompanied by: Family member    Pertinent History Pt. is a 75 y.o. 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 Pt. PMHx includes: Multiple Falls, Lupus, DJD, Renal Abscess, CVA. Pt. resides with his wife. Pt.'s wife and daughter assist with ADLs. Pt. has caregivers in  for 2 hours a day, 6 days a week. Pt. received Rehab services in acute care, at SNF for STR, and [employer] services. Pt. is retired from [employer] for [employer] and [employer].    Currently in Pain? No/denies            OT TREATMENT     Therapeutic Exercise:   Pt. worked on [employer], and reciprocal motion using the UBE while seated for 8 min. with no resistance.  Pt. worked on 3# dumbbell ex. for bilateral elbow flexion and extension, and right supination/pronation,  and 2# for the left wrist extension. Pt. Performed red theraband exercises for right shoulder flexion, abduction, diagonal shoulder abduction. Pt. required assist stabilizing the theraband with his left hand. Pt. worked on reaching up with his LUE for cones.   Neuromuscular re-education:  Pt. worked on grasping, and manipulating 1", 3/4", and 1/2" washers from a magnetic dish using point grasp pattern. Pt. worked on reaching up, stabilizing, and sustaining shoulder elevation while placing the washer over a small precise target on vertical dowels positioned at various angles.     Pt. reports left lateral torso pain today following PT. Pt. continues to make steady progress with BUE strength, and tolerance for there. ex. Pt. required fewer rest breaks throughout the session. Pt. required less verbal cues, and cues for visual demonstration for  movement patterns, however required cues for visual demonstration. Pt. continues to require consistent verbal, visual, and tactile cues. Pt. was able to maintain upright sitting in midline with a towel roll. Pt. continues to require cues to perform UE strengthening exercises, technique, and form as well as visual demonstration prior to the exercises. Pt. continues to work on improving BUE strength, and [facility] skills in order to improve LUE functioning during ADLs, IADL tasks.                                      OT Education - 08/18/20 1441     Education Details strengthening, UE functioning    Person(s) Educated Patient;Caregiver(s)    Methods Explanation;Demonstration;Handout    Comprehension Returned demonstration;Tactile cues required;Verbalized understanding;Need further instruction                 OT Long Term Goals - 08/16/20 1730       OT LONG TERM GOAL #1   Title Pt. will increase left shoulder flexion AROM by 10 degrees to access cabinet/shelf.    Baseline Left shoulder flexion 119 degrees. Pt. is able to reach into cabinets with more ease, however has difficulty getting close enough to them with his w/c.Pt. continues to present with limited left shoulder ROM limitng access to cabinets, and shelves.03/01/2020: Left shoulder 116 degrees with new onset of numbness radiating from the left side of the neck to the hand. Pt. continues to progress with left shoulder ROM .Shouler flexion 130, and continues to work on reaching with increasing emphasis on flexing trunk to improve functional reach. 200th visit: shoulder flexion to 130 with effort    Time 12    Period Weeks    Status On-going    Target Date 11/08/20      OT LONG TERM GOAL #2   Title Pt. will donn a shirt with Supervision.    Baseline ModA. Pt. is assisting more with UE dressing at home. Pt. continues to require modA donning a zip down jacket with consistent cues to initiate the correct direction of  the jacket.  Min-modA mahaging the zipper. MinA for donning a T-shirt with assist to roll side to side to pull the shirt down over his back. 200th visit: continues to require min assist at times for shirt/jacket    Time 12    Period Weeks    Status On-going    Target Date 11/08/20      OT LONG TERM GOAL #3   Baseline MaxA left, ModA with  the right. Pt. has sores on his ankle, and feet. Pt.'s daughter is assisting.      OT LONG TERM GOAL #4   Title Pt. will improve left grip strength by 10# to be able to open a jar/container.    Baseline Grip strength continues to be limited. Decreased left sided grip strength today secondary to a new onset of numbenss, and weakness radiating from the neck to the left hand. 200th visit: Pt. continues to have a harder time opening a wide mouthed jar, however is able to open a smaller jar easier.    Time 12    Period Weeks    Status On-going    Target Date 11/08/20      OT LONG TERM GOAL #5   Title Pt. will improve left hand [facility] skills to be able to assist with buttoning/zipping.      OT LONG TERM GOAL #6   Title Pt. will demonstrate visual conmpensatory strategies for 100% of the time during ADLs    Baseline Improving, however Pt. continues to require intermittent cues for left sides awareness.    Time 12    Period Weeks    Status On-going    Target Date 08/16/20      OT LONG TERM GOAL #8   Title Pt. will accurately identify potential safety hazard using good safety awareness, and judgement 100% for ADLs, and IADLs.    Baseline Pt. continues to require cues    Time 12    Period Weeks    Status On-going    Target Date 11/08/20      OT LONG TERM GOAL  #9   TITLE Pt. will  navigate his w/c around obstacles with Supervision and 100% accuracy    Baseline Pt. has progressed, however, continues to require cuing for obstacles on the left with fewer  cues overall. Pt. requires significant cues to engage his left UE during tasks.. 200th visit: continues to  bump into walls at home with the w/ct at times    Time 12    Period Weeks    Status On-going    Target Date 11/08/20      OT LONG TERM GOAL  #10   TITLE Pt. will independently be able  to reach to place items into cabinetry, and closets.    Baseline Pt. continues to have difficulty with functional reaching continues to be limited.    Time 12    Period Weeks    Status On-going    Target Date 11/08/20      OT LONG TERM GOAL  #11   TITLE Pt. will increase BUE strength by 16mm grades in preparation for ADL transfers.    Baseline Pt. is progressing, however BUE strength continues to be limited. Pt. is able to tolerate increased resistive weights.    Time 12    Period Weeks    Status On-going    Target Date 11/08/20      OT LONG TERM GOAL  #12   TITLE Pt. will improve LUE functional reaching with the left engaging the trunk 100% of the time during ADL tasks with minimal cues.    Baseline Pt. is limited with LUE functional reaching.    Time 12    Period Weeks    Status On-going    Target Date 11/08/20                   Plan - 08/18/20 1441     Clinical Impression Statement Pt.  Reports left lateral torso pain today following PT. Pt. continues to make steady progress with BUE strength, and tolerance for there. ex. Pt. required fewer rest breaks throughout the session. Pt. required frequent verbal cues, and cues for visual demonstration for movement patterns, however required cues for visual demonstration. Pt. continues to require consistent verbal, visual, and tactile cues. Pt. continues to require cues for radial deviation. Pt. was able to maintain upright sitting in midline with a towel roll. Pt. continues to require cues to perform UE strengthening exercises, technique, and form as well as visual demonstration prior to the exercises. Pt. continues to work on improving BUE strength, and [facility] skills in order to improve LUE functioning during ADLs, IADL tasks.     OT Occupational  Profile and History Problem Focused Assessment - Including review of records relating to presenting problem    Occupational performance deficits (Please refer to evaluation for details): ADL's    Body Structure / Function / Physical Skills UE functional use;Coordination;FMC;Dexterity;Strength;ROM    Cognitive Skills Attention;Memory;Emotional;Problem Solve;Safety Awareness    Rehab Potential Good    Clinical Decision Making Several treatment options, min-mod task modification necessary    Comorbidities Affecting Occupational Performance: Presence of comorbidities impacting occupational performance    Comorbidities impacting occupational performance description: Phyical, cognitive, visual,  medical comorbidities    Modification or Assistance to Complete Evaluation  No modification of tasks or assist necessary to complete eval    OT Frequency 2x / week    OT Duration 12 weeks    OT Treatment/Interventions Self-care/ADL training;DME and/or AE instruction;Therapeutic exercise;Therapeutic activities;Moist Heat;Cognitive remediation/compensation;Neuromuscular education;Visual/perceptual remediation/compensation;Coping strategies training;Patient/family education;Passive range of motion;Psychosocial skills training;Energy conservation;Functional Mobility Training    Consulted and Agree with Plan of Care Patient;Family member/caregiver             Patient will benefit from skilled therapeutic intervention in order to improve the following deficits and impairments:   Body Structure / Function / Physical Skills: UE functional use, Coordination, FMC, Dexterity, Strength, ROM Cognitive Skills: Attention, Memory, Emotional, Problem Solve, Safety Awareness     Visit Diagnosis: Muscle weakness (generalized)  Other lack of coordination    Problem List Patient Active Problem List   Diagnosis Date Noted   Sepsis ([place]) 10/18/2018   Long term (current) use of anticoagulants 05/01/2018   History of  stroke 04/11/2018   Hyperlipidemia 07/04/2017   Chronic diarrhea 06/12/2017   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 ([place]) 04/27/2016   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   Fatigue 02/06/2015   Visual changes 06/19/2013   Benign prostatic hyperplasia 01/07/2013   Depression 01/07/2013   Restless legs syndrome 01/07/2013   Chronic kidney disease, stage 3 unspecified ([place]) 12/20/2011   CAD (coronary artery disease) 11/03/2010   Hypertension 11/03/2010   PAD (peripheral artery disease) ([place]) 11/03/2010   SLE (systemic lupus erythematosus) ([place]) 11/03/2010    Harrel Carina, MS, OTR/L 08/18/2020, 2:44 PM  [place] MAIN [facility] SERVICES [place], [place], [address] Phone: [phone]   Fax:  [phone]  Name: Kenneth Summers MRN: [id number] Date of Birth: 01-Apr-1945

## 2020-08-18 NOTE — Therapy (Signed)
[place] MAIN [facility] SERVICES [address] [place], [place], [address] Phone: [phone]   Fax:  [phone]  Physical Therapy Treatment  Patient Details  Name: Kenneth Summers MRN: [id number] Date of Birth: Dec 22, 1945 No data recorded  Encounter Date: 08/18/2020   PT End of Session - 08/18/20 1352     Visit Number [id number]    Number of Visits 301    Date for PT Re-Evaluation 10/06/20    Authorization Type Medicare reporting period starting 09/11/18; NO FOTO    Authorization Time Period Recert 6/96/2952-09/10/1322; Recert 4/0/1027-2/53/6644    PT Start Time 1304    PT Stop Time 1345    PT Time Calculation (min) 41 min    Equipment Utilized During Treatment Gait belt    Activity Tolerance No increased pain;Patient limited by lethargy;Patient limited by fatigue    Behavior During Therapy [facility] for tasks assessed/performed             Past Medical History:  Diagnosis Date   Actinic keratosis    Alcohol abuse    APS (antiphospholipid syndrome) (HCC)    Basal cell carcinoma    BPH (benign prostatic hyperplasia)    CAD (coronary artery disease)    Cellulitis    Cervical spinal stenosis    Chronic osteomyelitis (HCC)    CKD (chronic kidney disease)    CKD (chronic kidney disease)    Clostridium difficile diarrhea    Depression    Diplopia    Fatigue    GERD (gastroesophageal reflux disease)    Hyperlipemia    Hypertension    Hypovitaminosis D    IBS (irritable bowel syndrome)    IgA deficiency (HCC)    Insomnia    Left lumbosacral radiculopathy    Moderate obstructive sleep apnea    Osteomyelitis of foot (HCC)    PAD (peripheral artery disease) (HCC)    Pruritus    RA (rheumatoid arthritis) (HCC)    Radiculopathy    Restless legs syndrome    RLS (restless legs syndrome)    SLE (systemic lupus erythematosus related syndrome) ([place])    Squamous cell carcinoma of skin 01/27/2019   right mid lower ear helix   Stroke ([place])     Toxic maculopathy of both eyes     Past Surgical History:  Procedure Laterality Date   CARDIAC CATHETERIZATION     CAROTID ENDARTERECTOMY     CERVICAL LAMINECTOMY     CORONARY ANGIOPLASTY     FRACTURE SURGERY     fusion C5-6-7     HERNIA REPAIR     KNEE ARTHROSCOPY     TONSILLECTOMY      There were no vitals filed for this visit.   Subjective Assessment - 08/18/20 1351     Subjective Patient states he is doing well today, no report of pain. He arrived with his caregiver Crystal.    Patient is accompained by: Family member    Pertinent History Patient is a 75 y.o. male who presents to outpatient physical therapy with a referral for medical diagnosis of CVA. This patient's chief complaints consist of left hemiplegia and overall deconditioning leading to the following functional deficits: dependent for ADLs, IADLs, unable to transfer to car, dependent transfers at home with hoyer lift, unable to walk or stand without significant assistance, difficulty with W/C navigation..    Limitations Sitting;Lifting;Standing;House hold activities;Writing;Walking;Other (comment)    Diagnostic tests Brain MRI 11/01/2017: IMPRESSION: 1. Acute right ACA territory infarct as demonstrated on  prior CT imaging. No intracranial hemorrhage or significant mass effect. 2. 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    Patient Stated Goals wants to be able to walk again    Currently in Pain? No/denies    Pain Onset Today    Pain Onset Today              INTERVENTIONS   Squat pivot transfer- Max assist x 2 to scoot forward and sit upright. He required Max A x2 to transfer PWC<>EOM. Stand pivot was attempted however he was unable to extend knees with standing today.   Sitting on edge of mat: Sitting postural exercises= 5 min of static sitting with vared levels of assist to maintain (MIN-MAX A) provided by caregiver and max VC and TC to sit upright without listing to the  left. AAROM marching and LAQ 2 x 10 BLE each exercise; sets separated by seated rest break with focus on posture and discovering midline.   Attempted standing x 3 trials from EOM (elevated surface) yet patient unable to stand erect despite max A of 2 people today. He was unable to achieve hip or knee extension and presented at increased risk of falling.  Therex: Seated cable row BUE 12.5#; x 20 reps low row and x20 reps high row   Clinical Impression: Patient presents with increased overall weakness in BLE. He was unable to achieve full standing position from elevated EOM and MAX A x2 people. Transfer was accomplished via squat pivot rather than typical stand pivot. He maintained sitting EOM for >20 minutes; sitting bout was followed by fatigue and pt provided less assist when transferring back to chair. After stating that the w/c is uncomfortable to sit in, PT recommended a ROHO cushion and provided information on it. He will continue to benefit from skilled PT services to improve his functional strength and postural control for improved transfers/standing         PT Short Term Goals - 07/14/20 7989       PT SHORT TERM GOAL #1   Title Be independent with initial home exercise program for self-management of symptoms.    Baseline HEP to be given at second session; advice to practice unsupported sitting at home (06/19/2018); 07/08/18: Performing at home, 6/29: needs assistance but is doing exercise program regularly; 08/22/18: adherent;    Time 2    Period Weeks    Status Achieved    Target Date 06/26/18      PT SHORT TERM GOAL #2   Title Patient will perform sit to stand transfer with mod Assist in parallel bars for improved transfer ability with decreased caregiver assist.    Baseline 04/19/2020- Max assist of 1 person. 07/14/2020- Patient continues to require Max assist for sit to stand transfers    Time 6    Period Weeks    Status On-going    Target Date 08/25/20               PT  Long Term Goals - 07/21/20 0723       PT LONG TERM GOAL #1   Title Patient will complete all bed mobility with min A to improve functional independence for getting in and out of bed and adjusting in bed.     Baseline max A - total A reported by family (06/19/2018); 07/08/18: still requires maxA, dtr reports improvement in rolling since starting therapy; 6/23: min A to supervision in clinic; not doing at home right; 8/5: Pt's  daughter states that his rolling has improved, but still has difficulty with all other bed mobility; 10/23/18: pts daughter states that she is doing about 35% of the work if he is in proper position, he does uses the bed rails to help roll;  using Harrel Lemon for coming to sit EOB; 10/12: pts daughter states that she is doing about 30% of the work if he is in proper position, he does uses the bed rails to help roll, but it is improving;  using Harrel Lemon for coming to sit EOB, 10/29: min A for sit to sidelying, rolling supervision, able to transition sidelying to sit with HHA; 12/21: min-mod A. 03/01/2020- Patient presents improving bed mobility with current ability to roll to left with CGA and verbal and tactile cues and min assist to roll to right as seen by recent visits. 03/31/2020- Patient and caregiver report that he has maintained ability to roll side to side with only minimal assistance.    Time 12    Period Weeks    Status Achieved      PT LONG TERM GOAL #2   Title Patient will complete sit <> stand transfer chair to chair with Min A and LRAD to improve functional independence for household and community mobility.     Baseline min A for STS, still requires elevated surface height to 27".  03/01/20- Patient currently unable to perform a chair to chair transfer and continues to be transferred using hoyer lift. Patient has been training with sit to stand at parallel bars and able to currently perform sit to stand with max assist  from 27in. high surface.03/31/2020- Patient remains at [facility] assist  with sit to stand transfer and continued use of hoyer lift transfer for all chair to chair or bed to chair transfers. 04/19/2020- Patient requires Max assist with sit to stand transfers and requires hoyer for chair to chair transfers. 07/14/2020- Patient has continued to require max assist since Feb for sit to stand transfers- Do not anticipate that this is a realistic goal at this time.    Time 12    Period Weeks    Status Not Met    Target Date --   Goal no longer appropriate.     PT LONG TERM GOAL #3   Title Patient will navigate power w/c with min A x 100 feet to improve mobility for household and short community distances.    Baseline required total A (06/12/2018); able to roll 20 feet with min A (06/20/2018); 07/08/18: Pt can go approximately 10' without assist per family;  10/23/2018: pt's daughter states that his power WC mobility has become 100% better with the new hand control, slowed speed, and with practice; 10/12: pt's daughter reports that he can perform household WC mobility and limited community ambulation with minA    Time 12    Period Weeks    Status Achieved      PT LONG TERM GOAL #4   Title Patient will complete car transfers W/C to family SUV with min A using LRAD to improve his abilty to participate in community activities.     Baseline unable to complete car transfers at all (06/12/2018); unable to complete car transfers at all (06/19/2018).; 07/08/18: Unable to perform at this time; 09/11/18: unable to perform at this time; 10/23/2018: unable to complete car transfers at all; 10/12:  unable to complete car transfers at all, 10/29: unable; 01/13/19: unable; 2/11: min-mod A +2, 3/10: min A+1, 12/21: mod A. 03/01/2020- Patient unable to  complete car transfers and continues to require medical transportation at this time. 3/14- Patient reports he may be purchasing a conversion van later this week.07/14/2020- Patient did not purchase the conversion Lucianne Lei and continues to require medical transport for all  outings using his wheelchair.    Time 12    Period Weeks    Status Deferred    Target Date 10/06/20      PT LONG TERM GOAL #5   Title Patient will ambulate at least 10 feet with min A using LRAD to improve mobility for household and community distances.    Baseline unable to take steps (06/12/2018); able to shift R leg forward and back with max A and RW at edge of plinth (06/19/2018); 07/08/18: unable to ambulate at this time. 09/11/18: unable to perform at this time; 10/23/2018: unable to perform at this time; 10/12: unable to perform at this time, 10/29: unable at this time; 01/13/19: unable at this time, 2/11: min A to step with RLE, unable to step with LLE, 3/10: able to take 2 steps in parallel bars with min A for safety; 4/1: min A for walking 10 feet in parallel bars, 5/24: min A walking 10 feet in parallel bars; 6/21: no change 7/14: no change. 03/01/2020- Patient unable to ambulate and currently focusing on static standing. He was able to take a forward step with his right LE last week but unable today. 03/31/2020- Patient continues to be non-ambulatory but has been able to move right LE forward with standing but unable to take a step with left LE. 04/19/2020- Goal is still not appropriate as of yet- cont to focus on static and dynamic standing balance/pregait activities. 07/14/2020- Goal still not appropriate- working on ability to dynamically weight shift for transfers and continuing with static transfers.    Time 12    Period Weeks    Status Deferred    Target Date 10/06/20      Additional Long Term Goals   Additional Long Term Goals Yes      PT LONG TERM GOAL #6   Title Patient will be able to safety navigate up/down ramp with power chair, exhibiting good safety awareness, mod I to safely enter/exit his home.    Baseline 2/11: supervision with intermittent min A;4/1: supervision; 5/24: supervision. 03/01/2020- patient reports that he is able to navigate down the ramp at home to leave the home but  continues to need assist to navigate up the ramp into the home. 07/14/2020- Patient reports able to negotiate w/c in/out of home.    Time 12    Period Weeks    Status Achieved      PT LONG TERM GOAL #7   Title Patient will increase functional reach test to >15 inches in sitting to exhibit improved sitting balance and positioning and reduce fall risk;    Baseline 07/30/18: 12 inches; 09/11/18: 10-12 inches; 10/23/18: 12-13 inch; 10/12: 14.5", 10/29: 15.5 inch    Time 4    Period Weeks    Status Achieved      PT LONG TERM GOAL #8   Title Patient will increase LLE quad strength to 3+/5 to improve functional strength for standing and mobility;    Baseline 10/28: 2+/5; 01/13/19: 2+/5, 2/11: 3-/5, 04/16/19: 3-/5, 05/08/19: 3-/5, 5/24: 3-/5, 6/21: 3-/5, 7/14: 3-/5, 12/21: 3-/5. 03/01/2020- Left quad= 3-/5 (focusing on quad strengthening during visits and with home program). 03/31/2020- left quad = 3-/5 with MMT. 04/19/2020= 3- /5 with left quad strength with manual muscle  testing. 07/14/2020- Patient presents with 3-/5 and quickly fatigues with knee ext yet functionall able to stand of left LE without knee buckling- yet unable to terminally extend left knee with static standing.    Time 12    Period Weeks    Status On-going    Target Date 10/06/20      PT LONG TERM GOAL  #9   TITLE Patient will report no vertigo with provoking motions or positions in order to be able to perform ADLs and mobility tasks without symptoms.    Baseline 06/30/19: reports no dizziness in last week; 6/21: one episode this weekend; 7/14: experiencing one a week; 03/01/2020.- Patient has denied any dizziness for over past 3 weeks.    Time 12    Period Weeks    Status Achieved      PT LONG TERM GOAL  #10   TITLE Patient will demonstrate improved functional LE/Postural strength as seen by ability to perform static standing for 2 min or greater with max assist  to assist with strength required for ADL's and transfers/pregait/gait  activities.    Baseline 03/01/2020- Patient able to stand approx 40 sec today with max assist in parallel bars. 03/31/2020- Patient able to demo static stand with max A for 13min 47 sec today. 04/19/2020- Patient able to demo 2 min today with mod/max assist - yet remains inconsistent. 07/14/2020- Did not attempt today but during past visits during this cert- patient has been inconsistent standing for 30 sec to 2 min. Will keep goal active to strive for more consistency to help with strength for transfers.    Time 12    Period Weeks    Status On-going    Target Date 10/06/20      PT LONG TERM GOAL  #11   TITLE Patient will demonstrate improved static sitting- able to sit unsupported at back for 5 min without evidence of listing to left for improved sitting and postural control to prevent excessive pressure through left hip/arm with sitting.    Baseline 07/14/2020- Patient presents with progressive listing to left side over past month increasing risk for skin breakdown, pain in low back, and long term postural abnormalites.    Time 12    Period Weeks    Status New    Target Date 10/06/20                   Plan - 08/18/20 1353     Clinical Impression Statement Patient presents with increased overall weakness in BLE. He was unable to achieve full standing position from elevated EOM and MAX A x2 people. Transfer was accomplished via squat pivot rather than typical stand pivot. He maintained sitting EOM for >20 minutes; sitting bout was followed by fatigue and pt provided less assist when transferring back to chair. After stating that the w/c is uncomfortable to sit in, PT recommended a ROHO cushion and provided information on it. 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  Examination-Activity Limitations Bed Mobility;Bend;Sit;Toileting;Stand;Stairs;Lift;Transfers;Squat;Locomotion Level;Carry;Dressing;Hygiene/Grooming;Continence    Examination-Participation Restrictions Yard Work;Interpersonal Relationship;Community Activity    Stability/Clinical Decision Making Evolving/Moderate complexity    Rehab Potential Fair    PT Frequency 2x / week    PT Duration 12 weeks    PT Treatment/Interventions ADLs/Self Care Home Management;Electrical Stimulation;Moist Heat;Cryotherapy;Gait training;Stair training;Functional mobility training;Therapeutic exercise;Balance training;Neuromuscular re-education;Cognitive remediation;Patient/family education;Orthotic Fit/Training;Wheelchair mobility training;Manual techniques;Passive range of motion;Energy conservation;Joint Manipulations;Canalith Repostioning;Therapeutic activities;Vestibular    PT Next Visit Plan Continue with progressive postural/LE strengthening/pregait activities    PT Home Exercise Plan no changes    Consulted and Agree with Plan of Care Patient;Family member/caregiver             Patient will benefit from skilled therapeutic intervention in order to improve the following deficits and impairments:  Abnormal gait, Decreased activity tolerance, Decreased cognition, Decreased endurance, Decreased knowledge of use of DME, Decreased range of motion, Decreased skin integrity, Decreased strength, Impaired perceived functional ability, Impaired sensation, Impaired UE  functional use, Improper body mechanics, Pain, Cardiopulmonary status limiting activity, Decreased balance, Decreased coordination, Decreased mobility, Difficulty walking, Impaired tone, Postural dysfunction, Dizziness  Visit Diagnosis: Muscle weakness (generalized)  Other lack of coordination  Abnormality of gait and mobility  Difficulty in walking, not elsewhere classified  Hemiplegia and hemiparesis following cerebral infarction affecting left non-dominant side (HCC)  Other abnormalities of gait and mobility  Repeated falls     Problem List Patient Active Problem List   Diagnosis Date Noted   Sepsis ([place]) 10/18/2018   Long term (current) use of anticoagulants 05/01/2018   History of stroke 04/11/2018   Hyperlipidemia 07/04/2017   Chronic diarrhea 06/12/2017   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 ([place]) 04/27/2016   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   Fatigue 02/06/2015   Visual changes 06/19/2013   Benign prostatic hyperplasia 01/07/2013   Depression 01/07/2013   Restless legs syndrome 01/07/2013   Chronic kidney disease, stage 3 unspecified ([place]) 12/20/2011   CAD (coronary artery disease) 11/03/2010   Hypertension 11/03/2010   PAD (peripheral artery disease) ([place]) 11/03/2010   SLE (systemic lupus erythematosus) ([place]) 11/03/2010   Patrina Levering PT, DPT  [facility] 08/18/2020, 2:06 PM  [place] MAIN [facility] SERVICES [address] [place], [place], [address] Phone: [phone]   Fax:  [phone]  Name: Kenneth Summers MRN: [id number] Date of Birth: 05/11/1945

## 2020-08-23 ENCOUNTER — Ambulatory Visit: Payer: Medicare Other

## 2020-08-23 ENCOUNTER — Ambulatory Visit: Payer: Medicare Other | Admitting: Occupational Therapy

## 2020-08-23 ENCOUNTER — Other Ambulatory Visit: Payer: Self-pay

## 2020-08-23 DIAGNOSIS — R262 Difficulty in walking, not elsewhere classified: Secondary | ICD-10-CM

## 2020-08-23 DIAGNOSIS — M6281 Muscle weakness (generalized): Secondary | ICD-10-CM

## 2020-08-23 DIAGNOSIS — R278 Other lack of coordination: Secondary | ICD-10-CM

## 2020-08-23 DIAGNOSIS — R269 Unspecified abnormalities of gait and mobility: Secondary | ICD-10-CM | POA: Diagnosis not present

## 2020-08-23 NOTE — Therapy (Signed)
[place] MAIN [facility] SERVICES [address] [place], [place], [address] Phone: [phone]   Fax:  [phone]  Occupational Therapy Progress Note                                                                      Dates of reporting period  07/12/2020   to   08/23/2020   Patient Details  Name: Kenneth Summers MRN: [id number] Date of Birth: [birth year] No data recorded  Encounter Date: 08/23/2020   OT End of Session - 08/23/20 1410     Visit Number 210    Number of Visits 254    Date for OT Re-Evaluation 08/23/2020   Authorization Type Progress report period starting 6/062022    OT Start Time 1348    OT Stop Time 1430    OT Time Calculation (min) 42 min    Equipment Utilized During Treatment pegboard and patterns    Activity Tolerance Patient tolerated treatment well    Behavior During Therapy WFL for tasks assessed/performed             Past Medical History:  Diagnosis Date   Actinic keratosis    Alcohol abuse    APS (antiphospholipid syndrome) (HCC)    Basal cell carcinoma    BPH (benign prostatic hyperplasia)    CAD (coronary artery disease)    Cellulitis    Cervical spinal stenosis    Chronic osteomyelitis (HCC)    CKD (chronic kidney disease)    CKD (chronic kidney disease)    Clostridium difficile diarrhea    Depression    Diplopia    Fatigue    GERD (gastroesophageal reflux disease)    Hyperlipemia    Hypertension    Hypovitaminosis D    IBS (irritable bowel syndrome)    IgA deficiency (HCC)    Insomnia    Left lumbosacral radiculopathy    Moderate obstructive sleep apnea    Osteomyelitis of foot (HCC)    PAD (peripheral artery disease) (HCC)    Pruritus    RA (rheumatoid arthritis) (HCC)    Radiculopathy    Restless legs syndrome    RLS (restless legs syndrome)    SLE (systemic lupus erythematosus related syndrome) ([place])    Squamous cell carcinoma of skin 01/27/2019   right mid lower ear helix   Stroke  ([place])    Toxic maculopathy of both eyes     Past Surgical History:  Procedure Laterality Date   CARDIAC CATHETERIZATION     CAROTID ENDARTERECTOMY     CERVICAL LAMINECTOMY     CORONARY ANGIOPLASTY     FRACTURE SURGERY     fusion C5-6-7     HERNIA REPAIR     KNEE ARTHROSCOPY     TONSILLECTOMY      There were no vitals filed for this visit.   Subjective Assessment - 08/23/20 1409     Subjective  Pt. reports feeling better today.    Patient is accompanied by: Family member    Pertinent History Pt. is a 75 y.o. 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 Pt. PMHx includes: Multiple Falls, Lupus, DJD, Renal Abscess, CVA. Pt. resides with his wife. Pt.'s wife  and daughter assist with ADLs. Pt. has caregivers in  for 2 hours a day, 6 days a week. Pt. received Rehab services in acute care, at SNF for STR, and [place] services. Pt. is retired from [employer] for [employer] and [employer].    Currently in Pain? Yes            OT TREATMENT     Therapeutic Exercise:   Pt. worked on BUE strengthening, and reciprocal motion using the UBE while seated for 8 min. with no resistance.  Pt. performed 2.5# dowel for UE strengthening secondary to weakness. Shoulder flexion, chest press, and circular patterns, were performed for 1 set 10 reps each with cues to keep the dowel symmetrical, and level. Pt. worked on 3# dumbbell ex. for bilateral elbow flexion and extension, and right supination/pronation,  and 2# for the left wrist extension.    Pt. reports that he is feeling much better than last session. Pt. was amore alert. Pt. continues to make steady progress with BUE strength, and tolerance for there. ex. Pt. required fewer rest breaks throughout the session. Pt. required frequent verbal cues, and cues for visual demonstration for movement patterns, however required cues for visual demonstration. Pt. continues to require consistent verbal, visual, and tactile  cues. Pt. was able to maintain upright sitting in midline with a towel roll. Pt. continues to require less cues to perform UE strengthening exercises, technique, and form as well as visual demonstration prior to the exercises. Pt. continues to work on improving BUE strength, and [facility] skills in order to improve LUE functioning during ADLs, IADL tasks.                     OT Education - 08/23/20 1410     Education Details strengthening, UE functioning    Person(s) Educated Patient;Caregiver(s)    Methods Explanation;Demonstration;Handout    Comprehension Returned demonstration;Tactile cues required;Verbalized understanding;Need further instruction                 OT Long Term Goals - 08/23/20 1415       OT LONG TERM GOAL #1   Title Pt. will increase left shoulder flexion AROM by 10 degrees to access cabinet/shelf.    Baseline Left shoulder flexion 119 degrees. Pt. is able to reach into cabinets with more ease, however has difficulty getting close enough to them with his w/c.Pt. continues to present with limited left shoulder ROM limitng access to cabinets, and shelves.03/01/2020: Left shoulder 116 degrees with new onset of numbness radiating from the left side of the neck to the hand. Pt. continues to progress with left shoulder ROM .Shouler flexion 130, and continues to work on reaching with increasing emphasis on flexing trunk to improve functional reach. 200th visit: shoulder flexion to 130 with effort    Time 12    Period Weeks    Status On-going    Target Date 11/08/20      OT LONG TERM GOAL #2   Title Pt. will donn a shirt with Supervision.    Baseline ModA. Pt. is assisting more with UE dressing at home. Pt. continues to require modA donning a zip down jacket with consistent cues to initiate the correct direction of the jacket.  Min-modA mahaging the zipper. MinA for donning a T-shirt with assist to roll side to side to pull the shirt down over his back. 200th  visit: continues to require min assist at times for shirt/jacket    Period Weeks  Status On-going    Target Date 11/08/20      OT LONG TERM GOAL #4   Title Pt. will improve left grip strength by 10# to be able to open a jar/container.    Baseline Grip strength continues to be limited. Decreased left sided grip strength today secondary to a new onset of numbenss, and weakness radiating from the neck to the left hand. 200th visit: Pt. continues to have a harder time opening a wide mouthed jar, however is able to open a smaller jar easier.    Time 12    Period Weeks    Status On-going    Target Date 11/08/20      OT LONG TERM GOAL #6   Title Pt. will demonstrate visual conmpensatory strategies for 100% of the time during ADLs    Baseline Improving, however Pt. continues to require intermittent cues for left sides awareness.    Time 12    Period Weeks    Status On-going    Target Date 02/09/20      OT LONG TERM GOAL #8   Title Pt. will accurately identify potential safety hazard using good safety awareness, and judgement 100% for ADLs, and IADLs.    Baseline Pt. continues to require cues    Time 12    Period Weeks    Status On-going    Target Date 11/08/20      OT LONG TERM GOAL  #9   TITLE Pt. will  navigate his w/c around obstacles with Supervision and 100% accuracy    Baseline Pt. has progressed, however, continues to require cuing for obstacles on the left with fewer  cues overall. Pt. requires significant cues to engage his left UE during tasks.. 200th visit: continues to bump into walls at home with the w/ct at times    Time 12    Period Weeks    Status On-going    Target Date 11/08/20      OT LONG TERM GOAL  #10   TITLE Pt. will independently be able  to reach to place items into cabinetry, and closets.    Baseline Pt. continues to have difficulty with functional reaching continues to be limited.    Time 12    Period Weeks    Status On-going    Target Date 11/08/20       OT LONG TERM GOAL  #11   TITLE Pt. will increase BUE strength by 58mm grades in preparation for ADL transfers.    Baseline Pt. is progressing, however BUE strength continues to be limited. Pt. is able to tolerate increased resistive weights.    Time 12    Period Weeks    Status On-going    Target Date 11/08/20      OT LONG TERM GOAL  #12   TITLE Pt. will improve LUE functional reaching with the left engaging the trunk 100% of the time during ADL tasks with minimal cues.    Baseline Pt. is limited with LUE functional reaching.    Time 12    Period Weeks    Status On-going    Target Date 11/08/20                   Plan - 08/23/20 1410     Clinical Impression Statement Pt. reports that he is feeling much better than last session. Pt. was amore alert. Pt. continues to make steady progress with BUE strength, and tolerance for there. ex. Pt. required fewer rest breaks  throughout the session. Pt. required frequent verbal cues, and cues for visual demonstration for movement patterns, however required cues for visual demonstration. Pt. continues to require consistent verbal, visual, and tactile cues. Pt. was able to maintain upright sitting in midline with a towel roll. Pt. continues to require less cues to perform UE strengthening exercises, technique, and form as well as visual demonstration prior to the exercises. Pt. continues to work on improving BUE strength, and [facility] skills in order to improve LUE functioning during ADLs, IADL tasks.     OT Occupational Profile and History Problem Focused Assessment - Including review of records relating to presenting problem    Occupational performance deficits (Please refer to evaluation for details): ADL's    Body Structure / Function / Physical Skills UE functional use;Coordination;FMC;Dexterity;Strength;ROM    Cognitive Skills Attention;Memory;Emotional;Problem Solve;Safety Awareness    Rehab Potential Good    Clinical Decision Making Several  treatment options, min-mod task modification necessary    Comorbidities impacting occupational performance description: Phyical, cognitive, visual,  medical comorbidities    Modification or Assistance to Complete Evaluation  No modification of tasks or assist necessary to complete eval    OT Frequency 2x / week    OT Treatment/Interventions Self-care/ADL training;DME and/or AE instruction;Therapeutic exercise;Therapeutic activities;Moist Heat;Cognitive remediation/compensation;Neuromuscular education;Visual/perceptual remediation/compensation;Coping strategies training;Patient/family education;Passive range of motion;Psychosocial skills training;Energy conservation;Functional Mobility Training    Plan Continue with current OT plan of care.    Consulted and Agree with Plan of Care Patient;Family member/caregiver    Family Member Consulted Daughter [name]             Patient will benefit from skilled therapeutic intervention in order to improve the following deficits and impairments:   Body Structure / Function / Physical Skills: UE functional use, Coordination, FMC, Dexterity, Strength, ROM Cognitive Skills: Attention, Memory, Emotional, Problem Solve, Safety Awareness     Visit Diagnosis: Muscle weakness (generalized)  Other lack of coordination    Problem List Patient Active Problem List   Diagnosis Date Noted   Sepsis ([place]) 10/18/2018   Long term (current) use of anticoagulants 05/01/2018   History of stroke 04/11/2018   Hyperlipidemia 07/04/2017   Chronic diarrhea 06/12/2017   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 ([place]) 04/27/2016   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   Fatigue 02/06/2015   Visual changes 06/19/2013   Benign prostatic hyperplasia 01/07/2013   Depression 01/07/2013   Restless legs syndrome 01/07/2013   Chronic kidney disease, stage 3 unspecified ([place]) 12/20/2011   CAD (coronary artery disease) 11/03/2010    Hypertension 11/03/2010   PAD (peripheral artery disease) ([place]) 11/03/2010   SLE (systemic lupus erythematosus) ([place]) 11/03/2010    Harrel Carina, MS, OTR/L 08/23/2020, 2:24 PM  [place] MAIN [facility] SERVICES [place], [place], [address] Phone: [phone]   Fax:  [phone]  Name: THEOPHIL THIVIERGE MRN: [id number] Date of Birth: Oct 29, 1945

## 2020-08-23 NOTE — Therapy (Signed)
[place] [facility] [address] [place], [place], [address] Phone: [phone]   Fax:  [phone]  Physical Therapy Treatment  Patient Details  Name: Kenneth Summers MRN: [id number] Date of Birth: November 05, 1945 No data recorded  Encounter Date: 08/23/2020   PT End of Session - 08/23/20 1357     Visit Number 259    Number of Visits 301    Date for PT Re-Evaluation 10/06/20    Authorization Type Medicare reporting period starting 09/11/18; NO FOTO    Authorization Time Period Recert 10/22/9448-04/14/8826; Recert 0/0/3491-7/91/5056    PT Start Time 1301    PT Stop Time 1344    PT Time Calculation (min) 43 min    Equipment Utilized During Treatment Gait belt    Activity Tolerance No increased pain;Patient limited by lethargy;Patient limited by fatigue    Behavior During Therapy [facility] for tasks assessed/performed             Past Medical History:  Diagnosis Date   Actinic keratosis    Alcohol abuse    APS (antiphospholipid syndrome) (HCC)    Basal cell carcinoma    BPH (benign prostatic hyperplasia)    CAD (coronary artery disease)    Cellulitis    Cervical spinal stenosis    Chronic osteomyelitis (HCC)    CKD (chronic kidney disease)    CKD (chronic kidney disease)    Clostridium difficile diarrhea    Depression    Diplopia    Fatigue    GERD (gastroesophageal reflux disease)    Hyperlipemia    Hypertension    Hypovitaminosis D    IBS (irritable bowel syndrome)    IgA deficiency (HCC)    Insomnia    Left lumbosacral radiculopathy    Moderate obstructive sleep apnea    Osteomyelitis of foot (HCC)    PAD (peripheral artery disease) (HCC)    Pruritus    RA (rheumatoid arthritis) (HCC)    Radiculopathy    Restless legs syndrome    RLS (restless legs syndrome)    SLE (systemic lupus erythematosus related syndrome) ([place])    Squamous cell carcinoma of skin 01/27/2019   right mid lower ear helix   Stroke ([place])     Toxic maculopathy of both eyes     Past Surgical History:  Procedure Laterality Date   CARDIAC CATHETERIZATION     CAROTID ENDARTERECTOMY     CERVICAL LAMINECTOMY     CORONARY ANGIOPLASTY     FRACTURE SURGERY     fusion C5-6-7     HERNIA REPAIR     KNEE ARTHROSCOPY     TONSILLECTOMY      There were no vitals filed for this visit.   Subjective Assessment - 08/23/20 1307     Subjective Patient reports ongoing bottom pain- Daughter to call primary and request order for w/c "roho" cushion.    Patient is accompained by: Family member    Pertinent History Patient is a 75 y.o. male who presents to outpatient physical therapy with a referral for medical diagnosis of CVA. This patient's chief complaints consist of left hemiplegia and overall deconditioning leading to the following functional deficits: dependent for ADLs, IADLs, unable to transfer to car, dependent transfers at home with hoyer lift, unable to walk or stand without significant assistance, difficulty with W/C navigation..    Limitations Sitting;Lifting;Standing;House hold activities;Writing;Walking;Other (comment)    Diagnostic tests Brain MRI 11/01/2017: IMPRESSION: 1. Acute right ACA territory infarct as demonstrated on prior  CT imaging. No intracranial hemorrhage or significant mass effect. 2. 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    Patient Stated Goals wants to be able to walk again    Currently in Pain? Yes    Pain Score --   Did not rate   Pain Location Buttocks    Pain Orientation Posterior    Pain Descriptors / Indicators Aching;Sore    Pain Type Acute pain    Pain Onset 1 to 4 weeks ago    Pain Frequency Intermittent    Aggravating Factors  prolonged sitting    Pain Relieving Factors Pressure distribution    Effect of Pain on Daily Activities Difficulty with maintaining comfortable position    Multiple Pain Sites No    Pain Onset --               INTERVENTIONS      Sitting Therapeutic activity: Sitting postural exercises= 5 min of static sitting with vared levels of assist to maintain posture- mostly min - mod A  provided by PT and max VC and TC to sit upright without listing to the left.  Therapeutic Exercises:  AAROM marching and LAQ 2 x 10 Left LE each exercise; sets separated by seated rest break with focus on posture and concentrating on activity.  AROM March/LAQ on right LE- 2 x 10 reps.  Scap retraction using GTB (tied around left wrist and patient gripping on the right side)  2 x 10 reps with max TC/VC on left UE.    Attempted standing x 4 trials from seat of power chair (elevated surface) - Times varied from 35 sec to just over 60 sec-VC, Feedback of mirror, TC and max assist of PT to STS and to static stand today- Patient able to extend knees and hips today with above feedback.   Clinical Impression: Patient presents with improved ability to stand and maintain standing today versus several recent visits. He was unable to achieve improved knee and hip ext today with max VC/TC. PT recommended a ROHO cushion and reviewed images and information on particular cushion and discussed need for MD order. Patient daughter to contact PCP to attempt to receive order. He will continue to benefit from skilled PT services to improve his functional strength and postural control for improved transfers/standing                          PT Education - 08/23/20 1356     Education Details Pressure distribution; Ed on positioning and need for w/c cushion    Person(s) Educated Patient;Child(ren)    Methods Explanation;Demonstration;Tactile cues;Verbal cues    Comprehension Verbal cues required;Verbalized understanding;Returned demonstration;Tactile cues required;Need further instruction              PT Short Term Goals - 07/14/20 3536       PT SHORT TERM GOAL #1   Title Be independent with initial home exercise program for self-management  of symptoms.    Baseline HEP to be given at second session; advice to practice unsupported sitting at home (06/19/2018); 07/08/18: Performing at home, 6/29: needs assistance but is doing exercise program regularly; 08/22/18: adherent;    Time 2    Period Weeks    Status Achieved    Target Date 06/26/18      PT SHORT TERM GOAL #2   Title Patient will perform sit to stand transfer with mod Assist in parallel bars for improved  transfer ability with decreased caregiver assist.    Baseline 04/19/2020- Max assist of 1 person. 07/14/2020- Patient continues to require Max assist for sit to stand transfers    Time 6    Period Weeks    Status On-going    Target Date 08/25/20               PT Long Term Goals - 07/21/20 0723       PT LONG TERM GOAL #1   Title Patient will complete all bed mobility with min A to improve functional independence for getting in and out of bed and adjusting in bed.     Baseline max A - total A reported by family (06/19/2018); 07/08/18: still requires maxA, dtr reports improvement in rolling since starting therapy; 6/23: min A to supervision in clinic; not doing at home right; 8/5: Pt's daughter states that his rolling has improved, but still has difficulty with all other bed mobility; 10/23/18: pts daughter states that she is doing about 35% of the work if he is in proper position, he does uses the bed rails to help roll;  using Harrel Lemon for coming to sit EOB; 10/12: pts daughter states that she is doing about 30% of the work if he is in proper position, he does uses the bed rails to help roll, but it is improving;  using Harrel Lemon for coming to sit EOB, 10/29: min A for sit to sidelying, rolling supervision, able to transition sidelying to sit with HHA; 12/21: min-mod A. 03/01/2020- Patient presents improving bed mobility with current ability to roll to left with CGA and verbal and tactile cues and min assist to roll to right as seen by recent visits. 03/31/2020- Patient and caregiver  report that he has maintained ability to roll side to side with only minimal assistance.    Time 12    Period Weeks    Status Achieved      PT LONG TERM GOAL #2   Title Patient will complete sit <> stand transfer chair to chair with Min A and LRAD to improve functional independence for household and community mobility.     Baseline min A for STS, still requires elevated surface height to 27".  03/01/20- Patient currently unable to perform a chair to chair transfer and continues to be transferred using hoyer lift. Patient has been training with sit to stand at parallel bars and able to currently perform sit to stand with max assist  from 27in. high surface.03/31/2020- Patient remains at [facility] assist with sit to stand transfer and continued use of hoyer lift transfer for all chair to chair or bed to chair transfers. 04/19/2020- Patient requires Max assist with sit to stand transfers and requires hoyer for chair to chair transfers. 07/14/2020- Patient has continued to require max assist since Feb for sit to stand transfers- Do not anticipate that this is a realistic goal at this time.    Time 12    Period Weeks    Status Not Met    Target Date --   Goal no longer appropriate.     PT LONG TERM GOAL #3   Title Patient will navigate power w/c with min A x 100 feet to improve mobility for household and short community distances.    Baseline required total A (06/12/2018); able to roll 20 feet with min A (06/20/2018); 07/08/18: Pt can go approximately 10' without assist per family;  10/23/2018: pt's daughter states that his power WC mobility has become 100% better  with the new hand control, slowed speed, and with practice; 10/12: pt's daughter reports that he can perform household WC mobility and limited community ambulation with minA    Time 12    Period Weeks    Status Achieved      PT LONG TERM GOAL #4   Title Patient will complete car transfers W/C to family SUV with min A using LRAD to improve his abilty to  participate in community activities.     Baseline unable to complete car transfers at all (06/12/2018); unable to complete car transfers at all (06/19/2018).; 07/08/18: Unable to perform at this time; 09/11/18: unable to perform at this time; 10/23/2018: unable to complete car transfers at all; 10/12:  unable to complete car transfers at all, 10/29: unable; 01/13/19: unable; 2/11: min-mod A +2, 3/10: min A+1, 12/21: mod A. 03/01/2020- Patient unable to complete car transfers and continues to require medical transportation at this time. 3/14- Patient reports he may be purchasing a conversion van later this week.07/14/2020- Patient did not purchase the conversion Lucianne Lei and continues to require medical transport for all outings using his wheelchair.    Time 12    Period Weeks    Status Deferred    Target Date 10/06/20      PT LONG TERM GOAL #5   Title Patient will ambulate at least 10 feet with min A using LRAD to improve mobility for household and community distances.    Baseline unable to take steps (06/12/2018); able to shift R leg forward and back with max A and RW at edge of plinth (06/19/2018); 07/08/18: unable to ambulate at this time. 09/11/18: unable to perform at this time; 10/23/2018: unable to perform at this time; 10/12: unable to perform at this time, 10/29: unable at this time; 01/13/19: unable at this time, 2/11: min A to step with RLE, unable to step with LLE, 3/10: able to take 2 steps in parallel bars with min A for safety; 4/1: min A for walking 10 feet in parallel bars, 5/24: min A walking 10 feet in parallel bars; 6/21: no change 7/14: no change. 03/01/2020- Patient unable to ambulate and currently focusing on static standing. He was able to take a forward step with his right LE last week but unable today. 03/31/2020- Patient continues to be non-ambulatory but has been able to move right LE forward with standing but unable to take a step with left LE. 04/19/2020- Goal is still not appropriate as of yet- cont to  focus on static and dynamic standing balance/pregait activities. 07/14/2020- Goal still not appropriate- working on ability to dynamically weight shift for transfers and continuing with static transfers.    Time 12    Period Weeks    Status Deferred    Target Date 10/06/20      Additional Long Term Goals   Additional Long Term Goals Yes      PT LONG TERM GOAL #6   Title Patient will be able to safety navigate up/down ramp with power chair, exhibiting good safety awareness, mod I to safely enter/exit his home.    Baseline 2/11: supervision with intermittent min A;4/1: supervision; 5/24: supervision. 03/01/2020- patient reports that he is able to navigate down the ramp at home to leave the home but continues to need assist to navigate up the ramp into the home. 07/14/2020- Patient reports able to negotiate w/c in/out of home.    Time 12    Period Weeks    Status Achieved  PT LONG TERM GOAL #7   Title Patient will increase functional reach test to >15 inches in sitting to exhibit improved sitting balance and positioning and reduce fall risk;    Baseline 07/30/18: 12 inches; 09/11/18: 10-12 inches; 10/23/18: 12-13 inch; 10/12: 14.5", 10/29: 15.5 inch    Time 4    Period Weeks    Status Achieved      PT LONG TERM GOAL #8   Title Patient will increase LLE quad strength to 3+/5 to improve functional strength for standing and mobility;    Baseline 10/28: 2+/5; 01/13/19: 2+/5, 2/11: 3-/5, 04/16/19: 3-/5, 05/08/19: 3-/5, 5/24: 3-/5, 6/21: 3-/5, 7/14: 3-/5, 12/21: 3-/5. 03/01/2020- Left quad= 3-/5 (focusing on quad strengthening during visits and with home program). 03/31/2020- left quad = 3-/5 with MMT. 04/19/2020= 3- /5 with left quad strength with manual muscle testing. 07/14/2020- Patient presents with 3-/5 and quickly fatigues with knee ext yet functionall able to stand of left LE without knee buckling- yet unable to terminally extend left knee with static standing.    Time 12    Period Weeks    Status  On-going    Target Date 10/06/20      PT LONG TERM GOAL  #9   TITLE Patient will report no vertigo with provoking motions or positions in order to be able to perform ADLs and mobility tasks without symptoms.    Baseline 06/30/19: reports no dizziness in last week; 6/21: one episode this weekend; 7/14: experiencing one a week; 03/01/2020.- Patient has denied any dizziness for over past 3 weeks.    Time 12    Period Weeks    Status Achieved      PT LONG TERM GOAL  #10   TITLE Patient will demonstrate improved functional LE/Postural strength as seen by ability to perform static standing for 2 min or greater with max assist  to assist with strength required for ADL's and transfers/pregait/gait activities.    Baseline 03/01/2020- Patient able to stand approx 40 sec today with max assist in parallel bars. 03/31/2020- Patient able to demo static stand with max A for 2mn 47 sec today. 04/19/2020- Patient able to demo 2 min today with mod/max assist - yet remains inconsistent. 07/14/2020- Did not attempt today but during past visits during this cert- patient has been inconsistent standing for 30 sec to 2 min. Will keep goal active to strive for more consistency to help with strength for transfers.    Time 12    Period Weeks    Status On-going    Target Date 10/06/20      PT LONG TERM GOAL  #11   TITLE Patient will demonstrate improved static sitting- able to sit unsupported at back for 5 min without evidence of listing to left for improved sitting and postural control to prevent excessive pressure through left hip/arm with sitting.    Baseline 07/14/2020- Patient presents with progressive listing to left side over past month increasing risk for skin breakdown, pain in low back, and long term postural abnormalites.    Time 12    Period Weeks    Status New    Target Date 10/06/20                   Plan - 08/23/20 1357     Clinical Impression Statement Patient presents with improved ability to  stand and maintain standing today versus several recent visits. He was unable to achieve improved knee and hip ext today with max VC/TC.  PT recommended a ROHO cushion and reviewed images and information on particular cushion and discussed need for MD order. Patient daughter to contact PCP to attempt to receive order. He will continue to benefit from skilled PT services to improve his functional strength and postural control for improved transfers/standing     ?    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    Examination-Activity Limitations Bed Mobility;Bend;Sit;Toileting;Stand;Stairs;Lift;Transfers;Squat;Locomotion Level;Carry;Dressing;Hygiene/Grooming;Continence    Examination-Participation Restrictions Yard Work;Interpersonal Relationship;Community Activity    Stability/Clinical Decision Making Evolving/Moderate complexity    Rehab Potential Fair    PT Frequency 2x / week    PT Duration 12 weeks    PT Treatment/Interventions ADLs/Self Care Home Management;Electrical Stimulation;Moist Heat;Cryotherapy;Gait training;Stair training;Functional mobility training;Therapeutic exercise;Balance training;Neuromuscular re-education;Cognitive remediation;Patient/family education;Orthotic Fit/Training;Wheelchair mobility training;Manual techniques;Passive range of motion;Energy conservation;Joint Manipulations;Canalith Repostioning;Therapeutic activities;Vestibular    PT Next  Visit Plan Continue with progressive postural/LE strengthening/pregait activities    PT Home Exercise Plan no changes    Consulted and Agree with Plan of Care Patient;Family member/caregiver             Patient will benefit from skilled therapeutic intervention in order to improve the following deficits and impairments:  Abnormal gait, Decreased activity tolerance, Decreased cognition, Decreased endurance, Decreased knowledge of use of DME, Decreased range of motion, Decreased skin integrity, Decreased strength, Impaired perceived functional ability, Impaired sensation, Impaired UE functional use, Improper body mechanics, Pain, Cardiopulmonary status limiting activity, Decreased balance, Decreased coordination, Decreased mobility, Difficulty walking, Impaired tone, Postural dysfunction, Dizziness  Visit Diagnosis: Abnormality of gait and mobility  Difficulty in walking, not elsewhere classified  Muscle weakness (generalized)  Other lack of coordination     Problem List Patient Active Problem List   Diagnosis Date Noted   Sepsis ([place]) 10/18/2018   Long term (current) use of anticoagulants 05/01/2018   History of stroke 04/11/2018   Hyperlipidemia 07/04/2017   Chronic diarrhea 06/12/2017   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 [place], [place], [address] Phone:  [phone]   Fax:  [phone]  Name: Kenneth Summers MRN: [id number] Date of Birth: [birth year]

## 2020-08-25 ENCOUNTER — Other Ambulatory Visit: Payer: Self-pay

## 2020-08-25 ENCOUNTER — Ambulatory Visit: Payer: Medicare Other

## 2020-08-25 ENCOUNTER — Ambulatory Visit: Payer: Medicare Other | Admitting: Occupational Therapy

## 2020-08-25 ENCOUNTER — Encounter: Payer: Self-pay | Admitting: Occupational Therapy

## 2020-08-25 DIAGNOSIS — M6281 Muscle weakness (generalized): Secondary | ICD-10-CM

## 2020-08-25 DIAGNOSIS — R262 Difficulty in walking, not elsewhere classified: Secondary | ICD-10-CM

## 2020-08-25 DIAGNOSIS — R278 Other lack of coordination: Secondary | ICD-10-CM

## 2020-08-25 DIAGNOSIS — R269 Unspecified abnormalities of gait and mobility: Secondary | ICD-10-CM | POA: Diagnosis not present

## 2020-08-25 NOTE — Therapy (Signed)
[place] MAIN [facility] SERVICES [address] [place], [place], [address] Phone: [phone]   Fax:  [phone]  Occupational Therapy Treatment  Patient Details  Name: Kenneth Summers MRN: [id number] Date of Birth: Apr 14, 1945 No data recorded  Encounter Date: 08/25/2020   OT End of Session - 08/25/20 1443     Visit Number [id number]    Number of Visits 12    Date for OT Re-Evaluation 11/08/20    Authorization Type Progress report period starting 08/23/2020    OT Start Time 1345    OT Stop Time 1430    OT Time Calculation (min) 45 min    Activity Tolerance Patient tolerated treatment well    Behavior During Therapy WFL for tasks assessed/performed             Past Medical History:  Diagnosis Date   Actinic keratosis    Alcohol abuse    APS (antiphospholipid syndrome) (HCC)    Basal cell carcinoma    BPH (benign prostatic hyperplasia)    CAD (coronary artery disease)    Cellulitis    Cervical spinal stenosis    Chronic osteomyelitis (HCC)    CKD (chronic kidney disease)    CKD (chronic kidney disease)    Clostridium difficile diarrhea    Depression    Diplopia    Fatigue    GERD (gastroesophageal reflux disease)    Hyperlipemia    Hypertension    Hypovitaminosis D    IBS (irritable bowel syndrome)    IgA deficiency (HCC)    Insomnia    Left lumbosacral radiculopathy    Moderate obstructive sleep apnea    Osteomyelitis of foot (HCC)    PAD (peripheral artery disease) (HCC)    Pruritus    RA (rheumatoid arthritis) (HCC)    Radiculopathy    Restless legs syndrome    RLS (restless legs syndrome)    SLE (systemic lupus erythematosus related syndrome) ([place])    Squamous cell carcinoma of skin 01/27/2019   right mid lower ear helix   Stroke ([place])    Toxic maculopathy of both eyes     Past Surgical History:  Procedure Laterality Date   CARDIAC CATHETERIZATION     CAROTID ENDARTERECTOMY     CERVICAL LAMINECTOMY      CORONARY ANGIOPLASTY     FRACTURE SURGERY     fusion C5-6-7     HERNIA REPAIR     KNEE ARTHROSCOPY     TONSILLECTOMY      There were no vitals filed for this visit.   Subjective Assessment - 08/25/20 1442     Subjective  Pt. reports feeling better today.    Patient is accompanied by: Family member    Pertinent History Pt. is a 75 y.o. male who presents to the clinic with a CVA, with Left Hemiplegia on 11/01/2017. Pt. PMHx includes: Multiple Falls, Lupus, DJD, Renal Abscess, CVA. Pt. resides with his wife. Pt.'s wife and daughter assist with ADLs. Pt. has caregivers in  for 2 hours a day, 6 days a week. Pt. received Rehab services in acute care, at SNF for STR, and [place] services. Pt. is retired from [employer] for [employer] and [employer].    Currently in Pain? Yes            OT Treatment   Therapeutic Exercise:   Pt. worked on BUE strengthening, and reciprocal motion using the UBE while seated for 8 min. with no resistance.  Pt. worked  on 3# dumbbell ex. for bilateral elbow flexion and extension, and right supination/pronation,  and 2# for the left wrist extension.    Neuromuscular re-education:   Pt. worked on using his left hand for grasping coins from a tabletop surface, placing them into a resistive container, and pushing them through the slot while isolating his 2nd digit. Pt. worked on moving the coins to the edge of a flat surface isolating his 2nd digit to the thumb to form the grasp in preparation for placing them into a resistive container. Pt. worked with the container placed at the tabletop followed by placed at a vertical angle.  Pt. reports that his personal home health care aid had to have surgery unexpectedly. Pt. continues to make steady progress with BUE strength, and tolerance for there. ex. Pt. required fewer rest breaks throughout the session. Pt. required less verbal cues, and cues for visual demonstration for movement patterns, however required cues for  visual demonstration. Pt. continues to require consistent verbal, visual, and tactile cues. Pt. was able to maintain upright sitting in midline with a towel roll. Pt. had difficulty when trying to insert coins into the resistive container with it elevated at a vertical angle. Pt. continues to require cues to perform UE strengthening exercises, technique, and form as well as visual demonstration prior to the exercises. Pt. continues to work on improving BUE strength, and [facility] skills in order to improve LUE functioning during ADLs, IADL tasks.                          OT Education - 08/25/20 1442     Education Details strengthening, UE functioning    Person(s) Educated Patient;Caregiver(s)    Methods Explanation;Demonstration;Handout    Comprehension Returned demonstration;Tactile cues required;Verbalized understanding;Need further instruction                 OT Long Term Goals - 08/23/20 1415       OT LONG TERM GOAL #1   Title Pt. will increase left shoulder flexion AROM by 10 degrees to access cabinet/shelf.    Baseline Left shoulder flexion 119 degrees. Pt. is able to reach into cabinets with more ease, however has difficulty getting close enough to them with his w/c.Pt. continues to present with limited left shoulder ROM limitng access to cabinets, and shelves.03/01/2020: Left shoulder 116 degrees with new onset of numbness radiating from the left side of the neck to the hand. Pt. continues to progress with left shoulder ROM .Shouler flexion 130, and continues to work on reaching with increasing emphasis on flexing trunk to improve functional reach. 200th visit: shoulder flexion to 130 with effort    Time 12    Period Weeks    Status On-going    Target Date 11/08/20      OT LONG TERM GOAL #2   Title Pt. will donn a shirt with Supervision.    Baseline ModA. Pt. is assisting more with UE dressing at home. Pt. continues to require modA donning a zip down jacket with  consistent cues to initiate the correct direction of the jacket.  Min-modA mahaging the zipper. MinA for donning a T-shirt with assist to roll side to side to pull the shirt down over his back. 200th visit: continues to require min assist at times for shirt/jacket    Period Weeks    Status On-going    Target Date 11/08/20      OT LONG TERM GOAL #4  Title Pt. will improve left grip strength by 10# to be able to open a jar/container.    Baseline Grip strength continues to be limited. Decreased left sided grip strength today secondary to a new onset of numbenss, and weakness radiating from the neck to the left hand. 200th visit: Pt. continues to have a harder time opening a wide mouthed jar, however is able to open a smaller jar easier.    Time 12    Period Weeks    Status On-going    Target Date 11/08/20      OT LONG TERM GOAL #6   Title Pt. will demonstrate visual conmpensatory strategies for 100% of the time during ADLs    Baseline Improving, however Pt. continues to require intermittent cues for left sides awareness.    Time 12    Period Weeks    Status On-going    Target Date 02/09/20      OT LONG TERM GOAL #8   Title Pt. will accurately identify potential safety hazard using good safety awareness, and judgement 100% for ADLs, and IADLs.    Baseline Pt. continues to require cues    Time 12    Period Weeks    Status On-going    Target Date 11/08/20      OT LONG TERM GOAL  #9   TITLE Pt. will  navigate his w/c around obstacles with Supervision and 100% accuracy    Baseline Pt. has progressed, however, continues to require cuing for obstacles on the left with fewer  cues overall. Pt. requires significant cues to engage his left UE during tasks.. 200th visit: continues to bump into walls at home with the w/ct at times    Time 12    Period Weeks    Status On-going    Target Date 11/08/20      OT LONG TERM GOAL  #10   TITLE Pt. will independently be able  to reach to place items  into cabinetry, and closets.    Baseline Pt. continues to have difficulty with functional reaching continues to be limited.    Time 12    Period Weeks    Status On-going    Target Date 11/08/20      OT LONG TERM GOAL  #11   TITLE Pt. will increase BUE strength by 81mm grades in preparation for ADL transfers.    Baseline Pt. is progressing, however BUE strength continues to be limited. Pt. is able to tolerate increased resistive weights.    Time 12    Period Weeks    Status On-going    Target Date 11/08/20      OT LONG TERM GOAL  #12   TITLE Pt. will improve LUE functional reaching with the left engaging the trunk 100% of the time during ADL tasks with minimal cues.    Baseline Pt. is limited with LUE functional reaching.    Time 12    Period Weeks    Status On-going    Target Date 11/08/20                   Plan - 08/25/20 1443     Clinical Impression Statement Pt. reports that his personal home health care aid had to have surgery unexpectedly. Pt. continues to make steady progress with BUE strength, and tolerance for there. ex. Pt. required fewer rest breaks throughout the session. Pt. required less verbal cues, and cues for visual demonstration for movement patterns, however required cues for visual  demonstration. Pt. continues to require consistent verbal, visual, and tactile cues. Pt. was able to maintain upright sitting in midline with a towel roll. Pt. had difficulty when trying to insert coins into the resistive container with it elevated at a vertical angle. Pt. continues to require cues to perform UE strengthening exercises, technique, and form as well as visual demonstration prior to the exercises. Pt. continues to work on improving BUE strength, and [facility] skills in order to improve LUE functioning during ADLs, IADL tasks.             OT Occupational Profile and History Problem Focused Assessment - Including review of records relating to presenting problem     Occupational performance deficits (Please refer to evaluation for details): ADL's    Body Structure / Function / Physical Skills UE functional use;Coordination;FMC;Dexterity;Strength;ROM    Cognitive Skills Attention;Memory;Emotional;Problem Solve;Safety Awareness    Rehab Potential Good    Clinical Decision Making Several treatment options, min-mod task modification necessary    Comorbidities Affecting Occupational Performance: Presence of comorbidities impacting occupational performance    Comorbidities impacting occupational performance description: Phyical, cognitive, visual,  medical comorbidities    Modification or Assistance to Complete Evaluation  No modification of tasks or assist necessary to complete eval    OT Frequency 2x / week    OT Duration 12 weeks    OT Treatment/Interventions Self-care/ADL training;DME and/or AE instruction;Therapeutic exercise;Therapeutic activities;Moist Heat;Cognitive remediation/compensation;Neuromuscular education;Visual/perceptual remediation/compensation;Coping strategies training;Patient/family education;Passive range of motion;Psychosocial skills training;Energy conservation;Functional Mobility Training    Consulted and Agree with Plan of Care Patient;Family member/caregiver             Patient will benefit from skilled therapeutic intervention in order to improve the following deficits and impairments:   Body Structure / Function / Physical Skills: UE functional use, Coordination, FMC, Dexterity, Strength, ROM Cognitive Skills: Attention, Memory, Emotional, Problem Solve, Safety Awareness     Visit Diagnosis: Muscle weakness (generalized)  Other lack of coordination    Problem List Patient Active Problem List   Diagnosis Date Noted   Sepsis ([place]) 10/18/2018   Long term (current) use of anticoagulants 05/01/2018   History of stroke 04/11/2018   Hyperlipidemia 07/04/2017   Chronic diarrhea 06/12/2017   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 ([place]) 04/27/2016   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   Fatigue 02/06/2015   Visual changes 06/19/2013   Benign prostatic hyperplasia 01/07/2013   Depression 01/07/2013   Restless legs syndrome 01/07/2013   Chronic kidney disease, stage 3 unspecified ([place]) 12/20/2011   CAD (coronary artery disease) 11/03/2010   Hypertension 11/03/2010   PAD (peripheral artery disease) ([place]) 11/03/2010   SLE (systemic lupus erythematosus) ([place]) 11/03/2010    Harrel Carina, MS, OTR/L 08/25/2020, 2:47 PM  [place] MAIN [facility] SERVICES [place], [place], [address] Phone: [phone]   Fax:  [phone]  Name: TRAVIOUS VANOVER MRN: [id number] Date of Birth: 1945-12-22

## 2020-08-25 NOTE — Therapy (Signed)
[place] MAIN [facility] SERVICES [address] [place], [place], [address] Phone: [phone]   Fax:  [phone]  Physical Therapy Treatment/Physical Therapy Progress Note   Dates of reporting period  07/19/2020   to  08/25/2020  Patient Details  Name: Kenneth Summers MRN: [id number] Date of Birth: 04-Mar-1945 No data recorded  Encounter Date: 08/25/2020   PT End of Session - 08/25/20 1410     Visit Number 260    Number of Visits 301    Date for PT Re-Evaluation 10/06/20    Authorization Type Medicare reporting period starting 09/11/18; NO FOTO    Authorization Time Period Recert 3/54/6568-02/08/7515; Recert 0/0/1749-4/49/6759    PT Start Time 1308    PT Stop Time 1340    PT Time Calculation (min) 32 min    Equipment Utilized During Treatment Gait belt    Activity Tolerance No increased pain;Patient limited by lethargy;Patient limited by fatigue    Behavior During Therapy [facility] for tasks assessed/performed             Past Medical History:  Diagnosis Date   Actinic keratosis    Alcohol abuse    APS (antiphospholipid syndrome) (HCC)    Basal cell carcinoma    BPH (benign prostatic hyperplasia)    CAD (coronary artery disease)    Cellulitis    Cervical spinal stenosis    Chronic osteomyelitis (HCC)    CKD (chronic kidney disease)    CKD (chronic kidney disease)    Clostridium difficile diarrhea    Depression    Diplopia    Fatigue    GERD (gastroesophageal reflux disease)    Hyperlipemia    Hypertension    Hypovitaminosis D    IBS (irritable bowel syndrome)    IgA deficiency (HCC)    Insomnia    Left lumbosacral radiculopathy    Moderate obstructive sleep apnea    Osteomyelitis of foot (HCC)    PAD (peripheral artery disease) (HCC)    Pruritus    RA (rheumatoid arthritis) (HCC)    Radiculopathy    Restless legs syndrome    RLS (restless legs syndrome)    SLE (systemic lupus erythematosus related syndrome) ([place])     Squamous cell carcinoma of skin 01/27/2019   right mid lower ear helix   Stroke ([place])    Toxic maculopathy of both eyes     Past Surgical History:  Procedure Laterality Date   CARDIAC CATHETERIZATION     CAROTID ENDARTERECTOMY     CERVICAL LAMINECTOMY     CORONARY ANGIOPLASTY     FRACTURE SURGERY     fusion C5-6-7     HERNIA REPAIR     KNEE ARTHROSCOPY     TONSILLECTOMY      There were no vitals filed for this visit.   Subjective Assessment - 08/25/20 1407     Subjective Patient reports doing about the same. [name] daughter [name] reports she was able to obtain order for [facility] cushion and going through [employer] to obtain.    Patient is accompained by: Family member    Pertinent History Patient is a 75 y.o. male who presents to outpatient physical therapy with a referral for medical diagnosis of CVA. This patient's chief complaints consist of left hemiplegia and overall deconditioning leading to the following functional deficits: dependent for ADLs, IADLs, unable to transfer to car, dependent transfers at home with hoyer lift, unable to walk or stand without significant assistance, difficulty with W/C navigation.Marland Kitchen  Limitations Sitting;Lifting;Standing;House hold activities;Writing;Walking;Other (comment)    Diagnostic tests Brain MRI 11/01/2017: IMPRESSION: 1. Acute right ACA territory infarct as demonstrated on prior CT imaging. No intracranial hemorrhage or significant mass effect. 2. 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    Patient Stated Goals wants to be able to walk again    Currently in Pain? Yes    Pain Score --   Patient did not rate "Buttock" pain   Pain Location Buttocks    Pain Orientation Posterior    Pain Descriptors / Indicators Aching;Sore    Pain Type Acute pain    Pain Onset 1 to 4 weeks ago    Pain Frequency Constant    Aggravating Factors  prolonged sitting    Pain Relieving Factors Pressure relieving positions     Effect of Pain on Daily Activities Difficulty maintaining comfortable position.    Multiple Pain Sites No              Assessed Several Long term goals today- See goal section for specifics.   Assessed Transfer- Max Assist +1 to perform SPT from power w/c to edge of mat. Patient was able to stand 3/4 erect and able to pivot well - able to move right LE and turn well without pain.   Mat mobility- Patient was able to scoot minimally with CGA yet limited today secondary to known pressure sore on buttocks.   Static sitting x approx 15 min- Focusing on trunk control and patient did list to left side requiring physical assist to correct intermittently.   Sit to stand from edge of mat- Max Assist +1 (VC and TC for specific technique including hand/feet placement). Patient able to stand on 1st attempt and stand yet unable to extend hips or knees well- limited to around 40 sec today during testing.  Pt educated throughout session about proper posture and technique with exercises. Improved exercise technique, movement at target joints, use of target muscles after min to mod verbal, visual, tactile cues.    Clinical Impression: Patient continues to trend back to improving in transfer ability after a few weeks of increased difficulty. He only required max A +1 instead of +2 today and able to sit at edge of mat for much longer duration and less overall physical assist. He continues to present with inconsistent ability with static standing.  Patient was able to demo improving upright posture versus past several session with less overall assistance. [name] condition has the potential to improve in response to therapy. Maximum improvement is yet to be obtained. The anticipated improvement is attainable and reasonable in a generally predictable time.                          PT Education - 08/25/20 1410     Education Details Transfer   ed and technique for safe mobility    Person(s)  Educated Patient    Methods Explanation;Demonstration;Tactile cues;Verbal cues    Comprehension Verbalized understanding;Returned demonstration;Verbal cues required;Tactile cues required;Need further instruction              PT Short Term Goals - 07/14/20 0721       PT SHORT TERM GOAL #1   Title Be independent with initial home exercise program for self-management of symptoms.    Baseline HEP to be given at second session; advice to practice unsupported sitting at home (06/19/2018); 07/08/18: Performing at home, 6/29: needs assistance but is doing exercise   program regularly; 08/22/18: adherent;    Time 2    Period Weeks    Status Achieved    Target Date 06/26/18      PT SHORT TERM GOAL #2   Title Patient will perform sit to stand transfer with mod Assist in parallel bars for improved transfer ability with decreased caregiver assist.    Baseline 04/19/2020- Max assist of 1 person. 07/14/2020- Patient continues to require Max assist for sit to stand transfers    Time 6    Period Weeks    Status On-going    Target Date 08/25/20               PT Long Term Goals - 08/25/20 1412       PT LONG TERM GOAL #1   Title Patient will complete all bed mobility with min A to improve functional independence for getting in and out of bed and adjusting in bed.     Baseline max A - total A reported by family (06/19/2018); 07/08/18: still requires maxA, dtr reports improvement in rolling since starting therapy; 6/23: min A to supervision in clinic; not doing at home right; 8/5: Pt's daughter states that his rolling has improved, but still has difficulty with all other bed mobility; 10/23/18: pts daughter states that she is doing about 35% of the work if he is in proper position, he does uses the bed rails to help roll;  using Hoyer for coming to sit EOB; 10/12: pts daughter states that she is doing about 30% of the work if he is in proper position, he does uses the bed rails to help roll, but it is  improving;  using Hoyer for coming to sit EOB, 10/29: min A for sit to sidelying, rolling supervision, able to transition sidelying to sit with HHA; 12/21: min-mod A. 03/01/2020- Patient presents improving bed mobility with current ability to roll to left with CGA and verbal and tactile cues and min assist to roll to right as seen by recent visits. 03/31/2020- Patient and caregiver report that he has maintained ability to roll side to side with only minimal assistance.    Time 12    Period Weeks    Status Achieved      PT LONG TERM GOAL #2   Title Patient will complete sit <> stand transfer chair to chair with Min A and LRAD to improve functional independence for household and community mobility.     Baseline min A for STS, still requires elevated surface height to 27".  03/01/20- Patient currently unable to perform a chair to chair transfer and continues to be transferred using hoyer lift. Patient has been training with sit to stand at parallel bars and able to currently perform sit to stand with max assist  from 27in. high surface.03/31/2020- Patient remains at Max assist with sit to stand transfer and continued use of hoyer lift transfer for all chair to chair or bed to chair transfers. 04/19/2020- Patient requires Max assist with sit to stand transfers and requires hoyer for chair to chair transfers. 07/14/2020- Patient has continued to require max assist since Feb for sit to stand transfers- Do not anticipate that this is a realistic goal at this time.    Time 12    Period Weeks    Status Not Met      PT LONG TERM GOAL #3   Title Patient will navigate power w/c with min A x 100 feet to improve mobility for household and short community   distances.    Baseline required total A (06/12/2018); able to roll 20 feet with min A (06/20/2018); 07/08/18: Pt can go approximately 10' without assist per family;  10/23/2018: pt's daughter states that his power WC mobility has become 100% better with the new hand control,  slowed speed, and with practice; 10/12: pt's daughter reports that he can perform household WC mobility and limited community ambulation with minA    Time 12    Period Weeks    Status Achieved      PT LONG TERM GOAL #4   Title Patient will complete car transfers W/C to family SUV with min A using LRAD to improve his abilty to participate in community activities.     Baseline unable to complete car transfers at all (06/12/2018); unable to complete car transfers at all (06/19/2018).; 07/08/18: Unable to perform at this time; 09/11/18: unable to perform at this time; 10/23/2018: unable to complete car transfers at all; 10/12:  unable to complete car transfers at all, 10/29: unable; 01/13/19: unable; 2/11: min-mod A +2, 3/10: min A+1, 12/21: mod A. 03/01/2020- Patient unable to complete car transfers and continues to require medical transportation at this time. 3/14- Patient reports he may be purchasing a conversion van later this week.07/14/2020- Patient did not purchase the conversion Lucianne Lei and continues to require medical transport for all outings using his wheelchair.    Time 12    Period Weeks    Status Not Met   Goal not currently appropriate. Patient is dependent on community transportation service.     PT LONG TERM GOAL #5   Title Patient will ambulate at least 10 feet with min A using LRAD to improve mobility for household and community distances.    Baseline unable to take steps (06/12/2018); able to shift R leg forward and back with max A and RW at edge of plinth (06/19/2018); 07/08/18: unable to ambulate at this time. 09/11/18: unable to perform at this time; 10/23/2018: unable to perform at this time; 10/12: unable to perform at this time, 10/29: unable at this time; 01/13/19: unable at this time, 2/11: min A to step with RLE, unable to step with LLE, 3/10: able to take 2 steps in parallel bars with min A for safety; 4/1: min A for walking 10 feet in parallel bars, 5/24: min A walking 10 feet in parallel bars;  6/21: no change 7/14: no change. 03/01/2020- Patient unable to ambulate and currently focusing on static standing. He was able to take a forward step with his right LE last week but unable today. 03/31/2020- Patient continues to be non-ambulatory but has been able to move right LE forward with standing but unable to take a step with left LE. 04/19/2020- Goal is still not appropriate as of yet- cont to focus on static and dynamic standing balance/pregait activities. 07/14/2020- Goal still not appropriate- working on ability to dynamically weight shift for transfers and continuing with static transfers. 08/25/2020-Goal still not appropriate- working on ability to dynamically weight shift for transfers and continuing with static transfers    Time 12    Period Weeks    Status Deferred    Target Date 10/06/20      PT LONG TERM GOAL #6   Title Patient will be able to safety navigate up/down ramp with power chair, exhibiting good safety awareness, mod I to safely enter/exit his home.    Baseline 2/11: supervision with intermittent min A;4/1: supervision; 5/24: supervision. 03/01/2020- patient reports that he is able  to navigate down the ramp at home to leave the home but continues to need assist to navigate up the ramp into the home. 07/14/2020- Patient reports able to negotiate w/c in/out of home.    Time 12    Period Weeks    Status Achieved      PT LONG TERM GOAL #7   Title Patient will increase functional reach test to >15 inches in sitting to exhibit improved sitting balance and positioning and reduce fall risk;    Baseline 07/30/18: 12 inches; 09/11/18: 10-12 inches; 10/23/18: 12-13 inch; 10/12: 14.5", 10/29: 15.5 inch    Time 4    Period Weeks    Status Achieved      PT LONG TERM GOAL #8   Title Patient will increase LLE quad strength to 3+/5 to improve functional strength for standing and mobility;    Baseline 10/28: 2+/5; 01/13/19: 2+/5, 2/11: 3-/5, 04/16/19: 3-/5, 05/08/19: 3-/5, 5/24: 3-/5, 6/21: 3-/5,  7/14: 3-/5, 12/21: 3-/5. 03/01/2020- Left quad= 3-/5 (focusing on quad strengthening during visits and with home program). 03/31/2020- left quad = 3-/5 with MMT. 04/19/2020= 3- /5 with left quad strength with manual muscle testing. 07/14/2020- Patient presents with 3-/5 and quickly fatigues with knee ext yet functionall able to stand of left LE without knee buckling- yet unable to terminally extend left knee with static standing. 08/25/2020- Patient presents with 2-/5 Hip flex/abd/knee ext/flex- More difficulty eliciting contraction at this time. He presents with 3+/5 with right Hip flex/abd, Knee ext/flex.    Time 12    Period Weeks    Status On-going    Target Date 10/06/20      PT LONG TERM GOAL  #9   TITLE Patient will report no vertigo with provoking motions or positions in order to be able to perform ADLs and mobility tasks without symptoms.    Baseline 06/30/19: reports no dizziness in last week; 6/21: one episode this weekend; 7/14: experiencing one a week; 03/01/2020.- Patient has denied any dizziness for over past 3 weeks.    Time 12    Period Weeks    Status Achieved      PT LONG TERM GOAL  #10   TITLE Patient will demonstrate improved functional LE/Postural strength as seen by ability to perform static standing for 2 min or greater with max assist  to assist with strength required for ADL's and transfers/pregait/gait activities.    Baseline 03/01/2020- Patient able to stand approx 40 sec today with max assist in parallel bars. 03/31/2020- Patient able to demo static stand with max A for 1min 47 sec today. 04/19/2020- Patient able to demo 2 min today with mod/max assist - yet remains inconsistent. 07/14/2020- Did not attempt today but during past visits during this cert- patient has been inconsistent standing for 30 sec to 2 min. Will keep goal active to strive for more consistency to help with strength for transfers. 08/25/2020- Patient with difficulty with standing and remains inconsistent with  ongoing Left sided LE muscle weakness as limiting factor. He requires increased physical assist (max) to remain in standing and poor hip extension.    Time 12    Period Weeks    Status On-going      PT LONG TERM GOAL  #11   TITLE Patient will demonstrate improved static sitting- able to sit unsupported at back for 5 min without evidence of listing to left for improved sitting and postural control to prevent excessive pressure through left hip/arm with sitting.      Baseline 07/14/2020- Patient presents with progressive listing to left side over past month increasing risk for skin breakdown, pain in low back, and long term postural abnormalites.08/25/2020- Patient was able to demo 15 min of sitting today at edge of mat yet required intermittent physical assist to keep from listing to left side and to look ahead and bow his head. He was having difficulty staying on tasks yet able to self correct at times and others requiring Min-mod Physical assist.    Time 12    Period Weeks    Status On-going                   Plan - 08/25/20 1411     Clinical Impression Statement Patient continues to trend back to improving in transfer ability after a few weeks of increased difficulty. He only required max A +1 instead of +2 today and able to sit at edge of mat for much longer duration and less overall physical assist. He continues to present with inconsistent ability with static standing.  Patient was able to demo improving upright posture versus past several session with less overall assistance. [name] condition has the potential to improve in response to therapy. Maximum improvement is yet to be obtained. The anticipated improvement is attainable and reasonable in a generally predictable time.    Personal Factors and Comorbidities Age;Comorbidity 3+    Comorbidities Relevant past medical history and comorbidities include long term steroid use for lupus, CKD, chronic osteomyelitis, cervical spine  stenosis, BPH, APS, alcohol abuse, peripheral artery disease, depression, GERD, obstructive sleep apnea, HTN, IBS, IgA deficiency, lumbar radiculopathy, RA, Stroke, toxic maculopathy in both eyes, systemic lupus erythematosus related syndrome, cardiac catheterization, carotid endarterectomy, cervical laminectomy, coronary angioplasty, Fusion C5-C7, knee arthroplasty, lumbar surgery.    Examination-Activity Limitations Bed Mobility;Bend;Sit;Toileting;Stand;Stairs;Lift;Transfers;Squat;Locomotion Level;Carry;Dressing;Hygiene/Grooming;Continence    Examination-Participation Restrictions Yard Work;Interpersonal Relationship;Community Activity    Stability/Clinical Decision Making Evolving/Moderate complexity    Rehab Potential Fair    PT Frequency 2x / week    PT Duration 12 weeks    PT Treatment/Interventions ADLs/Self Care Home Management;Electrical Stimulation;Moist Heat;Cryotherapy;Gait training;Stair training;Functional mobility training;Therapeutic exercise;Balance training;Neuromuscular re-education;Cognitive remediation;Patient/family education;Orthotic Fit/Training;Wheelchair mobility training;Manual techniques;Passive range of motion;Energy conservation;Joint Manipulations;Canalith Repostioning;Therapeutic activities;Vestibular    PT Next Visit Plan Continue with progressive postural/LE strengthening/pregait activities    PT Home Exercise Plan no changes    Consulted and Agree with Plan of Care Patient;Family member/caregiver             Patient will benefit from skilled therapeutic intervention in order to improve the following deficits and impairments:  Abnormal gait, Decreased activity tolerance, Decreased cognition, Decreased endurance, Decreased knowledge of use of DME, Decreased range of motion, Decreased skin integrity, Decreased strength, Impaired perceived functional ability, Impaired sensation, Impaired UE functional use, Improper body mechanics, Pain, Cardiopulmonary status limiting  activity, Decreased balance, Decreased coordination, Decreased mobility, Difficulty walking, Impaired tone, Postural dysfunction, Dizziness  Visit Diagnosis: Abnormality of gait and mobility  Difficulty in walking, not elsewhere classified  Muscle weakness (generalized)     Problem List Patient Active Problem List   Diagnosis Date Noted   Sepsis ([place]) 10/18/2018   Long term (current) use of anticoagulants 05/01/2018   History of stroke 04/11/2018   Hyperlipidemia 07/04/2017   Chronic diarrhea 06/12/2017   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 ([place]) 04/27/2016   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   Fatigue 02/06/2015   Visual changes 06/19/2013   Benign prostatic hyperplasia 01/07/2013  Depression 01/07/2013   Restless legs syndrome 01/07/2013   Chronic kidney disease, stage 3 unspecified (HCC) 12/20/2011   CAD (coronary artery disease) 11/03/2010   Hypertension 11/03/2010   PAD (peripheral artery disease) (HCC) 11/03/2010   SLE (systemic lupus erythematosus) (HCC) 11/03/2010     N , PT 08/25/2020, 2:57 PM  [facility] SERVICES [address] [place], [place], [address] Phone: [phone]   Fax:  [phone]  Name: Kenneth Summers MRN: [id number] Date of Birth: 01/22/1946    

## 2020-08-30 ENCOUNTER — Ambulatory Visit: Payer: Medicare Other

## 2020-08-30 ENCOUNTER — Other Ambulatory Visit: Payer: Self-pay

## 2020-08-30 ENCOUNTER — Ambulatory Visit: Payer: Medicare Other | Admitting: Occupational Therapy

## 2020-08-30 DIAGNOSIS — M6281 Muscle weakness (generalized): Secondary | ICD-10-CM

## 2020-08-30 DIAGNOSIS — R2681 Unsteadiness on feet: Secondary | ICD-10-CM

## 2020-08-30 DIAGNOSIS — R262 Difficulty in walking, not elsewhere classified: Secondary | ICD-10-CM

## 2020-08-30 DIAGNOSIS — R269 Unspecified abnormalities of gait and mobility: Secondary | ICD-10-CM | POA: Diagnosis not present

## 2020-08-30 NOTE — Therapy (Signed)
[place] MAIN [facility] SERVICES [address] [place], [place], [address] Phone: [phone]   Fax:  [phone]  Patient Details  Name: Kenneth Summers MRN: [id number] Date of Birth: 1945-12-15 Referring Provider:  Valera Castle, *  Encounter Date: 08/30/2020  Pt. arrived for the session today, however presented with LUE weakness, and  intermittent confusion.Pt. reports that his left side felt weaker when trying to get out of bed upon waking up this morning. Pt. reports that he was weaker in PT today. Pt.'s BP 111/62 with HR 62 bpms. Pt.'s daughter plans to follow-up with his physician. OT treatment session was withheld today.   Harrel Carina, MS, OTR/L 08/30/2020, 7:12 PM  [place] MAIN [facility] SERVICES [address] [place], [place], [address] Phone: [phone]   Fax:  [phone]

## 2020-08-30 NOTE — Therapy (Signed)
[place] MAIN [facility] [address] [place], [place], [address] Phone: [phone]   Fax:  [phone]  Physical Therapy Treatment  Patient Details  Name: Kenneth Summers MRN: [id number] Date of Birth: 1945-12-05 No data recorded  Encounter Date: 08/30/2020   PT End of Session - 08/30/20 1259     Visit Number 261    Number of Visits 301    Date for PT Re-Evaluation 10/06/20    Authorization Type Medicare reporting period starting 09/11/18; NO FOTO    Authorization Time Period Recert 06/09/5463-07/14/1273; Recert 02/13/15-4/94/4967    PT Start Time 1258    PT Stop Time 1340    PT Time Calculation (min) 42 min    Equipment Utilized During Treatment Gait belt    Activity Tolerance No increased pain;Patient limited by lethargy;Patient limited by fatigue    Behavior During Therapy [facility] for tasks assessed/performed             Past Medical History:  Diagnosis Date   Actinic keratosis    Alcohol abuse    APS (antiphospholipid syndrome) (HCC)    Basal cell carcinoma    BPH (benign prostatic hyperplasia)    CAD (coronary artery disease)    Cellulitis    Cervical spinal stenosis    Chronic osteomyelitis (HCC)    CKD (chronic kidney disease)    CKD (chronic kidney disease)    Clostridium difficile diarrhea    Depression    Diplopia    Fatigue    GERD (gastroesophageal reflux disease)    Hyperlipemia    Hypertension    Hypovitaminosis D    IBS (irritable bowel syndrome)    IgA deficiency (HCC)    Insomnia    Left lumbosacral radiculopathy    Moderate obstructive sleep apnea    Osteomyelitis of foot (HCC)    PAD (peripheral artery disease) (HCC)    Pruritus    RA (rheumatoid arthritis) (HCC)    Radiculopathy    Restless legs syndrome    RLS (restless legs syndrome)    SLE (systemic lupus erythematosus related syndrome) ([place])    Squamous cell carcinoma of skin 01/27/2019   right mid lower ear helix   Stroke ([place])     Toxic maculopathy of both eyes     Past Surgical History:  Procedure Laterality Date   CARDIAC CATHETERIZATION     CAROTID ENDARTERECTOMY     CERVICAL LAMINECTOMY     CORONARY ANGIOPLASTY     FRACTURE SURGERY     fusion C5-6-7     HERNIA REPAIR     KNEE ARTHROSCOPY     TONSILLECTOMY      There were no vitals filed for this visit.   Subjective Assessment - 08/30/20 1257     Subjective Patient reports he has a GI appointment next Monday    Patient is accompained by: Family member    Pertinent History Patient is a 75 y.o. male who presents to outpatient physical therapy with a referral for medical diagnosis of CVA. This patient's chief complaints consist of left hemiplegia and overall deconditioning leading to the following functional deficits: dependent for ADLs, IADLs, unable to transfer to car, dependent transfers at home with hoyer lift, unable to walk or stand without significant assistance, difficulty with W/C navigation..    Limitations Sitting;Lifting;Standing;House hold activities;Writing;Walking;Other (comment)    Diagnostic tests Brain MRI 11/01/2017: IMPRESSION: 1. Acute right ACA territory infarct as demonstrated on prior CT imaging. No intracranial hemorrhage or significant  mass effect. 2. 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    Patient Stated Goals wants to be able to walk again    Currently in Pain? No/denies    Pain Onset 1 to 4 weeks ago             Interventions:   Therapeutic exercises:   LAQ B AAROM 2 sets of 10 reps Seated hip march AAROM - 2 sets of 10 reps Seated Hip add 1 sets of 10 reps with 3 sec hold  Seated UE shoulder flex AROM  x 15 reps Chest press/scap row- AROM x 15 reps each  Therapeutic activiites:  Sit to stand- x 3 trials- Max A all 3 trials with cues to lean forward.   Static standing: Patient able to stand 4 trials with Max assist - cues for posture and for hand placement. Patient struggled  to achieve any functional hip ext and unable to maintain great than 5 sec at a time. Patient required constant VC/TC to achieve and maintain standing and limited by fatigue.  Education provided throughout session via VC/TC and demonstration to facilitate movement at target joints and correct muscle activation for all exercises performed.   Clinical impression: Patient required move cueing for Left UE activation and presents as more dependent with transfers and standing today- unable to achieve functional standing- requiring max assist throughout. He will continue to benefit from skilled PT services to improve his functional strength and postural control for improved transfers/standing                           PT Education - 08/30/20 1258     Education Details Safety ed with transfers/standing    Person(s) Educated Patient    Methods Explanation    Comprehension Verbalized understanding;Returned demonstration;Verbal cues required;Tactile cues required;Need further instruction              PT Short Term Goals - 07/14/20 5465       PT SHORT TERM GOAL #1   Title Be independent with initial home exercise program for self-management of symptoms.    Baseline HEP to be given at second session; advice to practice unsupported sitting at home (06/19/2018); 07/08/18: Performing at home, 6/29: needs assistance but is doing exercise program regularly; 08/22/18: adherent;    Time 2    Period Weeks    Status Achieved    Target Date 06/26/18      PT SHORT TERM GOAL #2   Title Patient will perform sit to stand transfer with mod Assist in parallel bars for improved transfer ability with decreased caregiver assist.    Baseline 04/19/2020- Max assist of 1 person. 07/14/2020- Patient continues to require Max assist for sit to stand transfers    Time 6    Period Weeks    Status On-going    Target Date 08/25/20               PT Long Term Goals - 08/25/20 1412       PT LONG  TERM GOAL #1   Title Patient will complete all bed mobility with min A to improve functional independence for getting in and out of bed and adjusting in bed.     Baseline max A - total A reported by family (06/19/2018); 07/08/18: still requires maxA, dtr reports improvement in rolling since starting therapy; 6/23: min A to supervision in clinic; not doing at home right; 8/5:  Pt's daughter states that his rolling has improved, but still has difficulty with all other bed mobility; 10/23/18: pts daughter states that she is doing about 35% of the work if he is in proper position, he does uses the bed rails to help roll;  using Harrel Lemon for coming to sit EOB; 10/12: pts daughter states that she is doing about 30% of the work if he is in proper position, he does uses the bed rails to help roll, but it is improving;  using Harrel Lemon for coming to sit EOB, 10/29: min A for sit to sidelying, rolling supervision, able to transition sidelying to sit with HHA; 12/21: min-mod A. 03/01/2020- Patient presents improving bed mobility with current ability to roll to left with CGA and verbal and tactile cues and min assist to roll to right as seen by recent visits. 03/31/2020- Patient and caregiver report that he has maintained ability to roll side to side with only minimal assistance.    Time 12    Period Weeks    Status Achieved      PT LONG TERM GOAL #2   Title Patient will complete sit <> stand transfer chair to chair with Min A and LRAD to improve functional independence for household and community mobility.     Baseline min A for STS, still requires elevated surface height to 27".  03/01/20- Patient currently unable to perform a chair to chair transfer and continues to be transferred using hoyer lift. Patient has been training with sit to stand at parallel bars and able to currently perform sit to stand with max assist  from 27in. high surface.03/31/2020- Patient remains at [facility] assist with sit to stand transfer and continued use of  hoyer lift transfer for all chair to chair or bed to chair transfers. 04/19/2020- Patient requires Max assist with sit to stand transfers and requires hoyer for chair to chair transfers. 07/14/2020- Patient has continued to require max assist since Feb for sit to stand transfers- Do not anticipate that this is a realistic goal at this time.    Time 12    Period Weeks    Status Not Met      PT LONG TERM GOAL #3   Title Patient will navigate power w/c with min A x 100 feet to improve mobility for household and short community distances.    Baseline required total A (06/12/2018); able to roll 20 feet with min A (06/20/2018); 07/08/18: Pt can go approximately 10' without assist per family;  10/23/2018: pt's daughter states that his power WC mobility has become 100% better with the new hand control, slowed speed, and with practice; 10/12: pt's daughter reports that he can perform household WC mobility and limited community ambulation with minA    Time 12    Period Weeks    Status Achieved      PT LONG TERM GOAL #4   Title Patient will complete car transfers W/C to family SUV with min A using LRAD to improve his abilty to participate in community activities.     Baseline unable to complete car transfers at all (06/12/2018); unable to complete car transfers at all (06/19/2018).; 07/08/18: Unable to perform at this time; 09/11/18: unable to perform at this time; 10/23/2018: unable to complete car transfers at all; 10/12:  unable to complete car transfers at all, 10/29: unable; 01/13/19: unable; 2/11: min-mod A +2, 3/10: min A+1, 12/21: mod A. 03/01/2020- Patient unable to complete car transfers and continues to require medical transportation at  this time. 3/14- Patient reports he may be purchasing a conversion van later this week.07/14/2020- Patient did not purchase the conversion Lucianne Lei and continues to require medical transport for all outings using his wheelchair.    Time 12    Period Weeks    Status Not Met   Goal not  currently appropriate. Patient is dependent on community transportation service.     PT LONG TERM GOAL #5   Title Patient will ambulate at least 10 feet with min A using LRAD to improve mobility for household and community distances.    Baseline unable to take steps (06/12/2018); able to shift R leg forward and back with max A and RW at edge of plinth (06/19/2018); 07/08/18: unable to ambulate at this time. 09/11/18: unable to perform at this time; 10/23/2018: unable to perform at this time; 10/12: unable to perform at this time, 10/29: unable at this time; 01/13/19: unable at this time, 2/11: min A to step with RLE, unable to step with LLE, 3/10: able to take 2 steps in parallel bars with min A for safety; 4/1: min A for walking 10 feet in parallel bars, 5/24: min A walking 10 feet in parallel bars; 6/21: no change 7/14: no change. 03/01/2020- Patient unable to ambulate and currently focusing on static standing. He was able to take a forward step with his right LE last week but unable today. 03/31/2020- Patient continues to be non-ambulatory but has been able to move right LE forward with standing but unable to take a step with left LE. 04/19/2020- Goal is still not appropriate as of yet- cont to focus on static and dynamic standing balance/pregait activities. 07/14/2020- Goal still not appropriate- working on ability to dynamically weight shift for transfers and continuing with static transfers. 08/25/2020-Goal still not appropriate- working on ability to dynamically weight shift for transfers and continuing with static transfers    Time 12    Period Weeks    Status Deferred    Target Date 10/06/20      PT LONG TERM GOAL #6   Title Patient will be able to safety navigate up/down ramp with power chair, exhibiting good safety awareness, mod I to safely enter/exit his home.    Baseline 2/11: supervision with intermittent min A;4/1: supervision; 5/24: supervision. 03/01/2020- patient reports that he is able to navigate  down the ramp at home to leave the home but continues to need assist to navigate up the ramp into the home. 07/14/2020- Patient reports able to negotiate w/c in/out of home.    Time 12    Period Weeks    Status Achieved      PT LONG TERM GOAL #7   Title Patient will increase functional reach test to >15 inches in sitting to exhibit improved sitting balance and positioning and reduce fall risk;    Baseline 07/30/18: 12 inches; 09/11/18: 10-12 inches; 10/23/18: 12-13 inch; 10/12: 14.5", 10/29: 15.5 inch    Time 4    Period Weeks    Status Achieved      PT LONG TERM GOAL #8   Title Patient will increase LLE quad strength to 3+/5 to improve functional strength for standing and mobility;    Baseline 10/28: 2+/5; 01/13/19: 2+/5, 2/11: 3-/5, 04/16/19: 3-/5, 05/08/19: 3-/5, 5/24: 3-/5, 6/21: 3-/5, 7/14: 3-/5, 12/21: 3-/5. 03/01/2020- Left quad= 3-/5 (focusing on quad strengthening during visits and with home program). 03/31/2020- left quad = 3-/5 with MMT. 04/19/2020= 3- /5 with left quad strength with manual muscle testing.  07/14/2020- Patient presents with 3-/5 and quickly fatigues with knee ext yet functionall able to stand of left LE without knee buckling- yet unable to terminally extend left knee with static standing. 08/25/2020- Patient presents with 2-/5 Hip flex/abd/knee ext/flex- More difficulty eliciting contraction at this time. He presents with 3+/5 with right Hip flex/abd, Knee ext/flex.    Time 12    Period Weeks    Status On-going    Target Date 10/06/20      PT LONG TERM GOAL  #9   TITLE Patient will report no vertigo with provoking motions or positions in order to be able to perform ADLs and mobility tasks without symptoms.    Baseline 06/30/19: reports no dizziness in last week; 6/21: one episode this weekend; 7/14: experiencing one a week; 03/01/2020.- Patient has denied any dizziness for over past 3 weeks.    Time 12    Period Weeks    Status Achieved      PT LONG TERM GOAL  #10   TITLE  Patient will demonstrate improved functional LE/Postural strength as seen by ability to perform static standing for 2 min or greater with max assist  to assist with strength required for ADL's and transfers/pregait/gait activities.    Baseline 03/01/2020- Patient able to stand approx 40 sec today with max assist in parallel bars. 03/31/2020- Patient able to demo static stand with max A for 59mn 47 sec today. 04/19/2020- Patient able to demo 2 min today with mod/max assist - yet remains inconsistent. 07/14/2020- Did not attempt today but during past visits during this cert- patient has been inconsistent standing for 30 sec to 2 min. Will keep goal active to strive for more consistency to help with strength for transfers. 08/25/2020- Patient with difficulty with standing and remains inconsistent with ongoing Left sided LE muscle weakness as limiting factor. He requires increased physical assist (max) to remain in standing and poor hip extension.    Time 12    Period Weeks    Status On-going      PT LONG TERM GOAL  #11   TITLE Patient will demonstrate improved static sitting- able to sit unsupported at back for 5 min without evidence of listing to left for improved sitting and postural control to prevent excessive pressure through left hip/arm with sitting.    Baseline 07/14/2020- Patient presents with progressive listing to left side over past month increasing risk for skin breakdown, pain in low back, and long term postural abnormalites.08/25/2020- Patient was able to demo 15 min of sitting today at edge of mat yet required intermittent physical assist to keep from listing to left side and to look ahead and bow his head. He was having difficulty staying on tasks yet able to self correct at times and others requiring Min-mod Physical assist.    Time 12    Period Weeks    Status On-going                   Plan - 08/31/20 1654     Clinical Impression Statement Patient required move cueing for Left UE  activation and presents as more dependent with transfers and standing today- unable to achieve functional standing- requiring max assist throughout. He will continue to benefit from skilled PT services to improve his functional strength and postural control for improved transfers/standing    Personal Factors and Comorbidities Age;Comorbidity 3+    Comorbidities Relevant past medical history and comorbidities include long term steroid use for lupus, CKD, chronic  osteomyelitis, cervical spine stenosis, BPH, APS, alcohol abuse, peripheral artery disease, depression, GERD, obstructive sleep apnea, HTN, IBS, IgA deficiency, lumbar radiculopathy, RA, Stroke, toxic maculopathy in both eyes, systemic lupus erythematosus related syndrome, cardiac catheterization, carotid endarterectomy, cervical laminectomy, coronary angioplasty, Fusion C5-C7, knee arthroplasty, lumbar surgery.    Examination-Activity Limitations Bed Mobility;Bend;Sit;Toileting;Stand;Stairs;Lift;Transfers;Squat;Locomotion Level;Carry;Dressing;Hygiene/Grooming;Continence    Examination-Participation Restrictions Yard Work;Interpersonal Relationship;Community Activity    Stability/Clinical Decision Making Evolving/Moderate complexity    Clinical Decision Making Moderate    Rehab Potential Fair    PT Frequency 2x / week    PT Duration 12 weeks    PT Treatment/Interventions ADLs/Self Care Home Management;Electrical Stimulation;Moist Heat;Cryotherapy;Gait training;Stair training;Functional mobility training;Therapeutic exercise;Balance training;Neuromuscular re-education;Cognitive remediation;Patient/family education;Orthotic Fit/Training;Wheelchair mobility training;Manual techniques;Passive range of motion;Energy conservation;Joint Manipulations;Canalith Repostioning;Therapeutic activities;Vestibular    PT Next Visit Plan Continue with progressive postural/LE strengthening/pregait activities    PT Home Exercise Plan no changes    Consulted and  Agree with Plan of Care Patient;Family member/caregiver    Family Member Consulted Dtr.  [name]             Patient will benefit from skilled therapeutic intervention in order to improve the following deficits and impairments:  Abnormal gait, Decreased activity tolerance, Decreased cognition, Decreased endurance, Decreased knowledge of use of DME, Decreased range of motion, Decreased skin integrity, Decreased strength, Impaired perceived functional ability, Impaired sensation, Impaired UE functional use, Improper body mechanics, Pain, Cardiopulmonary status limiting activity, Decreased balance, Decreased coordination, Decreased mobility, Difficulty walking, Impaired tone, Postural dysfunction, Dizziness  Visit Diagnosis: Difficulty in walking, not elsewhere classified  Muscle weakness (generalized)  Abnormality of gait and mobility  Unsteadiness on feet     Problem List Patient Active Problem List   Diagnosis Date Noted   Sepsis ([place]) 10/18/2018   Long term (current) use of anticoagulants 05/01/2018   History of stroke 04/11/2018   Hyperlipidemia 07/04/2017   Chronic diarrhea 06/12/2017   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 [place], [place], [address] Phone: [phone]   Fax:  [phone]  Name: Kenneth Summers MRN: [id number] Date of Birth:  Aug 08, 1945

## 2020-08-31 ENCOUNTER — Encounter: Payer: Medicare Other | Admitting: Occupational Therapy

## 2020-09-01 ENCOUNTER — Ambulatory Visit: Payer: Medicare Other

## 2020-09-01 ENCOUNTER — Ambulatory Visit: Payer: Medicare Other | Admitting: Occupational Therapy

## 2020-09-06 ENCOUNTER — Other Ambulatory Visit: Payer: Self-pay | Admitting: Gastroenterology

## 2020-09-06 ENCOUNTER — Ambulatory Visit: Payer: Medicare Other

## 2020-09-06 ENCOUNTER — Ambulatory Visit: Payer: Medicare Other | Admitting: Occupational Therapy

## 2020-09-06 ENCOUNTER — Encounter: Payer: Medicare Other | Admitting: Speech Pathology

## 2020-09-06 DIAGNOSIS — R1319 Other dysphagia: Secondary | ICD-10-CM

## 2020-09-07 DIAGNOSIS — M5417 Radiculopathy, lumbosacral region: Secondary | ICD-10-CM | POA: Insufficient documentation

## 2020-09-07 DIAGNOSIS — R32 Unspecified urinary incontinence: Secondary | ICD-10-CM | POA: Insufficient documentation

## 2020-09-08 ENCOUNTER — Ambulatory Visit: Payer: Medicare Other | Admitting: Occupational Therapy

## 2020-09-08 ENCOUNTER — Ambulatory Visit: Payer: Medicare Other | Admitting: Speech Pathology

## 2020-09-08 ENCOUNTER — Ambulatory Visit: Payer: Medicare Other

## 2020-09-10 ENCOUNTER — Other Ambulatory Visit: Payer: Medicare Other

## 2020-09-13 ENCOUNTER — Ambulatory Visit: Payer: Medicare Other

## 2020-09-13 ENCOUNTER — Other Ambulatory Visit: Payer: Medicare Other

## 2020-09-13 ENCOUNTER — Ambulatory Visit: Payer: Medicare Other | Admitting: Occupational Therapy

## 2020-09-15 ENCOUNTER — Ambulatory Visit: Payer: Medicare Other | Admitting: Occupational Therapy

## 2020-09-15 ENCOUNTER — Ambulatory Visit: Payer: Medicare Other

## 2020-09-16 ENCOUNTER — Encounter: Payer: Self-pay | Admitting: Occupational Therapy

## 2020-09-16 NOTE — Therapy (Signed)
[place] MAIN [facility] SERVICES [address] [place], [place], [address] Phone: [phone]   Fax:  [phone]  September 16, 2020    No Recipients  Occupational Therapy Discharge Summary   Patient: Kenneth Summers MRN: [id number] Date of Birth: April 05, 1945  Diagnosis: No diagnosis found.  No data recorded  The above patient had been seen in Occupational Therapy for 211 visits.   The treatment consisted of ADL training, A/E training, neuromuscular re-education, visual/perceptual compensatory strategies, cognitive compensatory strategies and pt. Education. The patient is: Improved  Subjective: Pt.'s daughter is very supportive, and accompanies him to most therapy sessions.  Functional Status at Discharge: Pt. has a new sacral wound, and has transitioned to home health nursing, and therapy services.     OT Long Term Goals - 09/16/20 3:27       OT LONG TERM GOAL #1   Title Pt. will increase left shoulder flexion AROM by 10 degrees to access cabinet/shelf.    Baseline Left shoulder flexion 119 degrees. Pt. is able to reach into cabinets with more ease, however has difficulty getting close enough to them with his w/c.Pt. continues to present with limited left shoulder ROM limitng access to cabinets, and shelves.03/01/2020: Left shoulder 116 degrees with new onset of numbness radiating from the left side of the neck to the hand. Pt. continues to progress with left shoulder ROM .Shouler flexion 130, and continues to work on reaching with increasing emphasis on flexing trunk to improve functional reach. 200th visit: shoulder flexion to 130 with effort    Time 12    Period Weeks    Status  Partially met   Target Date 11/08/20      OT LONG TERM GOAL #2   Title Pt. will donn a shirt with Supervision.    Baseline ModA. Pt. is assisting more with UE dressing at home. Pt. continues to require modA donning a zip down jacket with consistent cues to  initiate the correct direction of the jacket.  Min-modA mahaging the zipper. MinA for donning a T-shirt with assist to roll side to side to pull the shirt down over his back. 200th visit: continues to require min assist at times for shirt/jacket    Period Weeks    Status Not met   Target Date 11/08/20      OT LONG TERM GOAL #4   Title Pt. will improve left grip strength by 10# to be able to open a jar/container.    Baseline Grip strength continues to be limited. Decreased left sided grip strength today secondary to a new onset of numbenss, and weakness radiating from the neck to the left hand. 200th visit: Pt. continues to have a harder time opening a wide mouthed jar, however is able to open a smaller jar easier.    Time 12    Period Weeks    Status Partially met   Target Date 11/08/20      OT LONG TERM GOAL #6   Title Pt. will demonstrate visual conmpensatory strategies for 100% of the time during ADLs    Baseline Improving, however Pt. continues to require intermittent cues for left sides awareness.    Time 12    Period Weeks    Status On-going    Target Date 02/09/20      OT LONG TERM GOAL #8   Title Pt. will accurately identify potential safety hazard using good safety awareness, and judgement 100% for ADLs, and IADLs.  Baseline Pt. continues to require cues    Time 12    Period Weeks    Status On-going    Target Date 11/08/20      OT LONG TERM GOAL  #9   TITLE Pt. will  navigate his w/c around obstacles with Supervision and 100% accuracy    Baseline Pt. has progressed, however, continues to require cuing for obstacles on the left with fewer  cues overall. Pt. requires significant cues to engage his left UE during tasks.. 200th visit: continues to bump into walls at home with the w/ct at times    Time 12    Period Weeks    Status On-going    Target Date 11/08/20      OT LONG TERM GOAL  #10   TITLE Pt. will independently be able  to reach to place items into cabinetry, and  closets.    Baseline Pt. continues to have difficulty with functional reaching continues to be limited.    Time 12    Period Weeks    Status On-going    Target Date 11/08/20      OT LONG TERM GOAL  #11   TITLE Pt. will increase BUE strength by 32mm grades in preparation for ADL transfers.    Baseline Pt. is progressing, however BUE strength continues to be limited. Pt. is able to tolerate increased resistive weights.    Time 12    Period Weeks    Status On-going    Target Date 11/08/20      OT LONG TERM GOAL  #12   TITLE Pt. will improve LUE functional reaching with the left engaging the trunk 100% of the time during ADL tasks with minimal cues.    Baseline Pt. is limited with LUE functional reaching.    Time 12    Period Weeks    Status On-going    Target Date 11/08/20               Sincerely,  Harrel Carina, MS, OTR/L  CC No Recipients  [place] MAIN [facility] SERVICES [address] [place], [place], [address] Phone: [phone]   Fax:  [phone]  Patient: Kenneth Summers MRN: [id number] Date of Birth: 1945-10-16

## 2020-09-20 ENCOUNTER — Ambulatory Visit: Payer: Medicare Other | Admitting: Occupational Therapy

## 2020-09-20 ENCOUNTER — Other Ambulatory Visit: Payer: Self-pay

## 2020-09-20 ENCOUNTER — Other Ambulatory Visit: Payer: Medicare Other

## 2020-09-20 ENCOUNTER — Ambulatory Visit: Payer: Medicare Other

## 2020-09-20 ENCOUNTER — Ambulatory Visit
Admission: RE | Admit: 2020-09-20 | Discharge: 2020-09-20 | Disposition: A | Discharge status: Home / Self Care | Payer: Medicare Other | Source: Ambulatory Visit | Attending: Gastroenterology | Admitting: Gastroenterology

## 2020-09-20 DIAGNOSIS — R1319 Other dysphagia: Secondary | ICD-10-CM | POA: Diagnosis not present

## 2020-09-22 ENCOUNTER — Ambulatory Visit: Payer: Medicare Other

## 2020-09-22 ENCOUNTER — Ambulatory Visit: Payer: Medicare Other | Admitting: Occupational Therapy

## 2020-09-27 ENCOUNTER — Ambulatory Visit: Payer: Medicare Other | Admitting: Occupational Therapy

## 2020-09-27 ENCOUNTER — Ambulatory Visit: Payer: Medicare Other

## 2020-09-29 ENCOUNTER — Ambulatory Visit: Payer: Medicare Other | Admitting: Occupational Therapy

## 2020-09-29 ENCOUNTER — Ambulatory Visit: Payer: Medicare Other

## 2020-10-01 DIAGNOSIS — I69320 Aphasia following cerebral infarction: Secondary | ICD-10-CM | POA: Insufficient documentation

## 2020-10-04 ENCOUNTER — Ambulatory Visit: Payer: Medicare Other | Admitting: Occupational Therapy

## 2020-10-04 ENCOUNTER — Ambulatory Visit: Payer: Medicare Other

## 2020-10-06 ENCOUNTER — Ambulatory Visit: Payer: Medicare Other | Admitting: Occupational Therapy

## 2020-10-06 ENCOUNTER — Ambulatory Visit: Payer: Medicare Other

## 2020-10-13 ENCOUNTER — Ambulatory Visit: Payer: Medicare Other

## 2020-10-13 ENCOUNTER — Encounter: Payer: Medicare Other | Admitting: Occupational Therapy

## 2020-10-18 ENCOUNTER — Encounter: Payer: Medicare Other | Admitting: Occupational Therapy

## 2020-10-18 ENCOUNTER — Ambulatory Visit: Payer: Medicare Other

## 2020-10-20 ENCOUNTER — Encounter: Payer: Medicare Other | Admitting: Occupational Therapy

## 2020-10-20 ENCOUNTER — Ambulatory Visit: Payer: Medicare Other

## 2020-10-21 ENCOUNTER — Encounter: Payer: Self-pay | Admitting: Primary Care

## 2020-10-21 ENCOUNTER — Other Ambulatory Visit: Payer: Self-pay

## 2020-10-21 ENCOUNTER — Other Ambulatory Visit: Payer: Medicare Other | Admitting: Primary Care

## 2020-10-21 DIAGNOSIS — L24A2 Irritant contact dermatitis due to fecal, urinary or dual incontinence: Secondary | ICD-10-CM | POA: Insufficient documentation

## 2020-10-21 DIAGNOSIS — Z515 Encounter for palliative care: Secondary | ICD-10-CM

## 2020-10-21 DIAGNOSIS — K529 Noninfective gastroenteritis and colitis, unspecified: Secondary | ICD-10-CM

## 2020-10-21 DIAGNOSIS — M329 Systemic lupus erythematosus, unspecified: Secondary | ICD-10-CM

## 2020-10-21 DIAGNOSIS — L258 Unspecified contact dermatitis due to other agents: Secondary | ICD-10-CM

## 2020-10-21 DIAGNOSIS — Z8673 Personal history of transient ischemic attack (TIA), and cerebral infarction without residual deficits: Secondary | ICD-10-CM

## 2020-10-21 DIAGNOSIS — R32 Unspecified urinary incontinence: Secondary | ICD-10-CM

## 2020-10-21 NOTE — Progress Notes (Signed)
  [facility] Consult Note Telephone: [phone]  Fax: [phone]    Date of encounter: 10/21/20 10:52 AM PATIENT NAME: Kenneth Summers [address] [place] [address]   [phone] (home)  DOB: 12/23/1945 MRN: [id number] PRIMARY CARE PROVIDER:    Leach, Shannon, NP,  [address] [place] [address] [phone]  REFERRING PROVIDER:   Leach, Shannon, NP [address] [place],  [place] [address] [phone]  RESPONSIBLE PARTY:    Contact Information     Name Relation Home Work Mobile   Royal,Janet Daughter [phone]     Stuard,Sandra L Spouse [phone]         I met face to face with patient and family in home Palliative Care was asked to follow this patient by consultation request of  Leach, Shannon, NP to address advance care planning and complex medical decision making. This fact to face visit was made for the purpose of determining need for DME. This is a follow up visit.                                   ASSESSMENT AND PLAN / RECOMMENDATIONS:   Advance Care Planning/Goals of Care: Goals include to maximize quality of life and symptom management. Our advance care planning conversation included a discussion about:    The value and importance of advance care planning  Exploration of goals of care in the event of a sudden injury or illness  Review of an  advance directive document . CODE STATUS: DNR  Symptom Management/Plan:  Order for alternating pressure mattress for hospital bed. Has multiple open skin areas of Stage 2 wounds  on buttock due to pressure injury and incontinence. Medically necessary to stop progression of pressure injury and optimize healing potential of wounds.   Wt :248 lbs, Ht: 6'5" in epic record  Concerns for skin conditions and continuity of care continue per daughter. Daughter approached with concern that they are receiving home-based CNA care via [facility] but are  seeking home-based RN care for skin assessment and wound management. Daughter states she is not able to provide routine wound care and states that she is seeking a nurse or who can help in the home. Explained that a RN for chronic wound care was not a service reimbursed by insurances.   Immobility patient has lupus, history of CVA with left deficits, and long-standing immobility he currently utilizes a hoyer lift and power chair for transfers. He also has a hospital bed currently that has an air mattress, however it does not provide alternating option. He no longer sees outpatient PT but has been receiving in home PT and OT for 2 months. His goal is to be able to stand and pivot to transfer from bed to wheelchair. Currently he cannot stand. He is very limited in community interactions due transportation. He is dependent in all adls and iadls. Education RE repositioning in bed onto side.   Skin breakdown in diaper area:  He is incontinent of bowel and bladder and has developed a new related and recurrent skin breakdown. He experiences frequent yeast infections which is being treated with clotrimazole-betamethasone 1%/ 0.5% cream topically BID. Patient is seeing primary care provider for ongoing management. I also have recommended avoiding briefs while in bed, dermacloud, and ordered a alternating air mattress. Daughter reports great appetite and patient consumes ensures TID. Education provided to daughter regarding frequency   of incontinence care. Discussed with Shannon Leach, NP.  Recommend to use dermacloud to diaper area qid, stop use of dressings due to stool being trapped underneath.   Loose stools: has history of constipation. Family reports finding hard balance between constipation and diarrhea. Suspected cause of excoriation and moisture associated dermatitis. Family states they balance constipation and diarrhea with probiotics, immodium, and miralax.  Follow up Palliative Care Visit: Palliative care  will continue to follow for complex medical decision making, advance care planning, and clarification of goals. Return 4 weeks or prn.  I spent 60 minutes providing this consultation. More than 50% of the time in this consultation was spent in counseling and care coordination.  PPS: 30%  HOSPICE ELIGIBILITY/DIAGNOSIS: TBD  Chief Complaint: Skin Impairment, immobility.  HISTORY OF PRESENT ILLNESS:  Kenneth Summers is a 75 y.o. year old male with SLE, CVA, pain syndrome, recurrent yeast infection, CAD, RA. He is bed bound and transfers with use of a  power chair with hoyer lift. His main concern was impaired skin integrity of the buttock area and immobility.   History obtained from review of EMR, discussion with primary team, and interview with family, facility staff/caregiver and/or Mr. Calvert.  I reviewed available labs, medications, imaging, studies and related documents from the EMR.  Records reviewed and summarized above.  .   ROS  General: NAD EYES: denies vision changes ENMT: + dysphagia Cardiovascular: denies chest pain, denies DOE Pulmonary: denies cough, denies increased SOB Abdomen: endorses good appetite, + constipation, + incontinence of bowel GU: denies dysuria, + incontinence of urine MSK:  + weakness,  no falls reported Skin: endorses dermatitis, + buttock wound Neurological: denies pain, denies insomnia Psych: Endorses positive mood. Flat affect Heme/lymph/immuno: denies bruises, abnormal bleeding  Physical Exam: Constitutional: NAD General: WNWD  EYES: anicteric sclera, lids intact, no discharge  ENMT: intact hearing, oral mucous membranes moist, dentition intact CV: S1S2, RRR, +2LLE edema +1 RLE edema Pulmonary: no increased work of breathing, no cough, room air Abdomen: intake 100%,  soft and non tender, no ascites GU: scrotum excoriated from stool MSK: moderate sarcopenia, left upper and lower extremity neglect, moves all right extremities,  non-ambulatory Skin: warm and dry,+ buttock wounds with moisture associated dermatitis and yeast, skin tear to left forearm  Neuro:  generalized weakness,  severe cognitive impairment Psych: non-anxious affect, A and O x 2 Hem/lymph/immuno: no widespread bruising  Thank you for the opportunity to participate in the care of Mr. Thumm.  The palliative care team will continue to follow. Please call our office at [phone] if we can be of additional assistance.    [place] , NP   COVID-19 PATIENT SCREENING TOOL Asked and negative response unless otherwise noted:   Have you had symptoms of covid, tested positive or been in contact with someone with symptoms/positive test in the past 5-10 days?   

## 2020-10-25 ENCOUNTER — Encounter: Payer: Medicare Other | Admitting: Occupational Therapy

## 2020-10-25 ENCOUNTER — Ambulatory Visit: Payer: Medicare Other

## 2020-10-27 ENCOUNTER — Ambulatory Visit: Payer: Medicare Other

## 2020-10-27 ENCOUNTER — Encounter: Payer: Medicare Other | Admitting: Occupational Therapy

## 2020-10-29 ENCOUNTER — Telehealth: Payer: Self-pay

## 2020-10-29 NOTE — Telephone Encounter (Signed)
1236 pm.  Phone call made to daughter [name] to follow up on DME and derma cloud cream.  No answer but message has been left requesting a call back.

## 2020-11-01 ENCOUNTER — Ambulatory Visit: Payer: Medicare Other

## 2020-11-01 ENCOUNTER — Encounter: Payer: Medicare Other | Admitting: Occupational Therapy

## 2020-11-03 ENCOUNTER — Ambulatory Visit: Payer: Medicare Other

## 2020-11-03 ENCOUNTER — Encounter: Payer: Medicare Other | Admitting: Occupational Therapy

## 2020-11-08 ENCOUNTER — Encounter: Payer: Medicare Other | Admitting: Occupational Therapy

## 2020-11-08 ENCOUNTER — Ambulatory Visit: Payer: Medicare Other

## 2020-11-08 ENCOUNTER — Encounter: Payer: Medicare Other | Admitting: Speech Pathology

## 2020-11-10 ENCOUNTER — Encounter: Payer: Medicare Other | Admitting: Occupational Therapy

## 2020-11-10 ENCOUNTER — Ambulatory Visit: Payer: Medicare Other

## 2020-11-15 ENCOUNTER — Encounter: Payer: Medicare Other | Admitting: Speech Pathology

## 2020-11-15 ENCOUNTER — Encounter: Payer: Medicare Other | Admitting: Occupational Therapy

## 2020-11-15 ENCOUNTER — Ambulatory Visit: Payer: Medicare Other

## 2020-11-17 ENCOUNTER — Ambulatory Visit: Payer: Medicare Other

## 2020-11-17 ENCOUNTER — Encounter: Payer: Medicare Other | Admitting: Occupational Therapy

## 2020-11-18 ENCOUNTER — Other Ambulatory Visit: Payer: Medicare Other | Admitting: Primary Care

## 2020-11-22 ENCOUNTER — Encounter: Payer: Medicare Other | Admitting: Speech Pathology

## 2020-11-22 ENCOUNTER — Ambulatory Visit: Payer: Medicare Other

## 2020-11-22 ENCOUNTER — Encounter: Payer: Medicare Other | Admitting: Occupational Therapy

## 2020-11-24 ENCOUNTER — Ambulatory Visit: Payer: Medicare Other

## 2020-11-24 ENCOUNTER — Encounter: Payer: Medicare Other | Admitting: Occupational Therapy

## 2020-11-29 ENCOUNTER — Encounter: Payer: Medicare Other | Admitting: Occupational Therapy

## 2020-11-29 ENCOUNTER — Ambulatory Visit: Payer: Medicare Other

## 2020-11-29 ENCOUNTER — Encounter: Payer: Medicare Other | Admitting: Speech Pathology

## 2020-12-01 ENCOUNTER — Ambulatory Visit: Payer: Medicare Other

## 2020-12-01 ENCOUNTER — Encounter: Payer: Medicare Other | Admitting: Occupational Therapy

## 2020-12-02 ENCOUNTER — Other Ambulatory Visit: Payer: Medicare Other | Admitting: Primary Care

## 2020-12-06 ENCOUNTER — Encounter: Payer: Medicare Other | Admitting: Speech Pathology

## 2020-12-06 ENCOUNTER — Encounter: Payer: Medicare Other | Admitting: Occupational Therapy

## 2020-12-06 ENCOUNTER — Ambulatory Visit: Payer: Medicare Other

## 2020-12-08 ENCOUNTER — Encounter: Payer: Medicare Other | Admitting: Occupational Therapy

## 2020-12-08 ENCOUNTER — Ambulatory Visit: Payer: Medicare Other

## 2020-12-13 ENCOUNTER — Ambulatory Visit: Payer: Medicare Other

## 2020-12-13 ENCOUNTER — Encounter: Payer: Medicare Other | Admitting: Occupational Therapy

## 2020-12-13 ENCOUNTER — Encounter: Payer: Medicare Other | Admitting: Speech Pathology

## 2020-12-15 ENCOUNTER — Encounter: Payer: Medicare Other | Admitting: Occupational Therapy

## 2020-12-15 ENCOUNTER — Ambulatory Visit: Payer: Medicare Other

## 2020-12-20 ENCOUNTER — Encounter: Payer: Medicare Other | Admitting: Speech Pathology

## 2020-12-20 ENCOUNTER — Encounter: Payer: Medicare Other | Admitting: Occupational Therapy

## 2020-12-20 ENCOUNTER — Ambulatory Visit: Payer: Medicare Other

## 2020-12-22 ENCOUNTER — Ambulatory Visit: Payer: Medicare Other

## 2020-12-22 ENCOUNTER — Encounter: Payer: Medicare Other | Admitting: Occupational Therapy

## 2020-12-26 ENCOUNTER — Inpatient Hospital Stay
Admission: EM | Admit: 2020-12-26 | Discharge: 2020-12-30 | DRG: 872 | Disposition: A | Discharge status: Home Health | Payer: Medicare Other | Attending: Internal Medicine | Admitting: Internal Medicine

## 2020-12-26 ENCOUNTER — Emergency Department: Payer: Medicare Other

## 2020-12-26 ENCOUNTER — Encounter: Payer: Self-pay | Admitting: Emergency Medicine

## 2020-12-26 ENCOUNTER — Other Ambulatory Visit: Payer: Self-pay

## 2020-12-26 DIAGNOSIS — Z823 Family history of stroke: Secondary | ICD-10-CM

## 2020-12-26 DIAGNOSIS — L89611 Pressure ulcer of right heel, stage 1: Secondary | ICD-10-CM | POA: Diagnosis present

## 2020-12-26 DIAGNOSIS — G2581 Restless legs syndrome: Secondary | ICD-10-CM | POA: Diagnosis present

## 2020-12-26 DIAGNOSIS — E785 Hyperlipidemia, unspecified: Secondary | ICD-10-CM | POA: Diagnosis present

## 2020-12-26 DIAGNOSIS — M05772 Rheumatoid arthritis with rheumatoid factor of left ankle and foot without organ or systems involvement: Secondary | ICD-10-CM | POA: Diagnosis present

## 2020-12-26 DIAGNOSIS — I10 Essential (primary) hypertension: Secondary | ICD-10-CM | POA: Diagnosis not present

## 2020-12-26 DIAGNOSIS — D802 Selective deficiency of immunoglobulin A [IgA]: Secondary | ICD-10-CM | POA: Diagnosis present

## 2020-12-26 DIAGNOSIS — N1831 Chronic kidney disease, stage 3a: Secondary | ICD-10-CM | POA: Diagnosis present

## 2020-12-26 DIAGNOSIS — A419 Sepsis, unspecified organism: Secondary | ICD-10-CM | POA: Diagnosis present

## 2020-12-26 DIAGNOSIS — L89522 Pressure ulcer of left ankle, stage 2: Secondary | ICD-10-CM | POA: Diagnosis present

## 2020-12-26 DIAGNOSIS — G4733 Obstructive sleep apnea (adult) (pediatric): Secondary | ICD-10-CM | POA: Diagnosis present

## 2020-12-26 DIAGNOSIS — R001 Bradycardia, unspecified: Secondary | ICD-10-CM | POA: Diagnosis present

## 2020-12-26 DIAGNOSIS — R5383 Other fatigue: Secondary | ICD-10-CM | POA: Diagnosis not present

## 2020-12-26 DIAGNOSIS — Z7901 Long term (current) use of anticoagulants: Secondary | ICD-10-CM

## 2020-12-26 DIAGNOSIS — M329 Systemic lupus erythematosus, unspecified: Secondary | ICD-10-CM | POA: Diagnosis present

## 2020-12-26 DIAGNOSIS — Z888 Allergy status to other drugs, medicaments and biological substances status: Secondary | ICD-10-CM

## 2020-12-26 DIAGNOSIS — K219 Gastro-esophageal reflux disease without esophagitis: Secondary | ICD-10-CM | POA: Diagnosis present

## 2020-12-26 DIAGNOSIS — I129 Hypertensive chronic kidney disease with stage 1 through stage 4 chronic kidney disease, or unspecified chronic kidney disease: Secondary | ICD-10-CM | POA: Diagnosis present

## 2020-12-26 DIAGNOSIS — Z79899 Other long term (current) drug therapy: Secondary | ICD-10-CM

## 2020-12-26 DIAGNOSIS — G8929 Other chronic pain: Secondary | ICD-10-CM | POA: Diagnosis present

## 2020-12-26 DIAGNOSIS — Z85828 Personal history of other malignant neoplasm of skin: Secondary | ICD-10-CM

## 2020-12-26 DIAGNOSIS — Z7952 Long term (current) use of systemic steroids: Secondary | ICD-10-CM

## 2020-12-26 DIAGNOSIS — K529 Noninfective gastroenteritis and colitis, unspecified: Secondary | ICD-10-CM | POA: Diagnosis present

## 2020-12-26 DIAGNOSIS — N401 Enlarged prostate with lower urinary tract symptoms: Secondary | ICD-10-CM | POA: Diagnosis present

## 2020-12-26 DIAGNOSIS — Z7982 Long term (current) use of aspirin: Secondary | ICD-10-CM

## 2020-12-26 DIAGNOSIS — Z20822 Contact with and (suspected) exposure to covid-19: Secondary | ICD-10-CM | POA: Diagnosis present

## 2020-12-26 DIAGNOSIS — I69354 Hemiplegia and hemiparesis following cerebral infarction affecting left non-dominant side: Secondary | ICD-10-CM | POA: Diagnosis not present

## 2020-12-26 DIAGNOSIS — I2510 Atherosclerotic heart disease of native coronary artery without angina pectoris: Secondary | ICD-10-CM | POA: Diagnosis present

## 2020-12-26 DIAGNOSIS — D6861 Antiphospholipid syndrome: Secondary | ICD-10-CM | POA: Diagnosis present

## 2020-12-26 DIAGNOSIS — R6520 Severe sepsis without septic shock: Secondary | ICD-10-CM | POA: Diagnosis present

## 2020-12-26 DIAGNOSIS — R059 Cough, unspecified: Secondary | ICD-10-CM | POA: Diagnosis not present

## 2020-12-26 DIAGNOSIS — I739 Peripheral vascular disease, unspecified: Secondary | ICD-10-CM | POA: Diagnosis present

## 2020-12-26 DIAGNOSIS — G40909 Epilepsy, unspecified, not intractable, without status epilepticus: Secondary | ICD-10-CM | POA: Diagnosis present

## 2020-12-26 DIAGNOSIS — S90822A Blister (nonthermal), left foot, initial encounter: Secondary | ICD-10-CM | POA: Diagnosis present

## 2020-12-26 DIAGNOSIS — I639 Cerebral infarction, unspecified: Secondary | ICD-10-CM | POA: Diagnosis present

## 2020-12-26 DIAGNOSIS — R338 Other retention of urine: Secondary | ICD-10-CM | POA: Diagnosis present

## 2020-12-26 DIAGNOSIS — E872 Acidosis, unspecified: Secondary | ICD-10-CM | POA: Diagnosis present

## 2020-12-26 DIAGNOSIS — N179 Acute kidney failure, unspecified: Secondary | ICD-10-CM | POA: Diagnosis present

## 2020-12-26 DIAGNOSIS — Z981 Arthrodesis status: Secondary | ICD-10-CM

## 2020-12-26 DIAGNOSIS — Z7401 Bed confinement status: Secondary | ICD-10-CM

## 2020-12-26 DIAGNOSIS — Z87891 Personal history of nicotine dependence: Secondary | ICD-10-CM

## 2020-12-26 DIAGNOSIS — Z9861 Coronary angioplasty status: Secondary | ICD-10-CM

## 2020-12-26 DIAGNOSIS — Z8673 Personal history of transient ischemic attack (TIA), and cerebral infarction without residual deficits: Secondary | ICD-10-CM | POA: Diagnosis not present

## 2020-12-26 DIAGNOSIS — F32A Depression, unspecified: Secondary | ICD-10-CM | POA: Diagnosis present

## 2020-12-26 DIAGNOSIS — Z66 Do not resuscitate: Secondary | ICD-10-CM | POA: Diagnosis present

## 2020-12-26 DIAGNOSIS — S90821A Blister (nonthermal), right foot, initial encounter: Secondary | ICD-10-CM | POA: Diagnosis present

## 2020-12-26 DIAGNOSIS — L8990 Pressure ulcer of unspecified site, unspecified stage: Secondary | ICD-10-CM | POA: Insufficient documentation

## 2020-12-26 DIAGNOSIS — R197 Diarrhea, unspecified: Secondary | ICD-10-CM

## 2020-12-26 DIAGNOSIS — X58XXXA Exposure to other specified factors, initial encounter: Secondary | ICD-10-CM | POA: Diagnosis present

## 2020-12-26 LAB — LIPASE, BLOOD: Lipase: 31 U/L (ref 11–51)

## 2020-12-26 LAB — URINALYSIS, ROUTINE W REFLEX MICROSCOPIC
Bilirubin Urine: NEGATIVE
Glucose, UA: NEGATIVE mg/dL
Hgb urine dipstick: NEGATIVE
Ketones, ur: NEGATIVE mg/dL
Leukocytes,Ua: NEGATIVE
Nitrite: NEGATIVE
Protein, ur: NEGATIVE mg/dL
Specific Gravity, Urine: 1.016 (ref 1.005–1.030)
pH: 5 (ref 5.0–8.0)

## 2020-12-26 LAB — COMPREHENSIVE METABOLIC PANEL
ALT: 19 U/L (ref 0–44)
AST: 31 U/L (ref 15–41)
Albumin: 3.3 g/dL — ABNORMAL LOW (ref 3.5–5.0)
Alkaline Phosphatase: 69 U/L (ref 38–126)
Anion gap: 13 (ref 5–15)
BUN: 42 mg/dL — ABNORMAL HIGH (ref 8–23)
CO2: 17 mmol/L — ABNORMAL LOW (ref 22–32)
Calcium: 8.4 mg/dL — ABNORMAL LOW (ref 8.9–10.3)
Chloride: 103 mmol/L (ref 98–111)
Creatinine, Ser: 1.59 mg/dL — ABNORMAL HIGH (ref 0.61–1.24)
GFR, Estimated: 45 mL/min — ABNORMAL LOW (ref >60)
Glucose, Bld: 112 mg/dL — ABNORMAL HIGH (ref 70–99)
Potassium: 4.6 mmol/L (ref 3.5–5.1)
Sodium: 133 mmol/L — ABNORMAL LOW (ref 135–145)
Total Bilirubin: 1.5 mg/dL — ABNORMAL HIGH (ref 0.3–1.2)
Total Protein: 7.6 g/dL (ref 6.5–8.1)

## 2020-12-26 LAB — C DIFFICILE QUICK SCREEN W PCR REFLEX
C Diff antigen: NEGATIVE
C Diff interpretation: NOT DETECTED
C Diff toxin: NEGATIVE

## 2020-12-26 LAB — BRAIN NATRIURETIC PEPTIDE: B Natriuretic Peptide: 127.4 pg/mL — ABNORMAL HIGH (ref 0.0–100.0)

## 2020-12-26 LAB — CBC
HCT: 43.1 % (ref 39.0–52.0)
Hemoglobin: 13.7 g/dL (ref 13.0–17.0)
MCH: 29.8 pg (ref 26.0–34.0)
MCHC: 31.8 g/dL (ref 30.0–36.0)
MCV: 93.7 fL (ref 80.0–100.0)
Platelets: 150 10*3/uL (ref 150–400)
RBC: 4.6 MIL/uL (ref 4.22–5.81)
RDW: 14.6 % (ref 11.5–15.5)
WBC: 13.5 10*3/uL — ABNORMAL HIGH (ref 4.0–10.5)
nRBC: 0 % (ref 0.0–0.2)

## 2020-12-26 LAB — RESP PANEL BY RT-PCR (FLU A&B, COVID) ARPGX2
Influenza A by PCR: NEGATIVE
Influenza B by PCR: NEGATIVE
SARS Coronavirus 2 by RT PCR: NEGATIVE

## 2020-12-26 LAB — PROTIME-INR
INR: 2.1 — ABNORMAL HIGH (ref 0.8–1.2)
Prothrombin Time: 23.5 seconds — ABNORMAL HIGH (ref 11.4–15.2)

## 2020-12-26 LAB — LACTIC ACID, PLASMA
Lactic Acid, Venous: 2.4 mmol/L (ref 0.5–1.9)
Lactic Acid, Venous: 2 mmol/L (ref 0.5–1.9)

## 2020-12-26 LAB — PROCALCITONIN: Procalcitonin: 0.17 ng/mL

## 2020-12-26 MED ORDER — SODIUM CHLORIDE 0.9 % IV SOLN
2.0000 g | Freq: Once | INTRAVENOUS | Status: AC
  Administered 2020-12-26: 2 g via INTRAVENOUS
  Filled 2020-12-26: qty 2

## 2020-12-26 MED ORDER — GABAPENTIN 400 MG PO CAPS: 1200.0000 mg | ORAL_CAPSULE | Freq: Every day | ORAL | Status: DC

## 2020-12-26 MED ORDER — ACETAMINOPHEN 325 MG PO TABS
650.0000 mg | ORAL_TABLET | Freq: Four times a day (QID) | ORAL | Status: DC | PRN
  Administered 2020-12-26: 650 mg via ORAL
  Filled 2020-12-26: qty 2

## 2020-12-26 MED ORDER — VANCOMYCIN HCL IN DEXTROSE 1-5 GM/200ML-% IV SOLN
1000.0000 mg | Freq: Once | INTRAVENOUS | Status: AC
  Administered 2020-12-26: 1000 mg via INTRAVENOUS
  Filled 2020-12-26: qty 200

## 2020-12-26 MED ORDER — OLOPATADINE HCL 0.1 % OP SOLN
1.0000 [drp] | Freq: Two times a day (BID) | OPHTHALMIC | Status: DC | PRN
  Filled 2020-12-26: qty 5

## 2020-12-26 MED ORDER — PIPERACILLIN-TAZOBACTAM 3.375 G IVPB
3.3750 g | Freq: Three times a day (TID) | INTRAVENOUS | Status: DC
  Administered 2020-12-27 – 2020-12-30 (×11): 3.375 g via INTRAVENOUS
  Filled 2020-12-26 (×11): qty 50

## 2020-12-26 MED ORDER — FLUOXETINE HCL 20 MG PO CAPS
40.0000 mg | ORAL_CAPSULE | Freq: Every day | ORAL | Status: DC
  Administered 2020-12-26 – 2020-12-30 (×5): 40 mg via ORAL
  Filled 2020-12-26 (×5): qty 2

## 2020-12-26 MED ORDER — SODIUM CHLORIDE 0.9 % IV BOLUS: 250.0000 mL | Freq: Once | INTRAVENOUS | Status: DC

## 2020-12-26 MED ORDER — QUETIAPINE FUMARATE 25 MG PO TABS
25.0000 mg | ORAL_TABLET | Freq: Every day | ORAL | Status: DC
  Administered 2020-12-27: 25 mg via ORAL
  Filled 2020-12-26: qty 1

## 2020-12-26 MED ORDER — MORPHINE SULFATE (PF) 2 MG/ML IV SOLN: 0.5000 mg | INTRAVENOUS | Status: DC | PRN

## 2020-12-26 MED ORDER — SODIUM CHLORIDE 0.9 % IV BOLUS
250.0000 mL | Freq: Once | INTRAVENOUS | Status: AC
  Administered 2020-12-26: 250 mL via INTRAVENOUS

## 2020-12-26 MED ORDER — WARFARIN SODIUM 2.5 MG PO TABS
2.5000 mg | ORAL_TABLET | Freq: Once | ORAL | Status: AC
  Administered 2020-12-26: 2.5 mg via ORAL
  Filled 2020-12-26: qty 1

## 2020-12-26 MED ORDER — DM-GUAIFENESIN ER 30-600 MG PO TB12: 1.0000 | ORAL_TABLET | Freq: Two times a day (BID) | ORAL | Status: DC | PRN

## 2020-12-26 MED ORDER — WARFARIN - PHARMACIST DOSING INPATIENT
Freq: Every day | Status: DC
  Filled 2020-12-26: qty 1

## 2020-12-26 MED ORDER — ALBUTEROL SULFATE (2.5 MG/3ML) 0.083% IN NEBU: 3.0000 mL | INHALATION_SOLUTION | RESPIRATORY_TRACT | Status: DC | PRN

## 2020-12-26 MED ORDER — SODIUM CHLORIDE 0.9 % IV SOLN: INTRAVENOUS | Status: DC

## 2020-12-26 MED ORDER — ZOLPIDEM TARTRATE 5 MG PO TABS
5.0000 mg | ORAL_TABLET | Freq: Every evening | ORAL | Status: DC | PRN
  Administered 2020-12-28: 5 mg via ORAL
  Filled 2020-12-26 (×3): qty 1

## 2020-12-26 MED ORDER — ONDANSETRON HCL 4 MG/2ML IJ SOLN: 4.0000 mg | Freq: Three times a day (TID) | INTRAMUSCULAR | Status: DC | PRN

## 2020-12-26 MED ORDER — METRONIDAZOLE 500 MG/100ML IV SOLN
500.0000 mg | Freq: Once | INTRAVENOUS | Status: AC
  Administered 2020-12-26: 500 mg via INTRAVENOUS
  Filled 2020-12-26 (×2): qty 100

## 2020-12-26 MED ORDER — LACTATED RINGERS IV BOLUS: 500.0000 mL | Freq: Once | INTRAVENOUS | Status: DC

## 2020-12-26 MED ORDER — PREDNISONE 20 MG PO TABS
10.0000 mg | ORAL_TABLET | Freq: Every day | ORAL | Status: DC
  Administered 2020-12-27 – 2020-12-30 (×4): 10 mg via ORAL
  Filled 2020-12-26 (×4): qty 1

## 2020-12-26 MED ORDER — HYDROCORTISONE SOD SUC (PF) 100 MG IJ SOLR
100.0000 mg | Freq: Once | INTRAMUSCULAR | Status: AC
  Administered 2020-12-26: 100 mg via INTRAVENOUS
  Filled 2020-12-26: qty 2

## 2020-12-26 MED ORDER — LACTATED RINGERS IV BOLUS: 1000.0000 mL | Freq: Once | INTRAVENOUS | Status: DC

## 2020-12-26 MED ORDER — ASPIRIN EC 81 MG PO TBEC
81.0000 mg | DELAYED_RELEASE_TABLET | Freq: Every day | ORAL | Status: DC
  Administered 2020-12-26 – 2020-12-30 (×5): 81 mg via ORAL
  Filled 2020-12-26 (×5): qty 1

## 2020-12-26 MED ORDER — ROPINIROLE HCL 1 MG PO TABS
2.0000 mg | ORAL_TABLET | Freq: Every evening | ORAL | Status: DC | PRN
  Filled 2020-12-26: qty 2

## 2020-12-26 NOTE — H&P (Signed)
History and Physical    Kenneth Summers JYN:[id number] DOB: February 19, 1945 DOA: 12/26/2020  Referring MD/NP/PA:   PCP: Toni Arthurs, NP   Patient coming from:  The patient is coming from home.  At baseline, pt is independent for most of ADL.        Chief Complaint: diarrhea   HPI: Kenneth Summers is a 75 y.o. male with medical history significant of stroke with left-sided weakness (bedbound), hypertension, hyperlipidemia, GERD, depression, RA and SLE on chronic prednisone, PAD, IgA deficiency, IBS, C. difficile colitis, CKD-3A, BPH, chronic back pain, PAD, antiphospholipid syndrome on Coumadin, who presents with diarrhea.  Per his daughter at bedside, patient has chronic intermittent diarrhea, which has worsened in the past 3 days.  Patient has at least 5 times of watery diarrhea each day, mixed with mucus and maroon-colored stool. Patient does not have nausea or vomiting.  Patient has intermittent mild central and left sided abdominal pain.  No fever or chills.  Patient has dry cough, denies shortness of breath.  Patient states that he had some chest discomfort yesterday, which is resolved.  Currently no active chest pain.  Denies symptoms of UTI.  He has blisters I both heels.  ED Course: pt was found to have WBC 13.5, lactic acid 2.4, INR 2.1, PTT 61, BNP 127, negative COVID PCR, negative flu, worsening renal function, lipase 31,  negative C diff, temperature 100.7, blood pressure 118/96, heart rate 59, RR 22, oxygen saturation 96% on room air.  Chest x-ray showed cardiomegaly and bilateral interstitial opacity.  CT of abdomen/pelvis that showed colitis.  Patient is admitted to [facility] bed as inpatient.  CT-abd/pelvis 1. The distal sigmoid colon demonstrates wall thickening with adjacent fat stranding. These findings may extend into the proximal rectum. The findings are consistent with nonspecific colitis which could be infectious, inflammatory, or ischemic. Recommend  clinical correlation. 2. The dependent opacities in the lung bases favored represent atelectasis rather than infiltrate. 3. Calcified atherosclerosis in the aorta and iliac vessels. 4. Possible sludge or stones in the neck of the gallbladder without wall thickening or Peri cholecystic fluid. 5. No other abnormalities.  Review of Systems:   General: no fevers, chills, no body weight gain, has poor appetite, has fatigue HEENT: no blurry vision, hearing changes or sore throat Respiratory: no dyspnea, has coughing, no wheezing CV: no chest pain, no palpitations GI: no nausea, vomiting, has abdominal pain, diarrhea, no constipation GU: no dysuria, burning on urination, increased urinary frequency, hematuria  Ext: has trace leg edema Neuro: no unilateral weakness, numbness, or tingling, no vision change or hearing loss Skin: no rash. Blister in both heels MSK: No muscle spasm, no limitation of range of movement in spin Heme: No easy bruising.  Travel history: No recent long distant travel.  Allergy:  Allergies  Allergen Reactions   Hydroxychloroquine Other (See Comments)    Other reaction(s): Other (See Comments)  Causing blindness Visual problems     Past Medical History:  Diagnosis Date   Actinic keratosis    Alcohol abuse    APS (antiphospholipid syndrome) (HCC)    Basal cell carcinoma    BPH (benign prostatic hyperplasia)    CAD (coronary artery disease)    Cellulitis    Cervical spinal stenosis    Chronic osteomyelitis (HCC)    CKD (chronic kidney disease)    CKD (chronic kidney disease)    Clostridium difficile diarrhea    Depression    Diplopia    Fatigue  GERD (gastroesophageal reflux disease)    Hyperlipemia    Hypertension    Hypovitaminosis D    IBS (irritable bowel syndrome)    IgA deficiency (HCC)    Insomnia    Left lumbosacral radiculopathy    Moderate obstructive sleep apnea    Osteomyelitis of foot (HCC)    PAD (peripheral artery disease) (HCC)     Pruritus    RA (rheumatoid arthritis) (HCC)    Radiculopathy    Restless legs syndrome    RLS (restless legs syndrome)    SLE (systemic lupus erythematosus related syndrome) ([place])    Squamous cell carcinoma of skin 01/27/2019   right mid lower ear helix   Stroke ([place])    Toxic maculopathy of both eyes     Past Surgical History:  Procedure Laterality Date   CARDIAC CATHETERIZATION     CAROTID ENDARTERECTOMY     CERVICAL LAMINECTOMY     CORONARY ANGIOPLASTY     FRACTURE SURGERY     fusion C5-6-7     HERNIA REPAIR     KNEE ARTHROSCOPY     TONSILLECTOMY      Social History:  reports that he has quit smoking. His smoking use included cigarettes. He has a 20.00 pack-year smoking history. He has never used smokeless tobacco. He reports that he does not currently use alcohol. He reports that he does not use drugs.  Family History:  Family History  Problem Relation Age of Onset   COPD Mother    Osteoporosis Mother    Stroke Father    Osteoporosis Sister    Lupus Niece      Prior to Admission medications   Medication Sig Start Date End Date Taking? Authorizing Provider  aspirin EC 81 MG tablet Take by mouth daily.    [provider]  atorvastatin (LIPITOR) 80 MG tablet Take 80 mg by mouth Nightly.     [provider]  FLUoxetine (PROZAC) 40 MG capsule Take 40 mg by mouth daily.    [provider]  fluticasone (FLONASE) 50 MCG/ACT nasal spray Place into both nostrils daily.    [provider]  gabapentin (NEURONTIN) 400 MG capsule Take 1,200 mg by mouth at bedtime.    [provider]  hydrocortisone 2.5 % cream Apply to affected areas face 1-2 times daily as directed. 05/20/19   Brendolyn Patty, MD  ketoconazole (NIZORAL) 2 % cream Apply to affected areas face 1-2 times daily as directed. 05/20/19   Brendolyn Patty, MD  lamoTRIgine (LAMICTAL) 25 MG tablet Take 25 mg by mouth 2 (two) times daily.    [provider]  losartan  (COZAAR) 25 MG tablet Take 25 mg by mouth daily.    [provider]  montelukast (SINGULAIR) 10 MG tablet Take 10 mg by mouth daily.    [provider]  olopatadine (PATANOL) 0.1 % ophthalmic solution Place 1 drop into both eyes 2 (two) times daily.    [provider]  predniSONE (DELTASONE) 5 MG tablet Take 5 mg by mouth daily with breakfast.    [provider]  QUEtiapine (SEROQUEL) 25 MG tablet Take 25-50 mg at night 10/15/18   [provider]  rOPINIRole (REQUIP) 2 MG tablet Take 2 mg by mouth at bedtime as needed.     [provider]  warfarin (COUMADIN) 2.5 MG tablet Take 2.5 mg by mouth daily. Tues, Wednesday Thursday, Saturday, Sunday 08/29/18   [provider]  warfarin (COUMADIN) 5 MG tablet Take 5  mg by mouth 2 (two) times a week. Monday and Friday.    [provider]    Physical Exam: Vitals:   12/26/20 1230 12/26/20 1300 12/26/20 1322 12/26/20 1400  BP: 115/67 103/67  (!) 118/96  Pulse: 61 (!) 59  60  Resp: (!) 21 16  (!) 22  Temp: 99.3 F (37.4 C)  (!) 100.7 F (38.2 C)   TempSrc: Oral  Rectal   SpO2: 96% 97%  96%  Weight:      Height:       General: Not in acute distress HEENT:       Eyes: PERRL, EOMI, no scleral icterus.       ENT: No discharge from the ears and nose, no pharynx injection, no tonsillar enlargement.        Neck: No JVD, no bruit, no mass felt. Heme: No neck lymph node enlargement. Cardiac: S1/S2, RRR, No murmurs, No gallops or rubs. Respiratory: No rales, wheezing, rhonchi or rubs. GI: Soft, nondistended, has tenderness in central and left side, no rebound pain, no organomegaly, BS present. GU: No hematuria Ext: has trace leg edema bilaterally. 1+DP/PT pulse bilaterally. Musculoskeletal: No joint deformities, No joint redness or warmth, no limitation of ROM in spin. Skin: No rashes. Has blister in both heels.     Neuro: Alert, oriented X3, cranial nerves II-XII grossly intact,  has chronic left-sided weakness. Psych: Patient is not psychotic, no suicidal or hemocidal ideation.  Labs on Admission: I have personally reviewed following labs and imaging studies  CBC: Recent Labs  Lab 12/26/20 1021  WBC 13.5*  HGB 13.7  HCT 43.1  MCV 93.7  PLT 545   Basic Metabolic Panel: Recent Labs  Lab 12/26/20 1021  NA 133*  K 4.6  CL 103  CO2 17*  GLUCOSE 112*  BUN 42*  CREATININE 1.59*  CALCIUM 8.4*   GFR: Estimated Creatinine Clearance: 49.8 mL/min (A) (by C-G formula based on SCr of 1.59 mg/dL (H)). Liver Function Tests: Recent Labs  Lab 12/26/20 1021  AST 31  ALT 19  ALKPHOS 69  BILITOT 1.5*  PROT 7.6  ALBUMIN 3.3*   Recent Labs  Lab 12/26/20 1021  LIPASE 31   No results for input(s): AMMONIA in the last 168 hours. Coagulation Profile: Recent Labs  Lab 12/26/20 1243  INR 2.1*   Cardiac Enzymes: No results for input(s): CKTOTAL, CKMB, CKMBINDEX, TROPONINI in the last 168 hours. BNP (last 3 results) No results for input(s): PROBNP in the last 8760 hours. HbA1C: No results for input(s): HGBA1C in the last 72 hours. CBG: No results for input(s): GLUCAP in the last 168 hours. Lipid Profile: No results for input(s): CHOL, HDL, LDLCALC, TRIG, CHOLHDL, LDLDIRECT in the last 72 hours. Thyroid Function Tests: No results for input(s): TSH, T4TOTAL, FREET4, T3FREE, THYROIDAB in the last 72 hours. Anemia Panel: No results for input(s): VITAMINB12, FOLATE, FERRITIN, TIBC, IRON, RETICCTPCT in the last 72 hours. Urine analysis:    Component Value Date/Time   COLORURINE YELLOW (A) 10/18/2018 1222   APPEARANCEUR CLEAR (A) 10/18/2018 1222   APPEARANCEUR Clear 04/23/2013 0903   LABSPEC 1.012 10/18/2018 1222   LABSPEC 1.004 04/23/2013 0903   PHURINE 5.0 10/18/2018 1222   GLUCOSEU NEGATIVE 10/18/2018 1222   GLUCOSEU Negative 04/23/2013 0903   HGBUR SMALL (A) 10/18/2018 1222   BILIRUBINUR NEGATIVE 10/18/2018 1222   BILIRUBINUR Negative 04/23/2013  [place] 10/18/2018 1222   PROTEINUR NEGATIVE 10/18/2018 1222   NITRITE NEGATIVE 10/18/2018 1222  LEUKOCYTESUR NEGATIVE 10/18/2018 1222   LEUKOCYTESUR 1+ 04/23/2013 0903   Sepsis Labs: @LABRCNTIP (procalcitonin:4,lacticidven:4) ) Recent Results (from the past 240 hour(s))  Resp Panel by RT-PCR (Flu A&B, Covid) Nasopharyngeal Swab     Status: None   Collection Time: 12/26/20 12:43 PM   Specimen: Nasopharyngeal Swab; Nasopharyngeal(NP) swabs in vial transport medium  Result Value Ref Range Status   SARS Coronavirus 2 by RT PCR NEGATIVE NEGATIVE Final    Comment: (NOTE) SARS-CoV-2 target nucleic acids are NOT DETECTED.  The SARS-CoV-2 RNA is generally detectable in upper respiratory specimens during the acute phase of infection. The lowest concentration of SARS-CoV-2 viral copies this assay can detect is 138 copies/mL. A negative result does not preclude SARS-Cov-2 infection and should not be used as the sole basis for treatment or other patient management decisions. A negative result may occur with  improper specimen collection/handling, submission of specimen other than nasopharyngeal swab, presence of viral mutation(s) within the areas targeted by this assay, and inadequate number of viral copies(<138 copies/mL). A negative result must be combined with clinical observations, patient history, and epidemiological information. The expected result is Negative.  Fact Sheet for Patients:  EntrepreneurPulse.com.au  Fact Sheet for Healthcare Providers:  IncredibleEmployment.be  This test is no t yet approved or cleared by the [place] FDA and  has been authorized for detection and/or diagnosis of SARS-CoV-2 by FDA under an Emergency Use Authorization (EUA). This EUA will remain  in effect (meaning this test can be used) for the duration of the COVID-19 declaration under Section 564(b)(1) of the Act, 21 U.S.C.section  360bbb-3(b)(1), unless the authorization is terminated  or revoked sooner.       Influenza A by PCR NEGATIVE NEGATIVE Final   Influenza B by PCR NEGATIVE NEGATIVE Final    Comment: (NOTE) The Xpert Xpress SARS-CoV-2/FLU/RSV plus assay is intended as an aid in the diagnosis of influenza from Nasopharyngeal swab specimens and should not be used as a sole basis for treatment. Nasal washings and aspirates are unacceptable for Xpert Xpress SARS-CoV-2/FLU/RSV testing.  Fact Sheet for Patients: EntrepreneurPulse.com.au  Fact Sheet for Healthcare Providers: IncredibleEmployment.be  This test is not yet approved or cleared by the [place] FDA and has been authorized for detection and/or diagnosis of SARS-CoV-2 by FDA under an Emergency Use Authorization (EUA). This EUA will remain in effect (meaning this test can be used) for the duration of the COVID-19 declaration under Section 564(b)(1) of the Act, 21 U.S.C. section 360bbb-3(b)(1), unless the authorization is terminated or revoked.  Performed at [facility], [address] [place], [place] [address]   C Difficile Quick Screen w PCR reflex     Status: None   Collection Time: 12/26/20 12:49 PM   Specimen: Stool  Result Value Ref Range Status   C Diff antigen NEGATIVE NEGATIVE Final   C Diff toxin NEGATIVE NEGATIVE Final   C Diff interpretation No C. difficile detected.  Final    Comment: Performed at [facility], [address], [place], [place] [address]     Radiological Exams on Admission: CT ABDOMEN PELVIS WO CONTRAST  Result Date: 12/26/2020 CLINICAL DATA:  Abdominal pain.  Diarrhea. EXAM: CT ABDOMEN AND PELVIS WITHOUT CONTRAST TECHNIQUE: Multidetector CT imaging of the abdomen and pelvis was performed following the standard protocol without IV contrast. COMPARISON:  April 11, 2013 FINDINGS: Lower chest: Dependent opacities in the lung bases favored represent  atelectasis rather than infiltrate. Three-vessel coronary artery disease. No other abnormalities in the  lower chest. Hepatobiliary: The liver is unremarkable. High attenuation near the neck of the gallbladder could represent sludge or stones. No gallbladder wall thickening or adjacent fluid. Pancreas: Unremarkable. No pancreatic ductal dilatation or surrounding inflammatory changes. Spleen: Normal in size without focal abnormality. Adrenals/Urinary Tract: Adrenal glands are normal. The kidneys are relatively atrophic with a right renal cyst. A few nonobstructive stones are identified in the kidneys. A calcification in the right renal hilum is vascular in nature. Bilateral perinephric stranding is stable. No ureterectasis or ureteral stones. The bladder is normal. Stomach/Bowel: The stomach and small bowel are normal. There is a short segment of distal descending colon, near the junction with the rectum that is thick walled with adjacent fat stranding. Extension into the proximal rectum is possible. The remainder of the colon is normal. The appendix is normal. Vascular/Lymphatic: Calcified atherosclerosis is seen in the nonaneurysmal aorta extending into the iliac vessels and femoral vessels. No adenopathy. Reproductive: Prostate is unremarkable. Other: No abdominal wall hernia or abnormality. No abdominopelvic ascites. Musculoskeletal: No acute or significant osseous findings. IMPRESSION: 1. The distal sigmoid colon demonstrates wall thickening with adjacent fat stranding. These findings may extend into the proximal rectum. The findings are consistent with nonspecific colitis which could be infectious, inflammatory, or ischemic. Recommend clinical correlation. 2. The dependent opacities in the lung bases favored represent atelectasis rather than infiltrate. 3. Calcified atherosclerosis in the aorta and iliac vessels. 4. Possible sludge or stones in the neck of the gallbladder without wall thickening or Peri  cholecystic fluid. 5. No other abnormalities. Electronically Signed   By: Dorise Bullion III M.D.   On: 12/26/2020 14:15   DG Chest Portable 1 View  Result Date: 12/26/2020 CLINICAL DATA:  Shortness of breath EXAM: PORTABLE CHEST 1 VIEW COMPARISON:  02/19/2020 FINDINGS: Cardiomegaly mild, diffuse bilateral interstitial pulmonary opacity. Small bilateral pleural effusions. The visualized skeletal structures are unremarkable. IMPRESSION: 1. Cardiomegaly with mild, diffuse bilateral interstitial pulmonary opacity, likely edema. 2.  Small bilateral pleural effusions. Electronically Signed   By: Delanna Ahmadi M.D.   On: 12/26/2020 12:25     EKG: I have personally reviewed.  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  C. difficile negative.  -Admitted to MedSurg bed as inpatient -IV Zosyn (patient received 1 dose of vancomycin, cefepime and Flagyl in ED) -Pending GI pathogen panel -Follow-up blood culture -will get Procalcitonin and trend lactic acid levels per sepsis protocol. -IVF: 250 cc of NS x 2 bolus in ED, followed by 75 cc/h (patient has interstitial edema, limiting aggressive IV fluids treatment). 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 -Give 100 mg of of Solu-Cortef for stress steroid -Resume prednisone 10 mg daily tomorrow  APS (antiphospholipid syndrome) ([place]): Patient is on Coumadin with INR 2.1.  Patient reports maroon-colored stool, but hemoglobin stable 13.7 (13.2 on 02/19/2020).  Patient is at high risk of developing blood clot, will continue Coumadin and monitor hemoglobin closely -Coumadin per pharm  Acute renal failure superimposed on stage 3a chronic kidney disease ([place]): Possibly due to dehydration and continuation of Cozaar -Cozaar is on hold -IV fluid as above  Stroke (HCC) -Aspirin  Depression -Continue home medications  Blisters in both heel: -Wound care consult     DVT ppx: on coumadin Code Status: DNR per pt and his daughter Family Communication:   Yes, patient's  daughter  at bed side Disposition Plan:  Anticipate discharge back to previous environment Consults called:  none Admission status and Level of care: Med-Surg:    as inpt    Status is: Inpatient  Remains inpatient appropriate because: Patient has multiple comorbidities, now presents with severe sepsis due to acute colitis, pending C. difficile test.  Patient also has worsening renal function.  His presentation is highly complicated.  Patient is at high risk of deteriorating.  Need to be treated in hospital for at least 2 days.           Date of Service 12/26/2020    Ivor Costa [facility]   If 7PM-7AM, please contact  night-coverage [email or web address] 12/26/2020, 5:52 PM

## 2020-12-26 NOTE — Consult Note (Signed)
PHARMACY -  BRIEF ANTIBIOTIC NOTE   Pharmacy has received consult(s) for Vancomycin and Cefepime from an ED provider.  The patient's profile has been reviewed for ht/wt/allergies/indication/available labs.    One time order(s) placed for Vancomycin 1g IV and Cefepime 2g IV x 1 dose each.  Further antibiotics/pharmacy consults should be ordered by admitting physician if indicated.                       Thank you, Pearla Dubonnet 12/26/2020  1:41 PM

## 2020-12-26 NOTE — ED Notes (Signed)
EDP aware of Lactic Acid of 2.4.

## 2020-12-26 NOTE — ED Notes (Signed)
Daughter weeping. Daughter asking questions such as whether the rooms are cleaned between patients. Attempted to provide reassurance to daughter. Informed EDP who will come speak with daughter about plan of care shortly.

## 2020-12-26 NOTE — ED Provider Notes (Signed)
[facility] Emergency Department Provider Note  ____________________________________________   Event Date/Time   First MD Initiated Contact with Patient 12/26/20 1143     (approximate)  I have reviewed the triage vital signs and the nursing notes.   HISTORY  Chief Complaint Diarrhea and Cough    HPI Kenneth Summers is a 75 y.o. male  with pmhx lupus, CKD, HTN, HLD, CVA now bedbound, here with diarrhea. History provided by pt. Per report, he has had watery, loose diarrhea for the last 6 weeks. He states it began gradually and has persisted, despite trying antidiarrheals. Reports that he has loose, watery BMS, and has had difficulty controlling his BMs, like when he is coughing. This is new for him. Denies any preceding ABX use or illness. Does not recall any fevers, chills. No recent travel. No recent med changes. He does not know if he has a h/o diverticulitis, denies h/o significant intra-abd surgeries. No pain. He feels tired, weaker than usual btu no focal weakness or new numbness appreciated.         Past Medical History:  Diagnosis Date   Actinic keratosis    Alcohol abuse    APS (antiphospholipid syndrome) (HCC)    Basal cell carcinoma    BPH (benign prostatic hyperplasia)    CAD (coronary artery disease)    Cellulitis    Cervical spinal stenosis    Chronic osteomyelitis (HCC)    CKD (chronic kidney disease)    CKD (chronic kidney disease)    Clostridium difficile diarrhea    Depression    Diplopia    Fatigue    GERD (gastroesophageal reflux disease)    Hyperlipemia    Hypertension    Hypovitaminosis D    IBS (irritable bowel syndrome)    IgA deficiency (HCC)    Insomnia    Left lumbosacral radiculopathy    Moderate obstructive sleep apnea    Osteomyelitis of foot (HCC)    PAD (peripheral artery disease) (HCC)    Pruritus    RA (rheumatoid arthritis) (HCC)    Radiculopathy    Restless legs syndrome    RLS (restless legs  syndrome)    SLE (systemic lupus erythematosus related syndrome) ([place])    Squamous cell carcinoma of skin 01/27/2019   right mid lower ear helix   Stroke ([place])    Toxic maculopathy of both eyes     Patient Active Problem List   Diagnosis Date Noted   Acute colitis 12/26/2020   Severe sepsis ([place]) 12/26/2020   APS (antiphospholipid syndrome) (HCC)    Cough    Acute renal failure superimposed on stage 3a chronic kidney disease ([place])    Stroke ([place])    Irritant contact dermatitis due to fecal, urinary or dual incontinence 10/21/2020   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/01/2020   Radiculopathy, lumbosacral region 09/07/2020   Unspecified urinary incontinence 09/07/2020   Pressure injury of right buttock, stage 2 ([place]) 06/16/2020   Sepsis ([place]) 10/18/2018   Long term (current) use of anticoagulants 05/01/2018   History of stroke 04/11/2018   Cerebrovascular accident (CVA) due to occlusion of right posterior cerebral artery ([place]) 10/24/2017   Hyperlipidemia 07/04/2017   Chronic diarrhea 06/12/2017   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 ([place]) 04/27/2016   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   Visual changes 06/19/2013   Anterolisthesis 01/16/2013   Cervical spinal stenosis 01/16/2013   Weakness 01/15/2013   Benign prostatic hyperplasia 01/07/2013   Depression 01/07/2013  Restless legs syndrome 01/07/2013   Chronic kidney disease, stage 3 unspecified ([place]) 12/20/2011   CAD (coronary artery disease) 11/03/2010   Hypertension 11/03/2010   PAD (peripheral artery disease) ([place]) 11/03/2010   SLE (systemic lupus erythematosus) ([place]) 11/03/2010    Past Surgical History:  Procedure Laterality Date   CARDIAC CATHETERIZATION     CAROTID ENDARTERECTOMY     CERVICAL LAMINECTOMY     CORONARY ANGIOPLASTY     FRACTURE SURGERY     fusion C5-6-7     HERNIA REPAIR     KNEE ARTHROSCOPY     TONSILLECTOMY      Prior to Admission medications    Medication Sig Start Date End Date Taking? Authorizing Provider  aspirin EC 81 MG tablet Take by mouth daily.   Yes [provider]  atenolol (TENORMIN) 25 MG tablet Take 12.5 mg by mouth daily. 09/07/20  Yes [provider]  Cyanocobalamin (VITAMIN B 12 PO) Take by mouth.   Yes [provider]  FLUoxetine (PROZAC) 40 MG capsule Take 40 mg by mouth daily.   Yes [provider]  fluticasone (FLONASE) 50 MCG/ACT nasal spray Place into both nostrils daily.   Yes [provider]  gabapentin (NEURONTIN) 400 MG capsule Take 1,200 mg by mouth at bedtime.   Yes [provider]  hydrocortisone 2.5 % cream Apply to affected areas face 1-2 times daily as directed. 05/20/19  Yes Brendolyn Patty, MD  ketoconazole (NIZORAL) 2 % cream Apply to affected areas face 1-2 times daily as directed. 05/20/19  Yes Brendolyn Patty, MD  olopatadine (PATANOL) 0.1 % ophthalmic solution Place 1 drop into both eyes 2 (two) times daily.   Yes [provider]  QUEtiapine (SEROQUEL) 25 MG tablet Take 25-50 mg at night 10/15/18  Yes [provider]  rOPINIRole (REQUIP) 2 MG tablet Take 2 mg by mouth at bedtime as needed.    Yes [provider]  warfarin (COUMADIN) 2.5 MG tablet Take 2.5 mg by mouth daily. Tues, Wednesday Thursday, Saturday, Sunday 08/29/18  Yes [provider]  warfarin (COUMADIN) 5 MG tablet Take 5 mg by mouth 2 (two) times a week. Monday and Friday.   Yes [provider]  atorvastatin (LIPITOR) 80 MG tablet Take 80 mg by mouth Nightly.  Patient not taking: Reported on 12/26/2020    [provider]  lamoTRIgine (LAMICTAL) 25 MG tablet Take 25 mg by mouth 2 (two) times daily. Patient not taking: Reported on 12/26/2020    [provider]  losartan (COZAAR) 25 MG tablet Take 25 mg by mouth daily. Patient not taking: Reported on 12/26/2020    [provider]  montelukast (SINGULAIR) 10 MG tablet Take  10 mg by mouth daily. Patient not taking: Reported on 12/26/2020    [provider]  predniSONE (DELTASONE) 5 MG tablet Take 5 mg by mouth daily with breakfast. Patient not taking: Reported on 12/26/2020    [provider]    Allergies Hydroxychloroquine  Family History  Problem Relation Age of Onset   COPD Mother    Osteoporosis Mother    Stroke Father    Osteoporosis Sister    Lupus Niece     Social History Social History   Tobacco Use   Smoking status: Former    Packs/day: 1.00    Years: 20.00    Pack years: 20.00    Types: Cigarettes   Smokeless tobacco: Never  Substance Use Topics   Alcohol use: Not Currently  Comment: Socially   Drug use: No    Review of Systems  Review of Systems  Constitutional:  Positive for fatigue. Negative for chills and fever.  HENT:  Negative for sore throat.   Respiratory:  Negative for shortness of breath.   Cardiovascular:  Negative for chest pain.  Gastrointestinal:  Positive for diarrhea and nausea. Negative for abdominal pain.  Genitourinary:  Negative for flank pain.  Musculoskeletal:  Negative for neck pain.  Skin:  Negative for rash and wound.  Allergic/Immunologic: Negative for immunocompromised state.  Neurological:  Positive for weakness. Negative for numbness.  Hematological:  Does not bruise/bleed easily.  All other systems reviewed and are negative.   ____________________________________________  PHYSICAL EXAM:      VITAL SIGNS: ED Triage Vitals [12/26/20 1003]  Enc Vitals Group     BP      Pulse      Resp      Temp      Temp src      SpO2      Weight 200 lb (90.7 kg)     Height 6\' 5"  (1.956 m)     Head Circumference      Peak Flow      Pain Score 0     Pain Loc      Pain Edu?      Excl. in [place]?      Physical Exam Vitals and nursing note reviewed.  Constitutional:      General: He is not in acute distress.    Appearance: He is well-developed.  HENT:     Head: Normocephalic  and atraumatic.     Mouth/Throat:     Mouth: Mucous membranes are dry.  Eyes:     Conjunctiva/sclera: Conjunctivae normal.  Cardiovascular:     Rate and Rhythm: Normal rate and regular rhythm.     Heart sounds: Normal heart sounds.  Pulmonary:     Effort: Pulmonary effort is normal. No respiratory distress.     Breath sounds: No wheezing.  Abdominal:     General: There is no distension.     Tenderness: There is abdominal tenderness (moderate, LLQ). There is no rebound.  Musculoskeletal:     Cervical back: Neck supple.  Skin:    General: Skin is warm.     Capillary Refill: Capillary refill takes less than 2 seconds.     Findings: No rash.  Neurological:     Mental Status: He is alert.     Motor: No abnormal muscle tone.     Comments: Speech slightly slurred but able to converse, follow commands, answer questions appropriately. L hemiparesis at baseline.       ____________________________________________   LABS (all labs ordered are listed, but only abnormal results are displayed)  Labs Reviewed  COMPREHENSIVE METABOLIC PANEL - Abnormal; Notable for the following components:      Result Value   Sodium 133 (*)    CO2 17 (*)    Glucose, Bld 112 (*)    BUN 42 (*)    Creatinine, Ser 1.59 (*)    Calcium 8.4 (*)    Albumin 3.3 (*)    Total Bilirubin 1.5 (*)    GFR, Estimated 45 (*)    All other components within normal limits  CBC - Abnormal; Notable for the following components:   WBC 13.5 (*)    All other components within normal limits  PROTIME-INR - Abnormal; Notable for the following components:   Prothrombin Time 23.5 (*)  INR 2.1 (*)    All other components within normal limits  LACTIC ACID, PLASMA - Abnormal; Notable for the following components:   Lactic Acid, Venous 2.4 (*)    All other components within normal limits  LACTIC ACID, PLASMA - Abnormal; Notable for the following components:   Lactic Acid, Venous 2.0 (*)    All other components within normal  limits  BRAIN NATRIURETIC PEPTIDE - Abnormal; Notable for the following components:   B Natriuretic Peptide 127.4 (*)    All other components within normal limits  RESP PANEL BY RT-PCR (FLU A&B, COVID) ARPGX2  C DIFFICILE QUICK SCREEN W PCR REFLEX    CULTURE, BLOOD (ROUTINE X 2)  CULTURE, BLOOD (ROUTINE X 2)  GASTROINTESTINAL PANEL BY PCR, STOOL (REPLACES STOOL CULTURE)  LIPASE, BLOOD  PROCALCITONIN  URINALYSIS, ROUTINE W REFLEX MICROSCOPIC  BASIC METABOLIC PANEL  CBC  PROTIME-INR    ____________________________________________  EKG: Normal sinus rhythm, VR 60. PR 70, QRS 150, QRS 487. No acute ST elevations. LBBB.  ________________________________________  RADIOLOGY All imaging, including plain films, CT scans, and ultrasounds, independently reviewed by me, and interpretations confirmed via formal radiology reads.  ED MD interpretation:   CXR: Cardiomegaly with diffuse interstitial pulmonary opacity CT A/P: Distal sigmoid colon with wall thickening and fat stranding c/w colitis, no free air  Official radiology report(s): CT ABDOMEN PELVIS WO CONTRAST  Result Date: 12/26/2020 CLINICAL DATA:  Abdominal pain.  Diarrhea. EXAM: CT ABDOMEN AND PELVIS WITHOUT CONTRAST TECHNIQUE: Multidetector CT imaging of the abdomen and pelvis was performed following the standard protocol without IV contrast. COMPARISON:  April 11, 2013 FINDINGS: Lower chest: Dependent opacities in the lung bases favored represent atelectasis rather than infiltrate. Three-vessel coronary artery disease. No other abnormalities in the lower chest. Hepatobiliary: The liver is unremarkable. High attenuation near the neck of the gallbladder could represent sludge or stones. No gallbladder wall thickening or adjacent fluid. Pancreas: Unremarkable. No pancreatic ductal dilatation or surrounding inflammatory changes. Spleen: Normal in size without focal abnormality. Adrenals/Urinary Tract: Adrenal glands are normal. The kidneys  are relatively atrophic with a right renal cyst. A few nonobstructive stones are identified in the kidneys. A calcification in the right renal hilum is vascular in nature. Bilateral perinephric stranding is stable. No ureterectasis or ureteral stones. The bladder is normal. Stomach/Bowel: The stomach and small bowel are normal. There is a short segment of distal descending colon, near the junction with the rectum that is thick walled with adjacent fat stranding. Extension into the proximal rectum is possible. The remainder of the colon is normal. The appendix is normal. Vascular/Lymphatic: Calcified atherosclerosis is seen in the nonaneurysmal aorta extending into the iliac vessels and femoral vessels. No adenopathy. Reproductive: Prostate is unremarkable. Other: No abdominal wall hernia or abnormality. No abdominopelvic ascites. Musculoskeletal: No acute or significant osseous findings. IMPRESSION: 1. The distal sigmoid colon demonstrates wall thickening with adjacent fat stranding. These findings may extend into the proximal rectum. The findings are consistent with nonspecific colitis which could be infectious, inflammatory, or ischemic. Recommend clinical correlation. 2. The dependent opacities in the lung bases favored represent atelectasis rather than infiltrate. 3. Calcified atherosclerosis in the aorta and iliac vessels. 4. Possible sludge or stones in the neck of the gallbladder without wall thickening or Peri cholecystic fluid. 5. No other abnormalities. Electronically Signed   By: Dorise Bullion III M.D.   On: 12/26/2020 14:15   DG Chest Portable 1 View  Result Date: 12/26/2020 CLINICAL DATA:  Shortness of  breath EXAM: PORTABLE CHEST 1 VIEW COMPARISON:  02/19/2020 FINDINGS: Cardiomegaly mild, diffuse bilateral interstitial pulmonary opacity. Small bilateral pleural effusions. The visualized skeletal structures are unremarkable. IMPRESSION: 1. Cardiomegaly with mild, diffuse bilateral interstitial  pulmonary opacity, likely edema. 2.  Small bilateral pleural effusions. Electronically Signed   By: Delanna Ahmadi M.D.   On: 12/26/2020 12:25    ____________________________________________  PROCEDURES   Procedure(s) performed (including Critical Care):  .Critical Care Performed by: Duffy Bruce, MD Authorized by: Duffy Bruce, MD   Critical care provider statement:    Critical care time (minutes):  30   Critical care was necessary to treat or prevent imminent or life-threatening deterioration of the following conditions:  Cardiac failure, circulatory failure and sepsis   Critical care was time spent personally by me on the following activities:  Development of treatment plan with patient or surrogate, discussions with consultants, evaluation of patient's response to treatment, examination of patient, ordering and review of laboratory studies, ordering and review of radiographic studies, ordering and performing treatments and interventions, pulse oximetry, re-evaluation of patient's condition and review of old charts   I assumed direction of critical care for this patient from another provider in my specialty: no     Care discussed with: admitting provider    ____________________________________________  INITIAL IMPRESSION / MDM / [place] / ED COURSE  As part of my medical decision making, I reviewed the following data within the [place] notes reviewed and incorporated, Old chart reviewed, Notes from prior ED visits, and [place] Controlled Substance Database       *SMARAN GAUS was evaluated in Emergency Department on 12/26/2020 for the symptoms described in the history of present illness. He was evaluated in the context of the global COVID-19 pandemic, which necessitated consideration that the patient might be at risk for infection with the SARS-CoV-2 virus that causes COVID-19. Institutional protocols and algorithms that pertain to the  evaluation of patients at risk for COVID-19 are in a state of rapid change based on information released by regulatory bodies including the CDC and federal and state organizations. These policies and algorithms were followed during the patient's care in the ED.  Some ED evaluations and interventions may be delayed as a result of limited staffing during the pandemic.*     Medical Decision Making:  75 yo M here with diarrhea, generalized weakness. On arrival, pt borderline hypotensive, but non toxic appearing. Initial temp normal. Exam remarkable for bilateral rhonchi, slight increased WOB, also LLQ TTP. Labs show leukocytosis, possible mild dehydration/elevated Cr from baseline. LA slightly elevated as well. CXR, however, concerning for pulmonary opacities likely edema. DDx includes PNA, intra-abd pathology such as diverticulitis. Repeat rectal temp is now febrile. Will send cultures, empiric abx. CT pending  CT scan shows acute colitis. DDx includes infectious colitis, C. Diff colitis. C. Diff pending. Broad spectrum abx given and very cautious fluids given, as pt has h/o CHF, appears hypervolemic on exam, and is DNR. Will admit to medicine.   ____________________________________________  FINAL CLINICAL IMPRESSION(S) / ED DIAGNOSES  Final diagnoses:  Colitis  Lactic acidosis  Sepsis, due to unspecified organism, unspecified whether acute organ dysfunction present [facility])     MEDICATIONS GIVEN DURING THIS VISIT:  Medications  sodium chloride 0.9 % bolus 250 mL (250 mLs Intravenous Not Given 12/26/20 1412)  ondansetron (ZOFRAN) injection 4 mg (has no administration in time range)  acetaminophen (TYLENOL) tablet 650 mg (650 mg Oral Given 12/26/20 1547)  albuterol (PROVENTIL) (2.5 MG/3ML) 0.083% nebulizer solution 3 mL (has no administration in time range)  dextromethorphan-guaiFENesin (MUCINEX DM) 30-600 MG per 12 hr tablet 1 tablet (has no administration in time range)  0.9 %  sodium chloride  infusion ( Intravenous New Bag/Given 12/26/20 1558)  morphine 2 MG/ML injection 0.5 mg (has no administration in time range)  Warfarin - Pharmacist Dosing Inpatient (has no administration in time range)  aspirin EC tablet 81 mg (81 mg Oral Given 12/26/20 1828)  FLUoxetine (PROZAC) capsule 40 mg (40 mg Oral Given 12/26/20 1828)  QUEtiapine (SEROQUEL) tablet 25 mg (has no administration in time range)  gabapentin (NEURONTIN) capsule 1,200 mg (has no administration in time range)  rOPINIRole (REQUIP) tablet 2 mg (has no administration in time range)  olopatadine (PATANOL) 0.1 % ophthalmic solution 1 drop (has no administration in time range)  predniSONE (DELTASONE) tablet 10 mg (has no administration in time range)  zolpidem (AMBIEN) tablet 5 mg (has no administration in time range)  piperacillin-tazobactam (ZOSYN) IVPB 3.375 g (has no administration in time range)  ceFEPIme (MAXIPIME) 2 g in sodium chloride 0.9 % 100 mL IVPB (0 g Intravenous Stopped 12/26/20 1445)  metroNIDAZOLE (FLAGYL) IVPB 500 mg (0 mg Intravenous Stopped 12/26/20 1553)  vancomycin (VANCOCIN) IVPB 1000 mg/200 mL premix (0 mg Intravenous Stopped 12/26/20 1626)  sodium chloride 0.9 % bolus 250 mL (0 mLs Intravenous Stopped 12/26/20 1445)  hydrocortisone sodium succinate (SOLU-CORTEF) 100 MG injection 100 mg (100 mg Intravenous Given 12/26/20 1549)  warfarin (COUMADIN) tablet 2.5 mg (2.5 mg Oral Given 12/26/20 1832)     ED Discharge Orders     None        Note:  This document was prepared using Dragon voice recognition software and may include unintentional dictation errors.   Duffy Bruce, MD 12/26/20 [phone]

## 2020-12-26 NOTE — ED Notes (Signed)
Per pharmacy, give the 2.5mg  warfarin with INR of 2.1.

## 2020-12-26 NOTE — Consult Note (Signed)
Pharmacy Antibiotic Note  Kenneth Summers is a 75 y.o. male admitted on 12/26/2020 with acute colitis.  Pharmacy has been consulted for Zosyn dosing.  Plan: Zosyn 3.375 gm IV q8h (4 hour infusion) Monitor clinical picture and renal function F/U C&S, abx deescalation / LOT   Height: 6\' 5"  (195.6 cm) Weight: 90.7 kg (200 lb) IBW/kg (Calculated) : 89.1  Temp (24hrs), Avg:100 F (37.8 C), Min:99.3 F (37.4 C), Max:100.7 F (38.2 C)  Recent Labs  Lab 12/26/20 1021 12/26/20 1243 12/26/20 1531  WBC 13.5*  --   --   CREATININE 1.59*  --   --   LATICACIDVEN  --  2.4* 2.0*    Estimated Creatinine Clearance: 49.8 mL/min (A) (by C-G formula based on SCr of 1.59 mg/dL (H)).    Allergies  Allergen Reactions   Hydroxychloroquine Other (See Comments)    Other reaction(s): Other (See Comments)  Causing blindness Visual problems     Antimicrobials this admission: 11/20 vanc/cefepime/flagyl x1 in the ED  11/20 Zosyn >>   Dose adjustments this admission: N/A  Microbiology results: 11/20 BCx: pending 11/20 GI panel: pending 11/20 C diff panel: negative   Thank you for allowing pharmacy to be a part of this patient's care.  Darnelle Bos, PharmD 12/26/2020 6:04 PM

## 2020-12-26 NOTE — ED Notes (Signed)
Per EDP, hold LR bolus until BNP results.

## 2020-12-26 NOTE — ED Notes (Signed)
Called pharmacy to have metronidazole sent. ED pyxis not stocked.

## 2020-12-26 NOTE — ED Triage Notes (Signed)
Pt in via EMS from home with c/o diarrhea that is mucous like, watery and has pink tinge. Pt with hx of stroke and has been bed bound since then. FSBS 136, 98.8 Temp, 114/62, HR 60. Pt leans to the left which is normal since CVA

## 2020-12-26 NOTE — ED Notes (Signed)
Pt back from CT

## 2020-12-26 NOTE — ED Triage Notes (Signed)
Lab called for assistance with blood draw 

## 2020-12-26 NOTE — Progress Notes (Signed)
[facility] following for sepsis protocol. 

## 2020-12-26 NOTE — Progress Notes (Signed)
CODE SEPSIS - PHARMACY COMMUNICATION  **Broad Spectrum Antibiotics should be administered within 1 hour of Sepsis diagnosis**  Time Code Sepsis Called/Page Received: 1345  Antibiotics Ordered: Cefepime, Vancomycin  Time of 1st antibiotic administration: 1408  Additional action taken by pharmacy: none  If necessary, Name of Provider/Nurse Contacted: n/a    Pearla Dubonnet ,PharmD Clinical Pharmacist  12/26/2020  2:14 PM

## 2020-12-26 NOTE — ED Notes (Signed)
Pharmacy messaged to send up Flagyl.

## 2020-12-26 NOTE — ED Triage Notes (Signed)
Pt reports diarrhea for the last 6 weeks. Pt states no pain just cannot stop having loose stools all the time. Pt also reports a cough for 3-4 weeks. Pt denies productive cough, SOB or other sx's.

## 2020-12-26 NOTE — ED Notes (Signed)
Daughter at bedside, states pt has multiple episodes of watery diarrhea, maroon colored, since 2d ago, with foul smell. Caregiver told daughter she thinks there is GI blood in stool. Pt has wet cough yesterday. Pt appears to have interstitial edema in arms. Pt has new blister L medial heel area. Daughter was not aware until now. Toes appear cyanotic and have skin wounds between toes.  Pt has flaccidity to L arm and paralysis to L leg from previous stroke. Pt is bedbound. Pt appeared somewhat confused and lethargic upon arrival but is now answering questions appropriately.

## 2020-12-26 NOTE — Consult Note (Signed)
WOC Nurse Consult Note: Reason for Consult:Bilateral medial heel serum-filled blisters, L>R Wound type:pressure Pressure Injury POA: Yes Measurement: Left: 1.5cm x 2.2cm  Right 1cm round Wound bed:N/A (intact, serum filled blisters) Drainage (amount, consistency, odor) None Periwound: intact, dry Dressing procedure/placement/frequency: I have provided Nursing with guidance for the topical care of these areas, covering with an antimicrobial nonadherent gauze (xeroform) and topping with dry gauze, securing with a few turns of Kerlix roll gauze/paper tape.  Feet are to be placed into bilateral pressure redistribution heel boots. A sacral prophylactic foam dressing is to be placed for PI prevention. Turning and repositioning is in place.  [place] nursing team will not follow, but will remain available to this patient, the nursing and medical teams.  Please re-consult if needed. Thanks, Maudie Flakes, MSN, RN, [place], Arther Abbott  Pager# [phone]

## 2020-12-26 NOTE — Progress Notes (Signed)
[place] for warfarin Indication:  antiphospholipid syndrome  Allergies  Allergen Reactions   Hydroxychloroquine Other (See Comments)    Other reaction(s): Other (See Comments)  Causing blindness Visual problems     Patient Measurements: Height: 6\' 5"  (195.6 cm) Weight: 90.7 kg (200 lb) IBW/kg (Calculated) : 89.1   Vital Signs: Temp: 100.7 F (38.2 C) (11/20 1322) Temp Source: Rectal (11/20 1322) BP: 118/96 (11/20 1400) Pulse Rate: 60 (11/20 1400)  Labs: Recent Labs    12/26/20 1021 12/26/20 1243  HGB 13.7  --   HCT 43.1  --   PLT 150  --   LABPROT  --  23.5*  INR  --  2.1*  CREATININE 1.59*  --     Estimated Creatinine Clearance: 49.8 mL/min (A) (by C-G formula based on SCr of 1.59 mg/dL (H)).   Medical History: Past Medical History:  Diagnosis Date   Actinic keratosis    Alcohol abuse    APS (antiphospholipid syndrome) (HCC)    Basal cell carcinoma    BPH (benign prostatic hyperplasia)    CAD (coronary artery disease)    Cellulitis    Cervical spinal stenosis    Chronic osteomyelitis (HCC)    CKD (chronic kidney disease)    CKD (chronic kidney disease)    Clostridium difficile diarrhea    Depression    Diplopia    Fatigue    GERD (gastroesophageal reflux disease)    Hyperlipemia    Hypertension    Hypovitaminosis D    IBS (irritable bowel syndrome)    IgA deficiency (HCC)    Insomnia    Left lumbosacral radiculopathy    Moderate obstructive sleep apnea    Osteomyelitis of foot (HCC)    PAD (peripheral artery disease) (HCC)    Pruritus    RA (rheumatoid arthritis) (HCC)    Radiculopathy    Restless legs syndrome    RLS (restless legs syndrome)    SLE (systemic lupus erythematosus related syndrome) ([place])    Squamous cell carcinoma of skin 01/27/2019   right mid lower ear helix   Stroke ([place])    Toxic maculopathy of both eyes     Medications:  (Not in a hospital admission)  Scheduled:    hydrocortisone sod succinate (SOLU-CORTEF) inj  100 mg Intravenous Once   Infusions:   sodium chloride     metronidazole 500 mg (12/26/20 1447)   sodium chloride     vancomycin 1,000 mg (12/26/20 1509)   PRN: acetaminophen, albuterol, dextromethorphan-guaiFENesin, morphine injection, ondansetron (ZOFRAN) IV Anti-infectives (From admission, onward)    Start     Dose/Rate Route Frequency Ordered Stop   12/26/20 1345  ceFEPIme (MAXIPIME) 2 g in sodium chloride 0.9 % 100 mL IVPB        2 g 200 mL/hr over 30 Minutes Intravenous  Once 12/26/20 1333 12/26/20 1445   12/26/20 1345  metroNIDAZOLE (FLAGYL) IVPB 500 mg        500 mg 100 mL/hr over 60 Minutes Intravenous  Once 12/26/20 1333     12/26/20 1345  vancomycin (VANCOCIN) IVPB 1000 mg/200 mL premix        1,000 mg 200 mL/hr over 60 Minutes Intravenous  Once 12/26/20 1333         Assessment: 75YOM PMH triple positive APS (+aCL IgG/IgM, +, lupus anticoagulant, +anti-beta2 glycoprotein IgG) CVA, CAD, aortic atherosclerosis, HTN, HLD, CKD Stage 3, on warfarin PTA. Patient presenting to ED with diarrhea x 6 weeks. Per  notes from hematology (Dr. Dionne Milo at [facility]) patient's INR goal is 2-3.  INR from most recent outpatient visits (11/29/20-12/20/20) have been > 2 (INRs ranging 2.2-3.2).  BL CBC stable-- Hgb 13.7, Platelets WNL  PTA schedule:  warfarin 2.5 mg Tues, Wed, Thurs, Sat, Sun warfarin 5 mg Mon, Fri   Date INR Warfarin Dose  11/19 -- PTA warfarin 2.5 mg  11/20 2.1 Warfarin 5 mg    DDI: cefepime x 1: increased anticoagulant effect metronidazole x 1: increased INR PTA aspirin 81 mg daily PTA quetiapine: enhanced anticoagulant effect   Goal of Therapy:  INR 2-3 Monitor platelets by anticoagulation protocol: Yes   Plan:  INR within goal. Ensure patient maintains therapeutic INR given high risk of thrombosis.  Give warfarin 2.5 mg tonight.  Daily CBC, INR.    Wynelle Cleveland, PharmD Pharmacy Resident   12/26/2020 5:04 PM

## 2020-12-27 ENCOUNTER — Encounter: Payer: Medicare Other | Admitting: Speech Pathology

## 2020-12-27 ENCOUNTER — Encounter: Payer: Medicare Other | Admitting: Occupational Therapy

## 2020-12-27 ENCOUNTER — Ambulatory Visit: Payer: Medicare Other

## 2020-12-27 DIAGNOSIS — A419 Sepsis, unspecified organism: Secondary | ICD-10-CM | POA: Diagnosis not present

## 2020-12-27 DIAGNOSIS — R197 Diarrhea, unspecified: Secondary | ICD-10-CM | POA: Diagnosis not present

## 2020-12-27 DIAGNOSIS — G40909 Epilepsy, unspecified, not intractable, without status epilepticus: Secondary | ICD-10-CM

## 2020-12-27 DIAGNOSIS — F32A Depression, unspecified: Secondary | ICD-10-CM

## 2020-12-27 DIAGNOSIS — K529 Noninfective gastroenteritis and colitis, unspecified: Secondary | ICD-10-CM | POA: Diagnosis not present

## 2020-12-27 DIAGNOSIS — N179 Acute kidney failure, unspecified: Secondary | ICD-10-CM | POA: Diagnosis not present

## 2020-12-27 LAB — BASIC METABOLIC PANEL
Anion gap: 7 (ref 5–15)
BUN: 38 mg/dL — ABNORMAL HIGH (ref 8–23)
CO2: 26 mmol/L (ref 22–32)
Calcium: 8 mg/dL — ABNORMAL LOW (ref 8.9–10.3)
Chloride: 103 mmol/L (ref 98–111)
Creatinine, Ser: 1.34 mg/dL — ABNORMAL HIGH (ref 0.61–1.24)
GFR, Estimated: 55 mL/min — ABNORMAL LOW (ref >60)
Glucose, Bld: 124 mg/dL — ABNORMAL HIGH (ref 70–99)
Potassium: 4.4 mmol/L (ref 3.5–5.1)
Sodium: 136 mmol/L (ref 135–145)

## 2020-12-27 LAB — CBC
HCT: 35.9 % — ABNORMAL LOW (ref 39.0–52.0)
Hemoglobin: 11.3 g/dL — ABNORMAL LOW (ref 13.0–17.0)
MCH: 29.4 pg (ref 26.0–34.0)
MCHC: 31.5 g/dL (ref 30.0–36.0)
MCV: 93.5 fL (ref 80.0–100.0)
Platelets: 153 10*3/uL (ref 150–400)
RBC: 3.84 MIL/uL — ABNORMAL LOW (ref 4.22–5.81)
RDW: 14.2 % (ref 11.5–15.5)
WBC: 9.7 10*3/uL (ref 4.0–10.5)
nRBC: 0 % (ref 0.0–0.2)

## 2020-12-27 LAB — GASTROINTESTINAL PANEL BY PCR, STOOL (REPLACES STOOL CULTURE)

## 2020-12-27 LAB — PROTIME-INR
INR: 2.5 — ABNORMAL HIGH (ref 0.8–1.2)
Prothrombin Time: 26.8 seconds — ABNORMAL HIGH (ref 11.4–15.2)

## 2020-12-27 MED ORDER — DICYCLOMINE HCL 10 MG PO CAPS
10.0000 mg | ORAL_CAPSULE | Freq: Three times a day (TID) | ORAL | Status: DC
  Administered 2020-12-27 – 2020-12-30 (×10): 10 mg via ORAL
  Filled 2020-12-27 (×15): qty 1

## 2020-12-27 MED ORDER — LAMOTRIGINE 25 MG PO TABS
25.0000 mg | ORAL_TABLET | Freq: Two times a day (BID) | ORAL | Status: DC
  Administered 2020-12-27: 25 mg via ORAL
  Filled 2020-12-27: qty 1

## 2020-12-27 MED ORDER — LOPERAMIDE HCL 2 MG PO CAPS
4.0000 mg | ORAL_CAPSULE | Freq: Three times a day (TID) | ORAL | Status: DC | PRN
  Administered 2020-12-27: 4 mg via ORAL
  Filled 2020-12-27: qty 2

## 2020-12-27 MED ORDER — GABAPENTIN 400 MG PO CAPS
1300.0000 mg | ORAL_CAPSULE | Freq: Every day | ORAL | Status: DC
  Administered 2020-12-27: 1300 mg via ORAL
  Filled 2020-12-27: qty 1

## 2020-12-27 MED ORDER — LAMOTRIGINE 25 MG PO TABS
75.0000 mg | ORAL_TABLET | Freq: Two times a day (BID) | ORAL | Status: DC
  Administered 2020-12-27 – 2020-12-30 (×6): 75 mg via ORAL
  Filled 2020-12-27 (×6): qty 3

## 2020-12-27 MED ORDER — WARFARIN SODIUM 2.5 MG PO TABS
2.5000 mg | ORAL_TABLET | Freq: Once | ORAL | Status: AC
  Administered 2020-12-27: 2.5 mg via ORAL
  Filled 2020-12-27: qty 1

## 2020-12-27 MED ORDER — CHLORHEXIDINE GLUCONATE CLOTH 2 % EX PADS
6.0000 | MEDICATED_PAD | Freq: Every day | CUTANEOUS | Status: DC
  Administered 2020-12-27 – 2020-12-29 (×3): 6 via TOPICAL

## 2020-12-27 NOTE — ED Notes (Signed)
Pt in bed, pt has small soft/watery bm, cleaned pt, pt has some redness to his sacrum, turned pt onto L side.  Pt denies pain.

## 2020-12-27 NOTE — Progress Notes (Signed)
[place] for warfarin Indication:  antiphospholipid syndrome  Allergies  Allergen Reactions   Hydroxychloroquine Other (See Comments)    Other reaction(s): Other (See Comments)  Causing blindness Visual problems     Patient Measurements: Height: 6\' 5"  (195.6 cm) Weight: 90.7 kg (200 lb) IBW/kg (Calculated) : 89.1   Vital Signs: Temp: 98.2 F (36.8 C) (11/21 1042) Temp Source: Oral (11/21 1042) BP: 106/80 (11/21 1042) Pulse Rate: 55 (11/21 1042)  Labs: Recent Labs    12/26/20 1021 12/26/20 1243 12/27/20 0441 12/27/20 0554  HGB 13.7  --  11.3*  --   HCT 43.1  --  35.9*  --   PLT 150  --  153  --   LABPROT  --  23.5*  --  26.8*  INR  --  2.1*  --  2.5*  CREATININE 1.59*  --  1.34*  --      Estimated Creatinine Clearance: 59.1 mL/min (A) (by C-G formula based on SCr of 1.34 mg/dL (H)).   Medical History: Past Medical History:  Diagnosis Date   Actinic keratosis    Alcohol abuse    APS (antiphospholipid syndrome) (HCC)    Basal cell carcinoma    BPH (benign prostatic hyperplasia)    CAD (coronary artery disease)    Cellulitis    Cervical spinal stenosis    Chronic osteomyelitis (HCC)    CKD (chronic kidney disease)    CKD (chronic kidney disease)    Clostridium difficile diarrhea    Depression    Diplopia    Fatigue    GERD (gastroesophageal reflux disease)    Hyperlipemia    Hypertension    Hypovitaminosis D    IBS (irritable bowel syndrome)    IgA deficiency (HCC)    Insomnia    Left lumbosacral radiculopathy    Moderate obstructive sleep apnea    Osteomyelitis of foot (HCC)    PAD (peripheral artery disease) (HCC)    Pruritus    RA (rheumatoid arthritis) (HCC)    Radiculopathy    Restless legs syndrome    RLS (restless legs syndrome)    SLE (systemic lupus erythematosus related syndrome) (HCC)    Squamous cell carcinoma of skin 01/27/2019   right mid lower ear helix   Stroke (HCC)    Toxic  maculopathy of both eyes     Medications:  Scheduled:   aspirin EC  81 mg Oral Daily   FLUoxetine  40 mg Oral Daily   gabapentin  1,200 mg Oral QHS   predniSONE  10 mg Oral Q breakfast   QUEtiapine  25 mg Oral QHS   Warfarin - Pharmacist Dosing Inpatient   Does not apply q1600   Infusions:   sodium chloride 75 mL/hr at 12/26/20 1558   piperacillin-tazobactam (ZOSYN)  IV 3.375 g (12/27/20 0559)   sodium chloride     PRN: acetaminophen, albuterol, dextromethorphan-guaiFENesin, morphine injection, olopatadine, ondansetron (ZOFRAN) IV, rOPINIRole, zolpidem Anti-infectives (From admission, onward)    Start     Dose/Rate Route Frequency Ordered Stop   12/26/20 2200  piperacillin-tazobactam (ZOSYN) IVPB 3.375 g        3.375 g 12.5 mL/hr over 240 Minutes Intravenous Every 8 hours 12/26/20 1816     12/26/20 1345  ceFEPIme (MAXIPIME) 2 g in sodium chloride 0.9 % 100 mL IVPB        2 g 200 mL/hr over 30 Minutes Intravenous  Once 12/26/20 1333 12/26/20 1445   12/26/20 1345  metroNIDAZOLE (  FLAGYL) IVPB 500 mg        500 mg 100 mL/hr over 60 Minutes Intravenous  Once 12/26/20 1333 12/26/20 1553   12/26/20 1345  vancomycin (VANCOCIN) IVPB 1000 mg/200 mL premix        1,000 mg 200 mL/hr over 60 Minutes Intravenous  Once 12/26/20 1333 12/26/20 1626       Assessment: 75YOM PMH triple positive APS (+aCL IgG/IgM, +, lupus anticoagulant, +anti-beta2 glycoprotein IgG) CVA, CAD, aortic atherosclerosis, HTN, HLD, CKD Stage 3, on warfarin PTA. Patient presenting to ED with diarrhea x 6 weeks. Per notes from hematology (Dr. Dionne Milo at [facility]) patient's INR goal is 2-3.  INR from most recent outpatient visits (11/29/20-12/20/20) have been > 2 (INRs ranging 2.2-3.2).  BL CBC stable-- Hgb 13.7, Platelets WNL  PTA schedule:  warfarin 2.5 mg Tues, Wed, Thurs, Sat, Sun warfarin 5 mg Mon, Fri   Date INR Warfarin Dose  11/19 -- PTA warfarin 2.5 mg  11/20 2.1 Warfarin 5 mg  11/21 2.5     DDI: Abx and  prednisone PTA aspirin 81 mg daily PTA quetiapine: enhanced anticoagulant effect   Goal of Therapy:  INR 2-3 Monitor platelets by anticoagulation protocol: Yes   Plan:  INR within goal. Ensure patient maintains therapeutic INR given high risk of thrombosis.  Give warfarin 2.5 mg tonight.  Daily CBC, INR.   Thedora Rings Rodriguez-Guzman PharmD, BCPS 12/27/2020 12:31 PM

## 2020-12-27 NOTE — Progress Notes (Signed)
Patient ID: Kenneth Summers, male   DOB: 1945-02-10, 75 y.o.   MRN: [id number] [facility] PROGRESS NOTE  Kenneth Summers [id number] DOB: 11/12/1945 DOA: 12/26/2020 PCP: Toni Arthurs, NP  HPI/Subjective: Patient states that has been having diarrhea for going on for about 2 weeks.  Too many times to count.  Had low-grade fever coming in.  Objective: Vitals:   12/27/20 1042 12/27/20 1300  BP: 106/80 127/81  Pulse: (!) 55 (!) 51  Resp: 17 13  Temp: 98.2 F (36.8 C) 98.7 F (37.1 C)  SpO2: 100% 95%    Intake/Output Summary (Last 24 hours) at 12/27/2020 1307 Last data filed at 12/27/2020 0934 Gross per 24 hour  Intake 550 ml  Output 1500 ml  Net -950 ml   Filed Weights   12/26/20 1003  Weight: 90.7 kg    ROS: Review of Systems  Respiratory:  Negative for shortness of breath.   Cardiovascular:  Negative for chest pain.  Gastrointestinal:  Positive for diarrhea. Negative for abdominal pain, nausea and vomiting.  Exam: Physical Exam HENT:     Head: Normocephalic.     Mouth/Throat:     Pharynx: No oropharyngeal exudate.  Eyes:     General: Lids are normal.     Conjunctiva/sclera: Conjunctivae normal.  Cardiovascular:     Rate and Rhythm: Normal rate and regular rhythm.     Heart sounds: Normal heart sounds, S1 normal and S2 normal.  Pulmonary:     Breath sounds: Normal breath sounds. No decreased breath sounds, wheezing, rhonchi or rales.  Abdominal:     Palpations: Abdomen is soft.     Tenderness: There is no abdominal tenderness.  Musculoskeletal:     Right lower leg: Swelling present.     Left lower leg: Swelling present.  Skin:    General: Skin is warm.     Comments: Blister left medial heel.  Blister right heel  Neurological:     Mental Status: He is alert and oriented to person, place, and time.     Comments: Left leg paralysis      Scheduled Meds:  aspirin EC  81 mg Oral Daily   FLUoxetine  40 mg Oral Daily   gabapentin  1,200 mg Oral  QHS   predniSONE  10 mg Oral Q breakfast   QUEtiapine  25 mg Oral QHS   warfarin  2.5 mg Oral ONCE-1600   Warfarin - Pharmacist Dosing Inpatient   Does not apply q1600   Continuous Infusions:  sodium chloride 75 mL/hr at 12/26/20 1558   piperacillin-tazobactam (ZOSYN)  IV 3.375 g (12/27/20 0559)   sodium chloride      Assessment/Plan:  Clinical sepsis with acute colitis seen on CT scan.  Acute on chronic diarrhea.  Started on empiric Zosyn.  Stool for C. difficile and stool comprehensive panel negative.  We will get abdominal x-ray tomorrow.  Since this has been going on for a while with the patient we will give a trial of Bentyl.  As needed Imodium. Bradycardia hold Tenormin Acute kidney injury on CKD stage IIIa.  Patient on IV fluids.  Holding Cozaar.  Creatinine 1.59 on presentation and down to 1.34 today.  Baseline creatinine 1.18. Essential hypertension.  Blood pressure stable holding Cozaar with acute kidney injury and holding Tenormin with bradycardia History of stroke on aspirin Antiphospholipid syndrome on Coumadin Bilateral heel blisters.  Continue to monitor Depression on fluoxetine Seizure disorder on gabapentin and Lamictal History of RA on chronic prednisone  Code Status:     Code Status Orders  (From admission, onward)           Start     Ordered   12/26/20 1602  Do not attempt resuscitation (DNR)  Continuous        12/26/20 1601           Code Status History     Date Active Date Inactive Code Status Order ID Comments User Context   12/26/2020 1502 12/26/2020 1601 Full Code [id number]  Ivor Costa, MD ED   10/18/2018 1549 10/21/2018 1755 DNR [id number]  Henreitta Leber, MD ED   10/18/2018 1548 10/18/2018 1549 Full Code [id number]  Henreitta Leber, MD ED      Family Communication: Spoke with daughter on the phone Disposition Plan: Status is: Inpatient  Antibiotics: Zosyn  Time spent: 29 minutes  [place]

## 2020-12-28 ENCOUNTER — Inpatient Hospital Stay: Payer: Medicare Other

## 2020-12-28 DIAGNOSIS — R5383 Other fatigue: Secondary | ICD-10-CM | POA: Diagnosis not present

## 2020-12-28 DIAGNOSIS — K529 Noninfective gastroenteritis and colitis, unspecified: Secondary | ICD-10-CM | POA: Diagnosis not present

## 2020-12-28 DIAGNOSIS — R197 Diarrhea, unspecified: Secondary | ICD-10-CM | POA: Diagnosis not present

## 2020-12-28 DIAGNOSIS — L8990 Pressure ulcer of unspecified site, unspecified stage: Secondary | ICD-10-CM | POA: Insufficient documentation

## 2020-12-28 DIAGNOSIS — A419 Sepsis, unspecified organism: Secondary | ICD-10-CM | POA: Diagnosis not present

## 2020-12-28 LAB — BASIC METABOLIC PANEL
Anion gap: 5 (ref 5–15)
BUN: 29 mg/dL — ABNORMAL HIGH (ref 8–23)
CO2: 23 mmol/L (ref 22–32)
Calcium: 8.1 mg/dL — ABNORMAL LOW (ref 8.9–10.3)
Chloride: 109 mmol/L (ref 98–111)
Creatinine, Ser: 1.17 mg/dL (ref 0.61–1.24)
GFR, Estimated: >60 mL/min (ref >60)
Glucose, Bld: 102 mg/dL — ABNORMAL HIGH (ref 70–99)
Potassium: 3.5 mmol/L (ref 3.5–5.1)
Sodium: 137 mmol/L (ref 135–145)

## 2020-12-28 LAB — CBC
HCT: 34.3 % — ABNORMAL LOW (ref 39.0–52.0)
Hemoglobin: 11.3 g/dL — ABNORMAL LOW (ref 13.0–17.0)
MCH: 29.9 pg (ref 26.0–34.0)
MCHC: 32.9 g/dL (ref 30.0–36.0)
MCV: 90.7 fL (ref 80.0–100.0)
Platelets: 165 10*3/uL (ref 150–400)
RBC: 3.78 MIL/uL — ABNORMAL LOW (ref 4.22–5.81)
RDW: 14.2 % (ref 11.5–15.5)
WBC: 8.6 10*3/uL (ref 4.0–10.5)
nRBC: 0 % (ref 0.0–0.2)

## 2020-12-28 LAB — PROTIME-INR
INR: 2.6 — ABNORMAL HIGH (ref 0.8–1.2)
Prothrombin Time: 27.5 seconds — ABNORMAL HIGH (ref 11.4–15.2)

## 2020-12-28 MED ORDER — GABAPENTIN 600 MG PO TABS
1200.0000 mg | ORAL_TABLET | Freq: Every day | ORAL | Status: DC
  Administered 2020-12-28 – 2020-12-29 (×2): 1200 mg via ORAL
  Filled 2020-12-28 (×2): qty 2

## 2020-12-28 MED ORDER — ROPINIROLE HCL 1 MG PO TABS
2.0000 mg | ORAL_TABLET | Freq: Every day | ORAL | Status: DC
  Administered 2020-12-28 – 2020-12-29 (×2): 2 mg via ORAL
  Filled 2020-12-28 (×2): qty 2

## 2020-12-28 MED ORDER — QUETIAPINE FUMARATE 25 MG PO TABS
12.5000 mg | ORAL_TABLET | Freq: Every day | ORAL | Status: DC
  Administered 2020-12-28 – 2020-12-29 (×2): 12.5 mg via ORAL
  Filled 2020-12-28 (×2): qty 1

## 2020-12-28 MED ORDER — WARFARIN SODIUM 2.5 MG PO TABS
2.5000 mg | ORAL_TABLET | Freq: Once | ORAL | Status: AC
  Administered 2020-12-28: 2.5 mg via ORAL
  Filled 2020-12-28: qty 1

## 2020-12-28 NOTE — Progress Notes (Signed)
[place] for warfarin Indication:  antiphospholipid syndrome  Allergies  Allergen Reactions   Hydroxychloroquine Other (See Comments)    Other reaction(s): Other (See Comments)  Causing blindness Visual problems     Patient Measurements: Height: 6\' 5"  (195.6 cm) Weight: 90.7 kg (200 lb) IBW/kg (Calculated) : 89.1   Vital Signs: Temp: 97.8 F (36.6 C) (11/22 0415) Temp Source: Oral (11/22 0415) BP: 146/70 (11/22 0415) Pulse Rate: 61 (11/22 0415)  Labs: Recent Labs    12/26/20 1021 12/26/20 1243 12/27/20 0441 12/27/20 0554 12/28/20 0257  HGB 13.7  --  11.3*  --  11.3*  HCT 43.1  --  35.9*  --  34.3*  PLT 150  --  153  --  165  LABPROT  --  23.5*  --  26.8* 27.5*  INR  --  2.1*  --  2.5* 2.6*  CREATININE 1.59*  --  1.34*  --  1.17     Estimated Creatinine Clearance: 67.7 mL/min (by C-G formula based on SCr of 1.17 mg/dL).   Medical History: Past Medical History:  Diagnosis Date   Actinic keratosis    Alcohol abuse    APS (antiphospholipid syndrome) (HCC)    Basal cell carcinoma    BPH (benign prostatic hyperplasia)    CAD (coronary artery disease)    Cellulitis    Cervical spinal stenosis    Chronic osteomyelitis (HCC)    CKD (chronic kidney disease)    CKD (chronic kidney disease)    Clostridium difficile diarrhea    Depression    Diplopia    Fatigue    GERD (gastroesophageal reflux disease)    Hyperlipemia    Hypertension    Hypovitaminosis D    IBS (irritable bowel syndrome)    IgA deficiency (HCC)    Insomnia    Left lumbosacral radiculopathy    Moderate obstructive sleep apnea    Osteomyelitis of foot (HCC)    PAD (peripheral artery disease) (HCC)    Pruritus    RA (rheumatoid arthritis) (HCC)    Radiculopathy    Restless legs syndrome    RLS (restless legs syndrome)    SLE (systemic lupus erythematosus related syndrome) (HCC)    Squamous cell carcinoma of skin 01/27/2019   right mid lower ear  helix   Stroke (HCC)    Toxic maculopathy of both eyes     Medications:  Scheduled:   aspirin EC  81 mg Oral Daily   Chlorhexidine Gluconate Cloth  6 each Topical Daily   dicyclomine  10 mg Oral TID AC & HS   FLUoxetine  40 mg Oral Daily   gabapentin  1,300 mg Oral QHS   lamoTRIgine  75 mg Oral BID   predniSONE  10 mg Oral Q breakfast   QUEtiapine  25 mg Oral QHS   Warfarin - Pharmacist Dosing Inpatient   Does not apply q1600   Infusions:   sodium chloride 75 mL/hr at 12/27/20 2225   piperacillin-tazobactam (ZOSYN)  IV 3.375 g (12/28/20 0546)   sodium chloride     PRN: acetaminophen, albuterol, dextromethorphan-guaiFENesin, loperamide, morphine injection, olopatadine, ondansetron (ZOFRAN) IV, rOPINIRole, zolpidem Anti-infectives (From admission, onward)    Start     Dose/Rate Route Frequency Ordered Stop   12/26/20 2200  piperacillin-tazobactam (ZOSYN) IVPB 3.375 g        3.375 g 12.5 mL/hr over 240 Minutes Intravenous Every 8 hours 12/26/20 1816     12/26/20 1345  ceFEPIme (MAXIPIME) 2  g in sodium chloride 0.9 % 100 mL IVPB        2 g 200 mL/hr over 30 Minutes Intravenous  Once 12/26/20 1333 12/26/20 1445   12/26/20 1345  metroNIDAZOLE (FLAGYL) IVPB 500 mg        500 mg 100 mL/hr over 60 Minutes Intravenous  Once 12/26/20 1333 12/26/20 1553   12/26/20 1345  vancomycin (VANCOCIN) IVPB 1000 mg/200 mL premix        1,000 mg 200 mL/hr over 60 Minutes Intravenous  Once 12/26/20 1333 12/26/20 1626       Assessment: 75YOM PMH triple positive APS (+aCL IgG/IgM, +, lupus anticoagulant, +anti-beta2 glycoprotein IgG) CVA, CAD, aortic atherosclerosis, HTN, HLD, CKD Stage 3, on warfarin PTA. Patient presenting to ED with diarrhea x 6 weeks. Per notes from hematology (Dr. Dionne Milo at [facility]) patient's INR goal is 2-3.  INR from most recent outpatient visits (11/29/20-12/20/20) have been > 2 (INRs ranging 2.2-3.2).  PTA schedule:  warfarin 2.5 mg Tues, Wed, Thurs, Sat, Sun warfarin 5 mg  Mon, Fri  DDI: Abx and prednisone PTA aspirin 81 mg daily PTA quetiapine: enhanced anticoagulant effect   Goal of Therapy:  INR 2-3 Monitor platelets by anticoagulation protocol: Yes   Plan:  INR within goal. Ensure patient maintains therapeutic INR given high risk of thrombosis.  Give warfarin 2.5 mg tonight, which is equal to home dose Daily CBC, INR.   Vallery Sa, PharmD, BCPS 12/28/2020 7:19 AM

## 2020-12-28 NOTE — Progress Notes (Signed)
PT Cancellation Note  Patient Details Name: Kenneth Summers MRN: [id number] DOB: 1945/06/05   Cancelled Treatment:    Reason Eval/Treat Not Completed: Fatigue/lethargy limiting ability to participate.  Pt chart reviewed and attempted to see pt x2.  Pt stated he was too fatigued/tired to work with therapy on first attempt and second attempt was difficult to awake and orient for therapy.  According to nursing medications were being adjusted to elevated alertness going forward.  Will attempt evaluation at later date/time as medically appropriate.    Gwenlyn Saran, PT, DPT 12/28/20, 4:21 PM

## 2020-12-28 NOTE — Progress Notes (Signed)
Patient ID: Kenneth Summers, male   DOB: 1945-04-25, 75 y.o.   MRN: [id number] [facility] PROGRESS NOTE  MELANIE PELLOT KWI:[id number] DOB: [birth year] DOA: 12/26/2020 PCP: Toni Arthurs, NP  HPI/Subjective: Patient seen this morning and he was awakened from sleep.  Patient took a little while to reorient but was not as sharp as he was yesterday.  The patient's daughter states that some days he is like this and is normally sleeping till 63 AM.  But since he still like this this afternoon she was concerned.  Objective: Vitals:   12/28/20 0415 12/28/20 0738  BP: (!) 146/70 (!) 161/58  Pulse: 61 61  Resp: 18 18  Temp: 97.8 F (36.6 C) 98.6 F (37 C)  SpO2: 95% 98%    Intake/Output Summary (Last 24 hours) at 12/28/2020 1315 Last data filed at 12/28/2020 1100 Gross per 24 hour  Intake 1755.54 ml  Output 1100 ml  Net 655.54 ml   Filed Weights   12/26/20 1003  Weight: 90.7 kg    ROS: Review of Systems  Respiratory:  Negative for shortness of breath.   Cardiovascular:  Negative for chest pain.  Gastrointestinal:  Negative for abdominal pain, nausea and vomiting.  Exam: Physical Exam HENT:     Head: Normocephalic.     Mouth/Throat:     Pharynx: No oropharyngeal exudate.  Eyes:     General: Lids are normal.     Conjunctiva/sclera: Conjunctivae normal.  Cardiovascular:     Rate and Rhythm: Normal rate and regular rhythm.     Heart sounds: Normal heart sounds, S1 normal and S2 normal.  Pulmonary:     Breath sounds: No decreased breath sounds, wheezing, rhonchi or rales.  Abdominal:     Palpations: Abdomen is soft.     Tenderness: There is no abdominal tenderness.  Musculoskeletal:     Right lower leg: Swelling present.     Left lower leg: Swelling present.  Skin:    General: Skin is warm.     Findings: No rash.  Neurological:     Mental Status: He is lethargic.      Scheduled Meds:  aspirin EC  81 mg Oral Daily   Chlorhexidine Gluconate Cloth  6 each  Topical Daily   dicyclomine  10 mg Oral TID AC & HS   FLUoxetine  40 mg Oral Daily   gabapentin  1,200 mg Oral QHS   lamoTRIgine  75 mg Oral BID   predniSONE  10 mg Oral Q breakfast   QUEtiapine  12.5 mg Oral QHS   rOPINIRole  2 mg Oral QHS   warfarin  2.5 mg Oral ONCE-1600   Warfarin - Pharmacist Dosing Inpatient   Does not apply q1600   Continuous Infusions:  sodium chloride 75 mL/hr at 12/27/20 2225   piperacillin-tazobactam (ZOSYN)  IV 3.375 g (12/28/20 1242)   sodium chloride     Brief history.  75 year old man with history of stroke and left leg paralysis.  Basically bedbound.  History of antiphospholipid syndrome, BPH, CAD, CKD, IgA deficiency, PAD, rheumatoid arthritis.  He presents with uncontrollable diarrhea.  Stool studies negative.  CT scan concerning for acute colitis.  Started on antibiotics.  No diarrhea today so far but had frequent diarrhea yesterday.  Started on Bentyl.  Patient a little more lethargic today and this afternoon.  We will get a CT scan of the head.  Assessment/Plan:  Clinical sepsis with acute colitis seen on CT scan.  Acute on chronic diarrhea.  Started on empiric Zosyn.  Stool studies negative.  Trial of Bentyl and as needed Imodium since stool studies negative. Lethargy.  We will get a CT scan of the head.  Decrease dose of Seroquel to 12.5 mg at night and decrease gabapentin to 1200 mg at night.  In speaking with the patient's daughter sometimes he does have days like this where he sleeping.  Could also be that he was in the emergency room for 2 days. Acute kidney injury on chronic kidney disease stage IIIa.  Creatinine improved from 1.59 down to 1.17. Bradycardia holding Tenormin Essential hypertension.  Held the morning secondary to bradycardia.  Initially held Cozaar with acute kidney injury.  If blood pressure continues to rise can restart Cozaar at this point. History of stroke on aspirin. Antiphospholipid syndrome on Coumadin Bilateral heel  blisters Depression on fluoxetine Restless leg syndrome on gabapentin and Requip Seizure disorder on Lamictal History of RA on chronic prednisone  Pressure Injury Ankle Anterior;Left Stage 2 -  Partial thickness loss of dermis presenting as a shallow open injury with a red, pink wound bed without slough. Blister that has already drained (Active)     Location: Ankle  Location Orientation: Anterior;Left  Staging: Stage 2 -  Partial thickness loss of dermis presenting as a shallow open injury with a red, pink wound bed without slough.  Wound Description (Comments): Blister that has already drained  Present on Admission: Yes     Pressure Injury 12/27/20 Heel Right;Mid Stage 1 -  Intact skin with non-blanchable redness of a localized area usually over a bony prominence. Unbroken blister serous filled (Active)  12/27/20 1600  Location: Heel  Location Orientation: Right;Mid  Staging: Stage 1 -  Intact skin with non-blanchable redness of a localized area usually over a bony prominence.  Wound Description (Comments): Unbroken blister serous filled  Present on Admission: Yes       Code Status:     Code Status Orders  (From admission, onward)           Start     Ordered   12/26/20 1602  Do not attempt resuscitation (DNR)  Continuous        12/26/20 1601           Code Status History     Date Active Date Inactive Code Status Order ID Comments User Context   12/26/2020 1502 12/26/2020 1601 Full Code [id number]  Ivor Costa, MD ED   10/18/2018 1549 10/21/2018 1755 DNR [id number]  Henreitta Leber, MD ED   10/18/2018 1548 10/18/2018 1549 Full Code [id number]  Henreitta Leber, MD ED      Family Communication: Updated patient's daughter on the phone Disposition Plan: Status is: Inpatient.  If mental status improved back to baseline and diarrhea settles down then potentially home tomorrow  Antibiotics: Zosyn  Time spent: 27 minutes  [place]

## 2020-12-28 NOTE — Progress Notes (Addendum)
Patient has a red coloration present to his sacral area, the area is easily blanchable. Sacral foam protection applied. Area has a few excoriation marks. MD [place] notified and aware.

## 2020-12-28 NOTE — Evaluation (Signed)
Occupational Therapy Evaluation Patient Details Name: Kenneth Summers MRN: [id number] DOB: August 04, 1945 Today's Date: 12/28/2020   History of Present Illness Pt. is a 75 y.o male who was admitted to [facility] with Clinical Sepsis with Acute colitis. PMHx includes: CVA with Left sided weakness, Bilateral heel blisters, Depression, Seizure DIsorder.   Clinical Impression   Pt. Presents with lethargy, weakness, limited activity tolerance, history of left sided visual impairment, and limited functional mobility which limits his ability to complete basic ADL and IADL functioning. Pt. Resides at home with his wife. Pt.'s daughter is very supportive. Pt. has daily home care aides who assist with ADLs, and IADL tasks. Pt. Used a power w/c, however was mostly bed/recliner bound. Pt. Required assist with meal preparation, medication management, and performing home management, and household tasks. Pt. Was lethargic with intermittent periods of wakefulness. Pt. requires TotalA for ADLs, bed mobility/rolling. Pt. Was assisted with ROM, and repositioning. Pt. Has bilateral unaboots in place secondary to heel blisters. Pt. Could benefit from OT services for ADL training, A/E training, neuromuscular re-ed, there. Ex,  and pt./caregiver education about positioning, visual compensatory strategies, home modification, and DME. Pt. would benefit from SNF level of care upon discharge, or home with caregiver support, and follow-up OT services.     Recommendations for follow up therapy are one component of a multi-disciplinary discharge planning process, led by the attending physician.  Recommendations may be updated based on patient status, additional functional criteria and insurance authorization.   Follow Up Recommendations  Skilled nursing-short term rehab (<3 hours/day)    Assistance Recommended at Discharge    Functional Status Assessment  Patient has had a recent decline in their functional status and/or demonstrates  limited ability to make significant improvements in function in a reasonable and predictable amount of time  Equipment Recommendations       Recommendations for Other Services       Precautions / Restrictions Precautions Precautions: Fall Restrictions Weight Bearing Restrictions: No      Mobility Bed Mobility Overal bed mobility: Needs Assistance Bed Mobility: Rolling Rolling: Total assist              Transfers    Deferred/unable                      Balance                                           ADL either performed or assessed with clinical judgement   ADL Overall ADL's : Needs assistance/impaired     Grooming: Maximal assistance   Upper Body Bathing: Total assistance   Lower Body Bathing: Total assistance   Upper Body Dressing : Total assistance   Lower Body Dressing: Total assistance   Toilet Transfer: Total assistance   Toileting- Clothing Manipulation and Hygiene: Total assistance               Vision   Vision Assessment?: Vision impaired- to be further tested in functional context Additional Comments: History of left sided visual field impairment     Perception     Praxis      Pertinent Vitals/Pain Pain Assessment: No/denies pain     Hand Dominance Right   Extremity/Trunk Assessment Upper Extremity Assessment Upper Extremity Assessment: Generalized weakness (Genera;ized weakness in the right, Left to be further assessed 2/2 level of arousal when attempting  to assess the LUE)           Communication Communication Communication: No difficulties   Cognition Arousal/Alertness: Lethargic Behavior During Therapy: Flat affect Overall Cognitive Status: Difficult to assess                                       General Comments       Exercises     Shoulder Instructions      Home Living Family/patient expects to be discharged to:: Private residence Living Arrangements:  Spouse/significant other (supportive family) Available Help at Discharge: Family Type of Home: House                       Home Equipment: Wheelchair - power          Prior Functioning/Environment Prior Level of Function : Needs assist;Patient poor historian/Family not available             Mobility Comments: Bedbound, power w/c ADLs Comments: Pt. requires assist with ADLs/IADLs from daily homecare aides. Pt. has very supportive family        OT Problem List: Decreased strength;Impaired UE functional use;Decreased range of motion;Decreased coordination;Decreased activity tolerance;Decreased cognition      OT Treatment/Interventions: Self-care/ADL training;Patient/family education;DME and/or AE instruction;Therapeutic exercise;Neuromuscular education;Therapeutic activities;Visual/perceptual remediation/compensation    OT Goals(Current goals can be found in the care plan section) Acute Rehab OT Goals OT Goal Formulation: Patient unable to participate in goal setting Time For Goal Achievement: 01/14/21 Potential to Achieve Goals: Fair  OT Frequency: Min 2X/week   Barriers to D/C:            Co-evaluation              AM-PAC OT "6 Clicks" Daily Activity     Outcome Measure   Help from another person taking care of personal grooming?: A Lot Help from another person toileting, which includes using toliet, bedpan, or urinal?: Total Help from another person bathing (including washing, rinsing, drying)?: Total Help from another person to put on and taking off regular upper body clothing?: Total Help from another person to put on and taking off regular lower body clothing?: Total 6 Click Score: 6   End of Session    Activity Tolerance: Patient limited by lethargy Patient left: in bed  OT Visit Diagnosis: Muscle weakness (generalized) (M62.81)                Time: 2876-8115 OT Time Calculation (min): 15 min Charges:  OT General Charges $OT Visit: 1  Visit OT Evaluation $OT Eval Low Complexity: 1 Low Harrel Carina, MS, OTR/L   Harrel Carina 12/28/2020, 12:07 PM

## 2020-12-29 ENCOUNTER — Encounter: Payer: Medicare Other | Admitting: Occupational Therapy

## 2020-12-29 ENCOUNTER — Ambulatory Visit: Payer: Medicare Other

## 2020-12-29 DIAGNOSIS — N179 Acute kidney failure, unspecified: Secondary | ICD-10-CM | POA: Diagnosis not present

## 2020-12-29 DIAGNOSIS — D6861 Antiphospholipid syndrome: Secondary | ICD-10-CM | POA: Diagnosis not present

## 2020-12-29 DIAGNOSIS — N1831 Chronic kidney disease, stage 3a: Secondary | ICD-10-CM | POA: Diagnosis not present

## 2020-12-29 DIAGNOSIS — K529 Noninfective gastroenteritis and colitis, unspecified: Secondary | ICD-10-CM | POA: Diagnosis not present

## 2020-12-29 LAB — PROTIME-INR
INR: 2.3 — ABNORMAL HIGH (ref 0.8–1.2)
Prothrombin Time: 25.7 seconds — ABNORMAL HIGH (ref 11.4–15.2)

## 2020-12-29 MED ORDER — ATENOLOL 25 MG PO TABS
12.5000 mg | ORAL_TABLET | Freq: Every day | ORAL | Status: DC
Start: 2020-12-29 — End: 2023-07-29

## 2020-12-29 MED ORDER — WARFARIN SODIUM 2.5 MG PO TABS
2.5000 mg | ORAL_TABLET | Freq: Once | ORAL | Status: AC
  Administered 2020-12-29: 2.5 mg via ORAL
  Filled 2020-12-29: qty 1

## 2020-12-29 MED ORDER — CIPROFLOXACIN HCL 500 MG PO TABS: 500.0000 mg | ORAL_TABLET | Freq: Two times a day (BID) | ORAL | 0 refills | Status: AC

## 2020-12-29 MED ORDER — METRONIDAZOLE 500 MG PO TABS: 500.0000 mg | ORAL_TABLET | Freq: Three times a day (TID) | ORAL | 0 refills | Status: AC

## 2020-12-29 NOTE — Progress Notes (Signed)
Dressings changed as ordered. Pt tolerated well. Will continue to monitor.

## 2020-12-29 NOTE — Progress Notes (Signed)
[place] for warfarin Indication:  antiphospholipid syndrome  Allergies  Allergen Reactions   Hydroxychloroquine Other (See Comments)    Other reaction(s): Other (See Comments)  Causing blindness Visual problems     Patient Measurements: Height: 6\' 5"  (195.6 cm) Weight: 90.7 kg (200 lb) IBW/kg (Calculated) : 89.1   Vital Signs: Temp: 98.2 F (36.8 C) (11/23 0725) Temp Source: Oral (11/23 0725) BP: 175/76 (11/23 0725) Pulse Rate: 55 (11/23 0725)  Labs: Recent Labs    12/26/20 1021 12/26/20 1243 12/27/20 0441 12/27/20 0554 12/28/20 0257 12/29/20 0327  HGB 13.7  --  11.3*  --  11.3*  --   HCT 43.1  --  35.9*  --  34.3*  --   PLT 150  --  153  --  165  --   LABPROT  --    < >  --  26.8* 27.5* 25.7*  INR  --    < >  --  2.5* 2.6* 2.3*  CREATININE 1.59*  --  1.34*  --  1.17  --    < > = values in this interval not displayed.     Estimated Creatinine Clearance: 67.7 mL/min (by C-G formula based on SCr of 1.17 mg/dL).   Medical History: Past Medical History:  Diagnosis Date   Actinic keratosis    Alcohol abuse    APS (antiphospholipid syndrome) (HCC)    Basal cell carcinoma    BPH (benign prostatic hyperplasia)    CAD (coronary artery disease)    Cellulitis    Cervical spinal stenosis    Chronic osteomyelitis (HCC)    CKD (chronic kidney disease)    CKD (chronic kidney disease)    Clostridium difficile diarrhea    Depression    Diplopia    Fatigue    GERD (gastroesophageal reflux disease)    Hyperlipemia    Hypertension    Hypovitaminosis D    IBS (irritable bowel syndrome)    IgA deficiency (HCC)    Insomnia    Left lumbosacral radiculopathy    Moderate obstructive sleep apnea    Osteomyelitis of foot (HCC)    PAD (peripheral artery disease) (HCC)    Pruritus    RA (rheumatoid arthritis) (HCC)    Radiculopathy    Restless legs syndrome    RLS (restless legs syndrome)    SLE (systemic lupus erythematosus  related syndrome) (HCC)    Squamous cell carcinoma of skin 01/27/2019   right mid lower ear helix   Stroke (HCC)    Toxic maculopathy of both eyes     Medications:  Scheduled:   aspirin EC  81 mg Oral Daily   Chlorhexidine Gluconate Cloth  6 each Topical Daily   dicyclomine  10 mg Oral TID AC & HS   FLUoxetine  40 mg Oral Daily   gabapentin  1,200 mg Oral QHS   lamoTRIgine  75 mg Oral BID   predniSONE  10 mg Oral Q breakfast   QUEtiapine  12.5 mg Oral QHS   rOPINIRole  2 mg Oral QHS   Warfarin - Pharmacist Dosing Inpatient   Does not apply q1600   Infusions:   sodium chloride 30 mL/hr at 12/28/20 1300   piperacillin-tazobactam (ZOSYN)  IV 3.375 g (12/29/20 0601)   sodium chloride     PRN: acetaminophen, albuterol, dextromethorphan-guaiFENesin, loperamide, olopatadine, ondansetron (ZOFRAN) IV, zolpidem Anti-infectives (From admission, onward)    Start     Dose/Rate Route Frequency Ordered Stop  12/26/20 2200  piperacillin-tazobactam (ZOSYN) IVPB 3.375 g        3.375 g 12.5 mL/hr over 240 Minutes Intravenous Every 8 hours 12/26/20 1816     12/26/20 1345  ceFEPIme (MAXIPIME) 2 g in sodium chloride 0.9 % 100 mL IVPB        2 g 200 mL/hr over 30 Minutes Intravenous  Once 12/26/20 1333 12/26/20 1445   12/26/20 1345  metroNIDAZOLE (FLAGYL) IVPB 500 mg        500 mg 100 mL/hr over 60 Minutes Intravenous  Once 12/26/20 1333 12/26/20 1553   12/26/20 1345  vancomycin (VANCOCIN) IVPB 1000 mg/200 mL premix        1,000 mg 200 mL/hr over 60 Minutes Intravenous  Once 12/26/20 1333 12/26/20 1626       Assessment: 75YOM PMH triple positive APS (+aCL IgG/IgM, +, lupus anticoagulant, +anti-beta2 glycoprotein IgG) CVA, CAD, aortic atherosclerosis, HTN, HLD, CKD Stage 3, on warfarin PTA. Patient presenting to ED with diarrhea x 6 weeks. Per notes from hematology (Dr. Dionne Milo at [facility]) patient's INR goal is 2-3.  INR from most recent outpatient visits (11/29/20-12/20/20) have been > 2 (INRs  ranging 2.2-3.2).  PTA schedule:  warfarin 2.5 mg Tues, Wed, Thurs, Sat, Sun warfarin 5 mg Mon, Fri  DDI: Abx and prednisone PTA aspirin 81 mg daily PTA quetiapine: enhanced anticoagulant effect   Goal of Therapy:  INR 2-3 Monitor platelets by anticoagulation protocol: Yes   Plan:  INR within goal. Ensure patient maintains therapeutic INR given high risk of thrombosis.  Give warfarin 2.5 mg today, which is equal to home dose Daily CBC, INR.   Vallery Sa, PharmD, BCPS 12/29/2020 7:33 AM

## 2020-12-29 NOTE — Progress Notes (Signed)
AVS given and reviewed with pt at bedside and with pt's daughter, Leverne Humbles, via telephone. Medications discussed. All questions answered to satisfaction. Dressing supplies provided to pt. Will continue to monitor.

## 2020-12-29 NOTE — Care Management Important Message (Signed)
Important Message  Patient Details  Name: Kenneth Summers MRN: [id number] Date of Birth: [birth year]   Medicare Important Message Given:  Yes     Dannette Barbara 12/29/2020, 10:46 AM

## 2020-12-29 NOTE — Evaluation (Signed)
Physical Therapy Evaluation Patient Details Name: Kenneth Summers MRN: [id number] DOB: Nov 22, 1945 Today's Date: 12/29/2020  History of Present Illness  Pt admitted to [facility] on 12/26/20 for c/o persistent watery loose diarrhea x6 weeks. Significant PMH includes: lupus, hx CVA (residual L sided deficits), CAD, depression, HLD, HTN, RA, acute renal failure superimposed on CKD (stage 3a). Imaging revealed cardiomegaly with diffuse interstitial pulmonary opacity and distal sigmoid colon with wall thickening and fat stranding with colitis. Diagnosed with clinical sepsis and acute colitis.   Clinical Impression  Pt is a 75 year old M admitted to hospital on 12/26/20 for acute colitis. At baseline, pt is mostly bedbound requiring hoyer lift for transfer OOB; currently active with HHPT working on BLE therex and proximal stability/balance. Pt requires assist for ADL's and IADL's from family and Aides. Pt presents with L hemiplegia (LLE>RUE), impaired L5 LLE, developing L PF contracture, gross functional weakness, and decreased activity tolerance resulting in impaired functional mobility. Due to deficits, pt required mod assist for rolling; attempted supine>sit but unable due to poor proximal stability despite total assist from PT, HOB elevated, and use of BUE for support. Deficits limit the pt's ability to safely and independently perform ADL's and transfer. Pt will benefit from acute skilled PT services to address deficits for continued progress towards goals. At this time, PT recommends return home with 24/7 care and resumption of HHPT services. Spoke with daughter via phone regarding home set up and PLOF; daughter stating that herself, family, and aides will be able to provide level of care pt currently requires and that they have no DME needs. Pt will benefit from [facility] boot to address LLE PF contracture, for improved safety/tolerance with upright mobility.        Recommendations for follow up therapy are  one component of a multi-disciplinary discharge planning process, led by the attending physician.  Recommendations may be updated based on patient status, additional functional criteria and insurance authorization.  Follow Up Recommendations Home health PT    Assistance Recommended at Discharge Frequent or constant Supervision/Assistance  Functional Status Assessment Patient has had a recent decline in their functional status and/or demonstrates limited ability to make significant improvements in function in a reasonable and predictable amount of time  Equipment Recommendations   (PRAFO Boot LLE)       Precautions / Restrictions Precautions Precautions: Fall Restrictions Weight Bearing Restrictions: No Other Position/Activity Restrictions: L hemiplegia (LLE>LUE)      Mobility  Bed Mobility Overal bed mobility: Needs Assistance Bed Mobility: Rolling;Supine to Sit Rolling: Mod assist   Supine to sit: Total assist;HOB elevated     General bed mobility comments: Mod assist for bil rolling, able to assist with BUE support on handrail. Attempted supine>sit but unable despite total assist and BUE support.    Transfers  Unable                        Balance Overall balance assessment: Needs assistance     Sitting balance - Comments: attempted but unable as he was unable to perform supine>sit. Likely zero                                     Pertinent Vitals/Pain Pain Assessment: No/denies pain    Home Living Family/patient expects to be discharged to:: Private residence Living Arrangements: Spouse/significant other (supportive family) Available Help at Discharge: Family;Available 24 hours/day (  spouse available 24/7; has 2 aides that comes daily for 6 hours total each; daughter available "most of the time") Type of Home: House Home Access: Ramped entrance       Home Layout: One level Home Equipment: Wheelchair - power;Hospital bed Additional  Comments: hoyer lift, RW, quad cane    Prior Function Prior Level of Function : Needs assist;Patient poor historian/Family not available             Mobility Comments: PLOF/home set up taken from daughter via phone. Needs assist with bed mobility and uses hoyer for transfers. Currently active with HHPT/OT at home, and they are working on standing and proximal stability/balance. Pt was previously active with OPPT at [facility], but stopped coming due to sacral wound. Daughter states that pt hasn't walked since working with Eustaquio Maize, [place] (almost a year ago). ADLs Comments: Pt requires assist with all ADL's and IADL's. Pt sponge bathes in bed, uses brief for toileting, and uses hoyer lift for OOB mobility. Pt has very supportive family + 2 aides.     Hand Dominance   Dominant Hand: Right    Extremity/Trunk Assessment   Upper Extremity Assessment Upper Extremity Assessment: LUE deficits/detail;Overall WFL for tasks assessed LUE Deficits / Details: L hemiplegia; Grossly 3+/5, sensation intact    Lower Extremity Assessment Lower Extremity Assessment: Overall WFL for tasks assessed;LLE deficits/detail LLE Deficits / Details: L hemiplegia; grossly 2-/5, sensation intact except for L5. Decreased DF AROM likely due to developing PF contracture       Communication   Communication: No difficulties  Cognition Arousal/Alertness: Awake/alert Behavior During Therapy: Flat affect;WFL for tasks assessed/performed Overall Cognitive Status: Within Functional Limits for tasks assessed                                 General Comments: A&O x3; unable to state situation        General Comments General comments (skin integrity, edema, etc.): blisters to bil heels with kerlix covering them; prevlon boots donned upon entry/exit. TTP L forearm.    Exercises Other Exercises Other Exercises: Participated in supine scooting towards HOB, bil rolling, and attempting supine>sit. Mod assist  for rolling but unable to perform supine>sit despite total assist from PT, HOB elevated, and use of BUE for support. Other Exercises: Pt educated re: PT role/POC, DC recommendations, safety with mobility, prevlon boots. He verbalized understanding.   Assessment/Plan    PT Assessment Patient needs continued PT services  PT Problem List Decreased strength;Decreased activity tolerance;Decreased balance;Decreased mobility;Impaired sensation;Decreased skin integrity       PT Treatment Interventions Functional mobility training;Therapeutic activities;Therapeutic exercise;Balance training;Neuromuscular re-education    PT Goals (Current goals can be found in the Care Plan section)  Acute Rehab PT Goals Patient Stated Goal: "go home" PT Goal Formulation: With patient Time For Goal Achievement: 01/12/21 Potential to Achieve Goals: Good    Frequency Min 2X/week    AM-PAC PT "6 Clicks" Mobility  Outcome Measure Help needed turning from your back to your side while in a flat bed without using bedrails?: A Lot Help needed moving from lying on your back to sitting on the side of a flat bed without using bedrails?: Total Help needed moving to and from a bed to a chair (including a wheelchair)?: Total Help needed standing up from a chair using your arms (e.g., wheelchair or bedside chair)?: Total Help needed to walk in hospital room?: Total Help  needed climbing 3-5 steps with a railing? : Total 6 Click Score: 7    End of Session   Activity Tolerance: Patient limited by fatigue (and weakness) Patient left: in bed;with bed alarm set (prevlon boots donned; HOB elevated >30deg; chux supported under L side for neutral posture) Nurse Communication: Mobility status PT Visit Diagnosis: Unsteadiness on feet (R26.81);Muscle weakness (generalized) (M62.81);Hemiplegia and hemiparesis Hemiplegia - Right/Left: Left    Time: 0623-7628 PT Time Calculation (min) (ACUTE ONLY): 18 min   Charges:   PT  Evaluation $PT Eval Low Complexity: 1 Low $PT Eval Moderate Complexity: 1 Mod PT Treatments $Therapeutic Activity: 8-22 mins       Herminio Commons, PT, DPT 1:08 PM,12/29/20

## 2020-12-29 NOTE — Discharge Summary (Addendum)
Physician Discharge Summary  Kenneth Summers XBD:[id number] DOB: 10/09/1945 DOA: 12/26/2020  PCP: Toni Arthurs, NP  Admit date: 12/26/2020 Discharge date: 12/30/20  Admitted From: home  Disposition:  home w/ home health   Recommendations for Outpatient Follow-up:  Follow up with PCP in 1-2 weeks  Home Health: yes  Equipment/Devices:  Discharge Condition: stable  CODE STATUS: DNR  Diet recommendation: Heart Healthy / Carb Modified   Brief/Interim Summary: HPI was taken from Dr. Blaine Hamper: Kenneth Summers is a 75 y.o. male with medical history significant of stroke with left-sided weakness (bedbound), hypertension, hyperlipidemia, GERD, depression, RA and SLE on chronic prednisone, PAD, IgA deficiency, IBS, C. difficile colitis, CKD-3A, BPH, chronic back pain, PAD, antiphospholipid syndrome on Coumadin, who presents with diarrhea.   Per his daughter at bedside, patient has chronic intermittent diarrhea, which has worsened in the past 3 days.  Patient has at least 5 times of watery diarrhea each day, mixed with mucus and maroon-colored stool. Patient does not have nausea or vomiting.  Patient has intermittent mild central and left sided abdominal pain.  No fever or chills.  Patient has dry cough, denies shortness of breath.  Patient states that he had some chest discomfort yesterday, which is resolved.  Currently no active chest pain.  Denies symptoms of UTI.  He has blisters I both heels.   ED Course: pt was found to have WBC 13.5, lactic acid 2.4, INR 2.1, PTT 61, BNP 127, negative COVID PCR, negative flu, worsening renal function, lipase 31,  negative C diff, temperature 100.7, blood pressure 118/96, heart rate 59, RR 22, oxygen saturation 96% on room air.  Chest x-ray showed cardiomegaly and bilateral interstitial opacity.  CT of abdomen/pelvis that showed colitis.  Patient is admitted to [place] bed as inpatient.   CT-abd/pelvis 1. The distal sigmoid colon demonstrates wall thickening  with adjacent fat stranding. These findings may extend into the proximal rectum. The findings are consistent with nonspecific colitis which could be infectious, inflammatory, or ischemic. Recommend clinical correlation. 2. The dependent opacities in the lung bases favored represent atelectasis rather than infiltrate. 3. Calcified atherosclerosis in the aorta and iliac vessels. 4. Possible sludge or stones in the neck of the gallbladder without wall thickening or Peri cholecystic fluid. 5. No other abnormalities.  As per Dr. Leslye Peer: 29 year old man with history of stroke and left leg paralysis.  Basically bedbound.  History of antiphospholipid syndrome, BPH, CAD, CKD, IgA deficiency, PAD, rheumatoid arthritis.  He presents with uncontrollable diarrhea.  Stool studies negative.  CT scan concerning for acute colitis.  Started on antibiotics.  No diarrhea today so far but had frequent diarrhea yesterday.  Started on Bentyl.  Patient a little more lethargic today and this afternoon.  We will get a CT scan of the head.  Hospital course from Dr. Jimmye Norman 12/29/20: Pt did not have any diarrhea on the day of discharge. Pt was d/c home w/ po flagyl, cipro to complete the course at home. Of note, a foley was placed on 12/26/20 for urinary retention and foley was d/c & voiding trial was done on the day of d/c.OT recs SNF and PT recs HH. Pt was d/c home w/ HH. For more information, please see previous progress notes.  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  See Dr. Marshia Ly note on how the pt met sepsis criteria  Acute colitis: GI PCR panel was neg. Bentyl prn   Lethargy: continue on decreased dose of seroquel, gabapentin. CT head shows no acute intracranial findings.    Urinary retention: foley placed on 11/20 evidently. D/c foley and do a voiding trial today   AKI on CKDIIIa: Cr is trending down from day prior  Bradycardia: continue to hold atenolol   HTN: continue to hold losartan secondary to AKI   Hx of CVA: continue on aspirin  Antiphospholipid syndrome: continue on home dose of warfarin. Pharmacy to dose and monitor   Depression: severity unknown. Continue on home dose of fluoxetine  RLS: continue on requip  Seizure disorder: continue on home dose of lamictal   Hx of RA: continue on home chronic dose of prednisone   Discharge Instructions  Discharge Instructions     Diet - low sodium heart healthy   Complete by: As directed    Discharge instructions   Complete by: As directed    F/u w/ PCP in 1-2 weeks.   Discharge wound care:   Complete by: As directed    Wound care to bilateral medial heel serum-filled blisters, Stage 2 pressure injuries (POA): Cleanse with NS, pat dry. Cover affected areas with folded pieces of xeroform gauze, top with dry gauze 4x4s and secure with Kerlix roll gauze/paper tape. Place feet into [employer].   Increase activity slowly   Complete by: As directed       Allergies as of 12/29/2020       Reactions   Hydroxychloroquine Other (See Comments)   Other reaction(s): Other (See Comments)  Causing blindness Visual problems        Medication List     TAKE these medications    aspirin EC 81 MG tablet Take by mouth daily.   atenolol 25 MG tablet Commonly known as: TENORMIN Take 0.5 tablets (12.5 mg total) by mouth daily. Hold this medication until you see your PCP What changed: additional instructions   atorvastatin 80  MG tablet Commonly known as: LIPITOR Take 80 mg by mouth Nightly.   cholecalciferol 25 MCG (1000 UNIT) tablet Commonly known as: VITAMIN D3 Take 2,000 Units by mouth daily.   ciprofloxacin 500 MG tablet Commonly known as: Cipro Take 1 tablet (500 mg total) by mouth 2 (two) times daily for 3 days.   FLUoxetine 40 MG capsule Commonly known as: PROZAC Take 40 mg by mouth daily.   fluticasone 50 MCG/ACT nasal spray Commonly known as: FLONASE Place into both nostrils daily.   gabapentin 400 MG capsule Commonly known as: NEURONTIN Take 1,300 mg by mouth at bedtime.   hydrocortisone 2.5 % cream Apply to affected areas face 1-2 times daily as directed.   ketoconazole 2 % cream Commonly known as: NIZORAL Apply to affected areas face 1-2 times daily as directed.   lamoTRIgine 25 MG tablet Commonly known as: LAMICTAL Take 75 mg by mouth 2 (two) times daily.   losartan 25 MG tablet Commonly known as: COZAAR Take 25 mg by mouth daily.   metroNIDAZOLE 500 MG tablet Commonly known as: Flagyl Take 1 tablet (500  mg total) by mouth 3 (three) times daily for 3 days.   montelukast 10 MG tablet Commonly known as: SINGULAIR Take 10 mg by mouth daily.   olopatadine 0.1 % ophthalmic solution Commonly known as: PATANOL Place 1 drop into both eyes 2 (two) times daily.   predniSONE 5 MG tablet Commonly known as: DELTASONE Take 5 mg by mouth daily with breakfast.   QUEtiapine 25 MG tablet Commonly known as: SEROQUEL 25 mg at bedtime.   rOPINIRole 2 MG tablet Commonly known as: REQUIP Take 2 mg by mouth at bedtime.   VITAMIN B 12 PO Take by mouth.   warfarin 5 MG tablet Commonly known as: COUMADIN Take 5 mg by mouth 2 (two) times a week. Monday and Friday.   warfarin 2.5 MG tablet Commonly known as: COUMADIN Take 2.5 mg by mouth daily. Tues, Wednesday Thursday, Saturday, Sunday               Discharge Care Instructions  (From admission, onward)            Start     Ordered   12/29/20 0000  Discharge wound care:       Comments: Wound care to bilateral medial heel serum-filled blisters, Stage 2 pressure injuries (POA): Cleanse with NS, pat dry. Cover affected areas with folded pieces of xeroform gauze, top with dry gauze 4x4s and secure with Kerlix roll gauze/paper tape. Place feet into [employer].   12/29/20 1233            Allergies  Allergen Reactions   Hydroxychloroquine Other (See Comments)    Other reaction(s): Other (See Comments)  Causing blindness Visual problems     Consultations:    Procedures/Studies: CT ABDOMEN PELVIS WO CONTRAST  Result Date: 12/26/2020 CLINICAL DATA:  Abdominal pain.  Diarrhea. EXAM: CT ABDOMEN AND PELVIS WITHOUT CONTRAST TECHNIQUE: Multidetector CT imaging of the abdomen and pelvis was performed following the standard protocol without IV contrast. COMPARISON:  April 11, 2013 FINDINGS: Lower chest: Dependent opacities in the lung bases favored represent atelectasis rather than infiltrate. Three-vessel coronary artery disease. No other abnormalities in the lower chest. Hepatobiliary: The liver is unremarkable. High attenuation near the neck of the gallbladder could represent sludge or stones. No gallbladder wall thickening or adjacent fluid. Pancreas: Unremarkable. No pancreatic ductal dilatation or surrounding inflammatory changes. Spleen: Normal in size without focal abnormality. Adrenals/Urinary Tract: Adrenal glands are normal. The kidneys are relatively atrophic with a right renal cyst. A few nonobstructive stones are identified in the kidneys. A calcification in the right renal hilum is vascular in nature. Bilateral perinephric stranding is stable. No ureterectasis or ureteral stones. The bladder is normal. Stomach/Bowel: The stomach and small bowel are normal. There is a short segment of distal descending colon, near the junction with the rectum that is thick walled with adjacent fat stranding.  Extension into the proximal rectum is possible. The remainder of the colon is normal. The appendix is normal. Vascular/Lymphatic: Calcified atherosclerosis is seen in the nonaneurysmal aorta extending into the iliac vessels and femoral vessels. No adenopathy. Reproductive: Prostate is unremarkable. Other: No abdominal wall hernia or abnormality. No abdominopelvic ascites. Musculoskeletal: No acute or significant osseous findings. IMPRESSION: 1. The distal sigmoid colon demonstrates wall thickening with adjacent fat stranding. These findings may extend into the proximal rectum. The findings are consistent with nonspecific colitis which could be infectious, inflammatory, or ischemic. Recommend clinical correlation. 2. The dependent opacities in the lung bases favored represent atelectasis rather than infiltrate.  3. Calcified atherosclerosis in the aorta and iliac vessels. 4. Possible sludge or stones in the neck of the gallbladder without wall thickening or Peri cholecystic fluid. 5. No other abnormalities. Electronically Signed   By: Dorise Bullion III M.D.   On: 12/26/2020 14:15   CT HEAD WO CONTRAST (5MM)  Result Date: 12/28/2020 CLINICAL DATA:  Mental status change, unknown cause EXAM: CT HEAD WITHOUT CONTRAST TECHNIQUE: Contiguous axial images were obtained from the base of the skull through the vertex without intravenous contrast. COMPARISON:  January 2022 FINDINGS: Brain: There is no acute intracranial hemorrhage, mass effect, or edema. No new loss of gray-white differentiation. There is no extra-axial fluid collection. Prominence of the ventricles and sulci reflects stable parenchymal volume loss. Patchy hypoattenuation in the supratentorial white matter is nonspecific but probably reflects stable chronic microvascular ischemic changes. Small chronic right occipital infarct. Vascular: There is atherosclerotic calcification at the skull base. Skull: Calvarium is unremarkable. Sinuses/Orbits: No acute  finding. Other: None. IMPRESSION: No acute intracranial abnormality. Stable chronic/nonemergent findings detailed above. Electronically Signed   By: Macy Mis M.D.   On: 12/28/2020 13:52   DG Chest Portable 1 View  Result Date: 12/26/2020 CLINICAL DATA:  Shortness of breath EXAM: PORTABLE CHEST 1 VIEW COMPARISON:  02/19/2020 FINDINGS: Cardiomegaly mild, diffuse bilateral interstitial pulmonary opacity. Small bilateral pleural effusions. The visualized skeletal structures are unremarkable. IMPRESSION: 1. Cardiomegaly with mild, diffuse bilateral interstitial pulmonary opacity, likely edema. 2.  Small bilateral pleural effusions. Electronically Signed   By: Delanna Ahmadi M.D.   On: 12/26/2020 12:25   DG Abd 2 Views  Result Date: 12/28/2020 CLINICAL DATA:  Diarrhea/acute colitis. EXAM: ABDOMEN - 2 VIEW COMPARISON:  CT from December 26, 2020. FINDINGS: No free air beneath either the RIGHT or LEFT hemidiaphragm. Study limited by patient body habitus. Relative paucity of gas in the abdomen. Scattered gas-filled loops of colon are present without distension. No abnormal calcification. Signs of lumbosacral spinal fusion as before. No acute bony findings on limited assessment. Incidentally imaged lung bases with mild atelectatic changes at the LEFT lung base in particular. IMPRESSION: Nonobstructive bowel gas pattern. Relative paucity of gas in the abdomen. No acute findings. Opacities at the LEFT lung base, likely atelectasis. Electronically Signed   By: Zetta Bills M.D.   On: 12/28/2020 08:37   (Echo, Carotid, EGD, Colonoscopy, ERCP)    Subjective: Pt c/o fatigue    Discharge Exam: Vitals:   12/29/20 0407 12/29/20 0725  BP: (!) 139/59 (!) 175/76  Pulse: (!) 59 (!) 55  Resp: 16 18  Temp: 99 F (37.2 C) 98.2 F (36.8 C)  SpO2: 93% 98%   Vitals:   12/28/20 1529 12/28/20 1948 12/29/20 0407 12/29/20 0725  BP: (!) 175/96 (!) 122/94 (!) 139/59 (!) 175/76  Pulse: (!) 59 60 (!) 59 (!) 55   Resp: $Remo'18 16 16 18  'FPBHw$ Temp: 99 F (37.2 C) 97.7 F (36.5 C) 99 F (37.2 C) 98.2 F (36.8 C)  TempSrc:  Oral Oral Oral  SpO2: 98% 98% 93% 98%  Weight:      Height:        General: Pt is alert, awake, not in acute distress Cardiovascular: S1/S2 +, no rubs, no gallops Respiratory: CTA bilaterally, no wheezing, no rhonchi Abdominal: Soft, NT, ND, bowel sounds + Extremities: no cyanosis    The results of significant diagnostics from this hospitalization (including imaging, microbiology, ancillary and laboratory) are listed below for reference.     Microbiology: Recent Results (from  the past 240 hour(s))  Blood culture (routine x 2)     Status: None (Preliminary result)   Collection Time: 12/26/20 12:43 PM   Specimen: BLOOD LEFT HAND  Result Value Ref Range Status   Specimen Description BLOOD LEFT HAND  Final   Special Requests   Final    BOTTLES DRAWN AEROBIC AND ANAEROBIC Blood Culture adequate volume   Culture   Final    NO GROWTH 3 DAYS Performed at [facility], [address] [place], [place] [address]    Report Status PENDING  Incomplete  Blood culture (routine x 2)     Status: None (Preliminary result)   Collection Time: 12/26/20 12:43 PM   Specimen: BLOOD RIGHT HAND  Result Value Ref Range Status   Specimen Description BLOOD RIGHT HAND  Final   Special Requests   Final    BOTTLES DRAWN AEROBIC AND ANAEROBIC Blood Culture adequate volume   Culture   Final    NO GROWTH 3 DAYS Performed at [facility], [address], [place], [place] [address]    Report Status PENDING  Incomplete  Resp Panel by RT-PCR (Flu A&B, Covid) Nasopharyngeal Swab     Status: None   Collection Time: 12/26/20 12:43 PM   Specimen: Nasopharyngeal Swab; Nasopharyngeal(NP) swabs in vial transport medium  Result Value Ref Range Status   SARS Coronavirus 2 by RT PCR NEGATIVE NEGATIVE Final    Comment: (NOTE) SARS-CoV-2 target nucleic acids are NOT DETECTED.  The  SARS-CoV-2 RNA is generally detectable in upper respiratory specimens during the acute phase of infection. The lowest concentration of SARS-CoV-2 viral copies this assay can detect is 138 copies/mL. A negative result does not preclude SARS-Cov-2 infection and should not be used as the sole basis for treatment or other patient management decisions. A negative result may occur with  improper specimen collection/handling, submission of specimen other than nasopharyngeal swab, presence of viral mutation(s) within the areas targeted by this assay, and inadequate number of viral copies(<138 copies/mL). A negative result must be combined with clinical observations, patient history, and epidemiological information. The expected result is Negative.  Fact Sheet for Patients:  EntrepreneurPulse.com.au  Fact Sheet for Healthcare Providers:  IncredibleEmployment.be  This test is no t yet approved or cleared by the [place] FDA and  has been authorized for detection and/or diagnosis of SARS-CoV-2 by FDA under an Emergency Use Authorization (EUA). This EUA will remain  in effect (meaning this test can be used) for the duration of the COVID-19 declaration under Section 564(b)(1) of the Act, 21 U.S.C.section 360bbb-3(b)(1), unless the authorization is terminated  or revoked sooner.       Influenza A by PCR NEGATIVE NEGATIVE Final   Influenza B by PCR NEGATIVE NEGATIVE Final    Comment: (NOTE) The Xpert Xpress SARS-CoV-2/FLU/RSV plus assay is intended as an aid in the diagnosis of influenza from Nasopharyngeal swab specimens and should not be used as a sole basis for treatment. Nasal washings and aspirates are unacceptable for Xpert Xpress SARS-CoV-2/FLU/RSV testing.  Fact Sheet for Patients: EntrepreneurPulse.com.au  Fact Sheet for Healthcare Providers: IncredibleEmployment.be  This test is not yet approved or  cleared by the [place] FDA and has been authorized for detection and/or diagnosis of SARS-CoV-2 by FDA under an Emergency Use Authorization (EUA). This EUA will remain in effect (meaning this test can be used) for the duration of the COVID-19 declaration under Section 564(b)(1) of the Act, 21 U.S.C. section 360bbb-3(b)(1), unless the authorization is  terminated or revoked.  Performed at [facility], [address] [place], [place] [address]   C Difficile Quick Screen w PCR reflex     Status: None   Collection Time: 12/26/20 12:49 PM   Specimen: Stool  Result Value Ref Range Status   C Diff antigen NEGATIVE NEGATIVE Final   C Diff toxin NEGATIVE NEGATIVE Final   C Diff interpretation No C. difficile detected.  Final    Comment: Performed at [facility], [address] [place], [place] [address]  Gastrointestinal Panel by PCR , Stool     Status: None   Collection Time: 12/27/20  8:20 AM  Result Value Ref Range Status   Campylobacter species NOT DETECTED NOT DETECTED Final   Plesimonas shigelloides NOT DETECTED NOT DETECTED Final   Salmonella species NOT DETECTED NOT DETECTED Final   Yersinia enterocolitica NOT DETECTED NOT DETECTED Final   Vibrio species NOT DETECTED NOT DETECTED Final   Vibrio cholerae NOT DETECTED NOT DETECTED Final   Enteroaggregative E coli (EAEC) NOT DETECTED NOT DETECTED Final   Enteropathogenic E coli (EPEC) NOT DETECTED NOT DETECTED Final   Enterotoxigenic E coli (ETEC) NOT DETECTED NOT DETECTED Final   Shiga like toxin producing E coli (STEC) NOT DETECTED NOT DETECTED Final   Shigella/Enteroinvasive E coli (EIEC) NOT DETECTED NOT DETECTED Final   Cryptosporidium NOT DETECTED NOT DETECTED Final   Cyclospora cayetanensis NOT DETECTED NOT DETECTED Final   Entamoeba histolytica NOT DETECTED NOT DETECTED Final   Giardia lamblia NOT DETECTED NOT DETECTED Final   Adenovirus F40/41 NOT DETECTED NOT DETECTED Final   Astrovirus NOT  DETECTED NOT DETECTED Final   Norovirus GI/GII NOT DETECTED NOT DETECTED Final   Rotavirus A NOT DETECTED NOT DETECTED Final   Sapovirus (I, II, IV, and V) NOT DETECTED NOT DETECTED Final    Comment: Performed at [facility], [address] [place], [place] [address]     Labs: BNP (last 3 results) Recent Labs    12/26/20 1100  BNP 004.5*   Basic Metabolic Panel: Recent Labs  Lab 12/26/20 1021 12/27/20 0441 12/28/20 0257  NA 133* 136 137  K 4.6 4.4 3.5  CL 103 103 109  CO2 17* 26 23  GLUCOSE 112* 124* 102*  BUN 42* 38* 29*  CREATININE 1.59* 1.34* 1.17  CALCIUM 8.4* 8.0* 8.1*   Liver Function Tests: Recent Labs  Lab 12/26/20 1021  AST 31  ALT 19  ALKPHOS 69  BILITOT 1.5*  PROT 7.6  ALBUMIN 3.3*   Recent Labs  Lab 12/26/20 1021  LIPASE 31   No results for input(s): AMMONIA in the last 168 hours. CBC: Recent Labs  Lab 12/26/20 1021 12/27/20 0441 12/28/20 0257  WBC 13.5* 9.7 8.6  HGB 13.7 11.3* 11.3*  HCT 43.1 35.9* 34.3*  MCV 93.7 93.5 90.7  PLT 150 153 165   Cardiac Enzymes: No results for input(s): CKTOTAL, CKMB, CKMBINDEX, TROPONINI in the last 168 hours. BNP: Invalid input(s): POCBNP CBG: No results for input(s): GLUCAP in the last 168 hours. D-Dimer No results for input(s): DDIMER in the last 72 hours. Hgb A1c No results for input(s): HGBA1C in the last 72 hours. Lipid Profile No results for input(s): CHOL, HDL, LDLCALC, TRIG, CHOLHDL, LDLDIRECT in the last 72 hours. Thyroid function studies No results for input(s): TSH, T4TOTAL, T3FREE, THYROIDAB in the last 72 hours.  Invalid input(s): FREET3 Anemia work up No results for input(s): VITAMINB12, FOLATE, FERRITIN, TIBC, IRON, RETICCTPCT in the last  72 hours. Urinalysis    Component Value Date/Time   COLORURINE YELLOW (A) 12/26/2020 2000   APPEARANCEUR CLEAR (A) 12/26/2020 2000   APPEARANCEUR Clear 04/23/2013 0903   LABSPEC 1.016 12/26/2020 2000   LABSPEC 1.004 04/23/2013  0903   PHURINE 5.0 12/26/2020 2000   GLUCOSEU NEGATIVE 12/26/2020 2000   GLUCOSEU Negative 04/23/2013 0903   HGBUR NEGATIVE 12/26/2020 2000   BILIRUBINUR NEGATIVE 12/26/2020 2000   BILIRUBINUR Negative 04/23/2013 0903   KETONESUR NEGATIVE 12/26/2020 2000   PROTEINUR NEGATIVE 12/26/2020 2000   NITRITE NEGATIVE 12/26/2020 2000   LEUKOCYTESUR NEGATIVE 12/26/2020 2000   LEUKOCYTESUR 1+ 04/23/2013 0903   Sepsis Labs Invalid input(s): PROCALCITONIN,  WBC,  LACTICIDVEN Microbiology Recent Results (from the past 240 hour(s))  Blood culture (routine x 2)     Status: None (Preliminary result)   Collection Time: 12/26/20 12:43 PM   Specimen: BLOOD LEFT HAND  Result Value Ref Range Status   Specimen Description BLOOD LEFT HAND  Final   Special Requests   Final    BOTTLES DRAWN AEROBIC AND ANAEROBIC Blood Culture adequate volume   Culture   Final    NO GROWTH 3 DAYS Performed at [facility], [address], [place], [place] [address]    Report Status PENDING  Incomplete  Blood culture (routine x 2)     Status: None (Preliminary result)   Collection Time: 12/26/20 12:43 PM   Specimen: BLOOD RIGHT HAND  Result Value Ref Range Status   Specimen Description BLOOD RIGHT HAND  Final   Special Requests   Final    BOTTLES DRAWN AEROBIC AND ANAEROBIC Blood Culture adequate volume   Culture   Final    NO GROWTH 3 DAYS Performed at [facility], [address], [place], [place] [address]    Report Status PENDING  Incomplete  Resp Panel by RT-PCR (Flu A&B, Covid) Nasopharyngeal Swab     Status: None   Collection Time: 12/26/20 12:43 PM   Specimen: Nasopharyngeal Swab; Nasopharyngeal(NP) swabs in vial transport medium  Result Value Ref Range Status   SARS Coronavirus 2 by RT PCR NEGATIVE NEGATIVE Final    Comment: (NOTE) SARS-CoV-2 target nucleic acids are NOT DETECTED.  The SARS-CoV-2 RNA is generally detectable in upper respiratory specimens during the acute phase of  infection. The lowest concentration of SARS-CoV-2 viral copies this assay can detect is 138 copies/mL. A negative result does not preclude SARS-Cov-2 infection and should not be used as the sole basis for treatment or other patient management decisions. A negative result may occur with  improper specimen collection/handling, submission of specimen other than nasopharyngeal swab, presence of viral mutation(s) within the areas targeted by this assay, and inadequate number of viral copies(<138 copies/mL). A negative result must be combined with clinical observations, patient history, and epidemiological information. The expected result is Negative.  Fact Sheet for Patients:  EntrepreneurPulse.com.au  Fact Sheet for Healthcare Providers:  IncredibleEmployment.be  This test is no t yet approved or cleared by the [place] FDA and  has been authorized for detection and/or diagnosis of SARS-CoV-2 by FDA under an Emergency Use Authorization (EUA). This EUA will remain  in effect (meaning this test can be used) for the duration of the COVID-19 declaration under Section 564(b)(1) of the Act, 21 U.S.C.section 360bbb-3(b)(1), unless the authorization is terminated  or revoked sooner.       Influenza A by PCR NEGATIVE NEGATIVE Final   Influenza B by PCR NEGATIVE NEGATIVE Final  Comment: (NOTE) The Xpert Xpress SARS-CoV-2/FLU/RSV plus assay is intended as an aid in the diagnosis of influenza from Nasopharyngeal swab specimens and should not be used as a sole basis for treatment. Nasal washings and aspirates are unacceptable for Xpert Xpress SARS-CoV-2/FLU/RSV testing.  Fact Sheet for Patients: EntrepreneurPulse.com.au  Fact Sheet for Healthcare Providers: IncredibleEmployment.be  This test is not yet approved or cleared by the [place] FDA and has been authorized for detection and/or diagnosis of SARS-CoV-2  by FDA under an Emergency Use Authorization (EUA). This EUA will remain in effect (meaning this test can be used) for the duration of the COVID-19 declaration under Section 564(b)(1) of the Act, 21 U.S.C. section 360bbb-3(b)(1), unless the authorization is terminated or revoked.  Performed at [facility], [address] [place], [place] [address]   C Difficile Quick Screen w PCR reflex     Status: None   Collection Time: 12/26/20 12:49 PM   Specimen: Stool  Result Value Ref Range Status   C Diff antigen NEGATIVE NEGATIVE Final   C Diff toxin NEGATIVE NEGATIVE Final   C Diff interpretation No C. difficile detected.  Final    Comment: Performed at [facility], [address] [place], [place] [address]  Gastrointestinal Panel by PCR , Stool     Status: None   Collection Time: 12/27/20  8:20 AM  Result Value Ref Range Status   Campylobacter species NOT DETECTED NOT DETECTED Final   Plesimonas shigelloides NOT DETECTED NOT DETECTED Final   Salmonella species NOT DETECTED NOT DETECTED Final   Yersinia enterocolitica NOT DETECTED NOT DETECTED Final   Vibrio species NOT DETECTED NOT DETECTED Final   Vibrio cholerae NOT DETECTED NOT DETECTED Final   Enteroaggregative E coli (EAEC) NOT DETECTED NOT DETECTED Final   Enteropathogenic E coli (EPEC) NOT DETECTED NOT DETECTED Final   Enterotoxigenic E coli (ETEC) NOT DETECTED NOT DETECTED Final   Shiga like toxin producing E coli (STEC) NOT DETECTED NOT DETECTED Final   Shigella/Enteroinvasive E coli (EIEC) NOT DETECTED NOT DETECTED Final   Cryptosporidium NOT DETECTED NOT DETECTED Final   Cyclospora cayetanensis NOT DETECTED NOT DETECTED Final   Entamoeba histolytica NOT DETECTED NOT DETECTED Final   Giardia lamblia NOT DETECTED NOT DETECTED Final   Adenovirus F40/41 NOT DETECTED NOT DETECTED Final   Astrovirus NOT DETECTED NOT DETECTED Final   Norovirus GI/GII NOT DETECTED NOT DETECTED Final   Rotavirus A NOT  DETECTED NOT DETECTED Final   Sapovirus (I, II, IV, and V) NOT DETECTED NOT DETECTED Final    Comment: Performed at [facility], [address], [place], [place] [address]     Time coordinating discharge: Over 30 minutes  SIGNED:   Wyvonnia Dusky, MD  [facility] 12/29/2020, 2:02 PM Pager   If 7PM-7AM, please contact night-coverage

## 2020-12-29 NOTE — TOC Initial Note (Addendum)
Transition of Care [facility]) - Initial/Assessment Note    Patient Details  Name: Kenneth Summers MRN: [id number] Date of Birth: [birth year]  Transition of [facility]) CM/SW Contact:    Beverly Sessions, RN Phone Number: 12/29/2020, 1:19 PM  Clinical Narrative:                   Patient admitted from home with acute colitis  Assessment completed with daughter Patient lives at home with daughter and wife  PCP Payton Mccallum - uses [facility] transport for appointments Pharmacy [facility].  Denies issues obtaining medications  Patient has power wheel chair, lift, and hospital bed  Patient open with [facility] home health  Daughter wishes for patient to return home with home health Adding RN for wound care  Malachy Mood with [facility] notified of discharge   EMS packet and DNR on chart EMS called    Expected Discharge Plan: [place] Barriers to Discharge: Barriers Resolved   Patient Goals and CMS Choice        Expected Discharge Plan and Services Expected Discharge Plan: [place]   Discharge Planning Services: CM Consult     Expected Discharge Date: 12/29/20                         HH Arranged: RN, PT, OT [facility] Agency: [place] Date [facility] Agency Contacted: 12/29/20   Representative spoke with at [place]: Malachy Mood  Prior Living Arrangements/Services   Lives with:: Adult Children, Spouse Patient language and need for interpreter reviewed:: Yes Do you feel safe going back to the place where you live?: Yes      Need for Family Participation in Patient Care: Yes (Comment) Care giver support system in place?: Yes (comment) Current home services: DME, Home OT, Home PT Criminal Activity/Legal Involvement Pertinent to Current Situation/Hospitalization: No - Comment as needed  Activities of Daily Living   ADL Screening (condition at time of admission) [name] cognitive ability adequate to safely complete daily activities?:  No  Permission Sought/Granted                  Emotional Assessment       Orientation: : Oriented to Self, Oriented to Place Alcohol / Substance Use: Not Applicable Psych Involvement: No (comment)  Admission diagnosis:  Diarrhea [R19.7] Lactic acidosis [E87.20] Colitis [K52.9] Acute colitis [K52.9] Sepsis, due to unspecified organism, unspecified whether acute organ dysfunction present [facility]) [A41.9] Patient Active Problem List   Diagnosis Date Noted   Pressure injury of skin 12/28/2020   Lethargy    Diarrhea    Seizure disorder ([place])    Acute colitis 12/26/2020   Severe sepsis ([place]) 12/26/2020   APS (antiphospholipid syndrome) ([place])    Cough    Acute renal failure superimposed on stage 3a chronic kidney disease ([place])    Stroke ([place])    Irritant contact dermatitis due to fecal, urinary or dual incontinence 10/21/2020   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/01/2020   Radiculopathy, lumbosacral region 09/07/2020   Unspecified urinary incontinence 09/07/2020   Pressure injury of right buttock, stage 2 ([place]) 06/16/2020   Sepsis ([place]) 10/18/2018   Long term (current) use of anticoagulants 05/01/2018   History of stroke 04/11/2018   Cerebrovascular accident (CVA) due to occlusion of right posterior cerebral artery ([place]) 10/24/2017   Hyperlipidemia 07/04/2017   Chronic diarrhea 06/12/2017   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 ([place]) 04/27/2016  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   Visual changes 06/19/2013   Anterolisthesis 01/16/2013   Cervical spinal stenosis 01/16/2013   Weakness 01/15/2013   Benign prostatic hyperplasia 01/07/2013   Depression 01/07/2013   Restless legs syndrome 01/07/2013   Chronic kidney disease, stage 3 unspecified ([place]) 12/20/2011   CAD (coronary artery disease) 11/03/2010   Essential hypertension 11/03/2010   PAD (peripheral artery disease) ([place]) 11/03/2010   SLE (systemic lupus erythematosus)  ([place]) 11/03/2010   PCP:  Toni Arthurs, NP Pharmacy:   [facility], [place] [address] Phone: [phone] Fax: [phone]     Social Determinants of Health (SDOH) Interventions    Readmission Risk Interventions No flowsheet data found.

## 2020-12-29 NOTE — Plan of Care (Signed)

## 2020-12-30 LAB — PROTIME-INR
INR: 2.3 — ABNORMAL HIGH (ref 0.8–1.2)
Prothrombin Time: 25.2 seconds — ABNORMAL HIGH (ref 11.4–15.2)

## 2020-12-30 MED ORDER — WARFARIN SODIUM 2.5 MG PO TABS
2.5000 mg | ORAL_TABLET | Freq: Once | ORAL | Status: DC
  Filled 2020-12-30: qty 1

## 2020-12-30 NOTE — Progress Notes (Signed)
[place] for warfarin Indication:  antiphospholipid syndrome  Allergies  Allergen Reactions   Hydroxychloroquine Other (See Comments)    Other reaction(s): Other (See Comments)  Causing blindness Visual problems     Patient Measurements: Height: 6\' 5"  (195.6 cm) Weight: 90.7 kg (200 lb) IBW/kg (Calculated) : 89.1   Vital Signs: Temp: 98.6 F (37 C) (11/24 0712) Temp Source: Oral (11/24 0440) BP: 168/68 (11/24 0712) Pulse Rate: 52 (11/24 0712)  Labs: Recent Labs    12/28/20 0257 12/29/20 0327 12/30/20 0349  HGB 11.3*  --   --   HCT 34.3*  --   --   PLT 165  --   --   LABPROT 27.5* 25.7* 25.2*  INR 2.6* 2.3* 2.3*  CREATININE 1.17  --   --      Estimated Creatinine Clearance: 67.7 mL/min (by C-G formula based on SCr of 1.17 mg/dL).   Medical History: Past Medical History:  Diagnosis Date   Actinic keratosis    Alcohol abuse    APS (antiphospholipid syndrome) (HCC)    Basal cell carcinoma    BPH (benign prostatic hyperplasia)    CAD (coronary artery disease)    Cellulitis    Cervical spinal stenosis    Chronic osteomyelitis (HCC)    CKD (chronic kidney disease)    CKD (chronic kidney disease)    Clostridium difficile diarrhea    Depression    Diplopia    Fatigue    GERD (gastroesophageal reflux disease)    Hyperlipemia    Hypertension    Hypovitaminosis D    IBS (irritable bowel syndrome)    IgA deficiency (HCC)    Insomnia    Left lumbosacral radiculopathy    Moderate obstructive sleep apnea    Osteomyelitis of foot (HCC)    PAD (peripheral artery disease) (HCC)    Pruritus    RA (rheumatoid arthritis) (HCC)    Radiculopathy    Restless legs syndrome    RLS (restless legs syndrome)    SLE (systemic lupus erythematosus related syndrome) (HCC)    Squamous cell carcinoma of skin 01/27/2019   right mid lower ear helix   Stroke (HCC)    Toxic maculopathy of both eyes     Medications:  Scheduled:    aspirin EC  81 mg Oral Daily   Chlorhexidine Gluconate Cloth  6 each Topical Daily   dicyclomine  10 mg Oral TID AC & HS   FLUoxetine  40 mg Oral Daily   gabapentin  1,200 mg Oral QHS   lamoTRIgine  75 mg Oral BID   predniSONE  10 mg Oral Q breakfast   QUEtiapine  12.5 mg Oral QHS   rOPINIRole  2 mg Oral QHS   Warfarin - Pharmacist Dosing Inpatient   Does not apply q1600   Infusions:   sodium chloride 30 mL/hr at 12/30/20 0538   piperacillin-tazobactam (ZOSYN)  IV 3.375 g (12/30/20 0540)   sodium chloride     PRN: acetaminophen, albuterol, dextromethorphan-guaiFENesin, loperamide, olopatadine, ondansetron (ZOFRAN) IV, zolpidem Anti-infectives (From admission, onward)    Start     Dose/Rate Route Frequency Ordered Stop   12/29/20 0000  metroNIDAZOLE (FLAGYL) 500 MG tablet        500 mg Oral 3 times daily 12/29/20 1233 01/01/21 2359   12/29/20 0000  ciprofloxacin (CIPRO) 500 MG tablet        500 mg Oral 2 times daily 12/29/20 1233 01/01/21 2359   12/26/20 2200  piperacillin-tazobactam (ZOSYN) IVPB 3.375 g        3.375 g 12.5 mL/hr over 240 Minutes Intravenous Every 8 hours 12/26/20 1816     12/26/20 1345  ceFEPIme (MAXIPIME) 2 g in sodium chloride 0.9 % 100 mL IVPB        2 g 200 mL/hr over 30 Minutes Intravenous  Once 12/26/20 1333 12/26/20 1445   12/26/20 1345  metroNIDAZOLE (FLAGYL) IVPB 500 mg        500 mg 100 mL/hr over 60 Minutes Intravenous  Once 12/26/20 1333 12/26/20 1553   12/26/20 1345  vancomycin (VANCOCIN) IVPB 1000 mg/200 mL premix        1,000 mg 200 mL/hr over 60 Minutes Intravenous  Once 12/26/20 1333 12/26/20 1626       Assessment: 75YOM PMH triple positive APS (+aCL IgG/IgM, +, lupus anticoagulant, +anti-beta2 glycoprotein IgG) CVA, CAD, aortic atherosclerosis, HTN, HLD, CKD Stage 3, on warfarin PTA. Patient presenting to ED with diarrhea x 6 weeks. Per notes from hematology (Dr. Dionne Milo at [facility]) patient's INR goal is 2-3.  INR from most recent outpatient  visits (11/29/20-12/20/20) have been > 2 (INRs ranging 2.2-3.2).  PTA schedule:  warfarin 2.5 mg Tues, Wed, Thurs, Sat, Sun warfarin 5 mg Mon, Fri  DDI: Abx and prednisone PTA aspirin 81 mg daily PTA quetiapine: enhanced anticoagulant effect   Goal of Therapy:  INR 2-3 Monitor platelets by anticoagulation protocol: Yes   Plan:  INR is therapeutic. Will give warfarin 2.5 mg x 1 (home dose). Daily INR. CBC at least every 3 days.   Eleonore Chiquito PharmD, BCPS 12/30/2020 9:58 AM

## 2020-12-30 NOTE — Discharge Instructions (Signed)
IV removed, EMS transported patient to home.  Discharge instructions reviewed with his daughter  [name] over the phone.  I was not able to change ankle dressing prior to discharge. [name] aware and she is prepared to do dressing changes until home health seeing patient next week.

## 2020-12-30 NOTE — TOC Transition Note (Signed)
Transition of Care [facility]) - CM/SW Discharge Note   Patient Details  Name: Kenneth Summers MRN: [id number] Date of Birth: 1945-03-04  Transition of Care [facility]) CM/SW Contact:  Kerin Salen, RN Phone Number: 12/30/2020, 8:40 AM   Clinical Narrative:  Patient to discharge home, Family member [name] called and says they are waiting for him and to call EMS. EMS called will be coming soon. TOC barriers resolved.     Final next level of care: [place] Barriers to Discharge: Barriers Resolved   Patient Goals and CMS Choice        Discharge Placement                  Name of family member notified: [name] Patient and family notified of of transfer: 12/30/20  Discharge Plan and Services   Discharge Planning Services: CM Consult                      HH Arranged: RN, PT, OT [facility] Agency: [place] Date [place]: 12/29/20   Representative spoke with at [place]: [place] ([employer]) Interventions     Readmission Risk Interventions No flowsheet data found.

## 2020-12-31 LAB — CULTURE, BLOOD (ROUTINE X 2)
Culture: NO GROWTH
Culture: NO GROWTH
Special Requests: ADEQUATE
Special Requests: ADEQUATE

## 2021-01-03 ENCOUNTER — Encounter: Payer: Medicare Other | Admitting: Occupational Therapy

## 2021-01-03 ENCOUNTER — Encounter: Payer: Medicare Other | Admitting: Speech Pathology

## 2021-01-03 ENCOUNTER — Ambulatory Visit: Payer: Medicare Other

## 2021-01-05 ENCOUNTER — Encounter: Payer: Medicare Other | Admitting: Occupational Therapy

## 2021-01-05 ENCOUNTER — Ambulatory Visit: Payer: Medicare Other

## 2021-01-10 ENCOUNTER — Ambulatory Visit: Payer: Medicare Other

## 2021-01-10 ENCOUNTER — Encounter: Payer: Medicare Other | Admitting: Occupational Therapy

## 2021-01-10 ENCOUNTER — Encounter: Payer: Medicare Other | Admitting: Speech Pathology

## 2021-01-12 ENCOUNTER — Ambulatory Visit: Payer: Medicare Other

## 2021-01-12 ENCOUNTER — Encounter: Payer: Medicare Other | Admitting: Occupational Therapy

## 2021-01-17 ENCOUNTER — Ambulatory Visit: Payer: Medicare Other

## 2021-01-17 ENCOUNTER — Encounter: Payer: Medicare Other | Admitting: Occupational Therapy

## 2021-01-17 ENCOUNTER — Encounter: Payer: Medicare Other | Admitting: Speech Pathology

## 2021-01-19 ENCOUNTER — Encounter: Payer: Medicare Other | Admitting: Occupational Therapy

## 2021-01-19 ENCOUNTER — Ambulatory Visit: Payer: Medicare Other

## 2021-01-24 ENCOUNTER — Encounter: Payer: Medicare Other | Admitting: Speech Pathology

## 2021-01-24 ENCOUNTER — Ambulatory Visit: Payer: Medicare Other

## 2021-01-24 ENCOUNTER — Encounter: Payer: Medicare Other | Admitting: Occupational Therapy

## 2021-01-26 ENCOUNTER — Encounter: Payer: Medicare Other | Admitting: Occupational Therapy

## 2021-01-26 ENCOUNTER — Ambulatory Visit: Payer: Medicare Other

## 2021-02-02 ENCOUNTER — Encounter: Payer: Medicare Other | Admitting: Occupational Therapy

## 2021-02-02 ENCOUNTER — Ambulatory Visit: Payer: Medicare Other

## 2021-02-09 ENCOUNTER — Ambulatory Visit: Payer: Medicare Other

## 2021-02-09 ENCOUNTER — Encounter: Payer: Medicare Other | Admitting: Occupational Therapy

## 2021-02-14 ENCOUNTER — Encounter: Payer: Medicare Other | Admitting: Occupational Therapy

## 2021-02-14 ENCOUNTER — Encounter: Payer: Medicare Other | Admitting: Speech Pathology

## 2021-02-14 ENCOUNTER — Ambulatory Visit: Payer: Medicare Other

## 2021-02-16 ENCOUNTER — Encounter: Payer: Medicare Other | Admitting: Occupational Therapy

## 2021-02-16 ENCOUNTER — Ambulatory Visit: Payer: Medicare Other

## 2021-02-16 ENCOUNTER — Telehealth: Payer: Self-pay | Admitting: Primary Care

## 2021-02-16 NOTE — Telephone Encounter (Signed)
No visit need at this time, will call.

## 2021-02-16 NOTE — Telephone Encounter (Signed)
Reached out to POA daughter RE scheduling pC visit. Have not seen pt since 9/22.

## 2021-02-21 ENCOUNTER — Ambulatory Visit: Payer: Medicare Other

## 2021-02-21 ENCOUNTER — Encounter: Payer: Medicare Other | Admitting: Speech Pathology

## 2021-02-21 ENCOUNTER — Encounter: Payer: Medicare Other | Admitting: Occupational Therapy

## 2021-02-23 ENCOUNTER — Ambulatory Visit: Payer: Medicare Other

## 2021-02-23 ENCOUNTER — Encounter: Payer: Medicare Other | Admitting: Occupational Therapy

## 2021-02-28 ENCOUNTER — Encounter: Payer: Medicare Other | Admitting: Speech Pathology

## 2021-02-28 ENCOUNTER — Encounter: Payer: Medicare Other | Admitting: Occupational Therapy

## 2021-02-28 ENCOUNTER — Ambulatory Visit: Payer: Medicare Other

## 2021-03-02 ENCOUNTER — Encounter: Payer: Medicare Other | Admitting: Occupational Therapy

## 2021-03-02 ENCOUNTER — Ambulatory Visit: Payer: Medicare Other

## 2021-03-07 ENCOUNTER — Encounter: Payer: Medicare Other | Admitting: Speech Pathology

## 2021-03-07 ENCOUNTER — Encounter: Payer: Medicare Other | Admitting: Occupational Therapy

## 2021-03-07 ENCOUNTER — Ambulatory Visit: Payer: Medicare Other

## 2021-03-09 ENCOUNTER — Encounter: Payer: Medicare Other | Admitting: Occupational Therapy

## 2021-03-09 ENCOUNTER — Ambulatory Visit: Payer: Medicare Other

## 2021-03-14 ENCOUNTER — Ambulatory Visit: Payer: Medicare Other

## 2021-03-14 ENCOUNTER — Encounter: Payer: Medicare Other | Admitting: Speech Pathology

## 2021-03-14 ENCOUNTER — Encounter: Payer: Medicare Other | Admitting: Occupational Therapy

## 2021-03-16 ENCOUNTER — Encounter: Payer: Medicare Other | Admitting: Occupational Therapy

## 2021-03-16 ENCOUNTER — Encounter: Payer: Medicare Other | Admitting: Speech Pathology

## 2021-03-16 ENCOUNTER — Ambulatory Visit: Payer: Medicare Other

## 2021-03-21 ENCOUNTER — Encounter: Payer: Medicare Other | Admitting: Occupational Therapy

## 2021-03-21 ENCOUNTER — Encounter: Payer: Medicare Other | Admitting: Speech Pathology

## 2021-03-21 ENCOUNTER — Ambulatory Visit: Payer: Medicare Other

## 2021-03-23 ENCOUNTER — Ambulatory Visit: Payer: Medicare Other

## 2021-03-23 ENCOUNTER — Encounter: Payer: Medicare Other | Admitting: Speech Pathology

## 2021-03-23 ENCOUNTER — Encounter: Payer: Medicare Other | Admitting: Occupational Therapy

## 2021-03-28 ENCOUNTER — Encounter: Payer: Medicare Other | Admitting: Speech Pathology

## 2021-03-28 ENCOUNTER — Encounter: Payer: Medicare Other | Admitting: Occupational Therapy

## 2021-03-28 ENCOUNTER — Ambulatory Visit: Payer: Medicare Other

## 2021-03-30 ENCOUNTER — Encounter: Payer: Medicare Other | Admitting: Speech Pathology

## 2021-03-30 ENCOUNTER — Encounter: Payer: Medicare Other | Admitting: Occupational Therapy

## 2021-03-30 ENCOUNTER — Ambulatory Visit: Payer: Medicare Other

## 2021-04-04 ENCOUNTER — Ambulatory Visit: Payer: Medicare Other

## 2021-04-04 ENCOUNTER — Encounter: Payer: Medicare Other | Admitting: Occupational Therapy

## 2021-04-04 ENCOUNTER — Encounter: Payer: Medicare Other | Admitting: Speech Pathology

## 2021-05-25 ENCOUNTER — Telehealth: Payer: Self-pay

## 2021-05-25 NOTE — Telephone Encounter (Signed)
956 am.  Follow up call made at the request of Ralene Bathe, NP.  No answer.  Message left requesting a call back.  ?

## 2021-08-11 ENCOUNTER — Telehealth: Payer: Self-pay

## 2021-08-11 NOTE — Telephone Encounter (Signed)
11 am.  Phone all made to daughter [name] to follow up on patient status and offer a home visit.  No answer.  Message left requesting a call back.

## 2021-08-22 IMAGING — DX DG CHEST 1V PORT
1 series · 1 of 1 positions shown · non-contrast
Comparison: 04/18/2013

CLINICAL DATA: Cough and fever. Sepsis.

EXAM:
PORTABLE CHEST 1 VIEW

[chest ap]
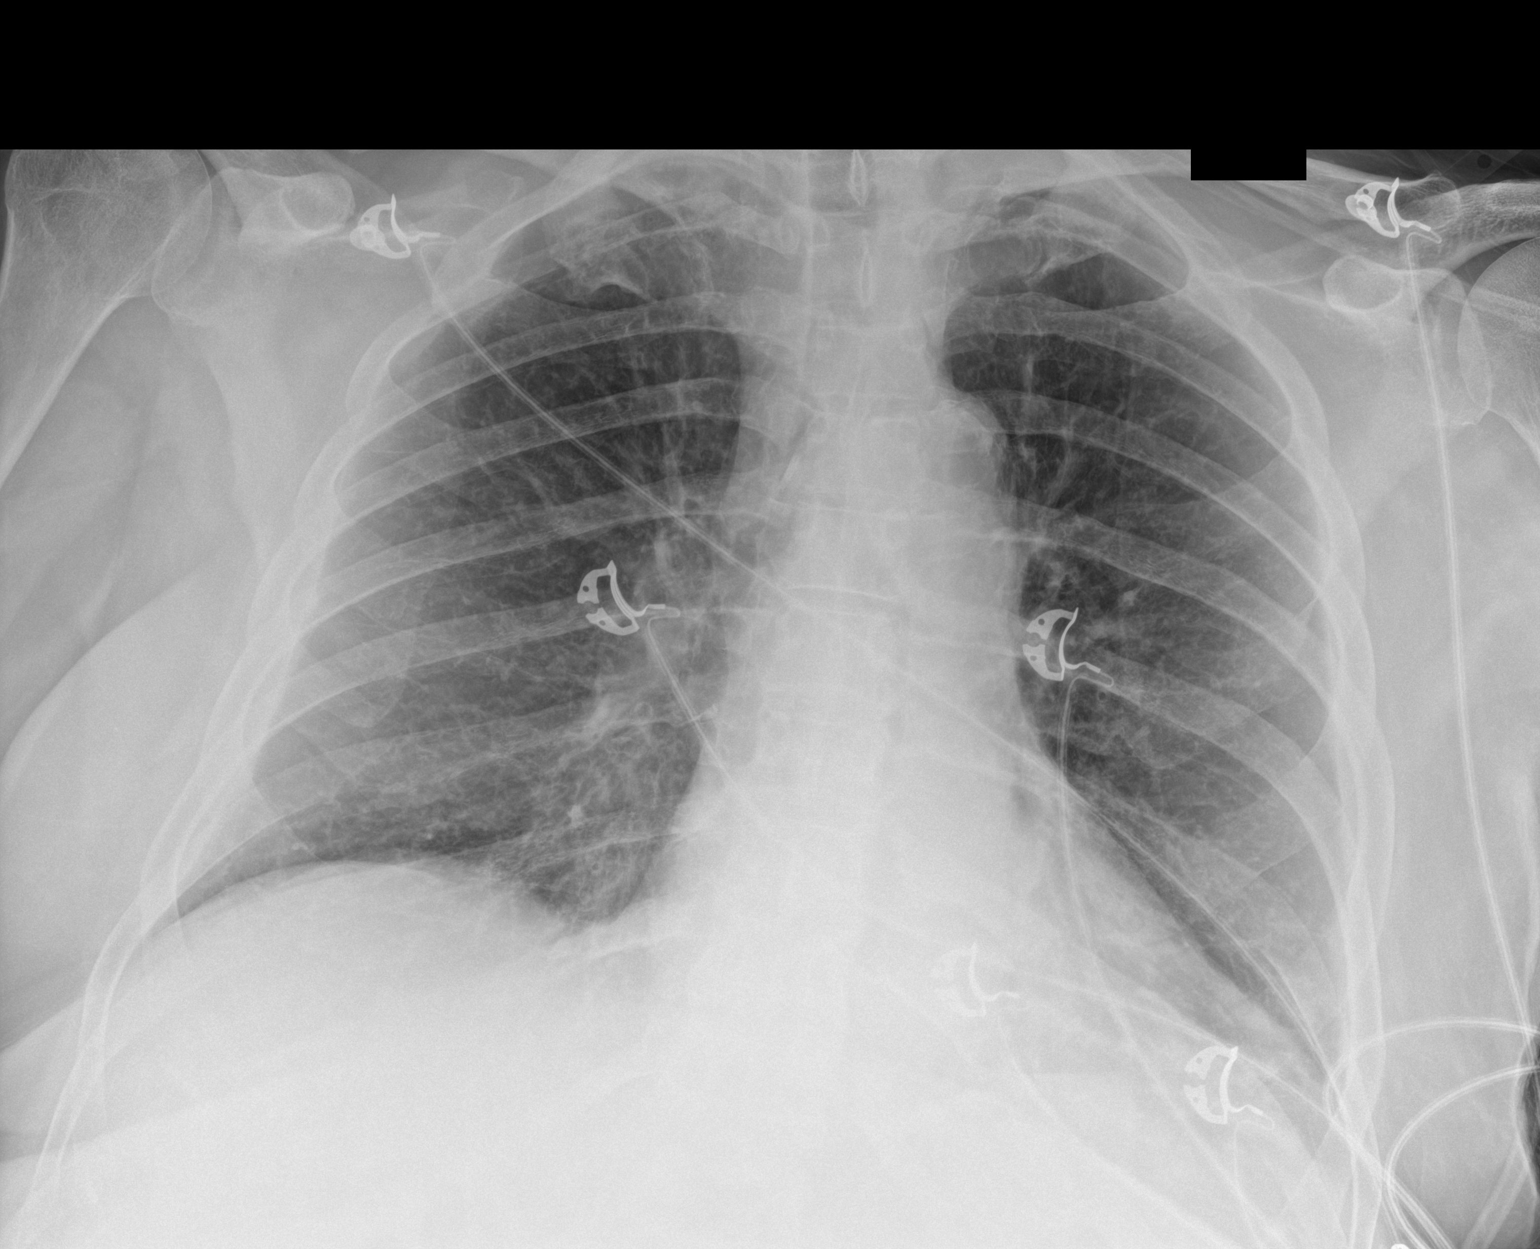

[1 of 1 positions shown; findings below may reference images not displayed]

FINDINGS: The heart size and pulmonary vascularity are normal. There is slight
new fullness in the region of main pulmonary artery just below the
aortic arch as compared to the prior exam. Lungs are clear. No acute
bone abnormality.

Aortic atherosclerosis.
IMPRESSION: Fullness in the region of the aortopulmonary window and main
pulmonary artery could represent new adenopathy.

Aortic Atherosclerosis (789OT-3NS.S).

## 2021-11-16 ENCOUNTER — Telehealth: Payer: Self-pay | Admitting: *Deleted

## 2021-11-16 NOTE — Patient Outreach (Signed)
  Care Coordination   Initial Visit Note   11/16/2021 Name: Kenneth Summers MRN: [id number] DOB: [birth year]  Kenneth Summers is a 76 y.o. year old male who sees Kenneth Maudlin, NP for primary care. I spoke with  [name] by phone today. Who suggested that I talk with their daughter, Kenneth Summers, regarding our services. NP provided phone number and sent text to daughter with my information.  Daughter Kenneth Summers returned NP call and we discussed pt needs. They currently aren't able to get timely service due to having to call in advance for transportation and so they end up calling 911 and going to the hospital for some non urgent needs. He is  very tall 6'4" and uses an electric WC which can only be accommodated in certain vehicles.  They are paying out of pocket for 4 hours a day nurse aid care at $27/hour. They are not eligible for Medicaid.  What matters to the patients health and wellness today?  Per Kenneth Summers, transportation can be a problem. Pt has to [facility] a [facility] and he is very tall which is sometimes hard to accommodate.    Goals Addressed   None     SDOH assessments and interventions completed:  Yes  SDOH Interventions Today    Flowsheet Row Most Recent Value  SDOH Interventions   Food Insecurity Interventions Intervention Not Indicated  Housing Interventions Intervention Not Indicated  Transportation Interventions Ambulatory [id number] Order        Care Coordination Interventions Activated:  Yes  Care Coordination Interventions:  Yes, provided   Follow up plan: Follow up call scheduled for 1 WEEK.    Encounter Outcome:  Pt. Request to Call Back   PROVIDED RESOURCES FOR PRIMARY CARE AT HOME: [place]. ALSO [employer] AND [employer]COM FOR IN HOME CARE AT MORE REASONABLE COST THAT THEY ARE CURRENTLY PAYING.  Eulah Pont. Myrtie Neither, MSN, [facility] Gerontological Nurse Practitioner [facility] Care Management [phone]

## 2021-11-23 ENCOUNTER — Encounter: Payer: Self-pay | Admitting: *Deleted

## 2021-11-23 ENCOUNTER — Telehealth: Payer: Self-pay | Admitting: *Deleted

## 2021-11-23 NOTE — Patient Outreach (Signed)
  Care Coordination   11/23/2021 Name: Kenneth Summers MRN: [id number] DOB: 10/27/1945   Care Coordination Outreach Attempts:  An unsuccessful telephone outreach was attempted today to offer the patient information about available care coordination services as a benefit of their health plan.   Follow Up Plan:  Additional outreach attempts will be made to offer the patient care coordination information and services.   Encounter Outcome:  No Answer LEFT A MESSAGE FOR DAUGHTER TO RETURN MY CALL,FOR FOLLOW UP ON RESOURCES PREVIOUSLY GIVEN.THN Tip this will not be part of the note when signed-REQUIRED REPORT FIELD DO NOT DELETE (Optional):[id number]} Care Coordination Interventions Activated:  No   Care Coordination Interventions:  No, not indicated    SIG Samie Barclift C. Myrtie Neither, MSN, [facility] Gerontological Nurse Practitioner [facility] Care Management [phone]

## 2021-11-24 ENCOUNTER — Encounter: Payer: Self-pay | Admitting: *Deleted

## 2022-10-02 DIAGNOSIS — I69354 Hemiplegia and hemiparesis following cerebral infarction affecting left non-dominant side: Secondary | ICD-10-CM | POA: Insufficient documentation

## 2022-12-24 IMAGING — CT CT HEAD W/O CM
3 series · 16 of 47 positions shown, 19 images · non-contrast
Comparison: None.

CLINICAL DATA: Headache.

EXAM:
CT HEAD WITHOUT CONTRAST
TECHNIQUE: Contiguous axial images were obtained from the base of the skull
through the vertex without intravenous contrast.

[Series 2: head wo · axial · 0.40mm/px · z∈[-143,-18]mm · 10 of 31 slices shown, 13 images]
[im 3/31  brain]
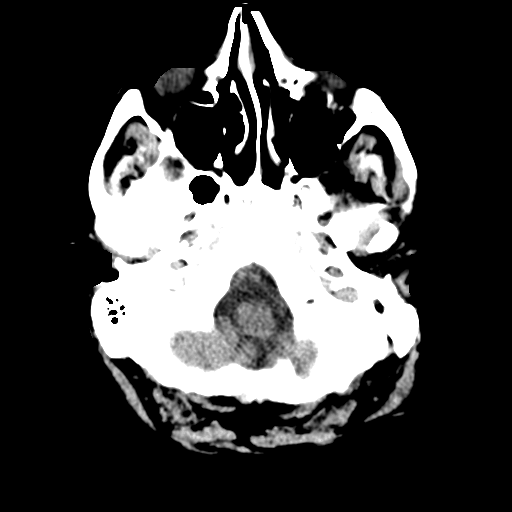
[im 3/31  bone]
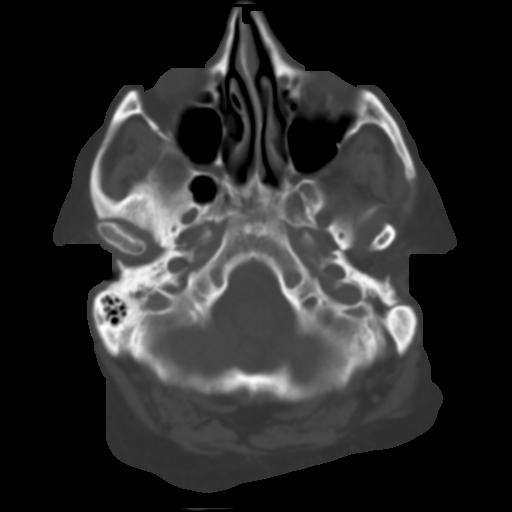
[im 6/31  brain]
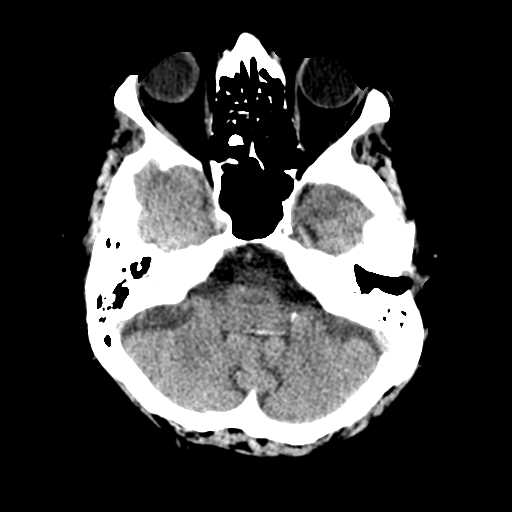
[im 9/31  brain]
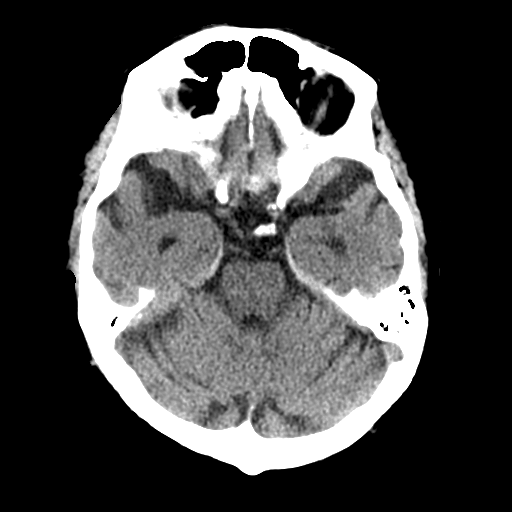
[im 11/31  brain]
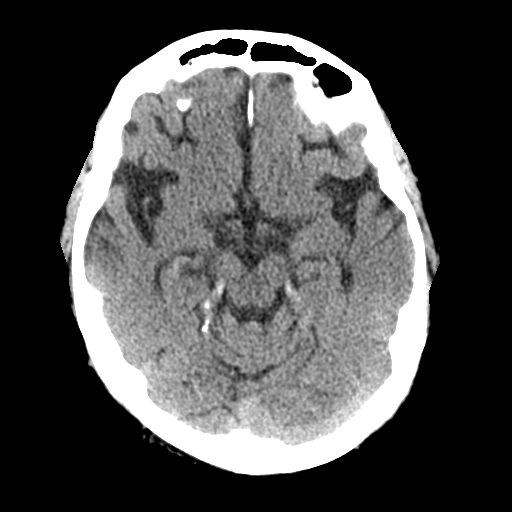
[im 14/31  brain]
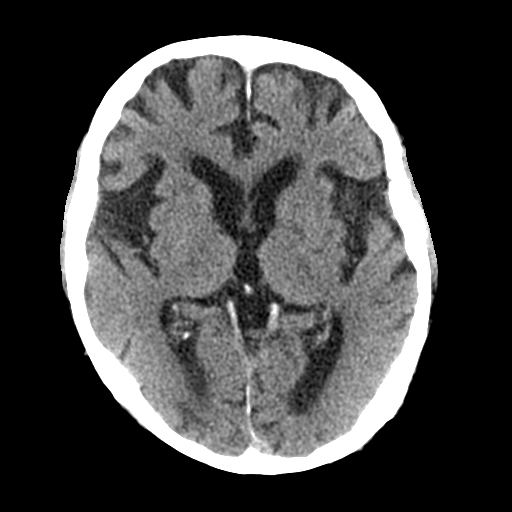
[im 14/31  bone]
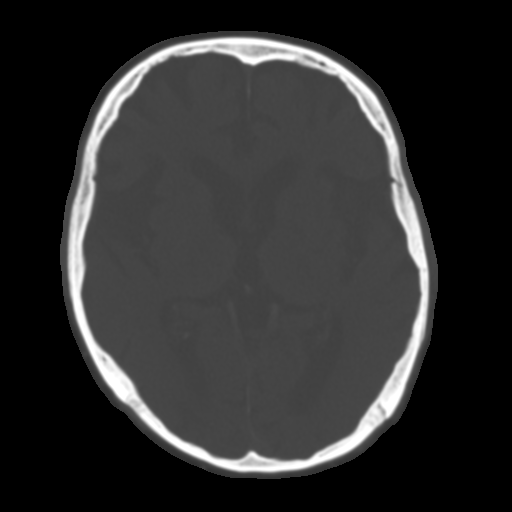
[im 17/31  brain]
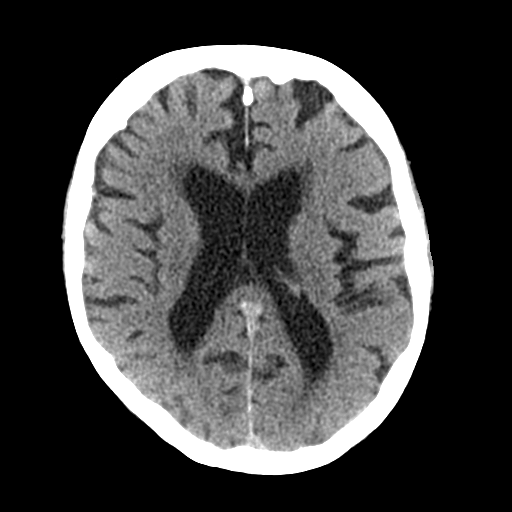
[im 20/31  brain]
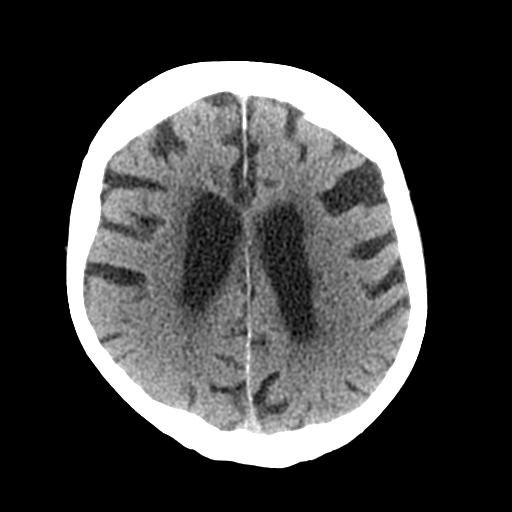
[im 23/31  brain]
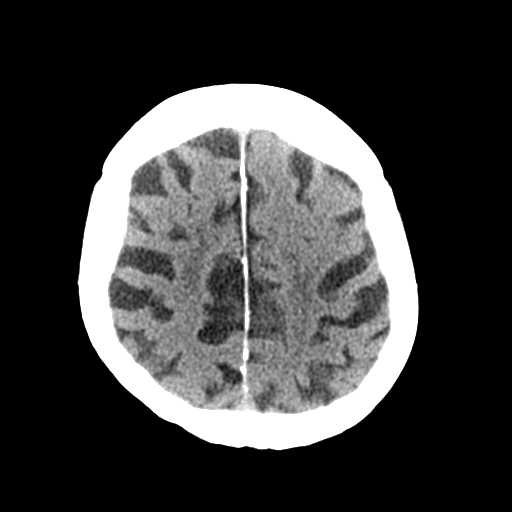
[im 25/31  brain]
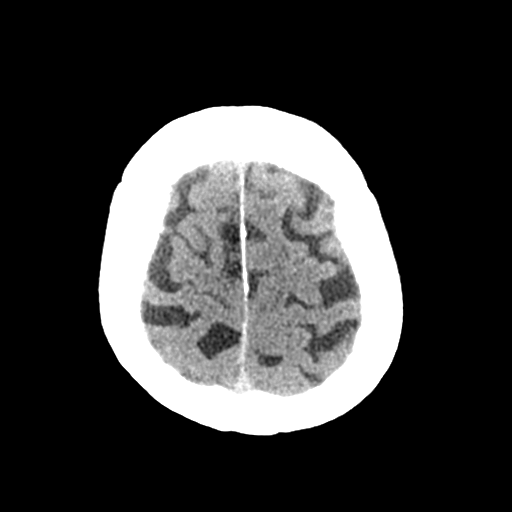
[im 25/31  bone]
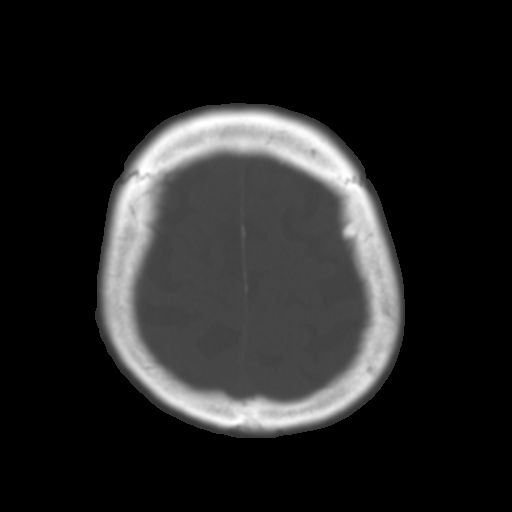
[im 28/31  brain]
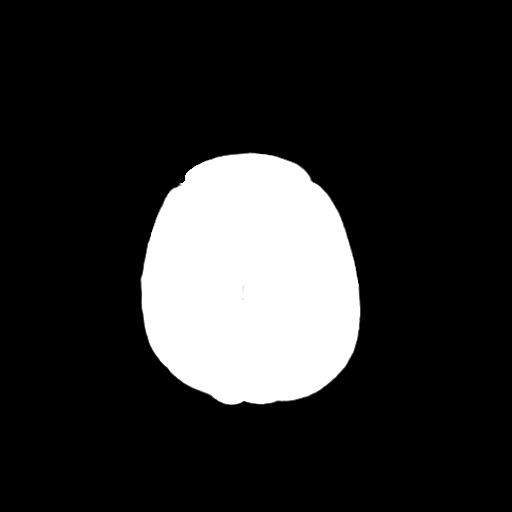

[Series 4: coronal soft tissue · coronal · 0.31mm/px · 3 of 69 slices shown]
[im 23/69  brain]
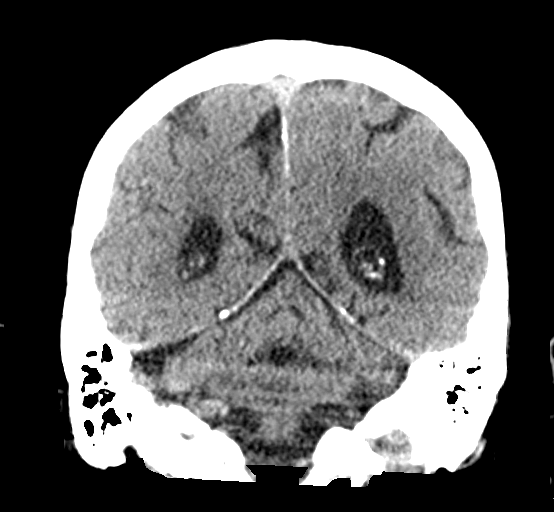
[im 31/69  brain]
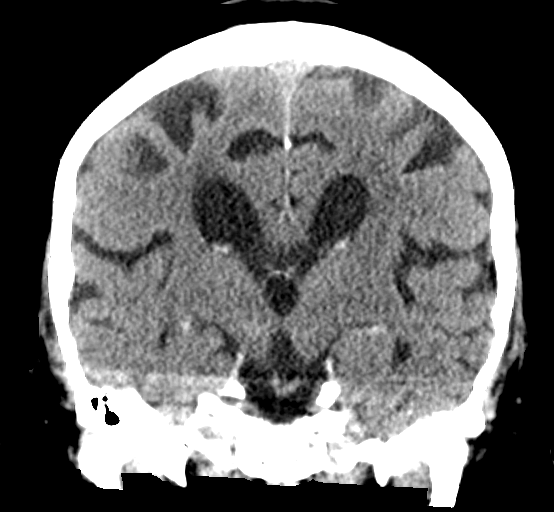
[im 38/69  brain]
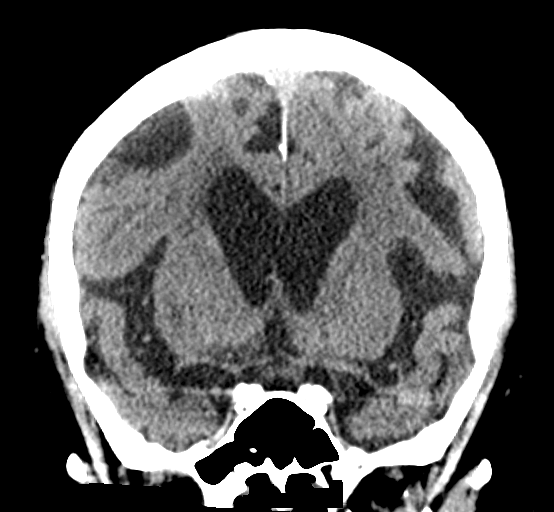

[Series 5: sagittal soft tissue · sagittal · 0.31mm/px · 3 of 61 slices shown]
[im 21/61  brain]
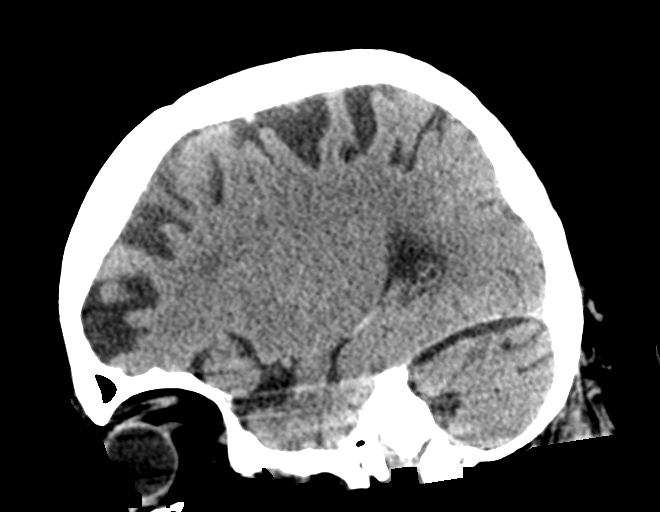
[im 31/61  brain]
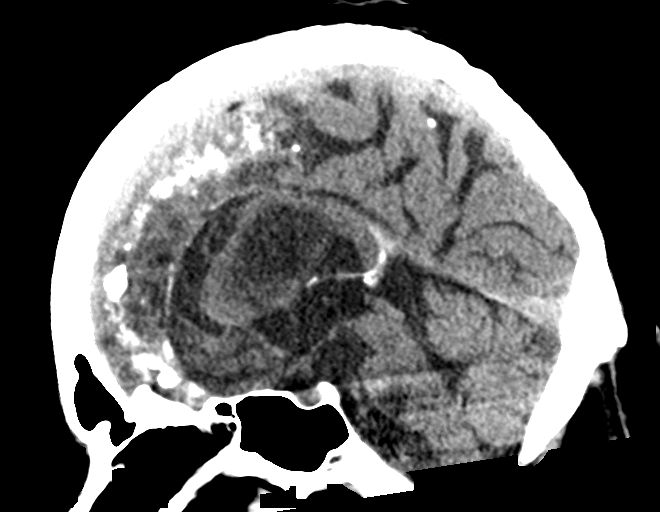
[im 41/61  brain]
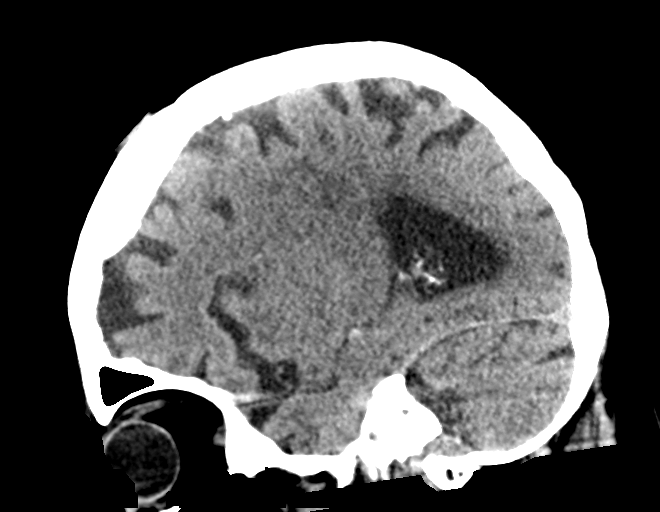

[16 of 47 positions shown; findings below may reference images not displayed]

FINDINGS: Brain: Mild diffuse cortical atrophy is noted. No mass effect or
midline shift is noted. Ventricular size is within normal limits.
There is no evidence of mass lesion, hemorrhage or acute infarction.

Vascular: No hyperdense vessel or unexpected calcification.

Skull: Normal. Negative for fracture or focal lesion.

Sinuses/Orbits: No acute finding.

Other: None.
IMPRESSION: Mild diffuse cortical atrophy. No acute intracranial abnormality
seen.

## 2023-07-29 ENCOUNTER — Emergency Department

## 2023-07-29 ENCOUNTER — Inpatient Hospital Stay
Admission: EM | Admit: 2023-07-29 | Discharge: 2023-08-07 | DRG: 871 | Disposition: E | Attending: Family Medicine | Admitting: Family Medicine

## 2023-07-29 ENCOUNTER — Other Ambulatory Visit: Payer: Self-pay

## 2023-07-29 ENCOUNTER — Encounter: Payer: Self-pay | Admitting: Emergency Medicine

## 2023-07-29 DIAGNOSIS — D6861 Antiphospholipid syndrome: Secondary | ICD-10-CM | POA: Diagnosis present

## 2023-07-29 DIAGNOSIS — G40909 Epilepsy, unspecified, not intractable, without status epilepticus: Secondary | ICD-10-CM | POA: Diagnosis present

## 2023-07-29 DIAGNOSIS — A4189 Other specified sepsis: Principal | ICD-10-CM | POA: Diagnosis present

## 2023-07-29 DIAGNOSIS — R9431 Abnormal electrocardiogram [ECG] [EKG]: Secondary | ICD-10-CM | POA: Diagnosis not present

## 2023-07-29 DIAGNOSIS — A419 Sepsis, unspecified organism: Principal | ICD-10-CM | POA: Diagnosis present

## 2023-07-29 DIAGNOSIS — I2510 Atherosclerotic heart disease of native coronary artery without angina pectoris: Secondary | ICD-10-CM | POA: Diagnosis present

## 2023-07-29 DIAGNOSIS — I2489 Other forms of acute ischemic heart disease: Secondary | ICD-10-CM | POA: Diagnosis present

## 2023-07-29 DIAGNOSIS — Z515 Encounter for palliative care: Secondary | ICD-10-CM | POA: Diagnosis not present

## 2023-07-29 DIAGNOSIS — G9341 Metabolic encephalopathy: Secondary | ICD-10-CM | POA: Diagnosis present

## 2023-07-29 DIAGNOSIS — L89312 Pressure ulcer of right buttock, stage 2: Secondary | ICD-10-CM | POA: Diagnosis present

## 2023-07-29 DIAGNOSIS — I739 Peripheral vascular disease, unspecified: Secondary | ICD-10-CM | POA: Diagnosis present

## 2023-07-29 DIAGNOSIS — U071 COVID-19: Secondary | ICD-10-CM | POA: Diagnosis present

## 2023-07-29 DIAGNOSIS — E785 Hyperlipidemia, unspecified: Secondary | ICD-10-CM | POA: Diagnosis present

## 2023-07-29 DIAGNOSIS — L89321 Pressure ulcer of left buttock, stage 1: Secondary | ICD-10-CM | POA: Diagnosis present

## 2023-07-29 DIAGNOSIS — Z604 Social exclusion and rejection: Secondary | ICD-10-CM | POA: Diagnosis present

## 2023-07-29 DIAGNOSIS — Z7189 Other specified counseling: Secondary | ICD-10-CM | POA: Diagnosis not present

## 2023-07-29 DIAGNOSIS — E559 Vitamin D deficiency, unspecified: Secondary | ICD-10-CM | POA: Diagnosis present

## 2023-07-29 DIAGNOSIS — J1282 Pneumonia due to coronavirus disease 2019: Secondary | ICD-10-CM | POA: Diagnosis present

## 2023-07-29 DIAGNOSIS — Z789 Other specified health status: Secondary | ICD-10-CM | POA: Diagnosis not present

## 2023-07-29 DIAGNOSIS — F32A Depression, unspecified: Secondary | ICD-10-CM | POA: Diagnosis not present

## 2023-07-29 DIAGNOSIS — G4733 Obstructive sleep apnea (adult) (pediatric): Secondary | ICD-10-CM | POA: Diagnosis present

## 2023-07-29 DIAGNOSIS — G2581 Restless legs syndrome: Secondary | ICD-10-CM | POA: Diagnosis present

## 2023-07-29 DIAGNOSIS — M069 Rheumatoid arthritis, unspecified: Secondary | ICD-10-CM | POA: Diagnosis present

## 2023-07-29 DIAGNOSIS — Z888 Allergy status to other drugs, medicaments and biological substances status: Secondary | ICD-10-CM

## 2023-07-29 DIAGNOSIS — Z7901 Long term (current) use of anticoagulants: Secondary | ICD-10-CM

## 2023-07-29 DIAGNOSIS — L8990 Pressure ulcer of unspecified site, unspecified stage: Secondary | ICD-10-CM | POA: Diagnosis not present

## 2023-07-29 DIAGNOSIS — Z66 Do not resuscitate: Secondary | ICD-10-CM | POA: Diagnosis present

## 2023-07-29 DIAGNOSIS — D802 Selective deficiency of immunoglobulin A [IgA]: Secondary | ICD-10-CM | POA: Diagnosis present

## 2023-07-29 DIAGNOSIS — G629 Polyneuropathy, unspecified: Secondary | ICD-10-CM | POA: Diagnosis present

## 2023-07-29 DIAGNOSIS — E872 Acidosis, unspecified: Secondary | ICD-10-CM | POA: Diagnosis present

## 2023-07-29 DIAGNOSIS — Z7982 Long term (current) use of aspirin: Secondary | ICD-10-CM

## 2023-07-29 DIAGNOSIS — R6520 Severe sepsis without septic shock: Secondary | ICD-10-CM | POA: Diagnosis present

## 2023-07-29 DIAGNOSIS — F17210 Nicotine dependence, cigarettes, uncomplicated: Secondary | ICD-10-CM | POA: Diagnosis present

## 2023-07-29 DIAGNOSIS — I10 Essential (primary) hypertension: Secondary | ICD-10-CM | POA: Diagnosis present

## 2023-07-29 DIAGNOSIS — K219 Gastro-esophageal reflux disease without esophagitis: Secondary | ICD-10-CM | POA: Diagnosis present

## 2023-07-29 DIAGNOSIS — Z79899 Other long term (current) drug therapy: Secondary | ICD-10-CM

## 2023-07-29 DIAGNOSIS — Z7952 Long term (current) use of systemic steroids: Secondary | ICD-10-CM

## 2023-07-29 DIAGNOSIS — I63531 Cerebral infarction due to unspecified occlusion or stenosis of right posterior cerebral artery: Secondary | ICD-10-CM | POA: Diagnosis present

## 2023-07-29 DIAGNOSIS — N183 Chronic kidney disease, stage 3 unspecified: Secondary | ICD-10-CM | POA: Diagnosis present

## 2023-07-29 DIAGNOSIS — J690 Pneumonitis due to inhalation of food and vomit: Secondary | ICD-10-CM | POA: Diagnosis present

## 2023-07-29 DIAGNOSIS — K589 Irritable bowel syndrome without diarrhea: Secondary | ICD-10-CM | POA: Diagnosis present

## 2023-07-29 DIAGNOSIS — N400 Enlarged prostate without lower urinary tract symptoms: Secondary | ICD-10-CM | POA: Diagnosis present

## 2023-07-29 DIAGNOSIS — M329 Systemic lupus erythematosus, unspecified: Secondary | ICD-10-CM | POA: Diagnosis present

## 2023-07-29 DIAGNOSIS — Z9861 Coronary angioplasty status: Secondary | ICD-10-CM

## 2023-07-29 DIAGNOSIS — Z8673 Personal history of transient ischemic attack (TIA), and cerebral infarction without residual deficits: Secondary | ICD-10-CM

## 2023-07-29 DIAGNOSIS — Z85828 Personal history of other malignant neoplasm of skin: Secondary | ICD-10-CM

## 2023-07-29 DIAGNOSIS — N401 Enlarged prostate with lower urinary tract symptoms: Secondary | ICD-10-CM | POA: Diagnosis not present

## 2023-07-29 DIAGNOSIS — J9601 Acute respiratory failure with hypoxia: Secondary | ICD-10-CM | POA: Diagnosis present

## 2023-07-29 DIAGNOSIS — Z8262 Family history of osteoporosis: Secondary | ICD-10-CM

## 2023-07-29 DIAGNOSIS — I69354 Hemiplegia and hemiparesis following cerebral infarction affecting left non-dominant side: Secondary | ICD-10-CM | POA: Diagnosis not present

## 2023-07-29 DIAGNOSIS — J189 Pneumonia, unspecified organism: Secondary | ICD-10-CM | POA: Diagnosis present

## 2023-07-29 DIAGNOSIS — R7989 Other specified abnormal findings of blood chemistry: Secondary | ICD-10-CM | POA: Diagnosis not present

## 2023-07-29 DIAGNOSIS — Z825 Family history of asthma and other chronic lower respiratory diseases: Secondary | ICD-10-CM

## 2023-07-29 DIAGNOSIS — F1010 Alcohol abuse, uncomplicated: Secondary | ICD-10-CM | POA: Diagnosis present

## 2023-07-29 DIAGNOSIS — N1831 Chronic kidney disease, stage 3a: Secondary | ICD-10-CM | POA: Diagnosis not present

## 2023-07-29 DIAGNOSIS — F329 Major depressive disorder, single episode, unspecified: Secondary | ICD-10-CM | POA: Diagnosis present

## 2023-07-29 DIAGNOSIS — R531 Weakness: Secondary | ICD-10-CM | POA: Diagnosis present

## 2023-07-29 DIAGNOSIS — R1310 Dysphagia, unspecified: Secondary | ICD-10-CM | POA: Diagnosis present

## 2023-07-29 DIAGNOSIS — G4700 Insomnia, unspecified: Secondary | ICD-10-CM | POA: Diagnosis present

## 2023-07-29 DIAGNOSIS — Z5986 Financial insecurity: Secondary | ICD-10-CM

## 2023-07-29 DIAGNOSIS — Z7401 Bed confinement status: Secondary | ICD-10-CM

## 2023-07-29 DIAGNOSIS — Z823 Family history of stroke: Secondary | ICD-10-CM

## 2023-07-29 LAB — MAGNESIUM: Magnesium: 1.8 mg/dL (ref 1.7–2.4)

## 2023-07-29 LAB — CBC WITH DIFFERENTIAL/PLATELET
Abs Immature Granulocytes: 0.03 10*3/uL (ref 0.00–0.07)
Basophils Absolute: 0 10*3/uL (ref 0.0–0.1)
Basophils Relative: 0 %
Eosinophils Absolute: 0.1 10*3/uL (ref 0.0–0.5)
Eosinophils Relative: 0 %
HCT: 44.7 % (ref 39.0–52.0)
Hemoglobin: 14.6 g/dL (ref 13.0–17.0)
Immature Granulocytes: 0 %
Lymphocytes Relative: 35 %
Lymphs Abs: 4 10*3/uL (ref 0.7–4.0)
MCH: 31.5 pg (ref 26.0–34.0)
MCHC: 32.7 g/dL (ref 30.0–36.0)
MCV: 96.5 fL (ref 80.0–100.0)
Monocytes Absolute: 1.3 10*3/uL — ABNORMAL HIGH (ref 0.1–1.0)
Monocytes Relative: 11 %
Neutro Abs: 6.2 10*3/uL (ref 1.7–7.7)
Neutrophils Relative %: 54 %
Platelets: 171 10*3/uL (ref 150–400)
RBC: 4.63 MIL/uL (ref 4.22–5.81)
RDW: 14.1 % (ref 11.5–15.5)
WBC: 11.6 10*3/uL — ABNORMAL HIGH (ref 4.0–10.5)
nRBC: 0 % (ref 0.0–0.2)

## 2023-07-29 LAB — COMPREHENSIVE METABOLIC PANEL WITH GFR
ALT: 30 U/L (ref 0–44)
AST: 47 U/L — ABNORMAL HIGH (ref 15–41)
Albumin: 3.2 g/dL — ABNORMAL LOW (ref 3.5–5.0)
Alkaline Phosphatase: 67 U/L (ref 38–126)
Anion gap: 9 (ref 5–15)
BUN: 24 mg/dL — ABNORMAL HIGH (ref 8–23)
CO2: 28 mmol/L (ref 22–32)
Calcium: 8.4 mg/dL — ABNORMAL LOW (ref 8.9–10.3)
Chloride: 98 mmol/L (ref 98–111)
Creatinine, Ser: 1.14 mg/dL (ref 0.61–1.24)
GFR, Estimated: >60 mL/min (ref >60)
Glucose, Bld: 102 mg/dL — ABNORMAL HIGH (ref 70–99)
Potassium: 4.1 mmol/L (ref 3.5–5.1)
Sodium: 135 mmol/L (ref 135–145)
Total Bilirubin: 1.4 mg/dL — ABNORMAL HIGH (ref 0.0–1.2)
Total Protein: 7.2 g/dL (ref 6.5–8.1)

## 2023-07-29 LAB — URINALYSIS, W/ REFLEX TO CULTURE (INFECTION SUSPECTED)
Bacteria, UA: NONE SEEN
Bilirubin Urine: NEGATIVE
Glucose, UA: NEGATIVE mg/dL
Hgb urine dipstick: NEGATIVE
Ketones, ur: NEGATIVE mg/dL
Leukocytes,Ua: NEGATIVE
Nitrite: NEGATIVE
Protein, ur: NEGATIVE mg/dL
Specific Gravity, Urine: 1.011 (ref 1.005–1.030)
pH: 7 (ref 5.0–8.0)

## 2023-07-29 LAB — BLOOD GAS, VENOUS
Acid-Base Excess: 4 mmol/L — ABNORMAL HIGH (ref 0.0–2.0)
Bicarbonate: 29.2 mmol/L — ABNORMAL HIGH (ref 20.0–28.0)
O2 Saturation: 71.4 %
Patient temperature: 37
pCO2, Ven: 45 mmHg (ref 44–60)
pH, Ven: 7.42 (ref 7.25–7.43)
pO2, Ven: 40 mmHg (ref 32–45)

## 2023-07-29 LAB — LACTIC ACID, PLASMA
Lactic Acid, Venous: 2.2 mmol/L (ref 0.5–1.9)
Lactic Acid, Venous: 2.7 mmol/L (ref 0.5–1.9)
Lactic Acid, Venous: 2.6 mmol/L (ref 0.5–1.9)
Lactic Acid, Venous: 2.1 mmol/L (ref 0.5–1.9)

## 2023-07-29 LAB — GLUCOSE, CAPILLARY: Glucose-Capillary: 86 mg/dL (ref 70–99)

## 2023-07-29 LAB — PROTIME-INR
INR: 2.3 — ABNORMAL HIGH (ref 0.8–1.2)
Prothrombin Time: 25.5 s — ABNORMAL HIGH (ref 11.4–15.2)

## 2023-07-29 LAB — TROPONIN I (HIGH SENSITIVITY)
Troponin I (High Sensitivity): 97 ng/L — ABNORMAL HIGH (ref <18)
Troponin I (High Sensitivity): 133 ng/L (ref <18)
Troponin I (High Sensitivity): 162 ng/L (ref <18)

## 2023-07-29 LAB — STREP PNEUMONIAE URINARY ANTIGEN: Strep Pneumo Urinary Antigen: NEGATIVE

## 2023-07-29 LAB — MRSA NEXT GEN BY PCR, NASAL: MRSA by PCR Next Gen: NOT DETECTED

## 2023-07-29 MED ORDER — SODIUM CHLORIDE 0.9 % IV SOLN
500.0000 mg | INTRAVENOUS | Status: DC
  Administered 2023-07-29 – 2023-07-31 (×3): 500 mg via INTRAVENOUS
  Filled 2023-07-29 (×5): qty 5

## 2023-07-29 MED ORDER — WARFARIN SODIUM 2.5 MG PO TABS: 2.5000 mg | ORAL_TABLET | Freq: Once | ORAL | Status: DC

## 2023-07-29 MED ORDER — ONDANSETRON HCL 4 MG PO TABS: 4.0000 mg | ORAL_TABLET | Freq: Four times a day (QID) | ORAL | Status: DC | PRN

## 2023-07-29 MED ORDER — SODIUM CHLORIDE 0.9 % IV SOLN
2.0000 g | Freq: Once | INTRAVENOUS | Status: AC
  Administered 2023-07-29: 2 g via INTRAVENOUS
  Filled 2023-07-29: qty 12.5

## 2023-07-29 MED ORDER — FLUOXETINE HCL 20 MG PO CAPS
40.0000 mg | ORAL_CAPSULE | Freq: Every day | ORAL | Status: DC
  Administered 2023-07-29 – 2023-08-01 (×4): 40 mg via ORAL
  Filled 2023-07-29 (×5): qty 2

## 2023-07-29 MED ORDER — ASPIRIN 81 MG PO TBEC
81.0000 mg | DELAYED_RELEASE_TABLET | Freq: Every day | ORAL | Status: DC
  Administered 2023-07-29 – 2023-07-31 (×3): 81 mg via ORAL
  Filled 2023-07-29 (×3): qty 1

## 2023-07-29 MED ORDER — DIPHENOXYLATE-ATROPINE 2.5-0.025 MG PO TABS: 1.0000 | ORAL_TABLET | Freq: Four times a day (QID) | ORAL | Status: DC | PRN

## 2023-07-29 MED ORDER — ROPINIROLE HCL 1 MG PO TABS
2.0000 mg | ORAL_TABLET | Freq: Every day | ORAL | Status: DC
  Administered 2023-07-29 – 2023-08-01 (×4): 2 mg via ORAL
  Filled 2023-07-29: qty 2
  Filled 2023-07-29 (×4): qty 8

## 2023-07-29 MED ORDER — WARFARIN 1.25 MG HALF TABLET: 1.2500 mg | ORAL_TABLET | Freq: Once | ORAL | Status: DC

## 2023-07-29 MED ORDER — LACTATED RINGERS IV SOLN: INTRAVENOUS | Status: AC

## 2023-07-29 MED ORDER — VANCOMYCIN HCL IN DEXTROSE 1-5 GM/200ML-% IV SOLN
1000.0000 mg | Freq: Once | INTRAVENOUS | Status: AC
  Administered 2023-07-29: 1000 mg via INTRAVENOUS
  Filled 2023-07-29: qty 200

## 2023-07-29 MED ORDER — CALCIUM CARBONATE 1250 (500 CA) MG PO TABS
1.0000 | ORAL_TABLET | Freq: Two times a day (BID) | ORAL | Status: DC
  Administered 2023-07-30 – 2023-08-01 (×4): 1250 mg via ORAL
  Filled 2023-07-29 (×5): qty 1

## 2023-07-29 MED ORDER — METRONIDAZOLE 500 MG/100ML IV SOLN
500.0000 mg | Freq: Once | INTRAVENOUS | Status: AC
  Administered 2023-07-29: 500 mg via INTRAVENOUS
  Filled 2023-07-29: qty 100

## 2023-07-29 MED ORDER — ROPINIROLE HCL 1 MG PO TABS
2.0000 mg | ORAL_TABLET | Freq: Every day | ORAL | Status: DC
  Filled 2023-07-29: qty 2

## 2023-07-29 MED ORDER — ACETAMINOPHEN 325 MG RE SUPP
650.0000 mg | Freq: Once | RECTAL | Status: AC
  Administered 2023-07-29: 650 mg via RECTAL
  Filled 2023-07-29: qty 2

## 2023-07-29 MED ORDER — WARFARIN - PHARMACIST DOSING INPATIENT: Freq: Every day | Status: DC

## 2023-07-29 MED ORDER — LACTATED RINGERS IV BOLUS (SEPSIS)
1000.0000 mL | Freq: Once | INTRAVENOUS | Status: AC
  Administered 2023-07-29: 1000 mL via INTRAVENOUS

## 2023-07-29 MED ORDER — IOHEXOL 300 MG/ML  SOLN
100.0000 mL | Freq: Once | INTRAMUSCULAR | Status: AC | PRN
  Administered 2023-07-29: 100 mL via INTRAVENOUS

## 2023-07-29 MED ORDER — ATORVASTATIN CALCIUM 20 MG PO TABS
80.0000 mg | ORAL_TABLET | Freq: Every day | ORAL | Status: DC
  Administered 2023-07-29 – 2023-07-31 (×3): 80 mg via ORAL
  Filled 2023-07-29: qty 1
  Filled 2023-07-29: qty 4
  Filled 2023-07-29: qty 1

## 2023-07-29 MED ORDER — MAGNESIUM OXIDE -MG SUPPLEMENT 400 (240 MG) MG PO TABS
400.0000 mg | ORAL_TABLET | Freq: Every day | ORAL | Status: DC
  Administered 2023-07-29 – 2023-08-01 (×4): 400 mg via ORAL
  Filled 2023-07-29 (×5): qty 1

## 2023-07-29 MED ORDER — ACETAMINOPHEN 650 MG RE SUPP: 650.0000 mg | Freq: Four times a day (QID) | RECTAL | Status: DC | PRN

## 2023-07-29 MED ORDER — ACETAMINOPHEN 325 MG PO TABS
650.0000 mg | ORAL_TABLET | Freq: Four times a day (QID) | ORAL | Status: DC | PRN
  Administered 2023-07-29 – 2023-08-01 (×4): 650 mg via ORAL
  Filled 2023-07-29 (×4): qty 2

## 2023-07-29 MED ORDER — CHLORHEXIDINE GLUCONATE CLOTH 2 % EX PADS
6.0000 | MEDICATED_PAD | Freq: Every day | CUTANEOUS | Status: DC
  Administered 2023-07-29 – 2023-07-31 (×3): 6 via TOPICAL

## 2023-07-29 MED ORDER — PNEUMOCOCCAL 20-VAL CONJ VACC 0.5 ML IM SUSY: 0.5000 mL | PREFILLED_SYRINGE | INTRAMUSCULAR | Status: DC

## 2023-07-29 MED ORDER — ONDANSETRON HCL 4 MG/2ML IJ SOLN
4.0000 mg | Freq: Four times a day (QID) | INTRAMUSCULAR | Status: DC | PRN
  Administered 2023-07-30: 4 mg via INTRAVENOUS
  Filled 2023-07-29: qty 2

## 2023-07-29 MED ORDER — WARFARIN SODIUM 2.5 MG PO TABS
3.7500 mg | ORAL_TABLET | Freq: Once | ORAL | Status: DC
  Filled 2023-07-29: qty 1

## 2023-07-29 MED ORDER — FERROUS SULFATE 325 (65 FE) MG PO TABS
325.0000 mg | ORAL_TABLET | Freq: Two times a day (BID) | ORAL | Status: DC
  Administered 2023-07-30 – 2023-08-01 (×5): 325 mg via ORAL
  Filled 2023-07-29 (×5): qty 1

## 2023-07-29 MED ORDER — GABAPENTIN 300 MG PO CAPS
1200.0000 mg | ORAL_CAPSULE | Freq: Every day | ORAL | Status: DC
  Administered 2023-07-29 – 2023-08-01 (×4): 1200 mg via ORAL
  Filled 2023-07-29 (×2): qty 3
  Filled 2023-07-29 (×2): qty 4

## 2023-07-29 MED ORDER — VITAMIN D 25 MCG (1000 UNIT) PO TABS
2000.0000 [IU] | ORAL_TABLET | Freq: Every day | ORAL | Status: DC
  Administered 2023-07-29 – 2023-08-01 (×4): 2000 [IU] via ORAL
  Filled 2023-07-29 (×4): qty 2

## 2023-07-29 MED ORDER — OLOPATADINE HCL 0.1 % OP SOLN
1.0000 [drp] | Freq: Two times a day (BID) | OPHTHALMIC | Status: DC
  Administered 2023-07-29 – 2023-08-02 (×8): 1 [drp] via OPHTHALMIC
  Filled 2023-07-29: qty 5

## 2023-07-29 MED ORDER — FLUTICASONE PROPIONATE 50 MCG/ACT NA SUSP
1.0000 | Freq: Every day | NASAL | Status: DC
  Administered 2023-07-29 – 2023-08-01 (×4): 1 via NASAL
  Filled 2023-07-29: qty 16

## 2023-07-29 MED ORDER — PREDNISONE 10 MG PO TABS
5.0000 mg | ORAL_TABLET | Freq: Every day | ORAL | Status: DC
  Administered 2023-07-30: 5 mg via ORAL
  Filled 2023-07-29: qty 1

## 2023-07-29 MED ORDER — MONTELUKAST SODIUM 10 MG PO TABS
10.0000 mg | ORAL_TABLET | Freq: Every day | ORAL | Status: DC
  Administered 2023-07-29 – 2023-08-01 (×4): 10 mg via ORAL
  Filled 2023-07-29 (×5): qty 1

## 2023-07-29 MED ORDER — ENOXAPARIN SODIUM 60 MG/0.6ML IJ SOSY
60.0000 mg | PREFILLED_SYRINGE | INTRAMUSCULAR | Status: DC
  Filled 2023-07-29: qty 0.6

## 2023-07-29 MED ORDER — ALBUTEROL SULFATE (2.5 MG/3ML) 0.083% IN NEBU: 2.5000 mg | INHALATION_SOLUTION | Freq: Four times a day (QID) | RESPIRATORY_TRACT | Status: DC | PRN

## 2023-07-29 MED ORDER — WARFARIN SODIUM 2.5 MG PO TABS
3.7500 mg | ORAL_TABLET | Freq: Once | ORAL | Status: AC
  Administered 2023-07-29: 3.75 mg via ORAL
  Filled 2023-07-29 (×3): qty 1

## 2023-07-29 MED ORDER — SODIUM CHLORIDE 0.9 % IV SOLN
2.0000 g | INTRAVENOUS | Status: DC
  Administered 2023-07-29: 2 g via INTRAVENOUS
  Filled 2023-07-29 (×2): qty 20

## 2023-07-29 MED ORDER — LAMOTRIGINE 100 MG PO TABS
150.0000 mg | ORAL_TABLET | Freq: Every day | ORAL | Status: DC
  Administered 2023-07-29 – 2023-08-01 (×4): 150 mg via ORAL
  Filled 2023-07-29: qty 2
  Filled 2023-07-29: qty 6
  Filled 2023-07-29: qty 2
  Filled 2023-07-29: qty 6

## 2023-07-29 NOTE — Sepsis Progress Note (Signed)
 Notified bedside nurse of need to draw repeat lactic acid per protocol since the 2nd lactic acid trended up.

## 2023-07-29 NOTE — ED Triage Notes (Addendum)
 Pt via [facility] from home. Pt here for possible sepsis and respiratory distress. Fire Dept reported initial sat of low-80 on RA and pt was place on a NRB at 15L per EMS pt O2 flucuates on the NRB. Also reported that the pt has been weak and vomiting also. Pt is responsive to verbal stimuli on arrival.  EMS reports 103.3 axilarry temp, 96/67 BP, 89% on NRB, 103 CBG,

## 2023-07-29 NOTE — ED Provider Notes (Signed)
 [facility] Provider Note    Event Date/Time   First MD Initiated Contact with Patient 07/29/23 1031     (approximate)   History   Chief Complaint Code Sepsis   HPI  Kenneth Summers is a 78 y.o. male with past medical history of hypertension, hyperlipidemia, stroke with left hemiparesis, rheumatoid arthritis, SLE, IgA deficiency, CKD, PAD, and antiphospholipid syndrome on Coumadin  who presents to the ED for altered mental status.  Per EMS, patient resides at home and family called out today after family found patient having difficulty speaking.  He reportedly has been vomiting over the past 24 hours and when fire department arrived, they found patient in respiratory distress with oxygen saturations in the low 80s on room air.  He was placed on a nonrebreather with improvement, was somnolent but arousable with EMS.  On arrival to the ED, patient endorses difficulty breathing but denies any pain, specifically denies pain in his chest.  Patient was reportedly febrile to 103 with EMS.     Physical Exam   Triage Vital Signs: ED Triage Vitals [07/29/23 1028]  Encounter Vitals Group     BP      Girls Systolic BP Percentile      Girls Diastolic BP Percentile      Boys Systolic BP Percentile      Boys Diastolic BP Percentile      Pulse      Resp      Temp      Temp src      SpO2      Weight 266 lb 4.8 oz (120.8 kg)     Height 6' 5 (1.956 m)     Head Circumference      Peak Flow      Pain Score 0     Pain Loc      Pain Education      Exclude from Growth Chart     Most recent vital signs: Vitals:   07/29/23 1101 07/29/23 1335  BP:  (!) 106/48  Pulse:  84  Resp:  (!) 23  Temp: (!) 103 F (39.4 C)   SpO2:  96%    Constitutional: Somnolent but arousable to voice. Eyes: Conjunctivae are normal. Head: Atraumatic. Nose: No congestion/rhinnorhea. Mouth/Throat: Mucous membranes are very dry. Cardiovascular: Normal rate, regular rhythm. Grossly  normal heart sounds.  2+ radial pulses bilaterally. Respiratory: Tachypneic with increased respiratory effort and accessory muscle use. Lungs with rhonchi and rales throughout. Gastrointestinal: Soft and nontender. No distention. Musculoskeletal: No lower extremity tenderness, 1+ pitting edema to bilateral lower extremities. Neurologic: Slurred speech with left hemiparesis noted.    ED Results / Procedures / Treatments   Labs (all labs ordered are listed, but only abnormal results are displayed) Labs Reviewed  COMPREHENSIVE METABOLIC PANEL WITH GFR - Abnormal; Notable for the following components:      Result Value   Glucose, Bld 102 (*)    BUN 24 (*)    Calcium  8.4 (*)    Albumin 3.2 (*)    AST 47 (*)    Total Bilirubin 1.4 (*)    All other components within normal limits  LACTIC ACID, PLASMA - Abnormal; Notable for the following components:   Lactic Acid, Venous 2.7 (*)    All other components within normal limits  LACTIC ACID, PLASMA - Abnormal; Notable for the following components:   Lactic Acid, Venous 2.2 (*)    All other components within normal limits  CBC WITH  DIFFERENTIAL/PLATELET - Abnormal; Notable for the following components:   WBC 11.6 (*)    Monocytes Absolute 1.3 (*)    All other components within normal limits  PROTIME-INR - Abnormal; Notable for the following components:   Prothrombin Time 25.5 (*)    INR 2.3 (*)    All other components within normal limits  URINALYSIS, W/ REFLEX TO CULTURE (INFECTION SUSPECTED) - Abnormal; Notable for the following components:   Color, Urine YELLOW (*)    APPearance CLEAR (*)    All other components within normal limits  BLOOD GAS, VENOUS - Abnormal; Notable for the following components:   Bicarbonate 29.2 (*)    Acid-Base Excess 4.0 (*)    All other components within normal limits  TROPONIN I (HIGH SENSITIVITY) - Abnormal; Notable for the following components:   Troponin I (High Sensitivity) 97 (*)    All other  components within normal limits  TROPONIN I (HIGH SENSITIVITY) - Abnormal; Notable for the following components:   Troponin I (High Sensitivity) 133 (*)    All other components within normal limits  CULTURE, BLOOD (ROUTINE X 2)  CULTURE, BLOOD (ROUTINE X 2)  MAGNESIUM     EKG  ED ECG REPORT I, Carlin Palin, the attending physician, personally viewed and interpreted this ECG.   Date: 07/29/2023  EKG Time: 10:31  Rate: 89  Rhythm: normal sinus rhythm  Axis: LAD  Intervals:nonspecific intraventricular conduction delay  ST&T Change: None  RADIOLOGY CT head reviewed and interpreted by me with no hemorrhage or midline shift.  PROCEDURES:  Critical Care performed: Yes, see critical care procedure note(s)  .Critical Care  Performed by: Palin Carlin, MD Authorized by: Palin Carlin, MD   Critical care provider statement:    Critical care time (minutes):  30   Critical care time was exclusive of:  Teaching time and separately billable procedures and treating other patients   Critical care was necessary to treat or prevent imminent or life-threatening deterioration of the following conditions:  Respiratory failure and sepsis   Critical care was time spent personally by me on the following activities:  Development of treatment plan with patient or surrogate, discussions with consultants, evaluation of patient's response to treatment, examination of patient, ordering and review of laboratory studies, ordering and review of radiographic studies, ordering and performing treatments and interventions, pulse oximetry, re-evaluation of patient's condition and review of old charts   I assumed direction of critical care for this patient from another provider in my specialty: no     Care discussed with: admitting provider      MEDICATIONS ORDERED IN ED: Medications  lactated ringers  bolus 1,000 mL (0 mLs Intravenous Stopped 07/29/23 1241)    And  lactated ringers  bolus 1,000 mL (0 mLs  Intravenous Stopped 07/29/23 1241)    And  lactated ringers  bolus 1,000 mL (0 mLs Intravenous Stopped 07/29/23 1456)    And  lactated ringers  bolus 1,000 mL (1,000 mLs Intravenous New Bag/Given 07/29/23 1456)  ceFEPIme  (MAXIPIME ) 2 g in sodium chloride  0.9 % 100 mL IVPB (0 g Intravenous Stopped 07/29/23 1132)  metroNIDAZOLE  (FLAGYL ) IVPB 500 mg (0 mg Intravenous Stopped 07/29/23 1259)  vancomycin  (VANCOCIN ) IVPB 1000 mg/200 mL premix (0 mg Intravenous Stopped 07/29/23 1456)  acetaminophen  (TYLENOL ) suppository 650 mg (650 mg Rectal Given 07/29/23 1130)  iohexol (OMNIPAQUE) 300 MG/ML solution 100 mL (100 mLs Intravenous Contrast Given 07/29/23 1217)     IMPRESSION / MDM / ASSESSMENT AND PLAN / ED COURSE  I reviewed the  triage vital signs and the nursing notes.                              79 y.o. male with past medical history of hypertension, hyperlipidemia, stroke with left hemiparesis, rheumatoid arthritis, SLE, IgA deficiency, PAD, CKD, GERD, and antiphospholipid syndrome on Coumadin  who presents to the ED complaining of altered mental status with vomiting over the past 24 hours.  [name] presentation is most consistent with acute presentation with potential threat to life or bodily function.  Differential diagnosis includes, but is not limited to, sepsis, pneumonia, ACS, PE, UTI, anemia, electrolyte abnormality, AKI.  Patient ill-appearing and in respiratory distress on arrival, tachypneic but maintaining oxygen saturations on 15 L nonrebreather.  Patient is not a good candidate for BiPAP given his mental status and recent vomiting, was placed on high flow nasal cannula and is maintaining his oxygen saturations on this.  He is somnolent but arousable to voice, has chronic left-sided weakness but no new focal neurologic deficits noted on exam.  Fever reported by EMS and presentation concerning for sepsis, will resuscitate with IV fluids and start broad-spectrum antibiotics.  I would be concern  for aspiration given his recent vomiting, chest x-ray and urinalysis are pending.  He has a benign abdominal exam, lab results are pending.  Labs with mild leukocytosis and elevation in lactic acid consistent with sepsis.  No significant anemia, electrolyte abnormality, or AKI noted.  LFTs are unremarkable, VBG reassuring without significant hypercapnia.  Urinalysis does not appear concerning for infection, CT of chest/abdomen/pelvis was performed to check for source of sepsis, is consistent with multifocal pneumonia.  CT head shows possible calcified embolus and this finding was reviewed with Dr. Matthews of neurology.  She advises against CT angiography as patient would not be a candidate for endovascular intervention.  Case discussed with hospitalist for admission for further management of sepsis and respiratory failure due to multifocal pneumonia.      FINAL CLINICAL IMPRESSION(S) / ED DIAGNOSES   Final diagnoses:  Sepsis with acute hypoxic respiratory failure without septic shock, due to unspecified organism [facility])  Multifocal pneumonia     Rx / DC Orders   ED Discharge Orders     None        Note:  This document was prepared using Dragon voice recognition software and may include unintentional dictation errors.   Willo Dunnings, MD 07/29/23 [phone]

## 2023-07-29 NOTE — ED Notes (Signed)
 RN Informed that patient has a ready room ICU 13

## 2023-07-29 NOTE — Progress Notes (Signed)
 CODE SEPSIS - PHARMACY COMMUNICATION  **Broad Spectrum Antibiotics should be administered within 1 hour of Sepsis diagnosis**  Time Code Sepsis Called/Page Received: 1030  Antibiotics Ordered: Cefepime , vanc, Flagyl    Time of 1st antibiotic administration: 1100  Additional action taken by pharmacy: N/A  If necessary, Name of Provider/Nurse Contacted: N/A   Estill CHRISTELLA Lutes, PharmD, BCPS Clinical Pharmacist 07/29/2023 10:38 AM

## 2023-07-29 NOTE — Plan of Care (Signed)
  Problem: Education: Goal: Knowledge of General Education information will improve Description: Including pain rating scale, medication(s)/side effects and non-pharmacologic comfort measures Outcome: Progressing   Problem: Health Behavior/Discharge Planning: Goal: Ability to manage health-related needs will improve Outcome: Progressing   Problem: Clinical Measurements: Goal: Ability to maintain clinical measurements within normal limits will improve Outcome: Progressing Goal: Will remain free from infection Outcome: Progressing Goal: Diagnostic test results will improve Outcome: Progressing Goal: Respiratory complications will improve Outcome: Progressing Goal: Cardiovascular complication will be avoided Outcome: Progressing   Problem: Activity: Goal: Risk for activity intolerance will decrease Outcome: Progressing   Problem: Nutrition: Goal: Adequate nutrition will be maintained Outcome: Progressing   Problem: Coping: Goal: Level of anxiety will decrease Outcome: Progressing   Problem: Elimination: Goal: Will not experience complications related to bowel motility Outcome: Progressing Goal: Will not experience complications related to urinary retention Outcome: Progressing   Problem: Pain Managment: Goal: General experience of comfort will improve and/or be controlled Outcome: Progressing   Problem: Safety: Goal: Ability to remain free from injury will improve Outcome: Progressing   Problem: Skin Integrity: Goal: Risk for impaired skin integrity will decrease Outcome: Progressing   Problem: Activity: Goal: Ability to tolerate increased activity will improve Outcome: Progressing   Problem: Clinical Measurements: Goal: Ability to maintain a body temperature in the normal range will improve Outcome: Progressing   Problem: Respiratory: Goal: Ability to maintain adequate ventilation will improve Outcome: Progressing Goal: Ability to maintain a clear airway  will improve Outcome: Progressing   Problem: Fluid Volume: Goal: Hemodynamic stability will improve Outcome: Progressing   Problem: Clinical Measurements: Goal: Diagnostic test results will improve Outcome: Progressing Goal: Signs and symptoms of infection will decrease Outcome: Progressing   Problem: Respiratory: Goal: Ability to maintain adequate ventilation will improve Outcome: Progressing

## 2023-07-29 NOTE — Consult Note (Signed)
 Pharmacy Consult Note - Anticoagulation  Pharmacy Consult for warfarin Indication: APS  PATIENT MEASUREMENTS: Height: 6' 5 (195.6 cm) Weight: 115.5 kg (254 lb 10.1 oz) IBW/kg (Calculated) : 89.1 HEPARIN DW (KG): 114.2  VITAL SIGNS: Temp: 97.5 F (36.4 C) (06/22 1614) Temp Source: Axillary (06/22 1614) BP: 105/63 (06/22 1608) Pulse Rate: 72 (06/22 1614)  Recent Labs    07/29/23 1034 07/29/23 1335 07/29/23 1643  HGB 14.6  --   --   HCT 44.7  --   --   PLT 171  --   --   LABPROT 25.5*  --   --   INR 2.3*  --   --   CREATININE 1.14  --   --   TROPONINIHS 97*   < > 162*   < > = values in this interval not displayed.    Estimated Creatinine Clearance: 75.3 mL/min (by C-G formula based on SCr of 1.14 mg/dL).  PAST MEDICAL HISTORY: Past Medical History:  Diagnosis Date   Actinic keratosis    Alcohol abuse    APS (antiphospholipid syndrome) (HCC)    Basal cell carcinoma    BPH (benign prostatic hyperplasia)    CAD (coronary artery disease)    Cellulitis    Cervical spinal stenosis    Chronic osteomyelitis (HCC)    CKD (chronic kidney disease)    CKD (chronic kidney disease)    Clostridium difficile diarrhea    Depression    Diplopia    Fatigue    GERD (gastroesophageal reflux disease)    Hyperlipemia    Hypertension    Hypovitaminosis D    IBS (irritable bowel syndrome)    IgA deficiency (HCC)    Insomnia    Left lumbosacral radiculopathy    Moderate obstructive sleep apnea    Osteomyelitis of foot (HCC)    PAD (peripheral artery disease) (HCC)    Pruritus    RA (rheumatoid arthritis) (HCC)    Radiculopathy    Restless legs syndrome    RLS (restless legs syndrome)    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 male with PMH including antiphospholipid syndrome on warfarin, HTN, stroke, CAD  is presenting with sepsis secondary to pneumonia. Patient is on chronic anticoagulation using warfarin per chart review. Last dose prior to admission was yesterday, 6/21. INR is currently therapeutic. Pharmacy has been consulted to manage warfarin while patient is admitted.  Pertinent medications: Warfarin 3.75mg  PO daily Patient has 2.5mg  tablets at home  New drug-drug interactions: Azithromycin >> increased risk of bleeding  Goal(s) of therapy: INR 2 - 3 Monitor platelets by anticoagulation protocol: Yes   Baseline anticoagulation labs: Recent Labs    07/29/23 1034  INR 2.3*  HGB 14.6  PLT 171    Date INR Warfarin Dose 6/22 2.3 3.75mg    PLAN: Administer warfarin 3.75mg  PO x 1 today in accordance with home regimen Daily INR CBC at least every 3 days while on warfarin  Will M. Lenon, PharmD Clinical Pharmacist 07/29/2023 5:53 PM

## 2023-07-29 NOTE — H&P (Signed)
 History and Physical    Patient: Kenneth Summers FMW:[id number] DOB: [birth year] DOA: 07/29/2023 DOS: the patient was seen and examined on 07/29/2023 PCP: Steva Clotilda DEL, NP  Patient coming from: Home  Chief Complaint:  Chief Complaint  Patient presents with   Code Sepsis   HPI: Kenneth Summers is a 78 y.o. male with medical history significant of actinic keratosis, alcohol abuse, antiphospholipid syndrome, basal cell carcinoma, BPH, CAD, cellulitis, chronic osteomyelitis, cervical spinal stenosis, CKD, C. difficile diarrhea, depression, diplopia, fatigue, GERD, hyperlipidemia, hypertension, vitamin D deficiency, IBS, IgA deficiency, insomnia, left lumbosacral radiculopathy, moderate obstructive sleep apnea, osteomyelitis of the foot, PAD, rheumatoid arthritis, restless leg syndrome, systemic lupus erythematosus, history of CVA who was brought from home due to possible sepsis and respiratory distress. EMS described that initially he was on 80% on room air and subsequently was placed on NRB at 15 L/min with his O2 sat increasing to normal levels. He has also been having generalized weakness and had several episodes of emesis.  He is still confused, but able to answer some simple questions.  He is not having headache, chest or abdominal pain at this time.  History is provided by the patient's daughter.  Lab work: Urinalysis was yellow in color and clear.  CBC showed white count 11.6 with 54% neutrophils, 35% lymphocytes.  Hemoglobin 14.6 g/dL platelets 828.  PT was 25.5 and INR was 2.3.  Venous blood gas with normal pH, normal pCO2 and PO2.  Bicarbonate was 29.2 and acid-base axis 4.0 mmol/L.  Troponin was 97 then 133 ng/L.  Lactic acid was 2.2 and then 2.7 mmol/L.  CMP showed normal electrolytes after calcium  correction.  Glucose 102, BUN 24, creatinine 1.14 and total bilirubin 1.4 mg/dL.  Albumin was 3.2 g/dL and AST 47 units/L.  Total protein, ALT and alkaline phosphatase were normal.  Imaging:  Portable 1 view chest radiograph showing left midlung infiltrate or volume loss. Small bilateral pleural effusions. CT head without contrast with no acute intracranial hemorrhage or evidence of an acute infarct.  There is calcific focus in the region of the left middle cerebral artery M1/M2 junction, new from the prior CT head.  This may reflect calcified atherosclerotic plaque or age-indeterminate calcified embolus.  CT angiography may be obtained for further evaluation, as clinically warranted.  Background parenchymal atrophy, chronic small vessel ischemic disease and chronic infarcts. CT abdomen/pelvis with contrast showing multifocal pneumonia.  No acute intra-abdominal or intrapelvic abnormality. Punctate bilateral nonobstructing renal stones. Colonic diverticulosis without acute diverticulitis.  Aortic atherosclerosis. Coronary artery calcifications.    ED course: Initial vital signs were temperature 103 F, pulse 69, respirations 31, BP 105/57 mmHg and O2 sat 97% on NRB mask at 15 L/min.  The patient received acetaminophen  650 mg p.o. x 1, cefepime  2 g IVPB, LR 4000 mL bolus, metronidazole  500 mg IVPB and vancomycin  1000 mg IVPB.   Review of Systems: As mentioned in the history of present illness. All other systems reviewed and are negative. Past Medical History:  Diagnosis Date   Actinic keratosis    Alcohol abuse    APS (antiphospholipid syndrome) (HCC)    Basal cell carcinoma    BPH (benign prostatic hyperplasia)    CAD (coronary artery disease)    Cellulitis    Cervical spinal stenosis    Chronic osteomyelitis (HCC)    CKD (chronic kidney disease)    CKD (chronic kidney disease)    Clostridium difficile diarrhea    Depression    Diplopia  Fatigue    GERD (gastroesophageal reflux disease)    Hyperlipemia    Hypertension    Hypovitaminosis D    IBS (irritable bowel syndrome)    IgA deficiency (HCC)    Insomnia    Left lumbosacral radiculopathy    Moderate obstructive sleep  apnea    Osteomyelitis of foot (HCC)    PAD (peripheral artery disease) (HCC)    Pruritus    RA (rheumatoid arthritis) (HCC)    Radiculopathy    Restless legs syndrome    RLS (restless legs syndrome)    SLE (systemic lupus erythematosus related syndrome) (HCC)    Squamous cell carcinoma of skin 01/27/2019   right mid lower ear helix   Stroke (HCC)    Toxic maculopathy of both eyes    Past Surgical History:  Procedure Laterality Date   CARDIAC CATHETERIZATION     CAROTID ENDARTERECTOMY     CERVICAL LAMINECTOMY     CORONARY ANGIOPLASTY     FRACTURE SURGERY     fusion C5-6-7     HERNIA REPAIR     KNEE ARTHROSCOPY     TONSILLECTOMY     Social History:  reports that he has quit smoking. His smoking use included cigarettes. He has a 20 pack-year smoking history. He has never used smokeless tobacco. He reports that he does not currently use alcohol. He reports that he does not use drugs.  Allergies  Allergen Reactions   Hydroxychloroquine Other (See Comments)    Other reaction(s): Other (See Comments)  Causing blindness Visual problems     Family History  Problem Relation Age of Onset   COPD Mother    Osteoporosis Mother    Stroke Father    Osteoporosis Sister    Lupus Niece     Prior to Admission medications   Medication Sig Start Date End Date Taking? Authorizing Provider  aspirin  EC 81 MG tablet Take by mouth daily.    [provider]  atenolol  (TENORMIN ) 25 MG tablet Take 0.5 tablets (12.5 mg total) by mouth daily. Hold this medication until you see your PCP 12/29/20   Trudy Anthony HERO, MD  atorvastatin  (LIPITOR) 80 MG tablet Take 80 mg by mouth Nightly.    [provider]  cholecalciferol (VITAMIN D3) 25 MCG (1000 UNIT) tablet Take 2,000 Units by mouth daily.    [provider]  Cyanocobalamin (VITAMIN B 12 PO) Take by mouth.    [provider]  FLUoxetine  (PROZAC ) 40 MG capsule Take 40 mg by mouth daily.    [provider]  fluticasone (FLONASE) 50 MCG/ACT nasal spray Place into both nostrils daily.    [provider]  gabapentin  (NEURONTIN ) 400 MG capsule Take 1,300 mg by mouth at bedtime.    [provider]  hydrocortisone  2.5 % cream Apply to affected areas face 1-2 times daily as directed. 05/20/19   Jackquline Sawyer, MD  ketoconazole  (NIZORAL ) 2 % cream Apply to affected areas face 1-2 times daily as directed. 05/20/19   Jackquline Sawyer, MD  lamoTRIgine  (LAMICTAL ) 25 MG tablet Take 75 mg by mouth 2 (two) times daily.    [provider]  losartan  (COZAAR ) 25 MG tablet Take 25 mg by mouth daily.    [provider]  montelukast  (SINGULAIR ) 10 MG tablet Take 10 mg by mouth daily.    [provider]  olopatadine  (PATANOL) 0.1 % ophthalmic solution Place 1 drop into both eyes 2 (two) times daily.    [provider]  predniSONE  (DELTASONE ) 5 MG tablet Take 5 mg by mouth daily with breakfast.    [provider]  QUEtiapine  (SEROQUEL ) 25 MG tablet 25 mg at bedtime. 10/15/18   [provider]  rOPINIRole  (REQUIP ) 2 MG tablet Take 2 mg by mouth at bedtime.    [provider]  warfarin (COUMADIN ) 2.5 MG tablet Take 2.5 mg by mouth daily. Tues, Wednesday Thursday, Saturday, Sunday 08/29/18   [provider]  warfarin (COUMADIN ) 5 MG tablet Take 5 mg by mouth 2 (two) times a week. Monday and Friday.    [provider]    Physical Exam: Vitals:   07/29/23 1030 07/29/23 1031 07/29/23 1101 07/29/23 1335  BP: (!) 105/57   (!) 106/48  Pulse:  69  84  Resp:  (!) 31  (!) 23  Temp:   (!) 103 F (39.4 C)   TempSrc:   Rectal   SpO2:  97%  96%  Weight:      Height:       Physical Exam Vitals and nursing note reviewed.  Constitutional:      Appearance: Normal appearance. He is ill-appearing.  HENT:     Head: Normocephalic.     Nose: No rhinorrhea.     Mouth/Throat:     Mouth: Mucous membranes are moist.   Eyes:      General: No scleral icterus.    Pupils: Pupils are equal, round, and reactive to light.   Neck:     Vascular: No JVD.   Cardiovascular:     Rate and Rhythm: Normal rate and regular rhythm.     Heart sounds: S1 normal and S2 normal.  Pulmonary:     Breath sounds: No wheezing, rhonchi or rales.  Abdominal:     Palpations: Abdomen is soft.     Tenderness: There is no abdominal tenderness. There is no right CVA tenderness or left CVA tenderness.   Musculoskeletal:     Cervical back: Neck supple.     Right lower leg: No edema.     Left lower leg: No edema.   Skin:    General: Skin is warm and dry.   Neurological:     Mental Status: He is alert. He is disoriented.     Motor: Weakness present.     Comments: Left-sided 3/5 hemiparesis.  Psychiatric:        Mood and Affect: Mood normal.    Data Reviewed:  Results are pending, will review when available.  EKG: Vent. rate 92 BPM PR interval 236 ms QRS duration 143 ms QT/QTcB 441/546 ms P-R-T axes -60 -68 97 Sinus or ectopic atrial rhythm Atrial premature complex Sinus pause Prolonged PR interval Left bundle branch block  Assessment and Plan: Principal Problem:   Acute respiratory failure with hypoxia (HCC)  In the setting of:   Sepsis due to pneumonia (HCC) Admit to SDU/inpatient. Continue supplemental oxygen. Scheduled and as needed bronchodilators. Continue ceftriaxone 1 g IVPB daily. Continue azithromycin 500 mg IVPB daily. Check strep pneumoniae urinary antigen. Check sputum Gram stain, culture and sensitivity. Follow-up blood culture and sensitivity. Follow-up CBC and chemistry in the morning.  Active Problems:   Elevated troponin In the patient with history of:   CAD (coronary artery disease) Likely due to demand ischemia. Will obtain echocardiogram.    Benign prostatic hyperplasia Follow-up with urology as an outpatient.    Chronic kidney disease, stage 3 unspecified (HCC) Monitor renal function  electrolytes.    Depression Continue fluoxetine  40 mg  p.o. daily.    Hyperlipidemia Continue atorvastatin  80 mg p.o. daily.    Essential hypertension Hold antihypertensives for now.    History of stroke Supportive care.    Long term (current) use of anticoagulants Due to history of:   SLE (systemic lupus erythematosus) (HCC)   APS (antiphospholipid syndrome) (HCC) Continue warfarin per pharmacy. Follow-up with rheumatology as an outpatient as needed.     Advance Care Planning:   Code Status: Limited: Do not attempt resuscitation (DNR) -DNR-LIMITED -Do Not Intubate/DNI    Consults:   Family Communication: His daughter was at bedside.  Severity of Illness: The appropriate patient status for this patient is INPATIENT. Inpatient status is judged to be reasonable and necessary in order to provide the required intensity of service to ensure the patient's safety. The patient's presenting symptoms, physical exam findings, and initial radiographic and laboratory data in the context of their chronic comorbidities is felt to place them at high risk for further clinical deterioration. Furthermore, it is not anticipated that the patient will be medically stable for discharge from the hospital within 2 midnights of admission.   * I certify that at the point of admission it is my clinical judgment that the patient will require inpatient hospital care spanning beyond 2 midnights from the point of admission due to high intensity of service, high risk for further deterioration and high frequency of surveillance required.*  Author: Alm Dorn Castor, MD 07/29/2023 2:36 PM  For on call review [email or web address].   This document was prepared using Dragon voice recognition software and may contain some unintended transcription errors.

## 2023-07-29 NOTE — Progress Notes (Signed)
 PHARMACIST - PHYSICIAN COMMUNICATION  CONCERNING:  Enoxaparin (Lovenox) for DVT Prophylaxis    RECOMMENDATION: Patient was prescribed enoxaprin 40mg  q24 hours for VTE prophylaxis.   Filed Weights   07/29/23 1028  Weight: 120.8 kg (266 lb 4.8 oz)    Body mass index is 31.58 kg/m.  Estimated Creatinine Clearance: 76.9 mL/min (by C-G formula based on SCr of 1.14 mg/dL).   Based on [facility] policy patient is candidate for enoxaparin 0.5mg /kg TBW SQ every 24 hours based on BMI being >30.   DESCRIPTION: Pharmacy has adjusted enoxaparin dose per [facility] policy.  Patient is now receiving enoxaparin 60 mg every 24 hours    Kayla JULIANNA Blew, PharmD Clinical Pharmacist  07/29/2023 3:54 PM

## 2023-07-29 NOTE — Sepsis Progress Note (Signed)
 Sepsis protocol is being followed by [facility].

## 2023-07-30 ENCOUNTER — Inpatient Hospital Stay

## 2023-07-30 ENCOUNTER — Inpatient Hospital Stay (HOSPITAL_COMMUNITY)
Admit: 2023-07-30 | Discharge: 2023-07-30 | Disposition: A | Discharge status: Home / Self Care | Attending: Internal Medicine | Admitting: Internal Medicine

## 2023-07-30 DIAGNOSIS — J189 Pneumonia, unspecified organism: Secondary | ICD-10-CM | POA: Diagnosis not present

## 2023-07-30 DIAGNOSIS — R7989 Other specified abnormal findings of blood chemistry: Secondary | ICD-10-CM | POA: Diagnosis not present

## 2023-07-30 DIAGNOSIS — A419 Sepsis, unspecified organism: Secondary | ICD-10-CM | POA: Diagnosis not present

## 2023-07-30 DIAGNOSIS — Z7189 Other specified counseling: Secondary | ICD-10-CM | POA: Diagnosis not present

## 2023-07-30 DIAGNOSIS — R9431 Abnormal electrocardiogram [ECG] [EKG]: Secondary | ICD-10-CM

## 2023-07-30 LAB — CBC WITH DIFFERENTIAL/PLATELET
Abs Immature Granulocytes: 0.03 10*3/uL (ref 0.00–0.07)
Basophils Absolute: 0 10*3/uL (ref 0.0–0.1)
Basophils Relative: 0 %
Eosinophils Absolute: 0 10*3/uL (ref 0.0–0.5)
Eosinophils Relative: 0 %
HCT: 39.5 % (ref 39.0–52.0)
Hemoglobin: 12.9 g/dL — ABNORMAL LOW (ref 13.0–17.0)
Immature Granulocytes: 0 %
Lymphocytes Relative: 44 %
Lymphs Abs: 4.6 10*3/uL — ABNORMAL HIGH (ref 0.7–4.0)
MCH: 31.8 pg (ref 26.0–34.0)
MCHC: 32.7 g/dL (ref 30.0–36.0)
MCV: 97.3 fL (ref 80.0–100.0)
Monocytes Absolute: 0.9 10*3/uL (ref 0.1–1.0)
Monocytes Relative: 9 %
Neutro Abs: 5 10*3/uL (ref 1.7–7.7)
Neutrophils Relative %: 47 %
Platelets: 137 10*3/uL — ABNORMAL LOW (ref 150–400)
RBC: 4.06 MIL/uL — ABNORMAL LOW (ref 4.22–5.81)
RDW: 14.2 % (ref 11.5–15.5)
WBC: 10.6 10*3/uL — ABNORMAL HIGH (ref 4.0–10.5)
nRBC: 0.2 % (ref 0.0–0.2)

## 2023-07-30 LAB — RESP PANEL BY RT-PCR (RSV, FLU A&B, COVID)  RVPGX2
Influenza A by PCR: NEGATIVE
Influenza B by PCR: NEGATIVE
Resp Syncytial Virus by PCR: NEGATIVE
SARS Coronavirus 2 by RT PCR: POSITIVE — AB

## 2023-07-30 LAB — RESPIRATORY PANEL BY PCR

## 2023-07-30 LAB — ECHOCARDIOGRAM COMPLETE
AR max vel: 1.87 cm2
AV Area VTI: 2.4 cm2
AV Area mean vel: 1.77 cm2
AV Mean grad: 6.5 mmHg
AV Peak grad: 10.7 mmHg
Ao pk vel: 1.64 m/s
Area-P 1/2: 2.37 cm2
Height: 77 in
MV VTI: 1.97 cm2
S' Lateral: 3.1 cm
Weight: 4074.1 [oz_av]

## 2023-07-30 LAB — COMPREHENSIVE METABOLIC PANEL WITH GFR
ALT: 26 U/L (ref 0–44)
AST: 45 U/L — ABNORMAL HIGH (ref 15–41)
Albumin: 2.6 g/dL — ABNORMAL LOW (ref 3.5–5.0)
Alkaline Phosphatase: 57 U/L (ref 38–126)
Anion gap: 5 (ref 5–15)
BUN: 27 mg/dL — ABNORMAL HIGH (ref 8–23)
CO2: 27 mmol/L (ref 22–32)
Calcium: 8 mg/dL — ABNORMAL LOW (ref 8.9–10.3)
Chloride: 102 mmol/L (ref 98–111)
Creatinine, Ser: 1.2 mg/dL (ref 0.61–1.24)
GFR, Estimated: >60 mL/min (ref >60)
Glucose, Bld: 74 mg/dL (ref 70–99)
Potassium: 4.5 mmol/L (ref 3.5–5.1)
Sodium: 134 mmol/L — ABNORMAL LOW (ref 135–145)
Total Bilirubin: 1.3 mg/dL — ABNORMAL HIGH (ref 0.0–1.2)
Total Protein: 5.8 g/dL — ABNORMAL LOW (ref 6.5–8.1)

## 2023-07-30 LAB — TROPONIN I (HIGH SENSITIVITY): Troponin I (High Sensitivity): 156 ng/L (ref <18)

## 2023-07-30 LAB — PROTIME-INR
INR: 2.2 — ABNORMAL HIGH (ref 0.8–1.2)
Prothrombin Time: 24.5 s — ABNORMAL HIGH (ref 11.4–15.2)

## 2023-07-30 LAB — PROCALCITONIN: Procalcitonin: 0.42 ng/mL

## 2023-07-30 LAB — LACTIC ACID, PLASMA
Lactic Acid, Venous: 1.6 mmol/L (ref 0.5–1.9)
Lactic Acid, Venous: 1.5 mmol/L (ref 0.5–1.9)

## 2023-07-30 MED ORDER — DEXAMETHASONE 4 MG PO TABS
10.0000 mg | ORAL_TABLET | Freq: Every day | ORAL | Status: DC
  Administered 2023-07-30 – 2023-08-01 (×3): 10 mg via ORAL
  Filled 2023-07-30: qty 1
  Filled 2023-07-30: qty 3
  Filled 2023-07-30: qty 1
  Filled 2023-07-30: qty 3

## 2023-07-30 MED ORDER — LORAZEPAM 2 MG/ML IJ SOLN
1.0000 mg | Freq: Once | INTRAMUSCULAR | Status: AC | PRN
  Administered 2023-07-30: 1 mg via INTRAVENOUS
  Filled 2023-07-30 (×2): qty 1

## 2023-07-30 MED ORDER — ORAL CARE MOUTH RINSE
15.0000 mL | OROMUCOSAL | Status: DC | PRN
Start: 2023-07-30 — End: 2023-08-02

## 2023-07-30 MED ORDER — SODIUM CHLORIDE 0.9 % IV SOLN
3.0000 g | Freq: Four times a day (QID) | INTRAVENOUS | Status: DC
  Administered 2023-07-30 – 2023-08-01 (×9): 3 g via INTRAVENOUS
  Filled 2023-07-30 (×11): qty 8

## 2023-07-30 MED ORDER — METRONIDAZOLE 500 MG/100ML IV SOLN: 500.0000 mg | Freq: Two times a day (BID) | INTRAVENOUS | Status: DC

## 2023-07-30 MED ORDER — WARFARIN SODIUM 2.5 MG PO TABS
3.7500 mg | ORAL_TABLET | Freq: Once | ORAL | Status: AC
  Administered 2023-07-30: 3.75 mg via ORAL
  Filled 2023-07-30: qty 1

## 2023-07-30 MED ORDER — SODIUM CHLORIDE 0.9 % IV SOLN: INTRAVENOUS | Status: AC | PRN

## 2023-07-30 NOTE — Plan of Care (Signed)
  Problem: Education: Goal: Knowledge of General Education information will improve Description: Including pain rating scale, medication(s)/side effects and non-pharmacologic comfort measures Outcome: Progressing   Problem: Clinical Measurements: Goal: Respiratory complications will improve Outcome: Progressing Goal: Cardiovascular complication will be avoided Outcome: Progressing   Problem: Clinical Measurements: Goal: Diagnostic test results will improve Outcome: Progressing

## 2023-07-30 NOTE — Progress Notes (Signed)
   07/30/23 1115  Spiritual Encounters  Type of Visit Initial  Care provided to: Pt and family (Daughter at bedside)  Referral source Patient request  Reason for visit Religious ritual  OnCall Visit Yes  Spiritual Framework  Presenting Themes Values and beliefs;Coping tools;Other (comment) (for Patient)  Patient Stress Factors Other (Comment) (Pt requested prayer for his wife who is home recuperating from breaking both her legs)  Family Stress Factors  (Daughter has two parents with medical issues)  Interventions  Spiritual Care Interventions Made Established relationship of care and support;Compassionate presence;Reflective listening;Explored values/beliefs/practices/strengths;Prayer  Intervention Outcomes  Outcomes Connection to spiritual care;Awareness around self/spiritual resourses;Connection to values and goals of care;Awareness of support

## 2023-07-30 NOTE — Plan of Care (Incomplete)
 The patient remains on AR-ICU as of time of writing. The patient is awake and alert, oriented to person and place only. Episode of emesis overnight followed by an increase in WOB, dyspnea at rest, increased O2 requirement, and general cyanosis; Dr. Cleatus notified via secure chat. The patient remains in SR per telemetry. External urinary containment device. Large incontinent BM overnight. PIV access only. The patient remains on airborne and contact isolation.   Problem: Education: Goal: Knowledge of General Education information will improve Description: Including pain rating scale, medication(s)/side effects and non-pharmacologic comfort measures Outcome: Progressing   Problem: Health Behavior/Discharge Planning: Goal: Ability to manage health-related needs will improve Outcome: Progressing   Problem: Clinical Measurements: Goal: Ability to maintain clinical measurements within normal limits will improve Outcome: Progressing Goal: Will remain free from infection Outcome: Progressing Goal: Diagnostic test results will improve Outcome: Progressing Goal: Respiratory complications will improve Outcome: Progressing Goal: Cardiovascular complication will be avoided Outcome: Progressing   Problem: Activity: Goal: Risk for activity intolerance will decrease Outcome: Progressing   Problem: Nutrition: Goal: Adequate nutrition will be maintained Outcome: Progressing   Problem: Coping: Goal: Level of anxiety will decrease Outcome: Progressing   Problem: Elimination: Goal: Will not experience complications related to bowel motility Outcome: Progressing Goal: Will not experience complications related to urinary retention Outcome: Progressing   Problem: Pain Managment: Goal: General experience of comfort will improve and/or be controlled Outcome: Progressing   Problem: Safety: Goal: Ability to remain free from injury will improve Outcome: Progressing   Problem: Skin  Integrity: Goal: Risk for impaired skin integrity will decrease Outcome: Progressing   Problem: Activity: Goal: Ability to tolerate increased activity will improve Outcome: Progressing   Problem: Clinical Measurements: Goal: Ability to maintain a body temperature in the normal range will improve Outcome: Progressing   Problem: Respiratory: Goal: Ability to maintain adequate ventilation will improve Outcome: Progressing Goal: Ability to maintain a clear airway will improve Outcome: Progressing   Problem: Fluid Volume: Goal: Hemodynamic stability will improve Outcome: Progressing   Problem: Clinical Measurements: Goal: Diagnostic test results will improve Outcome: Progressing Goal: Signs and symptoms of infection will decrease Outcome: Progressing   Problem: Respiratory: Goal: Ability to maintain adequate ventilation will improve Outcome: Progressing   Problem: Education: Goal: Knowledge of risk factors and measures for prevention of condition will improve Outcome: Progressing   Problem: Coping: Goal: Psychosocial and spiritual needs will be supported Outcome: Progressing   Problem: Respiratory: Goal: Will maintain a patent airway Outcome: Progressing Goal: Complications related to the disease process, condition or treatment will be avoided or minimized Outcome: Progressing

## 2023-07-30 NOTE — Evaluation (Signed)
 Clinical/Bedside Swallow Evaluation Patient Details  Name: Kenneth Summers MRN: [id number] Date of Birth: Jan 15, 1946  Today's Date: 07/30/2023 Time: SLP Start Time (ACUTE ONLY): 1100 SLP Stop Time (ACUTE ONLY): 1130 SLP Time Calculation (min) (ACUTE ONLY): 30 min  Past Medical History:  Past Medical History:  Diagnosis Date   Actinic keratosis    Alcohol abuse    APS (antiphospholipid syndrome) (HCC)    Basal cell carcinoma    BPH (benign prostatic hyperplasia)    CAD (coronary artery disease)    Cellulitis    Cervical spinal stenosis    Chronic osteomyelitis (HCC)    CKD (chronic kidney disease)    CKD (chronic kidney disease)    Clostridium difficile diarrhea    Depression    Diplopia    Fatigue    GERD (gastroesophageal reflux disease)    Hyperlipemia    Hypertension    Hypovitaminosis D    IBS (irritable bowel syndrome)    IgA deficiency (HCC)    Insomnia    Left lumbosacral radiculopathy    Moderate obstructive sleep apnea    Osteomyelitis of foot (HCC)    PAD (peripheral artery disease) (HCC)    Pruritus    RA (rheumatoid arthritis) (HCC)    Radiculopathy    Restless legs syndrome    RLS (restless legs syndrome)    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 male with medical history significant of actinic keratosis, alcohol abuse, antiphospholipid syndrome, basal cell carcinoma, BPH, CAD, cellulitis, chronic osteomyelitis, cervical spinal stenosis, CKD, C. difficile diarrhea,  depression, diplopia, fatigue, GERD, hyperlipidemia, hypertension, vitamin D deficiency, IBS, IgA deficiency, insomnia, left lumbosacral radiculopathy, moderate obstructive sleep apnea, osteomyelitis of the foot, PAD, rheumatoid arthritis, restless leg syndrome, systemic lupus erythematosus, history of CVA who was brought from home due to possible sepsis and respiratory distress. EMS described that initially he was on 80% on room air and subsequently was placed on NRB at 15 L/min with his O2 sat increasing to normal levels. He has also been having generalized weakness and had several episodes of emesis.  He is still confused, but able to answer some simple questions.  He is not having headache, chest or abdominal pain at this time.  History is provided by the patient's daughter. CT Head No acute intracranial hemorrhage or evidence of an acute infarct.  2. Calcific focus in the region of the left middle cerebral artery  M1/M2 junction, new from the prior head CT of 12/28/2020. This may  reflect calcified atherosclerotic plaque or an age-indeterminate  calcified embolus. CT angiography may be obtained for further  evaluation, as clinically warranted.  3. Background parenchymal atrophy, chronic small vessel ischemic  disease and chronic infarcts, as described. CXR  Left mid lung infiltrate or volume loss.  2. Small bilateral pleural effusions.    Assessment / Plan / Recommendation  Clinical Impression  Pt seen for bedside swallow assessment in the setting of concern for aspiration PNA and dysphagia. Pt with history of dysphagia per daughter following CVA in 2019- per care everywhere pt discharged on a regular solids and thin liquids diet. Daughter reports baseline of regular solids/thins. Chart review revealing concerns for esophageal impact on current presentation- DG esophagus 09/20/2020 revealing, moderate-severe esophageal dysmotility. Pt with PMHX of GERD.  Pt on room air upon entrance, with O2 sats in the  high 80s, pt subsequently placed on 2L with improvement to 90s. O2 saturations fluctuating during assessment not timed with PO trials. Similarly, pt with congested, persistent cough at baseline.   Pt seen with trials of thin liquids (via straw) and regular solids. No immediate s/sx of aspiration noted (delayed baseline congested cough persisting t/o trials and after completion). Oral phase mildly prolonged- with reduced attention to task of eating and need for verbal cues of redirection/completion of task.   Increased risk of aspiration (specifically reflux aspiration) noted based on age, esophageal dysmotility, hx of CVA, baseline limited mobility, and acute respiratory/pulmonary status. Therefore recommend esophageal/aspiration precautions: slow rate of intake, smaller more frequent meals, follow bites of food with liquids, remain upright for 30-60 minutes after eating, do not eat/drink 2-3 hours before bedtime except water, elevate head of bed at least 30 degrees. Continue dys 2 and thin liquids. SLP will monitor for continued safety with current diet and continue to follow pending decision for GOC. MD and RN aware of recommendations.   SLP Visit Diagnosis: Dysphagia, unspecified (R13.10) (?hx of dsyphagia, esophageal dysphagia)    Aspiration Risk  Moderate aspiration risk    Diet Recommendation   Thin;Stage 2 baby food  Medication Administration: Whole meds with puree (vs thins)    Other  Recommendations Recommended Consults: Consider GI evaluation;Consider esophageal assessment Oral Care Recommendations: Oral care BID;Staff/trained caregiver to provide oral care     Assistance Recommended at Discharge    Functional Status Assessment Patient has had a recent decline in their functional status and demonstrates the ability to make significant improvements in function in a reasonable and predictable amount of time.  Frequency and Duration min 2x/week  2 weeks       Prognosis Prognosis for  improved oropharyngeal function: Fair Barriers to Reach Goals:  (?baseline, esophageal considerations)      Swallow Study   General Date of Onset: 07/30/23 HPI: Per MD Progress Note, Kenneth Summers is a 78 y.o. male with medical history significant of actinic keratosis, alcohol abuse, antiphospholipid syndrome, basal cell carcinoma, BPH, CAD, cellulitis, chronic osteomyelitis, cervical spinal stenosis, CKD, C. difficile diarrhea, depression, diplopia, fatigue, GERD, hyperlipidemia, hypertension, vitamin D deficiency, IBS, IgA deficiency, insomnia, left lumbosacral radiculopathy, moderate obstructive sleep apnea, osteomyelitis of the foot, PAD, rheumatoid arthritis, restless leg syndrome, systemic lupus erythematosus, history of CVA who was brought from home due to possible sepsis and respiratory distress. EMS described that initially he was on 80% on room air and subsequently was placed on NRB at 15 L/min with his O2 sat increasing to normal levels. He has also been having generalized weakness and had several episodes of emesis.  He is still confused, but able to answer some simple questions.  He is not having headache, chest or abdominal pain at this time.  History is provided by the patient's daughter. CT Head No acute intracranial hemorrhage or evidence of an acute infarct.  2. Calcific focus in the region of the left middle cerebral artery  M1/M2  junction, new from the prior head CT of 12/28/2020. This may  reflect calcified atherosclerotic plaque or an age-indeterminate  calcified embolus. CT angiography may be obtained for further  evaluation, as clinically warranted.  3. Background parenchymal atrophy, chronic small vessel ischemic  disease and chronic infarcts, as described. CXR  Left mid lung infiltrate or volume loss.  2. Small bilateral pleural effusions. Type of Study: Bedside Swallow Evaluation Previous Swallow Assessment: Per pt's daughter hx of dysphagia following CVA in 2019- no MBSS  noted in chart review. discharged on regular diet per [facility] notes Diet Prior to this Study: Dysphagia 2 (finely chopped);Thin liquids (Level 0) Temperature Spikes Noted: No (WBC 10.6) Respiratory Status: Nasal cannula (2L) History of Recent Intubation: No Behavior/Cognition: Alert;Requires cueing;Distractible Oral Cavity Assessment: Within Functional Limits Oral Care Completed by SLP: Yes Oral Cavity - Dentition: Missing dentition Vision: Functional for self-feeding (some inattention to task noted, pt dropping trial without noting/seeing) Self-Feeding Abilities: Needs assist;Needs set up Patient Positioning: Upright in bed Baseline Vocal Quality: Normal Volitional Cough: Congested Volitional Swallow: Unable to elicit    Oral/Motor/Sensory Function Overall Oral Motor/Sensory Function: Within functional limits (hx of facial weakness with CVA)   Ice Chips Ice chips: Not tested   Thin Liquid Thin Liquid: Within functional limits Presentation: Straw;Self Fed    Nectar Thick Nectar Thick Liquid: Not tested   Honey Thick Honey Thick Liquid: Not tested   Puree Puree: Not tested   Solid     Solid: Impaired Presentation: Self Fed Oral Phase Impairments: Impaired mastication;Poor awareness of bolus Oral Phase Functional Implications: Prolonged oral transit;Impaired mastication Pharyngeal Phase Impairments: Cough - Delayed (similar to baseline congestion)     [place] Alter Moss Clapp, MS, CCC-SLP Speech Language Pathologist Rehab Services; [facility] [place] [phone] (ascom)   [place] J Clapp 07/30/2023,1:29 PM

## 2023-07-30 NOTE — Evaluation (Signed)
 Occupational Therapy Evaluation Patient Details Name: Kenneth Summers MRN: [id number] DOB: Feb 25, 1945 Today's Date: 07/30/2023   History of Present Illness   Pt is a 78 y.o. male who was brought from home due to respiratory distress, generalized weakness and several episodes of emesis. Admitted for management of acute respiratory failure with hypoxia 2/2 sepsis d/t PNA. PMH of actinic keratosis, alcohol abuse, antiphospholipid syndrome, basal cell carcinoma, BPH, CAD, cellulitis, chronic osteomyelitis, cervical spinal stenosis, CKD, C. difficile diarrhea, depression, diplopia, fatigue, GERD, hyperlipidemia, hypertension, vitamin D deficiency, IBS, IgA deficiency, insomnia, left lumbosacral radiculopathy, moderate obstructive sleep apnea, osteomyelitis of the foot, PAD, rheumatoid arthritis, restless leg syndrome, systemic lupus erythematosus, history of CVA     Clinical Impressions Pt was seen for PT/OT co-evaluation this date. Prior to hospital admission, pt was mostly bedbound. Pt lives with his wife and live in caregivers/aides who provided total assist for ADLs at bed level. They use a hoyer lift for transfers to motorized W/C or recliner. Daughter assists with W/C mobility. Pt presents to acute OT this date at/near his baseline. He required total assist x2 to roll to bil sides in bed. BUE shoulder AROM is WFL, although notably weak at 3/5. Total x2 for scooting up to [facility]. Pt does not appear to have acute OT needs and do not anticipate the need for follow up OT services upon acute hospital DC.      If plan is discharge home, recommend the following:         Functional Status Assessment   Patient has not had a recent decline in their functional status     Equipment Recommendations         Recommendations for Other Services         Precautions/Restrictions   Precautions Precautions: Fall Recall of Precautions/Restrictions: Impaired Restrictions Weight Bearing  Restrictions Per Provider Order: No     Mobility Bed Mobility Overal bed mobility: Needs Assistance Bed Mobility: Rolling Rolling: Total assist, +2 for physical assistance              Transfers                          Balance                                           ADL either performed or assessed with clinical judgement   ADL Overall ADL's : At baseline                                       General ADL Comments: max/total assist at bed level     Vision         Perception         Praxis         Pertinent Vitals/Pain Pain Assessment Pain Assessment: No/denies pain     Extremity/Trunk Assessment Upper Extremity Assessment Upper Extremity Assessment: Generalized weakness (AROM in bil shoulders WFL, but notably weak at 3/5)   Lower Extremity Assessment Lower Extremity Assessment: Generalized weakness       Communication Communication Communication: Impaired Factors Affecting Communication: Difficulty expressing self   Cognition Arousal: Alert Behavior During Therapy: [facility] for tasks assessed/performed  Following commands: Impaired Following commands impaired: Follows one step commands inconsistently, Follows one step commands with increased time     Cueing  General Comments   Cueing Techniques: Verbal cues;Tactile cues  VSS on RA throughout session   Exercises     Shoulder Instructions      Home Living Family/patient expects to be discharged to:: Private residence Living Arrangements: Spouse/significant other (caregiver) Available Help at Discharge: Family;Personal care attendant;Available 24 hours/day Type of Home: House Home Access: Ramped entrance     Home Layout: One level               Home Equipment: Wheelchair - power;Hospital bed   Additional Comments: hoyer lift, RW      Prior Functioning/Environment Prior Level of  Function : Needs assist             Mobility Comments: dependent at baseline with hoyer. 24/7 Aides assist with mobility ADLs Comments: caregivers assist with ADLs at bed level, uses briefs    OT Problem List:     OT Treatment/Interventions:        OT Goals(Current goals can be found in the care plan section)       OT Frequency:       Co-evaluation PT/OT/SLP Co-Evaluation/Treatment: Yes Reason for Co-Treatment: Complexity of the patient's impairments (multi-system involvement);For patient/therapist safety          AM-PAC OT 6 Clicks Daily Activity     Outcome Measure Help from another person eating meals?: A Lot Help from another person taking care of personal grooming?: A Lot Help from another person toileting, which includes using toliet, bedpan, or urinal?: Total Help from another person bathing (including washing, rinsing, drying)?: Total Help from another person to put on and taking off regular upper body clothing?: Total Help from another person to put on and taking off regular lower body clothing?: Total 6 Click Score: 8   End of Session    Activity Tolerance: Patient tolerated treatment well Patient left: in bed;with call bell/phone within reach;with bed alarm set;with family/visitor present  OT Visit Diagnosis: Other abnormalities of gait and mobility (R26.89)                Time: 8968-8947 OT Time Calculation (min): 21 min Charges:  OT General Charges $OT Visit: 1 Visit OT Evaluation $OT Eval Low Complexity: 1 Low [name], OTR/L  07/30/23, 12:43 PM  Brayla Pat E Jonta Gastineau 07/30/2023, 12:39 PM

## 2023-07-30 NOTE — Consult Note (Signed)
 Pharmacy Consult Note - Anticoagulation  Pharmacy Consult for warfarin Indication: APS  PATIENT MEASUREMENTS: Height: 6' 5 (195.6 cm) Weight: 115.5 kg (254 lb 10.1 oz) IBW/kg (Calculated) : 89.1 HEPARIN DW (KG): 114.2  VITAL SIGNS: Temp: 97.4 F (36.3 C) (06/22 2014) Temp Source: Axillary (06/22 2014) BP: 140/64 (06/23 0500) Pulse Rate: 71 (06/23 0500)  Recent Labs    07/29/23 1643 07/30/23 0428  HGB  --  12.9*  HCT  --  39.5  PLT  --  137*  LABPROT  --  24.5*  INR  --  2.2*  CREATININE  --  1.20  TROPONINIHS 162*  --     Estimated Creatinine Clearance: 71.5 mL/min (by C-G formula based on SCr of 1.2 mg/dL).  PAST MEDICAL HISTORY: Past Medical History:  Diagnosis Date   Actinic keratosis    Alcohol abuse    APS (antiphospholipid syndrome) (HCC)    Basal cell carcinoma    BPH (benign prostatic hyperplasia)    CAD (coronary artery disease)    Cellulitis    Cervical spinal stenosis    Chronic osteomyelitis (HCC)    CKD (chronic kidney disease)    CKD (chronic kidney disease)    Clostridium difficile diarrhea    Depression    Diplopia    Fatigue    GERD (gastroesophageal reflux disease)    Hyperlipemia    Hypertension    Hypovitaminosis D    IBS (irritable bowel syndrome)    IgA deficiency (HCC)    Insomnia    Left lumbosacral radiculopathy    Moderate obstructive sleep apnea    Osteomyelitis of foot (HCC)    PAD (peripheral artery disease) (HCC)    Pruritus    RA (rheumatoid arthritis) (HCC)    Radiculopathy    Restless legs syndrome    RLS (restless legs syndrome)    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 male with PMH including antiphospholipid syndrome on warfarin, HTN, stroke, CAD is presenting with sepsis secondary to pneumonia. Patient is on chronic anticoagulation using  warfarin per chart review. Last dose prior to admission was yesterday, 6/21. INR is currently therapeutic. Pharmacy has been consulted to manage warfarin while patient is admitted.  Pertinent medications: Warfarin 3.75mg  PO daily Patient has 2.5mg  tablets at home  New drug-drug interactions: Azithromycin >> increased risk of bleeding  Goal(s) of therapy: INR 2 - 3 Monitor platelets by anticoagulation protocol: Yes   Baseline anticoagulation labs: Recent Labs    07/29/23 1034 07/30/23 0428  INR 2.3* 2.2*  HGB 14.6 12.9*  PLT 171 137*    Date INR Warfarin Dose 6/22 2.3 3.75 mg 6/23 2.2 3.75 mg   PLAN: Administer warfarin 3.75mg  PO x 1 today in accordance with home regimen Daily INR CBC at least every 3 days while on warfarin  Adriana Bolster, PharmD, BCPS Clinical Pharmacist 07/30/2023 7:06 AM

## 2023-07-30 NOTE — Progress Notes (Signed)
 PROGRESS NOTE    Kenneth Summers  FMW:[id number] DOB: 1945/07/31 DOA: 07/29/2023 PCP: Steva Clotilda DEL, NP     Brief Narrative:   From admission h and p  RADIN RAPTIS is a 78 y.o. male with medical history significant of actinic keratosis, alcohol abuse, antiphospholipid syndrome, basal cell carcinoma, BPH, CAD, cellulitis, chronic osteomyelitis, cervical spinal stenosis, CKD, C. difficile diarrhea, depression, diplopia, fatigue, GERD, hyperlipidemia, hypertension, vitamin D deficiency, IBS, IgA deficiency, insomnia, left lumbosacral radiculopathy, moderate obstructive sleep apnea, osteomyelitis of the foot, PAD, rheumatoid arthritis, restless leg syndrome, systemic lupus erythematosus, history of CVA who was brought from home due to possible sepsis and respiratory distress. EMS described that initially he was on 80% on room air and subsequently was placed on NRB at 15 L/min with his O2 sat increasing to normal levels. He has also been having generalized weakness and had several episodes of emesis.  He is still confused, but able to answer some simple questions.  He is not having headache, chest or abdominal pain at this time.  History is provided by the patient's daughter.    Assessment & Plan:   Principal Problem:   Sepsis due to pneumonia [facility]) Active Problems:   CAD (coronary artery disease)   Benign prostatic hyperplasia   Chronic kidney disease, stage 3 unspecified (HCC)   Depression   Hyperlipidemia   Essential hypertension   History of stroke   Long term (current) use of anticoagulants   PAD (peripheral artery disease) (HCC)   SLE (systemic lupus erythematosus) (HCC)   Cerebrovascular accident (CVA) due to occlusion of right posterior cerebral artery (HCC)   APS (antiphospholipid syndrome) (HCC)   Seizure disorder (HCC)   Pressure injury of skin   Elevated troponin   Acute respiratory failure with hypoxia (HCC)  # CAP Multifocal pna seen on CT. Quite possibly  aspirated at home as daughter found him with vomit next to his pillow, had no pneumonia symptoms prior to that - continue ceftriaxone/azithromycin, will add flagyl  - check respiratory swabs, sputum for culture - monitor blood cultures, urine antigens  # Acute hypoxic respiratory failure O2 low 80s on arrival, now almost weaned off ow - continue [place] o2, wean as able  # Aspiration concern Surely has some baseline dysphagia - SLP consult  # Tropinemia No chest pain or overt ischemic changes, likely demand - will continue to trend troponin - TTE pending  # History CVA # Acute stroke? Baseline left sided hemiparesis, daughter says found patient staring blankly not responding for several hours, unable to move right side, now has regained use of right hand but right leg still weak. Concern for acute CVA. CT head showed no bleed or acute process - mri brain - cont home asa, statin  # Seizure disorder Daughter says above symptoms are not typical for his seizures which involve whole body rigidity - continue home lamictal   # HTN Bp appropriate - hold home meds for permissive htn given stroke concern  # Antiphospholipid syndrome - coumadin  per pharmacy  # SLE - home prednisone  5 daily  # Restless leg - home requip   # MDD - home prozac   # Neuropathy - home gabapentin   # Goals of care Daughter says baseline quality of life is poor, is unsure father would want continued interventions, is considering transition to comfort care with hospice - palliative consult to continue goals of care discussion   DVT prophylaxis: therapeutic coumadin  Code Status: dnr/dni Family Communication: daughter at bedside 6/23  Level  of care: Stepdown Status is: Inpatient Remains inpatient appropriate because: severity of illness    Consultants:  none  Procedures: none  Antimicrobials:  See above    Subjective: No pain, has cough  Objective: Vitals:   07/30/23 0500 07/30/23 0600  07/30/23 0700 07/30/23 0800  BP: (!) 140/64 128/70 (!) 117/53   Pulse: 71 73 75   Resp: (!) 25 (!) 25 (!) 26   Temp:    (P) 98.6 F (37 C)  TempSrc:    (P) Axillary  SpO2: 96% 96% 92%   Weight:      Height:        Intake/Output Summary (Last 24 hours) at 07/30/2023 1030 Last data filed at 07/30/2023 0729 Gross per 24 hour  Intake 7286.2 ml  Output 250 ml  Net 7036.2 ml   Filed Weights   07/29/23 1028 07/29/23 1614  Weight: 120.8 kg 115.5 kg    Examination:  General exam: Appears calm and comfortable, chronically ill Respiratory system: scattered rhonchi Cardiovascular system: S1 & S2 heard, RRR. Soft systolic murmur Gastrointestinal system: Abdomen is nondistended, soft and nontender.   Central nervous system: Alert and oriented to self, place. Left sided weakness, right leg weakness Extremities: warm, trace edema Skin: no visible lesions Psychiatry: calm, some confusion    Data Reviewed: I have personally reviewed following labs and imaging studies  CBC: Recent Labs  Lab 07/29/23 1034 07/30/23 0428  WBC 11.6* 10.6*  NEUTROABS 6.2 5.0  HGB 14.6 12.9*  HCT 44.7 39.5  MCV 96.5 97.3  PLT 171 137*   Basic Metabolic Panel: Recent Labs  Lab 07/29/23 1034 07/30/23 0428  NA 135 134*  K 4.1 4.5  CL 98 102  CO2 28 27  GLUCOSE 102* 74  BUN 24* 27*  CREATININE 1.14 1.20  CALCIUM  8.4* 8.0*  MG 1.8  --    GFR: Estimated Creatinine Clearance: 71.5 mL/min (by C-G formula based on SCr of 1.2 mg/dL). Liver Function Tests: Recent Labs  Lab 07/29/23 1034 07/30/23 0428  AST 47* 45*  ALT 30 26  ALKPHOS 67 57  BILITOT 1.4* 1.3*  PROT 7.2 5.8*  ALBUMIN 3.2* 2.6*   No results for input(s): LIPASE, AMYLASE in the last 168 hours. No results for input(s): AMMONIA in the last 168 hours. Coagulation Profile: Recent Labs  Lab 07/29/23 1034 07/30/23 0428  INR 2.3* 2.2*   Cardiac Enzymes: No results for input(s): CKTOTAL, CKMB, CKMBINDEX, TROPONINI  in the last 168 hours. BNP (last 3 results) No results for input(s): PROBNP in the last 8760 hours. HbA1C: No results for input(s): HGBA1C in the last 72 hours. CBG: Recent Labs  Lab 07/29/23 1615  GLUCAP 86   Lipid Profile: No results for input(s): CHOL, HDL, LDLCALC, TRIG, CHOLHDL, LDLDIRECT in the last 72 hours. Thyroid Function Tests: No results for input(s): TSH, T4TOTAL, FREET4, T3FREE, THYROIDAB in the last 72 hours. Anemia Panel: No results for input(s): VITAMINB12, FOLATE, FERRITIN, TIBC, IRON, RETICCTPCT in the last 72 hours. Urine analysis:    Component Value Date/Time   COLORURINE YELLOW (A) 07/29/2023 1034   APPEARANCEUR CLEAR (A) 07/29/2023 1034   APPEARANCEUR Clear 04/23/2013 0903   LABSPEC 1.011 07/29/2023 1034   LABSPEC 1.004 04/23/2013 0903   PHURINE 7.0 07/29/2023 1034   GLUCOSEU NEGATIVE 07/29/2023 1034   GLUCOSEU Negative 04/23/2013 0903   HGBUR NEGATIVE 07/29/2023 1034   BILIRUBINUR NEGATIVE 07/29/2023 1034   BILIRUBINUR Negative 04/23/2013 0903   KETONESUR NEGATIVE 07/29/2023 1034   PROTEINUR NEGATIVE  07/29/2023 1034   NITRITE NEGATIVE 07/29/2023 1034   LEUKOCYTESUR NEGATIVE 07/29/2023 1034   LEUKOCYTESUR 1+ 04/23/2013 0903   Sepsis Labs: @LABRCNTIP (procalcitonin:4,lacticidven:4)  ) Recent Results (from the past 240 hours)  Culture, blood (Routine x 2)     Status: None (Preliminary result)   Collection Time: 07/29/23 10:34 AM   Specimen: BLOOD LEFT ARM  Result Value Ref Range Status   Specimen Description BLOOD LEFT ARM  Final   Special Requests   Final    BOTTLES DRAWN AEROBIC AND ANAEROBIC Blood Culture results may not be optimal due to an inadequate volume of blood received in culture bottles   Culture   Final    NO GROWTH < 24 HOURS Performed at [facility], [address], [place], [place] [address]    Report Status PENDING  Incomplete  Culture, blood (Routine x 2)     Status: None  (Preliminary result)   Collection Time: 07/29/23 10:34 AM   Specimen: BLOOD  Result Value Ref Range Status   Specimen Description BLOOD LEFT ANTECUBITAL  Final   Special Requests   Final    BOTTLES DRAWN AEROBIC AND ANAEROBIC Blood Culture results may not be optimal due to an inadequate volume of blood received in culture bottles   Culture   Final    NO GROWTH < 24 HOURS Performed at [facility], [address] [place], [place] [address]    Report Status PENDING  Incomplete  MRSA Next Gen by PCR, Nasal     Status: None   Collection Time: 07/29/23  4:13 PM   Specimen: Nasal Mucosa; Nasal Swab  Result Value Ref Range Status   MRSA by PCR Next Gen NOT DETECTED NOT DETECTED Final    Comment: (NOTE) The GeneXpert MRSA Assay (FDA approved for NASAL specimens only), is one component of a comprehensive MRSA colonization surveillance program. It is not intended to diagnose MRSA infection nor to guide or monitor treatment for MRSA infections. Test performance is not FDA approved in patients less than 75 years old. Performed at [facility], [address] [place], [place] [address]          Radiology Studies: CT Head Wo Contrast Result Date: 07/29/2023 CLINICAL DATA:  Provided history: Mental status change, unknown cause. EXAM: CT HEAD WITHOUT CONTRAST TECHNIQUE: Contiguous axial images were obtained from the base of the skull through the vertex without intravenous contrast. RADIATION DOSE REDUCTION: This exam was performed according to the departmental dose-optimization program which includes automated exposure control, adjustment of the mA and/or kV according to patient size and/or use of iterative reconstruction technique. COMPARISON:  Head CT 12/28/2020. FINDINGS: Brain: Generalized cerebral atrophy. Small chronic cortically-based infarcts again demonstrated within the medial right frontal lobe and right occipital lobe. Background chronic small vessel ischemic  changes within the cerebral white matter and within/about the bilateral basal ganglia, similar to the prior head CT of 12/28/2020. There is no acute intracranial hemorrhage. No acute demarcated cortical infarct. No extra-axial fluid collection. No evidence of an intracranial mass. No midline shift. Vascular: Calcific focus in the region of the left middle cerebral artery M1/M2 junction, new from the prior head CT of 12/28/2020 (for instance as seen on series 3, image 34). This may reflect calcified atherosclerotic plaque or an age-indeterminate calcified embolus. Atherosclerotic calcifications also present within the intracranial internal carotid and vertebral arteries. Skull: No calvarial fracture or aggressive osseous lesion. Sinuses/Orbits: No mass or acute finding within the imaged orbits. No significant paranasal  sinus disease at the imaged levels. Impression #2 called by telephone at the time of interpretation on 07/29/2023 at 12:57 pm to provider [facility] , who verbally acknowledged these results. IMPRESSION: 1. No acute intracranial hemorrhage or evidence of an acute infarct. 2. Calcific focus in the region of the left middle cerebral artery M1/M2 junction, new from the prior head CT of 12/28/2020. This may reflect calcified atherosclerotic plaque or an age-indeterminate calcified embolus. CT angiography may be obtained for further evaluation, as clinically warranted. 3. Background parenchymal atrophy, chronic small vessel ischemic disease and chronic infarcts, as described. Electronically Signed   By: Rockey Childs D.O.   On: 07/29/2023 13:01   CT CHEST ABDOMEN PELVIS W CONTRAST Result Date: 07/29/2023 CLINICAL DATA:  Respiratory distress associated with emesis and diffuse weakness EXAM: CT CHEST, ABDOMEN, AND PELVIS WITH CONTRAST TECHNIQUE: Multidetector CT imaging of the chest, abdomen and pelvis was performed following the standard protocol during bolus administration of intravenous contrast.  RADIATION DOSE REDUCTION: This exam was performed according to the departmental dose-optimization program which includes automated exposure control, adjustment of the mA and/or kV according to patient size and/or use of iterative reconstruction technique. CONTRAST:  OMNIPAQUE IOHEXOL 300 MG/ML  SOLN COMPARISON:  Same day chest radiograph, CT abdomen and pelvis dated 12/26/2020 FINDINGS: CT CHEST FINDINGS Cardiovascular: Normal heart size. No significant pericardial fluid/thickening. Great vessels are normal in course and caliber. No central pulmonary emboli. Coronary artery calcifications. Mediastinum/Nodes: Imaged thyroid gland without nodules meeting criteria for imaging follow-up by size. Normal esophagus. 10 mm subcarinal lymph node (2:51). Lungs/Pleura: The central airways are patent. Confluent patchy ground-glass opacities within the bilateral upper, dependent right middle, and right greater than left lower lobes. No focal consolidation. No pneumothorax. No pleural effusion. Musculoskeletal: Postsurgical changes of the cervical fusion at C4-C7. Grade 1 anterolisthesis at C4-5. No acute or abnormal lytic or blastic osseous lesions. Multilevel degenerative changes of the thoracic spine. CT ABDOMEN PELVIS FINDINGS Hepatobiliary: No focal hepatic lesions. No intra or extrahepatic biliary ductal dilation. Normal gallbladder. Pancreas: No focal lesions or main ductal dilation. Spleen: Normal in size without focal abnormality. Adrenals/Urinary Tract: No adrenal nodules. Minimally complicated 2.7 cm right interpolar cyst. No specific follow-up imaging recommended. No hydronephrosis. Punctate bilateral nonobstructing stones. Mildly trabeculated urinary bladder. Stomach/Bowel: Normal appearance of the stomach. No evidence of bowel wall thickening, distention, or inflammatory changes. Small to moderate volume stool throughout the colon. Colonic diverticulosis without acute diverticulitis. Normal appendix.  Vascular/Lymphatic: Aortic atherosclerosis. No enlarged abdominal or pelvic lymph nodes. Reproductive: Prostate is unremarkable. Other: Trace presacral free fluid.  No free air or fluid collection. Musculoskeletal: No acute or abnormal lytic or blastic osseous findings. Postsurgical changes of L3-S1 spinal fusion with bi iliac screws. Hardware appears intact. Small fat-containing left inguinal hernia. Mild body wall edema overlying the left flank and hips bilaterally. IMPRESSION: 1. Findings of multifocal pneumonia. 2. No acute intra-abdominal or intrapelvic abnormality. 3. Punctate bilateral nonobstructing renal stones. 4. Colonic diverticulosis without acute diverticulitis. 5. Aortic Atherosclerosis (ICD10-I70.0). Coronary artery calcifications. Assessment for potential risk factor modification, dietary therapy or pharmacologic therapy may be warranted, if clinically indicated. Electronically Signed   By: Limin  Xu M.D.   On: 07/29/2023 12:46   DG Chest Portable 1 View Result Date: 07/29/2023 CLINICAL DATA:  Hypoxia. EXAM: PORTABLE CHEST 1 VIEW COMPARISON:  12/26/2020. FINDINGS: The heart size and mediastinal contours are within normal limits. Alveolar density left mid lung could represent pneumonia or volume loss. Small bilateral pleural  effusion. Pulmonary vascular congestion. Osseous structures intact. IMPRESSION: 1. Left mid lung infiltrate or volume loss. 2. Small bilateral pleural effusions. Electronically Signed   By: Fonda Field M.D.   On: 07/29/2023 11:25        Scheduled Meds:  aspirin  EC  81 mg Oral Daily   atorvastatin   80 mg Oral QHS   calcium  carbonate  1 tablet Oral BID WC   Chlorhexidine  Gluconate Cloth  6 each Topical Daily   cholecalciferol  2,000 Units Oral Daily   ferrous sulfate  325 mg Oral BID WC   FLUoxetine   40 mg Oral Daily   fluticasone  1 spray Each Nare Daily   gabapentin   1,200 mg Oral QHS   lamoTRIgine   150 mg Oral QHS   magnesium oxide  400 mg Oral Daily    montelukast   10 mg Oral Daily   olopatadine   1 drop Both Eyes BID   pneumococcal 20-valent conjugate vaccine  0.5 mL Intramuscular Tomorrow-1000   predniSONE   5 mg Oral Q breakfast   rOPINIRole   2 mg Oral QHS   Warfarin - Pharmacist Dosing Inpatient   Does not apply q1600   Continuous Infusions:  azithromycin Stopped (07/29/23 1848)   cefTRIAXone (ROCEPHIN)  IV Stopped (07/29/23 1718)     LOS: 1 day     Devaughn KATHEE Ban, MD [facility]   If 7PM-7AM, please contact night-coverage [email or web address] Password [email or web address] 07/30/2023, 10:30 AM

## 2023-07-30 NOTE — Evaluation (Signed)
 Physical Therapy Evaluation Patient Details Name: Kenneth Summers MRN: [id number] DOB: [birth year] Today's Date: 07/30/2023  History of Present Illness  Pt is a 78 y.o. male who was brought from home due to respiratory distress, generalized weakness and several episodes of emesis. Admitted for management of acute respiratory failure with hypoxia 2/2 sepsis d/t PNA. PMH of actinic keratosis, alcohol abuse, antiphospholipid syndrome, basal cell carcinoma, BPH, CAD, cellulitis, chronic osteomyelitis, cervical spinal stenosis, CKD, C. difficile diarrhea, depression, diplopia, fatigue, GERD, hyperlipidemia, hypertension, vitamin D deficiency, IBS, IgA deficiency, insomnia, left lumbosacral radiculopathy, moderate obstructive sleep apnea, osteomyelitis of the foot, PAD, rheumatoid arthritis, restless leg syndrome, systemic lupus erythematosus, history of CVA  Clinical Impression  Orders received, chart reviewed. Patient seen prior to cancellation of orders. PTA, patient lives with wife and 2 full time paid caregivers. He is dependent with care at baseline and uses hoyer lift for OOB into power w/c. Patient currently requires totalA+2 for rolling L/R in bed for repositioning. Daughter present and stating he is at baseline for mobility. No further skilled PT needs identified acutely.        If plan is discharge home, recommend the following:     Can travel by private vehicle        Equipment Recommendations None recommended by PT  Recommendations for Other Services       Functional Status Assessment Patient has not had a recent decline in their functional status     Precautions / Restrictions Precautions Precautions: Fall Recall of Precautions/Restrictions: Impaired Restrictions Weight Bearing Restrictions Per Provider Order: No      Mobility  Bed Mobility Overal bed mobility: Needs Assistance Bed Mobility: Rolling Rolling: Total assist, +2 for physical assistance               Transfers                        Ambulation/Gait                  Stairs            Wheelchair Mobility     Tilt Bed    Modified Rankin (Stroke Patients Only)       Balance                                             Pertinent Vitals/Pain Pain Assessment Pain Assessment: No/denies pain    Home Living Family/patient expects to be discharged to:: Private residence Living Arrangements: Spouse/significant other (caregiver) Available Help at Discharge: Family;Personal care attendant;Available 24 hours/day Type of Home: House Home Access: Ramped entrance       Home Layout: One level        Prior Function Prior Level of Function : Needs assist             Mobility Comments: dependent at baseline with hoyer. 24/7 Aides assist with mobility ADLs Comments: caregivers assist with ADLs at bed level     Extremity/Trunk Assessment   Upper Extremity Assessment Upper Extremity Assessment: Defer to OT evaluation    Lower Extremity Assessment Lower Extremity Assessment: Generalized weakness       Communication   Communication Communication: Impaired Factors Affecting Communication: Difficulty expressing self    Cognition Arousal: Alert Behavior During Therapy: WFL for tasks assessed/performed   PT - Cognitive impairments: History of cognitive  impairments                         Following commands: Impaired Following commands impaired: Follows one step commands inconsistently, Follows one step commands with increased time     Cueing Cueing Techniques: Verbal cues, Tactile cues     General Comments General comments (skin integrity, edema, etc.): VSS on RA    Exercises     Assessment/Plan    PT Assessment Patient does not need any further PT services  PT Problem List         PT Treatment Interventions      PT Goals (Current goals can be found in the Care Plan section)  Acute Rehab PT  Goals PT Goal Formulation: All assessment and education complete, DC therapy    Frequency       Co-evaluation               AM-PAC PT 6 Clicks Mobility  Outcome Measure Help needed turning from your back to your side while in a flat bed without using bedrails?: Total Help needed moving from lying on your back to sitting on the side of a flat bed without using bedrails?: Total Help needed moving to and from a bed to a chair (including a wheelchair)?: Total Help needed standing up from a chair using your arms (e.g., wheelchair or bedside chair)?: Total Help needed to walk in hospital room?: Total Help needed climbing 3-5 steps with a railing? : Total 6 Click Score: 6    End of Session   Activity Tolerance: Patient tolerated treatment well Patient left: in bed;with call bell/phone within reach;with bed alarm set;with family/visitor present Nurse Communication: Mobility status PT Visit Diagnosis: Muscle weakness (generalized) (M62.81)    Time: 8968-8946 PT Time Calculation (min) (ACUTE ONLY): 22 min   Charges:   PT Evaluation $PT Eval Moderate Complexity: 1 Mod   PT General Charges $$ ACUTE PT VISIT: 1 Visit         Maryanne Finder, PT, DPT Physical Therapist - [facility]  [facility]   Marciana Uplinger A Cleopha Indelicato 07/30/2023, 12:13 PM

## 2023-07-30 NOTE — Progress Notes (Signed)
*  PRELIMINARY RESULTS* Echocardiogram 2D Echocardiogram has been performed.  Kenneth Summers 07/30/2023, 2:50 PM

## 2023-07-30 NOTE — Consult Note (Cosign Needed Addendum)
 Consultation Note Date: 07/30/2023   Patient Name: Kenneth Summers  DOB: 12-17-1945  MRN: [id number]  Age / Sex: 78 y.o., male  PCP: Steva Clotilda DEL, NP Referring Physician: Kandis Devaughn Sayres, MD  Reason for Consultation: Establishing goals of care  HPI/Patient Profile: PER  H&P : Kenneth Summers is a 78 y.o. male with medical history significant of actinic keratosis, alcohol abuse, antiphospholipid syndrome, basal cell carcinoma, BPH, CAD, cellulitis, chronic osteomyelitis, cervical spinal stenosis, CKD, C. difficile diarrhea, depression, diplopia, fatigue, GERD, hyperlipidemia, hypertension, vitamin D deficiency, IBS, IgA deficiency, insomnia, left lumbosacral radiculopathy, moderate obstructive sleep apnea, osteomyelitis of the foot, PAD, rheumatoid arthritis, restless leg syndrome, systemic lupus erythematosus, history of CVA who was brought from home due to possible sepsis and respiratory distress. EMS described that initially he was on 80% on room air and subsequently was placed on NRB at 15 L/min with his O2 sat increasing to normal levels. He has also been having generalized weakness and had several episodes of emesis.  He is still confused, but able to answer some simple questions.  He is not having headache, chest or abdominal pain at this time.  History is provided by the patient's daughter.   Clinical Assessment and Goals of Care:  Notes and labs reviewed.  Updates provided by CCM staff.  Up to see patient, he is currently resting in bed with family at bedside.  Daughter stepped out to speak with me.  Daughter states that she has a brother who lives in [place] .  She states patient's wife has dementia and requires care herself. She states that she is her father's H POA if he is unable to make decisions himself.  Daughter discusses that patient lives with his wife.  She states there are 2 caregivers  that rotate 7 days on and 7 days off.  She advises patient had a stroke around 6 years ago and has deficits from this such that he is unable to walk and must use a Nurse, adult.  She states at baseline he is able to feed himself.  She states at baseline the patient's quality of life is poor where he sits in a chair or stays in the bed.    She discusses that at baseline he is typically oriented.  She states at times he will say things that are inappropriate.  She discusses that since admission he has been more confused, particularly today as the day has worn on.   Discussed following up tomorrow to discuss goals of care with hopes that patient will be rested and more able to do so.   SUMMARY OF RECOMMENDATIONS   PMT will follow.       Primary Diagnoses: Present on Admission:  Sepsis due to pneumonia (HCC)  APS (antiphospholipid syndrome) (HCC)  Essential hypertension  Hyperlipidemia  SLE (systemic lupus erythematosus) (HCC)  CAD (coronary artery disease)  Benign prostatic hyperplasia  Chronic kidney disease, stage 3 unspecified (HCC)  Depression  Elevated troponin  Acute respiratory failure with hypoxia (HCC)  Pressure  injury of skin  Cerebrovascular accident (CVA) due to occlusion of right posterior cerebral artery (HCC)  PAD (peripheral artery disease) (HCC)   I have reviewed the medical record, interviewed the patient and family, and examined the patient. The following aspects are pertinent.  Past Medical History:  Diagnosis Date   Actinic keratosis    Alcohol abuse    APS (antiphospholipid syndrome) (HCC)    Basal cell carcinoma    BPH (benign prostatic hyperplasia)    CAD (coronary artery disease)    Cellulitis    Cervical spinal stenosis    Chronic osteomyelitis (HCC)    CKD (chronic kidney disease)    CKD (chronic kidney disease)    Clostridium difficile diarrhea    Depression    Diplopia    Fatigue    GERD (gastroesophageal reflux disease)    Hyperlipemia     Hypertension    Hypovitaminosis D    IBS (irritable bowel syndrome)    IgA deficiency (HCC)    Insomnia    Left lumbosacral radiculopathy    Moderate obstructive sleep apnea    Osteomyelitis of foot (HCC)    PAD (peripheral artery disease) (HCC)    Pruritus    RA (rheumatoid arthritis) (HCC)    Radiculopathy    Restless legs syndrome    RLS (restless legs syndrome)    SLE (systemic lupus erythematosus related syndrome) (HCC)    Squamous cell carcinoma of skin 01/27/2019   right mid lower ear helix   Stroke (HCC)    Toxic maculopathy of both eyes    Social History   Socioeconomic History   Marital status: Married    Spouse name: Not on file   Number of children: Not on file   Years of education: Not on file   Highest education level: Not on file  Occupational History   Not on file  Tobacco Use   Smoking status: Former    Current packs/day: 1.00    Average packs/day: 1 pack/day for 20.0 years (20.0 ttl pk-yrs)    Types: Cigarettes   Smokeless tobacco: Never  Substance and Sexual Activity   Alcohol use: Not Currently    Comment: Socially   Drug use: No   Sexual activity: Not Currently  Other Topics Concern   Not on file  Social History Narrative   Not on file   Social Drivers of Health   Financial Resource Strain: Medium Risk (06/06/2018)   Received from [facility]   Overall Financial Resource Strain (CARDIA)    Difficulty of Paying Living Expenses: Somewhat hard  Food Insecurity: No Food Insecurity (07/29/2023)   Hunger Vital Sign    Worried About Running Out of Food in the Last Year: Never true    Ran Out of Food in the Last Year: Never true  Transportation Needs: No Transportation Needs (07/29/2023)   PRAPARE - Transportation    Lack of Transportation (Medical): No    Lack of Transportation (Non-Medical): No  Physical Activity: Unknown (06/06/2018)   Received from [facility]   Exercise Vital Sign    On average, how many  days per week do you engage in moderate to strenuous exercise (like a brisk walk)?: 0 days    Minutes of Exercise per Session: Not on file  Stress: Stress Concern Present (06/06/2018)   Received from [facility] - Occupational Stress Questionnaire    Feeling of Stress : To some extent  Social Connections: Socially Isolated (07/29/2023)   Social Connection and Isolation Panel    Frequency of Communication with Friends and Family: Once a week    Frequency of Social Gatherings with Friends and Family: Never    Attends Religious Services: Never    Diplomatic Services operational officer: No    Attends Engineer, structural: Never    Marital Status: Married   Family History  Problem Relation Age of Onset   COPD Mother    Osteoporosis Mother    Stroke Father    Osteoporosis Sister    Lupus Niece    Scheduled Meds:  aspirin  EC  81 mg Oral Daily   atorvastatin   80 mg Oral QHS   calcium  carbonate  1 tablet Oral BID WC   Chlorhexidine  Gluconate Cloth  6 each Topical Daily   cholecalciferol  2,000 Units Oral Daily   dexamethasone  10 mg Oral Daily   ferrous sulfate  325 mg Oral BID WC   FLUoxetine   40 mg Oral Daily   fluticasone  1 spray Each Nare Daily   gabapentin   1,200 mg Oral QHS   lamoTRIgine   150 mg Oral QHS   magnesium oxide  400 mg Oral Daily   montelukast   10 mg Oral Daily   olopatadine   1 drop Both Eyes BID   pneumococcal 20-valent conjugate vaccine  0.5 mL Intramuscular Tomorrow-1000   rOPINIRole   2 mg Oral QHS   warfarin  3.75 mg Oral ONCE-1600   Warfarin - Pharmacist Dosing Inpatient   Does not apply q1600   Continuous Infusions:  ampicillin -sulbactam (UNASYN ) IV 3 g (07/30/23 1139)   azithromycin Stopped (07/29/23 1848)   PRN Meds:.acetaminophen  **OR** acetaminophen , albuterol , diphenoxylate-atropine, LORazepam, ondansetron  **OR** ondansetron  (ZOFRAN ) IV Medications Prior to Admission:  Prior to  Admission medications   Medication Sig Start Date End Date Taking? Authorizing Provider  ascorbic acid (VITAMIN C) 500 MG tablet Take 500 mg by mouth daily.   Yes [provider]  aspirin  EC 81 MG tablet Take by mouth daily.   Yes [provider]  atorvastatin  (LIPITOR) 80 MG tablet Take 80 mg by mouth Nightly.   Yes [provider]  calcium  carbonate (OS-CAL - DOSED IN MG OF ELEMENTAL CALCIUM ) 1250 (500 Ca) MG tablet Take 1 tablet by mouth 2 (two) times daily with a meal.   Yes [provider]  cholecalciferol (VITAMIN D3) 25 MCG (1000 UNIT) tablet Take 2,000 Units by mouth daily.   Yes [provider]  Cyanocobalamin (VITAMIN B 12 PO) Take by mouth.   Yes [provider]  diphenoxylate-atropine (LOMOTIL) 2.5-0.025 MG tablet Take 1 tablet by mouth 4 (four) times daily as needed for diarrhea or loose stools.   Yes [provider]  ferrous sulfate 220 (44 Fe) MG/5ML solution Take 325 mg by mouth 2 (two) times daily with a meal.   Yes [provider]  FLUoxetine  (PROZAC ) 40 MG capsule Take 40 mg by mouth daily.   Yes [provider]  fluticasone (FLONASE) 50 MCG/ACT nasal spray Place into both nostrils daily.   Yes [provider]  gabapentin  (NEURONTIN ) 400 MG capsule Take 1,200 mg by mouth at bedtime.   Yes [provider]  hydrOXYzine (ATARAX) 25 MG tablet Take 25 mg by mouth 3 (three) times daily as needed for itching.   Yes [provider]  lamoTRIgine  (LAMICTAL ) 25 MG tablet Take 150 mg by mouth at bedtime.   Yes [provider]  magnesium oxide (MAG-OX) 400 (240 Mg) MG tablet Take 500 mg by mouth daily.   Yes [provider]  montelukast  (SINGULAIR ) 10 MG tablet Take 10 mg by mouth daily.   Yes [provider]  olopatadine  (PATANOL) 0.1 % ophthalmic solution Place 1 drop into both eyes 2 (two) times daily.   Yes [provider]  predniSONE  (DELTASONE )  5 MG tablet Take 5 mg by mouth daily with breakfast.   Yes [provider]  rOPINIRole  (REQUIP ) 2 MG tablet Take 2 mg by mouth at bedtime.   Yes [provider]  warfarin (COUMADIN ) 2.5 MG tablet Take 2.5 mg by mouth daily. 08/29/18  Yes [provider]  hydrocortisone  2.5 % cream Apply to affected areas face 1-2 times daily as directed. 05/20/19   Jackquline Sawyer, MD  ketoconazole  (NIZORAL ) 2 % cream Apply to affected areas face 1-2 times daily as directed. 05/20/19   Jackquline Sawyer, MD   Allergies  Allergen Reactions   Hydroxychloroquine Other (See Comments)    Other reaction(s): Other (See Comments)  Causing blindness Visual problems    Review of Systems  Unable to perform ROS   Physical Exam HENT:     Head: Normocephalic.  Pulmonary:     Effort: Pulmonary effort is normal.   Neurological:     Mental Status: He is alert.     Vital Signs: BP (!) 117/53   Pulse 75   Temp (P) 98.6 F (37 C) (Axillary)   Resp (!) 26   Ht 6' 5 (1.956 m)   Wt 115.5 kg   SpO2 92%   BMI 30.19 kg/m  Pain Scale: 0-10   Pain Score: 3    SpO2: SpO2: 92 % O2 Device:SpO2: 92 % O2 Flow Rate: .O2 Flow Rate (L/min): 5 L/min  IO: Intake/output summary:  Intake/Output Summary (Last 24 hours) at 07/30/2023 1559 Last data filed at 07/30/2023 9270 Gross per 24 hour  Intake 3719.53 ml  Output 250 ml  Net 3469.53 ml    LBM: Last BM Date : 07/29/23 Baseline Weight: Weight: 120.8 kg Most recent weight: Weight: 115.5 kg        Signed by: Camelia Lewis, NP   Please contact Palliative Medicine Team phone at [phone] for questions and concerns.  For individual provider: See Tracey

## 2023-07-31 ENCOUNTER — Inpatient Hospital Stay

## 2023-07-31 DIAGNOSIS — J1282 Pneumonia due to coronavirus disease 2019: Secondary | ICD-10-CM | POA: Diagnosis not present

## 2023-07-31 DIAGNOSIS — U071 COVID-19: Secondary | ICD-10-CM | POA: Diagnosis not present

## 2023-07-31 DIAGNOSIS — Z7189 Other specified counseling: Secondary | ICD-10-CM | POA: Diagnosis not present

## 2023-07-31 LAB — BASIC METABOLIC PANEL WITH GFR
Anion gap: 5 (ref 5–15)
BUN: 23 mg/dL (ref 8–23)
CO2: 27 mmol/L (ref 22–32)
Calcium: 7.9 mg/dL — ABNORMAL LOW (ref 8.9–10.3)
Chloride: 103 mmol/L (ref 98–111)
Creatinine, Ser: 0.85 mg/dL (ref 0.61–1.24)
GFR, Estimated: >60 mL/min (ref >60)
Glucose, Bld: 128 mg/dL — ABNORMAL HIGH (ref 70–99)
Potassium: 4.5 mmol/L (ref 3.5–5.1)
Sodium: 135 mmol/L (ref 135–145)

## 2023-07-31 LAB — CBC
HCT: 38.7 % — ABNORMAL LOW (ref 39.0–52.0)
Hemoglobin: 12.5 g/dL — ABNORMAL LOW (ref 13.0–17.0)
MCH: 31.3 pg (ref 26.0–34.0)
MCHC: 32.3 g/dL (ref 30.0–36.0)
MCV: 97 fL (ref 80.0–100.0)
Platelets: 122 10*3/uL — ABNORMAL LOW (ref 150–400)
RBC: 3.99 MIL/uL — ABNORMAL LOW (ref 4.22–5.81)
RDW: 14 % (ref 11.5–15.5)
WBC: 9.9 10*3/uL (ref 4.0–10.5)
nRBC: 0 % (ref 0.0–0.2)

## 2023-07-31 LAB — LEGIONELLA PNEUMOPHILA SEROGP 1 UR AG: L. pneumophila Serogp 1 Ur Ag: NEGATIVE

## 2023-07-31 LAB — PROTIME-INR
INR: 2.5 — ABNORMAL HIGH (ref 0.8–1.2)
Prothrombin Time: 27.1 s — ABNORMAL HIGH (ref 11.4–15.2)

## 2023-07-31 MED ORDER — ORAL CARE MOUTH RINSE
15.0000 mL | OROMUCOSAL | Status: DC
  Administered 2023-07-31 – 2023-08-02 (×5): 15 mL via OROMUCOSAL

## 2023-07-31 MED ORDER — SCOPOLAMINE 1 MG/3DAYS TD PT72
1.0000 | MEDICATED_PATCH | TRANSDERMAL | Status: DC
  Administered 2023-07-31: 1.5 mg via TRANSDERMAL
  Filled 2023-07-31: qty 1

## 2023-07-31 MED ORDER — WARFARIN SODIUM 2.5 MG PO TABS
3.7500 mg | ORAL_TABLET | Freq: Once | ORAL | Status: AC
  Administered 2023-07-31: 3.75 mg via ORAL
  Filled 2023-07-31: qty 1

## 2023-07-31 NOTE — Progress Notes (Signed)
 This RN attempted to give patient oral medications crushed in applesauce as he was able to tolerate on night shift. Patient is sleepy but arousable by voice and oriented to person and place. This RN placed a small spoonful of applesauce in the patient's mouth and he was unable to follow instructions to swallow. Mouth was suctioned and will attempt again after patient's bath when he is hopefully more alert.

## 2023-07-31 NOTE — Consult Note (Signed)
 Pharmacy Consult Note - Anticoagulation  Pharmacy Consult for warfarin Indication: APS  PATIENT MEASUREMENTS: Height: 6' 5 (195.6 cm) Weight: 120.5 kg (265 lb 10.5 oz) IBW/kg (Calculated) : 89.1 HEPARIN DW (KG): 114.2  VITAL SIGNS: Temp: 98.3 F (36.8 C) (06/24 0400) Temp Source: Axillary (06/24 0400) BP: 145/86 (06/24 0700) Pulse Rate: 77 (06/24 0700)  Recent Labs    07/30/23 1111 07/31/23 0320  HGB  --  12.5*  HCT  --  38.7*  PLT  --  122*  LABPROT  --  27.1*  INR  --  2.5*  CREATININE  --  0.85  TROPONINIHS 156*  --     Estimated Creatinine Clearance: 103 mL/min (by C-G formula based on SCr of 0.85 mg/dL).  PAST MEDICAL HISTORY: Past Medical History:  Diagnosis Date   Actinic keratosis    Alcohol abuse    APS (antiphospholipid syndrome) (HCC)    Basal cell carcinoma    BPH (benign prostatic hyperplasia)    CAD (coronary artery disease)    Cellulitis    Cervical spinal stenosis    Chronic osteomyelitis (HCC)    CKD (chronic kidney disease)    CKD (chronic kidney disease)    Clostridium difficile diarrhea    Depression    Diplopia    Fatigue    GERD (gastroesophageal reflux disease)    Hyperlipemia    Hypertension    Hypovitaminosis D    IBS (irritable bowel syndrome)    IgA deficiency (HCC)    Insomnia    Left lumbosacral radiculopathy    Moderate obstructive sleep apnea    Osteomyelitis of foot (HCC)    PAD (peripheral artery disease) (HCC)    Pruritus    RA (rheumatoid arthritis) (HCC)    Radiculopathy    Restless legs syndrome    RLS (restless legs syndrome)    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 male with PMH including antiphospholipid syndrome on warfarin, HTN, stroke, CAD is presenting with sepsis secondary to pneumonia. Patient is on chronic anticoagulation using  warfarin per chart review. Last dose prior to admission was yesterday, 6/21. INR is currently therapeutic. Pharmacy has been consulted to manage warfarin while patient is admitted.  Pertinent medications: Warfarin 3.75mg  PO daily Patient has 2.5mg  tablets at home  New drug-drug interactions: Azithromycin >> increased risk of bleeding  Goal(s) of therapy: INR 2 - 3 Monitor platelets by anticoagulation protocol: Yes   Baseline anticoagulation labs: Recent Labs    07/29/23 1034 07/30/23 0428 07/31/23 0320  INR 2.3* 2.2* 2.5*  HGB 14.6 12.9* 12.5*  PLT 171 137* 122*    Date INR Warfarin Dose 6/22 2.3 3.75 mg 6/23 2.2 3.75 mg 6/24  2.5 3.75 mg   PLAN: Administer warfarin 3.75mg  PO x 1 today in accordance with home regimen Daily INR CBC at least every 3 days while on warfarin  Adriana Bolster, PharmD, BCPS Clinical Pharmacist 07/31/2023 7:11 AM

## 2023-07-31 NOTE — Progress Notes (Signed)
 PROGRESS NOTE    Kenneth Summers  FMW:[id number] DOB: [birth year] DOA: 07/29/2023 PCP: Steva Clotilda DEL, NP     Brief Narrative:   From admission h and p  Kenneth Summers is a 78 y.o. male with medical history significant of actinic keratosis, alcohol abuse, antiphospholipid syndrome, basal cell carcinoma, BPH, CAD, cellulitis, chronic osteomyelitis, cervical spinal stenosis, CKD, C. difficile diarrhea, depression, diplopia, fatigue, GERD, hyperlipidemia, hypertension, vitamin D deficiency, IBS, IgA deficiency, insomnia, left lumbosacral radiculopathy, moderate obstructive sleep apnea, osteomyelitis of the foot, PAD, rheumatoid arthritis, restless leg syndrome, systemic lupus erythematosus, history of CVA who was brought from home due to possible sepsis and respiratory distress. EMS described that initially he was on 80% on room air and subsequently was placed on NRB at 15 L/min with his O2 sat increasing to normal levels. He has also been having generalized weakness and had several episodes of emesis.  He is still confused, but able to answer some simple questions.  He is not having headache, chest or abdominal pain at this time.  History is provided by the patient's daughter.    Assessment & Plan:   Principal Problem:   Pneumonia due to COVID-19 virus Active Problems:   CAD (coronary artery disease)   Benign prostatic hyperplasia   Chronic kidney disease, stage 3 unspecified (HCC)   Depression   Hyperlipidemia   Essential hypertension   History of stroke   Long term (current) use of anticoagulants   PAD (peripheral artery disease) (HCC)   SLE (systemic lupus erythematosus) (HCC)   Cerebrovascular accident (CVA) due to occlusion of right posterior cerebral artery (HCC)   APS (antiphospholipid syndrome) (HCC)   Seizure disorder (HCC)   Pressure injury of skin   Sepsis due to pneumonia (HCC)   Elevated troponin   Acute respiratory failure with hypoxia (HCC)  # Covid  pneumonia # Aspiration pneumonia Multifocal pna seen on CT. Covid positive. Quite possibly aspirated at home as daughter found him with vomit next to his pillow, had no pneumonia symptoms prior to that. Nursing reports a possible aspiration event overnight, not clear whether anyone was notified - continue unasyn  - continue decadron - check kub, cxr  # Sepsis, severe 2/2 above, by lactate, fever. Hemodynamically stable  # Acute hypoxic respiratory failure O2 low 80s on arrival, now almost weaned off o2 - continue [place] o2, wean as able  # Aspiration concern Surely has some baseline dysphagia - SLP following cleared for dysphagia 2 diet  # Tropinemia No chest pain or overt ischemic changes, likely demand. No RMWAs or other sig findings on TTE  # History CVA # Acute stroke concern Baseline left sided hemiparesis, daughter says found patient staring blankly not responding for several hours, unable to move right side, now has regained use of right hand but right leg still weak. Concern for acute CVA. CT head showed no bleed or acute process, MRI nothing acute either - cont home asa, statin  # Seizure disorder Daughter says above symptoms are not typical for his seizures which involve whole body rigidity, no report of seizure-like activity here - continue home lamictal   # HTN Doesn't appear to be on meds at home, BPS here are reasonable - monitor for now  # Antiphospholipid syndrome INR therapeutic - coumadin  per pharmacy  # SLE - decadron as above for prednisone  5 daily  # Restless leg - home requip   # MDD - home prozac   # Neuropathy - home gabapentin   # Goals of care  Daughter says baseline quality of life is poor, is unsure father would want continued interventions, is considering transition to comfort care. Though today has backed off from that. Palliative is following   DVT prophylaxis: therapeutic coumadin  Code Status: dnr/dni Family Communication: daughter at  bedside 6/24  Level of care: Stepdown Status is: Inpatient Remains inpatient appropriate because: severity of illness    Consultants:  none  Procedures: none  Antimicrobials:  See above    Subjective: No pain, aspiration event overnight  Objective: Vitals:   07/31/23 0600 07/31/23 0700 07/31/23 0800 07/31/23 0900  BP: (!) 149/92 (!) 145/86 (!) 151/85 (!) 151/85  Pulse: 76 77 74 69  Resp: (!) 24 (!) 23 (!) 21 17  Temp:   97.8 F (36.6 C)   TempSrc:   Axillary   SpO2: 100% 100% 100% 99%  Weight:      Height:        Intake/Output Summary (Last 24 hours) at 07/31/2023 1209 Last data filed at 07/31/2023 0700 Gross per 24 hour  Intake 600.41 ml  Output 1600 ml  Net -999.59 ml   Filed Weights   07/29/23 1028 07/29/23 1614 07/31/23 0400  Weight: 120.8 kg 115.5 kg 120.5 kg    Examination:  General exam: Appears calm and comfortable, chronically ill Respiratory system: scattered rhonchi Cardiovascular system: S1 & S2 heard, RRR. Soft systolic murmur Gastrointestinal system: Abdomen is nondistended, soft and nontender.   Central nervous system: Alert and oriented to self, . Left sided weakness, right leg weakness Extremities: warm, trace edema Skin: no visible lesions Psychiatry: calm, some confusion    Data Reviewed: I have personally reviewed following labs and imaging studies  CBC: Recent Labs  Lab 07/29/23 1034 07/30/23 0428 07/31/23 0320  WBC 11.6* 10.6* 9.9  NEUTROABS 6.2 5.0  --   HGB 14.6 12.9* 12.5*  HCT 44.7 39.5 38.7*  MCV 96.5 97.3 97.0  PLT 171 137* 122*   Basic Metabolic Panel: Recent Labs  Lab 07/29/23 1034 07/30/23 0428 07/31/23 0320  NA 135 134* 135  K 4.1 4.5 4.5  CL 98 102 103  CO2 28 27 27   GLUCOSE 102* 74 128*  BUN 24* 27* 23  CREATININE 1.14 1.20 0.85  CALCIUM  8.4* 8.0* 7.9*  MG 1.8  --   --    GFR: Estimated Creatinine Clearance: 103 mL/min (by C-G formula based on SCr of 0.85 mg/dL). Liver Function Tests: Recent  Labs  Lab 07/29/23 1034 07/30/23 0428  AST 47* 45*  ALT 30 26  ALKPHOS 67 57  BILITOT 1.4* 1.3*  PROT 7.2 5.8*  ALBUMIN 3.2* 2.6*   No results for input(s): LIPASE, AMYLASE in the last 168 hours. No results for input(s): AMMONIA in the last 168 hours. Coagulation Profile: Recent Labs  Lab 07/29/23 1034 07/30/23 0428 07/31/23 0320  INR 2.3* 2.2* 2.5*   Cardiac Enzymes: No results for input(s): CKTOTAL, CKMB, CKMBINDEX, TROPONINI in the last 168 hours. BNP (last 3 results) No results for input(s): PROBNP in the last 8760 hours. HbA1C: No results for input(s): HGBA1C in the last 72 hours. CBG: Recent Labs  Lab 07/29/23 1615  GLUCAP 86   Lipid Profile: No results for input(s): CHOL, HDL, LDLCALC, TRIG, CHOLHDL, LDLDIRECT in the last 72 hours. Thyroid Function Tests: No results for input(s): TSH, T4TOTAL, FREET4, T3FREE, THYROIDAB in the last 72 hours. Anemia Panel: No results for input(s): VITAMINB12, FOLATE, FERRITIN, TIBC, IRON, RETICCTPCT in the last 72 hours. Urine analysis:    Component Value  Date/Time   COLORURINE YELLOW (A) 07/29/2023 1034   APPEARANCEUR CLEAR (A) 07/29/2023 1034   APPEARANCEUR Clear 04/23/2013 0903   LABSPEC 1.011 07/29/2023 1034   LABSPEC 1.004 04/23/2013 0903   PHURINE 7.0 07/29/2023 1034   GLUCOSEU NEGATIVE 07/29/2023 1034   GLUCOSEU Negative 04/23/2013 0903   HGBUR NEGATIVE 07/29/2023 1034   BILIRUBINUR NEGATIVE 07/29/2023 1034   BILIRUBINUR Negative 04/23/2013 0903   KETONESUR NEGATIVE 07/29/2023 1034   PROTEINUR NEGATIVE 07/29/2023 1034   NITRITE NEGATIVE 07/29/2023 1034   LEUKOCYTESUR NEGATIVE 07/29/2023 1034   LEUKOCYTESUR 1+ 04/23/2013 0903   Sepsis Labs: @LABRCNTIP (procalcitonin:4,lacticidven:4)  ) Recent Results (from the past 240 hours)  Culture, blood (Routine x 2)     Status: None (Preliminary result)   Collection Time: 07/29/23 10:34 AM   Specimen: BLOOD LEFT ARM   Result Value Ref Range Status   Specimen Description BLOOD LEFT ARM  Final   Special Requests   Final    BOTTLES DRAWN AEROBIC AND ANAEROBIC Blood Culture results may not be optimal due to an inadequate volume of blood received in culture bottles   Culture   Final    NO GROWTH 2 DAYS Performed at [facility], [address] [place], [place] [address]    Report Status PENDING  Incomplete  Culture, blood (Routine x 2)     Status: None (Preliminary result)   Collection Time: 07/29/23 10:34 AM   Specimen: BLOOD  Result Value Ref Range Status   Specimen Description BLOOD LEFT ANTECUBITAL  Final   Special Requests   Final    BOTTLES DRAWN AEROBIC AND ANAEROBIC Blood Culture results may not be optimal due to an inadequate volume of blood received in culture bottles   Culture   Final    NO GROWTH 2 DAYS Performed at [facility], [address] [place], [place] [address]    Report Status PENDING  Incomplete  MRSA Next Gen by PCR, Nasal     Status: None   Collection Time: 07/29/23  4:13 PM   Specimen: Nasal Mucosa; Nasal Swab  Result Value Ref Range Status   MRSA by PCR Next Gen NOT DETECTED NOT DETECTED Final    Comment: (NOTE) The GeneXpert MRSA Assay (FDA approved for NASAL specimens only), is one component of a comprehensive MRSA colonization surveillance program. It is not intended to diagnose MRSA infection nor to guide or monitor treatment for MRSA infections. Test performance is not FDA approved in patients less than 51 years old. Performed at [facility], [address] [place], [place] [address]   Respiratory (~20 pathogens) panel by PCR     Status: None   Collection Time: 07/30/23 10:54 AM   Specimen: Nasopharyngeal Swab; Respiratory  Result Value Ref Range Status   Adenovirus NOT DETECTED NOT DETECTED Final   Coronavirus 229E NOT DETECTED NOT DETECTED Final    Comment: (NOTE) The Coronavirus on the Respiratory Panel, DOES NOT test for  the novel  Coronavirus (2019 nCoV)    Coronavirus HKU1 NOT DETECTED NOT DETECTED Final   Coronavirus NL63 NOT DETECTED NOT DETECTED Final   Coronavirus OC43 NOT DETECTED NOT DETECTED Final   Metapneumovirus NOT DETECTED NOT DETECTED Final   Rhinovirus / Enterovirus NOT DETECTED NOT DETECTED Final   Influenza A NOT DETECTED NOT DETECTED Final   Influenza B NOT DETECTED NOT DETECTED Final   Parainfluenza Virus 1 NOT DETECTED NOT DETECTED Final   Parainfluenza Virus 2 NOT DETECTED NOT DETECTED Final   Parainfluenza  Virus 3 NOT DETECTED NOT DETECTED Final   Parainfluenza Virus 4 NOT DETECTED NOT DETECTED Final   Respiratory Syncytial Virus NOT DETECTED NOT DETECTED Final   Bordetella pertussis NOT DETECTED NOT DETECTED Final   Bordetella Parapertussis NOT DETECTED NOT DETECTED Final   Chlamydophila pneumoniae NOT DETECTED NOT DETECTED Final   Mycoplasma pneumoniae NOT DETECTED NOT DETECTED Final    Comment: Performed at [facility], [address] [place], [place] [address]  Resp panel by RT-PCR (RSV, Flu A&B, Covid)     Status: Abnormal   Collection Time: 07/30/23 10:54 AM  Result Value Ref Range Status   SARS Coronavirus 2 by RT PCR POSITIVE (A) NEGATIVE Final    Comment: (NOTE) SARS-CoV-2 target nucleic acids are DETECTED.  The SARS-CoV-2 RNA is generally detectable in upper respiratory specimens during the acute phase of infection. Positive results are indicative of the presence of the identified virus, but do not rule out bacterial infection or co-infection with other pathogens not detected by the test. Clinical correlation with patient history and other diagnostic information is necessary to determine patient infection status. The expected result is Negative.  Fact Sheet for Patients: BloggerCourse.com  Fact Sheet for Healthcare Providers: SeriousBroker.it  This test is not yet approved or cleared by the [place]   FDA and  has been authorized for detection and/or diagnosis of SARS-CoV-2 by FDA under an Emergency Use Authorization (EUA).  This EUA will remain in effect (meaning this test can be used) for the duration of  the COVID-19 declaration under Section 564(b)(1) of the A ct, 21 U.S.C. section 360bbb-3(b)(1), unless the authorization is terminated or revoked sooner.     Influenza A by PCR NEGATIVE NEGATIVE Final   Influenza B by PCR NEGATIVE NEGATIVE Final    Comment: (NOTE) The Xpert Xpress SARS-CoV-2/FLU/RSV plus assay is intended as an aid in the diagnosis of influenza from Nasopharyngeal swab specimens and should not be used as a sole basis for treatment. Nasal washings and aspirates are unacceptable for Xpert Xpress SARS-CoV-2/FLU/RSV testing.  Fact Sheet for Patients: BloggerCourse.com  Fact Sheet for Healthcare Providers: SeriousBroker.it  This test is not yet approved or cleared by the [place]  FDA and has been authorized for detection and/or diagnosis of SARS-CoV-2 by FDA under an Emergency Use Authorization (EUA). This EUA will remain in effect (meaning this test can be used) for the duration of the COVID-19 declaration under Section 564(b)(1) of the Act, 21 U.S.C. section 360bbb-3(b)(1), unless the authorization is terminated or revoked.     Resp Syncytial Virus by PCR NEGATIVE NEGATIVE Final    Comment: (NOTE) Fact Sheet for Patients: BloggerCourse.com  Fact Sheet for Healthcare Providers: SeriousBroker.it  This test is not yet approved or cleared by the [place]  FDA and has been authorized for detection and/or diagnosis of SARS-CoV-2 by FDA under an Emergency Use Authorization (EUA). This EUA will remain in effect (meaning this test can be used) for the duration of the COVID-19 declaration under Section 564(b)(1) of the Act, 21 U.S.C. section  360bbb-3(b)(1), unless the authorization is terminated or revoked.  Performed at [facility], [address] [place], [place] [address]          Radiology Studies: MR BRAIN WO CONTRAST Result Date: 07/30/2023 CLINICAL DATA:  right sided weakness, history stroke EXAM: MRI HEAD WITHOUT CONTRAST TECHNIQUE: Multiplanar, multiecho pulse sequences of the brain and surrounding structures were obtained without intravenous contrast. COMPARISON:  CT of the head dated July 29, 2023  and report of an MRI of the head dated December 03, 2000. FINDINGS: Brain: There is moderate diffuse cerebral and cerebellar volume loss present. There are chronic focal encephalomalacia changes within the right occipital lobe. There are confluent areas of increased T2 signal present in the periventricular and deep cerebral white matter. Four Vascular: Normal vascular flow voids. Skull and upper cervical spine: Normal marrow signal. No osseous lesions are present. Sinuses/Orbits: Status post bilateral lens surgery. The paranasal sinuses are clear. Other: None. IMPRESSION: 1. Focal encephalomalacia changes within the right occipital lobe from remote cortical infarct. Moderate periventricular and deep cerebral white matter disease. No apparent acute process. Electronically Signed   By: Evalene Coho M.D.   On: 07/30/2023 18:16   ECHOCARDIOGRAM COMPLETE Result Date: 07/30/2023    ECHOCARDIOGRAM REPORT   Patient Name:   Kenneth Summers Date of Exam: 07/30/2023 Medical Rec #:  [id number]        Height:       77.0 in Accession #:    [id number]       Weight:       254.6 lb Date of Birth:  [birth year]       BSA:          2.479 m Patient Age:    77 years         BP:           117/53 mmHg Patient Gender: M                HR:           75 bpm. Exam Location:  [facility] Procedure: 2D Echo, Cardiac Doppler and Color Doppler (Both Spectral and Color            Flow Doppler were utilized during procedure). Indications:     Abnormal ECG  R94.31                  Elevated troponin  History:         Patient has no prior history of Echocardiogram examinations.                  Risk Factors:Hypertension.  Sonographer:     Christopher Furnace Referring Phys:  [id number] DAVID MANUEL ORTIZ Diagnosing Phys: Deatrice Cage MD IMPRESSIONS  1. Left ventricular ejection fraction, by estimation, is 50 to 55%. The left ventricle has low normal function. The left ventricle has no regional wall motion abnormalities. There is moderate left ventricular hypertrophy. Left ventricular diastolic parameters are indeterminate.  2. Right ventricular systolic function is normal. The right ventricular size is normal. Tricuspid regurgitation signal is inadequate for assessing PA pressure.  3. The mitral valve is normal in structure. Mild to moderate mitral valve regurgitation. No evidence of mitral stenosis.  4. The aortic valve is normal in structure. Aortic valve regurgitation is not visualized. Aortic valve sclerosis/calcification is present, without any evidence of aortic stenosis.  5. The inferior vena cava is normal in size with greater than 50% respiratory variability, suggesting right atrial pressure of 3 mmHg. FINDINGS  Left Ventricle: Left ventricular ejection fraction, by estimation, is 50 to 55%. The left ventricle has low normal function. The left ventricle has no regional wall motion abnormalities. The left ventricular internal cavity size was normal in size. There is moderate left ventricular hypertrophy. Left ventricular diastolic parameters are indeterminate. Right Ventricle: The right ventricular size is normal. No increase in right ventricular wall thickness. Right ventricular systolic function is normal. Tricuspid regurgitation signal  is inadequate for assessing PA pressure. Left Atrium: Left atrial size was normal in size. Right Atrium: Right atrial size was normal in size. Pericardium: There is no evidence of pericardial effusion. Mitral Valve: The mitral valve is  normal in structure. There is mild thickening of the mitral valve leaflet(s). There is mild calcification of the mitral valve leaflet(s). Mild to moderate mitral valve regurgitation. No evidence of mitral valve stenosis.  MV peak gradient, 5.3 mmHg. The mean mitral valve gradient is 3.0 mmHg. Tricuspid Valve: The tricuspid valve is normal in structure. Tricuspid valve regurgitation is mild . No evidence of tricuspid stenosis. Aortic Valve: The aortic valve is normal in structure. Aortic valve regurgitation is not visualized. Aortic valve sclerosis/calcification is present, without any evidence of aortic stenosis. Aortic valve mean gradient measures 6.5 mmHg. Aortic valve peak  gradient measures 10.7 mmHg. Aortic valve area, by VTI measures 2.40 cm. Pulmonic Valve: The pulmonic valve was normal in structure. Pulmonic valve regurgitation is trivial. No evidence of pulmonic stenosis. Aorta: The aortic root is normal in size and structure. Venous: The inferior vena cava is normal in size with greater than 50% respiratory variability, suggesting right atrial pressure of 3 mmHg. IAS/Shunts: No atrial level shunt detected by color flow Doppler.  LEFT VENTRICLE PLAX 2D LVIDd:         4.40 cm   Diastology LVIDs:         3.10 cm   LV e' medial:    6.31 cm/s LV PW:         1.30 cm   LV E/e' medial:  15.5 LV IVS:        1.60 cm   LV e' lateral:   6.20 cm/s LVOT diam:     2.20 cm   LV E/e' lateral: 15.8 LV SV:         67 LV SV Index:   27 LVOT Area:     3.80 cm  RIGHT VENTRICLE RV Basal diam:  3.50 cm RV Mid diam:    2.50 cm RV S prime:     14.40 cm/s TAPSE (M-mode): 2.2 cm LEFT ATRIUM           Index        RIGHT ATRIUM           Index LA diam:      3.80 cm 1.53 cm/m   RA Area:     19.20 cm LA Vol (A2C): 45.9 ml 18.52 ml/m  RA Volume:   54.70 ml  22.07 ml/m LA Vol (A4C): 45.3 ml 18.28 ml/m  AORTIC VALVE AV Area (Vmax):    1.87 cm AV Area (Vmean):   1.77 cm AV Area (VTI):     2.40 cm AV Vmax:           163.50 cm/s AV  Vmean:          115.000 cm/s AV VTI:            0.280 m AV Peak Grad:      10.7 mmHg AV Mean Grad:      6.5 mmHg LVOT Vmax:         80.60 cm/s LVOT Vmean:        53.600 cm/s LVOT VTI:          0.177 m LVOT/AV VTI ratio: 0.63  AORTA Ao Root diam: 3.60 cm MITRAL VALVE               TRICUSPID VALVE MV Area (PHT):  2.37 cm    TR Peak grad:   19.4 mmHg MV Area VTI:   1.97 cm    TR Vmax:        220.00 cm/s MV Peak grad:  5.3 mmHg MV Mean grad:  3.0 mmHg    SHUNTS MV Vmax:       1.15 m/s    Systemic VTI:  0.18 m MV Vmean:      80.4 cm/s   Systemic Diam: 2.20 cm MV Decel Time: 320 msec MV E velocity: 98.00 cm/s MV A velocity: 84.40 cm/s MV E/A ratio:  1.16 Deatrice Cage MD Electronically signed by Deatrice Cage MD Signature Date/Time: 07/30/2023/3:48:48 PM    Final    CT Head Wo Contrast Result Date: 07/29/2023 CLINICAL DATA:  Provided history: Mental status change, unknown cause. EXAM: CT HEAD WITHOUT CONTRAST TECHNIQUE: Contiguous axial images were obtained from the base of the skull through the vertex without intravenous contrast. RADIATION DOSE REDUCTION: This exam was performed according to the departmental dose-optimization program which includes automated exposure control, adjustment of the mA and/or kV according to patient size and/or use of iterative reconstruction technique. COMPARISON:  Head CT 12/28/2020. FINDINGS: Brain: Generalized cerebral atrophy. Small chronic cortically-based infarcts again demonstrated within the medial right frontal lobe and right occipital lobe. Background chronic small vessel ischemic changes within the cerebral white matter and within/about the bilateral basal ganglia, similar to the prior head CT of 12/28/2020. There is no acute intracranial hemorrhage. No acute demarcated cortical infarct. No extra-axial fluid collection. No evidence of an intracranial mass. No midline shift. Vascular: Calcific focus in the region of the left middle cerebral artery M1/M2 junction, new from the  prior head CT of 12/28/2020 (for instance as seen on series 3, image 34). This may reflect calcified atherosclerotic plaque or an age-indeterminate calcified embolus. Atherosclerotic calcifications also present within the intracranial internal carotid and vertebral arteries. Skull: No calvarial fracture or aggressive osseous lesion. Sinuses/Orbits: No mass or acute finding within the imaged orbits. No significant paranasal sinus disease at the imaged levels. Impression #2 called by telephone at the time of interpretation on 07/29/2023 at 12:57 pm to provider [facility] , who verbally acknowledged these results. IMPRESSION: 1. No acute intracranial hemorrhage or evidence of an acute infarct. 2. Calcific focus in the region of the left middle cerebral artery M1/M2 junction, new from the prior head CT of 12/28/2020. This may reflect calcified atherosclerotic plaque or an age-indeterminate calcified embolus. CT angiography may be obtained for further evaluation, as clinically warranted. 3. Background parenchymal atrophy, chronic small vessel ischemic disease and chronic infarcts, as described. Electronically Signed   By: Rockey Childs D.O.   On: 07/29/2023 13:01   CT CHEST ABDOMEN PELVIS W CONTRAST Result Date: 07/29/2023 CLINICAL DATA:  Respiratory distress associated with emesis and diffuse weakness EXAM: CT CHEST, ABDOMEN, AND PELVIS WITH CONTRAST TECHNIQUE: Multidetector CT imaging of the chest, abdomen and pelvis was performed following the standard protocol during bolus administration of intravenous contrast. RADIATION DOSE REDUCTION: This exam was performed according to the departmental dose-optimization program which includes automated exposure control, adjustment of the mA and/or kV according to patient size and/or use of iterative reconstruction technique. CONTRAST:  OMNIPAQUE IOHEXOL 300 MG/ML  SOLN COMPARISON:  Same day chest radiograph, CT abdomen and pelvis dated 12/26/2020 FINDINGS: CT CHEST  FINDINGS Cardiovascular: Normal heart size. No significant pericardial fluid/thickening. Great vessels are normal in course and caliber. No central pulmonary emboli. Coronary artery calcifications. Mediastinum/Nodes: Imaged thyroid  gland without nodules meeting criteria for imaging follow-up by size. Normal esophagus. 10 mm subcarinal lymph node (2:51). Lungs/Pleura: The central airways are patent. Confluent patchy ground-glass opacities within the bilateral upper, dependent right middle, and right greater than left lower lobes. No focal consolidation. No pneumothorax. No pleural effusion. Musculoskeletal: Postsurgical changes of the cervical fusion at C4-C7. Grade 1 anterolisthesis at C4-5. No acute or abnormal lytic or blastic osseous lesions. Multilevel degenerative changes of the thoracic spine. CT ABDOMEN PELVIS FINDINGS Hepatobiliary: No focal hepatic lesions. No intra or extrahepatic biliary ductal dilation. Normal gallbladder. Pancreas: No focal lesions or main ductal dilation. Spleen: Normal in size without focal abnormality. Adrenals/Urinary Tract: No adrenal nodules. Minimally complicated 2.7 cm right interpolar cyst. No specific follow-up imaging recommended. No hydronephrosis. Punctate bilateral nonobstructing stones. Mildly trabeculated urinary bladder. Stomach/Bowel: Normal appearance of the stomach. No evidence of bowel wall thickening, distention, or inflammatory changes. Small to moderate volume stool throughout the colon. Colonic diverticulosis without acute diverticulitis. Normal appendix. Vascular/Lymphatic: Aortic atherosclerosis. No enlarged abdominal or pelvic lymph nodes. Reproductive: Prostate is unremarkable. Other: Trace presacral free fluid.  No free air or fluid collection. Musculoskeletal: No acute or abnormal lytic or blastic osseous findings. Postsurgical changes of L3-S1 spinal fusion with bi iliac screws. Hardware appears intact. Small fat-containing left inguinal hernia. Mild body  wall edema overlying the left flank and hips bilaterally. IMPRESSION: 1. Findings of multifocal pneumonia. 2. No acute intra-abdominal or intrapelvic abnormality. 3. Punctate bilateral nonobstructing renal stones. 4. Colonic diverticulosis without acute diverticulitis. 5. Aortic Atherosclerosis (ICD10-I70.0). Coronary artery calcifications. Assessment for potential risk factor modification, dietary therapy or pharmacologic therapy may be warranted, if clinically indicated. Electronically Signed   By: Limin  Xu M.D.   On: 07/29/2023 12:46        Scheduled Meds:  aspirin  EC  81 mg Oral Daily   atorvastatin   80 mg Oral QHS   calcium  carbonate  1 tablet Oral BID WC   Chlorhexidine  Gluconate Cloth  6 each Topical Daily   cholecalciferol  2,000 Units Oral Daily   dexamethasone  10 mg Oral Daily   ferrous sulfate  325 mg Oral BID WC   FLUoxetine   40 mg Oral Daily   fluticasone  1 spray Each Nare Daily   gabapentin   1,200 mg Oral QHS   lamoTRIgine   150 mg Oral QHS   magnesium oxide  400 mg Oral Daily   montelukast   10 mg Oral Daily   olopatadine   1 drop Both Eyes BID   pneumococcal 20-valent conjugate vaccine  0.5 mL Intramuscular Tomorrow-1000   rOPINIRole   2 mg Oral QHS   warfarin  3.75 mg Oral ONCE-1600   Warfarin - Pharmacist Dosing Inpatient   Does not apply q1600   Continuous Infusions:  sodium chloride  10 mL/hr at 07/31/23 0700   ampicillin -sulbactam (UNASYN ) IV Stopped (07/31/23 9360)   azithromycin Stopped (07/30/23 1640)     LOS: 2 days     Devaughn KATHEE Ban, MD [facility]   If 7PM-7AM, please contact night-coverage [email or web address] Password [email or web address] 07/31/2023, 12:09 PM

## 2023-07-31 NOTE — Progress Notes (Signed)
 Speech Language Pathology Treatment: Dysphagia  Patient Details Name: Kenneth Summers MRN: [id number] DOB: 14-Apr-1945 Today's Date: 07/31/2023 Time: 8769-8679 SLP Time Calculation (min) (ACUTE ONLY): 50 min  Assessment / Plan / Recommendation Clinical Impression  Pt seen for ongoing assessment of swallowing and toleration of current Dysphagia level 2 diet at lunch meal today. few words and verbal engagement when asked direct questions. Pt required Full support w/ upright sitting for oral intake; mild R lean w/ pillows supporting him. Pt has some missing dentition. He required Full feeding support. On [place] O2 2L; afebrile today. WBC WNL. Noted slight phlegm w/ cough Prior to po's given. Pt is admitted w/ dx'd COVID this admit. He appears generalized weakness w/ minimal engagement at times when not given MOD cues. NSG reported increased drowsiness this morning- unable to give Meds/meal.   Pt appears to present w/ suspected oropharyngeal phase swallowing challenges/dysphagia though No immediate, overt clinical s/s of aspiration were noted w/ po trials he consumed this session. Pt presented w/ generalized oral weakness and slow oral phase bolus management and clearing. Mild throat clearing occurred b/t trials but not directly following po's; no decline in respiratory presentation during oral intake. O2 sats remained 100%; vocal quality clear (unsure if throat clearing related to Phlegm, or slight+ diffuse oral residue b/t trials). OF NOTE: Pt has Baseline Esophageal phase Dysmotility- ANY Esophageal phase Dysmotility can impact the oropharyngeal phases of swallowing and increase risk for REFLUX aspiration also.   Pt appears at risk for aspiration/aspiration pneumonia but this risk can be reduced when following general aspiration precautions w/ a dysphagia diet and Full feeding Supervision w/ oral intake. The Baseline challenging factors of fatigue/weakness/deconditioning, drowsy State, dependency of being  fed, Esophageal phase Dysmotility, and Chronic illness w/ suspected cognitive impact can all impact oropharyngeal phase swallowing and oral intake in general.   During po trials, pt consumed all consistencies w/ no immediate, overt coughing, decline in vocal quality, or change in respiratory presentation during/post trials. The 1-2x mild throat clear was not immediate to the swallow nor repeated as trials continued. Oral phase appeared min slow w/ bolus management and control of bolus propulsion for A-P transfer for swallowing. Oral clearing was not achieved w/ initial swallow resulting in slight-min diffuse oral residue w/ food trials. This was cleared w/ f/u swallow and alternating food/liquid. Noted slight labial leakage on R side post pudding/ice cream trials- pt was leaning to his R side(suspect gravity and reduced oral tone d/t generalized weakness) as reason for leakage.    Recommend a downgrade to Pureed foods(dysphagia level 1) for ease of oral phase management and hopefully to increase oral intake/nutrition; Thin liquids -- carefully monitor straw use and pinch for SMALL sips SLOWLY. Recommend general aspiration precautions, 100% Feeding Supervision/Support, reduce distractions and give rest breaks as needed. Pills CRUSHED in Puree for safer, easier swallowing at this time; it was encouraged now and for D/C. REFLUX precautions.  Education given on Pills in Puree; food consistencies and options; aspiration and REFLUX precautions to pt and NSG. ST services will monitor pt's status next 1-2 days for further needs. NSG updated, agreed. MD updated. Recommend Dietician and Palliative Care f/u for support.         HPI HPI: Per MD Progress Note, Kenneth Summers is a 78 y.o. male with medical history significant of actinic keratosis, alcohol abuse(ETOH), antiphospholipid syndrome, basal cell carcinoma, BPH, CAD, cellulitis, chronic osteomyelitis, cervical spinal stenosis, CKD, C. difficile diarrhea,  depression, diplopia, fatigue, GERD, hyperlipidemia,  hypertension, vitamin D deficiency, IBS, IgA deficiency, insomnia, left lumbosacral radiculopathy, moderate obstructive sleep apnea, osteomyelitis of the foot, PAD, rheumatoid arthritis, restless leg syndrome, systemic lupus erythematosus, history of CVA who was brought from home due to possible sepsis and respiratory distress. EMS described that initially he was on 80% on room air and subsequently was placed on NRB at 15 L/min with his O2 sat increasing to normal levels. He has also been having generalized weakness and had several episodes of emesis.  He is still confused, but able to answer some simple questions.  He is not having headache, chest or abdominal pain at this time..  CXR 07/31/23: similar to admit w/ Vascular congestion with bilateral interstitial densities  concerning for pneumonia.  2. Small bilateral pleural effusions..   OF NOTE: MRI at admit- Moderate diffuse cerebral and cerebellar volume loss  present. There are chronic focal encephalomalacia changes within the  right occipital lobe. Deep white matter diisease.   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     Mild narrowing just above the GE junction measuring 1.0 cm at  maximum distension, distal esophageal stricture cannot be excluded.      SLP Plan  Continue with current plan of care          Recommendations  Diet recommendations: Dysphagia 1 (puree);Thin liquid (gravies) Liquids provided via: Cup;Straw (monitor) Medication Administration: Crushed with puree Supervision: Staff to assist with self feeding;Full supervision/cueing for compensatory strategies Compensations: Minimize environmental distractions;Slow rate;Small sips/bites;Lingual sweep for clearance of pocketing;Multiple dry swallows after each bite/sip;Follow solids with liquid Postural Changes and/or Swallow Maneuvers: Out of bed for  meals;Seated upright 90 degrees;Upright 30-60 min after meal                 (Palliative Care following; Dietician) Oral care BID;Oral care before and after PO;Staff/trained caregiver to provide oral care   Frequent or constant Supervision/Assistance Dysphagia, oropharyngeal phase (R13.12);Dysphagia, unspecified (R13.10) (Esophageal phase Dysmotility- see prior EGD results; suspect degree of Cognitive impairment; dependency on being fed)     Continue with current plan of care      Comer Portugal, MS, CCC-SLP Speech Language Pathologist Rehab Services; [facility] [phone] (ascom) Tyeshia Cornforth  07/31/2023, 4:09 PM

## 2023-07-31 NOTE — Progress Notes (Signed)
   07/31/23 1500  Spiritual Encounters  Type of Visit Follow up  Care provided to: Patient  Referral source Chaplain assessment  Reason for visit Routine spiritual support  OnCall Visit No  Spiritual Framework  Presenting Themes Meaning/purpose/sources of inspiration;Values and beliefs;Coping tools;Courage hope and growth  Interventions  Spiritual Care Interventions Made Established relationship of care and support;Compassionate presence;Prayer;Encouragement  Intervention Outcomes  Outcomes Connection to spiritual care;Awareness of support;Reduced isolation

## 2023-07-31 NOTE — Progress Notes (Signed)
   07/31/23 1100  Spiritual Encounters  Type of Visit Follow up  Care provided to: Family (Daughter is at bedside and is his and her mother's caretaker)  Referral source Chaplain assessment  Reason for visit Routine spiritual support  OnCall Visit No  Spiritual Framework  Presenting Themes Meaning/purpose/sources of inspiration;Values and beliefs;Caregiving needs;Coping tools;Other (comment) (For Daughter)  Family Stress Factors Other (Comment) (Concerns over future care and what that looks like)  Interventions  Spiritual Care Interventions Made Established relationship of care and support;Compassionate presence;Reflective listening;Explored values/beliefs/practices/strengths;Encouragement;Other (comment) (for Daughter)  Intervention Outcomes  Outcomes Connection to spiritual care;Awareness of support;Patient family open to resources

## 2023-07-31 NOTE — Progress Notes (Signed)
 Daily Progress Note   Patient Name: Kenneth Summers       Date: 07/31/2023 DOB: [birth year]  Age: 78 y.o. MRN#: [id number] Attending Physician: Kandis Devaughn Sayres, MD Primary Care Physician: Steva Clotilda DEL, NP Admit Date: 07/29/2023  Reason for Consultation/Follow-up: Establishing goals of care  Subjective: Notes reviewed.  Up to see patient.  He is currently resting in bed with radiology at bedside.  His daughter standing out in the hall and is very tearful.  She discusses that her father is not eating and drinking today and is not really speaking.  She discusses the updates that she has been provided by the hospitalist and by [facility] staff.  She states she does not want her father to suffer and wants to make sure that she is making the correct decisions.  With conversation, she has decided to go get her mother so that she can come and visit patient.  She states she will call her brother who lives in [place]  to update him and give him the ability to come to bedside should he choose to do so.  Discussed giving it another day or 2 to see how patient does and then make decisions from there.     Length of Stay: 2  Current Medications: Scheduled Meds:   aspirin  EC  81 mg Oral Daily   atorvastatin   80 mg Oral QHS   calcium  carbonate  1 tablet Oral BID WC   Chlorhexidine  Gluconate Cloth  6 each Topical Daily   cholecalciferol  2,000 Units Oral Daily   dexamethasone  10 mg Oral Daily   ferrous sulfate  325 mg Oral BID WC   FLUoxetine   40 mg Oral Daily   fluticasone  1 spray Each Nare Daily   gabapentin   1,200 mg Oral QHS   lamoTRIgine   150 mg Oral QHS   magnesium oxide  400 mg Oral Daily   montelukast   10 mg Oral Daily   olopatadine   1 drop Both Eyes BID   pneumococcal 20-valent conjugate  vaccine  0.5 mL Intramuscular Tomorrow-1000   rOPINIRole   2 mg Oral QHS   warfarin  3.75 mg Oral ONCE-1600   Warfarin - Pharmacist Dosing Inpatient   Does not apply q1600    Continuous Infusions:  sodium chloride  10 mL/hr at 07/31/23 0700   ampicillin -sulbactam (  UNASYN ) IV 3 g (07/31/23 1245)   azithromycin Stopped (07/30/23 1640)    PRN Meds: sodium chloride , acetaminophen  **OR** acetaminophen , albuterol , diphenoxylate-atropine, ondansetron  **OR** ondansetron  (ZOFRAN ) IV, mouth rinse  Physical Exam Constitutional:      Comments: Opens eyes but is not talking  Pulmonary:     Effort: Pulmonary effort is normal.   Neurological:     Mental Status: He is alert.             Vital Signs: BP (!) 140/100 (BP Location: Left Arm)   Pulse 73   Temp 97.8 F (36.6 C) (Axillary)   Resp 18   Ht 6' 5 (1.956 m)   Wt 120.5 kg   SpO2 100%   BMI 31.50 kg/m  SpO2: SpO2: 100 % O2 Device: O2 Device: Nasal Cannula O2 Flow Rate: O2 Flow Rate (L/min): 2 L/min  Intake/output summary:  Intake/Output Summary (Last 24 hours) at 07/31/2023 1450 Last data filed at 07/31/2023 0700 Gross per 24 hour  Intake 600.41 ml  Output 1600 ml  Net -999.59 ml   LBM: Last BM Date : 07/30/23 Baseline Weight: Weight: 120.8 kg Most recent weight: Weight: 120.5 kg         Patient Active Problem List   Diagnosis Date Noted   Pneumonia due to COVID-19 virus 07/31/2023   Sepsis due to pneumonia (HCC) 07/29/2023   Elevated troponin 07/29/2023   Acute respiratory failure with hypoxia (HCC) 07/29/2023   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) 10/02/2022   Pressure injury of skin 12/28/2020   Lethargy    Diarrhea    Seizure disorder (HCC)    Acute colitis 12/26/2020   Severe sepsis (HCC) 12/26/2020   APS (antiphospholipid syndrome) (HCC)    Cough    Acute renal failure superimposed on stage 3a chronic kidney disease (HCC)    Stroke (HCC)    Irritant  contact dermatitis due to fecal, urinary or dual incontinence 10/21/2020   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/01/2020   Radiculopathy, lumbosacral region 09/07/2020   Unspecified urinary incontinence 09/07/2020   Pressure injury of right buttock, stage 2 (HCC) 06/16/2020   Sepsis (HCC) 10/18/2018   Long term (current) use of anticoagulants 05/01/2018   History of stroke 04/11/2018   Cerebrovascular accident (CVA) due to occlusion of right posterior cerebral artery (HCC) 10/24/2017   Hyperlipidemia 07/04/2017   Chronic diarrhea 06/12/2017   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) 04/27/2016   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   Visual changes 06/19/2013   Anterolisthesis 01/16/2013   Cervical spinal stenosis 01/16/2013   Weakness 01/15/2013   Benign prostatic hyperplasia 01/07/2013   Depression 01/07/2013   Restless legs syndrome 01/07/2013   Vitamin D deficiency 07/26/2012   Chronic kidney disease, stage 3 unspecified (HCC) 12/20/2011   CAD (coronary artery disease) 11/03/2010   Essential hypertension 11/03/2010   PAD (peripheral artery disease) (HCC) 11/03/2010   SLE (systemic lupus erythematosus) (HCC) 11/03/2010    Palliative Care Assessment & Plan     Recommendations/Plan: Time for outcomes   Code Status:    Code Status Orders  (From admission, onward)           Start     Ordered   07/29/23 1554  Do not attempt resuscitation (DNR)- Limited -Do Not Intubate (DNI)  (Code Status)  Continuous       Question Answer Comment  If pulseless and not breathing No CPR or chest compressions.  In Pre-Arrest Conditions (Patient Is Breathing and Has A Pulse) Do not intubate. Provide all appropriate non-invasive medical interventions. Avoid ICU transfer unless indicated or required.   Consent: Discussion documented in EHR or advanced directives reviewed      07/29/23 1553           Code Status History     Date  Active Date Inactive Code Status Order ID Comments User Context   07/29/2023 1552 07/29/2023 1553 Full Code [id number]  Celinda Alm Lot, MD ED   12/26/2020 1601 12/30/2020 1631 DNR [id number]  Hilma Rankins, MD ED   12/26/2020 1502 12/26/2020 1601 Full Code [id number]  Hilma Rankins, MD ED   10/18/2018 1549 10/21/2018 1755 DNR [id number]  Runell Redia PARAS, MD ED   10/18/2018 1548 10/18/2018 1549 Full Code [id number]  Runell Redia PARAS, MD ED       Thank you for allowing the Palliative Medicine Team to assist in the care of this patient.    Camelia Lewis, NP  Please contact Palliative Medicine Team phone at [phone] for questions and concerns.

## 2023-08-01 DIAGNOSIS — Z7189 Other specified counseling: Secondary | ICD-10-CM | POA: Diagnosis not present

## 2023-08-01 DIAGNOSIS — J1282 Pneumonia due to coronavirus disease 2019: Secondary | ICD-10-CM | POA: Diagnosis not present

## 2023-08-01 DIAGNOSIS — U071 COVID-19: Secondary | ICD-10-CM | POA: Diagnosis not present

## 2023-08-01 LAB — BASIC METABOLIC PANEL WITH GFR
Anion gap: 6 (ref 5–15)
BUN: 28 mg/dL — ABNORMAL HIGH (ref 8–23)
CO2: 28 mmol/L (ref 22–32)
Calcium: 8.4 mg/dL — ABNORMAL LOW (ref 8.9–10.3)
Chloride: 104 mmol/L (ref 98–111)
Creatinine, Ser: 0.84 mg/dL (ref 0.61–1.24)
GFR, Estimated: >60 mL/min (ref >60)
Glucose, Bld: 146 mg/dL — ABNORMAL HIGH (ref 70–99)
Potassium: 4.5 mmol/L (ref 3.5–5.1)
Sodium: 138 mmol/L (ref 135–145)

## 2023-08-01 LAB — PROTIME-INR
INR: 3.7 — ABNORMAL HIGH (ref 0.8–1.2)
Prothrombin Time: 37.1 s — ABNORMAL HIGH (ref 11.4–15.2)

## 2023-08-01 LAB — CBC
HCT: 37.8 % — ABNORMAL LOW (ref 39.0–52.0)
Hemoglobin: 12.2 g/dL — ABNORMAL LOW (ref 13.0–17.0)
MCH: 31.2 pg (ref 26.0–34.0)
MCHC: 32.3 g/dL (ref 30.0–36.0)
MCV: 96.7 fL (ref 80.0–100.0)
Platelets: 145 10*3/uL — ABNORMAL LOW (ref 150–400)
RBC: 3.91 MIL/uL — ABNORMAL LOW (ref 4.22–5.81)
RDW: 13.7 % (ref 11.5–15.5)
WBC: 9 10*3/uL (ref 4.0–10.5)
nRBC: 0 % (ref 0.0–0.2)

## 2023-08-01 MED ORDER — GLYCOPYRROLATE 1 MG PO TABS: 1.0000 mg | ORAL_TABLET | ORAL | Status: DC | PRN

## 2023-08-01 MED ORDER — ASPIRIN 81 MG PO CHEW
81.0000 mg | CHEWABLE_TABLET | Freq: Every day | ORAL | Status: DC
  Administered 2023-08-01: 81 mg via ORAL
  Filled 2023-08-01: qty 1

## 2023-08-01 MED ORDER — SODIUM CHLORIDE 0.9% FLUSH
3.0000 mL | Freq: Two times a day (BID) | INTRAVENOUS | Status: DC
  Administered 2023-08-01: 10 mL via INTRAVENOUS
  Administered 2023-08-02: 3 mL via INTRAVENOUS

## 2023-08-01 MED ORDER — DM-GUAIFENESIN ER 30-600 MG PO TB12
1.0000 | ORAL_TABLET | Freq: Two times a day (BID) | ORAL | Status: DC
  Administered 2023-08-01 (×2): 1 via ORAL
  Filled 2023-08-01 (×3): qty 1

## 2023-08-01 MED ORDER — MORPHINE SULFATE (PF) 2 MG/ML IV SOLN
2.0000 mg | INTRAVENOUS | Status: DC | PRN
  Administered 2023-08-01 – 2023-08-02 (×3): 2 mg via INTRAVENOUS
  Administered 2023-08-02: 4 mg via INTRAVENOUS
  Filled 2023-08-01: qty 1
  Filled 2023-08-01 (×3): qty 2

## 2023-08-01 MED ORDER — ENSURE PLUS HIGH PROTEIN PO LIQD: 237.0000 mL | Freq: Two times a day (BID) | ORAL | Status: DC

## 2023-08-01 MED ORDER — GLYCOPYRROLATE 0.2 MG/ML IJ SOLN
0.2000 mg | INTRAMUSCULAR | Status: DC | PRN
  Administered 2023-08-01 – 2023-08-02 (×3): 0.2 mg via INTRAVENOUS
  Filled 2023-08-01 (×3): qty 1

## 2023-08-01 MED ORDER — GLYCOPYRROLATE 0.2 MG/ML IJ SOLN
0.2000 mg | INTRAMUSCULAR | Status: DC | PRN
  Filled 2023-08-01: qty 1

## 2023-08-01 MED ORDER — POLYVINYL ALCOHOL 1.4 % OP SOLN
1.0000 [drp] | Freq: Four times a day (QID) | OPHTHALMIC | Status: DC | PRN
Start: 2023-08-01 — End: 2023-08-02

## 2023-08-01 MED ORDER — SODIUM CHLORIDE 0.9% FLUSH: 3.0000 mL | INTRAVENOUS | Status: DC | PRN

## 2023-08-01 MED ORDER — HALOPERIDOL LACTATE 5 MG/ML IJ SOLN: 2.5000 mg | INTRAMUSCULAR | Status: DC | PRN

## 2023-08-01 MED ORDER — HYDROCODONE-ACETAMINOPHEN 5-325 MG PO TABS
1.0000 | ORAL_TABLET | Freq: Four times a day (QID) | ORAL | Status: DC | PRN
  Administered 2023-08-01: 1 via ORAL
  Filled 2023-08-01: qty 1

## 2023-08-01 NOTE — Plan of Care (Signed)
  Problem: Clinical Measurements: Goal: Respiratory complications will improve Outcome: Progressing   Problem: Nutrition: Goal: Adequate nutrition will be maintained Outcome: Progressing   Problem: Coping: Goal: Level of anxiety will decrease Outcome: Progressing   Problem: Elimination: Goal: Will not experience complications related to bowel motility Outcome: Progressing Goal: Will not experience complications related to urinary retention Outcome: Progressing   Problem: Pain Managment: Goal: General experience of comfort will improve and/or be controlled Outcome: Progressing   Problem: Safety: Goal: Ability to remain free from injury will improve Outcome: Progressing   Problem: Coping: Goal: Psychosocial and spiritual needs will be supported Outcome: Progressing

## 2023-08-01 NOTE — Consult Note (Signed)
 Pharmacy Consult Note - Anticoagulation  Pharmacy Consult for warfarin Indication: APS  PATIENT MEASUREMENTS: Height: 6' 5 (195.6 cm) Weight: 120.5 kg (265 lb 10.5 oz) IBW/kg (Calculated) : 89.1 HEPARIN DW (KG): 114.2  VITAL SIGNS: Temp: 97.5 F (36.4 C) (06/24 2118) BP: 162/98 (06/25 0611) Pulse Rate: 74 (06/25 0611)  Recent Labs    07/30/23 1111 07/31/23 0320 08/01/23 0444  HGB  --    < > 12.2*  HCT  --    < > 37.8*  PLT  --    < > 145*  LABPROT  --    < > 37.1*  INR  --    < > 3.7*  CREATININE  --    < > 0.84  TROPONINIHS 156*  --   --    < > = values in this interval not displayed.    Estimated Creatinine Clearance: 104.3 mL/min (by C-G formula based on SCr of 0.84 mg/dL).  PAST MEDICAL HISTORY: Past Medical History:  Diagnosis Date   Actinic keratosis    Alcohol abuse    APS (antiphospholipid syndrome) (HCC)    Basal cell carcinoma    BPH (benign prostatic hyperplasia)    CAD (coronary artery disease)    Cellulitis    Cervical spinal stenosis    Chronic osteomyelitis (HCC)    CKD (chronic kidney disease)    CKD (chronic kidney disease)    Clostridium difficile diarrhea    Depression    Diplopia    Fatigue    GERD (gastroesophageal reflux disease)    Hyperlipemia    Hypertension    Hypovitaminosis D    IBS (irritable bowel syndrome)    IgA deficiency (HCC)    Insomnia    Left lumbosacral radiculopathy    Moderate obstructive sleep apnea    Osteomyelitis of foot (HCC)    PAD (peripheral artery disease) (HCC)    Pruritus    RA (rheumatoid arthritis) (HCC)    Radiculopathy    Restless legs syndrome    RLS (restless legs syndrome)    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 male with PMH including antiphospholipid syndrome on warfarin, HTN, stroke, CAD is presenting with sepsis  secondary to pneumonia. Patient is on chronic anticoagulation using warfarin per chart review. Last dose prior to admission was yesterday, 6/21. INR is currently therapeutic. Pharmacy has been consulted to manage warfarin while patient is admitted.  Pertinent medications: Warfarin 3.75mg  PO daily Patient has 2.5mg  tablets at home   drug-drug interactions: -Azithromycin >> increased risk of bleeding -dexamethasone (steroids)  Goal(s) of therapy: INR 2 - 3 Monitor platelets by anticoagulation protocol: Yes   Baseline anticoagulation labs: Recent Labs    07/30/23 0428 07/31/23 0320 08/01/23 0444  INR 2.2* 2.5* 3.7*  HGB 12.9* 12.5* 12.2*  PLT 137* 122* 145*    Date INR Warfarin Dose 6/22 2.3 3.75 mg 6/23 2.2 3.75 mg 6/24  2.5 3.75 mg 6/25 3.7 HOLD  PLAN: INR Supratherapeutic.  Will hold warfarin today.  Likely result of DDI w/ Azithromycin and dexamethasone Daily INR CBC at least every 3 days while on warfarin  Khani Paino PharmD Clinical Pharmacist 08/01/2023

## 2023-08-01 NOTE — Progress Notes (Signed)
 PROGRESS NOTE    Kenneth Summers  FMW:[id number] DOB: 08/29/1945 DOA: 07/29/2023 PCP: Steva Clotilda DEL, NP  Chief Complaint  Patient presents with   Code Sepsis    Hospital Course:  Kenneth Summers is a 78 year old male with alcohol abuse, antiphospholipid syndrome, history of basal cell carcinoma, BPH, CAD, chronic osteomyelitis, cervical spinal stenosis, CKD, C. difficile diarrhea, depression, GERD, hyperlipidemia, hypertension, IBS, IgA deficiency, insomnia, lumbosacral radiculopathy, OSA, osteomyelitis of the foot, PAD, rheumatoid arthritis, restless leg syndrome, SLE, history of CVA, who was brought in from home with sepsis and respiratory distress.  He was initially 80% on room air and subsequently placed on a nonrebreather. Patient tested positive for COVID-19.  Multifocal pneumonia seen on CT.  There is also concern of aspiration pneumonia as patient was found with vomit on his pillow at home.  She was started on Unasyn , Decadron.  He was seen by SLP who cleared him for a dysphagia 2 diet. Patient was found to be encephalopathic beyond his baseline.  At baseline he is left-sided hemiparesis but is communicative.  CT head and brain MRI without acute findings.,  Upon further discussion with palliative care patient's daughter, [name], reports that his quality of life is poor and has been declining.  On 6/25 she, and her mother, elected to make the patient comfort measures only.  Subjective: Extensive discussion with the patient's daughter Clarita, at bedside today.  Palliative care also present for this discussion.  The patient's daughter, Clarita plans to discuss comfort measures with her mother later today.   Upon follow-up conversation later in the evening Clarita would like to pursue comfort measures only.  She reports that she has discussed this extensively with her mom and they both agree.  They will likely desire home hospice but would like to discuss more details in the  morning.   Objective: Vitals:   08/01/23 0611 08/01/23 0754 08/01/23 1208 08/01/23 1620  BP: (!) 162/98 (!) 157/95 (!) 154/89 (!) 160/86  Pulse: 74 67 68 71  Resp:  17 17 16   Temp:  97.6 F (36.4 C) 98.3 F (36.8 C) 98.2 F (36.8 C)  TempSrc:      SpO2: 91% 95% 95% 93%  Weight:      Height:        Intake/Output Summary (Last 24 hours) at 08/01/2023 1722 Last data filed at 08/01/2023 9388 Gross per 24 hour  Intake 553.83 ml  Output 600 ml  Net -46.17 ml   Filed Weights   07/29/23 1028 07/29/23 1614 07/31/23 0400  Weight: 120.8 kg 115.5 kg 120.5 kg    Examination: General exam: Appears calm and comfortable, NAD  Respiratory system: No work of breathing, upper airway rattle and Rales Cardiovascular system: S1 & S2 heard, RRR.  Gastrointestinal system: Abdomen is nondistended, soft and nontender.  Neuro: Drowsy but arousable, does not answer questions consistently or follow commands.  Does require significant prompting. Psychiatry: Calm, otherwise difficult to assess  Assessment & Plan:  Principal Problem:   Pneumonia due to COVID-19 virus Active Problems:   CAD (coronary artery disease)   Benign prostatic hyperplasia   Chronic kidney disease, stage 3 unspecified (HCC)   Depression   Hyperlipidemia   Essential hypertension   History of stroke   Long term (current) use of anticoagulants   PAD (peripheral artery disease) (HCC)   SLE (systemic lupus erythematosus) (HCC)   Cerebrovascular accident (CVA) due to occlusion of right posterior cerebral artery (HCC)   APS (antiphospholipid syndrome) (HCC)  Seizure disorder (HCC)   Pressure injury of skin   Sepsis due to pneumonia (HCC)   Elevated troponin   Acute respiratory failure with hypoxia (HCC)    Comfort measures only - Patient has suffered considerably since his CVA 6 years ago.  He is currently bedbound and uses a Nurse, adult for transfers.  Quality of life has been gradually declining.  In light of recent  infection, the patient's daughter, [name]) and the patient's wife have elected to make the patient comfort measures only. - We will discontinue all medications that do not immediately provide the patient comfort.  Discontinue all lab draws.  Discontinue all bed alarms. - q shift follows only - Palliative care consulted, will discuss with hospice liaison in the morning for outpatient arrangements  Acute hypoxic respiratory failure - Initially on 15 L, has now weaned to 2 L, continue weaning as tolerated - Morphine  for air hunger  COVID-pneumonia Aspiration pneumonia - CT scan with multifocal pneumonia.  Concern for possible aspiration event given vomitus seen on pillow prior to arrival - Was on Unasyn , have now discontinued in light of comfort measures only - Will continue with Decadron  Sepsis - Criteria: Elevated lactic acid, febrile, hypoxia, source pneumonia  Aspiration - SLP has cleared patient for dysphagia to diet  Elevated troponins - No chest pain or ischemic changes.  Likely demand given sepsis and hypoxia as above - No regional wall motion abnormalities on TTE  History of CVA Acute metabolic encephalopathy - At baseline has left-sided hemiparesis, mostly bedbound.  On arrival patient was found staring blankly and not responding for several hours, unable to move right side.  This has resolved some.  More alert than prior days.  Head CT and brain MRI without acute intracranial process - Will discontinue aspirin  and statin in light of CMO  Seizure disorder - Continue home dose Lamictal   Hypertension - Stable without meds at this time  Antiphospholipid syndrome - INR therapeutic - Coumadin  per pharmacy  SLE - Decadron as above  Restless leg - Continue home Requip  and gabapentin   MDD - Continue Prozac   Neuropathy - Continue gabapentin    DVT prophylaxis: n/a   Code Status: Limited: Do not attempt resuscitation (DNR) -DNR-LIMITED -Do Not Intubate/DNI   Disposition:  CMO. Pending outpatient hospice arrangements now.  Consultants:    Procedures:    Antimicrobials:  Anti-infectives (From admission, onward)    Start     Dose/Rate Route Frequency Ordered Stop   07/30/23 1200  Ampicillin -Sulbactam (UNASYN ) 3 g in sodium chloride  0.9 % 100 mL IVPB        3 g 200 mL/hr over 30 Minutes Intravenous Every 6 hours 07/30/23 1056     07/30/23 1130  metroNIDAZOLE  (FLAGYL ) IVPB 500 mg  Status:  Discontinued        500 mg 100 mL/hr over 60 Minutes Intravenous Every 12 hours 07/30/23 1030 07/30/23 1056   07/29/23 1600  cefTRIAXone (ROCEPHIN) 2 g in sodium chloride  0.9 % 100 mL IVPB  Status:  Discontinued        2 g 200 mL/hr over 30 Minutes Intravenous Every 24 hours 07/29/23 1536 07/30/23 1056   07/29/23 1600  azithromycin (ZITHROMAX) 500 mg in sodium chloride  0.9 % 250 mL IVPB        500 mg 250 mL/hr over 60 Minutes Intravenous Every 24 hours 07/29/23 1536 08/03/23 1559   07/29/23 1045  ceFEPIme  (MAXIPIME ) 2 g in sodium chloride  0.9 % 100 mL IVPB  2 g 200 mL/hr over 30 Minutes Intravenous  Once 07/29/23 1032 07/29/23 1132   07/29/23 1045  metroNIDAZOLE  (FLAGYL ) IVPB 500 mg        500 mg 100 mL/hr over 60 Minutes Intravenous  Once 07/29/23 1032 07/29/23 1259   07/29/23 1045  vancomycin  (VANCOCIN ) IVPB 1000 mg/200 mL premix        1,000 mg 200 mL/hr over 60 Minutes Intravenous  Once 07/29/23 1032 07/29/23 1456       Data Reviewed: I have personally reviewed following labs and imaging studies CBC: Recent Labs  Lab 07/29/23 1034 07/30/23 0428 07/31/23 0320 08/01/23 0444  WBC 11.6* 10.6* 9.9 9.0  NEUTROABS 6.2 5.0  --   --   HGB 14.6 12.9* 12.5* 12.2*  HCT 44.7 39.5 38.7* 37.8*  MCV 96.5 97.3 97.0 96.7  PLT 171 137* 122* 145*   Basic Metabolic Panel: Recent Labs  Lab 07/29/23 1034 07/30/23 0428 07/31/23 0320 08/01/23 0444  NA 135 134* 135 138  K 4.1 4.5 4.5 4.5  CL 98 102 103 104  CO2 28 27 27 28   GLUCOSE 102* 74  128* 146*  BUN 24* 27* 23 28*  CREATININE 1.14 1.20 0.85 0.84  CALCIUM  8.4* 8.0* 7.9* 8.4*  MG 1.8  --   --   --    GFR: Estimated Creatinine Clearance: 104.3 mL/min (by C-G formula based on SCr of 0.84 mg/dL). Liver Function Tests: Recent Labs  Lab 07/29/23 1034 07/30/23 0428  AST 47* 45*  ALT 30 26  ALKPHOS 67 57  BILITOT 1.4* 1.3*  PROT 7.2 5.8*  ALBUMIN 3.2* 2.6*   CBG: Recent Labs  Lab 07/29/23 1615  GLUCAP 86    Recent Results (from the past 240 hours)  Culture, blood (Routine x 2)     Status: None (Preliminary result)   Collection Time: 07/29/23 10:34 AM   Specimen: BLOOD LEFT ARM  Result Value Ref Range Status   Specimen Description BLOOD LEFT ARM  Final   Special Requests   Final    BOTTLES DRAWN AEROBIC AND ANAEROBIC Blood Culture results may not be optimal due to an inadequate volume of blood received in culture bottles   Culture   Final    NO GROWTH 3 DAYS Performed at [facility], [address] [place], [place] [address]    Report Status PENDING  Incomplete  Culture, blood (Routine x 2)     Status: None (Preliminary result)   Collection Time: 07/29/23 10:34 AM   Specimen: BLOOD  Result Value Ref Range Status   Specimen Description BLOOD LEFT ANTECUBITAL  Final   Special Requests   Final    BOTTLES DRAWN AEROBIC AND ANAEROBIC Blood Culture results may not be optimal due to an inadequate volume of blood received in culture bottles   Culture   Final    NO GROWTH 3 DAYS Performed at [facility], [address] [place], [place] [address]    Report Status PENDING  Incomplete  MRSA Next Gen by PCR, Nasal     Status: None   Collection Time: 07/29/23  4:13 PM   Specimen: Nasal Mucosa; Nasal Swab  Result Value Ref Range Status   MRSA by PCR Next Gen NOT DETECTED NOT DETECTED Final    Comment: (NOTE) The GeneXpert MRSA Assay (FDA approved for NASAL specimens only), is one component of a comprehensive MRSA colonization  surveillance program. It is not intended to diagnose MRSA infection nor to guide or monitor treatment  for MRSA infections. Test performance is not FDA approved in patients less than 72 years old. Performed at [facility], [address] [place], [place] [address]   Respiratory (~20 pathogens) panel by PCR     Status: None   Collection Time: 07/30/23 10:54 AM   Specimen: Nasopharyngeal Swab; Respiratory  Result Value Ref Range Status   Adenovirus NOT DETECTED NOT DETECTED Final   Coronavirus 229E NOT DETECTED NOT DETECTED Final    Comment: (NOTE) The Coronavirus on the Respiratory Panel, DOES NOT test for the novel  Coronavirus (2019 nCoV)    Coronavirus HKU1 NOT DETECTED NOT DETECTED Final   Coronavirus NL63 NOT DETECTED NOT DETECTED Final   Coronavirus OC43 NOT DETECTED NOT DETECTED Final   Metapneumovirus NOT DETECTED NOT DETECTED Final   Rhinovirus / Enterovirus NOT DETECTED NOT DETECTED Final   Influenza A NOT DETECTED NOT DETECTED Final   Influenza B NOT DETECTED NOT DETECTED Final   Parainfluenza Virus 1 NOT DETECTED NOT DETECTED Final   Parainfluenza Virus 2 NOT DETECTED NOT DETECTED Final   Parainfluenza Virus 3 NOT DETECTED NOT DETECTED Final   Parainfluenza Virus 4 NOT DETECTED NOT DETECTED Final   Respiratory Syncytial Virus NOT DETECTED NOT DETECTED Final   Bordetella pertussis NOT DETECTED NOT DETECTED Final   Bordetella Parapertussis NOT DETECTED NOT DETECTED Final   Chlamydophila pneumoniae NOT DETECTED NOT DETECTED Final   Mycoplasma pneumoniae NOT DETECTED NOT DETECTED Final    Comment: Performed at [facility], [address] [place], [place] [address]  Resp panel by RT-PCR (RSV, Flu A&B, Covid)     Status: Abnormal   Collection Time: 07/30/23 10:54 AM  Result Value Ref Range Status   SARS Coronavirus 2 by RT PCR POSITIVE (A) NEGATIVE Final    Comment: (NOTE) SARS-CoV-2 target nucleic acids are DETECTED.  The SARS-CoV-2 RNA is generally  detectable in upper respiratory specimens during the acute phase of infection. Positive results are indicative of the presence of the identified virus, but do not rule out bacterial infection or co-infection with other pathogens not detected by the test. Clinical correlation with patient history and other diagnostic information is necessary to determine patient infection status. The expected result is Negative.  Fact Sheet for Patients: BloggerCourse.com  Fact Sheet for Healthcare Providers: SeriousBroker.it  This test is not yet approved or cleared by the [place]  FDA and  has been authorized for detection and/or diagnosis of SARS-CoV-2 by FDA under an Emergency Use Authorization (EUA).  This EUA will remain in effect (meaning this test can be used) for the duration of  the COVID-19 declaration under Section 564(b)(1) of the A ct, 21 U.S.C. section 360bbb-3(b)(1), unless the authorization is terminated or revoked sooner.     Influenza A by PCR NEGATIVE NEGATIVE Final   Influenza B by PCR NEGATIVE NEGATIVE Final    Comment: (NOTE) The Xpert Xpress SARS-CoV-2/FLU/RSV plus assay is intended as an aid in the diagnosis of influenza from Nasopharyngeal swab specimens and should not be used as a sole basis for treatment. Nasal washings and aspirates are unacceptable for Xpert Xpress SARS-CoV-2/FLU/RSV testing.  Fact Sheet for Patients: BloggerCourse.com  Fact Sheet for Healthcare Providers: SeriousBroker.it  This test is not yet approved or cleared by the [place]  FDA and has been authorized for detection and/or diagnosis of SARS-CoV-2 by FDA under an Emergency Use Authorization (EUA). This EUA will remain in effect (meaning this test can be used) for the duration of the COVID-19  declaration under Section 564(b)(1) of the Act, 21 U.S.C. section 360bbb-3(b)(1), unless the  authorization is terminated or revoked.     Resp Syncytial Virus by PCR NEGATIVE NEGATIVE Final    Comment: (NOTE) Fact Sheet for Patients: BloggerCourse.com  Fact Sheet for Healthcare Providers: SeriousBroker.it  This test is not yet approved or cleared by the [place]  FDA and has been authorized for detection and/or diagnosis of SARS-CoV-2 by FDA under an Emergency Use Authorization (EUA). This EUA will remain in effect (meaning this test can be used) for the duration of the COVID-19 declaration under Section 564(b)(1) of the Act, 21 U.S.C. section 360bbb-3(b)(1), unless the authorization is terminated or revoked.  Performed at [facility], [address] [place], [place] [address]      Radiology Studies: DG Abd 1 View Result Date: 07/31/2023 CLINICAL DATA:  Nausea and vomiting. EXAM: ABDOMEN - 1 VIEW COMPARISON:  December 28, 2020. FINDINGS: The bowel gas pattern is normal. No radio-opaque calculi or other significant radiographic abnormality are seen. Postsurgical changes are seen involving the lower lumbar spine. IMPRESSION: No abnormal bowel dilatation. Electronically Signed   By: Lynwood Landy Raddle M.D.   On: 07/31/2023 13:59   DG Chest Port 1 View Result Date: 07/31/2023 CLINICAL DATA:  Nausea and vomiting. EXAM: PORTABLE CHEST 1 VIEW COMPARISON:  Chest radiograph dated 07/29/2023. FINDINGS: There is vascular congestion. Bilateral interstitial densities may represent edema but concerning for pneumonia and progressed since the prior radiograph. Small bilateral pleural effusions. No pneumothorax. Stable cardiac silhouette. No acute osseous pathology. IMPRESSION: 1. Vascular congestion with bilateral interstitial densities concerning for pneumonia. 2. Small bilateral pleural effusions. Electronically Signed   By: Vanetta Chou M.D.   On: 07/31/2023 13:56   MR BRAIN WO CONTRAST Result Date: 07/30/2023 CLINICAL  DATA:  right sided weakness, history stroke EXAM: MRI HEAD WITHOUT CONTRAST TECHNIQUE: Multiplanar, multiecho pulse sequences of the brain and surrounding structures were obtained without intravenous contrast. COMPARISON:  CT of the head dated July 29, 2023 and report of an MRI of the head dated December 03, 2000. FINDINGS: Brain: There is moderate diffuse cerebral and cerebellar volume loss present. There are chronic focal encephalomalacia changes within the right occipital lobe. There are confluent areas of increased T2 signal present in the periventricular and deep cerebral white matter. Four Vascular: Normal vascular flow voids. Skull and upper cervical spine: Normal marrow signal. No osseous lesions are present. Sinuses/Orbits: Status post bilateral lens surgery. The paranasal sinuses are clear. Other: None. IMPRESSION: 1. Focal encephalomalacia changes within the right occipital lobe from remote cortical infarct. Moderate periventricular and deep cerebral white matter disease. No apparent acute process. Electronically Signed   By: Evalene Coho M.D.   On: 07/30/2023 18:16    Scheduled Meds:  aspirin   81 mg Oral Daily   atorvastatin   80 mg Oral QHS   calcium  carbonate  1 tablet Oral BID WC   cholecalciferol  2,000 Units Oral Daily   dexamethasone  10 mg Oral Daily   dextromethorphan-guaiFENesin   1 tablet Oral BID   feeding supplement  237 mL Oral BID BM   ferrous sulfate  325 mg Oral BID WC   FLUoxetine   40 mg Oral Daily   fluticasone  1 spray Each Nare Daily   gabapentin   1,200 mg Oral QHS   lamoTRIgine   150 mg Oral QHS   magnesium oxide  400 mg Oral Daily   montelukast   10 mg Oral Daily   olopatadine   1 drop Both  Eyes BID   mouth rinse  15 mL Mouth Rinse 4 times per day   pneumococcal 20-valent conjugate vaccine  0.5 mL Intramuscular Tomorrow-1000   rOPINIRole   2 mg Oral QHS   scopolamine  1 patch Transdermal Q72H   Warfarin - Pharmacist Dosing Inpatient   Does not apply q1600    Continuous Infusions:  ampicillin -sulbactam (UNASYN ) IV 3 g (08/01/23 1232)   azithromycin Stopped (07/31/23 1858)     LOS: 3 days  MDM: Patient is high risk for one or more organ failure.  They necessitate ongoing hospitalization for continued IV therapies and subsequent lab monitoring. Total time spent interpreting labs and vitals, reviewing the medical record, coordinating care amongst consultants and care team members, directly assessing and discussing care with the patient and/or family: 55 min  Janita Camberos, DO [facility]  To contact the attending physician between 7A-7P please use Epic Chat. To contact the covering physician during after hours 7P-7A, please review Amion.  08/01/2023, 5:22 PM   *This document has been created with the assistance of dictation software. Please excuse typographical errors. *

## 2023-08-01 NOTE — Plan of Care (Signed)
  Problem: Education: Goal: Knowledge of General Education information will improve Description: Including pain rating scale, medication(s)/side effects and non-pharmacologic comfort measures Outcome: Progressing   Problem: Health Behavior/Discharge Planning: Goal: Ability to manage health-related needs will improve Outcome: Progressing   Problem: Clinical Measurements: Goal: Ability to maintain clinical measurements within normal limits will improve Outcome: Progressing Goal: Will remain free from infection Outcome: Progressing Goal: Diagnostic test results will improve Outcome: Progressing Goal: Respiratory complications will improve Outcome: Progressing Goal: Cardiovascular complication will be avoided Outcome: Progressing   Problem: Activity: Goal: Risk for activity intolerance will decrease Outcome: Progressing   Problem: Nutrition: Goal: Adequate nutrition will be maintained Outcome: Progressing   Problem: Coping: Goal: Level of anxiety will decrease Outcome: Progressing   Problem: Elimination: Goal: Will not experience complications related to bowel motility Outcome: Progressing Goal: Will not experience complications related to urinary retention Outcome: Progressing   Problem: Pain Managment: Goal: General experience of comfort will improve and/or be controlled Outcome: Progressing   Problem: Safety: Goal: Ability to remain free from injury will improve Outcome: Progressing   Problem: Skin Integrity: Goal: Risk for impaired skin integrity will decrease Outcome: Progressing   Problem: Activity: Goal: Ability to tolerate increased activity will improve Outcome: Progressing   Problem: Clinical Measurements: Goal: Ability to maintain a body temperature in the normal range will improve Outcome: Progressing   Problem: Respiratory: Goal: Ability to maintain adequate ventilation will improve Outcome: Progressing Goal: Ability to maintain a clear airway  will improve Outcome: Progressing   Problem: Fluid Volume: Goal: Hemodynamic stability will improve Outcome: Progressing   Problem: Clinical Measurements: Goal: Diagnostic test results will improve Outcome: Progressing Goal: Signs and symptoms of infection will decrease Outcome: Progressing   Problem: Respiratory: Goal: Ability to maintain adequate ventilation will improve Outcome: Progressing   Problem: Education: Goal: Knowledge of risk factors and measures for prevention of condition will improve Outcome: Progressing   Problem: Coping: Goal: Psychosocial and spiritual needs will be supported Outcome: Progressing   Problem: Respiratory: Goal: Will maintain a patent airway Outcome: Progressing Goal: Complications related to the disease process, condition or treatment will be avoided or minimized Outcome: Progressing

## 2023-08-01 NOTE — Care Management Important Message (Signed)
 Important Message  Patient Details  Name: Kenneth Summers MRN: [id number] Date of Birth: 08/17/1945   Important Message Given:  Yes - Medicare IM     Izaha Shughart W, CMA 08/01/2023, 2:28 PM

## 2023-08-01 NOTE — Progress Notes (Signed)
 OT Cancellation Note  Patient Details Name: Kenneth Summers MRN: [id number] DOB: 12-05-1945   Cancelled Treatment:    Reason Eval/Treat Not Completed: OT screened, no needs identified, will sign off. Duplicate orders received. Pt evaluated by OT this admission and signed off as pt is at baseline - dependent for ADLs and hoyer lift for mobility with 24/7 family + aid support. Please see 07/30/23 evaluation for recommendations. Thank you.    Jeanne Terrance, M.S. OTR/L  08/01/23, 12:12 PM  ascom [phone]

## 2023-08-01 NOTE — Progress Notes (Addendum)
 Daily Progress Note   Patient Name: Kenneth Summers       Date: 08/01/2023 DOB: [birth year]  Age: 78 y.o. MRN#: [id number] Attending Physician: Leesa Kast, DO Primary Care Physician: Steva Clotilda DEL, NP Admit Date: 07/29/2023  Reason for Consultation/Follow-up: Establishing goals of care  Subjective: Notes and labs reviewed.  Into see patient.  Patient is resting in bed with eyes closed throughout my visit.  Daughter is at bedside.  Daughter discusses that her mother came to visit patient yesterday and was encouraging him to hurry up and get better so that he could come home.  She states she is unsure if her brother will come to [place] .  Patient confirms that she is healthcare power of attorney.  She discusses her father's status and not wanting to lose him, but not wanting him to suffer.  She discusses that her father would never want to live in a nursing facility and states that quality of life is important.  She states his quality of life because of physical impairment has been his ability to eat and drink as he wishes.  She states the idea of restricting what he is able to eat and drink does not seem to be what he would want.  She states she is considering home with hospice.  Discussed his diagnoses, and medical management up to this point.  Discussed care moving forward.Discussed the dying process and expectations.  She discusses speaking with her family making decisions moving forward.  Attending into bedside to talk with patient and family.  Conversation recapped and PMT stepped out.  Length of Stay: 3  Current Medications: Scheduled Meds:   aspirin   81 mg Oral Daily   atorvastatin   80 mg Oral QHS   calcium  carbonate  1 tablet Oral BID WC   cholecalciferol  2,000 Units  Oral Daily   dexamethasone  10 mg Oral Daily   dextromethorphan-guaiFENesin   1 tablet Oral BID   feeding supplement  237 mL Oral BID BM   ferrous sulfate  325 mg Oral BID WC   FLUoxetine   40 mg Oral Daily   fluticasone  1 spray Each Nare Daily   gabapentin   1,200 mg Oral QHS   lamoTRIgine   150 mg Oral QHS   magnesium oxide  400 mg Oral Daily   montelukast   10 mg Oral Daily   olopatadine   1 drop Both Eyes BID   mouth rinse  15 mL Mouth Rinse 4 times per day   pneumococcal 20-valent conjugate vaccine  0.5 mL Intramuscular Tomorrow-1000   rOPINIRole   2 mg Oral QHS   scopolamine  1 patch Transdermal Q72H   Warfarin - Pharmacist Dosing Inpatient   Does not apply q1600    Continuous Infusions:  ampicillin -sulbactam (UNASYN ) IV 3 g (08/01/23 1232)   azithromycin Stopped (07/31/23 1858)    PRN Meds: acetaminophen  **OR** acetaminophen , albuterol , diphenoxylate-atropine, HYDROcodone-acetaminophen , ondansetron  **OR** ondansetron  (ZOFRAN ) IV, mouth rinse  Physical Exam Constitutional:      Comments: Eyes closed  Pulmonary:     Effort: Pulmonary effort is normal.             Vital Signs: BP (!) 154/89 (BP Location: Left Arm)   Pulse 68   Temp 98.3 F (36.8 C)   Resp 17   Ht 6' 5 (1.956 m)   Wt 120.5 kg   SpO2 95%   BMI 31.50 kg/m  SpO2: SpO2: 95 % O2 Device: O2 Device: Room Air O2 Flow Rate: O2 Flow Rate (L/min): 2 L/min  Intake/output summary:  Intake/Output Summary (Last 24 hours) at 08/01/2023 1336 Last data filed at 08/01/2023 9388 Gross per 24 hour  Intake 553.83 ml  Output 600 ml  Net -46.17 ml   LBM: Last BM Date : 08/01/23 Baseline Weight: Weight: 120.8 kg Most recent weight: Weight: 120.5 kg         Patient Active Problem List   Diagnosis Date Noted   Pneumonia due to COVID-19 virus 07/31/2023   Sepsis due to pneumonia (HCC) 07/29/2023   Elevated troponin 07/29/2023   Acute respiratory failure with hypoxia (HCC) 07/29/2023   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) 10/02/2022   Pressure injury of skin 12/28/2020   Lethargy    Diarrhea    Seizure disorder (HCC)    Acute colitis 12/26/2020   Severe sepsis (HCC) 12/26/2020   APS (antiphospholipid syndrome) (HCC)    Cough    Acute renal failure superimposed on stage 3a chronic kidney disease (HCC)    Stroke (HCC)    Irritant contact dermatitis due to fecal, urinary or dual incontinence 10/21/2020   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/01/2020   Radiculopathy, lumbosacral region 09/07/2020   Unspecified urinary incontinence 09/07/2020   Pressure injury of right buttock, stage 2 (HCC) 06/16/2020   Sepsis (HCC) 10/18/2018   Long term (current) use of anticoagulants 05/01/2018   History of stroke 04/11/2018   Cerebrovascular accident (CVA) due to occlusion of right posterior cerebral artery (HCC) 10/24/2017   Hyperlipidemia 07/04/2017   Chronic diarrhea 06/12/2017   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) 04/27/2016   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   Visual changes 06/19/2013   Anterolisthesis 01/16/2013   Cervical spinal stenosis 01/16/2013   Weakness 01/15/2013   Benign prostatic hyperplasia 01/07/2013   Depression 01/07/2013   Restless legs syndrome 01/07/2013   Vitamin D deficiency 07/26/2012   Chronic kidney disease, stage 3 unspecified (HCC) 12/20/2011   CAD (coronary artery disease) 11/03/2010   Essential hypertension 11/03/2010   PAD (peripheral artery disease) (HCC) 11/03/2010   SLE (systemic lupus erythematosus) (HCC) 11/03/2010    Palliative Care Assessment & Plan     Recommendations/Plan: Daughter considering care moving forward.  Considering hospice care.  Code Status:    Code Status Orders  (  From admission, onward)           Start     Ordered   07/29/23 1554  Do not attempt resuscitation (DNR)- Limited -Do Not Intubate (DNI)  (Code Status)  Continuous        Question Answer Comment  If pulseless and not breathing No CPR or chest compressions.   In Pre-Arrest Conditions (Patient Is Breathing and Has A Pulse) Do not intubate. Provide all appropriate non-invasive medical interventions. Avoid ICU transfer unless indicated or required.   Consent: Discussion documented in EHR or advanced directives reviewed      07/29/23 1553           Code Status History     Date Active Date Inactive Code Status Order ID Comments User Context   07/29/2023 1552 07/29/2023 1553 Full Code [id number]  Celinda Alm Lot, MD ED   12/26/2020 1601 12/30/2020 1631 DNR [id number]  Hilma Rankins, MD ED   12/26/2020 1502 12/26/2020 1601 Full Code [id number]  Hilma Rankins, MD ED   10/18/2018 1549 10/21/2018 1755 DNR [id number]  Runell Redia PARAS, MD ED   10/18/2018 1548 10/18/2018 1549 Full Code [id number]  Runell Redia PARAS, MD ED       Thank you for allowing the Palliative Medicine Team to assist in the care   Camelia Lewis, NP  Please contact Palliative Medicine Team phone at [phone] for questions and concerns.

## 2023-08-02 DIAGNOSIS — D6861 Antiphospholipid syndrome: Secondary | ICD-10-CM

## 2023-08-02 DIAGNOSIS — N1831 Chronic kidney disease, stage 3a: Secondary | ICD-10-CM | POA: Diagnosis not present

## 2023-08-02 DIAGNOSIS — J9601 Acute respiratory failure with hypoxia: Secondary | ICD-10-CM

## 2023-08-02 DIAGNOSIS — I739 Peripheral vascular disease, unspecified: Secondary | ICD-10-CM

## 2023-08-02 DIAGNOSIS — M329 Systemic lupus erythematosus, unspecified: Secondary | ICD-10-CM

## 2023-08-02 DIAGNOSIS — J1282 Pneumonia due to coronavirus disease 2019: Secondary | ICD-10-CM | POA: Diagnosis not present

## 2023-08-02 DIAGNOSIS — U071 COVID-19: Secondary | ICD-10-CM | POA: Diagnosis not present

## 2023-08-02 DIAGNOSIS — Z8673 Personal history of transient ischemic attack (TIA), and cerebral infarction without residual deficits: Secondary | ICD-10-CM

## 2023-08-02 DIAGNOSIS — G40909 Epilepsy, unspecified, not intractable, without status epilepticus: Secondary | ICD-10-CM

## 2023-08-02 DIAGNOSIS — N401 Enlarged prostate with lower urinary tract symptoms: Secondary | ICD-10-CM | POA: Diagnosis not present

## 2023-08-02 DIAGNOSIS — Z515 Encounter for palliative care: Secondary | ICD-10-CM

## 2023-08-02 DIAGNOSIS — R7989 Other specified abnormal findings of blood chemistry: Secondary | ICD-10-CM

## 2023-08-02 DIAGNOSIS — E785 Hyperlipidemia, unspecified: Secondary | ICD-10-CM

## 2023-08-02 DIAGNOSIS — Z7901 Long term (current) use of anticoagulants: Secondary | ICD-10-CM

## 2023-08-02 DIAGNOSIS — A419 Sepsis, unspecified organism: Secondary | ICD-10-CM | POA: Diagnosis not present

## 2023-08-02 DIAGNOSIS — L8990 Pressure ulcer of unspecified site, unspecified stage: Secondary | ICD-10-CM

## 2023-08-02 DIAGNOSIS — I63531 Cerebral infarction due to unspecified occlusion or stenosis of right posterior cerebral artery: Secondary | ICD-10-CM

## 2023-08-02 DIAGNOSIS — Z789 Other specified health status: Secondary | ICD-10-CM | POA: Diagnosis not present

## 2023-08-02 DIAGNOSIS — I10 Essential (primary) hypertension: Secondary | ICD-10-CM

## 2023-08-02 DIAGNOSIS — F32A Depression, unspecified: Secondary | ICD-10-CM

## 2023-08-02 LAB — CBC
HCT: 39.8 % (ref 39.0–52.0)
Hemoglobin: 12.8 g/dL — ABNORMAL LOW (ref 13.0–17.0)
MCH: 31.3 pg (ref 26.0–34.0)
MCHC: 32.2 g/dL (ref 30.0–36.0)
MCV: 97.3 fL (ref 80.0–100.0)
Platelets: 221 10*3/uL (ref 150–400)
RBC: 4.09 MIL/uL — ABNORMAL LOW (ref 4.22–5.81)
RDW: 14.1 % (ref 11.5–15.5)
WBC: 16.1 10*3/uL — ABNORMAL HIGH (ref 4.0–10.5)
nRBC: 0.1 % (ref 0.0–0.2)

## 2023-08-02 LAB — BASIC METABOLIC PANEL WITH GFR
Anion gap: 6 (ref 5–15)
BUN: 39 mg/dL — ABNORMAL HIGH (ref 8–23)
CO2: 25 mmol/L (ref 22–32)
Calcium: 8.3 mg/dL — ABNORMAL LOW (ref 8.9–10.3)
Chloride: 108 mmol/L (ref 98–111)
Creatinine, Ser: 1.09 mg/dL (ref 0.61–1.24)
GFR, Estimated: >60 mL/min (ref >60)
Glucose, Bld: 156 mg/dL — ABNORMAL HIGH (ref 70–99)
Potassium: 4.5 mmol/L (ref 3.5–5.1)
Sodium: 139 mmol/L (ref 135–145)

## 2023-08-02 LAB — PROTIME-INR
INR: 5.9 (ref 0.8–1.2)
Prothrombin Time: 55.3 s — ABNORMAL HIGH (ref 11.4–15.2)

## 2023-08-02 MED ORDER — MORPHINE BOLUS VIA INFUSION
4.0000 mg | INTRAVENOUS | Status: DC | PRN
  Administered 2023-08-02 (×2): 4 mg via INTRAVENOUS

## 2023-08-02 MED ORDER — LORAZEPAM 2 MG/ML IJ SOLN
1.0000 mg | INTRAMUSCULAR | Status: DC | PRN
  Filled 2023-08-02: qty 1

## 2023-08-02 MED ORDER — MORPHINE 100MG IN NS 100ML (1MG/ML) PREMIX INFUSION
2.0000 mg/h | INTRAVENOUS | Status: DC
  Administered 2023-08-02: 2 mg/h via INTRAVENOUS
  Filled 2023-08-02: qty 100

## 2023-08-02 MED ORDER — LORAZEPAM 2 MG/ML IJ SOLN
2.0000 mg | Freq: Once | INTRAMUSCULAR | Status: AC
  Administered 2023-08-02: 2 mg via INTRAVENOUS

## 2023-08-03 LAB — CULTURE, BLOOD (ROUTINE X 2)
Culture: NO GROWTH
Culture: NO GROWTH

## 2023-08-07 NOTE — Progress Notes (Signed)
 INR 5.9, Delayne Solian MD made aware, pt is comfort care

## 2023-08-07 NOTE — Progress Notes (Signed)
   07/25/2023 1240  Spiritual Encounters  Type of Visit Follow up  Care provided to: Pt and family (Daughter and her husband, Pt's wife, Pt's sister and husband, grandchildren and their spouses, homecare nurse and other relatives were at bedside)  Conversation partners present during encounter Nurse  Referral source Nurse (RN/NT/LPN)  Reason for visit End-of-life  OnCall Visit No  Spiritual Framework  Presenting Themes Coping tools;Impactful experiences and emotions;Rituals and practive;Values and beliefs  Interventions  Spiritual Care Interventions Made Established relationship of care and support;Compassionate presence;Reflective listening;Narrative/life review;Explored values/beliefs/practices/strengths;Bereavement/grief support;Other (comment);Provided grief education;Supported Hydrographic surveyor (for Family)  Intervention Outcomes  Outcomes Connection to spiritual care;Awareness around self/spiritual resourses;Autonomy/agency;Awareness of support;Reduced anxiety;Other (comment) (For Family)

## 2023-08-07 NOTE — Progress Notes (Addendum)
   07/31/2023 1300  Spiritual Encounters  Type of Visit Follow up  Care provided to: Pt and family (Daughter at bedside; Pt moving to comfort measures)  Conversation partners present during encounter Nurse  Referral source Physician  Reason for visit End-of-life  OnCall Visit No  Spiritual Framework  Presenting Themes Meaning/purpose/sources of inspiration;Values and beliefs;Coping tools;Impactful experiences and emotions;Rituals and practive;Other (comment) (For Daughter)  Interventions  Spiritual Care Interventions Made Established relationship of care and support;Compassionate presence;Reflective listening;Normalization of emotions;Decision-making support/facilitation;Explored values/beliefs/practices/strengths;Supported Hydrographic surveyor;Other (comment) (Daughter was worried re:Father's ideas of heaven;Reminded her that salvation only requires faith in Jesus' death and resurrection and her dad would just get a wonderful surprise when he does get to heaven because he did believe in [place].)  Intervention Outcomes  Outcomes Connection to spiritual care;Awareness around self/spiritual resourses;Awareness of support;Reduced anxiety;Reduced fear;Other (comment) (Daughter was comforted after our discussions.)   Chaplain note put in the incorrect time; First visit was at 0915.

## 2023-08-07 NOTE — Death Summary Note (Signed)
 DEATH SUMMARY   Patient Details  Name: Kenneth Summers MRN: [id number] DOB: [birth year] ERE:Rntjmi, Clotilda DEL, NP Admission/Discharge Information   Admit Date:  2023/08/19  Date of Death: Date of Death: 08/23/2023  Time of Death: Time of Death: 1257  Length of Stay: 4   Principle Cause of death: Sepsis, Multifocal Pneumonia, Covid, Acute Hypoxic Respiratory Failure  Hospital Diagnoses: Principal Problem:   Pneumonia due to COVID-19 virus Active Problems:   CAD (coronary artery disease)   Benign prostatic hyperplasia   Chronic kidney disease, stage 3 unspecified (HCC)   Depression   Hyperlipidemia   Essential hypertension   History of stroke   Long term (current) use of anticoagulants   PAD (peripheral artery disease) (HCC)   SLE (systemic lupus erythematosus) (HCC)   Cerebrovascular accident (CVA) due to occlusion of right posterior cerebral artery (HCC)   APS (antiphospholipid syndrome) (HCC)   Seizure disorder (HCC)   Pressure injury of skin   Sepsis due to pneumonia (HCC)   Elevated troponin   Acute respiratory failure with hypoxia (HCC)   Medical orders for scope of treatment form in chart   Hospital Course: Kenneth Summers was a 78 year old male with prior CVA, bedbound, antiphospholipid syndrome, history of basal cell carcinoma, BPH, CAD, chronic osteomyelitis, cervical spinal stenosis, CKD, depression, GERD, hyperlipidemia, hypertension, history of alcohol abuse, IBS, IgA deficiency, insomnia, lumbosacral radiculopathy, OSA, osteomyelitis of the foot, PAD, rheumatoid arthritis, restless leg syndrome, SLE,  who was brought in from home with sepsis and respiratory distress.  He was initially 80% on room air and subsequently placed on a nonrebreather. Patient tested positive for COVID-19.  Multifocal pneumonia seen on CT.  There was also concern of aspiration pneumonia as patient was found with vomit on his pillow at home.  He was started on Unasyn  and Decadron.  He was  seen by SLP who cleared him for a dysphagia 2 diet. Patient was found to be encephalopathic beyond his baseline. CT head and brain MRI without acute findings. Upon further discussion with palliative care, patient's daughter, Kenneth Summers, reports that his quality of life is poor and has been declining.  On 6/25 she, and her mother, elected to make the patient comfort measures only. On Aug 23, 2023 the patient was pronounced deceased at 1257.  He was surrounded by family.   Comfort measures only - Patient has suffered considerably since his CVA 6 years ago. Baseline: bedbound and uses a Nurse, adult for transfers.  Quality of life has been gradually declining.    Acute hypoxic respiratory failure COVID-pneumonia Aspiration pneumonia - CT scan with multifocal pneumonia.  Concern for possible aspiration event given vomitus seen on pillow prior to arrival - Was on Unasyn  and decadron  Sepsis - Criteria: Elevated lactic acid, febrile, hypoxia, source pneumonia Aspiration - SLP has cleared patient for dysphagia to diet Elevated troponins - No chest pain or ischemic changes.  Likely demand given sepsis and hypoxia as above - No regional wall motion abnormalities on TTE History of CVA Acute metabolic encephalopathy - At baseline has left-sided hemiparesis, mostly bedbound.  Head CT and brain MRI without acute intracranial process Seizure disorder Hypertension  Antiphospholipid syndrome SLE Restless leg MDD Neuropathy BMI 31         Procedures: n/a  Consultations: n/a  The results of significant diagnostics from this hospitalization (including imaging, microbiology, ancillary and laboratory) are listed below for reference.   Significant Diagnostic Studies: DG Abd 1 View Result Date: 07/31/2023 CLINICAL DATA:  Nausea  and vomiting. EXAM: ABDOMEN - 1 VIEW COMPARISON:  December 28, 2020. FINDINGS: The bowel gas pattern is normal. No radio-opaque calculi or other significant radiographic abnormality  are seen. Postsurgical changes are seen involving the lower lumbar spine. IMPRESSION: No abnormal bowel dilatation. Electronically Signed   By: Lynwood Landy Raddle M.D.   On: 07/31/2023 13:59   DG Chest Port 1 View Result Date: 07/31/2023 CLINICAL DATA:  Nausea and vomiting. EXAM: PORTABLE CHEST 1 VIEW COMPARISON:  Chest radiograph dated 07/29/2023. FINDINGS: There is vascular congestion. Bilateral interstitial densities may represent edema but concerning for pneumonia and progressed since the prior radiograph. Small bilateral pleural effusions. No pneumothorax. Stable cardiac silhouette. No acute osseous pathology. IMPRESSION: 1. Vascular congestion with bilateral interstitial densities concerning for pneumonia. 2. Small bilateral pleural effusions. Electronically Signed   By: Vanetta Chou M.D.   On: 07/31/2023 13:56   MR BRAIN WO CONTRAST Result Date: 07/30/2023 CLINICAL DATA:  right sided weakness, history stroke EXAM: MRI HEAD WITHOUT CONTRAST TECHNIQUE: Multiplanar, multiecho pulse sequences of the brain and surrounding structures were obtained without intravenous contrast. COMPARISON:  CT of the head dated July 29, 2023 and report of an MRI of the head dated December 03, 2000. FINDINGS: Brain: There is moderate diffuse cerebral and cerebellar volume loss present. There are chronic focal encephalomalacia changes within the right occipital lobe. There are confluent areas of increased T2 signal present in the periventricular and deep cerebral white matter. Four Vascular: Normal vascular flow voids. Skull and upper cervical spine: Normal marrow signal. No osseous lesions are present. Sinuses/Orbits: Status post bilateral lens surgery. The paranasal sinuses are clear. Other: None. IMPRESSION: 1. Focal encephalomalacia changes within the right occipital lobe from remote cortical infarct. Moderate periventricular and deep cerebral white matter disease. No apparent acute process. Electronically Signed   By:  Evalene Coho M.D.   On: 07/30/2023 18:16   ECHOCARDIOGRAM COMPLETE Result Date: 07/30/2023    ECHOCARDIOGRAM REPORT   Patient Name:   Kenneth Summers Date of Exam: 07/30/2023 Medical Rec #:  [id number]        Height:       77.0 in Accession #:    [id number]       Weight:       254.6 lb Date of Birth:  1945-10-04       BSA:          2.479 m Patient Age:    77 years         BP:           117/53 mmHg Patient Gender: M                HR:           75 bpm. Exam Location:  [facility] Procedure: 2D Echo, Cardiac Doppler and Color Doppler (Both Spectral and Color            Flow Doppler were utilized during procedure). Indications:     Abnormal ECG R94.31                  Elevated troponin  History:         Patient has no prior history of Echocardiogram examinations.                  Risk Factors:Hypertension.  Sonographer:     Christopher Furnace Referring Phys:  [id number] DAVID MANUEL ORTIZ Diagnosing Phys: Deatrice Cage MD IMPRESSIONS  1. Left ventricular ejection fraction, by estimation, is 50  to 55%. The left ventricle has low normal function. The left ventricle has no regional wall motion abnormalities. There is moderate left ventricular hypertrophy. Left ventricular diastolic parameters are indeterminate.  2. Right ventricular systolic function is normal. The right ventricular size is normal. Tricuspid regurgitation signal is inadequate for assessing PA pressure.  3. The mitral valve is normal in structure. Mild to moderate mitral valve regurgitation. No evidence of mitral stenosis.  4. The aortic valve is normal in structure. Aortic valve regurgitation is not visualized. Aortic valve sclerosis/calcification is present, without any evidence of aortic stenosis.  5. The inferior vena cava is normal in size with greater than 50% respiratory variability, suggesting right atrial pressure of 3 mmHg. FINDINGS  Left Ventricle: Left ventricular ejection fraction, by estimation, is 50 to 55%. The left ventricle has low normal  function. The left ventricle has no regional wall motion abnormalities. The left ventricular internal cavity size was normal in size. There is moderate left ventricular hypertrophy. Left ventricular diastolic parameters are indeterminate. Right Ventricle: The right ventricular size is normal. No increase in right ventricular wall thickness. Right ventricular systolic function is normal. Tricuspid regurgitation signal is inadequate for assessing PA pressure. Left Atrium: Left atrial size was normal in size. Right Atrium: Right atrial size was normal in size. Pericardium: There is no evidence of pericardial effusion. Mitral Valve: The mitral valve is normal in structure. There is mild thickening of the mitral valve leaflet(s). There is mild calcification of the mitral valve leaflet(s). Mild to moderate mitral valve regurgitation. No evidence of mitral valve stenosis.  MV peak gradient, 5.3 mmHg. The mean mitral valve gradient is 3.0 mmHg. Tricuspid Valve: The tricuspid valve is normal in structure. Tricuspid valve regurgitation is mild . No evidence of tricuspid stenosis. Aortic Valve: The aortic valve is normal in structure. Aortic valve regurgitation is not visualized. Aortic valve sclerosis/calcification is present, without any evidence of aortic stenosis. Aortic valve mean gradient measures 6.5 mmHg. Aortic valve peak  gradient measures 10.7 mmHg. Aortic valve area, by VTI measures 2.40 cm. Pulmonic Valve: The pulmonic valve was normal in structure. Pulmonic valve regurgitation is trivial. No evidence of pulmonic stenosis. Aorta: The aortic root is normal in size and structure. Venous: The inferior vena cava is normal in size with greater than 50% respiratory variability, suggesting right atrial pressure of 3 mmHg. IAS/Shunts: No atrial level shunt detected by color flow Doppler.  LEFT VENTRICLE PLAX 2D LVIDd:         4.40 cm   Diastology LVIDs:         3.10 cm   LV e' medial:    6.31 cm/s LV PW:         1.30 cm    LV E/e' medial:  15.5 LV IVS:        1.60 cm   LV e' lateral:   6.20 cm/s LVOT diam:     2.20 cm   LV E/e' lateral: 15.8 LV SV:         67 LV SV Index:   27 LVOT Area:     3.80 cm  RIGHT VENTRICLE RV Basal diam:  3.50 cm RV Mid diam:    2.50 cm RV S prime:     14.40 cm/s TAPSE (M-mode): 2.2 cm LEFT ATRIUM           Index        RIGHT ATRIUM           Index LA diam:  3.80 cm 1.53 cm/m   RA Area:     19.20 cm LA Vol (A2C): 45.9 ml 18.52 ml/m  RA Volume:   54.70 ml  22.07 ml/m LA Vol (A4C): 45.3 ml 18.28 ml/m  AORTIC VALVE AV Area (Vmax):    1.87 cm AV Area (Vmean):   1.77 cm AV Area (VTI):     2.40 cm AV Vmax:           163.50 cm/s AV Vmean:          115.000 cm/s AV VTI:            0.280 m AV Peak Grad:      10.7 mmHg AV Mean Grad:      6.5 mmHg LVOT Vmax:         80.60 cm/s LVOT Vmean:        53.600 cm/s LVOT VTI:          0.177 m LVOT/AV VTI ratio: 0.63  AORTA Ao Root diam: 3.60 cm MITRAL VALVE               TRICUSPID VALVE MV Area (PHT): 2.37 cm    TR Peak grad:   19.4 mmHg MV Area VTI:   1.97 cm    TR Vmax:        220.00 cm/s MV Peak grad:  5.3 mmHg MV Mean grad:  3.0 mmHg    SHUNTS MV Vmax:       1.15 m/s    Systemic VTI:  0.18 m MV Vmean:      80.4 cm/s   Systemic Diam: 2.20 cm MV Decel Time: 320 msec MV E velocity: 98.00 cm/s MV A velocity: 84.40 cm/s MV E/A ratio:  1.16 Deatrice Cage MD Electronically signed by Deatrice Cage MD Signature Date/Time: 07/30/2023/3:48:48 PM    Final    CT Head Wo Contrast Result Date: 07/29/2023 CLINICAL DATA:  Provided history: Mental status change, unknown cause. EXAM: CT HEAD WITHOUT CONTRAST TECHNIQUE: Contiguous axial images were obtained from the base of the skull through the vertex without intravenous contrast. RADIATION DOSE REDUCTION: This exam was performed according to the departmental dose-optimization program which includes automated exposure control, adjustment of the mA and/or kV according to patient size and/or use of iterative reconstruction  technique. COMPARISON:  Head CT 12/28/2020. FINDINGS: Brain: Generalized cerebral atrophy. Small chronic cortically-based infarcts again demonstrated within the medial right frontal lobe and right occipital lobe. Background chronic small vessel ischemic changes within the cerebral white matter and within/about the bilateral basal ganglia, similar to the prior head CT of 12/28/2020. There is no acute intracranial hemorrhage. No acute demarcated cortical infarct. No extra-axial fluid collection. No evidence of an intracranial mass. No midline shift. Vascular: Calcific focus in the region of the left middle cerebral artery M1/M2 junction, new from the prior head CT of 12/28/2020 (for instance as seen on series 3, image 34). This may reflect calcified atherosclerotic plaque or an age-indeterminate calcified embolus. Atherosclerotic calcifications also present within the intracranial internal carotid and vertebral arteries. Skull: No calvarial fracture or aggressive osseous lesion. Sinuses/Orbits: No mass or acute finding within the imaged orbits. No significant paranasal sinus disease at the imaged levels. Impression #2 called by telephone at the time of interpretation on 07/29/2023 at 12:57 pm to provider [facility] , who verbally acknowledged these results. IMPRESSION: 1. No acute intracranial hemorrhage or evidence of an acute infarct. 2. Calcific focus in the region of the left middle cerebral artery M1/M2 junction, new  from the prior head CT of 12/28/2020. This may reflect calcified atherosclerotic plaque or an age-indeterminate calcified embolus. CT angiography may be obtained for further evaluation, as clinically warranted. 3. Background parenchymal atrophy, chronic small vessel ischemic disease and chronic infarcts, as described. Electronically Signed   By: Rockey Childs D.O.   On: 07/29/2023 13:01   CT CHEST ABDOMEN PELVIS W CONTRAST Result Date: 07/29/2023 CLINICAL DATA:  Respiratory distress associated  with emesis and diffuse weakness EXAM: CT CHEST, ABDOMEN, AND PELVIS WITH CONTRAST TECHNIQUE: Multidetector CT imaging of the chest, abdomen and pelvis was performed following the standard protocol during bolus administration of intravenous contrast. RADIATION DOSE REDUCTION: This exam was performed according to the departmental dose-optimization program which includes automated exposure control, adjustment of the mA and/or kV according to patient size and/or use of iterative reconstruction technique. CONTRAST:  OMNIPAQUE IOHEXOL 300 MG/ML  SOLN COMPARISON:  Same day chest radiograph, CT abdomen and pelvis dated 12/26/2020 FINDINGS: CT CHEST FINDINGS Cardiovascular: Normal heart size. No significant pericardial fluid/thickening. Great vessels are normal in course and caliber. No central pulmonary emboli. Coronary artery calcifications. Mediastinum/Nodes: Imaged thyroid gland without nodules meeting criteria for imaging follow-up by size. Normal esophagus. 10 mm subcarinal lymph node (2:51). Lungs/Pleura: The central airways are patent. Confluent patchy ground-glass opacities within the bilateral upper, dependent right middle, and right greater than left lower lobes. No focal consolidation. No pneumothorax. No pleural effusion. Musculoskeletal: Postsurgical changes of the cervical fusion at C4-C7. Grade 1 anterolisthesis at C4-5. No acute or abnormal lytic or blastic osseous lesions. Multilevel degenerative changes of the thoracic spine. CT ABDOMEN PELVIS FINDINGS Hepatobiliary: No focal hepatic lesions. No intra or extrahepatic biliary ductal dilation. Normal gallbladder. Pancreas: No focal lesions or main ductal dilation. Spleen: Normal in size without focal abnormality. Adrenals/Urinary Tract: No adrenal nodules. Minimally complicated 2.7 cm right interpolar cyst. No specific follow-up imaging recommended. No hydronephrosis. Punctate bilateral nonobstructing stones. Mildly trabeculated urinary bladder.  Stomach/Bowel: Normal appearance of the stomach. No evidence of bowel wall thickening, distention, or inflammatory changes. Small to moderate volume stool throughout the colon. Colonic diverticulosis without acute diverticulitis. Normal appendix. Vascular/Lymphatic: Aortic atherosclerosis. No enlarged abdominal or pelvic lymph nodes. Reproductive: Prostate is unremarkable. Other: Trace presacral free fluid.  No free air or fluid collection. Musculoskeletal: No acute or abnormal lytic or blastic osseous findings. Postsurgical changes of L3-S1 spinal fusion with bi iliac screws. Hardware appears intact. Small fat-containing left inguinal hernia. Mild body wall edema overlying the left flank and hips bilaterally. IMPRESSION: 1. Findings of multifocal pneumonia. 2. No acute intra-abdominal or intrapelvic abnormality. 3. Punctate bilateral nonobstructing renal stones. 4. Colonic diverticulosis without acute diverticulitis. 5. Aortic Atherosclerosis (ICD10-I70.0). Coronary artery calcifications. Assessment for potential risk factor modification, dietary therapy or pharmacologic therapy may be warranted, if clinically indicated. Electronically Signed   By: Limin  Xu M.D.   On: 07/29/2023 12:46   DG Chest Portable 1 View Result Date: 07/29/2023 CLINICAL DATA:  Hypoxia. EXAM: PORTABLE CHEST 1 VIEW COMPARISON:  12/26/2020. FINDINGS: The heart size and mediastinal contours are within normal limits. Alveolar density left mid lung could represent pneumonia or volume loss. Small bilateral pleural effusion. Pulmonary vascular congestion. Osseous structures intact. IMPRESSION: 1. Left mid lung infiltrate or volume loss. 2. Small bilateral pleural effusions. Electronically Signed   By: Fonda Field M.D.   On: 07/29/2023 11:25    Microbiology: Recent Results (from the past 240 hours)  Culture, blood (Routine x 2)     Status: None (  Preliminary result)   Collection Time: 07/29/23 10:34 AM   Specimen: BLOOD LEFT ARM   Result Value Ref Range Status   Specimen Description BLOOD LEFT ARM  Final   Special Requests   Final    BOTTLES DRAWN AEROBIC AND ANAEROBIC Blood Culture results may not be optimal due to an inadequate volume of blood received in culture bottles   Culture   Final    NO GROWTH 4 DAYS Performed at [facility], [address] [place], [place] [address]    Report Status PENDING  Incomplete  Culture, blood (Routine x 2)     Status: None (Preliminary result)   Collection Time: 07/29/23 10:34 AM   Specimen: BLOOD  Result Value Ref Range Status   Specimen Description BLOOD LEFT ANTECUBITAL  Final   Special Requests   Final    BOTTLES DRAWN AEROBIC AND ANAEROBIC Blood Culture results may not be optimal due to an inadequate volume of blood received in culture bottles   Culture   Final    NO GROWTH 4 DAYS Performed at [facility], [address] [place], [place] [address]    Report Status PENDING  Incomplete  MRSA Next Gen by PCR, Nasal     Status: None   Collection Time: 07/29/23  4:13 PM   Specimen: Nasal Mucosa; Nasal Swab  Result Value Ref Range Status   MRSA by PCR Next Gen NOT DETECTED NOT DETECTED Final    Comment: (NOTE) The GeneXpert MRSA Assay (FDA approved for NASAL specimens only), is one component of a comprehensive MRSA colonization surveillance program. It is not intended to diagnose MRSA infection nor to guide or monitor treatment for MRSA infections. Test performance is not FDA approved in patients less than 74 years old. Performed at [facility], [address] [place], [place] [address]   Respiratory (~20 pathogens) panel by PCR     Status: None   Collection Time: 07/30/23 10:54 AM   Specimen: Nasopharyngeal Swab; Respiratory  Result Value Ref Range Status   Adenovirus NOT DETECTED NOT DETECTED Final   Coronavirus 229E NOT DETECTED NOT DETECTED Final    Comment: (NOTE) The Coronavirus on the Respiratory Panel, DOES NOT test for  the novel  Coronavirus (2019 nCoV)    Coronavirus HKU1 NOT DETECTED NOT DETECTED Final   Coronavirus NL63 NOT DETECTED NOT DETECTED Final   Coronavirus OC43 NOT DETECTED NOT DETECTED Final   Metapneumovirus NOT DETECTED NOT DETECTED Final   Rhinovirus / Enterovirus NOT DETECTED NOT DETECTED Final   Influenza A NOT DETECTED NOT DETECTED Final   Influenza B NOT DETECTED NOT DETECTED Final   Parainfluenza Virus 1 NOT DETECTED NOT DETECTED Final   Parainfluenza Virus 2 NOT DETECTED NOT DETECTED Final   Parainfluenza Virus 3 NOT DETECTED NOT DETECTED Final   Parainfluenza Virus 4 NOT DETECTED NOT DETECTED Final   Respiratory Syncytial Virus NOT DETECTED NOT DETECTED Final   Bordetella pertussis NOT DETECTED NOT DETECTED Final   Bordetella Parapertussis NOT DETECTED NOT DETECTED Final   Chlamydophila pneumoniae NOT DETECTED NOT DETECTED Final   Mycoplasma pneumoniae NOT DETECTED NOT DETECTED Final    Comment: Performed at [facility], [address], [place], [place] [address]  Resp panel by RT-PCR (RSV, Flu A&B, Covid)     Status: Abnormal   Collection Time: 07/30/23 10:54 AM  Result Value Ref Range Status   SARS Coronavirus 2 by RT PCR POSITIVE (A) NEGATIVE Final    Comment: (NOTE)  SARS-CoV-2 target nucleic acids are DETECTED.  The SARS-CoV-2 RNA is generally detectable in upper respiratory specimens during the acute phase of infection. Positive results are indicative of the presence of the identified virus, but do not rule out bacterial infection or co-infection with other pathogens not detected by the test. Clinical correlation with patient history and other diagnostic information is necessary to determine patient infection status. The expected result is Negative.  Fact Sheet for Patients: BloggerCourse.com  Fact Sheet for Healthcare Providers: SeriousBroker.it  This test is not yet approved or cleared by the [place]   FDA and  has been authorized for detection and/or diagnosis of SARS-CoV-2 by FDA under an Emergency Use Authorization (EUA).  This EUA will remain in effect (meaning this test can be used) for the duration of  the COVID-19 declaration under Section 564(b)(1) of the A ct, 21 U.S.C. section 360bbb-3(b)(1), unless the authorization is terminated or revoked sooner.     Influenza A by PCR NEGATIVE NEGATIVE Final   Influenza B by PCR NEGATIVE NEGATIVE Final    Comment: (NOTE) The Xpert Xpress SARS-CoV-2/FLU/RSV plus assay is intended as an aid in the diagnosis of influenza from Nasopharyngeal swab specimens and should not be used as a sole basis for treatment. Nasal washings and aspirates are unacceptable for Xpert Xpress SARS-CoV-2/FLU/RSV testing.  Fact Sheet for Patients: BloggerCourse.com  Fact Sheet for Healthcare Providers: SeriousBroker.it  This test is not yet approved or cleared by the [place]  FDA and has been authorized for detection and/or diagnosis of SARS-CoV-2 by FDA under an Emergency Use Authorization (EUA). This EUA will remain in effect (meaning this test can be used) for the duration of the COVID-19 declaration under Section 564(b)(1) of the Act, 21 U.S.C. section 360bbb-3(b)(1), unless the authorization is terminated or revoked.     Resp Syncytial Virus by PCR NEGATIVE NEGATIVE Final    Comment: (NOTE) Fact Sheet for Patients: BloggerCourse.com  Fact Sheet for Healthcare Providers: SeriousBroker.it  This test is not yet approved or cleared by the [place]  FDA and has been authorized for detection and/or diagnosis of SARS-CoV-2 by FDA under an Emergency Use Authorization (EUA). This EUA will remain in effect (meaning this test can be used) for the duration of the COVID-19 declaration under Section 564(b)(1) of the Act, 21 U.S.C. section  360bbb-3(b)(1), unless the authorization is terminated or revoked.  Performed at [facility], [address] [place], [place] [address]     Time spent: 32 minutes  Signed: Ladan Vanderzanden, DO 08/05/2023

## 2023-08-07 NOTE — Progress Notes (Signed)
 This RN called to room at 1255 by Chaplin to report patient respirations have stopped. This RN and Joesph Gambler RN pronounced patient's death at 1257 pm. Md Dr. Leesa notified of patient death. Support given to family.

## 2023-08-07 NOTE — Consult Note (Signed)
 Pharmacy Consult Note - Anticoagulation  Pharmacy Consult for warfarin Indication: APS  PATIENT MEASUREMENTS: Height: 6' 5 (195.6 cm) Weight: 120.5 kg (265 lb 10.5 oz) IBW/kg (Calculated) : 89.1 HEPARIN DW (KG): 114.2  VITAL SIGNS: Temp: 98 F (36.7 C) (06/26 0731) Temp Source: Oral (06/25 2044) BP: 161/104 (06/26 0731) Pulse Rate: 98 (06/26 0731)  Recent Labs    07/30/23 1111 07/31/23 0320 07/28/2023 0343  HGB  --    < > 12.8*  HCT  --    < > 39.8  PLT  --    < > 221  LABPROT  --    < > 55.3*  INR  --    < > 5.9*  CREATININE  --    < > 1.09  TROPONINIHS 156*  --   --    < > = values in this interval not displayed.    Estimated Creatinine Clearance: 80.3 mL/min (by C-G formula based on SCr of 1.09 mg/dL).  PAST MEDICAL HISTORY: Past Medical History:  Diagnosis Date   Actinic keratosis    Alcohol abuse    APS (antiphospholipid syndrome) (HCC)    Basal cell carcinoma    BPH (benign prostatic hyperplasia)    CAD (coronary artery disease)    Cellulitis    Cervical spinal stenosis    Chronic osteomyelitis (HCC)    CKD (chronic kidney disease)    CKD (chronic kidney disease)    Clostridium difficile diarrhea    Depression    Diplopia    Fatigue    GERD (gastroesophageal reflux disease)    Hyperlipemia    Hypertension    Hypovitaminosis D    IBS (irritable bowel syndrome)    IgA deficiency (HCC)    Insomnia    Left lumbosacral radiculopathy    Moderate obstructive sleep apnea    Osteomyelitis of foot (HCC)    PAD (peripheral artery disease) (HCC)    Pruritus    RA (rheumatoid arthritis) (HCC)    Radiculopathy    Restless legs syndrome    RLS (restless legs syndrome)    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 male with PMH including antiphospholipid syndrome on warfarin, HTN, stroke, CAD  is presenting with sepsis secondary to pneumonia. Patient is on chronic anticoagulation using warfarin per chart review. Last dose prior to admission was yesterday, 6/21. INR is currently therapeutic. Pharmacy has been consulted to manage warfarin while patient is admitted.  Pertinent medications: Warfarin 3.75mg  PO daily Patient has 2.5mg  tablets at home   drug-drug interactions: -Azithromycin >> increased risk of bleeding -dexamethasone (steroids)  Goal(s) of therapy: INR 2 - 3 Monitor platelets by anticoagulation protocol: Yes   Baseline anticoagulation labs: Recent Labs    07/31/23 0320 08/01/23 0444 08/04/2023 0343  INR 2.5* 3.7* 5.9*  HGB 12.5* 12.2* 12.8*  PLT 122* 145* 221    Date INR Warfarin Dose 6/22 2.3 3.75 mg 6/23 2.2 3.75 mg 6/24  2.5 3.75 mg 6/25 3.7 HOLD 6/26 5.9 HOLD     Hgb 12.8  Plt 221  PLAN: INR Supratherapeutic.  Will hold warfarin again today.  Likely result of DDI w/ Azithromycin and dexamethasone Daily INR CBC at least every 3 days while on warfarin  Zivah Mayr PharmD Clinical Pharmacist 07/27/2023

## 2023-08-07 NOTE — Progress Notes (Addendum)
 Daily Progress Note   Patient Name: Kenneth Summers       Date: 07/14/2023 DOB: [birth year]  Age: 78 y.o. MRN#: [id number] Attending Physician: Leesa Kast, DO Primary Care Physician: Steva Clotilda DEL, NP Admit Date: 07/29/2023  Reason for Consultation/Follow-up: Establishing goals of care  Subjective: Notes and labs reviewed.  H POA forms are currently in patient's chart and list patient's daughter as healthcare power of attorney.  In to see patient.  He is currently resting in bed at this time, daughter and chaplain at bedside.  They discuss that family has made the decision to shift to comfort care.  Patient with eyes closed; he is not responsive and has some work of breathing using abdominal muscles. Rhonchi noted. Chaplain and daughter discuss this.   Spoke with nursing regarding patient status, current orders, and available meds for comfort. PRN IV Morphine  dosing provided as needed to patient to help with WOB; infusion order added with PRN bolus dosing. Scopolomine patch already in place. Robinol provided. PRN Ativan ordered. Remained on unit and into bedside to check on patient until comfortable. Order changes discussed in detail with nursing, and questions were answered. Charge RN updated.  Other family members arrived to bedside and we discussed expectations of care moving forward and end-of-life process.  Nurse updated.   Spoke with attending regarding updates- not stable to discharge; hospital death.  I completed a MOST form today with daughter and the signed original was placed in the chart. Each section of options on the form were reviewed in full detail and any questions were answered as needed. The form was scanned and sent to medical records for it to be uploaded under ACP  tab in Epic. A photocopy was also placed in the chart to be scanned into EMR. The patient outlined their wishes for the following treatment decisions:  Cardiopulmonary Resuscitation: Do Not Attempt Resuscitation (DNR/No CPR)  Medical Interventions: Comfort Measures: Keep clean, warm, and dry. Use medication by any route, positioning, wound care, and other measures to relieve pain and suffering. Use oxygen, suction and manual treatment of airway obstruction as needed for comfort. Do not transfer to the hospital unless comfort needs cannot be met in current location.  Antibiotics: No antibiotics (use other measures to relieve symptoms)  IV Fluids: No IV fluids (provide other measures  to ensure comfort)  Feeding Tube: No feeding tube  With  Length of Stay: 4  Current Medications: Scheduled Meds:   fluticasone  1 spray Each Nare Daily   olopatadine   1 drop Both Eyes BID   mouth rinse  15 mL Mouth Rinse 4 times per day   scopolamine  1 patch Transdermal Q72H   sodium chloride  flush  3-10 mL Intravenous Q12H    Continuous Infusions:  morphine  2 mg/hr (07/17/2023 1116)    PRN Meds: albuterol , artificial tears, glycopyrrolate **OR** glycopyrrolate **OR** glycopyrrolate, haloperidol lactate, LORazepam, morphine , ondansetron  **OR** ondansetron  (ZOFRAN ) IV, mouth rinse, sodium chloride  flush  Physical Exam Nursing note reviewed.  Constitutional:      Comments: Eyes closed  Pulmonary:     Comments: Some work of breathing noted  Skin:    General: Skin is warm and dry.             Vital Signs: BP (!) 161/104 (BP Location: Right Arm)   Pulse 98   Temp 98 F (36.7 C)   Resp 16   Ht 6' 5 (1.956 m)   Wt 120.5 kg   SpO2 91%   BMI 31.50 kg/m  SpO2: SpO2: 91 % O2 Device: O2 Device: Nasal Cannula O2 Flow Rate: O2 Flow Rate (L/min): 5 L/min  Intake/output summary:  Intake/Output Summary (Last 24 hours) at 08/03/2023 1150 Last data filed at 07/20/2023 1025 Gross per 24 hour  Intake  199.43 ml  Output --  Net 199.43 ml   LBM: Last BM Date : 08/01/23 Baseline Weight: Weight: 120.8 kg Most recent weight: Weight: 120.5 kg   Patient Active Problem List   Diagnosis Date Noted   Pneumonia due to COVID-19 virus 07/31/2023   Sepsis due to pneumonia (HCC) 07/29/2023   Elevated troponin 07/29/2023   Acute respiratory failure with hypoxia (HCC) 07/29/2023   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) 10/02/2022   Pressure injury of skin 12/28/2020   Lethargy    Diarrhea    Seizure disorder (HCC)    Acute colitis 12/26/2020   Severe sepsis (HCC) 12/26/2020   APS (antiphospholipid syndrome) (HCC)    Cough    Acute renal failure superimposed on stage 3a chronic kidney disease (HCC)    Stroke (HCC)    Irritant contact dermatitis due to fecal, urinary or dual incontinence 10/21/2020   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/01/2020   Radiculopathy, lumbosacral region 09/07/2020   Unspecified urinary incontinence 09/07/2020   Pressure injury of right buttock, stage 2 (HCC) 06/16/2020   Sepsis (HCC) 10/18/2018   Long term (current) use of anticoagulants 05/01/2018   History of stroke 04/11/2018   Cerebrovascular accident (CVA) due to occlusion of right posterior cerebral artery (HCC) 10/24/2017   Hyperlipidemia 07/04/2017   Chronic diarrhea 06/12/2017   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) 04/27/2016   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   Visual changes 06/19/2013   Anterolisthesis 01/16/2013   Cervical spinal stenosis 01/16/2013   Weakness 01/15/2013   Benign prostatic hyperplasia 01/07/2013   Depression 01/07/2013   Restless legs syndrome 01/07/2013   Vitamin D deficiency 07/26/2012   Chronic kidney disease, stage 3 unspecified (HCC) 12/20/2011   CAD (coronary artery disease) 11/03/2010   Essential hypertension 11/03/2010   PAD (peripheral artery disease) (HCC)  11/03/2010   SLE (systemic lupus erythematosus) (HCC) 11/03/2010    Palliative Care Assessment & Plan   Recommendations/Plan: Patient is in the dying  process.  Continue comfort care.  Code Status:    Code Status Orders  (From admission, onward)           Start     Ordered   08/01/23 1740  Do not attempt resuscitation (DNR) - Comfort care  Continuous       Question Answer Comment  If patient has no pulse and is not breathing Do Not Attempt Resuscitation   In Pre-Arrest Conditions (Patient Is Breathing and Has a Pulse) Provide comfort measures. Relieve any mechanical airway obstruction. Avoid transfer unless required for comfort.   Consent: Discussion documented in EHR or advanced directives reviewed      08/01/23 1740           Code Status History     Date Active Date Inactive Code Status Order ID Comments User Context   07/29/2023 1553 08/01/2023 1740 Limited: Do not attempt resuscitation (DNR) -DNR-LIMITED -Do Not Intubate/DNI  [id number]  Celinda Alm Lot, MD ED   07/29/2023 1552 07/29/2023 1553 Full Code [id number]  Celinda Alm Lot, MD ED   12/26/2020 1601 12/30/2020 1631 DNR [id number]  Hilma Rankins, MD ED   12/26/2020 1502 12/26/2020 1601 Full Code [id number]  Hilma Rankins, MD ED   10/18/2018 1549 10/21/2018 1755 DNR [id number]  Runell Redia PARAS, MD ED   10/18/2018 1548 10/18/2018 1549 Full Code [id number]  Runell Redia PARAS, MD ED       Prognosis:  Hours - Days   Care plan was discussed with attending and nursing  Thank you for allowing the Palliative Medicine Team to assist in the care of this patient.  Camelia Lewis, NP  Please contact Palliative Medicine Team phone at [phone] for questions and concerns.

## 2023-08-07 DEATH — deceased
